# Patient Record
Sex: Female | Born: 1970
Health system: Southern US, Community
[De-identification: ages and names within clinical notes are randomized; demographics above are authoritative.]

## PROBLEM LIST (undated history)

## (undated) DIAGNOSIS — M545 Low back pain, unspecified: Secondary | ICD-10-CM

## (undated) DIAGNOSIS — M51369 Other intervertebral disc degeneration, lumbar region without mention of lumbar back pain or lower extremity pain: Secondary | ICD-10-CM

## (undated) DIAGNOSIS — E785 Hyperlipidemia, unspecified: Secondary | ICD-10-CM

## (undated) DIAGNOSIS — K2270 Barrett's esophagus without dysplasia: Secondary | ICD-10-CM

## (undated) DIAGNOSIS — F329 Major depressive disorder, single episode, unspecified: Secondary | ICD-10-CM

## (undated) DIAGNOSIS — E119 Type 2 diabetes mellitus without complications: Secondary | ICD-10-CM

## (undated) DIAGNOSIS — E039 Hypothyroidism, unspecified: Secondary | ICD-10-CM

## (undated) DIAGNOSIS — F4310 Post-traumatic stress disorder, unspecified: Secondary | ICD-10-CM

## (undated) DIAGNOSIS — I2510 Atherosclerotic heart disease of native coronary artery without angina pectoris: Secondary | ICD-10-CM

## (undated) DIAGNOSIS — I469 Cardiac arrest, cause unspecified: Secondary | ICD-10-CM

## (undated) DIAGNOSIS — F32A Depression, unspecified: Secondary | ICD-10-CM

## (undated) DIAGNOSIS — M5126 Other intervertebral disc displacement, lumbar region: Secondary | ICD-10-CM

## (undated) DIAGNOSIS — F419 Anxiety disorder, unspecified: Secondary | ICD-10-CM

## (undated) DIAGNOSIS — Z765 Malingerer [conscious simulation]: Secondary | ICD-10-CM

## (undated) DIAGNOSIS — C4490 Unspecified malignant neoplasm of skin, unspecified: Secondary | ICD-10-CM

## (undated) DIAGNOSIS — G8929 Other chronic pain: Secondary | ICD-10-CM

## (undated) DIAGNOSIS — R519 Headache, unspecified: Secondary | ICD-10-CM

## (undated) DIAGNOSIS — R51 Headache: Secondary | ICD-10-CM

## (undated) DIAGNOSIS — M1990 Unspecified osteoarthritis, unspecified site: Secondary | ICD-10-CM

## (undated) DIAGNOSIS — I10 Essential (primary) hypertension: Secondary | ICD-10-CM

## (undated) DIAGNOSIS — M5030 Other cervical disc degeneration, unspecified cervical region: Secondary | ICD-10-CM

## (undated) DIAGNOSIS — M5136 Other intervertebral disc degeneration, lumbar region: Secondary | ICD-10-CM

## (undated) DIAGNOSIS — K219 Gastro-esophageal reflux disease without esophagitis: Secondary | ICD-10-CM

## (undated) HISTORY — PX: HEMORRHOID SURGERY: SHX153

---

## 1997-08-17 ENCOUNTER — Inpatient Hospital Stay (HOSPITAL_COMMUNITY): Admission: AD | Admit: 1997-08-17 | Discharge: 1997-08-21 | Payer: Self-pay | Admitting: Obstetrics and Gynecology

## 1997-08-21 ENCOUNTER — Encounter: Admission: RE | Admit: 1997-08-21 | Discharge: 1997-11-19 | Payer: Self-pay | Admitting: *Deleted

## 1999-03-31 ENCOUNTER — Ambulatory Visit (HOSPITAL_COMMUNITY): Admission: RE | Admit: 1999-03-31 | Discharge: 1999-03-31 | Payer: Self-pay | Admitting: Psychology

## 1999-03-31 ENCOUNTER — Encounter: Payer: Self-pay | Admitting: Obstetrics and Gynecology

## 1999-06-30 ENCOUNTER — Other Ambulatory Visit: Admission: RE | Admit: 1999-06-30 | Discharge: 1999-06-30 | Payer: Self-pay | Admitting: Obstetrics and Gynecology

## 1999-07-27 ENCOUNTER — Encounter: Payer: Self-pay | Admitting: Obstetrics and Gynecology

## 1999-07-27 ENCOUNTER — Ambulatory Visit (HOSPITAL_COMMUNITY): Admission: RE | Admit: 1999-07-27 | Discharge: 1999-07-27 | Payer: Self-pay | Admitting: Obstetrics and Gynecology

## 2000-02-12 ENCOUNTER — Encounter: Payer: Self-pay | Admitting: Obstetrics and Gynecology

## 2000-02-12 ENCOUNTER — Ambulatory Visit (HOSPITAL_COMMUNITY): Admission: RE | Admit: 2000-02-12 | Discharge: 2000-02-12 | Payer: Self-pay | Admitting: Obstetrics and Gynecology

## 2000-07-17 ENCOUNTER — Encounter (INDEPENDENT_AMBULATORY_CARE_PROVIDER_SITE_OTHER): Payer: Self-pay | Admitting: *Deleted

## 2000-07-17 ENCOUNTER — Inpatient Hospital Stay (HOSPITAL_COMMUNITY): Admission: AD | Admit: 2000-07-17 | Discharge: 2000-07-19 | Payer: Self-pay | Admitting: Obstetrics and Gynecology

## 2000-08-28 ENCOUNTER — Other Ambulatory Visit: Admission: RE | Admit: 2000-08-28 | Discharge: 2000-08-28 | Payer: Self-pay | Admitting: Obstetrics and Gynecology

## 2000-09-10 ENCOUNTER — Ambulatory Visit (HOSPITAL_BASED_OUTPATIENT_CLINIC_OR_DEPARTMENT_OTHER): Admission: RE | Admit: 2000-09-10 | Discharge: 2000-09-10 | Payer: Self-pay | Admitting: *Deleted

## 2000-09-11 ENCOUNTER — Emergency Department (HOSPITAL_COMMUNITY): Admission: EM | Admit: 2000-09-11 | Discharge: 2000-09-11 | Payer: Self-pay | Admitting: Emergency Medicine

## 2001-10-10 ENCOUNTER — Other Ambulatory Visit: Admission: RE | Admit: 2001-10-10 | Discharge: 2001-10-10 | Payer: Self-pay | Admitting: Obstetrics and Gynecology

## 2002-10-22 ENCOUNTER — Other Ambulatory Visit: Admission: RE | Admit: 2002-10-22 | Discharge: 2002-10-22 | Payer: Self-pay | Admitting: Obstetrics and Gynecology

## 2004-11-17 ENCOUNTER — Other Ambulatory Visit: Admission: RE | Admit: 2004-11-17 | Discharge: 2004-11-17 | Payer: Self-pay | Admitting: Obstetrics and Gynecology

## 2006-04-23 HISTORY — PX: ABLATION ON ENDOMETRIOSIS: SHX5787

## 2006-08-22 ENCOUNTER — Encounter (INDEPENDENT_AMBULATORY_CARE_PROVIDER_SITE_OTHER): Payer: Self-pay | Admitting: Specialist

## 2006-08-22 ENCOUNTER — Ambulatory Visit (HOSPITAL_COMMUNITY): Admission: RE | Admit: 2006-08-22 | Discharge: 2006-08-22 | Payer: Self-pay | Admitting: Obstetrics and Gynecology

## 2008-05-20 ENCOUNTER — Encounter
Admission: RE | Admit: 2008-05-20 | Discharge: 2008-05-21 | Payer: Self-pay | Admitting: Physical Medicine and Rehabilitation

## 2008-05-21 ENCOUNTER — Ambulatory Visit: Payer: Self-pay | Admitting: Physical Medicine and Rehabilitation

## 2009-01-31 ENCOUNTER — Emergency Department (HOSPITAL_COMMUNITY): Admission: EM | Admit: 2009-01-31 | Discharge: 2009-01-31 | Payer: Self-pay | Admitting: Emergency Medicine

## 2009-02-07 ENCOUNTER — Ambulatory Visit (HOSPITAL_COMMUNITY): Admission: RE | Admit: 2009-02-07 | Discharge: 2009-02-07 | Payer: Self-pay | Admitting: Family Medicine

## 2009-11-06 ENCOUNTER — Emergency Department (HOSPITAL_COMMUNITY): Admission: EM | Admit: 2009-11-06 | Discharge: 2009-11-07 | Payer: Self-pay | Admitting: Emergency Medicine

## 2010-05-14 ENCOUNTER — Encounter: Payer: Self-pay | Admitting: Family Medicine

## 2010-05-15 ENCOUNTER — Encounter: Payer: Self-pay | Admitting: Obstetrics and Gynecology

## 2010-07-08 LAB — URINALYSIS, ROUTINE W REFLEX MICROSCOPIC
Nitrite: NEGATIVE
Protein, ur: 30 mg/dL — AB
Urobilinogen, UA: 0.2 mg/dL (ref 0.0–1.0)

## 2010-07-08 LAB — DIFFERENTIAL
Eosinophils Absolute: 0 10*3/uL (ref 0.0–0.7)
Lymphocytes Relative: 3 % — ABNORMAL LOW (ref 12–46)
Lymphs Abs: 0.4 10*3/uL — ABNORMAL LOW (ref 0.7–4.0)
Monocytes Absolute: 0.6 10*3/uL (ref 0.1–1.0)
Neutrophils Relative %: 93 % — ABNORMAL HIGH (ref 43–77)

## 2010-07-08 LAB — URINE MICROSCOPIC-ADD ON

## 2010-07-08 LAB — CBC
MCV: 95.4 fL (ref 78.0–100.0)
RBC: 4.35 MIL/uL (ref 3.87–5.11)
RDW: 11.7 % (ref 11.5–15.5)

## 2010-07-08 LAB — COMPREHENSIVE METABOLIC PANEL
ALT: <8 U/L (ref 0–35)
AST: 43 U/L — ABNORMAL HIGH (ref 0–37)
Alkaline Phosphatase: 47 U/L (ref 39–117)
CO2: 22 mEq/L (ref 19–32)
Calcium: 10.1 mg/dL (ref 8.4–10.5)
Creatinine, Ser: 1.07 mg/dL (ref 0.4–1.2)
GFR calc Af Amer: >60 mL/min (ref >60)
Glucose, Bld: 211 mg/dL — ABNORMAL HIGH (ref 70–99)
Potassium: 3.4 mEq/L — ABNORMAL LOW (ref 3.5–5.1)
Sodium: 145 mEq/L (ref 135–145)
Total Protein: 8.2 g/dL (ref 6.0–8.3)

## 2010-07-08 LAB — SALICYLATE LEVEL: Salicylate Lvl: <4 mg/dL (ref 2.8–20.0)

## 2010-07-08 LAB — RAPID URINE DRUG SCREEN, HOSP PERFORMED: Benzodiazepines: POSITIVE — AB

## 2010-07-08 LAB — ETHANOL: Alcohol, Ethyl (B): <5 mg/dL (ref 0–10)
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
## 2010-09-08 NOTE — H&P (Signed)
[name], [name]               ACCOUNT NO.:  [id number]   MEDICAL RECORD NO.:  [id number]          PATIENT TYPE:  AMB   LOCATION:  [place]                           FACILITY:  [facility]   PHYSICIAN:  Katharine Look A. Rivard, M.D. DATE OF BIRTH:  Jan 08, 1971   DATE OF ADMISSION:  08/22/2006  DATE OF DISCHARGE:                              HISTORY & PHYSICAL   HISTORY OF PRESENT ILLNESS:  Ms. Lucking is a 40 year old married white  female, para 2-0-0-2, who presents for laparoscopy because of an ovarian  cystectomy and pelvic pain.  Since 2006 the patient reports intermittent  ovarian cysts.  More recently she reports right lower quadrant dull,  achy pelvic/back pain which is constant and worsened around the time of  ovulation.  Pain is rated as a 7/10 on a 10-point pain scale and  decreases with ibuprofen 800 mg.  The patient's symptoms are made worse  by walking and lifting and are somewhat decreased whenever she lies  down.  With each menstrual period the patient also notices an increase  in her pain such that she requires Vicodin for relief.  Her actual  menstrual flow, however, lasts only 6 days and requires two pads daily.  She admits to dyspareunia, urinary hesitancy and constipation, but  denies fever, nausea, vomiting, diarrhea, vaginitis symptoms or  hematuria.  A pelvic ultrasound in March 2008 revealed a solid left  ovarian mass measuring 3 x 2.8 x 3.2 cm with normal color Doppler flow  (questionable fibroma).  A CA-125, CAE, CBC, basic metabolic panel, and  LDH were all within normal limits.  Due to the discomfort as well as the  radiographic findings associated with her left ovary, the patient wishes  to proceed with surgical management of her symptoms.   PAST MEDICAL HISTORY:  OB history:  Gravida 2, para 2-0-0-2.  The  patient delivered vaginally on each occasion.   GYN history:  Menarche 40 years old.  Last menstrual period June 30, 2006.  The patient's periods typically occur  every other month.  She  denies any history of sexually transmitted diseases or abnormal Pap  smears.  Last normal Pap smear was February 2008.   Medical history:  Depression and anxiety.   Surgical history:  2002, hemorrhoid surgery.  She denies any problem  with anesthesia or history of blood transfusion.   FAMILY HISTORY:  Thyroid disease, cancer, hypertension, diabetes,  cardiovascular disease.  The patient denies tobacco use or alcohol.   CURRENT MEDICATIONS:  1. Paxil 10 mg one tablet daily.  2. Vicodin two tablets every four hours as needed for pain.  3. Fish oil one tablet daily.   She has no known drug allergies.   SOCIAL HISTORY:  The patient is married and she works for [employer].   REVIEW OF SYSTEMS:  The patient denies chest pain, shortness of breath,  headaches, vision changes, and except as is mentioned in history of  present illness, the patient's review of systems is negative.   PHYSICAL EXAMINATION:  VITAL SIGNS:  Blood pressure 100/70, pulse is 96,  weight is 134,  height is 5 feet 7-1/2 inches tall.  NECK:  Supple without masses.  There is no thyromegaly or cervical  adenopathy.  HEART:  Regular rate and rhythm.  LUNGS:  Clear.  BACK:  No CVA tenderness.  ABDOMEN:  There is lower quadrant tenderness bilaterally, however no  guarding or rebound.  There is no organomegaly or masses.  EXTREMITIES:  No clubbing, cyanosis, or edema.  PELVIC:  EG, BUS is within normal limits.  Vagina is rugous.  Cervix is  nontender without lesions.  Uterus appears normal size, shape and  consistency.  Adnexa:  There is bilateral tenderness, however no  palpable masses.   Urine pregnancy test is negative.   IMPRESSION:  1. Left ovarian mass.  2. Pelvic pain.   DISPOSITION:  A discussion was held with the patient regarding the  indications for her procedure along with its risks, which include but  are not limited to reaction to anesthesia, damage to adjacent organs,   infection and excessive bleeding.  The patient has consented to proceed  with laparoscopic left ovarian mass removal at [facility] on Aug 22, 2006, at 9 a.m.      [place] J. Elizebeth Koller.      Dede Query Rivard, M.D.  Electronically Signed    EJP/MEDQ  D:  08/19/2006  T:  08/19/2006  Job:  [id number]

## 2010-09-08 NOTE — Op Note (Signed)
[place]  Patient:    [name], [name]                        MRN: [id number] Proc. Date: 09/10/00 Attending:  Cornelia Copa, M.D.                           Operative Report  PREOPERATIVE DIAGNOSIS:  Grade 2 internal and external hemorrhoids.  POSTOPERATIVE DIAGNOSIS:  Grade 2 internal and external hemorrhoids.  OPERATION PERFORMED:  Three-quadrant complex hemorrhoidectomy.  SURGEON:  Harlene Ramus, M.D.  ANESTHESIA:  General.  DESCRIPTION OF PROCEDURE:  The patient was taken to the operating room and placed in supine position.  After adequate anesthesia was induced using endotracheal tube, the patient was placed in the prone jackknife position. Perianal prep using Betadine was undertaken.  Rectal prep was also performed. Using 0.5% Marcaine mixed with Wydase, the right posterior hemorrhoidal bundle was injected.  The perianal skin was grasped and the incision was made around the posterior hemorrhoidal bundle.  The hemorrhoid was dissected off the muscle and clamped at its apex using a Bowie clamp.  The hemorrhoid was removed.  The defect was closed in a muscle to mucosa fashion with a running locking 2-0 chromic suture.  This was performed out to the anoderm.  The perianal skin was then closed with a running 3-0 chromic suture.  Right anterior and left lateral hemorrhoids were removed in the identical fashion.  All perianal tissues were injected using 0.5% Marcaine.  The rectum was packed with Gelfoam packing.  Sterile dressing was applied.  The patient tolerated the procedure well and went to PACU in good condition. DD:  09/10/00 TD:  09/10/00 Job: [id number]

## 2010-09-08 NOTE — Discharge Summary (Signed)
[place]  Patient:    [name], [name]                      MRN: [id number] Adm. Date:  [id number] Disc. Date: [id number] Attending:  Arta Silence                           Discharge Summary  HISTORY:                      A 40 year old white female, para 1-0-0-1, gravida 2, estimated gestational age 58+ weeks by 11-week ultrasound with [facility] July 12, 2000 presenting complaining of regular contractions, no vaginal bleeding, or ruptured membranes. Good fetal movement. Blood group and type O positive with a negative antibody, RPR nonreactive, rubella immune, hepatitis B surface antigen negative. Pap smear normal. GC and Chlamydia negative. One-hour glucola 106. Group B strep negative. Triple screen was declined. Prenatal course was essentially uncomplicated.  OBSTETRIC HISTORY:            In 1999, forceps delivery at 54 weeks, 5-pound 11-ounce infant.  PAST MEDICAL HISTORY:         Negative.  PAST SURGICAL HISTORY:        Negative.  ALLERGIES:                    No known drug allergies.  MEDICATIONS:                  None.  SOCIAL HISTORY:               Married, without tobacco.  HOSPITAL COURSE:              On admission, she was afebrile with normal vital signs. Fetal heart tones were reassuring but not reactive; no significant decelerations; good long-term variability. Contractions were every five to seven minutes. The abdomen was gravid, nontender. Dr. Willis Modena estimated the fetal weight at 7 to 7-1/2 pounds. The cervix was 3 cm, 80%, vertex at a -1, adequate pelvis. Artificial rupture of the membranes showed light meconium.  His impressions on admission were intrauterine pregnancy at 40 weeks in early labor. Artificial rupture of the membranes was done for augmentation with light meconium found. By 8:20 a.m. the contractions had increased in severity since the artificial rupture of the membranes. The patient requested  an epidural. By 9:18 a.m. the patient had received her epidural. The contractions were regular, cervix 4+ cm, 90%, vertex at a -1. By 1:24 p.m. the cervix was 10 cm, a silver dollar of caput was showing. Temperature was 100.6. The patient was offered forceps and declined. Later, she wanted to proceed with the forceps delivery. She was given an epidural boost for a forceps delivery and subsequently delivered spontaneously before forceps were applied, LOA over a second-degree midline laceration by Dr. Ulanda Edison, a living female infant, 8 pounds 1 ounce, Apgars of 9 at one and 9 at five minutes. DeLee and bulb were used on the perineum. Two cc of clear fluid was obtained. The placenta was intact. The uterus was normal, rectal negative. Midline laceration was repaired with 3-0 Dexon. Blood loss was about 400 cc.  Postpartum the patient did quite well and was discharged on the second postpartum day. Her main complaint was hemorrhoidal discomfort.  LABORATORY DATA:              RPR was nonreactive, hemoglobin 9.1,  hematocrit 27, white count 8600, platelet count 188,000. Followup hemoglobin 8.9, hematocrit 25.9, white count 13,300, platelet count 151,000.  FINAL DIAGNOSES:              Intrauterine pregnancy at 40+ weeks, delivered                               left occiput anterior.  OPERATIONS:                   1. Spontaneous delivery, left occiput anterior.                               2. Repair of second-degree midline laceration.  FINAL CONDITION:              Improved.  DISCHARGE INSTRUCTIONS:       Instructions include our regular discharge instruction booklet.  DISCHARGE FOLLOWUP:           The patient is advised to return to the office in six weeks for followup examination.  DISCHARGE MEDICATIONS:        She is given a prescription for Percocet 5/325, 16 tablets, one every four to six hours as needed for pain. DD:  07/19/00 TD:  07/20/00 Job: [id number]

## 2010-09-08 NOTE — Op Note (Signed)
NAMEBAYLEN, SCHOPF               ACCOUNT NO.:  [id number]   MEDICAL RECORD NO.:  [id number]          PATIENT TYPE:  AMB   LOCATION:  [place]                           FACILITY:  [facility]   PHYSICIAN:  Katharine Look A. Rivard, M.D. DATE OF BIRTH:  03-07-1971   DATE OF PROCEDURE:  08/22/2006  DATE OF DISCHARGE:                               OPERATIVE REPORT   PREOPERATIVE DIAGNOSIS:  Left ovarian mass with pelvic pain and  dyspareunia.   POSTOPERATIVE DIAGNOSIS:  Pelvic pain and dyspareunia with pelvic  adhesions and stigmata consistent with endometriosis.   ANESTHESIA:  General.   PROCEDURE:  Laparoscopy with lysis of adhesions, peritoneal biopsy and  ablation of endometriosis.   SURGEON:  Dede Query. Rivard, M.D.   ASSISTANT:  Earnstine Regal, 58 assistant.   ESTIMATED BLOOD LOSS:  Minimal.   PROCEDURE:  After being informed of the planned procedure with possible  complications including bleeding, infection, injury to bowel, bladder or  ureters, need for laparotomy and need for oophorectomy, informed consent  was obtained.  The patient is taken to OR #8 given general anesthesia  with endotracheal intubation without any complication.  She is placed in  a lithotomy position, prepped and draped in a sterile fashion.  A  speculum is inserted, anterior lip of the cervix was grasped with a  tenaculum, forceps and an acorn intrauterine manipulator is placed  without difficulty.  Speculum is removed.   The umbilical area is infiltrated with 5 mL of Marcaine 0.25 and we  performed a semi elliptical incision which is brought down sharply to  the fascia.  Fascia is identified, grasped with Kocher forceps, incised  with Mayo scissors and peritoneum is entered bluntly.  A pursestring  suture of 0-0 Vicryl is placed on the fascia and a Hassan 10 mm trocar  is easily inserted, held in place with pursestring suture.  We can now  insufflate a pneumoperitoneum with CO2 at a maximum pressure of 15  mmHg.   Microscope was inserted.  Observation:  Anterior cul-de-sac is normal.  Uterus is normal.  The right tube is normal.  Right ovary is normal.  Left tube is normal.  Left ovary is normal.  There is no evidence of any  mass or cyst on the ovaries.  Posterior cul-de-sac near the left  uterosacral ligament reveals three spots measuring 1-2 mm brownish  lesions with some peritoneal retraction compatible with endometriosis.  Under the left ovary there is also right above the uterosacral ligaments  three retraction spots and white lesions compatible with endometriosis.  The appendix is visualized and normal.  The liver is normal.  Gallbladder appears normal although somewhat under tension and the  stomach is normal.  We do see some mild grade 1 and 2 adhesions between  the sigmoid colon and the left pelvic wall which does appear that the  bowel under traction.  Please ratify it is on the left uterosacral  ligament and under the left ovary that we see the peritoneal lesions  compatible with endometriosis.   A 5 mm suprapubic trocar is inserted under direct visualization and  we  proceed with two peritoneal biopsies one in the posterior cul-de-sac and  one above the left uterosacral ligament while we have the ureter under  direct visualization.  These biopsies are identified and sent to  pathology.  We then proceed with cauterization using bipolar  cauterization of all the previously described lesions again away from  the ureter.  Using sharp scissors.  We then proceed with sharp  dissection of the adhesions previously mentioned so there is complete  release of the sigmoid colon.  We now irrigate the area of lysis of  adhesions as well as the posterior cul-de-sac.  Hemostasis is adequate.   The 5-mm trocar is then removed under direct visualization.  Instruments  were removed.  Pneumoperitoneum is evacuated.   The fascia of a 10 mm incision is closed with the previously placed   pursestring suture ensuring no bowel loop is protruding.  Skin is closed  with subcuticular suture of 4-0 Monocryl and Steri-Strips.   The tenaculum and intrauterine manipulator are removed and a bleeding  site of the cervix is controlled with three figure-of-eight stitches of  2-0 chromic.   Instrument and sponge count is complete x2.  Estimated blood loss is  minimal.  The procedure is very well tolerated by the patient who is  taken to recovery room in a well and stable condition.      Dede Query Rivard, M.D.  Electronically Signed     SAR/MEDQ  D:  08/22/2006  T:  08/22/2006  Job:  [id number]

## 2010-11-13 ENCOUNTER — Other Ambulatory Visit: Payer: Self-pay | Admitting: Family Medicine

## 2010-11-13 DIAGNOSIS — E049 Nontoxic goiter, unspecified: Secondary | ICD-10-CM

## 2010-11-15 ENCOUNTER — Other Ambulatory Visit: Payer: Self-pay

## 2010-11-17 ENCOUNTER — Other Ambulatory Visit: Payer: Self-pay

## 2010-12-13 ENCOUNTER — Other Ambulatory Visit: Payer: Self-pay

## 2011-08-28 ENCOUNTER — Ambulatory Visit: Payer: Self-pay | Admitting: Obstetrics and Gynecology

## 2013-10-26 ENCOUNTER — Other Ambulatory Visit: Payer: Self-pay | Admitting: Family Medicine

## 2013-10-26 DIAGNOSIS — R102 Pelvic and perineal pain: Secondary | ICD-10-CM

## 2013-10-28 ENCOUNTER — Other Ambulatory Visit: Payer: Self-pay

## 2013-11-02 ENCOUNTER — Other Ambulatory Visit: Payer: Self-pay

## 2014-08-31 ENCOUNTER — Emergency Department (HOSPITAL_COMMUNITY): Payer: Medicaid Other

## 2014-08-31 ENCOUNTER — Encounter (HOSPITAL_COMMUNITY): Payer: Self-pay | Admitting: Emergency Medicine

## 2014-08-31 ENCOUNTER — Inpatient Hospital Stay (HOSPITAL_COMMUNITY)
Admission: EM | Admit: 2014-08-31 | Discharge: 2014-09-05 | DRG: 872 | Disposition: A | Discharge status: Home / Self Care | Payer: Medicaid Other | Attending: Internal Medicine | Admitting: Internal Medicine

## 2014-08-31 DIAGNOSIS — M5030 Other cervical disc degeneration, unspecified cervical region: Secondary | ICD-10-CM | POA: Diagnosis present

## 2014-08-31 DIAGNOSIS — A09 Infectious gastroenteritis and colitis, unspecified: Secondary | ICD-10-CM | POA: Diagnosis present

## 2014-08-31 DIAGNOSIS — E872 Acidosis, unspecified: Secondary | ICD-10-CM | POA: Diagnosis present

## 2014-08-31 DIAGNOSIS — I10 Essential (primary) hypertension: Secondary | ICD-10-CM | POA: Diagnosis present

## 2014-08-31 DIAGNOSIS — R1013 Epigastric pain: Secondary | ICD-10-CM

## 2014-08-31 DIAGNOSIS — E860 Dehydration: Secondary | ICD-10-CM | POA: Diagnosis present

## 2014-08-31 DIAGNOSIS — R1084 Generalized abdominal pain: Secondary | ICD-10-CM | POA: Diagnosis present

## 2014-08-31 DIAGNOSIS — R1011 Right upper quadrant pain: Secondary | ICD-10-CM

## 2014-08-31 DIAGNOSIS — R197 Diarrhea, unspecified: Secondary | ICD-10-CM

## 2014-08-31 DIAGNOSIS — F4310 Post-traumatic stress disorder, unspecified: Secondary | ICD-10-CM | POA: Diagnosis present

## 2014-08-31 DIAGNOSIS — E870 Hyperosmolality and hypernatremia: Secondary | ICD-10-CM | POA: Diagnosis present

## 2014-08-31 DIAGNOSIS — N809 Endometriosis, unspecified: Secondary | ICD-10-CM | POA: Diagnosis present

## 2014-08-31 DIAGNOSIS — I4711 Inappropriate sinus tachycardia, so stated: Secondary | ICD-10-CM | POA: Diagnosis present

## 2014-08-31 DIAGNOSIS — F329 Major depressive disorder, single episode, unspecified: Secondary | ICD-10-CM | POA: Diagnosis present

## 2014-08-31 DIAGNOSIS — A419 Sepsis, unspecified organism: Principal | ICD-10-CM | POA: Diagnosis present

## 2014-08-31 DIAGNOSIS — N179 Acute kidney failure, unspecified: Secondary | ICD-10-CM | POA: Diagnosis present

## 2014-08-31 DIAGNOSIS — F419 Anxiety disorder, unspecified: Secondary | ICD-10-CM | POA: Diagnosis present

## 2014-08-31 DIAGNOSIS — R6520 Severe sepsis without septic shock: Secondary | ICD-10-CM | POA: Diagnosis present

## 2014-08-31 DIAGNOSIS — R112 Nausea with vomiting, unspecified: Secondary | ICD-10-CM | POA: Diagnosis present

## 2014-08-31 DIAGNOSIS — E876 Hypokalemia: Secondary | ICD-10-CM | POA: Diagnosis not present

## 2014-08-31 DIAGNOSIS — R000 Tachycardia, unspecified: Secondary | ICD-10-CM | POA: Diagnosis present

## 2014-08-31 HISTORY — DX: Other cervical disc degeneration, unspecified cervical region: M50.30

## 2014-08-31 HISTORY — DX: Depression, unspecified: F32.A

## 2014-08-31 HISTORY — DX: Other intervertebral disc displacement, lumbar region: M51.26

## 2014-08-31 HISTORY — DX: Essential (primary) hypertension: I10

## 2014-08-31 HISTORY — DX: Other intervertebral disc degeneration, lumbar region: M51.36

## 2014-08-31 HISTORY — DX: Other intervertebral disc degeneration, lumbar region without mention of lumbar back pain or lower extremity pain: M51.369

## 2014-08-31 HISTORY — DX: Major depressive disorder, single episode, unspecified: F32.9

## 2014-08-31 HISTORY — DX: Post-traumatic stress disorder, unspecified: F43.10

## 2014-08-31 LAB — CBC WITH DIFFERENTIAL/PLATELET
BASOS PCT: 0 % (ref 0–1)
Basophils Absolute: 0 10*3/uL (ref 0.0–0.1)
EOS ABS: 0 10*3/uL (ref 0.0–0.7)
EOS PCT: 0 % (ref 0–5)
HEMATOCRIT: 42.3 % (ref 36.0–46.0)
HEMOGLOBIN: 14.5 g/dL (ref 12.0–15.0)
Lymphocytes Relative: 8 % — ABNORMAL LOW (ref 12–46)
Lymphs Abs: 2.1 10*3/uL (ref 0.7–4.0)
MCH: 32.4 pg (ref 26.0–34.0)
MCHC: 34.3 g/dL (ref 30.0–36.0)
MCV: 94.4 fL (ref 78.0–100.0)
MONO ABS: 2 10*3/uL — AB (ref 0.1–1.0)
MONOS PCT: 8 % (ref 3–12)
NEUTROS PCT: 84 % — AB (ref 43–77)
Neutro Abs: 21.2 10*3/uL — ABNORMAL HIGH (ref 1.7–7.7)
Platelets: 350 10*3/uL (ref 150–400)
RBC: 4.48 MIL/uL (ref 3.87–5.11)
RDW: 12.5 % (ref 11.5–15.5)
WBC: 25.3 10*3/uL — ABNORMAL HIGH (ref 4.0–10.5)

## 2014-08-31 LAB — URINALYSIS, ROUTINE W REFLEX MICROSCOPIC
BILIRUBIN URINE: NEGATIVE
GLUCOSE, UA: NEGATIVE mg/dL
Ketones, ur: 40 mg/dL — AB
Leukocytes, UA: NEGATIVE
NITRITE: NEGATIVE
PH: 5 (ref 5.0–8.0)
Protein, ur: 30 mg/dL — AB
SPECIFIC GRAVITY, URINE: 1.017 (ref 1.005–1.030)
Urobilinogen, UA: 0.2 mg/dL (ref 0.0–1.0)

## 2014-08-31 LAB — I-STAT CHEM 8, ED
BUN: 4 mg/dL — ABNORMAL LOW (ref 6–20)
Calcium, Ion: 1.07 mmol/L — ABNORMAL LOW (ref 1.12–1.23)
Chloride: 111 mmol/L (ref 101–111)
Creatinine, Ser: 0.8 mg/dL (ref 0.44–1.00)
Glucose, Bld: 148 mg/dL — ABNORMAL HIGH (ref 70–99)
HCT: 48 % — ABNORMAL HIGH (ref 36.0–46.0)
Hemoglobin: 16.3 g/dL — ABNORMAL HIGH (ref 12.0–15.0)
Potassium: 3.8 mmol/L (ref 3.5–5.1)
Sodium: 146 mmol/L — ABNORMAL HIGH (ref 135–145)
TCO2: 12 mmol/L (ref 0–100)

## 2014-08-31 LAB — COMPREHENSIVE METABOLIC PANEL
ALBUMIN: 4.1 g/dL (ref 3.5–5.0)
ALK PHOS: 88 U/L (ref 38–126)
ALT: <5 U/L — ABNORMAL LOW (ref 14–54)
AST: 49 U/L — AB (ref 15–41)
Anion gap: 23 — ABNORMAL HIGH (ref 5–15)
BILIRUBIN TOTAL: 0.5 mg/dL (ref 0.3–1.2)
BUN: 6 mg/dL (ref 6–20)
CHLORIDE: 108 mmol/L (ref 101–111)
CO2: 12 mmol/L — ABNORMAL LOW (ref 22–32)
Calcium: 9.2 mg/dL (ref 8.9–10.3)
Creatinine, Ser: 1.14 mg/dL — ABNORMAL HIGH (ref 0.44–1.00)
GFR calc Af Amer: >60 mL/min (ref >60)
GFR calc non Af Amer: 58 mL/min — ABNORMAL LOW (ref >60)
Glucose, Bld: 163 mg/dL — ABNORMAL HIGH (ref 70–99)
POTASSIUM: 3.4 mmol/L — AB (ref 3.5–5.1)
Sodium: 143 mmol/L (ref 135–145)
Total Protein: 7.8 g/dL (ref 6.5–8.1)

## 2014-08-31 LAB — PREGNANCY, URINE: Preg Test, Ur: NEGATIVE

## 2014-08-31 LAB — URINE MICROSCOPIC-ADD ON

## 2014-08-31 LAB — I-STAT CG4 LACTIC ACID, ED: LACTIC ACID, VENOUS: 9.99 mmol/L — AB (ref 0.5–2.0)

## 2014-08-31 LAB — I-STAT BETA HCG BLOOD, ED (MC, WL, AP ONLY): I-stat hCG, quantitative: <5 m[IU]/mL

## 2014-08-31 MED ORDER — SODIUM CHLORIDE 0.9 % IV BOLUS (SEPSIS)
2000.0000 mL | Freq: Once | INTRAVENOUS | Status: AC
  Administered 2014-08-31: 2000 mL via INTRAVENOUS

## 2014-08-31 MED ORDER — PROMETHAZINE HCL 25 MG/ML IJ SOLN
25.0000 mg | Freq: Once | INTRAMUSCULAR | Status: AC
  Administered 2014-08-31: 25 mg via INTRAVENOUS
  Filled 2014-08-31: qty 1

## 2014-08-31 MED ORDER — ACETAMINOPHEN 650 MG RE SUPP
650.0000 mg | Freq: Once | RECTAL | Status: AC
  Administered 2014-08-31: 650 mg via RECTAL
  Filled 2014-08-31: qty 1

## 2014-08-31 MED ORDER — HYDROMORPHONE HCL 1 MG/ML IJ SOLN
1.0000 mg | Freq: Once | INTRAMUSCULAR | Status: AC
  Administered 2014-08-31: 1 mg via INTRAVENOUS
  Filled 2014-08-31: qty 1

## 2014-08-31 MED ORDER — SODIUM CHLORIDE 0.9 % IV BOLUS (SEPSIS)
1000.0000 mL | Freq: Once | INTRAVENOUS | Status: AC
  Administered 2014-08-31: 1000 mL via INTRAVENOUS

## 2014-08-31 MED ORDER — HYDROMORPHONE HCL 1 MG/ML IJ SOLN
1.0000 mg | Freq: Once | INTRAMUSCULAR | Status: AC
Start: 2014-08-31 — End: 2014-08-31
  Administered 2014-08-31: 1 mg via INTRAVENOUS
  Filled 2014-08-31: qty 1

## 2014-08-31 MED ORDER — ONDANSETRON HCL 4 MG/2ML IJ SOLN
4.0000 mg | Freq: Once | INTRAMUSCULAR | Status: AC
  Administered 2014-08-31: 4 mg via INTRAVENOUS
  Filled 2014-08-31: qty 2

## 2014-08-31 MED ORDER — IOHEXOL 300 MG/ML  SOLN: 100.0000 mL | Freq: Once | INTRAMUSCULAR | Status: AC | PRN

## 2014-08-31 MED ORDER — ACETAMINOPHEN 325 MG PO TABS
650.0000 mg | ORAL_TABLET | Freq: Four times a day (QID) | ORAL | Status: DC | PRN
  Filled 2014-08-31: qty 2

## 2014-08-31 NOTE — ED Notes (Signed)
CT called to inquire about when pt can go to CT.

## 2014-08-31 NOTE — ED Provider Notes (Signed)
CSN: [id number]     Arrival date & time 08/31/14  2121 History   First MD Initiated Contact with Patient 08/31/14 2158     Chief Complaint  Patient presents with  . Emesis  . Abdominal Pain  . Diarrhea     (Consider location/radiation/quality/duration/timing/severity/associated sxs/prior Treatment) HPI Pt is a 44yo female with hx of PTSD, DDD, bulging lumbar disc, and depression, brought to ED by EMS from home with reports of sudden onset n/v/d with associated abdominal pain that started earlier this afternoon. Pt reports 10-15 episodes of vomiting and diarrhea with watery diarrhea. No blood or mucous in stool. Pt also report blood streaking in emesis.  Reports hot and cold chills but states she felt well this morning and last night. Pt lives at home with her husband and daughter, neither are sick. Denies eating anything that could have caused these symptoms. Denies recent travel. No previous hx of abdominal issues.  Denies CP or SOB. No cardiac hx. Denies hx of DM.  Pt does have a PCP who she has seen within the past 1 year for a physical.  Pt does report she was tx for a UTI about 3 weeks ago, she does not recall the antibiotic but does report all urinary symptoms completely resolved after tx.  No hx of abdominal surgeries, however, medical records indicate pt has had hernia repair and ablation of endometriosis.   Per Care Everywhere, pt was seen on 08/08/14 at [facility] ER, dx with UTI and prescribed bactrim. Pt was having abdominal pain at that time, however, workup was unremarkable, abdominal pain was believed to be due to pt's endometriosis.   PCP: Dr. Arelia Sneddon, [place]  Past Medical History  Diagnosis Date  . PTSD (post-traumatic stress disorder)   . DDD (degenerative disc disease), cervical   . Bulging lumbar disc   . Depression   . Hypertension    Past Surgical History  Procedure Laterality Date  . Hernia repair    . Ablation on endometriosis     History  reviewed. No pertinent family history. History  Substance Use Topics  . Smoking status: Never Smoker   . Smokeless tobacco: Not on file  . Alcohol Use: No   OB History    No data available     Review of Systems  Constitutional: Positive for fever, chills, appetite change and fatigue. Negative for diaphoresis.  Respiratory: Negative for cough and shortness of breath.   Cardiovascular: Negative for chest pain, palpitations and leg swelling.  Gastrointestinal: Positive for nausea, vomiting, abdominal pain and diarrhea. Negative for constipation, blood in stool and rectal pain.  Genitourinary: Negative for dysuria, frequency, hematuria and flank pain.  All other systems reviewed and are negative.     Allergies  Review of patient's allergies indicates no known allergies.  Home Medications   Prior to Admission medications   Medication Sig Start Date End Date Taking? Authorizing Provider  diazepam (VALIUM) 5 MG tablet Take 5 mg by mouth 3 (three) times daily.   Yes Historical Provider, MD  HYDROcodone-acetaminophen (NORCO) 10-325 MG per tablet Take 1 tablet by mouth every 6 (six) hours as needed for moderate pain.   Yes Historical Provider, MD  tiZANidine (ZANAFLEX) 4 MG tablet Take 4 mg by mouth every 6 (six) hours as needed for muscle spasms.   Yes Historical Provider, MD   BP 186/104 mmHg  Pulse 128  Temp(Src) 99.5 F (37.5 C) (Rectal)  Resp 19  Ht 5\' 4"  (1.626 m)  Wt 160 lb (72.576 kg)  BMI 27.45 kg/m2  SpO2 97%  LMP 08/01/2014 (Approximate) Physical Exam  Constitutional: She appears well-developed and well-nourished. She appears distressed.  Pt lying in exam bed, appears acutely ill, shivering  HENT:  Head: Normocephalic and atraumatic.  Eyes: Conjunctivae are normal. No scleral icterus.  Neck: Normal range of motion. Neck supple.  Cardiovascular: Regular rhythm and normal heart sounds.  Tachycardia present.   Pulmonary/Chest: Effort normal and breath sounds normal.  No respiratory distress. She has no wheezes. She has no rales. She exhibits no tenderness.  Abdominal: Soft. Bowel sounds are normal. She exhibits no distension and no mass. There is tenderness. There is no rebound and no guarding.  Musculoskeletal: Normal range of motion.  Neurological: She is alert.  Skin: Skin is warm. She is diaphoretic.  Nursing note and vitals reviewed.   ED Course  Procedures (including critical care time) Labs Review Labs Reviewed  CBC WITH DIFFERENTIAL/PLATELET - Abnormal; Notable for the following:    WBC 25.3 (*)    Neutrophils Relative % 84 (*)    Neutro Abs 21.2 (*)    Lymphocytes Relative 8 (*)    Monocytes Absolute 2.0 (*)    All other components within normal limits  COMPREHENSIVE METABOLIC PANEL - Abnormal; Notable for the following:    Potassium 3.4 (*)    CO2 12 (*)    Glucose, Bld 163 (*)    Creatinine, Ser 1.14 (*)    AST 49 (*)    ALT <5 (*)    GFR calc non Af Amer 58 (*)    Anion gap 23 (*)    All other components within normal limits  URINALYSIS, ROUTINE W REFLEX MICROSCOPIC - Abnormal; Notable for the following:    APPearance CLOUDY (*)    Hgb urine dipstick TRACE (*)    Ketones, ur 40 (*)    Protein, ur 30 (*)    All other components within normal limits  URINE MICROSCOPIC-ADD ON - Abnormal; Notable for the following:    Squamous Epithelial / LPF FEW (*)    Bacteria, UA FEW (*)    Casts HYALINE CASTS (*)    All other components within normal limits  I-STAT CG4 LACTIC ACID, ED - Abnormal; Notable for the following:    Lactic Acid, Venous 9.99 (*)    All other components within normal limits  I-STAT CHEM 8, ED - Abnormal; Notable for the following:    Sodium 146 (*)    BUN 4 (*)    Glucose, Bld 148 (*)    Calcium, Ion 1.07 (*)    Hemoglobin 16.3 (*)    HCT 48.0 (*)    All other components within normal limits  URINE CULTURE  CULTURE, BLOOD (ROUTINE X 2)  CULTURE, BLOOD (ROUTINE X 2)  PREGNANCY, URINE  URINE RAPID DRUG  SCREEN (HOSP PERFORMED)  I-STAT BETA HCG BLOOD, ED ([facility], [facility], [facility] ONLY)  I-STAT CG4 LACTIC ACID, ED    Imaging Review Ct Abdomen Pelvis W Contrast  09/01/2014   CLINICAL DATA:  Abdominal pain nausea and vomiting since this morning.  EXAM: CT ABDOMEN AND PELVIS WITH CONTRAST  TECHNIQUE: Multidetector CT imaging of the abdomen and pelvis was performed using the standard protocol following bolus administration of intravenous contrast.  CONTRAST:  183mL OMNIPAQUE IOHEXOL 300 MG/ML  SOLN  COMPARISON:  11/22/2013  FINDINGS: Lower chest: Minimal linear opacities in the right lung base likely are atelectatic or scarring.  Hepatobiliary: There are normal appearances  of the liver, gallbladder and bile ducts.  Pancreas: Normal  Spleen: Normal  Adrenals/Urinary Tract: The adrenals and kidneys are normal in appearance. There is no urinary calculus evident. There is no hydronephrosis or ureteral dilatation. Collecting systems and ureters appear unremarkable.  Stomach/Bowel: There are normal appearances of the stomach, small bowel and colon. The appendix is normal.  Vascular/Lymphatic: The abdominal aorta is normal in caliber. There is no atherosclerotic calcification. There is no adenopathy in the abdomen or pelvis.  Reproductive: The uterus and ovaries appear normal  Other: No acute inflammatory changes are evident in the abdomen or pelvis. There is no ascites.  Musculoskeletal: There are no significant musculoskeletal abnormalities.  IMPRESSION: No significant abnormalities.   Electronically Signed   By: Andreas Newport M.D.   On: 09/01/2014 00:29   Dg Chest Portable 1 View  09/01/2014   CLINICAL DATA:  Emesis, diarrhea, abdominal pain, and shortness of breath.  EXAM: PORTABLE CHEST - 1 VIEW  COMPARISON:  None.  FINDINGS: Shallow inspiration. The heart size and mediastinal contours are within normal limits. Both lungs are clear. The visualized skeletal structures are unremarkable.  IMPRESSION: No active disease.    Electronically Signed   By: Lucienne Capers M.D.   On: 09/01/2014 00:12     EKG Interpretation None      MDM   Final diagnoses:  Epigastric pain  Nausea vomiting and diarrhea  Sepsis, due to unspecified organism    Pt is a 44yo female with no significant PMH, presenting to ED with sudden onset abdominal pain, n/v/d that started this afternoon. Pt does meet SIRS criteria. Pt denies recent illness, fever, sick contacts or recent travel.    Labs: lactic acid: 9.99, WBC: 25.3, Na: 143, K: 3.4, Cr: 1.14  Sepsis order set used. Pt started on IV fluids.  UA: unremarkable.  Urine culture and blood cultures sent  With reports of n/v/d. Pt is febrile and tachycardic, will tx empirically for intraabdominal infection. CT abdomen and CXR ordered. Pt denies hx of cough or congestion, lower concern for pneumonia.   Pt started on empiric antibiotic tx with zosyn and vancomycin. Pt given 3L IV fluids  CXR: no active disease  CT abd: no significant abnormalities.   12:40 AM Pt still tachycardic with HR in 130s after 3L IV fluids. Lungs clear. Will give a 4th liter of IV fluids. Pt also requesting more pain medication. Pt has received 2mg  dilaudid. Will give 58mcg fentanyl.  istat lactic acid also reordered.  Pt was given zofran by EMS and phenergan in ED. No vomiting while in ED. Will consult to admit pt for continued sepsis workup and treatment.   Consulted with Dr. Posey Pronto, agreed to have pt admitted to a step-down bed. Order for EKG placed.  Temporary admission orders placed.       Noland Fordyce, PA-C 09/01/14 0102  Tanna Furry, MD 09/06/14 [phone]

## 2014-08-31 NOTE — ED Notes (Signed)
EMS adm 4mg  of zofran.

## 2014-08-31 NOTE — ED Notes (Signed)
MD at bedside. 

## 2014-08-31 NOTE — ED Notes (Signed)
Pt is from home, pt reports abdominal pain, & N/V since this morning. HR 125.

## 2014-09-01 ENCOUNTER — Other Ambulatory Visit (HOSPITAL_COMMUNITY): Payer: Self-pay

## 2014-09-01 DIAGNOSIS — M5030 Other cervical disc degeneration, unspecified cervical region: Secondary | ICD-10-CM | POA: Diagnosis present

## 2014-09-01 DIAGNOSIS — N809 Endometriosis, unspecified: Secondary | ICD-10-CM | POA: Diagnosis present

## 2014-09-01 DIAGNOSIS — E870 Hyperosmolality and hypernatremia: Secondary | ICD-10-CM | POA: Diagnosis present

## 2014-09-01 DIAGNOSIS — E872 Acidosis, unspecified: Secondary | ICD-10-CM | POA: Diagnosis present

## 2014-09-01 DIAGNOSIS — R1013 Epigastric pain: Secondary | ICD-10-CM | POA: Diagnosis not present

## 2014-09-01 DIAGNOSIS — E860 Dehydration: Secondary | ICD-10-CM | POA: Diagnosis present

## 2014-09-01 DIAGNOSIS — I471 Supraventricular tachycardia: Secondary | ICD-10-CM

## 2014-09-01 DIAGNOSIS — A419 Sepsis, unspecified organism: Secondary | ICD-10-CM | POA: Diagnosis present

## 2014-09-01 DIAGNOSIS — E876 Hypokalemia: Secondary | ICD-10-CM | POA: Diagnosis not present

## 2014-09-01 DIAGNOSIS — F4310 Post-traumatic stress disorder, unspecified: Secondary | ICD-10-CM | POA: Diagnosis present

## 2014-09-01 DIAGNOSIS — R112 Nausea with vomiting, unspecified: Secondary | ICD-10-CM | POA: Diagnosis present

## 2014-09-01 DIAGNOSIS — R1084 Generalized abdominal pain: Secondary | ICD-10-CM | POA: Diagnosis present

## 2014-09-01 DIAGNOSIS — I4711 Inappropriate sinus tachycardia, so stated: Secondary | ICD-10-CM | POA: Diagnosis present

## 2014-09-01 DIAGNOSIS — F419 Anxiety disorder, unspecified: Secondary | ICD-10-CM | POA: Diagnosis present

## 2014-09-01 DIAGNOSIS — R000 Tachycardia, unspecified: Secondary | ICD-10-CM | POA: Diagnosis present

## 2014-09-01 DIAGNOSIS — F329 Major depressive disorder, single episode, unspecified: Secondary | ICD-10-CM | POA: Diagnosis present

## 2014-09-01 DIAGNOSIS — R6520 Severe sepsis without septic shock: Secondary | ICD-10-CM

## 2014-09-01 DIAGNOSIS — A09 Infectious gastroenteritis and colitis, unspecified: Secondary | ICD-10-CM | POA: Diagnosis present

## 2014-09-01 DIAGNOSIS — I10 Essential (primary) hypertension: Secondary | ICD-10-CM | POA: Diagnosis present

## 2014-09-01 DIAGNOSIS — R197 Diarrhea, unspecified: Secondary | ICD-10-CM

## 2014-09-01 DIAGNOSIS — N179 Acute kidney failure, unspecified: Secondary | ICD-10-CM | POA: Diagnosis present

## 2014-09-01 LAB — INFLUENZA PANEL BY PCR (TYPE A & B)
H1N1 flu by pcr: NOT DETECTED
Influenza A By PCR: NEGATIVE
Influenza B By PCR: NEGATIVE

## 2014-09-01 LAB — CBC WITH DIFFERENTIAL/PLATELET
BASOS PCT: 0 % (ref 0–1)
Basophils Absolute: 0 10*3/uL (ref 0.0–0.1)
Eosinophils Absolute: 0 10*3/uL (ref 0.0–0.7)
Eosinophils Relative: 0 % (ref 0–5)
HCT: 39.5 % (ref 36.0–46.0)
Hemoglobin: 13.3 g/dL (ref 12.0–15.0)
Lymphocytes Relative: 4 % — ABNORMAL LOW (ref 12–46)
Lymphs Abs: 0.7 10*3/uL (ref 0.7–4.0)
MCH: 31.9 pg (ref 26.0–34.0)
MCHC: 33.7 g/dL (ref 30.0–36.0)
MCV: 94.7 fL (ref 78.0–100.0)
Monocytes Absolute: 1 10*3/uL (ref 0.1–1.0)
Monocytes Relative: 6 % (ref 3–12)
NEUTROS PCT: 90 % — AB (ref 43–77)
Neutro Abs: 16 10*3/uL — ABNORMAL HIGH (ref 1.7–7.7)
Platelets: 221 10*3/uL (ref 150–400)
RBC: 4.17 MIL/uL (ref 3.87–5.11)
RDW: 12.8 % (ref 11.5–15.5)
WBC: 17.8 10*3/uL — ABNORMAL HIGH (ref 4.0–10.5)

## 2014-09-01 LAB — RAPID URINE DRUG SCREEN, HOSP PERFORMED
Amphetamines: NOT DETECTED
BARBITURATES: NOT DETECTED
Benzodiazepines: POSITIVE — AB
Cocaine: NOT DETECTED
Opiates: POSITIVE — AB
Tetrahydrocannabinol: NOT DETECTED

## 2014-09-01 LAB — LIPASE, BLOOD: LIPASE: 15 U/L — AB (ref 22–51)

## 2014-09-01 LAB — GLUCOSE, CAPILLARY: Glucose-Capillary: 132 mg/dL — ABNORMAL HIGH (ref 70–99)

## 2014-09-01 LAB — COMPREHENSIVE METABOLIC PANEL
ALT: 24 U/L (ref 14–54)
ANION GAP: 15 (ref 5–15)
AST: 44 U/L — AB (ref 15–41)
Albumin: 3.7 g/dL (ref 3.5–5.0)
Alkaline Phosphatase: 72 U/L (ref 38–126)
BUN: <5 mg/dL — ABNORMAL LOW (ref 6–20)
CHLORIDE: 112 mmol/L — AB (ref 101–111)
CO2: 15 mmol/L — ABNORMAL LOW (ref 22–32)
CREATININE: 0.91 mg/dL (ref 0.44–1.00)
Calcium: 8.1 mg/dL — ABNORMAL LOW (ref 8.9–10.3)
Glucose, Bld: 133 mg/dL — ABNORMAL HIGH (ref 70–99)
Potassium: 4 mmol/L (ref 3.5–5.1)
SODIUM: 142 mmol/L (ref 135–145)
Total Bilirubin: 0.4 mg/dL (ref 0.3–1.2)
Total Protein: 7.2 g/dL (ref 6.5–8.1)

## 2014-09-01 LAB — MRSA PCR SCREENING: MRSA by PCR: NEGATIVE

## 2014-09-01 LAB — PROCALCITONIN: Procalcitonin: 0.51 ng/mL

## 2014-09-01 LAB — I-STAT CG4 LACTIC ACID, ED: Lactic Acid, Venous: 7.24 mmol/L (ref 0.5–2.0)

## 2014-09-01 LAB — LACTIC ACID, PLASMA
Lactic Acid, Venous: 5.7 mmol/L (ref 0.5–2.0)
Lactic Acid, Venous: 2.1 mmol/L (ref 0.5–2.0)

## 2014-09-01 LAB — PROTIME-INR
INR: 1.17 (ref 0.00–1.49)
Prothrombin Time: 15 seconds (ref 11.6–15.2)

## 2014-09-01 LAB — APTT: aPTT: 26 seconds (ref 24–37)

## 2014-09-01 LAB — CORTISOL: CORTISOL PLASMA: 48 ug/dL

## 2014-09-01 MED ORDER — ACETAMINOPHEN 325 MG PO TABS: 650.0000 mg | ORAL_TABLET | Freq: Four times a day (QID) | ORAL | Status: DC | PRN

## 2014-09-01 MED ORDER — HYDROCODONE-ACETAMINOPHEN 10-325 MG PO TABS: 1.0000 | ORAL_TABLET | Freq: Four times a day (QID) | ORAL | Status: DC | PRN

## 2014-09-01 MED ORDER — LORAZEPAM 2 MG/ML IJ SOLN
0.5000 mg | INTRAMUSCULAR | Status: DC | PRN
  Administered 2014-09-01 – 2014-09-03 (×6): 1 mg via INTRAVENOUS
  Administered 2014-09-03: 0.5 mg via INTRAVENOUS
  Administered 2014-09-04 – 2014-09-05 (×4): 1 mg via INTRAVENOUS
  Filled 2014-09-01 (×12): qty 1

## 2014-09-01 MED ORDER — PIPERACILLIN-TAZOBACTAM 3.375 G IVPB
3.3750 g | Freq: Three times a day (TID) | INTRAVENOUS | Status: DC
  Administered 2014-09-01 – 2014-09-02 (×4): 3.375 g via INTRAVENOUS
  Filled 2014-09-01 (×6): qty 50

## 2014-09-01 MED ORDER — ACETAMINOPHEN 650 MG RE SUPP: 650.0000 mg | RECTAL | Status: DC | PRN

## 2014-09-01 MED ORDER — METRONIDAZOLE IN NACL 5-0.79 MG/ML-% IV SOLN
500.0000 mg | Freq: Three times a day (TID) | INTRAVENOUS | Status: DC
  Administered 2014-09-01: 500 mg via INTRAVENOUS
  Filled 2014-09-01 (×3): qty 100

## 2014-09-01 MED ORDER — HEPARIN SODIUM (PORCINE) 5000 UNIT/ML IJ SOLN: 5000.0000 [IU] | Freq: Three times a day (TID) | INTRAMUSCULAR | Status: DC

## 2014-09-01 MED ORDER — HYDRALAZINE HCL 20 MG/ML IJ SOLN: 10.0000 mg | INTRAMUSCULAR | Status: DC | PRN

## 2014-09-01 MED ORDER — SODIUM CHLORIDE 0.45 % IV SOLN
INTRAVENOUS | Status: DC
  Administered 2014-09-01: 05:00:00 via INTRAVENOUS

## 2014-09-01 MED ORDER — FAMOTIDINE IN NACL 20-0.9 MG/50ML-% IV SOLN
20.0000 mg | Freq: Two times a day (BID) | INTRAVENOUS | Status: DC
  Administered 2014-09-01 – 2014-09-02 (×4): 20 mg via INTRAVENOUS
  Filled 2014-09-01 (×5): qty 50

## 2014-09-01 MED ORDER — METOCLOPRAMIDE HCL 5 MG/ML IJ SOLN
10.0000 mg | Freq: Once | INTRAMUSCULAR | Status: AC
  Administered 2014-09-01: 10 mg via INTRAVENOUS
  Filled 2014-09-01: qty 2

## 2014-09-01 MED ORDER — FENTANYL CITRATE (PF) 100 MCG/2ML IJ SOLN
50.0000 ug | Freq: Once | INTRAMUSCULAR | Status: AC
  Administered 2014-09-01: 50 ug via INTRAVENOUS

## 2014-09-01 MED ORDER — METOPROLOL TARTRATE 1 MG/ML IV SOLN: 2.5000 mg | INTRAVENOUS | Status: DC | PRN

## 2014-09-01 MED ORDER — ENOXAPARIN SODIUM 40 MG/0.4ML ~~LOC~~ SOLN
40.0000 mg | SUBCUTANEOUS | Status: DC
  Administered 2014-09-01 – 2014-09-04 (×4): 40 mg via SUBCUTANEOUS
  Filled 2014-09-01 (×5): qty 0.4

## 2014-09-01 MED ORDER — SODIUM CHLORIDE 0.9 % IV SOLN
INTRAVENOUS | Status: DC
Start: 2014-09-01 — End: 2014-09-01
  Administered 2014-09-01: 03:00:00 via INTRAVENOUS

## 2014-09-01 MED ORDER — HYDRALAZINE HCL 20 MG/ML IJ SOLN
10.0000 mg | INTRAMUSCULAR | Status: DC | PRN
  Filled 2014-09-01 (×2): qty 1

## 2014-09-01 MED ORDER — FENTANYL CITRATE (PF) 100 MCG/2ML IJ SOLN
50.0000 ug | Freq: Once | INTRAMUSCULAR | Status: DC
  Filled 2014-09-01: qty 2

## 2014-09-01 MED ORDER — IOHEXOL 300 MG/ML  SOLN
100.0000 mL | Freq: Once | INTRAMUSCULAR | Status: AC | PRN
  Administered 2014-09-01: 100 mL via INTRAVENOUS

## 2014-09-01 MED ORDER — SODIUM CHLORIDE 0.9 % IV BOLUS (SEPSIS)
1000.0000 mL | Freq: Once | INTRAVENOUS | Status: AC
  Administered 2014-09-01: 1000 mL via INTRAVENOUS

## 2014-09-01 MED ORDER — PIPERACILLIN-TAZOBACTAM 3.375 G IVPB 30 MIN
3.3750 g | Freq: Once | INTRAVENOUS | Status: AC
  Administered 2014-09-01: 3.375 g via INTRAVENOUS
  Filled 2014-09-01: qty 50

## 2014-09-01 MED ORDER — VANCOMYCIN HCL IN DEXTROSE 1-5 GM/200ML-% IV SOLN
1000.0000 mg | Freq: Once | INTRAVENOUS | Status: AC
  Administered 2014-09-01: 1000 mg via INTRAVENOUS
  Filled 2014-09-01: qty 200

## 2014-09-01 MED ORDER — ONDANSETRON HCL 4 MG/2ML IJ SOLN
4.0000 mg | Freq: Four times a day (QID) | INTRAMUSCULAR | Status: DC | PRN
  Administered 2014-09-01 – 2014-09-04 (×10): 4 mg via INTRAVENOUS
  Filled 2014-09-01 (×10): qty 2

## 2014-09-01 MED ORDER — ONDANSETRON HCL 4 MG/2ML IJ SOLN
4.0000 mg | Freq: Once | INTRAMUSCULAR | Status: AC
  Administered 2014-09-01: 4 mg via INTRAVENOUS
  Filled 2014-09-01: qty 2

## 2014-09-01 MED ORDER — TIZANIDINE HCL 4 MG PO TABS
4.0000 mg | ORAL_TABLET | Freq: Four times a day (QID) | ORAL | Status: DC | PRN
  Filled 2014-09-01: qty 1

## 2014-09-01 MED ORDER — HYDROMORPHONE HCL 1 MG/ML IJ SOLN
0.5000 mg | INTRAMUSCULAR | Status: DC | PRN
  Administered 2014-09-01 – 2014-09-02 (×10): 1 mg via INTRAVENOUS
  Administered 2014-09-03: 0.5 mg via INTRAVENOUS
  Administered 2014-09-03 (×2): 1 mg via INTRAVENOUS
  Administered 2014-09-03: 0.5 mg via INTRAVENOUS
  Administered 2014-09-04: 1 mg via INTRAVENOUS
  Administered 2014-09-04: 0.5 mg via INTRAVENOUS
  Administered 2014-09-04 – 2014-09-05 (×4): 1 mg via INTRAVENOUS
  Filled 2014-09-01 (×20): qty 1

## 2014-09-01 MED ORDER — KCL-LACTATED RINGERS-D5W 20 MEQ/L IV SOLN
INTRAVENOUS | Status: DC
  Administered 2014-09-01 (×2): via INTRAVENOUS
  Administered 2014-09-02: 1000 mL via INTRAVENOUS
  Administered 2014-09-03 (×2): via INTRAVENOUS
  Filled 2014-09-01 (×7): qty 1000

## 2014-09-01 MED ORDER — ACETAMINOPHEN 500 MG PO TABS
1000.0000 mg | ORAL_TABLET | Freq: Once | ORAL | Status: AC
  Administered 2014-09-01: 1000 mg via ORAL
  Filled 2014-09-01: qty 2

## 2014-09-01 MED ORDER — ONDANSETRON HCL 4 MG PO TABS
4.0000 mg | ORAL_TABLET | Freq: Four times a day (QID) | ORAL | Status: DC | PRN
  Filled 2014-09-01: qty 1

## 2014-09-01 MED ORDER — CETYLPYRIDINIUM CHLORIDE 0.05 % MT LIQD
7.0000 mL | Freq: Two times a day (BID) | OROMUCOSAL | Status: DC
  Administered 2014-09-01 – 2014-09-04 (×6): 7 mL via OROMUCOSAL

## 2014-09-01 MED ORDER — KETOROLAC TROMETHAMINE 15 MG/ML IJ SOLN
15.0000 mg | Freq: Four times a day (QID) | INTRAMUSCULAR | Status: DC | PRN
  Administered 2014-09-01: 15 mg via INTRAVENOUS
  Filled 2014-09-01: qty 1

## 2014-09-01 MED ORDER — METOPROLOL TARTRATE 1 MG/ML IV SOLN
2.5000 mg | INTRAVENOUS | Status: DC | PRN
  Administered 2014-09-01 – 2014-09-02 (×4): 5 mg via INTRAVENOUS
  Filled 2014-09-01 (×5): qty 5

## 2014-09-01 MED ORDER — DIAZEPAM 5 MG PO TABS: 5.0000 mg | ORAL_TABLET | Freq: Four times a day (QID) | ORAL | Status: DC | PRN

## 2014-09-01 MED ORDER — SODIUM CHLORIDE 0.9 % IV SOLN: 250.0000 mL | INTRAVENOUS | Status: DC | PRN

## 2014-09-01 MED ORDER — SODIUM CHLORIDE 0.9 % IJ SOLN
3.0000 mL | Freq: Two times a day (BID) | INTRAMUSCULAR | Status: DC
  Administered 2014-09-01 – 2014-09-04 (×7): 3 mL via INTRAVENOUS

## 2014-09-01 MED ORDER — VANCOMYCIN HCL IN DEXTROSE 750-5 MG/150ML-% IV SOLN
750.0000 mg | Freq: Three times a day (TID) | INTRAVENOUS | Status: DC
  Filled 2014-09-01: qty 150

## 2014-09-01 NOTE — ED Notes (Signed)
Pt dry heaving  

## 2014-09-01 NOTE — Consult Note (Signed)
[facility] Surgery Consult Note  [name] [birth year]  [id number].    Requesting MD: Dr. Alva Garnet Chief Complaint/Reason for Consult: Abdominal pain, N/V/D  HPI:  44 y/o white female with PMH of PTSD, DDD, bulging lumbar disc, and depression/anxiety, brought to [facility] by EMS from home with reports of sudden onset N/V/D with associated upper/mid abdominal pain that started between 12pm-2pm on 08/31/14. Pt reports 10-15 episodes of vomiting and diarrhea with watery diarrhea. No blood or mucous in stool. Pt also report blood streaking in emesis. Reports hot and cold chills, and says she felt feverish.  Any movement exacerbates the pain.  No alleviating factors.  No radiating pain.  Pain currently 5/10.  No sick contacts or travel.  She says she at a [employer] on Mothers day.  No previous hx of abdominal issues other than endometriosis.  Denies CP, SOB, urinary problems, rash, URI symptoms, dizziness, or weakness.  Admits to headache and neck tenderness.  Pt does have a PCP who she has seen within the past 1 year for a physical.  She gets free health care at [place]. Pt was recently treated for a UTI about 3 weeks ago, and was given Bactrim. The patient reports no h/o of abdominal surgeries, however, medical records indicate pt has had hernia repair and ablation of endometriosis.  Denies smoking, etoh, or drug use.    On hospital workup the patient was found to have leukocytosis 25 (now 17.8), fever 102.1*F (now normal), and lactic acidosis  9.3.  She was given 4L IVF and lactic acid improved to 7.  This morning it is 2.1.  Workup has been negative thus far for etiology of her symptoms, leukocytosis and elevated lactic acid.     ROS: All systems reviewed and otherwise negative except for as above  History reviewed. No pertinent family history.  Past Medical History  Diagnosis Date  . PTSD (post-traumatic stress disorder)   . DDD (degenerative disc disease), cervical   .  Bulging lumbar disc   . Depression   . Hypertension     Past Surgical History  Procedure Laterality Date  . Hernia repair    . Ablation on endometriosis      Social History:  reports that she has never smoked. She does not have any smokeless tobacco history on file. She reports that she does not drink alcohol. Her drug history is not on file.  Allergies: No Known Allergies  Medications Prior to Admission  Medication Sig Dispense Refill  . diazepam (VALIUM) 5 MG tablet Take 5 mg by mouth 3 (three) times daily.    Marland Kitchen HYDROcodone-acetaminophen (NORCO) 10-325 MG per tablet Take 1 tablet by mouth every 6 (six) hours as needed for moderate pain.    Marland Kitchen tiZANidine (ZANAFLEX) 4 MG tablet Take 4 mg by mouth every 6 (six) hours as needed for muscle spasms.      Blood pressure 159/112, pulse 131, temperature 98.7 F (37.1 C), temperature source Oral, resp. rate 21, height 5' 4"  (1.626 m), weight 72.576 kg (160 lb), last menstrual period 08/01/2014, SpO2 95 %. Physical Exam: General: pleasant, but nauseous looking, WD/WN white female who is laying in bed HEENT: head is normocephalic, atraumatic.  Sclera are noninjected.  PERRL.  Ears and nose without any masses or lesions.  Mouth is pink but dry. Heart: tachycardic, normal rhythm.  No obvious murmurs, gallops, or rubs noted.  Palpable pedal pulses bilaterally Lungs: CTAB, no wheezes, rhonchi, or rales noted.  Respiratory effort nonlabored  Abd: soft, ND, tender in the upper and mid abdomen, +BS, no masses, hernias, or organomegaly, no rebound or guarding, neg murphy's sign MS: all 4 extremities are symmetrical with no cyanosis, clubbing, or edema. Skin: warm and dry with no masses, lesions, or rashes Psych: A&Ox3 with an appropriate affect.   Results for orders placed or performed during the hospital encounter of 08/31/14 (from the past 48 hour(s))  CBC WITH DIFFERENTIAL     Status: Abnormal   Collection Time: 08/31/14 10:07 PM  Result Value  Ref Range   WBC 25.3 (H) 4.0 - 10.5 K/uL   RBC 4.48 3.87 - 5.11 MIL/uL   Hemoglobin 14.5 12.0 - 15.0 g/dL   HCT 42.3 36.0 - 46.0 %   MCV 94.4 78.0 - 100.0 fL   MCH 32.4 26.0 - 34.0 pg   MCHC 34.3 30.0 - 36.0 g/dL   RDW 12.5 11.5 - 15.5 %   Platelets 350 150 - 400 K/uL   Neutrophils Relative % 84 (H) 43 - 77 %   Neutro Abs 21.2 (H) 1.7 - 7.7 K/uL   Lymphocytes Relative 8 (L) 12 - 46 %   Lymphs Abs 2.1 0.7 - 4.0 K/uL   Monocytes Relative 8 3 - 12 %   Monocytes Absolute 2.0 (H) 0.1 - 1.0 K/uL   Eosinophils Relative 0 0 - 5 %   Eosinophils Absolute 0.0 0.0 - 0.7 K/uL   Basophils Relative 0 0 - 1 %   Basophils Absolute 0.0 0.0 - 0.1 K/uL  Comprehensive metabolic panel     Status: Abnormal   Collection Time: 08/31/14 10:07 PM  Result Value Ref Range   Sodium 143 135 - 145 mmol/L   Potassium 3.4 (L) 3.5 - 5.1 mmol/L   Chloride 108 101 - 111 mmol/L   CO2 12 (L) 22 - 32 mmol/L   Glucose, Bld 163 (H) 70 - 99 mg/dL   BUN 6 6 - 20 mg/dL   Creatinine, Ser 1.14 (H) 0.44 - 1.00 mg/dL   Calcium 9.2 8.9 - 10.3 mg/dL   Total Protein 7.8 6.5 - 8.1 g/dL   Albumin 4.1 3.5 - 5.0 g/dL   AST 49 (H) 15 - 41 U/L   ALT <5 (L) 14 - 54 U/L   Alkaline Phosphatase 88 38 - 126 U/L   Total Bilirubin 0.5 0.3 - 1.2 mg/dL   GFR calc non Af Amer 58 (L) >60 mL/min   GFR calc Af Amer >60 >60 mL/min    Comment: (NOTE) The eGFR has been calculated using the CKD EPI equation. This calculation has not been validated in all clinical situations. eGFR's persistently <60 mL/min signify possible Chronic Kidney Disease.    Anion gap 23 (H) 5 - 15  I-Stat CG4 Lactic Acid, ED  (not at [facility])     Status: Abnormal   Collection Time: 08/31/14 10:20 PM  Result Value Ref Range   Lactic Acid, Venous 9.99 (HH) 0.5 - 2.0 mmol/L   Comment NOTIFIED PHYSICIAN   I-Stat beta hCG blood, ED     Status: None   Collection Time: 08/31/14 11:02 PM  Result Value Ref Range   I-stat hCG, quantitative <5.0 <5 mIU/mL   Comment 3             Comment:   GEST. AGE      CONC.  (mIU/mL)   <=1 WEEK        5 - 50     2 WEEKS  50 - 500     3 WEEKS       100 - 10,000     4 WEEKS     1,000 - 30,000        FEMALE AND NON-PREGNANT FEMALE:     LESS THAN 5 mIU/mL   I-stat Chem 8, ED     Status: Abnormal   Collection Time: 08/31/14 11:04 PM  Result Value Ref Range   Sodium 146 (H) 135 - 145 mmol/L   Potassium 3.8 3.5 - 5.1 mmol/L   Chloride 111 101 - 111 mmol/L   BUN 4 (L) 6 - 20 mg/dL   Creatinine, Ser 0.80 0.44 - 1.00 mg/dL   Glucose, Bld 148 (H) 70 - 99 mg/dL   Calcium, Ion 1.07 (L) 1.12 - 1.23 mmol/L   TCO2 12 0 - 100 mmol/L   Hemoglobin 16.3 (H) 12.0 - 15.0 g/dL   HCT 48.0 (H) 36.0 - 46.0 %  Urinalysis with microscopic     Status: Abnormal   Collection Time: 08/31/14 11:05 PM  Result Value Ref Range   Color, Urine YELLOW YELLOW   APPearance CLOUDY (A) CLEAR   Specific Gravity, Urine 1.017 1.005 - 1.030   pH 5.0 5.0 - 8.0   Glucose, UA NEGATIVE NEGATIVE mg/dL   Hgb urine dipstick TRACE (A) NEGATIVE   Bilirubin Urine NEGATIVE NEGATIVE   Ketones, ur 40 (A) NEGATIVE mg/dL   Protein, ur 30 (A) NEGATIVE mg/dL   Urobilinogen, UA 0.2 0.0 - 1.0 mg/dL   Nitrite NEGATIVE NEGATIVE   Leukocytes, UA NEGATIVE NEGATIVE  Pregnancy, urine     Status: None   Collection Time: 08/31/14 11:05 PM  Result Value Ref Range   Preg Test, Ur NEGATIVE NEGATIVE    Comment:        THE SENSITIVITY OF THIS METHODOLOGY IS >20 mIU/mL.   Urine microscopic-add on     Status: Abnormal   Collection Time: 08/31/14 11:05 PM  Result Value Ref Range   Squamous Epithelial / LPF FEW (A) RARE   WBC, UA 0-2 <3 WBC/hpf   RBC / HPF 0-2 <3 RBC/hpf   Bacteria, UA FEW (A) RARE   Casts HYALINE CASTS (A) NEGATIVE   Urine-Other AMORPHOUS URATES/PHOSPHATES     Comment: MUCOUS PRESENT  Urine rapid drug screen (hosp performed)     Status: Abnormal   Collection Time: 08/31/14 11:05 PM  Result Value Ref Range   Opiates POSITIVE (A) NONE DETECTED   Cocaine  NONE DETECTED NONE DETECTED   Benzodiazepines POSITIVE (A) NONE DETECTED   Amphetamines NONE DETECTED NONE DETECTED   Tetrahydrocannabinol NONE DETECTED NONE DETECTED   Barbiturates NONE DETECTED NONE DETECTED    Comment:        DRUG SCREEN FOR MEDICAL PURPOSES ONLY.  IF CONFIRMATION IS NEEDED FOR ANY PURPOSE, NOTIFY LAB WITHIN 5 DAYS.        LOWEST DETECTABLE LIMITS FOR URINE DRUG SCREEN Drug Class       Cutoff (ng/mL) Amphetamine      1000 Barbiturate      200 Benzodiazepine   841 Tricyclics       324 Opiates          300 Cocaine          300 THC              50   I-Stat CG4 Lactic Acid, ED     Status: Abnormal   Collection Time: 09/01/14  1:26 AM  Result Value Ref  Range   Lactic Acid, Venous 7.24 (HH) 0.5 - 2.0 mmol/L   Comment NOTIFIED PHYSICIAN   Lactic acid, plasma     Status: Abnormal   Collection Time: 09/01/14  4:07 AM  Result Value Ref Range   Lactic Acid, Venous 5.7 (HH) 0.5 - 2.0 mmol/L    Comment: REPEATED TO VERIFY CRITICAL RESULT CALLED TO, READ BACK BY AND VERIFIED WITH: C.Edison Pace RN [id number] 09/01/14 E.GADDY   CBC with Differential     Status: Abnormal   Collection Time: 09/01/14  4:08 AM  Result Value Ref Range   WBC 17.8 (H) 4.0 - 10.5 K/uL   RBC 4.17 3.87 - 5.11 MIL/uL   Hemoglobin 13.3 12.0 - 15.0 g/dL   HCT 39.5 36.0 - 46.0 %   MCV 94.7 78.0 - 100.0 fL   MCH 31.9 26.0 - 34.0 pg   MCHC 33.7 30.0 - 36.0 g/dL   RDW 12.8 11.5 - 15.5 %   Platelets 221 150 - 400 K/uL   Neutrophils Relative % 90 (H) 43 - 77 %   Neutro Abs 16.0 (H) 1.7 - 7.7 K/uL   Lymphocytes Relative 4 (L) 12 - 46 %   Lymphs Abs 0.7 0.7 - 4.0 K/uL   Monocytes Relative 6 3 - 12 %   Monocytes Absolute 1.0 0.1 - 1.0 K/uL   Eosinophils Relative 0 0 - 5 %   Eosinophils Absolute 0.0 0.0 - 0.7 K/uL   Basophils Relative 0 0 - 1 %   Basophils Absolute 0.0 0.0 - 0.1 K/uL  Comprehensive metabolic panel     Status: Abnormal   Collection Time: 09/01/14  4:08 AM  Result Value Ref Range   Sodium  142 135 - 145 mmol/L   Potassium 4.0 3.5 - 5.1 mmol/L   Chloride 112 (H) 101 - 111 mmol/L   CO2 15 (L) 22 - 32 mmol/L   Glucose, Bld 133 (H) 70 - 99 mg/dL   BUN <5 (L) 6 - 20 mg/dL   Creatinine, Ser 0.91 0.44 - 1.00 mg/dL   Calcium 8.1 (L) 8.9 - 10.3 mg/dL   Total Protein 7.2 6.5 - 8.1 g/dL   Albumin 3.7 3.5 - 5.0 g/dL   AST 44 (H) 15 - 41 U/L   ALT 24 14 - 54 U/L   Alkaline Phosphatase 72 38 - 126 U/L   Total Bilirubin 0.4 0.3 - 1.2 mg/dL   GFR calc non Af Amer >60 >60 mL/min   GFR calc Af Amer >60 >60 mL/min    Comment: (NOTE) The eGFR has been calculated using the CKD EPI equation. This calculation has not been validated in all clinical situations. eGFR's persistently <60 mL/min signify possible Chronic Kidney Disease.    Anion gap 15 5 - 15  Procalcitonin     Status: None   Collection Time: 09/01/14  4:08 AM  Result Value Ref Range   Procalcitonin 0.51 ng/mL    Comment:        Interpretation: PCT > 0.5 ng/mL and <= 2 ng/mL: Systemic infection (sepsis) is possible, but other conditions are known to elevate PCT as well. (NOTE)         ICU PCT Algorithm               Non ICU PCT Algorithm    ----------------------------     ------------------------------         PCT < 0.25 ng/mL  PCT < 0.1 ng/mL     Stopping of antibiotics            Stopping of antibiotics       strongly encouraged.               strongly encouraged.    ----------------------------     ------------------------------       PCT level decrease by               PCT < 0.25 ng/mL       >= 80% from peak PCT       OR PCT 0.25 - 0.5 ng/mL          Stopping of antibiotics                                             encouraged.     Stopping of antibiotics           encouraged.    ----------------------------     ------------------------------       PCT level decrease by              PCT >= 0.25 ng/mL       < 80% from peak PCT        AND PCT >= 0.5 ng/mL             Continuing antibiotics                                               encouraged.       Continuing antibiotics            encouraged.    ----------------------------     ------------------------------     PCT level increase compared          PCT > 0.5 ng/mL         with peak PCT AND          PCT >= 0.5 ng/mL             Escalation of antibiotics                                          strongly encouraged.      Escalation of antibiotics        strongly encouraged.   Protime-INR     Status: None   Collection Time: 09/01/14  4:08 AM  Result Value Ref Range   Prothrombin Time 15.0 11.6 - 15.2 seconds   INR 1.17 0.00 - 1.49  APTT     Status: None   Collection Time: 09/01/14  4:08 AM  Result Value Ref Range   aPTT 26 24 - 37 seconds  Lipase, blood     Status: Abnormal   Collection Time: 09/01/14  4:08 AM  Result Value Ref Range   Lipase 15 (L) 22 - 51 U/L  Influenza panel by PCR (type A & B, H1N1)     Status: None   Collection Time: 09/01/14  4:35 AM  Result Value Ref Range   Influenza A By PCR NEGATIVE NEGATIVE   Influenza B By PCR NEGATIVE NEGATIVE   H1N1  flu by pcr NOT DETECTED NOT DETECTED    Comment:        The Xpert Flu assay (FDA approved for nasal aspirates or washes and nasopharyngeal swab specimens), is intended as an aid in the diagnosis of influenza and should not be used as a sole basis for treatment.   Glucose, capillary     Status: Abnormal   Collection Time: 09/01/14  6:59 AM  Result Value Ref Range   Glucose-Capillary 132 (H) 70 - 99 mg/dL   Comment 1 Notify RN   Cortisol     Status: None   Collection Time: 09/01/14  8:05 AM  Result Value Ref Range   Cortisol, Plasma 48.0 ug/dL    Comment: (NOTE) AM    6.7 - 22.6 ug/dL PM   <10.0       ug/dL   MRSA PCR Screening     Status: None   Collection Time: 09/01/14  8:17 AM  Result Value Ref Range   MRSA by PCR NEGATIVE NEGATIVE    Comment:        The GeneXpert MRSA Assay (FDA approved for NASAL specimens only), is one component of  a comprehensive MRSA colonization surveillance program. It is not intended to diagnose MRSA infection nor to guide or monitor treatment for MRSA infections.   Lactic acid, plasma     Status: Abnormal   Collection Time: 09/01/14 11:30 AM  Result Value Ref Range   Lactic Acid, Venous 2.1 (HH) 0.5 - 2.0 mmol/L    Comment: REPEATED TO VERIFY CRITICAL RESULT CALLED TO, READ BACK BY AND VERIFIED WITH: A.MINOR,RN 09/01/14 1230 BY BSLADE    Ct Abdomen Pelvis W Contrast  09/01/2014   CLINICAL DATA:  Abdominal pain nausea and vomiting since this morning.  EXAM: CT ABDOMEN AND PELVIS WITH CONTRAST  TECHNIQUE: Multidetector CT imaging of the abdomen and pelvis was performed using the standard protocol following bolus administration of intravenous contrast.  CONTRAST:  134m OMNIPAQUE IOHEXOL 300 MG/ML  SOLN  COMPARISON:  11/22/2013  FINDINGS: Lower chest: Minimal linear opacities in the right lung base likely are atelectatic or scarring.  Hepatobiliary: There are normal appearances of the liver, gallbladder and bile ducts.  Pancreas: Normal  Spleen: Normal  Adrenals/Urinary Tract: The adrenals and kidneys are normal in appearance. There is no urinary calculus evident. There is no hydronephrosis or ureteral dilatation. Collecting systems and ureters appear unremarkable.  Stomach/Bowel: There are normal appearances of the stomach, small bowel and colon. The appendix is normal.  Vascular/Lymphatic: The abdominal aorta is normal in caliber. There is no atherosclerotic calcification. There is no adenopathy in the abdomen or pelvis.  Reproductive: The uterus and ovaries appear normal  Other: No acute inflammatory changes are evident in the abdomen or pelvis. There is no ascites.  Musculoskeletal: There are no significant musculoskeletal abnormalities.  IMPRESSION: No significant abnormalities.   Electronically Signed   By: DAndreas NewportM.D.   On: 09/01/2014 00:29   Dg Chest Portable 1 View  09/01/2014    CLINICAL DATA:  Emesis, diarrhea, abdominal pain, and shortness of breath.  EXAM: PORTABLE CHEST - 1 VIEW  COMPARISON:  None.  FINDINGS: Shallow inspiration. The heart size and mediastinal contours are within normal limits. Both lungs are clear. The visualized skeletal structures are unremarkable.  IMPRESSION: No active disease.   Electronically Signed   By: WLucienne CapersM.D.   On: 09/01/2014 00:12      Assessment/Plan Abdominal pain N/V/D Leukocytosis Persistently elevated lactic acid -  Do not feel she has an acute abdomen, we are not convinced she has a surgical problem at this time.  May want to obtain an ultrasound or HIDA (if she can tolerate holding pain meds for 6 hours) to rule out gallbladder as the source since her pain is primarily in the upper abdomen.  CT is not very good for cholecystitis. -NPO, bowel rest, IV hydration, pain control, antiemetics, antibiotics (Zosyn) -SCD's and already on lovenox for DVT proph -Recommend ambulate and IS -Monitor, trend lactic acid levels -Consider ultrasound or HIDA   Coralie Keens, [facility] Surgery 09/01/2014, 1:29 PM Pager: [phone]

## 2014-09-01 NOTE — H&P (Signed)
[facility] History and Physical  Patient: [name]  MRN: [id number]  DOB: 1971/02/04  DOS: the patient was seen and examined on 09/01/2014 PCP: Leonard Downing, MD  Referring physician: Noland Fordyce, PA-C Chief Complaint: Abdominal pain nausea vomiting diarrhea since last 2 days  HPI: [name] is a 44 y.o. female with Past medical history of PTSD. The patient is presenting with complaints of nausea vomiting diarrhea with abdominal pain. She mentions everything started with diarrhea. She has 3-4 episodes of loose watery bowel movements without any blood or mucus. She also started having 10-15 episodes of vomiting with some blood streaking at the end of the vomiting last few episodes. She complains of fever as well as chills. She mentions she had a [place] food 2 days ago but denies eating any meat. She does not take any over-the-counter supplements. Denies any alcohol abuse. Denies any drug abuse. Denies any similar symptoms in the past. She does have prior history of endometriosis and had ablations but no major abdominal surgeries. She was recently treated with Bactrim for UTI in April. She denies any rash anywhere denies any travel anywhere denies any shortness of breath cough chest pain.  The patient is coming from home. And at her baseline independent for most of her ADL.  Review of Systems: as mentioned in the history of present illness.  A comprehensive review of the other systems is negative.  Past Medical History  Diagnosis Date  . PTSD (post-traumatic stress disorder)   . DDD (degenerative disc disease), cervical   . Bulging lumbar disc   . Depression   . Hypertension    Past Surgical History  Procedure Laterality Date  . Hernia repair    . Ablation on endometriosis     Social History:  reports that she has never smoked. She does not have any smokeless tobacco history on file. She reports that she does not drink alcohol. Her drug history is  not on file.  No Known Allergies  History reviewed. No pertinent family history.  Prior to Admission medications   Medication Sig Start Date End Date Taking? Authorizing Provider  diazepam (VALIUM) 5 MG tablet Take 5 mg by mouth 3 (three) times daily.   Yes Historical Provider, MD  HYDROcodone-acetaminophen (NORCO) 10-325 MG per tablet Take 1 tablet by mouth every 6 (six) hours as needed for moderate pain.   Yes Historical Provider, MD  tiZANidine (ZANAFLEX) 4 MG tablet Take 4 mg by mouth every 6 (six) hours as needed for muscle spasms.   Yes Historical Provider, MD    Physical Exam: Filed Vitals:   09/01/14 0230 09/01/14 0245 09/01/14 0254 09/01/14 0312  BP: 191/116 182/113  145/90  Pulse: 133 133  146  Temp:   102.1 F (38.9 C)   TempSrc:   Rectal   Resp:    22  Height:      Weight:      SpO2: 100% 99%  93%    General: Alert, Awake and Oriented to Time, Place and Person. Appear in mild distress Eyes: PERRL ENT: Oral Mucosa clear dry. Neck: no JVD Cardiovascular: S1 and S2 Present, no Murmur, Peripheral Pulses Present Respiratory: Bilateral Air entry equal and Decreased,  Clear to Auscultation, no Crackles, no wheezes Abdomen: Bowel Sound present, Soft and periumbilical as well as epigastric tenderness Skin: no Rash Extremities: no Pedal edema, no calf tenderness Neurologic: Grossly no focal neuro deficit.  Labs on Admission:  CBC:  Recent Labs Lab 08/31/14 2207 08/31/14  2304  WBC 25.3*  --   NEUTROABS 21.2*  --   HGB 14.5 16.3*  HCT 42.3 48.0*  MCV 94.4  --   PLT 350  --     CMP     Component Value Date/Time   NA 146* 08/31/2014 2304   K 3.8 08/31/2014 2304   CL 111 08/31/2014 2304   CO2 12* 08/31/2014 2207   GLUCOSE 148* 08/31/2014 2304   BUN 4* 08/31/2014 2304   CREATININE 0.80 08/31/2014 2304   CALCIUM 9.2 08/31/2014 2207   PROT 7.8 08/31/2014 2207   ALBUMIN 4.1 08/31/2014 2207   AST 49* 08/31/2014 2207   ALT <5* 08/31/2014 2207   ALKPHOS 88  08/31/2014 2207   BILITOT 0.5 08/31/2014 2207   GFRNONAA 58* 08/31/2014 2207   GFRAA >60 08/31/2014 2207    No results for input(s): LIPASE, AMYLASE in the last 168 hours.  No results for input(s): CKTOTAL, CKMB, CKMBINDEX, TROPONINI in the last 168 hours. BNP (last 3 results) No results for input(s): BNP in the last 8760 hours.  ProBNP (last 3 results) No results for input(s): PROBNP in the last 8760 hours.   Radiological Exams on Admission: Ct Abdomen Pelvis W Contrast  09/01/2014   CLINICAL DATA:  Abdominal pain nausea and vomiting since this morning.  EXAM: CT ABDOMEN AND PELVIS WITH CONTRAST  TECHNIQUE: Multidetector CT imaging of the abdomen and pelvis was performed using the standard protocol following bolus administration of intravenous contrast.  CONTRAST:  151mL OMNIPAQUE IOHEXOL 300 MG/ML  SOLN  COMPARISON:  11/22/2013  FINDINGS: Lower chest: Minimal linear opacities in the right lung base likely are atelectatic or scarring.  Hepatobiliary: There are normal appearances of the liver, gallbladder and bile ducts.  Pancreas: Normal  Spleen: Normal  Adrenals/Urinary Tract: The adrenals and kidneys are normal in appearance. There is no urinary calculus evident. There is no hydronephrosis or ureteral dilatation. Collecting systems and ureters appear unremarkable.  Stomach/Bowel: There are normal appearances of the stomach, small bowel and colon. The appendix is normal.  Vascular/Lymphatic: The abdominal aorta is normal in caliber. There is no atherosclerotic calcification. There is no adenopathy in the abdomen or pelvis.  Reproductive: The uterus and ovaries appear normal  Other: No acute inflammatory changes are evident in the abdomen or pelvis. There is no ascites.  Musculoskeletal: There are no significant musculoskeletal abnormalities.  IMPRESSION: No significant abnormalities.   Electronically Signed   By: Andreas Newport M.D.   On: 09/01/2014 00:29   Dg Chest Portable 1  View  09/01/2014   CLINICAL DATA:  Emesis, diarrhea, abdominal pain, and shortness of breath.  EXAM: PORTABLE CHEST - 1 VIEW  COMPARISON:  None.  FINDINGS: Shallow inspiration. The heart size and mediastinal contours are within normal limits. Both lungs are clear. The visualized skeletal structures are unremarkable.  IMPRESSION: No active disease.   Electronically Signed   By: Lucienne Capers M.D.   On: 09/01/2014 00:12   EKG: Independently reviewed. sinus tachycardia.  Assessment/Plan Principal Problem:   Sepsis Active Problems:   Abdominal pain, generalized   PTSD (post-traumatic stress disorder)   Endometriosis   Nausea vomiting and diarrhea   Sinus tachycardia   Lactic acidosis   1. Sepsis The patient is presenting with complaints of abdominal pain associated with nausea vomiting and diarrhea. He does not have any vomiting here after receiving nausea medications but she continues to have fever with chills and abdominal pain. A CAT scan of the abdomen with contrast  was not showing any acute abnormality. Her UA appears clear at this point. But she does have significant leukocytosis with significant lactic acidosis. She also has abdominal tenderness. With this I would broaden the coverage of antibiotics for the bank and Zosyn with Flagyl. Check her for C. difficile and GI pathogen panel. Possibility of viral gastroenteritis cannot be ruled out. Recheck lactic acid levels as well as repeat the labs.  2. PTSD. Continue Valium.  3. Sinus tachycardia. EKG does not show any other acute abnormality most likely related to pain associated with accelerated hypertension. We will aggressively control the pain with Dilaudid. Does not appear any withdrawal or overdose situation.  4. Accelerated hypertension. Most likely secondary to pain. We will control the pain. If her pain does not improve she may need further consultation.  5. Lactic acidosis. Probably secondary to dehydration  with sepsis. Continue closely monitoring her in stepdown unit. Aggressive IV hydration. Monitor ins and outs.  Advance goals of care discussion: Full code   DVT Prophylaxis: subcutaneous Heparin Nutrition: Nothing by mouth except medications  Disposition: Admitted as inpatient, step-down unit.  Author: Berle Mull, MD [facility] Pager: [phone] 09/01/2014   Addendum: The patient persistently remains tachycardic with hypertension and abdominal pain. Clinically appears worsening with fever. Discussed with critical care Dr. Lake Bells. They will graciously assumed the care of the patient.  [name] 3:45 AM 09/01/2014    If 7PM-7AM, please contact night-coverage [email or web address] Password [email or web address]

## 2014-09-01 NOTE — Progress Notes (Signed)
ANTIBIOTIC CONSULT NOTE - INITIAL  Pharmacy Consult for Vancomycin  Indication: rule out sepsis  No Known Allergies  Patient Measurements: Height: 5\' 4"  (162.6 cm) Weight: 160 lb (72.576 kg) IBW/kg (Calculated) : 54.7  Vital Signs: Temp: 99.5 F (37.5 C) (05/10 2305) Temp Source: Rectal (05/10 2305) BP: 171/123 mmHg (05/10 2345) Pulse Rate: 115 (05/10 2345)  Labs:  Recent Labs  08/31/14 2207 08/31/14 2304  WBC 25.3*  --   HGB 14.5 16.3*  PLT 350  --   CREATININE 1.14* 0.80   Estimated Creatinine Clearance: 88.6 mL/min (by C-G formula based on Cr of 0.8).  Medical History: Past Medical History  Diagnosis Date  . PTSD (post-traumatic stress disorder)   . DDD (degenerative disc disease), cervical   . Bulging lumbar disc   . Depression   . Hypertension     Assessment: 44 y/o F here with vomiting, diarrhea, abdominal pain. WBC found to be elevated. Recent treatment for UTI. Renal function good. Unclear source at this time, ?abdomen, ?UTI.   Goal of Therapy:  Vancomycin trough level 15-20 mcg/ml  Plan:  -Vancomycin 1000 mg IV x 1, then 750 mg IV q8h -Zosyn ordered x 1, f/u additional gram negative coverage  -Trend WBC, temp, renal function  -Drug levels as indicated   Narda Bonds 09/01/2014,12:17 AM

## 2014-09-01 NOTE — Progress Notes (Signed)
44yo female started on IV ABX for sepsis w/ concern for intraabdominal infection to broaden coverage.  Will begin Zosyn 3.375g IV Q8H for CrCl ~90 ml/min and monitor CBC and Cx.  Wynona Neat, PharmD, BCPS 09/01/2014 2:43 AM

## 2014-09-01 NOTE — H&P (Signed)
PULMONARY / CRITICAL CARE MEDICINE   Name: [name] MRN: [id number] DOB: June 09, 1970    ADMISSION DATE:  08/31/2014 CONSULTATION DATE:  09/01/2014  REFERRING MD :  [facility] Dr. Posey Pronto  CHIEF COMPLAINT:  Abd pain  INITIAL PRESENTATION: 43 year old female presented to [facility] ED 5/11 with complaint of abdominal pain, N/V/D with onset earlier that day. In ED lactic was 9. Cleared to 7 after 4 L crystalloid resuscitation. CT abd negative. PCCM to see for admission.  STUDIES:  5/11 CT Abd > No significant abnormalities  SIGNIFICANT EVENTS: 4/17 seen in [facility] ED and treated with bactrim for UTI 5/10 admitted for acute onset abd pain, n/v/d.   HISTORY OF PRESENT ILLNESS:  44 year old female with PMH as below, which is significant for endometriosis s/p ablation, UTI on recent ABX, HTN, and PTSD. She was recently treated for UTI with bactrim and had completed the scheduled course. She was in her usual state of health until 5/10 when she developed acute onset abdominal pain associated with nausea/vomiting/diarrhea. She presented to ED for these complaints and was found to have leukocytosis (25), fevers (102.51F), and lactic acid elevation (9.3). She was provided with 4 liters IVF resuscitation and lactic improved only to 7. CT abdomen was negative. PCCM was called for admission and further eval.   She denies recent travel or sedentary behavior, sick contacts. No hx IV drug use. She does endorse headache and neck ache. She is on scheduled norco for DDD and takes them as scheduled. Did not stop taking until after abdominal symptoms had started. She has a history of depression and PTSD but only takes scheduled valium, no SSRIs. Every movement exacerbates her abdominal pain which is her main complaint.   PAST MEDICAL HISTORY :   has a past medical history of PTSD (post-traumatic stress disorder); DDD (degenerative disc disease), cervical; Bulging lumbar disc; Depression; and Hypertension.  has past surgical  history that includes Hernia repair and Ablation on endometriosis. Prior to Admission medications   Medication Sig Start Date End Date Taking? Authorizing Provider  diazepam (VALIUM) 5 MG tablet Take 5 mg by mouth 3 (three) times daily.   Yes Historical Provider, MD  HYDROcodone-acetaminophen (NORCO) 10-325 MG per tablet Take 1 tablet by mouth every 6 (six) hours as needed for moderate pain.   Yes Historical Provider, MD  tiZANidine (ZANAFLEX) 4 MG tablet Take 4 mg by mouth every 6 (six) hours as needed for muscle spasms.   Yes Historical Provider, MD   No Known Allergies  FAMILY HISTORY:  has no family status information on file.  SOCIAL HISTORY:  reports that she has never smoked. She does not have any smokeless tobacco history on file. She reports that she does not drink alcohol.  REVIEW OF SYSTEMS:    Bolds are positive  Constitutional: weight loss, gain, night sweats, Fevers, chills, fatigue .  HEENT: headaches, Sore throat, sneezing, nasal congestion, post nasal drip, Difficulty swallowing, Tooth/dental problems, visual complaints visual changes, ear ache CV:  chest pain, radiates: ,Orthopnea, PND, swelling in lower extremities, dizziness, palpitations, syncope.  GI  heartburn, indigestion, abdominal pain, nausea, vomiting, diarrhea, change in bowel habits, loss of appetite, bloody stools.  Resp: cough, productive: , hemoptysis, dyspnea, chest pain, pleuritic.  Skin: rash or itching or icterus GU: dysuria, change in color of urine, urgency or frequency. flank pain, hematuria  MS: joint pain or swelling. decreased range of motion  Psych: change in mood or affect. depression or anxiety.  Neuro:  difficulty with speech, weakness, numbness, ataxia    SUBJECTIVE:   VITAL SIGNS: Temp:  [99.5 F (37.5 C)-102.1 F (38.9 C)] 102.1 F (38.9 C) (05/11 0254) Pulse Rate:  [105-146] 146 (05/11 0312) Resp:  [10-28] 22 (05/11 0312) BP: (145-197)/(90-134) 145/90 mmHg (05/11 0312) SpO2:  [93  %-100 %] 93 % (05/11 0312) Weight:  [72.576 kg (160 lb)] 72.576 kg (160 lb) (05/10 2133) HEMODYNAMICS:   VENTILATOR SETTINGS:   INTAKE / OUTPUT:  Intake/Output Summary (Last 24 hours) at 09/01/14 0342 Last data filed at 09/01/14 0210  Gross per 24 hour  Intake   4250 ml  Output    580 ml  Net   3670 ml    PHYSICAL EXAMINATION: General:  Overweight female, appears older than stated age, in NAD Neuro:  Alert, oriented, non-focal, flat affect, some delirium. Repeating answers to questions.  HEENT:  [place]/AT, no JVD, PERRL Cardiovascular:  Tachy, regular Lungs:  Clear bilateral breath sounds Abdomen:  Soft, generalized tenderness, hypoactive BS. Musculoskeletal:  No acute deformity or ROM limitation.  Skin:  Erythema to face  LABS:  CBC  Recent Labs Lab 08/31/14 2207 08/31/14 2304  WBC 25.3*  --   HGB 14.5 16.3*  HCT 42.3 48.0*  PLT 350  --    Coag's No results for input(s): APTT, INR in the last 168 hours. BMET  Recent Labs Lab 08/31/14 2207 08/31/14 2304  NA 143 146*  K 3.4* 3.8  CL 108 111  CO2 12*  --   BUN 6 4*  CREATININE 1.14* 0.80  GLUCOSE 163* 148*   Electrolytes  Recent Labs Lab 08/31/14 2207  CALCIUM 9.2   Sepsis Markers  Recent Labs Lab 08/31/14 2220 09/01/14 0126  LATICACIDVEN 9.99* 7.24*   ABG No results for input(s): PHART, PCO2ART, PO2ART in the last 168 hours. Liver Enzymes  Recent Labs Lab 08/31/14 2207  AST 49*  ALT <5*  ALKPHOS 88  BILITOT 0.5  ALBUMIN 4.1   Cardiac Enzymes No results for input(s): TROPONINI, PROBNP in the last 168 hours. Glucose No results for input(s): GLUCAP in the last 168 hours.  Imaging Dg Chest Portable 1 View  09/01/2014   CLINICAL DATA:  Emesis, diarrhea, abdominal pain, and shortness of breath.  EXAM: PORTABLE CHEST - 1 VIEW  COMPARISON:  None.  FINDINGS: Shallow inspiration. The heart size and mediastinal contours are within normal limits. Both lungs are clear. The visualized skeletal  structures are unremarkable.  IMPRESSION: No active disease.   Electronically Signed   By: Lucienne Capers M.D.   On: 09/01/2014 00:12     ASSESSMENT / PLAN:  PULMONARY A: No acute issues  P:   Supplemental O2 PRN Pulmonary hygeine  CARDIOVASCULAR A:  Tachycardia, sinus Hypertensive urgency  P:  Telemetry monitoring Trend lactic, troponin Consider CVL/CVP PRN metoprolol PRN hydralazine Echo  RENAL A:   AKI - improved Hypernatremia High AG met acidosis - lactic  P:   Follow Bmet Give volume with 1/2 NS @ 125/HR  GASTROINTESTINAL A:   Generalized abdominal pain N/V/D  P:   NPO SUP: IV pepcid  HEMATOLOGIC A:   No acute issues  P:  Follow CBC  INFECTIOUS A:   Severe Sepsis - etiology unclear. Broad ddx includes Intraabdominal infection/endocarditis/meningitis. ? Viral gastroenteritis Fever  P:   BCx2 5/10 > UC 5/10 > Stool cx 5/10 > Stool viral panel 5/1 > Abx: Zosyn, start date 5/11 >>> Abx: Flagyl, start date 5/11 >>> Abx: Vanc, start date >5/11<  holding with concern for red mans syndrome  Consider LP Echo Cooling blanket  ENDOCRINE A:   No acute issues ? Pheochromocytoma  P:   Check TSH Cortisol Urine metanephrines and fractionated catecholamines  NEUROLOGIC A:   Acute encephalopathy PTSD/Depression Pain management  P:   RASS goal: 0 Holding home Norco/valium for now  PRN dilaudid   FAMILY  - Updates:   - Inter-disciplinary family meet or Palliative Care meeting due by: 5/18   Georgann Housekeeper, AGACNP-BC [place] [facility]/Critical Care Pager [phone] or [phone]  09/01/2014 4:49 AM   PCCM ATTENDING: I have reviewed pt's initial presentation, consultants notes and hospital database in detail.  The above assessment and plan was formulated under my direction.  In summary: Abrupt onset of abd pain with N/V.  Persistent elevation of lactic acid Hypertension without prior dx of such Ct abd  unremarkable Abd exam reveals mild to mod R sided tenderness without rebound Exact dx is unclear I have asked [facility] to see    Merton Border, MD;  PCCM service; Mobile [phone]

## 2014-09-01 NOTE — Progress Notes (Signed)
Utilization review completed.  

## 2014-09-01 NOTE — Progress Notes (Signed)
Persistent tachycardia 120-140. Reset heart rate alarm high to 145. MD aware of tachcardia.

## 2014-09-01 NOTE — ED Notes (Signed)
Admitting MD at bedside.

## 2014-09-01 NOTE — ED Notes (Signed)
Attempted to call report

## 2014-09-02 DIAGNOSIS — I10 Essential (primary) hypertension: Secondary | ICD-10-CM

## 2014-09-02 LAB — LACTIC ACID, PLASMA
Lactic Acid, Venous: 2.5 mmol/L (ref 0.5–2.0)
Lactic Acid, Venous: 1.8 mmol/L (ref 0.5–2.0)

## 2014-09-02 LAB — BASIC METABOLIC PANEL
ANION GAP: 12 (ref 5–15)
BUN: <5 mg/dL — ABNORMAL LOW (ref 6–20)
CALCIUM: 8.6 mg/dL — AB (ref 8.9–10.3)
CO2: 23 mmol/L (ref 22–32)
Chloride: 106 mmol/L (ref 101–111)
Creatinine, Ser: 0.74 mg/dL (ref 0.44–1.00)
GFR calc Af Amer: >60 mL/min (ref >60)
GLUCOSE: 140 mg/dL — AB (ref 65–99)
Potassium: 4.3 mmol/L (ref 3.5–5.1)
SODIUM: 141 mmol/L (ref 135–145)

## 2014-09-02 LAB — HEPATIC FUNCTION PANEL
ALK PHOS: 63 U/L (ref 38–126)
ALT: 28 U/L (ref 14–54)
AST: 59 U/L — ABNORMAL HIGH (ref 15–41)
Albumin: 3.4 g/dL — ABNORMAL LOW (ref 3.5–5.0)
BILIRUBIN INDIRECT: 0.7 mg/dL (ref 0.3–0.9)
Bilirubin, Direct: 0.4 mg/dL (ref 0.1–0.5)
Total Bilirubin: 1.1 mg/dL (ref 0.3–1.2)
Total Protein: 6.4 g/dL — ABNORMAL LOW (ref 6.5–8.1)

## 2014-09-02 LAB — AMYLASE: AMYLASE: 28 U/L (ref 28–100)

## 2014-09-02 LAB — URINE CULTURE
Colony Count: NO GROWTH
Culture: NO GROWTH

## 2014-09-02 LAB — CBC
HCT: 38.4 % (ref 36.0–46.0)
Hemoglobin: 12.7 g/dL (ref 12.0–15.0)
MCH: 31.5 pg (ref 26.0–34.0)
MCHC: 33.1 g/dL (ref 30.0–36.0)
MCV: 95.3 fL (ref 78.0–100.0)
Platelets: 235 10*3/uL (ref 150–400)
RBC: 4.03 MIL/uL (ref 3.87–5.11)
RDW: 13.3 % (ref 11.5–15.5)
WBC: 15.1 10*3/uL — ABNORMAL HIGH (ref 4.0–10.5)

## 2014-09-02 MED ORDER — HYDRALAZINE HCL 20 MG/ML IJ SOLN
10.0000 mg | INTRAMUSCULAR | Status: DC | PRN
  Administered 2014-09-02 – 2014-09-04 (×3): 20 mg via INTRAVENOUS
  Filled 2014-09-02 (×3): qty 1

## 2014-09-02 NOTE — Progress Notes (Signed)
CRITICAL VALUE ALERT  Critical value received: Lactic acid 2.5  Date of notification:  09/02/14  Time of notification:  [id number]  Critical value read back: yes  Nurse who received alert: Deboraha Sprang, RN  MD notified (1st page): [facility] 09/02/14 0351   :

## 2014-09-02 NOTE — Progress Notes (Signed)
[facility] Progress Note     Subjective: Pt says pain and N/V are improved.  No further vomiting or diarrhea since yesterday.  She's on precaution secondary to diarrhea upon admission, but has not had a BM yet.  Not been OOB.    Objective: Vital signs in last 24 hours: Temp:  [98 F (36.7 C)-99 F (37.2 C)] 98 F (36.7 C) (05/12 0751) Pulse Rate:  [98-151] 109 (05/12 0800) Resp:  [10-25] 15 (05/12 0800) BP: (131-171)/(96-128) 131/96 mmHg (05/12 0800) SpO2:  [89 %-97 %] 89 % (05/12 0800) Weight:  [74.9 kg (165 lb 2 oz)] 74.9 kg (165 lb 2 oz) (05/12 0500) Last BM Date: 08/31/14  Intake/Output from previous day: 05/11 0701 - 05/12 0700 In: 1805 [I.V.:1605; IV Piggyback:200] Out: 1600 [Urine:1550; Emesis/NG output:50] Intake/Output this shift: Total I/O In: 75 [I.V.:75] Out: -   PE: Gen:  Alert, NAD, pleasant Card:  Mildly tachy, reg rhythm, no M/G/R heard Pulm:  CTA, no W/R/R, good effort Abd: Soft, mildly distended, tender in RUQ and LUQ (over ribs), Neg murphys sign, +BS, no HSM   Lab Results:   Recent Labs  09/01/14 0408 09/02/14 0540  WBC 17.8* 15.1*  HGB 13.3 12.7  HCT 39.5 38.4  PLT 221 235   BMET  Recent Labs  09/01/14 0408 09/02/14 0219  NA 142 141  K 4.0 4.3  CL 112* 106  CO2 15* 23  GLUCOSE 133* 140*  BUN <5* <5*  CREATININE 0.91 0.74  CALCIUM 8.1* 8.6*   PT/INR  Recent Labs  09/01/14 0408  LABPROT 15.0  INR 1.17   CMP     Component Value Date/Time   NA 141 09/02/2014 0219   K 4.3 09/02/2014 0219   CL 106 09/02/2014 0219   CO2 23 09/02/2014 0219   GLUCOSE 140* 09/02/2014 0219   BUN <5* 09/02/2014 0219   CREATININE 0.74 09/02/2014 0219   CALCIUM 8.6* 09/02/2014 0219   PROT 6.4* 09/02/2014 0219   ALBUMIN 3.4* 09/02/2014 0219   AST 59* 09/02/2014 0219   ALT 28 09/02/2014 0219   ALKPHOS 63 09/02/2014 0219   BILITOT 1.1 09/02/2014 0219   GFRNONAA >60 09/02/2014 0219   GFRAA >60 09/02/2014 0219   Lipase      Component Value Date/Time   LIPASE 15* 09/01/2014 0408       Studies/Results: Ct Abdomen Pelvis W Contrast  09/01/2014   CLINICAL DATA:  Abdominal pain nausea and vomiting since this morning.  EXAM: CT ABDOMEN AND PELVIS WITH CONTRAST  TECHNIQUE: Multidetector CT imaging of the abdomen and pelvis was performed using the standard protocol following bolus administration of intravenous contrast.  CONTRAST:  161mL OMNIPAQUE IOHEXOL 300 MG/ML  SOLN  COMPARISON:  11/22/2013  FINDINGS: Lower chest: Minimal linear opacities in the right lung base likely are atelectatic or scarring.  Hepatobiliary: There are normal appearances of the liver, gallbladder and bile ducts.  Pancreas: Normal  Spleen: Normal  Adrenals/Urinary Tract: The adrenals and kidneys are normal in appearance. There is no urinary calculus evident. There is no hydronephrosis or ureteral dilatation. Collecting systems and ureters appear unremarkable.  Stomach/Bowel: There are normal appearances of the stomach, small bowel and colon. The appendix is normal.  Vascular/Lymphatic: The abdominal aorta is normal in caliber. There is no atherosclerotic calcification. There is no adenopathy in the abdomen or pelvis.  Reproductive: The uterus and ovaries appear normal  Other: No acute inflammatory changes are evident in the abdomen or pelvis. There is no  ascites.  Musculoskeletal: There are no significant musculoskeletal abnormalities.  IMPRESSION: No significant abnormalities.   Electronically Signed   By: Andreas Newport M.D.   On: 09/01/2014 00:29   Dg Chest Portable 1 View  09/01/2014   CLINICAL DATA:  Emesis, diarrhea, abdominal pain, and shortness of breath.  EXAM: PORTABLE CHEST - 1 VIEW  COMPARISON:  None.  FINDINGS: Shallow inspiration. The heart size and mediastinal contours are within normal limits. Both lungs are clear. The visualized skeletal structures are unremarkable.  IMPRESSION: No active disease.   Electronically Signed   By: Lucienne Capers M.D.   On: 09/01/2014 00:12    Anti-infectives: Anti-infectives    Start     Dose/Rate Route Frequency Ordered Stop   09/01/14 0800  vancomycin (VANCOCIN) IVPB 750 mg/150 ml premix  Status:  Discontinued     750 mg 150 mL/hr over 60 Minutes Intravenous Every 8 hours 09/01/14 0017 09/01/14 0454   09/01/14 0600  piperacillin-tazobactam (ZOSYN) IVPB 3.375 g     3.375 g 12.5 mL/hr over 240 Minutes Intravenous Every 8 hours 09/01/14 0242     09/01/14 0245  metroNIDAZOLE (FLAGYL) IVPB 500 mg  Status:  Discontinued     500 mg 100 mL/hr over 60 Minutes Intravenous Every 8 hours 09/01/14 0239 09/01/14 0834   09/01/14 0030  vancomycin (VANCOCIN) IVPB 1000 mg/200 mL premix     1,000 mg 200 mL/hr over 60 Minutes Intravenous  Once 09/01/14 0017 09/01/14 0148   09/01/14 0015  piperacillin-tazobactam (ZOSYN) IVPB 3.375 g     3.375 g 100 mL/hr over 30 Minutes Intravenous  Once 09/01/14 0005 09/01/14 0103       Assessment/Plan Abdominal pain N/V/D Leukocytosis Persistently elevated lactic acid -Do not feel she has an acute abdomen, we are not convinced she has a surgical problem at this time. Considering HIDA, but pt is requiring frequent pain medications.  Last dose around 7am.  She needs to hold pain meds and be NPO for 6 hours prior to study.  CT is not very good for cholecystitis. LFT's are not very concerning, lipase was 15. -NPO, bowel rest, IV hydration, pain control, antiemetics, antibiotics (Zosyn) -SCD's and already on lovenox for DVT proph -Recommend ambulate and IS -Monitor, trend lactic acid levels, repeat at 10am pending since 2am it went up to 2.5 -If she can not tolerate holding pain meds for 6 hours, option for ultrasound or MRCP?    LOS: 1 day    DORT,  Jon 09/02/2014, 8:22 AM Pager: [phone]

## 2014-09-02 NOTE — Progress Notes (Signed)
No new complaints Abd pain is present but improved Problems with hypertension overnight and this AM - responds to hypdralazine Lactic acidosis has resolved  Filed Vitals:   09/02/14 1230 09/02/14 1300 09/02/14 1330 09/02/14 1400  BP: 117/83 132/96 141/110 151/105  Pulse: 123 123 131 124  Temp:      TempSrc:      Resp: 17 17 17 15   Height:      Weight:      SpO2: 97% 99% 98% 98%   Appears mildly uncomfortable HEENT WNL No JVD Chest clear Tachy, regular, no M Abd soft, minimally tender diffusely Ext warm without edema Neuro intact  I have reviewed all of today's lab results. Relevant abnormalities are discussed in the A/P section  IMPRESSION/PLAN: Severe abdominal pain of unclear etiology  CTAP enitrely normal  CCS consult 5/11 appreciated  HIDA scan recommended - ordered 5/12 Severe lactic acidosis without hypotension - etiology unclear  Resolved 5/12 AM Hypertension (diastolic > systolic) - not on chronic medical therapy  Cont PRN hydralazine   Goal SBP < 170 mmHg, MAP < 105 mmHg Sinus tachycardia - reactive and reflexive  PRN metoprolol to maintain HR < 115/min  Transfer to telemetry. TRH to resume care as of AM 5/13 and PCCM to sing off. Discussed with Dr Marton Redwood, MD ; [facility] [phone].  After 5:30 PM or weekends, call [phone]

## 2014-09-02 NOTE — Progress Notes (Signed)
[facility] Progress Note Patient Name: [name] DOB: [birth year] MRN: [id number]   Date of Service  09/02/2014  HPI/Events of Note   Recent Labs Lab 09/01/14 0407 09/01/14 0408 09/01/14 1130 09/02/14 0219  LATICACIDVEN 5.7*  --  2.1* 2.5*  PROCALCITON  --  0.51  --   --      eICU Interventions  Lactate improving but ? But worsening again v lab variation  Plan Repeat lactate at 10am     Intervention Category Intermediate Interventions: Other:  Kellin Fifer 09/02/2014, 5:58 AM

## 2014-09-03 ENCOUNTER — Inpatient Hospital Stay (HOSPITAL_COMMUNITY): Payer: Medicaid Other

## 2014-09-03 LAB — BASIC METABOLIC PANEL
Anion gap: 12 (ref 5–15)
BUN: <5 mg/dL — ABNORMAL LOW (ref 6–20)
CALCIUM: 8.4 mg/dL — AB (ref 8.9–10.3)
CO2: 26 mmol/L (ref 22–32)
CREATININE: 0.84 mg/dL (ref 0.44–1.00)
Chloride: 102 mmol/L (ref 101–111)
GFR calc Af Amer: >60 mL/min (ref >60)
GFR calc non Af Amer: >60 mL/min (ref >60)
Glucose, Bld: 128 mg/dL — ABNORMAL HIGH (ref 65–99)
POTASSIUM: 3.3 mmol/L — AB (ref 3.5–5.1)
Sodium: 140 mmol/L (ref 135–145)

## 2014-09-03 LAB — CBC
HEMATOCRIT: 37.9 % (ref 36.0–46.0)
Hemoglobin: 12.4 g/dL (ref 12.0–15.0)
MCH: 31.6 pg (ref 26.0–34.0)
MCHC: 32.7 g/dL (ref 30.0–36.0)
MCV: 96.7 fL (ref 78.0–100.0)
Platelets: 211 10*3/uL (ref 150–400)
RBC: 3.92 MIL/uL (ref 3.87–5.11)
RDW: 13.5 % (ref 11.5–15.5)
WBC: 12.2 10*3/uL — AB (ref 4.0–10.5)

## 2014-09-03 LAB — LIPASE, BLOOD: LIPASE: 30 U/L (ref 22–51)

## 2014-09-03 LAB — HEPATIC FUNCTION PANEL
ALBUMIN: 3.1 g/dL — AB (ref 3.5–5.0)
ALT: 21 U/L (ref 14–54)
AST: 26 U/L (ref 15–41)
Alkaline Phosphatase: 55 U/L (ref 38–126)
BILIRUBIN INDIRECT: 0.4 mg/dL (ref 0.3–0.9)
BILIRUBIN TOTAL: 0.5 mg/dL (ref 0.3–1.2)
Bilirubin, Direct: 0.1 mg/dL (ref 0.1–0.5)
TOTAL PROTEIN: 6.7 g/dL (ref 6.5–8.1)

## 2014-09-03 MED ORDER — TECHNETIUM TC 99M MEBROFENIN IV KIT
5.0000 | PACK | Freq: Once | INTRAVENOUS | Status: AC | PRN
  Administered 2014-09-03: 5 via INTRAVENOUS

## 2014-09-03 MED ORDER — METRONIDAZOLE IN NACL 5-0.79 MG/ML-% IV SOLN
500.0000 mg | Freq: Three times a day (TID) | INTRAVENOUS | Status: DC
  Administered 2014-09-04 – 2014-09-05 (×5): 500 mg via INTRAVENOUS
  Filled 2014-09-03 (×7): qty 100

## 2014-09-03 MED ORDER — CIPROFLOXACIN IN D5W 400 MG/200ML IV SOLN
400.0000 mg | Freq: Two times a day (BID) | INTRAVENOUS | Status: DC
  Administered 2014-09-03 – 2014-09-05 (×4): 400 mg via INTRAVENOUS
  Filled 2014-09-03 (×5): qty 200

## 2014-09-03 MED ORDER — POTASSIUM CHLORIDE 10 MEQ/100ML IV SOLN
10.0000 meq | INTRAVENOUS | Status: AC
  Administered 2014-09-03 (×2): 10 meq via INTRAVENOUS
  Filled 2014-09-03 (×2): qty 100

## 2014-09-03 NOTE — Progress Notes (Addendum)
[facility] PROGRESS NOTE  [name] [id number] DOB: 06/05/1970 DOA: 08/31/2014 PCP: Leonard Downing, MD  Assessment/Plan:  Principal Problem:   Sepsis Active Problems:   Abdominal pain, generalized   PTSD (post-traumatic stress disorder)   Endometriosis   Nausea vomiting and diarrhea   Sinus tachycardia   Lactic acidosis Hypokalemia: replete  HIDA negative. Still with loose stool and nausea, but improving. Pain improving as well.  Likely etiology is infectious gastroenteritis. Start clears, check GI pathogen panel. Change abx to cipro and flagyl. Was on oupt abx for uti a month ago.  HPI/Subjective: Pain improving. Nausea, no vomiting. Thirsty. Loose stool continues.  Ate at a [employer].  Objective: Filed Vitals:   09/03/14 1344  BP: 150/105  Pulse: 118  Temp: 98.3 F (36.8 C)  Resp: 16    Intake/Output Summary (Last 24 hours) at 09/03/14 1624 Last data filed at 09/03/14 1448  Gross per 24 hour  Intake      0 ml  Output    400 ml  Net   -400 ml   Filed Weights   08/31/14 2133 09/02/14 0500  Weight: 72.576 kg (160 lb) 74.9 kg (165 lb 2 oz)    Exam:   General:  A and o  HEENT: dry MM  Cardiovascular: fast, regular. No mgr   Respiratory: CTA without WRR  Abdomen: S, NT, ND  Ext: no CCE  Basic Metabolic Panel:  Recent Labs Lab 08/31/14 2207 08/31/14 2304 09/01/14 0408 09/02/14 0219 09/03/14 0410  NA 143 146* 142 141 140  K 3.4* 3.8 4.0 4.3 3.3*  CL 108 111 112* 106 102  CO2 12*  --  15* 23 26  GLUCOSE 163* 148* 133* 140* 128*  BUN 6 4* <5* <5* <5*  CREATININE 1.14* 0.80 0.91 0.74 0.84  CALCIUM 9.2  --  8.1* 8.6* 8.4*   Liver Function Tests:  Recent Labs Lab 08/31/14 2207 09/01/14 0408 09/02/14 0219 09/03/14 0410  AST 49* 44* 59* 26  ALT <5* 24 28 21   ALKPHOS 88 72 63 55  BILITOT 0.5 0.4 1.1 0.5  PROT 7.8 7.2 6.4* 6.7  ALBUMIN 4.1 3.7 3.4* 3.1*    Recent Labs Lab 09/01/14 0408  09/02/14 0219 09/03/14 0410  LIPASE 15*  --  30  AMYLASE  --  28  --    No results for input(s): AMMONIA in the last 168 hours. CBC:  Recent Labs Lab 08/31/14 2207 08/31/14 2304 09/01/14 0408 09/02/14 0540 09/03/14 0410  WBC 25.3*  --  17.8* 15.1* 12.2*  NEUTROABS 21.2*  --  16.0*  --   --   HGB 14.5 16.3* 13.3 12.7 12.4  HCT 42.3 48.0* 39.5 38.4 37.9  MCV 94.4  --  94.7 95.3 96.7  PLT 350  --  221 235 211   Cardiac Enzymes: No results for input(s): CKTOTAL, CKMB, CKMBINDEX, TROPONINI in the last 168 hours. BNP (last 3 results) No results for input(s): BNP in the last 8760 hours.  ProBNP (last 3 results) No results for input(s): PROBNP in the last 8760 hours.  CBG:  Recent Labs Lab 09/01/14 0659  GLUCAP 132*    Recent Results (from the past 240 hour(s))  Culture, blood (routine x 2)     Status: None (Preliminary result)   Collection Time: 08/31/14 10:07 PM  Result Value Ref Range Status   Specimen Description BLOOD LEFT ARM  Final   Special Requests BOTTLES DRAWN AEROBIC AND ANAEROBIC 5CC  Final  Culture   Final           BLOOD CULTURE RECEIVED NO GROWTH TO DATE CULTURE WILL BE HELD FOR 5 DAYS BEFORE ISSUING A FINAL NEGATIVE REPORT Performed at [facility]    Report Status PENDING  Incomplete  Culture, blood (routine x 2)     Status: None (Preliminary result)   Collection Time: 08/31/14 10:57 PM  Result Value Ref Range Status   Specimen Description BLOOD RIGHT HAND  Final   Special Requests BOTTLES DRAWN AEROBIC ONLY 5CC  Final   Culture   Final           BLOOD CULTURE RECEIVED NO GROWTH TO DATE CULTURE WILL BE HELD FOR 5 DAYS BEFORE ISSUING A FINAL NEGATIVE REPORT Performed at [facility]    Report Status PENDING  Incomplete  Urine culture     Status: None   Collection Time: 08/31/14 11:05 PM  Result Value Ref Range Status   Specimen Description URINE, CATHETERIZED  Final   Special Requests NONE  Final   Colony Count NO  GROWTH Performed at [facility]   Final   Culture NO GROWTH Performed at [facility]   Final   Report Status 09/02/2014 FINAL  Final  MRSA PCR Screening     Status: None   Collection Time: 09/01/14  8:17 AM  Result Value Ref Range Status   MRSA by PCR NEGATIVE NEGATIVE Final    Comment:        The GeneXpert MRSA Assay (FDA approved for NASAL specimens only), is one component of a comprehensive MRSA colonization surveillance program. It is not intended to diagnose MRSA infection nor to guide or monitor treatment for MRSA infections.      Studies: Nm Hepatobiliary Liver Func  09/03/2014   CLINICAL DATA:  Low abdominal pain and vomiting and diarrhea. Colon chills.  EXAM: NUCLEAR MEDICINE HEPATOBILIARY IMAGING  TECHNIQUE: Sequential images of the abdomen were obtained out to 60 minutes following intravenous administration of radiopharmaceutical.  RADIOPHARMACEUTICALS:  5.1 mCi Tc-56m Choletec IV  COMPARISON:  None.  FINDINGS: There is homogeneous radiotracer uptake within the liver. The gallbladder is evident by 20 min. Counts are present within the small bowel by 20 min.  IMPRESSION: Patent cystic duct and common bile duct. No evidence of cholecystitis.   Electronically Signed   By: [name] M.D.   On: 09/03/2014 14:28    Scheduled Meds: . antiseptic oral rinse  7 mL Mouth Rinse BID  . enoxaparin (LOVENOX) injection  40 mg Subcutaneous Q24H  . potassium chloride  10 mEq Intravenous Q1 Hr x 2  . sodium chloride  3 mL Intravenous Q12H   Continuous Infusions: . dextrose 5% lactated ringers with KCl 20 mEq/L 75 mL/hr at 09/03/14 [id number]    Time spent: 25 minutes  [facility] Pager [phone] [email or web address], password [facility] 09/03/2014, 4:24 PM  LOS: 2 days

## 2014-09-04 DIAGNOSIS — E876 Hypokalemia: Secondary | ICD-10-CM | POA: Diagnosis not present

## 2014-09-04 LAB — BASIC METABOLIC PANEL
Anion gap: 11 (ref 5–15)
BUN: <5 mg/dL — ABNORMAL LOW (ref 6–20)
CO2: 26 mmol/L (ref 22–32)
Calcium: 8.7 mg/dL — ABNORMAL LOW (ref 8.9–10.3)
Chloride: 102 mmol/L (ref 101–111)
Creatinine, Ser: 0.71 mg/dL (ref 0.44–1.00)
GFR calc Af Amer: >60 mL/min (ref >60)
Glucose, Bld: 133 mg/dL — ABNORMAL HIGH (ref 65–99)
POTASSIUM: 3.1 mmol/L — AB (ref 3.5–5.1)
Sodium: 139 mmol/L (ref 135–145)

## 2014-09-04 LAB — METANEPHRINES, URINE, 24 HOUR
METANEPH TOTAL UR: 147 ug/L
Metanephrines, 24H Ur: 59 ug/24 hr (ref 45–290)
Normetanephrine, 24H Ur: 386 ug/24 hr (ref 82–500)
Normetanephrine, Ur: 965 ug/L
Total Volume: 400

## 2014-09-04 LAB — MAGNESIUM: Magnesium: 2 mg/dL (ref 1.7–2.4)

## 2014-09-04 MED ORDER — POTASSIUM CHLORIDE 10 MEQ/100ML IV SOLN
10.0000 meq | INTRAVENOUS | Status: AC
  Administered 2014-09-04 (×4): 10 meq via INTRAVENOUS
  Filled 2014-09-04 (×4): qty 100

## 2014-09-04 NOTE — Progress Notes (Signed)
[facility] PROGRESS NOTE  [name] [id number] DOB: 13-Nov-1970 DOA: 08/31/2014 PCP: Leonard Downing, MD  Assessment/Plan:  Principal Problem:   Sepsis Active Problems:   Abdominal pain, generalized   PTSD (post-traumatic stress disorder)   Endometriosis   Nausea vomiting and diarrhea   Sinus tachycardia   Lactic acidosis Hypokalemia: replete IV. Mag ok  Slowly improving. GI pathogen panel pending. Advance diet. Increase activity. Continue empiric antibiotics for now.  HPI/Subjective: Reports loose stool, but only once today. Ate clears yesterday. Vomited this am, but no longer nauseated and wants to try solids. No abdominal pain.  Objective: Filed Vitals:   09/04/14 0644  BP: 126/83  Pulse:   Temp:   Resp:     Intake/Output Summary (Last 24 hours) at 09/04/14 1315 Last data filed at 09/04/14 0512  Gross per 24 hour  Intake   2533 ml  Output   1550 ml  Net    983 ml   Filed Weights   08/31/14 2133 09/02/14 0500  Weight: 72.576 kg (160 lb) 74.9 kg (165 lb 2 oz)    Exam:   General:  Asleep. Arousable.  HEENT: Mucous membranes less dry.  Cardiovascular: fast, regular. No mgr   Respiratory: CTA without WRR  Abdomen: S, NT, ND  Ext: no CCE  Basic Metabolic Panel:  Recent Labs Lab 08/31/14 2207 08/31/14 2304 09/01/14 0408 09/02/14 0219 09/03/14 0410 09/04/14 0512  NA 143 146* 142 141 140 139  K 3.4* 3.8 4.0 4.3 3.3* 3.1*  CL 108 111 112* 106 102 102  CO2 12*  --  15* 23 26 26   GLUCOSE 163* 148* 133* 140* 128* 133*  BUN 6 4* <5* <5* <5* <5*  CREATININE 1.14* 0.80 0.91 0.74 0.84 0.71  CALCIUM 9.2  --  8.1* 8.6* 8.4* 8.7*  MG  --   --   --   --   --  2.0   Liver Function Tests:  Recent Labs Lab 08/31/14 2207 09/01/14 0408 09/02/14 0219 09/03/14 0410  AST 49* 44* 59* 26  ALT <5* 24 28 21   ALKPHOS 88 72 63 55  BILITOT 0.5 0.4 1.1 0.5  PROT 7.8 7.2 6.4* 6.7  ALBUMIN 4.1 3.7 3.4* 3.1*    Recent Labs Lab  09/01/14 0408 09/02/14 0219 09/03/14 0410  LIPASE 15*  --  30  AMYLASE  --  28  --    No results for input(s): AMMONIA in the last 168 hours. CBC:  Recent Labs Lab 08/31/14 2207 08/31/14 2304 09/01/14 0408 09/02/14 0540 09/03/14 0410  WBC 25.3*  --  17.8* 15.1* 12.2*  NEUTROABS 21.2*  --  16.0*  --   --   HGB 14.5 16.3* 13.3 12.7 12.4  HCT 42.3 48.0* 39.5 38.4 37.9  MCV 94.4  --  94.7 95.3 96.7  PLT 350  --  221 235 211   Cardiac Enzymes: No results for input(s): CKTOTAL, CKMB, CKMBINDEX, TROPONINI in the last 168 hours. BNP (last 3 results) No results for input(s): BNP in the last 8760 hours.  ProBNP (last 3 results) No results for input(s): PROBNP in the last 8760 hours.  CBG:  Recent Labs Lab 09/01/14 0659  GLUCAP 132*    Recent Results (from the past 240 hour(s))  Culture, blood (routine x 2)     Status: None (Preliminary result)   Collection Time: 08/31/14 10:07 PM  Result Value Ref Range Status   Specimen Description BLOOD LEFT ARM  Final   Special Requests BOTTLES  DRAWN AEROBIC AND ANAEROBIC 5CC  Final   Culture   Final           BLOOD CULTURE RECEIVED NO GROWTH TO DATE CULTURE WILL BE HELD FOR 5 DAYS BEFORE ISSUING A FINAL NEGATIVE REPORT Performed at [facility]    Report Status PENDING  Incomplete  Culture, blood (routine x 2)     Status: None (Preliminary result)   Collection Time: 08/31/14 10:57 PM  Result Value Ref Range Status   Specimen Description BLOOD RIGHT HAND  Final   Special Requests BOTTLES DRAWN AEROBIC ONLY 5CC  Final   Culture   Final           BLOOD CULTURE RECEIVED NO GROWTH TO DATE CULTURE WILL BE HELD FOR 5 DAYS BEFORE ISSUING A FINAL NEGATIVE REPORT Performed at [facility]    Report Status PENDING  Incomplete  Urine culture     Status: None   Collection Time: 08/31/14 11:05 PM  Result Value Ref Range Status   Specimen Description URINE, CATHETERIZED  Final   Special Requests NONE  Final   Colony Count  NO GROWTH Performed at [facility]   Final   Culture NO GROWTH Performed at [facility]   Final   Report Status 09/02/2014 FINAL  Final  MRSA PCR Screening     Status: None   Collection Time: 09/01/14  8:17 AM  Result Value Ref Range Status   MRSA by PCR NEGATIVE NEGATIVE Final    Comment:        The GeneXpert MRSA Assay (FDA approved for NASAL specimens only), is one component of a comprehensive MRSA colonization surveillance program. It is not intended to diagnose MRSA infection nor to guide or monitor treatment for MRSA infections.      Studies: Nm Hepatobiliary Liver Func  09/03/2014   CLINICAL DATA:  Low abdominal pain and vomiting and diarrhea. Colon chills.  EXAM: NUCLEAR MEDICINE HEPATOBILIARY IMAGING  TECHNIQUE: Sequential images of the abdomen were obtained out to 60 minutes following intravenous administration of radiopharmaceutical.  RADIOPHARMACEUTICALS:  5.1 mCi Tc-44m Choletec IV  COMPARISON:  None.  FINDINGS: There is homogeneous radiotracer uptake within the liver. The gallbladder is evident by 20 min. Counts are present within the small bowel by 20 min.  IMPRESSION: Patent cystic duct and common bile duct. No evidence of cholecystitis.   Electronically Signed   By: [name] M.D.   On: 09/03/2014 14:28    Scheduled Meds: . antiseptic oral rinse  7 mL Mouth Rinse BID  . ciprofloxacin  400 mg Intravenous Q12H  . enoxaparin (LOVENOX) injection  40 mg Subcutaneous Q24H  . metronidazole  500 mg Intravenous Q8H  . sodium chloride  3 mL Intravenous Q12H   Continuous Infusions: . dextrose 5% lactated ringers with KCl 20 mEq/L 10 mL/hr at 09/03/14 2106    Time spent: 25 minutes  [place] [facility] Pager [phone] [email or web address], password [facility] 09/04/2014, 1:15 PM  LOS: 3 days

## 2014-09-05 LAB — CBC
HEMATOCRIT: 40.1 % (ref 36.0–46.0)
Hemoglobin: 12.9 g/dL (ref 12.0–15.0)
MCH: 31.6 pg (ref 26.0–34.0)
MCHC: 32.2 g/dL (ref 30.0–36.0)
MCV: 98.3 fL (ref 78.0–100.0)
Platelets: 176 10*3/uL (ref 150–400)
RBC: 4.08 MIL/uL (ref 3.87–5.11)
RDW: 13.5 % (ref 11.5–15.5)
WBC: 7 10*3/uL (ref 4.0–10.5)

## 2014-09-05 LAB — BASIC METABOLIC PANEL
ANION GAP: 11 (ref 5–15)
BUN: <5 mg/dL — ABNORMAL LOW (ref 6–20)
CHLORIDE: 103 mmol/L (ref 101–111)
CO2: 25 mmol/L (ref 22–32)
Calcium: 8.4 mg/dL — ABNORMAL LOW (ref 8.9–10.3)
Creatinine, Ser: 0.63 mg/dL (ref 0.44–1.00)
GFR calc non Af Amer: >60 mL/min (ref >60)
Glucose, Bld: 106 mg/dL — ABNORMAL HIGH (ref 65–99)
Potassium: 3.6 mmol/L (ref 3.5–5.1)
Sodium: 139 mmol/L (ref 135–145)

## 2014-09-05 MED ORDER — PROMETHAZINE HCL 12.5 MG PO TABS: 12.5000 mg | ORAL_TABLET | Freq: Four times a day (QID) | ORAL | Status: DC | PRN

## 2014-09-05 MED ORDER — LOPERAMIDE HCL 2 MG PO TABS: ORAL_TABLET | ORAL | Status: DC

## 2014-09-05 MED ORDER — ACETAMINOPHEN 650 MG RE SUPP: 650.0000 mg | RECTAL | Status: DC | PRN

## 2014-09-05 NOTE — Progress Notes (Signed)
Utilization Review completed. Shawndell Schillaci RN BSN CM 

## 2014-09-05 NOTE — Discharge Summary (Signed)
Physician Discharge Summary  [name] [id number] DOB: 1971-02-19 DOA: 08/31/2014  PCP: Leonard Downing, MD  Admit date: 08/31/2014 Discharge date: 09/05/2014  Time spent: greater than 30 minutes  Recommendations for Outpatient Follow-up:   Discharge Diagnoses:  Principal Problem:   Sepsis Active Problems:   Abdominal pain, generalized   PTSD (post-traumatic stress disorder)   Endometriosis   Nausea vomiting and diarrhea   Sinus tachycardia   Lactic acidosis   Severe sepsis   Hypokalemia   Discharge Condition: stable  Diet recommendation: general  Filed Weights   08/31/14 2133 09/02/14 0500  Weight: 72.576 kg (160 lb) 74.9 kg (165 lb 2 oz)    History of present illness:  44 year old female with PMH as below, which is significant for endometriosis s/p ablation, UTI on recent ABX, HTN, and PTSD. She was recently treated for UTI with bactrim and had completed the scheduled course. She was in her usual state of health until 5/10 when she developed acute onset abdominal pain associated with nausea/vomiting/diarrhea. She presented to ED for these complaints and was found to have leukocytosis (25), fevers (102.55F), and lactic acid elevation (9.3). She was provided with 4 liters IVF resuscitation and lactic improved only to 7. CT abdomen was negative. PCCM was called for admission and further eval.   She denies recent travel or sedentary behavior, sick contacts. No hx IV drug use. She does endorse headache and neck ache. She is on scheduled norco for DDD and takes them as scheduled. Did not stop taking until after abdominal symptoms had started. She has a history of depression and PTSD but only takes scheduled valium, no SSRIs. Every movement exacerbates her abdominal pain which is her main complaint.   Hospital Course:  Seen by hospitalist in ED, but required admission to ICU, so admitted to critical care service.  Fluid resussitated and started on empiric antibiotics.   Imaging showed no acute problems.  Surgery consulted and followed along.  No indication for surgery.  HIDA negative.  With antiemetics, able to advance diet.  Pain improved.  GI pathogen panel negative.  Diarrhea improving. Etiology not clear, but patient much improved with IVF, supportive care and empiric antibiotics  Procedures:  none  Consultations:  PCCM  surgery  Discharge Exam: Filed Vitals:   09/05/14 0452  BP: 148/96  Pulse:   Temp: 98.6 F (37 C)  Resp: 16    General: cofortable. A and o Cardiovascular: RRR Respiratory: CTA abd s, nt, nd  Discharge Instructions   Discharge Instructions    Diet general    Complete by:  As directed      Increase activity slowly    Complete by:  As directed           Current Discharge Medication List    START taking these medications   Details  acetaminophen (TYLENOL) 650 MG suppository Place 1 suppository (650 mg total) rectally every 4 (four) hours as needed for fever or mild pain. Qty: 12 suppository, Refills: 0    loperamide (IMODIUM A-D) 2 MG tablet Take as directed as needed for diarrhea Qty: 30 tablet, Refills: 0    promethazine (PHENERGAN) 12.5 MG tablet Take 1 tablet (12.5 mg total) by mouth every 6 (six) hours as needed for nausea or vomiting. Qty: 15 tablet, Refills: 0      CONTINUE these medications which have NOT CHANGED   Details  diazepam (VALIUM) 5 MG tablet Take 5 mg by mouth 3 (three) times daily.  HYDROcodone-acetaminophen (NORCO) 10-325 MG per tablet Take 1 tablet by mouth every 6 (six) hours as needed for moderate pain.    tiZANidine (ZANAFLEX) 4 MG tablet Take 4 mg by mouth every 6 (six) hours as needed for muscle spasms.       No Known Allergies Follow-up Information    Follow up with Leonard Downing, MD.   Specialty:  Family Medicine   Why:  If symptoms worsen   Contact information:   [place] [address] [phone]        The results of  significant diagnostics from this hospitalization (including imaging, microbiology, ancillary and laboratory) are listed below for reference.    Significant Diagnostic Studies: Nm Hepatobiliary Liver Func  09/03/2014   CLINICAL DATA:  Low abdominal pain and vomiting and diarrhea. Colon chills.  EXAM: NUCLEAR MEDICINE HEPATOBILIARY IMAGING  TECHNIQUE: Sequential images of the abdomen were obtained out to 60 minutes following intravenous administration of radiopharmaceutical.  RADIOPHARMACEUTICALS:  5.1 mCi Tc-60m Choletec IV  COMPARISON:  None.  FINDINGS: There is homogeneous radiotracer uptake within the liver. The gallbladder is evident by 20 min. Counts are present within the small bowel by 20 min.  IMPRESSION: Patent cystic duct and common bile duct. No evidence of cholecystitis.   Electronically Signed   By: [name] M.D.   On: 09/03/2014 14:28   Ct Abdomen Pelvis W Contrast  09/01/2014   CLINICAL DATA:  Abdominal pain nausea and vomiting since this morning.  EXAM: CT ABDOMEN AND PELVIS WITH CONTRAST  TECHNIQUE: Multidetector CT imaging of the abdomen and pelvis was performed using the standard protocol following bolus administration of intravenous contrast.  CONTRAST:  156mL OMNIPAQUE IOHEXOL 300 MG/ML  SOLN  COMPARISON:  11/22/2013  FINDINGS: Lower chest: Minimal linear opacities in the right lung base likely are atelectatic or scarring.  Hepatobiliary: There are normal appearances of the liver, gallbladder and bile ducts.  Pancreas: Normal  Spleen: Normal  Adrenals/Urinary Tract: The adrenals and kidneys are normal in appearance. There is no urinary calculus evident. There is no hydronephrosis or ureteral dilatation. Collecting systems and ureters appear unremarkable.  Stomach/Bowel: There are normal appearances of the stomach, small bowel and colon. The appendix is normal.  Vascular/Lymphatic: The abdominal aorta is normal in caliber. There is no atherosclerotic calcification. There is no  adenopathy in the abdomen or pelvis.  Reproductive: The uterus and ovaries appear normal  Other: No acute inflammatory changes are evident in the abdomen or pelvis. There is no ascites.  Musculoskeletal: There are no significant musculoskeletal abnormalities.  IMPRESSION: No significant abnormalities.   Electronically Signed   By: Andreas Newport M.D.   On: 09/01/2014 00:29   Dg Chest Portable 1 View  09/01/2014   CLINICAL DATA:  Emesis, diarrhea, abdominal pain, and shortness of breath.  EXAM: PORTABLE CHEST - 1 VIEW  COMPARISON:  None.  FINDINGS: Shallow inspiration. The heart size and mediastinal contours are within normal limits. Both lungs are clear. The visualized skeletal structures are unremarkable.  IMPRESSION: No active disease.   Electronically Signed   By: Lucienne Capers M.D.   On: 09/01/2014 00:12    Microbiology: Recent Results (from the past 240 hour(s))  Culture, blood (routine x 2)     Status: None (Preliminary result)   Collection Time: 08/31/14 10:07 PM  Result Value Ref Range Status   Specimen Description BLOOD LEFT ARM  Final   Special Requests BOTTLES DRAWN AEROBIC AND ANAEROBIC 5CC  Final  Culture   Final           BLOOD CULTURE RECEIVED NO GROWTH TO DATE CULTURE WILL BE HELD FOR 5 DAYS BEFORE ISSUING A FINAL NEGATIVE REPORT Performed at [facility]    Report Status PENDING  Incomplete  Culture, blood (routine x 2)     Status: None (Preliminary result)   Collection Time: 08/31/14 10:57 PM  Result Value Ref Range Status   Specimen Description BLOOD RIGHT HAND  Final   Special Requests BOTTLES DRAWN AEROBIC ONLY 5CC  Final   Culture   Final           BLOOD CULTURE RECEIVED NO GROWTH TO DATE CULTURE WILL BE HELD FOR 5 DAYS BEFORE ISSUING A FINAL NEGATIVE REPORT Performed at [facility]    Report Status PENDING  Incomplete  Urine culture     Status: None   Collection Time: 08/31/14 11:05 PM  Result Value Ref Range Status   Specimen Description  URINE, CATHETERIZED  Final   Special Requests NONE  Final   Colony Count NO GROWTH Performed at [facility]   Final   Culture NO GROWTH Performed at [facility]   Final   Report Status 09/02/2014 FINAL  Final  MRSA PCR Screening     Status: None   Collection Time: 09/01/14  8:17 AM  Result Value Ref Range Status   MRSA by PCR NEGATIVE NEGATIVE Final    Comment:        The GeneXpert MRSA Assay (FDA approved for NASAL specimens only), is one component of a comprehensive MRSA colonization surveillance program. It is not intended to diagnose MRSA infection nor to guide or monitor treatment for MRSA infections.      Labs: Basic Metabolic Panel:  Recent Labs Lab 09/01/14 0408 09/02/14 0219 09/03/14 0410 09/04/14 0512 09/05/14 0614  NA 142 141 140 139 139  K 4.0 4.3 3.3* 3.1* 3.6  CL 112* 106 102 102 103  CO2 15* 23 26 26 25   GLUCOSE 133* 140* 128* 133* 106*  BUN <5* <5* <5* <5* <5*  CREATININE 0.91 0.74 0.84 0.71 0.63  CALCIUM 8.1* 8.6* 8.4* 8.7* 8.4*  MG  --   --   --  2.0  --    Liver Function Tests:  Recent Labs Lab 08/31/14 2207 09/01/14 0408 09/02/14 0219 09/03/14 0410  AST 49* 44* 59* 26  ALT <5* 24 28 21   ALKPHOS 88 72 63 55  BILITOT 0.5 0.4 1.1 0.5  PROT 7.8 7.2 6.4* 6.7  ALBUMIN 4.1 3.7 3.4* 3.1*    Recent Labs Lab 09/01/14 0408 09/02/14 0219 09/03/14 0410  LIPASE 15*  --  30  AMYLASE  --  28  --    No results for input(s): AMMONIA in the last 168 hours. CBC:  Recent Labs Lab 08/31/14 2207 08/31/14 2304 09/01/14 0408 09/02/14 0540 09/03/14 0410 09/05/14 0614  WBC 25.3*  --  17.8* 15.1* 12.2* 7.0  NEUTROABS 21.2*  --  16.0*  --   --   --   HGB 14.5 16.3* 13.3 12.7 12.4 12.9  HCT 42.3 48.0* 39.5 38.4 37.9 40.1  MCV 94.4  --  94.7 95.3 96.7 98.3  PLT 350  --  221 235 211 176   Cardiac Enzymes: No results for input(s): CKTOTAL, CKMB, CKMBINDEX, TROPONINI in the last 168 hours. BNP: BNP (last 3 results) No  results for input(s): BNP in the last 8760 hours.  ProBNP (last 3 results)  No results for input(s): PROBNP in the last 8760 hours.  CBG:  Recent Labs Lab 09/01/14 0659  GLUCAP 132*       Signed:  La Dolores L  [facility] 09/05/2014, 9:57 AM

## 2014-09-05 NOTE — Progress Notes (Signed)
Patient discharge teaching given, including activity, diet, follow-up appoints, and medications. Patient verbalized understanding of all discharge instructions. IV access was d/c'd. Vitals are stable. Skin is intact except as charted in most recent assessments. Pt to be escorted out by NT, to be driven home by family. 

## 2014-09-05 NOTE — Discharge Instructions (Signed)
Abdominal Pain, Women °Abdominal (stomach, pelvic, or belly) pain can be caused by many things. It is important to tell your doctor: °· The location of the pain. °· Does it come and go or is it present all the time? °· Are there things that start the pain (eating certain foods, exercise)? °· Are there other symptoms associated with the pain (fever, nausea, vomiting, diarrhea)? °All of this is helpful to know when trying to find the cause of the pain. °CAUSES  °· Stomach: virus or bacteria infection, or ulcer. °· Intestine: appendicitis (inflamed appendix), regional ileitis (Crohn's disease), ulcerative colitis (inflamed colon), irritable bowel syndrome, diverticulitis (inflamed diverticulum of the colon), or cancer of the stomach or intestine. °· Gallbladder disease or stones in the gallbladder. °· Kidney disease, kidney stones, or infection. °· Pancreas infection or cancer. °· Fibromyalgia (pain disorder). °· Diseases of the female organs: °¨ Uterus: fibroid (non-cancerous) tumors or infection. °¨ Fallopian tubes: infection or tubal pregnancy. °¨ Ovary: cysts or tumors. °¨ Pelvic adhesions (scar tissue). °¨ Endometriosis (uterus lining tissue growing in the pelvis and on the pelvic organs). °¨ Pelvic congestion syndrome (female organs filling up with blood just before the menstrual period). °¨ Pain with the menstrual period. °¨ Pain with ovulation (producing an egg). °¨ Pain with an IUD (intrauterine device, birth control) in the uterus. °¨ Cancer of the female organs. °· Functional pain (pain not caused by a disease, may improve without treatment). °· Psychological pain. °· Depression. °DIAGNOSIS  °Your doctor will decide the seriousness of your pain by doing an examination. °· Blood tests. °· X-rays. °· Ultrasound. °· CT scan (computed tomography, special type of X-ray). °· MRI (magnetic resonance imaging). °· Cultures, for infection. °· Barium enema (dye inserted in the large intestine, to better view it with  X-rays). °· Colonoscopy (looking in intestine with a lighted tube). °· Laparoscopy (minor surgery, looking in abdomen with a lighted tube). °· Major abdominal exploratory surgery (looking in abdomen with a large incision). °TREATMENT  °The treatment will depend on the cause of the pain.  °· Many cases can be observed and treated at home. °· Over-the-counter medicines recommended by your caregiver. °· Prescription medicine. °· Antibiotics, for infection. °· Birth control pills, for painful periods or for ovulation pain. °· Hormone treatment, for endometriosis. °· Nerve blocking injections. °· Physical therapy. °· Antidepressants. °· Counseling with a psychologist or psychiatrist. °· Minor or major surgery. °HOME CARE INSTRUCTIONS  °· Do not take laxatives, unless directed by your caregiver. °· Take over-the-counter pain medicine only if ordered by your caregiver. Do not take aspirin because it can cause an upset stomach or bleeding. °· Try a clear liquid diet (broth or water) as ordered by your caregiver. Slowly move to a bland diet, as tolerated, if the pain is related to the stomach or intestine. °· Have a thermometer and take your temperature several times a day, and record it. °· Bed rest and sleep, if it helps the pain. °· Avoid sexual intercourse, if it causes pain. °· Avoid stressful situations. °· Keep your follow-up appointments and tests, as your caregiver orders. °· If the pain does not go away with medicine or surgery, you may try: °¨ Acupuncture. °¨ Relaxation exercises (yoga, meditation). °¨ Group therapy. °¨ Counseling. °SEEK MEDICAL CARE IF:  °· You notice certain foods cause stomach pain. °· Your home care treatment is not helping your pain. °· You need stronger pain medicine. °· You want your IUD removed. °· You feel faint or   lightheaded. °· You develop nausea and vomiting. °· You develop a rash. °· You are having side effects or an allergy to your medicine. °SEEK IMMEDIATE MEDICAL CARE IF:  °· Your  pain does not go away or gets worse. °· You have a fever. °· Your pain is felt only in portions of the abdomen. The right side could possibly be appendicitis. The left lower portion of the abdomen could be colitis or diverticulitis. °· You are passing blood in your stools (bright red or black tarry stools, with or without vomiting). °· You have blood in your urine. °· You develop chills, with or without a fever. °· You pass out. °MAKE SURE YOU:  °· Understand these instructions. °· Will watch your condition. °· Will get help right away if you are not doing well or get worse. °Document Released: 02/04/2007 Document Revised: 08/24/2013 Document Reviewed: 02/24/2009 °[employer]® Patient Information ©2015 [employer]. This information is not intended to replace advice given to you by your health care provider. Make sure you discuss any questions you have with your health care provider. ° °

## 2014-09-06 LAB — CATECHOLAMINES, FRACTIONATED, URINE, 24 HOUR
CATECHOLAMINES 24H UR: 200 ug/(24.h) — AB (ref 26–121)
Creatinine, Urine mg/day-CATEUR: 0.68 g/(24.h) (ref 0.63–2.50)
Dopamine 24 Hr Urine: 115 mcg/24 h (ref 52–480)
Epinephrine 24 Hr Urine: 23 mcg/24 h (ref 2–24)
Norepinephrine 24 Hr Urine: 177 mcg/24 h — ABNORMAL HIGH (ref 15–100)
TOTAL URINE VOLUME: 1800 mL

## 2014-09-07 LAB — GI PATHOGEN PANEL BY PCR, STOOL
C difficile toxin A/B: NOT DETECTED
Campylobacter by PCR: NOT DETECTED
Cryptosporidium by PCR: NOT DETECTED
E COLI (ETEC) LT/ST: NOT DETECTED
E COLI (STEC): NOT DETECTED
E coli 0157 by PCR: NOT DETECTED
G lamblia by PCR: NOT DETECTED
NOROVIRUS G1/G2: NOT DETECTED
ROTAVIRUS A BY PCR: NOT DETECTED
SALMONELLA BY PCR: NOT DETECTED
SHIGELLA BY PCR: NOT DETECTED

## 2014-09-07 LAB — CULTURE, BLOOD (ROUTINE X 2)
CULTURE: NO GROWTH
Culture: NO GROWTH

## 2014-10-15 ENCOUNTER — Emergency Department (HOSPITAL_COMMUNITY): Payer: Medicaid Other

## 2014-10-15 ENCOUNTER — Encounter (HOSPITAL_COMMUNITY): Payer: Self-pay | Admitting: Emergency Medicine

## 2014-10-15 ENCOUNTER — Inpatient Hospital Stay (HOSPITAL_COMMUNITY)
Admission: EM | Admit: 2014-10-15 | Discharge: 2014-10-17 | DRG: 392 | Disposition: A | Discharge status: Home / Self Care | Payer: Medicaid Other | Attending: Internal Medicine | Admitting: Internal Medicine

## 2014-10-15 DIAGNOSIS — R000 Tachycardia, unspecified: Secondary | ICD-10-CM

## 2014-10-15 DIAGNOSIS — F4310 Post-traumatic stress disorder, unspecified: Secondary | ICD-10-CM | POA: Diagnosis present

## 2014-10-15 DIAGNOSIS — R109 Unspecified abdominal pain: Secondary | ICD-10-CM | POA: Diagnosis present

## 2014-10-15 DIAGNOSIS — R112 Nausea with vomiting, unspecified: Principal | ICD-10-CM | POA: Diagnosis present

## 2014-10-15 DIAGNOSIS — I10 Essential (primary) hypertension: Secondary | ICD-10-CM | POA: Diagnosis present

## 2014-10-15 DIAGNOSIS — R1110 Vomiting, unspecified: Secondary | ICD-10-CM

## 2014-10-15 DIAGNOSIS — Z79899 Other long term (current) drug therapy: Secondary | ICD-10-CM | POA: Diagnosis not present

## 2014-10-15 DIAGNOSIS — Z79891 Long term (current) use of opiate analgesic: Secondary | ICD-10-CM

## 2014-10-15 DIAGNOSIS — I160 Hypertensive urgency: Secondary | ICD-10-CM

## 2014-10-15 DIAGNOSIS — R197 Diarrhea, unspecified: Secondary | ICD-10-CM | POA: Diagnosis present

## 2014-10-15 DIAGNOSIS — E860 Dehydration: Secondary | ICD-10-CM | POA: Diagnosis present

## 2014-10-15 HISTORY — DX: Other chronic pain: G89.29

## 2014-10-15 HISTORY — DX: Headache, unspecified: R51.9

## 2014-10-15 HISTORY — DX: Anxiety disorder, unspecified: F41.9

## 2014-10-15 HISTORY — DX: Hyperlipidemia, unspecified: E78.5

## 2014-10-15 HISTORY — DX: Unspecified malignant neoplasm of skin, unspecified: C44.90

## 2014-10-15 HISTORY — DX: Headache: R51

## 2014-10-15 HISTORY — DX: Unspecified osteoarthritis, unspecified site: M19.90

## 2014-10-15 HISTORY — DX: Low back pain, unspecified: M54.50

## 2014-10-15 HISTORY — DX: Low back pain: M54.5

## 2014-10-15 LAB — CBC WITH DIFFERENTIAL/PLATELET
BASOS ABS: 0 10*3/uL (ref 0.0–0.1)
BASOS PCT: 0 % (ref 0–1)
EOS PCT: 1 % (ref 0–5)
Eosinophils Absolute: 0.2 10*3/uL (ref 0.0–0.7)
HCT: 44.2 % (ref 36.0–46.0)
Hemoglobin: 14.5 g/dL (ref 12.0–15.0)
LYMPHS PCT: 13 % (ref 12–46)
Lymphs Abs: 1.8 10*3/uL (ref 0.7–4.0)
MCH: 31.1 pg (ref 26.0–34.0)
MCHC: 32.8 g/dL (ref 30.0–36.0)
MCV: 94.8 fL (ref 78.0–100.0)
Monocytes Absolute: 1.2 10*3/uL — ABNORMAL HIGH (ref 0.1–1.0)
Monocytes Relative: 9 % (ref 3–12)
Neutro Abs: 10.5 10*3/uL — ABNORMAL HIGH (ref 1.7–7.7)
Neutrophils Relative %: 77 % (ref 43–77)
PLATELETS: 322 10*3/uL (ref 150–400)
RBC: 4.66 MIL/uL (ref 3.87–5.11)
RDW: 12.2 % (ref 11.5–15.5)
WBC: 13.6 10*3/uL — AB (ref 4.0–10.5)

## 2014-10-15 LAB — URINALYSIS, ROUTINE W REFLEX MICROSCOPIC
BILIRUBIN URINE: NEGATIVE
Glucose, UA: NEGATIVE mg/dL
Hgb urine dipstick: NEGATIVE
KETONES UR: 15 mg/dL — AB
LEUKOCYTES UA: NEGATIVE
Nitrite: NEGATIVE
PROTEIN: NEGATIVE mg/dL
Specific Gravity, Urine: 1.014 (ref 1.005–1.030)
UROBILINOGEN UA: 0.2 mg/dL (ref 0.0–1.0)
pH: 7.5 (ref 5.0–8.0)

## 2014-10-15 LAB — COMPREHENSIVE METABOLIC PANEL
ALT: 15 U/L (ref 14–54)
AST: 28 U/L (ref 15–41)
Albumin: 4.5 g/dL (ref 3.5–5.0)
Alkaline Phosphatase: 80 U/L (ref 38–126)
Anion gap: 14 (ref 5–15)
BUN: 5 mg/dL — ABNORMAL LOW (ref 6–20)
CALCIUM: 9.6 mg/dL (ref 8.9–10.3)
CO2: 24 mmol/L (ref 22–32)
CREATININE: 0.9 mg/dL (ref 0.44–1.00)
Chloride: 102 mmol/L (ref 101–111)
GFR calc non Af Amer: >60 mL/min (ref >60)
GLUCOSE: 122 mg/dL — AB (ref 65–99)
Potassium: 3.6 mmol/L (ref 3.5–5.1)
SODIUM: 140 mmol/L (ref 135–145)
TOTAL PROTEIN: 8.4 g/dL — AB (ref 6.5–8.1)
Total Bilirubin: 0.2 mg/dL — ABNORMAL LOW (ref 0.3–1.2)

## 2014-10-15 LAB — I-STAT TROPONIN, ED: Troponin i, poc: 0 ng/mL (ref 0.00–0.08)

## 2014-10-15 LAB — LIPASE, BLOOD: Lipase: 14 U/L — ABNORMAL LOW (ref 22–51)

## 2014-10-15 LAB — POC URINE PREG, ED: Preg Test, Ur: NEGATIVE

## 2014-10-15 LAB — I-STAT CG4 LACTIC ACID, ED: LACTIC ACID, VENOUS: 1.71 mmol/L (ref 0.5–2.0)

## 2014-10-15 MED ORDER — CLONIDINE HCL 0.2 MG PO TABS: 0.2000 mg | ORAL_TABLET | Freq: Once | ORAL | Status: DC

## 2014-10-15 MED ORDER — ONDANSETRON HCL 4 MG PO TABS: 4.0000 mg | ORAL_TABLET | Freq: Four times a day (QID) | ORAL | Status: DC | PRN

## 2014-10-15 MED ORDER — ONDANSETRON HCL 4 MG/2ML IJ SOLN: 4.0000 mg | Freq: Once | INTRAMUSCULAR | Status: AC

## 2014-10-15 MED ORDER — METOPROLOL TARTRATE 25 MG PO TABS
50.0000 mg | ORAL_TABLET | Freq: Once | ORAL | Status: AC
  Administered 2014-10-15: 50 mg via ORAL
  Filled 2014-10-15: qty 2

## 2014-10-15 MED ORDER — ENOXAPARIN SODIUM 40 MG/0.4ML ~~LOC~~ SOLN
40.0000 mg | SUBCUTANEOUS | Status: DC
Start: 2014-10-15 — End: 2014-10-17
  Administered 2014-10-15 – 2014-10-16 (×2): 40 mg via SUBCUTANEOUS
  Filled 2014-10-15 (×3): qty 0.4

## 2014-10-15 MED ORDER — HYDROMORPHONE HCL 1 MG/ML IJ SOLN
1.0000 mg | Freq: Once | INTRAMUSCULAR | Status: AC
  Administered 2014-10-15: 1 mg via INTRAVENOUS
  Filled 2014-10-15: qty 1

## 2014-10-15 MED ORDER — SODIUM CHLORIDE 0.9 % IV BOLUS (SEPSIS)
1000.0000 mL | Freq: Once | INTRAVENOUS | Status: AC
  Administered 2014-10-15: 1000 mL via INTRAVENOUS

## 2014-10-15 MED ORDER — METOPROLOL TARTRATE 1 MG/ML IV SOLN
5.0000 mg | Freq: Four times a day (QID) | INTRAVENOUS | Status: DC
  Administered 2014-10-15 – 2014-10-16 (×3): 5 mg via INTRAVENOUS
  Filled 2014-10-15 (×7): qty 5

## 2014-10-15 MED ORDER — PROMETHAZINE HCL 25 MG/ML IJ SOLN
25.0000 mg | Freq: Once | INTRAMUSCULAR | Status: AC
  Administered 2014-10-15: 25 mg via INTRAVENOUS
  Filled 2014-10-15: qty 1

## 2014-10-15 MED ORDER — ACETAMINOPHEN 650 MG RE SUPP: 650.0000 mg | Freq: Four times a day (QID) | RECTAL | Status: DC | PRN

## 2014-10-15 MED ORDER — CLONIDINE HCL 0.2 MG PO TABS
0.2000 mg | ORAL_TABLET | Freq: Once | ORAL | Status: AC
  Administered 2014-10-15: 0.2 mg via ORAL
  Filled 2014-10-15: qty 1

## 2014-10-15 MED ORDER — SODIUM CHLORIDE 0.9 % IV SOLN
INTRAVENOUS | Status: DC
  Administered 2014-10-15 – 2014-10-16 (×2): via INTRAVENOUS

## 2014-10-15 MED ORDER — ONDANSETRON HCL 4 MG/2ML IJ SOLN
4.0000 mg | Freq: Four times a day (QID) | INTRAMUSCULAR | Status: DC | PRN
  Administered 2014-10-15 – 2014-10-17 (×7): 4 mg via INTRAVENOUS
  Filled 2014-10-15 (×7): qty 2

## 2014-10-15 MED ORDER — ONDANSETRON HCL 4 MG/2ML IJ SOLN
4.0000 mg | Freq: Once | INTRAMUSCULAR | Status: AC
  Administered 2014-10-15: 4 mg via INTRAVENOUS
  Filled 2014-10-15: qty 2

## 2014-10-15 MED ORDER — HYDRALAZINE HCL 20 MG/ML IJ SOLN
10.0000 mg | Freq: Four times a day (QID) | INTRAMUSCULAR | Status: DC | PRN
  Administered 2014-10-15 – 2014-10-16 (×2): 10 mg via INTRAVENOUS
  Filled 2014-10-15 (×2): qty 1

## 2014-10-15 MED ORDER — IOHEXOL 350 MG/ML SOLN
100.0000 mL | Freq: Once | INTRAVENOUS | Status: AC | PRN
  Administered 2014-10-15: 100 mL via INTRAVENOUS

## 2014-10-15 MED ORDER — HYDROMORPHONE HCL 1 MG/ML IJ SOLN
1.0000 mg | INTRAMUSCULAR | Status: DC | PRN
  Administered 2014-10-15 – 2014-10-17 (×10): 1 mg via INTRAVENOUS
  Filled 2014-10-15 (×10): qty 1

## 2014-10-15 MED ORDER — ACETAMINOPHEN 325 MG PO TABS: 650.0000 mg | ORAL_TABLET | Freq: Four times a day (QID) | ORAL | Status: DC | PRN

## 2014-10-15 NOTE — ED Provider Notes (Signed)
CSN: [id number]     Arrival date & time 10/15/14  0501 History   First MD Initiated Contact with Patient 10/15/14 [phone]     Chief Complaint  Patient presents with  . Emesis  . Dysuria     (Consider location/radiation/quality/duration/timing/severity/associated sxs/prior Treatment) HPI Comments: 44 year old female presenting with sudden onset abdominal pain beginning last night. States she was not doing anything in specific when the pain began. Pain is generalized throughout her entire abdomen, more so over her suprapubic area and RUQ, occasionally radiating around to her back. Pain is constant and worse at times, nothing in specific makes her pain worse or better. Admits to nausea and vomiting, reports 10-15 episodes of watery, nonbloody emesis in the last 6 hours. She had 3 episodes of loose, "semi-firm" stools in the past 24 hours. Earlier in the day yesterday she was feeling fine. She had a lean cuisine for dinner. No fevers. Denies chest pain or shortness of breath. May 10, the patient was admitted to ICU for sepsis of unknown origin. At that time, she had a negative chest x-ray and a negative abdominal CT. She was evaluated by surgery who had some concern for her gallbladder, however did not receive an abdominal ultrasound due to pain. Since being discharged from the hospital, patient reports she's been feeling fine and had no problems up until last night. Also admits to dysuria when she urinated this morning and is not producing much urine.  Patient is a 44 y.o. female presenting with vomiting and dysuria. The history is provided by the patient.  Emesis Associated symptoms: abdominal pain and diarrhea   Dysuria Associated symptoms: abdominal pain, nausea and vomiting     Past Medical History  Diagnosis Date  . PTSD (post-traumatic stress disorder)   . DDD (degenerative disc disease), cervical   . Bulging lumbar disc   . Depression   . Hypertension    Past Surgical History  Procedure  Laterality Date  . Hernia repair    . Ablation on endometriosis     No family history on file. History  Substance Use Topics  . Smoking status: Never Smoker   . Smokeless tobacco: Not on file  . Alcohol Use: No   OB History    No data available     Review of Systems  Constitutional: Positive for appetite change and fatigue.  Gastrointestinal: Positive for nausea, vomiting, abdominal pain and diarrhea.  Genitourinary: Positive for dysuria.  Neurological: Positive for weakness.  All other systems reviewed and are negative.     Allergies  Review of patient's allergies indicates no known allergies.  Home Medications   Prior to Admission medications   Medication Sig Start Date End Date Taking? Authorizing Provider  acetaminophen (TYLENOL) 500 MG tablet Take 1,000 mg by mouth every 6 (six) hours as needed for mild pain.   Yes Historical Provider, MD  diazepam (VALIUM) 5 MG tablet Take 5 mg by mouth 3 (three) times daily.   Yes Historical Provider, MD  HYDROcodone-acetaminophen (NORCO) 10-325 MG per tablet Take 1 tablet by mouth every 6 (six) hours as needed for moderate pain.   Yes Historical Provider, MD  ondansetron (ZOFRAN) 8 MG tablet Take 8 mg by mouth every 8 (eight) hours as needed for nausea or vomiting.   Yes Historical Provider, MD  tiZANidine (ZANAFLEX) 4 MG tablet Take 4 mg by mouth every 6 (six) hours as needed for muscle spasms.   Yes Historical Provider, MD   BP 173/98 mmHg  Pulse  118  Temp(Src) 98.8 F (37.1 C) (Oral)  Resp 20  SpO2 94%  LMP 08/24/2014 (Within Days) Physical Exam  Constitutional: She is oriented to person, place, and time. She appears well-developed and well-nourished. She appears ill. She appears distressed.  HENT:  Head: Normocephalic and atraumatic.  Mouth/Throat: Oropharynx is clear and moist.  Eyes: Conjunctivae and EOM are normal.  Neck: Normal range of motion. Neck supple.  Cardiovascular: Regular rhythm and normal heart sounds.   Tachycardia present.   Pulmonary/Chest: Effort normal and breath sounds normal. No respiratory distress.  Abdominal: Normal appearance. She exhibits no distension. There is generalized tenderness. There is guarding.  Generalized tenderness with guarding, more so suprapubic and right upper quadrant with positive Murphy's sign.  Musculoskeletal: Normal range of motion. She exhibits no edema.  Neurological: She is alert and oriented to person, place, and time. No sensory deficit.  Skin: Skin is warm and dry.  Psychiatric: She has a normal mood and affect. Her behavior is normal.  Nursing note and vitals reviewed.   ED Course  Procedures (including critical care time) Labs Review Labs Reviewed  CBC WITH DIFFERENTIAL/PLATELET - Abnormal; Notable for the following:    WBC 13.6 (*)    Neutro Abs 10.5 (*)    Monocytes Absolute 1.2 (*)    All other components within normal limits  COMPREHENSIVE METABOLIC PANEL - Abnormal; Notable for the following:    Glucose, Bld 122 (*)    BUN 5 (*)    Total Protein 8.4 (*)    Total Bilirubin 0.2 (*)    All other components within normal limits  LIPASE, BLOOD - Abnormal; Notable for the following:    Lipase 14 (*)    All other components within normal limits  URINALYSIS, ROUTINE W REFLEX MICROSCOPIC (NOT AT [facility]) - Abnormal; Notable for the following:    Ketones, ur 15 (*)    All other components within normal limits  I-STAT TROPOININ, ED  POC URINE PREG, ED  I-STAT CG4 LACTIC ACID, ED  I-STAT CG4 LACTIC ACID, ED    Imaging Review Ct Angio Chest Pe W/cm &/or Wo Cm  10/15/2014   CLINICAL DATA:  Mid abdominal pain bilaterally with nausea and vomiting since 10/14/2014, endometriosis, shortness of breath  EXAM: CT ANGIOGRAPHY CHEST  CT ABDOMEN AND PELVIS WITH CONTRAST  TECHNIQUE: Multidetector CT imaging of the chest was performed using the standard protocol during bolus administration of intravenous contrast. Multiplanar CT image reconstructions and  MIPs were obtained to evaluate the vascular anatomy. Multidetector CT imaging of the abdomen and pelvis was performed using the standard protocol during bolus administration of intravenous contrast.  CONTRAST:  165mL OMNIPAQUE IOHEXOL 350 MG/ML SOLN IV.  COMPARISON:  CT abdomen and pelvis 09/01/2014  FINDINGS: CTA CHEST FINDINGS  Aorta normal caliber without aneurysm or dissection.  Pulmonary arteries well opacified and patent.  No evidence of pulmonary embolism.  No thoracic adenopathy.  Dependent atelectasis in both lower lobes.  Lungs otherwise clear.  No infiltrate, pleural effusion or pneumothorax.  CT ABDOMEN and PELVIS FINDINGS  Liver, gallbladder, spleen, pancreas, and kidneys normal appearance.  Normal appendix.  Few scattered normal sized mesenteric lymph nodes.  Stomach and bowel loops normal appearance.  Bladder, ureters, uterus, and adnexa unremarkable.  No mass, adenopathy, free fluid, free air, inflammatory process, hernia, or focal bone abnormality.  Review of the MIP images confirms the above findings.  IMPRESSION: Normal exam.   Electronically Signed   By: Crist Infante.D.  On: 10/15/2014 11:22   US Abdomen Complete  10/15/2014   CLINICAL DATA:  Dysuria. Abdominal pain for 4 hours. Multiple episodes of vomiting over the last 6 hours.  EXAM: ULTRASOUND ABDOMEN COMPLETE  COMPARISON:  CT abdomen and pelvis 09/01/2014  FINDINGS: Gallbladder: No gallstones or wall thickening visualized. No sonographic Murphy sign noted. Wall thickness is within normal limits at 1.8 mm.  Common bile duct: Diameter: 3.0 mm, within normal limits  Liver: No focal lesion identified. Within normal limits in parenchymal echogenicity.  IVC: No abnormality visualized.  Pancreas: Poorly visualized due to overlying bowel gas.  Spleen: Size and appearance within normal limits.  Right Kidney: Length: 10.1, within normal limits. Echogenicity within normal limits. No mass or hydronephrosis visualized.  Left Kidney: Length: 10.0  cm, within normal limits. Echogenicity within normal limits. No mass or hydronephrosis visualized.  Abdominal aorta: 1.9 cm, within normal limits  Other findings: None.  IMPRESSION: Negative abdominal ultrasound   Electronically Signed   By: San Morelle M.D.   On: 10/15/2014 08:44   Ct Abdomen Pelvis W Contrast  10/15/2014   CLINICAL DATA:  Mid abdominal pain bilaterally with nausea and vomiting since 10/14/2014, endometriosis, shortness of breath  EXAM: CT ANGIOGRAPHY CHEST  CT ABDOMEN AND PELVIS WITH CONTRAST  TECHNIQUE: Multidetector CT imaging of the chest was performed using the standard protocol during bolus administration of intravenous contrast. Multiplanar CT image reconstructions and MIPs were obtained to evaluate the vascular anatomy. Multidetector CT imaging of the abdomen and pelvis was performed using the standard protocol during bolus administration of intravenous contrast.  CONTRAST:  126mL OMNIPAQUE IOHEXOL 350 MG/ML SOLN IV.  COMPARISON:  CT abdomen and pelvis 09/01/2014  FINDINGS: CTA CHEST FINDINGS  Aorta normal caliber without aneurysm or dissection.  Pulmonary arteries well opacified and patent.  No evidence of pulmonary embolism.  No thoracic adenopathy.  Dependent atelectasis in both lower lobes.  Lungs otherwise clear.  No infiltrate, pleural effusion or pneumothorax.  CT ABDOMEN and PELVIS FINDINGS  Liver, gallbladder, spleen, pancreas, and kidneys normal appearance.  Normal appendix.  Few scattered normal sized mesenteric lymph nodes.  Stomach and bowel loops normal appearance.  Bladder, ureters, uterus, and adnexa unremarkable.  No mass, adenopathy, free fluid, free air, inflammatory process, hernia, or focal bone abnormality.  Review of the MIP images confirms the above findings.  IMPRESSION: Normal exam.   Electronically Signed   By: Lavonia Dana M.D.   On: 10/15/2014 11:22   Dg Abd Acute W/chest  10/15/2014   CLINICAL DATA:  Dysuria and vomiting beginning this morning.  Initial encounter.  EXAM: DG ABDOMEN ACUTE W/ 1V CHEST  COMPARISON:  CT abdomen and pelvis 09/01/2014. Single view of the chest 08/31/2014.  FINDINGS: Single view of the chest demonstrates clear lungs and normal heart size. No pneumothorax or pleural effusion.  Two views of the abdomen show no free intraperitoneal air. The bowel gas pattern is normal. No unexpected abdominal calcification is seen. No bony abnormality is identified.  IMPRESSION: Negative exam.   Electronically Signed   By: Inge Rise M.D.   On: 10/15/2014 07:28     EKG Interpretation None      MDM   Final diagnoses:  Intractable abdominal pain  Intractable vomiting with nausea, vomiting of unspecified type  Tachycardia  Hypertensive urgency   Uncomfortable appearing and in mild distress. Afebrile. Tachycardic and hypertensive. Recent admission to the ICU as stated above. Abdomen is diffusely tender with guarding, more so upper abdomen. Acute  abdominal series negative. Abdominal ultrasound without acute finding. Labs consistent with leukocytosis of 13.6. Normal lactic acid. UA negative. On recheck patient, she is still diffusely tender with guarding, still tachycardic and now complaining of shortness of breath. Plan to obtain repeat abdominal CT along with CT angiogram to rule out PE.  Imaging studies negative. Patient continues to have intractable pain, nausea, vomiting and tachycardia. Patient will be admitted for further care. Admission accepted by Dr. Darrick Meigs, [facility].  Discussed with attending Dr. Vanita Panda who also evaluated patient and agrees with plan of care.   Carman Ching, PA-C 10/15/14 1529  Carmin Muskrat, MD 10/15/14 [phone]

## 2014-10-15 NOTE — Progress Notes (Signed)
Pt vomited greenish brown bile looking liquid twice.  It was about 30 ml each time. Gave Zofran.... will monitor

## 2014-10-15 NOTE — ED Notes (Signed)
Attempted report 

## 2014-10-15 NOTE — ED Notes (Signed)
Notified Robyn PA-C that pt refused lactic acid. Pt will be stuck and cathed upon return from [place]

## 2014-10-15 NOTE — H&P (Signed)
PCP:   Leonard Downing, MD   Chief Complaint:  Abdominal pain  HPI:  44 year old female who  has a past medical history of PTSD (post-traumatic stress disorder); DDD (degenerative disc disease), cervical; Bulging lumbar disc; Depression; and Hypertension. today came to the hospital with chief complaint of sudden onset abdominal pain, which started last night. Patient has a history of chronic abdominal pain and was seen by surgery last month when she was admitted to the hospital. Flow she admits to having nausea and vomiting about 2-15 episodes of watery nonbloody emesis the past 6 hours. Also had 3 loose stools. In the previous admission there was a concern that patient may have some gallbladder pathology so abdominal ultrasound done in the ED today was negative. Patient also had CT angiogram  of the chest was negative for pulmonary embolism today. CT abdomen pelvis also was negative in the ED. Patient found to be in hypertensive urgency. Admits to having generalize pains including chest abdominal and also shortness of breath. Patient also complained of dysuria, UA was negative for nitrite or WBCs.  Allergies:  No Known Allergies    Past Medical History  Diagnosis Date  . PTSD (post-traumatic stress disorder)   . DDD (degenerative disc disease), cervical   . Bulging lumbar disc   . Depression   . Hypertension     Past Surgical History  Procedure Laterality Date  . Hernia repair    . Ablation on endometriosis      Prior to Admission medications   Medication Sig Start Date End Date Taking? Authorizing Provider  acetaminophen (TYLENOL) 500 MG tablet Take 1,000 mg by mouth every 6 (six) hours as needed for mild pain.   Yes Historical Provider, MD  diazepam (VALIUM) 5 MG tablet Take 5 mg by mouth 3 (three) times daily.   Yes Historical Provider, MD  HYDROcodone-acetaminophen (NORCO) 10-325 MG per tablet Take 1 tablet by mouth every 6 (six) hours as needed for moderate pain.    Yes Historical Provider, MD  ondansetron (ZOFRAN) 8 MG tablet Take 8 mg by mouth every 8 (eight) hours as needed for nausea or vomiting.   Yes Historical Provider, MD  tiZANidine (ZANAFLEX) 4 MG tablet Take 4 mg by mouth every 6 (six) hours as needed for muscle spasms.   Yes Historical Provider, MD    Social History:  reports that she has never smoked. She does not have any smokeless tobacco history on file. She reports that she does not drink alcohol. Her drug history is not on file.  Positive family history of heart disease  All the positives are listed in BOLD  Review of Systems:  HEENT: Headache, blurred vision, runny nose, sore throat Neck: Hypothyroidism, hyperthyroidism,,lymphadenopathy Chest : Shortness of breath, history of COPD, Asthma Heart : Chest pain, history of coronary arterey disease GI:  Nausea, vomiting, diarrhea, constipation, GERD GU: Dysuria, urgency, frequency of urination, hematuria Neuro: Stroke, seizures, syncope Psych: Depression, anxiety, hallucinations   Physical Exam: Blood pressure 193/138, pulse 109, temperature 98.8 F (37.1 C), temperature source Oral, resp. rate 16, last menstrual period 08/24/2014, SpO2 94 %. Constitutional:   Patient is a well-developed and well-nourished female* in no acute distress and cooperative with exam. Head: Normocephalic and atraumatic Mouth: Mucus membranes moist Eyes: PERRL, EOMI, conjunctivae normal Neck: Supple, No Thyromegaly Cardiovascular: RRR, S1 normal, S2 normal Pulmonary/Chest: CTAB, no wheezes, rales, or rhonchi Abdominal: Soft. Non-tender, non-distended, bowel sounds are normal, no masses, organomegaly, or guarding present.  Neurological: A&O x3,  Strength is normal and symmetric bilaterally, cranial nerve II-XII are grossly intact, no focal motor deficit, sensory intact to light touch bilaterally.  Extremities : No Cyanosis, Clubbing or Edema  Labs on Admission:  Basic Metabolic Panel:  Recent  Labs Lab 10/15/14 0515  NA 140  K 3.6  CL 102  CO2 24  GLUCOSE 122*  BUN 5*  CREATININE 0.90  CALCIUM 9.6   Liver Function Tests:  Recent Labs Lab 10/15/14 0515  AST 28  ALT 15  ALKPHOS 80  BILITOT 0.2*  PROT 8.4*  ALBUMIN 4.5    Recent Labs Lab 10/15/14 0515  LIPASE 14*   No results for input(s): AMMONIA in the last 168 hours. CBC:  Recent Labs Lab 10/15/14 0515  WBC 13.6*  NEUTROABS 10.5*  HGB 14.5  HCT 44.2  MCV 94.8  PLT 322    Radiological Exams on Admission: Ct Angio Chest Pe W/cm &/or Wo Cm  10/15/2014   CLINICAL DATA:  Mid abdominal pain bilaterally with nausea and vomiting since 10/14/2014, endometriosis, shortness of breath  EXAM: CT ANGIOGRAPHY CHEST  CT ABDOMEN AND PELVIS WITH CONTRAST  TECHNIQUE: Multidetector CT imaging of the chest was performed using the standard protocol during bolus administration of intravenous contrast. Multiplanar CT image reconstructions and MIPs were obtained to evaluate the vascular anatomy. Multidetector CT imaging of the abdomen and pelvis was performed using the standard protocol during bolus administration of intravenous contrast.  CONTRAST:  132mL OMNIPAQUE IOHEXOL 350 MG/ML SOLN IV.  COMPARISON:  CT abdomen and pelvis 09/01/2014  FINDINGS: CTA CHEST FINDINGS  Aorta normal caliber without aneurysm or dissection.  Pulmonary arteries well opacified and patent.  No evidence of pulmonary embolism.  No thoracic adenopathy.  Dependent atelectasis in both lower lobes.  Lungs otherwise clear.  No infiltrate, pleural effusion or pneumothorax.  CT ABDOMEN and PELVIS FINDINGS  Liver, gallbladder, spleen, pancreas, and kidneys normal appearance.  Normal appendix.  Few scattered normal sized mesenteric lymph nodes.  Stomach and bowel loops normal appearance.  Bladder, ureters, uterus, and adnexa unremarkable.  No mass, adenopathy, free fluid, free air, inflammatory process, hernia, or focal bone abnormality.  Review of the MIP images  confirms the above findings.  IMPRESSION: Normal exam.   Electronically Signed   By: Lavonia Dana M.D.   On: 10/15/2014 11:22   US Abdomen Complete  10/15/2014   CLINICAL DATA:  Dysuria. Abdominal pain for 4 hours. Multiple episodes of vomiting over the last 6 hours.  EXAM: ULTRASOUND ABDOMEN COMPLETE  COMPARISON:  CT abdomen and pelvis 09/01/2014  FINDINGS: Gallbladder: No gallstones or wall thickening visualized. No sonographic Murphy sign noted. Wall thickness is within normal limits at 1.8 mm.  Common bile duct: Diameter: 3.0 mm, within normal limits  Liver: No focal lesion identified. Within normal limits in parenchymal echogenicity.  IVC: No abnormality visualized.  Pancreas: Poorly visualized due to overlying bowel gas.  Spleen: Size and appearance within normal limits.  Right Kidney: Length: 10.1, within normal limits. Echogenicity within normal limits. No mass or hydronephrosis visualized.  Left Kidney: Length: 10.0 cm, within normal limits. Echogenicity within normal limits. No mass or hydronephrosis visualized.  Abdominal aorta: 1.9 cm, within normal limits  Other findings: None.  IMPRESSION: Negative abdominal ultrasound   Electronically Signed   By: San Morelle M.D.   On: 10/15/2014 08:44   Ct Abdomen Pelvis W Contrast  10/15/2014   CLINICAL DATA:  Mid abdominal pain bilaterally with nausea and vomiting since 10/14/2014, endometriosis, shortness  of breath  EXAM: CT ANGIOGRAPHY CHEST  CT ABDOMEN AND PELVIS WITH CONTRAST  TECHNIQUE: Multidetector CT imaging of the chest was performed using the standard protocol during bolus administration of intravenous contrast. Multiplanar CT image reconstructions and MIPs were obtained to evaluate the vascular anatomy. Multidetector CT imaging of the abdomen and pelvis was performed using the standard protocol during bolus administration of intravenous contrast.  CONTRAST:  134mL OMNIPAQUE IOHEXOL 350 MG/ML SOLN IV.  COMPARISON:  CT abdomen and pelvis  09/01/2014  FINDINGS: CTA CHEST FINDINGS  Aorta normal caliber without aneurysm or dissection.  Pulmonary arteries well opacified and patent.  No evidence of pulmonary embolism.  No thoracic adenopathy.  Dependent atelectasis in both lower lobes.  Lungs otherwise clear.  No infiltrate, pleural effusion or pneumothorax.  CT ABDOMEN and PELVIS FINDINGS  Liver, gallbladder, spleen, pancreas, and kidneys normal appearance.  Normal appendix.  Few scattered normal sized mesenteric lymph nodes.  Stomach and bowel loops normal appearance.  Bladder, ureters, uterus, and adnexa unremarkable.  No mass, adenopathy, free fluid, free air, inflammatory process, hernia, or focal bone abnormality.  Review of the MIP images confirms the above findings.  IMPRESSION: Normal exam.   Electronically Signed   By: Lavonia Dana M.D.   On: 10/15/2014 11:22   Dg Abd Acute W/chest  10/15/2014   CLINICAL DATA:  Dysuria and vomiting beginning this morning. Initial encounter.  EXAM: DG ABDOMEN ACUTE W/ 1V CHEST  COMPARISON:  CT abdomen and pelvis 09/01/2014. Single view of the chest 08/31/2014.  FINDINGS: Single view of the chest demonstrates clear lungs and normal heart size. No pneumothorax or pleural effusion.  Two views of the abdomen show no free intraperitoneal air. The bowel gas pattern is normal. No unexpected abdominal calcification is seen. No bony abnormality is identified.  IMPRESSION: Negative exam.   Electronically Signed   By: Inge Rise M.D.   On: 10/15/2014 07:28    EKG: Independently reviewed. Sinus tachycardia   Assessment/Plan Active Problems:   Nausea vomiting and diarrhea   Intractable abdominal pain   Abdominal pain  Abdominal pain Patient has history of chronic abdominal pain, although workup has been negative. Will admit the patient for IV fluids and pain control. Start Zofran when necessary for nausea and vomiting. Start Dilaudid 1 mg every 4 hours when necessary for pain Lactic acid 1.71 today,  lipase 14.  Intractable nausea vomiting As above will start the patient on Zofran when necessary  Hypertensive urgency Patient came with elevated blood pressure 194/111, will start the patient on metoprolol 5 mg every 6 hours scheduled and IV hydralazine 10 mg every 6 hours when necessary for BP greater than 160/100.  Code status: Full code  Family discussion: No family at bedside   Time Spent on Admission: 60 min  [facility] Pager: [phone] 10/15/2014, 2:23 PM  If 7PM-7AM, please contact night-coverage  [email or web address]  Password [email or web address]

## 2014-10-15 NOTE — ED Notes (Signed)
Pt arrives with c/o emesis, abd pain and dysuria ongoing since about six hours ago, states pain is all over stomach. Emesis x10-15 times in the last 6 hours, denies blood in vomit. States semi firm stools, states 3 stools in 24 hours.

## 2014-10-15 NOTE — Plan of Care (Signed)
Problem: Phase III Progression Outcomes Goal: Pain controlled on oral analgesia Outcome: Progressing Pain medication given as ordered

## 2014-10-16 DIAGNOSIS — R112 Nausea with vomiting, unspecified: Principal | ICD-10-CM

## 2014-10-16 DIAGNOSIS — R197 Diarrhea, unspecified: Secondary | ICD-10-CM

## 2014-10-16 DIAGNOSIS — I10 Essential (primary) hypertension: Secondary | ICD-10-CM

## 2014-10-16 DIAGNOSIS — R1084 Generalized abdominal pain: Secondary | ICD-10-CM

## 2014-10-16 LAB — COMPREHENSIVE METABOLIC PANEL
ALK PHOS: 58 U/L (ref 38–126)
ALT: 12 U/L — AB (ref 14–54)
ANION GAP: 12 (ref 5–15)
AST: 15 U/L (ref 15–41)
Albumin: 3.6 g/dL (ref 3.5–5.0)
BUN: 5 mg/dL — ABNORMAL LOW (ref 6–20)
CHLORIDE: 105 mmol/L (ref 101–111)
CO2: 24 mmol/L (ref 22–32)
CREATININE: 0.68 mg/dL (ref 0.44–1.00)
Calcium: 9.2 mg/dL (ref 8.9–10.3)
GFR calc Af Amer: >60 mL/min (ref >60)
GFR calc non Af Amer: >60 mL/min (ref >60)
GLUCOSE: 104 mg/dL — AB (ref 65–99)
Potassium: 3.3 mmol/L — ABNORMAL LOW (ref 3.5–5.1)
Sodium: 141 mmol/L (ref 135–145)
TOTAL PROTEIN: 6.8 g/dL (ref 6.5–8.1)
Total Bilirubin: 0.6 mg/dL (ref 0.3–1.2)

## 2014-10-16 LAB — CBC
HCT: 39 % (ref 36.0–46.0)
Hemoglobin: 12.8 g/dL (ref 12.0–15.0)
MCH: 30.8 pg (ref 26.0–34.0)
MCHC: 32.8 g/dL (ref 30.0–36.0)
MCV: 93.8 fL (ref 78.0–100.0)
Platelets: 274 10*3/uL (ref 150–400)
RBC: 4.16 MIL/uL (ref 3.87–5.11)
RDW: 12.6 % (ref 11.5–15.5)
WBC: 10.3 10*3/uL (ref 4.0–10.5)

## 2014-10-16 MED ORDER — AMLODIPINE BESYLATE 10 MG PO TABS
10.0000 mg | ORAL_TABLET | Freq: Every day | ORAL | Status: DC
  Administered 2014-10-16 – 2014-10-17 (×2): 10 mg via ORAL
  Filled 2014-10-16 (×2): qty 1

## 2014-10-16 MED ORDER — POTASSIUM CHLORIDE CRYS ER 20 MEQ PO TBCR
40.0000 meq | EXTENDED_RELEASE_TABLET | Freq: Four times a day (QID) | ORAL | Status: AC
  Administered 2014-10-16 (×2): 40 meq via ORAL
  Filled 2014-10-16 (×2): qty 2

## 2014-10-16 NOTE — Progress Notes (Signed)
[facility] PROGRESS NOTE  [name] [id number] DOB: 1970-08-29 DOA: 10/15/2014 PCP: Leonard Downing, MD  Assessment/Plan: 1. Nausea, vomiting, diarrhea -Patient presenting with intractable nausea and vomiting associate with abdominal pain. I suspect symptoms are related to viral syndrome. She was further worked up with a CT scan of abdomen and pelvis that was negative. Will continue providing supportive care, IV fluid resuscitation, as needed IV antibiotic therapy -Advance her diet today as tolerated  2.  Hypertensive urgency. -Patient presented with blood pressure of 194/111 for which she was given IV hydralazine. -blood pressures have improved with last blood pressure 140/102. -Will start Norvasc 10 mg by mouth daily  3.  Dehydration.  -Likely secondary to GI losses, continue IV fluid resuscitation with normal saline at 150 mL/hour. I think this likely explains sinus tachycardia   Code Status: full code Family Communication:  Disposition Plan: anticipate discharge home when medically stable    HPI/Subjective: Patient is a pleasant 44 year old female admitted to medicine service and 6 06/25/2014 which she presented to with complaints of abdominal pain associate with multiple episodes of nausea and vomiting and diarrhea. Patient unable to hold by mouth down for which she was admitted for IV fluid hydration and supportive care. She had reported shortness of breath which was further worked up with a CT scan of lungs with IV contrast that was negative for pulmonary embolism.CT scan abdomen and pelvis was negative as well. She reported having some improvement as her diet was advanced and following morning.  Objective: Filed Vitals:   10/16/14 1549  BP: 140/102  Pulse: 116  Temp:   Resp: 18   No intake or output data in the 24 hours ending 10/16/14 1625 Filed Weights   10/16/14 0527  Weight: 76.749 kg (169 lb 3.2 oz)    Exam:   General:  Patient is  ill-appearing, no acute distress though, she is awake and alert oriented 3  Cardiovascular: tachycardic, regular rate rhythm normal S1-S2  Respiratory: normal respiratory effort, lungs are clear to auscultation bilaterally  Abdomen: she has mild generalized tenderness to palpation over abdomen, positive bowel sounds no rebound tenderness or guarding  Musculoskeletal: no extremity edema  Data Reviewed: Basic Metabolic Panel:  Recent Labs Lab 10/15/14 0515 10/16/14 0500  NA 140 141  K 3.6 3.3*  CL 102 105  CO2 24 24  GLUCOSE 122* 104*  BUN 5* 5*  CREATININE 0.90 0.68  CALCIUM 9.6 9.2   Liver Function Tests:  Recent Labs Lab 10/15/14 0515 10/16/14 0500  AST 28 15  ALT 15 12*  ALKPHOS 80 58  BILITOT 0.2* 0.6  PROT 8.4* 6.8  ALBUMIN 4.5 3.6    Recent Labs Lab 10/15/14 0515  LIPASE 14*   No results for input(s): AMMONIA in the last 168 hours. CBC:  Recent Labs Lab 10/15/14 0515 10/16/14 0500  WBC 13.6* 10.3  NEUTROABS 10.5*  --   HGB 14.5 12.8  HCT 44.2 39.0  MCV 94.8 93.8  PLT 322 274   Cardiac Enzymes: No results for input(s): CKTOTAL, CKMB, CKMBINDEX, TROPONINI in the last 168 hours. BNP (last 3 results) No results for input(s): BNP in the last 8760 hours.  ProBNP (last 3 results) No results for input(s): PROBNP in the last 8760 hours.  CBG: No results for input(s): GLUCAP in the last 168 hours.  No results found for this or any previous visit (from the past 240 hour(s)).   Studies: Ct Angio Chest Pe W/cm &/or Wo Cm  10/15/2014  CLINICAL DATA:  Mid abdominal pain bilaterally with nausea and vomiting since 10/14/2014, endometriosis, shortness of breath  EXAM: CT ANGIOGRAPHY CHEST  CT ABDOMEN AND PELVIS WITH CONTRAST  TECHNIQUE: Multidetector CT imaging of the chest was performed using the standard protocol during bolus administration of intravenous contrast. Multiplanar CT image reconstructions and MIPs were obtained to evaluate the vascular  anatomy. Multidetector CT imaging of the abdomen and pelvis was performed using the standard protocol during bolus administration of intravenous contrast.  CONTRAST:  180mL OMNIPAQUE IOHEXOL 350 MG/ML SOLN IV.  COMPARISON:  CT abdomen and pelvis 09/01/2014  FINDINGS: CTA CHEST FINDINGS  Aorta normal caliber without aneurysm or dissection.  Pulmonary arteries well opacified and patent.  No evidence of pulmonary embolism.  No thoracic adenopathy.  Dependent atelectasis in both lower lobes.  Lungs otherwise clear.  No infiltrate, pleural effusion or pneumothorax.  CT ABDOMEN and PELVIS FINDINGS  Liver, gallbladder, spleen, pancreas, and kidneys normal appearance.  Normal appendix.  Few scattered normal sized mesenteric lymph nodes.  Stomach and bowel loops normal appearance.  Bladder, ureters, uterus, and adnexa unremarkable.  No mass, adenopathy, free fluid, free air, inflammatory process, hernia, or focal bone abnormality.  Review of the MIP images confirms the above findings.  IMPRESSION: Normal exam.   Electronically Signed   By: Lavonia Dana M.D.   On: 10/15/2014 11:22   US Abdomen Complete  10/15/2014   CLINICAL DATA:  Dysuria. Abdominal pain for 4 hours. Multiple episodes of vomiting over the last 6 hours.  EXAM: ULTRASOUND ABDOMEN COMPLETE  COMPARISON:  CT abdomen and pelvis 09/01/2014  FINDINGS: Gallbladder: No gallstones or wall thickening visualized. No sonographic Murphy sign noted. Wall thickness is within normal limits at 1.8 mm.  Common bile duct: Diameter: 3.0 mm, within normal limits  Liver: No focal lesion identified. Within normal limits in parenchymal echogenicity.  IVC: No abnormality visualized.  Pancreas: Poorly visualized due to overlying bowel gas.  Spleen: Size and appearance within normal limits.  Right Kidney: Length: 10.1, within normal limits. Echogenicity within normal limits. No mass or hydronephrosis visualized.  Left Kidney: Length: 10.0 cm, within normal limits. Echogenicity within  normal limits. No mass or hydronephrosis visualized.  Abdominal aorta: 1.9 cm, within normal limits  Other findings: None.  IMPRESSION: Negative abdominal ultrasound   Electronically Signed   By: San Morelle M.D.   On: 10/15/2014 08:44   Ct Abdomen Pelvis W Contrast  10/15/2014   CLINICAL DATA:  Mid abdominal pain bilaterally with nausea and vomiting since 10/14/2014, endometriosis, shortness of breath  EXAM: CT ANGIOGRAPHY CHEST  CT ABDOMEN AND PELVIS WITH CONTRAST  TECHNIQUE: Multidetector CT imaging of the chest was performed using the standard protocol during bolus administration of intravenous contrast. Multiplanar CT image reconstructions and MIPs were obtained to evaluate the vascular anatomy. Multidetector CT imaging of the abdomen and pelvis was performed using the standard protocol during bolus administration of intravenous contrast.  CONTRAST:  179mL OMNIPAQUE IOHEXOL 350 MG/ML SOLN IV.  COMPARISON:  CT abdomen and pelvis 09/01/2014  FINDINGS: CTA CHEST FINDINGS  Aorta normal caliber without aneurysm or dissection.  Pulmonary arteries well opacified and patent.  No evidence of pulmonary embolism.  No thoracic adenopathy.  Dependent atelectasis in both lower lobes.  Lungs otherwise clear.  No infiltrate, pleural effusion or pneumothorax.  CT ABDOMEN and PELVIS FINDINGS  Liver, gallbladder, spleen, pancreas, and kidneys normal appearance.  Normal appendix.  Few scattered normal sized mesenteric lymph nodes.  Stomach and bowel loops  normal appearance.  Bladder, ureters, uterus, and adnexa unremarkable.  No mass, adenopathy, free fluid, free air, inflammatory process, hernia, or focal bone abnormality.  Review of the MIP images confirms the above findings.  IMPRESSION: Normal exam.   Electronically Signed   By: Lavonia Dana M.D.   On: 10/15/2014 11:22   Dg Abd Acute W/chest  10/15/2014   CLINICAL DATA:  Dysuria and vomiting beginning this morning. Initial encounter.  EXAM: DG ABDOMEN ACUTE W/ 1V  CHEST  COMPARISON:  CT abdomen and pelvis 09/01/2014. Single view of the chest 08/31/2014.  FINDINGS: Single view of the chest demonstrates clear lungs and normal heart size. No pneumothorax or pleural effusion.  Two views of the abdomen show no free intraperitoneal air. The bowel gas pattern is normal. No unexpected abdominal calcification is seen. No bony abnormality is identified.  IMPRESSION: Negative exam.   Electronically Signed   By: Inge Rise M.D.   On: 10/15/2014 07:28    Scheduled Meds: . enoxaparin (LOVENOX) injection  40 mg Subcutaneous Q24H  . metoprolol  5 mg Intravenous 4 times per day  . potassium chloride  40 mEq Oral Q6H   Continuous Infusions: . sodium chloride 100 mL/hr at 10/16/14 [id number]    Active Problems:   Nausea vomiting and diarrhea   Intractable abdominal pain   Abdominal pain    Time spent: 30 minutes    Kelvin Cellar  [facility] Pager [phone]. If 7PM-7AM, please contact night-coverage at [email or web address], password [email or web address] 10/16/2014, 4:25 PM  LOS: 1 day

## 2014-10-16 NOTE — Progress Notes (Signed)
Utilization review completed.  

## 2014-10-17 MED ORDER — AMLODIPINE BESYLATE 10 MG PO TABS: 10.0000 mg | ORAL_TABLET | Freq: Every day | ORAL | Status: DC

## 2014-10-17 NOTE — Discharge Summary (Signed)
Physician Discharge Summary  [name] [id number] DOB: [birth year] DOA: 10/15/2014  PCP: Leonard Downing, MD  Admit date: 10/15/2014 Discharge date: 10/17/2014  Time spent: 35 minutes  Recommendations for Outpatient Follow-up:  1. Patient admitted for N/V/D, likely secondary to viral illness. Improved  With supportive care and IV fluids 2. Please follow up on blood pressures, she was discharged on Norvasc 10 mg PO q daily. Presented with elevated blood pressures.   Discharge Diagnoses:  Active Problems:   Nausea vomiting and diarrhea   Intractable abdominal pain   Abdominal pain   Discharge Condition: Stable  Diet recommendation: Heart Healthy  Filed Weights   10/16/14 0527  Weight: 76.749 kg (169 lb 3.2 oz)    History of present illness:  44 year old female who  has a past medical history of PTSD (post-traumatic stress disorder); DDD (degenerative disc disease), cervical; Bulging lumbar disc; Depression; and Hypertension. today came to the hospital with chief complaint of sudden onset abdominal pain, which started last night. Patient has a history of chronic abdominal pain and was seen by surgery last month when she was admitted to the hospital. Flow she admits to having nausea and vomiting about 2-15 episodes of watery nonbloody emesis the past 6 hours. Also had 3 loose stools. In the previous admission there was a concern that patient may have some gallbladder pathology so abdominal ultrasound done in the ED today was negative. Patient also had CT angiogram of the chest was negative for pulmonary embolism today. CT abdomen pelvis also was negative in the ED. Patient found to be in hypertensive urgency. Admits to having generalize pains including chest abdominal and also shortness of breath. Patient also complained of dysuria, UA was negative for nitrite or WBCs.  Hospital Course:  Patient is a pleasant 44 year old female admitted to medicine service and 6 06/25/2014  which she presented to with complaints of abdominal pain associate with multiple episodes of nausea and vomiting and diarrhea. Patient unable to hold by mouth down for which she was admitted for IV fluid hydration and supportive care. She had reported shortness of breath which was further worked up with a CT scan of lungs with IV contrast that was negative for pulmonary embolism.CT scan abdomen and pelvis was negative as well. She reported having some improvement as her diet was advanced and following morning. By 10/17/2014 she was doing well, tolerating PO intake. She was discharge to her home in stable condition on 10/17/2014. She was discharged on Norvasc 10 mg PO q daily as her blood pressures were elevated during this hospitalization.   Discharge Exam: Filed Vitals:   10/17/14 0512  BP: 147/109  Pulse: 111  Temp: 98.4 F (36.9 C)  Resp: 16     General: Patient appears much better, she is awake and alert, states feeling better, feels ready to go home  Cardiovascular: tachycardic, regular rate rhythm normal S1-S2  Respiratory: normal respiratory effort, lungs are clear to auscultation bilaterally  Abdomen: improved abdominal symptoms, abd overall soft, nontender nondistended  Musculoskeletal: no extremity edema  Discharge Instructions   Discharge Instructions    Call MD for:  difficulty breathing, headache or visual disturbances    Complete by:  As directed      Call MD for:  extreme fatigue    Complete by:  As directed      Call MD for:  hives    Complete by:  As directed      Call MD for:  persistant dizziness or light-headedness  Complete by:  As directed      Call MD for:  persistant nausea and vomiting    Complete by:  As directed      Call MD for:  redness, tenderness, or signs of infection (pain, swelling, redness, odor or green/yellow discharge around incision site)    Complete by:  As directed      Call MD for:  severe uncontrolled pain    Complete by:  As directed       Call MD for:  temperature >100.4    Complete by:  As directed      Call MD for:    Complete by:  As directed      Diet - low sodium heart healthy    Complete by:  As directed      Increase activity slowly    Complete by:  As directed           Current Discharge Medication List    START taking these medications   Details  amLODipine (NORVASC) 10 MG tablet Take 1 tablet (10 mg total) by mouth daily. Qty: 30 tablet, Refills: 2      CONTINUE these medications which have NOT CHANGED   Details  diazepam (VALIUM) 5 MG tablet Take 5 mg by mouth 3 (three) times daily.    HYDROcodone-acetaminophen (NORCO) 10-325 MG per tablet Take 1 tablet by mouth every 6 (six) hours as needed for moderate pain.    tiZANidine (ZANAFLEX) 4 MG tablet Take 4 mg by mouth every 6 (six) hours as needed for muscle spasms.      STOP taking these medications     acetaminophen (TYLENOL) 500 MG tablet      ondansetron (ZOFRAN) 8 MG tablet        No Known Allergies Follow-up Information    Follow up with Leonard Downing, MD In 1 week.   Specialty:  Family Medicine   Contact information:   [place] [address] [phone]        The results of significant diagnostics from this hospitalization (including imaging, microbiology, ancillary and laboratory) are listed below for reference.    Significant Diagnostic Studies: Ct Angio Chest Pe W/cm &/or Wo Cm  10/15/2014   CLINICAL DATA:  Mid abdominal pain bilaterally with nausea and vomiting since 10/14/2014, endometriosis, shortness of breath  EXAM: CT ANGIOGRAPHY CHEST  CT ABDOMEN AND PELVIS WITH CONTRAST  TECHNIQUE: Multidetector CT imaging of the chest was performed using the standard protocol during bolus administration of intravenous contrast. Multiplanar CT image reconstructions and MIPs were obtained to evaluate the vascular anatomy. Multidetector CT imaging of the abdomen and pelvis was performed using the standard  protocol during bolus administration of intravenous contrast.  CONTRAST:  139mL OMNIPAQUE IOHEXOL 350 MG/ML SOLN IV.  COMPARISON:  CT abdomen and pelvis 09/01/2014  FINDINGS: CTA CHEST FINDINGS  Aorta normal caliber without aneurysm or dissection.  Pulmonary arteries well opacified and patent.  No evidence of pulmonary embolism.  No thoracic adenopathy.  Dependent atelectasis in both lower lobes.  Lungs otherwise clear.  No infiltrate, pleural effusion or pneumothorax.  CT ABDOMEN and PELVIS FINDINGS  Liver, gallbladder, spleen, pancreas, and kidneys normal appearance.  Normal appendix.  Few scattered normal sized mesenteric lymph nodes.  Stomach and bowel loops normal appearance.  Bladder, ureters, uterus, and adnexa unremarkable.  No mass, adenopathy, free fluid, free air, inflammatory process, hernia, or focal bone abnormality.  Review of the MIP images confirms the above findings.  IMPRESSION: Normal exam.   Electronically Signed   By: Lavonia Dana M.D.   On: 10/15/2014 11:22   US Abdomen Complete  10/15/2014   CLINICAL DATA:  Dysuria. Abdominal pain for 4 hours. Multiple episodes of vomiting over the last 6 hours.  EXAM: ULTRASOUND ABDOMEN COMPLETE  COMPARISON:  CT abdomen and pelvis 09/01/2014  FINDINGS: Gallbladder: No gallstones or wall thickening visualized. No sonographic Murphy sign noted. Wall thickness is within normal limits at 1.8 mm.  Common bile duct: Diameter: 3.0 mm, within normal limits  Liver: No focal lesion identified. Within normal limits in parenchymal echogenicity.  IVC: No abnormality visualized.  Pancreas: Poorly visualized due to overlying bowel gas.  Spleen: Size and appearance within normal limits.  Right Kidney: Length: 10.1, within normal limits. Echogenicity within normal limits. No mass or hydronephrosis visualized.  Left Kidney: Length: 10.0 cm, within normal limits. Echogenicity within normal limits. No mass or hydronephrosis visualized.  Abdominal aorta: 1.9 cm, within normal  limits  Other findings: None.  IMPRESSION: Negative abdominal ultrasound   Electronically Signed   By: San Morelle M.D.   On: 10/15/2014 08:44   Ct Abdomen Pelvis W Contrast  10/15/2014   CLINICAL DATA:  Mid abdominal pain bilaterally with nausea and vomiting since 10/14/2014, endometriosis, shortness of breath  EXAM: CT ANGIOGRAPHY CHEST  CT ABDOMEN AND PELVIS WITH CONTRAST  TECHNIQUE: Multidetector CT imaging of the chest was performed using the standard protocol during bolus administration of intravenous contrast. Multiplanar CT image reconstructions and MIPs were obtained to evaluate the vascular anatomy. Multidetector CT imaging of the abdomen and pelvis was performed using the standard protocol during bolus administration of intravenous contrast.  CONTRAST:  117mL OMNIPAQUE IOHEXOL 350 MG/ML SOLN IV.  COMPARISON:  CT abdomen and pelvis 09/01/2014  FINDINGS: CTA CHEST FINDINGS  Aorta normal caliber without aneurysm or dissection.  Pulmonary arteries well opacified and patent.  No evidence of pulmonary embolism.  No thoracic adenopathy.  Dependent atelectasis in both lower lobes.  Lungs otherwise clear.  No infiltrate, pleural effusion or pneumothorax.  CT ABDOMEN and PELVIS FINDINGS  Liver, gallbladder, spleen, pancreas, and kidneys normal appearance.  Normal appendix.  Few scattered normal sized mesenteric lymph nodes.  Stomach and bowel loops normal appearance.  Bladder, ureters, uterus, and adnexa unremarkable.  No mass, adenopathy, free fluid, free air, inflammatory process, hernia, or focal bone abnormality.  Review of the MIP images confirms the above findings.  IMPRESSION: Normal exam.   Electronically Signed   By: Lavonia Dana M.D.   On: 10/15/2014 11:22   Dg Abd Acute W/chest  10/15/2014   CLINICAL DATA:  Dysuria and vomiting beginning this morning. Initial encounter.  EXAM: DG ABDOMEN ACUTE W/ 1V CHEST  COMPARISON:  CT abdomen and pelvis 09/01/2014. Single view of the chest 08/31/2014.   FINDINGS: Single view of the chest demonstrates clear lungs and normal heart size. No pneumothorax or pleural effusion.  Two views of the abdomen show no free intraperitoneal air. The bowel gas pattern is normal. No unexpected abdominal calcification is seen. No bony abnormality is identified.  IMPRESSION: Negative exam.   Electronically Signed   By: Inge Rise M.D.   On: 10/15/2014 07:28    Microbiology: No results found for this or any previous visit (from the past 240 hour(s)).   Labs: Basic Metabolic Panel:  Recent Labs Lab 10/15/14 0515 10/16/14 0500  NA 140 141  K 3.6 3.3*  CL 102 105  CO2 24 24  GLUCOSE  122* 104*  BUN 5* 5*  CREATININE 0.90 0.68  CALCIUM 9.6 9.2   Liver Function Tests:  Recent Labs Lab 10/15/14 0515 10/16/14 0500  AST 28 15  ALT 15 12*  ALKPHOS 80 58  BILITOT 0.2* 0.6  PROT 8.4* 6.8  ALBUMIN 4.5 3.6    Recent Labs Lab 10/15/14 0515  LIPASE 14*   No results for input(s): AMMONIA in the last 168 hours. CBC:  Recent Labs Lab 10/15/14 0515 10/16/14 0500  WBC 13.6* 10.3  NEUTROABS 10.5*  --   HGB 14.5 12.8  HCT 44.2 39.0  MCV 94.8 93.8  PLT 322 274   Cardiac Enzymes: No results for input(s): CKTOTAL, CKMB, CKMBINDEX, TROPONINI in the last 168 hours. BNP: BNP (last 3 results) No results for input(s): BNP in the last 8760 hours.  ProBNP (last 3 results) No results for input(s): PROBNP in the last 8760 hours.  CBG: No results for input(s): GLUCAP in the last 168 hours.     SignedKelvin Cellar  [facility] 10/17/2014, 9:49 AM

## 2014-12-11 ENCOUNTER — Emergency Department (HOSPITAL_COMMUNITY): Payer: Medicaid Other

## 2014-12-11 ENCOUNTER — Inpatient Hospital Stay (HOSPITAL_COMMUNITY)
Admission: EM | Admit: 2014-12-11 | Discharge: 2014-12-20 | DRG: 872 | Disposition: A | Discharge status: Home / Self Care | Payer: Medicaid Other | Attending: Internal Medicine | Admitting: Internal Medicine

## 2014-12-11 ENCOUNTER — Encounter (HOSPITAL_COMMUNITY): Payer: Self-pay | Admitting: *Deleted

## 2014-12-11 DIAGNOSIS — F419 Anxiety disorder, unspecified: Secondary | ICD-10-CM | POA: Diagnosis present

## 2014-12-11 DIAGNOSIS — F4310 Post-traumatic stress disorder, unspecified: Secondary | ICD-10-CM | POA: Diagnosis present

## 2014-12-11 DIAGNOSIS — F1193 Opioid use, unspecified with withdrawal: Secondary | ICD-10-CM | POA: Clinically undetermined

## 2014-12-11 DIAGNOSIS — Z79891 Long term (current) use of opiate analgesic: Secondary | ICD-10-CM

## 2014-12-11 DIAGNOSIS — R000 Tachycardia, unspecified: Secondary | ICD-10-CM | POA: Diagnosis present

## 2014-12-11 DIAGNOSIS — L309 Dermatitis, unspecified: Secondary | ICD-10-CM | POA: Diagnosis present

## 2014-12-11 DIAGNOSIS — R6510 Systemic inflammatory response syndrome (SIRS) of non-infectious origin without acute organ dysfunction: Principal | ICD-10-CM | POA: Diagnosis present

## 2014-12-11 DIAGNOSIS — I10 Essential (primary) hypertension: Secondary | ICD-10-CM | POA: Diagnosis present

## 2014-12-11 DIAGNOSIS — R109 Unspecified abdominal pain: Secondary | ICD-10-CM

## 2014-12-11 DIAGNOSIS — D72829 Elevated white blood cell count, unspecified: Secondary | ICD-10-CM | POA: Diagnosis present

## 2014-12-11 DIAGNOSIS — W010XXA Fall on same level from slipping, tripping and stumbling without subsequent striking against object, initial encounter: Secondary | ICD-10-CM | POA: Diagnosis present

## 2014-12-11 DIAGNOSIS — E785 Hyperlipidemia, unspecified: Secondary | ICD-10-CM | POA: Diagnosis present

## 2014-12-11 DIAGNOSIS — W19XXXA Unspecified fall, initial encounter: Secondary | ICD-10-CM | POA: Diagnosis present

## 2014-12-11 DIAGNOSIS — Z85828 Personal history of other malignant neoplasm of skin: Secondary | ICD-10-CM

## 2014-12-11 DIAGNOSIS — G8929 Other chronic pain: Secondary | ICD-10-CM | POA: Diagnosis present

## 2014-12-11 DIAGNOSIS — B029 Zoster without complications: Secondary | ICD-10-CM | POA: Diagnosis present

## 2014-12-11 DIAGNOSIS — S3210XA Unspecified fracture of sacrum, initial encounter for closed fracture: Secondary | ICD-10-CM | POA: Diagnosis present

## 2014-12-11 DIAGNOSIS — E872 Acidosis, unspecified: Secondary | ICD-10-CM | POA: Diagnosis present

## 2014-12-11 DIAGNOSIS — M545 Low back pain, unspecified: Secondary | ICD-10-CM | POA: Diagnosis present

## 2014-12-11 DIAGNOSIS — B0089 Other herpesviral infection: Secondary | ICD-10-CM | POA: Diagnosis present

## 2014-12-11 DIAGNOSIS — A419 Sepsis, unspecified organism: Secondary | ICD-10-CM

## 2014-12-11 DIAGNOSIS — F1123 Opioid dependence with withdrawal: Secondary | ICD-10-CM | POA: Clinically undetermined

## 2014-12-11 DIAGNOSIS — I4711 Inappropriate sinus tachycardia, so stated: Secondary | ICD-10-CM | POA: Diagnosis present

## 2014-12-11 DIAGNOSIS — I959 Hypotension, unspecified: Secondary | ICD-10-CM | POA: Diagnosis not present

## 2014-12-11 DIAGNOSIS — F418 Other specified anxiety disorders: Secondary | ICD-10-CM | POA: Diagnosis present

## 2014-12-11 DIAGNOSIS — F32A Depression, unspecified: Secondary | ICD-10-CM | POA: Diagnosis present

## 2014-12-11 DIAGNOSIS — Z91018 Allergy to other foods: Secondary | ICD-10-CM

## 2014-12-11 DIAGNOSIS — R1110 Vomiting, unspecified: Secondary | ICD-10-CM

## 2014-12-11 DIAGNOSIS — R0600 Dyspnea, unspecified: Secondary | ICD-10-CM

## 2014-12-11 DIAGNOSIS — Z79899 Other long term (current) drug therapy: Secondary | ICD-10-CM

## 2014-12-11 DIAGNOSIS — R112 Nausea with vomiting, unspecified: Secondary | ICD-10-CM | POA: Diagnosis present

## 2014-12-11 DIAGNOSIS — F329 Major depressive disorder, single episode, unspecified: Secondary | ICD-10-CM | POA: Diagnosis present

## 2014-12-11 DIAGNOSIS — B009 Herpesviral infection, unspecified: Secondary | ICD-10-CM | POA: Diagnosis present

## 2014-12-11 LAB — I-STAT CG4 LACTIC ACID, ED: LACTIC ACID, VENOUS: 4.27 mmol/L — AB (ref 0.5–2.0)

## 2014-12-11 LAB — I-STAT BETA HCG BLOOD, ED (MC, WL, AP ONLY): I-stat hCG, quantitative: <5 m[IU]/mL (ref <5)

## 2014-12-11 MED ORDER — HYDROMORPHONE HCL 1 MG/ML IJ SOLN
1.0000 mg | Freq: Once | INTRAMUSCULAR | Status: AC
  Administered 2014-12-12: 1 mg via INTRAVENOUS
  Filled 2014-12-11: qty 1

## 2014-12-11 MED ORDER — PROMETHAZINE HCL 25 MG/ML IJ SOLN
25.0000 mg | Freq: Once | INTRAMUSCULAR | Status: AC
  Administered 2014-12-12: 25 mg via INTRAVENOUS
  Filled 2014-12-11: qty 1

## 2014-12-11 MED ORDER — HYDROMORPHONE HCL 1 MG/ML IJ SOLN
1.0000 mg | Freq: Once | INTRAMUSCULAR | Status: AC
  Administered 2014-12-11: 1 mg via INTRAVENOUS
  Filled 2014-12-11: qty 1

## 2014-12-11 MED ORDER — PIPERACILLIN-TAZOBACTAM 3.375 G IVPB 30 MIN
3.3750 g | Freq: Once | INTRAVENOUS | Status: AC
  Administered 2014-12-12: 3.375 g via INTRAVENOUS
  Filled 2014-12-11: qty 50

## 2014-12-11 MED ORDER — SODIUM CHLORIDE 0.9 % IV SOLN
1000.0000 mL | Freq: Once | INTRAVENOUS | Status: AC
  Administered 2014-12-11: 1000 mL via INTRAVENOUS

## 2014-12-11 MED ORDER — DEXTROSE 5 % IV SOLN
10.0000 mg/kg | Freq: Once | INTRAVENOUS | Status: AC
  Administered 2014-12-12: 750 mg via INTRAVENOUS
  Filled 2014-12-11: qty 15

## 2014-12-11 MED ORDER — KETOROLAC TROMETHAMINE 30 MG/ML IJ SOLN
30.0000 mg | Freq: Once | INTRAMUSCULAR | Status: AC
  Administered 2014-12-11: 30 mg via INTRAVENOUS
  Filled 2014-12-11: qty 1

## 2014-12-11 MED ORDER — HYDROMORPHONE HCL 1 MG/ML IJ SOLN
2.0000 mg | Freq: Once | INTRAMUSCULAR | Status: AC
  Administered 2014-12-11: 2 mg via INTRAVENOUS
  Filled 2014-12-11 (×2): qty 2

## 2014-12-11 MED ORDER — VANCOMYCIN HCL IN DEXTROSE 1-5 GM/200ML-% IV SOLN
1000.0000 mg | Freq: Once | INTRAVENOUS | Status: AC
  Administered 2014-12-12: 1000 mg via INTRAVENOUS
  Filled 2014-12-11: qty 200

## 2014-12-11 MED ORDER — SODIUM CHLORIDE 0.9 % IV SOLN
1000.0000 mL | INTRAVENOUS | Status: DC
  Administered 2014-12-11: 1000 mL via INTRAVENOUS

## 2014-12-11 MED ORDER — SODIUM CHLORIDE 0.9 % IV SOLN: 1000.0000 mL | Freq: Once | INTRAVENOUS | Status: DC

## 2014-12-11 MED ORDER — ONDANSETRON HCL 4 MG/2ML IJ SOLN
4.0000 mg | Freq: Once | INTRAMUSCULAR | Status: AC
  Administered 2014-12-11: 4 mg via INTRAVENOUS
  Filled 2014-12-11: qty 2

## 2014-12-11 MED ORDER — DIAZEPAM 5 MG/ML IJ SOLN
5.0000 mg | Freq: Once | INTRAMUSCULAR | Status: AC
  Administered 2014-12-11: 5 mg via INTRAVENOUS
  Filled 2014-12-11: qty 2

## 2014-12-11 NOTE — ED Notes (Signed)
Emesis X1. Pt HR in 140's. EDP notified

## 2014-12-11 NOTE — ED Notes (Signed)
Per EMS Pt tripped in house and fell face first onto tgile floor. Pt denies any LOC but has pain to RT hip. Pt arrived positioned on ABD for pain to Posterior RT hip

## 2014-12-11 NOTE — ED Notes (Signed)
Pt back from CT

## 2014-12-11 NOTE — ED Notes (Signed)
Pt to CT

## 2014-12-11 NOTE — ED Notes (Signed)
CT called, informed that pt is not ready for transport

## 2014-12-12 ENCOUNTER — Emergency Department (HOSPITAL_COMMUNITY): Payer: Medicaid Other

## 2014-12-12 ENCOUNTER — Inpatient Hospital Stay (HOSPITAL_COMMUNITY): Payer: Medicaid Other

## 2014-12-12 DIAGNOSIS — L309 Dermatitis, unspecified: Secondary | ICD-10-CM | POA: Diagnosis present

## 2014-12-12 DIAGNOSIS — S3210XA Unspecified fracture of sacrum, initial encounter for closed fracture: Secondary | ICD-10-CM | POA: Diagnosis present

## 2014-12-12 DIAGNOSIS — W010XXA Fall on same level from slipping, tripping and stumbling without subsequent striking against object, initial encounter: Secondary | ICD-10-CM | POA: Diagnosis present

## 2014-12-12 DIAGNOSIS — E872 Acidosis: Secondary | ICD-10-CM | POA: Diagnosis present

## 2014-12-12 DIAGNOSIS — Z85828 Personal history of other malignant neoplasm of skin: Secondary | ICD-10-CM | POA: Diagnosis not present

## 2014-12-12 DIAGNOSIS — Z79899 Other long term (current) drug therapy: Secondary | ICD-10-CM | POA: Diagnosis not present

## 2014-12-12 DIAGNOSIS — D72829 Elevated white blood cell count, unspecified: Secondary | ICD-10-CM | POA: Diagnosis present

## 2014-12-12 DIAGNOSIS — I10 Essential (primary) hypertension: Secondary | ICD-10-CM | POA: Diagnosis present

## 2014-12-12 DIAGNOSIS — R112 Nausea with vomiting, unspecified: Secondary | ICD-10-CM | POA: Insufficient documentation

## 2014-12-12 DIAGNOSIS — Z79891 Long term (current) use of opiate analgesic: Secondary | ICD-10-CM | POA: Diagnosis not present

## 2014-12-12 DIAGNOSIS — M545 Low back pain: Secondary | ICD-10-CM | POA: Diagnosis present

## 2014-12-12 DIAGNOSIS — F329 Major depressive disorder, single episode, unspecified: Secondary | ICD-10-CM | POA: Diagnosis present

## 2014-12-12 DIAGNOSIS — F4310 Post-traumatic stress disorder, unspecified: Secondary | ICD-10-CM | POA: Diagnosis present

## 2014-12-12 DIAGNOSIS — Z91018 Allergy to other foods: Secondary | ICD-10-CM | POA: Diagnosis not present

## 2014-12-12 DIAGNOSIS — F419 Anxiety disorder, unspecified: Secondary | ICD-10-CM | POA: Diagnosis present

## 2014-12-12 DIAGNOSIS — W19XXXA Unspecified fall, initial encounter: Secondary | ICD-10-CM | POA: Diagnosis present

## 2014-12-12 DIAGNOSIS — G8929 Other chronic pain: Secondary | ICD-10-CM | POA: Diagnosis present

## 2014-12-12 DIAGNOSIS — I959 Hypotension, unspecified: Secondary | ICD-10-CM | POA: Diagnosis not present

## 2014-12-12 DIAGNOSIS — E785 Hyperlipidemia, unspecified: Secondary | ICD-10-CM | POA: Diagnosis present

## 2014-12-12 DIAGNOSIS — R6510 Systemic inflammatory response syndrome (SIRS) of non-infectious origin without acute organ dysfunction: Secondary | ICD-10-CM | POA: Diagnosis present

## 2014-12-12 DIAGNOSIS — B009 Herpesviral infection, unspecified: Secondary | ICD-10-CM | POA: Diagnosis present

## 2014-12-12 DIAGNOSIS — B029 Zoster without complications: Secondary | ICD-10-CM | POA: Diagnosis present

## 2014-12-12 DIAGNOSIS — A419 Sepsis, unspecified organism: Secondary | ICD-10-CM

## 2014-12-12 LAB — CBC
HCT: 35.5 % — ABNORMAL LOW (ref 36.0–46.0)
Hemoglobin: 12.1 g/dL (ref 12.0–15.0)
MCH: 30.9 pg (ref 26.0–34.0)
MCHC: 34.1 g/dL (ref 30.0–36.0)
MCV: 90.6 fL (ref 78.0–100.0)
PLATELETS: 233 10*3/uL (ref 150–400)
RBC: 3.92 MIL/uL (ref 3.87–5.11)
RDW: 13.2 % (ref 11.5–15.5)
WBC: 14.7 10*3/uL — ABNORMAL HIGH (ref 4.0–10.5)

## 2014-12-12 LAB — RAPID URINE DRUG SCREEN, HOSP PERFORMED
Amphetamines: NOT DETECTED
Barbiturates: NOT DETECTED
Benzodiazepines: POSITIVE — AB
Cocaine: NOT DETECTED
OPIATES: POSITIVE — AB
Tetrahydrocannabinol: NOT DETECTED

## 2014-12-12 LAB — CBC WITH DIFFERENTIAL/PLATELET
BASOS ABS: 0.2 10*3/uL — AB (ref 0.0–0.1)
Basophils Relative: 1 % (ref 0–1)
EOS PCT: 0 % (ref 0–5)
Eosinophils Absolute: 0 10*3/uL (ref 0.0–0.7)
HEMATOCRIT: 39.2 % (ref 36.0–46.0)
Hemoglobin: 13.5 g/dL (ref 12.0–15.0)
LYMPHS ABS: 1.9 10*3/uL (ref 0.7–4.0)
Lymphocytes Relative: 11 % — ABNORMAL LOW (ref 12–46)
MCH: 31 pg (ref 26.0–34.0)
MCHC: 34.4 g/dL (ref 30.0–36.0)
MCV: 90.1 fL (ref 78.0–100.0)
MONOS PCT: 8 % (ref 3–12)
Monocytes Absolute: 1.4 10*3/uL — ABNORMAL HIGH (ref 0.1–1.0)
NEUTROS PCT: 80 % — AB (ref 43–77)
Neutro Abs: 13.5 10*3/uL — ABNORMAL HIGH (ref 1.7–7.7)
PLATELETS: 269 10*3/uL (ref 150–400)
RBC: 4.35 MIL/uL (ref 3.87–5.11)
RDW: 12.9 % (ref 11.5–15.5)
WBC: 17 10*3/uL — AB (ref 4.0–10.5)

## 2014-12-12 LAB — BASIC METABOLIC PANEL
Anion gap: 13 (ref 5–15)
Anion gap: 18 — ABNORMAL HIGH (ref 5–15)
BUN: 10 mg/dL (ref 6–20)
CALCIUM: 7.9 mg/dL — AB (ref 8.9–10.3)
CHLORIDE: 104 mmol/L (ref 101–111)
CO2: 16 mmol/L — ABNORMAL LOW (ref 22–32)
CO2: 18 mmol/L — AB (ref 22–32)
CREATININE: 0.63 mg/dL (ref 0.44–1.00)
Calcium: 8.9 mg/dL (ref 8.9–10.3)
Chloride: 104 mmol/L (ref 101–111)
Creatinine, Ser: 0.73 mg/dL (ref 0.44–1.00)
GFR calc Af Amer: >60 mL/min (ref >60)
GFR calc Af Amer: >60 mL/min (ref >60)
GFR calc non Af Amer: >60 mL/min (ref >60)
GFR calc non Af Amer: >60 mL/min (ref >60)
GLUCOSE: 129 mg/dL — AB (ref 65–99)
Glucose, Bld: 102 mg/dL — ABNORMAL HIGH (ref 65–99)
Potassium: 3.9 mmol/L (ref 3.5–5.1)
Potassium: 4.1 mmol/L (ref 3.5–5.1)
Sodium: 135 mmol/L (ref 135–145)
Sodium: 138 mmol/L (ref 135–145)

## 2014-12-12 LAB — URINALYSIS, ROUTINE W REFLEX MICROSCOPIC
BILIRUBIN URINE: NEGATIVE
Glucose, UA: NEGATIVE mg/dL
HGB URINE DIPSTICK: NEGATIVE
KETONES UR: NEGATIVE mg/dL
Leukocytes, UA: NEGATIVE
NITRITE: NEGATIVE
PROTEIN: NEGATIVE mg/dL
SPECIFIC GRAVITY, URINE: 1.017 (ref 1.005–1.030)
UROBILINOGEN UA: 0.2 mg/dL (ref 0.0–1.0)
pH: 5 (ref 5.0–8.0)

## 2014-12-12 LAB — HEPATIC FUNCTION PANEL
ALBUMIN: 4.2 g/dL (ref 3.5–5.0)
ALT: 27 U/L (ref 14–54)
AST: 46 U/L — ABNORMAL HIGH (ref 15–41)
Alkaline Phosphatase: 93 U/L (ref 38–126)
BILIRUBIN INDIRECT: 0.6 mg/dL (ref 0.3–0.9)
Bilirubin, Direct: 0.1 mg/dL (ref 0.1–0.5)
TOTAL PROTEIN: 7.8 g/dL (ref 6.5–8.1)
Total Bilirubin: 0.7 mg/dL (ref 0.3–1.2)

## 2014-12-12 LAB — PROTIME-INR
INR: 1.14 (ref 0.00–1.49)
PROTHROMBIN TIME: 14.8 s (ref 11.6–15.2)

## 2014-12-12 LAB — TSH: TSH: 1.288 u[IU]/mL (ref 0.350–4.500)

## 2014-12-12 LAB — LACTIC ACID, PLASMA: Lactic Acid, Venous: 3.4 mmol/L (ref 0.5–2.0)

## 2014-12-12 LAB — MRSA PCR SCREENING: MRSA BY PCR: NEGATIVE

## 2014-12-12 LAB — LIPASE, BLOOD: LIPASE: 12 U/L — AB (ref 22–51)

## 2014-12-12 LAB — PROCALCITONIN: PROCALCITONIN: 0.17 ng/mL

## 2014-12-12 LAB — APTT: aPTT: 29 seconds (ref 24–37)

## 2014-12-12 MED ORDER — TIZANIDINE HCL 4 MG PO TABS
4.0000 mg | ORAL_TABLET | Freq: Four times a day (QID) | ORAL | Status: DC | PRN
  Filled 2014-12-12: qty 1

## 2014-12-12 MED ORDER — ALUM & MAG HYDROXIDE-SIMETH 200-200-20 MG/5ML PO SUSP: 30.0000 mL | Freq: Four times a day (QID) | ORAL | Status: DC | PRN

## 2014-12-12 MED ORDER — SODIUM CHLORIDE 0.9 % IV BOLUS (SEPSIS)
500.0000 mL | INTRAVENOUS | Status: AC
  Administered 2014-12-12: 500 mL via INTRAVENOUS

## 2014-12-12 MED ORDER — PIPERACILLIN-TAZOBACTAM 3.375 G IVPB
3.3750 g | Freq: Three times a day (TID) | INTRAVENOUS | Status: DC
  Administered 2014-12-12 – 2014-12-14 (×6): 3.375 g via INTRAVENOUS
  Filled 2014-12-12 (×9): qty 50

## 2014-12-12 MED ORDER — SODIUM CHLORIDE 0.9 % IV SOLN
INTRAVENOUS | Status: DC
  Administered 2014-12-12: 04:00:00 via INTRAVENOUS

## 2014-12-12 MED ORDER — VANCOMYCIN HCL IN DEXTROSE 750-5 MG/150ML-% IV SOLN
750.0000 mg | Freq: Three times a day (TID) | INTRAVENOUS | Status: DC
  Administered 2014-12-12 – 2014-12-14 (×7): 750 mg via INTRAVENOUS
  Filled 2014-12-12 (×9): qty 150

## 2014-12-12 MED ORDER — ACYCLOVIR 200 MG PO CAPS
800.0000 mg | ORAL_CAPSULE | Freq: Every day | ORAL | Status: DC
  Filled 2014-12-12 (×6): qty 4

## 2014-12-12 MED ORDER — LORAZEPAM 2 MG/ML IJ SOLN
1.0000 mg | Freq: Four times a day (QID) | INTRAMUSCULAR | Status: AC
  Administered 2014-12-12 – 2014-12-14 (×9): 1 mg via INTRAVENOUS
  Filled 2014-12-12 (×9): qty 1

## 2014-12-12 MED ORDER — ACETAMINOPHEN 325 MG PO TABS
650.0000 mg | ORAL_TABLET | Freq: Four times a day (QID) | ORAL | Status: DC | PRN
  Administered 2014-12-17 – 2014-12-18 (×3): 650 mg via ORAL
  Filled 2014-12-12 (×3): qty 2

## 2014-12-12 MED ORDER — LORAZEPAM 2 MG/ML IJ SOLN
1.0000 mg | Freq: Once | INTRAMUSCULAR | Status: AC
  Administered 2014-12-12: 1 mg via INTRAVENOUS
  Filled 2014-12-12: qty 1

## 2014-12-12 MED ORDER — SODIUM CHLORIDE 0.9 % IV BOLUS (SEPSIS)
1000.0000 mL | INTRAVENOUS | Status: AC
  Administered 2014-12-12 (×2): 1000 mL via INTRAVENOUS

## 2014-12-12 MED ORDER — HYDROMORPHONE HCL 1 MG/ML IJ SOLN
1.0000 mg | INTRAMUSCULAR | Status: DC | PRN
  Administered 2014-12-12 (×2): 2 mg via INTRAVENOUS
  Filled 2014-12-12 (×2): qty 2

## 2014-12-12 MED ORDER — DIAZEPAM 5 MG PO TABS: 5.0000 mg | ORAL_TABLET | Freq: Four times a day (QID) | ORAL | Status: DC

## 2014-12-12 MED ORDER — ONDANSETRON HCL 4 MG/2ML IJ SOLN
4.0000 mg | Freq: Four times a day (QID) | INTRAMUSCULAR | Status: DC | PRN
  Administered 2014-12-12 – 2014-12-16 (×3): 4 mg via INTRAVENOUS
  Filled 2014-12-12 (×3): qty 2

## 2014-12-12 MED ORDER — ONDANSETRON HCL 4 MG PO TABS
4.0000 mg | ORAL_TABLET | Freq: Four times a day (QID) | ORAL | Status: DC | PRN
  Administered 2014-12-20: 4 mg via ORAL
  Filled 2014-12-12: qty 1

## 2014-12-12 MED ORDER — AMLODIPINE BESYLATE 10 MG PO TABS: 10.0000 mg | ORAL_TABLET | Freq: Every day | ORAL | Status: DC

## 2014-12-12 MED ORDER — METOPROLOL TARTRATE 1 MG/ML IV SOLN
5.0000 mg | Freq: Once | INTRAVENOUS | Status: AC
  Administered 2014-12-12: 5 mg via INTRAVENOUS
  Filled 2014-12-12: qty 5

## 2014-12-12 MED ORDER — HYDROMORPHONE HCL 1 MG/ML IJ SOLN
2.0000 mg | INTRAMUSCULAR | Status: DC | PRN
  Administered 2014-12-12 – 2014-12-13 (×5): 2 mg via INTRAVENOUS
  Administered 2014-12-13: 1 mg via INTRAVENOUS
  Administered 2014-12-13 – 2014-12-15 (×11): 2 mg via INTRAVENOUS
  Filled 2014-12-12 (×18): qty 2

## 2014-12-12 MED ORDER — DEXTROSE 5 % IV SOLN
10.0000 mg/kg | Freq: Three times a day (TID) | INTRAVENOUS | Status: DC
  Administered 2014-12-12 (×2): 795 mg via INTRAVENOUS
  Filled 2014-12-12 (×4): qty 15.9

## 2014-12-12 MED ORDER — ACETAMINOPHEN 650 MG RE SUPP: 650.0000 mg | Freq: Four times a day (QID) | RECTAL | Status: DC | PRN

## 2014-12-12 MED ORDER — OXYCODONE HCL 5 MG PO TABS
5.0000 mg | ORAL_TABLET | ORAL | Status: DC | PRN
  Administered 2014-12-12: 5 mg via ORAL
  Filled 2014-12-12: qty 1

## 2014-12-12 MED ORDER — METOPROLOL TARTRATE 1 MG/ML IV SOLN
5.0000 mg | Freq: Four times a day (QID) | INTRAVENOUS | Status: DC
  Administered 2014-12-12 – 2014-12-13 (×5): 5 mg via INTRAVENOUS
  Filled 2014-12-12 (×9): qty 5

## 2014-12-12 MED ORDER — METOCLOPRAMIDE HCL 5 MG/ML IJ SOLN
5.0000 mg | Freq: Three times a day (TID) | INTRAMUSCULAR | Status: AC
  Administered 2014-12-12 – 2014-12-14 (×8): 5 mg via INTRAVENOUS
  Filled 2014-12-12: qty 1
  Filled 2014-12-12: qty 2
  Filled 2014-12-12: qty 1
  Filled 2014-12-12 (×6): qty 2
  Filled 2014-12-12 (×4): qty 1

## 2014-12-12 NOTE — Progress Notes (Signed)
ANTIBIOTIC CONSULT NOTE - INITIAL  Pharmacy Consult for Vancomycin/Zosyn  Indication: rule out sepsis  Allergies  Allergen Reactions  . Lactose Intolerance (Gi) Nausea Only    Patient Measurements: Height: 5\' 5"  (165.1 cm) Weight: 165 lb (74.844 kg) IBW/kg (Calculated) : 57  Vital Signs: Temp: 99.7 F (37.6 C) (08/20 2235) Temp Source: Oral (08/20 2235) BP: 152/109 mmHg (08/20 2330) Pulse Rate: 123 (08/20 2330)  Labs:  Recent Labs  12/11/14 2334  CREATININE 0.73   Estimated Creatinine Clearance: 91.8 mL/min (by C-G formula based on Cr of 0.73).  Medical History: Past Medical History  Diagnosis Date  . PTSD (post-traumatic stress disorder)   . Bulging lumbar disc   . Depression   . Hypertension   . Hyperlipemia   . Headache     "weekly" (10/15/2014)  . DDD (degenerative disc disease), cervical   . Arthritis     "joints ache all over" (10/15/2014)  . Chronic lower back pain   . Anxiety   . Skin cancer     "had them cut off my arms; don't know what kind"   Assessment: 44 y/o F to start broad spectrum anti-biotics for r/o sepsis, pt tachycardic, Tmax 99.7, renal function good, other labs reviewed, CBC pending.   Goal of Therapy:  Vancomycin trough level 15-20 mcg/ml  Plan:  -Vancomycin 750 mg IV q8h -Zosyn 3.375G IV q8h to be infused over 4 hours -Trend WBC, temp, renal function  -Drug levels as indicated  -F/U infectious work-up  Narda Bonds 12/12/2014,1:06 AM

## 2014-12-12 NOTE — ED Notes (Signed)
Attempted report X1

## 2014-12-12 NOTE — ED Provider Notes (Signed)
CSN: [id number]     Arrival date & time 12/11/14  1818 History   First MD Initiated Contact with Patient 12/11/14 1825     Chief Complaint  Patient presents with  . Fall  . Hip Pain     (Consider location/radiation/quality/duration/timing/severity/associated sxs/prior Treatment) HPI Patient presents to the emergency department with a fall that occurred prior to arrival.  The patient states that she fell in her home and she fell, landing on her right buttocks and back.  Patient states that she did not hit her head.  She only has pain in her right lower back and pelvic region.  Patient states that she does not have any pain anywhere else.  The patient states she does have shingles at this time.  Patient denies incontinence, nausea, vomiting, diarrhea, weakness, dizziness, headache, blurred vision, neck pain, fever, cough, shortness of breath, chest pain, or syncope.  Event and palpation make the pain worse Past Medical History  Diagnosis Date  . PTSD (post-traumatic stress disorder)   . Bulging lumbar disc   . Depression   . Hypertension   . Hyperlipemia   . Headache     "weekly" (10/15/2014)  . DDD (degenerative disc disease), cervical   . Arthritis     "joints ache all over" (10/15/2014)  . Chronic lower back pain   . Anxiety   . Skin cancer     "had them cut off my arms; don't know what kind"   Past Surgical History  Procedure Laterality Date  . Ablation on endometriosis  2008  . Hemorrhoid surgery  ~ 2002   History reviewed. No pertinent family history. Social History  Substance Use Topics  . Smoking status: Never Smoker   . Smokeless tobacco: Never Used  . Alcohol Use: Yes     Comment: 10/15/2014 "I've drank before; nothing regular; don't drink now cause of RX I'm on"   OB History    No data available     Review of Systems  All other systems negative except as documented in the HPI. All pertinent positives and negatives as reviewed in the HPI.  Allergies  Lactose  intolerance (gi)  Home Medications   Prior to Admission medications   Medication Sig Start Date End Date Taking? Authorizing Provider  acyclovir (ZOVIRAX) 200 MG capsule Take 800 mg by mouth 5 (five) times daily.   Yes Historical Provider, MD  amLODipine (NORVASC) 10 MG tablet Take 1 tablet (10 mg total) by mouth daily. 10/17/14  Yes Kelvin Cellar, MD  diazepam (VALIUM) 5 MG tablet Take 5 mg by mouth 4 (four) times daily.    Yes Historical Provider, MD  HYDROcodone-acetaminophen (NORCO) 10-325 MG per tablet Take 1 tablet by mouth every 6 (six) hours as needed for moderate pain.   Yes Historical Provider, MD  tiZANidine (ZANAFLEX) 4 MG tablet Take 4 mg by mouth every 6 (six) hours as needed for muscle spasms.   Yes Historical Provider, MD   BP 152/109 mmHg  Pulse 123  Temp(Src) 99.7 F (37.6 C) (Oral)  Resp 22  Ht 5\' 5"  (1.651 m)  Wt 165 lb (74.844 kg)  BMI 27.46 kg/m2  SpO2 100%  LMP 10/23/2014 Physical Exam  Constitutional: She is oriented to person, place, and time. She appears well-developed and well-nourished. No distress.  HENT:  Head: Normocephalic and atraumatic.  Mouth/Throat: Oropharynx is clear and moist.  Eyes: Pupils are equal, round, and reactive to light.  Neck: Normal range of motion. Neck supple.  Cardiovascular: Normal  rate, regular rhythm and normal heart sounds.   Pulmonary/Chest: Effort normal and breath sounds normal. No respiratory distress. She has no wheezes. She has no rales.  Musculoskeletal:       Back:  Neurological: She is alert and oriented to person, place, and time. She exhibits normal muscle tone. Coordination normal.  Skin: Skin is warm and dry. Rash noted. No erythema.  Nursing note and vitals reviewed.   ED Course  Procedures (including critical care time) Labs Review Labs Reviewed  BASIC METABOLIC PANEL - Abnormal; Notable for the following:    CO2 16 (*)    Glucose, Bld 102 (*)    Anion gap 18 (*)    All other components within  normal limits  HEPATIC FUNCTION PANEL - Abnormal; Notable for the following:    AST 46 (*)    All other components within normal limits  LIPASE, BLOOD - Abnormal; Notable for the following:    Lipase 12 (*)    All other components within normal limits  CBC WITH DIFFERENTIAL/PLATELET - Abnormal; Notable for the following:    WBC 17.0 (*)    All other components within normal limits  I-STAT CG4 LACTIC ACID, ED - Abnormal; Notable for the following:    Lactic Acid, Venous 4.27 (*)    All other components within normal limits  CULTURE, BLOOD (ROUTINE X 2)  CULTURE, BLOOD (ROUTINE X 2)  CBC WITH DIFFERENTIAL/PLATELET  URINALYSIS, ROUTINE W REFLEX MICROSCOPIC (NOT AT [facility])  PROTIME-INR  I-STAT BETA HCG BLOOD, ED ([facility], [facility], [facility] ONLY)    Imaging Review Ct Lumbar Spine Wo Contrast  12/11/2014   CLINICAL DATA:  Tripped and fell onto tile floor at home today. No loss of consciousness. RIGHT hip pain. History of lumbar disc disease, arthritis.  EXAM: CT LUMBAR SPINE WITHOUT CONTRAST  TECHNIQUE: Multidetector CT imaging of the lumbar spine was performed without intravenous contrast administration. Multiplanar CT image reconstructions were also generated.  COMPARISON:  CT abdomen and pelvis Sep 01, 2014  FINDINGS: Lumbar vertebral bodies are intact and aligned with maintenance of the lumbar lordosis. Status post apparent LEFT L4-5 undersurface laminectomy. Mild L5-S1 disc height loss, stable from prior imaging. Scattered chronic Schmorl's nodes were present previously. No destructive bony lesions. Lumbar lordosis maintained.  Comminuted nondisplaced RIGHT sacral fracture, zone 1. No destructive bony lesions. Sacroiliac joints are intact, symmetric. Included soft tissues are normal.  IMPRESSION: Acute nondisplaced RIGHT sacral zone 1 fracture.  No acute lumbar spine fracture or malalignment.   Electronically Signed   By: Elon Alas M.D.   On: 12/11/2014 22:20   I have personally reviewed and evaluated  these images and lab results as part of my medical decision-making.   EKG Interpretation None      The patient has had changes in her condition here in the emergency department, she did become tachycardic.  She been vomiting.  She has had significant pain has been unrelieved with pain medications.  No documented fevers.  She has been mentating appropriately.  The patient has been admitted for similar issues in the past    Dalia Heading, PA-C 12/12/14 0206  Charlesetta Shanks, MD 12/16/14 2217

## 2014-12-12 NOTE — H&P (Addendum)
[facility] Admission History and Physical       BARBARELLA [name] [id number] DOB: 01/22/1971 DOA: 12/11/2014  Referring physician: EDP PCP: Leonard Downing, MD  Specialists:   Chief Complaint: Increased Pain Right Hip and Lower Back after Falling  HPI: [name] is a 44 y.o. female with a history of HTN, Hyperlipidemia, DD, Chronic Pain, and Anxiety who was brought to the ED after she tripped and fell and landed on her lower back.   She was found to have a non-displaced Right Sacral fracture.   While in the ED she developed fever, and tachycardia, and nausea and vomiting while in the ED and was found to have a lactic Acidosis of 4.27.  A Sepsis Workup was initiated and she was administered IV Fluids and Given empiric Antibiotics of IV  Vancomycin and Zosyn and referred for admission.   She was also given 1 dose of IV Acyclovir for her Shingles outbreak that started 3 days ago    Review of Systems: Constitutional: No Weight Loss, No Weight Gain, Night Sweats, Fevers, Chills, Dizziness, Light Headedness, Fatigue, or Generalized Weakness HEENT: No Headaches, Difficulty Swallowing,Tooth/Dental Problems,Sore Throat,  No Sneezing, Rhinitis, Ear Ache, Nasal Congestion, or Post Nasal Drip,  Cardio-vascular:  No Chest pain, Orthopnea, PND, Edema in Lower Extremities, Anasarca, Dizziness, Palpitations  Resp: No Dyspnea, No DOE, No Productive Cough, No Non-Productive Cough, No Hemoptysis, No Wheezing.    GI: No Heartburn, Indigestion, Abdominal Pain, Nausea, Vomiting, Diarrhea, Constipation, Hematemesis, Hematochezia, Melena, Change in Bowel Habits,  Loss of Appetite  GU: No Dysuria, No Change in Color of Urine, No Urgency or Urinary Frequency, No Flank pain.  Musculoskeletal: +Low Back and Right Hip Pain, with  Decreased Range of Motion of RLE.  Neurologic: No Syncope, No Seizures, Muscle Weakness, Paresthesia, Vision Disturbance or Loss, No Diplopia, No Vertigo, No Difficulty  Walking,  Skin: No Rash or Lesions. Psych: No Change in Mood or Affect, No Depression or Anxiety, No Memory loss, No Confusion, or Hallucinations   Past Medical History  Diagnosis Date  . PTSD (post-traumatic stress disorder)   . Bulging lumbar disc   . Depression   . Hypertension   . Hyperlipemia   . Headache     "weekly" (10/15/2014)  . DDD (degenerative disc disease), cervical   . Arthritis     "joints ache all over" (10/15/2014)  . Chronic lower back pain   . Anxiety   . Skin cancer     "had them cut off my arms; don't know what kind"     Past Surgical History  Procedure Laterality Date  . Ablation on endometriosis  2008  . Hemorrhoid surgery  ~ 2002      Prior to Admission medications   Medication Sig Start Date End Date Taking? Authorizing Provider  acyclovir (ZOVIRAX) 200 MG capsule Take 800 mg by mouth 5 (five) times daily.   Yes Historical Provider, MD  amLODipine (NORVASC) 10 MG tablet Take 1 tablet (10 mg total) by mouth daily. 10/17/14  Yes Kelvin Cellar, MD  diazepam (VALIUM) 5 MG tablet Take 5 mg by mouth 4 (four) times daily.    Yes Historical Provider, MD  HYDROcodone-acetaminophen (NORCO) 10-325 MG per tablet Take 1 tablet by mouth every 6 (six) hours as needed for moderate pain.   Yes Historical Provider, MD  tiZANidine (ZANAFLEX) 4 MG tablet Take 4 mg by mouth every 6 (six) hours as needed for muscle spasms.   Yes Historical Provider, MD  Allergies  Allergen Reactions  . Lactose Intolerance (Gi) Nausea Only    Social History:  reports that she has never smoked. She has never used smokeless tobacco. She reports that she drinks alcohol. She reports that she does not use illicit drugs.    History reviewed. No pertinent family history.     Physical Exam:  GEN: Anxious Well Nourished and Well Developed  44 y.o. Caucasian female examined and in no acute distress; cooperative with exam Filed Vitals:   12/11/14 2315 12/11/14 2330 12/12/14 0200  12/12/14 0230  BP: 162/111 152/109 159/94 174/93  Pulse: 126 123 134 136  Temp:      TempSrc:      Resp: 12 22 18 18   Height:      Weight:      SpO2: 99% 100% 100% 100%   Blood pressure 174/93, pulse 136, temperature 99.7 F (37.6 C), temperature source Oral, resp. rate 18, height 5\' 5"  (1.651 m), weight 74.844 kg (165 lb), last menstrual period 10/23/2014, SpO2 100 %. PSYCH: She is alert and oriented x4;  Anxious does not appear depressed; affect is normal HEENT: Normocephalic and Atraumatic, Mucous membranes pink; PERRLA; EOM intact; Fundi:  Benign;  No scleral icterus, Nares: Patent, Oropharynx: Clear, Edentulous,    Neck:  FROM, No Cervical Lymphadenopathy nor Thyromegaly or Carotid Bruit; No JVD; Breasts:: Not examined CHEST WALL: No tenderness CHEST: Normal respiration, clear to auscultation bilaterally HEART:  Tachycardic and Regular rhythm; no murmurs rubs or gallops BACK: No kyphosis or scoliosis; No CVA tenderness ABDOMEN: Positive Bowel Sounds, Soft Non-Tender, No Rebound or Guarding; No Masses, No Organomegaly. Rectal Exam: Not done EXTREMITIES: No Cyanosis, Clubbing, or Edema; No Ulcerations. Genitalia: not examined PULSES: 2+ and symmetric SKIN: Normal hydration no rash or ulceration CNS:  Alert and Oriented x 4, No Focal Deficits,   RLE:  Neurovascularly Intact.    Vascular: pulses palpable throughout    Labs on Admission:  Basic Metabolic Panel:  Recent Labs Lab 12/11/14 2334  NA 138  K 3.9  CL 104  CO2 16*  GLUCOSE 102*  BUN 10  CREATININE 0.73  CALCIUM 8.9   Liver Function Tests:  Recent Labs Lab 12/11/14 2334  AST 46*  ALT 27  ALKPHOS 93  BILITOT 0.7  PROT 7.8  ALBUMIN 4.2    Recent Labs Lab 12/11/14 2334  LIPASE 12*   No results for input(s): AMMONIA in the last 168 hours. CBC:  Recent Labs Lab 12/12/14 0035  WBC 17.0*  NEUTROABS 13.5*  HGB 13.5  HCT 39.2  MCV 90.1  PLT 269   Cardiac Enzymes: No results for input(s):  CKTOTAL, CKMB, CKMBINDEX, TROPONINI in the last 168 hours.  BNP (last 3 results) No results for input(s): BNP in the last 8760 hours.  ProBNP (last 3 results) No results for input(s): PROBNP in the last 8760 hours.  CBG: No results for input(s): GLUCAP in the last 168 hours.  Radiological Exams on Admission: Ct Lumbar Spine Wo Contrast  12/11/2014   CLINICAL DATA:  Tripped and fell onto tile floor at home today. No loss of consciousness. RIGHT hip pain. History of lumbar disc disease, arthritis.  EXAM: CT LUMBAR SPINE WITHOUT CONTRAST  TECHNIQUE: Multidetector CT imaging of the lumbar spine was performed without intravenous contrast administration. Multiplanar CT image reconstructions were also generated.  COMPARISON:  CT abdomen and pelvis Sep 01, 2014  FINDINGS: Lumbar vertebral bodies are intact and aligned with maintenance of the lumbar lordosis. Status post apparent  LEFT L4-5 undersurface laminectomy. Mild L5-S1 disc height loss, stable from prior imaging. Scattered chronic Schmorl's nodes were present previously. No destructive bony lesions. Lumbar lordosis maintained.  Comminuted nondisplaced RIGHT sacral fracture, zone 1. No destructive bony lesions. Sacroiliac joints are intact, symmetric. Included soft tissues are normal.  IMPRESSION: Acute nondisplaced RIGHT sacral zone 1 fracture.  No acute lumbar spine fracture or malalignment.   Electronically Signed   By: Elon Alas M.D.   On: 12/11/2014 22:20     EKG: Independently reviewed. Sinus Tachycardia rate = 135   Assessment/Plan:   44 y.o. female with  Principal Problem:   1.     Sacral fracture- From Mechanical Fall   Pain Control   Bedrest      Active Problems:   2.    Sinus tachycardia- Due to Pain, vs Sepsis vs Benzodiazepine Withdrawal   IVFs   Pain Control   Abx and IVFs for possible Sepsis   Urine Culture Sent   Check TSH Level   UDS sent   IV Lopressor 5 mg x 1 dose     3.    Fall-   Mechanical in  [place]   UDS ordered     4.    Sepsis-    Sepsis Protocol Initiated   IV Vancomycin and Zosyn   IVFs     5.    Lactic acidosis- due to Sepsis or Due to a Metabolic Acidosis   IVFs   Treatment of Sepsis        6.    Nausea and vomiting   PRN IV Zofran     7.    Leukocytosis- due to Stress Rxn, or Spesis   Monitor Trend     8.    Chronic lower back pain and  Intractable abdominal pain   Pain Control PRN     9.    Shingles (Herpes Zoster)-   Acyclovir PO    10.    DVT Prophylaxis   SCDs    Code Status:     FULL CODE Family Communication:   No Family Present   Disposition Plan:    Inpatient Status        Time spent:  [facility] Pager [phone]   If [place] Please Contact the Day Rounding Team MD for [facility]  If 7PM-7AM, Please Contact Night-Floor Coverage  [email or web address] Password [email or web address] 12/12/2014, 2:46 AM     ADDENDUM:   Patient was seen and examined on 12/12/2014

## 2014-12-12 NOTE — Progress Notes (Signed)
PROGRESS NOTE  [name] [id number] DOB: 1971-03-19 DOA: 12/11/2014 PCP: Leonard Downing, MD  Brief history 44 year old female with a history of hypertension, hyperlipidemia, anxiety, PTSD presented with a fall landing on her buttocks and her back. In the past 24 hours, the patient also began having numerous episodes of bilious vomiting and abdominal pain. She denies any fevers, chills, diarrhea, hematochezia, melena. There is been no recent sick contacts or she has not had any unusual travel or eating any raw or undercooked foods. At the time of presentation, the patient was noted to be tachycardic with heart rate 1:30-140 with a BBC 17.0 and lactic acid 4.27. The patient was treated for SIRS--fluids, intravenous antibiotics were started. CT of the lumbar spine revealed nondisplaced right sacral fracture. The patient states that 2-3 days prior to admission, she went to see her primary care physician who started her on acyclovir for shingles. Assessment/Plan: Lactic Acidosis/Tachycardia -Chest x-ray negative for infiltrates -Urinalysis negative for pyuria -Blood cultures 2 sets have been obtained -Continue intravenous fluids -Continue vancomycin and Zosyn pending culture data Nausea/vomiting/abdominal pain -CT abdomen and pelvis without contrast -Continue intravenous antiemetics -Start IV Reglan -Continue intravenous opiate -Convert essential medications to IV Herpes Zoster dermatitis -change to IV acyclovir Nondisplaced sacral fracture -will need PT when stable Anxiety/PTSD -change po valium to IV ativan -pt has not yet started cymbalta 30 mg daily as Rx by her PCP HTN -IV metoprolol -she quit taking amlodipine secondary to financial issues Sinus tachycardia -Reflexive secondary to the patient's acute medical condition   Family Communication:   Pt at beside Disposition Plan:   Remain in stepdown       Procedures/Studies: Ct Lumbar Spine Wo  Contrast  12/11/2014   CLINICAL DATA:  Tripped and fell onto tile floor at home today. No loss of consciousness. RIGHT hip pain. History of lumbar disc disease, arthritis.  EXAM: CT LUMBAR SPINE WITHOUT CONTRAST  TECHNIQUE: Multidetector CT imaging of the lumbar spine was performed without intravenous contrast administration. Multiplanar CT image reconstructions were also generated.  COMPARISON:  CT abdomen and pelvis Sep 01, 2014  FINDINGS: Lumbar vertebral bodies are intact and aligned with maintenance of the lumbar lordosis. Status post apparent LEFT L4-5 undersurface laminectomy. Mild L5-S1 disc height loss, stable from prior imaging. Scattered chronic Schmorl's nodes were present previously. No destructive bony lesions. Lumbar lordosis maintained.  Comminuted nondisplaced RIGHT sacral fracture, zone 1. No destructive bony lesions. Sacroiliac joints are intact, symmetric. Included soft tissues are normal.  IMPRESSION: Acute nondisplaced RIGHT sacral zone 1 fracture.  No acute lumbar spine fracture or malalignment.   Electronically Signed   By: Elon Alas M.D.   On: 12/11/2014 22:20   Dg Chest Port 1 View  12/12/2014   CLINICAL DATA:  Sepsis.  EXAM: PORTABLE CHEST - 1 VIEW  COMPARISON:  CT chest October 15, 2014.  FINDINGS: The heart size and mediastinal contours are within normal limits. Strandy densities LEFT lung base. No pleural effusion or focal consolidation. No pneumothorax. The visualized skeletal structures are unremarkable.  IMPRESSION: LEFT lung base atelectasis.   Electronically Signed   By: Elon Alas M.D.   On: 12/12/2014 03:56        Subjective: Patient complains of low back pain and abdominal pain she has had bilious emesis. She denies any chest pain, shortness breath, headache, dysuria, hematochezia, melena. She complains of some pain on the right flank breast area.  Objective: Filed Vitals:  12/12/14 0400 12/12/14 0500 12/12/14 0600 12/12/14 0700  BP: 162/109 166/106  166/111 169/105  Pulse: 128 131 124 106  Temp: 99.6 F (37.6 C)   99.6 F (37.6 C)  TempSrc: Oral   Oral  Resp: 14 18 14 16   Height: 5\' 5"  (1.651 m)     Weight: 79.5 kg (175 lb 4.3 oz)     SpO2: 93% 100% 100% 97%    Intake/Output Summary (Last 24 hours) at 12/12/14 1059 Last data filed at 12/12/14 0700  Gross per 24 hour  Intake 976.66 ml  Output     28 ml  Net 948.66 ml   Weight change:  Exam:   General:  Pt is alert, follows commands appropriately, not in acute distress  HEENT: No icterus, No thrush, No neck mass, [place]/AT; no meningismus  Cardiovascular: RRR, S1/S2, no rubs, no gallops  Respiratory: CTA bilaterally, no wheezing, no crackles, no rhonchi  Abdomen: Soft/+BS, LLQ/RLQ tenderness without any rebound, non distended, no guarding  Extremities: No edema, No lymphangitis, No petechiae, No rashes, no synovitis; vesicular type lesions on the right breast thoracic area without any lymphangitis or necrosis.  Data Reviewed: Basic Metabolic Panel:  Recent Labs Lab 12/11/14 2334 12/12/14 0740  NA 138 135  K 3.9 4.1  CL 104 104  CO2 16* 18*  GLUCOSE 102* 129*  BUN 10 <5*  CREATININE 0.73 0.63  CALCIUM 8.9 7.9*   Liver Function Tests:  Recent Labs Lab 12/11/14 2334  AST 46*  ALT 27  ALKPHOS 93  BILITOT 0.7  PROT 7.8  ALBUMIN 4.2    Recent Labs Lab 12/11/14 2334  LIPASE 12*   No results for input(s): AMMONIA in the last 168 hours. CBC:  Recent Labs Lab 12/12/14 0035 12/12/14 0740  WBC 17.0* 14.7*  NEUTROABS 13.5*  --   HGB 13.5 12.1  HCT 39.2 35.5*  MCV 90.1 90.6  PLT 269 233   Cardiac Enzymes: No results for input(s): CKTOTAL, CKMB, CKMBINDEX, TROPONINI in the last 168 hours. BNP: Invalid input(s): POCBNP CBG: No results for input(s): GLUCAP in the last 168 hours.  Recent Results (from the past 240 hour(s))  MRSA PCR Screening     Status: None   Collection Time: 12/12/14  3:28 AM  Result Value Ref Range Status   MRSA by PCR  NEGATIVE NEGATIVE Final    Comment:        The GeneXpert MRSA Assay (FDA approved for NASAL specimens only), is one component of a comprehensive MRSA colonization surveillance program. It is not intended to diagnose MRSA infection nor to guide or monitor treatment for MRSA infections.      Scheduled Meds: . sodium chloride  1,000 mL Intravenous Once  . acyclovir  10 mg/kg Intravenous 3 times per day  . LORazepam  1 mg Intravenous Q6H  . metoCLOPramide (REGLAN) injection  5 mg Intravenous 3 times per day  . metoprolol  5 mg Intravenous 4 times per day  . piperacillin-tazobactam (ZOSYN)  IV  3.375 g Intravenous 3 times per day  . vancomycin  750 mg Intravenous Q8H   Continuous Infusions: . sodium chloride 1,000 mL (12/12/14 0041)  . sodium chloride 100 mL/hr at 12/12/14 0353     Cru Kritikos, DO  [facility] Pager [phone]  If 7PM-7AM, please contact night-coverage [email or web address] Password [email or web address] 12/12/2014, 10:59 AM   LOS: 0 days

## 2014-12-13 ENCOUNTER — Inpatient Hospital Stay (HOSPITAL_COMMUNITY): Payer: Medicaid Other

## 2014-12-13 DIAGNOSIS — S3210XS Unspecified fracture of sacrum, sequela: Secondary | ICD-10-CM

## 2014-12-13 DIAGNOSIS — R509 Fever, unspecified: Secondary | ICD-10-CM

## 2014-12-13 LAB — COMPREHENSIVE METABOLIC PANEL
ALBUMIN: 3.9 g/dL (ref 3.5–5.0)
ALK PHOS: 85 U/L (ref 38–126)
ALT: 31 U/L (ref 14–54)
ANION GAP: 12 (ref 5–15)
AST: 36 U/L (ref 15–41)
BILIRUBIN TOTAL: 0.9 mg/dL (ref 0.3–1.2)
CALCIUM: 9 mg/dL (ref 8.9–10.3)
CO2: 26 mmol/L (ref 22–32)
Chloride: 98 mmol/L — ABNORMAL LOW (ref 101–111)
Creatinine, Ser: 0.56 mg/dL (ref 0.44–1.00)
GFR calc Af Amer: >60 mL/min (ref >60)
GLUCOSE: 134 mg/dL — AB (ref 65–99)
Potassium: 3.9 mmol/L (ref 3.5–5.1)
Sodium: 136 mmol/L (ref 135–145)
TOTAL PROTEIN: 8 g/dL (ref 6.5–8.1)

## 2014-12-13 LAB — CBC
HEMATOCRIT: 40.4 % (ref 36.0–46.0)
HEMOGLOBIN: 13.9 g/dL (ref 12.0–15.0)
MCH: 30.9 pg (ref 26.0–34.0)
MCHC: 34.4 g/dL (ref 30.0–36.0)
MCV: 89.8 fL (ref 78.0–100.0)
Platelets: 278 10*3/uL (ref 150–400)
RBC: 4.5 MIL/uL (ref 3.87–5.11)
RDW: 13.5 % (ref 11.5–15.5)
WBC: 12.7 10*3/uL — ABNORMAL HIGH (ref 4.0–10.5)

## 2014-12-13 MED ORDER — SODIUM CHLORIDE 0.9 % IV SOLN
INTRAVENOUS | Status: DC
  Administered 2014-12-13 – 2014-12-14 (×2): via INTRAVENOUS
  Filled 2014-12-13 (×7): qty 1000

## 2014-12-13 MED ORDER — HYDRALAZINE HCL 20 MG/ML IJ SOLN
5.0000 mg | Freq: Once | INTRAMUSCULAR | Status: AC
  Administered 2014-12-13: 5 mg via INTRAVENOUS
  Filled 2014-12-13: qty 1

## 2014-12-13 MED ORDER — METOPROLOL TARTRATE 1 MG/ML IV SOLN
7.5000 mg | Freq: Four times a day (QID) | INTRAVENOUS | Status: AC
  Administered 2014-12-13 – 2014-12-14 (×5): 7.5 mg via INTRAVENOUS
  Filled 2014-12-13 (×6): qty 10

## 2014-12-13 MED ORDER — DEXTROSE 5 % IV SOLN
10.0000 mg/kg | Freq: Three times a day (TID) | INTRAVENOUS | Status: DC
  Administered 2014-12-13 – 2014-12-19 (×19): 570 mg via INTRAVENOUS
  Filled 2014-12-13 (×26): qty 11.4

## 2014-12-13 MED ORDER — METOPROLOL TARTRATE 25 MG PO TABS
25.0000 mg | ORAL_TABLET | Freq: Once | ORAL | Status: AC
  Administered 2014-12-13: 25 mg via ORAL
  Filled 2014-12-13: qty 1

## 2014-12-13 MED ORDER — DIAZEPAM 5 MG PO TABS
5.0000 mg | ORAL_TABLET | Freq: Once | ORAL | Status: AC
  Administered 2014-12-13: 5 mg via ORAL
  Filled 2014-12-13: qty 1

## 2014-12-13 NOTE — Progress Notes (Signed)
[facility] made aware of continued BP issues despite adding hydralazine. Order received for a one-time dose of PO lopressor.

## 2014-12-13 NOTE — Progress Notes (Signed)
[facility] made aware of continued hypertension despite receiving IV Ativan, dilaudid and lopressor. Order received for hydralazine.

## 2014-12-13 NOTE — Progress Notes (Signed)
PROGRESS NOTE  [name] [id number] DOB: 01/23/1971 DOA: 12/11/2014 PCP: Leonard Downing, MD  Brief history 44 year old female with a history of hypertension, hyperlipidemia, anxiety, PTSD presented with a fall landing on her buttocks and her back. In the past 24 hours, the patient also began having numerous episodes of bilious vomiting and abdominal pain. She denies any fevers, chills, diarrhea, hematochezia, melena. There is been no recent sick contacts or she has not had any unusual travel or eating any raw or undercooked foods. At the time of presentation, the patient was noted to be tachycardic with heart rate 1:30-140 with a BBC 17.0 and lactic acid 4.27. The patient was treated for SIRS--fluids, intravenous antibiotics were started. CT of the lumbar spine revealed nondisplaced right sacral fracture. The patient states that 2-3 days prior to admission, she went to see her primary care physician who started her on acyclovir for shingles. Assessment/Plan: Lactic Acidosis/Tachycardia -Chest x-ray negative for infiltrates-->repeat today after fluid resuscitation -Urinalysis negative for pyuria -Blood cultures 2 sets have been obtained--follow results -Continue intravenous fluids -Continue vancomycin and Zosyn pending culture data -Echo -TSH -Increase metoprolol to 7.5 mg IV every 6 hours Nausea/vomiting/abdominal pain -CT abdomen and pelvis without contrast--essentially negative except for distended urinary bladder -Continue intravenous antiemetics -Start IV Reglan -Continue intravenous opiate -Convert essential medications to IV -12/13/2014--Vomiting has improved--patient wants to eat--> start full liquids -12/13/2014-Abdominal pain is improving, low back pain is the main issue -Plan to convert medications to by mouth if able to tolerate full liquids Herpes Zoster dermatitis -change to IV acyclovir Nondisplaced sacral fracture -PT  evaluation Anxiety/PTSD -change po valium to IV ativan -pt has not yet started cymbalta 30 mg daily as Rx by her PCP HTN -IV metoprolol--increased to 7.5 mg IV every 6 hours -she quit taking amlodipine secondary to financial issues Sinus tachycardia -Reflexive secondary to the patient's acute medical condition -TSH -Echocardiogram  Family Communication: Pt at beside Disposition Plan: Transfer to telemetry       Procedures/Studies: Ct Abdomen Pelvis Wo Contrast  12/12/2014   CLINICAL DATA:  Patient with severe abdominal pain, vomiting and leukocytosis.  EXAM: CT ABDOMEN AND PELVIS WITHOUT CONTRAST  TECHNIQUE: Multidetector CT imaging of the abdomen and pelvis was performed following the standard protocol without IV contrast.  COMPARISON:  CT abdomen pelvis 10/15/2014  FINDINGS: Lower chest: Normal heart size. Dependent atelectasis within the bilateral lower lobes. No pleural effusion.  Hepatobiliary: Liver is diffusely low in attenuation compatible with hepatic steatosis. Fatty sparing adjacent to the gallbladder fossa. Gallbladder is unremarkable. No intrahepatic or extrahepatic biliary ductal dilatation.  Pancreas: Unremarkable  Spleen:  Unremarkable  Adrenals/Urinary Tract: Normal adrenal glands. Kidneys are symmetric in size. No hydronephrosis. The urinary bladder is markedly distended.  Stomach/Bowel: The appendix is normal. No abnormal bowel wall thickening or evidence for bowel obstruction. No free fluid or free intraperitoneal air.  Vascular/Lymphatic: Normal caliber abdominal aorta. No retroperitoneal lymphadenopathy.  Other: The uterus is unremarkable.  Musculoskeletal: No aggressive or acute appearing osseous lesions.  IMPRESSION: The urinary bladder is markedly distended. Recommend catheter decompression.  Otherwise no acute process within the abdomen or pelvis.  Hepatic steatosis.   Electronically Signed   By: Lovey Newcomer M.D.   On: 12/12/2014 13:25   Ct Lumbar Spine Wo  Contrast  12/11/2014   CLINICAL DATA:  Tripped and fell onto tile floor at home today. No loss of consciousness. RIGHT hip pain. History of lumbar disc  disease, arthritis.  EXAM: CT LUMBAR SPINE WITHOUT CONTRAST  TECHNIQUE: Multidetector CT imaging of the lumbar spine was performed without intravenous contrast administration. Multiplanar CT image reconstructions were also generated.  COMPARISON:  CT abdomen and pelvis Sep 01, 2014  FINDINGS: Lumbar vertebral bodies are intact and aligned with maintenance of the lumbar lordosis. Status post apparent LEFT L4-5 undersurface laminectomy. Mild L5-S1 disc height loss, stable from prior imaging. Scattered chronic Schmorl's nodes were present previously. No destructive bony lesions. Lumbar lordosis maintained.  Comminuted nondisplaced RIGHT sacral fracture, zone 1. No destructive bony lesions. Sacroiliac joints are intact, symmetric. Included soft tissues are normal.  IMPRESSION: Acute nondisplaced RIGHT sacral zone 1 fracture.  No acute lumbar spine fracture or malalignment.   Electronically Signed   By: Elon Alas M.D.   On: 12/11/2014 22:20   Dg Chest Port 1 View  12/12/2014   CLINICAL DATA:  Sepsis.  EXAM: PORTABLE CHEST - 1 VIEW  COMPARISON:  CT chest October 15, 2014.  FINDINGS: The heart size and mediastinal contours are within normal limits. Strandy densities LEFT lung base. No pleural effusion or focal consolidation. No pneumothorax. The visualized skeletal structures are unremarkable.  IMPRESSION: LEFT lung base atelectasis.   Electronically Signed   By: Elon Alas M.D.   On: 12/12/2014 03:56         Subjective: Patient states that abdominal pain is improved as well as vomiting. She is complaining of low back pain. Denies fevers, chills, chest pain, shortness breath, abdominal pain. She is not passing flatus, nor is she had a bowel movement. Denies any dysuria or hematuria.   Objective: Filed Vitals:   12/13/14 0329 12/13/14 0556  12/13/14 0654 12/13/14 0750  BP: 181/119 174/116 156/99 142/108  Pulse: 125 127 114 120  Temp: 99.7 F (37.6 C)   99.3 F (37.4 C)  TempSrc: Oral   Oral  Resp: 15 16 10 26   Height:      Weight:      SpO2: 100% 100% 96% 97%    Intake/Output Summary (Last 24 hours) at 12/13/14 1156 Last data filed at 12/13/14 0900  Gross per 24 hour  Intake   2675 ml  Output   1400 ml  Net   1275 ml   Weight change:  Exam:   General:  Pt is alert, follows commands appropriately, not in acute distress  HEENT: No icterus, No thrush, No neck mass, /AT  Cardiovascular: RRR, S1/S2, no rubs, no gallops  Respiratory: CTA bilaterally, no wheezing, no crackles, no rhonchi  Abdomen: Soft/+BS, non tender, non distended, no guarding; no hepatosplenomegaly   Extremities: No edema, No lymphangitis, No petechiae, No rashes, no synovitis; no cyanosis or clubbing  Data Reviewed: Basic Metabolic Panel:  Recent Labs Lab 12/11/14 2334 12/12/14 0740 12/13/14 0255  NA 138 135 136  K 3.9 4.1 3.9  CL 104 104 98*  CO2 16* 18* 26  GLUCOSE 102* 129* 134*  BUN 10 <5* <5*  CREATININE 0.73 0.63 0.56  CALCIUM 8.9 7.9* 9.0   Liver Function Tests:  Recent Labs Lab 12/11/14 2334 12/13/14 0255  AST 46* 36  ALT 27 31  ALKPHOS 93 85  BILITOT 0.7 0.9  PROT 7.8 8.0  ALBUMIN 4.2 3.9    Recent Labs Lab 12/11/14 2334  LIPASE 12*   No results for input(s): AMMONIA in the last 168 hours. CBC:  Recent Labs Lab 12/12/14 0035 12/12/14 0740 12/13/14 0255  WBC 17.0* 14.7* 12.7*  NEUTROABS 13.5*  --   --  HGB 13.5 12.1 13.9  HCT 39.2 35.5* 40.4  MCV 90.1 90.6 89.8  PLT 269 233 278   Cardiac Enzymes: No results for input(s): CKTOTAL, CKMB, CKMBINDEX, TROPONINI in the last 168 hours. BNP: Invalid input(s): POCBNP CBG: No results for input(s): GLUCAP in the last 168 hours.  Recent Results (from the past 240 hour(s))  MRSA PCR Screening     Status: None   Collection Time: 12/12/14  3:28 AM   Result Value Ref Range Status   MRSA by PCR NEGATIVE NEGATIVE Final    Comment:        The GeneXpert MRSA Assay (FDA approved for NASAL specimens only), is one component of a comprehensive MRSA colonization surveillance program. It is not intended to diagnose MRSA infection nor to guide or monitor treatment for MRSA infections.      Scheduled Meds: . sodium chloride  1,000 mL Intravenous Once  . acyclovir  10 mg/kg (Ideal) Intravenous 3 times per day  . LORazepam  1 mg Intravenous Q6H  . metoCLOPramide (REGLAN) injection  5 mg Intravenous 3 times per day  . metoprolol  7.5 mg Intravenous 4 times per day  . piperacillin-tazobactam (ZOSYN)  IV  3.375 g Intravenous 3 times per day  . vancomycin  750 mg Intravenous Q8H   Continuous Infusions: . sodium chloride 1,000 mL (12/12/14 0041)  . sodium chloride 0.9 % 1,000 mL with potassium chloride 20 mEq infusion       Kristiane Morsch, DO  [facility] Pager [phone]  If 7PM-7AM, please contact night-coverage [email or web address] Password [facility] 12/13/2014, 11:56 AM   LOS: 1 day

## 2014-12-13 NOTE — Progress Notes (Signed)
[facility] made aware of continued high BP. Pt is also shaking at times. PO dose of valium ordered x1.

## 2014-12-13 NOTE — Progress Notes (Signed)
  Echocardiogram 2D Echocardiogram has been performed.  [name] 12/13/2014, 2:32 PM

## 2014-12-13 NOTE — Progress Notes (Signed)
Area of shingles are flat and without blistering or wheeping any fluid. Spot are red and flat under R breast, under R axilla, and on right side of back. Infectious Disease Nurse said based on hospital policy, isolation can be removed.

## 2014-12-14 LAB — BASIC METABOLIC PANEL
ANION GAP: 8 (ref 5–15)
CALCIUM: 8.9 mg/dL (ref 8.9–10.3)
CO2: 27 mmol/L (ref 22–32)
Chloride: 102 mmol/L (ref 101–111)
Creatinine, Ser: 0.5 mg/dL (ref 0.44–1.00)
GFR calc Af Amer: >60 mL/min (ref >60)
GLUCOSE: 108 mg/dL — AB (ref 65–99)
Potassium: 3.8 mmol/L (ref 3.5–5.1)
SODIUM: 137 mmol/L (ref 135–145)

## 2014-12-14 LAB — TSH: TSH: 1.464 u[IU]/mL (ref 0.350–4.500)

## 2014-12-14 LAB — LACTIC ACID, PLASMA: LACTIC ACID, VENOUS: 1 mmol/L (ref 0.5–2.0)

## 2014-12-14 MED ORDER — CETYLPYRIDINIUM CHLORIDE 0.05 % MT LIQD
7.0000 mL | Freq: Two times a day (BID) | OROMUCOSAL | Status: DC
  Administered 2014-12-14 – 2014-12-19 (×7): 7 mL via OROMUCOSAL

## 2014-12-14 MED ORDER — HYDRALAZINE HCL 20 MG/ML IJ SOLN
5.0000 mg | INTRAMUSCULAR | Status: DC | PRN
  Administered 2014-12-14: 5 mg via INTRAVENOUS
  Filled 2014-12-14: qty 1

## 2014-12-14 MED ORDER — METOCLOPRAMIDE HCL 5 MG PO TABS
5.0000 mg | ORAL_TABLET | Freq: Three times a day (TID) | ORAL | Status: DC
  Administered 2014-12-15 – 2014-12-20 (×15): 5 mg via ORAL
  Filled 2014-12-14 (×17): qty 1

## 2014-12-14 MED ORDER — DIAZEPAM 5 MG PO TABS
5.0000 mg | ORAL_TABLET | Freq: Four times a day (QID) | ORAL | Status: DC
  Administered 2014-12-14 – 2014-12-20 (×21): 5 mg via ORAL
  Filled 2014-12-14 (×21): qty 1

## 2014-12-14 MED ORDER — METOPROLOL TARTRATE 50 MG PO TABS
50.0000 mg | ORAL_TABLET | Freq: Two times a day (BID) | ORAL | Status: DC
  Administered 2014-12-14: 50 mg via ORAL
  Filled 2014-12-14: qty 1

## 2014-12-14 NOTE — Evaluation (Signed)
Physical Therapy Evaluation Patient Details Name: [name] MRN: [id number] DOB: [birth year] Today's Date: 12/14/2014   History of Present Illness  44 year old female with a history of hypertension, hyperlipidemia, anxiety, PTSD presented with a fall landing on her buttocks and her back. In the past 24 hours, the patient also began having numerous episodes of bilious vomiting and abdominal pain. She denies any fevers, chills, diarrhea, hematochezia, melena. There is been no recent sick contacts or she has not had any unusual travel or eating any raw or undercooked foods. At the time of presentation, the patient was noted to be tachycardic with heart rate 1:30-140 with a BBC 17.0 and lactic acid 4.27. The patient was treated for SIRS--fluids, intravenous antibiotics were started. CT of the lumbar spine revealed nondisplaced right sacral fracture. The patient states that 2-3 days prior to admission, she went to see her primary care physician who started her on acyclovir for shingles.  Clinical Impression  Pt admitted with above diagnosis. Pt currently with functional limitations due to the deficits listed below (see PT Problem List). Pt able to take a few steps to chair with RW.  L;imited by pain. Will follow acutely.  Husband states he can get equipment possibly from a friend.   Pt will benefit from skilled PT to increase their independence and safety with mobility to allow discharge to the venue listed below.      Follow Up Recommendations Home health PT;Supervision/Assistance - 24 hour    Equipment Recommendations  Other (comment);Wheelchair (measurements PT);Wheelchair cushion (measurements PT) (rollator)    Recommendations for Other Services       Precautions / Restrictions Precautions Precautions: Fall Restrictions Weight Bearing Restrictions: No      Mobility  Bed Mobility Overal bed mobility: Needs Assistance;+ 2 for safety/equipment Bed Mobility: Supine to Sit     Supine  to sit: Min assist;+2 for physical assistance     General bed mobility comments: Needed assist for LEs and trunk elevation due to anxiety and pain.   Transfers Overall transfer level: Needs assistance Equipment used: Rolling walker (2 wheeled) Transfers: Sit to/from [facility] Sit to Stand: Min guard;+2 safety/equipment Stand pivot transfers: Min guard;+2 safety/equipment       General transfer comment: Pt able to stand with cues only not needing physical assist but 2 persons present due to pt anxious.  Pt at times crying out in pain.  Was able to pivot around to recliner with RW with cues to weight bear on UEs.    Ambulation/Gait                Stairs            Wheelchair Mobility    Modified Rankin (Stroke Patients Only)       Balance Overall balance assessment: Needs assistance;History of Falls Sitting-balance support: Bilateral upper extremity supported;Feet supported Sitting balance-Leahy Scale: Poor Sitting balance - Comments: requires UE support   Standing balance support: Bilateral upper extremity supported;During functional activity Standing balance-Leahy Scale: Poor Standing balance comment: requires UE support with RW.                              Pertinent Vitals/Pain Pain Assessment: 0-10 Pain Score: 6  Pain Location: pelvis Pain Descriptors / Indicators: Aching Pain Intervention(s): Premedicated before session;Repositioned;Monitored during session;Limited activity within patient's tolerance  HR 122 bpm, 124/84, 93% O2 on RA.     Home Living Family/patient expects to  be discharged to:: Private residence Living Arrangements: Spouse/significant other;Children Available Help at Discharge: Family;Available 24 hours/day Type of Home: House Home Access: Stairs to enter Entrance Stairs-Rails: None Entrance Stairs-Number of Steps: 3 Home Layout: One level Home Equipment: Walker - standard;Bedside commode;Shower  seat;Crutches      Prior Function Level of Independence: Independent               Hand Dominance        Extremity/Trunk Assessment   Upper Extremity Assessment: Defer to OT evaluation           Lower Extremity Assessment: Generalized weakness      Cervical / Trunk Assessment: Normal  Communication   Communication: No difficulties  Cognition Arousal/Alertness: Awake/alert Behavior During Therapy: Anxious Overall Cognitive Status: Within Functional Limits for tasks assessed                      General Comments      Exercises        Assessment/Plan    PT Assessment Patient needs continued PT services  PT Diagnosis Generalized weakness;Acute pain   PT Problem List Decreased balance;Decreased activity tolerance;Decreased mobility;Decreased knowledge of use of DME;Decreased safety awareness;Decreased knowledge of precautions;Pain  PT Treatment Interventions DME instruction;Gait training;Functional mobility training;Therapeutic activities;Therapeutic exercise;Balance training;Patient/family education;Stair training   PT Goals (Current goals can be found in the Care Plan section) Acute Rehab PT Goals Patient Stated Goal: to go home PT Goal Formulation: With patient Time For Goal Achievement: 12/28/14 Potential to Achieve Goals: Good    Frequency Min 5X/week   Barriers to discharge        Co-evaluation               End of Session Equipment Utilized During Treatment: Gait belt Activity Tolerance: Patient limited by fatigue;Patient limited by pain Patient left: in chair;with call bell/phone within reach;with chair alarm set;with family/visitor present Nurse Communication: Mobility status;Patient requests pain meds         Time: 1032-1055 PT Time Calculation (min) (ACUTE ONLY): 23 min   Charges:   PT Evaluation $Initial PT Evaluation Tier I: 1 Procedure PT Treatments $Therapeutic Activity: 8-22 mins   PT G CodesDenice Paradise 01-04-2015, 11:49 AM  [facility] [phone] (pager)

## 2014-12-14 NOTE — Progress Notes (Signed)
PROGRESS NOTE  [name] [id number] DOB: Dec 25, 1970 DOA: 12/11/2014 PCP: Leonard Downing, MD  Brief history 44 year old female with a history of hypertension, hyperlipidemia, anxiety, PTSD presented with a fall landing on her buttocks and her back. In the past 24 hours, the patient also began having numerous episodes of bilious vomiting and abdominal pain. She denies any fevers, chills, diarrhea, hematochezia, melena. There is been no recent sick contacts or she has not had any unusual travel or eating any raw or undercooked foods. At the time of presentation, the patient was noted to be tachycardic with heart rate 130-140 with a WBC 17.0 and lactic acid 4.27. The patient was treated for SIRS--fluids, intravenous antibiotics were started. CT of the lumbar spine revealed nondisplaced right sacral fracture. The patient states that 2-3 days prior to admission, she went to see her primary care physician who started her on acyclovir for shingles. Since admission, the patient was converted to intravenous acyclovir due to her vomiting and inability to tolerate oral medications. Assessment/Plan: Lactic Acidosis/Tachycardia -Chest x-ray negative for infiltrates-->repeated 8/22 after fluid resuscitation--> still negative for acute findings.  -Urinalysis negative for pyuria -Blood cultures 2 sets have been obtained--follow results -Continue intravenous fluids-->improving HR--also with metoprolol -Cultures negative --> 8/23 discontinue all antibiotics and observe clinically  -Procalcitonin 0.17  -Echo--EF 55-60%, no WMA -TSH--1.46 Nausea/vomiting/abdominal pain -CT abdomen and pelvis without contrast--essentially negative except for distended urinary bladder--which patient has required intermittent catheterizations  -Patient otherwise has been able to urinate spontaneously  -Continue intravenous antiemetics prn -12/13/2014--Vomiting has improved--patient wants to eat--> start full  liquids -12/14/2014--patient is clinically improving--advance to soft diet -Start IV Reglan-->converted to po -intravenous opiate-->convert to po -convert metoprolol to po -Convert IV Ativan to PO Valium home dose   Herpes Zoster dermatitis -change to IV acyclovir -can switch to po acyclovir when ready to d/c to finish 7 days of tx Nondisplaced sacral fracture -PT evaluation-->home health PT -discussed with Ortho Dr. Dalbert Batman and outpt followup Anxiety/PTSD -change po valium -pt has not yet started cymbalta 30 mg daily as Rx by her PCP HTN -convert back to po metoprolol tartrate -she quit taking amlodipine secondary to financial issues Sinus tachycardia -Reflexive secondary to the patient's acute medical condition -TSH--1.464 -Echocardiogram--EF 55-60 percent, no WMA  Family Communication: Husband update at beside 8/23 Disposition Plan: Possible home 8/24    Procedures/Studies: Ct Abdomen Pelvis Wo Contrast  12/12/2014   CLINICAL DATA:  Patient with severe abdominal pain, vomiting and leukocytosis.  EXAM: CT ABDOMEN AND PELVIS WITHOUT CONTRAST  TECHNIQUE: Multidetector CT imaging of the abdomen and pelvis was performed following the standard protocol without IV contrast.  COMPARISON:  CT abdomen pelvis 10/15/2014  FINDINGS: Lower chest: Normal heart size. Dependent atelectasis within the bilateral lower lobes. No pleural effusion.  Hepatobiliary: Liver is diffusely low in attenuation compatible with hepatic steatosis. Fatty sparing adjacent to the gallbladder fossa. Gallbladder is unremarkable. No intrahepatic or extrahepatic biliary ductal dilatation.  Pancreas: Unremarkable  Spleen:  Unremarkable  Adrenals/Urinary Tract: Normal adrenal glands. Kidneys are symmetric in size. No hydronephrosis. The urinary bladder is markedly distended.  Stomach/Bowel: The appendix is normal. No abnormal bowel wall thickening or evidence for bowel obstruction. No free fluid or free  intraperitoneal air.  Vascular/Lymphatic: Normal caliber abdominal aorta. No retroperitoneal lymphadenopathy.  Other: The uterus is unremarkable.  Musculoskeletal: No aggressive or acute appearing osseous lesions.  IMPRESSION: The urinary bladder is markedly distended. Recommend catheter decompression.  Otherwise no acute process within the abdomen or pelvis.  Hepatic steatosis.   Electronically Signed   By: Lovey Newcomer M.D.   On: 12/12/2014 13:25   Ct Lumbar Spine Wo Contrast  12/11/2014   CLINICAL DATA:  Tripped and fell onto tile floor at home today. No loss of consciousness. RIGHT hip pain. History of lumbar disc disease, arthritis.  EXAM: CT LUMBAR SPINE WITHOUT CONTRAST  TECHNIQUE: Multidetector CT imaging of the lumbar spine was performed without intravenous contrast administration. Multiplanar CT image reconstructions were also generated.  COMPARISON:  CT abdomen and pelvis Sep 01, 2014  FINDINGS: Lumbar vertebral bodies are intact and aligned with maintenance of the lumbar lordosis. Status post apparent LEFT L4-5 undersurface laminectomy. Mild L5-S1 disc height loss, stable from prior imaging. Scattered chronic Schmorl's nodes were present previously. No destructive bony lesions. Lumbar lordosis maintained.  Comminuted nondisplaced RIGHT sacral fracture, zone 1. No destructive bony lesions. Sacroiliac joints are intact, symmetric. Included soft tissues are normal.  IMPRESSION: Acute nondisplaced RIGHT sacral zone 1 fracture.  No acute lumbar spine fracture or malalignment.   Electronically Signed   By: Elon Alas M.D.   On: 12/11/2014 22:20   Dg Chest Port 1 View  12/13/2014   CLINICAL DATA:  Shortness of breath, recent fall with back pain  EXAM: PORTABLE CHEST - 1 VIEW  COMPARISON:  12/12/2014  FINDINGS: The heart size and mediastinal contours are within normal limits. Both lungs are clear. The visualized skeletal structures are unremarkable.  IMPRESSION: No active disease.   Electronically  Signed   By: Inez Catalina M.D.   On: 12/13/2014 12:52   Dg Chest Port 1 View  12/12/2014   CLINICAL DATA:  Sepsis.  EXAM: PORTABLE CHEST - 1 VIEW  COMPARISON:  CT chest October 15, 2014.  FINDINGS: The heart size and mediastinal contours are within normal limits. Strandy densities LEFT lung base. No pleural effusion or focal consolidation. No pneumothorax. The visualized skeletal structures are unremarkable.  IMPRESSION: LEFT lung base atelectasis.   Electronically Signed   By: Elon Alas M.D.   On: 12/12/2014 03:56         Subjective: patient complains of low back pain and buttock pain. Denies any fevers, does, chest pain, short of breath, vomiting, diarrhea. She is passing flatus. Abdominal pain has improved. Denies any headaches.   Objective: Filed Vitals:   12/14/14 1117 12/14/14 1200 12/14/14 1400 12/14/14 1625  BP: 145/103 128/79 143/96 138/97  Pulse: 120 118 103 105  Temp: 98.1 F (36.7 C)   98.4 F (36.9 C)  TempSrc: Oral   Oral  Resp: 12 23 11 11   Height:      Weight:      SpO2: 95% 94% 93% 97%    Intake/Output Summary (Last 24 hours) at 12/14/14 1811 Last data filed at 12/14/14 1400  Gross per 24 hour  Intake 3871.4 ml  Output   1300 ml  Net 2571.4 ml   Weight change:  Exam:   General:  Pt is alert, follows commands appropriately, not in acute distress  HEENT: No icterus, No thrush, No neck mass, [place]/AT  Cardiovascular: RRR, S1/S2, no rubs, no gallops  Respiratory: CTA bilaterally, no wheezing, no crackles, no rhonchi  Abdomen: Soft/+BS, non tender, non distended, no guarding  Extremities: No edema, No lymphangitis, No petechiae, No rashes, no synovitis  Data Reviewed: Basic Metabolic Panel:  Recent Labs Lab 12/11/14 2334 12/12/14 0740 12/13/14 0255 12/14/14 0251  NA 138 135 136  137  K 3.9 4.1 3.9 3.8  CL 104 104 98* 102  CO2 16* 18* 26 27  GLUCOSE 102* 129* 134* 108*  BUN 10 <5* <5* <5*  CREATININE 0.73 0.63 0.56 0.50  CALCIUM 8.9 7.9*  9.0 8.9   Liver Function Tests:  Recent Labs Lab 12/11/14 2334 12/13/14 0255  AST 46* 36  ALT 27 31  ALKPHOS 93 85  BILITOT 0.7 0.9  PROT 7.8 8.0  ALBUMIN 4.2 3.9    Recent Labs Lab 12/11/14 2334  LIPASE 12*   No results for input(s): AMMONIA in the last 168 hours. CBC:  Recent Labs Lab 12/12/14 0035 12/12/14 0740 12/13/14 0255  WBC 17.0* 14.7* 12.7*  NEUTROABS 13.5*  --   --   HGB 13.5 12.1 13.9  HCT 39.2 35.5* 40.4  MCV 90.1 90.6 89.8  PLT 269 233 278   Cardiac Enzymes: No results for input(s): CKTOTAL, CKMB, CKMBINDEX, TROPONINI in the last 168 hours. BNP: Invalid input(s): POCBNP CBG: No results for input(s): GLUCAP in the last 168 hours.  Recent Results (from the past 240 hour(s))  Culture, blood (routine x 2)     Status: None (Preliminary result)   Collection Time: 12/12/14 12:25 AM  Result Value Ref Range Status   Specimen Description BLOOD LEFT ARM  Final   Special Requests BOTTLES DRAWN AEROBIC AND ANAEROBIC 5CC   Final   Culture NO GROWTH 2 DAYS  Final   Report Status PENDING  Incomplete  Culture, blood (routine x 2)     Status: None (Preliminary result)   Collection Time: 12/12/14 12:35 AM  Result Value Ref Range Status   Specimen Description BLOOD LEFT HAND  Final   Special Requests BOTTLES DRAWN AEROBIC ONLY 5CC  Final   Culture NO GROWTH 2 DAYS  Final   Report Status PENDING  Incomplete  MRSA PCR Screening     Status: None   Collection Time: 12/12/14  3:28 AM  Result Value Ref Range Status   MRSA by PCR NEGATIVE NEGATIVE Final    Comment:        The GeneXpert MRSA Assay (FDA approved for NASAL specimens only), is one component of a comprehensive MRSA colonization surveillance program. It is not intended to diagnose MRSA infection nor to guide or monitor treatment for MRSA infections.      Scheduled Meds: . sodium chloride  1,000 mL Intravenous Once  . acyclovir  10 mg/kg (Ideal) Intravenous 3 times per day  . antiseptic oral  rinse  7 mL Mouth Rinse BID  . diazepam  5 mg Oral Q6H  . LORazepam  1 mg Intravenous Q6H  . metoCLOPramide (REGLAN) injection  5 mg Intravenous 3 times per day  . [START ON 12/15/2014] metoCLOPramide  5 mg Oral TID AC  . metoprolol  7.5 mg Intravenous 4 times per day  . metoprolol tartrate  50 mg Oral BID   Continuous Infusions: . sodium chloride 1,000 mL (12/12/14 0041)  . sodium chloride 0.9 % 1,000 mL with potassium chloride 20 mEq infusion 125 mL/hr at 12/14/14 1139     Javarus Dorner, DO  [facility] Pager [phone]  If 7PM-7AM, please contact night-coverage [email or web address] Password [email or web address] 12/14/2014, 6:11 PM   LOS: 2 days

## 2014-12-14 NOTE — Progress Notes (Signed)
[facility] notified of patient's BP staying around 159/104. Orders received for hydralazine prn.

## 2014-12-14 NOTE — Progress Notes (Signed)
Patient unable to void this shift. She stated that she did not feel like she needed to void. It has been greater than 12 hours since last documented void. She has fluids going at 125 cc/h. Bladder scan done and showed 711 cc's. [facility] notified and orders to in and out cath prn given.

## 2014-12-14 NOTE — Progress Notes (Signed)
Straight cath performed per orders using sterile technique. Was able to drain 800 cc's clear yellow urine. No problems noted. Patient tolerated well.

## 2014-12-15 DIAGNOSIS — I471 Supraventricular tachycardia: Secondary | ICD-10-CM

## 2014-12-15 DIAGNOSIS — B0089 Other herpesviral infection: Secondary | ICD-10-CM | POA: Diagnosis present

## 2014-12-15 DIAGNOSIS — S3210XA Unspecified fracture of sacrum, initial encounter for closed fracture: Secondary | ICD-10-CM

## 2014-12-15 DIAGNOSIS — R1110 Vomiting, unspecified: Secondary | ICD-10-CM

## 2014-12-15 DIAGNOSIS — E872 Acidosis: Secondary | ICD-10-CM

## 2014-12-15 LAB — BASIC METABOLIC PANEL
Anion gap: 11 (ref 5–15)
CHLORIDE: 100 mmol/L — AB (ref 101–111)
CO2: 24 mmol/L (ref 22–32)
CREATININE: 0.53 mg/dL (ref 0.44–1.00)
Calcium: 8.3 mg/dL — ABNORMAL LOW (ref 8.9–10.3)
GFR calc Af Amer: >60 mL/min (ref >60)
GFR calc non Af Amer: >60 mL/min (ref >60)
Glucose, Bld: 118 mg/dL — ABNORMAL HIGH (ref 65–99)
Potassium: 3.7 mmol/L (ref 3.5–5.1)
Sodium: 135 mmol/L (ref 135–145)

## 2014-12-15 LAB — CBC
HCT: 35.1 % — ABNORMAL LOW (ref 36.0–46.0)
Hemoglobin: 11.4 g/dL — ABNORMAL LOW (ref 12.0–15.0)
MCH: 30.3 pg (ref 26.0–34.0)
MCHC: 32.5 g/dL (ref 30.0–36.0)
MCV: 93.4 fL (ref 78.0–100.0)
PLATELETS: 290 10*3/uL (ref 150–400)
RBC: 3.76 MIL/uL — ABNORMAL LOW (ref 3.87–5.11)
RDW: 14.1 % (ref 11.5–15.5)
WBC: 10.3 10*3/uL (ref 4.0–10.5)

## 2014-12-15 MED ORDER — POTASSIUM CHLORIDE IN NACL 20-0.9 MEQ/L-% IV SOLN
INTRAVENOUS | Status: DC
  Administered 2014-12-15: 04:00:00 via INTRAVENOUS
  Filled 2014-12-15: qty 1000

## 2014-12-15 MED ORDER — SODIUM CHLORIDE 0.9 % IV BOLUS (SEPSIS)
1000.0000 mL | Freq: Once | INTRAVENOUS | Status: AC
  Administered 2014-12-15: 1000 mL via INTRAVENOUS

## 2014-12-15 MED ORDER — METOPROLOL TARTRATE 50 MG PO TABS
75.0000 mg | ORAL_TABLET | Freq: Two times a day (BID) | ORAL | Status: DC
  Administered 2014-12-15: 75 mg via ORAL
  Filled 2014-12-15 (×2): qty 1

## 2014-12-15 MED ORDER — HYDROCODONE-ACETAMINOPHEN 10-325 MG PO TABS
1.0000 | ORAL_TABLET | Freq: Four times a day (QID) | ORAL | Status: DC | PRN
  Administered 2014-12-15 – 2014-12-20 (×16): 1 via ORAL
  Filled 2014-12-15 (×17): qty 1

## 2014-12-15 NOTE — Progress Notes (Signed)
Physical Therapy Treatment Patient Details Name: [name] MRN: [id number] DOB: [birth year] Today's Date: 12/15/2014    History of Present Illness 44 year old female with a history of hypertension, hyperlipidemia, anxiety, PTSD presented with a fall landing on her buttocks and her back. In the past 24 hours, the patient also began having numerous episodes of bilious vomiting and abdominal pain. She denies any fevers, chills, diarrhea, hematochezia, melena. There is been no recent sick contacts or she has not had any unusual travel or eating any raw or undercooked foods. At the time of presentation, the patient was noted to be tachycardic with heart rate 1:30-140 with a BBC 17.0 and lactic acid 4.27. The patient was treated for SIRS--fluids, intravenous antibiotics were started. CT of the lumbar spine revealed nondisplaced right sacral fracture. The patient states that 2-3 days prior to admission, she went to see her primary care physician who started her on acyclovir for shingles.    PT Comments    Patient able to work on ambulation this session although limited by pain. Up in chair at end of session with complaints of nausea. Continue with current POC. Patient with have assistance from her husband at home. Will need to work on steps prior to DC home.   Follow Up Recommendations  Home health PT;Supervision/Assistance - 24 hour     Equipment Recommendations  Other (comment);Wheelchair (measurements PT);Wheelchair cushion (measurements PT)    Recommendations for Other Services       Precautions / Restrictions Precautions Precautions: Fall    Mobility  Bed Mobility Overal bed mobility: Needs Assistance Bed Mobility: Supine to Sit     Supine to sit: Min assist     General bed mobility comments: Needed assist for LEs and trunk elevation due to anxiety and pain.   Transfers Overall transfer level: Needs assistance Equipment used: Rolling walker (2 wheeled)   Sit to Stand: Min  assist         General transfer comment: Min A to stablize into standing. Cues for safe hand placement and technique  Ambulation/Gait Ambulation/Gait assistance: Min assist Ambulation Distance (Feet): 10 Feet Assistive device: Rolling walker (2 wheeled) Gait Pattern/deviations: Step-to pattern;Decreased stance time - right;Decreased step length - left Gait velocity: decreased Gait velocity interpretation: Below normal speed for age/gender General Gait Details: Cues for weight through RW and positioning of RW especially with turns. Limited due to pain   Stairs            Wheelchair Mobility    Modified Rankin (Stroke Patients Only)       Balance                                    Cognition Arousal/Alertness: Awake/alert Behavior During Therapy: Anxious Overall Cognitive Status: Within Functional Limits for tasks assessed                      Exercises      General Comments        Pertinent Vitals/Pain Pain Score: 7  Pain Location: pelvic region Pain Descriptors / Indicators: Aching;Sore;Spasm Pain Intervention(s): Monitored during session;Repositioned    Home Living                      Prior Function            PT Goals (current goals can now be found in the care plan section)  Progress towards PT goals: Progressing toward goals    Frequency  Min 5X/week    PT Plan Current plan remains appropriate    Co-evaluation             End of Session Equipment Utilized During Treatment: Gait belt Activity Tolerance: Patient limited by pain Patient left: in chair;with call bell/phone within reach     Time: [id number] PT Time Calculation (min) (ACUTE ONLY): 19 min  Charges:  $Gait Training: 8-22 mins                    G Codes:      Jacqualyn Posey 12/15/2014, 1:48 PM 12/15/2014 Jacqualyn Posey PTA [phone] pager [phone] office

## 2014-12-15 NOTE — Progress Notes (Signed)
[facility] PROGRESS NOTE  [name] [id number] DOB: Nov 18, 1970 DOA: 12/11/2014 PCP: Leonard Downing, MD  Brief history 44 year old female with a history of hypertension, hyperlipidemia, anxiety, PTSD presented with a fall landing on her buttocks and her back. In the past 24 hours, the patient also began having numerous episodes of bilious vomiting and abdominal pain. She denies any fevers, chills, diarrhea, hematochezia, melena. There is been no recent sick contacts or she has not had any unusual travel or eating any raw or undercooked foods. At the time of presentation, the patient was noted to be tachycardic with heart rate 130-140 with a WBC 17.0 and lactic acid 4.27. The patient was treated for SIRS--fluids, intravenous antibiotics were started. CT of the lumbar spine revealed nondisplaced right sacral fracture. The patient states that 2-3 days prior to admission, she went to see her primary care physician who started her on acyclovir for shingles. Since admission, the patient was converted to intravenous acyclovir due to her vomiting and inability to tolerate oral medications   Assessment/Plan: #1 tachycardia/lactic acidosis Unknown etiology. Chest x-ray is negative for any acute infiltrates. Urinalysis was nitrite negative leukocytes negative. Blood cultures were no growth to date. Patient is afebrile. WBC is trending down. Lactic acid level has trended down. Heart rate improving with adjusted doses of metoprolol and IV fluids. 2-D echo with EF of 55-60% with no wall motion abnormalities. TSH within normal limits at 1.464. Patient has been off antibiotics for the past 24 hours. Follow.  #2 nausea/vomiting/abdominal pain Clinical improvement. Patient currently tolerating a soft diet. CT abdomen and pelvis was negative for any acute intra-abdominal abnormalities except a distended urinary bladder which required intermittent catheterizations. Patient able to urinate  spontaneously. 12/13/2014 vomiting improved patient wanted to eat patient started on full liquids. 12/14/2014 patient improved clinically patient's diet advanced to a soft diet. Continue oral metoprolol. Change IV pain medications to oral pain medications. Patient currently on oral Valium home dose.  #3 herpes zoster dermatitis Change IV acyclovir to oral acyclovir on discharge to complete a one-week course.  #4 nondisplaced sacral fracture PT/OT. Dr. Carles Collet discussed with Dr. [name] of orthopedics who recommended weightbearing as tolerated with outpatient follow-up.  #5 anxiety/PTSD Continue oral Valium. Patient was prescribed Cymbalta 30 mg daily as per her PCP which is not starting and may be resumed on discharge.  #6 hypertension Metoprolol. Patient stopped taking Norvasc secondary to financial issues.  #7 sinus tachycardia Likely secondary to acute issues. TSH within normal limits at 1.464. 2-D echo with EF of 55-60% with no wall abnormalities. Patient with no signs or symptoms of infection. Increase metoprolol to 75 mg twice daily. Outpatient follow-up.  #8 prophylaxis SCDs for DVT prophylaxis.  Code Status: Full Family Communication: Updated patient. No family present. Disposition Plan: Home when heart rate has improved and patient has been assessed by PT hopefully 1-2 days.   Consultants:  None  Procedures:  CT abdomen and pelvis 12/12/2014  CT of the L-spine 12/11/2014  Chest x-ray 12/12/2014, 12/13/2014  2-D echo 12/13/2014  Antibiotics:  IV Zosyn 12/12/2014>>>> 12/14/2014  IV vancomycin 12/12/2014>>>>> 12/14/2014  HPI/Subjective: Patient states pain is controlled. Patient states was able to sit up in bed yesterday. Patient denies any chest pain. No shortness of breath.  Objective: Filed Vitals:   12/15/14 0555  BP: 127/97  Pulse: 124  Temp: 98.4 F (36.9 C)  Resp: 18    Intake/Output Summary (Last 24 hours) at 12/15/14 1033 Last data filed at  12/15/14 [phone]  Gross per 24 hour  Intake 1517.5 ml  Output    900 ml  Net  617.5 ml   Filed Weights   12/11/14 1829 12/12/14 0400  Weight: 74.844 kg (165 lb) 79.5 kg (175 lb 4.3 oz)    Exam:   General:  NAD  Cardiovascular: RRR  Respiratory: CTAB  Abdomen: Soft, nontender, nondistended, positive bowel sounds.  Musculoskeletal: No clubbing cyanosis or edema.  Data Reviewed: Basic Metabolic Panel:  Recent Labs Lab 12/11/14 2334 12/12/14 0740 12/13/14 0255 12/14/14 0251 12/15/14 0513  NA 138 135 136 137 135  K 3.9 4.1 3.9 3.8 3.7  CL 104 104 98* 102 100*  CO2 16* 18* 26 27 24   GLUCOSE 102* 129* 134* 108* 118*  BUN 10 <5* <5* <5* <5*  CREATININE 0.73 0.63 0.56 0.50 0.53  CALCIUM 8.9 7.9* 9.0 8.9 8.3*   Liver Function Tests:  Recent Labs Lab 12/11/14 2334 12/13/14 0255  AST 46* 36  ALT 27 31  ALKPHOS 93 85  BILITOT 0.7 0.9  PROT 7.8 8.0  ALBUMIN 4.2 3.9    Recent Labs Lab 12/11/14 2334  LIPASE 12*   No results for input(s): AMMONIA in the last 168 hours. CBC:  Recent Labs Lab 12/12/14 0035 12/12/14 0740 12/13/14 0255 12/15/14 0513  WBC 17.0* 14.7* 12.7* 10.3  NEUTROABS 13.5*  --   --   --   HGB 13.5 12.1 13.9 11.4*  HCT 39.2 35.5* 40.4 35.1*  MCV 90.1 90.6 89.8 93.4  PLT 269 233 278 290   Cardiac Enzymes: No results for input(s): CKTOTAL, CKMB, CKMBINDEX, TROPONINI in the last 168 hours. BNP (last 3 results) No results for input(s): BNP in the last 8760 hours.  ProBNP (last 3 results) No results for input(s): PROBNP in the last 8760 hours.  CBG: No results for input(s): GLUCAP in the last 168 hours.  Recent Results (from the past 240 hour(s))  Culture, blood (routine x 2)     Status: None (Preliminary result)   Collection Time: 12/12/14 12:25 AM  Result Value Ref Range Status   Specimen Description BLOOD LEFT ARM  Final   Special Requests BOTTLES DRAWN AEROBIC AND ANAEROBIC 5CC   Final   Culture NO GROWTH 2 DAYS  Final    Report Status PENDING  Incomplete  Culture, blood (routine x 2)     Status: None (Preliminary result)   Collection Time: 12/12/14 12:35 AM  Result Value Ref Range Status   Specimen Description BLOOD LEFT HAND  Final   Special Requests BOTTLES DRAWN AEROBIC ONLY 5CC  Final   Culture NO GROWTH 2 DAYS  Final   Report Status PENDING  Incomplete  MRSA PCR Screening     Status: None   Collection Time: 12/12/14  3:28 AM  Result Value Ref Range Status   MRSA by PCR NEGATIVE NEGATIVE Final    Comment:        The GeneXpert MRSA Assay (FDA approved for NASAL specimens only), is one component of a comprehensive MRSA colonization surveillance program. It is not intended to diagnose MRSA infection nor to guide or monitor treatment for MRSA infections.      Studies: Dg Chest Port 1 View  12/13/2014   CLINICAL DATA:  Shortness of breath, recent fall with back pain  EXAM: PORTABLE CHEST - 1 VIEW  COMPARISON:  12/12/2014  FINDINGS: The heart size and mediastinal contours are within normal limits. Both lungs are clear. The visualized skeletal structures are unremarkable.  IMPRESSION: No  active disease.   Electronically Signed   By: Inez Catalina M.D.   On: 12/13/2014 12:52    Scheduled Meds: . acyclovir  10 mg/kg (Ideal) Intravenous 3 times per day  . antiseptic oral rinse  7 mL Mouth Rinse BID  . diazepam  5 mg Oral Q6H  . metoCLOPramide  5 mg Oral TID AC  . metoprolol tartrate  75 mg Oral BID   Continuous Infusions: . 0.9 % NaCl with KCl 20 mEq / L 125 mL/hr at 12/15/14 [id number]    Principal Problem:   Sacral fracture Active Problems:   Sepsis   Sinus tachycardia   Lactic acidosis   Intractable abdominal pain   Fall   Nausea and vomiting   Leukocytosis   Chronic lower back pain   Shingles outbreak   Nausea with vomiting    Time spent: 42 minutes    THOMPSON,DANIEL M.D. [facility] Pager [phone]. If 7PM-7AM, please contact night-coverage at [email or web address], password  [facility] 12/15/2014, 10:33 AM  LOS: 3 days

## 2014-12-15 NOTE — Care Management Note (Signed)
Case Management Note  Patient Details  Name: [name] MRN: [id number] Date of Birth: 1970-06-16  Subjective/Objective:          Admitted with sacral fracture           Action/Plan: PT recommended HHPT,wheelchair with cushion and rollator. Spoke with patient and explained that since she does not have insurance, Medicaid guidelines apply and Medicaid does not cover home therapy for her diagnosis. Explained that I would contact [facility] to provide a wheelchair and rollator. Patient states that she lives with her husband and that he will be able to assist her after discharge. Made referral to financial counselor. Will continue to follow.     Expected Discharge Date:                  Expected Discharge Plan:  Home/Self Care  In-House Referral:  Financial Counselor  Discharge planning Services  CM Consult  Post Acute Care Choice:  Durable Medical Equipment Choice offered to:  NA  DME Arranged:  Wheelchair manual, Walker rolling with seat DME Agency:  [place]:    [place] Agency:     Status of Service:  In process, will continue to follow  Medicare Important Message Given:    Date Medicare IM Given:    Medicare IM give by:    Date Additional Medicare IM Given:    Additional Medicare Important Message give by:     If discussed at [place] of Stay Meetings, dates discussed:    Additional Comments:  Nila Nephew, RN 12/15/2014, 1:52 PM

## 2014-12-16 ENCOUNTER — Inpatient Hospital Stay (HOSPITAL_COMMUNITY): Payer: Medicaid Other

## 2014-12-16 DIAGNOSIS — I959 Hypotension, unspecified: Secondary | ICD-10-CM | POA: Diagnosis not present

## 2014-12-16 DIAGNOSIS — D72829 Elevated white blood cell count, unspecified: Secondary | ICD-10-CM

## 2014-12-16 DIAGNOSIS — S3210XD Unspecified fracture of sacrum, subsequent encounter for fracture with routine healing: Secondary | ICD-10-CM

## 2014-12-16 LAB — BASIC METABOLIC PANEL
ANION GAP: 8 (ref 5–15)
BUN: 6 mg/dL (ref 6–20)
CHLORIDE: 106 mmol/L (ref 101–111)
CO2: 23 mmol/L (ref 22–32)
Calcium: 7.8 mg/dL — ABNORMAL LOW (ref 8.9–10.3)
Creatinine, Ser: 0.52 mg/dL (ref 0.44–1.00)
GFR calc non Af Amer: >60 mL/min (ref >60)
GLUCOSE: 95 mg/dL (ref 65–99)
POTASSIUM: 4 mmol/L (ref 3.5–5.1)
Sodium: 137 mmol/L (ref 135–145)

## 2014-12-16 LAB — CBC
HEMATOCRIT: 28.9 % — AB (ref 36.0–46.0)
HEMOGLOBIN: 9.6 g/dL — AB (ref 12.0–15.0)
MCH: 30.7 pg (ref 26.0–34.0)
MCHC: 33.2 g/dL (ref 30.0–36.0)
MCV: 92.3 fL (ref 78.0–100.0)
Platelets: 221 10*3/uL (ref 150–400)
RBC: 3.13 MIL/uL — AB (ref 3.87–5.11)
RDW: 14.1 % (ref 11.5–15.5)
WBC: 8.3 10*3/uL (ref 4.0–10.5)

## 2014-12-16 LAB — IRON AND TIBC
IRON: 37 ug/dL (ref 28–170)
SATURATION RATIOS: 14 % (ref 10.4–31.8)
TIBC: 259 ug/dL (ref 250–450)
UIBC: 222 ug/dL

## 2014-12-16 LAB — URINALYSIS, ROUTINE W REFLEX MICROSCOPIC
BILIRUBIN URINE: NEGATIVE
GLUCOSE, UA: NEGATIVE mg/dL
Hgb urine dipstick: NEGATIVE
KETONES UR: 15 mg/dL — AB
Leukocytes, UA: NEGATIVE
Nitrite: NEGATIVE
PH: 5.5 (ref 5.0–8.0)
Protein, ur: NEGATIVE mg/dL
SPECIFIC GRAVITY, URINE: 1.005 (ref 1.005–1.030)
Urobilinogen, UA: 0.2 mg/dL (ref 0.0–1.0)

## 2014-12-16 LAB — FOLATE: FOLATE: 18.9 ng/mL (ref >5.9)

## 2014-12-16 LAB — VITAMIN B12: Vitamin B-12: 392 pg/mL (ref 180–914)

## 2014-12-16 LAB — FERRITIN: FERRITIN: 54 ng/mL (ref 11–307)

## 2014-12-16 MED ORDER — SODIUM CHLORIDE 0.9 % IV BOLUS (SEPSIS)
500.0000 mL | Freq: Once | INTRAVENOUS | Status: AC
  Administered 2014-12-16: 500 mL via INTRAVENOUS

## 2014-12-16 MED ORDER — SODIUM CHLORIDE 0.9 % IV SOLN: INTRAVENOUS | Status: DC

## 2014-12-16 MED ORDER — SODIUM CHLORIDE 0.9 % IV SOLN
INTRAVENOUS | Status: DC
  Administered 2014-12-18: 75 mL/h via INTRAVENOUS

## 2014-12-16 NOTE — Progress Notes (Signed)
BP per dynamap 82/52 manual 100/70 Dr Grandville Silos called started NS@75hr 

## 2014-12-16 NOTE — Progress Notes (Signed)
Patient was hypotensive last night (68/38, 82/62, 86/64). Patient was given 3 boluses (1000 ccs, 1000 ccs, 500 ccs). Rapid Response RN, Brooke ordered RN to give no PRN pain medications. Patients last blood pressure 108/70. Nursing will continue to monitor.

## 2014-12-16 NOTE — Progress Notes (Signed)
[facility] PROGRESS NOTE  [name] [id number] DOB: Oct 13, 1970 DOA: 12/11/2014 PCP: Leonard Downing, MD  Brief history 44 year old female with a history of hypertension, hyperlipidemia, anxiety, PTSD presented with a fall landing on her buttocks and her back. In the past 24 hours, the patient also began having numerous episodes of bilious vomiting and abdominal pain. She denies any fevers, chills, diarrhea, hematochezia, melena. There is been no recent sick contacts or she has not had any unusual travel or eating any raw or undercooked foods. At the time of presentation, the patient was noted to be tachycardic with heart rate 130-140 with a WBC 17.0 and lactic acid 4.27. The patient was treated for SIRS--fluids, intravenous antibiotics were started. CT of the lumbar spine revealed nondisplaced right sacral fracture. The patient states that 2-3 days prior to admission, she went to see her primary care physician who started her on acyclovir for shingles. Since admission, the patient was converted to intravenous acyclovir due to her vomiting and inability to tolerate oral medications   Assessment/Plan: #1 tachycardia/lactic acidosis Unknown etiology. Chest x-ray is negative for any acute infiltrates. Urinalysis was nitrite negative leukocytes negative. Blood cultures were no growth to date. Patient is afebrile. WBC is trending down. Lactic acid level has trended down. Heart rate improving with adjusted doses of metoprolol and IV fluids. 2-D echo with EF of 55-60% with no wall motion abnormalities. TSH within normal limits at 1.464. Patient has been off antibiotics for the past 24 hours. Patient noted to be hypotensive overnight. Will panculture. Follow.  #2 nausea/vomiting/abdominal pain Clinical improvement. Patient currently tolerating a soft diet. CT abdomen and pelvis was negative for any acute intra-abdominal abnormalities except a distended urinary bladder which required  intermittent catheterizations. Patient able to urinate spontaneously. 12/13/2014 vomiting improved patient wanted to eat patient started on full liquids. 12/14/2014 patient improved clinically patient's diet advanced to a soft diet. Continue oral pain medications. Continue oral home was Valium.  #3 hypotension Questionable etiology. May be secondary to hypovolemia. IV pain medications have been discontinued. We'll cycle cardiac enzymes every 6 hours 3. Check a cortisol level. Check a UA with cultures and sensitivities. Check a chest x-ray. Repeat blood cultures. Check a lactic acid level. Place on IV fluids. Discontinue metoprolol. Follow.  #4 herpes zoster dermatitis Change IV acyclovir to oral acyclovir on discharge to complete a one-week course.  #5 nondisplaced sacral fracture PT/OT. Dr. Carles Collet discussed with Dr. [name] of orthopedics who recommended weightbearing as tolerated with outpatient follow-up.  #6 anxiety/PTSD Continue oral Valium. Patient was prescribed Cymbalta 30 mg daily as per her PCP which is not starting and may be resumed on discharge.  #7 hypertension Patient was noted to be hypotensive overnight. Discontinue metoprolol.   #8 sinus tachycardia Likely secondary to acute issues. TSH within normal limits at 1.464. 2-D echo with EF of 55-60% with no wall abnormalities. Patient with no signs or symptoms of infection. Patient noted to be hypotensive overnight. Heart rate has improved. Will discontinue beta blocker for now.   #9 prophylaxis SCDs for DVT prophylaxis.  Code Status: Full Family Communication: Updated patient. No family present. Disposition Plan: Home when heart rate has improved and patient has been assessed by PT, and hypotension resolved.    Consultants:  None  Procedures:  CT abdomen and pelvis 12/12/2014  CT of the L-spine 12/11/2014  Chest x-ray 12/12/2014, 12/13/2014  2-D echo 12/13/2014  Antibiotics:  IV Zosyn 12/12/2014>>>>  12/14/2014  IV vancomycin 12/12/2014>>>>> 12/14/2014  HPI/Subjective: Patient  states pain is controlled. Patient states was able to sit up in bed yesterday. Patient denies any chest pain. No shortness of breath. Patient noted to be hypotensive overnight responded to 3 L of normal saline. Patient currently asymptomatic.  Objective: Filed Vitals:   12/16/14 0503  BP: 108/71  Pulse: 98  Temp: 98.2 F (36.8 C)  Resp: 16    Intake/Output Summary (Last 24 hours) at 12/16/14 1156 Last data filed at 12/16/14 0504  Gross per 24 hour  Intake    880 ml  Output      0 ml  Net    880 ml   Filed Weights   12/11/14 1829 12/12/14 0400  Weight: 74.844 kg (165 lb) 79.5 kg (175 lb 4.3 oz)    Exam:   General:  NAD  Cardiovascular: RRR  Respiratory: CTAB  Abdomen: Soft, nontender, nondistended, positive bowel sounds.  Musculoskeletal: No clubbing cyanosis or edema.  Data Reviewed: Basic Metabolic Panel:  Recent Labs Lab 12/12/14 0740 12/13/14 0255 12/14/14 0251 12/15/14 0513 12/16/14 0536  NA 135 136 137 135 137  K 4.1 3.9 3.8 3.7 4.0  CL 104 98* 102 100* 106  CO2 18* 26 27 24 23   GLUCOSE 129* 134* 108* 118* 95  BUN <5* <5* <5* <5* 6  CREATININE 0.63 0.56 0.50 0.53 0.52  CALCIUM 7.9* 9.0 8.9 8.3* 7.8*   Liver Function Tests:  Recent Labs Lab 12/11/14 2334 12/13/14 0255  AST 46* 36  ALT 27 31  ALKPHOS 93 85  BILITOT 0.7 0.9  PROT 7.8 8.0  ALBUMIN 4.2 3.9    Recent Labs Lab 12/11/14 2334  LIPASE 12*   No results for input(s): AMMONIA in the last 168 hours. CBC:  Recent Labs Lab 12/12/14 0035 12/12/14 0740 12/13/14 0255 12/15/14 0513 12/16/14 0536  WBC 17.0* 14.7* 12.7* 10.3 8.3  NEUTROABS 13.5*  --   --   --   --   HGB 13.5 12.1 13.9 11.4* 9.6*  HCT 39.2 35.5* 40.4 35.1* 28.9*  MCV 90.1 90.6 89.8 93.4 92.3  PLT 269 233 278 290 221   Cardiac Enzymes: No results for input(s): CKTOTAL, CKMB, CKMBINDEX, TROPONINI in the last 168 hours. BNP (last  3 results) No results for input(s): BNP in the last 8760 hours.  ProBNP (last 3 results) No results for input(s): PROBNP in the last 8760 hours.  CBG: No results for input(s): GLUCAP in the last 168 hours.  Recent Results (from the past 240 hour(s))  Culture, blood (routine x 2)     Status: None (Preliminary result)   Collection Time: 12/12/14 12:25 AM  Result Value Ref Range Status   Specimen Description BLOOD LEFT ARM  Final   Special Requests BOTTLES DRAWN AEROBIC AND ANAEROBIC 5CC   Final   Culture NO GROWTH 3 DAYS  Final   Report Status PENDING  Incomplete  Culture, blood (routine x 2)     Status: None (Preliminary result)   Collection Time: 12/12/14 12:35 AM  Result Value Ref Range Status   Specimen Description BLOOD LEFT HAND  Final   Special Requests BOTTLES DRAWN AEROBIC ONLY 5CC  Final   Culture NO GROWTH 3 DAYS  Final   Report Status PENDING  Incomplete  MRSA PCR Screening     Status: None   Collection Time: 12/12/14  3:28 AM  Result Value Ref Range Status   MRSA by PCR NEGATIVE NEGATIVE Final    Comment:        The GeneXpert  MRSA Assay (FDA approved for NASAL specimens only), is one component of a comprehensive MRSA colonization surveillance program. It is not intended to diagnose MRSA infection nor to guide or monitor treatment for MRSA infections.      Studies: No results found.  Scheduled Meds: . acyclovir  10 mg/kg (Ideal) Intravenous 3 times per day  . antiseptic oral rinse  7 mL Mouth Rinse BID  . diazepam  5 mg Oral Q6H  . metoCLOPramide  5 mg Oral TID AC   Continuous Infusions:    Principal Problem:   Sacral fracture Active Problems:   Sepsis   Sinus tachycardia   Lactic acidosis   Intractable abdominal pain   Fall   Nausea and vomiting   Leukocytosis   Chronic lower back pain   Shingles outbreak   Nausea with vomiting   Herpes simplex virus type 1 (HSV-1) dermatitis   Hypotension    Time spent: 55  minutes    THOMPSON,DANIEL M.D. [facility] Pager [phone]. If 7PM-7AM, please contact night-coverage at [email or web address], password [facility] 12/16/2014, 11:56 AM  LOS: 4 days

## 2014-12-16 NOTE — Progress Notes (Signed)
PT Cancellation Note  Patient Details Name: [name] MRN: [id number] DOB: [birth year]   Cancelled Treatment:    Reason Eval/Treat Not Completed: Fatigue/lethargy limiting ability to participate. Patient stated that she had just gotten back into bed and didn't feel up to therapy. Patient falling asleep as I was talking with her husband. Patient will have assistance from her husband as needed at home. Will attempt to see in AM tomorrow.    Jacqualyn Posey 12/16/2014, 1:25 PM

## 2014-12-17 DIAGNOSIS — F329 Major depressive disorder, single episode, unspecified: Secondary | ICD-10-CM | POA: Diagnosis present

## 2014-12-17 DIAGNOSIS — F418 Other specified anxiety disorders: Secondary | ICD-10-CM | POA: Diagnosis present

## 2014-12-17 DIAGNOSIS — F32A Depression, unspecified: Secondary | ICD-10-CM | POA: Diagnosis present

## 2014-12-17 LAB — CBC WITH DIFFERENTIAL/PLATELET
BASOS ABS: 0 10*3/uL (ref 0.0–0.1)
BASOS PCT: 0 % (ref 0–1)
Eosinophils Absolute: 0.3 10*3/uL (ref 0.0–0.7)
Eosinophils Relative: 3 % (ref 0–5)
HEMATOCRIT: 30.3 % — AB (ref 36.0–46.0)
Hemoglobin: 10.1 g/dL — ABNORMAL LOW (ref 12.0–15.0)
LYMPHS PCT: 14 % (ref 12–46)
Lymphs Abs: 1.4 10*3/uL (ref 0.7–4.0)
MCH: 31 pg (ref 26.0–34.0)
MCHC: 33.3 g/dL (ref 30.0–36.0)
MCV: 92.9 fL (ref 78.0–100.0)
Monocytes Absolute: 0.5 10*3/uL (ref 0.1–1.0)
Monocytes Relative: 5 % (ref 3–12)
NEUTROS ABS: 7.8 10*3/uL — AB (ref 1.7–7.7)
NEUTROS PCT: 78 % — AB (ref 43–77)
Platelets: 249 10*3/uL (ref 150–400)
RBC: 3.26 MIL/uL — AB (ref 3.87–5.11)
RDW: 13.7 % (ref 11.5–15.5)
WBC: 10 10*3/uL (ref 4.0–10.5)

## 2014-12-17 LAB — BASIC METABOLIC PANEL
ANION GAP: 8 (ref 5–15)
ANION GAP: 6 (ref 5–15)
BUN: <5 mg/dL — ABNORMAL LOW (ref 6–20)
BUN: <5 mg/dL — ABNORMAL LOW (ref 6–20)
CALCIUM: 8.2 mg/dL — AB (ref 8.9–10.3)
CALCIUM: 7.8 mg/dL — AB (ref 8.9–10.3)
CO2: 26 mmol/L (ref 22–32)
CO2: 26 mmol/L (ref 22–32)
Chloride: 105 mmol/L (ref 101–111)
Chloride: 103 mmol/L (ref 101–111)
Creatinine, Ser: 0.5 mg/dL (ref 0.44–1.00)
Creatinine, Ser: 0.56 mg/dL (ref 0.44–1.00)
GLUCOSE: 112 mg/dL — AB (ref 65–99)
GLUCOSE: 119 mg/dL — AB (ref 65–99)
POTASSIUM: 3.4 mmol/L — AB (ref 3.5–5.1)
POTASSIUM: 3.6 mmol/L (ref 3.5–5.1)
Sodium: 139 mmol/L (ref 135–145)
Sodium: 135 mmol/L (ref 135–145)

## 2014-12-17 LAB — CULTURE, BLOOD (ROUTINE X 2)
Culture: NO GROWTH
Culture: NO GROWTH

## 2014-12-17 LAB — LACTIC ACID, PLASMA: LACTIC ACID, VENOUS: 0.8 mmol/L (ref 0.5–2.0)

## 2014-12-17 LAB — URINE CULTURE

## 2014-12-17 LAB — CBC
HCT: 27.6 % — ABNORMAL LOW (ref 36.0–46.0)
Hemoglobin: 9 g/dL — ABNORMAL LOW (ref 12.0–15.0)
MCH: 30.1 pg (ref 26.0–34.0)
MCHC: 32.6 g/dL (ref 30.0–36.0)
MCV: 92.3 fL (ref 78.0–100.0)
PLATELETS: 236 10*3/uL (ref 150–400)
RBC: 2.99 MIL/uL — AB (ref 3.87–5.11)
RDW: 13.8 % (ref 11.5–15.5)
WBC: 9.4 10*3/uL (ref 4.0–10.5)

## 2014-12-17 LAB — TROPONIN I
Troponin I: <0.03 ng/mL (ref <0.031)
Troponin I: <0.03 ng/mL (ref <0.031)

## 2014-12-17 LAB — CORTISOL
CORTISOL PLASMA: 5.3 ug/dL
Cortisol, Plasma: 5.4 ug/dL

## 2014-12-17 NOTE — Progress Notes (Signed)
[facility] PROGRESS NOTE  [name] [id number] DOB: [birth year] DOA: 12/11/2014 PCP: Leonard Downing, MD  Brief history 44 year old female with a history of hypertension, hyperlipidemia, anxiety, PTSD presented with a fall landing on her buttocks and her back. In the past 24 hours, the patient also began having numerous episodes of bilious vomiting and abdominal pain. She denies any fevers, chills, diarrhea, hematochezia, melena. There is been no recent sick contacts or she has not had any unusual travel or eating any raw or undercooked foods. At the time of presentation, the patient was noted to be tachycardic with heart rate 130-140 with a WBC 17.0 and lactic acid 4.27. The patient was treated for SIRS--fluids, intravenous antibiotics were started. CT of the lumbar spine revealed nondisplaced right sacral fracture. The patient states that 2-3 days prior to admission, she went to see her primary care physician who started her on acyclovir for shingles. Since admission, the patient was converted to intravenous acyclovir due to her vomiting and inability to tolerate oral medications   Assessment/Plan: #1 tachycardia/lactic acidosis Unknown etiology. Chest x-ray is negative for any acute infiltrates. Urinalysis was nitrite negative leukocytes negative. Blood cultures were no growth to date. Patient is afebrile. WBC is trending down. Lactic acid level has trended down. Heart rate improving with adjusted doses of metoprolol and IV fluids. 2-D echo with EF of 55-60% with no wall motion abnormalities. TSH within normal limits at 1.464. Patient has been off antibiotics for the past 24 hours. Patient noted to be hypotensive overnight. Cultures were no growth to date. Hypotension may be secondary to drug medications as patient was noted to have her own home medications found in the bed. Follow.  #2 nausea/vomiting/abdominal pain Clinical improvement. Patient currently tolerating a soft  diet. CT abdomen and pelvis was negative for any acute intra-abdominal abnormalities except a distended urinary bladder which required intermittent catheterizations. Patient able to urinate spontaneously. 12/13/2014 vomiting improved patient wanted to eat patient started on full liquids. 12/14/2014 patient improved clinically patient's diet advanced to a soft diet. Continue oral pain medications. Continue oral home was Valium.  #3 hypotension Questionable etiology. May be secondary to drug-induced as medication pills were found under the patient which were likely her home regimen per nursing versus hypovolemia. IV pain medications have been discontinued. Cardiac enzymes minimally elevated with no acute abnormalities. Cortisol levels of 5.4. Urinalysis was nitrite negative, leukocytes negative. Blood cultures are preparing a pending. Lactic acid level is within normal limits. Chest x-ray with no acute infiltrate. Nursing has been instructed to remove all patient's home medications from the room and sent to the pharmacy. Continue fluid resuscitation. Discontinued metoprolol. Follow.  #4 herpes zoster dermatitis Change IV acyclovir to oral acyclovir on discharge to complete a one-week course.  #5 nondisplaced sacral fracture PT/OT. Dr. Carles Collet discussed with Dr. [name] of orthopedics who recommended weightbearing as tolerated with outpatient follow-up.  #6 anxiety/PTSD/depression Continue oral Valium. Patient was prescribed Cymbalta 30 mg daily as per her PCP which is not starting and may be resumed on discharge. Per husband patient noted to be depressed and usually just lays around in bed. Patient has been to [facility] once per husband and scheduled to go there next week. Patient does have a flat affect. Will consult psychiatry for further evaluation and management. May need to start patient on Cymbalta but will defer to psychiatry.  #7 hypertension Patient was noted to be hypotensive overnight. Discontinued  metoprolol.   #8 sinus tachycardia Likely secondary to acute issues  versus possible withdrawal from muscle relaxants or oral pain medications. TSH within normal limits at 1.464. 2-D echo with EF of 55-60% with no wall abnormalities. Patient with no signs or symptoms of infection. Patient noted to be hypotensive overnight. Heart rate has improved with hydration. Will discontinue beta blocker secondary to hypotension. Will discontinue beta blocker for now.   #9 prophylaxis SCDs for DVT prophylaxis.  Code Status: Full Family Communication: Updated patient and husband at bedside.. Disposition Plan: Home when heart rate has improved and hypotension has resolved.    Consultants:  None  Procedures:  CT abdomen and pelvis 12/12/2014  CT of the L-spine 12/11/2014  Chest x-ray 12/12/2014, 12/13/2014  2-D echo 12/13/2014  Antibiotics:  IV Zosyn 12/12/2014>>>> 12/14/2014  IV vancomycin 12/12/2014>>>>> 12/14/2014  HPI/Subjective: Patient sleeping. Events overnight and early this morning noted of patient's hypotension. Blood pressure with some improvement with fluids. Per nursing patient also noted to have pills in her bed which were likely her home regimen of pills.  Objective: Filed Vitals:   12/17/14 0642  BP: 90/56  Pulse: 63  Temp: 97.7 F (36.5 C)  Resp: 16   No intake or output data in the 24 hours ending 12/17/14 1353 Filed Weights   12/11/14 1829 12/12/14 0400  Weight: 74.844 kg (165 lb) 79.5 kg (175 lb 4.3 oz)    Exam:   General:  Patient is sleeping.  Cardiovascular: RRR  Respiratory: CTAB  Abdomen: Soft, nontender, nondistended, positive bowel sounds.  Musculoskeletal: No clubbing cyanosis or edema.  Data Reviewed: Basic Metabolic Panel:  Recent Labs Lab 12/14/14 0251 12/15/14 0513 12/16/14 0536 12/16/14 2255 12/17/14 0526  NA 137 135 137 139 135  K 3.8 3.7 4.0 3.4* 3.6  CL 102 100* 106 105 103  CO2 27 24 23 26 26   GLUCOSE 108* 118* 95 112*  119*  BUN <5* <5* 6 <5* <5*  CREATININE 0.50 0.53 0.52 0.50 0.56  CALCIUM 8.9 8.3* 7.8* 8.2* 7.8*   Liver Function Tests:  Recent Labs Lab 12/11/14 2334 12/13/14 0255  AST 46* 36  ALT 27 31  ALKPHOS 93 85  BILITOT 0.7 0.9  PROT 7.8 8.0  ALBUMIN 4.2 3.9    Recent Labs Lab 12/11/14 2334  LIPASE 12*   No results for input(s): AMMONIA in the last 168 hours. CBC:  Recent Labs Lab 12/12/14 0035  12/13/14 0255 12/15/14 0513 12/16/14 0536 12/16/14 2255 12/17/14 0526  WBC 17.0*  < > 12.7* 10.3 8.3 10.0 9.4  NEUTROABS 13.5*  --   --   --   --  7.8*  --   HGB 13.5  < > 13.9 11.4* 9.6* 10.1* 9.0*  HCT 39.2  < > 40.4 35.1* 28.9* 30.3* 27.6*  MCV 90.1  < > 89.8 93.4 92.3 92.9 92.3  PLT 269  < > 278 290 221 249 236  < > = values in this interval not displayed. Cardiac Enzymes:  Recent Labs Lab 12/16/14 2255 12/17/14 0107 12/17/14 0526  TROPONINI <0.03 <0.03 <0.03   BNP (last 3 results) No results for input(s): BNP in the last 8760 hours.  ProBNP (last 3 results) No results for input(s): PROBNP in the last 8760 hours.  CBG: No results for input(s): GLUCAP in the last 168 hours.  Recent Results (from the past 240 hour(s))  Culture, blood (routine x 2)     Status: None (Preliminary result)   Collection Time: 12/12/14 12:25 AM  Result Value Ref Range Status   Specimen Description BLOOD  LEFT ARM  Final   Special Requests BOTTLES DRAWN AEROBIC AND ANAEROBIC 5CC   Final   Culture NO GROWTH 4 DAYS  Final   Report Status PENDING  Incomplete  Culture, blood (routine x 2)     Status: None (Preliminary result)   Collection Time: 12/12/14 12:35 AM  Result Value Ref Range Status   Specimen Description BLOOD LEFT HAND  Final   Special Requests BOTTLES DRAWN AEROBIC ONLY 5CC  Final   Culture NO GROWTH 4 DAYS  Final   Report Status PENDING  Incomplete  MRSA PCR Screening     Status: None   Collection Time: 12/12/14  3:28 AM  Result Value Ref Range Status   MRSA by PCR  NEGATIVE NEGATIVE Final    Comment:        The GeneXpert MRSA Assay (FDA approved for NASAL specimens only), is one component of a comprehensive MRSA colonization surveillance program. It is not intended to diagnose MRSA infection nor to guide or monitor treatment for MRSA infections.   Culture, Urine     Status: None (Preliminary result)   Collection Time: 12/16/14 10:40 PM  Result Value Ref Range Status   Specimen Description URINE, CLEAN CATCH  Final   Special Requests NONE  Final   Culture NO GROWTH < 12 HOURS  Final   Report Status PENDING  Incomplete     Studies: Dg Chest Port 1 View  12/16/2014   CLINICAL DATA:  Hypotension ; history hypertension  EXAM: PORTABLE CHEST - 1 VIEW  COMPARISON:  Portable exam 1827 hours compared to 12/13/2014  FINDINGS: Upper normal heart size.  Normal mediastinal contours and pulmonary vascularity  RIGHT basilar subsegmental atelectasis.  Questionable retrocardiac LEFT lower lobe infiltrate.  Remaining lungs clear.  No pleural effusion or pneumothorax.  IMPRESSION: Subsegmental atelectasis RIGHT base with question atelectasis versus infiltrate in retrocardiac LEFT lower lobe.   Electronically Signed   By: Lavonia Dana M.D.   On: 12/16/2014 18:39    Scheduled Meds: . acyclovir  10 mg/kg (Ideal) Intravenous 3 times per day  . antiseptic oral rinse  7 mL Mouth Rinse BID  . diazepam  5 mg Oral Q6H  . metoCLOPramide  5 mg Oral TID AC   Continuous Infusions: . sodium chloride      Principal Problem:   Sacral fracture Active Problems:   Sepsis   Sinus tachycardia   Lactic acidosis   Intractable abdominal pain   Fall   Nausea and vomiting   Leukocytosis   Chronic lower back pain   Shingles outbreak   Nausea with vomiting   Herpes simplex virus type 1 (HSV-1) dermatitis   Hypotension   Depression    Time spent: 36 minutes    THOMPSON,DANIEL M.D. [facility] Pager [phone]. If 7PM-7AM, please contact night-coverage at  [email or web address], password [facility] 12/17/2014, 1:53 PM  LOS: 5 days

## 2014-12-17 NOTE — Progress Notes (Signed)
At 0500 Pt confused and sentence structure inapropriate. Pt seems very sleepy and unable to keep eyes open for very long, this does not correlate with the amount of medication patient has received throughout the night.  VS checked and pt is stable. This RN found a black bag at bedside containing a large bottle of hydrocodone, zanaflex, and valium. This RN removed bag and placed in pts closet. Charge nurse was then notified. At 0630 Pt awoke and requested to have her black bag back at her side. This RN informed her that this was against [facility] policy for her to take her own medications from home and they would have to be counted and taken to pharmacy as well as unsafe for her. Pt refused to do this and is demanding to be discharged so that she can go home and take her own medication. MD on call notified. Awaiting call back. In the mean time pt began discussing with RN how she understands hospital policy and is willing to stay in patient if MD is willing to adjust medication to follow prescription regimen from home. Report given to oncoming RN. Nursing will continue to monitor.

## 2014-12-17 NOTE — Progress Notes (Signed)
Sent a text to Dr. Grandville Silos regarding finding 4 pills in her bed under the chuck.  Her husband pulled out 2 of them and the other 2 were half gone.  These were not [facility] medication; these were medications she had brought from home.  These medications have been taken to pharmacy per the request of Dr. Grandville Silos. Patient was upset with me for removing her medications.  Patient has said she is leaving tomorrow.

## 2014-12-17 NOTE — Progress Notes (Signed)
PT Cancellation Note  Patient Details Name: [name] MRN: [id number] DOB: October 28, 1970   Cancelled Treatment:    Reason Eval/Treat Not Completed: Fatigue/lethargy limiting ability to participate.  Pt was confused and lethargic, very unfocused and unable to be seen.  Nursing is aware and have noted on pt having medications from home.  Ramond Dial 12/17/2014, 4:36 PM   Mee Hives, PT MS Acute Rehab Dept. Number: [phone] [id number] and [place] [phone]

## 2014-12-18 DIAGNOSIS — F1123 Opioid dependence with withdrawal: Secondary | ICD-10-CM | POA: Clinically undetermined

## 2014-12-18 DIAGNOSIS — R112 Nausea with vomiting, unspecified: Secondary | ICD-10-CM

## 2014-12-18 DIAGNOSIS — F1193 Opioid use, unspecified with withdrawal: Secondary | ICD-10-CM | POA: Clinically undetermined

## 2014-12-18 LAB — BASIC METABOLIC PANEL
Anion gap: 10 (ref 5–15)
BUN: <5 mg/dL — ABNORMAL LOW (ref 6–20)
CALCIUM: 8.7 mg/dL — AB (ref 8.9–10.3)
CO2: 23 mmol/L (ref 22–32)
CREATININE: 0.54 mg/dL (ref 0.44–1.00)
Chloride: 105 mmol/L (ref 101–111)
GFR calc non Af Amer: >60 mL/min (ref >60)
Glucose, Bld: 98 mg/dL (ref 65–99)
Potassium: 4.3 mmol/L (ref 3.5–5.1)
SODIUM: 138 mmol/L (ref 135–145)

## 2014-12-18 LAB — CBC
HCT: 34.5 % — ABNORMAL LOW (ref 36.0–46.0)
Hemoglobin: 11.5 g/dL — ABNORMAL LOW (ref 12.0–15.0)
MCH: 30.7 pg (ref 26.0–34.0)
MCHC: 33.3 g/dL (ref 30.0–36.0)
MCV: 92 fL (ref 78.0–100.0)
Platelets: 270 10*3/uL (ref 150–400)
RBC: 3.75 MIL/uL — ABNORMAL LOW (ref 3.87–5.11)
RDW: 13.9 % (ref 11.5–15.5)
WBC: 9.1 10*3/uL (ref 4.0–10.5)

## 2014-12-18 MED ORDER — ONDANSETRON 4 MG PO TBDP
4.0000 mg | ORAL_TABLET | Freq: Four times a day (QID) | ORAL | Status: DC | PRN
  Filled 2014-12-18: qty 1

## 2014-12-18 MED ORDER — METOPROLOL TARTRATE 1 MG/ML IV SOLN
5.0000 mg | Freq: Four times a day (QID) | INTRAVENOUS | Status: DC | PRN
  Administered 2014-12-18: 5 mg via INTRAVENOUS
  Filled 2014-12-18 (×2): qty 5

## 2014-12-18 MED ORDER — TIZANIDINE HCL 4 MG PO TABS
4.0000 mg | ORAL_TABLET | Freq: Three times a day (TID) | ORAL | Status: DC
  Administered 2014-12-18 – 2014-12-20 (×7): 4 mg via ORAL
  Filled 2014-12-18 (×7): qty 1

## 2014-12-18 MED ORDER — NAPROXEN 250 MG PO TABS: 500.0000 mg | ORAL_TABLET | Freq: Two times a day (BID) | ORAL | Status: DC | PRN

## 2014-12-18 MED ORDER — METHOCARBAMOL 500 MG PO TABS
500.0000 mg | ORAL_TABLET | Freq: Three times a day (TID) | ORAL | Status: DC | PRN
  Administered 2014-12-18 – 2014-12-20 (×2): 500 mg via ORAL
  Filled 2014-12-18 (×2): qty 1

## 2014-12-18 MED ORDER — DICYCLOMINE HCL 20 MG PO TABS
20.0000 mg | ORAL_TABLET | Freq: Four times a day (QID) | ORAL | Status: DC | PRN
  Administered 2014-12-18: 20 mg via ORAL
  Filled 2014-12-18: qty 1

## 2014-12-18 MED ORDER — LOPERAMIDE HCL 2 MG PO CAPS
2.0000 mg | ORAL_CAPSULE | ORAL | Status: DC | PRN
  Filled 2014-12-18: qty 2

## 2014-12-18 MED ORDER — DULOXETINE HCL 30 MG PO CPEP
30.0000 mg | ORAL_CAPSULE | Freq: Every day | ORAL | Status: DC
  Administered 2014-12-18 – 2014-12-20 (×3): 30 mg via ORAL
  Filled 2014-12-18 (×3): qty 1

## 2014-12-18 MED ORDER — HYDROXYZINE HCL 25 MG PO TABS: 25.0000 mg | ORAL_TABLET | Freq: Four times a day (QID) | ORAL | Status: DC | PRN

## 2014-12-18 NOTE — Progress Notes (Signed)
Physical Therapy Treatment Patient Details Name: [name] MRN: [id number] DOB: [birth year] Today's Date: 12/18/2014    History of Present Illness 44 year old female with a history of hypertension, hyperlipidemia, anxiety, PTSD presented with a fall landing on her buttocks and her back. In the past 24 hours, the patient also began having numerous episodes of bilious vomiting and abdominal pain. She denies any fevers, chills, diarrhea, hematochezia, melena. There is been no recent sick contacts or she has not had any unusual travel or eating any raw or undercooked foods. At the time of presentation, the patient was noted to be tachycardic with heart rate 1:30-140 with a BBC 17.0 and lactic acid 4.27. The patient was treated for SIRS--fluids, intravenous antibiotics were started. CT of the lumbar spine revealed nondisplaced right sacral fracture. The patient states that 2-3 days prior to admission, she went to see her primary care physician who started her on acyclovir for shingles.    PT Comments    With some encouragement and prompting my MD patient agreeable to attempt ambulation. Still very limited by pain and will need WC for home and community use. Case Manager aware. Patient thankful to be up once she was in the recliner. Husband will be with her at home and while he was not in the room, I have spoke with him in previous sessions and he is prepared to care for her.   Follow Up Recommendations  Home health PT;Supervision/Assistance - 24 hour     Equipment Recommendations  Other (comment);Wheelchair (measurements PT);Wheelchair cushion (measurements PT)    Recommendations for Other Services       Precautions / Restrictions Precautions Precautions: Fall    Mobility  Bed Mobility Overal bed mobility: Needs Assistance Bed Mobility: Supine to Sit     Supine to sit: Min assist     General bed mobility comments: Needed assist for LEs and trunk elevation due to anxiety and pain.    Transfers Overall transfer level: Needs assistance Equipment used: Rolling walker (2 wheeled)   Sit to Stand: Min assist         General transfer comment: Min A to stablize into standing. Cues for safe hand placement and technique  Ambulation/Gait Ambulation/Gait assistance: Min guard Ambulation Distance (Feet): 15 Feet Assistive device: Rolling walker (2 wheeled) Gait Pattern/deviations: Step-to pattern;Decreased weight shift to right;Decreased step length - left;Decreased stance time - right Gait velocity: decreased   General Gait Details: Cues for weight through RW and positioning of RW especially with turns. Continues to be limited with ambulation due to pain   Stairs            Wheelchair Mobility    Modified Rankin (Stroke Patients Only)       Balance                                    Cognition Arousal/Alertness: Awake/alert Behavior During Therapy: Anxious Overall Cognitive Status: Within Functional Limits for tasks assessed                      Exercises      General Comments        Pertinent Vitals/Pain Pain Score: 8  Pain Location: pelvic pain Pain Descriptors / Indicators: Aching;Sore;Spasm Pain Intervention(s): Monitored during session    Home Living  Prior Function            PT Goals (current goals can now be found in the care plan section) Progress towards PT goals: Progressing toward goals    Frequency  Min 5X/week    PT Plan Current plan remains appropriate    Co-evaluation             End of Session   Activity Tolerance: Patient limited by pain Patient left: in chair;with call bell/phone within reach     Time: 1111-1137 PT Time Calculation (min) (ACUTE ONLY): 26 min  Charges:  $Gait Training: 8-22 mins $Therapeutic Activity: 8-22 mins                    G Codes:      Jacqualyn Posey 12/18/2014, 1:11 PM 12/18/2014 Jacqualyn Posey PTA [phone] pager [phone] office

## 2014-12-18 NOTE — Progress Notes (Signed)
Day shift RN informed night shift RN that central telemetry was on the phone and that patients heart rate had been in the 130's since 0650. Night shift RN text paged [facility]- Grandville Silos, MD. Night shift RN received a call back from Dr. Grandville Silos. Night shift RN informed MD that patient is currently complaining of having a fever (patients current temperature is 99.1 taken by NT around 0730). Patient had previously complained of a fever around 0600 and night shift RN administered Tylenol 650 mg at 0613 (patients temperature at this time is 99.0). Night shift RN informed MD that patient is flushed and fanning but otherwise asymptomatic excluding heart rate. Grandville Silos, MD informed the night shift RN that the patient is currently going through opiate withdrawal hence the fact that the patient is tachycardic, flushed, and complaining of constant pain. Grandville Silos, MD informed night shift RN that patient may be dealing with some depression and a psychiatric evaluation may be needed. Patient was found by day shift RN on 8/26 with narcotics hidden under her bed pad. Narcotics were seized by RN's and delivered to the pharmacy. Patient reported to night shift RN that she takes 6 Norco per day at home and that the medical team is not managing her pain well by allowing her only 1 Norco per 6 hours (last dose administered at 0512). Patient even mentioned to night shift RN the possibility of leaving against medical advice. Night shift RN informed from MD that patients husband hides her narcotics and locks them up at home in hopes of preventing his wife from overdosing on narcotics with no success because patient always finds the narcotics or breaks the lock where her husband hides the pills or in an end result goes and gets the prescription refilled in order to manage her pain in what she believes to be a therapeutic way. MD ordered intravenous fluids to be discontinued put ordered the nursing team to encourage and promote  hydration. MD also ordered night shift RN to administer intravenous lopressor if the patients heart rate reaches 140's - 150's. Day shift RN informed night shift RN that she will be administering intravenous lopressor because the patients heart rate has reached the 160's. MD ordered clonidine detox protocol. Orders were verbally confirmed by night shift RN. Night shift RN informed day shift RN of all verbal orders before leaving unit. Nursing will continue to monitor.

## 2014-12-18 NOTE — Progress Notes (Signed)
Charting for clinical opiate withdrawal score at 0700 is incorrect. Scoring did not start unitl 1600. Time adjusted and filed.

## 2014-12-18 NOTE — Progress Notes (Signed)
[facility] PROGRESS NOTE  [name] [id number] DOB: [birth year] DOA: 12/11/2014 PCP: Leonard Downing, MD  Brief history 44 year old female with a history of hypertension, hyperlipidemia, anxiety, PTSD presented with a fall landing on her buttocks and her back. In the past 24 hours, the patient also began having numerous episodes of bilious vomiting and abdominal pain. She denies any fevers, chills, diarrhea, hematochezia, melena. There is been no recent sick contacts or she has not had any unusual travel or eating any raw or undercooked foods. At the time of presentation, the patient was noted to be tachycardic with heart rate 130-140 with a WBC 17.0 and lactic acid 4.27. The patient was treated for SIRS--fluids, intravenous antibiotics were started. CT of the lumbar spine revealed nondisplaced right sacral fracture. The patient states that 2-3 days prior to admission, she went to see her primary care physician who started her on acyclovir for shingles. Since admission, the patient was converted to intravenous acyclovir due to her vomiting and inability to tolerate oral medications   Assessment/Plan: #1 tachycardia/lactic acidosis Unknown etiology. Chest x-ray is negative for any acute infiltrates. Urinalysis was nitrite negative leukocytes negative. Blood cultures were no growth to date. Patient is afebrile. WBC is trending down. Lactic acid level has trended down. Heart rate improving with adjusted doses of metoprolol and IV fluids. 2-D echo with EF of 55-60% with no wall motion abnormalities. TSH within normal limits at 1.464. Patient has been off antibiotics. Patient noted to be hypotensive 2 nights ago which has since resolved. Cultures were no growth to date. Hypotension may be secondary to drug medications as patient was noted to have her own home medications found in the bed. Follow.  #2 nausea/vomiting/abdominal pain Clinical improvement. Patient currently tolerating a  soft diet. CT abdomen and pelvis was negative for any acute intra-abdominal abnormalities except a distended urinary bladder which required intermittent catheterizations. Patient able to urinate spontaneously. 12/13/2014 vomiting improved patient wanted to eat patient started on full liquids. 12/14/2014 patient improved clinically patient's diet advanced to a soft diet. Continue oral pain medications. Continue oral home was Valium.  #3 hypotension Questionable etiology. Likely secondary to drug-induced as medication pills were found under the patient which were likely her home regimen per nursing.  IV pain medications have been discontinued. Hypotension has improved. Cardiac enzymes minimally elevated with no acute abnormalities. Cortisol levels of 5.4. Urinalysis was nitrite negative, leukocytes negative. Blood cultures are preparing a pending. Lactic acid level is within normal limits. Chest x-ray with no acute infiltrate. Nursing has been instructed to remove all patient's home medications from the room and sent to the pharmacy. Discontinued metoprolol.  it was noted per nursing that patient just filled in all: On 11/29/2014 for 180 pills, and 90 pills was noted left.  Follow.  #4 probable opiate withdrawal  patient was noted to be tachycardic, complaining of pain all over, complaining of a flushing sensation. Patient was also noted to be taking some of her home supply of pain medications. Patient did state was taken 8 mg of Zanaflex 3 times daily at home, which was not resumed on admission. Patient also states was taken much higher doses of opiates at home as prescribed by her PCP. Will place patient on the clonidine detox protocol. Will place patient back on half home regimen of Zanaflex at 4 mg 3 times daily. Continue current regimen of Valium. Continue current regimen of Norco. Patient will need to follow-up with PCP as outpatient will likely need to decrease  her doses of opiates and muscle relaxants.     #5 herpes zoster dermatitis Change IV acyclovir to oral acyclovir on discharge to complete a 7- 10 day course.  #6 nondisplaced sacral fracture PT/OT. Dr. Carles Collet discussed with Dr. [name] of orthopedics who recommended weightbearing as tolerated with outpatient follow-up.  #7 anxiety/PTSD/depression Continue oral Valium. Patient was prescribed Cymbalta 30 mg daily as per her PCP which was not started due to financial issues. Per husband patient noted to be depressed and usually just lays around in bed. Patient has been to [facility] once per husband and scheduled to go there next week. Patient does have a flat affect. Will start patient back on Cymbalta 30 mg daily and see if case manager may be able to help patient attained his medication. Psychiatry consultation pending.   #8 hypertension Blood pressure has improved.  Discontinued metoprolol.   #9 sinus tachycardia Likely secondary to acute issues versus possible withdrawal from muscle relaxants or oral pain medications. TSH within normal limits at 1.464. 2-D echo with EF of 55-60% with no wall abnormalities. Patient with no signs or symptoms of infection. Patient noted to be hypotensive overnight. Heart rate has improved with hydration. Discontinued beta blocker. Will place patient back on half of what she was taken abnormal for Zanaflex 4 mg 3 times daily. Continue current pain regimen. Patient will likely need to taper down on her doses of opiates and Flexeril. Outpatient follow-up.   #10 prophylaxis SCDs for DVT prophylaxis.  Code Status: Full Family Communication: Updated patient. No family at bedside.. Disposition Plan: Home when heart rate has improved and hypotension has resolved, hopefully 1-2 days.   Consultants:  None  Procedures:  CT abdomen and pelvis 12/12/2014  CT of the L-spine 12/11/2014  Chest x-ray 12/12/2014, 12/13/2014  2-D echo 12/13/2014  Antibiotics:  IV Zosyn 12/12/2014>>>> 12/14/2014  IV vancomycin  12/12/2014>>>>> 12/14/2014  HPI/Subjective: Patient alert and following commands. Patient states she was taking a home supply of pain medications that she feels the pain wasn't being managed adequately in the hospital. Patient states was taking 8 mg of Zanaflex 3 times daily.  Objective: Filed Vitals:   12/18/14 0515  BP: 147/96  Pulse: 124  Temp: 99 F (37.2 C)  Resp: 18   No intake or output data in the 24 hours ending 12/18/14 1122 Filed Weights   12/11/14 1829 12/12/14 0400  Weight: 74.844 kg (165 lb) 79.5 kg (175 lb 4.3 oz)    Exam:   General:  Patient alert.  Cardiovascular: RRR  Respiratory: CTAB  Abdomen: Soft, nontender, nondistended, positive bowel sounds.  Musculoskeletal: No clubbing cyanosis or edema.  Data Reviewed: Basic Metabolic Panel:  Recent Labs Lab 12/15/14 0513 12/16/14 0536 12/16/14 2255 12/17/14 0526 12/18/14 0350  NA 135 137 139 135 138  K 3.7 4.0 3.4* 3.6 4.3  CL 100* 106 105 103 105  CO2 24 23 26 26 23   GLUCOSE 118* 95 112* 119* 98  BUN <5* 6 <5* <5* <5*  CREATININE 0.53 0.52 0.50 0.56 0.54  CALCIUM 8.3* 7.8* 8.2* 7.8* 8.7*   Liver Function Tests:  Recent Labs Lab 12/11/14 2334 12/13/14 0255  AST 46* 36  ALT 27 31  ALKPHOS 93 85  BILITOT 0.7 0.9  PROT 7.8 8.0  ALBUMIN 4.2 3.9    Recent Labs Lab 12/11/14 2334  LIPASE 12*   No results for input(s): AMMONIA in the last 168 hours. CBC:  Recent Labs Lab 12/12/14 0035  12/15/14 0513 12/16/14 0536  12/16/14 2255 12/17/14 0526 12/18/14 0350  WBC 17.0*  < > 10.3 8.3 10.0 9.4 9.1  NEUTROABS 13.5*  --   --   --  7.8*  --   --   HGB 13.5  < > 11.4* 9.6* 10.1* 9.0* 11.5*  HCT 39.2  < > 35.1* 28.9* 30.3* 27.6* 34.5*  MCV 90.1  < > 93.4 92.3 92.9 92.3 92.0  PLT 269  < > 290 221 249 236 270  < > = values in this interval not displayed. Cardiac Enzymes:  Recent Labs Lab 12/16/14 2255 12/17/14 0107 12/17/14 0526  TROPONINI <0.03 <0.03 <0.03   BNP (last 3  results) No results for input(s): BNP in the last 8760 hours.  ProBNP (last 3 results) No results for input(s): PROBNP in the last 8760 hours.  CBG: No results for input(s): GLUCAP in the last 168 hours.  Recent Results (from the past 240 hour(s))  Culture, blood (routine x 2)     Status: None   Collection Time: 12/12/14 12:25 AM  Result Value Ref Range Status   Specimen Description BLOOD LEFT ARM  Final   Special Requests BOTTLES DRAWN AEROBIC AND ANAEROBIC 5CC   Final   Culture NO GROWTH 5 DAYS  Final   Report Status 12/17/2014 FINAL  Final  Culture, blood (routine x 2)     Status: None   Collection Time: 12/12/14 12:35 AM  Result Value Ref Range Status   Specimen Description BLOOD LEFT HAND  Final   Special Requests BOTTLES DRAWN AEROBIC ONLY 5CC  Final   Culture NO GROWTH 5 DAYS  Final   Report Status 12/17/2014 FINAL  Final  MRSA PCR Screening     Status: None   Collection Time: 12/12/14  3:28 AM  Result Value Ref Range Status   MRSA by PCR NEGATIVE NEGATIVE Final    Comment:        The GeneXpert MRSA Assay (FDA approved for NASAL specimens only), is one component of a comprehensive MRSA colonization surveillance program. It is not intended to diagnose MRSA infection nor to guide or monitor treatment for MRSA infections.   Culture, blood (routine x 2)     Status: None (Preliminary result)   Collection Time: 12/16/14  7:26 PM  Result Value Ref Range Status   Specimen Description BLOOD RIGHT HAND  Final   Special Requests IN PEDIATRIC BOTTLE 3CC  Final   Culture NO GROWTH < 24 HOURS  Final   Report Status PENDING  Incomplete  Culture, Urine     Status: None   Collection Time: 12/16/14 10:40 PM  Result Value Ref Range Status   Specimen Description URINE, CLEAN CATCH  Final   Special Requests NONE  Final   Culture 7,000 COLONIES/mL INSIGNIFICANT GROWTH  Final   Report Status 12/17/2014 FINAL  Final     Studies: Dg Chest Port 1 View  12/16/2014   CLINICAL  DATA:  Hypotension ; history hypertension  EXAM: PORTABLE CHEST - 1 VIEW  COMPARISON:  Portable exam 1827 hours compared to 12/13/2014  FINDINGS: Upper normal heart size.  Normal mediastinal contours and pulmonary vascularity  RIGHT basilar subsegmental atelectasis.  Questionable retrocardiac LEFT lower lobe infiltrate.  Remaining lungs clear.  No pleural effusion or pneumothorax.  IMPRESSION: Subsegmental atelectasis RIGHT base with question atelectasis versus infiltrate in retrocardiac LEFT lower lobe.   Electronically Signed   By: Lavonia Dana M.D.   On: 12/16/2014 18:39    Scheduled Meds: . acyclovir  10  mg/kg (Ideal) Intravenous 3 times per day  . antiseptic oral rinse  7 mL Mouth Rinse BID  . diazepam  5 mg Oral Q6H  . metoCLOPramide  5 mg Oral TID AC   Continuous Infusions:    Principal Problem:   Sacral fracture Active Problems:   Sepsis   Sinus tachycardia   Lactic acidosis   Intractable abdominal pain   Fall   Nausea and vomiting   Leukocytosis   Chronic lower back pain   Shingles outbreak   Nausea with vomiting   Herpes simplex virus type 1 (HSV-1) dermatitis   Hypotension   Depression   Opiate withdrawal    Time spent: 34 minutes    Jody Silas M.D. [facility] Pager [phone]. If 7PM-7AM, please contact night-coverage at [email or web address], password [facility] 12/18/2014, 11:22 AM  LOS: 6 days

## 2014-12-19 DIAGNOSIS — S3210XG Unspecified fracture of sacrum, subsequent encounter for fracture with delayed healing: Secondary | ICD-10-CM

## 2014-12-19 DIAGNOSIS — R109 Unspecified abdominal pain: Secondary | ICD-10-CM

## 2014-12-19 DIAGNOSIS — R0600 Dyspnea, unspecified: Secondary | ICD-10-CM | POA: Insufficient documentation

## 2014-12-19 LAB — BASIC METABOLIC PANEL
ANION GAP: 14 (ref 5–15)
CHLORIDE: 105 mmol/L (ref 101–111)
CO2: 22 mmol/L (ref 22–32)
Calcium: 9 mg/dL (ref 8.9–10.3)
Creatinine, Ser: 0.51 mg/dL (ref 0.44–1.00)
GFR calc non Af Amer: >60 mL/min (ref >60)
Glucose, Bld: 76 mg/dL (ref 65–99)
POTASSIUM: 3.7 mmol/L (ref 3.5–5.1)
SODIUM: 141 mmol/L (ref 135–145)

## 2014-12-19 MED ORDER — ACYCLOVIR 200 MG PO CAPS: 600.0000 mg | ORAL_CAPSULE | Freq: Every day | ORAL | Status: DC

## 2014-12-19 MED ORDER — ACYCLOVIR 200 MG PO CAPS
600.0000 mg | ORAL_CAPSULE | Freq: Once | ORAL | Status: AC
  Administered 2014-12-19: 600 mg via ORAL
  Filled 2014-12-19: qty 3

## 2014-12-19 NOTE — Progress Notes (Addendum)
[facility] PROGRESS NOTE  [name] [id number] DOB: Jun 28, 1970 DOA: 12/11/2014 PCP: Leonard Downing, MD  Brief history 44 year old female with a history of hypertension, hyperlipidemia, anxiety, PTSD presented with a fall landing on her buttocks and her back. In the past 24 hours, the patient also began having numerous episodes of bilious vomiting and abdominal pain. She denies any fevers, chills, diarrhea, hematochezia, melena. There is been no recent sick contacts or she has not had any unusual travel or eating any raw or undercooked foods. At the time of presentation, the patient was noted to be tachycardic with heart rate 130-140 with a WBC 17.0 and lactic acid 4.27. The patient was treated for SIRS--fluids, intravenous antibiotics were started. CT of the lumbar spine revealed nondisplaced right sacral fracture. The patient states that 2-3 days prior to admission, she went to see her primary care physician who started her on acyclovir for shingles. Since admission, the patient was converted to intravenous acyclovir due to her vomiting and inability to tolerate oral medications   Assessment/Plan: #1 tachycardia/lactic acidosis Unknown etiology. Chest x-ray is negative for any acute infiltrates. Urinalysis was nitrite negative leukocytes negative. Blood cultures were no growth to date. Patient is afebrile. WBC is trending down. Lactic acid level has trended down. Heart rate improving with adjusted doses of metoprolol and IV fluids. 2-D echo with EF of 55-60% with no wall motion abnormalities. TSH within normal limits at 1.464. Patient has been off antibiotics. Patient noted to be hypotensive 2 nights ago which has since resolved. Cultures were no growth to date. Hypotension may be secondary to drug medications as patient was noted to have her own home medications found in the bed. Follow.  #2 nausea/vomiting/abdominal pain Clinical improvement. Patient currently tolerating a  soft diet. CT abdomen and pelvis was negative for any acute intra-abdominal abnormalities except a distended urinary bladder which required intermittent catheterizations. Patient able to urinate spontaneously. 12/13/2014 vomiting improved patient wanted to eat patient started on full liquids. 12/14/2014 patient improved clinically patient's diet advanced to a soft diet. Continue oral pain medications. Continue oral home was Valium.  #3 hypotension Questionable etiology. Likely secondary to drug-induced as medication pills were found under the patient which were likely her home regimen per nursing.  IV pain medications have been discontinued. Hypotension has improved. Cardiac enzymes minimally elevated with no acute abnormalities. Cortisol levels of 5.4. Urinalysis was nitrite negative, leukocytes negative. Blood cultures are preparing a pending. Lactic acid level is within normal limits. Chest x-ray with no acute infiltrate. Nursing has been instructed to remove all patient's home medications from the room and sent to the pharmacy. Discontinued metoprolol.  it was noted per nursing that patient just filled in all: On 11/29/2014 for 180 pills, and 90 pills was noted left. Stable. Follow.  #4 probable opiate withdrawal  patient was noted to be tachycardic, complaining of pain all over, complaining of a flushing sensation. Patient was also noted to be taking some of her home supply of pain medications. Patient did state was taking 8 mg of Zanaflex 3 times daily at home, which was not resumed on admission. Patient also states was taken much higher doses of opiates at home as prescribed by her PCP. Continue clonidine detox protocol, half home regimen of Zanaflex at 4 mg 3 times daily. Continue current regimen of Valium. Continue current regimen of Norco. Patient will need to follow-up with PCP as outpatient will likely need to decrease her doses of opiates and muscle relaxants.    #  5 herpes zoster  dermatitis Change IV acyclovir to oral acyclovir on discharge to complete a 7- 10 day course.  #6 nondisplaced sacral fracture PT/OT. Dr. Carles Collet discussed with Dr. [name] of orthopedics who recommended weightbearing as tolerated with outpatient follow-up.  #7 anxiety/PTSD/depression Continue oral Valium. Patient was prescribed Cymbalta 30 mg daily as per her PCP which was not started due to financial issues. Per husband patient noted to be depressed and usually just lays around in bed. Patient has been to [facility] once per husband and scheduled to go there next week. Patient does have a flat affect. Continue Cymbalta 30 mg daily and see if case manager may be able to help patient attained his medication. Psychiatry consultation pending.   #8 hypertension Blood pressure has improved.  Discontinued metoprolol.   #9 sinus tachycardia Likely secondary to acute issues versus possible withdrawal from muscle relaxants or oral pain medications. TSH within normal limits at 1.464. 2-D echo with EF of 55-60% with no wall abnormalities. Patient with no signs or symptoms of infection. Patient noted to be hypotensive 2 nights ago. Heart rate has improved with hydration. Discontinued beta blocker. Continue Zanaflex 4 mg 3 times daily. Continue current pain regimen. Patient will likely need to taper down on her doses of opiates and Flexeril. Outpatient follow-up.   #10 prophylaxis SCDs for DVT prophylaxis.  Code Status: Full Family Communication: Updated patient. No family at bedside.. Disposition Plan: Home when heart rate has improved and hypotension has resolved, hopefully  tomorrow.   Consultants:  None  Procedures:  CT abdomen and pelvis 12/12/2014  CT of the L-spine 12/11/2014  Chest x-ray 12/12/2014, 12/13/2014  2-D echo 12/13/2014  Antibiotics:  IV Zosyn 12/12/2014>>>> 12/14/2014  IV vancomycin 12/12/2014>>>>> 12/14/2014  HPI/Subjective: Patient alert and following commands. Patient  states she was able to get up today to use the bedside commode. She denies any chest pain. No shortness of breath. Patient still with complaints of pain all over.  Objective: Filed Vitals:   12/19/14 0518  BP: 153/97  Pulse: 98  Temp: 98.1 F (36.7 C)  Resp: 16   No intake or output data in the 24 hours ending 12/19/14 1037 Filed Weights   12/11/14 1829 12/12/14 0400  Weight: 74.844 kg (165 lb) 79.5 kg (175 lb 4.3 oz)    Exam:   General:  Patient alert.  Cardiovascular: RRR  Respiratory: CTAB  Abdomen: Soft, nontender, nondistended, positive bowel sounds.  Musculoskeletal: No clubbing cyanosis or edema.  Data Reviewed: Basic Metabolic Panel:  Recent Labs Lab 12/16/14 0536 12/16/14 2255 12/17/14 0526 12/18/14 0350 12/19/14 0627  NA 137 139 135 138 141  K 4.0 3.4* 3.6 4.3 3.7  CL 106 105 103 105 105  CO2 23 26 26 23 22   GLUCOSE 95 112* 119* 98 76  BUN 6 <5* <5* <5* <5*  CREATININE 0.52 0.50 0.56 0.54 0.51  CALCIUM 7.8* 8.2* 7.8* 8.7* 9.0   Liver Function Tests:  Recent Labs Lab 12/13/14 0255  AST 36  ALT 31  ALKPHOS 85  BILITOT 0.9  PROT 8.0  ALBUMIN 3.9   No results for input(s): LIPASE, AMYLASE in the last 168 hours. No results for input(s): AMMONIA in the last 168 hours. CBC:  Recent Labs Lab 12/15/14 0513 12/16/14 0536 12/16/14 2255 12/17/14 0526 12/18/14 0350  WBC 10.3 8.3 10.0 9.4 9.1  NEUTROABS  --   --  7.8*  --   --   HGB 11.4* 9.6* 10.1* 9.0* 11.5*  HCT 35.1*  28.9* 30.3* 27.6* 34.5*  MCV 93.4 92.3 92.9 92.3 92.0  PLT 290 221 249 236 270   Cardiac Enzymes:  Recent Labs Lab 12/16/14 2255 12/17/14 0107 12/17/14 0526  TROPONINI <0.03 <0.03 <0.03   BNP (last 3 results) No results for input(s): BNP in the last 8760 hours.  ProBNP (last 3 results) No results for input(s): PROBNP in the last 8760 hours.  CBG: No results for input(s): GLUCAP in the last 168 hours.  Recent Results (from the past 240 hour(s))  Culture,  blood (routine x 2)     Status: None   Collection Time: 12/12/14 12:25 AM  Result Value Ref Range Status   Specimen Description BLOOD LEFT ARM  Final   Special Requests BOTTLES DRAWN AEROBIC AND ANAEROBIC 5CC   Final   Culture NO GROWTH 5 DAYS  Final   Report Status 12/17/2014 FINAL  Final  Culture, blood (routine x 2)     Status: None   Collection Time: 12/12/14 12:35 AM  Result Value Ref Range Status   Specimen Description BLOOD LEFT HAND  Final   Special Requests BOTTLES DRAWN AEROBIC ONLY 5CC  Final   Culture NO GROWTH 5 DAYS  Final   Report Status 12/17/2014 FINAL  Final  MRSA PCR Screening     Status: None   Collection Time: 12/12/14  3:28 AM  Result Value Ref Range Status   MRSA by PCR NEGATIVE NEGATIVE Final    Comment:        The GeneXpert MRSA Assay (FDA approved for NASAL specimens only), is one component of a comprehensive MRSA colonization surveillance program. It is not intended to diagnose MRSA infection nor to guide or monitor treatment for MRSA infections.   Culture, blood (routine x 2)     Status: None (Preliminary result)   Collection Time: 12/16/14  7:26 PM  Result Value Ref Range Status   Specimen Description BLOOD RIGHT HAND  Final   Special Requests IN PEDIATRIC BOTTLE 3CC  Final   Culture NO GROWTH 2 DAYS  Final   Report Status PENDING  Incomplete  Culture, Urine     Status: None   Collection Time: 12/16/14 10:40 PM  Result Value Ref Range Status   Specimen Description URINE, CLEAN CATCH  Final   Special Requests NONE  Final   Culture 7,000 COLONIES/mL INSIGNIFICANT GROWTH  Final   Report Status 12/17/2014 FINAL  Final  Culture, blood (routine x 2)     Status: None (Preliminary result)   Collection Time: 12/17/14 12:01 AM  Result Value Ref Range Status   Specimen Description BLOOD RIGHT FOOT  Final   Special Requests BOTTLES DRAWN AEROBIC AND ANAEROBIC 5CC  Final   Culture NO GROWTH 1 DAY  Final   Report Status PENDING  Incomplete      Studies: No results found.  Scheduled Meds: . acyclovir  10 mg/kg (Ideal) Intravenous 3 times per day  . antiseptic oral rinse  7 mL Mouth Rinse BID  . diazepam  5 mg Oral Q6H  . DULoxetine  30 mg Oral Daily  . metoCLOPramide  5 mg Oral TID AC  . tiZANidine  4 mg Oral TID   Continuous Infusions:    Principal Problem:   Sacral fracture Active Problems:   Sepsis   Sinus tachycardia   Lactic acidosis   Intractable abdominal pain   Fall   Nausea and vomiting   Leukocytosis   Chronic lower back pain   Shingles outbreak   Nausea  with vomiting   Herpes simplex virus type 1 (HSV-1) dermatitis   Hypotension   Depression   Opiate withdrawal    Time spent: 52 minutes    Aneyah Lortz M.D. [facility] Pager [phone]. If 7PM-7AM, please contact night-coverage at [email or web address], password [facility] 12/19/2014, 10:37 AM  LOS: 7 days

## 2014-12-20 DIAGNOSIS — B029 Zoster without complications: Secondary | ICD-10-CM

## 2014-12-20 MED ORDER — DOCUSATE SODIUM 100 MG PO CAPS: 100.0000 mg | ORAL_CAPSULE | Freq: Two times a day (BID) | ORAL | Status: DC

## 2014-12-20 MED ORDER — AMLODIPINE BESYLATE 10 MG PO TABS
10.0000 mg | ORAL_TABLET | Freq: Every day | ORAL | Status: DC
  Administered 2014-12-20: 10 mg via ORAL
  Filled 2014-12-20: qty 1

## 2014-12-20 MED ORDER — POLYETHYLENE GLYCOL 3350 17 G PO PACK: 17.0000 g | PACK | Freq: Every day | ORAL | Status: DC

## 2014-12-20 MED ORDER — DULOXETINE HCL 30 MG PO CPEP: 30.0000 mg | ORAL_CAPSULE | Freq: Every day | ORAL | Status: DC

## 2014-12-20 MED ORDER — TIZANIDINE HCL 4 MG PO TABS: 4.0000 mg | ORAL_TABLET | Freq: Three times a day (TID) | ORAL | Status: DC

## 2014-12-20 NOTE — Care Management Note (Addendum)
Case Management Note  Patient Details  Name: [name] MRN: [id number] Date of Birth: 27-May-1970  Subjective/Objective:             Sacral fracture       Action/Plan: Gave patient [place] letter and list of participating pharmacies and explained program to her. Notified by [facility] that patient does not meet criteria for equipment assistance thru [facility], explained that to patient and her husband. Gave patient a rolling walker thru [facility].   Expected Discharge Date:                  Expected Discharge Plan:  Home/Self Care  In-House Referral:  Financial Counselor  Discharge planning Services  CM Consult, [facility] Program  Post Acute Care Choice:  Durable Medical Equipment Choice offered to:  NA  DME Arranged:  Walker rolling DME Agency:     HH Arranged:    [place]:     Status of Service:  Completed, signed off  Medicare Important Message Given:    Date Medicare IM Given:    Medicare IM give by:    Date Additional Medicare IM Given:    Additional Medicare Important Message give by:     If discussed at [place] of Stay Meetings, dates discussed:    Additional Comments:  Nila Nephew, RN 12/20/2014, 11:59 AM

## 2014-12-20 NOTE — Discharge Summary (Signed)
Physician Discharge Summary  [name] [id number] DOB: 1970-06-10 DOA: 12/11/2014  PCP: Tracy Downing, MD  Admit date: 12/11/2014 Discharge date: 12/20/2014  Time spent: 65 minutes  Recommendations for Outpatient Follow-up:  1. Follow-up with Dr. Lorin [name] of orthopedics in 2 weeks to follow-up on sacral fracture. 2. Follow-up with Tracy Downing, MD in 2 weeks. On follow-up patient will need a basic metabolic profile done to follow-up on electrolytes and renal function. Patient's blood pressure need to be reassessed. Patient's pain medications, muscle relaxants will need to be reassessed and possibly taper down. 3. Patient is to follow-up with her psychiatrist as scheduled on 12/22/2014. Patient was started on Cymbalta 30 mg daily during this hospitalization.  Discharge Diagnoses:  Principal Problem:   Sacral fracture Active Problems:   Sepsis   Sinus tachycardia   Lactic acidosis   Intractable abdominal pain   Fall   Nausea and vomiting   Leukocytosis   Chronic lower back pain   Shingles outbreak   Nausea with vomiting   Herpes simplex virus type 1 (HSV-1) dermatitis   Hypotension   Depression   Opiate withdrawal   Dyspnea   Discharge Condition: Stable and improved  Diet recommendation: Regular/soft diet  Filed Weights   12/11/14 1829 12/12/14 0400  Weight: 74.844 kg (165 lb) 79.5 kg (175 lb 4.3 oz)    History of present illness:  Per Dr [name] [name] is a 44 y.o. female with a history of HTN, Hyperlipidemia, DD, Chronic Pain, and Anxiety who was brought to the ED after she tripped and fell and landed on her lower back. She was found to have a non-displaced Right Sacral fracture. While in the ED she developed fever, and tachycardia, and nausea and vomiting while in the ED and was found to have a lactic Acidosis of 4.27. A Sepsis Workup was initiated and she was administered IV Fluids and Given empiric Antibiotics of IV Vancomycin and  Zosyn and referred for admission. She was also given 1 dose of IV Acyclovir for her Shingles outbreak that started 3 days prior to admission.  Hospital Course:  #1 tachycardia/lactic acidosis Unknown etiology. Patient on presentation was noted to be tachycardic with heart rates in the 130s to 140s had a leukocytosis with a white count of 17 and a lactic acid of 4.27. Patient was treated for SIRS with IV fluids empiric IV antibiotics of vancomycin and Zosyn. CT of the lumbar spine revealed nondisplaced right sacral fracture. Chest x-ray was negative for any acute infiltrates. Urinalysis was nitrite negative leukocytes negative. Blood cultures were no growth to date. Patient is afebrile. WBC is trended down. Lactic acid level has trended down. Heart rate improving with adjusted doses of metoprolol and IV fluids. 2-D echo with EF of 55-60% with no wall motion abnormalities. TSH within normal limits at 1.464. Patient received 3 days of IV antibiotics which was subsequently discontinued. Patient remained afebrile. Patient improved clinically and was back to baseline by day of discharge. Patient noted to be hypotensive during the hospitalization, which has since resolved. Cultures with no growth to date. Hypotension likey secondary to drug medications as patient was noted to have her own home medications found in the bed. Patient be discharged in stable and improved condition.   #2 nausea/vomiting/abdominal pain Clinical improvement. CT abdomen and pelvis was negative for any acute intra-abdominal abnormalities except a distended urinary bladder which required intermittent catheterizations. Patient was able to urinate spontaneously. 12/13/2014 vomiting improved patient wanted to eat patient started on  full liquids. 12/14/2014 patient improved clinically patient's diet advanced to a soft diet. Patient was able to tolerate a soft diet. Continued on oral pain medications. Continued on oral home was Valium.  Patient's symptoms had resolved by day of discharge.  #3 hypotension Questionable etiology. Likely secondary to drug-induced as medication pills were found under the patient which were likely her home regimen per nursing. IV pain medications were discontinued. Hypotension improved. Cardiac enzymes minimally elevated with no acute abnormalities. Cortisol levels of 5.4. Urinalysis was nitrite negative, leukocytes negative. Blood cultures are preparing a pending. Lactic acid level is within normal limits. Chest x-ray with no acute infiltrate. Nursing has been instructed to remove all patient's home medications from the room and sent to the pharmacy. Discontinued metoprolol. it was noted per nursing that patient just filled her norco 11/29/2014 for 180 pills, and 90 pills was noted left. Stable. Follow.  #4 probable opiate withdrawal Patient was noted to be tachycardic, complaining of pain all over, complaining of a flushing sensation. Patient was also noted to be taking some of her home supply of pain medications during the hospitalization. Patient did state was taking 8 mg of Zanaflex 3 times daily at home, which was not resumed on admission. Patient also stated was taken much higher doses of opiates at home as prescribed by her PCP. Patient was placed on the clonidine detox protocol, half home regimen of Zanaflex at 4 mg 3 times daily. Patient was continued on her home regimen of  Valium. Patient was also placed on call. Patient improved clinically and withdrawal symptoms had resolved by day of discharge. Patient will need to follow-up with PCP as outpatient. PCP will likely need to decrease her doses of opiates and muscle relaxants.    #5 herpes zoster dermatitis Patient prior to admission had recently been diagnosed with a shingles outbreak. Patient was placed on IV acyclovir during the hospitalization and subsequently transitioned to oral acyclovir. Patient completed a course of therapy during the  hospitalization and received a week's worth of treatment. Outpatient follow-up.   #6 nondisplaced sacral fracture Dr. Carles [name] discussed with Dr. Lorin [name] of orthopedics who recommended weightbearing as tolerated with outpatient follow-up. Patient was seen by physical therapy and home health therapy was recommended. Patient's pain was managed. Outpatient follow-up.  #7 anxiety/PTSD/depression Continued on home regimen of oral Valium. Patient was prescribed Cymbalta 30 mg daily as per her PCP which was not started due to financial issues. Per husband patient noted to be depressed and usually just lays around in bed. Patient has been to [facility] once per husband and scheduled to go there next week. Patient does have a flat affect. Patient was started on Cymbalta 30 mg daily during this hospitalization and will follow-up with her psychiatrist as outpatient.  #8 hypertension Patient was initially placed on metoprolol for blood pressure control and tachycardia however patient became hypotensive and metoprolol was discontinued. Patient's blood pressure responded to IV fluids. Patient be discharged home back on a home regimen of Norvasc.   #9 sinus tachycardia Likely secondary to possible withdrawal from muscle relaxants or oral pain medications. TSH within normal limits at 1.464. 2-D echo with EF of 55-60% with no wall abnormalities. Patient with no signs or symptoms of infection. Patient noted to be hypotensive during the hospitalization however that resolved by day of discharge as her hypotension responded to IV fluids. Heart rate has improved with hydration. Discontinued beta blocker. Patient was started back on Zanaflex 4 mg 3 times daily which was  half home dose that she was taking. Patient was maintained on a home regimen of pain medications as well as her Valium. Patient improved clinically. Patient need to follow-up with PCP as outpatient. Patient will likely need to taper down on her doses of opiates and  muscle relaxants. Outpatient follow-up.    Procedures:  CT abdomen and pelvis 12/12/2014  CT of the L-spine 12/11/2014  Chest x-ray 12/12/2014, 12/13/2014  2-D echo 12/13/2014  Consultations:  None  Discharge Exam: Filed Vitals:   12/20/14 0808  BP: 173/93  Pulse: 110  Temp: 98.5 F (36.9 C)  Resp: 16    General: NAD Cardiovascular: Tachycardia Respiratory: CTAB  Discharge Instructions   Discharge Instructions    Diet general    Complete by:  As directed      Discharge instructions    Complete by:  As directed   Follow up with Dr [name], orthopedics in 2 weeks. Follow up with Tracy Downing, MD in 1-2 weeks. Follow up with psychiatrist as scheduled 12/22/2014 [facility].     Increase activity slowly    Complete by:  As directed   Weight bearing as tolerated.          Current Discharge Medication List    START taking these medications   Details  docusate sodium (COLACE) 100 MG capsule Take 1 capsule (100 mg total) by mouth 2 (two) times daily. Qty: 10 capsule, Refills: 0    DULoxetine (CYMBALTA) 30 MG capsule Take 1 capsule (30 mg total) by mouth daily. Qty: 30 capsule, Refills: 0    polyethylene glycol (MIRALAX / GLYCOLAX) packet Take 17 g by mouth daily. Hold if you develop diarrhea Qty: 14 each, Refills: 0      CONTINUE these medications which have CHANGED   Details  tiZANidine (ZANAFLEX) 4 MG tablet Take 1 tablet (4 mg total) by mouth 3 (three) times daily. Qty: 1 tablet, Refills: 0      CONTINUE these medications which have NOT CHANGED   Details  amLODipine (NORVASC) 10 MG tablet Take 1 tablet (10 mg total) by mouth daily. Qty: 30 tablet, Refills: 2    diazepam (VALIUM) 5 MG tablet Take 5 mg by mouth 4 (four) times daily.     HYDROcodone-acetaminophen (NORCO) 10-325 MG per tablet Take 1 tablet by mouth every 6 (six) hours as needed for moderate pain.      STOP taking these medications     acyclovir (ZOVIRAX) 200 MG capsule         Allergies  Allergen Reactions  . Lactose Intolerance (Gi) Nausea Only   Follow-up Information    Follow up with Marybelle Killings, MD. Schedule an appointment as soon as possible for a visit in 2 weeks.   Specialty:  Orthopedic Surgery   Contact information:   [place] [address] [phone]       Follow up with Tracy Downing, MD. Schedule an appointment as soon as possible for a visit in 2 weeks.   Specialty:  Family Medicine   Why:  f/u in 1-2 weeks   Contact information:   [place] [address] [phone]       Follow up On 12/22/2014.   Why:  f/u with psychiatrist as scheduled       The results of significant diagnostics from this hospitalization (including imaging, microbiology, ancillary and laboratory) are listed below for reference.    Significant Diagnostic Studies: Ct Abdomen Pelvis Wo Contrast  12/12/2014   CLINICAL  DATA:  Patient with severe abdominal pain, vomiting and leukocytosis.  EXAM: CT ABDOMEN AND PELVIS WITHOUT CONTRAST  TECHNIQUE: Multidetector CT imaging of the abdomen and pelvis was performed following the standard protocol without IV contrast.  COMPARISON:  CT abdomen pelvis 10/15/2014  FINDINGS: Lower chest: Normal heart size. Dependent atelectasis within the bilateral lower lobes. No pleural effusion.  Hepatobiliary: Liver is diffusely low in attenuation compatible with hepatic steatosis. Fatty sparing adjacent to the gallbladder fossa. Gallbladder is unremarkable. No intrahepatic or extrahepatic biliary ductal dilatation.  Pancreas: Unremarkable  Spleen:  Unremarkable  Adrenals/Urinary Tract: Normal adrenal glands. Kidneys are symmetric in size. No hydronephrosis. The urinary bladder is markedly distended.  Stomach/Bowel: The appendix is normal. No abnormal bowel wall thickening or evidence for bowel obstruction. No free fluid or free intraperitoneal air.  Vascular/Lymphatic: Normal caliber abdominal aorta.  No retroperitoneal lymphadenopathy.  Other: The uterus is unremarkable.  Musculoskeletal: No aggressive or acute appearing osseous lesions.  IMPRESSION: The urinary bladder is markedly distended. Recommend catheter decompression.  Otherwise no acute process within the abdomen or pelvis.  Hepatic steatosis.   Electronically Signed   By: Lovey Newcomer M.D.   On: 12/12/2014 13:25   Ct Lumbar Spine Wo Contrast  12/11/2014   CLINICAL DATA:  Tripped and fell onto tile floor at home today. No loss of consciousness. RIGHT hip pain. History of lumbar disc disease, arthritis.  EXAM: CT LUMBAR SPINE WITHOUT CONTRAST  TECHNIQUE: Multidetector CT imaging of the lumbar spine was performed without intravenous contrast administration. Multiplanar CT image reconstructions were also generated.  COMPARISON:  CT abdomen and pelvis Sep 01, 2014  FINDINGS: Lumbar vertebral bodies are intact and aligned with maintenance of the lumbar lordosis. Status post apparent LEFT L4-5 undersurface laminectomy. Mild L5-S1 disc height loss, stable from prior imaging. Scattered chronic Schmorl's nodes were present previously. No destructive bony lesions. Lumbar lordosis maintained.  Comminuted nondisplaced RIGHT sacral fracture, zone 1. No destructive bony lesions. Sacroiliac joints are intact, symmetric. Included soft tissues are normal.  IMPRESSION: Acute nondisplaced RIGHT sacral zone 1 fracture.  No acute lumbar spine fracture or malalignment.   Electronically Signed   By: Elon Alas M.D.   On: 12/11/2014 22:20   Dg Chest Port 1 View  12/16/2014   CLINICAL DATA:  Hypotension ; history hypertension  EXAM: PORTABLE CHEST - 1 VIEW  COMPARISON:  Portable exam 1827 hours compared to 12/13/2014  FINDINGS: Upper normal heart size.  Normal mediastinal contours and pulmonary vascularity  RIGHT basilar subsegmental atelectasis.  Questionable retrocardiac LEFT lower lobe infiltrate.  Remaining lungs clear.  No pleural effusion or pneumothorax.   IMPRESSION: Subsegmental atelectasis RIGHT base with question atelectasis versus infiltrate in retrocardiac LEFT lower lobe.   Electronically Signed   By: Lavonia Dana M.D.   On: 12/16/2014 18:39   Dg Chest Port 1 View  12/13/2014   CLINICAL DATA:  Shortness of breath, recent fall with back pain  EXAM: PORTABLE CHEST - 1 VIEW  COMPARISON:  12/12/2014  FINDINGS: The heart size and mediastinal contours are within normal limits. Both lungs are clear. The visualized skeletal structures are unremarkable.  IMPRESSION: No active disease.   Electronically Signed   By: Inez Catalina M.D.   On: 12/13/2014 12:52   Dg Chest Port 1 View  12/12/2014   CLINICAL DATA:  Sepsis.  EXAM: PORTABLE CHEST - 1 VIEW  COMPARISON:  CT chest October 15, 2014.  FINDINGS: The heart size and mediastinal contours are within normal limits.  Strandy densities LEFT lung base. No pleural effusion or focal consolidation. No pneumothorax. The visualized skeletal structures are unremarkable.  IMPRESSION: LEFT lung base atelectasis.   Electronically Signed   By: Elon Alas M.D.   On: 12/12/2014 03:56    Microbiology: Recent Results (from the past 240 hour(s))  Culture, blood (routine x 2)     Status: None   Collection Time: 12/12/14 12:25 AM  Result Value Ref Range Status   Specimen Description BLOOD LEFT ARM  Final   Special Requests BOTTLES DRAWN AEROBIC AND ANAEROBIC 5CC   Final   Culture NO GROWTH 5 DAYS  Final   Report Status 12/17/2014 FINAL  Final  Culture, blood (routine x 2)     Status: None   Collection Time: 12/12/14 12:35 AM  Result Value Ref Range Status   Specimen Description BLOOD LEFT HAND  Final   Special Requests BOTTLES DRAWN AEROBIC ONLY 5CC  Final   Culture NO GROWTH 5 DAYS  Final   Report Status 12/17/2014 FINAL  Final  MRSA PCR Screening     Status: None   Collection Time: 12/12/14  3:28 AM  Result Value Ref Range Status   MRSA by PCR NEGATIVE NEGATIVE Final    Comment:        The GeneXpert MRSA Assay  (FDA approved for NASAL specimens only), is one component of a comprehensive MRSA colonization surveillance program. It is not intended to diagnose MRSA infection nor to guide or monitor treatment for MRSA infections.   Culture, blood (routine x 2)     Status: None (Preliminary result)   Collection Time: 12/16/14  7:26 PM  Result Value Ref Range Status   Specimen Description BLOOD RIGHT HAND  Final   Special Requests IN PEDIATRIC BOTTLE 3CC  Final   Culture NO GROWTH 3 DAYS  Final   Report Status PENDING  Incomplete  Culture, Urine     Status: None   Collection Time: 12/16/14 10:40 PM  Result Value Ref Range Status   Specimen Description URINE, CLEAN CATCH  Final   Special Requests NONE  Final   Culture 7,000 COLONIES/mL INSIGNIFICANT GROWTH  Final   Report Status 12/17/2014 FINAL  Final  Culture, blood (routine x 2)     Status: None (Preliminary result)   Collection Time: 12/17/14 12:01 AM  Result Value Ref Range Status   Specimen Description BLOOD RIGHT FOOT  Final   Special Requests BOTTLES DRAWN AEROBIC AND ANAEROBIC 5CC  Final   Culture NO GROWTH 2 DAYS  Final   Report Status PENDING  Incomplete     Labs: Basic Metabolic Panel:  Recent Labs Lab 12/16/14 0536 12/16/14 2255 12/17/14 0526 12/18/14 0350 12/19/14 0627  NA 137 139 135 138 141  K 4.0 3.4* 3.6 4.3 3.7  CL 106 105 103 105 105  CO2 23 26 26 23 22   GLUCOSE 95 112* 119* 98 76  BUN 6 <5* <5* <5* <5*  CREATININE 0.52 0.50 0.56 0.54 0.51  CALCIUM 7.8* 8.2* 7.8* 8.7* 9.0   Liver Function Tests: No results for input(s): AST, ALT, ALKPHOS, BILITOT, PROT, ALBUMIN in the last 168 hours. No results for input(s): LIPASE, AMYLASE in the last 168 hours. No results for input(s): AMMONIA in the last 168 hours. CBC:  Recent Labs Lab 12/15/14 0513 12/16/14 0536 12/16/14 2255 12/17/14 0526 12/18/14 0350  WBC 10.3 8.3 10.0 9.4 9.1  NEUTROABS  --   --  7.8*  --   --   HGB  11.4* 9.6* 10.1* 9.0* 11.5*  HCT  35.1* 28.9* 30.3* 27.6* 34.5*  MCV 93.4 92.3 92.9 92.3 92.0  PLT 290 221 249 236 270   Cardiac Enzymes:  Recent Labs Lab 12/16/14 2255 12/17/14 0107 12/17/14 0526  TROPONINI <0.03 <0.03 <0.03   BNP: BNP (last 3 results) No results for input(s): BNP in the last 8760 hours.  ProBNP (last 3 results) No results for input(s): PROBNP in the last 8760 hours.  CBG: No results for input(s): GLUCAP in the last 168 hours.     SignedIrine Seal MD [facility] 12/20/2014, 10:51 AM

## 2014-12-21 LAB — CULTURE, BLOOD (ROUTINE X 2): CULTURE: NO GROWTH

## 2014-12-22 LAB — CULTURE, BLOOD (ROUTINE X 2): CULTURE: NO GROWTH

## 2015-01-01 ENCOUNTER — Encounter (HOSPITAL_COMMUNITY): Payer: Self-pay

## 2015-01-01 ENCOUNTER — Emergency Department (HOSPITAL_COMMUNITY): Payer: Medicaid Other

## 2015-01-01 ENCOUNTER — Inpatient Hospital Stay (HOSPITAL_COMMUNITY)
Admission: EM | Admit: 2015-01-01 | Discharge: 2015-01-03 | DRG: 872 | Disposition: A | Discharge status: Home / Self Care | Payer: Medicaid Other | Attending: Family Medicine | Admitting: Family Medicine

## 2015-01-01 DIAGNOSIS — R112 Nausea with vomiting, unspecified: Secondary | ICD-10-CM | POA: Diagnosis present

## 2015-01-01 DIAGNOSIS — G8929 Other chronic pain: Secondary | ICD-10-CM | POA: Diagnosis present

## 2015-01-01 DIAGNOSIS — S3210XA Unspecified fracture of sacrum, initial encounter for closed fracture: Secondary | ICD-10-CM | POA: Diagnosis present

## 2015-01-01 DIAGNOSIS — R109 Unspecified abdominal pain: Secondary | ICD-10-CM | POA: Diagnosis present

## 2015-01-01 DIAGNOSIS — E872 Acidosis: Secondary | ICD-10-CM | POA: Diagnosis present

## 2015-01-01 DIAGNOSIS — I10 Essential (primary) hypertension: Secondary | ICD-10-CM | POA: Diagnosis present

## 2015-01-01 DIAGNOSIS — R1084 Generalized abdominal pain: Secondary | ICD-10-CM

## 2015-01-01 DIAGNOSIS — F419 Anxiety disorder, unspecified: Secondary | ICD-10-CM | POA: Diagnosis present

## 2015-01-01 DIAGNOSIS — K529 Noninfective gastroenteritis and colitis, unspecified: Secondary | ICD-10-CM | POA: Diagnosis present

## 2015-01-01 DIAGNOSIS — Z7289 Other problems related to lifestyle: Secondary | ICD-10-CM | POA: Diagnosis not present

## 2015-01-01 DIAGNOSIS — M1990 Unspecified osteoarthritis, unspecified site: Secondary | ICD-10-CM | POA: Diagnosis present

## 2015-01-01 DIAGNOSIS — E739 Lactose intolerance, unspecified: Secondary | ICD-10-CM | POA: Diagnosis present

## 2015-01-01 DIAGNOSIS — A419 Sepsis, unspecified organism: Secondary | ICD-10-CM | POA: Diagnosis present

## 2015-01-01 DIAGNOSIS — Z79891 Long term (current) use of opiate analgesic: Secondary | ICD-10-CM

## 2015-01-01 DIAGNOSIS — B029 Zoster without complications: Secondary | ICD-10-CM | POA: Diagnosis present

## 2015-01-01 DIAGNOSIS — F4310 Post-traumatic stress disorder, unspecified: Secondary | ICD-10-CM | POA: Diagnosis present

## 2015-01-01 DIAGNOSIS — F329 Major depressive disorder, single episode, unspecified: Secondary | ICD-10-CM | POA: Diagnosis present

## 2015-01-01 DIAGNOSIS — Z85828 Personal history of other malignant neoplasm of skin: Secondary | ICD-10-CM | POA: Diagnosis not present

## 2015-01-01 DIAGNOSIS — R000 Tachycardia, unspecified: Secondary | ICD-10-CM | POA: Insufficient documentation

## 2015-01-01 DIAGNOSIS — L03113 Cellulitis of right upper limb: Secondary | ICD-10-CM | POA: Diagnosis present

## 2015-01-01 DIAGNOSIS — M7989 Other specified soft tissue disorders: Secondary | ICD-10-CM | POA: Diagnosis present

## 2015-01-01 DIAGNOSIS — Z79899 Other long term (current) drug therapy: Secondary | ICD-10-CM

## 2015-01-01 DIAGNOSIS — R609 Edema, unspecified: Secondary | ICD-10-CM

## 2015-01-01 DIAGNOSIS — E785 Hyperlipidemia, unspecified: Secondary | ICD-10-CM | POA: Diagnosis present

## 2015-01-01 DIAGNOSIS — E876 Hypokalemia: Secondary | ICD-10-CM | POA: Diagnosis present

## 2015-01-01 DIAGNOSIS — W010XXA Fall on same level from slipping, tripping and stumbling without subsequent striking against object, initial encounter: Secondary | ICD-10-CM | POA: Diagnosis present

## 2015-01-01 DIAGNOSIS — E869 Volume depletion, unspecified: Secondary | ICD-10-CM | POA: Diagnosis present

## 2015-01-01 DIAGNOSIS — L0390 Cellulitis, unspecified: Secondary | ICD-10-CM

## 2015-01-01 LAB — BLOOD GAS, VENOUS
Acid-base deficit: 5.3 mmol/L — ABNORMAL HIGH (ref 0.0–2.0)
Bicarbonate: 19.5 mEq/L — ABNORMAL LOW (ref 20.0–24.0)
O2 Saturation: 80.8 %
PATIENT TEMPERATURE: 98.6
PCO2 VEN: 37.3 mmHg — AB (ref 45.0–50.0)
PH VEN: 7.337 — AB (ref 7.250–7.300)
TCO2: 17.2 mmol/L (ref 0–100)
pO2, Ven: 46.8 mmHg — ABNORMAL HIGH (ref 30.0–45.0)

## 2015-01-01 LAB — I-STAT CHEM 8, ED
BUN: 6 mg/dL (ref 6–20)
Calcium, Ion: 1.11 mmol/L — ABNORMAL LOW (ref 1.12–1.23)
Chloride: 104 mmol/L (ref 101–111)
Creatinine, Ser: 0.7 mg/dL (ref 0.44–1.00)
Glucose, Bld: 114 mg/dL — ABNORMAL HIGH (ref 65–99)
HCT: 46 % (ref 36.0–46.0)
Hemoglobin: 15.6 g/dL — ABNORMAL HIGH (ref 12.0–15.0)
Potassium: 3.5 mmol/L (ref 3.5–5.1)
Sodium: 140 mmol/L (ref 135–145)
TCO2: 21 mmol/L (ref 0–100)

## 2015-01-01 LAB — I-STAT BETA HCG BLOOD, ED (MC, WL, AP ONLY)

## 2015-01-01 LAB — COMPREHENSIVE METABOLIC PANEL WITH GFR
ALT: 19 U/L (ref 14–54)
AST: 40 U/L (ref 15–41)
Albumin: 4.5 g/dL (ref 3.5–5.0)
Alkaline Phosphatase: 246 U/L — ABNORMAL HIGH (ref 38–126)
Anion gap: 16 — ABNORMAL HIGH (ref 5–15)
BUN: 7 mg/dL (ref 6–20)
CO2: 20 mmol/L — ABNORMAL LOW (ref 22–32)
Calcium: 9.5 mg/dL (ref 8.9–10.3)
Chloride: 104 mmol/L (ref 101–111)
Creatinine, Ser: 0.84 mg/dL (ref 0.44–1.00)
GFR calc Af Amer: >60 mL/min
GFR calc non Af Amer: >60 mL/min
Glucose, Bld: 114 mg/dL — ABNORMAL HIGH (ref 65–99)
Potassium: 3.6 mmol/L (ref 3.5–5.1)
Sodium: 140 mmol/L (ref 135–145)
Total Bilirubin: 0.4 mg/dL (ref 0.3–1.2)
Total Protein: 9.6 g/dL — ABNORMAL HIGH (ref 6.5–8.1)

## 2015-01-01 LAB — CBC WITH DIFFERENTIAL/PLATELET
Basophils Absolute: 0.1 K/uL (ref 0.0–0.1)
Basophils Relative: 0 % (ref 0–1)
Eosinophils Absolute: 0.1 K/uL (ref 0.0–0.7)
Eosinophils Relative: 0 % (ref 0–5)
HCT: 41.5 % (ref 36.0–46.0)
Hemoglobin: 13.9 g/dL (ref 12.0–15.0)
Lymphocytes Relative: 7 % — ABNORMAL LOW (ref 12–46)
Lymphs Abs: 1.6 K/uL (ref 0.7–4.0)
MCH: 30.8 pg (ref 26.0–34.0)
MCHC: 33.5 g/dL (ref 30.0–36.0)
MCV: 91.8 fL (ref 78.0–100.0)
Monocytes Absolute: 1.2 K/uL — ABNORMAL HIGH (ref 0.1–1.0)
Monocytes Relative: 5 % (ref 3–12)
Neutro Abs: 20.4 K/uL — ABNORMAL HIGH (ref 1.7–7.7)
Neutrophils Relative %: 88 % — ABNORMAL HIGH (ref 43–77)
Platelets: 538 K/uL — ABNORMAL HIGH (ref 150–400)
RBC: 4.52 MIL/uL (ref 3.87–5.11)
RDW: 13.8 % (ref 11.5–15.5)
WBC: 23.4 K/uL — ABNORMAL HIGH (ref 4.0–10.5)

## 2015-01-01 LAB — CBC
HCT: 34.6 % — ABNORMAL LOW (ref 36.0–46.0)
Hemoglobin: 11.2 g/dL — ABNORMAL LOW (ref 12.0–15.0)
MCH: 30.6 pg (ref 26.0–34.0)
MCHC: 32.4 g/dL (ref 30.0–36.0)
MCV: 94.5 fL (ref 78.0–100.0)
PLATELETS: 575 10*3/uL — AB (ref 150–400)
RBC: 3.66 MIL/uL — ABNORMAL LOW (ref 3.87–5.11)
RDW: 14.3 % (ref 11.5–15.5)
WBC: 22.9 10*3/uL — ABNORMAL HIGH (ref 4.0–10.5)

## 2015-01-01 LAB — I-STAT CG4 LACTIC ACID, ED
Lactic Acid, Venous: 5.63 mmol/L (ref 0.5–2.0)
Lactic Acid, Venous: 3.27 mmol/L (ref 0.5–2.0)

## 2015-01-01 LAB — LACTIC ACID, PLASMA: Lactic Acid, Venous: 4.8 mmol/L (ref 0.5–2.0)

## 2015-01-01 LAB — URINALYSIS, ROUTINE W REFLEX MICROSCOPIC
Bilirubin Urine: NEGATIVE
Glucose, UA: NEGATIVE mg/dL
Hgb urine dipstick: NEGATIVE
Ketones, ur: NEGATIVE mg/dL
Leukocytes, UA: NEGATIVE
Nitrite: NEGATIVE
Protein, ur: NEGATIVE mg/dL
Specific Gravity, Urine: 1.013 (ref 1.005–1.030)
Urobilinogen, UA: 0.2 mg/dL (ref 0.0–1.0)
pH: 5.5 (ref 5.0–8.0)

## 2015-01-01 LAB — PROCALCITONIN: Procalcitonin: <0.1 ng/mL

## 2015-01-01 LAB — CREATININE, SERUM
CREATININE: 0.78 mg/dL (ref 0.44–1.00)
GFR calc Af Amer: >60 mL/min (ref >60)

## 2015-01-01 LAB — TROPONIN I: Troponin I: 0.1 ng/mL — ABNORMAL HIGH (ref <0.031)

## 2015-01-01 LAB — MRSA PCR SCREENING: MRSA by PCR: NEGATIVE

## 2015-01-01 MED ORDER — HYDROMORPHONE HCL 1 MG/ML IJ SOLN
1.0000 mg | INTRAMUSCULAR | Status: DC | PRN
  Administered 2015-01-01: 1 mg via INTRAVENOUS
  Filled 2015-01-01 (×2): qty 1

## 2015-01-01 MED ORDER — SODIUM CHLORIDE 0.9 % IV SOLN
Freq: Once | INTRAVENOUS | Status: AC
  Administered 2015-01-01: 20:00:00 via INTRAVENOUS

## 2015-01-01 MED ORDER — HYDRALAZINE HCL 20 MG/ML IJ SOLN
10.0000 mg | INTRAMUSCULAR | Status: DC | PRN
  Filled 2015-01-01: qty 1

## 2015-01-01 MED ORDER — LORAZEPAM 2 MG/ML IJ SOLN
1.0000 mg | Freq: Once | INTRAMUSCULAR | Status: AC
  Administered 2015-01-01: 1 mg via INTRAVENOUS
  Filled 2015-01-01: qty 1

## 2015-01-01 MED ORDER — HYDROMORPHONE HCL 1 MG/ML IJ SOLN
1.0000 mg | Freq: Once | INTRAMUSCULAR | Status: AC
  Administered 2015-01-01: 1 mg via INTRAVENOUS
  Filled 2015-01-01: qty 1

## 2015-01-01 MED ORDER — PROMETHAZINE HCL 25 MG/ML IJ SOLN
6.2500 mg | INTRAMUSCULAR | Status: DC | PRN
  Administered 2015-01-01 – 2015-01-02 (×3): 6.25 mg via INTRAVENOUS
  Filled 2015-01-01 (×4): qty 1

## 2015-01-01 MED ORDER — SODIUM CHLORIDE 0.9 % IV BOLUS (SEPSIS): 1000.0000 mL | Freq: Once | INTRAVENOUS | Status: DC

## 2015-01-01 MED ORDER — PIPERACILLIN-TAZOBACTAM 3.375 G IVPB
3.3750 g | Freq: Three times a day (TID) | INTRAVENOUS | Status: DC
  Administered 2015-01-01 – 2015-01-03 (×6): 3.375 g via INTRAVENOUS
  Filled 2015-01-01 (×8): qty 50

## 2015-01-01 MED ORDER — PIPERACILLIN-TAZOBACTAM 3.375 G IVPB
3.3750 g | Freq: Once | INTRAVENOUS | Status: AC
  Administered 2015-01-01: 3.375 g via INTRAVENOUS
  Filled 2015-01-01: qty 50

## 2015-01-01 MED ORDER — SODIUM CHLORIDE 0.9 % IV BOLUS (SEPSIS)
1000.0000 mL | Freq: Once | INTRAVENOUS | Status: AC
  Administered 2015-01-01: 1000 mL via INTRAVENOUS

## 2015-01-01 MED ORDER — VANCOMYCIN HCL IN DEXTROSE 1-5 GM/200ML-% IV SOLN
1000.0000 mg | Freq: Once | INTRAVENOUS | Status: AC
  Administered 2015-01-01: 1000 mg via INTRAVENOUS
  Filled 2015-01-01: qty 200

## 2015-01-01 MED ORDER — FAMOTIDINE IN NACL 20-0.9 MG/50ML-% IV SOLN
20.0000 mg | Freq: Once | INTRAVENOUS | Status: AC
  Administered 2015-01-01: 20 mg via INTRAVENOUS
  Filled 2015-01-01: qty 50

## 2015-01-01 MED ORDER — HYDROMORPHONE HCL 1 MG/ML IJ SOLN
1.0000 mg | INTRAMUSCULAR | Status: DC | PRN
  Administered 2015-01-01 – 2015-01-03 (×13): 1 mg via INTRAVENOUS
  Filled 2015-01-01 (×12): qty 1

## 2015-01-01 MED ORDER — SODIUM CHLORIDE 0.9 % IV SOLN
INTRAVENOUS | Status: DC
  Administered 2015-01-01: 150 mL/h via INTRAVENOUS

## 2015-01-01 MED ORDER — SODIUM CHLORIDE 0.9 % IV BOLUS (SEPSIS)
2000.0000 mL | Freq: Once | INTRAVENOUS | Status: AC
Start: 2015-01-01 — End: 2015-01-01
  Administered 2015-01-01: 2000 mL via INTRAVENOUS

## 2015-01-01 MED ORDER — IOHEXOL 300 MG/ML  SOLN: 50.0000 mL | Freq: Once | INTRAMUSCULAR | Status: DC | PRN

## 2015-01-01 MED ORDER — VANCOMYCIN HCL IN DEXTROSE 1-5 GM/200ML-% IV SOLN: 1000.0000 mg | Freq: Two times a day (BID) | INTRAVENOUS | Status: DC

## 2015-01-01 MED ORDER — ENOXAPARIN SODIUM 40 MG/0.4ML ~~LOC~~ SOLN
40.0000 mg | SUBCUTANEOUS | Status: DC
  Administered 2015-01-01 – 2015-01-02 (×2): 40 mg via SUBCUTANEOUS
  Filled 2015-01-01 (×4): qty 0.4

## 2015-01-01 MED ORDER — FENTANYL CITRATE (PF) 100 MCG/2ML IJ SOLN
50.0000 ug | Freq: Once | INTRAMUSCULAR | Status: AC
  Administered 2015-01-01: 50 ug via INTRAMUSCULAR
  Filled 2015-01-01: qty 2

## 2015-01-01 MED ORDER — VANCOMYCIN HCL 10 G IV SOLR
1500.0000 mg | Freq: Two times a day (BID) | INTRAVENOUS | Status: DC
  Administered 2015-01-01 – 2015-01-03 (×4): 1500 mg via INTRAVENOUS
  Filled 2015-01-01 (×6): qty 1500

## 2015-01-01 MED ORDER — ONDANSETRON HCL 4 MG/2ML IJ SOLN: 4.0000 mg | Freq: Once | INTRAMUSCULAR | Status: DC

## 2015-01-01 MED ORDER — SODIUM CHLORIDE 0.9 % IV SOLN: 250.0000 mL | INTRAVENOUS | Status: DC | PRN

## 2015-01-01 MED ORDER — ONDANSETRON HCL 4 MG/2ML IJ SOLN
4.0000 mg | Freq: Four times a day (QID) | INTRAMUSCULAR | Status: DC | PRN
  Administered 2015-01-01 – 2015-01-03 (×5): 4 mg via INTRAVENOUS
  Filled 2015-01-01 (×8): qty 2

## 2015-01-01 MED ORDER — HYDROMORPHONE HCL 1 MG/ML IJ SOLN: 1.0000 mg | Freq: Once | INTRAMUSCULAR | Status: DC

## 2015-01-01 MED ORDER — ONDANSETRON HCL 4 MG/2ML IJ SOLN
4.0000 mg | Freq: Four times a day (QID) | INTRAMUSCULAR | Status: DC | PRN
  Administered 2015-01-01 – 2015-01-02 (×2): 4 mg via INTRAVENOUS

## 2015-01-01 MED ORDER — ONDANSETRON HCL 4 MG/2ML IJ SOLN
4.0000 mg | Freq: Once | INTRAMUSCULAR | Status: AC
  Administered 2015-01-01: 4 mg via INTRAMUSCULAR
  Filled 2015-01-01: qty 2

## 2015-01-01 MED ORDER — PIPERACILLIN-TAZOBACTAM 3.375 G IVPB: 3.3750 g | Freq: Four times a day (QID) | INTRAVENOUS | Status: DC

## 2015-01-01 MED ORDER — LORAZEPAM 2 MG/ML IJ SOLN
2.0000 mg | INTRAMUSCULAR | Status: DC | PRN
  Administered 2015-01-01 – 2015-01-02 (×4): 2 mg via INTRAVENOUS
  Filled 2015-01-01 (×4): qty 1

## 2015-01-01 MED ORDER — DIPHENHYDRAMINE HCL 50 MG/ML IJ SOLN
25.0000 mg | Freq: Once | INTRAMUSCULAR | Status: AC
  Administered 2015-01-01: 25 mg via INTRAVENOUS
  Filled 2015-01-01: qty 1

## 2015-01-01 MED ORDER — HYDRALAZINE HCL 20 MG/ML IJ SOLN
10.0000 mg | Freq: Once | INTRAMUSCULAR | Status: AC
  Administered 2015-01-01: 10 mg via INTRAVENOUS
  Filled 2015-01-01: qty 1

## 2015-01-01 MED ORDER — IOHEXOL 300 MG/ML  SOLN
100.0000 mL | Freq: Once | INTRAMUSCULAR | Status: AC | PRN
  Administered 2015-01-01: 100 mL via INTRAVENOUS

## 2015-01-01 NOTE — ED Notes (Signed)
Intensivist present

## 2015-01-01 NOTE — ED Notes (Signed)
Patient yelling and screaming loudly that she is having paininher abdomen, back and sacrum.

## 2015-01-01 NOTE — ED Notes (Signed)
Pt arrived via [facility], back pain, Vomiting, clear foamy emesis in small amts. "Don't like pain, like a child with pain" Pt states she broke her rt sacrum 2 weeks ago.

## 2015-01-01 NOTE — ED Notes (Signed)
EDPA notified that right hand had increased redness and now had swelling.

## 2015-01-01 NOTE — ED Notes (Addendum)
Pt c/o shooting pain due to fracturing her sacrum. Pt reports vomiting 8x in the past 24 hrs. Pt sts that she ran out of her valium and her doctor would not refill it. Pt sts she has been on valium for over a year and she ran out on Wednesday.

## 2015-01-01 NOTE — ED Notes (Signed)
RN OBTAINED ONE SET OF BLOOD CULTURES, UNABLE TO OBTAIN 2ND.

## 2015-01-01 NOTE — ED Notes (Signed)
Per EMS, Pt, from home, c/o back pain x 2 weeks and n/v x 3 days.  Pain score 10/10.  Pt reports sacral fracture x 2 weeks ago and reports she ran out of valium x 3 days ago.  Sts her MD will not refill her prescription.

## 2015-01-01 NOTE — H&P (Addendum)
PULMONARY / CRITICAL CARE MEDICINE   Name: [name] MRN: [id number] DOB: 1970/07/29    ADMISSION DATE:  01/01/2015 CONSULTATION DATE:  01/01/15   REFERRING MD :  ED  CHIEF COMPLAINT:  Nausea, vomiting, abd pain  INITIAL PRESENTATION: 44 year old sent 2 days history of intermittent diarrhea associated with nausea and vomiting and abdominal pain.  STUDIES:  CT of the abdomen (01/01/15) Right sacral fracture reidentified with associated presacral stranding/fluid. No new acute intra-abdominal or pelvic pathology.  SIGNIFICANT EVENTS:  HISTORY OF PRESENT ILLNESS:   44 year old with PTSD and depression, hypertension, hyperlipidemia, lipidemia. She had a recent fall with a sacral fracture. She's had multiple admissions this year in May July and August where she presents with nausea vomiting diarrhea and abdominal pain of unclear etiology. She has elevated lactate at presentation which usually resolves with fluids. No clear source of her abdominal pain or source has been identified in the past. She's had multiple CTs of the abdomen which were negative. CT of the chest which was negative for pulmonary embolism. Last discharge was on 8/29.  She states that she ran out off her Valium 2 days ago. Her PCP would not renew it. Started developing intermittent diarrhea. One day ago she developed severe nausea vomiting with reduced by mouth intake. Associated with diffuse abdominal pain, more intense in the suprapubic and lower quadrants. She also is on opiates and muscle relaxants as an outpatient states that she had been taking them on a regular basis. In ED she was found to have an elevated WBC count and a lactate of 5.6. CT of the abdomen and pelvis is benign with no acute pathology. So far she has received 4 L of IV fluid, 3 doses of Dilaudid 1 mg, fentanyl 50 mg once, hydralazine 10 mg once and then Zofran and Pepcid. But she still remains hypertensive and tachycardic and complaining of acute  abdominal pain.  PAST MEDICAL HISTORY :   has a past medical history of PTSD (post-traumatic stress disorder); Bulging lumbar disc; Depression; Hypertension; Hyperlipemia; Headache; DDD (degenerative disc disease), cervical; Arthritis; Chronic lower back pain; Anxiety; and Skin cancer.  has past surgical history that includes Ablation on endometriosis (2008) and Hemorrhoid surgery (~ 2002). Prior to Admission medications   Medication Sig Start Date End Date Taking? Authorizing Provider  amLODipine (NORVASC) 10 MG tablet Take 1 tablet (10 mg total) by mouth daily. 10/17/14  Yes Kelvin Cellar, MD  diazepam (VALIUM) 2 MG tablet Take 2 mg by mouth every 6 (six) hours as needed for anxiety.   Yes Historical Provider, MD  docusate sodium (COLACE) 100 MG capsule Take 1 capsule (100 mg total) by mouth 2 (two) times daily. 12/20/14  Yes Eugenie Filler, MD  DULoxetine (CYMBALTA) 30 MG capsule Take 1 capsule (30 mg total) by mouth daily. 12/20/14  Yes Eugenie Filler, MD  HYDROcodone-acetaminophen (NORCO) 10-325 MG per tablet Take 1 tablet by mouth every 6 (six) hours as needed for moderate pain.   Yes Historical Provider, MD  polyethylene glycol (MIRALAX / GLYCOLAX) packet Take 17 g by mouth daily. Hold if you develop diarrhea 12/20/14  Yes Eugenie Filler, MD  tiZANidine (ZANAFLEX) 4 MG tablet Take 1 tablet (4 mg total) by mouth 3 (three) times daily. 12/20/14  Yes Eugenie Filler, MD   Allergies  Allergen Reactions  . Lactose Intolerance (Gi) Nausea Only    FAMILY HISTORY:  has no family status information on file.  SOCIAL HISTORY:  reports that she  has never smoked. She has never used smokeless tobacco. She reports that she drinks alcohol. She reports that she does not use illicit drugs.  REVIEW OF SYSTEMS:   Review of Systems  Positive for intermittent diarrhea, nausea, vomiting and abdominal pain. Denies any fevers, chills, cough, sputum production No hempotysis, blood in stools or  hematemesis. Denies chest pain, palpitations.  All other ROS are negative.  SUBJECTIVE:   VITAL SIGNS: Temp:  [98.8 F (37.1 C)-100.4 F (38 C)] 100.4 F (38 C) (09/10 1901) Pulse Rate:  [112-153] 144 (09/10 1902) Resp:  [14-27] 27 (09/10 1902) BP: (108-173)/(81-108) 159/97 mmHg (09/10 1902) SpO2:  [95 %-99 %] 99 % (09/10 1902) HEMODYNAMICS:   VENTILATOR SETTINGS:   INTAKE / OUTPUT:  Intake/Output Summary (Last 24 hours) at 01/01/15 1920 Last data filed at 01/01/15 1900  Gross per 24 hour  Intake   3250 ml  Output   1800 ml  Net   1450 ml    PHYSICAL EXAMINATION: General:  Moderate distress,  Neuro:  Awake, alert, No focal deficits HEENT:  PERRLA, EOMI Cardiovascular:  Tachycardia, Regular rate. No MRG Lungs:  Clear antr Abdomen:  Soft, tenderness in lower quadrants, + BS. No guarding or rigidity Musculoskeletal:   Skin:  Eryhthema over the right elbow and right hand.   LABS:  CBC  Recent Labs Lab 01/01/15 1128 01/01/15 1141  WBC 23.4*  --   HGB 13.9 15.6*  HCT 41.5 46.0  PLT 538*  --    Coag's No results for input(s): APTT, INR in the last 168 hours. BMET  Recent Labs Lab 01/01/15 1128 01/01/15 1141  NA 140 140  K 3.6 3.5  CL 104 104  CO2 20*  --   BUN 7 6  CREATININE 0.84 0.70  GLUCOSE 114* 114*   Electrolytes  Recent Labs Lab 01/01/15 1128  CALCIUM 9.5   Sepsis Markers  Recent Labs Lab 01/01/15 1142 01/01/15 1331  LATICACIDVEN 5.63* 3.27*   ABG No results for input(s): PHART, PCO2ART, PO2ART in the last 168 hours. Liver Enzymes  Recent Labs Lab 01/01/15 1128  AST 40  ALT 19  ALKPHOS 246*  BILITOT 0.4  ALBUMIN 4.5   Cardiac Enzymes No results for input(s): TROPONINI, PROBNP in the last 168 hours. Glucose No results for input(s): GLUCAP in the last 168 hours.  Imaging Ct Abdomen Pelvis W Contrast  01/01/2015   CLINICAL DATA:  Back pain, patient reports sacral fracture 2 weeks ago  EXAM: CT ABDOMEN AND PELVIS WITH  CONTRAST  TECHNIQUE: Multidetector CT imaging of the abdomen and pelvis was performed using the standard protocol following bolus administration of intravenous contrast.  CONTRAST:  116mL OMNIPAQUE IOHEXOL 300 MG/ML  SOLN  COMPARISON:  CT abdomen/ pelvis 12/12/2014  FINDINGS: Lower chest:  Lung bases are clear.  No pleural effusion.  Hepatobiliary: Mild hepatic hypodensity which could indicate a degree of steatosis but no focal abnormality is seen. Gallbladder is normal.  Pancreas: Normal  Spleen: Normal  Adrenals/Urinary Tract: Adrenal glands appear normal. Kidneys are unremarkable. No hydroureteronephrosis. The bladder is distended but otherwise unremarkable.  Stomach/Bowel: Stomach and bowel appear normal.  Appendix is normal.  Vascular/Lymphatic: No aortic aneurysm.  No lymphadenopathy.  Other: Uterus and ovaries are normal.  No free air.  Musculoskeletal: Right sacral fractures reidentified with associated presacral stranding/fluid. This is not significantly changed since previously allowing for technique.  IMPRESSION: Right sacral fracture reidentified with associated presacral stranding/fluid. No new acute intra-abdominal or pelvic pathology.  Electronically Signed   By: Conchita Paris M.D.   On: 01/01/2015 15:08   Dg Chest Portable 1 View  01/01/2015   CLINICAL DATA:  Tachycardia, lactic acidosis, sepsis  EXAM: PORTABLE CHEST - 1 VIEW  COMPARISON:  12/16/2014  FINDINGS: Contour abnormality of the medial left diaphragm is a diaphragmatic hernia based on June 2016 chest CT.  Normal heart size and mediastinal contours. No acute infiltrate or edema. No effusion or pneumothorax. No acute osseous findings.  IMPRESSION: No acute finding or explanation for sepsis.   Electronically Signed   By: Monte Fantasia M.D.   On: 01/01/2015 13:06     ASSESSMENT / PLAN:  PULMONARY OETT A: No issues No resp compromise. P:     CARDIOVASCULAR CVL A: Tachycardia and hypertension I suspect this is from  agitation and pain. Likely also has volume depletion from N/V and diarrhea P:  EKG and troponins IVF hydration. Will bolus 1 more lt NS and start drip at 150cc/hr Lactate is improving. We will follow. Hydralazine PRN for elevated BP.   RENAL A:  Stable Making good urine P:   Montior.  GASTROINTESTINAL A:  Multiple episodes of acute Abd pain, N/V/D of unclear etiology. May have benzo withdrawal as she ran out of valium 2 days ago. May be a functional or psychogenic issue. ? Drug seeking behavior.  P:   Zoftran and Phenergan for Nausea IVF hydration as above. Dilaudid for pain Ativan  Check stool for ova/parasites, C.Diff Keep NPO Cosnider GI consult in AM.  HEMATOLOGIC A: Elevated WBC count.  No clear source of infection. P:  Monitor Sepsis plan as below.  INFECTIOUS A: SIRS without a clear source of infection. ? Cellulitis of right arm vs allergic reaction to meds- Erythema started after she was in the ED for a while.  P:   BCx2 9/10 UC  Sputum  Abx:  Vanco, start date 9/10, d Zosyn, start date 9/10, d  Will start empiric broad spectrum abx.  Follow erythema of the arm.  ICU procalcitonin protocol. Follow blood cultures.  ENDOCRINE A: No issues. P:   Check TSH, cortisol  NEUROLOGIC A: Agitated secondary to pain PTSD/Anxiety and depression P:   Benzos Opiates as above. Consider psychiatry consult.  Holding her cymbalata and zanaflex. Can restart once she is cleared for POs  FAMILY  - Updates:  - Inter-disciplinary family meet or Palliative Care meeting due by:  10/17  TODAY'S SUMMARY: Admit for N/V/D. SIRS without clear evidence of infection. Elevated lacate that is improving after fluids. Empiric coverage with Vanco/zosyn. May be drug seeking. PCP was trying to taper her benzos as an outpt.   Critical care time- 45 mins  Marshell Garfinkel MD [facility] Pager [phone] If no answer or after 3pm call:  [phone] 01/01/2015, 7:20 PM
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
## 2015-01-01 NOTE — ED Notes (Signed)
EDPA notified that the right hand had increased swelling and redness. Area marked with a skin marker. Right radial pulse pressent.

## 2015-01-01 NOTE — ED Notes (Signed)
Patient transported to CT 

## 2015-01-01 NOTE — ED Notes (Signed)
Results given to [facility]

## 2015-01-01 NOTE — ED Notes (Signed)
Unable to collect second set of blood cultures due to poor venous access.

## 2015-01-01 NOTE — ED Notes (Addendum)
CG4 results were shown to Dr. Venora Maples.Nurse aware

## 2015-01-01 NOTE — ED Provider Notes (Signed)
CSN: [id number]     Arrival date & time 01/01/15  1025 History   First MD Initiated Contact with [name] 01/01/15 1040     Chief Complaint  [name] presents with  . Back Pain  . Emesis     (Consider location/radiation/quality/duration/timing/severity/associated sxs/prior Treatment) HPI Comments: [name] is a  44 y.o F with a recent admission for sacral fracture, sepsis, shingles, on chronic Valium and Vicodin, who presents to the emergency department today complaining of nausea, vomiting, back pain. [name] was discharged on 8/29. [name] states that she has had persistent back pain since discharge for sacral fracture. Today [name] began experiencing intractable vomiting, chest pain and increased back pain so she decided to bring herself to the ED. [name] states that she takes Valium 2 mg 4 times a day but was unable to have medication refilled this Wednesday because her doctor was out of the office. Emesis is nonbloody and nonbilious. Denies fever, chills,leg swelling, shortness of breath, syncope. [name] had one episode of diarrhea yesterday.  [name] is a 44 y.o. female presenting with back pain and vomiting.  Back Pain Emesis   Past Medical History  Diagnosis Date  . PTSD (post-traumatic stress disorder)   . Bulging lumbar disc   . Depression   . Hypertension   . Hyperlipemia   . Headache     "weekly" (10/15/2014)  . DDD (degenerative disc disease), cervical   . Arthritis     "joints ache all over" (10/15/2014)  . Chronic lower back pain   . Anxiety   . Skin cancer     "had them cut off my arms; don't know what kind"   Past Surgical History  Procedure Laterality Date  . Ablation on endometriosis  2008  . Hemorrhoid surgery  ~ 2002   History reviewed. No pertinent family history. Social History  Substance Use Topics  . Smoking status: Never Smoker   . Smokeless tobacco: Never Used  . Alcohol Use: Yes     Comment: 10/15/2014 "I've drank before; nothing regular;  don't drink now cause of RX I'm on"   OB History    No data available     Review of Systems  Gastrointestinal: Positive for vomiting.  Musculoskeletal: Positive for back pain.      Allergies  Lactose intolerance (gi)  Home Medications   Prior to Admission medications   Medication Sig Start Date End Date Taking? Authorizing Provider  amLODipine (NORVASC) 10 MG tablet Take 1 tablet (10 mg total) by mouth daily. 10/17/14  Yes Kelvin Cellar, MD  diazepam (VALIUM) 2 MG tablet Take 2 mg by mouth every 6 (six) hours as needed for anxiety.   Yes Historical Provider, MD  docusate sodium (COLACE) 100 MG capsule Take 1 capsule (100 mg total) by mouth 2 (two) times daily. 12/20/14  Yes Eugenie Filler, MD  DULoxetine (CYMBALTA) 30 MG capsule Take 1 capsule (30 mg total) by mouth daily. 12/20/14  Yes Eugenie Filler, MD  HYDROcodone-acetaminophen (NORCO) 10-325 MG per tablet Take 1 tablet by mouth every 6 (six) hours as needed for moderate pain.   Yes Historical Provider, MD  polyethylene glycol (MIRALAX / GLYCOLAX) packet Take 17 g by mouth daily. Hold if you develop diarrhea 12/20/14  Yes Eugenie Filler, MD  tiZANidine (ZANAFLEX) 4 MG tablet Take 1 tablet (4 mg total) by mouth 3 (three) times daily. 12/20/14  Yes Eugenie Filler, MD   BP 108/81 mmHg  Pulse 149  Temp(Src) 99.5 F (37.5  C) (Oral)  Resp 20  SpO2 98%  LMP 11/23/2014 Physical Exam  Constitutional: She is oriented to person, place, and time. She appears well-developed and well-nourished. She appears distressed.  HENT:  Head: Normocephalic and atraumatic.  Mouth/Throat: Oropharynx is clear and moist. No oropharyngeal exudate.  Eyes: Conjunctivae and EOM are normal. Pupils are equal, round, and reactive to light. Right eye exhibits no discharge. Left eye exhibits no discharge. No scleral icterus.  Neck: Normal range of motion. Neck supple.  Cardiovascular: Regular rhythm, normal heart sounds and intact distal pulses.   Exam reveals no gallop and no friction rub.   No murmur heard. Tachycardic to 140s  Pulmonary/Chest: Effort normal and breath sounds normal. No respiratory distress. She has no wheezes. She has no rales. She exhibits no tenderness.  Abdominal: Soft. She exhibits no distension and no mass. There is tenderness ( diffuse TTP of abdomen). There is no rebound and no guarding.  Musculoskeletal: Normal range of motion. She exhibits edema and tenderness.  TTP of sacral spine  Right hand and wrist appear erythematous and swollen. Warm to touch. Redness spreading up wrist throughout stay in ED. Possible cellulitis.  Lymphadenopathy:    She has no cervical adenopathy.  Neurological: She is alert and oriented to person, place, and time.  Skin: Skin is warm. No rash noted. She is diaphoretic. No erythema. No pallor.  Psychiatric: She has a normal mood and affect. Her behavior is normal.  Nursing note and vitals reviewed.   ED Course  Procedures (including critical care time)  Dr. Venora Maples attempted to place an EJ IV without success  Ultrasound guided IV used, Dr. Venora Maples was successful in placing IV in right [facility].   Lactic acid 5.3  Given IV vanc and zosyn  Repeat Lactic acid is 3.2 after 3L IV bolus. Pt still tachycardic.    R hand swollen and erythematous.  Given total of 5L fluids  3:49 PM Spoke with ICU physician. Will admit to their service  Labs Review Labs Reviewed  COMPREHENSIVE METABOLIC PANEL - Abnormal; Notable for the following:    CO2 20 (*)    Glucose, Bld 114 (*)    Total Protein 9.6 (*)    Alkaline Phosphatase 246 (*)    Anion gap 16 (*)    All other components within normal limits  CBC WITH DIFFERENTIAL/PLATELET - Abnormal; Notable for the following:    WBC 23.4 (*)    Platelets 538 (*)    Neutrophils Relative % 88 (*)    Neutro Abs 20.4 (*)    Lymphocytes Relative 7 (*)    Monocytes Absolute 1.2 (*)    All other components within normal limits  BLOOD GAS, VENOUS  - Abnormal; Notable for the following:    pH, Ven 7.337 (*)    pCO2, Ven 37.3 (*)    pO2, Ven 46.8 (*)    Bicarbonate 19.5 (*)    Acid-base deficit 5.3 (*)    All other components within normal limits  I-STAT CHEM 8, ED - Abnormal; Notable for the following:    Glucose, Bld 114 (*)    Calcium, Ion 1.11 (*)    Hemoglobin 15.6 (*)    All other components within normal limits  I-STAT CG4 LACTIC ACID, ED - Abnormal; Notable for the following:    Lactic Acid, Venous 5.63 (*)    All other components within normal limits  I-STAT CG4 LACTIC ACID, ED - Abnormal; Notable for the following:    Lactic Acid, Venous  3.27 (*)    All other components within normal limits  CULTURE, BLOOD (ROUTINE X 2)  CULTURE, BLOOD (ROUTINE X 2)  URINALYSIS, ROUTINE W REFLEX MICROSCOPIC (NOT AT [facility])  I-STAT BETA HCG BLOOD, ED ([facility], [facility], [facility] ONLY)    Imaging Review Ct Abdomen Pelvis W Contrast  01/01/2015   CLINICAL DATA:  Back pain, [name] reports sacral fracture 2 weeks ago  EXAM: CT ABDOMEN AND PELVIS WITH CONTRAST  TECHNIQUE: Multidetector CT imaging of the abdomen and pelvis was performed using the standard protocol following bolus administration of intravenous contrast.  CONTRAST:  158mL OMNIPAQUE IOHEXOL 300 MG/ML  SOLN  COMPARISON:  CT abdomen/ pelvis 12/12/2014  FINDINGS: Lower chest:  Lung bases are clear.  No pleural effusion.  Hepatobiliary: Mild hepatic hypodensity which could indicate a degree of steatosis but no focal abnormality is seen. Gallbladder is normal.  Pancreas: Normal  Spleen: Normal  Adrenals/Urinary Tract: Adrenal glands appear normal. Kidneys are unremarkable. No hydroureteronephrosis. The bladder is distended but otherwise unremarkable.  Stomach/Bowel: Stomach and bowel appear normal.  Appendix is normal.  Vascular/Lymphatic: No aortic aneurysm.  No lymphadenopathy.  Other: Uterus and ovaries are normal.  No free air.  Musculoskeletal: Right sacral fractures reidentified with associated  presacral stranding/fluid. This is not significantly changed since previously allowing for technique.  IMPRESSION: Right sacral fracture reidentified with associated presacral stranding/fluid. No new acute intra-abdominal or pelvic pathology.   Electronically Signed   By: Conchita Paris M.D.   On: 01/01/2015 15:08   Dg Chest Portable 1 View  01/01/2015   CLINICAL DATA:  Tachycardia, lactic acidosis, sepsis  EXAM: PORTABLE CHEST - 1 VIEW  COMPARISON:  12/16/2014  FINDINGS: Contour abnormality of the medial left diaphragm is a diaphragmatic hernia based on June 2016 chest CT.  Normal heart size and mediastinal contours. No acute infiltrate or edema. No effusion or pneumothorax. No acute osseous findings.  IMPRESSION: No acute finding or explanation for sepsis.   Electronically Signed   By: Monte Fantasia M.D.   On: 01/01/2015 13:06   I have personally reviewed and evaluated these images and lab results as part of my medical decision-making.   EKG Interpretation   Date/Time:  Saturday January 01 2015 10:46:08 EDT Ventricular Rate:  149 PR Interval:  123 QRS Duration: 79 QT Interval:  278 QTC Calculation: 438 R Axis:   24 Text Interpretation:  Sinus tachycardia Paired ventricular premature  complexes Baseline wander in lead(s) V1 V5 No significant change was found  Confirmed by CAMPOS  MD, KEVIN ([id number]) on 01/01/2015 11:49:04 AM      MDM   Final diagnoses:  Sepsis, due to unspecified organism  Tachycardia    Pt seen for tachycardia, back pain, vomiting. Pt with WBC of 23.4, tachycardic. Will treat for sepsis. Given vanc and zosyn. C/o abd pain, will CT abd. Concern for dead gut causing increased lactic acid. CT abd neg. Erythematous and swollen RUE. Possible cellulitis as source of infection. Pt remained tachycardic after 5L fluids. Spoke with ICU physician who will admit to their service. Pt given several doses of pain meds and zofran.    [name] was discussed with and seen by Dr.  Venora Maples who agrees with the treatment plan.      Dondra [address], PA-C 01/01/15 [place], MD 01/01/15 [phone]

## 2015-01-01 NOTE — ED Notes (Signed)
RN STATES THEY NEED ULTRASOUND IV

## 2015-01-01 NOTE — ED Notes (Signed)
Intensivist at bedside.

## 2015-01-01 NOTE — ED Notes (Signed)
Dr. Venora Maples made aware that the right hand had increased redness and swelling. EDP stated to ice and elevate.

## 2015-01-01 NOTE — ED Provider Notes (Signed)
Patient care since Dr. Venora Maples. Patient awaiting critical care consult. She continues tachycardic. Blood pressure is elevated at 173/100. Lactic acid appears to be clearing with a peak of 5 now 3.2. Patient on fifth liter of normal saline. Hands and feet are erythematous  Critical saw and assumed care.   Pattricia Boss, MD 01/02/15 0002

## 2015-01-01 NOTE — ED Provider Notes (Signed)
2:10 PM HR 136, BP 173/106. Still with abdominal pain. Will obtain CT abd/pelvis. abx given. Will give additional 1 liter IVFs at this time. Will obtain rectal temp  Jola Schmidt, MD 01/01/15 [phone]

## 2015-01-01 NOTE — ED Notes (Signed)
Slight redness noted to the right hand and right elbow.

## 2015-01-01 NOTE — ED Notes (Signed)
RN starting IV and getting labs. Dr. Venora Maples at bedside.

## 2015-01-02 ENCOUNTER — Inpatient Hospital Stay (HOSPITAL_COMMUNITY): Payer: Medicaid Other

## 2015-01-02 DIAGNOSIS — R109 Unspecified abdominal pain: Secondary | ICD-10-CM

## 2015-01-02 DIAGNOSIS — M7989 Other specified soft tissue disorders: Secondary | ICD-10-CM

## 2015-01-02 LAB — PROCALCITONIN: Procalcitonin: <0.1 ng/mL

## 2015-01-02 LAB — CBC
HCT: 34.6 % — ABNORMAL LOW (ref 36.0–46.0)
Hemoglobin: 11.4 g/dL — ABNORMAL LOW (ref 12.0–15.0)
MCH: 30.6 pg (ref 26.0–34.0)
MCHC: 32.9 g/dL (ref 30.0–36.0)
MCV: 92.8 fL (ref 78.0–100.0)
PLATELETS: 433 10*3/uL — AB (ref 150–400)
RBC: 3.73 MIL/uL — ABNORMAL LOW (ref 3.87–5.11)
RDW: 14.4 % (ref 11.5–15.5)
WBC: 16.8 10*3/uL — AB (ref 4.0–10.5)

## 2015-01-02 LAB — BASIC METABOLIC PANEL
ANION GAP: 10 (ref 5–15)
CALCIUM: 7.7 mg/dL — AB (ref 8.9–10.3)
CO2: 21 mmol/L — ABNORMAL LOW (ref 22–32)
Chloride: 108 mmol/L (ref 101–111)
Creatinine, Ser: 0.47 mg/dL (ref 0.44–1.00)
GFR calc Af Amer: >60 mL/min (ref >60)
GLUCOSE: 117 mg/dL — AB (ref 65–99)
Potassium: 3.4 mmol/L — ABNORMAL LOW (ref 3.5–5.1)
Sodium: 139 mmol/L (ref 135–145)

## 2015-01-02 LAB — TROPONIN I: Troponin I: <0.03 ng/mL (ref <0.031)

## 2015-01-02 LAB — MAGNESIUM
MAGNESIUM: 2.9 mg/dL — AB (ref 1.7–2.4)
Magnesium: 1.4 mg/dL — ABNORMAL LOW (ref 1.7–2.4)

## 2015-01-02 LAB — PHOSPHORUS: Phosphorus: 2.4 mg/dL — ABNORMAL LOW (ref 2.5–4.6)

## 2015-01-02 LAB — GLUCOSE, CAPILLARY: GLUCOSE-CAPILLARY: 149 mg/dL — AB (ref 65–99)

## 2015-01-02 LAB — URIC ACID: Uric Acid, Serum: 2.2 mg/dL — ABNORMAL LOW (ref 2.3–6.6)

## 2015-01-02 LAB — LACTIC ACID, PLASMA: LACTIC ACID, VENOUS: 1.3 mmol/L (ref 0.5–2.0)

## 2015-01-02 MED ORDER — ACETAMINOPHEN 325 MG PO TABS: 650.0000 mg | ORAL_TABLET | Freq: Four times a day (QID) | ORAL | Status: DC | PRN

## 2015-01-02 MED ORDER — SODIUM CHLORIDE 0.9 % IV BOLUS (SEPSIS)
1000.0000 mL | Freq: Once | INTRAVENOUS | Status: AC
  Administered 2015-01-02: 1000 mL via INTRAVENOUS

## 2015-01-02 MED ORDER — CETYLPYRIDINIUM CHLORIDE 0.05 % MT LIQD
7.0000 mL | Freq: Two times a day (BID) | OROMUCOSAL | Status: DC
  Administered 2015-01-02 – 2015-01-03 (×3): 7 mL via OROMUCOSAL

## 2015-01-02 MED ORDER — HYDROCODONE-ACETAMINOPHEN 10-325 MG PO TABS: 1.0000 | ORAL_TABLET | Freq: Four times a day (QID) | ORAL | Status: DC | PRN

## 2015-01-02 MED ORDER — DIAZEPAM 2 MG PO TABS
2.0000 mg | ORAL_TABLET | Freq: Four times a day (QID) | ORAL | Status: DC | PRN
  Administered 2015-01-02 – 2015-01-03 (×2): 2 mg via ORAL
  Filled 2015-01-02 (×2): qty 1

## 2015-01-02 MED ORDER — AMLODIPINE BESYLATE 10 MG PO TABS
10.0000 mg | ORAL_TABLET | Freq: Every day | ORAL | Status: DC
  Administered 2015-01-02 – 2015-01-03 (×2): 10 mg via ORAL
  Filled 2015-01-02 (×2): qty 1

## 2015-01-02 MED ORDER — DULOXETINE HCL 30 MG PO CPEP
30.0000 mg | ORAL_CAPSULE | Freq: Every day | ORAL | Status: DC
  Administered 2015-01-02 – 2015-01-03 (×2): 30 mg via ORAL
  Filled 2015-01-02 (×2): qty 1

## 2015-01-02 MED ORDER — METOPROLOL TARTRATE 1 MG/ML IV SOLN
2.5000 mg | INTRAVENOUS | Status: DC | PRN
  Administered 2015-01-02 (×3): 2.5 mg via INTRAVENOUS
  Filled 2015-01-02 (×4): qty 5

## 2015-01-02 MED ORDER — SODIUM CHLORIDE 0.9 % IV SOLN
6.0000 g | Freq: Once | INTRAVENOUS | Status: AC
  Administered 2015-01-02: 6 g via INTRAVENOUS
  Filled 2015-01-02: qty 12

## 2015-01-02 MED ORDER — TIZANIDINE HCL 4 MG PO TABS
4.0000 mg | ORAL_TABLET | Freq: Three times a day (TID) | ORAL | Status: DC
  Administered 2015-01-02 – 2015-01-03 (×4): 4 mg via ORAL
  Filled 2015-01-02 (×5): qty 1

## 2015-01-02 NOTE — Progress Notes (Signed)
Utilization Review Completed.[name] T9/02/2015  

## 2015-01-02 NOTE — Progress Notes (Signed)
[facility] ADULT ICU REPLACEMENT PROTOCOL FOR AM LAB REPLACEMENT ONLY  The patient does apply for the [facility] Adult ICU Electrolyte Replacment Protocol based on the criteria listed below:   1. Is GFR >/= 40 ml/min? Yes.    Patient's GFR today is >60 2. Is urine output >/= 0.5 ml/kg/hr for the last 6 hours? Yes.   Patient's UOP is 3.5 ml/kg/hr 3. Is BUN < 60 mg/dL? Yes.    Patient's BUN today is <5 4. Abnormal electrolyte(s): mag 1.4 5. Ordered repletion with: per protocol 6. If a panic level lab has been reported, has the CCM MD in charge been notified? No..   Physician:    Ronda Fairly A 01/02/2015 6:56 AM

## 2015-01-02 NOTE — Progress Notes (Signed)
PCCM PROGRESS NOTE  ADMISSION DATE: 01/01/2015 REFERRING PROVIDER: ER  CC: Abdominal pain.  SUBJECTIVE: C/o Rt hand swelling and lower abdominal discomfort.  OBJECTIVE: Temp:  [98.6 F (37 C)-100.4 F (38 C)] 99.2 F (37.3 C) (09/11 0800) Pulse Rate:  [112-153] 117 (09/11 1000) Resp:  [9-27] 10 (09/11 1000) BP: (133-185)/(75-131) 173/105 mmHg (09/11 1000) SpO2:  [94 %-100 %] 95 % (09/11 1000) Weight:  [171 lb 4.8 oz (77.7 kg)] 171 lb 4.8 oz (77.7 kg) (09/11 0400)   General: pleasant HEENT: no sinus tenderness Cardiac: regular, tachycardic Chest: no wheeze Abd: soft, mild lower abdominal tenderness Ext: Rt dorsum of hand swollen w/o erythema Neuro: normal strength Skin: no rashes   CMP Latest Ref Rng 01/02/2015 01/01/2015 01/01/2015  Glucose 65 - 99 mg/dL 117(H) - 114(H)  BUN 6 - 20 mg/dL <5(L) - 6  Creatinine 0.44 - 1.00 mg/dL 0.47 0.78 0.70  Sodium 135 - 145 mmol/L 139 - 140  Potassium 3.5 - 5.1 mmol/L 3.4(L) - 3.5  Chloride 101 - 111 mmol/L 108 - 104  CO2 22 - 32 mmol/L 21(L) - -  Calcium 8.9 - 10.3 mg/dL 7.7(L) - -  Total Protein 6.5 - 8.1 g/dL - - -  Total Bilirubin 0.3 - 1.2 mg/dL - - -  Alkaline Phos 38 - 126 U/L - - -  AST 15 - 41 U/L - - -  ALT 14 - 54 U/L - - -     CBC Latest Ref Rng 01/02/2015 01/01/2015 01/01/2015  WBC 4.0 - 10.5 K/uL 16.8(H) 22.9(H) -  Hemoglobin 12.0 - 15.0 g/dL 11.4(L) 11.2(L) 15.6(H)  Hematocrit 36.0 - 46.0 % 34.6(L) 34.6(L) 46.0  Platelets 150 - 400 K/uL 433(H) 575(H) -     Ct Abdomen Pelvis W Contrast  01/01/2015   CLINICAL DATA:  Back pain, patient reports sacral fracture 2 weeks ago  EXAM: CT ABDOMEN AND PELVIS WITH CONTRAST  TECHNIQUE: Multidetector CT imaging of the abdomen and pelvis was performed using the standard protocol following bolus administration of intravenous contrast.  CONTRAST:  122mL OMNIPAQUE IOHEXOL 300 MG/ML  SOLN  COMPARISON:  CT abdomen/ pelvis 12/12/2014  FINDINGS: Lower chest:  Lung bases are clear.  No  pleural effusion.  Hepatobiliary: Mild hepatic hypodensity which could indicate a degree of steatosis but no focal abnormality is seen. Gallbladder is normal.  Pancreas: Normal  Spleen: Normal  Adrenals/Urinary Tract: Adrenal glands appear normal. Kidneys are unremarkable. No hydroureteronephrosis. The bladder is distended but otherwise unremarkable.  Stomach/Bowel: Stomach and bowel appear normal.  Appendix is normal.  Vascular/Lymphatic: No aortic aneurysm.  No lymphadenopathy.  Other: Uterus and ovaries are normal.  No free air.  Musculoskeletal: Right sacral fractures reidentified with associated presacral stranding/fluid. This is not significantly changed since previously allowing for technique.  IMPRESSION: Right sacral fracture reidentified with associated presacral stranding/fluid. No new acute intra-abdominal or pelvic pathology.   Electronically Signed   By: Conchita Paris M.D.   On: 01/01/2015 15:08   Dg Chest Portable 1 View  01/01/2015   CLINICAL DATA:  Tachycardia, lactic acidosis, sepsis  EXAM: PORTABLE CHEST - 1 VIEW  COMPARISON:  12/16/2014  FINDINGS: Contour abnormality of the medial left diaphragm is a diaphragmatic hernia based on June 2016 chest CT.  Normal heart size and mediastinal contours. No acute infiltrate or edema. No effusion or pneumothorax. No acute osseous findings.  IMPRESSION: No acute finding or explanation for sepsis.   Electronically Signed   By: Neva Seat.D.  On: 01/01/2015 13:06    Urinalysis    Component Value Date/Time   COLORURINE YELLOW 01/01/2015 1315   APPEARANCEUR CLEAR 01/01/2015 1315   LABSPEC 1.013 01/01/2015 1315   PHURINE 5.5 01/01/2015 1315   GLUCOSEU NEGATIVE 01/01/2015 1315   HGBUR NEGATIVE 01/01/2015 1315   BILIRUBINUR NEGATIVE 01/01/2015 1315   KETONESUR NEGATIVE 01/01/2015 1315   PROTEINUR NEGATIVE 01/01/2015 1315   UROBILINOGEN 0.2 01/01/2015 1315   NITRITE NEGATIVE 01/01/2015 1315   LEUKOCYTESUR NEGATIVE 01/01/2015 1315      CULTURES: Blood 9/10 >> Stool O&P 9/10 >> C diff 9/10 >>  STUDIES: 9/10 CT abd/pelvis >> Rt sacral fracture  EVENTS: 9/10 Admit 9/11 To floor bed  DISCUSSION: 44 yo female presented with N/V and abdominal pain.  She has multiple admissions for same.  She also had admission for rt sacral fracture in August 2016.  She was noted to have leukocytosis and elevated lactic acid, so admitted for sepsis w/o clear source but ?Rt hand cellulitis and/or gastroenteritis.  Lactic acid has since cleared.  Procalcitonin was < 0.10.  ASSESSMENT/PLAN:  Elevated lactic acid, leukocytosis with concern for sepsis, ?Rt arm cellulitis, ?gastroenteritis. Plan: - Day 2 vancomycin/zosyn >> if cx's negative then likely can d/c Abx soon - f/u Rt hand xray  Hypomagnesemia. Plan: - f/u electrolytes  Recent Rt sacral fx. Plan: - prn pain meds, zanaflex  Hx of PTSD, anxiety, depression. Plan: - cymbalta  Hx of HTN. Plan: - norvasc  Transfer to floor 9/11.  To [facility] 9/12 and PCCM off.  Chesley Mires, MD [facility] Pulmonary/Critical Care 01/02/2015, 12:13 PM Pager:  [phone] After 3pm call: [phone]

## 2015-01-02 NOTE — Progress Notes (Addendum)
eLink Physician-Brief Progress Note Patient Name: [name] DOB: [birth year] MRN: [id number]   Date of Service  01/02/2015  HPI/Events of Note  Multiple issues: 1. Knees and ankles erythematous. 2. HTN - BP = 161/84, 3. HR = 138 (sinus tachycardia) and Lactic Acid = 4.8  eICU Interventions  Will order: 1. Uric acid level now.  2. Metoprolol 2.5 mg IV Q 3 hours PRN SBP > 160 or DBP > 100. 3. 0.9 NaCl 1 liter IV over 1 hour now.      Intervention Category Intermediate Interventions: Arrhythmia - evaluation and management;Hypertension - evaluation and management  Sommer,Steven Eugene 01/02/2015, 12:12 AM

## 2015-01-02 NOTE — Progress Notes (Signed)
eLink Physician-Brief Progress Note Patient Name: [name] DOB: Mar 19, 1971 MRN: [id number]   Date of Service  01/02/2015  HPI/Events of Note  Patient was meant to transfer, but orders not written Stable through camera check and chart review  eICU Interventions  Transfer orders written     Intervention Category Minor Interventions: Routine modifications to care plan (e.g. PRN medications for pain, fever)  MCQUAID, DOUGLAS 01/02/2015, 4:35 PM

## 2015-01-03 DIAGNOSIS — L03113 Cellulitis of right upper limb: Secondary | ICD-10-CM

## 2015-01-03 DIAGNOSIS — A419 Sepsis, unspecified organism: Principal | ICD-10-CM

## 2015-01-03 DIAGNOSIS — R112 Nausea with vomiting, unspecified: Secondary | ICD-10-CM

## 2015-01-03 DIAGNOSIS — R000 Tachycardia, unspecified: Secondary | ICD-10-CM | POA: Insufficient documentation

## 2015-01-03 DIAGNOSIS — L0390 Cellulitis, unspecified: Secondary | ICD-10-CM

## 2015-01-03 LAB — BASIC METABOLIC PANEL
ANION GAP: 9 (ref 5–15)
ANION GAP: 7 (ref 5–15)
BUN: <5 mg/dL — ABNORMAL LOW (ref 6–20)
BUN: <5 mg/dL — ABNORMAL LOW (ref 6–20)
CHLORIDE: 105 mmol/L (ref 101–111)
CHLORIDE: 105 mmol/L (ref 101–111)
CO2: 26 mmol/L (ref 22–32)
CO2: 27 mmol/L (ref 22–32)
Calcium: 8.3 mg/dL — ABNORMAL LOW (ref 8.9–10.3)
Calcium: 8.5 mg/dL — ABNORMAL LOW (ref 8.9–10.3)
Creatinine, Ser: 0.5 mg/dL (ref 0.44–1.00)
Creatinine, Ser: 0.56 mg/dL (ref 0.44–1.00)
Glucose, Bld: 122 mg/dL — ABNORMAL HIGH (ref 65–99)
Glucose, Bld: 113 mg/dL — ABNORMAL HIGH (ref 65–99)
POTASSIUM: 3.2 mmol/L — AB (ref 3.5–5.1)
POTASSIUM: 3.4 mmol/L — AB (ref 3.5–5.1)
SODIUM: 140 mmol/L (ref 135–145)
SODIUM: 139 mmol/L (ref 135–145)

## 2015-01-03 LAB — CBC
HCT: 34.9 % — ABNORMAL LOW (ref 36.0–46.0)
HEMOGLOBIN: 11.4 g/dL — AB (ref 12.0–15.0)
MCH: 30.7 pg (ref 26.0–34.0)
MCHC: 32.7 g/dL (ref 30.0–36.0)
MCV: 94.1 fL (ref 78.0–100.0)
PLATELETS: 422 10*3/uL — AB (ref 150–400)
RBC: 3.71 MIL/uL — AB (ref 3.87–5.11)
RDW: 14.9 % (ref 11.5–15.5)
WBC: 7.8 10*3/uL (ref 4.0–10.5)

## 2015-01-03 LAB — MAGNESIUM: Magnesium: 2.5 mg/dL — ABNORMAL HIGH (ref 1.7–2.4)

## 2015-01-03 LAB — PROCALCITONIN

## 2015-01-03 MED ORDER — DIAZEPAM 2 MG PO TABS: 2.0000 mg | ORAL_TABLET | Freq: Four times a day (QID) | ORAL | Status: DC | PRN

## 2015-01-03 MED ORDER — ONDANSETRON 4 MG PO TBDP: 4.0000 mg | ORAL_TABLET | Freq: Three times a day (TID) | ORAL | Status: DC | PRN

## 2015-01-03 MED ORDER — POTASSIUM CHLORIDE CRYS ER 20 MEQ PO TBCR
40.0000 meq | EXTENDED_RELEASE_TABLET | Freq: Once | ORAL | Status: AC
  Administered 2015-01-03: 40 meq via ORAL
  Filled 2015-01-03: qty 2

## 2015-01-03 MED ORDER — POTASSIUM CHLORIDE 10 MEQ/100ML IV SOLN
10.0000 meq | INTRAVENOUS | Status: AC
  Administered 2015-01-03 (×3): 10 meq via INTRAVENOUS
  Filled 2015-01-03 (×3): qty 100

## 2015-01-03 MED ORDER — SULFAMETHOXAZOLE-TRIMETHOPRIM 800-160 MG PO TABS: 1.0000 | ORAL_TABLET | Freq: Two times a day (BID) | ORAL | Status: DC

## 2015-01-03 NOTE — Progress Notes (Signed)
Reviewed all discharge education, information and medications. Patient stated understanding. No s/s of acute distress. Patient being discharged to home. IV and oral Potassium given by MDs orders. Will continue to monitor until patients ride has arrived.

## 2015-01-03 NOTE — Discharge Summary (Addendum)
Physician Discharge Summary  [name] [id number] DOB: [birth year] DOA: 01/01/2015  PCP: Leonard Downing, MD  Admit date: 01/01/2015 Discharge date: 01/03/2015  Time spent: *25 minutes  Recommendations for Outpatient Follow-up:  1. *Follow up PCP in 2 weeks 2.  Discharge Diagnoses:  Active Problems:   Abdominal pain   Discharge Condition: stable   Diet recommendation: regular diet   Filed Weights   01/01/15 2200 01/02/15 0400 01/02/15 2020  Weight: 77.7 kg (171 lb 4.8 oz) 77.7 kg (171 lb 4.8 oz) 79.1 kg (174 lb 6.1 oz)    History of present illness:  44 y.o. female with a history of HTN, Hyperlipidemia, DD, Chronic Pain, and Anxiety who was brought to the ED after she tripped and fell and landed on her lower back. She was found to have a non-displaced Right Sacral fracture. While in the ED she developed fever, and tachycardia, and nausea and vomiting while in the ED and was found to have a lactic Acidosis of 4.27. A Sepsis Workup was initiated and she was administered IV Fluids and Given empiric Antibiotics of IV Vancomycin and Zosyn and referred for admission. She was also given 1 dose of IV Acyclovir for her Shingles outbreak that started 3 days ago   Hospital Course:   Elevated lactic acid, leukocytosis with concern for sepsis, ?Rt arm cellulitis, ?gastroenteritis. Patient was star on vancomycin and Zosyn, blood cultures to date are negative At this time patient is afebrile, white count is back normal 7.8, right hand swelling and erythema has resolved. She will be discharged home on Bactrim DS 1 tablet by mouth twice a day   for 5 more days  Right hand x-ray showed no acute abnormality  Hypomagnesemia. Replaced, last magnesium 2.5   Recent Rt sacral fx. Continue when necessary Vicodin, Zanaflex  Hx of PTSD, anxiety, depression. Continue Cymbalta patient takes Valium 5 mg by mouth every 6 hours when necessary We'll give her 10 tablets of Valium as per  patient request She'll follow up with her mental health clinic  Hypokalemia Potassium was replaced today, repeat potassium this afternoon 3.4 Will give 1 dose of Kdur 40 mg by mouth 1 before discharge.  Hx of HTN. Continue Norvasc   Nausea and vomiting Resolved, will discharge patient home on Zofran when necessary   Procedures:  *None   Consultations:  CCM   Discharge Exam: Filed Vitals:   01/03/15 1458  BP: 128/82  Pulse: 108  Temp: 98.1 F (36.7 C)  Resp: 18    General: Appears in no acute distress  Cardiovascular: S1-S2 regular Respiratory - lungs clear bilaterally   Discharge Instructions   Discharge Instructions    Diet - low sodium heart healthy    Complete by:  As directed      Increase activity slowly    Complete by:  As directed           Current Discharge Medication List    START taking these medications   Details  ondansetron (ZOFRAN ODT) 4 MG disintegrating tablet Take 1 tablet (4 mg total) by mouth every 8 (eight) hours as needed for nausea or vomiting. Qty: 20 tablet, Refills: 0    sulfamethoxazole-trimethoprim (BACTRIM DS,SEPTRA DS) 800-160 MG per tablet Take 1 tablet by mouth 2 (two) times daily. Qty: 10 tablet, Refills: 0      CONTINUE these medications which have CHANGED   Details  diazepam (VALIUM) 2 MG tablet Take 1 tablet (2 mg total) by mouth every 6 (six) hours  as needed for anxiety. Qty: 10 tablet, Refills: 0      CONTINUE these medications which have NOT CHANGED   Details  amLODipine (NORVASC) 10 MG tablet Take 1 tablet (10 mg total) by mouth daily. Qty: 30 tablet, Refills: 2    docusate sodium (COLACE) 100 MG capsule Take 1 capsule (100 mg total) by mouth 2 (two) times daily. Qty: 10 capsule, Refills: 0    DULoxetine (CYMBALTA) 30 MG capsule Take 1 capsule (30 mg total) by mouth daily. Qty: 30 capsule, Refills: 0    HYDROcodone-acetaminophen (NORCO) 10-325 MG per tablet Take 1 tablet by mouth every 6 (six) hours as  needed for moderate pain.    polyethylene glycol (MIRALAX / GLYCOLAX) packet Take 17 g by mouth daily. Hold if you develop diarrhea Qty: 14 each, Refills: 0    tiZANidine (ZANAFLEX) 4 MG tablet Take 1 tablet (4 mg total) by mouth 3 (three) times daily. Qty: 1 tablet, Refills: 0       Allergies  Allergen Reactions  . Lactose Intolerance (Gi) Nausea Only      The results of significant diagnostics from this hospitalization (including imaging, microbiology, ancillary and laboratory) are listed below for reference.    Significant Diagnostic Studies: Ct Abdomen Pelvis Wo Contrast  12/12/2014   CLINICAL DATA:  Patient with severe abdominal pain, vomiting and leukocytosis.  EXAM: CT ABDOMEN AND PELVIS WITHOUT CONTRAST  TECHNIQUE: Multidetector CT imaging of the abdomen and pelvis was performed following the standard protocol without IV contrast.  COMPARISON:  CT abdomen pelvis 10/15/2014  FINDINGS: Lower chest: Normal heart size. Dependent atelectasis within the bilateral lower lobes. No pleural effusion.  Hepatobiliary: Liver is diffusely low in attenuation compatible with hepatic steatosis. Fatty sparing adjacent to the gallbladder fossa. Gallbladder is unremarkable. No intrahepatic or extrahepatic biliary ductal dilatation.  Pancreas: Unremarkable  Spleen:  Unremarkable  Adrenals/Urinary Tract: Normal adrenal glands. Kidneys are symmetric in size. No hydronephrosis. The urinary bladder is markedly distended.  Stomach/Bowel: The appendix is normal. No abnormal bowel wall thickening or evidence for bowel obstruction. No free fluid or free intraperitoneal air.  Vascular/Lymphatic: Normal caliber abdominal aorta. No retroperitoneal lymphadenopathy.  Other: The uterus is unremarkable.  Musculoskeletal: No aggressive or acute appearing osseous lesions.  IMPRESSION: The urinary bladder is markedly distended. Recommend catheter decompression.  Otherwise no acute process within the abdomen or pelvis.   Hepatic steatosis.   Electronically Signed   By: Lovey Newcomer M.D.   On: 12/12/2014 13:25   Ct Lumbar Spine Wo Contrast  12/11/2014   CLINICAL DATA:  Tripped and fell onto tile floor at home today. No loss of consciousness. RIGHT hip pain. History of lumbar disc disease, arthritis.  EXAM: CT LUMBAR SPINE WITHOUT CONTRAST  TECHNIQUE: Multidetector CT imaging of the lumbar spine was performed without intravenous contrast administration. Multiplanar CT image reconstructions were also generated.  COMPARISON:  CT abdomen and pelvis Sep 01, 2014  FINDINGS: Lumbar vertebral bodies are intact and aligned with maintenance of the lumbar lordosis. Status post apparent LEFT L4-5 undersurface laminectomy. Mild L5-S1 disc height loss, stable from prior imaging. Scattered chronic Schmorl's nodes were present previously. No destructive bony lesions. Lumbar lordosis maintained.  Comminuted nondisplaced RIGHT sacral fracture, zone 1. No destructive bony lesions. Sacroiliac joints are intact, symmetric. Included soft tissues are normal.  IMPRESSION: Acute nondisplaced RIGHT sacral zone 1 fracture.  No acute lumbar spine fracture or malalignment.   Electronically Signed   By: Elon Alas M.D.   On:  12/11/2014 22:20   Ct Abdomen Pelvis W Contrast  01/01/2015   CLINICAL DATA:  Back pain, patient reports sacral fracture 2 weeks ago  EXAM: CT ABDOMEN AND PELVIS WITH CONTRAST  TECHNIQUE: Multidetector CT imaging of the abdomen and pelvis was performed using the standard protocol following bolus administration of intravenous contrast.  CONTRAST:  164mL OMNIPAQUE IOHEXOL 300 MG/ML  SOLN  COMPARISON:  CT abdomen/ pelvis 12/12/2014  FINDINGS: Lower chest:  Lung bases are clear.  No pleural effusion.  Hepatobiliary: Mild hepatic hypodensity which could indicate a degree of steatosis but no focal abnormality is seen. Gallbladder is normal.  Pancreas: Normal  Spleen: Normal  Adrenals/Urinary Tract: Adrenal glands appear normal. Kidneys  are unremarkable. No hydroureteronephrosis. The bladder is distended but otherwise unremarkable.  Stomach/Bowel: Stomach and bowel appear normal.  Appendix is normal.  Vascular/Lymphatic: No aortic aneurysm.  No lymphadenopathy.  Other: Uterus and ovaries are normal.  No free air.  Musculoskeletal: Right sacral fractures reidentified with associated presacral stranding/fluid. This is not significantly changed since previously allowing for technique.  IMPRESSION: Right sacral fracture reidentified with associated presacral stranding/fluid. No new acute intra-abdominal or pelvic pathology.   Electronically Signed   By: Conchita Paris M.D.   On: 01/01/2015 15:08   Dg Chest Portable 1 View  01/01/2015   CLINICAL DATA:  Tachycardia, lactic acidosis, sepsis  EXAM: PORTABLE CHEST - 1 VIEW  COMPARISON:  12/16/2014  FINDINGS: Contour abnormality of the medial left diaphragm is a diaphragmatic hernia based on June 2016 chest CT.  Normal heart size and mediastinal contours. No acute infiltrate or edema. No effusion or pneumothorax. No acute osseous findings.  IMPRESSION: No acute finding or explanation for sepsis.   Electronically Signed   By: Monte Fantasia M.D.   On: 01/01/2015 13:06   Dg Chest Port 1 View  12/16/2014   CLINICAL DATA:  Hypotension ; history hypertension  EXAM: PORTABLE CHEST - 1 VIEW  COMPARISON:  Portable exam 1827 hours compared to 12/13/2014  FINDINGS: Upper normal heart size.  Normal mediastinal contours and pulmonary vascularity  RIGHT basilar subsegmental atelectasis.  Questionable retrocardiac LEFT lower lobe infiltrate.  Remaining lungs clear.  No pleural effusion or pneumothorax.  IMPRESSION: Subsegmental atelectasis RIGHT base with question atelectasis versus infiltrate in retrocardiac LEFT lower lobe.   Electronically Signed   By: Lavonia Dana M.D.   On: 12/16/2014 18:39   Dg Chest Port 1 View  12/13/2014   CLINICAL DATA:  Shortness of breath, recent fall with back pain  EXAM: PORTABLE  CHEST - 1 VIEW  COMPARISON:  12/12/2014  FINDINGS: The heart size and mediastinal contours are within normal limits. Both lungs are clear. The visualized skeletal structures are unremarkable.  IMPRESSION: No active disease.   Electronically Signed   By: Inez Catalina M.D.   On: 12/13/2014 12:52   Dg Chest Port 1 View  12/12/2014   CLINICAL DATA:  Sepsis.  EXAM: PORTABLE CHEST - 1 VIEW  COMPARISON:  CT chest October 15, 2014.  FINDINGS: The heart size and mediastinal contours are within normal limits. Strandy densities LEFT lung base. No pleural effusion or focal consolidation. No pneumothorax. The visualized skeletal structures are unremarkable.  IMPRESSION: LEFT lung base atelectasis.   Electronically Signed   By: Elon Alas M.D.   On: 12/12/2014 03:56   Dg Hand Complete Right  01/02/2015   CLINICAL DATA:  Acute right hand swelling without known injury. Initial encounter.  EXAM: RIGHT HAND - COMPLETE 3+ VIEW  COMPARISON:  None.  FINDINGS: There is no evidence of fracture or dislocation. There is no evidence of arthropathy or other focal bone abnormality. Soft tissues are unremarkable.  IMPRESSION: Normal right hand.   Electronically Signed   By: Marijo Conception, M.D.   On: 01/02/2015 14:27    Microbiology: Recent Results (from the past 240 hour(s))  Blood culture (routine x 2)     Status: None (Preliminary result)   Collection Time: 01/01/15 12:05 PM  Result Value Ref Range Status   Specimen Description BLOOD RIGHT ANTECUBITAL  Final   Special Requests BOTTLES DRAWN AEROBIC AND ANAEROBIC 5CC  Final   Culture   Final    NO GROWTH 2 DAYS Performed at [facility]    Report Status PENDING  Incomplete  Blood culture (routine x 2)     Status: None (Preliminary result)   Collection Time: 01/01/15  1:15 PM  Result Value Ref Range Status   Specimen Description BLOOD LEFT ANTECUBITAL  Final   Special Requests BOTTLES DRAWN AEROBIC ONLY 5CC  Final   Culture   Final    NO GROWTH 2  DAYS Performed at [facility]    Report Status PENDING  Incomplete  MRSA PCR Screening     Status: None   Collection Time: 01/01/15 10:04 PM  Result Value Ref Range Status   MRSA by PCR NEGATIVE NEGATIVE Final    Comment:        The GeneXpert MRSA Assay (FDA approved for NASAL specimens only), is one component of a comprehensive MRSA colonization surveillance program. It is not intended to diagnose MRSA infection nor to guide or monitor treatment for MRSA infections.      Labs: Basic Metabolic Panel:  Recent Labs Lab 01/01/15 1128 01/01/15 1141 01/01/15 2025 01/02/15 0210 01/02/15 2051 01/03/15 0549 01/03/15 1405  NA 140 140  --  139  --  140 139  K 3.6 3.5  --  3.4*  --  3.2* 3.4*  CL 104 104  --  108  --  105 105  CO2 20*  --   --  21*  --  26 27  GLUCOSE 114* 114*  --  117*  --  122* 113*  BUN 7 6  --  <5*  --  <5* <5*  CREATININE 0.84 0.70 0.78 0.47  --  0.50 0.56  CALCIUM 9.5  --   --  7.7*  --  8.3* 8.5*  MG  --   --   --  1.4* 2.9* 2.5*  --   PHOS  --   --   --  2.4*  --   --   --    Liver Function Tests:  Recent Labs Lab 01/01/15 1128  AST 40  ALT 19  ALKPHOS 246*  BILITOT 0.4  PROT 9.6*  ALBUMIN 4.5   No results for input(s): LIPASE, AMYLASE in the last 168 hours. No results for input(s): AMMONIA in the last 168 hours. CBC:  Recent Labs Lab 01/01/15 1128 01/01/15 1141 01/01/15 2025 01/02/15 0210 01/03/15 0549  WBC 23.4*  --  22.9* 16.8* 7.8  NEUTROABS 20.4*  --   --   --   --   HGB 13.9 15.6* 11.2* 11.4* 11.4*  HCT 41.5 46.0 34.6* 34.6* 34.9*  MCV 91.8  --  94.5 92.8 94.1  PLT 538*  --  575* 433* 422*   Cardiac Enzymes:  Recent Labs Lab 01/01/15 2025 01/02/15 0211 01/02/15 0851  TROPONINI 0.10* <  0.03 <0.03   BNP: BNP (last 3 results) No results for input(s): BNP in the last 8760 hours.  ProBNP (last 3 results) No results for input(s): PROBNP in the last 8760 hours.  CBG:  Recent Labs Lab 01/01/15 2349   GLUCAP 149*       Signed:  Khya Halls S  [facility] 01/03/2015, 4:07 PM

## 2015-01-06 LAB — CULTURE, BLOOD (ROUTINE X 2)
Culture: NO GROWTH
Culture: NO GROWTH

## 2015-01-10 ENCOUNTER — Emergency Department (HOSPITAL_COMMUNITY): Payer: Medicaid Other

## 2015-01-10 ENCOUNTER — Encounter (HOSPITAL_COMMUNITY): Payer: Self-pay | Admitting: Oncology

## 2015-01-10 ENCOUNTER — Inpatient Hospital Stay (HOSPITAL_COMMUNITY)
Admission: EM | Admit: 2015-01-10 | Discharge: 2015-01-17 | DRG: 683 | Disposition: A | Discharge status: Home / Self Care | Payer: Medicaid Other | Attending: Internal Medicine | Admitting: Internal Medicine

## 2015-01-10 DIAGNOSIS — K589 Irritable bowel syndrome without diarrhea: Secondary | ICD-10-CM | POA: Diagnosis present

## 2015-01-10 DIAGNOSIS — N179 Acute kidney failure, unspecified: Principal | ICD-10-CM | POA: Diagnosis present

## 2015-01-10 DIAGNOSIS — Z221 Carrier of other intestinal infectious diseases: Secondary | ICD-10-CM

## 2015-01-10 DIAGNOSIS — R6510 Systemic inflammatory response syndrome (SIRS) of non-infectious origin without acute organ dysfunction: Secondary | ICD-10-CM | POA: Diagnosis present

## 2015-01-10 DIAGNOSIS — M545 Low back pain: Secondary | ICD-10-CM | POA: Diagnosis present

## 2015-01-10 DIAGNOSIS — R000 Tachycardia, unspecified: Secondary | ICD-10-CM

## 2015-01-10 DIAGNOSIS — F329 Major depressive disorder, single episode, unspecified: Secondary | ICD-10-CM | POA: Diagnosis present

## 2015-01-10 DIAGNOSIS — Z79899 Other long term (current) drug therapy: Secondary | ICD-10-CM

## 2015-01-10 DIAGNOSIS — I10 Essential (primary) hypertension: Secondary | ICD-10-CM | POA: Diagnosis present

## 2015-01-10 DIAGNOSIS — R1084 Generalized abdominal pain: Secondary | ICD-10-CM | POA: Diagnosis present

## 2015-01-10 DIAGNOSIS — F4310 Post-traumatic stress disorder, unspecified: Secondary | ICD-10-CM | POA: Diagnosis present

## 2015-01-10 DIAGNOSIS — M1990 Unspecified osteoarthritis, unspecified site: Secondary | ICD-10-CM | POA: Diagnosis present

## 2015-01-10 DIAGNOSIS — X58XXXA Exposure to other specified factors, initial encounter: Secondary | ICD-10-CM | POA: Diagnosis present

## 2015-01-10 DIAGNOSIS — A0472 Enterocolitis due to Clostridium difficile, not specified as recurrent: Secondary | ICD-10-CM | POA: Insufficient documentation

## 2015-01-10 DIAGNOSIS — F1193 Opioid use, unspecified with withdrawal: Secondary | ICD-10-CM | POA: Diagnosis present

## 2015-01-10 DIAGNOSIS — E872 Acidosis, unspecified: Secondary | ICD-10-CM | POA: Diagnosis present

## 2015-01-10 DIAGNOSIS — F419 Anxiety disorder, unspecified: Secondary | ICD-10-CM | POA: Diagnosis present

## 2015-01-10 DIAGNOSIS — A047 Enterocolitis due to Clostridium difficile: Secondary | ICD-10-CM | POA: Diagnosis present

## 2015-01-10 DIAGNOSIS — E739 Lactose intolerance, unspecified: Secondary | ICD-10-CM | POA: Diagnosis present

## 2015-01-10 DIAGNOSIS — E876 Hypokalemia: Secondary | ICD-10-CM | POA: Diagnosis present

## 2015-01-10 DIAGNOSIS — E785 Hyperlipidemia, unspecified: Secondary | ICD-10-CM | POA: Diagnosis present

## 2015-01-10 DIAGNOSIS — G8929 Other chronic pain: Secondary | ICD-10-CM | POA: Diagnosis present

## 2015-01-10 DIAGNOSIS — R109 Unspecified abdominal pain: Secondary | ICD-10-CM | POA: Diagnosis present

## 2015-01-10 DIAGNOSIS — F1123 Opioid dependence with withdrawal: Secondary | ICD-10-CM | POA: Diagnosis present

## 2015-01-10 DIAGNOSIS — D72829 Elevated white blood cell count, unspecified: Secondary | ICD-10-CM | POA: Diagnosis present

## 2015-01-10 DIAGNOSIS — E860 Dehydration: Secondary | ICD-10-CM | POA: Diagnosis present

## 2015-01-10 DIAGNOSIS — Z85828 Personal history of other malignant neoplasm of skin: Secondary | ICD-10-CM

## 2015-01-10 DIAGNOSIS — S3210XA Unspecified fracture of sacrum, initial encounter for closed fracture: Secondary | ICD-10-CM | POA: Diagnosis present

## 2015-01-10 LAB — URINE MICROSCOPIC-ADD ON

## 2015-01-10 LAB — COMPREHENSIVE METABOLIC PANEL
ALK PHOS: 168 U/L — AB (ref 38–126)
ALT: 9 U/L — ABNORMAL LOW (ref 14–54)
ANION GAP: 21 — AB (ref 5–15)
AST: 29 U/L (ref 15–41)
Albumin: 4.4 g/dL (ref 3.5–5.0)
BILIRUBIN TOTAL: 0.6 mg/dL (ref 0.3–1.2)
BUN: 27 mg/dL — ABNORMAL HIGH (ref 6–20)
CALCIUM: 9.7 mg/dL (ref 8.9–10.3)
CO2: 13 mmol/L — ABNORMAL LOW (ref 22–32)
Chloride: 105 mmol/L (ref 101–111)
Creatinine, Ser: 3.88 mg/dL — ABNORMAL HIGH (ref 0.44–1.00)
GFR, EST AFRICAN AMERICAN: 15 mL/min — AB (ref >60)
GFR, EST NON AFRICAN AMERICAN: 13 mL/min — AB (ref >60)
Glucose, Bld: 192 mg/dL — ABNORMAL HIGH (ref 65–99)
Potassium: 3.5 mmol/L (ref 3.5–5.1)
SODIUM: 139 mmol/L (ref 135–145)
TOTAL PROTEIN: 9.1 g/dL — AB (ref 6.5–8.1)

## 2015-01-10 LAB — URINALYSIS, ROUTINE W REFLEX MICROSCOPIC
Bilirubin Urine: NEGATIVE
Glucose, UA: NEGATIVE mg/dL
Hgb urine dipstick: NEGATIVE
KETONES UR: NEGATIVE mg/dL
LEUKOCYTES UA: NEGATIVE
Nitrite: NEGATIVE
PROTEIN: 30 mg/dL — AB
Specific Gravity, Urine: 1.012 (ref 1.005–1.030)
UROBILINOGEN UA: 0.2 mg/dL (ref 0.0–1.0)
pH: 5 (ref 5.0–8.0)

## 2015-01-10 LAB — RAPID URINE DRUG SCREEN, HOSP PERFORMED
AMPHETAMINES: NOT DETECTED
BENZODIAZEPINES: POSITIVE — AB
Barbiturates: NOT DETECTED
Cocaine: NOT DETECTED
OPIATES: POSITIVE — AB
Tetrahydrocannabinol: NOT DETECTED

## 2015-01-10 LAB — CBC
HCT: 37.3 % (ref 36.0–46.0)
HEMOGLOBIN: 12.8 g/dL (ref 12.0–15.0)
MCH: 31.1 pg (ref 26.0–34.0)
MCHC: 34.3 g/dL (ref 30.0–36.0)
MCV: 90.5 fL (ref 78.0–100.0)
PLATELETS: 589 10*3/uL — AB (ref 150–400)
RBC: 4.12 MIL/uL (ref 3.87–5.11)
RDW: 13.6 % (ref 11.5–15.5)
WBC: 28.1 10*3/uL — AB (ref 4.0–10.5)

## 2015-01-10 LAB — I-STAT CG4 LACTIC ACID, ED: Lactic Acid, Venous: 6.07 mmol/L (ref 0.5–2.0)

## 2015-01-10 LAB — LIPASE, BLOOD: Lipase: 17 U/L — ABNORMAL LOW (ref 22–51)

## 2015-01-10 LAB — TROPONIN I: Troponin I: <0.03 ng/mL (ref <0.031)

## 2015-01-10 MED ORDER — LORAZEPAM 2 MG/ML IJ SOLN
2.0000 mg | Freq: Once | INTRAMUSCULAR | Status: AC
  Administered 2015-01-10: 2 mg via INTRAMUSCULAR
  Filled 2015-01-10: qty 1

## 2015-01-10 MED ORDER — ONDANSETRON HCL 4 MG/2ML IJ SOLN
4.0000 mg | Freq: Once | INTRAMUSCULAR | Status: AC | PRN
  Administered 2015-01-10: 4 mg via INTRAVENOUS
  Filled 2015-01-10: qty 2

## 2015-01-10 MED ORDER — PROMETHAZINE HCL 25 MG/ML IJ SOLN
12.5000 mg | Freq: Once | INTRAMUSCULAR | Status: AC
  Administered 2015-01-10: 12.5 mg via INTRAVENOUS
  Filled 2015-01-10: qty 1

## 2015-01-10 MED ORDER — SODIUM CHLORIDE 0.9 % IV BOLUS (SEPSIS)
1000.0000 mL | Freq: Once | INTRAVENOUS | Status: AC
  Administered 2015-01-11: 1000 mL via INTRAVENOUS

## 2015-01-10 MED ORDER — SODIUM CHLORIDE 0.9 % IV BOLUS (SEPSIS)
1000.0000 mL | Freq: Once | INTRAVENOUS | Status: AC
  Administered 2015-01-10: 1000 mL via INTRAVENOUS

## 2015-01-10 MED ORDER — LORAZEPAM 2 MG/ML IJ SOLN
1.0000 mg | Freq: Once | INTRAMUSCULAR | Status: AC
  Administered 2015-01-10: 1 mg via INTRAVENOUS
  Filled 2015-01-10: qty 1

## 2015-01-10 NOTE — ED Notes (Signed)
Bed: WA06 Expected date:  Expected time:  Means of arrival:  Comments: [place]

## 2015-01-10 NOTE — ED Notes (Signed)
Pt presents d/t vomiting x 1 hour.  Pt w/ less than 50 ml's of emesis in triage.

## 2015-01-10 NOTE — ED Notes (Signed)
Little, EDP given CG4 lactic result.

## 2015-01-10 NOTE — ED Notes (Signed)
RN unable to draw enough blood for labs through IV.

## 2015-01-10 NOTE — ED Notes (Signed)
Pt yelling and moaning in the lobby, assisted pt to the restroom and back into wheelchair. RN is aware.

## 2015-01-10 NOTE — ED Provider Notes (Signed)
CSN: [id number]     Arrival date & time 01/10/15  2011 History   First MD Initiated Contact with Patient 01/10/15 2114     Chief Complaint  Patient presents with  . Emesis     (Consider location/radiation/quality/duration/timing/severity/associated sxs/prior Treatment) HPI Comments: 44 year old female with past medical history including hypertension, hyperlipidemia, anxiety, chronic low back pain on chronic opiates who presents with vomiting. History limited due to the patient's distress and inability to provide a full history during examination. Patient states that she began vomiting one hour prior to arrival. She complains of severe, constant upper abdominal pain. Her symptoms are exactly like previous episode of vomiting and abdominal pain which resulted in hospitalization earlier this month. She reports temp up to 99.6. She may have some dysuria. She denies any illicit drug use. Last dose of home narcotic was this morning.  Patient is a 44 y.o. female presenting with vomiting. The history is provided by the patient.  Emesis   Past Medical History  Diagnosis Date  . PTSD (post-traumatic stress disorder)   . Bulging lumbar disc   . Depression   . Hypertension   . Hyperlipemia   . Headache     "weekly" (10/15/2014)  . DDD (degenerative disc disease), cervical   . Arthritis     "joints ache all over" (10/15/2014)  . Chronic lower back pain   . Anxiety   . Skin cancer     "had them cut off my arms; don't know what kind"   Past Surgical History  Procedure Laterality Date  . Ablation on endometriosis  2008  . Hemorrhoid surgery  ~ 2002   No family history on file. Social History  Substance Use Topics  . Smoking status: Never Smoker   . Smokeless tobacco: Never Used  . Alcohol Use: Yes     Comment: 10/15/2014 "I've drank before; nothing regular; don't drink now cause of RX I'm on"   OB History    No data available     Review of Systems  Unable to perform ROS: Acuity of  condition  Gastrointestinal: Positive for vomiting.     Allergies  Lactose intolerance (gi)  Home Medications   Prior to Admission medications   Medication Sig Start Date End Date Taking? Authorizing Provider  amLODipine (NORVASC) 10 MG tablet Take 1 tablet (10 mg total) by mouth daily. 10/17/14  Yes Kelvin Cellar, MD  diazepam (VALIUM) 2 MG tablet Take 1 tablet (2 mg total) by mouth every 6 (six) hours as needed for anxiety. 01/03/15  Yes Oswald Hillock, MD  docusate sodium (COLACE) 100 MG capsule Take 1 capsule (100 mg total) by mouth 2 (two) times daily. Patient taking differently: Take 100 mg by mouth 2 (two) times daily as needed (for consitpation).  12/20/14  Yes Eugenie Filler, MD  DULoxetine (CYMBALTA) 30 MG capsule Take 1 capsule (30 mg total) by mouth daily. 12/20/14  Yes Eugenie Filler, MD  HYDROcodone-acetaminophen (NORCO) 10-325 MG per tablet Take 1 tablet by mouth every 6 (six) hours as needed for moderate pain.   Yes Historical Provider, MD  ondansetron (ZOFRAN ODT) 4 MG disintegrating tablet Take 1 tablet (4 mg total) by mouth every 8 (eight) hours as needed for nausea or vomiting. 01/03/15  Yes Oswald Hillock, MD  polyethylene glycol (MIRALAX / GLYCOLAX) packet Take 17 g by mouth daily. Hold if you develop diarrhea Patient taking differently: Take 17 g by mouth daily as needed (for constipation). Hold if you develop  diarrhea 12/20/14  Yes Eugenie Filler, MD  tiZANidine (ZANAFLEX) 4 MG tablet Take 1 tablet (4 mg total) by mouth 3 (three) times daily. 12/20/14  Yes Eugenie Filler, MD  sulfamethoxazole-trimethoprim (BACTRIM DS,SEPTRA DS) 800-160 MG per tablet Take 1 tablet by mouth 2 (two) times daily. 01/03/15   Oswald Hillock, MD   BP 161/101 mmHg  Pulse 135  Temp(Src) 98.5 F (36.9 C) (Oral)  Resp 23  Ht 5\' 4"  (1.626 m)  Wt 170 lb (77.111 kg)  BMI 29.17 kg/m2  SpO2 99%  LMP 11/23/2014 Physical Exam  Constitutional: She is oriented to person, place, and time.   Diaphoretic, moaning, retching in bag, in distress  HENT:  Head: Normocephalic and atraumatic.  dry mucous membranes  Eyes: Conjunctivae are normal. Pupils are equal, round, and reactive to light.  Neck: Neck supple.  Cardiovascular: Normal rate, regular rhythm and normal heart sounds.   No murmur heard. Pulmonary/Chest: Effort normal and breath sounds normal.  Abdominal: Soft. Bowel sounds are normal. She exhibits no distension.  Mild diffuse tenderness worst in mid epigastrium, no rebound or guarding  Musculoskeletal: She exhibits no edema.  Neurological: She is alert and oriented to person, place, and time.  Skin:  Diaphoretic  Psychiatric:  Moaning, stating "help me, I think I'm dying"  Nursing note and vitals reviewed.   ED Course  Procedures (including critical care time) Labs Review Labs Reviewed  LIPASE, BLOOD - Abnormal; Notable for the following:    Lipase 17 (*)    All other components within normal limits  COMPREHENSIVE METABOLIC PANEL - Abnormal; Notable for the following:    CO2 13 (*)    Glucose, Bld 192 (*)    BUN 27 (*)    Creatinine, Ser 3.88 (*)    Total Protein 9.1 (*)    ALT 9 (*)    Alkaline Phosphatase 168 (*)    GFR calc non Af Amer 13 (*)    GFR calc Af Amer 15 (*)    Anion gap 21 (*)    All other components within normal limits  CBC - Abnormal; Notable for the following:    WBC 28.1 (*)    Platelets 589 (*)    All other components within normal limits  URINALYSIS, ROUTINE W REFLEX MICROSCOPIC (NOT AT [facility]) - Abnormal; Notable for the following:    Protein, ur 30 (*)    All other components within normal limits  URINE RAPID DRUG SCREEN, HOSP PERFORMED - Abnormal; Notable for the following:    Opiates POSITIVE (*)    Benzodiazepines POSITIVE (*)    All other components within normal limits  URINE MICROSCOPIC-ADD ON - Abnormal; Notable for the following:    Casts HYALINE CASTS (*)    All other components within normal limits  I-STAT CG4  LACTIC ACID, ED - Abnormal; Notable for the following:    Lactic Acid, Venous 6.07 (*)    All other components within normal limits  CULTURE, BLOOD (ROUTINE X 2)  CULTURE, BLOOD (ROUTINE X 2)  URINE CULTURE  TROPONIN I  PREGNANCY, URINE  I-STAT CG4 LACTIC ACID, ED    Imaging Review No results found. I have personally reviewed and evaluated these images and lab results as part of my medical decision-making.   EKG Interpretation   Date/Time:  Monday January 10 2015 23:06:10 EDT Ventricular Rate:  147 PR Interval:  124 QRS Duration: 82 QT Interval:  288 QTC Calculation: 450 R Axis:   4 Text  Interpretation:  Sinus tachycardia Atrial premature complex  Nonspecific repol abnrm, inferolateral lds Artifact in lead(s) I II III  aVR aVL aVF V1 V3 V4 V5 V6 and baseline wander in lead(s) III Confirmed by  LITTLE MD, RACHEL [id number]) on 01/11/2015 12:25:49 AM     Medications  promethazine (PHENERGAN) injection 12.5 mg (not administered)  sodium chloride 0.9 % bolus 1,000 mL (not administered)  piperacillin-tazobactam (ZOSYN) IVPB 3.375 g (not administered)  vancomycin (VANCOCIN) IVPB 1000 mg/200 mL premix (not administered)  LORazepam (ATIVAN) injection 3 mg (not administered)  ondansetron (ZOFRAN) injection 4 mg (4 mg Intravenous Given 01/10/15 2148)  sodium chloride 0.9 % bolus 1,000 mL (1,000 mLs Intravenous New Bag/Given 01/10/15 2239)  promethazine (PHENERGAN) injection 12.5 mg (12.5 mg Intravenous Given 01/10/15 2252)  LORazepam (ATIVAN) injection 1 mg (1 mg Intravenous Given 01/10/15 2239)  sodium chloride 0.9 % bolus 1,000 mL (1,000 mLs Intravenous New Bag/Given 01/11/15 0019)  LORazepam (ATIVAN) injection 2 mg (2 mg Intramuscular Given 01/10/15 2358)    MDM   Final diagnoses:  None    44 year old female who presents with vomiting that began 1 hour prior to arrival and are associated with severe generalized abdominal pain. Patient was moaning, diaphoretic, and retching into an  emesis bag at presentation. Vital signs notable for tachycardia in the 140s. Patient was afebrile. Examination difficult due to patient's constant retching however no peritonitis on exam. Obtained above lab work. Patient requested pain medication multiple times during my examination. Gave her Zofran and later Phenergan and Ativan for nausea. Her diaphoresis, tachycardia, and vomiting are concerning for a withdrawal syndrome. Gave the patient an IV fluid bolus.  Labs notable for lactate 6, creatinine of 3.8; patients baseline creatinine is normal. Anion gap is 21 and bicarbonate is 13. WBC 28,000. Initial troponin normal and lipase also normal. I reviewed the patient's chart and she has had 6 CT scans in the past 4 months here and I am concerned about her cumulative radiation exposure. Thus, obtained KUB to evaluate for signs of obstruction or pneumoperitoneum.   I have discussed the patient's case with the admitting hospitalist. We are concerned about the patient's lactic acidosis and significant leukocytosis. Given her significantly abnormal lab work as well as her ongoing tachycardia, I have initiated a sepsis protocol with broad-spectrum antibiotics. I have also ordered a third liter of fluids. The patient continues to have vomiting and tachycardia despite multiple doses of Ativan. I reviewed her data base prescriptions and she appears to take at least 2 mg of Ativan 3 times a day in addition to multiple doses of lortab 10mg -325mg  daily. Her clinical picture is very concerning for acute withdrawal of opiates and/or benzos. Patient will be admitted for further care.  CRITICAL CARE Performed by: Wenda Overland Little   Total critical care time: 45 minutes  Critical care time was exclusive of separately billable procedures and treating other patients.  Critical care was necessary to treat or prevent imminent or life-threatening deterioration.  Critical care was time spent personally by me on the  following activities: development of treatment plan with patient and/or surrogate as well as nursing, discussions with consultants, evaluation of patient's response to treatment, examination of patient, obtaining history from patient or surrogate, ordering and performing treatments and interventions, ordering and review of laboratory studies, ordering and review of radiographic studies, pulse oximetry and re-evaluation of patient's condition.    Sharlett Iles, MD 01/11/15 [phone]

## 2015-01-11 ENCOUNTER — Emergency Department (HOSPITAL_COMMUNITY): Payer: Medicaid Other

## 2015-01-11 DIAGNOSIS — R1084 Generalized abdominal pain: Secondary | ICD-10-CM

## 2015-01-11 DIAGNOSIS — M545 Low back pain: Secondary | ICD-10-CM | POA: Diagnosis present

## 2015-01-11 DIAGNOSIS — N179 Acute kidney failure, unspecified: Secondary | ICD-10-CM | POA: Diagnosis not present

## 2015-01-11 DIAGNOSIS — Z221 Carrier of other intestinal infectious diseases: Secondary | ICD-10-CM | POA: Diagnosis not present

## 2015-01-11 DIAGNOSIS — K589 Irritable bowel syndrome without diarrhea: Secondary | ICD-10-CM | POA: Diagnosis present

## 2015-01-11 DIAGNOSIS — D72829 Elevated white blood cell count, unspecified: Secondary | ICD-10-CM | POA: Diagnosis present

## 2015-01-11 DIAGNOSIS — F4310 Post-traumatic stress disorder, unspecified: Secondary | ICD-10-CM | POA: Diagnosis present

## 2015-01-11 DIAGNOSIS — F329 Major depressive disorder, single episode, unspecified: Secondary | ICD-10-CM | POA: Diagnosis present

## 2015-01-11 DIAGNOSIS — R109 Unspecified abdominal pain: Secondary | ICD-10-CM | POA: Diagnosis present

## 2015-01-11 DIAGNOSIS — E872 Acidosis: Secondary | ICD-10-CM | POA: Diagnosis present

## 2015-01-11 DIAGNOSIS — R6510 Systemic inflammatory response syndrome (SIRS) of non-infectious origin without acute organ dysfunction: Secondary | ICD-10-CM | POA: Diagnosis present

## 2015-01-11 DIAGNOSIS — Z85828 Personal history of other malignant neoplasm of skin: Secondary | ICD-10-CM | POA: Diagnosis not present

## 2015-01-11 DIAGNOSIS — E785 Hyperlipidemia, unspecified: Secondary | ICD-10-CM | POA: Diagnosis present

## 2015-01-11 DIAGNOSIS — F419 Anxiety disorder, unspecified: Secondary | ICD-10-CM | POA: Diagnosis present

## 2015-01-11 DIAGNOSIS — E860 Dehydration: Secondary | ICD-10-CM | POA: Diagnosis present

## 2015-01-11 DIAGNOSIS — I10 Essential (primary) hypertension: Secondary | ICD-10-CM | POA: Diagnosis present

## 2015-01-11 DIAGNOSIS — E739 Lactose intolerance, unspecified: Secondary | ICD-10-CM | POA: Diagnosis present

## 2015-01-11 DIAGNOSIS — R1110 Vomiting, unspecified: Secondary | ICD-10-CM | POA: Diagnosis present

## 2015-01-11 DIAGNOSIS — M1990 Unspecified osteoarthritis, unspecified site: Secondary | ICD-10-CM | POA: Diagnosis present

## 2015-01-11 DIAGNOSIS — X58XXXA Exposure to other specified factors, initial encounter: Secondary | ICD-10-CM | POA: Diagnosis present

## 2015-01-11 DIAGNOSIS — A047 Enterocolitis due to Clostridium difficile: Secondary | ICD-10-CM | POA: Diagnosis present

## 2015-01-11 DIAGNOSIS — E876 Hypokalemia: Secondary | ICD-10-CM | POA: Diagnosis present

## 2015-01-11 DIAGNOSIS — G8929 Other chronic pain: Secondary | ICD-10-CM | POA: Diagnosis present

## 2015-01-11 DIAGNOSIS — Z79899 Other long term (current) drug therapy: Secondary | ICD-10-CM | POA: Diagnosis not present

## 2015-01-11 DIAGNOSIS — S3210XA Unspecified fracture of sacrum, initial encounter for closed fracture: Secondary | ICD-10-CM | POA: Diagnosis present

## 2015-01-11 DIAGNOSIS — F1123 Opioid dependence with withdrawal: Secondary | ICD-10-CM | POA: Diagnosis present

## 2015-01-11 LAB — BASIC METABOLIC PANEL
ANION GAP: 18 — AB (ref 5–15)
BUN: 24 mg/dL — ABNORMAL HIGH (ref 6–20)
CALCIUM: 8.6 mg/dL — AB (ref 8.9–10.3)
CO2: 15 mmol/L — ABNORMAL LOW (ref 22–32)
Chloride: 108 mmol/L (ref 101–111)
Creatinine, Ser: 3.36 mg/dL — ABNORMAL HIGH (ref 0.44–1.00)
GFR, EST AFRICAN AMERICAN: 18 mL/min — AB (ref >60)
GFR, EST NON AFRICAN AMERICAN: 16 mL/min — AB (ref >60)
Glucose, Bld: 169 mg/dL — ABNORMAL HIGH (ref 65–99)
POTASSIUM: 4 mmol/L (ref 3.5–5.1)
SODIUM: 141 mmol/L (ref 135–145)

## 2015-01-11 LAB — MRSA PCR SCREENING: MRSA BY PCR: NEGATIVE

## 2015-01-11 LAB — LACTIC ACID, PLASMA
LACTIC ACID, VENOUS: 6 mmol/L — AB (ref 0.5–2.0)
LACTIC ACID, VENOUS: 2.2 mmol/L — AB (ref 0.5–2.0)

## 2015-01-11 LAB — I-STAT CG4 LACTIC ACID, ED: LACTIC ACID, VENOUS: 6.35 mmol/L — AB (ref 0.5–2.0)

## 2015-01-11 LAB — PREGNANCY, URINE: Preg Test, Ur: NEGATIVE

## 2015-01-11 MED ORDER — VANCOMYCIN HCL IN DEXTROSE 1-5 GM/200ML-% IV SOLN
1000.0000 mg | Freq: Once | INTRAVENOUS | Status: AC
  Administered 2015-01-11: 1000 mg via INTRAVENOUS
  Filled 2015-01-11: qty 200

## 2015-01-11 MED ORDER — PIPERACILLIN-TAZOBACTAM 3.375 G IVPB 30 MIN
3.3750 g | Freq: Once | INTRAVENOUS | Status: DC
  Filled 2015-01-11 (×2): qty 50

## 2015-01-11 MED ORDER — DULOXETINE HCL 30 MG PO CPEP: 30.0000 mg | ORAL_CAPSULE | Freq: Every day | ORAL | Status: DC

## 2015-01-11 MED ORDER — LOPERAMIDE HCL 2 MG PO CAPS: 2.0000 mg | ORAL_CAPSULE | ORAL | Status: DC | PRN

## 2015-01-11 MED ORDER — PROMETHAZINE HCL 25 MG PO TABS
12.5000 mg | ORAL_TABLET | Freq: Four times a day (QID) | ORAL | Status: DC | PRN
  Administered 2015-01-11: 12.5 mg via ORAL
  Filled 2015-01-11: qty 1

## 2015-01-11 MED ORDER — PROMETHAZINE HCL 25 MG/ML IJ SOLN
12.5000 mg | Freq: Once | INTRAMUSCULAR | Status: AC
  Administered 2015-01-11: 12.5 mg via INTRAVENOUS

## 2015-01-11 MED ORDER — METHOCARBAMOL 500 MG PO TABS
500.0000 mg | ORAL_TABLET | Freq: Three times a day (TID) | ORAL | Status: DC | PRN
Start: 2015-01-11 — End: 2015-01-15
  Administered 2015-01-11 – 2015-01-14 (×4): 500 mg via ORAL
  Filled 2015-01-11 (×4): qty 1

## 2015-01-11 MED ORDER — ONDANSETRON 4 MG PO TBDP
4.0000 mg | ORAL_TABLET | Freq: Four times a day (QID) | ORAL | Status: DC | PRN
  Administered 2015-01-12 – 2015-01-15 (×4): 4 mg via ORAL
  Filled 2015-01-11 (×6): qty 1

## 2015-01-11 MED ORDER — VANCOMYCIN HCL IN DEXTROSE 1-5 GM/200ML-% IV SOLN
1000.0000 mg | INTRAVENOUS | Status: DC
Start: 2015-01-11 — End: 2015-01-14
  Administered 2015-01-11 – 2015-01-13 (×3): 1000 mg via INTRAVENOUS
  Filled 2015-01-11 (×3): qty 200

## 2015-01-11 MED ORDER — DOCUSATE SODIUM 100 MG PO CAPS: 100.0000 mg | ORAL_CAPSULE | Freq: Two times a day (BID) | ORAL | Status: DC | PRN

## 2015-01-11 MED ORDER — SODIUM BICARBONATE 8.4 % IV SOLN
INTRAVENOUS | Status: DC
  Administered 2015-01-11: 13:00:00 via INTRAVENOUS
  Filled 2015-01-11 (×7): qty 150

## 2015-01-11 MED ORDER — HEPARIN SODIUM (PORCINE) 5000 UNIT/ML IJ SOLN
5000.0000 [IU] | Freq: Three times a day (TID) | INTRAMUSCULAR | Status: DC
  Administered 2015-01-11 – 2015-01-17 (×19): 5000 [IU] via SUBCUTANEOUS
  Filled 2015-01-11 (×19): qty 1

## 2015-01-11 MED ORDER — TRAMADOL HCL 50 MG PO TABS
50.0000 mg | ORAL_TABLET | Freq: Four times a day (QID) | ORAL | Status: DC | PRN
  Administered 2015-01-14: 50 mg via ORAL
  Filled 2015-01-11 (×2): qty 1

## 2015-01-11 MED ORDER — PROMETHAZINE HCL 25 MG/ML IJ SOLN
12.5000 mg | Freq: Once | INTRAMUSCULAR | Status: AC
  Administered 2015-01-11: 12.5 mg via INTRAVENOUS
  Filled 2015-01-11: qty 1

## 2015-01-11 MED ORDER — NAPROXEN 500 MG PO TABS: 500.0000 mg | ORAL_TABLET | Freq: Two times a day (BID) | ORAL | Status: DC | PRN

## 2015-01-11 MED ORDER — HYDROXYZINE HCL 25 MG PO TABS
25.0000 mg | ORAL_TABLET | Freq: Four times a day (QID) | ORAL | Status: DC | PRN
  Administered 2015-01-11 – 2015-01-15 (×3): 25 mg via ORAL
  Filled 2015-01-11 (×3): qty 1

## 2015-01-11 MED ORDER — TIZANIDINE HCL 4 MG PO TABS
4.0000 mg | ORAL_TABLET | Freq: Once | ORAL | Status: AC
  Administered 2015-01-11: 4 mg via ORAL
  Filled 2015-01-11 (×2): qty 1

## 2015-01-11 MED ORDER — SODIUM CHLORIDE 0.9 % IV SOLN
INTRAVENOUS | Status: DC
Start: 2015-01-11 — End: 2015-01-11
  Administered 2015-01-11: 03:00:00 via INTRAVENOUS

## 2015-01-11 MED ORDER — CLONIDINE HCL 0.1 MG PO TABS
0.1000 mg | ORAL_TABLET | Freq: Four times a day (QID) | ORAL | Status: AC
  Administered 2015-01-11 – 2015-01-13 (×7): 0.1 mg via ORAL
  Filled 2015-01-11 (×7): qty 1

## 2015-01-11 MED ORDER — DICYCLOMINE HCL 20 MG PO TABS
20.0000 mg | ORAL_TABLET | Freq: Four times a day (QID) | ORAL | Status: DC | PRN
  Administered 2015-01-11: 20 mg via ORAL
  Filled 2015-01-11 (×2): qty 1

## 2015-01-11 MED ORDER — ONDANSETRON 4 MG PO TBDP
4.0000 mg | ORAL_TABLET | Freq: Three times a day (TID) | ORAL | Status: DC | PRN
  Filled 2015-01-11: qty 1

## 2015-01-11 MED ORDER — SODIUM CHLORIDE 0.9 % IV BOLUS (SEPSIS)
1000.0000 mL | Freq: Once | INTRAVENOUS | Status: AC
  Administered 2015-01-11: 1000 mL via INTRAVENOUS

## 2015-01-11 MED ORDER — LIDOCAINE 5 % EX PTCH
1.0000 | MEDICATED_PATCH | CUTANEOUS | Status: DC
  Administered 2015-01-15 – 2015-01-17 (×2): 1 via TRANSDERMAL
  Filled 2015-01-11 (×7): qty 1

## 2015-01-11 MED ORDER — AMLODIPINE BESYLATE 10 MG PO TABS
10.0000 mg | ORAL_TABLET | Freq: Every day | ORAL | Status: DC
  Administered 2015-01-12 – 2015-01-17 (×6): 10 mg via ORAL
  Filled 2015-01-11 (×6): qty 1

## 2015-01-11 MED ORDER — CLONIDINE HCL 0.1 MG PO TABS
0.1000 mg | ORAL_TABLET | Freq: Every day | ORAL | Status: AC
  Administered 2015-01-16 – 2015-01-17 (×2): 0.1 mg via ORAL
  Filled 2015-01-11 (×2): qty 1

## 2015-01-11 MED ORDER — LORAZEPAM 2 MG/ML IJ SOLN
2.0000 mg | INTRAMUSCULAR | Status: DC | PRN
Start: 2015-01-11 — End: 2015-01-14
  Administered 2015-01-11 – 2015-01-14 (×13): 2 mg via INTRAVENOUS
  Filled 2015-01-11 (×13): qty 1

## 2015-01-11 MED ORDER — PIPERACILLIN-TAZOBACTAM 3.375 G IVPB
3.3750 g | Freq: Three times a day (TID) | INTRAVENOUS | Status: DC
  Administered 2015-01-11 – 2015-01-14 (×10): 3.375 g via INTRAVENOUS
  Filled 2015-01-11 (×8): qty 50

## 2015-01-11 MED ORDER — PROMETHAZINE HCL 25 MG/ML IJ SOLN
12.5000 mg | INTRAMUSCULAR | Status: DC | PRN
  Administered 2015-01-11 – 2015-01-14 (×14): 12.5 mg via INTRAVENOUS
  Filled 2015-01-11 (×14): qty 1

## 2015-01-11 MED ORDER — SODIUM CHLORIDE 0.9 % IV BOLUS (SEPSIS)
500.0000 mL | Freq: Once | INTRAVENOUS | Status: AC
Start: 2015-01-11 — End: 2015-01-11

## 2015-01-11 MED ORDER — POLYETHYLENE GLYCOL 3350 17 G PO PACK: 17.0000 g | PACK | Freq: Every day | ORAL | Status: DC | PRN

## 2015-01-11 MED ORDER — LORAZEPAM 2 MG/ML IJ SOLN
3.0000 mg | Freq: Once | INTRAMUSCULAR | Status: AC
  Administered 2015-01-11: 3 mg via INTRAVENOUS
  Filled 2015-01-11: qty 2

## 2015-01-11 MED ORDER — CLONIDINE HCL 0.1 MG PO TABS
0.1000 mg | ORAL_TABLET | ORAL | Status: AC
  Administered 2015-01-13 – 2015-01-15 (×4): 0.1 mg via ORAL
  Filled 2015-01-11 (×4): qty 1

## 2015-01-11 MED ORDER — HYDROMORPHONE HCL 1 MG/ML IJ SOLN
0.5000 mg | INTRAMUSCULAR | Status: DC | PRN
  Administered 2015-01-11 – 2015-01-14 (×15): 0.5 mg via INTRAVENOUS
  Filled 2015-01-11 (×15): qty 1

## 2015-01-11 NOTE — ED Notes (Signed)
Below order not completed by EW. 

## 2015-01-11 NOTE — H&P (Addendum)
[facility] History and Physical  [name] [id number] DOB: 1970/12/09 DOA: 01/10/2015  Referring physician: EDP PCP: Leonard Downing, MD   Chief Complaint: Emesis   HPI: [name] is a 44 y.o. female with 4 recent admits over past months for abdominal pain, emesis, tachycardia, lactic acidosis.  Each time treated with zosyn/vanc empirically as well as having possible withdrawal symptoms treated, followed by subsequent improvement.  Patient presents to ED once again with identical symptoms.  Patient insists that she took last norco this morning, has not taken any benzos since last discharge, and her PCP wont refill meds and she dosent know why.  Review of Systems: Systems reviewed.  As above, otherwise negative  Past Medical History  Diagnosis Date  . PTSD (post-traumatic stress disorder)   . Bulging lumbar disc   . Depression   . Hypertension   . Hyperlipemia   . Headache     "weekly" (10/15/2014)  . DDD (degenerative disc disease), cervical   . Arthritis     "joints ache all over" (10/15/2014)  . Chronic lower back pain   . Anxiety   . Skin cancer     "had them cut off my arms; don't know what kind"   Past Surgical History  Procedure Laterality Date  . Ablation on endometriosis  2008  . Hemorrhoid surgery  ~ 2002   Social History:  reports that she has never smoked. She has never used smokeless tobacco. She reports that she drinks alcohol. She reports that she does not use illicit drugs.  Allergies  Allergen Reactions  . Lactose Intolerance (Gi) Nausea Only    No family history on file.   Prior to Admission medications   Medication Sig Start Date End Date Taking? Authorizing Provider  amLODipine (NORVASC) 10 MG tablet Take 1 tablet (10 mg total) by mouth daily. 10/17/14  Yes Kelvin Cellar, MD  diazepam (VALIUM) 2 MG tablet Take 1 tablet (2 mg total) by mouth every 6 (six) hours as needed for anxiety. 01/03/15  Yes Oswald Hillock, MD  docusate  sodium (COLACE) 100 MG capsule Take 1 capsule (100 mg total) by mouth 2 (two) times daily. Patient taking differently: Take 100 mg by mouth 2 (two) times daily as needed (for consitpation).  12/20/14  Yes Eugenie Filler, MD  DULoxetine (CYMBALTA) 30 MG capsule Take 1 capsule (30 mg total) by mouth daily. 12/20/14  Yes Eugenie Filler, MD  HYDROcodone-acetaminophen (NORCO) 10-325 MG per tablet Take 1 tablet by mouth every 6 (six) hours as needed for moderate pain.   Yes Historical Provider, MD  ondansetron (ZOFRAN ODT) 4 MG disintegrating tablet Take 1 tablet (4 mg total) by mouth every 8 (eight) hours as needed for nausea or vomiting. 01/03/15  Yes Oswald Hillock, MD  polyethylene glycol (MIRALAX / GLYCOLAX) packet Take 17 g by mouth daily. Hold if you develop diarrhea Patient taking differently: Take 17 g by mouth daily as needed (for constipation). Hold if you develop diarrhea 12/20/14  Yes Eugenie Filler, MD  tiZANidine (ZANAFLEX) 4 MG tablet Take 1 tablet (4 mg total) by mouth 3 (three) times daily. 12/20/14  Yes Eugenie Filler, MD   Physical Exam: Filed Vitals:   01/11/15 0008  BP: 161/101  Pulse: 135  Temp:   Resp: 23    BP 161/101 mmHg  Pulse 135  Temp(Src) 98.5 F (36.9 C) (Oral)  Resp 23  Ht 5\' 4"  (1.626 m)  Wt 77.111 kg (170 lb)  BMI 29.17 kg/m2  SpO2 99%  LMP 11/23/2014  General Appearance:    Alert, oriented, no distress, appears stated age  Head:    Normocephalic, atraumatic  Eyes:    PERRL, EOMI, sclera non-icteric        Nose:   Nares without drainage or epistaxis. Mucosa, turbinates normal  Throat:   Moist mucous membranes. Oropharynx without erythema or exudate.  Neck:   Supple. No carotid bruits.  No thyromegaly.  No lymphadenopathy.   Back:     No CVA tenderness, no spinal tenderness  Lungs:     Clear to auscultation bilaterally, without wheezes, rhonchi or rales  Chest wall:    No tenderness to palpitation  Heart:    Regular rate and rhythm without  murmurs, gallops, rubs  Abdomen:     Soft, non-tender, nondistended, normal bowel sounds, no organomegaly  Genitalia:    deferred  Rectal:    deferred  Extremities:   No clubbing, cyanosis or edema.  Pulses:   2+ and symmetric all extremities  Skin:   Skin color, texture, turgor normal, no rashes or lesions  Lymph nodes:   Cervical, supraclavicular, and axillary nodes normal  Neurologic:   CNII-XII intact. Normal strength, sensation and reflexes      throughout    Labs on Admission:  Basic Metabolic Panel:  Recent Labs Lab 01/10/15 2157  NA 139  K 3.5  CL 105  CO2 13*  GLUCOSE 192*  BUN 27*  CREATININE 3.88*  CALCIUM 9.7   Liver Function Tests:  Recent Labs Lab 01/10/15 2157  AST 29  ALT 9*  ALKPHOS 168*  BILITOT 0.6  PROT 9.1*  ALBUMIN 4.4    Recent Labs Lab 01/10/15 2157  LIPASE 17*   No results for input(s): AMMONIA in the last 168 hours. CBC:  Recent Labs Lab 01/10/15 2157  WBC 28.1*  HGB 12.8  HCT 37.3  MCV 90.5  PLT 589*   Cardiac Enzymes:  Recent Labs Lab 01/10/15 2157  TROPONINI <0.03    BNP (last 3 results) No results for input(s): PROBNP in the last 8760 hours. CBG: No results for input(s): GLUCAP in the last 168 hours.  Radiological Exams on Admission: No results found.  EKG: Independently reviewed.  Assessment/Plan Principal Problem:   SIRS (systemic inflammatory response syndrome) Active Problems:   Abdominal pain, generalized   Lactic acidosis   Intractable abdominal pain   Opiate withdrawal   1. SIRS - 1. Empiric zosyn and vanc 2. Stopping bactrim which she wasn't taking anyhow 3. Cultures pending but am suspicious that this may be non-infectious in etiology (related to withdrawal) 4. Will avoid repeat CT abd/pelvis due to numerous studies of this done over past months for identical presentations that have been unrevealing 2. AKI - 1. Pre renal most likely 2. IVF 3. Repeat BMP in AM 3. Suspect opiate  withdrawal - will put patient on clonadine opiate withdrawal pathway, please see Dr. Biagio Borg discharge note from the 29th of last month for more info regarding this. 4. Suspect benzo withdrawal - will put patient on PRN ativan 5. Zanaflex abuse - per husband apparently was taking up to 12mg  at a time of this multiple times a day according to whomever did her med rec.  I am unable to get in touch with husband at this time despite multiple calls to home. 6. Lactic acidosis - will trend, IVF, recheck BMP in AM  Code Status: Full  Family Communication: Tried callng home multiple times  but unable to get a hold of husband who probably could give a much better history as to what actually is going on. Disposition Plan: Admit to inpatient   Time spent: 70 min  Tracy Quigley M. [facility] Pager [phone]  If 7AM-7PM, please contact the day team taking care of the patient [email or web address] Password [email or web address] 01/11/2015, 1:30 AM

## 2015-01-11 NOTE — Progress Notes (Signed)
MEDICATION RELATED CONSULT NOTE - INITIAL   Pharmacy Consult for duloxetine Indication: patient with reduced renal function  Allergies  Allergen Reactions  . Lactose Intolerance (Gi) Nausea Only    Patient Measurements: Height: 5\' 4"  (162.6 cm) Weight: 183 lb 6.8 oz (83.2 kg) IBW/kg (Calculated) : 54.7 Adjusted Body Weight:   Vital Signs: Temp: 98.9 F (37.2 C) (09/20 0339) Temp Source: Oral (09/20 0339) BP: 150/96 mmHg (09/20 0339) Pulse Rate: 135 (09/20 0339) Intake/Output from previous day: 09/19 0701 - 09/20 0700 In: 2125 [I.V.:2125] Out: 300 [Urine:300] Intake/Output from this shift: Total I/O In: 2125 [I.V.:2125] Out: 300 [Urine:300]  Labs:  Recent Labs  01/10/15 2157  WBC 28.1*  HGB 12.8  HCT 37.3  PLT 589*  CREATININE 3.88*  ALBUMIN 4.4  PROT 9.1*  AST 29  ALT 9*  ALKPHOS 168*  BILITOT 0.6   Estimated Creatinine Clearance: 19.5 mL/min (by C-G formula based on Cr of 3.88).   Microbiology: Recent Results (from the past 720 hour(s))  Culture, blood (routine x 2)     Status: None   Collection Time: 12/16/14  7:26 PM  Result Value Ref Range Status   Specimen Description BLOOD RIGHT HAND  Final   Special Requests IN PEDIATRIC BOTTLE 3CC  Final   Culture NO GROWTH 5 DAYS  Final   Report Status 12/21/2014 FINAL  Final  Culture, Urine     Status: None   Collection Time: 12/16/14 10:40 PM  Result Value Ref Range Status   Specimen Description URINE, CLEAN CATCH  Final   Special Requests NONE  Final   Culture 7,000 COLONIES/mL INSIGNIFICANT GROWTH  Final   Report Status 12/17/2014 FINAL  Final  Culture, blood (routine x 2)     Status: None   Collection Time: 12/17/14 12:01 AM  Result Value Ref Range Status   Specimen Description BLOOD RIGHT FOOT  Final   Special Requests BOTTLES DRAWN AEROBIC AND ANAEROBIC 5CC  Final   Culture NO GROWTH 5 DAYS  Final   Report Status 12/22/2014 FINAL  Final  Blood culture (routine x 2)     Status: None   Collection Time: 01/01/15 12:05 PM  Result Value Ref Range Status   Specimen Description BLOOD RIGHT ANTECUBITAL  Final   Special Requests BOTTLES DRAWN AEROBIC AND ANAEROBIC 5CC  Final   Culture   Final    NO GROWTH 5 DAYS Performed at [facility]    Report Status 01/06/2015 FINAL  Final  Blood culture (routine x 2)     Status: None   Collection Time: 01/01/15  1:15 PM  Result Value Ref Range Status   Specimen Description BLOOD LEFT ANTECUBITAL  Final   Special Requests BOTTLES DRAWN AEROBIC ONLY 5CC  Final   Culture   Final    NO GROWTH 5 DAYS Performed at [facility]    Report Status 01/06/2015 FINAL  Final  MRSA PCR Screening     Status: None   Collection Time: 01/01/15 10:04 PM  Result Value Ref Range Status   MRSA by PCR NEGATIVE NEGATIVE Final    Comment:        The GeneXpert MRSA Assay (FDA approved for NASAL specimens only), is one component of a comprehensive MRSA colonization surveillance program. It is not intended to diagnose MRSA infection nor to guide or monitor treatment for MRSA infections.     Medical History: Past Medical History  Diagnosis Date  . PTSD (post-traumatic stress disorder)   . Bulging  lumbar disc   . Depression   . Hypertension   . Hyperlipemia   . Headache     "weekly" (10/15/2014)  . DDD (degenerative disc disease), cervical   . Arthritis     "joints ache all over" (10/15/2014)  . Chronic lower back pain   . Anxiety   . Skin cancer     "had them cut off my arms; don't know what kind"    Medications:  Prescriptions prior to admission  Medication Sig Dispense Refill Last Dose  . amLODipine (NORVASC) 10 MG tablet Take 1 tablet (10 mg total) by mouth daily. 30 tablet 2 01/10/2015 at Unknown time  . diazepam (VALIUM) 2 MG tablet Take 1 tablet (2 mg total) by mouth every 6 (six) hours as needed for anxiety. 10 tablet 0 Past Week at Unknown time  . docusate sodium (COLACE) 100 MG capsule Take 1 capsule (100 mg total)  by mouth 2 (two) times daily. (Patient taking differently: Take 100 mg by mouth 2 (two) times daily as needed (for consitpation). ) 10 capsule 0 Past Month at Unknown time  . DULoxetine (CYMBALTA) 30 MG capsule Take 1 capsule (30 mg total) by mouth daily. 30 capsule 0 01/09/2015 at Unknown time  . HYDROcodone-acetaminophen (NORCO) 10-325 MG per tablet Take 1 tablet by mouth every 6 (six) hours as needed for moderate pain.   unknown  . ondansetron (ZOFRAN ODT) 4 MG disintegrating tablet Take 1 tablet (4 mg total) by mouth every 8 (eight) hours as needed for nausea or vomiting. 20 tablet 0 01/10/2015 at Unknown time  . polyethylene glycol (MIRALAX / GLYCOLAX) packet Take 17 g by mouth daily. Hold if you develop diarrhea (Patient taking differently: Take 17 g by mouth daily as needed (for constipation). Hold if you develop diarrhea) 14 each 0 Past Week at Unknown time  . tiZANidine (ZANAFLEX) 4 MG tablet Take 1 tablet (4 mg total) by mouth 3 (three) times daily. 1 tablet 0 01/10/2015 at Unknown time    Assessment: Patient on duloxetine prior to admission.  Patient currently with renal function < 30 mL/min.   Per duloxetine dosing guidelines, the medication is to be avoided if renal function is < 30 mL/min.  Goal of Therapy:  Same use of medication in setting of renal disfunction  Plan:  I have removed the medication at this time. If patient's renal function improves, consider restarting medication.  Tyler Deis, Shea Stakes [place] 01/11/2015,5:05 AM

## 2015-01-11 NOTE — ED Notes (Signed)
Alcario Drought, DO made aware of patient CG4 lactic result.

## 2015-01-11 NOTE — Progress Notes (Signed)
[facility]rol:  This patient has been identified as a candidate for PICC for the following reason (s): restarts due to phlebitis and infiltration in 24 hours If you agree, please write an order for the indicated device. For any questions contact the Vascular Access Team at [phone] if no answer, please leave a message.  Thank you for supporting the early vascular access assessment program.

## 2015-01-11 NOTE — Care Management Note (Signed)
Case Management Note  Patient Details  Name: [name] MRN: [id number] Date of Birth: 1971-04-22  Subjective/Objective:          sirs          Action/Plan:Date:  Sept. 20, 2016 U.R. performed for needs and level of care. Will continue to follow for Case Management needs.  Velva Harman, RN, BSN, [place]   [phone]   Expected Discharge Date:   (UNKNOWN)               Expected Discharge Plan:     In-House Referral:  NA  Discharge planning Services  CM Consult  Post Acute Care Choice:  NA Choice offered to:  NA  DME Arranged:    DME Agency:     HH Arranged:    [place] Agency:     Status of Service:  In process, will continue to follow  Medicare Important Message Given:    Date Medicare IM Given:    Medicare IM give by:    Date Additional Medicare IM Given:    Additional Medicare Important Message give by:     If discussed at [place] of Stay Meetings, dates discussed:    Additional Comments:  Leeroy Cha, RN 01/11/2015, 10:18 AM

## 2015-01-11 NOTE — Progress Notes (Signed)
ANTIBIOTIC CONSULT NOTE - INITIAL  Pharmacy Consult for Vancomycin and Zosyn  Indication: SIRS, empiric  Allergies  Allergen Reactions  . Lactose Intolerance (Gi) Nausea Only    Patient Measurements: Height: 5\' 4"  (162.6 cm) Weight: 183 lb 6.8 oz (83.2 kg) IBW/kg (Calculated) : 54.7 Adjusted Body Weight:   Vital Signs: Temp: 98.9 F (37.2 C) (09/20 0339) Temp Source: Oral (09/20 0339) BP: 150/96 mmHg (09/20 0339) Pulse Rate: 135 (09/20 0339) Intake/Output from previous day: 09/19 0701 - 09/20 0700 In: 2125 [I.V.:2125] Out: 300 [Urine:300] Intake/Output from this shift: Total I/O In: 2125 [I.V.:2125] Out: 300 [Urine:300]  Labs:  Recent Labs  01/10/15 2157 01/11/15 0345  WBC 28.1*  --   HGB 12.8  --   PLT 589*  --   CREATININE 3.88* 3.36*   Estimated Creatinine Clearance: 22.5 mL/min (by C-G formula based on Cr of 3.36). No results for input(s): VANCOTROUGH, VANCOPEAK, VANCORANDOM, GENTTROUGH, GENTPEAK, GENTRANDOM, TOBRATROUGH, TOBRAPEAK, TOBRARND, AMIKACINPEAK, AMIKACINTROU, AMIKACIN in the last 72 hours.   Microbiology: Recent Results (from the past 720 hour(s))  Culture, blood (routine x 2)     Status: None   Collection Time: 12/16/14  7:26 PM  Result Value Ref Range Status   Specimen Description BLOOD RIGHT HAND  Final   Special Requests IN PEDIATRIC BOTTLE 3CC  Final   Culture NO GROWTH 5 DAYS  Final   Report Status 12/21/2014 FINAL  Final  Culture, Urine     Status: None   Collection Time: 12/16/14 10:40 PM  Result Value Ref Range Status   Specimen Description URINE, CLEAN CATCH  Final   Special Requests NONE  Final   Culture 7,000 COLONIES/mL INSIGNIFICANT GROWTH  Final   Report Status 12/17/2014 FINAL  Final  Culture, blood (routine x 2)     Status: None   Collection Time: 12/17/14 12:01 AM  Result Value Ref Range Status   Specimen Description BLOOD RIGHT FOOT  Final   Special Requests BOTTLES DRAWN AEROBIC AND ANAEROBIC 5CC  Final   Culture NO  GROWTH 5 DAYS  Final   Report Status 12/22/2014 FINAL  Final  Blood culture (routine x 2)     Status: None   Collection Time: 01/01/15 12:05 PM  Result Value Ref Range Status   Specimen Description BLOOD RIGHT ANTECUBITAL  Final   Special Requests BOTTLES DRAWN AEROBIC AND ANAEROBIC 5CC  Final   Culture   Final    NO GROWTH 5 DAYS Performed at [facility]    Report Status 01/06/2015 FINAL  Final  Blood culture (routine x 2)     Status: None   Collection Time: 01/01/15  1:15 PM  Result Value Ref Range Status   Specimen Description BLOOD LEFT ANTECUBITAL  Final   Special Requests BOTTLES DRAWN AEROBIC ONLY 5CC  Final   Culture   Final    NO GROWTH 5 DAYS Performed at [facility]    Report Status 01/06/2015 FINAL  Final  MRSA PCR Screening     Status: None   Collection Time: 01/01/15 10:04 PM  Result Value Ref Range Status   MRSA by PCR NEGATIVE NEGATIVE Final    Comment:        The GeneXpert MRSA Assay (FDA approved for NASAL specimens only), is one component of a comprehensive MRSA colonization surveillance program. It is not intended to diagnose MRSA infection nor to guide or monitor treatment for MRSA infections.     Medical History: Past Medical History  Diagnosis  Date  . PTSD (post-traumatic stress disorder)   . Bulging lumbar disc   . Depression   . Hypertension   . Hyperlipemia   . Headache     "weekly" (10/15/2014)  . DDD (degenerative disc disease), cervical   . Arthritis     "joints ache all over" (10/15/2014)  . Chronic lower back pain   . Anxiety   . Skin cancer     "had them cut off my arms; don't know what kind"    Medications:  Anti-infectives    Start     Dose/Rate Route Frequency Ordered Stop   01/11/15 2200  vancomycin (VANCOCIN) IVPB 1000 mg/200 mL premix     1,000 mg 200 mL/hr over 60 Minutes Intravenous Every 24 hours 01/11/15 0534     01/11/15 0600  piperacillin-tazobactam (ZOSYN) IVPB 3.375 g     3.375 g 12.5 mL/hr  over 240 Minutes Intravenous 3 times per day 01/11/15 0533     01/11/15 0100  piperacillin-tazobactam (ZOSYN) IVPB 3.375 g     3.375 g 100 mL/hr over 30 Minutes Intravenous  Once 01/11/15 0053     01/11/15 0100  vancomycin (VANCOCIN) IVPB 1000 mg/200 mL premix     1,000 mg 200 mL/hr over 60 Minutes Intravenous  Once 01/11/15 0053 01/11/15 0214     Assessment: Patient with SIRS and MD wishes for empiric Vancomycin and Zosyn.  Patient with poor renal function on admit. First dose of antibiotics already given (zosyn given per ICU in ED, dose hanging empty on transfer)  Goal of Therapy:  Vancomycin trough level 15-20 mcg/ml  Zosyn based on renal function  Appropriate antibiotic dosing for renal function; eradication of infection   Plan:  Measure antibiotic drug levels at steady state Follow up culture results Vancomycin 1gm iv q24hr  Zosyn 3.375g IV Q8H infused over 4hrs.   Tyler Deis, Shea Stakes Crowford 01/11/2015,5:37 AM

## 2015-01-11 NOTE — Progress Notes (Signed)
At 0645, pt was found standing up at the side of her bed with bed alarm going off.  When attempting to assure her safety by assisting her to sit on the [facility] (which was where she was going) pt put too much weight on one side of the handle of the [facility] and it started to tip.  This tipping action caused the pt to lose her balance,  I had lost my balance to causing me to put a knee down; by doing this pt sat on my knee preventing an uncontrolled decent to the floor.  No injury noted to patient during this encounter.  Reminded pt to not get up without staff assistance.  Bed alarm worked at the time and was reset to the armed position once pt safely back in bed.  Called Barnetta Chapel Shorr,NP to notify of am lactic of 6.0 and also notified of the above.  No new orders regarding above situation.  1L-NS bolus ordered and given for lactic result.  Francia Greaves Johnson,RN,BSN,CCRN

## 2015-01-11 NOTE — Progress Notes (Addendum)
Patient seen and examined, continues to complain of significant abdominal pain, tachycardic in the 120s, Lactic acid of 6.0 this morning  Hypertensive with systolic in the [id number],   White blood cell count 28.1, patient reports diarrhea yesterday  UDS positive for benzodiazepine and opiates   Blood culture urine culture from previous admissions this month were negative to date   Plan #1 sinus tachycardia-suspected to be secondary to dehydration,opiod, benzodiazepine withdrawal, currently on clonidine protocol, will continue to cycle cardiac enzymes, continue aggressive IV hydration. Chest x-ray negative. UA negative. Continue clonidine for when necessary withdrawal.  #2 acute kidney injury-improving with IV fluids, likely secondary to dehydration in the setting of diarrhea, renal ultrasound  #3 lactic acidosis-probably related to dehydration, continue aggressive fluid resuscitation and follow lactic acid. Check pro calcitonin  #4 SIRS-concern for C. difficile, if the patient has diarrhea will check a stool sample, patient started on empiric antibiotics, follow culture results  #5 back pain-nondisplaced sacral fracture, hospitalist discussed with Dr. [name] during prior admission. Weightbearing as tolerated and home health therapy

## 2015-01-12 LAB — LACTIC ACID, PLASMA: Lactic Acid, Venous: 1.1 mmol/L (ref 0.5–2.0)

## 2015-01-12 LAB — URINE CULTURE: CULTURE: NO GROWTH

## 2015-01-12 MED ORDER — HYDRALAZINE HCL 20 MG/ML IJ SOLN
10.0000 mg | Freq: Four times a day (QID) | INTRAMUSCULAR | Status: DC | PRN
  Administered 2015-01-12: 10 mg via INTRAVENOUS
  Filled 2015-01-12: qty 1

## 2015-01-12 MED ORDER — KCL IN DEXTROSE-NACL 20-5-0.45 MEQ/L-%-% IV SOLN
INTRAVENOUS | Status: DC
  Administered 2015-01-12 – 2015-01-13 (×3): via INTRAVENOUS
  Filled 2015-01-12 (×5): qty 1000

## 2015-01-12 MED ORDER — CETYLPYRIDINIUM CHLORIDE 0.05 % MT LIQD
7.0000 mL | Freq: Two times a day (BID) | OROMUCOSAL | Status: DC
  Administered 2015-01-12 – 2015-01-17 (×8): 7 mL via OROMUCOSAL

## 2015-01-12 NOTE — Progress Notes (Signed)
[facility] PROGRESS NOTE  SIDDA KEIMIG [id number] DOB: Oct 24, 1970 DOA: 01/10/2015 PCP: Leonard Downing, MD  Assessment/Plan: Principal Problem:   SIRS (systemic inflammatory response syndrome) -continue antibiotic therapy. No infectious etiology found - Urine culture negative for growth, blood cultures no growth to date  Leukocytosis - Will reassess CBC next a.m. on last check WBC 28.1. Wondering if this is stress reaction to withdrawal versus abdominal discomfort. Again no source of infection identified at this juncture.  Active Problems:   Abdominal pain, generalized -Etiology uncertain. Continue supportive therapy    Lactic acidosis -Etiology uncertain but trending down with IV fluids. May be due to dehydration in lieu of nausea/emesis and abdominal discomfort    Intractable abdominal pain -CT scan obtained on 01/01/2015 and at that point reported no new acute intra-abdominal or pelvic pathology.  Acute renal failure -Most likely prerenal etiology as is currently improving with IV fluid rehydration    Opiate withdrawal -Continue clonidine  Code Status: full Family Communication: no family at bedside (indicate person spoken with, relationship, and if by phone, the number) Disposition Plan: Pending improvement in condition.   Consultants:  None  Procedures:  None  Antibiotics:  Vanc  Pip/Tazo  HPI/Subjective: Patient has no new complaints. No acute issues reported overnight. Still having some nausea  Objective: Filed Vitals:   01/12/15 1202  BP: 177/109  Pulse: 108  Temp: 98.3 F (36.8 C)  Resp: 16    Intake/Output Summary (Last 24 hours) at 01/12/15 1255 Last data filed at 01/12/15 1100  Gross per 24 hour  Intake   2275 ml  Output   1200 ml  Net   1075 ml   Filed Weights   01/10/15 2102 01/11/15 0339 01/12/15 0444  Weight: 77.111 kg (170 lb) 83.2 kg (183 lb 6.8 oz) 77.2 kg (170 lb 3.1 oz)    Exam:   General:  Patient in  no acute distress, alert and awake  Cardiovascular: Regular rate and rhythm, no murmurs or rubs  Respiratory: Clear to auscultation bilaterally, no wheezes  Abdomen: No guarding, nondistended, no rebound tenderness  Musculoskeletal: No cyanosis or clubbing  Data Reviewed: Basic Metabolic Panel:  Recent Labs Lab 01/10/15 2157 01/11/15 0345  NA 139 141  K 3.5 4.0  CL 105 108  CO2 13* 15*  GLUCOSE 192* 169*  BUN 27* 24*  CREATININE 3.88* 3.36*  CALCIUM 9.7 8.6*   Liver Function Tests:  Recent Labs Lab 01/10/15 2157  AST 29  ALT 9*  ALKPHOS 168*  BILITOT 0.6  PROT 9.1*  ALBUMIN 4.4    Recent Labs Lab 01/10/15 2157  LIPASE 17*   No results for input(s): AMMONIA in the last 168 hours. CBC:  Recent Labs Lab 01/10/15 2157  WBC 28.1*  HGB 12.8  HCT 37.3  MCV 90.5  PLT 589*   Cardiac Enzymes:  Recent Labs Lab 01/10/15 2157  TROPONINI <0.03   BNP (last 3 results) No results for input(s): BNP in the last 8760 hours.  ProBNP (last 3 results) No results for input(s): PROBNP in the last 8760 hours.  CBG: No results for input(s): GLUCAP in the last 168 hours.  Recent Results (from the past 240 hour(s))  Urine culture     Status: None   Collection Time: 01/10/15 10:59 PM  Result Value Ref Range Status   Specimen Description URINE, CATHETERIZED  Final   Special Requests NONE  Final   Culture   Final    NO GROWTH 1 DAY Performed at  [facility]    Report Status 01/12/2015 FINAL  Final  Blood Culture (routine x 2)     Status: None (Preliminary result)   Collection Time: 01/11/15  1:31 AM  Result Value Ref Range Status   Specimen Description BLOOD LEFT HAND  Final   Special Requests BOTTLES DRAWN AEROBIC ONLY 1CC  Final   Culture   Final    NO GROWTH 1 DAY Performed at [facility]    Report Status PENDING  Incomplete  Blood Culture (routine x 2)     Status: None (Preliminary result)   Collection Time: 01/11/15  3:45 AM  Result  Value Ref Range Status   Specimen Description BLOOD RIGHT HAND  Final   Special Requests BOTTLES DRAWN AEROBIC ONLY [place]  Final   Culture   Final    NO GROWTH 1 DAY Performed at [facility]    Report Status PENDING  Incomplete  MRSA PCR Screening     Status: None   Collection Time: 01/11/15  6:05 AM  Result Value Ref Range Status   MRSA by PCR NEGATIVE NEGATIVE Final    Comment:        The GeneXpert MRSA Assay (FDA approved for NASAL specimens only), is one component of a comprehensive MRSA colonization surveillance program. It is not intended to diagnose MRSA infection nor to guide or monitor treatment for MRSA infections.      Studies: Dg Abd 1 View  01/11/2015   CLINICAL DATA:  Nausea/vomiting, abdominal pain x1 day 8  EXAM: ABDOMEN - 1 VIEW  COMPARISON:  CT abdomen pelvis dated 01/01/2015  FINDINGS: Nonobstructive bowel gas pattern.  Visualized osseous structures are within normal limits.  IMPRESSION: Unremarkable abdominal radiograph.   Electronically Signed   By: Julian Hy M.D.   On: 01/11/2015 01:58   Dg Chest Port 1 View  01/11/2015   CLINICAL DATA:  Tachycardia  EXAM: PORTABLE CHEST - 1 VIEW  COMPARISON:  01/01/2015  FINDINGS: Lungs are clear.  No pleural effusion or pneumothorax.  The heart is top-normal in size.  IMPRESSION: No evidence of acute cardiopulmonary disease.   Electronically Signed   By: Julian Hy M.D.   On: 01/11/2015 02:01    Scheduled Meds: . amLODipine  10 mg Oral Daily  . antiseptic oral rinse  7 mL Mouth Rinse BID  . cloNIDine  0.1 mg Oral QID   Followed by  . [START ON 01/13/2015] cloNIDine  0.1 mg Oral BH-qamhs   Followed by  . [START ON 01/16/2015] cloNIDine  0.1 mg Oral QAC breakfast  . heparin  5,000 Units Subcutaneous 3 times per day  . lidocaine  1 patch Transdermal Q24H  . piperacillin-tazobactam  3.375 g Intravenous Once  . piperacillin-tazobactam (ZOSYN)  IV  3.375 g Intravenous 3 times per day  . vancomycin   1,000 mg Intravenous Q24H   Continuous Infusions: .  sodium bicarbonate  infusion 1000 mL 75 mL/hr at 01/11/15 1900   Time spent: > 35 minutes  Velvet Bathe  [facility] Pager [phone] If 7PM-7AM, please contact night-coverage at [email or web address], password [facility] 01/12/2015, 12:55 PM  LOS: 1 day

## 2015-01-13 LAB — BASIC METABOLIC PANEL
Anion gap: 11 (ref 5–15)
BUN: 8 mg/dL (ref 6–20)
CO2: 26 mmol/L (ref 22–32)
CREATININE: 2.21 mg/dL — AB (ref 0.44–1.00)
Calcium: 8.4 mg/dL — ABNORMAL LOW (ref 8.9–10.3)
Chloride: 102 mmol/L (ref 101–111)
GFR calc Af Amer: 30 mL/min — ABNORMAL LOW (ref >60)
GFR, EST NON AFRICAN AMERICAN: 26 mL/min — AB (ref >60)
GLUCOSE: 105 mg/dL — AB (ref 65–99)
Potassium: 3 mmol/L — ABNORMAL LOW (ref 3.5–5.1)
SODIUM: 139 mmol/L (ref 135–145)

## 2015-01-13 LAB — CBC
HCT: 35.6 % — ABNORMAL LOW (ref 36.0–46.0)
Hemoglobin: 11.3 g/dL — ABNORMAL LOW (ref 12.0–15.0)
MCH: 30 pg (ref 26.0–34.0)
MCHC: 31.7 g/dL (ref 30.0–36.0)
MCV: 94.4 fL (ref 78.0–100.0)
PLATELETS: 337 10*3/uL (ref 150–400)
RBC: 3.77 MIL/uL — ABNORMAL LOW (ref 3.87–5.11)
RDW: 14.1 % (ref 11.5–15.5)
WBC: 8.7 10*3/uL (ref 4.0–10.5)

## 2015-01-13 MED ORDER — POTASSIUM CHLORIDE CRYS ER 20 MEQ PO TBCR
40.0000 meq | EXTENDED_RELEASE_TABLET | Freq: Once | ORAL | Status: AC
  Administered 2015-01-13: 40 meq via ORAL
  Filled 2015-01-13: qty 2

## 2015-01-13 NOTE — Progress Notes (Signed)
[facility] PROGRESS NOTE  [name] [id number] DOB: Mar 12, 1971 DOA: 01/10/2015 PCP: Leonard Downing, MD  Brief narrative: Patient is a 44 year old that continues to present with abdominal discomfort and has had multiple and many prior admissions for the same. Recent CT scan was negative. There is concern that patient may be having possible withdrawal symptoms and that these admissions may be secondary to that. She is currently on clonidine.   Assessment/Plan: Principal Problem:   SIRS (systemic inflammatory response syndrome) -continue antibiotic therapy. No infectious etiology found - Urine culture negative for growth, blood cultures no growth to date - SIRS physiology resolving.  Leukocytosis - Resolved. Suspect this is stress reaction to withdrawal versus abdominal discomfort. Again no source of infection identified at this juncture.  Hypokalemia -Most likely due to poor oral intake. Will replace orally and reassess next a.m.  Active Problems:   Abdominal pain, generalized -Etiology uncertain. Continue supportive therapy. May be secondary to withdrawal associated symptoms. Recommend after discharge patient follow up with gastroenterologist.    Lactic acidosis -Resolved with IVF rehydration. May have been to dehydration and poor oral intake.    Intractable abdominal pain -CT scan obtained on 01/01/2015 and at that point reported no new acute intra-abdominal or pelvic pathology.  Acute renal failure -Most likely prerenal etiology as is currently improving with IV fluid rehydration - continue IVF and reassess next am.    Opiate withdrawal -Continue clonidine  Code Status: full Family Communication: no family at bedside  Disposition Plan: Pending improvement in condition.   Consultants:  None  Procedures:  None  Antibiotics:  Vanc  Pip/Tazo  HPI/Subjective:  No acute issues reported overnight. Still having some nausea  Objective: Filed  Vitals:   01/13/15 1100  BP: 161/98  Pulse:   Temp:   Resp: 15    Intake/Output Summary (Last 24 hours) at 01/13/15 1228 Last data filed at 01/13/15 0900  Gross per 24 hour  Intake   2300 ml  Output   1925 ml  Net    375 ml   Filed Weights   01/11/15 0339 01/12/15 0444 01/13/15 0349  Weight: 83.2 kg (183 lb 6.8 oz) 77.2 kg (170 lb 3.1 oz) 75.5 kg (166 lb 7.2 oz)    Exam:   General:  Patient in no acute distress, alert and awake  Cardiovascular: Regular rate and rhythm, no murmurs or rubs  Respiratory: Clear to auscultation bilaterally, no wheezes  Abdomen: No guarding, nondistended, no rebound tenderness  Musculoskeletal: No cyanosis or clubbing  Data Reviewed: Basic Metabolic Panel:  Recent Labs Lab 01/10/15 2157 01/11/15 0345 01/13/15 0340  NA 139 141 139  K 3.5 4.0 3.0*  CL 105 108 102  CO2 13* 15* 26  GLUCOSE 192* 169* 105*  BUN 27* 24* 8  CREATININE 3.88* 3.36* 2.21*  CALCIUM 9.7 8.6* 8.4*   Liver Function Tests:  Recent Labs Lab 01/10/15 2157  AST 29  ALT 9*  ALKPHOS 168*  BILITOT 0.6  PROT 9.1*  ALBUMIN 4.4    Recent Labs Lab 01/10/15 2157  LIPASE 17*   No results for input(s): AMMONIA in the last 168 hours. CBC:  Recent Labs Lab 01/10/15 2157 01/13/15 0340  WBC 28.1* 8.7  HGB 12.8 11.3*  HCT 37.3 35.6*  MCV 90.5 94.4  PLT 589* 337   Cardiac Enzymes:  Recent Labs Lab 01/10/15 2157  TROPONINI <0.03   BNP (last 3 results) No results for input(s): BNP in the last 8760 hours.  ProBNP (last  3 results) No results for input(s): PROBNP in the last 8760 hours.  CBG: No results for input(s): GLUCAP in the last 168 hours.  Recent Results (from the past 240 hour(s))  Urine culture     Status: None   Collection Time: 01/10/15 10:59 PM  Result Value Ref Range Status   Specimen Description URINE, CATHETERIZED  Final   Special Requests NONE  Final   Culture   Final    NO GROWTH 1 DAY Performed at [facility]     Report Status 01/12/2015 FINAL  Final  Blood Culture (routine x 2)     Status: None (Preliminary result)   Collection Time: 01/11/15  1:31 AM  Result Value Ref Range Status   Specimen Description BLOOD LEFT HAND  Final   Special Requests BOTTLES DRAWN AEROBIC ONLY [place]  Final   Culture   Final    NO GROWTH 1 DAY Performed at [facility]    Report Status PENDING  Incomplete  Blood Culture (routine x 2)     Status: None (Preliminary result)   Collection Time: 01/11/15  3:45 AM  Result Value Ref Range Status   Specimen Description BLOOD RIGHT HAND  Final   Special Requests BOTTLES DRAWN AEROBIC ONLY [place]  Final   Culture   Final    NO GROWTH 1 DAY Performed at [facility]    Report Status PENDING  Incomplete  MRSA PCR Screening     Status: None   Collection Time: 01/11/15  6:05 AM  Result Value Ref Range Status   MRSA by PCR NEGATIVE NEGATIVE Final    Comment:        The GeneXpert MRSA Assay (FDA approved for NASAL specimens only), is one component of a comprehensive MRSA colonization surveillance program. It is not intended to diagnose MRSA infection nor to guide or monitor treatment for MRSA infections.      Studies: No results found.  Scheduled Meds: . amLODipine  10 mg Oral Daily  . antiseptic oral rinse  7 mL Mouth Rinse BID  . cloNIDine  0.1 mg Oral QID   Followed by  . cloNIDine  0.1 mg Oral BH-qamhs   Followed by  . [START ON 01/16/2015] cloNIDine  0.1 mg Oral QAC breakfast  . heparin  5,000 Units Subcutaneous 3 times per day  . lidocaine  1 patch Transdermal Q24H  . piperacillin-tazobactam  3.375 g Intravenous Once  . piperacillin-tazobactam (ZOSYN)  IV  3.375 g Intravenous 3 times per day  . vancomycin  1,000 mg Intravenous Q24H   Continuous Infusions: . dextrose 5 % and 0.45 % NaCl with KCl 20 mEq/L 100 mL/hr at 01/13/15 0001  .  sodium bicarbonate  infusion 1000 mL Stopped (01/12/15 1600)   Time spent: > 35 minutes  Velvet Bathe  [facility] Pager [phone] If 7PM-7AM, please contact night-coverage at [email or web address], password [facility] 01/13/2015, 12:28 PM  LOS: 2 days

## 2015-01-14 DIAGNOSIS — F1123 Opioid dependence with withdrawal: Secondary | ICD-10-CM

## 2015-01-14 DIAGNOSIS — E872 Acidosis: Secondary | ICD-10-CM

## 2015-01-14 DIAGNOSIS — A419 Sepsis, unspecified organism: Secondary | ICD-10-CM

## 2015-01-14 DIAGNOSIS — R109 Unspecified abdominal pain: Secondary | ICD-10-CM

## 2015-01-14 LAB — BASIC METABOLIC PANEL
ANION GAP: 9 (ref 5–15)
BUN: 12 mg/dL (ref 6–20)
CALCIUM: 8.2 mg/dL — AB (ref 8.9–10.3)
CO2: 23 mmol/L (ref 22–32)
Chloride: 107 mmol/L (ref 101–111)
Creatinine, Ser: 2.04 mg/dL — ABNORMAL HIGH (ref 0.44–1.00)
GFR, EST AFRICAN AMERICAN: 33 mL/min — AB (ref >60)
GFR, EST NON AFRICAN AMERICAN: 29 mL/min — AB (ref >60)
Glucose, Bld: 111 mg/dL — ABNORMAL HIGH (ref 65–99)
POTASSIUM: 3.2 mmol/L — AB (ref 3.5–5.1)
SODIUM: 139 mmol/L (ref 135–145)

## 2015-01-14 LAB — PROTEIN ELECTROPHORESIS, SERUM
A/G Ratio: 0.8 (ref 0.7–1.7)
Albumin ELP: 3.2 g/dL (ref 2.9–4.4)
Alpha-1-Globulin: 0.3 g/dL (ref 0.0–0.4)
Alpha-2-Globulin: 1.3 g/dL — ABNORMAL HIGH (ref 0.4–1.0)
Beta Globulin: 1.1 g/dL (ref 0.7–1.3)
GAMMA GLOBULIN: 1 g/dL (ref 0.4–1.8)
Globulin, Total: 3.8 g/dL (ref 2.2–3.9)
TOTAL PROTEIN ELP: 7 g/dL (ref 6.0–8.5)

## 2015-01-14 LAB — PROTEIN ELECTRO, RANDOM URINE
ALPHA-2-GLOBULIN, U: 23.8 %
Albumin ELP, Urine: 32.9 %
Alpha-1-Globulin, U: 7.9 %
Beta Globulin, U: 28.5 %
Gamma Globulin, U: 6.9 %
TOTAL PROTEIN, URINE-UPE24: 17.8 mg/dL

## 2015-01-14 LAB — MAGNESIUM: MAGNESIUM: 1.5 mg/dL — AB (ref 1.7–2.4)

## 2015-01-14 MED ORDER — HYDROMORPHONE HCL 1 MG/ML IJ SOLN
0.5000 mg | Freq: Four times a day (QID) | INTRAMUSCULAR | Status: DC | PRN
  Administered 2015-01-14 – 2015-01-15 (×6): 0.5 mg via INTRAVENOUS
  Filled 2015-01-14 (×6): qty 1

## 2015-01-14 MED ORDER — POTASSIUM CHLORIDE CRYS ER 20 MEQ PO TBCR
40.0000 meq | EXTENDED_RELEASE_TABLET | ORAL | Status: AC
  Administered 2015-01-14 (×2): 40 meq via ORAL
  Filled 2015-01-14 (×2): qty 2

## 2015-01-14 MED ORDER — BUSPIRONE HCL 5 MG PO TABS
7.5000 mg | ORAL_TABLET | Freq: Three times a day (TID) | ORAL | Status: DC
  Administered 2015-01-14 – 2015-01-17 (×7): 7.5 mg via ORAL
  Filled 2015-01-14 (×4): qty 1.5
  Filled 2015-01-14: qty 2
  Filled 2015-01-14 (×3): qty 1.5

## 2015-01-14 MED ORDER — SACCHAROMYCES BOULARDII 250 MG PO CAPS
250.0000 mg | ORAL_CAPSULE | Freq: Two times a day (BID) | ORAL | Status: DC
  Administered 2015-01-14 – 2015-01-17 (×7): 250 mg via ORAL
  Filled 2015-01-14 (×7): qty 1

## 2015-01-14 MED ORDER — LORAZEPAM 2 MG/ML IJ SOLN
1.0000 mg | Freq: Four times a day (QID) | INTRAMUSCULAR | Status: DC | PRN
  Administered 2015-01-15 – 2015-01-17 (×6): 2 mg via INTRAVENOUS
  Filled 2015-01-14 (×6): qty 1

## 2015-01-14 MED ORDER — SODIUM CHLORIDE 0.9 % IV SOLN
INTRAVENOUS | Status: DC
  Administered 2015-01-14 – 2015-01-16 (×2): via INTRAVENOUS

## 2015-01-14 NOTE — Progress Notes (Signed)
Patient with 3 loose stools. Patient c/o persistent abdominal pain and nausea. NP on call notified. Patient placed on c-diff precautions. Patient advised of precautions and verbalizes understanding. On coming nurse updated on POC.

## 2015-01-14 NOTE — Progress Notes (Signed)
[facility] PROGRESS NOTE  [name] [id number] DOB: [birth year] DOA: 01/10/2015 PCP: Leonard Downing, MD  Brief narrative: Patient is a 44 year old that continues to present with abdominal discomfort and has had multiple and many prior admissions for the same. Recent CT scan was negative. There is concern that patient may be having possible withdrawal symptoms and that these admissions may be secondary to that. She is currently on clonidine.   Assessment/Plan: SIRS (systemic inflammatory response syndrome) -will discontinue antibiotic therapy. No infectious etiology found - Urine culture negative for growth, blood cultures no growth to date - SIRS physiology resolved and appears to be associated with dehydration only  Leukocytosis - Resolved. Suspect this is stress reaction to withdrawal versus abdominal discomfort. Again no source of infection identified at this juncture.  Hypokalemia -Most likely due to poor oral intake. Will replace orally and reassess next a.m.   Lactic acidosis -Resolved with IVF rehydration. May have been to dehydration and poor oral intake.  Intractable abdominal pain -CT scan obtained on 01/01/2015 and at that point reported no new acute intra-abdominal or pelvic pathology. -will repeat C. Diff -will minimize narcotics -continue bentyl and start florastor -GI follow up as an outpatient recommended   Acute renal failure -Most likely prerenal etiology  -improving with IV fluid rehydration -will replete electrolytes   ??Opiate withdrawal -Continue clonidine -on PRN dilaudid  Code Status: full Family Communication: no family at bedside  Disposition Plan: Pending improvement in condition.   Consultants:  None  Procedures:  None  Antibiotics:  Vanc 9/19>>9/23  Pip/Tazo  9/19>>9/23  HPI/Subjective:  no fever. Still having abd pain, nausea, vomiting and diarrhea.   Objective: Filed Vitals:   01/14/15 1957  BP:  162/93  Pulse: 104  Temp: 98.1 F (36.7 C)  Resp: 20    Intake/Output Summary (Last 24 hours) at 01/14/15 2344 Last data filed at 01/14/15 1900  Gross per 24 hour  Intake 2316.66 ml  Output   1400 ml  Net 916.66 ml   Filed Weights   01/11/15 0339 01/12/15 0444 01/13/15 0349  Weight: 83.2 kg (183 lb 6.8 oz) 77.2 kg (170 lb 3.1 oz) 75.5 kg (166 lb 7.2 oz)    Exam:   General:  Patient in no acute distress, alert and awake; complaining of abd pain and also diarrhea  Cardiovascular: Regular rate and rhythm, no murmurs or rubs  Respiratory: Clear to auscultation bilaterally, no wheezes  Abdomen: No guarding, nondistended, positive BS  Musculoskeletal: No cyanosis or clubbing  Data Reviewed: Basic Metabolic Panel:  Recent Labs Lab 01/10/15 2157 01/11/15 0345 01/13/15 0340 01/14/15 0040  NA 139 141 139 139  K 3.5 4.0 3.0* 3.2*  CL 105 108 102 107  CO2 13* 15* 26 23  GLUCOSE 192* 169* 105* 111*  BUN 27* 24* 8 12  CREATININE 3.88* 3.36* 2.21* 2.04*  CALCIUM 9.7 8.6* 8.4* 8.2*  MG  --   --   --  1.5*   Liver Function Tests:  Recent Labs Lab 01/10/15 2157  AST 29  ALT 9*  ALKPHOS 168*  BILITOT 0.6  PROT 9.1*  ALBUMIN 4.4    Recent Labs Lab 01/10/15 2157  LIPASE 17*   No results for input(s): AMMONIA in the last 168 hours. CBC:  Recent Labs Lab 01/10/15 2157 01/13/15 0340  WBC 28.1* 8.7  HGB 12.8 11.3*  HCT 37.3 35.6*  MCV 90.5 94.4  PLT 589* 337   Cardiac Enzymes:  Recent Labs Lab 01/10/15 2157  TROPONINI <0.03   BNP (last 3 results) No results for input(s): BNP in the last 8760 hours.  ProBNP (last 3 results) No results for input(s): PROBNP in the last 8760 hours.  CBG: No results for input(s): GLUCAP in the last 168 hours.  Recent Results (from the past 240 hour(s))  Urine culture     Status: None   Collection Time: 01/10/15 10:59 PM  Result Value Ref Range Status   Specimen Description URINE, CATHETERIZED  Final   Special  Requests NONE  Final   Culture   Final    NO GROWTH 1 DAY Performed at [facility]    Report Status 01/12/2015 FINAL  Final  Blood Culture (routine x 2)     Status: None (Preliminary result)   Collection Time: 01/11/15  1:31 AM  Result Value Ref Range Status   Specimen Description BLOOD LEFT HAND  Final   Special Requests BOTTLES DRAWN AEROBIC ONLY [place]  Final   Culture   Final    NO GROWTH 3 DAYS Performed at [facility]    Report Status PENDING  Incomplete  Blood Culture (routine x 2)     Status: None (Preliminary result)   Collection Time: 01/11/15  3:45 AM  Result Value Ref Range Status   Specimen Description BLOOD RIGHT HAND  Final   Special Requests BOTTLES DRAWN AEROBIC ONLY [place]  Final   Culture   Final    NO GROWTH 3 DAYS Performed at [facility]    Report Status PENDING  Incomplete  MRSA PCR Screening     Status: None   Collection Time: 01/11/15  6:05 AM  Result Value Ref Range Status   MRSA by PCR NEGATIVE NEGATIVE Final    Comment:        The GeneXpert MRSA Assay (FDA approved for NASAL specimens only), is one component of a comprehensive MRSA colonization surveillance program. It is not intended to diagnose MRSA infection nor to guide or monitor treatment for MRSA infections.      Studies: No results found.  Scheduled Meds: . amLODipine  10 mg Oral Daily  . antiseptic oral rinse  7 mL Mouth Rinse BID  . busPIRone  7.5 mg Oral TID  . cloNIDine  0.1 mg Oral BH-qamhs   Followed by  . [START ON 01/16/2015] cloNIDine  0.1 mg Oral QAC breakfast  . heparin  5,000 Units Subcutaneous 3 times per day  . lidocaine  1 patch Transdermal Q24H  . saccharomyces boulardii  250 mg Oral BID   Continuous Infusions: . sodium chloride 75 mL/hr at 01/14/15 0844   Time spent: 30 minutes  Barton Dubois  [facility] Pager [phone] If 7PM-7AM, please contact night-coverage at [email or web address], password [facility] 01/14/2015, 11:44 PM  LOS: 3 days

## 2015-01-15 DIAGNOSIS — E876 Hypokalemia: Secondary | ICD-10-CM

## 2015-01-15 LAB — BASIC METABOLIC PANEL
Anion gap: 11 (ref 5–15)
BUN: 6 mg/dL (ref 6–20)
CALCIUM: 9.2 mg/dL (ref 8.9–10.3)
CHLORIDE: 111 mmol/L (ref 101–111)
CO2: 20 mmol/L — AB (ref 22–32)
CREATININE: 1.59 mg/dL — AB (ref 0.44–1.00)
GFR calc Af Amer: 45 mL/min — ABNORMAL LOW (ref >60)
GFR calc non Af Amer: 39 mL/min — ABNORMAL LOW (ref >60)
GLUCOSE: 98 mg/dL (ref 65–99)
Potassium: 3.6 mmol/L (ref 3.5–5.1)
Sodium: 142 mmol/L (ref 135–145)

## 2015-01-15 LAB — C DIFFICILE QUICK SCREEN W PCR REFLEX
C DIFFICLE (CDIFF) ANTIGEN: POSITIVE — AB
C Diff toxin: NEGATIVE

## 2015-01-15 MED ORDER — OXYCODONE HCL 5 MG PO TABS
5.0000 mg | ORAL_TABLET | Freq: Four times a day (QID) | ORAL | Status: DC | PRN
  Administered 2015-01-16 – 2015-01-17 (×5): 5 mg via ORAL
  Filled 2015-01-15 (×5): qty 1

## 2015-01-15 MED ORDER — DICYCLOMINE HCL 20 MG PO TABS
20.0000 mg | ORAL_TABLET | Freq: Three times a day (TID) | ORAL | Status: DC
  Administered 2015-01-16 – 2015-01-17 (×5): 20 mg via ORAL
  Filled 2015-01-15 (×8): qty 1

## 2015-01-15 MED ORDER — HYDROMORPHONE HCL 1 MG/ML IJ SOLN
0.5000 mg | Freq: Three times a day (TID) | INTRAMUSCULAR | Status: DC | PRN
  Administered 2015-01-16 – 2015-01-17 (×4): 0.5 mg via INTRAVENOUS
  Filled 2015-01-15 (×4): qty 1

## 2015-01-15 NOTE — Progress Notes (Signed)
[facility] PROGRESS NOTE  [name] [id number] DOB: 04/18/1971 DOA: 01/10/2015 PCP: Leonard Downing, MD  Brief narrative: Patient is a 44 year old that continues to present with abdominal discomfort and has had multiple and many prior admissions for the same. Recent CT scan was negative. There is concern that patient may be having possible withdrawal symptoms and that these admissions may be secondary to that. She is currently on clonidine.   Assessment/Plan: SIRS (systemic inflammatory response syndrome) -will discontinue antibiotic therapy. No infectious etiology found - Urine culture negative for growth, blood cultures no growth to date - SIRS physiology resolved and appears to be associated with dehydration only  Leukocytosis - Resolved. Suspect this is stress reaction to withdrawal versus abdominal discomfort. Again no source of infection identified at this juncture.  Hypokalemia -Most likely due to poor oral intake.  -will monitor and continue repletion as needed   Lactic acidosis -Resolved with IVF rehydration. May have been to dehydration and poor oral intake.  Intractable abdominal pain -CT scan obtained on 01/01/2015 and at that point reported no new acute intra-abdominal or pelvic pathology. -will repeat C. Diff (pending, sample unable to be obtained) -will minimize narcotics use -continue bentyl, now schedule and continue florastor -GI follow up as an outpatient recommended   Acute renal failure -Most likely prerenal etiology  -improving with IV fluid rehydration -will continue repleting electrolytes as needed -Cr 1.59  ??Opiate withdrawal -Continue clonidine therapy -on PRN dilaudid and oxycodone (taparing them off)  Code Status: full Family Communication: no family at bedside  Disposition Plan: Pending improvement in condition.   Consultants:  None  Procedures:  None  Antibiotics:  Vanc 9/19>>9/23  Pip/Tazo   9/19>>9/23  HPI/Subjective:  no fever, no further vomiting. Still having abd pain and nausea. diarrhea resolving and with increase in consistency   Objective: Filed Vitals:   01/15/15 1856  BP: 157/91  Pulse:   Temp:   Resp:     Intake/Output Summary (Last 24 hours) at 01/15/15 1903 Last data filed at 01/15/15 1855  Gross per 24 hour  Intake   2040 ml  Output    600 ml  Net   1440 ml   Filed Weights   01/11/15 0339 01/12/15 0444 01/13/15 0349  Weight: 83.2 kg (183 lb 6.8 oz) 77.2 kg (170 lb 3.1 oz) 75.5 kg (166 lb 7.2 oz)    Exam:   General:  Patient in no acute distress, alert and awake; still complaining of abd pain and nausea; reports BM's are improving   Cardiovascular: Regular rate and rhythm, no murmurs or rubs  Respiratory: Clear to auscultation bilaterally, no wheezes  Abdomen: No guarding, nondistended, positive BS  Musculoskeletal: No cyanosis or clubbing  Data Reviewed: Basic Metabolic Panel:  Recent Labs Lab 01/10/15 2157 01/11/15 0345 01/13/15 0340 01/14/15 0040 01/15/15 0759  NA 139 141 139 139 142  K 3.5 4.0 3.0* 3.2* 3.6  CL 105 108 102 107 111  CO2 13* 15* 26 23 20*  GLUCOSE 192* 169* 105* 111* 98  BUN 27* 24* 8 12 6   CREATININE 3.88* 3.36* 2.21* 2.04* 1.59*  CALCIUM 9.7 8.6* 8.4* 8.2* 9.2  MG  --   --   --  1.5*  --    Liver Function Tests:  Recent Labs Lab 01/10/15 2157  AST 29  ALT 9*  ALKPHOS 168*  BILITOT 0.6  PROT 9.1*  ALBUMIN 4.4    Recent Labs Lab 01/10/15 2157  LIPASE 17*   CBC:  Recent Labs Lab 01/10/15 2157 01/13/15 0340  WBC 28.1* 8.7  HGB 12.8 11.3*  HCT 37.3 35.6*  MCV 90.5 94.4  PLT 589* 337   Cardiac Enzymes:  Recent Labs Lab 01/10/15 2157  TROPONINI <0.03   CBG: No results for input(s): GLUCAP in the last 168 hours.  Recent Results (from the past 240 hour(s))  Urine culture     Status: None   Collection Time: 01/10/15 10:59 PM  Result Value Ref Range Status   Specimen Description  URINE, CATHETERIZED  Final   Special Requests NONE  Final   Culture   Final    NO GROWTH 1 DAY Performed at [facility]    Report Status 01/12/2015 FINAL  Final  Blood Culture (routine x 2)     Status: None (Preliminary result)   Collection Time: 01/11/15  1:31 AM  Result Value Ref Range Status   Specimen Description BLOOD LEFT HAND  Final   Special Requests BOTTLES DRAWN AEROBIC ONLY [place]  Final   Culture   Final    NO GROWTH 4 DAYS Performed at [facility]    Report Status PENDING  Incomplete  Blood Culture (routine x 2)     Status: None (Preliminary result)   Collection Time: 01/11/15  3:45 AM  Result Value Ref Range Status   Specimen Description BLOOD RIGHT HAND  Final   Special Requests BOTTLES DRAWN AEROBIC ONLY [place]  Final   Culture   Final    NO GROWTH 4 DAYS Performed at [facility]    Report Status PENDING  Incomplete  MRSA PCR Screening     Status: None   Collection Time: 01/11/15  6:05 AM  Result Value Ref Range Status   MRSA by PCR NEGATIVE NEGATIVE Final    Comment:        The GeneXpert MRSA Assay (FDA approved for NASAL specimens only), is one component of a comprehensive MRSA colonization surveillance program. It is not intended to diagnose MRSA infection nor to guide or monitor treatment for MRSA infections.      Studies: No results found.  Scheduled Meds: . amLODipine  10 mg Oral Daily  . antiseptic oral rinse  7 mL Mouth Rinse BID  . busPIRone  7.5 mg Oral TID  . [START ON 01/16/2015] cloNIDine  0.1 mg Oral QAC breakfast  . [START ON 01/16/2015] dicyclomine  20 mg Oral TID AC  . heparin  5,000 Units Subcutaneous 3 times per day  . lidocaine  1 patch Transdermal Q24H  . saccharomyces boulardii  250 mg Oral BID   Continuous Infusions: . sodium chloride 75 mL/hr at 01/14/15 0844   Time spent: 30 minutes  Barton Dubois  [facility] Pager [phone] If 7PM-7AM, please contact night-coverage at [email or web address],  password [facility] 01/15/2015, 7:03 PM  LOS: 4 days

## 2015-01-16 DIAGNOSIS — B9689 Other specified bacterial agents as the cause of diseases classified elsewhere: Secondary | ICD-10-CM

## 2015-01-16 LAB — BASIC METABOLIC PANEL
Anion gap: 11 (ref 5–15)
BUN: 9 mg/dL (ref 6–20)
CHLORIDE: 109 mmol/L (ref 101–111)
CO2: 22 mmol/L (ref 22–32)
Calcium: 9.1 mg/dL (ref 8.9–10.3)
Creatinine, Ser: 1.6 mg/dL — ABNORMAL HIGH (ref 0.44–1.00)
GFR calc non Af Amer: 39 mL/min — ABNORMAL LOW (ref >60)
GFR, EST AFRICAN AMERICAN: 45 mL/min — AB (ref >60)
Glucose, Bld: 111 mg/dL — ABNORMAL HIGH (ref 65–99)
POTASSIUM: 3.2 mmol/L — AB (ref 3.5–5.1)
SODIUM: 142 mmol/L (ref 135–145)

## 2015-01-16 LAB — CULTURE, BLOOD (ROUTINE X 2)
CULTURE: NO GROWTH
Culture: NO GROWTH

## 2015-01-16 LAB — MAGNESIUM: MAGNESIUM: 1.6 mg/dL — AB (ref 1.7–2.4)

## 2015-01-16 MED ORDER — POTASSIUM CHLORIDE CRYS ER 20 MEQ PO TBCR
40.0000 meq | EXTENDED_RELEASE_TABLET | ORAL | Status: AC
  Administered 2015-01-16 (×3): 40 meq via ORAL
  Filled 2015-01-16 (×3): qty 2

## 2015-01-16 MED ORDER — FIDAXOMICIN 200 MG PO TABS
200.0000 mg | ORAL_TABLET | Freq: Two times a day (BID) | ORAL | Status: DC
  Administered 2015-01-16 – 2015-01-17 (×3): 200 mg via ORAL
  Filled 2015-01-16 (×4): qty 1

## 2015-01-16 MED ORDER — MAGNESIUM SULFATE 2 GM/50ML IV SOLN
2.0000 g | Freq: Once | INTRAVENOUS | Status: AC
  Administered 2015-01-16: 2 g via INTRAVENOUS
  Filled 2015-01-16: qty 50

## 2015-01-16 NOTE — Progress Notes (Signed)
[facility] PROGRESS NOTE  [name] [id number] DOB: 05/13/1970 DOA: 01/10/2015 PCP: Leonard Downing, MD  Brief narrative: Patient is a 44 year old that continues to present with abdominal discomfort and has had multiple and many prior admissions for the same. Recent CT scan was negative. There is concern that patient may be having possible withdrawal symptoms and that these admissions may be secondary to that. She is currently on clonidine.   Assessment/Plan: SIRS (systemic inflammatory response syndrome) -patient has remained without signs of severe infection after been off abx;s for 48 hours - Urine culture negative for growth, blood cultures no growth to date - SIRS physiology resolved and appears to be associated with dehydration in major aspect of presentation -will treat with dificid for presumed colitis and colonization of c. Diff  Leukocytosis - Resolved. Suspect multifactorial given demargination from severe dehydration and also presumed colitis from C. Diff -WBC's has now remained in normal range -no fever -will treat with dificid   Hypokalemia -Most likely due to poor oral intake.  -will monitor and continue repletion as needed   Lactic acidosis -Resolved with IVF rehydration. May have been to dehydration and poor oral intake.  Intractable abdominal pain/IBS and presumed narcotic dependency  -CT scan obtained on 01/01/2015 and at that point reported no new acute intra-abdominal or pelvic pathology. -C. Diff colonization and presumed colitis positive -will minimize narcotics use and try to avoid IV -continue bentyl, now schedule and continue florastor -GI follow up as an outpatient recommended  -will treat as mentioned below with dificid for 10 days  Acute renal failure -Most likely prerenal etiology  -improved with IV fluids resuscitation  -patient drinking/eating more -less GI loses and able to catch up now -will continue repleting  electrolytes as needed -Cr 1.59-1.60  C. Diff colonization and colitis: patient with abd pain, diarrhea and presentation of SIRS/sepsis due to infection most likely -she endorses using vancomycin orally once and flagyl in the past -patient would be started on dificid and the plan is to treat for 10 days. -no PPI's -will follow response and most likely d/c in AM  Code Status: full Family Communication: no family at bedside  Disposition Plan: Pending improvement in condition.   Consultants:  None  Procedures:  None  Antibiotics:  Vanc 9/19>>9/23  Pip/Tazo  9/19>>9/23  dificid 9/25  HPI/Subjective: No fever, no further vomiting. Still having intermittent abd pain and nausea, but much improved. Diarrhea resolving and less frequent. Patient C. Diff testing positive for antigen, but negative for presence of C. diff toxins.   Objective: Filed Vitals:   01/16/15 0700  BP: 146/95  Pulse: 100  Temp:   Resp:     Intake/Output Summary (Last 24 hours) at 01/16/15 1132 Last data filed at 01/16/15 0601  Gross per 24 hour  Intake   1020 ml  Output   1375 ml  Net   -355 ml   Filed Weights   01/11/15 0339 01/12/15 0444 01/13/15 0349  Weight: 83.2 kg (183 lb 6.8 oz) 77.2 kg (170 lb 3.1 oz) 75.5 kg (166 lb 7.2 oz)    Exam:   General:  Patient in no acute distress, alert, awake and oriented X 3; still complaining of abd pain (but improved) and some intermittent nausea; reports less diarrhea/loose stool. No vomiting   Cardiovascular: Regular rate and rhythm, no murmurs or rubs  Respiratory: Clear to auscultation bilaterally, no wheezes  Abdomen: No guarding, nondistended, positive BS  Musculoskeletal: No cyanosis or clubbing  Data Reviewed:  Basic Metabolic Panel:  Recent Labs Lab 01/11/15 0345 01/13/15 0340 01/14/15 0040 01/15/15 0759 01/16/15 0427  NA 141 139 139 142 142  K 4.0 3.0* 3.2* 3.6 3.2*  CL 108 102 107 111 109  CO2 15* 26 23 20* 22  GLUCOSE 169*  105* 111* 98 111*  BUN 24* 8 12 6 9   CREATININE 3.36* 2.21* 2.04* 1.59* 1.60*  CALCIUM 8.6* 8.4* 8.2* 9.2 9.1  MG  --   --  1.5*  --  1.6*   Liver Function Tests:  Recent Labs Lab 01/10/15 2157  AST 29  ALT 9*  ALKPHOS 168*  BILITOT 0.6  PROT 9.1*  ALBUMIN 4.4    Recent Labs Lab 01/10/15 2157  LIPASE 17*   CBC:  Recent Labs Lab 01/10/15 2157 01/13/15 0340  WBC 28.1* 8.7  HGB 12.8 11.3*  HCT 37.3 35.6*  MCV 90.5 94.4  PLT 589* 337   Cardiac Enzymes:  Recent Labs Lab 01/10/15 2157  TROPONINI <0.03   CBG: No results for input(s): GLUCAP in the last 168 hours.  Recent Results (from the past 240 hour(s))  Urine culture     Status: None   Collection Time: 01/10/15 10:59 PM  Result Value Ref Range Status   Specimen Description URINE, CATHETERIZED  Final   Special Requests NONE  Final   Culture   Final    NO GROWTH 1 DAY Performed at [facility]    Report Status 01/12/2015 FINAL  Final  Blood Culture (routine x 2)     Status: None (Preliminary result)   Collection Time: 01/11/15  1:31 AM  Result Value Ref Range Status   Specimen Description BLOOD LEFT HAND  Final   Special Requests BOTTLES DRAWN AEROBIC ONLY [place]  Final   Culture   Final    NO GROWTH 4 DAYS Performed at [facility]    Report Status PENDING  Incomplete  Blood Culture (routine x 2)     Status: None (Preliminary result)   Collection Time: 01/11/15  3:45 AM  Result Value Ref Range Status   Specimen Description BLOOD RIGHT HAND  Final   Special Requests BOTTLES DRAWN AEROBIC ONLY [place]  Final   Culture   Final    NO GROWTH 4 DAYS Performed at [facility]    Report Status PENDING  Incomplete  MRSA PCR Screening     Status: None   Collection Time: 01/11/15  6:05 AM  Result Value Ref Range Status   MRSA by PCR NEGATIVE NEGATIVE Final    Comment:        The GeneXpert MRSA Assay (FDA approved for NASAL specimens only), is one component of a comprehensive MRSA  colonization surveillance program. It is not intended to diagnose MRSA infection nor to guide or monitor treatment for MRSA infections.   C difficile quick scan w PCR reflex     Status: Abnormal   Collection Time: 01/15/15 10:37 PM  Result Value Ref Range Status   C Diff antigen POSITIVE (A) NEGATIVE Final   C Diff toxin NEGATIVE NEGATIVE Final   C Diff interpretation   Final    C. difficile present, but toxin not detected. This indicates colonization. In most cases, this does not require treatment. If patient has signs and symptoms consistent with colitis, consider treatment.     Studies: No results found.  Scheduled Meds: . amLODipine  10 mg Oral Daily  . antiseptic oral rinse  7 mL Mouth Rinse BID  .  busPIRone  7.5 mg Oral TID  . cloNIDine  0.1 mg Oral QAC breakfast  . dicyclomine  20 mg Oral TID AC  . fidaxomicin  200 mg Oral BID  . heparin  5,000 Units Subcutaneous 3 times per day  . lidocaine  1 patch Transdermal Q24H  . magnesium sulfate 1 - 4 g bolus IVPB  2 g Intravenous Once  . potassium chloride  40 mEq Oral Q4H  . saccharomyces boulardii  250 mg Oral BID   Continuous Infusions: . sodium chloride 50 mL/hr at 01/15/15 1958   Time spent: 30 minutes  Barton Dubois  [facility] Pager [phone] If 7PM-7AM, please contact night-coverage at [email or web address], password [facility] 01/16/2015, 11:32 AM  LOS: 5 days

## 2015-01-16 NOTE — Progress Notes (Signed)
C-diff toxin is negative. C-diff antigen is positive.

## 2015-01-17 DIAGNOSIS — D72829 Elevated white blood cell count, unspecified: Secondary | ICD-10-CM

## 2015-01-17 DIAGNOSIS — N179 Acute kidney failure, unspecified: Secondary | ICD-10-CM | POA: Insufficient documentation

## 2015-01-17 DIAGNOSIS — A047 Enterocolitis due to Clostridium difficile: Secondary | ICD-10-CM

## 2015-01-17 DIAGNOSIS — A0472 Enterocolitis due to Clostridium difficile, not specified as recurrent: Secondary | ICD-10-CM | POA: Insufficient documentation

## 2015-01-17 LAB — BASIC METABOLIC PANEL
ANION GAP: 8 (ref 5–15)
BUN: 11 mg/dL (ref 6–20)
CHLORIDE: 112 mmol/L — AB (ref 101–111)
CO2: 20 mmol/L — ABNORMAL LOW (ref 22–32)
Calcium: 9 mg/dL (ref 8.9–10.3)
Creatinine, Ser: 1.61 mg/dL — ABNORMAL HIGH (ref 0.44–1.00)
GFR calc Af Amer: 44 mL/min — ABNORMAL LOW (ref >60)
GFR calc non Af Amer: 38 mL/min — ABNORMAL LOW (ref >60)
GLUCOSE: 98 mg/dL (ref 65–99)
POTASSIUM: 4.1 mmol/L (ref 3.5–5.1)
Sodium: 140 mmol/L (ref 135–145)

## 2015-01-17 LAB — MAGNESIUM: MAGNESIUM: 2.3 mg/dL (ref 1.7–2.4)

## 2015-01-17 MED ORDER — DIAZEPAM 2 MG PO TABS: 2.0000 mg | ORAL_TABLET | Freq: Two times a day (BID) | ORAL | Status: DC | PRN

## 2015-01-17 MED ORDER — BUSPIRONE HCL 7.5 MG PO TABS
7.5000 mg | ORAL_TABLET | Freq: Three times a day (TID) | ORAL | Status: DC
Start: 2015-01-17 — End: 2015-01-17

## 2015-01-17 MED ORDER — SACCHAROMYCES BOULARDII 250 MG PO CAPS: 250.0000 mg | ORAL_CAPSULE | Freq: Two times a day (BID) | ORAL | Status: DC

## 2015-01-17 MED ORDER — HYDROCODONE-ACETAMINOPHEN 10-325 MG PO TABS: 1.0000 | ORAL_TABLET | Freq: Three times a day (TID) | ORAL | Status: DC | PRN

## 2015-01-17 MED ORDER — DICYCLOMINE HCL 20 MG PO TABS: 20.0000 mg | ORAL_TABLET | Freq: Three times a day (TID) | ORAL | Status: DC

## 2015-01-17 MED ORDER — FIDAXOMICIN 200 MG PO TABS: 200.0000 mg | ORAL_TABLET | Freq: Two times a day (BID) | ORAL | Status: AC

## 2015-01-17 MED ORDER — BUSPIRONE HCL 7.5 MG PO TABS: 7.5000 mg | ORAL_TABLET | Freq: Three times a day (TID) | ORAL | Status: DC

## 2015-01-17 MED ORDER — FIDAXOMICIN 200 MG PO TABS: 200.0000 mg | ORAL_TABLET | Freq: Two times a day (BID) | ORAL | Status: DC

## 2015-01-17 NOTE — Discharge Summary (Signed)
Physician Discharge Summary  [name] [id number] DOB: June 03, 1970 DOA: 01/10/2015  PCP: Leonard Downing, MD  Admit date: 01/10/2015 Discharge date: 01/17/2015  Time spent: 40 minutes  Recommendations for Outpatient Follow-up:  1. Repeat BMET to assess renal function and electrolytes 2. Please make outpatient referral with GI for further evaluation of chronic abd pain and potential IBS 3. Check CBC to follow WBC's trend   Discharge Diagnoses:  Principal Problem:   SIRS (systemic inflammatory response syndrome) Active Problems:   Abdominal pain, generalized   Lactic acidosis   Intractable abdominal pain   Opiate withdrawal   Hypomagnesemia   AKI (acute kidney injury)   C. difficile colitis anxiety Essential HTN   Discharge Condition: stable and improved; discharge home with instructions to follow with PCP in 10 days.  Diet recommendation: heart healthy diet  Filed Weights   01/11/15 0339 01/12/15 0444 01/13/15 0349  Weight: 83.2 kg (183 lb 6.8 oz) 77.2 kg (170 lb 3.1 oz) 75.5 kg (166 lb 7.2 oz)    History of present illness:  44 y.o. female with 4 recent admits over past months for abdominal pain, emesis, tachycardia, lactic acidosis. Each time treated with zosyn/vanc empirically as well as having possible withdrawal symptoms treated, followed by subsequent improvement. Patient presents to ED once again with identical symptoms. Patient insists that she took last norco this morning, has not taken any benzos since last discharge, and her PCP wont refill meds and she dosent know why.  Hospital Course:  SIRS (systemic inflammatory response syndrome) -patient has remained without signs of severe infection after been off abx's for 48 hours - Urine culture negative for growth, blood cultures no growth to date - SIRS physiology resolved and appears to be associated with dehydration in major aspect of presentation and c. Diff colitis -will treat with dificid for  presumed colitis and colonization of c. Diff  Leukocytosis - Resolved. Suspect multifactorial given demargination from severe dehydration and also presumed colitis from C. Diff -WBC's has now remained in normal range -no fever at discharge -will received treatment with dificid   Hypokalemia -Most likely due to poor oral intake and GI loses -repleted and WNL at discharge  Lactic acidosis -Resolved with IVF rehydration. May have been to dehydration and poor oral intake.  Intractable abdominal pain/IBS and presumed narcotic dependency  -CT scan obtained on 01/01/2015 and at that point reported no new acute intra-abdominal or pelvic pathology. -C. Diff colonization and presumed colitis positive -will minimize narcotics use  -continue bentyl, now schedule and continue florastor -GI follow up as an outpatient recommended  -will treat as mentioned below with dificid for a total of 10 days (2/10 at discharge)  Acute renal failure -Most likely prerenal etiology  -improved with IV fluids resuscitation  -patient drinking/eating more at discharge; less GI loses and able to catch up now -advise to keep herself well hydrated -Cr 1.59-1.60 at discharge -BMET as an outpatient to follow renal function  C. Diff colonization and colitis: patient with abd pain, diarrhea and presentation of SIRS/sepsis due to infection most likely -she endorses using vancomycin orally once and flagyl in the past as well -patient would be started on dificid and the plan is to treat for a total of 10 days. -no PPI's -BID florastor  HTN -will continue norvasc and low sodium/heart healthy diet  Anxiety: patient started on buspar   Procedures:  See below for x-ray reports   Consultations:  None   Discharge Exam: Filed Vitals:  01/17/15 0830  BP: 167/106  Pulse: 111  Temp: 98 F (36.7 C)  Resp: 18    General: Patient in no acute distress, alert, awake and oriented X 3; reports no further  nausea/vomiting and significant improvement on her loose stools   Cardiovascular: Regular rate and rhythm, no murmurs or rubs  Respiratory: Clear to auscultation bilaterally, good air movement  Abdomen: No guarding, nondistended, positive BS  Musculoskeletal: No cyanosis or clubbing  Discharge Instructions   Discharge Instructions    Diet - low sodium heart healthy    Complete by:  As directed      Discharge instructions    Complete by:  As directed   Take medications as prescribed Arrange follow up with PCP in 10 days Keep yourself well hydrated          Current Discharge Medication List    START taking these medications   Details  busPIRone (BUSPAR) 7.5 MG tablet Take 1 tablet (7.5 mg total) by mouth 3 (three) times daily. Qty: 90 tablet, Refills: 0    dicyclomine (BENTYL) 20 MG tablet Take 1 tablet (20 mg total) by mouth 3 (three) times daily before meals. Qty: 90 tablet, Refills: 0    fidaxomicin (DIFICID) 200 MG TABS tablet Take 1 tablet (200 mg total) by mouth 2 (two) times daily. Qty: 16 tablet, Refills: 0    saccharomyces boulardii (FLORASTOR) 250 MG capsule Take 1 capsule (250 mg total) by mouth 2 (two) times daily. Qty: 60 capsule, Refills: 0      CONTINUE these medications which have CHANGED   Details  diazepam (VALIUM) 2 MG tablet Take 1 tablet (2 mg total) by mouth every 12 (twelve) hours as needed for anxiety. Qty: 10 tablet, Refills: 0    HYDROcodone-acetaminophen (NORCO) 10-325 MG per tablet Take 1 tablet by mouth every 8 (eight) hours as needed for moderate pain.      CONTINUE these medications which have NOT CHANGED   Details  amLODipine (NORVASC) 10 MG tablet Take 1 tablet (10 mg total) by mouth daily. Qty: 30 tablet, Refills: 2    DULoxetine (CYMBALTA) 30 MG capsule Take 1 capsule (30 mg total) by mouth daily. Qty: 30 capsule, Refills: 0    ondansetron (ZOFRAN ODT) 4 MG disintegrating tablet Take 1 tablet (4 mg total) by mouth every 8  (eight) hours as needed for nausea or vomiting. Qty: 20 tablet, Refills: 0      STOP taking these medications     docusate sodium (COLACE) 100 MG capsule      polyethylene glycol (MIRALAX / GLYCOLAX) packet      tiZANidine (ZANAFLEX) 4 MG tablet        Allergies  Allergen Reactions  . Lactose Intolerance (Gi) Nausea Only   Follow-up Information    Follow up with Leonard Downing, MD. Schedule an appointment as soon as possible for a visit in 10 days.   Specialty:  Family Medicine   Contact information:   [place] [id number] [phone]        The results of significant diagnostics from this hospitalization (including imaging, microbiology, ancillary and laboratory) are listed below for reference.    Significant Diagnostic Studies: Dg Abd 1 View  01/11/2015   CLINICAL DATA:  Nausea/vomiting, abdominal pain x1 day 8  EXAM: ABDOMEN - 1 VIEW  COMPARISON:  CT abdomen pelvis dated 01/01/2015  FINDINGS: Nonobstructive bowel gas pattern.  Visualized osseous structures are within normal limits.  IMPRESSION: Unremarkable abdominal radiograph.  Electronically Signed   By: Julian Hy M.D.   On: 01/11/2015 01:58   Ct Abdomen Pelvis W Contrast  01/01/2015   CLINICAL DATA:  Back pain, patient reports sacral fracture 2 weeks ago  EXAM: CT ABDOMEN AND PELVIS WITH CONTRAST  TECHNIQUE: Multidetector CT imaging of the abdomen and pelvis was performed using the standard protocol following bolus administration of intravenous contrast.  CONTRAST:  187mL OMNIPAQUE IOHEXOL 300 MG/ML  SOLN  COMPARISON:  CT abdomen/ pelvis 12/12/2014  FINDINGS: Lower chest:  Lung bases are clear.  No pleural effusion.  Hepatobiliary: Mild hepatic hypodensity which could indicate a degree of steatosis but no focal abnormality is seen. Gallbladder is normal.  Pancreas: Normal  Spleen: Normal  Adrenals/Urinary Tract: Adrenal glands appear normal. Kidneys are unremarkable. No  hydroureteronephrosis. The bladder is distended but otherwise unremarkable.  Stomach/Bowel: Stomach and bowel appear normal.  Appendix is normal.  Vascular/Lymphatic: No aortic aneurysm.  No lymphadenopathy.  Other: Uterus and ovaries are normal.  No free air.  Musculoskeletal: Right sacral fractures reidentified with associated presacral stranding/fluid. This is not significantly changed since previously allowing for technique.  IMPRESSION: Right sacral fracture reidentified with associated presacral stranding/fluid. No new acute intra-abdominal or pelvic pathology.   Electronically Signed   By: Conchita Paris M.D.   On: 01/01/2015 15:08   Dg Chest Port 1 View  01/11/2015   CLINICAL DATA:  Tachycardia  EXAM: PORTABLE CHEST - 1 VIEW  COMPARISON:  01/01/2015  FINDINGS: Lungs are clear.  No pleural effusion or pneumothorax.  The heart is top-normal in size.  IMPRESSION: No evidence of acute cardiopulmonary disease.   Electronically Signed   By: Julian Hy M.D.   On: 01/11/2015 02:01   Dg Chest Portable 1 View  01/01/2015   CLINICAL DATA:  Tachycardia, lactic acidosis, sepsis  EXAM: PORTABLE CHEST - 1 VIEW  COMPARISON:  12/16/2014  FINDINGS: Contour abnormality of the medial left diaphragm is a diaphragmatic hernia based on June 2016 chest CT.  Normal heart size and mediastinal contours. No acute infiltrate or edema. No effusion or pneumothorax. No acute osseous findings.  IMPRESSION: No acute finding or explanation for sepsis.   Electronically Signed   By: Monte Fantasia M.D.   On: 01/01/2015 13:06   Dg Hand Complete Right  01/02/2015   CLINICAL DATA:  Acute right hand swelling without known injury. Initial encounter.  EXAM: RIGHT HAND - COMPLETE 3+ VIEW  COMPARISON:  None.  FINDINGS: There is no evidence of fracture or dislocation. There is no evidence of arthropathy or other focal bone abnormality. Soft tissues are unremarkable.  IMPRESSION: Normal right hand.   Electronically Signed   By: Marijo Conception, M.D.   On: 01/02/2015 14:27    Microbiology: Recent Results (from the past 240 hour(s))  Urine culture     Status: None   Collection Time: 01/10/15 10:59 PM  Result Value Ref Range Status   Specimen Description URINE, CATHETERIZED  Final   Special Requests NONE  Final   Culture   Final    NO GROWTH 1 DAY Performed at [facility]    Report Status 01/12/2015 FINAL  Final  Blood Culture (routine x 2)     Status: None   Collection Time: 01/11/15  1:31 AM  Result Value Ref Range Status   Specimen Description BLOOD LEFT HAND  Final   Special Requests BOTTLES DRAWN AEROBIC ONLY [place]  Final   Culture   Final  NO GROWTH 5 DAYS Performed at [facility]    Report Status 01/16/2015 FINAL  Final  Blood Culture (routine x 2)     Status: None   Collection Time: 01/11/15  3:45 AM  Result Value Ref Range Status   Specimen Description BLOOD RIGHT HAND  Final   Special Requests BOTTLES DRAWN AEROBIC ONLY [place]  Final   Culture   Final    NO GROWTH 5 DAYS Performed at [facility]    Report Status 01/16/2015 FINAL  Final  MRSA PCR Screening     Status: None   Collection Time: 01/11/15  6:05 AM  Result Value Ref Range Status   MRSA by PCR NEGATIVE NEGATIVE Final    Comment:        The GeneXpert MRSA Assay (FDA approved for NASAL specimens only), is one component of a comprehensive MRSA colonization surveillance program. It is not intended to diagnose MRSA infection nor to guide or monitor treatment for MRSA infections.   C difficile quick scan w PCR reflex     Status: Abnormal   Collection Time: 01/15/15 10:37 PM  Result Value Ref Range Status   C Diff antigen POSITIVE (A) NEGATIVE Final   C Diff toxin NEGATIVE NEGATIVE Final   C Diff interpretation   Final    C. difficile present, but toxin not detected. This indicates colonization. In most cases, this does not require treatment. If patient has signs and symptoms consistent with colitis, consider  treatment.     Labs: Basic Metabolic Panel:  Recent Labs Lab 01/13/15 0340 01/14/15 0040 01/15/15 0759 01/16/15 0427 01/17/15 0728  NA 139 139 142 142 140  K 3.0* 3.2* 3.6 3.2* 4.1  CL 102 107 111 109 112*  CO2 26 23 20* 22 20*  GLUCOSE 105* 111* 98 111* 98  BUN 8 12 6 9 11   CREATININE 2.21* 2.04* 1.59* 1.60* 1.61*  CALCIUM 8.4* 8.2* 9.2 9.1 9.0  MG  --  1.5*  --  1.6* 2.3   Liver Function Tests:  Recent Labs Lab 01/10/15 2157  AST 29  ALT 9*  ALKPHOS 168*  BILITOT 0.6  PROT 9.1*  ALBUMIN 4.4    Recent Labs Lab 01/10/15 2157  LIPASE 17*   CBC:  Recent Labs Lab 01/10/15 2157 01/13/15 0340  WBC 28.1* 8.7  HGB 12.8 11.3*  HCT 37.3 35.6*  MCV 90.5 94.4  PLT 589* 337   Cardiac Enzymes:  Recent Labs Lab 01/10/15 2157  TROPONINI <0.03    Signed:  Barton Dubois  [facility] 01/17/2015, 11:19 AM

## 2015-01-17 NOTE — Progress Notes (Signed)
Patient given discharge, medication and f/u instructions, verbalized understanding, prescriptions given, IV and telemetry removed, family to transport home

## 2015-01-19 ENCOUNTER — Emergency Department (HOSPITAL_COMMUNITY)
Admission: EM | Admit: 2015-01-19 | Discharge: 2015-01-19 | Disposition: A | Discharge status: Home / Self Care | Payer: Medicaid Other | Attending: Emergency Medicine | Admitting: Emergency Medicine

## 2015-01-19 ENCOUNTER — Encounter (HOSPITAL_COMMUNITY): Payer: Self-pay | Admitting: Emergency Medicine

## 2015-01-19 DIAGNOSIS — F329 Major depressive disorder, single episode, unspecified: Secondary | ICD-10-CM | POA: Diagnosis not present

## 2015-01-19 DIAGNOSIS — G8929 Other chronic pain: Secondary | ICD-10-CM | POA: Diagnosis not present

## 2015-01-19 DIAGNOSIS — Z85828 Personal history of other malignant neoplasm of skin: Secondary | ICD-10-CM | POA: Insufficient documentation

## 2015-01-19 DIAGNOSIS — I10 Essential (primary) hypertension: Secondary | ICD-10-CM | POA: Diagnosis not present

## 2015-01-19 DIAGNOSIS — F419 Anxiety disorder, unspecified: Secondary | ICD-10-CM | POA: Insufficient documentation

## 2015-01-19 DIAGNOSIS — Z8739 Personal history of other diseases of the musculoskeletal system and connective tissue: Secondary | ICD-10-CM | POA: Diagnosis not present

## 2015-01-19 DIAGNOSIS — R109 Unspecified abdominal pain: Secondary | ICD-10-CM | POA: Diagnosis not present

## 2015-01-19 DIAGNOSIS — Z79899 Other long term (current) drug therapy: Secondary | ICD-10-CM | POA: Diagnosis not present

## 2015-01-19 DIAGNOSIS — R112 Nausea with vomiting, unspecified: Secondary | ICD-10-CM

## 2015-01-19 LAB — I-STAT BETA HCG BLOOD, ED (MC, WL, AP ONLY)

## 2015-01-19 LAB — COMPREHENSIVE METABOLIC PANEL
ALK PHOS: 106 U/L (ref 38–126)
ALT: 28 U/L (ref 14–54)
ANION GAP: 9 (ref 5–15)
AST: 23 U/L (ref 15–41)
Albumin: 4.2 g/dL (ref 3.5–5.0)
BUN: 17 mg/dL (ref 6–20)
CALCIUM: 9.4 mg/dL (ref 8.9–10.3)
CO2: 21 mmol/L — ABNORMAL LOW (ref 22–32)
Chloride: 106 mmol/L (ref 101–111)
Creatinine, Ser: 1.63 mg/dL — ABNORMAL HIGH (ref 0.44–1.00)
GFR calc non Af Amer: 38 mL/min — ABNORMAL LOW (ref >60)
GFR, EST AFRICAN AMERICAN: 44 mL/min — AB (ref >60)
Glucose, Bld: 95 mg/dL (ref 65–99)
Potassium: 3.7 mmol/L (ref 3.5–5.1)
SODIUM: 136 mmol/L (ref 135–145)
Total Bilirubin: 0.5 mg/dL (ref 0.3–1.2)
Total Protein: 8.5 g/dL — ABNORMAL HIGH (ref 6.5–8.1)

## 2015-01-19 LAB — CBC
HCT: 35.5 % — ABNORMAL LOW (ref 36.0–46.0)
HEMOGLOBIN: 11.8 g/dL — AB (ref 12.0–15.0)
MCH: 30.8 pg (ref 26.0–34.0)
MCHC: 33.2 g/dL (ref 30.0–36.0)
MCV: 92.7 fL (ref 78.0–100.0)
PLATELETS: 294 10*3/uL (ref 150–400)
RBC: 3.83 MIL/uL — AB (ref 3.87–5.11)
RDW: 13.5 % (ref 11.5–15.5)
WBC: 11.2 10*3/uL — AB (ref 4.0–10.5)

## 2015-01-19 LAB — URINALYSIS, ROUTINE W REFLEX MICROSCOPIC
Bilirubin Urine: NEGATIVE
Glucose, UA: NEGATIVE mg/dL
KETONES UR: NEGATIVE mg/dL
NITRITE: NEGATIVE
PROTEIN: NEGATIVE mg/dL
Specific Gravity, Urine: 1.006 (ref 1.005–1.030)
UROBILINOGEN UA: 0.2 mg/dL (ref 0.0–1.0)
pH: 5.5 (ref 5.0–8.0)

## 2015-01-19 LAB — I-STAT CG4 LACTIC ACID, ED: LACTIC ACID, VENOUS: 1.18 mmol/L (ref 0.5–2.0)

## 2015-01-19 LAB — URINE MICROSCOPIC-ADD ON

## 2015-01-19 LAB — LIPASE, BLOOD: LIPASE: 20 U/L — AB (ref 22–51)

## 2015-01-19 MED ORDER — PROMETHAZINE HCL 25 MG/ML IJ SOLN
12.5000 mg | Freq: Once | INTRAMUSCULAR | Status: AC
  Administered 2015-01-19: 12.5 mg via INTRAVENOUS
  Filled 2015-01-19: qty 1

## 2015-01-19 MED ORDER — SODIUM CHLORIDE 0.9 % IV BOLUS (SEPSIS)
2000.0000 mL | Freq: Once | INTRAVENOUS | Status: AC
  Administered 2015-01-19: 2000 mL via INTRAVENOUS

## 2015-01-19 MED ORDER — HYOSCYAMINE SULFATE 0.5 MG/ML IJ SOLN
0.1250 mg | Freq: Once | INTRAMUSCULAR | Status: AC
  Administered 2015-01-19: 0.125 mg via INTRAVENOUS
  Filled 2015-01-19: qty 0.25

## 2015-01-19 MED ORDER — ONDANSETRON HCL 4 MG/2ML IJ SOLN
4.0000 mg | Freq: Once | INTRAMUSCULAR | Status: AC | PRN
  Administered 2015-01-19: 4 mg via INTRAVENOUS
  Filled 2015-01-19: qty 2

## 2015-01-19 MED ORDER — PROMETHAZINE HCL 25 MG RE SUPP: 25.0000 mg | Freq: Four times a day (QID) | RECTAL | Status: DC | PRN

## 2015-01-19 MED ORDER — PROMETHAZINE HCL 25 MG RE SUPP
25.0000 mg | Freq: Four times a day (QID) | RECTAL | Status: DC | PRN
  Administered 2015-01-19: 25 mg via RECTAL
  Filled 2015-01-19: qty 1

## 2015-01-19 MED ORDER — ONDANSETRON HCL 4 MG/2ML IJ SOLN
4.0000 mg | Freq: Once | INTRAMUSCULAR | Status: AC
  Administered 2015-01-19: 4 mg via INTRAVENOUS
  Filled 2015-01-19: qty 2

## 2015-01-19 MED ORDER — HYDROMORPHONE HCL 1 MG/ML IJ SOLN
1.0000 mg | Freq: Once | INTRAMUSCULAR | Status: AC
  Administered 2015-01-19: 1 mg via INTRAVENOUS
  Filled 2015-01-19: qty 1

## 2015-01-19 NOTE — ED Provider Notes (Signed)
CSN: [id number]     Arrival date & time 01/19/15  0022 History   First MD Initiated Contact with Patient 01/19/15 0055     Chief Complaint  Patient presents with  . Emesis     (Consider location/radiation/quality/duration/timing/severity/associated sxs/prior Treatment) HPI Comments: 44 year old with history of PTSD and depression, hypertension, hyperlipidemia with recurrent admissions for nausea/vomiting and abdominal pain. She's had multiple admissions this year in May July, August and September, last discharged 2 days ago, after she presents with nausea vomiting diarrhea and abdominal pain of unclear etiology.  No source of her abdominal pain has been identified in the past. She's had multiple CTs of the abdomen which were negative. CT of the chest which was negative for pulmonary embolism. Currently, she reports onset of vomiting last night. She reports also that her abdominal pain was still present when discharged from last hospitalization. No blood in emesis or c/o melena or bloody stools. No fever.   Patient is a 44 y.o. female presenting with vomiting. The history is provided by the patient. No language interpreter was used.  Emesis Severity:  Severe Duration:  1 day Associated symptoms: abdominal pain   Associated symptoms: no chills and no diarrhea     Past Medical History  Diagnosis Date  . PTSD (post-traumatic stress disorder)   . Bulging lumbar disc   . Depression   . Hypertension   . Hyperlipemia   . Headache     "weekly" (10/15/2014)  . DDD (degenerative disc disease), cervical   . Arthritis     "joints ache all over" (10/15/2014)  . Chronic lower back pain   . Anxiety   . Skin cancer     "had them cut off my arms; don't know what kind"   Past Surgical History  Procedure Laterality Date  . Ablation on endometriosis  2008  . Hemorrhoid surgery  ~ 2002   History reviewed. No pertinent family history. Social History  Substance Use Topics  . Smoking status: Never  Smoker   . Smokeless tobacco: Never Used  . Alcohol Use: Yes     Comment: 10/15/2014 "I've drank before; nothing regular; don't drink now cause of RX I'm on"   OB History    No data available     Review of Systems  Constitutional: Negative for fever and chills.  HENT: Negative.   Respiratory: Negative.   Cardiovascular: Negative.   Gastrointestinal: Positive for nausea, vomiting and abdominal pain. Negative for diarrhea.  Genitourinary: Negative.   Musculoskeletal: Negative.   Skin: Negative.   Neurological: Negative.       Allergies  Lactose intolerance (gi)  Home Medications   Prior to Admission medications   Medication Sig Start Date End Date Taking? Authorizing Natilee Gauer  amLODipine (NORVASC) 10 MG tablet Take 1 tablet (10 mg total) by mouth daily. 10/17/14  Yes Kelvin Cellar, MD  dicyclomine (BENTYL) 20 MG tablet Take 1 tablet (20 mg total) by mouth 3 (three) times daily before meals. 01/17/15  Yes Barton Dubois, MD  DULoxetine (CYMBALTA) 30 MG capsule Take 1 capsule (30 mg total) by mouth daily. 12/20/14  Yes Eugenie Filler, MD  HYDROcodone-acetaminophen (NORCO) 10-325 MG per tablet Take 1 tablet by mouth every 8 (eight) hours as needed for moderate pain. 01/17/15  Yes Barton Dubois, MD  ondansetron (ZOFRAN ODT) 4 MG disintegrating tablet Take 1 tablet (4 mg total) by mouth every 8 (eight) hours as needed for nausea or vomiting. 01/03/15  Yes Oswald Hillock, MD  saccharomyces  boulardii (FLORASTOR) 250 MG capsule Take 1 capsule (250 mg total) by mouth 2 (two) times daily. 01/17/15  Yes Barton Dubois, MD  Sertraline HCl (ZOLOFT PO) Take 1 tablet by mouth daily.   Yes Historical Molleigh Huot, MD  busPIRone (BUSPAR) 7.5 MG tablet Take 1 tablet (7.5 mg total) by mouth 3 (three) times daily. 01/17/15   Barton Dubois, MD  diazepam (VALIUM) 2 MG tablet Take 1 tablet (2 mg total) by mouth every 12 (twelve) hours as needed for anxiety. 01/17/15   Barton Dubois, MD  fidaxomicin (DIFICID) 200  MG TABS tablet Take 1 tablet (200 mg total) by mouth 2 (two) times daily. Patient not taking: Reported on 01/19/2015 01/17/15 01/25/15  Barton Dubois, MD   BP 173/98 mmHg  Pulse 122  Temp(Src) 98.1 F (36.7 C) (Oral)  Resp 20  Ht 5\' 5"  (1.651 m)  Wt 166 lb (75.297 kg)  BMI 27.62 kg/m2  SpO2 100% Physical Exam  Constitutional: She is oriented to person, place, and time. She appears well-developed and well-nourished.  HENT:  Head: Normocephalic.  Neck: Normal range of motion. Neck supple.  Cardiovascular: Normal rate and regular rhythm.   Pulmonary/Chest: Effort normal and breath sounds normal.  Abdominal: Soft. Bowel sounds are normal. There is tenderness. There is no rebound and no guarding.  Genitourinary:  No CVA tenderness.   Musculoskeletal: Normal range of motion.  Neurological: She is alert and oriented to person, place, and time.  Skin: Skin is warm and dry. No rash noted.  Psychiatric: She has a normal mood and affect.    ED Course  Procedures (including critical care time) Labs Review Labs Reviewed  CBC - Abnormal; Notable for the following:    WBC 11.2 (*)    RBC 3.83 (*)    Hemoglobin 11.8 (*)    HCT 35.5 (*)    All other components within normal limits  URINALYSIS, ROUTINE W REFLEX MICROSCOPIC (NOT AT [facility] - Abnormal; Notable for the following:    APPearance CLOUDY (*)    Hgb urine dipstick LARGE (*)    Leukocytes, UA TRACE (*)    All other components within normal limits  URINE MICROSCOPIC-ADD ON  LIPASE, BLOOD  COMPREHENSIVE METABOLIC PANEL  I-STAT BETA HCG BLOOD, ED ([facility], [facility], [facility] ONLY)  I-STAT CG4 LACTIC ACID, ED   Results for orders placed or performed during the hospital encounter of 01/19/15  Lipase, blood  Result Value Ref Range   Lipase 20 (L) 22 - 51 U/L  Comprehensive metabolic panel  Result Value Ref Range   Sodium 136 135 - 145 mmol/L   Potassium 3.7 3.5 - 5.1 mmol/L   Chloride 106 101 - 111 mmol/L   CO2 21 (L) 22 - 32 mmol/L   Glucose,  Bld 95 65 - 99 mg/dL   BUN 17 6 - 20 mg/dL   Creatinine, Ser 1.63 (H) 0.44 - 1.00 mg/dL   Calcium 9.4 8.9 - 10.3 mg/dL   Total Protein 8.5 (H) 6.5 - 8.1 g/dL   Albumin 4.2 3.5 - 5.0 g/dL   AST 23 15 - 41 U/L   ALT 28 14 - 54 U/L   Alkaline Phosphatase 106 38 - 126 U/L   Total Bilirubin 0.5 0.3 - 1.2 mg/dL   GFR calc non Af Amer 38 (L) >60 mL/min   GFR calc Af Amer 44 (L) >60 mL/min   Anion gap 9 5 - 15  CBC  Result Value Ref Range   WBC 11.2 (H) 4.0 -  10.5 K/uL   RBC 3.83 (L) 3.87 - 5.11 MIL/uL   Hemoglobin 11.8 (L) 12.0 - 15.0 g/dL   HCT 35.5 (L) 36.0 - 46.0 %   MCV 92.7 78.0 - 100.0 fL   MCH 30.8 26.0 - 34.0 pg   MCHC 33.2 30.0 - 36.0 g/dL   RDW 13.5 11.5 - 15.5 %   Platelets 294 150 - 400 K/uL  Urinalysis, Routine w reflex microscopic (not at [facility])  Result Value Ref Range   Color, Urine YELLOW YELLOW   APPearance CLOUDY (A) CLEAR   Specific Gravity, Urine 1.006 1.005 - 1.030   pH 5.5 5.0 - 8.0   Glucose, UA NEGATIVE NEGATIVE mg/dL   Hgb urine dipstick LARGE (A) NEGATIVE   Bilirubin Urine NEGATIVE NEGATIVE   Ketones, ur NEGATIVE NEGATIVE mg/dL   Protein, ur NEGATIVE NEGATIVE mg/dL   Urobilinogen, UA 0.2 0.0 - 1.0 mg/dL   Nitrite NEGATIVE NEGATIVE   Leukocytes, UA TRACE (A) NEGATIVE  Urine microscopic-add on  Result Value Ref Range   Squamous Epithelial / LPF RARE RARE   WBC, UA 3-6 <3 WBC/hpf   RBC / HPF 3-6 <3 RBC/hpf  I-Stat beta hCG blood, ED ([facility], [facility], [facility] only)  Result Value Ref Range   I-stat hCG, quantitative <5.0 <5 mIU/mL   Comment 3          I-Stat CG4 Lactic Acid, ED  Result Value Ref Range   Lactic Acid, Venous 1.18 0.5 - 2.0 mmol/L    Imaging Review No results found. I have personally reviewed and evaluated these images and lab results as part of my medical decision-making.   EKG Interpretation None      MDM   Final diagnoses:  None    1. Nausea and vomiting 2. Abdominal pain  The patient has stable VS. Tachycardia improves with IV  fluids. Chart reviewed. Her several admissions have included lactic acidosis, leukocytosis along with the abdominal pain and vomiting. Tonight she has no leukocytosis or lactic acidosis. No vomiting in ED. Her emesis bag has no emesis, only saliva from spitting.  Re-evaluation: she is still having nausea with abdominal pain. She states symptoms are persistent from time of discharge from most recent admission 2 days ago. VSS. IV fluids running. Plan is to discharge home with GI referral for further outpatient evaluation of recurrent/chronic symptoms. The patient is reassured there is no condition that would warrant further admission today and outpatient follow up would be essential for definitive treatment of her chronic/recurrent abdominal pain/vomiting.    Charlann Lange, PA-C 01/19/15 [place], PA-C 01/19/15 [id number]  Julianne Rice, MD 01/19/15 [phone]

## 2015-01-19 NOTE — Discharge Instructions (Signed)
CONTINUE YOUR CURRENT MEDICATIONS FOR PAIN, USE PHENERGAN SUPPOSITORIES FOR NAUSEA. IT IS IMPORTANT TO FOLLOW UP WITH GASTROENTEROLOGY FOR FURTHER EVALUATION TO DETERMINE THE CAUSE OF YOUR PERSISTENT/RECURRENT ABDOMINAL PAIN AND NAUSEA.   Abdominal Pain Many things can cause abdominal pain. Usually, abdominal pain is not caused by a disease and will improve without treatment. It can often be observed and treated at home. Your health care provider will do a physical exam and possibly order blood tests and X-rays to help determine the seriousness of your pain. However, in many cases, more time must pass before a clear cause of the pain can be found. Before that point, your health care provider may not know if you need more testing or further treatment. HOME CARE INSTRUCTIONS  Monitor your abdominal pain for any changes. The following actions may help to alleviate any discomfort you are experiencing:  Only take over-the-counter or prescription medicines as directed by your health care provider.  Do not take laxatives unless directed to do so by your health care provider.  Try a clear liquid diet (broth, tea, or water) as directed by your health care provider. Slowly move to a bland diet as tolerated. SEEK MEDICAL CARE IF:  You have unexplained abdominal pain.  You have abdominal pain associated with nausea or diarrhea.  You have pain when you urinate or have a bowel movement.  You experience abdominal pain that wakes you in the night.  You have abdominal pain that is worsened or improved by eating food.  You have abdominal pain that is worsened with eating fatty foods.  You have a fever. SEEK IMMEDIATE MEDICAL CARE IF:   Your pain does not go away within 2 hours.  You keep throwing up (vomiting).  Your pain is felt only in portions of the abdomen, such as the right side or the left lower portion of the abdomen.  You pass bloody or black tarry stools. MAKE SURE YOU:  Understand  these instructions.   Will watch your condition.   Will get help right away if you are not doing well or get worse.  Document Released: 01/17/2005 Document Revised: 04/14/2013 Document Reviewed: 12/17/2012 [facility] Patient Information 2015 [place], [place]. This information is not intended to replace advice given to you by your health care provider. Make sure you discuss any questions you have with your health care provider.

## 2015-01-19 NOTE — ED Notes (Signed)
Pt demanding a prescription for percocet or vicodin. States her chronic pain medicine is out. She does have a f/u appointment today with her chronic pain doctor Arelia Sneddon ?sp) at 11am. States she will leave here and go straight there. She was encouraged to keep that appointment and aware of the EDs policy on prescribing chronic pain medicine.   IV cath dc intact. Verbalized understanding of dc instructions, prescriptions (phenergan) and follow up care. Husband aware she is ready for discharge (0700) stated he will be here in 30 minutes to pick her up.   Escorted to [place] via wheelchair.

## 2015-01-19 NOTE — ED Notes (Signed)
Patient c/o emesis and abd pain. Last BM 01/18/2015. Patient c/o low abd pain 8/10 "jabbing". Patient recently d/c with Cdiff infection. Patient was taking Zofran at home for nausea with relief, last dose at 1700, has run out. Patient states she has 3-4 episodes of emesis in past 24 hours.

## 2015-01-19 NOTE — ED Notes (Signed)
PA at bedside.

## 2015-02-02 ENCOUNTER — Other Ambulatory Visit: Payer: Self-pay

## 2015-03-13 ENCOUNTER — Encounter (HOSPITAL_COMMUNITY): Payer: Self-pay

## 2015-03-13 ENCOUNTER — Emergency Department (HOSPITAL_COMMUNITY)
Admission: EM | Admit: 2015-03-13 | Discharge: 2015-03-13 | Disposition: A | Discharge status: Home / Self Care | Payer: Medicaid Other | Attending: Emergency Medicine | Admitting: Emergency Medicine

## 2015-03-13 DIAGNOSIS — M1990 Unspecified osteoarthritis, unspecified site: Secondary | ICD-10-CM | POA: Insufficient documentation

## 2015-03-13 DIAGNOSIS — Z85828 Personal history of other malignant neoplasm of skin: Secondary | ICD-10-CM | POA: Insufficient documentation

## 2015-03-13 DIAGNOSIS — R34 Anuria and oliguria: Secondary | ICD-10-CM | POA: Insufficient documentation

## 2015-03-13 DIAGNOSIS — I10 Essential (primary) hypertension: Secondary | ICD-10-CM | POA: Insufficient documentation

## 2015-03-13 DIAGNOSIS — F419 Anxiety disorder, unspecified: Secondary | ICD-10-CM | POA: Insufficient documentation

## 2015-03-13 DIAGNOSIS — F329 Major depressive disorder, single episode, unspecified: Secondary | ICD-10-CM | POA: Diagnosis not present

## 2015-03-13 DIAGNOSIS — Z8639 Personal history of other endocrine, nutritional and metabolic disease: Secondary | ICD-10-CM | POA: Diagnosis not present

## 2015-03-13 DIAGNOSIS — Z79899 Other long term (current) drug therapy: Secondary | ICD-10-CM | POA: Diagnosis not present

## 2015-03-13 DIAGNOSIS — R197 Diarrhea, unspecified: Secondary | ICD-10-CM | POA: Diagnosis not present

## 2015-03-13 DIAGNOSIS — R112 Nausea with vomiting, unspecified: Secondary | ICD-10-CM | POA: Diagnosis not present

## 2015-03-13 DIAGNOSIS — F4310 Post-traumatic stress disorder, unspecified: Secondary | ICD-10-CM | POA: Diagnosis not present

## 2015-03-13 DIAGNOSIS — R300 Dysuria: Secondary | ICD-10-CM | POA: Insufficient documentation

## 2015-03-13 DIAGNOSIS — R1084 Generalized abdominal pain: Secondary | ICD-10-CM | POA: Insufficient documentation

## 2015-03-13 DIAGNOSIS — G8929 Other chronic pain: Secondary | ICD-10-CM | POA: Insufficient documentation

## 2015-03-13 DIAGNOSIS — R109 Unspecified abdominal pain: Secondary | ICD-10-CM

## 2015-03-13 LAB — CBC WITH DIFFERENTIAL/PLATELET
BASOS ABS: 0 10*3/uL (ref 0.0–0.1)
BASOS PCT: 0 %
EOS ABS: 0.2 10*3/uL (ref 0.0–0.7)
EOS PCT: 2 %
HEMATOCRIT: 34.1 % — AB (ref 36.0–46.0)
Hemoglobin: 10.8 g/dL — ABNORMAL LOW (ref 12.0–15.0)
Lymphocytes Relative: 9 %
Lymphs Abs: 1 10*3/uL (ref 0.7–4.0)
MCH: 29.6 pg (ref 26.0–34.0)
MCHC: 31.7 g/dL (ref 30.0–36.0)
MCV: 93.4 fL (ref 78.0–100.0)
MONO ABS: 0.5 10*3/uL (ref 0.1–1.0)
Monocytes Relative: 5 %
NEUTROS ABS: 9.2 10*3/uL — AB (ref 1.7–7.7)
Neutrophils Relative %: 84 %
PLATELETS: 259 10*3/uL (ref 150–400)
RBC: 3.65 MIL/uL — ABNORMAL LOW (ref 3.87–5.11)
RDW: 13.7 % (ref 11.5–15.5)
WBC: 10.9 10*3/uL — ABNORMAL HIGH (ref 4.0–10.5)

## 2015-03-13 LAB — URINALYSIS, ROUTINE W REFLEX MICROSCOPIC
Bilirubin Urine: NEGATIVE
GLUCOSE, UA: NEGATIVE mg/dL
Hgb urine dipstick: NEGATIVE
KETONES UR: NEGATIVE mg/dL
LEUKOCYTES UA: NEGATIVE
NITRITE: NEGATIVE
PH: 6 (ref 5.0–8.0)
PROTEIN: NEGATIVE mg/dL
Specific Gravity, Urine: 1.009 (ref 1.005–1.030)

## 2015-03-13 LAB — COMPREHENSIVE METABOLIC PANEL
ALBUMIN: 3.9 g/dL (ref 3.5–5.0)
ALT: 10 U/L — ABNORMAL LOW (ref 14–54)
ANION GAP: 10 (ref 5–15)
AST: 13 U/L — AB (ref 15–41)
Alkaline Phosphatase: 78 U/L (ref 38–126)
BUN: 10 mg/dL (ref 6–20)
CHLORIDE: 107 mmol/L (ref 101–111)
CO2: 23 mmol/L (ref 22–32)
Calcium: 9.1 mg/dL (ref 8.9–10.3)
Creatinine, Ser: 0.68 mg/dL (ref 0.44–1.00)
GFR calc Af Amer: >60 mL/min (ref >60)
GFR calc non Af Amer: >60 mL/min (ref >60)
GLUCOSE: 94 mg/dL (ref 65–99)
POTASSIUM: 4.7 mmol/L (ref 3.5–5.1)
SODIUM: 140 mmol/L (ref 135–145)
Total Bilirubin: 0.3 mg/dL (ref 0.3–1.2)
Total Protein: 7.2 g/dL (ref 6.5–8.1)

## 2015-03-13 LAB — LIPASE, BLOOD: Lipase: 18 U/L (ref 11–51)

## 2015-03-13 MED ORDER — GI COCKTAIL ~~LOC~~
30.0000 mL | Freq: Once | ORAL | Status: AC
  Administered 2015-03-13: 30 mL via ORAL
  Filled 2015-03-13: qty 30

## 2015-03-13 MED ORDER — OMEPRAZOLE 20 MG PO CPDR: 20.0000 mg | DELAYED_RELEASE_CAPSULE | Freq: Every day | ORAL | Status: DC

## 2015-03-13 MED ORDER — DICYCLOMINE HCL 10 MG PO CAPS
10.0000 mg | ORAL_CAPSULE | Freq: Once | ORAL | Status: AC
  Administered 2015-03-13: 10 mg via ORAL
  Filled 2015-03-13: qty 1

## 2015-03-13 MED ORDER — ONDANSETRON HCL 4 MG/2ML IJ SOLN
4.0000 mg | INTRAMUSCULAR | Status: AC
  Administered 2015-03-13: 4 mg via INTRAVENOUS
  Filled 2015-03-13: qty 2

## 2015-03-13 MED ORDER — PROMETHAZINE HCL 25 MG/ML IJ SOLN
25.0000 mg | Freq: Once | INTRAMUSCULAR | Status: AC
  Administered 2015-03-13: 25 mg via INTRAVENOUS
  Filled 2015-03-13: qty 1

## 2015-03-13 MED ORDER — SUCRALFATE 1 G PO TABS: 1.0000 g | ORAL_TABLET | Freq: Three times a day (TID) | ORAL | Status: DC

## 2015-03-13 MED ORDER — SODIUM CHLORIDE 0.9 % IV BOLUS (SEPSIS)
1000.0000 mL | Freq: Once | INTRAVENOUS | Status: AC
  Administered 2015-03-13: 1000 mL via INTRAVENOUS

## 2015-03-13 NOTE — ED Notes (Signed)
Pt aware of urine sample 

## 2015-03-13 NOTE — Discharge Instructions (Signed)
1. Medications: prilosec, carafate, usual home medications 2. Treatment: rest, drink plenty of fluids 3. Follow Up: please followup with your primary doctor this week for discussion of your diagnoses and further evaluation after today's visit; please return to the ER for high fever, severe pain, persistent vomiting, new or worsening symptoms   Abdominal Pain, Adult Many things can cause abdominal pain. Usually, abdominal pain is not caused by a disease and will improve without treatment. It can often be observed and treated at home. Your health care provider will do a physical exam and possibly order blood tests and X-rays to help determine the seriousness of your pain. However, in many cases, more time must pass before a clear cause of the pain can be found. Before that point, your health care provider may not know if you need more testing or further treatment. HOME CARE INSTRUCTIONS Monitor your abdominal pain for any changes. The following actions may help to alleviate any discomfort you are experiencing:  Only take over-the-counter or prescription medicines as directed by your health care provider.  Do not take laxatives unless directed to do so by your health care provider.  Try a clear liquid diet (broth, tea, or water) as directed by your health care provider. Slowly move to a bland diet as tolerated. SEEK MEDICAL CARE IF:  You have unexplained abdominal pain.  You have abdominal pain associated with nausea or diarrhea.  You have pain when you urinate or have a bowel movement.  You experience abdominal pain that wakes you in the night.  You have abdominal pain that is worsened or improved by eating food.  You have abdominal pain that is worsened with eating fatty foods.  You have a fever. SEEK IMMEDIATE MEDICAL CARE IF:  Your pain does not go away within 2 hours.  You keep throwing up (vomiting).  Your pain is felt only in portions of the abdomen, such as the right side or  the left lower portion of the abdomen.  You pass bloody or black tarry stools. MAKE SURE YOU:  Understand these instructions.  Will watch your condition.  Will get help right away if you are not doing well or get worse.   This information is not intended to replace advice given to you by your health care provider. Make sure you discuss any questions you have with your health care provider.   Document Released: 01/17/2005 Document Revised: 12/29/2014 Document Reviewed: 12/17/2012 Elsevier Interactive Patient Education [employer].

## 2015-03-13 NOTE — ED Provider Notes (Signed)
CSN: [id number]     Arrival date & time 03/13/15  [id number] History   First MD Initiated Contact with Patient 03/13/15 [phone]     Chief Complaint  Patient presents with  . Abdominal Pain    HPI   [name] is a 44 y.o. female with a PMH of HTN, HLD, depression, anxiety, chronic low back pain who presents to the ED with diffuse abdominal pain, N/V, and diarrhea, which she states has been ongoing since June. She reports eating solid foods exacerbates her symptoms. She has tried her home pain mediation with mild symptom relief. She denies fever, chills, chest pain, shortness of breath, hematemesis, melena, hematochezia. She reports dysuria and decreased urine. She denies vaginal discharge. She states she feels anxious about her symptoms. She denies SI or HI.   Past Medical History  Diagnosis Date  . PTSD (post-traumatic stress disorder)   . Bulging lumbar disc   . Depression   . Hypertension   . Hyperlipemia   . Headache     "weekly" (10/15/2014)  . DDD (degenerative disc disease), cervical   . Arthritis     "joints ache all over" (10/15/2014)  . Chronic lower back pain   . Anxiety   . Skin cancer     "had them cut off my arms; don't know what kind"   Past Surgical History  Procedure Laterality Date  . Ablation on endometriosis  2008  . Hemorrhoid surgery  ~ 2002   No family history on file. Social History  Substance Use Topics  . Smoking status: Never Smoker   . Smokeless tobacco: Never Used  . Alcohol Use: Yes     Comment: 10/15/2014 "I've drank before; nothing regular; don't drink now cause of RX I'm on"   OB History    No data available      Review of Systems  Constitutional: Negative for fever and chills.  Respiratory: Negative for shortness of breath.   Cardiovascular: Negative for chest pain.  Gastrointestinal: Positive for nausea, vomiting, abdominal pain and diarrhea. Negative for constipation and blood in stool.  Genitourinary: Positive for dysuria and  decreased urine volume. Negative for urgency, frequency and vaginal discharge.  All other systems reviewed and are negative.     Allergies  Lactose intolerance (gi)  Home Medications   Prior to Admission medications   Medication Sig Start Date End Date Taking? Authorizing Provider  diazepam (VALIUM) 2 MG tablet Take 1 tablet (2 mg total) by mouth every 12 (twelve) hours as needed for anxiety. 01/17/15  Yes Barton Dubois, MD  HYDROcodone-acetaminophen [facility]) 10-325 MG per tablet Take 1 tablet by mouth every 8 (eight) hours as needed for moderate pain. 01/17/15  Yes Barton Dubois, MD  hydrOXYzine (VISTARIL) 50 MG capsule Take 1 capsule by mouth 2 (two) times daily as needed. anxiety 03/08/15  Yes Historical Provider, MD  lisinopril (PRINIVIL,ZESTRIL) 20 MG tablet Take 1 tablet by mouth daily.   Yes Historical Provider, MD  ondansetron (ZOFRAN ODT) 4 MG disintegrating tablet Take 1 tablet (4 mg total) by mouth every 8 (eight) hours as needed for nausea or vomiting. 01/03/15  Yes Oswald Hillock, MD  PARoxetine (PAXIL) 20 MG tablet Take 1 tablet by mouth daily. 03/08/15  Yes Historical Provider, MD  promethazine (PHENERGAN) 25 MG suppository Place 1 suppository (25 mg total) rectally every 6 (six) hours as needed for nausea or vomiting. 01/19/15  Yes Charlann Lange, PA-C  saccharomyces boulardii (FLORASTOR) 250 MG capsule Take 1 capsule (250  mg total) by mouth 2 (two) times daily. 01/17/15  Yes Barton Dubois, MD  zolpidem (AMBIEN) 10 MG tablet Take 1 tablet by mouth at bedtime as needed. sleep 03/08/15  Yes Historical Provider, MD  amLODipine (NORVASC) 10 MG tablet Take 1 tablet (10 mg total) by mouth daily. Patient not taking: Reported on 03/13/2015 10/17/14   Kelvin Cellar, MD  busPIRone (BUSPAR) 7.5 MG tablet Take 1 tablet (7.5 mg total) by mouth 3 (three) times daily. Patient not taking: Reported on 03/13/2015 01/17/15   Barton Dubois, MD  dicyclomine (BENTYL) 20 MG tablet Take 1 tablet (20 mg  total) by mouth 3 (three) times daily before meals. Patient not taking: Reported on 03/13/2015 01/17/15   Barton Dubois, MD  DULoxetine (CYMBALTA) 30 MG capsule Take 1 capsule (30 mg total) by mouth daily. Patient not taking: Reported on 03/13/2015 12/20/14   Eugenie Filler, MD  omeprazole (PRILOSEC) 20 MG capsule Take 1 capsule (20 mg total) by mouth daily. 03/13/15   Marella Chimes, PA-C  sucralfate (CARAFATE) 1 G tablet Take 1 tablet (1 g total) by mouth 4 (four) times daily -  with meals and at bedtime. 03/13/15   Marella Chimes, PA-C    BP 129/103 mmHg  Pulse 100  Temp(Src) 98.2 F (36.8 C) (Oral)  Resp 13  SpO2 100%  LMP 02/13/2015 (Approximate) Physical Exam  Constitutional: She is oriented to person, place, and time. She appears well-developed and well-nourished. No distress.  HENT:  Head: Normocephalic and atraumatic.  Right Ear: External ear normal.  Left Ear: External ear normal.  Nose: Nose normal.  Mouth/Throat: Uvula is midline, oropharynx is clear and moist and mucous membranes are normal.  Eyes: Conjunctivae, EOM and lids are normal. Pupils are equal, round, and reactive to light. Right eye exhibits no discharge. Left eye exhibits no discharge. No scleral icterus.  Neck: Normal range of motion. Neck supple.  Cardiovascular: Normal rate, regular rhythm, normal heart sounds, intact distal pulses and normal pulses.   Pulmonary/Chest: Effort normal and breath sounds normal. No respiratory distress. She has no wheezes. She has no rales.  Abdominal: Soft. Normal appearance and bowel sounds are normal. She exhibits no distension and no mass. There is tenderness. There is no rigidity, no rebound and no guarding.  Diffuse TTP, worse in epigastrium. No rebound, guarding, or masses.  Musculoskeletal: Normal range of motion. She exhibits no edema or tenderness.  Neurological: She is alert and oriented to person, place, and time. She has normal strength. No cranial nerve  deficit or sensory deficit.  Skin: Skin is warm, dry and intact. No rash noted. She is not diaphoretic. No erythema. No pallor.  Psychiatric: She has a normal mood and affect. Her speech is normal and behavior is normal. Judgment and thought content normal.  Nursing note and vitals reviewed.   ED Course  Procedures (including critical care time)  Labs Review Labs Reviewed  CBC WITH DIFFERENTIAL/PLATELET - Abnormal; Notable for the following:    WBC 10.9 (*)    RBC 3.65 (*)    Hemoglobin 10.8 (*)    HCT 34.1 (*)    Neutro Abs 9.2 (*)    All other components within normal limits  COMPREHENSIVE METABOLIC PANEL - Abnormal; Notable for the following:    AST 13 (*)    ALT 10 (*)    All other components within normal limits  LIPASE, BLOOD  URINALYSIS, ROUTINE W REFLEX MICROSCOPIC (NOT AT [facility])    Imaging Review No  results found.   I have personally reviewed and evaluated these lab results as part of my medical decision-making.   EKG Interpretation None      MDM   Final diagnoses:  Abdominal pain, unspecified abdominal location    45 year old female presents with diffuse abdominal pain, N/V, and diarrhea, which has been intermittent since June. Denies fever, chills, chest pain, shortness of breath, hematemesis, melena, hematochezia. Reports dysuria and decreased urine. Denies vaginal discharge. On record review, appears the patient has been evaluated for the same symptoms several times. She was discharged from the hospital in September, at which time her symptoms were thought to be due to IBS, and she was instructed to follow-up with GI.   Patient is afebrile. Vital signs stable. Heart regular rate and rhythm. Lungs clear to auscultation bilaterally. Abdomen soft, nondistended, with mild diffuse tenderness to palpation, worse in epigastrium. No rebound, guarding, or masses.   Patient given fluids, zofran, bentyl for symptoms.   CBC remarkable for mild leukocytosis of 10.9,  hemoglobin 10.8, which appears stable. CMP unremarkable. Lipase within normal limits. UA negative for infection.  Last CT 09/10, which was negative for acute intra-abdominal or pelvic pathology. Patient reports persistent pain, however denies change in her symptoms. Do not feel further imaging is indicated at this time. Patient is non-toxic and well-appearing, feel she is stable for discharge. Return precautions discussed. Will give PPI and carafate, as patient reports symptom improvement s/p GI cocktail. Patient verbalizes her understanding and is in agreement with plan.  Patient discussed with and seen by Dr. Lacinda Axon.  BP 129/103 mmHg  Pulse 100  Temp(Src) 98.2 F (36.8 C) (Oral)  Resp 13  SpO2 100%  LMP 02/13/2015 (Approximate)    Marella Chimes, PA-C 03/13/15 1601  Nat Christen, MD 03/15/15 1226

## 2015-03-13 NOTE — ED Notes (Signed)
Unable to obtain labs from IV.  I have attempted phlebotomy without success.  [name] is attempting phlebotomy as I write this.

## 2015-03-13 NOTE — ED Notes (Signed)
Pt verbalized understanding rx and d/c instructions.

## 2015-03-13 NOTE — ED Notes (Signed)
She c/o persistent low abd. Pain; Nehemiah Settle. Notified.

## 2015-03-13 NOTE — ED Notes (Signed)
She c/o low abd. Pain L > R x ~ 4 months.  She also c/o some chronic difficulty with voiding; and cites a recent hospitalization during which she was catheterized.  She states she is here today for abd. Pain and vomiting (10 times/24 hours).  She also states she had had a few episodes of diarrhea a few days ago; last b.m. 2 days ago.  She is in no distress.

## 2015-04-18 ENCOUNTER — Encounter (HOSPITAL_COMMUNITY): Payer: Self-pay | Admitting: *Deleted

## 2015-04-18 ENCOUNTER — Emergency Department (HOSPITAL_COMMUNITY)
Admission: EM | Admit: 2015-04-18 | Discharge: 2015-04-19 | Disposition: A | Discharge status: Home / Self Care | Payer: Medicaid Other | Attending: Emergency Medicine | Admitting: Emergency Medicine

## 2015-04-18 DIAGNOSIS — Z85828 Personal history of other malignant neoplasm of skin: Secondary | ICD-10-CM | POA: Insufficient documentation

## 2015-04-18 DIAGNOSIS — F4310 Post-traumatic stress disorder, unspecified: Secondary | ICD-10-CM | POA: Insufficient documentation

## 2015-04-18 DIAGNOSIS — I10 Essential (primary) hypertension: Secondary | ICD-10-CM | POA: Insufficient documentation

## 2015-04-18 DIAGNOSIS — M1990 Unspecified osteoarthritis, unspecified site: Secondary | ICD-10-CM | POA: Insufficient documentation

## 2015-04-18 DIAGNOSIS — Z3202 Encounter for pregnancy test, result negative: Secondary | ICD-10-CM | POA: Insufficient documentation

## 2015-04-18 DIAGNOSIS — M5080 Other cervical disc disorders, unspecified cervical region: Secondary | ICD-10-CM | POA: Insufficient documentation

## 2015-04-18 DIAGNOSIS — F419 Anxiety disorder, unspecified: Secondary | ICD-10-CM | POA: Insufficient documentation

## 2015-04-18 DIAGNOSIS — Z8639 Personal history of other endocrine, nutritional and metabolic disease: Secondary | ICD-10-CM | POA: Insufficient documentation

## 2015-04-18 DIAGNOSIS — G8929 Other chronic pain: Secondary | ICD-10-CM | POA: Insufficient documentation

## 2015-04-18 DIAGNOSIS — F329 Major depressive disorder, single episode, unspecified: Secondary | ICD-10-CM | POA: Insufficient documentation

## 2015-04-18 DIAGNOSIS — R109 Unspecified abdominal pain: Secondary | ICD-10-CM | POA: Insufficient documentation

## 2015-04-18 DIAGNOSIS — Z79899 Other long term (current) drug therapy: Secondary | ICD-10-CM | POA: Insufficient documentation

## 2015-04-18 HISTORY — DX: Malingerer (conscious simulation): Z76.5

## 2015-04-18 LAB — COMPREHENSIVE METABOLIC PANEL
ALBUMIN: 4.4 g/dL (ref 3.5–5.0)
ALT: 14 U/L (ref 14–54)
ANION GAP: 16 — AB (ref 5–15)
AST: 27 U/L (ref 15–41)
Alkaline Phosphatase: 94 U/L (ref 38–126)
BILIRUBIN TOTAL: 0.4 mg/dL (ref 0.3–1.2)
BUN: 11 mg/dL (ref 6–20)
CHLORIDE: 108 mmol/L (ref 101–111)
CO2: 20 mmol/L — AB (ref 22–32)
Calcium: 9.7 mg/dL (ref 8.9–10.3)
Creatinine, Ser: 1.04 mg/dL — ABNORMAL HIGH (ref 0.44–1.00)
GFR calc Af Amer: >60 mL/min (ref >60)
GFR calc non Af Amer: >60 mL/min (ref >60)
GLUCOSE: 167 mg/dL — AB (ref 65–99)
POTASSIUM: 4 mmol/L (ref 3.5–5.1)
SODIUM: 144 mmol/L (ref 135–145)
Total Protein: 7.7 g/dL (ref 6.5–8.1)

## 2015-04-18 LAB — LIPASE, BLOOD: Lipase: 22 U/L (ref 11–51)

## 2015-04-18 LAB — CBC
HEMATOCRIT: 34.9 % — AB (ref 36.0–46.0)
HEMOGLOBIN: 11.1 g/dL — AB (ref 12.0–15.0)
MCH: 29.1 pg (ref 26.0–34.0)
MCHC: 31.8 g/dL (ref 30.0–36.0)
MCV: 91.4 fL (ref 78.0–100.0)
PLATELETS: 318 10*3/uL (ref 150–400)
RBC: 3.82 MIL/uL — ABNORMAL LOW (ref 3.87–5.11)
RDW: 13.5 % (ref 11.5–15.5)
WBC: 15.6 10*3/uL — ABNORMAL HIGH (ref 4.0–10.5)

## 2015-04-18 LAB — URINE MICROSCOPIC-ADD ON: WBC UA: NONE SEEN WBC/hpf (ref 0–5)

## 2015-04-18 LAB — I-STAT BETA HCG BLOOD, ED (MC, WL, AP ONLY)

## 2015-04-18 LAB — URINALYSIS, ROUTINE W REFLEX MICROSCOPIC
Bilirubin Urine: NEGATIVE
Glucose, UA: NEGATIVE mg/dL
KETONES UR: 15 mg/dL — AB
LEUKOCYTES UA: NEGATIVE
NITRITE: NEGATIVE
PROTEIN: 30 mg/dL — AB
Specific Gravity, Urine: 1.021 (ref 1.005–1.030)
pH: 5 (ref 5.0–8.0)

## 2015-04-18 NOTE — ED Notes (Signed)
Per [facility] - pt w/ c/o body aches and fever of 100.9 x4 days, today began experiencing abd pain and n/v. Per EMS pt was already seen for this today at [facility], pt was unhappy with her care at [facility] and attempted to get EMS to transport her from [place] to [facility] however EMS advised that could not be done. Pt was discharged from [facility] after being given Dilaudid and phenergan. Pt was given 4mg  IVP zofran en route by EMS. Per [facility] pt w/ hx substance and drug seeking behaviors.

## 2015-04-18 NOTE — ED Provider Notes (Signed)
CSN: [id number]     Arrival date & time 04/18/15  1824 History  By signing my name below, I, [name], attest that this documentation has been prepared under the direction and in the presence of Tracy Balls, MD. Electronically Signed: Helane [name], ED Scribe. 04/19/2015. 12:04 AM.    Chief Complaint  Patient presents with  . Abdominal Pain  . Vomiting   The history is provided by the patient. No language interpreter was used.   HPI Comments: [name] is a 44 y.o. female with a PMHx of HTN, HLD, and drug-seeking behavior brought in by ambulance, who presents to the Emergency Department complaining of diffuse, cramping, constant abdominal pain and vomiting onset 4 days ago. She reports associated fever, rhinorrhea, productive (greenish-yellow sputum) cough, diarrhea, difficulty urinating, numbness in her feet. Pt states she has been urinating only small amounts of urine. She rpeorts having intermitent abdominal pain for the past year, for which she was seen by a spcialist and diagnosed with C. Diff and turned septic, for which she was admitted. She notes she was discharged 1 month ago and has been on medication since then, but has been unable to take it for the past 3 days due to the vomiting. She states her menstrual cycles have been normal, with her LNMP this month. Pt denies vaginal discharge.   Past Medical History  Diagnosis Date  . PTSD (post-traumatic stress disorder)   . Bulging lumbar disc   . Depression   . Hypertension   . Hyperlipemia   . Headache     "weekly" (10/15/2014)  . DDD (degenerative disc disease), cervical   . Arthritis     "joints ache all over" (10/15/2014)  . Chronic lower back pain   . Anxiety   . Skin cancer     "had them cut off my arms; don't know what kind"  . Drug-seeking behavior    Past Surgical History  Procedure Laterality Date  . Ablation on endometriosis  2008  . Hemorrhoid surgery  ~ 2002   History reviewed. No pertinent family  history. Social History  Substance Use Topics  . Smoking status: Never Smoker   . Smokeless tobacco: Never Used  . Alcohol Use: Yes     Comment: 10/15/2014 "I've drank before; nothing regular; don't drink now cause of RX I'm on"   OB History    No data available     Review of Systems A complete 10 system review of systems was obtained and all systems are negative except as noted in the HPI and PMH.   Allergies  Lactose intolerance (gi)  Home Medications   Prior to Admission medications   Medication Sig Start Date End Date Taking? Authorizing Provider  amLODipine (NORVASC) 10 MG tablet Take 1 tablet (10 mg total) by mouth daily. Patient not taking: Reported on 03/13/2015 10/17/14   Kelvin Cellar, MD  busPIRone (BUSPAR) 7.5 MG tablet Take 1 tablet (7.5 mg total) by mouth 3 (three) times daily. Patient not taking: Reported on 03/13/2015 01/17/15   Barton Dubois, MD  diazepam (VALIUM) 2 MG tablet Take 1 tablet (2 mg total) by mouth every 12 (twelve) hours as needed for anxiety. 01/17/15   Barton Dubois, MD  dicyclomine (BENTYL) 20 MG tablet Take 1 tablet (20 mg total) by mouth 3 (three) times daily before meals. Patient not taking: Reported on 03/13/2015 01/17/15   Barton Dubois, MD  DULoxetine (CYMBALTA) 30 MG capsule Take 1 capsule (30 mg total) by mouth daily. Patient  not taking: Reported on 03/13/2015 12/20/14   Eugenie Filler, MD  HYDROcodone-acetaminophen [facility]) 10-325 MG per tablet Take 1 tablet by mouth every 8 (eight) hours as needed for moderate pain. 01/17/15   Barton Dubois, MD  hydrOXYzine (VISTARIL) 50 MG capsule Take 1 capsule by mouth 2 (two) times daily as needed. anxiety 03/08/15   Historical Provider, MD  lisinopril (PRINIVIL,ZESTRIL) 20 MG tablet Take 1 tablet by mouth daily.    Historical Provider, MD  omeprazole (PRILOSEC) 20 MG capsule Take 1 capsule (20 mg total) by mouth daily. 03/13/15   Marella Chimes, PA-C  ondansetron (ZOFRAN ODT) 4 MG disintegrating  tablet Take 1 tablet (4 mg total) by mouth every 8 (eight) hours as needed for nausea or vomiting. 01/03/15   Oswald Hillock, MD  PARoxetine (PAXIL) 20 MG tablet Take 1 tablet by mouth daily. 03/08/15   Historical Provider, MD  promethazine (PHENERGAN) 25 MG suppository Place 1 suppository (25 mg total) rectally every 6 (six) hours as needed for nausea or vomiting. 01/19/15   Charlann Lange, PA-C  saccharomyces boulardii (FLORASTOR) 250 MG capsule Take 1 capsule (250 mg total) by mouth 2 (two) times daily. 01/17/15   Barton Dubois, MD  sucralfate (CARAFATE) 1 G tablet Take 1 tablet (1 g total) by mouth 4 (four) times daily -  with meals and at bedtime. 03/13/15   Marella Chimes, PA-C  zolpidem (AMBIEN) 10 MG tablet Take 1 tablet by mouth at bedtime as needed. sleep 03/08/15   Historical Provider, MD   BP 148/98 mmHg  Pulse 107  Temp(Src) 99.3 F (37.4 C) (Oral)  Resp 20  Ht 5\' 5"  (1.651 m)  Wt 160 lb (72.576 kg)  BMI 26.63 kg/m2  SpO2 100%  LMP 03/19/2015 Physical Exam  Constitutional: She is oriented to person, place, and time. She appears well-developed and well-nourished. No distress.  Trembling   HENT:  Head: Normocephalic and atraumatic.  Nose: Nose normal.  Mouth/Throat: Oropharynx is clear and moist. No oropharyngeal exudate.  Eyes: Conjunctivae and EOM are normal. Pupils are equal, round, and reactive to light. No scleral icterus.  Neck: Normal range of motion. Neck supple. No JVD present. No tracheal deviation present. No thyromegaly present.  Cardiovascular: Normal rate, regular rhythm and normal heart sounds.  Exam reveals no gallop and no friction rub.   No murmur heard. Pulmonary/Chest: Effort normal and breath sounds normal. No respiratory distress. She has no wheezes. She exhibits no tenderness.  Abdominal: Soft. Bowel sounds are normal. She exhibits no distension and no mass. There is no tenderness. There is no rebound and no guarding.  Musculoskeletal: Normal range of  motion. She exhibits no edema or tenderness.  Lymphadenopathy:    She has no cervical adenopathy.  Neurological: She is alert and oriented to person, place, and time. No cranial nerve deficit. She exhibits normal muscle tone.  Skin: Skin is warm and dry. No rash noted. No erythema. No pallor.  Psychiatric:  anxious  Nursing note and vitals reviewed.   ED Course  Procedures  DIAGNOSTIC STUDIES: Oxygen Saturation is 100% on RA, normal by my interpretation.    COORDINATION OF CARE: 11:54 PM - Discussed plans to order diagnostic studies and IV fluids. Pt advised of plan for treatment and pt agrees.  Labs Review Labs Reviewed  COMPREHENSIVE METABOLIC PANEL - Abnormal; Notable for the following:    CO2 20 (*)    Glucose, Bld 167 (*)    Creatinine, Ser 1.04 (*)  Anion gap 16 (*)    All other components within normal limits  CBC - Abnormal; Notable for the following:    WBC 15.6 (*)    RBC 3.82 (*)    Hemoglobin 11.1 (*)    HCT 34.9 (*)    All other components within normal limits  URINALYSIS, ROUTINE W REFLEX MICROSCOPIC (NOT AT [facility]) - Abnormal; Notable for the following:    APPearance CLOUDY (*)    Hgb urine dipstick TRACE (*)    Ketones, ur 15 (*)    Protein, ur 30 (*)    All other components within normal limits  URINE MICROSCOPIC-ADD ON - Abnormal; Notable for the following:    Squamous Epithelial / LPF 0-5 (*)    Bacteria, UA RARE (*)    Casts GRANULAR CAST (*)    All other components within normal limits  LIPASE, BLOOD  I-STAT BETA HCG BLOOD, ED ([facility], [facility], [facility] ONLY)  POC URINE PREG, ED    Imaging Review No results found. I have personally reviewed and evaluated these images and lab results as part of my medical decision-making.   EKG Interpretation None      MDM   Final diagnoses:  None   Patient presents to the ED for chronic abdominal pain since January 2016.  She states she has had vomiting as well and is rolling around in the bed.  She has no  tenderness on exam and there has been no vomiting during her ED stay.  She was given ativan and benadryl for her symptoms.  Labs a re unremarkable.  She had to be redosed with haldol.  Patient appears much better on physical exam and is comfortable going home.  She will be DC with PCP follow up within 3 days.  VS remain within her normal limits and she is safe for DC.   I personally performed the services described in this documentation, which was scribed in my presence. The recorded information has been reviewed and is accurate.     Tracy Balls, MD 04/19/15 [phone]

## 2015-04-19 MED ORDER — DROPERIDOL 2.5 MG/ML IJ SOLN: 2.5000 mg | Freq: Once | INTRAMUSCULAR | Status: DC

## 2015-04-19 MED ORDER — LORAZEPAM 1 MG PO TABS
1.0000 mg | ORAL_TABLET | Freq: Once | ORAL | Status: AC
  Administered 2015-04-19: 1 mg via ORAL
  Filled 2015-04-19: qty 1

## 2015-04-19 MED ORDER — LORAZEPAM 2 MG/ML IJ SOLN: 1.0000 mg | Freq: Once | INTRAMUSCULAR | Status: DC

## 2015-04-19 MED ORDER — HALOPERIDOL LACTATE 5 MG/ML IJ SOLN: 5.0000 mg | Freq: Once | INTRAMUSCULAR | Status: DC

## 2015-04-19 MED ORDER — LORAZEPAM 2 MG/ML IJ SOLN: 0.5000 mg | Freq: Once | INTRAMUSCULAR | Status: DC

## 2015-04-19 MED ORDER — DIPHENHYDRAMINE HCL 25 MG PO CAPS
50.0000 mg | ORAL_CAPSULE | Freq: Once | ORAL | Status: AC
  Administered 2015-04-19: 50 mg via ORAL
  Filled 2015-04-19: qty 2

## 2015-04-19 MED ORDER — HALOPERIDOL LACTATE 5 MG/ML IJ SOLN
5.0000 mg | Freq: Once | INTRAMUSCULAR | Status: AC
  Administered 2015-04-19: 5 mg via INTRAMUSCULAR
  Filled 2015-04-19: qty 1

## 2015-04-19 NOTE — ED Notes (Signed)
Pt appears more calm. She called her husband to come pick her up.

## 2015-04-19 NOTE — ED Notes (Signed)
Pt A&OX4, ambulatory at d/c with steady gait, NAD. She is waiting in waiting room for her husband to pick her up.

## 2015-04-19 NOTE — Discharge Instructions (Signed)
Chronic Pain [name], see your primary care doctor within 3 days for close follow up. If symptoms worsen, come back to the ED immediately.  Thank you. Chronic pain can be defined as pain that is off and on and lasts for 3-6 months or longer. Many things cause chronic pain, which can make it difficult to make a diagnosis. There are many treatment options available for chronic pain. However, finding a treatment that works well for you may require trying various approaches until the right one is found. Many people benefit from a combination of two or more types of treatment to control their pain. SYMPTOMS  Chronic pain can occur anywhere in the body and can range from mild to very severe. Some types of chronic pain include:  Headache.  Low back pain.  Cancer pain.  Arthritis pain.  Neurogenic pain. This is pain resulting from damage to nerves. People with chronic pain may also have other symptoms such as:  Depression.  Anger.  Insomnia.  Anxiety. DIAGNOSIS  Your health care provider will help diagnose your condition over time. In many cases, the initial focus will be on excluding possible conditions that could be causing the pain. Depending on your symptoms, your health care provider may order tests to diagnose your condition. Some of these tests may include:   Blood tests.   CT scan.   MRI.   X-rays.   Ultrasounds.   Nerve conduction studies.  You may need to see a specialist.  TREATMENT  Finding treatment that works well may take time. You may be referred to a pain specialist. He or she may prescribe medicine or therapies, such as:   Mindful meditation or yoga.  Shots (injections) of numbing or pain-relieving medicines into the spine or area of pain.  Local electrical stimulation.  Acupuncture.   Massage therapy.   Aroma, color, light, or sound therapy.   Biofeedback.   Working with a physical therapist to keep from getting stiff.   Regular,  gentle exercise.   Cognitive or behavioral therapy.   Group support.  Sometimes, surgery may be recommended.  HOME CARE INSTRUCTIONS   Take all medicines as directed by your health care provider.   Lessen stress in your life by relaxing and doing things such as listening to calming music.   Exercise or be active as directed by your health care provider.   Eat a healthy diet and include things such as vegetables, fruits, fish, and lean meats in your diet.   Keep all follow-up appointments with your health care provider.   Attend a support group with others suffering from chronic pain. SEEK MEDICAL CARE IF:   Your pain gets worse.   You develop a new pain that was not there before.   You cannot tolerate medicines given to you by your health care provider.   You have new symptoms since your last visit with your health care provider.  SEEK IMMEDIATE MEDICAL CARE IF:   You feel weak.   You have decreased sensation or numbness.   You lose control of bowel or bladder function.   Your pain suddenly gets much worse.   You develop shaking.  You develop chills.  You develop confusion.  You develop chest pain.  You develop shortness of breath.  MAKE SURE YOU:  Understand these instructions.  Will watch your condition.  Will get help right away if you are not doing well or get worse.   This information is not intended to replace advice  given to you by your health care provider. Make sure you discuss any questions you have with your health care provider.   Document Released: 12/30/2001 Document Revised: 12/10/2012 Document Reviewed: 10/03/2012 Elsevier Interactive Patient Education [employer].

## 2015-04-19 NOTE — ED Notes (Signed)
Pt laying in bed, shaking and crying out in pain. Pt denies talking any illegal drugs and states she does not drink any alcohol.

## 2015-09-23 DIAGNOSIS — J811 Chronic pulmonary edema: Secondary | ICD-10-CM

## 2015-09-24 DIAGNOSIS — R109 Unspecified abdominal pain: Secondary | ICD-10-CM

## 2015-10-07 ENCOUNTER — Inpatient Hospital Stay
Admission: EM | Admit: 2015-10-07 | Discharge: 2015-10-10 | DRG: 690 | Disposition: A | Discharge status: Home / Self Care | Payer: Medicaid Other | Attending: Internal Medicine | Admitting: Internal Medicine

## 2015-10-07 ENCOUNTER — Emergency Department: Payer: Medicaid Other

## 2015-10-07 ENCOUNTER — Encounter: Payer: Self-pay | Admitting: Emergency Medicine

## 2015-10-07 DIAGNOSIS — Z79899 Other long term (current) drug therapy: Secondary | ICD-10-CM

## 2015-10-07 DIAGNOSIS — N390 Urinary tract infection, site not specified: Principal | ICD-10-CM | POA: Diagnosis present

## 2015-10-07 DIAGNOSIS — K219 Gastro-esophageal reflux disease without esophagitis: Secondary | ICD-10-CM | POA: Diagnosis present

## 2015-10-07 DIAGNOSIS — M1990 Unspecified osteoarthritis, unspecified site: Secondary | ICD-10-CM | POA: Diagnosis present

## 2015-10-07 DIAGNOSIS — K5903 Drug induced constipation: Secondary | ICD-10-CM | POA: Diagnosis present

## 2015-10-07 DIAGNOSIS — I10 Essential (primary) hypertension: Secondary | ICD-10-CM | POA: Diagnosis present

## 2015-10-07 DIAGNOSIS — F1123 Opioid dependence with withdrawal: Secondary | ICD-10-CM | POA: Diagnosis present

## 2015-10-07 DIAGNOSIS — Z9889 Other specified postprocedural states: Secondary | ICD-10-CM | POA: Diagnosis not present

## 2015-10-07 DIAGNOSIS — E739 Lactose intolerance, unspecified: Secondary | ICD-10-CM | POA: Diagnosis present

## 2015-10-07 DIAGNOSIS — T402X5A Adverse effect of other opioids, initial encounter: Secondary | ICD-10-CM | POA: Diagnosis present

## 2015-10-07 DIAGNOSIS — Z85828 Personal history of other malignant neoplasm of skin: Secondary | ICD-10-CM

## 2015-10-07 DIAGNOSIS — F418 Other specified anxiety disorders: Secondary | ICD-10-CM | POA: Diagnosis present

## 2015-10-07 DIAGNOSIS — E872 Acidosis, unspecified: Secondary | ICD-10-CM | POA: Diagnosis present

## 2015-10-07 DIAGNOSIS — N809 Endometriosis, unspecified: Secondary | ICD-10-CM | POA: Diagnosis present

## 2015-10-07 DIAGNOSIS — G8929 Other chronic pain: Secondary | ICD-10-CM | POA: Diagnosis present

## 2015-10-07 DIAGNOSIS — M545 Low back pain: Secondary | ICD-10-CM | POA: Diagnosis present

## 2015-10-07 DIAGNOSIS — N179 Acute kidney failure, unspecified: Secondary | ICD-10-CM | POA: Diagnosis present

## 2015-10-07 DIAGNOSIS — E8729 Other acidosis: Secondary | ICD-10-CM

## 2015-10-07 LAB — COMPREHENSIVE METABOLIC PANEL
ALK PHOS: 71 U/L (ref 38–126)
ALT: 18 U/L (ref 14–54)
ANION GAP: 22 — AB (ref 5–15)
AST: 58 U/L — ABNORMAL HIGH (ref 15–41)
Albumin: 4.3 g/dL (ref 3.5–5.0)
BILIRUBIN TOTAL: 1 mg/dL (ref 0.3–1.2)
BUN: 7 mg/dL (ref 6–20)
CALCIUM: 9.6 mg/dL (ref 8.9–10.3)
CO2: 8 mmol/L — AB (ref 22–32)
Chloride: 113 mmol/L — ABNORMAL HIGH (ref 101–111)
Creatinine, Ser: 1.27 mg/dL — ABNORMAL HIGH (ref 0.44–1.00)
GFR calc non Af Amer: 51 mL/min — ABNORMAL LOW (ref >60)
GFR, EST AFRICAN AMERICAN: 59 mL/min — AB (ref >60)
GLUCOSE: 189 mg/dL — AB (ref 65–99)
POTASSIUM: 4.2 mmol/L (ref 3.5–5.1)
SODIUM: 143 mmol/L (ref 135–145)
TOTAL PROTEIN: 8.5 g/dL — AB (ref 6.5–8.1)

## 2015-10-07 LAB — URINALYSIS COMPLETE WITH MICROSCOPIC (ARMC ONLY)
Bacteria, UA: NONE SEEN
Bilirubin Urine: NEGATIVE
Glucose, UA: NEGATIVE mg/dL
Nitrite: NEGATIVE
PH: 5 (ref 5.0–8.0)
Protein, ur: 100 mg/dL — AB
Specific Gravity, Urine: 1.014 (ref 1.005–1.030)

## 2015-10-07 LAB — URINE DRUG SCREEN, QUALITATIVE (ARMC ONLY)
AMPHETAMINES, UR SCREEN: NOT DETECTED
BENZODIAZEPINE, UR SCRN: NOT DETECTED
Barbiturates, Ur Screen: NOT DETECTED
CANNABINOID 50 NG, UR ~~LOC~~: NOT DETECTED
Cocaine Metabolite,Ur ~~LOC~~: NOT DETECTED
MDMA (ECSTASY) UR SCREEN: NOT DETECTED
Methadone Scn, Ur: NOT DETECTED
OPIATE, UR SCREEN: POSITIVE — AB
PHENCYCLIDINE (PCP) UR S: NOT DETECTED
Tricyclic, Ur Screen: NOT DETECTED

## 2015-10-07 LAB — CBC WITH DIFFERENTIAL/PLATELET
Basophils Absolute: 0.1 10*3/uL (ref 0–0.1)
Basophils Relative: 0 %
EOS ABS: 0 10*3/uL (ref 0–0.7)
Eosinophils Relative: 0 %
HCT: 35.9 % (ref 35.0–47.0)
HEMOGLOBIN: 11.5 g/dL — AB (ref 12.0–16.0)
LYMPHS ABS: 0.7 10*3/uL — AB (ref 1.0–3.6)
Lymphocytes Relative: 3 %
MCH: 27.5 pg (ref 26.0–34.0)
MCHC: 31.9 g/dL — AB (ref 32.0–36.0)
MCV: 86.2 fL (ref 80.0–100.0)
MONO ABS: 1 10*3/uL — AB (ref 0.2–0.9)
Neutro Abs: 19.2 10*3/uL — ABNORMAL HIGH (ref 1.4–6.5)
Platelets: 532 10*3/uL — ABNORMAL HIGH (ref 150–440)
RBC: 4.17 MIL/uL (ref 3.80–5.20)
RDW: 15 % — AB (ref 11.5–14.5)
WBC: 21 10*3/uL — ABNORMAL HIGH (ref 3.6–11.0)

## 2015-10-07 LAB — HCG, QUANTITATIVE, PREGNANCY: HCG, BETA CHAIN, QUANT, S: 1 m[IU]/mL (ref <5)

## 2015-10-07 LAB — ACETAMINOPHEN LEVEL: Acetaminophen (Tylenol), Serum: <10 ug/mL — ABNORMAL LOW (ref 10–30)

## 2015-10-07 LAB — ETHANOL: Alcohol, Ethyl (B): <5 mg/dL (ref <5)

## 2015-10-07 LAB — LIPASE, BLOOD: LIPASE: 15 U/L (ref 11–51)

## 2015-10-07 LAB — LACTIC ACID, PLASMA: LACTIC ACID, VENOUS: 6.5 mmol/L — AB (ref 0.5–2.0)

## 2015-10-07 MED ORDER — LISINOPRIL 20 MG PO TABS
20.0000 mg | ORAL_TABLET | Freq: Every day | ORAL | Status: DC
  Administered 2015-10-08 – 2015-10-10 (×3): 20 mg via ORAL
  Filled 2015-10-07: qty 2
  Filled 2015-10-07 (×2): qty 1

## 2015-10-07 MED ORDER — DIAZEPAM 2 MG PO TABS
2.0000 mg | ORAL_TABLET | Freq: Two times a day (BID) | ORAL | Status: DC | PRN
  Administered 2015-10-08 – 2015-10-09 (×2): 2 mg via ORAL
  Filled 2015-10-07: qty 1

## 2015-10-07 MED ORDER — DIPHENHYDRAMINE HCL 50 MG/ML IJ SOLN
INTRAMUSCULAR | Status: AC
  Administered 2015-10-07: 25 mg via INTRAMUSCULAR
  Filled 2015-10-07: qty 1

## 2015-10-07 MED ORDER — DIPHENHYDRAMINE HCL 50 MG/ML IJ SOLN: 25.0000 mg | Freq: Once | INTRAMUSCULAR | Status: DC

## 2015-10-07 MED ORDER — ACETAMINOPHEN 650 MG RE SUPP: 650.0000 mg | Freq: Four times a day (QID) | RECTAL | Status: DC | PRN

## 2015-10-07 MED ORDER — ONDANSETRON HCL 4 MG/2ML IJ SOLN
4.0000 mg | Freq: Once | INTRAMUSCULAR | Status: AC
  Administered 2015-10-07: 4 mg via INTRAVENOUS
  Filled 2015-10-07: qty 2

## 2015-10-07 MED ORDER — IOPAMIDOL (ISOVUE-300) INJECTION 61%
100.0000 mL | Freq: Once | INTRAVENOUS | Status: AC | PRN
  Administered 2015-10-07: 100 mL via INTRAVENOUS

## 2015-10-07 MED ORDER — STERILE WATER FOR INJECTION IV SOLN: INTRAVENOUS | Status: DC

## 2015-10-07 MED ORDER — HALOPERIDOL LACTATE 5 MG/ML IJ SOLN
INTRAMUSCULAR | Status: AC
  Administered 2015-10-07: 5 mg via INTRAMUSCULAR
  Filled 2015-10-07: qty 1

## 2015-10-07 MED ORDER — PROMETHAZINE HCL 25 MG RE SUPP: 25.0000 mg | Freq: Four times a day (QID) | RECTAL | Status: DC | PRN

## 2015-10-07 MED ORDER — BUSPIRONE HCL 5 MG PO TABS
7.5000 mg | ORAL_TABLET | Freq: Three times a day (TID) | ORAL | Status: DC
  Administered 2015-10-08 – 2015-10-10 (×8): 7.5 mg via ORAL
  Filled 2015-10-07 (×8): qty 2

## 2015-10-07 MED ORDER — ONDANSETRON HCL 4 MG/2ML IJ SOLN: 4.0000 mg | Freq: Four times a day (QID) | INTRAMUSCULAR | Status: DC | PRN

## 2015-10-07 MED ORDER — MORPHINE SULFATE (PF) 2 MG/ML IV SOLN
2.0000 mg | INTRAVENOUS | Status: DC | PRN
  Administered 2015-10-08: 2 mg via INTRAVENOUS
  Filled 2015-10-07: qty 1

## 2015-10-07 MED ORDER — SODIUM CHLORIDE 0.9 % IV BOLUS (SEPSIS)
1000.0000 mL | Freq: Once | INTRAVENOUS | Status: AC
  Administered 2015-10-07: 1000 mL via INTRAVENOUS

## 2015-10-07 MED ORDER — LORAZEPAM 2 MG/ML IJ SOLN: 2.0000 mg | Freq: Once | INTRAMUSCULAR | Status: DC

## 2015-10-07 MED ORDER — METOCLOPRAMIDE HCL 5 MG/ML IJ SOLN
5.0000 mg | Freq: Four times a day (QID) | INTRAMUSCULAR | Status: DC
Start: 2015-10-08 — End: 2015-10-10
  Administered 2015-10-08 – 2015-10-10 (×12): 5 mg via INTRAVENOUS
  Filled 2015-10-07 (×10): qty 2

## 2015-10-07 MED ORDER — ONDANSETRON HCL 4 MG PO TABS
8.0000 mg | ORAL_TABLET | Freq: Four times a day (QID) | ORAL | Status: DC | PRN
  Administered 2015-10-08: 8 mg via ORAL
  Filled 2015-10-07: qty 2

## 2015-10-07 MED ORDER — DEXTROSE 5 % AND 0.9 % NACL IV BOLUS
1000.0000 mL | Freq: Once | INTRAVENOUS | Status: AC
  Administered 2015-10-07: 1000 mL via INTRAVENOUS

## 2015-10-07 MED ORDER — LORAZEPAM 2 MG/ML IJ SOLN
2.0000 mg | Freq: Once | INTRAMUSCULAR | Status: AC
  Administered 2015-10-07: 2 mg via INTRAMUSCULAR

## 2015-10-07 MED ORDER — DIATRIZOATE MEGLUMINE & SODIUM 66-10 % PO SOLN
15.0000 mL | Freq: Once | ORAL | Status: AC
  Administered 2015-10-07: 15 mL via ORAL

## 2015-10-07 MED ORDER — VANCOMYCIN HCL 10 G IV SOLR
1500.0000 mg | Freq: Once | INTRAVENOUS | Status: AC
  Administered 2015-10-08: 1500 mg via INTRAVENOUS
  Filled 2015-10-07: qty 1500

## 2015-10-07 MED ORDER — CLONIDINE HCL 0.1 MG PO TABS
0.2000 mg | ORAL_TABLET | Freq: Once | ORAL | Status: AC
Start: 2015-10-07 — End: 2015-10-07
  Administered 2015-10-07: 0.2 mg via ORAL
  Filled 2015-10-07: qty 2

## 2015-10-07 MED ORDER — DULOXETINE HCL 30 MG PO CPEP
30.0000 mg | ORAL_CAPSULE | Freq: Every day | ORAL | Status: DC
  Administered 2015-10-08 – 2015-10-10 (×3): 30 mg via ORAL
  Filled 2015-10-07 (×3): qty 1

## 2015-10-07 MED ORDER — SUCRALFATE 1 G PO TABS
1.0000 g | ORAL_TABLET | Freq: Three times a day (TID) | ORAL | Status: DC
Start: 2015-10-08 — End: 2015-10-10
  Administered 2015-10-08 – 2015-10-10 (×9): 1 g via ORAL
  Filled 2015-10-07 (×9): qty 1

## 2015-10-07 MED ORDER — AMLODIPINE BESYLATE 10 MG PO TABS
10.0000 mg | ORAL_TABLET | Freq: Every day | ORAL | Status: DC
  Administered 2015-10-08 – 2015-10-10 (×4): 10 mg via ORAL
  Filled 2015-10-07 (×2): qty 1
  Filled 2015-10-07: qty 2

## 2015-10-07 MED ORDER — ZOLPIDEM TARTRATE 5 MG PO TABS: 10.0000 mg | ORAL_TABLET | Freq: Every evening | ORAL | Status: DC | PRN

## 2015-10-07 MED ORDER — PIPERACILLIN-TAZOBACTAM 3.375 G IVPB 30 MIN
3.3750 g | Freq: Once | INTRAVENOUS | Status: AC
  Administered 2015-10-07: 3.375 g via INTRAVENOUS
  Filled 2015-10-07: qty 50

## 2015-10-07 MED ORDER — FLUOXETINE HCL 20 MG PO CAPS
20.0000 mg | ORAL_CAPSULE | Freq: Every day | ORAL | Status: DC
  Administered 2015-10-08 – 2015-10-10 (×3): 20 mg via ORAL
  Filled 2015-10-07 (×3): qty 1

## 2015-10-07 MED ORDER — LORAZEPAM 2 MG/ML IJ SOLN
INTRAMUSCULAR | Status: AC
  Administered 2015-10-07: 2 mg via INTRAMUSCULAR
  Filled 2015-10-07: qty 1

## 2015-10-07 MED ORDER — HALOPERIDOL LACTATE 5 MG/ML IJ SOLN
5.0000 mg | Freq: Once | INTRAMUSCULAR | Status: AC
  Administered 2015-10-07: 5 mg via INTRAMUSCULAR

## 2015-10-07 MED ORDER — DIAZEPAM 5 MG/ML IJ SOLN
10.0000 mg | Freq: Once | INTRAMUSCULAR | Status: AC
  Administered 2015-10-07: 10 mg via INTRAVENOUS
  Filled 2015-10-07: qty 2

## 2015-10-07 MED ORDER — DICYCLOMINE HCL 10 MG PO CAPS
20.0000 mg | ORAL_CAPSULE | Freq: Three times a day (TID) | ORAL | Status: DC
  Administered 2015-10-08 – 2015-10-10 (×7): 20 mg via ORAL
  Filled 2015-10-07 (×6): qty 2
  Filled 2015-10-07: qty 1

## 2015-10-07 MED ORDER — HYDROCODONE-ACETAMINOPHEN 10-325 MG PO TABS
1.0000 | ORAL_TABLET | Freq: Three times a day (TID) | ORAL | Status: DC | PRN
  Administered 2015-10-08 – 2015-10-10 (×7): 1 via ORAL
  Filled 2015-10-07 (×7): qty 1

## 2015-10-07 MED ORDER — PANTOPRAZOLE SODIUM 40 MG PO TBEC
40.0000 mg | DELAYED_RELEASE_TABLET | Freq: Every day | ORAL | Status: DC
Start: 2015-10-08 — End: 2015-10-10
  Administered 2015-10-08 – 2015-10-10 (×3): 40 mg via ORAL
  Filled 2015-10-07 (×3): qty 1

## 2015-10-07 MED ORDER — SACCHAROMYCES BOULARDII 250 MG PO CAPS
250.0000 mg | ORAL_CAPSULE | Freq: Two times a day (BID) | ORAL | Status: DC
  Administered 2015-10-08 – 2015-10-10 (×6): 250 mg via ORAL
  Filled 2015-10-07 (×8): qty 1

## 2015-10-07 MED ORDER — ENOXAPARIN SODIUM 40 MG/0.4ML ~~LOC~~ SOLN
40.0000 mg | SUBCUTANEOUS | Status: DC
  Administered 2015-10-08 – 2015-10-09 (×2): 40 mg via SUBCUTANEOUS
  Filled 2015-10-07 (×2): qty 0.4

## 2015-10-07 MED ORDER — BISACODYL 10 MG RE SUPP: 10.0000 mg | Freq: Every day | RECTAL | Status: DC | PRN

## 2015-10-07 MED ORDER — ACETAMINOPHEN 325 MG PO TABS: 650.0000 mg | ORAL_TABLET | Freq: Four times a day (QID) | ORAL | Status: DC | PRN

## 2015-10-07 MED ORDER — SODIUM CHLORIDE 0.9 % IV SOLN: INTRAVENOUS | Status: DC

## 2015-10-07 MED ORDER — TIZANIDINE HCL 4 MG PO TABS
2.0000 mg | ORAL_TABLET | Freq: Four times a day (QID) | ORAL | Status: DC | PRN
  Administered 2015-10-08 – 2015-10-09 (×5): 2 mg via ORAL
  Filled 2015-10-07 (×5): qty 1

## 2015-10-07 MED ORDER — DIPHENHYDRAMINE HCL 50 MG/ML IJ SOLN
25.0000 mg | Freq: Once | INTRAMUSCULAR | Status: AC
  Administered 2015-10-07: 25 mg via INTRAMUSCULAR

## 2015-10-07 NOTE — H&P (Signed)
[facility] NAME: [name]    MR#:  [id number]  DATE OF BIRTH:  11-17-1970  DATE OF ADMISSION:  10/07/2015  PRIMARY CARE PHYSICIAN: Leonard Downing, MD   REQUESTING/REFERRING PHYSICIAN: Case discussed with. Dr. Joni Fears in the ER  CHIEF COMPLAINT:   Chief Complaint  Patient presents with  . Withdrawal    HISTORY OF PRESENT ILLNESS:  [name]  is a 45 y.o. female with a known history of Hypertension, GERD, chronic low back pain on narcotics, anxiety and depression given hospital because patient was complaining of some midepigastric abdominal pain which she said was 10 out of 10 in severity, she's had associated nausea and vomiting worse over the past 12 hours. She's had some mild diarrhea was watery brown about 3-4 episodes daily. She denied having a fever, symmetry and having some tremor she's been having quite a bit of yawning. Patient said that she ran out of her Vicodin 10-3 25 one month supply per patient about 12 hours ago was last time she took Benadryl but according to ER documented 3 days ago. Denied taking any alcohol. Patient did report some chest discomfort and some palpitations. Denied any excessive shortness of breath, has not had any cough or congestion. Denied wheezing. Patient did report some mild dysuria but denied any frequency or urgency.  In the emergency room patient has been afebrile, she has had elevated blood pressure as high as 160/103, she's been tachycardic up to 137 with some tachypnea 20 breaths per minute saturating 97% on room air. CBC revealed a elevated white blood cell count 21, 92% which are neutrophils. Platelet count is elevated at 532,000. Her metabolic panel showed normal serum sodium 143, elevated chloride at 113, decreased CO2 at 8, BUN was normal at 7 creatinine was mildly elevated at 1.27. 5 months ago normal at 1.04. Mildly elevated AST at 58, ALT was normal at 18. She had a normal  lipase of 15, UA shows specific gravity 1.014, negative for nitrites, trace leukocyte esterase, 6-30 WBCs.lactic acid was elevated at 6.5. Chest x-ray shows no consolidation, CT her abdomen pelvis was benign. Agitated on arrival and so was given 10 mg Haldol, 50 mg of Benadryl and 2 mg Ativan. Currently very restless, unable sit still, complaining of feeling nauseous and having abdominal pain. Because of the leukocytosis and elevated lactic acid patient did receive a dose of vancomycin and Zosyn after blood cultures were collected. She's been given 2 mg Valium and since she lacks a little bit. For suspected opioid withdrawal hospitalist called.  PAST MEDICAL HISTORY:   Past Medical History  Diagnosis Date  . PTSD (post-traumatic stress disorder)   . Bulging lumbar disc   . Depression   . Hypertension   . Hyperlipemia   . Headache     "weekly" (10/15/2014)  . DDD (degenerative disc disease), cervical   . Arthritis     "joints ache all over" (10/15/2014)  . Chronic lower back pain   . Anxiety   . Skin cancer     "had them cut off my arms; don't know what kind"  . Drug-seeking behavior     PAST SURGICAL HISTORY:   Past Surgical History  Procedure Laterality Date  . Ablation on endometriosis  2008  . Hemorrhoid surgery  ~ 2002    SOCIAL HISTORY:   Social History  Substance Use Topics  . Smoking status: Never Smoker   . Smokeless tobacco: Never Used  .  Alcohol Use: Yes     Comment: 10/15/2014 "I've drank before; nothing regular; don't drink now cause of RX I'm on"    FAMILY HISTORY:  No family history on file.  DRUG ALLERGIES:   Allergies  Allergen Reactions  . Lactose Intolerance (Gi) Nausea Only    REVIEW OF SYSTEMS:   ROS  MEDICATIONS AT HOME:   Prior to Admission medications   Medication Sig Start Date End Date Taking? Authorizing Provider  diazepam (VALIUM) 2 MG tablet Take 1 tablet (2 mg total) by mouth every 12 (twelve) hours as needed for anxiety. 01/17/15   Yes Barton Dubois, MD  FLUoxetine (PROZAC) 20 MG capsule Take 20 mg by mouth daily.   Yes Historical Provider, MD  HYDROcodone-acetaminophen (NORCO) 10-325 MG per tablet Take 1 tablet by mouth every 8 (eight) hours as needed for moderate pain. 01/17/15  Yes Barton Dubois, MD  lisinopril (PRINIVIL,ZESTRIL) 20 MG tablet Take 1 tablet by mouth daily.   Yes Historical Provider, MD  omeprazole (PRILOSEC) 20 MG capsule Take 1 capsule (20 mg total) by mouth daily. 03/13/15  Yes Marella Chimes, PA-C  saccharomyces boulardii (FLORASTOR) 250 MG capsule Take 1 capsule (250 mg total) by mouth 2 (two) times daily. 01/17/15  Yes Barton Dubois, MD  tiZANidine (ZANAFLEX) 2 MG tablet Take 2 mg by mouth every 6 (six) hours as needed for muscle spasms.   Yes Historical Provider, MD  zolpidem (AMBIEN) 10 MG tablet Take 1 tablet by mouth at bedtime as needed. sleep 03/08/15  Yes Historical Provider, MD  amLODipine (NORVASC) 10 MG tablet Take 1 tablet (10 mg total) by mouth daily. Patient not taking: Reported on 03/13/2015 10/17/14   Kelvin Cellar, MD  busPIRone (BUSPAR) 7.5 MG tablet Take 1 tablet (7.5 mg total) by mouth 3 (three) times daily. Patient not taking: Reported on 03/13/2015 01/17/15   Barton Dubois, MD  dicyclomine (BENTYL) 20 MG tablet Take 1 tablet (20 mg total) by mouth 3 (three) times daily before meals. Patient not taking: Reported on 03/13/2015 01/17/15   Barton Dubois, MD  DULoxetine (CYMBALTA) 30 MG capsule Take 1 capsule (30 mg total) by mouth daily. Patient not taking: Reported on 03/13/2015 12/20/14   Eugenie Filler, MD  ondansetron (ZOFRAN ODT) 4 MG disintegrating tablet Take 1 tablet (4 mg total) by mouth every 8 (eight) hours as needed for nausea or vomiting. Patient not taking: Reported on 10/07/2015 01/03/15   Oswald Hillock, MD  promethazine (PHENERGAN) 25 MG suppository Place 1 suppository (25 mg total) rectally every 6 (six) hours as needed for nausea or vomiting. Patient not taking:  Reported on 10/07/2015 01/19/15   Charlann Lange, PA-C  sucralfate (CARAFATE) 1 G tablet Take 1 tablet (1 g total) by mouth 4 (four) times daily -  with meals and at bedtime. Patient not taking: Reported on 10/07/2015 03/13/15   Marella Chimes, PA-C      VITAL SIGNS:  Blood pressure 157/12, pulse 137, temperature 99 F (37.2 C), temperature source Oral, resp. rate 28, height 5\' 6"  (1.676 m), weight 95.255 kg (210 lb), SpO2 100 %.  PHYSICAL EXAMINATION:  Physical Exam  GENERAL:  45 y.o.-year-old patient lying in the bed with no acute distress. Restless in bed, and she can't sit still EYES: Pupils equal, round, reactive to light and accommodation. Dilated pupils bilaterally. No scleral icterus. Extraocular muscles intact.  HEENT: Head atraumatic, normocephalic. Oropharynx and nasopharynx clear.  NECK:  Supple, no jugular venous distention. No thyroid enlargement, no  tenderness.  LUNGS: Normal breath sounds bilaterally, no wheezing, rales,rhonchi or crepitation. No use of accessory muscles of respiration.  CARDIOVASCULAR: Tachycardic without appreciable murmur ABDOMEN: Soft, nontender, nondistended. Hyperactive bowel sounds. Tender to palpation No organomegaly or mass.  EXTREMITIES: No pedal edema, cyanosis, or clubbing.  NEUROLOGIC: Cranial nerves II through XII are intact. Muscle strength 5/5 in all extremities. Sensation intact. Gait not checked.  PSYCHIATRIC: The patient is alert and oriented x 3.  SKIN: No obvious rash, lesion, or ulcer.   LABORATORY PANEL:   CBC  Recent Labs Lab 10/07/15 1930  WBC 21.0*  HGB 11.5*  HCT 35.9  PLT 532*   ------------------------------------------------------------------------------------------------------------------  Chemistries   Recent Labs Lab 10/07/15 1930  NA 143  K 4.2  CL 113*  CO2 8*  GLUCOSE 189*  BUN 7  CREATININE 1.27*  CALCIUM 9.6  AST 58*  ALT 18  ALKPHOS 71  BILITOT 1.0    ------------------------------------------------------------------------------------------------------------------  Cardiac Enzymes No results for input(s): TROPONINI in the last 168 hours. ------------------------------------------------------------------------------------------------------------------  RADIOLOGY:  Ct Abdomen Pelvis W Contrast  10/07/2015  CLINICAL DATA:  Nausea vomiting and sledding. EXAM: CT ABDOMEN AND PELVIS WITH CONTRAST TECHNIQUE: Multidetector CT imaging of the abdomen and pelvis was performed using the standard protocol following bolus administration of intravenous contrast. CONTRAST:  169mL ISOVUE-300 IOPAMIDOL (ISOVUE-300) INJECTION 61% COMPARISON:  Abdominal radiograph 09/18/2015 FINDINGS: Lower chest:  No acute findings. Hepatobiliary: No masses or other significant abnormality. Pancreas: No mass, inflammatory changes, or other significant abnormality. Spleen: Within normal limits in size and appearance. Adrenals/Urinary Tract: No masses identified. No evidence of hydronephrosis. Stomach/Bowel: No evidence of obstruction, inflammatory process, or abnormal fluid collections. The appendix is normal. Vascular/Lymphatic: No pathologically enlarged lymph nodes. No evidence of abdominal aortic aneurysm. Reproductive: No mass or other significant abnormality. Other: None. Musculoskeletal:  No suspicious bone lesions identified. IMPRESSION: No evidence of acute abnormalities within the abdomen or pelvis. Electronically Signed   By: Fidela Salisbury M.D.   On: 10/07/2015 22:31   Dg Chest Portable 1 View  10/07/2015  CLINICAL DATA:  Nausea, sweating and shaking. EXAM: PORTABLE CHEST 1 VIEW COMPARISON:  09/23/2015 FINDINGS: The heart size and mediastinal contours are within normal limits. Both lungs are clear. The visualized skeletal structures are unremarkable. IMPRESSION: No active disease. Electronically Signed   By: Fidela Salisbury M.D.   On: 10/07/2015 23:23       IMPRESSION AND PLAN:   4 female admitted suspected opioid withdrawal  1. Opioid withdrawal. Patient has dilated pupils, she is been having nausea vomiting abdominal pain with diarrhea. For restless and can't sit still, his tachycardia monitor. Last time she took opioids was at least 12 hours ago, ran out of her 30 day supply was then 2 weeks. We'll start the patient on some IV morphine, will give her a twice a day clonidine, provide when necessary benzodiazepines with Valium if she is able to swallow. We'll give her antiemetics with Zofran, routine Reglan. Monitor on telemetry. Serial tachycardia lactic acidosis will place her in the stepdown unit for now.  2. Lactic acidosis. Patient's been having copious diarrhea, may be contributing, clinically appears to be dehydrated. We'll give her some saline fluid bolus and then put her on half-normal saline with sodium bicarbonate. Continue to trend her lactic acid was 3 hours. No source for infection at this time, her chest x-ray and CT of the abdomen and pelvis were benign, urine analysis does not indicate a UTI at this time. Patient received vancomycin  and Zosyn in the emergency room, she's been afebrile since arrival. Will trend her CBC, hold antibiotics at this point.  3. Sinus tachycardia. Provide patient with IV fluid, check troponin. Her on telemetry. Tender to opioid withdrawal.  4. History of endometriosis. Reportedly this and some low back pain represents patient's chronically on narcotics. Reportedly had surgery for endometriosis in the past.  5. Chronic low back pain. Will resume patient's home medications. Will likely need outpatient pain management  6. Hypertension. Continue amlodipine 10 mg daily lisinopril 20 mg daily. We'll provide when necessary hydralazine.  7. Depression. Continue duloxetine 30 mg daily, BuSpar 7.5 mg 3 times a day, Valium 2 mg twice a day when necessary  8. Opioid-induced constipation. Continue Bentyl 20 mg  3 times a day  9. GERD. Continue PPI, substitute omeprazole. Protonix 40 mg daily  10. Acute kidney injury. Creatinine is mildly elevated 1.27. Give her IV fluids, repeat metabolic panel.  11. DVT prophylaxis. Lovenox 40 mg daily    All the records are reviewed and case discussed with ED provider. Management plans discussed with the patient, family and they are in agreement.  CODE STATUS: Full code  TOTAL TIME TAKING CARE OF THIS PATIENT: 40 minutes.    Alesia Richards M.D on 10/07/2015 at 11:55 PM  Between 7am to 6pm - Pager - [phone]  After 6pm go to [email or web address] - Proofreader  [facility]  Office  [phone]  CC: Primary care physician; Leonard Downing, MD   Note: This dictation was prepared with Dragon dictation along with smaller phrase technology. Any transcriptional errors that result from this process are unintentional.

## 2015-10-07 NOTE — ED Notes (Signed)
CT called and informed pt is ready for scan. Pt continues to roll around in bed but reports she feels as though she can hold still for the scan to be conducted.

## 2015-10-07 NOTE — ED Notes (Signed)
Husband [phone] Mila Palmer)

## 2015-10-07 NOTE — ED Provider Notes (Signed)
[facility] Emergency Department Provider Note  ____________________________________________  Time seen: 6:45 PM  I have reviewed the triage vital signs and the nursing notes.   HISTORY  Chief Complaint Withdrawal    HPI UNICA KRENZEL is a 45 y.o. female who complains of generalized abdominal pain with nausea vomiting and diarrhea since today. She reports that she's been taking Vicodin for the past few months and ran out about 3 days ago. Complains of sweating and shaking as well. Denies alcohol or benzodiazepine abuse. No fevers or chills. Request pain medication.     Past Medical History  Diagnosis Date  . PTSD (post-traumatic stress disorder)   . Bulging lumbar disc   . Depression   . Hypertension   . Hyperlipemia   . Headache     "weekly" (10/15/2014)  . DDD (degenerative disc disease), cervical   . Arthritis     "joints ache all over" (10/15/2014)  . Chronic lower back pain   . Anxiety   . Skin cancer     "had them cut off my arms; don't know what kind"  . Drug-seeking behavior      Patient Active Problem List   Diagnosis Date Noted  . Hypomagnesemia   . AKI (acute kidney injury) ([place])   . C. difficile colitis   . SIRS (systemic inflammatory response syndrome) ([place]) 01/11/2015  . Cellulitis 01/03/2015  . Tachycardia   . Dyspnea   . Opiate withdrawal ([place]) 12/18/2014  . Depression 12/17/2014  . Hypotension 12/16/2014  . Herpes simplex virus type 1 (HSV-1) dermatitis   . Sacral fracture ([place]) 12/12/2014  . Fall 12/12/2014  . Nausea and vomiting 12/12/2014  . Leukocytosis 12/12/2014  . Chronic lower back pain 12/12/2014  . Sacral fracture, closed ([place]) 12/12/2014  . Shingles outbreak 12/12/2014  . Nausea with vomiting   . Intractable abdominal pain 10/15/2014  . Abdominal pain 10/15/2014  . Hypokalemia 09/04/2014  . Sepsis ([place]) 09/01/2014  . Abdominal pain, generalized 09/01/2014  . PTSD (post-traumatic stress disorder)  09/01/2014  . Endometriosis 09/01/2014  . Nausea vomiting and diarrhea 09/01/2014  . Sinus tachycardia ([place]) 09/01/2014  . Lactic acidosis 09/01/2014  . Severe sepsis ([place]) 09/01/2014     Past Surgical History  Procedure Laterality Date  . Ablation on endometriosis  2008  . Hemorrhoid surgery  ~ 2002     Current Outpatient Rx  Name  Route  Sig  Dispense  Refill  . amLODipine (NORVASC) 10 MG tablet   Oral   Take 1 tablet (10 mg total) by mouth daily. Patient not taking: Reported on 03/13/2015   30 tablet   2   . busPIRone (BUSPAR) 7.5 MG tablet   Oral   Take 1 tablet (7.5 mg total) by mouth 3 (three) times daily. Patient not taking: Reported on 03/13/2015   90 tablet   0   . diazepam (VALIUM) 2 MG tablet   Oral   Take 1 tablet (2 mg total) by mouth every 12 (twelve) hours as needed for anxiety.   15 tablet   0   . dicyclomine (BENTYL) 20 MG tablet   Oral   Take 1 tablet (20 mg total) by mouth 3 (three) times daily before meals. Patient not taking: Reported on 03/13/2015   90 tablet   0   . DULoxetine (CYMBALTA) 30 MG capsule   Oral   Take 1 capsule (30 mg total) by mouth daily. Patient not taking: Reported on 03/13/2015   30 capsule  0   . HYDROcodone-acetaminophen (NORCO) 10-325 MG per tablet   Oral   Take 1 tablet by mouth every 8 (eight) hours as needed for moderate pain.         . hydrOXYzine (VISTARIL) 50 MG capsule   Oral   Take 1 capsule by mouth 2 (two) times daily as needed. anxiety         . lisinopril (PRINIVIL,ZESTRIL) 20 MG tablet   Oral   Take 1 tablet by mouth daily.         Marland Kitchen omeprazole (PRILOSEC) 20 MG capsule   Oral   Take 1 capsule (20 mg total) by mouth daily.   30 capsule   0   . ondansetron (ZOFRAN ODT) 4 MG disintegrating tablet   Oral   Take 1 tablet (4 mg total) by mouth every 8 (eight) hours as needed for nausea or vomiting.   20 tablet   0   . PARoxetine (PAXIL) 20 MG tablet   Oral   Take 1 tablet by mouth  daily.         . promethazine (PHENERGAN) 25 MG suppository   Rectal   Place 1 suppository (25 mg total) rectally every 6 (six) hours as needed for nausea or vomiting.   12 each   0   . saccharomyces boulardii (FLORASTOR) 250 MG capsule   Oral   Take 1 capsule (250 mg total) by mouth 2 (two) times daily.   60 capsule   0   . sucralfate (CARAFATE) 1 G tablet   Oral   Take 1 tablet (1 g total) by mouth 4 (four) times daily -  with meals and at bedtime.   30 tablet   0   . zolpidem (AMBIEN) 10 MG tablet   Oral   Take 1 tablet by mouth at bedtime as needed. sleep            Allergies Lactose intolerance (gi)   No family history on file.  Social History Social History  Substance Use Topics  . Smoking status: Never Smoker   . Smokeless tobacco: Never Used  . Alcohol Use: Yes     Comment: 10/15/2014 "I've drank before; nothing regular; don't drink now cause of RX I'm on"    Review of Systems  Constitutional:   No fever or chills.  Eyes:   No vision changes.  ENT:   No sore throat. No rhinorrhea. Cardiovascular:   No chest pain. Respiratory:   No dyspnea or cough. Gastrointestinal:   Positive generalized abdominal pain with vomiting and diarrhea. Genitourinary:   Negative for dysuria or difficulty urinating. Musculoskeletal:   Negative for focal pain or swelling Neurological:   Negative for headaches 10-point ROS otherwise negative.  ____________________________________________   PHYSICAL EXAM:  VITAL SIGNS: ED Triage Vitals  Enc Vitals Group     BP 10/07/15 1829 160/103 mmHg     Pulse Rate 10/07/15 1829 108     Resp 10/07/15 1829 28     Temp 10/07/15 1829 99 F (37.2 C)     Temp Source 10/07/15 1829 Oral     SpO2 10/07/15 1829 97 %     Weight 10/07/15 1829 210 lb (95.255 kg)     Height 10/07/15 1829 5\' 6"  (1.676 m)     Head Cir --      Peak Flow --      Pain Score 10/07/15 1830 10     Pain Loc --      Pain Edu? --  Excl. in [place]? --      Vital signs reviewed, nursing assessments reviewed.   Constitutional:   Alert and oriented. Moderate distress, moving around on stretcher.. Eyes:   No scleral icterus. No conjunctival pallor. PERRL, Mydriatic at 6-7 mm bilaterally. EOMI.  No nystagmus. ENT   Head:   Normocephalic and atraumatic.   Nose:   No congestion/rhinnorhea. No septal hematoma   Mouth/Throat:   Dry mucous membranes, no pharyngeal erythema. No peritonsillar mass.    Neck:   No stridor. No SubQ emphysema. No meningismus. Hematological/Lymphatic/Immunilogical:   No cervical lymphadenopathy. Cardiovascular:   Tachycardia heart rate 120. Symmetric bilateral radial and DP pulses.  No murmurs.  Respiratory:   Normal respiratory effort without tachypnea nor retractions. Breath sounds are clear and equal bilaterally. No wheezes/rales/rhonchi. Gastrointestinal:   Soft with generalized tenderness and guarding. Non distended. There is no CVA tenderness.  No rebound or rigidity. Genitourinary:   deferred Musculoskeletal:   Nontender with normal range of motion in all extremities. No joint effusions.  No lower extremity tenderness.  No edema. Neurologic:   Normal speech and language.  CN 2-10 normal. Motor grossly intact. No gross focal neurologic deficits are appreciated.  Skin:    Skin is warm, dry and intact. No rash noted.  No petechiae, purpura, or bullae.  ____________________________________________    LABS (pertinent positives/negatives) (all labs ordered are listed, but only abnormal results are displayed) Labs Reviewed  CBC WITH DIFFERENTIAL/PLATELET - Abnormal; Notable for the following:    WBC 21.0 (*)    Hemoglobin 11.5 (*)    MCHC 31.9 (*)    RDW 15.0 (*)    Platelets 532 (*)    Neutro Abs 19.2 (*)    Lymphs Abs 0.7 (*)    Monocytes Absolute 1.0 (*)    All other components within normal limits  COMPREHENSIVE METABOLIC PANEL - Abnormal; Notable for the following:    Chloride 113 (*)     CO2 8 (*)    Glucose, Bld 189 (*)    Creatinine, Ser 1.27 (*)    Total Protein 8.5 (*)    AST 58 (*)    GFR calc non Af Amer 51 (*)    GFR calc Af Amer 59 (*)    Anion gap 22 (*)    All other components within normal limits  LACTIC ACID, PLASMA - Abnormal; Notable for the following:    Lactic Acid, Venous 6.5 (*)    All other components within normal limits  ACETAMINOPHEN LEVEL - Abnormal; Notable for the following:    Acetaminophen (Tylenol), Serum <10 (*)    All other components within normal limits  CULTURE, BLOOD (ROUTINE X 2)  CULTURE, BLOOD (ROUTINE X 2)  HCG, QUANTITATIVE, PREGNANCY  LIPASE, BLOOD  ETHANOL  URINE DRUG SCREEN, QUALITATIVE ([facility] ONLY)  URINALYSIS COMPLETEWITH MICROSCOPIC ([facility] ONLY)  LACTIC ACID, PLASMA   ____________________________________________   EKG    ____________________________________________    RADIOLOGY  CT abdomen and pelvis unremarkable  ____________________________________________   PROCEDURES CRITICAL CARE Performed by: Joni Fears, Paras Kreider   Total critical care time: 35 minutes  Critical care time was exclusive of separately billable procedures and treating other patients.  Critical care was necessary to treat or prevent imminent or life-threatening deterioration.  Critical care was time spent personally by me on the following activities: development of treatment plan with patient and/or surrogate as well as nursing, discussions with consultants, evaluation of patient's response to treatment, examination of patient, obtaining history from patient or  surrogate, ordering and performing treatments and interventions, ordering and review of laboratory studies, ordering and review of radiographic studies, pulse oximetry and re-evaluation of patient's condition.   Peripheral IV insertion by physician Indication: Multiple failed attempts by nursing staff, need for IV access and/or blood samples for workup Performed under continuous  real-time ultrasound visualization Area cleaned with chlorhexidine. 20-gauge IV successfully placed in the left antecubital fossa. 1 attempt, no complications, EBL 0.   ____________________________________________   INITIAL IMPRESSION / ASSESSMENT AND PLAN / ED COURSE  Pertinent labs & imaging results that were available during my care of the patient were reviewed by me and considered in my medical decision making (see chart for details).  Patient presents with severe pain, reports that it is due to opioid withdrawal. Asian given clonidine and Zofran without relief. Then given Haldol which did help. She still had severe pain. We're able to replace another IV at that time as the patient was told that more calm, and labs started coming back revealing severe leukocytosis of 21,000 and an anion gap metabolic acidosis on the chemistry panel. Then gave her additional medications to calm her nerves and make her more comfortable, and obtained a CT scan which was unremarkable. Patient given 3 L IV fluids and empiric vancomycin and Zosyn.  ----------------------------------------- 11:04 PM on 10/07/2015 -----------------------------------------  Urinalysis shows positive ketones. Tylenol level negative, ethanol level negative. He appears to be starvation ketoacidosis, possibly sepsis from an occult source. No evidence of soft tissue infection, low suspicion for meningitis or encephalitis. We'll admit to hospitalist for further management. Tachycardia continues to worsen and is now 140, blood pressure stable 150/100. We'll treat for alcohol or benzodiazepine withdrawal syndrome in the patient denies given another clinical clues and lack of definitive diagnosis.      ____________________________________________   FINAL CLINICAL IMPRESSION(S) / ED DIAGNOSES  Final diagnoses:  Lactic acidosis  Generalized abdominal pain     Portions of this note were generated with dragon dictation software.  Dictation errors may occur despite best attempts at proofreading.   Carrie Mew, MD 10/07/15 2308

## 2015-10-07 NOTE — ED Notes (Signed)
MD at bedside to attempt ultrasound IV placement.

## 2015-10-07 NOTE — ED Notes (Signed)
Pt reports she is not able to give urine sample at this time and does not believe she will be able to due to pain. Pt also reports she is unable to drink contrast for CT due to abd pain and nausea. MD made aware.

## 2015-10-07 NOTE — ED Notes (Signed)
Pt reports taking too much of her vicodin over the past few months, now has ran out.  Having nausea, sweating and shaking, and yelling out.

## 2015-10-08 LAB — COMPREHENSIVE METABOLIC PANEL
ALBUMIN: 3.7 g/dL (ref 3.5–5.0)
ALT: 13 U/L — ABNORMAL LOW (ref 14–54)
ANION GAP: 7 (ref 5–15)
AST: 22 U/L (ref 15–41)
Alkaline Phosphatase: 57 U/L (ref 38–126)
BILIRUBIN TOTAL: 0.6 mg/dL (ref 0.3–1.2)
BUN: <5 mg/dL — ABNORMAL LOW (ref 6–20)
CO2: 18 mmol/L — AB (ref 22–32)
Calcium: 8.5 mg/dL — ABNORMAL LOW (ref 8.9–10.3)
Chloride: 117 mmol/L — ABNORMAL HIGH (ref 101–111)
Creatinine, Ser: 0.74 mg/dL (ref 0.44–1.00)
GFR calc Af Amer: >60 mL/min (ref >60)
GFR calc non Af Amer: >60 mL/min (ref >60)
GLUCOSE: 116 mg/dL — AB (ref 65–99)
POTASSIUM: 3.7 mmol/L (ref 3.5–5.1)
SODIUM: 142 mmol/L (ref 135–145)
TOTAL PROTEIN: 7.2 g/dL (ref 6.5–8.1)

## 2015-10-08 LAB — CBC
HEMATOCRIT: 30.7 % — AB (ref 35.0–47.0)
HEMOGLOBIN: 10.3 g/dL — AB (ref 12.0–16.0)
MCH: 27.8 pg (ref 26.0–34.0)
MCHC: 33.6 g/dL (ref 32.0–36.0)
MCV: 82.7 fL (ref 80.0–100.0)
Platelets: 343 10*3/uL (ref 150–440)
RBC: 3.72 MIL/uL — ABNORMAL LOW (ref 3.80–5.20)
RDW: 14.8 % — ABNORMAL HIGH (ref 11.5–14.5)
WBC: 16.5 10*3/uL — ABNORMAL HIGH (ref 3.6–11.0)

## 2015-10-08 LAB — BRAIN NATRIURETIC PEPTIDE: B Natriuretic Peptide: 225 pg/mL — ABNORMAL HIGH (ref 0.0–100.0)

## 2015-10-08 LAB — LACTIC ACID, PLASMA: LACTIC ACID, VENOUS: 4 mmol/L — AB (ref 0.5–2.0)

## 2015-10-08 LAB — PROTIME-INR
INR: 1.3
Prothrombin Time: 16.3 seconds — ABNORMAL HIGH (ref 11.4–15.0)

## 2015-10-08 LAB — TROPONIN I
TROPONIN I: 0.03 ng/mL (ref <0.031)
TROPONIN I: 0.04 ng/mL — AB (ref <0.031)
Troponin I: 0.04 ng/mL — ABNORMAL HIGH (ref <0.031)

## 2015-10-08 LAB — TSH: TSH: 1.922 u[IU]/mL (ref 0.350–4.500)

## 2015-10-08 LAB — APTT: aPTT: 32 seconds (ref 24–36)

## 2015-10-08 LAB — SURGICAL PCR SCREEN
MRSA, PCR: NEGATIVE
Staphylococcus aureus: POSITIVE — AB

## 2015-10-08 MED ORDER — DEXTROSE 5 % IV SOLN
1.0000 g | INTRAVENOUS | Status: DC
  Administered 2015-10-09 – 2015-10-10 (×2): 1 g via INTRAVENOUS
  Filled 2015-10-08 (×2): qty 10

## 2015-10-08 MED ORDER — AMLODIPINE BESYLATE 5 MG PO TABS
ORAL_TABLET | ORAL | Status: AC
  Filled 2015-10-08: qty 2

## 2015-10-08 MED ORDER — DEXMEDETOMIDINE HCL IN NACL 400 MCG/100ML IV SOLN
0.4000 ug/kg/h | INTRAVENOUS | Status: DC
  Administered 2015-10-08 (×2): 0.4 ug/kg/h via INTRAVENOUS
  Filled 2015-10-08 (×2): qty 100

## 2015-10-08 MED ORDER — HYDRALAZINE HCL 20 MG/ML IJ SOLN
10.0000 mg | INTRAMUSCULAR | Status: DC | PRN
  Administered 2015-10-08 – 2015-10-09 (×2): 10 mg via INTRAVENOUS
  Filled 2015-10-08 (×2): qty 1

## 2015-10-08 MED ORDER — CLONIDINE HCL 0.1 MG PO TABS
ORAL_TABLET | ORAL | Status: AC
  Filled 2015-10-08: qty 1

## 2015-10-08 MED ORDER — HYDRALAZINE HCL 20 MG/ML IJ SOLN
INTRAMUSCULAR | Status: AC
  Administered 2015-10-09: 10 mg via INTRAVENOUS
  Filled 2015-10-08: qty 1

## 2015-10-08 MED ORDER — SODIUM CHLORIDE 0.45 % IV SOLN
INTRAVENOUS | Status: DC
  Administered 2015-10-08 – 2015-10-09 (×4): via INTRAVENOUS
  Filled 2015-10-08 (×5): qty 1000

## 2015-10-08 MED ORDER — CLONIDINE HCL 0.1 MG PO TABS
0.1000 mg | ORAL_TABLET | Freq: Two times a day (BID) | ORAL | Status: DC
  Administered 2015-10-08 – 2015-10-10 (×6): 0.1 mg via ORAL
  Filled 2015-10-08 (×5): qty 1

## 2015-10-08 MED ORDER — METOCLOPRAMIDE HCL 5 MG/ML IJ SOLN
INTRAMUSCULAR | Status: AC
  Administered 2015-10-08: 5 mg via INTRAVENOUS
  Filled 2015-10-08: qty 2

## 2015-10-08 MED ORDER — DIAZEPAM 2 MG PO TABS
ORAL_TABLET | ORAL | Status: AC
  Administered 2015-10-08: 2 mg via ORAL
  Filled 2015-10-08: qty 1

## 2015-10-08 MED ORDER — DEXTROSE 5 % IV SOLN
1.0000 g | Freq: Once | INTRAVENOUS | Status: AC
  Administered 2015-10-08: 1 g via INTRAVENOUS
  Filled 2015-10-08: qty 10

## 2015-10-08 MED ORDER — SODIUM CHLORIDE 0.9 % IV BOLUS (SEPSIS)
1000.0000 mL | Freq: Once | INTRAVENOUS | Status: AC
  Administered 2015-10-08: 1000 mL via INTRAVENOUS

## 2015-10-08 NOTE — Consult Note (Signed)
See complete note by Tammi Klippel, PA.  Pt denies signif abd pain at this time, mild pain suprapubic discomfort.  No specific GI intervention needed.  Pt should be discharged on PPI and possibly carafate due to possible gastritis from vomiting.  Pt to follow up with usual doctors.

## 2015-10-08 NOTE — Progress Notes (Signed)
Notified Dr. Marcille Blanco of pts HR 130-140's and troponin of 0.04. No new orders received, will continue to reassess.

## 2015-10-08 NOTE — Progress Notes (Signed)
[place] [facility] [place] NAME: [name]    MRN#:  [id number]  DATE OF BIRTH:  July 13, 1970  SUBJECTIVE:  Hospital Day: 1 day [name] is a 45 y.o. female presenting with Withdrawal .   Overnight events: Tachycardic overnight Interval Events: Improved vital signs, improved mental status no complaints at this time  REVIEW OF SYSTEMS:  CONSTITUTIONAL: No fever, fatigue or weakness.  EYES: No blurred or double vision.  EARS, NOSE, AND THROAT: No tinnitus or ear pain.  RESPIRATORY: No cough, shortness of breath, wheezing or hemoptysis.  CARDIOVASCULAR: No chest pain, orthopnea, edema.  GASTROINTESTINAL: No nausea, vomiting, diarrhea or abdominal pain.  GENITOURINARY: No dysuria, hematuria.  ENDOCRINE: No polyuria, nocturia,  HEMATOLOGY: No anemia, easy bruising or bleeding SKIN: No rash or lesion. MUSCULOSKELETAL: No joint pain or arthritis.   NEUROLOGIC: No tingling, numbness, weakness.  PSYCHIATRY: No anxiety or depression.   DRUG ALLERGIES:   Allergies  Allergen Reactions  . Lactose Intolerance (Gi) Nausea Only    VITALS:  Blood pressure 142/85, pulse 118, temperature 98.7 F (37.1 C), temperature source Oral, resp. rate 21, height 5\' 6"  (1.676 m), weight 210 lb (95.255 kg), SpO2 97 %.  PHYSICAL EXAMINATION:  VITAL SIGNS: Filed Vitals:   10/08/15 1100 10/08/15 1200  BP: 146/86 142/85  Pulse: 107 118  Temp:    Resp: 21 21   GENERAL:44 y.o.female currently in no acute distress.  HEAD: Normocephalic, atraumatic.  EYES: Pupils equal, round, reactive to light. Extraocular muscles intact. No scleral icterus.  MOUTH: Moist mucosal membrane. Dentition intact. No abscess noted.  EAR, NOSE, THROAT: Clear without exudates. No external lesions.  NECK: Supple. No thyromegaly. No nodules. No JVD.  PULMONARY: Clear to ascultation, without wheeze rails or rhonci. No use of accessory muscles, Good respiratory effort. good air  entry bilaterally CHEST: Nontender to palpation.  CARDIOVASCULAR: S1 and S2.Tachycardic No murmurs, rubs, or gallops. No edema. Pedal pulses 2+ bilaterally.  GASTROINTESTINAL: Soft, nontender, nondistended. No masses. Positive bowel sounds. No hepatosplenomegaly.  MUSCULOSKELETAL: No swelling, clubbing, or edema. Range of motion full in all extremities.  NEUROLOGIC: Cranial nerves II through XII are intact. No gross focal neurological deficits. Sensation intact. Reflexes intact.  SKIN: No ulceration, lesions, rashes, or cyanosis. Skin warm and dry. Turgor intact.  PSYCHIATRIC: Mood, affect within normal limits. The patient is awake, alert and oriented x 3. Insight, judgment intact.      LABORATORY PANEL:   CBC  Recent Labs Lab 10/08/15 0439  WBC 16.5*  HGB 10.3*  HCT 30.7*  PLT 343   ------------------------------------------------------------------------------------------------------------------  Chemistries   Recent Labs Lab 10/08/15 0439  NA 142  K 3.7  CL 117*  CO2 18*  GLUCOSE 116*  BUN <5*  CREATININE 0.74  CALCIUM 8.5*  AST 22  ALT 13*  ALKPHOS 57  BILITOT 0.6   ------------------------------------------------------------------------------------------------------------------  Cardiac Enzymes  Recent Labs Lab 10/08/15 0710  TROPONINI 0.04*   ------------------------------------------------------------------------------------------------------------------  RADIOLOGY:  Ct Abdomen Pelvis W Contrast  10/07/2015  CLINICAL DATA:  Nausea vomiting and sledding. EXAM: CT ABDOMEN AND PELVIS WITH CONTRAST TECHNIQUE: Multidetector CT imaging of the abdomen and pelvis was performed using the standard protocol following bolus administration of intravenous contrast. CONTRAST:  157mL ISOVUE-300 IOPAMIDOL (ISOVUE-300) INJECTION 61% COMPARISON:  Abdominal radiograph 09/18/2015 FINDINGS: Lower chest:  No acute findings. Hepatobiliary: No masses or other significant  abnormality. Pancreas: No mass, inflammatory changes, or other significant abnormality. Spleen: Within normal limits in size  and appearance. Adrenals/Urinary Tract: No masses identified. No evidence of hydronephrosis. Stomach/Bowel: No evidence of obstruction, inflammatory process, or abnormal fluid collections. The appendix is normal. Vascular/Lymphatic: No pathologically enlarged lymph nodes. No evidence of abdominal aortic aneurysm. Reproductive: No mass or other significant abnormality. Other: None. Musculoskeletal:  No suspicious bone lesions identified. IMPRESSION: No evidence of acute abnormalities within the abdomen or pelvis. Electronically Signed   By: Fidela Salisbury M.D.   On: 10/07/2015 22:31   Dg Chest Portable 1 View  10/07/2015  CLINICAL DATA:  Nausea, sweating and shaking. EXAM: PORTABLE CHEST 1 VIEW COMPARISON:  09/23/2015 FINDINGS: The heart size and mediastinal contours are within normal limits. Both lungs are clear. The visualized skeletal structures are unremarkable. IMPRESSION: No active disease. Electronically Signed   By: Fidela Salisbury M.D.   On: 10/07/2015 23:23    EKG:   Orders placed or performed during the hospital encounter of 01/10/15  . ED EKG  . ED EKG  . EKG 12-Lead  . EKG 12-Lead  . EKG    ASSESSMENT AND PLAN:   [name] is a 45 y.o. female presenting with Withdrawal . Admitted 10/07/2015 : Day #: 1 day 1. Opioid withdrawal. It seems patient has issues with opioid abuse took 1 month prescription in under 2 weeks, patient's husband states this is a common occurrence, consult psychiatry, consult social work 2. Urinary tract infection site unspecified: Mildly positive UA with dysuria ceftriaxone,: Urine culture 3. Essential hypertension amlodipine, lisinopril 4. Anxiety depression not otherwise specified. duloxetine , BuSpar, Valium 5chronic abdominal pain Bentyl 20 mg 3 times a day 6. GERD without esophagitis PPI    All the records are  reviewed and case discussed with Care Management/Social Workerr. Management plans discussed with the patient, family and they are in agreement.  CODE STATUS: full TOTAL TIME TAKING CARE OF THIS PATIENT: 28 minutes.   POSSIBLE D/C IN 1-2DAYS, DEPENDING ON CLINICAL CONDITION.   [name],  Karenann Cai.D on 10/08/2015 at 12:11 PM  Between 7am to 6pm - Pager - [phone]  After 6pm: House Pager: - [facility]  Office  [phone]  CC: Primary care physician; Leonard Downing, MD
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
## 2015-10-09 LAB — URINE CULTURE: CULTURE: NO GROWTH
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
## 2015-10-09 NOTE — Progress Notes (Signed)
MD notified to assess patient left shoulder this shift. Gauze and transparent dressing
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
## 2015-10-09 NOTE — Consult Note (Signed)
I will sign off, reconsult if needed.

## 2015-10-09 NOTE — Progress Notes (Signed)
Patient without complaint, this writer noticed IV to left shoulder red with a blister just beyond transparent dressing. Patient denies latex or tape allergies. Fluids reconnected to left ac site. Will continue to monitor.

## 2015-10-09 NOTE — Progress Notes (Signed)
[name] NAME: [name]    MRN#:  [id number]  DATE OF BIRTH:  1971/04/12  SUBJECTIVE:  Hospital Day: 2 days [name] is a 45 y.o. female presenting with Withdrawal .   Overnight events: No overnight events Interval Events: Improved vital signs, improved mental status no complaints at this time  REVIEW OF SYSTEMS:  CONSTITUTIONAL: No fever, fatigue or weakness.  EYES: No blurred or double vision.  EARS, NOSE, AND THROAT: No tinnitus or ear pain.  RESPIRATORY: No cough, shortness of breath, wheezing or hemoptysis.  CARDIOVASCULAR: No chest pain, orthopnea, edema.  GASTROINTESTINAL: No nausea, vomiting, diarrhea or abdominal pain.  GENITOURINARY: No dysuria, hematuria.  ENDOCRINE: No polyuria, nocturia,  HEMATOLOGY: No anemia, easy bruising or bleeding SKIN: No rash or lesion. MUSCULOSKELETAL: No joint pain or arthritis.   NEUROLOGIC: No tingling, numbness, weakness.  PSYCHIATRY: No anxiety or depression.   DRUG ALLERGIES:   Allergies  Allergen Reactions  . Lactose Intolerance (Gi) Nausea Only    VITALS:  Blood pressure 150/89, pulse 103, temperature 98.2 F (36.8 C), temperature source Oral, resp. rate 18, height 5\' 6"  (1.676 m), weight 210 lb (95.255 kg), SpO2 98 %.  PHYSICAL EXAMINATION:  VITAL SIGNS: Filed Vitals:   10/09/15 0758 10/09/15 1145  BP: 153/105 150/89  Pulse: 100 103  Temp: 98.8 F (37.1 C) 98.2 F (36.8 C)  Resp: 14 18   GENERAL:44 y.o.female currently in no acute distress.  HEAD: Normocephalic, atraumatic.  EYES: Pupils equal, round, reactive to light. Extraocular muscles intact. No scleral icterus.  MOUTH: Moist mucosal membrane. Dentition intact. No abscess noted.  EAR, NOSE, THROAT: Clear without exudates. No external lesions.  NECK: Supple. No thyromegaly. No nodules. No JVD.  PULMONARY: Clear to ascultation, without wheeze rails or rhonci. No use of accessory muscles,  Good respiratory effort. good air entry bilaterally CHEST: Nontender to palpation.  CARDIOVASCULAR: S1 and S2.Tachycardic No murmurs, rubs, or gallops. No edema. Pedal pulses 2+ bilaterally.  GASTROINTESTINAL: Soft, nontender, nondistended. No masses. Positive bowel sounds. No hepatosplenomegaly.  MUSCULOSKELETAL: No swelling, clubbing, or edema. Range of motion full in all extremities.  NEUROLOGIC: Cranial nerves II through XII are intact. No gross focal neurological deficits. Sensation intact. Reflexes intact.  SKIN: No ulceration, lesions, rashes, or cyanosis. Skin warm and dry. Turgor intact.  PSYCHIATRIC: Mood, affect within normal limits. The patient is awake, alert and oriented x 3. Insight, judgment intact.      LABORATORY PANEL:   CBC  Recent Labs Lab 10/08/15 0439  WBC 16.5*  HGB 10.3*  HCT 30.7*  PLT 343   ------------------------------------------------------------------------------------------------------------------  Chemistries   Recent Labs Lab 10/08/15 0439  NA 142  K 3.7  CL 117*  CO2 18*  GLUCOSE 116*  BUN <5*  CREATININE 0.74  CALCIUM 8.5*  AST 22  ALT 13*  ALKPHOS 57  BILITOT 0.6   ------------------------------------------------------------------------------------------------------------------  Cardiac Enzymes  Recent Labs Lab 10/08/15 0710  TROPONINI 0.04*   ------------------------------------------------------------------------------------------------------------------  RADIOLOGY:  Ct Abdomen Pelvis W Contrast  10/07/2015  CLINICAL DATA:  Nausea vomiting and sledding. EXAM: CT ABDOMEN AND PELVIS WITH CONTRAST TECHNIQUE: Multidetector CT imaging of the abdomen and pelvis was performed using the standard protocol following bolus administration of intravenous contrast. CONTRAST:  127mL ISOVUE-300 IOPAMIDOL (ISOVUE-300) INJECTION 61% COMPARISON:  Abdominal radiograph 09/18/2015 FINDINGS: Lower chest:  No acute findings. Hepatobiliary: No  masses or other significant abnormality. Pancreas: No mass, inflammatory changes, or other significant  abnormality. Spleen: Within normal limits in size and appearance. Adrenals/Urinary Tract: No masses identified. No evidence of hydronephrosis. Stomach/Bowel: No evidence of obstruction, inflammatory process, or abnormal fluid collections. The appendix is normal. Vascular/Lymphatic: No pathologically enlarged lymph nodes. No evidence of abdominal aortic aneurysm. Reproductive: No mass or other significant abnormality. Other: None. Musculoskeletal:  No suspicious bone lesions identified. IMPRESSION: No evidence of acute abnormalities within the abdomen or pelvis. Electronically Signed   By: Fidela Salisbury M.D.   On: 10/07/2015 22:31   Dg Chest Portable 1 View  10/07/2015  CLINICAL DATA:  Nausea, sweating and shaking. EXAM: PORTABLE CHEST 1 VIEW COMPARISON:  09/23/2015 FINDINGS: The heart size and mediastinal contours are within normal limits. Both lungs are clear. The visualized skeletal structures are unremarkable. IMPRESSION: No active disease. Electronically Signed   By: Fidela Salisbury M.D.   On: 10/07/2015 23:23    EKG:   Orders placed or performed during the hospital encounter of 01/10/15  . ED EKG  . ED EKG  . EKG 12-Lead  . EKG 12-Lead  . EKG    ASSESSMENT AND PLAN:   [name] is a 45 y.o. female presenting with Withdrawal . Admitted 10/07/2015 : Day #: 2 days 1. Opioid withdrawal. It seems patient has issues with opioid abuse took 1 month prescription in under 2 weeks, patient's husband states this is a common occurrence, consult psychiatry, consult social work 2. Urinary tract infection site unspecified: Mildly positive UA with dysuria ceftriaxone,: Urine culture 3. Essential hypertension amlodipine, lisinopril 4. Anxiety depression not otherwise specified. duloxetine , BuSpar, Valium 5. chronic abdominal pain Bentyl 20 mg 3 times a day 6. GERD without esophagitis  PPI    All the records are reviewed and case discussed with Care Management/Social Workerr. Management plans discussed with the patient, family and they are in agreement.  CODE STATUS: full TOTAL TIME TAKING CARE OF THIS PATIENT: 28 minutes.   POSSIBLE D/C IN 1-2DAYS, DEPENDING ON CLINICAL CONDITION.   Hower,  Tracy Cai.D on 10/09/2015 at 11:47 AM  Between 7am to 6pm - Pager - [phone]  After 6pm: House Pager: - [phone]  [facility]  Office  [phone]  CC: Primary care physician; Leonard Downing, MD

## 2015-10-09 NOTE — Progress Notes (Signed)
Spoke with Dr. Nadara Mustard on 6/17   Base on our discussion the consult was cancelled.  Pt needs SW consult  to see her and set up f//u for substance abuse.
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
## 2015-10-10 LAB — BLOOD CULTURE ID PANEL (REFLEXED)
ACINETOBACTER BAUMANNII: NOT DETECTED
CANDIDA KRUSEI: NOT DETECTED
CANDIDA TROPICALIS: NOT DETECTED
CARBAPENEM RESISTANCE: NOT DETECTED
Candida albicans: NOT DETECTED
Candida glabrata: NOT DETECTED
Candida parapsilosis: NOT DETECTED
ENTEROBACTERIACEAE SPECIES: NOT DETECTED
Enterobacter cloacae complex: NOT DETECTED
Enterococcus species: NOT DETECTED
Escherichia coli: NOT DETECTED
HAEMOPHILUS INFLUENZAE: NOT DETECTED
Klebsiella oxytoca: NOT DETECTED
Klebsiella pneumoniae: NOT DETECTED
LISTERIA MONOCYTOGENES: NOT DETECTED
METHICILLIN RESISTANCE: DETECTED — AB
NEISSERIA MENINGITIDIS: NOT DETECTED
Proteus species: NOT DETECTED
Pseudomonas aeruginosa: NOT DETECTED
SERRATIA MARCESCENS: NOT DETECTED
STAPHYLOCOCCUS SPECIES: DETECTED — AB
STREPTOCOCCUS PYOGENES: NOT DETECTED
STREPTOCOCCUS SPECIES: NOT DETECTED
Staphylococcus aureus (BCID): NOT DETECTED
Streptococcus agalactiae: NOT DETECTED
Streptococcus pneumoniae: NOT DETECTED
VANCOMYCIN RESISTANCE: NOT DETECTED

## 2015-10-10 LAB — CBC
HCT: 30.1 % — ABNORMAL LOW (ref 35.0–47.0)
HEMOGLOBIN: 10 g/dL — AB (ref 12.0–16.0)
MCH: 28.1 pg (ref 26.0–34.0)
MCHC: 33.1 g/dL (ref 32.0–36.0)
MCV: 85 fL (ref 80.0–100.0)
PLATELETS: 301 10*3/uL (ref 150–440)
RBC: 3.54 MIL/uL — AB (ref 3.80–5.20)
RDW: 15.4 % — ABNORMAL HIGH (ref 11.5–14.5)
WBC: 9 10*3/uL (ref 3.6–11.0)

## 2015-10-10 LAB — CREATININE, SERUM
CREATININE: 0.74 mg/dL (ref 0.44–1.00)
GFR calc Af Amer: >60 mL/min (ref >60)

## 2015-10-10 LAB — GLUCOSE, CAPILLARY: GLUCOSE-CAPILLARY: 170 mg/dL — AB (ref 65–99)

## 2015-10-10 MED ORDER — DICYCLOMINE HCL 10 MG PO CAPS: 20.0000 mg | ORAL_CAPSULE | Freq: Three times a day (TID) | ORAL | Status: DC

## 2015-10-10 MED ORDER — VANCOMYCIN HCL IN DEXTROSE 1-5 GM/200ML-% IV SOLN
1000.0000 mg | Freq: Three times a day (TID) | INTRAVENOUS | Status: DC
  Administered 2015-10-10: 1000 mg via INTRAVENOUS
  Filled 2015-10-10 (×3): qty 200

## 2015-10-10 MED ORDER — VANCOMYCIN HCL IN DEXTROSE 1-5 GM/200ML-% IV SOLN
1000.0000 mg | Freq: Once | INTRAVENOUS | Status: AC
  Administered 2015-10-10: 1000 mg via INTRAVENOUS
  Filled 2015-10-10: qty 200

## 2015-10-10 MED ORDER — HYDROCODONE-ACETAMINOPHEN 10-325 MG PO TABS: 1.0000 | ORAL_TABLET | Freq: Three times a day (TID) | ORAL | Status: DC | PRN

## 2015-10-10 MED ORDER — BUSPIRONE HCL 7.5 MG PO TABS: 7.5000 mg | ORAL_TABLET | Freq: Three times a day (TID) | ORAL | Status: DC

## 2015-10-10 MED ORDER — AMLODIPINE BESYLATE 10 MG PO TABS: 10.0000 mg | ORAL_TABLET | Freq: Every day | ORAL | Status: DC
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
## 2015-10-10 NOTE — Clinical Documentation Improvement (Signed)
Internal Medicine  Based on the clinical findings below, please document any associated diagnoses/conditions the patient has or may have.   MRSA Sepsis rules in and was present or evolving on admission  Sepsis ruled out  Other  Clinically Undetermined  Supporting Information:  Lactates were: 6.5, repeat 4.0  WBC was 21 on admission  Heart rate > 100; Respiratory Rates > 22  Positive blood cultures - MRSA  IV Vancomycin was given in ED, held and then restarted q8h; also on IV Rocephin for possible UTI   Please exercise your independent, professional judgment when responding. A specific answer is not anticipated or expected.  Thank You, Zoila Shutter RN, BSN, [place] [phone]; Cell: [phone]

## 2015-10-10 NOTE — Progress Notes (Signed)
DISCHARGE NOTE:  Pt given discharge instructions and prescriptions. Pt verbalized undestanding. Pt wheeled to car.

## 2015-10-10 NOTE — Discharge Instructions (Signed)
° °  When she is discharged from hospital she will follow up with family care doctor and request to see a back surgeon and will consider having surgery now that she has Medicaid She will connect with [facility] and attend groups and inquire about pain management clinic.

## 2015-10-10 NOTE — Progress Notes (Signed)
Pts BP 162/90. Scheduled BP meds given. MD Hower notified. No new orders at this time.

## 2015-10-10 NOTE — Progress Notes (Signed)
   [address], [place] [address]  October 10, 2015  Patient:  Brycelynn Empey Date of Birth: 31-May-1970 Date of Visit:  10/07/2015  To Whom it May Concern:  Please excuse SAFIRE BOLASH from work from 10/07/2015 until 10/10/2015 as she was admitted to the [facility] for medical treatment and has been receiving appropriate care. She may return to work on 10/12/15 sooner if she feels she is able to return sooner than this date.      Please don't hesitate to contact me with questions or concerns by calling  [phone] and asking them to page me directly.   Regino Schultze, MD

## 2015-10-10 NOTE — Progress Notes (Signed)
Blood cultures from 16th of June came back positive for MRSA.  Discussed with pharmacist will start vancomycin for now.

## 2015-10-10 NOTE — Progress Notes (Addendum)
Pharmacy Antibiotic Note  [name] is a 45 y.o. female admitted on 10/07/2015 with bacteremia.  Pharmacy has been consulted for vancomycin dosing.  Plan: kei 0.091 hr-1  T1/2 8 hours DW 74kg  Vd 52L Vancomycin 1 gram q 8 hours ordered with stacked dosing. Level before 5th dose. Goal trough 15-20.   Height: 5\' 6"  (167.6 cm) Weight: 210 lb (95.255 kg) IBW/kg (Calculated) : 59.3  Temp (24hrs), Avg:98.8 F (37.1 C), Min:98.2 F (36.8 C), Max:99.5 F (37.5 C)   Recent Labs Lab 10/07/15 1930 10/07/15 2137 10/08/15 0004 10/08/15 0439  WBC 21.0*  --   --  16.5*  CREATININE 1.27*  --   --  0.74  LATICACIDVEN  --  6.5* 4.0*  --     Estimated Creatinine Clearance: 104.4 mL/min (by C-G formula based on Cr of 0.74).    Allergies  Allergen Reactions  . Lactose Intolerance (Gi) Nausea Only    Antimicrobials this admission: ceftriaxone  >>  vancomycin  >>   Dose adjustments this admission:   Microbiology results: 6/16 BCx: BCID Staph spp. MecA(+). Start vancomycin per conversation with hospitalist. 6/16 UCx: pending  6/17 MRSA PCR: (+)  Thank you for allowing pharmacy to be a part of this patient's care.  [name] S 10/10/2015 1:11 AM

## 2015-10-11 LAB — H PYLORI, IGM, IGG, IGA AB
H Pylori IgG: <0.9 U/mL (ref 0.0–0.8)
H. Pylogi, Iga Abs: <9 units (ref 0.0–8.9)

## 2015-10-12 LAB — CULTURE, BLOOD (ROUTINE X 2)

## 2015-10-14 LAB — CULTURE, BLOOD (ROUTINE X 2): CULTURE: NO GROWTH

## 2015-10-17 ENCOUNTER — Emergency Department
Admission: EM | Admit: 2015-10-17 | Discharge: 2015-10-17 | Disposition: A | Discharge status: Home / Self Care | Payer: Medicaid Other | Attending: Emergency Medicine | Admitting: Emergency Medicine

## 2015-10-17 ENCOUNTER — Other Ambulatory Visit: Payer: Self-pay

## 2015-10-17 DIAGNOSIS — Z85828 Personal history of other malignant neoplasm of skin: Secondary | ICD-10-CM | POA: Diagnosis not present

## 2015-10-17 DIAGNOSIS — E860 Dehydration: Secondary | ICD-10-CM | POA: Diagnosis not present

## 2015-10-17 DIAGNOSIS — F329 Major depressive disorder, single episode, unspecified: Secondary | ICD-10-CM | POA: Diagnosis not present

## 2015-10-17 DIAGNOSIS — E785 Hyperlipidemia, unspecified: Secondary | ICD-10-CM | POA: Diagnosis not present

## 2015-10-17 DIAGNOSIS — M1990 Unspecified osteoarthritis, unspecified site: Secondary | ICD-10-CM | POA: Insufficient documentation

## 2015-10-17 DIAGNOSIS — Z79899 Other long term (current) drug therapy: Secondary | ICD-10-CM | POA: Diagnosis not present

## 2015-10-17 DIAGNOSIS — R1084 Generalized abdominal pain: Secondary | ICD-10-CM

## 2015-10-17 DIAGNOSIS — K2970 Gastritis, unspecified, without bleeding: Secondary | ICD-10-CM | POA: Insufficient documentation

## 2015-10-17 LAB — COMPREHENSIVE METABOLIC PANEL
ALT: 12 U/L — ABNORMAL LOW (ref 14–54)
AST: 20 U/L (ref 15–41)
Albumin: 4.6 g/dL (ref 3.5–5.0)
Alkaline Phosphatase: 65 U/L (ref 38–126)
Anion gap: 9 (ref 5–15)
BUN: 8 mg/dL (ref 6–20)
CHLORIDE: 102 mmol/L (ref 101–111)
CO2: 27 mmol/L (ref 22–32)
CREATININE: 0.66 mg/dL (ref 0.44–1.00)
Calcium: 9.6 mg/dL (ref 8.9–10.3)
GFR calc Af Amer: >60 mL/min (ref >60)
GLUCOSE: 114 mg/dL — AB (ref 65–99)
Potassium: 3 mmol/L — ABNORMAL LOW (ref 3.5–5.1)
Sodium: 138 mmol/L (ref 135–145)
TOTAL PROTEIN: 8.4 g/dL — AB (ref 6.5–8.1)

## 2015-10-17 LAB — CBC
HEMATOCRIT: 35 % (ref 35.0–47.0)
Hemoglobin: 11.8 g/dL — ABNORMAL LOW (ref 12.0–16.0)
MCH: 28.8 pg (ref 26.0–34.0)
MCHC: 33.8 g/dL (ref 32.0–36.0)
MCV: 85.3 fL (ref 80.0–100.0)
PLATELETS: 331 10*3/uL (ref 150–440)
RBC: 4.11 MIL/uL (ref 3.80–5.20)
RDW: 15.5 % — AB (ref 11.5–14.5)
WBC: 9 10*3/uL (ref 3.6–11.0)

## 2015-10-17 LAB — URINALYSIS COMPLETE WITH MICROSCOPIC (ARMC ONLY)
BACTERIA UA: NONE SEEN
BILIRUBIN URINE: NEGATIVE
GLUCOSE, UA: NEGATIVE mg/dL
Hgb urine dipstick: NEGATIVE
KETONES UR: NEGATIVE mg/dL
LEUKOCYTES UA: NEGATIVE
Nitrite: NEGATIVE
PH: 6 (ref 5.0–8.0)
Protein, ur: NEGATIVE mg/dL
RBC / HPF: NONE SEEN RBC/hpf (ref 0–5)
SQUAMOUS EPITHELIAL / LPF: NONE SEEN
Specific Gravity, Urine: 1.016 (ref 1.005–1.030)
WBC, UA: NONE SEEN WBC/hpf (ref 0–5)

## 2015-10-17 LAB — POCT PREGNANCY, URINE: PREG TEST UR: NEGATIVE

## 2015-10-17 LAB — TROPONIN I: Troponin I: <0.03 ng/mL (ref <0.031)

## 2015-10-17 LAB — LIPASE, BLOOD: LIPASE: 20 U/L (ref 11–51)

## 2015-10-17 MED ORDER — FAMOTIDINE 20 MG PO TABS
40.0000 mg | ORAL_TABLET | Freq: Once | ORAL | Status: AC
  Administered 2015-10-17: 40 mg via ORAL
  Filled 2015-10-17: qty 2

## 2015-10-17 MED ORDER — LOPERAMIDE HCL 2 MG PO TABS: 4.0000 mg | ORAL_TABLET | Freq: Four times a day (QID) | ORAL | Status: DC | PRN

## 2015-10-17 MED ORDER — DEXTROSE 5 % AND 0.9 % NACL IV BOLUS
1000.0000 mL | Freq: Once | INTRAVENOUS | Status: AC
  Administered 2015-10-17: 1000 mL via INTRAVENOUS

## 2015-10-17 MED ORDER — ONDANSETRON 4 MG PO TBDP: 4.0000 mg | ORAL_TABLET | Freq: Three times a day (TID) | ORAL | Status: DC | PRN

## 2015-10-17 MED ORDER — PENTAFLUOROPROP-TETRAFLUOROETH EX AERO
INHALATION_SPRAY | CUTANEOUS | Status: AC
  Filled 2015-10-17: qty 30

## 2015-10-17 MED ORDER — LOPERAMIDE HCL 2 MG PO CAPS
4.0000 mg | ORAL_CAPSULE | Freq: Once | ORAL | Status: AC
  Administered 2015-10-17: 4 mg via ORAL
  Filled 2015-10-17: qty 2

## 2015-10-17 MED ORDER — FAMOTIDINE 20 MG PO TABS: 20.0000 mg | ORAL_TABLET | Freq: Two times a day (BID) | ORAL | Status: DC

## 2015-10-17 MED ORDER — GI COCKTAIL ~~LOC~~
30.0000 mL | ORAL | Status: AC
  Administered 2015-10-17: 30 mL via ORAL
  Filled 2015-10-17: qty 30

## 2015-10-17 NOTE — ED Notes (Signed)
Pt states that she has been having abd pain for the past few months, pt states that her abd is bloated and is tender throughout, pt denies any diagnosis hx

## 2015-10-17 NOTE — ED Notes (Signed)
Pt is tolerating oral intake without any difficulty.[name]

## 2015-10-17 NOTE — ED Provider Notes (Signed)
[facility] Emergency Department Provider Note  ____________________________________________  Time seen: 4:30 AM  I have reviewed the triage vital signs and the nursing notes.   HISTORY  Chief Complaint Abdominal Pain    HPI [name] is a 45 y.o. female complains of generalized abdominal pain for the past 2 days associated with nausea but no vomiting. She is having some diarrhea as well. No chest pain shortness breath fever chills or sweats. Pain is achy moderate intensity. No aggravating or alleviating factors. Denies dysuria.     Past Medical History  Diagnosis Date  . PTSD (post-traumatic stress disorder)   . Bulging lumbar disc   . Depression   . Hypertension   . Hyperlipemia   . Headache     "weekly" (10/15/2014)  . DDD (degenerative disc disease), cervical   . Arthritis     "joints ache all over" (10/15/2014)  . Chronic lower back pain   . Anxiety   . Skin cancer     "had them cut off my arms; don't know what kind"  . Drug-seeking behavior      Patient Active Problem List   Diagnosis Date Noted  . Hypomagnesemia   . AKI (acute kidney injury) ([place])   . C. difficile colitis   . SIRS (systemic inflammatory response syndrome) ([place]) 01/11/2015  . Cellulitis 01/03/2015  . Tachycardia   . Dyspnea   . Opiate withdrawal ([place]) 12/18/2014  . Depression 12/17/2014  . Hypotension 12/16/2014  . Herpes simplex virus type 1 (HSV-1) dermatitis   . Sacral fracture ([place]) 12/12/2014  . Fall 12/12/2014  . Nausea and vomiting 12/12/2014  . Leukocytosis 12/12/2014  . Chronic lower back pain 12/12/2014  . Sacral fracture, closed ([place]) 12/12/2014  . Shingles outbreak 12/12/2014  . Nausea with vomiting   . Intractable abdominal pain 10/15/2014  . Abdominal pain 10/15/2014  . Hypokalemia 09/04/2014  . Sepsis ([place]) 09/01/2014  . Abdominal pain, generalized 09/01/2014  . PTSD (post-traumatic stress disorder) 09/01/2014  . Endometriosis  09/01/2014  . Nausea vomiting and diarrhea 09/01/2014  . Sinus tachycardia ([place]) 09/01/2014  . Lactic acidosis 09/01/2014  . Severe sepsis ([place]) 09/01/2014     Past Surgical History  Procedure Laterality Date  . Ablation on endometriosis  2008  . Hemorrhoid surgery  ~ 2002     Current Outpatient Rx  Name  Route  Sig  Dispense  Refill  . amLODipine (NORVASC) 10 MG tablet   Oral   Take 1 tablet (10 mg total) by mouth daily. Patient not taking: Reported on 03/13/2015   30 tablet   2   . amLODipine (NORVASC) 10 MG tablet   Oral   Take 1 tablet (10 mg total) by mouth daily.   30 tablet   0   . diazepam (VALIUM) 2 MG tablet   Oral   Take 1 tablet (2 mg total) by mouth every 12 (twelve) hours as needed for anxiety.   15 tablet   0   . dicyclomine (BENTYL) 10 MG capsule   Oral   Take 2 capsules (20 mg total) by mouth 3 (three) times daily before meals.   90 capsule   0   . dicyclomine (BENTYL) 20 MG tablet   Oral   Take 1 tablet (20 mg total) by mouth 3 (three) times daily before meals. Patient not taking: Reported on 03/13/2015   90 tablet   0   . famotidine (PEPCID) 20 MG tablet   Oral  Take 1 tablet (20 mg total) by mouth 2 (two) times daily.   60 tablet   0   . FLUoxetine (PROZAC) 20 MG capsule   Oral   Take 20 mg by mouth daily.         Marland Kitchen HYDROcodone-acetaminophen (NORCO) 10-325 MG per tablet   Oral   Take 1 tablet by mouth every 8 (eight) hours as needed for moderate pain.         Marland Kitchen HYDROcodone-acetaminophen (NORCO) 10-325 MG tablet   Oral   Take 1 tablet by mouth every 8 (eight) hours as needed for moderate pain.   30 tablet   0   . lisinopril (PRINIVIL,ZESTRIL) 20 MG tablet   Oral   Take 1 tablet by mouth daily.         Marland Kitchen loperamide (IMODIUM A-D) 2 MG tablet   Oral   Take 2 tablets (4 mg total) by mouth 4 (four) times daily as needed for diarrhea or loose stools.   30 tablet   0   . omeprazole (PRILOSEC) 20 MG capsule   Oral    Take 1 capsule (20 mg total) by mouth daily.   30 capsule   0   . ondansetron (ZOFRAN ODT) 4 MG disintegrating tablet   Oral   Take 1 tablet (4 mg total) by mouth every 8 (eight) hours as needed for nausea or vomiting.   20 tablet   0   . saccharomyces boulardii (FLORASTOR) 250 MG capsule   Oral   Take 1 capsule (250 mg total) by mouth 2 (two) times daily.   60 capsule   0   . tiZANidine (ZANAFLEX) 2 MG tablet   Oral   Take 2 mg by mouth every 6 (six) hours as needed for muscle spasms.         Marland Kitchen zolpidem (AMBIEN) 10 MG tablet   Oral   Take 1 tablet by mouth at bedtime as needed. sleep            Allergies Lactose intolerance (gi)   No family history on file.  Social History Social History  Substance Use Topics  . Smoking status: Never Smoker   . Smokeless tobacco: Never Used  . Alcohol Use: Yes     Comment: 10/15/2014 "I've drank before; nothing regular; don't drink now cause of RX I'm on"    Review of Systems  Constitutional:   No fever or chills.  Cardiovascular:   No chest pain. Respiratory:   No dyspnea or cough. Gastrointestinal:   Positive as above generalized abdominal pain with diarrhea.  Genitourinary:   Negative for dysuria or difficulty urinating. Musculoskeletal:   Negative for focal pain or swelling Neurological:   Negative for headaches 10-point ROS otherwise negative.  ____________________________________________   PHYSICAL EXAM:  VITAL SIGNS: ED Triage Vitals  Enc Vitals Group     BP 10/17/15 0141 161/97 mmHg     Pulse Rate 10/17/15 0141 117     Resp 10/17/15 0141 18     Temp 10/17/15 0141 98.3 F (36.8 C)     Temp Source 10/17/15 0141 Oral     SpO2 10/17/15 0141 100 %     Weight 10/17/15 0141 180 lb (81.647 kg)     Height 10/17/15 0141 5\' 4"  (1.626 m)     Head Cir --      Peak Flow --      Pain Score 10/17/15 0142 8     Pain Loc --  Pain Edu? --      Excl. in [place]? --     Vital signs reviewed, nursing assessments  reviewed.   Constitutional:   Alert and oriented. Well appearing and in no distress. Eyes:   No scleral icterus. No conjunctival pallor. PERRL. EOMI.  No nystagmus. ENT   Head:   Normocephalic and atraumatic.   Nose:   No congestion/rhinnorhea. No septal hematoma   Mouth/Throat:   MMM, no pharyngeal erythema. No peritonsillar mass.    Neck:   No stridor. No SubQ emphysema. No meningismus. Hematological/Lymphatic/Immunilogical:   No cervical lymphadenopathy. Cardiovascular:   RRR. Symmetric bilateral radial and DP pulses.  No murmurs.  Respiratory:   Normal respiratory effort without tachypnea nor retractions. Breath sounds are clear and equal bilaterally. No wheezes/rales/rhonchi. Gastrointestinal:   Soft with left upper quadrant tenderness. Non distended. There is no CVA tenderness.  No rebound, rigidity, or guarding. Genitourinary:   deferred Musculoskeletal:   Nontender with normal range of motion in all extremities. No joint effusions.  No lower extremity tenderness.  No edema. Neurologic:   Normal speech and language.  CN 2-10 normal. Motor grossly intact. No gross focal neurologic deficits are appreciated.  Skin:    Skin is warm, dry and intact. No rash noted.  No petechiae, purpura, or bullae.  ____________________________________________    LABS (pertinent positives/negatives) (all labs ordered are listed, but only abnormal results are displayed) Labs Reviewed  COMPREHENSIVE METABOLIC PANEL - Abnormal; Notable for the following:    Potassium 3.0 (*)    Glucose, Bld 114 (*)    Total Protein 8.4 (*)    ALT 12 (*)    Total Bilirubin <0.1 (*)    All other components within normal limits  CBC - Abnormal; Notable for the following:    Hemoglobin 11.8 (*)    RDW 15.5 (*)    All other components within normal limits  URINALYSIS COMPLETEWITH MICROSCOPIC ([facility] ONLY) - Abnormal; Notable for the following:    Color, Urine YELLOW (*)    APPearance HAZY (*)    All  other components within normal limits  LIPASE, BLOOD  TROPONIN I  POC URINE PREG, ED  POCT PREGNANCY, URINE   ____________________________________________   EKG  Interpreted by me Sinus tachycardia rate 113, normal axis intervals QRS ST segments and T waves  ____________________________________________    RADIOLOGY    ____________________________________________   PROCEDURES   ____________________________________________   INITIAL IMPRESSION / ASSESSMENT AND PLAN / ED COURSE  Pertinent labs & imaging results that were available during my care of the patient were reviewed by me and considered in my medical decision making (see chart for details).  Patient well appearing no distress, presents with generalized abdominal pain. Exam consistent with gastritis and acid reflux. Patient does have a history of gastritis identified on a recent hospitalization related to vomiting and opioid abuse and withdrawal. Labs unremarkable. Patient tolerating oral intake. Vital signs unremarkable. Feeling better after GI cocktail. We'll discharge home with antiemetics, loperamide, antacids. With oral fluids in the ED heart rate improved from 1:15 to 100.  From a controlled substance reporting system, and see that the patient recently had a new prescription for 30 Vicodin on June 19. Possible that she is expressing some mild withdrawal symptoms if she has taken all those pills already.   ____________________________________________   FINAL CLINICAL IMPRESSION(S) / ED DIAGNOSES  Final diagnoses:  Generalized abdominal pain  Gastritis  Mild dehydration       Portions of this  note were generated with dragon dictation software. Dictation errors may occur despite best attempts at proofreading.   Carrie Mew, MD 10/17/15 0700

## 2015-10-17 NOTE — Discharge Instructions (Signed)
Abdominal Pain, Adult Many things can cause abdominal pain. Usually, abdominal pain is not caused by a disease and will improve without treatment. It can often be observed and treated at home. Your health care provider will do a physical exam and possibly order blood tests and X-rays to help determine the seriousness of your pain. However, in many cases, more time must pass before a clear cause of the pain can be found. Before that point, your health care provider may not know if you need more testing or further treatment. HOME CARE INSTRUCTIONS Monitor your abdominal pain for any changes. The following actions may help to alleviate any discomfort you are experiencing:  Only take over-the-counter or prescription medicines as directed by your health care provider.  Do not take laxatives unless directed to do so by your health care provider.  Try a clear liquid diet (broth, tea, or water) as directed by your health care provider. Slowly move to a bland diet as tolerated. SEEK MEDICAL CARE IF:  You have unexplained abdominal pain.  You have abdominal pain associated with nausea or diarrhea.  You have pain when you urinate or have a bowel movement.  You experience abdominal pain that wakes you in the night.  You have abdominal pain that is worsened or improved by eating food.  You have abdominal pain that is worsened with eating fatty foods.  You have a fever. SEEK IMMEDIATE MEDICAL CARE IF:  Your pain does not go away within 2 hours.  You keep throwing up (vomiting).  Your pain is felt only in portions of the abdomen, such as the right side or the left lower portion of the abdomen.  You pass bloody or black tarry stools. MAKE SURE YOU:  Understand these instructions.  Will watch your condition.  Will get help right away if you are not doing well or get worse.   This information is not intended to replace advice given to you by your health care provider. Make sure you discuss  any questions you have with your health care provider.   Document Released: 01/17/2005 Document Revised: 12/29/2014 Document Reviewed: 12/17/2012 Elsevier Interactive Patient Education 2016 Elsevier Inc.  Dehydration, Adult Dehydration is a condition in which you do not have enough fluid or water in your body. It happens when you take in less fluid than you lose. Vital organs such as the kidneys, brain, and heart cannot function without a proper amount of fluids. Any loss of fluids from the body can cause dehydration.  Dehydration can range from mild to severe. This condition should be treated right away to help prevent it from becoming severe. CAUSES  This condition may be caused by:  Vomiting.  Diarrhea.  Excessive sweating, such as when exercising in hot or humid weather.  Not drinking enough fluid during strenuous exercise or during an illness.  Excessive urine output.  Fever.  Certain medicines. RISK FACTORS This condition is more likely to develop in:  People who are taking certain medicines that cause the body to lose excess fluid (diuretics).   People who have a chronic illness, such as diabetes, that may increase urination.  Older adults.   People who live at high altitudes.   People who participate in endurance sports.  SYMPTOMS  Mild Dehydration  Thirst.  Dry lips.  Slightly dry mouth.  Dry, warm skin. Moderate Dehydration  Very dry mouth.   Muscle cramps.   Dark urine and decreased urine production.   Decreased tear production.   Headache.  Light-headedness, especially when you stand up from a sitting position.  Severe Dehydration  Changes in skin.   Cold and clammy skin.   Skin does not spring back quickly when lightly pinched and released.   Changes in body fluids.   Extreme thirst.   No tears.   Not able to sweat when body temperature is high, such as in hot weather.   Minimal urine production.   Changes  in vital signs.   Rapid, weak pulse (more than 100 beats per minute when you are sitting still).   Rapid breathing.   Low blood pressure.   Other changes.   Sunken eyes.   Cold hands and feet.   Confusion.  Lethargy and difficulty being awakened.  Fainting (syncope).   Short-term weight loss.   Unconsciousness. DIAGNOSIS  This condition may be diagnosed based on your symptoms. You may also have tests to determine how severe your dehydration is. These tests may include:   Urine tests.   Blood tests.  TREATMENT  Treatment for this condition depends on the severity. Mild or moderate dehydration can often be treated at home. Treatment should be started right away. Do not wait until dehydration becomes severe. Severe dehydration needs to be treated at the hospital. Treatment for Mild Dehydration  Drinking plenty of water to replace the fluid you have lost.   Replacing minerals in your blood (electrolytes) that you may have lost.  Treatment for Moderate Dehydration  Consuming oral rehydration solution (ORS). Treatment for Severe Dehydration  Receiving fluid through an IV tube.   Receiving electrolyte solution through a feeding tube that is passed through your nose and into your stomach (nasogastric tube or NG tube).  Correcting any abnormalities in electrolytes. HOME CARE INSTRUCTIONS   Drink enough fluid to keep your urine clear or pale yellow.   Drink water or fluid slowly by taking small sips. You can also try sucking on ice cubes.  Have food or beverages that contain electrolytes. Examples include bananas and sports drinks.  Take over-the-counter and prescription medicines only as told by your health care provider.   Prepare ORS according to the manufacturer's instructions. Take sips of ORS every 5 minutes until your urine returns to normal.  If you have vomiting or diarrhea, continue to try to drink water, ORS, or both.   If you have  diarrhea, avoid:   Beverages that contain caffeine.   Fruit juice.   Milk.   Carbonated soft drinks.  Do not take salt tablets. This can lead to the condition of having too much sodium in your body (hypernatremia).  SEEK MEDICAL CARE IF:  You cannot eat or drink without vomiting.  You have had moderate diarrhea during a period of more than 24 hours.  You have a fever. SEEK IMMEDIATE MEDICAL CARE IF:   You have extreme thirst.  You have severe diarrhea.  You have not urinated in 6-8 hours, or you have urinated only a small amount of very dark urine.  You have shriveled skin.  You are dizzy, confused, or both.   This information is not intended to replace advice given to you by your health care provider. Make sure you discuss any questions you have with your health care provider.   Document Released: 04/09/2005 Document Revised: 12/29/2014 Document Reviewed: 08/25/2014 Elsevier Interactive Patient Education 2016 Elsevier Inc.  Gastritis, Adult Gastritis is soreness and swelling (inflammation) of the lining of the stomach. Gastritis can develop as a sudden onset (acute) or long-term (chronic) condition.  If gastritis is not treated, it can lead to stomach bleeding and ulcers. CAUSES  Gastritis occurs when the stomach lining is weak or damaged. Digestive juices from the stomach then inflame the weakened stomach lining. The stomach lining may be weak or damaged due to viral or bacterial infections. One common bacterial infection is the Helicobacter pylori infection. Gastritis can also result from excessive alcohol consumption, taking certain medicines, or having too much acid in the stomach.  SYMPTOMS  In some cases, there are no symptoms. When symptoms are present, they may include:  Pain or a burning sensation in the upper abdomen.  Nausea.  Vomiting.  An uncomfortable feeling of fullness after eating. DIAGNOSIS  Your caregiver may suspect you have gastritis based  on your symptoms and a physical exam. To determine the cause of your gastritis, your caregiver may perform the following:  Blood or stool tests to check for the H pylori bacterium.  Gastroscopy. A thin, flexible tube (endoscope) is passed down the esophagus and into the stomach. The endoscope has a light and camera on the end. Your caregiver uses the endoscope to view the inside of the stomach.  Taking a tissue sample (biopsy) from the stomach to examine under a microscope. TREATMENT  Depending on the cause of your gastritis, medicines may be prescribed. If you have a bacterial infection, such as an H pylori infection, antibiotics may be given. If your gastritis is caused by too much acid in the stomach, H2 blockers or antacids may be given. Your caregiver may recommend that you stop taking aspirin, ibuprofen, or other nonsteroidal anti-inflammatory drugs (NSAIDs). HOME CARE INSTRUCTIONS  Only take over-the-counter or prescription medicines as directed by your caregiver.  If you were given antibiotic medicines, take them as directed. Finish them even if you start to feel better.  Drink enough fluids to keep your urine clear or pale yellow.  Avoid foods and drinks that make your symptoms worse, such as:  Caffeine or alcoholic drinks.  Chocolate.  Peppermint or mint flavorings.  Garlic and onions.  Spicy foods.  Citrus fruits, such as oranges, lemons, or limes.  Tomato-based foods such as sauce, chili, salsa, and pizza.  Fried and fatty foods.  Eat small, frequent meals instead of large meals. SEEK IMMEDIATE MEDICAL CARE IF:   You have black or dark red stools.  You vomit blood or material that looks like coffee grounds.  You are unable to keep fluids down.  Your abdominal pain gets worse.  You have a fever.  You do not feel better after 1 week.  You have any other questions or concerns. MAKE SURE YOU:  Understand these instructions.  Will watch your  condition.  Will get help right away if you are not doing well or get worse.   This information is not intended to replace advice given to you by your health care provider. Make sure you discuss any questions you have with your health care provider.   Document Released: 04/03/2001 Document Revised: 10/09/2011 Document Reviewed: 05/23/2011 Elsevier Interactive Patient Education [employer].

## 2015-11-19 ENCOUNTER — Observation Stay
Admission: EM | Admit: 2015-11-19 | Discharge: 2015-11-20 | Disposition: A | Discharge status: Home / Self Care | Payer: Medicaid Other | Attending: Internal Medicine | Admitting: Internal Medicine

## 2015-11-19 ENCOUNTER — Encounter: Payer: Self-pay | Admitting: Emergency Medicine

## 2015-11-19 DIAGNOSIS — E739 Lactose intolerance, unspecified: Secondary | ICD-10-CM | POA: Insufficient documentation

## 2015-11-19 DIAGNOSIS — F4310 Post-traumatic stress disorder, unspecified: Secondary | ICD-10-CM | POA: Diagnosis not present

## 2015-11-19 DIAGNOSIS — M5126 Other intervertebral disc displacement, lumbar region: Secondary | ICD-10-CM | POA: Insufficient documentation

## 2015-11-19 DIAGNOSIS — Z79899 Other long term (current) drug therapy: Secondary | ICD-10-CM | POA: Diagnosis not present

## 2015-11-19 DIAGNOSIS — M1990 Unspecified osteoarthritis, unspecified site: Secondary | ICD-10-CM | POA: Insufficient documentation

## 2015-11-19 DIAGNOSIS — E785 Hyperlipidemia, unspecified: Secondary | ICD-10-CM | POA: Insufficient documentation

## 2015-11-19 DIAGNOSIS — I10 Essential (primary) hypertension: Secondary | ICD-10-CM | POA: Diagnosis not present

## 2015-11-19 DIAGNOSIS — M545 Low back pain: Secondary | ICD-10-CM | POA: Diagnosis not present

## 2015-11-19 DIAGNOSIS — Z8249 Family history of ischemic heart disease and other diseases of the circulatory system: Secondary | ICD-10-CM | POA: Insufficient documentation

## 2015-11-19 DIAGNOSIS — R6510 Systemic inflammatory response syndrome (SIRS) of non-infectious origin without acute organ dysfunction: Secondary | ICD-10-CM | POA: Insufficient documentation

## 2015-11-19 DIAGNOSIS — F419 Anxiety disorder, unspecified: Secondary | ICD-10-CM | POA: Insufficient documentation

## 2015-11-19 DIAGNOSIS — F329 Major depressive disorder, single episode, unspecified: Secondary | ICD-10-CM | POA: Insufficient documentation

## 2015-11-19 DIAGNOSIS — Z85828 Personal history of other malignant neoplasm of skin: Secondary | ICD-10-CM | POA: Diagnosis not present

## 2015-11-19 DIAGNOSIS — M5030 Other cervical disc degeneration, unspecified cervical region: Secondary | ICD-10-CM | POA: Insufficient documentation

## 2015-11-19 DIAGNOSIS — K529 Noninfective gastroenteritis and colitis, unspecified: Principal | ICD-10-CM | POA: Insufficient documentation

## 2015-11-19 DIAGNOSIS — K2970 Gastritis, unspecified, without bleeding: Secondary | ICD-10-CM | POA: Diagnosis present

## 2015-11-19 DIAGNOSIS — E860 Dehydration: Secondary | ICD-10-CM | POA: Diagnosis present

## 2015-11-19 DIAGNOSIS — N179 Acute kidney failure, unspecified: Secondary | ICD-10-CM | POA: Insufficient documentation

## 2015-11-19 DIAGNOSIS — R51 Headache: Secondary | ICD-10-CM | POA: Insufficient documentation

## 2015-11-19 DIAGNOSIS — Z765 Malingerer [conscious simulation]: Secondary | ICD-10-CM | POA: Diagnosis not present

## 2015-11-19 DIAGNOSIS — G8929 Other chronic pain: Secondary | ICD-10-CM | POA: Insufficient documentation

## 2015-11-19 DIAGNOSIS — R230 Cyanosis: Secondary | ICD-10-CM | POA: Insufficient documentation

## 2015-11-19 DIAGNOSIS — R109 Unspecified abdominal pain: Secondary | ICD-10-CM

## 2015-11-19 LAB — URINALYSIS COMPLETE WITH MICROSCOPIC (ARMC ONLY)
Bilirubin Urine: NEGATIVE
Glucose, UA: NEGATIVE mg/dL
Hgb urine dipstick: NEGATIVE
Leukocytes, UA: NEGATIVE
Nitrite: NEGATIVE
PH: 5 (ref 5.0–8.0)
PROTEIN: NEGATIVE mg/dL
Specific Gravity, Urine: 1.014 (ref 1.005–1.030)

## 2015-11-19 LAB — DIFFERENTIAL
Basophils Absolute: 0.1 10*3/uL (ref 0–0.1)
Basophils Relative: 0 %
EOS PCT: 2 %
Eosinophils Absolute: 0.3 10*3/uL (ref 0–0.7)
LYMPHS ABS: 0.8 10*3/uL — AB (ref 1.0–3.6)
LYMPHS PCT: 5 %
MONOS PCT: 4 %
Monocytes Absolute: 0.8 10*3/uL (ref 0.2–0.9)
NEUTROS PCT: 89 %
Neutro Abs: 16 10*3/uL — ABNORMAL HIGH (ref 1.4–6.5)

## 2015-11-19 LAB — COMPREHENSIVE METABOLIC PANEL
ALK PHOS: 86 U/L (ref 38–126)
ALT: 21 U/L (ref 14–54)
AST: 23 U/L (ref 15–41)
Albumin: 4.3 g/dL (ref 3.5–5.0)
Anion gap: 12 (ref 5–15)
BUN: 12 mg/dL (ref 6–20)
CALCIUM: 9.2 mg/dL (ref 8.9–10.3)
CHLORIDE: 105 mmol/L (ref 101–111)
CO2: 22 mmol/L (ref 22–32)
CREATININE: 0.84 mg/dL (ref 0.44–1.00)
Glucose, Bld: 134 mg/dL — ABNORMAL HIGH (ref 65–99)
Potassium: 3.1 mmol/L — ABNORMAL LOW (ref 3.5–5.1)
Sodium: 139 mmol/L (ref 135–145)
Total Bilirubin: 0.4 mg/dL (ref 0.3–1.2)
Total Protein: 8.8 g/dL — ABNORMAL HIGH (ref 6.5–8.1)

## 2015-11-19 LAB — CBC
HCT: 38.4 % (ref 35.0–47.0)
HEMOGLOBIN: 12.6 g/dL (ref 12.0–16.0)
MCH: 26.8 pg (ref 26.0–34.0)
MCHC: 32.8 g/dL (ref 32.0–36.0)
MCV: 81.6 fL (ref 80.0–100.0)
Platelets: 363 10*3/uL (ref 150–440)
RBC: 4.7 MIL/uL (ref 3.80–5.20)
RDW: 16.2 % — ABNORMAL HIGH (ref 11.5–14.5)
WBC: 17.4 10*3/uL — AB (ref 3.6–11.0)

## 2015-11-19 LAB — POCT PREGNANCY, URINE: Preg Test, Ur: NEGATIVE

## 2015-11-19 LAB — LIPASE, BLOOD: LIPASE: 16 U/L (ref 11–51)

## 2015-11-19 MED ORDER — AMLODIPINE BESYLATE 10 MG PO TABS
10.0000 mg | ORAL_TABLET | Freq: Every day | ORAL | Status: DC
  Administered 2015-11-19 – 2015-11-20 (×2): 10 mg via ORAL
  Filled 2015-11-19 (×2): qty 1

## 2015-11-19 MED ORDER — SODIUM CHLORIDE 0.9 % IV SOLN
1000.0000 mL | Freq: Once | INTRAVENOUS | Status: AC
  Administered 2015-11-19: 1000 mL via INTRAVENOUS

## 2015-11-19 MED ORDER — ENOXAPARIN SODIUM 40 MG/0.4ML ~~LOC~~ SOLN
40.0000 mg | SUBCUTANEOUS | Status: DC
  Administered 2015-11-19: 40 mg via SUBCUTANEOUS
  Filled 2015-11-19: qty 0.4

## 2015-11-19 MED ORDER — TIZANIDINE HCL 4 MG PO TABS
2.0000 mg | ORAL_TABLET | Freq: Four times a day (QID) | ORAL | Status: DC | PRN
  Administered 2015-11-19 – 2015-11-20 (×4): 2 mg via ORAL
  Filled 2015-11-19 (×5): qty 1

## 2015-11-19 MED ORDER — CLONIDINE HCL 0.1 MG PO TABS
0.1000 mg | ORAL_TABLET | Freq: Four times a day (QID) | ORAL | Status: DC | PRN
  Administered 2015-11-19: 0.1 mg via ORAL
  Filled 2015-11-19: qty 1

## 2015-11-19 MED ORDER — POTASSIUM CHLORIDE IN NACL 20-0.9 MEQ/L-% IV SOLN
INTRAVENOUS | Status: DC
  Administered 2015-11-19: 16:00:00 via INTRAVENOUS
  Administered 2015-11-20: 1000 mL via INTRAVENOUS
  Administered 2015-11-20: 01:00:00 via INTRAVENOUS
  Filled 2015-11-19 (×4): qty 1000

## 2015-11-19 MED ORDER — METOPROLOL TARTRATE 5 MG/5ML IV SOLN
5.0000 mg | Freq: Once | INTRAVENOUS | Status: AC
  Administered 2015-11-19: 5 mg via INTRAVENOUS
  Filled 2015-11-19: qty 5

## 2015-11-19 MED ORDER — METOCLOPRAMIDE HCL 5 MG/ML IJ SOLN
10.0000 mg | Freq: Once | INTRAMUSCULAR | Status: AC
  Administered 2015-11-19: 10 mg via INTRAVENOUS
  Filled 2015-11-19: qty 2

## 2015-11-19 MED ORDER — ONDANSETRON HCL 4 MG/2ML IJ SOLN
4.0000 mg | Freq: Once | INTRAMUSCULAR | Status: AC
  Administered 2015-11-19: 4 mg via INTRAVENOUS
  Filled 2015-11-19: qty 2

## 2015-11-19 MED ORDER — METOPROLOL TARTRATE 5 MG/5ML IV SOLN
5.0000 mg | Freq: Four times a day (QID) | INTRAVENOUS | Status: DC
  Administered 2015-11-19: 5 mg via INTRAVENOUS

## 2015-11-19 MED ORDER — METOPROLOL TARTRATE 5 MG/5ML IV SOLN: 5.0000 mg | Freq: Once | INTRAVENOUS | Status: DC

## 2015-11-19 MED ORDER — HYDROCODONE-ACETAMINOPHEN 10-325 MG PO TABS
1.0000 | ORAL_TABLET | Freq: Four times a day (QID) | ORAL | Status: DC | PRN
  Administered 2015-11-19 – 2015-11-20 (×4): 1 via ORAL
  Filled 2015-11-19 (×4): qty 1

## 2015-11-19 MED ORDER — PROMETHAZINE HCL 25 MG/ML IJ SOLN: 12.5000 mg | Freq: Four times a day (QID) | INTRAMUSCULAR | Status: DC | PRN

## 2015-11-19 MED ORDER — METOPROLOL TARTRATE 5 MG/5ML IV SOLN
5.0000 mg | Freq: Four times a day (QID) | INTRAVENOUS | Status: DC
  Filled 2015-11-19: qty 5

## 2015-11-19 MED ORDER — FLUOXETINE HCL 20 MG PO CAPS
20.0000 mg | ORAL_CAPSULE | Freq: Every day | ORAL | Status: DC
  Administered 2015-11-19 – 2015-11-20 (×2): 20 mg via ORAL
  Filled 2015-11-19 (×2): qty 1

## 2015-11-19 MED ORDER — LORAZEPAM 0.5 MG PO TABS
0.5000 mg | ORAL_TABLET | Freq: Four times a day (QID) | ORAL | Status: DC | PRN
  Administered 2015-11-19 – 2015-11-20 (×4): 0.5 mg via ORAL
  Filled 2015-11-19 (×4): qty 1

## 2015-11-19 MED ORDER — METOPROLOL TARTRATE 5 MG/5ML IV SOLN
INTRAVENOUS | Status: AC
  Administered 2015-11-19: 5 mg via INTRAVENOUS
  Filled 2015-11-19: qty 5

## 2015-11-19 MED ORDER — MORPHINE SULFATE (PF) 4 MG/ML IV SOLN
INTRAVENOUS | Status: AC
  Filled 2015-11-19: qty 1

## 2015-11-19 MED ORDER — FAMOTIDINE IN NACL 20-0.9 MG/50ML-% IV SOLN
20.0000 mg | Freq: Two times a day (BID) | INTRAVENOUS | Status: DC
  Administered 2015-11-19 – 2015-11-20 (×2): 20 mg via INTRAVENOUS
  Filled 2015-11-19 (×3): qty 50

## 2015-11-19 MED ORDER — ONDANSETRON HCL 4 MG/2ML IJ SOLN
4.0000 mg | Freq: Four times a day (QID) | INTRAMUSCULAR | Status: DC | PRN
Start: 2015-11-19 — End: 2015-11-20
  Administered 2015-11-19: 4 mg via INTRAVENOUS
  Filled 2015-11-19: qty 2

## 2015-11-19 MED ORDER — MORPHINE SULFATE (PF) 4 MG/ML IV SOLN
4.0000 mg | Freq: Once | INTRAVENOUS | Status: AC
  Administered 2015-11-19: 4 mg via INTRAVENOUS

## 2015-11-19 MED ORDER — GI COCKTAIL ~~LOC~~
30.0000 mL | Freq: Once | ORAL | Status: DC
  Filled 2015-11-19: qty 30

## 2015-11-19 NOTE — ED Notes (Signed)
Unsuccessful attempt for IV placement x2-once by [facility]itt and once by [facility].

## 2015-11-19 NOTE — ED Notes (Signed)
Agricultural consultant on [place] notified of pt current BP 170/104; she states that when she has a chance to look at computer she will look into re-accepting pt to unit.

## 2015-11-19 NOTE — ED Triage Notes (Signed)
Pt states yesterday started having N/V/D.  Also c/o lower abdominal pain that feels like cramping.  Pt vomited x3-4 over night. 2 Diarrhea episodes overnight. Pt did take Zofran earlier in the night but is not sure what time.

## 2015-11-19 NOTE — ED Provider Notes (Addendum)
[facility] Emergency Department Provider Note   ____________________________________________    I have reviewed the triage vital signs and the nursing notes.   HISTORY  Chief Complaint Nausea and vomiting    HPI [name] is a 45 y.o. female who presents with complaints of moderate upper and lower abdominal cramping discomfort and vomiting. She reports this is been going on for approximately 24 hours. She has been up all night vomiting. She denies fevers or chills. She has had this numerous times before. No recent travel. No diarrhea. She reports she was admitted for this in June which I verified via medical records. She also has a history of opioid abuse and drug-seeking behavior.    Past Medical History:  Diagnosis Date  . Anxiety   . Arthritis    "joints ache all over" (10/15/2014)  . Bulging lumbar disc   . Chronic lower back pain   . DDD (degenerative disc disease), cervical   . Depression   . Drug-seeking behavior   . Headache    "weekly" (10/15/2014)  . Hyperlipemia   . Hypertension   . PTSD (post-traumatic stress disorder)   . Skin cancer    "had them cut off my arms; don't know what kind"    Patient Active Problem List   Diagnosis Date Noted  . Hypomagnesemia   . AKI (acute kidney injury) ([place])   . C. difficile colitis   . SIRS (systemic inflammatory response syndrome) ([place]) 01/11/2015  . Cellulitis 01/03/2015  . Tachycardia   . Dyspnea   . Opiate withdrawal ([place]) 12/18/2014  . Depression 12/17/2014  . Hypotension 12/16/2014  . Herpes simplex virus type 1 (HSV-1) dermatitis   . Sacral fracture ([place]) 12/12/2014  . Fall 12/12/2014  . Nausea and vomiting 12/12/2014  . Leukocytosis 12/12/2014  . Chronic lower back pain 12/12/2014  . Sacral fracture, closed ([place]) 12/12/2014  . Shingles outbreak 12/12/2014  . Nausea with vomiting   . Intractable abdominal pain 10/15/2014  . Abdominal pain 10/15/2014  . Hypokalemia  09/04/2014  . Sepsis ([place]) 09/01/2014  . Abdominal pain, generalized 09/01/2014  . PTSD (post-traumatic stress disorder) 09/01/2014  . Endometriosis 09/01/2014  . Nausea vomiting and diarrhea 09/01/2014  . Sinus tachycardia ([place]) 09/01/2014  . Lactic acidosis 09/01/2014  . Severe sepsis ([place]) 09/01/2014    Past Surgical History:  Procedure Laterality Date  . ABLATION ON ENDOMETRIOSIS  2008  . HEMORRHOID SURGERY  ~ 2002    Prior to Admission medications   Medication Sig Start Date End Date Taking? Authorizing Provider  amLODipine (NORVASC) 10 MG tablet Take 1 tablet (10 mg total) by mouth daily. Patient not taking: Reported on 03/13/2015 10/17/14   Kelvin Cellar, MD  amLODipine (NORVASC) 10 MG tablet Take 1 tablet (10 mg total) by mouth daily. 10/10/15   Lytle Butte, MD  diazepam (VALIUM) 2 MG tablet Take 1 tablet (2 mg total) by mouth every 12 (twelve) hours as needed for anxiety. 01/17/15   Barton Dubois, MD  dicyclomine (BENTYL) 10 MG capsule Take 2 capsules (20 mg total) by mouth 3 (three) times daily before meals. 10/10/15   Lytle Butte, MD  dicyclomine (BENTYL) 20 MG tablet Take 1 tablet (20 mg total) by mouth 3 (three) times daily before meals. Patient not taking: Reported on 03/13/2015 01/17/15   Barton Dubois, MD  famotidine (PEPCID) 20 MG tablet Take 1 tablet (20 mg total) by mouth 2 (two) times daily. 10/17/15   Carrie Mew,  MD  FLUoxetine (PROZAC) 20 MG capsule Take 20 mg by mouth daily.    Historical Provider, MD  HYDROcodone-acetaminophen (NORCO) 10-325 MG per tablet Take 1 tablet by mouth every 8 (eight) hours as needed for moderate pain. 01/17/15   Barton Dubois, MD  HYDROcodone-acetaminophen (NORCO) 10-325 MG tablet Take 1 tablet by mouth every 8 (eight) hours as needed for moderate pain. 10/10/15   Lytle Butte, MD  lisinopril (PRINIVIL,ZESTRIL) 20 MG tablet Take 1 tablet by mouth daily.    Historical Provider, MD  loperamide (IMODIUM A-D) 2 MG tablet Take 2  tablets (4 mg total) by mouth 4 (four) times daily as needed for diarrhea or loose stools. 10/17/15   Carrie Mew, MD  omeprazole (PRILOSEC) 20 MG capsule Take 1 capsule (20 mg total) by mouth daily. 03/13/15   Marella Chimes, PA-C  ondansetron (ZOFRAN ODT) 4 MG disintegrating tablet Take 1 tablet (4 mg total) by mouth every 8 (eight) hours as needed for nausea or vomiting. 10/17/15   Carrie Mew, MD  saccharomyces boulardii (FLORASTOR) 250 MG capsule Take 1 capsule (250 mg total) by mouth 2 (two) times daily. 01/17/15   Barton Dubois, MD  tiZANidine (ZANAFLEX) 2 MG tablet Take 2 mg by mouth every 6 (six) hours as needed for muscle spasms.    Historical Provider, MD  zolpidem (AMBIEN) 10 MG tablet Take 1 tablet by mouth at bedtime as needed. sleep 03/08/15   Historical Provider, MD  .   Allergies Lactose intolerance (gi)  No family history on file.  Social History Social History  Substance Use Topics  . Smoking status: Never Smoker  . Smokeless tobacco: Never Used  . Alcohol use Yes     Comment: 10/15/2014 "I've drank before; nothing regular; don't drink now cause of RX I'm on"    Review of Systems  Constitutional: No fever/chills Eyes: No visual changes.  ENT: Mild sore throat from vomiting Cardiovascular: Denies chest pain. Respiratory: Denies shortness of breath. No cough Gastrointestinal: As above Genitourinary: Mild dysuria Musculoskeletal: Negative for back pain. Skin: Negative for rash. Neurological: Negative for headaches   10-point ROS otherwise negative.  ____________________________________________   PHYSICAL EXAM:  VITAL SIGNS: BP (!) 198/129   Pulse (!) 146   Temp 98.3 F (36.8 C) (Oral)   Resp 20   Ht 5' 4.5" (1.638 m)   Wt 79.4 kg   SpO2 100%   BMI 29.57 kg/m   Constitutional: Alert and oriented. No acute distress Eyes: Conjunctivae are normal.  Head: Atraumatic.Normocephalic Nose: No congestion/rhinnorhea. Mouth/Throat: Mucous  membranes are moist.  Pharynx is normal Cardiovascular: Tachycardia, regular rhythm. Grossly normal heart sounds.  Good peripheral circulation. Respiratory: Normal respiratory effort.  No retractions. Lungs CTAB. Gastrointestinal: Soft and nontender. No distention.  No CVA tenderness. Benign abdominal exam Genitourinary: deferred Musculoskeletal: No lower extremity tenderness nor edema.  Warm and well perfused Neurologic:  Normal speech and language. No gross focal neurologic deficits are appreciated.  Skin:  Skin is warm, dry and intact. No rash noted. Psychiatric: Mood and affect are normal. Speech and behavior are normal.  ____________________________________________   LABS (all labs ordered are listed, but only abnormal results are displayed)  Labs Reviewed  CBC  COMPREHENSIVE METABOLIC PANEL  LIPASE, BLOOD  URINALYSIS COMPLETEWITH MICROSCOPIC ([place])   ____________________________________________  EKG  ED ECG REPORT I, Lavonia Drafts, the attending physician, personally viewed and interpreted this ECG.  Date: 12/02/2015 EKG Time: 7:39 AM Rate: 82 Rhythm: normal sinus rhythm QRS Axis:  normal Intervals: normal ST/T Wave abnormalities: normal Conduction Disturbances: none Narrative Interpretation: unremarkable  ____________________________________________  RADIOLOGY  none ____________________________________________   PROCEDURES  Procedure(s) performed: No    Critical Care performed: No ____________________________________________   INITIAL IMPRESSION / ASSESSMENT AND PLAN / ED COURSE  Pertinent labs & imaging results that were available during my care of the patient were reviewed by me and considered in my medical decision making (see chart for details).  Patient presents with nausea vomiting and abdominal cramping. She has suffered from gastritis in the past. We will check labs and treat with IV Zofran and fluids and possibly GI cocktail and  reevaluate. Patient is afebrile, I do not suspect this is sepsis  Clinical Course   __----------------------------------------- 10:32 AM on 11/19/2015 -----------------------------------------  Patient with episodes of significant tachycardia. She does have a leukocytosis but on repeat abdominal exam she has no tenderness to palpation. Do not feel CT scan is necessary however admission appears warranted given her intractable nausea and vomiting even after multiple antiemetics.   __________________________________________   FINAL CLINICAL IMPRESSION(S) / ED DIAGNOSES  Final diagnoses:  Gastritis  Dehydration      NEW MEDICATIONS STARTED DURING THIS VISIT:  New Prescriptions   No medications on file     Note:  This document was prepared using Dragon voice recognition software and may include unintentional dictation errors.    Lavonia Drafts, MD 11/19/15 [id number]    Lavonia Drafts, MD 12/02/15 [phone]

## 2015-11-19 NOTE — Progress Notes (Signed)
Spoke with Dr Jannifer Franklin regarding pt's complaint of burning while urinating.  UA this AM is negative.  Differential done on labs from this AM. Dorna Bloom RN

## 2015-11-19 NOTE — ED Notes (Signed)
Dr. Corky Downs notified that patient continues to be tachycardic and retching as well. new order for second IV bolus and nausea medication

## 2015-11-19 NOTE — H&P (Signed)
[place] [facility] [place] NAME: [name]    MR#:  [id number]  DATE OF BIRTH:  [birth year]  DATE OF ADMISSION:  11/19/2015  PRIMARY CARE PHYSICIAN: Leonard Downing, MD   REQUESTING/REFERRING PHYSICIAN: [email or web address]  CHIEF COMPLAINT:  Intractable nausea and vomiting   HISTORY OF PRESENT ILLNESS:  [name]  is a 45 y.o. female with a known history of Anxiety, hypertension, hyperlipidemia, post traumatic stress disorder and drug seeking behavior with recent admission of narcotic withdrawal came  into the ED with a chief complaint of intractable nausea and vomiting. Patient has reported that she has been vomiting and having diarrhea for the past 3 days and diarrhea was resolved for the past 24 hours. Still continues to vomit and feeling nauseous. Reporting generalized abdominal pain. Patient takes Vicodin 10 mg at home as needed basis. Denies any fever denies any sick contacts. Denies any dizziness or loss of consciousness. In the ED patient was given antiemetics and IV fluids and was found to be tachycardic  PAST MEDICAL HISTORY:   Past Medical History:  Diagnosis Date  . Anxiety   . Arthritis    "joints ache all over" (10/15/2014)  . Bulging lumbar disc   . Chronic lower back pain   . DDD (degenerative disc disease), cervical   . Depression   . Drug-seeking behavior   . Headache    "weekly" (10/15/2014)  . Hyperlipemia   . Hypertension   . PTSD (post-traumatic stress disorder)   . Skin cancer    "had them cut off my arms; don't know what kind"    PAST SURGICAL HISTOIRY:   Past Surgical History:  Procedure Laterality Date  . ABLATION ON ENDOMETRIOSIS  2008  . HEMORRHOID SURGERY  ~ 2002    SOCIAL HISTORY:   Social History  Substance Use Topics  . Smoking status: Never Smoker  . Smokeless tobacco: Never Used  . Alcohol use Yes     Comment: 10/15/2014 "I've drank before; nothing regular; don't drink now cause of RX I'm  on"    FAMILY HISTORY:  Hypertension runs in her family  DRUG ALLERGIES:   Allergies  Allergen Reactions  . Lactose Intolerance (Gi) Nausea Only    REVIEW OF SYSTEMS:  CONSTITUTIONAL: No fever, fatigue or weakness.  EYES: No blurred or double vision.  EARS, NOSE, AND THROAT: No tinnitus or ear pain.  RESPIRATORY: No cough, shortness of breath, wheezing or hemoptysis.  CARDIOVASCULAR: No chest pain, orthopnea, edema.  GASTROINTESTINAL: Reporting nausea, vomiting, diarrhea and generalized abdominal pain.  GENITOURINARY: No dysuria, hematuria.  ENDOCRINE: No polyuria, nocturia,  HEMATOLOGY: No anemia, easy bruising or bleeding SKIN: No rash or lesion. MUSCULOSKELETAL: No joint pain or arthritis.   NEUROLOGIC: No tingling, numbness, weakness.  PSYCHIATRY: No anxiety or depression.   MEDICATIONS AT HOME:   Prior to Admission medications   Medication Sig Start Date End Date Taking? Authorizing Provider  amLODipine (NORVASC) 10 MG tablet Take 1 tablet (10 mg total) by mouth daily. 10/10/15  Yes Lytle Butte, MD  dicyclomine (BENTYL) 10 MG capsule Take 2 capsules (20 mg total) by mouth 3 (three) times daily before meals. 10/10/15  Yes Lytle Butte, MD  famotidine (PEPCID) 20 MG tablet Take 1 tablet (20 mg total) by mouth 2 (two) times daily. 10/17/15  Yes Carrie Mew, MD  FLUoxetine (PROZAC) 20 MG capsule Take 20 mg by mouth daily.   Yes Historical Provider, MD  HYDROcodone-acetaminophen (NORCO) 10-325 MG  per tablet Take 1 tablet by mouth every 8 (eight) hours as needed for moderate pain. 01/17/15  Yes Barton Dubois, MD  loperamide (IMODIUM A-D) 2 MG tablet Take 2 tablets (4 mg total) by mouth 4 (four) times daily as needed for diarrhea or loose stools. 10/17/15  Yes Carrie Mew, MD  omeprazole (PRILOSEC) 20 MG capsule Take 1 capsule (20 mg total) by mouth daily. 03/13/15  Yes Marella Chimes, PA-C  ondansetron (ZOFRAN ODT) 4 MG disintegrating tablet Take 1 tablet (4 mg  total) by mouth every 8 (eight) hours as needed for nausea or vomiting. 10/17/15  Yes Carrie Mew, MD  saccharomyces boulardii (FLORASTOR) 250 MG capsule Take 1 capsule (250 mg total) by mouth 2 (two) times daily. 01/17/15  Yes Barton Dubois, MD  tiZANidine (ZANAFLEX) 2 MG tablet Take 2 mg by mouth every 6 (six) hours as needed for muscle spasms.   Yes Historical Provider, MD  zolpidem (AMBIEN) 10 MG tablet Take 1 tablet by mouth at bedtime as needed. sleep 03/08/15  Yes Historical Provider, MD  amLODipine (NORVASC) 10 MG tablet Take 1 tablet (10 mg total) by mouth daily. Patient not taking: Reported on 03/13/2015 10/17/14   Kelvin Cellar, MD      VITAL SIGNS:  Blood pressure (!) 181/120, pulse 91, temperature 98.3 F (36.8 C), temperature source Oral, resp. rate 16, height 5' 4.5" (1.638 m), weight 79.4 kg (175 lb), SpO2 97 %.  PHYSICAL EXAMINATION:  GENERAL:  45 y.o.-year-old patient lying in the bed with no acute distress.  EYES: Pupils equal, round, reactive to light and accommodation. No scleral icterus. Extraocular muscles intact.  HEENT: Head atraumatic, normocephalic. Oropharynx and nasopharynx clear. Dry mucous membranes NECK:  Supple, no jugular venous distention. No thyroid enlargement, no tenderness.  LUNGS: Normal breath sounds bilaterally, no wheezing, rales,rhonchi or crepitation. No use of accessory muscles of respiration.  CARDIOVASCULAR: S1, S2 normal. No murmurs, rubs, or gallops.  ABDOMEN: Soft, minimal diffuse tenderness but no rebound tenderness, nondistended. Bowel sounds present. No organomegaly or mass.  EXTREMITIES: No pedal edema, cyanosis, or clubbing.  NEUROLOGIC: Cranial nerves II through XII are intact. Muscle strength 5/5 in all extremities. Sensation intact. Gait not checked.  PSYCHIATRIC: The patient is alert and oriented x 3.  SKIN: No obvious rash, lesion, or ulcer.   LABORATORY PANEL:   CBC  Recent Labs Lab 11/19/15 0809  WBC 17.4*  HGB 12.6   HCT 38.4  PLT 363   ------------------------------------------------------------------------------------------------------------------  Chemistries   Recent Labs Lab 11/19/15 0809  NA 139  K 3.1*  CL 105  CO2 22  GLUCOSE 134*  BUN 12  CREATININE 0.84  CALCIUM 9.2  AST 23  ALT 21  ALKPHOS 86  BILITOT 0.4   ------------------------------------------------------------------------------------------------------------------  Cardiac Enzymes No results for input(s): TROPONINI in the last 168 hours. ------------------------------------------------------------------------------------------------------------------  RADIOLOGY:  No results found.  EKG:   Orders placed or performed during the hospital encounter of 11/19/15  . EKG 12-Lead  . EKG 12-Lead  . EKG 12-Lead  . EKG 12-Lead    IMPRESSION AND PLAN:  Mirza Hron  is a 45 y.o. female with a known history of Anxiety, hypertension, hyperlipidemia, post traumatic stress disorder and drug seeking behavior with recent admission of narcotic withdrawal came  into the ED with a chief complaint of intractable nausea and vomiting. Patient has reported that she has been vomiting and having diarrhea for the past 3 days and diarrhea was resolved for the past 24 hours.  #  Acute gastroenteritis-to be viral etiology Admit to MedSurg unit Provide IV fluids for hydrationAnd provide clear liquid diet as tolerated Antiemetics and pain medications as needed Not ordering stool tests as her diarrhea completely resolved at this time for the past 24 hours Patient is afebrile but has elevated white count which could be  reactive and tachycardia which could be from dehydration not considering antibiotics at this time Will consider GI consult if no improvement  #. HypokalemiaSecondary to intractable nausea and vomiting Replete potassium and check BMP and magnesium in a.m.  #Malignant hypertension Resume home medication amlodipine Lopressor 5  mg IV as needed  #Hyperlipidemia Holding home medications that as the patient is still having intractable nausea and vomiting  #History of drug-seeking behaviors and recent history of narcotic withdrawal Will continue her home medication Vicodin and limit narcotic use as much as possible  GI prophylaxis with H2 blockers DVT prophylaxis with Lovenox subcutaneous   All the records are reviewed and case discussed with ED provider. Management plans discussed with the patient, and she is in agreement.  CODE STATUS:fc/husband  TOTAL TIME TAKING CARE OF THIS PATIENT: 45 minutes.   Note: This dictation was prepared with Dragon dictation along with smaller phrase technology. Any transcriptional errors that result from this process are unintentional.  Nicholes Mango M.D on 11/19/2015 at 12:12 PM  Between 7am to 6pm - Pager - [phone]  After 6pm go to [email or web address] - password [email or web address] [facility]  Office  [phone]  CC: Primary care physician; Leonard Downing, MD

## 2015-11-20 LAB — CBC
HCT: 29.4 % — ABNORMAL LOW (ref 35.0–47.0)
HEMOGLOBIN: 9.8 g/dL — AB (ref 12.0–16.0)
MCH: 27.7 pg (ref 26.0–34.0)
MCHC: 33.4 g/dL (ref 32.0–36.0)
MCV: 82.8 fL (ref 80.0–100.0)
PLATELETS: 229 10*3/uL (ref 150–440)
RBC: 3.56 MIL/uL — AB (ref 3.80–5.20)
RDW: 16.1 % — ABNORMAL HIGH (ref 11.5–14.5)
WBC: 7.5 10*3/uL (ref 3.6–11.0)

## 2015-11-20 LAB — COMPREHENSIVE METABOLIC PANEL
ALBUMIN: 3.2 g/dL — AB (ref 3.5–5.0)
ALK PHOS: 59 U/L (ref 38–126)
ALT: 13 U/L — AB (ref 14–54)
AST: 13 U/L — AB (ref 15–41)
Anion gap: 7 (ref 5–15)
BUN: 6 mg/dL (ref 6–20)
CALCIUM: 7.9 mg/dL — AB (ref 8.9–10.3)
CHLORIDE: 110 mmol/L (ref 101–111)
CO2: 22 mmol/L (ref 22–32)
CREATININE: 0.51 mg/dL (ref 0.44–1.00)
GFR calc Af Amer: >60 mL/min (ref >60)
GFR calc non Af Amer: >60 mL/min (ref >60)
Glucose, Bld: 85 mg/dL (ref 65–99)
Potassium: 3.4 mmol/L — ABNORMAL LOW (ref 3.5–5.1)
SODIUM: 139 mmol/L (ref 135–145)
Total Bilirubin: 0.7 mg/dL (ref 0.3–1.2)
Total Protein: 6.4 g/dL — ABNORMAL LOW (ref 6.5–8.1)

## 2015-11-20 LAB — MAGNESIUM: Magnesium: 1.9 mg/dL (ref 1.7–2.4)

## 2015-11-20 MED ORDER — DEXTROSE 5 % IV SOLN
1.0000 g | INTRAVENOUS | Status: DC
  Administered 2015-11-20: 1 g via INTRAVENOUS
  Filled 2015-11-20 (×2): qty 10

## 2015-11-20 MED ORDER — TIZANIDINE HCL 2 MG PO TABS: 2.0000 mg | ORAL_TABLET | Freq: Three times a day (TID) | ORAL | 0 refills | Status: DC | PRN

## 2015-11-20 NOTE — Progress Notes (Signed)
Called that pt has burning with urination.  Recent UA does not show UTI, culture sent.  However, patient has leukocytosis.  Differential ordered and shows bandemia.  Will order CXR as part of workup, but will also start empiric abx for UTI.  Jacqulyn Bath [facility] 11/20/2015, 1:38 AM

## 2015-11-20 NOTE — Progress Notes (Signed)
Pharmacy Antibiotic Note  [name] is a 45 y.o. female admitted on 11/19/2015 with UTI.  Pharmacy has been consulted for ceftriaxone dosing.  Plan: Ceftriaxone 1 gram q 24 hours ordered by MD. Will continue current dosing.  Height: 5\' 4"  (162.6 cm) Weight: 180 lb 11.2 oz (82 kg) IBW/kg (Calculated) : 54.7  Temp (24hrs), Avg:98.2 F (36.8 C), Min:98 F (36.7 C), Max:98.3 F (36.8 C)   Recent Labs Lab 11/19/15 0809  WBC 17.4*  CREATININE 0.84    Estimated Creatinine Clearance: 88.5 mL/min (by C-G formula based on SCr of 0.84 mg/dL).    Allergies  Allergen Reactions  . Lactose Intolerance (Gi) Nausea Only    Antimicrobials this admission: ceftriaxone  >>    >>   Dose adjustments this admission:   Microbiology results:  7/29 UCx: pending   7/29 UA: (-), but patient symptomatic of possible UTI  Thank you for allowing pharmacy to be a part of this patient's care.  Catricia Scheerer S 11/20/2015 2:48 AM

## 2015-11-20 NOTE — Discharge Instructions (Addendum)
Resume regular diet and activity. Notify your doctor if your symptoms return and/or worsen. Notify your doctor for pain not relieved from the prescribed pain medication. Drink plenty of fluids.  Notify your doctor with any questions or concerns.

## 2015-11-20 NOTE — Progress Notes (Signed)
Patient discharged to home as ordered. IV discontinued as ordered site clean and dry. Patient is alert and oriented time 4 no acute distress noted. Patinet instructed to follow up with Dr. Arelia Sneddon in one week and patient instructed call and make appointment

## 2015-11-21 LAB — URINE CULTURE

## 2015-11-21 NOTE — Discharge Summary (Signed)
[place] [facility] [place] NAME: [name]    MR#:  [id number]  DATE OF BIRTH:  22-Aug-1970  DATE OF ADMISSION:  11/19/2015 ADMITTING PHYSICIAN: Nicholes Mango, MD  DATE OF DISCHARGE: 11/20/2015  1:17 PM  PRIMARY CARE PHYSICIAN: Leonard Downing, MD   ADMISSION DIAGNOSIS:  Dehydration [E86.0] Gastritis [K29.70]  DISCHARGE DIAGNOSIS:  Active Problems:   Acute gastroenteritis   SECONDARY DIAGNOSIS:   Past Medical History:  Diagnosis Date  . Anxiety   . Arthritis    "joints ache all over" (10/15/2014)  . Bulging lumbar disc   . Chronic lower back pain   . DDD (degenerative disc disease), cervical   . Depression   . Drug-seeking behavior   . Headache    "weekly" (10/15/2014)  . Hyperlipemia   . Hypertension   . PTSD (post-traumatic stress disorder)   . Skin cancer    "had them cut off my arms; don't know what kind"     ADMITTING HISTORY  [name]  is a 45 y.o. female with a known history of Anxiety, hypertension, hyperlipidemia, post traumatic stress disorder and drug seeking behavior with recent admission of narcotic withdrawal came  into the ED with a chief complaint of intractable nausea and vomiting. Patient has reported that she has been vomiting and having diarrhea for the past 3 days and diarrhea was resolved for the past 24 hours. Still continues to vomit and feeling nauseous. Reporting generalized abdominal pain. Patient takes Vicodin 10 mg at home as needed basis. Denies any fever denies any sick contacts. Denies any dizziness or loss of consciousness. In the ED patient was given antiemetics and IV fluids and was found to be tachycardic  HOSPITAL COURSE:   * acute gastroenteritis This resolved with symptomatic management. Patient did not have any further diarrhea or vomiting. No abdominal pain. Afebrile. Cyanosis was likely due to hemoconcentration which has normalized with fluids.  Stable for discharge home   No medications were changed at discharge.  CONSULTS OBTAINED:    DRUG ALLERGIES:   Allergies  Allergen Reactions  . Lactose Intolerance (Gi) Nausea Only    DISCHARGE MEDICATIONS:   Discharge Medication List as of 11/20/2015 11:54 AM    CONTINUE these medications which have CHANGED   Details  tiZANidine (ZANAFLEX) 2 MG tablet Take 1 tablet (2 mg total) by mouth every 8 (eight) hours as needed for muscle spasms., Starting Sun 11/20/2015, Print      CONTINUE these medications which have NOT CHANGED   Details  dicyclomine (BENTYL) 10 MG capsule Take 2 capsules (20 mg total) by mouth 3 (three) times daily before meals., Starting Mon 10/10/2015, Print    famotidine (PEPCID) 20 MG tablet Take 1 tablet (20 mg total) by mouth 2 (two) times daily., Starting Mon 10/17/2015, Print    FLUoxetine (PROZAC) 20 MG capsule Take 20 mg by mouth daily., Until Discontinued, Historical Med    HYDROcodone-acetaminophen (NORCO) 10-325 MG per tablet Take 1 tablet by mouth every 8 (eight) hours as needed for moderate pain., Starting 01/17/2015, Until Discontinued, No Print    loperamide (IMODIUM A-D) 2 MG tablet Take 2 tablets (4 mg total) by mouth 4 (four) times daily as needed for diarrhea or loose stools., Starting Mon 10/17/2015, Print    omeprazole (PRILOSEC) 20 MG capsule Take 1 capsule (20 mg total) by mouth daily., Starting 03/13/2015, Until Discontinued, Print    ondansetron (ZOFRAN ODT) 4 MG disintegrating tablet Take 1 tablet (4 mg  total) by mouth every 8 (eight) hours as needed for nausea or vomiting., Starting Mon 10/17/2015, Print    saccharomyces boulardii (FLORASTOR) 250 MG capsule Take 1 capsule (250 mg total) by mouth 2 (two) times daily., Starting 01/17/2015, Until Discontinued, Print    zolpidem (AMBIEN) 10 MG tablet Take 1 tablet by mouth at bedtime as needed. sleep, Starting 03/08/2015, Until Discontinued, Historical Med    amLODipine (NORVASC) 10 MG tablet Take 1 tablet (10 mg total)  by mouth daily., Starting 10/17/2014, Until Discontinued, Print        Today   VITAL SIGNS:  Blood pressure 112/66, pulse 85, temperature 98.2 F (36.8 C), temperature source Oral, resp. rate 18, height 5\' 4"  (1.626 m), weight 82 kg (180 lb 11.2 oz), SpO2 95 %.  I/O:  No intake or output data in the 24 hours ending 11/21/15 1300  PHYSICAL EXAMINATION:  Physical Exam  GENERAL:  45 y.o.-year-old patient lying in the bed with no acute distress.  LUNGS: Normal breath sounds bilaterally, no wheezing, rales,rhonchi or crepitation. No use of accessory muscles of respiration.  CARDIOVASCULAR: S1, S2 normal. No murmurs, rubs, or gallops.  ABDOMEN: Soft, non-tender, non-distended. Bowel sounds present. No organomegaly or mass.  NEUROLOGIC: Moves all 4 extremities. PSYCHIATRIC: The patient is alert and oriented x 3.  SKIN: No obvious rash, lesion, or ulcer.   DATA REVIEW:   CBC  Recent Labs Lab 11/20/15 0506  WBC 7.5  HGB 9.8*  HCT 29.4*  PLT 229    Chemistries   Recent Labs Lab 11/20/15 0506  NA 139  K 3.4*  CL 110  CO2 22  GLUCOSE 85  BUN 6  CREATININE 0.51  CALCIUM 7.9*  MG 1.9  AST 13*  ALT 13*  ALKPHOS 59  BILITOT 0.7    Cardiac Enzymes No results for input(s): TROPONINI in the last 168 hours.  Microbiology Results  Results for orders placed or performed during the hospital encounter of 11/19/15  Urine culture     Status: Abnormal   Collection Time: 11/19/15  1:04 PM  Result Value Ref Range Status   Specimen Description URINE, RANDOM  Final   Special Requests NONE  Final   Culture MULTIPLE SPECIES PRESENT, SUGGEST RECOLLECTION (A)  Final   Report Status 11/21/2015 FINAL  Final    RADIOLOGY:  No results found.  Follow up with PCP in 1 week.  Management plans discussed with the patient, family and they are in agreement.  CODE STATUS:  Code Status History    Date Active Date Inactive Code Status Order ID Comments User Context   11/19/2015  2:59  PM 11/20/2015  4:22 PM Full Code [id number]  Nicholes Mango, MD Inpatient   10/07/2015 11:48 PM 10/10/2015  6:22 PM Full Code [id number]  Alesia Richards, MD ED   01/11/2015  1:28 AM 01/17/2015  4:23 PM Full Code [id number]  Etta Quill, DO ED   01/01/2015  8:03 PM 01/03/2015  9:06 PM Full Code [id number]  Marshell Garfinkel, MD ED   12/12/2014  3:29 AM 12/20/2014  4:10 PM Full Code [id number]  Theressa Millard, MD Inpatient   10/15/2014  2:31 PM 10/17/2014  3:20 PM Full Code [id number]  Oswald Hillock, MD Inpatient   09/01/2014  4:46 AM 09/05/2014  3:53 PM Full Code [id number]  Corey Harold, NP ED   09/01/2014  2:38 AM 09/01/2014  4:46 AM Full Code [id number]  Lavina Hamman, MD ED      TOTAL  TIME TAKING CARE OF THIS PATIENT ON DAY OF DISCHARGE: more than 30 minutes.   Hillary Bow R M.D on 11/21/2015 at 1:00 PM  Between 7am to 6pm - Pager - [phone]  After 6pm go to [email or web address] - password [email or web address] [facility]  Office  [phone]  CC: Primary care physician; Leonard Downing, MD  Note: This dictation was prepared with Dragon dictation along with smaller phrase technology. Any transcriptional errors that result from this process are unintentional.

## 2015-11-28 ENCOUNTER — Encounter: Payer: Self-pay | Admitting: Emergency Medicine

## 2015-11-28 ENCOUNTER — Emergency Department
Admission: EM | Admit: 2015-11-28 | Discharge: 2015-11-28 | Disposition: A | Discharge status: Home / Self Care | Payer: Medicaid Other | Attending: Emergency Medicine | Admitting: Emergency Medicine

## 2015-11-28 DIAGNOSIS — R1084 Generalized abdominal pain: Secondary | ICD-10-CM | POA: Diagnosis not present

## 2015-11-28 DIAGNOSIS — Z85828 Personal history of other malignant neoplasm of skin: Secondary | ICD-10-CM | POA: Insufficient documentation

## 2015-11-28 DIAGNOSIS — R197 Diarrhea, unspecified: Secondary | ICD-10-CM | POA: Diagnosis not present

## 2015-11-28 DIAGNOSIS — I10 Essential (primary) hypertension: Secondary | ICD-10-CM | POA: Insufficient documentation

## 2015-11-28 DIAGNOSIS — Z79899 Other long term (current) drug therapy: Secondary | ICD-10-CM | POA: Insufficient documentation

## 2015-11-28 DIAGNOSIS — R112 Nausea with vomiting, unspecified: Secondary | ICD-10-CM

## 2015-11-28 DIAGNOSIS — Z791 Long term (current) use of non-steroidal anti-inflammatories (NSAID): Secondary | ICD-10-CM | POA: Insufficient documentation

## 2015-11-28 LAB — CBC
HCT: 36.2 % (ref 35.0–47.0)
HEMOGLOBIN: 11.9 g/dL — AB (ref 12.0–16.0)
MCH: 26.8 pg (ref 26.0–34.0)
MCHC: 32.8 g/dL (ref 32.0–36.0)
MCV: 81.5 fL (ref 80.0–100.0)
Platelets: 294 10*3/uL (ref 150–440)
RBC: 4.45 MIL/uL (ref 3.80–5.20)
RDW: 15.8 % — ABNORMAL HIGH (ref 11.5–14.5)
WBC: 10 10*3/uL (ref 3.6–11.0)

## 2015-11-28 LAB — URINALYSIS COMPLETE WITH MICROSCOPIC (ARMC ONLY)
Bacteria, UA: NONE SEEN
Bilirubin Urine: NEGATIVE
Glucose, UA: NEGATIVE mg/dL
Hgb urine dipstick: NEGATIVE
Ketones, ur: NEGATIVE mg/dL
Leukocytes, UA: NEGATIVE
Nitrite: NEGATIVE
PROTEIN: NEGATIVE mg/dL
RBC / HPF: NONE SEEN RBC/hpf (ref 0–5)
SPECIFIC GRAVITY, URINE: 1.004 — AB (ref 1.005–1.030)
pH: 7 (ref 5.0–8.0)

## 2015-11-28 LAB — COMPREHENSIVE METABOLIC PANEL
ALT: 16 U/L (ref 14–54)
ANION GAP: 10 (ref 5–15)
AST: 22 U/L (ref 15–41)
Albumin: 4.1 g/dL (ref 3.5–5.0)
Alkaline Phosphatase: 78 U/L (ref 38–126)
BUN: 10 mg/dL (ref 6–20)
CHLORIDE: 100 mmol/L — AB (ref 101–111)
CO2: 29 mmol/L (ref 22–32)
Calcium: 9.4 mg/dL (ref 8.9–10.3)
Creatinine, Ser: 0.79 mg/dL (ref 0.44–1.00)
GFR calc non Af Amer: >60 mL/min (ref >60)
Glucose, Bld: 89 mg/dL (ref 65–99)
Potassium: 3.4 mmol/L — ABNORMAL LOW (ref 3.5–5.1)
SODIUM: 139 mmol/L (ref 135–145)
Total Bilirubin: 0.4 mg/dL (ref 0.3–1.2)
Total Protein: 8.6 g/dL — ABNORMAL HIGH (ref 6.5–8.1)

## 2015-11-28 LAB — LIPASE, BLOOD: LIPASE: 14 U/L (ref 11–51)

## 2015-11-28 MED ORDER — METOCLOPRAMIDE HCL 5 MG/ML IJ SOLN
INTRAMUSCULAR | Status: AC
  Administered 2015-11-28: 10 mg via INTRAVENOUS
  Filled 2015-11-28: qty 2

## 2015-11-28 MED ORDER — ONDANSETRON 4 MG PO TBDP
4.0000 mg | ORAL_TABLET | Freq: Once | ORAL | Status: AC
  Administered 2015-11-28: 4 mg via ORAL

## 2015-11-28 MED ORDER — TIZANIDINE HCL 4 MG PO TABS: 2.0000 mg | ORAL_TABLET | Freq: Three times a day (TID) | ORAL | 0 refills | Status: AC | PRN

## 2015-11-28 MED ORDER — LORAZEPAM 2 MG/ML IJ SOLN
INTRAMUSCULAR | Status: AC
  Administered 2015-11-28: 1 mg via INTRAVENOUS
  Filled 2015-11-28: qty 1

## 2015-11-28 MED ORDER — ONDANSETRON 4 MG PO TBDP
ORAL_TABLET | ORAL | Status: AC
  Filled 2015-11-28: qty 17

## 2015-11-28 MED ORDER — SODIUM CHLORIDE 0.9 % IV BOLUS (SEPSIS)
1000.0000 mL | Freq: Once | INTRAVENOUS | Status: AC
  Administered 2015-11-28: 1000 mL via INTRAVENOUS

## 2015-11-28 MED ORDER — LORAZEPAM 2 MG/ML IJ SOLN
1.0000 mg | Freq: Once | INTRAMUSCULAR | Status: AC
  Administered 2015-11-28: 1 mg via INTRAVENOUS

## 2015-11-28 MED ORDER — ONDANSETRON 4 MG PO TBDP: 4.0000 mg | ORAL_TABLET | Freq: Three times a day (TID) | ORAL | 0 refills | Status: DC | PRN

## 2015-11-28 MED ORDER — METOCLOPRAMIDE HCL 5 MG/ML IJ SOLN
10.0000 mg | Freq: Once | INTRAMUSCULAR | Status: AC
  Administered 2015-11-28: 10 mg via INTRAVENOUS
  Filled 2015-11-28: qty 2

## 2015-11-28 NOTE — ED Provider Notes (Signed)
[facility] Emergency Department Provider Note  Time seen: 3:19 PM  I have reviewed the triage vital signs and the nursing notes.   HISTORY  Chief Complaint Abdominal Pain and Emesis    HPI [name] is a 45 y.o. female with a past medical history of anxiety, chronic pain, hypertension, hyperlipidemia, presents the emergency department with nausea and vomiting and abdominal pain which began at 8 AM this morning. According to the patient she awoke around 8 AM this morning with diffuse abdominal discomfort which she describes as a cramping discomfort especially across the lower abdomen. States small amount of diarrhea this morning. Has been nauseated with several rounds of vomiting. States she has not urinated today. Denies fever. States this has happened many times in the past and they've not found a reason for her symptoms. Describes her abdominal pain as moderate diffuse.  Past Medical History:  Diagnosis Date  . Anxiety   . Arthritis    "joints ache all over" (10/15/2014)  . Bulging lumbar disc   . Chronic lower back pain   . DDD (degenerative disc disease), cervical   . Depression   . Drug-seeking behavior   . Headache    "weekly" (10/15/2014)  . Hyperlipemia   . Hypertension   . PTSD (post-traumatic stress disorder)   . Skin cancer    "had them cut off my arms; don't know what kind"    Patient Active Problem List   Diagnosis Date Noted  . Acute gastroenteritis 11/19/2015  . Hypomagnesemia   . AKI (acute kidney injury) ([place])   . C. difficile colitis   . SIRS (systemic inflammatory response syndrome) ([place]) 01/11/2015  . Cellulitis 01/03/2015  . Tachycardia   . Dyspnea   . Opiate withdrawal ([place]) 12/18/2014  . Depression 12/17/2014  . Hypotension 12/16/2014  . Herpes simplex virus type 1 (HSV-1) dermatitis   . Sacral fracture ([place]) 12/12/2014  . Fall 12/12/2014  . Nausea and vomiting 12/12/2014  . Leukocytosis 12/12/2014  . Chronic  lower back pain 12/12/2014  . Sacral fracture, closed ([place]) 12/12/2014  . Shingles outbreak 12/12/2014  . Nausea with vomiting   . Intractable abdominal pain 10/15/2014  . Abdominal pain 10/15/2014  . Hypokalemia 09/04/2014  . Sepsis ([place]) 09/01/2014  . Abdominal pain, generalized 09/01/2014  . PTSD (post-traumatic stress disorder) 09/01/2014  . Endometriosis 09/01/2014  . Nausea vomiting and diarrhea 09/01/2014  . Sinus tachycardia ([place]) 09/01/2014  . Lactic acidosis 09/01/2014  . Severe sepsis ([place]) 09/01/2014    Past Surgical History:  Procedure Laterality Date  . ABLATION ON ENDOMETRIOSIS  2008  . HEMORRHOID SURGERY  ~ 2002    Prior to Admission medications   Medication Sig Start Date End Date Taking? Authorizing Provider  amLODipine (NORVASC) 10 MG tablet Take 1 tablet (10 mg total) by mouth daily. Patient not taking: Reported on 03/13/2015 10/17/14   Kelvin Cellar, MD  dicyclomine (BENTYL) 10 MG capsule Take 2 capsules (20 mg total) by mouth 3 (three) times daily before meals. 10/10/15   Lytle Butte, MD  famotidine (PEPCID) 20 MG tablet Take 1 tablet (20 mg total) by mouth 2 (two) times daily. 10/17/15   Carrie Mew, MD  FLUoxetine (PROZAC) 20 MG capsule Take 20 mg by mouth daily.    Historical Provider, MD  HYDROcodone-acetaminophen (NORCO) 10-325 MG per tablet Take 1 tablet by mouth every 8 (eight) hours as needed for moderate pain. 01/17/15   Barton Dubois, MD  loperamide (IMODIUM A-D) 2  MG tablet Take 2 tablets (4 mg total) by mouth 4 (four) times daily as needed for diarrhea or loose stools. 10/17/15   Carrie Mew, MD  omeprazole (PRILOSEC) 20 MG capsule Take 1 capsule (20 mg total) by mouth daily. 03/13/15   Marella Chimes, PA-C  ondansetron (ZOFRAN ODT) 4 MG disintegrating tablet Take 1 tablet (4 mg total) by mouth every 8 (eight) hours as needed for nausea or vomiting. 10/17/15   Carrie Mew, MD  saccharomyces boulardii (FLORASTOR) 250 MG capsule  Take 1 capsule (250 mg total) by mouth 2 (two) times daily. 01/17/15   Barton Dubois, MD  tiZANidine (ZANAFLEX) 2 MG tablet Take 1 tablet (2 mg total) by mouth every 8 (eight) hours as needed for muscle spasms. 11/20/15   Srikar Sudini, MD  zolpidem (AMBIEN) 10 MG tablet Take 1 tablet by mouth at bedtime as needed. sleep 03/08/15   Historical Provider, MD    Allergies  Allergen Reactions  . Lactose Intolerance (Gi) Nausea Only    No family history on file.  Social History Social History  Substance Use Topics  . Smoking status: Never Smoker  . Smokeless tobacco: Never Used  . Alcohol use Yes     Comment: 10/15/2014 "I've drank before; nothing regular; don't drink now cause of RX I'm on"    Review of Systems Constitutional: Negative for fever. Cardiovascular: Negative for chest pain. Respiratory: Negative for shortness of breath. Gastrointestinal: Diffuse abdominal pain. Positive for nausea and vomiting. One episode of diarrhea. Genitourinary: Negative for dysuria. Musculoskeletal: Negative for back pain. Neurological: Negative for headaches, focal weakness or numbness. 10-point ROS otherwise negative.  ____________________________________________   PHYSICAL EXAM:  VITAL SIGNS: ED Triage Vitals [11/28/15 1352]  Enc Vitals Group     BP (!) 150/124     Pulse Rate 96     Resp 20     Temp 97.3 F (36.3 C)     Temp Source Oral     SpO2 97 %     Weight 180 lb (81.6 kg)     Height 5\' 4"  (1.626 m)     Head Circumference      Peak Flow      Pain Score      Pain Loc      Pain Edu?      Excl. in [place]?     Constitutional: Alert and oriented. Well appearing and in no distress. Eyes: Normal exam ENT   Head: Normocephalic and atraumatic.   Mouth/Throat: Mucous membranes are moist. Cardiovascular: Normal rate, regular rhythm. No murmur Respiratory: Normal respiratory effort without tachypnea nor retractions. Breath sounds are clear Gastrointestinal: Soft, mild diffuse  abdominal tenderness palpation, no rebound or guarding. No distention. Musculoskeletal: Nontender with normal range of motion in all extremities. Neurologic:  Normal speech and language. No gross focal neurologic deficit Psychiatric: Mood and affect are normal. Speech and behavior are normal.   ____________________________________________   INITIAL IMPRESSION / ASSESSMENT AND PLAN / ED COURSE  Pertinent labs & imaging results that were available during my care of the patient were reviewed by me and considered in my medical decision making (see chart for details).  The patient presents the emergency department with nausea, vomiting, diffuse abdominal discomfort since this morning. Patient has been seen multiple times for similar episodes in the past, admitted multiple times with largely negative workups. Substance database shows the patient was prescribed chronic Vicodin, but this appears to have been discontinued approximately 2 months ago. We will check labs,  IV hydrate, treat nausea and closer monitoring the emergency department.  Patient states her nausea is much improved. Patient's labs are largely within normal limits. We'll discharge home with Zofran ODT as needed, and PCP follow-up. Patient agreeable plan.  ____________________________________________   FINAL CLINICAL IMPRESSION(S) / ED DIAGNOSES  Nausea vomiting Abdominal pain    Harvest Dark, MD 11/28/15 2007

## 2015-11-28 NOTE — ED Triage Notes (Signed)
Pt presents with n/v abd pain started this am. Pt with chronic GI issues and is out of her zofran.

## 2015-11-28 NOTE — ED Triage Notes (Signed)
Unable to obtain labs out in triage.

## 2016-03-30 DIAGNOSIS — F321 Major depressive disorder, single episode, moderate: Secondary | ICD-10-CM | POA: Diagnosis not present

## 2016-05-05 ENCOUNTER — Emergency Department
Admission: EM | Admit: 2016-05-05 | Discharge: 2016-05-05 | Disposition: A | Discharge status: Home / Self Care | Payer: Medicare Other | Attending: Emergency Medicine | Admitting: Emergency Medicine

## 2016-05-05 ENCOUNTER — Encounter: Payer: Self-pay | Admitting: *Deleted

## 2016-05-05 DIAGNOSIS — J101 Influenza due to other identified influenza virus with other respiratory manifestations: Secondary | ICD-10-CM

## 2016-05-05 DIAGNOSIS — Z79899 Other long term (current) drug therapy: Secondary | ICD-10-CM | POA: Insufficient documentation

## 2016-05-05 DIAGNOSIS — J09X2 Influenza due to identified novel influenza A virus with other respiratory manifestations: Secondary | ICD-10-CM | POA: Diagnosis not present

## 2016-05-05 DIAGNOSIS — R05 Cough: Secondary | ICD-10-CM | POA: Diagnosis present

## 2016-05-05 DIAGNOSIS — Z85820 Personal history of malignant melanoma of skin: Secondary | ICD-10-CM | POA: Insufficient documentation

## 2016-05-05 DIAGNOSIS — I10 Essential (primary) hypertension: Secondary | ICD-10-CM | POA: Diagnosis not present

## 2016-05-05 LAB — INFLUENZA PANEL BY PCR (TYPE A & B)
INFLBPCR: NEGATIVE
Influenza A By PCR: POSITIVE — AB

## 2016-05-05 LAB — POCT RAPID STREP A: STREPTOCOCCUS, GROUP A SCREEN (DIRECT): NEGATIVE

## 2016-05-05 MED ORDER — OSELTAMIVIR PHOSPHATE 75 MG PO CAPS
75.0000 mg | ORAL_CAPSULE | Freq: Once | ORAL | Status: AC
  Administered 2016-05-05: 75 mg via ORAL
  Filled 2016-05-05: qty 1

## 2016-05-05 MED ORDER — IBUPROFEN 600 MG PO TABS
600.0000 mg | ORAL_TABLET | Freq: Once | ORAL | Status: AC
  Administered 2016-05-05: 600 mg via ORAL
  Filled 2016-05-05: qty 1

## 2016-05-05 MED ORDER — OSELTAMIVIR PHOSPHATE 75 MG PO CAPS: 75.0000 mg | ORAL_CAPSULE | Freq: Two times a day (BID) | ORAL | 0 refills | Status: AC

## 2016-05-05 MED ORDER — ACETAMINOPHEN 325 MG PO TABS
650.0000 mg | ORAL_TABLET | Freq: Once | ORAL | Status: AC | PRN
  Administered 2016-05-05: 650 mg via ORAL
  Filled 2016-05-05: qty 2

## 2016-05-05 NOTE — ED Triage Notes (Signed)
Pt c/o cough, sore throat, ear pain since Thursday. Fever starting on Friday. Last took ibuprofen 400 mg OtC @ 0100 today. Pt has dry congested cough in triage, is pale, has sinus drainage and a hoarse voice. Pt ambulatory to triage, no respiratory distress. Pt did not drive self to ED.

## 2016-05-05 NOTE — ED Provider Notes (Signed)
[facility] Emergency Department Provider Note   ____________________________________________   First MD Initiated Contact with Patient 05/05/16 [phone]     (approximate)  I have reviewed the triage vital signs and the nursing notes.   HISTORY  Chief Complaint Cough; Sore Throat; and Fever    HPI [name] is a 46 y.o. female who comes into the hospital today with some fever and congestion. The patient also reports that she's had some cough and pain all over. She reports that the symptoms started Thursday evening. She took Sudafed and ibuprofen for the pain but it did not help. She's had some headache and body aches as well. She said that her son and her nephew have been sick with strep throat so she was concerned. She reports that her throat has been sore and she's been unable to eat. The patient had a temperature to 103 at home. She reports that after the ibuprofen he came down to 101. The patient has been drinking well at home. She doesn't have any nausea or vomiting and no abdominal pain. She has no chest pain and shortness of breath. The patient is here for evaluation.   Past Medical History:  Diagnosis Date  . Anxiety   . Arthritis    "joints ache all over" (10/15/2014)  . Bulging lumbar disc   . Chronic lower back pain   . DDD (degenerative disc disease), cervical   . Depression   . Drug-seeking behavior   . Headache    "weekly" (10/15/2014)  . Hyperlipemia   . Hypertension   . PTSD (post-traumatic stress disorder)   . Skin cancer    "had them cut off my arms; don't know what kind"    Patient Active Problem List   Diagnosis Date Noted  . Acute gastroenteritis 11/19/2015  . Hypomagnesemia   . AKI (acute kidney injury) ([place])   . C. difficile colitis   . SIRS (systemic inflammatory response syndrome) ([place]) 01/11/2015  . Cellulitis 01/03/2015  . Tachycardia   . Dyspnea   . Opiate withdrawal ([place]) 12/18/2014  . Depression 12/17/2014    . Hypotension 12/16/2014  . Herpes simplex virus type 1 (HSV-1) dermatitis   . Sacral fracture ([place]) 12/12/2014  . Fall 12/12/2014  . Nausea and vomiting 12/12/2014  . Leukocytosis 12/12/2014  . Chronic lower back pain 12/12/2014  . Sacral fracture, closed ([place]) 12/12/2014  . Shingles outbreak 12/12/2014  . Nausea with vomiting   . Intractable abdominal pain 10/15/2014  . Abdominal pain 10/15/2014  . Hypokalemia 09/04/2014  . Sepsis ([place]) 09/01/2014  . Abdominal pain, generalized 09/01/2014  . PTSD (post-traumatic stress disorder) 09/01/2014  . Endometriosis 09/01/2014  . Nausea vomiting and diarrhea 09/01/2014  . Sinus tachycardia 09/01/2014  . Lactic acidosis 09/01/2014  . Severe sepsis ([place]) 09/01/2014    Past Surgical History:  Procedure Laterality Date  . ABLATION ON ENDOMETRIOSIS  2008  . HEMORRHOID SURGERY  ~ 2002    Prior to Admission medications   Medication Sig Start Date End Date Taking? Authorizing Provider  amLODipine (NORVASC) 10 MG tablet Take 1 tablet (10 mg total) by mouth daily. Patient not taking: Reported on 03/13/2015 10/17/14   Kelvin Cellar, MD  dicyclomine (BENTYL) 10 MG capsule Take 2 capsules (20 mg total) by mouth 3 (three) times daily before meals. 10/10/15   Lytle Butte, MD  famotidine (PEPCID) 20 MG tablet Take 1 tablet (20 mg total) by mouth 2 (two) times daily. 10/17/15   Doren Custard  Joni Fears, MD  FLUoxetine (PROZAC) 20 MG capsule Take 20 mg by mouth daily.    Historical Provider, MD  HYDROcodone-acetaminophen (NORCO) 10-325 MG per tablet Take 1 tablet by mouth every 8 (eight) hours as needed for moderate pain. 01/17/15   Barton Dubois, MD  loperamide (IMODIUM A-D) 2 MG tablet Take 2 tablets (4 mg total) by mouth 4 (four) times daily as needed for diarrhea or loose stools. 10/17/15   Carrie Mew, MD  omeprazole (PRILOSEC) 20 MG capsule Take 1 capsule (20 mg total) by mouth daily. 03/13/15   Marella Chimes, PA-C  ondansetron (ZOFRAN ODT) 4  MG disintegrating tablet Take 1 tablet (4 mg total) by mouth every 8 (eight) hours as needed for nausea or vomiting. 11/28/15   Harvest Dark, MD  oseltamivir (TAMIFLU) 75 MG capsule Take 1 capsule (75 mg total) by mouth 2 (two) times daily. 05/05/16 05/10/16  Loney Hering, MD  saccharomyces boulardii (FLORASTOR) 250 MG capsule Take 1 capsule (250 mg total) by mouth 2 (two) times daily. 01/17/15   Barton Dubois, MD  tiZANidine (ZANAFLEX) 4 MG tablet Take 0.5 tablets (2 mg total) by mouth every 8 (eight) hours as needed for muscle spasms. 11/28/15 11/27/16  Harvest Dark, MD  zolpidem (AMBIEN) 10 MG tablet Take 1 tablet by mouth at bedtime as needed. sleep 03/08/15   Historical Provider, MD    Allergies Lactose intolerance (gi)  History reviewed. No pertinent family history.  Social History Social History  Substance Use Topics  . Smoking status: Never Smoker  . Smokeless tobacco: Never Used  . Alcohol use Yes     Comment: 10/15/2014 "I've drank before; nothing regular; don't drink now cause of RX I'm on"    Review of Systems Constitutional:  fever/chills Eyes: No visual changes. ENT: No sore throat. Cardiovascular: Denies chest pain. Respiratory: Cough Gastrointestinal: No abdominal pain.  No nausea, no vomiting.  No diarrhea.  No constipation. Genitourinary: Negative for dysuria. Musculoskeletal: Body aches Skin: Negative for rash. Neurological: Headache  10-point ROS otherwise negative.  ____________________________________________   PHYSICAL EXAM:  VITAL SIGNS: ED Triage Vitals  Enc Vitals Group     BP 05/05/16 0212 113/71     Pulse Rate 05/05/16 0212 94     Resp 05/05/16 0212 18     Temp 05/05/16 0212 (!) 101.5 F (38.6 C)     Temp Source 05/05/16 0212 Oral     SpO2 05/05/16 0212 95 %     Weight 05/05/16 0213 193 lb (87.5 kg)     Height 05/05/16 0213 5\' 4"  (1.626 m)     Head Circumference --      Peak Flow --      Pain Score 05/05/16 0213 8     Pain Loc --       Pain Edu? --      Excl. in [place]? --     Constitutional: Alert and oriented. Well appearing and in mild distress. Eyes: Conjunctivae are normal. PERRL. EOMI. Head: Atraumatic. Nose: No congestion/rhinnorhea. Mouth/Throat: Mucous membranes are moist.  Oropharynx non-erythematous. Cardiovascular: Normal rate, regular rhythm. Grossly normal heart sounds.  Good peripheral circulation. Respiratory: Normal respiratory effort.  No retractions. Lungs CTAB. Gastrointestinal: Soft and nontender. No distention. Positive bowel sounds Musculoskeletal: No lower extremity tenderness nor edema.   Neurologic:  Normal speech and language.  Skin:  Skin is warm, dry and intact.  Psychiatric: Mood and affect are normal.   ____________________________________________   LABS (all labs ordered are listed, but  only abnormal results are displayed)  Labs Reviewed  INFLUENZA PANEL BY PCR (TYPE A & B, H1N1) - Abnormal; Notable for the following:       Result Value   Influenza A By PCR POSITIVE (*)    All other components within normal limits  POCT RAPID STREP A   ____________________________________________  EKG  none ____________________________________________  RADIOLOGY  none ____________________________________________   PROCEDURES  Procedure(s) performed: None  Procedures  Critical Care performed: No  ____________________________________________   INITIAL IMPRESSION / ASSESSMENT AND PLAN / ED COURSE  Pertinent labs & imaging results that were available during my care of the patient were reviewed by me and considered in my medical decision making (see chart for details).  This is a 46 year old female who comes into the hospital today with some fever and flulike symptoms. The patient had a flu swab done that was positive. I'll give the patient a dose of Tamiflu. She did receive some Tylenol in triage. I will reassess the patient.  Clinical Course    The patient's temperature  was improved after the Tylenol. I will give her a dose of ibuprofen for aches and she'll be discharged home to follow-up with her primary care physician.  ____________________________________________   FINAL CLINICAL IMPRESSION(S) / ED DIAGNOSES  Final diagnoses:  Influenza A      NEW MEDICATIONS STARTED DURING THIS VISIT:  New Prescriptions   OSELTAMIVIR (TAMIFLU) 75 MG CAPSULE    Take 1 capsule (75 mg total) by mouth 2 (two) times daily.     Note:  This document was prepared using Dragon voice recognition software and may include unintentional dictation errors.    Loney Hering, MD 05/05/16 [phone]

## 2016-05-05 NOTE — Discharge Instructions (Signed)
These rest and drink lots of fluids. Take ibuprofen 600 mg every 6 hours or Tylenol 650 mg every 4 hours for aches and fever. Please follow up with her primary care physician. Return with any shortness of breath, dizziness, vomiting and inability to keep down her medication.

## 2016-05-11 DIAGNOSIS — M62838 Other muscle spasm: Secondary | ICD-10-CM | POA: Diagnosis not present

## 2016-05-11 DIAGNOSIS — M1990 Unspecified osteoarthritis, unspecified site: Secondary | ICD-10-CM | POA: Diagnosis not present

## 2016-05-15 DIAGNOSIS — F321 Major depressive disorder, single episode, moderate: Secondary | ICD-10-CM | POA: Diagnosis not present

## 2016-06-12 DIAGNOSIS — F321 Major depressive disorder, single episode, moderate: Secondary | ICD-10-CM | POA: Diagnosis not present

## 2016-12-07 DIAGNOSIS — M25512 Pain in left shoulder: Secondary | ICD-10-CM | POA: Diagnosis not present

## 2016-12-07 DIAGNOSIS — M25572 Pain in left ankle and joints of left foot: Secondary | ICD-10-CM | POA: Diagnosis not present

## 2016-12-18 ENCOUNTER — Emergency Department
Admission: EM | Admit: 2016-12-18 | Discharge: 2016-12-18 | Disposition: A | Discharge status: Home / Self Care | Payer: Medicare Other | Attending: Emergency Medicine | Admitting: Emergency Medicine

## 2016-12-18 DIAGNOSIS — Z79899 Other long term (current) drug therapy: Secondary | ICD-10-CM | POA: Diagnosis not present

## 2016-12-18 DIAGNOSIS — S99912D Unspecified injury of left ankle, subsequent encounter: Secondary | ICD-10-CM | POA: Diagnosis present

## 2016-12-18 DIAGNOSIS — S8262XD Displaced fracture of lateral malleolus of left fibula, subsequent encounter for closed fracture with routine healing: Secondary | ICD-10-CM | POA: Diagnosis not present

## 2016-12-18 DIAGNOSIS — S42292D Other displaced fracture of upper end of left humerus, subsequent encounter for fracture with routine healing: Secondary | ICD-10-CM | POA: Insufficient documentation

## 2016-12-18 DIAGNOSIS — S8262XA Displaced fracture of lateral malleolus of left fibula, initial encounter for closed fracture: Secondary | ICD-10-CM | POA: Diagnosis not present

## 2016-12-18 DIAGNOSIS — I10 Essential (primary) hypertension: Secondary | ICD-10-CM | POA: Diagnosis not present

## 2016-12-18 DIAGNOSIS — S42292A Other displaced fracture of upper end of left humerus, initial encounter for closed fracture: Secondary | ICD-10-CM

## 2016-12-18 DIAGNOSIS — X58XXXD Exposure to other specified factors, subsequent encounter: Secondary | ICD-10-CM | POA: Insufficient documentation

## 2016-12-18 DIAGNOSIS — S42202A Unspecified fracture of upper end of left humerus, initial encounter for closed fracture: Secondary | ICD-10-CM | POA: Diagnosis not present

## 2016-12-18 MED ORDER — PROMETHAZINE HCL 25 MG PO TABS: 25.0000 mg | ORAL_TABLET | Freq: Three times a day (TID) | ORAL | 0 refills | Status: DC | PRN

## 2016-12-18 MED ORDER — TRAMADOL HCL 50 MG PO TABS: 50.0000 mg | ORAL_TABLET | Freq: Three times a day (TID) | ORAL | 0 refills | Status: DC | PRN

## 2016-12-18 MED ORDER — DICLOFENAC SODIUM 50 MG PO TBEC: 50.0000 mg | DELAYED_RELEASE_TABLET | Freq: Two times a day (BID) | ORAL | 0 refills | Status: DC

## 2016-12-18 NOTE — Discharge Instructions (Signed)
Your x-rays have been reviewed. You have non-displaced fractures of the left shoulder and left ankle. Both fractures are stable. You should wear the boot and shoulder sling as directed. Follow-up with ortho and podiatry as directed. Take the prescription meds as directed.

## 2016-12-18 NOTE — ED Triage Notes (Signed)
Known left foot fracture back in June. Pt is stating that she was not instructed to followup with any orthopedist. Back to ER due to pain and out of pain medication. Pt also hx of left shoulder fracture when foot injury occurred. Nauseated due to pain.

## 2016-12-18 NOTE — ED Notes (Signed)
First Nurse Note:  Patient wearing a boot on left lower leg.  Complaining of pain left foot and shoulder.  Declines wheelchair.

## 2016-12-18 NOTE — ED Provider Notes (Signed)
[facility] Emergency Department Provider Note ____________________________________________  Time seen: 1040  I have reviewed the triage vital signs and the nursing notes.  HISTORY  Chief Complaint  Foot Pain  HPI [name] is a 46 y.o. female presents to the ED for evaluation of a left ankle fracture and left foot fracture, that apparently was sustained back in late June or early July. Patient and husband are present and she reports that she apparently fell from a tow truck landing on her left foot and ankle as well as somehow injuring her left shoulder back in early June. She apparently did not seek treatment until August 17, which she reported to [facility] urgent care, in [place]. There she was evaluatedby x-ray and was notified later that evening by the radiologist group that she had a fracture to both her ankle and her upper arm. The patient presents now with a CD-ROM and a cam walker boot in place. She describes that she presented today because she ran out of her pain medication and reports some nausea secondary to the pain. She claims she was not given a referral or instruction to follow-up with ortho or anyone, following her evaluation. She has not notified her primary care provider in the interim, either. She denies any interim injury, accident, fall in the last week and a half since her initial evaluation.  Past Medical History:  Diagnosis Date  . Anxiety   . Arthritis    "joints ache all over" (10/15/2014)  . Bulging lumbar disc   . Chronic lower back pain   . DDD (degenerative disc disease), cervical   . Depression   . Drug-seeking behavior   . Headache    "weekly" (10/15/2014)  . Hyperlipemia   . Hypertension   . PTSD (post-traumatic stress disorder)   . Skin cancer    "had them cut off my arms; don't know what kind"    Patient Active Problem List   Diagnosis Date Noted  . Acute gastroenteritis 11/19/2015  . Hypomagnesemia   . AKI (acute  kidney injury) ([place])   . C. difficile colitis   . SIRS (systemic inflammatory response syndrome) ([place]) 01/11/2015  . Cellulitis 01/03/2015  . Tachycardia   . Dyspnea   . Opiate withdrawal ([place]) 12/18/2014  . Depression 12/17/2014  . Hypotension 12/16/2014  . Herpes simplex virus type 1 (HSV-1) dermatitis   . Sacral fracture ([place]) 12/12/2014  . Fall 12/12/2014  . Nausea and vomiting 12/12/2014  . Leukocytosis 12/12/2014  . Chronic lower back pain 12/12/2014  . Sacral fracture, closed ([place]) 12/12/2014  . Shingles outbreak 12/12/2014  . Nausea with vomiting   . Intractable abdominal pain 10/15/2014  . Abdominal pain 10/15/2014  . Hypokalemia 09/04/2014  . Sepsis ([place]) 09/01/2014  . Abdominal pain, generalized 09/01/2014  . PTSD (post-traumatic stress disorder) 09/01/2014  . Endometriosis 09/01/2014  . Nausea vomiting and diarrhea 09/01/2014  . Sinus tachycardia 09/01/2014  . Lactic acidosis 09/01/2014  . Severe sepsis ([place]) 09/01/2014    Past Surgical History:  Procedure Laterality Date  . ABLATION ON ENDOMETRIOSIS  2008  . HEMORRHOID SURGERY  ~ 2002    Prior to Admission medications   Medication Sig Start Date End Date Taking? Authorizing Provider  amLODipine (NORVASC) 10 MG tablet Take 1 tablet (10 mg total) by mouth daily. Patient not taking: Reported on 03/13/2015 10/17/14   Kelvin Cellar, MD  diclofenac (VOLTAREN) 50 MG EC tablet Take 1 tablet (50 mg total) by mouth 2 (two)  times daily. 12/18/16   Toma Arts, Dannielle Karvonen, PA-C  dicyclomine (BENTYL) 10 MG capsule Take 2 capsules (20 mg total) by mouth 3 (three) times daily before meals. 10/10/15   Hower, Aaron Mose, MD  famotidine (PEPCID) 20 MG tablet Take 1 tablet (20 mg total) by mouth 2 (two) times daily. 10/17/15   Carrie Mew, MD  FLUoxetine (PROZAC) 20 MG capsule Take 20 mg by mouth daily.    [provider]  HYDROcodone-acetaminophen (NORCO) 10-325 MG per tablet Take 1 tablet by mouth every 8 (eight)  hours as needed for moderate pain. 01/17/15   Barton Dubois, MD  loperamide (IMODIUM A-D) 2 MG tablet Take 2 tablets (4 mg total) by mouth 4 (four) times daily as needed for diarrhea or loose stools. 10/17/15   Carrie Mew, MD  omeprazole (PRILOSEC) 20 MG capsule Take 1 capsule (20 mg total) by mouth daily. 03/13/15   Marella Chimes, PA-C  ondansetron (ZOFRAN ODT) 4 MG disintegrating tablet Take 1 tablet (4 mg total) by mouth every 8 (eight) hours as needed for nausea or vomiting. 11/28/15   Harvest Dark, MD  promethazine (PHENERGAN) 25 MG tablet Take 1 tablet (25 mg total) by mouth every 8 (eight) hours as needed for nausea or vomiting. 12/18/16   Ocean Kearley, Dannielle Karvonen, PA-C  saccharomyces boulardii (FLORASTOR) 250 MG capsule Take 1 capsule (250 mg total) by mouth 2 (two) times daily. 01/17/15   Barton Dubois, MD  traMADol (ULTRAM) 50 MG tablet Take 1 tablet (50 mg total) by mouth 3 (three) times daily as needed. 12/18/16   Eryka Dolinger, Dannielle Karvonen, PA-C  zolpidem (AMBIEN) 10 MG tablet Take 1 tablet by mouth at bedtime as needed. sleep 03/08/15   [provider]    Allergies Lactose intolerance (gi)  No family history on file.  Social History Social History  Substance Use Topics  . Smoking status: Never Smoker  . Smokeless tobacco: Never Used  . Alcohol use Yes     Comment: 10/15/2014 "I've drank before; nothing regular; don't drink now cause of RX I'm on"    Review of Systems  Constitutional: Negative for fever. Cardiovascular: Negative for chest pain. Respiratory: Negative for shortness of breath. Musculoskeletal: Negative for back pain. Left shoulder pain and left ankle pain. Skin: Negative for rash. Neurological: Negative for headaches, focal weakness or numbness. ____________________________________________  PHYSICAL EXAM:  VITAL SIGNS: ED Triage Vitals  Enc Vitals Group     BP 12/18/16 1008 (!) 145/94     Pulse Rate 12/18/16 1008 (!) 112     Resp  12/18/16 1008 (!) 22     Temp 12/18/16 1008 98.7 F (37.1 C)     Temp Source 12/18/16 1008 Oral     SpO2 12/18/16 1008 94 %     Weight 12/18/16 1009 190 lb (86.2 kg)     Height --      Head Circumference --      Peak Flow --      Pain Score 12/18/16 1008 10     Pain Loc --      Pain Edu? --      Excl. in [place]? --     Constitutional: Alert and oriented. Well appearing and in no distress. Head: Normocephalic and atraumatic. Cardiovascular: Normal rate, regular rhythm. Normal distal pulses. Normal cap refill.  Respiratory: Normal respiratory effort. No wheezes/rales/rhonchi. Musculoskeletal: Left shoulder without obvious deformity, dislocation, or sulcus sign. No obvious bruising, ecchymosis or edema noted. Normal composite fist. Normal  grip strength. Left foot/ankle without obvious deformity, dislocation, or edema. Normal ankle ROM, negative drawer. No calf or achilles tenderness. Nontender with normal range of motion in all extremities.  Neurologic:  Normal gross sensation. Normal speech and language. No gross focal neurologic deficits are appreciated. ___________________________________________   RADIOLOGY  Left Shoulder  Oblique, non-displaced humeral head fracture  Left Foot  Closed, distal fibular fracture. Non-displaced  I, Jahmez Bily, Dannielle Karvonen, personally viewed and evaluated these images (plain radiographs) as part of my medical decision making, as well as reviewing the written report by the radiologist. ____________________________________________  PROCEDURES  Arm sling ____________________________________________  INITIAL IMPRESSION / ASSESSMENT AND PLAN / ED COURSE  Patient with ED evaluation of previously confirmed closed fractures of the left humerus and distal fibula, respectively. Patient injury is more than 38 weeks old by report. She is referred to ortho and podiatry for further fracture management. She should wear her CAM walker and arm sling as provided.  She is discharged with prescriptions for diclofenac, tramadol, and promethazine.  ____________________________________________  FINAL CLINICAL IMPRESSION(S) / ED DIAGNOSES  Final diagnoses:  Closed fracture of distal lateral malleolus of left fibula, initial encounter  Humeral head fracture, left, closed, initial encounter      Melvenia Needles, PA-C 12/18/16 [address] Dannielle Karvonen, PA-C 12/18/16 1654    Lavonia Drafts, MD 12/21/16 [phone]

## 2017-04-08 DIAGNOSIS — M25512 Pain in left shoulder: Secondary | ICD-10-CM | POA: Diagnosis not present

## 2017-04-13 DIAGNOSIS — M25512 Pain in left shoulder: Secondary | ICD-10-CM | POA: Diagnosis not present

## 2017-04-15 DIAGNOSIS — M7502 Adhesive capsulitis of left shoulder: Secondary | ICD-10-CM | POA: Diagnosis not present

## 2017-04-24 DIAGNOSIS — M25612 Stiffness of left shoulder, not elsewhere classified: Secondary | ICD-10-CM | POA: Diagnosis not present

## 2017-04-24 DIAGNOSIS — M25512 Pain in left shoulder: Secondary | ICD-10-CM | POA: Diagnosis not present

## 2017-04-24 DIAGNOSIS — M6281 Muscle weakness (generalized): Secondary | ICD-10-CM | POA: Diagnosis not present

## 2017-05-01 DIAGNOSIS — M25512 Pain in left shoulder: Secondary | ICD-10-CM | POA: Diagnosis not present

## 2017-05-01 DIAGNOSIS — M6281 Muscle weakness (generalized): Secondary | ICD-10-CM | POA: Diagnosis not present

## 2017-05-01 DIAGNOSIS — M25612 Stiffness of left shoulder, not elsewhere classified: Secondary | ICD-10-CM | POA: Diagnosis not present

## 2017-05-03 DIAGNOSIS — M25512 Pain in left shoulder: Secondary | ICD-10-CM | POA: Diagnosis not present

## 2017-05-03 DIAGNOSIS — M25612 Stiffness of left shoulder, not elsewhere classified: Secondary | ICD-10-CM | POA: Diagnosis not present

## 2017-05-03 DIAGNOSIS — M6281 Muscle weakness (generalized): Secondary | ICD-10-CM | POA: Diagnosis not present

## 2017-05-07 DIAGNOSIS — M6281 Muscle weakness (generalized): Secondary | ICD-10-CM | POA: Diagnosis not present

## 2017-05-07 DIAGNOSIS — M25512 Pain in left shoulder: Secondary | ICD-10-CM | POA: Diagnosis not present

## 2017-05-07 DIAGNOSIS — M25612 Stiffness of left shoulder, not elsewhere classified: Secondary | ICD-10-CM | POA: Diagnosis not present

## 2017-05-09 DIAGNOSIS — M25612 Stiffness of left shoulder, not elsewhere classified: Secondary | ICD-10-CM | POA: Diagnosis not present

## 2017-05-09 DIAGNOSIS — M6281 Muscle weakness (generalized): Secondary | ICD-10-CM | POA: Diagnosis not present

## 2017-05-09 DIAGNOSIS — M25512 Pain in left shoulder: Secondary | ICD-10-CM | POA: Diagnosis not present

## 2017-05-13 DIAGNOSIS — M25512 Pain in left shoulder: Secondary | ICD-10-CM | POA: Diagnosis not present

## 2017-05-17 DIAGNOSIS — M25612 Stiffness of left shoulder, not elsewhere classified: Secondary | ICD-10-CM | POA: Diagnosis not present

## 2017-05-17 DIAGNOSIS — M6281 Muscle weakness (generalized): Secondary | ICD-10-CM | POA: Diagnosis not present

## 2017-05-17 DIAGNOSIS — M25512 Pain in left shoulder: Secondary | ICD-10-CM | POA: Diagnosis not present

## 2017-05-21 DIAGNOSIS — M6281 Muscle weakness (generalized): Secondary | ICD-10-CM | POA: Diagnosis not present

## 2017-05-21 DIAGNOSIS — M25512 Pain in left shoulder: Secondary | ICD-10-CM | POA: Diagnosis not present

## 2017-05-21 DIAGNOSIS — M25612 Stiffness of left shoulder, not elsewhere classified: Secondary | ICD-10-CM | POA: Diagnosis not present

## 2017-05-23 DIAGNOSIS — M25512 Pain in left shoulder: Secondary | ICD-10-CM | POA: Diagnosis not present

## 2017-05-23 DIAGNOSIS — M25612 Stiffness of left shoulder, not elsewhere classified: Secondary | ICD-10-CM | POA: Diagnosis not present

## 2017-05-23 DIAGNOSIS — M6281 Muscle weakness (generalized): Secondary | ICD-10-CM | POA: Diagnosis not present

## 2017-06-04 DIAGNOSIS — M25612 Stiffness of left shoulder, not elsewhere classified: Secondary | ICD-10-CM | POA: Diagnosis not present

## 2017-06-04 DIAGNOSIS — M25512 Pain in left shoulder: Secondary | ICD-10-CM | POA: Diagnosis not present

## 2017-06-04 DIAGNOSIS — M6281 Muscle weakness (generalized): Secondary | ICD-10-CM | POA: Diagnosis not present

## 2017-06-06 DIAGNOSIS — M25512 Pain in left shoulder: Secondary | ICD-10-CM | POA: Diagnosis not present

## 2017-06-06 DIAGNOSIS — M6281 Muscle weakness (generalized): Secondary | ICD-10-CM | POA: Diagnosis not present

## 2017-06-06 DIAGNOSIS — M25612 Stiffness of left shoulder, not elsewhere classified: Secondary | ICD-10-CM | POA: Diagnosis not present

## 2017-06-12 DIAGNOSIS — M6281 Muscle weakness (generalized): Secondary | ICD-10-CM | POA: Diagnosis not present

## 2017-06-12 DIAGNOSIS — M25612 Stiffness of left shoulder, not elsewhere classified: Secondary | ICD-10-CM | POA: Diagnosis not present

## 2017-06-12 DIAGNOSIS — M25512 Pain in left shoulder: Secondary | ICD-10-CM | POA: Diagnosis not present

## 2017-06-14 DIAGNOSIS — M25612 Stiffness of left shoulder, not elsewhere classified: Secondary | ICD-10-CM | POA: Diagnosis not present

## 2017-06-14 DIAGNOSIS — M6281 Muscle weakness (generalized): Secondary | ICD-10-CM | POA: Diagnosis not present

## 2017-06-14 DIAGNOSIS — M25512 Pain in left shoulder: Secondary | ICD-10-CM | POA: Diagnosis not present

## 2017-06-19 DIAGNOSIS — M25512 Pain in left shoulder: Secondary | ICD-10-CM | POA: Diagnosis not present

## 2017-06-19 DIAGNOSIS — M6281 Muscle weakness (generalized): Secondary | ICD-10-CM | POA: Diagnosis not present

## 2017-06-19 DIAGNOSIS — M25612 Stiffness of left shoulder, not elsewhere classified: Secondary | ICD-10-CM | POA: Diagnosis not present

## 2017-06-24 DIAGNOSIS — M7582 Other shoulder lesions, left shoulder: Secondary | ICD-10-CM | POA: Diagnosis not present

## 2017-06-24 DIAGNOSIS — M25512 Pain in left shoulder: Secondary | ICD-10-CM | POA: Diagnosis not present

## 2017-06-24 DIAGNOSIS — M25612 Stiffness of left shoulder, not elsewhere classified: Secondary | ICD-10-CM | POA: Diagnosis not present

## 2017-06-24 DIAGNOSIS — M6281 Muscle weakness (generalized): Secondary | ICD-10-CM | POA: Diagnosis not present

## 2017-06-26 DIAGNOSIS — M25612 Stiffness of left shoulder, not elsewhere classified: Secondary | ICD-10-CM | POA: Diagnosis not present

## 2017-06-26 DIAGNOSIS — M25512 Pain in left shoulder: Secondary | ICD-10-CM | POA: Diagnosis not present

## 2017-06-26 DIAGNOSIS — M6281 Muscle weakness (generalized): Secondary | ICD-10-CM | POA: Diagnosis not present

## 2017-06-27 DIAGNOSIS — R112 Nausea with vomiting, unspecified: Secondary | ICD-10-CM | POA: Diagnosis not present

## 2017-06-27 DIAGNOSIS — R1084 Generalized abdominal pain: Secondary | ICD-10-CM | POA: Diagnosis not present

## 2017-06-27 DIAGNOSIS — F419 Anxiety disorder, unspecified: Secondary | ICD-10-CM | POA: Diagnosis not present

## 2017-06-27 DIAGNOSIS — K922 Gastrointestinal hemorrhage, unspecified: Secondary | ICD-10-CM | POA: Diagnosis not present

## 2017-06-27 DIAGNOSIS — R109 Unspecified abdominal pain: Secondary | ICD-10-CM | POA: Diagnosis not present

## 2017-06-28 ENCOUNTER — Emergency Department: Payer: Medicare Other

## 2017-06-28 ENCOUNTER — Emergency Department
Admission: EM | Admit: 2017-06-28 | Discharge: 2017-06-28 | Disposition: A | Discharge status: Home / Self Care | Payer: Medicare Other | Attending: Emergency Medicine | Admitting: Emergency Medicine

## 2017-06-28 ENCOUNTER — Other Ambulatory Visit: Payer: Self-pay

## 2017-06-28 DIAGNOSIS — R112 Nausea with vomiting, unspecified: Secondary | ICD-10-CM | POA: Diagnosis not present

## 2017-06-28 DIAGNOSIS — Z79899 Other long term (current) drug therapy: Secondary | ICD-10-CM | POA: Insufficient documentation

## 2017-06-28 DIAGNOSIS — I10 Essential (primary) hypertension: Secondary | ICD-10-CM | POA: Insufficient documentation

## 2017-06-28 DIAGNOSIS — Z85828 Personal history of other malignant neoplasm of skin: Secondary | ICD-10-CM | POA: Diagnosis not present

## 2017-06-28 DIAGNOSIS — R1084 Generalized abdominal pain: Secondary | ICD-10-CM | POA: Diagnosis not present

## 2017-06-28 DIAGNOSIS — R1110 Vomiting, unspecified: Secondary | ICD-10-CM | POA: Diagnosis present

## 2017-06-28 DIAGNOSIS — R109 Unspecified abdominal pain: Secondary | ICD-10-CM | POA: Diagnosis not present

## 2017-06-28 DIAGNOSIS — N179 Acute kidney failure, unspecified: Secondary | ICD-10-CM | POA: Diagnosis not present

## 2017-06-28 LAB — URINE DRUG SCREEN, QUALITATIVE (ARMC ONLY)
Amphetamines, Ur Screen: NOT DETECTED
Barbiturates, Ur Screen: NOT DETECTED
Benzodiazepine, Ur Scrn: NOT DETECTED
CANNABINOID 50 NG, UR ~~LOC~~: NOT DETECTED
Cocaine Metabolite,Ur ~~LOC~~: NOT DETECTED
MDMA (ECSTASY) UR SCREEN: NOT DETECTED
Methadone Scn, Ur: NOT DETECTED
Opiate, Ur Screen: POSITIVE — AB
PHENCYCLIDINE (PCP) UR S: NOT DETECTED
Tricyclic, Ur Screen: POSITIVE — AB

## 2017-06-28 LAB — COMPREHENSIVE METABOLIC PANEL
ALBUMIN: 3.9 g/dL (ref 3.5–5.0)
ALT: 15 U/L (ref 14–54)
ANION GAP: 17 — AB (ref 5–15)
AST: 47 U/L — AB (ref 15–41)
Alkaline Phosphatase: 108 U/L (ref 38–126)
BUN: 5 mg/dL — AB (ref 6–20)
CHLORIDE: 103 mmol/L (ref 101–111)
CO2: 18 mmol/L — AB (ref 22–32)
Calcium: 8.5 mg/dL — ABNORMAL LOW (ref 8.9–10.3)
Creatinine, Ser: 0.89 mg/dL (ref 0.44–1.00)
GFR calc Af Amer: >60 mL/min (ref >60)
GFR calc non Af Amer: >60 mL/min (ref >60)
GLUCOSE: 166 mg/dL — AB (ref 65–99)
POTASSIUM: 3.8 mmol/L (ref 3.5–5.1)
SODIUM: 138 mmol/L (ref 135–145)
Total Bilirubin: 0.7 mg/dL (ref 0.3–1.2)
Total Protein: 8.5 g/dL — ABNORMAL HIGH (ref 6.5–8.1)

## 2017-06-28 LAB — URINALYSIS, COMPLETE (UACMP) WITH MICROSCOPIC
BACTERIA UA: NONE SEEN
BILIRUBIN URINE: NEGATIVE
Glucose, UA: NEGATIVE mg/dL
Hgb urine dipstick: NEGATIVE
KETONES UR: 20 mg/dL — AB
LEUKOCYTES UA: NEGATIVE
NITRITE: NEGATIVE
PH: 5 (ref 5.0–8.0)
PROTEIN: NEGATIVE mg/dL
Specific Gravity, Urine: 1.033 — ABNORMAL HIGH (ref 1.005–1.030)

## 2017-06-28 LAB — CBC WITH DIFFERENTIAL/PLATELET
BASOS ABS: 0 10*3/uL (ref 0–0.1)
BASOS PCT: 0 %
EOS ABS: 0 10*3/uL (ref 0–0.7)
Eosinophils Relative: 0 %
HCT: 34.7 % — ABNORMAL LOW (ref 35.0–47.0)
Hemoglobin: 11 g/dL — ABNORMAL LOW (ref 12.0–16.0)
Lymphocytes Relative: 6 %
Lymphs Abs: 0.9 10*3/uL — ABNORMAL LOW (ref 1.0–3.6)
MCH: 23.2 pg — ABNORMAL LOW (ref 26.0–34.0)
MCHC: 31.7 g/dL — AB (ref 32.0–36.0)
MCV: 73 fL — ABNORMAL LOW (ref 80.0–100.0)
MONOS PCT: 6 %
Monocytes Absolute: 1 10*3/uL — ABNORMAL HIGH (ref 0.2–0.9)
NEUTROS ABS: 13.4 10*3/uL — AB (ref 1.4–6.5)
Neutrophils Relative %: 88 %
Platelets: 451 10*3/uL — ABNORMAL HIGH (ref 150–440)
RBC: 4.76 MIL/uL (ref 3.80–5.20)
RDW: 19.6 % — AB (ref 11.5–14.5)
WBC: 15.2 10*3/uL — ABNORMAL HIGH (ref 3.6–11.0)

## 2017-06-28 LAB — LIPASE, BLOOD: Lipase: 21 U/L (ref 11–51)

## 2017-06-28 MED ORDER — DIPHENHYDRAMINE HCL 50 MG/ML IJ SOLN
INTRAMUSCULAR | Status: AC
  Filled 2017-06-28: qty 1

## 2017-06-28 MED ORDER — DIPHENHYDRAMINE HCL 50 MG/ML IJ SOLN
25.0000 mg | Freq: Once | INTRAMUSCULAR | Status: AC
  Administered 2017-06-28: 25 mg via INTRAVENOUS

## 2017-06-28 MED ORDER — FENTANYL CITRATE (PF) 100 MCG/2ML IJ SOLN
50.0000 ug | Freq: Once | INTRAMUSCULAR | Status: AC
Start: 2017-06-28 — End: 2017-06-28
  Administered 2017-06-28: 50 ug via INTRAVENOUS
  Filled 2017-06-28: qty 2

## 2017-06-28 MED ORDER — FAMOTIDINE IN NACL 20-0.9 MG/50ML-% IV SOLN
20.0000 mg | Freq: Once | INTRAVENOUS | Status: AC
  Administered 2017-06-28: 20 mg via INTRAVENOUS
  Filled 2017-06-28: qty 50

## 2017-06-28 MED ORDER — LORAZEPAM 2 MG/ML IJ SOLN
1.0000 mg | Freq: Once | INTRAMUSCULAR | Status: AC
  Administered 2017-06-28: 1 mg via INTRAVENOUS
  Filled 2017-06-28: qty 1

## 2017-06-28 MED ORDER — HALOPERIDOL LACTATE 5 MG/ML IJ SOLN
5.0000 mg | Freq: Once | INTRAMUSCULAR | Status: AC
  Administered 2017-06-28: 5 mg via INTRAVENOUS
  Filled 2017-06-28: qty 1

## 2017-06-28 MED ORDER — ONDANSETRON HCL 4 MG/2ML IJ SOLN
4.0000 mg | Freq: Once | INTRAMUSCULAR | Status: AC
  Filled 2017-06-28: qty 2

## 2017-06-28 MED ORDER — MAGNESIUM CITRATE PO SOLN
0.5000 | Freq: Once | ORAL | Status: AC
  Administered 2017-06-28: 0.5 via ORAL
  Filled 2017-06-28: qty 296

## 2017-06-28 NOTE — ED Notes (Signed)
Bladder Scan= 122 

## 2017-06-28 NOTE — ED Notes (Signed)
Pt taken to lobby in wheelchair. Discharge instructions and follow up discussed with pt.

## 2017-06-28 NOTE — ED Triage Notes (Signed)
Pt here with vomiting and abd pain since yesterday. Was just seen at [facility] ED and had full work up including CT scan which was negative. Was discharged home to follow up with GI and took prn meds without relief. Pt vomiting in triage.

## 2017-06-28 NOTE — ED Notes (Signed)
Pt states that she has been feeling bad for past two days. Pt states she cant' pee and her abdomin is hurting her. She has also had a lot of nausea and vomiting that she is dealing with currently.  Pt is screaming at top of lungs and when asked why she stated that she has anxiety. This RN asked pt to take some deep breathes to calm down.

## 2017-06-28 NOTE — ED Provider Notes (Signed)
[facility] Emergency Department Provider Note   ____________________________________________   First MD Initiated Contact with Patient 06/28/17 0309     (approximate)  I have reviewed the triage vital signs and the nursing notes.   HISTORY  Chief Complaint Emesis  Level V caveat: History limited secondary to patient histrionic  HPI [name] is a 47 y.o. female who presents to the ED from home with a chief complaint of abdominal pain and vomiting.  Patient has a history of intractable abdominal pain who was just seen last evening at [facility] ED for same.  Reports generalized abdominal pain associated with vomiting, no diarrhea.  She is able to tell me that she had a negative CT scan last evening.  Rest of history is unobtainable secondary to patient distress.   Past Medical History:  Diagnosis Date  . Anxiety   . Arthritis    "joints ache all over" (10/15/2014)  . Bulging lumbar disc   . Chronic lower back pain   . DDD (degenerative disc disease), cervical   . Depression   . Drug-seeking behavior   . Headache    "weekly" (10/15/2014)  . Hyperlipemia   . Hypertension   . PTSD (post-traumatic stress disorder)   . Skin cancer    "had them cut off my arms; don't know what kind"    Patient Active Problem List   Diagnosis Date Noted  . Acute gastroenteritis 11/19/2015  . Hypomagnesemia   . AKI (acute kidney injury) ([place])   . C. difficile colitis   . SIRS (systemic inflammatory response syndrome) ([place]) 01/11/2015  . Cellulitis 01/03/2015  . Tachycardia   . Dyspnea   . Opiate withdrawal ([place]) 12/18/2014  . Depression 12/17/2014  . Hypotension 12/16/2014  . Herpes simplex virus type 1 (HSV-1) dermatitis   . Sacral fracture ([place]) 12/12/2014  . Fall 12/12/2014  . Nausea and vomiting 12/12/2014  . Leukocytosis 12/12/2014  . Chronic lower back pain 12/12/2014  . Sacral fracture, closed ([place]) 12/12/2014  . Shingles outbreak 12/12/2014  .  Nausea with vomiting   . Intractable abdominal pain 10/15/2014  . Abdominal pain 10/15/2014  . Hypokalemia 09/04/2014  . Sepsis ([place]) 09/01/2014  . Abdominal pain, generalized 09/01/2014  . PTSD (post-traumatic stress disorder) 09/01/2014  . Endometriosis 09/01/2014  . Nausea vomiting and diarrhea 09/01/2014  . Sinus tachycardia 09/01/2014  . Lactic acidosis 09/01/2014  . Severe sepsis ([place]) 09/01/2014    Past Surgical History:  Procedure Laterality Date  . ABLATION ON ENDOMETRIOSIS  2008  . HEMORRHOID SURGERY  ~ 2002    Prior to Admission medications   Medication Sig Start Date End Date Taking? Authorizing Provider  amLODipine (NORVASC) 10 MG tablet Take 1 tablet (10 mg total) by mouth daily. Patient not taking: Reported on 03/13/2015 10/17/14   Kelvin Cellar, MD  diclofenac (VOLTAREN) 50 MG EC tablet Take 1 tablet (50 mg total) by mouth 2 (two) times daily. 12/18/16   Menshew, Dannielle Karvonen, PA-C  dicyclomine (BENTYL) 10 MG capsule Take 2 capsules (20 mg total) by mouth 3 (three) times daily before meals. 10/10/15   Hower, Aaron Mose, MD  famotidine (PEPCID) 20 MG tablet Take 1 tablet (20 mg total) by mouth 2 (two) times daily. 10/17/15   Carrie Mew, MD  FLUoxetine (PROZAC) 20 MG capsule Take 20 mg by mouth daily.    [provider]  HYDROcodone-acetaminophen (NORCO) 10-325 MG per tablet Take 1 tablet by mouth every 8 (eight) hours as needed  for moderate pain. 01/17/15   Barton Dubois, MD  loperamide (IMODIUM A-D) 2 MG tablet Take 2 tablets (4 mg total) by mouth 4 (four) times daily as needed for diarrhea or loose stools. 10/17/15   Carrie Mew, MD  omeprazole (PRILOSEC) 20 MG capsule Take 1 capsule (20 mg total) by mouth daily. 03/13/15   Marella Chimes, PA-C  ondansetron (ZOFRAN ODT) 4 MG disintegrating tablet Take 1 tablet (4 mg total) by mouth every 8 (eight) hours as needed for nausea or vomiting. 11/28/15   Harvest Dark, MD  promethazine  (PHENERGAN) 25 MG tablet Take 1 tablet (25 mg total) by mouth every 8 (eight) hours as needed for nausea or vomiting. 12/18/16   Menshew, Dannielle Karvonen, PA-C  saccharomyces boulardii (FLORASTOR) 250 MG capsule Take 1 capsule (250 mg total) by mouth 2 (two) times daily. 01/17/15   Barton Dubois, MD  traMADol (ULTRAM) 50 MG tablet Take 1 tablet (50 mg total) by mouth 3 (three) times daily as needed. 12/18/16   Menshew, Dannielle Karvonen, PA-C  zolpidem (AMBIEN) 10 MG tablet Take 1 tablet by mouth at bedtime as needed. sleep 03/08/15   [provider]    Allergies Lactose intolerance (gi)  No family history on file.  Social History Social History   Tobacco Use  . Smoking status: Never Smoker  . Smokeless tobacco: Never Used  Substance Use Topics  . Alcohol use: Yes    Comment: 10/15/2014 "I've drank before; nothing regular; don't drink now cause of RX I'm on"  . Drug use: No    Review of Systems  Constitutional: No fever/chills. Eyes: No visual changes. ENT: No sore throat. Cardiovascular: Denies chest pain. Respiratory: Denies shortness of breath. Gastrointestinal: Positive for abdominal pain, nausea and vomiting.  No diarrhea.  No constipation. Genitourinary: Negative for dysuria. Musculoskeletal: Negative for back pain. Skin: Negative for rash. Neurological: Negative for headaches, focal weakness or numbness.   ____________________________________________   PHYSICAL EXAM:  VITAL SIGNS: ED Triage Vitals  Enc Vitals Group     BP 06/28/17 0250 (!) 165/134     Pulse Rate 06/28/17 0250 (!) 131     Resp 06/28/17 0250 20     Temp 06/28/17 0250 98.5 F (36.9 C)     Temp Source 06/28/17 0250 Oral     SpO2 06/28/17 0250 96 %     Weight 06/28/17 0251 198 lb (89.8 kg)     Height 06/28/17 0251 5\' 4"  (1.626 m)     Head Circumference --      Peak Flow --      Pain Score 06/28/17 0250 8     Pain Loc --      Pain Edu? --      Excl. in [place]? --     Constitutional: Alert  and oriented. Well appearing and in moderate acute distress. Eyes: Conjunctivae are normal. PERRL. EOMI. Head: Atraumatic. Nose: No congestion/rhinnorhea. Mouth/Throat: Mucous membranes are moist.  Oropharynx non-erythematous. Neck: No stridor.  No carotid bruits. Cardiovascular: Normal rate, regular rhythm. Grossly normal heart sounds.  Good peripheral circulation. Respiratory: Normal respiratory effort.  No retractions. Lungs CTAB. Gastrointestinal: Soft and mildly diffusely tender without rebound or guarding. No distention. No abdominal bruits. No CVA tenderness. Musculoskeletal: No lower extremity tenderness nor edema.  No joint effusions. Neurologic:  Normal speech and language. No gross focal neurologic deficits are appreciated.  Skin:  Skin is warm, dry and intact. No rash noted. Psychiatric: Mood and affect are anxious, hysterical.  Speech and behavior are normal.  ____________________________________________   LABS (all labs ordered are listed, but only abnormal results are displayed)  Labs Reviewed  CBC WITH DIFFERENTIAL/PLATELET - Abnormal; Notable for the following components:      Result Value   WBC 15.2 (*)    Hemoglobin 11.0 (*)    HCT 34.7 (*)    MCV 73.0 (*)    MCH 23.2 (*)    MCHC 31.7 (*)    RDW 19.6 (*)    Platelets 451 (*)    Neutro Abs 13.4 (*)    Lymphs Abs 0.9 (*)    Monocytes Absolute 1.0 (*)    All other components within normal limits  COMPREHENSIVE METABOLIC PANEL - Abnormal; Notable for the following components:   CO2 18 (*)    Glucose, Bld 166 (*)    BUN 5 (*)    Calcium 8.5 (*)    Total Protein 8.5 (*)    AST 47 (*)    Anion gap 17 (*)    All other components within normal limits  URINALYSIS, COMPLETE (UACMP) WITH MICROSCOPIC - Abnormal; Notable for the following components:   Color, Urine YELLOW (*)    APPearance HAZY (*)    Specific Gravity, Urine 1.033 (*)    Ketones, ur 20 (*)    Squamous Epithelial / LPF 0-5 (*)    All other  components within normal limits  LIPASE, BLOOD  URINE DRUG SCREEN, QUALITATIVE ([facility] ONLY)   ____________________________________________  EKG  None ____________________________________________  RADIOLOGY  ED MD interpretation: I personally reviewed patient's x-rays; moderate stool burden noted.  Nonobstructive.  Official radiology report(s): Dg Abd Acute W/chest  Result Date: 06/28/2017 CLINICAL DATA:  Abdominal pain, nausea/vomiting x several days. EXAM: DG ABDOMEN ACUTE W/ 1V CHEST COMPARISON:  CT abdomen/pelvis dated 06/27/2017. Chest radiographs dated 01/22/2016. FINDINGS: Mild bibasilar opacities, likely atelectasis. No focal consolidation. No pleural effusion or pneumothorax. The heart is normal in size. Nonobstructive bowel gas pattern. No evidence of free air under the diaphragm on the upright view. Visualized osseous structures are within normal limits. Excretory contrast in the bladder. IMPRESSION: No evidence of acute cardiopulmonary disease. No evidence of small bowel obstruction or free air. Electronically Signed   By: Julian Hy M.D.   On: 06/28/2017 07:04    ____________________________________________   PROCEDURES  Procedure(s) performed: None  Procedures  Critical Care performed: No  ____________________________________________   INITIAL IMPRESSION / ASSESSMENT AND PLAN / ED COURSE  As part of my medical decision making, I reviewed the following data within the [place] notes reviewed and incorporated, Labs reviewed, Old chart reviewed, Radiograph reviewed  and Notes from prior ED visits   47 year old female with a history of intractable abdominal pain who presents with abdominal pain, nausea and vomiting.  Seen last evening at [facility] for same with negative CT scan. Differential diagnosis includes, but is not limited to, biliary disease (biliary colic, acute cholecystitis, cholangitis, choledocholithiasis, etc),  intrathoracic causes for epigastric abdominal pain including ACS, gastritis, duodenitis, pancreatitis, small bowel or large bowel obstruction, abdominal aortic aneurysm, hernia, and gastritis.  Will obtain basic lab work and urinalysis, initiate IV fluid resuscitation, Haldol for refractory nausea.  Will obtain patient's records from last evening's ED visit at [facility].  Clinical Course as of Jun 28 749  Fri Jun 28, 2017  0418 Unable to find patient's records from care everywhere.  Will call [facility] for patient's records.  Will obtain upright portable chest  x-ray to evaluate for perforated viscus.  Bladder scan revealed approximately 100 mL.  [JS]  [phone] Records received from [facility].  Patient did have CT abdomen/pelvis with contrast which was unremarkable.  WBC 16.9.  Hemoglobin 11.5.  Hematocrit 36.  Physician's note mention that patient is seen frequently at their facility for abdominal pain, vomiting and rectal bleeding.  She was discharged home with Zofran, Bentyl, hydroxyzine and encouraged to follow-up with GI.  [JS]  [phone] Patient resting more comfortably.  She was able to void.  Postvoid residual 22 mL.  Updated her on x-ray imaging results.  Will administer soapsuds enema.  Anticipate patient would be able to be discharged home once she has some relief.  At this time care is transferred to Dr. Archie Balboa pending reassessment and disposition.  [JS]    Clinical Course User Index [JS] Paulette Blanch, MD     ____________________________________________   FINAL CLINICAL IMPRESSION(S) / ED DIAGNOSES  Final diagnoses:  Generalized abdominal pain  Non-intractable vomiting with nausea, unspecified vomiting type     ED Discharge Orders    None       Note:  This document was prepared using Dragon voice recognition software and may include unintentional dictation errors.    Paulette Blanch, MD 06/28/17 [phone]

## 2017-06-28 NOTE — Discharge Instructions (Signed)
Please seek medical attention for any high fevers, chest pain, shortness of breath, change in behavior, persistent vomiting, bloody stool or any other new or concerning symptoms.  

## 2017-06-28 NOTE — ED Notes (Signed)
Soap suds enema given to pt. She was able to hold it for a little bit. Clear liquid returned in toilet. Pt is nauseated. MD aware orders given.

## 2017-07-01 ENCOUNTER — Emergency Department
Admission: EM | Admit: 2017-07-01 | Discharge: 2017-07-01 | Disposition: A | Discharge status: Home / Self Care | Payer: Medicare Other | Attending: Emergency Medicine | Admitting: Emergency Medicine

## 2017-07-01 ENCOUNTER — Other Ambulatory Visit: Payer: Self-pay

## 2017-07-01 ENCOUNTER — Encounter: Payer: Self-pay | Admitting: Emergency Medicine

## 2017-07-01 ENCOUNTER — Emergency Department: Payer: Medicare Other

## 2017-07-01 DIAGNOSIS — K5901 Slow transit constipation: Secondary | ICD-10-CM

## 2017-07-01 DIAGNOSIS — I10 Essential (primary) hypertension: Secondary | ICD-10-CM | POA: Diagnosis not present

## 2017-07-01 DIAGNOSIS — Z85828 Personal history of other malignant neoplasm of skin: Secondary | ICD-10-CM | POA: Diagnosis not present

## 2017-07-01 DIAGNOSIS — Z79899 Other long term (current) drug therapy: Secondary | ICD-10-CM | POA: Diagnosis not present

## 2017-07-01 DIAGNOSIS — K5900 Constipation, unspecified: Secondary | ICD-10-CM | POA: Diagnosis not present

## 2017-07-01 LAB — COMPREHENSIVE METABOLIC PANEL
ALBUMIN: 4.3 g/dL (ref 3.5–5.0)
ALT: 16 U/L (ref 14–54)
ANION GAP: 15 (ref 5–15)
AST: 20 U/L (ref 15–41)
Alkaline Phosphatase: 96 U/L (ref 38–126)
BILIRUBIN TOTAL: 0.7 mg/dL (ref 0.3–1.2)
BUN: 8 mg/dL (ref 6–20)
CALCIUM: 8.9 mg/dL (ref 8.9–10.3)
CO2: 22 mmol/L (ref 22–32)
Chloride: 99 mmol/L — ABNORMAL LOW (ref 101–111)
Creatinine, Ser: 1.01 mg/dL — ABNORMAL HIGH (ref 0.44–1.00)
GFR calc Af Amer: >60 mL/min (ref >60)
GLUCOSE: 121 mg/dL — AB (ref 65–99)
POTASSIUM: 3.1 mmol/L — AB (ref 3.5–5.1)
Sodium: 136 mmol/L (ref 135–145)
TOTAL PROTEIN: 8.7 g/dL — AB (ref 6.5–8.1)

## 2017-07-01 LAB — CBC
HEMATOCRIT: 37.2 % (ref 35.0–47.0)
HEMOGLOBIN: 11.7 g/dL — AB (ref 12.0–16.0)
MCH: 22.8 pg — ABNORMAL LOW (ref 26.0–34.0)
MCHC: 31.5 g/dL — AB (ref 32.0–36.0)
MCV: 72.3 fL — ABNORMAL LOW (ref 80.0–100.0)
Platelets: 536 10*3/uL — ABNORMAL HIGH (ref 150–440)
RBC: 5.14 MIL/uL (ref 3.80–5.20)
RDW: 19.4 % — AB (ref 11.5–14.5)
WBC: 13.5 10*3/uL — ABNORMAL HIGH (ref 3.6–11.0)

## 2017-07-01 LAB — URINALYSIS, COMPLETE (UACMP) WITH MICROSCOPIC
BILIRUBIN URINE: NEGATIVE
Bacteria, UA: NONE SEEN
Glucose, UA: NEGATIVE mg/dL
HGB URINE DIPSTICK: NEGATIVE
KETONES UR: 5 mg/dL — AB
Leukocytes, UA: NEGATIVE
NITRITE: NEGATIVE
PH: 7 (ref 5.0–8.0)
Protein, ur: NEGATIVE mg/dL
SPECIFIC GRAVITY, URINE: 1.013 (ref 1.005–1.030)

## 2017-07-01 LAB — LIPASE, BLOOD: Lipase: 21 U/L (ref 11–51)

## 2017-07-01 LAB — POCT PREGNANCY, URINE: Preg Test, Ur: NEGATIVE

## 2017-07-01 MED ORDER — POLYETHYLENE GLYCOL 3350 17 G PO PACK: 17.0000 g | PACK | Freq: Every day | ORAL | 0 refills | Status: DC

## 2017-07-01 MED ORDER — MAGNESIUM CITRATE PO SOLN
1.0000 | Freq: Once | ORAL | Status: AC
  Administered 2017-07-01: 1 via ORAL
  Filled 2017-07-01: qty 296

## 2017-07-01 NOTE — ED Triage Notes (Signed)
Difficulty urinating, difficulty moving bowels, sob.  Seen multiple times for same complaints.  Has called GI for follow up, waiting to hear back from them this afternoon for appointment.

## 2017-07-01 NOTE — ED Provider Notes (Signed)
[facility] Emergency Department Provider Note   ____________________________________________    I have reviewed the triage vital signs and the nursing notes.   HISTORY  Chief Complaint Bloating    HPI [name] is a 47 y.o. female who presents with complaints of constipation.  Patient reports she has not had a bowel movement in 3-4 days.  She reports she feels bloated but denies abdominal pain.  She has tried stool softeners with little improvement.  Denies nausea or vomiting.  No fevers or chills.  Denies a history of constipation   Past Medical History:  Diagnosis Date  . Anxiety   . Arthritis    "joints ache all over" (10/15/2014)  . Bulging lumbar disc   . Chronic lower back pain   . DDD (degenerative disc disease), cervical   . Depression   . Drug-seeking behavior   . Headache    "weekly" (10/15/2014)  . Hyperlipemia   . Hypertension   . PTSD (post-traumatic stress disorder)   . Skin cancer    "had them cut off my arms; don't know what kind"    Patient Active Problem List   Diagnosis Date Noted  . Acute gastroenteritis 11/19/2015  . Hypomagnesemia   . AKI (acute kidney injury) ([place])   . C. difficile colitis   . SIRS (systemic inflammatory response syndrome) ([place]) 01/11/2015  . Cellulitis 01/03/2015  . Tachycardia   . Dyspnea   . Opiate withdrawal ([place]) 12/18/2014  . Depression 12/17/2014  . Hypotension 12/16/2014  . Herpes simplex virus type 1 (HSV-1) dermatitis   . Sacral fracture ([place]) 12/12/2014  . Fall 12/12/2014  . Nausea and vomiting 12/12/2014  . Leukocytosis 12/12/2014  . Chronic lower back pain 12/12/2014  . Sacral fracture, closed ([place]) 12/12/2014  . Shingles outbreak 12/12/2014  . Nausea with vomiting   . Intractable abdominal pain 10/15/2014  . Abdominal pain 10/15/2014  . Hypokalemia 09/04/2014  . Sepsis ([place]) 09/01/2014  . Abdominal pain, generalized 09/01/2014  . PTSD (post-traumatic stress  disorder) 09/01/2014  . Endometriosis 09/01/2014  . Nausea vomiting and diarrhea 09/01/2014  . Sinus tachycardia 09/01/2014  . Lactic acidosis 09/01/2014  . Severe sepsis ([place]) 09/01/2014    Past Surgical History:  Procedure Laterality Date  . ABLATION ON ENDOMETRIOSIS  2008  . HEMORRHOID SURGERY  ~ 2002    Prior to Admission medications   Medication Sig Start Date End Date Taking? Authorizing Provider  famotidine (PEPCID) 20 MG tablet Take 1 tablet (20 mg total) by mouth 2 (two) times daily. 10/17/15  Yes Carrie Mew, MD  FLUoxetine (PROZAC) 40 MG capsule Take 1 capsule by mouth daily. 06/26/17  Yes [provider]  hydrochlorothiazide (HYDRODIURIL) 25 MG tablet Take 1 tablet by mouth daily. 06/26/17  Yes [provider]  lamoTRIgine (LAMICTAL) 100 MG tablet Take 1 tablet by mouth 2 (two) times daily. 06/13/17  Yes [provider]  loperamide (IMODIUM A-D) 2 MG tablet Take 2 tablets (4 mg total) by mouth 4 (four) times daily as needed for diarrhea or loose stools. 10/17/15  Yes Carrie Mew, MD  omeprazole (PRILOSEC) 20 MG capsule Take 1 capsule (20 mg total) by mouth daily. 03/13/15  Yes Westfall, Elizabeth C, PA-C  ondansetron (ZOFRAN ODT) 4 MG disintegrating tablet Take 1 tablet (4 mg total) by mouth every 8 (eight) hours as needed for nausea or vomiting. 11/28/15  Yes Harvest Dark, MD  promethazine (PHENERGAN) 25 MG tablet Take 1 tablet (25 mg total)  by mouth every 8 (eight) hours as needed for nausea or vomiting. 12/18/16  Yes Menshew, Dannielle Karvonen, PA-C  traZODone (DESYREL) 100 MG tablet Take 1 tablet by mouth daily. 06/03/17  Yes [provider]  amLODipine (NORVASC) 10 MG tablet Take 1 tablet (10 mg total) by mouth daily. Patient not taking: Reported on 07/01/2017 10/17/14   Kelvin Cellar, MD  diclofenac (VOLTAREN) 50 MG EC tablet Take 1 tablet (50 mg total) by mouth 2 (two) times daily. Patient not taking: Reported on 06/28/2017 12/18/16    Menshew, Dannielle Karvonen, PA-C  dicyclomine (BENTYL) 10 MG capsule Take 2 capsules (20 mg total) by mouth 3 (three) times daily before meals. Patient not taking: Reported on 06/28/2017 10/10/15   Hower, Aaron Mose, MD  HYDROcodone-acetaminophen [facility]) 10-325 MG per tablet Take 1 tablet by mouth every 8 (eight) hours as needed for moderate pain. Patient not taking: Reported on 06/28/2017 01/17/15   Barton Dubois, MD  polyethylene glycol [facility]) packet Take 17 g by mouth daily. 07/01/17   Lavonia Drafts, MD  saccharomyces boulardii (FLORASTOR) 250 MG capsule Take 1 capsule (250 mg total) by mouth 2 (two) times daily. Patient not taking: Reported on 06/28/2017 01/17/15   Barton Dubois, MD  traMADol (ULTRAM) 50 MG tablet Take 1 tablet (50 mg total) by mouth 3 (three) times daily as needed. Patient not taking: Reported on 06/28/2017 12/18/16   Menshew, Dannielle Karvonen, PA-C     Allergies Lactose intolerance (gi)  No family history on file.  Social History Social History   Tobacco Use  . Smoking status: Never Smoker  . Smokeless tobacco: Never Used  Substance Use Topics  . Alcohol use: Yes    Comment: 10/15/2014 "I've drank before; nothing regular; don't drink now cause of RX I'm on"  . Drug use: No    Review of Systems  Constitutional: No fever Eyes: No visual changes.  ENT: No sore throat. Cardiovascular: Denies chest pain. Respiratory: Denies shortness of breath. Gastrointestinal: As above Genitourinary: Negative for dysuria. Musculoskeletal: Negative for back pain. Skin: Negative for rash. Neurological: Negative for headaches    ____________________________________________   PHYSICAL EXAM:  VITAL SIGNS: ED Triage Vitals  Enc Vitals Group     BP 07/01/17 0928 (!) 146/93     Pulse Rate 07/01/17 0928 (!) 114     Resp 07/01/17 1329 16     Temp 07/01/17 0928 98.3 F (36.8 C)     Temp Source 07/01/17 0928 Oral     SpO2 07/01/17 0928 95 %     Weight 07/01/17 0927 89.8 kg (198 lb)      Height 07/01/17 0927 1.626 m (5\' 4" )     Head Circumference --      Peak Flow --      Pain Score 07/01/17 0927 0     Pain Loc --      Pain Edu? --      Excl. in [place]? --     Constitutional: Alert and oriented. No acute distress.  Eyes: Conjunctivae are normal.   Nose: No congestion/rhinnorhea. Mouth/Throat: Mucous membranes are moist.    Cardiovascular: Normal rate, regular rhythm. Grossly normal heart sounds.  Good peripheral circulation. Respiratory: Normal respiratory effort.  No retractions.  Gastrointestinal: Soft and nontender. No distention.   Genitourinary: deferred Musculoskeletal:   Warm and well perfused Neurologic:  Normal speech and language. No gross focal neurologic deficits are appreciated.  Skin:  Skin is warm, dry and intact. No rash noted.  ____________________________________________   LABS (all labs ordered are listed, but only abnormal results are displayed)  Labs Reviewed  COMPREHENSIVE METABOLIC PANEL - Abnormal; Notable for the following components:      Result Value   Potassium 3.1 (*)    Chloride 99 (*)    Glucose, Bld 121 (*)    Creatinine, Ser 1.01 (*)    Total Protein 8.7 (*)    All other components within normal limits  CBC - Abnormal; Notable for the following components:   WBC 13.5 (*)    Hemoglobin 11.7 (*)    MCV 72.3 (*)    MCH 22.8 (*)    MCHC 31.5 (*)    RDW 19.4 (*)    Platelets 536 (*)    All other components within normal limits  URINALYSIS, COMPLETE (UACMP) WITH MICROSCOPIC - Abnormal; Notable for the following components:   Color, Urine YELLOW (*)    APPearance CLEAR (*)    Ketones, ur 5 (*)    Squamous Epithelial / LPF 0-5 (*)    All other components within normal limits  LIPASE, BLOOD  POC URINE PREG, ED  POCT PREGNANCY, URINE    ____________________________________________  EKG   ____________________________________________  RADIOLOGY  None ____________________________________________   PROCEDURES  Procedure(s) performed: No  Procedures   Critical Care performed: No ____________________________________________   INITIAL IMPRESSION / ASSESSMENT AND PLAN / ED COURSE  Pertinent labs & imaging results that were available during my care of the patient were reviewed by me and considered in my medical decision making (see chart for details).  Patient presents with primary complaint of constipation, exam is overall reassuring, no abdominal tenderness palpation.  KUB demonstrates moderate amount of stool in the colon.  Given magnesium citrate here, will discharge with MiraLAX outpatient follow-up    ____________________________________________   FINAL CLINICAL IMPRESSION(S) / ED DIAGNOSES  Final diagnoses:  Slow transit constipation        Note:  This document was prepared using Dragon voice recognition software and may include unintentional dictation errors.    Lavonia Drafts, MD 07/01/17 [phone]

## 2017-07-03 DIAGNOSIS — M6281 Muscle weakness (generalized): Secondary | ICD-10-CM | POA: Diagnosis not present

## 2017-07-03 DIAGNOSIS — M25512 Pain in left shoulder: Secondary | ICD-10-CM | POA: Diagnosis not present

## 2017-07-03 DIAGNOSIS — M25612 Stiffness of left shoulder, not elsewhere classified: Secondary | ICD-10-CM | POA: Diagnosis not present

## 2017-07-03 DIAGNOSIS — R1084 Generalized abdominal pain: Secondary | ICD-10-CM | POA: Diagnosis not present

## 2017-07-04 ENCOUNTER — Emergency Department
Admission: EM | Admit: 2017-07-04 | Discharge: 2017-07-04 | Disposition: A | Discharge status: Home / Self Care | Payer: Medicare Other | Attending: Emergency Medicine | Admitting: Emergency Medicine

## 2017-07-04 ENCOUNTER — Emergency Department: Payer: Medicare Other

## 2017-07-04 ENCOUNTER — Encounter: Payer: Self-pay | Admitting: Emergency Medicine

## 2017-07-04 ENCOUNTER — Other Ambulatory Visit: Payer: Self-pay

## 2017-07-04 DIAGNOSIS — Z79899 Other long term (current) drug therapy: Secondary | ICD-10-CM | POA: Diagnosis not present

## 2017-07-04 DIAGNOSIS — R112 Nausea with vomiting, unspecified: Secondary | ICD-10-CM | POA: Diagnosis not present

## 2017-07-04 DIAGNOSIS — R509 Fever, unspecified: Secondary | ICD-10-CM | POA: Diagnosis not present

## 2017-07-04 DIAGNOSIS — I10 Essential (primary) hypertension: Secondary | ICD-10-CM | POA: Insufficient documentation

## 2017-07-04 DIAGNOSIS — Z85828 Personal history of other malignant neoplasm of skin: Secondary | ICD-10-CM | POA: Insufficient documentation

## 2017-07-04 DIAGNOSIS — R1084 Generalized abdominal pain: Secondary | ICD-10-CM | POA: Diagnosis not present

## 2017-07-04 LAB — COMPREHENSIVE METABOLIC PANEL
ALK PHOS: 83 U/L (ref 38–126)
ALT: 15 U/L (ref 14–54)
AST: 18 U/L (ref 15–41)
Albumin: 4 g/dL (ref 3.5–5.0)
Anion gap: 13 (ref 5–15)
BUN: 7 mg/dL (ref 6–20)
CALCIUM: 8.8 mg/dL — AB (ref 8.9–10.3)
CO2: 24 mmol/L (ref 22–32)
CREATININE: 0.88 mg/dL (ref 0.44–1.00)
Chloride: 103 mmol/L (ref 101–111)
GFR calc non Af Amer: >60 mL/min (ref >60)
Glucose, Bld: 109 mg/dL — ABNORMAL HIGH (ref 65–99)
Potassium: 2.7 mmol/L — CL (ref 3.5–5.1)
SODIUM: 140 mmol/L (ref 135–145)
Total Bilirubin: 0.6 mg/dL (ref 0.3–1.2)
Total Protein: 7.9 g/dL (ref 6.5–8.1)

## 2017-07-04 LAB — URINALYSIS, COMPLETE (UACMP) WITH MICROSCOPIC
Bacteria, UA: NONE SEEN
Bilirubin Urine: NEGATIVE
Glucose, UA: NEGATIVE mg/dL
Hgb urine dipstick: NEGATIVE
Ketones, ur: 5 mg/dL — AB
Leukocytes, UA: NEGATIVE
Nitrite: NEGATIVE
PH: 6 (ref 5.0–8.0)
Protein, ur: NEGATIVE mg/dL
SPECIFIC GRAVITY, URINE: 1.017 (ref 1.005–1.030)

## 2017-07-04 LAB — CBC
HCT: 34.4 % — ABNORMAL LOW (ref 35.0–47.0)
Hemoglobin: 10.9 g/dL — ABNORMAL LOW (ref 12.0–16.0)
MCH: 22.9 pg — AB (ref 26.0–34.0)
MCHC: 31.6 g/dL — ABNORMAL LOW (ref 32.0–36.0)
MCV: 72.5 fL — ABNORMAL LOW (ref 80.0–100.0)
PLATELETS: 461 10*3/uL — AB (ref 150–440)
RBC: 4.74 MIL/uL (ref 3.80–5.20)
RDW: 19.5 % — AB (ref 11.5–14.5)
WBC: 10.9 10*3/uL (ref 3.6–11.0)

## 2017-07-04 LAB — POCT PREGNANCY, URINE: Preg Test, Ur: NEGATIVE

## 2017-07-04 LAB — LIPASE, BLOOD: Lipase: 24 U/L (ref 11–51)

## 2017-07-04 MED ORDER — IOPAMIDOL (ISOVUE-300) INJECTION 61%
100.0000 mL | Freq: Once | INTRAVENOUS | Status: AC | PRN
  Administered 2017-07-04: 100 mL via INTRAVENOUS

## 2017-07-04 MED ORDER — POTASSIUM CHLORIDE 20 MEQ PO PACK
40.0000 meq | PACK | Freq: Once | ORAL | Status: AC
  Administered 2017-07-04: 40 meq via ORAL
  Filled 2017-07-04: qty 2

## 2017-07-04 MED ORDER — MORPHINE SULFATE (PF) 4 MG/ML IV SOLN
4.0000 mg | Freq: Once | INTRAVENOUS | Status: AC
  Administered 2017-07-04: 4 mg via INTRAVENOUS
  Filled 2017-07-04: qty 1

## 2017-07-04 MED ORDER — ONDANSETRON HCL 4 MG/2ML IJ SOLN
4.0000 mg | Freq: Once | INTRAMUSCULAR | Status: AC
  Administered 2017-07-04: 4 mg via INTRAVENOUS
  Filled 2017-07-04: qty 2

## 2017-07-04 MED ORDER — SODIUM CHLORIDE 0.9 % IV BOLUS (SEPSIS)
1000.0000 mL | Freq: Once | INTRAVENOUS | Status: AC
  Administered 2017-07-04: 1000 mL via INTRAVENOUS

## 2017-07-04 MED ORDER — KETOROLAC TROMETHAMINE 30 MG/ML IJ SOLN
30.0000 mg | Freq: Once | INTRAMUSCULAR | Status: AC
  Administered 2017-07-04: 30 mg via INTRAVENOUS
  Filled 2017-07-04: qty 1

## 2017-07-04 MED ORDER — DICYCLOMINE HCL 20 MG PO TABS: 20.0000 mg | ORAL_TABLET | Freq: Three times a day (TID) | ORAL | 0 refills | Status: DC | PRN

## 2017-07-04 MED ORDER — ONDANSETRON 4 MG PO TBDP: 4.0000 mg | ORAL_TABLET | Freq: Three times a day (TID) | ORAL | 0 refills | Status: DC | PRN

## 2017-07-04 NOTE — ED Triage Notes (Signed)
Patient ambulatory to triage with steady gait, without difficulty or distress noted; pt reports seen here Monday for generalized abd pain; dx with constipation and rx med with relief; c/o persistent abd pain accomp by N/V and fever

## 2017-07-04 NOTE — ED Provider Notes (Signed)
[facility] Emergency Department Provider Note   First MD Initiated Contact with Patient 07/04/17 0245     (approximate)  I have reviewed the triage vital signs and the nursing notes.   HISTORY  Chief Complaint Abdominal Pain    HPI [name] is a 47 y.o. female below list of chronic medical conditions including chronic abdominal pain presents to the emergency department with generalized abdominal pain which she states began on Monday and has progressively worsened since onset.  Patient was seen in the emergency department on Monday secondary to abdominal discomfort and diagnosed with constipation at that time.  Patient states that she was given medication for constipation which was successful however her belly pain is continued.  Patient admits to nausea and vomiting as well as subjective fevers.  Patient afebrile on presentation with temperature 98.1.  Patient states her current pain score is 8 out of 10   Past Medical History:  Diagnosis Date  . Anxiety   . Arthritis    "joints ache all over" (10/15/2014)  . Bulging lumbar disc   . Chronic lower back pain   . DDD (degenerative disc disease), cervical   . Depression   . Drug-seeking behavior   . Headache    "weekly" (10/15/2014)  . Hyperlipemia   . Hypertension   . PTSD (post-traumatic stress disorder)   . Skin cancer    "had them cut off my arms; don't know what kind"    Patient Active Problem List   Diagnosis Date Noted  . Acute gastroenteritis 11/19/2015  . Hypomagnesemia   . AKI (acute kidney injury) ([place])   . C. difficile colitis   . SIRS (systemic inflammatory response syndrome) ([place]) 01/11/2015  . Cellulitis 01/03/2015  . Tachycardia   . Dyspnea   . Opiate withdrawal ([place]) 12/18/2014  . Depression 12/17/2014  . Hypotension 12/16/2014  . Herpes simplex virus type 1 (HSV-1) dermatitis   . Sacral fracture ([place]) 12/12/2014  . Fall 12/12/2014  . Nausea and vomiting 12/12/2014  .  Leukocytosis 12/12/2014  . Chronic lower back pain 12/12/2014  . Sacral fracture, closed ([place]) 12/12/2014  . Shingles outbreak 12/12/2014  . Nausea with vomiting   . Intractable abdominal pain 10/15/2014  . Abdominal pain 10/15/2014  . Hypokalemia 09/04/2014  . Sepsis ([place]) 09/01/2014  . Abdominal pain, generalized 09/01/2014  . PTSD (post-traumatic stress disorder) 09/01/2014  . Endometriosis 09/01/2014  . Nausea vomiting and diarrhea 09/01/2014  . Sinus tachycardia 09/01/2014  . Lactic acidosis 09/01/2014  . Severe sepsis ([place]) 09/01/2014    Past Surgical History:  Procedure Laterality Date  . ABLATION ON ENDOMETRIOSIS  2008  . HEMORRHOID SURGERY  ~ 2002    Prior to Admission medications   Medication Sig Start Date End Date Taking? Authorizing Provider  amLODipine (NORVASC) 10 MG tablet Take 1 tablet (10 mg total) by mouth daily. Patient not taking: Reported on 07/01/2017 10/17/14   Kelvin Cellar, MD  diclofenac (VOLTAREN) 50 MG EC tablet Take 1 tablet (50 mg total) by mouth 2 (two) times daily. Patient not taking: Reported on 06/28/2017 12/18/16   Menshew, Dannielle Karvonen, PA-C  dicyclomine (BENTYL) 10 MG capsule Take 2 capsules (20 mg total) by mouth 3 (three) times daily before meals. Patient not taking: Reported on 06/28/2017 10/10/15   Hower, Aaron Mose, MD  famotidine (PEPCID) 20 MG tablet Take 1 tablet (20 mg total) by mouth 2 (two) times daily. 10/17/15   Carrie Mew, MD  FLUoxetine [facility])  40 MG capsule Take 1 capsule by mouth daily. 06/26/17   [provider]  hydrochlorothiazide (HYDRODIURIL) 25 MG tablet Take 1 tablet by mouth daily. 06/26/17   [provider]  HYDROcodone-acetaminophen (NORCO) 10-325 MG per tablet Take 1 tablet by mouth every 8 (eight) hours as needed for moderate pain. Patient not taking: Reported on 06/28/2017 01/17/15   Barton Dubois, MD  lamoTRIgine (LAMICTAL) 100 MG tablet Take 1 tablet by mouth 2 (two) times daily. 06/13/17    [provider]  loperamide (IMODIUM A-D) 2 MG tablet Take 2 tablets (4 mg total) by mouth 4 (four) times daily as needed for diarrhea or loose stools. 10/17/15   Carrie Mew, MD  omeprazole (PRILOSEC) 20 MG capsule Take 1 capsule (20 mg total) by mouth daily. 03/13/15   Marella Chimes, PA-C  ondansetron (ZOFRAN ODT) 4 MG disintegrating tablet Take 1 tablet (4 mg total) by mouth every 8 (eight) hours as needed for nausea or vomiting. 11/28/15   Harvest Dark, MD  polyethylene glycol [facility]) packet Take 17 g by mouth daily. 07/01/17   Lavonia Drafts, MD  promethazine (PHENERGAN) 25 MG tablet Take 1 tablet (25 mg total) by mouth every 8 (eight) hours as needed for nausea or vomiting. 12/18/16   Menshew, Dannielle Karvonen, PA-C  saccharomyces boulardii (FLORASTOR) 250 MG capsule Take 1 capsule (250 mg total) by mouth 2 (two) times daily. Patient not taking: Reported on 06/28/2017 01/17/15   Barton Dubois, MD  traMADol (ULTRAM) 50 MG tablet Take 1 tablet (50 mg total) by mouth 3 (three) times daily as needed. Patient not taking: Reported on 06/28/2017 12/18/16   Menshew, Dannielle Karvonen, PA-C  traZODone (DESYREL) 100 MG tablet Take 1 tablet by mouth daily. 06/03/17   [provider]    Allergies Lactose intolerance (gi)  No family history on file.  Social History Social History   Tobacco Use  . Smoking status: Never Smoker  . Smokeless tobacco: Never Used  Substance Use Topics  . Alcohol use: Yes    Comment: 10/15/2014 "I've drank before; nothing regular; don't drink now cause of RX I'm on"  . Drug use: No    Review of Systems Constitutional: No fever/chills Eyes: No visual changes. ENT: No sore throat. Cardiovascular: Denies chest pain. Respiratory: Denies shortness of breath. Gastrointestinal: Positive for abdominal pain nausea and vomiting Genitourinary: Negative for dysuria. Musculoskeletal: Negative for neck pain.  Negative for back pain. Integumentary:  Negative for rash. Neurological: Negative for headaches, focal weakness or numbness.   ____________________________________________   PHYSICAL EXAM:  VITAL SIGNS: ED Triage Vitals  Enc Vitals Group     BP 07/04/17 0054 (!) 170/110     Pulse Rate 07/04/17 0054 (!) 105     Resp 07/04/17 0054 16     Temp 07/04/17 0054 98.1 F (36.7 C)     Temp Source 07/04/17 0054 Oral     SpO2 07/04/17 0054 96 %     Weight 07/04/17 0051 88.9 kg (196 lb)     Height 07/04/17 0051 1.626 m (5\' 4" )     Head Circumference --      Peak Flow --      Pain Score 07/04/17 0051 10     Pain Loc --      Pain Edu? --      Excl. in [place]? --     Constitutional: Alert and oriented. Well appearing and in no acute distress. Eyes: Conjunctivae are normal.  Head: Atraumatic. Mouth/Throat:  Mucous membranes are moist.  Oropharynx non-erythematous. Neck: No stridor.   Cardiovascular: Normal rate, regular rhythm. Good peripheral circulation. Grossly normal heart sounds. Respiratory: Normal respiratory effort.  No retractions. Lungs CTAB. Gastrointestinal: Generalized tenderness to palpation. No distention.  Musculoskeletal: No lower extremity tenderness nor edema. No gross deformities of extremities. Neurologic:  Normal speech and language. No gross focal neurologic deficits are appreciated.  Skin:  Skin is warm, dry and intact. No rash noted. Psychiatric: Mood and affect are normal. Speech and behavior are normal.  ____________________________________________   LABS (all labs ordered are listed, but only abnormal results are displayed)  Labs Reviewed  COMPREHENSIVE METABOLIC PANEL - Abnormal; Notable for the following components:      Result Value   Potassium 2.7 (*)    Glucose, Bld 109 (*)    Calcium 8.8 (*)    All other components within normal limits  CBC - Abnormal; Notable for the following components:   Hemoglobin 10.9 (*)    HCT 34.4 (*)    MCV 72.5 (*)    MCH 22.9 (*)    MCHC 31.6 (*)    RDW  19.5 (*)    Platelets 461 (*)    All other components within normal limits  URINALYSIS, COMPLETE (UACMP) WITH MICROSCOPIC - Abnormal; Notable for the following components:   Color, Urine YELLOW (*)    APPearance HAZY (*)    Ketones, ur 5 (*)    Squamous Epithelial / LPF 0-5 (*)    All other components within normal limits  LIPASE, BLOOD  POC URINE PREG, ED  POCT PREGNANCY, URINE    RADIOLOGY I, [place] Kayde Warehime, personally viewed and evaluated these images (plain radiographs) as part of my medical decision making, as well as reviewing the written report by the radiologist.   Official radiology report(s): Ct Abdomen Pelvis W Contrast  Result Date: 07/04/2017 CLINICAL DATA:  Acute onset of generalized abdominal pain, nausea and vomiting. Fever. EXAM: CT ABDOMEN AND PELVIS WITH CONTRAST TECHNIQUE: Multidetector CT imaging of the abdomen and pelvis was performed using the standard protocol following bolus administration of intravenous contrast. CONTRAST:  182mL ISOVUE-300 IOPAMIDOL (ISOVUE-300) INJECTION 61% COMPARISON:  CT of the abdomen and pelvis from 06/27/2017 FINDINGS: Lower chest: Minimal right basilar atelectasis is noted. The visualized portions of the mediastinum are unremarkable. Hepatobiliary: The liver is unremarkable in appearance. The gallbladder is unremarkable in appearance. The common bile duct remains normal in caliber. Pancreas: The pancreas is within normal limits. Spleen: The spleen is unremarkable in appearance. Adrenals/Urinary Tract: The adrenal glands are unremarkable in appearance. The kidneys are within normal limits. There is no evidence of hydronephrosis. No renal or ureteral stones are identified. Minimal perinephric stranding is noted bilaterally. Stomach/Bowel: The stomach is unremarkable in appearance. The small bowel is within normal limits. The appendix is normal in caliber, without evidence of appendicitis. The colon is unremarkable in appearance.  Vascular/Lymphatic: The abdominal aorta is unremarkable in appearance. The inferior vena cava is grossly unremarkable. No retroperitoneal lymphadenopathy is seen. No pelvic sidewall lymphadenopathy is identified. Reproductive: The bladder is mildly distended and grossly unremarkable in appearance. The uterus is grossly unremarkable. The ovaries are relatively symmetric. No suspicious adnexal masses are seen. Other: No additional soft tissue abnormalities are seen. Musculoskeletal: No acute osseous abnormalities are identified. The visualized musculature is unremarkable in appearance. IMPRESSION: Unremarkable contrast-enhanced CT of the abdomen and pelvis. Electronically Signed   By: Garald Balding M.D.   On: 07/04/2017 04:14  Procedures   ____________________________________________   INITIAL IMPRESSION / ASSESSMENT AND PLAN / ED COURSE  As part of my medical decision making, I reviewed the following data within the electronic MEDICAL RECORD NUMBER  47 year old female present with above-stated history of generalized abdominal pain accompanied by nausea and vomiting.  Laboratory data remarkable for hypokalemia with potassium of 2.7 for which patient was orally repleted with potassium.  CT scan of the abdomen and pelvis was reported as unremarkable contrast enhanced CT scan of the abdomen and pelvis.  Patient was given IV morphine in the emergency department improvement of pain as well as Zofran with no vomiting while in the emergency department.  Patient has a appointment scheduled with gastroenterology which I encouraged her to follow-up with GI    ____________________________________________  FINAL CLINICAL IMPRESSION(S) / ED DIAGNOSES  Final diagnoses:  Generalized abdominal pain     MEDICATIONS GIVEN DURING THIS VISIT:  Medications  potassium chloride (KLOR-CON) packet 40 mEq (not administered)  morphine 4 MG/ML injection 4 mg (4 mg Intravenous Given 07/04/17 0324)  ondansetron (ZOFRAN)  injection 4 mg (4 mg Intravenous Given 07/04/17 0324)  sodium chloride 0.9 % bolus 1,000 mL (1,000 mLs Intravenous New Bag/Given 07/04/17 0324)  iopamidol (ISOVUE-300) 61 % injection 100 mL (100 mLs Intravenous Contrast Given 07/04/17 0348)     ED Discharge Orders    None       Note:  This document was prepared using Dragon voice recognition software and may include unintentional dictation errors.    Gregor Hams, MD 07/04/17 [phone]

## 2017-07-10 DIAGNOSIS — M25612 Stiffness of left shoulder, not elsewhere classified: Secondary | ICD-10-CM | POA: Diagnosis not present

## 2017-07-10 DIAGNOSIS — M6281 Muscle weakness (generalized): Secondary | ICD-10-CM | POA: Diagnosis not present

## 2017-07-10 DIAGNOSIS — M25512 Pain in left shoulder: Secondary | ICD-10-CM | POA: Diagnosis not present

## 2017-07-12 DIAGNOSIS — M25512 Pain in left shoulder: Secondary | ICD-10-CM | POA: Diagnosis not present

## 2017-07-12 DIAGNOSIS — M25612 Stiffness of left shoulder, not elsewhere classified: Secondary | ICD-10-CM | POA: Diagnosis not present

## 2017-07-12 DIAGNOSIS — M6281 Muscle weakness (generalized): Secondary | ICD-10-CM | POA: Diagnosis not present

## 2017-07-17 DIAGNOSIS — R112 Nausea with vomiting, unspecified: Secondary | ICD-10-CM | POA: Diagnosis not present

## 2017-07-26 DIAGNOSIS — R110 Nausea: Secondary | ICD-10-CM | POA: Diagnosis not present

## 2017-07-26 DIAGNOSIS — K222 Esophageal obstruction: Secondary | ICD-10-CM | POA: Diagnosis not present

## 2017-07-26 DIAGNOSIS — Z79899 Other long term (current) drug therapy: Secondary | ICD-10-CM | POA: Diagnosis not present

## 2017-07-26 DIAGNOSIS — K210 Gastro-esophageal reflux disease with esophagitis: Secondary | ICD-10-CM | POA: Diagnosis not present

## 2017-07-26 DIAGNOSIS — K2270 Barrett's esophagus without dysplasia: Secondary | ICD-10-CM | POA: Diagnosis not present

## 2017-07-26 DIAGNOSIS — R1310 Dysphagia, unspecified: Secondary | ICD-10-CM | POA: Diagnosis not present

## 2017-07-26 DIAGNOSIS — F329 Major depressive disorder, single episode, unspecified: Secondary | ICD-10-CM | POA: Diagnosis not present

## 2017-07-26 DIAGNOSIS — K449 Diaphragmatic hernia without obstruction or gangrene: Secondary | ICD-10-CM | POA: Diagnosis not present

## 2017-07-26 DIAGNOSIS — K219 Gastro-esophageal reflux disease without esophagitis: Secondary | ICD-10-CM | POA: Diagnosis not present

## 2017-07-26 DIAGNOSIS — I10 Essential (primary) hypertension: Secondary | ICD-10-CM | POA: Diagnosis not present

## 2017-08-28 DIAGNOSIS — K921 Melena: Secondary | ICD-10-CM | POA: Diagnosis not present

## 2017-09-11 DIAGNOSIS — M25531 Pain in right wrist: Secondary | ICD-10-CM | POA: Diagnosis not present

## 2017-09-18 DIAGNOSIS — S63501A Unspecified sprain of right wrist, initial encounter: Secondary | ICD-10-CM | POA: Diagnosis not present

## 2017-10-15 ENCOUNTER — Encounter: Payer: Self-pay | Admitting: Podiatry

## 2017-10-15 ENCOUNTER — Ambulatory Visit (INDEPENDENT_AMBULATORY_CARE_PROVIDER_SITE_OTHER): Payer: Medicare Other | Admitting: Podiatry

## 2017-10-15 ENCOUNTER — Ambulatory Visit (INDEPENDENT_AMBULATORY_CARE_PROVIDER_SITE_OTHER): Payer: Medicare Other

## 2017-10-15 VITALS — BP 132/80 | HR 82 | Temp 98.0°F | Resp 15 | Ht 64.0 in | Wt 180.0 lb

## 2017-10-15 DIAGNOSIS — R269 Unspecified abnormalities of gait and mobility: Secondary | ICD-10-CM | POA: Diagnosis not present

## 2017-10-15 DIAGNOSIS — S93412S Sprain of calcaneofibular ligament of left ankle, sequela: Secondary | ICD-10-CM

## 2017-10-15 DIAGNOSIS — M659 Synovitis and tenosynovitis, unspecified: Secondary | ICD-10-CM

## 2017-10-15 DIAGNOSIS — M25572 Pain in left ankle and joints of left foot: Secondary | ICD-10-CM | POA: Diagnosis not present

## 2017-10-15 DIAGNOSIS — S93409S Sprain of unspecified ligament of unspecified ankle, sequela: Secondary | ICD-10-CM | POA: Diagnosis not present

## 2017-10-15 DIAGNOSIS — G8929 Other chronic pain: Secondary | ICD-10-CM | POA: Diagnosis not present

## 2017-10-15 NOTE — Progress Notes (Signed)
   Subjective:    Patient ID: [name], female    DOB: 1971-04-15, 47 y.o.   MRN: [id number]  HPI    Review of Systems  Musculoskeletal: Positive for arthralgias and myalgias.  All other systems reviewed and are negative.      Objective:   Physical Exam        Assessment & Plan:

## 2017-10-15 NOTE — Progress Notes (Signed)
Subjective:  Patient ID: [name], female    DOB: [birth year],  MRN: [id number]  Chief Complaint  Patient presents with  . Foot Injury    L lateral foot and lateral ankle x 1 yr; 3/10 sharp pain (intermittent) Tx: none Pt. stated,' fell and went straight to the groung on my foot. I went to the Dr. and they only put me on a boot and it feels like it's not healed."   47 y.o. female presents with the above complaint.  Reports injury to the outside of the left ankle x1 year ago states that she fractured something but not sure what it is still having pain because of the ankle.  Reports 3-10 sharp pain that is intermittent in nature states that she fell and went straight to run her foot at the time of injury.  Denies prior treatments.  States that her foot feels is not healed.  Endorses instability of the ankle  Past Medical History:  Diagnosis Date  . Anxiety   . Arthritis    "joints ache all over" (10/15/2014)  . Bulging lumbar disc   . Chronic lower back pain   . DDD (degenerative disc disease), cervical   . Depression   . Drug-seeking behavior   . Headache    "weekly" (10/15/2014)  . Hyperlipemia   . Hypertension   . PTSD (post-traumatic stress disorder)   . Skin cancer    "had them cut off my arms; don't know what kind"   Past Surgical History:  Procedure Laterality Date  . ABLATION ON ENDOMETRIOSIS  2008  . HEMORRHOID SURGERY  ~ 2002    Current Outpatient Medications:  .  amLODipine (NORVASC) 10 MG tablet, Take 1 tablet (10 mg total) by mouth daily., Disp: 30 tablet, Rfl: 2 .  diclofenac (VOLTAREN) 50 MG EC tablet, Take 1 tablet (50 mg total) by mouth 2 (two) times daily., Disp: 20 tablet, Rfl: 0 .  dicyclomine (BENTYL) 10 MG capsule, Take 2 capsules (20 mg total) by mouth 3 (three) times daily before meals., Disp: 90 capsule, Rfl: 0 .  dicyclomine (BENTYL) 20 MG tablet, Take 1 tablet (20 mg total) by mouth 3 (three) times daily as needed for spasms., Disp: 30 tablet, Rfl:  0 .  famotidine (PEPCID) 20 MG tablet, Take 1 tablet (20 mg total) by mouth 2 (two) times daily., Disp: 60 tablet, Rfl: 0 .  FLUoxetine (PROZAC) 40 MG capsule, Take 1 capsule by mouth daily., Disp: , Rfl:  .  hydrochlorothiazide (HYDRODIURIL) 25 MG tablet, Take 1 tablet by mouth daily., Disp: , Rfl:  .  HYDROcodone-acetaminophen (NORCO) 10-325 MG per tablet, Take 1 tablet by mouth every 8 (eight) hours as needed for moderate pain., Disp: , Rfl:  .  lamoTRIgine (LAMICTAL) 100 MG tablet, Take 1 tablet by mouth 2 (two) times daily., Disp: , Rfl:  .  loperamide (IMODIUM A-D) 2 MG tablet, Take 2 tablets (4 mg total) by mouth 4 (four) times daily as needed for diarrhea or loose stools., Disp: 30 tablet, Rfl: 0 .  omeprazole (PRILOSEC) 20 MG capsule, Take 1 capsule (20 mg total) by mouth daily., Disp: 30 capsule, Rfl: 0 .  ondansetron (ZOFRAN ODT) 4 MG disintegrating tablet, Take 1 tablet (4 mg total) by mouth every 8 (eight) hours as needed for nausea or vomiting., Disp: 20 tablet, Rfl: 0 .  ondansetron (ZOFRAN ODT) 4 MG disintegrating tablet, Take 1 tablet (4 mg total) by mouth every 8 (eight) hours as needed for nausea  or vomiting., Disp: 20 tablet, Rfl: 0 .  polyethylene glycol (MIRALAX) packet, Take 17 g by mouth daily., Disp: 14 each, Rfl: 0 .  promethazine (PHENERGAN) 25 MG tablet, Take 1 tablet (25 mg total) by mouth every 8 (eight) hours as needed for nausea or vomiting., Disp: 12 tablet, Rfl: 0 .  saccharomyces boulardii (FLORASTOR) 250 MG capsule, Take 1 capsule (250 mg total) by mouth 2 (two) times daily., Disp: 60 capsule, Rfl: 0 .  traMADol (ULTRAM) 50 MG tablet, Take 1 tablet (50 mg total) by mouth 3 (three) times daily as needed., Disp: 21 tablet, Rfl: 0 .  traZODone (DESYREL) 100 MG tablet, Take 1 tablet by mouth daily., Disp: , Rfl:   Allergies  Allergen Reactions  . Lactose Intolerance (Gi) Nausea Only   Review of Systems: Negative except as noted in the HPI. Denies  N/V/F/Ch. Objective:   Vitals:   10/15/17 1351  BP: 132/80  Pulse: 82  Resp: 15  Temp: 98 F (36.7 C)   General AA&O x3. Normal mood and affect.  Vascular Dorsalis pedis and posterior tibial pulses  present 2+ bilaterally  Capillary refill normal to all digits. Pedal hair growth normal.  Neurologic Epicritic sensation grossly present.  Dermatologic No open lesions. Interspaces clear of maceration. Nails well groomed and normal in appearance.  Orthopedic: MMT 5/5 in dorsiflexion, plantarflexion, inversion, and eversion. Normal joint ROM without pain or crepitus. Pain palpation about the left ATFL   Assessment & Plan:  Patient was evaluated and treated and all questions answered.  Ankle instability left -X-rays taken reviewed no evidence of prior or active fracture dislocation -Dispense ASO Tri-Lock brace -Would consider PT in the future -We will follow-up in 6 weeks  Return in about 6 weeks (around 11/26/2017) for ankle sprain f/u.

## 2017-10-16 ENCOUNTER — Other Ambulatory Visit: Payer: Self-pay | Admitting: Podiatry

## 2017-10-16 DIAGNOSIS — G8929 Other chronic pain: Secondary | ICD-10-CM

## 2017-10-16 DIAGNOSIS — M25572 Pain in left ankle and joints of left foot: Secondary | ICD-10-CM

## 2017-10-16 DIAGNOSIS — S93409S Sprain of unspecified ligament of unspecified ankle, sequela: Secondary | ICD-10-CM

## 2017-10-16 DIAGNOSIS — M659 Synovitis and tenosynovitis, unspecified: Secondary | ICD-10-CM

## 2017-10-16 DIAGNOSIS — R269 Unspecified abnormalities of gait and mobility: Secondary | ICD-10-CM

## 2017-11-03 ENCOUNTER — Encounter: Payer: Self-pay | Admitting: *Deleted

## 2017-11-03 ENCOUNTER — Other Ambulatory Visit: Payer: Self-pay

## 2017-11-03 ENCOUNTER — Emergency Department
Admission: EM | Admit: 2017-11-03 | Discharge: 2017-11-03 | Disposition: A | Discharge status: Home / Self Care | Payer: Medicare Other | Attending: Emergency Medicine | Admitting: Emergency Medicine

## 2017-11-03 DIAGNOSIS — R197 Diarrhea, unspecified: Secondary | ICD-10-CM | POA: Diagnosis not present

## 2017-11-03 DIAGNOSIS — R140 Abdominal distension (gaseous): Secondary | ICD-10-CM | POA: Diagnosis present

## 2017-11-03 DIAGNOSIS — N390 Urinary tract infection, site not specified: Secondary | ICD-10-CM | POA: Insufficient documentation

## 2017-11-03 DIAGNOSIS — Z85828 Personal history of other malignant neoplasm of skin: Secondary | ICD-10-CM | POA: Diagnosis not present

## 2017-11-03 DIAGNOSIS — Z79899 Other long term (current) drug therapy: Secondary | ICD-10-CM | POA: Insufficient documentation

## 2017-11-03 DIAGNOSIS — B820 Intestinal helminthiasis, unspecified: Secondary | ICD-10-CM | POA: Diagnosis not present

## 2017-11-03 DIAGNOSIS — R109 Unspecified abdominal pain: Secondary | ICD-10-CM | POA: Diagnosis not present

## 2017-11-03 DIAGNOSIS — I10 Essential (primary) hypertension: Secondary | ICD-10-CM | POA: Diagnosis not present

## 2017-11-03 DIAGNOSIS — B829 Intestinal parasitism, unspecified: Secondary | ICD-10-CM | POA: Diagnosis not present

## 2017-11-03 LAB — CBC
HCT: 31.9 % — ABNORMAL LOW (ref 35.0–47.0)
Hemoglobin: 10.1 g/dL — ABNORMAL LOW (ref 12.0–16.0)
MCH: 22.8 pg — AB (ref 26.0–34.0)
MCHC: 31.8 g/dL — ABNORMAL LOW (ref 32.0–36.0)
MCV: 71.7 fL — ABNORMAL LOW (ref 80.0–100.0)
PLATELETS: 441 10*3/uL — AB (ref 150–440)
RBC: 4.44 MIL/uL (ref 3.80–5.20)
RDW: 18.8 % — ABNORMAL HIGH (ref 11.5–14.5)
WBC: 9 10*3/uL (ref 3.6–11.0)

## 2017-11-03 LAB — COMPREHENSIVE METABOLIC PANEL
ALT: 15 U/L (ref 0–44)
AST: 24 U/L (ref 15–41)
Albumin: 3.8 g/dL (ref 3.5–5.0)
Alkaline Phosphatase: 97 U/L (ref 38–126)
Anion gap: 9 (ref 5–15)
BILIRUBIN TOTAL: 0.5 mg/dL (ref 0.3–1.2)
BUN: 9 mg/dL (ref 6–20)
CO2: 26 mmol/L (ref 22–32)
CREATININE: 1.16 mg/dL — AB (ref 0.44–1.00)
Calcium: 8.8 mg/dL — ABNORMAL LOW (ref 8.9–10.3)
Chloride: 103 mmol/L (ref 98–111)
GFR, EST NON AFRICAN AMERICAN: 56 mL/min — AB (ref >60)
Glucose, Bld: 164 mg/dL — ABNORMAL HIGH (ref 70–99)
Potassium: 3.7 mmol/L (ref 3.5–5.1)
Sodium: 138 mmol/L (ref 135–145)
Total Protein: 7.4 g/dL (ref 6.5–8.1)

## 2017-11-03 LAB — URINALYSIS, COMPLETE (UACMP) WITH MICROSCOPIC
Bilirubin Urine: NEGATIVE
GLUCOSE, UA: NEGATIVE mg/dL
HGB URINE DIPSTICK: NEGATIVE
KETONES UR: NEGATIVE mg/dL
Nitrite: NEGATIVE
PH: 7 (ref 5.0–8.0)
Protein, ur: NEGATIVE mg/dL
SPECIFIC GRAVITY, URINE: 1.015 (ref 1.005–1.030)
WBC, UA: >50 WBC/hpf — ABNORMAL HIGH (ref 0–5)

## 2017-11-03 LAB — POC URINE PREG, ED: Preg Test, Ur: NEGATIVE

## 2017-11-03 LAB — LIPASE, BLOOD: Lipase: 26 U/L (ref 11–51)

## 2017-11-03 MED ORDER — CEPHALEXIN 250 MG PO CAPS: 250.0000 mg | ORAL_CAPSULE | Freq: Two times a day (BID) | ORAL | 0 refills | Status: AC

## 2017-11-03 MED ORDER — ALBENDAZOLE 200 MG PO TABS: 400.0000 mg | ORAL_TABLET | Freq: Once | ORAL | 0 refills | Status: AC

## 2017-11-03 MED ORDER — ALBENDAZOLE 200 MG PO TABS
400.0000 mg | ORAL_TABLET | Freq: Once | ORAL | Status: AC
  Administered 2017-11-03: 400 mg via ORAL
  Filled 2017-11-03: qty 2

## 2017-11-03 NOTE — ED Notes (Addendum)
VS re-assess at this time.  Lab notified of urine culture.

## 2017-11-03 NOTE — Discharge Instructions (Signed)
? ?  Please return to the emergency room right away if you are to develop a fever, severe nausea, your pain becomes severe or worsens, you are unable to keep food down, begin vomiting any dark or bloody fluid, you develop any dark or bloody stools, feel dehydrated, or other new concerns or symptoms arise. ? ?

## 2017-11-03 NOTE — ED Provider Notes (Signed)
[facility] Emergency Department Provider Note ____________________________________________   First MD Initiated Contact with Patient 11/03/17 1615     (approximate)  I have reviewed the triage vital signs and the nursing notes.   HISTORY  Chief Complaint Abdominal Pain and Possible parasites   HPI [name] is a 47 y.o. female straight depression, PTSD  Patient reports that about 2 to 3 weeks ago she traveled to [facility], a couple days after he began developing a occasional crampy abdominal discomfort and some bloating.  She started having slight loosening of her stools, increased burping at times.  Currently not having any pain but she is noticed over the last few days that when she defecates she is noticed small white worms in her stool.  She also burped at one point that she had something weight restricting her mouth, but she is not sure it was a warm.  She reports she is certain that she is seeing worms in her stool that are very small, white.  She also felt after seeing worms like her head felt itchy, she tried treating for lice but has not seen any lice in her hair but thinks she is certain that there is some small worms in her stool.  She also started noticing a slight burning feeling with urination starting about 2 days ago and reports she is had many urinary tract infections and thinks she may be starting to get one as well.  Past Medical History:  Diagnosis Date  . Anxiety   . Arthritis    "joints ache all over" (10/15/2014)  . Bulging lumbar disc   . Chronic lower back pain   . DDD (degenerative disc disease), cervical   . Depression   . Drug-seeking behavior   . Headache    "weekly" (10/15/2014)  . Hyperlipemia   . Hypertension   . PTSD (post-traumatic stress disorder)   . Skin cancer    "had them cut off my arms; don't know what kind"    Patient Active Problem List   Diagnosis Date Noted  . Acute gastroenteritis 11/19/2015    . Hypomagnesemia   . AKI (acute kidney injury) ([place])   . C. difficile colitis   . SIRS (systemic inflammatory response syndrome) ([place]) 01/11/2015  . Cellulitis 01/03/2015  . Tachycardia   . Dyspnea   . Opiate withdrawal ([place]) 12/18/2014  . Depression 12/17/2014  . Hypotension 12/16/2014  . Herpes simplex virus type 1 (HSV-1) dermatitis   . Sacral fracture ([place]) 12/12/2014  . Fall 12/12/2014  . Nausea and vomiting 12/12/2014  . Leukocytosis 12/12/2014  . Chronic lower back pain 12/12/2014  . Sacral fracture, closed ([place]) 12/12/2014  . Shingles outbreak 12/12/2014  . Nausea with vomiting   . Intractable abdominal pain 10/15/2014  . Abdominal pain 10/15/2014  . Hypokalemia 09/04/2014  . Sepsis ([place]) 09/01/2014  . Abdominal pain, generalized 09/01/2014  . PTSD (post-traumatic stress disorder) 09/01/2014  . Endometriosis 09/01/2014  . Nausea vomiting and diarrhea 09/01/2014  . Sinus tachycardia 09/01/2014  . Lactic acidosis 09/01/2014  . Severe sepsis ([place]) 09/01/2014    Past Surgical History:  Procedure Laterality Date  . ABLATION ON ENDOMETRIOSIS  2008  . HEMORRHOID SURGERY  ~ 2002    Prior to Admission medications   Medication Sig Start Date End Date Taking? Authorizing Provider  albendazole (ALBENZA) 200 MG tablet Take 2 tablets (400 mg total) by mouth once for 1 dose. 400mg  by mouth once on 11/17/2017 11/17/17 11/17/17  Delman Kitten, MD  amLODipine (NORVASC) 10 MG tablet Take 1 tablet (10 mg total) by mouth daily. 10/17/14   Kelvin Cellar, MD  cephALEXin (KEFLEX) 250 MG capsule Take 1 capsule (250 mg total) by mouth 2 (two) times daily for 7 days. 11/03/17 11/10/17  Delman Kitten, MD  diclofenac (VOLTAREN) 50 MG EC tablet Take 1 tablet (50 mg total) by mouth 2 (two) times daily. 12/18/16   Menshew, Dannielle Karvonen, PA-C  dicyclomine (BENTYL) 10 MG capsule Take 2 capsules (20 mg total) by mouth 3 (three) times daily before meals. 10/10/15   Hower, Aaron Mose, MD  dicyclomine  (BENTYL) 20 MG tablet Take 1 tablet (20 mg total) by mouth 3 (three) times daily as needed for spasms. 07/04/17 07/04/18  Gregor Hams, MD  famotidine (PEPCID) 20 MG tablet Take 1 tablet (20 mg total) by mouth 2 (two) times daily. 10/17/15   Carrie Mew, MD  FLUoxetine (PROZAC) 40 MG capsule Take 1 capsule by mouth daily. 06/26/17   [provider]  hydrochlorothiazide (HYDRODIURIL) 25 MG tablet Take 1 tablet by mouth daily. 06/26/17   [provider]  HYDROcodone-acetaminophen (NORCO) 10-325 MG per tablet Take 1 tablet by mouth every 8 (eight) hours as needed for moderate pain. 01/17/15   Barton Dubois, MD  lamoTRIgine (LAMICTAL) 100 MG tablet Take 1 tablet by mouth 2 (two) times daily. 06/13/17   [provider]  loperamide (IMODIUM A-D) 2 MG tablet Take 2 tablets (4 mg total) by mouth 4 (four) times daily as needed for diarrhea or loose stools. 10/17/15   Carrie Mew, MD  omeprazole (PRILOSEC) 20 MG capsule Take 1 capsule (20 mg total) by mouth daily. 03/13/15   Marella Chimes, PA-C  ondansetron (ZOFRAN ODT) 4 MG disintegrating tablet Take 1 tablet (4 mg total) by mouth every 8 (eight) hours as needed for nausea or vomiting. 11/28/15   Harvest Dark, MD  ondansetron (ZOFRAN ODT) 4 MG disintegrating tablet Take 1 tablet (4 mg total) by mouth every 8 (eight) hours as needed for nausea or vomiting. 07/04/17   Gregor Hams, MD  polyethylene glycol [facility]) packet Take 17 g by mouth daily. 07/01/17   Lavonia Drafts, MD  promethazine (PHENERGAN) 25 MG tablet Take 1 tablet (25 mg total) by mouth every 8 (eight) hours as needed for nausea or vomiting. 12/18/16   Menshew, Dannielle Karvonen, PA-C  saccharomyces boulardii (FLORASTOR) 250 MG capsule Take 1 capsule (250 mg total) by mouth 2 (two) times daily. 01/17/15   Barton Dubois, MD  traMADol (ULTRAM) 50 MG tablet Take 1 tablet (50 mg total) by mouth 3 (three) times daily as needed. 12/18/16   Menshew, Dannielle Karvonen, PA-C  traZODone (DESYREL) 100 MG tablet Take 1 tablet by mouth daily. 06/03/17   [provider]    Allergies Lactose intolerance (gi)  History reviewed. No pertinent family history.  Social History Social History   Tobacco Use  . Smoking status: Never Smoker  . Smokeless tobacco: Never Used  Substance Use Topics  . Alcohol use: Yes    Comment: 10/15/2014 "I've drank before; nothing regular; don't drink now cause of RX I'm on"  . Drug use: No    Review of Systems Constitutional: No fever/chills Eyes: No visual changes. ENT: No sore throat. Cardiovascular: Denies chest pain. Respiratory: Denies shortness of breath. Gastrointestinal: No abdominal pain but having some occasional cramping increased burping and occasional flatulence.  No nausea, no vomiting.  No constipation. Genitourinary: Negative  for dysuria.  No black or bloody stool. Musculoskeletal: Negative for back pain. Skin: Negative for rash. Neurological: Negative for headaches, focal weakness or numbness.  Denies hallucinations.    ____________________________________________   PHYSICAL EXAM:  VITAL SIGNS: ED Triage Vitals  Enc Vitals Group     BP 11/03/17 1346 95/68     Pulse Rate 11/03/17 1344 63     Resp 11/03/17 1344 16     Temp 11/03/17 1344 98.8 F (37.1 C)     Temp Source 11/03/17 1344 Oral     SpO2 11/03/17 1344 99 %     Weight 11/03/17 1345 180 lb (81.6 kg)     Height 11/03/17 1345 5\' 4"  (1.626 m)     Head Circumference --      Peak Flow --      Pain Score 11/03/17 1345 6     Pain Loc --      Pain Edu? --      Excl. in [place]? --     Constitutional: Alert and oriented. Well appearing and in no acute distress. Eyes: Conjunctivae are normal. Head: Atraumatic. Nose: No congestion/rhinnorhea. Mouth/Throat: Mucous membranes are moist. Neck: No stridor.   Cardiovascular: Normal rate, regular rhythm. Grossly normal heart sounds.  Good peripheral circulation. Respiratory: Normal  respiratory effort.  No retractions. Lungs CTAB. Gastrointestinal: Soft and nontender. No distention.  Currently reports not having pain but reports intermittently feel crampy loose stools. Musculoskeletal: No lower extremity tenderness nor edema. Neurologic:  Normal speech and language. No gross focal neurologic deficits are appreciated.  Skin:  Skin is warm, dry and intact. No rash noted. Psychiatric: Mood and affect are normal. Speech and behavior are normal.  ____________________________________________   LABS (all labs ordered are listed, but only abnormal results are displayed)  Labs Reviewed  COMPREHENSIVE METABOLIC PANEL - Abnormal; Notable for the following components:      Result Value   Glucose, Bld 164 (*)    Creatinine, Ser 1.16 (*)    Calcium 8.8 (*)    GFR calc non Af Amer 56 (*)    All other components within normal limits  CBC - Abnormal; Notable for the following components:   Hemoglobin 10.1 (*)    HCT 31.9 (*)    MCV 71.7 (*)    MCH 22.8 (*)    MCHC 31.8 (*)    RDW 18.8 (*)    Platelets 441 (*)    All other components within normal limits  URINALYSIS, COMPLETE (UACMP) WITH MICROSCOPIC - Abnormal; Notable for the following components:   Color, Urine AMBER (*)    APPearance HAZY (*)    Leukocytes, UA SMALL (*)    WBC, UA >50 (*)    Bacteria, UA RARE (*)    Non Squamous Epithelial 0-5 (*)    All other components within normal limits  GASTROINTESTINAL PANEL BY PCR, STOOL (REPLACES STOOL CULTURE)  OVA + PARASITE EXAM  URINE CULTURE  LIPASE, BLOOD  POC URINE PREG, ED   ____________________________________________  EKG   ____________________________________________  RADIOLOGY  Currently pain-free, normal white count afebrile.  Reassuring clinical exam. ____________________________________________   PROCEDURES  Procedure(s) performed: None  Procedures  Critical Care performed: No  ____________________________________________   INITIAL  IMPRESSION / ASSESSMENT AND PLAN / ED COURSE  Pertinent labs & imaging results that were available during my care of the patient were reviewed by me and considered in my medical decision making (see chart for details).  Crampy abdominal discomfort with some occasional belching  and seeing small worms in her stool.  What she describes sounds like potential parasites in her stool, potential pinworms.  She does describe symptoms that seem consistent with possible parasite.  Very reassuring clinical exam.  [place], possible she could have received  parasites transmitted.  Normal vital signs.  Very stable, reassuring exam.  Will treat with albendazole.  Also reports some dysuria now with white blood cells and rare bacteria urine, will also provide prescription for cephalexin.  Discussed with patient second dose of albendazole in 2 weeks.  Patient agreeable, understanding and will follow-up with primary care doctor.  Return precautions discussed.      ____________________________________________   FINAL CLINICAL IMPRESSION(S) / ED DIAGNOSES  Final diagnoses:  Lower urinary tract infection, acute  Intestinal nematode infection      NEW MEDICATIONS STARTED DURING THIS VISIT:  New Prescriptions   ALBENDAZOLE (ALBENZA) 200 MG TABLET    Take 2 tablets (400 mg total) by mouth once for 1 dose. 400mg  by mouth once on 11/17/2017   CEPHALEXIN (KEFLEX) 250 MG CAPSULE    Take 1 capsule (250 mg total) by mouth 2 (two) times daily for 7 days.     Note:  This document was prepared using Dragon voice recognition software and may include unintentional dictation errors.     Delman Kitten, MD 11/03/17 [phone]

## 2017-11-03 NOTE — ED Triage Notes (Signed)
Pt to ED reporting generalized abd cramping for the past week. PT then reports she has parasites in her stool and in her mouth. She verbalized that she feels them crawling on her head as well but can not see them on her scull. Pt used lice shampoo last night without relief. Pt reports the worms in her mouth are intermittent and she does not have any at this time. No BM in the past 4 days but 4 days ago pt had small white worms in her stool. No fevers. Pt reports having read on WebMD that drinking pineapple juice would help but does not feel like it is helping any longer.

## 2017-11-05 ENCOUNTER — Emergency Department: Payer: Medicare Other

## 2017-11-05 ENCOUNTER — Other Ambulatory Visit: Payer: Self-pay

## 2017-11-05 ENCOUNTER — Emergency Department
Admission: EM | Admit: 2017-11-05 | Discharge: 2017-11-05 | Disposition: A | Discharge status: Home / Self Care | Payer: Medicare Other | Attending: Emergency Medicine | Admitting: Emergency Medicine

## 2017-11-05 DIAGNOSIS — R1010 Upper abdominal pain, unspecified: Secondary | ICD-10-CM | POA: Insufficient documentation

## 2017-11-05 DIAGNOSIS — R339 Retention of urine, unspecified: Secondary | ICD-10-CM | POA: Diagnosis not present

## 2017-11-05 DIAGNOSIS — R0602 Shortness of breath: Secondary | ICD-10-CM | POA: Diagnosis not present

## 2017-11-05 DIAGNOSIS — R531 Weakness: Secondary | ICD-10-CM | POA: Diagnosis not present

## 2017-11-05 DIAGNOSIS — R200 Anesthesia of skin: Secondary | ICD-10-CM | POA: Insufficient documentation

## 2017-11-05 DIAGNOSIS — E876 Hypokalemia: Secondary | ICD-10-CM | POA: Diagnosis not present

## 2017-11-05 DIAGNOSIS — R29810 Facial weakness: Secondary | ICD-10-CM | POA: Diagnosis not present

## 2017-11-05 DIAGNOSIS — Z85828 Personal history of other malignant neoplasm of skin: Secondary | ICD-10-CM | POA: Diagnosis not present

## 2017-11-05 DIAGNOSIS — Z79899 Other long term (current) drug therapy: Secondary | ICD-10-CM | POA: Insufficient documentation

## 2017-11-05 DIAGNOSIS — I10 Essential (primary) hypertension: Secondary | ICD-10-CM | POA: Insufficient documentation

## 2017-11-05 DIAGNOSIS — D649 Anemia, unspecified: Secondary | ICD-10-CM | POA: Diagnosis not present

## 2017-11-05 LAB — URINE DRUG SCREEN, QUALITATIVE (ARMC ONLY)
AMPHETAMINES, UR SCREEN: NOT DETECTED
Benzodiazepine, Ur Scrn: NOT DETECTED
Cannabinoid 50 Ng, Ur ~~LOC~~: NOT DETECTED
Cocaine Metabolite,Ur ~~LOC~~: NOT DETECTED
MDMA (ECSTASY) UR SCREEN: NOT DETECTED
Methadone Scn, Ur: NOT DETECTED
Opiate, Ur Screen: NOT DETECTED
Phencyclidine (PCP) Ur S: NOT DETECTED
TRICYCLIC, UR SCREEN: NOT DETECTED

## 2017-11-05 LAB — ETHANOL

## 2017-11-05 LAB — URINALYSIS, COMPLETE (UACMP) WITH MICROSCOPIC
BACTERIA UA: NONE SEEN
Bilirubin Urine: NEGATIVE
Glucose, UA: NEGATIVE mg/dL
Hgb urine dipstick: NEGATIVE
KETONES UR: NEGATIVE mg/dL
NITRITE: NEGATIVE
PH: 6 (ref 5.0–8.0)
Protein, ur: NEGATIVE mg/dL
Specific Gravity, Urine: 1.005 (ref 1.005–1.030)

## 2017-11-05 LAB — CBC
HEMATOCRIT: 29 % — AB (ref 35.0–47.0)
HEMOGLOBIN: 9.1 g/dL — AB (ref 12.0–16.0)
MCH: 22.5 pg — ABNORMAL LOW (ref 26.0–34.0)
MCHC: 31.5 g/dL — AB (ref 32.0–36.0)
MCV: 71.3 fL — AB (ref 80.0–100.0)
Platelets: 412 10*3/uL (ref 150–440)
RBC: 4.07 MIL/uL (ref 3.80–5.20)
RDW: 18.7 % — ABNORMAL HIGH (ref 11.5–14.5)
WBC: 10.9 10*3/uL (ref 3.6–11.0)

## 2017-11-05 LAB — BASIC METABOLIC PANEL
Anion gap: 10 (ref 5–15)
BUN: 9 mg/dL (ref 6–20)
CHLORIDE: 102 mmol/L (ref 98–111)
CO2: 25 mmol/L (ref 22–32)
Calcium: 8.6 mg/dL — ABNORMAL LOW (ref 8.9–10.3)
Creatinine, Ser: 1.06 mg/dL — ABNORMAL HIGH (ref 0.44–1.00)
GFR calc non Af Amer: >60 mL/min (ref >60)
Glucose, Bld: 114 mg/dL — ABNORMAL HIGH (ref 70–99)
Potassium: 2.9 mmol/L — ABNORMAL LOW (ref 3.5–5.1)
Sodium: 137 mmol/L (ref 135–145)

## 2017-11-05 LAB — URINE CULTURE: SPECIAL REQUESTS: NORMAL

## 2017-11-05 LAB — TROPONIN I: Troponin I: <0.03 ng/mL (ref <0.03)

## 2017-11-05 MED ORDER — GADOBENATE DIMEGLUMINE 529 MG/ML IV SOLN
15.0000 mL | Freq: Once | INTRAVENOUS | Status: AC | PRN
  Administered 2017-11-05: 15 mL via INTRAVENOUS

## 2017-11-05 MED ORDER — POTASSIUM CHLORIDE CRYS ER 20 MEQ PO TBCR
40.0000 meq | EXTENDED_RELEASE_TABLET | Freq: Once | ORAL | Status: AC
  Administered 2017-11-05: 40 meq via ORAL
  Filled 2017-11-05: qty 2

## 2017-11-05 MED ORDER — SODIUM CHLORIDE 0.9 % IV BOLUS
1000.0000 mL | Freq: Once | INTRAVENOUS | Status: AC
  Administered 2017-11-05: 1000 mL via INTRAVENOUS

## 2017-11-05 NOTE — ED Notes (Signed)
During this RN assessment pt falls asleep quickly between questions. PT unable to stay awake to safely administer po medication at this time. MD made aware. Will administer po potassium when pt becomes more alert.

## 2017-11-05 NOTE — ED Notes (Signed)
Patient transported to MRI 

## 2017-11-05 NOTE — ED Notes (Signed)
Pt more alert and is able to maintain conversation without falling asleep. pt's visitor at bedside

## 2017-11-05 NOTE — ED Notes (Signed)
Pt encouraged to void for U/A. Pt states she is unable to go at this time.

## 2017-11-05 NOTE — ED Triage Notes (Signed)
Pt arrives to ED via POV from home with c/o left-sided facial, arm and leg numbness since 930pm last night. Pt denies any c/o trouble swallowing or speaking. Pt with equal bilateral grips, MEAW. No visual changes, no N/V/D or fever.

## 2017-11-05 NOTE — ED Provider Notes (Signed)
[facility] Emergency Department Provider Note   ____________________________________________   First MD Initiated Contact with Patient 11/05/17 [phone]     (approximate)  I have reviewed the triage vital signs and the nursing notes.   HISTORY  Chief Complaint Numbness    HPI [name] is a 47 y.o. female who comes into the hospital today stating that her left face is numb and she cannot move it.  She reports that she also has some numbness to her left arm and her left leg.  The patient states that there is also a spot on her right arm which is concerning.  The patient was last known normal she says around 10 PM.  She states initially that it was sometime yesterday afternoon.  The patient states that she started with the difficulty speaking.  She felt weird and her voice was hoarse this evening.  The patient endorses some mild shortness of breath and some upper abdominal pain yesterday.  She reports that she is also been unable to urinate since yesterday.  She rates her pain an 8-9 out of 10 in intensity.  The patient is here today for evaluation.   Past Medical History:  Diagnosis Date  . Anxiety   . Arthritis    "joints ache all over" (10/15/2014)  . Bulging lumbar disc   . Chronic lower back pain   . DDD (degenerative disc disease), cervical   . Depression   . Drug-seeking behavior   . Headache    "weekly" (10/15/2014)  . Hyperlipemia   . Hypertension   . PTSD (post-traumatic stress disorder)   . Skin cancer    "had them cut off my arms; don't know what kind"    Patient Active Problem List   Diagnosis Date Noted  . Acute gastroenteritis 11/19/2015  . Hypomagnesemia   . AKI (acute kidney injury) ([place])   . C. difficile colitis   . SIRS (systemic inflammatory response syndrome) ([place]) 01/11/2015  . Cellulitis 01/03/2015  . Tachycardia   . Dyspnea   . Opiate withdrawal ([place]) 12/18/2014  . Depression 12/17/2014  . Hypotension 12/16/2014  .  Herpes simplex virus type 1 (HSV-1) dermatitis   . Sacral fracture ([place]) 12/12/2014  . Fall 12/12/2014  . Nausea and vomiting 12/12/2014  . Leukocytosis 12/12/2014  . Chronic lower back pain 12/12/2014  . Sacral fracture, closed ([place]) 12/12/2014  . Shingles outbreak 12/12/2014  . Nausea with vomiting   . Intractable abdominal pain 10/15/2014  . Abdominal pain 10/15/2014  . Hypokalemia 09/04/2014  . Sepsis ([place]) 09/01/2014  . Abdominal pain, generalized 09/01/2014  . PTSD (post-traumatic stress disorder) 09/01/2014  . Endometriosis 09/01/2014  . Nausea vomiting and diarrhea 09/01/2014  . Sinus tachycardia 09/01/2014  . Lactic acidosis 09/01/2014  . Severe sepsis ([place]) 09/01/2014    Past Surgical History:  Procedure Laterality Date  . ABLATION ON ENDOMETRIOSIS  2008  . HEMORRHOID SURGERY  ~ 2002    Prior to Admission medications   Medication Sig Start Date End Date Taking? Authorizing Provider  albendazole (ALBENZA) 200 MG tablet Take 2 tablets (400 mg total) by mouth once for 1 dose. 400mg  by mouth once on 11/17/2017 11/17/17 11/17/17  Delman Kitten, MD  amLODipine (NORVASC) 10 MG tablet Take 1 tablet (10 mg total) by mouth daily. 10/17/14   Kelvin Cellar, MD  cephALEXin (KEFLEX) 250 MG capsule Take 1 capsule (250 mg total) by mouth 2 (two) times daily for 7 days. 11/03/17 11/10/17  Delman Kitten,  MD  diclofenac (VOLTAREN) 50 MG EC tablet Take 1 tablet (50 mg total) by mouth 2 (two) times daily. 12/18/16   Menshew, Dannielle Karvonen, PA-C  dicyclomine (BENTYL) 10 MG capsule Take 2 capsules (20 mg total) by mouth 3 (three) times daily before meals. 10/10/15   Hower, Aaron Mose, MD  dicyclomine (BENTYL) 20 MG tablet Take 1 tablet (20 mg total) by mouth 3 (three) times daily as needed for spasms. 07/04/17 07/04/18  Gregor Hams, MD  famotidine (PEPCID) 20 MG tablet Take 1 tablet (20 mg total) by mouth 2 (two) times daily. 10/17/15   Carrie Mew, MD  FLUoxetine (PROZAC) 40 MG capsule Take 1  capsule by mouth daily. 06/26/17   [provider]  hydrochlorothiazide (HYDRODIURIL) 25 MG tablet Take 1 tablet by mouth daily. 06/26/17   [provider]  HYDROcodone-acetaminophen (NORCO) 10-325 MG per tablet Take 1 tablet by mouth every 8 (eight) hours as needed for moderate pain. 01/17/15   Barton Dubois, MD  lamoTRIgine (LAMICTAL) 100 MG tablet Take 1 tablet by mouth 2 (two) times daily. 06/13/17   [provider]  loperamide (IMODIUM A-D) 2 MG tablet Take 2 tablets (4 mg total) by mouth 4 (four) times daily as needed for diarrhea or loose stools. 10/17/15   Carrie Mew, MD  omeprazole (PRILOSEC) 20 MG capsule Take 1 capsule (20 mg total) by mouth daily. 03/13/15   Marella Chimes, PA-C  ondansetron (ZOFRAN ODT) 4 MG disintegrating tablet Take 1 tablet (4 mg total) by mouth every 8 (eight) hours as needed for nausea or vomiting. 11/28/15   Harvest Dark, MD  ondansetron (ZOFRAN ODT) 4 MG disintegrating tablet Take 1 tablet (4 mg total) by mouth every 8 (eight) hours as needed for nausea or vomiting. 07/04/17   Gregor Hams, MD  polyethylene glycol [facility]) packet Take 17 g by mouth daily. 07/01/17   Lavonia Drafts, MD  promethazine (PHENERGAN) 25 MG tablet Take 1 tablet (25 mg total) by mouth every 8 (eight) hours as needed for nausea or vomiting. 12/18/16   Menshew, Dannielle Karvonen, PA-C  saccharomyces boulardii (FLORASTOR) 250 MG capsule Take 1 capsule (250 mg total) by mouth 2 (two) times daily. 01/17/15   Barton Dubois, MD  traMADol (ULTRAM) 50 MG tablet Take 1 tablet (50 mg total) by mouth 3 (three) times daily as needed. 12/18/16   Menshew, Dannielle Karvonen, PA-C  traZODone (DESYREL) 100 MG tablet Take 1 tablet by mouth daily. 06/03/17   [provider]    Allergies Lactose intolerance (gi)  No family history on file.  Social History Social History   Tobacco Use  . Smoking status: Never Smoker  . Smokeless tobacco: Never Used  Substance  Use Topics  . Alcohol use: Yes    Comment: 10/15/2014 "I've drank before; nothing regular; don't drink now cause of RX I'm on"  . Drug use: No    Review of Systems  Constitutional: No fever/chills Eyes: No visual changes. ENT: No sore throat. Cardiovascular: Denies chest pain. Respiratory: Denies shortness of breath. Gastrointestinal: No abdominal pain.  No nausea, no vomiting.  No diarrhea.  No constipation. Genitourinary: Decreased urinary frequency  Musculoskeletal: Negative for back pain. Skin: Negative for rash. Neurological: Left-sided numbness and weakness   ____________________________________________   PHYSICAL EXAM:  VITAL SIGNS: ED Triage Vitals  Enc Vitals Group     BP 11/05/17 0434 (!) 95/49     Pulse Rate 11/05/17 0434 83     Resp  11/05/17 0434 18     Temp 11/05/17 0434 97.9 F (36.6 C)     Temp Source 11/05/17 0434 Oral     SpO2 11/05/17 0434 94 %     Weight 11/05/17 0435 180 lb (81.6 kg)     Height 11/05/17 0435 5\' 4"  (1.626 m)     Head Circumference --      Peak Flow --      Pain Score 11/05/17 0435 0     Pain Loc --      Pain Edu? --      Excl. in [place]? --     Constitutional: Alert and oriented. Well appearing and in mild distress. Eyes: Conjunctivae are normal. PERRL. EOMI. Head: Atraumatic. Nose: No congestion/rhinnorhea. Mouth/Throat: Mucous membranes are moist.  Oropharynx non-erythematous. Cardiovascular: Normal rate, regular rhythm. Grossly normal heart sounds.  Good peripheral circulation. Respiratory: Normal respiratory effort.  No retractions. Lungs CTAB. Gastrointestinal: Soft and nontender. No distention.  Positive bowel sounds Musculoskeletal: No lower extremity tenderness nor edema.   Neurologic:  Patient with some decreased sensation to the left side of her face and some mild facial droop. Initially the patient's smile was equal and then it changed. The patient also has some mild left sided pronator drift and some decreased strength  to the left lower extremity, patient has poor effort during the exam.  Skin:  Skin is warm, dry and intact.  Psychiatric: Mood and affect are normal.   ____________________________________________   LABS (all labs ordered are listed, but only abnormal results are displayed)  Labs Reviewed  CBC - Abnormal; Notable for the following components:      Result Value   Hemoglobin 9.1 (*)    HCT 29.0 (*)    MCV 71.3 (*)    MCH 22.5 (*)    MCHC 31.5 (*)    RDW 18.7 (*)    All other components within normal limits  BASIC METABOLIC PANEL - Abnormal; Notable for the following components:   Potassium 2.9 (*)    Glucose, Bld 114 (*)    Creatinine, Ser 1.06 (*)    Calcium 8.6 (*)    All other components within normal limits  TROPONIN I  ETHANOL  URINE DRUG SCREEN, QUALITATIVE ([facility] ONLY)  URINALYSIS, COMPLETE (UACMP) WITH MICROSCOPIC   ____________________________________________  EKG  ED ECG REPORT I, Loney Hering, the attending physician, personally viewed and interpreted this ECG.   Date: 11/05/2017  EKG Time: 506  Rate: 70  Rhythm: normal sinus rhythm  Axis: normal  Intervals:none  ST&T Change: Flipped T waves in lead III and T wave flattening in aVF.  ____________________________________________  RADIOLOGY  ED MD interpretation:  CT head: Stable and negative noncontrast CT appearance of the brain.  Official radiology report(s): Ct Head Wo Contrast  Result Date: 11/05/2017 CLINICAL DATA:  47 year old female with left side face and extremity numbness since 2130 hours. EXAM: CT HEAD WITHOUT CONTRAST TECHNIQUE: Contiguous axial images were obtained from the base of the skull through the vertex without intravenous contrast. COMPARISON:  [facility] CT without contrast 09/01/2015, brain MRI 06/21/2015. FINDINGS: Brain: Cerebral volume appears stable and within normal limits. Chronic partially empty sella. No midline shift, ventriculomegaly, mass effect,  evidence of mass lesion, intracranial hemorrhage or evidence of cortically based acute infarction. Gray-white matter differentiation is within normal limits throughout the brain. Vascular: No suspicious intracranial vascular hyperdensity. Skull: Negative.  Hyperostosis, normal variant. Sinuses/Orbits: Trace bubbly opacity in the left sphenoid. Otherwise the Visualized  paranasal sinuses and mastoids are stable and well pneumatized. Hypoplastic frontal sinuses (normal variant). Other: Visualized orbits and scalp soft tissues are within normal limits. IMPRESSION: Stable and negative noncontrast CT appearance of the brain. Electronically Signed   By: Genevie Ann M.D.   On: 11/05/2017 05:27    ____________________________________________   PROCEDURES  Procedure(s) performed: None  Procedures  Critical Care performed: No  ____________________________________________   INITIAL IMPRESSION / ASSESSMENT AND PLAN / ED COURSE  As part of my medical decision making, I reviewed the following data within the electronic MEDICAL RECORD NUMBER Notes from prior ED visits and [place] Controlled Substance Database  This is a 47 year old female who comes into the hospital today with some left-sided numbness and weakness.  The patient also states that she has been unable to urinate.  The patient did have some blood work drawn to include a CBC, BMP troponin and ethanol urine drug screen and urinalysis.  The patient has some mild anemia with a hemoglobin of 9.1 and she has some mild hypokalemia with a potassium of 2.9.  I did send the patient for CT scan of her head looking for possible stroke.  The CT was negative but I will also send the patient for an MRI.  We did do a bladder scan and she had over 400 mL's of urine in her bladder.  I will give the patient a liter of normal saline as her blood pressure is in the 90s we will place a Foley catheter.  The patient's care will be signed out to Dr. Jimmye Norman.  He will follow-up the  results of her MRI as well as her urinalysis and urine drug screen.  The patient will be reassessed.      ____________________________________________   FINAL CLINICAL IMPRESSION(S) / ED DIAGNOSES  Final diagnoses:  Left sided numbness  Weakness     ED Discharge Orders    None       Note:  This document was prepared using Dragon voice recognition software and may include unintentional dictation errors.    Loney Hering, MD 11/05/17 [phone]

## 2017-11-05 NOTE — ED Provider Notes (Signed)
Patient's MRI is negative, she is cleared for outpatient follow-up with her doctor.   Earleen Newport, MD 11/05/17 1016

## 2017-11-05 NOTE — ED Notes (Signed)
Dr. Webster at the bedside for pt evaluation 

## 2017-11-05 NOTE — ED Notes (Signed)
Bladder scan performed on pt, findings of 443ml. MD made aware. Verbal order for catheter placement

## 2017-11-26 ENCOUNTER — Ambulatory Visit: Payer: Medicare Other | Admitting: Podiatry

## 2017-11-27 ENCOUNTER — Other Ambulatory Visit: Payer: Self-pay

## 2017-11-27 ENCOUNTER — Encounter (HOSPITAL_COMMUNITY): Payer: Self-pay

## 2017-11-27 ENCOUNTER — Emergency Department (HOSPITAL_COMMUNITY)
Admission: EM | Admit: 2017-11-27 | Discharge: 2017-11-27 | Disposition: A | Discharge status: Home / Self Care | Payer: Medicare Other | Attending: Emergency Medicine | Admitting: Emergency Medicine

## 2017-11-27 DIAGNOSIS — R1084 Generalized abdominal pain: Secondary | ICD-10-CM | POA: Insufficient documentation

## 2017-11-27 DIAGNOSIS — I10 Essential (primary) hypertension: Secondary | ICD-10-CM | POA: Diagnosis not present

## 2017-11-27 DIAGNOSIS — R112 Nausea with vomiting, unspecified: Secondary | ICD-10-CM | POA: Diagnosis not present

## 2017-11-27 DIAGNOSIS — Z79899 Other long term (current) drug therapy: Secondary | ICD-10-CM | POA: Diagnosis not present

## 2017-11-27 DIAGNOSIS — E785 Hyperlipidemia, unspecified: Secondary | ICD-10-CM | POA: Diagnosis not present

## 2017-11-27 LAB — URINALYSIS, ROUTINE W REFLEX MICROSCOPIC
Bilirubin Urine: NEGATIVE
Glucose, UA: NEGATIVE mg/dL
Hgb urine dipstick: NEGATIVE
Ketones, ur: NEGATIVE mg/dL
LEUKOCYTES UA: NEGATIVE
NITRITE: NEGATIVE
PROTEIN: NEGATIVE mg/dL
Specific Gravity, Urine: 1.008 (ref 1.005–1.030)
pH: 7 (ref 5.0–8.0)

## 2017-11-27 LAB — COMPREHENSIVE METABOLIC PANEL
ALT: 12 U/L (ref 0–44)
ANION GAP: 10 (ref 5–15)
AST: 16 U/L (ref 15–41)
Albumin: 3.6 g/dL (ref 3.5–5.0)
Alkaline Phosphatase: 100 U/L (ref 38–126)
BUN: <5 mg/dL — ABNORMAL LOW (ref 6–20)
CHLORIDE: 104 mmol/L (ref 98–111)
CO2: 26 mmol/L (ref 22–32)
Calcium: 8.8 mg/dL — ABNORMAL LOW (ref 8.9–10.3)
Creatinine, Ser: 0.68 mg/dL (ref 0.44–1.00)
GFR calc Af Amer: >60 mL/min (ref >60)
GFR calc non Af Amer: >60 mL/min (ref >60)
Glucose, Bld: 110 mg/dL — ABNORMAL HIGH (ref 70–99)
POTASSIUM: 3.5 mmol/L (ref 3.5–5.1)
Sodium: 140 mmol/L (ref 135–145)
Total Bilirubin: 0.3 mg/dL (ref 0.3–1.2)
Total Protein: 7.3 g/dL (ref 6.5–8.1)

## 2017-11-27 LAB — CBC WITH DIFFERENTIAL/PLATELET
BASOS ABS: 0 10*3/uL (ref 0.0–0.1)
BASOS PCT: 0 %
EOS PCT: 1 %
Eosinophils Absolute: 0.1 10*3/uL (ref 0.0–0.7)
HCT: 34.3 % — ABNORMAL LOW (ref 36.0–46.0)
Hemoglobin: 10.4 g/dL — ABNORMAL LOW (ref 12.0–15.0)
LYMPHS PCT: 13 %
Lymphs Abs: 1.4 10*3/uL (ref 0.7–4.0)
MCH: 22.9 pg — ABNORMAL LOW (ref 26.0–34.0)
MCHC: 30.3 g/dL (ref 30.0–36.0)
MCV: 75.4 fL — AB (ref 78.0–100.0)
MONO ABS: 0.7 10*3/uL (ref 0.1–1.0)
MONOS PCT: 7 %
NEUTROS ABS: 8.1 10*3/uL — AB (ref 1.7–7.7)
Neutrophils Relative %: 79 %
PLATELETS: 448 10*3/uL — AB (ref 150–400)
RBC: 4.55 MIL/uL (ref 3.87–5.11)
RDW: 18.6 % — AB (ref 11.5–15.5)
WBC: 10.4 10*3/uL (ref 4.0–10.5)

## 2017-11-27 LAB — LIPASE, BLOOD: LIPASE: 23 U/L (ref 11–51)

## 2017-11-27 MED ORDER — DICYCLOMINE HCL 20 MG PO TABS: 20.0000 mg | ORAL_TABLET | Freq: Three times a day (TID) | ORAL | 0 refills | Status: DC

## 2017-11-27 MED ORDER — MORPHINE SULFATE (PF) 4 MG/ML IV SOLN
4.0000 mg | Freq: Once | INTRAVENOUS | Status: AC
Start: 2017-11-27 — End: 2017-11-27
  Administered 2017-11-27: 4 mg via INTRAVENOUS
  Filled 2017-11-27: qty 1

## 2017-11-27 MED ORDER — SODIUM CHLORIDE 0.9 % IV BOLUS
1000.0000 mL | Freq: Once | INTRAVENOUS | Status: AC
  Administered 2017-11-27: 1000 mL via INTRAVENOUS

## 2017-11-27 MED ORDER — METOCLOPRAMIDE HCL 5 MG/ML IJ SOLN
5.0000 mg | Freq: Once | INTRAMUSCULAR | Status: AC
  Administered 2017-11-27: 5 mg via INTRAVENOUS
  Filled 2017-11-27: qty 2

## 2017-11-27 MED ORDER — ONDANSETRON HCL 4 MG/2ML IJ SOLN
4.0000 mg | Freq: Once | INTRAMUSCULAR | Status: AC
  Administered 2017-11-27: 4 mg via INTRAVENOUS
  Filled 2017-11-27: qty 2

## 2017-11-27 MED ORDER — ONDANSETRON HCL 4 MG PO TABS: 4.0000 mg | ORAL_TABLET | Freq: Four times a day (QID) | ORAL | 0 refills | Status: DC

## 2017-11-27 NOTE — ED Provider Notes (Signed)
[facility] DEPT Provider Note   CSN: [id number] Arrival date & time: 11/27/17  0116     History   Chief Complaint Chief Complaint  Patient presents with  . Emesis  . Abdominal Pain    HPI [name] is a 47 y.o. female.  Patient presents to the emergency department for evaluation of abdominal pain, nausea and vomiting.  Patient reports "I have worms somewhere in my body".  She reports that she was seen at [facility] and treated for possible worms with albendazole.  She reports that she thinks that the worms are still present because she is experiencing diffuse abdominal pain with persistent nausea and vomiting.  She thinks that she has seen worms in her vomit.  She presents with a tupperware full of her own vomit.     Past Medical History:  Diagnosis Date  . Anxiety   . Arthritis    "joints ache all over" (10/15/2014)  . Bulging lumbar disc   . Chronic lower back pain   . DDD (degenerative disc disease), cervical   . Depression   . Drug-seeking behavior   . Headache    "weekly" (10/15/2014)  . Hyperlipemia   . Hypertension   . PTSD (post-traumatic stress disorder)   . Skin cancer    "had them cut off my arms; don't know what kind"    Patient Active Problem List   Diagnosis Date Noted  . Acute gastroenteritis 11/19/2015  . Hypomagnesemia   . AKI (acute kidney injury) ([place])   . C. difficile colitis   . SIRS (systemic inflammatory response syndrome) ([place]) 01/11/2015  . Cellulitis 01/03/2015  . Tachycardia   . Dyspnea   . Opiate withdrawal ([place]) 12/18/2014  . Depression 12/17/2014  . Hypotension 12/16/2014  . Herpes simplex virus type 1 (HSV-1) dermatitis   . Sacral fracture ([place]) 12/12/2014  . Fall 12/12/2014  . Nausea and vomiting 12/12/2014  . Leukocytosis 12/12/2014  . Chronic lower back pain 12/12/2014  . Sacral fracture, closed ([place]) 12/12/2014  . Shingles outbreak 12/12/2014  . Nausea with vomiting   .  Intractable abdominal pain 10/15/2014  . Abdominal pain 10/15/2014  . Hypokalemia 09/04/2014  . Sepsis ([place]) 09/01/2014  . Abdominal pain, generalized 09/01/2014  . PTSD (post-traumatic stress disorder) 09/01/2014  . Endometriosis 09/01/2014  . Nausea vomiting and diarrhea 09/01/2014  . Sinus tachycardia 09/01/2014  . Lactic acidosis 09/01/2014  . Severe sepsis ([place]) 09/01/2014    Past Surgical History:  Procedure Laterality Date  . ABLATION ON ENDOMETRIOSIS  2008  . HEMORRHOID SURGERY  ~ 2002     OB History   None      Home Medications    Prior to Admission medications   Medication Sig Start Date End Date Taking? Authorizing Provider  FLUoxetine (PROZAC) 40 MG capsule Take 40 mg by mouth daily.  06/26/17  Yes [provider]  hydrochlorothiazide (HYDRODIURIL) 25 MG tablet Take 25 mg by mouth daily.  06/26/17  Yes [provider]  ibuprofen (ADVIL,MOTRIN) 200 MG tablet Take 400 mg by mouth every 6 (six) hours as needed for moderate pain.   Yes [provider]  lamoTRIgine (LAMICTAL) 150 MG tablet Take 150 mg by mouth 2 (two) times daily.   Yes [provider]  lisinopril (PRINIVIL,ZESTRIL) 20 MG tablet Take 20 mg by mouth daily.   Yes [provider]  omeprazole (PRILOSEC) 20 MG capsule Take 1 capsule (20 mg total) by mouth daily. 03/13/15  Yes  Marella Chimes, PA-C  promethazine (PHENERGAN) 25 MG tablet Take 1 tablet (25 mg total) by mouth every 8 (eight) hours as needed for nausea or vomiting. 12/18/16  Yes Menshew, Dannielle Karvonen, PA-C  tiZANidine (ZANAFLEX) 4 MG tablet Take 4 mg by mouth every 8 (eight) hours as needed for muscle spasms.   Yes [provider]  traZODone (DESYREL) 100 MG tablet Take 100 mg by mouth at bedtime.  06/03/17  Yes [provider]  dicyclomine (BENTYL) 20 MG tablet Take 1 tablet (20 mg total) by mouth 3 (three) times daily before meals. 11/27/17   Orpah Greek, MD  ondansetron  (ZOFRAN) 4 MG tablet Take 1 tablet (4 mg total) by mouth every 6 (six) hours. 11/27/17   Orpah Greek, MD    Family History History reviewed. No pertinent family history.  Social History Social History   Tobacco Use  . Smoking status: Never Smoker  . Smokeless tobacco: Never Used  Substance Use Topics  . Alcohol use: Yes    Comment: 10/15/2014 "I've drank before; nothing regular; don't drink now cause of RX I'm on"  . Drug use: No     Allergies   Lactose intolerance (gi)   Review of Systems Review of Systems  Gastrointestinal: Positive for abdominal pain, nausea and vomiting.  All other systems reviewed and are negative.    Physical Exam Updated Vital Signs BP 110/67   Pulse 70   Temp 98.1 F (36.7 C) (Oral)   Resp 18   Ht 5\' 4"  (1.626 m)   Wt 78 kg (172 lb)   SpO2 99%   BMI 29.52 kg/m   Physical Exam  Constitutional: She is oriented to person, place, and time. She appears well-developed and well-nourished. No distress.  HENT:  Head: Normocephalic and atraumatic.  Right Ear: Hearing normal.  Left Ear: Hearing normal.  Nose: Nose normal.  Mouth/Throat: Oropharynx is clear and moist and mucous membranes are normal.  Eyes: Pupils are equal, round, and reactive to light. Conjunctivae and EOM are normal.  Neck: Normal range of motion. Neck supple.  Cardiovascular: Regular rhythm, S1 normal and S2 normal. Exam reveals no gallop and no friction rub.  No murmur heard. Pulmonary/Chest: Effort normal and breath sounds normal. No respiratory distress. She exhibits no tenderness.  Abdominal: Soft. Normal appearance and bowel sounds are normal. There is no hepatosplenomegaly. There is generalized tenderness. There is no rebound, no guarding, no tenderness at McBurney's point and negative Murphy's sign. No hernia.  Musculoskeletal: Normal range of motion.  Neurological: She is alert and oriented to person, place, and time. She has normal strength. No cranial nerve  deficit or sensory deficit. Coordination normal. GCS eye subscore is 4. GCS verbal subscore is 5. GCS motor subscore is 6.  Skin: Skin is warm, dry and intact. No rash noted. No cyanosis.  Psychiatric: She has a normal mood and affect. Her speech is normal and behavior is normal. Thought content normal.  Nursing note and vitals reviewed.    ED Treatments / Results  Labs (all labs ordered are listed, but only abnormal results are displayed) Labs Reviewed  CBC WITH DIFFERENTIAL/PLATELET - Abnormal; Notable for the following components:      Result Value   Hemoglobin 10.4 (*)    HCT 34.3 (*)    MCV 75.4 (*)    MCH 22.9 (*)    RDW 18.6 (*)    Platelets 448 (*)    Neutro Abs 8.1 (*)  All other components within normal limits  COMPREHENSIVE METABOLIC PANEL - Abnormal; Notable for the following components:   Glucose, Bld 110 (*)    BUN <5 (*)    Calcium 8.8 (*)    All other components within normal limits  GASTROINTESTINAL PANEL BY PCR, STOOL (REPLACES STOOL CULTURE)  PINWORM PREP  LIPASE, BLOOD  URINALYSIS, ROUTINE W REFLEX MICROSCOPIC    EKG None  Radiology No results found.  Procedures Procedures (including critical care time)  Medications Ordered in ED Medications  sodium chloride 0.9 % bolus 1,000 mL (0 mLs Intravenous Stopped 11/27/17 0433)  ondansetron (ZOFRAN) injection 4 mg (4 mg Intravenous Given 11/27/17 0216)     Initial Impression / Assessment and Plan / ED Course  I have reviewed the triage vital signs and the nursing notes.  Pertinent labs & imaging results that were available during my care of the patient were reviewed by me and considered in my medical decision making (see chart for details).     Patient presents to the emergency department for evaluation of abdominal pain, nausea and vomiting.  Patient continues to have concern over intestinal parasites.  She reports that she was treated for possible intestinal worms at [facility]  Records have  been reviewed.  No worms were seen in the ER, she was treated presumptively.  Patient's records indicate a history of chronic abdominal pain.  She complains of central abdominal pain but abdominal exam is relatively benign.  Reviewing her records reveals countless CTs of her abdomen and pelvis, I do not believe she requires one today.  Lab work is unremarkable.  Doubt persistent intestinal worms despite 2 doses of albendazole.  Patient would likely benefit from PCP follow-up and GI follow-up.  Final Clinical Impressions(s) / ED Diagnoses   Final diagnoses:  Generalized abdominal pain  Non-intractable vomiting with nausea, unspecified vomiting type    ED Discharge Orders        Ordered    dicyclomine (BENTYL) 20 MG tablet  3 times daily before meals     11/27/17 0435    ondansetron (ZOFRAN) 4 MG tablet  Every 6 hours     11/27/17 0435       Orpah Greek, MD 11/27/17 [phone]

## 2017-11-27 NOTE — ED Triage Notes (Signed)
Pt presents to ED from home for ABD pain and emesis. Pt reports that she has "worms" and she was treated at another hospital. Pt reports that she is still vomiting, and can't eat.

## 2017-11-27 NOTE — Discharge Instructions (Addendum)
Schedule follow-up with your gastroenterologist

## 2017-11-29 LAB — PINWORM PREP: PINWORM PREP - ENTEROBIUS: NONE SEEN

## 2017-12-11 DIAGNOSIS — R399 Unspecified symptoms and signs involving the genitourinary system: Secondary | ICD-10-CM | POA: Diagnosis not present

## 2017-12-11 DIAGNOSIS — I10 Essential (primary) hypertension: Secondary | ICD-10-CM | POA: Diagnosis not present

## 2017-12-11 DIAGNOSIS — L906 Striae atrophicae: Secondary | ICD-10-CM | POA: Diagnosis not present

## 2017-12-11 DIAGNOSIS — D1801 Hemangioma of skin and subcutaneous tissue: Secondary | ICD-10-CM | POA: Diagnosis not present

## 2017-12-11 DIAGNOSIS — R109 Unspecified abdominal pain: Secondary | ICD-10-CM | POA: Diagnosis not present

## 2017-12-11 DIAGNOSIS — R110 Nausea: Secondary | ICD-10-CM | POA: Diagnosis not present

## 2017-12-22 DIAGNOSIS — F22 Delusional disorders: Secondary | ICD-10-CM | POA: Diagnosis not present

## 2017-12-22 DIAGNOSIS — G8929 Other chronic pain: Secondary | ICD-10-CM | POA: Diagnosis not present

## 2017-12-22 DIAGNOSIS — I10 Essential (primary) hypertension: Secondary | ICD-10-CM | POA: Diagnosis not present

## 2017-12-22 DIAGNOSIS — R112 Nausea with vomiting, unspecified: Secondary | ICD-10-CM | POA: Diagnosis not present

## 2017-12-22 DIAGNOSIS — K760 Fatty (change of) liver, not elsewhere classified: Secondary | ICD-10-CM | POA: Diagnosis not present

## 2017-12-22 DIAGNOSIS — R935 Abnormal findings on diagnostic imaging of other abdominal regions, including retroperitoneum: Secondary | ICD-10-CM | POA: Diagnosis not present

## 2017-12-22 DIAGNOSIS — K219 Gastro-esophageal reflux disease without esophagitis: Secondary | ICD-10-CM | POA: Diagnosis not present

## 2017-12-22 DIAGNOSIS — E7800 Pure hypercholesterolemia, unspecified: Secondary | ICD-10-CM | POA: Diagnosis not present

## 2017-12-22 DIAGNOSIS — Z79899 Other long term (current) drug therapy: Secondary | ICD-10-CM | POA: Diagnosis not present

## 2017-12-22 DIAGNOSIS — K7689 Other specified diseases of liver: Secondary | ICD-10-CM | POA: Diagnosis not present

## 2017-12-22 DIAGNOSIS — E860 Dehydration: Secondary | ICD-10-CM | POA: Diagnosis not present

## 2017-12-22 DIAGNOSIS — E876 Hypokalemia: Secondary | ICD-10-CM | POA: Diagnosis not present

## 2017-12-22 DIAGNOSIS — F429 Obsessive-compulsive disorder, unspecified: Secondary | ICD-10-CM | POA: Diagnosis not present

## 2017-12-22 DIAGNOSIS — D72829 Elevated white blood cell count, unspecified: Secondary | ICD-10-CM | POA: Diagnosis not present

## 2017-12-22 DIAGNOSIS — R443 Hallucinations, unspecified: Secondary | ICD-10-CM | POA: Diagnosis not present

## 2017-12-22 DIAGNOSIS — F323 Major depressive disorder, single episode, severe with psychotic features: Secondary | ICD-10-CM | POA: Diagnosis not present

## 2017-12-23 DIAGNOSIS — D72829 Elevated white blood cell count, unspecified: Secondary | ICD-10-CM | POA: Diagnosis not present

## 2017-12-23 DIAGNOSIS — R4182 Altered mental status, unspecified: Secondary | ICD-10-CM | POA: Diagnosis not present

## 2017-12-23 DIAGNOSIS — K7689 Other specified diseases of liver: Secondary | ICD-10-CM | POA: Diagnosis not present

## 2017-12-24 DIAGNOSIS — K760 Fatty (change of) liver, not elsewhere classified: Secondary | ICD-10-CM | POA: Diagnosis not present

## 2017-12-25 DIAGNOSIS — K219 Gastro-esophageal reflux disease without esophagitis: Secondary | ICD-10-CM | POA: Diagnosis present

## 2017-12-25 DIAGNOSIS — F22 Delusional disorders: Secondary | ICD-10-CM | POA: Diagnosis not present

## 2017-12-25 DIAGNOSIS — E7800 Pure hypercholesterolemia, unspecified: Secondary | ICD-10-CM | POA: Diagnosis not present

## 2017-12-25 DIAGNOSIS — R935 Abnormal findings on diagnostic imaging of other abdominal regions, including retroperitoneum: Secondary | ICD-10-CM | POA: Diagnosis not present

## 2017-12-25 DIAGNOSIS — F429 Obsessive-compulsive disorder, unspecified: Secondary | ICD-10-CM | POA: Diagnosis not present

## 2017-12-25 DIAGNOSIS — I10 Essential (primary) hypertension: Secondary | ICD-10-CM | POA: Diagnosis present

## 2017-12-25 DIAGNOSIS — F323 Major depressive disorder, single episode, severe with psychotic features: Secondary | ICD-10-CM | POA: Diagnosis not present

## 2017-12-25 DIAGNOSIS — F3113 Bipolar disorder, current episode manic without psychotic features, severe: Secondary | ICD-10-CM | POA: Diagnosis not present

## 2017-12-25 DIAGNOSIS — Z9114 Patient's other noncompliance with medication regimen: Secondary | ICD-10-CM | POA: Diagnosis not present

## 2017-12-25 DIAGNOSIS — F312 Bipolar disorder, current episode manic severe with psychotic features: Secondary | ICD-10-CM | POA: Diagnosis present

## 2017-12-25 DIAGNOSIS — M1990 Unspecified osteoarthritis, unspecified site: Secondary | ICD-10-CM | POA: Diagnosis present

## 2018-01-06 DIAGNOSIS — F250 Schizoaffective disorder, bipolar type: Secondary | ICD-10-CM | POA: Diagnosis not present

## 2018-01-14 DIAGNOSIS — M25531 Pain in right wrist: Secondary | ICD-10-CM | POA: Diagnosis not present

## 2018-01-14 DIAGNOSIS — M25372 Other instability, left ankle: Secondary | ICD-10-CM | POA: Diagnosis not present

## 2018-01-24 DIAGNOSIS — S86312D Strain of muscle(s) and tendon(s) of peroneal muscle group at lower leg level, left leg, subsequent encounter: Secondary | ICD-10-CM | POA: Diagnosis not present

## 2018-01-24 DIAGNOSIS — M25372 Other instability, left ankle: Secondary | ICD-10-CM | POA: Diagnosis not present

## 2018-01-24 DIAGNOSIS — R262 Difficulty in walking, not elsewhere classified: Secondary | ICD-10-CM | POA: Diagnosis not present

## 2018-01-24 DIAGNOSIS — M6281 Muscle weakness (generalized): Secondary | ICD-10-CM | POA: Diagnosis not present

## 2018-02-06 DIAGNOSIS — R262 Difficulty in walking, not elsewhere classified: Secondary | ICD-10-CM | POA: Diagnosis not present

## 2018-02-06 DIAGNOSIS — M6281 Muscle weakness (generalized): Secondary | ICD-10-CM | POA: Diagnosis not present

## 2018-02-06 DIAGNOSIS — M25672 Stiffness of left ankle, not elsewhere classified: Secondary | ICD-10-CM | POA: Diagnosis not present

## 2018-02-06 DIAGNOSIS — S86312D Strain of muscle(s) and tendon(s) of peroneal muscle group at lower leg level, left leg, subsequent encounter: Secondary | ICD-10-CM | POA: Diagnosis not present

## 2018-02-11 DIAGNOSIS — M25372 Other instability, left ankle: Secondary | ICD-10-CM | POA: Diagnosis not present

## 2018-02-11 DIAGNOSIS — M6281 Muscle weakness (generalized): Secondary | ICD-10-CM | POA: Diagnosis not present

## 2018-02-11 DIAGNOSIS — R262 Difficulty in walking, not elsewhere classified: Secondary | ICD-10-CM | POA: Diagnosis not present

## 2018-02-11 DIAGNOSIS — S86312D Strain of muscle(s) and tendon(s) of peroneal muscle group at lower leg level, left leg, subsequent encounter: Secondary | ICD-10-CM | POA: Diagnosis not present

## 2018-02-13 DIAGNOSIS — S86312D Strain of muscle(s) and tendon(s) of peroneal muscle group at lower leg level, left leg, subsequent encounter: Secondary | ICD-10-CM | POA: Diagnosis not present

## 2018-02-13 DIAGNOSIS — R262 Difficulty in walking, not elsewhere classified: Secondary | ICD-10-CM | POA: Diagnosis not present

## 2018-02-13 DIAGNOSIS — M25372 Other instability, left ankle: Secondary | ICD-10-CM | POA: Diagnosis not present

## 2018-02-13 DIAGNOSIS — M6281 Muscle weakness (generalized): Secondary | ICD-10-CM | POA: Diagnosis not present

## 2018-02-19 DIAGNOSIS — M6281 Muscle weakness (generalized): Secondary | ICD-10-CM | POA: Diagnosis not present

## 2018-02-19 DIAGNOSIS — R262 Difficulty in walking, not elsewhere classified: Secondary | ICD-10-CM | POA: Diagnosis not present

## 2018-02-19 DIAGNOSIS — S86312D Strain of muscle(s) and tendon(s) of peroneal muscle group at lower leg level, left leg, subsequent encounter: Secondary | ICD-10-CM | POA: Diagnosis not present

## 2018-02-19 DIAGNOSIS — M25372 Other instability, left ankle: Secondary | ICD-10-CM | POA: Diagnosis not present

## 2018-02-21 DIAGNOSIS — M25372 Other instability, left ankle: Secondary | ICD-10-CM | POA: Diagnosis not present

## 2018-02-21 DIAGNOSIS — S86312D Strain of muscle(s) and tendon(s) of peroneal muscle group at lower leg level, left leg, subsequent encounter: Secondary | ICD-10-CM | POA: Diagnosis not present

## 2018-02-21 DIAGNOSIS — R262 Difficulty in walking, not elsewhere classified: Secondary | ICD-10-CM | POA: Diagnosis not present

## 2018-02-21 DIAGNOSIS — M6281 Muscle weakness (generalized): Secondary | ICD-10-CM | POA: Diagnosis not present

## 2018-03-14 ENCOUNTER — Encounter (HOSPITAL_COMMUNITY): Payer: Self-pay | Admitting: Emergency Medicine

## 2018-03-14 ENCOUNTER — Emergency Department (HOSPITAL_COMMUNITY)
Admission: EM | Admit: 2018-03-14 | Discharge: 2018-03-15 | Disposition: A | Discharge status: Home / Self Care | Payer: Medicare Other | Attending: Emergency Medicine | Admitting: Emergency Medicine

## 2018-03-14 ENCOUNTER — Emergency Department (HOSPITAL_COMMUNITY): Payer: Medicare Other

## 2018-03-14 ENCOUNTER — Other Ambulatory Visit: Payer: Self-pay

## 2018-03-14 DIAGNOSIS — M542 Cervicalgia: Secondary | ICD-10-CM | POA: Diagnosis not present

## 2018-03-14 DIAGNOSIS — R030 Elevated blood-pressure reading, without diagnosis of hypertension: Secondary | ICD-10-CM | POA: Insufficient documentation

## 2018-03-14 DIAGNOSIS — H538 Other visual disturbances: Secondary | ICD-10-CM | POA: Diagnosis not present

## 2018-03-14 DIAGNOSIS — M25519 Pain in unspecified shoulder: Secondary | ICD-10-CM | POA: Insufficient documentation

## 2018-03-14 DIAGNOSIS — R200 Anesthesia of skin: Secondary | ICD-10-CM | POA: Insufficient documentation

## 2018-03-14 DIAGNOSIS — Z79899 Other long term (current) drug therapy: Secondary | ICD-10-CM | POA: Diagnosis not present

## 2018-03-14 DIAGNOSIS — R202 Paresthesia of skin: Secondary | ICD-10-CM | POA: Diagnosis not present

## 2018-03-14 DIAGNOSIS — I10 Essential (primary) hypertension: Secondary | ICD-10-CM | POA: Insufficient documentation

## 2018-03-14 DIAGNOSIS — R51 Headache: Secondary | ICD-10-CM | POA: Diagnosis not present

## 2018-03-14 DIAGNOSIS — M25511 Pain in right shoulder: Secondary | ICD-10-CM | POA: Diagnosis not present

## 2018-03-14 DIAGNOSIS — R519 Headache, unspecified: Secondary | ICD-10-CM

## 2018-03-14 DIAGNOSIS — H539 Unspecified visual disturbance: Secondary | ICD-10-CM

## 2018-03-14 MED ORDER — ACETAMINOPHEN 500 MG PO TABS
1000.0000 mg | ORAL_TABLET | Freq: Once | ORAL | Status: AC
  Administered 2018-03-14: 1000 mg via ORAL
  Filled 2018-03-14: qty 2

## 2018-03-14 MED ORDER — LORAZEPAM 2 MG/ML IJ SOLN
1.0000 mg | Freq: Once | INTRAMUSCULAR | Status: AC
  Administered 2018-03-14: 1 mg via INTRAVENOUS
  Filled 2018-03-14: qty 1

## 2018-03-14 MED ORDER — LORAZEPAM 1 MG PO TABS: 1.0000 mg | ORAL_TABLET | Freq: Once | ORAL | Status: DC

## 2018-03-14 MED ORDER — DIAZEPAM 5 MG PO TABS: 5.0000 mg | ORAL_TABLET | Freq: Once | ORAL | Status: DC

## 2018-03-14 MED ORDER — LORAZEPAM 2 MG/ML IJ SOLN: 0.5000 mg | Freq: Once | INTRAMUSCULAR | Status: DC

## 2018-03-14 MED ORDER — ONDANSETRON HCL 4 MG PO TABS
4.0000 mg | ORAL_TABLET | Freq: Once | ORAL | Status: AC
  Administered 2018-03-14: 4 mg via ORAL
  Filled 2018-03-14: qty 1

## 2018-03-14 MED ORDER — SODIUM CHLORIDE 0.9 % IV BOLUS
1000.0000 mL | Freq: Once | INTRAVENOUS | Status: AC
  Administered 2018-03-15: 1000 mL via INTRAVENOUS

## 2018-03-14 MED ORDER — LIDOCAINE 5 % EX PTCH
1.0000 | MEDICATED_PATCH | CUTANEOUS | Status: DC
  Administered 2018-03-14: 1 via TRANSDERMAL
  Filled 2018-03-14: qty 1

## 2018-03-14 NOTE — ED Provider Notes (Signed)
22:00: Assumed care from [facility] PA-C at change of shift pending labs and imaging.   Please see prior provider note for full H&P.   Briefly patient is a 47 year old female with a hx of anxiety, depression, cervical DDD, and PTSD presented to the ED with quick onset R sided facial pain radiating to the back of the head & into the neck with R eye blurry vision & facial paresthesias.    Physical Exam  BP (!) 156/94 (BP Location: Right Arm)   Pulse 86   Temp 97.8 F (36.6 C) (Oral)   Resp 16   SpO2 99%   Physical Exam  Constitutional: She appears well-developed and well-nourished. No distress.  HENT:  Head: Normocephalic and atraumatic.  Eyes: Conjunctivae are normal. Right eye exhibits no discharge. Left eye exhibits no discharge.  Neurological: She is alert.  Clear speech. CNIII-XII grossly intact.   Psychiatric: She has a normal mood and affect. Her behavior is normal. Thought content normal.  Nursing note and vitals reviewed.   ED Course/Procedures     Procedures  Results for orders placed or performed during the hospital encounter of 83/38/25  Basic metabolic panel  Result Value Ref Range   Sodium 137 135 - 145 mmol/L   Potassium 3.6 3.5 - 5.1 mmol/L   Chloride 100 98 - 111 mmol/L   CO2 26 22 - 32 mmol/L   Glucose, Bld 92 70 - 99 mg/dL   BUN 16 6 - 20 mg/dL   Creatinine, Ser 1.10 (H) 0.44 - 1.00 mg/dL   Calcium 9.0 8.9 - 10.3 mg/dL   GFR calc non Af Amer 59 (L) >60 mL/min   GFR calc Af Amer >60 >60 mL/min   Anion gap 11 5 - 15  CBC with Differential  Result Value Ref Range   WBC 9.6 4.0 - 10.5 K/uL   RBC 3.93 3.87 - 5.11 MIL/uL   Hemoglobin 9.3 (L) 12.0 - 15.0 g/dL   HCT 31.6 (L) 36.0 - 46.0 %   MCV 80.4 80.0 - 100.0 fL   MCH 23.7 (L) 26.0 - 34.0 pg   MCHC 29.4 (L) 30.0 - 36.0 g/dL   RDW 17.1 (H) 11.5 - 15.5 %   Platelets 376 150 - 400 K/uL   nRBC 0.0 0.0 - 0.2 %   Neutrophils Relative % 68 %   Neutro Abs 6.6 1.7 - 7.7 K/uL   Lymphocytes Relative 21 %    Lymphs Abs 2.0 0.7 - 4.0 K/uL   Monocytes Relative 6 %   Monocytes Absolute 0.6 0.1 - 1.0 K/uL   Eosinophils Relative 3 %   Eosinophils Absolute 0.3 0.0 - 0.5 K/uL   Basophils Relative 1 %   Basophils Absolute 0.1 0.0 - 0.1 K/uL   Immature Granulocytes 1 %   Abs Immature Granulocytes 0.06 0.00 - 0.07 K/uL  I-Stat Beta hCG blood, ED ([facility], [facility], [facility] only)  Result Value Ref Range   I-stat hCG, quantitative <5.0 <5 mIU/mL   Comment 3           Ct Angio Head W Or Wo Contrast  Result Date: 03/15/2018 CLINICAL DATA:  Right-sided face and neck pain. Facial numbness. EXAM: CT ANGIOGRAPHY HEAD AND NECK TECHNIQUE: Multidetector CT imaging of the head and neck was performed using the standard protocol during bolus administration of intravenous contrast. Multiplanar CT image reconstructions and MIPs were obtained to evaluate the vascular anatomy. Carotid stenosis measurements (when applicable) are obtained utilizing NASCET criteria, using the  distal internal carotid diameter as the denominator. CONTRAST:  148mL ISOVUE-370 IOPAMIDOL (ISOVUE-370) INJECTION 76% COMPARISON:  Brain MRI 03/14/2018 FINDINGS: CT HEAD FINDINGS Brain: There is no mass, hemorrhage or extra-axial collection. The size and configuration of the ventricles and extra-axial CSF spaces are normal. There is no acute or chronic infarction. The brain parenchyma is normal. Skull: The visualized skull base, calvarium and extracranial soft tissues are normal. Sinuses/Orbits: No fluid levels or advanced mucosal thickening of the visualized paranasal sinuses. No mastoid or middle ear effusion. The orbits are normal. CTA NECK FINDINGS SKELETON: There is no bony spinal canal stenosis. No lytic or blastic lesion. OTHER NECK: Normal pharynx, larynx and major salivary glands. No cervical lymphadenopathy. Unremarkable thyroid gland. UPPER CHEST: No pneumothorax or pleural effusion. No nodules or masses. AORTIC ARCH: There is no calcific atherosclerosis of the  aortic arch. There is no aneurysm, dissection or hemodynamically significant stenosis of the visualized ascending aorta and aortic arch. Conventional 3 vessel aortic branching pattern. The visualized proximal subclavian arteries are widely patent. RIGHT CAROTID SYSTEM: --Common carotid artery: Widely patent origin without common carotid artery dissection or aneurysm. --Internal carotid artery: Normal without aneurysm, dissection or stenosis. --External carotid artery: No acute abnormality. LEFT CAROTID SYSTEM: --Common carotid artery: Widely patent origin without common carotid artery dissection or aneurysm. --Internal carotid artery: Normal without aneurysm, dissection or stenosis. --External carotid artery: No acute abnormality. VERTEBRAL ARTERIES: Left dominant configuration. Both origins are normal. No dissection, occlusion or flow-limiting stenosis to the vertebrobasilar confluence. CTA HEAD FINDINGS ANTERIOR CIRCULATION: --Intracranial internal carotid arteries: Normal. --Anterior cerebral arteries: Normal. Both A1 segments are present. Patent anterior communicating artery. --Middle cerebral arteries: Normal. --Posterior communicating arteries: Present bilaterally. POSTERIOR CIRCULATION: --Basilar artery: Normal. --Posterior cerebral arteries: Normal. --Superior cerebellar arteries: Normal. --Inferior cerebellar arteries: Normal anterior and posterior inferior cerebellar arteries. VENOUS SINUSES: As permitted by contrast timing, patent. ANATOMIC VARIANTS: None DELAYED PHASE: No parenchymal contrast enhancement. Review of the MIP images confirms the above findings. IMPRESSION: Normal CTA of the head and neck. Electronically Signed   By: Ulyses Jarred M.D.   On: 03/15/2018 01:30   Ct Angio Neck W And/or Wo Contrast  Result Date: 03/15/2018 CLINICAL DATA:  Right-sided face and neck pain. Facial numbness. EXAM: CT ANGIOGRAPHY HEAD AND NECK TECHNIQUE: Multidetector CT imaging of the head and neck was performed  using the standard protocol during bolus administration of intravenous contrast. Multiplanar CT image reconstructions and MIPs were obtained to evaluate the vascular anatomy. Carotid stenosis measurements (when applicable) are obtained utilizing NASCET criteria, using the distal internal carotid diameter as the denominator. CONTRAST:  1105mL ISOVUE-370 IOPAMIDOL (ISOVUE-370) INJECTION 76% COMPARISON:  Brain MRI 03/14/2018 FINDINGS: CT HEAD FINDINGS Brain: There is no mass, hemorrhage or extra-axial collection. The size and configuration of the ventricles and extra-axial CSF spaces are normal. There is no acute or chronic infarction. The brain parenchyma is normal. Skull: The visualized skull base, calvarium and extracranial soft tissues are normal. Sinuses/Orbits: No fluid levels or advanced mucosal thickening of the visualized paranasal sinuses. No mastoid or middle ear effusion. The orbits are normal. CTA NECK FINDINGS SKELETON: There is no bony spinal canal stenosis. No lytic or blastic lesion. OTHER NECK: Normal pharynx, larynx and major salivary glands. No cervical lymphadenopathy. Unremarkable thyroid gland. UPPER CHEST: No pneumothorax or pleural effusion. No nodules or masses. AORTIC ARCH: There is no calcific atherosclerosis of the aortic arch. There is no aneurysm, dissection or hemodynamically significant stenosis of the visualized ascending aorta and aortic  arch. Conventional 3 vessel aortic branching pattern. The visualized proximal subclavian arteries are widely patent. RIGHT CAROTID SYSTEM: --Common carotid artery: Widely patent origin without common carotid artery dissection or aneurysm. --Internal carotid artery: Normal without aneurysm, dissection or stenosis. --External carotid artery: No acute abnormality. LEFT CAROTID SYSTEM: --Common carotid artery: Widely patent origin without common carotid artery dissection or aneurysm. --Internal carotid artery: Normal without aneurysm, dissection or  stenosis. --External carotid artery: No acute abnormality. VERTEBRAL ARTERIES: Left dominant configuration. Both origins are normal. No dissection, occlusion or flow-limiting stenosis to the vertebrobasilar confluence. CTA HEAD FINDINGS ANTERIOR CIRCULATION: --Intracranial internal carotid arteries: Normal. --Anterior cerebral arteries: Normal. Both A1 segments are present. Patent anterior communicating artery. --Middle cerebral arteries: Normal. --Posterior communicating arteries: Present bilaterally. POSTERIOR CIRCULATION: --Basilar artery: Normal. --Posterior cerebral arteries: Normal. --Superior cerebellar arteries: Normal. --Inferior cerebellar arteries: Normal anterior and posterior inferior cerebellar arteries. VENOUS SINUSES: As permitted by contrast timing, patent. ANATOMIC VARIANTS: None DELAYED PHASE: No parenchymal contrast enhancement. Review of the MIP images confirms the above findings. IMPRESSION: Normal CTA of the head and neck. Electronically Signed   By: Ulyses Jarred M.D.   On: 03/15/2018 01:30   Mr Brain Wo Contrast  Result Date: 03/14/2018 CLINICAL DATA:  Right-sided face and neck pain.  Possible numbness. EXAM: MRI HEAD WITHOUT CONTRAST TECHNIQUE: Multiplanar, multiecho pulse sequences of the brain and surrounding structures were obtained without intravenous contrast. COMPARISON:  Brain MRI 11/05/2017 FINDINGS: BRAIN: There is no acute infarct, acute hemorrhage, hydrocephalus or extra-axial collection. The midline structures are normal. No midline shift or other mass effect. There are no old infarcts. The white matter signal is normal for the patient's age. The cerebral and cerebellar volume are age-appropriate. Susceptibility-sensitive sequences show no chronic microhemorrhage or superficial siderosis. VASCULAR: Major intracranial arterial and venous sinus flow voids are normal. SKULL AND UPPER CERVICAL SPINE: Calvarial bone marrow signal is normal. There is no skull base mass.  Visualized upper cervical spine and soft tissues are normal. SINUSES/ORBITS: No fluid levels or advanced mucosal thickening. No mastoid or middle ear effusion. The orbits are normal. IMPRESSION: Normal brain. Electronically Signed   By: Ulyses Jarred M.D.   On: 03/14/2018 23:06   Prior provider spoke with neurologist Dr. Rory Percy- recommends CTA & MRI which have been ordered. Migraine cocktail administered. Signed out to me pending studies, if negative, likely complicated migraine & safe for discharge. .   MDM   Patient's emergency department work-up has been reviewed: Her labs are fairly unremarkable and appear at baseline.  Her creatinine of 1.10 and hgb of 9.3 are both similar to previous.  No significant electrolyte disturbance.  Her MRI and CT angio head/neck are both completely normal.  No evidence of ischemic/hemorrhagic CVA.  Per discussion with prior PA spoke with neurology likely complicated migraine. On re-evaluation @ 02:30 patient is feeling much better, blurred vision/tingling has resolved. Appears safe for discharge home with neuro follow up. I discussed results, treatment plan, need for follow-up, and return precautions with the patient. Provided opportunity for questions, patient confirmed understanding and is in agreement with plan.        Amaryllis Dyke, PA-C 03/15/18 0242    Fatima Blank, MD 03/15/18 [phone]

## 2018-03-14 NOTE — ED Triage Notes (Signed)
Patient presents with a sudden onset of right sided face and neck pain with possible associated facial numbness. Patient having difficulty describing. Patient has no pain or numbness anywhere else. No focal deficits. No vision changes.

## 2018-03-14 NOTE — ED Notes (Signed)
Pt returned from MRI at this time

## 2018-03-14 NOTE — ED Provider Notes (Signed)
[place] DEPT Provider Note   CSN: [id number] Arrival date & time: 03/14/18  1955     History   Chief Complaint Chief Complaint  Patient presents with  . Headache  . Neck Pain    HPI [name] is a 47 y.o. female.  HPI  Patient is a 47 year old female with a history of hypertension, depression, anxiety, PTSD presenting for right-sided facial pain radiating around to the back of her head and down her neck.  She reports that her symptoms began suddenly when she turned her head to the right to look at her son during dinner.  She reports that this was in the last 1.5 hours.  She reports that the pain initially began in the right frontal region, radiated posteriorly, and down her neck.  She reports associated right-sided blurred vision, but no diplopia or floaters.  She reports also some subjective decreased sensation in her right upper lip and right supraorbital region.  She denies any weakness or numbness elsewhere on her body.  Patient denies any vertiginous symptoms.  She reports that she is often dizzy at baseline, but reports no increase in the symptoms since her headache began.  Her son accompanies her who states that he was present on the symptoms began, and there was no facial drooping, or slurred speech.  Patient denies a history of headaches or migraines.  She denies a history of similar symptoms.  Patient denies any fever, chills, or neck stiffness.  She denies any recent upper respiratory infection with chest pain no shortness of breath, abdominal pain or vomiting.  She does report some mild nausea.  Patient denies taking any remedy for her symptoms prior to arrival.  Past Medical History:  Diagnosis Date  . Anxiety   . Arthritis    "joints ache all over" (10/15/2014)  . Bulging lumbar disc   . Chronic lower back pain   . DDD (degenerative disc disease), cervical   . Depression   . Drug-seeking behavior   . Headache    "weekly"  (10/15/2014)  . Hyperlipemia   . Hypertension   . PTSD (post-traumatic stress disorder)   . Skin cancer    "had them cut off my arms; don't know what kind"    Patient Active Problem List   Diagnosis Date Noted  . Acute gastroenteritis 11/19/2015  . Hypomagnesemia   . AKI (acute kidney injury) ([place])   . C. difficile colitis   . SIRS (systemic inflammatory response syndrome) ([place]) 01/11/2015  . Cellulitis 01/03/2015  . Tachycardia   . Dyspnea   . Opiate withdrawal ([place]) 12/18/2014  . Depression 12/17/2014  . Hypotension 12/16/2014  . Herpes simplex virus type 1 (HSV-1) dermatitis   . Sacral fracture ([place]) 12/12/2014  . Fall 12/12/2014  . Nausea and vomiting 12/12/2014  . Leukocytosis 12/12/2014  . Chronic lower back pain 12/12/2014  . Sacral fracture, closed ([place]) 12/12/2014  . Shingles outbreak 12/12/2014  . Nausea with vomiting   . Intractable abdominal pain 10/15/2014  . Abdominal pain 10/15/2014  . Hypokalemia 09/04/2014  . Sepsis ([place]) 09/01/2014  . Abdominal pain, generalized 09/01/2014  . PTSD (post-traumatic stress disorder) 09/01/2014  . Endometriosis 09/01/2014  . Nausea vomiting and diarrhea 09/01/2014  . Sinus tachycardia 09/01/2014  . Lactic acidosis 09/01/2014  . Severe sepsis ([place]) 09/01/2014    Past Surgical History:  Procedure Laterality Date  . ABLATION ON ENDOMETRIOSIS  2008  . HEMORRHOID SURGERY  ~ 2002     OB  History   None      Home Medications    Prior to Admission medications   Medication Sig Start Date End Date Taking? Authorizing Provider  FLUoxetine (PROZAC) 40 MG capsule Take 40 mg by mouth daily.  06/26/17  Yes [provider]  hydrochlorothiazide (HYDRODIURIL) 25 MG tablet Take 25 mg by mouth daily.  06/26/17  Yes [provider]  ibuprofen (ADVIL,MOTRIN) 200 MG tablet Take 400 mg by mouth every 6 (six) hours as needed for moderate pain.   Yes [provider]  lamoTRIgine (LAMICTAL) 150 MG tablet Take 150  mg by mouth 2 (two) times daily.   Yes [provider]  lisinopril (PRINIVIL,ZESTRIL) 20 MG tablet Take 10 mg by mouth daily.    Yes [provider]  omeprazole (PRILOSEC) 20 MG capsule Take 1 capsule (20 mg total) by mouth daily. 03/13/15  Yes Marella Chimes, PA-C  promethazine (PHENERGAN) 25 MG tablet Take 1 tablet (25 mg total) by mouth every 8 (eight) hours as needed for nausea or vomiting. 12/18/16  Yes Menshew, Dannielle Karvonen, PA-C  risperiDONE (RISPERDAL) 0.5 MG tablet Take 0.5 mg by mouth 2 (two) times daily. 03/03/18  Yes [provider]  tiZANidine (ZANAFLEX) 4 MG tablet Take 4 mg by mouth every 8 (eight) hours as needed for muscle spasms.   Yes [provider]  traZODone (DESYREL) 100 MG tablet Take 150 mg by mouth at bedtime.  06/03/17  Yes [provider]  dicyclomine (BENTYL) 20 MG tablet Take 1 tablet (20 mg total) by mouth 3 (three) times daily before meals. Patient not taking: Reported on 03/14/2018 11/27/17   Orpah Greek, MD  ondansetron (ZOFRAN) 4 MG tablet Take 1 tablet (4 mg total) by mouth every 6 (six) hours. Patient not taking: Reported on 03/14/2018 11/27/17   Orpah Greek, MD    Family History No family history on file.  Social History Social History   Tobacco Use  . Smoking status: Never Smoker  . Smokeless tobacco: Never Used  Substance Use Topics  . Alcohol use: Yes    Comment: 10/15/2014 "I've drank before; nothing regular; don't drink now cause of RX I'm on"  . Drug use: No     Allergies   Lactose intolerance (gi)   Review of Systems Review of Systems  Constitutional: Negative for chills and fever.  HENT: Negative for congestion, sinus pressure and sore throat.   Eyes: Positive for photophobia and visual disturbance.  Respiratory: Negative for cough, chest tightness and shortness of breath.   Cardiovascular: Negative for chest pain, palpitations and leg swelling.  Gastrointestinal:  Positive for nausea. Negative for abdominal pain and vomiting.  Genitourinary: Negative for dysuria and flank pain.  Musculoskeletal: Negative for back pain and myalgias.  Skin: Negative for rash.  Neurological: Positive for numbness and headaches. Negative for dizziness, syncope, weakness and light-headedness.     Physical Exam Updated Vital Signs BP (!) 156/94 (BP Location: Right Arm)   Pulse 86   Temp 97.8 F (36.6 C) (Oral)   Resp 16   SpO2 99%   Physical Exam  Constitutional: She appears well-developed and well-nourished. No distress.  HENT:  Head: Normocephalic and atraumatic.  Mouth/Throat: Oropharynx is clear and moist.  Eyes: Pupils are equal, round, and reactive to light. Conjunctivae and EOM are normal.  Neck: Normal range of motion. Neck supple.  Cardiovascular: Normal rate, regular rhythm, S1 normal and S2 normal.  No murmur heard. Pulmonary/Chest: Effort normal and  breath sounds normal. She has no wheezes. She has no rales.  Abdominal: Soft. She exhibits no distension. There is no tenderness. There is no guarding.  Musculoskeletal: Normal range of motion. She exhibits no edema or deformity.  Lymphadenopathy:    She has no cervical adenopathy.  Neurological: She is alert. GCS eye subscore is 4. GCS verbal subscore is 5. GCS motor subscore is 6.  Mental Status:  Alert, oriented, thought content appropriate, able to give a coherent history. Speech fluent without evidence of aphasia. Able to follow 2 step commands without difficulty.  Cranial Nerves:  II:  Peripheral visual fields grossly normal, pupils equal, round, reactive to light III,IV, VI: ptosis not present, extra-ocular motions intact bilaterally  V,VII: smile symmetric,  VIII: hearing grossly normal to voice  X: uvula elevates symmetrically  XI: bilateral shoulder shrug symmetric and strong XII: midline tongue extension without fassiculations Motor:  Normal tone. 5/5 in upper and lower extremities  bilaterally including strong and equal grip strength and dorsiflexion/plantar flexion Sensory: Pinprick and light touch normal in all extremities.  Deep Tendon Reflexes: 2+ and symmetric in the biceps and patella. No clonus Cerebellar: normal finger-to-nose with bilateral upper extremities Gait: Slight shuffling gait. Stance: No pronator drift and good coordination, strength, and position sense with tapping of bilateral arms (performed in sitting position). CV: distal pulses palpable throughout   Skin: Skin is warm and dry. No rash noted. No erythema.  Psychiatric: She has a normal mood and affect. Her behavior is normal. Judgment and thought content normal.  Nursing note and vitals reviewed.    ED Treatments / Results  Labs (all labs ordered are listed, but only abnormal results are displayed) Labs Reviewed  BASIC METABOLIC PANEL  CBC WITH DIFFERENTIAL/PLATELET  I-STAT BETA HCG BLOOD, ED ([facility], [facility], [facility] ONLY)    EKG None  Radiology No results found.  Procedures Procedures (including critical care time)  Medications Ordered in ED Medications  sodium chloride 0.9 % bolus 1,000 mL (has no administration in time range)  acetaminophen (TYLENOL) tablet 1,000 mg (has no administration in time range)  lidocaine (LIDODERM) 5 % 1 patch (has no administration in time range)  ondansetron (ZOFRAN) tablet 4 mg (has no administration in time range)  diazepam (VALIUM) tablet 5 mg (has no administration in time range)     Initial Impression / Assessment and Plan / ED Course  I have reviewed the triage vital signs and the nursing notes.  Pertinent labs & imaging results that were available during my care of the patient were reviewed by me and considered in my medical decision making (see chart for details).     Patient nontoxic-appearing, alert and oriented x3, but reporting subjective visual changes and decreased sensation in portions of CN VII distribution.  Differential diagnosis  includes vertebral artery dissection, SAH, complicated migraine, torticollis, trigeminal neuralgia.   Case was discussed with Dr. Rory Percy of neurology who recommended CTA of head and neck to rule out dissection, and MRI of brain without contrast.  In the meantime, we will treat patient with migraine cocktail.  Did not prescribe Reglan given patient's current antipsychotics on medication list.  Lab work and imaging are pending.  Care signed out to [facility], PA-C at 10:20 PM to follow imaging and labs.   This is a supervised visit with Dr. Gerald Stabs Tegeler. Evaluation, management discussed with this attending physician.  Final Clinical Impressions(s) / ED Diagnoses   Final diagnoses:  Right sided facial pain  Right-sided headache  Neck and shoulder pain  Facial numbness  Visual disturbance  Elevated blood pressure reading with diagnosis of hypertension    ED Discharge Orders    None       Tamala Julian 03/14/18 2221    Tegeler, Gwenyth Allegra, MD 03/15/18 [phone]

## 2018-03-14 NOTE — ED Notes (Signed)
Patient transported to MRI 

## 2018-03-14 NOTE — ED Notes (Signed)
IV attempted unsuccessful at this time. Charge RN asked to attempt

## 2018-03-15 ENCOUNTER — Emergency Department (HOSPITAL_COMMUNITY): Payer: Medicare Other

## 2018-03-15 ENCOUNTER — Encounter (HOSPITAL_COMMUNITY): Payer: Self-pay

## 2018-03-15 DIAGNOSIS — M542 Cervicalgia: Secondary | ICD-10-CM | POA: Diagnosis not present

## 2018-03-15 DIAGNOSIS — R202 Paresthesia of skin: Secondary | ICD-10-CM | POA: Diagnosis not present

## 2018-03-15 DIAGNOSIS — R51 Headache: Secondary | ICD-10-CM | POA: Diagnosis not present

## 2018-03-15 LAB — CBC WITH DIFFERENTIAL/PLATELET
ABS IMMATURE GRANULOCYTES: 0.06 10*3/uL (ref 0.00–0.07)
BASOS ABS: 0.1 10*3/uL (ref 0.0–0.1)
BASOS PCT: 1 %
Eosinophils Absolute: 0.3 10*3/uL (ref 0.0–0.5)
Eosinophils Relative: 3 %
HCT: 31.6 % — ABNORMAL LOW (ref 36.0–46.0)
Hemoglobin: 9.3 g/dL — ABNORMAL LOW (ref 12.0–15.0)
Immature Granulocytes: 1 %
Lymphocytes Relative: 21 %
Lymphs Abs: 2 10*3/uL (ref 0.7–4.0)
MCH: 23.7 pg — ABNORMAL LOW (ref 26.0–34.0)
MCHC: 29.4 g/dL — ABNORMAL LOW (ref 30.0–36.0)
MCV: 80.4 fL (ref 80.0–100.0)
Monocytes Absolute: 0.6 10*3/uL (ref 0.1–1.0)
Monocytes Relative: 6 %
NEUTROS ABS: 6.6 10*3/uL (ref 1.7–7.7)
NRBC: 0 % (ref 0.0–0.2)
Neutrophils Relative %: 68 %
PLATELETS: 376 10*3/uL (ref 150–400)
RBC: 3.93 MIL/uL (ref 3.87–5.11)
RDW: 17.1 % — ABNORMAL HIGH (ref 11.5–15.5)
WBC: 9.6 10*3/uL (ref 4.0–10.5)

## 2018-03-15 LAB — I-STAT BETA HCG BLOOD, ED (MC, WL, AP ONLY)

## 2018-03-15 LAB — BASIC METABOLIC PANEL
Anion gap: 11 (ref 5–15)
BUN: 16 mg/dL (ref 6–20)
CO2: 26 mmol/L (ref 22–32)
Calcium: 9 mg/dL (ref 8.9–10.3)
Chloride: 100 mmol/L (ref 98–111)
Creatinine, Ser: 1.1 mg/dL — ABNORMAL HIGH (ref 0.44–1.00)
GFR calc Af Amer: >60 mL/min (ref >60)
GFR, EST NON AFRICAN AMERICAN: 59 mL/min — AB (ref >60)
Glucose, Bld: 92 mg/dL (ref 70–99)
Potassium: 3.6 mmol/L (ref 3.5–5.1)
SODIUM: 137 mmol/L (ref 135–145)

## 2018-03-15 MED ORDER — SODIUM CHLORIDE (PF) 0.9 % IJ SOLN
INTRAMUSCULAR | Status: AC
  Filled 2018-03-15: qty 50

## 2018-03-15 MED ORDER — IOPAMIDOL (ISOVUE-370) INJECTION 76%
INTRAVENOUS | Status: AC
  Filled 2018-03-15: qty 100

## 2018-03-15 MED ORDER — IOPAMIDOL (ISOVUE-370) INJECTION 76%
100.0000 mL | Freq: Once | INTRAVENOUS | Status: AC | PRN
  Administered 2018-03-15: 100 mL via INTRAVENOUS

## 2018-03-15 NOTE — Discharge Instructions (Addendum)
You were seen in the emergency department today for facial pain with neurologic concerns.  Your work-up in the emergency department has been overall reassuring.  Your hemoglobin was somewhat low at 9.3, this is similar to prior hemoglobins on record for your labs, please have this rechecked by primary care provider.  Your creatinine, measure of kidney function somewhat elevated at 1.10, this is also consistent with prior labs, please also have this rechecked by her primary care provider.  The MRI of your brain and the CT scan of your head and neck were both completely normal.  This is reassuring.  We suspect that your symptoms may have been due to a complex migraine.  We would like you to follow-up with neurology within 1 week from reevaluation and further discussion.  Would also like you to follow-up with your primary care provider within 1 week for recheck of your blood pressure was elevated in the ER.  Return to the ER for new or worsening symptoms or any other concerns.

## 2018-03-15 NOTE — ED Notes (Signed)
Patient transported to CT 

## 2018-10-30 DIAGNOSIS — F331 Major depressive disorder, recurrent, moderate: Secondary | ICD-10-CM | POA: Diagnosis not present

## 2018-10-30 DIAGNOSIS — M549 Dorsalgia, unspecified: Secondary | ICD-10-CM | POA: Diagnosis not present

## 2018-10-30 DIAGNOSIS — E038 Other specified hypothyroidism: Secondary | ICD-10-CM | POA: Diagnosis not present

## 2018-10-30 DIAGNOSIS — E119 Type 2 diabetes mellitus without complications: Secondary | ICD-10-CM | POA: Diagnosis not present

## 2018-10-30 DIAGNOSIS — E039 Hypothyroidism, unspecified: Secondary | ICD-10-CM | POA: Diagnosis not present

## 2018-10-30 DIAGNOSIS — Z0001 Encounter for general adult medical examination with abnormal findings: Secondary | ICD-10-CM | POA: Diagnosis not present

## 2018-10-30 DIAGNOSIS — E7800 Pure hypercholesterolemia, unspecified: Secondary | ICD-10-CM | POA: Diagnosis not present

## 2018-10-30 DIAGNOSIS — F319 Bipolar disorder, unspecified: Secondary | ICD-10-CM | POA: Diagnosis not present

## 2018-10-30 DIAGNOSIS — E0789 Other specified disorders of thyroid: Secondary | ICD-10-CM | POA: Diagnosis not present

## 2018-11-28 ENCOUNTER — Emergency Department: Payer: Medicare Other

## 2018-11-28 ENCOUNTER — Observation Stay
Admission: EM | Admit: 2018-11-28 | Discharge: 2018-11-28 | Disposition: A | Discharge status: Home / Self Care | Payer: Medicare Other | Attending: Internal Medicine | Admitting: Internal Medicine

## 2018-11-28 ENCOUNTER — Other Ambulatory Visit: Payer: Self-pay

## 2018-11-28 DIAGNOSIS — Z791 Long term (current) use of non-steroidal anti-inflammatories (NSAID): Secondary | ICD-10-CM | POA: Insufficient documentation

## 2018-11-28 DIAGNOSIS — R300 Dysuria: Secondary | ICD-10-CM | POA: Insufficient documentation

## 2018-11-28 DIAGNOSIS — Z7901 Long term (current) use of anticoagulants: Secondary | ICD-10-CM | POA: Insufficient documentation

## 2018-11-28 DIAGNOSIS — F319 Bipolar disorder, unspecified: Secondary | ICD-10-CM | POA: Diagnosis present

## 2018-11-28 DIAGNOSIS — M1990 Unspecified osteoarthritis, unspecified site: Secondary | ICD-10-CM | POA: Insufficient documentation

## 2018-11-28 DIAGNOSIS — G8929 Other chronic pain: Secondary | ICD-10-CM | POA: Diagnosis not present

## 2018-11-28 DIAGNOSIS — Z03818 Encounter for observation for suspected exposure to other biological agents ruled out: Secondary | ICD-10-CM | POA: Diagnosis not present

## 2018-11-28 DIAGNOSIS — K449 Diaphragmatic hernia without obstruction or gangrene: Secondary | ICD-10-CM | POA: Diagnosis not present

## 2018-11-28 DIAGNOSIS — E785 Hyperlipidemia, unspecified: Secondary | ICD-10-CM | POA: Insufficient documentation

## 2018-11-28 DIAGNOSIS — R1084 Generalized abdominal pain: Secondary | ICD-10-CM | POA: Insufficient documentation

## 2018-11-28 DIAGNOSIS — M62838 Other muscle spasm: Secondary | ICD-10-CM | POA: Insufficient documentation

## 2018-11-28 DIAGNOSIS — D72829 Elevated white blood cell count, unspecified: Secondary | ICD-10-CM | POA: Diagnosis not present

## 2018-11-28 DIAGNOSIS — G4700 Insomnia, unspecified: Secondary | ICD-10-CM | POA: Insufficient documentation

## 2018-11-28 DIAGNOSIS — Z79899 Other long term (current) drug therapy: Secondary | ICD-10-CM | POA: Insufficient documentation

## 2018-11-28 DIAGNOSIS — M549 Dorsalgia, unspecified: Secondary | ICD-10-CM | POA: Diagnosis not present

## 2018-11-28 DIAGNOSIS — J9811 Atelectasis: Secondary | ICD-10-CM | POA: Diagnosis not present

## 2018-11-28 DIAGNOSIS — M545 Low back pain: Secondary | ICD-10-CM | POA: Diagnosis not present

## 2018-11-28 DIAGNOSIS — Z20828 Contact with and (suspected) exposure to other viral communicable diseases: Secondary | ICD-10-CM | POA: Insufficient documentation

## 2018-11-28 DIAGNOSIS — I10 Essential (primary) hypertension: Secondary | ICD-10-CM | POA: Insufficient documentation

## 2018-11-28 DIAGNOSIS — F3131 Bipolar disorder, current episode depressed, mild: Secondary | ICD-10-CM | POA: Diagnosis not present

## 2018-11-28 DIAGNOSIS — Z85828 Personal history of other malignant neoplasm of skin: Secondary | ICD-10-CM | POA: Insufficient documentation

## 2018-11-28 DIAGNOSIS — F329 Major depressive disorder, single episode, unspecified: Secondary | ICD-10-CM | POA: Diagnosis not present

## 2018-11-28 DIAGNOSIS — R112 Nausea with vomiting, unspecified: Secondary | ICD-10-CM | POA: Diagnosis not present

## 2018-11-28 LAB — COMPREHENSIVE METABOLIC PANEL
ALT: 15 U/L (ref 0–44)
AST: 27 U/L (ref 15–41)
Albumin: 4.2 g/dL (ref 3.5–5.0)
Alkaline Phosphatase: 100 U/L (ref 38–126)
Anion gap: 15 (ref 5–15)
BUN: 8 mg/dL (ref 6–20)
CO2: 21 mmol/L — ABNORMAL LOW (ref 22–32)
Calcium: 9.1 mg/dL (ref 8.9–10.3)
Chloride: 104 mmol/L (ref 98–111)
Creatinine, Ser: 0.96 mg/dL (ref 0.44–1.00)
GFR calc Af Amer: >60 mL/min (ref >60)
GFR calc non Af Amer: >60 mL/min (ref >60)
Glucose, Bld: 132 mg/dL — ABNORMAL HIGH (ref 70–99)
Potassium: 3.7 mmol/L (ref 3.5–5.1)
Sodium: 140 mmol/L (ref 135–145)
Total Bilirubin: 0.4 mg/dL (ref 0.3–1.2)
Total Protein: 8.5 g/dL — ABNORMAL HIGH (ref 6.5–8.1)

## 2018-11-28 LAB — TSH: TSH: 2.211 u[IU]/mL (ref 0.350–4.500)

## 2018-11-28 LAB — URINALYSIS, COMPLETE (UACMP) WITH MICROSCOPIC
Bacteria, UA: NONE SEEN
Bilirubin Urine: NEGATIVE
Glucose, UA: NEGATIVE mg/dL
Hgb urine dipstick: NEGATIVE
Ketones, ur: 5 mg/dL — AB
Leukocytes,Ua: NEGATIVE
Nitrite: NEGATIVE
Protein, ur: NEGATIVE mg/dL
Specific Gravity, Urine: 1.014 (ref 1.005–1.030)
pH: 5 (ref 5.0–8.0)

## 2018-11-28 LAB — URINE DRUG SCREEN, QUALITATIVE (ARMC ONLY)
Amphetamines, Ur Screen: NOT DETECTED
Barbiturates, Ur Screen: NOT DETECTED
Benzodiazepine, Ur Scrn: NOT DETECTED
Cannabinoid 50 Ng, Ur ~~LOC~~: NOT DETECTED
Cocaine Metabolite,Ur ~~LOC~~: NOT DETECTED
MDMA (Ecstasy)Ur Screen: NOT DETECTED
Methadone Scn, Ur: NOT DETECTED
Opiate, Ur Screen: NOT DETECTED
Phencyclidine (PCP) Ur S: NOT DETECTED
Tricyclic, Ur Screen: NOT DETECTED

## 2018-11-28 LAB — SARS CORONAVIRUS 2 BY RT PCR (HOSPITAL ORDER, PERFORMED IN ~~LOC~~ HOSPITAL LAB): SARS Coronavirus 2: NEGATIVE

## 2018-11-28 LAB — CBC
HCT: 32.8 % — ABNORMAL LOW (ref 36.0–46.0)
Hemoglobin: 10.3 g/dL — ABNORMAL LOW (ref 12.0–15.0)
MCH: 25.2 pg — ABNORMAL LOW (ref 26.0–34.0)
MCHC: 31.4 g/dL (ref 30.0–36.0)
MCV: 80.4 fL (ref 80.0–100.0)
Platelets: 498 10*3/uL — ABNORMAL HIGH (ref 150–400)
RBC: 4.08 MIL/uL (ref 3.87–5.11)
RDW: 15.5 % (ref 11.5–15.5)
WBC: 15.8 10*3/uL — ABNORMAL HIGH (ref 4.0–10.5)
nRBC: 0 % (ref 0.0–0.2)

## 2018-11-28 LAB — POCT PREGNANCY, URINE: Preg Test, Ur: NEGATIVE

## 2018-11-28 LAB — PROCALCITONIN: Procalcitonin: <0.1 ng/mL

## 2018-11-28 LAB — LIPASE, BLOOD: Lipase: 28 U/L (ref 11–51)

## 2018-11-28 MED ORDER — SODIUM CHLORIDE 0.9% FLUSH: 3.0000 mL | Freq: Once | INTRAVENOUS | Status: DC

## 2018-11-28 MED ORDER — KETOROLAC TROMETHAMINE 15 MG/ML IJ SOLN: 15.0000 mg | Freq: Four times a day (QID) | INTRAMUSCULAR | Status: DC

## 2018-11-28 MED ORDER — SODIUM CHLORIDE 0.9 % IV SOLN
INTRAVENOUS | Status: DC
  Administered 2018-11-28: 09:00:00 via INTRAVENOUS

## 2018-11-28 MED ORDER — LABETALOL HCL 5 MG/ML IV SOLN: 5.0000 mg | INTRAVENOUS | Status: DC | PRN

## 2018-11-28 MED ORDER — HYDROCHLOROTHIAZIDE 25 MG PO TABS
25.0000 mg | ORAL_TABLET | Freq: Every day | ORAL | Status: DC
  Administered 2018-11-28: 10:00:00 25 mg via ORAL
  Filled 2018-11-28: qty 1

## 2018-11-28 MED ORDER — ONDANSETRON HCL 4 MG/2ML IJ SOLN
4.0000 mg | Freq: Four times a day (QID) | INTRAMUSCULAR | Status: DC | PRN
  Administered 2018-11-28: 4 mg via INTRAVENOUS
  Filled 2018-11-28: qty 2

## 2018-11-28 MED ORDER — RISPERIDONE 0.5 MG PO TABS
0.5000 mg | ORAL_TABLET | Freq: Two times a day (BID) | ORAL | Status: DC
  Filled 2018-11-28 (×2): qty 1

## 2018-11-28 MED ORDER — PROCHLORPERAZINE EDISYLATE 10 MG/2ML IJ SOLN
10.0000 mg | Freq: Four times a day (QID) | INTRAMUSCULAR | Status: DC | PRN
  Filled 2018-11-28: qty 2

## 2018-11-28 MED ORDER — LABETALOL HCL 5 MG/ML IV SOLN
10.0000 mg | INTRAVENOUS | Status: AC
  Administered 2018-11-28: 08:00:00 10 mg via INTRAVENOUS
  Filled 2018-11-28: qty 4

## 2018-11-28 MED ORDER — PROMETHAZINE HCL 25 MG/ML IJ SOLN
12.5000 mg | Freq: Four times a day (QID) | INTRAMUSCULAR | Status: DC | PRN
  Administered 2018-11-28: 12.5 mg via INTRAVENOUS

## 2018-11-28 MED ORDER — ONDANSETRON HCL 4 MG PO TABS: 4.0000 mg | ORAL_TABLET | Freq: Four times a day (QID) | ORAL | Status: DC | PRN

## 2018-11-28 MED ORDER — LAMOTRIGINE 25 MG PO TABS: 150.0000 mg | ORAL_TABLET | Freq: Two times a day (BID) | ORAL | Status: DC

## 2018-11-28 MED ORDER — SUCRALFATE 1 G PO TABS
1.0000 g | ORAL_TABLET | Freq: Once | ORAL | Status: DC
  Filled 2018-11-28: qty 1

## 2018-11-28 MED ORDER — ACETAMINOPHEN 650 MG RE SUPP: 650.0000 mg | Freq: Four times a day (QID) | RECTAL | Status: DC | PRN

## 2018-11-28 MED ORDER — FENTANYL CITRATE (PF) 100 MCG/2ML IJ SOLN
12.5000 ug | INTRAMUSCULAR | Status: AC
  Administered 2018-11-28: 12.5 ug via INTRAVENOUS
  Filled 2018-11-28: qty 2

## 2018-11-28 MED ORDER — IOHEXOL 300 MG/ML  SOLN
100.0000 mL | Freq: Once | INTRAMUSCULAR | Status: AC | PRN
  Administered 2018-11-28: 100 mL via INTRAVENOUS

## 2018-11-28 MED ORDER — SODIUM CHLORIDE 0.9 % IV BOLUS
1000.0000 mL | Freq: Once | INTRAVENOUS | Status: AC
  Administered 2018-11-28: 1000 mL via INTRAVENOUS

## 2018-11-28 MED ORDER — SODIUM CHLORIDE 0.9 % IV SOLN
Freq: Once | INTRAVENOUS | Status: AC
  Administered 2018-11-28: 06:00:00 via INTRAVENOUS

## 2018-11-28 MED ORDER — ACETAMINOPHEN 325 MG PO TABS
650.0000 mg | ORAL_TABLET | Freq: Four times a day (QID) | ORAL | Status: DC | PRN
  Administered 2018-11-28: 650 mg via ORAL
  Filled 2018-11-28: qty 2

## 2018-11-28 MED ORDER — HALOPERIDOL LACTATE 5 MG/ML IJ SOLN
5.0000 mg | Freq: Once | INTRAMUSCULAR | Status: AC
  Administered 2018-11-28: 5 mg via INTRAMUSCULAR
  Filled 2018-11-28: qty 1

## 2018-11-28 MED ORDER — ENOXAPARIN SODIUM 40 MG/0.4ML ~~LOC~~ SOLN
40.0000 mg | SUBCUTANEOUS | Status: DC
  Administered 2018-11-28: 40 mg via SUBCUTANEOUS
  Filled 2018-11-28: qty 0.4

## 2018-11-28 MED ORDER — TRAZODONE HCL 50 MG PO TABS: 150.0000 mg | ORAL_TABLET | Freq: Every day | ORAL | Status: DC

## 2018-11-28 MED ORDER — FLUOXETINE HCL 20 MG PO CAPS
40.0000 mg | ORAL_CAPSULE | Freq: Every day | ORAL | Status: DC
  Filled 2018-11-28: qty 2

## 2018-11-28 MED ORDER — ONDANSETRON 4 MG PO TBDP: 4.0000 mg | ORAL_TABLET | Freq: Three times a day (TID) | ORAL | 0 refills | Status: DC | PRN

## 2018-11-28 MED ORDER — LORAZEPAM 2 MG/ML IJ SOLN
1.0000 mg | Freq: Once | INTRAMUSCULAR | Status: AC
  Administered 2018-11-28: 1 mg via INTRAVENOUS
  Filled 2018-11-28: qty 1

## 2018-11-28 MED ORDER — PANTOPRAZOLE SODIUM 40 MG IV SOLR
40.0000 mg | Freq: Once | INTRAVENOUS | Status: AC
  Administered 2018-11-28: 40 mg via INTRAVENOUS
  Filled 2018-11-28: qty 40

## 2018-11-28 MED ORDER — ONDANSETRON HCL 4 MG/2ML IJ SOLN
4.0000 mg | Freq: Once | INTRAMUSCULAR | Status: AC | PRN
  Administered 2018-11-28: 4 mg via INTRAVENOUS
  Filled 2018-11-28: qty 2

## 2018-11-28 MED ORDER — LISINOPRIL 10 MG PO TABS
10.0000 mg | ORAL_TABLET | Freq: Every day | ORAL | Status: DC
  Administered 2018-11-28: 10:00:00 10 mg via ORAL
  Filled 2018-11-28: qty 1

## 2018-11-28 MED ORDER — PANTOPRAZOLE SODIUM 40 MG PO TBEC
40.0000 mg | DELAYED_RELEASE_TABLET | Freq: Every day | ORAL | Status: DC
  Administered 2018-11-28: 40 mg via ORAL
  Filled 2018-11-28: qty 1

## 2018-11-28 MED ORDER — DICYCLOMINE HCL 10 MG PO CAPS
10.0000 mg | ORAL_CAPSULE | Freq: Three times a day (TID) | ORAL | Status: DC
  Filled 2018-11-28 (×3): qty 1

## 2018-11-28 MED ORDER — TIZANIDINE HCL 4 MG PO TABS
8.0000 mg | ORAL_TABLET | Freq: Three times a day (TID) | ORAL | Status: DC
  Filled 2018-11-28 (×3): qty 2

## 2018-11-28 MED ORDER — DOCUSATE SODIUM 100 MG PO CAPS: 100.0000 mg | ORAL_CAPSULE | Freq: Two times a day (BID) | ORAL | Status: DC

## 2018-11-28 MED ORDER — ACETAMINOPHEN 500 MG PO TABS
1000.0000 mg | ORAL_TABLET | Freq: Once | ORAL | Status: DC
  Filled 2018-11-28: qty 2

## 2018-11-28 NOTE — ED Notes (Signed)
Patient unable to hold still for CT A&P with IV contrast due to coughing and vomiting. Sent back to ED for more nausea meds.

## 2018-11-28 NOTE — ED Notes (Signed)
Dr. Marcille Blanco in room to assess patient.  Will continue to monitor.

## 2018-11-28 NOTE — ED Triage Notes (Signed)
Patient c/o emesis and generalized body aches.

## 2018-11-28 NOTE — Consult Note (Signed)
[facility] Consult   Reason for Consult:  Questionable somatic symptoms Referring Physician:  Dr Marcille Blanco Patient Identification: [name] MRN:  [id number] Principal Diagnosis: Bipolar affective disorder Diagnosis:  Active Problems:   Bipolar affective disorder ([place])   Intractable nausea and vomiting   Total Time spent with patient: 1 hour  Subjective:   [name] is a 48 y.o. female patient admitted with nausea and vomiting, complains of her stomach hurting and "everything out of whack".  Denies suicidal/homicidal ideations, hallucinations, or substance abuse.    HPI:  48 yo female with a history of bipolar d/o and chronic abdominal and back pain.  She is currently seeing a mental health provider in [place], [place] [place] for her medication management.  Denies sleep or appetite issues until the past 24 hours when she developed the nausea and vomiting.  No suicidal/homicidal ideations, hallucinations, or substance abuse.  Feels her medication regiment is effective except the past twenty four hours as she has been physically sick and unable to tolerate medications, liquids, or food.  Lives with her husband and children.  Permission obtained to call her husband, called the number listed on her Facesheet but no answer or voicemail.  Denies needing any mental health assistance at this time, psychiatrically stable at this time.  Past Psychiatric History: bipolar d/o  Risk to Self:  none Risk to Others:  none Prior Inpatient Therapy:  yes Prior Outpatient Therapy:  yes  Past Medical History:  Past Medical History:  Diagnosis Date  . Anxiety   . Arthritis    "joints ache all over" (10/15/2014)  . Bulging lumbar disc   . Chronic lower back pain   . DDD (degenerative disc disease), cervical   . Depression   . Drug-seeking behavior   . Headache    "weekly" (10/15/2014)  . Hyperlipemia   . Hypertension   . PTSD (post-traumatic stress disorder)   . Skin  cancer    "had them cut off my arms; don't know what kind"    Past Surgical History:  Procedure Laterality Date  . ABLATION ON ENDOMETRIOSIS  2008  . HEMORRHOID SURGERY  ~ 2002   Family History: No family history on file. Family Psychiatric  History: none Social History:  Social History   Substance and Sexual Activity  Alcohol Use Yes   Comment: 10/15/2014 "I've drank before; nothing regular; don't drink now cause of RX I'm on"     Social History   Substance and Sexual Activity  Drug Use No    Social History   Socioeconomic History  . Marital status: Married    Spouse name: Not on file  . Number of children: Not on file  . Years of education: Not on file  . Highest education level: Not on file  Occupational History  . Not on file  Social Needs  . Financial resource strain: Not on file  . Food insecurity    Worry: Not on file    Inability: Not on file  . Transportation needs    Medical: Not on file    Non-medical: Not on file  Tobacco Use  . Smoking status: Never Smoker  . Smokeless tobacco: Never Used  Substance and Sexual Activity  . Alcohol use: Yes    Comment: 10/15/2014 "I've drank before; nothing regular; don't drink now cause of RX I'm on"  . Drug use: No  . Sexual activity: Not Currently  Lifestyle  . Physical activity    Days per week:  Not on file    Minutes per session: Not on file  . Stress: Not on file  Relationships  . Social Herbalist on phone: Not on file    Gets together: Not on file    Attends religious service: Not on file    Active member of club or organization: Not on file    Attends meetings of clubs or organizations: Not on file    Relationship status: Not on file  Other Topics Concern  . Not on file  Social History Narrative  . Not on file   Additional Social History:    Allergies:   Allergies  Allergen Reactions  . Lactose Intolerance (Gi) Nausea Only    Labs:  Results for orders placed or performed during  the hospital encounter of 11/28/18 (from the past 48 hour(s))  Lipase, blood     Status: None   Collection Time: 11/28/18  1:35 AM  Result Value Ref Range   Lipase 28 11 - 51 U/L    Comment: Performed at [facility], [address] [place], [place] [address]  Comprehensive metabolic panel     Status: Abnormal   Collection Time: 11/28/18  1:35 AM  Result Value Ref Range   Sodium 140 135 - 145 mmol/L   Potassium 3.7 3.5 - 5.1 mmol/L   Chloride 104 98 - 111 mmol/L   CO2 21 (L) 22 - 32 mmol/L   Glucose, Bld 132 (H) 70 - 99 mg/dL   BUN 8 6 - 20 mg/dL   Creatinine, Ser 0.96 0.44 - 1.00 mg/dL   Calcium 9.1 8.9 - 10.3 mg/dL   Total Protein 8.5 (H) 6.5 - 8.1 g/dL   Albumin 4.2 3.5 - 5.0 g/dL   AST 27 15 - 41 U/L   ALT 15 0 - 44 U/L   Alkaline Phosphatase 100 38 - 126 U/L   Total Bilirubin 0.4 0.3 - 1.2 mg/dL   GFR calc non Af Amer >60 >60 mL/min   GFR calc Af Amer >60 >60 mL/min   Anion gap 15 5 - 15    Comment: Performed at [facility], [address] [place], [place] [address]  CBC     Status: Abnormal   Collection Time: 11/28/18  1:35 AM  Result Value Ref Range   WBC 15.8 (H) 4.0 - 10.5 K/uL   RBC 4.08 3.87 - 5.11 MIL/uL   Hemoglobin 10.3 (L) 12.0 - 15.0 g/dL   HCT 32.8 (L) 36.0 - 46.0 %   MCV 80.4 80.0 - 100.0 fL   MCH 25.2 (L) 26.0 - 34.0 pg   MCHC 31.4 30.0 - 36.0 g/dL   RDW 15.5 11.5 - 15.5 %   Platelets 498 (H) 150 - 400 K/uL   nRBC 0.0 0.0 - 0.2 %    Comment: Performed at [facility], [address] [place], [place] [address]  TSH     Status: None   Collection Time: 11/28/18  1:35 AM  Result Value Ref Range   TSH 2.211 0.350 - 4.500 uIU/mL    Comment: Performed by a 3rd Generation assay with a functional sensitivity of <=0.01 uIU/mL. Performed at [facility], [address] [place], [place] [address]   SARS Coronavirus 2 [facility] order, Performed in [facility] hospital lab) Nasopharyngeal Nasopharyngeal Swab     Status:  None   Collection Time: 11/28/18  1:45 AM   Specimen: Nasopharyngeal Swab  Result Value Ref Range  SARS Coronavirus 2 NEGATIVE NEGATIVE    Comment: (NOTE) If result is NEGATIVE SARS-CoV-2 target nucleic acids are NOT DETECTED. The SARS-CoV-2 RNA is generally detectable in upper and lower  respiratory specimens during the acute phase of infection. The lowest  concentration of SARS-CoV-2 viral copies this assay can detect is 250  copies / mL. A negative result does not preclude SARS-CoV-2 infection  and should not be used as the sole basis for treatment or other  patient management decisions.  A negative result may occur with  improper specimen collection / handling, submission of specimen other  than nasopharyngeal swab, presence of viral mutation(s) within the  areas targeted by this assay, and inadequate number of viral copies  (<250 copies / mL). A negative result must be combined with clinical  observations, patient history, and epidemiological information. If result is POSITIVE SARS-CoV-2 target nucleic acids are DETECTED. The SARS-CoV-2 RNA is generally detectable in upper and lower  respiratory specimens dur ing the acute phase of infection.  Positive  results are indicative of active infection with SARS-CoV-2.  Clinical  correlation with patient history and other diagnostic information is  necessary to determine patient infection status.  Positive results do  not rule out bacterial infection or co-infection with other viruses. If result is PRESUMPTIVE POSTIVE SARS-CoV-2 nucleic acids MAY BE PRESENT.   A presumptive positive result was obtained on the submitted specimen  and confirmed on repeat testing.  While 2019 novel coronavirus  (SARS-CoV-2) nucleic acids may be present in the submitted sample  additional confirmatory testing may be necessary for epidemiological  and / or clinical management purposes  to differentiate between  SARS-CoV-2 and other Sarbecovirus currently  known to infect humans.  If clinically indicated additional testing with an alternate test  methodology [id number]) is advised. The SARS-CoV-2 RNA is generally  detectable in upper and lower respiratory sp ecimens during the acute  phase of infection. The expected result is Negative. Fact Sheet for Patients:  StrictlyIdeas.no Fact Sheet for Healthcare Providers: BankingDealers.co.za This test is not yet approved or cleared by the [place] FDA and has been authorized for detection and/or diagnosis of SARS-CoV-2 by FDA under an Emergency Use Authorization (EUA).  This EUA will remain in effect (meaning this test can be used) for the duration of the COVID-19 declaration under Section 564(b)(1) of the Act, 21 U.S.C. section 360bbb-3(b)(1), unless the authorization is terminated or revoked sooner. Performed at [facility], [address] [place], [place] [address]   Urinalysis, Complete w Microscopic     Status: Abnormal   Collection Time: 11/28/18  3:25 AM  Result Value Ref Range   Color, Urine YELLOW (A) YELLOW   APPearance CLEAR (A) CLEAR   Specific Gravity, Urine 1.014 1.005 - 1.030   pH 5.0 5.0 - 8.0   Glucose, UA NEGATIVE NEGATIVE mg/dL   Hgb urine dipstick NEGATIVE NEGATIVE   Bilirubin Urine NEGATIVE NEGATIVE   Ketones, ur 5 (A) NEGATIVE mg/dL   Protein, ur NEGATIVE NEGATIVE mg/dL   Nitrite NEGATIVE NEGATIVE   Leukocytes,Ua NEGATIVE NEGATIVE   WBC, UA 0-5 0 - 5 WBC/hpf   Bacteria, UA NONE SEEN NONE SEEN   Squamous Epithelial / LPF 0-5 0 - 5   Mucus PRESENT    Hyaline Casts, UA PRESENT     Comment: Performed at [facility], [address], [place], [place] [address]  Urine Drug Screen, Qualitative ([facility] only)     Status: None   Collection Time: 11/28/18  3:25 AM  Result Value Ref Range   Tricyclic, Ur Screen NONE DETECTED NONE DETECTED   Amphetamines, Ur Screen NONE DETECTED NONE DETECTED   MDMA  (Ecstasy)Ur Screen NONE DETECTED NONE DETECTED   Cocaine Metabolite,Ur Force NONE DETECTED NONE DETECTED   Opiate, Ur Screen NONE DETECTED NONE DETECTED   Phencyclidine (PCP) Ur S NONE DETECTED NONE DETECTED   Cannabinoid 50 Ng, Ur [place] NONE DETECTED NONE DETECTED   Barbiturates, Ur Screen NONE DETECTED NONE DETECTED   Benzodiazepine, Ur Scrn NONE DETECTED NONE DETECTED   Methadone Scn, Ur NONE DETECTED NONE DETECTED    Comment: (NOTE) Tricyclics + metabolites, urine    Cutoff 1000 ng/mL Amphetamines + metabolites, urine  Cutoff 1000 ng/mL MDMA (Ecstasy), urine              Cutoff 500 ng/mL Cocaine Metabolite, urine          Cutoff 300 ng/mL Opiate + metabolites, urine        Cutoff 300 ng/mL Phencyclidine (PCP), urine         Cutoff 25 ng/mL Cannabinoid, urine                 Cutoff 50 ng/mL Barbiturates + metabolites, urine  Cutoff 200 ng/mL Benzodiazepine, urine              Cutoff 200 ng/mL Methadone, urine                   Cutoff 300 ng/mL The urine drug screen provides only a preliminary, unconfirmed analytical test result and should not be used for non-medical purposes. Clinical consideration and professional judgment should be applied to any positive drug screen result due to possible interfering substances. A more specific alternate chemical method must be used in order to obtain a confirmed analytical result. Gas chromatography / mass spectrometry (GC/MS) is the preferred confirmat ory method. Performed at [facility], [address] [place], [place] [address]   Pregnancy, urine POC     Status: None   Collection Time: 11/28/18  3:32 AM  Result Value Ref Range   Preg Test, Ur NEGATIVE NEGATIVE    Comment:        THE SENSITIVITY OF THIS METHODOLOGY IS >24 mIU/mL   Procalcitonin - Baseline     Status: None   Collection Time: 11/28/18 12:25 PM  Result Value Ref Range   Procalcitonin <0.10 ng/mL    Comment:        Interpretation: PCT (Procalcitonin) <= 0.5  ng/mL: Systemic infection (sepsis) is not likely. Local bacterial infection is possible. (NOTE)       Sepsis PCT Algorithm           Lower Respiratory Tract                                      Infection PCT Algorithm    ----------------------------     ----------------------------         PCT < 0.25 ng/mL                PCT < 0.10 ng/mL         Strongly encourage             Strongly discourage   discontinuation of antibiotics    initiation of antibiotics    ----------------------------     -----------------------------       PCT 0.25 - 0.50  ng/mL            PCT 0.10 - 0.25 ng/mL               OR       >80% decrease in PCT            Discourage initiation of                                            antibiotics      Encourage discontinuation           of antibiotics    ----------------------------     -----------------------------         PCT >= 0.50 ng/mL              PCT 0.26 - 0.50 ng/mL               AND        <80% decrease in PCT             Encourage initiation of                                             antibiotics       Encourage continuation           of antibiotics    ----------------------------     -----------------------------        PCT >= 0.50 ng/mL                  PCT > 0.50 ng/mL               AND         increase in PCT                  Strongly encourage                                      initiation of antibiotics    Strongly encourage escalation           of antibiotics                                     -----------------------------                                           PCT <= 0.25 ng/mL                                                 OR                                        > 80% decrease in PCT  Discontinue / Do not initiate                                             antibiotics Performed at [facility], [address] [place], [place] [address]     Current Facility-Administered  Medications  Medication Dose Route Frequency Provider Last Rate Last Dose  . 0.9 %  sodium chloride infusion   Intravenous Continuous Epifanio Lesches, MD 125 mL/hr at 11/28/18 [phone]    . acetaminophen (TYLENOL) tablet 650 mg  650 mg Oral Q6H PRN Harrie Foreman, MD   650 mg at 11/28/18 1203   Or  . acetaminophen (TYLENOL) suppository 650 mg  650 mg Rectal Q6H PRN Harrie Foreman, MD      . acetaminophen (TYLENOL) tablet 1,000 mg  1,000 mg Oral Once Harrie Foreman, MD      . dicyclomine (BENTYL) capsule 10 mg  10 mg Oral TID AC & HS Harrie Foreman, MD      . docusate sodium (COLACE) capsule 100 mg  100 mg Oral BID Harrie Foreman, MD      . enoxaparin (LOVENOX) injection 40 mg  40 mg Subcutaneous Q24H Harrie Foreman, MD   40 mg at 11/28/18 1039  . FLUoxetine (PROZAC) capsule 40 mg  40 mg Oral Daily Harrie Foreman, MD      . hydrochlorothiazide (HYDRODIURIL) tablet 25 mg  25 mg Oral Daily Harrie Foreman, MD   25 mg at 11/28/18 1002  . ketorolac (TORADOL) 15 MG/ML injection 15 mg  15 mg Intravenous Q6H Konidena, [name], MD      . labetalol (NORMODYNE) injection 5-10 mg  5-10 mg Intravenous Q2H PRN Harrie Foreman, MD      . lamoTRIgine (LAMICTAL) tablet 150 mg  150 mg Oral BID Harrie Foreman, MD      . lisinopril (ZESTRIL) tablet 10 mg  10 mg Oral Daily Harrie Foreman, MD   10 mg at 11/28/18 1002  . ondansetron (ZOFRAN) tablet 4 mg  4 mg Oral Q6H PRN Harrie Foreman, MD       Or  . ondansetron [facility]) injection 4 mg  4 mg Intravenous Q6H PRN Harrie Foreman, MD   4 mg at 11/28/18 1002  . pantoprazole (PROTONIX) EC tablet 40 mg  40 mg Oral Daily Harrie Foreman, MD   40 mg at 11/28/18 1002  . prochlorperazine (COMPAZINE) injection 10 mg  10 mg Intravenous Q6H PRN Harrie Foreman, MD      . risperiDONE (RISPERDAL) tablet 0.5 mg  0.5 mg Oral BID Harrie Foreman, MD      . sodium chloride flush (NS) 0.9 % injection 3 mL  3 mL Intravenous  Once Harrie Foreman, MD      . sucralfate (CARAFATE) tablet 1 g  1 g Oral Once Harrie Foreman, MD      . tiZANidine (ZANAFLEX) tablet 8 mg  8 mg Oral TID Harrie Foreman, MD      . traZODone (DESYREL) tablet 150 mg  150 mg Oral QHS Harrie Foreman, MD        Musculoskeletal: Strength & Muscle Tone: within normal limits Gait & Station: normal Patient leans: NA  Psychiatric Specialty Exam: Physical Exam  Nursing note and vitals reviewed. Constitutional: She is oriented to person, place,  and time. She appears well-developed and well-nourished.  HENT:  Head: Normocephalic.  Neck: Normal range of motion.  Respiratory: Effort normal.  Musculoskeletal: Normal range of motion.  Neurological: She is alert and oriented to person, place, and time.  Psychiatric: Her speech is normal and behavior is normal. Judgment and thought content normal. Her mood appears anxious. Her affect is blunt. Cognition and memory are normal.    Review of Systems  Gastrointestinal: Positive for abdominal pain, nausea and vomiting.  Psychiatric/Behavioral: The patient is nervous/anxious.   All other systems reviewed and are negative.   Blood pressure (!) 156/95, pulse 77, temperature 98.7 F (37.1 C), temperature source Oral, resp. rate 18, height 5\' 4"  (1.626 m), weight 81.6 kg, SpO2 94 %.Body mass index is 30.9 kg/m.  General Appearance: Casual  Eye Contact:  Good  Speech:  Normal Rate  Volume:  Decreased  Mood:  Anxious  Affect:  Blunt  Thought Process:  Coherent and Descriptions of Associations: Intact  Orientation:  Full (Time, Place, and Person)  Thought Content:  WDL and Logical  Suicidal Thoughts:  No  Homicidal Thoughts:  No  Memory:  Immediate;   Good Recent;   Good Remote;   Good  Judgement:  Fair  Insight:  Fair  Psychomotor Activity:  Decreased  Concentration:  Concentration: Good and Attention Span: Good  Recall:  Good  Fund of Knowledge:  Good  Language:  Good   Akathisia:  No  Handed:  Right  AIMS (if indicated):     Assets:  Housing Intimacy Leisure Time Resilience Social Support  ADL's:  Intact  Cognition:  WNL  Sleep:        Treatment Plan Summary: Daily contact with patient to assess and evaluate symptoms and progress in treatment, Medication management and Plan bipolar affective disorder:  -Continue Prozac 40 mg daily -Continue Lamictal 150 mg BID -Continue Risperdal 0.5 mg BID -Follow up with outpatient provider, Dr Moshe Salisbury  Insomnia: -Continue Trazodone 150 mg at bedtime  Disposition: No evidence of imminent risk to self or others at present.   Supportive therapy provided about ongoing stressors. Discussed crisis plan, support from social network, calling 911, coming to the Emergency Department, and calling Suicide Hotline.  Waylan Boga, NP 11/28/2018 3:23 PM

## 2018-11-28 NOTE — ED Notes (Signed)
ED TO INPATIENT HANDOFF REPORT  ED Nurse Name and Phone #: Adonai Helzer [phone]  S Name/Age/Gender [name] 48 y.o. female Room/Bed: ED07A/ED07A  Code Status   Code Status: Prior  Home/SNF/Other Home Patient oriented to: self, place, time and situation Is this baseline? Yes   Triage Complete: Triage complete  Chief Complaint Emesis  Triage Note Patient c/o emesis and generalized body aches.    Allergies Allergies  Allergen Reactions  . Lactose Intolerance (Gi) Nausea Only    Level of Care/Admitting Diagnosis ED Disposition    ED Disposition Condition [facility]: [place] [100120]  Level of Care: Med-Surg [16]  Covid Evaluation: Confirmed COVID Negative  Diagnosis: Intractable nausea and vomiting [384665]  Admitting Physician: Harrie Foreman [[id number]]  Attending Physician: Harrie Foreman [phone]  PT Class (Do Not Modify): Observation [104]  PT Acc Code (Do Not Modify): Observation [10022]       B Medical/Surgery History Past Medical History:  Diagnosis Date  . Anxiety   . Arthritis    "joints ache all over" (10/15/2014)  . Bulging lumbar disc   . Chronic lower back pain   . DDD (degenerative disc disease), cervical   . Depression   . Drug-seeking behavior   . Headache    "weekly" (10/15/2014)  . Hyperlipemia   . Hypertension   . PTSD (post-traumatic stress disorder)   . Skin cancer    "had them cut off my arms; don't know what kind"   Past Surgical History:  Procedure Laterality Date  . ABLATION ON ENDOMETRIOSIS  2008  . HEMORRHOID SURGERY  ~ 2002     A IV Location/Drains/Wounds Patient Lines/Drains/Airways Status   Active Line/Drains/Airways    Name:   Placement date:   Placement time:   Site:   Days:   Peripheral IV 11/28/18 Right Hand   11/28/18    0133    Hand   less than 1          Intake/Output Last 24 hours  Intake/Output Summary (Last 24 hours) at 11/28/2018 0710 Last data  filed at 11/28/2018 0325 Gross per 24 hour  Intake 1000 ml  Output -  Net 1000 ml    Labs/Imaging Results for orders placed or performed during the hospital encounter of 11/28/18 (from the past 48 hour(s))  Lipase, blood     Status: None   Collection Time: 11/28/18  1:35 AM  Result Value Ref Range   Lipase 28 11 - 51 U/L    Comment: Performed at [facility], [address] [place], [place] [address]  Comprehensive metabolic panel     Status: Abnormal   Collection Time: 11/28/18  1:35 AM  Result Value Ref Range   Sodium 140 135 - 145 mmol/L   Potassium 3.7 3.5 - 5.1 mmol/L   Chloride 104 98 - 111 mmol/L   CO2 21 (L) 22 - 32 mmol/L   Glucose, Bld 132 (H) 70 - 99 mg/dL   BUN 8 6 - 20 mg/dL   Creatinine, Ser 0.96 0.44 - 1.00 mg/dL   Calcium 9.1 8.9 - 10.3 mg/dL   Total Protein 8.5 (H) 6.5 - 8.1 g/dL   Albumin 4.2 3.5 - 5.0 g/dL   AST 27 15 - 41 U/L   ALT 15 0 - 44 U/L   Alkaline Phosphatase 100 38 - 126 U/L   Total Bilirubin 0.4 0.3 - 1.2 mg/dL   GFR calc non Af Amer >60 >  60 mL/min   GFR calc Af Amer >60 >60 mL/min   Anion gap 15 5 - 15    Comment: Performed at [facility] [address] [place], [place] [address]  CBC     Status: Abnormal   Collection Time: 11/28/18  1:35 AM  Result Value Ref Range   WBC 15.8 (H) 4.0 - 10.5 K/uL   RBC 4.08 3.87 - 5.11 MIL/uL   Hemoglobin 10.3 (L) 12.0 - 15.0 g/dL   HCT 32.8 (L) 36.0 - 46.0 %   MCV 80.4 80.0 - 100.0 fL   MCH 25.2 (L) 26.0 - 34.0 pg   MCHC 31.4 30.0 - 36.0 g/dL   RDW 15.5 11.5 - 15.5 %   Platelets 498 (H) 150 - 400 K/uL   nRBC 0.0 0.0 - 0.2 %    Comment: Performed at [facility], [address] [place], [place] [address]  SARS Coronavirus 2 [facility] order, Performed in [facility] hospital lab) Nasopharyngeal Nasopharyngeal Swab     Status: None   Collection Time: 11/28/18  1:45 AM   Specimen: Nasopharyngeal Swab  Result Value Ref Range   SARS Coronavirus 2 NEGATIVE NEGATIVE     Comment: (NOTE) If result is NEGATIVE SARS-CoV-2 target nucleic acids are NOT DETECTED. The SARS-CoV-2 RNA is generally detectable in upper and lower  respiratory specimens during the acute phase of infection. The lowest  concentration of SARS-CoV-2 viral copies this assay can detect is 250  copies / mL. A negative result does not preclude SARS-CoV-2 infection  and should not be used as the sole basis for treatment or other  patient management decisions.  A negative result may occur with  improper specimen collection / handling, submission of specimen other  than nasopharyngeal swab, presence of viral mutation(s) within the  areas targeted by this assay, and inadequate number of viral copies  (<250 copies / mL). A negative result must be combined with clinical  observations, patient history, and epidemiological information. If result is POSITIVE SARS-CoV-2 target nucleic acids are DETECTED. The SARS-CoV-2 RNA is generally detectable in upper and lower  respiratory specimens dur ing the acute phase of infection.  Positive  results are indicative of active infection with SARS-CoV-2.  Clinical  correlation with patient history and other diagnostic information is  necessary to determine patient infection status.  Positive results do  not rule out bacterial infection or co-infection with other viruses. If result is PRESUMPTIVE POSTIVE SARS-CoV-2 nucleic acids MAY BE PRESENT.   A presumptive positive result was obtained on the submitted specimen  and confirmed on repeat testing.  While 2019 novel coronavirus  (SARS-CoV-2) nucleic acids may be present in the submitted sample  additional confirmatory testing may be necessary for epidemiological  and / or clinical management purposes  to differentiate between  SARS-CoV-2 and other Sarbecovirus currently known to infect humans.  If clinically indicated additional testing with an alternate test  methodology [phone]) is advised. The SARS-CoV-2  RNA is generally  detectable in upper and lower respiratory sp ecimens during the acute  phase of infection. The expected result is Negative. Fact Sheet for Patients:  StrictlyIdeas.no Fact Sheet for Healthcare Providers: BankingDealers.co.za This test is not yet approved or cleared by the [place] FDA and has been authorized for detection and/or diagnosis of SARS-CoV-2 by FDA under an Emergency Use Authorization (EUA).  This EUA will remain in effect (meaning this test can be used) for the duration of the COVID-19 declaration under Section 564(b)(1)  of the Act, 21 U.S.C. section 360bbb-3(b)(1), unless the authorization is terminated or revoked sooner. Performed at [facility], [address] [place], [place] [address]   Urinalysis, Complete w Microscopic     Status: Abnormal   Collection Time: 11/28/18  3:25 AM  Result Value Ref Range   Color, Urine YELLOW (A) YELLOW   APPearance CLEAR (A) CLEAR   Specific Gravity, Urine 1.014 1.005 - 1.030   pH 5.0 5.0 - 8.0   Glucose, UA NEGATIVE NEGATIVE mg/dL   Hgb urine dipstick NEGATIVE NEGATIVE   Bilirubin Urine NEGATIVE NEGATIVE   Ketones, ur 5 (A) NEGATIVE mg/dL   Protein, ur NEGATIVE NEGATIVE mg/dL   Nitrite NEGATIVE NEGATIVE   Leukocytes,Ua NEGATIVE NEGATIVE   WBC, UA 0-5 0 - 5 WBC/hpf   Bacteria, UA NONE SEEN NONE SEEN   Squamous Epithelial / LPF 0-5 0 - 5   Mucus PRESENT    Hyaline Casts, UA PRESENT     Comment: Performed at [facility], [address] [place], [place] [address]  Urine Drug Screen, Qualitative ([facility] only)     Status: None   Collection Time: 11/28/18  3:25 AM  Result Value Ref Range   Tricyclic, Ur Screen NONE DETECTED NONE DETECTED   Amphetamines, Ur Screen NONE DETECTED NONE DETECTED   MDMA (Ecstasy)Ur Screen NONE DETECTED NONE DETECTED   Cocaine Metabolite,Ur [place] NONE DETECTED NONE DETECTED   Opiate, Ur Screen NONE DETECTED NONE  DETECTED   Phencyclidine (PCP) Ur S NONE DETECTED NONE DETECTED   Cannabinoid 50 Ng, Ur [place] NONE DETECTED NONE DETECTED   Barbiturates, Ur Screen NONE DETECTED NONE DETECTED   Benzodiazepine, Ur Scrn NONE DETECTED NONE DETECTED   Methadone Scn, Ur NONE DETECTED NONE DETECTED    Comment: (NOTE) Tricyclics + metabolites, urine    Cutoff 1000 ng/mL Amphetamines + metabolites, urine  Cutoff 1000 ng/mL MDMA (Ecstasy), urine              Cutoff 500 ng/mL Cocaine Metabolite, urine          Cutoff 300 ng/mL Opiate + metabolites, urine        Cutoff 300 ng/mL Phencyclidine (PCP), urine         Cutoff 25 ng/mL Cannabinoid, urine                 Cutoff 50 ng/mL Barbiturates + metabolites, urine  Cutoff 200 ng/mL Benzodiazepine, urine              Cutoff 200 ng/mL Methadone, urine                   Cutoff 300 ng/mL The urine drug screen provides only a preliminary, unconfirmed analytical test result and should not be used for non-medical purposes. Clinical consideration and professional judgment should be applied to any positive drug screen result due to possible interfering substances. A more specific alternate chemical method must be used in order to obtain a confirmed analytical result. Gas chromatography / mass spectrometry (GC/MS) is the preferred confirmat ory method. Performed at [facility], [address] [place], [place] [address]   Pregnancy, urine POC     Status: None   Collection Time: 11/28/18  3:32 AM  Result Value Ref Range   Preg Test, Ur NEGATIVE NEGATIVE    Comment:        THE SENSITIVITY OF THIS METHODOLOGY IS >24 mIU/mL    Ct Abdomen Pelvis W Contrast  Result Date: 11/28/2018 CLINICAL DATA:  Abdominal pain, acute and generalized with fever EXAM: CT ABDOMEN AND PELVIS WITH CONTRAST TECHNIQUE: Multidetector CT imaging of the abdomen and pelvis was performed using the standard protocol following bolus administration of intravenous contrast. CONTRAST:  137mL  OMNIPAQUE IOHEXOL 300 MG/ML  SOLN COMPARISON:  07/04/2017 FINDINGS: Lower chest: Dependent subpleural densities best attributed atelectasis. Shallow Bochdalek's hernia on the left containing gastric fundus. Hepatobiliary: No focal liver abnormality.No evidence of biliary obstruction or stone. Pancreas: Unremarkable. Spleen: Unremarkable. Adrenals/Urinary Tract: Negative adrenals. No hydronephrosis or stone. Unremarkable bladder. Stomach/Bowel:  No obstruction. No appendicitis. Vascular/Lymphatic: No acute vascular abnormality. No mass or adenopathy. Reproductive:No pathologic findings. Other: No ascites or pneumoperitoneum. Musculoskeletal: No acute abnormalities. IMPRESSION: 1. No acute finding or explanation for pain. 2. Bochdalek's hernia of the left diaphragm that contains gastric fundus. Electronically Signed   By: Monte Fantasia M.D.   On: 11/28/2018 04:23   Dg Chest Portable 1 View  Result Date: 11/28/2018 CLINICAL DATA:  Vomiting, body aches EXAM: PORTABLE CHEST 1 VIEW COMPARISON:  12/23/2017 FINDINGS: Cardiomegaly. Bibasilar atelectasis. No effusions. No acute bony abnormality. No edema. IMPRESSION: Cardiomegaly.  Bibasilar atelectasis. Electronically Signed   By: Rolm Baptise M.D.   On: 11/28/2018 02:02    Pending Labs [employer] (From admission, onward)    Start     Ordered   Signed and Held  Creatinine, serum  (enoxaparin (LOVENOX)    CrCl >/= 30 ml/min)  Weekly,   R    Comments: while on enoxaparin therapy    Signed and Held   Signed and Held  TSH  Add-on,   R     Signed and Held          Vitals/Pain Today's Vitals   11/28/18 0430 11/28/18 0600 11/28/18 0630 11/28/18 0635  BP: 130/77 (!) 168/90 (!) 179/88   Pulse: 97 92 88   Resp: 17 16 14    Temp:      TempSrc:      SpO2: 94% 95% 94%   Weight:      Height:      PainSc:    9     Isolation Precautions No active isolations  Medications Medications  sodium chloride flush (NS) 0.9 % injection 3 mL (3 mLs  Intravenous Refused 11/28/18 0145)  promethazine (PHENERGAN) injection 12.5 mg (12.5 mg Intravenous Given 11/28/18 0144)  acetaminophen (TYLENOL) tablet 1,000 mg (1,000 mg Oral Refused 11/28/18 0327)  sucralfate (CARAFATE) tablet 1 g (1 g Oral Refused 11/28/18 0448)  fentaNYL (SUBLIMAZE) injection 12.5 mcg (has no administration in time range)  ondansetron (ZOFRAN) injection 4 mg (4 mg Intravenous Given 11/28/18 0316)  sodium chloride 0.9 % bolus 1,000 mL (0 mLs Intravenous Stopped 11/28/18 0325)  iohexol (OMNIPAQUE) 300 MG/ML solution 100 mL (100 mLs Intravenous Contrast Given 11/28/18 0304)  LORazepam (ATIVAN) injection 1 mg (1 mg Intravenous Given 11/28/18 0442)  haloperidol lactate (HALDOL) injection 5 mg (5 mg Intramuscular Given 11/28/18 0523)  pantoprazole (PROTONIX) injection 40 mg (40 mg Intravenous Given 11/28/18 0613)  0.9 %  sodium chloride infusion ( Intravenous New Bag/Given 11/28/18 0616)    Mobility walks Low fall risk   Focused Assessments    R Recommendations: See Admitting Provider Note  Report given to:   Additional Notes:

## 2018-11-28 NOTE — ED Provider Notes (Addendum)
[facility] Emergency Department Provider Note    First MD Initiated Contact with Patient 11/28/18 0130     (approximate)  I have reviewed the triage vital signs and the nursing notes.   HISTORY  Chief Complaint Emesis    HPI [name] is a 48 y.o. female below listed past medical history presents the ER for evaluation of nausea vomiting generalized muscle aches chills started this morning.  Not currently on any antibiotics.  No recent known sick contacts.  Is having crampy abdominal pain but no diarrhea.  States she has chronic back pain.  Is having some dysuria.  Tried taking ODT Zofran at home without any improvement.    Past Medical History:  Diagnosis Date  . Anxiety   . Arthritis    "joints ache all over" (10/15/2014)  . Bulging lumbar disc   . Chronic lower back pain   . DDD (degenerative disc disease), cervical   . Depression   . Drug-seeking behavior   . Headache    "weekly" (10/15/2014)  . Hyperlipemia   . Hypertension   . PTSD (post-traumatic stress disorder)   . Skin cancer    "had them cut off my arms; don't know what kind"   No family history on file. Past Surgical History:  Procedure Laterality Date  . ABLATION ON ENDOMETRIOSIS  2008  . HEMORRHOID SURGERY  ~ 2002   Patient Active Problem List   Diagnosis Date Noted  . Acute gastroenteritis 11/19/2015  . Hypomagnesemia   . AKI (acute kidney injury) ([place])   . C. difficile colitis   . SIRS (systemic inflammatory response syndrome) ([place]) 01/11/2015  . Cellulitis 01/03/2015  . Tachycardia   . Dyspnea   . Opiate withdrawal ([place]) 12/18/2014  . Depression 12/17/2014  . Hypotension 12/16/2014  . Herpes simplex virus type 1 (HSV-1) dermatitis   . Sacral fracture ([place]) 12/12/2014  . Fall 12/12/2014  . Nausea and vomiting 12/12/2014  . Leukocytosis 12/12/2014  . Chronic lower back pain 12/12/2014  . Sacral fracture, closed ([place]) 12/12/2014  . Shingles outbreak 12/12/2014   . Nausea with vomiting   . Intractable abdominal pain 10/15/2014  . Abdominal pain 10/15/2014  . Hypokalemia 09/04/2014  . Sepsis ([place]) 09/01/2014  . Abdominal pain, generalized 09/01/2014  . PTSD (post-traumatic stress disorder) 09/01/2014  . Endometriosis 09/01/2014  . Nausea vomiting and diarrhea 09/01/2014  . Sinus tachycardia 09/01/2014  . Lactic acidosis 09/01/2014  . Severe sepsis ([place]) 09/01/2014      Prior to Admission medications   Medication Sig Start Date End Date Taking? Authorizing Provider  FLUoxetine (PROZAC) 40 MG capsule Take 40 mg by mouth daily.  06/26/17  Yes [provider]  hydrochlorothiazide (HYDRODIURIL) 25 MG tablet Take 25 mg by mouth daily.  06/26/17  Yes [provider]  lamoTRIgine (LAMICTAL) 150 MG tablet Take 150 mg by mouth 2 (two) times daily.   Yes [provider]  lisinopril (ZESTRIL) 10 MG tablet Take 10 mg by mouth daily.    Yes [provider]  promethazine (PHENERGAN) 25 MG tablet Take 1 tablet (25 mg total) by mouth every 8 (eight) hours as needed for nausea or vomiting. 12/18/16  Yes Menshew, Dannielle Karvonen, PA-C  risperiDONE (RISPERDAL) 0.5 MG tablet Take 0.5 mg by mouth 2 (two) times daily. 03/03/18  Yes [provider]  tiZANidine (ZANAFLEX) 4 MG tablet Take 4 mg by mouth 3 (three) times daily.    Yes [provider]  traZODone (DESYREL) 150 MG tablet Take 150 mg by mouth at bedtime.  06/03/17  Yes [provider]  ibuprofen (ADVIL,MOTRIN) 200 MG tablet Take 400 mg by mouth every 6 (six) hours as needed for moderate pain.    [provider]    Allergies Lactose intolerance (gi)    Social History Social History   Tobacco Use  . Smoking status: Never Smoker  . Smokeless tobacco: Never Used  Substance Use Topics  . Alcohol use: Yes    Comment: 10/15/2014 "I've drank before; nothing regular; don't drink now cause of RX I'm on"  . Drug use: No    Review of Systems  Patient denies headaches, rhinorrhea, blurry vision, numbness, shortness of breath, chest pain, edema, cough, abdominal pain, nausea, vomiting, diarrhea, dysuria, fevers, rashes or hallucinations unless otherwise stated above in HPI. ____________________________________________   PHYSICAL EXAM:  VITAL SIGNS: Vitals:   11/28/18 0351 11/28/18 0430  BP: 125/71 130/77  Pulse: 98 97  Resp: 19 17  Temp:    SpO2: 94% 94%    Constitutional: Alert and oriented.  Eyes: Conjunctivae are normal.  Head: Atraumatic. Nose: No congestion/rhinnorhea. Mouth/Throat: Mucous membranes are moist.   Neck: No stridor. Painless ROM.  Cardiovascular: Normal rate, regular rhythm. Grossly normal heart sounds.  Good peripheral circulation. Respiratory: Normal respiratory effort.  No retractions. Lungs CTAB. Gastrointestinal: Soft with mild ttp. No distention. No abdominal bruits. No CVA tenderness. Genitourinary: deferred Musculoskeletal: No lower extremity tenderness nor edema.  No joint effusions. Neurologic:  Normal speech and language. No gross focal neurologic deficits are appreciated. No facial droop Skin:  Skin is warm, dry and intact. No rash noted. Psychiatric: Mood and affect are normal. Speech and behavior are normal.  ____________________________________________   LABS (all labs ordered are listed, but only abnormal results are displayed)  Results for orders placed or performed during the hospital encounter of 11/28/18 (from the past 24 hour(s))  Lipase, blood     Status: None   Collection Time: 11/28/18  1:35 AM  Result Value Ref Range   Lipase 28 11 - 51 U/L  Comprehensive metabolic panel     Status: Abnormal   Collection Time: 11/28/18  1:35 AM  Result Value Ref Range   Sodium 140 135 - 145 mmol/L   Potassium 3.7 3.5 - 5.1 mmol/L   Chloride 104 98 - 111 mmol/L   CO2 21 (L) 22 - 32 mmol/L   Glucose, Bld 132 (H) 70 - 99 mg/dL   BUN 8 6 - 20 mg/dL   Creatinine, Ser 0.96 0.44 - 1.00  mg/dL   Calcium 9.1 8.9 - 10.3 mg/dL   Total Protein 8.5 (H) 6.5 - 8.1 g/dL   Albumin 4.2 3.5 - 5.0 g/dL   AST 27 15 - 41 U/L   ALT 15 0 - 44 U/L   Alkaline Phosphatase 100 38 - 126 U/L   Total Bilirubin 0.4 0.3 - 1.2 mg/dL   GFR calc non Af Amer >60 >60 mL/min   GFR calc Af Amer >60 >60 mL/min   Anion gap 15 5 - 15  CBC     Status: Abnormal   Collection Time: 11/28/18  1:35 AM  Result Value Ref Range   WBC 15.8 (H) 4.0 - 10.5 K/uL   RBC 4.08 3.87 - 5.11 MIL/uL   Hemoglobin 10.3 (L) 12.0 - 15.0 g/dL   HCT 32.8 (L) 36.0 - 46.0 %   MCV 80.4 80.0 - 100.0 fL   MCH 25.2 (L)  26.0 - 34.0 pg   MCHC 31.4 30.0 - 36.0 g/dL   RDW 15.5 11.5 - 15.5 %   Platelets 498 (H) 150 - 400 K/uL   nRBC 0.0 0.0 - 0.2 %  SARS Coronavirus 2 [facility] order, Performed in [facility] hospital lab) Nasopharyngeal Nasopharyngeal Swab     Status: None   Collection Time: 11/28/18  1:45 AM   Specimen: Nasopharyngeal Swab  Result Value Ref Range   SARS Coronavirus 2 NEGATIVE NEGATIVE  Urinalysis, Complete w Microscopic     Status: Abnormal   Collection Time: 11/28/18  3:25 AM  Result Value Ref Range   Color, Urine YELLOW (A) YELLOW   APPearance CLEAR (A) CLEAR   Specific Gravity, Urine 1.014 1.005 - 1.030   pH 5.0 5.0 - 8.0   Glucose, UA NEGATIVE NEGATIVE mg/dL   Hgb urine dipstick NEGATIVE NEGATIVE   Bilirubin Urine NEGATIVE NEGATIVE   Ketones, ur 5 (A) NEGATIVE mg/dL   Protein, ur NEGATIVE NEGATIVE mg/dL   Nitrite NEGATIVE NEGATIVE   Leukocytes,Ua NEGATIVE NEGATIVE   WBC, UA 0-5 0 - 5 WBC/hpf   Bacteria, UA NONE SEEN NONE SEEN   Squamous Epithelial / LPF 0-5 0 - 5   Mucus PRESENT    Hyaline Casts, UA PRESENT   Urine Drug Screen, Qualitative ([facility] only)     Status: None   Collection Time: 11/28/18  3:25 AM  Result Value Ref Range   Tricyclic, Ur Screen NONE DETECTED NONE DETECTED   Amphetamines, Ur Screen NONE DETECTED NONE DETECTED   MDMA (Ecstasy)Ur Screen NONE DETECTED NONE DETECTED   Cocaine  Metabolite,Ur [place] NONE DETECTED NONE DETECTED   Opiate, Ur Screen NONE DETECTED NONE DETECTED   Phencyclidine (PCP) Ur S NONE DETECTED NONE DETECTED   Cannabinoid 50 Ng, Ur [place] NONE DETECTED NONE DETECTED   Barbiturates, Ur Screen NONE DETECTED NONE DETECTED   Benzodiazepine, Ur Scrn NONE DETECTED NONE DETECTED   Methadone Scn, Ur NONE DETECTED NONE DETECTED  Pregnancy, urine POC     Status: None   Collection Time: 11/28/18  3:32 AM  Result Value Ref Range   Preg Test, Ur NEGATIVE NEGATIVE   ____________________________________________  ED ECG REPORT I, Merlyn Lot, the attending physician, personally viewed and interpreted this ECG.   Date: 11/28/2018  EKG Time:  5:22  Rate: 80  Rhythm: sinus  Axis: normal  Intervals:normal intervals  ST&T Change: no stemi, nonsepcific st abn  ____________________________________________  RADIOLOGY  I personally reviewed all radiographic images ordered to evaluate for the above acute complaints and reviewed radiology reports and findings.  These findings were personally discussed with the patient.  Please see medical record for radiology report.  ____________________________________________   PROCEDURES  Procedure(s) performed:  Procedures    Critical Care performed: no ____________________________________________   INITIAL IMPRESSION / ASSESSMENT AND PLAN / ED COURSE  Pertinent labs & imaging results that were available during my care of the patient were reviewed by me and considered in my medical decision making (see chart for details).   DDX: enteritis, gastritis, cholelithiasis, cholecystitis, uti, pyelo, covid 19  [name] is a 48 y.o. who presents to the ED with generalized body aches nausea and vomiting for the past 24 hours.  Patient nontoxic-appearing.  Will send letter for above differential.  CT imaging will be ordered to evaluate for obstructive process and exclude appendicitis or biliary pathology.  The  patient will be placed on continuous pulse oximetry and telemetry for monitoring.  Laboratory evaluation  will be sent to evaluate for the above complaints.     Clinical Course as of Nov 27 557  Fri Nov 28, 2018  0431 CT imaging is reassuring.  Patient states that she does feel very anxious and is requesting something for her nerves.  We will give her dose of IV Ativan.  Review of her medication history do not see anything that would be causing withdrawal.  Repeat abdominal exam is benign.   [PR]  [phone] Patient with significant improvement in symptoms after Ativan.  Will trial p.o. challenge.   [PR]  [phone] Patient still feels anxious and started vomiting again.  Will try low-dose of Haldol.  If she is still vomiting after this third anti-emetic will discuss with hospitalist for admission   [PR]  [phone] Patient is still vomiting.  Will discuss with hospitalist for admission   [PR]    Clinical Course User Index [PR] Merlyn Lot, MD    The patient was evaluated in Emergency Department today for the symptoms described in the history of present illness. He/she was evaluated in the context of the global COVID-19 pandemic, which necessitated consideration that the patient might be at risk for infection with the SARS-CoV-2 virus that causes COVID-19. Institutional protocols and algorithms that pertain to the evaluation of patients at risk for COVID-19 are in a state of rapid change based on information released by regulatory bodies including the CDC and federal and state organizations. These policies and algorithms were followed during the patient's care in the ED.  As part of my medical decision making, I reviewed the following data within the [place] notes reviewed and incorporated, Labs reviewed, notes from prior ED visits and [place] Controlled Substance Database   ____________________________________________   FINAL CLINICAL IMPRESSION(S) / ED DIAGNOSES  Final  diagnoses:  Intractable vomiting with nausea, unspecified vomiting type      NEW MEDICATIONS STARTED DURING THIS VISIT:  New Prescriptions   No medications on file     Note:  This document was prepared using Dragon voice recognition software and may include unintentional dictation errors.    Merlyn Lot, MD 11/28/18 Reed Pandy    Merlyn Lot, MD 12/04/18 [phone]

## 2018-11-28 NOTE — Progress Notes (Signed)
Patient is admitted for intractable nausea, vomiting, abdominal pain.  Patient blood work is largely normal.  #1 intractable nausea, vomiting, continue IV PPIs, IV Zofran, Phenergan . 2.  Generalized abdominal pain likely secondary to gastritis, continue PPIs.  Continue clear liquids today.  3.  Depression,, bipolar disorder; is on Prozac, Risperdal, Lamictal, psychiatric consult placed as her present problem of abdominal pain, vomiting likely secondary to underlying psychiatric problem. #4 leukocytosis likely reactive.  Check procalcitonin.  #5 .essential hypertension continue lisinopril, hydrochlorothiazide.Marland Kitchen

## 2018-11-28 NOTE — ED Notes (Signed)
X-ray at bedside

## 2018-11-28 NOTE — H&P (Signed)
[name] is an 48 y.o. female.   Chief Complaint: Nausea and vomiting HPI: The patient with past medical history of hypertension, chronic back pain and depression presents to the emergency department complaining of nausea and vomiting.  The patient has had nonbloody nonbilious emesis x2 days.  She denies fevers.  She denies sick contacts.  In emergency department she received multiple doses of antiemetic as well as Haldol without relief which prompted the emergency department staff to call the hospitalist service for admission.  Past Medical History:  Diagnosis Date  . Anxiety   . Arthritis    "joints ache all over" (10/15/2014)  . Bulging lumbar disc   . Chronic lower back pain   . DDD (degenerative disc disease), cervical   . Depression   . Drug-seeking behavior   . Headache    "weekly" (10/15/2014)  . Hyperlipemia   . Hypertension   . PTSD (post-traumatic stress disorder)   . Skin cancer    "had them cut off my arms; don't know what kind"    Past Surgical History:  Procedure Laterality Date  . ABLATION ON ENDOMETRIOSIS  2008  . HEMORRHOID SURGERY  ~ 2002    No family history on file.  No known family history of heart disease or diabetes.  The patient reports that her immediate family is all deceased  Social History:  reports that she has never smoked. She has never used smokeless tobacco. She reports current alcohol use. She reports that she does not use drugs.  Allergies:  Allergies  Allergen Reactions  . Lactose Intolerance (Gi) Nausea Only    Prior to Admission medications   Medication Sig Start Date End Date Taking? Authorizing Provider  FLUoxetine (PROZAC) 40 MG capsule Take 40 mg by mouth daily.  06/26/17  Yes [provider]  hydrochlorothiazide (HYDRODIURIL) 25 MG tablet Take 25 mg by mouth daily.  06/26/17  Yes [provider]  ibuprofen (ADVIL,MOTRIN) 200 MG tablet Take 400 mg by mouth every 6 (six) hours as needed for moderate pain.   Yes  [provider]  lamoTRIgine (LAMICTAL) 150 MG tablet Take 150 mg by mouth 2 (two) times daily.   Yes [provider]  lisinopril (ZESTRIL) 10 MG tablet Take 10 mg by mouth daily.    Yes [provider]  omeprazole (PRILOSEC) 20 MG capsule Take 20 mg by mouth daily.   Yes [provider]  risperiDONE (RISPERDAL) 0.5 MG tablet Take 0.5 mg by mouth 2 (two) times daily. 03/03/18  Yes [provider]  tiZANidine (ZANAFLEX) 4 MG tablet Take 8 mg by mouth 3 (three) times daily.    Yes [provider]  traZODone (DESYREL) 150 MG tablet Take 150 mg by mouth at bedtime.  06/03/17  Yes [provider]  promethazine (PHENERGAN) 25 MG tablet Take 1 tablet (25 mg total) by mouth every 8 (eight) hours as needed for nausea or vomiting. Patient not taking: Reported on 11/28/2018 12/18/16   Menshew, Dannielle Karvonen, PA-C     Results for orders placed or performed during the hospital encounter of 11/28/18 (from the past 48 hour(s))  Lipase, blood     Status: None   Collection Time: 11/28/18  1:35 AM  Result Value Ref Range   Lipase 28 11 - 51 U/L    Comment: Performed at [facility], [address] [place], [place] [address]  Comprehensive metabolic panel     Status: Abnormal   Collection Time: 11/28/18  1:35 AM  Result Value Ref Range   Sodium 140 135 - 145 mmol/L   Potassium 3.7 3.5 - 5.1 mmol/L   Chloride 104 98 - 111 mmol/L   CO2 21 (L) 22 - 32 mmol/L   Glucose, Bld 132 (H) 70 - 99 mg/dL   BUN 8 6 - 20 mg/dL   Creatinine, Ser 0.96 0.44 - 1.00 mg/dL   Calcium 9.1 8.9 - 10.3 mg/dL   Total Protein 8.5 (H) 6.5 - 8.1 g/dL   Albumin 4.2 3.5 - 5.0 g/dL   AST 27 15 - 41 U/L   ALT 15 0 - 44 U/L   Alkaline Phosphatase 100 38 - 126 U/L   Total Bilirubin 0.4 0.3 - 1.2 mg/dL   GFR calc non Af Amer >60 >60 mL/min   GFR calc Af Amer >60 >60 mL/min   Anion gap 15 5 - 15    Comment: Performed at [facility], [address]  [place], [place] [address]  CBC     Status: Abnormal   Collection Time: 11/28/18  1:35 AM  Result Value Ref Range   WBC 15.8 (H) 4.0 - 10.5 K/uL   RBC 4.08 3.87 - 5.11 MIL/uL   Hemoglobin 10.3 (L) 12.0 - 15.0 g/dL   HCT 32.8 (L) 36.0 - 46.0 %   MCV 80.4 80.0 - 100.0 fL   MCH 25.2 (L) 26.0 - 34.0 pg   MCHC 31.4 30.0 - 36.0 g/dL   RDW 15.5 11.5 - 15.5 %   Platelets 498 (H) 150 - 400 K/uL   nRBC 0.0 0.0 - 0.2 %    Comment: Performed at [facility], [address] [place], [place] [address]  SARS Coronavirus 2 [facility] order, Performed in [facility] hospital lab) Nasopharyngeal Nasopharyngeal Swab     Status: None   Collection Time: 11/28/18  1:45 AM   Specimen: Nasopharyngeal Swab  Result Value Ref Range   SARS Coronavirus 2 NEGATIVE NEGATIVE    Comment: (NOTE) If result is NEGATIVE SARS-CoV-2 target nucleic acids are NOT DETECTED. The SARS-CoV-2 RNA is generally detectable in upper and lower  respiratory specimens during the acute phase of infection. The lowest  concentration of SARS-CoV-2 viral copies this assay can detect is 250  copies / mL. A negative result does not preclude SARS-CoV-2 infection  and should not be used as the sole basis for treatment or other  patient management decisions.  A negative result may occur with  improper specimen collection / handling, submission of specimen other  than nasopharyngeal swab, presence of viral mutation(s) within the  areas targeted by this assay, and inadequate number of viral copies  (<250 copies / mL). A negative result must be combined with clinical  observations, patient history, and epidemiological information. If result is POSITIVE SARS-CoV-2 target nucleic acids are DETECTED. The SARS-CoV-2 RNA is generally detectable in upper and lower  respiratory specimens dur ing the acute phase of infection.  Positive  results are indicative of active infection with SARS-CoV-2.  Clinical  correlation with patient history and  other diagnostic information is  necessary to determine patient infection status.  Positive results do  not rule out bacterial infection or co-infection with other viruses. If result is PRESUMPTIVE POSTIVE SARS-CoV-2 nucleic acids MAY BE PRESENT.   A presumptive positive result was obtained on the submitted specimen  and confirmed on repeat testing.  While 2019 novel coronavirus  (SARS-CoV-2) nucleic acids may be present in the submitted sample  additional  confirmatory testing may be necessary for epidemiological  and / or clinical management purposes  to differentiate between  SARS-CoV-2 and other Sarbecovirus currently known to infect humans.  If clinically indicated additional testing with an alternate test  methodology [phone]) is advised. The SARS-CoV-2 RNA is generally  detectable in upper and lower respiratory sp ecimens during the acute  phase of infection. The expected result is Negative. Fact Sheet for Patients:  StrictlyIdeas.no Fact Sheet for Healthcare Providers: BankingDealers.co.za This test is not yet approved or cleared by the [place] FDA and has been authorized for detection and/or diagnosis of SARS-CoV-2 by FDA under an Emergency Use Authorization (EUA).  This EUA will remain in effect (meaning this test can be used) for the duration of the COVID-19 declaration under Section 564(b)(1) of the Act, 21 U.S.C. section 360bbb-3(b)(1), unless the authorization is terminated or revoked sooner. Performed at [facility], [address] [place], [place] [address]   Urinalysis, Complete w Microscopic     Status: Abnormal   Collection Time: 11/28/18  3:25 AM  Result Value Ref Range   Color, Urine YELLOW (A) YELLOW   APPearance CLEAR (A) CLEAR   Specific Gravity, Urine 1.014 1.005 - 1.030   pH 5.0 5.0 - 8.0   Glucose, UA NEGATIVE NEGATIVE mg/dL   Hgb urine dipstick NEGATIVE NEGATIVE   Bilirubin Urine  NEGATIVE NEGATIVE   Ketones, ur 5 (A) NEGATIVE mg/dL   Protein, ur NEGATIVE NEGATIVE mg/dL   Nitrite NEGATIVE NEGATIVE   Leukocytes,Ua NEGATIVE NEGATIVE   WBC, UA 0-5 0 - 5 WBC/hpf   Bacteria, UA NONE SEEN NONE SEEN   Squamous Epithelial / LPF 0-5 0 - 5   Mucus PRESENT    Hyaline Casts, UA PRESENT     Comment: Performed at [facility], [address], [place], [place] [address]  Urine Drug Screen, Qualitative ([facility] only)     Status: None   Collection Time: 11/28/18  3:25 AM  Result Value Ref Range   Tricyclic, Ur Screen NONE DETECTED NONE DETECTED   Amphetamines, Ur Screen NONE DETECTED NONE DETECTED   MDMA (Ecstasy)Ur Screen NONE DETECTED NONE DETECTED   Cocaine Metabolite,Ur [place] NONE DETECTED NONE DETECTED   Opiate, Ur Screen NONE DETECTED NONE DETECTED   Phencyclidine (PCP) Ur S NONE DETECTED NONE DETECTED   Cannabinoid 50 Ng, Ur [place] NONE DETECTED NONE DETECTED   Barbiturates, Ur Screen NONE DETECTED NONE DETECTED   Benzodiazepine, Ur Scrn NONE DETECTED NONE DETECTED   Methadone Scn, Ur NONE DETECTED NONE DETECTED    Comment: (NOTE) Tricyclics + metabolites, urine    Cutoff 1000 ng/mL Amphetamines + metabolites, urine  Cutoff 1000 ng/mL MDMA (Ecstasy), urine              Cutoff 500 ng/mL Cocaine Metabolite, urine          Cutoff 300 ng/mL Opiate + metabolites, urine        Cutoff 300 ng/mL Phencyclidine (PCP), urine         Cutoff 25 ng/mL Cannabinoid, urine                 Cutoff 50 ng/mL Barbiturates + metabolites, urine  Cutoff 200 ng/mL Benzodiazepine, urine              Cutoff 200 ng/mL Methadone, urine                   Cutoff 300 ng/mL The urine drug screen provides only a preliminary, unconfirmed  analytical test result and should not be used for non-medical purposes. Clinical consideration and professional judgment should be applied to any positive drug screen result due to possible interfering substances. A more specific alternate chemical method must be  used in order to obtain a confirmed analytical result. Gas chromatography / mass spectrometry (GC/MS) is the preferred confirmat ory method. Performed at [facility], [address] [place], [place] [address]   Pregnancy, urine POC     Status: None   Collection Time: 11/28/18  3:32 AM  Result Value Ref Range   Preg Test, Ur NEGATIVE NEGATIVE    Comment:        THE SENSITIVITY OF THIS METHODOLOGY IS >24 mIU/mL    Ct Abdomen Pelvis W Contrast  Result Date: 11/28/2018 CLINICAL DATA:  Abdominal pain, acute and generalized with fever EXAM: CT ABDOMEN AND PELVIS WITH CONTRAST TECHNIQUE: Multidetector CT imaging of the abdomen and pelvis was performed using the standard protocol following bolus administration of intravenous contrast. CONTRAST:  169mL OMNIPAQUE IOHEXOL 300 MG/ML  SOLN COMPARISON:  07/04/2017 FINDINGS: Lower chest: Dependent subpleural densities best attributed atelectasis. Shallow Bochdalek's hernia on the left containing gastric fundus. Hepatobiliary: No focal liver abnormality.No evidence of biliary obstruction or stone. Pancreas: Unremarkable. Spleen: Unremarkable. Adrenals/Urinary Tract: Negative adrenals. No hydronephrosis or stone. Unremarkable bladder. Stomach/Bowel:  No obstruction. No appendicitis. Vascular/Lymphatic: No acute vascular abnormality. No mass or adenopathy. Reproductive:No pathologic findings. Other: No ascites or pneumoperitoneum. Musculoskeletal: No acute abnormalities. IMPRESSION: 1. No acute finding or explanation for pain. 2. Bochdalek's hernia of the left diaphragm that contains gastric fundus. Electronically Signed   By: Monte Fantasia M.D.   On: 11/28/2018 04:23   Dg Chest Portable 1 View  Result Date: 11/28/2018 CLINICAL DATA:  Vomiting, body aches EXAM: PORTABLE CHEST 1 VIEW COMPARISON:  12/23/2017 FINDINGS: Cardiomegaly. Bibasilar atelectasis. No effusions. No acute bony abnormality. No edema. IMPRESSION: Cardiomegaly.  Bibasilar  atelectasis. Electronically Signed   By: Rolm Baptise M.D.   On: 11/28/2018 02:02    Review of Systems  Constitutional: Negative for chills and fever.  HENT: Negative for sore throat and tinnitus.   Eyes: Negative for blurred vision and redness.  Respiratory: Negative for cough and shortness of breath.   Cardiovascular: Negative for chest pain, palpitations, orthopnea and PND.  Gastrointestinal: Positive for abdominal pain, nausea and vomiting. Negative for diarrhea.  Genitourinary: Negative for dysuria, frequency and urgency.  Musculoskeletal: Negative for joint pain and myalgias.  Skin: Negative for rash.       No lesions  Neurological: Negative for speech change, focal weakness and weakness.  Endo/Heme/Allergies: Does not bruise/bleed easily.       No temperature intolerance  Psychiatric/Behavioral: Negative for depression and suicidal ideas.    Blood pressure (!) 179/88, pulse 88, temperature 99.5 F (37.5 C), temperature source Oral, resp. rate 14, height 5\' 4"  (1.626 m), weight 81.6 kg, SpO2 94 %. Physical Exam  Vitals reviewed. Constitutional: She is oriented to person, place, and time. She appears well-developed and well-nourished. No distress.  HENT:  Head: Normocephalic and atraumatic.  Mouth/Throat: Oropharynx is clear and moist.  Eyes: Pupils are equal, round, and reactive to light. Conjunctivae and EOM are normal. No scleral icterus.  Neck: Normal range of motion. Neck supple. No JVD present. No tracheal deviation present. No thyromegaly present.  Cardiovascular: Normal rate, regular rhythm and normal heart sounds. Exam reveals no gallop and no friction rub.  No murmur heard. Respiratory: Effort normal and breath sounds normal.  GI: Soft. Bowel sounds are normal. She exhibits no distension. There is no abdominal tenderness.  Genitourinary:    Genitourinary Comments: Deferred   Musculoskeletal: Normal range of motion.        General: No edema.  Lymphadenopathy:     She has no cervical adenopathy.  Neurological: She is alert and oriented to person, place, and time. No cranial nerve deficit. She exhibits normal muscle tone.  Skin: Skin is warm and dry.  Psychiatric: She has a normal mood and affect. Her behavior is normal. Judgment and thought content normal.     Assessment/Plan This is a 48 year old female admitted for nausea and vomiting. 1.  Nausea vomiting: Intractable; continue Zofran as needed.  Compazine for refractory nausea and vomiting. 2.  Hypertension: Uncontrolled; exacerbated by retching.  Continue lisinopril and hydrochlorothiazide.  Labetalol as needed. 3.  Chronic back pain: Multifactorial; I have held the patient's NSAIDs at this time due to concern for gastritis.  I have ordered 1 dose of fentanyl in the emergency department.  Consider lidocaine or Butrans patch.  May continue tizanidine for muscle spasms 4.  Depression: Uncontrolled; patient denies suicidal or homicidal ideation.  Continue fluoxetine, risperidone, lamotrigine and trazodone.  I have ordered a psychiatry consult as I am concerned that her recurrent vomiting is psychogenic. 5.  DVT prophylaxis: Lovenox 6.  GI prophylaxis: None The patient is a full code.  Time spent on admission orders and patient care approximately 45 minutes  Harrie Foreman, MD 11/28/2018, 6:51 AM

## 2018-11-28 NOTE — Progress Notes (Signed)
Discharge order received. Patient mental status is at baseline. Vital signs stable . No signs of acute distress. Discharge instructions given. Patient verbalized understanding. No other issues noted at this time.   

## 2018-11-28 NOTE — Discharge Summary (Signed)
[name], is a 48 y.o. female  DOB 1971/01/08  MRN [id number].  Admission date:  11/28/2018  Admitting Physician  Harrie Foreman, MD  Discharge Date:  11/28/2018   Primary MD  Andreas Blower, MD  Recommendations for primary care physician for things to follow:   Follow-up with PCP in 1 week Follow-up with patient's primary psychiatrist as scheduled   Admission Diagnosis  Intractable vomiting with nausea, unspecified vomiting type [R11.2]   Discharge Diagnosis  Intractable vomiting with nausea, unspecified vomiting type [R11.2]  Active Problems:   Intractable nausea and vomiting      Past Medical History:  Diagnosis Date  . Anxiety   . Arthritis    "joints ache all over" (10/15/2014)  . Bulging lumbar disc   . Chronic lower back pain   . DDD (degenerative disc disease), cervical   . Depression   . Drug-seeking behavior   . Headache    "weekly" (10/15/2014)  . Hyperlipemia   . Hypertension   . PTSD (post-traumatic stress disorder)   . Skin cancer    "had them cut off my arms; don't know what kind"    Past Surgical History:  Procedure Laterality Date  . ABLATION ON ENDOMETRIOSIS  2008  . HEMORRHOID SURGERY  ~ 2002       History of present illness and  Hospital Course:     Kindly see H&P for history of present illness and admission details, please review complete Labs, Consult reports and Test reports for all details in brief  HPI  from the history and physical done on the day of admission 48 year old female patient with history of depression, bipolar disorder admitted because of generalized abdominal pain, intractable nausea, vomiting.   Hospital Course  1. intractable nausea, vomiting likely secondary to acute gastritis/psychogenic vomiting, patient received IV hydration, IV nausea  medicines including Phenergan, Zofran, also received IV PPIs.  Patient lab work majority of them normal, no acute kidney injury, no leukocytosis.  He did not even have hypokalemia.  Patient told me this afternoon that she feels better and wants to go home.  Discharged home with supply of Zofran if needed.  Patient has no abdominal pain, looks older than stated age.  Denies any diarrhea. 2.  Essential hypertension, patient is on lisinopril, HCTZ. 3.  History of depression, bipolar disorder, patient can take Prozac, Risperdal, Lamictal and follow-up with primary psychiatrist.     Discharge Condition: Stable  Follow UP  Follow-up Information    Andreas Blower, MD. Schedule an appointment as soon as possible for a visit in 1 week(s).   Specialty: Family Medicine Contact information: [place] [address] [phone]             Discharge Instructions  and  Discharge Medications      Allergies as of 11/28/2018      Reactions   Lactose Intolerance (gi) Nausea Only      Medication List    TAKE these medications   FLUoxetine 40 MG capsule Commonly known as: PROZAC Take 40 mg by mouth daily. Notes to patient: Resume at home   hydrochlorothiazide 25 MG tablet Commonly known as: HYDRODIURIL Take 25 mg by mouth daily. Notes to patient: 11/29/2018    ibuprofen 200 MG tablet Commonly known as: ADVIL Take 400 mg by mouth every 6 (six) hours as needed for moderate pain.   lamoTRIgine 150 MG tablet Commonly known as: LAMICTAL Take 150 mg by mouth 2 (two)  times daily. Notes to patient: 11/28/2018    lisinopril 10 MG tablet Commonly known as: ZESTRIL Take 10 mg by mouth daily. Notes to patient: 11/29/2018    omeprazole 20 MG capsule Commonly known as: PRILOSEC Take 20 mg by mouth daily. Notes to patient: 11/29/2018    ondansetron 4 MG disintegrating tablet Commonly known as: Zofran ODT Take 1 tablet (4 mg total) by mouth every 8 (eight) hours as needed for  nausea or vomiting.   promethazine 25 MG tablet Commonly known as: PHENERGAN Take 1 tablet (25 mg total) by mouth every 8 (eight) hours as needed for nausea or vomiting. Notes to patient: Resume as scheduled   risperiDONE 0.5 MG tablet Commonly known as: RISPERDAL Take 0.5 mg by mouth 2 (two) times daily. Notes to patient: resume   tiZANidine 4 MG tablet Commonly known as: ZANAFLEX Take 8 mg by mouth 3 (three) times daily. Notes to patient: resume   traZODone 150 MG tablet Commonly known as: DESYREL Take 150 mg by mouth at bedtime. Notes to patient: Resume as scheduled         Diet and Activity recommendation: See Discharge Instructions above   Consults obtained - none   Major procedures and Radiology Reports - PLEASE review detailed and final reports for all details, in brief -      Ct Abdomen Pelvis W Contrast  Result Date: 11/28/2018 CLINICAL DATA:  Abdominal pain, acute and generalized with fever EXAM: CT ABDOMEN AND PELVIS WITH CONTRAST TECHNIQUE: Multidetector CT imaging of the abdomen and pelvis was performed using the standard protocol following bolus administration of intravenous contrast. CONTRAST:  178mL OMNIPAQUE IOHEXOL 300 MG/ML  SOLN COMPARISON:  07/04/2017 FINDINGS: Lower chest: Dependent subpleural densities best attributed atelectasis. Shallow Bochdalek's hernia on the left containing gastric fundus. Hepatobiliary: No focal liver abnormality.No evidence of biliary obstruction or stone. Pancreas: Unremarkable. Spleen: Unremarkable. Adrenals/Urinary Tract: Negative adrenals. No hydronephrosis or stone. Unremarkable bladder. Stomach/Bowel:  No obstruction. No appendicitis. Vascular/Lymphatic: No acute vascular abnormality. No mass or adenopathy. Reproductive:No pathologic findings. Other: No ascites or pneumoperitoneum. Musculoskeletal: No acute abnormalities. IMPRESSION: 1. No acute finding or explanation for pain. 2. Bochdalek's hernia of the left diaphragm  that contains gastric fundus. Electronically Signed   By: Monte Fantasia M.D.   On: 11/28/2018 04:23   Dg Chest Portable 1 View  Result Date: 11/28/2018 CLINICAL DATA:  Vomiting, body aches EXAM: PORTABLE CHEST 1 VIEW COMPARISON:  12/23/2017 FINDINGS: Cardiomegaly. Bibasilar atelectasis. No effusions. No acute bony abnormality. No edema. IMPRESSION: Cardiomegaly.  Bibasilar atelectasis. Electronically Signed   By: Rolm Baptise M.D.   On: 11/28/2018 02:02    Micro Results     Recent Results (from the past 240 hour(s))  SARS Coronavirus 2 [facility] order, Performed in [facility] hospital lab) Nasopharyngeal Nasopharyngeal Swab     Status: None   Collection Time: 11/28/18  1:45 AM   Specimen: Nasopharyngeal Swab  Result Value Ref Range Status   SARS Coronavirus 2 NEGATIVE NEGATIVE Final    Comment: (NOTE) If result is NEGATIVE SARS-CoV-2 target nucleic acids are NOT DETECTED. The SARS-CoV-2 RNA is generally detectable in upper and lower  respiratory specimens during the acute phase of infection. The lowest  concentration of SARS-CoV-2 viral copies this assay can detect is 250  copies / mL. A negative result does not preclude SARS-CoV-2 infection  and should not be used as the sole basis for treatment or other  patient management decisions.  A negative result may occur with  improper specimen collection / handling, submission of specimen other  than nasopharyngeal swab, presence of viral mutation(s) within the  areas targeted by this assay, and inadequate number of viral copies  (<250 copies / mL). A negative result must be combined with clinical  observations, patient history, and epidemiological information. If result is POSITIVE SARS-CoV-2 target nucleic acids are DETECTED. The SARS-CoV-2 RNA is generally detectable in upper and lower  respiratory specimens dur ing the acute phase of infection.  Positive  results are indicative of active infection with SARS-CoV-2.  Clinical   correlation with patient history and other diagnostic information is  necessary to determine patient infection status.  Positive results do  not rule out bacterial infection or co-infection with other viruses. If result is PRESUMPTIVE POSTIVE SARS-CoV-2 nucleic acids MAY BE PRESENT.   A presumptive positive result was obtained on the submitted specimen  and confirmed on repeat testing.  While 2019 novel coronavirus  (SARS-CoV-2) nucleic acids may be present in the submitted sample  additional confirmatory testing may be necessary for epidemiological  and / or clinical management purposes  to differentiate between  SARS-CoV-2 and other Sarbecovirus currently known to infect humans.  If clinically indicated additional testing with an alternate test  methodology [phone]) is advised. The SARS-CoV-2 RNA is generally  detectable in upper and lower respiratory sp ecimens during the acute  phase of infection. The expected result is Negative. Fact Sheet for Patients:  StrictlyIdeas.no Fact Sheet for Healthcare Providers: BankingDealers.co.za This test is not yet approved or cleared by the [place] FDA and has been authorized for detection and/or diagnosis of SARS-CoV-2 by FDA under an Emergency Use Authorization (EUA).  This EUA will remain in effect (meaning this test can be used) for the duration of the COVID-19 declaration under Section 564(b)(1) of the Act, 21 U.S.C. section 360bbb-3(b)(1), unless the authorization is terminated or revoked sooner. Performed at [facility], [address] [place], [place] [address]        Today   Subjective:   [name] today no nausea, abdominal pain, eager to go home.   Objective:   Blood pressure (!) 156/95, pulse 77, temperature 98.7 F (37.1 C), temperature source Oral, resp. rate 18, height 5\' 4"  (1.626 m), weight 81.6 kg, SpO2 94 %.   Intake/Output Summary (Last 24  hours) at 11/28/2018 1517 Last data filed at 11/28/2018 1041 Gross per 24 hour  Intake 1000 ml  Output 400 ml  Net 600 ml    Exam Awake Alert, Oriented x 3, No new F.N deficits, Normal affect [place].AT,PERRAL Supple Neck,No JVD, No cervical lymphadenopathy appriciated.  Symmetrical Chest wall movement, Good air movement bilaterally, CTAB RRR,No Gallops,Rubs or new Murmurs, No Parasternal Heave +ve B.Sounds, Abd Soft, Non tender, No organomegaly appriciated, No rebound -guarding or rigidity. No Cyanosis, Clubbing or edema, No new Rash or bruise  Data Review   CBC w Diff:  Lab Results  Component Value Date   WBC 15.8 (H) 11/28/2018   HGB 10.3 (L) 11/28/2018   HCT 32.8 (L) 11/28/2018   PLT 498 (H) 11/28/2018   LYMPHOPCT 21 03/14/2018   MONOPCT 6 03/14/2018   EOSPCT 3 03/14/2018   BASOPCT 1 03/14/2018    CMP:  Lab Results  Component Value Date   NA 140 11/28/2018   K 3.7 11/28/2018   CL 104 11/28/2018   CO2 21 (L) 11/28/2018   BUN 8 11/28/2018   CREATININE 0.96 11/28/2018   PROT 8.5 (H) 11/28/2018  ALBUMIN 4.2 11/28/2018   BILITOT 0.4 11/28/2018   ALKPHOS 100 11/28/2018   AST 27 11/28/2018   ALT 15 11/28/2018  .   Total Time in preparing paper work, data evaluation and todays exam - 33 minutes  Epifanio Lesches M.D on 11/28/2018 at 3:17 PM    Note: This dictation was prepared with Dragon dictation along with smaller phrase technology. Any transcriptional errors that result from this process are unintentional.

## 2018-11-28 NOTE — ED Notes (Signed)
Pt transported to CT ?

## 2018-12-25 ENCOUNTER — Inpatient Hospital Stay (HOSPITAL_COMMUNITY)
Admission: EM | Admit: 2018-12-25 | Discharge: 2018-12-28 | DRG: 392 | Disposition: A | Discharge status: Home / Self Care | Payer: Medicare Other | Attending: Family Medicine | Admitting: Family Medicine

## 2018-12-25 ENCOUNTER — Emergency Department (HOSPITAL_COMMUNITY): Payer: Medicare Other

## 2018-12-25 ENCOUNTER — Encounter (HOSPITAL_COMMUNITY): Payer: Self-pay

## 2018-12-25 ENCOUNTER — Other Ambulatory Visit: Payer: Self-pay

## 2018-12-25 DIAGNOSIS — N179 Acute kidney failure, unspecified: Secondary | ICD-10-CM | POA: Diagnosis present

## 2018-12-25 DIAGNOSIS — R197 Diarrhea, unspecified: Secondary | ICD-10-CM | POA: Diagnosis present

## 2018-12-25 DIAGNOSIS — R1030 Lower abdominal pain, unspecified: Secondary | ICD-10-CM | POA: Diagnosis not present

## 2018-12-25 DIAGNOSIS — Z20828 Contact with and (suspected) exposure to other viral communicable diseases: Secondary | ICD-10-CM | POA: Diagnosis present

## 2018-12-25 DIAGNOSIS — K2950 Unspecified chronic gastritis without bleeding: Secondary | ICD-10-CM | POA: Diagnosis not present

## 2018-12-25 DIAGNOSIS — Z791 Long term (current) use of non-steroidal anti-inflammatories (NSAID): Secondary | ICD-10-CM

## 2018-12-25 DIAGNOSIS — R112 Nausea with vomiting, unspecified: Secondary | ICD-10-CM | POA: Diagnosis not present

## 2018-12-25 DIAGNOSIS — Z79899 Other long term (current) drug therapy: Secondary | ICD-10-CM

## 2018-12-25 DIAGNOSIS — F4310 Post-traumatic stress disorder, unspecified: Secondary | ICD-10-CM | POA: Diagnosis present

## 2018-12-25 DIAGNOSIS — E785 Hyperlipidemia, unspecified: Secondary | ICD-10-CM | POA: Diagnosis present

## 2018-12-25 DIAGNOSIS — M5030 Other cervical disc degeneration, unspecified cervical region: Secondary | ICD-10-CM | POA: Diagnosis present

## 2018-12-25 DIAGNOSIS — Z915 Personal history of self-harm: Secondary | ICD-10-CM

## 2018-12-25 DIAGNOSIS — D509 Iron deficiency anemia, unspecified: Secondary | ICD-10-CM | POA: Diagnosis present

## 2018-12-25 DIAGNOSIS — N809 Endometriosis, unspecified: Secondary | ICD-10-CM | POA: Diagnosis present

## 2018-12-25 DIAGNOSIS — K449 Diaphragmatic hernia without obstruction or gangrene: Secondary | ICD-10-CM | POA: Diagnosis present

## 2018-12-25 DIAGNOSIS — E876 Hypokalemia: Secondary | ICD-10-CM | POA: Diagnosis present

## 2018-12-25 DIAGNOSIS — I10 Essential (primary) hypertension: Secondary | ICD-10-CM | POA: Diagnosis present

## 2018-12-25 DIAGNOSIS — R1084 Generalized abdominal pain: Secondary | ICD-10-CM | POA: Diagnosis present

## 2018-12-25 DIAGNOSIS — F319 Bipolar disorder, unspecified: Secondary | ICD-10-CM | POA: Diagnosis present

## 2018-12-25 LAB — COMPREHENSIVE METABOLIC PANEL
ALT: 15 U/L (ref 0–44)
AST: 21 U/L (ref 15–41)
Albumin: 4.6 g/dL (ref 3.5–5.0)
Alkaline Phosphatase: 90 U/L (ref 38–126)
Anion gap: 17 — ABNORMAL HIGH (ref 5–15)
BUN: 14 mg/dL (ref 6–20)
CO2: 18 mmol/L — ABNORMAL LOW (ref 22–32)
Calcium: 9.6 mg/dL (ref 8.9–10.3)
Chloride: 106 mmol/L (ref 98–111)
Creatinine, Ser: 1.36 mg/dL — ABNORMAL HIGH (ref 0.44–1.00)
GFR calc Af Amer: 54 mL/min — ABNORMAL LOW (ref >60)
GFR calc non Af Amer: 46 mL/min — ABNORMAL LOW (ref >60)
Glucose, Bld: 158 mg/dL — ABNORMAL HIGH (ref 70–99)
Potassium: 3.6 mmol/L (ref 3.5–5.1)
Sodium: 141 mmol/L (ref 135–145)
Total Bilirubin: 0.6 mg/dL (ref 0.3–1.2)
Total Protein: 9.1 g/dL — ABNORMAL HIGH (ref 6.5–8.1)

## 2018-12-25 LAB — URINALYSIS, ROUTINE W REFLEX MICROSCOPIC
Bilirubin Urine: NEGATIVE
Glucose, UA: NEGATIVE mg/dL
Hgb urine dipstick: NEGATIVE
Ketones, ur: 20 mg/dL — AB
Nitrite: NEGATIVE
Protein, ur: 30 mg/dL — AB
Specific Gravity, Urine: 1.02 (ref 1.005–1.030)
pH: 5 (ref 5.0–8.0)

## 2018-12-25 LAB — CBC
HCT: 35.6 % — ABNORMAL LOW (ref 36.0–46.0)
Hemoglobin: 10.8 g/dL — ABNORMAL LOW (ref 12.0–15.0)
MCH: 24.8 pg — ABNORMAL LOW (ref 26.0–34.0)
MCHC: 30.3 g/dL (ref 30.0–36.0)
MCV: 81.8 fL (ref 80.0–100.0)
Platelets: 536 10*3/uL — ABNORMAL HIGH (ref 150–400)
RBC: 4.35 MIL/uL (ref 3.87–5.11)
RDW: 16.1 % — ABNORMAL HIGH (ref 11.5–15.5)
WBC: 14.7 10*3/uL — ABNORMAL HIGH (ref 4.0–10.5)
nRBC: 0 % (ref 0.0–0.2)

## 2018-12-25 LAB — LIPASE, BLOOD: Lipase: 26 U/L (ref 11–51)

## 2018-12-25 LAB — I-STAT BETA HCG BLOOD, ED (MC, WL, AP ONLY): I-stat hCG, quantitative: <5 m[IU]/mL (ref <5)

## 2018-12-25 MED ORDER — METOCLOPRAMIDE HCL 5 MG/ML IJ SOLN
10.0000 mg | Freq: Once | INTRAMUSCULAR | Status: AC
  Administered 2018-12-25: 23:00:00 10 mg via INTRAVENOUS
  Filled 2018-12-25: qty 2

## 2018-12-25 MED ORDER — PROMETHAZINE HCL 25 MG/ML IJ SOLN
12.5000 mg | Freq: Once | INTRAMUSCULAR | Status: AC
  Administered 2018-12-25: 12.5 mg via INTRAVENOUS
  Filled 2018-12-25: qty 1

## 2018-12-25 MED ORDER — DIPHENHYDRAMINE HCL 50 MG/ML IJ SOLN
25.0000 mg | Freq: Once | INTRAMUSCULAR | Status: AC
  Administered 2018-12-25: 23:00:00 25 mg via INTRAVENOUS
  Filled 2018-12-25: qty 1

## 2018-12-25 MED ORDER — SODIUM CHLORIDE 0.9 % IV BOLUS
1000.0000 mL | Freq: Once | INTRAVENOUS | Status: AC
  Administered 2018-12-25: 1000 mL via INTRAVENOUS

## 2018-12-25 MED ORDER — SODIUM CHLORIDE 0.9% FLUSH: 3.0000 mL | Freq: Once | INTRAVENOUS | Status: DC

## 2018-12-25 MED ORDER — SODIUM CHLORIDE 0.9 % IV BOLUS
1000.0000 mL | Freq: Once | INTRAVENOUS | Status: AC
  Administered 2018-12-26: 1000 mL via INTRAVENOUS

## 2018-12-25 MED ORDER — MORPHINE SULFATE (PF) 4 MG/ML IV SOLN
4.0000 mg | Freq: Once | INTRAVENOUS | Status: AC
  Administered 2018-12-25: 4 mg via INTRAVENOUS
  Filled 2018-12-25: qty 1

## 2018-12-25 MED ORDER — FAMOTIDINE IN NACL 20-0.9 MG/50ML-% IV SOLN
20.0000 mg | Freq: Once | INTRAVENOUS | Status: AC
  Administered 2018-12-25: 23:00:00 20 mg via INTRAVENOUS
  Filled 2018-12-25: qty 50

## 2018-12-25 NOTE — ED Provider Notes (Signed)
[facility] DEPT Provider Note   CSN: [id number] Arrival date & time: 12/25/18  1441     History   Chief Complaint Chief Complaint  Patient presents with  . Emesis    HPI [name] is a 48 y.o. female with a hx of anxiety, depression, HTN, hyperlipidemia, endometriosis, c.diff, intractable N/V, and drug seeking behavior who presents to the emergency department with complaints of nausea, vomiting, and abdominal pain that began at 4:00 this morning.  Patient states that she was feeling generally not well yesterday prior to going to bed, she woke from sleep at 4 AM with nausea and vomiting.  She states she has had approximately 25 episodes of emesis today each of which has been non-bloody and nonbilious.  She states that with her nausea and vomiting she has developed generalized abdominal pain that is worse on the left side.  She is also had one episode of nonbloody diarrhea.  Her symptoms have been constant, no alleviating or aggravating factors, she tried oral Zofran and Phenergan without relief around 1:00 this afternoon.  She has had some chills, nasal congestion, and a dry cough as well.  Denies fever, hematemesis, melena, hematochezia, constipation, shortness of breath, chest pain, syncope, or dysuria.  She states her symptoms are overall similar to her last hospital admission occluding the location/quality of her abdominal pain however it is a bit worse this time.  Per chart review: Patient had recent hospital admission 11/28/18 for intractable nausea and vomiting with abdominal pain that was suspected to be secondary to acute gastritis/psychogenic vomiting, during her admission stay she received IV hydration, IV nausea medicines (zoran, phenergan), and PPI, was discharged home same day.  She had CT abdomen/pelvis at that time that showed no acute finding explanation for her pain.    HPI  Past Medical History:  Diagnosis Date  . Anxiety   . Arthritis     "joints ache all over" (10/15/2014)  . Bulging lumbar disc   . Chronic lower back pain   . DDD (degenerative disc disease), cervical   . Depression   . Drug-seeking behavior   . Headache    "weekly" (10/15/2014)  . Hyperlipemia   . Hypertension   . PTSD (post-traumatic stress disorder)   . Skin cancer    "had them cut off my arms; don't know what kind"    Patient Active Problem List   Diagnosis Date Noted  . Intractable nausea and vomiting 11/28/2018  . Bipolar affective disorder ([place]) 11/28/2018  . Acute gastroenteritis 11/19/2015  . C. difficile colitis   . SIRS (systemic inflammatory response syndrome) ([place]) 01/11/2015  . Cellulitis 01/03/2015  . Opiate withdrawal ([place]) 12/18/2014  . Herpes simplex virus type 1 (HSV-1) dermatitis   . Sacral fracture ([place]) 12/12/2014  . Leukocytosis 12/12/2014  . Chronic lower back pain 12/12/2014  . Sacral fracture, closed ([place]) 12/12/2014  . Nausea with vomiting   . Intractable abdominal pain 10/15/2014  . Abdominal pain 10/15/2014  . Hypokalemia 09/04/2014  . Sepsis ([place]) 09/01/2014  . Abdominal pain, generalized 09/01/2014  . PTSD (post-traumatic stress disorder) 09/01/2014  . Endometriosis 09/01/2014  . Sinus tachycardia 09/01/2014  . Lactic acidosis 09/01/2014  . Severe sepsis ([place]) 09/01/2014    Past Surgical History:  Procedure Laterality Date  . ABLATION ON ENDOMETRIOSIS  2008  . HEMORRHOID SURGERY  ~ 2002     OB History   No obstetric history on file.     Home Medications  Prior to Admission medications   Medication Sig Start Date End Date Taking? Authorizing Provider  FLUoxetine (PROZAC) 40 MG capsule Take 40 mg by mouth daily.  06/26/17  Yes [provider]  hydrochlorothiazide (HYDRODIURIL) 25 MG tablet Take 25 mg by mouth daily.  06/26/17  Yes [provider]  ibuprofen (ADVIL,MOTRIN) 200 MG tablet Take 400 mg by mouth every 6 (six) hours as needed for moderate pain.   Yes [provider]  lamoTRIgine (LAMICTAL) 150 MG tablet Take 150 mg by mouth 2 (two) times daily.   Yes [provider]  lisinopril (ZESTRIL) 10 MG tablet Take 10 mg by mouth daily.    Yes [provider]  omeprazole (PRILOSEC) 20 MG capsule Take 20 mg by mouth daily.   Yes [provider]  ondansetron (ZOFRAN ODT) 4 MG disintegrating tablet Take 1 tablet (4 mg total) by mouth every 8 (eight) hours as needed for nausea or vomiting. 11/28/18  Yes Epifanio Lesches, MD  risperiDONE (RISPERDAL) 0.5 MG tablet Take 0.5 mg by mouth 2 (two) times daily. 03/03/18  Yes [provider]  tiZANidine (ZANAFLEX) 4 MG tablet Take 8 mg by mouth 3 (three) times daily.    Yes [provider]  traZODone (DESYREL) 150 MG tablet Take 150 mg by mouth at bedtime.  06/03/17  Yes [provider]  promethazine (PHENERGAN) 25 MG tablet Take 1 tablet (25 mg total) by mouth every 8 (eight) hours as needed for nausea or vomiting. Patient not taking: Reported on 11/28/2018 12/18/16   Menshew, Dannielle Karvonen, PA-C    Family History No family history on file.  Social History Social History   Tobacco Use  . Smoking status: Never Smoker  . Smokeless tobacco: Never Used  Substance Use Topics  . Alcohol use: Yes    Comment: 10/15/2014 "I've drank before; nothing regular; don't drink now cause of RX I'm on"  . Drug use: No     Allergies   Lactose intolerance (gi)   Review of Systems Review of Systems  Constitutional: Positive for chills. Negative for fever.  HENT: Positive for congestion. Negative for ear pain, sore throat and voice change.   Respiratory: Positive for cough. Negative for shortness of breath.   Cardiovascular: Negative for chest pain.  Gastrointestinal: Positive for abdominal pain, diarrhea, nausea and vomiting. Negative for anal bleeding, blood in stool and constipation.  Genitourinary: Negative for dysuria.  Neurological: Negative for syncope.  All  other systems reviewed and are negative.    Physical Exam Updated Vital Signs BP (!) 144/87 (BP Location: Left Arm)   Pulse 85   Temp 99.5 F (37.5 C)   Resp 20   Wt 88.8 kg   SpO2 100%   BMI 33.61 kg/m   Physical Exam Vitals signs and nursing note reviewed.  Constitutional:      General: She is not in acute distress.    Appearance: She is well-developed. She is not toxic-appearing.  HENT:     Head: Normocephalic and atraumatic.     Nose: Nose normal.     Mouth/Throat:     Mouth: Mucous membranes are dry.  Eyes:     General:        Right eye: No discharge.        Left eye: No discharge.     Conjunctiva/sclera: Conjunctivae normal.  Neck:     Musculoskeletal: Neck supple.  Cardiovascular:     Rate and Rhythm: Normal rate and regular rhythm.  Pulmonary:     Effort: Pulmonary effort is normal. No respiratory distress.     Breath sounds: Normal breath sounds. No wheezing, rhonchi or rales.  Abdominal:     General: Bowel sounds are normal. There is no distension.     Palpations: Abdomen is soft.     Tenderness: There is abdominal tenderness (generalized, L>R). There is no right CVA tenderness, left CVA tenderness, guarding or rebound.  Skin:    General: Skin is warm and dry.     Findings: No rash.  Neurological:     Mental Status: She is alert.     Comments: Clear speech.   Psychiatric:        Behavior: Behavior normal.    ED Treatments / Results  Labs (all labs ordered are listed, but only abnormal results are displayed) Labs Reviewed  COMPREHENSIVE METABOLIC PANEL - Abnormal; Notable for the following components:      Result Value   CO2 18 (*)    Glucose, Bld 158 (*)    Creatinine, Ser 1.36 (*)    Total Protein 9.1 (*)    GFR calc non Af Amer 46 (*)    GFR calc Af Amer 54 (*)    Anion gap 17 (*)    All other components within normal limits  CBC - Abnormal; Notable for the following components:   WBC 14.7 (*)    Hemoglobin 10.8 (*)    HCT 35.6 (*)     MCH 24.8 (*)    RDW 16.1 (*)    Platelets 536 (*)    All other components within normal limits  URINALYSIS, ROUTINE W REFLEX MICROSCOPIC - Abnormal; Notable for the following components:   APPearance CLOUDY (*)    Ketones, ur 20 (*)    Protein, ur 30 (*)    Leukocytes,Ua SMALL (*)    Bacteria, UA RARE (*)    All other components within normal limits  SARS CORONAVIRUS 2 (HOSPITAL ORDER, [facility])  LIPASE, BLOOD  I-STAT BETA HCG BLOOD, ED ([facility], [facility], [facility] ONLY)    EKG EKG Interpretation  Date/Time:  Thursday December 25 2018 23:37:34 EDT Ventricular Rate:  83 PR Interval:    QRS Duration: 97 QT Interval:  381 QTC Calculation: 448 R Axis:   13 Text Interpretation:  Sinus rhythm Anteroseptal infarct, age indeterminate Baseline wander in lead(s) I II aVR Confirmed by Ripley Fraise [phone]) on 12/26/2018 12:04:44 AM   Radiology Dg Abd Acute W/chest  Result Date: 12/25/2018 CLINICAL DATA:  48 year old presenting with acute onset of nausea and vomiting associated with LOWER abdominal pain that began this morning. EXAM: DG ABDOMEN ACUTE W/ 1V CHEST COMPARISON:  CT abdomen and pelvis 11/28/2018. Chest x-ray 11/28/2018 and earlier. FINDINGS: Bowel gas pattern unremarkable without evidence of obstruction or significant ileus. No evidence of free air or significant air-fluid levels on the erect image. Expected stool burden in the colon. No visible opaque urinary tract calculi. Regional skeleton unremarkable. Suboptimal inspiration. Cardiomediastinal silhouette unremarkable and unchanged. Lungs clear. Bronchovascular markings normal. Pulmonary vascularity normal. No visible pleural effusions. No pneumothorax. IMPRESSION: 1. No acute abdominal abnormality. 2. Suboptimal inspiration. No acute cardiopulmonary disease. Electronically Signed   By: Evangeline Dakin M.D.   On: 12/25/2018 21:32    Procedures Procedures (including critical care time)  Medications Ordered in ED  Medications  sodium chloride flush (NS) 0.9 % injection 3 mL (has no administration in time range)  famotidine (PEPCID) IVPB 20 mg premix (0 mg  Intravenous Stopped 12/26/18 0017)  metoCLOPramide (REGLAN) injection 10 mg (10 mg Intravenous Given 12/25/18 2232)  diphenhydrAMINE (BENADRYL) injection 25 mg (25 mg Intravenous Given 12/25/18 2232)  sodium chloride 0.9 % bolus 1,000 mL (1,000 mLs Intravenous New Bag/Given 12/26/18 0040)  sodium chloride 0.9 % bolus 1,000 mL (1,000 mLs Intravenous New Bag/Given 12/25/18 2237)  morphine 4 MG/ML injection 4 mg (4 mg Intravenous Given 12/25/18 2353)  promethazine (PHENERGAN) injection 12.5 mg (12.5 mg Intravenous Given 12/25/18 2352)  ondansetron (ZOFRAN) injection 4 mg (4 mg Intravenous Given 12/26/18 0039)     Initial Impression / Assessment and Plan / ED Course  I have reviewed the triage vital signs and the nursing notes.  Pertinent labs & imaging results that were available during my care of the patient were reviewed by me and considered in my medical decision making (see chart for details).   Patient presents to the emergency department with complaints of nausea, vomiting, and abdominal pain.  Her initial tachycardia has normalized on my assessment, BP remains elevated, doubt HTN emergency.  Her mucous membranes are dry, she has generalized abdominal tenderness that is worse to the left side of the abdomen to then to the right, no peritoneal signs.   Work-up per triage team has been reviewed: CBC: Mild leukocytosis at 14.7, improved from last hospital admission.  Anemia consistent with baseline labs CMP: Anion gap elevation with acidosis.  Mild AKI with creatinine of 1.36-previously 0.96.  No significant electrolyte derangement despite her several episodes of emesis. Urinalysis: Consistent with UTI, contaminated, ketonuria present  Suspect ketoacidosis related to her numerous episodes of emesis. She had recent admission with similar presentations suspected to  be gastritis/psychogenic in etiology, CT abdomen/pelvis without acute findings at that time-no significant change in symptoms compared to last admission per the patient, abdomen without peritoneal signs, do not feel that repeat CT imaging is necessary currently.  Will hydrate with 2 L of normal saline, symptomatic management, acute chest abdominal series x-ray ordered to assess for cough as well as to rule out obvious obstruction which I have low suspicion for, additionally doubt cholecystitis, appendicitis, pancreatitis, or diverticulitis currently.   Chest/abdominal series xray: 1. No acute abdominal abnormality. 2. Suboptimal inspiration. No acute cardiopulmonary disease.   Difficult IV access--> ultimately nursing staff consulted IV team.  --> 2L fluids, pepcid, reglan & benadryl ordered.   23:30: RE-EVAL: Patient continues to have vomiting following above interventions, complaining of pain, will check EKG to evaluate QTc and place orders for morphine/phergan.  QTc 448.   00:35: RE-EVAL: Continued emesis, trial of zofran  01:07: RE-EVAL: Continued emesis, given multiple anti-emetics w/ continued vomiting with her suspected ketoacidosis will obtain covid swab & consult hospitalist service for admission.   Findings and plan of care discussed with supervising physician Dr. Maryan Rued  who is in agreement.   JENENE KAUFFMANN was evaluated in Emergency Department on 12/26/2018 for the symptoms described in the history of present illness. He/she was evaluated in the context of the global COVID-19 pandemic, which necessitated consideration that the patient might be at risk for infection with the SARS-CoV-2 virus that causes COVID-19. Institutional protocols and algorithms that pertain to the evaluation of patients at risk for COVID-19 are in a state of rapid change based on information released by regulatory bodies including the CDC and federal and state organizations. These policies and algorithms were  followed during the patient's care in the ED.   Final Clinical Impressions(s) / ED Diagnoses   Final diagnoses:  Intractable nausea and vomiting    ED Discharge Orders    None       Amaryllis Dyke, PA-C 12/26/18 0125    Blanchie Dessert, MD 12/29/18 1000

## 2018-12-25 NOTE — ED Notes (Signed)
Pt ambulated from triage to rm 24 with steady gait

## 2018-12-25 NOTE — ED Triage Notes (Signed)
Pt states she has had N/V and abd pain x 10 hours. Pt states pain in umbilical area and LLQ.  Emesis 10-12x. Pt states no relief with PO phenergan or zofran. Pt endorses loose stool last night as well.

## 2018-12-25 NOTE — ED Notes (Signed)
Sammy, PA asked for EKG. EKG completed and given to Christy Gentles, MD

## 2018-12-26 ENCOUNTER — Encounter (HOSPITAL_COMMUNITY): Payer: Self-pay | Admitting: Internal Medicine

## 2018-12-26 ENCOUNTER — Observation Stay (HOSPITAL_COMMUNITY): Payer: Medicare Other

## 2018-12-26 ENCOUNTER — Other Ambulatory Visit: Payer: Self-pay

## 2018-12-26 DIAGNOSIS — N809 Endometriosis, unspecified: Secondary | ICD-10-CM | POA: Diagnosis not present

## 2018-12-26 DIAGNOSIS — Z20828 Contact with and (suspected) exposure to other viral communicable diseases: Secondary | ICD-10-CM | POA: Diagnosis not present

## 2018-12-26 DIAGNOSIS — K2950 Unspecified chronic gastritis without bleeding: Secondary | ICD-10-CM | POA: Diagnosis not present

## 2018-12-26 DIAGNOSIS — F4310 Post-traumatic stress disorder, unspecified: Secondary | ICD-10-CM | POA: Diagnosis not present

## 2018-12-26 DIAGNOSIS — I10 Essential (primary) hypertension: Secondary | ICD-10-CM | POA: Diagnosis present

## 2018-12-26 DIAGNOSIS — M5030 Other cervical disc degeneration, unspecified cervical region: Secondary | ICD-10-CM | POA: Diagnosis not present

## 2018-12-26 DIAGNOSIS — R1032 Left lower quadrant pain: Secondary | ICD-10-CM | POA: Diagnosis not present

## 2018-12-26 DIAGNOSIS — N179 Acute kidney failure, unspecified: Secondary | ICD-10-CM | POA: Diagnosis not present

## 2018-12-26 DIAGNOSIS — R112 Nausea with vomiting, unspecified: Secondary | ICD-10-CM | POA: Diagnosis present

## 2018-12-26 DIAGNOSIS — R1110 Vomiting, unspecified: Secondary | ICD-10-CM | POA: Diagnosis not present

## 2018-12-26 DIAGNOSIS — D509 Iron deficiency anemia, unspecified: Secondary | ICD-10-CM | POA: Diagnosis not present

## 2018-12-26 DIAGNOSIS — R1084 Generalized abdominal pain: Secondary | ICD-10-CM | POA: Diagnosis not present

## 2018-12-26 DIAGNOSIS — R197 Diarrhea, unspecified: Secondary | ICD-10-CM | POA: Diagnosis not present

## 2018-12-26 DIAGNOSIS — K449 Diaphragmatic hernia without obstruction or gangrene: Secondary | ICD-10-CM | POA: Diagnosis not present

## 2018-12-26 DIAGNOSIS — F319 Bipolar disorder, unspecified: Secondary | ICD-10-CM | POA: Diagnosis not present

## 2018-12-26 DIAGNOSIS — E785 Hyperlipidemia, unspecified: Secondary | ICD-10-CM | POA: Diagnosis not present

## 2018-12-26 LAB — CBC WITH DIFFERENTIAL/PLATELET
Abs Immature Granulocytes: 0.1 10*3/uL — ABNORMAL HIGH (ref 0.00–0.07)
Basophils Absolute: 0.1 10*3/uL (ref 0.0–0.1)
Basophils Relative: 0 %
Eosinophils Absolute: 0 10*3/uL (ref 0.0–0.5)
Eosinophils Relative: 0 %
HCT: 29.9 % — ABNORMAL LOW (ref 36.0–46.0)
Hemoglobin: 8.9 g/dL — ABNORMAL LOW (ref 12.0–15.0)
Immature Granulocytes: 1 %
Lymphocytes Relative: 11 %
Lymphs Abs: 1.4 10*3/uL (ref 0.7–4.0)
MCH: 24.8 pg — ABNORMAL LOW (ref 26.0–34.0)
MCHC: 29.8 g/dL — ABNORMAL LOW (ref 30.0–36.0)
MCV: 83.3 fL (ref 80.0–100.0)
Monocytes Absolute: 1.4 10*3/uL — ABNORMAL HIGH (ref 0.1–1.0)
Monocytes Relative: 10 %
Neutro Abs: 10.1 10*3/uL — ABNORMAL HIGH (ref 1.7–7.7)
Neutrophils Relative %: 78 %
Platelets: 402 10*3/uL — ABNORMAL HIGH (ref 150–400)
RBC: 3.59 MIL/uL — ABNORMAL LOW (ref 3.87–5.11)
RDW: 16.3 % — ABNORMAL HIGH (ref 11.5–15.5)
WBC: 13.1 10*3/uL — ABNORMAL HIGH (ref 4.0–10.5)
nRBC: 0 % (ref 0.0–0.2)

## 2018-12-26 LAB — COMPREHENSIVE METABOLIC PANEL
ALT: 13 U/L (ref 0–44)
AST: 15 U/L (ref 15–41)
Albumin: 3.9 g/dL (ref 3.5–5.0)
Alkaline Phosphatase: 66 U/L (ref 38–126)
Anion gap: 9 (ref 5–15)
BUN: 10 mg/dL (ref 6–20)
CO2: 25 mmol/L (ref 22–32)
Calcium: 8.6 mg/dL — ABNORMAL LOW (ref 8.9–10.3)
Chloride: 107 mmol/L (ref 98–111)
Creatinine, Ser: 0.9 mg/dL (ref 0.44–1.00)
GFR calc Af Amer: >60 mL/min (ref >60)
GFR calc non Af Amer: >60 mL/min (ref >60)
Glucose, Bld: 108 mg/dL — ABNORMAL HIGH (ref 70–99)
Potassium: 2.9 mmol/L — ABNORMAL LOW (ref 3.5–5.1)
Sodium: 141 mmol/L (ref 135–145)
Total Bilirubin: 0.5 mg/dL (ref 0.3–1.2)
Total Protein: 7.7 g/dL (ref 6.5–8.1)

## 2018-12-26 LAB — RAPID URINE DRUG SCREEN, HOSP PERFORMED
Amphetamines: POSITIVE — AB
Barbiturates: NOT DETECTED
Benzodiazepines: NOT DETECTED
Cocaine: NOT DETECTED
Opiates: NOT DETECTED
Tetrahydrocannabinol: NOT DETECTED

## 2018-12-26 LAB — SARS CORONAVIRUS 2 BY RT PCR (HOSPITAL ORDER, PERFORMED IN ~~LOC~~ HOSPITAL LAB): SARS Coronavirus 2: NEGATIVE

## 2018-12-26 MED ORDER — SODIUM CHLORIDE (PF) 0.9 % IJ SOLN
INTRAMUSCULAR | Status: AC
  Filled 2018-12-26: qty 50

## 2018-12-26 MED ORDER — MORPHINE SULFATE (PF) 2 MG/ML IV SOLN
1.0000 mg | INTRAVENOUS | Status: DC | PRN
  Administered 2018-12-26 – 2018-12-28 (×7): 1 mg via INTRAVENOUS
  Filled 2018-12-26 (×7): qty 1

## 2018-12-26 MED ORDER — LAMOTRIGINE 25 MG PO TABS
150.0000 mg | ORAL_TABLET | Freq: Two times a day (BID) | ORAL | Status: DC
  Administered 2018-12-26 – 2018-12-28 (×4): 150 mg via ORAL
  Filled 2018-12-26 (×3): qty 2
  Filled 2018-12-26: qty 1

## 2018-12-26 MED ORDER — SODIUM CHLORIDE 0.9 % IV SOLN
INTRAVENOUS | Status: DC
  Administered 2018-12-26 – 2018-12-27 (×6): via INTRAVENOUS

## 2018-12-26 MED ORDER — ONDANSETRON HCL 4 MG/2ML IJ SOLN
4.0000 mg | Freq: Four times a day (QID) | INTRAMUSCULAR | Status: DC | PRN
  Administered 2018-12-26 – 2018-12-27 (×3): 4 mg via INTRAVENOUS
  Filled 2018-12-26 (×2): qty 2

## 2018-12-26 MED ORDER — MORPHINE SULFATE (PF) 2 MG/ML IV SOLN
2.0000 mg | Freq: Once | INTRAVENOUS | Status: AC
  Administered 2018-12-26: 2 mg via INTRAVENOUS
  Filled 2018-12-26: qty 1

## 2018-12-26 MED ORDER — PANTOPRAZOLE SODIUM 40 MG IV SOLR
40.0000 mg | Freq: Two times a day (BID) | INTRAVENOUS | Status: DC
  Administered 2018-12-26 – 2018-12-28 (×4): 40 mg via INTRAVENOUS
  Filled 2018-12-26 (×4): qty 40

## 2018-12-26 MED ORDER — TRAZODONE HCL 50 MG PO TABS
150.0000 mg | ORAL_TABLET | Freq: Every day | ORAL | Status: DC
  Administered 2018-12-26 – 2018-12-27 (×2): 150 mg via ORAL
  Filled 2018-12-26 (×2): qty 1

## 2018-12-26 MED ORDER — DEXTROSE-NACL 5-0.9 % IV SOLN
INTRAVENOUS | Status: DC
  Administered 2018-12-26: 04:00:00 via INTRAVENOUS

## 2018-12-26 MED ORDER — MORPHINE SULFATE (PF) 2 MG/ML IV SOLN
2.0000 mg | Freq: Once | INTRAVENOUS | Status: DC
  Filled 2018-12-26: qty 1

## 2018-12-26 MED ORDER — ONDANSETRON HCL 4 MG PO TABS: 4.0000 mg | ORAL_TABLET | Freq: Four times a day (QID) | ORAL | Status: DC | PRN

## 2018-12-26 MED ORDER — IOHEXOL 300 MG/ML  SOLN
100.0000 mL | Freq: Once | INTRAMUSCULAR | Status: AC | PRN
  Administered 2018-12-26: 80 mL via INTRAVENOUS

## 2018-12-26 MED ORDER — ACETAMINOPHEN 650 MG RE SUPP
650.0000 mg | Freq: Four times a day (QID) | RECTAL | Status: DC | PRN
  Administered 2018-12-27: 08:00:00 650 mg via RECTAL
  Filled 2018-12-26: qty 1

## 2018-12-26 MED ORDER — TIZANIDINE HCL 4 MG PO TABS
8.0000 mg | ORAL_TABLET | Freq: Three times a day (TID) | ORAL | Status: DC
  Administered 2018-12-26 – 2018-12-28 (×6): 8 mg via ORAL
  Filled 2018-12-26 (×6): qty 2

## 2018-12-26 MED ORDER — ACETAMINOPHEN 325 MG PO TABS: 650.0000 mg | ORAL_TABLET | Freq: Four times a day (QID) | ORAL | Status: DC | PRN

## 2018-12-26 MED ORDER — FLUOXETINE HCL 20 MG PO CAPS
40.0000 mg | ORAL_CAPSULE | Freq: Every day | ORAL | Status: DC
  Administered 2018-12-26 – 2018-12-28 (×3): 40 mg via ORAL
  Filled 2018-12-26 (×3): qty 2

## 2018-12-26 MED ORDER — ONDANSETRON HCL 4 MG/2ML IJ SOLN
4.0000 mg | Freq: Four times a day (QID) | INTRAMUSCULAR | Status: DC
  Administered 2018-12-26 – 2018-12-28 (×6): 4 mg via INTRAVENOUS
  Filled 2018-12-26 (×6): qty 2

## 2018-12-26 MED ORDER — ENOXAPARIN SODIUM 40 MG/0.4ML ~~LOC~~ SOLN
40.0000 mg | SUBCUTANEOUS | Status: DC
  Administered 2018-12-26 – 2018-12-27 (×2): 40 mg via SUBCUTANEOUS
  Filled 2018-12-26 (×2): qty 0.4

## 2018-12-26 MED ORDER — SODIUM CHLORIDE 0.9 % IV BOLUS
500.0000 mL | Freq: Once | INTRAVENOUS | Status: AC
  Administered 2018-12-26: 16:00:00 500 mL via INTRAVENOUS

## 2018-12-26 MED ORDER — ONDANSETRON HCL 4 MG/2ML IJ SOLN
4.0000 mg | Freq: Once | INTRAMUSCULAR | Status: AC
  Administered 2018-12-26: 4 mg via INTRAVENOUS
  Filled 2018-12-26: qty 2

## 2018-12-26 MED ORDER — HYDRALAZINE HCL 20 MG/ML IJ SOLN
10.0000 mg | INTRAMUSCULAR | Status: DC | PRN
  Administered 2018-12-27: 12:00:00 10 mg via INTRAVENOUS
  Filled 2018-12-26: qty 1

## 2018-12-26 MED ORDER — RISPERIDONE 0.25 MG PO TABS
0.5000 mg | ORAL_TABLET | Freq: Two times a day (BID) | ORAL | Status: DC
  Administered 2018-12-26 – 2018-12-28 (×4): 0.5 mg via ORAL
  Filled 2018-12-26 (×4): qty 2

## 2018-12-26 MED ORDER — POTASSIUM CHLORIDE 10 MEQ/100ML IV SOLN
10.0000 meq | INTRAVENOUS | Status: AC
  Administered 2018-12-26 (×4): 10 meq via INTRAVENOUS
  Filled 2018-12-26 (×2): qty 100

## 2018-12-26 NOTE — Progress Notes (Signed)
Subjective: Patient admitted this morning, see detailed H&P by Dr Hal Hope 48 year old female with a history of hypertension, depression, previous history of endometriosis was admitted a month ago at [facility] with intractable nausea vomiting.  CT scan at that time showed diaphragmatic hernia.  Patient again came to the hospital with intractable nausea vomiting.  Repeat CT scan today also shows [place] diaphragmatic hernia with gastric fundus   Vitals:   12/26/18 1000 12/26/18 1100  BP: 140/79 (!) 152/81  Pulse:  77  Resp: 12 18  Temp:  98.4 F (36.9 C)  SpO2:  99%      A/P Diaphragmatic hernia-patient's intractable nausea vomiting likely from the diaphragmatic hernia.  She has persistent symptoms with left upper quadrant tenderness to palpation.  Will obtain surgical evaluation for diaphragmatic hernia repair.    [place] Hospitalist [phone]

## 2018-12-26 NOTE — ED Notes (Signed)
Pt requesting foley catheter because she doesn't want to get up "frequently" to go to the BR. Mortimer Fries, RN aware.

## 2018-12-26 NOTE — H&P (Signed)
History and Physical    [name] GYI:[id number] DOB: [birth year] DOA: 12/25/2018  PCP: Andreas Blower, MD  Patient coming from: Home.  Chief Complaint: Nausea vomiting.  HPI: [name] is a 48 y.o. female with history of hypertension, depression previous history of endometriosis was admitted on over 7 last month at [facility] for intractable nausea vomiting at that time CT scan of the abdomen did not show any acute except for diaphragmatic hernia presents to the ER with complaints of intractable nausea vomiting started off yesterday.  Denies any blood in the vomitus.  Also has been neck some diarrhea.  Denies any sick contacts or recent use of antibiotics.  Patient has been having some left lower quadrant abdominal discomfort.  ED Course: In the ER on exam patient abdomen is not rigid or any rebound tenderness seen.  Acute abdominal series unremarkable.  Patient is afebrile.  COVID-19 test is pending.  Labs show mild leukocytosis of 14.7 creatinine increased to 1.3 bicarb 18 hemoglobin 10.8 UA does not shows a lot of squamous cells with RBC WBC and ketones and leukocyte esterase.  Urine drug screen is positive for amphetamine.  EKG shows normal sinus rhythm.  Since patient has been having multiple episodes of vomiting will admit for further observation and hydration.  Review of Systems: As per HPI, rest all negative.   Past Medical History:  Diagnosis Date  . Anxiety   . Arthritis    "joints ache all over" (10/15/2014)  . Bulging lumbar disc   . Chronic lower back pain   . DDD (degenerative disc disease), cervical   . Depression   . Drug-seeking behavior   . Headache    "weekly" (10/15/2014)  . Hyperlipemia   . Hypertension   . PTSD (post-traumatic stress disorder)   . Skin cancer    "had them cut off my arms; don't know what kind"    Past Surgical History:  Procedure Laterality Date  . ABLATION ON ENDOMETRIOSIS  2008  . HEMORRHOID  SURGERY  ~ 2002     reports that she has never smoked. She has never used smokeless tobacco. She reports current alcohol use. She reports that she does not use drugs.  Allergies  Allergen Reactions  . Lactose Intolerance (Gi) Nausea Only    History reviewed. No pertinent family history.  Prior to Admission medications   Medication Sig Start Date End Date Taking? Authorizing Provider  FLUoxetine (PROZAC) 40 MG capsule Take 40 mg by mouth daily.  06/26/17  Yes [provider]  hydrochlorothiazide (HYDRODIURIL) 25 MG tablet Take 25 mg by mouth daily.  06/26/17  Yes [provider]  ibuprofen (ADVIL,MOTRIN) 200 MG tablet Take 400 mg by mouth every 6 (six) hours as needed for moderate pain.   Yes [provider]  lamoTRIgine (LAMICTAL) 150 MG tablet Take 150 mg by mouth 2 (two) times daily.   Yes [provider]  lisinopril (ZESTRIL) 10 MG tablet Take 10 mg by mouth daily.    Yes [provider]  omeprazole (PRILOSEC) 20 MG capsule Take 20 mg by mouth daily.   Yes [provider]  ondansetron (ZOFRAN ODT) 4 MG disintegrating tablet Take 1 tablet (4 mg total) by mouth every 8 (eight) hours as needed for nausea or vomiting. 11/28/18  Yes Epifanio Lesches, MD  risperiDONE (RISPERDAL) 0.5 MG tablet Take 0.5 mg by mouth 2 (two) times daily. 03/03/18  Yes [provider]  tiZANidine (ZANAFLEX) 4 MG  tablet Take 8 mg by mouth 3 (three) times daily.    Yes [provider]  traZODone (DESYREL) 150 MG tablet Take 150 mg by mouth at bedtime.  06/03/17  Yes [provider]  promethazine (PHENERGAN) 25 MG tablet Take 1 tablet (25 mg total) by mouth every 8 (eight) hours as needed for nausea or vomiting. Patient not taking: Reported on 11/28/2018 12/18/16   Menshew, Dannielle Karvonen, PA-C    Physical Exam: Constitutional: Moderately built and nourished. Vitals:   12/26/18 0000 12/26/18 0030 12/26/18 0100 12/26/18 0227  BP: (!)  148/86 (!) 162/86 (!) 171/67 (!) 162/87  Pulse: (!) 102   80  Resp: (!) 9 18 16 19   Temp:    98.3 F (36.8 C)  TempSrc:    Oral  SpO2: 99%   96%  Weight:       Eyes: Anicteric no pallor. ENMT: No discharge from the ears eyes nose or mouth. Neck: No mass felt.  No neck rigidity. Respiratory: No rhonchi or crepitations. Cardiovascular: S1-S2 heard. Abdomen: Soft nontender bowel sounds present. Musculoskeletal: No edema. Skin: No rash. Neurologic: Alert awake oriented to time place and person.  Moves all extremities. Psychiatric: Appears normal per normal affect.   Labs on Admission: I have personally reviewed following labs and imaging studies  CBC: Recent Labs  Lab 12/25/18 1511  WBC 14.7*  HGB 10.8*  HCT 35.6*  MCV 81.8  PLT 329*   Basic Metabolic Panel: Recent Labs  Lab 12/25/18 1511  NA 141  K 3.6  CL 106  CO2 18*  GLUCOSE 158*  BUN 14  CREATININE 1.36*  CALCIUM 9.6   GFR: Estimated Creatinine Clearance: 55.1 mL/min (A) (by C-G formula based on SCr of 1.36 mg/dL (H)). Liver Function Tests: Recent Labs  Lab 12/25/18 1511  AST 21  ALT 15  ALKPHOS 90  BILITOT 0.6  PROT 9.1*  ALBUMIN 4.6   Recent Labs  Lab 12/25/18 1511  LIPASE 26   No results for input(s): AMMONIA in the last 168 hours. Coagulation Profile: No results for input(s): INR, PROTIME in the last 168 hours. Cardiac Enzymes: No results for input(s): CKTOTAL, CKMB, CKMBINDEX, TROPONINI in the last 168 hours. BNP (last 3 results) No results for input(s): PROBNP in the last 8760 hours. HbA1C: No results for input(s): HGBA1C in the last 72 hours. CBG: No results for input(s): GLUCAP in the last 168 hours. Lipid Profile: No results for input(s): CHOL, HDL, LDLCALC, TRIG, CHOLHDL, LDLDIRECT in the last 72 hours. Thyroid Function Tests: No results for input(s): TSH, T4TOTAL, FREET4, T3FREE, THYROIDAB in the last 72 hours. Anemia Panel: No results for input(s): VITAMINB12, FOLATE,  FERRITIN, TIBC, IRON, RETICCTPCT in the last 72 hours. Urine analysis:    Component Value Date/Time   COLORURINE YELLOW 12/25/2018 1830   APPEARANCEUR CLOUDY (A) 12/25/2018 1830   LABSPEC 1.020 12/25/2018 1830   PHURINE 5.0 12/25/2018 1830   GLUCOSEU NEGATIVE 12/25/2018 1830   HGBUR NEGATIVE 12/25/2018 1830   BILIRUBINUR NEGATIVE 12/25/2018 1830   KETONESUR 20 (A) 12/25/2018 1830   PROTEINUR 30 (A) 12/25/2018 1830   UROBILINOGEN 0.2 01/19/2015 0302   NITRITE NEGATIVE 12/25/2018 1830   LEUKOCYTESUR SMALL (A) 12/25/2018 1830   Sepsis Labs: @LABRCNTIP (procalcitonin:4,lacticidven:4) )No results found for this or any previous visit (from the past 240 hour(s)).   Radiological Exams on Admission: Dg Abd Acute W/chest  Result Date: 12/25/2018 CLINICAL DATA:  48 year old presenting with acute onset of nausea and vomiting associated with LOWER  abdominal pain that began this morning. EXAM: DG ABDOMEN ACUTE W/ 1V CHEST COMPARISON:  CT abdomen and pelvis 11/28/2018. Chest x-ray 11/28/2018 and earlier. FINDINGS: Bowel gas pattern unremarkable without evidence of obstruction or significant ileus. No evidence of free air or significant air-fluid levels on the erect image. Expected stool burden in the colon. No visible opaque urinary tract calculi. Regional skeleton unremarkable. Suboptimal inspiration. Cardiomediastinal silhouette unremarkable and unchanged. Lungs clear. Bronchovascular markings normal. Pulmonary vascularity normal. No visible pleural effusions. No pneumothorax. IMPRESSION: 1. No acute abdominal abnormality. 2. Suboptimal inspiration. No acute cardiopulmonary disease. Electronically Signed   By: Evangeline Dakin M.D.   On: 12/25/2018 21:32    EKG: Independently reviewed.  Normal sinus rhythm.  Assessment/Plan Principal Problem:   Nausea & vomiting Active Problems:   Abdominal pain, generalized   Endometriosis   Essential hypertension   ARF (acute renal failure) ([place])    1.  Intractable nausea vomiting with diarrhea and abdominal discomfort -pain is mostly in the left lower quadrant.  CT scan of the abdomen pelvis done last month was largely unremarkable except for diaphragmatic hernia.  Acute abdominal series is unremarkable today.  Patient does show some ketones in the urine and low bicarb in the blood work.  We will continue to hydrate and advance diet as tolerated.  May need GI work-up if patient has persistent vomiting.  Stool studies if patient has further diarrhea. 2. Acute renal failure from vomiting and diarrhea.  Hydrate and recheck metabolic panel hold lisinopril hydrochlorothiazide. 3. Hypertension we will hold lisinopril hydrochlorothiazide due to acute renal failure.  PRN IV hydralazine. 4. Anemia appears to be chronic follow CBC. 5. History of depression on fluoxetine Lamictal and trazodone.   DVT prophylaxis: Lovenox. Code Status: Full code. Family Communication: Discussed with patient. Disposition Plan: Home. Consults called: None. Admission status: Observation.   Rise Patience MD [facility] Pager [phone].  If 7PM-7AM, please contact night-coverage [email or web address] Password [email or web address]  12/26/2018, 3:08 AM

## 2018-12-26 NOTE — ED Notes (Signed)
Pt ambulated to BR with steady gait.

## 2018-12-26 NOTE — Anesthesia Preprocedure Evaluation (Addendum)
Anesthesia Evaluation  Patient identified by MRN, date of birth, ID band Patient awake    Reviewed: Allergy & Precautions, NPO status , Patient's Chart, lab work & pertinent test results  History of Anesthesia Complications Negative for: history of anesthetic complications  Airway Mallampati: I  TM Distance: >3 FB Neck ROM: Full    Dental  (+) Edentulous Upper, Edentulous Lower   Pulmonary neg pulmonary ROS,  12/26/2018 SARS coronavirus NEG   breath sounds clear to auscultation       Cardiovascular hypertension, Pt. on medications (-) angina Rhythm:Regular Rate:Normal  '16 ECHO: EF 55-60%, valves OK   Neuro/Psych  Headaches, Seizures - (last 2 years ago), Well Controlled,  Anxiety Depression Bipolar Disorder Chronic back pain    GI/Hepatic Neg liver ROS, GERD  Medicated and Controlled,N/V: not since Thursday   Endo/Other  obese  Renal/GU negative Renal ROS     Musculoskeletal   Abdominal (+) + obese,   Peds  Hematology  (+) Blood dyscrasia (Hb 8.9), anemia ,   Anesthesia Other Findings   Reproductive/Obstetrics                           Anesthesia Physical Anesthesia Plan  ASA: III  Anesthesia Plan: MAC   Post-op Pain Management:    Induction:   PONV Risk Score and Plan: Treatment may vary due to age or medical condition and Ondansetron  Airway Management Planned: Natural Airway and Nasal Cannula  Additional Equipment:   Intra-op Plan:   Post-operative Plan:   Informed Consent: I have reviewed the patients History and Physical, chart, labs and discussed the procedure including the risks, benefits and alternatives for the proposed anesthesia with the patient or authorized representative who has indicated his/her understanding and acceptance.       Plan Discussed with: CRNA and Surgeon  Anesthesia Plan Comments:        Anesthesia Quick Evaluation
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
## 2018-12-26 NOTE — Consult Note (Signed)
[facility] Surgery Consult Note  [name] [birth year]  [id number].    Requesting MD: Dr. Darrick Meigs  Chief Complaint/Reason for Consult: N/V  HPI: [name] is a 48 y.o. female who presented to the hospital with abdominal pain during the early hours of 9/4 was admitted for intractable nausea and vomiting.  Patient reports that yesterday afternoon she began having left lower quadrant abdominal pain, nausea as well as greater than 10 episodes of nonbilious emesis.  Her abdominal pain was gradual in onset, constant and worsened to a 10/10 piror to arrival to the ED. She underwent workup in the ED that showed a Bochdalek hernia at left diaphragm containing gastric fundus.  She reports that since admission her nausea is controlled and she is tolerating clear liquids.  Her pain has gone from a 10/10 to a now 3/10, with what she calls now a deep soreness in her LLQ.  She denies any upper abdominal pain.  She reports she has passed a small on a flatus and has had a loose bowel movement since symptom onset.  She has had similar symptoms in the past when she was admitted to [facility] on 8/7.  CT scan on 8/7 showed a Bochdalek's hernia of the left diaphragm that contains the gastric fundus.  She was treated for gastritis and discharged after being p.o. challenged. Prior surgical history includes ablation of endometriosis.  She is not on any blood thinners.  She denies any alcohol, nicotine or drug use, however her UDS is positive for amphetamines.   ROS: Review of Systems  Constitutional: Negative for chills and fever.  Respiratory: Negative for cough and shortness of breath.   Cardiovascular: Negative for chest pain.  Gastrointestinal: Positive for abdominal pain, nausea and vomiting. Negative for blood in stool, constipation and diarrhea.  Genitourinary: Negative for dysuria.  All systems reviewed and otherwise negative except for as above  History reviewed. No pertinent family history.  Past  Medical History:  Diagnosis Date  . Anxiety   . Arthritis    "joints ache all over" (10/15/2014)  . Bulging lumbar disc   . Chronic lower back pain   . DDD (degenerative disc disease), cervical   . Depression   . Drug-seeking behavior   . Headache    "weekly" (10/15/2014)  . Hyperlipemia   . Hypertension   . PTSD (post-traumatic stress disorder)   . Skin cancer    "had them cut off my arms; don't know what kind"    Past Surgical History:  Procedure Laterality Date  . ABLATION ON ENDOMETRIOSIS  2008  . HEMORRHOID SURGERY  ~ 2002    Social History:  reports that she has never smoked. She has never used smokeless tobacco. She reports current alcohol use. She reports that she does not use drugs.  Allergies:  Allergies  Allergen Reactions  . Lactose Intolerance (Gi) Nausea Only    Medications Prior to Admission  Medication Sig Dispense Refill  . FLUoxetine (PROZAC) 40 MG capsule Take 40 mg by mouth daily.     . hydrochlorothiazide (HYDRODIURIL) 25 MG tablet Take 25 mg by mouth daily.     Marland Kitchen ibuprofen (ADVIL,MOTRIN) 200 MG tablet Take 400 mg by mouth every 6 (six) hours as needed for moderate pain.    Marland Kitchen lamoTRIgine (LAMICTAL) 150 MG tablet Take 150 mg by mouth 2 (two) times daily.    Marland Kitchen lisinopril (ZESTRIL) 10 MG tablet Take 10 mg by mouth daily.     Marland Kitchen omeprazole (PRILOSEC) 20  MG capsule Take 20 mg by mouth daily.    . ondansetron (ZOFRAN ODT) 4 MG disintegrating tablet Take 1 tablet (4 mg total) by mouth every 8 (eight) hours as needed for nausea or vomiting. 20 tablet 0  . risperiDONE (RISPERDAL) 0.5 MG tablet Take 0.5 mg by mouth 2 (two) times daily.  2  . tiZANidine (ZANAFLEX) 4 MG tablet Take 8 mg by mouth 3 (three) times daily.     . traZODone (DESYREL) 150 MG tablet Take 150 mg by mouth at bedtime.     . promethazine (PHENERGAN) 25 MG tablet Take 1 tablet (25 mg total) by mouth every 8 (eight) hours as needed for nausea or vomiting. (Patient not taking: Reported on 11/28/2018)  12 tablet 0    Prior to Admission medications   Medication Sig Start Date End Date Taking? Authorizing Provider  FLUoxetine (PROZAC) 40 MG capsule Take 40 mg by mouth daily.  06/26/17  Yes [provider]  hydrochlorothiazide (HYDRODIURIL) 25 MG tablet Take 25 mg by mouth daily.  06/26/17  Yes [provider]  ibuprofen (ADVIL,MOTRIN) 200 MG tablet Take 400 mg by mouth every 6 (six) hours as needed for moderate pain.   Yes [provider]  lamoTRIgine (LAMICTAL) 150 MG tablet Take 150 mg by mouth 2 (two) times daily.   Yes [provider]  lisinopril (ZESTRIL) 10 MG tablet Take 10 mg by mouth daily.    Yes [provider]  omeprazole (PRILOSEC) 20 MG capsule Take 20 mg by mouth daily.   Yes [provider]  ondansetron (ZOFRAN ODT) 4 MG disintegrating tablet Take 1 tablet (4 mg total) by mouth every 8 (eight) hours as needed for nausea or vomiting. 11/28/18  Yes Epifanio Lesches, MD  risperiDONE (RISPERDAL) 0.5 MG tablet Take 0.5 mg by mouth 2 (two) times daily. 03/03/18  Yes [provider]  tiZANidine (ZANAFLEX) 4 MG tablet Take 8 mg by mouth 3 (three) times daily.    Yes [provider]  traZODone (DESYREL) 150 MG tablet Take 150 mg by mouth at bedtime.  06/03/17  Yes [provider]  promethazine (PHENERGAN) 25 MG tablet Take 1 tablet (25 mg total) by mouth every 8 (eight) hours as needed for nausea or vomiting. Patient not taking: Reported on 11/28/2018 12/18/16   Menshew, Dannielle Karvonen, PA-C    Blood pressure (!) 152/81, pulse 77, temperature 98.4 F (36.9 C), temperature source Oral, resp. rate 18, height 5\' 4"  (1.626 m), weight 88.9 kg, SpO2 99 %. Physical Exam: General: pleasant, WD/WN white female who is laying in bed in NAD HEENT: head is normocephalic, atraumatic.  Sclera are noninjected.  Pupils equal and round.  Ears and nose without any masses or lesions.  Mouth is pink and moist. Dentition fair Heart:  regular, rate, and rhythm.  No obvious murmurs, gallops, or rubs noted.  Palpable pedal pulses bilaterally Lungs: CTAB, no wheezes, rhonchi, or rales noted.  Respiratory effort nonlabored Abd: Soft, ND, essentially benign abdomen with very mild tenderness in the LLQ. +BS, no masses, hernias, or organomegaly MS: all 4 extremities are symmetrical with no cyanosis, clubbing, or edema. Skin: warm and dry with no masses, lesions, or rashes Psych: A&Ox3 with an appropriate affect. Neuro: cranial nerves grossly intact, extremity CSM intact bilaterally, normal speech  Results for orders placed or performed during the hospital encounter of 12/25/18 (from the past 48 hour(s))  Lipase, blood     Status: None   Collection Time: 12/25/18  3:11 PM  Result Value Ref Range   Lipase 26 11 - 51 U/L    Comment: Performed at [facility], [place] [address], [place], [place] [address]  Comprehensive metabolic panel     Status: Abnormal   Collection Time: 12/25/18  3:11 PM  Result Value Ref Range   Sodium 141 135 - 145 mmol/L   Potassium 3.6 3.5 - 5.1 mmol/L   Chloride 106 98 - 111 mmol/L   CO2 18 (L) 22 - 32 mmol/L   Glucose, Bld 158 (H) 70 - 99 mg/dL   BUN 14 6 - 20 mg/dL   Creatinine, Ser 1.36 (H) 0.44 - 1.00 mg/dL   Calcium 9.6 8.9 - 10.3 mg/dL   Total Protein 9.1 (H) 6.5 - 8.1 g/dL   Albumin 4.6 3.5 - 5.0 g/dL   AST 21 15 - 41 U/L   ALT 15 0 - 44 U/L   Alkaline Phosphatase 90 38 - 126 U/L   Total Bilirubin 0.6 0.3 - 1.2 mg/dL   GFR calc non Af Amer 46 (L) >60 mL/min   GFR calc Af Amer 54 (L) >60 mL/min   Anion gap 17 (H) 5 - 15    Comment: Performed at [facility], [place] [address], [place], [place] [address]  CBC     Status: Abnormal   Collection Time: 12/25/18  3:11 PM  Result Value Ref Range   WBC 14.7 (H) 4.0 - 10.5 K/uL   RBC 4.35 3.87 - 5.11 MIL/uL   Hemoglobin 10.8 (L) 12.0 - 15.0 g/dL   HCT 35.6 (L) 36.0 - 46.0 %   MCV 81.8 80.0 - 100.0 fL   MCH  24.8 (L) 26.0 - 34.0 pg   MCHC 30.3 30.0 - 36.0 g/dL   RDW 16.1 (H) 11.5 - 15.5 %   Platelets 536 (H) 150 - 400 K/uL   nRBC 0.0 0.0 - 0.2 %    Comment: Performed at [facility], [place] [address], [place], [place] [address]  I-Stat beta hCG blood, ED     Status: None   Collection Time: 12/25/18  3:30 PM  Result Value Ref Range   I-stat hCG, quantitative <5.0 <5 mIU/mL   Comment 3            Comment:   GEST. AGE      CONC.  (mIU/mL)   <=1 WEEK        5 - 50     2 WEEKS       50 - 500     3 WEEKS       100 - 10,000     4 WEEKS     1,000 - 30,000        FEMALE AND NON-PREGNANT FEMALE:     LESS THAN 5 mIU/mL   Urinalysis, Routine w reflex microscopic     Status: Abnormal   Collection Time: 12/25/18  6:30 PM  Result Value Ref Range   Color, Urine YELLOW YELLOW   APPearance CLOUDY (A) CLEAR   Specific Gravity, Urine 1.020 1.005 - 1.030   pH 5.0 5.0 - 8.0   Glucose, UA NEGATIVE NEGATIVE mg/dL   Hgb urine dipstick NEGATIVE NEGATIVE   Bilirubin Urine NEGATIVE NEGATIVE   Ketones, ur 20 (A) NEGATIVE mg/dL   Protein, ur 30 (A) NEGATIVE mg/dL   Nitrite NEGATIVE NEGATIVE   Leukocytes,Ua SMALL (A) NEGATIVE   RBC / HPF 6-10 0 - 5 RBC/hpf   WBC, UA  6-10 0 - 5 WBC/hpf   Bacteria, UA RARE (A) NONE SEEN   Squamous Epithelial / LPF 11-20 0 - 5   Mucus PRESENT    Hyaline Casts, UA PRESENT     Comment: Performed at [facility], [place] [address] [place], [place] [address]  Urine rapid drug screen (hosp performed)     Status: Abnormal   Collection Time: 12/25/18  6:30 PM  Result Value Ref Range   Opiates NONE DETECTED NONE DETECTED   Cocaine NONE DETECTED NONE DETECTED   Benzodiazepines NONE DETECTED NONE DETECTED   Amphetamines POSITIVE (A) NONE DETECTED   Tetrahydrocannabinol NONE DETECTED NONE DETECTED   Barbiturates NONE DETECTED NONE DETECTED    Comment: (NOTE) DRUG SCREEN FOR MEDICAL PURPOSES ONLY.  IF CONFIRMATION IS NEEDED FOR ANY PURPOSE, NOTIFY  LAB WITHIN 5 DAYS. LOWEST DETECTABLE LIMITS FOR URINE DRUG SCREEN Drug Class                     Cutoff (ng/mL) Amphetamine and metabolites    1000 Barbiturate and metabolites    200 Benzodiazepine                 371 Tricyclics and metabolites     300 Opiates and metabolites        300 Cocaine and metabolites        300 THC                            50 Performed at [facility], [place] [address] [place],  [address]   SARS Coronavirus 2 [facility] order, Performed in [facility] hospital lab) Nasopharyngeal Nasopharyngeal Swab     Status: None   Collection Time: 12/26/18  1:12 AM   Specimen: Nasopharyngeal Swab  Result Value Ref Range   SARS Coronavirus 2 NEGATIVE NEGATIVE    Comment: (NOTE) If result is NEGATIVE SARS-CoV-2 target nucleic acids are NOT DETECTED. The SARS-CoV-2 RNA is generally detectable in upper and lower  respiratory specimens during the acute phase of infection. The lowest  concentration of SARS-CoV-2 viral copies this assay can detect is 250  copies / mL. A negative result does not preclude SARS-CoV-2 infection  and should not be used as the sole basis for treatment or other  patient management decisions.  A negative result may occur with  improper specimen collection / handling, submission of specimen other  than nasopharyngeal swab, presence of viral mutation(s) within the  areas targeted by this assay, and inadequate number of viral copies  (<250 copies / mL). A negative result must be combined with clinical  observations, patient history, and epidemiological information. If result is POSITIVE SARS-CoV-2 target nucleic acids are DETECTED. The SARS-CoV-2 RNA is generally detectable in upper and lower  respiratory specimens dur ing the acute phase of infection.  Positive  results are indicative of active infection with SARS-CoV-2.  Clinical  correlation with patient history and other diagnostic information is  necessary to  determine patient infection status.  Positive results do  not rule out bacterial infection or co-infection with other viruses. If result is PRESUMPTIVE POSTIVE SARS-CoV-2 nucleic acids MAY BE PRESENT.   A presumptive positive result was obtained on the submitted specimen  and confirmed on repeat testing.  While 2019 novel coronavirus  (SARS-CoV-2) nucleic acids may be present in the submitted sample  additional confirmatory testing may be necessary for epidemiological  and / or clinical  management purposes  to differentiate between  SARS-CoV-2 and other Sarbecovirus currently known to infect humans.  If clinically indicated additional testing with an alternate test  methodology [phone]) is advised. The SARS-CoV-2 RNA is generally  detectable in upper and lower respiratory sp ecimens during the acute  phase of infection. The expected result is Negative. Fact Sheet for Patients:  StrictlyIdeas.no Fact Sheet for Healthcare Providers: BankingDealers.co.za This test is not yet approved or cleared by the [place] FDA and has been authorized for detection and/or diagnosis of SARS-CoV-2 by FDA under an Emergency Use Authorization (EUA).  This EUA will remain in effect (meaning this test can be used) for the duration of the COVID-19 declaration under Section 564(b)(1) of the Act, 21 U.S.C. section 360bbb-3(b)(1), unless the authorization is terminated or revoked sooner. Performed at [facility], [place] [address], [place], [place] [address]   CBC WITH DIFFERENTIAL     Status: Abnormal   Collection Time: 12/26/18  7:41 AM  Result Value Ref Range   WBC 13.1 (H) 4.0 - 10.5 K/uL   RBC 3.59 (L) 3.87 - 5.11 MIL/uL   Hemoglobin 8.9 (L) 12.0 - 15.0 g/dL   HCT 29.9 (L) 36.0 - 46.0 %   MCV 83.3 80.0 - 100.0 fL   MCH 24.8 (L) 26.0 - 34.0 pg   MCHC 29.8 (L) 30.0 - 36.0 g/dL   RDW 16.3 (H) 11.5 - 15.5 %   Platelets 402 (H) 150 -  400 K/uL   nRBC 0.0 0.0 - 0.2 %   Neutrophils Relative % 78 %   Neutro Abs 10.1 (H) 1.7 - 7.7 K/uL   Lymphocytes Relative 11 %   Lymphs Abs 1.4 0.7 - 4.0 K/uL   Monocytes Relative 10 %   Monocytes Absolute 1.4 (H) 0.1 - 1.0 K/uL   Eosinophils Relative 0 %   Eosinophils Absolute 0.0 0.0 - 0.5 K/uL   Basophils Relative 0 %   Basophils Absolute 0.1 0.0 - 0.1 K/uL   Immature Granulocytes 1 %   Abs Immature Granulocytes 0.10 (H) 0.00 - 0.07 K/uL    Comment: Performed at [facility], [place] [address] [place], [place] [address]  Comprehensive metabolic panel     Status: Abnormal   Collection Time: 12/26/18  8:12 AM  Result Value Ref Range   Sodium 141 135 - 145 mmol/L   Potassium 2.9 (L) 3.5 - 5.1 mmol/L    Comment: DELTA CHECK NOTED REPEATED TO VERIFY    Chloride 107 98 - 111 mmol/L   CO2 25 22 - 32 mmol/L   Glucose, Bld 108 (H) 70 - 99 mg/dL   BUN 10 6 - 20 mg/dL   Creatinine, Ser 0.90 0.44 - 1.00 mg/dL   Calcium 8.6 (L) 8.9 - 10.3 mg/dL   Total Protein 7.7 6.5 - 8.1 g/dL   Albumin 3.9 3.5 - 5.0 g/dL   AST 15 15 - 41 U/L   ALT 13 0 - 44 U/L   Alkaline Phosphatase 66 38 - 126 U/L   Total Bilirubin 0.5 0.3 - 1.2 mg/dL   GFR calc non Af Amer >60 >60 mL/min   GFR calc Af Amer >60 >60 mL/min   Anion gap 9 5 - 15    Comment: Performed at [facility], [place] [address], [place], [place] [address]   Ct Abdomen Pelvis W Contrast  Result Date: 12/26/2018 CLINICAL DATA:  Abdominal pain suspected diverticulitis, nausea, vomiting, umbilical and LEFT lower quadrant abdominal  pain, leukocytosis, leuko urea, negative beta HCG, hypertension EXAM: CT ABDOMEN AND PELVIS WITH CONTRAST TECHNIQUE: Multidetector CT imaging of the abdomen and pelvis was performed using the standard protocol following bolus administration of intravenous contrast. Sagittal and coronal MPR images reconstructed from axial data set. CONTRAST:  48mL OMNIPAQUE IOHEXOL 300 MG/ML SOLN IV. No  oral contrast. COMPARISON:  11/28/2018 FINDINGS: Lower chest: Subsegmental atelectasis RIGHT lung base. Posteromedial LEFT diaphragmatic defect containing gastric fundus. Hepatobiliary: Gallbladder and liver normal appearance Pancreas: Normal appearance Spleen: Normal appearance Adrenals/Urinary Tract: Adrenal glands, kidneys, ureters, and bladder normal appearance Stomach/Bowel: Normal appendix, retrocecal. Stomach and bowel loops otherwise normal appearance. Vascular/Lymphatic: Vascular structures patent. Aorta normal caliber. No adenopathy. Reproductive: Normal appearing uterus and ovaries Other: No free air or free fluid. No hernia or inflammatory process. Old healed fracture of RIGHT inferior pubic ramus. Musculoskeletal: Unremarkable IMPRESSION: Bochdalek hernia at LEFT diaphragm containing gastric fundus. No acute intra-abdominal or intrapelvic abnormalities. Electronically Signed   By: Lavonia Dana M.D.   On: 12/26/2018 10:16   Dg Abd Acute W/chest  Result Date: 12/25/2018 CLINICAL DATA:  48 year old presenting with acute onset of nausea and vomiting associated with LOWER abdominal pain that began this morning. EXAM: DG ABDOMEN ACUTE W/ 1V CHEST COMPARISON:  CT abdomen and pelvis 11/28/2018. Chest x-ray 11/28/2018 and earlier. FINDINGS: Bowel gas pattern unremarkable without evidence of obstruction or significant ileus. No evidence of free air or significant air-fluid levels on the erect image. Expected stool burden in the colon. No visible opaque urinary tract calculi. Regional skeleton unremarkable. Suboptimal inspiration. Cardiomediastinal silhouette unremarkable and unchanged. Lungs clear. Bronchovascular markings normal. Pulmonary vascularity normal. No visible pleural effusions. No pneumothorax. IMPRESSION: 1. No acute abdominal abnormality. 2. Suboptimal inspiration. No acute cardiopulmonary disease. Electronically Signed   By: Evangeline Dakin M.D.   On: 12/25/2018 21:32    Anti-infectives (From  admission, onward)   None       Assessment/Plan Intractable N/V LLQ Abdominal pain Bochdalek's hernia of the left diaphragm that contains the gastric fundus - I do not suspect the Bochdalek's hernia is the cause of her nausea and vomiting.  I suspect this is an incidental finding on CT.  The patient's pain is located in the left lower quadrant and she denies any upper abdominal pain.  Her CT scan findings have not changed since that of 8/7.  She currently is without obstructive symptoms and is havng bowel function.  She is tolerating clear liquids without any nausea or vomiting.  Her abdominal exam is benign with essentially no tenderness.  There is no indication for emergent or urgent surgery. Her diet can be advanced from our standpoint. It appears GI has also been consulted.  No further recommendations from our standpoint.  We will sign off.   Jillyn Ledger, [facility] Surgery 12/26/2018, 3:16 PM Pager: [phone]

## 2018-12-26 NOTE — ED Notes (Signed)
Pt moved into a hospital bed for comfort due to holding in the ED. Pt able to move herself from stretcher to hospital bed.

## 2018-12-26 NOTE — ED Notes (Signed)
Pt ambulated back to room with steady gait. Pt placed back on monitor and given call bell

## 2018-12-26 NOTE — ED Notes (Signed)
ED TO INPATIENT HANDOFF REPORT  ED Nurse Name and Phone #:  [phone]    S Name/Age/Gender [name] 48 y.o. female Room/Bed: WA24/WA24  Code Status   Code Status: Prior  Home/SNF/Other Home Patient oriented to: self Is this baseline? Yes   Triage Complete: Triage complete  Chief Complaint Emesis Nausea Abd. Pain  Triage Note Pt states she has had N/V and abd pain x 10 hours. Pt states pain in umbilical area and LLQ.  Emesis 10-12x. Pt states no relief with PO phenergan or zofran. Pt endorses loose stool last night as well.   Allergies Allergies  Allergen Reactions  . Lactose Intolerance (Gi) Nausea Only    Level of Care/Admitting Diagnosis ED Disposition    ED Disposition Condition Comment   Admit  Hospital Area: [facility] [[id number]]  Level of Care: Telemetry [5]  Admit to tele based on following criteria: Monitor for Ischemic changes  Covid Evaluation: Asymptomatic Screening Protocol (No Symptoms)  Diagnosis: Nausea & vomiting [267124]  Admitting Physician: Rise Patience [phone]  Attending Physician: Rise Patience [email or web address]  PT Class (Do Not Modify): Observation [104]  PT Acc Code (Do Not Modify): Observation [10022]       B Medical/Surgery History Past Medical History:  Diagnosis Date  . Anxiety   . Arthritis    "joints ache all over" (10/15/2014)  . Bulging lumbar disc   . Chronic lower back pain   . DDD (degenerative disc disease), cervical   . Depression   . Drug-seeking behavior   . Headache    "weekly" (10/15/2014)  . Hyperlipemia   . Hypertension   . PTSD (post-traumatic stress disorder)   . Skin cancer    "had them cut off my arms; don't know what kind"   Past Surgical History:  Procedure Laterality Date  . ABLATION ON ENDOMETRIOSIS  2008  . HEMORRHOID SURGERY  ~ 2002     A IV Location/Drains/Wounds Patient Lines/Drains/Airways Status   Active Line/Drains/Airways    Name:   Placement date:    Placement time:   Site:   Days:   Midline Single Lumen 12/25/18 Midline Right Brachial 10 cm 0 cm   12/25/18    2235    Brachial   1          Intake/Output Last 24 hours No intake or output data in the 24 hours ending 12/26/18 0133  Labs/Imaging Results for orders placed or performed during the hospital encounter of 12/25/18 (from the past 48 hour(s))  Lipase, blood     Status: None   Collection Time: 12/25/18  3:11 PM  Result Value Ref Range   Lipase 26 11 - 51 U/L    Comment: Performed at [facility], [place] [address], [place], [place] [address]  Comprehensive metabolic panel     Status: Abnormal   Collection Time: 12/25/18  3:11 PM  Result Value Ref Range   Sodium 141 135 - 145 mmol/L   Potassium 3.6 3.5 - 5.1 mmol/L   Chloride 106 98 - 111 mmol/L   CO2 18 (L) 22 - 32 mmol/L   Glucose, Bld 158 (H) 70 - 99 mg/dL   BUN 14 6 - 20 mg/dL   Creatinine, Ser 1.36 (H) 0.44 - 1.00 mg/dL   Calcium 9.6 8.9 - 10.3 mg/dL   Total Protein 9.1 (H) 6.5 - 8.1 g/dL   Albumin 4.6 3.5 - 5.0 g/dL   AST 21 15 - 41 U/L  ALT 15 0 - 44 U/L   Alkaline Phosphatase 90 38 - 126 U/L   Total Bilirubin 0.6 0.3 - 1.2 mg/dL   GFR calc non Af Amer 46 (L) >60 mL/min   GFR calc Af Amer 54 (L) >60 mL/min   Anion gap 17 (H) 5 - 15    Comment: Performed at [facility], [place] [address] [place], [place] [address]  CBC     Status: Abnormal   Collection Time: 12/25/18  3:11 PM  Result Value Ref Range   WBC 14.7 (H) 4.0 - 10.5 K/uL   RBC 4.35 3.87 - 5.11 MIL/uL   Hemoglobin 10.8 (L) 12.0 - 15.0 g/dL   HCT 35.6 (L) 36.0 - 46.0 %   MCV 81.8 80.0 - 100.0 fL   MCH 24.8 (L) 26.0 - 34.0 pg   MCHC 30.3 30.0 - 36.0 g/dL   RDW 16.1 (H) 11.5 - 15.5 %   Platelets 536 (H) 150 - 400 K/uL   nRBC 0.0 0.0 - 0.2 %    Comment: Performed at [facility], [place] [address] [place], [place] [address]  I-Stat beta hCG blood, ED     Status: None   Collection Time: 12/25/18   3:30 PM  Result Value Ref Range   I-stat hCG, quantitative <5.0 <5 mIU/mL   Comment 3            Comment:   GEST. AGE      CONC.  (mIU/mL)   <=1 WEEK        5 - 50     2 WEEKS       50 - 500     3 WEEKS       100 - 10,000     4 WEEKS     1,000 - 30,000        FEMALE AND NON-PREGNANT FEMALE:     LESS THAN 5 mIU/mL   Urinalysis, Routine w reflex microscopic     Status: Abnormal   Collection Time: 12/25/18  6:30 PM  Result Value Ref Range   Color, Urine YELLOW YELLOW   APPearance CLOUDY (A) CLEAR   Specific Gravity, Urine 1.020 1.005 - 1.030   pH 5.0 5.0 - 8.0   Glucose, UA NEGATIVE NEGATIVE mg/dL   Hgb urine dipstick NEGATIVE NEGATIVE   Bilirubin Urine NEGATIVE NEGATIVE   Ketones, ur 20 (A) NEGATIVE mg/dL   Protein, ur 30 (A) NEGATIVE mg/dL   Nitrite NEGATIVE NEGATIVE   Leukocytes,Ua SMALL (A) NEGATIVE   RBC / HPF 6-10 0 - 5 RBC/hpf   WBC, UA 6-10 0 - 5 WBC/hpf   Bacteria, UA RARE (A) NONE SEEN   Squamous Epithelial / LPF 11-20 0 - 5   Mucus PRESENT    Hyaline Casts, UA PRESENT     Comment: Performed at [facility], [place] [address], [place], [place] [address]   Dg Abd Acute W/chest  Result Date: 12/25/2018 CLINICAL DATA:  48 year old presenting with acute onset of nausea and vomiting associated with LOWER abdominal pain that began this morning. EXAM: DG ABDOMEN ACUTE W/ 1V CHEST COMPARISON:  CT abdomen and pelvis 11/28/2018. Chest x-ray 11/28/2018 and earlier. FINDINGS: Bowel gas pattern unremarkable without evidence of obstruction or significant ileus. No evidence of free air or significant air-fluid levels on the erect image. Expected stool burden in the colon. No visible opaque urinary tract calculi. Regional skeleton unremarkable. Suboptimal inspiration. Cardiomediastinal silhouette unremarkable and unchanged. Lungs clear. Bronchovascular  markings normal. Pulmonary vascularity normal. No visible pleural effusions. No pneumothorax. IMPRESSION: 1. No acute  abdominal abnormality. 2. Suboptimal inspiration. No acute cardiopulmonary disease. Electronically Signed   By: Evangeline Dakin M.D.   On: 12/25/2018 21:32    Pending Labs Unresulted Labs (From admission, onward)    Start     Ordered   12/26/18 0125  Urine rapid drug screen (hosp performed)  ONCE - STAT,   STAT     12/26/18 0124   12/26/18 0112  SARS Coronavirus 2 [facility] order, Performed in [facility] hospital lab) Nasopharyngeal Nasopharyngeal Swab  (Symptomatic/High Risk of Exposure/Tier 1 Patients Labs with Precautions)  Once,   STAT    Question Answer Comment  Is this test for diagnosis or screening Diagnosis of ill patient   Symptomatic for COVID-19 as defined by CDC Yes   Date of Symptom Onset 12/25/2018   Hospitalized for COVID-19 No   Admitted to ICU for COVID-19 No   Previously tested for COVID-19 Yes   Resident in a congregate (group) care setting No   Employed in healthcare setting No   Pregnant No      12/26/18 0112          Vitals/Pain Today's Vitals   12/25/18 2348 12/26/18 0000 12/26/18 0030 12/26/18 0100  BP:  (!) 148/86 (!) 162/86 (!) 171/67  Pulse:  (!) 102    Resp:  (!) 9 18 16   Temp:      TempSrc:      SpO2:  99%    Weight:      PainSc: 10-Worst pain ever       Isolation Precautions Airborne and Contact precautions  Medications Medications  sodium chloride flush (NS) 0.9 % injection 3 mL (has no administration in time range)  famotidine (PEPCID) IVPB 20 mg premix (0 mg Intravenous Stopped 12/26/18 0017)  metoCLOPramide (REGLAN) injection 10 mg (10 mg Intravenous Given 12/25/18 2232)  diphenhydrAMINE (BENADRYL) injection 25 mg (25 mg Intravenous Given 12/25/18 2232)  sodium chloride 0.9 % bolus 1,000 mL (1,000 mLs Intravenous New Bag/Given 12/26/18 0040)  sodium chloride 0.9 % bolus 1,000 mL (1,000 mLs Intravenous New Bag/Given 12/25/18 2237)  morphine 4 MG/ML injection 4 mg (4 mg Intravenous Given 12/25/18 2353)  promethazine (PHENERGAN) injection 12.5 mg  (12.5 mg Intravenous Given 12/25/18 2352)  ondansetron (ZOFRAN) injection 4 mg (4 mg Intravenous Given 12/26/18 0039)    Mobility walks Low fall risk   Focused Assessments Pulmonary Assessment Handoff:  Lung sounds:   O2 Device: Room Air        R Recommendations: See Admitting Provider Note  Report given to : Mortimer Fries RN from [employer]  Additional Notes:

## 2018-12-26 NOTE — ED Notes (Signed)
Pt ambulatory to restroom and back to bed. No needs expressed at this time.

## 2018-12-26 NOTE — ED Notes (Signed)
Assisted to bathroom to void qs

## 2018-12-26 NOTE — ED Notes (Signed)
Patient transported to CT 

## 2018-12-27 ENCOUNTER — Encounter (HOSPITAL_COMMUNITY): Payer: Self-pay

## 2018-12-27 ENCOUNTER — Observation Stay (HOSPITAL_COMMUNITY): Payer: Medicare Other | Admitting: Anesthesiology

## 2018-12-27 ENCOUNTER — Encounter (HOSPITAL_COMMUNITY)
Admission: EM | Disposition: A | Discharge status: Home / Self Care | Payer: Self-pay | Source: Home / Self Care | Attending: Family Medicine

## 2018-12-27 DIAGNOSIS — I10 Essential (primary) hypertension: Secondary | ICD-10-CM | POA: Diagnosis not present

## 2018-12-27 DIAGNOSIS — E785 Hyperlipidemia, unspecified: Secondary | ICD-10-CM | POA: Diagnosis present

## 2018-12-27 DIAGNOSIS — E876 Hypokalemia: Secondary | ICD-10-CM | POA: Diagnosis present

## 2018-12-27 DIAGNOSIS — F319 Bipolar disorder, unspecified: Secondary | ICD-10-CM | POA: Diagnosis present

## 2018-12-27 DIAGNOSIS — R112 Nausea with vomiting, unspecified: Secondary | ICD-10-CM | POA: Diagnosis not present

## 2018-12-27 DIAGNOSIS — D509 Iron deficiency anemia, unspecified: Secondary | ICD-10-CM | POA: Diagnosis present

## 2018-12-27 DIAGNOSIS — F4310 Post-traumatic stress disorder, unspecified: Secondary | ICD-10-CM | POA: Diagnosis present

## 2018-12-27 DIAGNOSIS — R1084 Generalized abdominal pain: Secondary | ICD-10-CM | POA: Diagnosis not present

## 2018-12-27 DIAGNOSIS — K219 Gastro-esophageal reflux disease without esophagitis: Secondary | ICD-10-CM | POA: Diagnosis not present

## 2018-12-27 DIAGNOSIS — N809 Endometriosis, unspecified: Secondary | ICD-10-CM | POA: Diagnosis present

## 2018-12-27 DIAGNOSIS — Z20828 Contact with and (suspected) exposure to other viral communicable diseases: Secondary | ICD-10-CM | POA: Diagnosis present

## 2018-12-27 DIAGNOSIS — N179 Acute kidney failure, unspecified: Secondary | ICD-10-CM | POA: Diagnosis present

## 2018-12-27 DIAGNOSIS — K449 Diaphragmatic hernia without obstruction or gangrene: Secondary | ICD-10-CM | POA: Diagnosis not present

## 2018-12-27 DIAGNOSIS — Z915 Personal history of self-harm: Secondary | ICD-10-CM | POA: Diagnosis not present

## 2018-12-27 DIAGNOSIS — Z791 Long term (current) use of non-steroidal anti-inflammatories (NSAID): Secondary | ICD-10-CM | POA: Diagnosis not present

## 2018-12-27 DIAGNOSIS — R197 Diarrhea, unspecified: Secondary | ICD-10-CM | POA: Diagnosis present

## 2018-12-27 DIAGNOSIS — Z79899 Other long term (current) drug therapy: Secondary | ICD-10-CM | POA: Diagnosis not present

## 2018-12-27 DIAGNOSIS — K2950 Unspecified chronic gastritis without bleeding: Secondary | ICD-10-CM | POA: Diagnosis present

## 2018-12-27 DIAGNOSIS — M5030 Other cervical disc degeneration, unspecified cervical region: Secondary | ICD-10-CM | POA: Diagnosis present

## 2018-12-27 HISTORY — PX: ESOPHAGOGASTRODUODENOSCOPY (EGD) WITH PROPOFOL: SHX5813

## 2018-12-27 HISTORY — PX: BIOPSY: SHX5522

## 2018-12-27 LAB — BASIC METABOLIC PANEL
Anion gap: 10 (ref 5–15)
BUN: 8 mg/dL (ref 6–20)
CO2: 22 mmol/L (ref 22–32)
Calcium: 8.3 mg/dL — ABNORMAL LOW (ref 8.9–10.3)
Chloride: 108 mmol/L (ref 98–111)
Creatinine, Ser: 0.88 mg/dL (ref 0.44–1.00)
GFR calc Af Amer: >60 mL/min (ref >60)
GFR calc non Af Amer: >60 mL/min (ref >60)
Glucose, Bld: 85 mg/dL (ref 70–99)
Potassium: 3.6 mmol/L (ref 3.5–5.1)
Sodium: 140 mmol/L (ref 135–145)

## 2018-12-27 LAB — CBC
HCT: 28.2 % — ABNORMAL LOW (ref 36.0–46.0)
Hemoglobin: 8.3 g/dL — ABNORMAL LOW (ref 12.0–15.0)
MCH: 25.2 pg — ABNORMAL LOW (ref 26.0–34.0)
MCHC: 29.4 g/dL — ABNORMAL LOW (ref 30.0–36.0)
MCV: 85.7 fL (ref 80.0–100.0)
Platelets: 301 10*3/uL (ref 150–400)
RBC: 3.29 MIL/uL — ABNORMAL LOW (ref 3.87–5.11)
RDW: 16.4 % — ABNORMAL HIGH (ref 11.5–15.5)
WBC: 8 10*3/uL (ref 4.0–10.5)
nRBC: 0 % (ref 0.0–0.2)

## 2018-12-27 LAB — IRON AND TIBC
Iron: 27 ug/dL — ABNORMAL LOW (ref 28–170)
Saturation Ratios: 8 % — ABNORMAL LOW (ref 10.4–31.8)
TIBC: 343 ug/dL (ref 250–450)
UIBC: 316 ug/dL

## 2018-12-27 LAB — RETICULOCYTES
Immature Retic Fract: 30.4 % — ABNORMAL HIGH (ref 2.3–15.9)
RBC.: 3.29 MIL/uL — ABNORMAL LOW (ref 3.87–5.11)
Retic Count, Absolute: 41.8 10*3/uL (ref 19.0–186.0)
Retic Ct Pct: 1.3 % (ref 0.4–3.1)

## 2018-12-27 LAB — FOLATE: Folate: 5.3 ng/mL — ABNORMAL LOW (ref >5.9)

## 2018-12-27 LAB — HIV ANTIBODY (ROUTINE TESTING W REFLEX): HIV Screen 4th Generation wRfx: NONREACTIVE

## 2018-12-27 LAB — VITAMIN B12: Vitamin B-12: 328 pg/mL (ref 180–914)

## 2018-12-27 LAB — FERRITIN: Ferritin: 7 ng/mL — ABNORMAL LOW (ref 11–307)

## 2018-12-27 SURGERY — ESOPHAGOGASTRODUODENOSCOPY (EGD) WITH PROPOFOL
Anesthesia: Monitor Anesthesia Care

## 2018-12-27 MED ORDER — METOCLOPRAMIDE HCL 5 MG/ML IJ SOLN
10.0000 mg | Freq: Four times a day (QID) | INTRAMUSCULAR | Status: DC
  Administered 2018-12-27 – 2018-12-28 (×4): 10 mg via INTRAVENOUS
  Filled 2018-12-27 (×4): qty 2

## 2018-12-27 MED ORDER — SODIUM CHLORIDE 0.9% FLUSH: 10.0000 mL | Freq: Two times a day (BID) | INTRAVENOUS | Status: DC

## 2018-12-27 MED ORDER — MIDAZOLAM HCL (PF) 5 MG/ML IJ SOLN: 0.5000 mg | Freq: Once | INTRAMUSCULAR | Status: DC | PRN

## 2018-12-27 MED ORDER — AMITRIPTYLINE HCL 25 MG PO TABS
25.0000 mg | ORAL_TABLET | Freq: Every day | ORAL | Status: DC
  Administered 2018-12-27: 25 mg via ORAL
  Filled 2018-12-27: qty 1

## 2018-12-27 MED ORDER — PROPOFOL 500 MG/50ML IV EMUL
INTRAVENOUS | Status: DC | PRN
  Administered 2018-12-27: 80 ug/kg/min via INTRAVENOUS

## 2018-12-27 MED ORDER — MIDAZOLAM HCL 2 MG/2ML IJ SOLN: 0.5000 mg | Freq: Once | INTRAMUSCULAR | Status: DC | PRN

## 2018-12-27 MED ORDER — PROMETHAZINE HCL 25 MG/ML IJ SOLN
INTRAMUSCULAR | Status: AC
  Filled 2018-12-27: qty 1

## 2018-12-27 MED ORDER — MEPERIDINE HCL 25 MG/ML IJ SOLN: 6.2500 mg | INTRAMUSCULAR | Status: DC | PRN

## 2018-12-27 MED ORDER — PROPOFOL 10 MG/ML IV BOLUS
INTRAVENOUS | Status: AC
  Filled 2018-12-27: qty 40

## 2018-12-27 MED ORDER — ONDANSETRON HCL 4 MG/2ML IJ SOLN
INTRAMUSCULAR | Status: AC
  Filled 2018-12-27: qty 2

## 2018-12-27 MED ORDER — LORAZEPAM 2 MG/ML IJ SOLN
1.0000 mg | Freq: Four times a day (QID) | INTRAMUSCULAR | Status: DC | PRN
  Administered 2018-12-27: 1 mg via INTRAVENOUS
  Filled 2018-12-27: qty 1

## 2018-12-27 MED ORDER — PROPOFOL 10 MG/ML IV BOLUS
INTRAVENOUS | Status: DC | PRN
  Administered 2018-12-27: 40 mg via INTRAVENOUS
  Administered 2018-12-27 (×3): 20 mg via INTRAVENOUS

## 2018-12-27 MED ORDER — LORAZEPAM 2 MG/ML IJ SOLN
1.0000 mg | Freq: Once | INTRAMUSCULAR | Status: AC
  Administered 2018-12-27: 1 mg via INTRAVENOUS
  Filled 2018-12-27: qty 1

## 2018-12-27 MED ORDER — PROMETHAZINE HCL 25 MG/ML IJ SOLN
6.2500 mg | INTRAMUSCULAR | Status: AC | PRN
  Administered 2018-12-27 (×2): 6.25 mg via INTRAVENOUS

## 2018-12-27 SURGICAL SUPPLY — 15 items

## 2018-12-27 NOTE — Progress Notes (Signed)
[facility]  PROGRESS NOTE  [name] TXM:[id number] DOB: January 19, 1971 DOA: 12/25/2018 PCP: Andreas Blower, MD   Brief HPI:   48 year old female with a history of hypertension, depression, previous history of endometriosis was admitted to the hospital with intractable nausea vomiting.  CT scan of the abdomen pelvis showed diaphragmatic hernia.  GI was consulted.    Subjective   Patient seen and examined, plan for EGD today.   Assessment/Plan:     1. Intractable nausea vomiting-unclear etiology, GI has been consulted.  Plan for EGD this morning.  Continue Zofran PRN for nausea vomiting.  2. Hypokalemia-resolved, today potassium is 3.6.  3. Hypertension-lisinopril/HCTZ on hold, continue PRN hydralazine.  4. Chronic anemia-we will follow anemia panel.  5. History of depression-continue fluoxetine, Lamictal, trazodone.    CBC: Recent Labs  Lab 12/25/18 1511 12/26/18 0741 12/27/18 0544  WBC 14.7* 13.1* 8.0  NEUTROABS  --  10.1*  --   HGB 10.8* 8.9* 8.3*  HCT 35.6* 29.9* 28.2*  MCV 81.8 83.3 85.7  PLT 536* 402* 482    Basic Metabolic Panel: Recent Labs  Lab 12/25/18 1511 12/26/18 0812 12/27/18 0544  NA 141 141 140  K 3.6 2.9* 3.6  CL 106 107 108  CO2 18* 25 22  GLUCOSE 158* 108* 85  BUN 14 10 8   CREATININE 1.36* 0.90 0.88  CALCIUM 9.6 8.6* 8.3*     DVT prophylaxis: Lovenox  Code Status: Full code  Family Communication: No family at bedside  Disposition Plan: likely home when medically ready for discharge     BMI  Estimated body mass index is 33.64 kg/m as calculated from the following:   Height as of this encounter: 5\' 4"  (1.626 m).   Weight as of this encounter: 88.9 kg.  Scheduled medications:  . amitriptyline  25 mg Oral QHS  . enoxaparin (LOVENOX) injection  40 mg Subcutaneous Q24H  . FLUoxetine  40 mg Oral Daily  . lamoTRIgine  150 mg Oral BID  . metoCLOPramide (REGLAN) injection  10 mg Intravenous Q6H  .  morphine  injection  2 mg Intravenous Once  . ondansetron  4 mg Intravenous Q6H  . pantoprazole (PROTONIX) IV  40 mg Intravenous Q12H  . risperiDONE  0.5 mg Oral BID  . sodium chloride flush  10-40 mL Intracatheter Q12H  . sodium chloride flush  3 mL Intravenous Once  . tiZANidine  8 mg Oral TID  . traZODone  150 mg Oral QHS    Consultants:  GI  Procedures:     Antibiotics:   Anti-infectives (From admission, onward)   None       Objective   Vitals:   12/27/18 1130 12/27/18 1151 12/27/18 1304 12/27/18 1442  BP: (!) 198/85 (!) 177/87 (!) 170/95 (!) 166/86  Pulse: (!) 115 98 93   Resp: 19 20 16    Temp:  99.1 F (37.3 C) 98.9 F (37.2 C)   TempSrc:  Oral Oral   SpO2: 98% 93% 96%   Weight:      Height:        Intake/Output Summary (Last 24 hours) at 12/27/2018 1557 Last data filed at 12/27/2018 1430 Gross per 24 hour  Intake 2682.16 ml  Output 3 ml  Net 2679.16 ml   Filed Weights   12/25/18 1457 12/26/18 0900 12/26/18 1100  Weight: 88.8 kg 90.7 kg 88.9 kg     Physical Examination:    General-appears in no acute distress  Heart-S1-S2, regular, no murmur auscultated  Lungs-clear to auscultation bilaterally, no wheezing or crackles auscultated  Abdomen-soft, nontender, no organomegaly  Extremities-no edema in the lower extremities  Neuro-alert, oriented x3, no focal deficit noted     Data Reviewed: I have personally reviewed following labs and imaging studies   Recent Results (from the past 240 hour(s))  SARS Coronavirus 2 [facility] order, Performed in [facility] hospital lab) Nasopharyngeal Nasopharyngeal Swab     Status: None   Collection Time: 12/26/18  1:12 AM   Specimen: Nasopharyngeal Swab  Result Value Ref Range Status   SARS Coronavirus 2 NEGATIVE NEGATIVE Final    Comment: (NOTE) If result is NEGATIVE SARS-CoV-2 target nucleic acids are NOT DETECTED. The SARS-CoV-2 RNA is generally detectable in upper and lower  respiratory specimens during  the acute phase of infection. The lowest  concentration of SARS-CoV-2 viral copies this assay can detect is 250  copies / mL. A negative result does not preclude SARS-CoV-2 infection  and should not be used as the sole basis for treatment or other  patient management decisions.  A negative result may occur with  improper specimen collection / handling, submission of specimen other  than nasopharyngeal swab, presence of viral mutation(s) within the  areas targeted by this assay, and inadequate number of viral copies  (<250 copies / mL). A negative result must be combined with clinical  observations, patient history, and epidemiological information. If result is POSITIVE SARS-CoV-2 target nucleic acids are DETECTED. The SARS-CoV-2 RNA is generally detectable in upper and lower  respiratory specimens dur ing the acute phase of infection.  Positive  results are indicative of active infection with SARS-CoV-2.  Clinical  correlation with patient history and other diagnostic information is  necessary to determine patient infection status.  Positive results do  not rule out bacterial infection or co-infection with other viruses. If result is PRESUMPTIVE POSTIVE SARS-CoV-2 nucleic acids MAY BE PRESENT.   A presumptive positive result was obtained on the submitted specimen  and confirmed on repeat testing.  While 2019 novel coronavirus  (SARS-CoV-2) nucleic acids may be present in the submitted sample  additional confirmatory testing may be necessary for epidemiological  and / or clinical management purposes  to differentiate between  SARS-CoV-2 and other Sarbecovirus currently known to infect humans.  If clinically indicated additional testing with an alternate test  methodology [phone]) is advised. The SARS-CoV-2 RNA is generally  detectable in upper and lower respiratory sp ecimens during the acute  phase of infection. The expected result is Negative. Fact Sheet for Patients:   StrictlyIdeas.no Fact Sheet for Healthcare Providers: BankingDealers.co.za This test is not yet approved or cleared by the [place] FDA and has been authorized for detection and/or diagnosis of SARS-CoV-2 by FDA under an Emergency Use Authorization (EUA).  This EUA will remain in effect (meaning this test can be used) for the duration of the COVID-19 declaration under Section 564(b)(1) of the Act, 21 U.S.C. section 360bbb-3(b)(1), unless the authorization is terminated or revoked sooner. Performed at [facility], [place] [address] [place], [place] [address]      Liver Function Tests: Recent Labs  Lab 12/25/18 1511 12/26/18 0812  AST 21 15  ALT 15 13  ALKPHOS 90 66  BILITOT 0.6 0.5  PROT 9.1* 7.7  ALBUMIN 4.6 3.9   Recent Labs  Lab 12/25/18 1511  LIPASE 26      Studies: Ct Abdomen Pelvis W Contrast  Result Date: 12/26/2018 CLINICAL DATA:  Abdominal pain suspected diverticulitis, nausea,  vomiting, umbilical and LEFT lower quadrant abdominal pain, leukocytosis, leuko urea, negative beta HCG, hypertension EXAM: CT ABDOMEN AND PELVIS WITH CONTRAST TECHNIQUE: Multidetector CT imaging of the abdomen and pelvis was performed using the standard protocol following bolus administration of intravenous contrast. Sagittal and coronal MPR images reconstructed from axial data set. CONTRAST:  65mL OMNIPAQUE IOHEXOL 300 MG/ML SOLN IV. No oral contrast. COMPARISON:  11/28/2018 FINDINGS: Lower chest: Subsegmental atelectasis RIGHT lung base. Posteromedial LEFT diaphragmatic defect containing gastric fundus. Hepatobiliary: Gallbladder and liver normal appearance Pancreas: Normal appearance Spleen: Normal appearance Adrenals/Urinary Tract: Adrenal glands, kidneys, ureters, and bladder normal appearance Stomach/Bowel: Normal appendix, retrocecal. Stomach and bowel loops otherwise normal appearance. Vascular/Lymphatic: Vascular  structures patent. Aorta normal caliber. No adenopathy. Reproductive: Normal appearing uterus and ovaries Other: No free air or free fluid. No hernia or inflammatory process. Old healed fracture of RIGHT inferior pubic ramus. Musculoskeletal: Unremarkable IMPRESSION: Bochdalek hernia at LEFT diaphragm containing gastric fundus. No acute intra-abdominal or intrapelvic abnormalities. Electronically Signed   By: Lavonia Dana M.D.   On: 12/26/2018 10:16   Dg Abd Acute W/chest  Result Date: 12/25/2018 CLINICAL DATA:  48 year old presenting with acute onset of nausea and vomiting associated with LOWER abdominal pain that began this morning. EXAM: DG ABDOMEN ACUTE W/ 1V CHEST COMPARISON:  CT abdomen and pelvis 11/28/2018. Chest x-ray 11/28/2018 and earlier. FINDINGS: Bowel gas pattern unremarkable without evidence of obstruction or significant ileus. No evidence of free air or significant air-fluid levels on the erect image. Expected stool burden in the colon. No visible opaque urinary tract calculi. Regional skeleton unremarkable. Suboptimal inspiration. Cardiomediastinal silhouette unremarkable and unchanged. Lungs clear. Bronchovascular markings normal. Pulmonary vascularity normal. No visible pleural effusions. No pneumothorax. IMPRESSION: 1. No acute abdominal abnormality. 2. Suboptimal inspiration. No acute cardiopulmonary disease. Electronically Signed   By: Evangeline Dakin M.D.   On: 12/25/2018 21:32     Admission status: Inpatient: Based on patients clinical presentation and evaluation of above clinical data, I have made determination that patient meets Inpatient criteria at this time.  Time spent: 20 min  [facility] Pager [phone]. If 7PM-7AM, please contact night-coverage at [email or web address], Office  [phone]  password [email or web address]  12/27/2018, 3:57 PM  LOS: 0 days

## 2018-12-27 NOTE — Transfer of Care (Signed)
Immediate Anesthesia Transfer of Care Note  Patient: [name]  Procedure(s) Performed: Procedure(s): ESOPHAGOGASTRODUODENOSCOPY (EGD) WITH PROPOFOL (N/A) BIOPSY  Patient Location: PACU  Anesthesia Type:MAC  Level of Consciousness:  sedated, patient cooperative and responds to stimulation  Airway & Oxygen Therapy:Patient Spontanous Breathing and Patient connected to face mask oxgen  Post-op Assessment:  Report given to PACU RN and Post -op Vital signs reviewed and stable  Post vital signs:  Reviewed and stable  Last Vitals:  Vitals:   12/27/18 0639 12/27/18 1006  BP: (!) 142/75 (!) 204/91  Pulse: 91 86  Resp: 20 17  Temp: 36.8 C 36.9 C  SpO2: 06% 26%    Complications: No apparent anesthesia complications

## 2018-12-27 NOTE — Anesthesia Postprocedure Evaluation (Signed)
Anesthesia Post Note  Patient: [name]  Procedure(s) Performed: ESOPHAGOGASTRODUODENOSCOPY (EGD) WITH PROPOFOL (N/A ) BIOPSY     Patient location during evaluation: Endoscopy Anesthesia Type: MAC Level of consciousness: awake and alert, oriented and patient cooperative Pain management: pain level controlled Vital Signs Assessment: post-procedure vital signs reviewed and stable Respiratory status: spontaneous breathing, nonlabored ventilation and respiratory function stable Cardiovascular status: blood pressure returned to baseline and stable : nausea improving. Anesthetic complications: no    Last Vitals:  Vitals:   12/27/18 0639 12/27/18 1006  BP: (!) 142/75 (!) 204/91  Pulse: 91 86  Resp: 20 17  Temp: 36.8 C 36.9 C  SpO2: 91% 92%    Last Pain:  Vitals:   12/27/18 1006  TempSrc: Oral  PainSc: 0-No pain                 Hasna Stefanik,E. Bryker Fletchall

## 2018-12-27 NOTE — Progress Notes (Signed)
RN called to room by patient. Pt visibly anxious and requesting to be dc'd. States she feels she can recover better at home in her own environment. MD notified. Per MD pt not ready for discharge and has gastric emptying study in am. Ativan 1mg  x1 ordered. Discussed with pt and Ativan administered. Will continue to monitor.

## 2018-12-27 NOTE — Op Note (Signed)
[facility] Patient Name: [name] Procedure Date: 12/27/2018 MRN: [id number] Attending MD: Thornton Park MD, MD Date of Birth: 10/25/1970 CSN: [id number] Age: 48 Admit Type: Inpatient Procedure:                Upper GI endoscopy Indications:              Nausea with vomiting Providers:                Thornton Park MD, MD, Baird Cancer, RN,                            William Dalton, Technician Referring MD:              Medicines:                See the Anesthesia note for documentation of the                            administered medications Complications:            No immediate complications. Estimated blood loss:                            Minimal. Estimated Blood Loss:     Estimated blood loss was minimal. Procedure:                Pre-Anesthesia Assessment:                           - Prior to the procedure, a History and Physical                            was performed, and patient medications and                            allergies were reviewed. The patient's tolerance of                            previous anesthesia was also reviewed. The risks                            and benefits of the procedure and the sedation                            options and risks were discussed with the patient.                            All questions were answered, and informed consent                            was obtained. Prior Anticoagulants: The patient has                            taken no previous anticoagulant or antiplatelet                            agents. ASA Grade  Assessment: II - A patient with                            mild systemic disease. After reviewing the risks                            and benefits, the patient was deemed in                            satisfactory condition to undergo the procedure.                           After obtaining informed consent, the endoscope was                            passed under direct vision.  Throughout the                            procedure, the patient's blood pressure, pulse, and                            oxygen saturations were monitored continuously. The                            [id number]-H190 ([id number] Olympus gastroscope was                            introduced through the mouth, and advanced to the                            third part of duodenum. The upper GI endoscopy was                            accomplished without difficulty. The patient                            tolerated the procedure well. Scope In: Scope Out: Findings:      The examined esophagus was normal. Biopsies were taken from the distal       and mid/proximal esophagus with a cold forceps for histology.      The entire examined stomach was normal. A small hiatal hernia is       present. Biopsies were taken from the antrum and fundus with a cold       forceps for histology. Estimated blood loss was minimal.      The examined duodenum was normal. Biopsies were taken with a cold       forceps for histology.      The cardia and gastric fundus were normal on retroflexion. Impression:               - Normal esophagus. Biopsied.                           - Small hiatal hernia. Otherwise, normal stomach.  Biopsied.                           - Normal examined duodenum. Biopsied. Moderate Sedation:      Not Applicable - Patient had care per Anesthesia. Recommendation:           - Return patient to hospital ward for ongoing care.                           - Clear liquid diet today. Advance as tolerated.                           - Continue present medications.                           - Await pathology results.                           - Gastric emptying scan tomorrow.                           - I recommend a trial of amitrtylene 25 mg QHS for                            possible cyclic vomiting syndrome. (Normal QTc on                            multiple EKGs) Procedure  Code(s):        --- Professional ---                           [phone], Esophagogastroduodenoscopy, flexible,                            transoral; with biopsy, single or multiple Diagnosis Code(s):        --- Professional ---                           R11.2, Nausea with vomiting, unspecified CPT copyright 2019 American Medical Association. All rights reserved. The codes documented in this report are preliminary and upon coder review may  be revised to meet current compliance requirements. Thornton Park MD, MD 12/27/2018 10:55:24 AM This report has been signed electronically. Number of Addenda: 0

## 2018-12-28 MED ORDER — SODIUM CHLORIDE 0.9 % IV SOLN
125.0000 mg | Freq: Once | INTRAVENOUS | Status: AC
  Administered 2018-12-28: 125 mg via INTRAVENOUS
  Filled 2018-12-28: qty 10

## 2018-12-28 MED ORDER — FOLIC ACID 1 MG PO TABS: 1.0000 mg | ORAL_TABLET | Freq: Every day | ORAL | 1 refills | Status: AC

## 2018-12-28 MED ORDER — ONDANSETRON HCL 4 MG PO TABS: 4.0000 mg | ORAL_TABLET | Freq: Four times a day (QID) | ORAL | 0 refills | Status: DC | PRN

## 2018-12-28 MED ORDER — FERROUS GLUCONATE 324 (38 FE) MG PO TABS: 324.0000 mg | ORAL_TABLET | Freq: Two times a day (BID) | ORAL | 3 refills | Status: DC

## 2018-12-28 MED ORDER — AMITRIPTYLINE HCL 25 MG PO TABS: 25.0000 mg | ORAL_TABLET | Freq: Every day | ORAL | 2 refills | Status: DC

## 2018-12-28 MED ORDER — ACETAMINOPHEN 325 MG PO TABS: 650.0000 mg | ORAL_TABLET | Freq: Four times a day (QID) | ORAL | Status: DC | PRN

## 2018-12-28 NOTE — Discharge Summary (Signed)
Physician Discharge Summary  [name] YQI:[id number] DOB: [birth year] DOA: 12/25/2018  PCP: Andreas Blower, MD  Admit date: 12/25/2018 Discharge date: 12/28/2018  Time spent: *40 minutes  Recommendations for Outpatient Follow-up:  1. Follow-up general surgery in 3 to 4 weeks 2. Follow-up GI in 1 week   Discharge Diagnoses:  Principal Problem:   Nausea & vomiting Active Problems:   Abdominal pain, generalized   Endometriosis   Intractable nausea and vomiting   Essential hypertension   ARF (acute renal failure) ([place])   Discharge Condition: Stable  Diet recommendation: Soft diet  Filed Weights   12/25/18 1457 12/26/18 0900 12/26/18 1100  Weight: 88.8 kg 90.7 kg 88.9 kg    History of present illness:  48 year old female with a history of hypertension, depression, previous history of endometriosis was admitted to the hospital with intractable nausea vomiting.  CT scan of the abdomen pelvis showed diaphragmatic hernia.  GI was consulted.   Hospital Course:   1. Intractable nausea vomiting-unclear etiology,  CT scan showed left Bochdalek hernia involving left diaphragm.  General surgery was consulted and they did not feel that patient symptom was due to hernia.  GI was consulted, patient underwent EGD which showed no significant cause of patient's intractable nausea and vomiting.  Gastric emptying study was ordered but patient does not want to stay in the hospital for 3 more days.  As the study was performed on Tuesday as Monday is a holiday.  GI has recommended patient can be discharged and outpatient gastric emptying will be done.  Will discharge on Zofran PRN.  Amitriptyline 25 mg p.o. nightly has been ordered by GI.    2. Bochdalek left diaphragmatic hernia.  General surgery was consulted, they do not feel that patient symptoms are due to left diaphragmatic hernia.  Nevertheless they will follow patient as outpatient in case she wants to have it  repaired.  3. Hypokalemia-resolved, today potassium is 3.6.  3. Hypertension-lisinopril/HCTZ   4. Chronic anemia-she has iron deficiency anemia.  Will give IV iron Feraheme 510 mg x 1 before discharge.  Will discharge on ferrous gluconate 324 mg p.o. twice daily.  Folic acid is 5.3.  Will start folic acid 1 mg p.o. daily  5. History of depression-continue fluoxetine, Lamictal, trazodone.    Procedures:  EGD  Consultations:  Gastroenterology  Discharge Exam: Vitals:   12/28/18 0648 12/28/18 1206  BP: 132/64 (!) 108/59  Pulse: 79 90  Resp: 14 20  Temp: 98.3 F (36.8 C) 98.1 F (36.7 C)  SpO2: 97% 93%    General: Appears in no acute distress Cardiovascular: S1-S2, regular Respiratory: Clear to auscultation bilaterally  Discharge Instructions   Discharge Instructions    Diet - low sodium heart healthy   Complete by: As directed    Increase activity slowly   Complete by: As directed      Allergies as of 12/28/2018      Reactions   Lactose Intolerance (gi) Nausea Only      Medication List    STOP taking these medications   ibuprofen 200 MG tablet Commonly known as: ADVIL   ondansetron 4 MG disintegrating tablet Commonly known as: Zofran ODT   promethazine 25 MG tablet Commonly known as: PHENERGAN     TAKE these medications   acetaminophen 325 MG tablet Commonly known as: TYLENOL Take 2 tablets (650 mg total) by mouth every 6 (six) hours as needed for mild pain (or Fever >/= 101).   amitriptyline 25 MG tablet  Commonly known as: ELAVIL Take 1 tablet (25 mg total) by mouth at bedtime.   ferrous gluconate 324 MG tablet Commonly known as: FERGON Take 1 tablet (324 mg total) by mouth 2 (two) times daily with a meal.   FLUoxetine 40 MG capsule Commonly known as: PROZAC Take 40 mg by mouth daily.   folic acid 1 MG tablet Commonly known as: FOLVITE Take 1 tablet (1 mg total) by mouth daily.   hydrochlorothiazide 25 MG tablet Commonly known as:  HYDRODIURIL Take 25 mg by mouth daily.   lamoTRIgine 150 MG tablet Commonly known as: LAMICTAL Take 150 mg by mouth 2 (two) times daily.   lisinopril 10 MG tablet Commonly known as: ZESTRIL Take 10 mg by mouth daily.   omeprazole 20 MG capsule Commonly known as: PRILOSEC Take 20 mg by mouth daily.   ondansetron 4 MG tablet Commonly known as: ZOFRAN Take 1 tablet (4 mg total) by mouth every 6 (six) hours as needed for nausea.   risperiDONE 0.5 MG tablet Commonly known as: RISPERDAL Take 0.5 mg by mouth 2 (two) times daily.   tiZANidine 4 MG tablet Commonly known as: ZANAFLEX Take 8 mg by mouth 3 (three) times daily.   traZODone 150 MG tablet Commonly known as: DESYREL Take 150 mg by mouth at bedtime.      Allergies  Allergen Reactions  . Lactose Intolerance (Gi) Nausea Only   Follow-up Information    Sur[facility], [place]. Call.   Specialty: General Surgery Why: If you would like to discuss possible elective repair of your Bochdalek hernia at LEFT diaphragm seen on CT scan, please call the office to schedue an appointment with Dr. Johney Maine or Dr. Rosendo Gros.  Contact information: [place] [address] [phone]        Thornton Park, MD. Schedule an appointment as soon as possible for a visit in 1 week(s).   Specialty: Gastroenterology Contact information: [place] [address] [phone]            The results of significant diagnostics from this hospitalization (including imaging, microbiology, ancillary and laboratory) are listed below for reference.    Significant Diagnostic Studies: Ct Abdomen Pelvis W Contrast  Result Date: 12/26/2018 CLINICAL DATA:  Abdominal pain suspected diverticulitis, nausea, vomiting, umbilical and LEFT lower quadrant abdominal pain, leukocytosis, leuko urea, negative beta HCG, hypertension EXAM: CT ABDOMEN AND PELVIS WITH CONTRAST TECHNIQUE: Multidetector CT imaging of the  abdomen and pelvis was performed using the standard protocol following bolus administration of intravenous contrast. Sagittal and coronal MPR images reconstructed from axial data set. CONTRAST:  12mL OMNIPAQUE IOHEXOL 300 MG/ML SOLN IV. No oral contrast. COMPARISON:  11/28/2018 FINDINGS: Lower chest: Subsegmental atelectasis RIGHT lung base. Posteromedial LEFT diaphragmatic defect containing gastric fundus. Hepatobiliary: Gallbladder and liver normal appearance Pancreas: Normal appearance Spleen: Normal appearance Adrenals/Urinary Tract: Adrenal glands, kidneys, ureters, and bladder normal appearance Stomach/Bowel: Normal appendix, retrocecal. Stomach and bowel loops otherwise normal appearance. Vascular/Lymphatic: Vascular structures patent. Aorta normal caliber. No adenopathy. Reproductive: Normal appearing uterus and ovaries Other: No free air or free fluid. No hernia or inflammatory process. Old healed fracture of RIGHT inferior pubic ramus. Musculoskeletal: Unremarkable IMPRESSION: Bochdalek hernia at LEFT diaphragm containing gastric fundus. No acute intra-abdominal or intrapelvic abnormalities. Electronically Signed   By: Lavonia Dana M.D.   On: 12/26/2018 10:16   Dg Abd Acute W/chest  Result Date: 12/25/2018 CLINICAL DATA:  48 year old presenting with acute onset of nausea and vomiting  associated with LOWER abdominal pain that began this morning. EXAM: DG ABDOMEN ACUTE W/ 1V CHEST COMPARISON:  CT abdomen and pelvis 11/28/2018. Chest x-ray 11/28/2018 and earlier. FINDINGS: Bowel gas pattern unremarkable without evidence of obstruction or significant ileus. No evidence of free air or significant air-fluid levels on the erect image. Expected stool burden in the colon. No visible opaque urinary tract calculi. Regional skeleton unremarkable. Suboptimal inspiration. Cardiomediastinal silhouette unremarkable and unchanged. Lungs clear. Bronchovascular markings normal. Pulmonary vascularity normal. No visible  pleural effusions. No pneumothorax. IMPRESSION: 1. No acute abdominal abnormality. 2. Suboptimal inspiration. No acute cardiopulmonary disease. Electronically Signed   By: Evangeline Dakin M.D.   On: 12/25/2018 21:32    Microbiology: Recent Results (from the past 240 hour(s))  SARS Coronavirus 2 [facility] order, Performed in [facility] hospital lab) Nasopharyngeal Nasopharyngeal Swab     Status: None   Collection Time: 12/26/18  1:12 AM   Specimen: Nasopharyngeal Swab  Result Value Ref Range Status   SARS Coronavirus 2 NEGATIVE NEGATIVE Final    Comment: (NOTE) If result is NEGATIVE SARS-CoV-2 target nucleic acids are NOT DETECTED. The SARS-CoV-2 RNA is generally detectable in upper and lower  respiratory specimens during the acute phase of infection. The lowest  concentration of SARS-CoV-2 viral copies this assay can detect is 250  copies / mL. A negative result does not preclude SARS-CoV-2 infection  and should not be used as the sole basis for treatment or other  patient management decisions.  A negative result may occur with  improper specimen collection / handling, submission of specimen other  than nasopharyngeal swab, presence of viral mutation(s) within the  areas targeted by this assay, and inadequate number of viral copies  (<250 copies / mL). A negative result must be combined with clinical  observations, patient history, and epidemiological information. If result is POSITIVE SARS-CoV-2 target nucleic acids are DETECTED. The SARS-CoV-2 RNA is generally detectable in upper and lower  respiratory specimens dur ing the acute phase of infection.  Positive  results are indicative of active infection with SARS-CoV-2.  Clinical  correlation with patient history and other diagnostic information is  necessary to determine patient infection status.  Positive results do  not rule out bacterial infection or co-infection with other viruses. If result is PRESUMPTIVE POSTIVE SARS-CoV-2  nucleic acids MAY BE PRESENT.   A presumptive positive result was obtained on the submitted specimen  and confirmed on repeat testing.  While 2019 novel coronavirus  (SARS-CoV-2) nucleic acids may be present in the submitted sample  additional confirmatory testing may be necessary for epidemiological  and / or clinical management purposes  to differentiate between  SARS-CoV-2 and other Sarbecovirus currently known to infect humans.  If clinically indicated additional testing with an alternate test  methodology [phone]) is advised. The SARS-CoV-2 RNA is generally  detectable in upper and lower respiratory sp ecimens during the acute  phase of infection. The expected result is Negative. Fact Sheet for Patients:  StrictlyIdeas.no Fact Sheet for Healthcare Providers: BankingDealers.co.za This test is not yet approved or cleared by the [place] FDA and has been authorized for detection and/or diagnosis of SARS-CoV-2 by FDA under an Emergency Use Authorization (EUA).  This EUA will remain in effect (meaning this test can be used) for the duration of the COVID-19 declaration under Section 564(b)(1) of the Act, 21 U.S.C. section 360bbb-3(b)(1), unless the authorization is terminated or revoked sooner. Performed at [facility], [place] [address] [place], [place] [address]  Labs: Basic Metabolic Panel: Recent Labs  Lab 12/25/18 1511 12/26/18 0812 12/27/18 0544  NA 141 141 140  K 3.6 2.9* 3.6  CL 106 107 108  CO2 18* 25 22  GLUCOSE 158* 108* 85  BUN 14 10 8   CREATININE 1.36* 0.90 0.88  CALCIUM 9.6 8.6* 8.3*   Liver Function Tests: Recent Labs  Lab 12/25/18 1511 12/26/18 0812  AST 21 15  ALT 15 13  ALKPHOS 90 66  BILITOT 0.6 0.5  PROT 9.1* 7.7  ALBUMIN 4.6 3.9   Recent Labs  Lab 12/25/18 1511  LIPASE 26   No results for input(s): AMMONIA in the last 168 hours. CBC: Recent Labs  Lab  12/25/18 1511 12/26/18 0741 12/27/18 0544  WBC 14.7* 13.1* 8.0  NEUTROABS  --  10.1*  --   HGB 10.8* 8.9* 8.3*  HCT 35.6* 29.9* 28.2*  MCV 81.8 83.3 85.7  PLT 536* 402* 301    Signed:  Oswald Hillock MD.  [facility] 12/28/2018, 12:33 PM

## 2018-12-28 NOTE — Progress Notes (Signed)
[facility] Progress Note  CC:  Nausea and vomiting  Subjective:  Feeling better.  Pain is better and nausea and vomiting is better.  Would like to try to eat.  Objective:  Vital signs in last 24 hours: Temp:  [98.3 F (36.8 C)-99.1 F (37.3 C)] 98.3 F (36.8 C) (09/06 0648) Pulse Rate:  [79-119] 79 (09/06 0648) Resp:  [14-29] 14 (09/06 0648) BP: (106-224)/(55-98) 132/64 (09/06 0648) SpO2:  [92 %-98 %] 97 % (09/06 0648) Last BM Date: 12/25/18 General:  Alert, Well-developed, in NAD; resting with eyes closed most of the visit. Heart:  Regular rate and rhythm; no murmurs Pulm:  CTAB.  No increased WOB. Abdomen:  Soft, non-distended.  BS present.  Non-tender. Extremities:  Without edema. Neurologic:  Alert and oriented x 4;  grossly normal neurologically. Psych:  Alert and cooperative. Normal mood and affect.  Intake/Output from previous day: 09/05 0701 - 09/06 0700 In: 2388.1 [P.O.:702; I.V.:1686.1] Out: 3 [Urine:3] Intake/Output this shift: Total I/O In: 240 [P.O.:240] Out: -   Lab Results: Recent Labs    12/25/18 1511 12/26/18 0741 12/27/18 0544  WBC 14.7* 13.1* 8.0  HGB 10.8* 8.9* 8.3*  HCT 35.6* 29.9* 28.2*  PLT 536* 402* 301   BMET Recent Labs    12/25/18 1511 12/26/18 0812 12/27/18 0544  NA 141 141 140  K 3.6 2.9* 3.6  CL 106 107 108  CO2 18* 25 22  GLUCOSE 158* 108* 85  BUN 14 10 8   CREATININE 1.36* 0.90 0.88  CALCIUM 9.6 8.6* 8.3*   LFT Recent Labs    12/26/18 0812  PROT 7.7  ALBUMIN 3.9  AST 15  ALT 13  ALKPHOS 66  BILITOT 0.5   Ct Abdomen Pelvis W Contrast  Result Date: 12/26/2018 CLINICAL DATA:  Abdominal pain suspected diverticulitis, nausea, vomiting, umbilical and LEFT lower quadrant abdominal pain, leukocytosis, leuko urea, negative beta HCG, hypertension EXAM: CT ABDOMEN AND PELVIS WITH CONTRAST TECHNIQUE: Multidetector CT imaging of the abdomen and pelvis was performed using the standard protocol following  bolus administration of intravenous contrast. Sagittal and coronal MPR images reconstructed from axial data set. CONTRAST:  65mL OMNIPAQUE IOHEXOL 300 MG/ML SOLN IV. No oral contrast. COMPARISON:  11/28/2018 FINDINGS: Lower chest: Subsegmental atelectasis RIGHT lung base. Posteromedial LEFT diaphragmatic defect containing gastric fundus. Hepatobiliary: Gallbladder and liver normal appearance Pancreas: Normal appearance Spleen: Normal appearance Adrenals/Urinary Tract: Adrenal glands, kidneys, ureters, and bladder normal appearance Stomach/Bowel: Normal appendix, retrocecal. Stomach and bowel loops otherwise normal appearance. Vascular/Lymphatic: Vascular structures patent. Aorta normal caliber. No adenopathy. Reproductive: Normal appearing uterus and ovaries Other: No free air or free fluid. No hernia or inflammatory process. Old healed fracture of RIGHT inferior pubic ramus. Musculoskeletal: Unremarkable IMPRESSION: Bochdalek hernia at LEFT diaphragm containing gastric fundus. No acute intra-abdominal or intrapelvic abnormalities. Electronically Signed   By: Lavonia Dana M.D.   On: 12/26/2018 10:16   Assessment / Plan: *Acute, nonbloody, non-CGE emesis in patient with history of periodic bouts of nausea, vomiting. Bochdalek's hernia on CTs.  EGD on 9/5 with small hiatal hernia, but otherwise normal.  Biopsies taken and are pending.  Amitriptyline 25 mg qHS started last evening.  *Hypokalemia, resolved.  *Anxiety/depression/bipolar disorder, previous suicide attempts.  On multiple meds.  *Normocytic anemia.  Chronic.  Although iron studies show iron deficiency.  No period for 4 months and prior to that were normal.  No overt GI bleeding.   **Discussed with Dr. Darrick Meigs and the patient.  GES was  ordered but likely will not get done on the weekend or holiday so that would push it to Tuesday.  She was anxious to leave yesterday.  I am going to place her on a soft diet and if she does well then can be  discharged today with GI follow-up in a few weeks at which time we can re-discuss need for GES and possible colonoscopy as well (for evaluation of IDA).  He is going to give her a dose of IV iron before discharge today as well.  Should continue amitriptyline 25 mg at bedtime upon discharge and maintain on a soft diet (which I discussed with her today).     LOS: 1 day   Laban Emperor. Seith Aikey  12/28/2018, 9:21 AM

## 2018-12-30 ENCOUNTER — Encounter (HOSPITAL_COMMUNITY): Payer: Self-pay | Admitting: Gastroenterology

## 2019-01-01 ENCOUNTER — Encounter: Payer: Self-pay | Admitting: Gastroenterology

## 2019-01-06 ENCOUNTER — Other Ambulatory Visit: Payer: Self-pay

## 2019-01-06 ENCOUNTER — Emergency Department (HOSPITAL_COMMUNITY)
Admission: EM | Admit: 2019-01-06 | Discharge: 2019-01-06 | Disposition: A | Discharge status: Home / Self Care | Payer: Medicare Other | Attending: Emergency Medicine | Admitting: Emergency Medicine

## 2019-01-06 ENCOUNTER — Encounter (HOSPITAL_COMMUNITY): Payer: Self-pay | Admitting: Emergency Medicine

## 2019-01-06 ENCOUNTER — Emergency Department (HOSPITAL_COMMUNITY): Payer: Medicare Other

## 2019-01-06 DIAGNOSIS — R109 Unspecified abdominal pain: Secondary | ICD-10-CM

## 2019-01-06 DIAGNOSIS — R112 Nausea with vomiting, unspecified: Secondary | ICD-10-CM | POA: Diagnosis not present

## 2019-01-06 DIAGNOSIS — G8929 Other chronic pain: Secondary | ICD-10-CM | POA: Diagnosis not present

## 2019-01-06 DIAGNOSIS — R1084 Generalized abdominal pain: Secondary | ICD-10-CM | POA: Diagnosis not present

## 2019-01-06 DIAGNOSIS — I10 Essential (primary) hypertension: Secondary | ICD-10-CM | POA: Insufficient documentation

## 2019-01-06 DIAGNOSIS — Z79899 Other long term (current) drug therapy: Secondary | ICD-10-CM | POA: Diagnosis not present

## 2019-01-06 LAB — CBC
HCT: 37.8 % (ref 36.0–46.0)
Hemoglobin: 11.4 g/dL — ABNORMAL LOW (ref 12.0–15.0)
MCH: 25.1 pg — ABNORMAL LOW (ref 26.0–34.0)
MCHC: 30.2 g/dL (ref 30.0–36.0)
MCV: 83.1 fL (ref 80.0–100.0)
Platelets: 436 10*3/uL — ABNORMAL HIGH (ref 150–400)
RBC: 4.55 MIL/uL (ref 3.87–5.11)
RDW: 17.7 % — ABNORMAL HIGH (ref 11.5–15.5)
WBC: 13.2 10*3/uL — ABNORMAL HIGH (ref 4.0–10.5)
nRBC: 0 % (ref 0.0–0.2)

## 2019-01-06 LAB — URINALYSIS, ROUTINE W REFLEX MICROSCOPIC
Bilirubin Urine: NEGATIVE
Glucose, UA: NEGATIVE mg/dL
Hgb urine dipstick: NEGATIVE
Ketones, ur: 80 mg/dL — AB
Nitrite: NEGATIVE
Protein, ur: 100 mg/dL — AB
Specific Gravity, Urine: 1.026 (ref 1.005–1.030)
pH: 5 (ref 5.0–8.0)

## 2019-01-06 LAB — COMPREHENSIVE METABOLIC PANEL
ALT: 17 U/L (ref 0–44)
AST: 23 U/L (ref 15–41)
Albumin: 4.6 g/dL (ref 3.5–5.0)
Alkaline Phosphatase: 79 U/L (ref 38–126)
Anion gap: 18 — ABNORMAL HIGH (ref 5–15)
BUN: 14 mg/dL (ref 6–20)
CO2: 20 mmol/L — ABNORMAL LOW (ref 22–32)
Calcium: 9.9 mg/dL (ref 8.9–10.3)
Chloride: 104 mmol/L (ref 98–111)
Creatinine, Ser: 1.24 mg/dL — ABNORMAL HIGH (ref 0.44–1.00)
GFR calc Af Amer: 60 mL/min — ABNORMAL LOW (ref >60)
GFR calc non Af Amer: 52 mL/min — ABNORMAL LOW (ref >60)
Glucose, Bld: 178 mg/dL — ABNORMAL HIGH (ref 70–99)
Potassium: 3.3 mmol/L — ABNORMAL LOW (ref 3.5–5.1)
Sodium: 142 mmol/L (ref 135–145)
Total Bilirubin: 0.5 mg/dL (ref 0.3–1.2)
Total Protein: 8.9 g/dL — ABNORMAL HIGH (ref 6.5–8.1)

## 2019-01-06 LAB — LIPASE, BLOOD: Lipase: 24 U/L (ref 11–51)

## 2019-01-06 MED ORDER — HALOPERIDOL LACTATE 5 MG/ML IJ SOLN
5.0000 mg | Freq: Once | INTRAMUSCULAR | Status: AC
  Administered 2019-01-06: 18:00:00 5 mg via INTRAMUSCULAR
  Filled 2019-01-06: qty 1

## 2019-01-06 MED ORDER — LORAZEPAM 2 MG/ML IJ SOLN
0.5000 mg | Freq: Once | INTRAMUSCULAR | Status: AC
  Administered 2019-01-06: 17:00:00 0.5 mg via INTRAVENOUS
  Filled 2019-01-06: qty 1

## 2019-01-06 MED ORDER — LORAZEPAM 2 MG/ML IJ SOLN: 1.0000 mg | Freq: Once | INTRAMUSCULAR | Status: DC

## 2019-01-06 MED ORDER — ALUM & MAG HYDROXIDE-SIMETH 200-200-20 MG/5ML PO SUSP
30.0000 mL | Freq: Once | ORAL | Status: AC
  Administered 2019-01-06: 20:00:00 30 mL via ORAL
  Filled 2019-01-06: qty 30

## 2019-01-06 MED ORDER — ONDANSETRON 4 MG PO TBDP: 4.0000 mg | ORAL_TABLET | Freq: Once | ORAL | Status: DC

## 2019-01-06 MED ORDER — HALOPERIDOL LACTATE 5 MG/ML IJ SOLN
5.0000 mg | Freq: Once | INTRAMUSCULAR | Status: AC
  Administered 2019-01-06: 17:00:00 5 mg via INTRAMUSCULAR
  Filled 2019-01-06: qty 1

## 2019-01-06 MED ORDER — SODIUM CHLORIDE 0.9 % IV BOLUS: 1000.0000 mL | Freq: Once | INTRAVENOUS | Status: DC

## 2019-01-06 MED ORDER — LIDOCAINE VISCOUS HCL 2 % MT SOLN
15.0000 mL | Freq: Once | OROMUCOSAL | Status: AC
  Administered 2019-01-06: 15 mL via ORAL
  Filled 2019-01-06: qty 15

## 2019-01-06 MED ORDER — ONDANSETRON HCL 4 MG/2ML IJ SOLN
4.0000 mg | Freq: Once | INTRAMUSCULAR | Status: AC
  Administered 2019-01-06: 17:00:00 4 mg via INTRAVENOUS
  Filled 2019-01-06: qty 2

## 2019-01-06 MED ORDER — SODIUM CHLORIDE 0.9 % IV BOLUS
1000.0000 mL | Freq: Once | INTRAVENOUS | Status: AC
  Administered 2019-01-06: 17:00:00 1000 mL via INTRAVENOUS

## 2019-01-06 MED ORDER — LORAZEPAM 2 MG/ML IJ SOLN
1.0000 mg | Freq: Once | INTRAMUSCULAR | Status: AC
  Administered 2019-01-06: 18:00:00 1 mg via INTRAVENOUS
  Filled 2019-01-06: qty 1

## 2019-01-06 NOTE — ED Triage Notes (Signed)
Per pt, states she was here with the same symptoms on 9/3-states she started vomiting around 0530-unable to keep PO's down-states she is to follow up with GI next week-states they said she had a hernia-complaining of left abdominal/flank pain-states she feels ike she has to urinate but can't

## 2019-01-06 NOTE — ED Provider Notes (Signed)
[facility] DEPT Provider Note   CSN: [id number] Arrival date & time: 01/06/19  1352     History   Chief Complaint Chief Complaint  Patient presents with  . Abdominal Pain    HPI ROYA GIESELMAN is a 48 y.o. female.     HPI Patient with chronic abdominal pain and nausea and vomiting.  Recently admitted earlier this month.  Had EGD which was normal.  Was scheduled for outpatient gastric emptying study but patient did not complete this.  Patient states that she woke up this morning at 5:30 AM with abdominal pain and multiple episodes of vomiting.  No diarrhea.  No grossly bloody or melanotic stools.  No fever or chills.  Patient states has not been able to tolerate any of her medications today. Past Medical History:  Diagnosis Date  . Anxiety   . Arthritis    "joints ache all over" (10/15/2014)  . Bulging lumbar disc   . Chronic lower back pain   . DDD (degenerative disc disease), cervical   . Depression   . Drug-seeking behavior   . Headache    "weekly" (10/15/2014)  . Hyperlipemia   . Hypertension   . PTSD (post-traumatic stress disorder)   . Skin cancer    "had them cut off my arms; don't know what kind"    Patient Active Problem List   Diagnosis Date Noted  . Nausea & vomiting 12/26/2018  . Essential hypertension 12/26/2018  . ARF (acute renal failure) ([place]) 12/26/2018  . Intractable nausea and vomiting 11/28/2018  . Bipolar affective disorder ([place]) 11/28/2018  . Acute gastroenteritis 11/19/2015  . C. difficile colitis   . SIRS (systemic inflammatory response syndrome) ([place]) 01/11/2015  . Cellulitis 01/03/2015  . Opiate withdrawal ([place]) 12/18/2014  . Herpes simplex virus type 1 (HSV-1) dermatitis   . Sacral fracture ([place]) 12/12/2014  . Leukocytosis 12/12/2014  . Chronic lower back pain 12/12/2014  . Sacral fracture, closed ([place]) 12/12/2014  . Nausea with vomiting   . Intractable abdominal pain 10/15/2014  . Abdominal pain  10/15/2014  . Hypokalemia 09/04/2014  . Sepsis ([place]) 09/01/2014  . Abdominal pain, generalized 09/01/2014  . PTSD (post-traumatic stress disorder) 09/01/2014  . Endometriosis 09/01/2014  . Sinus tachycardia 09/01/2014  . Lactic acidosis 09/01/2014  . Severe sepsis ([place]) 09/01/2014    Past Surgical History:  Procedure Laterality Date  . ABLATION ON ENDOMETRIOSIS  2008  . BIOPSY  12/27/2018   Procedure: BIOPSY;  Surgeon: Thornton Park, MD;  Location: [facility];  Service: Gastroenterology;;  . ESOPHAGOGASTRODUODENOSCOPY (EGD) WITH PROPOFOL N/A 12/27/2018   Procedure: ESOPHAGOGASTRODUODENOSCOPY (EGD) WITH PROPOFOL;  Surgeon: Thornton Park, MD;  Location: [facility];  Service: Gastroenterology;  Laterality: N/A;  . HEMORRHOID SURGERY  ~ 2002     OB History   No obstetric history on file.      Home Medications    Prior to Admission medications   Medication Sig Start Date End Date Taking? Authorizing Provider  acetaminophen (TYLENOL) 325 MG tablet Take 2 tablets (650 mg total) by mouth every 6 (six) hours as needed for mild pain (or Fever >/= 101). 12/28/18   Oswald Hillock, MD  amitriptyline (ELAVIL) 25 MG tablet Take 1 tablet (25 mg total) by mouth at bedtime. 12/28/18   Oswald Hillock, MD  ferrous gluconate (FERGON) 324 MG tablet Take 1 tablet (324 mg total) by mouth 2 (two) times daily with a meal. 12/28/18   Oswald Hillock, MD  FLUoxetine [facility])  40 MG capsule Take 40 mg by mouth daily.  06/26/17   [provider]  folic acid (FOLVITE) 1 MG tablet Take 1 tablet (1 mg total) by mouth daily. 12/28/18 02/26/19  Oswald Hillock, MD  hydrochlorothiazide (HYDRODIURIL) 25 MG tablet Take 25 mg by mouth daily.  06/26/17   [provider]  lamoTRIgine (LAMICTAL) 150 MG tablet Take 150 mg by mouth 2 (two) times daily.    [provider]  lisinopril (ZESTRIL) 10 MG tablet Take 10 mg by mouth daily.     [provider]  omeprazole (PRILOSEC) 20 MG capsule Take 20  mg by mouth daily.    [provider]  ondansetron (ZOFRAN) 4 MG tablet Take 1 tablet (4 mg total) by mouth every 6 (six) hours as needed for nausea. 12/28/18   Oswald Hillock, MD  risperiDONE (RISPERDAL) 0.5 MG tablet Take 0.5 mg by mouth 2 (two) times daily. 03/03/18   [provider]  tiZANidine (ZANAFLEX) 4 MG tablet Take 8 mg by mouth 3 (three) times daily.     [provider]  traZODone (DESYREL) 150 MG tablet Take 150 mg by mouth at bedtime.  06/03/17   [provider]    Family History No family history on file.  Social History Social History   Tobacco Use  . Smoking status: Never Smoker  . Smokeless tobacco: Never Used  Substance Use Topics  . Alcohol use: Yes    Comment: 10/15/2014 "I've drank before; nothing regular; don't drink now cause of RX I'm on"  . Drug use: No     Allergies   Lactose intolerance (gi)   Review of Systems Review of Systems  Constitutional: Negative for chills and fever.  HENT: Negative for sore throat and trouble swallowing.   Eyes: Negative for visual disturbance.  Respiratory: Negative for cough and shortness of breath.   Cardiovascular: Negative for chest pain and palpitations.  Gastrointestinal: Positive for abdominal pain, nausea and vomiting. Negative for blood in stool, constipation and diarrhea.  Genitourinary: Negative for dysuria, flank pain, frequency, hematuria and pelvic pain.  Musculoskeletal: Negative for arthralgias, back pain, joint swelling, myalgias and neck pain.  Skin: Negative for rash and wound.  Neurological: Negative for dizziness, weakness, light-headedness, numbness and headaches.  Psychiatric/Behavioral: Negative for sleep disturbance. The patient is nervous/anxious.   All other systems reviewed and are negative.    Physical Exam Updated Vital Signs BP (!) 168/95   Pulse (!) 108   Temp 98.1 F (36.7 C) (Oral)   Resp 15   SpO2 95%   Physical Exam Vitals signs and nursing  note reviewed.  Constitutional:      Appearance: She is well-developed. She is not ill-appearing.  HENT:     Head: Normocephalic and atraumatic.     Nose: Nose normal.     Mouth/Throat:     Mouth: Mucous membranes are moist.  Eyes:     Extraocular Movements: Extraocular movements intact.     Pupils: Pupils are equal, round, and reactive to light.  Neck:     Musculoskeletal: Normal range of motion and neck supple. No neck rigidity or muscular tenderness.  Cardiovascular:     Rate and Rhythm: Normal rate and regular rhythm.     Heart sounds: No murmur. No friction rub. No gallop.   Pulmonary:     Effort: Pulmonary effort is normal. No respiratory distress.     Breath sounds: Normal breath sounds. No stridor. No wheezing, rhonchi or rales.  Chest:     Chest wall: No tenderness.  Abdominal:     General: Bowel sounds are normal.     Palpations: Abdomen is soft.     Tenderness: There is abdominal tenderness. There is no guarding or rebound.     Comments: Diffuse abdominal tenderness without focality, rebound or guarding.  Musculoskeletal: Normal range of motion.        General: No swelling, tenderness, deformity or signs of injury.     Right lower leg: No edema.     Left lower leg: No edema.  Lymphadenopathy:     Cervical: No cervical adenopathy.  Skin:    General: Skin is warm and dry.     Findings: No erythema or rash.  Neurological:     General: No focal deficit present.     Mental Status: She is alert and oriented to person, place, and time.  Psychiatric:        Behavior: Behavior normal.     Comments: Anxious appearing      ED Treatments / Results  Labs (all labs ordered are listed, but only abnormal results are displayed) Labs Reviewed  COMPREHENSIVE METABOLIC PANEL - Abnormal; Notable for the following components:      Result Value   Potassium 3.3 (*)    CO2 20 (*)    Glucose, Bld 178 (*)    Creatinine, Ser 1.24 (*)    Total Protein 8.9 (*)    GFR calc non Af  Amer 52 (*)    GFR calc Af Amer 60 (*)    Anion gap 18 (*)    All other components within normal limits  CBC - Abnormal; Notable for the following components:   WBC 13.2 (*)    Hemoglobin 11.4 (*)    MCH 25.1 (*)    RDW 17.7 (*)    Platelets 436 (*)    All other components within normal limits  URINALYSIS, ROUTINE W REFLEX MICROSCOPIC - Abnormal; Notable for the following components:   APPearance HAZY (*)    Ketones, ur 80 (*)    Protein, ur 100 (*)    Leukocytes,Ua TRACE (*)    Bacteria, UA RARE (*)    All other components within normal limits  LIPASE, BLOOD  I-STAT BETA HCG BLOOD, ED ([facility], [facility], [facility] ONLY)    EKG None  Radiology Dg Abd Acute 2+v W 1v Chest  Result Date: 01/06/2019 CLINICAL DATA:  Abdomen pain EXAM: DG ABDOMEN ACUTE W/ 1V CHEST COMPARISON:  December 25, 2018 FINDINGS: There is no evidence of dilated bowel loops or free intraperitoneal air. There is relative paucity of bowel gas. The no radiopaque calculi or other significant radiographic abnormality is seen. Heart size and mediastinal contours are within normal limits. Both lungs are clear. IMPRESSION: Negative abdominal radiographs.  No acute cardiopulmonary disease. Electronically Signed   By: Abelardo Diesel M.D.   On: 01/06/2019 19:52    Procedures Procedures (including critical care time)  Medications Ordered in ED Medications  sodium chloride 0.9 % bolus 1,000 mL (has no administration in time range)  haloperidol lactate (HALDOL) injection 5 mg (5 mg Intramuscular Given 01/06/19 1701)  ondansetron (ZOFRAN) injection 4 mg (4 mg Intravenous Given 01/06/19 1651)  LORazepam (ATIVAN) injection 0.5 mg (0.5 mg Intravenous Given 01/06/19 1655)  sodium chloride 0.9 % bolus 1,000 mL (1,000 mLs Intravenous New Bag/Given (Non-Interop) 01/06/19 1707)  LORazepam (ATIVAN) injection 1 mg (1 mg Intravenous Given 01/06/19 1812)  haloperidol lactate (HALDOL) injection 5 mg (5  mg Intramuscular Given 01/06/19 1810)  alum & mag  hydroxide-simeth (MAALOX/MYLANTA) 200-200-20 MG/5ML suspension 30 mL (30 mLs Oral Given 01/06/19 2022)    And  lidocaine (XYLOCAINE) 2 % viscous mouth solution 15 mL (15 mLs Oral Given 01/06/19 2024)     Initial Impression / Assessment and Plan / ED Course  I have reviewed the triage vital signs and the nursing notes.  Pertinent labs & imaging results that were available during my care of the patient were reviewed by me and considered in my medical decision making (see chart for details).       Patient is no longer vomiting.  Tolerating oral intake.  Tachycardia has improved with IV fluids.  Patient is requesting discharge home.  Understands the need to follow-up with gastroenterology.  Strict return precautions have been given.   Final Clinical Impressions(s) / ED Diagnoses   Final diagnoses:  Chronic abdominal pain  Non-intractable vomiting with nausea, unspecified vomiting type    ED Discharge Orders    None       Julianne Rice, MD 01/06/19 2114

## 2019-01-06 NOTE — ED Notes (Signed)
Pt ambulated to restroom but was unable to go. Bladder scanned patient, only 38mL

## 2019-01-15 NOTE — Progress Notes (Signed)
 Referring Provider: Karam, Phillip Jerome, * Primary Care Physician:  Karam, Phillip Jerome, MD  Chief complaint:  Cyclical vomiting   IMPRESSION:  Iron deficiency with a non-diagnostic EGD    - labs 12/27/18: iron 27, ferritin 7, saturation ratio 8, hgb 8.3, MCV 85.7, RDW 16.4 Barrett's esophagus on biopsy 12/27/18 Cyclical vomiting syndrome Family history of colon polyps (grandmother)  Suspected cyclic vomiting syndrome using Rome IV criteria. Recent EGD and contrasted CT of the abd/pelvis excluded concurrent etiologies.   Colonoscopy recommended given the unexplained   PLAN: Increase amitriptyline to 50 mg QHS Avoid all NSAIDs Colonoscopy given the unexplained iron deficiency anemia Repeat EGD for Barrett's biopsies  I consented the patient discussing the risks, benefits, and alternatives to endoscopic evaluation. In particular, we discussed the risks that include, but are not limited to, reaction to medication, cardiopulmonary compromise, bleeding requiring blood transfusion, aspiration resulting in pneumonia, perforation requiring surgery, lack of diagnosis, severe illness requiring hospitalization, and even death. We reviewed the risk of missed lesion including polyps or even cancer. The patient acknowledges these risks and asks that we proceed.  Please see the "Patient Instructions" section for addition details about the plan.  HPI: [name] is a 47 y.o. female She reports a history of anxiety, arthritis, barrett's esophagus, depression, hypertension, IBS, obesity, endometriosis, seizures, and hemorrhoidectomy in 1999.  Previous suicide attempts most recently 02/2015 with an intentional xanaflex overdose.  Microcytic anemia 11/2017 (Hgb 10.4, MCV 75).  I met her during her recent hospitalization for nausea and vomiting. The interval history is obtained through the patient and review of her electronic health record.   Patient reports years of intermittent nausea/vomiting.  Associated abdominal cramping and occasionally headaches. No GI symptoms between episodes. No identified triggers except for potentially spicy food. Frequent NSAIDs. No MJ. Has never seen a neurologist for her headaches.   Previously evaluated by Dr. Meisenheimer in 2016 with EGD, abdominal ultrasound, HIDA, and treated with PRN antiemetics. Liver enzymes and lipase are always normal.  CT scan in the ED 12/26/18 showed an incidental finding of Bochdalek hernia.  No other findings.   Grandmother with colon polyps. No other known family history of colon cancer or polyps. No family history of uterine/endometrial cancer, pancreatic cancer or gastric/stomach cancer.  Labs show a microscopic anemia, not previously evaluated. No symptoms associated with the anemia.  No fatigue, weakness, headache, irritability, exercise intolerance, exertional dyspnea, vertigo, or angina pectoris.  No pica.  No beeturia.  No hearing loss.    No overt GI blood loss. No melena, hematochezia, bright red blood per rectum. No epistaxis, vaginal bleeding, hemoptysis, or hematuria.   Her nausea and vomiting has largely improved on low-dose amitryltine but she is confused about why she needs an antidepressant in addition to her other medications.    Prior endoscopy: EGD 01/2015 with Dr. Meisenheimer: Procedure note not found but pathology shows normal distal esophageal biopsies.  Serum H. pylori IgA negative. EGD 12/27/18: small hiatal hernia. Biopsies showed negative duodenum, reactive gastropathy without H pylori, and Barrett's esophagus.    Past Medical History:  Diagnosis Date  . Anxiety   . Arthritis    "joints ache all over" (10/15/2014)  . Bulging lumbar disc   . Chronic lower back pain   . DDD (degenerative disc disease), cervical   . Depression   . Drug-seeking behavior   . Headache    "weekly" (10/15/2014)  . Hyperlipemia   . Hypertension   . PTSD (post-traumatic stress disorder)   .   Skin cancer    "had them  cut off my arms; don't know what kind"    Past Surgical History:  Procedure Laterality Date  . ABLATION ON ENDOMETRIOSIS  2008  . BIOPSY  12/27/2018   Procedure: BIOPSY;  Surgeon: Thornton Park, MD;  Location: [facility];  Service: Gastroenterology;;  . ESOPHAGOGASTRODUODENOSCOPY (EGD) WITH PROPOFOL N/A 12/27/2018   Procedure: ESOPHAGOGASTRODUODENOSCOPY (EGD) WITH PROPOFOL;  Surgeon: Thornton Park, MD;  Location: [facility];  Service: Gastroenterology;  Laterality: N/A;  . HEMORRHOID SURGERY  ~ 2002    Current Outpatient Medications  Medication Sig Dispense Refill  . acetaminophen (TYLENOL) 325 MG tablet Take 2 tablets (650 mg total) by mouth every 6 (six) hours as needed for mild pain (or Fever >/= 101).    Marland Kitchen amitriptyline (ELAVIL) 25 MG tablet Take 1 tablet (25 mg total) by mouth at bedtime. 30 tablet 2  . ferrous gluconate (FERGON) 324 MG tablet Take 1 tablet (324 mg total) by mouth 2 (two) times daily with a meal. 60 tablet 3  . FLUoxetine (PROZAC) 40 MG capsule Take 40 mg by mouth daily.     . folic acid (FOLVITE) 1 MG tablet Take 1 tablet (1 mg total) by mouth daily. 30 tablet 1  . hydrochlorothiazide (HYDRODIURIL) 25 MG tablet Take 25 mg by mouth daily.     Marland Kitchen lamoTRIgine (LAMICTAL) 150 MG tablet Take 150 mg by mouth 2 (two) times daily.    Marland Kitchen lisinopril (ZESTRIL) 10 MG tablet Take 10 mg by mouth daily.     Marland Kitchen omeprazole (PRILOSEC) 20 MG capsule Take 20 mg by mouth daily.    . ondansetron (ZOFRAN) 4 MG tablet Take 1 tablet (4 mg total) by mouth every 6 (six) hours as needed for nausea. 20 tablet 0  . risperiDONE (RISPERDAL) 0.5 MG tablet Take 0.5 mg by mouth 2 (two) times daily.  2  . tiZANidine (ZANAFLEX) 4 MG tablet Take 8 mg by mouth 3 (three) times daily.     . traZODone (DESYREL) 150 MG tablet Take 150 mg by mouth at bedtime.      No current facility-administered medications for this visit.     Allergies as of 01/16/2019 - Review Complete 12/27/2018  Allergen  Reaction Noted  . Lactose intolerance (gi) Nausea Only 12/11/2014    No family history on file.  Social History   Socioeconomic History  . Marital status: Married    Spouse name: Not on file  . Number of children: Not on file  . Years of education: Not on file  . Highest education level: Not on file  Occupational History  . Not on file  Social Needs  . Financial resource strain: Not on file  . Food insecurity    Worry: Not on file    Inability: Not on file  . Transportation needs    Medical: Not on file    Non-medical: Not on file  Tobacco Use  . Smoking status: Never Smoker  . Smokeless tobacco: Never Used  Substance and Sexual Activity  . Alcohol use: Yes    Comment: 10/15/2014 "I've drank before; nothing regular; don't drink now cause of RX I'm on"  . Drug use: No  . Sexual activity: Not Currently  Lifestyle  . Physical activity    Days per week: Not on file    Minutes per session: Not on file  . Stress: Not on file  Relationships  . Social connections    Talks on phone: Not on file  Gets together: Not on file    Attends religious service: Not on file    Active member of club or organization: Not on file    Attends meetings of clubs or organizations: Not on file    Relationship status: Not on file  . Intimate partner violence    Fear of current or ex partner: Not on file    Emotionally abused: Not on file    Physically abused: Not on file    Forced sexual activity: Not on file  Other Topics Concern  . Not on file  Social History Narrative  . Not on file   ROS: Anxiety, back pain, confusion, depression, fatigue, muscle pains, night sweats, shortness of breath, insomnia, urine leakage.   Physical Exam: General:   Alert,  well-nourished, pleasant and cooperative in NAD Head:  Normocephalic and atraumatic. Eyes:  Sclera clear, no icterus.   Conjunctiva pink. Ears:  Normal auditory acuity. Nose:  No deformity, discharge,  or lesions. Mouth:  No deformity  or lesions.   Neck:  Supple; no masses or thyromegaly. Lungs:  Clear throughout to auscultation.   No wheezes. Heart:  Regular rate and rhythm; no murmurs. Abdomen:  Central obesity, Soft,nontender, nondistended, normal bowel sounds, no rebound or guarding. No hepatosplenomegaly.   Rectal:  Deferred  Msk:  Symmetrical. No boney deformities LAD: No inguinal or umbilical LAD Extremities:  No clubbing or edema. Neurologic:  Alert and  oriented x4;  grossly nonfocal Skin:  Intact without significant lesions or rashes. Psych:  Alert and cooperative. Normal mood and affect.     Marvelene Stoneberg L. Tarri Glenn, MD, MPH 01/15/2019, 5:23 PM

## 2019-01-16 ENCOUNTER — Encounter: Payer: Self-pay | Admitting: Gastroenterology

## 2019-01-16 ENCOUNTER — Ambulatory Visit (INDEPENDENT_AMBULATORY_CARE_PROVIDER_SITE_OTHER): Payer: Medicare Other | Admitting: Gastroenterology

## 2019-01-16 VITALS — BP 106/80 | HR 91 | Temp 97.2°F | Ht 64.5 in | Wt 197.0 lb

## 2019-01-16 DIAGNOSIS — D509 Iron deficiency anemia, unspecified: Secondary | ICD-10-CM

## 2019-01-16 DIAGNOSIS — R112 Nausea with vomiting, unspecified: Secondary | ICD-10-CM

## 2019-01-16 MED ORDER — AMITRIPTYLINE HCL 50 MG PO TABS: 50.0000 mg | ORAL_TABLET | Freq: Every day | ORAL | 3 refills | Status: DC

## 2019-01-16 MED ORDER — NA SULFATE-K SULFATE-MG SULF 17.5-3.13-1.6 GM/177ML PO SOLN: 1.0000 | ORAL | 0 refills | Status: AC

## 2019-01-16 NOTE — Patient Instructions (Addendum)
Let's increase your amitriptyline to 50 mg at bedtime to see if it can further help with your nausea and vomiting.  I have recommended a colonoscopy to evaluate your anemia.   Tips for colonoscopy:  - Stay well hydrated for 3-4 days prior to the exam. This reduces nausea and dehydration.  - To prevent skin/hemorrhoid irritation - prior to wiping, put A&Dointment or vaseline on the toilet paper. - Keep a towel or pad on the bed.  - Keep the jug of laxative as cold as possible. Consider keeping in a tub in of ice while you are drinking it.  - Drink  64oz of clear liquids in the morning of prep day (prior to starting the prep) to be sure that there is enough fluid to flush the colon and stay hydrated!!!! This is in addition to the fluids required for preparation. - Use of a flavored hard candy, such as grape Anise Salvo, can counteract some of the flavor of the prep and may prevent some nausea.

## 2019-02-19 DIAGNOSIS — F319 Bipolar disorder, unspecified: Secondary | ICD-10-CM | POA: Diagnosis not present

## 2019-02-19 DIAGNOSIS — Z139 Encounter for screening, unspecified: Secondary | ICD-10-CM | POA: Diagnosis not present

## 2019-02-19 DIAGNOSIS — R5383 Other fatigue: Secondary | ICD-10-CM | POA: Diagnosis not present

## 2019-02-19 DIAGNOSIS — I10 Essential (primary) hypertension: Secondary | ICD-10-CM | POA: Diagnosis not present

## 2019-02-19 DIAGNOSIS — G4700 Insomnia, unspecified: Secondary | ICD-10-CM | POA: Diagnosis not present

## 2019-02-19 DIAGNOSIS — F331 Major depressive disorder, recurrent, moderate: Secondary | ICD-10-CM | POA: Diagnosis not present

## 2019-02-19 DIAGNOSIS — E663 Overweight: Secondary | ICD-10-CM | POA: Diagnosis not present

## 2019-03-04 ENCOUNTER — Encounter: Payer: Self-pay | Admitting: Gastroenterology

## 2019-03-04 ENCOUNTER — Ambulatory Visit (AMBULATORY_SURGERY_CENTER): Payer: Medicare Other | Admitting: Gastroenterology

## 2019-03-04 ENCOUNTER — Other Ambulatory Visit: Payer: Self-pay

## 2019-03-04 VITALS — BP 131/80 | HR 75 | Temp 97.9°F | Resp 21 | Ht 64.0 in | Wt 197.0 lb

## 2019-03-04 DIAGNOSIS — K219 Gastro-esophageal reflux disease without esophagitis: Secondary | ICD-10-CM | POA: Diagnosis not present

## 2019-03-04 DIAGNOSIS — D120 Benign neoplasm of cecum: Secondary | ICD-10-CM | POA: Diagnosis not present

## 2019-03-04 DIAGNOSIS — D509 Iron deficiency anemia, unspecified: Secondary | ICD-10-CM

## 2019-03-04 DIAGNOSIS — K5289 Other specified noninfective gastroenteritis and colitis: Secondary | ICD-10-CM | POA: Diagnosis not present

## 2019-03-04 DIAGNOSIS — D122 Benign neoplasm of ascending colon: Secondary | ICD-10-CM | POA: Diagnosis not present

## 2019-03-04 DIAGNOSIS — I10 Essential (primary) hypertension: Secondary | ICD-10-CM | POA: Diagnosis not present

## 2019-03-04 DIAGNOSIS — F319 Bipolar disorder, unspecified: Secondary | ICD-10-CM | POA: Diagnosis not present

## 2019-03-04 MED ORDER — SODIUM CHLORIDE 0.9 % IV SOLN: 500.0000 mL | Freq: Once | INTRAVENOUS | Status: DC

## 2019-03-04 NOTE — Op Note (Signed)
[name] Procedure Date: 03/04/2019 1:24 PM MRN: [id number] Endoscopist: Thornton Park MD, MD Age: 48 Referring MD:  Date of Birth: 1970/11/29 Gender: Female Account #: [id number] Procedure:                Colonoscopy Indications:              Unexplained iron deficiency anemia with                            non-diagnostic EGD Medicines:                Monitored Anesthesia Care Procedure:                Pre-Anesthesia Assessment:                           - Prior to the procedure, a History and Physical                            was performed, and patient medications and                            allergies were reviewed. The patient's tolerance of                            previous anesthesia was also reviewed. The risks                            and benefits of the procedure and the sedation                            options and risks were discussed with the patient.                            All questions were answered, and informed consent                            was obtained. Prior Anticoagulants: The patient has                            taken no previous anticoagulant or antiplatelet                            agents. ASA Grade Assessment: II - A patient with                            mild systemic disease. After reviewing the risks                            and benefits, the patient was deemed in                            satisfactory condition to undergo the procedure.  After obtaining informed consent, the colonoscope                            was passed under direct vision. Throughout the                            procedure, the patient's blood pressure, pulse, and                            oxygen saturations were monitored continuously. The                            Colonoscope was introduced through the anus and                            advanced to the the terminal ileum, with                        identification of the appendiceal orifice and IC                            valve. The colonoscopy was performed without                            difficulty. The patient tolerated the procedure                            well. The quality of the bowel preparation was                            excellent. The terminal ileum, ileocecal valve,                            appendiceal orifice, and rectum were photographed. Scope In: 1:31:35 PM Scope Out: 1:49:12 PM Scope Withdrawal Time: 0 hours 14 minutes 35 seconds  Total Procedure Duration: 0 hours 17 minutes 37 seconds  Findings:                 The perianal and digital rectal examinations were                            normal.                           A 1 mm polyp was found in the ileocecal valve. The                            polyp was sessile. The polyp was removed with a                            cold biopsy forceps. Resection and retrieval were                            complete. Estimated blood loss: none.  A 3 mm polyp was found in the proximal ascending                            colon. The polyp was sessile. The polyp was removed                            with a cold snare. Resection and retrieval were                            complete. Estimated blood loss was minimal.                           Patchy mild inflammation characterized by erythema                            and granularity was found in the rectum. Biopsies                            were taken with a cold forceps for histology.                           The exam was otherwise without abnormality on                            direct and retroflexion views. Complications:            No immediate complications. Estimated blood loss:                            Minimal. Estimated Blood Loss:     Estimated blood loss was minimal. Impression:               - One 1 mm polyp at the ileocecal valve, removed                             with a cold biopsy forceps. Resected and retrieved.                           - One 3 mm polyp in the proximal ascending colon,                            removed with a cold snare. Resected and retrieved.                           - Patchy mild inflammation was found in the rectum.                            This is of unclear clinical significance and may be                            related to prep artifact. Biopsied.                           -  The examination was otherwise normal on direct                            and retroflexion views. Recommendation:           - Patient has a contact number available for                            emergencies. The signs and symptoms of potential                            delayed complications were discussed with the                            patient. Return to normal activities tomorrow.                            Written discharge instructions were provided to the                            patient.                           - Resume previous diet today.                           - Continue present medications.                           - Await pathology results.                           - Repeat colonoscopy date to be determined after                            pending pathology results are reviewed for                            surveillance based on pathology results.                           - If rectal pathology does not show chronic                            changes, will need to consider capsule endoscopy                            for further evaluation of the iron deficiency. Thornton Park MD, MD 03/04/2019 2:00:10 PM This report has been signed electronically.

## 2019-03-04 NOTE — Patient Instructions (Addendum)
YOU HAD AN ENDOSCOPIC PROCEDURE TODAY AT THE [facility]:   Refer to the procedure report that was given to you for any specific questions about what was found during the examination.  If the procedure report does not answer your questions, please call your gastroenterologist to clarify.  If you requested that your care partner not be given the details of your procedure findings, then the procedure report has been included in a sealed envelope for you to review at your convenience later.  YOU SHOULD EXPECT: Some feelings of bloating in the abdomen. Passage of more gas than usual.  Walking can help get rid of the air that was put into your GI tract during the procedure and reduce the bloating. If you had a lower endoscopy (such as a colonoscopy or flexible sigmoidoscopy) you may notice spotting of blood in your stool or on the toilet paper. If you underwent a bowel prep for your procedure, you may not have a normal bowel movement for a few days.  Please Note:  You might notice some irritation and congestion in your nose or some drainage.  This is from the oxygen used during your procedure.  There is no need for concern and it should clear up in a day or so.  SYMPTOMS TO REPORT IMMEDIATELY:   Following lower endoscopy (colonoscopy or flexible sigmoidoscopy):  Excessive amounts of blood in the stool  Significant tenderness or worsening of abdominal pains  Swelling of the abdomen that is new, acute  Fever of 100F or higher    For urgent or emergent issues, a gastroenterologist can be reached at any hour by calling [phone].   DIET:  We do recommend a small meal at first, but then you may proceed to your regular diet.  Drink plenty of fluids but you should avoid alcoholic beverages for 24 hours.  ACTIVITY:  You should plan to take it easy for the rest of today and you should NOT DRIVE or use heavy machinery until tomorrow (because of the sedation medicines used during the test).     FOLLOW UP: Our staff will call the number listed on your records 48-72 hours following your procedure to check on you and address any questions or concerns that you may have regarding the information given to you following your procedure. If we do not reach you, we will leave a message.  We will attempt to reach you two times.  During this call, we will ask if you have developed any symptoms of COVID 19. If you develop any symptoms (ie: fever, flu-like symptoms, shortness of breath, cough etc.) before then, please call [phone].  If you test positive for Covid 19 in the 2 weeks post procedure, please call and report this information to us.    If any biopsies were taken you will be contacted by phone or by letter within the next 1-3 weeks.  Please call us at [phone] if you have not heard about the biopsies in 3 weeks.    SIGNATURES/CONFIDENTIALITY: You and/or your care partner have signed paperwork which will be entered into your electronic medical record.  These signatures attest to the fact that that the information above on your After Visit Summary has been reviewed and is understood.  Full responsibility of the confidentiality of this discharge information lies with you and/or your care-partner.    Handout was given to you on polyps.  You may resume your current medications today. Await biopsy results. Please call if any questions   or concerns.   

## 2019-03-04 NOTE — Progress Notes (Signed)
History reviewed today  Temp JB, VS CW

## 2019-03-04 NOTE — Progress Notes (Signed)
No problems noted in the recovery room. maw 

## 2019-03-04 NOTE — Progress Notes (Signed)
Report to PACU, RN, vss, BBS= Clear.  

## 2019-03-06 ENCOUNTER — Telehealth: Payer: Self-pay

## 2019-03-06 ENCOUNTER — Telehealth: Payer: Self-pay | Admitting: *Deleted

## 2019-03-06 NOTE — Telephone Encounter (Signed)
LVM

## 2019-03-06 NOTE — Telephone Encounter (Signed)
  Follow up Call-  Call back number 03/04/2019  Post procedure Call Back phone  # [phone]  Permission to leave phone message Yes  Some recent data might be hidden     Patient questions:  Message left to call us if necessary.  Second call.

## 2019-03-10 ENCOUNTER — Other Ambulatory Visit: Payer: Self-pay | Admitting: *Deleted

## 2019-03-10 ENCOUNTER — Encounter: Payer: Self-pay | Admitting: *Deleted

## 2019-03-10 ENCOUNTER — Telehealth: Payer: Self-pay | Admitting: *Deleted

## 2019-03-10 ENCOUNTER — Encounter: Payer: Self-pay | Admitting: Gastroenterology

## 2019-03-10 DIAGNOSIS — D509 Iron deficiency anemia, unspecified: Secondary | ICD-10-CM

## 2019-03-10 NOTE — Telephone Encounter (Signed)
Spoke to the patient who is now scheduled for a VCE on 03/23/2019. The patient reported she would come tomorrow to pick up the prep instruction. Instructions printed along with pathology letter and reminder of appt to discuss VCE results placed at reception desk for retrieval.   Amb referral placed in [place].  Completed capsule endoscopy scheduling form placed in Amy's office.

## 2019-03-22 ENCOUNTER — Encounter: Payer: Self-pay | Admitting: Gastroenterology

## 2019-03-23 ENCOUNTER — Other Ambulatory Visit: Payer: Self-pay

## 2019-03-23 ENCOUNTER — Ambulatory Visit (INDEPENDENT_AMBULATORY_CARE_PROVIDER_SITE_OTHER): Payer: Medicare Other | Admitting: Gastroenterology

## 2019-03-23 DIAGNOSIS — D509 Iron deficiency anemia, unspecified: Secondary | ICD-10-CM

## 2019-03-23 NOTE — Progress Notes (Signed)
SN: [id number] Exp: 2020-06-02 LOT: 05-12-14  Patient arrived for VCE. Reported the prep went well. This RN explained capsule retrieval process and dietary restrictions for the next few hours. Patient verbalized understanding. Opened capsule, ensured capsule was flashing prior to the patient swallowing the capsule. Patient swallowed capsule without difficulty. Patient given retrieval supplies. Patient told to call the office with any questions and if no capsule was retrieved after 72 hours. No further questions by the conclusion of the visit.

## 2019-04-01 ENCOUNTER — Telehealth: Payer: Self-pay | Admitting: Gastroenterology

## 2019-04-01 NOTE — Telephone Encounter (Signed)
Attempted to call both the cell and the home phone numbers that were listed in the chart.   No answer, unable to LM with either number.

## 2019-04-01 NOTE — Telephone Encounter (Signed)
Please call the patient.  Her recent capsule study results have been interpreted.  There is one small AVM in the distal small bowel.  No other causes of anemia identified.  I have no new recommendations based on these results.  We will be able to review them during her upcoming office visit.  Thank you.

## 2019-04-01 NOTE — Telephone Encounter (Signed)
Capsule endoscopy interpreted. Done for iron deficiency anemia. One suspected very small AVM in the distal small bowel. Otherwise adequate prep without any significant abnormalities noted to cause anemia. Report to be scanned into Epic.  Bertram Millard, I believe this is your patient - capsule result. Thanks

## 2019-04-02 NOTE — Telephone Encounter (Signed)
Spoke to the patient and reported to the patient her VCE results. Verbalized understanding. Had no questions at the time. Will be in to see Dr. Tarri Glenn on 04/22/19 at 3:40 pm for her OV.

## 2019-04-02 NOTE — Telephone Encounter (Signed)
Attempted to call both the cell and the home phone numbers a second time that were listed in the chart.   No answer, unable to LM with either number.

## 2019-04-02 NOTE — Telephone Encounter (Signed)
Pt returned your call.  Please call her back at [phone].

## 2019-04-22 ENCOUNTER — Ambulatory Visit (INDEPENDENT_AMBULATORY_CARE_PROVIDER_SITE_OTHER): Payer: Medicare Other | Admitting: Gastroenterology

## 2019-04-22 ENCOUNTER — Encounter: Payer: Self-pay | Admitting: Gastroenterology

## 2019-04-22 ENCOUNTER — Other Ambulatory Visit (INDEPENDENT_AMBULATORY_CARE_PROVIDER_SITE_OTHER): Payer: Medicare Other

## 2019-04-22 VITALS — BP 114/80 | HR 92 | Temp 97.5°F | Ht 64.5 in | Wt 211.1 lb

## 2019-04-22 DIAGNOSIS — K2270 Barrett's esophagus without dysplasia: Secondary | ICD-10-CM

## 2019-04-22 DIAGNOSIS — Z7689 Persons encountering health services in other specified circumstances: Secondary | ICD-10-CM

## 2019-04-22 DIAGNOSIS — D509 Iron deficiency anemia, unspecified: Secondary | ICD-10-CM

## 2019-04-22 DIAGNOSIS — R1115 Cyclical vomiting syndrome unrelated to migraine: Secondary | ICD-10-CM | POA: Diagnosis not present

## 2019-04-22 LAB — IRON: Iron: 28 ug/dL — ABNORMAL LOW (ref 42–145)

## 2019-04-22 LAB — HEMOGLOBIN: Hemoglobin: 11.9 g/dL — ABNORMAL LOW (ref 12.0–15.0)

## 2019-04-22 LAB — FERRITIN: Ferritin: 5.4 ng/mL — ABNORMAL LOW (ref 10.0–291.0)

## 2019-04-22 NOTE — Progress Notes (Signed)
Referring Provider: Andreas Blower, * Primary Care Physician:  Andreas Blower, MD  Chief complaint:  Cyclical vomiting   IMPRESSION:  Iron deficiency with a non-diagnostic EGD, colonoscopy, and capsule endoscopy    - labs 12/27/18: iron 27, ferritin 7, saturation ratio 8, hgb 8.3, MCV 85.7, RDW 16.4 Barrett's esophagus on biopsy 04/28/15 Cyclical vomiting syndrome BMI 36 Family history of colon polyps (grandmother)  No known family history of colon cancer  Reviewed capsule endoscopy results.  No evidence for GI blood loss anemia contributing to iron deficiency.  We will repeat iron studies today.  Cyclic vomiting syndrome has improved on amitriptyline.  No change in management needed at this time.  Repeat EGD recommended in March to confirm the diagnosis of Barrett's.  We will continue daily proton pump inhibitor in the meantime.  Referral to [facility] at the patient's request.   PLAN: Continue amitriptyline to 50 mg QHS Continue omeprazole 20 mg QD Avoid all NSAIDs Hemoglobin, iron, ferritin Repeat EGD in March for Barrett's biopsies after 6 months of PPI therapy Referral to [facility]  Please see the "Patient Instructions" section for addition details about the plan.  HPI: [name] is a 48 y.o. female who returns in scheduled follow-up after her capsule endoscopy 03/22/19.  She has anxiety, arthritis, barrett's esophagus, depression, hypertension, IBS, obesity, endometriosis, seizures, and hemorrhoidectomy in 1999.  Previous suicide attempts, most recently 02/2015 with an intentional xanaflex overdose.  Microcytic anemia 11/2017 (Hgb 10.4, MCV 75).  I met her during her September 2020 hospitalization for nausea and vomiting.   She has a many year history of intermittent nausea/vomiting with associated abdominal cramping and occasionally headaches that is likely cyclic vomiting syndrome. No GI symptoms  between episodes. No identified triggers except for potentially spicy food. Frequent NSAIDs. No MJ. Has never seen a neurologist for her headaches. Previously evaluated by Dr. Odie Sera in 2016 with EGD, abdominal ultrasound, HIDA, and treated with PRN antiemetics. Liver enzymes and lipase are always normal.  CT scan in the ED 12/26/18 showed an incidental finding of Bochdalek hernia.  Her nausea and vomiting largely improved on low-dose amitryltine.  Also under evaluation for asymptomatic microscopic anemia. No fatigue, weakness, headache, irritability, exercise intolerance, exertional dyspnea, vertigo, or angina pectoris.  No pica.  No beeturia.  No hearing loss.   No overt GI blood loss. No melena, hematochezia, bright red blood per rectum. No epistaxis, vaginal bleeding, hemoptysis, or hematuria.    Most recent labs 01/06/2019: White count 13.2, hemoglobin 11.4, MCV 83.1, RDW 17.7, platelets 436, iron 27, ferritin 7  EGD while hospitalized for nausea and vomiting in September did not show a source of anemia.  Colonoscopy 03/04/2019 did not show a cause of anemia either.  It revealed 2 small tubular adenomas (ascending colon and IC valve) and suggested patchy rectal erythema.  Colon biopsies were normal.  Capsule endoscopy 03/22/19 was normal except for one possible AVM in the distal small bowel.   She stopped taking iron supplements prior to the capsule endoscopy and has not yet resumed them.  No GI complaints today. No overt bleeding. No nausea or vomiting.   Goal is to lose 35 pounds. Requesting help with diet changes.   Prior endoscopy: EGD 01/2015 with Dr. Odie Sera: Procedure note not found but pathology shows normal distal esophageal biopsies.  Serum H. pylori IgA negative. EGD 12/27/18: small hiatal hernia. Biopsies showed negative duodenum, reactive gastropathy without H pylori, and  Barrett's esophagus.  Colonoscopy 03/04/2019: 2 small tubular adenomas (ascending colon and IC valve)  and suggested patchy rectal erythema.  Colon biopsies were normal. Capsule endoscopy 03/22/19: one possible AVM in the distal small bowel  Past Medical History:  Diagnosis Date  . Anxiety   . Arthritis    "joints ache all over" (10/15/2014)  . Bulging lumbar disc   . Chronic lower back pain   . DDD (degenerative disc disease), cervical   . Depression   . Drug-seeking behavior   . Headache    "weekly" (10/15/2014)  . Hyperlipemia   . Hypertension   . PTSD (post-traumatic stress disorder)   . Skin cancer    "had them cut off my arms; don't know what kind"    Past Surgical History:  Procedure Laterality Date  . ABLATION ON ENDOMETRIOSIS  2008  . BIOPSY  12/27/2018   Procedure: BIOPSY;  Surgeon: Thornton Park, MD;  Location: [facility];  Service: Gastroenterology;;  . ESOPHAGOGASTRODUODENOSCOPY (EGD) WITH PROPOFOL N/A 12/27/2018   Procedure: ESOPHAGOGASTRODUODENOSCOPY (EGD) WITH PROPOFOL;  Surgeon: Thornton Park, MD;  Location: [facility];  Service: Gastroenterology;  Laterality: N/A;  . HEMORRHOID SURGERY  ~ 2002    Current Outpatient Medications  Medication Sig Dispense Refill  . acetaminophen (TYLENOL) 325 MG tablet Take 2 tablets (650 mg total) by mouth every 6 (six) hours as needed for mild pain (or Fever >/= 101).    Marland Kitchen amitriptyline (ELAVIL) 50 MG tablet Take 1 tablet (50 mg total) by mouth at bedtime. 30 tablet 3  . ferrous gluconate (FERGON) 324 MG tablet Take 1 tablet (324 mg total) by mouth 2 (two) times daily with a meal. 60 tablet 3  . FLUoxetine (PROZAC) 40 MG capsule Take 40 mg by mouth daily.     . hydrochlorothiazide (HYDRODIURIL) 25 MG tablet Take 25 mg by mouth daily.     Marland Kitchen lamoTRIgine (LAMICTAL) 150 MG tablet Take 150 mg by mouth 2 (two) times daily.    Marland Kitchen lisinopril (ZESTRIL) 10 MG tablet Take 10 mg by mouth daily.     Marland Kitchen omeprazole (PRILOSEC) 20 MG capsule Take 20 mg by mouth daily.    . ondansetron (ZOFRAN) 4 MG tablet Take 1 tablet (4 mg total) by  mouth every 6 (six) hours as needed for nausea. (Patient not taking: Reported on 03/04/2019) 20 tablet 0  . risperiDONE (RISPERDAL) 0.5 MG tablet Take 0.5 mg by mouth 2 (two) times daily.  2  . tiZANidine (ZANAFLEX) 4 MG tablet Take 8 mg by mouth 3 (three) times daily.     . traZODone (DESYREL) 150 MG tablet Take 150 mg by mouth at bedtime.      Current Facility-Administered Medications  Medication Dose Route Frequency Provider Last Rate Last Admin  . 0.9 %  sodium chloride infusion  500 mL Intravenous Once Thornton Park, MD        Allergies as of 04/22/2019 - Review Complete 03/04/2019  Allergen Reaction Noted  . Lactose intolerance (gi) Nausea Only 12/11/2014    Family History  Problem Relation Age of Onset  . Breast cancer Mother   . Diabetes Mother   . Breast cancer Maternal Grandmother   . Breast cancer Paternal Grandmother   . Colon polyps Paternal Grandmother   . Colon cancer Neg Hx   . Esophageal cancer Neg Hx   . Rectal cancer Neg Hx   . Stomach cancer Neg Hx     Social History   Socioeconomic History  . Marital status:  Married    Spouse name: Not on file  . Number of children: Not on file  . Years of education: Not on file  . Highest education level: Not on file  Occupational History  . Not on file  Tobacco Use  . Smoking status: Never Smoker  . Smokeless tobacco: Never Used  Substance and Sexual Activity  . Alcohol use: Not Currently    Comment: 10/15/2014 "I've drank before; nothing regular; don't drink now cause of RX I'm on"  . Drug use: No  . Sexual activity: Not Currently  Other Topics Concern  . Not on file  Social History Narrative  . Not on file   Social Determinants of Health   Financial Resource Strain:   . Difficulty of Paying Living Expenses: Not on file  Food Insecurity:   . Worried About Charity fundraiser in the Last Year: Not on file  . Ran Out of Food in the Last Year: Not on file  Transportation Needs:   . Lack of  Transportation (Medical): Not on file  . Lack of Transportation (Non-Medical): Not on file  Physical Activity:   . Days of Exercise per Week: Not on file  . Minutes of Exercise per Session: Not on file  Stress:   . Feeling of Stress : Not on file  Social Connections:   . Frequency of Communication with Friends and Family: Not on file  . Frequency of Social Gatherings with Friends and Family: Not on file  . Attends Religious Services: Not on file  . Active Member of Clubs or Organizations: Not on file  . Attends Archivist Meetings: Not on file  . Marital Status: Not on file  Intimate Partner Violence:   . Fear of Current or Ex-Partner: Not on file  . Emotionally Abused: Not on file  . Physically Abused: Not on file  . Sexually Abused: Not on file    Physical Exam: General:   Alert,  well-nourished, pleasant and cooperative in NAD Head:  Normocephalic and atraumatic. Abdomen:  Central obesity, Soft,nontender, nondistended, normal bowel sounds, no rebound or guarding. No hepatosplenomegaly.   Msk:  Symmetrical. No boney deformities LAD: No inguinal or umbilical LAD Extremities:  No clubbing or edema. Neurologic:  Alert and  oriented x4;  grossly nonfocal Skin:  Intact without significant lesions or rashes. Psych:  Alert and cooperative. Normal mood and affect.     Jadalyn Oliveri L. Tarri Glenn, MD, MPH 04/22/2019, 9:45 AM

## 2019-04-22 NOTE — Patient Instructions (Addendum)
If you are age 48 or older, your body mass index should be between 23-30. Your Body mass index is 35.68 kg/m. If this is out of the aforementioned range listed, please consider follow up with your Primary Care Provider.  If you are age 35 or younger, your body mass index should be between 19-25. Your Body mass index is 35.68 kg/m. If this is out of the aformentioned range listed, please consider follow up with your Primary Care Provider.   Due to recent changes in healthcare laws, you may see the results of your imaging and laboratory studies on MyChart before your provider has had a chance to review them.  We understand that in some cases there may be results that are confusing or concerning to you. Not all laboratory results come back in the same time frame and the provider may be waiting for multiple results in order to interpret others.  Please give [place] 48 hours in order for your provider to thoroughly review all the results before contacting the office for clarification of your results.   Your provider has requested that you go to the basement level for lab work before leaving today. Press "B" on the elevator. The lab is located at the first door on the left as you exit the elevator.

## 2019-04-27 ENCOUNTER — Other Ambulatory Visit: Payer: Self-pay | Admitting: *Deleted

## 2019-04-27 DIAGNOSIS — D509 Iron deficiency anemia, unspecified: Secondary | ICD-10-CM

## 2019-05-05 ENCOUNTER — Inpatient Hospital Stay: Payer: Medicare Other

## 2019-05-05 ENCOUNTER — Encounter: Payer: Self-pay | Admitting: Internal Medicine

## 2019-05-05 ENCOUNTER — Inpatient Hospital Stay: Payer: Medicare Other | Attending: Internal Medicine | Admitting: Internal Medicine

## 2019-05-05 ENCOUNTER — Other Ambulatory Visit: Payer: Self-pay

## 2019-05-05 DIAGNOSIS — D649 Anemia, unspecified: Secondary | ICD-10-CM

## 2019-05-05 DIAGNOSIS — D509 Iron deficiency anemia, unspecified: Secondary | ICD-10-CM | POA: Insufficient documentation

## 2019-05-05 DIAGNOSIS — E785 Hyperlipidemia, unspecified: Secondary | ICD-10-CM | POA: Insufficient documentation

## 2019-05-05 DIAGNOSIS — G8929 Other chronic pain: Secondary | ICD-10-CM | POA: Diagnosis not present

## 2019-05-05 DIAGNOSIS — F329 Major depressive disorder, single episode, unspecified: Secondary | ICD-10-CM | POA: Diagnosis not present

## 2019-05-05 DIAGNOSIS — Z765 Malingerer [conscious simulation]: Secondary | ICD-10-CM | POA: Diagnosis not present

## 2019-05-05 DIAGNOSIS — Z85828 Personal history of other malignant neoplasm of skin: Secondary | ICD-10-CM | POA: Diagnosis not present

## 2019-05-05 DIAGNOSIS — R5383 Other fatigue: Secondary | ICD-10-CM | POA: Diagnosis not present

## 2019-05-05 DIAGNOSIS — E611 Iron deficiency: Secondary | ICD-10-CM | POA: Insufficient documentation

## 2019-05-05 DIAGNOSIS — I10 Essential (primary) hypertension: Secondary | ICD-10-CM | POA: Insufficient documentation

## 2019-05-05 DIAGNOSIS — Z79899 Other long term (current) drug therapy: Secondary | ICD-10-CM | POA: Insufficient documentation

## 2019-05-05 DIAGNOSIS — F4310 Post-traumatic stress disorder, unspecified: Secondary | ICD-10-CM | POA: Insufficient documentation

## 2019-05-05 DIAGNOSIS — M5030 Other cervical disc degeneration, unspecified cervical region: Secondary | ICD-10-CM | POA: Insufficient documentation

## 2019-05-05 LAB — CBC WITH DIFFERENTIAL/PLATELET
Abs Immature Granulocytes: 0.06 K/uL (ref 0.00–0.07)
Basophils Absolute: 0.1 K/uL (ref 0.0–0.1)
Basophils Relative: 1 %
Eosinophils Absolute: 0.3 K/uL (ref 0.0–0.5)
Eosinophils Relative: 4 %
HCT: 35.2 % — ABNORMAL LOW (ref 36.0–46.0)
Hemoglobin: 10.7 g/dL — ABNORMAL LOW (ref 12.0–15.0)
Immature Granulocytes: 1 %
Lymphocytes Relative: 25 %
Lymphs Abs: 2 K/uL (ref 0.7–4.0)
MCH: 27.2 pg (ref 26.0–34.0)
MCHC: 30.4 g/dL (ref 30.0–36.0)
MCV: 89.6 fL (ref 80.0–100.0)
Monocytes Absolute: 0.5 K/uL (ref 0.1–1.0)
Monocytes Relative: 7 %
Neutro Abs: 5.1 K/uL (ref 1.7–7.7)
Neutrophils Relative %: 62 %
Platelets: 330 K/uL (ref 150–400)
RBC: 3.93 MIL/uL (ref 3.87–5.11)
RDW: 15 % (ref 11.5–15.5)
WBC: 8.1 K/uL (ref 4.0–10.5)
nRBC: 0 % (ref 0.0–0.2)

## 2019-05-05 LAB — COMPREHENSIVE METABOLIC PANEL
ALT: 14 U/L (ref 0–44)
AST: 15 U/L (ref 15–41)
Albumin: 3.6 g/dL (ref 3.5–5.0)
Alkaline Phosphatase: 115 U/L (ref 38–126)
Anion gap: 11 (ref 5–15)
BUN: 16 mg/dL (ref 6–20)
CO2: 22 mmol/L (ref 22–32)
Calcium: 8.8 mg/dL — ABNORMAL LOW (ref 8.9–10.3)
Chloride: 102 mmol/L (ref 98–111)
Creatinine, Ser: 1.04 mg/dL — ABNORMAL HIGH (ref 0.44–1.00)
GFR calc Af Amer: >60 mL/min (ref >60)
GFR calc non Af Amer: >60 mL/min (ref >60)
Glucose, Bld: 136 mg/dL — ABNORMAL HIGH (ref 70–99)
Potassium: 3.9 mmol/L (ref 3.5–5.1)
Sodium: 135 mmol/L (ref 135–145)
Total Bilirubin: 0.3 mg/dL (ref 0.3–1.2)
Total Protein: 7.3 g/dL (ref 6.5–8.1)

## 2019-05-05 LAB — FOLATE: Folate: 21.5 ng/mL (ref >5.9)

## 2019-05-05 LAB — VITAMIN B12: Vitamin B-12: 214 pg/mL (ref 180–914)

## 2019-05-05 LAB — LACTATE DEHYDROGENASE: LDH: 104 U/L (ref 98–192)

## 2019-05-05 NOTE — Assessment & Plan Note (Addendum)
#  Severe iron deficiency-ferritin-5 saturation 5; hemoglobin ~10.  Symptomatic with extreme fatigue.  Recommend IV Venofer.  Continue p.o. iron.  Discussed the potential acute infusion reactions with IV iron; which are quite rare.  Patient understands the risk; will proceed with infusions.  # Etiology-of iron deficient is unclear.-Patient has had extensive GI work-up including capsule study negative for any active lesions.  Question malabsorption.  Plan as above  # DISPOSITION: # labs- cbc/bmp/LDH # Venofer weekly x3; start next week # follow up 2 months- MD;labs- cbc; possible venofer-Dr.B

## 2019-05-05 NOTE — Progress Notes (Signed)
[facility] CONSULT NOTE  Patient Care Team: Andreas Blower, MD as PCP - General (Family Medicine)  CHIEF COMPLAINTS/PURPOSE OF CONSULTATION:    HEMATOLOGY HISTORY  # IRON DEFICIENCY ANEMIA: ? ETIOLOGY; AUG 2020- CT A/P- No hepatosplenomegaly//no kidney/bladder lesions;  NOV 2020- EGD/ colonoscopy- polyps [Dr.Beibers; [facility]; [place]]; capsule-? AVM; otherwise negative.[id number]- Venofer [C]  # hx of PTSD/chronic pain  HISTORY OF PRESENTING ILLNESS:  [name] 49 y.o.  female has been referred to [place] for further evaluation/work-up for anemia.  Patient complains of worsening fatigue over the last many months.  Patient has been on p.o. iron for the last 2 weeks.  Patient has had extensive work-up for GI causes including EGD colonoscopy/capsule study as above.  No obvious cause noted for her bleeding.  CT abdomen pelvis August 2020-negative.  Blood in stools: None Change in bowel habits- None Blood in urine: None Difficulty swallowing: None Abnormal weight loss: None Iron supplementation: yes- BID.  Prior Blood transfusions: NO PRBC transfusion Vaginal bleeding: None/post menopasusal   Review of Systems  Constitutional: Positive for malaise/fatigue. Negative for chills, diaphoresis, fever and weight loss.  HENT: Negative for nosebleeds and sore throat.   Eyes: Negative for double vision.  Respiratory: Negative for cough, hemoptysis, sputum production, shortness of breath and wheezing.   Cardiovascular: Negative for chest pain, palpitations, orthopnea and leg swelling.  Gastrointestinal: Negative for abdominal pain, blood in stool, constipation, diarrhea, heartburn, melena, nausea and vomiting.  Genitourinary: Negative for dysuria, frequency and urgency.  Musculoskeletal: Negative for back pain and joint pain.  Skin: Negative.  Negative for itching and rash.  Neurological: Negative for dizziness, tingling, focal weakness, weakness and headaches.   Endo/Heme/Allergies: Does not bruise/bleed easily.  Psychiatric/Behavioral: Positive for depression. The patient is nervous/anxious and has insomnia.     MEDICAL HISTORY:  Past Medical History:  Diagnosis Date  . Anxiety   . Arthritis    "joints ache all over" (10/15/2014)  . Bulging lumbar disc   . Chronic lower back pain   . DDD (degenerative disc disease), cervical   . Depression   . Drug-seeking behavior   . Headache    "weekly" (10/15/2014)  . Hyperlipemia   . Hypertension   . PTSD (post-traumatic stress disorder)   . Skin cancer    "had them cut off my arms; don't know what kind"    SURGICAL HISTORY: Past Surgical History:  Procedure Laterality Date  . ABLATION ON ENDOMETRIOSIS  2008  . BIOPSY  12/27/2018   Procedure: BIOPSY;  Surgeon: Thornton Park, MD;  Location: [facility];  Service: Gastroenterology;;  . ESOPHAGOGASTRODUODENOSCOPY (EGD) WITH PROPOFOL N/A 12/27/2018   Procedure: ESOPHAGOGASTRODUODENOSCOPY (EGD) WITH PROPOFOL;  Surgeon: Thornton Park, MD;  Location: [facility];  Service: Gastroenterology;  Laterality: N/A;  . HEMORRHOID SURGERY  ~ 2002    SOCIAL HISTORY: Social History   Socioeconomic History  . Marital status: Married    Spouse name: Not on file  . Number of children: Not on file  . Years of education: Not on file  . Highest education level: Not on file  Occupational History  . Not on file  Tobacco Use  . Smoking status: Never Smoker  . Smokeless tobacco: Never Used  Substance and Sexual Activity  . Alcohol use: Not Currently    Comment: 10/15/2014 "I've drank before; nothing regular; don't drink now cause of RX I'm on"  . Drug use: No  . Sexual activity: Not Currently  Other Topics Concern  . Not  on file  Social History Narrative   [place] with husband. Never smoked; quit alcohol 10 years; used to work for [employer]. NO IV drug abuse.    Social Determinants of Health   Financial  Resource Strain:   . Difficulty of Paying Living Expenses: Not on file  Food Insecurity:   . Worried About Charity fundraiser in the Last Year: Not on file  . Ran Out of Food in the Last Year: Not on file  Transportation Needs:   . Lack of Transportation (Medical): Not on file  . Lack of Transportation (Non-Medical): Not on file  Physical Activity:   . Days of Exercise per Week: Not on file  . Minutes of Exercise per Session: Not on file  Stress:   . Feeling of Stress : Not on file  Social Connections:   . Frequency of Communication with Friends and Family: Not on file  . Frequency of Social Gatherings with Friends and Family: Not on file  . Attends Religious Services: Not on file  . Active Member of Clubs or Organizations: Not on file  . Attends Archivist Meetings: Not on file  . Marital Status: Not on file  Intimate Partner Violence:   . Fear of Current or Ex-Partner: Not on file  . Emotionally Abused: Not on file  . Physically Abused: Not on file  . Sexually Abused: Not on file    FAMILY HISTORY: Family History  Problem Relation Age of Onset  . Breast cancer Mother   . Diabetes Mother   . Breast cancer Maternal Grandmother   . Breast cancer Paternal Grandmother   . Colon polyps Paternal Grandmother   . Colon cancer Neg Hx   . Esophageal cancer Neg Hx   . Rectal cancer Neg Hx   . Stomach cancer Neg Hx     ALLERGIES:  is allergic to lactose intolerance (gi).  MEDICATIONS:  Current Outpatient Medications  Medication Sig Dispense Refill  . acetaminophen (TYLENOL) 325 MG tablet Take 2 tablets (650 mg total) by mouth every 6 (six) hours as needed for mild pain (or Fever >/= 101).    Marland Kitchen amitriptyline (ELAVIL) 50 MG tablet Take 1 tablet (50 mg total) by mouth at bedtime. 30 tablet 3  . FLUoxetine (PROZAC) 40 MG capsule Take 40 mg by mouth daily.     . hydrochlorothiazide (HYDRODIURIL) 25 MG tablet Take 25 mg by mouth daily.     Marland Kitchen lamoTRIgine (LAMICTAL) 150 MG  tablet Take 150 mg by mouth 2 (two) times daily.    Marland Kitchen lisinopril (ZESTRIL) 10 MG tablet Take 10 mg by mouth daily.     Marland Kitchen omeprazole (PRILOSEC) 20 MG capsule Take 20 mg by mouth daily.    . ondansetron (ZOFRAN) 4 MG tablet Take 1 tablet (4 mg total) by mouth every 6 (six) hours as needed for nausea. 20 tablet 0  . risperiDONE (RISPERDAL) 0.5 MG tablet Take 0.5 mg by mouth 2 (two) times daily.  2  . tiZANidine (ZANAFLEX) 4 MG tablet Take 8 mg by mouth 3 (three) times daily.     . traZODone (DESYREL) 150 MG tablet Take 150 mg by mouth at bedtime.      Current Facility-Administered Medications  Medication Dose Route Frequency Provider Last Rate Last Admin  . 0.9 %  sodium chloride infusion  500 mL Intravenous Once Thornton Park, MD          PHYSICAL EXAMINATION:   Vitals:  05/05/19 1128  BP: 103/68  Pulse: 65  Temp: (!) 96.8 F (36 C)   Filed Weights   05/05/19 1128  Weight: 213 lb (96.6 kg)    Physical Exam  Constitutional: She is oriented to person, place, and time and well-developed, well-nourished, and in no distress.  Accompanied by family.  HENT:  Head: Normocephalic and atraumatic.  Mouth/Throat: Oropharynx is clear and moist. No oropharyngeal exudate.  Eyes: Pupils are equal, round, and reactive to light.  Cardiovascular: Normal rate and regular rhythm.  Pulmonary/Chest: Effort normal and breath sounds normal. No respiratory distress. She has no wheezes.  Abdominal: Soft. Bowel sounds are normal. She exhibits no distension and no mass. There is no abdominal tenderness. There is no rebound and no guarding.  Musculoskeletal:        General: No tenderness or edema. Normal range of motion.     Cervical back: Normal range of motion and neck supple.  Neurological: She is alert and oriented to person, place, and time.  Skin: Skin is warm.  Psychiatric: Affect normal.    LABORATORY DATA:  I have reviewed the data as listed Lab Results  Component Value Date   WBC  8.1 05/05/2019   HGB 10.7 (L) 05/05/2019   HCT 35.2 (L) 05/05/2019   MCV 89.6 05/05/2019   PLT 330 05/05/2019   Recent Labs    12/25/18 1511 12/26/18 0812 12/27/18 0544 01/06/19 1634  NA 141 141 140 142  K 3.6 2.9* 3.6 3.3*  CL 106 107 108 104  CO2 18* 25 22 20*  GLUCOSE 158* 108* 85 178*  BUN 14 10 8 14   CREATININE 1.36* 0.90 0.88 1.24*  CALCIUM 9.6 8.6* 8.3* 9.9  GFRNONAA 46* >60 >60 52*  GFRAA 54* >60 >60 60*  PROT 9.1* 7.7  --  8.9*  ALBUMIN 4.6 3.9  --  4.6  AST 21 15  --  23  ALT 15 13  --  17  ALKPHOS 90 66  --  79  BILITOT 0.6 0.5  --  0.5     No results found.  Iron deficiency #Severe iron deficiency-ferritin-5 saturation 5; hemoglobin ~10.  Symptomatic with extreme fatigue.  Recommend IV Venofer.  Continue p.o. iron.  Discussed the potential acute infusion reactions with IV iron; which are quite rare.  Patient understands the risk; will proceed with infusions.  # Etiology-of iron deficient is unclear.-Patient has had extensive GI work-up including capsule study negative for any active lesions.  Question malabsorption.  Plan as above  # DISPOSITION: # labs- cbc/bmp/LDH # Venofer weekly x3; start next week # follow up 2 months- MD;labs- cbc; possible venofer-Dr.B  All questions were answered. The patient knows to call the clinic with any problems, questions or concerns.    Cammie Sickle, MD 05/05/2019 12:28 PM

## 2019-05-12 ENCOUNTER — Other Ambulatory Visit: Payer: Self-pay

## 2019-05-13 ENCOUNTER — Other Ambulatory Visit: Payer: Self-pay

## 2019-05-13 ENCOUNTER — Inpatient Hospital Stay: Payer: Medicare Other

## 2019-05-13 VITALS — BP 118/80 | HR 86 | Temp 97.4°F | Resp 18

## 2019-05-13 DIAGNOSIS — G8929 Other chronic pain: Secondary | ICD-10-CM | POA: Diagnosis not present

## 2019-05-13 DIAGNOSIS — F4310 Post-traumatic stress disorder, unspecified: Secondary | ICD-10-CM | POA: Diagnosis not present

## 2019-05-13 DIAGNOSIS — R5383 Other fatigue: Secondary | ICD-10-CM | POA: Diagnosis not present

## 2019-05-13 DIAGNOSIS — M5030 Other cervical disc degeneration, unspecified cervical region: Secondary | ICD-10-CM | POA: Diagnosis not present

## 2019-05-13 DIAGNOSIS — D509 Iron deficiency anemia, unspecified: Secondary | ICD-10-CM | POA: Diagnosis not present

## 2019-05-13 DIAGNOSIS — E611 Iron deficiency: Secondary | ICD-10-CM

## 2019-05-13 DIAGNOSIS — Z79899 Other long term (current) drug therapy: Secondary | ICD-10-CM | POA: Diagnosis not present

## 2019-05-13 MED ORDER — IRON SUCROSE 20 MG/ML IV SOLN
200.0000 mg | Freq: Once | INTRAVENOUS | Status: AC
  Administered 2019-05-13: 200 mg via INTRAVENOUS
  Filled 2019-05-13: qty 10

## 2019-05-13 MED ORDER — SODIUM CHLORIDE 0.9 % IV SOLN
Freq: Once | INTRAVENOUS | Status: AC
  Filled 2019-05-13: qty 250

## 2019-05-20 ENCOUNTER — Inpatient Hospital Stay: Payer: Medicare Other

## 2019-05-20 ENCOUNTER — Other Ambulatory Visit: Payer: Self-pay

## 2019-05-20 VITALS — BP 92/65 | HR 82 | Resp 18

## 2019-05-20 DIAGNOSIS — R5383 Other fatigue: Secondary | ICD-10-CM | POA: Diagnosis not present

## 2019-05-20 DIAGNOSIS — E611 Iron deficiency: Secondary | ICD-10-CM

## 2019-05-20 DIAGNOSIS — M5030 Other cervical disc degeneration, unspecified cervical region: Secondary | ICD-10-CM | POA: Diagnosis not present

## 2019-05-20 DIAGNOSIS — D509 Iron deficiency anemia, unspecified: Secondary | ICD-10-CM | POA: Diagnosis not present

## 2019-05-20 DIAGNOSIS — F4310 Post-traumatic stress disorder, unspecified: Secondary | ICD-10-CM | POA: Diagnosis not present

## 2019-05-20 DIAGNOSIS — Z79899 Other long term (current) drug therapy: Secondary | ICD-10-CM | POA: Diagnosis not present

## 2019-05-20 DIAGNOSIS — G8929 Other chronic pain: Secondary | ICD-10-CM | POA: Diagnosis not present

## 2019-05-20 MED ORDER — IRON SUCROSE 20 MG/ML IV SOLN
200.0000 mg | Freq: Once | INTRAVENOUS | Status: AC
  Administered 2019-05-20: 15:00:00 200 mg via INTRAVENOUS
  Filled 2019-05-20: qty 10

## 2019-05-20 MED ORDER — SODIUM CHLORIDE 0.9 % IV SOLN
Freq: Once | INTRAVENOUS | Status: AC
  Filled 2019-05-20: qty 250

## 2019-05-21 ENCOUNTER — Other Ambulatory Visit: Payer: Self-pay | Admitting: Gastroenterology

## 2019-05-26 ENCOUNTER — Other Ambulatory Visit: Payer: Self-pay

## 2019-05-27 ENCOUNTER — Inpatient Hospital Stay: Payer: Medicare Other | Attending: Internal Medicine

## 2019-05-27 ENCOUNTER — Other Ambulatory Visit: Payer: Self-pay

## 2019-05-27 VITALS — BP 132/78 | HR 84 | Resp 18

## 2019-05-27 DIAGNOSIS — D509 Iron deficiency anemia, unspecified: Secondary | ICD-10-CM | POA: Diagnosis not present

## 2019-05-27 DIAGNOSIS — E611 Iron deficiency: Secondary | ICD-10-CM

## 2019-05-27 MED ORDER — IRON SUCROSE 20 MG/ML IV SOLN
200.0000 mg | Freq: Once | INTRAVENOUS | Status: AC
  Administered 2019-05-27: 15:00:00 200 mg via INTRAVENOUS
  Filled 2019-05-27: qty 10

## 2019-05-27 MED ORDER — SODIUM CHLORIDE 0.9 % IV SOLN
Freq: Once | INTRAVENOUS | Status: AC
  Filled 2019-05-27: qty 250

## 2019-06-21 ENCOUNTER — Other Ambulatory Visit: Payer: Self-pay | Admitting: Gastroenterology

## 2019-06-29 ENCOUNTER — Other Ambulatory Visit: Payer: Self-pay | Admitting: *Deleted

## 2019-06-29 DIAGNOSIS — E611 Iron deficiency: Secondary | ICD-10-CM

## 2019-06-30 ENCOUNTER — Inpatient Hospital Stay: Payer: Medicare Other | Attending: Internal Medicine

## 2019-06-30 ENCOUNTER — Other Ambulatory Visit: Payer: Self-pay

## 2019-06-30 ENCOUNTER — Inpatient Hospital Stay (HOSPITAL_BASED_OUTPATIENT_CLINIC_OR_DEPARTMENT_OTHER): Payer: Medicare Other | Admitting: Internal Medicine

## 2019-06-30 ENCOUNTER — Encounter: Payer: Self-pay | Admitting: Internal Medicine

## 2019-06-30 ENCOUNTER — Inpatient Hospital Stay: Payer: Medicare Other

## 2019-06-30 DIAGNOSIS — E611 Iron deficiency: Secondary | ICD-10-CM

## 2019-06-30 DIAGNOSIS — Z833 Family history of diabetes mellitus: Secondary | ICD-10-CM | POA: Insufficient documentation

## 2019-06-30 DIAGNOSIS — I10 Essential (primary) hypertension: Secondary | ICD-10-CM | POA: Insufficient documentation

## 2019-06-30 DIAGNOSIS — Z803 Family history of malignant neoplasm of breast: Secondary | ICD-10-CM | POA: Insufficient documentation

## 2019-06-30 DIAGNOSIS — Z85828 Personal history of other malignant neoplasm of skin: Secondary | ICD-10-CM | POA: Diagnosis not present

## 2019-06-30 DIAGNOSIS — Z79899 Other long term (current) drug therapy: Secondary | ICD-10-CM | POA: Diagnosis not present

## 2019-06-30 DIAGNOSIS — F4310 Post-traumatic stress disorder, unspecified: Secondary | ICD-10-CM | POA: Diagnosis not present

## 2019-06-30 DIAGNOSIS — D508 Other iron deficiency anemias: Secondary | ICD-10-CM | POA: Diagnosis not present

## 2019-06-30 DIAGNOSIS — E785 Hyperlipidemia, unspecified: Secondary | ICD-10-CM | POA: Diagnosis not present

## 2019-06-30 DIAGNOSIS — F329 Major depressive disorder, single episode, unspecified: Secondary | ICD-10-CM | POA: Insufficient documentation

## 2019-06-30 LAB — CBC WITH DIFFERENTIAL/PLATELET
Abs Immature Granulocytes: 0.04 10*3/uL (ref 0.00–0.07)
Basophils Absolute: 0 10*3/uL (ref 0.0–0.1)
Basophils Relative: 1 %
Eosinophils Absolute: 0.3 10*3/uL (ref 0.0–0.5)
Eosinophils Relative: 4 %
HCT: 40.1 % (ref 36.0–46.0)
Hemoglobin: 12.7 g/dL (ref 12.0–15.0)
Immature Granulocytes: 1 %
Lymphocytes Relative: 18 %
Lymphs Abs: 1.3 10*3/uL (ref 0.7–4.0)
MCH: 28.9 pg (ref 26.0–34.0)
MCHC: 31.7 g/dL (ref 30.0–36.0)
MCV: 91.1 fL (ref 80.0–100.0)
Monocytes Absolute: 0.5 10*3/uL (ref 0.1–1.0)
Monocytes Relative: 8 %
Neutro Abs: 4.9 10*3/uL (ref 1.7–7.7)
Neutrophils Relative %: 68 %
Platelets: 241 10*3/uL (ref 150–400)
RBC: 4.4 MIL/uL (ref 3.87–5.11)
RDW: 15 % (ref 11.5–15.5)
WBC: 7.1 10*3/uL (ref 4.0–10.5)
nRBC: 0 % (ref 0.0–0.2)

## 2019-06-30 NOTE — Progress Notes (Signed)
[facility] CONSULT NOTE  Patient Care Team: Andreas Blower, MD as PCP - General (Family Medicine)  CHIEF COMPLAINTS/PURPOSE OF CONSULTATION:    HEMATOLOGY HISTORY  # IRON DEFICIENCY ANEMIA: ? ETIOLOGY; AUG 2020- CT A/P- No hepatosplenomegaly//no kidney/bladder lesions;  NOV 2020- EGD/ colonoscopy- polyps [Dr.Beibers; [facility]; [place]]; capsule-? AVM; otherwise negative.[id number]- Venofer [C]; postmenopausal  # hx of PTSD/chronic pain  HISTORY OF PRESENTING ILLNESS:  [name] 49 y.o.  female history of iron deficient anemia of unclear etiology is here for follow-up.  Patient is currently status post 3 IV iron Venofer.  Patient also on p.o. iron.  Patient notes to improvement of energy levels-on IV iron.   Review of Systems  Constitutional: Positive for malaise/fatigue. Negative for chills, diaphoresis, fever and weight loss.  HENT: Negative for nosebleeds and sore throat.   Eyes: Negative for double vision.  Respiratory: Negative for cough, hemoptysis, sputum production, shortness of breath and wheezing.   Cardiovascular: Negative for chest pain, palpitations, orthopnea and leg swelling.  Gastrointestinal: Negative for abdominal pain, blood in stool, constipation, diarrhea, heartburn, melena, nausea and vomiting.  Genitourinary: Negative for dysuria, frequency and urgency.  Musculoskeletal: Negative for back pain and joint pain.  Skin: Negative.  Negative for itching and rash.  Neurological: Negative for dizziness, tingling, focal weakness, weakness and headaches.  Endo/Heme/Allergies: Does not bruise/bleed easily.  Psychiatric/Behavioral: Positive for depression. The patient is nervous/anxious and has insomnia.     MEDICAL HISTORY:  Past Medical History:  Diagnosis Date  . Anxiety   . Arthritis    "joints ache all over" (10/15/2014)  . Bulging lumbar disc   . Chronic lower back pain   . DDD (degenerative disc disease), cervical   . Depression   .  Drug-seeking behavior   . Headache    "weekly" (10/15/2014)  . Hyperlipemia   . Hypertension   . PTSD (post-traumatic stress disorder)   . Skin cancer    "had them cut off my arms; don't know what kind"    SURGICAL HISTORY: Past Surgical History:  Procedure Laterality Date  . ABLATION ON ENDOMETRIOSIS  2008  . BIOPSY  12/27/2018   Procedure: BIOPSY;  Surgeon: Thornton Park, MD;  Location: [facility];  Service: Gastroenterology;;  . ESOPHAGOGASTRODUODENOSCOPY (EGD) WITH PROPOFOL N/A 12/27/2018   Procedure: ESOPHAGOGASTRODUODENOSCOPY (EGD) WITH PROPOFOL;  Surgeon: Thornton Park, MD;  Location: [facility];  Service: Gastroenterology;  Laterality: N/A;  . HEMORRHOID SURGERY  ~ 2002    SOCIAL HISTORY: Social History   Socioeconomic History  . Marital status: Married    Spouse name: Not on file  . Number of children: Not on file  . Years of education: Not on file  . Highest education level: Not on file  Occupational History  . Not on file  Tobacco Use  . Smoking status: Never Smoker  . Smokeless tobacco: Never Used  Substance and Sexual Activity  . Alcohol use: Not Currently    Comment: 10/15/2014 "I've drank before; nothing regular; don't drink now cause of RX I'm on"  . Drug use: No  . Sexual activity: Not Currently  Other Topics Concern  . Not on file  Social History Narrative   [place] with husband. Never smoked; quit alcohol 10 years; used to work for [employer]. NO IV drug abuse.    Social Determinants of Health   Financial Resource Strain:   . Difficulty of Paying Living Expenses: Not on file  Food Insecurity:   .  Worried About Charity fundraiser in the Last Year: Not on file  . Ran Out of Food in the Last Year: Not on file  Transportation Needs:   . Lack of Transportation (Medical): Not on file  . Lack of Transportation (Non-Medical): Not on file  Physical Activity:   . Days of Exercise per Week: Not on file  .  Minutes of Exercise per Session: Not on file  Stress:   . Feeling of Stress : Not on file  Social Connections:   . Frequency of Communication with Friends and Family: Not on file  . Frequency of Social Gatherings with Friends and Family: Not on file  . Attends Religious Services: Not on file  . Active Member of Clubs or Organizations: Not on file  . Attends Archivist Meetings: Not on file  . Marital Status: Not on file  Intimate Partner Violence:   . Fear of Current or Ex-Partner: Not on file  . Emotionally Abused: Not on file  . Physically Abused: Not on file  . Sexually Abused: Not on file    FAMILY HISTORY: Family History  Problem Relation Age of Onset  . Breast cancer Mother   . Diabetes Mother   . Breast cancer Maternal Grandmother   . Breast cancer Paternal Grandmother   . Colon polyps Paternal Grandmother   . Colon cancer Neg Hx   . Esophageal cancer Neg Hx   . Rectal cancer Neg Hx   . Stomach cancer Neg Hx     ALLERGIES:  is allergic to lactose intolerance (gi).  MEDICATIONS:  Current Outpatient Medications  Medication Sig Dispense Refill  . acetaminophen (TYLENOL) 325 MG tablet Take 2 tablets (650 mg total) by mouth every 6 (six) hours as needed for mild pain (or Fever >/= 101).    Marland Kitchen amitriptyline (ELAVIL) 50 MG tablet TAKE 1 TABLET BY MOUTH AT BEDTIME 30 tablet 0  . FLUoxetine (PROZAC) 40 MG capsule Take 40 mg by mouth daily.     . hydrochlorothiazide (HYDRODIURIL) 25 MG tablet Take 25 mg by mouth daily.     Marland Kitchen lamoTRIgine (LAMICTAL) 150 MG tablet Take 150 mg by mouth 2 (two) times daily.    Marland Kitchen lisinopril (ZESTRIL) 10 MG tablet Take 10 mg by mouth daily.     Marland Kitchen omeprazole (PRILOSEC) 20 MG capsule Take 20 mg by mouth daily.    . ondansetron (ZOFRAN) 4 MG tablet Take 1 tablet (4 mg total) by mouth every 6 (six) hours as needed for nausea. 20 tablet 0  . risperiDONE (RISPERDAL) 0.5 MG tablet Take 0.5 mg by mouth 2 (two) times daily.  2  . tiZANidine  (ZANAFLEX) 4 MG tablet Take 8 mg by mouth 3 (three) times daily.     . traZODone (DESYREL) 150 MG tablet Take 150 mg by mouth at bedtime.      Current Facility-Administered Medications  Medication Dose Route Frequency Provider Last Rate Last Admin  . 0.9 %  sodium chloride infusion  500 mL Intravenous Once Thornton Park, MD          PHYSICAL EXAMINATION:   Vitals:   06/30/19 1331  BP: 108/77  Pulse: 88  Temp: (!) 97.1 F (36.2 C)   Filed Weights   06/30/19 1331  Weight: 213 lb 6.4 oz (96.8 kg)    Physical Exam  Constitutional: She is oriented to person, place, and time and well-developed, well-nourished, and in no distress.  Patient is alone.  HENT:  Head: Normocephalic and  atraumatic.  Mouth/Throat: Oropharynx is clear and moist. No oropharyngeal exudate.  Eyes: Pupils are equal, round, and reactive to light.  Cardiovascular: Normal rate and regular rhythm.  Pulmonary/Chest: Effort normal and breath sounds normal. No respiratory distress. She has no wheezes.  Abdominal: Soft. Bowel sounds are normal. She exhibits no distension and no mass. There is no abdominal tenderness. There is no rebound and no guarding.  Musculoskeletal:        General: No tenderness or edema. Normal range of motion.     Cervical back: Normal range of motion and neck supple.  Neurological: She is alert and oriented to person, place, and time.  Skin: Skin is warm.  Psychiatric: Affect normal.    LABORATORY DATA:  I have reviewed the data as listed Lab Results  Component Value Date   WBC 7.1 06/30/2019   HGB 12.7 06/30/2019   HCT 40.1 06/30/2019   MCV 91.1 06/30/2019   PLT 241 06/30/2019   Recent Labs    12/26/18 0812 12/26/18 0812 12/27/18 0544 01/06/19 1634 05/05/19 1207  NA 141   < > 140 142 135  K 2.9*   < > 3.6 3.3* 3.9  CL 107   < > 108 104 102  CO2 25   < > 22 20* 22  GLUCOSE 108*   < > 85 178* 136*  BUN 10   < > 8 14 16   CREATININE 0.90   < > 0.88 1.24* 1.04*   CALCIUM 8.6*   < > 8.3* 9.9 8.8*  GFRNONAA >60   < > >60 52* >60  GFRAA >60   < > >60 60* >60  PROT 7.7  --   --  8.9* 7.3  ALBUMIN 3.9  --   --  4.6 3.6  AST 15  --   --  23 15  ALT 13  --   --  17 14  ALKPHOS 66  --   --  79 115  BILITOT 0.5  --   --  0.5 0.3   < > = values in this interval not displayed.     No results found.  Iron deficiency #Severe iron deficiency-ferritin-5 saturation 5; hemoglobin ~10.  Symptomatic with extreme fatigue.  S/p  IV Venofer x3.   # Etiology-of iron deficient is unclear.-Patient has had extensive GI work-up including capsule study negative for any active lesions.  Question malabsorption.  Plan as above  # DISPOSITION: # No venofer today # follow up 3 months- MD;labs- cbc; possible venofer-Dr.B  All questions were answered. The patient knows to call the clinic with any problems, questions or concerns.    Cammie Sickle, MD 06/30/2019 1:51 PM

## 2019-06-30 NOTE — Assessment & Plan Note (Addendum)
#  Severe iron deficiency-ferritin-5 saturation 5; hemoglobin ~10.  Symptomatic with extreme fatigue.  S/p  IV Venofer x3.   # Etiology-of iron deficient is unclear.-Patient has had extensive GI work-up including capsule study negative for any active lesions.  Question malabsorption.  Plan as above  # DISPOSITION: # No venofer today # follow up 3 months- MD;labs- cbc; possible venofer-Dr.B

## 2019-07-23 ENCOUNTER — Other Ambulatory Visit: Payer: Self-pay | Admitting: Gastroenterology

## 2019-09-29 ENCOUNTER — Inpatient Hospital Stay: Payer: Medicare Other

## 2019-09-29 ENCOUNTER — Inpatient Hospital Stay: Payer: Medicare Other | Admitting: Internal Medicine

## 2019-09-29 ENCOUNTER — Inpatient Hospital Stay: Payer: Medicare Other | Attending: Internal Medicine

## 2020-01-12 DIAGNOSIS — Z20828 Contact with and (suspected) exposure to other viral communicable diseases: Secondary | ICD-10-CM | POA: Diagnosis not present

## 2020-03-05 IMAGING — CT CT ABD-PELV W/ CM
2 of 5 series · 16 of 46 positions shown, 18 images · IV contrast (OMNIPAQUE 300)
Comparison: 11/28/2018

CLINICAL DATA: Abdominal pain suspected diverticulitis, nausea,
vomiting, umbilical and LEFT lower quadrant abdominal pain,
leukocytosis, leuko urea, negative beta HCG, hypertension

EXAM:
CT ABDOMEN AND PELVIS WITH CONTRAST
TECHNIQUE: Multidetector CT imaging of the abdomen and pelvis was performed
using the standard protocol following bolus administration of
intravenous contrast. Sagittal and coronal MPR images reconstructed
from axial data set.
CONTRAST:  80mL OMNIPAQUE IOHEXOL 300 MG/ML SOLN IV. No oral
contrast.

[Series 2: axial st · axial · 0.68mm/px · z∈[+1164,+1599]mm · 13 of 99 slices shown, 15 images]
[im 6/99  soft-tissue]
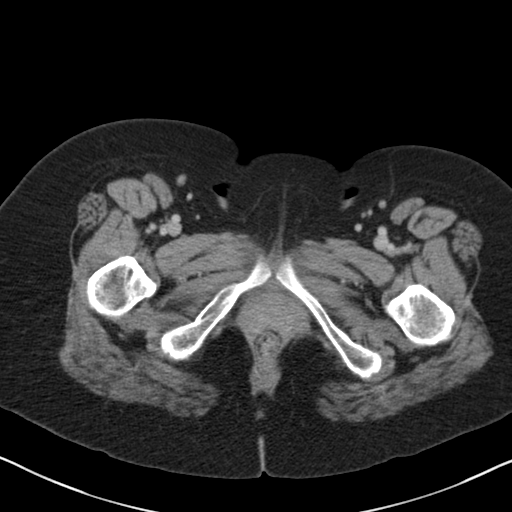
[im 6/99  bone]
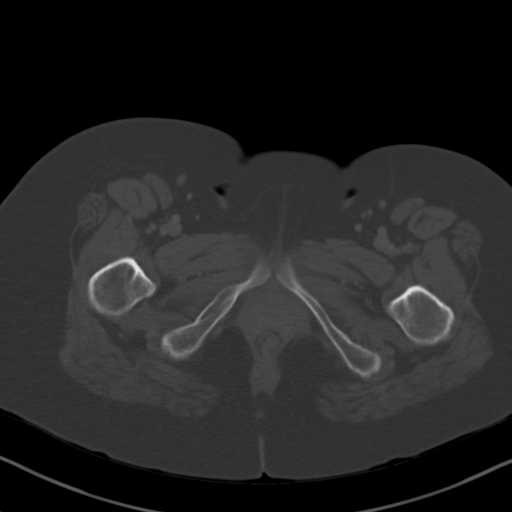
[im 12/99  soft-tissue]
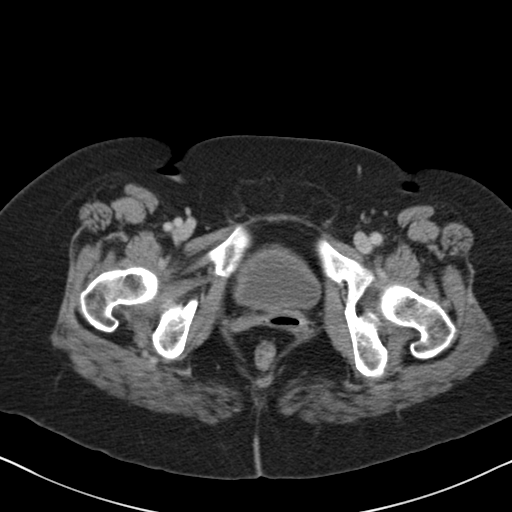
[im 24/99  soft-tissue]
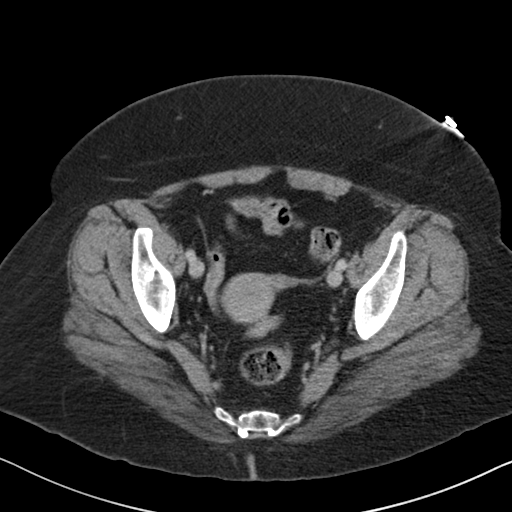
[im 29/99  soft-tissue]
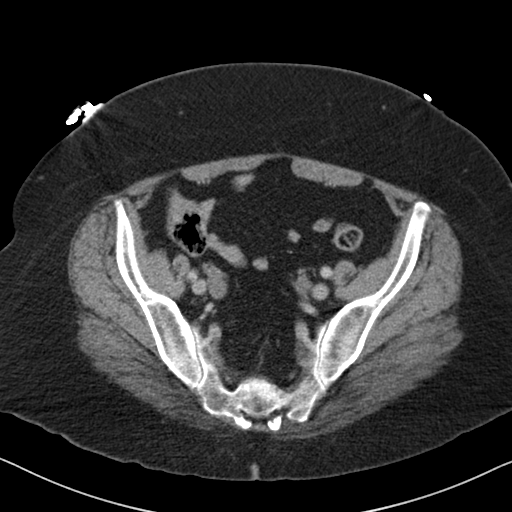
[im 35/99  soft-tissue]
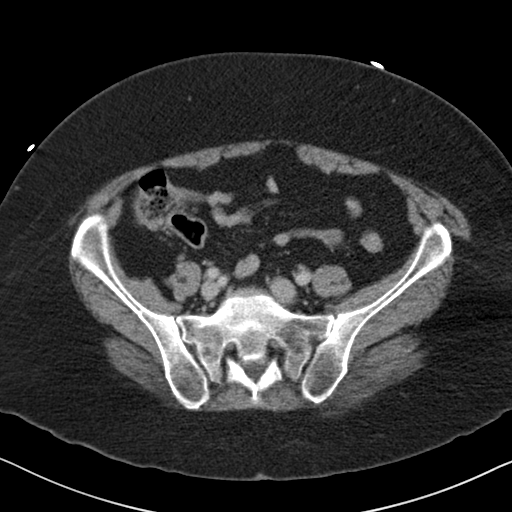
[im 41/99  soft-tissue]
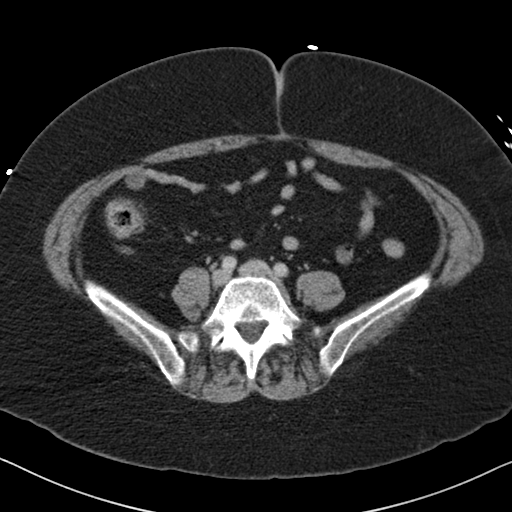
[im 52/99  soft-tissue]
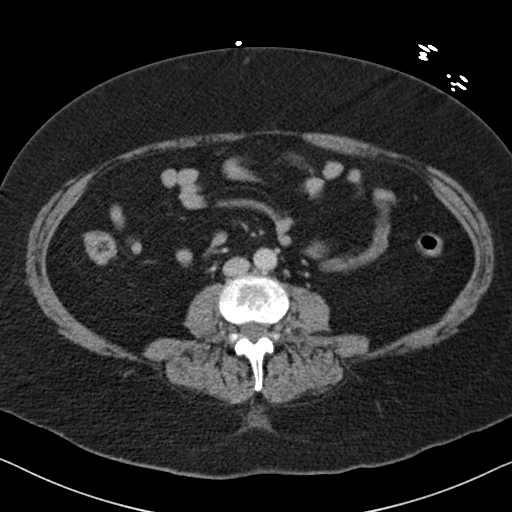
[im 58/99  soft-tissue]
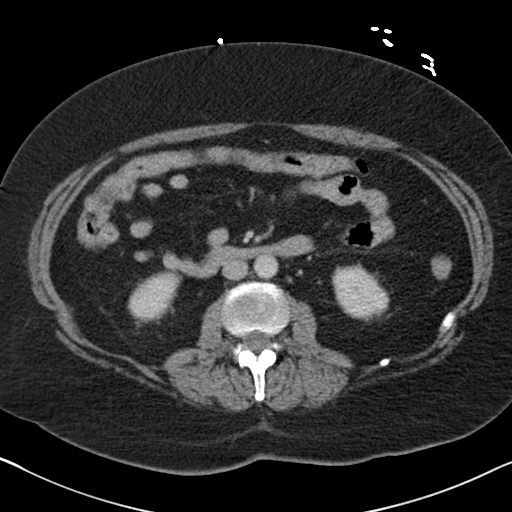
[im 64/99  soft-tissue]
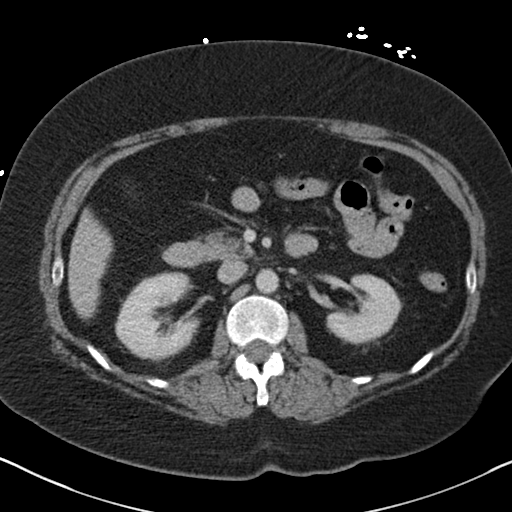
[im 64/99  bone]
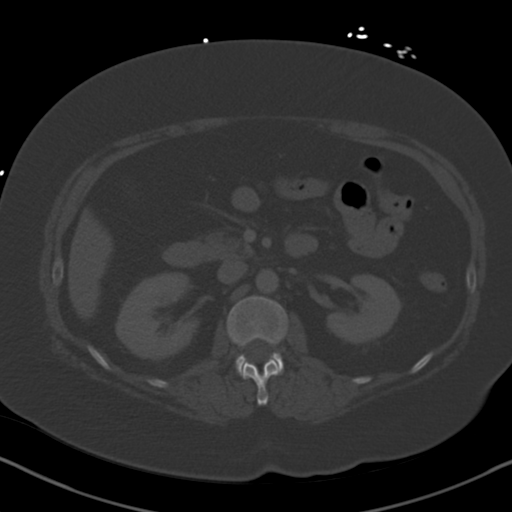
[im 70/99  soft-tissue]
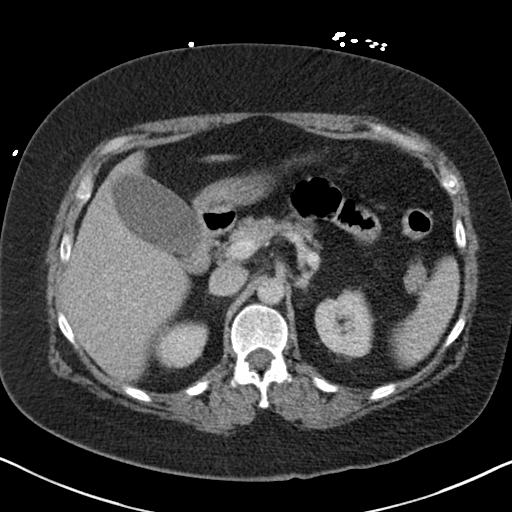
[im 75/99  soft-tissue]
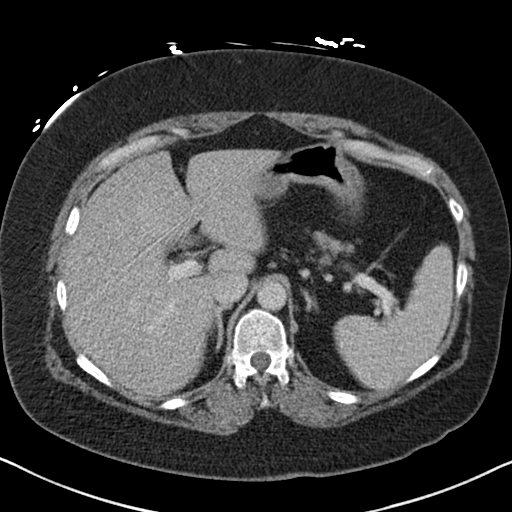
[im 87/99  soft-tissue]
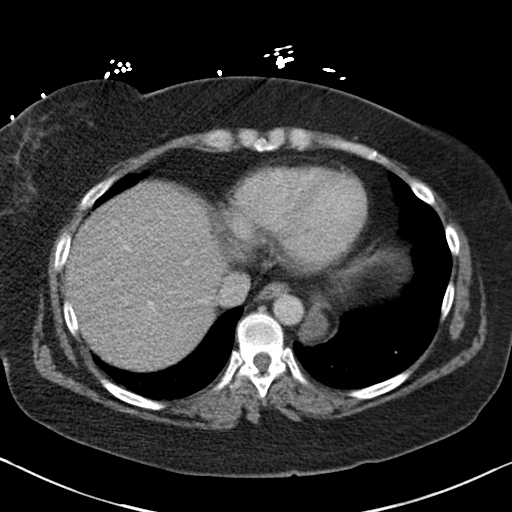
[im 93/99  soft-tissue]
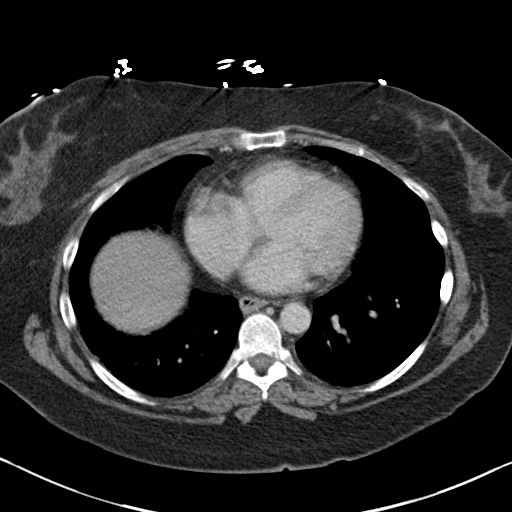

[Series 5: coronal st · coronal · 0.75mm/px · 3 of 143 slices shown]
[im 48/143  soft-tissue]
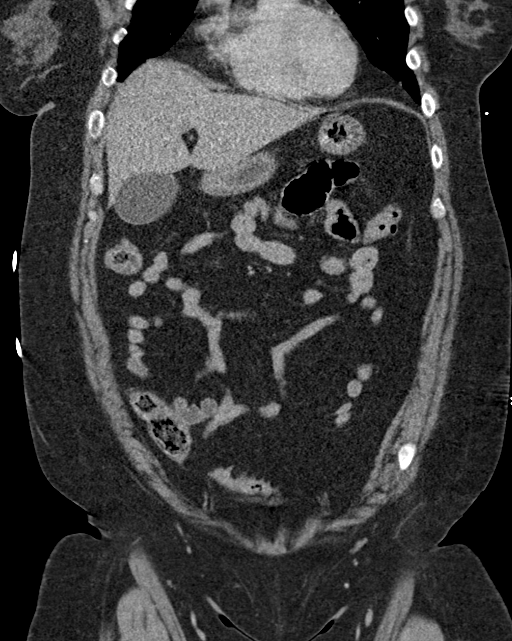
[im 64/143  soft-tissue]
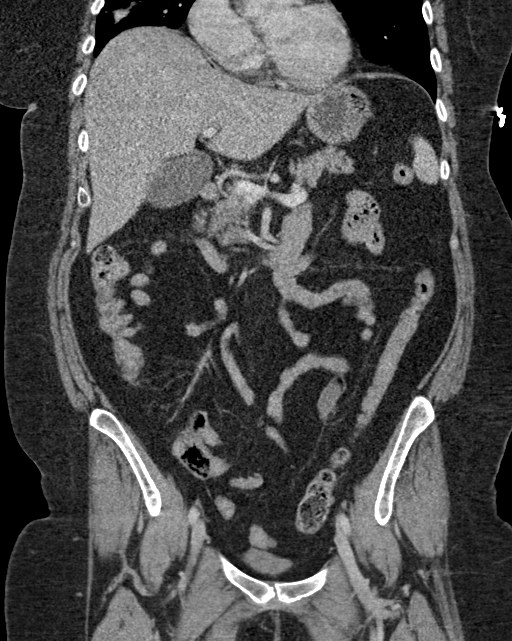
[im 79/143  soft-tissue]
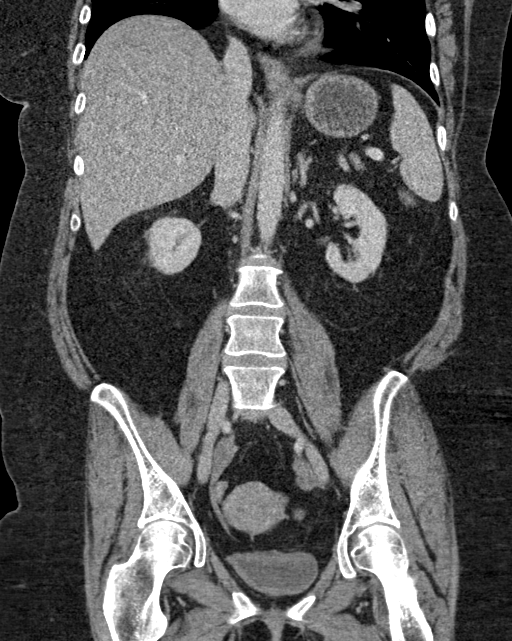

[16 of 46 positions shown; findings below may reference images not displayed]

FINDINGS: Lower chest: Subsegmental atelectasis RIGHT lung base. Posteromedial
LEFT diaphragmatic defect containing gastric fundus.

Hepatobiliary: Gallbladder and liver normal appearance

Pancreas: Normal appearance

Spleen: Normal appearance

Adrenals/Urinary Tract: Adrenal glands, kidneys, ureters, and
bladder normal appearance

Stomach/Bowel: Normal appendix, retrocecal. Stomach and bowel loops
otherwise normal appearance.

Vascular/Lymphatic: Vascular structures patent. Aorta normal
caliber. No adenopathy.

Reproductive: Normal appearing uterus and ovaries

Other: No free air or free fluid. No hernia or inflammatory process.
Old healed fracture of RIGHT inferior pubic ramus.

Musculoskeletal: Unremarkable
IMPRESSION: Bochdalek hernia at LEFT diaphragm containing gastric fundus.

No acute intra-abdominal or intrapelvic abnormalities.

## 2020-03-21 ENCOUNTER — Emergency Department (HOSPITAL_COMMUNITY): Payer: Medicare Other

## 2020-03-21 ENCOUNTER — Other Ambulatory Visit: Payer: Self-pay

## 2020-03-21 ENCOUNTER — Encounter (HOSPITAL_COMMUNITY): Payer: Self-pay | Admitting: Orthopedic Surgery

## 2020-03-21 ENCOUNTER — Emergency Department (HOSPITAL_COMMUNITY)
Admission: EM | Admit: 2020-03-21 | Discharge: 2020-03-21 | Disposition: A | Discharge status: Home / Self Care | Payer: Medicare Other | Attending: Emergency Medicine | Admitting: Emergency Medicine

## 2020-03-21 DIAGNOSIS — G319 Degenerative disease of nervous system, unspecified: Secondary | ICD-10-CM | POA: Diagnosis not present

## 2020-03-21 DIAGNOSIS — S82841A Displaced bimalleolar fracture of right lower leg, initial encounter for closed fracture: Secondary | ICD-10-CM | POA: Diagnosis not present

## 2020-03-21 DIAGNOSIS — S9304XA Dislocation of right ankle joint, initial encounter: Secondary | ICD-10-CM | POA: Diagnosis not present

## 2020-03-21 DIAGNOSIS — M5137 Other intervertebral disc degeneration, lumbosacral region: Secondary | ICD-10-CM | POA: Diagnosis not present

## 2020-03-21 DIAGNOSIS — E611 Iron deficiency: Secondary | ICD-10-CM | POA: Diagnosis not present

## 2020-03-21 DIAGNOSIS — W19XXXA Unspecified fall, initial encounter: Secondary | ICD-10-CM | POA: Insufficient documentation

## 2020-03-21 DIAGNOSIS — Z20822 Contact with and (suspected) exposure to covid-19: Secondary | ICD-10-CM | POA: Diagnosis not present

## 2020-03-21 DIAGNOSIS — R52 Pain, unspecified: Secondary | ICD-10-CM | POA: Diagnosis not present

## 2020-03-21 DIAGNOSIS — S8251XA Displaced fracture of medial malleolus of right tibia, initial encounter for closed fracture: Secondary | ICD-10-CM | POA: Diagnosis not present

## 2020-03-21 DIAGNOSIS — M25551 Pain in right hip: Secondary | ICD-10-CM | POA: Diagnosis not present

## 2020-03-21 DIAGNOSIS — S99911A Unspecified injury of right ankle, initial encounter: Secondary | ICD-10-CM | POA: Diagnosis present

## 2020-03-21 DIAGNOSIS — J341 Cyst and mucocele of nose and nasal sinus: Secondary | ICD-10-CM | POA: Diagnosis not present

## 2020-03-21 DIAGNOSIS — F319 Bipolar disorder, unspecified: Secondary | ICD-10-CM | POA: Diagnosis not present

## 2020-03-21 DIAGNOSIS — S82831A Other fracture of upper and lower end of right fibula, initial encounter for closed fracture: Secondary | ICD-10-CM | POA: Diagnosis not present

## 2020-03-21 DIAGNOSIS — R519 Headache, unspecified: Secondary | ICD-10-CM | POA: Insufficient documentation

## 2020-03-21 DIAGNOSIS — T148XXA Other injury of unspecified body region, initial encounter: Secondary | ICD-10-CM

## 2020-03-21 DIAGNOSIS — S80919A Unspecified superficial injury of unspecified knee, initial encounter: Secondary | ICD-10-CM | POA: Diagnosis not present

## 2020-03-21 DIAGNOSIS — I10 Essential (primary) hypertension: Secondary | ICD-10-CM | POA: Diagnosis not present

## 2020-03-21 DIAGNOSIS — T1490XA Injury, unspecified, initial encounter: Secondary | ICD-10-CM

## 2020-03-21 DIAGNOSIS — S82851A Displaced trimalleolar fracture of right lower leg, initial encounter for closed fracture: Secondary | ICD-10-CM | POA: Diagnosis not present

## 2020-03-21 DIAGNOSIS — S82891A Other fracture of right lower leg, initial encounter for closed fracture: Secondary | ICD-10-CM | POA: Diagnosis not present

## 2020-03-21 DIAGNOSIS — S0993XA Unspecified injury of face, initial encounter: Secondary | ICD-10-CM | POA: Diagnosis not present

## 2020-03-21 LAB — RESP PANEL BY RT-PCR (FLU A&B, COVID) ARPGX2
Influenza A by PCR: NEGATIVE
Influenza B by PCR: NEGATIVE
SARS Coronavirus 2 by RT PCR: NEGATIVE

## 2020-03-21 MED ORDER — KETAMINE HCL 50 MG/5ML IJ SOSY
1.0000 mg/kg | PREFILLED_SYRINGE | Freq: Once | INTRAMUSCULAR | Status: DC
  Filled 2020-03-21: qty 10

## 2020-03-21 MED ORDER — FENTANYL CITRATE (PF) 100 MCG/2ML IJ SOLN
100.0000 ug | Freq: Once | INTRAMUSCULAR | Status: AC
  Administered 2020-03-21: 100 ug via INTRAMUSCULAR

## 2020-03-21 MED ORDER — MORPHINE SULFATE (PF) 4 MG/ML IV SOLN
4.0000 mg | Freq: Once | INTRAVENOUS | Status: AC
  Administered 2020-03-21: 4 mg via INTRAVENOUS
  Filled 2020-03-21: qty 1

## 2020-03-21 MED ORDER — HYDROCODONE-ACETAMINOPHEN 5-325 MG PO TABS
2.0000 | ORAL_TABLET | Freq: Once | ORAL | Status: AC
  Administered 2020-03-21: 2 via ORAL
  Filled 2020-03-21: qty 2

## 2020-03-21 MED ORDER — FENTANYL CITRATE (PF) 100 MCG/2ML IJ SOLN: 100.0000 ug | Freq: Once | INTRAMUSCULAR | Status: DC

## 2020-03-21 MED ORDER — PROPOFOL 10 MG/ML IV BOLUS
0.5000 mg/kg | Freq: Once | INTRAVENOUS | Status: DC
  Filled 2020-03-21: qty 20

## 2020-03-21 MED ORDER — ONDANSETRON HCL 4 MG/2ML IJ SOLN
4.0000 mg | Freq: Once | INTRAMUSCULAR | Status: AC
  Administered 2020-03-21: 4 mg via INTRAVENOUS
  Filled 2020-03-21: qty 2

## 2020-03-21 MED ORDER — SODIUM CHLORIDE 0.9 % IV BOLUS
1000.0000 mL | Freq: Once | INTRAVENOUS | Status: AC
  Administered 2020-03-21: 1000 mL via INTRAVENOUS

## 2020-03-21 MED ORDER — KETOROLAC TROMETHAMINE 15 MG/ML IJ SOLN
15.0000 mg | Freq: Once | INTRAMUSCULAR | Status: AC
  Administered 2020-03-21: 15 mg via INTRAVENOUS
  Filled 2020-03-21: qty 1

## 2020-03-21 MED ORDER — MORPHINE SULFATE (PF) 4 MG/ML IV SOLN: 4.0000 mg | Freq: Once | INTRAVENOUS | Status: AC

## 2020-03-21 MED ORDER — KETAMINE HCL 10 MG/ML IJ SOLN
1.0000 mg/kg | Freq: Once | INTRAMUSCULAR | Status: DC
Start: 2020-03-21 — End: 2020-03-21

## 2020-03-21 MED ORDER — FENTANYL CITRATE (PF) 100 MCG/2ML IJ SOLN
100.0000 ug | Freq: Once | INTRAMUSCULAR | Status: AC
  Administered 2020-03-21: 100 ug via INTRAMUSCULAR
  Filled 2020-03-21: qty 2

## 2020-03-21 MED ORDER — MORPHINE SULFATE (PF) 4 MG/ML IV SOLN
INTRAVENOUS | Status: AC
  Administered 2020-03-21: 4 mg via INTRAVENOUS
  Filled 2020-03-21: qty 1

## 2020-03-21 MED ORDER — PHENYLEPHRINE HCL (PRESSORS) 10 MG/ML IV SOLN
INTRAVENOUS | Status: AC | PRN
  Administered 2020-03-21: 70 mg

## 2020-03-21 MED ORDER — METHOCARBAMOL 500 MG PO TABS: 500.0000 mg | ORAL_TABLET | Freq: Two times a day (BID) | ORAL | 0 refills | Status: DC

## 2020-03-21 MED ORDER — PROPOFOL 10 MG/ML IV BOLUS
1.0000 mg/kg | Freq: Once | INTRAVENOUS | Status: DC
  Filled 2020-03-21: qty 20

## 2020-03-21 MED ORDER — OXYCODONE-ACETAMINOPHEN 5-325 MG PO TABS
1.0000 | ORAL_TABLET | Freq: Four times a day (QID) | ORAL | 0 refills | Status: DC | PRN
Start: 2020-03-21 — End: 2020-05-12

## 2020-03-21 MED ORDER — FENTANYL CITRATE (PF) 100 MCG/2ML IJ SOLN
100.0000 ug | Freq: Once | INTRAMUSCULAR | Status: DC
  Filled 2020-03-21: qty 2

## 2020-03-21 MED ORDER — PROPOFOL 1000 MG/100ML IV EMUL
INTRAVENOUS | Status: AC | PRN
  Administered 2020-03-21: 30 mg via INTRAVENOUS

## 2020-03-21 MED ORDER — KETAMINE HCL 10 MG/ML IJ SOLN
INTRAMUSCULAR | Status: AC | PRN
  Administered 2020-03-21: 100 mg via INTRAVENOUS

## 2020-03-21 NOTE — ED Provider Notes (Signed)
[facility] EMERGENCY DEPARTMENT Provider Note   CSN: [id number] Arrival date & time: 03/21/20  0211     History Chief Complaint  Patient presents with  . Fall    [name] is a 49 y.o. female with a history of cervical degenerative disc disease, chronic low back pain, HLD, HTN, drug-seeking behavior, and PTSD who presents the emergency department by EMS with a chief complaint of fall.  The patient states that she was going to get into bed when her right ankle twisted and gave out on her, causing her to fall.  She states that she hit her forehead on a dresser that "is now dented" before falling to the ground.  She is unsure of how she landed.  She denies LOC, nausea, vomiting.  She is endorsing constant, 10 out of 10, radiating, sharp and achy pain to her right knee, lower leg, right ankle, and right foot as well as pain to her forehead.  She denies chest pain, abdominal pain, shortness of breath, neck pain, back pain, numbness, weakness. EMS applied a splint, but no other treatment was administered prior to arrival.  She does not take blood thinners.  She is established with [facility].   The history is provided by the patient and medical records. No language interpreter was used.       Past Medical History:  Diagnosis Date  . Anxiety   . Arthritis    "joints ache all over" (10/15/2014)  . Bulging lumbar disc   . Chronic lower back pain   . DDD (degenerative disc disease), cervical   . Depression   . Drug-seeking behavior   . Headache    "weekly" (10/15/2014)  . Hyperlipemia   . Hypertension   . PTSD (post-traumatic stress disorder)   . Skin cancer    "had them cut off my arms; don't know what kind"    Patient Active Problem List   Diagnosis Date Noted  . Iron deficiency 05/05/2019  . Symptomatic anemia 05/05/2019  . Nausea & vomiting 12/26/2018  . Essential hypertension 12/26/2018  . ARF (acute renal failure) ([place]) 12/26/2018  .  Intractable nausea and vomiting 11/28/2018  . Bipolar affective disorder ([place]) 11/28/2018  . Acute gastroenteritis 11/19/2015  . C. difficile colitis   . SIRS (systemic inflammatory response syndrome) ([place]) 01/11/2015  . Cellulitis 01/03/2015  . Opiate withdrawal ([place]) 12/18/2014  . Herpes simplex virus type 1 (HSV-1) dermatitis   . Sacral fracture ([place]) 12/12/2014  . Leukocytosis 12/12/2014  . Chronic lower back pain 12/12/2014  . Sacral fracture, closed ([place]) 12/12/2014  . Nausea with vomiting   . Intractable abdominal pain 10/15/2014  . Abdominal pain 10/15/2014  . Hypokalemia 09/04/2014  . Sepsis ([place]) 09/01/2014  . Abdominal pain, generalized 09/01/2014  . PTSD (post-traumatic stress disorder) 09/01/2014  . Endometriosis 09/01/2014  . Sinus tachycardia 09/01/2014  . Lactic acidosis 09/01/2014  . Severe sepsis ([place]) 09/01/2014    Past Surgical History:  Procedure Laterality Date  . ABLATION ON ENDOMETRIOSIS  2008  . BIOPSY  12/27/2018   Procedure: BIOPSY;  Surgeon: Thornton Park, MD;  Location: [facility] ENDOSCOPY;  Service: Gastroenterology;;  . ESOPHAGOGASTRODUODENOSCOPY (EGD) WITH PROPOFOL N/A 12/27/2018   Procedure: ESOPHAGOGASTRODUODENOSCOPY (EGD) WITH PROPOFOL;  Surgeon: Thornton Park, MD;  Location: [facility] ENDOSCOPY;  Service: Gastroenterology;  Laterality: N/A;  . HEMORRHOID SURGERY  ~ 2002     OB History   No obstetric history on file.     Family History  Problem  Relation Age of Onset  . Breast cancer Mother   . Diabetes Mother   . Breast cancer Maternal Grandmother   . Breast cancer Paternal Grandmother   . Colon polyps Paternal Grandmother   . Colon cancer Neg Hx   . Esophageal cancer Neg Hx   . Rectal cancer Neg Hx   . Stomach cancer Neg Hx     Social History   Tobacco Use  . Smoking status: Never Smoker  . Smokeless tobacco: Never Used  Vaping Use  . Vaping Use: Never used  Substance Use Topics  . Alcohol use: Not Currently    Comment: 10/15/2014  "I've drank before; nothing regular; don't drink now cause of RX I'm on"  . Drug use: No    Home Medications Prior to Admission medications   Medication Sig Start Date End Date Taking? Authorizing Provider  acetaminophen (TYLENOL) 325 MG tablet Take 2 tablets (650 mg total) by mouth every 6 (six) hours as needed for mild pain (or Fever >/= 101). 12/28/18   Darrick Meigs, Marge Duncans, MD  amitriptyline (ELAVIL) 50 MG tablet TAKE 1 TABLET BY MOUTH AT BEDTIME 07/23/19   Thornton Park, MD  FLUoxetine (PROZAC) 40 MG capsule Take 40 mg by mouth daily.  06/26/17   [provider]  hydrochlorothiazide (HYDRODIURIL) 25 MG tablet Take 25 mg by mouth daily.  06/26/17   [provider]  lamoTRIgine (LAMICTAL) 150 MG tablet Take 150 mg by mouth 2 (two) times daily.    [provider]  lisinopril (ZESTRIL) 10 MG tablet Take 10 mg by mouth daily.     [provider]  omeprazole (PRILOSEC) 20 MG capsule Take 20 mg by mouth daily.    [provider]  ondansetron (ZOFRAN) 4 MG tablet Take 1 tablet (4 mg total) by mouth every 6 (six) hours as needed for nausea. 12/28/18   Oswald Hillock, MD  risperiDONE (RISPERDAL) 0.5 MG tablet Take 0.5 mg by mouth 2 (two) times daily. 03/03/18   [provider]  tiZANidine (ZANAFLEX) 4 MG tablet Take 8 mg by mouth 3 (three) times daily.     [provider]  traZODone (DESYREL) 150 MG tablet Take 150 mg by mouth at bedtime.  06/03/17   [provider]    Allergies    Lactose intolerance (gi)  Review of Systems   Review of Systems  Constitutional: Negative for activity change, chills, fatigue and fever.  HENT: Negative for congestion and sore throat.   Respiratory: Negative for shortness of breath and wheezing.   Cardiovascular: Negative for chest pain and palpitations.  Gastrointestinal: Negative for abdominal pain, diarrhea, nausea and vomiting.  Genitourinary: Negative for dysuria and urgency.  Musculoskeletal:  Positive for arthralgias, gait problem, joint swelling and myalgias. Negative for back pain, neck pain and neck stiffness.  Skin: Negative for color change, pallor, rash and wound.  Allergic/Immunologic: Negative for immunocompromised state.  Neurological: Positive for headaches. Negative for dizziness, seizures, syncope, weakness and numbness.  Psychiatric/Behavioral: Negative for confusion.    Physical Exam Updated Vital Signs BP 132/77   Pulse 88   Temp 98.1 F (36.7 C) (Oral)   Resp 14   SpO2 96%   Physical Exam Vitals and nursing note reviewed.  Constitutional:      General: She is not in acute distress.    Appearance: She is obese.     Comments: Crying out in pain  HENT:     Head: Normocephalic.     Comments: No  hematoma noted to the forehead. Eyes:     Conjunctiva/sclera: Conjunctivae normal.  Cardiovascular:     Rate and Rhythm: Normal rate and regular rhythm.     Heart sounds: No murmur heard.  No friction rub. No gallop.   Pulmonary:     Effort: Pulmonary effort is normal. No respiratory distress.     Breath sounds: No stridor. No wheezing, rhonchi or rales.  Chest:     Chest wall: No tenderness.  Abdominal:     General: There is no distension.     Palpations: Abdomen is soft. There is no mass.     Tenderness: There is no abdominal tenderness. There is no right CVA tenderness, left CVA tenderness, guarding or rebound.     Hernia: No hernia is present.  Musculoskeletal:     Cervical back: Neck supple.     Right lower leg: No edema.     Left lower leg: No edema.     Comments: Ecchymosis noted over the right ankle with associated swelling and an obvious deformity.  DP pulses are 2+.  She has good capillary refill.  She is able to independently move the digits of the right foot, but this is limited secondary to pain.  She is diffusely tender to palpation to the right ankle as well as to the right lower leg, foot, and right knee.  Range of motion of the right knee  deferred at this time secondary to pain in her lower leg.  Right hip is nontender.  Skin:    General: Skin is warm.     Findings: No rash.  Neurological:     Mental Status: She is alert.  Psychiatric:        Behavior: Behavior normal.     ED Results / Procedures / Treatments   Labs (all labs ordered are listed, but only abnormal results are displayed) Labs Reviewed  RESP PANEL BY RT-PCR (FLU A&B, COVID) [id number]    EKG None  Radiology DG Tibia/Fibula Right  Result Date: 03/21/2020 CLINICAL DATA:  Fall onto right lower extremity EXAM: RIGHT TIBIA AND FIBULA - 2 VIEW COMPARISON:  None. FINDINGS: Acute distal fibular and medial malleolus fractures with marked lateral displacement and disrupted ankle mortise. No mid or proximal leg fracture. IMPRESSION: 1. Displaced distal fibular and medial malleolus fractures with disrupted ankle mortise. 2. No proximal leg fracture. Electronically Signed   By: Monte Fantasia M.D.   On: 03/21/2020 04:23   DG Ankle 2 Views Right  Result Date: 03/21/2020 CLINICAL DATA:  Post reduction EXAM: RIGHT ANKLE - 2 VIEW COMPARISON:  Earlier today FINDINGS: Unchanged lateral and posterior subluxation of foot due to displaced distal fibular and medial malleolus fractures. A splint is now overlapping. IMPRESSION: Distal fibular and medial malleolus fractures with unchanged alignment and subluxation of the foot. Electronically Signed   By: Monte Fantasia M.D.   On: 03/21/2020 07:20   DG Ankle Complete Right  Result Date: 03/21/2020 CLINICAL DATA:  Fall at home EXAM: RIGHT ANKLE - COMPLETE 3+ VIEW COMPARISON:  None. FINDINGS: Oblique fracture through the distal fibula with lateral and posterior displacement by 100%. Avulsion type fracture of the medial malleolus with fragmentation and over 100% lateral displacement. Disrupted ankle mortise with marked lateral and posterior subluxation. IMPRESSION: Distal fibular and medial malleolus fractures with marked  displacement. Electronically Signed   By: Monte Fantasia M.D.   On: 03/21/2020 04:21   CT Head Wo Contrast  Result Date: 03/21/2020 CLINICAL DATA:  Fall with  facial trauma EXAM: CT HEAD WITHOUT CONTRAST TECHNIQUE: Contiguous axial images were obtained from the base of the skull through the vertex without intravenous contrast. COMPARISON:  11/05/2017 FINDINGS: Brain: No evidence of acute infarction, hemorrhage, hydrocephalus, extra-axial collection or mass lesion/mass effect. Vascular: No hyperdense vessel or unexpected calcification. Skull: Normal. Negative for fracture or focal lesion. Sinuses/Orbits: No acute finding. Chronic retention cysts in the inferior right maxillary sinus. Other: Fatty atrophy of the right-sided oral tongue, also seen on a 2019 brain MRI when no underlying insult was visualized. IMPRESSION: No evidence of intracranial injury. Electronically Signed   By: Monte Fantasia M.D.   On: 03/21/2020 04:30   DG Foot Complete Right  Result Date: 03/21/2020 CLINICAL DATA:  Fall onto right lower extremity. EXAM: RIGHT FOOT COMPLETE - 3+ VIEW COMPARISON:  None. FINDINGS: Displaced medial malleolus and distal fibular fractures with prominent soft tissue swelling and lateral displacement of the fractures and ankle joint. No acute fracture or malalignment seen at the foot. IMPRESSION: 1. Displaced distal fibular and medial malleolus fractures as described on dedicated ankle series. 2. Negative for foot fracture or subluxation. Electronically Signed   By: Monte Fantasia M.D.   On: 03/21/2020 04:22    Procedures .Splint Application  Date/Time: 03/21/2020 7:52 AM Performed by: Joanne Gavel, PA-C Authorized by: Joanne Gavel, PA-C   Consent:    Consent obtained:  Verbal   Consent given by:  Patient   Risks discussed:  Discoloration, numbness and pain   Alternatives discussed:  No treatment, delayed treatment, alternative treatment and observation Pre-procedure details:     Sensation:  Normal   Skin color:  Pink and warm Procedure details:    Laterality:  Right   Location:  Ankle   Ankle:  R ankle   Splint type:  Ankle stirrup and short leg   Supplies:  Ortho-Glass Post-procedure details:    Pain:  Improved   Sensation:  Normal   Skin color:  Pink and warm   Patient tolerance of procedure:  Tolerated well, no immediate complications Reduction of fracture  Date/Time: 03/21/2020 7:57 AM Performed by: Joanne Gavel, PA-C Authorized by: Joanne Gavel, PA-C  Preparation: Patient was prepped and draped in the usual sterile fashion. Local anesthesia used: no  Anesthesia: Local anesthesia used: no  Sedation: Patient sedated: Please see separate note for sedation.   Patient tolerance: patient tolerated the procedure well with no immediate complications    (including critical care time)  Medications Ordered in ED Medications  propofol (DIPRIVAN) 10 mg/mL bolus/IV push 97 mg (97 mg Intravenous Not Given 03/21/20 0645)  fentaNYL (SUBLIMAZE) injection 100 mcg (100 mcg Intramuscular Given 03/21/20 0429)  fentaNYL (SUBLIMAZE) injection 100 mcg (100 mcg Intramuscular Given 03/21/20 0600)  ondansetron (ZOFRAN) injection 4 mg (4 mg Intravenous Given 03/21/20 0633)  sodium chloride 0.9 % bolus 1,000 mL (1,000 mLs Intravenous New Bag/Given 03/21/20 0932)  phenylephrine (NEO-SYNEPHRINE) injection (70 mg  Given 03/21/20 0635)  propofol (DIPRIVAN) 1000 MG/100ML infusion ( Intravenous Stopped 03/21/20 0640)  morphine 4 MG/ML injection 4 mg (4 mg Intravenous Given 03/21/20 0656)  morphine 4 MG/ML injection 4 mg (4 mg Intravenous Given 03/21/20 0759)    ED Course  I have reviewed the triage vital signs and the nursing notes.  Pertinent labs & imaging results that were available during my care of the patient were reviewed by me and considered in my medical decision making (see chart for details).   -5:58-spoke with Dr. Marcelino Scot, orthopedics.  He recommends  conscious sedation with attempted bedside reduction.  He will also come to evaluate the patient in the ER for further disposition.  No other recommendations at this time.  Consult appreciated.    MDM Rules/Calculators/A&P                          49 year old female with a history of cervical degenerative disc disease, chronic low back pain, HLD, HTN, drug-seeking behavior, and PTSD presenting by EMS after a fall.  She has an obvious deformity to the right ankle, but has intact distal pulses.  She also reports that she hit her head on a dresser, but did not have loss of consciousness.  Vitals are stable.  Images have been reviewed and independently interpreted by me.  There is a bimalleolar fracture with lateral and posterior subluxation of the right ankle.  Specifically, there is a closed, oblique distal fibular and medial malleoli are fracture with marked displacement.  No fractures noted to the foot or superior tibia and fibula.  CT head is unremarkable.  The patient was seen and independently evaluated by Dr. Leonides Schanz, attending physician.  Spoke with Dr. Marcelino Scot, orthopedics, he will come to evaluate the patient in the ER.  I suspect she will likely need admission due to poorly controlled pain.  She has required multiple doses of pain medication in the ER.  Conscious sedation performed by Dr. Leonides Schanz.  Please see her note for separate procedure.  Notably, patient became hypoxic and required bagging during the procedure.  Reduction and splint were attempted by me.  Patient did report minimally improved pain following procedure, but has continued to require multiple doses of pain medication.  COVID-19 test has been ordered and is pending.  Staff is attempted to contact the patient's husband's unsuccessfully.  Patient care transferred to [place] at the end of my shift to follow-up on orthopedic recommendations. Patient presentation, ED course, and plan of care discussed with review of all pertinent labs and  imaging. Please see his/her note for further details regarding further ED course and disposition.   Final Clinical Impression(s) / ED Diagnoses Final diagnoses:  Fall, initial encounter  Closed bimalleolar fracture of right ankle, initial encounter    Rx / DC Orders ED Discharge Orders    None       Laynie Espy A, PA-C 03/21/20 0802    Ward, Delice Bison, DO 03/30/20 2302

## 2020-03-21 NOTE — ED Provider Notes (Addendum)
[name] is a 49 y.o. female, presenting to the ED with injuries from a fall.  Her pain is worst in her right ankle.   HPI from Joline Maxcy, PA-C: "[name] is a 49 y.o. female with a history of cervical degenerative disc disease, chronic low back pain, HLD, HTN, drug-seeking behavior, and PTSD who presents the emergency department by EMS with a chief complaint of fall.  The patient states that she was going to get into bed when her right ankle twisted and gave out on her, causing her to fall.  She states that she hit her forehead on a dresser that "is now dented" before falling to the ground.  She is unsure of how she landed.  She denies LOC, nausea, vomiting.  She is endorsing constant, 10 out of 10, radiating, sharp and achy pain to her right knee, lower leg, right ankle, and right foot as well as pain to her forehead.  She denies chest pain, abdominal pain, shortness of breath, neck pain, back pain, numbness, weakness. EMS applied a splint, but no other treatment was administered prior to arrival.  She does not take blood thinners.  She is established with [facility]."  Past Medical History:  Diagnosis Date  . Anxiety   . Arthritis    "joints ache all over" (10/15/2014)  . Bulging lumbar disc   . Chronic lower back pain   . DDD (degenerative disc disease), cervical   . Depression   . Drug-seeking behavior   . Headache    "weekly" (10/15/2014)  . Hyperlipemia   . Hypertension   . PTSD (post-traumatic stress disorder)   . Skin cancer    "had them cut off my arms; don't know what kind"     Physical Exam  BP (!) 145/87   Pulse 87   Temp 98.1 F (36.7 C) (Oral)   Resp 17   SpO2 96%   Physical Exam Vitals and nursing note reviewed.  Constitutional:      General: She is not in acute distress.    Appearance: She is well-developed. She is not diaphoretic.  HENT:     Head: Normocephalic and atraumatic.     Mouth/Throat:     Mouth: Mucous membranes  are moist.     Pharynx: Oropharynx is clear.  Eyes:     Conjunctiva/sclera: Conjunctivae normal.  Cardiovascular:     Rate and Rhythm: Regular rhythm. Tachycardia present.     Pulses: Normal pulses.          Radial pulses are 2+ on the right side and 2+ on the left side.       Posterior tibial pulses are 2+ on the left side.     Heart sounds: Normal heart sounds.     Comments: Tactile temperature in the extremities appropriate and equal bilaterally. Pulmonary:     Effort: Pulmonary effort is normal. No respiratory distress.     Breath sounds: Normal breath sounds.  Abdominal:     Palpations: Abdomen is soft.     Tenderness: There is no abdominal tenderness. There is no guarding.  Musculoskeletal:     Cervical back: Normal range of motion and neck supple. No tenderness.       Back:       Legs:     Comments: Patient has a short leg splint in place on the right ankle.  Excellent capillary refill, motor function, and sensation to light touch intact in the exposed toes.  Tenderness of  the right lower back, right hip anterior, lateral, posterior without noted deformity, swelling, color abnormality, instability. No tenderness, pain, swelling, or deformity in the knee.  Normal motor function intact in all extremities. No midline spinal tenderness.   Skin:    General: Skin is warm and dry.     Capillary Refill: Capillary refill takes less than 2 seconds.  Neurological:     Mental Status: She is alert and oriented to person, place, and time.     Comments: Sensation light touch grossly intact in the extremities. Motor function intact in each of the extremities. Grip strength equal bilaterally. No noted acute cognitive deficits. No facial droop.  Psychiatric:        Mood and Affect: Mood and affect normal.        Speech: Speech normal.        Behavior: Behavior normal.     ED Course/Procedures     Procedures   DG Lumbar Spine Complete  Result Date: 03/21/2020 CLINICAL DATA:   Fall with right and central tenderness EXAM: LUMBAR SPINE - COMPLETE 4+ VIEW COMPARISON:  None. FINDINGS: No evidence of acute fracture or malalignment. Vertebral body heights are maintained. Mild focal degenerative disc disease at L5-S1. No lytic or blastic osseous lesion. IMPRESSION: Negative. Electronically Signed   By: Jacqulynn Cadet M.D.   On: 03/21/2020 09:41   DG Tibia/Fibula Right  Result Date: 03/21/2020 CLINICAL DATA:  Fall onto right lower extremity EXAM: RIGHT TIBIA AND FIBULA - 2 VIEW COMPARISON:  None. FINDINGS: Acute distal fibular and medial malleolus fractures with marked lateral displacement and disrupted ankle mortise. No mid or proximal leg fracture. IMPRESSION: 1. Displaced distal fibular and medial malleolus fractures with disrupted ankle mortise. 2. No proximal leg fracture. Electronically Signed   By: Monte Fantasia M.D.   On: 03/21/2020 04:23   DG Ankle 2 Views Right  Result Date: 03/21/2020 CLINICAL DATA:  Post reduction EXAM: RIGHT ANKLE - 2 VIEW COMPARISON:  Earlier today FINDINGS: Unchanged lateral and posterior subluxation of foot due to displaced distal fibular and medial malleolus fractures. A splint is now overlapping. IMPRESSION: Distal fibular and medial malleolus fractures with unchanged alignment and subluxation of the foot. Electronically Signed   By: Monte Fantasia M.D.   On: 03/21/2020 07:20   DG Ankle Complete Right  Result Date: 03/21/2020 CLINICAL DATA:  Fall at home EXAM: RIGHT ANKLE - COMPLETE 3+ VIEW COMPARISON:  None. FINDINGS: Oblique fracture through the distal fibula with lateral and posterior displacement by 100%. Avulsion type fracture of the medial malleolus with fragmentation and over 100% lateral displacement. Disrupted ankle mortise with marked lateral and posterior subluxation. IMPRESSION: Distal fibular and medial malleolus fractures with marked displacement. Electronically Signed   By: Monte Fantasia M.D.   On: 03/21/2020 04:21   CT  Head Wo Contrast  Result Date: 03/21/2020 CLINICAL DATA:  Fall with facial trauma EXAM: CT HEAD WITHOUT CONTRAST TECHNIQUE: Contiguous axial images were obtained from the base of the skull through the vertex without intravenous contrast. COMPARISON:  11/05/2017 FINDINGS: Brain: No evidence of acute infarction, hemorrhage, hydrocephalus, extra-axial collection or mass lesion/mass effect. Vascular: No hyperdense vessel or unexpected calcification. Skull: Normal. Negative for fracture or focal lesion. Sinuses/Orbits: No acute finding. Chronic retention cysts in the inferior right maxillary sinus. Other: Fatty atrophy of the right-sided oral tongue, also seen on a 2019 brain MRI when no underlying insult was visualized. IMPRESSION: No evidence of intracranial injury. Electronically Signed   By: Monte Fantasia  M.D.   On: 03/21/2020 04:30   DG Ankle Right Port  Result Date: 03/21/2020 CLINICAL DATA:  Reduction of the right ankle fracture. EXAM: PORTABLE RIGHT ANKLE - 2 VIEW COMPARISON:  03/21/2020 at 0652 hours FINDINGS: Bone detail in the right ankle is limited due to the casting material. Overall alignment of the right ankle is markedly improved compared to the previous examination. Ankle appears to be located. Patient has known fractures involving the distal fibula and the medial malleolus. IMPRESSION: Reduction of the right ankle fracture. Right ankle appears located on these images. Bone detail is limited due to the casting material. Electronically Signed   By: Markus Daft M.D.   On: 03/21/2020 10:29   DG Foot Complete Right  Result Date: 03/21/2020 CLINICAL DATA:  Fall onto right lower extremity. EXAM: RIGHT FOOT COMPLETE - 3+ VIEW COMPARISON:  None. FINDINGS: Displaced medial malleolus and distal fibular fractures with prominent soft tissue swelling and lateral displacement of the fractures and ankle joint. No acute fracture or malalignment seen at the foot. IMPRESSION: 1. Displaced distal fibular and  medial malleolus fractures as described on dedicated ankle series. 2. Negative for foot fracture or subluxation. Electronically Signed   By: Monte Fantasia M.D.   On: 03/21/2020 04:22   DG Hip Unilat W or Wo Pelvis 2-3 Views Right  Result Date: 03/21/2020 CLINICAL DATA:  Right hip pain after a fall today. Initial encounter. EXAM: DG HIP (WITH OR WITHOUT PELVIS) 2-3V RIGHT COMPARISON:  None. FINDINGS: There is no evidence of hip fracture or dislocation. There is no evidence of arthropathy or other focal bone abnormality. IMPRESSION: Negative exam. Electronically Signed   By: Inge Rise M.D.   On: 03/21/2020 09:34    MDM   Clinical Course as of Mar 21 1433  Mon Mar 21, 2020  0828 Patient now states her pain in her ankle has improved to 5/10.   [SJ]  6789 Spoke with Silvestre Gunner, PA on call with orthopedics. States he would like to try the reduction again.    [SJ]  [place] reviewed post-reduction xray and states patient can be discharged.  She can call the office to make an appointment likely in the next week or 2.   [SJ]  3810 Patient states she feels as though her pain is well controlled, she just feels anxious.  She states she frequently feels anxious.  She continues to deny any chest complaints, including shortness of breath, chest pain, palpitations, etc.   [SJ]    Clinical Course User Index [SJ] Lorayne Bender, PA-C   Patient care handoff report received from [facility], PA-C. Plan: Ortho to see patient here in the ED and give further instructions.  Patient presents primarily complaining of right ankle pain following a fall.  Bimalleolar fracture with dislocation noted on x-ray. Patient required second reduction.  Follow-up x-ray after this reduction appears to have been successful.  Circulation, motor, and sensation light touch continues to be intact following second reduction and splinting. Patient mildly tachycardic, however, states she feels quite anxious  due to simply being in the hospital and denies any chest complaints.  Patient and her husband were given instructions for home care as well as return precautions.  Both parties voice understanding of these instructions, accept the plan, and are comfortable with discharge.  Patient and her husband state they have a walker and a wheelchair at home.  They asked about a knee scooter. Coordinated with Rosendo Gros, care management, for knee scooter.  None in stock here.  I wrote a prescription and Rosendo Gros provided instructions for the patient on how to pick up this device.  Findings and plan of care discussed with Deno Etienne, DO. Dr. Tyrone Nine personally evaluated and examined this patient.  Vitals:   03/21/20 0638 03/21/20 0643 03/21/20 0648 03/21/20 0653  BP: (!) 138/99 (!) 155/88 (!) 138/96 (!) 145/87  Pulse: 92 89 92 87  Resp: 16 (!) 24 19 17   Temp:      TempSrc:      SpO2: 100% 100% 97% 96%      Lorayne Bender, PA-C 03/21/20 1434    Lorayne Bender, PA-C 03/21/20 1435    Lorayne Bender, PA-C 03/21/20 [place], [place], DO 03/21/20 1447

## 2020-03-21 NOTE — Discharge Instructions (Addendum)
Fracture care There is evidence of a fracture on the x-ray. Pain:  Antiinflammatory medications: Take 600 mg of ibuprofen every 6 hours or 440 mg (over the counter dose) to 500 mg (prescription dose) of naproxen every 12 hours for the next 3 days. After this time, these medications may be used as needed for pain. Take these medications with food to avoid upset stomach. Choose only one of these medications, do not take them together. Acetaminophen (generic for Tylenol): Should you continue to have additional pain while taking the ibuprofen or naproxen, you may add in acetaminophen as needed. Your daily total maximum amount of acetaminophen from all sources should be limited to 4000mg /day for persons without liver problems, or 2000mg /day for those with liver problems. Percocet: May take Percocet (oxycodone-acetaminophen) as needed for severe pain.   Do not drive or perform other dangerous activities while taking this medication as it can cause drowsiness as well as changes in reaction time and judgement.  Please note that each pill of Percocet contains 325 mg of acetaminophen (generic for Tylenol) and the above dosage limits apply. Methocarbamol: Methocarbamol (generic for Robaxin) is a muscle relaxer and can help relieve stiff muscles or muscle spasms.  Do not drive or perform other dangerous activities while taking this medication as it can cause drowsiness as well as changes in reaction time and judgement. Do not take this medication with any other muscle relaxers. Ice: May apply ice to the injured area for no more than 15 minutes at a time to reduce swelling and pain. Elevation: Keep the extremity elevated whenever possible to reduce swelling and pain. Splint: Keep the splint clean and dry.  Protect it from water during bathing.  If the splint gets wet, you will need to have it reapplied.  Do not leave a wet splint against the skin as this can cause skin breakdown.  Call the orthopedist office or come  to the ED for splint replacement, if needed.  Follow-up: Follow-up with the orthopedic specialist for any further management of this issue.  Call the number provided to set up an appointment. Return: Return to the emergency department for severely increased pain, numbness, blanching of the skin, or any other major concerns.

## 2020-03-21 NOTE — ED Provider Notes (Signed)
.  Sedation  Date/Time: 03/21/2020 9:44 AM Performed by: Deno Etienne, DO Authorized by: Deno Etienne, DO   Consent:    Consent obtained:  Written   Consent given by:  Patient   Risks discussed:  Allergic reaction, dysrhythmia, inadequate sedation, nausea, vomiting, respiratory compromise necessitating ventilatory assistance and intubation, prolonged sedation necessitating reversal and prolonged hypoxia resulting in organ damage   Alternatives discussed:  Analgesia without sedation, anxiolysis and regional anesthesia Universal protocol:    Immediately prior to procedure a time out was called: yes     Patient identity confirmation method:  Verbally with patient Indications:    Procedure performed:  Fracture reduction   Procedure necessitating sedation performed by:  Different physician Pre-sedation assessment:    Time since last food or drink:  6   ASA classification: class 2 - patient with mild systemic disease     Neck mobility: normal     Mouth opening:  2 finger widths   Thyromental distance:  2 finger widths   Mallampati score:  II - soft palate, uvula, fauces visible   Pre-sedation assessments completed and reviewed: airway patency, cardiovascular function, hydration status, mental status, nausea/vomiting, pain level, respiratory function and temperature   Immediate pre-procedure details:    Reassessment: Patient reassessed immediately prior to procedure     Reviewed: vital signs, relevant labs/tests and NPO status     Verified: bag valve mask available, emergency equipment available, intubation equipment available, IV patency confirmed, oxygen available and suction available   Procedure details (see MAR for exact dosages):    Preoxygenation:  Room air   Sedation:  Ketamine   Intended level of sedation: moderate (conscious sedation)   Analgesia:  Morphine and fentanyl   Intra-procedure monitoring:  Blood pressure monitoring, cardiac monitor, continuous capnometry, continuous pulse  oximetry, frequent LOC assessments and frequent vital sign checks   Intra-procedure events: none     Total Provider sedation time (minutes):  35 Post-procedure details:    Attendance: Constant attendance by certified staff until patient recovered     Recovery: Patient returned to pre-procedure baseline     Post-sedation assessments completed and reviewed: airway patency, cardiovascular function, hydration status, mental status, nausea/vomiting, pain level, respiratory function and temperature     Patient is stable for discharge or admission: yes     Patient tolerance:  Tolerated well, no immediate complications   Patient with a laterally displaced right ankle fracture. Seen by orthopedics after attempted reduction by ED staff overnight. Requesting a repeat sedation for reduction attempt. After discussion with the overnight team it sounded like the patient had become hypoxic with propofol, was given ketamine.    Deno Etienne, DO 03/21/20 1224

## 2020-03-21 NOTE — ED Notes (Signed)
Pt reports 9/10 pain at this time, providers notified

## 2020-03-21 NOTE — Progress Notes (Signed)
Orthopedic Tech Progress Note Patient Details:  [name] [birth year] [id number] Patient isn't not able to use crutches. Asked RN to get in touch with THERAPY to help patient with a WALKER or a KNEE SCOOTER  Patient ID: [name], female   DOB: May 31, 1970, 49 y.o.   MRN: [id number]   [name] 03/21/2020, 2:04 PM

## 2020-03-21 NOTE — Procedures (Addendum)
Procedure: Right ankle closed reduction   Indication: Right ankle fx, trimalleolar fracture dislocation   Surgeon: Silvestre Gunner, PA-C   Assist: None   Anesthesia: Ketamine via EDP   EBL: None   Complications: None   Findings: After risks/benefits explained patient desires to undergo procedure. Consent obtained and time out performed. Sedation given and verified. The right ankle was reduced and splinted. Pt tolerated the procedure well. X-rays pending.       Lisette Abu, PA-C Orthopedic Surgery [phone]  The service was rendered under my overall direction and control, and I was immediately available via phone/ pager or present on site. We will follow up post reduction x-rays to see how to proceed.  Altamese Dublin, MD [facility], [facility] [phone]

## 2020-03-21 NOTE — ED Notes (Signed)
Sedation END  

## 2020-03-21 NOTE — ED Notes (Signed)
Pt attempted to walk with crutches.  Is unable to perform successfully.  Spoke with ortho tech and they will assess.

## 2020-03-21 NOTE — Progress Notes (Signed)
Orthopedic Tech Progress Note Patient Details:  [name] [birth year] [id number]  Ortho Devices Type of Ortho Device: Post (short leg) splint, Stirrup splint Splint Material: Plaster Ortho Device/Splint Location: Right Lower Extremity Ortho Device/Splint Interventions: Ordered, Application   Post Interventions Patient Tolerated: Well Instructions Provided: Care of device Assisted in application of splint with Silvestre Gunner, PA-C  Tammy Sours 03/21/2020, 9:51 AM

## 2020-03-21 NOTE — ED Notes (Signed)
Placed a external cath patient is resting with call bell in reach and family at bedside 

## 2020-03-21 NOTE — Progress Notes (Signed)
   03/21/20 1203  TOC ED Mini Assessment  TOC Time spent with patient (minutes): 20  PING Used in TOC Assessment No  Admission or Readmission Diverted Yes  Interventions which prevented an admission or readmission DME Provided  What brought you to the Emergency Department?  I fell out of bed  Barriers to Discharge ED DME delivery  Barrier interventions provided address and phone number of location to pickup DME  Means of departure Car    Fuller Mandril, RN, BSN, [place] [phone] Pt qualifies for DME knee scooter.  DME  ordered through [facility].  Freda Munro of [facility] advised that pt will have to pickup in [employer] [facility] located on [address] in [place], [place] as we do not keep a supply of knee scooters in the hospital.  Information relayed to EDP and placed on After Visit Summary (AVS).

## 2020-03-21 NOTE — Consult Note (Signed)
Reason for Consult:Right ankle fx Referring Physician: K Ward Time called: [id number] Time at bedside: 0840   [name] is an 49 y.o. female.  HPI: [name] was going to get in bed. She's not sure exactly what happened but seemed like her right foot got caught under something and she fell. She had immediate ankle pain and could not bear weight. She came to the ED where x-rays showed an ankle fx and orthopedic surgery was consulted. She underwent CR by EDP that was unsuccessful. She lives at home with her husband and 2 kids and does not work.  Past Medical History:  Diagnosis Date  . Anxiety   . Arthritis    "joints ache all over" (10/15/2014)  . Bulging lumbar disc   . Chronic lower back pain   . DDD (degenerative disc disease), cervical   . Depression   . Drug-seeking behavior   . Headache    "weekly" (10/15/2014)  . Hyperlipemia   . Hypertension   . PTSD (post-traumatic stress disorder)   . Skin cancer    "had them cut off my arms; don't know what kind"    Past Surgical History:  Procedure Laterality Date  . ABLATION ON ENDOMETRIOSIS  2008  . BIOPSY  12/27/2018   Procedure: BIOPSY;  Surgeon: Thornton Park, MD;  Location: [facility];  Service: Gastroenterology;;  . ESOPHAGOGASTRODUODENOSCOPY (EGD) WITH PROPOFOL N/A 12/27/2018   Procedure: ESOPHAGOGASTRODUODENOSCOPY (EGD) WITH PROPOFOL;  Surgeon: Thornton Park, MD;  Location: [facility];  Service: Gastroenterology;  Laterality: N/A;  . HEMORRHOID SURGERY  ~ 2002    Family History  Problem Relation Age of Onset  . Breast cancer Mother   . Diabetes Mother   . Breast cancer Maternal Grandmother   . Breast cancer Paternal Grandmother   . Colon polyps Paternal Grandmother   . Colon cancer Neg Hx   . Esophageal cancer Neg Hx   . Rectal cancer Neg Hx   . Stomach cancer Neg Hx     Social History:  reports that she has never smoked. She has never used smokeless tobacco. She reports previous alcohol use. She reports that  she does not use drugs.  Allergies:  Allergies  Allergen Reactions  . Lactose Intolerance (Gi) Nausea Only    Medications: I have reviewed the patient's current medications.  Results for orders placed or performed during the hospital encounter of 03/21/20 (from the past 48 hour(s))  Resp Panel by RT-PCR (Flu A&B, Covid) Nasopharyngeal Swab     Status: None   Collection Time: 03/21/20  7:00 AM   Specimen: Nasopharyngeal Swab; Nasopharyngeal(NP) swabs in vial transport medium  Result Value Ref Range   SARS Coronavirus 2 by RT PCR NEGATIVE NEGATIVE    Comment: (NOTE) SARS-CoV-2 target nucleic acids are NOT DETECTED.  The SARS-CoV-2 RNA is generally detectable in upper respiratory specimens during the acute phase of infection. The lowest concentration of SARS-CoV-2 viral copies this assay can detect is 138 copies/mL. A negative result does not preclude SARS-Cov-2 infection and should not be used as the sole basis for treatment or other patient management decisions. A negative result may occur with  improper specimen collection/handling, submission of specimen other than nasopharyngeal swab, presence of viral mutation(s) within the areas targeted by this assay, and inadequate number of viral copies(<138 copies/mL). A negative result must be combined with clinical observations, patient history, and epidemiological information. The expected result is Negative.  Fact Sheet for Patients:  EntrepreneurPulse.com.au  Fact Sheet for Healthcare Providers:  IncredibleEmployment.be  This test is no t yet approved or cleared by the [place] and  has been authorized for detection and/or diagnosis of SARS-CoV-2 by FDA under an Emergency Use Authorization (EUA). This EUA will remain  in effect (meaning this test can be used) for the duration of the COVID-19 declaration under Section 564(b)(1) of the Act, 21 U.S.C.section 360bbb-3(b)(1), unless the  authorization is terminated  or revoked sooner.       Influenza A by PCR NEGATIVE NEGATIVE   Influenza B by PCR NEGATIVE NEGATIVE    Comment: (NOTE) The Xpert Xpress SARS-CoV-2/FLU/RSV plus assay is intended as an aid in the diagnosis of influenza from Nasopharyngeal swab specimens and should not be used as a sole basis for treatment. Nasal washings and aspirates are unacceptable for Xpert Xpress SARS-CoV-2/FLU/RSV testing.  Fact Sheet for Patients: EntrepreneurPulse.com.au  Fact Sheet for Healthcare Providers: IncredibleEmployment.be  This test is not yet approved or cleared by the [place] FDA and has been authorized for detection and/or diagnosis of SARS-CoV-2 by FDA under an Emergency Use Authorization (EUA). This EUA will remain in effect (meaning this test can be used) for the duration of the COVID-19 declaration under Section 564(b)(1) of the Act, 21 U.S.C. section 360bbb-3(b)(1), unless the authorization is terminated or revoked.  Performed at [facility], [place] [address] [place], [place] [address]     DG Lumbar Spine Complete  Result Date: 03/21/2020 CLINICAL DATA:  Fall with right and central tenderness EXAM: LUMBAR SPINE - COMPLETE 4+ VIEW COMPARISON:  None. FINDINGS: No evidence of acute fracture or malalignment. Vertebral body heights are maintained. Mild focal degenerative disc disease at L5-S1. No lytic or blastic osseous lesion. IMPRESSION: Negative. Electronically Signed   By: Jacqulynn Cadet M.D.   On: 03/21/2020 09:41   DG Tibia/Fibula Right  Result Date: 03/21/2020 CLINICAL DATA:  Fall onto right lower extremity EXAM: RIGHT TIBIA AND FIBULA - 2 VIEW COMPARISON:  None. FINDINGS: Acute distal fibular and medial malleolus fractures with marked lateral displacement and disrupted ankle mortise. No mid or proximal leg fracture. IMPRESSION: 1. Displaced distal fibular and medial malleolus fractures with disrupted  ankle mortise. 2. No proximal leg fracture. Electronically Signed   By: Monte Fantasia M.D.   On: 03/21/2020 04:23   DG Ankle 2 Views Right  Result Date: 03/21/2020 CLINICAL DATA:  Post reduction EXAM: RIGHT ANKLE - 2 VIEW COMPARISON:  Earlier today FINDINGS: Unchanged lateral and posterior subluxation of foot due to displaced distal fibular and medial malleolus fractures. A splint is now overlapping. IMPRESSION: Distal fibular and medial malleolus fractures with unchanged alignment and subluxation of the foot. Electronically Signed   By: Monte Fantasia M.D.   On: 03/21/2020 07:20   DG Ankle Complete Right  Result Date: 03/21/2020 CLINICAL DATA:  Fall at home EXAM: RIGHT ANKLE - COMPLETE 3+ VIEW COMPARISON:  None. FINDINGS: Oblique fracture through the distal fibula with lateral and posterior displacement by 100%. Avulsion type fracture of the medial malleolus with fragmentation and over 100% lateral displacement. Disrupted ankle mortise with marked lateral and posterior subluxation. IMPRESSION: Distal fibular and medial malleolus fractures with marked displacement. Electronically Signed   By: Monte Fantasia M.D.   On: 03/21/2020 04:21   CT Head Wo Contrast  Result Date: 03/21/2020 CLINICAL DATA:  Fall with facial trauma EXAM: CT HEAD WITHOUT CONTRAST TECHNIQUE: Contiguous axial images were obtained from the base of the skull through the vertex without intravenous contrast. COMPARISON:  11/05/2017 FINDINGS:  Brain: No evidence of acute infarction, hemorrhage, hydrocephalus, extra-axial collection or mass lesion/mass effect. Vascular: No hyperdense vessel or unexpected calcification. Skull: Normal. Negative for fracture or focal lesion. Sinuses/Orbits: No acute finding. Chronic retention cysts in the inferior right maxillary sinus. Other: Fatty atrophy of the right-sided oral tongue, also seen on a 2019 brain MRI when no underlying insult was visualized. IMPRESSION: No evidence of intracranial  injury. Electronically Signed   By: Monte Fantasia M.D.   On: 03/21/2020 04:30   DG Foot Complete Right  Result Date: 03/21/2020 CLINICAL DATA:  Fall onto right lower extremity. EXAM: RIGHT FOOT COMPLETE - 3+ VIEW COMPARISON:  None. FINDINGS: Displaced medial malleolus and distal fibular fractures with prominent soft tissue swelling and lateral displacement of the fractures and ankle joint. No acute fracture or malalignment seen at the foot. IMPRESSION: 1. Displaced distal fibular and medial malleolus fractures as described on dedicated ankle series. 2. Negative for foot fracture or subluxation. Electronically Signed   By: Monte Fantasia M.D.   On: 03/21/2020 04:22   DG Hip Unilat W or Wo Pelvis 2-3 Views Right  Result Date: 03/21/2020 CLINICAL DATA:  Right hip pain after a fall today. Initial encounter. EXAM: DG HIP (WITH OR WITHOUT PELVIS) 2-3V RIGHT COMPARISON:  None. FINDINGS: There is no evidence of hip fracture or dislocation. There is no evidence of arthropathy or other focal bone abnormality. IMPRESSION: Negative exam. Electronically Signed   By: Inge Rise M.D.   On: 03/21/2020 09:34    Review of Systems  HENT: Negative for ear discharge, ear pain, hearing loss and tinnitus.   Eyes: Negative for photophobia and pain.  Respiratory: Negative for cough and shortness of breath.   Cardiovascular: Negative for chest pain.  Gastrointestinal: Negative for abdominal pain, nausea and vomiting.  Genitourinary: Negative for dysuria, flank pain, frequency and urgency.  Musculoskeletal: Positive for arthralgias (Right ankle). Negative for back pain, myalgias and neck pain.  Neurological: Negative for dizziness and headaches.  Hematological: Does not bruise/bleed easily.  Psychiatric/Behavioral: The patient is not nervous/anxious.    Blood pressure (!) 142/74, pulse (!) 110, temperature 98.1 F (36.7 C), temperature source Oral, resp. rate 20, weight 96 kg, SpO2 97 %. Physical  Exam Constitutional:      General: She is not in acute distress.    Appearance: She is well-developed. She is not diaphoretic.  HENT:     Head: Normocephalic and atraumatic.  Eyes:     General: No scleral icterus.       Right eye: No discharge.        Left eye: No discharge.     Conjunctiva/sclera: Conjunctivae normal.  Cardiovascular:     Rate and Rhythm: Normal rate and regular rhythm.  Pulmonary:     Effort: Pulmonary effort is normal. No respiratory distress.  Musculoskeletal:     Cervical back: Normal range of motion.     Comments: LLE No traumatic wounds, ecchymosis, or rash  Ankle mod edema  No knee effusion  Knee stable to varus/ valgus and anterior/posterior stress  Sens DPN, SPN, TN intact  Motor EHL 5/5  DP 2+, No significant pedal edema  Skin:    General: Skin is warm and dry.  Neurological:     Mental Status: She is alert.  Psychiatric:        Behavior: Behavior normal.     Assessment/Plan: Right ankle fx -- Will plan repeat CR. If successful will d/c home NWB with plans to see Dr. Marcelino Scot  in the office to discuss surgical fixation. Depression    Lisette Abu, PA-C Orthopedic Surgery [phone] 03/21/2020, 9:55 AM

## 2020-03-21 NOTE — ED Provider Notes (Signed)
Medical screening examination/treatment/procedure(s) were conducted as a shared visit with non-physician practitioner(s) and myself.  I personally evaluated the patient during the encounter.     .Sedation  Date/Time: 03/21/2020 6:00 AM Performed by: Darol Cush, Delice Bison, DO Authorized by: Mubashir Mallek, Delice Bison, DO   Consent:    Consent obtained:  Verbal and written   Consent given by:  Patient   Risks discussed:  Allergic reaction, dysrhythmia, inadequate sedation, nausea, prolonged hypoxia resulting in organ damage, prolonged sedation necessitating reversal, respiratory compromise necessitating ventilatory assistance and intubation and vomiting   Alternatives discussed:  Analgesia without sedation, anxiolysis and regional anesthesia Universal protocol:    Procedure explained and questions answered to patient or proxy's satisfaction: yes     Relevant documents present and verified: yes     Test results available and properly labeled: yes     Imaging studies available: yes     Required blood products, implants, devices, and special equipment available: yes     Site/side marked: yes     Immediately prior to procedure a time out was called: yes     Patient identity confirmation method:  Verbally with patient Indications:    Procedure necessitating sedation performed by:  Physician performing sedation Pre-sedation assessment:    Time since last food or drink:  8pm   ASA classification: class 2 - patient with mild systemic disease     Neck mobility: normal     Mouth opening:  3 or more finger widths   Thyromental distance:  4 finger widths   Mallampati score:  I - soft palate, uvula, fauces, pillars visible   Pre-sedation assessments completed and reviewed: airway patency, cardiovascular function, hydration status, mental status, nausea/vomiting, pain level, respiratory function and temperature     Pre-sedation assessment completed:  03/21/2020 6:00 AM Immediate pre-procedure details:     Reassessment: Patient reassessed immediately prior to procedure     Reviewed: vital signs, relevant labs/tests and NPO status     Verified: bag valve mask available, emergency equipment available, intubation equipment available, IV patency confirmed, oxygen available and suction available   Procedure details (see MAR for exact dosages):    Preoxygenation:  Room air   Sedation:  Propofol   Intended level of sedation: deep   Analgesia:  Fentanyl   Intra-procedure monitoring:  Blood pressure monitoring, cardiac monitor, continuous pulse oximetry, frequent LOC assessments, frequent vital sign checks and continuous capnometry   Intra-procedure events: none     Intra-procedure management:  Airway repositioning and BVM ventilation   Total Provider sedation time (minutes):  15 Post-procedure details:    Post-sedation assessment completed:  03/21/2020 6:56 AM   Attendance: Constant attendance by certified staff until patient recovered     Recovery: Patient returned to pre-procedure baseline     Post-sedation assessments completed and reviewed: airway patency, cardiovascular function, hydration status, mental status, nausea/vomiting, pain level, respiratory function and temperature     Patient is stable for discharge or admission: yes     Patient tolerance:  Tolerated well, no immediate complications    Patient here with bimalleolar fracture with significant displacement of the distal portion of the lateral malleolus.  Reduction performed at bedside without significant improvement in alignment.  During sedation, patient sats dropped into the 70s requiring airway repositioning and BVM.  Patient will need to go to the operating room.  She continues to be neurovascularly intact distally.  Dr. Marcelino Scot to see in the ED.    Jaiyanna Safran, Delice Bison, DO 03/21/20 [phone]

## 2020-03-21 NOTE — ED Triage Notes (Signed)
BIB [facility] from home. Pt was walking to the bathroom when her ankle gave out and she fell. Pt states she didn't hit head. No LOC. Not on any thinners. Swelling and some deformity noted to right ankle. Decrease sensation and movement to right ankle. Pedal pulses strong.  134/84 101 HR 18 98%

## 2020-03-25 ENCOUNTER — Encounter (HOSPITAL_BASED_OUTPATIENT_CLINIC_OR_DEPARTMENT_OTHER)
Admission: RE | Admit: 2020-03-25 | Discharge: 2020-03-25 | Disposition: A | Discharge status: Home / Self Care | Payer: Medicare Other | Source: Ambulatory Visit | Attending: Orthopedic Surgery | Admitting: Orthopedic Surgery

## 2020-03-25 ENCOUNTER — Other Ambulatory Visit (HOSPITAL_COMMUNITY)
Admission: RE | Admit: 2020-03-25 | Discharge: 2020-03-25 | Disposition: A | Discharge status: Home / Self Care | Payer: Medicare Other | Source: Ambulatory Visit | Attending: Orthopedic Surgery | Admitting: Orthopedic Surgery

## 2020-03-25 ENCOUNTER — Encounter (HOSPITAL_BASED_OUTPATIENT_CLINIC_OR_DEPARTMENT_OTHER): Payer: Self-pay | Admitting: Orthopedic Surgery

## 2020-03-25 ENCOUNTER — Other Ambulatory Visit: Payer: Self-pay

## 2020-03-25 DIAGNOSIS — Z01812 Encounter for preprocedural laboratory examination: Secondary | ICD-10-CM | POA: Diagnosis not present

## 2020-03-25 DIAGNOSIS — Z79899 Other long term (current) drug therapy: Secondary | ICD-10-CM | POA: Diagnosis not present

## 2020-03-25 DIAGNOSIS — X58XXXA Exposure to other specified factors, initial encounter: Secondary | ICD-10-CM | POA: Diagnosis not present

## 2020-03-25 DIAGNOSIS — S82851A Displaced trimalleolar fracture of right lower leg, initial encounter for closed fracture: Secondary | ICD-10-CM | POA: Diagnosis not present

## 2020-03-25 DIAGNOSIS — Z20822 Contact with and (suspected) exposure to covid-19: Secondary | ICD-10-CM | POA: Insufficient documentation

## 2020-03-25 DIAGNOSIS — S93431A Sprain of tibiofibular ligament of right ankle, initial encounter: Secondary | ICD-10-CM | POA: Diagnosis not present

## 2020-03-25 DIAGNOSIS — S82841A Displaced bimalleolar fracture of right lower leg, initial encounter for closed fracture: Secondary | ICD-10-CM | POA: Diagnosis not present

## 2020-03-25 LAB — BASIC METABOLIC PANEL
Anion gap: 13 (ref 5–15)
BUN: 8 mg/dL (ref 6–20)
CO2: 24 mmol/L (ref 22–32)
Calcium: 9.1 mg/dL (ref 8.9–10.3)
Chloride: 94 mmol/L — ABNORMAL LOW (ref 98–111)
Creatinine, Ser: 0.82 mg/dL (ref 0.44–1.00)
GFR, Estimated: >60 mL/min (ref >60)
Glucose, Bld: 107 mg/dL — ABNORMAL HIGH (ref 70–99)
Potassium: 4.3 mmol/L (ref 3.5–5.1)
Sodium: 131 mmol/L — ABNORMAL LOW (ref 135–145)

## 2020-03-25 LAB — SARS CORONAVIRUS 2 (TAT 6-24 HRS): SARS Coronavirus 2: NEGATIVE

## 2020-03-25 NOTE — Progress Notes (Signed)

## 2020-03-28 ENCOUNTER — Encounter (HOSPITAL_BASED_OUTPATIENT_CLINIC_OR_DEPARTMENT_OTHER): Payer: Self-pay | Admitting: Orthopedic Surgery

## 2020-03-28 ENCOUNTER — Encounter (HOSPITAL_BASED_OUTPATIENT_CLINIC_OR_DEPARTMENT_OTHER)
Admission: RE | Disposition: A | Discharge status: Home / Self Care | Payer: Self-pay | Source: Home / Self Care | Attending: Orthopedic Surgery

## 2020-03-28 ENCOUNTER — Ambulatory Visit (HOSPITAL_BASED_OUTPATIENT_CLINIC_OR_DEPARTMENT_OTHER): Payer: Medicare Other | Admitting: Anesthesiology

## 2020-03-28 ENCOUNTER — Other Ambulatory Visit: Payer: Self-pay

## 2020-03-28 ENCOUNTER — Ambulatory Visit (HOSPITAL_BASED_OUTPATIENT_CLINIC_OR_DEPARTMENT_OTHER)
Admission: RE | Admit: 2020-03-28 | Discharge: 2020-03-28 | Disposition: A | Discharge status: Home / Self Care | Payer: Medicare Other | Attending: Orthopedic Surgery | Admitting: Orthopedic Surgery

## 2020-03-28 DIAGNOSIS — S93431A Sprain of tibiofibular ligament of right ankle, initial encounter: Secondary | ICD-10-CM | POA: Insufficient documentation

## 2020-03-28 DIAGNOSIS — Z79899 Other long term (current) drug therapy: Secondary | ICD-10-CM | POA: Diagnosis not present

## 2020-03-28 DIAGNOSIS — I10 Essential (primary) hypertension: Secondary | ICD-10-CM | POA: Diagnosis not present

## 2020-03-28 DIAGNOSIS — S82841A Displaced bimalleolar fracture of right lower leg, initial encounter for closed fracture: Secondary | ICD-10-CM | POA: Diagnosis present

## 2020-03-28 DIAGNOSIS — S82851A Displaced trimalleolar fracture of right lower leg, initial encounter for closed fracture: Secondary | ICD-10-CM | POA: Insufficient documentation

## 2020-03-28 DIAGNOSIS — G8918 Other acute postprocedural pain: Secondary | ICD-10-CM | POA: Diagnosis not present

## 2020-03-28 DIAGNOSIS — X58XXXA Exposure to other specified factors, initial encounter: Secondary | ICD-10-CM | POA: Insufficient documentation

## 2020-03-28 DIAGNOSIS — E876 Hypokalemia: Secondary | ICD-10-CM | POA: Diagnosis not present

## 2020-03-28 HISTORY — PX: ORIF ANKLE FRACTURE: SHX5408

## 2020-03-28 SURGERY — OPEN REDUCTION INTERNAL FIXATION (ORIF) ANKLE FRACTURE
Anesthesia: General | Site: Ankle | Laterality: Right

## 2020-03-28 MED ORDER — OXYCODONE HCL 5 MG PO TABS
ORAL_TABLET | ORAL | Status: AC
  Filled 2020-03-28: qty 1

## 2020-03-28 MED ORDER — CEFAZOLIN SODIUM-DEXTROSE 2-4 GM/100ML-% IV SOLN
2.0000 g | INTRAVENOUS | Status: AC
  Administered 2020-03-28: 2 g via INTRAVENOUS

## 2020-03-28 MED ORDER — FENTANYL CITRATE (PF) 100 MCG/2ML IJ SOLN
INTRAMUSCULAR | Status: DC | PRN
  Administered 2020-03-28 (×3): 100 ug via INTRAVENOUS

## 2020-03-28 MED ORDER — PROPOFOL 500 MG/50ML IV EMUL
INTRAVENOUS | Status: AC
  Filled 2020-03-28: qty 50

## 2020-03-28 MED ORDER — ACETAMINOPHEN 500 MG PO TABS
ORAL_TABLET | ORAL | Status: AC
  Filled 2020-03-28: qty 2

## 2020-03-28 MED ORDER — MIDAZOLAM HCL 2 MG/2ML IJ SOLN
INTRAMUSCULAR | Status: AC
  Filled 2020-03-28: qty 2

## 2020-03-28 MED ORDER — ROCURONIUM BROMIDE 100 MG/10ML IV SOLN
INTRAVENOUS | Status: DC | PRN
  Administered 2020-03-28: 60 mg via INTRAVENOUS

## 2020-03-28 MED ORDER — ONDANSETRON HCL 4 MG PO TABS: 4.0000 mg | ORAL_TABLET | Freq: Three times a day (TID) | ORAL | 0 refills | Status: DC | PRN

## 2020-03-28 MED ORDER — FENTANYL CITRATE (PF) 100 MCG/2ML IJ SOLN
25.0000 ug | INTRAMUSCULAR | Status: DC | PRN
  Administered 2020-03-28: 50 ug via INTRAVENOUS

## 2020-03-28 MED ORDER — LIDOCAINE HCL (CARDIAC) PF 100 MG/5ML IV SOSY
PREFILLED_SYRINGE | INTRAVENOUS | Status: DC | PRN
  Administered 2020-03-28: 100 mg via INTRAVENOUS

## 2020-03-28 MED ORDER — DEXAMETHASONE SODIUM PHOSPHATE 10 MG/ML IJ SOLN
INTRAMUSCULAR | Status: AC
  Filled 2020-03-28: qty 1

## 2020-03-28 MED ORDER — SCOPOLAMINE 1 MG/3DAYS TD PT72
MEDICATED_PATCH | TRANSDERMAL | Status: AC
  Filled 2020-03-28: qty 1

## 2020-03-28 MED ORDER — ROCURONIUM BROMIDE 10 MG/ML (PF) SYRINGE
PREFILLED_SYRINGE | INTRAVENOUS | Status: AC
  Filled 2020-03-28: qty 10

## 2020-03-28 MED ORDER — SUGAMMADEX SODIUM 200 MG/2ML IV SOLN
INTRAVENOUS | Status: DC | PRN
  Administered 2020-03-28: 200 mg via INTRAVENOUS

## 2020-03-28 MED ORDER — ONDANSETRON HCL 4 MG/2ML IJ SOLN
INTRAMUSCULAR | Status: AC
  Filled 2020-03-28: qty 2

## 2020-03-28 MED ORDER — OXYCODONE HCL 5 MG PO TABS
5.0000 mg | ORAL_TABLET | ORAL | 0 refills | Status: DC | PRN
Start: 2020-03-28 — End: 2020-05-12

## 2020-03-28 MED ORDER — PROPOFOL 10 MG/ML IV BOLUS
INTRAVENOUS | Status: AC
  Filled 2020-03-28: qty 20

## 2020-03-28 MED ORDER — DEXAMETHASONE SODIUM PHOSPHATE 10 MG/ML IJ SOLN
INTRAMUSCULAR | Status: DC | PRN
  Administered 2020-03-28: 10 mg via INTRAVENOUS

## 2020-03-28 MED ORDER — ONDANSETRON HCL 4 MG/2ML IJ SOLN
INTRAMUSCULAR | Status: DC | PRN
  Administered 2020-03-28: 4 mg via INTRAVENOUS

## 2020-03-28 MED ORDER — OXYCODONE HCL 5 MG/5ML PO SOLN: 5.0000 mg | Freq: Once | ORAL | Status: AC | PRN

## 2020-03-28 MED ORDER — FENTANYL CITRATE (PF) 100 MCG/2ML IJ SOLN
INTRAMUSCULAR | Status: AC
  Filled 2020-03-28: qty 2

## 2020-03-28 MED ORDER — MIDAZOLAM HCL 5 MG/5ML IJ SOLN
INTRAMUSCULAR | Status: DC | PRN
  Administered 2020-03-28: 2 mg via INTRAVENOUS

## 2020-03-28 MED ORDER — CEFAZOLIN SODIUM-DEXTROSE 2-4 GM/100ML-% IV SOLN
INTRAVENOUS | Status: AC
  Filled 2020-03-28: qty 100

## 2020-03-28 MED ORDER — ACETAMINOPHEN 500 MG PO TABS
1000.0000 mg | ORAL_TABLET | Freq: Once | ORAL | Status: AC
  Administered 2020-03-28: 1000 mg via ORAL

## 2020-03-28 MED ORDER — PROPOFOL 10 MG/ML IV BOLUS
INTRAVENOUS | Status: DC | PRN
  Administered 2020-03-28: 200 mg via INTRAVENOUS

## 2020-03-28 MED ORDER — LIDOCAINE 2% (20 MG/ML) 5 ML SYRINGE
INTRAMUSCULAR | Status: AC
  Filled 2020-03-28: qty 5

## 2020-03-28 MED ORDER — PROMETHAZINE HCL 25 MG/ML IJ SOLN: 6.2500 mg | INTRAMUSCULAR | Status: DC | PRN

## 2020-03-28 MED ORDER — LACTATED RINGERS IV SOLN: INTRAVENOUS | Status: DC

## 2020-03-28 MED ORDER — FENTANYL CITRATE (PF) 100 MCG/2ML IJ SOLN
50.0000 ug | Freq: Once | INTRAMUSCULAR | Status: AC
  Administered 2020-03-28: 50 ug via INTRAVENOUS

## 2020-03-28 MED ORDER — 0.9 % SODIUM CHLORIDE (POUR BTL) OPTIME
TOPICAL | Status: DC | PRN
  Administered 2020-03-28: 200 mL

## 2020-03-28 MED ORDER — SCOPOLAMINE 1 MG/3DAYS TD PT72
1.0000 | MEDICATED_PATCH | TRANSDERMAL | Status: DC
  Administered 2020-03-28: 1.5 mg via TRANSDERMAL

## 2020-03-28 MED ORDER — OXYCODONE HCL 5 MG PO TABS
5.0000 mg | ORAL_TABLET | Freq: Once | ORAL | Status: AC | PRN
  Administered 2020-03-28: 5 mg via ORAL

## 2020-03-28 MED ORDER — AMISULPRIDE (ANTIEMETIC) 5 MG/2ML IV SOLN: 10.0000 mg | Freq: Once | INTRAVENOUS | Status: DC | PRN

## 2020-03-28 MED ORDER — MIDAZOLAM HCL 2 MG/2ML IJ SOLN
1.0000 mg | Freq: Once | INTRAMUSCULAR | Status: AC
  Administered 2020-03-28: 2 mg via INTRAVENOUS

## 2020-03-28 MED ORDER — ROPIVACAINE HCL 5 MG/ML IJ SOLN
INTRAMUSCULAR | Status: DC | PRN
  Administered 2020-03-28: 40 mL via PERINEURAL

## 2020-03-28 SURGICAL SUPPLY — 98 items
ANKLE SYNDEMOSIS ZIPTIGHT (Ankle) ×3 IMPLANT
BANDAGE ESMARK 6X9 LF (GAUZE/BANDAGES/DRESSINGS) ×1 IMPLANT
BIT DRILL 110X2.5XQCK CNCT (BIT) IMPLANT
BIT DRILL 2.5 (BIT) ×3
BIT DRILL 2.7XCANN QCK CNCT (BIT) IMPLANT
BIT DRILL CANN 2.7 (BIT) ×2
BIT DRILL CANN 2.7MM (BIT) ×1
BIT DRILL QC 110 3.5 (BIT) ×1
BIT DRILL QC 110 3.5MM (BIT) IMPLANT
BIT DRL 110X2.5XQCK CNCT (BIT) ×1
BIT DRL 2.7XCANN QCK CNCT (BIT) ×1
BLADE SURG 15 STRL LF DISP TIS (BLADE) ×3 IMPLANT
BLADE SURG 15 STRL SS (BLADE) ×9
BNDG CMPR 9X6 STRL LF SNTH (GAUZE/BANDAGES/DRESSINGS) ×1
BNDG COHESIVE 4X5 TAN STRL (GAUZE/BANDAGES/DRESSINGS) ×3 IMPLANT
BNDG ELASTIC 4X5.8 VLCR STR LF (GAUZE/BANDAGES/DRESSINGS) ×3 IMPLANT
BNDG ELASTIC 6X5.8 VLCR STR LF (GAUZE/BANDAGES/DRESSINGS) ×3 IMPLANT
BNDG ESMARK 6X9 LF (GAUZE/BANDAGES/DRESSINGS) ×3
CANISTER SUCT 1200ML W/VALVE (MISCELLANEOUS) ×3 IMPLANT
CLOSURE STERI-STRIP 1/2X4 (GAUZE/BANDAGES/DRESSINGS) ×2
CLSR STERI-STRIP ANTIMIC 1/2X4 (GAUZE/BANDAGES/DRESSINGS) ×2 IMPLANT
COVER BACK TABLE 60X90IN (DRAPES) ×3 IMPLANT
COVER WAND RF STERILE (DRAPES) IMPLANT
CUFF TOURN SGL QUICK 34 (TOURNIQUET CUFF) ×3
CUFF TRNQT CYL 34X4.125X (TOURNIQUET CUFF) IMPLANT
DECANTER SPIKE VIAL GLASS SM (MISCELLANEOUS) IMPLANT
DEVICE FIXATION W/ZIPLOOP (Orthopedic Implant) ×2 IMPLANT
DRAPE C-ARM 42X72 X-RAY (DRAPES) IMPLANT
DRAPE C-ARMOR (DRAPES) IMPLANT
DRAPE EXTREMITY T 121X128X90 (DISPOSABLE) ×3 IMPLANT
DRAPE IMP U-DRAPE 54X76 (DRAPES) ×3 IMPLANT
DRAPE INCISE IOBAN 66X45 STRL (DRAPES) ×3 IMPLANT
DRAPE OEC MINIVIEW 54X84 (DRAPES) ×2 IMPLANT
DRAPE U-SHAPE 47X51 STRL (DRAPES) ×3 IMPLANT
DRILL BIT QC 110 3.5MM (BIT) ×3
DRSG ADAPTIC 3X8 NADH LF (GAUZE/BANDAGES/DRESSINGS) IMPLANT
DRSG PAD ABDOMINAL 8X10 ST (GAUZE/BANDAGES/DRESSINGS) ×6 IMPLANT
DURAPREP 26ML APPLICATOR (WOUND CARE) ×3 IMPLANT
ELECT REM PT RETURN 9FT ADLT (ELECTROSURGICAL) ×3
ELECTRODE REM PT RTRN 9FT ADLT (ELECTROSURGICAL) ×1 IMPLANT
GAUZE SPONGE 4X4 12PLY STRL (GAUZE/BANDAGES/DRESSINGS) ×3 IMPLANT
GLOVE BIO SURGEON STRL SZ7 (GLOVE) ×6 IMPLANT
GLOVE BIOGEL PI IND STRL 7.0 (GLOVE) ×2 IMPLANT
GLOVE BIOGEL PI INDICATOR 7.0 (GLOVE) ×4
GLOVE ECLIPSE 6.5 STRL STRAW (GLOVE) ×2 IMPLANT
GLOVE ORTHO TXT STRL SZ7.5 (GLOVE) ×3 IMPLANT
GLOVE SRG 8 PF TXTR STRL LF DI (GLOVE) ×1 IMPLANT
GLOVE SURG UNDER POLY LF SZ7 (GLOVE) ×6 IMPLANT
GLOVE SURG UNDER POLY LF SZ8 (GLOVE) ×3
GOWN STRL REUS W/ TWL LRG LVL3 (GOWN DISPOSABLE) ×1 IMPLANT
GOWN STRL REUS W/ TWL XL LVL3 (GOWN DISPOSABLE) ×2 IMPLANT
GOWN STRL REUS W/TWL LRG LVL3 (GOWN DISPOSABLE) ×3
GOWN STRL REUS W/TWL XL LVL3 (GOWN DISPOSABLE) ×6
GUIDEWIRE PIN ORTH 6X1.6XSMTH (WIRE) ×1 IMPLANT
K-WIRE 1.6 (WIRE) ×3
NDL HYPO 25X1 1.5 SAFETY (NEEDLE) IMPLANT
NEEDLE HYPO 25X1 1.5 SAFETY (NEEDLE) IMPLANT
NS IRRIG 1000ML POUR BTL (IV SOLUTION) ×3 IMPLANT
PACK BASIN DAY SURGERY FS (CUSTOM PROCEDURE TRAY) ×3 IMPLANT
PAD CAST 4YDX4 CTTN HI CHSV (CAST SUPPLIES) ×2 IMPLANT
PADDING CAST COTTON 4X4 STRL (CAST SUPPLIES) ×6
PENCIL SMOKE EVACUATOR (MISCELLANEOUS) ×3 IMPLANT
PLATE 6HOLE 1/3 TUBULAR (Plate) ×3 IMPLANT
SCREW CANC 2.5XFT 16X4XST SM (Screw) IMPLANT
SCREW CANC 4.0X16 (Screw) ×3 IMPLANT
SCREW CANCELLOUS 4.0X18 (Screw) ×2 IMPLANT
SCREW CANN 1/3 THRD RVRS CT (Screw) IMPLANT
SCREW CANNULATED 4.0X40 (Screw) ×3 IMPLANT
SCREW CORTICAL 3.5 18MM (Screw) ×2 IMPLANT
SCREW CORTICAL 3.5X14 (Screw) ×6 IMPLANT
SHEET MEDIUM DRAPE 40X70 STRL (DRAPES) ×2 IMPLANT
SLEEVE SCD COMPRESS KNEE MED (MISCELLANEOUS) ×3 IMPLANT
SPLINT FAST PLASTER 5X30 (CAST SUPPLIES) ×40
SPLINT PLASTER CAST FAST 5X30 (CAST SUPPLIES) IMPLANT
SPONGE LAP 4X18 RFD (DISPOSABLE) ×3 IMPLANT
STAPLER VISISTAT 35W (STAPLE) IMPLANT
SUCTION FRAZIER HANDLE 10FR (MISCELLANEOUS) ×3
SUCTION TUBE FRAZIER 10FR DISP (MISCELLANEOUS) ×1 IMPLANT
SUT ETHILON 3 0 PS 1 (SUTURE) IMPLANT
SUT ETHILON 4 0 PS 2 18 (SUTURE) IMPLANT
SUT MNCRL AB 4-0 PS2 18 (SUTURE) IMPLANT
SUT VIC AB 0 CT1 27 (SUTURE) ×3
SUT VIC AB 0 CT1 27XBRD ANBCTR (SUTURE) IMPLANT
SUT VIC AB 2-0 CT1 27 (SUTURE) ×3
SUT VIC AB 2-0 CT1 TAPERPNT 27 (SUTURE) IMPLANT
SUT VIC AB 2-0 SH 18 (SUTURE) IMPLANT
SUT VIC AB 3-0 SH 27 (SUTURE) ×6
SUT VIC AB 3-0 SH 27X BRD (SUTURE) IMPLANT
SUT VICRYL 3-0 CR8 SH (SUTURE) IMPLANT
SYR BULB EAR ULCER 3OZ GRN STR (SYRINGE) ×3 IMPLANT
SYR CONTROL 10ML LL (SYRINGE) IMPLANT
SYSTEM FIXATN ANKL SYNDESMOSIS (Ankle) IMPLANT
TOWEL GREEN STERILE FF (TOWEL DISPOSABLE) ×3 IMPLANT
TUBE CONNECTING 20'X1/4 (TUBING) ×1
TUBE CONNECTING 20X1/4 (TUBING) ×2 IMPLANT
UNDERPAD 30X36 HEAVY ABSORB (UNDERPADS AND DIAPERS) ×3 IMPLANT
WASHER SM (Washer) ×3 IMPLANT
YANKAUER SUCT BULB TIP NO VENT (SUCTIONS) ×3 IMPLANT

## 2020-03-28 NOTE — Anesthesia Procedure Notes (Addendum)
Anesthesia Regional Block: Adductor canal block   Pre-Anesthetic Checklist: ,, timeout performed, Correct Patient, Correct Site, Correct Laterality, Correct Procedure, Correct Position, site marked, Risks and benefits discussed,  Surgical consent,  Pre-op evaluation,  At surgeon's request and post-op pain management  Laterality: Right  Prep: chloraprep       Needles:  Injection technique: Single-shot  Needle Type: Echogenic Stimulator Needle     Needle Length: 10cm  Needle Gauge: 20     Additional Needles:   Procedures:,,,, ultrasound used (permanent image in chart),,,,  Narrative:  Start time: 03/28/2020 12:05 PM End time: 03/28/2020 12:15 PM Injection made incrementally with aspirations every 5 mL.  Performed by: Personally  Anesthesiologist: Merlinda Frederick, MD  Additional Notes: A functioning IV was confirmed and monitors were applied.  Sterile prep and drape, hand hygiene and sterile gloves were used.  Negative aspiration and test dose prior to incremental administration of local anesthetic. The patient tolerated the procedure well.Ultrasound  guidance: relevant anatomy identified, needle position confirmed, local anesthetic spread visualized around nerve(s), vascular puncture avoided.  Image printed for medical record.

## 2020-03-28 NOTE — Discharge Instructions (Signed)
Oxycodone given at 4:15pm. Next dose can be given at 8:15pm if needed. Next dose of Tylenol can be given at 5:45pm if needed.   Diet: As you were doing prior to hospitalization   Shower:  May shower but keep the wounds dry, use an occlusive plastic wrap, NO SOAKING IN TUB.  If the bandage gets wet, change with a clean dry gauze.  If you have a splint on, leave the splint in place and keep the splint dry with a plastic bag.  Dressing:  You may change your dressing 3-5 days after surgery, unless you have a splint.  If you have a splint, then just leave the splint in place and we will change your bandages during your first follow-up appointment.    If you had hand or foot surgery, we will plan to remove your stitches in about 2 weeks in the office.  For all other surgeries, there are sticky tapes (steri-strips) on your wounds and all the stitches are absorbable.  Leave the steri-strips in place when changing your dressings, they will peel off with time, usually 2-3 weeks.  Activity:  Increase activity slowly as tolerated, but follow the weight bearing instructions below.  The rules on driving is that you can not be taking narcotics while you drive, and you must feel in control of the vehicle.    Weight Bearing:   No weight bearing on right ankle.    To prevent constipation: you may use a stool softener such as -  Colace (over the counter) 100 mg by mouth twice a day  Drink plenty of fluids (prune juice may be helpful) and high fiber foods Miralax (over the counter) for constipation as needed.    Itching:  If you experience itching with your medications, try taking only a single pain pill, or even half a pain pill at a time.  You may take up to 10 pain pills per day, and you can also use benadryl over the counter for itching or also to help with sleep.   Precautions:  If you experience chest pain or shortness of breath - call 911 immediately for transfer to the hospital emergency  department!!  If you develop a fever greater that 101 F, purulent drainage from wound, increased redness or drainage from wound, or calf pain -- Call the office at [phone]                                                Follow- Up Appointment:  Please call for an appointment to be seen in 2 weeks [place] - [phone]    Post Anesthesia Home Care Instructions  Activity: Get plenty of rest for the remainder of the day. A responsible individual must stay with you for 24 hours following the procedure.  For the next 24 hours, DO NOT: -Drive a car -Paediatric nurse -Drink alcoholic beverages -Take any medication unless instructed by your physician -Make any legal decisions or sign important papers.  Meals: Start with liquid foods such as gelatin or soup. Progress to regular foods as tolerated. Avoid greasy, spicy, heavy foods. If nausea and/or vomiting occur, drink only clear liquids until the nausea and/or vomiting subsides. Call your physician if vomiting continues.  Special Instructions/Symptoms: Your throat may feel dry or sore from the anesthesia or the breathing tube placed in your throat during surgery. If this  causes discomfort, gargle with warm salt water. The discomfort should disappear within 24 hours.  If you had a scopolamine patch placed behind your ear for the management of post- operative nausea and/or vomiting:  1. The medication in the patch is effective for 72 hours, after which it should be removed.  Wrap patch in a tissue and discard in the trash. Wash hands thoroughly with soap and water. 2. You may remove the patch earlier than 72 hours if you experience unpleasant side effects which may include dry mouth, dizziness or visual disturbances. 3. Avoid touching the patch. Wash your hands with soap and water after contact with the patch.    Regional Anesthesia Blocks  1. Numbness or the inability to move the "blocked" extremity may last from 3-48 hours after  placement. The length of time depends on the medication injected and your individual response to the medication. If the numbness is not going away after 48 hours, call your surgeon.  2. The extremity that is blocked will need to be protected until the numbness is gone and the  Strength has returned. Because you cannot feel it, you will need to take extra care to avoid injury. Because it may be weak, you may have difficulty moving it or using it. You may not know what position it is in without looking at it while the block is in effect.  3. For blocks in the legs and feet, returning to weight bearing and walking needs to be done carefully. You will need to wait until the numbness is entirely gone and the strength has returned. You should be able to move your leg and foot normally before you try and bear weight or walk. You will need someone to be with you when you first try to ensure you do not fall and possibly risk injury.  4. Bruising and tenderness at the needle site are common side effects and will resolve in a few days.  5. Persistent numbness or new problems with movement should be communicated to the surgeon or the [place] [phone] [place] [phone]).

## 2020-03-28 NOTE — Op Note (Addendum)
03/28/2020  PATIENT:  [name]    PRE-OPERATIVE DIAGNOSIS: Right trimalleolar ankle fracture, recent dislocation  POST-OPERATIVE DIAGNOSIS: Right trimalleolar ankle fracture with syndesmotic disruption  PROCEDURE: 1.  Open reduction internal fixation right trimalleolar ankle fracture without fixation of posterior lip 2.  Open reduction internal fixation right syndesmosis 3.  3 views plus a stress view of the right ankle taken intraoperatively demonstrate near anatomic alignment postoperatively, with intraoperative widening of the syndesmosis     SURGEON:  Johnny Bridge, MD  PHYSICIAN ASSISTANT: Merlene Pulling, PA-C, present and scrubbed throughout the case, critical for completion in a timely fashion, and for retraction, instrumentation, and closure.  ANESTHESIA:   General with regional block  ESTIMATED BLOOD LOSS: 75 mL  PREOPERATIVE INDICATIONS:  [name] is a  49 y.o. female who had a right trimalleolar ankle fracture dislocation with just a small piece off of the posterior malleolus who underwent closed reduction in the emergency room and then was referred to my office.  She was then electively scheduled for surgical intervention to definitively manage her complex ankle fracture dislocation.  The risks benefits and alternatives were discussed with the patient preoperatively including but not limited to the risks of infection, bleeding, nerve injury, cardiopulmonary complications, the need for revision surgery, the need for hardware removal, among others, and the patient was willing to proceed.  OPERATIVE IMPLANTS: 1/3 tubular plate, with a single interfragmentary lag screw, and 1 4.0 mm cannulated screw for the medial malleolus.  I also used a stainless steel zip tight for the syndesmosis.  I had to remove the interfragmentary lag screw because it was exactly in the way of the syndesmotic fixation.  Additionally, its purchase was fairly poor.  OPERATIVE PROCEDURE: The  patient was brought to the operating room and placed in the supine position. All bony prominences were padded. General anesthesia was administered. The lower extremity was prepped and draped in the usual sterile fashion. The leg was elevated and exsanguinated and the tourniquet was inflated. Time out was performed.   Incision was made over the distal fibula and the fracture was exposed and reduced anatomically with a clamp. A lag screw was placed.  This held, but did not have great purchase.  I then applied a 1/3 tubular locking plate and secured it proximally and distally with non-locking screws. Bone quality was poor. I used c-arm to confirm satisfactory reduction and fixation.  I measured off of the distal cancellous screws, which measured 16, but given her bone quality I went with an 18 mm length and a 16 mm length, that gave just a small bit of purchase into the cortex on the medial side of the distal fibula, but was not entering the gutter based on live fluoroscopy.  I then turned my attention to the medial malleolus. Incision was made over the medial malleolus and the fracture exposed and held provisionally with a clamp.  A single guidepin was placed for the 4.0 mm cannulated screw and then confirmation of reduction was made with fluoroscopy.  My initially trajectory was too anterior, so I replaced this to be more midline within the tibia.  I then placed a single 40 mm cannulated screw with a washer and had excellent fixation.  The syndesmosis was stressed using live fluoroscopy and found to be slightly unstable.  Therefore I placed a zip tight syndesmotic fixation device, and upon my first pass, I was just sliding by the lag screw, and in fact I hit the medial  malleolus screw, and I had to adjust the trajectory which ended up placing the zip tight in a suboptimal position, which was far too posterior.  I was concerned about entrapment of the posterior tibial tendon, so I remove that zip tight, and  then placed a second zip tight more anteriorly, after removing the lag screw, which did not have good purchase to begin with, and was more blocking my optimal trajectory than anything else.  I scrutinized the talus on the lateral view, I was a little concerned I was a little more open posteriorly, however I did not over tighten the syndesmosis, and the tibiotalar congruency seemed near anatomic under live fluoroscopy.  I released the tourniquet prior to final closure, and had appropriate hemostasis.  The wounds were irrigated, and closed with vicryl with routine closure for the skin. The wounds were injected with local anesthetic. Sterile gauze was applied followed by a posterior splint. She was awakened and returned to the PACU in stable and satisfactory condition. There were no complications.

## 2020-03-28 NOTE — Transfer of Care (Signed)
Immediate Anesthesia Transfer of Care Note  Patient: [name]  Procedure(s) Performed: OPEN REDUCTION INTERNAL FIXATION (ORIF) RIGHT BIMALLEOLAR ANKLE FRACTURE (Right Ankle)  Patient Location: PACU  Anesthesia Type:GA combined with regional for post-op pain  Level of Consciousness: awake, alert  and oriented  Airway & Oxygen Therapy: Patient Spontanous Breathing and Patient connected to face mask oxygen  Post-op Assessment: Report given to RN and Post -op Vital signs reviewed and stable  Post vital signs: Reviewed and stable  Last Vitals:  Vitals Value Taken Time  BP 154/86 03/28/20 1517  Temp    Pulse 119 03/28/20 1520  Resp 16 03/28/20 1520  SpO2 99 % 03/28/20 1520  Vitals shown include unvalidated device data.  Last Pain:  Vitals:   03/28/20 1137  PainSc: 7       Patients Stated Pain Goal: 4 (49/75/30 0511)  Complications: No complications documented.

## 2020-03-28 NOTE — Anesthesia Procedure Notes (Signed)
Procedure Name: Intubation Date/Time: 03/28/2020 1:00 PM Performed by: Jonna Munro, CRNA Pre-anesthesia Checklist: Patient identified, Emergency Drugs available, Suction available, Patient being monitored and Timeout performed Patient Re-evaluated:Patient Re-evaluated prior to induction Oxygen Delivery Method: Circle system utilized Preoxygenation: Pre-oxygenation with 100% oxygen Induction Type: IV induction Ventilation: Mask ventilation without difficulty Laryngoscope Size: Mac and 3 Grade View: Grade I Tube type: Oral Tube size: 7.0 mm Number of attempts: 1 Airway Equipment and Method: Stylet Placement Confirmation: positive ETCO2,  breath sounds checked- equal and bilateral and ETT inserted through vocal cords under direct vision Secured at: 21 cm Tube secured with: Tape Dental Injury: Teeth and Oropharynx as per pre-operative assessment

## 2020-03-28 NOTE — Anesthesia Procedure Notes (Signed)
Anesthesia Regional Block: Popliteal block   Pre-Anesthetic Checklist: ,, timeout performed, Correct Patient, Correct Site, Correct Laterality, Correct Procedure, Correct Position, site marked, Risks and benefits discussed,  Surgical consent,  Pre-op evaluation,  At surgeon's request and post-op pain management  Laterality: Right  Prep: chloraprep       Needles:  Injection technique: Single-shot  Needle Type: Echogenic Stimulator Needle     Needle Length: 10cm  Needle Gauge: 20     Additional Needles:   Procedures:,,,, ultrasound used (permanent image in chart),,,,  Narrative:  Start time: 03/28/2020 12:05 PM End time: 03/28/2020 12:15 PM Injection made incrementally with aspirations every 5 mL.  Performed by: Personally  Anesthesiologist: Merlinda Frederick, MD  Additional Notes: A functioning IV was confirmed and monitors were applied.  Sterile prep and drape, hand hygiene and sterile gloves were used.  Negative aspiration and test dose prior to incremental administration of local anesthetic. The patient tolerated the procedure well.Ultrasound  guidance: relevant anatomy identified, needle position confirmed, local anesthetic spread visualized around nerve(s), vascular puncture avoided.  Image printed for medical record.

## 2020-03-28 NOTE — Anesthesia Preprocedure Evaluation (Signed)
Anesthesia Evaluation  Patient identified by MRN, date of birth, ID band Patient awake    Reviewed: Allergy & Precautions, NPO status , Patient's Chart, lab work & pertinent test results  History of Anesthesia Complications Negative for: history of anesthetic complications  Airway Mallampati: I  TM Distance: >3 FB Neck ROM: Full    Dental  (+) Edentulous Upper, Edentulous Lower   Pulmonary neg pulmonary ROS,  12/26/2018 SARS coronavirus NEG   breath sounds clear to auscultation       Cardiovascular hypertension, Pt. on medications (-) angina Rhythm:Regular Rate:Normal  '16 ECHO: EF 55-60%, valves OK   Neuro/Psych  Headaches, Seizures - (last 2 years ago), Well Controlled,  PSYCHIATRIC DISORDERS Anxiety Depression Bipolar Disorder Chronic back pain    GI/Hepatic Neg liver ROS, GERD  Medicated and Controlled,  Endo/Other  Morbid obesity  Renal/GU      Musculoskeletal  (+) Arthritis , Osteoarthritis,    Abdominal (+) + obese,   Peds  Hematology  (+) Blood dyscrasia, anemia ,   Anesthesia Other Findings   Reproductive/Obstetrics                            Anesthesia Physical  Anesthesia Plan  ASA: III  Anesthesia Plan: General   Post-op Pain Management:    Induction: Intravenous  PONV Risk Score and Plan: 3 and Treatment may vary due to age or medical condition and Ondansetron  Airway Management Planned: Oral ETT  Additional Equipment:   Intra-op Plan:   Post-operative Plan:   Informed Consent: I have reviewed the patients History and Physical, chart, labs and discussed the procedure including the risks, benefits and alternatives for the proposed anesthesia with the patient or authorized representative who has indicated his/her understanding and acceptance.       Plan Discussed with: CRNA and Surgeon  Anesthesia Plan Comments: (+ nausea. GETA. RSI with Sux. )        Anesthesia Quick Evaluation

## 2020-03-28 NOTE — Anesthesia Postprocedure Evaluation (Signed)
Anesthesia Post Note  Patient: [name]  Procedure(s) Performed: OPEN REDUCTION INTERNAL FIXATION (ORIF) RIGHT BIMALLEOLAR ANKLE FRACTURE (Right Ankle)     Patient location during evaluation: PACU Anesthesia Type: General and Regional Level of consciousness: awake and alert Pain management: pain level controlled Vital Signs Assessment: post-procedure vital signs reviewed and stable Respiratory status: spontaneous breathing, nonlabored ventilation, respiratory function stable and patient connected to nasal cannula oxygen Cardiovascular status: blood pressure returned to baseline and stable Postop Assessment: no apparent nausea or vomiting Anesthetic complications: no Comments: Patient back to baseline room air sat and heart rate.   No complications documented.  Last Vitals:  Vitals:   03/28/20 1545 03/28/20 1600  BP: 140/79 (!) 146/88  Pulse: (!) 116 (!) 113  Resp: 19 18  Temp:    SpO2: 91% 92%    Last Pain:  Vitals:   03/28/20 1615  PainSc: 5                  Candra R Azriella Mattia

## 2020-03-28 NOTE — H&P (Signed)
PREOPERATIVE H&P  Chief Complaint: right ankle pain  HPI: [name] is a 49 y.o. female who presents for preoperative history and physical with a diagnosis of right ankle fracture. Symptoms are rated as moderate to severe, and have been worsening.  This is significantly impairing activities of daily living.  She has elected for surgical management. She initially had a right ankle fracture dislocation that underwent closed reduction by Hilbert Odor, PA-C on November 29.  She then came to our office for follow-up.  Past Medical History:  Diagnosis Date  . Anxiety   . Arthritis    "joints ache all over" (10/15/2014)  . Bulging lumbar disc   . Chronic lower back pain   . DDD (degenerative disc disease), cervical   . Depression   . Drug-seeking behavior   . Headache    "weekly" (10/15/2014)  . Hyperlipemia   . Hypertension   . PTSD (post-traumatic stress disorder)   . Skin cancer    "had them cut off my arms; don't know what kind"   Past Surgical History:  Procedure Laterality Date  . ABLATION ON ENDOMETRIOSIS  2008  . BIOPSY  12/27/2018   Procedure: BIOPSY;  Surgeon: Thornton Park, MD;  Location: [facility];  Service: Gastroenterology;;  . ESOPHAGOGASTRODUODENOSCOPY (EGD) WITH PROPOFOL N/A 12/27/2018   Procedure: ESOPHAGOGASTRODUODENOSCOPY (EGD) WITH PROPOFOL;  Surgeon: Thornton Park, MD;  Location: [facility];  Service: Gastroenterology;  Laterality: N/A;  . HEMORRHOID SURGERY  ~ 2002   Social History   Socioeconomic History  . Marital status: Married    Spouse name: Not on file  . Number of children: Not on file  . Years of education: Not on file  . Highest education level: Not on file  Occupational History  . Not on file  Tobacco Use  . Smoking status: Never Smoker  . Smokeless tobacco: Never Used  Vaping Use  . Vaping Use: Never used  Substance and Sexual Activity  . Alcohol use: Not Currently    Comment: 10/15/2014 "I've drank before; nothing regular;  don't drink now cause of RX I'm on"  . Drug use: No  . Sexual activity: Not Currently  Other Topics Concern  . Not on file  Social History Narrative   [place]. Never smoked; quit alcohol 10 years; used to work for [employer]. NO IV drug abuse.    Social Determinants of Health   Financial Resource Strain:   . Difficulty of Paying Living Expenses: Not on file  Food Insecurity:   . Worried About Charity fundraiser in the Last Year: Not on file  . Ran Out of Food in the Last Year: Not on file  Transportation Needs:   . Lack of Transportation (Medical): Not on file  . Lack of Transportation (Non-Medical): Not on file  Physical Activity:   . Days of Exercise per Week: Not on file  . Minutes of Exercise per Session: Not on file  Stress:   . Feeling of Stress : Not on file  Social Connections:   . Frequency of Communication with Friends and Family: Not on file  . Frequency of Social Gatherings with Friends and Family: Not on file  . Attends Religious Services: Not on file  . Active Member of Clubs or Organizations: Not on file  . Attends Archivist Meetings: Not on file  . Marital Status: Not on file   Family History  Problem Relation Age of Onset  . Breast  cancer Mother   . Diabetes Mother   . Breast cancer Maternal Grandmother   . Breast cancer Paternal Grandmother   . Colon polyps Paternal Grandmother   . Colon cancer Neg Hx   . Esophageal cancer Neg Hx   . Rectal cancer Neg Hx   . Stomach cancer Neg Hx    Allergies  Allergen Reactions  . Lactose Intolerance (Gi) Nausea Only   Prior to Admission medications   Medication Sig Start Date End Date Taking? Authorizing Provider  acetaminophen (TYLENOL) 325 MG tablet Take 2 tablets (650 mg total) by mouth every 6 (six) hours as needed for mild pain (or Fever >/= 101). 12/28/18  Yes Lama, Marge Duncans, MD  amitriptyline (ELAVIL) 50 MG tablet TAKE 1 TABLET BY MOUTH AT BEDTIME  07/23/19  Yes Thornton Park, MD  FLUoxetine (PROZAC) 40 MG capsule Take 40 mg by mouth daily.  06/26/17  Yes [provider]  hydrochlorothiazide (HYDRODIURIL) 25 MG tablet Take 25 mg by mouth daily.  06/26/17  Yes [provider]  lamoTRIgine (LAMICTAL) 150 MG tablet Take 150 mg by mouth 2 (two) times daily.   Yes [provider]  lisinopril (ZESTRIL) 10 MG tablet Take 10 mg by mouth daily.    Yes [provider]  methocarbamol (ROBAXIN) 500 MG tablet Take 1 tablet (500 mg total) by mouth 2 (two) times daily. 03/21/20  Yes Joy, Shawn C, PA-C  omeprazole (PRILOSEC) 20 MG capsule Take 20 mg by mouth daily.   Yes [provider]  ondansetron (ZOFRAN) 4 MG tablet Take 1 tablet (4 mg total) by mouth every 6 (six) hours as needed for nausea. 12/28/18  Yes Oswald Hillock, MD  oxyCODONE-acetaminophen (PERCOCET/ROXICET) 5-325 MG tablet Take 1-2 tablets by mouth every 6 (six) hours as needed for severe pain. 03/21/20  Yes Joy, Shawn C, PA-C  risperiDONE (RISPERDAL) 0.5 MG tablet Take 0.5 mg by mouth 2 (two) times daily. 03/03/18  Yes [provider]  tiZANidine (ZANAFLEX) 4 MG tablet Take 8 mg by mouth 3 (three) times daily.    Yes [provider]  traZODone (DESYREL) 150 MG tablet Take 150 mg by mouth at bedtime.  06/03/17  Yes [provider]     Positive ROS: All other systems have been reviewed and were otherwise negative with the exception of those mentioned in the HPI and as above.  Physical Exam: General: Alert, no acute distress Cardiovascular: No pedal edema Respiratory: No cyanosis, no use of accessory musculature GI: No organomegaly, abdomen is soft and non-tender Skin: No lesions in the area of chief complaint Neurologic: Sensation intact distally Psychiatric: Patient is competent for consent with normal mood and affect Lymphatic: No axillary or cervical lymphadenopathy  MUSCULOSKELETAL: Right ankle has positive soft  tissue swelling, but no fracture blisters, pain to palpation medially and laterally.  Assessment: Right bimalleolar ankle fracture   Plan: Plan for Procedure(s): OPEN REDUCTION INTERNAL FIXATION (ORIF) RIGHT BIMALLEOLAR ANKLE FRACTURE  The risks benefits and alternatives were discussed with the patient including but not limited to the risks of nonoperative treatment, versus surgical intervention including infection, bleeding, nerve injury, malunion, nonunion, the need for revision surgery, hardware prominence, hardware failure, the need for hardware removal, blood clots, cardiopulmonary complications, morbidity, mortality, among others, and they were willing to proceed.      Johnny Bridge, MD Cell [phone]   03/28/2020 12:25 PM

## 2020-03-28 NOTE — Progress Notes (Signed)
Assisted Dr. Elgie Congo with left, ultrasound guided, popliteal, adductor canal block. Side rails up, monitors on throughout procedure. See vital signs in flow sheet. Tolerated Procedure well.

## 2020-03-29 ENCOUNTER — Encounter (HOSPITAL_BASED_OUTPATIENT_CLINIC_OR_DEPARTMENT_OTHER): Payer: Self-pay | Admitting: Orthopedic Surgery

## 2020-03-30 NOTE — Addendum Note (Signed)
Addendum  created 03/30/20 1527 by Merlinda Frederick, MD   Clinical Note Signed, Intraprocedure Blocks edited

## 2020-04-11 DIAGNOSIS — S82851D Displaced trimalleolar fracture of right lower leg, subsequent encounter for closed fracture with routine healing: Secondary | ICD-10-CM | POA: Diagnosis not present

## 2020-05-10 ENCOUNTER — Emergency Department (HOSPITAL_COMMUNITY): Payer: Medicare Other

## 2020-05-10 ENCOUNTER — Inpatient Hospital Stay (HOSPITAL_COMMUNITY)
Admission: EM | Admit: 2020-05-10 | Discharge: 2020-05-12 | DRG: 178 | Disposition: A | Discharge status: Home / Self Care | Payer: Medicare Other | Attending: Internal Medicine | Admitting: Internal Medicine

## 2020-05-10 ENCOUNTER — Encounter (HOSPITAL_COMMUNITY): Payer: Self-pay

## 2020-05-10 DIAGNOSIS — M1909 Primary osteoarthritis, other specified site: Secondary | ICD-10-CM | POA: Diagnosis present

## 2020-05-10 DIAGNOSIS — I10 Essential (primary) hypertension: Secondary | ICD-10-CM | POA: Diagnosis present

## 2020-05-10 DIAGNOSIS — K5900 Constipation, unspecified: Secondary | ICD-10-CM

## 2020-05-10 DIAGNOSIS — N179 Acute kidney failure, unspecified: Secondary | ICD-10-CM | POA: Diagnosis present

## 2020-05-10 DIAGNOSIS — A084 Viral intestinal infection, unspecified: Secondary | ICD-10-CM | POA: Diagnosis present

## 2020-05-10 DIAGNOSIS — Z6841 Body Mass Index (BMI) 40.0 and over, adult: Secondary | ICD-10-CM | POA: Diagnosis not present

## 2020-05-10 DIAGNOSIS — R1110 Vomiting, unspecified: Secondary | ICD-10-CM | POA: Diagnosis not present

## 2020-05-10 DIAGNOSIS — A0839 Other viral enteritis: Secondary | ICD-10-CM | POA: Diagnosis present

## 2020-05-10 DIAGNOSIS — Z765 Malingerer [conscious simulation]: Secondary | ICD-10-CM | POA: Diagnosis not present

## 2020-05-10 DIAGNOSIS — F32A Depression, unspecified: Secondary | ICD-10-CM | POA: Diagnosis present

## 2020-05-10 DIAGNOSIS — R519 Headache, unspecified: Secondary | ICD-10-CM | POA: Diagnosis present

## 2020-05-10 DIAGNOSIS — R0689 Other abnormalities of breathing: Secondary | ICD-10-CM | POA: Diagnosis not present

## 2020-05-10 DIAGNOSIS — E860 Dehydration: Secondary | ICD-10-CM | POA: Diagnosis present

## 2020-05-10 DIAGNOSIS — K760 Fatty (change of) liver, not elsewhere classified: Secondary | ICD-10-CM | POA: Diagnosis present

## 2020-05-10 DIAGNOSIS — R1084 Generalized abdominal pain: Secondary | ICD-10-CM | POA: Diagnosis present

## 2020-05-10 DIAGNOSIS — E785 Hyperlipidemia, unspecified: Secondary | ICD-10-CM | POA: Diagnosis present

## 2020-05-10 DIAGNOSIS — Z85828 Personal history of other malignant neoplasm of skin: Secondary | ICD-10-CM

## 2020-05-10 DIAGNOSIS — E739 Lactose intolerance, unspecified: Secondary | ICD-10-CM | POA: Diagnosis present

## 2020-05-10 DIAGNOSIS — G8929 Other chronic pain: Secondary | ICD-10-CM | POA: Diagnosis present

## 2020-05-10 DIAGNOSIS — U071 COVID-19: Secondary | ICD-10-CM | POA: Diagnosis present

## 2020-05-10 DIAGNOSIS — F419 Anxiety disorder, unspecified: Secondary | ICD-10-CM | POA: Diagnosis present

## 2020-05-10 DIAGNOSIS — R110 Nausea: Secondary | ICD-10-CM | POA: Diagnosis not present

## 2020-05-10 DIAGNOSIS — Z79899 Other long term (current) drug therapy: Secondary | ICD-10-CM

## 2020-05-10 DIAGNOSIS — R112 Nausea with vomiting, unspecified: Secondary | ICD-10-CM | POA: Diagnosis not present

## 2020-05-10 DIAGNOSIS — F4310 Post-traumatic stress disorder, unspecified: Secondary | ICD-10-CM | POA: Diagnosis present

## 2020-05-10 DIAGNOSIS — R197 Diarrhea, unspecified: Secondary | ICD-10-CM | POA: Diagnosis not present

## 2020-05-10 DIAGNOSIS — R000 Tachycardia, unspecified: Secondary | ICD-10-CM | POA: Diagnosis not present

## 2020-05-10 DIAGNOSIS — K5904 Chronic idiopathic constipation: Secondary | ICD-10-CM | POA: Diagnosis not present

## 2020-05-10 LAB — I-STAT VENOUS BLOOD GAS, ED
Acid-Base Excess: 4 mmol/L — ABNORMAL HIGH (ref 0.0–2.0)
Bicarbonate: 26.3 mmol/L (ref 20.0–28.0)
Calcium, Ion: 1.05 mmol/L — ABNORMAL LOW (ref 1.15–1.40)
HCT: 43 % (ref 36.0–46.0)
Hemoglobin: 14.6 g/dL (ref 12.0–15.0)
O2 Saturation: 99 %
Potassium: 3 mmol/L — ABNORMAL LOW (ref 3.5–5.1)
Sodium: 141 mmol/L (ref 135–145)
TCO2: 27 mmol/L (ref 22–32)
pCO2, Ven: 30.7 mmHg — ABNORMAL LOW (ref 44.0–60.0)
pH, Ven: 7.542 — ABNORMAL HIGH (ref 7.250–7.430)
pO2, Ven: 129 mmHg — ABNORMAL HIGH (ref 32.0–45.0)

## 2020-05-10 LAB — TRIGLYCERIDES: Triglycerides: 185 mg/dL — ABNORMAL HIGH (ref <150)

## 2020-05-10 LAB — COMPREHENSIVE METABOLIC PANEL
ALT: 26 U/L (ref 0–44)
ALT: 22 U/L (ref 0–44)
AST: 34 U/L (ref 15–41)
AST: 31 U/L (ref 15–41)
Albumin: 4.6 g/dL (ref 3.5–5.0)
Albumin: 4.1 g/dL (ref 3.5–5.0)
Alkaline Phosphatase: 95 U/L (ref 38–126)
Alkaline Phosphatase: 78 U/L (ref 38–126)
Anion gap: 27 — ABNORMAL HIGH (ref 5–15)
Anion gap: 19 — ABNORMAL HIGH (ref 5–15)
BUN: 12 mg/dL (ref 6–20)
BUN: 13 mg/dL (ref 6–20)
CO2: 17 mmol/L — ABNORMAL LOW (ref 22–32)
CO2: 23 mmol/L (ref 22–32)
Calcium: 10.4 mg/dL — ABNORMAL HIGH (ref 8.9–10.3)
Calcium: 9.3 mg/dL (ref 8.9–10.3)
Chloride: 93 mmol/L — ABNORMAL LOW (ref 98–111)
Chloride: 99 mmol/L (ref 98–111)
Creatinine, Ser: 1.73 mg/dL — ABNORMAL HIGH (ref 0.44–1.00)
Creatinine, Ser: 1.67 mg/dL — ABNORMAL HIGH (ref 0.44–1.00)
GFR, Estimated: 36 mL/min — ABNORMAL LOW (ref >60)
GFR, Estimated: 37 mL/min — ABNORMAL LOW (ref >60)
Glucose, Bld: 199 mg/dL — ABNORMAL HIGH (ref 70–99)
Glucose, Bld: 145 mg/dL — ABNORMAL HIGH (ref 70–99)
Potassium: 3.1 mmol/L — ABNORMAL LOW (ref 3.5–5.1)
Potassium: 2.9 mmol/L — ABNORMAL LOW (ref 3.5–5.1)
Sodium: 137 mmol/L (ref 135–145)
Sodium: 141 mmol/L (ref 135–145)
Total Bilirubin: 0.8 mg/dL (ref 0.3–1.2)
Total Bilirubin: 0.8 mg/dL (ref 0.3–1.2)
Total Protein: 9 g/dL — ABNORMAL HIGH (ref 6.5–8.1)
Total Protein: 7.7 g/dL (ref 6.5–8.1)

## 2020-05-10 LAB — I-STAT BETA HCG BLOOD, ED (MC, WL, AP ONLY): I-stat hCG, quantitative: <5 m[IU]/mL (ref <5)

## 2020-05-10 LAB — CBC
HCT: 46.9 % — ABNORMAL HIGH (ref 36.0–46.0)
HCT: 41.5 % (ref 36.0–46.0)
Hemoglobin: 16.6 g/dL — ABNORMAL HIGH (ref 12.0–15.0)
Hemoglobin: 14.7 g/dL (ref 12.0–15.0)
MCH: 32.2 pg (ref 26.0–34.0)
MCH: 32.2 pg (ref 26.0–34.0)
MCHC: 35.4 g/dL (ref 30.0–36.0)
MCHC: 35.4 g/dL (ref 30.0–36.0)
MCV: 90.9 fL (ref 80.0–100.0)
MCV: 90.8 fL (ref 80.0–100.0)
Platelets: 445 10*3/uL — ABNORMAL HIGH (ref 150–400)
Platelets: 376 10*3/uL (ref 150–400)
RBC: 5.16 MIL/uL — ABNORMAL HIGH (ref 3.87–5.11)
RBC: 4.57 MIL/uL (ref 3.87–5.11)
RDW: 11.9 % (ref 11.5–15.5)
RDW: 12.2 % (ref 11.5–15.5)
WBC: 18.9 10*3/uL — ABNORMAL HIGH (ref 4.0–10.5)
WBC: 16.6 10*3/uL — ABNORMAL HIGH (ref 4.0–10.5)
nRBC: 0 % (ref 0.0–0.2)
nRBC: 0 % (ref 0.0–0.2)

## 2020-05-10 LAB — URINALYSIS, ROUTINE W REFLEX MICROSCOPIC
Bilirubin Urine: NEGATIVE
Glucose, UA: NEGATIVE mg/dL
Ketones, ur: 20 mg/dL — AB
Nitrite: NEGATIVE
Protein, ur: 30 mg/dL — AB
Specific Gravity, Urine: >1.046 — ABNORMAL HIGH (ref 1.005–1.030)
pH: 5 (ref 5.0–8.0)

## 2020-05-10 LAB — RESP PANEL BY RT-PCR (FLU A&B, COVID) ARPGX2
Influenza A by PCR: NEGATIVE
Influenza B by PCR: NEGATIVE
SARS Coronavirus 2 by RT PCR: POSITIVE — AB

## 2020-05-10 LAB — C-REACTIVE PROTEIN: CRP: 5.9 mg/dL — ABNORMAL HIGH (ref <1.0)

## 2020-05-10 LAB — HEPATITIS B SURFACE ANTIGEN: Hepatitis B Surface Ag: NONREACTIVE

## 2020-05-10 LAB — BRAIN NATRIURETIC PEPTIDE: B Natriuretic Peptide: 128.2 pg/mL — ABNORMAL HIGH (ref 0.0–100.0)

## 2020-05-10 LAB — LIPASE, BLOOD: Lipase: 21 U/L (ref 11–51)

## 2020-05-10 LAB — LACTATE DEHYDROGENASE: LDH: 406 U/L — ABNORMAL HIGH (ref 98–192)

## 2020-05-10 LAB — MAGNESIUM: Magnesium: 1.9 mg/dL (ref 1.7–2.4)

## 2020-05-10 LAB — TROPONIN I (HIGH SENSITIVITY): Troponin I (High Sensitivity): 43 ng/L — ABNORMAL HIGH (ref <18)

## 2020-05-10 LAB — PROCALCITONIN: Procalcitonin: 0.19 ng/mL

## 2020-05-10 LAB — LACTIC ACID, PLASMA: Lactic Acid, Venous: 1.4 mmol/L (ref 0.5–1.9)

## 2020-05-10 LAB — HIV ANTIBODY (ROUTINE TESTING W REFLEX): HIV Screen 4th Generation wRfx: NONREACTIVE

## 2020-05-10 LAB — FERRITIN: Ferritin: 89 ng/mL (ref 11–307)

## 2020-05-10 MED ORDER — KETOROLAC TROMETHAMINE 30 MG/ML IJ SOLN
30.0000 mg | Freq: Once | INTRAMUSCULAR | Status: AC
  Administered 2020-05-10: 30 mg via INTRAVENOUS
  Filled 2020-05-10: qty 1

## 2020-05-10 MED ORDER — LABETALOL HCL 5 MG/ML IV SOLN
10.0000 mg | INTRAVENOUS | Status: DC | PRN
  Administered 2020-05-10: 10 mg via INTRAVENOUS
  Filled 2020-05-10: qty 4

## 2020-05-10 MED ORDER — POTASSIUM CHLORIDE 10 MEQ/100ML IV SOLN
10.0000 meq | INTRAVENOUS | Status: AC
  Administered 2020-05-10: 10 meq via INTRAVENOUS
  Filled 2020-05-10: qty 100

## 2020-05-10 MED ORDER — METOPROLOL TARTRATE 5 MG/5ML IV SOLN
5.0000 mg | Freq: Once | INTRAVENOUS | Status: AC
  Administered 2020-05-10: 5 mg via INTRAVENOUS
  Filled 2020-05-10: qty 5

## 2020-05-10 MED ORDER — SODIUM CHLORIDE 0.9 % IV BOLUS
1000.0000 mL | Freq: Once | INTRAVENOUS | Status: AC
  Administered 2020-05-10: 1000 mL via INTRAVENOUS

## 2020-05-10 MED ORDER — TRAZODONE HCL 50 MG PO TABS
150.0000 mg | ORAL_TABLET | Freq: Every day | ORAL | Status: DC
  Administered 2020-05-10 – 2020-05-11 (×2): 150 mg via ORAL
  Filled 2020-05-10 (×2): qty 3

## 2020-05-10 MED ORDER — ONDANSETRON HCL 4 MG/2ML IJ SOLN
4.0000 mg | Freq: Once | INTRAMUSCULAR | Status: AC
  Administered 2020-05-10: 4 mg via INTRAVENOUS
  Filled 2020-05-10: qty 2

## 2020-05-10 MED ORDER — FENTANYL CITRATE (PF) 100 MCG/2ML IJ SOLN
50.0000 ug | Freq: Once | INTRAMUSCULAR | Status: AC
  Administered 2020-05-10: 50 ug via INTRAVENOUS
  Filled 2020-05-10: qty 2

## 2020-05-10 MED ORDER — HEPARIN SODIUM (PORCINE) 5000 UNIT/ML IJ SOLN
5000.0000 [IU] | Freq: Two times a day (BID) | INTRAMUSCULAR | Status: DC
  Administered 2020-05-10 – 2020-05-12 (×4): 5000 [IU] via SUBCUTANEOUS
  Filled 2020-05-10 (×4): qty 1

## 2020-05-10 MED ORDER — IOHEXOL 300 MG/ML  SOLN
100.0000 mL | Freq: Once | INTRAMUSCULAR | Status: AC | PRN
  Administered 2020-05-10: 80 mL via INTRAVENOUS

## 2020-05-10 MED ORDER — ACETAMINOPHEN 325 MG PO TABS: 650.0000 mg | ORAL_TABLET | Freq: Four times a day (QID) | ORAL | Status: DC | PRN

## 2020-05-10 MED ORDER — RISPERIDONE 0.5 MG PO TABS
0.5000 mg | ORAL_TABLET | Freq: Two times a day (BID) | ORAL | Status: DC
  Administered 2020-05-10 – 2020-05-12 (×4): 0.5 mg via ORAL
  Filled 2020-05-10 (×5): qty 1

## 2020-05-10 MED ORDER — LAMOTRIGINE 100 MG PO TABS
200.0000 mg | ORAL_TABLET | Freq: Two times a day (BID) | ORAL | Status: DC
  Administered 2020-05-10 – 2020-05-12 (×4): 200 mg via ORAL
  Filled 2020-05-10 (×5): qty 2

## 2020-05-10 MED ORDER — GUAIFENESIN-DM 100-10 MG/5ML PO SYRP
10.0000 mL | ORAL_SOLUTION | ORAL | Status: DC | PRN
  Administered 2020-05-12: 10 mL via ORAL
  Filled 2020-05-10 (×2): qty 10

## 2020-05-10 MED ORDER — METOCLOPRAMIDE HCL 5 MG/ML IJ SOLN
10.0000 mg | Freq: Once | INTRAMUSCULAR | Status: AC
  Administered 2020-05-10: 10 mg via INTRAVENOUS
  Filled 2020-05-10: qty 2

## 2020-05-10 MED ORDER — MORPHINE SULFATE (PF) 4 MG/ML IV SOLN
4.0000 mg | Freq: Once | INTRAVENOUS | Status: AC
  Administered 2020-05-10: 4 mg via INTRAVENOUS
  Filled 2020-05-10: qty 1

## 2020-05-10 MED ORDER — SODIUM CHLORIDE 0.9 % IV SOLN: INTRAVENOUS | Status: DC

## 2020-05-10 MED ORDER — HYDROMORPHONE HCL 1 MG/ML IJ SOLN
0.5000 mg | INTRAMUSCULAR | Status: DC | PRN
  Administered 2020-05-11: 0.5 mg via INTRAVENOUS
  Filled 2020-05-10: qty 0.5

## 2020-05-10 MED ORDER — POTASSIUM CHLORIDE CRYS ER 20 MEQ PO TBCR
40.0000 meq | EXTENDED_RELEASE_TABLET | Freq: Once | ORAL | Status: AC
  Administered 2020-05-10: 40 meq via ORAL
  Filled 2020-05-10: qty 2

## 2020-05-10 MED ORDER — ONDANSETRON HCL 4 MG/2ML IJ SOLN
4.0000 mg | Freq: Four times a day (QID) | INTRAMUSCULAR | Status: DC | PRN
  Administered 2020-05-10: 4 mg via INTRAVENOUS
  Filled 2020-05-10: qty 2

## 2020-05-10 MED ORDER — TIZANIDINE HCL 4 MG PO TABS
8.0000 mg | ORAL_TABLET | Freq: Three times a day (TID) | ORAL | Status: DC
  Administered 2020-05-10 – 2020-05-12 (×5): 8 mg via ORAL
  Filled 2020-05-10 (×5): qty 2

## 2020-05-10 MED ORDER — LORAZEPAM 2 MG/ML IJ SOLN: 1.0000 mg | Freq: Once | INTRAMUSCULAR | Status: DC

## 2020-05-10 MED ORDER — ONDANSETRON HCL 4 MG PO TABS: 4.0000 mg | ORAL_TABLET | Freq: Four times a day (QID) | ORAL | Status: DC | PRN

## 2020-05-10 MED ORDER — FLUOXETINE HCL 20 MG PO CAPS
40.0000 mg | ORAL_CAPSULE | Freq: Every day | ORAL | Status: DC
  Administered 2020-05-11 – 2020-05-12 (×2): 40 mg via ORAL
  Filled 2020-05-10 (×2): qty 2

## 2020-05-10 MED ORDER — LORAZEPAM 2 MG/ML IJ SOLN
1.0000 mg | Freq: Once | INTRAMUSCULAR | Status: AC
  Administered 2020-05-10: 1 mg via INTRAVENOUS
  Filled 2020-05-10: qty 1

## 2020-05-10 NOTE — ED Notes (Signed)
CT called to inform about IV placement

## 2020-05-10 NOTE — ED Provider Notes (Signed)
[facility] Provider Note   CSN: [id number] Arrival date & time: 05/10/20  0423     History Chief Complaint  Patient presents with  . Emesis    [name] is a 50 y.o. female presents with concern for nausea, vomiting, loose stools, and chills that started yesterday morning around 10 AM she has Not been vaccinated and has been around her ill adult son who has had fevers and chills.  She has not been tested for COVID-19.   She denies chest pain but endorses shortness of breath, myalgias.  Denies palpitations or abdominal pain.  Endorses NBNB emesis, "constantly since 1030 yesterday morning", 4 episodes of loose stools, denies melena or hematochezia.  Patient was administered ODT Zofran in route by EMS.  History provided by the patient.  She is vomiting at the time of my initial exam.   I personally reviewed this patient's medical records. She has history of PTSD, anxiety, depression, hypertension, hyperlipidemia, degenerative disc disease, chronic low back pain.   HPI     Past Medical History:  Diagnosis Date  . Anxiety   . Arthritis    "joints ache all over" (10/15/2014)  . Bulging lumbar disc   . Chronic lower back pain   . DDD (degenerative disc disease), cervical   . Depression   . Drug-seeking behavior   . Headache    "weekly" (10/15/2014)  . Hyperlipemia   . Hypertension   . PTSD (post-traumatic stress disorder)   . Skin cancer    "had them cut off my arms; don't know what kind"    Patient Active Problem List   Diagnosis Date Noted  . Fracture of ankle, bimalleolar, right, closed 03/28/2020  . Iron deficiency 05/05/2019  . Symptomatic anemia 05/05/2019  . Nausea & vomiting 12/26/2018  . Essential hypertension 12/26/2018  . ARF (acute renal failure) ([place]) 12/26/2018  . Intractable nausea and vomiting 11/28/2018  . Bipolar affective disorder ([place]) 11/28/2018  . Acute gastroenteritis 11/19/2015  . C. difficile  colitis   . SIRS (systemic inflammatory response syndrome) ([place]) 01/11/2015  . Cellulitis 01/03/2015  . Opiate withdrawal ([place]) 12/18/2014  . Herpes simplex virus type 1 (HSV-1) dermatitis   . Sacral fracture ([place]) 12/12/2014  . Leukocytosis 12/12/2014  . Chronic lower back pain 12/12/2014  . Sacral fracture, closed ([place]) 12/12/2014  . Nausea with vomiting   . Intractable abdominal pain 10/15/2014  . Abdominal pain 10/15/2014  . Hypokalemia 09/04/2014  . Sepsis ([place]) 09/01/2014  . Abdominal pain, generalized 09/01/2014  . PTSD (post-traumatic stress disorder) 09/01/2014  . Endometriosis 09/01/2014  . Sinus tachycardia 09/01/2014  . Lactic acidosis 09/01/2014  . Severe sepsis ([place]) 09/01/2014    Past Surgical History:  Procedure Laterality Date  . ABLATION ON ENDOMETRIOSIS  2008  . BIOPSY  12/27/2018   Procedure: BIOPSY;  Surgeon: Thornton Park, MD;  Location: [facility];  Service: Gastroenterology;;  . ESOPHAGOGASTRODUODENOSCOPY (EGD) WITH PROPOFOL N/A 12/27/2018   Procedure: ESOPHAGOGASTRODUODENOSCOPY (EGD) WITH PROPOFOL;  Surgeon: Thornton Park, MD;  Location: [facility];  Service: Gastroenterology;  Laterality: N/A;  . HEMORRHOID SURGERY  ~ 2002  . ORIF ANKLE FRACTURE Right 03/28/2020   Procedure: OPEN REDUCTION INTERNAL FIXATION (ORIF) RIGHT BIMALLEOLAR ANKLE FRACTURE;  Surgeon: Marchia Bond, MD;  Location: [facility];  Service: Orthopedics;  Laterality: Right;     OB History   No obstetric history on file.     Family History  Problem Relation Age of Onset  .  Breast cancer Mother   . Diabetes Mother   . Breast cancer Maternal Grandmother   . Breast cancer Paternal Grandmother   . Colon polyps Paternal Grandmother   . Colon cancer Neg Hx   . Esophageal cancer Neg Hx   . Rectal cancer Neg Hx   . Stomach cancer Neg Hx     Social History   Tobacco Use  . Smoking status: Never Smoker  . Smokeless tobacco: Never Used  Vaping Use  .  Vaping Use: Never used  Substance Use Topics  . Alcohol use: Not Currently    Comment: 10/15/2014 "I've drank before; nothing regular; don't drink now cause of RX I'm on"  . Drug use: No    Home Medications Prior to Admission medications   Medication Sig Start Date End Date Taking? Authorizing Provider  FLUoxetine (PROZAC) 40 MG capsule Take 40 mg by mouth daily.  06/26/17  Yes [provider]  hydrochlorothiazide (HYDRODIURIL) 25 MG tablet Take 25 mg by mouth at bedtime. 06/26/17  Yes [provider]  lamoTRIgine (LAMICTAL) 200 MG tablet Take 200 mg by mouth 2 (two) times daily. 04/29/20  Yes [provider]  lisinopril (ZESTRIL) 10 MG tablet Take 10 mg by mouth daily.    Yes [provider]  risperiDONE (RISPERDAL) 0.5 MG tablet Take 0.5 mg by mouth 2 (two) times daily. 03/03/18  Yes [provider]  tiZANidine (ZANAFLEX) 4 MG tablet Take 8 mg by mouth 3 (three) times daily.   Yes [provider]  traZODone (DESYREL) 150 MG tablet Take 150 mg by mouth at bedtime.  06/03/17  Yes [provider]  acetaminophen (TYLENOL) 325 MG tablet Take 2 tablets (650 mg total) by mouth every 6 (six) hours as needed for mild pain (or Fever >/= 101). Patient not taking: Reported on 05/10/2020 12/28/18   Oswald Hillock, MD  amitriptyline (ELAVIL) 50 MG tablet TAKE 1 TABLET BY MOUTH AT BEDTIME Patient not taking: Reported on 05/10/2020 07/23/19   Thornton Park, MD  methocarbamol (ROBAXIN) 500 MG tablet Take 1 tablet (500 mg total) by mouth 2 (two) times daily. Patient not taking: Reported on 05/10/2020 03/21/20   Lorayne Bender, PA-C  omeprazole (PRILOSEC) 20 MG capsule Take 20 mg by mouth daily. Patient not taking: Reported on 05/10/2020    [provider]  ondansetron (ZOFRAN) 4 MG tablet Take 1 tablet (4 mg total) by mouth every 6 (six) hours as needed for nausea. Patient not taking: Reported on 05/10/2020 12/28/18   Oswald Hillock, MD  ondansetron  (ZOFRAN) 4 MG tablet Take 1 tablet (4 mg total) by mouth every 8 (eight) hours as needed for nausea or vomiting. Patient not taking: Reported on 05/10/2020 03/28/20   Ventura Bruns, PA-C  oxyCODONE (ROXICODONE) 5 MG immediate release tablet Take 1 tablet (5 mg total) by mouth every 4 (four) hours as needed for severe pain. Patient not taking: Reported on 05/10/2020 03/28/20   Ventura Bruns, PA-C  oxyCODONE-acetaminophen (PERCOCET/ROXICET) 5-325 MG tablet Take 1-2 tablets by mouth every 6 (six) hours as needed for severe pain. Patient not taking: Reported on 05/10/2020 03/21/20   Lorayne Bender, PA-C    Allergies    Lactose intolerance (gi)  Review of Systems   Review of Systems  Constitutional: Positive for activity change, appetite change, chills, fatigue and fever.  HENT: Negative.   Eyes: Negative.   Respiratory: Positive for shortness of breath. Negative for cough and chest tightness.  Cardiovascular: Negative.   Gastrointestinal: Positive for diarrhea, nausea and vomiting. Negative for abdominal pain and blood in stool.  Genitourinary: Negative.   Musculoskeletal: Positive for myalgias.  Skin: Negative.   Neurological: Positive for headaches. Negative for dizziness, syncope, facial asymmetry, weakness and light-headedness.  Hematological: Negative.   Psychiatric/Behavioral: The patient is nervous/anxious.     Physical Exam Updated Vital Signs BP (!) 160/116   Pulse 79   Temp 98.4 F (36.9 C) (Oral)   Resp 19   LMP 06/03/2018 (Approximate) Comment: neg hcg 05/10/20  SpO2 94%   Physical Exam Vitals and nursing note reviewed.  Constitutional:      Appearance: She is obese.  HENT:     Head: Normocephalic and atraumatic.     Nose: Nose normal.     Mouth/Throat:     Mouth: Mucous membranes are moist.     Pharynx: Uvula midline. Posterior oropharyngeal erythema present. No oropharyngeal exudate or uvula swelling.     Tonsils: No tonsillar exudate.  Eyes:     General: Lids  are normal. Vision grossly intact.        Right eye: No discharge.        Left eye: No discharge.     Extraocular Movements: Extraocular movements intact.     Conjunctiva/sclera: Conjunctivae normal.     Pupils: Pupils are equal, round, and reactive to light.  Neck:     Trachea: Trachea and phonation normal.  Cardiovascular:     Rate and Rhythm: Normal rate and regular rhythm.     Pulses: Normal pulses.          Radial pulses are 2+ on the right side and 2+ on the left side.       Dorsalis pedis pulses are 2+ on the left side.     Heart sounds: Normal heart sounds. No murmur heard.     Comments: Unable to assess DP pulse in the right foot as patient is in a short leg cast secondary to recent Lower leg fx. Normal cap refill in the right foot.  Pulmonary:     Effort: Pulmonary effort is normal. No respiratory distress.     Breath sounds: Normal breath sounds. No wheezing or rales.  Chest:     Chest wall: No deformity, swelling, tenderness, crepitus or edema.  Abdominal:     General: Bowel sounds are normal. There is no distension.     Palpations: Abdomen is soft.     Tenderness: There is generalized abdominal tenderness and tenderness in the periumbilical area and left lower quadrant. There is no right CVA tenderness, guarding or rebound.     Comments: Pt vomiting/ dry heaving throughout exam  Musculoskeletal:        General: No deformity.     Cervical back: Neck supple. No rigidity, tenderness or crepitus. No pain with movement, spinous process tenderness or muscular tenderness.     Right lower leg: No edema.     Left lower leg: No edema.     Comments: RLE cast  Lymphadenopathy:     Cervical: No cervical adenopathy.  Skin:    General: Skin is warm and dry.     Capillary Refill: Capillary refill takes less than 2 seconds.  Neurological:     General: No focal deficit present.     Mental Status: She is alert and oriented to person, place, and time.     Sensory: Sensation is  intact.     Motor: Motor function is intact.  Psychiatric:  Mood and Affect: Mood is anxious. Affect is tearful.     ED Results / Procedures / Treatments   Labs (all labs ordered are listed, but only abnormal results are displayed) Labs Reviewed  RESP PANEL BY RT-PCR (FLU A&B, COVID) ARPGX2 - Abnormal; Notable for the following components:      Result Value   SARS Coronavirus 2 by RT PCR POSITIVE (*)    All other components within normal limits  COMPREHENSIVE METABOLIC PANEL - Abnormal; Notable for the following components:   Potassium 3.1 (*)    Chloride 93 (*)    CO2 17 (*)    Glucose, Bld 199 (*)    Creatinine, Ser 1.73 (*)    Calcium 10.4 (*)    Total Protein 9.0 (*)    GFR, Estimated 36 (*)    Anion gap 27 (*)    All other components within normal limits  CBC - Abnormal; Notable for the following components:   WBC 18.9 (*)    RBC 5.16 (*)    Hemoglobin 16.6 (*)    HCT 46.9 (*)    Platelets 445 (*)    All other components within normal limits  CBC - Abnormal; Notable for the following components:   WBC 16.6 (*)    All other components within normal limits  I-STAT VENOUS BLOOD GAS, ED - Abnormal; Notable for the following components:   pH, Ven 7.542 (*)    pCO2, Ven 30.7 (*)    pO2, Ven 129.0 (*)    Acid-Base Excess 4.0 (*)    Potassium 3.0 (*)    Calcium, Ion 1.05 (*)    All other components within normal limits  CULTURE, BLOOD (ROUTINE X 2)  CULTURE, BLOOD (ROUTINE X 2)  LIPASE, BLOOD  MAGNESIUM  URINALYSIS, ROUTINE W REFLEX MICROSCOPIC  BLOOD GAS, VENOUS  COMPREHENSIVE METABOLIC PANEL  LACTIC ACID, PLASMA  LACTIC ACID, PLASMA  D-DIMER, QUANTITATIVE (NOT AT [facility])  PROCALCITONIN  LACTATE DEHYDROGENASE  FERRITIN  TRIGLYCERIDES  FIBRINOGEN  C-REACTIVE PROTEIN  I-STAT BETA HCG BLOOD, ED ([facility], [facility], [facility] ONLY)  CBG MONITORING, ED    EKG EKG Interpretation  Date/Time:  Tuesday May 10 2020 10:25:47 EST Ventricular Rate:  88 PR Interval:    QRS  Duration: 100 QT Interval:  391 QTC Calculation: 474 R Axis:   7 Text Interpretation: Sinus rhythm RSR' in V1 or V2, right VCD or RVH NSR, no STEMI Confirmed by Lavenia Atlas [phone]) on 05/10/2020 1:12:01 PM   Radiology CT Abdomen Pelvis W Contrast  Result Date: 05/10/2020 CLINICAL DATA:  Left lower quadrant pain. Nausea and vomiting. Diarrhea. Chills. COVID-19 virus infection. EXAM: CT ABDOMEN AND PELVIS WITH CONTRAST TECHNIQUE: Multidetector CT imaging of the abdomen and pelvis was performed using the standard protocol following bolus administration of intravenous contrast. CONTRAST:  77mL OMNIPAQUE IOHEXOL 300 MG/ML  SOLN COMPARISON:  12/26/2018 FINDINGS: Lower Chest: No acute findings. Hepatobiliary: No hepatic masses identified. Mild diffuse hepatic steatosis noted. Gallbladder is unremarkable. No evidence of biliary ductal dilatation. Pancreas:  No mass or inflammatory changes. Spleen: Within normal limits in size and appearance. Adrenals/Urinary Tract: No masses identified. No evidence of ureteral calculi or hydronephrosis. Stomach/Bowel: Stable small left diaphragmatic Bochdalek hernia containing the gastric fundus. No evidence of obstruction, inflammatory process or abnormal fluid collections. Normal appendix visualized. Vascular/Lymphatic: No pathologically enlarged lymph nodes. No abdominal aortic aneurysm. Reproductive:  No mass or other significant abnormality. Other:  None. Musculoskeletal:  No suspicious bone lesions identified. IMPRESSION: No acute findings  within the abdomen or pelvis. Mild hepatic steatosis. Stable small left diaphragmatic Bochdalek hernia containing gastric fundus. Electronically Signed   By: Marlaine Hind M.D.   On: 05/10/2020 14:36   DG Chest Portable 1 View  Result Date: 05/10/2020 CLINICAL DATA:  Nausea, vomiting and diarrhea. EXAM: PORTABLE CHEST 1 VIEW COMPARISON:  11/28/2018 FINDINGS: The heart size and mediastinal contours are within normal limits. Both lungs  are clear. Scar noted within the right base. The visualized skeletal structures are unremarkable. IMPRESSION: No active disease. Electronically Signed   By: Kerby Moors M.D.   On: 05/10/2020 10:40    Procedures Procedures (including critical care time)  Medications Ordered in ED Medications  potassium chloride 10 mEq in 100 mL IVPB (0 mEq Intravenous Stopped 05/10/20 1553)  metoCLOPramide (REGLAN) injection 10 mg (has no administration in time range)  sodium chloride 0.9 % bolus 1,000 mL (0 mLs Intravenous Stopped 05/10/20 1242)  ondansetron (ZOFRAN) injection 4 mg (4 mg Intravenous Given 05/10/20 1004)  morphine 4 MG/ML injection 4 mg (4 mg Intravenous Given 05/10/20 1005)  metoCLOPramide (REGLAN) injection 10 mg (10 mg Intravenous Given 05/10/20 1238)  fentaNYL (SUBLIMAZE) injection 50 mcg (50 mcg Intravenous Given 05/10/20 1238)  sodium chloride 0.9 % bolus 1,000 mL (0 mLs Intravenous Stopped 05/10/20 1803)  iohexol (OMNIPAQUE) 300 MG/ML solution 100 mL (80 mLs Intravenous Contrast Given 05/10/20 1419)  LORazepam (ATIVAN) injection 1 mg (1 mg Intravenous Given 05/10/20 1721)  ketorolac (TORADOL) 30 MG/ML injection 30 mg (30 mg Intravenous Given 05/10/20 1722)  metoprolol tartrate (LOPRESSOR) injection 5 mg (5 mg Intravenous Given 05/10/20 1722)    ED Course  I have reviewed the triage vital signs and the nursing notes.  Pertinent labs & imaging results that were available during my care of the patient were reviewed by me and considered in my medical decision making (see chart for details).    MDM Rules/Calculators/A&P                          50 year old female who presents with concern for 1 day of nausea, vomiting, chills, body aches.  She is unvaccinated against COVID-19.  Patient was mildly tachycardic to 102 on intake, hypertensive to 160/133 on intake, vital signs otherwise normal.  At the time of my initial exam the patient is vomiting, and is tremulous secondary to nausea.  I was  unable to perform a thorough physical exam due to patient's current state.  Will administer nausea and pain medication and return to perform physical exam once patient is more comfortable.  Physical exam performed  - there is significant abdominal tenderness to palpation particularly in the left lower quadrant.  Cardiopulmonary exam is normal.  Patient continues to dry heave and be tremulous in the room secondary to her nausea.  Will proceed with CT of the abdomen pelvis given exquisite abdominal tenderness to palpation. Additionally patient requesting more nausea medication and pain medication.  Analgesia and antiemetic ordered.  Patient has tested positive at this time for COVID-19.  EKG sinus rhythm. Laboratory studies are as follows, CBC with leukocytosis of 18.9.  CMP with hypokalemia to 3.1, AKI with creatinine 1.73, previously 0.82.  Additionally anion gap elevated to 27.  VBG obtained after significant delay due to nursing staff shortage. Venous pH is alkalotic 7.54, PCO2 is decreased to 30.7, acid base deficit 4.  We will proceed with further IV resuscitation as well as IV potassium repletion.  Chest x-ray negative for  acute cardiopulmonary disease.  CT of abdomen pelvis negative for acute abnormality.  Patient reevaluated: continues to have severe nausea with dry heaving, though mildly improved.  Additionally she remains hypertensive, despite mild improvement in her nausea.  Antiemetic, antihypertensive medications ordered.  UA pending, as patient has been unable to give urine sample thus far.  She is on a Purewick at this time.  Will await urine.  At time of my reevaluation Arron remains severely nauseous. Given inability to control nausea in the emergency department, patient who is not tolerating p.o., and electrolyte derangements on laboratory studies, feel this patient would benefit from admission to the hospital at this time.  Consult placed to hospitalist, Dr. Roosevelt Locks, who is agreeable to  seeing this patient in the emergency department and admitting her to his service.  Appreciate his collaboration in the care of this patient.  COVID pre-admission order set placed.  [name] was evaluated in Emergency Department on 05/10/2020 for the symptoms described in the history of present illness. She was evaluated in the context of the global COVID-19 pandemic, which necessitated consideration that the patient might be at risk for infection with the SARS-CoV-2 virus that causes COVID-19. Institutional protocols and algorithms that pertain to the evaluation of patients at risk for COVID-19 are in a state of rapid change based on information released by regulatory bodies including the CDC and federal and state organizations. These policies and algorithms were followed during the patient's care in the ED.  This chart was dictated using voice recognition software, Dragon. Despite the best efforts of this provider to proofread and correct errors, errors may still occur which can change documentation meaning.  Final Clinical Impression(s) / ED Diagnoses Final diagnoses:  None    Rx / DC Orders ED Discharge Orders    None       Emeline Darling, PA-C 05/10/20 1808    Lorelle Gibbs, DO 05/11/20 [phone]

## 2020-05-10 NOTE — H&P (Addendum)
History and Physical    [name] QQV:[id number] DOB: 02-05-1971 DOA: 05/10/2020  PCP: Andreas Blower, MD (Confirm with patient/family/NH records and if not entered, this has to be entered at [facility] point of entry) Patient coming from: Home  I have personally briefly reviewed patient's old medical records in [place]  Chief Complaint: Nausea with vomiting and diarrhea  HPI: [name] is a 50 y.o. female with medical history significant of HTN, anxiety/depression, endometriosis, presented with new onset of right lower quadrant abdominal pain, and persistent nausea vomit.  Symptoms started yesterday morning, started to feel nausea than 3-4 bouts of vomiting of stomach content, no coffee-ground stuff no bile. Then started to feel cramping-like RLQ abdominal pain, constant, localized, and the patient passed 2 loose bowel movement last night then some Tenesmus, but no more diarrhea thinks. Continue to feel nausea since yesterday, denies any cough no shortness of breath, feeling episode chills but no fever.  She was not acting negative for COVID.  ED Course: CT abdomen with contrast did not show clear infection source. UA pending. WBC 18.9, ABG seven-point 5/30/129. COVID test came positive. Chest x-ray clear.  Review of Systems: As per HPI otherwise 14 point review of systems negative.    Past Medical History:  Diagnosis Date  . Anxiety   . Arthritis    "joints ache all over" (10/15/2014)  . Bulging lumbar disc   . Chronic lower back pain   . DDD (degenerative disc disease), cervical   . Depression   . Drug-seeking behavior   . Headache    "weekly" (10/15/2014)  . Hyperlipemia   . Hypertension   . PTSD (post-traumatic stress disorder)   . Skin cancer    "had them cut off my arms; don't know what kind"    Past Surgical History:  Procedure Laterality Date  . ABLATION ON ENDOMETRIOSIS  2008  . BIOPSY  12/27/2018   Procedure: BIOPSY;  Surgeon: Thornton Park, MD;  Location: [facility];  Service: Gastroenterology;;  . ESOPHAGOGASTRODUODENOSCOPY (EGD) WITH PROPOFOL N/A 12/27/2018   Procedure: ESOPHAGOGASTRODUODENOSCOPY (EGD) WITH PROPOFOL;  Surgeon: Thornton Park, MD;  Location: [facility];  Service: Gastroenterology;  Laterality: N/A;  . HEMORRHOID SURGERY  ~ 2002  . ORIF ANKLE FRACTURE Right 03/28/2020   Procedure: OPEN REDUCTION INTERNAL FIXATION (ORIF) RIGHT BIMALLEOLAR ANKLE FRACTURE;  Surgeon: Marchia Bond, MD;  Location: [facility];  Service: Orthopedics;  Laterality: Right;     reports that she has never smoked. She has never used smokeless tobacco. She reports previous alcohol use. She reports that she does not use drugs.  Allergies  Allergen Reactions  . Lactose Intolerance (Gi) Nausea Only    Family History  Problem Relation Age of Onset  . Breast cancer Mother   . Diabetes Mother   . Breast cancer Maternal Grandmother   . Breast cancer Paternal Grandmother   . Colon polyps Paternal Grandmother   . Colon cancer Neg Hx   . Esophageal cancer Neg Hx   . Rectal cancer Neg Hx   . Stomach cancer Neg Hx      Prior to Admission medications   Medication Sig Start Date End Date Taking? Authorizing Provider  FLUoxetine (PROZAC) 40 MG capsule Take 40 mg by mouth daily.  06/26/17  Yes [provider]  hydrochlorothiazide (HYDRODIURIL) 25 MG tablet Take 25 mg by mouth at bedtime. 06/26/17  Yes [provider]  lamoTRIgine (LAMICTAL) 200 MG tablet Take 200 mg by mouth  2 (two) times daily. 04/29/20  Yes [provider]  lisinopril (ZESTRIL) 10 MG tablet Take 10 mg by mouth daily.    Yes [provider]  risperiDONE (RISPERDAL) 0.5 MG tablet Take 0.5 mg by mouth 2 (two) times daily. 03/03/18  Yes [provider]  tiZANidine (ZANAFLEX) 4 MG tablet Take 8 mg by mouth 3 (three) times daily.   Yes [provider]  traZODone (DESYREL) 150 MG tablet Take 150 mg by mouth  at bedtime.  06/03/17  Yes [provider]  acetaminophen (TYLENOL) 325 MG tablet Take 2 tablets (650 mg total) by mouth every 6 (six) hours as needed for mild pain (or Fever >/= 101). Patient not taking: Reported on 05/10/2020 12/28/18   Oswald Hillock, MD  amitriptyline (ELAVIL) 50 MG tablet TAKE 1 TABLET BY MOUTH AT BEDTIME Patient not taking: Reported on 05/10/2020 07/23/19   Thornton Park, MD  methocarbamol (ROBAXIN) 500 MG tablet Take 1 tablet (500 mg total) by mouth 2 (two) times daily. Patient not taking: Reported on 05/10/2020 03/21/20   Lorayne Bender, PA-C  omeprazole (PRILOSEC) 20 MG capsule Take 20 mg by mouth daily. Patient not taking: Reported on 05/10/2020    [provider]  ondansetron (ZOFRAN) 4 MG tablet Take 1 tablet (4 mg total) by mouth every 6 (six) hours as needed for nausea. Patient not taking: Reported on 05/10/2020 12/28/18   Oswald Hillock, MD  ondansetron (ZOFRAN) 4 MG tablet Take 1 tablet (4 mg total) by mouth every 8 (eight) hours as needed for nausea or vomiting. Patient not taking: Reported on 05/10/2020 03/28/20   Ventura Bruns, PA-C  oxyCODONE (ROXICODONE) 5 MG immediate release tablet Take 1 tablet (5 mg total) by mouth every 4 (four) hours as needed for severe pain. Patient not taking: Reported on 05/10/2020 03/28/20   Ventura Bruns, PA-C  oxyCODONE-acetaminophen (PERCOCET/ROXICET) 5-325 MG tablet Take 1-2 tablets by mouth every 6 (six) hours as needed for severe pain. Patient not taking: Reported on 05/10/2020 03/21/20   Lorayne Bender, PA-C    Physical Exam: Vitals:   05/10/20 1400 05/10/20 1600 05/10/20 1700 05/10/20 1800  BP: (!) 154/109 (!) 169/159 (!) 164/112 (!) 160/116  Pulse: 99 93 87 79  Resp: 17 (!) 22 15 19   Temp:      TempSrc:      SpO2: 94% 94% 95% 94%    Constitutional: NAD, calm, comfortable Vitals:   05/10/20 1400 05/10/20 1600 05/10/20 1700 05/10/20 1800  BP: (!) 154/109 (!) 169/159 (!) 164/112 (!) 160/116  Pulse: 99 93 87  79  Resp: 17 (!) 22 15 19   Temp:      TempSrc:      SpO2: 94% 94% 95% 94%   Eyes: PERRL, lids and conjunctivae normal ENMT: Mucous membranes are dry. Posterior pharynx clear of any exudate or lesions.Normal dentition.  Neck: normal, supple, no masses, no thyromegaly Respiratory: clear to auscultation bilaterally, no wheezing, no crackles. Normal respiratory effort. No accessory muscle use.  Cardiovascular: Regular rate and rhythm, no murmurs / rubs / gallops. No extremity edema. 2+ pedal pulses. No carotid bruits.  Abdomen: RLQ tenderness near McBurney's site, no rebound no guarding, no masses palpated. No hepatosplenomegaly. Bowel sounds positive.  Musculoskeletal: no clubbing / cyanosis. No joint deformity upper and lower extremities. Good ROM, no contractures. Normal muscle tone.  Skin: no rashes, lesions, ulcers. No induration Neurologic: CN 2-12 grossly intact. Sensation intact, DTR normal. Strength 5/5 in all 4.  Psychiatric: Normal judgment and insight. Alert and oriented x 3. Normal mood.    Labs on Admission: I have personally reviewed following labs and imaging studies  CBC: Recent Labs  Lab 05/10/20 0434 05/10/20 1700 05/10/20 1708  WBC 18.9* 16.6*  --   HGB 16.6* 14.7 14.6  HCT 46.9* 41.5 43.0  MCV 90.9 90.8  --   PLT 445* 376  --    Basic Metabolic Panel: Recent Labs  Lab 05/10/20 0434 05/10/20 1700 05/10/20 1708  NA 137 141 141  K 3.1* 2.9* 3.0*  CL 93* 99  --   CO2 17* 23  --   GLUCOSE 199* 145*  --   BUN 12 13  --   CREATININE 1.73* 1.67*  --   CALCIUM 10.4* 9.3  --   MG 1.9  --   --    GFR: CrCl cannot be calculated (Unknown ideal weight.). Liver Function Tests: Recent Labs  Lab 05/10/20 0434 05/10/20 1700  AST 34 31  ALT 26 22  ALKPHOS 95 78  BILITOT 0.8 0.8  PROT 9.0* 7.7  ALBUMIN 4.6 4.1   Recent Labs  Lab 05/10/20 0434  LIPASE 21   No results for input(s): AMMONIA in the last 168 hours. Coagulation Profile: No results for  input(s): INR, PROTIME in the last 168 hours. Cardiac Enzymes: No results for input(s): CKTOTAL, CKMB, CKMBINDEX, TROPONINI in the last 168 hours. BNP (last 3 results) No results for input(s): PROBNP in the last 8760 hours. HbA1C: No results for input(s): HGBA1C in the last 72 hours. CBG: No results for input(s): GLUCAP in the last 168 hours. Lipid Profile: No results for input(s): CHOL, HDL, LDLCALC, TRIG, CHOLHDL, LDLDIRECT in the last 72 hours. Thyroid Function Tests: No results for input(s): TSH, T4TOTAL, FREET4, T3FREE, THYROIDAB in the last 72 hours. Anemia Panel: No results for input(s): VITAMINB12, FOLATE, FERRITIN, TIBC, IRON, RETICCTPCT in the last 72 hours. Urine analysis:    Component Value Date/Time   COLORURINE YELLOW 05/10/2020 0426   APPEARANCEUR HAZY (A) 05/10/2020 0426   LABSPEC >1.046 (H) 05/10/2020 0426   PHURINE 5.0 05/10/2020 0426   GLUCOSEU NEGATIVE 05/10/2020 0426   HGBUR SMALL (A) 05/10/2020 0426   BILIRUBINUR NEGATIVE 05/10/2020 0426   KETONESUR 20 (A) 05/10/2020 0426   PROTEINUR 30 (A) 05/10/2020 0426   UROBILINOGEN 0.2 01/19/2015 0302   NITRITE NEGATIVE 05/10/2020 0426   LEUKOCYTESUR TRACE (A) 05/10/2020 0426    Radiological Exams on Admission: CT Abdomen Pelvis W Contrast  Result Date: 05/10/2020 CLINICAL DATA:  Left lower quadrant pain. Nausea and vomiting. Diarrhea. Chills. COVID-19 virus infection. EXAM: CT ABDOMEN AND PELVIS WITH CONTRAST TECHNIQUE: Multidetector CT imaging of the abdomen and pelvis was performed using the standard protocol following bolus administration of intravenous contrast. CONTRAST:  12mL OMNIPAQUE IOHEXOL 300 MG/ML  SOLN COMPARISON:  12/26/2018 FINDINGS: Lower Chest: No acute findings. Hepatobiliary: No hepatic masses identified. Mild diffuse hepatic steatosis noted. Gallbladder is unremarkable. No evidence of biliary ductal dilatation. Pancreas:  No mass or inflammatory changes. Spleen: Within normal limits in size and  appearance. Adrenals/Urinary Tract: No masses identified. No evidence of ureteral calculi or hydronephrosis. Stomach/Bowel: Stable small left diaphragmatic Bochdalek hernia containing the gastric fundus. No evidence of obstruction, inflammatory process or abnormal fluid collections. Normal appendix visualized. Vascular/Lymphatic: No pathologically enlarged lymph nodes. No abdominal aortic aneurysm. Reproductive:  No mass or other significant abnormality. Other:  None. Musculoskeletal:  No suspicious bone lesions identified. IMPRESSION: No acute findings within the abdomen or  pelvis. Mild hepatic steatosis. Stable small left diaphragmatic Bochdalek hernia containing gastric fundus. Electronically Signed   By: Marlaine Hind M.D.   On: 05/10/2020 14:36   DG Chest Portable 1 View  Result Date: 05/10/2020 CLINICAL DATA:  Nausea, vomiting and diarrhea. EXAM: PORTABLE CHEST 1 VIEW COMPARISON:  11/28/2018 FINDINGS: The heart size and mediastinal contours are within normal limits. Both lungs are clear. Scar noted within the right base. The visualized skeletal structures are unremarkable. IMPRESSION: No active disease. Electronically Signed   By: Kerby Moors M.D.   On: 05/10/2020 10:40    EKG: Independently reviewed., No acute ST changes  Assessment/Plan Active Problems:   Abdominal pain, generalized   AKI (acute kidney injury) ([place])  (please populate well all problems here in Problem List. (For example, if patient is on BP meds at home and you resume or decide to hold them, it is a problem that needs to be her. Same for CAD, COPD, HLD and so on)  Acute gastroenteritis -Discussed with on-call radiologist and reviewed CT with contrast this afternoon, again confirmed there is no acute findings suggest any infection process the appendix, renal ureter system, and no diverticulitis. -UA showed no Nitrite, but trace leukocyte, WBC 6-10 which appears to be her baseline in previous UAs. -Monitor off ABX for  now.  Leukocytosis -Monitor off antibiotics  COVID-19 infection -No change or signs of viral pneumonia -Chest x-ray and CT abdomen (imaging including lower heart of the lung) showed no infiltrates -Markers sent, will follow  AKI -Secondary dehydration -Start IV fluid  HTN -PRN Labetelol.  Anxiety depression -Continue home regimen   DVT prophylaxis: Heparin subcu Code Status: Full Code Family Communication: None at bedside Disposition Plan: Expect 1 to 2 days hospital stay for AKI and gastroenteritis. Consults called: None Admission status: Tele admit   Lequita Halt MD [facility] Pager [phone]  05/10/2020, 6:55 PM

## 2020-05-10 NOTE — ED Notes (Signed)
Received report from [employer].

## 2020-05-10 NOTE — ED Triage Notes (Signed)
Pt comes via [facility] for n/v/d, chills that started last night. PTA received ODT zofran, unvaccinated

## 2020-05-10 NOTE — ED Notes (Signed)
Taken to CT.

## 2020-05-10 NOTE — ED Notes (Signed)
Unable to obtain access for CT  Consult placed for IV team

## 2020-05-11 ENCOUNTER — Inpatient Hospital Stay (HOSPITAL_COMMUNITY): Payer: Medicare Other

## 2020-05-11 DIAGNOSIS — U071 COVID-19: Principal | ICD-10-CM

## 2020-05-11 DIAGNOSIS — K5904 Chronic idiopathic constipation: Secondary | ICD-10-CM

## 2020-05-11 LAB — CBC WITH DIFFERENTIAL/PLATELET
Abs Immature Granulocytes: 0.09 10*3/uL — ABNORMAL HIGH (ref 0.00–0.07)
Basophils Absolute: 0 10*3/uL (ref 0.0–0.1)
Basophils Relative: 0 %
Eosinophils Absolute: 0 10*3/uL (ref 0.0–0.5)
Eosinophils Relative: 0 %
HCT: 36 % (ref 36.0–46.0)
Hemoglobin: 12.5 g/dL (ref 12.0–15.0)
Immature Granulocytes: 1 %
Lymphocytes Relative: 10 %
Lymphs Abs: 1.1 10*3/uL (ref 0.7–4.0)
MCH: 32.2 pg (ref 26.0–34.0)
MCHC: 34.7 g/dL (ref 30.0–36.0)
MCV: 92.8 fL (ref 80.0–100.0)
Monocytes Absolute: 1 10*3/uL (ref 0.1–1.0)
Monocytes Relative: 9 %
Neutro Abs: 8.9 10*3/uL — ABNORMAL HIGH (ref 1.7–7.7)
Neutrophils Relative %: 80 %
Platelets: 280 10*3/uL (ref 150–400)
RBC: 3.88 MIL/uL (ref 3.87–5.11)
RDW: 12.6 % (ref 11.5–15.5)
WBC: 11.1 10*3/uL — ABNORMAL HIGH (ref 4.0–10.5)
nRBC: 0 % (ref 0.0–0.2)

## 2020-05-11 LAB — TROPONIN I (HIGH SENSITIVITY): Troponin I (High Sensitivity): 32 ng/L — ABNORMAL HIGH (ref <18)

## 2020-05-11 LAB — C-REACTIVE PROTEIN: CRP: 5.9 mg/dL — ABNORMAL HIGH (ref <1.0)

## 2020-05-11 LAB — COMPREHENSIVE METABOLIC PANEL
ALT: 20 U/L (ref 0–44)
AST: 26 U/L (ref 15–41)
Albumin: 3.5 g/dL (ref 3.5–5.0)
Alkaline Phosphatase: 62 U/L (ref 38–126)
Anion gap: 12 (ref 5–15)
BUN: 12 mg/dL (ref 6–20)
CO2: 24 mmol/L (ref 22–32)
Calcium: 8.3 mg/dL — ABNORMAL LOW (ref 8.9–10.3)
Chloride: 105 mmol/L (ref 98–111)
Creatinine, Ser: 1.31 mg/dL — ABNORMAL HIGH (ref 0.44–1.00)
GFR, Estimated: 50 mL/min — ABNORMAL LOW (ref >60)
Glucose, Bld: 132 mg/dL — ABNORMAL HIGH (ref 70–99)
Potassium: 3.5 mmol/L (ref 3.5–5.1)
Sodium: 141 mmol/L (ref 135–145)
Total Bilirubin: 0.8 mg/dL (ref 0.3–1.2)
Total Protein: 6.8 g/dL (ref 6.5–8.1)

## 2020-05-11 LAB — MAGNESIUM: Magnesium: 2 mg/dL (ref 1.7–2.4)

## 2020-05-11 LAB — BLOOD GAS, VENOUS
Acid-Base Excess: 1 mmol/L (ref 0.0–2.0)
Bicarbonate: 25 mmol/L (ref 20.0–28.0)
Drawn by: 2586
O2 Saturation: 91.2 %
Patient temperature: 37
pCO2, Ven: 38.7 mmHg — ABNORMAL LOW (ref 44.0–60.0)
pH, Ven: 7.425 (ref 7.250–7.430)
pO2, Ven: 60.1 mmHg — ABNORMAL HIGH (ref 32.0–45.0)

## 2020-05-11 LAB — FERRITIN: Ferritin: 77 ng/mL (ref 11–307)

## 2020-05-11 LAB — MRSA PCR SCREENING: MRSA by PCR: NEGATIVE

## 2020-05-11 LAB — FIBRINOGEN: Fibrinogen: 394 mg/dL (ref 210–475)

## 2020-05-11 LAB — PROCALCITONIN: Procalcitonin: 0.11 ng/mL

## 2020-05-11 LAB — BRAIN NATRIURETIC PEPTIDE: B Natriuretic Peptide: 190 pg/mL — ABNORMAL HIGH (ref 0.0–100.0)

## 2020-05-11 LAB — PHOSPHORUS: Phosphorus: 3.6 mg/dL (ref 2.5–4.6)

## 2020-05-11 LAB — D-DIMER, QUANTITATIVE: D-Dimer, Quant: 0.83 ug/mL-FEU — ABNORMAL HIGH (ref 0.00–0.50)

## 2020-05-11 MED ORDER — WHITE PETROLATUM EX OINT
TOPICAL_OINTMENT | CUTANEOUS | Status: AC
  Filled 2020-05-11: qty 28.35

## 2020-05-11 MED ORDER — POTASSIUM CHLORIDE CRYS ER 20 MEQ PO TBCR
40.0000 meq | EXTENDED_RELEASE_TABLET | Freq: Once | ORAL | Status: AC
  Administered 2020-05-11: 40 meq via ORAL
  Filled 2020-05-11: qty 2

## 2020-05-11 MED ORDER — SODIUM CHLORIDE 0.9 % IV SOLN: INTRAVENOUS | Status: AC

## 2020-05-11 MED ORDER — OXYCODONE HCL 5 MG PO TABS
5.0000 mg | ORAL_TABLET | Freq: Four times a day (QID) | ORAL | Status: DC | PRN
  Administered 2020-05-11 – 2020-05-12 (×3): 5 mg via ORAL
  Filled 2020-05-11 (×3): qty 1

## 2020-05-11 NOTE — Progress Notes (Signed)
PROGRESS NOTE                                                                                                                                                                                                             Patient Demographics:    Rosell Khouri, is a 50 y.o. female, DOB - 09/08/1970, CHE:[id number]  Outpatient Primary MD for the patient is Debria Garret, Norris Cross, MD    LOS - 1  Admit date - 05/10/2020    Chief Complaint  Patient presents with  . Emesis       Brief Narrative (HPI from H&P)   [name] is a 50 y.o. female with medical history significant of HTN, anxiety/depression, endometriosis, presented with new onset of right lower quadrant abdominal pain, and persistent nausea vomit, was also found to have incidental COVID infection in the ER and admitted to the hospital for further care.   Subjective:    Aggie Hacker today has, No headache, No chest pain, mild right lower quadrant abdominal discomfort which has improved, is passing flatus, no shortness of breath.  No nausea vomiting.   Assessment  & Plan :     1. Acute COVID-19 infection - she is not vaccinated and so far seems to have mild COVID-19 infection, no pulmonary symptoms.  Will monitor.  Encouraged the patient to sit up in chair in the daytime use I-S and flutter valve for pulmonary toiletry and then prone in bed when at night.  Will advance activity and titrate down oxygen as possible.   SpO2: 97 % O2 Flow Rate (L/min): 2 L/min  Recent Labs  Lab 05/10/20 0427 05/10/20 0434 05/10/20 1700 05/10/20 1708 05/10/20 1742 05/10/20 2000 05/11/20 0114  WBC  --  18.9* 16.6*  --   --   --  11.1*  HGB  --  16.6* 14.7 14.6  --   --  12.5  HCT  --  46.9* 41.5 43.0  --   --  36.0  PLT  --  445* 376  --   --   --  280  CRP  --   --   --   --   --  5.9* 5.9*  BNP  --   --   --   --   --  128.2* 190.0*  DDIMER  --   --   --   --   --   --  0.83*  PROCALCITON  --   --   --   --   --  0.19  --   AST  --  34 31  --   --   --  26  ALT  --  26 22  --   --   --  20  ALKPHOS  --  95 78  --   --   --  62  BILITOT  --  0.8 0.8  --   --   --  0.8  ALBUMIN  --  4.6 4.1  --   --   --  3.5  LATICACIDVEN  --   --   --   --  1.4  --   --   SARSCOV2NAA POSITIVE*  --   --   --   --   --   --       2.  Mild right lower quadrant abdominal pain.  Initially thought to have viral gastroenteritis, overall symptoms have improved, she did have nausea vomiting seems to have now resolved.  Afebrile.  CT scan abdomen pelvis unremarkable, abdominal exam benign, she does take some narcotics at home and does stay constipated off and on.  For now we will monitor her off of antibiotics closely, currently on clear liquids will advance to soft and monitor.   3.  Fatty liver noted on CT scan.  Follow-up with PCP.  4.  Anxiety and depression.  Continue home medications.   5.  Dehydration with AKI, improving with IV fluids.       Condition - Fair  Family Communication  : Husband Tommie Raymond [phone] on 05/11/2020 at 11:10 AM-  mailbox has not been set up.  Code Status :  Full  Consults  :  none  Procedures  :    CT - No acute findings within the abdomen or pelvis. Mild hepatic steatosis. Stable small left diaphragmatic Bochdalek hernia containing gastric fundus  PUD Prophylaxis :  PPI  Disposition Plan  :    Status is: Inpatient  Remains inpatient appropriate because:IV treatments appropriate due to intensity of illness or inability to take PO   Dispo: The patient is from: Home              Anticipated d/c is to: Home              Anticipated d/c date is: 2 days              Patient currently is not medically stable to d/c.  DVT Prophylaxis  : Heparin  Lab Results  Component Value Date   PLT 280 05/11/2020    Diet :  Diet Order            Diet clear liquid Room service appropriate? Yes; Fluid consistency: Thin  Diet effective now                   Inpatient Medications  Scheduled Meds: . FLUoxetine  40 mg Oral Daily  . heparin  5,000 Units Subcutaneous Q12H  . lamoTRIgine  200 mg Oral BID  . risperiDONE  0.5 mg Oral BID  . tiZANidine  8 mg Oral TID  . traZODone  150 mg Oral QHS   Continuous Infusions: . sodium chloride 125 mL/hr at 05/10/20 2023   PRN Meds:.acetaminophen, guaiFENesin-dextromethorphan, labetalol, [DISCONTINUED] ondansetron **OR** ondansetron (ZOFRAN) IV, oxyCODONE  Antibiotics  :    Anti-infectives (From admission, onward)   None  Time Spent in minutes  30   Lala Lund M.D on 05/11/2020 at 11:07 AM  To page go to [email or web address]   [facility] -  Office  [phone]   See all Orders from today for further details    Objective:   Vitals:   05/10/20 2130 05/11/20 0018 05/11/20 0432 05/11/20 0821  BP: (!) 163/108 (!) 147/93 101/67 (!) 144/89  Pulse: 81 72 76 90  Resp: 17 18 18  (!) 21  Temp:  98.2 F (36.8 C) 98.2 F (36.8 C) 98.2 F (36.8 C)  TempSrc:  Oral Oral Oral  SpO2: 94% 96% 95% 97%  Weight:  94 kg      Wt Readings from Last 3 Encounters:  05/11/20 94 kg  03/28/20 96.3 kg  03/21/20 96 kg     Intake/Output Summary (Last 24 hours) at 05/11/2020 1107 Last data filed at 05/11/2020 0523 Gross per 24 hour  Intake 1125 ml  Output --  Net 1125 ml     Physical Exam  Awake Alert, No new F.N deficits, Normal affect [place].AT,PERRAL Supple Neck,No JVD, No cervical lymphadenopathy appriciated.  Symmetrical Chest wall movement, Good air movement bilaterally, CTAB RRR,No Gallops,Rubs or new Murmurs, No Parasternal Heave +ve B.Sounds, Abd Soft, No tenderness, No organomegaly appriciated, No rebound - guarding or rigidity. No Cyanosis, Clubbing or edema, No new Rash or bruise       Data Review:    CBC Recent Labs  Lab 05/10/20 0434 05/10/20 1700 05/10/20 1708 05/11/20 0114  WBC 18.9* 16.6*  --  11.1*  HGB 16.6* 14.7 14.6 12.5  HCT 46.9*  41.5 43.0 36.0  PLT 445* 376  --  280  MCV 90.9 90.8  --  92.8  MCH 32.2 32.2  --  32.2  MCHC 35.4 35.4  --  34.7  RDW 11.9 12.2  --  12.6  LYMPHSABS  --   --   --  1.1  MONOABS  --   --   --  1.0  EOSABS  --   --   --  0.0  BASOSABS  --   --   --  0.0    Recent Labs  Lab 05/10/20 0434 05/10/20 1700 05/10/20 1708 05/10/20 1742 05/10/20 2000 05/11/20 0114  NA 137 141 141  --   --  141  K 3.1* 2.9* 3.0*  --   --  3.5  CL 93* 99  --   --   --  105  CO2 17* 23  --   --   --  24  GLUCOSE 199* 145*  --   --   --  132*  BUN 12 13  --   --   --  12  CREATININE 1.73* 1.67*  --   --   --  1.31*  CALCIUM 10.4* 9.3  --   --   --  8.3*  AST 34 31  --   --   --  26  ALT 26 22  --   --   --  20  ALKPHOS 95 78  --   --   --  62  BILITOT 0.8 0.8  --   --   --  0.8  ALBUMIN 4.6 4.1  --   --   --  3.5  MG 1.9  --   --   --   --  2.0  CRP  --   --   --   --  5.9* 5.9*  DDIMER  --   --   --   --   --  0.83*  PROCALCITON  --   --   --   --  0.19  --   LATICACIDVEN  --   --   --  1.4  --   --   BNP  --   --   --   --  128.2* 190.0*    ------------------------------------------------------------------------------------------------------------------ Recent Labs    05/10/20 2000  TRIG 185*    No results found for: HGBA1C ------------------------------------------------------------------------------------------------------------------ No results for input(s): TSH, T4TOTAL, T3FREE, THYROIDAB in the last 72 hours.  Invalid input(s): FREET3  Cardiac Enzymes No results for input(s): CKMB, TROPONINI, MYOGLOBIN in the last 168 hours.  Invalid input(s): CK ------------------------------------------------------------------------------------------------------------------    Component Value Date/Time   BNP 190.0 (H) 05/11/2020 0114    Micro Results Recent Results (from the past 240 hour(s))  Resp Panel by RT-PCR (Flu A&B, Covid) Nasopharyngeal Swab     Status: Abnormal   Collection  Time: 05/10/20  4:27 AM   Specimen: Nasopharyngeal Swab; Nasopharyngeal(NP) swabs in vial transport medium  Result Value Ref Range Status   SARS Coronavirus 2 by RT PCR POSITIVE (A) NEGATIVE Final    Comment: RESULT CALLED TO, READ BACK BY AND VERIFIED WITH: Joline Maxcy RN 05/10/20 0630 JDW (NOTE) SARS-CoV-2 target nucleic acids are DETECTED.  The SARS-CoV-2 RNA is generally detectable in upper respiratory specimens during the acute phase of infection. Positive results are indicative of the presence of the identified virus, but do not rule out bacterial infection or co-infection with other pathogens not detected by the test. Clinical correlation with patient history and other diagnostic information is necessary to determine patient infection status. The expected result is Negative.  Fact Sheet for Patients: EntrepreneurPulse.com.au  Fact Sheet for Healthcare Providers: IncredibleEmployment.be  This test is not yet approved or cleared by the [place] FDA and  has been authorized for detection and/or diagnosis of SARS-CoV-2 by FDA under an Emergency Use Authorization (EUA).  This EUA will remain in effect (meaning this test can be  used) for the duration of  the COVID-19 declaration under Section 564(b)(1) of the Act, 21 U.S.C. section 360bbb-3(b)(1), unless the authorization is terminated or revoked sooner.     Influenza A by PCR NEGATIVE NEGATIVE Final   Influenza B by PCR NEGATIVE NEGATIVE Final    Comment: (NOTE) The Xpert Xpress SARS-CoV-2/FLU/RSV plus assay is intended as an aid in the diagnosis of influenza from Nasopharyngeal swab specimens and should not be used as a sole basis for treatment. Nasal washings and aspirates are unacceptable for Xpert Xpress SARS-CoV-2/FLU/RSV testing.  Fact Sheet for Patients: EntrepreneurPulse.com.au  Fact Sheet for Healthcare  Providers: IncredibleEmployment.be  This test is not yet approved or cleared by the [place] FDA and has been authorized for detection and/or diagnosis of SARS-CoV-2 by FDA under an Emergency Use Authorization (EUA). This EUA will remain in effect (meaning this test can be used) for the duration of the COVID-19 declaration under Section 564(b)(1) of the Act, 21 U.S.C. section 360bbb-3(b)(1), unless the authorization is terminated or revoked.  Performed at [facility], [place] [address], [place], [place] [address]   Blood Culture (routine x 2)     Status: None (Preliminary result)   Collection Time: 05/10/20  8:00 PM   Specimen: BLOOD  Result Value Ref Range Status   Specimen Description BLOOD LEFT ANTECUBITAL  Final   Special Requests   Final    BOTTLES DRAWN AEROBIC AND ANAEROBIC Blood Culture adequate volume   Culture   Final  NO GROWTH < 12 HOURS Performed at [facility] [address], [place], [place] [address]    Report Status PENDING  Incomplete  MRSA PCR Screening     Status: None   Collection Time: 05/11/20 12:28 AM   Specimen: Nasopharyngeal  Result Value Ref Range Status   MRSA by PCR NEGATIVE NEGATIVE Final    Comment:        The GeneXpert MRSA Assay (FDA approved for NASAL specimens only), is one component of a comprehensive MRSA colonization surveillance program. It is not intended to diagnose MRSA infection nor to guide or monitor treatment for MRSA infections. Performed at [facility], [place] [address] [place], [place] [address]     Radiology Reports DG Abd 1 View  Result Date: 05/11/2020 CLINICAL DATA:  Constipation.  COVID positive EXAM: ABDOMEN - 1 VIEW COMPARISON:  CT abdomen pelvis 05/10/2020 FINDINGS: Normal bowel gas pattern. No renal calculi. There is contrast in the urinary bladder from CT yesterday. No acute skeletal abnormality. IMPRESSION: Negative. Electronically Signed   By: Franchot Gallo M.D.    On: 05/11/2020 09:24   CT Abdomen Pelvis W Contrast  Result Date: 05/10/2020 CLINICAL DATA:  Left lower quadrant pain. Nausea and vomiting. Diarrhea. Chills. COVID-19 virus infection. EXAM: CT ABDOMEN AND PELVIS WITH CONTRAST TECHNIQUE: Multidetector CT imaging of the abdomen and pelvis was performed using the standard protocol following bolus administration of intravenous contrast. CONTRAST:  5mL OMNIPAQUE IOHEXOL 300 MG/ML  SOLN COMPARISON:  12/26/2018 FINDINGS: Lower Chest: No acute findings. Hepatobiliary: No hepatic masses identified. Mild diffuse hepatic steatosis noted. Gallbladder is unremarkable. No evidence of biliary ductal dilatation. Pancreas:  No mass or inflammatory changes. Spleen: Within normal limits in size and appearance. Adrenals/Urinary Tract: No masses identified. No evidence of ureteral calculi or hydronephrosis. Stomach/Bowel: Stable small left diaphragmatic Bochdalek hernia containing the gastric fundus. No evidence of obstruction, inflammatory process or abnormal fluid collections. Normal appendix visualized. Vascular/Lymphatic: No pathologically enlarged lymph nodes. No abdominal aortic aneurysm. Reproductive:  No mass or other significant abnormality. Other:  None. Musculoskeletal:  No suspicious bone lesions identified. IMPRESSION: No acute findings within the abdomen or pelvis. Mild hepatic steatosis. Stable small left diaphragmatic Bochdalek hernia containing gastric fundus. Electronically Signed   By: Marlaine Hind M.D.   On: 05/10/2020 14:36   DG Chest Portable 1 View  Result Date: 05/10/2020 CLINICAL DATA:  Nausea, vomiting and diarrhea. EXAM: PORTABLE CHEST 1 VIEW COMPARISON:  11/28/2018 FINDINGS: The heart size and mediastinal contours are within normal limits. Both lungs are clear. Scar noted within the right base. The visualized skeletal structures are unremarkable. IMPRESSION: No active disease. Electronically Signed   By: Kerby Moors M.D.   On: 05/10/2020 10:40

## 2020-05-11 NOTE — Plan of Care (Signed)

## 2020-05-12 LAB — CBC WITH DIFFERENTIAL/PLATELET
Abs Immature Granulocytes: 0.03 10*3/uL (ref 0.00–0.07)
Basophils Absolute: 0 10*3/uL (ref 0.0–0.1)
Basophils Relative: 1 %
Eosinophils Absolute: 0.3 10*3/uL (ref 0.0–0.5)
Eosinophils Relative: 4 %
HCT: 34 % — ABNORMAL LOW (ref 36.0–46.0)
Hemoglobin: 10.8 g/dL — ABNORMAL LOW (ref 12.0–15.0)
Immature Granulocytes: 1 %
Lymphocytes Relative: 17 %
Lymphs Abs: 1.1 10*3/uL (ref 0.7–4.0)
MCH: 31.2 pg (ref 26.0–34.0)
MCHC: 31.8 g/dL (ref 30.0–36.0)
MCV: 98.3 fL (ref 80.0–100.0)
Monocytes Absolute: 0.6 10*3/uL (ref 0.1–1.0)
Monocytes Relative: 10 %
Neutro Abs: 4.3 10*3/uL (ref 1.7–7.7)
Neutrophils Relative %: 67 %
Platelets: 192 10*3/uL (ref 150–400)
RBC: 3.46 MIL/uL — ABNORMAL LOW (ref 3.87–5.11)
RDW: 12.8 % (ref 11.5–15.5)
WBC: 6.4 10*3/uL (ref 4.0–10.5)
nRBC: 0 % (ref 0.0–0.2)

## 2020-05-12 LAB — COMPREHENSIVE METABOLIC PANEL
ALT: 24 U/L (ref 0–44)
AST: 30 U/L (ref 15–41)
Albumin: 2.9 g/dL — ABNORMAL LOW (ref 3.5–5.0)
Alkaline Phosphatase: 50 U/L (ref 38–126)
Anion gap: 8 (ref 5–15)
BUN: 9 mg/dL (ref 6–20)
CO2: 24 mmol/L (ref 22–32)
Calcium: 8.2 mg/dL — ABNORMAL LOW (ref 8.9–10.3)
Chloride: 108 mmol/L (ref 98–111)
Creatinine, Ser: 0.83 mg/dL (ref 0.44–1.00)
GFR, Estimated: >60 mL/min (ref >60)
Glucose, Bld: 108 mg/dL — ABNORMAL HIGH (ref 70–99)
Potassium: 3.8 mmol/L (ref 3.5–5.1)
Sodium: 140 mmol/L (ref 135–145)
Total Bilirubin: 0.4 mg/dL (ref 0.3–1.2)
Total Protein: 5.6 g/dL — ABNORMAL LOW (ref 6.5–8.1)

## 2020-05-12 LAB — BRAIN NATRIURETIC PEPTIDE: B Natriuretic Peptide: 149.3 pg/mL — ABNORMAL HIGH (ref 0.0–100.0)

## 2020-05-12 LAB — PROCALCITONIN: Procalcitonin: <0.1 ng/mL

## 2020-05-12 LAB — C-REACTIVE PROTEIN: CRP: 3.6 mg/dL — ABNORMAL HIGH (ref <1.0)

## 2020-05-12 LAB — MAGNESIUM: Magnesium: 1.9 mg/dL (ref 1.7–2.4)

## 2020-05-12 LAB — D-DIMER, QUANTITATIVE: D-Dimer, Quant: 0.62 ug/mL-FEU — ABNORMAL HIGH (ref 0.00–0.50)

## 2020-05-12 MED ORDER — ONDANSETRON HCL 4 MG PO TABS: 4.0000 mg | ORAL_TABLET | Freq: Three times a day (TID) | ORAL | 0 refills | Status: DC | PRN

## 2020-05-12 MED ORDER — LISINOPRIL 10 MG PO TABS: 10.0000 mg | ORAL_TABLET | Freq: Every day | ORAL | Status: DC

## 2020-05-12 MED ORDER — GUAIFENESIN-DM 100-10 MG/5ML PO SYRP: 10.0000 mL | ORAL_SOLUTION | Freq: Three times a day (TID) | ORAL | 0 refills | Status: DC | PRN

## 2020-05-12 MED ORDER — OMEPRAZOLE 20 MG PO CPDR: 20.0000 mg | DELAYED_RELEASE_CAPSULE | Freq: Every day | ORAL | 0 refills | Status: DC

## 2020-05-12 MED ORDER — HYDROCHLOROTHIAZIDE 25 MG PO TABS: 25.0000 mg | ORAL_TABLET | Freq: Every day | ORAL | Status: DC

## 2020-05-12 NOTE — TOC Initial Note (Signed)
Transition of Care [facility]) - Initial/Assessment Note    Patient Details  Name: [name] MRN: [id number] Date of Birth: Sep 27, 1970  [facility]) CM/SW Contact:    Zenon Mayo, RN Phone Number: 05/12/2020, 12:41 PM  Clinical Narrative:                 Patient is for dc today, she lives with her spouse, she has a w/chair at home, she states she does not need any other DME.  [facility] [place] [facility]  She still has cast on her foot so she is not able to do any therapy til that comes off.  She has no needs.  Expected Discharge Plan: Home/Self Care Barriers to Discharge: No Barriers Identified   Patient Goals and CMS Choice Patient states their goals for this hospitalization and ongoing recovery are:: get better   Choice offered to / list presented to : NA  Expected Discharge Plan and Services Expected Discharge Plan: Home/Self Care   Discharge Planning Services: CM Consult Post Acute Care Choice: NA Living arrangements for the past 2 months: Single Family Home Expected Discharge Date: 05/12/20                 DME Agency: NA       HH Arranged: NA          Prior Living Arrangements/Services Living arrangements for the past 2 months: Single Family Home Lives with:: Spouse Patient language and need for interpreter reviewed:: Yes Do you feel safe going back to the place where you live?: Yes      Need for Family Participation in Patient Care: Yes (Comment) Care giver support system in place?: Yes (comment) Current home services: DME (has a wheelchair) Criminal Activity/Legal Involvement Pertinent to Current Situation/Hospitalization: No - Comment as needed  Activities of Daily Living      Permission Sought/Granted                  Emotional Assessment   Attitude/Demeanor/Rapport: Engaged Affect (typically observed): Appropriate Orientation: : Oriented to Self,Oriented to Place,Oriented to Situation Alcohol / Substance Use:  Not Applicable Psych Involvement: No (comment)  Admission diagnosis:  AKI (acute kidney injury) ([place]) [N17.9] Patient Active Problem List   Diagnosis Date Noted  . AKI (acute kidney injury) ([place]) 05/10/2020  . Fracture of ankle, bimalleolar, right, closed 03/28/2020  . Iron deficiency 05/05/2019  . Symptomatic anemia 05/05/2019  . Nausea & vomiting 12/26/2018  . Essential hypertension 12/26/2018  . ARF (acute renal failure) ([place]) 12/26/2018  . Intractable nausea and vomiting 11/28/2018  . Bipolar affective disorder ([place]) 11/28/2018  . Acute gastroenteritis 11/19/2015  . C. difficile colitis   . SIRS (systemic inflammatory response syndrome) ([place]) 01/11/2015  . Cellulitis 01/03/2015  . Opiate withdrawal ([place]) 12/18/2014  . Herpes simplex virus type 1 (HSV-1) dermatitis   . Sacral fracture ([place]) 12/12/2014  . Leukocytosis 12/12/2014  . Chronic lower back pain 12/12/2014  . Sacral fracture, closed ([place]) 12/12/2014  . Nausea with vomiting   . Intractable abdominal pain 10/15/2014  . Abdominal pain 10/15/2014  . Hypokalemia 09/04/2014  . Sepsis ([place]) 09/01/2014  . Abdominal pain, generalized 09/01/2014  . PTSD (post-traumatic stress disorder) 09/01/2014  . Endometriosis 09/01/2014  . Sinus tachycardia 09/01/2014  . Lactic acidosis 09/01/2014  . Severe sepsis ([place]) 09/01/2014   PCP:  Andreas Blower, MD Pharmacy:   [facility] [place] [address]  [place] [address] Phone: [phone] Fax: [phone]     Social Determinants of Health (SDOH) Interventions    Readmission Risk Interventions Readmission Risk Prevention Plan 05/12/2020  Transportation Screening Complete  PCP or Specialist Appt within 3-5 Days Complete  HRI or [place] Complete  Social Work Consult for [place] Planning/Counseling Complete  Palliative Care Screening Not Applicable  Medication Review Press photographer) Complete  Some  recent data might be hidden

## 2020-05-12 NOTE — TOC Transition Note (Signed)
Transition of Care [facility]) - CM/SW Discharge Note   Patient Details  Name: [name] MRN: [id number] Date of Birth: 12-01-1970  Transition of Care [facility]) CM/SW Contact:  Zenon Mayo, RN Phone Number: 05/12/2020, 12:42 PM   Clinical Narrative:    Patient is for dc today, she lives with her spouse, she has a w/chair at home, she states she does not need any other DME.  She [place] [facility].  She still has cast on her foot so she is not able to do any therapy til that comes off.  She has no needs.   Final next level of care: Home/Self Care Barriers to Discharge: No Barriers Identified   Patient Goals and CMS Choice Patient states their goals for this hospitalization and ongoing recovery are:: get better   Choice offered to / list presented to : NA  Discharge Placement                       Discharge Plan and Services   Discharge Planning Services: CM Consult Post Acute Care Choice: NA            DME Agency: NA       HH Arranged: NA          Social Determinants of Health (SDOH) Interventions     Readmission Risk Interventions Readmission Risk Prevention Plan 05/12/2020  Transportation Screening Complete  PCP or Specialist Appt within 3-5 Days Complete  HRI or [place] Complete  Social Work Consult for [place] Planning/Counseling Complete  Palliative Care Screening Not Applicable  Medication Review Press photographer) Complete  Some recent data might be hidden

## 2020-05-12 NOTE — Evaluation (Signed)
Physical Therapy Evaluation Patient Details Name: [name] MRN: [id number] DOB: 01/03/1971 Today's Date: 05/12/2020   History of Present Illness  [name] is a 50 y.o. female with medical history significant of HTN, anxiety/depression, endometriosis, presented with new onset of right lower quadrant abdominal pain, and persistent nausea vomit, was also found to have incidental COVID infection. Of note: Pt with ankle fracture 03/21/20 with revision 03/28/20.  Clinical Impression  PTA pt living with husband and 2 adult sons in 1 story home with ramped entrance. Since trimalleolar surgery Dec 2021 pt has been NWB in RLE  and has been able to perform stand pivot transfers to wheelchair. Pt reports that she has fallen a couple of times and needs assist to get back up. Pt has been independent in bathing and dressing however relies on family for iADL. Pt is limited in safety by her impulsiveness with movement and decreased knowledge of safety and deficits. These are her likely her baseline. Pt is supervision for bed mobility and min A for transfers and 2 feet ambulation with RW. PT recommending no PT follow up until her cast is removed and she is weightbearing again. Pt will likely discharge today, however will remain on caseload until d/c.     Follow Up Recommendations No PT follow up;Supervision for mobility/OOB (pt to get PT after cast removed, at baseline level for s/p ankle sx)    Equipment Recommendations  None recommended by PT (has necessary equipment)       Precautions / Restrictions Precautions Precautions: Fall Precaution Comments: falls since Restrictions Weight Bearing Restrictions: Yes RLE Weight Bearing: Non weight bearing      Mobility  Bed Mobility Overal bed mobility: Needs Assistance Bed Mobility: Supine to Sit     Supine to sit: Supervision     General bed mobility comments: supervision for safety, impulsive, does not wait for lines and leads to be managed  as result IV pulls out    Transfers Overall transfer level: Needs assistance Equipment used: Rolling walker (2 wheeled) Transfers: Sit to/from Stand Sit to Stand: Min assist         General transfer comment: stands to RW prematurely, still attached to [place] requires increased cuing to sit back down, on second sit>stand requires increased multimodal cuing for correct hand placement to not pull RW back on her. min A to steady in standing  Ambulation/Gait Ambulation/Gait assistance: Min assist Gait Distance (Feet): 2 Feet Assistive device: Rolling walker (2 wheeled) Gait Pattern/deviations: Step-to pattern Gait velocity: slowed Gait velocity interpretation: <1.31 ft/sec, indicative of household ambulator General Gait Details: hop to pattern, increased cuing to back all the way up to chair before sitting  Stairs            Wheelchair Mobility    Modified Rankin (Stroke Patients Only)       Balance Overall balance assessment: Needs assistance Sitting-balance support: Feet supported;No upper extremity supported Sitting balance-Leahy Scale: Fair     Standing balance support: Bilateral upper extremity supported Standing balance-Leahy Scale: Poor                               Pertinent Vitals/Pain Pain Assessment: 0-10 Pain Score: 6  Pain Location: lower right abdomen Pain Descriptors / Indicators: Discomfort Pain Intervention(s): Limited activity within patient's tolerance;Monitored during session;Repositioned    Home Living Family/patient expects to be discharged to:: Private residence Living Arrangements: Spouse/significant other;Children (20, 23) Available Help  at Discharge: Family Type of Home: House Home Access: Stairs to enter;Other (comment) (they put a board down to function as a ramp) Entrance Stairs-Rails: None Entrance Stairs-Number of Steps: 3 Home Layout: One level Home Equipment: Walker - 2 wheels;Wheelchair - manual;Tub  bench;Bedside commode      Prior Function Level of Independence: Needs assistance   Gait / Transfers Assistance Needed: stand pivot transfers to w/c, hopping to bathroom across carpeted floor  ADL's / Homemaking Assistance Needed: uses tub bench to get into tub, independent in bathing and dressing, family provides all else           Extremity/Trunk Assessment   Upper Extremity Assessment Upper Extremity Assessment: Defer to OT evaluation    Lower Extremity Assessment Lower Extremity Assessment: RLE deficits/detail RLE Deficits / Details: R ankle cast, NWB RLE: Unable to fully assess due to immobilization       Communication   Communication: No difficulties  Cognition Arousal/Alertness: Awake/alert Behavior During Therapy: Impulsive;Flat affect Overall Cognitive Status: Impaired/Different from baseline Area of Impairment: Attention;Problem solving;Safety/judgement;Following commands                   Current Attention Level: Selective   Following Commands: Follows multi-step commands inconsistently;Follows one step commands inconsistently Safety/Judgement: Decreased awareness of safety   Problem Solving: Slow processing;Difficulty sequencing;Requires verbal cues;Requires tactile cues General Comments: pt impulsive, does not follow cues for safety, reports falls at home when transferring without assist      General Comments General comments (skin integrity, edema, etc.): Pt on 2L O2 via [place] on entry does not use at home, so removed and able to maintain SaO2 >89%O2 with transfer    Exercises     Assessment/Plan    PT Assessment Patient needs continued PT services  PT Problem List Decreased strength;Decreased range of motion;Decreased activity tolerance;Decreased balance;Decreased mobility;Decreased knowledge of use of DME;Decreased safety awareness;Pain       PT Treatment Interventions DME instruction;Gait training;Functional mobility training;Therapeutic  activities;Therapeutic exercise;Balance training;Cognitive remediation;Patient/family education    PT Goals (Current goals can be found in the Care Plan section)  Acute Rehab PT Goals Patient Stated Goal: go home PT Goal Formulation: With patient Time For Goal Achievement: 05/26/20 Potential to Achieve Goals: Fair    Frequency Min 3X/week    AM-PAC PT "6 Clicks" Mobility  Outcome Measure Help needed turning from your back to your side while in a flat bed without using bedrails?: None Help needed moving from lying on your back to sitting on the side of a flat bed without using bedrails?: None Help needed moving to and from a bed to a chair (including a wheelchair)?: A Little Help needed standing up from a chair using your arms (e.g., wheelchair or bedside chair)?: A Little Help needed to walk in hospital room?: A Little Help needed climbing 3-5 steps with a railing? : Total 6 Click Score: 18    End of Session Equipment Utilized During Treatment: Gait belt Activity Tolerance: Patient tolerated treatment well Patient left: in chair;with call bell/phone within reach;with chair alarm set Nurse Communication: Mobility status;Other (comment) (IV pulled) PT Visit Diagnosis: Unsteadiness on feet (R26.81);Other abnormalities of gait and mobility (R26.89);Muscle weakness (generalized) (M62.81);Repeated falls (R29.6);History of falling (Z91.81);Difficulty in walking, not elsewhere classified (R26.2);Pain Pain - part of body:  (abdomen)    Time: 4315-4008 PT Time Calculation (min) (ACUTE ONLY): 26 min   Charges:   PT Evaluation $PT Eval Moderate Complexity: 1 Mod  Quintel Mccalla B. Migdalia Dk PT, DPT Acute Rehabilitation Services Pager [phone] Office [phone]   [place] 05/12/2020, 10:20 AM

## 2020-05-12 NOTE — Discharge Summary (Signed)
[name]:[id number] DOB: 1970/09/02 DOA: 05/10/2020  PCP: Andreas Blower, MD  Admit date: 05/10/2020  Discharge date: 05/12/2020  Admitted From: Home   Disposition:  Home   Recommendations for Outpatient Follow-up:   Follow up with PCP in 1-2 weeks  PCP Please obtain BMP/CBC, 2 view CXR in 1week,  (see Discharge instructions)   PCP Please follow up on the following pending results: check CBC, CMP in 2 weeks, needs GI followup   Home Health: None  Equipment/Devices: None  Consultations: None  Discharge Condition: Stable    CODE STATUS: Full    Diet Recommendation: Heart Healthy   Diet Order            Diet - low sodium heart healthy           Diet clear liquid Room service appropriate? Yes; Fluid consistency: Thin  Diet effective now                  Chief Complaint  Patient presents with  . Emesis     Brief history of present illness from the day of admission and additional interim summary    Tracy G Allenis a 50 y.o.femalewith medical history significant ofHTN, anxiety/depression, endometriosis,presented with new onset of right lower quadrant abdominal pain,and persistent nausea vomit, was also found to have incidental COVID infection in the ER and admitted to the hospital for further care.                                                                 Hospital Course    1. Acute COVID-19 infection - she is not vaccinated and so far seems to have mild COVID-19 infection, no pulmonary symptoms.  She remains symptom-free on room air will be discharged home with outpatient PCP follow-up in 7 to 10 days.    Recent Labs  Lab 05/10/20 0427 05/10/20 0434 05/10/20 1700 05/10/20 1742 05/10/20 2000 05/11/20 0114 05/12/20 0042  WBC  --  18.9* 16.6*  --   --  11.1* 6.4   CRP  --   --   --   --  5.9* 5.9* 3.6*  DDIMER  --   --   --   --   --  0.83* 0.62*  BNP  --   --   --   --  128.2* 190.0* 149.3*  PROCALCITON  --   --   --   --  0.19 0.11 <0.10  LATICACIDVEN  --   --   --  1.4  --   --   --   AST  --  34 31  --   --  26 30  ALT  --  26 22  --   --  20 24  ALKPHOS  --  95 78  --   --  62 50  BILITOT  --  0.8 0.8  --   --  0.8 0.4  ALBUMIN  --  4.6 4.1  --   --  3.5 2.9*  SARSCOV2NAA POSITIVE*  --   --   --   --   --   --       2.   Viral gastroenteritis with early mild right lower quadrant abdominal pain -   Afebrile.  CT scan abdomen pelvis unremarkable, abdominal exam benign, she was treated with supportive care and now completely symptom-free, requested to follow-up with her PCP and gastroenterologist within a week.   3.  Fatty liver noted on CT scan.  Follow-up with PCP.  4.  Anxiety and depression.  Continue home medications.   5.  Dehydration with AKI - resolved with IV fluids, reintroduce diuretic and ACE inhibitor over the next few days.   Discharge diagnosis     Active Problems:   Abdominal pain, generalized   AKI (acute kidney injury) [facility])    Discharge instructions    Discharge Instructions    Diet - low sodium heart healthy   Complete by: As directed    Discharge instructions   Complete by: As directed    Follow with Primary MD Andreas Blower, MD in 7 days   Get CBC, CMP, 2 view Chest X ray -  checked next visit within 1 week by Primary MD    Activity: As tolerated with Full fall precautions use walker/cane & assistance as needed  Disposition Home   Diet: Heart Healthy    Special Instructions: If you have smoked or chewed Tobacco  in the last 2 yrs please stop smoking, stop any regular Alcohol  and or any Recreational drug use.  On your next visit with your primary care physician please Get Medicines reviewed and adjusted.  Please request your Prim.MD to go over all Hospital Tests and Procedure/Radiological  results at the follow up, please get all Hospital records sent to your Prim MD by signing hospital release before you go home.  If you experience worsening of your admission symptoms, develop shortness of breath, life threatening emergency, suicidal or homicidal thoughts you must seek medical attention immediately by calling 911 or calling your MD immediately  if symptoms less severe.  You Must read complete instructions/literature along with all the possible adverse reactions/side effects for all the Medicines you take and that have been prescribed to you. Take any new Medicines after you have completely understood and accpet all the possible adverse reactions/side effects.   Increase activity slowly   Complete by: As directed       Discharge Medications   Allergies as of 05/12/2020      Reactions   Lactose Intolerance (gi) Nausea Only      Medication List    STOP taking these medications   methocarbamol 500 MG tablet Commonly known as: ROBAXIN   oxyCODONE 5 MG immediate release tablet Commonly known as: Roxicodone   oxyCODONE-acetaminophen 5-325 MG tablet Commonly known as: PERCOCET/ROXICET     TAKE these medications   acetaminophen 325 MG tablet Commonly known as: TYLENOL Take 2 tablets (650 mg total) by mouth every 6 (six) hours as needed for mild pain (or Fever >/= 101).   amitriptyline 50 MG tablet Commonly known as: ELAVIL TAKE 1 TABLET BY MOUTH AT BEDTIME   FLUoxetine 40 MG capsule Commonly known as: PROZAC Take 40 mg by mouth daily.   guaiFENesin-dextromethorphan 100-10 MG/5ML syrup Commonly known as:  ROBITUSSIN DM Take 10 mLs by mouth every 8 (eight) hours as needed for cough.   hydrochlorothiazide 25 MG tablet Commonly known as: HYDRODIURIL Take 1 tablet (25 mg total) by mouth at bedtime. Start taking on: May 14, 2020 What changed: These instructions start on May 14, 2020. If you are unsure what to do until then, ask your doctor or other care  provider.   lamoTRIgine 200 MG tablet Commonly known as: LAMICTAL Take 200 mg by mouth 2 (two) times daily.   lisinopril 10 MG tablet Commonly known as: ZESTRIL Take 1 tablet (10 mg total) by mouth daily. Start taking on: May 15, 2020 What changed: These instructions start on May 15, 2020. If you are unsure what to do until then, ask your doctor or other care provider.   omeprazole 20 MG capsule Commonly known as: PRILOSEC Take 1 capsule (20 mg total) by mouth daily.   ondansetron 4 MG tablet Commonly known as: ZOFRAN Take 1 tablet (4 mg total) by mouth every 8 (eight) hours as needed for nausea or vomiting. What changed:   when to take this  reasons to take this  Another medication with the same name was removed. Continue taking this medication, and follow the directions you see here.   risperiDONE 0.5 MG tablet Commonly known as: RISPERDAL Take 0.5 mg by mouth 2 (two) times daily.   tiZANidine 4 MG tablet Commonly known as: ZANAFLEX Take 8 mg by mouth 3 (three) times daily.   traZODone 150 MG tablet Commonly known as: DESYREL Take 150 mg by mouth at bedtime.        Follow-up Information    Andreas Blower, MD. Schedule an appointment as soon as possible for a visit in 1 week(s).   Specialty: Family Medicine Why: and your GI doctor Contact information: [place] [address] [phone]               Major procedures and Radiology Reports - PLEASE review detailed and final reports thoroughly  -       DG Abd 1 View  Result Date: 05/11/2020 CLINICAL DATA:  Constipation.  COVID positive EXAM: ABDOMEN - 1 VIEW COMPARISON:  CT abdomen pelvis 05/10/2020 FINDINGS: Normal bowel gas pattern. No renal calculi. There is contrast in the urinary bladder from CT yesterday. No acute skeletal abnormality. IMPRESSION: Negative. Electronically Signed   By: Franchot Gallo M.D.   On: 05/11/2020 09:24   CT Abdomen Pelvis W Contrast  Result Date:  05/10/2020 CLINICAL DATA:  Left lower quadrant pain. Nausea and vomiting. Diarrhea. Chills. COVID-19 virus infection. EXAM: CT ABDOMEN AND PELVIS WITH CONTRAST TECHNIQUE: Multidetector CT imaging of the abdomen and pelvis was performed using the standard protocol following bolus administration of intravenous contrast. CONTRAST:  78mL OMNIPAQUE IOHEXOL 300 MG/ML  SOLN COMPARISON:  12/26/2018 FINDINGS: Lower Chest: No acute findings. Hepatobiliary: No hepatic masses identified. Mild diffuse hepatic steatosis noted. Gallbladder is unremarkable. No evidence of biliary ductal dilatation. Pancreas:  No mass or inflammatory changes. Spleen: Within normal limits in size and appearance. Adrenals/Urinary Tract: No masses identified. No evidence of ureteral calculi or hydronephrosis. Stomach/Bowel: Stable small left diaphragmatic Bochdalek hernia containing the gastric fundus. No evidence of obstruction, inflammatory process or abnormal fluid collections. Normal appendix visualized. Vascular/Lymphatic: No pathologically enlarged lymph nodes. No abdominal aortic aneurysm. Reproductive:  No mass or other significant abnormality. Other:  None. Musculoskeletal:  No suspicious bone lesions identified. IMPRESSION: No acute findings within the abdomen or pelvis. Mild hepatic  steatosis. Stable small left diaphragmatic Bochdalek hernia containing gastric fundus. Electronically Signed   By: Marlaine Hind M.D.   On: 05/10/2020 14:36   DG Chest Portable 1 View  Result Date: 05/10/2020 CLINICAL DATA:  Nausea, vomiting and diarrhea. EXAM: PORTABLE CHEST 1 VIEW COMPARISON:  11/28/2018 FINDINGS: The heart size and mediastinal contours are within normal limits. Both lungs are clear. Scar noted within the right base. The visualized skeletal structures are unremarkable. IMPRESSION: No active disease. Electronically Signed   By: Kerby Moors M.D.   On: 05/10/2020 10:40    Micro Results     Recent Results (from the past 240 hour(s))   Resp Panel by RT-PCR (Flu A&B, Covid) Nasopharyngeal Swab     Status: Abnormal   Collection Time: 05/10/20  4:27 AM   Specimen: Nasopharyngeal Swab; Nasopharyngeal(NP) swabs in vial transport medium  Result Value Ref Range Status   SARS Coronavirus 2 by RT PCR POSITIVE (A) NEGATIVE Final    Comment: RESULT CALLED TO, READ BACK BY AND VERIFIED WITH: Joline Maxcy RN 05/10/20 0630 JDW (NOTE) SARS-CoV-2 target nucleic acids are DETECTED.  The SARS-CoV-2 RNA is generally detectable in upper respiratory specimens during the acute phase of infection. Positive results are indicative of the presence of the identified virus, but do not rule out bacterial infection or co-infection with other pathogens not detected by the test. Clinical correlation with patient history and other diagnostic information is necessary to determine patient infection status. The expected result is Negative.  Fact Sheet for Patients: EntrepreneurPulse.com.au  Fact Sheet for Healthcare Providers: IncredibleEmployment.be  This test is not yet approved or cleared by the [place] FDA and  has been authorized for detection and/or diagnosis of SARS-CoV-2 by FDA under an Emergency Use Authorization (EUA).  This EUA will remain in effect (meaning this test can be  used) for the duration of  the COVID-19 declaration under Section 564(b)(1) of the Act, 21 U.S.C. section 360bbb-3(b)(1), unless the authorization is terminated or revoked sooner.     Influenza A by PCR NEGATIVE NEGATIVE Final   Influenza B by PCR NEGATIVE NEGATIVE Final    Comment: (NOTE) The Xpert Xpress SARS-CoV-2/FLU/RSV plus assay is intended as an aid in the diagnosis of influenza from Nasopharyngeal swab specimens and should not be used as a sole basis for treatment. Nasal washings and aspirates are unacceptable for Xpert Xpress SARS-CoV-2/FLU/RSV testing.  Fact Sheet for  Patients: EntrepreneurPulse.com.au  Fact Sheet for Healthcare Providers: IncredibleEmployment.be  This test is not yet approved or cleared by the [place] FDA and has been authorized for detection and/or diagnosis of SARS-CoV-2 by FDA under an Emergency Use Authorization (EUA). This EUA will remain in effect (meaning this test can be used) for the duration of the COVID-19 declaration under Section 564(b)(1) of the Act, 21 U.S.C. section 360bbb-3(b)(1), unless the authorization is terminated or revoked.  Performed at [facility], [place] [address], [place], [place] [address]   Blood Culture (routine x 2)     Status: None (Preliminary result)   Collection Time: 05/10/20  8:00 PM   Specimen: BLOOD  Result Value Ref Range Status   Specimen Description BLOOD LEFT ANTECUBITAL  Final   Special Requests   Final    BOTTLES DRAWN AEROBIC AND ANAEROBIC Blood Culture adequate volume   Culture   Final    NO GROWTH 2 DAYS Performed at [facility], [place] [address] [place], [place] [address]    Report Status PENDING  Incomplete  MRSA PCR Screening     Status: None   Collection Time: 05/11/20 12:28 AM   Specimen: Nasopharyngeal  Result Value Ref Range Status   MRSA by PCR NEGATIVE NEGATIVE Final    Comment:        The GeneXpert MRSA Assay (FDA approved for NASAL specimens only), is one component of a comprehensive MRSA colonization surveillance program. It is not intended to diagnose MRSA infection nor to guide or monitor treatment for MRSA infections. Performed at [facility], [place] [address], [place], [place] [address]   Blood Culture (routine x 2)     Status: None (Preliminary result)   Collection Time: 05/11/20  1:14 AM   Specimen: BLOOD  Result Value Ref Range Status   Specimen Description BLOOD RIGHT HAND  Final   Special Requests   Final    BOTTLES DRAWN AEROBIC AND ANAEROBIC Blood Culture adequate volume   Culture    Final    NO GROWTH 1 DAY Performed at [facility], [place] [address], [place], [place] [address]    Report Status PENDING  Incomplete    Today   Subjective    Shreya Lacasse today has no headache,no chest abdominal pain,no new weakness tingling or numbness, feels much better wants to go home today.     Objective   Blood pressure 108/72, pulse 78, temperature 98.1 F (36.7 C), temperature source Oral, resp. rate 17, weight 94 kg, last menstrual period 06/03/2018, SpO2 96 %.   Intake/Output Summary (Last 24 hours) at 05/12/2020 0902 Last data filed at 05/12/2020 0300 Gross per 24 hour  Intake 1022.12 ml  Output 250 ml  Net 772.12 ml    Exam  Awake Alert, No new F.N deficits, Normal affect [place].AT,PERRAL Supple Neck,No JVD, No cervical lymphadenopathy appriciated.  Symmetrical Chest wall movement, Good air movement bilaterally, CTAB RRR,No Gallops,Rubs or new Murmurs, No Parasternal Heave +ve B.Sounds, Abd Soft, Non tender, No organomegaly appriciated, No rebound -guarding or rigidity. No Cyanosis, Clubbing or edema, No new Rash or bruise   Data Review   CBC w Diff:  Lab Results  Component Value Date   WBC 6.4 05/12/2020   HGB 10.8 (L) 05/12/2020   HCT 34.0 (L) 05/12/2020   PLT 192 05/12/2020   LYMPHOPCT 17 05/12/2020   MONOPCT 10 05/12/2020   EOSPCT 4 05/12/2020   BASOPCT 1 05/12/2020    CMP:  Lab Results  Component Value Date   NA 140 05/12/2020   K 3.8 05/12/2020   CL 108 05/12/2020   CO2 24 05/12/2020   BUN 9 05/12/2020   CREATININE 0.83 05/12/2020   PROT 5.6 (L) 05/12/2020   ALBUMIN 2.9 (L) 05/12/2020   BILITOT 0.4 05/12/2020   ALKPHOS 50 05/12/2020   AST 30 05/12/2020   ALT 24 05/12/2020  .   Total Time in preparing paper work, data evaluation and todays exam - 57 minutes  Lala Lund M.D on 05/12/2020 at 9:02 AM  [facility]

## 2020-05-12 NOTE — Progress Notes (Signed)
RN went over discharge summary with pt. IVs removed. Pt dressed and belongings by side, including 2 bags and cell phone. Pt's husband to transport pt home.

## 2020-05-12 NOTE — Plan of Care (Signed)

## 2020-05-12 NOTE — Discharge Instructions (Signed)
Follow with Primary MD Andreas Blower, MD in 7 days   Get CBC, CMP, 2 view Chest X ray -  checked next visit within 1 week by Primary MD    Activity: As tolerated with Full fall precautions use walker/cane & assistance as needed  Disposition Home   Diet: Heart Healthy    Special Instructions: If you have smoked or chewed Tobacco  in the last 2 yrs please stop smoking, stop any regular Alcohol  and or any Recreational drug use.  On your next visit with your primary care physician please Get Medicines reviewed and adjusted.  Please request your Prim.MD to go over all Hospital Tests and Procedure/Radiological results at the follow up, please get all Hospital records sent to your Prim MD by signing hospital release before you go home.  If you experience worsening of your admission symptoms, develop shortness of breath, life threatening emergency, suicidal or homicidal thoughts you must seek medical attention immediately by calling 911 or calling your MD immediately  if symptoms less severe.  You Must read complete instructions/literature along with all the possible adverse reactions/side effects for all the Medicines you take and that have been prescribed to you. Take any new Medicines after you have completely understood and accpet all the possible adverse reactions/side effects.       Person Under Monitoring Name: [name]  Location: [place] [address]   Infection Prevention Recommendations for Individuals Confirmed to have, or Being Evaluated for, 2019 Novel Coronavirus (COVID-19) Infection Who Receive Care at Home  Individuals who are confirmed to have, or are being evaluated for, COVID-19 should follow the prevention steps below until a healthcare provider or local or state health department says they can return to normal activities.  Stay home except to get medical care You should restrict activities outside your home, except for  getting medical care. Do not go to work, school, or public areas, and do not use public transportation or taxis.  Call ahead before visiting your doctor Before your medical appointment, call the healthcare provider and tell them that you have, or are being evaluated for, COVID-19 infection. This will help the healthcare provider's office take steps to keep other people from getting infected. Ask your healthcare provider to call the local or state health department.  Monitor your symptoms Seek prompt medical attention if your illness is worsening (e.g., difficulty breathing). Before going to your medical appointment, call the healthcare provider and tell them that you have, or are being evaluated for, COVID-19 infection. Ask your healthcare provider to call the local or state health department.  Wear a facemask You should wear a facemask that covers your nose and mouth when you are in the same room with other people and when you visit a healthcare provider. People who live with or visit you should also wear a facemask while they are in the same room with you.  Separate yourself from other people in your home As much as possible, you should stay in a different room from other people in your home. Also, you should use a separate bathroom, if available.  Avoid sharing household items You should not share dishes, drinking glasses, cups, eating utensils, towels, bedding, or other items with other people in your home. After using these items, you should wash them thoroughly with soap and water.  Cover your coughs and sneezes Cover your mouth and nose with a tissue when you cough or sneeze, or you can  cough or sneeze into your sleeve. Throw used tissues in a lined trash can, and immediately wash your hands with soap and water for at least 20 seconds or use an alcohol-based hand rub.  Wash your [facility] your hands often and thoroughly with soap and water for at least 20 seconds. You can use  an alcohol-based hand sanitizer if soap and water are not available and if your hands are not visibly dirty. Avoid touching your eyes, nose, and mouth with unwashed hands.   Prevention Steps for Caregivers and Household Members of Individuals Confirmed to have, or Being Evaluated for, COVID-19 Infection Being Cared for in the Home  If you live with, or provide care at home for, a person confirmed to have, or being evaluated for, COVID-19 infection please follow these guidelines to prevent infection:  Follow healthcare provider's instructions Make sure that you understand and can help the patient follow any healthcare provider instructions for all care.  Provide for the patient's basic needs You should help the patient with basic needs in the home and provide support for getting groceries, prescriptions, and other personal needs.  Monitor the patient's symptoms If they are getting sicker, call his or her medical provider and tell them that the patient has, or is being evaluated for, COVID-19 infection. This will help the healthcare provider's office take steps to keep other people from getting infected. Ask the healthcare provider to call the local or state health department.  Limit the number of people who have contact with the patient  If possible, have only one caregiver for the patient.  Other household members should stay in another home or place of residence. If this is not possible, they should stay  in another room, or be separated from the patient as much as possible. Use a separate bathroom, if available.  Restrict visitors who do not have an essential need to be in the home.  Keep older adults, very young children, and other sick people away from the patient Keep older adults, very young children, and those who have compromised immune systems or chronic health conditions away from the patient. This includes people with chronic heart, lung, or kidney conditions, diabetes,  and cancer.  Ensure good ventilation Make sure that shared spaces in the home have good air flow, such as from an air conditioner or an opened window, weather permitting.  Wash your hands often  Wash your hands often and thoroughly with soap and water for at least 20 seconds. You can use an alcohol based hand sanitizer if soap and water are not available and if your hands are not visibly dirty.  Avoid touching your eyes, nose, and mouth with unwashed hands.  Use disposable paper towels to dry your hands. If not available, use dedicated cloth towels and replace them when they become wet.  Wear a facemask and gloves  Wear a disposable facemask at all times in the room and gloves when you touch or have contact with the patient's blood, body fluids, and/or secretions or excretions, such as sweat, saliva, sputum, nasal mucus, vomit, urine, or feces.  Ensure the mask fits over your nose and mouth tightly, and do not touch it during use.  Throw out disposable facemasks and gloves after using them. Do not reuse.  Wash your hands immediately after removing your facemask and gloves.  If your personal clothing becomes contaminated, carefully remove clothing and launder. Wash your hands after handling contaminated clothing.  Place all used disposable facemasks, gloves, and  other waste in a lined container before disposing them with other household waste.  Remove gloves and wash your hands immediately after handling these items.  Do not share dishes, glasses, or other household items with the patient  Avoid sharing household items. You should not share dishes, drinking glasses, cups, eating utensils, towels, bedding, or other items with a patient who is confirmed to have, or being evaluated for, COVID-19 infection.  After the person uses these items, you should wash them thoroughly with soap and water.  Wash laundry thoroughly  Immediately remove and wash clothes or bedding that have blood,  body fluids, and/or secretions or excretions, such as sweat, saliva, sputum, nasal mucus, vomit, urine, or feces, on them.  Wear gloves when handling laundry from the patient.  Read and follow directions on labels of laundry or clothing items and detergent. In general, wash and dry with the warmest temperatures recommended on the label.  Clean all areas the individual has used often  Clean all touchable surfaces, such as counters, tabletops, doorknobs, bathroom fixtures, toilets, phones, keyboards, tablets, and bedside tables, every day. Also, clean any surfaces that may have blood, body fluids, and/or secretions or excretions on them.  Wear gloves when cleaning surfaces the patient has come in contact with.  Use a diluted bleach solution (e.g., dilute bleach with 1 part bleach and 10 parts water) or a household disinfectant with a label that says EPA-registered for coronaviruses. To make a bleach solution at home, add 1 tablespoon of bleach to 1 quart (4 cups) of water. For a larger supply, add  cup of bleach to 1 gallon (16 cups) of water.  Read labels of cleaning products and follow recommendations provided on product labels. Labels contain instructions for safe and effective use of the cleaning product including precautions you should take when applying the product, such as wearing gloves or eye protection and making sure you have good ventilation during use of the product.  Remove gloves and wash hands immediately after cleaning.  Monitor yourself for signs and symptoms of illness Caregivers and household members are considered close contacts, should monitor their health, and will be asked to limit movement outside of the home to the extent possible. Follow the monitoring steps for close contacts listed on the symptom monitoring form.   ? If you have additional questions, contact your local health department or call the epidemiologist on call at [phone] (available 24/7). ? This  guidance is subject to change. For the most up-to-date guidance from [facility], please refer to their website: [email or web address]

## 2020-05-12 NOTE — Evaluation (Signed)
Occupational Therapy Evaluation Patient Details Name: [name] MRN: [id number] DOB: 24-May-1970 Today's Date: 05/12/2020    History of Present Illness [name] is a 50 y.o. female with medical history significant of HTN, anxiety/depression, endometriosis, presented with new onset of right lower quadrant abdominal pain, and persistent nausea vomit, was also found to have incidental COVID infection. Of note: Pt with ankle fracture 03/21/20 with revision 03/28/20.   Clinical Impression   Pt from home, and typically is independent in ADL and mobility with RW (hopping short distances in home) and utilizing manual WC for community. She reports that she falls on slippery floors at home attempting to hop and maintain NWB, but has a tub bench, and BSC for toileting needs, knows to dress RLE first. Today Pt is impulsive attempting to stand and mobilize while tangled in lines. Declining strengthening and HEP "I'll do therapy once I can put weight through my leg" when educated on benefits of doing HEP to help BUE improve in strength/endurance so she can go further and do more while using RW and LLE. At this time, Pt is at her baseline post-ankle fx and repair, and so defer further therapy to when Pt can WB through RLE. Since Pt is set to dc home today OT will not follow acutely.     Follow Up Recommendations  No OT follow up;Supervision - Intermittent (consider HHOT once Pt can WB through RLE)    Equipment Recommendations  None recommended by OT (Pt has approrpriate DME at home)    Recommendations for Other Services       Precautions / Restrictions Precautions Precautions: Fall Precaution Comments: 3/4 falls since ankle fx sx Restrictions Weight Bearing Restrictions: Yes RLE Weight Bearing: Non weight bearing      Mobility Bed Mobility Overal bed mobility: Needs Assistance Bed Mobility: Supine to Sit     Supine to sit: Supervision     General bed mobility comments: supervision  for safety, impulsive, does not wait for lines and leads to be managed as result IV pulls out    Transfers Overall transfer level: Needs assistance Equipment used: Rolling walker (2 wheeled) Transfers: Sit to/from Stand Sit to Stand: Min assist         General transfer comment: stands to RW prematurely, still attached to [place] requires increased cuing to sit back down, on second sit>stand requires increased multimodal cuing for correct hand placement to not pull RW back on her. min A to steady in standing    Balance Overall balance assessment: Needs assistance Sitting-balance support: Feet supported;No upper extremity supported Sitting balance-Leahy Scale: Fair     Standing balance support: Bilateral upper extremity supported Standing balance-Leahy Scale: Poor                             ADL either performed or assessed with clinical judgement   ADL Overall ADL's : At baseline                                       General ADL Comments: At baseline since ankle sx early december. She has a tub bench, stays in bed frequently and can hop into the bathroom with RW. She can bathe and dress herself (knows to do R leg first) performs grooming in seated position. Will not order HH at this time despite frequent falls bc she  needs to save her [facility] visits for when she can WB through RLE.     Vision Patient Visual Report: No change from baseline       Perception     Praxis      Pertinent Vitals/Pain Pain Assessment: 0-10 Pain Score: 6  Pain Location: lower right abdomen Pain Descriptors / Indicators: Discomfort Pain Intervention(s): Limited activity within patient's tolerance;Monitored during session;Repositioned     Hand Dominance     Extremity/Trunk Assessment Upper Extremity Assessment Upper Extremity Assessment: Generalized weakness ("my arms get tired using the RW")   Lower Extremity Assessment Lower Extremity Assessment: Defer to PT  evaluation RLE Deficits / Details: R ankle cast, NWB RLE: Unable to fully assess due to immobilization       Communication Communication Communication: No difficulties   Cognition Arousal/Alertness: Awake/alert Behavior During Therapy: Impulsive;Flat affect Overall Cognitive Status: Impaired/Different from baseline Area of Impairment: Attention;Problem solving;Safety/judgement;Following commands                   Current Attention Level: Selective   Following Commands: Follows multi-step commands inconsistently;Follows one step commands inconsistently Safety/Judgement: Decreased awareness of safety   Problem Solving: Slow processing;Difficulty sequencing;Requires verbal cues;Requires tactile cues General Comments: pt impulsive, does not follow cues for safety, reports falls at home when transferring without assist   General Comments  Pt on 2L O2 via [place] on entry does not use at home, so removed and able to maintain SaO2 >89%O2 with transfer    Exercises     Shoulder Instructions      Home Living Family/patient expects to be discharged to:: Private residence Living Arrangements: Spouse/significant other;Children (20, 23) Available Help at Discharge: Family Type of Home: House Home Access: Stairs to enter;Other (comment) (they put a board down to function as a ramp) Entrance Stairs-Number of Steps: 3 Entrance Stairs-Rails: None Home Layout: One level     Bathroom Shower/Tub: Teacher, early years/pre: Standard Bathroom Accessibility: Yes How Accessible: Accessible via walker Home Equipment: [place] - 2 wheels;Wheelchair - manual;Tub bench;Bedside commode          Prior Functioning/Environment Level of Independence: Needs assistance  Gait / Transfers Assistance Needed: stand pivot transfers to w/c, hopping to bathroom with RW across carpeted floor ADL's / Homemaking Assistance Needed: uses tub bench to get into tub, independent in bathing and dressing,  family provides all else            OT Problem List: Decreased activity tolerance;Decreased safety awareness;Impaired balance (sitting and/or standing);Obesity      OT Treatment/Interventions:      OT Goals(Current goals can be found in the care plan section) Acute Rehab OT Goals Patient Stated Goal: go home OT Goal Formulation: With patient Time For Goal Achievement: 05/26/20 Potential to Achieve Goals: Good  OT Frequency:     Barriers to D/C:            Co-evaluation              AM-PAC OT "6 Clicks" Daily Activity     Outcome Measure Help from another person eating meals?: None Help from another person taking care of personal grooming?: A Little Help from another person toileting, which includes using toliet, bedpan, or urinal?: A Little Help from another person bathing (including washing, rinsing, drying)?: A Little Help from another person to put on and taking off regular upper body clothing?: None Help from another person to put on and taking off regular lower body clothing?:  None 6 Click Score: 21   End of Session Equipment Utilized During Treatment: Gait belt;Rolling walker Nurse Communication: Mobility status;Other (comment) (one of her IVs came out)  Activity Tolerance: Patient tolerated treatment well Patient left: in chair;with call bell/phone within reach;with chair alarm set  OT Visit Diagnosis: Other abnormalities of gait and mobility (R26.89);History of falling (Z91.81);Muscle weakness (generalized) (M62.81)                Time: 0626-9485 OT Time Calculation (min): 29 min Charges:  OT General Charges $OT Visit: 1 Visit  Jesse Sans OTR/L Acute Rehabilitation Services Pager: [phone] Office: [place] 05/12/2020, 12:51 PM

## 2020-05-15 LAB — CULTURE, BLOOD (ROUTINE X 2)
Culture: NO GROWTH
Special Requests: ADEQUATE

## 2020-05-16 LAB — CULTURE, BLOOD (ROUTINE X 2)
Culture: NO GROWTH
Special Requests: ADEQUATE

## 2020-05-30 DIAGNOSIS — S82851D Displaced trimalleolar fracture of right lower leg, subsequent encounter for closed fracture with routine healing: Secondary | ICD-10-CM | POA: Diagnosis not present

## 2020-06-08 ENCOUNTER — Emergency Department (HOSPITAL_COMMUNITY): Payer: Medicare Other

## 2020-06-08 ENCOUNTER — Inpatient Hospital Stay (HOSPITAL_COMMUNITY)
Admission: EM | Admit: 2020-06-08 | Discharge: 2020-06-23 | DRG: 871 | Disposition: A | Discharge status: Rehabilitation Facility | Payer: Medicare Other | Attending: Family Medicine | Admitting: Family Medicine

## 2020-06-08 ENCOUNTER — Inpatient Hospital Stay (HOSPITAL_COMMUNITY): Payer: Medicare Other

## 2020-06-08 ENCOUNTER — Encounter (HOSPITAL_COMMUNITY): Payer: Self-pay | Admitting: Emergency Medicine

## 2020-06-08 DIAGNOSIS — T829XXS Unspecified complication of cardiac and vascular prosthetic device, implant and graft, sequela: Secondary | ICD-10-CM

## 2020-06-08 DIAGNOSIS — R7303 Prediabetes: Secondary | ICD-10-CM | POA: Diagnosis present

## 2020-06-08 DIAGNOSIS — R32 Unspecified urinary incontinence: Secondary | ICD-10-CM | POA: Diagnosis present

## 2020-06-08 DIAGNOSIS — R109 Unspecified abdominal pain: Secondary | ICD-10-CM | POA: Diagnosis not present

## 2020-06-08 DIAGNOSIS — R570 Cardiogenic shock: Secondary | ICD-10-CM | POA: Diagnosis present

## 2020-06-08 DIAGNOSIS — Z79891 Long term (current) use of opiate analgesic: Secondary | ICD-10-CM | POA: Diagnosis not present

## 2020-06-08 DIAGNOSIS — M6282 Rhabdomyolysis: Secondary | ICD-10-CM

## 2020-06-08 DIAGNOSIS — Z8616 Personal history of COVID-19: Secondary | ICD-10-CM | POA: Diagnosis not present

## 2020-06-08 DIAGNOSIS — N179 Acute kidney failure, unspecified: Secondary | ICD-10-CM

## 2020-06-08 DIAGNOSIS — I10 Essential (primary) hypertension: Secondary | ICD-10-CM | POA: Diagnosis present

## 2020-06-08 DIAGNOSIS — I499 Cardiac arrhythmia, unspecified: Secondary | ICD-10-CM | POA: Diagnosis not present

## 2020-06-08 DIAGNOSIS — F32A Depression, unspecified: Secondary | ICD-10-CM | POA: Diagnosis present

## 2020-06-08 DIAGNOSIS — E875 Hyperkalemia: Secondary | ICD-10-CM | POA: Diagnosis present

## 2020-06-08 DIAGNOSIS — T68XXXA Hypothermia, initial encounter: Secondary | ICD-10-CM

## 2020-06-08 DIAGNOSIS — D638 Anemia in other chronic diseases classified elsewhere: Secondary | ICD-10-CM | POA: Diagnosis not present

## 2020-06-08 DIAGNOSIS — Z452 Encounter for adjustment and management of vascular access device: Secondary | ICD-10-CM | POA: Diagnosis not present

## 2020-06-08 DIAGNOSIS — R110 Nausea: Secondary | ICD-10-CM | POA: Diagnosis not present

## 2020-06-08 DIAGNOSIS — R1030 Lower abdominal pain, unspecified: Secondary | ICD-10-CM | POA: Diagnosis not present

## 2020-06-08 DIAGNOSIS — Z781 Physical restraint status: Secondary | ICD-10-CM

## 2020-06-08 DIAGNOSIS — G8929 Other chronic pain: Secondary | ICD-10-CM | POA: Diagnosis not present

## 2020-06-08 DIAGNOSIS — G9341 Metabolic encephalopathy: Secondary | ICD-10-CM | POA: Diagnosis not present

## 2020-06-08 DIAGNOSIS — R739 Hyperglycemia, unspecified: Secondary | ICD-10-CM | POA: Diagnosis not present

## 2020-06-08 DIAGNOSIS — E876 Hypokalemia: Secondary | ICD-10-CM | POA: Diagnosis not present

## 2020-06-08 DIAGNOSIS — K567 Ileus, unspecified: Secondary | ICD-10-CM

## 2020-06-08 DIAGNOSIS — J9601 Acute respiratory failure with hypoxia: Secondary | ICD-10-CM | POA: Diagnosis not present

## 2020-06-08 DIAGNOSIS — J690 Pneumonitis due to inhalation of food and vomit: Secondary | ICD-10-CM | POA: Diagnosis not present

## 2020-06-08 DIAGNOSIS — R092 Respiratory arrest: Secondary | ICD-10-CM | POA: Diagnosis not present

## 2020-06-08 DIAGNOSIS — I4510 Unspecified right bundle-branch block: Secondary | ICD-10-CM | POA: Diagnosis not present

## 2020-06-08 DIAGNOSIS — F419 Anxiety disorder, unspecified: Secondary | ICD-10-CM | POA: Diagnosis present

## 2020-06-08 DIAGNOSIS — E872 Acidosis: Secondary | ICD-10-CM | POA: Diagnosis present

## 2020-06-08 DIAGNOSIS — J90 Pleural effusion, not elsewhere classified: Secondary | ICD-10-CM | POA: Diagnosis not present

## 2020-06-08 DIAGNOSIS — R112 Nausea with vomiting, unspecified: Secondary | ICD-10-CM | POA: Diagnosis not present

## 2020-06-08 DIAGNOSIS — J320 Chronic maxillary sinusitis: Secondary | ICD-10-CM | POA: Diagnosis not present

## 2020-06-08 DIAGNOSIS — M1990 Unspecified osteoarthritis, unspecified site: Secondary | ICD-10-CM | POA: Diagnosis not present

## 2020-06-08 DIAGNOSIS — N170 Acute kidney failure with tubular necrosis: Secondary | ICD-10-CM | POA: Diagnosis present

## 2020-06-08 DIAGNOSIS — J9690 Respiratory failure, unspecified, unspecified whether with hypoxia or hypercapnia: Secondary | ICD-10-CM | POA: Diagnosis not present

## 2020-06-08 DIAGNOSIS — D62 Acute posthemorrhagic anemia: Secondary | ICD-10-CM

## 2020-06-08 DIAGNOSIS — K922 Gastrointestinal hemorrhage, unspecified: Secondary | ICD-10-CM | POA: Diagnosis not present

## 2020-06-08 DIAGNOSIS — T402X1A Poisoning by other opioids, accidental (unintentional), initial encounter: Secondary | ICD-10-CM | POA: Diagnosis present

## 2020-06-08 DIAGNOSIS — G931 Anoxic brain damage, not elsewhere classified: Secondary | ICD-10-CM | POA: Diagnosis not present

## 2020-06-08 DIAGNOSIS — Z91011 Allergy to milk products: Secondary | ICD-10-CM

## 2020-06-08 DIAGNOSIS — A419 Sepsis, unspecified organism: Secondary | ICD-10-CM | POA: Diagnosis not present

## 2020-06-08 DIAGNOSIS — E785 Hyperlipidemia, unspecified: Secondary | ICD-10-CM | POA: Diagnosis present

## 2020-06-08 DIAGNOSIS — J3489 Other specified disorders of nose and nasal sinuses: Secondary | ICD-10-CM | POA: Diagnosis not present

## 2020-06-08 DIAGNOSIS — D6489 Other specified anemias: Secondary | ICD-10-CM | POA: Diagnosis present

## 2020-06-08 DIAGNOSIS — Z9911 Dependence on respirator [ventilator] status: Secondary | ICD-10-CM | POA: Diagnosis not present

## 2020-06-08 DIAGNOSIS — I959 Hypotension, unspecified: Secondary | ICD-10-CM | POA: Diagnosis not present

## 2020-06-08 DIAGNOSIS — F4310 Post-traumatic stress disorder, unspecified: Secondary | ICD-10-CM | POA: Diagnosis present

## 2020-06-08 DIAGNOSIS — N186 End stage renal disease: Secondary | ICD-10-CM | POA: Diagnosis not present

## 2020-06-08 DIAGNOSIS — R404 Transient alteration of awareness: Secondary | ICD-10-CM | POA: Diagnosis not present

## 2020-06-08 DIAGNOSIS — I119 Hypertensive heart disease without heart failure: Secondary | ICD-10-CM | POA: Diagnosis not present

## 2020-06-08 DIAGNOSIS — R6521 Severe sepsis with septic shock: Secondary | ICD-10-CM | POA: Diagnosis present

## 2020-06-08 DIAGNOSIS — G4700 Insomnia, unspecified: Secondary | ICD-10-CM | POA: Diagnosis not present

## 2020-06-08 DIAGNOSIS — Z79899 Other long term (current) drug therapy: Secondary | ICD-10-CM

## 2020-06-08 DIAGNOSIS — Z85828 Personal history of other malignant neoplasm of skin: Secondary | ICD-10-CM

## 2020-06-08 DIAGNOSIS — I469 Cardiac arrest, cause unspecified: Secondary | ICD-10-CM | POA: Diagnosis not present

## 2020-06-08 DIAGNOSIS — E669 Obesity, unspecified: Secondary | ICD-10-CM | POA: Diagnosis present

## 2020-06-08 DIAGNOSIS — S0990XA Unspecified injury of head, initial encounter: Secondary | ICD-10-CM | POA: Diagnosis not present

## 2020-06-08 DIAGNOSIS — I517 Cardiomegaly: Secondary | ICD-10-CM | POA: Diagnosis not present

## 2020-06-08 DIAGNOSIS — R000 Tachycardia, unspecified: Secondary | ICD-10-CM | POA: Diagnosis not present

## 2020-06-08 DIAGNOSIS — R159 Full incontinence of feces: Secondary | ICD-10-CM | POA: Diagnosis present

## 2020-06-08 DIAGNOSIS — Z6830 Body mass index (BMI) 30.0-30.9, adult: Secondary | ICD-10-CM

## 2020-06-08 DIAGNOSIS — T829XXA Unspecified complication of cardiac and vascular prosthetic device, implant and graft, initial encounter: Secondary | ICD-10-CM

## 2020-06-08 DIAGNOSIS — Z4901 Encounter for fitting and adjustment of extracorporeal dialysis catheter: Secondary | ICD-10-CM | POA: Diagnosis not present

## 2020-06-08 DIAGNOSIS — K921 Melena: Secondary | ICD-10-CM

## 2020-06-08 DIAGNOSIS — F5089 Other specified eating disorder: Secondary | ICD-10-CM | POA: Diagnosis not present

## 2020-06-08 DIAGNOSIS — R680 Hypothermia, not associated with low environmental temperature: Secondary | ICD-10-CM | POA: Diagnosis present

## 2020-06-08 DIAGNOSIS — G479 Sleep disorder, unspecified: Secondary | ICD-10-CM | POA: Diagnosis not present

## 2020-06-08 DIAGNOSIS — T43621A Poisoning by amphetamines, accidental (unintentional), initial encounter: Secondary | ICD-10-CM | POA: Diagnosis present

## 2020-06-08 DIAGNOSIS — J9811 Atelectasis: Secondary | ICD-10-CM | POA: Diagnosis not present

## 2020-06-08 DIAGNOSIS — E871 Hypo-osmolality and hyponatremia: Secondary | ICD-10-CM | POA: Diagnosis not present

## 2020-06-08 DIAGNOSIS — I213 ST elevation (STEMI) myocardial infarction of unspecified site: Secondary | ICD-10-CM | POA: Diagnosis not present

## 2020-06-08 DIAGNOSIS — R1310 Dysphagia, unspecified: Secondary | ICD-10-CM | POA: Diagnosis not present

## 2020-06-08 DIAGNOSIS — D6959 Other secondary thrombocytopenia: Secondary | ICD-10-CM | POA: Diagnosis present

## 2020-06-08 DIAGNOSIS — F1910 Other psychoactive substance abuse, uncomplicated: Secondary | ICD-10-CM | POA: Diagnosis not present

## 2020-06-08 DIAGNOSIS — Z9151 Personal history of suicidal behavior: Secondary | ICD-10-CM

## 2020-06-08 DIAGNOSIS — F411 Generalized anxiety disorder: Secondary | ICD-10-CM | POA: Diagnosis not present

## 2020-06-08 DIAGNOSIS — F1510 Other stimulant abuse, uncomplicated: Secondary | ICD-10-CM | POA: Diagnosis not present

## 2020-06-08 DIAGNOSIS — Z8659 Personal history of other mental and behavioral disorders: Secondary | ICD-10-CM | POA: Diagnosis not present

## 2020-06-08 HISTORY — DX: Barrett's esophagus without dysplasia: K22.70

## 2020-06-08 LAB — HEMOGLOBIN AND HEMATOCRIT, BLOOD
HCT: 32.3 % — ABNORMAL LOW (ref 36.0–46.0)
Hemoglobin: 11.6 g/dL — ABNORMAL LOW (ref 12.0–15.0)

## 2020-06-08 LAB — I-STAT ARTERIAL BLOOD GAS, ED
Acid-base deficit: 7 mmol/L — ABNORMAL HIGH (ref 0.0–2.0)
Bicarbonate: 17.7 mmol/L — ABNORMAL LOW (ref 20.0–28.0)
Calcium, Ion: 0.73 mmol/L — CL (ref 1.15–1.40)
HCT: 32 % — ABNORMAL LOW (ref 36.0–46.0)
Hemoglobin: 10.9 g/dL — ABNORMAL LOW (ref 12.0–15.0)
O2 Saturation: 100 %
Patient temperature: 92.4
Potassium: 4.1 mmol/L (ref 3.5–5.1)
Sodium: 134 mmol/L — ABNORMAL LOW (ref 135–145)
TCO2: 19 mmol/L — ABNORMAL LOW (ref 22–32)
pCO2 arterial: 28.8 mmHg — ABNORMAL LOW (ref 32.0–48.0)
pH, Arterial: 7.382 (ref 7.350–7.450)
pO2, Arterial: 396 mmHg — ABNORMAL HIGH (ref 83.0–108.0)

## 2020-06-08 LAB — I-STAT CHEM 8, ED
BUN: >130 mg/dL — ABNORMAL HIGH (ref 6–20)
Calcium, Ion: 0.77 mmol/L — CL (ref 1.15–1.40)
Chloride: 96 mmol/L — ABNORMAL LOW (ref 98–111)
Creatinine, Ser: >18 mg/dL — ABNORMAL HIGH (ref 0.44–1.00)
Glucose, Bld: 163 mg/dL — ABNORMAL HIGH (ref 70–99)
HCT: 32 % — ABNORMAL LOW (ref 36.0–46.0)
Hemoglobin: 10.9 g/dL — ABNORMAL LOW (ref 12.0–15.0)
Potassium: 6.1 mmol/L — ABNORMAL HIGH (ref 3.5–5.1)
Sodium: 131 mmol/L — ABNORMAL LOW (ref 135–145)
TCO2: 8 mmol/L — ABNORMAL LOW (ref 22–32)

## 2020-06-08 LAB — POCT I-STAT 7, (LYTES, BLD GAS, ICA,H+H)
Acid-base deficit: 10 mmol/L — ABNORMAL HIGH (ref 0.0–2.0)
Bicarbonate: 15.9 mmol/L — ABNORMAL LOW (ref 20.0–28.0)
Calcium, Ion: 0.69 mmol/L — CL (ref 1.15–1.40)
HCT: 34 % — ABNORMAL LOW (ref 36.0–46.0)
Hemoglobin: 11.6 g/dL — ABNORMAL LOW (ref 12.0–15.0)
O2 Saturation: 99 %
Patient temperature: 94.5
Potassium: 4.6 mmol/L (ref 3.5–5.1)
Sodium: 134 mmol/L — ABNORMAL LOW (ref 135–145)
TCO2: 17 mmol/L — ABNORMAL LOW (ref 22–32)
pCO2 arterial: 32.2 mmHg (ref 32.0–48.0)
pH, Arterial: 7.291 — ABNORMAL LOW (ref 7.350–7.450)
pO2, Arterial: 144 mmHg — ABNORMAL HIGH (ref 83.0–108.0)

## 2020-06-08 LAB — TSH: TSH: 3.019 u[IU]/mL (ref 0.350–4.500)

## 2020-06-08 LAB — COMPREHENSIVE METABOLIC PANEL
ALT: 26 U/L (ref 0–44)
AST: 93 U/L — ABNORMAL HIGH (ref 15–41)
Albumin: 2.4 g/dL — ABNORMAL LOW (ref 3.5–5.0)
Alkaline Phosphatase: 109 U/L (ref 38–126)
BUN: 214 mg/dL — ABNORMAL HIGH (ref 6–20)
CO2: <7 mmol/L — ABNORMAL LOW (ref 22–32)
Calcium: 7.7 mg/dL — ABNORMAL LOW (ref 8.9–10.3)
Chloride: 89 mmol/L — ABNORMAL LOW (ref 98–111)
Creatinine, Ser: 24.91 mg/dL — ABNORMAL HIGH (ref 0.44–1.00)
GFR, Estimated: 1 mL/min — ABNORMAL LOW (ref >60)
Glucose, Bld: 180 mg/dL — ABNORMAL HIGH (ref 70–99)
Potassium: 6.5 mmol/L (ref 3.5–5.1)
Sodium: 137 mmol/L (ref 135–145)
Total Bilirubin: 0.9 mg/dL (ref 0.3–1.2)
Total Protein: 6.6 g/dL (ref 6.5–8.1)

## 2020-06-08 LAB — RENAL FUNCTION PANEL
Albumin: 2.3 g/dL — ABNORMAL LOW (ref 3.5–5.0)
Anion gap: 38 — ABNORMAL HIGH (ref 5–15)
BUN: 204 mg/dL — ABNORMAL HIGH (ref 6–20)
CO2: 11 mmol/L — ABNORMAL LOW (ref 22–32)
Calcium: 6.5 mg/dL — ABNORMAL LOW (ref 8.9–10.3)
Chloride: 86 mmol/L — ABNORMAL LOW (ref 98–111)
Creatinine, Ser: 22.55 mg/dL — ABNORMAL HIGH (ref 0.44–1.00)
GFR, Estimated: 2 mL/min — ABNORMAL LOW (ref >60)
Glucose, Bld: 235 mg/dL — ABNORMAL HIGH (ref 70–99)
Phosphorus: >30 mg/dL — ABNORMAL HIGH (ref 2.5–4.6)
Potassium: 4 mmol/L (ref 3.5–5.1)
Sodium: 135 mmol/L (ref 135–145)

## 2020-06-08 LAB — CBC
HCT: 33.2 % — ABNORMAL LOW (ref 36.0–46.0)
Hemoglobin: 10.6 g/dL — ABNORMAL LOW (ref 12.0–15.0)
MCH: 31.5 pg (ref 26.0–34.0)
MCHC: 31.9 g/dL (ref 30.0–36.0)
MCV: 98.8 fL (ref 80.0–100.0)
Platelets: 276 10*3/uL (ref 150–400)
RBC: 3.36 MIL/uL — ABNORMAL LOW (ref 3.87–5.11)
RDW: 13.6 % (ref 11.5–15.5)
WBC: 16.2 10*3/uL — ABNORMAL HIGH (ref 4.0–10.5)
nRBC: 0 % (ref 0.0–0.2)

## 2020-06-08 LAB — URINALYSIS, ROUTINE W REFLEX MICROSCOPIC
Bilirubin Urine: NEGATIVE
Glucose, UA: NEGATIVE mg/dL
Hgb urine dipstick: NEGATIVE
Ketones, ur: NEGATIVE mg/dL
Leukocytes,Ua: NEGATIVE
Nitrite: NEGATIVE
Protein, ur: NEGATIVE mg/dL
Specific Gravity, Urine: 1.023 (ref 1.005–1.030)
pH: 5 (ref 5.0–8.0)

## 2020-06-08 LAB — CORTISOL: Cortisol, Plasma: 63.4 ug/dL

## 2020-06-08 LAB — D-DIMER, QUANTITATIVE: D-Dimer, Quant: >20 ug/mL-FEU — ABNORMAL HIGH (ref 0.00–0.50)

## 2020-06-08 LAB — BRAIN NATRIURETIC PEPTIDE: B Natriuretic Peptide: 68.8 pg/mL (ref 0.0–100.0)

## 2020-06-08 LAB — PROTIME-INR
INR: 1.8 — ABNORMAL HIGH (ref 0.8–1.2)
Prothrombin Time: 19.9 seconds — ABNORMAL HIGH (ref 11.4–15.2)

## 2020-06-08 LAB — RAPID URINE DRUG SCREEN, HOSP PERFORMED
Amphetamines: POSITIVE — AB
Barbiturates: NOT DETECTED
Benzodiazepines: NOT DETECTED
Cocaine: NOT DETECTED
Opiates: NOT DETECTED
Tetrahydrocannabinol: NOT DETECTED

## 2020-06-08 LAB — HEMOGLOBIN A1C
Hgb A1c MFr Bld: 6.1 % — ABNORMAL HIGH (ref 4.8–5.6)
Mean Plasma Glucose: 128.37 mg/dL

## 2020-06-08 LAB — TROPONIN I (HIGH SENSITIVITY)
Troponin I (High Sensitivity): 70 ng/L — ABNORMAL HIGH (ref <18)
Troponin I (High Sensitivity): 92 ng/L — ABNORMAL HIGH (ref <18)

## 2020-06-08 LAB — PROCALCITONIN: Procalcitonin: 12.14 ng/mL

## 2020-06-08 LAB — ETHANOL: Alcohol, Ethyl (B): <10 mg/dL (ref <10)

## 2020-06-08 LAB — OSMOLALITY: Osmolality: 359 mOsm/kg (ref 275–295)

## 2020-06-08 LAB — MAGNESIUM: Magnesium: 2.7 mg/dL — ABNORMAL HIGH (ref 1.7–2.4)

## 2020-06-08 LAB — GLUCOSE, CAPILLARY
Glucose-Capillary: 181 mg/dL — ABNORMAL HIGH (ref 70–99)
Glucose-Capillary: 227 mg/dL — ABNORMAL HIGH (ref 70–99)

## 2020-06-08 LAB — SALICYLATE LEVEL: Salicylate Lvl: <7 mg/dL — ABNORMAL LOW (ref 7.0–30.0)

## 2020-06-08 LAB — CK: Total CK: 5564 U/L — ABNORMAL HIGH (ref 38–234)

## 2020-06-08 LAB — APTT: aPTT: 39 seconds — ABNORMAL HIGH (ref 24–36)

## 2020-06-08 LAB — LACTIC ACID, PLASMA
Lactic Acid, Venous: 4.4 mmol/L (ref 0.5–1.9)
Lactic Acid, Venous: 3.8 mmol/L (ref 0.5–1.9)

## 2020-06-08 LAB — ACETAMINOPHEN LEVEL: Acetaminophen (Tylenol), Serum: <10 ug/mL — ABNORMAL LOW (ref 10–30)

## 2020-06-08 MED ORDER — ETOMIDATE 2 MG/ML IV SOLN
INTRAVENOUS | Status: AC | PRN
  Administered 2020-06-08: 20 mg via INTRAVENOUS

## 2020-06-08 MED ORDER — PANTOPRAZOLE SODIUM 40 MG IV SOLR
40.0000 mg | INTRAVENOUS | Status: DC
  Administered 2020-06-08 – 2020-06-10 (×3): 40 mg via INTRAVENOUS
  Filled 2020-06-08 (×3): qty 40

## 2020-06-08 MED ORDER — SODIUM BICARBONATE 8.4 % IV SOLN
INTRAVENOUS | Status: AC | PRN
  Administered 2020-06-08: 50 meq via INTRAVENOUS

## 2020-06-08 MED ORDER — SODIUM CHLORIDE 0.9 % FOR CRRT: INTRAVENOUS_CENTRAL | Status: DC | PRN

## 2020-06-08 MED ORDER — DOCUSATE SODIUM 50 MG/5ML PO LIQD
100.0000 mg | Freq: Every day | ORAL | Status: DC | PRN
  Administered 2020-06-10: 100 mg
  Filled 2020-06-08 (×2): qty 10

## 2020-06-08 MED ORDER — SODIUM BICARBONATE 8.4 % IV SOLN
INTRAVENOUS | Status: DC
  Filled 2020-06-08: qty 850

## 2020-06-08 MED ORDER — PIPERACILLIN-TAZOBACTAM 3.375 G IVPB 30 MIN
3.3750 g | Freq: Four times a day (QID) | INTRAVENOUS | Status: DC
  Administered 2020-06-08 – 2020-06-13 (×17): 3.375 g via INTRAVENOUS
  Filled 2020-06-08 (×33): qty 50

## 2020-06-08 MED ORDER — INSULIN ASPART 100 UNIT/ML ~~LOC~~ SOLN
10.0000 [IU] | Freq: Once | SUBCUTANEOUS | Status: AC
  Administered 2020-06-08: 10 [IU] via INTRAVENOUS

## 2020-06-08 MED ORDER — STERILE WATER FOR INJECTION IV SOLN
INTRAVENOUS | Status: DC
  Filled 2020-06-08 (×3): qty 150

## 2020-06-08 MED ORDER — SODIUM CHLORIDE 0.9% FLUSH: 10.0000 mL | INTRAVENOUS | Status: DC | PRN

## 2020-06-08 MED ORDER — CALCIUM GLUCONATE 10 % IV SOLN
1.0000 g | Freq: Once | INTRAVENOUS | Status: DC
  Filled 2020-06-08: qty 10

## 2020-06-08 MED ORDER — ALBUMIN HUMAN 5 % IV SOLN
12.5000 g | Freq: Once | INTRAVENOUS | Status: AC
  Administered 2020-06-08: 12.5 g via INTRAVENOUS
  Filled 2020-06-08: qty 250

## 2020-06-08 MED ORDER — CALCIUM GLUCONATE-NACL 1-0.675 GM/50ML-% IV SOLN: 1.0000 g | Freq: Once | INTRAVENOUS | Status: DC

## 2020-06-08 MED ORDER — PRISMASOL BGK 4/2.5 32-4-2.5 MEQ/L REPLACEMENT SOLN
Status: DC
  Filled 2020-06-08 (×3): qty 5000

## 2020-06-08 MED ORDER — HEPARIN SODIUM (PORCINE) 1000 UNIT/ML DIALYSIS
1000.0000 [IU] | INTRAMUSCULAR | Status: DC | PRN
Start: 2020-06-08 — End: 2020-06-08
  Filled 2020-06-08: qty 6

## 2020-06-08 MED ORDER — SODIUM CHLORIDE 0.9 % IV SOLN
250.0000 mL | INTRAVENOUS | Status: DC
  Administered 2020-06-10 – 2020-06-12 (×3): 250 mL via INTRAVENOUS

## 2020-06-08 MED ORDER — MIDAZOLAM 50MG/50ML (1MG/ML) PREMIX INFUSION
0.0000 mg/h | INTRAVENOUS | Status: DC
  Filled 2020-06-08: qty 50

## 2020-06-08 MED ORDER — FENTANYL CITRATE (PF) 100 MCG/2ML IJ SOLN
50.0000 ug | INTRAMUSCULAR | Status: DC | PRN
  Administered 2020-06-08: 100 ug via INTRAVENOUS
  Administered 2020-06-09 (×2): 200 ug via INTRAVENOUS
  Administered 2020-06-09: 150 ug via INTRAVENOUS
  Filled 2020-06-08 (×2): qty 4
  Filled 2020-06-08: qty 2
  Filled 2020-06-08: qty 4

## 2020-06-08 MED ORDER — ORAL CARE MOUTH RINSE
15.0000 mL | OROMUCOSAL | Status: DC
  Administered 2020-06-08 – 2020-06-12 (×36): 15 mL via OROMUCOSAL

## 2020-06-08 MED ORDER — DEXTROSE 50 % IV SOLN: 50.0000 mL | Freq: Once | INTRAVENOUS | Status: AC

## 2020-06-08 MED ORDER — NOREPINEPHRINE 4 MG/250ML-% IV SOLN
INTRAVENOUS | Status: AC
  Administered 2020-06-08: 10 ug/min
  Filled 2020-06-08: qty 250

## 2020-06-08 MED ORDER — NOREPINEPHRINE 4 MG/250ML-% IV SOLN
0.0000 ug/min | INTRAVENOUS | Status: DC
  Administered 2020-06-08: 30 ug/min via INTRAVENOUS
  Filled 2020-06-08: qty 250

## 2020-06-08 MED ORDER — PHENYLEPHRINE HCL-NACL 10-0.9 MG/250ML-% IV SOLN
0.0000 ug/min | INTRAVENOUS | Status: DC
  Administered 2020-06-08: 290 ug/min via INTRAVENOUS
  Administered 2020-06-08: 20 ug/min via INTRAVENOUS
  Filled 2020-06-08 (×2): qty 250

## 2020-06-08 MED ORDER — POLYETHYLENE GLYCOL 3350 17 G PO PACK: 17.0000 g | PACK | Freq: Every day | ORAL | Status: DC | PRN

## 2020-06-08 MED ORDER — NOREPINEPHRINE 4 MG/250ML-% IV SOLN
2.0000 ug/min | INTRAVENOUS | Status: DC
  Administered 2020-06-08: 30 ug/min via INTRAVENOUS
  Administered 2020-06-08: 10 ug/min via INTRAVENOUS

## 2020-06-08 MED ORDER — PRISMASOL BGK 0/2.5 32-2.5 MEQ/L EC SOLN
Status: DC
  Filled 2020-06-08 (×6): qty 5000

## 2020-06-08 MED ORDER — INSULIN ASPART 100 UNIT/ML ~~LOC~~ SOLN
0.0000 [IU] | SUBCUTANEOUS | Status: DC
  Administered 2020-06-08: 2 [IU] via SUBCUTANEOUS
  Administered 2020-06-08: 3 [IU] via SUBCUTANEOUS
  Administered 2020-06-10 (×2): 1 [IU] via SUBCUTANEOUS
  Administered 2020-06-11 (×2): 2 [IU] via SUBCUTANEOUS
  Administered 2020-06-11: 3 [IU] via SUBCUTANEOUS
  Administered 2020-06-11 (×2): 1 [IU] via SUBCUTANEOUS
  Administered 2020-06-11: 2 [IU] via SUBCUTANEOUS
  Administered 2020-06-12 (×2): 3 [IU] via SUBCUTANEOUS
  Administered 2020-06-12: 2 [IU] via SUBCUTANEOUS
  Administered 2020-06-12: 3 [IU] via SUBCUTANEOUS
  Administered 2020-06-12 (×2): 2 [IU] via SUBCUTANEOUS
  Administered 2020-06-13: 3 [IU] via SUBCUTANEOUS
  Administered 2020-06-13: 2 [IU] via SUBCUTANEOUS
  Administered 2020-06-13: 5 [IU] via SUBCUTANEOUS
  Administered 2020-06-13: 2 [IU] via SUBCUTANEOUS
  Administered 2020-06-13: 3 [IU] via SUBCUTANEOUS
  Administered 2020-06-14 (×4): 2 [IU] via SUBCUTANEOUS
  Administered 2020-06-14: 1 [IU] via SUBCUTANEOUS
  Administered 2020-06-15 (×4): 2 [IU] via SUBCUTANEOUS
  Administered 2020-06-15: 3 [IU] via SUBCUTANEOUS
  Administered 2020-06-15 – 2020-06-16 (×5): 2 [IU] via SUBCUTANEOUS
  Administered 2020-06-16: 3 [IU] via SUBCUTANEOUS
  Administered 2020-06-17 (×3): 2 [IU] via SUBCUTANEOUS
  Administered 2020-06-17 – 2020-06-18 (×2): 3 [IU] via SUBCUTANEOUS
  Administered 2020-06-18 (×2): 1 [IU] via SUBCUTANEOUS
  Administered 2020-06-19: 5 [IU] via SUBCUTANEOUS
  Administered 2020-06-19 (×2): 2 [IU] via SUBCUTANEOUS
  Administered 2020-06-19: 1 [IU] via SUBCUTANEOUS
  Administered 2020-06-19: 3 [IU] via SUBCUTANEOUS
  Administered 2020-06-19: 2 [IU] via SUBCUTANEOUS
  Administered 2020-06-20: 1 [IU] via SUBCUTANEOUS
  Administered 2020-06-20 (×3): 2 [IU] via SUBCUTANEOUS
  Administered 2020-06-20: 1 [IU] via SUBCUTANEOUS
  Administered 2020-06-21: 2 [IU] via SUBCUTANEOUS
  Administered 2020-06-21: 1 [IU] via SUBCUTANEOUS
  Administered 2020-06-21: 2 [IU] via SUBCUTANEOUS
  Administered 2020-06-22: 1 [IU] via SUBCUTANEOUS
  Administered 2020-06-22: 2 [IU] via SUBCUTANEOUS
  Administered 2020-06-22 – 2020-06-23 (×5): 1 [IU] via SUBCUTANEOUS

## 2020-06-08 MED ORDER — PRISMASOL BGK 4/2.5 32-4-2.5 MEQ/L EC SOLN
Status: DC
  Filled 2020-06-08 (×11): qty 5000

## 2020-06-08 MED ORDER — CALCIUM GLUCONATE-NACL 2-0.675 GM/100ML-% IV SOLN
2.0000 g | Freq: Once | INTRAVENOUS | Status: AC
  Administered 2020-06-09: 2000 mg via INTRAVENOUS
  Filled 2020-06-08: qty 100

## 2020-06-08 MED ORDER — DEXTROSE 50 % IV SOLN
INTRAVENOUS | Status: AC
  Administered 2020-06-08: 50 mL via INTRAVENOUS
  Filled 2020-06-08: qty 50

## 2020-06-08 MED ORDER — SODIUM BICARBONATE 8.4 % IV SOLN
INTRAVENOUS | Status: AC
  Administered 2020-06-08: 50 meq
  Filled 2020-06-08: qty 50

## 2020-06-08 MED ORDER — CHLORHEXIDINE GLUCONATE CLOTH 2 % EX PADS
6.0000 | MEDICATED_PAD | Freq: Every day | CUTANEOUS | Status: DC
  Administered 2020-06-09 – 2020-06-16 (×7): 6 via TOPICAL

## 2020-06-08 MED ORDER — STERILE WATER FOR INJECTION IV SOLN
INTRAVENOUS | Status: DC
  Filled 2020-06-08: qty 150

## 2020-06-08 MED ORDER — PHENYLEPHRINE CONCENTRATED 100MG/250ML (0.4 MG/ML) INFUSION SIMPLE
0.0000 ug/min | INTRAVENOUS | Status: DC
  Administered 2020-06-08 (×2): 290 ug/min via INTRAVENOUS
  Administered 2020-06-10: 300 ug/min via INTRAVENOUS
  Administered 2020-06-10: 20 ug/min via INTRAVENOUS
  Filled 2020-06-08 (×3): qty 250

## 2020-06-08 MED ORDER — HEPARIN SODIUM (PORCINE) 1000 UNIT/ML DIALYSIS
1000.0000 [IU] | INTRAMUSCULAR | Status: DC | PRN
  Filled 2020-06-08: qty 3
  Filled 2020-06-08: qty 6

## 2020-06-08 MED ORDER — VASOPRESSIN 20 UNITS/100 ML INFUSION FOR SHOCK
0.0000 [IU]/min | INTRAVENOUS | Status: DC
  Administered 2020-06-08 – 2020-06-12 (×9): 0.03 [IU]/min via INTRAVENOUS
  Filled 2020-06-08 (×9): qty 100

## 2020-06-08 MED ORDER — HEPARIN SODIUM (PORCINE) 1000 UNIT/ML DIALYSIS
1000.0000 [IU] | INTRAMUSCULAR | Status: DC | PRN
  Filled 2020-06-08: qty 6

## 2020-06-08 MED ORDER — SODIUM CHLORIDE 0.9 % IV BOLUS: 1000.0000 mL | Freq: Once | INTRAVENOUS | Status: DC

## 2020-06-08 MED ORDER — CHLORHEXIDINE GLUCONATE 0.12% ORAL RINSE (MEDLINE KIT)
15.0000 mL | Freq: Two times a day (BID) | OROMUCOSAL | Status: DC
  Administered 2020-06-08 – 2020-06-12 (×9): 15 mL via OROMUCOSAL

## 2020-06-08 MED ORDER — SODIUM CHLORIDE 0.9% FLUSH
10.0000 mL | Freq: Two times a day (BID) | INTRAVENOUS | Status: DC
  Administered 2020-06-08 – 2020-06-11 (×7): 10 mL
  Administered 2020-06-12: 30 mL
  Administered 2020-06-12 – 2020-06-14 (×5): 10 mL
  Administered 2020-06-15: 20 mL
  Administered 2020-06-15 – 2020-06-23 (×14): 10 mL

## 2020-06-08 MED ORDER — NOREPINEPHRINE 16 MG/250ML-% IV SOLN
0.0000 ug/min | INTRAVENOUS | Status: DC
  Administered 2020-06-08: 25 ug/min via INTRAVENOUS
  Administered 2020-06-09: 26 ug/min via INTRAVENOUS
  Administered 2020-06-09: 30 ug/min via INTRAVENOUS
  Administered 2020-06-10: 25 ug/min via INTRAVENOUS
  Administered 2020-06-10: 22 ug/min via INTRAVENOUS
  Administered 2020-06-10: 40 ug/min via INTRAVENOUS
  Administered 2020-06-11: 11 ug/min via INTRAVENOUS
  Administered 2020-06-12: 2 ug/min via INTRAVENOUS
  Filled 2020-06-08 (×8): qty 250

## 2020-06-08 MED ORDER — ROCURONIUM BROMIDE 50 MG/5ML IV SOLN
INTRAVENOUS | Status: AC | PRN
  Administered 2020-06-08: 70 mg via INTRAVENOUS

## 2020-06-08 MED ORDER — SODIUM CHLORIDE 0.9 % IV SOLN
0.5000 mg/kg/h | INTRAVENOUS | Status: DC
  Administered 2020-06-08: 0.5 mg/kg/h via INTRAVENOUS
  Filled 2020-06-08 (×2): qty 5

## 2020-06-08 MED ORDER — MIDAZOLAM HCL 2 MG/2ML IJ SOLN
2.0000 mg | INTRAMUSCULAR | Status: DC | PRN
  Administered 2020-06-08 – 2020-06-09 (×4): 2 mg via INTRAVENOUS
  Filled 2020-06-08 (×4): qty 2

## 2020-06-08 NOTE — Progress Notes (Signed)
Patient transported from ED to CT and then to 75m12. No complications. Vitals stable throughout. RT will continue to monitor.

## 2020-06-08 NOTE — Progress Notes (Signed)
[place] Progress Note Patient Name: [name] DOB: [birth year] MRN: [id number]   Date of Service  06/08/2020  HPI/Events of Note  Ionized calcium 0.69  eICU Interventions  Calcium gluconate 2 gm iv x 1 ordered.        Frederik Pear 06/08/2020, 11:42 PM

## 2020-06-08 NOTE — Progress Notes (Addendum)
eLink Physician-Brief Progress Note Patient Name: [name] DOB: Jul 05, 1970 MRN: [id number]   Date of Service  06/08/2020  HPI/Events of Note  Hypotension.  eICU Interventions  Phenylephrine infusion ordered, Albumin 5 % 250 ml iv bolus x 2, stat ABG, H & H,  and ionized calcium to exclude these as contributing to the etiology of hypotension. Echocardiogram also ordered.                                    Kerry Kass Talea Manges 06/08/2020, 10:19 PM

## 2020-06-08 NOTE — Progress Notes (Signed)
Attempted NG / OG x2 w/ 2 diff RN's, could not advance.

## 2020-06-08 NOTE — Progress Notes (Signed)
Pharmacy Antibiotic Note  [name] is a 50 y.o. female admitted on 06/08/2020 with aspiration pneumonia.  S/p cardiac arrest with chest x-ray showing mild atelectasis.  Pharmacy has been consulted for zosyn dosing.  Patient will be started on CRRT per nephrology. WBC 16.2, Scr >18, Tm 92.9  Plan: Zosyn 3.375 g IV q6 hours (over 30 minutes) F/u clinical improvement, cultures, renal function  Height: 5\' 6"  (167.6 cm) IBW/kg (Calculated) : 59.3  Temp (24hrs), Avg:93.1 F (33.9 C), Min:92.9 F (33.8 C), Max:93.3 F (34.1 C)  Recent Labs  Lab 06/08/20 1423 06/08/20 1529  WBC 16.2*  --   CREATININE 24.91* >18.00*    CrCl cannot be calculated (This lab value cannot be used to calculate CrCl because it is not a number: >18.00).    Allergies  Allergen Reactions  . Lactose Intolerance (Gi) Nausea Only    Antimicrobials this admission: Zosyn 2/16 >>    Microbiology results: 2/16 BCx: ordered  Thank you for allowing pharmacy to be a part of this patient's care.  Dimple Nanas, PharmD PGY-1 Acute Care Pharmacy Resident Office: [phone] 06/08/2020 4:12 PM

## 2020-06-08 NOTE — H&P (Signed)
NAME:  [name], MRN:  [id number], DOB:  06-21-1970, LOS: 0 ADMISSION DATE:  06/08/2020, CONSULTATION DATE:  06/08/2020 REFERRING MD:  Dr. Karle Starch, ER, CHIEF COMPLAINT:  Cardiac arrest   Brief History:  50 yo female with hx of opiate use, PTSD, and depression was laying on the floor for several hours.  Found by EMS to have agonal respiratory pattern and then asystole.  Had [facility] airway placed and received epinephrine with 3 minutes of CPR before ROSC achieved.  In ER found to have severe acidosis, hypokalemia, acute renal failure, hypothermia, and shock.  Hx from chart and medical team.  Past Medical History:  PTSD, Depression, HTN, HLD, Headache, Opiate dependence, Back pain, Anxiety, Osteoarthritis, COVID 19 infection January 2022  Significant Hospital Events:  2/16 admit, start CRRT  Consults:  Nephrology  Procedures:  ETT 2/16 >> Arterial line 2/16 >> HD cath 2/16 >> CVL 2/16 >>   Significant Diagnostic Tests:   CT head 2/16 >>   Echo 2/16 >>   Renal u/s 2/16 >>   Micro Data:  Blood 2/16 >> Sputum 2/16 >>  Antimicrobials:  Zosyn 2/16 >>    Interim History / Subjective:    Objective   Blood pressure (!) 89/49, pulse 96, temperature (!) 92.9 F (33.8 C), resp. rate (!) 24, height 5\' 6"  (1.676 m), last menstrual period 06/03/2018, SpO2 100 %.    Vent Mode: PRVC FiO2 (%):  [100 %] 100 % Set Rate:  [18 bmp] 18 bmp Vt Set:  [470 mL] 470 mL PEEP:  [5 cmH20] 5 cmH20 Plateau Pressure:  [16 cmH20] 16 cmH20  No intake or output data in the 24 hours ending 06/08/20 1601 There were no vitals filed for this visit.  Examination:  General - seen in ER, on pressors, intubated Eyes - pupils sluggish, Lt eye dilated more than Rt ENT - ETT in place Cardiac - regular, no murmur Chest - decreased BS at based Rt > Lt, no wheezing Abdomen - soft, decreased bowel sounds Extremities - hands and feet cold, mottled Skin - no rashes Neuro - received etomidate and  paralytic for intubation just prior to my assessment  Resolved Hospital Problem list     Assessment & Plan:   Cardiac arrest with asystole with cardiogenic shock. - likely from opiate overdose - pressors to keep MAP > 65 - f/u Echo  Acute metabolic encephalopathy 2nd to hypoxia, renal failure, sepsis. Hx of depression, PTSD, anxiety, chronic opiate use. - f/u CT head, UDS  Septic shock likely from aspiration pneumonitis. - f/u sputum, blood culture - start zosyn  AKI from ATN in setting of shock and hypoxia. Hyperkalemia with ECG changes. Anion gap metabolic acidosis. Hypocalcemia. - creatinine 0.83 from 05/12/20 - nephrology consulted; will place HD catheter and start CRRT - f/u BMET, ABG - check serum osmolarity, acetaminophen level, salicylate level - HCO3 in IV fluids  Hypothermia. - check TSH, cortisol - external warming blanket  Acute hypoxic respiratory failure. - full vent support - f/u CXR, ABG  Hyperglycemia. - SSI   Best practice (evaluated daily)  Diet: NPO DVT prophylaxis: SCDs GI prophylaxis: protonix Mobility: bed rest Disposition: ICU  Goals of Care:  Last date of multidisciplinary goals of care discussion: pending Family and staff present: pending Summary of discussion: pending Follow up goals of care discussion due: pending Code Status: ICU  Labs    CMP Latest Ref Rng & Units 06/08/2020 06/08/2020 05/12/2020  Glucose 70 - 99 mg/dL 163(H) 180(H)  108(H)  BUN 6 - 20 mg/dL >130(H) 214(H) 9  Creatinine 0.44 - 1.00 mg/dL >18.00(H) 24.91(H) 0.83  Sodium 135 - 145 mmol/L 131(L) 137 140  Potassium 3.5 - 5.1 mmol/L 6.1(H) 6.5(HH) 3.8  Chloride 98 - 111 mmol/L 96(L) 89(L) 108  CO2 22 - 32 mmol/L - <7(L) 24  Calcium 8.9 - 10.3 mg/dL - 7.7(L) 8.2(L)  Total Protein 6.5 - 8.1 g/dL - 6.6 5.6(L)  Total Bilirubin 0.3 - 1.2 mg/dL - 0.9 0.4  Alkaline Phos 38 - 126 U/L - 109 50  AST 15 - 41 U/L - 93(H) 30  ALT 0 - 44 U/L - 26 24    CBC Latest Ref Rng  & Units 06/08/2020 06/08/2020 05/12/2020  WBC 4.0 - 10.5 K/uL - 16.2(H) 6.4  Hemoglobin 12.0 - 15.0 g/dL 10.9(L) 10.6(L) 10.8(L)  Hematocrit 36.0 - 46.0 % 32.0(L) 33.2(L) 34.0(L)  Platelets 150 - 400 K/uL - 276 192    ABG    Component Value Date/Time   HCO3 25.0 05/11/2020 0114   TCO2 8 (L) 06/08/2020 1529   ACIDBASEDEF 5.3 (H) 01/01/2015 1219   O2SAT 91.2 05/11/2020 0114    CBG (last 3)  No results for input(s): GLUCAP in the last 72 hours.   Review of Systems:   Unable to obtain  Past Medical History:  She,  has a past medical history of Anxiety, Arthritis, Bulging lumbar disc, Chronic lower back pain, DDD (degenerative disc disease), cervical, Depression, Drug-seeking behavior, Headache, Hyperlipemia, Hypertension, PTSD (post-traumatic stress disorder), and Skin cancer.   Surgical History:   Past Surgical History:  Procedure Laterality Date  . ABLATION ON ENDOMETRIOSIS  2008  . BIOPSY  12/27/2018   Procedure: BIOPSY;  Surgeon: Thornton Park, MD;  Location: [facility];  Service: Gastroenterology;;  . ESOPHAGOGASTRODUODENOSCOPY (EGD) WITH PROPOFOL N/A 12/27/2018   Procedure: ESOPHAGOGASTRODUODENOSCOPY (EGD) WITH PROPOFOL;  Surgeon: Thornton Park, MD;  Location: [facility];  Service: Gastroenterology;  Laterality: N/A;  . HEMORRHOID SURGERY  ~ 2002  . ORIF ANKLE FRACTURE Right 03/28/2020   Procedure: OPEN REDUCTION INTERNAL FIXATION (ORIF) RIGHT BIMALLEOLAR ANKLE FRACTURE;  Surgeon: Marchia Bond, MD;  Location: [place];  Service: Orthopedics;  Laterality: Right;     Social History:   reports that she has never smoked. She has never used smokeless tobacco. She reports previous alcohol use. She reports that she does not use drugs.   Family History:  Her family history includes Breast cancer in her maternal grandmother, mother, and paternal grandmother; Colon polyps in her paternal grandmother; Diabetes in her mother. There is no history of Colon  cancer, Esophageal cancer, Rectal cancer, or Stomach cancer.   Allergies Allergies  Allergen Reactions  . Lactose Intolerance (Gi) Nausea Only     Home Medications  Prior to Admission medications   Medication Sig Start Date End Date Taking? Authorizing Provider  FLUoxetine (PROZAC) 40 MG capsule Take 40 mg by mouth daily.  06/26/17   [provider]  guaiFENesin-dextromethorphan (ROBITUSSIN DM) 100-10 MG/5ML syrup Take 10 mLs by mouth every 8 (eight) hours as needed for cough. 05/12/20   Thurnell Lose, MD  hydrochlorothiazide (HYDRODIURIL) 25 MG tablet Take 1 tablet (25 mg total) by mouth at bedtime. 05/14/20   Thurnell Lose, MD  lamoTRIgine (LAMICTAL) 200 MG tablet Take 200 mg by mouth 2 (two) times daily. 04/29/20   [provider]  lisinopril (ZESTRIL) 10 MG tablet Take 1 tablet (10 mg total) by mouth daily. 05/15/20   Candiss Norse,  Margaree Mackintosh, MD  omeprazole (PRILOSEC) 20 MG capsule Take 1 capsule (20 mg total) by mouth daily. 05/12/20   Thurnell Lose, MD  ondansetron (ZOFRAN) 4 MG tablet Take 1 tablet (4 mg total) by mouth every 8 (eight) hours as needed for nausea or vomiting. 05/12/20   Thurnell Lose, MD  risperiDONE (RISPERDAL) 0.5 MG tablet Take 0.5 mg by mouth 2 (two) times daily. 03/03/18   [provider]  tiZANidine (ZANAFLEX) 4 MG tablet Take 8 mg by mouth 3 (three) times daily.    [provider]  traZODone (DESYREL) 150 MG tablet Take 150 mg by mouth at bedtime.  06/03/17   [provider]     Critical care time: 43 minutes  Chesley Mires, MD [place] Pager - [phone] 06/08/2020, 4:31 PM

## 2020-06-08 NOTE — Consult Note (Signed)
Cardiology was asked to see [name] because of witnessed arrest.  Her EKG does not meet STEMI criteria.  Her bedside echo performed by Dr. Audie Box revealed normal LV systolic function with a normal-appearing RV.  There were no regional wall motion normalities.  Based on this it was decided not to take her to the Cath Lab.  PCCM will manage.`   Lorretta Harp, M.D., [place], [facility], [place] [address] [place], [place]  [address]  [phone] 06/08/2020 3:14 PM

## 2020-06-08 NOTE — ED Provider Notes (Signed)
[place] EMERGENCY DEPARTMENT Provider Note  CSN: [id number] Arrival date & time: 06/08/20 1404    History Chief Complaint  Patient presents with  . Post CPR    HPI  [name] is a 50 y.o. female brought to the ED via EMS for post-CPR. She apparently fell out of bed at some point during the night and family just let her sleep on the floor. This afternoon when she had not gotten up they checked on her again and she was unresponsive. First Responders found her to be pulseless and asystolic, no shocks on AED. She was intubated with a [place], had chest compressions and Epi x 1 with ROSC. Immediately post ROSC her EKG was concerning for STEMI but subsequent EKG were improved. She was looking around but not responding to verbal stimuli or commands. She was recently admitted for GI symptoms and Covid about a month ago. She also had a R ankle fracture needing ORIF about 3 months ago, still wearing a boot.    Past Medical History:  Diagnosis Date  . Anxiety   . Arthritis    "joints ache all over" (10/15/2014)  . Bulging lumbar disc   . Chronic lower back pain   . DDD (degenerative disc disease), cervical   . Depression   . Drug-seeking behavior   . Headache    "weekly" (10/15/2014)  . Hyperlipemia   . Hypertension   . PTSD (post-traumatic stress disorder)   . Skin cancer    "had them cut off my arms; don't know what kind"    Past Surgical History:  Procedure Laterality Date  . ABLATION ON ENDOMETRIOSIS  2008  . BIOPSY  12/27/2018   Procedure: BIOPSY;  Surgeon: Thornton Park, MD;  Location: [facility];  Service: Gastroenterology;;  . ESOPHAGOGASTRODUODENOSCOPY (EGD) WITH PROPOFOL N/A 12/27/2018   Procedure: ESOPHAGOGASTRODUODENOSCOPY (EGD) WITH PROPOFOL;  Surgeon: Thornton Park, MD;  Location: [facility];  Service: Gastroenterology;  Laterality: N/A;  . HEMORRHOID SURGERY  ~ 2002  . ORIF ANKLE FRACTURE Right 03/28/2020   Procedure: OPEN REDUCTION INTERNAL  FIXATION (ORIF) RIGHT BIMALLEOLAR ANKLE FRACTURE;  Surgeon: Marchia Bond, MD;  Location: [place];  Service: Orthopedics;  Laterality: Right;    Family History  Problem Relation Age of Onset  . Breast cancer Mother   . Diabetes Mother   . Breast cancer Maternal Grandmother   . Breast cancer Paternal Grandmother   . Colon polyps Paternal Grandmother   . Colon cancer Neg Hx   . Esophageal cancer Neg Hx   . Rectal cancer Neg Hx   . Stomach cancer Neg Hx     Social History   Tobacco Use  . Smoking status: Never Smoker  . Smokeless tobacco: Never Used  Vaping Use  . Vaping Use: Never used  Substance Use Topics  . Alcohol use: Not Currently    Comment: 10/15/2014 "I've drank before; nothing regular; don't drink now cause of RX I'm on"  . Drug use: No     Home Medications Prior to Admission medications   Medication Sig Start Date End Date Taking? Authorizing Provider  acetaminophen (TYLENOL) 325 MG tablet Take 2 tablets (650 mg total) by mouth every 6 (six) hours as needed for mild pain (or Fever >/= 101). Patient not taking: Reported on 05/10/2020 12/28/18   Oswald Hillock, MD  amitriptyline (ELAVIL) 50 MG tablet TAKE 1 TABLET BY MOUTH AT BEDTIME Patient not taking: Reported on 05/10/2020 07/23/19   Thornton Park, MD  FLUoxetine (PROZAC) 40 MG capsule Take 40 mg by mouth daily.  06/26/17   [provider]  guaiFENesin-dextromethorphan (ROBITUSSIN DM) 100-10 MG/5ML syrup Take 10 mLs by mouth every 8 (eight) hours as needed for cough. 05/12/20   Thurnell Lose, MD  hydrochlorothiazide (HYDRODIURIL) 25 MG tablet Take 1 tablet (25 mg total) by mouth at bedtime. 05/14/20   Thurnell Lose, MD  lamoTRIgine (LAMICTAL) 200 MG tablet Take 200 mg by mouth 2 (two) times daily. 04/29/20   [provider]  lisinopril (ZESTRIL) 10 MG tablet Take 1 tablet (10 mg total) by mouth daily. 05/15/20   Thurnell Lose, MD  omeprazole (PRILOSEC) 20 MG capsule Take 1  capsule (20 mg total) by mouth daily. 05/12/20   Thurnell Lose, MD  ondansetron (ZOFRAN) 4 MG tablet Take 1 tablet (4 mg total) by mouth every 8 (eight) hours as needed for nausea or vomiting. 05/12/20   Thurnell Lose, MD  risperiDONE (RISPERDAL) 0.5 MG tablet Take 0.5 mg by mouth 2 (two) times daily. 03/03/18   [provider]  tiZANidine (ZANAFLEX) 4 MG tablet Take 8 mg by mouth 3 (three) times daily.    [provider]  traZODone (DESYREL) 150 MG tablet Take 150 mg by mouth at bedtime.  06/03/17   [provider]     Allergies    Lactose intolerance (gi)   Review of Systems   Review of Systems Unable to assess due to mental status.    Physical Exam BP (!) 72/60   Pulse 89   Temp (!) 93.2 F (34 C)   Resp 18   Ht 5\' 6"  (1.676 m)   LMP 06/03/2018 (Approximate) Comment: neg hcg 05/10/20  SpO2 99%   BMI 33.45 kg/m   Physical Exam Vitals and nursing note reviewed.  Constitutional:      Appearance: Normal appearance.  HENT:     Head: Normocephalic and atraumatic.     Nose: Nose normal.     Mouth/Throat:     Mouth: Mucous membranes are moist.  Eyes:     Extraocular Movements: Extraocular movements intact.     Conjunctiva/sclera: Conjunctivae normal.     Pupils: Pupils are equal, round, and reactive to light.  Cardiovascular:     Rate and Rhythm: Normal rate.  Pulmonary:     Effort: Pulmonary effort is normal.     Breath sounds: Normal breath sounds. No wheezing.  Abdominal:     General: Abdomen is flat.     Palpations: Abdomen is soft.     Tenderness: There is no abdominal tenderness.  Musculoskeletal:        General: No swelling. Normal range of motion.     Cervical back: Neck supple.     Comments: R leg in boot  Skin:    General: Skin is warm and dry.  Neurological:     Comments: Unable to assess  Psychiatric:     Comments: Unable to assess      ED Results / Procedures / Treatments   Labs (all labs ordered are listed, but  only abnormal results are displayed) Labs Reviewed  CBC - Abnormal; Notable for the following components:      Result Value   WBC 16.2 (*)    RBC 3.36 (*)    Hemoglobin 10.6 (*)    HCT 33.2 (*)    All other components within normal limits  CULTURE, BLOOD (ROUTINE X 2)  CULTURE, BLOOD (ROUTINE X 2)  COMPREHENSIVE METABOLIC  PANEL  LACTIC ACID, PLASMA  LACTIC ACID, PLASMA  PROTIME-INR  APTT  RAPID URINE DRUG SCREEN, HOSP PERFORMED  URINALYSIS, ROUTINE W REFLEX MICROSCOPIC  ETHANOL  CK  BRAIN NATRIURETIC PEPTIDE  PROCALCITONIN  MAGNESIUM  CALCIUM, IONIZED  D-DIMER, QUANTITATIVE  I-STAT CHEM 8, ED  TROPONIN I (HIGH SENSITIVITY)    EKG EKG Interpretation  Date/Time:  Wednesday June 08 2020 15:05:02 EST Ventricular Rate:  88 PR Interval:    QRS Duration: 131 QT Interval:  402 QTC Calculation: 487 R Axis:   5 Text Interpretation: Sinus rhythm Nonspecific intraventricular conduction delay No significant change since last tracing Confirmed by Calvert Cantor [phone]) on 06/08/2020 3:06:37 PM   Radiology DG Chest Portable 1 View  Result Date: 06/08/2020 CLINICAL DATA:  Status post intubation. EXAM: PORTABLE CHEST 1 VIEW COMPARISON:  Single-view of the chest 05/10/2020. FINDINGS: New endotracheal tube is in place with the tip 2.3 cm above the carina. Defibrillator pad also noted. Mild subsegmental atelectasis is seen in the right lung base. Lungs otherwise clear. No pneumothorax or pleural fluid. IMPRESSION: ETT tip is 2.3 cm above the carina. Mild subsegmental atelectasis right lung base. Electronically Signed   By: Inge Rise M.D.   On: 06/08/2020 14:50    Procedures .Critical Care Performed by: Truddie Hidden, MD Authorized by: Truddie Hidden, MD   Critical care provider statement:    Critical care time (minutes):  45   Critical care time was exclusive of:  Separately billable procedures and treating other patients   Critical care was necessary to treat  or prevent imminent or life-threatening deterioration of the following conditions:  Respiratory failure and shock   Critical care was time spent personally by me on the following activities:  Discussions with consultants, evaluation of patient's response to treatment, examination of patient, ordering and performing treatments and interventions, ordering and review of laboratory studies, ordering and review of radiographic studies, pulse oximetry, re-evaluation of patient's condition, obtaining history from patient or surrogate and review of old charts Procedure Name: Intubation Date/Time: 06/08/2020 3:26 PM Performed by: Truddie Hidden, MD Pre-anesthesia Checklist: Patient identified, Patient being monitored and Suction available Oxygen Delivery Method: Non-rebreather mask Preoxygenation: Pre-oxygenation with 100% oxygen Induction Type: Rapid sequence Ventilation: Oral airway inserted - appropriate to patient size Laryngoscope Size: Glidescope and 3 Grade View: Grade I Tube size: 7.5 mm Number of attempts: 1 Placement Confirmation: ETT inserted through vocal cords under direct vision,  CO2 detector and Breath sounds checked- equal and bilateral Secured at: 23 cm Tube secured with: ETT holder       Medications Ordered in the ED Medications  midazolam (VERSED) 50 mg/50 mL (1 mg/mL) premix infusion (0 mg/hr Intravenous Hold 06/08/20 1441)  0.9 %  sodium chloride infusion (0 mLs Intravenous Hold 06/08/20 1502)  norepinephrine (LEVOPHED) 4mg  in 229mL premix infusion (10 mcg/min Intravenous New Bag/Given 06/08/20 1501)  rocuronium (ZEMURON) injection (70 mg Intravenous Given 06/08/20 1409)  etomidate (AMIDATE) injection (20 mg Intravenous Given 06/08/20 1409)     MDM Rules/Calculators/A&P MDM Patient's King airway swapped for ETT. ED EKG not consistent with STEMI and code Stemi cancelled. Dr. Gwenlyn Found and Dr. Marisue Ivan with Cardiology at bedside have done limited echo and found no signs of wall  motion abnormality or RV strain. Consider primary respiratory cause of her arrest. BP is low post-intubation, levophed drip started.   ED Course  I have reviewed the triage vital signs and the nursing notes.  Pertinent labs & imaging results that  were available during my care of the patient were reviewed by me and considered in my medical decision making (see chart for details).  Clinical Course as of 06/08/20 1528  Wed Jun 08, 2020  1459 CXR images and results reviewed. CBC with leukocytosis. BP improved with Levophed. Access is poor will need central line.  [CS]  8466 Spoke with ICU team who will come evaluate the patient. They will place central line.  [CS]    Clinical Course User Index [CS] Truddie Hidden, MD    Final Clinical Impression(s) / ED Diagnoses Final diagnoses:  Cardiac arrest [facility])  Acute respiratory failure with hypoxia [facility])    Rx / DC Orders ED Discharge Orders    None       Truddie Hidden, MD 06/08/20 1528

## 2020-06-08 NOTE — Consult Note (Signed)
[facility] Renal Consultation Note  Requesting MD:  Indication for Consultation: Acute kidney Injury, Hyperkalemia and Metabolic Acidosis  HPI:  [name] is a 50 y.o. female. With a significant history of hypertension, endometriosis and depression that was seen in the IR 05/10/20 for abdominal pain and was found to have Covid 19 infection   She was mildly symptomatic and was discharged home on 05/12/20. She apparently fell out of bed during the night and was found lying on the floor by family members pulseless. First responders found her to be in asystole and she was intubated and underwent chest compressions as well as EPI with ROSC. She had an R ankle fracture about 3 months ago and had a ORIF. SHe has a history of chronic pain as well as drug seeking behaviour and a history of anxiety depression and PTSD. She takes lisinopril as an outpatient for depression.  In the ER, she was seen by Dr Gwenlyn Found, Cardiology and 2 D Echo performed   She had a preserved ejection fraction and no wall motion abnormality. Her blood pressure was 58/33  Pulse was 87 and she was hypothermic at 92 degrees.   Sodium 137, K 6.5, Chloride 89, CO2 < 7  Glucose 180   BUN 214 and Cr 24.91     Alb 2.4   AST 93   ALT  26   Troponin 70  Hb 10.6  Urinalysis was cloudy and negative for protein.   CXR  Atelectasis R Lung Base  Home meds include   Fluoxitine 40 mg daily, Hydrodiuril 25 mg daily, Lamicital 200mg  BID, Lisinopril 10 mg daily, Omeprazole 20 mg Daily, Zofran 4 mg every 8 hours, Risperidone 0.5 mg BID , trazadone 150 mg daily, zanaflex 4 mg TID   Creatinine, Ser  Date/Time Value Ref Range Status  06/08/2020 03:29 PM >18.00 (H) 0.44 - 1.00 mg/dL Final  06/08/2020 02:23 PM 24.91 (H) 0.44 - 1.00 mg/dL Final  05/12/2020 12:42 AM 0.83 0.44 - 1.00 mg/dL Final  05/11/2020 01:14 AM 1.31 (H) 0.44 - 1.00 mg/dL Final  05/10/2020 05:00 PM 1.67 (H) 0.44 - 1.00 mg/dL Final  05/10/2020 04:34 AM 1.73 (H) 0.44 -  1.00 mg/dL Final  03/25/2020 01:31 PM 0.82 0.44 - 1.00 mg/dL Final  05/05/2019 12:07 PM 1.04 (H) 0.44 - 1.00 mg/dL Final  01/06/2019 04:34 PM 1.24 (H) 0.44 - 1.00 mg/dL Final  12/27/2018 05:44 AM 0.88 0.44 - 1.00 mg/dL Final  12/26/2018 08:12 AM 0.90 0.44 - 1.00 mg/dL Final  12/25/2018 03:11 PM 1.36 (H) 0.44 - 1.00 mg/dL Final  11/28/2018 01:35 AM 0.96 0.44 - 1.00 mg/dL Final  03/14/2018 11:56 PM 1.10 (H) 0.44 - 1.00 mg/dL Final  11/27/2017 02:17 AM 0.68 0.44 - 1.00 mg/dL Final  11/05/2017 05:05 AM 1.06 (H) 0.44 - 1.00 mg/dL Final  11/03/2017 01:54 PM 1.16 (H) 0.44 - 1.00 mg/dL Final  07/04/2017 01:16 AM 0.88 0.44 - 1.00 mg/dL Final  07/01/2017 09:41 AM 1.01 (H) 0.44 - 1.00 mg/dL Final  06/28/2017 03:25 AM 0.89 0.44 - 1.00 mg/dL Final  11/28/2015 02:09 PM 0.79 0.44 - 1.00 mg/dL Final  11/20/2015 05:06 AM 0.51 0.44 - 1.00 mg/dL Final  11/19/2015 08:09 AM 0.84 0.44 - 1.00 mg/dL Final  10/17/2015 01:58 AM 0.66 0.44 - 1.00 mg/dL Final  10/10/2015 03:30 AM 0.74 0.44 - 1.00 mg/dL Final  10/08/2015 04:39 AM 0.74 0.44 - 1.00 mg/dL Final  10/07/2015 07:30 PM 1.27 (H) 0.44 - 1.00 mg/dL Final  04/18/2015 06:42  PM 1.04 (H) 0.44 - 1.00 mg/dL Final  03/13/2015 10:07 AM 0.68 0.44 - 1.00 mg/dL Final  01/19/2015 01:45 AM 1.63 (H) 0.44 - 1.00 mg/dL Final  01/17/2015 07:28 AM 1.61 (H) 0.44 - 1.00 mg/dL Final  01/16/2015 04:27 AM 1.60 (H) 0.44 - 1.00 mg/dL Final  01/15/2015 07:59 AM 1.59 (H) 0.44 - 1.00 mg/dL Final  01/14/2015 12:40 AM 2.04 (H) 0.44 - 1.00 mg/dL Final  01/13/2015 03:40 AM 2.21 (H) 0.44 - 1.00 mg/dL Final    Comment:    DELTA CHECK NOTED REPEATED TO VERIFY   01/11/2015 03:45 AM 3.36 (H) 0.44 - 1.00 mg/dL Final  01/10/2015 09:57 PM 3.88 (H) 0.44 - 1.00 mg/dL Final  01/03/2015 02:05 PM 0.56 0.44 - 1.00 mg/dL Final  01/03/2015 05:49 AM 0.50 0.44 - 1.00 mg/dL Final  01/02/2015 02:10 AM 0.47 0.44 - 1.00 mg/dL Final  01/01/2015 08:25 PM 0.78 0.44 - 1.00 mg/dL Final  01/01/2015 11:41 AM  0.70 0.44 - 1.00 mg/dL Final  01/01/2015 11:28 AM 0.84 0.44 - 1.00 mg/dL Final  12/19/2014 06:27 AM 0.51 0.44 - 1.00 mg/dL Final  12/18/2014 03:50 AM 0.54 0.44 - 1.00 mg/dL Final  12/17/2014 05:26 AM 0.56 0.44 - 1.00 mg/dL Final  12/16/2014 10:55 PM 0.50 0.44 - 1.00 mg/dL Final  12/16/2014 05:36 AM 0.52 0.44 - 1.00 mg/dL Final  12/15/2014 05:13 AM 0.53 0.44 - 1.00 mg/dL Final  12/14/2014 02:51 AM 0.50 0.44 - 1.00 mg/dL Final  12/13/2014 02:55 AM 0.56 0.44 - 1.00 mg/dL Final  12/12/2014 07:40 AM 0.63 0.44 - 1.00 mg/dL Final     PMHx:   Past Medical History:  Diagnosis Date  . Anxiety   . Arthritis    "joints ache all over" (10/15/2014)  . Bulging lumbar disc   . Chronic lower back pain   . DDD (degenerative disc disease), cervical   . Depression   . Drug-seeking behavior   . Headache    "weekly" (10/15/2014)  . Hyperlipemia   . Hypertension   . PTSD (post-traumatic stress disorder)   . Skin cancer    "had them cut off my arms; don't know what kind"    Past Surgical History:  Procedure Laterality Date  . ABLATION ON ENDOMETRIOSIS  2008  . BIOPSY  12/27/2018   Procedure: BIOPSY;  Surgeon: Thornton Park, MD;  Location: [facility];  Service: Gastroenterology;;  . ESOPHAGOGASTRODUODENOSCOPY (EGD) WITH PROPOFOL N/A 12/27/2018   Procedure: ESOPHAGOGASTRODUODENOSCOPY (EGD) WITH PROPOFOL;  Surgeon: Thornton Park, MD;  Location: [facility];  Service: Gastroenterology;  Laterality: N/A;  . HEMORRHOID SURGERY  ~ 2002  . ORIF ANKLE FRACTURE Right 03/28/2020   Procedure: OPEN REDUCTION INTERNAL FIXATION (ORIF) RIGHT BIMALLEOLAR ANKLE FRACTURE;  Surgeon: Marchia Bond, MD;  Location: [facility];  Service: Orthopedics;  Laterality: Right;    Family Hx:  Family History  Problem Relation Age of Onset  . Breast cancer Mother   . Diabetes Mother   . Breast cancer Maternal Grandmother   . Breast cancer Paternal Grandmother   . Colon polyps Paternal Grandmother    . Colon cancer Neg Hx   . Esophageal cancer Neg Hx   . Rectal cancer Neg Hx   . Stomach cancer Neg Hx     Social History:  reports that she has never smoked. She has never used smokeless tobacco. She reports previous alcohol use. She reports that she does not use drugs.  Allergies:  Allergies  Allergen Reactions  . Lactose Intolerance (Gi) Nausea Only  Medications: Prior to Admission medications   Medication Sig Start Date End Date Taking? Authorizing Provider  FLUoxetine (PROZAC) 40 MG capsule Take 40 mg by mouth daily.  06/26/17   [provider]  guaiFENesin-dextromethorphan (ROBITUSSIN DM) 100-10 MG/5ML syrup Take 10 mLs by mouth every 8 (eight) hours as needed for cough. 05/12/20   Thurnell Lose, MD  hydrochlorothiazide (HYDRODIURIL) 25 MG tablet Take 1 tablet (25 mg total) by mouth at bedtime. 05/14/20   Thurnell Lose, MD  lamoTRIgine (LAMICTAL) 200 MG tablet Take 200 mg by mouth 2 (two) times daily. 04/29/20   [provider]  lisinopril (ZESTRIL) 10 MG tablet Take 1 tablet (10 mg total) by mouth daily. 05/15/20   Thurnell Lose, MD  omeprazole (PRILOSEC) 20 MG capsule Take 1 capsule (20 mg total) by mouth daily. 05/12/20   Thurnell Lose, MD  ondansetron (ZOFRAN) 4 MG tablet Take 1 tablet (4 mg total) by mouth every 8 (eight) hours as needed for nausea or vomiting. 05/12/20   Thurnell Lose, MD  risperiDONE (RISPERDAL) 0.5 MG tablet Take 0.5 mg by mouth 2 (two) times daily. 03/03/18   [provider]  tiZANidine (ZANAFLEX) 4 MG tablet Take 8 mg by mouth 3 (three) times daily.    [provider]  traZODone (DESYREL) 150 MG tablet Take 150 mg by mouth at bedtime.  06/03/17   [provider]      Labs:  Results for orders placed or performed during the hospital encounter of 06/08/20 (from the past 48 hour(s))  CBC     Status: Abnormal   Collection Time: 06/08/20  2:23 PM  Result Value Ref Range   WBC 16.2 (H) 4.0 - 10.5  K/uL   RBC 3.36 (L) 3.87 - 5.11 MIL/uL   Hemoglobin 10.6 (L) 12.0 - 15.0 g/dL   HCT 33.2 (L) 36.0 - 46.0 %   MCV 98.8 80.0 - 100.0 fL   MCH 31.5 26.0 - 34.0 pg   MCHC 31.9 30.0 - 36.0 g/dL   RDW 13.6 11.5 - 15.5 %   Platelets 276 150 - 400 K/uL   nRBC 0.0 0.0 - 0.2 %    Comment: Performed at [facility], [place] [address] [place], [place] [address]  Comprehensive metabolic panel     Status: Abnormal   Collection Time: 06/08/20  2:23 PM  Result Value Ref Range   Sodium 137 135 - 145 mmol/L   Potassium 6.5 (HH) 3.5 - 5.1 mmol/L    Comment: CRITICAL RESULT CALLED TO, READ BACK BY AND VERIFIED WITH: C.STRAUGHN,RN 1536 06/08/20 CLARK,S    Chloride 89 (L) 98 - 111 mmol/L   CO2 <7 (L) 22 - 32 mmol/L   Glucose, Bld 180 (H) 70 - 99 mg/dL    Comment: Glucose reference range applies only to samples taken after fasting for at least 8 hours.   BUN 214 (H) 6 - 20 mg/dL   Creatinine, Ser 24.91 (H) 0.44 - 1.00 mg/dL   Calcium 7.7 (L) 8.9 - 10.3 mg/dL   Total Protein 6.6 6.5 - 8.1 g/dL   Albumin 2.4 (L) 3.5 - 5.0 g/dL   AST 93 (H) 15 - 41 U/L   ALT 26 0 - 44 U/L   Alkaline Phosphatase 109 38 - 126 U/L   Total Bilirubin 0.9 0.3 - 1.2 mg/dL   GFR, Estimated 1 (L) >60 mL/min    Comment: (NOTE) Calculated using the CKD-EPI Creatinine Equation (2021)  Anion gap NOT CALCULATED 5 - 15    Comment: Performed at [facility] [address] [place], [place] [address]  Troponin I (High Sensitivity)     Status: Abnormal   Collection Time: 06/08/20  2:23 PM  Result Value Ref Range   Troponin I (High Sensitivity) 70 (H) <18 ng/L    Comment: (NOTE) Elevated high sensitivity troponin I (hsTnI) values and significant  changes across serial measurements may suggest ACS but many other  chronic and acute conditions are known to elevate hsTnI results.  Refer to the Links section for chest pain algorithms and additional  guidance. Performed at [facility], [place] [address]  [place], [place] [address]   Urinalysis, Routine w reflex microscopic Urine, Clean Catch     Status: Abnormal   Collection Time: 06/08/20  3:12 PM  Result Value Ref Range   Color, Urine AMBER (A) YELLOW    Comment: BIOCHEMICALS MAY BE AFFECTED BY COLOR   APPearance CLOUDY (A) CLEAR   Specific Gravity, Urine 1.023 1.005 - 1.030   pH 5.0 5.0 - 8.0   Glucose, UA NEGATIVE NEGATIVE mg/dL   Hgb urine dipstick NEGATIVE NEGATIVE   Bilirubin Urine NEGATIVE NEGATIVE   Ketones, ur NEGATIVE NEGATIVE mg/dL   Protein, ur NEGATIVE NEGATIVE mg/dL   Nitrite NEGATIVE NEGATIVE   Leukocytes,Ua NEGATIVE NEGATIVE    Comment: Performed at [facility] [address] [place], [place] [address]  I-stat chem 8, ED (not at [facility] or [facility])     Status: Abnormal   Collection Time: 06/08/20  3:29 PM  Result Value Ref Range   Sodium 131 (L) 135 - 145 mmol/L   Potassium 6.1 (H) 3.5 - 5.1 mmol/L   Chloride 96 (L) 98 - 111 mmol/L   BUN >130 (H) 6 - 20 mg/dL   Creatinine, Ser >18.00 (H) 0.44 - 1.00 mg/dL   Glucose, Bld 163 (H) 70 - 99 mg/dL    Comment: Glucose reference range applies only to samples taken after fasting for at least 8 hours.   Calcium, Ion 0.77 (LL) 1.15 - 1.40 mmol/L   TCO2 8 (L) 22 - 32 mmol/L   Hemoglobin 10.9 (L) 12.0 - 15.0 g/dL   HCT 32.0 (L) 36.0 - 46.0 %   Comment NOTIFIED PHYSICIAN      ROS:  Unobtainable    Physical Exam: Vitals:   06/08/20 1543 06/08/20 1544  BP:    Pulse: 97 96  Resp: (!) 24 (!) 24  Temp: (!) 92.9 F (33.8 C) (!) 92.9 F (33.8 C)  SpO2: 100% 100%     General: intubated and not responsive HEENT: mucus membranes moist  Atraumatic   ET tube  Eyes: EOMI   Round and reactive Neck: supple  JVP not elevated Heart: Regular  Rate and rhythm Lungs:  Intubated  Supported breath sounds Abdomen: hypoactive  Non distended Extremities: R leg in boot  No edema Skin: clammy and cool  No abrasions or lesions noted Neuro: unable to assess  Assessment/Plan:    1.Renal- Acute kidney Injury  Negative for blood and protein. Normal renal function when admitted 10/22/61   Certainly appears to have had some acute injury. Doubt obstruction but will check renal ultrasound. No signs of acute GN or AIN on initial urinalysis. Platelets appear to be ok and doubt this represents a TMA although still needs to be considered. She was also taking an ACE inhibitor as far as we can tell. This may  be rhabdomyolysis although urine dipstick has no blood. A CPK has been sent. She will need renal replacement therapy and CRRT will be started emergently 06/08/20 2. Hypertension/volume  -  Shock hypotension. Considering cardiogenic and septic   Antibiotics and cultures per primary Service. Pressor support to maintain MAP 3. VDRF - recent mild covid   CXR  Non revealing   As per critical care 4. Unresponsiveness  CT head and drug screen and airway management per CCM. 5. Metabolic Acidosis   IV bicarbonate and IV bicarbonate in replacement fluid 6. Hyperkalemia   No additional potassium through IV Replacement fluid or dialysis    Will need to follow closely and may drop with rewarming, pressors and correction of her acidosis   Sherril Croon 06/08/2020, 4:00 PM

## 2020-06-08 NOTE — ED Notes (Signed)
Levo increased to 26mcg/min per verbal order CCM.

## 2020-06-08 NOTE — ED Notes (Signed)
2 RN's and 1 MD attempted to place OG tube without success

## 2020-06-08 NOTE — Procedures (Signed)
Central Venous Catheter Insertion Procedure Note  [name]  [id number]  12-May-1970  Date:06/08/20  Time:4:53 PM   Provider Performing:Stephanie Jerilynn Mages Ayesha Rumpf   Procedure: Insertion of Non-tunneled Central Venous Catheter(36556) with US guidance (15615)   Indication(s) Medication administration and Difficult access  Consent Unable to obtain consent due to emergent nature of procedure.  Anesthesia Topical only with 1% lidocaine   Timeout Verified patient identification, verified procedure, site/side was marked, verified correct patient position, special equipment/implants available, medications/allergies/relevant history reviewed, required imaging and test results available.  Sterile Technique Maximal sterile technique including full sterile barrier drape, hand hygiene, sterile gown, sterile gloves, mask, hair covering, sterile ultrasound probe cover (if used).      Procedure Description Area of catheter insertion was cleaned with chlorhexidine and draped in sterile fashion.  With real-time ultrasound guidance a central venous catheter was placed into the left internal jugular vein. Nonpulsatile blood flow and easy flushing noted in all ports.  The catheter was sutured in place and BioPatch/sterile dressing applied.  Complications/Tolerance None; patient tolerated the procedure well. Chest X-ray is ordered to verify placement for internal jugular or subclavian cannulation.   Chest x-ray is not ordered for femoral cannulation.  EBL Minimal  Specimen(s) None  Procedure was supervised/proctored by Georgann Housekeeper, NP.  Lestine Mount, PA-C [facility] 06/08/20 4:55 PM  Please see [email or web address] for pager details.

## 2020-06-08 NOTE — ED Triage Notes (Signed)
Pt here post arrest found in there floor , had been there since sometime  in the middle of the night upon ems arrival pt was gasping for air , lost pulses asystole on monitor , pt received 1 epi and 3 mins of cpr , pulses on arrival with a king airway

## 2020-06-08 NOTE — ED Notes (Signed)
New bag of Levo hung at 82mcqs. Not scanned.

## 2020-06-08 NOTE — Progress Notes (Signed)
eLink Physician-Brief Progress Note Patient Name: [name] DOB: Oct 10, 1970 MRN: [id number]   Date of Service  06/08/2020  HPI/Events of Note  Patient needs restraints for safety while on the ventilator.  eICU Interventions  Bilateral wrist restraints ordered.        Kerry Kass Gola Bribiesca 06/08/2020, 11:17 PM

## 2020-06-08 NOTE — Progress Notes (Signed)
CRITICAL VALUE ALERT  Critical Value:  Urine Serum Osm 359, Lact 4.4, Trop 92  Date & Time Notied:  06/08/20 9:13 PM   Provider Notified: Warren Lacy  Orders Received/Actions taken: awaiting / see orders

## 2020-06-08 NOTE — Procedures (Signed)
Arterial Catheter Insertion Procedure Note  [name]  [id number]  [birth year]  Date:06/08/20  Time:4:55 PM    Provider Performing: Corey Harold    Procedure: Insertion of Arterial Line [phone]) with US guidance (66440)   Indication(s) Blood pressure monitoring and/or need for frequent ABGs  Consent Unable to obtain consent due to emergent nature of procedure.  Anesthesia None   Time Out Verified patient identification, verified procedure, site/side was marked, verified correct patient position, special equipment/implants available, medications/allergies/relevant history reviewed, required imaging and test results available.   Sterile Technique Maximal sterile technique including full sterile barrier drape, hand hygiene, sterile gown, sterile gloves, mask, hair covering, sterile ultrasound probe cover (if used).   Procedure Description Area of catheter insertion was cleaned with chlorhexidine and draped in sterile fashion. With real-time ultrasound guidance an arterial catheter was placed into the right radial artery.  Appropriate arterial tracings confirmed on monitor.     Complications/Tolerance None; patient tolerated the procedure well.   EBL Minimal   Specimen(s) None   Georgann Housekeeper, AGACNP-BC [facility]  See Amion for personal pager PCCM on call pager [phone] until 7pm. Please call Elink 7p-7a. [phone]  06/08/2020 4:56 PM

## 2020-06-08 NOTE — Op Note (Signed)
Central Venous Catheter Insertion Procedure Note  [name]  [id number]  May 08, 1970  Date:06/08/20  Time:8:36 PM   Provider Performing:Jordan Caraveo R Senica Crall   Procedure: Insertion of Non-tunneled Central Venous Catheter(36556)with US guidance (07680)    Indication(s) Hemodialysis  Consent Unable to obtain consent due to emergent nature of procedure.  Anesthesia Topical only with 1% lidocaine   Timeout Verified patient identification, verified procedure, site/side was marked, verified correct patient position, special equipment/implants available, medications/allergies/relevant history reviewed, required imaging and test results available.  Sterile Technique Maximal sterile technique including full sterile barrier drape, hand hygiene, sterile gown, sterile gloves, mask, hair covering, sterile ultrasound probe cover (if used).  Procedure Description Area of catheter insertion was cleaned with chlorhexidine and draped in sterile fashion.   With real-time ultrasound guidance a HD catheter   was placed into the right internal jugular vein.  Nonpulsatile blood flow and easy flushing noted in all ports.  The catheter was sutured in place and sterile dressing applied.  Complications/Tolerance None; patient tolerated the procedure well. Chest X-ray is ordered to verify placement for internal jugular    EBL Minimal  Specimen(s) None

## 2020-06-08 NOTE — Progress Notes (Signed)
MD notified of Arterial line blood pressure of 63/37 with MAP of 41. Per Dr. Halford Chessman with PCCM, wait on transport until second dose of pressors and liter bolus is started and blood pressure has stabilized. RN notified.

## 2020-06-08 NOTE — ED Notes (Signed)
1 amp bicarb IV given, verbal order CCM.

## 2020-06-09 ENCOUNTER — Inpatient Hospital Stay (HOSPITAL_COMMUNITY): Payer: Medicare Other

## 2020-06-09 DIAGNOSIS — J690 Pneumonitis due to inhalation of food and vomit: Secondary | ICD-10-CM

## 2020-06-09 DIAGNOSIS — J9601 Acute respiratory failure with hypoxia: Secondary | ICD-10-CM | POA: Diagnosis not present

## 2020-06-09 DIAGNOSIS — I469 Cardiac arrest, cause unspecified: Secondary | ICD-10-CM

## 2020-06-09 DIAGNOSIS — N179 Acute kidney failure, unspecified: Secondary | ICD-10-CM | POA: Diagnosis not present

## 2020-06-09 LAB — CBC
HCT: 32 % — ABNORMAL LOW (ref 36.0–46.0)
Hemoglobin: 11.2 g/dL — ABNORMAL LOW (ref 12.0–15.0)
MCH: 30.4 pg (ref 26.0–34.0)
MCHC: 35 g/dL (ref 30.0–36.0)
MCV: 87 fL (ref 80.0–100.0)
Platelets: 224 K/uL (ref 150–400)
RBC: 3.68 MIL/uL — ABNORMAL LOW (ref 3.87–5.11)
RDW: 12.7 % (ref 11.5–15.5)
WBC: 14 K/uL — ABNORMAL HIGH (ref 4.0–10.5)
nRBC: 0 % (ref 0.0–0.2)

## 2020-06-09 LAB — ECHOCARDIOGRAM COMPLETE
Area-P 1/2: 3.08 cm2
Height: 66 in
P 1/2 time: 325 msec
S' Lateral: 3.2 cm
Weight: 3104.08 oz

## 2020-06-09 LAB — POCT I-STAT 7, (LYTES, BLD GAS, ICA,H+H)
Acid-Base Excess: 2 mmol/L (ref 0.0–2.0)
Bicarbonate: 25.8 mmol/L (ref 20.0–28.0)
Calcium, Ion: 0.85 mmol/L — CL (ref 1.15–1.40)
HCT: 33 % — ABNORMAL LOW (ref 36.0–46.0)
Hemoglobin: 11.2 g/dL — ABNORMAL LOW (ref 12.0–15.0)
O2 Saturation: 99 %
Patient temperature: 36.63
Potassium: 3.8 mmol/L (ref 3.5–5.1)
Sodium: 136 mmol/L (ref 135–145)
TCO2: 27 mmol/L (ref 22–32)
pCO2 arterial: 34.5 mmHg (ref 32.0–48.0)
pH, Arterial: 7.48 — ABNORMAL HIGH (ref 7.350–7.450)
pO2, Arterial: 116 mmHg — ABNORMAL HIGH (ref 83.0–108.0)

## 2020-06-09 LAB — MRSA PCR SCREENING: MRSA by PCR: POSITIVE — AB

## 2020-06-09 LAB — PHOSPHORUS: Phosphorus: 5.1 mg/dL — ABNORMAL HIGH (ref 2.5–4.6)

## 2020-06-09 LAB — RENAL FUNCTION PANEL
Albumin: 2.4 g/dL — ABNORMAL LOW (ref 3.5–5.0)
Anion gap: 16 — ABNORMAL HIGH (ref 5–15)
BUN: 39 mg/dL — ABNORMAL HIGH (ref 6–20)
CO2: 19 mmol/L — ABNORMAL LOW (ref 22–32)
Calcium: 7.8 mg/dL — ABNORMAL LOW (ref 8.9–10.3)
Chloride: 100 mmol/L (ref 98–111)
Creatinine, Ser: 3.76 mg/dL — ABNORMAL HIGH (ref 0.44–1.00)
GFR, Estimated: 14 mL/min — ABNORMAL LOW (ref >60)
Glucose, Bld: 107 mg/dL — ABNORMAL HIGH (ref 70–99)
Phosphorus: 2.9 mg/dL (ref 2.5–4.6)
Potassium: 4.2 mmol/L (ref 3.5–5.1)
Sodium: 135 mmol/L (ref 135–145)

## 2020-06-09 LAB — GLUCOSE, CAPILLARY
Glucose-Capillary: 112 mg/dL — ABNORMAL HIGH (ref 70–99)
Glucose-Capillary: 134 mg/dL — ABNORMAL HIGH (ref 70–99)
Glucose-Capillary: 67 mg/dL — ABNORMAL LOW (ref 70–99)
Glucose-Capillary: 78 mg/dL (ref 70–99)
Glucose-Capillary: 82 mg/dL (ref 70–99)
Glucose-Capillary: 98 mg/dL (ref 70–99)
Glucose-Capillary: 99 mg/dL (ref 70–99)

## 2020-06-09 LAB — CALCIUM, IONIZED: Calcium, Ionized, Serum: 3.1 mg/dL — ABNORMAL LOW (ref 4.5–5.6)

## 2020-06-09 LAB — PROCALCITONIN: Procalcitonin: 17.19 ng/mL

## 2020-06-09 LAB — MAGNESIUM: Magnesium: 2.1 mg/dL (ref 1.7–2.4)

## 2020-06-09 MED ORDER — DEXTROSE-NACL 5-0.9 % IV SOLN: INTRAVENOUS | Status: DC

## 2020-06-09 MED ORDER — HEPARIN SODIUM (PORCINE) 1000 UNIT/ML DIALYSIS
1000.0000 [IU] | INTRAMUSCULAR | Status: DC | PRN
  Administered 2020-06-13: 3000 [IU] via INTRAVENOUS_CENTRAL
  Filled 2020-06-09: qty 6
  Filled 2020-06-09: qty 3
  Filled 2020-06-09 (×2): qty 6

## 2020-06-09 MED ORDER — PRISMASOL BGK 4/2.5 32-4-2.5 MEQ/L REPLACEMENT SOLN
Status: DC
  Filled 2020-06-09 (×14): qty 5000

## 2020-06-09 MED ORDER — MIDAZOLAM 50MG/50ML (1MG/ML) PREMIX INFUSION
0.0000 mg/h | INTRAVENOUS | Status: DC
  Administered 2020-06-09: 7 mg/h via INTRAVENOUS
  Administered 2020-06-09: 2 mg/h via INTRAVENOUS
  Administered 2020-06-09: 8 mg/h via INTRAVENOUS
  Administered 2020-06-10: 5 mg/h via INTRAVENOUS
  Administered 2020-06-10: 8 mg/h via INTRAVENOUS
  Administered 2020-06-11 – 2020-06-12 (×2): 5 mg/h via INTRAVENOUS
  Filled 2020-06-09 (×7): qty 50

## 2020-06-09 MED ORDER — MUPIROCIN 2 % EX OINT
1.0000 "application " | TOPICAL_OINTMENT | Freq: Two times a day (BID) | CUTANEOUS | Status: AC
  Administered 2020-06-09 – 2020-06-13 (×10): 1 via NASAL
  Filled 2020-06-09: qty 22

## 2020-06-09 MED ORDER — MIDAZOLAM BOLUS VIA INFUSION
1.0000 mg | INTRAVENOUS | Status: DC | PRN
  Administered 2020-06-09: 1 mg via INTRAVENOUS
  Filled 2020-06-09: qty 2

## 2020-06-09 MED ORDER — PRISMASOL BGK 4/2.5 32-4-2.5 MEQ/L REPLACEMENT SOLN
Status: DC
  Filled 2020-06-09 (×7): qty 5000

## 2020-06-09 MED ORDER — DEXTROSE 50 % IV SOLN
12.5000 g | INTRAVENOUS | Status: AC
  Administered 2020-06-09: 12.5 g via INTRAVENOUS

## 2020-06-09 MED ORDER — POTASSIUM CHLORIDE 10 MEQ/50ML IV SOLN
10.0000 meq | INTRAVENOUS | Status: AC
  Administered 2020-06-09 (×2): 10 meq via INTRAVENOUS
  Filled 2020-06-09 (×2): qty 50

## 2020-06-09 MED ORDER — PRISMASOL BGK 4/2.5 32-4-2.5 MEQ/L EC SOLN
Status: DC
  Filled 2020-06-09 (×39): qty 5000

## 2020-06-09 MED ORDER — POTASSIUM CHLORIDE CRYS ER 10 MEQ PO TBCR: 40.0000 meq | EXTENDED_RELEASE_TABLET | ORAL | Status: DC

## 2020-06-09 MED ORDER — FENTANYL CITRATE (PF) 100 MCG/2ML IJ SOLN
50.0000 ug | INTRAMUSCULAR | Status: DC | PRN
  Administered 2020-06-09: 200 ug via INTRAVENOUS
  Administered 2020-06-10 – 2020-06-12 (×6): 100 ug via INTRAVENOUS
  Filled 2020-06-09: qty 2
  Filled 2020-06-09: qty 4
  Filled 2020-06-09 (×5): qty 2

## 2020-06-09 MED ORDER — HEPARIN SODIUM (PORCINE) 5000 UNIT/ML IJ SOLN
5000.0000 [IU] | Freq: Three times a day (TID) | INTRAMUSCULAR | Status: DC
  Administered 2020-06-09 – 2020-06-14 (×17): 5000 [IU] via SUBCUTANEOUS
  Filled 2020-06-09 (×17): qty 1

## 2020-06-09 MED ORDER — SODIUM CHLORIDE 0.9 % FOR CRRT: INTRAVENOUS_CENTRAL | Status: DC | PRN

## 2020-06-09 MED ORDER — DEXTROSE 50 % IV SOLN
INTRAVENOUS | Status: AC
  Filled 2020-06-09: qty 50

## 2020-06-09 NOTE — Progress Notes (Signed)
NAME:  [name], MRN:  [id number], DOB:  Apr 02, 1971, LOS: 1 ADMISSION DATE:  06/08/2020, CONSULTATION DATE:  06/08/2020 REFERRING MD:  Dr. Karle Starch, ER, CHIEF COMPLAINT:  Cardiac arrest   Brief History:  50 yo female with hx of opiate use, PTSD, and depression was laying on the floor for several hours.  Found by EMS to have agonal respiratory pattern and then asystole.  Had [facility] airway placed and received epinephrine with 3 minutes of CPR before ROSC achieved.  In ER found to have severe acidosis, hypokalemia, acute renal failure, hypothermia, and shock.  UDS positive for amphetamines.  Past Medical History:  PTSD, Depression, HTN, HLD, Headache, Opiate dependence, Back pain, Anxiety, Osteoarthritis, COVID 19 infection January 2022  Significant Hospital Events:  2/16 admit, start CRRT  Consults:  Nephrology  Procedures:  ETT 2/16 >> Arterial line 2/16 >> HD cath 2/16 >> CVL 2/16 >>   Significant Diagnostic Tests:   CT head 2/16 >> normal brain   Renal u/s 2/16 >> unremarkable  Echo 2/17 >>  Micro Data:  Blood 2/16 >> Sputum 2/16 >> MRSA PCR 2/16 >> positive  Antimicrobials:  Zosyn 2/16 >>    Interim History / Subjective:  Remains on pressors, CRRT, vent support.  Started on ketamine overnight.  Objective   Blood pressure (!) 89/48, pulse 95, temperature (!) 94.5 F (34.7 C), resp. rate (!) 24, height 5\' 6"  (1.676 m), weight 88 kg, last menstrual period 06/03/2018, SpO2 99 %.    Vent Mode: PRVC FiO2 (%):  [30 %-100 %] 30 % Set Rate:  [18 bmp-28 bmp] 26 bmp Vt Set:  [470 mL] 470 mL PEEP:  [5 cmH20] 5 cmH20 Plateau Pressure:  [16 cmH20-22 cmH20] 22 cmH20   Intake/Output Summary (Last 24 hours) at 06/09/2020 0754 Last data filed at 06/09/2020 0700 Gross per 24 hour  Intake 2659.75 ml  Output 696 ml  Net 1963.75 ml   Filed Weights   06/08/20 2052 06/09/20 0428  Weight: 88 kg 88 kg    Examination:  General - sedated Eyes - pupils reactive ENT - ETT  in place Cardiac - regular rate/rhythm, no murmur Chest - scattered rhonchi Abdomen - soft, non tender, + bowel sounds Extremities - improved appearance of feet with only mild duskiness of toes Skin - no rashes Neuro - RASS -3  Resolved Hospital Problem list   Anion gap metabolic acidosis with lactic acidosis, Hyperkalemia with ECG changes  Assessment & Plan:   Cardiac arrest with asystole with circulatory shock. Hx of HTN. - likely from opiate/amphetamine overdose - pressors to keep MAP > 65 - f/u Echo - hold outpt HCTZ, lisinopril  Acute metabolic encephalopathy 2nd to hypoxia, renal failure, sepsis. Hx of depression, PTSD, anxiety, chronic opiate use. - improved mental status on 2/17 - requiring sedation while on ventilatory - hypotensive with fentanyl pushes - tolerates versed better; start versed gtt with prn fentanyl - hold outpt prozac, lamictal, risperdal, zanaflex, trazodone  Septic shock likely from aspiration pneumonitis. - f/u sputum, blood culture - day 2 of zosyn  AKI from ATN in setting of shock and hypoxia. Hypocalcemia. - creatinine 0.83 from 05/12/20 - nephrology consulted - continue CRRT - f/u BMET, ABG  Hypothermia. - external warming blanket while on CRRT  Acute hypoxic respiratory failure. - full vent support - f/u CXR, ABG  Hyperglycemia. - HbA1C 6.1 from 2/16 - SSI   Best practice (evaluated daily)  Diet: NPO; unable to place NG/OG tube; will plan for  cortrak on 2/18 if she is not able to be extubated sooner DVT prophylaxis: SQ heparin GI prophylaxis: protonix Mobility: bed rest Disposition: ICU  Goals of Care:  Last date of multidisciplinary goals of care discussion: pending Family and staff present: pending Summary of discussion: pending Follow up goals of care discussion due: pending Code Status: ICU  Labs    CMP Latest Ref Rng & Units 06/09/2020 06/09/2020 06/08/2020  Glucose 70 - 99 mg/dL - 73 -  BUN 6 - 20 mg/dL - 100(H) -   Creatinine 0.44 - 1.00 mg/dL - 10.14(H) -  Sodium 135 - 145 mmol/L 136 138 134(L)  Potassium 3.5 - 5.1 mmol/L 3.8 4.0 4.6  Chloride 98 - 111 mmol/L - 95(L) -  CO2 22 - 32 mmol/L - 21(L) -  Calcium 8.9 - 10.3 mg/dL - 7.4(L) -  Total Protein 6.5 - 8.1 g/dL - 6.3(L) -  Total Bilirubin 0.3 - 1.2 mg/dL - 1.1 -  Alkaline Phos 38 - 126 U/L - 107 -  AST 15 - 41 U/L - 172(H) -  ALT 0 - 44 U/L - 41 -    CBC Latest Ref Rng & Units 06/09/2020 06/08/2020 06/08/2020  WBC 4.0 - 10.5 K/uL - - -  Hemoglobin 12.0 - 15.0 g/dL 11.2(L) 11.6(L) 11.6(L)  Hematocrit 36.0 - 46.0 % 33.0(L) 34.0(L) 32.3(L)  Platelets 150 - 400 K/uL - - -    ABG    Component Value Date/Time   PHART 7.480 (H) 06/09/2020 0446   PCO2ART 34.5 06/09/2020 0446   PO2ART 116 (H) 06/09/2020 0446   HCO3 25.8 06/09/2020 0446   TCO2 27 06/09/2020 0446   ACIDBASEDEF 10.0 (H) 06/08/2020 2238   O2SAT 99.0 06/09/2020 0446    CBG (last 3)  Recent Labs    06/08/20 2035 06/08/20 2344 06/09/20 0347  GLUCAP 227* 181* 78     Critical care time: 36 minutes  Chesley Mires, MD [place] Pager - [phone] 06/09/2020, 7:54 AM

## 2020-06-09 NOTE — Progress Notes (Signed)
  Echocardiogram 2D Echocardiogram has been performed.  Jennette Dubin 06/09/2020, 10:13 AM

## 2020-06-09 NOTE — Progress Notes (Signed)
90 cc of Ketamine wasted with Forestine Na in sink.

## 2020-06-09 NOTE — Progress Notes (Signed)
[name] ROUNDING NOTE   Subjective:   Interval History: 50 y.o. female. With a significant history of hypertension, endometriosis and depression that was seen in the IR 05/10/20 for abdominal pain and was found to have Covid 19 infection   She was mildly symptomatic and was discharged home on 05/12/20. She apparently fell out of bed during the night and was found lying on the floor by family members pulseless. First responders found her to be in asystole and she was intubated and underwent chest compressions as well as EPI with ROSC. She had an R ankle fracture about 3 months ago and had a ORIF. SHe has a history of chronic pain as well as drug seeking behaviour and a history of anxiety depression and PTSD. She takes lisinopril as an outpatient for depression.  In the ER, she was seen by Dr Gwenlyn Found, Cardiology and 2 D Echo performed   She had a preserved ejection fraction and no wall motion abnormality. Her blood pressure was 58/33  Pulse was 87 and she was hypothermic at 92 degrees.  Etiology of acute kidney injury appears to be acute tubular necrosis from prolonged ischemia. CPK was elevated at 5564 I be curious about an element of rhabdomyolysis. Urine studies are bland and renal ultrasound unremarkable. CRRT was initiated 06/08/2020  Blood pressure 117/73 pulse 95 temperature 94.5 O2 sats 100% FiO2 30%. Oliguric with only 15 cc recorded 06/09/2020  Sodium 136 potassium 3.8 chloride 95 CO2 21 glucose 73 BUN 100 creatinine 10 calcium 7.4 phosphorus 5.1 magnesium 2.1 albumin 2.7 hemoglobin 11.2  Objective:  Vital signs in last 24 hours:  Temp:  [92.4 F (33.6 C)-94.5 F (34.7 C)] 94.5 F (34.7 C) (02/16 2200) Pulse Rate:  [16-205] 205 (02/17 0900) Resp:  [14-42] 21 (02/17 0900) BP: (43-132)/(27-93) 103/93 (02/17 0900) SpO2:  [89 %-100 %] 100 % (02/17 0900) Arterial Line BP: (77-144)/(55-79) 115/70 (02/17 0900) FiO2 (%):  [30 %-100 %] 30 % (02/17 0806) Weight:  [88 kg] 88 kg  (02/17 0428)  Weight change:  Filed Weights   06/08/20 2052 06/09/20 0428  Weight: 88 kg 88 kg    Intake/Output: I/O last 3 completed shifts: In: 2659.8 [I.V.:2359.1; IV Piggyback:300.7] Out: 696 [Other:696]   Intake/Output this shift:  Total I/O In: 176.8 [I.V.:176.8] Out: 112 [Urine:15; Other:97]  Intubated and sedated CVS-regular rate and rhythm RS-mechanically supported breath sounds ABD- BS present soft non-distended EXT- no edema right leg in boot   Basic Metabolic Panel: Recent Labs  Lab 06/08/20 1423 06/08/20 1529 06/08/20 1754 06/08/20 1847 06/08/20 1944 06/08/20 2238 06/09/20 0440 06/09/20 0446  NA 137 131* 134*  --  135 134* 138 136  K 6.5* 6.1* 4.1  --  4.0 4.6 4.0 3.8  CL 89* 96*  --   --  86*  --  95*  --   CO2 <7*  --   --   --  11*  --  21*  --   GLUCOSE 180* 163*  --   --  235*  --  73  --   BUN 214* >130*  --   --  204*  --  100*  --   CREATININE 24.91* >18.00*  --   --  22.55*  --  10.14*  --   CALCIUM 7.7*  --   --   --  6.5*  --  7.4*  --   MG  --   --   --  2.7*  --   --  2.1  --   PHOS  --   --   --   --  >30.0*  --  5.1*  --     Liver Function Tests: Recent Labs  Lab 06/08/20 1423 06/08/20 1944 06/09/20 0440  AST 93*  --  172*  ALT 26  --  41  ALKPHOS 109  --  107  BILITOT 0.9  --  1.1  PROT 6.6  --  6.3*  ALBUMIN 2.4* 2.3* 2.7*   No results for input(s): LIPASE, AMYLASE in the last 168 hours. No results for input(s): AMMONIA in the last 168 hours.  CBC: Recent Labs  Lab 06/08/20 1423 06/08/20 1529 06/08/20 1754 06/08/20 2233 06/08/20 2238 06/09/20 0446  WBC 16.2*  --   --   --   --   --   HGB 10.6* 10.9* 10.9* 11.6* 11.6* 11.2*  HCT 33.2* 32.0* 32.0* 32.3* 34.0* 33.0*  MCV 98.8  --   --   --   --   --   PLT 276  --   --   --   --   --     Cardiac Enzymes: Recent Labs  Lab 06/08/20 1847  CKTOTAL 5,564*    BNP: Invalid input(s): POCBNP  CBG: Recent Labs  Lab 06/08/20 2035 06/08/20 2344 06/09/20 0347  06/09/20 0800 06/09/20 0833  GLUCAP 227* 181* 78 63* 134*    Microbiology: Results for orders placed or performed during the hospital encounter of 06/08/20  MRSA PCR Screening     Status: Abnormal   Collection Time: 06/08/20  8:44 PM   Specimen: Nasopharyngeal  Result Value Ref Range Status   MRSA by PCR POSITIVE (A) NEGATIVE Final    Comment:        The GeneXpert MRSA Assay (FDA approved for NASAL specimens only), is one component of a comprehensive MRSA colonization surveillance program. It is not intended to diagnose MRSA infection nor to guide or monitor treatment for MRSA infections. RESULT CALLED TO, READ BACK BY AND VERIFIED WITH: Ferrel Logan 06/08/20 2359 JDW Performed at [facility], [address], [place], [place] [address]   Blood culture (routine x 2)     Status: None (Preliminary result)   Collection Time: 06/08/20  9:25 PM   Specimen: BLOOD  Result Value Ref Range Status   Specimen Description BLOOD HEMODIALYSIS CATHETER  Final   Special Requests   Final    BOTTLES DRAWN AEROBIC AND ANAEROBIC Blood Culture results may not be optimal due to an excessive volume of blood received in culture bottles   Culture   Final    NO GROWTH < 12 HOURS Performed at [facility], [place] [address] [place], [place] [address]    Report Status PENDING  Incomplete    Coagulation Studies: Recent Labs    06/08/20 1847  LABPROT 19.9*  INR 1.8*    Urinalysis: Recent Labs    06/08/20 1512  COLORURINE AMBER*  LABSPEC 1.023  PHURINE 5.0  GLUCOSEU NEGATIVE  HGBUR NEGATIVE  BILIRUBINUR NEGATIVE  KETONESUR NEGATIVE  PROTEINUR NEGATIVE  NITRITE NEGATIVE  LEUKOCYTESUR NEGATIVE      Imaging: CT Head Wo Contrast  Result Date: 06/08/2020 CLINICAL DATA:  Found on the floor. EXAM: CT HEAD WITHOUT CONTRAST TECHNIQUE: Contiguous axial images were obtained from the base of the skull through the vertex without intravenous contrast. COMPARISON:  03/21/2020 FINDINGS:  Brain: The brain has a normal appearance without evidence of malformation, atrophy, old or acute small  or large vessel infarction, mass lesion, hemorrhage, hydrocephalus or extra-axial collection. Vascular: Major vessels at the base of the brain show flow. Venous sinuses appear patent. Skull and upper cervical spine: Normal. Sinuses/Orbits: Small amount of layering fluid in the right maxillary sinus. Other: None significant. IMPRESSION: 1. Normal appearance of the brain itself. 2. Small amount of layering fluid in the right maxillary sinus. Electronically Signed   By: Mark  Shogry M.D.   On: 06/08/2020 18:29   US RENAL  Result Date: 06/08/2020 CLINICAL DATA:  Acute renal insufficiency EXAM: RENAL / URINARY TRACT ULTRASOUND COMPLETE COMPARISON:  05/10/2020 FINDINGS: Right Kidney: Renal measurements: 8.5 x 4.7 x 4.2 cm = volume: 86.8 mL. Echogenicity within normal limits. No mass or hydronephrosis visualized. Left Kidney: Renal measurements: 10.3 x 4.5 x 4.2 cm = volume: 102.3 mL. Echogenicity within normal limits. No mass or hydronephrosis visualized. Bladder: Bladder is decompressed, limiting its evaluation. Other: None. IMPRESSION: Unremarkable renal ultrasound. Electronically Signed   By: Michael  Brown M.D.   On: 06/08/2020 22:42   DG Chest Port 1 View  Result Date: 06/09/2020 CLINICAL DATA:  Respiratory failure. EXAM: PORTABLE CHEST 1 VIEW COMPARISON:  06/08/2020. FINDINGS: Endotracheal tube, left IJ line, right IJ line in stable position. Heart size stable. Low lung volumes with bibasilar atelectasis again noted. Bibasilar infiltrates cannot be excluded. Stable elevation right hemidiaphragm. No pleural effusion or pneumothorax. IMPRESSION: 1. Lines and tubes in stable position. 2. Low lung volumes with bibasilar atelectasis again noted. Bibasilar infiltrates cannot be excluded. Exam stable from prior exam. Electronically Signed   By: Thomas  Register   On: 06/09/2020 05:26   DG CHEST PORT 1  VIEW  Result Date: 06/08/2020 CLINICAL DATA:  Central line placement. EXAM: PORTABLE CHEST 1 VIEW COMPARISON:  Radiograph 06/09/2019 FINDINGS: Right-sided dialysis catheter tip in the mid SVC. Left central line and endotracheal tube remain in place. No pneumothorax. Elevation of the right hemidiaphragm with basilar atelectasis, unchanged. Possible small right pleural effusion. Stable heart size and mediastinal contours. IMPRESSION: Right-sided dialysis catheter tip in the mid SVC. No pneumothorax. Otherwise stable exam. Electronically Signed   By: Melanie  Sanford M.D.   On: 06/08/2020 20:41   DG Chest Portable 1 View  Result Date: 06/08/2020 CLINICAL DATA:  Central catheter placement.  Hypoxia. EXAM: PORTABLE CHEST 1 VIEW COMPARISON:  June 08, 2020 study obtained earlier in the day FINDINGS: Endotracheal tube tip is 3.2 cm above the carina. Central catheter tip is at the cavoatrial junction. No pneumothorax. There is atelectatic change in the right lower lung region with small right pleural effusion. There is stable elevation of the right hemidiaphragm. The left lung is clear. The heart size and pulmonary vascularity are normal. No adenopathy. No bone lesions IMPRESSION: Tube and catheter positions as described without pneumothorax. There is atelectatic change in the right lower lung region with small right pleural effusion. Left lung clear. Heart size within normal limits. Electronically Signed   By: William  Woodruff III M.D.   On: 06/08/2020 17:02   DG Chest Portable 1 View  Result Date: 06/08/2020 CLINICAL DATA:  Status post intubation. EXAM: PORTABLE CHEST 1 VIEW COMPARISON:  Single-view of the chest 05/10/2020. FINDINGS: New endotracheal tube is in place with the tip 2.3 cm above the carina. Defibrillator pad also noted. Mild subsegmental atelectasis is seen in the right lung base. Lungs otherwise clear. No pneumothorax or pleural fluid. IMPRESSION: ETT tip is 2.3 cm above the carina. Mild  subsegmental atelectasis right lung base. Electronically Signed     By: Inge Rise M.D.   On: 06/08/2020 14:50     Medications:   .  prismasol BGK 4/2.5 400 mL/hr at 06/08/20 2121  . sodium chloride Stopped (06/08/20 1502)  . calcium gluconate    . dextrose 5 % and 0.9% NaCl    . midazolam    . norepinephrine (LEVOPHED) Adult infusion 29 mcg/min (06/09/20 0900)  . phenylephrine (NEO-SYNEPHRINE) Adult infusion 290 mcg/min (06/08/20 2327)  . piperacillin-tazobactam Stopped (06/09/20 0543)  . prismasol BGK 4/2.5 2,000 mL/hr at 06/09/20 0511  . sodium bicarbonate (isotonic) 1000 mL infusion 125 mL/hr at 06/09/20 9509  . sodium chloride    . vasopressin 0.03 Units/min (06/09/20 0900)   . chlorhexidine gluconate (MEDLINE KIT)  15 mL Mouth Rinse BID  . Chlorhexidine Gluconate Cloth  6 each Topical Daily  . dextrose      . heparin injection (subcutaneous)  5,000 Units Subcutaneous Q8H  . insulin aspart  0-9 Units Subcutaneous Q4H  . mouth rinse  15 mL Mouth Rinse 10 times per day  . mupirocin ointment  1 application Nasal BID  . pantoprazole (PROTONIX) IV  40 mg Intravenous Q24H  . sodium chloride flush  10-40 mL Intracatheter Q12H   docusate, fentaNYL (SUBLIMAZE) injection, heparin, midazolam, polyethylene glycol, sodium chloride, sodium chloride flush  Assessment/ Plan:  1.Renal- Acute kidney Injury  Negative for blood and protein. Normal renal function when admitted 07/16/69   Certainly appears to have had some acute injury. Doubt obstruction but will check renal ultrasound. No signs of acute GN or AIN on initial urinalysis. Platelets appear to be ok and doubt this represents a TMA although still needs to be considered. She was also taking an ACE inhibitor as far as we can tell. This may be rhabdomyolysis although urine dipstick has no blood. A CPK was elevated at over 5000. This may be contributing. She continues on CRRT 06/08/2020. We will change replacement fluid and dialysate to  4K. 2. Hypertension/volume  -  Shock hypotension. Considering cardiogenic and septic   Antibiotics and cultures per primary Service. Pressor support to maintain MAP 3. VDRF - recent mild covid   CXR  Non revealing   As per critical care 4. Unresponsiveness  CT head and drug screen and airway management per CCM. 5. Metabolic Acidosis    appears to be resolved. Will change to 4K/2.5 calcium bath's 6. Hyperkalemia   resolved. With CRRT   LOS: [place] _0 _1 :23 AM

## 2020-06-10 ENCOUNTER — Inpatient Hospital Stay (HOSPITAL_COMMUNITY): Payer: Medicare Other

## 2020-06-10 DIAGNOSIS — R6521 Severe sepsis with septic shock: Secondary | ICD-10-CM

## 2020-06-10 DIAGNOSIS — A419 Sepsis, unspecified organism: Secondary | ICD-10-CM | POA: Diagnosis not present

## 2020-06-10 DIAGNOSIS — J9601 Acute respiratory failure with hypoxia: Secondary | ICD-10-CM

## 2020-06-10 DIAGNOSIS — I469 Cardiac arrest, cause unspecified: Secondary | ICD-10-CM | POA: Diagnosis not present

## 2020-06-10 DIAGNOSIS — M6282 Rhabdomyolysis: Secondary | ICD-10-CM

## 2020-06-10 DIAGNOSIS — N179 Acute kidney failure, unspecified: Secondary | ICD-10-CM | POA: Diagnosis not present

## 2020-06-10 DIAGNOSIS — J690 Pneumonitis due to inhalation of food and vomit: Secondary | ICD-10-CM | POA: Diagnosis not present

## 2020-06-10 LAB — GLUCOSE, CAPILLARY
Glucose-Capillary: 104 mg/dL — ABNORMAL HIGH (ref 70–99)
Glucose-Capillary: 111 mg/dL — ABNORMAL HIGH (ref 70–99)
Glucose-Capillary: 107 mg/dL — ABNORMAL HIGH (ref 70–99)
Glucose-Capillary: 104 mg/dL — ABNORMAL HIGH (ref 70–99)
Glucose-Capillary: 139 mg/dL — ABNORMAL HIGH (ref 70–99)
Glucose-Capillary: 131 mg/dL — ABNORMAL HIGH (ref 70–99)

## 2020-06-10 LAB — MAGNESIUM
Magnesium: 2.4 mg/dL (ref 1.7–2.4)
Magnesium: 2.6 mg/dL — ABNORMAL HIGH (ref 1.7–2.4)

## 2020-06-10 LAB — COMPREHENSIVE METABOLIC PANEL WITH GFR
ALT: 41 U/L (ref 0–44)
AST: 172 U/L — ABNORMAL HIGH (ref 15–41)
Albumin: 2.7 g/dL — ABNORMAL LOW (ref 3.5–5.0)
Alkaline Phosphatase: 107 U/L (ref 38–126)
Anion gap: 22 — ABNORMAL HIGH (ref 5–15)
BUN: 100 mg/dL — ABNORMAL HIGH (ref 6–20)
CO2: 21 mmol/L — ABNORMAL LOW (ref 22–32)
Calcium: 7.4 mg/dL — ABNORMAL LOW (ref 8.9–10.3)
Chloride: 95 mmol/L — ABNORMAL LOW (ref 98–111)
Creatinine, Ser: 10.14 mg/dL — ABNORMAL HIGH (ref 0.44–1.00)
GFR, Estimated: 4 mL/min — ABNORMAL LOW
Glucose, Bld: 73 mg/dL (ref 70–99)
Potassium: 4 mmol/L (ref 3.5–5.1)
Sodium: 138 mmol/L (ref 135–145)
Total Bilirubin: 1.1 mg/dL (ref 0.3–1.2)
Total Protein: 6.3 g/dL — ABNORMAL LOW (ref 6.5–8.1)

## 2020-06-10 LAB — CBC
HCT: 32.9 % — ABNORMAL LOW (ref 36.0–46.0)
Hemoglobin: 11.2 g/dL — ABNORMAL LOW (ref 12.0–15.0)
MCH: 30.9 pg (ref 26.0–34.0)
MCHC: 34 g/dL (ref 30.0–36.0)
MCV: 90.9 fL (ref 80.0–100.0)
Platelets: 185 10*3/uL (ref 150–400)
RBC: 3.62 MIL/uL — ABNORMAL LOW (ref 3.87–5.11)
RDW: 13.7 % (ref 11.5–15.5)
WBC: 17.6 10*3/uL — ABNORMAL HIGH (ref 4.0–10.5)
nRBC: 0 % (ref 0.0–0.2)

## 2020-06-10 LAB — RENAL FUNCTION PANEL
Albumin: 2.5 g/dL — ABNORMAL LOW (ref 3.5–5.0)
Albumin: 2.7 g/dL — ABNORMAL LOW (ref 3.5–5.0)
Anion gap: 15 (ref 5–15)
Anion gap: 14 (ref 5–15)
BUN: 17 mg/dL (ref 6–20)
BUN: 7 mg/dL (ref 6–20)
CO2: 19 mmol/L — ABNORMAL LOW (ref 22–32)
CO2: 21 mmol/L — ABNORMAL LOW (ref 22–32)
Calcium: 8.2 mg/dL — ABNORMAL LOW (ref 8.9–10.3)
Calcium: 8.4 mg/dL — ABNORMAL LOW (ref 8.9–10.3)
Chloride: 101 mmol/L (ref 98–111)
Chloride: 101 mmol/L (ref 98–111)
Creatinine, Ser: 2.13 mg/dL — ABNORMAL HIGH (ref 0.44–1.00)
Creatinine, Ser: 1.53 mg/dL — ABNORMAL HIGH (ref 0.44–1.00)
GFR, Estimated: 28 mL/min — ABNORMAL LOW (ref >60)
GFR, Estimated: 41 mL/min — ABNORMAL LOW (ref >60)
Glucose, Bld: 105 mg/dL — ABNORMAL HIGH (ref 70–99)
Glucose, Bld: 104 mg/dL — ABNORMAL HIGH (ref 70–99)
Phosphorus: 2.4 mg/dL — ABNORMAL LOW (ref 2.5–4.6)
Phosphorus: 1.9 mg/dL — ABNORMAL LOW (ref 2.5–4.6)
Potassium: 4.3 mmol/L (ref 3.5–5.1)
Potassium: 4.2 mmol/L (ref 3.5–5.1)
Sodium: 135 mmol/L (ref 135–145)
Sodium: 136 mmol/L (ref 135–145)

## 2020-06-10 LAB — POCT I-STAT 7, (LYTES, BLD GAS, ICA,H+H)
Acid-Base Excess: 1 mmol/L (ref 0.0–2.0)
Bicarbonate: 24.2 mmol/L (ref 20.0–28.0)
Calcium, Ion: 1.05 mmol/L — ABNORMAL LOW (ref 1.15–1.40)
HCT: 33 % — ABNORMAL LOW (ref 36.0–46.0)
Hemoglobin: 11.2 g/dL — ABNORMAL LOW (ref 12.0–15.0)
O2 Saturation: 96 %
Patient temperature: 36.3
Potassium: 4.4 mmol/L (ref 3.5–5.1)
Sodium: 136 mmol/L (ref 135–145)
TCO2: 25 mmol/L (ref 22–32)
pCO2 arterial: 31.9 mmHg — ABNORMAL LOW (ref 32.0–48.0)
pH, Arterial: 7.485 — ABNORMAL HIGH (ref 7.350–7.450)
pO2, Arterial: 71 mmHg — ABNORMAL LOW (ref 83.0–108.0)

## 2020-06-10 LAB — CALCIUM, IONIZED: Calcium, Ionized, Serum: 3.1 mg/dL — ABNORMAL LOW (ref 4.5–5.6)

## 2020-06-10 LAB — PROCALCITONIN: Procalcitonin: 11.85 ng/mL

## 2020-06-10 MED ORDER — PROSOURCE TF PO LIQD
45.0000 mL | Freq: Every day | ORAL | Status: DC
  Administered 2020-06-10 – 2020-06-16 (×7): 45 mL
  Filled 2020-06-10 (×7): qty 45

## 2020-06-10 MED ORDER — ALBUMIN HUMAN 25 % IV SOLN: 25.0000 g | Freq: Once | INTRAVENOUS | Status: AC

## 2020-06-10 MED ORDER — ALBUMIN HUMAN 25 % IV SOLN
INTRAVENOUS | Status: AC
  Administered 2020-06-10: 25 g via INTRAVENOUS
  Filled 2020-06-10: qty 100

## 2020-06-10 MED ORDER — VITAL 1.5 CAL PO LIQD: 1000.0000 mL | ORAL | Status: DC

## 2020-06-10 MED ORDER — ALBUMIN HUMAN 5 % IV SOLN: 12.5000 g | Freq: Once | INTRAVENOUS | Status: AC

## 2020-06-10 MED ORDER — B COMPLEX-C PO TABS
1.0000 | ORAL_TABLET | Freq: Every day | ORAL | Status: DC
  Administered 2020-06-10 – 2020-06-16 (×7): 1
  Filled 2020-06-10 (×7): qty 1

## 2020-06-10 MED ORDER — CALCIUM GLUCONATE-NACL 1-0.675 GM/50ML-% IV SOLN
1.0000 g | Freq: Once | INTRAVENOUS | Status: AC
  Administered 2020-06-10: 1000 mg via INTRAVENOUS
  Filled 2020-06-10: qty 50

## 2020-06-10 MED ORDER — VITAL AF 1.2 CAL PO LIQD
1000.0000 mL | ORAL | Status: DC
  Administered 2020-06-10 – 2020-06-13 (×4): 1000 mL
  Filled 2020-06-10 (×5): qty 1000

## 2020-06-10 NOTE — Procedures (Signed)
Cortrak  Person Inserting Tube:  Acen Craun, Kayce C, RD Tube Type:  Cortrak - 43 inches Tube Location:  Left nare Initial Placement:  Stomach Secured by: Bridle Technique Used to Measure Tube Placement:  Documented cm marking at nare/ corner of mouth Cortrak Secured At:  65 cm    Cortrak Tube Team Note:  Consult received to place a Cortrak feeding tube.   No x-ray is required. RN may begin using tube.    If the tube becomes dislodged please keep the tube and contact the Cortrak team at [email or web address] (password [email or web address]) for replacement.  If after hours and replacement cannot be delayed, place a NG tube and confirm placement with an abdominal x-ray.    Monay P., RD, LDN, CNSC See AMiON for contact information    

## 2020-06-10 NOTE — Progress Notes (Signed)
Initial Nutrition Assessment  DOCUMENTATION CODES:   Not applicable  INTERVENTION:   Tube Feeding via Cortrak:  Vital AF 1.2 at 60 mlhr Pro-Source TF 45 mL daily Provides 1768 kcals, 119 g of protein and 1166 mL of free water  Add B-complex with C to replace losses while on CRRT   NUTRITION DIAGNOSIS:   Inadequate oral intake related to acute illness as evidenced by NPO status.  GOAL:   Patient will meet greater than or equal to 90% of their needs  MONITOR:   PO intake,Supplement acceptance,Labs,Weight trends  REASON FOR ASSESSMENT:   Consult,Ventilator Enteral/tube feeding initiation and management  ASSESSMENT:   50 yo female admitted post cardiac arrest likely from opiate/amphetamine overdose requiring intubation, AKI from ATN requiring CRRT. PMH includes PTSD, depression, HTN, HLD, COVID-19 infection (04/2020)   2/16 Admitted, Intubated, CRRT initiated  Patient is currently intubated on ventilator support, requiring multiple pressors (levophed 40 mcg/min, vasopressin 0.03 units/min, phenylephrine 300 mcg/min) MV: 12.1 L/min Temp (24hrs), Avg:96.6 F (35.9 C), Min:93.9 F (34.4 C), Max:98.1 F (36.7 C)  Propofol: NONE  Current weight 89.5 kg  Unable to obtain diet and weight history at this time  Currently no OG or NG access; per RN, OG attempted but unsuccessful. Plan to attempt Cortrak today   Labs: phosphorus 2.4 Meds: ss novolog   Diet Order:   Diet Order            Diet NPO time specified  Diet effective now                 EDUCATION NEEDS:   Not appropriate for education at this time  Skin:  Skin Assessment: Reviewed RN Assessment  Last BM:  PTA  Height:   Ht Readings from Last 1 Encounters:  06/08/20 5\' 6"  (1.676 m)    Weight:   Wt Readings from Last 1 Encounters:  06/10/20 89.5 kg    BMI:  Body mass index is 31.85 kg/m.  Estimated Nutritional Needs:   Kcal:  1815 kcals  Protein:  105-120 g  Fluid:  >/= 1.8  L   Kerman Passey MS, RDN, LDN, CNSC Registered Dietitian III Clinical Nutrition RD Pager and On-Call Pager Number Located in [place]

## 2020-06-10 NOTE — Procedures (Signed)
Arterial Catheter Insertion Procedure Note  ARCHIE ATILANO  [id number]  October 10, 1970  Date:06/10/20  Time:10:02 AM    Provider Performing: Montey Hora    Procedure: Insertion of Arterial Line [id number]) without US guidance  Indication(s) Blood pressure monitoring and/or need for frequent ABGs  Consent Unable to obtain consent due to emergent nature of procedure.  Anesthesia None   Time Out Verified patient identification, verified procedure, site/side was marked, verified correct patient position, special equipment/implants available, medications/allergies/relevant history reviewed, required imaging and test results available.   Sterile Technique Maximal sterile technique including full sterile barrier drape, hand hygiene, sterile gown, sterile gloves, mask, hair covering, sterile ultrasound probe cover (if used).   Procedure Description Area of catheter insertion was cleaned with chlorhexidine and draped in sterile fashion. Without real-time ultrasound guidance an arterial catheter was placed into the right femoral artery.  Appropriate arterial tracings confirmed on monitor.     Complications/Tolerance None; patient tolerated the procedure well.   EBL Minimal   Specimen(s) None   Multiple attempts at right radial by myself.  Vessel cannulated on 1st and 2nd attempt but unable to pass wire appropriately. 3rd attempt unable to cannulate vessel. Left radial previously attempted by RT without success.   Montey Hora, [facility] For pager details, please see AMION If no response to pager, please call [phone] until 7:00 PM After 7:00 PM, please call Elink at [phone] 06/10/2020, 10:03 AM

## 2020-06-10 NOTE — Progress Notes (Signed)
RT NOTES: A-Line came out of right wrist. Attempted to place A-line in left radial x3 without success with use of doppler. Notified MD.

## 2020-06-10 NOTE — Progress Notes (Signed)
eLink Physician-Brief Progress Note Patient Name: [name] DOB: 1970-09-15 MRN: [id number]   Date of Service  06/10/2020  HPI/Events of Note  Hypotension persists despite 25 gm og 25 % Albumin iv + Phenylephrine infusion.  eICU Interventions  Additional 12.5 gm of 5 % Albumin ordered.     Intervention Category Major Interventions: Hypotension - evaluation and management;Hypovolemia - evaluation and treatment with fluids  Adison Reifsteck U Jenayah Antu 06/10/2020, 6:21 AM

## 2020-06-10 NOTE — Progress Notes (Signed)
eLink Physician-Brief Progress Note Patient Name: ARDITH TEST DOB: 06-Sep-1970 MRN: [id number]   Date of Service  06/10/2020  HPI/Events of Note  Patient with hypotension while on CRRT, she is not receiving ultrafiltration.  eICU Interventions  Albumin 25 % 25 gm iv x 1 ordered.        Kerry Kass Rhylee Nunn 06/10/2020, 4:58 AM

## 2020-06-10 NOTE — Progress Notes (Addendum)
NAME:  [name], MRN:  [id number], DOB:  1970/08/23, LOS: 2 ADMISSION DATE:  06/08/2020, CONSULTATION DATE:  06/08/2020 REFERRING MD:  Dr. Karle Starch, ER, CHIEF COMPLAINT:  Cardiac arrest   Brief History:  50 yo female with hx of opiate use, PTSD, and depression was laying on the floor for several hours.  Found by EMS to have agonal respiratory pattern and then asystole.  Had [facility] airway placed and received epinephrine with 3 minutes of CPR before ROSC achieved.  In ER found to have severe acidosis, hypokalemia, acute renal failure, hypothermia, and shock.  UDS positive for amphetamines.  Past Medical History:  PTSD, Depression, HTN, HLD, Headache, Opiate dependence, Back pain, Anxiety, Osteoarthritis, COVID 19 infection January 2022  Significant Hospital Events:  2/16 admit, start CRRT  Consults:  Nephrology  Procedures:  ETT 2/16 >> Arterial line 2/16 >> HD cath 2/16 >> CVL 2/16 >>   Significant Diagnostic Tests:   CT head 2/16 >> normal brain   Renal u/s 2/16 >> unremarkable  Echo 2/17 >> EF 55-60%.  Micro Data:  Blood 2/16 >> Sputum 2/16 >> MRSA PCR 2/16 >> positive  Antimicrobials:  Zosyn 2/16 >>    Interim History / Subjective:  Remains on pressors, CRRT, vent support.  Sedation being lightened.  Moving extremities but not following commands.  Objective   Blood pressure (!) 104/31, pulse 93, temperature 98.1 F (36.7 C), temperature source Bladder, resp. rate (!) 21, height 5\' 6"  (1.676 m), weight 89.5 kg, last menstrual period 06/03/2018, SpO2 100 %.    Vent Mode: PSV;CPAP FiO2 (%):  [30 %-40 %] 40 % Set Rate:  [20 bmp] 20 bmp Vt Set:  [470 mL] 470 mL PEEP:  [5 cmH20] 5 cmH20 Pressure Support:  [10 cmH20] 10 cmH20 Plateau Pressure:  [18 cmH20] 18 cmH20   Intake/Output Summary (Last 24 hours) at 06/10/2020 1003 Last data filed at 06/10/2020 0900 Gross per 24 hour  Intake 2699.38 ml  Output 1881 ml  Net 818.38 ml   Filed Weights   06/08/20 2052  06/09/20 0428 06/10/20 0500  Weight: 88 kg 88 kg 89.5 kg    Examination: General: Young adult female, critically ill. Neuro: Sedated, not responsive to commands.  MAE's. HEENT: [place]/AT. Sclerae anicteric. ETT in place. Cardiovascular: RRR, no M/R/G.  Lungs: Respirations even and unlabored.  CTA bilaterally, No W/R/R. Abdomen: BS x 4, soft, NT/ND.  Musculoskeletal: No gross deformities, no edema.  Skin: Intact, warm, no rashes.   Resolved Hospital Problem list   Anion gap metabolic acidosis with lactic acidosis, Hyperkalemia with ECG changes  Assessment & Plan:   Cardiac arrest with asystole with circulatory shock - likely from opiate/amphetamine overdose.  Echo reassuring. Hx of HTN. - Continue levo, neo, vaso to keep MAP > 65. - Place arterial line (prior line displaced). - No indication for stress steroids, cortisol 63. - hold outpt HCTZ, lisinopril.  Acute metabolic encephalopathy 2nd to hypoxia, renal failure, sepsis. Hx of depression, PTSD, anxiety, chronic opiate use. - Continues to require sedation while on ventilator for comfort but attempting to lighten today to hopefully facilitate SBT and assess mental status - Continue versed as tolerates better then Fentanyl (HoTN). - hold outpt prozac, lamictal, risperdal, zanaflex, trazodone.  Septic shock likely from aspiration pneumonitis. - f/u sputum, blood culture. - day 3 of zosyn.  AKI from ATN in setting of shock and hypoxia. Hypocalcemia. Rhabdomyolisis. - nephrology following, appreciate the assistance. - continue CRRT. - 1g Ca gluconate. - Follow  BMP.  Hypothermia. - external warming blanket while on CRRT.  Acute hypoxic respiratory failure. - full vent support. - SBT when mental status allows. - f/u CXR, ABG.  Hyperglycemia- HbA1C 6.1 from 2/16. - SSI.   Best practice (evaluated daily)  Diet: NPO;  Place cortrak and start TF's. DVT prophylaxis: SQ heparin GI prophylaxis: protonix Mobility: bed  rest Disposition: ICU  Goals of Care:  Last date of multidisciplinary goals of care discussion: pending Family and staff present: pending Summary of discussion: pending Follow up goals of care discussion due: pending, attempting to contact family Code Status: ICU   Critical care time: 35 minutes    Demetrica Zipp Shearon Stalls, PA - [facility] For pager details, please see AMION If no response to pager, please call [phone] until 7:00 PM After 7:00 PM, please call Elink at [phone] 06/10/2020, 10:13 AM

## 2020-06-10 NOTE — Progress Notes (Signed)
[name] ROUNDING NOTE   Subjective:   Brief History: 50 y.o. female. With a significant history of hypertension, endometriosis and depression that was seen in the IR 05/10/20 for abdominal pain and was [name] to have Covid 19 infection   She was mildly symptomatic and was discharged home on 05/12/20. She apparently fell out of bed during the night and was [name] to be in asystole and she was intubated and underwent chest compressions as well as EPI with ROSC. Thought that cardiac arrest was related to opioid overdose. AKI now requiring CRRT starting 2/16 likely 2/2 ATN  In the ER, she was seen by Dr [name], Cardiology and 2 D Echo performed   She had a preserved ejection fraction and no wall motion abnormality. Her blood pressure was 58/33  Pulse was 87 and she was hypothermic at 92 degrees.  Interval History: Required increasing vasopressors overnight.  Possible issue with arterial line.  Blood pressure improving today.  Minimal urine output with 2.1 liters of ultrafiltration in the past 24 hours.  Objective:  Vital signs in last 24 hours:  Temp:  [93.9 F (34.4 C)-98.1 F (36.7 C)] 98.1 F (36.7 C) (02/18 0930) Pulse Rate:  [67-94] 81 (02/18 1300) Resp:  [18-27] 18 (02/18 1300) BP: (71-147)/(31-87) 147/66 (02/18 1125) SpO2:  [95 %-100 %] 100 % (02/18 1300) Arterial Line BP: (65-154)/(51-73) 130/60 (02/18 1300) FiO2 (%):  [30 %-40 %] 40 % (02/18 1125) Weight:  [89.5 kg] 89.5 kg (02/18 0500)  Weight change: 1.5 kg Filed Weights   06/08/20 2052 06/09/20 0428 06/10/20 0500  Weight: 88 kg 88 kg 89.5 kg    Intake/Output: I/O last 3 completed shifts: In: 5335.2 [I.V.:4883.7; IV Piggyback:451.5] Out: 7829 [FAOZH:086; Other:2831]   Intake/Output this shift:  Total I/O In: 754.5 [I.V.:654.5; IV Piggyback:100] Out: 48 [Other:48]  Intubated and sedated CVS-regular rate and rhythm RS-mechanically supported breath sounds ABD- BS present soft non-distended EXT- no edema  right leg in boot   Basic Metabolic Panel: Recent Labs  Lab 06/08/20 1423 06/08/20 1529 06/08/20 1754 06/08/20 1847 06/08/20 1944 06/08/20 2238 06/09/20 0440 06/09/20 0446 06/09/20 1730 06/10/20 0359 06/10/20 0405  NA 137 131*   < >  --  135   < > 138 136 135 135 136  K 6.5* 6.1*   < >  --  4.0   < > 4.0 3.8 4.2 4.3 4.4  CL 89* 96*  --   --  86*  --  95*  --  100 101  --   CO2 <7*  --   --   --  11*  --  21*  --  19* 19*  --   GLUCOSE 180* 163*  --   --  235*  --  73  --  107* 105*  --   BUN 214* >130*  --   --  204*  --  100*  --  39* 17  --   CREATININE 24.91* >18.00*  --   --  22.55*  --  10.14*  --  3.76* 2.13*  --   CALCIUM 7.7*  --   --   --  6.5*  --  7.4*  --  7.8* 8.2*  --   MG  --   --   --  2.7*  --   --  2.1  --   --  2.4  --   PHOS  --   --   --   --  >30.0*  --  5.1*  --  2.9 2.4*  --    < > = values in this interval not displayed.    Liver Function Tests: Recent Labs  Lab 06/08/20 1423 06/08/20 1944 06/09/20 0440 06/09/20 1730 06/10/20 0359  AST 93*  --  172*  --   --   ALT 26  --  41  --   --   ALKPHOS 109  --  107  --   --   BILITOT 0.9  --  1.1  --   --   PROT 6.6  --  6.3*  --   --   ALBUMIN 2.4* 2.3* 2.7* 2.4* 2.5*   No results for input(s): LIPASE, AMYLASE in the last 168 hours. No results for input(s): AMMONIA in the last 168 hours.  CBC: Recent Labs  Lab 06/08/20 1423 06/08/20 1529 06/08/20 2238 06/09/20 0446 06/09/20 0615 06/10/20 0359 06/10/20 0405  WBC 16.2*  --   --   --  14.0* 17.6*  --   HGB 10.6*   < > 11.6* 11.2* 11.2* 11.2* 11.2*  HCT 33.2*   < > 34.0* 33.0* 32.0* 32.9* 33.0*  MCV 98.8  --   --   --  87.0 90.9  --   PLT 276  --   --   --  224 185  --    < > = values in this interval not displayed.    Cardiac Enzymes: Recent Labs  Lab 06/08/20 1847  CKTOTAL 5,564*    BNP: Invalid input(s): POCBNP  CBG: Recent Labs  Lab 06/09/20 1935 06/09/20 2330 06/10/20 0403 06/10/20 0718 06/10/20 1140  GLUCAP 82 99  104* 111* 107*    Microbiology: Results for orders placed or performed during the hospital encounter of 06/08/20  MRSA PCR Screening     Status: Abnormal   Collection Time: 06/08/20  8:44 PM   Specimen: Nasopharyngeal  Result Value Ref Range Status   MRSA by PCR POSITIVE (A) NEGATIVE Final    Comment:        The GeneXpert MRSA Assay (FDA approved for NASAL specimens only), is one component of a comprehensive MRSA colonization surveillance program. It is not intended to diagnose MRSA infection nor to guide or monitor treatment for MRSA infections. RESULT CALLED TO, READ BACK BY AND VERIFIED WITH: Ferrel Logan 06/08/20 2359 JDW Performed at [facility], [address] [place], [place] [address]   Blood culture (routine x 2)     Status: None (Preliminary result)   Collection Time: 06/08/20  9:25 PM   Specimen: BLOOD  Result Value Ref Range Status   Specimen Description BLOOD HEMODIALYSIS CATHETER  Final   Special Requests   Final    BOTTLES DRAWN AEROBIC AND ANAEROBIC Blood Culture results may not be optimal due to an excessive volume of blood received in culture bottles   Culture   Final    NO GROWTH < 12 HOURS Performed at [facility], [place] [address], [place], [place] [address]    Report Status PENDING  Incomplete  Blood culture (routine x 2)     Status: None (Preliminary result)   Collection Time: 06/08/20  9:25 PM   Specimen: BLOOD LEFT WRIST  Result Value Ref Range Status   Specimen Description BLOOD LEFT WRIST  Final   Special Requests   Final    BOTTLES DRAWN AEROBIC ONLY Blood Culture adequate volume Performed at [facility], [place] [address], [place], [place] [address]  Culture PENDING  Incomplete   Report Status PENDING  Incomplete    Coagulation Studies: Recent Labs    06/08/20 1847  LABPROT 19.9*  INR 1.8*    Urinalysis: Recent Labs    06/08/20 1512  COLORURINE AMBER*  LABSPEC 1.023  PHURINE 5.0  GLUCOSEU NEGATIVE  HGBUR  NEGATIVE  BILIRUBINUR NEGATIVE  KETONESUR NEGATIVE  PROTEINUR NEGATIVE  NITRITE NEGATIVE  LEUKOCYTESUR NEGATIVE      Imaging: CT Head Wo Contrast  Result Date: 06/08/2020 CLINICAL DATA:  [name] on the floor. EXAM: CT HEAD WITHOUT CONTRAST TECHNIQUE: Contiguous axial images were obtained from the base of the skull through the vertex without intravenous contrast. COMPARISON:  03/21/2020 FINDINGS: Brain: The brain has a normal appearance without evidence of malformation, atrophy, old or acute small or large vessel infarction, mass lesion, hemorrhage, hydrocephalus or extra-axial collection. Vascular: Major vessels at the base of the brain show flow. Venous sinuses appear patent. Skull and upper cervical spine: Normal. Sinuses/Orbits: Small amount of layering fluid in the right maxillary sinus. Other: None significant. IMPRESSION: 1. Normal appearance of the brain itself. 2. Small amount of layering fluid in the right maxillary sinus. Electronically Signed   By: Nelson Chimes M.D.   On: 06/08/2020 18:29   US RENAL  Result Date: 06/08/2020 CLINICAL DATA:  Acute renal insufficiency EXAM: RENAL / URINARY TRACT ULTRASOUND COMPLETE COMPARISON:  05/10/2020 FINDINGS: Right Kidney: Renal measurements: 8.5 x 4.7 x 4.2 cm = volume: 86.8 mL. Echogenicity within normal limits. No mass or hydronephrosis visualized. Left Kidney: Renal measurements: 10.3 x 4.5 x 4.2 cm = volume: 102.3 mL. Echogenicity within normal limits. No mass or hydronephrosis visualized. Bladder: Bladder is decompressed, limiting its evaluation. Other: None. IMPRESSION: Unremarkable renal ultrasound. Electronically Signed   By: Randa Ngo M.D.   On: 06/08/2020 22:42   DG Chest Port 1 View  Result Date: 06/10/2020 CLINICAL DATA:  Respiratory failure EXAM: PORTABLE CHEST 1 VIEW COMPARISON:  06/09/2020 FINDINGS: Support devices are stable. Patient is rotated to the right, limiting study. Cardiomegaly. Bibasilar atelectasis, right greater than  left. No visible effusions or acute bony abnormality. IMPRESSION: Limited study due to rotation. Bibasilar atelectasis. Support devices stable. Electronically Signed   By: Rolm Baptise M.D.   On: 06/10/2020 07:19   DG Chest Port 1 View  Result Date: 06/09/2020 CLINICAL DATA:  Respiratory failure. EXAM: PORTABLE CHEST 1 VIEW COMPARISON:  06/08/2020. FINDINGS: Endotracheal tube, left IJ line, right IJ line in stable position. Heart size stable. Low lung volumes with bibasilar atelectasis again noted. Bibasilar infiltrates cannot be excluded. Stable elevation right hemidiaphragm. No pleural effusion or pneumothorax. IMPRESSION: 1. Lines and tubes in stable position. 2. Low lung volumes with bibasilar atelectasis again noted. Bibasilar infiltrates cannot be excluded. Exam stable from prior exam. Electronically Signed   By: Marcello Moores  Register   On: 06/09/2020 05:26   DG CHEST PORT 1 VIEW  Result Date: 06/08/2020 CLINICAL DATA:  Central line placement. EXAM: PORTABLE CHEST 1 VIEW COMPARISON:  Radiograph 06/09/2019 FINDINGS: Right-sided dialysis catheter tip in the mid SVC. Left central line and endotracheal tube remain in place. No pneumothorax. Elevation of the right hemidiaphragm with basilar atelectasis, unchanged. Possible small right pleural effusion. Stable heart size and mediastinal contours. IMPRESSION: Right-sided dialysis catheter tip in the mid SVC. No pneumothorax. Otherwise stable exam. Electronically Signed   By: Keith Rake M.D.   On: 06/08/2020 20:41   DG Chest Portable 1 View  Result Date: 06/08/2020 CLINICAL DATA:  Central catheter  placement.  Hypoxia. EXAM: PORTABLE CHEST 1 VIEW COMPARISON:  June 08, 2020 study obtained earlier in the day FINDINGS: Endotracheal tube tip is 3.2 cm above the carina. Central catheter tip is at the cavoatrial junction. No pneumothorax. There is atelectatic change in the right lower lung region with small right pleural effusion. There is stable elevation  of the right hemidiaphragm. The left lung is clear. The heart size and pulmonary vascularity are normal. No adenopathy. No bone lesions IMPRESSION: Tube and catheter positions as described without pneumothorax. There is atelectatic change in the right lower lung region with small right pleural effusion. Left lung clear. Heart size within normal limits. Electronically Signed   By: Lowella Grip III M.D.   On: 06/08/2020 17:02   DG Chest Portable 1 View  Result Date: 06/08/2020 CLINICAL DATA:  Status post intubation. EXAM: PORTABLE CHEST 1 VIEW COMPARISON:  Single-view of the chest 05/10/2020. FINDINGS: New endotracheal tube is in place with the tip 2.3 cm above the carina. Defibrillator pad also noted. Mild subsegmental atelectasis is seen in the right lung base. Lungs otherwise clear. No pneumothorax or pleural fluid. IMPRESSION: ETT tip is 2.3 cm above the carina. Mild subsegmental atelectasis right lung base. Electronically Signed   By: Inge Rise M.D.   On: 06/08/2020 14:50   ECHOCARDIOGRAM COMPLETE  Result Date: 06/09/2020    ECHOCARDIOGRAM REPORT   Patient Name:   [name] Date of Exam: 06/09/2020 Medical Rec #:  [id number]       Height:       66.0 in Accession #:    [id number]      Weight:       194.0 lb Date of Birth:  Sep 03, 1970      BSA:          1.974 m Patient Age:    68 years        BP:           122/78 mmHg Patient Gender: F               HR:           101 bpm. Exam Location:  Inpatient Procedure: 2D Echo Indications:    Cardiac Arrest I46.9  History:        Patient has prior history of Echocardiogram examinations, most                 recent 12/13/2014. Risk Factors:Hypertension and Dyslipidemia.  Sonographer:    Mikki Santee RDCS (AE) Referring Phys: [id number] VINEET SOOD IMPRESSIONS  1. Left ventricular ejection fraction, by estimation, is 55 to 60%. The left ventricle has normal function. The left ventricle has no regional wall motion abnormalities. Left ventricular diastolic  parameters were normal.  2. Right ventricular systolic function is normal. The right ventricular size is normal.  3. The mitral valve is normal in structure. Trivial mitral valve regurgitation. No evidence of mitral stenosis.  4. The aortic valve is grossly normal. Aortic valve regurgitation is moderate. Comparison(s): Changes from prior study are noted. Now with moderate aortic regurgitation. FINDINGS  Left Ventricle: Left ventricular ejection fraction, by estimation, is 55 to 60%. The left ventricle has normal function. The left ventricle has no regional wall motion abnormalities. The left ventricular internal cavity size was normal in size. There is  no left ventricular hypertrophy. Left ventricular diastolic parameters were normal. Right Ventricle: The right ventricular size is normal. Right vetricular wall thickness was not well visualized. Right ventricular systolic function is  normal. Left Atrium: Left atrial size was normal in size. Right Atrium: Right atrial size was normal in size. Pericardium: There is no evidence of pericardial effusion. Mitral Valve: The mitral valve is normal in structure. Trivial mitral valve regurgitation. No evidence of mitral valve stenosis. Tricuspid Valve: The tricuspid valve is normal in structure. Tricuspid valve regurgitation is trivial. No evidence of tricuspid stenosis. Aortic Valve: The aortic valve is grossly normal. Aortic valve regurgitation is moderate. Aortic regurgitation PHT measures 325 msec. Pulmonic Valve: The pulmonic valve was not well visualized. Pulmonic valve regurgitation is not visualized. No evidence of pulmonic stenosis. Aorta: The aortic root, ascending aorta and aortic arch are all structurally normal, with no evidence of dilitation or obstruction. Venous: IVC assessment for right atrial pressure unable to be performed due to mechanical ventilation. IAS/Shunts: The atrial septum is grossly normal.  LEFT VENTRICLE PLAX 2D LVIDd:         4.90 cm   Diastology LVIDs:         3.20 cm  LV e' medial:    8.38 cm/s LV PW:         0.80 cm  LV E/e' medial:  11.7 LV IVS:        0.90 cm  LV e' lateral:   10.80 cm/s LVOT diam:     1.90 cm  LV E/e' lateral: 9.1 LV SV:         52 LV SV Index:   26 LVOT Area:     2.84 cm  RIGHT VENTRICLE RV S prime:     18.80 cm/s TAPSE (M-mode): 1.8 cm LEFT ATRIUM             Index       RIGHT ATRIUM          Index LA diam:        2.80 cm 1.42 cm/m  RA Area:     8.38 cm LA Vol (A2C):   41.3 ml 20.92 ml/m RA Volume:   14.10 ml 7.14 ml/m LA Vol (A4C):   23.9 ml 12.11 ml/m LA Biplane Vol: 32.1 ml 16.26 ml/m  AORTIC VALVE LVOT Vmax:   115.00 cm/s LVOT Vmean:  77.600 cm/s LVOT VTI:    0.182 m AI PHT:      325 msec  AORTA Ao Root diam: 2.70 cm MITRAL VALVE MV Area (PHT): 3.08 cm     SHUNTS MV Decel Time: 246 msec     Systemic VTI:  0.18 m MV E velocity: 98.30 cm/s   Systemic Diam: 1.90 cm MV A velocity: 126.00 cm/s MV E/A ratio:  0.78 Buford Dresser MD Electronically signed by Buford Dresser MD Signature Date/Time: 06/09/2020/3:25:05 PM    Final      Medications:   .  prismasol BGK 4/2.5 500 mL/hr at 06/10/20 0746  .  prismasol BGK 4/2.5 500 mL/hr at 06/10/20 0749  . sodium chloride Stopped (06/08/20 1502)  . calcium gluconate    . feeding supplement (VITAL AF 1.2 CAL)    . midazolam Stopped (06/10/20 1144)  . norepinephrine (LEVOPHED) Adult infusion 30 mcg/min (06/10/20 1300)  . phenylephrine (NEO-SYNEPHRINE) Adult infusion Stopped (06/10/20 1111)  . piperacillin-tazobactam Stopped (06/10/20 1250)  . prismasol BGK 4/2.5 2,000 mL/hr at 06/10/20 1252  . sodium chloride    . vasopressin 0.03 Units/min (06/10/20 1300)   . B-complex with vitamin C  1 tablet Per Tube Daily  . chlorhexidine gluconate (MEDLINE KIT)  15 mL Mouth Rinse BID  . Chlorhexidine Gluconate  Cloth  6 each Topical Daily  . feeding supplement (PROSource TF)  45 mL Per Tube Daily  . heparin injection (subcutaneous)  5,000 Units  Subcutaneous Q8H  . insulin aspart  0-9 Units Subcutaneous Q4H  . mouth rinse  15 mL Mouth Rinse 10 times per day  . mupirocin ointment  1 application Nasal BID  . pantoprazole (PROTONIX) IV  40 mg Intravenous Q24H  . sodium chloride flush  10-40 mL Intracatheter Q12H   docusate, fentaNYL (SUBLIMAZE) injection, heparin, heparin, midazolam, polyethylene glycol, sodium chloride, sodium chloride, sodium chloride flush  Assessment/ Plan:  1.Acute kidney Injury: Normal creatinine at baseline but fluctuating in history of AKI.  Severe AKI with creatinine of 25 on arrival likely ATN.  No signs of hydronephrosis or glomerular disease.  Mild elevation in CPK possibly contributing.  Continue CRRT for now.  There is potential for renal recovery if her clinical status improves overall but no signs at this time. 2. Shock/hypotension. Considering cardiogenic and septic   Antibiotics and cultures per primary Service. Pressor support to maintain MAP 3. VDRF - recent mild covid   CXR  Non revealing   As per critical care 4. Unresponsiveness  CT head and drug screen and airway management per CCM. 5. Metabolic Acidosis    appears to be resolved.  4K/2.5 calcium bath's 6. Hyperkalemia   resolved. With CRRT   LOS: 2 Reesa Chew @TODAY @1 :13 PM

## 2020-06-11 ENCOUNTER — Other Ambulatory Visit: Payer: Self-pay

## 2020-06-11 ENCOUNTER — Inpatient Hospital Stay (HOSPITAL_COMMUNITY): Payer: Medicare Other

## 2020-06-11 DIAGNOSIS — J9601 Acute respiratory failure with hypoxia: Secondary | ICD-10-CM | POA: Diagnosis not present

## 2020-06-11 DIAGNOSIS — J690 Pneumonitis due to inhalation of food and vomit: Secondary | ICD-10-CM | POA: Diagnosis not present

## 2020-06-11 LAB — CBC
HCT: 30.1 % — ABNORMAL LOW (ref 36.0–46.0)
Hemoglobin: 9.8 g/dL — ABNORMAL LOW (ref 12.0–15.0)
MCH: 30.7 pg (ref 26.0–34.0)
MCHC: 32.6 g/dL (ref 30.0–36.0)
MCV: 94.4 fL (ref 80.0–100.0)
Platelets: 138 10*3/uL — ABNORMAL LOW (ref 150–400)
RBC: 3.19 MIL/uL — ABNORMAL LOW (ref 3.87–5.11)
RDW: 14.4 % (ref 11.5–15.5)
WBC: 13.5 10*3/uL — ABNORMAL HIGH (ref 4.0–10.5)
nRBC: 0 % (ref 0.0–0.2)

## 2020-06-11 LAB — RENAL FUNCTION PANEL
Albumin: 2.5 g/dL — ABNORMAL LOW (ref 3.5–5.0)
Albumin: 2.4 g/dL — ABNORMAL LOW (ref 3.5–5.0)
Anion gap: 12 (ref 5–15)
Anion gap: 11 (ref 5–15)
BUN: 6 mg/dL (ref 6–20)
BUN: 10 mg/dL (ref 6–20)
CO2: 22 mmol/L (ref 22–32)
CO2: 23 mmol/L (ref 22–32)
Calcium: 8.1 mg/dL — ABNORMAL LOW (ref 8.9–10.3)
Calcium: 7.8 mg/dL — ABNORMAL LOW (ref 8.9–10.3)
Chloride: 103 mmol/L (ref 98–111)
Chloride: 102 mmol/L (ref 98–111)
Creatinine, Ser: 1.33 mg/dL — ABNORMAL HIGH (ref 0.44–1.00)
Creatinine, Ser: 1.48 mg/dL — ABNORMAL HIGH (ref 0.44–1.00)
GFR, Estimated: 49 mL/min — ABNORMAL LOW (ref >60)
GFR, Estimated: 43 mL/min — ABNORMAL LOW (ref >60)
Glucose, Bld: 141 mg/dL — ABNORMAL HIGH (ref 70–99)
Glucose, Bld: 166 mg/dL — ABNORMAL HIGH (ref 70–99)
Phosphorus: <1 mg/dL — CL (ref 2.5–4.6)
Phosphorus: 1 mg/dL — CL (ref 2.5–4.6)
Potassium: 4 mmol/L (ref 3.5–5.1)
Potassium: 4.1 mmol/L (ref 3.5–5.1)
Sodium: 137 mmol/L (ref 135–145)
Sodium: 136 mmol/L (ref 135–145)

## 2020-06-11 LAB — GLUCOSE, CAPILLARY
Glucose-Capillary: 139 mg/dL — ABNORMAL HIGH (ref 70–99)
Glucose-Capillary: 160 mg/dL — ABNORMAL HIGH (ref 70–99)
Glucose-Capillary: 154 mg/dL — ABNORMAL HIGH (ref 70–99)
Glucose-Capillary: 131 mg/dL — ABNORMAL HIGH (ref 70–99)
Glucose-Capillary: 181 mg/dL — ABNORMAL HIGH (ref 70–99)
Glucose-Capillary: 207 mg/dL — ABNORMAL HIGH (ref 70–99)

## 2020-06-11 LAB — MAGNESIUM: Magnesium: 2.4 mg/dL (ref 1.7–2.4)

## 2020-06-11 LAB — PHOSPHORUS: Phosphorus: <1 mg/dL — CL (ref 2.5–4.6)

## 2020-06-11 MED ORDER — PANTOPRAZOLE SODIUM 40 MG PO PACK
40.0000 mg | PACK | ORAL | Status: DC
  Administered 2020-06-11 – 2020-06-23 (×12): 40 mg
  Filled 2020-06-11 (×15): qty 20

## 2020-06-11 MED ORDER — POTASSIUM PHOSPHATES 15 MMOLE/5ML IV SOLN
30.0000 mmol | Freq: Once | INTRAVENOUS | Status: AC
  Administered 2020-06-11: 30 mmol via INTRAVENOUS
  Filled 2020-06-11: qty 10

## 2020-06-11 MED ORDER — SODIUM PHOSPHATES 45 MMOLE/15ML IV SOLN
10.0000 mmol | Freq: Once | INTRAVENOUS | Status: AC
  Administered 2020-06-11: 10 mmol via INTRAVENOUS
  Filled 2020-06-11: qty 3.33

## 2020-06-11 MED ORDER — PRISMASOL BGK 4/2.5 32-4-2.5 MEQ/L REPLACEMENT SOLN
Status: DC
  Filled 2020-06-11 (×9): qty 5000

## 2020-06-11 NOTE — Plan of Care (Signed)
  Problem: Safety: Goal: Non-violent Restraint(s) Outcome: Completed/Met   Problem: Education: Goal: Knowledge of General Education information will improve Description: Including pain rating scale, medication(s)/side effects and non-pharmacologic comfort measures Outcome: Not Progressing   Problem: Health Behavior/Discharge Planning: Goal: Ability to manage health-related needs will improve Outcome: Not Progressing   Problem: Clinical Measurements: Goal: Ability to maintain clinical measurements within normal limits will improve Outcome: Progressing Goal: Will remain free from infection Outcome: Progressing Goal: Diagnostic test results will improve Outcome: Progressing Goal: Respiratory complications will improve Outcome: Progressing Goal: Cardiovascular complication will be avoided Outcome: Progressing   Problem: Activity: Goal: Risk for activity intolerance will decrease Outcome: Not Progressing   Problem: Nutrition: Goal: Adequate nutrition will be maintained Outcome: Progressing   Problem: Coping: Goal: Level of anxiety will decrease Outcome: Progressing   Problem: Elimination: Goal: Will not experience complications related to bowel motility Outcome: Not Progressing   Problem: Safety: Goal: Ability to remain free from injury will improve Outcome: Progressing   Problem: Skin Integrity: Goal: Risk for impaired skin integrity will decrease Outcome: Progressing

## 2020-06-11 NOTE — Progress Notes (Signed)
Date and time results received: 06/11/20   1727  Test: Phosphorus  Critical Value: 1  Name of Provider Notified: Dr. Halford Chessman  Orders Received? Or Actions Taken?: Orders Received - See Orders for details  Phosphorus Replaced  Rayann Jolley, Zenovia Jordan, RN

## 2020-06-11 NOTE — Progress Notes (Signed)
[facility] ROUNDING NOTE   Subjective:   Brief History: 50 y.o. female. With a significant history of hypertension, endometriosis and depression that was seen in the IR 05/10/20 for abdominal pain and was found to have Covid 19 infection   She was mildly symptomatic and was discharged home on 05/12/20. She apparently fell out of bed during the night and was found to be in asystole and she was intubated and underwent chest compressions as well as EPI with ROSC. Thought that cardiac arrest was related to opioid overdose. AKI now requiring CRRT starting 2/16 likely 2/2 ATN  In the ER, she was seen by Dr Gwenlyn Found, Cardiology and 2 D Echo performed   She had a preserved ejection fraction and no wall motion abnormality. Her blood pressure was 58/33  Pulse was 87 and she was hypothermic at 92 degrees.  Interval History: Continue to require pressors. Overall improved from yesterday. Significant hypophosphatemia requiring repletion. Essentially no urine output.  Objective:  Vital signs in last 24 hours:  Temp:  [94.2 F (34.6 C)-99.1 F (37.3 C)] 97.4 F (36.3 C) (02/19 0800) Pulse Rate:  [73-97] 97 (02/19 1030) Resp:  [15-25] 22 (02/19 1030) BP: (117-150)/(54-67) 133/64 (02/19 0817) SpO2:  [10 %-100 %] 100 % (02/19 1030) Arterial Line BP: (108-155)/(46-74) 125/58 (02/19 1030) FiO2 (%):  [30 %-40 %] 30 % (02/19 0817) Weight:  [88.1 kg] 88.1 kg (02/19 0319)  Weight change: -1.4 kg Filed Weights   06/09/20 0428 06/10/20 0500 06/11/20 0319  Weight: 88 kg 89.5 kg 88.1 kg    Intake/Output: I/O last 3 completed shifts: In: 3244.7 [I.V.:2432.2; Other:20; NG/GT:411; IV Piggyback:381.5] Out: 2860 [Urine:25; Other:2835]   Intake/Output this shift:  Total I/O In: 328.8 [I.V.:82.2; NG/GT:120; IV Piggyback:126.5] Out: 361 [Other:361]  Intubated and sedated CVS-regular rate and rhythm RS-mechanically supported breath sounds ABD- BS present soft non-distended EXT-no edema in  bilateral lower extremities   Basic Metabolic Panel: Recent Labs  Lab 06/08/20 1847 06/08/20 1944 06/09/20 0440 06/09/20 0446 06/09/20 1730 06/10/20 0359 06/10/20 0405 06/10/20 1637 06/11/20 0203 06/11/20 0306  NA  --    < > 138   < > 135 135 136 136 137  --   K  --    < > 4.0   < > 4.2 4.3 4.4 4.2 4.0  --   CL  --    < > 95*  --  100 101  --  101 103  --   CO2  --    < > 21*  --  19* 19*  --  21* 22  --   GLUCOSE  --    < > 73  --  107* 105*  --  104* 141*  --   BUN  --    < > 100*  --  39* 17  --  7 6  --   CREATININE  --    < > 10.14*  --  3.76* 2.13*  --  1.53* 1.33*  --   CALCIUM  --    < > 7.4*  --  7.8* 8.2*  --  8.4* 8.1*  --   MG 2.7*  --  2.1  --   --  2.4  --  2.6*  --   --   PHOS  --    < > 5.1*  --  2.9 2.4*  --  1.9* <1.0* <1.0*   < > = values in this interval not displayed.    Liver Function Tests:  Recent Labs  Lab 06/08/20 1423 06/08/20 1944 06/09/20 0440 06/09/20 1730 06/10/20 0359 06/10/20 1637 06/11/20 0203  AST 93*  --  172*  --   --   --   --   ALT 26  --  41  --   --   --   --   ALKPHOS 109  --  107  --   --   --   --   BILITOT 0.9  --  1.1  --   --   --   --   PROT 6.6  --  6.3*  --   --   --   --   ALBUMIN 2.4*   < > 2.7* 2.4* 2.5* 2.7* 2.5*   < > = values in this interval not displayed.   No results for input(s): LIPASE, AMYLASE in the last 168 hours. No results for input(s): AMMONIA in the last 168 hours.  CBC: Recent Labs  Lab 06/08/20 1423 06/08/20 1529 06/09/20 0446 06/09/20 0615 06/10/20 0359 06/10/20 0405 06/11/20 0306  WBC 16.2*  --   --  14.0* 17.6*  --  13.5*  HGB 10.6*   < > 11.2* 11.2* 11.2* 11.2* 9.8*  HCT 33.2*   < > 33.0* 32.0* 32.9* 33.0* 30.1*  MCV 98.8  --   --  87.0 90.9  --  94.4  PLT 276  --   --  224 185  --  138*   < > = values in this interval not displayed.    Cardiac Enzymes: Recent Labs  Lab 06/08/20 1847  CKTOTAL 5,564*    BNP: Invalid input(s): POCBNP  CBG: Recent Labs  Lab  06/10/20 1524 06/10/20 1930 06/10/20 2308 06/11/20 0309 06/11/20 0806  GLUCAP 104* 139* 131* 139* 160*    Microbiology: Results for orders placed or performed during the hospital encounter of 06/08/20  MRSA PCR Screening     Status: Abnormal   Collection Time: 06/08/20  8:44 PM   Specimen: Nasopharyngeal  Result Value Ref Range Status   MRSA by PCR POSITIVE (A) NEGATIVE Final    Comment:        The GeneXpert MRSA Assay (FDA approved for NASAL specimens only), is one component of a comprehensive MRSA colonization surveillance program. It is not intended to diagnose MRSA infection nor to guide or monitor treatment for MRSA infections. RESULT CALLED TO, READ BACK BY AND VERIFIED WITH: Ferrel Logan 06/08/20 2359 JDW Performed at [facility], [address], [place], [place] [address]   Blood culture (routine x 2)     Status: None (Preliminary result)   Collection Time: 06/08/20  9:25 PM   Specimen: BLOOD  Result Value Ref Range Status   Specimen Description BLOOD HEMODIALYSIS CATHETER  Final   Special Requests   Final    BOTTLES DRAWN AEROBIC AND ANAEROBIC Blood Culture results may not be optimal due to an excessive volume of blood received in culture bottles   Culture   Final    NO GROWTH 3 DAYS Performed at [facility], [place], [place], [place] [address]    Report Status PENDING  Incomplete  Blood culture (routine x 2)     Status: None (Preliminary result)   Collection Time: 06/08/20  9:25 PM   Specimen: BLOOD LEFT WRIST  Result Value Ref Range Status   Specimen Description BLOOD LEFT WRIST  Final   Special Requests   Final    BOTTLES DRAWN AEROBIC ONLY Blood Culture  adequate volume   Culture   Final    NO GROWTH 2 DAYS Performed at [facility], [address], [place],  [address]    Report Status PENDING  Incomplete    Coagulation Studies: Recent Labs    06/08/20 1847  LABPROT 19.9*  INR 1.8*    Urinalysis: Recent Labs     06/08/20 1512  COLORURINE AMBER*  LABSPEC 1.023  PHURINE 5.0  GLUCOSEU NEGATIVE  HGBUR NEGATIVE  BILIRUBINUR NEGATIVE  KETONESUR NEGATIVE  PROTEINUR NEGATIVE  NITRITE NEGATIVE  LEUKOCYTESUR NEGATIVE      Imaging: DG Chest Port 1 View  Result Date: 06/11/2020 CLINICAL DATA:  Respiratory failure. EXAM: PORTABLE CHEST 1 VIEW COMPARISON:  06/10/2020 FINDINGS: Left IJ catheter tip is at the cavoatrial junction. Right IJ catheter tip is in the distal SVC. ETT tip is just above the carina. Feeding tube tip is well below the GE junction and is either in the distal stomach or proximal duodenum. Stable cardiomediastinal contours. Low lung volumes with asymmetric elevation of the right hemidiaphragm. Atelectasis is noted in the right lung base. IMPRESSION: Low lung volumes with asymmetric elevation of the right hemidiaphragm and right base atelectasis. Electronically Signed   By: Kerby Moors M.D.   On: 06/11/2020 07:40   DG Chest Port 1 View  Result Date: 06/10/2020 CLINICAL DATA:  Respiratory failure EXAM: PORTABLE CHEST 1 VIEW COMPARISON:  06/09/2020 FINDINGS: Support devices are stable. Patient is rotated to the right, limiting study. Cardiomegaly. Bibasilar atelectasis, right greater than left. No visible effusions or acute bony abnormality. IMPRESSION: Limited study due to rotation. Bibasilar atelectasis. Support devices stable. Electronically Signed   By: Rolm Baptise M.D.   On: 06/10/2020 07:19     Medications:   .  prismasol BGK 4/2.5 500 mL/hr at 06/11/20 0423  .  prismasol BGK 4/2.5 500 mL/hr at 06/11/20 1042  . sodium chloride 10 mL/hr at 06/11/20 1000  . calcium gluconate    . feeding supplement (VITAL AF 1.2 CAL) 40 mL/hr at 06/11/20 0600  . midazolam Stopped (06/11/20 0737)  . norepinephrine (LEVOPHED) Adult infusion 7 mcg/min (06/11/20 1000)  . phenylephrine (NEO-SYNEPHRINE) Adult infusion Stopped (06/10/20 1111)  . piperacillin-tazobactam Stopped (06/11/20 4034)  .  prismasol BGK 4/2.5 1,000 mL/hr at 06/11/20 0756  . sodium chloride    . sodium phosphate  Dextrose 5% IVPB 42 mL/hr at 06/11/20 1000  . vasopressin 0.03 Units/min (06/11/20 1000)   . B-complex with vitamin C  1 tablet Per Tube Daily  . chlorhexidine gluconate (MEDLINE KIT)  15 mL Mouth Rinse BID  . Chlorhexidine Gluconate Cloth  6 each Topical Daily  . feeding supplement (PROSource TF)  45 mL Per Tube Daily  . heparin injection (subcutaneous)  5,000 Units Subcutaneous Q8H  . insulin aspart  0-9 Units Subcutaneous Q4H  . mouth rinse  15 mL Mouth Rinse 10 times per day  . mupirocin ointment  1 application Nasal BID  . pantoprazole sodium  40 mg Per Tube Q24H  . sodium chloride flush  10-40 mL Intracatheter Q12H   docusate, fentaNYL (SUBLIMAZE) injection, heparin, midazolam, polyethylene glycol, sodium chloride, sodium chloride flush  Assessment/ Plan:  1.Acute kidney Injury: Normal creatinine at baseline but fluctuating in history of AKI.  Severe AKI with creatinine of 25 on arrival likely ATN.  No signs of hydronephrosis or glomerular disease.  Mild elevation in CPK possibly contributing.  Continue CRRT for now.  There is potential for renal recovery if her clinical  status improves overall but no signs at this time. Because of hypophosphatemia and good clearance lower DFR to 1000cc/hr 2. Shock/hypotension. Considering cardiogenic and septic   Antibiotics and cultures per primary Service. Pressor support to maintain MAP 3. VDRF - recent mild covid    continue ventilatory management per primary team 4. Unresponsiveness  CT head and drug screen and airway management per CCM. 5. Hypophosphatemia: Significantly low likely related to CRRT. Continue ongoing repletion. 6. Hyperkalemia   resolved. With CRRT   LOS: 3 Reesa Chew @TODAY @11 :09 AM

## 2020-06-11 NOTE — Progress Notes (Addendum)
eLink Physician-Brief Progress Note Patient Name: ESTELLA MALATESTA DOB: 06-23-1970 MRN: [id number]   Date of Service  06/11/2020  HPI/Events of Note  Phosphorus < 1  eICU Interventions  Clinical pharmacist requested to order 10 mmol of Sodium phosphate x 1.        Kerry Kass Aristeo Hankerson 06/11/2020, 5:21 AM

## 2020-06-11 NOTE — Progress Notes (Signed)
NAME:  [name], MRN:  [id number], DOB:  07-11-1970, LOS: 3 ADMISSION DATE:  06/08/2020, CONSULTATION DATE:  06/08/2020 REFERRING MD:  Dr. Karle Starch, ER, CHIEF COMPLAINT:  Cardiac arrest   Brief History:  50 yo female with hx of opiate use, PTSD, and depression was laying on the floor for several hours.  Found by EMS to have agonal respiratory pattern and then asystole.  Had [facility] airway placed and received epinephrine with 3 minutes of CPR before ROSC achieved.  In ER found to have severe acidosis, hypokalemia, acute renal failure, hypothermia, and shock.  UDS positive for amphetamines.  Past Medical History:  PTSD, Depression, HTN, HLD, Headache, Opiate dependence, Back pain, Anxiety, Osteoarthritis, COVID 19 infection January 2022  Significant Hospital Events:  2/16 admit, start CRRT  Consults:  Nephrology  Procedures:  ETT 2/16 >> Rt IJ HD cath 2/16 >> Lt IJ CVL 2/16 >> Rt femoral arterial line 2/18 >>    Significant Diagnostic Tests:   CT head 2/16 >> normal brain   Renal u/s 2/16 >> unremarkable  Echo 2/17 >> EF 55-60%.  Micro Data:  Blood 2/16 >> Sputum 2/16 >> MRSA PCR 2/16 >> positive  Antimicrobials:  Zosyn 2/16 >>    Interim History / Subjective:  Remains on pressors, CRRT, vent.  Objective   Blood pressure (!) 123/58, pulse 84, temperature 98.1 F (36.7 C), temperature source Oral, resp. rate 20, height 5\' 6"  (1.676 m), weight 88.1 kg, last menstrual period 06/03/2018, SpO2 100 %.    Vent Mode: PRVC FiO2 (%):  [40 %] 40 % Set Rate:  [20 bmp] 20 bmp Vt Set:  [470 mL] 470 mL PEEP:  [5 cmH20] 5 cmH20 Pressure Support:  [10 cmH20] 10 cmH20 Plateau Pressure:  [17 cmH20] 17 cmH20   Intake/Output Summary (Last 24 hours) at 06/11/2020 0800 Last data filed at 06/11/2020 0700 Gross per 24 hour  Intake 1846.23 ml  Output 2025 ml  Net -178.77 ml   Filed Weights   06/09/20 0428 06/10/20 0500 06/11/20 0319  Weight: 88 kg 89.5 kg 88.1 kg     Examination:  General - sedated Eyes - pupils reactive ENT - ETT in place Cardiac - regular rate/rhythm, no murmur Chest - decreased BS at bases, no wheeze Abdomen - soft, non tender, + bowel sounds Extremities - 1+ edema Skin - improved circulation to toes Neuro - RASS -2  Resolved Hospital Problem list   Anion gap metabolic acidosis with lactic acidosis, Hyperkalemia with ECG changes, Cardiac arrest with asystole from opiate/amphetamine OD, Hypothermia, Hypocalcemia, Rhabdomyolysis  Assessment & Plan:   Septic shock from aspiration pneumonitis. - wean pressors to keep MAP > 65 - day 4/7 of zosyn  Acute hypoxic respiratory failure with compromised airway in setting of cardiac arrest and aspiration pneumonitis. - pressure support wean as able - f/u CXR intermittently  Acute metabolic encephalopathy 2nd to hypoxia, renal failure, sepsis. Hx of depression, PTSD, anxiety, chronic opiate use. - RASS goal 0 - hold outpt prozac, lamictal, risperdal, zanaflex, trazodone.  AKI from ATN in setting of shock and hypoxia. Hypophosphatemia. - CRRT per nephrology - f/u BMET, electrolytes  Hx of HTN. - hold outpt HCTZ, lisinopril.  Hyperglycemia. - HbA1C 6.1 from 2/16 - SSI  Anemia of critical illness. - f/u CBC - transfuse for Hb < 7 or significant bleeding  Best practice (evaluated daily)  Diet: tube feeds DVT prophylaxis: SQ heparin GI prophylaxis: protonix Mobility: bed rest Disposition: ICU  Goals of Care:  Last date of multidisciplinary goals of care discussion: pending Family and staff present: pending Summary of discussion: pending Follow up goals of care discussion due: pending, attempting to contact family Code Status: ICU  Labs:   CMP Latest Ref Rng & Units 06/11/2020 06/10/2020 06/10/2020  Glucose 70 - 99 mg/dL 141(H) 104(H) -  BUN 6 - 20 mg/dL 6 7 -  Creatinine 0.44 - 1.00 mg/dL 1.33(H) 1.53(H) -  Sodium 135 - 145 mmol/L 137 136 136  Potassium 3.5 -  5.1 mmol/L 4.0 4.2 4.4  Chloride 98 - 111 mmol/L 103 101 -  CO2 22 - 32 mmol/L 22 21(L) -  Calcium 8.9 - 10.3 mg/dL 8.1(L) 8.4(L) -  Total Protein 6.5 - 8.1 g/dL - - -  Total Bilirubin 0.3 - 1.2 mg/dL - - -  Alkaline Phos 38 - 126 U/L - - -  AST 15 - 41 U/L - - -  ALT 0 - 44 U/L - - -    CBC Latest Ref Rng & Units 06/11/2020 06/10/2020 06/10/2020  WBC 4.0 - 10.5 K/uL 13.5(H) - 17.6(H)  Hemoglobin 12.0 - 15.0 g/dL 9.8(L) 11.2(L) 11.2(L)  Hematocrit 36.0 - 46.0 % 30.1(L) 33.0(L) 32.9(L)  Platelets 150 - 400 K/uL 138(L) - 185    ABG    Component Value Date/Time   PHART 7.485 (H) 06/10/2020 0405   PCO2ART 31.9 (L) 06/10/2020 0405   PO2ART 71 (L) 06/10/2020 0405   HCO3 24.2 06/10/2020 0405   TCO2 25 06/10/2020 0405   ACIDBASEDEF 10.0 (H) 06/08/2020 2238   O2SAT 96.0 06/10/2020 0405    CBG (last 3)  Recent Labs    06/10/20 2308 06/11/20 0309 06/11/20 0806  GLUCAP 131* 139* 160*    Critical care time: 33 minutes  Chesley Mires, MD [place] Pager - [phone] 06/11/2020, 8:13 AM

## 2020-06-12 DIAGNOSIS — J9601 Acute respiratory failure with hypoxia: Secondary | ICD-10-CM | POA: Diagnosis not present

## 2020-06-12 DIAGNOSIS — J690 Pneumonitis due to inhalation of food and vomit: Secondary | ICD-10-CM | POA: Diagnosis not present

## 2020-06-12 LAB — RENAL FUNCTION PANEL
Albumin: 2.3 g/dL — ABNORMAL LOW (ref 3.5–5.0)
Albumin: 2.4 g/dL — ABNORMAL LOW (ref 3.5–5.0)
Anion gap: 10 (ref 5–15)
Anion gap: 10 (ref 5–15)
BUN: 13 mg/dL (ref 6–20)
BUN: 18 mg/dL (ref 6–20)
CO2: 23 mmol/L (ref 22–32)
CO2: 26 mmol/L (ref 22–32)
Calcium: 7.6 mg/dL — ABNORMAL LOW (ref 8.9–10.3)
Calcium: 7.9 mg/dL — ABNORMAL LOW (ref 8.9–10.3)
Chloride: 101 mmol/L (ref 98–111)
Chloride: 100 mmol/L (ref 98–111)
Creatinine, Ser: 1.48 mg/dL — ABNORMAL HIGH (ref 0.44–1.00)
Creatinine, Ser: 1.46 mg/dL — ABNORMAL HIGH (ref 0.44–1.00)
GFR, Estimated: 43 mL/min — ABNORMAL LOW (ref >60)
GFR, Estimated: 44 mL/min — ABNORMAL LOW (ref >60)
Glucose, Bld: 232 mg/dL — ABNORMAL HIGH (ref 70–99)
Glucose, Bld: 184 mg/dL — ABNORMAL HIGH (ref 70–99)
Phosphorus: 1.5 mg/dL — ABNORMAL LOW (ref 2.5–4.6)
Phosphorus: 2.5 mg/dL (ref 2.5–4.6)
Potassium: 4 mmol/L (ref 3.5–5.1)
Potassium: 4.1 mmol/L (ref 3.5–5.1)
Sodium: 134 mmol/L — ABNORMAL LOW (ref 135–145)
Sodium: 136 mmol/L (ref 135–145)

## 2020-06-12 LAB — MAGNESIUM: Magnesium: 2.2 mg/dL (ref 1.7–2.4)

## 2020-06-12 LAB — CBC
HCT: 29 % — ABNORMAL LOW (ref 36.0–46.0)
Hemoglobin: 9.3 g/dL — ABNORMAL LOW (ref 12.0–15.0)
MCH: 30.4 pg (ref 26.0–34.0)
MCHC: 32.1 g/dL (ref 30.0–36.0)
MCV: 94.8 fL (ref 80.0–100.0)
Platelets: 112 10*3/uL — ABNORMAL LOW (ref 150–400)
RBC: 3.06 MIL/uL — ABNORMAL LOW (ref 3.87–5.11)
RDW: 14.4 % (ref 11.5–15.5)
WBC: 10.1 10*3/uL (ref 4.0–10.5)
nRBC: 0 % (ref 0.0–0.2)

## 2020-06-12 LAB — GLUCOSE, CAPILLARY
Glucose-Capillary: 214 mg/dL — ABNORMAL HIGH (ref 70–99)
Glucose-Capillary: 215 mg/dL — ABNORMAL HIGH (ref 70–99)
Glucose-Capillary: 224 mg/dL — ABNORMAL HIGH (ref 70–99)
Glucose-Capillary: 156 mg/dL — ABNORMAL HIGH (ref 70–99)
Glucose-Capillary: 176 mg/dL — ABNORMAL HIGH (ref 70–99)
Glucose-Capillary: 171 mg/dL — ABNORMAL HIGH (ref 70–99)

## 2020-06-12 MED ORDER — FLUOXETINE HCL 20 MG PO CAPS
40.0000 mg | ORAL_CAPSULE | Freq: Every day | ORAL | Status: DC
  Administered 2020-06-12 – 2020-06-23 (×11): 40 mg
  Filled 2020-06-12 (×11): qty 2

## 2020-06-12 MED ORDER — POTASSIUM PHOSPHATES 15 MMOLE/5ML IV SOLN: 30.0000 mmol | Freq: Once | INTRAVENOUS | Status: DC

## 2020-06-12 MED ORDER — ORAL CARE MOUTH RINSE
15.0000 mL | Freq: Two times a day (BID) | OROMUCOSAL | Status: DC
  Administered 2020-06-13 – 2020-06-23 (×15): 15 mL via OROMUCOSAL

## 2020-06-12 MED ORDER — FLUOXETINE HCL 20 MG PO CAPS
40.0000 mg | ORAL_CAPSULE | Freq: Every day | ORAL | Status: DC
  Filled 2020-06-12: qty 2

## 2020-06-12 MED ORDER — SODIUM PHOSPHATES 45 MMOLE/15ML IV SOLN
20.0000 mmol | Freq: Once | INTRAVENOUS | Status: DC
  Filled 2020-06-12: qty 6.67

## 2020-06-12 MED ORDER — SODIUM PHOSPHATES 45 MMOLE/15ML IV SOLN
30.0000 mmol | Freq: Once | INTRAVENOUS | Status: AC
  Administered 2020-06-12: 30 mmol via INTRAVENOUS
  Filled 2020-06-12: qty 10

## 2020-06-12 MED ORDER — CHLORHEXIDINE GLUCONATE 0.12 % MT SOLN
15.0000 mL | Freq: Two times a day (BID) | OROMUCOSAL | Status: DC
  Administered 2020-06-13 – 2020-06-23 (×17): 15 mL via OROMUCOSAL
  Filled 2020-06-12 (×15): qty 15

## 2020-06-12 NOTE — Progress Notes (Addendum)
[facility] Progress Note Patient Name: [name] DOB: Sep 07, 1970 MRN: [id number]   Date of Service  06/12/2020  HPI/Events of Note  Phosphate = 1.5. Patient on CRRT.  eICU Interventions  Spoke with pharmacy at [facility]. They will order Sodium Phosphate 30 mmol as replacement.      Intervention Category Major Interventions: Electrolyte abnormality - evaluation and management  [name] [place] 06/12/2020, 5:13 AM

## 2020-06-12 NOTE — Progress Notes (Signed)
NAME:  [name], MRN:  [id number], DOB:  02/08/1971, LOS: 4 ADMISSION DATE:  06/08/2020, CONSULTATION DATE:  06/08/2020 REFERRING MD:  Dr. Karle Starch, ER, CHIEF COMPLAINT:  Cardiac arrest   Brief History:  50 yo female with hx of opiate use, PTSD, and depression was laying on the floor for several hours.  Found by EMS to have agonal respiratory pattern and then asystole.  Had [facility] airway placed and received epinephrine with 3 minutes of CPR before ROSC achieved.  In ER found to have severe acidosis, hypokalemia, acute renal failure, hypothermia, and shock.  UDS positive for amphetamines.  Past Medical History:  PTSD, Depression, HTN, HLD, Headache, Opiate dependence, Back pain, Anxiety, Osteoarthritis, COVID 19 infection January 2022  Significant Hospital Events:  2/16 admit, start CRRT 2/20 extubate  Consults:  Nephrology  Procedures:  ETT 2/16 >> 2/20 Rt IJ HD cath 2/16 >> Lt IJ CVL 2/16 >> Rt femoral arterial line 2/18 >>    Significant Diagnostic Tests:   CT head 2/16 >> normal brain   Renal u/s 2/16 >> unremarkable  Echo 2/17 >> EF 55-60%.  Micro Data:  Blood 2/16 >> MRSA PCR 2/16 >> positive  Antimicrobials:  Zosyn 2/16 >>    Interim History / Subjective:  Pressure support.  Remains on pressors, CRRT.  Objective   Blood pressure (!) 117/55, pulse 100, temperature 98.9 F (37.2 C), temperature source Oral, resp. rate 19, height 5\' 6"  (1.676 m), weight 88.6 kg, last menstrual period 06/03/2018, SpO2 100 %.    Vent Mode: PRVC FiO2 (%):  [30 %] 30 % Set Rate:  [20 bmp] 20 bmp Vt Set:  [470 mL] 470 mL PEEP:  [5 cmH20] 5 cmH20 Pressure Support:  [10 cmH20] 10 cmH20 Plateau Pressure:  [15 cmH20-158 cmH20] 17 cmH20   Intake/Output Summary (Last 24 hours) at 06/12/2020 0936 Last data filed at 06/12/2020 0900 Gross per 24 hour  Intake 2922.86 ml  Output 3239 ml  Net -316.14 ml   Filed Weights   06/10/20 0500 06/11/20 0319 06/12/20 0106  Weight: 89.5 kg  88.1 kg 88.6 kg    Examination:  General - sedated Eyes - pupils reactive ENT - ETT in place Cardiac - regular rate/rhythm, no murmur Chest - equal breath sounds b/l, no wheezing or rales Abdomen - soft, non tender, + bowel sounds Extremities - no cyanosis, clubbing, or edema Skin - no rashes Neuro - RASS 0   Resolved Hospital Problem list   Anion gap metabolic acidosis with lactic acidosis, Hyperkalemia with ECG changes, Cardiac arrest with asystole from opiate/amphetamine OD, Hypothermia, Hypocalcemia, Rhabdomyolysis  Assessment & Plan:   Septic shock from aspiration pneumonitis. - wean pressors to keep MAP > 65 - day 5/7 of zosyn  Acute hypoxic respiratory failure with compromised airway in setting of cardiac arrest and aspiration pneumonitis. - extubate 2/20 - goal SpO2 > 92% - bronchial hygiene - f/u CXR intermittently  Acute metabolic encephalopathy 2nd to hypoxia, renal failure, sepsis. Hx of depression, PTSD, anxiety, chronic opiate use. - resume prozac - hold outpt lamictal, risperdal, zanaflex, trazodone, xanax  AKI from ATN in setting of shock and hypoxia. Hypophosphatemia. - CRRT per nephrology - f/u BMET, electrolytes  Hx of HTN. - hold outpt HCTZ, lisinopril.  Hyperglycemia. - HbA1C 6.1 from 2/16 - SSI  Dysphagia. - continue cortrak and tube feeds for now - speech to assess swallowing  Anemia of critical illness. Mild thrombocytopenia, likely from sepsis. - f/u CBC - transfuse for Hb <  7 or significant bleeding  Best practice (evaluated daily)  Diet: tube feeds DVT prophylaxis: SQ heparin GI prophylaxis: protonix Mobility: PT/OT to assess after extubation Disposition: ICU  Goals of Care:  Last date of multidisciplinary goals of care discussion: pending Family and staff present: pending Summary of discussion: pending Follow up goals of care discussion due: pending Code Status: ICU  Labs:   CMP Latest Ref Rng & Units 06/12/2020  06/11/2020 06/11/2020  Glucose 70 - 99 mg/dL 232(H) 166(H) 141(H)  BUN 6 - 20 mg/dL 13 10 6   Creatinine 0.44 - 1.00 mg/dL 1.48(H) 1.48(H) 1.33(H)  Sodium 135 - 145 mmol/L 134(L) 136 137  Potassium 3.5 - 5.1 mmol/L 4.0 4.1 4.0  Chloride 98 - 111 mmol/L 101 102 103  CO2 22 - 32 mmol/L 23 23 22   Calcium 8.9 - 10.3 mg/dL 7.6(L) 7.8(L) 8.1(L)  Total Protein 6.5 - 8.1 g/dL - - -  Total Bilirubin 0.3 - 1.2 mg/dL - - -  Alkaline Phos 38 - 126 U/L - - -  AST 15 - 41 U/L - - -  ALT 0 - 44 U/L - - -    CBC Latest Ref Rng & Units 06/12/2020 06/11/2020 06/10/2020  WBC 4.0 - 10.5 K/uL 10.1 13.5(H) -  Hemoglobin 12.0 - 15.0 g/dL 9.3(L) 9.8(L) 11.2(L)  Hematocrit 36.0 - 46.0 % 29.0(L) 30.1(L) 33.0(L)  Platelets 150 - 400 K/uL 112(L) 138(L) -    ABG    Component Value Date/Time   PHART 7.485 (H) 06/10/2020 0405   PCO2ART 31.9 (L) 06/10/2020 0405   PO2ART 71 (L) 06/10/2020 0405   HCO3 24.2 06/10/2020 0405   TCO2 25 06/10/2020 0405   ACIDBASEDEF 10.0 (H) 06/08/2020 2238   O2SAT 96.0 06/10/2020 0405    CBG (last 3)  Recent Labs    06/11/20 2313 06/12/20 0305 06/12/20 0817  GLUCAP 207* 214* 215*    Critical care time: 32 minutes  Chesley Mires, MD [place] Pager - [phone] 06/12/2020, 9:36 AM

## 2020-06-12 NOTE — Procedures (Signed)
Extubation Procedure Note  Patient Details:   Name: [name] DOB: 1970/05/22 MRN: [id number]   Airway Documentation:    Vent end date: 06/12/20 Vent end time: 1145   Evaluation  O2 sats: stable throughout Complications: No apparent complications Patient did tolerate procedure well. Bilateral Breath Sounds: Clear,Diminished   Yes   Pt extubated to 3L [place] per MD order. Pt had positive cuff leak prior to extubation. Pt has a weak congested cough and was encouraged to use [place] to clear secretions as they are very thick and yellow. Pt not following commands priors to extubation when asked to stick out tongue or life head off bed. RT will closely monitor pt for possible re-intubation.   Jesse Sans 06/12/2020, 11:46 AM

## 2020-06-12 NOTE — Progress Notes (Signed)
[facility] ROUNDING NOTE   Subjective:   Brief History: 50 y.o. female. With a significant history of hypertension, endometriosis and depression that was seen in the IR 05/10/20 for abdominal pain and was found to have Covid 19 infection   She was mildly symptomatic and was discharged home on 05/12/20. She apparently fell out of bed during the night and was found to be in asystole and she was intubated and underwent chest compressions as well as EPI with ROSC. Thought that cardiac arrest was related to opioid overdose. AKI now requiring CRRT starting 2/16 likely 2/2 ATN  In the ER, she was seen by Dr Gwenlyn Found, Cardiology and 2 D Echo performed   She had a preserved ejection fraction and no wall motion abnormality. Her blood pressure was 58/33  Pulse was 87 and she was hypothermic at 92 degrees.  Interval History: Continue to require pressors but requirement improving.  On norepinephrine and vasopressin.  Hopeful to extubate soon.  No urine output  Objective:  Vital signs in last 24 hours:  Temp:  [98 F (36.7 C)-98.9 F (37.2 C)] 98.9 F (37.2 C) (02/20 0800) Pulse Rate:  [87-103] 96 (02/20 1000) Resp:  [17-27] 21 (02/20 1000) BP: (111-136)/(52-62) 117/55 (02/20 0738) SpO2:  [100 %] 100 % (02/20 1000) Arterial Line BP: (107-137)/(51-66) 112/54 (02/20 1000) FiO2 (%):  [30 %] 30 % (02/20 0738) Weight:  [88.6 kg] 88.6 kg (02/20 0106)  Weight change: 0.5 kg Filed Weights   06/10/20 0500 06/11/20 0319 06/12/20 0106  Weight: 89.5 kg 88.1 kg 88.6 kg    Intake/Output: I/O last 3 completed shifts: In: 3807.7 [I.V.:1041.8; NG/GT:1617.7; IV Piggyback:1148.2] Out: 3810 [Other:4315]   Intake/Output this shift:  Total I/O In: 375.6 [I.V.:65.7; NG/GT:180; IV Piggyback:129.9] Out: 366 [Other:366]  Intubated and sedated CVS-regular rate and rhythm RS-mechanically supported breath sounds ABD- BS present soft non-distended EXT-no edema in bilateral lower extremities   Basic  Metabolic Panel: Recent Labs  Lab 06/09/20 0440 06/09/20 0446 06/10/20 0359 06/10/20 0405 06/10/20 1637 06/11/20 0203 06/11/20 0306 06/11/20 1630 06/12/20 0258  NA 138   < > 135 136 136 137  --  136 134*  K 4.0   < > 4.3 4.4 4.2 4.0  --  4.1 4.0  CL 95*   < > 101  --  101 103  --  102 101  CO2 21*   < > 19*  --  21* 22  --  23 23  GLUCOSE 73   < > 105*  --  104* 141*  --  166* 232*  BUN 100*   < > 17  --  7 6  --  10 13  CREATININE 10.14*   < > 2.13*  --  1.53* 1.33*  --  1.48* 1.48*  CALCIUM 7.4*   < > 8.2*  --  8.4* 8.1*  --  7.8* 7.6*  MG 2.1  --  2.4  --  2.6*  --   --  2.4 2.2  PHOS 5.1*   < > 2.4*  --  1.9* <1.0* <1.0* 1.0* 1.5*   < > = values in this interval not displayed.    Liver Function Tests: Recent Labs  Lab 06/08/20 1423 06/08/20 1944 06/09/20 0440 06/09/20 1730 06/10/20 0359 06/10/20 1637 06/11/20 0203 06/11/20 1630 06/12/20 0258  AST 93*  --  172*  --   --   --   --   --   --   ALT 26  --  41  --   --   --   --   --   --   ALKPHOS 109  --  107  --   --   --   --   --   --   BILITOT 0.9  --  1.1  --   --   --   --   --   --   PROT 6.6  --  6.3*  --   --   --   --   --   --   ALBUMIN 2.4*   < > 2.7*   < > 2.5* 2.7* 2.5* 2.4* 2.3*   < > = values in this interval not displayed.   No results for input(s): LIPASE, AMYLASE in the last 168 hours. No results for input(s): AMMONIA in the last 168 hours.  CBC: Recent Labs  Lab 06/08/20 1423 06/08/20 1529 06/09/20 0615 06/10/20 0359 06/10/20 0405 06/11/20 0306 06/12/20 0258  WBC 16.2*  --  14.0* 17.6*  --  13.5* 10.1  HGB 10.6*   < > 11.2* 11.2* 11.2* 9.8* 9.3*  HCT 33.2*   < > 32.0* 32.9* 33.0* 30.1* 29.0*  MCV 98.8  --  87.0 90.9  --  94.4 94.8  PLT 276  --  224 185  --  138* 112*   < > = values in this interval not displayed.    Cardiac Enzymes: Recent Labs  Lab 06/08/20 1847  CKTOTAL 5,564*    BNP: Invalid input(s): POCBNP  CBG: Recent Labs  Lab 06/11/20 1533 06/11/20 1924  06/11/20 2313 06/12/20 0305 06/12/20 0817  GLUCAP 131* 181* 207* 214* 215*    Microbiology: Results for orders placed or performed during the hospital encounter of 06/08/20  MRSA PCR Screening     Status: Abnormal   Collection Time: 06/08/20  8:44 PM   Specimen: Nasopharyngeal  Result Value Ref Range Status   MRSA by PCR POSITIVE (A) NEGATIVE Final    Comment:        The GeneXpert MRSA Assay (FDA approved for NASAL specimens only), is one component of a comprehensive MRSA colonization surveillance program. It is not intended to diagnose MRSA infection nor to guide or monitor treatment for MRSA infections. RESULT CALLED TO, READ BACK BY AND VERIFIED WITH: Ferrel Logan 06/08/20 2359 JDW Performed at [facility], [address], [place], [place] [address]   Blood culture (routine x 2)     Status: None (Preliminary result)   Collection Time: 06/08/20  9:25 PM   Specimen: BLOOD  Result Value Ref Range Status   Specimen Description BLOOD HEMODIALYSIS CATHETER  Final   Special Requests   Final    BOTTLES DRAWN AEROBIC AND ANAEROBIC Blood Culture results may not be optimal due to an excessive volume of blood received in culture bottles   Culture   Final    NO GROWTH 4 DAYS Performed at [facility], [place] [address] [place], [place] [address]    Report Status PENDING  Incomplete  Blood culture (routine x 2)     Status: None (Preliminary result)   Collection Time: 06/08/20  9:25 PM   Specimen: BLOOD LEFT WRIST  Result Value Ref Range Status   Specimen Description BLOOD LEFT WRIST  Final   Special Requests   Final    BOTTLES DRAWN AEROBIC ONLY Blood Culture adequate volume   Culture   Final    NO GROWTH 3 DAYS Performed at [facility]  Lab, [address], [place], [place] [address]    Report Status PENDING  Incomplete    Coagulation Studies: No results for input(s): LABPROT, INR in the last 72 hours.  Urinalysis: No results for input(s): COLORURINE, LABSPEC,  PHURINE, GLUCOSEU, HGBUR, BILIRUBINUR, KETONESUR, PROTEINUR, UROBILINOGEN, NITRITE, LEUKOCYTESUR in the last 72 hours.  Invalid input(s): APPERANCEUR    Imaging: DG Chest Port 1 View  Result Date: 06/11/2020 CLINICAL DATA:  Respiratory failure. EXAM: PORTABLE CHEST 1 VIEW COMPARISON:  06/10/2020 FINDINGS: Left IJ catheter tip is at the cavoatrial junction. Right IJ catheter tip is in the distal SVC. ETT tip is just above the carina. Feeding tube tip is well below the GE junction and is either in the distal stomach or proximal duodenum. Stable cardiomediastinal contours. Low lung volumes with asymmetric elevation of the right hemidiaphragm. Atelectasis is noted in the right lung base. IMPRESSION: Low lung volumes with asymmetric elevation of the right hemidiaphragm and right base atelectasis. Electronically Signed   By: Kerby Moors M.D.   On: 06/11/2020 07:40     Medications:   .  prismasol BGK 4/2.5 500 mL/hr at 06/12/20 0059  .  prismasol BGK 4/2.5 500 mL/hr at 06/12/20 0058  . sodium chloride 10 mL/hr at 06/12/20 1000  . calcium gluconate    . feeding supplement (VITAL AF 1.2 CAL) 1,000 mL (06/11/20 2200)  . norepinephrine (LEVOPHED) Adult infusion 3 mcg/min (06/12/20 1000)  . phenylephrine (NEO-SYNEPHRINE) Adult infusion Stopped (06/10/20 1111)  . piperacillin-tazobactam Stopped (06/12/20 0932)  . prismasol BGK 4/2.5 1,000 mL/hr at 06/12/20 0802  . sodium chloride    . sodium phosphate  Dextrose 5% IVPB 43 mL/hr at 06/12/20 1000  . vasopressin 0.03 Units/min (06/12/20 1000)   . B-complex with vitamin C  1 tablet Per Tube Daily  . chlorhexidine gluconate (MEDLINE KIT)  15 mL Mouth Rinse BID  . Chlorhexidine Gluconate Cloth  6 each Topical Daily  . feeding supplement (PROSource TF)  45 mL Per Tube Daily  . FLUoxetine  40 mg Per Tube Daily  . heparin injection (subcutaneous)  5,000 Units Subcutaneous Q8H  . insulin aspart  0-9 Units Subcutaneous Q4H  . mouth rinse  15 mL Mouth  Rinse 10 times per day  . mupirocin ointment  1 application Nasal BID  . pantoprazole sodium  40 mg Per Tube Q24H  . sodium chloride flush  10-40 mL Intracatheter Q12H   docusate, heparin, polyethylene glycol, sodium chloride, sodium chloride flush  Assessment/ Plan:  1.Acute kidney Injury: Normal creatinine at baseline but fluctuating in history of AKI.  Severe AKI with creatinine of 25 on arrival likely ATN.  No signs of hydronephrosis or glomerular disease.  Mild elevation in CPK possibly contributing.  Continue CRRT for now.  There is potential for renal recovery if her clinical status improves overall but no signs at this time 2. Shock/hypotension. Considering cardiogenic and septic   Antibiotics and cultures per primary Service. Pressor support to maintain MAP.  Weaning at this time 3. VDRF - recent mild covid    continue ventilatory management per primary team.  Hopeful to extubate soon 4. Unresponsiveness  CT head and drug screen and airway management per CCM. 5. Hypophosphatemia: Significantly low likely related to CRRT. Continue ongoing repletion. 6. Hyperkalemia   resolved. With CRRT   LOS: 4 Reesa Chew _0 _1 :51 AM

## 2020-06-13 DIAGNOSIS — I469 Cardiac arrest, cause unspecified: Secondary | ICD-10-CM | POA: Diagnosis not present

## 2020-06-13 LAB — RENAL FUNCTION PANEL
Albumin: 2.1 g/dL — ABNORMAL LOW (ref 3.5–5.0)
Albumin: 2.2 g/dL — ABNORMAL LOW (ref 3.5–5.0)
Anion gap: 9 (ref 5–15)
Anion gap: 13 (ref 5–15)
BUN: 20 mg/dL (ref 6–20)
BUN: 36 mg/dL — ABNORMAL HIGH (ref 6–20)
CO2: 26 mmol/L (ref 22–32)
CO2: 23 mmol/L (ref 22–32)
Calcium: 7.7 mg/dL — ABNORMAL LOW (ref 8.9–10.3)
Calcium: 7.7 mg/dL — ABNORMAL LOW (ref 8.9–10.3)
Chloride: 101 mmol/L (ref 98–111)
Chloride: 100 mmol/L (ref 98–111)
Creatinine, Ser: 1.38 mg/dL — ABNORMAL HIGH (ref 0.44–1.00)
Creatinine, Ser: 2.29 mg/dL — ABNORMAL HIGH (ref 0.44–1.00)
GFR, Estimated: 47 mL/min — ABNORMAL LOW (ref >60)
GFR, Estimated: 26 mL/min — ABNORMAL LOW (ref >60)
Glucose, Bld: 186 mg/dL — ABNORMAL HIGH (ref 70–99)
Glucose, Bld: 232 mg/dL — ABNORMAL HIGH (ref 70–99)
Phosphorus: 1.8 mg/dL — ABNORMAL LOW (ref 2.5–4.6)
Phosphorus: 3.7 mg/dL (ref 2.5–4.6)
Potassium: 4 mmol/L (ref 3.5–5.1)
Potassium: 4.3 mmol/L (ref 3.5–5.1)
Sodium: 136 mmol/L (ref 135–145)
Sodium: 136 mmol/L (ref 135–145)

## 2020-06-13 LAB — CBC
HCT: 30.3 % — ABNORMAL LOW (ref 36.0–46.0)
Hemoglobin: 9.5 g/dL — ABNORMAL LOW (ref 12.0–15.0)
MCH: 30.6 pg (ref 26.0–34.0)
MCHC: 31.4 g/dL (ref 30.0–36.0)
MCV: 97.7 fL (ref 80.0–100.0)
Platelets: 103 10*3/uL — ABNORMAL LOW (ref 150–400)
RBC: 3.1 MIL/uL — ABNORMAL LOW (ref 3.87–5.11)
RDW: 14.8 % (ref 11.5–15.5)
WBC: 8.8 10*3/uL (ref 4.0–10.5)
nRBC: 0 % (ref 0.0–0.2)

## 2020-06-13 LAB — GLUCOSE, CAPILLARY
Glucose-Capillary: 170 mg/dL — ABNORMAL HIGH (ref 70–99)
Glucose-Capillary: 177 mg/dL — ABNORMAL HIGH (ref 70–99)
Glucose-Capillary: 199 mg/dL — ABNORMAL HIGH (ref 70–99)
Glucose-Capillary: 201 mg/dL — ABNORMAL HIGH (ref 70–99)
Glucose-Capillary: 217 mg/dL — ABNORMAL HIGH (ref 70–99)
Glucose-Capillary: 261 mg/dL — ABNORMAL HIGH (ref 70–99)

## 2020-06-13 LAB — CULTURE, BLOOD (ROUTINE X 2): Culture: NO GROWTH

## 2020-06-13 LAB — MAGNESIUM: Magnesium: 2.1 mg/dL (ref 1.7–2.4)

## 2020-06-13 MED ORDER — SODIUM PHOSPHATES 45 MMOLE/15ML IV SOLN
30.0000 mmol | Freq: Once | INTRAVENOUS | Status: AC
  Administered 2020-06-13: 30 mmol via INTRAVENOUS
  Filled 2020-06-13: qty 10

## 2020-06-13 MED ORDER — PIPERACILLIN-TAZOBACTAM 3.375 G IVPB
3.3750 g | Freq: Two times a day (BID) | INTRAVENOUS | Status: DC
  Administered 2020-06-13 – 2020-06-15 (×4): 3.375 g via INTRAVENOUS
  Filled 2020-06-13 (×4): qty 50

## 2020-06-13 NOTE — Progress Notes (Signed)
Inpatient Rehab Admissions Coordinator Note:   Per therapy recommendations, pt was screened for CIR candidacy by Refugio Mcconico, MS CCC-SLP. At this time, Pt. Appears to have functional decline and is a potential candidate for CIR. Will request order for rehab consult per protocol.  Please contact me with questions.   Jasten Guyette, MS, CCC-SLP Rehab Admissions Coordinator  [phone] (celll) [phone] (office)   

## 2020-06-13 NOTE — Progress Notes (Signed)
[facility] Progress Note Patient Name: [name] DOB: 1970-12-20 MRN: [id number]   Date of Service  06/13/2020  HPI/Events of Note  Multiple issues: 1. Ca++ = 7.7 which corrects to 9.22 (Normal) given albuinin = 2.1 and 2. Phosphorus = 1.8 - Patient on CRRT with K+ = 4.0.   eICU Interventions  Plan: 1. Pharmacy to enter order for Sodium Phosphate replacement.  2. No intervention indicated for Ca++.      Intervention Category Major Interventions: Electrolyte abnormality - evaluation and management  [name] 06/13/2020, 5:44 AM

## 2020-06-13 NOTE — Evaluation (Signed)
Occupational Therapy Evaluation Patient Details Name: [name] MRN: [id number] DOB: 11-28-1970 Today's Date: 06/13/2020    History of Present Illness 50 yo 2/16 after fell out of bed and family checked on her later and found unresponsive s/p arrest and CPR with intubation. CRRT 2/16-2/20. Extubated 2/20. PMhx: HTN, HLD, anxiety/depression, PTSD, Covid + 1/18, Rt ankle fx s/p ORIF, endometriosis   Clinical Impression   Pt PTA: Pt living with family and reports independence with ADL and mobility. Pt currently, limited by varying levels of arousal and decreased cognition; decreased strength and ability to care for self. Pt minA to maxA for ADL and minA to modA +2 for bed mobility and transfers. Pt very lethargic upon eval. Pt would benefit from continued OT skilled services. OT following acutely. Pt appears very motivated and would be a great candidate for CIR.   SpO2 90-95% on 3L BP supine 94/57 (68) Sitting 88/72 (79) HR 105-113    Follow Up Recommendations  CIR;Supervision/Assistance - 24 hour    Equipment Recommendations  3 in 1 bedside commode    Recommendations for Other Services       Precautions / Restrictions Precautions Precautions: Fall;Other (comment) Precaution Comments: cortrak, O2 Restrictions Weight Bearing Restrictions: No      Mobility Bed Mobility Overal bed mobility: Needs Assistance Bed Mobility: Supine to Sit     Supine to sit: Min guard;HOB elevated     General bed mobility comments: HOB 35 degrees with cues for sequence and increased time to transfers, guarding to monitor safety and lines    Transfers Overall transfer level: Needs assistance   Transfers: Sit to/from Stand;Stand Pivot Transfers Sit to Stand: Min assist;+2 safety/equipment Stand pivot transfers: Mod assist;+2 safety/equipment;+2 physical assistance       General transfer comment: min assist to rise from bedx 2 trials with cues for hand placement with pt able to stand  but limited tolerance for weight shifting with return to sitting with attempt to side step. Mod +2 to pivot from bed to chair with premature sitting. Cues for scooting    Balance Overall balance assessment: Needs assistance Sitting-balance support: No upper extremity supported;Feet supported Sitting balance-Leahy Scale: Fair Sitting balance - Comments: EOB without UE support   Standing balance support: Bilateral upper extremity supported Standing balance-Leahy Scale: Poor Standing balance comment: bil UE support with tactile cues in standing                           ADL either performed or assessed with clinical judgement   ADL Overall ADL's : Needs assistance/impaired Eating/Feeding: NPO   Grooming: Minimal assistance;Sitting   Upper Body Bathing: Minimal assistance;Sitting   Lower Body Bathing: Moderate assistance;Sitting/lateral leans;Sit to/from stand   Upper Body Dressing : Minimal assistance;Sitting   Lower Body Dressing: Moderate assistance;Cueing for safety;Sitting/lateral leans;Sit to/from stand   Toilet Transfer: Stand-pivot;Cueing for safety;Moderate assistance;+2 for physical assistance Toilet Transfer Details (indicate cue type and reason): +2 simulated to recliner Toileting- Clothing Manipulation and Hygiene: Moderate assistance;Sitting/lateral lean;Sit to/from stand       Functional mobility during ADLs: Moderate assistance;+2 for physical assistance;+2 for safety/equipment;Rolling walker;Cueing for sequencing;Cueing for safety General ADL Comments: Pt limited by varying levels of arousal and decreased cognition; decreased strength and ability to care for self.     Vision Baseline Vision/History: No visual deficits Patient Visual Report: Other (comment) Additional Comments: continue to assess; pt requires increased alertness; appears to be scanning room  Perception     Praxis      Pertinent Vitals/Pain Pain Assessment: 0-10 Pain Score: 5   Pain Location: generalized Pain Descriptors / Indicators: Discomfort Pain Intervention(s): Monitored during session     Hand Dominance Right   Extremity/Trunk Assessment Upper Extremity Assessment Upper Extremity Assessment: Generalized weakness   Lower Extremity Assessment Lower Extremity Assessment: Generalized weakness   Cervical / Trunk Assessment Cervical / Trunk Assessment: Other exceptions Cervical / Trunk Exceptions: rounded shoulders   Communication Communication Communication: Expressive difficulties (mumbling speech)   Cognition Arousal/Alertness: Awake/alert;Lethargic Behavior During Therapy: Flat affect Overall Cognitive Status: Impaired/Different from baseline Area of Impairment: Orientation;Memory;Following commands;Safety/judgement;Problem solving                 Orientation Level: Disoriented to;Time;Situation   Memory: Decreased short-term memory;Decreased recall of precautions Following Commands: Follows one step commands inconsistently;Follows one step commands with increased time Safety/Judgement: Decreased awareness of safety;Decreased awareness of deficits   Problem Solving: Slow processing;Decreased initiation;Requires verbal cues General Comments: Pt unable to recall date; confused about situation even after explanation 3 minutes earlier. Pt following simple commands- appears woozy from medications as pt appeared very drowsy.   General Comments  SpO2 90-95% on 3L  BP supine 94/57 (68)  Sitting 88/72 (79)  HR 105-113    Exercises     Shoulder Instructions      Home Living Family/patient expects to be discharged to:: Private residence Living Arrangements: Spouse/significant other;Children Available Help at Discharge: Family Type of Home: House Home Access: Stairs to enter;Other (comment) Entrance Stairs-Number of Steps: 2 Entrance Stairs-Rails: None Home Layout: One level     Bathroom Shower/Tub: Teacher, early years/pre:  Standard     Home Equipment: Environmental consultant - 2 wheels;Wheelchair - manual (reported on 04/2020 thay pt had tub bench and BSC)   Additional Comments: Information different from previous stay in Jan 2022      Prior Functioning/Environment Level of Independence: Needs assistance    ADL's / Homemaking Assistance Needed: independent per pt.   Comments: Pt possibly with unreliable info as pt with differing info from last admission in Jan 2022        OT Problem List: Decreased strength;Decreased activity tolerance;Impaired balance (sitting and/or standing);Decreased cognition;Decreased safety awareness;Cardiopulmonary status limiting activity;Increased edema;Pain      OT Treatment/Interventions: Self-care/ADL training;Therapeutic exercise;Energy conservation;DME and/or AE instruction;Therapeutic activities;Cognitive remediation/compensation;Visual/perceptual remediation/compensation;Patient/family education;Balance training    OT Goals(Current goals can be found in the care plan section) Acute Rehab OT Goals Patient Stated Goal: return home OT Goal Formulation: Patient unable to participate in goal setting Time For Goal Achievement: 06/27/20 Potential to Achieve Goals: Good ADL Goals Pt Will Perform Grooming: with set-up;sitting Pt Will Perform Lower Body Dressing: with min guard assist;sitting/lateral leans;sit to/from stand Pt Will Transfer to Toilet: with min guard assist;stand pivot transfer;bedside commode Additional ADL Goal #1: Pt will participate in x10 mins of OOB ADL tasks in minimally distracting environment with minimal cues to sequence through tasks, Additional ADL Goal #2: Pt participate in cognitive assessment and higher level cognitive assessment in order to continue to assess safety with ADL/mobility.  OT Frequency: Min 2X/week   Barriers to D/C:            Co-evaluation PT/OT/SLP Co-Evaluation/Treatment: Yes Reason for Co-Treatment: Complexity of the patient's  impairments (multi-system involvement);To address functional/ADL transfers;For patient/therapist safety   OT goals addressed during session: ADL's and self-care      AM-PAC OT "6 Clicks" Daily Activity  Outcome Measure Help from another person eating meals?: Total Help from another person taking care of personal grooming?: A Little Help from another person toileting, which includes using toliet, bedpan, or urinal?: A Lot Help from another person bathing (including washing, rinsing, drying)?: A Lot Help from another person to put on and taking off regular upper body clothing?: A Little Help from another person to put on and taking off regular lower body clothing?: A Lot 6 Click Score: 13   End of Session Equipment Utilized During Treatment: Gait belt;Oxygen Nurse Communication: Mobility status  Activity Tolerance: Patient tolerated treatment well Patient left: in chair;with call bell/phone within reach;with chair alarm set  OT Visit Diagnosis: Unsteadiness on feet (R26.81);Muscle weakness (generalized) (M62.81);Other symptoms and signs involving cognitive function                Time: 0121-0141 OT Time Calculation (min): 20 min Charges:  OT General Charges $OT Visit: 1 Visit OT Evaluation $OT Eval Moderate Complexity: 1 Mod  Jefferey Pica, OTR/L Acute Rehabilitation Services Pager: [phone] Office: [phone]   [id number] C 06/13/2020, 5:59 PM

## 2020-06-13 NOTE — Progress Notes (Addendum)
NAME:  [name], MRN:  [id number], DOB:  [birth year], LOS: 5 ADMISSION DATE:  06/08/2020, CONSULTATION DATE:  06/08/2020 REFERRING MD:  Dr. Karle Starch, ER, CHIEF COMPLAINT:  Cardiac arrest   Brief History:  50 y/o female with hx of opiate use, PTSD, and depression who presented to [facility] 2/16 after being found laying on the floor for several hours.  Found by EMS to have agonal respiratory pattern and then asystole. King airway placed and received epinephrine with 3 minutes of CPR before ROSC achieved.  In ER found to have severe acidosis, hypokalemia, acute renal failure, hypothermia, and shock.  UDS positive for amphetamines (not prescribed).  Past Medical History:  PTSD, Depression, HTN, HLD, Headache, Opiate dependence, Back pain, Anxiety, Osteoarthritis, COVID 19 infection January 2022  Significant Hospital Events:  2/16 Admit, UDS + amphetamines, asystole / CPR.  Started CRRT 2/20 PSV wean > extubated.  Remains on pressors, CRRT.    Consults:  Nephrology  Procedures:  ETT 2/16 >> 2/20 Rt IJ HD cath 2/16 >>   Lt IJ CVL 2/16 >> Rt femoral arterial line 2/18 >> 2/21  Significant Diagnostic Tests:   CT head 2/16 >> normal brain   Renal u/s 2/16 >> unremarkable  ECHO 2/17 >> EF 55-60%, no RWMA  Micro Data:  BCx2 (HD cath) 2/16 >> negative  BCx2 2/16 >> MRSA PCR 2/16 >> positive  Antimicrobials:  Zosyn 2/16 >>    Interim History / Subjective:  Afebrile / WBC 8.8  On 2L [place]  Glucose range 170-201 I/O 2.4L removed with UF, -353ml in last 24h   Objective   Blood pressure (!) 119/54, pulse (!) 111, temperature (!) 97.3 F (36.3 C), temperature source Oral, resp. rate (!) 24, height 5\' 6"  (1.676 m), weight 87.5 kg, last menstrual period 06/03/2018, SpO2 97 %.        Intake/Output Summary (Last 24 hours) at 06/13/2020 0956 Last data filed at 06/13/2020 0900 Gross per 24 hour  Intake 2131.93 ml  Output 2340 ml  Net -208.07 ml   Filed Weights   06/11/20 0319 06/12/20  0106 06/13/20 0500  Weight: 88.1 kg 88.6 kg 87.5 kg    Examination: General: chronically ill appearing adult female lying in bed in NAD, husband at bedside  HEENT: MM pink/moist, small bore feeding tube in place  Neuro: Awake / alert, speech clear, appropriate, generalized weakness / moves all extremities  CV: s1s2 RRR, no m/r/g PULM: non-labored on [place] O2, lungs bilaterally clear  GI: soft, bsx4 active  Extremities: warm/dry, trace dependent edema  Skin: no rashes or lesions   Resolved Hospital Problem list   Anion gap metabolic acidosis with lactic acidosis, Hyperkalemia with ECG changes, Cardiac arrest with asystole from opiate/amphetamine OD, Hypothermia, Hypocalcemia, Rhabdomyolysis, septic shock  Assessment & Plan:   Aspiration pneumonitis. -D6/7 zosyn  -follow BP trend  Acute hypoxic respiratory failure with compromised airway in setting of cardiac arrest and aspiration pneumonitis Extubated 2/20 -wean O2 for sats >90% -pulmonary hygiene -mobilize, IS -follow intermittent CXR  -remains risk for reintubation  Acute metabolic encephalopathy 2nd to hypoxia, renal failure, sepsis Hx of depression, PTSD, anxiety, chronic opiate use HIV negative. UDS positive for amphetamines on admit (not prescribed) -continue prozac  -hold home lamictal, risperdal, zanaflex, trazodoen, xanax  -suspect based on family description her medication regimen should be reduced.  Family describes debilitating psychiatric disease to the point that she is essentially bed bound. Will ask PSY to evaluate her for recommendations for reduction, outpatient  therapy recommendations > she will need to be more alert to participate in exam  AKI from ATN in setting of shock and hypoxia Hypophosphatemia -appreciate Nephrology  -follow urinary output / need for further HD  -Trend BMP / urinary output -Replace electrolytes as indicated -Avoid nephrotoxic agents, ensure adequate renal perfusion  Hx of  HTN -hold home HCTZ, lisinopril   Hyperglycemia HbA1C 6.1 from 2/16 -SSI   Dysphagia -SLP evaluation pending  -continue cortrak in the interim  Anemia of critical illness Mild thrombocytopenia, likely from sepsis -follow CBC -transfuse for Hgb <7% or active bleeding   Suspected Deconditioning  Has been in bed essentially since November 2021 -PT / OT  Best practice (evaluated daily)  Diet: tube feeds DVT prophylaxis: SQ heparin GI prophylaxis: protonix Mobility: PT/OT to assess after extubation Disposition: ICU  Goals of Care:  Last date of multidisciplinary goals of care discussion: pending Family and staff present: pending Summary of discussion: pending Follow up goals of care discussion due: pending Code Status: Full Code   Family: Husband updated at bedside 2/21 on plan of care.   Labs:   CMP Latest Ref Rng & Units 06/13/2020 06/12/2020 06/12/2020  Glucose 70 - 99 mg/dL 186(H) 184(H) 232(H)  BUN 6 - 20 mg/dL 20 18 13   Creatinine 0.44 - 1.00 mg/dL 1.38(H) 1.46(H) 1.48(H)  Sodium 135 - 145 mmol/L 136 136 134(L)  Potassium 3.5 - 5.1 mmol/L 4.0 4.1 4.0  Chloride 98 - 111 mmol/L 101 100 101  CO2 22 - 32 mmol/L 26 26 23   Calcium 8.9 - 10.3 mg/dL 7.7(L) 7.9(L) 7.6(L)  Total Protein 6.5 - 8.1 g/dL - - -  Total Bilirubin 0.3 - 1.2 mg/dL - - -  Alkaline Phos 38 - 126 U/L - - -  AST 15 - 41 U/L - - -  ALT 0 - 44 U/L - - -    CBC Latest Ref Rng & Units 06/13/2020 06/12/2020 06/11/2020  WBC 4.0 - 10.5 K/uL 8.8 10.1 13.5(H)  Hemoglobin 12.0 - 15.0 g/dL 9.5(L) 9.3(L) 9.8(L)  Hematocrit 36.0 - 46.0 % 30.3(L) 29.0(L) 30.1(L)  Platelets 150 - 400 K/uL 103(L) 112(L) 138(L)    ABG    Component Value Date/Time   PHART 7.485 (H) 06/10/2020 0405   PCO2ART 31.9 (L) 06/10/2020 0405   PO2ART 71 (L) 06/10/2020 0405   HCO3 24.2 06/10/2020 0405   TCO2 25 06/10/2020 0405   ACIDBASEDEF 10.0 (H) 06/08/2020 2238   O2SAT 96.0 06/10/2020 0405    CBG (last 3)  Recent Labs     06/12/20 2318 06/13/20 0335 06/13/20 0731  GLUCAP 171* 170* 201*    Critical care time:      Noe Gens, MSN, APRN, NP-C, AGACNP-BC [facility] 06/13/2020, 9:56 AM   Please see [email or web address] for pager details.   From 7A-7P if no response, please call [phone] After hours, please call ELink [phone]

## 2020-06-13 NOTE — Progress Notes (Signed)
PT Cancellation Note  Patient Details Name: [name] MRN: [id number] DOB: 07-13-1970   Cancelled Treatment:    Reason Eval/Treat Not Completed: Other (comment) (pt with right fem art line with plan to remove. will hold until after removal)   Karrina Lye B Avaley Coop 06/13/2020, 11:24 AM  Bayard Males, PT Acute Rehabilitation Services Pager: [phone] Office: [phone]

## 2020-06-13 NOTE — Progress Notes (Signed)
Inpatient Diabetes Program Recommendations  AACE/ADA: New Consensus Statement on Inpatient Glycemic Control  Target Ranges:  Prepandial:   less than 140 mg/dL      Peak postprandial:   less than 180 mg/dL (1-2 hours)      Critically ill patients:  140 - 180 mg/dL   Results for [name], [name] (MRN [id number]) as of 06/13/2020 13:16  Ref. Range 06/12/2020 08:17 06/12/2020 11:51 06/12/2020 16:08 06/12/2020 19:28 06/12/2020 23:18 06/13/2020 03:35 06/13/2020 07:31 06/13/2020 11:55  Glucose-Capillary Latest Ref Range: 70 - 99 mg/dL 215 (H) 224 (H) 156 (H) 176 (H) 171 (H) 170 (H) 201 (H) 261 (H)   Review of Glycemic Control  Current orders for Inpatient glycemic control: Novolog 0-9 units Q4H; Vital @ 60 ml/hr  Inpatient Diabetes Program Recommendations:    Insulin: Please consider ordering Novolog 3 units Q4H for tube feeding coverage. If tube feeding is stopped or held then Novolog tube feeding coverage should also be stopped or held.  Thanks, Barnie Alderman, RN, MSN, CDE Diabetes Coordinator Inpatient Diabetes Program [phone] (Team Pager from 8am to 5pm)

## 2020-06-13 NOTE — Evaluation (Signed)
Physical Therapy Evaluation Patient Details Name: [name] MRN: [id number] DOB: 12/22/1970 Today's Date: 06/13/2020   History of Present Illness  50 yo 2/16 after fell out of bed and family checked on her later and found unresponsive s/p arrest and CPR with intubation. CRRT 2/16-2/20. Extubated 2/20. PMhx: HTN, HLD, anxiety/depression, PTSD, Covid + 1/18, Rt ankle fx s/p ORIF, endometriosis  Clinical Impression  Pt supine on arrival with decreased attention, orientation and memory. Pt with decreased strength, tranfers and balance in standing who will benefit from acute therapy to maximize mobility, safety and function to decrease burden of care. Encouraged OOB daily with nursing staff with need for assist for all mobility currently. Pt states she lives with spouse and 2 adult sons but all work and no 24hr assist currently available.   SpO2 90-95% on 3L BP supine 94/57 (68) Sitting 88/72 (79) HR 105-113    Follow Up Recommendations SNF;Supervision/Assistance - 24 hour;CIR (pending progression, cognition and family assist)    Equipment Recommendations  None recommended by PT    Recommendations for Other Services       Precautions / Restrictions Precautions Precautions: Fall;Other (comment) Precaution Comments: cortrak, O2      Mobility  Bed Mobility Overal bed mobility: Needs Assistance Bed Mobility: Supine to Sit     Supine to sit: Min guard;HOB elevated     General bed mobility comments: HOB 35 degrees with cues for sequence and increased time to transfers, guarding to monitor safety and lines    Transfers Overall transfer level: Needs assistance   Transfers: Sit to/from Stand;Stand Pivot Transfers Sit to Stand: Min assist;+2 safety/equipment Stand pivot transfers: Mod assist;+2 safety/equipment;+2 physical assistance       General transfer comment: min assist to rise from bedx 2 trials with cues for hand placement with pt able to stand but limited tolerance  for weight shifting with return to sitting with attempt to side step. Mod +2 to pivot from bed to chair with premature sitting. Cues for scooting  Ambulation/Gait             General Gait Details: not yet able  Stairs            Wheelchair Mobility    Modified Rankin (Stroke Patients Only)       Balance Overall balance assessment: Needs assistance Sitting-balance support: No upper extremity supported;Feet supported Sitting balance-Leahy Scale: Fair Sitting balance - Comments: EOB without UE support   Standing balance support: Bilateral upper extremity supported Standing balance-Leahy Scale: Poor Standing balance comment: bil UE support with tactile cues in standing                             Pertinent Vitals/Pain Pain Assessment: No/denies pain    Home Living Family/patient expects to be discharged to:: Private residence Living Arrangements: Spouse/significant other;Children Available Help at Discharge: Family Type of Home: House Home Access: Stairs to enter;Other (comment) Entrance Stairs-Rails: None Entrance Stairs-Number of Steps: 2 Home Layout: One level Home Equipment: Walker - 2 wheels;Wheelchair - manual (reported on 04/2020 thay pt had tub bench and BSC)      Prior Function Level of Independence: Needs assistance      ADL's / Homemaking Assistance Needed: independent per pt.  Comments: Pt possibly with unreliable info as pt with differing info from last admission in Jan 2022     Hand Dominance        Extremity/Trunk Assessment   Upper  Extremity Assessment Upper Extremity Assessment: Generalized weakness    Lower Extremity Assessment Lower Extremity Assessment: Generalized weakness    Cervical / Trunk Assessment Cervical / Trunk Assessment: Other exceptions Cervical / Trunk Exceptions: rounded shoulders  Communication   Communication: Expressive difficulties (mumbling speech)  Cognition Arousal/Alertness:  Awake/alert Behavior During Therapy: Flat affect Overall Cognitive Status: Impaired/Different from baseline Area of Impairment: Orientation;Memory;Following commands;Safety/judgement;Problem solving                 Orientation Level: Disoriented to;Time;Situation   Memory: Decreased short-term memory;Decreased recall of precautions Following Commands: Follows one step commands inconsistently;Follows one step commands with increased time Safety/Judgement: Decreased awareness of safety;Decreased awareness of deficits   Problem Solving: Slow processing;Decreased initiation;Requires verbal cues General Comments: pt unable to recall date even after education during session or situation, slow to respond      General Comments      Exercises     Assessment/Plan    PT Assessment Patient needs continued PT services  PT Problem List Decreased mobility;Decreased activity tolerance;Decreased strength;Decreased knowledge of use of DME       PT Treatment Interventions Gait training;Balance training;Functional mobility training;Therapeutic activities;Patient/family education;Cognitive remediation;Neuromuscular re-education;DME instruction;Therapeutic exercise    PT Goals (Current goals can be found in the Care Plan section)  Acute Rehab PT Goals Patient Stated Goal: return home PT Goal Formulation: With patient Time For Goal Achievement: 06/27/20 Potential to Achieve Goals: Fair    Frequency Min 3X/week   Barriers to discharge Decreased caregiver support pt reports sons and spouse work    Co-evaluation PT/OT/SLP Co-Evaluation/Treatment: Yes Reason for Co-Treatment: Complexity of the patient's impairments (multi-system involvement) PT goals addressed during session: Mobility/safety with mobility OT goals addressed during session: ADL's and self-care       AM-PAC PT "6 Clicks" Mobility  Outcome Measure Help needed turning from your back to your side while in a flat bed  without using bedrails?: A Little Help needed moving from lying on your back to sitting on the side of a flat bed without using bedrails?: A Little Help needed moving to and from a bed to a chair (including a wheelchair)?: A Lot Help needed standing up from a chair using your arms (e.g., wheelchair or bedside chair)?: A Little Help needed to walk in hospital room?: Total Help needed climbing 3-5 steps with a railing? : Total 6 Click Score: 13    End of Session Equipment Utilized During Treatment: Oxygen;Gait belt Activity Tolerance: Patient tolerated treatment well Patient left: in chair;with call bell/phone within reach;with chair alarm set;with nursing/sitter in room Nurse Communication: Mobility status;Precautions PT Visit Diagnosis: Other abnormalities of gait and mobility (R26.89);Difficulty in walking, not elsewhere classified (R26.2);Muscle weakness (generalized) (M62.81)    Time: 4827-0786 PT Time Calculation (min) (ACUTE ONLY): 21 min   Charges:   PT Evaluation $PT Eval Moderate Complexity: 1 Mod          Denni France P, PT Acute Rehabilitation Services Pager: [phone] Office: [phone]   Sandy Salaam Szymon Foiles 06/13/2020, 2:05 PM

## 2020-06-13 NOTE — Progress Notes (Addendum)
[facility] ROUNDING NOTE   Subjective:   no uop charted.  Had 2.5 liters UF over 2/20 with CRRT.  Off of pressors now.  Extubated in interim.  She had cartridge changed this AM at 2 and was working ok until she was turned - then malfunctioned and wasn't able to restart on first attempt.  She has been keep even   Review of systems: offers no additional history - still waking up. husband at bedside states extubated yesterday  ------------------ Brief History: 50 y.o. female. With a significant history of hypertension, endometriosis and depression that was seen in the IR 05/10/20 for abdominal pain and was found to have Covid 19 infection   She was mildly symptomatic and was discharged home on 05/12/20. She apparently fell out of bed during the night and was found to be in asystole and she was intubated and underwent chest compressions as well as EPI with ROSC. Thought that cardiac arrest was related to opioid overdose. AKI now requiring CRRT starting 2/16 likely 2/2 ATN.   In the ER, she was seen by Dr Gwenlyn Found, Cardiology and 2 D Echo performed   She had a preserved ejection fraction and no wall motion abnormality. Her blood pressure was 58/33  Pulse was 87 and she was hypothermic at 92 degrees.   Objective:  Vital signs in last 24 hours:  Temp:  [97.3 F (36.3 C)-97.9 F (36.6 C)] 97.3 F (36.3 C) (02/21 0739) Pulse Rate:  [86-105] 105 (02/21 0700) Resp:  [13-22] 18 (02/21 0739) BP: (105-119)/(54-56) 119/54 (02/21 0337) SpO2:  [86 %-100 %] 90 % (02/21 0700) Arterial Line BP: (105-128)/(47-65) 116/58 (02/21 0739) Weight:  [87.5 kg] 87.5 kg (02/21 0500)  Weight change: -1.1 kg Filed Weights   06/11/20 0319 06/12/20 0106 06/13/20 0500  Weight: 88.1 kg 88.6 kg 87.5 kg    Intake/Output: I/O last 3 completed shifts: In: 3884.7 [I.V.:606.9; NG/GT:2160; IV Piggyback:1117.9] Out: 4709 [Other:4405]   Intake/Output this shift:  No intake/output data recorded.  General adult  female in bed in no acute distress HEENT normocephalic atraumatic extraocular movements intact sclera anicteric Neck supple trachea midline Lungs clear to auscultation bilaterally normal work of breathing at rest on nasal cannula Heart S1S2 no rub Abdomen soft nontender nondistended Extremities no edema  Neuro - limited speech; eyes open/awake on arrival   Basic Metabolic Panel: Recent Labs  Lab 06/10/20 0359 06/10/20 0405 06/10/20 1637 06/11/20 0203 06/11/20 0306 06/11/20 1630 06/12/20 0258 06/12/20 1714 06/13/20 0328  NA 135   < > 136 137  --  136 134* 136 136  K 4.3   < > 4.2 4.0  --  4.1 4.0 4.1 4.0  CL 101  --  101 103  --  102 101 100 101  CO2 19*  --  21* 22  --  23 23 26 26   GLUCOSE 105*  --  104* 141*  --  166* 232* 184* 186*  BUN 17  --  7 6  --  10 13 18 20   CREATININE 2.13*  --  1.53* 1.33*  --  1.48* 1.48* 1.46* 1.38*  CALCIUM 8.2*  --  8.4* 8.1*  --  7.8* 7.6* 7.9* 7.7*  MG 2.4  --  2.6*  --   --  2.4 2.2  --  2.1  PHOS 2.4*  --  1.9* <1.0* <1.0* 1.0* 1.5* 2.5 1.8*   < > = values in this interval not displayed.    Liver Function Tests: Recent Labs  Lab 06/08/20 1423 06/08/20 1944 06/09/20 0440 06/09/20 1730 06/11/20 0203 06/11/20 1630 06/12/20 0258 06/12/20 1714 06/13/20 0328  AST 93*  --  172*  --   --   --   --   --   --   ALT 26  --  41  --   --   --   --   --   --   ALKPHOS 109  --  107  --   --   --   --   --   --   BILITOT 0.9  --  1.1  --   --   --   --   --   --   PROT 6.6  --  6.3*  --   --   --   --   --   --   ALBUMIN 2.4*   < > 2.7*   < > 2.5* 2.4* 2.3* 2.4* 2.1*   < > = values in this interval not displayed.   No results for input(s): LIPASE, AMYLASE in the last 168 hours. No results for input(s): AMMONIA in the last 168 hours.  CBC: Recent Labs  Lab 06/09/20 0615 06/10/20 0359 06/10/20 0405 06/11/20 0306 06/12/20 0258 06/13/20 0328  WBC 14.0* 17.6*  --  13.5* 10.1 8.8  HGB 11.2* 11.2* 11.2* 9.8* 9.3* 9.5*  HCT 32.0* 32.9*  33.0* 30.1* 29.0* 30.3*  MCV 87.0 90.9  --  94.4 94.8 97.7  PLT 224 185  --  138* 112* 103*    Cardiac Enzymes: Recent Labs  Lab 06/08/20 1847  CKTOTAL 5,564*    BNP: Invalid input(s): POCBNP  CBG: Recent Labs  Lab 06/12/20 1608 06/12/20 1928 06/12/20 2318 06/13/20 0335 06/13/20 0731  GLUCAP 156* 176* 171* 170* 31*    Microbiology: Results for orders placed or performed during the hospital encounter of 06/08/20  MRSA PCR Screening     Status: Abnormal   Collection Time: 06/08/20  8:44 PM   Specimen: Nasopharyngeal  Result Value Ref Range Status   MRSA by PCR POSITIVE (A) NEGATIVE Final    Comment:        The GeneXpert MRSA Assay (FDA approved for NASAL specimens only), is one component of a comprehensive MRSA colonization surveillance program. It is not intended to diagnose MRSA infection nor to guide or monitor treatment for MRSA infections. RESULT CALLED TO, READ BACK BY AND VERIFIED WITH: Ferrel Logan 06/08/20 2359 JDW Performed at [facility], [address] [place], [place] [address]   Blood culture (routine x 2)     Status: None   Collection Time: 06/08/20  9:25 PM   Specimen: BLOOD  Result Value Ref Range Status   Specimen Description BLOOD HEMODIALYSIS CATHETER  Final   Special Requests   Final    BOTTLES DRAWN AEROBIC AND ANAEROBIC Blood Culture results may not be optimal due to an excessive volume of blood received in culture bottles   Culture   Final    NO GROWTH 5 DAYS Performed at [facility], [place] [address] [place], [place] [address]    Report Status 06/13/2020 FINAL  Final  Blood culture (routine x 2)     Status: None (Preliminary result)   Collection Time: 06/08/20  9:25 PM   Specimen: BLOOD LEFT WRIST  Result Value Ref Range Status   Specimen Description BLOOD LEFT WRIST  Final   Special Requests   Final    BOTTLES DRAWN AEROBIC ONLY Blood Culture adequate  volume   Culture   Final    NO GROWTH 4 DAYS Performed at [facility], [place] [address], [place], [place] [address]    Report Status PENDING  Incomplete    Coagulation Studies: No results for input(s): LABPROT, INR in the last 72 hours.  Urinalysis: No results for input(s): COLORURINE, LABSPEC, PHURINE, GLUCOSEU, HGBUR, BILIRUBINUR, KETONESUR, PROTEINUR, UROBILINOGEN, NITRITE, LEUKOCYTESUR in the last 72 hours.  Invalid input(s): APPERANCEUR    Imaging: No results found.   Medications:   .  prismasol BGK 4/2.5 500 mL/hr at 06/12/20 2137  .  prismasol BGK 4/2.5 500 mL/hr at 06/13/20 0745  . sodium chloride 10 mL/hr at 06/13/20 0700  . calcium gluconate    . feeding supplement (VITAL AF 1.2 CAL) 1,000 mL (06/12/20 1838)  . norepinephrine (LEVOPHED) Adult infusion Stopped (06/12/20 1720)  . phenylephrine (NEO-SYNEPHRINE) Adult infusion Stopped (06/10/20 1111)  . piperacillin-tazobactam Stopped (06/13/20 5003)  . prismasol BGK 4/2.5 1,000 mL/hr at 06/13/20 0504  . sodium chloride    . sodium phosphate  Dextrose 5% IVPB 43 mL/hr at 06/13/20 0700  . vasopressin Stopped (06/12/20 1230)   . B-complex with vitamin C  1 tablet Per Tube Daily  . chlorhexidine  15 mL Mouth Rinse BID  . Chlorhexidine Gluconate Cloth  6 each Topical Daily  . feeding supplement (PROSource TF)  45 mL Per Tube Daily  . FLUoxetine  40 mg Per Tube Daily  . heparin injection (subcutaneous)  5,000 Units Subcutaneous Q8H  . insulin aspart  0-9 Units Subcutaneous Q4H  . mouth rinse  15 mL Mouth Rinse q12n4p  . mupirocin ointment  1 application Nasal BID  . pantoprazole sodium  40 mg Per Tube Q24H  . sodium chloride flush  10-40 mL Intracatheter Q12H   docusate, heparin, polyethylene glycol, sodium chloride, sodium chloride flush  Assessment/ Plan:  1.Acute kidney Injury: Normal creatinine at baseline but fluctuating in history of AKI.  Severe AKI with creatinine of 25 on arrival likely ATN.  No signs of hydronephrosis or glomerular disease.  Mild elevation in CPK  possibly contributing.   - Continue CRRT for now; monitor for any recovery.  UF goal keep even for now. 4k fluids  - RN will let me know if not able to restart and in that event will hold CRRT  2. Shock/hypotension. Considering cardiogenic and septic   Antibiotics and cultures per primary Service. Pressors now off  3. VDRF - recent mild covid.  Off vent on supp oxygen    4. Unresponsiveness  CT head and drug screen and airway management per CCM.    5. Hypophosphatemia: Significantly low likely related to CRRT. Continue ongoing repletion. Got a dose 2/21 am  6. Hyperkalemia   resolved. With CRRT  7. Normocytic anemia - anticipate may need ESA    Claudia Desanctis, MD 06/13/2020 8:37 AM

## 2020-06-13 NOTE — Evaluation (Signed)
Clinical/Bedside Swallow Evaluation Patient Details  Name: [name] MRN: [id number] Date of Birth: June 02, 1970  Today's Date: 06/13/2020 Time: SLP Start Time (ACUTE ONLY): 89 SLP Stop Time (ACUTE ONLY): 1255 SLP Time Calculation (min) (ACUTE ONLY): 15 min  Past Medical History:  Past Medical History:  Diagnosis Date  . Anxiety   . Arthritis    "joints ache all over" (10/15/2014)  . Bulging lumbar disc   . Chronic lower back pain   . DDD (degenerative disc disease), cervical   . Depression   . Drug-seeking behavior   . Headache    "weekly" (10/15/2014)  . Hyperlipemia   . Hypertension   . PTSD (post-traumatic stress disorder)   . Skin cancer    "had them cut off my arms; don't know what kind"   Past Surgical History:  Past Surgical History:  Procedure Laterality Date  . ABLATION ON ENDOMETRIOSIS  2008  . BIOPSY  12/27/2018   Procedure: BIOPSY;  Surgeon: Thornton Park, MD;  Location: [facility];  Service: Gastroenterology;;  . ESOPHAGOGASTRODUODENOSCOPY (EGD) WITH PROPOFOL N/A 12/27/2018   Procedure: ESOPHAGOGASTRODUODENOSCOPY (EGD) WITH PROPOFOL;  Surgeon: Thornton Park, MD;  Location: [facility];  Service: Gastroenterology;  Laterality: N/A;  . HEMORRHOID SURGERY  ~ 2002  . ORIF ANKLE FRACTURE Right 03/28/2020   Procedure: OPEN REDUCTION INTERNAL FIXATION (ORIF) RIGHT BIMALLEOLAR ANKLE FRACTURE;  Surgeon: Marchia Bond, MD;  Location: [facility];  Service: Orthopedics;  Laterality: Right;   HPI:  [name] who apparently fell out of bed during the night and was found to be in asystole, and she was intubated and underwent chest compressions as well as EPI with ROSC.  Pt was admitted for COVID-19 in mid January.  Pt was intubated from 06/08/20 - 06/12/20.   Assessment / Plan / Recommendation Clinical Impression  Pt was seen for a bedside swallow evaluation in the setting of recent extubation on 2/20.  Pt was encountered awake/alert  with Cortrak in place.  She was lethargic, but agreeable to this evaluation.  Pt's vocal quality was noted to be hoarse and she had low vocal intensity upon SLP arrival.  Pt stated that this is a change from her baseline. Oral mechanism examination was remarkable for edentulism (dentures at home), and generalized oral motor weakness.  Pt consumed trials of ice chips, thin liquid, nectar-thick liquid, honey-thick liquid, and puree via spoon.  She exhibited overt s/sx of aspiration with all PO trials c/b immediate and/or delayed throat clearing and coughing.  Cough was noted to be weak.   Recommend continuation of NPO at this time with short-term alternative means of nutrition, and medications administered via alternative means.  SLP will f/u to determine readiness for clinical diet initiation vs instrumental swallow study.   SLP Visit Diagnosis: Dysphagia, oropharyngeal phase (R13.12)    Aspiration Risk  Moderate aspiration risk    Diet Recommendation NPO;Alternative means - temporary   Medication Administration: Via alternative means    Other  Recommendations Oral Care Recommendations: Oral care QID;Staff/trained caregiver to provide oral care   Follow up Recommendations Other (comment) (TBD)      Frequency and Duration min 2x/week  2 weeks       Prognosis Prognosis for Safe Diet Advancement: Good      Swallow Study   General HPI: [name] who apparently fell out of bed during the night and was found to be in asystole, and she was intubated and underwent  chest compressions as well as EPI with ROSC.  Pt was admitted for COVID-19 in mid January.  Pt was intubated from 2/16 - 2/20. Type of Study: Bedside Swallow Evaluation Previous Swallow Assessment: None Diet Prior to this Study: NPO;NG Tube Temperature Spikes Noted: No Respiratory Status: Room air History of Recent Intubation: Yes Length of Intubations (days): 4 days Date extubated: 06/12/20 Behavior/Cognition:  Alert;Cooperative;Pleasant mood;Confused Oral Cavity Assessment: Within Functional Limits Oral Care Completed by SLP: No Oral Cavity - Dentition: Edentulous;Dentures, not available Vision: Functional for self-feeding Self-Feeding Abilities: Needs assist Patient Positioning: Upright in bed Baseline Vocal Quality: Hoarse;Low vocal intensity Volitional Cough: Weak Volitional Swallow: Able to elicit    Oral/Motor/Sensory Function Overall Oral Motor/Sensory Function: Mild impairment Facial ROM: Within Functional Limits Facial Symmetry: Within Functional Limits Facial Strength: Reduced right;Reduced left Facial Sensation: Within Functional Limits Lingual ROM: Reduced right;Reduced left Lingual Symmetry: Within Functional Limits Lingual Strength: Reduced Lingual Sensation: Within Functional Limits   Ice Chips Ice chips: Impaired Presentation: Spoon Pharyngeal Phase Impairments: Throat Clearing - Immediate   Thin Liquid Thin Liquid: Impaired Presentation: Spoon Pharyngeal  Phase Impairments: Cough - Delayed;Throat Clearing - Immediate    Nectar Thick Nectar Thick Liquid: Impaired Presentation: Spoon Pharyngeal Phase Impairments: Throat Clearing - Immediate;Cough - Delayed   Honey Thick Honey Thick Liquid: Impaired Presentation: Spoon Pharyngeal Phase Impairments: Throat Clearing - Immediate;Cough - Delayed   Puree Puree: Impaired Presentation: Spoon Pharyngeal Phase Impairments: Throat Clearing - Immediate;Cough - Delayed   Solid     Solid: Not tested     Colin Mulders M.S., CCC-SLP Acute Rehabilitation Services Office: [phone]  Elvia Collum Gissele Narducci 06/13/2020,1:06 PM

## 2020-06-14 ENCOUNTER — Inpatient Hospital Stay (HOSPITAL_COMMUNITY): Payer: Medicare Other

## 2020-06-14 DIAGNOSIS — I469 Cardiac arrest, cause unspecified: Secondary | ICD-10-CM | POA: Diagnosis not present

## 2020-06-14 HISTORY — PX: IR PERC TUN PERIT CATH WO PORT S&I /IMAG: IMG2327

## 2020-06-14 HISTORY — PX: IR FLUORO GUIDE CV LINE RIGHT: IMG2283

## 2020-06-14 HISTORY — PX: IR US GUIDE VASC ACCESS RIGHT: IMG2390

## 2020-06-14 LAB — GLUCOSE, CAPILLARY
Glucose-Capillary: 118 mg/dL — ABNORMAL HIGH (ref 70–99)
Glucose-Capillary: 131 mg/dL — ABNORMAL HIGH (ref 70–99)
Glucose-Capillary: 164 mg/dL — ABNORMAL HIGH (ref 70–99)
Glucose-Capillary: 167 mg/dL — ABNORMAL HIGH (ref 70–99)
Glucose-Capillary: 194 mg/dL — ABNORMAL HIGH (ref 70–99)
Glucose-Capillary: 197 mg/dL — ABNORMAL HIGH (ref 70–99)

## 2020-06-14 LAB — CULTURE, BLOOD (ROUTINE X 2)
Culture: NO GROWTH
Special Requests: ADEQUATE

## 2020-06-14 LAB — MAGNESIUM: Magnesium: 2.1 mg/dL (ref 1.7–2.4)

## 2020-06-14 MED ORDER — HEPARIN SODIUM (PORCINE) 1000 UNIT/ML IJ SOLN
INTRAMUSCULAR | Status: AC | PRN
  Administered 2020-06-14: 1000 [IU]

## 2020-06-14 MED ORDER — ARIPIPRAZOLE 2 MG PO TABS
2.0000 mg | ORAL_TABLET | Freq: Once | ORAL | Status: AC
  Filled 2020-06-14: qty 1

## 2020-06-14 MED ORDER — LIDOCAINE-EPINEPHRINE 1 %-1:100000 IJ SOLN
INTRAMUSCULAR | Status: AC | PRN
  Administered 2020-06-14: 20 mL via INTRADERMAL

## 2020-06-14 MED ORDER — MIDODRINE HCL 5 MG PO TABS: 10.0000 mg | ORAL_TABLET | Freq: Three times a day (TID) | ORAL | Status: DC

## 2020-06-14 MED ORDER — ARIPIPRAZOLE 2 MG PO TABS
2.0000 mg | ORAL_TABLET | Freq: Once | ORAL | Status: AC
  Administered 2020-06-14: 2 mg via ORAL
  Filled 2020-06-14: qty 1

## 2020-06-14 MED ORDER — HEPARIN SODIUM (PORCINE) 1000 UNIT/ML IJ SOLN
INTRAMUSCULAR | Status: AC
  Filled 2020-06-14: qty 1

## 2020-06-14 MED ORDER — CHLORHEXIDINE GLUCONATE CLOTH 2 % EX PADS
6.0000 | MEDICATED_PAD | Freq: Every day | CUTANEOUS | Status: DC
  Administered 2020-06-15: 6 via TOPICAL

## 2020-06-14 MED ORDER — ARIPIPRAZOLE 5 MG PO TABS: 5.0000 mg | ORAL_TABLET | Freq: Every day | ORAL | Status: DC

## 2020-06-14 MED ORDER — MIDODRINE HCL 5 MG PO TABS
10.0000 mg | ORAL_TABLET | Freq: Three times a day (TID) | ORAL | Status: DC
  Administered 2020-06-14 – 2020-06-21 (×21): 10 mg
  Filled 2020-06-14 (×23): qty 2

## 2020-06-14 MED ORDER — HEPARIN SODIUM (PORCINE) 5000 UNIT/ML IJ SOLN
5000.0000 [IU] | Freq: Three times a day (TID) | INTRAMUSCULAR | Status: DC
  Administered 2020-06-14 – 2020-06-16 (×5): 5000 [IU] via SUBCUTANEOUS
  Filled 2020-06-14 (×5): qty 1

## 2020-06-14 MED ORDER — LIDOCAINE-EPINEPHRINE 1 %-1:100000 IJ SOLN
INTRAMUSCULAR | Status: AC
  Filled 2020-06-14: qty 1

## 2020-06-14 MED ORDER — ARIPIPRAZOLE 5 MG PO TABS
5.0000 mg | ORAL_TABLET | Freq: Every day | ORAL | Status: DC
  Administered 2020-06-15 – 2020-06-23 (×8): 5 mg
  Filled 2020-06-14 (×8): qty 1

## 2020-06-14 NOTE — Progress Notes (Signed)
Inpatient Rehabilitation Admissions Coordinator  Inpatient rehab consult received. I will complete assessment tomorrow.  Danne Baxter, RN, MSN Rehab Admissions Coordinator [phone] 06/14/2020 4:26 PM

## 2020-06-14 NOTE — Progress Notes (Addendum)
NAME:  [name], MRN:  [id number], DOB:  06-07-1970, LOS: 6 ADMISSION DATE:  06/08/2020, CONSULTATION DATE:  06/08/2020 REFERRING MD:  Dr. Karle Starch, ER, CHIEF COMPLAINT:  Cardiac arrest   Brief History:  50 y/o female with hx of opiate use, PTSD, and depression who presented to [facility] 2/16 after being found laying on the floor for several hours.  Found by EMS to have agonal respiratory pattern and then asystole. King airway placed and received epinephrine with 3 minutes of CPR before ROSC achieved.  In ER found to have severe acidosis, hypokalemia, acute renal failure, hypothermia, and shock.  UDS positive for amphetamines (not prescribed).  Past Medical History:  PTSD, Depression, HTN, HLD, Headache, Opiate dependence, Back pain, Anxiety, Osteoarthritis, COVID 19 infection January 2022  Significant Hospital Events:  2/16 Admit, UDS + amphetamines, asystole / CPR.  Started CRRT 2/20 PSV wean > extubated.  Remains on pressors, CRRT.   2/22 Anuric, pending perm cath placement per IR.    Consults:  Nephrology  Procedures:  ETT 2/16 >> 2/20 Rt IJ HD cath 2/16 >>   Lt IJ CVL 2/16 >> Rt femoral arterial line 2/18 >> 2/21  Significant Diagnostic Tests:   CT head 2/16 >> normal brain   Renal u/s 2/16 >> unremarkable  ECHO 2/17 >> EF 55-60%, no RWMA  Micro Data:  BCx2 (HD cath) 2/16 >> negative  BCx2 2/16 >> negative  MRSA PCR 2/16 >> positive  Antimicrobials:  Zosyn 2/16 >>  2/22  Interim History / Subjective:  Afebrile  On [place] O2, 3L  Glucose range 164-232 I/O no UOP in last 24 hours, +932ml in last 24 hours RN reports pt pending IR perm cath placement   Objective   Blood pressure (!) 96/53, pulse 96, temperature 98 F (36.7 C), temperature source Oral, resp. rate 16, height 5\' 6"  (1.676 m), weight 89.5 kg, last menstrual period 06/03/2018, SpO2 96 %.        Intake/Output Summary (Last 24 hours) at 06/14/2020 0941 Last data filed at 06/14/2020 0800 Gross per 24 hour   Intake 962.4 ml  Output --  Net 962.4 ml   Filed Weights   06/12/20 0106 06/13/20 0500 06/14/20 0422  Weight: 88.6 kg 87.5 kg 89.5 kg    Examination: General: adult female lying in bed in NAD PSY: flat affect  HEENT: MM pink/dry, small bore feeding tube in place, anicteric  Neuro: sleepy, awakens to voice, speech clear, oriented - initially states incorrect month but corrects herself  CV: s1s2 RRR, no m/r/g PULM: non-labored on [place] O2, lungs bilaterally clear GI: soft, bsx4 active  Extremities: warm/dry, trace edema  Skin: no rashes or lesions  Resolved Hospital Problem list   Anion gap metabolic acidosis with lactic acidosis, Hyperkalemia with ECG changes, Cardiac arrest with asystole from opiate/amphetamine OD, Hypothermia, Hypocalcemia, Rhabdomyolysis, septic shock  Assessment & Plan:   Aspiration pneumonitis. -D7/7 zosyn  -follow intermittent CXR  -pulmonary hygiene   Acute hypoxic respiratory failure with compromised airway in setting of cardiac arrest and aspiration pneumonitis Extubated 2/20 -wean O2 to off for sats >90% -mobilize / push pulm hygiene   Acute metabolic encephalopathy 2nd to hypoxia, renal failure, sepsis Hx of depression, PTSD, anxiety, chronic opiate use HIV negative. UDS positive for amphetamines on admit (not prescribed). Hx of prior suicide attempts.  Not clear purposeful on this admit but does raise concern.  -continue prozac, monitor on monotherapy  -? If she would be a candidate for ECT as  outpatient -hold home lamictal, risperdal, zanaflex, trazodone, xanax  -suspect she will need dose reductions of above based on family description of escalating doses and bedbound status.  Will ask PSY to evaluate her for medication recommendations and possible inpatient / outpatient therapy recommendations -monitor for withdrawal symptoms, none thus far  AKI from ATN in setting of shock and hypoxia Hypophosphatemia -appreciate Nephrology assistance with  patient care -pending placement of perm cath per IR -continue midodrine 10 mg TID  -Trend BMP / urinary output -Replace electrolytes as indicated -Avoid nephrotoxic agents, ensure adequate renal perfusion  Hx of HTN -hold home HCTZ, lisinopril   Hyperglycemia HbA1C 6.1 from 2/16 -SSI    Dysphagia -failed SLP evaluation 2/21, ongoing evaluation  -continue cortrak in the interim   Anemia of critical illness Mild thrombocytopenia, likely from sepsis -follow CBC  -transfuse for Hgb <7%  Suspected Deconditioning  Has been in bed essentially since November 2021 -PT / OT   Best practice (evaluated daily)  Diet: tube feeds DVT prophylaxis: SQ heparin GI prophylaxis: protonix Mobility: PT/OT, as tolerated  Disposition: ICU, may be able to transition to PCU  Goals of Care:  Last date of multidisciplinary goals of care discussion: pending Family and staff present: pending Summary of discussion: pending Follow up goals of care discussion due: pending Code Status: Full Code   Family: Husband updated at bedside 2/21 on plan of care. Will update on arrival 2/22.   Labs:   CMP Latest Ref Rng & Units 06/14/2020 06/13/2020 06/13/2020  Glucose 70 - 99 mg/dL 189(H) 232(H) 186(H)  BUN 6 - 20 mg/dL 58(H) 36(H) 20  Creatinine 0.44 - 1.00 mg/dL 3.34(H) 2.29(H) 1.38(H)  Sodium 135 - 145 mmol/L 137 136 136  Potassium 3.5 - 5.1 mmol/L 4.2 4.3 4.0  Chloride 98 - 111 mmol/L 98 100 101  CO2 22 - 32 mmol/L 24 23 26   Calcium 8.9 - 10.3 mg/dL 7.8(L) 7.7(L) 7.7(L)  Total Protein 6.5 - 8.1 g/dL - - -  Total Bilirubin 0.3 - 1.2 mg/dL - - -  Alkaline Phos 38 - 126 U/L - - -  AST 15 - 41 U/L - - -  ALT 0 - 44 U/L - - -    CBC Latest Ref Rng & Units 06/13/2020 06/12/2020 06/11/2020  WBC 4.0 - 10.5 K/uL 8.8 10.1 13.5(H)  Hemoglobin 12.0 - 15.0 g/dL 9.5(L) 9.3(L) 9.8(L)  Hematocrit 36.0 - 46.0 % 30.3(L) 29.0(L) 30.1(L)  Platelets 150 - 400 K/uL 103(L) 112(L) 138(L)    ABG    Component Value  Date/Time   PHART 7.485 (H) 06/10/2020 0405   PCO2ART 31.9 (L) 06/10/2020 0405   PO2ART 71 (L) 06/10/2020 0405   HCO3 24.2 06/10/2020 0405   TCO2 25 06/10/2020 0405   ACIDBASEDEF 10.0 (H) 06/08/2020 2238   O2SAT 96.0 06/10/2020 0405    CBG (last 3)  Recent Labs    06/13/20 2344 06/14/20 0337 06/14/20 0727  GLUCAP 177* 167* 164*    Critical care time:      Noe Gens, MSN, APRN, NP-C, AGACNP-BC [facility] 06/14/2020, 9:41 AM   Please see [email or web address] for pager details.   From 7A-7P if no response, please call [phone] After hours, please call ELink [phone]
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
## 2020-06-14 NOTE — Progress Notes (Signed)
[facility] ROUNDING NOTE   Subjective:   no uop charted.  Had 0.1 kg UF with CRRT over 2/21 before catheter malfunction.  She was then taken off of CRRT.  Spoke with team.  RN is able to d/c her nontunneled catheter. They may move to floor today if BP ok.  Not on pressors.   Review of systems: states some nausea and some shortness of breath - more interactive and offers more info today   ------------------ Brief History: 50 y.o. female. With a significant history of hypertension, endometriosis and depression that was seen in the IR 05/10/20 for abdominal pain and was found to have Covid 19 infection   She was mildly symptomatic and was discharged home on 05/12/20. She apparently fell out of bed during the night and was found to be in asystole and she was intubated and underwent chest compressions as well as EPI with ROSC. Thought that cardiac arrest was related to opioid overdose. AKI now requiring CRRT starting 2/16 likely 2/2 ATN.   In the ER, she was seen by Dr Gwenlyn Found, Cardiology and 2 D Echo performed   She had a preserved ejection fraction and no wall motion abnormality. Her blood pressure was 58/33  Pulse was 87 and she was hypothermic at 92 degrees.   Objective:  Vital signs in last 24 hours:  Temp:  [98 F (36.7 C)-98.5 F (36.9 C)] 98 F (36.7 C) (02/22 0729) Pulse Rate:  [93-198] 98 (02/22 0729) Resp:  [13-29] 13 (02/22 0729) BP: (74-101)/(43-73) 92/50 (02/22 0700) SpO2:  [88 %-99 %] 99 % (02/22 0729) Arterial Line BP: (93-107)/(39-49) 95/39 (02/21 1200) Weight:  [89.5 kg] 89.5 kg (02/22 0422)  Weight change: 2 kg Filed Weights   06/12/20 0106 06/13/20 0500 06/14/20 0422  Weight: 88.6 kg 87.5 kg 89.5 kg    Intake/Output: I/O last 3 completed shifts: In: 2056.9 [I.V.:267; NG/GT:1380; IV Piggyback:410] Out: 6063 [KZSWF:0932]   Intake/Output this shift:  No intake/output data recorded.  General adult female in bed in no acute distress HEENT normocephalic  atraumatic extraocular movements intact sclera anicteric Neck supple trachea midline Lungs clear to auscultation bilaterally normal work of breathing at rest on nasal cannula Heart S1S2 no rub Abdomen soft nontender nondistended Extremities no edema  Neuro - more awake and interactive today Psych no anxiety or agitation   Basic Metabolic Panel: Recent Labs  Lab 06/10/20 1637 06/11/20 0203 06/11/20 1630 06/12/20 0258 06/12/20 1714 06/13/20 0328 06/13/20 1506 06/14/20 0423  NA 136   < > 136 134* 136 136 136 137  K 4.2   < > 4.1 4.0 4.1 4.0 4.3 4.2  CL 101   < > 102 101 100 101 100 98  CO2 21*   < > 23 23 26 26 23 24   GLUCOSE 104*   < > 166* 232* 184* 186* 232* 189*  BUN 7   < > 10 13 18 20  36* 58*  CREATININE 1.53*   < > 1.48* 1.48* 1.46* 1.38* 2.29* 3.34*  CALCIUM 8.4*   < > 7.8* 7.6* 7.9* 7.7* 7.7* 7.8*  MG 2.6*  --  2.4 2.2  --  2.1  --  2.1  PHOS 1.9*   < > 1.0* 1.5* 2.5 1.8* 3.7 4.1   < > = values in this interval not displayed.    Liver Function Tests: Recent Labs  Lab 06/08/20 1423 06/08/20 1944 06/09/20 0440 06/09/20 1730 06/12/20 0258 06/12/20 1714 06/13/20 0328 06/13/20 1506 06/14/20 0423  AST  93*  --  172*  --   --   --   --   --   --   ALT 26  --  41  --   --   --   --   --   --   ALKPHOS 109  --  107  --   --   --   --   --   --   BILITOT 0.9  --  1.1  --   --   --   --   --   --   PROT 6.6  --  6.3*  --   --   --   --   --   --   ALBUMIN 2.4*   < > 2.7*   < > 2.3* 2.4* 2.1* 2.2* 2.0*   < > = values in this interval not displayed.   No results for input(s): LIPASE, AMYLASE in the last 168 hours. No results for input(s): AMMONIA in the last 168 hours.  CBC: Recent Labs  Lab 06/09/20 0615 06/10/20 0359 06/10/20 0405 06/11/20 0306 06/12/20 0258 06/13/20 0328  WBC 14.0* 17.6*  --  13.5* 10.1 8.8  HGB 11.2* 11.2* 11.2* 9.8* 9.3* 9.5*  HCT 32.0* 32.9* 33.0* 30.1* 29.0* 30.3*  MCV 87.0 90.9  --  94.4 94.8 97.7  PLT 224 185  --  138* 112* 103*     Cardiac Enzymes: Recent Labs  Lab 06/08/20 1847  CKTOTAL 5,564*    BNP: Invalid input(s): POCBNP  CBG: Recent Labs  Lab 06/13/20 1510 06/13/20 2004 06/13/20 2344 06/14/20 0337 06/14/20 0727  GLUCAP 217* 199* 177* 167* 164*    Microbiology: Results for orders placed or performed during the hospital encounter of 06/08/20  MRSA PCR Screening     Status: Abnormal   Collection Time: 06/08/20  8:44 PM   Specimen: Nasopharyngeal  Result Value Ref Range Status   MRSA by PCR POSITIVE (A) NEGATIVE Final    Comment:        The GeneXpert MRSA Assay (FDA approved for NASAL specimens only), is one component of a comprehensive MRSA colonization surveillance program. It is not intended to diagnose MRSA infection nor to guide or monitor treatment for MRSA infections. RESULT CALLED TO, READ BACK BY AND VERIFIED WITH: Ferrel Logan 06/08/20 2359 JDW Performed at [facility], [address], [place], [place] [address]   Blood culture (routine x 2)     Status: None   Collection Time: 06/08/20  9:25 PM   Specimen: BLOOD  Result Value Ref Range Status   Specimen Description BLOOD HEMODIALYSIS CATHETER  Final   Special Requests   Final    BOTTLES DRAWN AEROBIC AND ANAEROBIC Blood Culture results may not be optimal due to an excessive volume of blood received in culture bottles   Culture   Final    NO GROWTH 5 DAYS Performed at [facility], [place] [address] [place], [place] [address]    Report Status 06/13/2020 FINAL  Final  Blood culture (routine x 2)     Status: None   Collection Time: 06/08/20  9:25 PM   Specimen: BLOOD LEFT WRIST  Result Value Ref Range Status   Specimen Description BLOOD LEFT WRIST  Final   Special Requests   Final    BOTTLES DRAWN AEROBIC ONLY Blood Culture adequate volume   Culture   Final    NO GROWTH 5 DAYS Performed at [facility], [place]  [address], [place], [place] [address]    Report Status 06/14/2020 FINAL  Final    Coagulation  Studies: No results for input(s): LABPROT, INR in the last 72 hours.  Urinalysis: No results for input(s): COLORURINE, LABSPEC, PHURINE, GLUCOSEU, HGBUR, BILIRUBINUR, KETONESUR, PROTEINUR, UROBILINOGEN, NITRITE, LEUKOCYTESUR in the last 72 hours.  Invalid input(s): APPERANCEUR    Imaging: No results found.   Medications:   .  prismasol BGK 4/2.5 500 mL/hr at 06/12/20 2137  .  prismasol BGK 4/2.5 500 mL/hr at 06/13/20 0745  . sodium chloride 10 mL/hr at 06/14/20 0600  . calcium gluconate    . feeding supplement (VITAL AF 1.2 CAL) 60 mL/hr at 06/14/20 0400  . norepinephrine (LEVOPHED) Adult infusion Stopped (06/12/20 1720)  . phenylephrine (NEO-SYNEPHRINE) Adult infusion Stopped (06/10/20 1111)  . piperacillin-tazobactam (ZOSYN)  IV 3.375 g (06/14/20 0622)  . prismasol BGK 4/2.5 1,000 mL/hr at 06/13/20 0504  . sodium chloride    . vasopressin Stopped (06/12/20 1230)   . B-complex with vitamin C  1 tablet Per Tube Daily  . chlorhexidine  15 mL Mouth Rinse BID  . Chlorhexidine Gluconate Cloth  6 each Topical Daily  . feeding supplement (PROSource TF)  45 mL Per Tube Daily  . FLUoxetine  40 mg Per Tube Daily  . heparin injection (subcutaneous)  5,000 Units Subcutaneous Q8H  . insulin aspart  0-9 Units Subcutaneous Q4H  . mouth rinse  15 mL Mouth Rinse q12n4p  . pantoprazole sodium  40 mg Per Tube Q24H  . sodium chloride flush  10-40 mL Intracatheter Q12H   docusate, heparin, polyethylene glycol, sodium chloride, sodium chloride flush  Assessment/ Plan:  1.Acute kidney Injury: Normal creatinine at baseline but fluctuating in history of AKI.  Severe AKI with creatinine of 25 on arrival likely ATN.  No signs of hydronephrosis or glomerular disease.  Mild elevation in CPK possibly contributing.   - Anuric and interval rise in Cr - consulted IR for tunneled catheter for iHD  - made pt NPO - Will plan for HD after tunneled catheter - likely HD on 2/23  2. Shock/hypotension.  Considering cardiogenic and septic   Antibiotics per primary service. Pressors now off.  Start midodrine 10 mg TID  3. VDRF - recent mild covid.  Off vent on supp oxygen    4. Unresponsiveness  CT head and drug screen and airway management per CCM.    5. Hypophosphatemia: repleted; resolved; 2/2 CRRT   6. Hyperkalemia   resolved. With CRRT  7. Normocytic anemia - anticipate may need ESA; CBC in AM   Claudia Desanctis, MD 06/14/2020 8:01 AM

## 2020-06-14 NOTE — Progress Notes (Signed)
Patient to IR via bed with monitor and oxygen

## 2020-06-14 NOTE — Consult Note (Signed)
Chief Complaint: Ongoing dialysis acces. Request is for tunneled HD catheter placement  Referring Physician(s): Dr. Katheren Puller  Supervising Physician: Sandi Mariscal  Patient Status: [facility] - In-pt  History of Present Illness: [name] is a 50 y.o. female History of PTSD. Depression, opiate use. Presented to the ED at [facility]  After being found unresponsive with agonal breathing followed by asystole. CPR was performed and ROSC was achieved within 3 minutes. Patient was found to have sever acidosis, hypokalemia. AKI and shock. Patient had a RIJ temp HD catheter that was placed by ICU on 2.16.22 and removed on 2.22.22. team is requesting a tunneled HD catheter for ongoing dialysis access.  Currently without any significant complaints. Patient alert and laying in bed, calm and comfortable. Denies abdominal pain, nausea, vomiting or bleeding.Discussed care of HD catheter with patient and her husband. All questions were answered and concerns were addressed. Patient and her husband agree with plan.    Past Medical History:  Diagnosis Date  . Anxiety   . Arthritis    "joints ache all over" (10/15/2014)  . Bulging lumbar disc   . Chronic lower back pain   . DDD (degenerative disc disease), cervical   . Depression   . Drug-seeking behavior   . Headache    "weekly" (10/15/2014)  . Hyperlipemia   . Hypertension   . PTSD (post-traumatic stress disorder)   . Skin cancer    "had them cut off my arms; don't know what kind"    Past Surgical History:  Procedure Laterality Date  . ABLATION ON ENDOMETRIOSIS  2008  . BIOPSY  12/27/2018   Procedure: BIOPSY;  Surgeon: Thornton Park, MD;  Location: [facility] ENDOSCOPY;  Service: Gastroenterology;;  . ESOPHAGOGASTRODUODENOSCOPY (EGD) WITH PROPOFOL N/A 12/27/2018   Procedure: ESOPHAGOGASTRODUODENOSCOPY (EGD) WITH PROPOFOL;  Surgeon: Thornton Park, MD;  Location: [facility] ENDOSCOPY;  Service: Gastroenterology;  Laterality: N/A;  . HEMORRHOID SURGERY  ~ 2002   . ORIF ANKLE FRACTURE Right 03/28/2020   Procedure: OPEN REDUCTION INTERNAL FIXATION (ORIF) RIGHT BIMALLEOLAR ANKLE FRACTURE;  Surgeon: Marchia Bond, MD;  Location: [place];  Service: Orthopedics;  Laterality: Right;    Allergies: Lactose intolerance (gi)  Medications: Prior to Admission medications   Medication Sig Start Date End Date Taking? Authorizing Provider  ALPRAZOLAM PO Take 1 tablet by mouth daily.   Yes [provider]  ferrous sulfate 325 (65 FE) MG tablet Take 325 mg by mouth daily with breakfast.   Yes [provider]  FLUoxetine (PROZAC) 40 MG capsule Take 40 mg by mouth daily.  06/26/17  Yes [provider]  hydrochlorothiazide (HYDRODIURIL) 25 MG tablet Take 1 tablet (25 mg total) by mouth at bedtime. 05/14/20  Yes Thurnell Lose, MD  lamoTRIgine (LAMICTAL) 200 MG tablet Take 100-200 mg by mouth See admin instructions. 200mg  in the morning and 100mg  in the evening 04/29/20  Yes [provider]  lisinopril (ZESTRIL) 10 MG tablet Take 1 tablet (10 mg total) by mouth daily. 05/15/20  Yes Thurnell Lose, MD  omeprazole (PRILOSEC) 20 MG capsule Take 1 capsule (20 mg total) by mouth daily. 05/12/20  Yes Thurnell Lose, MD  risperiDONE (RISPERDAL) 0.5 MG tablet Take 0.5 mg by mouth 2 (two) times daily. 03/03/18  Yes [provider]  tiZANidine (ZANAFLEX) 4 MG tablet Take 12 mg by mouth 3 (three) times daily.   Yes [provider]  traZODone (DESYREL) 150 MG tablet Take 150 mg by mouth at bedtime.  06/03/17  Yes [provider]     Family History  Problem Relation Age of Onset  . Breast cancer Mother   . Diabetes Mother   . Breast cancer Maternal Grandmother   . Breast cancer Paternal Grandmother   . Colon polyps Paternal Grandmother   . Colon cancer Neg Hx   . Esophageal cancer Neg Hx   . Rectal cancer Neg Hx   . Stomach cancer Neg Hx     Social History   Socioeconomic History  .  Marital status: Married    Spouse name: Not on file  . Number of children: Not on file  . Years of education: Not on file  . Highest education level: Not on file  Occupational History  . Not on file  Tobacco Use  . Smoking status: Never Smoker  . Smokeless tobacco: Never Used  Vaping Use  . Vaping Use: Never used  Substance and Sexual Activity  . Alcohol use: Not Currently    Comment: 10/15/2014 "I've drank before; nothing regular; don't drink now cause of RX I'm on"  . Drug use: No  . Sexual activity: Not Currently  Other Topics Concern  . Not on file  Social History Narrative   [place]. Never smoked; quit alcohol 10 years; used to work for [employer]. NO IV drug abuse.    Social Determinants of Health   Financial Resource Strain: Not on file  Food Insecurity: Not on file  Transportation Needs: Not on file  Physical Activity: Not on file  Stress: Not on file  Social Connections: Not on file     Review of Systems: A 12 point ROS discussed and pertinent positives are indicated in the HPI above.  All other systems are negative.  Review of Systems  Constitutional: Negative for fatigue and fever.  HENT: Negative for congestion.   Respiratory: Negative for cough and shortness of breath.   Gastrointestinal: Negative for abdominal pain, diarrhea, nausea and vomiting.    Vital Signs: BP 99/62   Pulse 98   Temp 98 F (36.7 C) (Oral)   Resp 17   Ht 5\' 6"  (1.676 m)   Wt 197 lb 5 oz (89.5 kg)   LMP 06/03/2018 (Approximate) Comment: neg hcg 05/10/20  SpO2 97%   BMI 31.85 kg/m   Physical Exam Vitals and nursing note reviewed.  Constitutional:      Appearance: She is well-developed.  HENT:     Head: Normocephalic and atraumatic.  Eyes:     Conjunctiva/sclera: Conjunctivae normal.  Cardiovascular:     Rate and Rhythm: Normal rate and regular rhythm.     Heart sounds: Normal heart sounds.  Pulmonary:     Effort:  Pulmonary effort is normal.     Breath sounds: Normal breath sounds.  Musculoskeletal:        General: Normal range of motion.     Cervical back: Normal range of motion.  Skin:    General: Skin is warm.  Neurological:     Mental Status: She is alert and oriented to person, place, and time.     Imaging: CT Head Wo Contrast  Result Date: 06/08/2020 CLINICAL DATA:  Found on the floor. EXAM: CT HEAD WITHOUT CONTRAST TECHNIQUE: Contiguous axial images were obtained from the base of the skull through the vertex without intravenous contrast. COMPARISON:  03/21/2020 FINDINGS: Brain: The brain has a normal appearance without evidence of malformation, atrophy, old or acute small or large vessel infarction,  mass lesion, hemorrhage, hydrocephalus or extra-axial collection. Vascular: Major vessels at the base of the brain show flow. Venous sinuses appear patent. Skull and upper cervical spine: Normal. Sinuses/Orbits: Small amount of layering fluid in the right maxillary sinus. Other: None significant. IMPRESSION: 1. Normal appearance of the brain itself. 2. Small amount of layering fluid in the right maxillary sinus. Electronically Signed   By: Nelson Chimes M.D.   On: 06/08/2020 18:29   US RENAL  Result Date: 06/08/2020 CLINICAL DATA:  Acute renal insufficiency EXAM: RENAL / URINARY TRACT ULTRASOUND COMPLETE COMPARISON:  05/10/2020 FINDINGS: Right Kidney: Renal measurements: 8.5 x 4.7 x 4.2 cm = volume: 86.8 mL. Echogenicity within normal limits. No mass or hydronephrosis visualized. Left Kidney: Renal measurements: 10.3 x 4.5 x 4.2 cm = volume: 102.3 mL. Echogenicity within normal limits. No mass or hydronephrosis visualized. Bladder: Bladder is decompressed, limiting its evaluation. Other: None. IMPRESSION: Unremarkable renal ultrasound. Electronically Signed   By: Randa Ngo M.D.   On: 06/08/2020 22:42   DG Chest Port 1 View  Result Date: 06/11/2020 CLINICAL DATA:  Respiratory failure. EXAM:  PORTABLE CHEST 1 VIEW COMPARISON:  06/10/2020 FINDINGS: Left IJ catheter tip is at the cavoatrial junction. Right IJ catheter tip is in the distal SVC. ETT tip is just above the carina. Feeding tube tip is well below the GE junction and is either in the distal stomach or proximal duodenum. Stable cardiomediastinal contours. Low lung volumes with asymmetric elevation of the right hemidiaphragm. Atelectasis is noted in the right lung base. IMPRESSION: Low lung volumes with asymmetric elevation of the right hemidiaphragm and right base atelectasis. Electronically Signed   By: Kerby Moors M.D.   On: 06/11/2020 07:40   DG Chest Port 1 View  Result Date: 06/10/2020 CLINICAL DATA:  Respiratory failure EXAM: PORTABLE CHEST 1 VIEW COMPARISON:  06/09/2020 FINDINGS: Support devices are stable. Patient is rotated to the right, limiting study. Cardiomegaly. Bibasilar atelectasis, right greater than left. No visible effusions or acute bony abnormality. IMPRESSION: Limited study due to rotation. Bibasilar atelectasis. Support devices stable. Electronically Signed   By: Rolm Baptise M.D.   On: 06/10/2020 07:19   DG Chest Port 1 View  Result Date: 06/09/2020 CLINICAL DATA:  Respiratory failure. EXAM: PORTABLE CHEST 1 VIEW COMPARISON:  06/08/2020. FINDINGS: Endotracheal tube, left IJ line, right IJ line in stable position. Heart size stable. Low lung volumes with bibasilar atelectasis again noted. Bibasilar infiltrates cannot be excluded. Stable elevation right hemidiaphragm. No pleural effusion or pneumothorax. IMPRESSION: 1. Lines and tubes in stable position. 2. Low lung volumes with bibasilar atelectasis again noted. Bibasilar infiltrates cannot be excluded. Exam stable from prior exam. Electronically Signed   By: Marcello Moores  Register   On: 06/09/2020 05:26   DG CHEST PORT 1 VIEW  Result Date: 06/08/2020 CLINICAL DATA:  Central line placement. EXAM: PORTABLE CHEST 1 VIEW COMPARISON:  Radiograph 06/09/2019 FINDINGS:  Right-sided dialysis catheter tip in the mid SVC. Left central line and endotracheal tube remain in place. No pneumothorax. Elevation of the right hemidiaphragm with basilar atelectasis, unchanged. Possible small right pleural effusion. Stable heart size and mediastinal contours. IMPRESSION: Right-sided dialysis catheter tip in the mid SVC. No pneumothorax. Otherwise stable exam. Electronically Signed   By: Keith Rake M.D.   On: 06/08/2020 20:41   DG Chest Portable 1 View  Result Date: 06/08/2020 CLINICAL DATA:  Central catheter placement.  Hypoxia. EXAM: PORTABLE CHEST 1 VIEW COMPARISON:  June 08, 2020 study obtained earlier in the day  FINDINGS: Endotracheal tube tip is 3.2 cm above the carina. Central catheter tip is at the cavoatrial junction. No pneumothorax. There is atelectatic change in the right lower lung region with small right pleural effusion. There is stable elevation of the right hemidiaphragm. The left lung is clear. The heart size and pulmonary vascularity are normal. No adenopathy. No bone lesions IMPRESSION: Tube and catheter positions as described without pneumothorax. There is atelectatic change in the right lower lung region with small right pleural effusion. Left lung clear. Heart size within normal limits. Electronically Signed   By: Lowella Grip III M.D.   On: 06/08/2020 17:02   DG Chest Portable 1 View  Result Date: 06/08/2020 CLINICAL DATA:  Status post intubation. EXAM: PORTABLE CHEST 1 VIEW COMPARISON:  Single-view of the chest 05/10/2020. FINDINGS: New endotracheal tube is in place with the tip 2.3 cm above the carina. Defibrillator pad also noted. Mild subsegmental atelectasis is seen in the right lung base. Lungs otherwise clear. No pneumothorax or pleural fluid. IMPRESSION: ETT tip is 2.3 cm above the carina. Mild subsegmental atelectasis right lung base. Electronically Signed   By: Inge Rise M.D.   On: 06/08/2020 14:50   ECHOCARDIOGRAM  COMPLETE  Result Date: 06/09/2020    ECHOCARDIOGRAM REPORT   Patient Name:   ADISON REIFSTECK Date of Exam: 06/09/2020 Medical Rec #:  [id number]       Height:       66.0 in Accession #:    [id number]      Weight:       194.0 lb Date of Birth:  [birth year]      BSA:          1.974 m Patient Age:    51 years        BP:           122/78 mmHg Patient Gender: F               HR:           101 bpm. Exam Location:  Inpatient Procedure: 2D Echo Indications:    Cardiac Arrest I46.9  History:        Patient has prior history of Echocardiogram examinations, most                 recent 12/13/2014. Risk Factors:Hypertension and Dyslipidemia.  Sonographer:    Mikki Santee RDCS (AE) Referring Phys: [id number] VINEET SOOD IMPRESSIONS  1. Left ventricular ejection fraction, by estimation, is 55 to 60%. The left ventricle has normal function. The left ventricle has no regional wall motion abnormalities. Left ventricular diastolic parameters were normal.  2. Right ventricular systolic function is normal. The right ventricular size is normal.  3. The mitral valve is normal in structure. Trivial mitral valve regurgitation. No evidence of mitral stenosis.  4. The aortic valve is grossly normal. Aortic valve regurgitation is moderate. Comparison(s): Changes from prior study are noted. Now with moderate aortic regurgitation. FINDINGS  Left Ventricle: Left ventricular ejection fraction, by estimation, is 55 to 60%. The left ventricle has normal function. The left ventricle has no regional wall motion abnormalities. The left ventricular internal cavity size was normal in size. There is  no left ventricular hypertrophy. Left ventricular diastolic parameters were normal. Right Ventricle: The right ventricular size is normal. Right vetricular wall thickness was not well visualized. Right ventricular systolic function is normal. Left Atrium: Left atrial size was normal in size. Right Atrium: Right atrial size was normal in size. Pericardium:  There  is no evidence of pericardial effusion. Mitral Valve: The mitral valve is normal in structure. Trivial mitral valve regurgitation. No evidence of mitral valve stenosis. Tricuspid Valve: The tricuspid valve is normal in structure. Tricuspid valve regurgitation is trivial. No evidence of tricuspid stenosis. Aortic Valve: The aortic valve is grossly normal. Aortic valve regurgitation is moderate. Aortic regurgitation PHT measures 325 msec. Pulmonic Valve: The pulmonic valve was not well visualized. Pulmonic valve regurgitation is not visualized. No evidence of pulmonic stenosis. Aorta: The aortic root, ascending aorta and aortic arch are all structurally normal, with no evidence of dilitation or obstruction. Venous: IVC assessment for right atrial pressure unable to be performed due to mechanical ventilation. IAS/Shunts: The atrial septum is grossly normal.  LEFT VENTRICLE PLAX 2D LVIDd:         4.90 cm  Diastology LVIDs:         3.20 cm  LV e' medial:    8.38 cm/s LV PW:         0.80 cm  LV E/e' medial:  11.7 LV IVS:        0.90 cm  LV e' lateral:   10.80 cm/s LVOT diam:     1.90 cm  LV E/e' lateral: 9.1 LV SV:         52 LV SV Index:   26 LVOT Area:     2.84 cm  RIGHT VENTRICLE RV S prime:     18.80 cm/s TAPSE (M-mode): 1.8 cm LEFT ATRIUM             Index       RIGHT ATRIUM          Index LA diam:        2.80 cm 1.42 cm/m  RA Area:     8.38 cm LA Vol (A2C):   41.3 ml 20.92 ml/m RA Volume:   14.10 ml 7.14 ml/m LA Vol (A4C):   23.9 ml 12.11 ml/m LA Biplane Vol: 32.1 ml 16.26 ml/m  AORTIC VALVE LVOT Vmax:   115.00 cm/s LVOT Vmean:  77.600 cm/s LVOT VTI:    0.182 m AI PHT:      325 msec  AORTA Ao Root diam: 2.70 cm MITRAL VALVE MV Area (PHT): 3.08 cm     SHUNTS MV Decel Time: 246 msec     Systemic VTI:  0.18 m MV E velocity: 98.30 cm/s   Systemic Diam: 1.90 cm MV A velocity: 126.00 cm/s MV E/A ratio:  0.78 Buford Dresser MD Electronically signed by Buford Dresser MD Signature Date/Time:  06/09/2020/3:25:05 PM    Final     Labs:  CBC: Recent Labs    06/10/20 0359 06/10/20 0405 06/11/20 0306 06/12/20 0258 06/13/20 0328  WBC 17.6*  --  13.5* 10.1 8.8  HGB 11.2* 11.2* 9.8* 9.3* 9.5*  HCT 32.9* 33.0* 30.1* 29.0* 30.3*  PLT 185  --  138* 112* 103*    COAGS: Recent Labs    06/08/20 1847  INR 1.8*  APTT 39*    BMP: Recent Labs    06/12/20 1714 06/13/20 0328 06/13/20 1506 06/14/20 0423  NA 136 136 136 137  K 4.1 4.0 4.3 4.2  CL 100 101 100 98  CO2 26 26 23 24   GLUCOSE 184* 186* 232* 189*  BUN 18 20 36* 58*  CALCIUM 7.9* 7.7* 7.7* 7.8*  CREATININE 1.46* 1.38* 2.29* 3.34*  GFRNONAA 44* 47* 26* 16*    LIVER FUNCTION TESTS: Recent Labs    05/11/20  0114 05/12/20 0042 06/08/20 1423 06/08/20 1944 06/09/20 0440 06/09/20 1730 06/12/20 1714 06/13/20 0328 06/13/20 1506 06/14/20 0423  BILITOT 0.8 0.4 0.9  --  1.1  --   --   --   --   --   AST 26 30 93*  --  172*  --   --   --   --   --   ALT 20 24 26   --  41  --   --   --   --   --   ALKPHOS 62 50 109  --  107  --   --   --   --   --   PROT 6.8 5.6* 6.6  --  6.3*  --   --   --   --   --   ALBUMIN 3.5 2.9* 2.4*   < > 2.7*   < > 2.4* 2.1* 2.2* 2.0*   < > = values in this interval not displayed.    Assessment and Plan:  50 y.o. female inpatient. History of PTSD. Depression, opiate use. Present[facility] ED at [facility]  After being found unresponsive with agonal breathing followed by asystole. CPR was performed and ROSC was achieved within 3 minutes. Patient was found to have sever acidosis, hypokalemia. AKI and shock. Patient had a RIJ temp HD catheter that was placed by ICU on 2.16.22 and removed on 2.22.22. team is requesting a tunneled HD catheter for ongoing dialysis access.  Patient has a LIJ triple lumen catheter in place. BUN 58, Cr 3.34, WBC is 8.8, Blood cultures negative X 5 days. No pertinent allergies. Patient has been NPO since midnight.   Risks and benefits discussed with the patient including,  but not limited to bleeding, infection, vascular injury, pneumothorax which may require chest tube placement, air embolism or even death  All of the patient's and husband's questions were answered, patient is agreeable to proceed. Consent signed and in chart.   Thank you for this interesting consult.  I greatly enjoyed meeting JORDON BOURQUIN and look forward to participating in their care.  A copy of this report was sent to the requesting provider on this date.  Electronically Signed: Jacqualine Mau, NP 06/14/2020, 10:09 AM   I spent a total of 40 Minutes    in face to face in clinical consultation, greater than 50% of which was counseling/coordinating care for Tunneled HD catheter

## 2020-06-14 NOTE — Procedures (Signed)
Pre-procedure Diagnosis: ESRD Post-procedure Diagnosis: Same  Successful placement of tunneled HD catheter with tips terminating within the superior aspect of the right atrium.    Complications: None Immediate  EBL: None   The catheter is ready for immediate use.   Jay Joyce Leckey, MD Pager #: [phone]   

## 2020-06-14 NOTE — Progress Notes (Signed)
SLP Cancellation Note  Patient Details Name: [name] MRN: [id number] DOB: Oct 10, 1970   Cancelled treatment:        Attempted to see pt for ongoing dysphagia management.  Pt NPO for procedure in IR at time of attempt.  SLP will reattempt as schedule permits when pt is cleared for PO trials.    Celedonio Savage, MA, [place] Office: [phone]  06/14/2020, 11:59 AM

## 2020-06-14 NOTE — Progress Notes (Signed)
[place] for Restarting Anticoagulants/Antiplatelet Medications Post-IR Procedure Indication: VTE prophylaxis  Allergies  Allergen Reactions  . Lactose Intolerance (Gi) Nausea Only    Patient Measurements: Height: 5\' 6"  (167.6 cm) Weight: 89.5 kg (197 lb 5 oz) IBW/kg (Calculated) : 59.3  Vital Signs: Temp: 98.7 F (37.1 C) (02/22 1105) Temp Source: Oral (02/22 1105) BP: 107/68 (02/22 1535) Pulse Rate: 86 (02/22 1535)  Labs: Recent Labs    06/12/20 0258 06/12/20 1714 06/13/20 0328 06/13/20 1506 06/14/20 0423  HGB 9.3*  --  9.5*  --   --   HCT 29.0*  --  30.3*  --   --   PLT 112*  --  103*  --   --   CREATININE 1.48*   < > 1.38* 2.29* 3.34*   < > = values in this interval not displayed.    Estimated Creatinine Clearance: 23 mL/min (A) (by C-G formula based on SCr of 3.34 mg/dL (H)).   Medical History: Past Medical History:  Diagnosis Date  . Anxiety   . Arthritis    "joints ache all over" (10/15/2014)  . Bulging lumbar disc   . Chronic lower back pain   . DDD (degenerative disc disease), cervical   . Depression   . Drug-seeking behavior   . Headache    "weekly" (10/15/2014)  . Hyperlipemia   . Hypertension   . PTSD (post-traumatic stress disorder)   . Skin cancer    "had them cut off my arms; don't know what kind"    Assessment: 50 yr old female admitted on 06/08/20 with cardiac arrest and cardiogenic shock, likely from opiate overdose; and AKI from ATN in setting of shock and hypoxia. PT was placed on CRRT, but had issues with catheter; Nephrology ordered placement of tunneled catheter by IR. Pt had successful placement of tunneled HD catheter this afternoon (completed ~1600 today).   Pt was receiving heparin 5000 units SQ Q 8 hrs for VTE prophylaxis prior to IR procedure (last dose at 0621 today). Pharmacy is consulted to resume anticoagulants and antiplatelet medications post-IR procedure, per [facility] protocol. Per  consult info, procedure is considered standing bleeding risk procedure.  H/H 9.5/30.3, plt 103; per RN, no issues with bleeding observed post-IR procedure.  Goal of Therapy:  Prevention of VTE Monitor platelets by anticoagulation protocol: Yes   Plan:  Resume heparin 5000 units SQ Q 8 hrs at 2200 tonight, per [place] protocol  Monitor CBC Monitor for bleeding  Gillermina Hu, PharmD, BCPS, [facility] Clinical Pharmacist 06/14/2020,4:05 PM

## 2020-06-15 ENCOUNTER — Inpatient Hospital Stay (HOSPITAL_COMMUNITY): Payer: Medicare Other

## 2020-06-15 ENCOUNTER — Encounter (HOSPITAL_COMMUNITY): Payer: Self-pay

## 2020-06-15 DIAGNOSIS — J9601 Acute respiratory failure with hypoxia: Secondary | ICD-10-CM | POA: Diagnosis not present

## 2020-06-15 LAB — CBC
HCT: 22.8 % — ABNORMAL LOW (ref 36.0–46.0)
Hemoglobin: 7.4 g/dL — ABNORMAL LOW (ref 12.0–15.0)
MCH: 31.4 pg (ref 26.0–34.0)
MCHC: 32.5 g/dL (ref 30.0–36.0)
MCV: 96.6 fL (ref 80.0–100.0)
Platelets: 152 10*3/uL (ref 150–400)
RBC: 2.36 MIL/uL — ABNORMAL LOW (ref 3.87–5.11)
RDW: 14.2 % (ref 11.5–15.5)
WBC: 9.1 10*3/uL (ref 4.0–10.5)
nRBC: 0 % (ref 0.0–0.2)

## 2020-06-15 LAB — GLUCOSE, CAPILLARY
Glucose-Capillary: 166 mg/dL — ABNORMAL HIGH (ref 70–99)
Glucose-Capillary: 184 mg/dL — ABNORMAL HIGH (ref 70–99)
Glucose-Capillary: 188 mg/dL — ABNORMAL HIGH (ref 70–99)
Glucose-Capillary: 189 mg/dL — ABNORMAL HIGH (ref 70–99)
Glucose-Capillary: 195 mg/dL — ABNORMAL HIGH (ref 70–99)
Glucose-Capillary: 203 mg/dL — ABNORMAL HIGH (ref 70–99)

## 2020-06-15 LAB — RENAL FUNCTION PANEL
Albumin: 2 g/dL — ABNORMAL LOW (ref 3.5–5.0)
Albumin: 1.9 g/dL — ABNORMAL LOW (ref 3.5–5.0)
Anion gap: 15 (ref 5–15)
Anion gap: 18 — ABNORMAL HIGH (ref 5–15)
BUN: 58 mg/dL — ABNORMAL HIGH (ref 6–20)
BUN: 90 mg/dL — ABNORMAL HIGH (ref 6–20)
CO2: 24 mmol/L (ref 22–32)
CO2: 22 mmol/L (ref 22–32)
Calcium: 7.8 mg/dL — ABNORMAL LOW (ref 8.9–10.3)
Calcium: 8.2 mg/dL — ABNORMAL LOW (ref 8.9–10.3)
Chloride: 98 mmol/L (ref 98–111)
Chloride: 96 mmol/L — ABNORMAL LOW (ref 98–111)
Creatinine, Ser: 3.34 mg/dL — ABNORMAL HIGH (ref 0.44–1.00)
Creatinine, Ser: 4.93 mg/dL — ABNORMAL HIGH (ref 0.44–1.00)
GFR, Estimated: 16 mL/min — ABNORMAL LOW (ref >60)
GFR, Estimated: 10 mL/min — ABNORMAL LOW (ref >60)
Glucose, Bld: 189 mg/dL — ABNORMAL HIGH (ref 70–99)
Glucose, Bld: 205 mg/dL — ABNORMAL HIGH (ref 70–99)
Phosphorus: 4.1 mg/dL (ref 2.5–4.6)
Phosphorus: 4.4 mg/dL (ref 2.5–4.6)
Potassium: 4.2 mmol/L (ref 3.5–5.1)
Potassium: 5.1 mmol/L (ref 3.5–5.1)
Sodium: 137 mmol/L (ref 135–145)
Sodium: 136 mmol/L (ref 135–145)

## 2020-06-15 LAB — IRON AND TIBC
Iron: 66 ug/dL (ref 28–170)
Saturation Ratios: 39 % — ABNORMAL HIGH (ref 10.4–31.8)
TIBC: 168 ug/dL — ABNORMAL LOW (ref 250–450)
UIBC: 102 ug/dL

## 2020-06-15 LAB — MAGNESIUM: Magnesium: 2.2 mg/dL (ref 1.7–2.4)

## 2020-06-15 LAB — HEPATITIS B SURFACE ANTIBODY,QUALITATIVE: Hep B S Ab: NONREACTIVE

## 2020-06-15 LAB — FERRITIN: Ferritin: 394 ng/mL — ABNORMAL HIGH (ref 11–307)

## 2020-06-15 LAB — HEPATITIS B SURFACE ANTIGEN: Hepatitis B Surface Ag: NONREACTIVE

## 2020-06-15 LAB — HEPATITIS B CORE ANTIBODY, TOTAL: Hep B Core Total Ab: NONREACTIVE

## 2020-06-15 MED ORDER — LIDOCAINE HCL (PF) 1 % IJ SOLN: 5.0000 mL | INTRAMUSCULAR | Status: DC | PRN

## 2020-06-15 MED ORDER — ALTEPLASE 2 MG IJ SOLR: 2.0000 mg | Freq: Once | INTRAMUSCULAR | Status: DC | PRN

## 2020-06-15 MED ORDER — PIPERACILLIN-TAZOBACTAM IN DEX 2-0.25 GM/50ML IV SOLN
2.2500 g | Freq: Three times a day (TID) | INTRAVENOUS | Status: DC
  Filled 2020-06-15: qty 50

## 2020-06-15 MED ORDER — SODIUM CHLORIDE 0.9 % IV SOLN: 100.0000 mL | INTRAVENOUS | Status: DC | PRN

## 2020-06-15 MED ORDER — PENTAFLUOROPROP-TETRAFLUOROETH EX AERO: 1.0000 "application " | INHALATION_SPRAY | CUTANEOUS | Status: DC | PRN

## 2020-06-15 MED ORDER — LIDOCAINE-PRILOCAINE 2.5-2.5 % EX CREA: 1.0000 "application " | TOPICAL_CREAM | CUTANEOUS | Status: DC | PRN

## 2020-06-15 MED ORDER — DARBEPOETIN ALFA 40 MCG/0.4ML IJ SOSY
40.0000 ug | PREFILLED_SYRINGE | INTRAMUSCULAR | Status: DC
  Filled 2020-06-15: qty 0.4

## 2020-06-15 MED ORDER — HEPARIN SODIUM (PORCINE) 1000 UNIT/ML DIALYSIS: 1000.0000 [IU] | INTRAMUSCULAR | Status: DC | PRN

## 2020-06-15 MED ORDER — ALBUMIN HUMAN 25 % IV SOLN
INTRAVENOUS | Status: AC
  Administered 2020-06-15: 25 g via INTRAVENOUS
  Filled 2020-06-15: qty 100

## 2020-06-15 MED ORDER — ALBUMIN HUMAN 25 % IV SOLN: 25.0000 g | Freq: Once | INTRAVENOUS | Status: AC

## 2020-06-15 NOTE — Progress Notes (Addendum)
NAME:  [name], MRN:  [id number], DOB:  January 06, 1971, LOS: 7 ADMISSION DATE:  06/08/2020, CONSULTATION DATE:  06/08/2020 REFERRING MD:  Dr. Karle Starch, ER, CHIEF COMPLAINT:  Cardiac arrest   Brief History:  50 y/o female with hx of opiate use, PTSD, and depression who presented to [facility] 2/16 after being found laying on the floor for several hours.  Found by EMS to have agonal respiratory pattern and then asystole. King airway placed and received epinephrine with 3 minutes of CPR before ROSC achieved.  In ER found to have severe acidosis, hypokalemia, acute renal failure, hypothermia, and shock.  UDS positive for amphetamines (not prescribed).  Past Medical History:  PTSD, Depression, HTN, HLD, Headache, Opiate dependence, Back pain, Anxiety, Osteoarthritis, COVID 19 infection January 2022  Significant Hospital Events:  2/16 Admit, UDS + amphetamines, asystole / CPR.  Started CRRT 2/20 PSV wean > extubated.  Remains on pressors, CRRT.   2/22 Anuric, pending perm cath placement per IR.    Consults:  Nephrology  Procedures:  ETT 2/16 >> 2/20 Rt IJ HD cath 2/16 >> 2/22 Lt IJ CVL 2/16 >> 2/23  Rt femoral arterial line 2/18 >> 2/21 R Perm Cath (per IR) 2/22 >>   Significant Diagnostic Tests:   CT head 2/16 >> normal brain   Renal u/s 2/16 >> unremarkable  ECHO 2/17 >> EF 55-60%, no RWMA  Micro Data:  BCx2 (HD cath) 2/16 >> negative  BCx2 2/16 >> negative  MRSA PCR 2/16 >> positive  Antimicrobials:  Zosyn 2/16 >>  2/22  Interim History / Subjective:  Afebrile  2L O2  Glucose range 131-232 I/O UOP zero, +474ml in last 24 hours Pt reports she feels depressed   Objective   Blood pressure (!) 112/54, pulse 93, temperature 98.6 F (37 C), temperature source Axillary, resp. rate 11, height 5\' 6"  (1.676 m), weight 92.5 kg, last menstrual period 06/03/2018, SpO2 96 %.    FiO2 (%):  [30 %] 30 %   Intake/Output Summary (Last 24 hours) at 06/15/2020 0904 Last data filed at  06/15/2020 7829 Gross per 24 hour  Intake 654.03 ml  Output --  Net 654.03 ml   Filed Weights   06/13/20 0500 06/14/20 0422 06/15/20 0420  Weight: 87.5 kg 89.5 kg 92.5 kg    Examination: General: adult female sitting up in bed in NAD, husband at bedside filing her nails HEENT: MM pink/moist, small bore feeding tube in place, anicteric Neuro: awake/alert, generalized weakness but MAE PSY: flat affect CV: s1s2 RRR, no m/r/g PULM: non-labored on [place] O2, lungs bilaterally clear GI: soft, bsx4 active  Extremities: warm/dry, trace dependent edema  Skin: no rashes or lesions  Resolved Hospital Problem list   Anion gap metabolic acidosis with lactic acidosis, Hyperkalemia with ECG changes, Cardiac arrest with asystole from opiate/amphetamine OD, Hypothermia, Hypocalcemia, Rhabdomyolysis, septic shock  Assessment & Plan:   Aspiration pneumonitis. Completed zosyn 2/22.  -pulmonary hygiene - IS, mobilize  -follow CXR   Acute hypoxic respiratory failure with compromised airway in setting of cardiac arrest and aspiration pneumonitis Extubated 2/20.  -wean O2 to off for sats >90% -mobilize, pulmonary hygiene   Acute metabolic encephalopathy 2nd to hypoxia, renal failure, sepsis Hx of depression, PTSD, anxiety, chronic opiate use HIV negative. UDS positive for amphetamines on admit (not prescribed). Hx of prior suicide attempts.  Not clear purposeful on this admit but does raise concern given hx.  -appreciate PSY evaluation > initiated Abilify 2/22, monitor QTc -PSY rec's for  outpatient substance abuse program, IOP -continue prozac -? If she would benefit from outpatient ECT -hold home lamictal, risperdal, zanaflex, trazodone, xanax > no evidence of withdrawal -suspect she will need dose reductions if restarted at all on above medications based on family description of her being sedate / staying in bed   AKI from ATN in setting of shock and hypoxia Hypophosphatemia -appreciate  Nephrology assistance with patient care  -continue midodrine 10 mg TID  -will observe in ICU for first intermittent HD given soft pressures / midodrine use -Trend BMP / urinary output -Replace electrolytes as indicated -Avoid nephrotoxic agents, ensure adequate renal perfusion -family understands that the longer she goes without renal recovery, the less likely she will have return of function.  May impact care needs moving forward (?can she go home).   Hx of HTN -hold home lisinopril, HCTZ  Hyperglycemia HbA1C 6.1 from 2/16 -SSI, sensitive scale    Dysphagia Failed SLP evaluation 2/21 -appreciate SLP evaluation  -continue cortrak in the interim for TF -pending modified swallow eval 2/23   Anemia of critical illness Mild thrombocytopenia, likely from sepsis -follow CBC -transfuse for Hgb <7%   Deconditioning due to Severe Depression  Has been in bed essentially since November 2021 -PT / OT  IV Access  -establish PIV  -remove central line 2/23  Best practice (evaluated daily)  Diet: tube feeds DVT prophylaxis: SQ heparin GI prophylaxis: protonix Mobility: PT/OT, as tolerated  Disposition: To [facility] as of 2/24, PCU after HD   Goals of Care:  Last date of multidisciplinary goals of care discussion: pending Family and staff present: pending Summary of discussion: pending Follow up goals of care discussion due: pending Code Status: Full Code   Family: Husband updated at bedside 2/23 on plan of care.    Labs:   CMP Latest Ref Rng & Units 06/15/2020 06/14/2020 06/13/2020  Glucose 70 - 99 mg/dL 205(H) 189(H) 232(H)  BUN 6 - 20 mg/dL 90(H) 58(H) 36(H)  Creatinine 0.44 - 1.00 mg/dL 4.93(H) 3.34(H) 2.29(H)  Sodium 135 - 145 mmol/L 136 137 136  Potassium 3.5 - 5.1 mmol/L 5.1 4.2 4.3  Chloride 98 - 111 mmol/L 96(L) 98 100  CO2 22 - 32 mmol/L 22 24 23   Calcium 8.9 - 10.3 mg/dL 8.2(L) 7.8(L) 7.7(L)  Total Protein 6.5 - 8.1 g/dL - - -  Total Bilirubin 0.3 - 1.2 mg/dL - - -   Alkaline Phos 38 - 126 U/L - - -  AST 15 - 41 U/L - - -  ALT 0 - 44 U/L - - -    CBC Latest Ref Rng & Units 06/15/2020 06/13/2020 06/12/2020  WBC 4.0 - 10.5 K/uL 9.1 8.8 10.1  Hemoglobin 12.0 - 15.0 g/dL 7.4(L) 9.5(L) 9.3(L)  Hematocrit 36.0 - 46.0 % 22.8(L) 30.3(L) 29.0(L)  Platelets 150 - 400 K/uL 152 103(L) 112(L)    ABG    Component Value Date/Time   PHART 7.485 (H) 06/10/2020 0405   PCO2ART 31.9 (L) 06/10/2020 0405   PO2ART 71 (L) 06/10/2020 0405   HCO3 24.2 06/10/2020 0405   TCO2 25 06/10/2020 0405   ACIDBASEDEF 10.0 (H) 06/08/2020 2238   O2SAT 96.0 06/10/2020 0405    CBG (last 3)  Recent Labs    06/14/20 2355 06/15/20 0346 06/15/20 0817  GLUCAP 194* 188* 184*    Critical care time:      Noe Gens, MSN, APRN, NP-C, AGACNP-BC [facility] 06/15/2020, 9:04 AM   Please see [email or web address] for pager  details.   From 7A-7P if no response, please call [phone] After hours, please call ELink [phone]

## 2020-06-15 NOTE — Progress Notes (Signed)
Physical Therapy Treatment Patient Details Name: [name] MRN: [id number] DOB: [birth year] Today's Date: 06/15/2020    History of Present Illness 50 yo 2/16 after fell out of bed and family checked on her later and found unresponsive s/p arrest and CPR with intubation. ?overdose suspected. CRRT 2/16-2/20. Extubated 2/20. PMhx: HTN, HLD, anxiety/depression, PTSD, Covid + 1/18, Rt ankle fx s/p ORIF, endometriosis    PT Comments    Patient soon to have dialysis and therefore worked at [employer] and returned to bed at end of session. Patient progressed from mod assist to min assist of 1 for sit to stand with multiple reps. Standing up to 60 seconds with posterior bias and unable to advance to marching due to imbalance. Able to laterally scoot along EOB x 3 to return to [facility] with min guard assist for lines and safety. Making steady progress.     Follow Up Recommendations  SNF;Supervision/Assistance - 24 hour;CIR (pending progression, cognition and family assist)     Equipment Recommendations  None recommended by PT    Recommendations for Other Services       Precautions / Restrictions Precautions Precautions: Fall;Other (comment) Precaution Comments: cortrak, O2    Mobility  Bed Mobility Overal bed mobility: Needs Assistance Bed Mobility: Supine to Sit;Sit to Supine;Rolling Rolling: Supervision   Supine to sit: HOB elevated;Min assist Sit to supine: Min guard   General bed mobility comments: HOB 35 degrees with cues for sequence and increased time to transfers, guarding to monitor safety and lines    Transfers Overall transfer level: Needs assistance Equipment used: 1 person hand held assist Transfers: Sit to/from Stand Sit to Stand: Min assist;Mod assist         General transfer comment: initial attempt mod assist and achieved 3/4 standing with noted RLE weakness; remaining transfers with rt knee blocked and stood with min assist; stood for 60 seconds each time; returned  to bed for dialysis  Ambulation/Gait             General Gait Details: not yet able   Stairs             Wheelchair Mobility    Modified Rankin (Stroke Patients Only)       Balance Overall balance assessment: Needs assistance Sitting-balance support: No upper extremity supported;Feet supported Sitting balance-Leahy Scale: Fair Sitting balance - Comments: EOB without UE support   Standing balance support: Bilateral upper extremity supported Standing balance-Leahy Scale: Poor Standing balance comment: bil UE support with tactile cues in standing                            Cognition Arousal/Alertness: Awake/alert Behavior During Therapy: Flat affect Overall Cognitive Status: Impaired/Different from baseline Area of Impairment: Memory;Following commands;Safety/judgement;Problem solving                 Orientation Level:  (NT)   Memory: Decreased short-term memory;Decreased recall of precautions Following Commands: Follows one step commands inconsistently;Follows one step commands with increased time Safety/Judgement: Decreased awareness of safety;Decreased awareness of deficits   Problem Solving: Slow processing;Decreased initiation;Requires verbal cues        Exercises      General Comments General comments (skin integrity, edema, etc.): VSS; MAP 80-81 throughout      Pertinent Vitals/Pain Pain Assessment: Faces Faces Pain Scale: No hurt Pain Location: generalized Pain Descriptors / Indicators: Discomfort    Home Living  Prior Function            PT Goals (current goals can now be found in the care plan section) Acute Rehab PT Goals Patient Stated Goal: return home Time For Goal Achievement: 06/27/20 Potential to Achieve Goals: Fair Progress towards PT goals: Progressing toward goals    Frequency    Min 3X/week      PT Plan Current plan remains appropriate    Co-evaluation               AM-PAC PT "6 Clicks" Mobility   Outcome Measure  Help needed turning from your back to your side while in a flat bed without using bedrails?: A Little Help needed moving from lying on your back to sitting on the side of a flat bed without using bedrails?: A Little Help needed moving to and from a bed to a chair (including a wheelchair)?: A Lot Help needed standing up from a chair using your arms (e.g., wheelchair or bedside chair)?: A Little Help needed to walk in hospital room?: Total Help needed climbing 3-5 steps with a railing? : Total 6 Click Score: 13    End of Session Equipment Utilized During Treatment: Oxygen;Gait belt Activity Tolerance: Patient tolerated treatment well Patient left: with call bell/phone within reach;in bed;with bed alarm set Nurse Communication: Mobility status;Precautions PT Visit Diagnosis: Other abnormalities of gait and mobility (R26.89);Difficulty in walking, not elsewhere classified (R26.2);Muscle weakness (generalized) (M62.81)     Time: 3888-2800 PT Time Calculation (min) (ACUTE ONLY): 22 min  Charges:  $Therapeutic Activity: 8-22 mins                      Arby Barrette, PT Pager [phone]    Rexanne Mano 06/15/2020, 12:00 PM

## 2020-06-15 NOTE — Progress Notes (Signed)
[facility] ROUNDING NOTE   Subjective:   no uop charted.  Taken off CRRT on 2/21 per catheter malfunction.  Had tunneled catheter on 2/22 with IR.  Still in ICU. She was seen by psych yesterday.  Review of systems:  Denies shortness of breath or chest pain  Denies n/v or diarrhea Just thirsty  ------------------ Brief History: 50 y.o. female. With a significant history of hypertension, endometriosis and depression that was seen in the IR 05/10/20 for abdominal pain and was found to have Covid 19 infection   She was mildly symptomatic and was discharged home on 05/12/20. She apparently fell out of bed during the night and was found to be in asystole and she was intubated and underwent chest compressions as well as EPI with ROSC. Thought that cardiac arrest was related to opioid overdose. AKI now requiring CRRT starting 2/16 likely 2/2 ATN.   In the ER, she was seen by Dr Gwenlyn Found, Cardiology and 2 D Echo performed   She had a preserved ejection fraction and no wall motion abnormality. Her blood pressure was 58/33  Pulse was 87 and she was hypothermic at 92 degrees.   Objective:  Vital signs in last 24 hours:  Temp:  [98 F (36.7 C)-99 F (37.2 C)] 99 F (37.2 C) (02/23 0347) Pulse Rate:  [76-100] 97 (02/23 0600) Resp:  [12-23] 17 (02/23 0600) BP: (80-116)/(47-74) 109/68 (02/23 0600) SpO2:  [92 %-99 %] 97 % (02/23 0600) FiO2 (%):  [30 %] 30 % (02/22 2000) Weight:  [92.5 kg] 92.5 kg (02/23 0420)  Weight change: 3 kg Filed Weights   06/13/20 0500 06/14/20 0422 06/15/20 0420  Weight: 87.5 kg 89.5 kg 92.5 kg    Intake/Output: I/O last 3 completed shifts: In: 520.4 [I.V.:198.1; NG/GT:180; IV Piggyback:142.3] Out: -    Intake/Output this shift:  No intake/output data recorded.  General adult female in bed in no acute distress  HEENT normocephalic atraumatic extraocular movements intact sclera anicteric Neck supple trachea midline Lungs clear to auscultation  bilaterally normal work of breathing at rest on nasal cannula Heart S1S2 no rub Abdomen soft nontender nondistended Extremities no edema  Neuro - more awake and interactive today Psych no anxiety or agitation   Basic Metabolic Panel: Recent Labs  Lab 06/11/20 1630 06/12/20 0258 06/12/20 1714 06/13/20 0328 06/13/20 1506 06/14/20 0423 06/15/20 0414  NA 136 134* 136 136 136 137 136  K 4.1 4.0 4.1 4.0 4.3 4.2 5.1  CL 102 101 100 101 100 98 96*  CO2 _0 GLUCOSE 166* 232* 184* 186* 232* 189* 205*  BUN _1 36* 58* 90*  CREATININE 1.48* 1.48* 1.46* 1.38* 2.29* 3.34* 4.93*  CALCIUM 7.8* 7.6* 7.9* 7.7* 7.7* 7.8* 8.2*  MG 2.4 2.2  --  2.1  --  2.1 2.2  PHOS 1.0* 1.5* 2.5 1.8* 3.7 4.1 4.4    Liver Function Tests: Recent Labs  Lab 06/08/20 1423 06/08/20 1944 06/09/20 0440 06/09/20 1730 06/12/20 1714 06/13/20 0328 06/13/20 1506 06/14/20 0423 06/15/20 0414  AST 93*  --  172*  --   --   --   --   --   --   ALT 26  --  41  --   --   --   --   --   --   ALKPHOS 109  --  107  --   --   --   --   --   --  BILITOT 0.9  --  1.1  --   --   --   --   --   --   PROT 6.6  --  6.3*  --   --   --   --   --   --   ALBUMIN 2.4*   < > 2.7*   < > 2.4* 2.1* 2.2* 2.0* 1.9*   < > = values in this interval not displayed.   No results for input(s): LIPASE, AMYLASE in the last 168 hours. No results for input(s): AMMONIA in the last 168 hours.  CBC: Recent Labs  Lab 06/10/20 0359 06/10/20 0405 06/11/20 0306 06/12/20 0258 06/13/20 0328 06/15/20 0414  WBC 17.6*  --  13.5* 10.1 8.8 9.1  HGB 11.2* 11.2* 9.8* 9.3* 9.5* 7.4*  HCT 32.9* 33.0* 30.1* 29.0* 30.3* 22.8*  MCV 90.9  --  94.4 94.8 97.7 96.6  PLT 185  --  138* 112* 103* 152    Cardiac Enzymes: Recent Labs  Lab 06/08/20 1847  CKTOTAL 5,564*    BNP: Invalid input(s): POCBNP  CBG: Recent Labs  Lab 06/14/20 1101 06/14/20 1617 06/14/20 2010 06/14/20 2355 06/15/20 0346  GLUCAP 131* 118* 197* 28*  188*    Microbiology: Results for orders placed or performed during the hospital encounter of 06/08/20  MRSA PCR Screening     Status: Abnormal   Collection Time: 06/08/20  8:44 PM   Specimen: Nasopharyngeal  Result Value Ref Range Status   MRSA by PCR POSITIVE (A) NEGATIVE Final    Comment:        The GeneXpert MRSA Assay (FDA approved for NASAL specimens only), is one component of a comprehensive MRSA colonization surveillance program. It is not intended to diagnose MRSA infection nor to guide or monitor treatment for MRSA infections. RESULT CALLED TO, READ BACK BY AND VERIFIED WITH: Ferrel Logan 06/08/20 2359 JDW Performed at [facility], [address] [place], [place] [address]   Blood culture (routine x 2)     Status: None   Collection Time: 06/08/20  9:25 PM   Specimen: BLOOD  Result Value Ref Range Status   Specimen Description BLOOD HEMODIALYSIS CATHETER  Final   Special Requests   Final    BOTTLES DRAWN AEROBIC AND ANAEROBIC Blood Culture results may not be optimal due to an excessive volume of blood received in culture bottles   Culture   Final    NO GROWTH 5 DAYS Performed at [facility], [place] [address] [place], [place] [address]    Report Status 06/13/2020 FINAL  Final  Blood culture (routine x 2)     Status: None   Collection Time: 06/08/20  9:25 PM   Specimen: BLOOD LEFT WRIST  Result Value Ref Range Status   Specimen Description BLOOD LEFT WRIST  Final   Special Requests   Final    BOTTLES DRAWN AEROBIC ONLY Blood Culture adequate volume   Culture   Final    NO GROWTH 5 DAYS Performed at [facility], [place] [address], [place], [place] [address]    Report Status 06/14/2020 FINAL  Final    Coagulation Studies: No results for input(s): LABPROT, INR in the last 72 hours.  Urinalysis: No results for input(s): COLORURINE, LABSPEC, PHURINE, GLUCOSEU, HGBUR, BILIRUBINUR, KETONESUR, PROTEINUR, UROBILINOGEN, NITRITE, LEUKOCYTESUR in the last  72 hours.  Invalid input(s): APPERANCEUR    Imaging: IR US Guide Vasc Access Right  Result Date: 06/14/2020 INDICATION: End-stage renal disease.  In need of durable intravenous access for continuation hemodialysis. Patient with history placement of a non tunneled/temporary right jugular approach dialysis catheter, recently removed. EXAM: TUNNELED CENTRAL VENOUS HEMODIALYSIS CATHETER PLACEMENT WITH ULTRASOUND AND FLUOROSCOPIC GUIDANCE MEDICATIONS: Patient is currently admitted to the hospital receiving intravenous antibiotics. The antibiotic was given in an appropriate time interval prior to skin puncture. ANESTHESIA/SEDATION: None FLUOROSCOPY TIME:  18 seconds (4 mGy) COMPLICATIONS: None immediate. PROCEDURE: Informed written consent was obtained from the patient after a discussion of the risks, benefits, and alternatives to treatment. Questions regarding the procedure were encouraged and answered. The right neck and chest were prepped with chlorhexidine in a sterile fashion, and a sterile drape was applied covering the operative field. Maximum barrier sterile technique with sterile gowns and gloves were used for the procedure. A timeout was performed prior to the initiation of the procedure. After creating a small venotomy incision, a micropuncture kit was utilized to access the internal jugular vein. Note was made of a minimal amount nonocclusive thrombus within the right internal jugular vein, likely the sequela of recent dialysis catheter placement. Real-time ultrasound guidance was utilized for vascular access including the acquisition of a permanent ultrasound image documenting patency of the accessed vessel. The microwire was utilized to measure appropriate catheter length. A stiff Glidewire was advanced to the level of the IVC and the micropuncture sheath was exchanged for a peel-away sheath. A palindrome tunneled hemodialysis catheter measuring 19 cm from tip to cuff was tunneled in a retrograde  fashion from the anterior chest wall to the venotomy incision. The catheter was then placed through the peel-away sheath with tips ultimately positioned within the superior aspect of the right atrium. Final catheter positioning was confirmed and documented with a spot radiographic image. The catheter aspirates and flushes normally. The catheter was flushed with appropriate volume heparin dwells. The catheter exit site was secured with a 0-Prolene retention suture. The venotomy incision was closed with Dermabond and Steri-strips. Dressings were applied. The patient tolerated the procedure well without immediate post procedural complication. IMPRESSION: Successful placement of 19 cm tip to cuff tunneled hemodialysis catheter via the right internal jugular vein with tips terminating within the superior aspect of the right atrium. The catheter is ready for immediate use. Electronically Signed   By: Sandi Mariscal M.D.   On: 06/14/2020 15:57   IR TUNNELED CENTRAL VENOUS CATHETER PLACEMENT  Result Date: 06/14/2020 INDICATION: End-stage renal disease. In need of durable intravenous access for continuation hemodialysis. Patient with history placement of a non tunneled/temporary right jugular approach dialysis catheter, recently removed. EXAM: TUNNELED CENTRAL VENOUS HEMODIALYSIS CATHETER PLACEMENT WITH ULTRASOUND AND FLUOROSCOPIC GUIDANCE MEDICATIONS: Patient is currently admitted to the hospital receiving intravenous antibiotics. The antibiotic was given in an appropriate time interval prior to skin puncture. ANESTHESIA/SEDATION: None FLUOROSCOPY TIME:  18 seconds (4 mGy) COMPLICATIONS: None immediate. PROCEDURE: Informed written consent was obtained from the patient after a discussion of the risks, benefits, and alternatives to treatment. Questions regarding the procedure were encouraged and answered. The right neck and chest were prepped with chlorhexidine in a sterile fashion, and a sterile drape was applied covering  the operative field. Maximum barrier sterile technique with sterile gowns and gloves were used for the procedure. A timeout was performed prior to the initiation of the procedure. After creating a small venotomy incision, a micropuncture kit was utilized to access the internal jugular vein. Note was made of a minimal amount nonocclusive thrombus within the right internal jugular vein, likely the sequela  of recent dialysis catheter placement. Real-time ultrasound guidance was utilized for vascular access including the acquisition of a permanent ultrasound image documenting patency of the accessed vessel. The microwire was utilized to measure appropriate catheter length. A stiff Glidewire was advanced to the level of the IVC and the micropuncture sheath was exchanged for a peel-away sheath. A palindrome tunneled hemodialysis catheter measuring 19 cm from tip to cuff was tunneled in a retrograde fashion from the anterior chest wall to the venotomy incision. The catheter was then placed through the peel-away sheath with tips ultimately positioned within the superior aspect of the right atrium. Final catheter positioning was confirmed and documented with a spot radiographic image. The catheter aspirates and flushes normally. The catheter was flushed with appropriate volume heparin dwells. The catheter exit site was secured with a 0-Prolene retention suture. The venotomy incision was closed with Dermabond and Steri-strips. Dressings were applied. The patient tolerated the procedure well without immediate post procedural complication. IMPRESSION: Successful placement of 19 cm tip to cuff tunneled hemodialysis catheter via the right internal jugular vein with tips terminating within the superior aspect of the right atrium. The catheter is ready for immediate use. Electronically Signed   By: Sandi Mariscal M.D.   On: 06/14/2020 15:57     Medications:   . sodium chloride Stopped (06/15/20 0537)  . feeding supplement  (VITAL AF 1.2 CAL) 60 mL/hr at 06/15/20 0400  . piperacillin-tazobactam (ZOSYN)  IV 12.5 mL/hr at 06/15/20 0600   . ARIPiprazole  5 mg Per Tube Daily  . B-complex with vitamin C  1 tablet Per Tube Daily  . chlorhexidine  15 mL Mouth Rinse BID  . Chlorhexidine Gluconate Cloth  6 each Topical Daily  . Chlorhexidine Gluconate Cloth  6 each Topical Q0600  . feeding supplement (PROSource TF)  45 mL Per Tube Daily  . FLUoxetine  40 mg Per Tube Daily  . heparin injection (subcutaneous)  5,000 Units Subcutaneous Q8H  . insulin aspart  0-9 Units Subcutaneous Q4H  . mouth rinse  15 mL Mouth Rinse q12n4p  . midodrine  10 mg Per Tube TID WC  . pantoprazole sodium  40 mg Per Tube Q24H  . sodium chloride flush  10-40 mL Intracatheter Q12H   docusate, polyethylene glycol, sodium chloride flush  Assessment/ Plan:  1.Acute kidney Injury: Normal creatinine at baseline but fluctuating in history of AKI.  Severe AKI with creatinine of 25 on arrival likely ATN.  No signs of hydronephrosis or glomerular disease.  Mild elevation in CPK possibly contributing.   CRRT stopped on 2/21 after catheter malfunction and tunneled catheter with IR on 2/22 - HD for today 2/23 and then plan for likely HD on 2/25  2. Shock/hypotension. Considering cardiogenic and septic   Antibiotics per primary service. Pressors now off.  midodrine 10 mg TID  3. VDRF - recent mild covid.  Off vent on supp oxygen    4. Unresponsiveness  CT head and drug screen and airway management per CCM.    5. Hypophosphatemia: repleted; resolved; 2/2 CRRT   6. Hyperkalemia   resolved.  For HD  7. Normocytic anemia - secondary in part to CKD. For aranesp once 40 mcg on 2/23 and weekly.  Check Iron panel   Claudia Desanctis, MD 06/15/2020 7:28 AM

## 2020-06-15 NOTE — Progress Notes (Signed)
  Speech Language Pathology Treatment: Dysphagia  Patient Details Name: [name] MRN: [id number] DOB: [birth year] Today's Date: 06/15/2020 Time: 1000-1013 SLP Time Calculation (min) (ACUTE ONLY): 13 min  Assessment / Plan / Recommendation Clinical Impression  Pt was seen for dysphagia treatment with her husband present. Pt's RN, [name], reported that the pt was given ice chips and small sips of water and did not demonstrate coughing. Pt inconsistently exhibited throat clearing with thin liquids via straw and multiple swallows were noted with puree boluses. Pt stated that she had some difficulty "getting it down" with the puree. Pt's dentures were not in place, but pt stated that she does eat soft foods without her dentures and her husband indicated that he can bring them tomorrow. A modified barium swallow study is recommended to further assess swallow function prior to initiation of a p.o. diet. Pt may have ice chips and very small sips of water following oral care. It will be planned for today pending coordination of RN, radiology and HD schedules.    HPI HPI: [name] who apparently fell out of bed during the night and was found to be in asystole, and she was intubated and underwent chest compressions as well as EPI with ROSC.  Pt was admitted for COVID-19 in mid January.  Pt was intubated from 2/16 - 2/20.      SLP Plan  MBS       Recommendations  Diet recommendations: NPO Medication Administration: Via alternative means                Oral Care Recommendations: Oral care QID;Oral care prior to ice chip/H20 Follow up Recommendations: Other (comment) (TBD) SLP Visit Diagnosis: Dysphagia, unspecified (R13.10) Plan: MBS       [name] I. Hardin Negus, [place], [place] Office number [phone] Pager [place] 06/15/2020, 10:20 AM

## 2020-06-15 NOTE — Progress Notes (Signed)
Inpatient Rehabilitation Admissions Coordinator   Came to see patient for rehab assessment. Not available, out of room. I will follow up tomorrow.  Danne Baxter, RN, MSN Rehab Admissions Coordinator [phone] 06/15/2020 2:44 PM

## 2020-06-15 NOTE — Progress Notes (Signed)
Modified Barium Swallow Progress Note  Patient Details  Name: [name] MRN: [id number] Date of Birth: 12/25/1970  Today's Date: 06/15/2020  Modified Barium Swallow completed.  Full report located under Chart Review in the Imaging Section.  Brief recommendations include the following:  Clinical Impression  Pt presents with oropharyngeal dysphagia characterized by decreased bolus cohesion and a pharyngeal delay. She demonstrated premature spillage to the valleculae and pyriform sinuses, penetration (PAS 3) of thin liquids via straw, and aspiration (PAS 7) of thin liquids when consecutive swallows were used.  Aspiration resulted in coughing, but this was ineffective in expelling the aspirated material. Throat clearing was intermittently noted while the fluoro was off and it is anticipated that at least some instances of penetration ultimately resulted in aspiration.The barium tablet became lodged in the valleculae and this appeared to be at least partly due to the [place]. A dysphagia 2 diet with nectar thick liquids will be initiated at this time. Pt has denied a history of dysphagia and it is anticipated that the pt's dysphagia is temporary secondary to prolonged intubation. SLP will follow to assess improvement in swallow function and for diet advancement.   Swallow Evaluation Recommendations       SLP Diet Recommendations: Dysphagia 2 (Fine chop) solids;Nectar thick liquid   Liquid Administration via: Cup;Straw   Medication Administration: Whole meds with puree (or via Cortrak)   Supervision: Staff to assist with self feeding;Intermittent supervision to cue for compensatory strategies   Compensations: Slow rate;Small sips/bites   Postural Changes: Seated upright at 90 degrees   Oral Care Recommendations: Oral care BID      Jennefer Kopp I. Hardin Negus, [place], [place] Office number [phone] Pager [phone]   Horton Marshall 06/15/2020,3:32  PM

## 2020-06-16 ENCOUNTER — Inpatient Hospital Stay (HOSPITAL_COMMUNITY): Payer: Medicare Other

## 2020-06-16 DIAGNOSIS — K921 Melena: Secondary | ICD-10-CM | POA: Diagnosis not present

## 2020-06-16 DIAGNOSIS — R1030 Lower abdominal pain, unspecified: Secondary | ICD-10-CM

## 2020-06-16 DIAGNOSIS — E871 Hypo-osmolality and hyponatremia: Secondary | ICD-10-CM | POA: Diagnosis not present

## 2020-06-16 DIAGNOSIS — D62 Acute posthemorrhagic anemia: Secondary | ICD-10-CM | POA: Diagnosis not present

## 2020-06-16 DIAGNOSIS — R739 Hyperglycemia, unspecified: Secondary | ICD-10-CM | POA: Diagnosis not present

## 2020-06-16 DIAGNOSIS — I469 Cardiac arrest, cause unspecified: Secondary | ICD-10-CM | POA: Diagnosis not present

## 2020-06-16 DIAGNOSIS — F32A Depression, unspecified: Secondary | ICD-10-CM

## 2020-06-16 DIAGNOSIS — J9601 Acute respiratory failure with hypoxia: Secondary | ICD-10-CM | POA: Diagnosis not present

## 2020-06-16 DIAGNOSIS — F419 Anxiety disorder, unspecified: Secondary | ICD-10-CM

## 2020-06-16 DIAGNOSIS — R570 Cardiogenic shock: Secondary | ICD-10-CM

## 2020-06-16 DIAGNOSIS — F1510 Other stimulant abuse, uncomplicated: Secondary | ICD-10-CM

## 2020-06-16 DIAGNOSIS — K922 Gastrointestinal hemorrhage, unspecified: Secondary | ICD-10-CM

## 2020-06-16 LAB — GLUCOSE, CAPILLARY
Glucose-Capillary: 107 mg/dL — ABNORMAL HIGH (ref 70–99)
Glucose-Capillary: 155 mg/dL — ABNORMAL HIGH (ref 70–99)
Glucose-Capillary: 157 mg/dL — ABNORMAL HIGH (ref 70–99)
Glucose-Capillary: 189 mg/dL — ABNORMAL HIGH (ref 70–99)
Glucose-Capillary: 199 mg/dL — ABNORMAL HIGH (ref 70–99)
Glucose-Capillary: 204 mg/dL — ABNORMAL HIGH (ref 70–99)

## 2020-06-16 LAB — HEMOGLOBIN AND HEMATOCRIT, BLOOD
HCT: 21.9 % — ABNORMAL LOW (ref 36.0–46.0)
HCT: 26.3 % — ABNORMAL LOW (ref 36.0–46.0)
Hemoglobin: 7 g/dL — ABNORMAL LOW (ref 12.0–15.0)
Hemoglobin: 8.7 g/dL — ABNORMAL LOW (ref 12.0–15.0)

## 2020-06-16 LAB — COMPREHENSIVE METABOLIC PANEL
ALT: 42 U/L (ref 0–44)
AST: 40 U/L (ref 15–41)
Albumin: 2.3 g/dL — ABNORMAL LOW (ref 3.5–5.0)
Alkaline Phosphatase: 96 U/L (ref 38–126)
Anion gap: 13 (ref 5–15)
BUN: 38 mg/dL — ABNORMAL HIGH (ref 6–20)
CO2: 25 mmol/L (ref 22–32)
Calcium: 8.3 mg/dL — ABNORMAL LOW (ref 8.9–10.3)
Chloride: 95 mmol/L — ABNORMAL LOW (ref 98–111)
Creatinine, Ser: 2.76 mg/dL — ABNORMAL HIGH (ref 0.44–1.00)
GFR, Estimated: 20 mL/min — ABNORMAL LOW (ref >60)
Glucose, Bld: 194 mg/dL — ABNORMAL HIGH (ref 70–99)
Potassium: 4 mmol/L (ref 3.5–5.1)
Sodium: 133 mmol/L — ABNORMAL LOW (ref 135–145)
Total Bilirubin: 0.7 mg/dL (ref 0.3–1.2)
Total Protein: 5.7 g/dL — ABNORMAL LOW (ref 6.5–8.1)

## 2020-06-16 LAB — FOLATE: Folate: 6.5 ng/mL (ref >5.9)

## 2020-06-16 LAB — PHOSPHORUS: Phosphorus: 3.1 mg/dL (ref 2.5–4.6)

## 2020-06-16 LAB — CBC
HCT: 22.5 % — ABNORMAL LOW (ref 36.0–46.0)
Hemoglobin: 7.1 g/dL — ABNORMAL LOW (ref 12.0–15.0)
MCH: 30.9 pg (ref 26.0–34.0)
MCHC: 31.6 g/dL (ref 30.0–36.0)
MCV: 97.8 fL (ref 80.0–100.0)
Platelets: 189 10*3/uL (ref 150–400)
RBC: 2.3 MIL/uL — ABNORMAL LOW (ref 3.87–5.11)
RDW: 13.7 % (ref 11.5–15.5)
WBC: 7.7 10*3/uL (ref 4.0–10.5)
nRBC: 0 % (ref 0.0–0.2)

## 2020-06-16 LAB — LACTIC ACID, PLASMA
Lactic Acid, Venous: 1 mmol/L (ref 0.5–1.9)
Lactic Acid, Venous: 0.9 mmol/L (ref 0.5–1.9)

## 2020-06-16 LAB — RETICULOCYTES
Immature Retic Fract: 34.1 % — ABNORMAL HIGH (ref 2.3–15.9)
RBC.: 2.19 MIL/uL — ABNORMAL LOW (ref 3.87–5.11)
Retic Count, Absolute: 78.6 10*3/uL (ref 19.0–186.0)
Retic Ct Pct: 3.6 % — ABNORMAL HIGH (ref 0.4–3.1)

## 2020-06-16 LAB — IRON AND TIBC
Iron: 62 ug/dL (ref 28–170)
Saturation Ratios: 32 % — ABNORMAL HIGH (ref 10.4–31.8)
TIBC: 193 ug/dL — ABNORMAL LOW (ref 250–450)
UIBC: 131 ug/dL

## 2020-06-16 LAB — PREPARE RBC (CROSSMATCH)

## 2020-06-16 LAB — FERRITIN: Ferritin: 347 ng/mL — ABNORMAL HIGH (ref 11–307)

## 2020-06-16 LAB — ABO/RH: ABO/RH(D): O POS

## 2020-06-16 LAB — VITAMIN B12: Vitamin B-12: 844 pg/mL (ref 180–914)

## 2020-06-16 MED ORDER — SODIUM CHLORIDE 0.9% IV SOLUTION: Freq: Once | INTRAVENOUS | Status: DC

## 2020-06-16 MED ORDER — FUROSEMIDE 10 MG/ML IJ SOLN
80.0000 mg | Freq: Once | INTRAMUSCULAR | Status: AC
  Administered 2020-06-16: 80 mg via INTRAVENOUS
  Filled 2020-06-16: qty 8

## 2020-06-16 MED ORDER — FENTANYL CITRATE (PF) 100 MCG/2ML IJ SOLN
12.5000 ug | INTRAMUSCULAR | Status: DC | PRN
  Administered 2020-06-16 – 2020-06-19 (×2): 12.5 ug via INTRAVENOUS
  Administered 2020-06-23: 25 ug via INTRAVENOUS
  Administered 2020-06-23: 12.5 ug via INTRAVENOUS
  Filled 2020-06-16 (×4): qty 2

## 2020-06-16 MED ORDER — PROSOURCE TF PO LIQD
90.0000 mL | Freq: Two times a day (BID) | ORAL | Status: DC
  Administered 2020-06-16 – 2020-06-20 (×8): 90 mL
  Filled 2020-06-16 (×13): qty 90

## 2020-06-16 MED ORDER — ENSURE ENLIVE PO LIQD
237.0000 mL | Freq: Two times a day (BID) | ORAL | Status: DC
  Administered 2020-06-16 – 2020-06-20 (×4): 237 mL via ORAL

## 2020-06-16 MED ORDER — HYDROCORTISONE ACETATE 25 MG RE SUPP
25.0000 mg | Freq: Two times a day (BID) | RECTAL | Status: AC
  Administered 2020-06-16 – 2020-06-20 (×8): 25 mg via RECTAL
  Filled 2020-06-16 (×14): qty 1

## 2020-06-16 MED ORDER — RESOURCE THICKENUP CLEAR PO POWD
ORAL | Status: DC | PRN
  Filled 2020-06-16: qty 125

## 2020-06-16 MED ORDER — DARBEPOETIN ALFA 40 MCG/0.4ML IJ SOSY
40.0000 ug | PREFILLED_SYRINGE | INTRAMUSCULAR | Status: DC
  Administered 2020-06-16: 40 ug via SUBCUTANEOUS
  Filled 2020-06-16 (×2): qty 0.4

## 2020-06-16 MED ORDER — VITAL 1.5 CAL PO LIQD
720.0000 mL | ORAL | Status: DC
  Administered 2020-06-16 – 2020-06-21 (×6): 720 mL
  Filled 2020-06-16 (×7): qty 948

## 2020-06-16 MED ORDER — RENA-VITE PO TABS
1.0000 | ORAL_TABLET | Freq: Every day | ORAL | Status: DC
  Administered 2020-06-16: 1 via ORAL
  Filled 2020-06-16: qty 1

## 2020-06-16 MED ORDER — DICYCLOMINE HCL 10 MG PO CAPS
10.0000 mg | ORAL_CAPSULE | Freq: Three times a day (TID) | ORAL | Status: DC
  Administered 2020-06-16 – 2020-06-17 (×2): 10 mg via ORAL
  Filled 2020-06-16 (×4): qty 1

## 2020-06-16 MED ORDER — ACETAMINOPHEN 325 MG PO TABS
650.0000 mg | ORAL_TABLET | Freq: Four times a day (QID) | ORAL | Status: DC | PRN
  Administered 2020-06-16 – 2020-06-21 (×2): 650 mg via ORAL
  Filled 2020-06-16 (×2): qty 2

## 2020-06-16 MED ORDER — CHLORHEXIDINE GLUCONATE CLOTH 2 % EX PADS
6.0000 | MEDICATED_PAD | Freq: Every day | CUTANEOUS | Status: DC
  Administered 2020-06-18 – 2020-06-19 (×2): 6 via TOPICAL

## 2020-06-16 NOTE — Consult Note (Addendum)
Attending physician's note   I have taken an interval history, reviewed the chart and examined the patient. I agree with the Advanced Practitioner's note, impression, and recommendations as outlined.   50 year old female with medical history as outlined below admitted 06/08/2020 with cardiac arrest.  EMS achieved ROSC and admitted to ICU, started on CRRT and transitioned to HD on 2/23.  Extubated on 2/20.  GI service consulted today for evaluation of lower abdominal pain, BRBPR, and acute blood loss anemia.  Extensive GI work-up in 6387 for IDA and cyclic vomiting to include EGD (Barrett's Esophagus, non-H. pylori gastritis), colonoscopy (TA x2, benign rectal erythema), and VCE (1 small nonbleeding distal AVM).  H/H 10.6/33.2 on admission.  Was 10.8/34 on hospital discharge on 05/12/2020 (for Covid).  Hgb has since drifted down to 7.1 today.  Staff noted BRB on otherwise brown stool today.  No melena.  Does have lower abdominal cramping type pain.  Unclear if this is similar to her prior abdominal cramping for which she has been seen in the GI clinic.  KUB today otherwise unremarkable.  Exam today with visible hemorrhoids and pain on rectal exam.  1) Hematochezia 2) Acute blood loss anemia 3) Abdominal pain/cramping 4) Cardiac arrest -As she is otherwise hemodynamically stable and only small volume blood loss, given recent cardiac arrest, plan for more conservative management -Topical therapy for hemorrhoids noted on exam -Aranesp per nephrology -Continue trending H/H with blood products per ICU protocol -If large volume bleed, can consider tagged RBC scan to try to localize -If continued bleeding or other concerns, could entertain colonoscopy +/-push enteroscopy, but again, would be high perioperative risk -Can add Bentyl scheduled for abdominal cramping and can hopefully transition to prn use in the next couple of days  5) Dysphagia 2/2 recent intubation -No pre-existing history of  dysphagia -Dysphagia 2 diet with nectar thick per Speech Pathology  6) Acute renal failure -HD and electrolyte management per Nephrology service  GI service will continue to follow  Gerrit Heck, DO, FACG [phone] office                                                                                                                                                                               [facility] Consult: 1:27 PM 06/16/2020  LOS: 8 days    Referring Provider: Dr. Cyndia Skeeters.   Primary Care Physician:  Andreas Blower, MD Primary Gastroenterologist:  Dr. Thornton Park.   Reason for Consultation: Anemia, decline of Hgb.  Minor rectal bleeding.   HPI: [name] is a 50 y.o. female.  PMH anxiety/depression.  Arthritis.  Obesity. Endometriosis.  Seizures.  Suicide attempt 2016 with Zanaflex overdose. Chronic pain.  Hemorrhoidectomy 1999.  GI work-ups for cyclic vomiting and for IDA in 2020. 12/2018 EGD.  Normal esophagus, biopsied.  Small HH, biopsied, normal duodenum biopsied. Pathology showed mild reactive gastropathy, mild chronic gastritis, no H. pylori.  Duodenal biopsies benign.  Esophageal biopsies showed Barrett's esophagus without dysplasia. 02/2019 colonoscopy: 2 small sessile polyps removed from the IC valve and ascending colon.  Rectal inflammation with mild erythema and granularity, biopsied. Path of polyps: tubular adenomas without HGD.  Random colon and rectal biopsies were benign without evidence for inflammation or microscopic colitis. 12/2018 VCE: Tiny, nonbleeding AVM at 6 hours, 36 minutes.  Adequate prep, complete study. Amitriptyline added for cyclic vomiting.  Tested positive for COVID-19 05/10/2020, admitted x 2 d w mild illness.  C/o abdominal pain and CTAP with contrast showed hepatic steatosis, stable left [place] diaphragmatic hernia containing gastric fundus but nothing acute  Admitted a week ago following cardiac  arrest/asystole, 3 minutes CPR/epinephrine with successful ROSC.  Treated in ICU for shock, sepsis, respiratory failure, aspiration pneumonia, acidosis, rhabdomyolysis, AKI, hypothermia, hypokalemia, hyponatremia, encephalopathy. Intubated 2/16-2/20.  Started on CRRT with transition to scheduled dialysis on 2/23.  She is anuric.  Received Zosyn 2/16-2/22.  UDS positive for amphetamines (not RXd). Notes mention she has essentially been in bed since 02/2020 and had not been on on any psych meds.  Now started on Abilify and Prozac w recommendation for outpatient substance abuse treatment following inpt physical/medical rehab.  Lamictal, risperidone, Zanaflex, trazodone and Xanax are on hold. Speech-language pathology swallow study 2/23 with mild to moderate oropharyngeal dysphagia.  Recommendation is for dysphagia 2 diet with thickened liquids for her moderate aspiration risk.  However she remains on core track tube feedings.  Hgb 10.6 >> 11.6, then drifting steadily down to 7.1 >> 7 today. Hgb in mid January ranged from 14.7 on admission >> 10.8 at discharge. MCV consistently in the mid to upper 90s.  At 1 point platelets dropped to 103 but they have recovered to 189. INR on admission was 1.8, not rechecked. AST/ALT as high as 172/41, now normal.  T bili and alkaline phosphatase were never elevated. For complaint of abdominal pain had portable KUB today.  This shows the feeding tube at the distal stomach, contrast in the stomach contrast in the distal bowel.  No dilatation of bowel, no air-fluid levels.  No free air.  Bases of lungs are clear.  Scant amount of blood in brown stools noted for a couple of days.  Intermittent vague abdominal discomfort, not severe pain.  No nausea.  No reports of constipation or hard stool.  Pt herself has memory deficits and cannot even remember when she last had a stool but does not recall blood in her stool at home.  There are visible hemorrhoids in her rectum and bleeding  was attributed to these.  Getting Heparin through IJ at time of HD for last 7 days.     Married, lives with her husband.  Family history of colon polyps in her grandmother but no hx colon cancer.   Past Medical History:  Diagnosis Date  . Anxiety   . Arthritis    "joints ache all over" (10/15/2014)  . Bulging lumbar disc   . Chronic lower back pain   . DDD (degenerative disc disease), cervical   . Depression   . Drug-seeking behavior   . Headache    "weekly" (10/15/2014)  . Hyperlipemia   . Hypertension   . PTSD (post-traumatic stress disorder)   . Skin cancer    "  had them cut off my arms; don't know what kind"    Past Surgical History:  Procedure Laterality Date  . ABLATION ON ENDOMETRIOSIS  2008  . BIOPSY  12/27/2018   Procedure: BIOPSY;  Surgeon: Thornton Park, MD;  Location: [facility];  Service: Gastroenterology;;  . ESOPHAGOGASTRODUODENOSCOPY (EGD) WITH PROPOFOL N/A 12/27/2018   Procedure: ESOPHAGOGASTRODUODENOSCOPY (EGD) WITH PROPOFOL;  Surgeon: Thornton Park, MD;  Location: [facility];  Service: Gastroenterology;  Laterality: N/A;  . HEMORRHOID SURGERY  ~ 2002  . IR FLUORO GUIDE CV LINE RIGHT  06/14/2020  . IR US GUIDE VASC ACCESS RIGHT  06/14/2020  . ORIF ANKLE FRACTURE Right 03/28/2020   Procedure: OPEN REDUCTION INTERNAL FIXATION (ORIF) RIGHT BIMALLEOLAR ANKLE FRACTURE;  Surgeon: Marchia Bond, MD;  Location: [facility];  Service: Orthopedics;  Laterality: Right;    Prior to Admission medications   Medication Sig Start Date End Date Taking? Authorizing Provider  ALPRAZOLAM PO Take 1 tablet by mouth daily.   Yes [provider]  ferrous sulfate 325 (65 FE) MG tablet Take 325 mg by mouth daily with breakfast.   Yes [provider]  FLUoxetine (PROZAC) 40 MG capsule Take 40 mg by mouth daily.  06/26/17  Yes [provider]  hydrochlorothiazide (HYDRODIURIL) 25 MG tablet Take 1 tablet (25 mg total) by mouth at bedtime.  05/14/20  Yes Thurnell Lose, MD  lamoTRIgine (LAMICTAL) 200 MG tablet Take 100-200 mg by mouth See admin instructions. 245m in the morning and 1059min the evening 04/29/20  Yes [provider]  lisinopril (ZESTRIL) 10 MG tablet Take 1 tablet (10 mg total) by mouth daily. 05/15/20  Yes SiThurnell LoseMD  omeprazole (PRILOSEC) 20 MG capsule Take 1 capsule (20 mg total) by mouth daily. 05/12/20  Yes SiThurnell LoseMD  risperiDONE (RISPERDAL) 0.5 MG tablet Take 0.5 mg by mouth 2 (two) times daily. 03/03/18  Yes [provider]  tiZANidine (ZANAFLEX) 4 MG tablet Take 12 mg by mouth 3 (three) times daily.   Yes [provider]  traZODone (DESYREL) 150 MG tablet Take 150 mg by mouth at bedtime.  06/03/17  Yes [provider]    Scheduled Meds: . ARIPiprazole  5 mg Per Tube Daily  . chlorhexidine  15 mL Mouth Rinse BID  . Chlorhexidine Gluconate Cloth  6 each Topical Daily  . Chlorhexidine Gluconate Cloth  6 each Topical Q0600  . darbepoetin (ARANESP) injection - NON-DIALYSIS  40 mcg Subcutaneous Q Thu-1800  . feeding supplement  237 mL Oral BID BM  . feeding supplement (PROSource TF)  90 mL Per Tube BID  . feeding supplement (VITAL 1.5 CAL)  720 mL Per Tube Q24H  . FLUoxetine  40 mg Per Tube Daily  . insulin aspart  0-9 Units Subcutaneous Q4H  . mouth rinse  15 mL Mouth Rinse q12n4p  . midodrine  10 mg Per Tube TID WC  . multivitamin  1 tablet Oral QHS  . pantoprazole sodium  40 mg Per Tube Q24H  . sodium chloride flush  10-40 mL Intracatheter Q12H   Infusions: . sodium chloride Stopped (06/15/20 1409)   PRN Meds: acetaminophen, docusate, fentaNYL (SUBLIMAZE) injection, polyethylene glycol, Resource ThickenUp Clear, sodium chloride flush   Allergies as of 06/08/2020 - Review Complete 06/08/2020  Allergen Reaction Noted  . Lactose intolerance (gi) Nausea Only 12/11/2014    Family History  Problem Relation Age of Onset  . Breast cancer Mother    . Diabetes Mother   .  Breast cancer Maternal Grandmother   . Breast cancer Paternal Grandmother   . Colon polyps Paternal Grandmother   . Colon cancer Neg Hx   . Esophageal cancer Neg Hx   . Rectal cancer Neg Hx   . Stomach cancer Neg Hx     Social History   Socioeconomic History  . Marital status: Married    Spouse name: Not on file  . Number of children: Not on file  . Years of education: Not on file  . Highest education level: Not on file  Occupational History  . Not on file  Tobacco Use  . Smoking status: Never Smoker  . Smokeless tobacco: Never Used  Vaping Use  . Vaping Use: Never used  Substance and Sexual Activity  . Alcohol use: Not Currently    Comment: 10/15/2014 "I've drank before; nothing regular; don't drink now cause of RX I'm on"  . Drug use: No  . Sexual activity: Not Currently  Other Topics Concern  . Not on file  Social History Narrative   [place]. Never smoked; quit alcohol 10 years; used to work for [employer]. NO IV drug abuse.    Social Determinants of Health   Financial Resource Strain: Not on file  Food Insecurity: Not on file  Transportation Needs: Not on file  Physical Activity: Not on file  Stress: Not on file  Social Connections: Not on file  Intimate Partner Violence: Not on file    REVIEW OF SYSTEMS: Constitutional: Denies feeling tired. ENT:  No nose bleeds.  No remaining teeth Pulm: Denies shortness of breath. CV:  No palpitations, no LE edema.  Denies chest pain GU:  No hematuria, no frequency GI: See HPI.  Denies difficulty swallowing at home. Heme: No mention of unusual bleeding or bruising other than minor rectal bleeding Transfusions: No previous transfusion of blood products. Neuro:  No headaches, no peripheral tingling or numbness.  No seizures.   Derm:  No itching, no rash or sores.  Endocrine:  No sweats or chills.  No polyuria or dysuria Immunization: No records of  previous vaccination exist in epic. Travel: Unknown   PHYSICAL EXAM: Vital signs in last 24 hours: Vitals:   06/16/20 1000 06/16/20 1200  BP: 120/67 110/68  Pulse: 96 95  Resp: 15 20  Temp:  99.1 F (37.3 C)  SpO2: 95% 94%   Wt Readings from Last 3 Encounters:  06/16/20 90.4 kg  05/11/20 94 kg  03/28/20 96.3 kg    General: Alert, comfortable, looks a bit pale but not unwell or ill Head: No facial asymmetry or swelling.  No signs of head trauma. Eyes: No conjunctival pallor.  No scleral icterus. Ears: No hearing deficit. Nose: No congestion or discharge.  Core track feeding tube in place. Mouth: Edentulous.  Mucosa is moist, pink, clear.  Tongue is midline. Neck: No JVD, no masses, no thyromegaly Lungs: No labored breathing or cough.  Lungs clear bilaterally. Heart: RRR.  No MRG.  S1, S2 present Abdomen: Obese, nontender, soft.  Bowel sounds present but somewhat hypoactive.  No tinkling or high-pitched bowel sounds.  No HSM, masses, bruits, hernias.  Moderate, baseball sized area of bruising in the lower abdomen.   Rectal: Visible external, nonbleeding, nonthrombosed hemorrhoids.  Pain on attempted digital exam so exam aborted.  No blood on exam glove.  No visible fissures. Musc/Skeltl: No joint redness, swelling or gross deformity. Extremities: No CCE. Neurologic: Alert.  Oriented to self but not  oriented to place, time.  Recall when she last had a bowel movement. Skin: No rash, no sores, no lesions Tattoos: None observed Nodes: Cervical adenopathy Psych: Flat affect, cooperative.  Follows commands.  Intake/Output from previous day: 02/23 0701 - 02/24 0700 In: 1768 [P.O.:150; I.V.:65.1; NG/GT:1520; IV Piggyback:32.9] Out: 0  Intake/Output this shift: Total I/O In: 430 [NG/GT:430] Out: -   LAB RESULTS: Recent Labs    06/15/20 0414 06/16/20 0423  WBC 9.1 7.7  HGB 7.4* 7.1*  HCT 22.8* 22.5*  PLT 152 189   BMET Lab Results  Component Value Date   NA 133 (L)  06/16/2020   NA 136 06/15/2020   NA 137 06/14/2020   K 4.0 06/16/2020   K 5.1 06/15/2020   K 4.2 06/14/2020   CL 95 (L) 06/16/2020   CL 96 (L) 06/15/2020   CL 98 06/14/2020   CO2 25 06/16/2020   CO2 22 06/15/2020   CO2 24 06/14/2020   GLUCOSE 194 (H) 06/16/2020   GLUCOSE 205 (H) 06/15/2020   GLUCOSE 189 (H) 06/14/2020   BUN 38 (H) 06/16/2020   BUN 90 (H) 06/15/2020   BUN 58 (H) 06/14/2020   CREATININE 2.76 (H) 06/16/2020   CREATININE 4.93 (H) 06/15/2020   CREATININE 3.34 (H) 06/14/2020   CALCIUM 8.3 (L) 06/16/2020   CALCIUM 8.2 (L) 06/15/2020   CALCIUM 7.8 (L) 06/14/2020   LFT Recent Labs    06/14/20 0423 06/15/20 0414 06/16/20 0423  PROT  --   --  5.7*  ALBUMIN 2.0* 1.9* 2.3*  AST  --   --  40  ALT  --   --  42  ALKPHOS  --   --  96  BILITOT  --   --  0.7   PT/INR Lab Results  Component Value Date   INR 1.8 (H) 06/08/2020   INR 1.30 10/08/2015   INR 1.14 12/12/2014   Hepatitis Panel Recent Labs    06/15/20 1649  HEPBSAG NON REACTIVE   C-Diff No components found for: CDIFF Lipase     Component Value Date/Time   LIPASE 21 05/10/2020 0434    Drugs of Abuse     Component Value Date/Time   LABOPIA NONE DETECTED 06/08/2020 1512   COCAINSCRNUR NONE DETECTED 06/08/2020 1512   COCAINSCRNUR NONE DETECTED 11/28/2018 0325   LABBENZ NONE DETECTED 06/08/2020 1512   AMPHETMU POSITIVE (A) 06/08/2020 1512   THCU NONE DETECTED 06/08/2020 1512   LABBARB NONE DETECTED 06/08/2020 1512     RADIOLOGY STUDIES: IR US Guide Vasc Access Right  Result Date: 06/14/2020 INDICATION: End-stage renal disease. In need of durable intravenous access for continuation hemodialysis. Patient with history placement of a non tunneled/temporary right jugular approach dialysis catheter, recently removed. EXAM: TUNNELED CENTRAL VENOUS HEMODIALYSIS CATHETER PLACEMENT WITH ULTRASOUND AND FLUOROSCOPIC GUIDANCE MEDICATIONS: Patient is currently admitted to the hospital receiving intravenous  antibiotics. The antibiotic was given in an appropriate time interval prior to skin puncture. ANESTHESIA/SEDATION: None FLUOROSCOPY TIME:  18 seconds (4 mGy) COMPLICATIONS: None immediate. PROCEDURE: Informed written consent was obtained from the patient after a discussion of the risks, benefits, and alternatives to treatment. Questions regarding the procedure were encouraged and answered. The right neck and chest were prepped with chlorhexidine in a sterile fashion, and a sterile drape was applied covering the operative field. Maximum barrier sterile technique with sterile gowns and gloves were used for the procedure. A timeout was performed prior to the initiation of the procedure. After creating a small venotomy incision,  a micropuncture kit was utilized to access the internal jugular vein. Note was made of a minimal amount nonocclusive thrombus within the right internal jugular vein, likely the sequela of recent dialysis catheter placement. Real-time ultrasound guidance was utilized for vascular access including the acquisition of a permanent ultrasound image documenting patency of the accessed vessel. The microwire was utilized to measure appropriate catheter length. A stiff Glidewire was advanced to the level of the IVC and the micropuncture sheath was exchanged for a peel-away sheath. A palindrome tunneled hemodialysis catheter measuring 19 cm from tip to cuff was tunneled in a retrograde fashion from the anterior chest wall to the venotomy incision. The catheter was then placed through the peel-away sheath with tips ultimately positioned within the superior aspect of the right atrium. Final catheter positioning was confirmed and documented with a spot radiographic image. The catheter aspirates and flushes normally. The catheter was flushed with appropriate volume heparin dwells. The catheter exit site was secured with a 0-Prolene retention suture. The venotomy incision was closed with Dermabond and  Steri-strips. Dressings were applied. The patient tolerated the procedure well without immediate post procedural complication. IMPRESSION: Successful placement of 19 cm tip to cuff tunneled hemodialysis catheter via the right internal jugular vein with tips terminating within the superior aspect of the right atrium. The catheter is ready for immediate use. Electronically Signed   By: Sandi Mariscal M.D.   On: 06/14/2020 15:57   DG Swallowing Func-Speech Pathology, MBS swallo eval  Result Date: 06/15/2020 Clinical Impression Pt presents with oropharyngeal dysphagia characterized by decreased bolus cohesion and a pharyngeal delay. She demonstrated premature spillage to the valleculae and pyriform sinuses, penetration (PAS 3) of thin liquids via straw, and aspiration (PAS 7) of thin liquids when consecutive swallows were used.  Aspiration resulted in coughing, but this was ineffective in expelling the aspirated material. Throat clearing was intermittently noted while the fluoro was off and it is anticipated that at least some instances of penetration ultimately resulted in aspiration.The barium tablet became lodged in the valleculae and this appeared to be at least partly due to the [place]. A dysphagia 2 diet with nectar thick liquids will be initiated at this time. Pt has denied a history of dysphagia and it is anticipated that the pt's dysphagia is temporary secondary to prolonged intubation. SLP will follow to assess improvement in swallow function and for diet advancement. SLP Visit Diagnosis Dysphagia, oropharyngeal phase (R13.12) Attention and concentration deficit following -- Frontal lobe and executive function deficit following -- Impact on safety and function Mild aspiration risk.    Shanika I. Hardin Negus, [place], [place] Office number [phone] Pager [phone]                IMPRESSION:   *   Minor rectal bleeding.  Had erythema of rectum with benign biopsies at  colonoscopy 02/2019, suspect this may be source of blood but not of anemia.  Also has visible, nonthrombosed hemorrhoids on DRE.  ? Ischemic colitis  *    Anemia.  Acute on chronic.  4 to 5 g drop in hgb over the course of 8 days Had EGD/colonoscopy/VCE for evaluation of unexplained anemia and cyclic vomiting between September and November 2020.  Findings included H. pylori negative mild gastritis, Barrett's esophagus, small tubular adenomatous polyps and benign rectal erythema in colon. She has a moderate sized hematoma on her abdomen but no other visible or palpable hematomas on her trunk or limbs. On aranesp.  *  Cardiac arrest/asystole at home.  Treated with 3 minutes CPR/epinephrine.  Admitted to ICU with shock, sepsis, respiratory failure, aspiration pneumonia, acidosis, AKI, hypothermia, hypokalemia, hyponatremia, encephalopathy. On vent 2/16-2/28.  AKI with anuria addressed with CRRT, began HD on 2/23.  *   Dysphagia oropharyngeal following intubation.  D2/ thickened nectar liquids diet approved but is not eating much so continues on core track feeding.  SLP expects full recovery of swallowing.  *    Depression, anxiety.  Suboptimally treated at home PTA (?  If she was even taking her psych meds?).  Now on new mood stabilizing meds.    PLAN:     *   Per Dr Bryan Lemma.      *   Adding Anusol HC suppositories bid.     Azucena Freed  06/16/2020, 1:27 PM Phone [phone]

## 2020-06-16 NOTE — Progress Notes (Signed)
Physical Therapy Treatment Patient Details Name: [name] MRN: [id number] DOB: 1970/08/10 Today's Date: 06/16/2020    History of Present Illness 50 yo 2/16 after fell out of bed and family checked on her later and found unresponsive s/p arrest and CPR with intubation. ?overdose suspected. CRRT 2/16-2/20. Extubated 2/20. PMhx: HTN, HLD, anxiety/depression, PTSD, Covid + 1/18, Rt ankle fx s/p ORIF, endometriosis    PT Comments    Pt pleasant with decreased orientation, memory and awareness but able to progress to standing and limited gait this session.  Pt limited by fatigue, cognition and safety. Pt reports family may be able to arrange assist at home and she would benefit from CIR prior to return home due to overall fatigue and deconditioning.  BP supine 123/71 (82), sitting 120/69 (83), after gait128/71 (87) HR 96-113 SpO2 90-96% on RA   Follow Up Recommendations  CIR;Supervision/Assistance - 24 hour     Equipment Recommendations  None recommended by PT    Recommendations for Other Services       Precautions / Restrictions Precautions Precautions: Fall;Other (comment) Precaution Comments: cortrak    Mobility  Bed Mobility Overal bed mobility: Needs Assistance Bed Mobility: Supine to Sit     Supine to sit: HOB elevated;Min assist     General bed mobility comments: HOB 30 degrees with cues to initiate and sequence with min assist to pivot legs to EOB, guarding for safety    Transfers Overall transfer level: Needs assistance   Transfers: Sit to/from Stand;Stand Pivot Transfers Sit to Stand: Min guard Stand pivot transfers: Min guard       General transfer comment: RW present to stand from bed x 2 and BSC with pivot bed to [facility] with use of RW. Assist for pericare in standing  Ambulation/Gait Ambulation/Gait assistance: Min assist;+2 safety/equipment Gait Distance (Feet): 40 Feet Assistive device: Rolling walker (2 wheeled) Gait Pattern/deviations:  Step-through pattern;Decreased stride length;Trunk flexed;Wide base of support   Gait velocity interpretation: 1.31 - 2.62 ft/sec, indicative of limited community ambulator General Gait Details: cues for posture, proximity to RW and fatigue. chair to follow with pt limited by fatigue. SPo2 dropped to 90% on RA with gait and HR 116   Stairs             Wheelchair Mobility    Modified Rankin (Stroke Patients Only)       Balance Overall balance assessment: Needs assistance Sitting-balance support: No upper extremity supported;Feet supported Sitting balance-Leahy Scale: Fair Sitting balance - Comments: EOB without UE support   Standing balance support: Bilateral upper extremity supported Standing balance-Leahy Scale: Poor Standing balance comment: bil UE support with RW                            Cognition Arousal/Alertness: Awake/alert Behavior During Therapy: Flat affect Overall Cognitive Status: Impaired/Different from baseline Area of Impairment: Memory;Following commands;Safety/judgement;Problem solving;Orientation;Awareness                 Orientation Level: Disoriented to;Time;Situation   Memory: Decreased short-term memory;Decreased recall of precautions Following Commands: Follows one step commands with increased time Safety/Judgement: Decreased awareness of safety;Decreased awareness of deficits   Problem Solving: Slow processing;Decreased initiation;Requires verbal cues General Comments: pt unaware of month or situation, slow processing and decreased awareness      Exercises General Exercises - Lower Extremity Long Arc Quad: AROM;Both;Seated;15 reps Hip Flexion/Marching: AROM;Both;Seated;15 reps    General Comments  Pertinent Vitals/Pain Pain Score: 5  Pain Location: chest soreness from compressions Pain Descriptors / Indicators: Aching;Discomfort;Sore Pain Intervention(s): Limited activity within patient's  tolerance;Monitored during session;Repositioned    Home Living                      Prior Function            PT Goals (current goals can now be found in the care plan section) Progress towards PT goals: Progressing toward goals    Frequency    Min 3X/week      PT Plan Discharge plan needs to be updated    Co-evaluation              AM-PAC PT "6 Clicks" Mobility   Outcome Measure  Help needed turning from your back to your side while in a flat bed without using bedrails?: A Little Help needed moving from lying on your back to sitting on the side of a flat bed without using bedrails?: A Little Help needed moving to and from a bed to a chair (including a wheelchair)?: A Little Help needed standing up from a chair using your arms (e.g., wheelchair or bedside chair)?: A Little Help needed to walk in hospital room?: A Lot Help needed climbing 3-5 steps with a railing? : A Lot 6 Click Score: 16    End of Session Equipment Utilized During Treatment: Gait belt Activity Tolerance: Patient tolerated treatment well Patient left: in chair;with call bell/phone within reach;with chair alarm set (alarm pad under pt, no box in room, RN aware) Nurse Communication: Mobility status;Precautions PT Visit Diagnosis: Other abnormalities of gait and mobility (R26.89);Difficulty in walking, not elsewhere classified (R26.2);Muscle weakness (generalized) (M62.81)     Time: 9038-3338 PT Time Calculation (min) (ACUTE ONLY): 37 min  Charges:  $Gait Training: 8-22 mins $Therapeutic Exercise: 8-22 mins                     Aurthur Wingerter P, PT Acute Rehabilitation Services Pager: [phone] Office: [place] 06/16/2020, 9:29 AM

## 2020-06-16 NOTE — Progress Notes (Signed)
[name] ROUNDING NOTE   Subjective:   no uop charted.  Had first HD on 2/23 with no UF - was kept even.  Still in ICU.  BP 123/71 on my exam. Has been on 2.5 liters oxygen overnight - room air during day.  Rn states bladder scan yesterday ok.   Review of systems:  She states a little shortness of breath  States hasn't urinated  Denies chest pain  Denies n/v or diarrhea   ------------------ Brief History: 50 y.o. female. With a significant history of hypertension, endometriosis and depression that was seen in the IR 05/10/20 for abdominal pain and was [name] to have Covid 19 infection   She was mildly symptomatic and was discharged home on 05/12/20. She apparently fell out of bed during the night and was [name] to be in asystole and she was intubated and underwent chest compressions as well as EPI with ROSC. Thought that cardiac arrest was related to opioid overdose. AKI now requiring CRRT starting 2/16 likely 2/2 ATN.   In the ER, she was seen by Dr [name], Cardiology and 2 D Echo performed   She had a preserved ejection fraction and no wall motion abnormality. Her blood pressure was 58/33  Pulse was 87 and she was hypothermic at 92 degrees.   Objective:  Vital signs in last 24 hours:  Temp:  [96.9 F (36.1 C)-98.7 F (37.1 C)] 97.8 F (36.6 C) (02/24 0358) Pulse Rate:  [79-110] 79 (02/24 0700) Resp:  [11-22] 22 (02/24 0700) BP: (76-123)/(47-102) 111/56 (02/24 0700) SpO2:  [93 %-99 %] 97 % (02/24 0700) Weight:  [89 kg-90.4 kg] 90.4 kg (02/24 0415)  Weight change: -3.5 kg Filed Weights   06/15/20 1555 06/15/20 1909 06/16/20 0415  Weight: 89 kg 89 kg 90.4 kg    Intake/Output: I/O last 3 completed shifts: In: 1958 [P.O.:150; I.V.:145.4; NG/GT:1580; IV Piggyback:82.6] Out: 0    Intake/Output this shift:  No intake/output data recorded.  General adult female in bed in no acute distress  HEENT normocephalic atraumatic extraocular movements intact sclera  anicteric Neck supple trachea midline Lungs clear to auscultation bilaterally normal work of breathing at rest on nasal cannula 2.5 liters Heart S1S2 no rub Abdomen soft nontender nondistended; obese habitus Extremities no edema  Neuro - more awake and interactive today Psych no anxiety or agitation   Basic Metabolic Panel: Recent Labs  Lab 06/11/20 1630 06/12/20 0258 06/12/20 1714 06/13/20 0328 06/13/20 1506 06/14/20 0423 06/15/20 0414 06/16/20 0423  NA 136 134*   < > 136 136 137 136 133*  K 4.1 4.0   < > 4.0 4.3 4.2 5.1 4.0  CL 102 101   < > 101 100 98 96* 95*  CO2 23 23   < > 26 23 24 22 25   GLUCOSE 166* 232*   < > 186* 232* 189* 205* 194*  BUN 10 13   < > 20 36* 58* 90* 38*  CREATININE 1.48* 1.48*   < > 1.38* 2.29* 3.34* 4.93* 2.76*  CALCIUM 7.8* 7.6*   < > 7.7* 7.7* 7.8* 8.2* 8.3*  MG 2.4 2.2  --  2.1  --  2.1 2.2  --   PHOS 1.0* 1.5*   < > 1.8* 3.7 4.1 4.4 3.1   < > = values in this interval not displayed.    Liver Function Tests: Recent Labs  Lab 06/13/20 0328 06/13/20 1506 06/14/20 0423 06/15/20 0414 06/16/20 0423  AST  --   --   --   --  40  ALT  --   --   --   --  42  ALKPHOS  --   --   --   --  96  BILITOT  --   --   --   --  0.7  PROT  --   --   --   --  5.7*  ALBUMIN 2.1* 2.2* 2.0* 1.9* 2.3*   CBC: Recent Labs  Lab 06/11/20 0306 06/12/20 0258 06/13/20 0328 06/15/20 0414 06/16/20 0423  WBC 13.5* 10.1 8.8 9.1 7.7  HGB 9.8* 9.3* 9.5* 7.4* 7.1*  HCT 30.1* 29.0* 30.3* 22.8* 22.5*  MCV 94.4 94.8 97.7 96.6 97.8  PLT 138* 112* 103* 152 189    Microbiology: Results for orders placed or performed during the hospital encounter of 06/08/20  MRSA PCR Screening     Status: Abnormal   Collection Time: 06/08/20  8:44 PM   Specimen: Nasopharyngeal  Result Value Ref Range Status   MRSA by PCR POSITIVE (A) NEGATIVE Final    Comment:        The GeneXpert MRSA Assay (FDA approved for NASAL specimens only), is one component of a comprehensive MRSA  colonization surveillance program. It is not intended to diagnose MRSA infection nor to guide or monitor treatment for MRSA infections. RESULT CALLED TO, READ BACK BY AND VERIFIED WITH: Ferrel Logan 06/08/20 2359 JDW Performed at [facility], [address] [place], [place] [address]   Blood culture (routine x 2)     Status: None   Collection Time: 06/08/20  9:25 PM   Specimen: BLOOD  Result Value Ref Range Status   Specimen Description BLOOD HEMODIALYSIS CATHETER  Final   Special Requests   Final    BOTTLES DRAWN AEROBIC AND ANAEROBIC Blood Culture results may not be optimal due to an excessive volume of blood received in culture bottles   Culture   Final    NO GROWTH 5 DAYS Performed at [facility], [place] [address], [place], [place] [address]    Report Status 06/13/2020 FINAL  Final  Blood culture (routine x 2)     Status: None   Collection Time: 06/08/20  9:25 PM   Specimen: BLOOD LEFT WRIST  Result Value Ref Range Status   Specimen Description BLOOD LEFT WRIST  Final   Special Requests   Final    BOTTLES DRAWN AEROBIC ONLY Blood Culture adequate volume   Culture   Final    NO GROWTH 5 DAYS Performed at [facility], [place] [address] [place], [place] [address]    Report Status 06/14/2020 FINAL  Final    Imaging: IR US Guide Vasc Access Right  Result Date: 06/14/2020 INDICATION: End-stage renal disease. In need of durable intravenous access for continuation hemodialysis. Patient with history placement of a non tunneled/temporary right jugular approach dialysis catheter, recently removed. EXAM: TUNNELED CENTRAL VENOUS HEMODIALYSIS CATHETER PLACEMENT WITH ULTRASOUND AND FLUOROSCOPIC GUIDANCE MEDICATIONS: Patient is currently admitted to the hospital receiving intravenous antibiotics. The antibiotic was given in an appropriate time interval prior to skin puncture. ANESTHESIA/SEDATION: None FLUOROSCOPY TIME:  18 seconds (4 mGy) COMPLICATIONS: None immediate.  PROCEDURE: Informed written consent was obtained from the patient after a discussion of the risks, benefits, and alternatives to treatment. Questions regarding the procedure were encouraged and answered. The right neck and chest were prepped with chlorhexidine in a sterile fashion, and a sterile drape was applied covering the operative field. Maximum barrier sterile technique with sterile gowns  and gloves were used for the procedure. A timeout was performed prior to the initiation of the procedure. After creating a small venotomy incision, a micropuncture kit was utilized to access the internal jugular vein. Note was made of a minimal amount nonocclusive thrombus within the right internal jugular vein, likely the sequela of recent dialysis catheter placement. Real-time ultrasound guidance was utilized for vascular access including the acquisition of a permanent ultrasound image documenting patency of the accessed vessel. The microwire was utilized to measure appropriate catheter length. A stiff Glidewire was advanced to the level of the IVC and the micropuncture sheath was exchanged for a peel-away sheath. A palindrome tunneled hemodialysis catheter measuring 19 cm from tip to cuff was tunneled in a retrograde fashion from the anterior chest wall to the venotomy incision. The catheter was then placed through the peel-away sheath with tips ultimately positioned within the superior aspect of the right atrium. Final catheter positioning was confirmed and documented with a spot radiographic image. The catheter aspirates and flushes normally. The catheter was flushed with appropriate volume heparin dwells. The catheter exit site was secured with a 0-Prolene retention suture. The venotomy incision was closed with Dermabond and Steri-strips. Dressings were applied. The patient tolerated the procedure well without immediate post procedural complication. IMPRESSION: Successful placement of 19 cm tip to cuff tunneled  hemodialysis catheter via the right internal jugular vein with tips terminating within the superior aspect of the right atrium. The catheter is ready for immediate use. Electronically Signed   By: Sandi Mariscal M.D.   On: 06/14/2020 15:57   DG Swallowing Func-Speech Pathology  Result Date: 06/15/2020 Objective Swallowing Evaluation: Type of Study: MBS-Modified Barium Swallow Study  Patient Details Name: JAKYA DOVIDIO MRN: [id number] Date of Birth: 1970/12/24 Today's Date: 06/15/2020 Time: SLP Start Time (ACUTE ONLY): 1439 -SLP Stop Time (ACUTE ONLY): 1500 SLP Time Calculation (min) (ACUTE ONLY): 21 min Past Medical History: Past Medical History: Diagnosis Date . Anxiety  . Arthritis   "joints ache all over" (10/15/2014) . Bulging lumbar disc  . Chronic lower back pain  . DDD (degenerative disc disease), cervical  . Depression  . Drug-seeking behavior  . Headache   "weekly" (10/15/2014) . Hyperlipemia  . Hypertension  . PTSD (post-traumatic stress disorder)  . Skin cancer   "had them cut off my arms; don't know what kind" Past Surgical History: Past Surgical History: Procedure Laterality Date . ABLATION ON ENDOMETRIOSIS  2008 . BIOPSY  12/27/2018  Procedure: BIOPSY;  Surgeon: Thornton Park, MD;  Location: [facility];  Service: Gastroenterology;; . ESOPHAGOGASTRODUODENOSCOPY (EGD) WITH PROPOFOL N/A 12/27/2018  Procedure: ESOPHAGOGASTRODUODENOSCOPY (EGD) WITH PROPOFOL;  Surgeon: Thornton Park, MD;  Location: [facility];  Service: Gastroenterology;  Laterality: N/A; . HEMORRHOID SURGERY  ~ 2002 . IR FLUORO GUIDE CV LINE RIGHT  06/14/2020 . IR US GUIDE VASC ACCESS RIGHT  06/14/2020 . ORIF ANKLE FRACTURE Right 03/28/2020  Procedure: OPEN REDUCTION INTERNAL FIXATION (ORIF) RIGHT BIMALLEOLAR ANKLE FRACTURE;  Surgeon: Marchia Bond, MD;  Location: [facility];  Service: Orthopedics;  Laterality: Right; HPI: [name] who apparently fell out of bed during the night and was [name] to be in  asystole, and she was intubated and underwent chest compressions as well as EPI with ROSC.  Pt was admitted for COVID-19 in mid January.  Pt was intubated from 2/16 - 2/20.  Subjective: Pt was alert and mildly confused Assessment / Plan / Recommendation CHL IP CLINICAL IMPRESSIONS 06/15/2020 Clinical Impression  Pt presents with oropharyngeal dysphagia characterized by decreased bolus cohesion and a pharyngeal delay. She demonstrated premature spillage to the valleculae and pyriform sinuses, penetration (PAS 3) of thin liquids via straw, and aspiration (PAS 7) of thin liquids when consecutive swallows were used.  Aspiration resulted in coughing, but this was ineffective in expelling the aspirated material. Throat clearing was intermittently noted while the fluoro was off and it is anticipated that at least some instances of penetration ultimately resulted in aspiration.The barium tablet became lodged in the valleculae and this appeared to be at least partly due to the [place]. A dysphagia 2 diet with nectar thick liquids will be initiated at this time. Pt has denied a history of dysphagia and it is anticipated that the pt's dysphagia is temporary secondary to prolonged intubation. SLP will follow to assess improvement in swallow function and for diet advancement. SLP Visit Diagnosis Dysphagia, oropharyngeal phase (R13.12) Attention and concentration deficit following -- Frontal lobe and executive function deficit following -- Impact on safety and function Mild aspiration risk   CHL IP TREATMENT RECOMMENDATION 06/15/2020 Treatment Recommendations Therapy as outlined in treatment plan below   Prognosis 06/15/2020 Prognosis for Safe Diet Advancement Good Barriers to Reach Goals -- Barriers/Prognosis Comment -- CHL IP DIET RECOMMENDATION 06/15/2020 SLP Diet Recommendations Dysphagia 2 (Fine chop) solids;Nectar thick liquid Liquid Administration via Cup;Straw Medication Administration Whole meds with puree Compensations Slow  rate;Small sips/bites Postural Changes Seated upright at 90 degrees   CHL IP OTHER RECOMMENDATIONS 06/15/2020 Recommended Consults -- Oral Care Recommendations Oral care BID Other Recommendations --   CHL IP FOLLOW UP RECOMMENDATIONS 06/15/2020 Follow up Recommendations Other (comment)   CHL IP FREQUENCY AND DURATION 06/15/2020 Speech Therapy Frequency (ACUTE ONLY) min 2x/week Treatment Duration 2 weeks      CHL IP ORAL PHASE 06/15/2020 Oral Phase Impaired Oral - Pudding Teaspoon -- Oral - Pudding Cup -- Oral - Honey Teaspoon -- Oral - Honey Cup -- Oral - Nectar Teaspoon -- Oral - Nectar Cup -- Oral - Nectar Straw Decreased bolus cohesion;Premature spillage Oral - Thin Teaspoon -- Oral - Thin Cup Decreased bolus cohesion;Premature spillage Oral - Thin Straw Decreased bolus cohesion;Premature spillage Oral - Puree WFL Oral - Mech Soft WFL Oral - Regular -- Oral - Multi-Consistency -- Oral - Pill -- Oral Phase - Comment --  CHL IP PHARYNGEAL PHASE 06/15/2020 Pharyngeal Phase Impaired Pharyngeal- Pudding Teaspoon -- Pharyngeal -- Pharyngeal- Pudding Cup -- Pharyngeal -- Pharyngeal- Honey Teaspoon -- Pharyngeal -- Pharyngeal- Honey Cup -- Pharyngeal -- Pharyngeal- Nectar Teaspoon -- Pharyngeal -- Pharyngeal- Nectar Cup -- Pharyngeal -- Pharyngeal- Nectar Straw Delayed swallow initiation-vallecula Pharyngeal -- Pharyngeal- Thin Teaspoon -- Pharyngeal -- Pharyngeal- Thin Cup Delayed swallow initiation-vallecula;Delayed swallow initiation-pyriform sinuses Pharyngeal -- Pharyngeal- Thin Straw Delayed swallow initiation-vallecula;Delayed swallow initiation-pyriform sinuses;Penetration/Aspiration during swallow;Penetration/Apiration after swallow Pharyngeal Material enters airway, remains ABOVE vocal cords and not ejected out;Material enters airway, passes BELOW cords and not ejected out despite cough attempt by patient Pharyngeal- Puree Delayed swallow initiation-vallecula Pharyngeal -- Pharyngeal- Mechanical Soft Delayed swallow  initiation-vallecula Pharyngeal -- Pharyngeal- Regular -- Pharyngeal -- Pharyngeal- Multi-consistency -- Pharyngeal -- Pharyngeal- Pill Delayed swallow initiation-vallecula;Pharyngeal residue - valleculae Pharyngeal -- Pharyngeal Comment --  CHL IP CERVICAL ESOPHAGEAL PHASE 06/15/2020 Cervical Esophageal Phase WFL Pudding Teaspoon -- Pudding Cup -- Honey Teaspoon -- Honey Cup -- Nectar Teaspoon -- Nectar Cup -- Nectar Straw -- Thin Teaspoon -- Thin Cup -- Thin Straw -- Puree -- Mechanical Soft -- Regular -- Multi-consistency -- Pill -- Cervical  Esophageal Comment -- Tobie Poet I. [place] Negus, [place], [place] Office number [phone] Pager [place] 06/15/2020, 3:35 PM                Medications:   . sodium chloride Stopped (06/15/20 1409)  . feeding supplement (VITAL AF 1.2 CAL) 60 mL/hr at 06/15/20 0400   . ARIPiprazole  5 mg Per Tube Daily  . B-complex with vitamin C  1 tablet Per Tube Daily  . chlorhexidine  15 mL Mouth Rinse BID  . Chlorhexidine Gluconate Cloth  6 each Topical Daily  . Chlorhexidine Gluconate Cloth  6 each Topical Q0600  . darbepoetin (ARANESP) injection - NON-DIALYSIS  40 mcg Subcutaneous Q Wed-1800  . feeding supplement (PROSource TF)  45 mL Per Tube Daily  . FLUoxetine  40 mg Per Tube Daily  . heparin injection (subcutaneous)  5,000 Units Subcutaneous Q8H  . insulin aspart  0-9 Units Subcutaneous Q4H  . mouth rinse  15 mL Mouth Rinse q12n4p  . midodrine  10 mg Per Tube TID WC  . pantoprazole sodium  40 mg Per Tube Q24H  . sodium chloride flush  10-40 mL Intracatheter Q12H   docusate, polyethylene glycol, Resource ThickenUp Clear, sodium chloride flush  Assessment/ Plan:  1.Acute kidney Injury:   Severe AKI with creatinine of 25 on arrival likely ATN.  Normal Cr at baseline. No signs of hydronephrosis or glomerular disease.  Mild elevation in CPK.   CRRT stopped on 2/21 after catheter malfunction and tunneled catheter with IR  on 2/22 and had HD on 2/23.   - plan for HD on 2/25.  Assess needs daily - lasix once today  2. Hypotension.  Pressors now off.  On midodrine 10 mg TID  3. Acute hypoxic resp failure - recent mild covid.  Off vent on supp oxygen    4. Encephalopathy - CT head and drug screen and airway management per CCM.  improved  5. Hypophosphatemia: repleted; resolved; 2/2 CRRT   6. Hyperkalemia   resolved with RRT  7. Normocytic anemia - secondary in part to CKD. For aranesp once 40 mcg on 2/24 - wasn't given on 2/23.  Weekly on Thursday. Iron panel ok  Claudia Desanctis, MD 06/16/2020 8:12 AM

## 2020-06-16 NOTE — Progress Notes (Signed)
PROGRESS NOTE  [name] UUV:[id number] DOB: [birth year]   PCP: Andreas Blower, MD  Patient is from: Home.  DOA: 06/08/2020 LOS: 8  Chief complaints: Cardiac arrest  Brief Narrative / Interim history: 50 year old F with PMH of PTSD, depression, anxiety, HTN, OA on chronic opiate and recent COVID-19 infection brought to ED due to cardiac arrest/asystole with successful ROSC with 3 minutes of CPR, epinephrine and King airway.  She was admitted to ICU for cardiac arrest/shock, acute hypoxemic respiratory failure, aspiration pneumonia, severe acidosis, AKI, hypothermia and hypokalemia.  She was intubated 2/16-2/20. Started on CRRT and transition to IHD on 2/23.  Received IV Zosyn from 2/16-2/22. Eventually transferred to [facility] service on 2/24 for further care.  Therapy recommended CIR  Subjective: Seen and examined earlier this morning.  No major events overnight of this morning.  Complaining of sharp lower abdominal pain.  She rates her pain 7/10.  Denies nausea, vomiting, diarrhea or UTI symptoms but anuric.  She had some bright red blood-tinged on the stool still likely from hemorrhoid.  She denies chest pain or dyspnea.  Objective: Vitals:   06/16/20 0800 06/16/20 0859 06/16/20 1000 06/16/20 1200  BP: 123/71  120/67 110/68  Pulse: 93 100 96 95  Resp: 14  15 20   Temp:    99.1 F (37.3 C)  TempSrc:    Oral  SpO2: 97% 95% 95% 94%  Weight:      Height:        Intake/Output Summary (Last 24 hours) at 06/16/2020 1322 Last data filed at 06/16/2020 1001 Gross per 24 hour  Intake 1371.6 ml  Output 0 ml  Net 1371.6 ml   Filed Weights   06/15/20 1555 06/15/20 1909 06/16/20 0415  Weight: 89 kg 89 kg 90.4 kg    Examination:  GENERAL: No apparent distress.  Nontoxic. HEENT: MMM.  Cortark in place. NECK: Supple.  No apparent JVD.  RESP: On room air.  No IWOB.  Fair aeration bilaterally. CVS:  RRR. Heart sounds normal.  ABD/GI/GU: BS+. Abd soft, NTND.  MSK/EXT:  Moves  extremities. No apparent deformity. No edema.  SKIN: no apparent skin lesion or wound NEURO: Awake, alert and oriented appropriately.  No apparent focal neuro deficit. PSYCH: Calm. Normal affect.  Procedures:  ETT 2/16 >> 2/20 Rt IJ HD cath 2/16 >> 2/22 Lt IJ CVL 2/16 >> 2/23  Rt femoral arterial line 2/18 >> 2/21 R Perm Cath (per IR) 2/22 >>  First IHD on 2/23  Microbiology summarized: BCx2 (HD cath) 2/16 >> negative  BCx2 2/16 >> negative  MRSA PCR 2/16 >> positive  Assessment & Plan: Acute hypoxic respiratory failure with compromised airway in setting of cardiac arrest and aspiration pneumonitis/pneumonia: Resolved.  Currently on room air. -IV Zosyn 2/16-2/22 -Intubated on 2/16 and extubated on 2/20.  -Incentive spirometry, OOB/PT/OT  Shock-likely combination of cardiac and septic: POA. UDS was positive for amphetamine on admission.   Treated for sepsis as above.  She is currently off IV pressor but on midodrine.  TTE without significant finding. -Continue midodrine 10 mg 3 times daily for now -Hold home antihypertensive meds.  Acute kidney injury likely ATN in the setting of shock, hypoxia and sepsis.  She is anuric.  Received CRRT and transition to IHD on 06/15/2020.  Normotensive for most part. -Nephrology managing.  Acute toxic and metabolic encephalopathy-due to the above and possible polypharmacy.  Resolved. -Reorientation and delirium precautions -Minimize or avoid sedating medications  Hx of depression, PTSD, anxiety and  history of prior suicide attempts.  Reportedly, she has been in bed since November 2021. -Evaluated by psych.  Appreciate recommendations.  Started Abilify and Prozac -Outpatient substance abuse program -Holding home Lamictal, risperidone, Zanaflex, trazodone and Xanax.  No evidence of withdrawal  Substance use: UDS positive for amphetamines "not prescribed".  Patient denies drug use. -Outpatient substance abuse program  Hyponatremia: Likely due  to AKI. -Monitor  Hypokalemia/hypophosphatemia-resolved.  Hx of HTN -Continue holding home lisinopril and HCTZ  Hyperglycemia/prediabetes: A1c 6.1% on 2/16. No results for input(s): HGBA1C in the last 72 hours. Recent Labs  Lab 06/15/20 2006 06/15/20 2334 06/16/20 0357 06/16/20 0820 06/16/20 1225  GLUCAP 166* 195* 189* 204* 157*  -Continue SSI-sensitive scale  Dysphagia-upgraded to dysphagia-2 diet by SLP -Already changing to nocturnal TF  Anemia of critical illness and possible ABLA: small bright red blood tinge on stool this morning. Some Polyps and erythema on her colonoscopy 2 years ago.  AVM overhead capsule endoscopy at the same time Recent Labs    06/08/20 2238 06/09/20 0446 06/09/20 0615 06/10/20 0359 06/10/20 0405 06/11/20 0306 06/12/20 0258 06/13/20 0328 06/15/20 0414 06/16/20 0423  HGB 11.6* 11.2* 11.2* 11.2* 11.2* 9.8* 9.3* 9.5* 7.4* 7.1*  -Consult [facility] consulted  -Discontinue subcu heparin.  SCD for VT prophylaxis -Recheck H&H this afternoon -Check anemia panel -Transfuse for Hgb less than 7  Mild thrombocytopenia, likely from sepsis.  Resolved.  Deconditioning: Due to depression and acute illness. -See plan above -PT / OT  Recent COVID-19 infection: Tested positive on 1/18.  Had a mild illness and did not require treatment or hospitalization. -Outside the window for isolation  Body mass index is 32.17 kg/m. Nutrition Problem: Inadequate oral intake Etiology: acute illness Signs/Symptoms: NPO status Interventions: Tube feeding   DVT prophylaxis:  Place and maintain sequential compression device Start: 06/16/20 1313 Place and maintain sequential compression device Start: 06/08/20 1602  Code Status: Full code Family Communication: Updated patient's husband over the phone. Level of care: Progressive Status is: Inpatient  Remains inpatient appropriate because:Ongoing diagnostic testing needed not appropriate for outpatient work up,  Unsafe d/c plan and Inpatient level of care appropriate due to severity of illness   Dispo: The patient is from: Home              Anticipated d/c is to: CIR              Patient currently is not medically stable to d/c.   Difficult to place patient No       Consultants:  PCCM Nephrology Gastroenterology   Sch Meds:  Scheduled Meds: . ARIPiprazole  5 mg Per Tube Daily  . chlorhexidine  15 mL Mouth Rinse BID  . Chlorhexidine Gluconate Cloth  6 each Topical Daily  . Chlorhexidine Gluconate Cloth  6 each Topical Q0600  . darbepoetin (ARANESP) injection - NON-DIALYSIS  40 mcg Subcutaneous Q Thu-1800  . feeding supplement  237 mL Oral BID BM  . feeding supplement (PROSource TF)  90 mL Per Tube BID  . feeding supplement (VITAL 1.5 CAL)  720 mL Per Tube Q24H  . FLUoxetine  40 mg Per Tube Daily  . insulin aspart  0-9 Units Subcutaneous Q4H  . mouth rinse  15 mL Mouth Rinse q12n4p  . midodrine  10 mg Per Tube TID WC  . multivitamin  1 tablet Oral QHS  . pantoprazole sodium  40 mg Per Tube Q24H  . sodium chloride flush  10-40 mL Intracatheter Q12H   Continuous  Infusions: . sodium chloride Stopped (06/15/20 1409)   PRN Meds:.acetaminophen, docusate, fentaNYL (SUBLIMAZE) injection, polyethylene glycol, Resource ThickenUp Clear, sodium chloride flush  Antimicrobials: Anti-infectives (From admission, onward)   Start     Dose/Rate Route Frequency Ordered Stop   06/15/20 1500  piperacillin-tazobactam (ZOSYN) IVPB 2.25 g  Status:  Discontinued        2.25 g 100 mL/hr over 30 Minutes Intravenous Every 8 hours 06/15/20 0827 06/15/20 1002   06/13/20 1800  piperacillin-tazobactam (ZOSYN) IVPB 3.375 g  Status:  Discontinued        3.375 g 12.5 mL/hr over 240 Minutes Intravenous Every 12 hours 06/13/20 1114 06/15/20 0827   06/08/20 1800  piperacillin-tazobactam (ZOSYN) IVPB 3.375 g  Status:  Discontinued        3.375 g 100 mL/hr over 30 Minutes Intravenous Every 6 hours 06/08/20 1608  06/13/20 1114       I have personally reviewed the following labs and images: CBC: Recent Labs  Lab 06/11/20 0306 06/12/20 0258 06/13/20 0328 06/15/20 0414 06/16/20 0423  WBC 13.5* 10.1 8.8 9.1 7.7  HGB 9.8* 9.3* 9.5* 7.4* 7.1*  HCT 30.1* 29.0* 30.3* 22.8* 22.5*  MCV 94.4 94.8 97.7 96.6 97.8  PLT 138* 112* 103* 152 189   BMP &GFR Recent Labs  Lab 06/11/20 1630 06/12/20 0258 06/12/20 1714 06/13/20 0328 06/13/20 1506 06/14/20 0423 06/15/20 0414 06/16/20 0423  NA 136 134*   < > 136 136 137 136 133*  K 4.1 4.0   < > 4.0 4.3 4.2 5.1 4.0  CL 102 101   < > 101 100 98 96* 95*  CO2 23 23   < > 26 23 24 22 25   GLUCOSE 166* 232*   < > 186* 232* 189* 205* 194*  BUN 10 13   < > 20 36* 58* 90* 38*  CREATININE 1.48* 1.48*   < > 1.38* 2.29* 3.34* 4.93* 2.76*  CALCIUM 7.8* 7.6*   < > 7.7* 7.7* 7.8* 8.2* 8.3*  MG 2.4 2.2  --  2.1  --  2.1 2.2  --   PHOS 1.0* 1.5*   < > 1.8* 3.7 4.1 4.4 3.1   < > = values in this interval not displayed.   Estimated Creatinine Clearance: 27.9 mL/min (A) (by C-G formula based on SCr of 2.76 mg/dL (H)). Liver & Pancreas: Recent Labs  Lab 06/13/20 0328 06/13/20 1506 06/14/20 0423 06/15/20 0414 06/16/20 0423  AST  --   --   --   --  40  ALT  --   --   --   --  42  ALKPHOS  --   --   --   --  96  BILITOT  --   --   --   --  0.7  PROT  --   --   --   --  5.7*  ALBUMIN 2.1* 2.2* 2.0* 1.9* 2.3*   No results for input(s): LIPASE, AMYLASE in the last 168 hours. No results for input(s): AMMONIA in the last 168 hours. Diabetic: No results for input(s): HGBA1C in the last 72 hours. Recent Labs  Lab 06/15/20 2006 06/15/20 2334 06/16/20 0357 06/16/20 0820 06/16/20 1225  GLUCAP 166* 195* 189* 204* 157*   Cardiac Enzymes: No results for input(s): CKTOTAL, CKMB, CKMBINDEX, TROPONINI in the last 168 hours. No results for input(s): PROBNP in the last 8760 hours. Coagulation Profile: No results for input(s): INR, PROTIME in the last 168  hours. Thyroid Function Tests: No  results for input(s): TSH, T4TOTAL, FREET4, T3FREE, THYROIDAB in the last 72 hours. Lipid Profile: No results for input(s): CHOL, HDL, LDLCALC, TRIG, CHOLHDL, LDLDIRECT in the last 72 hours. Anemia Panel: Recent Labs    06/15/20 0811  FERRITIN 394*  TIBC 168*  IRON 66   Urine analysis:    Component Value Date/Time   COLORURINE AMBER (A) 06/08/2020 1512   APPEARANCEUR CLOUDY (A) 06/08/2020 1512   LABSPEC 1.023 06/08/2020 1512   PHURINE 5.0 06/08/2020 1512   GLUCOSEU NEGATIVE 06/08/2020 1512   HGBUR NEGATIVE 06/08/2020 1512   [place] 06/08/2020 1512   [place] 06/08/2020 1512   PROTEINUR NEGATIVE 06/08/2020 1512   UROBILINOGEN 0.2 01/19/2015 0302   NITRITE NEGATIVE 06/08/2020 [place] 06/08/2020 1512   Sepsis Labs: Invalid input(s): PROCALCITONIN, [place]  Microbiology: Recent Results (from the past 240 hour(s))  MRSA PCR Screening     Status: Abnormal   Collection Time: 06/08/20  8:44 PM   Specimen: Nasopharyngeal  Result Value Ref Range Status   MRSA by PCR POSITIVE (A) NEGATIVE Final    Comment:        The GeneXpert MRSA Assay (FDA approved for NASAL specimens only), is one component of a comprehensive MRSA colonization surveillance program. It is not intended to diagnose MRSA infection nor to guide or monitor treatment for MRSA infections. RESULT CALLED TO, READ BACK BY AND VERIFIED WITH: Ferrel Logan 06/08/20 2359 JDW Performed at [facility], [address], [place], [place] [address]   Blood culture (routine x 2)     Status: None   Collection Time: 06/08/20  9:25 PM   Specimen: BLOOD  Result Value Ref Range Status   Specimen Description BLOOD HEMODIALYSIS CATHETER  Final   Special Requests   Final    BOTTLES DRAWN AEROBIC AND ANAEROBIC Blood Culture results may not be optimal due to an excessive volume of blood received in culture bottles   Culture   Final    NO GROWTH 5  DAYS Performed at [facility], [place] [address], [place], [place] [address]    Report Status 06/13/2020 FINAL  Final  Blood culture (routine x 2)     Status: None   Collection Time: 06/08/20  9:25 PM   Specimen: BLOOD LEFT WRIST  Result Value Ref Range Status   Specimen Description BLOOD LEFT WRIST  Final   Special Requests   Final    BOTTLES DRAWN AEROBIC ONLY Blood Culture adequate volume   Culture   Final    NO GROWTH 5 DAYS Performed at [facility], [place] [address], [place], [place] [address]    Report Status 06/14/2020 FINAL  Final    Radiology Studies: DG Swallowing Func-Speech Pathology  Result Date: 06/15/2020 Objective Swallowing Evaluation: Type of Study: MBS-Modified Barium Swallow Study  Patient Details Name: [name] MRN: [id number] Date of Birth: May 07, 1970 Today's Date: 06/15/2020 Time: SLP Start Time (ACUTE ONLY): 1439 -SLP Stop Time (ACUTE ONLY): 1500 SLP Time Calculation (min) (ACUTE ONLY): 21 min Past Medical History: Past Medical History: Diagnosis Date . Anxiety  . Arthritis   "joints ache all over" (10/15/2014) . Bulging lumbar disc  . Chronic lower back pain  . DDD (degenerative disc disease), cervical  . Depression  . Drug-seeking behavior  . Headache   "weekly" (10/15/2014) . Hyperlipemia  . Hypertension  . PTSD (post-traumatic stress disorder)  . Skin cancer   "had them cut off my arms; don't know  what kind" Past Surgical History: Past Surgical History: Procedure Laterality Date . ABLATION ON ENDOMETRIOSIS  2008 . BIOPSY  12/27/2018  Procedure: BIOPSY;  Surgeon: Thornton Park, MD;  Location: [facility];  Service: Gastroenterology;; . ESOPHAGOGASTRODUODENOSCOPY (EGD) WITH PROPOFOL N/A 12/27/2018  Procedure: ESOPHAGOGASTRODUODENOSCOPY (EGD) WITH PROPOFOL;  Surgeon: Thornton Park, MD;  Location: [facility];  Service: Gastroenterology;  Laterality: N/A; . HEMORRHOID SURGERY  ~ 2002 . IR FLUORO GUIDE CV LINE RIGHT  06/14/2020 . IR US GUIDE VASC ACCESS  RIGHT  06/14/2020 . ORIF ANKLE FRACTURE Right 03/28/2020  Procedure: OPEN REDUCTION INTERNAL FIXATION (ORIF) RIGHT BIMALLEOLAR ANKLE FRACTURE;  Surgeon: Marchia Bond, MD;  Location: [facility];  Service: Orthopedics;  Laterality: Right; HPI: [name] who apparently fell out of bed during the night and was found to be in asystole, and she was intubated and underwent chest compressions as well as EPI with ROSC.  Pt was admitted for COVID-19 in mid January.  Pt was intubated from 2/16 - 2/20.  Subjective: Pt was alert and mildly confused Assessment / Plan / Recommendation CHL IP CLINICAL IMPRESSIONS 06/15/2020 Clinical Impression Pt presents with oropharyngeal dysphagia characterized by decreased bolus cohesion and a pharyngeal delay. She demonstrated premature spillage to the valleculae and pyriform sinuses, penetration (PAS 3) of thin liquids via straw, and aspiration (PAS 7) of thin liquids when consecutive swallows were used.  Aspiration resulted in coughing, but this was ineffective in expelling the aspirated material. Throat clearing was intermittently noted while the fluoro was off and it is anticipated that at least some instances of penetration ultimately resulted in aspiration.The barium tablet became lodged in the valleculae and this appeared to be at least partly due to the [place]. A dysphagia 2 diet with nectar thick liquids will be initiated at this time. Pt has denied a history of dysphagia and it is anticipated that the pt's dysphagia is temporary secondary to prolonged intubation. SLP will follow to assess improvement in swallow function and for diet advancement. SLP Visit Diagnosis Dysphagia, oropharyngeal phase (R13.12) Attention and concentration deficit following -- Frontal lobe and executive function deficit following -- Impact on safety and function Mild aspiration risk   CHL IP TREATMENT RECOMMENDATION 06/15/2020 Treatment Recommendations Therapy as outlined in  treatment plan below   Prognosis 06/15/2020 Prognosis for Safe Diet Advancement Good Barriers to Reach Goals -- Barriers/Prognosis Comment -- CHL IP DIET RECOMMENDATION 06/15/2020 SLP Diet Recommendations Dysphagia 2 (Fine chop) solids;Nectar thick liquid Liquid Administration via Cup;Straw Medication Administration Whole meds with puree Compensations Slow rate;Small sips/bites Postural Changes Seated upright at 90 degrees   CHL IP OTHER RECOMMENDATIONS 06/15/2020 Recommended Consults -- Oral Care Recommendations Oral care BID Other Recommendations --   CHL IP FOLLOW UP RECOMMENDATIONS 06/15/2020 Follow up Recommendations Other (comment)   CHL IP FREQUENCY AND DURATION 06/15/2020 Speech Therapy Frequency (ACUTE ONLY) min 2x/week Treatment Duration 2 weeks      CHL IP ORAL PHASE 06/15/2020 Oral Phase Impaired Oral - Pudding Teaspoon -- Oral - Pudding Cup -- Oral - Honey Teaspoon -- Oral - Honey Cup -- Oral - Nectar Teaspoon -- Oral - Nectar Cup -- Oral - Nectar Straw Decreased bolus cohesion;Premature spillage Oral - Thin Teaspoon -- Oral - Thin Cup Decreased bolus cohesion;Premature spillage Oral - Thin Straw Decreased bolus cohesion;Premature spillage Oral - Puree WFL Oral - Mech Soft WFL Oral - Regular -- Oral - Multi-Consistency -- Oral - Pill -- Oral Phase - Comment --  CHL IP PHARYNGEAL  PHASE 06/15/2020 Pharyngeal Phase Impaired Pharyngeal- Pudding Teaspoon -- Pharyngeal -- Pharyngeal- Pudding Cup -- Pharyngeal -- Pharyngeal- Honey Teaspoon -- Pharyngeal -- Pharyngeal- Honey Cup -- Pharyngeal -- Pharyngeal- Nectar Teaspoon -- Pharyngeal -- Pharyngeal- Nectar Cup -- Pharyngeal -- Pharyngeal- Nectar Straw Delayed swallow initiation-vallecula Pharyngeal -- Pharyngeal- Thin Teaspoon -- Pharyngeal -- Pharyngeal- Thin Cup Delayed swallow initiation-vallecula;Delayed swallow initiation-pyriform sinuses Pharyngeal -- Pharyngeal- Thin Straw Delayed swallow initiation-vallecula;Delayed swallow initiation-pyriform  sinuses;Penetration/Aspiration during swallow;Penetration/Apiration after swallow Pharyngeal Material enters airway, remains ABOVE vocal cords and not ejected out;Material enters airway, passes BELOW cords and not ejected out despite cough attempt by patient Pharyngeal- Puree Delayed swallow initiation-vallecula Pharyngeal -- Pharyngeal- Mechanical Soft Delayed swallow initiation-vallecula Pharyngeal -- Pharyngeal- Regular -- Pharyngeal -- Pharyngeal- Multi-consistency -- Pharyngeal -- Pharyngeal- Pill Delayed swallow initiation-vallecula;Pharyngeal residue - valleculae Pharyngeal -- Pharyngeal Comment --  CHL IP CERVICAL ESOPHAGEAL PHASE 06/15/2020 Cervical Esophageal Phase WFL Pudding Teaspoon -- Pudding Cup -- Honey Teaspoon -- Honey Cup -- Nectar Teaspoon -- Nectar Cup -- Nectar Straw -- Thin Teaspoon -- Thin Cup -- Thin Straw -- Puree -- Mechanical Soft -- Regular -- Multi-consistency -- Pill -- Cervical Esophageal Comment -- [name], [place], [place] Office number [phone] Pager [phone] H[place] Marshall 06/15/2020, 3:35 PM              45 minutes with more than 50% spent in reviewing records, counseling patient/family and coordinating care.   Taye T. Menands  If 7PM-7AM, please contact night-coverage [email or web address] 06/16/2020, 1:22 PM

## 2020-06-16 NOTE — Progress Notes (Signed)
Nutrition Follow-up  DOCUMENTATION CODES:   Not applicable  INTERVENTION:   Tube Feeding via Cortrak: Transition to Nocturnal TF Change to Vital 1.5 at 60 ml/hr x 12 hours Pro-Source TF 90 mL BID (4 packets daily) Provides 93 g of protein, 1240 kcals and 547 mL of free water  Ensure Enlive po BID, each supplement provides 350 kcal and 20 grams of protein  Change B-complex with C to Rena-Vite daily by mouth   NUTRITION DIAGNOSIS:   Inadequate oral intake related to acute illness as evidenced by NPO status.  Being addressed via TF, diet advancement  GOAL:   Patient will meet greater than or equal to 90% of their needs  Met via nutrition support  MONITOR:   PO intake,Supplement acceptance,Labs,Weight trends  REASON FOR ASSESSMENT:   Consult,Ventilator Enteral/tube feeding initiation and management  ASSESSMENT:   50 yo female admitted post cardiac arrest likely from opiate/amphetamine overdose requiring intubation, AKI from ATN requiring CRRT. PMH includes PTSD, depression, HTN, HLD, COVID-19 infection (04/2020)  2/16 Admitted, Intubated, CRRT initiated 2/18 Cortrak placed 2/20 Extubated 2/21 CRRT discontinued 2/23 First iHD with no net UF, MBS with diet advancement to Dysphagia 2, Nectar thick  Noted plan for iHD again on 2/25  Diet advanced yesterday but appetite remains poor. No recorded po intake   Vital AF 1.2 at 60 ml/hr via Cortrak. Held this AM due to nausea and "fullness" post med administration +BM, abdomen soft  Labs: sodium 133 Meds: ss novolog  Diet Order:   Diet Order            DIET DYS 2 Room service appropriate? Yes with Assist; Fluid consistency: Nectar Thick  Diet effective now                 EDUCATION NEEDS:   Not appropriate for education at this time  Skin:  Skin Assessment: Reviewed RN Assessment  Last BM:  2/23  Height:   Ht Readings from Last 1 Encounters:  06/08/20 5' 6"  (1.676 m)    Weight:   Wt Readings from  Last 1 Encounters:  06/16/20 90.4 kg    BMI:  Body mass index is 32.17 kg/m.  Estimated Nutritional Needs:   Kcal:  2100-2300 kcals  Protein:  105-120 g  Fluid:  >/= 1.8 L   Kerman Passey MS, RDN, LDN, CNSC Registered Dietitian III Clinical Nutrition RD Pager and On-Call Pager Number Located in [place]

## 2020-06-16 NOTE — Plan of Care (Signed)
  Problem: Clinical Measurements: Goal: Respiratory complications will improve Outcome: Progressing   Problem: Activity: Goal: Risk for activity intolerance will decrease Outcome: Progressing   Problem: Nutrition: Goal: Adequate nutrition will be maintained Outcome: Progressing Note: Tube feeds stopped to allow patient to have increased PO intake with meals.   Problem: Elimination: Goal: Will not experience complications related to bowel motility Outcome: Progressing Note: Last BM 2/24    Problem: Safety: Goal: Ability to remain free from injury will improve Outcome: Progressing   Problem: Skin Integrity: Goal: Risk for impaired skin integrity will decrease Outcome: Progressing   Problem: Pain Managment: Goal: General experience of comfort will improve Outcome: Not Progressing Note: Pt having persistent abdominal pain.PRN pain medications and KUB ordered per MD.

## 2020-06-16 NOTE — Progress Notes (Signed)
  Speech Language Pathology Treatment: Dysphagia  Patient Details Name: [name] MRN: [id number] DOB: 1971/01/31 Today's Date: 06/16/2020 Time: 2542-7062 SLP Time Calculation (min) (ACUTE ONLY): 25 min  Assessment / Plan / Recommendation Clinical Impression  Followed up for diet tolerance. Reviewed findings from MBSS with pt including diet recommendations. Pt upright in chair, reports PO appetite is poor. Coordinated with dietitian, nursing, MD as tube feeds per cortrak are currently continuous. Pt agreeable for trials of regular water and puree. No overt s/sx of aspiration with any POs this date however trials were limited. Recommend additional diet analysis given aspiration noted on MBSS with thins yesterday. Call placed for patients spouse, unable to reach this date to coordinate to bring patients dentures to assist with diet advancement. RN to notify pts spouse, if at hospital this date. SLP to follow up.     HPI HPI: [name] who apparently fell out of bed during the night and was found to be in asystole, and she was intubated and underwent chest compressions as well as EPI with ROSC.  Pt was admitted for COVID-19 in mid January.  Pt was intubated from 2/16 - 2/20.      SLP Plan  Continue with current plan of care       Recommendations  Diet recommendations: Dysphagia 2 (fine chop);Nectar-thick liquid (water in between meals following oral care) Liquids provided via: Cup;Straw Medication Administration: Whole meds with puree Supervision: Patient able to self feed;Full supervision/cueing for compensatory strategies Compensations: Slow rate;Small sips/bites;Minimize environmental distractions Postural Changes and/or Swallow Maneuvers: Seated upright 90 degrees;Upright 30-60 min after meal                Oral Care Recommendations: Oral care BID;Oral care prior to ice chip/H20 Follow up Recommendations: Inpatient Rehab SLP Visit Diagnosis: Dysphagia,  oropharyngeal phase (R13.12) Plan: Continue with current plan of care       [place], CCC-SLP Acute Rehabilitation Services   06/16/2020, 10:25 AM

## 2020-06-16 NOTE — Progress Notes (Signed)
Inpatient Rehabilitation Admissions Coordinator  Inpatient rehab consult received. I met with patient at bedside for rehab assessment. We discussed goals and expectations of a possible CIR admit pending her functional progress with therapies as her medical issues resolve. I will follow her progress to assist with planning dispo options as appropriate.  Danne Baxter, RN, MSN Rehab Admissions Coordinator [phone] 06/16/2020 12:21 PM

## 2020-06-17 ENCOUNTER — Encounter (HOSPITAL_COMMUNITY): Payer: Self-pay | Admitting: Pulmonary Disease

## 2020-06-17 DIAGNOSIS — J9601 Acute respiratory failure with hypoxia: Secondary | ICD-10-CM | POA: Diagnosis not present

## 2020-06-17 DIAGNOSIS — E871 Hypo-osmolality and hyponatremia: Secondary | ICD-10-CM | POA: Diagnosis not present

## 2020-06-17 DIAGNOSIS — K921 Melena: Secondary | ICD-10-CM | POA: Diagnosis not present

## 2020-06-17 DIAGNOSIS — R1030 Lower abdominal pain, unspecified: Secondary | ICD-10-CM | POA: Diagnosis not present

## 2020-06-17 DIAGNOSIS — R739 Hyperglycemia, unspecified: Secondary | ICD-10-CM | POA: Diagnosis not present

## 2020-06-17 DIAGNOSIS — I469 Cardiac arrest, cause unspecified: Secondary | ICD-10-CM | POA: Diagnosis not present

## 2020-06-17 LAB — RENAL FUNCTION PANEL
Albumin: 2.3 g/dL — ABNORMAL LOW (ref 3.5–5.0)
Anion gap: 15 (ref 5–15)
BUN: 64 mg/dL — ABNORMAL HIGH (ref 6–20)
CO2: 22 mmol/L (ref 22–32)
Calcium: 8.9 mg/dL (ref 8.9–10.3)
Chloride: 93 mmol/L — ABNORMAL LOW (ref 98–111)
Creatinine, Ser: 4.34 mg/dL — ABNORMAL HIGH (ref 0.44–1.00)
GFR, Estimated: 12 mL/min — ABNORMAL LOW (ref >60)
Glucose, Bld: 141 mg/dL — ABNORMAL HIGH (ref 70–99)
Phosphorus: 4.1 mg/dL (ref 2.5–4.6)
Potassium: 4.3 mmol/L (ref 3.5–5.1)
Sodium: 130 mmol/L — ABNORMAL LOW (ref 135–145)

## 2020-06-17 LAB — BPAM RBC
Blood Product Expiration Date: 202203242359
ISSUE DATE / TIME: 202202241803
Unit Type and Rh: 5100

## 2020-06-17 LAB — TYPE AND SCREEN
ABO/RH(D): O POS
Antibody Screen: NEGATIVE
Unit division: 0

## 2020-06-17 LAB — MAGNESIUM: Magnesium: 1.9 mg/dL (ref 1.7–2.4)

## 2020-06-17 LAB — CBC
HCT: 27.7 % — ABNORMAL LOW (ref 36.0–46.0)
Hemoglobin: 9.1 g/dL — ABNORMAL LOW (ref 12.0–15.0)
MCH: 30.2 pg (ref 26.0–34.0)
MCHC: 32.9 g/dL (ref 30.0–36.0)
MCV: 92 fL (ref 80.0–100.0)
Platelets: 295 10*3/uL (ref 150–400)
RBC: 3.01 MIL/uL — ABNORMAL LOW (ref 3.87–5.11)
RDW: 14.6 % (ref 11.5–15.5)
WBC: 10.3 10*3/uL (ref 4.0–10.5)
nRBC: 0 % (ref 0.0–0.2)

## 2020-06-17 LAB — GLUCOSE, CAPILLARY
Glucose-Capillary: 221 mg/dL — ABNORMAL HIGH (ref 70–99)
Glucose-Capillary: 156 mg/dL — ABNORMAL HIGH (ref 70–99)
Glucose-Capillary: 113 mg/dL — ABNORMAL HIGH (ref 70–99)
Glucose-Capillary: 167 mg/dL — ABNORMAL HIGH (ref 70–99)
Glucose-Capillary: 159 mg/dL — ABNORMAL HIGH (ref 70–99)

## 2020-06-17 MED ORDER — DICYCLOMINE HCL 10 MG PO CAPS
10.0000 mg | ORAL_CAPSULE | Freq: Three times a day (TID) | ORAL | Status: AC
  Administered 2020-06-17 – 2020-06-19 (×7): 10 mg
  Filled 2020-06-17 (×8): qty 1

## 2020-06-17 MED ORDER — HEPARIN SODIUM (PORCINE) 1000 UNIT/ML IJ SOLN
INTRAMUSCULAR | Status: AC
  Filled 2020-06-17: qty 4

## 2020-06-17 MED ORDER — RENA-VITE PO TABS
1.0000 | ORAL_TABLET | Freq: Every day | ORAL | Status: DC
  Administered 2020-06-17 – 2020-06-22 (×4): 1
  Filled 2020-06-17 (×4): qty 1

## 2020-06-17 NOTE — Progress Notes (Signed)
PROGRESS NOTE  [name] WNU:[id number] DOB: 05-30-1970   PCP: Andreas Blower, MD  Patient is from: Home.  DOA: 06/08/2020 LOS: 9  Chief complaints: Cardiac arrest  Brief Narrative / Interim history: 50 year old F with PMH of PTSD, depression, anxiety, HTN, OA on chronic opiate and recent COVID-19 infection brought to ED due to cardiac arrest/asystole with successful ROSC with 3 minutes of CPR, epinephrine and King airway.  She was admitted to ICU for cardiac arrest/shock, acute hypoxemic respiratory failure, aspiration pneumonia, severe acidosis, AKI, hypothermia and hypokalemia.  She was intubated 2/16-2/20. Started on CRRT and transitioned to IHD on 06/15/20.  Received IV Zosyn from 2/16-2/22. Eventually transferred to [facility] service on 2/24 for further care.  Patient had drop in her hemoglobin.  She also had some hematochezia likely from hemorrhoid.  GI consulted.  She was transfused 1 unit with appropriate response, and H&H remained stable.  Therapy recommended CIR  Subjective: Seen and examined earlier this morning.  No major events overnight of this morning.  No complaints.  Abdominal pain improved.  She denies chest pain, dyspnea, nausea or vomiting.  She says she had a bowel movement last night.  She does not think there is blood in stool.  There is one unmeasured void charted but patient does not remember.  Husband at bedside Objective: Vitals:   06/17/20 1400 06/17/20 1430 06/17/20 1500 06/17/20 1530  BP: 128/68 113/67 110/67 108/66  Pulse: 98 95 95 98  Resp: 17  13 14   Temp:    98.8 F (37.1 C)  TempSrc:    Oral  SpO2:    95%  Weight:    90 kg  Height:        Intake/Output Summary (Last 24 hours) at 06/17/2020 1739 Last data filed at 06/17/2020 1530 Gross per 24 hour  Intake 875 ml  Output 900 ml  Net -25 ml   Filed Weights   06/17/20 0347 06/17/20 1215 06/17/20 1530  Weight: 92.1 kg 91 kg 90 kg    Examination: GENERAL: No apparent distress.   Nontoxic. HEENT: MMM.  Vision and hearing grossly intact.  NECK: Supple.  No apparent JVD.  RESP: On RA.  No IWOB.  Fair aeration bilaterally. CVS:  RRR. Heart sounds normal.  ABD/GI/GU: BS+. Abd soft, NTND.  MSK/EXT:  Moves extremities. No apparent deformity.  Trace edema in hands SKIN: no apparent skin lesion or wound NEURO: Awake, alert and oriented appropriately.  No apparent focal neuro deficit other than generalized weakness. PSYCH: Calm. Normal affect.  Procedures:  ETT 2/16 >> 2/20 Rt IJ HD cath 2/16 >> 2/22 Lt IJ CVL 2/16 >> 2/23  Rt femoral arterial line 2/18 >> 2/21 R Perm Cath (per IR) 2/22 >>  First IHD on 2/23  Microbiology summarized: BCx2 (HD cath) 2/16 >> negative  BCx2 2/16 >> negative  MRSA PCR 2/16 >> positive  Assessment & Plan: Acute hypoxic respiratory failure with compromised airway in setting of cardiac arrest and aspiration pneumonitis/pneumonia: Resolved.  Currently on room air. -Evaluated by cardiology, Dr. Gwenlyn Found in ED. TTE without significant finding. -IV Zosyn 2/16-2/22 -Intubated on 2/16 and extubated on 2/20.  -Incentive spirometry, OOB/PT/OT  Shock-likely combination of cardiac and septic: POA. UDS was positive for amphetamine on admission.   Treated for sepsis as above. TTE without significant finding.  BP stable on midodrine. -Continue midodrine 10 mg 3 times daily for now -Hold home antihypertensive meds.  Anuric AKI/azotemia: Likely ATN in the setting of shock, hypoxia and  sepsis.  Received CRRT and transitioned to IHD on 06/15/2020.  Normotensive for most part. -Nephrology managing.  Acute toxic and metabolic encephalopathy-due to the above and possible polypharmacy.  Resolved. -Reorientation and delirium precautions -Minimize or avoid sedating medications  Hx of depression, PTSD, anxiety and history of prior suicide attempts.  Reportedly, she has been in bed since November 2021. -Evaluated by psych.  Appreciate recommendations.   Started Abilify and Prozac -Outpatient psych follow-up and outpatient substance abuse program -Holding home Lamictal, risperidone, Zanaflex, trazodone and Xanax.  No evidence of withdrawal  Substance use: UDS positive for amphetamines "not prescribed".  Patient denies drug use. -Outpatient substance abuse program  Hyponatremia: Likely due to renal failure. -Monitor  Hypokalemia/hypophosphatemia-resolved.  Hx of hypertension -Continue holding home lisinopril and HCTZ  Hyperglycemia/prediabetes: A1c 6.1% on 2/16. No results for input(s): HGBA1C in the last 72 hours. Recent Labs  Lab 06/16/20 1917 06/16/20 2344 06/17/20 0317 06/17/20 0800 06/17/20 1633  GLUCAP 155* 199* 221* 156* 113*  -Continue SSI-sensitive scale  Dysphagia-upgraded to dysphagia-2 diet by SLP -Already changing to nocturnal TF  Anemia of critical illness and possible ABLA: small hematochezia in stool the morning of 2/24. H&H stable after 1 unit.   Recent Labs    06/10/20 0359 06/10/20 0405 06/11/20 0306 06/12/20 0258 06/13/20 0328 06/15/20 0414 06/16/20 0423 06/16/20 1401 06/16/20 2143 06/17/20 1015  HGB 11.2* 11.2* 9.8* 9.3* 9.5* 7.4* 7.1* 7.0* 8.7* 9.1*  -Monitor H&H -Transfuse for Hgb less than 7 -GI signed off.  Mild thrombocytopenia, likely from sepsis.  Resolved.  Deconditioning: Due to depression and acute illness. -See plan above -PT / OT-recommended CIR  Recent COVID-19 infection: Tested positive on 1/18.  Had a mild illness and did not require treatment or hospitalization. -Outside the window for isolation  Class I obesity Body mass index is 32.02 kg/m. Nutrition Problem: Inadequate oral intake Etiology: acute illness Signs/Symptoms: NPO status Interventions: Tube feeding   DVT prophylaxis:  Place and maintain sequential compression device Start: 06/16/20 1313 Place and maintain sequential compression device Start: 06/08/20 1602 Will resume subcu heparin if H&H remains  stable tomorrow.  Code Status: Full code Family Communication: Updated patient's husband at bedside. Level of care: Telemetry Medical Status is: Inpatient  Remains inpatient appropriate because:Unsafe d/c plan and Inpatient level of care appropriate due to severity of illness   Dispo: The patient is from: Home              Anticipated d/c is to: CIR              Patient currently is not medically stable to d/c.   Difficult to place patient No       Consultants:  PCCM-signed off Nephrology-following Gastroenterology-signed off   Sch Meds:  Scheduled Meds:  sodium chloride   Intravenous Once   ARIPiprazole  5 mg Per Tube Daily   chlorhexidine  15 mL Mouth Rinse BID   Chlorhexidine Gluconate Cloth  6 each Topical Daily   Chlorhexidine Gluconate Cloth  6 each Topical Q0600   Chlorhexidine Gluconate Cloth  6 each Topical Q0600   darbepoetin (ARANESP) injection - NON-DIALYSIS  40 mcg Subcutaneous Q Thu-1800   dicyclomine  10 mg Per Tube TID AC   feeding supplement  237 mL Oral BID BM   feeding supplement (PROSource TF)  90 mL Per Tube BID   feeding supplement (VITAL 1.5 CAL)  720 mL Per Tube Q24H   FLUoxetine  40 mg Per Tube Daily   hydrocortisone  25 mg Rectal BID   insulin aspart  0-9 Units Subcutaneous Q4H   mouth rinse  15 mL Mouth Rinse q12n4p   midodrine  10 mg Per Tube TID WC   multivitamin  1 tablet Per Tube QHS   pantoprazole sodium  40 mg Per Tube Q24H   sodium chloride flush  10-40 mL Intracatheter Q12H   Continuous Infusions:  sodium chloride Stopped (06/15/20 1409)   PRN Meds:.acetaminophen, docusate, fentaNYL (SUBLIMAZE) injection, polyethylene glycol, Resource ThickenUp Clear, sodium chloride flush  Antimicrobials: Anti-infectives (From admission, onward)   Start     Dose/Rate Route Frequency Ordered Stop   06/15/20 1500  piperacillin-tazobactam (ZOSYN) IVPB 2.25 g  Status:  Discontinued        2.25 g 100 mL/hr over 30 Minutes  Intravenous Every 8 hours 06/15/20 0827 06/15/20 1002   06/13/20 1800  piperacillin-tazobactam (ZOSYN) IVPB 3.375 g  Status:  Discontinued        3.375 g 12.5 mL/hr over 240 Minutes Intravenous Every 12 hours 06/13/20 1114 06/15/20 0827   06/08/20 1800  piperacillin-tazobactam (ZOSYN) IVPB 3.375 g  Status:  Discontinued        3.375 g 100 mL/hr over 30 Minutes Intravenous Every 6 hours 06/08/20 1608 06/13/20 1114       I have personally reviewed the following labs and images: CBC: Recent Labs  Lab 06/12/20 0258 06/13/20 0328 06/15/20 0414 06/16/20 0423 06/16/20 1401 06/16/20 2143 06/17/20 1015  WBC 10.1 8.8 9.1 7.7  --   --  10.3  HGB 9.3* 9.5* 7.4* 7.1* 7.0* 8.7* 9.1*  HCT 29.0* 30.3* 22.8* 22.5* 21.9* 26.3* 27.7*  MCV 94.8 97.7 96.6 97.8  --   --  92.0  PLT 112* 103* 152 189  --   --  295   BMP &GFR Recent Labs  Lab 06/12/20 0258 06/12/20 1714 06/13/20 0328 06/13/20 1506 06/14/20 0423 06/15/20 0414 06/16/20 0423 06/17/20 1015  NA 134*   < > 136 136 137 136 133* 130*  K 4.0   < > 4.0 4.3 4.2 5.1 4.0 4.3  CL 101   < > 101 100 98 96* 95* 93*  CO2 23   < > 26 23 24 22 25 22   GLUCOSE 232*   < > 186* 232* 189* 205* 194* 141*  BUN 13   < > 20 36* 58* 90* 38* 64*  CREATININE 1.48*   < > 1.38* 2.29* 3.34* 4.93* 2.76* 4.34*  CALCIUM 7.6*   < > 7.7* 7.7* 7.8* 8.2* 8.3* 8.9  MG 2.2  --  2.1  --  2.1 2.2  --  1.9  PHOS 1.5*   < > 1.8* 3.7 4.1 4.4 3.1 4.1   < > = values in this interval not displayed.   Estimated Creatinine Clearance: 17.7 mL/min (A) (by C-G formula based on SCr of 4.34 mg/dL (H)). Liver & Pancreas: Recent Labs  Lab 06/13/20 1506 06/14/20 0423 06/15/20 0414 06/16/20 0423 06/17/20 1015  AST  --   --   --  40  --   ALT  --   --   --  42  --   ALKPHOS  --   --   --  96  --   BILITOT  --   --   --  0.7  --   PROT  --   --   --  5.7*  --   ALBUMIN 2.2* 2.0* 1.9* 2.3* 2.3*   No results for input(s): LIPASE, AMYLASE in  the last 168 hours. No results  for input(s): AMMONIA in the last 168 hours. Diabetic: No results for input(s): HGBA1C in the last 72 hours. Recent Labs  Lab 06/16/20 1917 06/16/20 2344 06/17/20 0317 06/17/20 0800 06/17/20 1633  GLUCAP 155* 199* 221* 156* 113*   Cardiac Enzymes: No results for input(s): CKTOTAL, CKMB, CKMBINDEX, TROPONINI in the last 168 hours. No results for input(s): PROBNP in the last 8760 hours. Coagulation Profile: No results for input(s): INR, PROTIME in the last 168 hours. Thyroid Function Tests: No results for input(s): TSH, T4TOTAL, FREET4, T3FREE, THYROIDAB in the last 72 hours. Lipid Profile: No results for input(s): CHOL, HDL, LDLCALC, TRIG, CHOLHDL, LDLDIRECT in the last 72 hours. Anemia Panel: Recent Labs    06/15/20 0811 06/16/20 1401  VITAMINB12  --  844  FOLATE  --  6.5  FERRITIN 394* 347*  TIBC 168* 193*  IRON 66 62  RETICCTPCT  --  3.6*   Urine analysis:    Component Value Date/Time   COLORURINE AMBER (A) 06/08/2020 1512   APPEARANCEUR CLOUDY (A) 06/08/2020 1512   LABSPEC 1.023 06/08/2020 1512   PHURINE 5.0 06/08/2020 1512   GLUCOSEU NEGATIVE 06/08/2020 1512   HGBUR NEGATIVE 06/08/2020 1512   [place] 06/08/2020 1512   [place] 06/08/2020 1512   PROTEINUR NEGATIVE 06/08/2020 1512   UROBILINOGEN 0.2 01/19/2015 0302   NITRITE NEGATIVE 06/08/2020 1512   LEUKOCYTESUR NEGATIVE 06/08/2020 1512   Sepsis Labs: Invalid input(s): PROCALCITONIN, [place]  Microbiology: Recent Results (from the past 240 hour(s))  MRSA PCR Screening     Status: Abnormal   Collection Time: 06/08/20  8:44 PM   Specimen: Nasopharyngeal  Result Value Ref Range Status   MRSA by PCR POSITIVE (A) NEGATIVE Final    Comment:        The GeneXpert MRSA Assay (FDA approved for NASAL specimens only), is one component of a comprehensive MRSA colonization surveillance program. It is not intended to diagnose MRSA infection nor to guide or monitor treatment for MRSA  infections. RESULT CALLED TO, READ BACK BY AND VERIFIED WITH: Ferrel Logan 06/08/20 2359 JDW Performed at [facility], [address] [place], [place] [address]   Blood culture (routine x 2)     Status: None   Collection Time: 06/08/20  9:25 PM   Specimen: BLOOD  Result Value Ref Range Status   Specimen Description BLOOD HEMODIALYSIS CATHETER  Final   Special Requests   Final    BOTTLES DRAWN AEROBIC AND ANAEROBIC Blood Culture results may not be optimal due to an excessive volume of blood received in culture bottles   Culture   Final    NO GROWTH 5 DAYS Performed at [facility], [place] [address] [place], [place] [address]    Report Status 06/13/2020 FINAL  Final  Blood culture (routine x 2)     Status: None   Collection Time: 06/08/20  9:25 PM   Specimen: BLOOD LEFT WRIST  Result Value Ref Range Status   Specimen Description BLOOD LEFT WRIST  Final   Special Requests   Final    BOTTLES DRAWN AEROBIC ONLY Blood Culture adequate volume   Culture   Final    NO GROWTH 5 DAYS Performed at [facility], [place] [address], [place],  [address]    Report Status 06/14/2020 FINAL  Final    Radiology Studies: No results found.   Airiel Oblinger T. [place]  If 7PM-7AM, please contact night-coverage [email or web address]  06/17/2020, 5:39 PM

## 2020-06-17 NOTE — Progress Notes (Addendum)
Attending physician's note   I have taken an interval history, reviewed the chart and discussed her care on rounds. I agree with the Advanced Practitioner's note, impression, and recommendations as outlined.  No further bleeding overnight.  Transfused 1 unit PRBCs yesterday for hemoglobin 7.0, with repeat 8.7.  HD today.  -Continue conservative care for isolated episode of anorectal bleeding -Single episode of bleeding does not seem to be etiology for recent decline in serum Hgb.  Can observe for e/o overt GI re-bleeding, but otherwise, given recent significant acute events, do not feel that diagnostic endoscopic evaluation is needed at this time.  Certainly if she should have overt bleeding, endoscopy for therapeutic intent can be sought -GI service will sign off at this time.  Please do not hesitate to contact us with additional questions or concerns, or if suspicion for acute bleeding for more proximal etiology  [place], DO, FACG [phone] office            Progress Note  Chief Complaint:  Anemia , lower abdominal pain, minor rectal bleeding       ASSESSMENT / PLAN:    Brief summary: 50 yo female with pmh of anxiety /depression, obesity, seizure, chronic pain, cyclic vomiting, COVID. Admitted with cardiac arrest possibly secondary opioid overdose, respiratory failure / asp pna, extubated on 2/20,  AKI. Started on CRRT transitioned to HD   # Rectal bleeding ( episode yesterday).   She had pain on attempted DRE yesterday raising concern for fissure though one not seen on exam.  She does have hemorrhoids.  --Will treat with topical steroids --Normal brown stool without blood this am  # Acute on chronic normocytic anemia of unclear etiology. Hgb was at baseline on admission (10-11) but drifted down to 7. No overt GI bleeding at home but staff noticed bright red blood in stool yesterday. Hgb had been declining prior to the episode of bleeding yesterday. Some of the hgb  decline may be dilutional. Getting Aranesp.  --Just had a normal brown stool without blood.  --Her hgb improved to 8.7 post a unit of blood yesterday. --No plans for endoscopic evaluation right now. See below  # History of iron deficiency in 2020. Extensive GI workup including EGD ( non H.pylori gastritis, and Barrett's esophagus), colonoscopy ( two small adenomas) and VCE ( 1 small nonbleeding AVM). Ferritin this admission in in 300s but could be elevated as an acute phase reactant. Her TIBC is low, speaking more towards chronic disease.      # Barrett's esophagus diagnosed 2020. Added to PMH --Continue PPI          SUBJECTIVE:   Feels okay. Just had BM which was normal. She endorses mid abdominal pain but says she was also have that at home prior to admission. The pain gets better with BM   OBJECTIVE:    Scheduled inpatient medications:  . sodium chloride   Intravenous Once  . ARIPiprazole  5 mg Per Tube Daily  . chlorhexidine  15 mL Mouth Rinse BID  . Chlorhexidine Gluconate Cloth  6 each Topical Daily  . Chlorhexidine Gluconate Cloth  6 each Topical Q0600  . Chlorhexidine Gluconate Cloth  6 each Topical Q0600  . darbepoetin (ARANESP) injection - NON-DIALYSIS  40 mcg Subcutaneous Q Thu-1800  . dicyclomine  10 mg Oral TID AC  . feeding supplement  237 mL Oral BID BM  . feeding supplement (PROSource TF)  90 mL Per Tube BID  . feeding supplement (VITAL 1.5  CAL)  720 mL Per Tube Q24H  . FLUoxetine  40 mg Per Tube Daily  . hydrocortisone  25 mg Rectal BID  . insulin aspart  0-9 Units Subcutaneous Q4H  . mouth rinse  15 mL Mouth Rinse q12n4p  . midodrine  10 mg Per Tube TID WC  . multivitamin  1 tablet Oral QHS  . pantoprazole sodium  40 mg Per Tube Q24H  . sodium chloride flush  10-40 mL Intracatheter Q12H   Continuous inpatient infusions:  . sodium chloride Stopped (06/15/20 1409)   PRN inpatient medications: acetaminophen, docusate, fentaNYL (SUBLIMAZE) injection,  polyethylene glycol, Resource ThickenUp Clear, sodium chloride flush  Vital signs in last 24 hours: Temp:  [97.9 F (36.6 C)-99.1 F (37.3 C)] 98.8 F (37.1 C) (02/25 0700) Pulse Rate:  [85-104] 92 (02/25 0700) Resp:  [14-20] 14 (02/25 0700) BP: (93-124)/(58-74) 109/74 (02/25 0700) SpO2:  [93 %-96 %] 96 % (02/25 0700) Weight:  [92.1 kg] 92.1 kg (02/25 0347) Last BM Date: 06/16/20  Intake/Output Summary (Last 24 hours) at 06/17/2020 0842 Last data filed at 06/17/2020 0200 Gross per 24 hour  Intake 1245 ml  Output --  Net 1245 ml     Physical Exam:  . General: Alert female in NAD . Heart:  Regular rate and rhythm. No lower extremity edema . Pulmonary: Normal respiratory effort . Abdomen: Soft, nondistended, mild mid abdominal tenderness. Normal bowel sounds.  . Neurologic: Alert and oriented . Psych: Pleasant. Cooperative.   Filed Weights   06/15/20 1909 06/16/20 0415 06/17/20 0347  Weight: 89 kg 90.4 kg 92.1 kg    Intake/Output from previous day: 02/24 0701 - 02/25 0700 In: 1610 [P.O.:120; Blood:335; NG/GT:850] Out: -  Intake/Output this shift: No intake/output data recorded.    Lab Results: Recent Labs    06/15/20 0414 06/16/20 0423 06/16/20 1401 06/16/20 2143  WBC 9.1 7.7  --   --   HGB 7.4* 7.1* 7.0* 8.7*  HCT 22.8* 22.5* 21.9* 26.3*  PLT 152 189  --   --    BMET Recent Labs    06/15/20 0414 06/16/20 0423  NA 136 133*  K 5.1 4.0  CL 96* 95*  CO2 22 25  GLUCOSE 205* 194*  BUN 90* 38*  CREATININE 4.93* 2.76*  CALCIUM 8.2* 8.3*   LFT Recent Labs    06/16/20 0423  PROT 5.7*  ALBUMIN 2.3*  AST 40  ALT 42  ALKPHOS 96  BILITOT 0.7   PT/INR No results for input(s): LABPROT, INR in the last 72 hours. Hepatitis Panel Recent Labs    06/15/20 1649  HEPBSAG NON REACTIVE    DG Abd Portable 1V  Result Date: 06/16/2020 CLINICAL DATA:  Abdominal pain EXAM: PORTABLE ABDOMEN - 1 VIEW COMPARISON:  May 11, 2020 FINDINGS: Enteric tube tip is  in the distal stomach. There is contrast in the stomach. There also foci of contrast in the distal colon. There is no bowel dilatation or air-fluid level to suggest bowel obstruction. No free air. Central catheter tip is at the cavoatrial junction. Lung bases are clear. IMPRESSION: No bowel obstruction or free air evident. Enteric tube tip in distal stomach. Lung bases clear. Electronically Signed   By: Lowella Grip III M.D.   On: 06/16/2020 13:26   DG Swallowing Func-Speech Pathology  Result Date: 06/15/2020 Objective Swallowing Evaluation: Type of Study: MBS-Modified Barium Swallow Study  Patient Details Name: ANNINA PIOTROWSKI MRN: [id number] Date of Birth: 1970-05-10 Today's Date: 06/15/2020 Time: SLP Start Time (  ACUTE ONLY): 1439 -SLP Stop Time (ACUTE ONLY): 1500 SLP Time Calculation (min) (ACUTE ONLY): 21 min Past Medical History: Past Medical History: Diagnosis Date . Anxiety  . Arthritis   "joints ache all over" (10/15/2014) . Bulging lumbar disc  . Chronic lower back pain  . DDD (degenerative disc disease), cervical  . Depression  . Drug-seeking behavior  . Headache   "weekly" (10/15/2014) . Hyperlipemia  . Hypertension  . PTSD (post-traumatic stress disorder)  . Skin cancer   "had them cut off my arms; don't know what kind" Past Surgical History: Past Surgical History: Procedure Laterality Date . ABLATION ON ENDOMETRIOSIS  2008 . BIOPSY  12/27/2018  Procedure: BIOPSY;  Surgeon: Thornton Park, MD;  Location: [facility];  Service: Gastroenterology;; . ESOPHAGOGASTRODUODENOSCOPY (EGD) WITH PROPOFOL N/A 12/27/2018  Procedure: ESOPHAGOGASTRODUODENOSCOPY (EGD) WITH PROPOFOL;  Surgeon: Thornton Park, MD;  Location: [facility];  Service: Gastroenterology;  Laterality: N/A; . HEMORRHOID SURGERY  ~ 2002 . IR FLUORO GUIDE CV LINE RIGHT  06/14/2020 . IR US GUIDE VASC ACCESS RIGHT  06/14/2020 . ORIF ANKLE FRACTURE Right 03/28/2020  Procedure: OPEN REDUCTION INTERNAL FIXATION (ORIF) RIGHT BIMALLEOLAR ANKLE  FRACTURE;  Surgeon: Marchia Bond, MD;  Location: [place];  Service: Orthopedics;  Laterality: Right; HPI: [name] who apparently fell out of bed during the night and was found to be in asystole, and she was intubated and underwent chest compressions as well as EPI with ROSC.  Pt was admitted for COVID-19 in mid January.  Pt was intubated from 2/16 - 2/20.  Subjective: Pt was alert and mildly confused Assessment / Plan / Recommendation CHL IP CLINICAL IMPRESSIONS 06/15/2020 Clinical Impression Pt presents with oropharyngeal dysphagia characterized by decreased bolus cohesion and a pharyngeal delay. She demonstrated premature spillage to the valleculae and pyriform sinuses, penetration (PAS 3) of thin liquids via straw, and aspiration (PAS 7) of thin liquids when consecutive swallows were used.  Aspiration resulted in coughing, but this was ineffective in expelling the aspirated material. Throat clearing was intermittently noted while the fluoro was off and it is anticipated that at least some instances of penetration ultimately resulted in aspiration.The barium tablet became lodged in the valleculae and this appeared to be at least partly due to the [place]. A dysphagia 2 diet with nectar thick liquids will be initiated at this time. Pt has denied a history of dysphagia and it is anticipated that the pt's dysphagia is temporary secondary to prolonged intubation. SLP will follow to assess improvement in swallow function and for diet advancement. SLP Visit Diagnosis Dysphagia, oropharyngeal phase (R13.12) Attention and concentration deficit following -- Frontal lobe and executive function deficit following -- Impact on safety and function Mild aspiration risk   CHL IP TREATMENT RECOMMENDATION 06/15/2020 Treatment Recommendations Therapy as outlined in treatment plan below   Prognosis 06/15/2020 Prognosis for Safe Diet Advancement Good Barriers to Reach Goals -- Barriers/Prognosis  Comment -- CHL IP DIET RECOMMENDATION 06/15/2020 SLP Diet Recommendations Dysphagia 2 (Fine chop) solids;Nectar thick liquid Liquid Administration via Cup;Straw Medication Administration Whole meds with puree Compensations Slow rate;Small sips/bites Postural Changes Seated upright at 90 degrees   CHL IP OTHER RECOMMENDATIONS 06/15/2020 Recommended Consults -- Oral Care Recommendations Oral care BID Other Recommendations --   CHL IP FOLLOW UP RECOMMENDATIONS 06/15/2020 Follow up Recommendations Other (comment)   CHL IP FREQUENCY AND DURATION 06/15/2020 Speech Therapy Frequency (ACUTE ONLY) min 2x/week Treatment Duration 2 weeks      CHL IP ORAL PHASE  06/15/2020 Oral Phase Impaired Oral - Pudding Teaspoon -- Oral - Pudding Cup -- Oral - Honey Teaspoon -- Oral - Honey Cup -- Oral - Nectar Teaspoon -- Oral - Nectar Cup -- Oral - Nectar Straw Decreased bolus cohesion;Premature spillage Oral - Thin Teaspoon -- Oral - Thin Cup Decreased bolus cohesion;Premature spillage Oral - Thin Straw Decreased bolus cohesion;Premature spillage Oral - Puree WFL Oral - Mech Soft WFL Oral - Regular -- Oral - Multi-Consistency -- Oral - Pill -- Oral Phase - Comment --  CHL IP PHARYNGEAL PHASE 06/15/2020 Pharyngeal Phase Impaired Pharyngeal- Pudding Teaspoon -- Pharyngeal -- Pharyngeal- Pudding Cup -- Pharyngeal -- Pharyngeal- Honey Teaspoon -- Pharyngeal -- Pharyngeal- Honey Cup -- Pharyngeal -- Pharyngeal- Nectar Teaspoon -- Pharyngeal -- Pharyngeal- Nectar Cup -- Pharyngeal -- Pharyngeal- Nectar Straw Delayed swallow initiation-vallecula Pharyngeal -- Pharyngeal- Thin Teaspoon -- Pharyngeal -- Pharyngeal- Thin Cup Delayed swallow initiation-vallecula;Delayed swallow initiation-pyriform sinuses Pharyngeal -- Pharyngeal- Thin Straw Delayed swallow initiation-vallecula;Delayed swallow initiation-pyriform sinuses;Penetration/Aspiration during swallow;Penetration/Apiration after swallow Pharyngeal Material enters airway, remains ABOVE vocal cords  and not ejected out;Material enters airway, passes BELOW cords and not ejected out despite cough attempt by patient Pharyngeal- Puree Delayed swallow initiation-vallecula Pharyngeal -- Pharyngeal- Mechanical Soft Delayed swallow initiation-vallecula Pharyngeal -- Pharyngeal- Regular -- Pharyngeal -- Pharyngeal- Multi-consistency -- Pharyngeal -- Pharyngeal- Pill Delayed swallow initiation-vallecula;Pharyngeal residue - valleculae Pharyngeal -- Pharyngeal Comment --  CHL IP CERVICAL ESOPHAGEAL PHASE 06/15/2020 Cervical Esophageal Phase WFL Pudding Teaspoon -- Pudding Cup -- Honey Teaspoon -- Honey Cup -- Nectar Teaspoon -- Nectar Cup -- Nectar Straw -- Thin Teaspoon -- Thin Cup -- Thin Straw -- Puree -- Mechanical Soft -- Regular -- Multi-consistency -- Pill -- Cervical Esophageal Comment -- Shanika I. Hardin Negus, Amanda Park, [place] Office number [phone] Pager [place] 06/15/2020, 3:35 PM               Active Problems:   Cardiac arrest [facility])   Acute respiratory failure with hypoxia ([place])   Non-traumatic rhabdomyolysis   Hematochezia   Acute blood loss anemia     LOS: 9 days   Tye Savoy ,NP 06/17/2020, 8:42 AM

## 2020-06-17 NOTE — Progress Notes (Signed)
Occupational Therapy Treatment Patient Details Name: [name] MRN: [id number] DOB: 1970/10/24 Today's Date: 06/17/2020    History of present illness 50 yo 2/16 after fell out of bed and family checked on her later and found unresponsive s/p arrest and CPR with intubation. ?overdose suspected. CRRT 2/16-2/20. Extubated 2/20. PMhx: HTN, HLD, anxiety/depression, PTSD, Covid + 1/18, Rt ankle fx s/p ORIF, endometriosis   OT comments  Making steady progress toward goals. Able to complete transfer to [facility] then ambulate @ room to recliner with min A @ RW level of RA wth SpO2 > 92; BP 113/72; Max HR 127. Flat affect and apparent deficits with attention and higher level executive functioning skills in addition to generalized weakness. At this time feel CIR remains appropriate for rehab. Will continue to follow acutely.   Follow Up Recommendations  CIR;Supervision/Assistance - 24 hour    Equipment Recommendations  3 in 1 bedside commode    Recommendations for Other Services      Precautions / Restrictions Precautions Precautions: Fall;Other (comment) Precaution Comments: cortrak Restrictions Weight Bearing Restrictions: No       Mobility Bed Mobility Overal bed mobility: Needs Assistance       Supine to sit: Min guard;HOB elevated          Transfers Overall transfer level: Needs assistance Equipment used: Rolling walker (2 wheeled) Transfers: Sit to/from Stand Sit to Stand: Min guard              Balance Overall balance assessment: Needs assistance   Sitting balance-Leahy Scale: Fair       Standing balance-Leahy Scale: Poor Standing balance comment: bil UE support with RW                           ADL either performed or assessed with clinical judgement   ADL Overall ADL's : Needs assistance/impaired Eating/Feeding: Sitting Eating/Feeding Details (indicate cue type and reason): modified; thickened liquids; pt asking for water Grooming: Set  up;Supervision/safety;Sitting   Upper Body Bathing: Set up;Supervision/ safety;Sitting   Lower Body Bathing: Moderate assistance;Sit to/from stand   Upper Body Dressing : Minimal assistance;Sitting   Lower Body Dressing: Moderate assistance;Sit to/from stand   Toilet Transfer: Minimal assistance;RW;BSC   Toileting- Clothing Manipulation and Hygiene: Minimal assistance;Sit to/from stand;Sitting/lateral lean       Functional mobility during ADLs: Minimal assistance;Rolling walker;Cueing for safety       Vision   Additional Comments: will further assess   Perception     Praxis      Cognition Arousal/Alertness: Awake/alert Behavior During Therapy: Flat affect Overall Cognitive Status: Impaired/Different from baseline Area of Impairment: Orientation;Attention;Memory;Following commands;Safety/judgement;Awareness;Problem solving                 Orientation Level: Disoriented to;Time Current Attention Level: Selective Memory: Decreased short-term memory Following Commands: Follows one step commands consistently Safety/Judgement: Decreased awareness of safety;Decreased awareness of deficits Awareness: Emergent Problem Solving: Slow processing;Decreased initiation;Difficulty sequencing;Requires verbal cues General Comments: Aware of month but does not know if it is the beginning or end of Febuary; very slow processing  However demonstrated increased awareness when tele wire caught on bed and preventing pt from moving - pt needed assistance to problem solve need to step back to release tension to reposition wire      Exercises Exercises: Other exercises Other Exercises Other Exercises: encouraged level 1 general UE strengthening Other Exercises: asked nsg to get her an incentive spirometer - has orders Other  Exercises: marching chair level x 20   Shoulder Instructions       General Comments Pt enjoys reading books about history    Pertinent Vitals/ Pain        Pain Assessment: 0-10 Faces Pain Scale: Hurts little more Pain Location: stomach Pain Descriptors / Indicators: Grimacing;Discomfort Pain Intervention(s): Limited activity within patient's tolerance  Home Living                                          Prior Functioning/Environment              Frequency  Min 2X/week        Progress Toward Goals  OT Goals(current goals can now be found in the care plan section)  Progress towards OT goals: Progressing toward goals  Acute Rehab OT Goals Patient Stated Goal: return home OT Goal Formulation: With patient Time For Goal Achievement: 06/27/20 Potential to Achieve Goals: Good ADL Goals Pt Will Perform Grooming: with set-up;sitting Pt Will Perform Lower Body Bathing: with set-up;with supervision;sit to/from stand Pt Will Perform Lower Body Dressing: with min guard assist;sitting/lateral leans;sit to/from stand Pt Will Transfer to Toilet: with min guard assist;stand pivot transfer;bedside commode Pt Will Perform Toileting - Clothing Manipulation and hygiene: with supervision;sitting/lateral leans;with set-up Additional ADL Goal #1: Pt will participate in x10 mins of OOB ADL tasks in minimally distracting environment with minimal cues to sequence through tasks, Additional ADL Goal #2: Pt participate in cognitive assessment and higher level cognitive assessment in order to continue to assess safety with ADL/mobility. Additional ADL Goal #3: Pt will demosntrate anticipatory awareness during ADL task in minimally distracting environment  Plan Discharge plan remains appropriate    Co-evaluation                 AM-PAC OT "6 Clicks" Daily Activity     Outcome Measure   Help from another person eating meals?: A Lot Help from another person taking care of personal grooming?: A Little Help from another person toileting, which includes using toliet, bedpan, or urinal?: A Little Help from another person bathing  (including washing, rinsing, drying)?: A Lot Help from another person to put on and taking off regular upper body clothing?: A Little Help from another person to put on and taking off regular lower body clothing?: A Lot 6 Click Score: 15    End of Session Equipment Utilized During Treatment: Gait belt;Rolling walker  OT Visit Diagnosis: Unsteadiness on feet (R26.81);Muscle weakness (generalized) (M62.81);Other symptoms and signs involving cognitive function   Activity Tolerance Patient tolerated treatment well   Patient Left in chair;with call bell/phone within reach;with chair alarm set (asked nsg to get chair alarm box)   Nurse Communication Mobility status;Other (comment) (need for alarm box)        Time: 7793-9030 OT Time Calculation (min): 37 min  Charges: OT General Charges $OT Visit: 1 Visit OT Treatments $Self Care/Home Management : 23-37 mins  Maurie Boettcher, OT/L   Acute OT Clinical Specialist [place] Pager [phone] Office [phone]    [facility] 06/17/2020, 11:41 AM

## 2020-06-17 NOTE — Procedures (Signed)
Seen and examined on dialysis.  Procedure supervised.  RIJ tunn catheter.  Blood pressure 111/72 and HR 97.  Tolerating goal.    Claudia Desanctis, MD 06/17/2020 2:58 PM

## 2020-06-17 NOTE — Progress Notes (Signed)
[facility] ROUNDING NOTE   Subjective:   She's still in ICU.  One unmeasured urine void.  Had first HD on 2/23 with no UF.  No labs yet.  We discussed kidneys and that labs over the next few days would give [place] more information about how long she will need dialysis. 115/79   Review of systems:   Denies shortness of breath  Denies chest pain  Denies n/v or diarrhea   ------------------ Brief History: 50 y.o. female. With a significant history of hypertension, endometriosis and depression that was seen in the IR 05/10/20 for abdominal pain and was found to have Covid 19 infection   She was mildly symptomatic and was discharged home on 05/12/20. She apparently fell out of bed during the night and was found to be in asystole and she was intubated and underwent chest compressions as well as EPI with ROSC. Thought that cardiac arrest was related to opioid overdose. AKI now requiring CRRT starting 2/16 likely 2/2 ATN.   In the ER, she was seen by Dr Gwenlyn Found, Cardiology and 2 D Echo performed   She had a preserved ejection fraction and no wall motion abnormality. Her blood pressure was 58/33  Pulse was 87 and she was hypothermic at 92 degrees.   Objective:  Vital signs in last 24 hours:  Temp:  [97.9 F (36.6 C)-99.1 F (37.3 C)] 99.1 F (37.3 C) (02/25 0312) Pulse Rate:  [85-104] 103 (02/25 0000) Resp:  [14-20] 15 (02/25 0000) BP: (93-124)/(58-73) 93/68 (02/25 0000) SpO2:  [93 %-97 %] 95 % (02/25 0000) Weight:  [92.1 kg] 92.1 kg (02/25 0347)  Weight change: 3.1 kg Filed Weights   06/15/20 1909 06/16/20 0415 06/17/20 0347  Weight: 89 kg 90.4 kg 92.1 kg    Intake/Output: I/O last 3 completed shifts: In: 2025 [P.O.:120; Blood:335; NG/GT:1570] Out: -    Intake/Output this shift:  No intake/output data recorded.  General adult female in bed in no acute distress HEENT normocephalic atraumatic extraocular movements intact sclera anicteric Neck supple trachea midline Lungs  clear to auscultation bilaterally normal work of breathing at rest on nasal cannula 2.5 liters Heart S1S2 no rub Abdomen soft nontender nondistended; obese habitus Extremities trace edema; warm and perfused Neuro - more awake and interactive today Psych no anxiety or agitation  Access RIJ tunneled catheter  Basic Metabolic Panel: Recent Labs  Lab 06/11/20 1630 06/12/20 0258 06/12/20 1714 06/13/20 0328 06/13/20 1506 06/14/20 0423 06/15/20 0414 06/16/20 0423  NA 136 134*   < > 136 136 137 136 133*  K 4.1 4.0   < > 4.0 4.3 4.2 5.1 4.0  CL 102 101   < > 101 100 98 96* 95*  CO2 23 23   < > 26 23 24 22 25   GLUCOSE 166* 232*   < > 186* 232* 189* 205* 194*  BUN 10 13   < > 20 36* 58* 90* 38*  CREATININE 1.48* 1.48*   < > 1.38* 2.29* 3.34* 4.93* 2.76*  CALCIUM 7.8* 7.6*   < > 7.7* 7.7* 7.8* 8.2* 8.3*  MG 2.4 2.2  --  2.1  --  2.1 2.2  --   PHOS 1.0* 1.5*   < > 1.8* 3.7 4.1 4.4 3.1   < > = values in this interval not displayed.    Liver Function Tests: Recent Labs  Lab 06/13/20 0328 06/13/20 1506 06/14/20 0423 06/15/20 0414 06/16/20 0423  AST  --   --   --   --  40  ALT  --   --   --   --  42  ALKPHOS  --   --   --   --  96  BILITOT  --   --   --   --  0.7  PROT  --   --   --   --  5.7*  ALBUMIN 2.1* 2.2* 2.0* 1.9* 2.3*   CBC: Recent Labs  Lab 06/11/20 0306 06/12/20 0258 06/13/20 0328 06/15/20 0414 06/16/20 0423 06/16/20 1401 06/16/20 2143  WBC 13.5* 10.1 8.8 9.1 7.7  --   --   HGB 9.8* 9.3* 9.5* 7.4* 7.1* 7.0* 8.7*  HCT 30.1* 29.0* 30.3* 22.8* 22.5* 21.9* 26.3*  MCV 94.4 94.8 97.7 96.6 97.8  --   --   PLT 138* 112* 103* 152 189  --   --     Microbiology: Results for orders placed or performed during the hospital encounter of 06/08/20  MRSA PCR Screening     Status: Abnormal   Collection Time: 06/08/20  8:44 PM   Specimen: Nasopharyngeal  Result Value Ref Range Status   MRSA by PCR POSITIVE (A) NEGATIVE Final    Comment:        The GeneXpert MRSA Assay  (FDA approved for NASAL specimens only), is one component of a comprehensive MRSA colonization surveillance program. It is not intended to diagnose MRSA infection nor to guide or monitor treatment for MRSA infections. RESULT CALLED TO, READ BACK BY AND VERIFIED WITH: Ferrel Logan 06/08/20 2359 JDW Performed at [facility], [address], [place], [place] [address]   Blood culture (routine x 2)     Status: None   Collection Time: 06/08/20  9:25 PM   Specimen: BLOOD  Result Value Ref Range Status   Specimen Description BLOOD HEMODIALYSIS CATHETER  Final   Special Requests   Final    BOTTLES DRAWN AEROBIC AND ANAEROBIC Blood Culture results may not be optimal due to an excessive volume of blood received in culture bottles   Culture   Final    NO GROWTH 5 DAYS Performed at [facility], [place] [address] [place], [place] [address]    Report Status 06/13/2020 FINAL  Final  Blood culture (routine x 2)     Status: None   Collection Time: 06/08/20  9:25 PM   Specimen: BLOOD LEFT WRIST  Result Value Ref Range Status   Specimen Description BLOOD LEFT WRIST  Final   Special Requests   Final    BOTTLES DRAWN AEROBIC ONLY Blood Culture adequate volume   Culture   Final    NO GROWTH 5 DAYS Performed at [facility], [place] [address] [place],  [address]    Report Status 06/14/2020 FINAL  Final    Imaging: DG Abd Portable 1V  Result Date: 06/16/2020 CLINICAL DATA:  Abdominal pain EXAM: PORTABLE ABDOMEN - 1 VIEW COMPARISON:  May 11, 2020 FINDINGS: Enteric tube tip is in the distal stomach. There is contrast in the stomach. There also foci of contrast in the distal colon. There is no bowel dilatation or air-fluid level to suggest bowel obstruction. No free air. Central catheter tip is at the cavoatrial junction. Lung bases are clear. IMPRESSION: No bowel obstruction or free air evident. Enteric tube tip in distal stomach. Lung bases clear. Electronically Signed   By:  Lowella Grip III M.D.   On: 06/16/2020 13:26   DG Swallowing Func-Speech Pathology  Result Date: 06/15/2020 Objective  Swallowing Evaluation: Type of Study: MBS-Modified Barium Swallow Study  Patient Details Name: KAISLYN GULAS MRN: [id number] Date of Birth: 1970-08-23 Today's Date: 06/15/2020 Time: SLP Start Time (ACUTE ONLY): 1439 -SLP Stop Time (ACUTE ONLY): 1500 SLP Time Calculation (min) (ACUTE ONLY): 21 min Past Medical History: Past Medical History: Diagnosis Date . Anxiety  . Arthritis   "joints ache all over" (10/15/2014) . Bulging lumbar disc  . Chronic lower back pain  . DDD (degenerative disc disease), cervical  . Depression  . Drug-seeking behavior  . Headache   "weekly" (10/15/2014) . Hyperlipemia  . Hypertension  . PTSD (post-traumatic stress disorder)  . Skin cancer   "had them cut off my arms; don't know what kind" Past Surgical History: Past Surgical History: Procedure Laterality Date . ABLATION ON ENDOMETRIOSIS  2008 . BIOPSY  12/27/2018  Procedure: BIOPSY;  Surgeon: Thornton Park, MD;  Location: [facility];  Service: Gastroenterology;; . ESOPHAGOGASTRODUODENOSCOPY (EGD) WITH PROPOFOL N/A 12/27/2018  Procedure: ESOPHAGOGASTRODUODENOSCOPY (EGD) WITH PROPOFOL;  Surgeon: Thornton Park, MD;  Location: [facility];  Service: Gastroenterology;  Laterality: N/A; . HEMORRHOID SURGERY  ~ 2002 . IR FLUORO GUIDE CV LINE RIGHT  06/14/2020 . IR US GUIDE VASC ACCESS RIGHT  06/14/2020 . ORIF ANKLE FRACTURE Right 03/28/2020  Procedure: OPEN REDUCTION INTERNAL FIXATION (ORIF) RIGHT BIMALLEOLAR ANKLE FRACTURE;  Surgeon: Marchia Bond, MD;  Location: [facility];  Service: Orthopedics;  Laterality: Right; HPI: [name] who apparently fell out of bed during the night and was found to be in asystole, and she was intubated and underwent chest compressions as well as EPI with ROSC.  Pt was admitted for COVID-19 in mid January.  Pt was intubated from 2/16 - 2/20.  Subjective: Pt was  alert and mildly confused Assessment / Plan / Recommendation CHL IP CLINICAL IMPRESSIONS 06/15/2020 Clinical Impression Pt presents with oropharyngeal dysphagia characterized by decreased bolus cohesion and a pharyngeal delay. She demonstrated premature spillage to the valleculae and pyriform sinuses, penetration (PAS 3) of thin liquids via straw, and aspiration (PAS 7) of thin liquids when consecutive swallows were used.  Aspiration resulted in coughing, but this was ineffective in expelling the aspirated material. Throat clearing was intermittently noted while the fluoro was off and it is anticipated that at least some instances of penetration ultimately resulted in aspiration.The barium tablet became lodged in the valleculae and this appeared to be at least partly due to the [place]. A dysphagia 2 diet with nectar thick liquids will be initiated at this time. Pt has denied a history of dysphagia and it is anticipated that the pt's dysphagia is temporary secondary to prolonged intubation. SLP will follow to assess improvement in swallow function and for diet advancement. SLP Visit Diagnosis Dysphagia, oropharyngeal phase (R13.12) Attention and concentration deficit following -- Frontal lobe and executive function deficit following -- Impact on safety and function Mild aspiration risk   CHL IP TREATMENT RECOMMENDATION 06/15/2020 Treatment Recommendations Therapy as outlined in treatment plan below   Prognosis 06/15/2020 Prognosis for Safe Diet Advancement Good Barriers to Reach Goals -- Barriers/Prognosis Comment -- CHL IP DIET RECOMMENDATION 06/15/2020 SLP Diet Recommendations Dysphagia 2 (Fine chop) solids;Nectar thick liquid Liquid Administration via Cup;Straw Medication Administration Whole meds with puree Compensations Slow rate;Small sips/bites Postural Changes Seated upright at 90 degrees   CHL IP OTHER RECOMMENDATIONS 06/15/2020 Recommended Consults -- Oral Care Recommendations Oral care BID Other  Recommendations --   CHL IP FOLLOW UP RECOMMENDATIONS 06/15/2020 Follow up Recommendations Other (  comment)   CHL IP FREQUENCY AND DURATION 06/15/2020 Speech Therapy Frequency (ACUTE ONLY) min 2x/week Treatment Duration 2 weeks      CHL IP ORAL PHASE 06/15/2020 Oral Phase Impaired Oral - Pudding Teaspoon -- Oral - Pudding Cup -- Oral - Honey Teaspoon -- Oral - Honey Cup -- Oral - Nectar Teaspoon -- Oral - Nectar Cup -- Oral - Nectar Straw Decreased bolus cohesion;Premature spillage Oral - Thin Teaspoon -- Oral - Thin Cup Decreased bolus cohesion;Premature spillage Oral - Thin Straw Decreased bolus cohesion;Premature spillage Oral - Puree WFL Oral - Mech Soft WFL Oral - Regular -- Oral - Multi-Consistency -- Oral - Pill -- Oral Phase - Comment --  CHL IP PHARYNGEAL PHASE 06/15/2020 Pharyngeal Phase Impaired Pharyngeal- Pudding Teaspoon -- Pharyngeal -- Pharyngeal- Pudding Cup -- Pharyngeal -- Pharyngeal- Honey Teaspoon -- Pharyngeal -- Pharyngeal- Honey Cup -- Pharyngeal -- Pharyngeal- Nectar Teaspoon -- Pharyngeal -- Pharyngeal- Nectar Cup -- Pharyngeal -- Pharyngeal- Nectar Straw Delayed swallow initiation-vallecula Pharyngeal -- Pharyngeal- Thin Teaspoon -- Pharyngeal -- Pharyngeal- Thin Cup Delayed swallow initiation-vallecula;Delayed swallow initiation-pyriform sinuses Pharyngeal -- Pharyngeal- Thin Straw Delayed swallow initiation-vallecula;Delayed swallow initiation-pyriform sinuses;Penetration/Aspiration during swallow;Penetration/Apiration after swallow Pharyngeal Material enters airway, remains ABOVE vocal cords and not ejected out;Material enters airway, passes BELOW cords and not ejected out despite cough attempt by patient Pharyngeal- Puree Delayed swallow initiation-vallecula Pharyngeal -- Pharyngeal- Mechanical Soft Delayed swallow initiation-vallecula Pharyngeal -- Pharyngeal- Regular -- Pharyngeal -- Pharyngeal- Multi-consistency -- Pharyngeal -- Pharyngeal- Pill Delayed swallow  initiation-vallecula;Pharyngeal residue - valleculae Pharyngeal -- Pharyngeal Comment --  CHL IP CERVICAL ESOPHAGEAL PHASE 06/15/2020 Cervical Esophageal Phase WFL Pudding Teaspoon -- Pudding Cup -- Honey Teaspoon -- Honey Cup -- Nectar Teaspoon -- Nectar Cup -- Nectar Straw -- Thin Teaspoon -- Thin Cup -- Thin Straw -- Puree -- Mechanical Soft -- Regular -- Multi-consistency -- Pill -- Cervical Esophageal Comment -- Shanika I. Hardin Negus, [place], [place] Office number [phone] Pager [place] 06/15/2020, 3:35 PM                Medications:   . sodium chloride Stopped (06/15/20 1409)   . sodium chloride   Intravenous Once  . ARIPiprazole  5 mg Per Tube Daily  . chlorhexidine  15 mL Mouth Rinse BID  . Chlorhexidine Gluconate Cloth  6 each Topical Daily  . Chlorhexidine Gluconate Cloth  6 each Topical Q0600  . Chlorhexidine Gluconate Cloth  6 each Topical Q0600  . darbepoetin (ARANESP) injection - NON-DIALYSIS  40 mcg Subcutaneous Q Thu-1800  . dicyclomine  10 mg Oral TID AC  . feeding supplement  237 mL Oral BID BM  . feeding supplement (PROSource TF)  90 mL Per Tube BID  . feeding supplement (VITAL 1.5 CAL)  720 mL Per Tube Q24H  . FLUoxetine  40 mg Per Tube Daily  . hydrocortisone  25 mg Rectal BID  . insulin aspart  0-9 Units Subcutaneous Q4H  . mouth rinse  15 mL Mouth Rinse q12n4p  . midodrine  10 mg Per Tube TID WC  . multivitamin  1 tablet Oral QHS  . pantoprazole sodium  40 mg Per Tube Q24H  . sodium chloride flush  10-40 mL Intracatheter Q12H   acetaminophen, docusate, fentaNYL (SUBLIMAZE) injection, polyethylene glycol, Resource ThickenUp Clear, sodium chloride flush  Assessment/ Plan:  1.Acute kidney Injury:   Severe AKI with creatinine of 25 on arrival likely ATN.  Normal Cr at baseline. No signs of hydronephrosis or glomerular disease.  Mild elevation in CPK.   CRRT stopped on 2/21 after catheter malfunction and tunneled  catheter with IR on 2/22 and had HD on 2/23.   - HD today and assess needs daily.  Tentative MWF schedule for HD.  [place] for HD in dialysis unit  2. Hypotension.  Pressors now off.  On midodrine 10 mg TID  3. Acute hypoxic resp failure - recent mild covid.  Off vent on supp oxygen    4. Encephalopathy - CT head and drug screen and airway management per CCM.  improved  5. Hypophosphatemia: resolved was 2/2 CRRT.  Follow for need for binder if rises  6. Hyperkalemia   resolved with RRT  7. Normocytic anemia - secondary in part to CKD. aranesp 40 mcg on thursdays starting 2/24. Iron panel ok  Claudia Desanctis, MD 06/17/2020 7:39 AM

## 2020-06-17 NOTE — Progress Notes (Addendum)
  Speech Language Pathology Treatment: Dysphagia  Patient Details Name: [name] MRN: [id number] DOB: [birth year] Today's Date: 06/17/2020 Time: 4827-0786 SLP Time Calculation (min) (ACUTE ONLY): 8 min  Assessment / Plan / Recommendation Clinical Impression  Pt alert, upright, reports poor appetite, Has not touched breakfast and reports it is not because the texture of foods is unappealing. Offered upgraded texture of solids, but pt refused and stated she prefers soft foods. Pt is interested in thin water. When observed after a verbal cue to take small sips pt attempted small sips but also drank continuously at times. Intermittent throat clearing observed, vocal quality dry no coughing. Recommend pt continue nectar thick liquids on tray, except for thin water with instruction to take small sips, no straws. Pt advised not to drink any other thin liquids yet such as soda or juice. Hopeful that clinical signs of dysphagia will not be present by Monday and pts liquid texture can be fully upgraded by SLP. If she ever requests a return to regular solid foods, this can be changed per her preference.   HPI HPI: [name] who apparently fell out of bed during the night and was found to be in asystole, and she was intubated and underwent chest compressions as well as EPI with ROSC.  Pt was admitted for COVID-19 in mid January.  Pt was intubated from 2/16 - 2/20.      SLP Plan  Continue with current plan of care       Recommendations  Diet recommendations: Dysphagia 2 (fine chop);Thin liquid;Nectar-thick liquid Liquids provided via: Cup Medication Administration: Whole meds with puree Supervision: Patient able to self feed;Full supervision/cueing for compensatory strategies Compensations: Slow rate;Small sips/bites;Minimize environmental distractions Postural Changes and/or Swallow Maneuvers: Seated upright 90 degrees;Upright 30-60 min after meal                Oral Care  Recommendations: Oral care BID;Oral care prior to ice chip/H20 Follow up Recommendations: Inpatient Rehab SLP Visit Diagnosis: Dysphagia, oropharyngeal phase (R13.12) Plan: Continue with current plan of care       GO               Tracy [place], [place] Pager [phone] Office [phone]  [name] 06/17/2020, 9:44 AM

## 2020-06-18 DIAGNOSIS — R739 Hyperglycemia, unspecified: Secondary | ICD-10-CM | POA: Diagnosis not present

## 2020-06-18 DIAGNOSIS — J9601 Acute respiratory failure with hypoxia: Secondary | ICD-10-CM | POA: Diagnosis not present

## 2020-06-18 DIAGNOSIS — E871 Hypo-osmolality and hyponatremia: Secondary | ICD-10-CM | POA: Diagnosis not present

## 2020-06-18 DIAGNOSIS — I469 Cardiac arrest, cause unspecified: Secondary | ICD-10-CM | POA: Diagnosis not present

## 2020-06-18 LAB — RENAL FUNCTION PANEL
Albumin: 2.2 g/dL — ABNORMAL LOW (ref 3.5–5.0)
Anion gap: 11 (ref 5–15)
BUN: 30 mg/dL — ABNORMAL HIGH (ref 6–20)
CO2: 25 mmol/L (ref 22–32)
Calcium: 8.4 mg/dL — ABNORMAL LOW (ref 8.9–10.3)
Chloride: 96 mmol/L — ABNORMAL LOW (ref 98–111)
Creatinine, Ser: 2.55 mg/dL — ABNORMAL HIGH (ref 0.44–1.00)
GFR, Estimated: 22 mL/min — ABNORMAL LOW (ref >60)
Glucose, Bld: 196 mg/dL — ABNORMAL HIGH (ref 70–99)
Phosphorus: 3.5 mg/dL (ref 2.5–4.6)
Potassium: 3.6 mmol/L (ref 3.5–5.1)
Sodium: 132 mmol/L — ABNORMAL LOW (ref 135–145)

## 2020-06-18 LAB — GLUCOSE, CAPILLARY
Glucose-Capillary: 214 mg/dL — ABNORMAL HIGH (ref 70–99)
Glucose-Capillary: 131 mg/dL — ABNORMAL HIGH (ref 70–99)
Glucose-Capillary: 119 mg/dL — ABNORMAL HIGH (ref 70–99)
Glucose-Capillary: 124 mg/dL — ABNORMAL HIGH (ref 70–99)
Glucose-Capillary: 190 mg/dL — ABNORMAL HIGH (ref 70–99)

## 2020-06-18 LAB — HEMOGLOBIN AND HEMATOCRIT, BLOOD
HCT: 25.8 % — ABNORMAL LOW (ref 36.0–46.0)
Hemoglobin: 8.4 g/dL — ABNORMAL LOW (ref 12.0–15.0)

## 2020-06-18 NOTE — Progress Notes (Signed)
[facility] ROUNDING NOTE   Subjective:   She's still in ICU and per nursing waiting on a bed.  No uop documented and she doesn't think she had any uop.  Last HD on 2/25 with 900 mL UF charted.  She's been off oxygen.   Review of systems:   Denies shortness of breath  Denies chest pain  Denies n/v or diarrhea Hasn't had much appetite  ------------------ Brief History: 50 y.o. female. With a significant history of hypertension, endometriosis and depression that was seen in the IR 05/10/20 for abdominal pain and was found to have Covid 19 infection   She was mildly symptomatic and was discharged home on 05/12/20. She apparently fell out of bed during the night and was found to be in asystole and she was intubated and underwent chest compressions as well as EPI with ROSC. Thought that cardiac arrest was related to opioid overdose. AKI now requiring CRRT starting 2/16 likely 2/2 ATN.   In the ER, she was seen by Dr Gwenlyn Found, Cardiology and 2 D Echo performed   She had a preserved ejection fraction and no wall motion abnormality. Her blood pressure was 58/33  Pulse was 87 and she was hypothermic at 92 degrees.   Objective:  Vital signs in last 24 hours:  Temp:  [97.6 F (36.4 C)-98.8 F (37.1 C)] 97.8 F (36.6 C) (02/26 0300) Pulse Rate:  [87-104] 93 (02/26 0600) Resp:  [13-24] 18 (02/26 0600) BP: (104-130)/(66-86) 128/76 (02/26 0600) SpO2:  [94 %-98 %] 94 % (02/26 0600) Weight:  [90 kg-92.4 kg] 92.4 kg (02/26 0315)  Weight change: -1.1 kg Filed Weights   06/17/20 1215 06/17/20 1530 06/18/20 0315  Weight: 91 kg 90 kg 92.4 kg    Intake/Output: I/O last 3 completed shifts: In: 1210 [Blood:310; NG/GT:900] Out: 900 [Other:900]   Intake/Output this shift:  No intake/output data recorded.  General adult female in bed in no acute distress  HEENT normocephalic atraumatic extraocular movements intact sclera anicteric Neck supple trachea midline Lungs clear to auscultation  bilaterally normal work of breathing at rest on room air Heart S1S2 no rub Abdomen soft nontender nondistended; obese habitus Extremities no edema; warm and perfused Neuro - more awake and interactive today Psych no anxiety or agitation  Access RIJ tunneled catheter  Basic Metabolic Panel: Recent Labs  Lab 06/12/20 0258 06/12/20 1714 06/13/20 0328 06/13/20 1506 06/14/20 0423 06/15/20 0414 06/16/20 0423 06/17/20 1015 06/18/20 0152  NA 134*   < > 136   < > 137 136 133* 130* 132*  K 4.0   < > 4.0   < > 4.2 5.1 4.0 4.3 3.6  CL 101   < > 101   < > 98 96* 95* 93* 96*  CO2 23   < > 26   < > 24 22 25 22 25   GLUCOSE 232*   < > 186*   < > 189* 205* 194* 141* 196*  BUN 13   < > 20   < > 58* 90* 38* 64* 30*  CREATININE 1.48*   < > 1.38*   < > 3.34* 4.93* 2.76* 4.34* 2.55*  CALCIUM 7.6*   < > 7.7*   < > 7.8* 8.2* 8.3* 8.9 8.4*  MG 2.2  --  2.1  --  2.1 2.2  --  1.9  --   PHOS 1.5*   < > 1.8*   < > 4.1 4.4 3.1 4.1 3.5   < > = values in this  interval not displayed.    Liver Function Tests: Recent Labs  Lab 06/14/20 0423 06/15/20 0414 06/16/20 0423 06/17/20 1015 06/18/20 0152  AST  --   --  40  --   --   ALT  --   --  42  --   --   ALKPHOS  --   --  96  --   --   BILITOT  --   --  0.7  --   --   PROT  --   --  5.7*  --   --   ALBUMIN 2.0* 1.9* 2.3* 2.3* 2.2*   CBC: Recent Labs  Lab 06/12/20 0258 06/13/20 0328 06/15/20 0414 06/16/20 0423 06/16/20 1401 06/16/20 2143 06/17/20 1015 06/18/20 0201  WBC 10.1 8.8 9.1 7.7  --   --  10.3  --   HGB 9.3* 9.5* 7.4* 7.1* 7.0* 8.7* 9.1* 8.4*  HCT 29.0* 30.3* 22.8* 22.5* 21.9* 26.3* 27.7* 25.8*  MCV 94.8 97.7 96.6 97.8  --   --  92.0  --   PLT 112* 103* 152 189  --   --  295  --     Microbiology: Results for orders placed or performed during the hospital encounter of 06/08/20  MRSA PCR Screening     Status: Abnormal   Collection Time: 06/08/20  8:44 PM   Specimen: Nasopharyngeal  Result Value Ref Range Status   MRSA by PCR  POSITIVE (A) NEGATIVE Final    Comment:        The GeneXpert MRSA Assay (FDA approved for NASAL specimens only), is one component of a comprehensive MRSA colonization surveillance program. It is not intended to diagnose MRSA infection nor to guide or monitor treatment for MRSA infections. RESULT CALLED TO, READ BACK BY AND VERIFIED WITH: Ferrel Logan 06/08/20 2359 JDW Performed at [facility], [address] [place], [place] [address]   Blood culture (routine x 2)     Status: None   Collection Time: 06/08/20  9:25 PM   Specimen: BLOOD  Result Value Ref Range Status   Specimen Description BLOOD HEMODIALYSIS CATHETER  Final   Special Requests   Final    BOTTLES DRAWN AEROBIC AND ANAEROBIC Blood Culture results may not be optimal due to an excessive volume of blood received in culture bottles   Culture   Final    NO GROWTH 5 DAYS Performed at [facility], [place] [address], [place], [place] [address]    Report Status 06/13/2020 FINAL  Final  Blood culture (routine x 2)     Status: None   Collection Time: 06/08/20  9:25 PM   Specimen: BLOOD LEFT WRIST  Result Value Ref Range Status   Specimen Description BLOOD LEFT WRIST  Final   Special Requests   Final    BOTTLES DRAWN AEROBIC ONLY Blood Culture adequate volume   Culture   Final    NO GROWTH 5 DAYS Performed at [facility], [place] [address] [place], [place] [address]    Report Status 06/14/2020 FINAL  Final    Imaging: DG Abd Portable 1V  Result Date: 06/16/2020 CLINICAL DATA:  Abdominal pain EXAM: PORTABLE ABDOMEN - 1 VIEW COMPARISON:  May 11, 2020 FINDINGS: Enteric tube tip is in the distal stomach. There is contrast in the stomach. There also foci of contrast in the distal colon. There is no bowel dilatation or air-fluid level to suggest bowel obstruction. No free air. Central catheter tip is at the  cavoatrial junction. Lung bases are clear. IMPRESSION: No bowel obstruction or free air evident. Enteric  tube tip in distal stomach. Lung bases clear. Electronically Signed   By: Lowella Grip III M.D.   On: 06/16/2020 13:26     Medications:   . sodium chloride Stopped (06/15/20 1409)   . sodium chloride   Intravenous Once  . ARIPiprazole  5 mg Per Tube Daily  . chlorhexidine  15 mL Mouth Rinse BID  . Chlorhexidine Gluconate Cloth  6 each Topical Q0600  . darbepoetin (ARANESP) injection - NON-DIALYSIS  40 mcg Subcutaneous Q Thu-1800  . dicyclomine  10 mg Per Tube TID AC  . feeding supplement  237 mL Oral BID BM  . feeding supplement (PROSource TF)  90 mL Per Tube BID  . feeding supplement (VITAL 1.5 CAL)  720 mL Per Tube Q24H  . FLUoxetine  40 mg Per Tube Daily  . hydrocortisone  25 mg Rectal BID  . insulin aspart  0-9 Units Subcutaneous Q4H  . mouth rinse  15 mL Mouth Rinse q12n4p  . midodrine  10 mg Per Tube TID WC  . multivitamin  1 tablet Per Tube QHS  . pantoprazole sodium  40 mg Per Tube Q24H  . sodium chloride flush  10-40 mL Intracatheter Q12H   acetaminophen, docusate, fentaNYL (SUBLIMAZE) injection, polyethylene glycol, Resource ThickenUp Clear, sodium chloride flush  Assessment/ Plan:  1.Acute kidney Injury:   Severe AKI with creatinine of 25 on arrival likely ATN.  Normal Cr at baseline. No signs of hydronephrosis or glomerular disease.  Mild elevation in CPK.   CRRT stopped on 2/21 after catheter malfunction and tunneled catheter with IR on 2/22 and had HD on 2/23.   - Plan for MWF schedule for HD for now.  [place] for HD in dialysis unit  2. Hypotension.  Pressors now off.  On midodrine 10 mg TID (started inpatient)  3. Acute hypoxic resp failure - recent mild covid.  Off vent and now off supp oxygen    4. Encephalopathy - CT head and drug screen and airway management per CCM.  Improved and more conversant  5. Hypophosphatemia: resolved was 2/2 CRRT.  Follow for need for binder if rises  6. Hyperkalemia   resolved with RRT  7. Normocytic anemia - secondary in part  to CKD. aranesp 40 mcg on thursdays starting 2/24. Iron panel ok  Claudia Desanctis, MD 06/18/2020 7:58 AM

## 2020-06-18 NOTE — Progress Notes (Signed)
PROGRESS NOTE  [name] JTT:[id number] DOB: 1970/07/09   PCP: Andreas Blower, MD  Patient is from: Home.  DOA: 06/08/2020 LOS: 38  Chief complaints: Cardiac arrest  Brief Narrative / Interim history: 50 year old F with PMH of PTSD, depression, anxiety, HTN, OA on chronic opiate and recent COVID-19 infection brought to ED due to cardiac arrest/asystole with successful ROSC with 3 minutes of CPR, epinephrine and [place] airway.  She was admitted to ICU for cardiac arrest/shock, acute hypoxemic respiratory failure, aspiration pneumonia, severe acidosis, AKI, hypothermia and hypokalemia.  She was intubated 2/16-2/20. Started on CRRT and transitioned to IHD on 06/15/20.  Received IV Zosyn from 2/16-2/22. Eventually transferred to [facility] service on 2/24 for further care.  Patient had drop in her hemoglobin.  She also had some hematochezia likely from hemorrhoid.  GI consulted.  She was transfused 1 unit with appropriate response, and H&H remained stable.  Therapy recommended CIR  Subjective: Seen and examined earlier this morning.  No major events overnight of this morning.  No complaints.  She denies chest pain, dyspnea, GI or focal neuro symptoms.  No urine output.  She has not a bowel movement in the last 24 hours.  Objective: Vitals:   06/18/20 1100 06/18/20 1200 06/18/20 1300 06/18/20 1308  BP:  138/68    Pulse: (!) 104 90 88   Resp: 20 12 18    Temp:    97.7 F (36.5 C)  TempSrc:    Oral  SpO2: 95% 92% 94%   Weight:      Height:        Intake/Output Summary (Last 24 hours) at 06/18/2020 1319 Last data filed at 06/18/2020 0800 Gross per 24 hour  Intake 590 ml  Output 900 ml  Net -310 ml   Filed Weights   06/17/20 1215 06/17/20 1530 06/18/20 0315  Weight: 91 kg 90 kg 92.4 kg    Examination:  GENERAL: No apparent distress.  Nontoxic. HEENT: MMM.  Vision and hearing grossly intact.  NECK: Supple.  No apparent JVD.  RESP: On RA.  No IWOB.  Fair aeration  bilaterally. CVS:  RRR. Heart sounds normal.  ABD/GI/GU: BS+. Abd soft, NTND.  MSK/EXT:  Moves extremities. No apparent deformity. No edema.  SKIN: no apparent skin lesion or wound NEURO: Awake, alert and oriented appropriately.  No apparent focal neuro deficit. PSYCH: Calm. Normal affect.  Procedures:  ETT 2/16 >> 2/20 Rt IJ HD cath 2/16 >> 2/22 Lt IJ CVL 2/16 >> 2/23  Rt femoral arterial line 2/18 >> 2/21 R Perm Cath (per IR) 2/22 >>  First IHD on 2/23  Microbiology summarized: BCx2 (HD cath) 2/16 >> negative  BCx2 2/16 >> negative  MRSA PCR 2/16 >> positive  Assessment & Plan: Acute hypoxic respiratory failure with compromised airway in setting of cardiac arrest and aspiration pneumonitis/pneumonia: Resolved.  Currently stable on room air. -Evaluated by cardiology, Dr. Gwenlyn Found in ED. TTE without significant finding. -IV Zosyn 2/16-2/22 -Intubated on 2/16 and extubated on 2/20.  -Continue incentive spirometry, OOB/PT/OT  Shock-likely combination of cardiac and septic: POA. UDS was positive for amphetamine on admission but she denies use.  She received epinephrine for resuscitation.  Not sure if this affects the results.  Treated for sepsis as above. TTE without significant finding.  BP stable on midodrine. -Continue midodrine 10 mg 3 times daily for now -Hold home antihypertensive meds.  Anuric AKI/azotemia: Likely ATN in the setting of shock, hypoxia and sepsis.  Received CRRT and transitioned to  IHD on 06/15/2020.   -Plan for HD MWF per nephrology.  Acute toxic and metabolic encephalopathy-due to the above and possible polypharmacy.  Resolved. -Reorientation and delirium precautions -Minimize or avoid sedating medications  Hx of depression, PTSD, anxiety and history of prior suicide attempts.  Reportedly, she has been in bed since November 2021.  She denies suicidal ideation or attempt. -Psych started Abilify and Prozac, recommended outpatient f/up and outpatient substance  abuse program -Outpatient psych follow-up and outpatient substance abuse program -Holding home Lamictal, risperidone, Zanaflex, trazodone and Xanax.  No evidence of withdrawal  Substance use: UDS positive for amphetamines "not prescribed" but she denies use.  -Outpatient substance abuse program  Hyponatremia: Likely due to renal failure.  Improved. -Monitor  Hypokalemia/hypophosphatemia-resolved.  Essential hypertension: Normotensive -Continue holding home lisinopril and HCTZ  Hyperglycemia/prediabetes: A1c 6.1% on 2/16. No results for input(s): HGBA1C in the last 72 hours. Recent Labs  Lab 06/17/20 1920 06/17/20 2318 06/18/20 0300 06/18/20 0805 06/18/20 1307  GLUCAP 167* 159* 214* 131* 119*  -Continue SSI-sensitive scale  Dysphagia-upgraded to dysphagia-2 diet by SLP -Already changing to nocturnal TF  Anemia of critical illness and possible ABLA: small hematochezia in stool the morning of 2/24. H&H stable after 1 unit.   Recent Labs    06/10/20 0405 06/11/20 0306 06/12/20 0258 06/13/20 0328 06/15/20 0414 06/16/20 0423 06/16/20 1401 06/16/20 2143 06/17/20 1015 06/18/20 0201  HGB 11.2* 9.8* 9.3* 9.5* 7.4* 7.1* 7.0* 8.7* 9.1* 8.4*  -Monitor H&H -Transfuse for Hgb less than 7 -May consider starting DVT prophylaxis in the morning if H&H stable -GI signed off.  Mild thrombocytopenia, likely from sepsis.  Resolved.  Deconditioning: Due to depression and acute illness. -See plan above -PT / OT-recommended CIR  Recent COVID-19 infection: Tested positive on 1/18.  Had a mild illness and did not require treatment or hospitalization. -Outside the window for isolation  Class I obesity Body mass index is 32.88 kg/m. Nutrition Problem: Inadequate oral intake Etiology: acute illness Signs/Symptoms: NPO status Interventions: Tube feeding   DVT prophylaxis:  Place and maintain sequential compression device Start: 06/16/20 1313 Place and maintain sequential  compression device Start: 06/08/20 1602 Will resume subcu heparin if H&H remains stable tomorrow.  Code Status: Full code Family Communication: Updated patient's husband at bedside on 2/25. Level of care: Telemetry Medical Status is: Inpatient  Remains inpatient appropriate because:Unsafe d/c plan and Inpatient level of care appropriate due to severity of illness   Dispo: The patient is from: Home              Anticipated d/c is to: CIR              Patient currently is not medically stable to d/c.   Difficult to place patient No       Consultants:  PCCM-signed off Nephrology-following Gastroenterology-signed off   Sch Meds:  Scheduled Meds: . sodium chloride   Intravenous Once  . ARIPiprazole  5 mg Per Tube Daily  . chlorhexidine  15 mL Mouth Rinse BID  . Chlorhexidine Gluconate Cloth  6 each Topical Q0600  . darbepoetin (ARANESP) injection - NON-DIALYSIS  40 mcg Subcutaneous Q Thu-1800  . dicyclomine  10 mg Per Tube TID AC  . feeding supplement  237 mL Oral BID BM  . feeding supplement (PROSource TF)  90 mL Per Tube BID  . feeding supplement (VITAL 1.5 CAL)  720 mL Per Tube Q24H  . FLUoxetine  40 mg Per Tube Daily  . hydrocortisone  25  mg Rectal BID  . insulin aspart  0-9 Units Subcutaneous Q4H  . mouth rinse  15 mL Mouth Rinse q12n4p  . midodrine  10 mg Per Tube TID WC  . multivitamin  1 tablet Per Tube QHS  . pantoprazole sodium  40 mg Per Tube Q24H  . sodium chloride flush  10-40 mL Intracatheter Q12H   Continuous Infusions: . sodium chloride Stopped (06/15/20 1409)   PRN Meds:.acetaminophen, docusate, fentaNYL (SUBLIMAZE) injection, polyethylene glycol, Resource ThickenUp Clear, sodium chloride flush  Antimicrobials: Anti-infectives (From admission, onward)   Start     Dose/Rate Route Frequency Ordered Stop   06/15/20 1500  piperacillin-tazobactam (ZOSYN) IVPB 2.25 g  Status:  Discontinued        2.25 g 100 mL/hr over 30 Minutes Intravenous Every 8 hours  06/15/20 0827 06/15/20 1002   06/13/20 1800  piperacillin-tazobactam (ZOSYN) IVPB 3.375 g  Status:  Discontinued        3.375 g 12.5 mL/hr over 240 Minutes Intravenous Every 12 hours 06/13/20 1114 06/15/20 0827   06/08/20 1800  piperacillin-tazobactam (ZOSYN) IVPB 3.375 g  Status:  Discontinued        3.375 g 100 mL/hr over 30 Minutes Intravenous Every 6 hours 06/08/20 1608 06/13/20 1114       I have personally reviewed the following labs and images: CBC: Recent Labs  Lab 06/12/20 0258 06/13/20 0328 06/15/20 0414 06/16/20 0423 06/16/20 1401 06/16/20 2143 06/17/20 1015 06/18/20 0201  WBC 10.1 8.8 9.1 7.7  --   --  10.3  --   HGB 9.3* 9.5* 7.4* 7.1* 7.0* 8.7* 9.1* 8.4*  HCT 29.0* 30.3* 22.8* 22.5* 21.9* 26.3* 27.7* 25.8*  MCV 94.8 97.7 96.6 97.8  --   --  92.0  --   PLT 112* 103* 152 189  --   --  295  --    BMP &GFR Recent Labs  Lab 06/12/20 0258 06/12/20 1714 06/13/20 0328 06/13/20 1506 06/14/20 0423 06/15/20 0414 06/16/20 0423 06/17/20 1015 06/18/20 0152  NA 134*   < > 136   < > 137 136 133* 130* 132*  K 4.0   < > 4.0   < > 4.2 5.1 4.0 4.3 3.6  CL 101   < > 101   < > 98 96* 95* 93* 96*  CO2 23   < > 26   < > 24 22 25 22 25   GLUCOSE 232*   < > 186*   < > 189* 205* 194* 141* 196*  BUN 13   < > 20   < > 58* 90* 38* 64* 30*  CREATININE 1.48*   < > 1.38*   < > 3.34* 4.93* 2.76* 4.34* 2.55*  CALCIUM 7.6*   < > 7.7*   < > 7.8* 8.2* 8.3* 8.9 8.4*  MG 2.2  --  2.1  --  2.1 2.2  --  1.9  --   PHOS 1.5*   < > 1.8*   < > 4.1 4.4 3.1 4.1 3.5   < > = values in this interval not displayed.   Estimated Creatinine Clearance: 30.5 mL/min (A) (by C-G formula based on SCr of 2.55 mg/dL (H)). Liver & Pancreas: Recent Labs  Lab 06/14/20 0423 06/15/20 0414 06/16/20 0423 06/17/20 1015 06/18/20 0152  AST  --   --  40  --   --   ALT  --   --  42  --   --   ALKPHOS  --   --  96  --   --   BILITOT  --   --  0.7  --   --   PROT  --   --  5.7*  --   --   ALBUMIN 2.0* 1.9* 2.3*  2.3* 2.2*   No results for input(s): LIPASE, AMYLASE in the last 168 hours. No results for input(s): AMMONIA in the last 168 hours. Diabetic: No results for input(s): HGBA1C in the last 72 hours. Recent Labs  Lab 06/17/20 1920 06/17/20 2318 06/18/20 0300 06/18/20 0805 06/18/20 1307  GLUCAP 167* 159* 214* 131* 119*   Cardiac Enzymes: No results for input(s): CKTOTAL, CKMB, CKMBINDEX, TROPONINI in the last 168 hours. No results for input(s): PROBNP in the last 8760 hours. Coagulation Profile: No results for input(s): INR, PROTIME in the last 168 hours. Thyroid Function Tests: No results for input(s): TSH, T4TOTAL, FREET4, T3FREE, THYROIDAB in the last 72 hours. Lipid Profile: No results for input(s): CHOL, HDL, LDLCALC, TRIG, CHOLHDL, LDLDIRECT in the last 72 hours. Anemia Panel: Recent Labs    06/16/20 1401  VITAMINB12 844  FOLATE 6.5  FERRITIN 347*  TIBC 193*  IRON 62  RETICCTPCT 3.6*   Urine analysis:    Component Value Date/Time   COLORURINE AMBER (A) 06/08/2020 1512   APPEARANCEUR CLOUDY (A) 06/08/2020 1512   LABSPEC 1.023 06/08/2020 1512   PHURINE 5.0 06/08/2020 1512   GLUCOSEU NEGATIVE 06/08/2020 1512   HGBUR NEGATIVE 06/08/2020 [place] 06/08/2020 [place] 06/08/2020 1512   PROTEINUR NEGATIVE 06/08/2020 1512   UROBILINOGEN 0.2 01/19/2015 0302   NITRITE NEGATIVE 06/08/2020 1512   LEUKOCYTESUR NEGATIVE 06/08/2020 1512   Sepsis Labs: Invalid input(s): PROCALCITONIN, [place]  Microbiology: Recent Results (from the past 240 hour(s))  MRSA PCR Screening     Status: Abnormal   Collection Time: 06/08/20  8:44 PM   Specimen: Nasopharyngeal  Result Value Ref Range Status   MRSA by PCR POSITIVE (A) NEGATIVE Final    Comment:        The GeneXpert MRSA Assay (FDA approved for NASAL specimens only), is one component of a comprehensive MRSA colonization surveillance program. It is not intended to diagnose  MRSA infection nor to guide or monitor treatment for MRSA infections. RESULT CALLED TO, READ BACK BY AND VERIFIED WITH: Ferrel Logan 06/08/20 2359 JDW Performed at [facility], [address], [place], [place] [address]   Blood culture (routine x 2)     Status: None   Collection Time: 06/08/20  9:25 PM   Specimen: BLOOD  Result Value Ref Range Status   Specimen Description BLOOD HEMODIALYSIS CATHETER  Final   Special Requests   Final    BOTTLES DRAWN AEROBIC AND ANAEROBIC Blood Culture results may not be optimal due to an excessive volume of blood received in culture bottles   Culture   Final    NO GROWTH 5 DAYS Performed at [facility], [place] [address], [place], [place] [address]    Report Status 06/13/2020 FINAL  Final  Blood culture (routine x 2)     Status: None   Collection Time: 06/08/20  9:25 PM   Specimen: BLOOD LEFT WRIST  Result Value Ref Range Status   Specimen Description BLOOD LEFT WRIST  Final   Special Requests   Final    BOTTLES DRAWN AEROBIC ONLY Blood Culture adequate volume   Culture   Final    NO GROWTH 5 DAYS Performed at [facility]  Lab, [address] [place], [place] [address]    Report Status 06/14/2020 FINAL  Final    Radiology Studies: No results found.   Shakela Donati T. [place]  If 7PM-7AM, please contact night-coverage [email or web address] 06/18/2020, 1:19 PM

## 2020-06-19 DIAGNOSIS — J9601 Acute respiratory failure with hypoxia: Secondary | ICD-10-CM | POA: Diagnosis not present

## 2020-06-19 LAB — RENAL FUNCTION PANEL
Albumin: 2.3 g/dL — ABNORMAL LOW (ref 3.5–5.0)
Anion gap: 13 (ref 5–15)
BUN: 49 mg/dL — ABNORMAL HIGH (ref 6–20)
CO2: 24 mmol/L (ref 22–32)
Calcium: 8.7 mg/dL — ABNORMAL LOW (ref 8.9–10.3)
Chloride: 97 mmol/L — ABNORMAL LOW (ref 98–111)
Creatinine, Ser: 3.6 mg/dL — ABNORMAL HIGH (ref 0.44–1.00)
GFR, Estimated: 15 mL/min — ABNORMAL LOW (ref >60)
Glucose, Bld: 183 mg/dL — ABNORMAL HIGH (ref 70–99)
Phosphorus: 5 mg/dL — ABNORMAL HIGH (ref 2.5–4.6)
Potassium: 3.7 mmol/L (ref 3.5–5.1)
Sodium: 134 mmol/L — ABNORMAL LOW (ref 135–145)

## 2020-06-19 LAB — GLUCOSE, CAPILLARY
Glucose-Capillary: 135 mg/dL — ABNORMAL HIGH (ref 70–99)
Glucose-Capillary: 160 mg/dL — ABNORMAL HIGH (ref 70–99)
Glucose-Capillary: 169 mg/dL — ABNORMAL HIGH (ref 70–99)
Glucose-Capillary: 170 mg/dL — ABNORMAL HIGH (ref 70–99)
Glucose-Capillary: 182 mg/dL — ABNORMAL HIGH (ref 70–99)
Glucose-Capillary: 197 mg/dL — ABNORMAL HIGH (ref 70–99)

## 2020-06-19 LAB — HEMOGLOBIN AND HEMATOCRIT, BLOOD
HCT: 26.9 % — ABNORMAL LOW (ref 36.0–46.0)
Hemoglobin: 8.7 g/dL — ABNORMAL LOW (ref 12.0–15.0)

## 2020-06-19 LAB — MAGNESIUM: Magnesium: 1.9 mg/dL (ref 1.7–2.4)

## 2020-06-19 MED ORDER — CHLORHEXIDINE GLUCONATE CLOTH 2 % EX PADS
6.0000 | MEDICATED_PAD | Freq: Every day | CUTANEOUS | Status: DC
  Administered 2020-06-20 – 2020-06-23 (×4): 6 via TOPICAL

## 2020-06-19 MED ORDER — HEPARIN SODIUM (PORCINE) 5000 UNIT/ML IJ SOLN
5000.0000 [IU] | Freq: Three times a day (TID) | INTRAMUSCULAR | Status: DC
  Administered 2020-06-19 – 2020-06-23 (×13): 5000 [IU] via SUBCUTANEOUS
  Filled 2020-06-19 (×13): qty 1

## 2020-06-19 NOTE — Progress Notes (Signed)
PROGRESS NOTE  [name] VVO:[id number] DOB: 08-Sep-1970   PCP: Andreas Blower, MD  Patient is from: Home.  DOA: 06/08/2020 LOS: 27  Chief complaints: Cardiac arrest  Brief Narrative / Interim history: 50 year old F with PMH of PTSD, depression, anxiety, HTN, OA on chronic opiate and recent COVID-19 infection brought to ED due to cardiac arrest/asystole with successful ROSC with 3 minutes of CPR, epinephrine and King airway.  She was admitted to ICU for cardiac arrest/shock, acute hypoxemic respiratory failure, aspiration pneumonia, severe acidosis, AKI, hypothermia and hypokalemia.  She was intubated 2/16-2/20. Started on CRRT and transitioned to IHD on 06/15/20.  Received IV Zosyn from 2/16-2/22. Eventually transferred to [facility] service on 2/24 for further care.  Patient had drop in her hemoglobin.  She also had some hematochezia likely from hemorrhoid.  GI consulted.  She was transfused 1 unit with appropriate response, and H&H remained stable.  Therapy recommended CIR  Subjective: Patient seen and examined.  She is alert and oriented.  She has no complaints.  Objective: Vitals:   06/19/20 0414 06/19/20 0500 06/19/20 0752 06/19/20 1112  BP: 115/83  131/83 113/69  Pulse: 85  (!) 101 97  Resp: 18  15 18   Temp: 97.8 F (36.6 C)  98 F (36.7 C) 97.9 F (36.6 C)  TempSrc: Oral  Axillary Oral  SpO2: 97%  96%   Weight:  83.4 kg    Height:        Intake/Output Summary (Last 24 hours) at 06/19/2020 1147 Last data filed at 06/19/2020 1114 Gross per 24 hour  Intake 813 ml  Output --  Net 813 ml   Filed Weights   06/17/20 1530 06/18/20 0315 06/19/20 0500  Weight: 90 kg 92.4 kg 83.4 kg    Examination:  General exam: Appears calm and comfortable, obese Respiratory system: Clear to auscultation. Respiratory effort normal. Cardiovascular system: S1 & S2 heard, RRR. No JVD, murmurs, rubs, gallops or clicks.  Trace pitting edema bilateral lower extremity Gastrointestinal  system: Abdomen is nondistended, soft and nontender. No organomegaly or masses felt. Normal bowel sounds heard. Central nervous system: Alert and oriented. No focal neurological deficits. Extremities: Symmetric 5 x 5 power. Skin: No rashes, lesions or ulcers.  Psychiatry: Judgement and insight appear poor  Procedures:  ETT 2/16 >> 2/20 Rt IJ HD cath 2/16 >> 2/22 Lt IJ CVL 2/16 >> 2/23  Rt femoral arterial line 2/18 >> 2/21 R Perm Cath (per IR) 2/22 >>  First IHD on 2/23  Microbiology summarized: BCx2 (HD cath) 2/16 >> negative  BCx2 2/16 >> negative  MRSA PCR 2/16 >> positive  Assessment & Plan: Acute hypoxic respiratory failure with compromised airway in setting of cardiac arrest and aspiration pneumonitis/pneumonia: Resolved.  Currently stable on room air. -Evaluated by cardiology, Dr. Gwenlyn Found in ED. TTE without significant finding. -IV Zosyn 2/16-2/22 -Intubated on 2/16 and extubated on 2/20.  -Continue incentive spirometry, OOB/PT/OT  Shock-likely combination of cardiac and septic: POA. UDS was positive for amphetamine on admission but she denies use.  She received epinephrine for resuscitation.  Not sure if this affects the results.  Treated for sepsis as above. TTE without significant finding.  BP stable on midodrine. -Continue midodrine 10 mg 3 times daily for now -Hold home antihypertensive meds.  Anuric AKI/azotemia: Likely ATN in the setting of shock, hypoxia and sepsis.  Received CRRT and transitioned to IHD on 06/15/2020.   -Plan for HD MWF per nephrology.  Acute toxic and metabolic encephalopathy-due to the  above and possible polypharmacy.  Resolved. -Reorientation and delirium precautions -Minimize or avoid sedating medications.  Currently very pleasant and alert and oriented  Hx of depression, PTSD, anxiety and history of prior suicide attempts.  Reportedly, she has been in bed since November 2021.  She denies suicidal ideation or attempt. -Psych started Abilify  and Prozac, recommended outpatient f/up and outpatient substance abuse program -Outpatient psych follow-up and outpatient substance abuse program -Holding home Lamictal, risperidone, Zanaflex, trazodone and Xanax.  No evidence of withdrawal  Substance use: UDS positive for amphetamines "not prescribed" but she denies use.  -Outpatient substance abuse program  Hyponatremia: Likely due to renal failure.  Improved. -Monitor  Hypokalemia/hypophosphatemia-resolved.  Essential hypertension: Normotensive -Continue holding home lisinopril and HCTZ  Hyperglycemia/prediabetes: A1c 6.1% on 2/16. No results for input(s): HGBA1C in the last 72 hours. Recent Labs  Lab 06/18/20 2130 06/19/20 0034 06/19/20 0412 06/19/20 0737 06/19/20 1111  GLUCAP 190* 197* 182* 170* 169*  -Continue SSI-sensitive scale  Dysphagia-upgraded to dysphagia-2 diet by SLP -Already changing to nocturnal TF  Anemia of critical illness and possible ABLA: small hematochezia in stool the morning of 2/24. H&H stable after 1 unit.   Recent Labs    06/11/20 0306 06/12/20 0258 06/13/20 0328 06/15/20 0414 06/16/20 0423 06/16/20 1401 06/16/20 2143 06/17/20 1015 06/18/20 0201 06/19/20 0303  HGB 9.8* 9.3* 9.5* 7.4* 7.1* 7.0* 8.7* 9.1* 8.4* 8.7*  -Monitor H&H -Transfuse for Hgb less than 7 -Will start on chemical DVT prophylaxis. -GI signed off.  Mild thrombocytopenia, likely from sepsis.  Resolved.  Deconditioning: Due to depression and acute illness. -See plan above -PT / OT-recommended CIR  Recent COVID-19 infection: Tested positive on 1/18.  Had a mild illness and did not require treatment or hospitalization. -Outside the window for isolation  Class I obesity Body mass index is 29.68 kg/m. Nutrition Problem: Inadequate oral intake Etiology: acute illness Signs/Symptoms: NPO status Interventions: Tube feeding   DVT prophylaxis:  Place and maintain sequential compression device Start: 06/16/20  1313 Place and maintain sequential compression device Start: 06/08/20 1602 Will resume subcu heparin if H&H remains stable tomorrow.  Code Status: Full code Family Communication: Updated patient's husband at bedside on 2/25. Level of care: Telemetry Medical Status is: Inpatient  Remains inpatient appropriate because:Unsafe d/c plan and Inpatient level of care appropriate due to severity of illness   Dispo: The patient is from: Home              Anticipated d/c is to: CIR              Patient currently is medically stable to d/c.   Difficult to place patient No       Consultants:  PCCM-signed off Nephrology-following Gastroenterology-signed off   Sch Meds:  Scheduled Meds: . sodium chloride   Intravenous Once  . ARIPiprazole  5 mg Per Tube Daily  . chlorhexidine  15 mL Mouth Rinse BID  . Chlorhexidine Gluconate Cloth  6 each Topical Q0600  . darbepoetin (ARANESP) injection - NON-DIALYSIS  40 mcg Subcutaneous Q Thu-1800  . feeding supplement  237 mL Oral BID BM  . feeding supplement (PROSource TF)  90 mL Per Tube BID  . feeding supplement (VITAL 1.5 CAL)  720 mL Per Tube Q24H  . FLUoxetine  40 mg Per Tube Daily  . hydrocortisone  25 mg Rectal BID  . insulin aspart  0-9 Units Subcutaneous Q4H  . mouth rinse  15 mL Mouth Rinse q12n4p  . midodrine  10  mg Per Tube TID WC  . multivitamin  1 tablet Per Tube QHS  . pantoprazole sodium  40 mg Per Tube Q24H  . sodium chloride flush  10-40 mL Intracatheter Q12H   Continuous Infusions: . sodium chloride Stopped (06/15/20 1409)   PRN Meds:.acetaminophen, docusate, fentaNYL (SUBLIMAZE) injection, polyethylene glycol, Resource ThickenUp Clear, sodium chloride flush  Antimicrobials: Anti-infectives (From admission, onward)   Start     Dose/Rate Route Frequency Ordered Stop   06/15/20 1500  piperacillin-tazobactam (ZOSYN) IVPB 2.25 g  Status:  Discontinued        2.25 g 100 mL/hr over 30 Minutes Intravenous Every 8 hours  06/15/20 0827 06/15/20 1002   06/13/20 1800  piperacillin-tazobactam (ZOSYN) IVPB 3.375 g  Status:  Discontinued        3.375 g 12.5 mL/hr over 240 Minutes Intravenous Every 12 hours 06/13/20 1114 06/15/20 0827   06/08/20 1800  piperacillin-tazobactam (ZOSYN) IVPB 3.375 g  Status:  Discontinued        3.375 g 100 mL/hr over 30 Minutes Intravenous Every 6 hours 06/08/20 1608 06/13/20 1114       I have personally reviewed the following labs and images: CBC: Recent Labs  Lab 06/13/20 0328 06/15/20 0414 06/16/20 0423 06/16/20 1401 06/16/20 2143 06/17/20 1015 06/18/20 0201 06/19/20 0303  WBC 8.8 9.1 7.7  --   --  10.3  --   --   HGB 9.5* 7.4* 7.1* 7.0* 8.7* 9.1* 8.4* 8.7*  HCT 30.3* 22.8* 22.5* 21.9* 26.3* 27.7* 25.8* 26.9*  MCV 97.7 96.6 97.8  --   --  92.0  --   --   PLT 103* 152 189  --   --  295  --   --    BMP &GFR Recent Labs  Lab 06/13/20 0328 06/13/20 1506 06/14/20 0423 06/15/20 0414 06/16/20 0423 06/17/20 1015 06/18/20 0152 06/19/20 0303  NA 136   < > 137 136 133* 130* 132* 134*  K 4.0   < > 4.2 5.1 4.0 4.3 3.6 3.7  CL 101   < > 98 96* 95* 93* 96* 97*  CO2 26   < > 24 22 25 22 25 24   GLUCOSE 186*   < > 189* 205* 194* 141* 196* 183*  BUN 20   < > 58* 90* 38* 64* 30* 49*  CREATININE 1.38*   < > 3.34* 4.93* 2.76* 4.34* 2.55* 3.60*  CALCIUM 7.7*   < > 7.8* 8.2* 8.3* 8.9 8.4* 8.7*  MG 2.1  --  2.1 2.2  --  1.9  --  1.9  PHOS 1.8*   < > 4.1 4.4 3.1 4.1 3.5 5.0*   < > = values in this interval not displayed.   Estimated Creatinine Clearance: 20.6 mL/min (A) (by C-G formula based on SCr of 3.6 mg/dL (H)). Liver & Pancreas: Recent Labs  Lab 06/15/20 0414 06/16/20 0423 06/17/20 1015 06/18/20 0152 06/19/20 0303  AST  --  40  --   --   --   ALT  --  42  --   --   --   ALKPHOS  --  96  --   --   --   BILITOT  --  0.7  --   --   --   PROT  --  5.7*  --   --   --   ALBUMIN 1.9* 2.3* 2.3* 2.2* 2.3*   No results for input(s): LIPASE, AMYLASE in the last 168  hours. No results for input(s):  AMMONIA in the last 168 hours. Diabetic: No results for input(s): HGBA1C in the last 72 hours. Recent Labs  Lab 06/18/20 2130 06/19/20 0034 06/19/20 0412 06/19/20 0737 06/19/20 1111  GLUCAP 190* 197* 182* 170* 169*   Cardiac Enzymes: No results for input(s): CKTOTAL, CKMB, CKMBINDEX, TROPONINI in the last 168 hours. No results for input(s): PROBNP in the last 8760 hours. Coagulation Profile: No results for input(s): INR, PROTIME in the last 168 hours. Thyroid Function Tests: No results for input(s): TSH, T4TOTAL, FREET4, T3FREE, THYROIDAB in the last 72 hours. Lipid Profile: No results for input(s): CHOL, HDL, LDLCALC, TRIG, CHOLHDL, LDLDIRECT in the last 72 hours. Anemia Panel: Recent Labs    06/16/20 1401  VITAMINB12 844  FOLATE 6.5  FERRITIN 347*  TIBC 193*  IRON 62  RETICCTPCT 3.6*   Urine analysis:    Component Value Date/Time   COLORURINE AMBER (A) 06/08/2020 1512   APPEARANCEUR CLOUDY (A) 06/08/2020 1512   LABSPEC 1.023 06/08/2020 1512   PHURINE 5.0 06/08/2020 1512   GLUCOSEU NEGATIVE 06/08/2020 1512   HGBUR NEGATIVE 06/08/2020 [place] 06/08/2020 [place] 06/08/2020 1512   PROTEINUR NEGATIVE 06/08/2020 1512   UROBILINOGEN 0.2 01/19/2015 0302   NITRITE NEGATIVE 06/08/2020 1512   LEUKOCYTESUR NEGATIVE 06/08/2020 1512   Sepsis Labs: Invalid input(s): PROCALCITONIN, [place]  Microbiology: No results found for this or any previous visit (from the past 240 hour(s)).  Radiology Studies: No results found.   Darliss Cheney, MD [facility]  If 7PM-7AM, please contact night-coverage [email or web address] 06/19/2020, 11:47 AM

## 2020-06-19 NOTE — Progress Notes (Signed)
Pt urinated a large amount in bed.  Also had a BM.  Idolina Primer, RN

## 2020-06-19 NOTE — Progress Notes (Signed)
[facility] ROUNDING NOTE   Subjective:   Transferred to the floor in interim since last exam.  No UOP charted - she thinks she had some.  Last HD on 2/25 with 900 mL UF charted  Review of systems: Denies shortness of breath  Denies chest pain  Denies n/v or diarrhea   ------------------ Brief History: 50 y.o. female. With a significant history of hypertension, endometriosis and depression that was seen in the IR 05/10/20 for abdominal pain and was found to have Covid 19 infection   She was mildly symptomatic and was discharged home on 05/12/20. She apparently fell out of bed during the night and was found to be in asystole and she was intubated and underwent chest compressions as well as EPI with ROSC. Thought that cardiac arrest was related to opioid overdose. AKI now requiring CRRT starting 2/16 likely 2/2 ATN.   In the ER, she was seen by Dr Gwenlyn Found, Cardiology and 2 D Echo performed   She had a preserved ejection fraction and no wall motion abnormality. Her blood pressure was 58/33  Pulse was 87 and she was hypothermic at 92 degrees.   Objective:  Vital signs in last 24 hours:  Temp:  [97.7 F (36.5 C)-98.9 F (37.2 C)] 98 F (36.7 C) (02/27 0752) Pulse Rate:  [80-104] 101 (02/27 0752) Resp:  [12-21] 15 (02/27 0752) BP: (115-138)/(68-110) 131/83 (02/27 0752) SpO2:  [92 %-97 %] 96 % (02/27 0752) Weight:  [83.4 kg] 83.4 kg (02/27 0500)  Weight change: -7.6 kg Filed Weights   06/17/20 1530 06/18/20 0315 06/19/20 0500  Weight: 90 kg 92.4 kg 83.4 kg    Intake/Output: I/O last 3 completed shifts: In: 1340 [P.O.:90; NG/GT:1250] Out: -    Intake/Output this shift:  No intake/output data recorded.  General adult female in bed in no acute distress   HEENT normocephalic atraumatic extraocular movements intact sclera anicteric; nasal tube in place Neck supple trachea midline Lungs clear to auscultation bilaterally normal work of breathing at rest on room air Heart  S1S2 no rub Abdomen soft nontender nondistended; obese habitus Extremities no edema; warm and perfused Neuro - more awake and interactive today; answers questions and follows commands Psych no anxiety or agitation  Access RIJ tunneled catheter  Basic Metabolic Panel: Recent Labs  Lab 06/13/20 0328 06/13/20 1506 06/14/20 0423 06/15/20 0414 06/16/20 0423 06/17/20 1015 06/18/20 0152 06/19/20 0303  NA 136   < > 137 136 133* 130* 132* 134*  K 4.0   < > 4.2 5.1 4.0 4.3 3.6 3.7  CL 101   < > 98 96* 95* 93* 96* 97*  CO2 26   < > 24 22 25 22 25 24   GLUCOSE 186*   < > 189* 205* 194* 141* 196* 183*  BUN 20   < > 58* 90* 38* 64* 30* 49*  CREATININE 1.38*   < > 3.34* 4.93* 2.76* 4.34* 2.55* 3.60*  CALCIUM 7.7*   < > 7.8* 8.2* 8.3* 8.9 8.4* 8.7*  MG 2.1  --  2.1 2.2  --  1.9  --  1.9  PHOS 1.8*   < > 4.1 4.4 3.1 4.1 3.5 5.0*   < > = values in this interval not displayed.    Liver Function Tests: Recent Labs  Lab 06/15/20 0414 06/16/20 0423 06/17/20 1015 06/18/20 0152 06/19/20 0303  AST  --  40  --   --   --   ALT  --  42  --   --   --  ALKPHOS  --  96  --   --   --   BILITOT  --  0.7  --   --   --   PROT  --  5.7*  --   --   --   ALBUMIN 1.9* 2.3* 2.3* 2.2* 2.3*   CBC: Recent Labs  Lab 06/13/20 0328 06/15/20 0414 06/16/20 0423 06/16/20 1401 06/16/20 2143 06/17/20 1015 06/18/20 0201 06/19/20 0303  WBC 8.8 9.1 7.7  --   --  10.3  --   --   HGB 9.5* 7.4* 7.1* 7.0* 8.7* 9.1* 8.4* 8.7*  HCT 30.3* 22.8* 22.5* 21.9* 26.3* 27.7* 25.8* 26.9*  MCV 97.7 96.6 97.8  --   --  92.0  --   --   PLT 103* 152 189  --   --  295  --   --     Microbiology: Results for orders placed or performed during the hospital encounter of 06/08/20  MRSA PCR Screening     Status: Abnormal   Collection Time: 06/08/20  8:44 PM   Specimen: Nasopharyngeal  Result Value Ref Range Status   MRSA by PCR POSITIVE (A) NEGATIVE Final    Comment:        The GeneXpert MRSA Assay (FDA approved for NASAL  specimens only), is one component of a comprehensive MRSA colonization surveillance program. It is not intended to diagnose MRSA infection nor to guide or monitor treatment for MRSA infections. RESULT CALLED TO, READ BACK BY AND VERIFIED WITH: Ferrel Logan 06/08/20 2359 JDW Performed at [facility], [address] [place], [place] [address]   Blood culture (routine x 2)     Status: None   Collection Time: 06/08/20  9:25 PM   Specimen: BLOOD  Result Value Ref Range Status   Specimen Description BLOOD HEMODIALYSIS CATHETER  Final   Special Requests   Final    BOTTLES DRAWN AEROBIC AND ANAEROBIC Blood Culture results may not be optimal due to an excessive volume of blood received in culture bottles   Culture   Final    NO GROWTH 5 DAYS Performed at [facility], [place] [address] [place], [place] [address]    Report Status 06/13/2020 FINAL  Final  Blood culture (routine x 2)     Status: None   Collection Time: 06/08/20  9:25 PM   Specimen: BLOOD LEFT WRIST  Result Value Ref Range Status   Specimen Description BLOOD LEFT WRIST  Final   Special Requests   Final    BOTTLES DRAWN AEROBIC ONLY Blood Culture adequate volume   Culture   Final    NO GROWTH 5 DAYS Performed at [facility], [place] [address], [place],  [address]    Report Status 06/14/2020 FINAL  Final    Imaging: No results found.   Medications:   . sodium chloride Stopped (06/15/20 1409)   . sodium chloride   Intravenous Once  . ARIPiprazole  5 mg Per Tube Daily  . chlorhexidine  15 mL Mouth Rinse BID  . Chlorhexidine Gluconate Cloth  6 each Topical Q0600  . darbepoetin (ARANESP) injection - NON-DIALYSIS  40 mcg Subcutaneous Q Thu-1800  . dicyclomine  10 mg Per Tube TID AC  . feeding supplement  237 mL Oral BID BM  . feeding supplement (PROSource TF)  90 mL Per Tube BID  . feeding supplement (VITAL 1.5 CAL)  720 mL Per Tube Q24H  . FLUoxetine  40 mg Per Tube  Daily  . hydrocortisone  25 mg  Rectal BID  . insulin aspart  0-9 Units Subcutaneous Q4H  . mouth rinse  15 mL Mouth Rinse q12n4p  . midodrine  10 mg Per Tube TID WC  . multivitamin  1 tablet Per Tube QHS  . pantoprazole sodium  40 mg Per Tube Q24H  . sodium chloride flush  10-40 mL Intracatheter Q12H   acetaminophen, docusate, fentaNYL (SUBLIMAZE) injection, polyethylene glycol, Resource ThickenUp Clear, sodium chloride flush  Assessment/ Plan:  1.Acute kidney Injury:   Severe AKI with creatinine of 25 on arrival likely ATN.  Normal Cr at baseline. No signs of hydronephrosis or glomerular disease.  Mild elevation in CPK.   CRRT stopped on 2/21 after catheter malfunction and tunneled catheter with IR on 2/22 and had HD on 2/23.   - HD tomorrow, 2/28.  Plan for MWF schedule for HD for now - clearance improved but anuric.  Per her trends would need to assess need for CLIP early next week   2. Hypotension.  Pressors now off.  On midodrine 10 mg TID (started inpatient)  3. Acute hypoxic resp failure - recent mild covid.  Off vent and now on room air.      4. Encephalopathy - CT head and drug screen and airway management per CCM.  Improved and more conversant  5. Hyperphosphatemia: mild. Hx hypophos with CRRT.    6. Hyperkalemia   resolved with RRT  7. Normocytic anemia - secondary in part to CKD. aranesp 40 mcg on thursdays starting 2/24. Iron panel ok  Claudia Desanctis, MD 06/19/2020 10:28 AM

## 2020-06-20 DIAGNOSIS — J9601 Acute respiratory failure with hypoxia: Secondary | ICD-10-CM | POA: Diagnosis not present

## 2020-06-20 LAB — RENAL FUNCTION PANEL
Albumin: 2.3 g/dL — ABNORMAL LOW (ref 3.5–5.0)
Anion gap: 14 (ref 5–15)
BUN: 57 mg/dL — ABNORMAL HIGH (ref 6–20)
CO2: 23 mmol/L (ref 22–32)
Calcium: 8.8 mg/dL — ABNORMAL LOW (ref 8.9–10.3)
Chloride: 99 mmol/L (ref 98–111)
Creatinine, Ser: 3.8 mg/dL — ABNORMAL HIGH (ref 0.44–1.00)
GFR, Estimated: 14 mL/min — ABNORMAL LOW (ref >60)
Glucose, Bld: 194 mg/dL — ABNORMAL HIGH (ref 70–99)
Phosphorus: 5.7 mg/dL — ABNORMAL HIGH (ref 2.5–4.6)
Potassium: 3.6 mmol/L (ref 3.5–5.1)
Sodium: 136 mmol/L (ref 135–145)

## 2020-06-20 LAB — CBC
HCT: 24.3 % — ABNORMAL LOW (ref 36.0–46.0)
Hemoglobin: 8.3 g/dL — ABNORMAL LOW (ref 12.0–15.0)
MCH: 31.9 pg (ref 26.0–34.0)
MCHC: 34.2 g/dL (ref 30.0–36.0)
MCV: 93.5 fL (ref 80.0–100.0)
Platelets: 369 10*3/uL (ref 150–400)
RBC: 2.6 MIL/uL — ABNORMAL LOW (ref 3.87–5.11)
RDW: 14.6 % (ref 11.5–15.5)
WBC: 7.3 10*3/uL (ref 4.0–10.5)
nRBC: 0 % (ref 0.0–0.2)

## 2020-06-20 LAB — GLUCOSE, CAPILLARY
Glucose-Capillary: 125 mg/dL — ABNORMAL HIGH (ref 70–99)
Glucose-Capillary: 129 mg/dL — ABNORMAL HIGH (ref 70–99)
Glucose-Capillary: 173 mg/dL — ABNORMAL HIGH (ref 70–99)
Glucose-Capillary: 178 mg/dL — ABNORMAL HIGH (ref 70–99)
Glucose-Capillary: 186 mg/dL — ABNORMAL HIGH (ref 70–99)

## 2020-06-20 MED ORDER — ONDANSETRON HCL 4 MG/2ML IJ SOLN
4.0000 mg | Freq: Four times a day (QID) | INTRAMUSCULAR | Status: DC | PRN
  Administered 2020-06-20 – 2020-06-23 (×5): 4 mg via INTRAVENOUS
  Filled 2020-06-20 (×5): qty 2

## 2020-06-20 MED ORDER — HEPARIN SODIUM (PORCINE) 1000 UNIT/ML IJ SOLN
INTRAMUSCULAR | Status: AC
  Filled 2020-06-20: qty 3

## 2020-06-20 NOTE — Progress Notes (Signed)
Physical Therapy Treatment Patient Details Name: [name] MRN: [id number] DOB: 22-Dec-1970 Today's Date: 06/20/2020    History of Present Illness 50 yo 2/16 after fell out of bed and family checked on her later and found unresponsive s/p arrest and CPR with intubation. ?overdose suspected. CRRT 2/16-2/20. Extubated 2/20. PMhx: HTN, HLD, anxiety/depression, PTSD, Covid + 1/18, Rt ankle fx s/p ORIF, endometriosis    PT Comments    Pt making steady progress but still exhibits cognitive, mobility, and balance deficits. Continue to recommend CIR for further therapy.    Follow Up Recommendations  CIR;Supervision/Assistance - 24 hour     Equipment Recommendations  None recommended by PT    Recommendations for Other Services       Precautions / Restrictions Precautions Precautions: Fall;Other (comment) Precaution Comments: cortrak Restrictions Weight Bearing Restrictions: No RLE Weight Bearing: Weight bearing as tolerated    Mobility  Bed Mobility Overal bed mobility: Needs Assistance Bed Mobility: Supine to Sit     Supine to sit: Min guard;HOB elevated     General bed mobility comments: Assist for safety and lines. Incr time to perform    Transfers Overall transfer level: Needs assistance Equipment used: Rolling walker (2 wheeled) Transfers: Sit to/from Stand Sit to Stand: Min assist         General transfer comment: Assist for balance. Verbal cues for hand placement.  Ambulation/Gait Ambulation/Gait assistance: Min assist Gait Distance (Feet): 60 Feet Assistive device: Rolling walker (2 wheeled) Gait Pattern/deviations: Step-through pattern;Decreased stride length;Trunk flexed;Wide base of support Gait velocity: decr Gait velocity interpretation: 1.31 - 2.62 ft/sec, indicative of limited community ambulator General Gait Details: Assist for balance and support. Verbal cues for posture.   Stairs             Wheelchair Mobility    Modified  [place] (Stroke Patients Only)       Balance Overall balance assessment: Needs assistance Sitting-balance support: No upper extremity supported;Feet supported Sitting balance-Leahy Scale: Fair     Standing balance support: Bilateral upper extremity supported Standing balance-Leahy Scale: Poor Standing balance comment: walker and min guard for static standing                            Cognition Arousal/Alertness: Awake/alert Behavior During Therapy: Flat affect Overall Cognitive Status: Impaired/Different from baseline Area of Impairment: Attention;Memory;Following commands;Safety/judgement;Awareness;Problem solving                   Current Attention Level: Selective Memory: Decreased short-term memory Following Commands: Follows one step commands consistently Safety/Judgement: Decreased awareness of safety;Decreased awareness of deficits Awareness: Emergent Problem Solving: Slow processing;Decreased initiation;Difficulty sequencing;Requires verbal cues        Exercises      General Comments        Pertinent Vitals/Pain Pain Assessment: No/denies pain    Home Living                      Prior Function            PT Goals (current goals can now be found in the care plan section) Acute Rehab PT Goals Patient Stated Goal: return home Progress towards PT goals: Progressing toward goals    Frequency    Min 3X/week      PT Plan Current plan remains appropriate    Co-evaluation              AM-PAC PT "6 Clicks"  Mobility   Outcome Measure  Help needed turning from your back to your side while in a flat bed without using bedrails?: A Little Help needed moving from lying on your back to sitting on the side of a flat bed without using bedrails?: A Little Help needed moving to and from a bed to a chair (including a wheelchair)?: A Little Help needed standing up from a chair using your arms (e.g., wheelchair or bedside chair)?:  A Little Help needed to walk in hospital room?: A Little Help needed climbing 3-5 steps with a railing? : A Lot 6 Click Score: 17    End of Session Equipment Utilized During Treatment: Gait belt Activity Tolerance: Patient tolerated treatment well Patient left: in chair;with call bell/phone within reach;with chair alarm set Nurse Communication: Mobility status PT Visit Diagnosis: Other abnormalities of gait and mobility (R26.89);Difficulty in walking, not elsewhere classified (R26.2);Muscle weakness (generalized) (M62.81)     Time: 7225-7505 PT Time Calculation (min) (ACUTE ONLY): 32 min  Charges:  $Gait Training: 23-37 mins                     [place] Pager [phone] Office [place] 06/20/2020, 5:44 PM

## 2020-06-20 NOTE — Progress Notes (Signed)
PROGRESS NOTE  [name] ION:[id number] DOB: [birth year]   PCP: Andreas Blower, MD  Patient is from: Home.  DOA: 06/08/2020 LOS: 12  Chief complaints: Cardiac arrest  Brief Narrative / Interim history: 50 year old F with PMH of PTSD, depression, anxiety, HTN, OA on chronic opiate and recent COVID-19 infection brought to ED due to cardiac arrest/asystole with successful ROSC with 3 minutes of CPR, epinephrine and King airway.  She was admitted to ICU for cardiac arrest/shock, acute hypoxemic respiratory failure, aspiration pneumonia, severe acidosis, AKI, hypothermia and hypokalemia.  She was intubated 2/16-2/20. Started on CRRT and transitioned to IHD on 06/15/20.  Received IV Zosyn from 2/16-2/22. Eventually transferred to [facility] service on 2/24 for further care.  Patient had drop in her hemoglobin.  She also had some hematochezia likely from hemorrhoid.  GI consulted.  She was transfused 1 unit with appropriate response, and H&H remained stable.  Therapy recommended CIR  Subjective: Patient seen and examined in dialysis unit.  She has no complaints.  Objective: Vitals:   06/20/20 1000 06/20/20 1029 06/20/20 1040 06/20/20 1100  BP: 123/69 122/71  121/74  Pulse:  (!) 102 97   Resp:  16 (!) 22   Temp:  98.6 F (37 C)    TempSrc:  Oral    SpO2:  97% 97%   Weight:  85.6 kg    Height:        Intake/Output Summary (Last 24 hours) at 06/20/2020 1337 Last data filed at 06/20/2020 1029 Gross per 24 hour  Intake 0 ml  Output 1000 ml  Net -1000 ml   Filed Weights   06/19/20 0500 06/20/20 0716 06/20/20 1029  Weight: 83.4 kg 86.7 kg 85.6 kg    Examination:  General exam: Appears calm and comfortable  Respiratory system: Clear to auscultation. Respiratory effort normal. Cardiovascular system: S1 & S2 heard, RRR. No JVD, murmurs, rubs, gallops or clicks. No pedal edema. Gastrointestinal system: Abdomen is nondistended, soft and nontender. No organomegaly or masses felt.  Normal bowel sounds heard. Central nervous system: Alert and oriented. No focal neurological deficits. Extremities: Symmetric 5 x 5 power. Skin: No rashes, lesions or ulcers.  Psychiatry: Judgement and insight appear poor  Procedures:  ETT 2/16 >> 2/20 Rt IJ HD cath 2/16 >> 2/22 Lt IJ CVL 2/16 >> 2/23  Rt femoral arterial line 2/18 >> 2/21 R Perm Cath (per IR) 2/22 >>  First IHD on 2/23  Microbiology summarized: BCx2 (HD cath) 2/16 >> negative  BCx2 2/16 >> negative  MRSA PCR 2/16 >> positive  Assessment & Plan: Acute hypoxic respiratory failure with compromised airway in setting of cardiac arrest and aspiration pneumonitis/pneumonia: Resolved.  Currently stable on room air. -Evaluated by cardiology, Dr. Gwenlyn Found in ED. TTE without significant finding. -IV Zosyn 2/16-2/22 -Intubated on 2/16 and extubated on 2/20.  -Continue incentive spirometry, OOB/PT/OT  Shock-likely combination of cardiac and septic: POA. UDS was positive for amphetamine on admission but she denies use.  She received epinephrine for resuscitation.  Not sure if this affects the results.  Treated for sepsis as above. TTE without significant finding.  BP stable on midodrine. -Continue midodrine 10 mg 3 times daily for now -Hold home antihypertensive meds.  Anuric AKI/azotemia: Likely ATN in the setting of shock, hypoxia and sepsis.  Received CRRT and transitioned to IHD on 06/15/2020.   -Plan for HD MWF per nephrology.  Acute toxic and metabolic encephalopathy-due to the above and possible polypharmacy.  Resolved. -Reorientation and delirium precautions -Minimize  or avoid sedating medications.  Currently very pleasant and alert and oriented  Hx of depression, PTSD, anxiety and history of prior suicide attempts.  Reportedly, she has been in bed since November 2021.  She denies suicidal ideation or attempt. -Psych started Abilify and Prozac, recommended outpatient f/up and outpatient substance abuse  program -Outpatient psych follow-up and outpatient substance abuse program -Holding home Lamictal, risperidone, Zanaflex, trazodone and Xanax.  No evidence of withdrawal  Substance use: UDS positive for amphetamines "not prescribed" but she denies use.  -Outpatient substance abuse program  Hyponatremia: Likely due to renal failure.  Improved. -Monitor  Hypokalemia/hypophosphatemia-resolved.  Essential hypertension: Normotensive -Continue holding home lisinopril and HCTZ  Hyperglycemia/prediabetes: A1c 6.1% on 2/16. No results for input(s): HGBA1C in the last 72 hours. Recent Labs  Lab 06/19/20 1631 06/19/20 2042 06/20/20 0003 06/20/20 0403 06/20/20 1209  GLUCAP 135* 160* 186* 178* 125*  -Continue SSI-sensitive scale  Dysphagia-upgraded to dysphagia-2 diet by SLP -Already changing to nocturnal TF  Anemia of critical illness and possible ABLA: small hematochezia in stool the morning of 2/24. H&H stable after 1 unit.   Recent Labs    06/12/20 0258 06/13/20 0328 06/15/20 0414 06/16/20 0423 06/16/20 1401 06/16/20 2143 06/17/20 1015 06/18/20 0201 06/19/20 0303 06/20/20 0147  HGB 9.3* 9.5* 7.4* 7.1* 7.0* 8.7* 9.1* 8.4* 8.7* 8.3*  -Monitor H&H -Transfuse for Hgb less than 7 -Will start on chemical DVT prophylaxis. -GI signed off.  Mild thrombocytopenia, likely from sepsis.  Resolved.  Deconditioning: Due to depression and acute illness. -See plan above -PT / OT-recommended CIR  Recent COVID-19 infection: Tested positive on 1/18.  Had a mild illness and did not require treatment or hospitalization. -Outside the window for isolation  Class I obesity Body mass index is 30.46 kg/m. Nutrition Problem: Inadequate oral intake Etiology: acute illness Signs/Symptoms: NPO status Interventions: Tube feeding   DVT prophylaxis:  heparin injection 5,000 Units Start: 06/19/20 1400 Place and maintain sequential compression device Start: 06/16/20 1313 Will resume subcu  heparin if H&H remains stable tomorrow.  Code Status: Full code Family Communication: Updated patient's husband at bedside on 2/25. Level of care: Telemetry Medical Status is: Inpatient  Remains inpatient appropriate because:Unsafe d/c plan and Inpatient level of care appropriate due to severity of illness   Dispo: The patient is from: Home              Anticipated d/c is to: CIR              Patient currently is medically stable to d/c.   Difficult to place patient No       Consultants:  PCCM-signed off Nephrology-following Gastroenterology-signed off   Sch Meds:  Scheduled Meds: . sodium chloride   Intravenous Once  . ARIPiprazole  5 mg Per Tube Daily  . chlorhexidine  15 mL Mouth Rinse BID  . Chlorhexidine Gluconate Cloth  6 each Topical Q0600  . darbepoetin (ARANESP) injection - NON-DIALYSIS  40 mcg Subcutaneous Q Thu-1800  . feeding supplement  237 mL Oral BID BM  . feeding supplement (PROSource TF)  90 mL Per Tube BID  . feeding supplement (VITAL 1.5 CAL)  720 mL Per Tube Q24H  . FLUoxetine  40 mg Per Tube Daily  . heparin injection (subcutaneous)  5,000 Units Subcutaneous Q8H  . hydrocortisone  25 mg Rectal BID  . insulin aspart  0-9 Units Subcutaneous Q4H  . mouth rinse  15 mL Mouth Rinse q12n4p  . midodrine  10 mg Per Tube  TID WC  . multivitamin  1 tablet Per Tube QHS  . pantoprazole sodium  40 mg Per Tube Q24H  . sodium chloride flush  10-40 mL Intracatheter Q12H   Continuous Infusions: . sodium chloride Stopped (06/15/20 1409)   PRN Meds:.acetaminophen, docusate, fentaNYL (SUBLIMAZE) injection, ondansetron (ZOFRAN) IV, polyethylene glycol, Resource ThickenUp Clear, sodium chloride flush  Antimicrobials: Anti-infectives (From admission, onward)   Start     Dose/Rate Route Frequency Ordered Stop   06/15/20 1500  piperacillin-tazobactam (ZOSYN) IVPB 2.25 g  Status:  Discontinued        2.25 g 100 mL/hr over 30 Minutes Intravenous Every 8 hours 06/15/20  0827 06/15/20 1002   06/13/20 1800  piperacillin-tazobactam (ZOSYN) IVPB 3.375 g  Status:  Discontinued        3.375 g 12.5 mL/hr over 240 Minutes Intravenous Every 12 hours 06/13/20 1114 06/15/20 0827   06/08/20 1800  piperacillin-tazobactam (ZOSYN) IVPB 3.375 g  Status:  Discontinued        3.375 g 100 mL/hr over 30 Minutes Intravenous Every 6 hours 06/08/20 1608 06/13/20 1114       I have personally reviewed the following labs and images: CBC: Recent Labs  Lab 06/15/20 0414 06/16/20 0423 06/16/20 1401 06/16/20 2143 06/17/20 1015 06/18/20 0201 06/19/20 0303 06/20/20 0147  WBC 9.1 7.7  --   --  10.3  --   --  7.3  HGB 7.4* 7.1*   < > 8.7* 9.1* 8.4* 8.7* 8.3*  HCT 22.8* 22.5*   < > 26.3* 27.7* 25.8* 26.9* 24.3*  MCV 96.6 97.8  --   --  92.0  --   --  93.5  PLT 152 189  --   --  295  --   --  369   < > = values in this interval not displayed.   BMP &GFR Recent Labs  Lab 06/14/20 0423 06/15/20 0414 06/16/20 0423 06/17/20 1015 06/18/20 0152 06/19/20 0303 06/20/20 0147  NA 137 136 133* 130* 132* 134* 136  K 4.2 5.1 4.0 4.3 3.6 3.7 3.6  CL 98 96* 95* 93* 96* 97* 99  CO2 24 22 25 22 25 24 23   GLUCOSE 189* 205* 194* 141* 196* 183* 194*  BUN 58* 90* 38* 64* 30* 49* 57*  CREATININE 3.34* 4.93* 2.76* 4.34* 2.55* 3.60* 3.80*  CALCIUM 7.8* 8.2* 8.3* 8.9 8.4* 8.7* 8.8*  MG 2.1 2.2  --  1.9  --  1.9  --   PHOS 4.1 4.4 3.1 4.1 3.5 5.0* 5.7*   Estimated Creatinine Clearance: 19.7 mL/min (A) (by C-G formula based on SCr of 3.8 mg/dL (H)). Liver & Pancreas: Recent Labs  Lab 06/16/20 0423 06/17/20 1015 06/18/20 0152 06/19/20 0303 06/20/20 0147  AST 40  --   --   --   --   ALT 42  --   --   --   --   ALKPHOS 96  --   --   --   --   BILITOT 0.7  --   --   --   --   PROT 5.7*  --   --   --   --   ALBUMIN 2.3* 2.3* 2.2* 2.3* 2.3*   No results for input(s): LIPASE, AMYLASE in the last 168 hours. No results for input(s): AMMONIA in the last 168 hours. Diabetic: No results  for input(s): HGBA1C in the last 72 hours. Recent Labs  Lab 06/19/20 1631 06/19/20 2042 06/20/20 0003 06/20/20 0403 06/20/20 [place]  135* 160* 186* 178* 125*   Cardiac Enzymes: No results for input(s): CKTOTAL, CKMB, CKMBINDEX, TROPONINI in the last 168 hours. No results for input(s): PROBNP in the last 8760 hours. Coagulation Profile: No results for input(s): INR, PROTIME in the last 168 hours. Thyroid Function Tests: No results for input(s): TSH, T4TOTAL, FREET4, T3FREE, THYROIDAB in the last 72 hours. Lipid Profile: No results for input(s): CHOL, HDL, LDLCALC, TRIG, CHOLHDL, LDLDIRECT in the last 72 hours. Anemia Panel: No results for input(s): VITAMINB12, FOLATE, FERRITIN, TIBC, IRON, RETICCTPCT in the last 72 hours. Urine analysis:    Component Value Date/Time   COLORURINE AMBER (A) 06/08/2020 1512   APPEARANCEUR CLOUDY (A) 06/08/2020 1512   LABSPEC 1.023 06/08/2020 1512   PHURINE 5.0 06/08/2020 1512   GLUCOSEU NEGATIVE 06/08/2020 1512   HGBUR NEGATIVE 06/08/2020 [place] 06/08/2020 [place] 06/08/2020 1512   PROTEINUR NEGATIVE 06/08/2020 1512   UROBILINOGEN 0.2 01/19/2015 0302   NITRITE NEGATIVE 06/08/2020 [place] 06/08/2020 1512   Sepsis Labs: Invalid input(s): PROCALCITONIN, [place]  Microbiology: No results found for this or any previous visit (from the past 240 hour(s)).  Radiology Studies: No results found.   Darliss Cheney, MD [facility]  If 7PM-7AM, please contact night-coverage [email or web address] 06/20/2020, 1:37 PM

## 2020-06-20 NOTE — Progress Notes (Signed)
SLP Cancellation Note  Patient Details Name: [name] MRN: [id number] DOB: 01/20/1971   Cancelled treatment:       Reason Eval/Treat Not Completed: Patient at procedure or test/unavailable pt in HD, will f/u   Cornie Mccomber, Katherene Ponto 06/20/2020, 9:51 AM

## 2020-06-20 NOTE — Plan of Care (Signed)

## 2020-06-20 NOTE — Progress Notes (Signed)
Inpatient Rehabilitation Admissions Coordinator  I will pursue CIR admit once bed is available this week. Discussed with Terri Piedra to clarify outpatient hemodialysis needs.  Danne Baxter, RN, MSN Rehab Admissions Coordinator [phone] 06/20/2020 1:56 PM

## 2020-06-20 NOTE — Progress Notes (Signed)
Renal Navigator appreciates call from CIR AC/B. Boyette, who states plans for patient to be admitted to CIR when bed is available. Navigator will follow along and refer patient for outpatient HD closer to discharge from CIR so not to hold an outpatient seat. Navigator notes that patient has Medicare, which means she will not have issue in getting financial clearance for outpatient HD for ESRD or AKI when needed.  Navigator will follow closely.   Alphonzo Cruise, [facility]al Navigator [phone]

## 2020-06-20 NOTE — Procedures (Signed)
I was present at this dialysis session. I have reviewed the session itself and made appropriate changes.   Vital signs in last 24 hours:  Temp:  [97.9 F (36.6 C)-98.7 F (37.1 C)] 98.7 F (37.1 C) (02/27 2000) Pulse Rate:  [90-97] 90 (02/27 2000) Resp:  [16-19] 19 (02/28 0730) BP: (113-142)/(67-78) 122/76 (02/28 0730) SpO2:  [95 %-97 %] 97 % (02/27 2000) Weight:  [86.7 kg] 86.7 kg (02/28 0716) Weight change:  Filed Weights   06/18/20 0315 06/19/20 0500 06/20/20 0716  Weight: 92.4 kg 83.4 kg 86.7 kg    Recent Labs  Lab 06/20/20 0147  NA 136  K 3.6  CL 99  CO2 23  GLUCOSE 194*  BUN 57*  CREATININE 3.80*  CALCIUM 8.8*  PHOS 5.7*    Recent Labs  Lab 06/16/20 0423 06/16/20 1401 06/17/20 1015 06/18/20 0201 06/19/20 0303 06/20/20 0147  WBC 7.7  --  10.3  --   --  7.3  HGB 7.1*   < > 9.1* 8.4* 8.7* 8.3*  HCT 22.5*   < > 27.7* 25.8* 26.9* 24.3*  MCV 97.8  --  92.0  --   --  93.5  PLT 189  --  295  --   --  369   < > = values in this interval not displayed.    Scheduled Meds: . sodium chloride   Intravenous Once  . ARIPiprazole  5 mg Per Tube Daily  . chlorhexidine  15 mL Mouth Rinse BID  . Chlorhexidine Gluconate Cloth  6 each Topical Q0600  . darbepoetin (ARANESP) injection - NON-DIALYSIS  40 mcg Subcutaneous Q Thu-1800  . feeding supplement  237 mL Oral BID BM  . feeding supplement (PROSource TF)  90 mL Per Tube BID  . feeding supplement (VITAL 1.5 CAL)  720 mL Per Tube Q24H  . FLUoxetine  40 mg Per Tube Daily  . heparin injection (subcutaneous)  5,000 Units Subcutaneous Q8H  . hydrocortisone  25 mg Rectal BID  . insulin aspart  0-9 Units Subcutaneous Q4H  . mouth rinse  15 mL Mouth Rinse q12n4p  . midodrine  10 mg Per Tube TID WC  . multivitamin  1 tablet Per Tube QHS  . pantoprazole sodium  40 mg Per Tube Q24H  . sodium chloride flush  10-40 mL Intracatheter Q12H   Continuous Infusions: . sodium chloride Stopped (06/15/20 1409)   PRN  Meds:.acetaminophen, docusate, fentaNYL (SUBLIMAZE) injection, polyethylene glycol, Resource ThickenUp Clear, sodium chloride flush    Assessment/Plan: 1. AKI, oliguric- presumably due to ischemic ATN following cardiac arrest.  Started on CRRT 06/08/20 then stopped 2/21 and transitioned to IHD on 06/15/20.  Remains anuric and dialysis dependent. 2. Asystolic cardiac arrest- s/p CPR with ROSC. 3. Acute hypoxic respiratory failure- improved 4. Shock/hypotension - possible aspiration pna.  Now improved 5. Vascular access- s/p RIJ TDC placed 06/14/20. 6. Hematochezia- GI following. 7. Disposition- PT/OT working with patient.  No return of renal function.  If going to CIR will continue with IHD and follow for renal recovery, otherwise will need outpatient dialysis (AKI)  Donetta Potts,  MD 06/20/2020, 8:44 AM

## 2020-06-21 DIAGNOSIS — J9601 Acute respiratory failure with hypoxia: Secondary | ICD-10-CM | POA: Diagnosis not present

## 2020-06-21 LAB — RENAL FUNCTION PANEL
Albumin: 2.3 g/dL — ABNORMAL LOW (ref 3.5–5.0)
Anion gap: 14 (ref 5–15)
BUN: 23 mg/dL — ABNORMAL HIGH (ref 6–20)
CO2: 25 mmol/L (ref 22–32)
Calcium: 8.8 mg/dL — ABNORMAL LOW (ref 8.9–10.3)
Chloride: 99 mmol/L (ref 98–111)
Creatinine, Ser: 2.2 mg/dL — ABNORMAL HIGH (ref 0.44–1.00)
GFR, Estimated: 27 mL/min — ABNORMAL LOW (ref >60)
Glucose, Bld: 175 mg/dL — ABNORMAL HIGH (ref 70–99)
Phosphorus: 4 mg/dL (ref 2.5–4.6)
Potassium: 3.8 mmol/L (ref 3.5–5.1)
Sodium: 138 mmol/L (ref 135–145)

## 2020-06-21 LAB — GLUCOSE, CAPILLARY
Glucose-Capillary: 103 mg/dL — ABNORMAL HIGH (ref 70–99)
Glucose-Capillary: 113 mg/dL — ABNORMAL HIGH (ref 70–99)
Glucose-Capillary: 130 mg/dL — ABNORMAL HIGH (ref 70–99)
Glucose-Capillary: 149 mg/dL — ABNORMAL HIGH (ref 70–99)
Glucose-Capillary: 200 mg/dL — ABNORMAL HIGH (ref 70–99)

## 2020-06-21 NOTE — Progress Notes (Signed)
PT Cancellation Note  Patient Details Name: [name] MRN: [id number] DOB: 22-Oct-1970   Cancelled Treatment:    Reason Eval/Treat Not Completed: Other (comment). Pt reports she is nauseous and doesn't want to mobilize right now. Will continue to follow.   [place] 06/21/2020, 9:45 AM [place] Pager [phone] Office [phone]

## 2020-06-21 NOTE — Progress Notes (Signed)
OT Cancellation Note  Patient Details Name: [name] MRN: [id number] DOB: [birth year]   Cancelled Treatment:    Reason Eval/Treat Not Completed: Fatigue/lethargy limiting ability to participate;Pain limiting ability to participate;Other (comment) pt reports "not feeling well" and declined ADL participation, functional mobility or EOB session. Will check back as time allows.   Harley Alto., COTA/L Acute Rehabilitation Services [phone]   Precious Haws 06/21/2020, 4:38 PM

## 2020-06-21 NOTE — PMR Pre-admission (Signed)
PMR Admission Coordinator Pre-Admission Assessment  Patient: [name] is an 50 y.o., female MRN: [id number] DOB: 1970/10/26 Height: _0  (167.6 cm) Weight: 81.1 kg  Insurance Information HMO:     PPO:      PCP:      IPA:      80/20:      OTHER:  PRIMARY: Medicare a and b      Policy#: [id number]      Subscriber: pt Benefits:  Phone #: passport one online     Name: 2/22 Eff. Date: a 10/22/2014 b 02/22/2016     Deduct: $1556      Out of Pocket Max: none      Life Max: none CIR: 100%      SNF: 20 full days Outpatient: 80%     Co-Pay: 20% Home Health: 100%      Co-Pay: none DME: 80%     Co-Pay: 20% Providers: pt choice  SECONDARY: none    Financial Counselor:       Phone#:   The Therapist, art Information Summary" for patients in Inpatient Rehabilitation Facilities with attached "Privacy Act [place] Records" was provided and verbally reviewed with: Patient and Family  Emergency Contact Information Contact Information    Name Relation Home Work Mobile   Setters,Randall Spouse   [phone]      Current Medical History  Patient Admitting Diagnosis: Debility  History of Present Illness: [name] is a 50 year old right-handed female with history of depression, opiate use, PTSD with reported prior suicide attempts maintained on Prozac, Lamictal as well as Risperdal,, hypertension, Covid +1/18, endometriosis, ORIF right ankle 03/28/2020 and is weightbearing as tolerated and has been rather sedentary since her fracture.   Presented 06/08/2020 after reportedly falling out of the bed at some point during the night and family let her sleep on the floor when they later checked on her she was unresponsive.  First responders found her to be pulseless and asystolic.  She was intubated with a King airway and received CPR x3 minutes and epi x1 with ROSC.  Immediately post ROSC her EKG was concerning for STEMI but subsequent EKG were improved.  Cranial CT scan negative.  Chemistries  revealed potassium 6.5 chloride 89 glucose 180 BUN 214 creatinine 24.91 from baseline 0.83, WBC 16,200 hemoglobin 10.6, troponin 70, urine drug screen positive amphetamines, alcohol negative, lactic acid 4.4, CK 5564, magnesium 2.7 hemoglobin A1c 6.1.  Renal ultrasound unremarkable.  Echocardiogram with ejection fraction of 55 to 60% no wall motion abnormalities.  She remained intubated 2/16-2/20.  Started on CRRT and transition to IHD on 06/15/2020 followed by renal services with latest renal function much improved creatinine 2.24-1.81 and her hemodialysis is currently on hold as she is showing renal recovery.  Chest x-ray suspicious for aspiration pneumonia completing course of Rocephin. Gastroenterology consulted 06/16/2020 for nonspecific lower abdominal pain and some bright red blood per rectum and hemoglobin dipping to 7.1 overnight day.. It was noted patient with extensive GI work-up in 5784 for IDA and cyclic vomiting to include EGD showing non-H Pyloric gastritis, colonoscopy benign rectal erythema and VCE 1 small nonbleeding distal AVM. She was transfused 1 unit packed red blood cells gastroenterology services felt no further work-up was needed with close monitoring of hemoglobin and hematocrit. Her hemoglobin has stabilized 8.3 and she was cleared to begin subcutaneous heparin for DVT prophylaxis. Her cardiac status remained stable followed by Dr. Gwenlyn Found of cardiology services. She was maintained on midodrine for blood pressure. Psychiatry consulted  for history of depression PTSD with prior suicide attempts and currently remains on Prozac as prior to admission with the addition of Abilify. She is tolerating a dysphagia #2 thin liquid diet and currently receiving nasogastric tube feeds for nutritional support.  Patient's medical record from [facility] has been reviewed by the rehabilitation admission coordinator and physician.  Past Medical History  Past Medical History:  Diagnosis Date  .  Anxiety   . Arthritis    "joints ache all over" (10/15/2014)  . Barrett's esophagus   . Bulging lumbar disc   . Chronic lower back pain   . DDD (degenerative disc disease), cervical   . Depression   . Drug-seeking behavior   . Headache    "weekly" (10/15/2014)  . Hyperlipemia   . Hypertension   . PTSD (post-traumatic stress disorder)   . Skin cancer    "had them cut off my arms; don't know what kind"    Family History   family history includes Breast cancer in her maternal grandmother, mother, and paternal grandmother; Colon polyps in her paternal grandmother; Diabetes in her mother.  Prior Rehab/Hospitalizations Has the patient had prior rehab or hospitalizations prior to admission? Yes  Has the patient had major surgery during 100 days prior to admission? No   Current Medications  Current Facility-Administered Medications:  .  0.9 %  sodium chloride infusion (Manually program via Guardrails IV Fluids), , Intravenous, Once, Gonfa, Taye T, MD .  0.9 %  sodium chloride infusion, 250 mL, Intravenous, Continuous, Truddie Hidden, MD, Stopped at 06/15/20 1409 .  acetaminophen (TYLENOL) tablet 650 mg, 650 mg, Oral, Q6H PRN, Wendee Beavers T, MD, 650 mg at 06/21/20 0720 .  ARIPiprazole (ABILIFY) tablet 5 mg, 5 mg, Per Tube, Daily, Blenda Nicely, RPH, 5 mg at 06/23/20 0850 .  chlorhexidine (PERIDEX) 0.12 % solution 15 mL, 15 mL, Mouth Rinse, BID, Halford Chessman, Vineet, MD, 15 mL at 06/23/20 0850 .  Chlorhexidine Gluconate Cloth 2 % PADS 6 each, 6 each, Topical, Q0600, Claudia Desanctis, MD, 6 each at 06/23/20 [phone] .  Darbepoetin Alfa (ARANESP) injection 40 mcg, 40 mcg, Subcutaneous, Q Thu-1800, Claudia Desanctis, MD, 40 mcg at 06/16/20 8850 .  docusate (COLACE) 50 MG/5ML liquid 100 mg, 100 mg, Per Tube, Daily PRN, Chesley Mires, MD, 100 mg at 06/10/20 2301 .  feeding supplement (PROSource TF) liquid 45 mL, 45 mL, Per Tube, QID, Pahwani, Ravi, MD, 45 mL at 06/23/20 0851 .  feeding supplement (VITAL 1.5  CAL) liquid 1,000 mL, 1,000 mL, Per Tube, Q24H, Pahwani, Ravi, MD, Last Rate: 40 mL/hr at 06/23/20 1100, Infusion Verify at 06/23/20 1100 .  fentaNYL (SUBLIMAZE) injection 12.5-25 mcg, 12.5-25 mcg, Intravenous, Q2H PRN, Wendee Beavers T, MD, 25 mcg at 06/23/20 0907 .  FLUoxetine (PROZAC) capsule 40 mg, 40 mg, Per Tube, Daily, Sood, Vineet, MD, 40 mg at 06/23/20 0851 .  heparin injection 5,000 Units, 5,000 Units, Subcutaneous, Q8H, Christy Gentles, RPH, 5,000 Units at 06/23/20 2774 .  insulin aspart (novoLOG) injection 0-9 Units, 0-9 Units, Subcutaneous, Q4H, Chesley Mires, MD, 1 Units at 06/23/20 [phone] .  MEDLINE mouth rinse, 15 mL, Mouth Rinse, q12n4p, Sood, Vineet, MD, 15 mL at 06/22/20 1814 .  metoCLOPramide (REGLAN) injection 10 mg, 10 mg, Intravenous, Q8H, Pahwani, Ravi, MD .  midodrine (PROAMATINE) tablet 10 mg, 10 mg, Per Tube, TID WC, Audria Nine, DO, 10 mg at 06/21/20 1119 .  multivitamin (RENA-VIT) tablet 1 tablet, 1 tablet, Per Tube, QHS,  Mercy Riding, MD, 1 tablet at 06/22/20 2127 .  ondansetron (ZOFRAN) injection 4 mg, 4 mg, Intravenous, Q6H PRN, Darliss Cheney, MD, 4 mg at 06/23/20 0908 .  pantoprazole sodium (PROTONIX) 40 mg/20 mL oral suspension 40 mg, 40 mg, Per Tube, Q24H, Sood, Vineet, MD, 40 mg at 06/21/20 0930 .  polyethylene glycol (MIRALAX / GLYCOLAX) packet 17 g, 17 g, Per Tube, Daily PRN, Chesley Mires, MD .  Resource ThickenUp Clear, , Oral, PRN, Audria Nine, DO, Given at 06/16/20 0302 .  sodium chloride flush (NS) 0.9 % injection 10-40 mL, 10-40 mL, Intracatheter, Q12H, Chesley Mires, MD, 10 mL at 06/23/20 0853 .  sodium chloride flush (NS) 0.9 % injection 10-40 mL, 10-40 mL, Intracatheter, PRN, Chesley Mires, MD  Patients Current Diet:  Diet Order            DIET DYS 2 Room service appropriate? Yes with Assist; Fluid consistency: Thin  Diet effective now                 Precautions / Restrictions Precautions Precautions: Fall,Other (comment) Precaution  Comments: cortrak Restrictions Weight Bearing Restrictions: No RLE Weight Bearing: Weight bearing as tolerated   Has the patient had 2 or more falls or a fall with injury in the past year? No  Prior Activity Level Limited Community (1-2x/wk): limited since ankle fx and COVID  Prior Functional Level Self Care: Did the patient need help bathing, dressing, using the toilet or eating? Needed some help  Indoor Mobility: Did the patient need assistance with walking from room to room (with or without device)? Needed some help  Stairs: Did the patient need assistance with internal or external stairs (with or without device)? Needed some help  Functional Cognition: Did the patient need help planning regular tasks such as shopping or remembering to take medications? Independent  Home Assistive Devices / Equipment Home Assistive Devices/Equipment: None Home Equipment: Walker - 2 wheels,Wheelchair - manual (reported on 04/2020 thay pt had tub bench and BSC)  Prior Device Use: Indicate devices/aids used by the patient prior to current illness, exacerbation or injury? Walker  Current Functional Level Cognition  Overall Cognitive Status: Impaired/Different from baseline Current Attention Level: Selective Orientation Level: Oriented X4 Following Commands: Follows one step commands consistently Safety/Judgement: Decreased awareness of safety,Decreased awareness of deficits General Comments: Aware of month but does not know if it is the beginning or end of Febuary; very slow processing    Extremity Assessment (includes Sensation/Coordination)  Upper Extremity Assessment: Generalized weakness  Lower Extremity Assessment: Defer to PT evaluation    ADLs  Overall ADL's : Needs assistance/impaired Eating/Feeding: Sitting Eating/Feeding Details (indicate cue type and reason): modified; thickened liquids; pt asking for water Grooming: Set up,Supervision/safety,Sitting Upper Body Bathing: Set  up,Supervision/ safety,Sitting Lower Body Bathing: Moderate assistance,Sit to/from stand Upper Body Dressing : Minimal assistance,Sitting Lower Body Dressing: Moderate assistance,Sit to/from stand Toilet Transfer: Minimal Office manager Details (indicate cue type and reason): +2 simulated to recliner Toileting- Clothing Manipulation and Hygiene: Minimal assistance,Sit to/from stand,Sitting/lateral lean Functional mobility during ADLs: Minimal assistance,Rolling walker,Cueing for safety General ADL Comments: Pt limited by varying levels of arousal and decreased cognition; decreased strength and ability to care for self.    Mobility  Overal bed mobility: Needs Assistance Bed Mobility: Supine to Sit Rolling: Supervision Supine to sit: Min guard,HOB elevated Sit to supine: Min guard General bed mobility comments: Assist for safety and lines. Incr time to perform    Transfers  Overall transfer level:  Needs assistance Equipment used: Rolling walker (2 wheeled) Transfers: Sit to/from Stand Sit to Stand: Min assist Stand pivot transfers: Min guard General transfer comment: Assist for balance. Verbal cues for hand placement.    Ambulation / Gait / Stairs / Wheelchair Mobility  Ambulation/Gait Ambulation/Gait assistance: Editor, commissioning (Feet): 100 Feet Assistive device: Rolling walker (2 wheeled) Gait Pattern/deviations: Step-through pattern,Decreased stride length,Trunk flexed,Wide base of support,Drifts right/left General Gait Details: Assist for balance and support. Pt with tendency to veer lt. Verbal cues for posture. Gait velocity: decr Gait velocity interpretation: 1.31 - 2.62 ft/sec, indicative of limited community ambulator    Posture / Balance Dynamic Sitting Balance Sitting balance - Comments: EOB without UE support Balance Overall balance assessment: Needs assistance Sitting-balance support: No upper extremity supported,Feet supported Sitting  balance-Leahy Scale: Fair Sitting balance - Comments: EOB without UE support Standing balance support: Bilateral upper extremity supported Standing balance-Leahy Scale: Poor Standing balance comment: walker and min guard for static standing    Special needs/care consideration 2/18 43 inch 10 FR left nare Cortrak placed   Previous Home Environment  Living Arrangements: Spouse/significant other,Children  Lives With: Spouse Available Help at Discharge: Family,Available 24 hours/day Type of Home: House Home Layout: One level Home Access: Stairs to enter,Other (comment) Entrance Stairs-Rails: None Entrance Stairs-Number of Steps: 2 Bathroom Shower/Tub: Engineer, manufacturing systems: Standard Bathroom Accessibility: Yes How Accessible: Accessible via walker Home Care Services: No Additional Comments: Information different from previous stay in Jan 2022  Discharge Living Setting Plans for Discharge Living Setting: Patient's home,Lives with (comment) (spouse) Type of Home at Discharge: House Discharge Home Layout: One level Discharge Home Access: Stairs to enter Entrance Stairs-Rails: None Entrance Stairs-Number of Steps: 2 Discharge Bathroom Shower/Tub: Tub/shower unit Discharge Bathroom Toilet: Standard Discharge Bathroom Accessibility: Yes How Accessible: Accessible via walker Does the patient have any problems obtaining your medications?: No  Social/Family/Support Systems Patient Roles: Spouse Contact Information: Brynda [place], spouse Anticipated Caregiver: spouse Anticipated Caregiver's Contact Information: see above Caregiver Availability: 24/7 Discharge Plan Discussed with Primary Caregiver: Yes Is Caregiver In Agreement with Plan?: No Does Caregiver/Family have Issues with Lodging/Transportation while Pt is in Rehab?: Yes  Goals Patient/Family Goal for Rehab: Mod I to supervision with PT, OT, and SLP Expected length of stay: ELOS 10 -14 days Additional Information: no  longer needs hemodialysis Pt/Family Agrees to Admission and willing to participate: Yes Program Orientation Provided & Reviewed with Pt/Caregiver Including Roles  & Responsibilities: Yes  Decrease burden of Care through IP rehab admission: n/a  Possible need for SNF placement upon discharge: not anticipated  Patient Condition: I have reviewed medical records from [facility], spoken with  patient and spouse. I met with patient at the bedside for inpatient rehabilitation assessment.  Patient will benefit from ongoing PT, OT and SLP, can actively participate in 3 hours of therapy a day 5 days of the week, and can make measurable gains during the admission.  Patient will also benefit from the coordinated team approach during an Inpatient Acute Rehabilitation admission.  The patient will receive intensive therapy as well as Rehabilitation physician, nursing, social worker, and care management interventions.  Due to bladder management, bowel management, safety, skin/wound care, disease management, medication administration, pain management and patient education the patient requires 24 hour a day rehabilitation nursing.  The patient is currently min assist overall with mobility and basic ADLs.  Discharge setting and therapy post discharge at home with home health is anticipated.  Patient has agreed to  participate in the Acute Inpatient Rehabilitation Program and will admit today.  Preadmission Screen Completed By:  Cleatrice Burke, 06/23/2020 12:12 PM ______________________________________________________________________   Discussed status with Dr. Dagoberto Ligas on  06/23/2020 at  1212 and received approval for admission today.  Admission Coordinator:  Cleatrice Burke, RN, time  8006 Date  06/23/2020   Assessment/Plan: Diagnosis: 1. Does the need for close, 24 hr/day Medical supervision in concert with the patient's rehab needs make it unreasonable for this patient to be served in a less  intensive setting? Yes 2. Co-Morbidities requiring supervision/potential complications: PTSD- Recent R ankle fx- now WBAT; off dialysis/CRRT, dysphagia, severe depression- on Abilify; ABLA s/p 1 unit pRBCs 3. Due to bladder management, bowel management, safety, skin/wound care, disease management, medication administration, pain management and patient education, does the patient require 24 hr/day rehab nursing? Yes 4. Does the patient require coordinated care of a physician, rehab nurse, PT, OT, and SLP to address physical and functional deficits in the context of the above medical diagnosis(es)? Yes Addressing deficits in the following areas: balance, endurance, locomotion, strength, transferring, bowel/bladder control, bathing, dressing, feeding, grooming, toileting, cognition, speech, language and swallowing 5. Can the patient actively participate in an intensive therapy program of at least 3 hrs of therapy 5 days a week? Yes 6. The potential for patient to make measurable gains while on inpatient rehab is good 7. Anticipated functional outcomes upon discharge from inpatient rehab: modified independent and supervision PT, modified independent and supervision OT, supervision SLP 8. Estimated rehab length of stay to reach the above functional goals is: 10-14 days 9. Anticipated discharge destination: Home 10. Overall Rehab/Functional Prognosis: good   MD Signature:

## 2020-06-21 NOTE — Progress Notes (Signed)
Inpatient Rehabilitation Admissions Coordinator  I met with patient at bedside and spoke with her spouse by phone. We discussed the possibility of an inpt rehab admit pending bed availability this week. They both are in agreement. I do not have a bed today. I will follow up tomorrow.  Danne Baxter, RN, MSN Rehab Admissions Coordinator [phone] 06/21/2020 12:10 PM

## 2020-06-21 NOTE — Progress Notes (Signed)
PROGRESS NOTE  [name] RCV:[id number] DOB: 20-Sep-1970   PCP: Andreas Blower, MD  Patient is from: Home.  DOA: 06/08/2020 LOS: 59  Chief complaints: Cardiac arrest  Brief Narrative / Interim history: 50 year old F with PMH of PTSD, depression, anxiety, HTN, OA on chronic opiate and recent COVID-19 infection brought to ED due to cardiac arrest/asystole with successful ROSC with 3 minutes of CPR, epinephrine and King airway.  She was admitted to ICU for cardiac arrest/shock, acute hypoxemic respiratory failure, aspiration pneumonia, severe acidosis, AKI, hypothermia and hypokalemia.  She was intubated 2/16-2/20. Started on CRRT and transitioned to IHD on 06/15/20.  Received IV Zosyn from 2/16-2/22. Eventually transferred to [facility] service on 2/24 for further care.  Patient had drop in her hemoglobin.  She also had some hematochezia likely from hemorrhoid.  GI consulted.  She was transfused 1 unit with appropriate response, and H&H remained stable.  Therapy recommended CIR  Subjective: Seen and examined.  Complains of nausea and vomiting intermittently since yesterday.  No abdominal pain.  Passing flatus.  Had a bowel movement this morning as well.  Looks comfortable.  Objective: Vitals:   06/20/20 1040 06/20/20 1100 06/20/20 2013 06/21/20 0628  BP:  121/74 123/75 128/77  Pulse: 97  100 (!) 106  Resp: (!) 22  19 20   Temp:   98.6 F (37 C)   TempSrc:   Oral   SpO2: 97%  96% 95%  Weight:    87.9 kg  Height:        Intake/Output Summary (Last 24 hours) at 06/21/2020 1356 Last data filed at 06/21/2020 1324 Gross per 24 hour  Intake --  Output 250 ml  Net -250 ml   Filed Weights   06/20/20 0716 06/20/20 1029 06/21/20 0628  Weight: 86.7 kg 85.6 kg 87.9 kg    Examination: General exam: Appears calm and comfortable, morbidly obese Respiratory system: Clear to auscultation. Respiratory effort normal. Cardiovascular system: S1 & S2 heard, RRR. No JVD, murmurs, rubs,  gallops or clicks.  +1-2 pitting edema bilateral lower extremity. Gastrointestinal system: Abdomen is nondistended, soft and nontender. No organomegaly or masses felt. Normal bowel sounds heard. Central nervous system: Alert and oriented. No focal neurological deficits. Extremities: Symmetric 5 x 5 power. Skin: No rashes, lesions or ulcers.  Psychiatry: Judgement and insight appear poor  Procedures:  ETT 2/16 >> 2/20 Rt IJ HD cath 2/16 >> 2/22 Lt IJ CVL 2/16 >> 2/23  Rt femoral arterial line 2/18 >> 2/21 R Perm Cath (per IR) 2/22 >>  First IHD on 2/23  Microbiology summarized: BCx2 (HD cath) 2/16 >> negative  BCx2 2/16 >> negative  MRSA PCR 2/16 >> positive  Assessment & Plan: Acute hypoxic respiratory failure with compromised airway in setting of cardiac arrest and aspiration pneumonitis/pneumonia: Resolved.  Currently stable on room air. -Evaluated by cardiology, Dr. Gwenlyn Found in ED. TTE without significant finding. -IV Zosyn 2/16-2/22 -Intubated on 2/16 and extubated on 2/20.  -Continue incentive spirometry, OOB/PT/OT  Shock-likely combination of cardiac and septic: POA. UDS was positive for amphetamine on admission but she denies use.  She received epinephrine for resuscitation.  Not sure if this affects the results.  Treated for sepsis as above. TTE without significant finding.  BP stable on midodrine. -Continue midodrine 10 mg 3 times daily for now -Hold home antihypertensive meds.  Anuric AKI/azotemia: Likely ATN in the setting of shock, hypoxia and sepsis.  Received CRRT and transitioned to IHD on 06/15/2020.   -Plan for HD  MWF per nephrology.  Acute toxic and metabolic encephalopathy-due to the above and possible polypharmacy.  Resolved. -Reorientation and delirium precautions -Minimize or avoid sedating medications.  Currently very pleasant and alert and oriented  Hx of depression, PTSD, anxiety and history of prior suicide attempts.  Reportedly, she has been in bed since  November 2021.  She denies suicidal ideation or attempt. -Psych started Abilify and Prozac, recommended outpatient f/up and outpatient substance abuse program -Holding home Lamictal, risperidone, Zanaflex, trazodone and Xanax.  No evidence of withdrawal  Substance use: UDS positive for amphetamines "not prescribed" but she denies use.  -Outpatient substance abuse program  Hyponatremia: Likely due to renal failure.  Improved. -Monitor  Hypokalemia/hypophosphatemia-resolved.  Essential hypertension: Normotensive -Continue holding home lisinopril and HCTZ  Hyperglycemia/prediabetes: A1c 6.1% on 2/16. No results for input(s): HGBA1C in the last 72 hours. Recent Labs  Lab 06/20/20 1625 06/20/20 2008 06/21/20 0021 06/21/20 0737 06/21/20 1140  GLUCAP 129* 173* 200* 113* 130*  -Continue SSI-sensitive scale  Dysphagia-upgraded to dysphagia-2 diet by SLP -Already changing to nocturnal TF  Anemia of critical illness and possible ABLA: small hematochezia in stool the morning of 2/24. H&H stable after 1 unit.   Recent Labs    06/12/20 0258 06/13/20 0328 06/15/20 0414 06/16/20 0423 06/16/20 1401 06/16/20 2143 06/17/20 1015 06/18/20 0201 06/19/20 0303 06/20/20 0147  HGB 9.3* 9.5* 7.4* 7.1* 7.0* 8.7* 9.1* 8.4* 8.7* 8.3*  -Monitor H&H -Transfuse for Hgb less than 7 -Will start on chemical DVT prophylaxis. -GI signed off.  Mild thrombocytopenia, likely from sepsis.  Resolved.  Deconditioning: Due to depression and acute illness. -See plan above -PT / OT-recommended CIR  Recent COVID-19 infection: Tested positive on 1/18.  Had a mild illness and did not require treatment or hospitalization. -Outside the window for isolation  Class I obesity Body mass index is 31.28 kg/m. Nutrition Problem: Inadequate oral intake Etiology: acute illness Signs/Symptoms: NPO status Interventions: Tube feeding   DVT prophylaxis:  heparin injection 5,000 Units Start: 06/19/20 1400 Place  and maintain sequential compression device Start: 06/16/20 1313 Will resume subcu heparin if H&H remains stable tomorrow.  Code Status: Full code Family Communication: None present at bedside. Level of care: Telemetry Medical Status is: Inpatient  Remains inpatient appropriate because:Unsafe d/c plan and Inpatient level of care appropriate due to severity of illness   Dispo: The patient is from: Home              Anticipated d/c is to: CIR, waiting for bed availability.              Patient currently is medically stable to d/c.   Difficult to place patient No   Consultants:  PCCM-signed off Nephrology-following Gastroenterology-signed off   Sch Meds:  Scheduled Meds: . sodium chloride   Intravenous Once  . ARIPiprazole  5 mg Per Tube Daily  . chlorhexidine  15 mL Mouth Rinse BID  . Chlorhexidine Gluconate Cloth  6 each Topical Q0600  . darbepoetin (ARANESP) injection - NON-DIALYSIS  40 mcg Subcutaneous Q Thu-1800  . feeding supplement  237 mL Oral BID BM  . feeding supplement (PROSource TF)  90 mL Per Tube BID  . feeding supplement (VITAL 1.5 CAL)  720 mL Per Tube Q24H  . FLUoxetine  40 mg Per Tube Daily  . heparin injection (subcutaneous)  5,000 Units Subcutaneous Q8H  . insulin aspart  0-9 Units Subcutaneous Q4H  . mouth rinse  15 mL Mouth Rinse q12n4p  . midodrine  10  mg Per Tube TID WC  . multivitamin  1 tablet Per Tube QHS  . pantoprazole sodium  40 mg Per Tube Q24H  . sodium chloride flush  10-40 mL Intracatheter Q12H   Continuous Infusions: . sodium chloride Stopped (06/15/20 1409)   PRN Meds:.acetaminophen, docusate, fentaNYL (SUBLIMAZE) injection, ondansetron (ZOFRAN) IV, polyethylene glycol, Resource ThickenUp Clear, sodium chloride flush  Antimicrobials: Anti-infectives (From admission, onward)   Start     Dose/Rate Route Frequency Ordered Stop   06/15/20 1500  piperacillin-tazobactam (ZOSYN) IVPB 2.25 g  Status:  Discontinued        2.25 g 100 mL/hr  over 30 Minutes Intravenous Every 8 hours 06/15/20 0827 06/15/20 1002   06/13/20 1800  piperacillin-tazobactam (ZOSYN) IVPB 3.375 g  Status:  Discontinued        3.375 g 12.5 mL/hr over 240 Minutes Intravenous Every 12 hours 06/13/20 1114 06/15/20 0827   06/08/20 1800  piperacillin-tazobactam (ZOSYN) IVPB 3.375 g  Status:  Discontinued        3.375 g 100 mL/hr over 30 Minutes Intravenous Every 6 hours 06/08/20 1608 06/13/20 1114       I have personally reviewed the following labs and images: CBC: Recent Labs  Lab 06/15/20 0414 06/16/20 0423 06/16/20 1401 06/16/20 2143 06/17/20 1015 06/18/20 0201 06/19/20 0303 06/20/20 0147  WBC 9.1 7.7  --   --  10.3  --   --  7.3  HGB 7.4* 7.1*   < > 8.7* 9.1* 8.4* 8.7* 8.3*  HCT 22.8* 22.5*   < > 26.3* 27.7* 25.8* 26.9* 24.3*  MCV 96.6 97.8  --   --  92.0  --   --  93.5  PLT 152 189  --   --  295  --   --  369   < > = values in this interval not displayed.   BMP &GFR Recent Labs  Lab 06/15/20 0414 06/16/20 0423 06/17/20 1015 06/18/20 0152 06/19/20 0303 06/20/20 0147 06/21/20 0226  NA 136   < > 130* 132* 134* 136 138  K 5.1   < > 4.3 3.6 3.7 3.6 3.8  CL 96*   < > 93* 96* 97* 99 99  CO2 22   < > 22 25 24 23 25   GLUCOSE 205*   < > 141* 196* 183* 194* 175*  BUN 90*   < > 64* 30* 49* 57* 23*  CREATININE 4.93*   < > 4.34* 2.55* 3.60* 3.80* 2.20*  CALCIUM 8.2*   < > 8.9 8.4* 8.7* 8.8* 8.8*  MG 2.2  --  1.9  --  1.9  --   --   PHOS 4.4   < > 4.1 3.5 5.0* 5.7* 4.0   < > = values in this interval not displayed.   Estimated Creatinine Clearance: 34.5 mL/min (A) (by C-G formula based on SCr of 2.2 mg/dL (H)). Liver & Pancreas: Recent Labs  Lab 06/16/20 0423 06/17/20 1015 06/18/20 0152 06/19/20 0303 06/20/20 0147 06/21/20 0226  AST 40  --   --   --   --   --   ALT 42  --   --   --   --   --   ALKPHOS 96  --   --   --   --   --   BILITOT 0.7  --   --   --   --   --   PROT 5.7*  --   --   --   --   --  ALBUMIN 2.3* 2.3* 2.2*  2.3* 2.3* 2.3*   No results for input(s): LIPASE, AMYLASE in the last 168 hours. No results for input(s): AMMONIA in the last 168 hours. Diabetic: No results for input(s): HGBA1C in the last 72 hours. Recent Labs  Lab 06/20/20 1625 06/20/20 2008 06/21/20 0021 06/21/20 0737 06/21/20 1140  GLUCAP 129* 173* 200* 113* 130*   Cardiac Enzymes: No results for input(s): CKTOTAL, CKMB, CKMBINDEX, TROPONINI in the last 168 hours. No results for input(s): PROBNP in the last 8760 hours. Coagulation Profile: No results for input(s): INR, PROTIME in the last 168 hours. Thyroid Function Tests: No results for input(s): TSH, T4TOTAL, FREET4, T3FREE, THYROIDAB in the last 72 hours. Lipid Profile: No results for input(s): CHOL, HDL, LDLCALC, TRIG, CHOLHDL, LDLDIRECT in the last 72 hours. Anemia Panel: No results for input(s): VITAMINB12, FOLATE, FERRITIN, TIBC, IRON, RETICCTPCT in the last 72 hours. Urine analysis:    Component Value Date/Time   COLORURINE AMBER (A) 06/08/2020 1512   APPEARANCEUR CLOUDY (A) 06/08/2020 1512   LABSPEC 1.023 06/08/2020 1512   PHURINE 5.0 06/08/2020 1512   GLUCOSEU NEGATIVE 06/08/2020 1512   HGBUR NEGATIVE 06/08/2020 [place] 06/08/2020 [place] 06/08/2020 1512   PROTEINUR NEGATIVE 06/08/2020 1512   UROBILINOGEN 0.2 01/19/2015 0302   NITRITE NEGATIVE 06/08/2020 [place] 06/08/2020 1512   Sepsis Labs: Invalid input(s): PROCALCITONIN, [place]  Microbiology: No results found for this or any previous visit (from the past 240 hour(s)).  Radiology Studies: No results found.   Darliss Cheney, MD [facility]  If 7PM-7AM, please contact night-coverage [email or web address] 06/21/2020, 1:56 PM

## 2020-06-21 NOTE — Progress Notes (Signed)
SLP Cancellation Note  Patient Details Name: [name] MRN: [id number] DOB: 07/01/1970   Cancelled treatment:       Reason Eval/Treat Not Completed: Medical issues which prohibited therapy (pt too nauseous at the moment per RN). Will f/u as able.    Osie Bond., M.A. [place] [facility] Pager [phone] Office [phone]  06/21/2020, 11:31 AM

## 2020-06-21 NOTE — Plan of Care (Signed)

## 2020-06-21 NOTE — Progress Notes (Signed)
Patient ID: [name], female   DOB: 15-Sep-1970, 50 y.o.   MRN: [id number] S: No complaints.  Family at bedside and husband reports increased UOP although it is not being collected.  She has also had some urinary incontinence. O:BP 111/77 (BP Location: Left Arm)   Pulse (!) 109   Temp 99.7 F (37.6 C) (Oral)   Resp 17   Ht 5\' 6"  (1.676 m)   Wt 87.9 kg   LMP 06/03/2018 (Approximate) Comment: neg hcg 05/10/20  SpO2 97%   BMI 31.28 kg/m   Intake/Output Summary (Last 24 hours) at 06/21/2020 1549 Last data filed at 06/21/2020 1324 Gross per 24 hour  Intake --  Output 250 ml  Net -250 ml   Intake/Output: I/O last 3 completed shifts: In: 0  Out: 1000 [Other:1000]  Intake/Output this shift:  Total I/O In: -  Out: 250 [Urine:250] Weight change:  Gen: NAD CVS: RRR Resp: CTA Abd: +BS, soft, NT/ND Ext: no edema  Recent Labs  Lab 06/15/20 0414 06/16/20 0423 06/17/20 1015 06/18/20 0152 06/19/20 0303 06/20/20 0147 06/21/20 0226  NA 136 133* 130* 132* 134* 136 138  K 5.1 4.0 4.3 3.6 3.7 3.6 3.8  CL 96* 95* 93* 96* 97* 99 99  CO2 22 25 22 25 24 23 25   GLUCOSE 205* 194* 141* 196* 183* 194* 175*  BUN 90* 38* 64* 30* 49* 57* 23*  CREATININE 4.93* 2.76* 4.34* 2.55* 3.60* 3.80* 2.20*  ALBUMIN 1.9* 2.3* 2.3* 2.2* 2.3* 2.3* 2.3*  CALCIUM 8.2* 8.3* 8.9 8.4* 8.7* 8.8* 8.8*  PHOS 4.4 3.1 4.1 3.5 5.0* 5.7* 4.0  AST  --  40  --   --   --   --   --   ALT  --  42  --   --   --   --   --    Liver Function Tests: Recent Labs  Lab 06/16/20 0423 06/17/20 1015 06/19/20 0303 06/20/20 0147 06/21/20 0226  AST 40  --   --   --   --   ALT 42  --   --   --   --   ALKPHOS 96  --   --   --   --   BILITOT 0.7  --   --   --   --   PROT 5.7*  --   --   --   --   ALBUMIN 2.3*   < > 2.3* 2.3* 2.3*   < > = values in this interval not displayed.   No results for input(s): LIPASE, AMYLASE in the last 168 hours. No results for input(s): AMMONIA in the last 168 hours. CBC: Recent Labs  Lab  06/15/20 0414 06/16/20 0423 06/16/20 1401 06/17/20 1015 06/18/20 0201 06/19/20 0303 06/20/20 0147  WBC 9.1 7.7  --  10.3  --   --  7.3  HGB 7.4* 7.1*   < > 9.1* 8.4* 8.7* 8.3*  HCT 22.8* 22.5*   < > 27.7* 25.8* 26.9* 24.3*  MCV 96.6 97.8  --  92.0  --   --  93.5  PLT 152 189  --  295  --   --  369   < > = values in this interval not displayed.   Cardiac Enzymes: No results for input(s): CKTOTAL, CKMB, CKMBINDEX, TROPONINI in the last 168 hours. CBG: Recent Labs  Lab 06/20/20 1625 06/20/20 2008 06/21/20 0021 06/21/20 0737 06/21/20 1140  GLUCAP 129* 173* 200* 113* 130*    Iron  Studies: No results for input(s): IRON, TIBC, TRANSFERRIN, FERRITIN in the last 72 hours. Studies/Results: No results found. . sodium chloride   Intravenous Once  . ARIPiprazole  5 mg Per Tube Daily  . chlorhexidine  15 mL Mouth Rinse BID  . Chlorhexidine Gluconate Cloth  6 each Topical Q0600  . darbepoetin (ARANESP) injection - NON-DIALYSIS  40 mcg Subcutaneous Q Thu-1800  . feeding supplement  237 mL Oral BID BM  . feeding supplement (PROSource TF)  90 mL Per Tube BID  . feeding supplement (VITAL 1.5 CAL)  720 mL Per Tube Q24H  . FLUoxetine  40 mg Per Tube Daily  . heparin injection (subcutaneous)  5,000 Units Subcutaneous Q8H  . insulin aspart  0-9 Units Subcutaneous Q4H  . mouth rinse  15 mL Mouth Rinse q12n4p  . midodrine  10 mg Per Tube TID WC  . multivitamin  1 tablet Per Tube QHS  . pantoprazole sodium  40 mg Per Tube Q24H  . sodium chloride flush  10-40 mL Intracatheter Q12H    BMET    Component Value Date/Time   NA 138 06/21/2020 0226   K 3.8 06/21/2020 0226   CL 99 06/21/2020 0226   CO2 25 06/21/2020 0226   GLUCOSE 175 (H) 06/21/2020 0226   BUN 23 (H) 06/21/2020 0226   CREATININE 2.20 (H) 06/21/2020 0226   CALCIUM 8.8 (L) 06/21/2020 0226   GFRNONAA 27 (L) 06/21/2020 0226   GFRAA >60 05/05/2019 1207   CBC    Component Value Date/Time   WBC 7.3 06/20/2020 0147   RBC 2.60  (L) 06/20/2020 0147   HGB 8.3 (L) 06/20/2020 0147   HCT 24.3 (L) 06/20/2020 0147   PLT 369 06/20/2020 0147   MCV 93.5 06/20/2020 0147   MCH 31.9 06/20/2020 0147   MCHC 34.2 06/20/2020 0147   RDW 14.6 06/20/2020 0147   LYMPHSABS 1.1 05/12/2020 0042   MONOABS 0.6 05/12/2020 0042   EOSABS 0.3 05/12/2020 0042   BASOSABS 0.0 05/12/2020 0042    Assessment/Plan: 1. AKI, oliguric- presumably due to ischemic ATN following cardiac arrest.  Started on CRRT 06/08/20 then stopped 2/21 and transitioned to IHD on 06/15/20.   1. Starting to have increased UOP and not having complete collections due to incontinence. 2. Recheck labs tomorrow and hold off on further HD for now to see if she is having renal recovery. 2. Asystolic cardiac arrest s/p CPR with ROSC 3. Acute hypoxic respiratory failure- improved 4. Shock/hypotension - possible aspiration pna.  Now improved 5. Vascular access- s/p RIJ TDC placed 06/14/20. 6. Hematochezia- GI following. 7. Disposition- PT/OT working with patient.  If going to CIR will continue with IHD and follow for renal recovery, otherwise will need outpatient dialysis (AKI)   Donetta Potts, MD [facility] [phone]

## 2020-06-21 NOTE — Progress Notes (Signed)
PT Cancellation Note  Patient Details Name: [name] MRN: [id number] DOB: Mar 28, 1971   Cancelled Treatment:    Reason Eval/Treat Not Completed: Other (comment). Pt now sleeping soundly after being nauseous earlier.    [place] 06/21/2020, 2:51 PM Alisi Lupien [place] Pager [phone] Office [phone]

## 2020-06-22 DIAGNOSIS — J9601 Acute respiratory failure with hypoxia: Secondary | ICD-10-CM | POA: Diagnosis not present

## 2020-06-22 LAB — GLUCOSE, CAPILLARY
Glucose-Capillary: 105 mg/dL — ABNORMAL HIGH (ref 70–99)
Glucose-Capillary: 120 mg/dL — ABNORMAL HIGH (ref 70–99)
Glucose-Capillary: 133 mg/dL — ABNORMAL HIGH (ref 70–99)
Glucose-Capillary: 137 mg/dL — ABNORMAL HIGH (ref 70–99)
Glucose-Capillary: 140 mg/dL — ABNORMAL HIGH (ref 70–99)
Glucose-Capillary: 144 mg/dL — ABNORMAL HIGH (ref 70–99)
Glucose-Capillary: 152 mg/dL — ABNORMAL HIGH (ref 70–99)

## 2020-06-22 LAB — RENAL FUNCTION PANEL
Albumin: 2.5 g/dL — ABNORMAL LOW (ref 3.5–5.0)
Anion gap: 13 (ref 5–15)
BUN: 24 mg/dL — ABNORMAL HIGH (ref 6–20)
CO2: 24 mmol/L (ref 22–32)
Calcium: 9 mg/dL (ref 8.9–10.3)
Chloride: 102 mmol/L (ref 98–111)
Creatinine, Ser: 2.24 mg/dL — ABNORMAL HIGH (ref 0.44–1.00)
GFR, Estimated: 26 mL/min — ABNORMAL LOW (ref >60)
Glucose, Bld: 154 mg/dL — ABNORMAL HIGH (ref 70–99)
Phosphorus: 4.6 mg/dL (ref 2.5–4.6)
Potassium: 3.4 mmol/L — ABNORMAL LOW (ref 3.5–5.1)
Sodium: 139 mmol/L (ref 135–145)

## 2020-06-22 LAB — DRUG PROFILE, UR, 9 DRUGS (LABCORP)
Amphetamines, Urine: NEGATIVE ng/mL
Barbiturate, Ur: NEGATIVE ng/mL
Benzodiazepine Quant, Ur: NEGATIVE ng/mL
Cannabinoid Quant, Ur: NEGATIVE ng/mL
Cocaine (Metab.): NEGATIVE ng/mL
Methadone Screen, Urine: NEGATIVE ng/mL
Opiate Quant, Ur: NEGATIVE ng/mL
Phencyclidine, Ur: NEGATIVE ng/mL
Propoxyphene, Urine: NEGATIVE ng/mL

## 2020-06-22 MED ORDER — PROSOURCE TF PO LIQD
45.0000 mL | Freq: Four times a day (QID) | ORAL | Status: DC
  Administered 2020-06-22 – 2020-06-23 (×3): 45 mL
  Filled 2020-06-22 (×6): qty 45

## 2020-06-22 MED ORDER — VITAL 1.5 CAL PO LIQD
1000.0000 mL | ORAL | Status: DC
  Administered 2020-06-22: 1000 mL
  Filled 2020-06-22 (×3): qty 1000

## 2020-06-22 MED ORDER — METOCLOPRAMIDE HCL 5 MG/ML IJ SOLN
5.0000 mg | Freq: Three times a day (TID) | INTRAMUSCULAR | Status: DC
  Administered 2020-06-22 – 2020-06-23 (×3): 5 mg via INTRAVENOUS
  Filled 2020-06-22 (×3): qty 2

## 2020-06-22 MED ORDER — METOCLOPRAMIDE HCL 5 MG PO TABS: 5.0000 mg | ORAL_TABLET | Freq: Three times a day (TID) | ORAL | Status: DC

## 2020-06-22 NOTE — Plan of Care (Signed)

## 2020-06-22 NOTE — Progress Notes (Signed)
Nutrition Follow-up  DOCUMENTATION CODES:   Not applicable  INTERVENTION:   Tube Feeding via Cortrak: transition to continuous TF x 24 h/day: Vital 1.5 at 40 ml/hr (960 ml daily) Pro-Source TF 45 mL QID  Provides 1600 kcal (84% of minimum estimated needs), 109 gm protein (100% of minimum estimated needs), 733 ml free water daily.  D/C Ensure Enlive po BID, patient is refusing to drink.  Continue Rena-Vite daily by mouth.  Magic cup TID with meals, each supplement provides 290 kcal and 9 grams of protein  Vital Cuisine Shake BID, each supplement provides 520 kcal and 22 grams of protein  NUTRITION DIAGNOSIS:   Inadequate oral intake related to acute illness as evidenced by NPO status.  Ongoing   GOAL:   Patient will meet greater than or equal to 90% of their needs  Progressing  MONITOR:   PO intake,Supplement acceptance,Labs,Weight trends  REASON FOR ASSESSMENT:   Consult,Ventilator Enteral/tube feeding initiation and management  ASSESSMENT:   50 yo female admitted post cardiac arrest likely from opiate/amphetamine overdose requiring intubation, AKI from ATN requiring CRRT. PMH includes PTSD, depression, HTN, HLD, COVID-19 infection (04/2020)  Renal function is improving; holding off on HD at this time.  Receiving nocturnal tube feeds via Cortrak tube, c/o nausea after medications are given via tube. Per discussion with RN, tube feeding rate was reduced to 40 ml/h and held multiple times last night. She was tolerating TF well at 40 ml/h. Discussed with MD. Will change TF back to continuous 24 h/day at a lower rate for improved tolerance.   Appetite remains poor. Patient is eating minimal amounts of meals. She has been refusing the Ensure supplements. Spouse at bedside says she has made up her mind that she doesn't like the Ensure and he doesn't want to push it on her. RD will D/C Ensure. Will add magic cup supplements with meals.  Labs reviewed. K 3.4 CBG:  144-140-120-105  Medications reviewed and include Aranesp, Novolog, Rena-vit. Reglan added TID today.   Diet Order:   Diet Order            DIET DYS 2 Room service appropriate? Yes with Assist; Fluid consistency: Thin  Diet effective now                 EDUCATION NEEDS:   Not appropriate for education at this time  Skin:  Skin Assessment: Reviewed RN Assessment  Last BM:  3/2 type 7  Height:   Ht Readings from Last 1 Encounters:  06/08/20 5\' 6"  (1.676 m)    Weight:   Wt Readings from Last 1 Encounters:  06/22/20 86.4 kg    BMI:  Body mass index is 30.74 kg/m.  Estimated Nutritional Needs:   Kcal:  1900-2100  Protein:  105-120 g  Fluid:  >/= 1.8 L   Lucas Mallow, RD, LDN, CNSC Please refer to Amion for contact information.

## 2020-06-22 NOTE — Progress Notes (Signed)
Inpatient Rehabilitation Admissions Coordinator  I do not have a CIR bed to admit patient to today.  Danne Baxter, RN, MSN Rehab Admissions Coordinator [phone] 06/22/2020 12:30 PM

## 2020-06-22 NOTE — Progress Notes (Signed)
OT Cancellation Note  Patient Details Name: OCEANNA ARRUDA MRN: [id number] DOB: 02/22/1971   Cancelled Treatment:    Reason Eval/Treat Not Completed: Patient declined, no reason specified;Other (comment) pt with visitors present declining OT session. Will check bask as time allows for OT session.  Corinne Ports K., COTA/L Acute Rehabilitation Services [phone]   Precious Haws 06/22/2020, 3:59 PM

## 2020-06-22 NOTE — Care Management Important Message (Signed)
Important Message  Patient Details  Name: AERICA [name] MRN: [id number] Date of Birth: 12-26-1970   Medicare Important Message Given:  Yes     Shelda Altes 06/22/2020, 11:19 AM

## 2020-06-22 NOTE — Progress Notes (Signed)
PROGRESS NOTE  [name] DGL:[id number] DOB: [birth year]   PCP: Andreas Blower, MD  Patient is from: Home.  DOA: 06/08/2020 LOS: 41  Chief complaints: Cardiac arrest  Brief Narrative / Interim history: 50 year old F with PMH of PTSD, depression, anxiety, HTN, OA on chronic opiate and recent COVID-19 infection brought to ED due to cardiac arrest/asystole with successful ROSC with 3 minutes of CPR, epinephrine and King airway.  She was admitted to ICU for cardiac arrest/shock, acute hypoxemic respiratory failure, aspiration pneumonia, severe acidosis, AKI, hypothermia and hypokalemia.  She was intubated 2/16-2/20. Started on CRRT and transitioned to IHD on 06/15/20.  Received IV Zosyn from 2/16-2/22. Eventually transferred to [facility] service on 2/24 for further care.  Patient had drop in her hemoglobin.  She also had some hematochezia likely from hemorrhoid.  GI consulted.  She was transfused 1 unit with appropriate response, and H&H remained stable.  Therapy recommended CIR  Subjective: Seen and examined.  Husband at the bedside.  Feels better.  Still with nausea but no more vomiting.  Having bowel movements and passing flatus.  No abdominal pain.  Objective: Vitals:   06/22/20 0500 06/22/20 0600 06/22/20 0750 06/22/20 1111  BP: 129/81  (!) 140/94 134/86  Pulse:   (!) 110 (!) 107  Resp: 17 18 19 20   Temp:   98.4 F (36.9 C) 97.8 F (36.6 C)  TempSrc:   Oral Oral  SpO2:   97% 96%  Weight:      Height:        Intake/Output Summary (Last 24 hours) at 06/22/2020 1520 Last data filed at 06/22/2020 0557 Gross per 24 hour  Intake 1304 ml  Output --  Net 1304 ml   Filed Weights   06/20/20 1029 06/21/20 0628 06/22/20 0457  Weight: 85.6 kg 87.9 kg 86.4 kg    Examination: General exam: Appears calm and comfortable, morbidly obese Respiratory system: Clear to auscultation. Respiratory effort normal. Cardiovascular system: S1 & S2 heard, RRR. No JVD, murmurs, rubs, gallops or  clicks.  +2 pitting edema bilateral lower extremity Gastrointestinal system: Abdomen is nondistended, soft and nontender. No organomegaly or masses felt. Normal bowel sounds heard. Central nervous system: Alert and oriented. No focal neurological deficits. Extremities: Symmetric 5 x 5 power. Skin: No rashes, lesions or ulcers.  Psychiatry: Judgement and insight appear poor, mood and affect flat   Procedures:  ETT 2/16 >> 2/20 Rt IJ HD cath 2/16 >> 2/22 Lt IJ CVL 2/16 >> 2/23  Rt femoral arterial line 2/18 >> 2/21 R Perm Cath (per IR) 2/22 >>  First IHD on 2/23  Microbiology summarized: BCx2 (HD cath) 2/16 >> negative  BCx2 2/16 >> negative  MRSA PCR 2/16 >> positive  Assessment & Plan: Acute hypoxic respiratory failure with compromised airway in setting of cardiac arrest and aspiration pneumonitis/pneumonia: Resolved.  Currently stable on room air. -Evaluated by cardiology, Dr. Gwenlyn Found in ED. TTE without significant finding. -IV Zosyn 2/16-2/22 -Intubated on 2/16 and extubated on 2/20.  -Continue incentive spirometry, OOB/PT/OT  Nausea/vomiting.  No more vomiting.  Continues to have intermittent nausea.  Her tube feedings were stopped intermittently and reduced in rate multiple times by nursing staff overnight.  Likely due to her immobilization.  Encouraged her to mobilize more.  Discussed with ID, we are going to reduce her rate and increase the hours and make it 24/7 instead of overnight tube feedings.  Will place her on scheduled 3 times daily Reglan.  Continue as needed Zofran.  Shock-likely combination of cardiac and septic: POA. UDS was positive for amphetamine on admission but she denies use.  She received epinephrine for resuscitation.  Not sure if this affects the results.  Treated for sepsis as above. TTE without significant finding.  BP stable on midodrine. -Continue midodrine 10 mg 3 times daily for now -Hold home antihypertensive meds.  Anuric AKI/azotemia: Likely ATN in  the setting of shock, hypoxia and sepsis.  Received CRRT and transitioned to IHD on 06/15/2020.   -Plan for HD MWF per nephrology.  Mild hypokalemia: 3.4.  Will defer to nephrology since she is a dialysis patient.  Acute toxic and metabolic encephalopathy-due to the above and possible polypharmacy.  Resolved. -Reorientation and delirium precautions -Minimize or avoid sedating medications.  Currently very pleasant and alert and oriented  Hx of depression, PTSD, anxiety and history of prior suicide attempts.  Reportedly, she has been in bed since November 2021.  She denies suicidal ideation or attempt. -Psych started Abilify and Prozac, recommended outpatient f/up and outpatient substance abuse program -Holding home Lamictal, risperidone, Zanaflex, trazodone and Xanax.  No evidence of withdrawal  Substance use: UDS positive for amphetamines "not prescribed" but she denies use.  -Outpatient substance abuse program  Hyponatremia: Likely due to renal failure.  Improved. -Monitor  Hypokalemia/hypophosphatemia-resolved.  Essential hypertension: Normotensive -Continue holding home lisinopril and HCTZ  Hyperglycemia/prediabetes: A1c 6.1% on 2/16. No results for input(s): HGBA1C in the last 72 hours. Recent Labs  Lab 06/21/20 2113 06/22/20 0240 06/22/20 0455 06/22/20 0748 06/22/20 1109  GLUCAP 149* 152* 144* 140* 120*  -Continue SSI-sensitive scale  Dysphagia-upgraded to dysphagia-2 diet by SLP -Already changing to nocturnal TF  Anemia of critical illness and possible ABLA: small hematochezia in stool the morning of 2/24. H&H stable after 1 unit.   Recent Labs    06/12/20 0258 06/13/20 0328 06/15/20 0414 06/16/20 0423 06/16/20 1401 06/16/20 2143 06/17/20 1015 06/18/20 0201 06/19/20 0303 06/20/20 0147  HGB 9.3* 9.5* 7.4* 7.1* 7.0* 8.7* 9.1* 8.4* 8.7* 8.3*  -Monitor H&H -Transfuse for Hgb less than 7 -Will start on chemical DVT prophylaxis. -GI signed off.  Mild  thrombocytopenia, likely from sepsis.  Resolved.  Deconditioning: Due to depression and acute illness. -See plan above -PT / OT-recommended CIR  Recent COVID-19 infection: Tested positive on 1/18.  Had a mild illness and did not require treatment or hospitalization. -Outside the window for isolation  Class I obesity Body mass index is 30.74 kg/m. Nutrition Problem: Inadequate oral intake Etiology: acute illness Signs/Symptoms: NPO status Interventions: Tube feeding   DVT prophylaxis:  heparin injection 5,000 Units Start: 06/19/20 1400 Place and maintain sequential compression device Start: 06/16/20 1313 Will resume subcu heparin if H&H remains stable tomorrow.  Code Status: Full code Family Communication: None present at bedside. Level of care: Telemetry Medical Status is: Inpatient  Remains inpatient appropriate because:Unsafe d/c plan and Inpatient level of care appropriate due to severity of illness   Dispo: The patient is from: Home              Anticipated d/c is to: CIR, waiting for bed availability.              Patient currently is medically stable to d/c.   Difficult to place patient No   Consultants:  PCCM-signed off Nephrology-following Gastroenterology-signed off   Sch Meds:  Scheduled Meds: . sodium chloride   Intravenous Once  . ARIPiprazole  5 mg Per Tube Daily  . chlorhexidine  15 mL Mouth Rinse  BID  . Chlorhexidine Gluconate Cloth  6 each Topical Q0600  . darbepoetin (ARANESP) injection - NON-DIALYSIS  40 mcg Subcutaneous Q Thu-1800  . feeding supplement  237 mL Oral BID BM  . feeding supplement (PROSource TF)  90 mL Per Tube BID  . feeding supplement (VITAL 1.5 CAL)  720 mL Per Tube Q24H  . FLUoxetine  40 mg Per Tube Daily  . heparin injection (subcutaneous)  5,000 Units Subcutaneous Q8H  . insulin aspart  0-9 Units Subcutaneous Q4H  . mouth rinse  15 mL Mouth Rinse q12n4p  . metoCLOPramide (REGLAN) injection  5 mg Intravenous Q8H  .  midodrine  10 mg Per Tube TID WC  . multivitamin  1 tablet Per Tube QHS  . pantoprazole sodium  40 mg Per Tube Q24H  . sodium chloride flush  10-40 mL Intracatheter Q12H   Continuous Infusions: . sodium chloride Stopped (06/15/20 1409)   PRN Meds:.acetaminophen, docusate, fentaNYL (SUBLIMAZE) injection, ondansetron (ZOFRAN) IV, polyethylene glycol, Resource ThickenUp Clear, sodium chloride flush  Antimicrobials: Anti-infectives (From admission, onward)   Start     Dose/Rate Route Frequency Ordered Stop   06/15/20 1500  piperacillin-tazobactam (ZOSYN) IVPB 2.25 g  Status:  Discontinued        2.25 g 100 mL/hr over 30 Minutes Intravenous Every 8 hours 06/15/20 0827 06/15/20 1002   06/13/20 1800  piperacillin-tazobactam (ZOSYN) IVPB 3.375 g  Status:  Discontinued        3.375 g 12.5 mL/hr over 240 Minutes Intravenous Every 12 hours 06/13/20 1114 06/15/20 0827   06/08/20 1800  piperacillin-tazobactam (ZOSYN) IVPB 3.375 g  Status:  Discontinued        3.375 g 100 mL/hr over 30 Minutes Intravenous Every 6 hours 06/08/20 1608 06/13/20 1114       I have personally reviewed the following labs and images: CBC: Recent Labs  Lab 06/16/20 0423 06/16/20 1401 06/16/20 2143 06/17/20 1015 06/18/20 0201 06/19/20 0303 06/20/20 0147  WBC 7.7  --   --  10.3  --   --  7.3  HGB 7.1*   < > 8.7* 9.1* 8.4* 8.7* 8.3*  HCT 22.5*   < > 26.3* 27.7* 25.8* 26.9* 24.3*  MCV 97.8  --   --  92.0  --   --  93.5  PLT 189  --   --  295  --   --  369   < > = values in this interval not displayed.   BMP &GFR Recent Labs  Lab 06/17/20 1015 06/18/20 0152 06/19/20 0303 06/20/20 0147 06/21/20 0226 06/22/20 0248  NA 130* 132* 134* 136 138 139  K 4.3 3.6 3.7 3.6 3.8 3.4*  CL 93* 96* 97* 99 99 102  CO2 22 25 24 23 25 24   GLUCOSE 141* 196* 183* 194* 175* 154*  BUN 64* 30* 49* 57* 23* 24*  CREATININE 4.34* 2.55* 3.60* 3.80* 2.20* 2.24*  CALCIUM 8.9 8.4* 8.7* 8.8* 8.8* 9.0  MG 1.9  --  1.9  --   --   --    PHOS 4.1 3.5 5.0* 5.7* 4.0 4.6   Estimated Creatinine Clearance: 33.6 mL/min (A) (by C-G formula based on SCr of 2.24 mg/dL (H)). Liver & Pancreas: Recent Labs  Lab 06/16/20 0423 06/17/20 1015 06/18/20 0152 06/19/20 0303 06/20/20 0147 06/21/20 0226 06/22/20 0248  AST 40  --   --   --   --   --   --   ALT 42  --   --   --   --   --   --  ALKPHOS 96  --   --   --   --   --   --   BILITOT 0.7  --   --   --   --   --   --   PROT 5.7*  --   --   --   --   --   --   ALBUMIN 2.3*   < > 2.2* 2.3* 2.3* 2.3* 2.5*   < > = values in this interval not displayed.   No results for input(s): LIPASE, AMYLASE in the last 168 hours. No results for input(s): AMMONIA in the last 168 hours. Diabetic: No results for input(s): HGBA1C in the last 72 hours. Recent Labs  Lab 06/21/20 2113 06/22/20 0240 06/22/20 0455 06/22/20 0748 06/22/20 1109  GLUCAP 149* 152* 144* 140* 120*   Cardiac Enzymes: No results for input(s): CKTOTAL, CKMB, CKMBINDEX, TROPONINI in the last 168 hours. No results for input(s): PROBNP in the last 8760 hours. Coagulation Profile: No results for input(s): INR, PROTIME in the last 168 hours. Thyroid Function Tests: No results for input(s): TSH, T4TOTAL, FREET4, T3FREE, THYROIDAB in the last 72 hours. Lipid Profile: No results for input(s): CHOL, HDL, LDLCALC, TRIG, CHOLHDL, LDLDIRECT in the last 72 hours. Anemia Panel: No results for input(s): VITAMINB12, FOLATE, FERRITIN, TIBC, IRON, RETICCTPCT in the last 72 hours. Urine analysis:    Component Value Date/Time   COLORURINE AMBER (A) 06/08/2020 1512   APPEARANCEUR CLOUDY (A) 06/08/2020 1512   LABSPEC 1.023 06/08/2020 1512   PHURINE 5.0 06/08/2020 1512   GLUCOSEU NEGATIVE 06/08/2020 1512   HGBUR NEGATIVE 06/08/2020 [place] 06/08/2020 [place] 06/08/2020 1512   PROTEINUR NEGATIVE 06/08/2020 1512   UROBILINOGEN 0.2 01/19/2015 0302   NITRITE NEGATIVE 06/08/2020 [place] 06/08/2020 1512   Sepsis Labs: Invalid input(s): PROCALCITONIN, [place]  Microbiology: No results found for this or any previous visit (from the past 240 hour(s)).  Radiology Studies: No results found.   Darliss Cheney, MD [facility]  If 7PM-7AM, please contact night-coverage [email or web address] 06/22/2020, 3:20 PM

## 2020-06-22 NOTE — Progress Notes (Signed)
PT Cancellation Note  Patient Details Name: [name] MRN: [id number] DOB: Dec 29, 1970   Cancelled Treatment:    Reason Eval/Treat Not Completed: (P) Patient declined, no reason specified. Pt with spouse and son in room, reports she is not feeling up to therapy this afternoon, amenable to HEP handout and pt encouraged to perform supine exercise for strengthening as tolerated. Will continue efforts per PT POC next date as schedule permits.  Nayana Lenig M Lavette Yankovich 06/22/2020, 4:30 PM

## 2020-06-22 NOTE — Progress Notes (Signed)
  Speech Language Pathology Treatment: Dysphagia  Patient Details Name: [name] MRN: [id number] DOB: 09/26/1970 Today's Date: 06/22/2020 Time: 4098-1191 SLP Time Calculation (min) (ACUTE ONLY): 10 min  Assessment / Plan / Recommendation Clinical Impression  Pt was agreeable to thin liquids only today. She reports that she has her dentures available now, but that she hasn't been wanting to wear them. Not only does she decline solid trials, but she has a hard time clarifying if she would want to advance from her current diet. She has no overt s/s of aspiration with thin liquids, whether consumed via cup or straw. Will maintain Dys 2 diet but with advancement to thin liquids. Anticipate that she is likely able to advance her solids as well, but if she is not wearing her dentures, will defer advancement to her preference for now.   HPI HPI: [name] who apparently fell out of bed during the night and was found to be in asystole, and she was intubated and underwent chest compressions as well as EPI with ROSC.  Pt was admitted for COVID-19 in mid January.  Pt was intubated from 2/16 - 2/20.      SLP Plan  Continue with current plan of care       Recommendations  Diet recommendations: Dysphagia 2 (fine chop);Thin liquid Liquids provided via: Cup;Straw Medication Administration: Whole meds with puree Supervision: Patient able to self feed;Full supervision/cueing for compensatory strategies Compensations: Slow rate;Small sips/bites;Minimize environmental distractions Postural Changes and/or Swallow Maneuvers: Seated upright 90 degrees;Upright 30-60 min after meal                Oral Care Recommendations: Oral care BID;Oral care prior to ice chip/H20 Follow up Recommendations: Inpatient Rehab SLP Visit Diagnosis: Dysphagia, oropharyngeal phase (R13.12) Plan: Continue with current plan of care       GO                Osie Bond., M.A. [place] [facility] Pager [phone] Office [phone]  06/22/2020, 1:47 PM

## 2020-06-22 NOTE — Progress Notes (Signed)
Patient ID: [name], female   DOB: 1971-03-29, 50 y.o.   MRN: [id number] S: No new complaints and reports ongoing incontinence.   O:BP 134/86 (BP Location: Left Arm)   Pulse (!) 107   Temp 97.8 F (36.6 C) (Oral)   Resp 20   Ht 5\' 6"  (1.676 m)   Wt 86.4 kg   LMP 06/03/2018 (Approximate) Comment: neg hcg 05/10/20  SpO2 96%   BMI 30.74 kg/m   Intake/Output Summary (Last 24 hours) at 06/22/2020 1331 Last data filed at 06/22/2020 0557 Gross per 24 hour  Intake 1304 ml  Output -  Net 1304 ml   Intake/Output: I/O last 3 completed shifts: In: 3846 [P.O.:120; I.V.:3; NG/GT:1181] Out: 250 [Urine:250]  Intake/Output this shift:  No intake/output data recorded. Weight change: -0.3 kg Gen: NAD CVS: tachy at 107 Resp:cta Abd: +BS, soft, NT/ND Ext: no edema  Recent Labs  Lab 06/16/20 0423 06/17/20 1015 06/18/20 0152 06/19/20 0303 06/20/20 0147 06/21/20 0226 06/22/20 0248  NA 133* 130* 132* 134* 136 138 139  K 4.0 4.3 3.6 3.7 3.6 3.8 3.4*  CL 95* 93* 96* 97* 99 99 102  CO2 25 22 25 24 23 25 24   GLUCOSE 194* 141* 196* 183* 194* 175* 154*  BUN 38* 64* 30* 49* 57* 23* 24*  CREATININE 2.76* 4.34* 2.55* 3.60* 3.80* 2.20* 2.24*  ALBUMIN 2.3* 2.3* 2.2* 2.3* 2.3* 2.3* 2.5*  CALCIUM 8.3* 8.9 8.4* 8.7* 8.8* 8.8* 9.0  PHOS 3.1 4.1 3.5 5.0* 5.7* 4.0 4.6  AST 40  --   --   --   --   --   --   ALT 42  --   --   --   --   --   --    Liver Function Tests: Recent Labs  Lab 06/16/20 0423 06/17/20 1015 06/20/20 0147 06/21/20 0226 06/22/20 0248  AST 40  --   --   --   --   ALT 42  --   --   --   --   ALKPHOS 96  --   --   --   --   BILITOT 0.7  --   --   --   --   PROT 5.7*  --   --   --   --   ALBUMIN 2.3*   < > 2.3* 2.3* 2.5*   < > = values in this interval not displayed.   No results for input(s): LIPASE, AMYLASE in the last 168 hours. No results for input(s): AMMONIA in the last 168 hours. CBC: Recent Labs  Lab 06/16/20 0423 06/16/20 1401 06/17/20 1015 06/18/20 0201  06/19/20 0303 06/20/20 0147  WBC 7.7  --  10.3  --   --  7.3  HGB 7.1*   < > 9.1* 8.4* 8.7* 8.3*  HCT 22.5*   < > 27.7* 25.8* 26.9* 24.3*  MCV 97.8  --  92.0  --   --  93.5  PLT 189  --  295  --   --  369   < > = values in this interval not displayed.   Cardiac Enzymes: No results for input(s): CKTOTAL, CKMB, CKMBINDEX, TROPONINI in the last 168 hours. CBG: Recent Labs  Lab 06/21/20 2113 06/22/20 0240 06/22/20 0455 06/22/20 0748 06/22/20 1109  GLUCAP 149* 152* 144* 140* 120*    Iron Studies: No results for input(s): IRON, TIBC, TRANSFERRIN, FERRITIN in the last 72 hours. Studies/Results: No results found. . sodium chloride  Intravenous Once  . ARIPiprazole  5 mg Per Tube Daily  . chlorhexidine  15 mL Mouth Rinse BID  . Chlorhexidine Gluconate Cloth  6 each Topical Q0600  . darbepoetin (ARANESP) injection - NON-DIALYSIS  40 mcg Subcutaneous Q Thu-1800  . feeding supplement  237 mL Oral BID BM  . feeding supplement (PROSource TF)  90 mL Per Tube BID  . feeding supplement (VITAL 1.5 CAL)  720 mL Per Tube Q24H  . FLUoxetine  40 mg Per Tube Daily  . heparin injection (subcutaneous)  5,000 Units Subcutaneous Q8H  . insulin aspart  0-9 Units Subcutaneous Q4H  . mouth rinse  15 mL Mouth Rinse q12n4p  . metoCLOPramide (REGLAN) injection  5 mg Intravenous Q8H  . midodrine  10 mg Per Tube TID WC  . multivitamin  1 tablet Per Tube QHS  . pantoprazole sodium  40 mg Per Tube Q24H  . sodium chloride flush  10-40 mL Intracatheter Q12H    BMET    Component Value Date/Time   NA 139 06/22/2020 0248   K 3.4 (L) 06/22/2020 0248   CL 102 06/22/2020 0248   CO2 24 06/22/2020 0248   GLUCOSE 154 (H) 06/22/2020 0248   BUN 24 (H) 06/22/2020 0248   CREATININE 2.24 (H) 06/22/2020 0248   CALCIUM 9.0 06/22/2020 0248   GFRNONAA 26 (L) 06/22/2020 0248   GFRAA >60 05/05/2019 1207   CBC    Component Value Date/Time   WBC 7.3 06/20/2020 0147   RBC 2.60 (L) 06/20/2020 0147   HGB 8.3 (L)  06/20/2020 0147   HCT 24.3 (L) 06/20/2020 0147   PLT 369 06/20/2020 0147   MCV 93.5 06/20/2020 0147   MCH 31.9 06/20/2020 0147   MCHC 34.2 06/20/2020 0147   RDW 14.6 06/20/2020 0147   LYMPHSABS 1.1 05/12/2020 0042   MONOABS 0.6 05/12/2020 0042   EOSABS 0.3 05/12/2020 0042   BASOSABS 0.0 05/12/2020 0042      Assessment/Plan: 1. AKI, oliguric- presumably due to ischemic ATN following cardiac arrest. Started on CRRT 06/08/20 then stopped 2/21 and transitioned to IHD on 06/15/20.  1. Starting to have increased UOP and not having complete collections due to incontinence. 2. BUN/Cr stable without HD. 3. Hold off on further HD for now as it appears taht she is having renal recovery. 4. Need purewick for better I's/O's 2. Asystolic cardiac arrest s/p CPR with ROSC 3. Acute hypoxic respiratory failure- improved 4. Shock/hypotension - possible aspiration pna. Now improved 5. Vascular access- s/p RIJ TDC placed 06/14/20. 6. Hematochezia- GI following. 7. Disposition- PT/OT working with patient. No CIR beds for now.    Donetta Potts, MD [employer] [phone]

## 2020-06-23 ENCOUNTER — Inpatient Hospital Stay (HOSPITAL_COMMUNITY)
Admission: RE | Admit: 2020-06-23 | Discharge: 2020-07-02 | DRG: 070 | Disposition: A | Discharge status: Home Health | Payer: Medicare Other | Source: Intra-hospital | Attending: Physical Medicine & Rehabilitation | Admitting: Physical Medicine & Rehabilitation

## 2020-06-23 ENCOUNTER — Inpatient Hospital Stay (HOSPITAL_COMMUNITY): Payer: Medicare Other

## 2020-06-23 ENCOUNTER — Other Ambulatory Visit: Payer: Self-pay

## 2020-06-23 ENCOUNTER — Encounter (HOSPITAL_COMMUNITY): Payer: Self-pay | Admitting: Physical Medicine and Rehabilitation

## 2020-06-23 DIAGNOSIS — E876 Hypokalemia: Secondary | ICD-10-CM | POA: Diagnosis present

## 2020-06-23 DIAGNOSIS — R112 Nausea with vomiting, unspecified: Secondary | ICD-10-CM

## 2020-06-23 DIAGNOSIS — D6489 Other specified anemias: Secondary | ICD-10-CM | POA: Diagnosis present

## 2020-06-23 DIAGNOSIS — E785 Hyperlipidemia, unspecified: Secondary | ICD-10-CM | POA: Diagnosis present

## 2020-06-23 DIAGNOSIS — I469 Cardiac arrest, cause unspecified: Secondary | ICD-10-CM | POA: Diagnosis not present

## 2020-06-23 DIAGNOSIS — F1910 Other psychoactive substance abuse, uncomplicated: Secondary | ICD-10-CM | POA: Diagnosis not present

## 2020-06-23 DIAGNOSIS — R000 Tachycardia, unspecified: Secondary | ICD-10-CM | POA: Diagnosis present

## 2020-06-23 DIAGNOSIS — I119 Hypertensive heart disease without heart failure: Secondary | ICD-10-CM | POA: Diagnosis present

## 2020-06-23 DIAGNOSIS — I10 Essential (primary) hypertension: Secondary | ICD-10-CM

## 2020-06-23 DIAGNOSIS — R7303 Prediabetes: Secondary | ICD-10-CM | POA: Diagnosis present

## 2020-06-23 DIAGNOSIS — M1990 Unspecified osteoarthritis, unspecified site: Secondary | ICD-10-CM | POA: Diagnosis present

## 2020-06-23 DIAGNOSIS — N170 Acute kidney failure with tubular necrosis: Secondary | ICD-10-CM | POA: Diagnosis present

## 2020-06-23 DIAGNOSIS — R1310 Dysphagia, unspecified: Secondary | ICD-10-CM | POA: Diagnosis present

## 2020-06-23 DIAGNOSIS — D638 Anemia in other chronic diseases classified elsewhere: Secondary | ICD-10-CM | POA: Diagnosis present

## 2020-06-23 DIAGNOSIS — S82891D Other fracture of right lower leg, subsequent encounter for closed fracture with routine healing: Secondary | ICD-10-CM | POA: Diagnosis not present

## 2020-06-23 DIAGNOSIS — F411 Generalized anxiety disorder: Secondary | ICD-10-CM | POA: Diagnosis present

## 2020-06-23 DIAGNOSIS — X58XXXD Exposure to other specified factors, subsequent encounter: Secondary | ICD-10-CM | POA: Diagnosis present

## 2020-06-23 DIAGNOSIS — Z85828 Personal history of other malignant neoplasm of skin: Secondary | ICD-10-CM | POA: Diagnosis not present

## 2020-06-23 DIAGNOSIS — F4310 Post-traumatic stress disorder, unspecified: Secondary | ICD-10-CM | POA: Diagnosis present

## 2020-06-23 DIAGNOSIS — E669 Obesity, unspecified: Secondary | ICD-10-CM | POA: Diagnosis present

## 2020-06-23 DIAGNOSIS — N809 Endometriosis, unspecified: Secondary | ICD-10-CM | POA: Diagnosis present

## 2020-06-23 DIAGNOSIS — Z8616 Personal history of COVID-19: Secondary | ICD-10-CM

## 2020-06-23 DIAGNOSIS — G479 Sleep disorder, unspecified: Secondary | ICD-10-CM | POA: Diagnosis present

## 2020-06-23 DIAGNOSIS — G8929 Other chronic pain: Secondary | ICD-10-CM | POA: Diagnosis present

## 2020-06-23 DIAGNOSIS — Z79891 Long term (current) use of opiate analgesic: Secondary | ICD-10-CM

## 2020-06-23 DIAGNOSIS — F5089 Other specified eating disorder: Secondary | ICD-10-CM | POA: Diagnosis not present

## 2020-06-23 DIAGNOSIS — G9341 Metabolic encephalopathy: Principal | ICD-10-CM | POA: Diagnosis present

## 2020-06-23 DIAGNOSIS — Z9151 Personal history of suicidal behavior: Secondary | ICD-10-CM

## 2020-06-23 DIAGNOSIS — Z8659 Personal history of other mental and behavioral disorders: Secondary | ICD-10-CM

## 2020-06-23 DIAGNOSIS — Z79899 Other long term (current) drug therapy: Secondary | ICD-10-CM

## 2020-06-23 DIAGNOSIS — G931 Anoxic brain damage, not elsewhere classified: Secondary | ICD-10-CM | POA: Diagnosis not present

## 2020-06-23 DIAGNOSIS — R110 Nausea: Secondary | ICD-10-CM | POA: Diagnosis present

## 2020-06-23 DIAGNOSIS — F319 Bipolar disorder, unspecified: Secondary | ICD-10-CM | POA: Diagnosis present

## 2020-06-23 DIAGNOSIS — R32 Unspecified urinary incontinence: Secondary | ICD-10-CM | POA: Diagnosis present

## 2020-06-23 DIAGNOSIS — R159 Full incontinence of feces: Secondary | ICD-10-CM | POA: Diagnosis present

## 2020-06-23 DIAGNOSIS — G4700 Insomnia, unspecified: Secondary | ICD-10-CM | POA: Diagnosis present

## 2020-06-23 DIAGNOSIS — Z6830 Body mass index (BMI) 30.0-30.9, adult: Secondary | ICD-10-CM

## 2020-06-23 DIAGNOSIS — D62 Acute posthemorrhagic anemia: Secondary | ICD-10-CM | POA: Diagnosis not present

## 2020-06-23 DIAGNOSIS — Z91011 Allergy to milk products: Secondary | ICD-10-CM

## 2020-06-23 DIAGNOSIS — Z8674 Personal history of sudden cardiac arrest: Secondary | ICD-10-CM

## 2020-06-23 HISTORY — PX: IR REMOVAL TUN CV CATH W/O FL: IMG2289

## 2020-06-23 LAB — CBC
HCT: 28.9 % — ABNORMAL LOW (ref 36.0–46.0)
Hemoglobin: 9.1 g/dL — ABNORMAL LOW (ref 12.0–15.0)
MCH: 30.2 pg (ref 26.0–34.0)
MCHC: 31.5 g/dL (ref 30.0–36.0)
MCV: 96 fL (ref 80.0–100.0)
Platelets: 302 10*3/uL (ref 150–400)
RBC: 3.01 MIL/uL — ABNORMAL LOW (ref 3.87–5.11)
RDW: 14.2 % (ref 11.5–15.5)
WBC: 6.5 10*3/uL (ref 4.0–10.5)
nRBC: 0 % (ref 0.0–0.2)

## 2020-06-23 LAB — RENAL FUNCTION PANEL
Albumin: 2.4 g/dL — ABNORMAL LOW (ref 3.5–5.0)
Anion gap: 13 (ref 5–15)
BUN: 20 mg/dL (ref 6–20)
CO2: 24 mmol/L (ref 22–32)
Calcium: 9.1 mg/dL (ref 8.9–10.3)
Chloride: 103 mmol/L (ref 98–111)
Creatinine, Ser: 1.81 mg/dL — ABNORMAL HIGH (ref 0.44–1.00)
GFR, Estimated: 34 mL/min — ABNORMAL LOW (ref >60)
Glucose, Bld: 139 mg/dL — ABNORMAL HIGH (ref 70–99)
Phosphorus: 4.7 mg/dL — ABNORMAL HIGH (ref 2.5–4.6)
Potassium: 3.4 mmol/L — ABNORMAL LOW (ref 3.5–5.1)
Sodium: 140 mmol/L (ref 135–145)

## 2020-06-23 LAB — GLUCOSE, CAPILLARY
Glucose-Capillary: 129 mg/dL — ABNORMAL HIGH (ref 70–99)
Glucose-Capillary: 132 mg/dL — ABNORMAL HIGH (ref 70–99)
Glucose-Capillary: 134 mg/dL — ABNORMAL HIGH (ref 70–99)
Glucose-Capillary: 146 mg/dL — ABNORMAL HIGH (ref 70–99)
Glucose-Capillary: 150 mg/dL — ABNORMAL HIGH (ref 70–99)

## 2020-06-23 LAB — CREATININE, SERUM
Creatinine, Ser: 1.72 mg/dL — ABNORMAL HIGH (ref 0.44–1.00)
GFR, Estimated: 36 mL/min — ABNORMAL LOW (ref >60)

## 2020-06-23 MED ORDER — PROSOURCE TF PO LIQD
45.0000 mL | Freq: Four times a day (QID) | ORAL | Status: DC
  Filled 2020-06-23 (×2): qty 45

## 2020-06-23 MED ORDER — CHLORHEXIDINE GLUCONATE 4 % EX LIQD
CUTANEOUS | Status: AC
  Filled 2020-06-23: qty 15

## 2020-06-23 MED ORDER — INSULIN ASPART 100 UNIT/ML ~~LOC~~ SOLN
0.0000 [IU] | SUBCUTANEOUS | Status: DC
  Administered 2020-06-23 – 2020-06-24 (×5): 1 [IU] via SUBCUTANEOUS
  Administered 2020-06-25 (×2): 2 [IU] via SUBCUTANEOUS
  Administered 2020-06-26: 1 [IU] via SUBCUTANEOUS
  Administered 2020-06-27: 2 [IU] via SUBCUTANEOUS
  Administered 2020-06-27 – 2020-06-28 (×5): 1 [IU] via SUBCUTANEOUS
  Administered 2020-06-28 – 2020-06-30 (×3): 2 [IU] via SUBCUTANEOUS

## 2020-06-23 MED ORDER — METOCLOPRAMIDE HCL 5 MG/ML IJ SOLN
10.0000 mg | Freq: Three times a day (TID) | INTRAMUSCULAR | Status: DC
  Administered 2020-06-23: 10 mg via INTRAVENOUS
  Filled 2020-06-23: qty 2

## 2020-06-23 MED ORDER — HEPARIN SODIUM (PORCINE) 5000 UNIT/ML IJ SOLN
5000.0000 [IU] | Freq: Three times a day (TID) | INTRAMUSCULAR | Status: DC
  Administered 2020-06-23 – 2020-07-02 (×26): 5000 [IU] via SUBCUTANEOUS
  Filled 2020-06-23 (×26): qty 1

## 2020-06-23 MED ORDER — PANTOPRAZOLE SODIUM 40 MG PO PACK
40.0000 mg | PACK | ORAL | Status: DC
  Administered 2020-06-24: 40 mg
  Filled 2020-06-23: qty 20

## 2020-06-23 MED ORDER — METOCLOPRAMIDE HCL 10 MG PO TABS: 10.0000 mg | ORAL_TABLET | Freq: Three times a day (TID) | ORAL | 1 refills | Status: DC

## 2020-06-23 MED ORDER — VITAL 1.5 CAL PO LIQD
1000.0000 mL | ORAL | Status: DC
  Administered 2020-06-23: 1000 mL
  Filled 2020-06-23 (×2): qty 1000

## 2020-06-23 MED ORDER — ACETAMINOPHEN 325 MG PO TABS
650.0000 mg | ORAL_TABLET | Freq: Four times a day (QID) | ORAL | Status: DC | PRN
  Administered 2020-06-23 – 2020-07-02 (×15): 650 mg via ORAL
  Filled 2020-06-23 (×16): qty 2

## 2020-06-23 MED ORDER — FLUOXETINE HCL 20 MG PO CAPS
40.0000 mg | ORAL_CAPSULE | Freq: Every day | ORAL | Status: DC
  Administered 2020-06-24 – 2020-06-25 (×2): 40 mg
  Filled 2020-06-23 (×2): qty 2

## 2020-06-23 MED ORDER — MIDODRINE HCL 5 MG PO TABS
10.0000 mg | ORAL_TABLET | Freq: Three times a day (TID) | ORAL | Status: DC
  Administered 2020-06-23 – 2020-06-24 (×4): 10 mg
  Filled 2020-06-23 (×4): qty 2

## 2020-06-23 MED ORDER — ARIPIPRAZOLE 5 MG PO TABS: 5.0000 mg | ORAL_TABLET | Freq: Every day | ORAL | 0 refills | Status: DC

## 2020-06-23 MED ORDER — DARBEPOETIN ALFA 40 MCG/0.4ML IJ SOSY
40.0000 ug | PREFILLED_SYRINGE | INTRAMUSCULAR | Status: DC
  Administered 2020-06-23: 40 ug via SUBCUTANEOUS
  Filled 2020-06-23: qty 0.4

## 2020-06-23 MED ORDER — METOCLOPRAMIDE HCL 5 MG PO TABS
10.0000 mg | ORAL_TABLET | Freq: Three times a day (TID) | ORAL | Status: DC
  Administered 2020-06-23 – 2020-07-02 (×26): 10 mg via ORAL
  Filled 2020-06-23 (×28): qty 2

## 2020-06-23 MED ORDER — MIDODRINE HCL 10 MG PO TABS: 10.0000 mg | ORAL_TABLET | Freq: Three times a day (TID) | ORAL | 0 refills | Status: DC

## 2020-06-23 MED ORDER — ARIPIPRAZOLE 10 MG PO TABS
5.0000 mg | ORAL_TABLET | Freq: Every day | ORAL | Status: DC
  Administered 2020-06-24 – 2020-06-25 (×2): 5 mg
  Filled 2020-06-23 (×2): qty 1

## 2020-06-23 MED ORDER — DOCUSATE SODIUM 50 MG/5ML PO LIQD: 100.0000 mg | Freq: Every day | ORAL | Status: DC | PRN

## 2020-06-23 MED ORDER — RENA-VITE PO TABS
1.0000 | ORAL_TABLET | Freq: Every day | ORAL | Status: DC
  Administered 2020-06-24: 1
  Filled 2020-06-23 (×2): qty 1

## 2020-06-23 MED ORDER — LIDOCAINE HCL 1 % IJ SOLN
INTRAMUSCULAR | Status: AC
  Filled 2020-06-23: qty 20

## 2020-06-23 MED ORDER — HEPARIN SODIUM (PORCINE) 5000 UNIT/ML IJ SOLN: 5000.0000 [IU] | Freq: Three times a day (TID) | INTRAMUSCULAR | Status: DC

## 2020-06-23 MED ORDER — POLYETHYLENE GLYCOL 3350 17 G PO PACK: 17.0000 g | PACK | Freq: Every day | ORAL | Status: DC | PRN

## 2020-06-23 NOTE — H&P (Signed)
Physical Medicine and Rehabilitation Admission H&P        Chief Complaint  Patient presents with  . Post CPR  : HPI: [name]. Christner is a 50 year old right-handed female with history of depression, opiate use, PTSD with reported prior suicide attempts maintained on Prozac, Lamictal as well as Risperdal,, hypertension, Covid +1/18, endometriosis, ORIF right ankle 03/28/2020 and is weightbearing as tolerated and has been rather sedentary since her fracture.  Per chart review patient lives with spouse and 2 adult aged children.  1 level home 2 steps to entry.  Presented 06/08/2020 after reportedly falling out of the bed at some point during the night and family let her sleep on the floor when they later checked on her she was unresponsive.  First responders found her to be pulseless and asystolic.  She was intubated with a King airway and received CPR x3 minutes and epi x1 with ROSC.  Immediately post ROSC her EKG was concerning for STEMI but subsequent EKG were improved.  Cranial CT scan negative.  Chemistries revealed potassium 6.5 chloride 89 glucose 180 BUN 214 creatinine 24.91 from baseline 0.83, WBC 16,200 hemoglobin 10.6, troponin 70, urine drug screen positive amphetamines, alcohol negative, lactic acid 4.4, CK 5564, magnesium 2.7 hemoglobin A1c 6.1.  Renal ultrasound unremarkable.  Echocardiogram with ejection fraction of 55 to 60% no wall motion abnormalities.  She remained intubated 2/16-2/20.  Started on CRRT and transition to IHD on 06/15/2020 followed by renal services with latest renal function much improved creatinine 2.24-1.81 and her hemodialysis is currently on hold as she is showing renal recovery.  Chest x-ray suspicious for aspiration pneumonia completing course of Rocephin. Gastroenterology consulted 06/16/2020 for nonspecific lower abdominal pain and some bright red blood per rectum and hemoglobin dipping to 7.1 overnight day.. It was noted patient with extensive GI work-up in 2020  for IDA and cyclic vomiting to include EGD showing non-H Pyloric gastritis, colonoscopy benign rectal erythema and VCE 1 small nonbleeding distal AVM. She was transfused 1 unit packed red blood cells gastroenterology services felt no further work-up was needed with close monitoring of hemoglobin and hematocrit. Her hemoglobin has stabilized 8.3 and she was cleared to begin subcutaneous heparin for DVT prophylaxis. Her cardiac status remained stable followed by Dr. Gwenlyn Found of cardiology services. She was maintained on midodrine for blood pressure. Psychiatry consulted for history of depression PTSD with prior suicide attempts and currently remains on Prozac as prior to admission with the addition of Abilify. She is tolerating a dysphagia #2 thin liquid diet and currently receiving nasogastric tube feeds for nutritional support. Therapy evaluations completed due to patient decreased functional ability was admitted for a comprehensive rehab program.   Pt reports with husband assistance- doesn't like hospital food- is picky- so getting fed via Cortrak for much of diet.  Also per husband, has some confusion and memory issues.  Has a lot of nausea which is chronic- has a high gag reflex.  LBM overnight- but usually accidents since in hospital per husband- pt doesn't remember.  Using Purewick for urination, however appears to be dark amber and small amount in container.    Per husband had been taking Zanaflex 64 MG at home daily- from doctor that insurance didn't cover- he said she was abusing it to knock herself out, etc- and doesn't want her on ANY muscle relaxants anymore.      Review of Systems  Constitutional: Negative for chills and fever.  HENT: Negative for hearing loss.  Eyes: Negative for blurred vision and double vision.  Respiratory: Negative for cough and shortness of breath.   Cardiovascular: Positive for leg swelling. Negative for chest pain and palpitations.  Gastrointestinal: Positive for  constipation. Negative for heartburn, nausea and vomiting.  Genitourinary: Negative for dysuria, flank pain and hematuria.  Musculoskeletal: Positive for back pain, joint pain and myalgias.  Skin: Negative for rash.  Neurological: Positive for headaches. Negative for seizures.  Psychiatric/Behavioral: Positive for depression. The patient has insomnia.        Anxiety  All other systems reviewed and are negative.       Past Medical History:  Diagnosis Date  . Anxiety    . Arthritis      "joints ache all over" (10/15/2014)  . Barrett's esophagus    . Bulging lumbar disc    . Chronic lower back pain    . DDD (degenerative disc disease), cervical    . Depression    . Drug-seeking behavior    . Headache      "weekly" (10/15/2014)  . Hyperlipemia    . Hypertension    . PTSD (post-traumatic stress disorder)    . Skin cancer      "had them cut off my arms; don't know what kind"         Past Surgical History:  Procedure Laterality Date  . ABLATION ON ENDOMETRIOSIS   2008  . BIOPSY   12/27/2018    Procedure: BIOPSY;  Surgeon: Thornton Park, MD;  Location: [facility];  Service: Gastroenterology;;  . ESOPHAGOGASTRODUODENOSCOPY (EGD) WITH PROPOFOL N/A 12/27/2018    Procedure: ESOPHAGOGASTRODUODENOSCOPY (EGD) WITH PROPOFOL;  Surgeon: Thornton Park, MD;  Location: [facility];  Service: Gastroenterology;  Laterality: N/A;  . HEMORRHOID SURGERY   ~ 2002  . IR FLUORO GUIDE CV LINE RIGHT   06/14/2020  . IR US GUIDE VASC ACCESS RIGHT   06/14/2020  . ORIF ANKLE FRACTURE Right 03/28/2020    Procedure: OPEN REDUCTION INTERNAL FIXATION (ORIF) RIGHT BIMALLEOLAR ANKLE FRACTURE;  Surgeon: Marchia Bond, MD;  Location: [place];  Service: Orthopedics;  Laterality: Right;         Family History  Problem Relation Age of Onset  . Breast cancer Mother    . Diabetes Mother    . Breast cancer Maternal Grandmother    . Breast cancer Paternal Grandmother    . Colon polyps Paternal  Grandmother    . Colon cancer Neg Hx    . Esophageal cancer Neg Hx    . Rectal cancer Neg Hx    . Stomach cancer Neg Hx      Social History:  reports that she has never smoked. She has never used smokeless tobacco. She reports previous alcohol use. She reports that she does not use drugs. Allergies:  Allergies  Allergen Reactions  . Lactose Intolerance (Gi) Nausea Only          Medications Prior to Admission  Medication Sig Dispense Refill  . ALPRAZOLAM PO Take 1 tablet by mouth daily.      . ferrous sulfate 325 (65 FE) MG tablet Take 325 mg by mouth daily with breakfast.      . FLUoxetine (PROZAC) 40 MG capsule Take 40 mg by mouth daily.       . hydrochlorothiazide (HYDRODIURIL) 25 MG tablet Take 1 tablet (25 mg total) by mouth at bedtime.      . lamoTRIgine (LAMICTAL) 200 MG tablet Take 100-200 mg by mouth See admin instructions. 200mg  in  the morning and 100mg  in the evening      . lisinopril (ZESTRIL) 10 MG tablet Take 1 tablet (10 mg total) by mouth daily.      Marland Kitchen omeprazole (PRILOSEC) 20 MG capsule Take 1 capsule (20 mg total) by mouth daily. 30 capsule 0  . risperiDONE (RISPERDAL) 0.5 MG tablet Take 0.5 mg by mouth 2 (two) times daily.   2  . tiZANidine (ZANAFLEX) 4 MG tablet Take 12 mg by mouth 3 (three) times daily.      . traZODone (DESYREL) 150 MG tablet Take 150 mg by mouth at bedtime.           Drug Regimen Review Drug regimen was reviewed and remains appropriate with no significant issues identified   Home: Home Living Family/patient expects to be discharged to:: Private residence Living Arrangements: Spouse/significant other,Children Available Help at Discharge: Family,Available 24 hours/day Type of Home: House Home Access: Stairs to enter,Other (comment) Entrance Stairs-Number of Steps: 2 Entrance Stairs-Rails: None Home Layout: One level Bathroom Shower/Tub: Chiropodist: Standard Bathroom Accessibility: Yes Home Equipment: Environmental consultant - 2  wheels,Wheelchair - manual (reported on 04/2020 thay pt had tub bench and BSC) Additional Comments: Information different from previous stay in Jan 2022  Lives With: Spouse   Functional History: Prior Function Level of Independence: Needs assistance ADL's / Homemaking Assistance Needed: needed assistance Comments: Pt possibly with unreliable info as pt with differing info from last admission in Jan 2022   Functional Status:  Mobility: Bed Mobility Overal bed mobility: Needs Assistance Bed Mobility: Supine to Sit Rolling: Supervision Supine to sit: Min guard,HOB elevated Sit to supine: Min guard General bed mobility comments: Assist for safety and lines. Incr time to perform Transfers Overall transfer level: Needs assistance Equipment used: Rolling walker (2 wheeled) Transfers: Sit to/from Stand Sit to Stand: Min assist Stand pivot transfers: Min guard General transfer comment: Assist for balance. Verbal cues for hand placement. Ambulation/Gait Ambulation/Gait assistance: Min assist Gait Distance (Feet): 60 Feet Assistive device: Rolling walker (2 wheeled) Gait Pattern/deviations: Step-through pattern,Decreased stride length,Trunk flexed,Wide base of support General Gait Details: Assist for balance and support. Verbal cues for posture. Gait velocity: decr Gait velocity interpretation: 1.31 - 2.62 ft/sec, indicative of limited community ambulator   ADL: ADL Overall ADL's : Needs assistance/impaired Eating/Feeding: Sitting Eating/Feeding Details (indicate cue type and reason): modified; thickened liquids; pt asking for water Grooming: Set up,Supervision/safety,Sitting Upper Body Bathing: Set up,Supervision/ safety,Sitting Lower Body Bathing: Moderate assistance,Sit to/from stand Upper Body Dressing : Minimal assistance,Sitting Lower Body Dressing: Moderate assistance,Sit to/from stand Toilet Transfer: Minimal Office manager Details (indicate cue type and  reason): +2 simulated to recliner Toileting- Clothing Manipulation and Hygiene: Minimal assistance,Sit to/from stand,Sitting/lateral lean Functional mobility during ADLs: Minimal assistance,Rolling walker,Cueing for safety General ADL Comments: Pt limited by varying levels of arousal and decreased cognition; decreased strength and ability to care for self.   Cognition: Cognition Overall Cognitive Status: Impaired/Different from baseline Orientation Level: Oriented to person,Oriented to place,Disoriented to time Cognition Arousal/Alertness: Awake/alert Behavior During Therapy: Flat affect Overall Cognitive Status: Impaired/Different from baseline Area of Impairment: Attention,Memory,Following commands,Safety/judgement,Awareness,Problem solving Orientation Level: Disoriented to,Time Current Attention Level: Selective Memory: Decreased short-term memory Following Commands: Follows one step commands consistently Safety/Judgement: Decreased awareness of safety,Decreased awareness of deficits Awareness: Emergent Problem Solving: Slow processing,Decreased initiation,Difficulty sequencing,Requires verbal cues General Comments: Aware of month but does not know if it is the beginning or end of Febuary; very slow processing   Physical Exam: Blood pressure Marland Kitchen)  141/86, pulse (!) 102, temperature 99.4 F (37.4 C), temperature source Oral, resp. rate 18, height 5\' 6"  (1.676 m), weight 81.1 kg, last menstrual period 06/03/2018, SpO2 94 %. Physical Exam Vitals and nursing note reviewed. Exam conducted with a chaperone present.  Constitutional:      Comments: Pt sitting up in bedside chair- anoxic/VERY flat appearing, monotone voice, husband at bedside throughout, NAD  HENT:     Head: Normocephalic and atraumatic.     Comments: Nasogastric tube in place. Cortrak in place Cannot/willnot smile- tongue midline; doesn't appear to have any asymmetry to face    Right Ear: External ear normal.     Left  Ear: External ear normal.     Nose: Nose normal. No congestion.     Mouth/Throat:     Mouth: Mucous membranes are dry.     Pharynx: Oropharynx is clear. No oropharyngeal exudate.  Eyes:     General:        Right eye: No discharge.        Left eye: No discharge.     Extraocular Movements: Extraocular movements intact.  Cardiovascular:     Comments: Tachycardic- regular rhythm- rate low 100s;  Of note, BP 601U-932T systolic Pulmonary:     Comments: CTA B/L- no W/R/R- good air movement Abdominal:     Comments: Soft, NT, ND, (+)BS -hypoactive- c/o nausea "all the time"  Genitourinary:    Comments: purewick in place Musculoskeletal:     Cervical back: Normal range of motion. No rigidity.     Comments: UEs- poor compliance to coomands and  poor effort MS: at least 4/5 in UEs- finger abduction slightly less, but would not try during exam LEs- RLE- HF 3+/5, KE 3+/5, at least 4/5 distally LLE- HF 3-/5, KE 3-/5, DF and PF 4/5  stronger on R side than L side in LEs   Skin:    General: Skin is warm and dry.     Comments: Splotchy red splotches on 1st to 4th toes on L foot- new Dry elbows IV L hand- looks OK.  Big bruise on R lower abdomen with scattered bruising on abdomen from SQ Heparin as well as on UEs B/L  Neurological:     Comments: Patient is alert.  Mood is flat but appropriate.  Makes eye contact with examiner.  Provides her name and age.  Follows simple commands.  She cannot recall full events of her hospital stay. Appears anoxic- extremely flat affect; monotone Poor memory Ox1-2 Can't remember when had BMs last or when vomited last.  Needed constant cues to participate at all in exam  Psychiatric:     Comments: Flat, anoxic?        Lab Results Last 48 Hours        Results for orders placed or performed during the hospital encounter of 06/08/20 (from the past 48 hour(s))  Glucose, capillary     Status: Abnormal    Collection Time: 06/21/20  7:37 AM  Result Value Ref  Range    Glucose-Capillary 113 (H) 70 - 99 mg/dL      Comment: Glucose reference range applies only to samples taken after fasting for at least 8 hours.  Glucose, capillary     Status: Abnormal    Collection Time: 06/21/20 11:40 AM  Result Value Ref Range    Glucose-Capillary 130 (H) 70 - 99 mg/dL      Comment: Glucose reference range applies only to samples taken after fasting for at least 8  hours.  Glucose, capillary     Status: Abnormal    Collection Time: 06/21/20  4:44 PM  Result Value Ref Range    Glucose-Capillary 103 (H) 70 - 99 mg/dL      Comment: Glucose reference range applies only to samples taken after fasting for at least 8 hours.  Glucose, capillary     Status: Abnormal    Collection Time: 06/21/20  9:13 PM  Result Value Ref Range    Glucose-Capillary 149 (H) 70 - 99 mg/dL      Comment: Glucose reference range applies only to samples taken after fasting for at least 8 hours.  Glucose, capillary     Status: Abnormal    Collection Time: 06/22/20  2:40 AM  Result Value Ref Range    Glucose-Capillary 152 (H) 70 - 99 mg/dL      Comment: Glucose reference range applies only to samples taken after fasting for at least 8 hours.  Renal function panel     Status: Abnormal    Collection Time: 06/22/20  2:48 AM  Result Value Ref Range    Sodium 139 135 - 145 mmol/L    Potassium 3.4 (L) 3.5 - 5.1 mmol/L    Chloride 102 98 - 111 mmol/L    CO2 24 22 - 32 mmol/L    Glucose, Bld 154 (H) 70 - 99 mg/dL      Comment: Glucose reference range applies only to samples taken after fasting for at least 8 hours.    BUN 24 (H) 6 - 20 mg/dL    Creatinine, Ser 2.24 (H) 0.44 - 1.00 mg/dL    Calcium 9.0 8.9 - 10.3 mg/dL    Phosphorus 4.6 2.5 - 4.6 mg/dL    Albumin 2.5 (L) 3.5 - 5.0 g/dL    GFR, Estimated 26 (L) >60 mL/min      Comment: (NOTE) Calculated using the CKD-EPI Creatinine Equation (2021)      Anion gap 13 5 - 15      Comment: Performed at [facility] [address]  [place], [place] [address]  Glucose, capillary     Status: Abnormal    Collection Time: 06/22/20  4:55 AM  Result Value Ref Range    Glucose-Capillary 144 (H) 70 - 99 mg/dL      Comment: Glucose reference range applies only to samples taken after fasting for at least 8 hours.  Glucose, capillary     Status: Abnormal    Collection Time: 06/22/20  7:48 AM  Result Value Ref Range    Glucose-Capillary 140 (H) 70 - 99 mg/dL      Comment: Glucose reference range applies only to samples taken after fasting for at least 8 hours.  Glucose, capillary     Status: Abnormal    Collection Time: 06/22/20 11:09 AM  Result Value Ref Range    Glucose-Capillary 120 (H) 70 - 99 mg/dL      Comment: Glucose reference range applies only to samples taken after fasting for at least 8 hours.  Glucose, capillary     Status: Abnormal    Collection Time: 06/22/20  4:09 PM  Result Value Ref Range    Glucose-Capillary 105 (H) 70 - 99 mg/dL      Comment: Glucose reference range applies only to samples taken after fasting for at least 8 hours.  Glucose, capillary     Status: Abnormal    Collection Time: 06/22/20  8:17 PM  Result Value Ref Range  Glucose-Capillary 137 (H) 70 - 99 mg/dL      Comment: Glucose reference range applies only to samples taken after fasting for at least 8 hours.  Glucose, capillary     Status: Abnormal    Collection Time: 06/22/20 11:57 PM  Result Value Ref Range    Glucose-Capillary 133 (H) 70 - 99 mg/dL      Comment: Glucose reference range applies only to samples taken after fasting for at least 8 hours.  Glucose, capillary     Status: Abnormal    Collection Time: 06/23/20  4:03 AM  Result Value Ref Range    Glucose-Capillary 150 (H) 70 - 99 mg/dL      Comment: Glucose reference range applies only to samples taken after fasting for at least 8 hours.      Imaging Results (Last 48 hours)  No results found.           Medical Problem List and Plan: 1. Decreased functional mobility  secondary to acute hypoxic respiratory failure with compromised airway in the setting of cardiac arrest/CPR and aspiration pneumonitis. Extubated 2/20- with likely anoxic brain injury due to  3 minutes minimum  of asystole      -patient may  shower             -ELOS/Goals: goals Supervision to min A- 10-12 days? 2.  Antithrombotics: -DVT/anticoagulation: Subcutaneous heparin             -antiplatelet therapy: N/A 3. Pain Management: Tylenol as needed- NO ZANAFLEX or other muscle relaxants- pt has specific recent hx of addiction to Zanaflex.  4. Mood/PTSD: Prozac 40 mg daily. Follow-up psychiatry services as needed             -antipsychotic agents: Abilify 5 mg daily 5. Neuropsych: This patient is? capable of making decisions on her own behalf. 6. Skin/Wound Care: Routine skin checks 7. Fluids/Electrolytes/Nutrition: Routine in and outs with follow-up chemistries 8. AKI secondary to ischemic ATN following cardiac arrest. CRRT 06/08/2020 transition to hemodialysis 06/15/2020 currently on hold latest creatinine 1.81. Follow-up hemodialysis as needed- when possible, suggest switching to Lovenox? 9. Anemia of critical illness. Small hematochezia and stool morning of 06/16/2020. Follow-up gastroenterology services as needed. Continue Protonix 10. Hypertension with new Hypotension. Continue ProAmatine 10 mg 3 times daily- I suggest since BP running [id number] systolic, that it be decreased to 7.5 mg TID.  11. Prediabetes. Hemoglobin A1c 6.1. SSI 12. Dysphagia. Dysphagia #2 thin liquid diet. Follow-up speech therapy- pt has Cortrak- is "picky' per husband- and doesn't eat much- explained to pt will need to get out before rehab d/c.  13. Right ankle fracture status post ORIF 03/28/2020. Weightbearing as tolerated now. Follow-up per Dr. Mardelle Matte 14. Substance abuse. UDS positive for amphetamines. Not prescribed. Outpatient follow-up- also per husband abusing 64 mg Zanaflex daily prior to admission- doesn't want her  to have pain meds or muscle relaxants unless absolutely needed.  15. Recent COVID-19 infection. Tested +1/18. Patient did not require treatment or hospitalization. Outside the window of isolation. 16. Class I obesity. BMI 30.74. Dietary follow-up 17. Bowel and bladder incontinence- new since hospitalization-  18. Chronic nausea- on IV Reglan- will change to PO- is chronic- Reglan is new- might do best to wean as tolerated.    Lavon Paganini Angiulli, PA-C 06/23/2020      I have personally performed a face to face diagnostic evaluation of this patient and formulated the key components of the plan.  Additionally, I have personally reviewed laboratory  data, imaging studies, as well as relevant notes and concur with the physician assistant's documentation above.   The patient's status has not changed from the original H&P.  Any changes in documentation from the acute care chart have been noted above.

## 2020-06-23 NOTE — H&P (Signed)
Physical Medicine and Rehabilitation Admission H&P    Chief Complaint  Patient presents with  . Post CPR  : HPI: [name] is a 50 year old right-handed female with history of depression, opiate use, PTSD with reported prior suicide attempts maintained on Prozac, Lamictal as well as Risperdal,, hypertension, Covid +1/18, endometriosis, ORIF right ankle 03/28/2020 and is weightbearing as tolerated and has been rather sedentary since her fracture.  Per chart review patient lives with spouse and 2 adult aged children.  1 level home 2 steps to entry.  Presented 06/08/2020 after reportedly falling out of the bed at some point during the night and family let her sleep on the floor when they later checked on her she was unresponsive.  First responders found her to be pulseless and asystolic.  She was intubated with a King airway and received CPR x3 minutes and epi x1 with ROSC.  Immediately post ROSC her EKG was concerning for STEMI but subsequent EKG were improved.  Cranial CT scan negative.  Chemistries revealed potassium 6.5 chloride 89 glucose 180 BUN 214 creatinine 24.91 from baseline 0.83, WBC 16,200 hemoglobin 10.6, troponin 70, urine drug screen positive amphetamines, alcohol negative, lactic acid 4.4, CK 5564, magnesium 2.7 hemoglobin A1c 6.1.  Renal ultrasound unremarkable.  Echocardiogram with ejection fraction of 55 to 60% no wall motion abnormalities.  She remained intubated 2/16-2/20.  Started on CRRT and transition to IHD on 06/15/2020 followed by renal services with latest renal function much improved creatinine 2.24-1.81 and her hemodialysis is currently on hold as she is showing renal recovery.  Chest x-ray suspicious for aspiration pneumonia completing course of Rocephin. Gastroenterology consulted 06/16/2020 for nonspecific lower abdominal pain and some bright red blood per rectum and hemoglobin dipping to 7.1 overnight day.. It was noted patient with extensive GI work-up in 2020 for IDA  and cyclic vomiting to include EGD showing non-H Pyloric gastritis, colonoscopy benign rectal erythema and VCE 1 small nonbleeding distal AVM. She was transfused 1 unit packed red blood cells gastroenterology services felt no further work-up was needed with close monitoring of hemoglobin and hematocrit. Her hemoglobin has stabilized 8.3 and she was cleared to begin subcutaneous heparin for DVT prophylaxis. Her cardiac status remained stable followed by Dr. Gwenlyn Found of cardiology services. She was maintained on midodrine for blood pressure. Psychiatry consulted for history of depression PTSD with prior suicide attempts and currently remains on Prozac as prior to admission with the addition of Abilify. She is tolerating a dysphagia #2 thin liquid diet and currently receiving nasogastric tube feeds for nutritional support. Therapy evaluations completed due to patient decreased functional ability was admitted for a comprehensive rehab program.  Pt reports with husband assistance- doesn't like hospital food- is picky- so getting fed via Cortrak for much of diet.  Also per husband, has some confusion and memory issues.  Has a lot of nausea which is chronic- has a high gag reflex.  LBM overnight- but usually accidents since in hospital per husband- pt doesn't remember.  Using Purewick for urination, however appears to be dark amber and small amount in container.   Per husband had been taking Zanaflex 64 MG at home daily- from doctor that insurance didn't cover- he said she was abusing it to knock herself out, etc- and doesn't want her on ANY muscle relaxants anymore.    Review of Systems  Constitutional: Negative for chills and fever.  HENT: Negative for hearing loss.   Eyes: Negative for blurred vision and double vision.  Respiratory: Negative for cough and shortness of breath.   Cardiovascular: Positive for leg swelling. Negative for chest pain and palpitations.  Gastrointestinal: Positive for constipation.  Negative for heartburn, nausea and vomiting.  Genitourinary: Negative for dysuria, flank pain and hematuria.  Musculoskeletal: Positive for back pain, joint pain and myalgias.  Skin: Negative for rash.  Neurological: Positive for headaches. Negative for seizures.  Psychiatric/Behavioral: Positive for depression. The patient has insomnia.        Anxiety  All other systems reviewed and are negative.  Past Medical History:  Diagnosis Date  . Anxiety   . Arthritis    "joints ache all over" (10/15/2014)  . Barrett's esophagus   . Bulging lumbar disc   . Chronic lower back pain   . DDD (degenerative disc disease), cervical   . Depression   . Drug-seeking behavior   . Headache    "weekly" (10/15/2014)  . Hyperlipemia   . Hypertension   . PTSD (post-traumatic stress disorder)   . Skin cancer    "had them cut off my arms; don't know what kind"   Past Surgical History:  Procedure Laterality Date  . ABLATION ON ENDOMETRIOSIS  2008  . BIOPSY  12/27/2018   Procedure: BIOPSY;  Surgeon: Thornton Park, MD;  Location: [facility];  Service: Gastroenterology;;  . ESOPHAGOGASTRODUODENOSCOPY (EGD) WITH PROPOFOL N/A 12/27/2018   Procedure: ESOPHAGOGASTRODUODENOSCOPY (EGD) WITH PROPOFOL;  Surgeon: Thornton Park, MD;  Location: [facility];  Service: Gastroenterology;  Laterality: N/A;  . HEMORRHOID SURGERY  ~ 2002  . IR FLUORO GUIDE CV LINE RIGHT  06/14/2020  . IR US GUIDE VASC ACCESS RIGHT  06/14/2020  . ORIF ANKLE FRACTURE Right 03/28/2020   Procedure: OPEN REDUCTION INTERNAL FIXATION (ORIF) RIGHT BIMALLEOLAR ANKLE FRACTURE;  Surgeon: Marchia Bond, MD;  Location: [facility];  Service: Orthopedics;  Laterality: Right;   Family History  Problem Relation Age of Onset  . Breast cancer Mother   . Diabetes Mother   . Breast cancer Maternal Grandmother   . Breast cancer Paternal Grandmother   . Colon polyps Paternal Grandmother   . Colon cancer Neg Hx   . Esophageal cancer Neg  Hx   . Rectal cancer Neg Hx   . Stomach cancer Neg Hx    Social History:  reports that she has never smoked. She has never used smokeless tobacco. She reports previous alcohol use. She reports that she does not use drugs. Allergies:  Allergies  Allergen Reactions  . Lactose Intolerance (Gi) Nausea Only   Medications Prior to Admission  Medication Sig Dispense Refill  . ALPRAZOLAM PO Take 1 tablet by mouth daily.    . ferrous sulfate 325 (65 FE) MG tablet Take 325 mg by mouth daily with breakfast.    . FLUoxetine (PROZAC) 40 MG capsule Take 40 mg by mouth daily.     . hydrochlorothiazide (HYDRODIURIL) 25 MG tablet Take 1 tablet (25 mg total) by mouth at bedtime.    . lamoTRIgine (LAMICTAL) 200 MG tablet Take 100-200 mg by mouth See admin instructions. 200mg  in the morning and 100mg  in the evening    . lisinopril (ZESTRIL) 10 MG tablet Take 1 tablet (10 mg total) by mouth daily.    Marland Kitchen omeprazole (PRILOSEC) 20 MG capsule Take 1 capsule (20 mg total) by mouth daily. 30 capsule 0  . risperiDONE (RISPERDAL) 0.5 MG tablet Take 0.5 mg by mouth 2 (two) times daily.  2  . tiZANidine (ZANAFLEX) 4 MG tablet Take 12 mg by  mouth 3 (three) times daily.    . traZODone (DESYREL) 150 MG tablet Take 150 mg by mouth at bedtime.       Drug Regimen Review Drug regimen was reviewed and remains appropriate with no significant issues identified  Home: Home Living Family/patient expects to be discharged to:: Private residence Living Arrangements: Spouse/significant other,Children Available Help at Discharge: Family,Available 24 hours/day Type of Home: House Home Access: Stairs to enter,Other (comment) Entrance Stairs-Number of Steps: 2 Entrance Stairs-Rails: None Home Layout: One level Bathroom Shower/Tub: Chiropodist: Standard Bathroom Accessibility: Yes Home Equipment: Environmental consultant - 2 wheels,Wheelchair - manual (reported on 04/2020 thay pt had tub bench and BSC) Additional Comments:  Information different from previous stay in Jan 2022  Lives With: Spouse   Functional History: Prior Function Level of Independence: Needs assistance ADL's / Homemaking Assistance Needed: needed assistance Comments: Pt possibly with unreliable info as pt with differing info from last admission in Jan 2022  Functional Status:  Mobility: Bed Mobility Overal bed mobility: Needs Assistance Bed Mobility: Supine to Sit Rolling: Supervision Supine to sit: Min guard,HOB elevated Sit to supine: Min guard General bed mobility comments: Assist for safety and lines. Incr time to perform Transfers Overall transfer level: Needs assistance Equipment used: Rolling walker (2 wheeled) Transfers: Sit to/from Stand Sit to Stand: Min assist Stand pivot transfers: Min guard General transfer comment: Assist for balance. Verbal cues for hand placement. Ambulation/Gait Ambulation/Gait assistance: Min assist Gait Distance (Feet): 60 Feet Assistive device: Rolling walker (2 wheeled) Gait Pattern/deviations: Step-through pattern,Decreased stride length,Trunk flexed,Wide base of support General Gait Details: Assist for balance and support. Verbal cues for posture. Gait velocity: decr Gait velocity interpretation: 1.31 - 2.62 ft/sec, indicative of limited community ambulator    ADL: ADL Overall ADL's : Needs assistance/impaired Eating/Feeding: Sitting Eating/Feeding Details (indicate cue type and reason): modified; thickened liquids; pt asking for water Grooming: Set up,Supervision/safety,Sitting Upper Body Bathing: Set up,Supervision/ safety,Sitting Lower Body Bathing: Moderate assistance,Sit to/from stand Upper Body Dressing : Minimal assistance,Sitting Lower Body Dressing: Moderate assistance,Sit to/from stand Toilet Transfer: Minimal Office manager Details (indicate cue type and reason): +2 simulated to recliner Toileting- Clothing Manipulation and Hygiene: Minimal  assistance,Sit to/from stand,Sitting/lateral lean Functional mobility during ADLs: Minimal assistance,Rolling walker,Cueing for safety General ADL Comments: Pt limited by varying levels of arousal and decreased cognition; decreased strength and ability to care for self.  Cognition: Cognition Overall Cognitive Status: Impaired/Different from baseline Orientation Level: Oriented to person,Oriented to place,Disoriented to time Cognition Arousal/Alertness: Awake/alert Behavior During Therapy: Flat affect Overall Cognitive Status: Impaired/Different from baseline Area of Impairment: Attention,Memory,Following commands,Safety/judgement,Awareness,Problem solving Orientation Level: Disoriented to,Time Current Attention Level: Selective Memory: Decreased short-term memory Following Commands: Follows one step commands consistently Safety/Judgement: Decreased awareness of safety,Decreased awareness of deficits Awareness: Emergent Problem Solving: Slow processing,Decreased initiation,Difficulty sequencing,Requires verbal cues General Comments: Aware of month but does not know if it is the beginning or end of Febuary; very slow processing  Physical Exam: Blood pressure (!) 141/86, pulse (!) 102, temperature 99.4 F (37.4 C), temperature source Oral, resp. rate 18, height 5\' 6"  (1.676 m), weight 81.1 kg, last menstrual period 06/03/2018, SpO2 94 %. Physical Exam Vitals and nursing note reviewed. Exam conducted with a chaperone present.  Constitutional:      Comments: Pt sitting up in bedside chair- anoxic/VERY flat appearing, monotone voice, husband at bedside throughout, NAD  HENT:     Head: Normocephalic and atraumatic.     Comments: Nasogastric tube in place. Cortrak in  place Cannot/willnot smile- tongue midline; doesn't appear to have any asymmetry to face    Right Ear: External ear normal.     Left Ear: External ear normal.     Nose: Nose normal. No congestion.     Mouth/Throat:      Mouth: Mucous membranes are dry.     Pharynx: Oropharynx is clear. No oropharyngeal exudate.  Eyes:     General:        Right eye: No discharge.        Left eye: No discharge.     Extraocular Movements: Extraocular movements intact.  Cardiovascular:     Comments: Tachycardic- regular rhythm- rate low 100s;  Of note, BP 732K-025K systolic Pulmonary:     Comments: CTA B/L- no W/R/R- good air movement Abdominal:     Comments: Soft, NT, ND, (+)BS -hypoactive- c/o nausea "all the time"  Genitourinary:    Comments: purewick in place Musculoskeletal:     Cervical back: Normal range of motion. No rigidity.     Comments: UEs- poor compliance to coomands and  poor effort MS: at least 4/5 in UEs- finger abduction slightly less, but would not try during exam LEs- RLE- HF 3+/5, KE 3+/5, at least 4/5 distally LLE- HF 3-/5, KE 3-/5, DF and PF 4/5  stronger on R side than L side in LEs   Skin:    General: Skin is warm and dry.     Comments: Splotchy red splotches on 1st to 4th toes on L foot- new Dry elbows IV L hand- looks OK.  Big bruise on R lower abdomen with scattered bruising on abdomen from SQ Heparin as well as on UEs B/L  Neurological:     Comments: Patient is alert.  Mood is flat but appropriate.  Makes eye contact with examiner.  Provides her name and age.  Follows simple commands.  She cannot recall full events of her hospital stay. Appears anoxic- extremely flat affect; monotone Poor memory Ox1-2 Can't remember when had BMs last or when vomited last.  Needed constant cues to participate at all in exam  Psychiatric:     Comments: Flat, anoxic?     Results for orders placed or performed during the hospital encounter of 06/08/20 (from the past 48 hour(s))  Glucose, capillary     Status: Abnormal   Collection Time: 06/21/20  7:37 AM  Result Value Ref Range   Glucose-Capillary 113 (H) 70 - 99 mg/dL    Comment: Glucose reference range applies only to samples taken after fasting  for at least 8 hours.  Glucose, capillary     Status: Abnormal   Collection Time: 06/21/20 11:40 AM  Result Value Ref Range   Glucose-Capillary 130 (H) 70 - 99 mg/dL    Comment: Glucose reference range applies only to samples taken after fasting for at least 8 hours.  Glucose, capillary     Status: Abnormal   Collection Time: 06/21/20  4:44 PM  Result Value Ref Range   Glucose-Capillary 103 (H) 70 - 99 mg/dL    Comment: Glucose reference range applies only to samples taken after fasting for at least 8 hours.  Glucose, capillary     Status: Abnormal   Collection Time: 06/21/20  9:13 PM  Result Value Ref Range   Glucose-Capillary 149 (H) 70 - 99 mg/dL    Comment: Glucose reference range applies only to samples taken after fasting for at least 8 hours.  Glucose, capillary     Status: Abnormal  Collection Time: 06/22/20  2:40 AM  Result Value Ref Range   Glucose-Capillary 152 (H) 70 - 99 mg/dL    Comment: Glucose reference range applies only to samples taken after fasting for at least 8 hours.  Renal function panel     Status: Abnormal   Collection Time: 06/22/20  2:48 AM  Result Value Ref Range   Sodium 139 135 - 145 mmol/L   Potassium 3.4 (L) 3.5 - 5.1 mmol/L   Chloride 102 98 - 111 mmol/L   CO2 24 22 - 32 mmol/L   Glucose, Bld 154 (H) 70 - 99 mg/dL    Comment: Glucose reference range applies only to samples taken after fasting for at least 8 hours.   BUN 24 (H) 6 - 20 mg/dL   Creatinine, Ser 2.24 (H) 0.44 - 1.00 mg/dL   Calcium 9.0 8.9 - 10.3 mg/dL   Phosphorus 4.6 2.5 - 4.6 mg/dL   Albumin 2.5 (L) 3.5 - 5.0 g/dL   GFR, Estimated 26 (L) >60 mL/min    Comment: (NOTE) Calculated using the CKD-EPI Creatinine Equation (2021)    Anion gap 13 5 - 15    Comment: Performed at [facility] [address] [place], [place] [address]  Glucose, capillary     Status: Abnormal   Collection Time: 06/22/20  4:55 AM  Result Value Ref Range   Glucose-Capillary 144 (H) 70 - 99 mg/dL     Comment: Glucose reference range applies only to samples taken after fasting for at least 8 hours.  Glucose, capillary     Status: Abnormal   Collection Time: 06/22/20  7:48 AM  Result Value Ref Range   Glucose-Capillary 140 (H) 70 - 99 mg/dL    Comment: Glucose reference range applies only to samples taken after fasting for at least 8 hours.  Glucose, capillary     Status: Abnormal   Collection Time: 06/22/20 11:09 AM  Result Value Ref Range   Glucose-Capillary 120 (H) 70 - 99 mg/dL    Comment: Glucose reference range applies only to samples taken after fasting for at least 8 hours.  Glucose, capillary     Status: Abnormal   Collection Time: 06/22/20  4:09 PM  Result Value Ref Range   Glucose-Capillary 105 (H) 70 - 99 mg/dL    Comment: Glucose reference range applies only to samples taken after fasting for at least 8 hours.  Glucose, capillary     Status: Abnormal   Collection Time: 06/22/20  8:17 PM  Result Value Ref Range   Glucose-Capillary 137 (H) 70 - 99 mg/dL    Comment: Glucose reference range applies only to samples taken after fasting for at least 8 hours.  Glucose, capillary     Status: Abnormal   Collection Time: 06/22/20 11:57 PM  Result Value Ref Range   Glucose-Capillary 133 (H) 70 - 99 mg/dL    Comment: Glucose reference range applies only to samples taken after fasting for at least 8 hours.  Glucose, capillary     Status: Abnormal   Collection Time: 06/23/20  4:03 AM  Result Value Ref Range   Glucose-Capillary 150 (H) 70 - 99 mg/dL    Comment: Glucose reference range applies only to samples taken after fasting for at least 8 hours.   No results found.     Medical Problem List and Plan: 1. Decreased functional mobility secondary to acute hypoxic respiratory failure with compromised airway in the setting of cardiac arrest/CPR and aspiration pneumonitis. Extubated  2/20- with likely anoxic brain injury due to  3 minutes minimum  of asystole -patient may   shower  -ELOS/Goals: goals Supervision to min A- 10-12 days? 2.  Antithrombotics: -DVT/anticoagulation: Subcutaneous heparin  -antiplatelet therapy: N/A 3. Pain Management: Tylenol as needed- NO ZANAFLEX or other muscle relaxants- pt has specific recent hx of addiction to Zanaflex.  4. Mood/PTSD: Prozac 40 mg daily. Follow-up psychiatry services as needed  -antipsychotic agents: Abilify 5 mg daily 5. Neuropsych: This patient is? capable of making decisions on her own behalf. 6. Skin/Wound Care: Routine skin checks 7. Fluids/Electrolytes/Nutrition: Routine in and outs with follow-up chemistries 8. AKI secondary to ischemic ATN following cardiac arrest. CRRT 06/08/2020 transition to hemodialysis 06/15/2020 currently on hold latest creatinine 1.81. Follow-up hemodialysis as needed- when possible, suggest switching to Lovenox? 9. Anemia of critical illness. Small hematochezia and stool morning of 06/16/2020. Follow-up gastroenterology services as needed. Continue Protonix 10. Hypertension with new Hypotension. Continue ProAmatine 10 mg 3 times daily- I suggest since BP running 157W-620B systolic, that it be decreased to 7.5 mg TID.  11. Prediabetes. Hemoglobin A1c 6.1. SSI 12. Dysphagia. Dysphagia #2 thin liquid diet. Follow-up speech therapy- pt has Cortrak- is "picky' per husband- and doesn't eat much- explained to pt will need to get out before rehab d/c.  13. Right ankle fracture status post ORIF 03/28/2020. Weightbearing as tolerated now. Follow-up per Dr. Mardelle Matte 14. Substance abuse. UDS positive for amphetamines. Not prescribed. Outpatient follow-up- also per husband abusing 64 mg Zanaflex daily prior to admission- doesn't want her to have pain meds or muscle relaxants unless absolutely needed.  15. Recent COVID-19 infection. Tested +1/18. Patient did not require treatment or hospitalization. Outside the window of isolation. 16. Class I obesity. BMI 30.74. Dietary follow-up 17. Bowel and bladder  incontinence- new since hospitalization-  18. Chronic nausea- on IV Reglan- will change to PO- is chronic- Reglan is new- might do best to wean as tolerated.   Lavon Paganini Angiulli, PA-C 06/23/2020    I have personally performed a face to face diagnostic evaluation of this patient and formulated the key components of the plan.  Additionally, I have personally reviewed laboratory data, imaging studies, as well as relevant notes and concur with the physician assistant's documentation above.   The patient's status has not changed from the original H&P.  Any changes in documentation from the acute care chart have been noted above.

## 2020-06-23 NOTE — Discharge Summary (Signed)
Physician Discharge Summary  [name]:[id number] DOB: 07-06-1970 DOA: 06/08/2020  PCP: Andreas Blower, MD  Admit date: 06/08/2020 Discharge date: 06/23/2020 30 Day Unplanned Readmission Risk Score   Flowsheet Row ED to Hosp-Admission (Current) from 06/08/2020 in [facility]  30 Day Unplanned Readmission Risk Score (%) 35.4 Filed at 06/23/2020 0801     This score is the patient's risk of an unplanned readmission within 30 days of being discharged (0 -100%). The score is based on dignosis, age, lab data, medications, orders, and past utilization.   Low:  0-14.9   Medium: 15-21.9   High: 22-29.9   Extreme: 30 and above         Admitted From: Home Disposition: CIR  Recommendations for Outpatient Follow-up:  1. Follow up with PCP in 1-2 weeks 2. Please obtain BMP/CBC in one week 3. Please follow up with your PCP on the following pending results: Unresulted Labs (From admission, onward)          Start     Ordered   06/15/20 0500  Renal function panel  Daily,   R     Question:  Specimen collection method  Answer:  Unit=Unit collect   06/14/20 0909   Signed and Held  CBC  (heparin)  Once,   R       Comments: Baseline for heparin therapy IF NOT ALREADY DRAWN.  Notify MD if PLT < 100 K.   Question:  Specimen collection method  Answer:  Lab=Lab collect   Signed and Held   Signed and Held  Creatinine, serum  (heparin)  Once,   R       Comments: Baseline for heparin therapy IF NOT ALREADY DRAWN.   Question:  Specimen collection method  Answer:  Lab=Lab collect   Signed and Held   Signed and Held  Comprehensive metabolic panel  Tomorrow morning,   R       Question:  Specimen collection method  Answer:  Lab=Lab collect   Signed and Held   Signed and Held  CBC WITH DIFFERENTIAL  Tomorrow morning,   R       Question:  Specimen collection method  Answer:  Lab=Lab collect   Signed and Held            Home Health: None Equipment/Devices:  None  Discharge Condition: Stable CODE STATUS: Full code Diet recommendation: Cardiac  Subjective: Seen and examined.  Some complaint of nausea and intermittent vomiting but no abdominal pain and has been having bowel movements and passing flatus.  No other complaint.  Brief/Interim Summary: 50 year old F with PMH of PTSD, depression, anxiety, HTN, OA on chronic opiate and recent COVID-19 infection brought to ED due to cardiac arrest/asystole with successful ROSC with 3 minutes of CPR, epinephrine and King airway.  She was admitted to ICU for cardiac arrest/shock, acute hypoxemic respiratory failure, aspiration pneumonia, severe acidosis, AKI secondary to ATN following cardiac arrest and shock, hypothermia and hypokalemia.  She was intubated 2/16-2/20. Started on CRRT and transitioned to IHD on 06/15/20.  Received IV Zosyn from 2/16-2/22. Eventually transferred to [facility] service on 2/24 for further care.  UDS was positive for amphetamine on admission but she denies use.  TTE without significant finding.  Once off of the pressors, she was started on midodrine.  She was also seen by psychiatry.  They recommended discontinuing all her PTA medications for psychiatry except Prozac and she was started on Abilify.  Patient had drop in her  hemoglobin.  She also had some hematochezia likely from hemorrhoid.  GI consulted but they did not recommend any scope for her.  She was transfused 1 unit with appropriate response, and H&H remained stable.  Post extubation, patient continued to have some sort of dysphagia and she was only on dysphagia type II but was not having enough p.o. intake so she was started on cortrak tube feedings.  Fortunately, after receiving her last dialysis on 06/17/2020, patient's renal function started to improve with good urine output and she did not require any further transfusion.  Her creatinine is just over 1 today.  Nephrology was following her until today.  Her TDC was removed today  since nephrology opines that she does not need any further hemodialysis.  She was evaluated by physical and occupational therapy who recommended CIR.  Since last 1 to 2 days, patient has intermittent nausea and vomiting.  Her tube feedings were switched from nocturnal to 24 hours.  Frequency was reduced.  She was started on Reglan 5 mg 3 times daily.  Abdominal x-ray today ruled out any ileus.  She has been having bowel movements and passing flatus as well.  Reglan has been increased to 10 mg 3 times daily today.  She will be discharged in stable condition to CIR today and will continue to feedings.  I hope that her symptoms of nausea will improve once she will be more mobile.  Discharge Diagnoses:  Active Problems:   Cardiac arrest [facility])   Acute respiratory failure with hypoxia (HCC)   Non-traumatic rhabdomyolysis   Hematochezia   Acute blood loss anemia    Discharge Instructions   Allergies as of 06/23/2020      Reactions   Lactose Intolerance (gi) Nausea Only      Medication List    STOP taking these medications   ALPRAZOLAM PO   hydrochlorothiazide 25 MG tablet Commonly known as: HYDRODIURIL   lamoTRIgine 200 MG tablet Commonly known as: LAMICTAL   lisinopril 10 MG tablet Commonly known as: ZESTRIL   risperiDONE 0.5 MG tablet Commonly known as: RISPERDAL   traZODone 150 MG tablet Commonly known as: DESYREL     TAKE these medications   ARIPiprazole 5 MG tablet Commonly known as: ABILIFY Place 1 tablet (5 mg total) into feeding tube daily. Start taking on: June 24, 2020   ferrous sulfate 325 (65 FE) MG tablet Take 325 mg by mouth daily with breakfast.   FLUoxetine 40 MG capsule Commonly known as: PROZAC Take 40 mg by mouth daily.   metoCLOPramide 10 MG tablet Commonly known as: REGLAN Take 1 tablet (10 mg total) by mouth 3 (three) times daily with meals.   midodrine 10 MG tablet Commonly known as: PROAMATINE Place 1 tablet (10 mg total) into feeding  tube 3 (three) times daily with meals.   omeprazole 20 MG capsule Commonly known as: PRILOSEC Take 1 capsule (20 mg total) by mouth daily.   tiZANidine 4 MG tablet Commonly known as: ZANAFLEX Take 12 mg by mouth 3 (three) times daily.       Follow-up Information    Andreas Blower, MD Follow up in 1 week(s).   Specialty: Family Medicine Contact information: [place] [address] [phone]              Allergies  Allergen Reactions  . Lactose Intolerance (Gi) Nausea Only    Consultations: PCCM, GI, nephrology, psychiatry   Procedures/Studies: CT Head Wo Contrast  Result Date: 06/08/2020 CLINICAL  DATA:  Found on the floor. EXAM: CT HEAD WITHOUT CONTRAST TECHNIQUE: Contiguous axial images were obtained from the base of the skull through the vertex without intravenous contrast. COMPARISON:  03/21/2020 FINDINGS: Brain: The brain has a normal appearance without evidence of malformation, atrophy, old or acute small or large vessel infarction, mass lesion, hemorrhage, hydrocephalus or extra-axial collection. Vascular: Major vessels at the base of the brain show flow. Venous sinuses appear patent. Skull and upper cervical spine: Normal. Sinuses/Orbits: Small amount of layering fluid in the right maxillary sinus. Other: None significant. IMPRESSION: 1. Normal appearance of the brain itself. 2. Small amount of layering fluid in the right maxillary sinus. Electronically Signed   By: Nelson Chimes M.D.   On: 06/08/2020 18:29   US RENAL  Result Date: 06/08/2020 CLINICAL DATA:  Acute renal insufficiency EXAM: RENAL / URINARY TRACT ULTRASOUND COMPLETE COMPARISON:  05/10/2020 FINDINGS: Right Kidney: Renal measurements: 8.5 x 4.7 x 4.2 cm = volume: 86.8 mL. Echogenicity within normal limits. No mass or hydronephrosis visualized. Left Kidney: Renal measurements: 10.3 x 4.5 x 4.2 cm = volume: 102.3 mL. Echogenicity within normal limits. No mass or hydronephrosis visualized.  Bladder: Bladder is decompressed, limiting its evaluation. Other: None. IMPRESSION: Unremarkable renal ultrasound. Electronically Signed   By: Randa Ngo M.D.   On: 06/08/2020 22:42   IR US Guide Vasc Access Right  Result Date: 06/14/2020 INDICATION: End-stage renal disease. In need of durable intravenous access for continuation hemodialysis. Patient with history placement of a non tunneled/temporary right jugular approach dialysis catheter, recently removed. EXAM: TUNNELED CENTRAL VENOUS HEMODIALYSIS CATHETER PLACEMENT WITH ULTRASOUND AND FLUOROSCOPIC GUIDANCE MEDICATIONS: Patient is currently admitted to the hospital receiving intravenous antibiotics. The antibiotic was given in an appropriate time interval prior to skin puncture. ANESTHESIA/SEDATION: None FLUOROSCOPY TIME:  18 seconds (4 mGy) COMPLICATIONS: None immediate. PROCEDURE: Informed written consent was obtained from the patient after a discussion of the risks, benefits, and alternatives to treatment. Questions regarding the procedure were encouraged and answered. The right neck and chest were prepped with chlorhexidine in a sterile fashion, and a sterile drape was applied covering the operative field. Maximum barrier sterile technique with sterile gowns and gloves were used for the procedure. A timeout was performed prior to the initiation of the procedure. After creating a small venotomy incision, a micropuncture kit was utilized to access the internal jugular vein. Note was made of a minimal amount nonocclusive thrombus within the right internal jugular vein, likely the sequela of recent dialysis catheter placement. Real-time ultrasound guidance was utilized for vascular access including the acquisition of a permanent ultrasound image documenting patency of the accessed vessel. The microwire was utilized to measure appropriate catheter length. A stiff Glidewire was advanced to the level of the IVC and the micropuncture sheath was exchanged for  a peel-away sheath. A palindrome tunneled hemodialysis catheter measuring 19 cm from tip to cuff was tunneled in a retrograde fashion from the anterior chest wall to the venotomy incision. The catheter was then placed through the peel-away sheath with tips ultimately positioned within the superior aspect of the right atrium. Final catheter positioning was confirmed and documented with a spot radiographic image. The catheter aspirates and flushes normally. The catheter was flushed with appropriate volume heparin dwells. The catheter exit site was secured with a 0-Prolene retention suture. The venotomy incision was closed with Dermabond and Steri-strips. Dressings were applied. The patient tolerated the procedure well without immediate post procedural complication. IMPRESSION: Successful placement of 19 cm tip  to cuff tunneled hemodialysis catheter via the right internal jugular vein with tips terminating within the superior aspect of the right atrium. The catheter is ready for immediate use. Electronically Signed   By: Sandi Mariscal M.D.   On: 06/14/2020 15:57   DG Chest Port 1 View  Result Date: 06/11/2020 CLINICAL DATA:  Respiratory failure. EXAM: PORTABLE CHEST 1 VIEW COMPARISON:  06/10/2020 FINDINGS: Left IJ catheter tip is at the cavoatrial junction. Right IJ catheter tip is in the distal SVC. ETT tip is just above the carina. Feeding tube tip is well below the GE junction and is either in the distal stomach or proximal duodenum. Stable cardiomediastinal contours. Low lung volumes with asymmetric elevation of the right hemidiaphragm. Atelectasis is noted in the right lung base. IMPRESSION: Low lung volumes with asymmetric elevation of the right hemidiaphragm and right base atelectasis. Electronically Signed   By: Kerby Moors M.D.   On: 06/11/2020 07:40   DG Chest Port 1 View  Result Date: 06/10/2020 CLINICAL DATA:  Respiratory failure EXAM: PORTABLE CHEST 1 VIEW COMPARISON:  06/09/2020 FINDINGS: Support  devices are stable. Patient is rotated to the right, limiting study. Cardiomegaly. Bibasilar atelectasis, right greater than left. No visible effusions or acute bony abnormality. IMPRESSION: Limited study due to rotation. Bibasilar atelectasis. Support devices stable. Electronically Signed   By: Rolm Baptise M.D.   On: 06/10/2020 07:19   DG Chest Port 1 View  Result Date: 06/09/2020 CLINICAL DATA:  Respiratory failure. EXAM: PORTABLE CHEST 1 VIEW COMPARISON:  06/08/2020. FINDINGS: Endotracheal tube, left IJ line, right IJ line in stable position. Heart size stable. Low lung volumes with bibasilar atelectasis again noted. Bibasilar infiltrates cannot be excluded. Stable elevation right hemidiaphragm. No pleural effusion or pneumothorax. IMPRESSION: 1. Lines and tubes in stable position. 2. Low lung volumes with bibasilar atelectasis again noted. Bibasilar infiltrates cannot be excluded. Exam stable from prior exam. Electronically Signed   By: Marcello Moores  Register   On: 06/09/2020 05:26   DG CHEST PORT 1 VIEW  Result Date: 06/08/2020 CLINICAL DATA:  Central line placement. EXAM: PORTABLE CHEST 1 VIEW COMPARISON:  Radiograph 06/09/2019 FINDINGS: Right-sided dialysis catheter tip in the mid SVC. Left central line and endotracheal tube remain in place. No pneumothorax. Elevation of the right hemidiaphragm with basilar atelectasis, unchanged. Possible small right pleural effusion. Stable heart size and mediastinal contours. IMPRESSION: Right-sided dialysis catheter tip in the mid SVC. No pneumothorax. Otherwise stable exam. Electronically Signed   By: Keith Rake M.D.   On: 06/08/2020 20:41   DG Chest Portable 1 View  Result Date: 06/08/2020 CLINICAL DATA:  Central catheter placement.  Hypoxia. EXAM: PORTABLE CHEST 1 VIEW COMPARISON:  June 08, 2020 study obtained earlier in the day FINDINGS: Endotracheal tube tip is 3.2 cm above the carina. Central catheter tip is at the cavoatrial junction. No  pneumothorax. There is atelectatic change in the right lower lung region with small right pleural effusion. There is stable elevation of the right hemidiaphragm. The left lung is clear. The heart size and pulmonary vascularity are normal. No adenopathy. No bone lesions IMPRESSION: Tube and catheter positions as described without pneumothorax. There is atelectatic change in the right lower lung region with small right pleural effusion. Left lung clear. Heart size within normal limits. Electronically Signed   By: Lowella Grip III M.D.   On: 06/08/2020 17:02   DG Chest Portable 1 View  Result Date: 06/08/2020 CLINICAL DATA:  Status post intubation. EXAM: PORTABLE CHEST 1 VIEW  COMPARISON:  Single-view of the chest 05/10/2020. FINDINGS: New endotracheal tube is in place with the tip 2.3 cm above the carina. Defibrillator pad also noted. Mild subsegmental atelectasis is seen in the right lung base. Lungs otherwise clear. No pneumothorax or pleural fluid. IMPRESSION: ETT tip is 2.3 cm above the carina. Mild subsegmental atelectasis right lung base. Electronically Signed   By: Inge Rise M.D.   On: 06/08/2020 14:50   DG Abd Portable 1V  Result Date: 06/16/2020 CLINICAL DATA:  Abdominal pain EXAM: PORTABLE ABDOMEN - 1 VIEW COMPARISON:  May 11, 2020 FINDINGS: Enteric tube tip is in the distal stomach. There is contrast in the stomach. There also foci of contrast in the distal colon. There is no bowel dilatation or air-fluid level to suggest bowel obstruction. No free air. Central catheter tip is at the cavoatrial junction. Lung bases are clear. IMPRESSION: No bowel obstruction or free air evident. Enteric tube tip in distal stomach. Lung bases clear. Electronically Signed   By: Lowella Grip III M.D.   On: 06/16/2020 13:26   DG Swallowing Func-Speech Pathology  Result Date: 06/15/2020 Objective Swallowing Evaluation: Type of Study: MBS-Modified Barium Swallow Study  Patient Details Name: LEXIANA SPINDEL MRN: [id number] Date of Birth: [birth year] Today's Date: 06/15/2020 Time: SLP Start Time (ACUTE ONLY): 1439 -SLP Stop Time (ACUTE ONLY): 1500 SLP Time Calculation (min) (ACUTE ONLY): 21 min Past Medical History: Past Medical History: Diagnosis Date . Anxiety  . Arthritis   "joints ache all over" (10/15/2014) . Bulging lumbar disc  . Chronic lower back pain  . DDD (degenerative disc disease), cervical  . Depression  . Drug-seeking behavior  . Headache   "weekly" (10/15/2014) . Hyperlipemia  . Hypertension  . PTSD (post-traumatic stress disorder)  . Skin cancer   "had them cut off my arms; don't know what kind" Past Surgical History: Past Surgical History: Procedure Laterality Date . ABLATION ON ENDOMETRIOSIS  2008 . BIOPSY  12/27/2018  Procedure: BIOPSY;  Surgeon: Thornton Park, MD;  Location: [facility];  Service: Gastroenterology;; . ESOPHAGOGASTRODUODENOSCOPY (EGD) WITH PROPOFOL N/A 12/27/2018  Procedure: ESOPHAGOGASTRODUODENOSCOPY (EGD) WITH PROPOFOL;  Surgeon: Thornton Park, MD;  Location: [facility];  Service: Gastroenterology;  Laterality: N/A; . HEMORRHOID SURGERY  ~ 2002 . IR FLUORO GUIDE CV LINE RIGHT  06/14/2020 . IR US GUIDE VASC ACCESS RIGHT  06/14/2020 . ORIF ANKLE FRACTURE Right 03/28/2020  Procedure: OPEN REDUCTION INTERNAL FIXATION (ORIF) RIGHT BIMALLEOLAR ANKLE FRACTURE;  Surgeon: Marchia Bond, MD;  Location: [place];  Service: Orthopedics;  Laterality: Right; HPI: [name] who apparently fell out of bed during the night and was found to be in asystole, and she was intubated and underwent chest compressions as well as EPI with ROSC.  Pt was admitted for COVID-19 in mid January.  Pt was intubated from 2/16 - 2/20.  Subjective: Pt was alert and mildly confused Assessment / Plan / Recommendation CHL IP CLINICAL IMPRESSIONS 06/15/2020 Clinical Impression Pt presents with oropharyngeal dysphagia characterized by decreased bolus cohesion and a pharyngeal delay. She  demonstrated premature spillage to the valleculae and pyriform sinuses, penetration (PAS 3) of thin liquids via straw, and aspiration (PAS 7) of thin liquids when consecutive swallows were used.  Aspiration resulted in coughing, but this was ineffective in expelling the aspirated material. Throat clearing was intermittently noted while the fluoro was off and it is anticipated that at least some instances of penetration ultimately resulted in aspiration.The barium tablet became lodged  in the valleculae and this appeared to be at least partly due to the [facility]. A dysphagia 2 diet with nectar thick liquids will be initiated at this time. Pt has denied a history of dysphagia and it is anticipated that the pt's dysphagia is temporary secondary to prolonged intubation. SLP will follow to assess improvement in swallow function and for diet advancement. SLP Visit Diagnosis Dysphagia, oropharyngeal phase (R13.12) Attention and concentration deficit following -- Frontal lobe and executive function deficit following -- Impact on safety and function Mild aspiration risk   CHL IP TREATMENT RECOMMENDATION 06/15/2020 Treatment Recommendations Therapy as outlined in treatment plan below   Prognosis 06/15/2020 Prognosis for Safe Diet Advancement Good Barriers to Reach Goals -- Barriers/Prognosis Comment -- CHL IP DIET RECOMMENDATION 06/15/2020 SLP Diet Recommendations Dysphagia 2 (Fine chop) solids;Nectar thick liquid Liquid Administration via Cup;Straw Medication Administration Whole meds with puree Compensations Slow rate;Small sips/bites Postural Changes Seated upright at 90 degrees   CHL IP OTHER RECOMMENDATIONS 06/15/2020 Recommended Consults -- Oral Care Recommendations Oral care BID Other Recommendations --   CHL IP FOLLOW UP RECOMMENDATIONS 06/15/2020 Follow up Recommendations Other (comment)   CHL IP FREQUENCY AND DURATION 06/15/2020 Speech Therapy Frequency (ACUTE ONLY) min 2x/week Treatment Duration 2 weeks      CHL IP ORAL  PHASE 06/15/2020 Oral Phase Impaired Oral - Pudding Teaspoon -- Oral - Pudding Cup -- Oral - Honey Teaspoon -- Oral - Honey Cup -- Oral - Nectar Teaspoon -- Oral - Nectar Cup -- Oral - Nectar Straw Decreased bolus cohesion;Premature spillage Oral - Thin Teaspoon -- Oral - Thin Cup Decreased bolus cohesion;Premature spillage Oral - Thin Straw Decreased bolus cohesion;Premature spillage Oral - Puree WFL Oral - Mech Soft WFL Oral - Regular -- Oral - Multi-Consistency -- Oral - Pill -- Oral Phase - Comment --  CHL IP PHARYNGEAL PHASE 06/15/2020 Pharyngeal Phase Impaired Pharyngeal- Pudding Teaspoon -- Pharyngeal -- Pharyngeal- Pudding Cup -- Pharyngeal -- Pharyngeal- Honey Teaspoon -- Pharyngeal -- Pharyngeal- Honey Cup -- Pharyngeal -- Pharyngeal- Nectar Teaspoon -- Pharyngeal -- Pharyngeal- Nectar Cup -- Pharyngeal -- Pharyngeal- Nectar Straw Delayed swallow initiation-vallecula Pharyngeal -- Pharyngeal- Thin Teaspoon -- Pharyngeal -- Pharyngeal- Thin Cup Delayed swallow initiation-vallecula;Delayed swallow initiation-pyriform sinuses Pharyngeal -- Pharyngeal- Thin Straw Delayed swallow initiation-vallecula;Delayed swallow initiation-pyriform sinuses;Penetration/Aspiration during swallow;Penetration/Apiration after swallow Pharyngeal Material enters airway, remains ABOVE vocal cords and not ejected out;Material enters airway, passes BELOW cords and not ejected out despite cough attempt by patient Pharyngeal- Puree Delayed swallow initiation-vallecula Pharyngeal -- Pharyngeal- Mechanical Soft Delayed swallow initiation-vallecula Pharyngeal -- Pharyngeal- Regular -- Pharyngeal -- Pharyngeal- Multi-consistency -- Pharyngeal -- Pharyngeal- Pill Delayed swallow initiation-vallecula;Pharyngeal residue - valleculae Pharyngeal -- Pharyngeal Comment --  CHL IP CERVICAL ESOPHAGEAL PHASE 06/15/2020 Cervical Esophageal Phase WFL Pudding Teaspoon -- Pudding Cup -- Honey Teaspoon -- Honey Cup -- Nectar Teaspoon -- Nectar Cup -- Nectar  Straw -- Thin Teaspoon -- Thin Cup -- Thin Straw -- Puree -- Mechanical Soft -- Regular -- Multi-consistency -- Pill -- Cervical Esophageal Comment -- [name], [place], [place] Office number [phone] Pager [phone] Horton Marshall 06/15/2020, 3:35 PM              ECHOCARDIOGRAM COMPLETE  Result Date: 06/09/2020    ECHOCARDIOGRAM REPORT   Patient Name:   KELSEI DEFINO Date of Exam: 06/09/2020 Medical Rec #:  [id number]       Height:       66.0 in Accession #:    [id number]  Weight:       194.0 lb Date of Birth:  06-03-1970      BSA:          1.974 m Patient Age:    28 years        BP:           122/78 mmHg Patient Gender: F               HR:           101 bpm. Exam Location:  Inpatient Procedure: 2D Echo Indications:    Cardiac Arrest I46.9  History:        Patient has prior history of Echocardiogram examinations, most                 recent 12/13/2014. Risk Factors:Hypertension and Dyslipidemia.  Sonographer:    Mikki Santee RDCS (AE) Referring Phys: [id number] VINEET SOOD IMPRESSIONS  1. Left ventricular ejection fraction, by estimation, is 55 to 60%. The left ventricle has normal function. The left ventricle has no regional wall motion abnormalities. Left ventricular diastolic parameters were normal.  2. Right ventricular systolic function is normal. The right ventricular size is normal.  3. The mitral valve is normal in structure. Trivial mitral valve regurgitation. No evidence of mitral stenosis.  4. The aortic valve is grossly normal. Aortic valve regurgitation is moderate. Comparison(s): Changes from prior study are noted. Now with moderate aortic regurgitation. FINDINGS  Left Ventricle: Left ventricular ejection fraction, by estimation, is 55 to 60%. The left ventricle has normal function. The left ventricle has no regional wall motion abnormalities. The left ventricular internal cavity size was normal in size. There is  no left ventricular hypertrophy.  Left ventricular diastolic parameters were normal. Right Ventricle: The right ventricular size is normal. Right vetricular wall thickness was not well visualized. Right ventricular systolic function is normal. Left Atrium: Left atrial size was normal in size. Right Atrium: Right atrial size was normal in size. Pericardium: There is no evidence of pericardial effusion. Mitral Valve: The mitral valve is normal in structure. Trivial mitral valve regurgitation. No evidence of mitral valve stenosis. Tricuspid Valve: The tricuspid valve is normal in structure. Tricuspid valve regurgitation is trivial. No evidence of tricuspid stenosis. Aortic Valve: The aortic valve is grossly normal. Aortic valve regurgitation is moderate. Aortic regurgitation PHT measures 325 msec. Pulmonic Valve: The pulmonic valve was not well visualized. Pulmonic valve regurgitation is not visualized. No evidence of pulmonic stenosis. Aorta: The aortic root, ascending aorta and aortic arch are all structurally normal, with no evidence of dilitation or obstruction. Venous: IVC assessment for right atrial pressure unable to be performed due to mechanical ventilation. IAS/Shunts: The atrial septum is grossly normal.  LEFT VENTRICLE PLAX 2D LVIDd:         4.90 cm  Diastology LVIDs:         3.20 cm  LV e' medial:    8.38 cm/s LV PW:         0.80 cm  LV E/e' medial:  11.7 LV IVS:        0.90 cm  LV e' lateral:   10.80 cm/s LVOT diam:     1.90 cm  LV E/e' lateral: 9.1 LV SV:         52 LV SV Index:   26 LVOT Area:     2.84 cm  RIGHT VENTRICLE RV S prime:     18.80 cm/s TAPSE (M-mode): 1.8 cm LEFT ATRIUM  Index       RIGHT ATRIUM          Index LA diam:        2.80 cm 1.42 cm/m  RA Area:     8.38 cm LA Vol (A2C):   41.3 ml 20.92 ml/m RA Volume:   14.10 ml 7.14 ml/m LA Vol (A4C):   23.9 ml 12.11 ml/m LA Biplane Vol: 32.1 ml 16.26 ml/m  AORTIC VALVE LVOT Vmax:   115.00 cm/s LVOT Vmean:  77.600 cm/s LVOT VTI:    0.182 m AI PHT:      325 msec   AORTA Ao Root diam: 2.70 cm MITRAL VALVE MV Area (PHT): 3.08 cm     SHUNTS MV Decel Time: 246 msec     Systemic VTI:  0.18 m MV E velocity: 98.30 cm/s   Systemic Diam: 1.90 cm MV A velocity: 126.00 cm/s MV E/A ratio:  0.78 Buford Dresser MD Electronically signed by Buford Dresser MD Signature Date/Time: 06/09/2020/3:25:05 PM    Final       Discharge Exam: Vitals:   06/23/20 0837 06/23/20 0930  BP: (!) 147/83 138/85  Pulse: (!) 117 (!) 110  Resp: 19 20  Temp: 99.3 F (37.4 C) 98.7 F (37.1 C)  SpO2: 97% 96%   Vitals:   06/22/20 2015 06/23/20 0400 06/23/20 0837 06/23/20 0930  BP: 130/83 (!) 141/86 (!) 147/83 138/85  Pulse: (!) 101 (!) 102 (!) 117 (!) 110  Resp: 20 18 19 20   Temp: 99.1 F (37.3 C) 99.4 F (37.4 C) 99.3 F (37.4 C) 98.7 F (37.1 C)  TempSrc: Oral Oral Oral Oral  SpO2: 95% 94% 97% 96%  Weight:  81.1 kg    Height:        General: Pt is alert, awake, not in acute distress, morbidly obese Cardiovascular: RRR, S1/S2 +, no rubs, no gallops Respiratory: CTA bilaterally, no wheezing, no rhonchi Abdominal: Soft, NT, ND, bowel sounds + Extremities: +1 pitting edema bilateral lower extremity, no cyanosis    The results of significant diagnostics from this hospitalization (including imaging, microbiology, ancillary and laboratory) are listed below for reference.     Microbiology: No results found for this or any previous visit (from the past 240 hour(s)).   Labs: BNP (last 3 results) Recent Labs    05/11/20 0114 05/12/20 0042 06/08/20 1944  BNP 190.0* 149.3* 95.2   Basic Metabolic Panel: Recent Labs  Lab 06/17/20 1015 06/18/20 0152 06/19/20 0303 06/20/20 0147 06/21/20 0226 06/22/20 0248 06/23/20 0312  NA 130*   < > 134* 136 138 139 140  K 4.3   < > 3.7 3.6 3.8 3.4* 3.4*  CL 93*   < > 97* 99 99 102 103  CO2 22   < > 24 23 25 24 24   GLUCOSE 141*   < > 183* 194* 175* 154* 139*  BUN 64*   < > 49* 57* 23* 24* 20  CREATININE 4.34*   < >  3.60* 3.80* 2.20* 2.24* 1.81*  CALCIUM 8.9   < > 8.7* 8.8* 8.8* 9.0 9.1  MG 1.9  --  1.9  --   --   --   --   PHOS 4.1   < > 5.0* 5.7* 4.0 4.6 4.7*   < > = values in this interval not displayed.   Liver Function Tests: Recent Labs  Lab 06/19/20 0303 06/20/20 0147 06/21/20 0226 06/22/20 0248 06/23/20 0312  ALBUMIN 2.3* 2.3* 2.3* 2.5* 2.4*   No  results for input(s): LIPASE, AMYLASE in the last 168 hours. No results for input(s): AMMONIA in the last 168 hours. CBC: Recent Labs  Lab 06/16/20 2143 06/17/20 1015 06/18/20 0201 06/19/20 0303 06/20/20 0147  WBC  --  10.3  --   --  7.3  HGB 8.7* 9.1* 8.4* 8.7* 8.3*  HCT 26.3* 27.7* 25.8* 26.9* 24.3*  MCV  --  92.0  --   --  93.5  PLT  --  295  --   --  369   Cardiac Enzymes: No results for input(s): CKTOTAL, CKMB, CKMBINDEX, TROPONINI in the last 168 hours. BNP: Invalid input(s): POCBNP CBG: Recent Labs  Lab 06/22/20 1609 06/22/20 2017 06/22/20 2357 06/23/20 0403 06/23/20 0747  GLUCAP 105* 137* 133* 150* 129*   D-Dimer No results for input(s): DDIMER in the last 72 hours. Hgb A1c No results for input(s): HGBA1C in the last 72 hours. Lipid Profile No results for input(s): CHOL, HDL, LDLCALC, TRIG, CHOLHDL, LDLDIRECT in the last 72 hours. Thyroid function studies No results for input(s): TSH, T4TOTAL, T3FREE, THYROIDAB in the last 72 hours.  Invalid input(s): FREET3 Anemia work up No results for input(s): VITAMINB12, FOLATE, FERRITIN, TIBC, IRON, RETICCTPCT in the last 72 hours. Urinalysis    Component Value Date/Time   COLORURINE AMBER (A) 06/08/2020 1512   APPEARANCEUR CLOUDY (A) 06/08/2020 1512   LABSPEC 1.023 06/08/2020 1512   PHURINE 5.0 06/08/2020 1512   GLUCOSEU NEGATIVE 06/08/2020 1512   HGBUR NEGATIVE 06/08/2020 1512   [place] 06/08/2020 1512   KETONESUR NEGATIVE 06/08/2020 1512   PROTEINUR NEGATIVE 06/08/2020 1512   UROBILINOGEN 0.2 01/19/2015 0302   NITRITE NEGATIVE 06/08/2020 1512    LEUKOCYTESUR NEGATIVE 06/08/2020 1512   Sepsis Labs Invalid input(s): PROCALCITONIN,  WBC,  LACTICIDVEN Microbiology No results found for this or any previous visit (from the past 240 hour(s)).   Time coordinating discharge: Over 30 minutes  SIGNED:   Darliss Cheney, MD  [facility] 06/23/2020, 11:14 AM  If 7PM-7AM, please contact night-coverage [email or web address]

## 2020-06-23 NOTE — Progress Notes (Signed)
Courtney Heys, MD  Physician  Physical Medicine and Rehabilitation  PMR Pre-admission      Signed  Date of Service:  06/21/2020  3:24 PM      Related encounter: ED to Hosp-Admission (Current) from 06/08/2020 in [facility]       Signed          Show:Clear all [x] Manual[x] Template[x] Copied  Added by: [x] Cristina Gong, RN[x] Courtney Heys, MD   [] Hover for details  PMR Admission Coordinator Pre-Admission Assessment   Patient: [name] is an 50 y.o., female MRN: [id number] DOB: 1970/12/11 Height: 5' 6"  (167.6 cm) Weight: 81.1 kg   Insurance Information HMO:     PPO:      PCP:      IPA:      80/20:      OTHER:  PRIMARY: Medicare a and b      Policy#: [id number]      Subscriber: pt Benefits:  Phone #: passport one online     Name: 2/22 Eff. Date: a 10/22/2014 b 02/22/2016     Deduct: $1556      Out of Pocket Max: none      Life Max: none CIR: 100%      SNF: 20 full days Outpatient: 80%     Co-Pay: 20% Home Health: 100%      Co-Pay: none DME: 80%     Co-Pay: 20% Providers: pt choice  SECONDARY: none     Financial Counselor:       Phone#:    The Therapist, art Information Summary" for patients in Inpatient Rehabilitation Facilities with attached "Privacy Act [place] Records" was provided and verbally reviewed with: Patient and Family   Emergency Contact Information         Contact Information     Name Relation Home Work Mobile    Holster,Randall Spouse     [phone]         Current Medical History  Patient Admitting Diagnosis: Debility   History of Present Illness: Harries is a 50 year old right-handed female with history of depression, opiate use, PTSD with reported prior suicide attempts maintained on Prozac, Lamictal as well as Risperdal,, hypertension, Covid +1/18, endometriosis, ORIF right ankle 03/28/2020 and is weightbearing as tolerated and has been rather sedentary since her fracture.   Presented 06/08/2020 after  reportedly falling out of the bed at some point during the night and family let her sleep on the floor when they later checked on her she was unresponsive.  First responders found her to be pulseless and asystolic.  She was intubated with a King airway and received CPR x3 minutes and epi x1 with ROSC.  Immediately post ROSC her EKG was concerning for STEMI but subsequent EKG were improved.  Cranial CT scan negative.  Chemistries revealed potassium 6.5 chloride 89 glucose 180 BUN 214 creatinine 24.91 from baseline 0.83, WBC 16,200 hemoglobin 10.6, troponin 70, urine drug screen positive amphetamines, alcohol negative, lactic acid 4.4, CK 5564, magnesium 2.7 hemoglobin A1c 6.1.  Renal ultrasound unremarkable.  Echocardiogram with ejection fraction of 55 to 60% no wall motion abnormalities.  She remained intubated 2/16-2/20.  Started on CRRT and transition to IHD on 06/15/2020 followed by renal services with latest renal function much improved creatinine 2.24-1.81 and her hemodialysis is currently on hold as she is showing renal recovery.  Chest x-ray suspicious for aspiration pneumonia completing course of Rocephin. Gastroenterology consulted 06/16/2020 for nonspecific lower abdominal pain and some bright red blood per rectum  and hemoglobin dipping to 7.1 overnight day.. It was noted patient with extensive GI work-up in 6295 for IDA and cyclic vomiting to include EGD showing non-H Pyloric gastritis, colonoscopy benign rectal erythema and VCE 1 small nonbleeding distal AVM. She was transfused 1 unit packed red blood cells gastroenterology services felt no further work-up was needed with close monitoring of hemoglobin and hematocrit. Her hemoglobin has stabilized 8.3 and she was cleared to begin subcutaneous heparin for DVT prophylaxis. Her cardiac status remained stable followed by Dr. Gwenlyn Found of cardiology services. She was maintained on midodrine for blood pressure. Psychiatry consulted for history of depression PTSD  with prior suicide attempts and currently remains on Prozac as prior to admission with the addition of Abilify. She is tolerating a dysphagia #2 thin liquid diet and currently receiving nasogastric tube feeds for nutritional support.   Patient's medical record from [facility] has been reviewed by the rehabilitation admission coordinator and physician.   Past Medical History      Past Medical History:  Diagnosis Date  . Anxiety    . Arthritis      "joints ache all over" (10/15/2014)  . Barrett's esophagus    . Bulging lumbar disc    . Chronic lower back pain    . DDD (degenerative disc disease), cervical    . Depression    . Drug-seeking behavior    . Headache      "weekly" (10/15/2014)  . Hyperlipemia    . Hypertension    . PTSD (post-traumatic stress disorder)    . Skin cancer      "had them cut off my arms; don't know what kind"      Family History   family history includes Breast cancer in her maternal grandmother, mother, and paternal grandmother; Colon polyps in her paternal grandmother; Diabetes in her mother.   Prior Rehab/Hospitalizations Has the patient had prior rehab or hospitalizations prior to admission? Yes   Has the patient had major surgery during 100 days prior to admission? No               Current Medications   Current Facility-Administered Medications:  .  0.9 %  sodium chloride infusion (Manually program via Guardrails IV Fluids), , Intravenous, Once, Gonfa, Taye T, MD .  0.9 %  sodium chloride infusion, 250 mL, Intravenous, Continuous, Truddie Hidden, MD, Stopped at 06/15/20 1409 .  acetaminophen (TYLENOL) tablet 650 mg, 650 mg, Oral, Q6H PRN, Wendee Beavers T, MD, 650 mg at 06/21/20 0720 .  ARIPiprazole (ABILIFY) tablet 5 mg, 5 mg, Per Tube, Daily, Blenda Nicely, RPH, 5 mg at 06/23/20 0850 .  chlorhexidine (PERIDEX) 0.12 % solution 15 mL, 15 mL, Mouth Rinse, BID, Halford Chessman, Vineet, MD, 15 mL at 06/23/20 0850 .  Chlorhexidine Gluconate Cloth 2 %  PADS 6 each, 6 each, Topical, Q0600, Claudia Desanctis, MD, 6 each at 06/23/20 [phone] .  Darbepoetin Alfa (ARANESP) injection 40 mcg, 40 mcg, Subcutaneous, Q Thu-1800, Claudia Desanctis, MD, 40 mcg at 06/16/20 3244 .  docusate (COLACE) 50 MG/5ML liquid 100 mg, 100 mg, Per Tube, Daily PRN, Chesley Mires, MD, 100 mg at 06/10/20 2301 .  feeding supplement (PROSource TF) liquid 45 mL, 45 mL, Per Tube, QID, Pahwani, Ravi, MD, 45 mL at 06/23/20 0851 .  feeding supplement (VITAL 1.5 CAL) liquid 1,000 mL, 1,000 mL, Per Tube, Q24H, Pahwani, Ravi, MD, Last Rate: 40 mL/hr at 06/23/20 1100, Infusion Verify at 06/23/20 1100 .  fentaNYL (SUBLIMAZE)  injection 12.5-25 mcg, 12.5-25 mcg, Intravenous, Q2H PRN, Wendee Beavers T, MD, 25 mcg at 06/23/20 0907 .  FLUoxetine (PROZAC) capsule 40 mg, 40 mg, Per Tube, Daily, Sood, Vineet, MD, 40 mg at 06/23/20 0851 .  heparin injection 5,000 Units, 5,000 Units, Subcutaneous, Q8H, Christy Gentles, RPH, 5,000 Units at 06/23/20 4585 .  insulin aspart (novoLOG) injection 0-9 Units, 0-9 Units, Subcutaneous, Q4H, Chesley Mires, MD, 1 Units at 06/23/20 [phone] .  MEDLINE mouth rinse, 15 mL, Mouth Rinse, q12n4p, Sood, Vineet, MD, 15 mL at 06/22/20 1814 .  metoCLOPramide (REGLAN) injection 10 mg, 10 mg, Intravenous, Q8H, Pahwani, Ravi, MD .  midodrine (PROAMATINE) tablet 10 mg, 10 mg, Per Tube, TID WC, Audria Nine, DO, 10 mg at 06/21/20 1119 .  multivitamin (RENA-VIT) tablet 1 tablet, 1 tablet, Per Tube, QHS, Mercy Riding, MD, 1 tablet at 06/22/20 2127 .  ondansetron (ZOFRAN) injection 4 mg, 4 mg, Intravenous, Q6H PRN, Darliss Cheney, MD, 4 mg at 06/23/20 0908 .  pantoprazole sodium (PROTONIX) 40 mg/20 mL oral suspension 40 mg, 40 mg, Per Tube, Q24H, Sood, Vineet, MD, 40 mg at 06/21/20 0930 .  polyethylene glycol (MIRALAX / GLYCOLAX) packet 17 g, 17 g, Per Tube, Daily PRN, Chesley Mires, MD .  Resource ThickenUp Clear, , Oral, PRN, Audria Nine, DO, Given at 06/16/20 0302 .  sodium chloride  flush (NS) 0.9 % injection 10-40 mL, 10-40 mL, Intracatheter, Q12H, Chesley Mires, MD, 10 mL at 06/23/20 0853 .  sodium chloride flush (NS) 0.9 % injection 10-40 mL, 10-40 mL, Intracatheter, PRN, Chesley Mires, MD   Patients Current Diet:     Diet Order                      DIET DYS 2 Room service appropriate? Yes with Assist; Fluid consistency: Thin  Diet effective now                      Precautions / Restrictions Precautions Precautions: Fall,Other (comment) Precaution Comments: cortrak Restrictions Weight Bearing Restrictions: No RLE Weight Bearing: Weight bearing as tolerated    Has the patient had 2 or more falls or a fall with injury in the past year? No   Prior Activity Level Limited Community (1-2x/wk): limited since ankle fx and COVID   Prior Functional Level Self Care: Did the patient need help bathing, dressing, using the toilet or eating? Needed some help   Indoor Mobility: Did the patient need assistance with walking from room to room (with or without device)? Needed some help   Stairs: Did the patient need assistance with internal or external stairs (with or without device)? Needed some help   Functional Cognition: Did the patient need help planning regular tasks such as shopping or remembering to take medications? Independent   Home Assistive Devices / Equipment Home Assistive Devices/Equipment: None Home Equipment: Walker - 2 wheels,Wheelchair - manual (reported on 04/2020 thay pt had tub bench and BSC)   Prior Device Use: Indicate devices/aids used by the patient prior to current illness, exacerbation or injury? Walker   Current Functional Level Cognition   Overall Cognitive Status: Impaired/Different from baseline Current Attention Level: Selective Orientation Level: Oriented X4 Following Commands: Follows one step commands consistently Safety/Judgement: Decreased awareness of safety,Decreased awareness of deficits General Comments: Aware of month  but does not know if it is the beginning or end of Febuary; very slow processing    Extremity Assessment (includes Sensation/Coordination)   Upper Extremity  Assessment: Generalized weakness  Lower Extremity Assessment: Defer to PT evaluation     ADLs   Overall ADL's : Needs assistance/impaired Eating/Feeding: Sitting Eating/Feeding Details (indicate cue type and reason): modified; thickened liquids; pt asking for water Grooming: Set up,Supervision/safety,Sitting Upper Body Bathing: Set up,Supervision/ safety,Sitting Lower Body Bathing: Moderate assistance,Sit to/from stand Upper Body Dressing : Minimal assistance,Sitting Lower Body Dressing: Moderate assistance,Sit to/from stand Toilet Transfer: Minimal Office manager Details (indicate cue type and reason): +2 simulated to recliner Toileting- Clothing Manipulation and Hygiene: Minimal assistance,Sit to/from stand,Sitting/lateral lean Functional mobility during ADLs: Minimal assistance,Rolling walker,Cueing for safety General ADL Comments: Pt limited by varying levels of arousal and decreased cognition; decreased strength and ability to care for self.     Mobility   Overal bed mobility: Needs Assistance Bed Mobility: Supine to Sit Rolling: Supervision Supine to sit: Min guard,HOB elevated Sit to supine: Min guard General bed mobility comments: Assist for safety and lines. Incr time to perform     Transfers   Overall transfer level: Needs assistance Equipment used: Rolling walker (2 wheeled) Transfers: Sit to/from Stand Sit to Stand: Min assist Stand pivot transfers: Min guard General transfer comment: Assist for balance. Verbal cues for hand placement.     Ambulation / Gait / Stairs / Wheelchair Mobility   Ambulation/Gait Ambulation/Gait assistance: Herbalist (Feet): 100 Feet Assistive device: Rolling walker (2 wheeled) Gait Pattern/deviations: Step-through pattern,Decreased stride  length,Trunk flexed,Wide base of support,Drifts right/left General Gait Details: Assist for balance and support. Pt with tendency to veer lt. Verbal cues for posture. Gait velocity: decr Gait velocity interpretation: 1.31 - 2.62 ft/sec, indicative of limited community ambulator     Posture / Balance Dynamic Sitting Balance Sitting balance - Comments: EOB without UE support Balance Overall balance assessment: Needs assistance Sitting-balance support: No upper extremity supported,Feet supported Sitting balance-Leahy Scale: Fair Sitting balance - Comments: EOB without UE support Standing balance support: Bilateral upper extremity supported Standing balance-Leahy Scale: Poor Standing balance comment: walker and min guard for static standing     Special needs/care consideration 2/18 43 inch 10 FR left nare Cortrak placed    Previous Home Environment  Living Arrangements: Spouse/significant other,Children  Lives With: Spouse Available Help at Discharge: Family,Available 24 hours/day Type of Home: House Home Layout: One level Home Access: Stairs to enter,Other (comment) Entrance Stairs-Rails: None Entrance Stairs-Number of Steps: 2 Bathroom Shower/Tub: Chiropodist: Standard Bathroom Accessibility: Yes How Accessible: Accessible via walker [place]: No Additional Comments: Information different from previous stay in Jan 2022   Discharge Living Setting Plans for Discharge Living Setting: Patient's home,Lives with (comment) (spouse) Type of Home at Discharge: House Discharge Home Layout: One level Discharge Home Access: Stairs to enter Entrance Stairs-Rails: None Entrance Stairs-Number of Steps: 2 Discharge Bathroom Shower/Tub: Tub/shower unit Discharge Bathroom Toilet: Standard Discharge Bathroom Accessibility: Yes How Accessible: Accessible via walker Does the patient have any problems obtaining your medications?: No   Social/Family/Support  Systems Patient Roles: Spouse Contact Information: Tommie Raymond, spouse Anticipated Caregiver: spouse Anticipated Caregiver's Contact Information: see above Caregiver Availability: 24/7 Discharge Plan Discussed with Primary Caregiver: Yes Is Caregiver In Agreement with Plan?: No Does Caregiver/Family have Issues with Lodging/Transportation while Pt is in Rehab?: Yes   Goals Patient/Family Goal for Rehab: Mod I to supervision with PT, OT, and SLP Expected length of stay: ELOS 10 -14 days Additional Information: no longer needs hemodialysis Pt/Family Agrees to Admission and willing to participate: Yes Program Orientation Provided & Reviewed  with Pt/Caregiver Including Roles  & Responsibilities: Yes   Decrease burden of Care through IP rehab admission: n/a   Possible need for SNF placement upon discharge: not anticipated   Patient Condition: I have reviewed medical records from [facility], spoken with  patient and spouse. I met with patient at the bedside for inpatient rehabilitation assessment.  Patient will benefit from ongoing PT, OT and SLP, can actively participate in 3 hours of therapy a day 5 days of the week, and can make measurable gains during the admission.  Patient will also benefit from the coordinated team approach during an Inpatient Acute Rehabilitation admission.  The patient will receive intensive therapy as well as Rehabilitation physician, nursing, social worker, and care management interventions.  Due to bladder management, bowel management, safety, skin/wound care, disease management, medication administration, pain management and patient education the patient requires 24 hour a day rehabilitation nursing.  The patient is currently min assist overall with mobility and basic ADLs.  Discharge setting and therapy post discharge at home with home health is anticipated.  Patient has agreed to participate in the Acute Inpatient Rehabilitation Program and will admit today.    Preadmission Screen Completed By:  Cleatrice Burke, 06/23/2020 12:12 PM ______________________________________________________________________   Discussed status with Dr. Dagoberto Ligas on  06/23/2020 at  1212 and received approval for admission today.   Admission Coordinator:  Cleatrice Burke, RN, time  8250 Date  06/23/2020    Assessment/Plan: Diagnosis: 1. Does the need for close, 24 hr/day Medical supervision in concert with the patient's rehab needs make it unreasonable for this patient to be served in a less intensive setting? Yes 2. Co-Morbidities requiring supervision/potential complications: PTSD- Recent R ankle fx- now WBAT; off dialysis/CRRT, dysphagia, severe depression- on Abilify; ABLA s/p 1 unit pRBCs 3. Due to bladder management, bowel management, safety, skin/wound care, disease management, medication administration, pain management and patient education, does the patient require 24 hr/day rehab nursing? Yes 4. Does the patient require coordinated care of a physician, rehab nurse, PT, OT, and SLP to address physical and functional deficits in the context of the above medical diagnosis(es)? Yes Addressing deficits in the following areas: balance, endurance, locomotion, strength, transferring, bowel/bladder control, bathing, dressing, feeding, grooming, toileting, cognition, speech, language and swallowing 5. Can the patient actively participate in an intensive therapy program of at least 3 hrs of therapy 5 days a week? Yes 6. The potential for patient to make measurable gains while on inpatient rehab is good 7. Anticipated functional outcomes upon discharge from inpatient rehab: modified independent and supervision PT, modified independent and supervision OT, supervision SLP 8. Estimated rehab length of stay to reach the above functional goals is: 10-14 days 9. Anticipated discharge destination: Home 10. Overall Rehab/Functional Prognosis: good     MD Signature:             Revision History                                  Note Details  Jan Fireman, MD File Time 06/23/2020 12:20 PM  Author Type Physician Status Signed  Last Editor Courtney Heys, MD Service Physical Medicine and Rehabilitation

## 2020-06-23 NOTE — Progress Notes (Signed)
Inpatient Rehabilitation  Patient information reviewed and entered into eRehab system by Sandi Towe M. Shell Yandow, M.A., CCC/SLP, PPS Coordinator.  Information including medical coding, functional ability and quality indicators will be reviewed and updated through discharge.    

## 2020-06-23 NOTE — Progress Notes (Signed)
Patient ID: Abby Potash, female   DOB: [birth year], 50 y.o.   MRN: [id number] S: Feels well, no complaints O:BP 138/85 (BP Location: Left Arm)   Pulse (!) 110   Temp 98.7 F (37.1 C) (Oral)   Resp 20   Ht 5\' 6"  (1.676 m)   Wt 81.1 kg   LMP 06/03/2018 (Approximate) Comment: neg hcg 05/10/20  SpO2 96%   BMI 28.86 kg/m   Intake/Output Summary (Last 24 hours) at 06/23/2020 1030 Last data filed at 06/22/2020 2358 Gross per 24 hour  Intake --  Output 200 ml  Net -200 ml   Intake/Output: I/O last 3 completed shifts: In: 15 [P.O.:120; I.V.:3; NG/GT:480] Out: 200 [Urine:200]  Intake/Output this shift:  No intake/output data recorded. Weight change: -5.3 kg Gen: NAD CVS: tachy Resp: CTA Abd: benign Ext: no edema  Recent Labs  Lab 06/17/20 1015 06/18/20 0152 06/19/20 0303 06/20/20 0147 06/21/20 0226 06/22/20 0248 06/23/20 0312  NA 130* 132* 134* 136 138 139 140  K 4.3 3.6 3.7 3.6 3.8 3.4* 3.4*  CL 93* 96* 97* 99 99 102 103  CO2 22 25 24 23 25 24 24   GLUCOSE 141* 196* 183* 194* 175* 154* 139*  BUN 64* 30* 49* 57* 23* 24* 20  CREATININE 4.34* 2.55* 3.60* 3.80* 2.20* 2.24* 1.81*  ALBUMIN 2.3* 2.2* 2.3* 2.3* 2.3* 2.5* 2.4*  CALCIUM 8.9 8.4* 8.7* 8.8* 8.8* 9.0 9.1  PHOS 4.1 3.5 5.0* 5.7* 4.0 4.6 4.7*   Liver Function Tests: Recent Labs  Lab 06/21/20 0226 06/22/20 0248 06/23/20 0312  ALBUMIN 2.3* 2.5* 2.4*   No results for input(s): LIPASE, AMYLASE in the last 168 hours. No results for input(s): AMMONIA in the last 168 hours. CBC: Recent Labs  Lab 06/17/20 1015 06/18/20 0201 06/19/20 0303 06/20/20 0147  WBC 10.3  --   --  7.3  HGB 9.1* 8.4* 8.7* 8.3*  HCT 27.7* 25.8* 26.9* 24.3*  MCV 92.0  --   --  93.5  PLT 295  --   --  369   Cardiac Enzymes: No results for input(s): CKTOTAL, CKMB, CKMBINDEX, TROPONINI in the last 168 hours. CBG: Recent Labs  Lab 06/22/20 1609 06/22/20 2017 06/22/20 2357 06/23/20 0403 06/23/20 0747  GLUCAP 105* 137* 133* 150* 129*     Iron Studies: No results for input(s): IRON, TIBC, TRANSFERRIN, FERRITIN in the last 72 hours. Studies/Results: No results found. . sodium chloride   Intravenous Once  . ARIPiprazole  5 mg Per Tube Daily  . chlorhexidine  15 mL Mouth Rinse BID  . Chlorhexidine Gluconate Cloth  6 each Topical Q0600  . darbepoetin (ARANESP) injection - NON-DIALYSIS  40 mcg Subcutaneous Q Thu-1800  . feeding supplement (PROSource TF)  45 mL Per Tube QID  . feeding supplement (VITAL 1.5 CAL)  1,000 mL Per Tube Q24H  . FLUoxetine  40 mg Per Tube Daily  . heparin injection (subcutaneous)  5,000 Units Subcutaneous Q8H  . insulin aspart  0-9 Units Subcutaneous Q4H  . mouth rinse  15 mL Mouth Rinse q12n4p  . metoCLOPramide (REGLAN) injection  10 mg Intravenous Q8H  . midodrine  10 mg Per Tube TID WC  . multivitamin  1 tablet Per Tube QHS  . pantoprazole sodium  40 mg Per Tube Q24H  . sodium chloride flush  10-40 mL Intracatheter Q12H    BMET    Component Value Date/Time   NA 140 06/23/2020 0312   K 3.4 (L) 06/23/2020 0312   CL 103  06/23/2020 0312   CO2 24 06/23/2020 0312   GLUCOSE 139 (H) 06/23/2020 0312   BUN 20 06/23/2020 0312   CREATININE 1.81 (H) 06/23/2020 0312   CALCIUM 9.1 06/23/2020 0312   GFRNONAA 34 (L) 06/23/2020 0312   GFRAA >60 05/05/2019 1207   CBC    Component Value Date/Time   WBC 7.3 06/20/2020 0147   RBC 2.60 (L) 06/20/2020 0147   HGB 8.3 (L) 06/20/2020 0147   HCT 24.3 (L) 06/20/2020 0147   PLT 369 06/20/2020 0147   MCV 93.5 06/20/2020 0147   MCH 31.9 06/20/2020 0147   MCHC 34.2 06/20/2020 0147   RDW 14.6 06/20/2020 0147   LYMPHSABS 1.1 05/12/2020 0042   MONOABS 0.6 05/12/2020 0042   EOSABS 0.3 05/12/2020 0042   BASOSABS 0.0 05/12/2020 0042     Assessment/Plan: 1. AKI, oliguric- presumably due to ischemic ATN following cardiac arrest. Started on CRRT 06/08/20 then stopped 2/21 and transitioned to IHD on 06/15/20. 1. Starting to have increased UOP and not having  complete collections due to incontinence. 2. BUN/Cr improving without HD 3. Will remove TDC as she no longer requires dialysis and is having renal recovery. 4. Need purewick for better I's/O's 5. Nothing further to add and will sign off for now.  Please call with questions. 6. She can have outpatient follow up with our office in a month after discharge if her kidney function does not return to normal by that time.  2. Asystolic cardiac arrest s/p CPR with ROSC 3. Acute hypoxic respiratory failure- improved 4. Shock/hypotension - possible aspiration pna. Now improved 5. Vascular access- s/p RIJ TDC placed 06/14/20. 6. Hematochezia- GI following. 7. Disposition- PT/OT working with patient. No CIR beds for now.    Donetta Potts, MD [employer] [phone]

## 2020-06-23 NOTE — Plan of Care (Signed)
?  Problem: Elimination: ?Goal: Will not experience complications related to bowel motility ?Outcome: Progressing ?  ?Problem: Pain Managment: ?Goal: General experience of comfort will improve ?Outcome: Progressing ?  ?Problem: Safety: ?Goal: Ability to remain free from injury will improve ?Outcome: Progressing ?  ?

## 2020-06-23 NOTE — Progress Notes (Signed)
Inpatient Rehabilitation Medication Review by a Pharmacist  A complete drug regimen review was completed for this patient to identify any potential clinically significant medication issues.  Clinically significant medication issues were identified:  No.  Check AMION for pharmacist assigned to patient if future medication questions/issues arise during this admission.  Pharmacist comments: Pt was on Ferrous sulfate 325 mg daily prior to admission  Time spent performing this drug regimen review (minutes):  10 min.   Blenda Nicely, RPh Clinical Pharmacist 06/23/2020 9:05 PM

## 2020-06-23 NOTE — Discharge Instructions (Signed)
Acute Kidney Injury, Adult  Acute kidney injury is a sudden worsening of kidney function. The kidneys are organs that have several jobs. They filter the blood to remove waste products and extra fluid. They also maintain a healthy balance of minerals and hormones in the body, which helps control blood pressure and keep bones strong. With this condition, your kidneys do not do their jobs as well as they should. This condition ranges from mild to severe. Over time, it may develop into long-lasting (chronic) kidney disease. Early detection and treatment may prevent acute kidney injury from developing into a chronic condition. What are the causes? Common causes of this condition include:  A problem with blood flow to the kidneys. This may be caused by: ? Low blood pressure (hypotension) or shock. ? Blood loss. ? Heart and blood vessel (cardiovascular) disease. ? Severe burns. ? Liver disease.  Direct damage to the kidneys. This may be caused by: ? Certain medicines. ? A kidney infection. ? Poisoning. ? Being around or in contact with toxic substances. ? A surgical wound. ? A hard, direct hit to the kidney area.  A sudden blockage of urine flow. This may be caused by: ? Cancer. ? Kidney stones. ? An enlarged prostate in males. What increases the risk? You are more likely to develop this condition if you:  Are older than age 65.  Are female.  Are hospitalized, especially if you are in critical condition.  Have certain conditions, such as: ? Chronic kidney disease. ? Diabetes. ? Coronary artery disease and heart failure. ? Pulmonary disease. ? Chronic liver disease. What are the signs or symptoms? Symptoms of this condition may not be obvious until the condition becomes severe. Symptoms of this condition can include:  Tiredness (lethargy) or difficulty staying awake.  Nausea or vomiting.  Swelling (edema) of the face, legs, ankles, or feet.  Problems with urination, such  as: ? Pain in the abdomen, or pain along the side of your stomach (flank). ? Producing little or no urine. ? Passing urine with a weak flow.  Muscle twitches and cramps, especially in the legs.  Confusion or trouble concentrating.  Loss of appetite.  Fever. How is this diagnosed? Your health care provider can diagnose this condition based on your symptoms, medical history, and a physical exam.  You may also have other tests, such as:  Blood tests.  Urine tests.  Imaging tests.  A test in which a sample of tissue is removed from the kidneys to be examined under a microscope (kidney biopsy). How is this treated? Treatment for this condition depends on the cause and how severe the condition is. In mild cases, treatment may not be needed. The kidneys may heal on their own. In more severe cases, treatment will involve:  Treating the cause of the kidney injury. This may involve changing any medicines you are taking or adjusting your dosage.  Fluids. You may need specialized IV fluids to balance your body's needs.  Having a catheter placed to drain urine and prevent blockages.  Preventing problems from occurring. This may mean avoiding certain medicines or procedures that can cause further injury to the kidneys. In some cases, treatment may also require:  A procedure to remove toxic wastes from the body (dialysis or continuous renal replacement therapy, CRRT).  Surgery. This may be done to repair a torn kidney or to remove the blockage from the urinary system. Follow these instructions at home: Medicines  Take over-the-counter and prescription medicines only as   told by your health care provider.  Do not take any new medicines without your health care provider's approval. Many medicines can worsen your kidney damage.  Do not take any vitamin and mineral supplements without your health care provider's approval. Many nutritional supplements can worsen your kidney  damage. Lifestyle  If your health care provider prescribed changes to your diet, follow them. You may need to decrease the amount of protein you eat.  Achieve and maintain a healthy weight. If you need help with this, ask your health care provider.  Start or continue an exercise plan. Try to exercise at least 30 minutes a day, 5 days a week.  Do not use any products that contain nicotine or tobacco, such as cigarettes, e-cigarettes, and chewing tobacco. If you need help quitting, ask your health care provider.   General instructions  Keep track of your blood pressure. Report changes in your blood pressure as told by your health care provider.  Stay up to date with your vaccines. Ask your health care provider which vaccines you need.  Keep all follow-up visits as told by your health care provider. This is important.   Where to find more information  American Association of Kidney Patients: www.aakp.org  National Kidney Foundation: www.kidney.org  American Kidney Fund: www.akfinc.org  Life Options Rehabilitation Program: ? www.lifeoptions.org ? www.kidneyschool.org Contact a health care provider if:  Your symptoms get worse.  You develop new symptoms. Get help right away if:  You develop symptoms of worsening kidney disease, which include: ? Headaches. ? Abnormally dark or light skin. ? Easy bruising. ? Frequent hiccups. ? Chest pain. ? Shortness of breath. ? End of menstruation in women. ? Seizures. ? Confusion or altered mental status. ? Abdominal or back pain. ? Itchiness.  You have a fever.  Your body is producing less urine.  You have pain or bleeding when you urinate. Summary  Acute kidney injury is a sudden worsening of kidney function.  Acute kidney injury can be caused by problems with blood flow to the kidneys, direct damage to the kidneys, and sudden blockage of urine flow.  Symptoms of this condition may not be obvious until it becomes severe.  Symptoms may include edema, lethargy, confusion, nausea or vomiting, and problems passing urine.  This condition can be diagnosed with blood tests, urine tests, and imaging tests. Sometimes a kidney biopsy is done to diagnose this condition.  Treatment for this condition often involves treating the underlying cause. It is treated with fluids, medicines, diet changes, dialysis, or surgery. This information is not intended to replace advice given to you by your health care provider. Make sure you discuss any questions you have with your health care provider. Document Revised: 02/17/2019 Document Reviewed: 02/17/2019 Elsevier Patient Education  2021 Elsevier Inc.  

## 2020-06-23 NOTE — Progress Notes (Signed)
Inpatient Rehabilitation Admissions Coordinator  I have CIR bed to admit patient to today. I met at bedside with therapy, patient and her spouse. Patient in agreement to admit today. I have alerted Dr. Doristine Bosworth, acute team and TOC. I will make the arrangements to admit today.  Danne Baxter, RN, MSN Rehab Admissions Coordinator [phone] 06/23/2020 12:05 PM

## 2020-06-23 NOTE — Plan of Care (Signed)
?  Problem: Clinical Measurements: ?Goal: Will remain free from infection ?Outcome: Progressing ?  ?

## 2020-06-23 NOTE — Progress Notes (Signed)
Physical Therapy Treatment Patient Details Name: [name] MRN: [id number] DOB: 04/04/1971 Today's Date: 06/23/2020    History of Present Illness 50 yo 2/16 after fell out of bed and family checked on her later and found unresponsive s/p arrest and CPR with intubation. ?overdose suspected. CRRT 2/16-2/20. Extubated 2/20. PMhx: HTN, HLD, anxiety/depression, PTSD, Covid + 1/18, Rt ankle fx s/p ORIF, endometriosis    PT Comments    Pt agreeable to therapy. Making good progress with mobility. Continue to recommend CIR for further rehab.    Follow Up Recommendations  CIR;Supervision/Assistance - 24 hour     Equipment Recommendations  None recommended by PT    Recommendations for Other Services       Precautions / Restrictions Precautions Precautions: Fall;Other (comment) Precaution Comments: cortrak    Mobility  Bed Mobility Overal bed mobility: Needs Assistance Bed Mobility: Supine to Sit     Supine to sit: Min guard;HOB elevated     General bed mobility comments: Assist for safety and lines. Incr time to perform    Transfers Overall transfer level: Needs assistance Equipment used: Rolling walker (2 wheeled) Transfers: Sit to/from Stand Sit to Stand: Min assist         General transfer comment: Assist for balance. Verbal cues for hand placement.  Ambulation/Gait Ambulation/Gait assistance: Min assist Gait Distance (Feet): 100 Feet Assistive device: Rolling walker (2 wheeled) Gait Pattern/deviations: Step-through pattern;Decreased stride length;Trunk flexed;Wide base of support;Drifts right/left Gait velocity: decr Gait velocity interpretation: 1.31 - 2.62 ft/sec, indicative of limited community ambulator General Gait Details: Assist for balance and support. Pt with tendency to veer lt. Verbal cues for posture.   Stairs             Wheelchair Mobility    Modified Rankin (Stroke Patients Only)       Balance Overall balance assessment: Needs  assistance Sitting-balance support: No upper extremity supported;Feet supported Sitting balance-Leahy Scale: Fair     Standing balance support: Bilateral upper extremity supported Standing balance-Leahy Scale: Poor Standing balance comment: walker and min guard for static standing                            Cognition Arousal/Alertness: Awake/alert Behavior During Therapy: Flat affect Overall Cognitive Status: Impaired/Different from baseline Area of Impairment: Attention;Memory;Following commands;Safety/judgement;Awareness;Problem solving                   Current Attention Level: Selective Memory: Decreased short-term memory Following Commands: Follows one step commands consistently Safety/Judgement: Decreased awareness of safety;Decreased awareness of deficits Awareness: Emergent Problem Solving: Slow processing;Decreased initiation;Requires verbal cues        Exercises Other Exercises Other Exercises: repeated sit to stands from chair with min assist x 5    General Comments        Pertinent Vitals/Pain Pain Assessment: No/denies pain    Home Living                      Prior Function            PT Goals (current goals can now be found in the care plan section) Acute Rehab PT Goals Patient Stated Goal: return home Progress towards PT goals: Progressing toward goals    Frequency    Min 3X/week      PT Plan Current plan remains appropriate    Co-evaluation  AM-PAC PT "6 Clicks" Mobility   Outcome Measure  Help needed turning from your back to your side while in a flat bed without using bedrails?: A Little Help needed moving from lying on your back to sitting on the side of a flat bed without using bedrails?: A Little Help needed moving to and from a bed to a chair (including a wheelchair)?: A Little Help needed standing up from a chair using your arms (e.g., wheelchair or bedside chair)?: A Little Help  needed to walk in hospital room?: A Little Help needed climbing 3-5 steps with a railing? : A Lot 6 Click Score: 17    End of Session Equipment Utilized During Treatment: Gait belt Activity Tolerance: Patient tolerated treatment well Patient left: in chair;with call bell/phone within reach;with chair alarm set;with family/visitor present Nurse Communication: Mobility status PT Visit Diagnosis: Other abnormalities of gait and mobility (R26.89);Difficulty in walking, not elsewhere classified (R26.2);Muscle weakness (generalized) (M62.81)     Time: 1040-1100 PT Time Calculation (min) (ACUTE ONLY): 20 min  Charges:  $Gait Training: 8-22 mins                     [place] Pager [phone] Office [place] 06/23/2020, 11:12 AM

## 2020-06-24 ENCOUNTER — Other Ambulatory Visit: Payer: Self-pay

## 2020-06-24 DIAGNOSIS — G9341 Metabolic encephalopathy: Principal | ICD-10-CM

## 2020-06-24 LAB — COMPREHENSIVE METABOLIC PANEL
ALT: 26 U/L (ref 0–44)
AST: 22 U/L (ref 15–41)
Albumin: 2.7 g/dL — ABNORMAL LOW (ref 3.5–5.0)
Alkaline Phosphatase: 92 U/L (ref 38–126)
Anion gap: 13 (ref 5–15)
BUN: 17 mg/dL (ref 6–20)
CO2: 22 mmol/L (ref 22–32)
Calcium: 8.9 mg/dL (ref 8.9–10.3)
Chloride: 102 mmol/L (ref 98–111)
Creatinine, Ser: 1.47 mg/dL — ABNORMAL HIGH (ref 0.44–1.00)
GFR, Estimated: 43 mL/min — ABNORMAL LOW (ref >60)
Glucose, Bld: 162 mg/dL — ABNORMAL HIGH (ref 70–99)
Potassium: 3.3 mmol/L — ABNORMAL LOW (ref 3.5–5.1)
Sodium: 137 mmol/L (ref 135–145)
Total Bilirubin: 0.4 mg/dL (ref 0.3–1.2)
Total Protein: 6.3 g/dL — ABNORMAL LOW (ref 6.5–8.1)

## 2020-06-24 LAB — CBC WITH DIFFERENTIAL/PLATELET
Abs Immature Granulocytes: 0.02 10*3/uL (ref 0.00–0.07)
Basophils Absolute: 0.1 10*3/uL (ref 0.0–0.1)
Basophils Relative: 1 %
Eosinophils Absolute: 0.2 10*3/uL (ref 0.0–0.5)
Eosinophils Relative: 4 %
HCT: 26.7 % — ABNORMAL LOW (ref 36.0–46.0)
Hemoglobin: 8.6 g/dL — ABNORMAL LOW (ref 12.0–15.0)
Immature Granulocytes: 0 %
Lymphocytes Relative: 19 %
Lymphs Abs: 1.1 10*3/uL (ref 0.7–4.0)
MCH: 31 pg (ref 26.0–34.0)
MCHC: 32.2 g/dL (ref 30.0–36.0)
MCV: 96.4 fL (ref 80.0–100.0)
Monocytes Absolute: 0.5 10*3/uL (ref 0.1–1.0)
Monocytes Relative: 9 %
Neutro Abs: 3.8 10*3/uL (ref 1.7–7.7)
Neutrophils Relative %: 67 %
Platelets: 268 10*3/uL (ref 150–400)
RBC: 2.77 MIL/uL — ABNORMAL LOW (ref 3.87–5.11)
RDW: 14.3 % (ref 11.5–15.5)
WBC: 5.8 10*3/uL (ref 4.0–10.5)
nRBC: 0 % (ref 0.0–0.2)

## 2020-06-24 LAB — GLUCOSE, CAPILLARY
Glucose-Capillary: 102 mg/dL — ABNORMAL HIGH (ref 70–99)
Glucose-Capillary: 112 mg/dL — ABNORMAL HIGH (ref 70–99)
Glucose-Capillary: 131 mg/dL — ABNORMAL HIGH (ref 70–99)
Glucose-Capillary: 139 mg/dL — ABNORMAL HIGH (ref 70–99)
Glucose-Capillary: 145 mg/dL — ABNORMAL HIGH (ref 70–99)
Glucose-Capillary: 98 mg/dL (ref 70–99)

## 2020-06-24 MED ORDER — FREE WATER
50.0000 mL | Freq: Four times a day (QID) | Status: DC
  Administered 2020-06-24 – 2020-06-30 (×22): 50 mL

## 2020-06-24 MED ORDER — OSMOLITE 1.5 CAL PO LIQD
880.0000 mL | ORAL | Status: DC
  Administered 2020-06-25 – 2020-06-26 (×2): 880 mL
  Filled 2020-06-24 (×3): qty 948

## 2020-06-24 MED ORDER — ZOLPIDEM TARTRATE 5 MG PO TABS
5.0000 mg | ORAL_TABLET | Freq: Every evening | ORAL | Status: DC | PRN
  Administered 2020-06-24: 5 mg via ORAL
  Filled 2020-06-24: qty 1

## 2020-06-24 MED ORDER — ALUM & MAG HYDROXIDE-SIMETH 200-200-20 MG/5ML PO SUSP
30.0000 mL | ORAL | Status: DC | PRN
  Administered 2020-06-25 – 2020-06-27 (×4): 30 mL via ORAL
  Filled 2020-06-24 (×5): qty 30

## 2020-06-24 MED ORDER — OSMOLITE 1.5 CAL PO LIQD
880.0000 mL | ORAL | Status: DC
  Administered 2020-06-24: 880 mL
  Filled 2020-06-24: qty 948

## 2020-06-24 MED ORDER — ONDANSETRON HCL 4 MG/2ML IJ SOLN
4.0000 mg | Freq: Four times a day (QID) | INTRAMUSCULAR | Status: DC | PRN
  Administered 2020-06-24 – 2020-06-27 (×10): 4 mg via INTRAVENOUS
  Filled 2020-06-24 (×10): qty 2

## 2020-06-24 MED ORDER — POTASSIUM CHLORIDE CRYS ER 20 MEQ PO TBCR
40.0000 meq | EXTENDED_RELEASE_TABLET | Freq: Once | ORAL | Status: AC
  Administered 2020-06-24: 40 meq via ORAL
  Filled 2020-06-24: qty 2

## 2020-06-24 MED ORDER — ONDANSETRON HCL 4 MG PO TABS
4.0000 mg | ORAL_TABLET | Freq: Four times a day (QID) | ORAL | Status: DC | PRN
  Administered 2020-06-24: 4 mg via ORAL
  Filled 2020-06-24 (×2): qty 1

## 2020-06-24 NOTE — Progress Notes (Signed)
[facility] Individual Statement of Services  Patient Name:  [name]  Date:  06/24/2020  Welcome to the [facility].  Our goal is to provide you with an individualized program based on your diagnosis and situation, designed to meet your specific needs.  With this comprehensive rehabilitation program, you will be expected to participate in at least 3 hours of rehabilitation therapies Monday-Friday, with modified therapy programming on the weekends.  Your rehabilitation program will include the following services:  Physical Therapy (PT), Occupational Therapy (OT), Speech Therapy (ST), 24 hour per day rehabilitation nursing, Neuropsychology, Care Coordinator, Rehabilitation Medicine, Nutrition Services and Pharmacy Services  Weekly team conferences will be held on Wednesday to discuss your progress.  Your Inpatient Rehabilitation Care Coordinator will talk with you frequently to get your input and to update you on team discussions.  Team conferences with you and your family in attendance may also be held.  Expected length of stay: 12-14 DAYS  Overall anticipated outcome: SUPERVISION WITH CUES  Depending on your progress and recovery, your program may change. Your Inpatient Rehabilitation Care Coordinator will coordinate services and will keep you informed of any changes. Your Inpatient Rehabilitation Care Coordinator's name and contact numbers are listed  below.  The following services may also be recommended but are not provided by the [facility]:    [place] will be made to provide these services after discharge if needed.  Arrangements include referral to agencies that provide these services.  Your insurance has been verified to be:  Medicare A & B Your primary doctor is:  Judithe Modest  Pertinent information will be shared with your doctor and  your insurance company.  Inpatient Rehabilitation Care Coordinator:  Ovidio Kin, [facility] or Emilia Beck  Information discussed with and copy given to patient by: Elease Hashimoto, 06/24/2020, 10:56 AM

## 2020-06-24 NOTE — Evaluation (Signed)
Physical Therapy Assessment and Plan  Patient Details  Name: [name] MRN: [id number] Date of Birth: 1971-03-23  PT Diagnosis: Abnormality of gait, Cognitive deficits, Difficulty walking, Impaired cognition, Muscle weakness and Pain in R ankle Rehab Potential: Good ELOS: 2 weeks   Today's Date: 06/24/2020 PT Individual Time: 1000-1058 PT Individual Time Calculation (min): 58 min    Hospital Problem: Active Problems:   Acute metabolic encephalopathy   Past Medical History:  Past Medical History:  Diagnosis Date  . Anxiety   . Arthritis    "joints ache all over" (10/15/2014)  . Barrett's esophagus   . Bulging lumbar disc   . Chronic lower back pain   . DDD (degenerative disc disease), cervical   . Depression   . Drug-seeking behavior   . Headache    "weekly" (10/15/2014)  . Hyperlipemia   . Hypertension   . PTSD (post-traumatic stress disorder)   . Skin cancer    "had them cut off my arms; don't know what kind"   Past Surgical History:  Past Surgical History:  Procedure Laterality Date  . ABLATION ON ENDOMETRIOSIS  2008  . BIOPSY  12/27/2018   Procedure: BIOPSY;  Surgeon: Thornton Park, MD;  Location: [facility];  Service: Gastroenterology;;  . ESOPHAGOGASTRODUODENOSCOPY (EGD) WITH PROPOFOL N/A 12/27/2018   Procedure: ESOPHAGOGASTRODUODENOSCOPY (EGD) WITH PROPOFOL;  Surgeon: Thornton Park, MD;  Location: [facility];  Service: Gastroenterology;  Laterality: N/A;  . HEMORRHOID SURGERY  ~ 2002  . IR FLUORO GUIDE CV LINE RIGHT  06/14/2020  . IR REMOVAL TUN CV CATH W/O FL  06/23/2020  . IR US GUIDE VASC ACCESS RIGHT  06/14/2020  . ORIF ANKLE FRACTURE Right 03/28/2020   Procedure: OPEN REDUCTION INTERNAL FIXATION (ORIF) RIGHT BIMALLEOLAR ANKLE FRACTURE;  Surgeon: Marchia Bond, MD;  Location: [facility];  Service: Orthopedics;  Laterality: Right;    Assessment & Plan Clinical Impression: Patient is a 50 year old right-handed female with history of  depression, opiate use, PTSDwith reported prior suicide attempts maintained on Prozac, Lamictal as well as Risperdal,, hypertension, Covid +1/18, endometriosis, ORIF right ankle 03/28/2020 and is weightbearing as toleratedand has been rather sedentary since her fracture.Per chart review patient lives with spouse and 2 adult aged children. 1 level home 2 steps to entry. Presented 06/08/2020 after reportedly falling out of the bed at some point during the night and family let her sleep on the floor when they later checked on her she was unresponsive. First responders found her to be pulseless and asystolic. She was intubated with a King airway and received CPR x3 minutes and epi x1 with ROSC. Immediately post ROSC her EKG was concerning for STEMI but subsequent EKG were improved. Cranial CT scan negative. Chemistries revealed potassium 6.5 chloride 89 glucose 180 BUN 214 creatinine 24.91 from baseline 0.83, WBC 16,200 hemoglobin 10.6, troponin 70, urine drug screen positive amphetamines, alcohol negative, lactic acid 4.4, CK 5564, magnesium 2.7hemoglobin A1c 6.1. Renal ultrasound unremarkable. Echocardiogram with ejection fraction of 55 to 60% no wall motion abnormalities. She remained intubated 2/16-2/20. Started on CRRT and transition to IHD on 06/15/2020 followed by renal services with latest renal function much improved creatinine 2.24-1.81and her hemodialysis is currently on hold as she is showing renal recovery. Chest x-ray suspicious for aspiration pneumonia completing course of Rocephin. Gastroenterology consulted 06/16/2020 for nonspecific lower abdominal pain and some bright red blood per rectum and hemoglobin dipping to 7.1 overnight day.. It was noted patient with extensive GI work-up in 2020 for IDA  and cyclic vomiting to include EGD showing non-HPyloric gastritis, colonoscopy benign rectal erythema and VCE 1 small nonbleeding distal AVM. She was transfused 1 unit packed red blood cells  gastroenterology services felt no further work-up was needed with close monitoring of hemoglobin and hematocrit. Her hemoglobin has stabilized 8.3 and she was cleared to begin subcutaneous heparin for DVT prophylaxis. Her cardiac status remained stable followed by Dr. Gwenlyn Found of cardiology services. She was maintained on midodrine for blood pressure. Psychiatry consulted for history of depression PTSD with prior suicide attempts and currently remains on Prozac as prior to admission with the addition of Abilify. She is tolerating a dysphagia #2 thin liquid dietand currently receiving nasogastric tube feeds for nutritional support. Therapy evaluations completed due to patient decreased functional ability was admitted for a comprehensive rehab program. Patient transferred to [facility] on 06/23/2020 .   Patient currently requires min with mobility secondary to muscle weakness, decreased cardiorespiratoy endurance, unbalanced muscle activation, motor apraxia, decreased coordination and decreased motor planning, decreased initiation, decreased attention, decreased awareness, decreased problem solving, decreased safety awareness and delayed processing and decreased sitting balance, decreased standing balance, decreased postural control and decreased balance strategies.  Prior to hospitalization, patient was independent  with mobility and lived with Tawni Pummel (2 adult sons (in their 47s)) in a House home.  Home access is 4Stairs to enter.  Patient will benefit from skilled PT intervention to maximize safe functional mobility, minimize fall risk and decrease caregiver burden for planned discharge home with 24 hour supervision.  Anticipate patient will benefit from follow up [place] at discharge.  PT - End of Session Activity Tolerance: Tolerates 30+ min activity with multiple rests Endurance Deficit: Yes Endurance Deficit Description: Brief seated rest breaks needed for recovery during simple functional mobility tasks PT  Assessment Rehab Potential (ACUTE/IP ONLY): Good PT Barriers to Discharge: Home environment access/layout;Decreased caregiver support;Insurance for SNF coverage;Weight;Nutrition means;Behavior PT Patient demonstrates impairments in the following area(s): Balance;Behavior;Endurance;Motor;Nutrition;Pain;Perception;Safety;Sensory PT Transfers Functional Problem(s): Bed Mobility;Bed to Chair;Car PT Locomotion Functional Problem(s): Ambulation;Stairs PT Plan PT Intensity: Minimum of 1-2 x/day ,45 to 90 minutes PT Frequency: 5 out of 7 days PT Duration Estimated Length of Stay: 2 weeks PT Treatment/Interventions: Ambulation/gait training;Discharge planning;Functional mobility training;Psychosocial support;Therapeutic Activities;Visual/perceptual remediation/compensation;Wheelchair propulsion/positioning;Therapeutic Exercise;Skin care/wound management;Disease management/prevention;Balance/vestibular training;Neuromuscular re-education;UE/LE Strength taining/ROM;Splinting/orthotics;Pain management;DME/adaptive equipment instruction;Cognitive remediation/compensation;Community reintegration;Functional electrical stimulation;Patient/family education;Stair training;UE/LE Coordination activities PT Transfers Anticipated Outcome(s): supervision with LRAD PT Locomotion Anticipated Outcome(s): supervision with LRAD PT Recommendation Recommendations for Other Services: Neuropsych consult Follow Up Recommendations: Home health PT Patient destination: Home Equipment Recommended: To be determined Equipment Details: Pt owns a w/c and RW (per husband)  PT Evaluation Precautions/Restrictions Precautions Precautions: Fall Precaution Comments: cortrak Restrictions Weight Bearing Restrictions: No RLE Weight Bearing: Weight bearing as tolerated General Chart Reviewed: Yes Additional Pertinent History: history of depression, opiate use, PTSD with reported prior suicide attempts maintained on Prozac, Lamictal as  well as Risperdal,, hypertension, Covid +1/18, endometriosis, ORIF right ankle 03/28/2020 and is weightbearing as tolerated and has been rather sedentary since her fracture. Family/Caregiver Present: Yes (husband) Vital Signs Pain Pain Assessment Pain Scale: 0-10 Pain Score: 0-No pain Home Living/Prior Functioning Home Living Living Arrangements: Spouse/significant other;Children Available Help at Discharge: Family;Available 24 hours/day Type of Home: House Home Access: Stairs to enter [employer] of Steps: 4 Entrance Stairs-Rails: None Home Layout: One level  Lives With: Spouse;Son (2 adult sons (in their 54s)) Prior Function Level of Independence: Independent with gait;Independent with transfers  Able to Take Stairs?: Yes Driving:  No Leisure: Hobbies-yes (Comment) (shopping) Comments: per husband, patient was indep PTA but limited lifestyle - does not drive and stays at home mostly Vision/Perception  Perception Perception: Within Functional Limits Praxis Praxis: Impaired Praxis Impairment Details: Ideomotor;Ideation;Initiation Praxis-Other Comments: delayed - will need further assessment in functional context  Cognition Overall Cognitive Status: Impaired/Different from baseline Arousal/Alertness: Awake/alert Orientation Level: Oriented X4 Attention: Focused;Sustained Focused Attention: Impaired Focused Attention Impairment: Verbal basic;Functional basic Sustained Attention: Impaired Sustained Attention Impairment: Verbal basic;Functional basic Problem Solving: Impaired Problem Solving Impairment: Verbal basic;Functional basic Safety/Judgment: Impaired Sensation Sensation Light Touch: Appears Intact Hot/Cold: Not tested Proprioception: Appears Intact Stereognosis: Not tested Coordination Gross Motor Movements are Fluid and Coordinated: No Fine Motor Movements are Fluid and Coordinated: No Coordination and Movement Description: very slowed and delayed -  effortful with movement patterns Heel Shin Test: delayed - limited by cog and processing - no dyscoordination noted Motor  Motor Motor: Abnormal postural alignment and control;Other (comment) Motor - Skilled Clinical Observations: Blocked movement patterns - rigidity present with delayed motor initiation  Trunk/Postural Assessment  Cervical Assessment Cervical Assessment: Exceptions to [facility] (forward head carriage) Thoracic Assessment Thoracic Assessment: Exceptions to [facility] (rounded shoulders and kyphoic posturing) Lumbar Assessment Lumbar Assessment: Exceptions to [facility] (posterior pelvic tilt) Postural Control Postural Control: Deficits on evaluation Righting Reactions: delayed and inadequate Protective Responses: delayed and inadequate  Balance Balance Balance Assessed: Yes Static Sitting Balance Static Sitting - Balance Support: Feet supported;No upper extremity supported Static Sitting - Level of Assistance: 5: Stand by assistance Dynamic Sitting Balance Dynamic Sitting - Balance Support: During functional activity;Feet unsupported Dynamic Sitting - Level of Assistance: 4: Min Insurance risk surveyor Standing - Balance Support: No upper extremity supported Static Standing - Level of Assistance: 4: Min assist Dynamic Standing Balance Dynamic Standing - Balance Support: During functional activity;Bilateral upper extremity supported Dynamic Standing - Level of Assistance: 4: Min assist Extremity Assessment      RLE Assessment RLE Assessment: Exceptions to [facility] General Strength Comments: Grossly 4-/5 (ankle DF/PF 3+/5) LLE Assessment LLE Assessment: Exceptions to [facility] General Strength Comments: Grossly 4-/5  Care Tool Care Tool Bed Mobility Roll left and right activity   Roll left and right assist level: Minimal Assistance - Patient > 75%    Sit to lying activity   Sit to lying assist level: Minimal Assistance - Patient > 75%    Lying to sitting edge of bed  activity   Lying to sitting edge of bed assist level: Minimal Assistance - Patient > 75%     Care Tool Transfers Sit to stand transfer   Sit to stand assist level: Minimal Assistance - Patient > 75%    Chair/bed transfer   Chair/bed transfer assist level: Minimal Assistance - Patient > 75%     Physiological scientist transfer assist level: Minimal Assistance - Patient > 75%      Care Tool Locomotion Ambulation   Assist level: Minimal Assistance - Patient > 75% Assistive device: Walker-rolling Max distance: 50'  Walk 10 feet activity   Assist level: Minimal Assistance - Patient > 75% Assistive device: Walker-rolling   Walk 50 feet with 2 turns activity   Assist level: Minimal Assistance - Patient > 75% Assistive device: Walker-rolling  Walk 150 feet activity Walk 150 feet activity did not occur: Safety/medical concerns      Walk 10 feet on uneven surfaces activity Walk 10 feet on uneven surfaces activity did  not occur: Safety/medical concerns      Stairs Stair activity did not occur: Safety/medical concerns        Walk up/down 1 step activity Walk up/down 1 step or curb (drop down) activity did not occur: Safety/medical concerns     Walk up/down 4 steps activity did not occuR: Safety/medical concerns  Walk up/down 4 steps activity      Walk up/down 12 steps activity Walk up/down 12 steps activity did not occur: Safety/medical concerns      Pick up small objects from floor Pick up small object from the floor (from standing position) activity did not occur: Safety/medical concerns      Wheelchair Will patient use wheelchair at discharge?: No          Wheel 50 feet with 2 turns activity      Wheel 150 feet activity        Refer to Care Plan for Long Term Goals  SHORT TERM GOAL WEEK 1 PT Short Term Goal 1 (Week 1): Pt will perform bed mobility with CGA and no bed features PT Short Term Goal 2 (Week 1): Pt will perform bed<>chair  transfers with CGA and LRAD PT Short Term Goal 3 (Week 1): Pt will ambulate 154f with CGA and LRAD PT Short Term Goal 4 (Week 1): Pt will initiate stair training  Recommendations for other services: Neuropsych  Skilled Therapeutic Intervention Mobility Transfers Transfers: Sit to Stand;Stand Pivot Transfers;Stand to Sit Sit to Stand: Minimal Assistance - Patient > 75% Stand to Sit: Minimal Assistance - Patient > 75% Stand Pivot Transfers: Minimal Assistance - Patient > 75% Stand Pivot Transfer Details: Tactile cues for sequencing;Tactile cues for initiation;Tactile cues for placement;Tactile cues for weight beaing;Verbal cues for technique;Verbal cues for sequencing;Verbal cues for safe use of DME/AE;Verbal cues for gait pattern;Manual facilitation for weight shifting;Manual facilitation for placement;Verbal cues for precautions/safety;Visual cues/gestures for precautions/safety;Tactile cues for weight shifting;Visual cues for safe use of DME/AE;Visual cues/gestures for sequencing;Tactile cues for posture Transfer (Assistive device): None Locomotion  Gait Ambulation: Yes Gait Assistance: Minimal Assistance - Patient > 75% Gait Distance (Feet): 50 Feet Assistive device: Rolling walker Gait Assistance Details: Tactile cues for initiation;Tactile cues for placement;Tactile cues for sequencing;Tactile cues for weight shifting;Verbal cues for sequencing;Verbal cues for technique;Verbal cues for precautions/safety;Manual facilitation for placement;Manual facilitation for weight shifting;Verbal cues for safe use of DME/AE;Verbal cues for gait pattern Gait Gait: Yes Gait Pattern: Impaired Gait Pattern: Step-through pattern;Decreased step length - left;Decreased stance time - right;Decreased hip/knee flexion - left;Decreased hip/knee flexion - right;Decreased dorsiflexion - right;Decreased weight shift to right;Poor foot clearance - right;Trunk flexed Gait velocity: decreased Stairs / Additional  Locomotion Stairs: No Wheelchair Mobility Wheelchair Mobility: No  Skilled Intervention: Pt received sitting upright in w/c, husband at bedside, pt agreeable to therapy. Pt reports no pain. Very flat affect during session with minimal verbalizations but able to produce when cued. Per husband, patient at baseline is very intelligent and will carry on conversations. Pt able to follow simple 3-step command but delayed initiation and processing noted. PLOF and social factors gathered from husband for time management. Per husband, patient suffered depressions after her ankle injury in November of 2021 and did not receive any therapy once she was discharged from the hospital.   W/c transport to dayroom gym for time management. Performed car transfer with minA for steadying during transfer, able to manage her legs by herself once seated in the car. W/c transport to main rehab gym. Stand<>pivot transfer with min/modA  and no AD from w/c to mat table - noted abrupt sitting and poor safety awareness with approach as she attempted to sit prematurely. Required minA for scooting backwards on mat table to reposition hips but once seated she was able to sit with close supervision. Sit<>stand with minA to RW from lowered mat table height - gait x3f + 557fwith minA and RW. Gait deficits included decreased R weight shift, impaired RW management with poor hand placement, running into objects on her L side with delayed and limited corrections, decreased gait speed, step-to gait pattern. Cues throughout for normalizing gait pattern, safety awareness. Pt returned to her room in w/c for time management and she remained seated with husband at bedside. Patient with safety belt alarm on and needs within reach.  Instructed pt in results of PT evaluation as detailed above, PT POC, rehab potential, rehab goals, and discharge recommendations. Additionally discussed CIR's policies regarding fall safety and use of chair alarm and/or  quick release belt. Pt verbalized understanding and in agreement. Will update pt's family members as they become available.   Discharge Criteria: Patient will be discharged from PT if patient refuses treatment 3 consecutive times without medical reason, if treatment goals not met, if there is a change in medical status, if patient makes no progress towards goals or if patient is discharged from hospital.  The above assessment, treatment plan, treatment alternatives and goals were discussed and mutually agreed upon: by patient and by family  ChAlger SimonsT, DPT 06/24/2020, 12:02 PM

## 2020-06-24 NOTE — Progress Notes (Signed)
Initial Nutrition Assessment  DOCUMENTATION CODES:   Not applicable  INTERVENTION:   Transition to nocturnal tube feeds via Cortrak: - Osmolite 1.5 @ 55 ml/hr to run over 16 hours from 1600 to 0800 (total of 880 ml) - Free water flushes of 50 ml q 6 hours to maintain tube patency  Nocturnal tube feeding regimen provides 1320 kcal, 55 grams of protein, and 671 ml of H2O (meets 73% of kcal needs and 61% of protein needs).  Total free water with flushes: 1080 ml  - Magic Cup TID with meals, each supplement provides 290 kcal and 9 grams of protein  - Vital Cuisine Shake TID, each supplement provides 520 kcal and 22 grams of protein  - Continue renal MVI daily  NUTRITION DIAGNOSIS:   Inadequate oral intake related to nausea,poor appetite as evidenced by meal completion < 25%.  GOAL:   Patient will meet greater than or equal to 90% of their needs  MONITOR:   PO intake,Supplement acceptance,Labs,Weight trends,TF tolerance  REASON FOR ASSESSMENT:   Malnutrition Screening Tool,Consult Enteral/tube feeding initiation and management  ASSESSMENT:   50 year old female with PMH of depression, opiate use, PTSD with reported prior suicide attempts, HTN, COVID positive on 05/10/20, endometriosis, ORIF right ankle 03/28/20. Presented 06/08/20 after cardiac arrest. Pt was intubated 2/16-2/20 and started on CRRT then transitioned to iHD on 06/15/20. Hemodialysis currently on hold due to signs of renal recovery. Pt is on a dysphagia 2 diet with thin liquids but PO intake is poor. Cortrak in place for nutrition support. Admitted to CIR on 3/03.   Pt with gastric Cortrak in place. Continuous tube feeds currently ordered. Discussed pt with RN. Pt continues to have minimal PO intake with ~10% of lunch meal tray consumed. Will transition pt to standard formula (Osmolite 1.5) and order tube feeds to infuse over 16 hours from 4:00 pm to 8:00 am so that pt can be off of tube feeds during the day. Nocturnal  tube feeds may also help stimulate pt's appetite during the day and promote increased PO intake.  Noted pt refusing ProSource TF. Per review of notes, pt complains of nausea after medications are administered via tube. RD will d/c ProSource TF due to pt's continued refusal.  Pt unable to provide any meaningful information at time of RD visit. No family available. Reviewed RD notes from acute admission. Tube feeds held multiple times during acute admission due to pt complaining of nausea. Noted Reglan ordered TID. Pt was not drinking Ensure supplements during acute admission. RD will order Magic Cups and Hormel Shakes with meals to see if pt will consume these supplements.  Reviewed weight history in chart. Pt with progressive weight loss since December 2021. If weights are accurate, pt has lost a total of 14.3 kg since 03/28/20. This is a 14.8% weight loss in 3 months which is severe and significant for timeframe. Pt is at risk for malnutrition.  Current TF: Vital 1.5 @ 40 ml/hr, ProSource TF 45 ml QID  Meal Completion: 0% x 2 meals  Medications reviewed and include: prozac, reglan 10 mg TID, rena-vit, protonix  Labs reviewed: potassium 3.3, creatinine 1.47, hemoglobin 8.6 CBG's: 132-146 x 24 hours  NUTRITION - FOCUSED PHYSICAL EXAM:  Flowsheet Row Most Recent Value  Orbital Region No depletion  Upper Arm Region No depletion  Thoracic and Lumbar Region No depletion  Buccal Region No depletion  Temple Region Mild depletion  Clavicle Bone Region No depletion  Clavicle and Acromion Bone Region No depletion  Scapular Bone Region No depletion  Dorsal Hand No depletion  Patellar Region No depletion  Anterior Thigh Region No depletion  Posterior Calf Region No depletion  Edema (RD Assessment) Mild  Hair Reviewed  Eyes Reviewed  Mouth Reviewed  Skin Reviewed  Nails Reviewed       Diet Order:   Diet Order            DIET DYS 3 Room service appropriate? Yes; Fluid consistency: Thin   Diet effective now                 EDUCATION NEEDS:   Not appropriate for education at this time  Skin:  Skin Assessment: Reviewed RN Assessment  Last BM:  06/23/20 small type 2  Height:   Ht Readings from Last 1 Encounters:  06/23/20 5\' 6"  (1.676 m)    Weight:   Wt Readings from Last 1 Encounters:  06/24/20 82 kg    BMI:  Body mass index is 29.18 kg/m.  Estimated Nutritional Needs:   Kcal:  1800-2000  Protein:  90-110 grams  Fluid:  1.8-2.0 L    Gustavus Bryant, MS, RD, LDN Inpatient Clinical Dietitian Please see [facility] for contact information.

## 2020-06-24 NOTE — Progress Notes (Signed)
Pt called stating "i'm in a mess, I threw up" went to see pt she had thrown up clear liquid. Stated "I don't feel good". Pt was nauseated this evening, given zofran 4mg  PO @ 1700. New tube feed started this evening as well, tube feed stopped once pt became nauseated, and threw up shortly after.  Oncoming nurse made aware.   Dayna Ramus

## 2020-06-24 NOTE — Progress Notes (Signed)
PROGRESS NOTE   Subjective/Complaints: No complaints this morning Sitting in chair Cortrak in place Negative abdominal XR  ROS: unable to obtain given cognitive deficits   Objective:   DG Abd 1 View  Result Date: 06/23/2020 CLINICAL DATA:  Ileus, abdominal pain. EXAM: ABDOMEN - 1 VIEW COMPARISON:  June 16, 2020. FINDINGS: The bowel gas pattern is normal. No radio-opaque calculi or other significant radiographic abnormality are seen. IMPRESSION: Negative. Electronically Signed   By: Marijo Conception M.D.   On: 06/23/2020 12:51   IR Removal Tun Cv Cath W/O FL  Result Date: 06/24/2020 INDICATION: Patient with acute renal failure, status post tunneled dialysis catheter placement 06/14/2020. She has experienced renal recovery and is no longer in need of the catheter. Request is made for removal. EXAM: REMOVAL TUNNELED CENTRAL VENOUS CATHETER MEDICATIONS: None ANESTHESIA/SEDATION: None FLUOROSCOPY TIME:  None COMPLICATIONS: None immediate. PROCEDURE: Informed written consent was obtained from the patient after a thorough discussion of the procedural risks, benefits and alternatives. All questions were addressed. Maximal Sterile Barrier Technique was utilized including caps, mask, sterile gowns, sterile gloves, sterile drape, hand hygiene and skin antiseptic. A timeout was performed prior to the initiation of the procedure. The patient's right chest and catheter was prepped and draped in a normal sterile fashion. Heparin was removed from both ports of catheter. 1% lidocaine was used for local anesthesia. Using gentle blunt dissection the cuff of the catheter was exposed and the catheter was removed in it's entirety. Pressure was held till hemostasis was obtained. A sterile dressing was applied. The patient tolerated the procedure well with no immediate complications. IMPRESSION: Successful catheter removal as described above. Read by: Brynda Greathouse PA-C Electronically Signed   By: Ruthann Cancer MD   On: 06/24/2020 09:15   Recent Labs    06/23/20 1641 06/24/20 0801  WBC 6.5 5.8  HGB 9.1* 8.6*  HCT 28.9* 26.7*  PLT 302 268   Recent Labs    06/23/20 0312 06/23/20 1641 06/24/20 0801  NA 140  --  137  K 3.4*  --  3.3*  CL 103  --  102  CO2 24  --  22  GLUCOSE 139*  --  162*  BUN 20  --  17  CREATININE 1.81* 1.72* 1.47*  CALCIUM 9.1  --  8.9    Intake/Output Summary (Last 24 hours) at 06/24/2020 1341 Last data filed at 06/24/2020 0718 Gross per 24 hour  Intake 0 ml  Output -  Net 0 ml        Physical Exam: Vital Signs Blood pressure 123/82, pulse (!) 110, temperature 98.5 F (36.9 C), resp. rate 20, height 5\' 6"  (1.676 m), weight 82 kg, last menstrual period 06/03/2018, SpO2 95 %. Gen: no distress, normal appearing HEENT: oral mucosa pink and moist, NCAT Cardio: Tachycardic Chest: normal effort, normal rate of breathing Abd: soft, non-distended Ext: no edema Psych: pleasant, normal affect Musculoskeletal:  Cervical back: Normal range of motion. Norigidity.  Comments: UEs- poor compliance to coomands and poor effort MS: at least 4/5 in UEs- finger abduction slightly less, but would not try during exam LEs- RLE- HF 3+/5, KE 3+/5, at least 4/5  distally LLE- HF 3-/5, KE 3-/5, DF and PF 4/5 stronger on R side than L side in LEs  Skin: General: Skin is warmand dry.  Comments: Splotchy red splotches on 1st to 4th toes on L foot- new Dry elbows IV L hand- looks OK.  Big bruise on R lower abdomen with scattered bruising on abdomen from SQ Heparin as well as on UEs B/L Neurological:  Comments: Patient is alert. Mood is flat but appropriate. Makes eye contact with examiner. Provides her name and age. Follows simple commands. She cannot recall full events of her hospital stay. Appears anoxic- extremely flat affect; monotone Poor memory Ox1-2 Can't remember when had BMs last or when  vomited last.  Needed constant cues to participate at all in exam Psychiatric:  Comments: Flat, anoxic?   Assessment/Plan: 1. Functional deficits which require 3+ hours per day of interdisciplinary therapy in a comprehensive inpatient rehab setting.  Physiatrist is providing close team supervision and 24 hour management of active medical problems listed below.  Physiatrist and rehab team continue to assess barriers to discharge/monitor patient progress toward functional and medical goals  Care Tool:  Bathing    Body parts bathed by patient: Right arm,Left arm,Chest,Abdomen,Face,Left upper leg,Right upper leg,Right lower leg,Left lower leg   Body parts bathed by helper: Front perineal area,Buttocks     Bathing assist Assist Level: Minimal Assistance - Patient > 75%     Upper Body Dressing/Undressing Upper body dressing   What is the patient wearing?: Pull over shirt    Upper body assist Assist Level: Minimal Assistance - Patient > 75%    Lower Body Dressing/Undressing Lower body dressing      What is the patient wearing?: Incontinence brief,Pants     Lower body assist Assist for lower body dressing: Moderate Assistance - Patient 50 - 74%     Toileting Toileting    Toileting assist Assist for toileting: Total Assistance - Patient < 25% (incontinent)     Transfers Chair/bed transfer  Transfers assist     Chair/bed transfer assist level: Minimal Assistance - Patient > 75%     Locomotion Ambulation   Ambulation assist      Assist level: Minimal Assistance - Patient > 75% Assistive device: Walker-rolling Max distance: 50'   Walk 10 feet activity   Assist     Assist level: Minimal Assistance - Patient > 75% Assistive device: Walker-rolling   Walk 50 feet activity   Assist    Assist level: Minimal Assistance - Patient > 75% Assistive device: Walker-rolling    Walk 150 feet activity   Assist Walk 150 feet activity did not occur:  Safety/medical concerns         Walk 10 feet on uneven surface  activity   Assist Walk 10 feet on uneven surfaces activity did not occur: Safety/medical concerns         Wheelchair     Assist Will patient use wheelchair at discharge?: No             Wheelchair 50 feet with 2 turns activity    Assist            Wheelchair 150 feet activity     Assist          Blood pressure 123/82, pulse (!) 110, temperature 98.5 F (36.9 C), resp. rate 20, height 5\' 6"  (1.676 m), weight 82 kg, last menstrual period 06/03/2018, SpO2 95 %.  Medical Problem List and Plan: 1.Decreased functional mobilitysecondary to acute hypoxic  respiratory failure with compromised airway in the setting of cardiac arrest/CPR and aspiration pneumonitis. Extubated 2/20- with likely anoxic brain injury due to 3 minutes minimum of asystole-patient may shower -ELOS/Goals: goals Supervision to min A- 10-12 days?  Initial CIR evals today 2. Antithrombotics: -DVT/anticoagulation: Continue Subcutaneous heparin -antiplatelet therapy: N/A 3. Pain Management:Tylenol as needed- NO ZANAFLEX or other muscle relaxants- pt has specific recent hx of addiction to Zanaflex. 4. Mood/PTSD:Prozac 40 mg daily. Follow-up psychiatry services as needed -antipsychotic agents: Abilify 5 mg daily 5. Neuropsych: This patientis?capable of making decisions onherown behalf. 6. Skin/Wound Care:Routine skin checks 7. Fluids/Electrolytes/Nutrition:Routine in and outs with follow-up chemistries 8. AKI secondary to ischemic ATN following cardiac arrest. CRRT 06/08/2020 transition to hemodialysis 06/15/2020 currently on hold latest creatinine1.81. Follow-up hemodialysis as needed- when possible, suggest switching to Lovenox? 9. Anemia of critical illness. Small hematochezia and stool morning of 06/16/2020. Follow-up gastroenterology services as needed. Continue  Protonix  3/4: hgb 8.6, repeat on Monday.  10. Hypertensionwith new Hypotension. Continue ProAmatine 10 mg 3 times daily- I suggest since BP running [id number] systolic, that it be decreased to 7.5 mg TID. 11. Prediabetes. Hemoglobin A1c 6.1. SSI 12. Dysphagia. Dysphagia #2 thin liquid diet. Follow-up speech therapy- pt has Cortrak- is "picky' per husband- and doesn't eat much- explained to pt will need to get out before rehab d/c. 13. Right ankle fracture status post ORIF 03/28/2020. Weightbearing as toleratednow. Follow-up per Dr. Mardelle Matte 14. Substance abuse. UDS positive for amphetamines. Not prescribed. Outpatient follow-up- also per husband abusing 64 mg Zanaflexdaily prior to admission- doesn't want her to have pain meds or muscle relaxants unless absolutely needed.  15. Recent COVID-19 infection. Tested +1/18. Patient did not require treatment or hospitalization. Outside the window of isolation. 16. Class I obesity. BMI 30.74. Dietary follow-up 17. Bowel and bladder incontinence- new since hospitalization-  18. Chronic nausea- on IV Reglan- will change to PO- is chronic- Reglan is new- might do best to wean as tolerated. 19. Hypokalemia:   3/4: supplement 50meq and repeat on Monday.  20. Tachycardia: continue to monitor TID 21. Low protein: edited diet order to include high protein foods. Repeat Monday.   LOS: 1 days A FACE TO FACE EVALUATION WAS PERFORMED  Martha Clan P Raulkar 06/24/2020, 1:41 PM

## 2020-06-24 NOTE — Evaluation (Signed)
Occupational Therapy Assessment and Plan  Patient Details  Name: [name] MRN: [id number] Date of Birth: [birth year]  OT Diagnosis: apraxia, cognitive deficits, muscular wasting and disuse atrophy and muscle weakness (generalized) Rehab Potential: Rehab Potential (ACUTE ONLY): Good ELOS: 12-14 days   Today's Date: 06/24/2020 OT Individual Time: 1287-8676 OT Individual Time Calculation (min): 60 min     Hospital Problem: Active Problems:   Acute metabolic encephalopathy   Past Medical History:  Past Medical History:  Diagnosis Date  . Anxiety   . Arthritis    "joints ache all over" (10/15/2014)  . Barrett's esophagus   . Bulging lumbar disc   . Chronic lower back pain   . DDD (degenerative disc disease), cervical   . Depression   . Drug-seeking behavior   . Headache    "weekly" (10/15/2014)  . Hyperlipemia   . Hypertension   . PTSD (post-traumatic stress disorder)   . Skin cancer    "had them cut off my arms; don't know what kind"   Past Surgical History:  Past Surgical History:  Procedure Laterality Date  . ABLATION ON ENDOMETRIOSIS  2008  . BIOPSY  12/27/2018   Procedure: BIOPSY;  Surgeon: Thornton Park, MD;  Location: [facility];  Service: Gastroenterology;;  . ESOPHAGOGASTRODUODENOSCOPY (EGD) WITH PROPOFOL N/A 12/27/2018   Procedure: ESOPHAGOGASTRODUODENOSCOPY (EGD) WITH PROPOFOL;  Surgeon: Thornton Park, MD;  Location: [facility];  Service: Gastroenterology;  Laterality: N/A;  . HEMORRHOID SURGERY  ~ 2002  . IR FLUORO GUIDE CV LINE RIGHT  06/14/2020  . IR REMOVAL TUN CV CATH W/O FL  06/23/2020  . IR US GUIDE VASC ACCESS RIGHT  06/14/2020  . ORIF ANKLE FRACTURE Right 03/28/2020   Procedure: OPEN REDUCTION INTERNAL FIXATION (ORIF) RIGHT BIMALLEOLAR ANKLE FRACTURE;  Surgeon: Marchia Bond, MD;  Location: [facility];  Service: Orthopedics;  Laterality: Right;    Assessment & Plan Clinical Impression: Tracy Kaman. [name] is a 50 year old  right-handed female with history of depression, opiate use, PTSD with reported prior suicide attempts maintained on Prozac, Lamictal as well as Risperdal,, hypertension, Covid +1/18, endometriosis, ORIF right ankle 03/28/2020 and is weightbearing as tolerated and has been rather sedentary since her fracture.  Per chart review patient lives with spouse and 2 adult aged children.  1 level home 2 steps to entry.  Presented 06/08/2020 after reportedly falling out of the bed at some point during the night and family let her sleep on the floor when they later checked on her she was unresponsive.  First responders found her to be pulseless and asystolic.  She was intubated with a King airway and received CPR x3 minutes and epi x1 with ROSC.      Patient transferred to CIR on 06/23/2020 .    Patient currently requires mod with basic self-care skills secondary to muscle weakness, decreased cardiorespiratoy endurance, motor apraxia and decreased coordination, decreased awareness, decreased problem solving, decreased memory and delayed processing and decreased standing balance and decreased balance strategies.  Prior to hospitalization, patient could complete BADLs with min for the last 5 months, but prior to that was independent.   Patient will benefit from skilled intervention to increase independence with basic self-care skills prior to discharge home with care partner.  Anticipate patient will require 24 hour supervision and follow up home health.  OT - End of Session Endurance Deficit: Yes Endurance Deficit Description: Brief seated rest breaks needed for recovery during simple functional mobility tasks OT Assessment Rehab Potential (ACUTE ONLY): Good  OT Patient demonstrates impairments in the following area(s): Balance;Behavior;Cognition;Endurance;Motor;Sensory;Safety;Nutrition OT Basic ADL's Functional Problem(s): Eating;Grooming;Bathing;Dressing;Toileting OT Transfers Functional Problem(s):  Toilet;Tub/Shower OT Additional Impairment(s): None OT Plan OT Intensity: Minimum of 1-2 x/day, 45 to 90 minutes OT Frequency: 5 out of 7 days OT Duration/Estimated Length of Stay: 12-14 days OT Treatment/Interventions: Balance/vestibular training;Cognitive remediation/compensation;Discharge planning;DME/adaptive equipment instruction;Functional mobility training;Pain management;Psychosocial support;Patient/family education;Self Care/advanced ADL retraining;Therapeutic Activities;Therapeutic Exercise;UE/LE Strength taining/ROM;UE/LE Coordination activities OT Self Feeding Anticipated Outcome(s): independent OT Basic Self-Care Anticipated Outcome(s): supervision OT Toileting Anticipated Outcome(s): supervision OT Bathroom Transfers Anticipated Outcome(s): supervision OT Recommendation Recommendations for Other Services: Neuropsych consult Patient destination: Home Follow Up Recommendations: Home health OT Equipment Recommended: None recommended by OT Equipment Details: family has tub bench and BSC   OT Evaluation Precautions/Restrictions  Precautions Precautions: Fall Precaution Comments: cortrak Restrictions Weight Bearing Restrictions: No RLE Weight Bearing: Weight bearing as tolerated Pain Pain Assessment Pain Scale: Faces Pain Score: 0-No pain Faces Pain Scale: No hurt Home Living/Prior Functioning Home Living Family/patient expects to be discharged to:: Private residence Living Arrangements: Spouse/significant other,Children Available Help at Discharge: Family,Available 24 hours/day Type of Home: House Home Access: Stairs to enter [employer] of Steps: 4 Entrance Stairs-Rails: None Home Layout: One level Bathroom Shower/Tub: Tub/shower unit,Curtain  Lives With: Spouse,Son IADL History Education: finished HS and completed two year Associates Degree Criminal Justice Prior Function Level of Independence: Independent with gait,Independent with transfers,Needs  assistance with ADLs Bath: Minimal Toileting: Minimal Dressing: Minimal  Able to Take Stairs?: Yes Driving: No Vocation: On disability Leisure: Hobbies-yes (Comment) (shopping) Comments: per husband, patient was indep PTA but limited lifestyle - does not drive and stays at home mostly Vision Baseline Vision/History: No visual deficits Patient Visual Report: No change from baseline Vision Assessment?: No apparent visual deficits Perception  Perception: Within Functional Limits Praxis Praxis: Impaired Praxis Impairment Details: Ideomotor;Ideation;Initiation Praxis-Other Comments: delayed - will need further assessment in functional context Cognition Overall Cognitive Status: Impaired/Different from baseline Arousal/Alertness: Awake/alert Orientation Level: Person;Place;Situation Person: Oriented Place: Oriented Situation: Oriented Year: 2022 Month: March Day of Week: Correct Memory: Impaired Memory Impairment: Storage deficit;Retrieval deficit (recalled 3/4 words with 3-choice cue) Immediate Memory Recall: Sock;Blue;Bed Memory Recall Sock: With Cue Memory Recall Blue: With Cue Memory Recall Bed: With Cue Attention: Focused;Sustained Focused Attention: Impaired Focused Attention Impairment: Functional basic;Verbal basic Sustained Attention: Impaired Sustained Attention Impairment: Functional basic;Verbal basic Awareness: Impaired Awareness Impairment: Intellectual impairment Problem Solving: Impaired Problem Solving Impairment: Verbal basic;Functional basic Behaviors: Poor frustration tolerance Safety/Judgment: Impaired Sensation Sensation Light Touch: Appears Intact Hot/Cold: Appears Intact Proprioception: Appears Intact Stereognosis: Not tested Coordination Gross Motor Movements are Fluid and Coordinated: No Fine Motor Movements are Fluid and Coordinated: No Coordination and Movement Description: very slowed and delayed - effortful with movement patterns Heel  Shin Test: delayed - limited by cog and processing - no dyscoordination noted Motor  Motor Motor: Abnormal postural alignment and control;Other (comment) Motor - Skilled Clinical Observations: Blocked movement patterns - rigidity present with delayed motor initiation  Trunk/Postural Assessment  Cervical Assessment Cervical Assessment: Exceptions to [facility] (forward head carriage) Thoracic Assessment Thoracic Assessment: Exceptions to [facility] (rounded shoulders and kyphoic posturing) Lumbar Assessment Lumbar Assessment: Exceptions to [facility] (posterior pelvic tilt) Postural Control Postural Control: Deficits on evaluation Righting Reactions: delayed and inadequate Protective Responses: delayed and inadequate  Balance Balance Balance Assessed: Yes Static Sitting Balance Static Sitting - Balance Support: Feet supported;No upper extremity supported Static Sitting - Level of Assistance: 5: Stand by assistance Dynamic Sitting Balance Dynamic Sitting -  Balance Support: During functional activity;Feet unsupported Dynamic Sitting - Level of Assistance: 4: Min Insurance risk surveyor Standing - Balance Support: No upper extremity supported Static Standing - Level of Assistance: 4: Min assist Dynamic Standing Balance Dynamic Standing - Balance Support: During functional activity;Bilateral upper extremity supported Dynamic Standing - Level of Assistance: 4: Min assist Extremity/Trunk Assessment RUE Assessment Active Range of Motion (AROM) Comments: sh flexion to 120 General Strength Comments: 3+/5 throughout LUE Assessment Active Range of Motion (AROM) Comments: sh flexion to 120 General Strength Comments: 3+/5 throughout  Care Tool Care Tool Self Care Eating    pt has been refusing to eat    Oral Care    Oral Care Assist Level: Minimal Assistance - Patient > 75%    Bathing   Body parts bathed by patient: Right arm;Left arm;Chest;Abdomen;Face;Left upper leg;Right upper leg;Right  lower leg;Left lower leg Body parts bathed by helper: Front perineal area;Buttocks   Assist Level: Minimal Assistance - Patient > 75%    Upper Body Dressing(including orthotics)   What is the patient wearing?: Pull over shirt   Assist Level: Minimal Assistance - Patient > 75%    Lower Body Dressing (excluding footwear)   What is the patient wearing?: Incontinence brief;Pants Assist for lower body dressing: Moderate Assistance - Patient 50 - 74%    Putting on/Taking off footwear   What is the patient wearing?: Non-skid slipper socks Assist for footwear: Moderate Assistance - Patient 50 - 74%       Care Tool Toileting Toileting activity     pt incontinent     Care Tool Bed Mobility Roll left and right activity   Roll left and right assist level: Minimal Assistance - Patient > 75%    Sit to lying activity   Sit to lying assist level: Minimal Assistance - Patient > 75%    Lying to sitting edge of bed activity   Lying to sitting edge of bed assist level: Minimal Assistance - Patient > 75%     Care Tool Transfers Sit to stand transfer   Sit to stand assist level: Minimal Assistance - Patient > 75%    Chair/bed transfer   Chair/bed transfer assist level: Minimal Assistance - Patient > 75%     Toilet transfer   Assist Level: Moderate Assistance - Patient 50 - 74%     Care Tool Cognition Expression of Ideas and Wants Expression of Ideas and Wants: Frequent difficulty - frequently exhibits difficulty with expressing needs and ideas   Understanding Verbal and Non-Verbal Content Understanding Verbal and Non-Verbal Content: Sometimes understands - understands only basic conversations or simple, direct phrases. Frequently requires cues to understand   Memory/Recall Ability *first 3 days only Memory/Recall Ability *first 3 days only: That he or she is in a hospital/hospital unit;Current season    Refer to Care Plan for Long Term Goals  SHORT TERM GOAL WEEK 1 OT Short Term  Goal 1 (Week 1): Pt will be able to ambulate in and out of bathroom with min A with LRAD. OT Short Term Goal 2 (Week 1): Pt will be able to bathe self wtih CGA for balance. OT Short Term Goal 3 (Week 1): Pt will don pants with min A or less. OT Short Term Goal 4 (Week 1): Pt will be able to sit to stand with S to increase independence with LB self care. OT Short Term Goal 5 (Week 1): Pt will be able to toilet with min A.  Recommendations for  other services: Neuropsych   Skilled Therapeutic Intervention ADL ADL Grooming: Minimal cueing;Minimal assistance Where Assessed-Grooming: Sitting at sink Upper Body Bathing: Minimal cueing;Supervision/safety Where Assessed-Upper Body Bathing: Sitting at sink Lower Body Bathing: Minimal cueing;Moderate assistance Where Assessed-Lower Body Bathing: Sitting at sink;Standing at sink Upper Body Dressing: Minimal assistance;Minimal cueing Where Assessed-Upper Body Dressing: Wheelchair Lower Body Dressing: Moderate assistance;Minimal cueing Where Assessed-Lower Body Dressing: Wheelchair Toileting: Moderate cueing;Moderate assistance Where Assessed-Toileting: Glass blower/designer: Moderate assistance Toilet Transfer Method: Squat pivot Toilet Transfer Equipment: Grab bars Mobility  Transfers Sit to Stand: Minimal Assistance - Patient > 75% Stand to Sit: Minimal Assistance - Patient > 75%   Pt seen for initial evaluation and ADL training with a focus on activity tolerance and participation.  Discussed with pt and her spouse role of OT, OT POC, ELOS, and pt's goals.  Pt's spouse very helpful but will need cues at times to not help the pt too much, to give her time to try.  Pt is very weak and deconditioned but does have good potential to gain strength to return home.  She tends to be a "dependent" personality often asking for help with things she does not need help with, like opening her straw.  Pt is also using a cortrack for supplementation because she  does not like eating the food here.  Explained to her this is a very unhealthy approach and went over menu with her identifying foods she does enjoy eating.  Pt was able to complete her mobility and self care at a min -mod A level, so needs to focus on her own initiation and motivation.  Discussed this with pt and spouse.  Pt resting in wc with belt alarm on and all needs met.   Discharge Criteria: Patient will be discharged from OT if patient refuses treatment 3 consecutive times without medical reason, if treatment goals not met, if there is a change in medical status, if patient makes no progress towards goals or if patient is discharged from hospital.  The above assessment, treatment plan, treatment alternatives and goals were discussed and mutually agreed upon: by patient and by family  Leisha Trinkle 06/24/2020, 1:01 PM

## 2020-06-24 NOTE — Evaluation (Signed)
Speech Language Pathology Assessment and Plan  Patient Details  Name: [name] MRN: [id number] Date of Birth: 14-Apr-1971  SLP Diagnosis: Cognitive Impairments;Speech and Language deficits  Rehab Potential: Fair ELOS: 2 weeks    Today's Date: 06/24/2020 SLP Individual Time: 1100-1140 SLP Individual Time Calculation (min): 40 min   Hospital Problem: Active Problems:   Acute metabolic encephalopathy  Past Medical History:  Past Medical History:  Diagnosis Date  . Anxiety   . Arthritis    "joints ache all over" (10/15/2014)  . Barrett's esophagus   . Bulging lumbar disc   . Chronic lower back pain   . DDD (degenerative disc disease), cervical   . Depression   . Drug-seeking behavior   . Headache    "weekly" (10/15/2014)  . Hyperlipemia   . Hypertension   . PTSD (post-traumatic stress disorder)   . Skin cancer    "had them cut off my arms; don't know what kind"   Past Surgical History:  Past Surgical History:  Procedure Laterality Date  . ABLATION ON ENDOMETRIOSIS  2008  . BIOPSY  12/27/2018   Procedure: BIOPSY;  Surgeon: Thornton Park, MD;  Location: [facility];  Service: Gastroenterology;;  . ESOPHAGOGASTRODUODENOSCOPY (EGD) WITH PROPOFOL N/A 12/27/2018   Procedure: ESOPHAGOGASTRODUODENOSCOPY (EGD) WITH PROPOFOL;  Surgeon: Thornton Park, MD;  Location: [facility];  Service: Gastroenterology;  Laterality: N/A;  . HEMORRHOID SURGERY  ~ 2002  . IR FLUORO GUIDE CV LINE RIGHT  06/14/2020  . IR REMOVAL TUN CV CATH W/O FL  06/23/2020  . IR US GUIDE VASC ACCESS RIGHT  06/14/2020  . ORIF ANKLE FRACTURE Right 03/28/2020   Procedure: OPEN REDUCTION INTERNAL FIXATION (ORIF) RIGHT BIMALLEOLAR ANKLE FRACTURE;  Surgeon: Marchia Bond, MD;  Location: [place];  Service: Orthopedics;  Laterality: Right;    Assessment / Plan / Recommendation  [name] is a 50 year old right-handed female with history of depression, opiate use, PTSDwith reported  prior suicide attempts maintained on Prozac, Lamictal as well as Risperdal,, hypertension, Covid +1/18, endometriosis, ORIF right ankle 03/28/2020 and is weightbearing as toleratedand has been rather sedentary since her fracture.Per chart review patient lives with spouse and 2 adult aged children. 1 level home 2 steps to entry. Presented 06/08/2020 after reportedly falling out of the bed at some point during the night and family let her sleep on the floor when they later checked on her she was unresponsive. First responders found her to be pulseless and asystolic. She was intubated with a King airway and received CPR x3 minutes and epi x1 with ROSC. Patient transferred to CIR on 06/23/2020 .    Clinical Impression Patient presents with a moderate cognitive-linguistic impairment and mild-moderate oropharyngeal dysaphagia. Patient was oriented to time and place but demonstrated moderate impairments in delayed recall, reasoning, verbal problem solving, initiation. She spoke mainly at one-word level but did produce a couple 2-3 word utterances when prompted. Overall participation is poor, patient with flat affect, exhibited delayed responses requiring repetition and verbal cues to consistently respond. Her dysphagia does appear to have some influence from current cognitive level, as she appeared to be with delayed initiation of swallow leading to coughing and throat clearing with water (thin) but also when eating very small bite of saltine cracker. SLP suspects patient is also holding boluses in mouth and likely exhibiting some premature spillage into pharynx prior to initiating swallow. Patient has Cortrak which is providing primary nutrition as patient has been eating very little of hospital PO's. Currently,  patient is requiring 24 hour supervision for safety, requires supervision and cues to encourage PO intake, has a Cortrak feeding tube and is not cognitively capable of performing even basic ADL's without  moderate to maximal amount of cues and assistance.    Skilled Therapeutic Interventions          Bedside swallow eval, cognitive eval (portions of Cognistat)  SLP Assessment  Patient will need skilled Speech Lanaguage Pathology Services during CIR admission    Recommendations  SLP Diet Recommendations: Dysphagia 3 (Mech soft);Thin Liquid Administration via: Cup;Straw Medication Administration: Whole meds with puree Supervision: Patient able to self feed;Full supervision/cueing for compensatory strategies Compensations: Slow rate;Small sips/bites;Minimize environmental distractions Postural Changes and/or Swallow Maneuvers: Seated upright 90 degrees;Upright 30-60 min after meal Oral Care Recommendations: Oral care BID Recommendations for Other Services: Neuropsych consult Patient destination: Home Follow up Recommendations: Other (comment) (pending progress) Equipment Recommended: None recommended by SLP    SLP Frequency 3 to 5 out of 7 days   SLP Duration  SLP Intensity  SLP Treatment/Interventions 2 weeks  Minumum of 1-2 x/day, 30 to 90 minutes  Cognitive remediation/compensation;Dysphagia/aspiration precaution training;Internal/external aids;Environmental controls;Cueing hierarchy;Functional tasks;Patient/family education;Speech/Language facilitation    Pain Pain Assessment Pain Scale: Faces Pain Score: 0-No pain Faces Pain Scale: No hurt  Prior Functioning Cognitive/Linguistic Baseline: Information not available (recent past per patient's spouse is she was very sedentary since November, primarily watching TV and going on the internet) Type of Home: House  Lives With: Spouse;Son Available Help at Discharge: Family;Available 24 hours/day Education: finished HS and completed two year Associates Degree Criminal Justice Vocation: On disability  SLP Evaluation Cognition Overall Cognitive Status: Impaired/Different from baseline Arousal/Alertness: Awake/alert Orientation  Level: Oriented X4 Attention: Focused;Sustained Focused Attention Impairment: Functional basic;Verbal basic Sustained Attention: Impaired Sustained Attention Impairment: Functional basic;Verbal basic Memory: Impaired Memory Impairment: Storage deficit;Retrieval deficit (recalled 3/4 words with 3-choice cue) Immediate Memory Recall: Sock;Blue;Bed Memory Recall Sock: With Cue Memory Recall Blue: With Cue Memory Recall Bed: With Cue Awareness: Impaired Awareness Impairment: Intellectual impairment Problem Solving: Impaired Problem Solving Impairment: Verbal basic;Functional basic Behaviors: Poor frustration tolerance Safety/Judgment: Impaired  Comprehension Auditory Comprehension Overall Auditory Comprehension: Impaired Commands: Within Functional Limits Conversation: Simple Interfering Components: Attention;Processing speed;Working Field seismologist: Barista: Not tested Reading Comprehension Reading Status: Not tested Expression Expression Primary Mode of Expression: Verbal Verbal Expression Overall Verbal Expression: Impaired Initiation: Impaired Level of Generative/Spontaneous Verbalization: Word;Phrase Naming: No impairment Pragmatics: Impairment Impairments: Abnormal affect;Monotone;Eye contact;Topic maintenance Interfering Components: Attention Effective Techniques: Open ended questions Non-Verbal Means of Communication: Not applicable Written Expression Dominant Hand: Left Written Expression: Not tested Oral Motor Oral Motor/Sensory Function Overall Oral Motor/Sensory Function: Mild impairment Facial ROM: Within Functional Limits Facial Symmetry: Within Functional Limits Facial Strength: Reduced right;Reduced left Facial Sensation: Within Functional Limits Lingual ROM: Reduced right;Reduced left Lingual Symmetry: Within Functional Limits Lingual Strength:  Reduced Lingual Sensation: Within Functional Limits Motor Speech Overall Motor Speech: Appears within functional limits for tasks assessed Respiration: Within functional limits  Care Tool Care Tool Cognition Expression of Ideas and Wants     Understanding Verbal and Non-Verbal Content     Memory/Recall Ability *first 3 days only       PMSV Assessment  PMSV Trial    Bedside Swallowing Assessment General Date of Onset: 06/12/20 Previous Swallow Assessment: MBS on 2/23 Diet Prior to this Study: Dysphagia 2 (chopped);Thin liquids Temperature Spikes Noted: No Respiratory Status: Room air History of Recent Intubation: Yes Length of Intubations (  days): 4 days Date extubated: 06/12/20 Behavior/Cognition: Alert;Cooperative;Requires cueing Oral Cavity - Dentition: Dentures, bottom;Dentures, top Self-Feeding Abilities: Needs set up;Needs assist Vision: Functional for self-feeding Patient Positioning: Upright in chair/Tumbleform Baseline Vocal Quality: Normal;Other (comment) (strained) Volitional Cough: Cognitively unable to elicit Volitional Swallow: Able to elicit  Oral Care Assessment   Ice Chips Ice chips: Impaired Oral Phase Impairments: Impaired mastication Thin Liquid Thin Liquid: Impaired Presentation: Cup Oral Phase Impairments: Reduced labial seal Pharyngeal  Phase Impairments: Suspected delayed Swallow;Throat Clearing - Immediate;Cough - Delayed Nectar Thick  NT Honey Thick   NT Puree Puree: Not tested Solid Solid: Impaired Oral Phase Impairments: Impaired mastication Pharyngeal Phase Impairments: Throat Clearing - Delayed BSE Assessment Risk for Aspiration Impact on safety and function: Mild aspiration risk Other Related Risk Factors: Cognitive impairment;Prolonged intubation  Short Term Goals: Week 1: SLP Short Term Goal 1 (Week 1): Patient will tolerate Dys 3, thin liquids with minimal instances of coughing/throat clearing and with supervisionA. SLP  Short Term Goal 2 (Week 1): Patient will maintain attention to functional basic level tasks for increments of 2-3 minutes with modA. SLP Short Term Goal 3 (Week 1): Patient will respond to open ended questions at phrase level with modA cues to expand/elaborate. SLP Short Term Goal 4 (Week 1): Patient will consume regular texture PO's with minimal difficulty/delay in mastication and minimal instances of cough/throat clear.  Refer to Care Plan for Long Term Goals  Recommendations for other services: Neuropsych  Discharge Criteria: Patient will be discharged from SLP if patient refuses treatment 3 consecutive times without medical reason, if treatment goals not met, if there is a change in medical status, if patient makes no progress towards goals or if patient is discharged from hospital.  The above assessment, treatment plan, treatment alternatives and goals were discussed and mutually agreed upon: by family    Sonia Baller, MA, CCC-SLP Speech Therapy

## 2020-06-24 NOTE — Progress Notes (Signed)
Inpatient Rehabilitation Care Coordinator Assessment and Plan Patient Details  Name: [name] MRN: [id number] Date of Birth: 1970/12/05  Today's Date: 06/24/2020  Hospital Problems: Active Problems:   Acute metabolic encephalopathy  Past Medical History:  Past Medical History:  Diagnosis Date  . Anxiety   . Arthritis    "joints ache all over" (10/15/2014)  . Barrett's esophagus   . Bulging lumbar disc   . Chronic lower back pain   . DDD (degenerative disc disease), cervical   . Depression   . Drug-seeking behavior   . Headache    "weekly" (10/15/2014)  . Hyperlipemia   . Hypertension   . PTSD (post-traumatic stress disorder)   . Skin cancer    "had them cut off my arms; don't know what kind"   Past Surgical History:  Past Surgical History:  Procedure Laterality Date  . ABLATION ON ENDOMETRIOSIS  2008  . BIOPSY  12/27/2018   Procedure: BIOPSY;  Surgeon: Thornton Park, MD;  Location: [facility];  Service: Gastroenterology;;  . ESOPHAGOGASTRODUODENOSCOPY (EGD) WITH PROPOFOL N/A 12/27/2018   Procedure: ESOPHAGOGASTRODUODENOSCOPY (EGD) WITH PROPOFOL;  Surgeon: Thornton Park, MD;  Location: [facility];  Service: Gastroenterology;  Laterality: N/A;  . HEMORRHOID SURGERY  ~ 2002  . IR FLUORO GUIDE CV LINE RIGHT  06/14/2020  . IR REMOVAL TUN CV CATH W/O FL  06/23/2020  . IR US GUIDE VASC ACCESS RIGHT  06/14/2020  . ORIF ANKLE FRACTURE Right 03/28/2020   Procedure: OPEN REDUCTION INTERNAL FIXATION (ORIF) RIGHT BIMALLEOLAR ANKLE FRACTURE;  Surgeon: Marchia Bond, MD;  Location: [place];  Service: Orthopedics;  Laterality: Right;   Social History:  reports that she has never smoked. She has never used smokeless tobacco. She reports previous alcohol use. She reports that she does not use drugs.  Family / Support Systems Marital Status: Married Patient Roles: Spouse,Parent Spouse/Significant Other: Randall [phone] Children: Two son's one is an [employer] student Other Supports: friends Anticipated Caregiver: Husband and son's Ability/Limitations of Caregiver: Husband is retired but takes son to [employer] daily for school since he doesn;t drive. Caregiver Availability: Other (Comment) (Almost 24 hr maybe one hour will be alone) Family Dynamics: Close knit family who pull together to assist one another. Husband is pt's main caregiver and pt was doing more until fractured ankle in 03/2020. Husband seems to be very supportive of pt  Social History Preferred language: English Religion: Baptist Cultural Background: No issues Education: HS Read: Yes Write: Yes Employment Status: Disabled Date Retired/Disabled/Unemployed: 2014 Public relations account executive Issues: No issues Guardian/Conservator: None-according to MD pt is not fully capable of making her own decisions while here, will look toward her husband if any decisions need to be made while here   Abuse/Neglect Abuse/Neglect Assessment Can Be Completed: Yes Physical Abuse: Denies Verbal Abuse: Denies Sexual Abuse: Denies Exploitation of patient/patient's resources: Denies Self-Neglect: Denies  Emotional Status Pt's affect, behavior and adjustment status: Pt was not in the mood to talk to Education officer, museum, she motioned she maybe would but did not. Didn't talk to husband either when social worker in her room. Husband reports she was moving more before her ankle fracture and then she got COVID in 04/2020. He hopes she can be mobile and moving like she did before all of this. He reports she does what she wants and is stubborn regarding this. Recent Psychosocial Issues: other health issues Psychiatric History: History of PTSD, depression/anxiety takes medications for this and was seeing someone  virtually but unsure if still is. Husband wants her meds decreased since feel she takes too many. Will ask neuro-psych to see while here. Was seen by psychiatry on acute and recommendation is OP substance abuse  program. Will work on this Substance Abuse History: Takes multiple medications some not prescribed-according to chart. Recommendation is OP substance abuse program will see if pt is receptive to this  Patient / Family Perceptions, Expectations & Goals Pt/Family understanding of illness & functional limitations: Husband is able to explain her condition and speaks with the MD to get updates on wife's condition. Pt at this time is choosing to not communicate with worker. Will see as days progress if she comes around since she is able to talk. Premorbid pt/family roles/activities: Mom, wife, retiree, etc Anticipated changes in roles/activities/participation: resume Pt/family expectations/goals: Husband states: " I hope shs does well here and progresses, we all want the best for her."  Pt gestured maybe she would talk or maybe not and she chose not too.  Community Resources [facility]: Other (Comment) (has HH in the past) Premorbid Home Care/DME Agencies: Other (Comment) (has wheelchair, rw, bsc, tub bench) Transportation available at discharge: Husband no one else in the family drives Resource referrals recommended: Neuropsychology  Discharge Planning Living Arrangements: Spouse/significant other,Children Support Systems: Spouse/significant other,Children,Friends/neighbors Type of Residence: Private residence Insurance Resources: [employer] Financial Resources: Family Support,[employer] Financial Screen Referred: No Living Expenses: Lives with family Money Management: Spouse Does the patient have any problems obtaining your medications?: No Home Management: Husband Patient/Family Preliminary Plans: Return home with husband and son's. Husband is her main caregiver and will be the one providing care to her at discharge. Husband plans to be here daily and see her progress. Care Coordinator Barriers to Discharge: Medication compliance Care Coordinator Anticipated Follow Up Needs: HH/OP  Clinical  Impression Flat appearing pt who did not attempt to communicate with worker or answer any questions. She did not communicate with husband either when he asked her too. Pt has history of PTSD, depression/anxiety and multiple suicide attempts-have asked neuro-psych to see while here, was seen by psychiatry on acute and recommendation is OP-substance abuse program. Will await team's evaluations and work on safe discharge plan for pt. See if pt will open up to worker t he longer she is here.  Elease Hashimoto 06/24/2020, 10:53 AM

## 2020-06-25 DIAGNOSIS — G931 Anoxic brain damage, not elsewhere classified: Secondary | ICD-10-CM

## 2020-06-25 LAB — GLUCOSE, CAPILLARY
Glucose-Capillary: 104 mg/dL — ABNORMAL HIGH (ref 70–99)
Glucose-Capillary: 152 mg/dL — ABNORMAL HIGH (ref 70–99)
Glucose-Capillary: 105 mg/dL — ABNORMAL HIGH (ref 70–99)
Glucose-Capillary: 109 mg/dL — ABNORMAL HIGH (ref 70–99)
Glucose-Capillary: 152 mg/dL — ABNORMAL HIGH (ref 70–99)

## 2020-06-25 MED ORDER — RENA-VITE PO TABS
1.0000 | ORAL_TABLET | Freq: Every day | ORAL | Status: DC
  Administered 2020-06-25 – 2020-07-01 (×7): 1 via ORAL
  Filled 2020-06-25 (×7): qty 1

## 2020-06-25 MED ORDER — MIDODRINE HCL 5 MG PO TABS
5.0000 mg | ORAL_TABLET | Freq: Three times a day (TID) | ORAL | Status: DC
  Administered 2020-06-25: 5 mg
  Filled 2020-06-25: qty 1

## 2020-06-25 MED ORDER — SIMETHICONE 40 MG/0.6ML PO SUSP
20.0000 mg | Freq: Four times a day (QID) | ORAL | Status: DC
  Administered 2020-06-25 – 2020-07-02 (×27): 20 mg via ORAL
  Filled 2020-06-25 (×27): qty 0.6

## 2020-06-25 MED ORDER — MIDODRINE HCL 5 MG PO TABS
5.0000 mg | ORAL_TABLET | Freq: Three times a day (TID) | ORAL | Status: DC
  Administered 2020-06-25 (×2): 5 mg via ORAL
  Filled 2020-06-25 (×2): qty 1

## 2020-06-25 MED ORDER — PANTOPRAZOLE SODIUM 40 MG PO PACK
40.0000 mg | PACK | ORAL | Status: DC
  Administered 2020-06-25 – 2020-06-27 (×3): 40 mg via ORAL
  Filled 2020-06-25 (×4): qty 20

## 2020-06-25 MED ORDER — ARIPIPRAZOLE 10 MG PO TABS
5.0000 mg | ORAL_TABLET | Freq: Every day | ORAL | Status: DC
  Administered 2020-06-26 – 2020-07-02 (×7): 5 mg via ORAL
  Filled 2020-06-25 (×7): qty 1

## 2020-06-25 MED ORDER — FLUOXETINE HCL 20 MG PO CAPS
40.0000 mg | ORAL_CAPSULE | Freq: Every day | ORAL | Status: DC
Start: 2020-06-26 — End: 2020-07-02
  Administered 2020-06-26 – 2020-07-02 (×7): 40 mg via ORAL
  Filled 2020-06-25 (×7): qty 2

## 2020-06-25 MED ORDER — POLYETHYLENE GLYCOL 3350 17 G PO PACK: 17.0000 g | PACK | Freq: Every day | ORAL | Status: DC | PRN

## 2020-06-25 MED ORDER — ZOLPIDEM TARTRATE 5 MG PO TABS
10.0000 mg | ORAL_TABLET | Freq: Every evening | ORAL | Status: DC | PRN
  Administered 2020-06-25 – 2020-07-01 (×7): 10 mg via ORAL
  Filled 2020-06-25 (×7): qty 2

## 2020-06-25 NOTE — Progress Notes (Signed)
Occupational Therapy Session Note  Patient Details  Name: SHANYIA STINES MRN: [id number] Date of Birth: [birth year]  Today's Date: 06/25/2020 OT Individual Time: 0820-0850 OT Individual Time Calculation (min): 30 min  and Today's Date: 06/25/2020 OT Missed Time: 15 Minutes Missed Time Reason: Patient ill (comment) (nausea)   Short Term Goals: Week 1:  OT Short Term Goal 1 (Week 1): Pt will be able to ambulate in and out of bathroom with min A with LRAD. OT Short Term Goal 2 (Week 1): Pt will be able to bathe self wtih CGA for balance. OT Short Term Goal 3 (Week 1): Pt will don pants with min A or less. OT Short Term Goal 4 (Week 1): Pt will be able to sit to stand with S to increase independence with LB self care. OT Short Term Goal 5 (Week 1): Pt will be able to toilet with min A.  Skilled Therapeutic Interventions/Progress Updates:    1:1. Pt received in bed in sidelying slid down to the foot of bed with NG tube still running. Pt incontinent of urine and bowel all over bed and gown. Utilized stedy for prolonged stand and perch positions for cleaning/changing gown. Pt refuses to cleanse peri area and gagging at smell as pt nauseous at basline all night. Pt requires increased time to initiate sit to stnads and donning fresh gown. Pt adamant about returning to bed d/t feeling ill and not wanting to stay OOB or complete any tx. Upon return pt with no controlled decent to EOB and leans back pushing stedy out from under feet nearly sliding off EOB. +2 to safely remove stedy and assist pt back to supine. Exited session with pt seated in bed missing 15 min skilled OT d/t nausea, exit alarm on and call light in reach   Therapy Documentation Precautions:  Precautions Precautions: Fall Precaution Comments: cortrak Restrictions Weight Bearing Restrictions: No RLE Weight Bearing: Weight bearing as tolerated General:   Vital Signs:  Pain:   ADL: ADL Grooming: Minimal cueing,Minimal  assistance Where Assessed-Grooming: Sitting at sink Upper Body Bathing: Minimal cueing,Supervision/safety Where Assessed-Upper Body Bathing: Sitting at sink Lower Body Bathing: Minimal cueing,Moderate assistance Where Assessed-Lower Body Bathing: Sitting at sink,Standing at sink Upper Body Dressing: Minimal assistance,Minimal cueing Where Assessed-Upper Body Dressing: Wheelchair Lower Body Dressing: Moderate assistance,Minimal cueing Where Assessed-Lower Body Dressing: Wheelchair Toileting: Moderate cueing,Moderate assistance Where Assessed-Toileting: Glass blower/designer: Moderate assistance Toilet Transfer Method: Squat pivot Toilet Transfer Equipment: Systems analyst    Praxis   Exercises:   Other Treatments:     Therapy/Group: Individual Therapy  Tonny Branch 06/25/2020, 8:50 AM

## 2020-06-25 NOTE — Plan of Care (Signed)
  Problem: RH BOWEL ELIMINATION Goal: RH STG MANAGE BOWEL WITH ASSISTANCE Description: STG Manage Bowel with mod Assistance. Outcome: Not Progressing; c/o nausea; abdominal pain   Problem: RH BLADDER ELIMINATION Goal: RH STG MANAGE BLADDER WITH ASSISTANCE Description: STG Manage Bladder With mod Assistance Outcome: Not Progressing; incontinence   Problem: RH KNOWLEDGE DEFICIT BRAIN INJURY Goal: RH STG INCREASE KNOWLEDGE OF SELF CARE AFTER BRAIN INJURY Description: Patient/family will be able to describe health care for patient following rehab with cues/handouts Outcome: Not Progressing; periods of confusion

## 2020-06-25 NOTE — Progress Notes (Signed)
Physical Therapy Session Note  Patient Details  Name: [name] MRN: [id number] Date of Birth: 1971/03/23   Upon entrance into room, patient sleeping heavily but awakens to voice. With introduction, patient refuses therapy and reports she's been vomiting and sick, defer's any therapy. Attempted to encourage her at least to get OOB to chair but patient continued to refuse. RN notified at end of session. Pt missed 45 minutes of skilled therapy.  Therapy Documentation Precautions:  Precautions Precautions: Fall Precaution Comments: cortrak Restrictions Weight Bearing Restrictions: No RLE Weight Bearing: Weight bearing as tolerated General: PT Amount of Missed Time (min): 45 Minutes PT Missed Treatment Reason: Patient unwilling to participate  [place] PT 06/25/2020, 3:02 PM

## 2020-06-25 NOTE — Progress Notes (Signed)
Occupational Therapy Session Note  Patient Details  Name: [name] MRN: [id number] Date of Birth: 05-17-1970  Today's Date: 06/25/2020 OT Individual Time: 1055-1110 OT Individual Time Calculation (min): 15 min    Short Term Goals: Week 1:  OT Short Term Goal 1 (Week 1): Pt will be able to ambulate in and out of bathroom with min A with LRAD. OT Short Term Goal 2 (Week 1): Pt will be able to bathe self wtih CGA for balance. OT Short Term Goal 3 (Week 1): Pt will don pants with min A or less. OT Short Term Goal 4 (Week 1): Pt will be able to sit to stand with S to increase independence with LB self care. OT Short Term Goal 5 (Week 1): Pt will be able to toilet with min A.  Skilled Therapeutic Interventions/Progress Updates:    "Can I just be left alone today?".  Pt positioned semi upright in bed, nodding head yes to feeling nauseous, husband at bedside.  OT gently provided therapeutic use of self to facilitate pt participation, however pt reporting she just wants to stay in bed at this time.  Noted slight soiling of brief (stool smear),and pt agreeable to changing at bed level.  Pt rolled to left and right using bed rails with supervision.  Encouraged pt to participate during pericare, however pt responding "I don't need to be touching that", therefore dependent pericare and clothing mgt completed. Total assist +2 to reposition pt towards HOB, with max multimodal cues to facilitate pt active BLE bridging technique with minimal pt effort noted.  OT offered classical relaxation music, and pt seeming receptive nodding "yes", therefore left pt with music softly playing to facilitate reduced anxiety and agitation.  Call bell in reach, bed alarm on at end of session.  Pt missed 45 minutes of treatment session.    Therapy Documentation Precautions:  Precautions Precautions: Fall Precaution Comments: cortrak Restrictions Weight Bearing Restrictions: No RLE Weight Bearing: Weight bearing as  tolerated   Therapy/Group: Individual Therapy  Ezekiel Slocumb 06/25/2020, 11:33 AM

## 2020-06-25 NOTE — Progress Notes (Addendum)
Patient vomited; husband in room with food from chick [employer] trying to feed patient; RN educated husband that patient is on D3 diet.He claims therapy  told him to bring food cannot pinpoint actually.  RN educated husband when RN saw he was trying to fix Cortrack 's tape and holding a  scissor. RN educated husband that only RN's are  to fix the cortrak.  He claims he saw the  scissor  in room  and was just helping. Scissors removed from room and RN secured cortrak.

## 2020-06-25 NOTE — Progress Notes (Addendum)
PROGRESS NOTE   Subjective/Complaints: Was nauseated with TF last noc, slept a short time with ambien but was up much of night currently sleeping   ROS: unable to obtain given cognitive deficits   Objective:   DG Abd 1 View  Result Date: 06/23/2020 CLINICAL DATA:  Ileus, abdominal pain. EXAM: ABDOMEN - 1 VIEW COMPARISON:  June 16, 2020. FINDINGS: The bowel gas pattern is normal. No radio-opaque calculi or other significant radiographic abnormality are seen. IMPRESSION: Negative. Electronically Signed   By: Marijo Conception M.D.   On: 06/23/2020 12:51   IR Removal Tun Cv Cath W/O FL  Result Date: 06/24/2020 INDICATION: Patient with acute renal failure, status post tunneled dialysis catheter placement 06/14/2020. She has experienced renal recovery and is no longer in need of the catheter. Request is made for removal. EXAM: REMOVAL TUNNELED CENTRAL VENOUS CATHETER MEDICATIONS: None ANESTHESIA/SEDATION: None FLUOROSCOPY TIME:  None COMPLICATIONS: None immediate. PROCEDURE: Informed written consent was obtained from the patient after a thorough discussion of the procedural risks, benefits and alternatives. All questions were addressed. Maximal Sterile Barrier Technique was utilized including caps, mask, sterile gowns, sterile gloves, sterile drape, hand hygiene and skin antiseptic. A timeout was performed prior to the initiation of the procedure. The patient's right chest and catheter was prepped and draped in a normal sterile fashion. Heparin was removed from both ports of catheter. 1% lidocaine was used for local anesthesia. Using gentle blunt dissection the cuff of the catheter was exposed and the catheter was removed in it's entirety. Pressure was held till hemostasis was obtained. A sterile dressing was applied. The patient tolerated the procedure well with no immediate complications. IMPRESSION: Successful catheter removal as described  above. Read by: Brynda Greathouse PA-C Electronically Signed   By: Ruthann Cancer MD   On: 06/24/2020 09:15   Recent Labs    06/23/20 1641 06/24/20 0801  WBC 6.5 5.8  HGB 9.1* 8.6*  HCT 28.9* 26.7*  PLT 302 268   Recent Labs    06/23/20 0312 06/23/20 1641 06/24/20 0801  NA 140  --  137  K 3.4*  --  3.3*  CL 103  --  102  CO2 24  --  22  GLUCOSE 139*  --  162*  BUN 20  --  17  CREATININE 1.81* 1.72* 1.47*  CALCIUM 9.1  --  8.9    Intake/Output Summary (Last 24 hours) at 06/25/2020 0733 Last data filed at 06/25/2020 0600 Gross per 24 hour  Intake 500 ml  Output 400 ml  Net 100 ml        Physical Exam: Vital Signs Blood pressure 130/84, pulse (!) 104, temperature 98.7 F (37.1 C), temperature source Oral, resp. rate 20, height 5\' 6"  (1.676 m), weight 82 kg, last menstrual period 06/03/2018, SpO2 96 %.  General: No acute distress Mood and affect are appropriate Heart: Regular rate and rhythm no rubs murmurs or extra sounds Lungs: Clear to auscultation, breathing unlabored, no rales or wheezes Abdomen: Positive bowel sounds, soft nontender to palpation, mildly distended, +tympany Extremities: No clubbing, cyanosis, or edema Skin: No evidence of breakdown, no evidence of rash Neurologic:somnolent did not cooperate  Musculoskeletal: Full range of motion in all 4 extremities. No joint swelling    Assessment/Plan: 1. Functional deficits which require 3+ hours per day of interdisciplinary therapy in a comprehensive inpatient rehab setting.  Physiatrist is providing close team supervision and 24 hour management of active medical problems listed below.  Physiatrist and rehab team continue to assess barriers to discharge/monitor patient progress toward functional and medical goals  Care Tool:  Bathing    Body parts bathed by patient: Right arm,Left arm,Chest,Abdomen,Face,Left upper leg,Right upper leg,Right lower leg,Left lower leg   Body parts bathed by helper: Front  perineal area,Buttocks     Bathing assist Assist Level: Minimal Assistance - Patient > 75%     Upper Body Dressing/Undressing Upper body dressing   What is the patient wearing?: Hospital gown only    Upper body assist Assist Level: Minimal Assistance - Patient > 75%    Lower Body Dressing/Undressing Lower body dressing      What is the patient wearing?: Incontinence brief     Lower body assist Assist for lower body dressing: Moderate Assistance - Patient 50 - 74%     Toileting Toileting    Toileting assist Assist for toileting: Total Assistance - Patient < 25% (incontinent)     Transfers Chair/bed transfer  Transfers assist     Chair/bed transfer assist level: Minimal Assistance - Patient > 75%     Locomotion Ambulation   Ambulation assist      Assist level: Minimal Assistance - Patient > 75% Assistive device: Walker-rolling Max distance: 50'   Walk 10 feet activity   Assist     Assist level: Minimal Assistance - Patient > 75% Assistive device: Walker-rolling   Walk 50 feet activity   Assist    Assist level: Minimal Assistance - Patient > 75% Assistive device: Walker-rolling    Walk 150 feet activity   Assist Walk 150 feet activity did not occur: Safety/medical concerns         Walk 10 feet on uneven surface  activity   Assist Walk 10 feet on uneven surfaces activity did not occur: Safety/medical concerns         Wheelchair     Assist Will patient use wheelchair at discharge?: No             Wheelchair 50 feet with 2 turns activity    Assist            Wheelchair 150 feet activity     Assist          Blood pressure 130/84, pulse (!) 104, temperature 98.7 F (37.1 C), temperature source Oral, resp. rate 20, height 5\' 6"  (1.676 m), weight 82 kg, last menstrual period 06/03/2018, SpO2 96 %.  Medical Problem List and Plan: 1.Decreased functional mobilitysecondary to acute hypoxic respiratory  failure with compromised airway in the setting of cardiac arrest/CPR and aspiration pneumonitis. Extubated 2/20- with likely anoxic brain injury due to 3 minutes minimum of asystole-patient may shower -ELOS/Goals: goals Supervision to min A- 10-12 days?  CIR PT,OT, SLP  2. Antithrombotics: -DVT/anticoagulation: Continue Subcutaneous heparin -antiplatelet therapy: N/A 3. Pain Management:Tylenol as needed- NO ZANAFLEX or other muscle relaxants- pt has specific recent hx of addiction to Zanaflex. 4. Mood/PTSD:Prozac 40 mg daily. Follow-up psychiatry services as needed -antipsychotic agents: Abilify 5 mg daily 5. Neuropsych: This patientis?capable of making decisions onherown behalf. 6. Skin/Wound Care:Routine skin checks 7. Fluids/Electrolytes/Nutrition:Routine in and outs with follow-up chemistries 8. AKI secondary to ischemic ATN following cardiac arrest.  CRRT 06/08/2020 transition to hemodialysis 06/15/2020 currently on hold latest creatinine1.81. Follow-up hemodialysis as needed- when possible, suggest switching to Lovenox? 9. Anemia of critical illness. Small hematochezia and stool morning of 06/16/2020. Follow-up gastroenterology services as needed. Continue Protonix  3/4: hgb 8.6, repeat on Monday.  10. Hypertensionwith new Hypotension. Continue ProAmatine 10 mg 3 times daily- I suggest since BP running [id number] systolic,  Vitals:   62/83/15 1953 06/25/20 0408  BP: (!) 142/92 130/84  Pulse: (!) 110 (!) 104  Resp: 20 20  Temp: 98.8 F (37.1 C) 98.7 F (37.1 C)  SpO2: 93% 96%  reduce proamatine to 5mg  tid 11. Prediabetes. Hemoglobin A1c 6.1. SSI 12. Dysphagia. Dysphagia #2 thin liquid diet. Follow-up speech therapy- pt has Cortrak- is "picky' per husband- and doesn't eat much- explained to pt will need to get out before rehab d/c. 13. Right ankle fracture status post ORIF 03/28/2020. Weightbearing as toleratednow. Follow-up per Dr.  Mardelle Matte 14. Substance abuse. UDS positive for amphetamines. Not prescribed. Outpatient follow-up- also per husband abusing 64 mg Zanaflexdaily prior to admission- doesn't want her to have pain meds or muscle relaxants unless absolutely needed.  15. Recent COVID-19 infection. Tested +1/18. Patient did not require treatment or hospitalization. Outside the window of isolation. 16. Class I obesity. BMI 30.74. Dietary follow-up 17. Bowel and bladder incontinence- new since hospitalization-  18. Chronic nausea- on IV Reglan- will change to PO- is chronic- Reglan is new- might do best to wean as tolerated.Reviewed chart had very extensive GI w/u for similar sx in 2020 which was negative  Nausea increased with TF, recent KUB nl, has some gaseous distension add mylicon, have reduced TF rate, enc po 19. Hypokalemia:   3/4: supplement 74meq and repeat on Monday.  20. Tachycardia: continue to monitor TID 21. Low protein: edited diet order to include high protein foods. Repeat Monday.   LOS: 2 days A FACE TO FACE EVALUATION WAS PERFORMED  Charlett Blake 06/25/2020, 7:33 AM

## 2020-06-25 NOTE — Progress Notes (Addendum)
Patient started gagging/coughing when RN tried to flush her tube this morning with 10 cc of sterile water. RN did not continue flushing. Still c/o of not feeling well on her stomach. Maalox prn given but patient did not like the taste and took 2 sips of maalox. Patient slept for an hour.  Rn restarted Osmolite at 6 am, patient agreed. Patient didn't want rn to leave the room. RN stayed and encouraged patient to drink the apple juice. Per patient she is feeling so-so while the Osmolite is running for about 15 min now.   Patient asleep right now. Osmolite has been infusing for over 50 min. Woke up patient if it's ok to given zofran IV and she agreed. WIll continue to monitor.

## 2020-06-25 NOTE — Progress Notes (Signed)
Patient was nauseated with abdominal discomfort when rn received patient. MD notified regarding patient's condition. MD ordered to decrease rate of Osmolite to 40cc/hr. Also received order for Ambien 5mg . IV zofran given at 2317. Restarted Osmolite at 2330. Patient woke at up 2 am complaining of abdominal pain and feeling of throwing up. RN stopped the tube feeds for now. She also felt like she was going to have a BM. Last BM was 3/4. Patient passed some gas and voided 400cc but did not have a bowel movement. Heat applied to patient's abdomen for comfort.  Patient has also been drinking water this shift. Aproximately 2 cups and some ice chips. Will continue to monitor.

## 2020-06-25 NOTE — Progress Notes (Signed)
Speech Language Pathology Daily Session Note  Patient Details  Name: [name] MRN: [id number] Date of Birth: 06-Aug-1970  Today's Date: 06/25/2020 SLP Individual Time: 1030-1055 SLP Individual Time Calculation (min): 25 min  Short Term Goals: Week 1: SLP Short Term Goal 1 (Week 1): Patient will tolerate Dys 3, thin liquids with minimal instances of coughing/throat clearing and with supervisionA. SLP Short Term Goal 2 (Week 1): Patient will maintain attention to functional basic level tasks for increments of 2-3 minutes with modA. SLP Short Term Goal 3 (Week 1): Patient will respond to open ended questions at phrase level with modA cues to expand/elaborate. SLP Short Term Goal 4 (Week 1): Patient will consume regular texture PO's with minimal difficulty/delay in mastication and minimal instances of cough/throat clear.  Skilled Therapeutic Interventions: Pt seen for skilled ST with focus on dysphagia goals, husband present throughout. Pt rather agitated throughout tx session, minimal acknowledgment/answering SLP but some interaction with husband. Pt c/o of severe nausea all night and this morning, however agreeable to minimal PO trials. HOB raised to 70 degrees before pt protest. Pt consuming 2 oz milk shake via consecutive straw sip with no overt s/s aspiration. Pt agreeable to single bite hashbrown, pt with immediate large coughing/choking/gagging episode. Pt disagreeable to further trials. Spoke with husband and pt about dysphagia goals of ST (encourage safe PO intake and diet advancement as appropriate) with use of trained compensatory strategies. Husband verbalizing understanding, pt with no response to ST. Pt left in bed with alarm set and husband present. Cont ST POC.   Pain Pain Assessment Pain Scale: Faces Pain Score: 0-No pain  Therapy/Group: Individual Therapy  Dewaine Conger 06/25/2020, 10:52 AM

## 2020-06-26 LAB — GLUCOSE, CAPILLARY
Glucose-Capillary: 100 mg/dL — ABNORMAL HIGH (ref 70–99)
Glucose-Capillary: 106 mg/dL — ABNORMAL HIGH (ref 70–99)
Glucose-Capillary: 112 mg/dL — ABNORMAL HIGH (ref 70–99)
Glucose-Capillary: 120 mg/dL — ABNORMAL HIGH (ref 70–99)
Glucose-Capillary: 134 mg/dL — ABNORMAL HIGH (ref 70–99)
Glucose-Capillary: 95 mg/dL (ref 70–99)
Glucose-Capillary: 96 mg/dL (ref 70–99)

## 2020-06-26 MED ORDER — DOCUSATE SODIUM 50 MG/5ML PO LIQD: 100.0000 mg | Freq: Every day | ORAL | Status: DC | PRN

## 2020-06-26 MED ORDER — ONDANSETRON HCL 4 MG PO TABS
8.0000 mg | ORAL_TABLET | Freq: Four times a day (QID) | ORAL | Status: DC | PRN
  Administered 2020-06-26: 8 mg via ORAL
  Filled 2020-06-26 (×2): qty 2

## 2020-06-26 MED ORDER — MIDODRINE HCL 5 MG PO TABS
5.0000 mg | ORAL_TABLET | Freq: Two times a day (BID) | ORAL | Status: DC
  Administered 2020-06-26 – 2020-06-27 (×3): 5 mg via ORAL
  Filled 2020-06-26 (×3): qty 1

## 2020-06-26 MED ORDER — ONDANSETRON HCL 4 MG PO TABS
4.0000 mg | ORAL_TABLET | Freq: Four times a day (QID) | ORAL | Status: DC | PRN
  Filled 2020-06-26: qty 1

## 2020-06-26 MED ORDER — POTASSIUM CHLORIDE 20 MEQ PO PACK: 20.0000 meq | PACK | Freq: Two times a day (BID) | ORAL | Status: DC

## 2020-06-26 MED ORDER — CARVEDILOL 3.125 MG PO TABS
3.1250 mg | ORAL_TABLET | Freq: Two times a day (BID) | ORAL | Status: DC
  Administered 2020-06-26 – 2020-06-29 (×7): 3.125 mg via ORAL
  Filled 2020-06-26 (×7): qty 1

## 2020-06-26 NOTE — Progress Notes (Addendum)
Patient constantly calling claims she is not feeling well; complains of nausea; scheduled meds and prn meds given. Patient wants somebody in her room all the time; Requesting  NT and RN to stay in her room all the time. Patient  Vomited  x1 today.  And Per NT patient said  She can make herself throw up if she wants to.  MD made aware.

## 2020-06-26 NOTE — IPOC Note (Signed)
Overall Plan of Care [facility]) Patient Details Name: [name] MRN: [id number] DOB: Mar 11, 1971  Admitting Diagnosis: <principal problem not specified>  Hospital Problems: Active Problems:   Acute metabolic encephalopathy     Functional Problem List: Nursing Bladder,Bowel,Endurance,Medication Management,Motor,Nutrition,Pain,Perception,Safety  PT Balance,Behavior,Endurance,Motor,Nutrition,Pain,Perception,Safety,Sensory  OT Balance,Behavior,Cognition,Endurance,Motor,Sensory,Safety,Nutrition  SLP Behavior,Cognition,Safety,Linguistic,Nutrition,Perception  TR         Basic ADL's: OT Eating,Grooming,Bathing,Dressing,Toileting     Advanced  ADL's: OT       Transfers: PT Bed Mobility,Bed to Chair,Car  OT Toilet,Tub/Shower     Locomotion: PT Ambulation,Stairs     Additional Impairments: OT None  SLP Swallowing,Communication,Social Cognition comprehension,expression Social Interaction,Problem Solving,Memory,Attention,Awareness  TR      Anticipated Outcomes Item Anticipated Outcome  Self Feeding independent  Swallowing  supervision A   Basic self-care  supervision  Toileting  supervision   Bathroom Transfers supervision  Bowel/Bladder  Reduce incontinence, maintain regular pattern of emptying  Transfers  supervision with LRAD  Locomotion  supervision with LRAD  Communication  minA basic level  Cognition  minA basic level problem solving, supervision for safety  Pain  less than 3  Safety/Judgment  Patient will remain free of falls, skin breakdown, and infection   Therapy Plan: PT Intensity: Minimum of 1-2 x/day ,45 to 90 minutes PT Frequency: 5 out of 7 days PT Duration Estimated Length of Stay: 2 weeks OT Intensity: Minimum of 1-2 x/day, 45 to 90 minutes OT Frequency: 5 out of 7 days OT Duration/Estimated Length of Stay: 12-14 days SLP Intensity: Minumum of 1-2 x/day, 30 to 90 minutes SLP Frequency: 3 to 5 out of 7 days SLP Duration/Estimated Length  of Stay: 2 weeks   Due to the current state of emergency, patients may not be receiving their 3-hours of Medicare-mandated therapy.   Team Interventions: Nursing Interventions Patient/Family Education,Pain Management,Bladder Management,Dysphagia/Aspiration Precaution Training,Discharge Planning,Medication Management,Bowel Management,Psychosocial Support,Disease Management/Prevention,Cognitive Remediation/Compensation  PT interventions Ambulation/gait training,Discharge planning,Functional mobility training,Psychosocial support,Therapeutic Activities,Visual/perceptual remediation/compensation,Wheelchair propulsion/positioning,Therapeutic Exercise,Skin care/wound Administrator, sports training,Neuromuscular re-education,UE/LE Strength taining/ROM,Splinting/orthotics,Pain management,DME/adaptive equipment instruction,Cognitive remediation/compensation,Community reintegration,Functional electrical stimulation,Patient/family education,Stair training,UE/LE Coordination activities  OT Interventions Balance/vestibular training,Cognitive remediation/compensation,Discharge planning,DME/adaptive equipment instruction,Functional mobility training,Pain management,Psychosocial support,Patient/family education,Self Care/advanced ADL retraining,Therapeutic Activities,Therapeutic Exercise,UE/LE Strength taining/ROM,UE/LE Coordination activities  SLP Interventions Cognitive remediation/compensation,Dysphagia/aspiration precaution training,Internal/external aids,Environmental controls,Cueing hierarchy,Functional tasks,Patient/family education,Speech/Language facilitation  TR Interventions    SW/CM Interventions Discharge Planning,Psychosocial Support,Patient/Family Education   Barriers to Discharge MD  Medical stability and cognitive/behavioral issues  Nursing Decreased caregiver support    PT Home environment access/layout,Decreased caregiver support,Insurance for SNF  coverage,Weight,Nutrition means,Behavior    OT      SLP Behavior,Other (comments) (PLOF)    SW Medication compliance     Team Discharge Planning: Destination: PT-Home ,OT- Home , SLP-Home Projected Follow-up: PT-Home health PT, OT-  Home health OT, SLP-Other (comment) (pending progress) Projected Equipment Needs: PT-To be determined, OT- None recommended by OT, SLP-None recommended by SLP Equipment Details: PT-Pt owns a w/c and RW (per husband), OT-family has tub bench and BSC Patient/family involved in discharge planning: PT- Diplomatic Services operational officer,  OT-Patient,Family member/caregiver, SLP-Family member/caregiver  MD ELOS: 10-14d Medical Rehab Prognosis:  Fair Assessment:  50 year old right-handed female with history of depression, opiate use, PTSDwith reported prior suicide attempts maintained on Prozac, Lamictal as well as Risperdal,, hypertension, Covid +1/18, endometriosis, ORIF right ankle 03/28/2020 and is weightbearing as toleratedand has been rather sedentary since her fracture.Per chart review patient lives with spouse and 2 adult aged children. 1 level home 2 steps to entry. Presented 06/08/2020 after reportedly falling out of  the bed at some point during the night and family let her sleep on the floor when they later checked on her she was unresponsive. First responders found her to be pulseless and asystolic. She was intubated with a King airway and received CPR x3 minutes and epi x1 with ROSC. Immediately post ROSC her EKG was concerning for STEMI but subsequent EKG were improved. Cranial CT scan negative. Chemistries revealed potassium 6.5 chloride 89 glucose 180 BUN 214 creatinine 24.91 from baseline 0.83, WBC 16,200 hemoglobin 10.6, troponin 70, urine drug screen positive amphetamines, alcohol negative, lactic acid 4.4, CK 5564, magnesium 2.7hemoglobin A1c 6.1. Renal ultrasound unremarkable. Echocardiogram with ejection fraction of 55 to 60% no wall motion  abnormalities. She remained intubated 2/16-2/20. Started on CRRT and transition to IHD on 06/15/2020 followed by renal services with latest renal function much improved creatinine 2.24-1.81and her hemodialysis is currently on hold as she is showing renal recovery. Chest x-ray suspicious for aspiration pneumonia completing course of Rocephin. Gastroenterology consulted 06/16/2020 for nonspecific lower abdominal pain and some bright red blood per rectum and hemoglobin dipping to 7.1 overnight day.. It was noted patient with extensive GI work-up in 6599 for IDA and cyclic vomiting to include EGD showing non-HPyloric gastritis, colonoscopy benign rectal erythema and VCE 1 small nonbleeding distal AVM. She was transfused 1 unit packed red blood cells gastroenterology services felt no further work-up was needed with close monitoring of hemoglobin and hematocrit. Her hemoglobin has stabilized 8.3 and she was cleared to begin subcutaneous heparin for DVT prophylaxis. Her cardiac status remained stable followed by Dr. Gwenlyn Found of cardiology services. She was maintained on midodrine for blood pressure. Psychiatry consulted for history of depression PTSD with prior suicide attempts and currently remains on Prozac as prior to admission with the addition of Abilify. She is tolerating a dysphagia #2 thin liquid dietand currently receiving nasogastric tube feeds for nutritional support. Therapy evaluations completed due to patient decreased functional ability was admitted for a comprehensive rehab program.    See Team Conference Notes for weekly updates to the plan of care

## 2020-06-26 NOTE — Progress Notes (Addendum)
PROGRESS NOTE   Subjective/Complaints: "I'm ok" Ate poorly~20%, no documented vomiting, BM x1, cont/inc bladder ROS: unable to obtain given cognitive deficits   Objective:   No results found. Recent Labs    06/23/20 1641 06/24/20 0801  WBC 6.5 5.8  HGB 9.1* 8.6*  HCT 28.9* 26.7*  PLT 302 268   Recent Labs    06/23/20 1641 06/24/20 0801  NA  --  137  K  --  3.3*  CL  --  102  CO2  --  22  GLUCOSE  --  162*  BUN  --  17  CREATININE 1.72* 1.47*  CALCIUM  --  8.9   No intake or output data in the 24 hours ending 06/26/20 0710      Physical Exam: Vital Signs Blood pressure (!) 146/92, pulse (!) 110, temperature 99 F (37.2 C), temperature source Oral, resp. rate 20, height 5\' 6"  (1.676 m), weight 82.7 kg, last menstrual period 06/03/2018, SpO2 93 %.  General: No acute distress Mood and affect are appropriate Heart: Regular rate and rhythm no rubs murmurs or extra sounds Lungs: Clear to auscultation, breathing unlabored, no rales or wheezes Abdomen: Positive bowel sounds, soft nontender to palpation, mildly distended, +tympany Extremities: No clubbing, cyanosis, or edema Skin: No evidence of breakdown, no evidence of rash Neurologic:somnolent did not cooperate   Musculoskeletal: Full range of motion in all 4 extremities. No joint swelling    Assessment/Plan: 1. Functional deficits which require 3+ hours per day of interdisciplinary therapy in a comprehensive inpatient rehab setting.  Physiatrist is providing close team supervision and 24 hour management of active medical problems listed below.  Physiatrist and rehab team continue to assess barriers to discharge/monitor patient progress toward functional and medical goals  Care Tool:  Bathing    Body parts bathed by patient: Right arm,Left arm,Chest,Abdomen,Face,Left upper leg,Right upper leg,Right lower leg,Left lower leg   Body parts bathed by  helper: Front perineal area,Buttocks     Bathing assist Assist Level: Minimal Assistance - Patient > 75%     Upper Body Dressing/Undressing Upper body dressing   What is the patient wearing?: Hospital gown only    Upper body assist Assist Level: Minimal Assistance - Patient > 75%    Lower Body Dressing/Undressing Lower body dressing      What is the patient wearing?: Incontinence brief     Lower body assist Assist for lower body dressing: Moderate Assistance - Patient 50 - 74%     Toileting Toileting    Toileting assist Assist for toileting: 2 Helpers     Transfers Chair/bed transfer  Transfers assist     Chair/bed transfer assist level: Minimal Assistance - Patient > 75%     Locomotion Ambulation   Ambulation assist      Assist level: Minimal Assistance - Patient > 75% Assistive device: Walker-rolling Max distance: 50'   Walk 10 feet activity   Assist     Assist level: Minimal Assistance - Patient > 75% Assistive device: Walker-rolling   Walk 50 feet activity   Assist    Assist level: Minimal Assistance - Patient > 75% Assistive device: Walker-rolling    Walk 150  feet activity   Assist Walk 150 feet activity did not occur: Safety/medical concerns         Walk 10 feet on uneven surface  activity   Assist Walk 10 feet on uneven surfaces activity did not occur: Safety/medical concerns         Wheelchair     Assist Will patient use wheelchair at discharge?: No             Wheelchair 50 feet with 2 turns activity    Assist            Wheelchair 150 feet activity     Assist          Blood pressure (!) 146/92, pulse (!) 110, temperature 99 F (37.2 C), temperature source Oral, resp. rate 20, height 5\' 6"  (1.676 m), weight 82.7 kg, last menstrual period 06/03/2018, SpO2 93 %.  Medical Problem List and Plan: 1.Decreased functional mobilitysecondary to acute hypoxic respiratory failure with  compromised airway in the setting of cardiac arrest/CPR and aspiration pneumonitis. Extubated 2/20- with likely anoxic brain injury due to 3 minutes minimum of asystole-patient may shower -ELOS/Goals: goals Supervision to min A- 10-12 days?  CIR PT,OT, SLP  2. Antithrombotics: -DVT/anticoagulation: Continue Subcutaneous heparin -antiplatelet therapy: N/A 3. Pain Management:Tylenol as needed- NO ZANAFLEX or other muscle relaxants- pt has specific recent hx of addiction to Zanaflex. 4. Mood/PTSD:Prozac 40 mg daily. Follow-up psychiatry services as needed -antipsychotic agents: Abilify 5 mg daily 5. Neuropsych: This patientis?capable of making decisions onherown behalf. 6. Skin/Wound Care:Routine skin checks 7. Fluids/Electrolytes/Nutrition:Routine in and outs with follow-up chemistries 8. AKI secondary to ischemic ATN following cardiac arrest. CRRT 06/08/2020 transition to hemodialysis 06/15/2020 currently on hold latest creatinine1.81. Follow-up hemodialysis as needed- when possible, suggest switching to Lovenox? 9. Anemia of critical illness. Small hematochezia and stool morning of 06/16/2020. Follow-up gastroenterology services as needed. Continue Protonix  3/4: hgb 8.6, repeat on Monday.  10. Hypertensionwith new Hypotension. Continue ProAmatine 10 mg 3 times daily- I suggest since BP running [id number] systolic,  Vitals:   26/37/85 1933 06/26/20 0409  BP: (!) 149/80 (!) 146/92  Pulse: (!) 107 (!) 110  Resp: 20 20  Temp: 97.7 F (36.5 C) 99 F (37.2 C)  SpO2: 100% 93%  reduce proamatine to 5mg  bid, add low dose coreg- may be able to stop proamatine next wk 11. Prediabetes. Hemoglobin A1c 6.1. SSI 12. Dysphagia. Dysphagia #2 thin liquid diet. Follow-up speech therapy- pt has Cortrak- is "picky' per husband- and doesn't eat much- explained to pt will need to get out before rehab d/c. 13. Right ankle fracture status post ORIF 03/28/2020.  Weightbearing as toleratednow. Follow-up per Dr. Mardelle Matte 14. Substance abuse. UDS positive for amphetamines. Not prescribed. Outpatient follow-up- also per husband abusing 64 mg Zanaflexdaily prior to admission- doesn't want her to have pain meds or muscle relaxants unless absolutely needed.  15. Recent COVID-19 infection. Tested +1/18. Patient did not require treatment or hospitalization. Outside the window of isolation. 16. Class I obesity. BMI 30.74. Dietary follow-up 17. Bowel and bladder incontinence- new since hospitalization-  18. Chronic nausea- on IV Reglan- will change to PO- is chronic- Reglan is new- might do best to wean as tolerated.Reviewed chart had very extensive GI w/u for similar sx in 2020 which was negative  Nausea increased with TF, recent KUB nl, has some gaseous distension add mylicon, have reduced TF rate, enc po  3/6 still c/o nausea, abd exam benign, increase Zofran to 8mg  19. Hypokalemia:  3/4: supplement 60meq and repeat on Monday.  20. Tachycardia: continue to monitor TID 21. Low protein: edited diet order to include high protein foods. Repeat Monday.   LOS: 3 days A FACE TO [place] E Abishai Vie[place] 06/26/2020, 7:10 AM

## 2020-06-26 NOTE — Progress Notes (Signed)
Pt with hx of chronic nausea, has had GI w/u for this not obvious cause.  Pt requesting meds for this despite receiving Reglan, 4mg  Zofran, Mylanta,and mylicon.  No vomiting.  Pt with PMH of "addiction" to tizanidine.  Abd exam uremarkable, recent KUB normal.  EKG with mildly prolong QTc 447ms.  Would hold off on increased zofran.  Given lathargy associated with her encphalopathy would not rec more  Sedating anti nausea meds such as compazine or phenergan unless pt is vomiting. Have reduced TF rate.Will ask dietarty to reassess TF formula but otherwise we will  cont current rx

## 2020-06-27 DIAGNOSIS — D638 Anemia in other chronic diseases classified elsewhere: Secondary | ICD-10-CM

## 2020-06-27 DIAGNOSIS — R000 Tachycardia, unspecified: Secondary | ICD-10-CM

## 2020-06-27 DIAGNOSIS — F1910 Other psychoactive substance abuse, uncomplicated: Secondary | ICD-10-CM

## 2020-06-27 DIAGNOSIS — I10 Essential (primary) hypertension: Secondary | ICD-10-CM

## 2020-06-27 DIAGNOSIS — N179 Acute kidney failure, unspecified: Secondary | ICD-10-CM

## 2020-06-27 LAB — COMPREHENSIVE METABOLIC PANEL
ALT: 19 U/L (ref 0–44)
AST: 18 U/L (ref 15–41)
Albumin: 2.6 g/dL — ABNORMAL LOW (ref 3.5–5.0)
Alkaline Phosphatase: 91 U/L (ref 38–126)
Anion gap: 12 (ref 5–15)
BUN: 8 mg/dL (ref 6–20)
CO2: 23 mmol/L (ref 22–32)
Calcium: 8.9 mg/dL (ref 8.9–10.3)
Chloride: 103 mmol/L (ref 98–111)
Creatinine, Ser: 1.16 mg/dL — ABNORMAL HIGH (ref 0.44–1.00)
GFR, Estimated: 58 mL/min — ABNORMAL LOW (ref >60)
Glucose, Bld: 129 mg/dL — ABNORMAL HIGH (ref 70–99)
Potassium: 3.1 mmol/L — ABNORMAL LOW (ref 3.5–5.1)
Sodium: 138 mmol/L (ref 135–145)
Total Bilirubin: 0.6 mg/dL (ref 0.3–1.2)
Total Protein: 6.5 g/dL (ref 6.5–8.1)

## 2020-06-27 LAB — CBC
HCT: 27.2 % — ABNORMAL LOW (ref 36.0–46.0)
Hemoglobin: 8.6 g/dL — ABNORMAL LOW (ref 12.0–15.0)
MCH: 30.4 pg (ref 26.0–34.0)
MCHC: 31.6 g/dL (ref 30.0–36.0)
MCV: 96.1 fL (ref 80.0–100.0)
Platelets: 349 10*3/uL (ref 150–400)
RBC: 2.83 MIL/uL — ABNORMAL LOW (ref 3.87–5.11)
RDW: 14.1 % (ref 11.5–15.5)
WBC: 7.7 10*3/uL (ref 4.0–10.5)
nRBC: 0 % (ref 0.0–0.2)

## 2020-06-27 LAB — GLUCOSE, CAPILLARY
Glucose-Capillary: 121 mg/dL — ABNORMAL HIGH (ref 70–99)
Glucose-Capillary: 166 mg/dL — ABNORMAL HIGH (ref 70–99)
Glucose-Capillary: 97 mg/dL (ref 70–99)
Glucose-Capillary: 89 mg/dL (ref 70–99)
Glucose-Capillary: 126 mg/dL — ABNORMAL HIGH (ref 70–99)

## 2020-06-27 MED ORDER — POTASSIUM CHLORIDE CRYS ER 20 MEQ PO TBCR
30.0000 meq | EXTENDED_RELEASE_TABLET | Freq: Every day | ORAL | Status: DC
  Administered 2020-06-27 – 2020-06-29 (×3): 30 meq via ORAL
  Filled 2020-06-27 (×4): qty 1

## 2020-06-27 MED ORDER — VITAL 1.5 CAL PO LIQD
800.0000 mL | ORAL | Status: DC
  Administered 2020-06-27 – 2020-06-28 (×2): 800 mL
  Filled 2020-06-27 (×2): qty 948

## 2020-06-27 MED ORDER — ONDANSETRON HCL 4 MG PO TABS
8.0000 mg | ORAL_TABLET | Freq: Four times a day (QID) | ORAL | Status: DC | PRN
  Filled 2020-06-27: qty 2

## 2020-06-27 MED ORDER — SODIUM CHLORIDE 0.9 % IV SOLN
8.0000 mg | Freq: Four times a day (QID) | INTRAVENOUS | Status: DC | PRN
  Filled 2020-06-27: qty 4

## 2020-06-27 MED ORDER — ONDANSETRON HCL 4 MG PO TABS
4.0000 mg | ORAL_TABLET | Freq: Four times a day (QID) | ORAL | Status: DC | PRN
  Administered 2020-06-27 – 2020-07-01 (×6): 4 mg via ORAL
  Filled 2020-06-27 (×7): qty 1

## 2020-06-27 NOTE — Progress Notes (Signed)
Pt continues to refuse meals. Lunch tray refused by pt. Left tray at bedside.  Sheela Stack, LPN

## 2020-06-27 NOTE — Progress Notes (Signed)
Occupational Therapy Session Note  Patient Details  Name: [name] MRN: [id number] Date of Birth: 1970-05-05  Today's Date: 06/27/2020 OT Individual Time: 0830-0900 OT Individual Time Calculation (min): 30 min    Short Term Goals: Week 1:  OT Short Term Goal 1 (Week 1): Pt will be able to ambulate in and out of bathroom with min A with LRAD. OT Short Term Goal 2 (Week 1): Pt will be able to bathe self wtih CGA for balance. OT Short Term Goal 3 (Week 1): Pt will don pants with min A or less. OT Short Term Goal 4 (Week 1): Pt will be able to sit to stand with S to increase independence with LB self care. OT Short Term Goal 5 (Week 1): Pt will be able to toilet with min A.  Skilled Therapeutic Interventions/Progress Updates:    Pt received in bed with spouse present. Pt c/o stomach pain and nausea, nursing aware.   Encouraged pt to sit up and get dressed to try to move a little to work towards her goals of getting home.  Pt did sit to EOB with S and donned shirt and pants with min cues for orientation and problem solving. Able to don her own socks and L shoe. Assist with R shoe due to old ankle fracture with pain.    Asked pt several times during session if she needed to toilet, pt declined and spouse said staff just changed her.    Pt ambulated with RW to sink with close Supervision to stand at sink to comb hair. Did not want to do oral care. Pt sat back down on bed and said she was dizzy but it passed with resting.  She ambulated out to hallway for 10 feet with RW with supervision, but got dizzy as she turned around and then needed min A to walk back to bed.  Pt sat in bed and with cues moved herself up in bed.  Pt tends to get frustrated easily.  Pt in bed with bed alarm set.   Therapy Documentation Precautions:  Precautions Precautions: Fall Precaution Comments: cortrak Restrictions Weight Bearing Restrictions: No RLE Weight Bearing: Weight bearing as tolerated  Pain: Pain  Assessment Pain Scale: 0-10 Pain Score: 0-No pain ADL: ADL Grooming: Minimal cueing,Minimal assistance Where Assessed-Grooming: Sitting at sink Upper Body Bathing: Minimal cueing,Supervision/safety Where Assessed-Upper Body Bathing: Sitting at sink Lower Body Bathing: Minimal cueing,Moderate assistance Where Assessed-Lower Body Bathing: Sitting at sink,Standing at sink Upper Body Dressing: Minimal assistance,Minimal cueing Where Assessed-Upper Body Dressing: Wheelchair Lower Body Dressing: Moderate assistance,Minimal cueing Where Assessed-Lower Body Dressing: Wheelchair Toileting: Moderate cueing,Moderate assistance Where Assessed-Toileting: Glass blower/designer: Moderate assistance Toilet Transfer Method: Squat pivot Toilet Transfer Equipment: Grab bars   Therapy/Group: Individual Therapy  [place] 06/27/2020, 8:28 AM

## 2020-06-27 NOTE — Progress Notes (Signed)
Nutrition Follow-up  DOCUMENTATION CODES:   Not applicable  INTERVENTION:   Continue intermittent/nocturnal tube feeds via Cortrak: - Change to Vital 1.5 @ 50 ml/hr to run over 16 hours from 1600 to 0800 (total of 800 ml) - Free water flushes of 50 ml q 6 hours to maintain tube patency  Intermittent/nocturnal tube feeding regimen provides 1200 kcal, 54 grams of protein, and 611 ml of H2O (meets 67% of kcal needs and 60% of protein needs).  Total free water with flushes: 811 ml  - Continue Magic Cup TID with meals, each supplement provides 290 kcal and 9 grams of protein  - Continue Vital Cuisine Shake TID with meals, each supplement provides 520 kcal and 22 grams of protein  - Continue renal MVI daily  NUTRITION DIAGNOSIS:   Inadequate oral intake related to nausea,poor appetite as evidenced by meal completion < 25%.  Ongoing  GOAL:   Patient will meet greater than or equal to 90% of their needs  Progressing, being addressed via TF  MONITOR:   PO intake,Supplement acceptance,Labs,Weight trends,TF tolerance  REASON FOR ASSESSMENT:   Malnutrition Screening Tool,Consult Enteral/tube feeding initiation and management  ASSESSMENT:   50 year old female with PMH of depression, opiate use, PTSD with reported prior suicide attempts, HTN, COVID positive on 05/10/20, endometriosis, ORIF right ankle 03/28/20. Presented 06/08/20 after cardiac arrest. Pt was intubated 2/16-2/20 and started on CRRT then transitioned to iHD on 06/15/20. Hemodialysis currently on hold due to signs of renal recovery. Pt is on a dysphagia 2 diet with thin liquids but PO intake is poor. Cortrak in place for nutrition support. Admitted to CIR on 3/03.  RD consulted to change tube feeding regimen in an attempt to help improve pt's nausea. Per notes, pt's nausea is a chronic problem which has been worked up by GI with no obvious cause. Pt on multiple medications including reglan, zofran, mylanta, mylicon. Per  MD, abdominal exam unremarkable. Recent KUB is normal.  Discussed pt with RN. RN reports pt with continued nausea despite multiple medications ordered. Pt actively dry-heaving/coughing at time of RD visit and appeared physically uncomfortable in bed. Per RN, pt taking some medications via PO route. RN unsure how pt ate for breakfast this morning. Per flowsheets, pt consumed 0% at breakfast. Noted pt's husband has been bringing in food in an attempt to help improve pt's PO intake.  RD will change tube feeding formula to Vital 1.5 which is a hydrolyzed, peptide-based formula to promote tolerance.  Noted tube feeding was paused several times over the weekend due to nausea and rate turned down to 40 ml/hr. Pt has not been meeting nutritional needs since admission to CIR and is at high risk for malnutrition.  Discussed the above concerns with MD via secure chat.  Weight stable at this time. CIR admit weight 82.5 kg and current weight is 83.4 kg.  Current TF: Osmolite 1.5 @ 55 ml/hr x 16 hours, free water flushes of 50 ml q 6 hours  Meal Completion: 0-60% (averaging 10%)  Medications reviewed and include: prozac, SSI q 4 hours, reglan 10 mg TID, rena-vit, protonix, klor-con 30 mEq daily, simethicone 20 mg QID  Labs reviewed: potassium 3.1, hemoglobin 8.6 CBG's: 95-166 x 24 hours  Diet Order:   Diet Order            DIET DYS 3 Room service appropriate? Yes; Fluid consistency: Thin  Diet effective now  EDUCATION NEEDS:   Not appropriate for education at this time  Skin:  Skin Assessment: Reviewed RN Assessment  Last BM:  06/27/20  Height:   Ht Readings from Last 1 Encounters:  06/23/20 5\' 6"  (1.676 m)    Weight:   Wt Readings from Last 1 Encounters:  06/27/20 83.4 kg    BMI:  Body mass index is 29.68 kg/m.  Estimated Nutritional Needs:   Kcal:  1800-2000  Protein:  90-110 grams  Fluid:  1.8-2.0 L    Gustavus Bryant, MS, RD, LDN Inpatient Clinical  Dietitian Please see AMiON for contact information.

## 2020-06-27 NOTE — Progress Notes (Signed)
Physical Therapy Session Note  Patient Details  Name: [name] MRN: [id number] Date of Birth: 31-May-1970  Today's Date: 06/27/2020 PT Individual Time: 3007-6226 and 1300-1345 PT Individual Time Calculation (min): 60 min and 45 min  Short Term Goals: Week 1:  PT Short Term Goal 1 (Week 1): Pt will perform bed mobility with CGA and no bed features PT Short Term Goal 2 (Week 1): Pt will perform bed<>chair transfers with CGA and LRAD PT Short Term Goal 3 (Week 1): Pt will ambulate 129ft with CGA and LRAD PT Short Term Goal 4 (Week 1): Pt will initiate stair training Week 2:    Week 3:     Skilled Therapeutic Interventions/Progress Updates:   am session Pain:  Pt reports no pain.  Treatment to tolerance.  Rest breaks and repositioning as needed.  Pt initially supine and not verbalizing in response to several questions posed by therapist.  Indicated agreeable to treatment session by coming to sit at edge of bed when therapist requested.  treatment session w/focus on endurance, gait, balance, cognition.  Pt supine to sit w/min assist.  Sit to stand w/mod assist from bed and short distance gait/transfer to wc w/RW and min assist and max cues for safety.  wc propulsion x 45ft wbilat LEs and min to mod assist, cues.  Sit to stand w/mod assist, gait 70 Ft  X 2 w/min assist, cues for safe hand positioning on walker. Seated rest between efforts for recovery, dspnea w/exertion but recovers steadily w/rest HR 100 -  108, 02 sats 97%.  Pt w/first verbalization of session states "Im done".  Functional Gait: 75ft weaving thru cones placed in zig zag pattern every 67ft.  Pt w/poor ability to navigate thru cones, initially strikes cones only minimally w/turning but w/fatigue basically plows over cones w/poor attention to task.  Poor safety awareness esp w/turning to sit in wc.  Pt states "I am done, take me back".  Pt rested as therapist tied shoes and set up w/block task. Pt able to perform  simple 8 pc/2 color puzzle w/min assist. Pt repeats "I am done" "take me back" several times during session.  Sit to stand and gait x 67ft w/min assist, cues to attend to L, hand position on walker, veers to L. Turn/sit to edge of bed w/cues for safety, min assit.  Attempts to lie down even though she is seated at foot of bed.  Scoots to head of bead w/max encouragement, cga.  Sit to supine w/supervison.  Pt repositons self mult times w/supervision.     Pt then yells at therapist "I just walked, I AM NOT WALKING AGAIN."   Calmly discussed options and pt nodded agreement to bed level therex.   Pt performed R sidelying clamshells and heelslides w/LLE, Bridging x 10, and L LLE heel slides x 4  Pt then became further agitated and yelled "You people are something else.  You just don't stop!".   Pt reminded she is in inpt rehab and will receive 3 hrs therapy per day to prepare her for DC home.   Pt states "Please leave now."  Pt missed 15 min skilled therapy due to limited cooperation/refusal/agitation. Pt left supine w/rails up x 4, alarm set, bed in lowest position, and needs in reach.  Nurse at bedside.    PM SESSION Pain:  Pt reports no pain.  Treatment to tolerance.  Rest breaks and repositioning as needed.  Pt initially supine and initially refuses "I don't want to do  anything today...wait., I already saw you today." Reviewed importance of increasing activity, goals of IPR. Pt supine to sit w/supervision.  Total assist to don shoes/pt refuses to assist. Maintains balance w/supervision.   stand pivot transfer to wc w/RW and min assist  Once in wc, pt transported to gym for continued session. Did not verbalize outside of stating she was cold and refusing blanket offered by therapist.  Sit to stand w/min assist and cues for hand placement. Gait 76ft x 1, 58ft x 1, 51ft x 1 w/min assist, cues to attend to obstacles on L, max cues for safety w/Sit to stand and stand to sit  w/walker.  Dyspnea w/exertion, RR peaks at 32/min but steadily decreases to resting rate of 24 w/seated rest Pt not receptive to instructional breathing.  Pt w/short shallow breathing.   "I am ready to go!" At end of session, pt transported back to room.   stand pivot transfer wc to bed w/min assist.     Therapy Documentation Precautions:  Precautions Precautions: Fall Precaution Comments: cortrak Restrictions Weight Bearing Restrictions: No RLE Weight Bearing: Weight bearing as tolerated    Therapy/Group: Individual Therapy  Callie Fielding, [place] 06/27/2020, 12:30 PM

## 2020-06-27 NOTE — Progress Notes (Signed)
Speech Language Pathology Daily Session Note  Patient Details  Name: [name] MRN: [id number] Date of Birth: 14-May-1970  Today's Date: 06/27/2020 SLP Individual Time: 1345-1400 SLP Individual Time Calculation (min): 15 min  Short Term Goals: Week 1: SLP Short Term Goal 1 (Week 1): Patient will tolerate Dys 3, thin liquids with minimal instances of coughing/throat clearing and with supervisionA. SLP Short Term Goal 2 (Week 1): Patient will maintain attention to functional basic level tasks for increments of 2-3 minutes with modA. SLP Short Term Goal 3 (Week 1): Patient will respond to open ended questions at phrase level with modA cues to expand/elaborate. SLP Short Term Goal 4 (Week 1): Patient will consume regular texture PO's with minimal difficulty/delay in mastication and minimal instances of cough/throat clear.   Skilled Therapeutic Interventions: Pt was seen for skilled ST targeting dysphagia education goals. SLP offered several regular textured PO items to pt in effort to engage her in advanced trial to assess potential for solid advancement. However, pt adamantly refused any PO intake due to nausea. Education provided regarding importance of her participation in therapies in CIR, as well as importance of skilled observation of her intake of regular textures to assess safety of upgrade. Also emphasized that this would liberalize diet, providing her with more options to have preferred food items. She acknowledged this but was minimally responsive and still declined trials today. She also refused to participate in other activities, and stated "you better just leave me alone." Therefore, pt missed last 15 mins of skilled ST session. She was left laying in bed with alarm set and needs within reach. Continue per current plan of care.        Pain Pain Assessment Pain Scale: 0-10 Pain Score: 0-No pain (nausea)  Therapy/Group: Individual Therapy  Arbutus Leas 06/27/2020, 7:20 AM

## 2020-06-27 NOTE — Progress Notes (Signed)
PROGRESS NOTE   Subjective/Complaints: Patient seen laying in bed this AM.  She states she slept fairly overnight.  She notes nausea with limited benefit with zofran.  Chart reviewed, appears to be chronic.  She notes BMs.  Significant other with questions regarding diet.    ROS: +Nausea.  Appears to deny CP, SOB, diarrhea.   Objective:   No results found. Recent Labs    06/27/20 0550  WBC 7.7  HGB 8.6*  HCT 27.2*  PLT 349   Recent Labs    06/27/20 0550  NA 138  K 3.1*  CL 103  CO2 23  GLUCOSE 129*  BUN 8  CREATININE 1.16*  CALCIUM 8.9    Intake/Output Summary (Last 24 hours) at 06/27/2020 0951 Last data filed at 06/26/2020 1900 Gross per 24 hour  Intake --  Output 401 ml  Net -401 ml        Physical Exam: Vital Signs Blood pressure 130/84, pulse (!) 108, temperature (!) 97.5 F (36.4 C), resp. rate 20, height 5\' 6"  (1.676 m), weight 83.4 kg, last menstrual period 06/03/2018, SpO2 90 %. Constitutional: No distress . Vital signs reviewed. HENT: Normocephalic.  Atraumatic. + NG. Eyes: EOMI. No discharge. Cardiovascular: No JVD.  +Tachycardia.  Regular rhythm.  Respiratory: Normal effort.  No stridor.  Bilateral clear to auscultation. GI: Non-distended.  BS+.  Skin: Warm and dry.  Intact. Psych: Flat. Some what anxious. Musc: No edema in extremities.  No tenderness in extremities. Neurologic: Alert and oriented x3. Motor: Appears to be 4/5 throughout  Assessment/Plan: 1. Functional deficits which require 3+ hours per day of interdisciplinary therapy in a comprehensive inpatient rehab setting.  Physiatrist is providing close team supervision and 24 hour management of active medical problems listed below.  Physiatrist and rehab team continue to assess barriers to discharge/monitor patient progress toward functional and medical goals  Care Tool:  Bathing    Body parts bathed by patient: Right arm,Left  arm,Chest,Abdomen,Face,Left upper leg,Right upper leg,Right lower leg,Left lower leg   Body parts bathed by helper: Front perineal area,Buttocks     Bathing assist Assist Level: Minimal Assistance - Patient > 75%     Upper Body Dressing/Undressing Upper body dressing   What is the patient wearing?: Hospital gown only    Upper body assist Assist Level: Minimal Assistance - Patient > 75%    Lower Body Dressing/Undressing Lower body dressing      What is the patient wearing?: Incontinence brief     Lower body assist Assist for lower body dressing: Moderate Assistance - Patient 50 - 74%     Toileting Toileting    Toileting assist Assist for toileting: 2 Helpers     Transfers Chair/bed transfer  Transfers assist     Chair/bed transfer assist level: Minimal Assistance - Patient > 75%     Locomotion Ambulation   Ambulation assist      Assist level: Minimal Assistance - Patient > 75% Assistive device: Walker-rolling Max distance: 50'   Walk 10 feet activity   Assist     Assist level: Minimal Assistance - Patient > 75% Assistive device: Walker-rolling   Walk 50 feet activity   Assist  Assist level: Minimal Assistance - Patient > 75% Assistive device: Walker-rolling    Walk 150 feet activity   Assist Walk 150 feet activity did not occur: Safety/medical concerns         Walk 10 feet on uneven surface  activity   Assist Walk 10 feet on uneven surfaces activity did not occur: Safety/medical concerns         Wheelchair     Assist Will patient use wheelchair at discharge?: No             Wheelchair 50 feet with 2 turns activity    Assist            Wheelchair 150 feet activity     Assist          Medical Problem List and Plan: 1.Decreased functional mobilitysecondary to acute hypoxic respiratory failure with compromised airway in the setting of cardiac arrest/CPR and aspiration pneumonitis. Extubated  2/20- with likely anoxic brain injury due to 3 minutes minimum of asystole  Continue CIR 2. Antithrombotics: -DVT/anticoagulation: Continue Subcutaneous heparin -antiplatelet therapy: N/A 3. Pain Management:Tylenol as needed-  NO ZANAFLEX or other muscle relaxants- pt has specific recent hx of addiction to Zanaflex. 4. Mood/PTSD:Prozac 40 mg daily. Follow-up psychiatry services as needed -antipsychotic agents: Abilify 5 mg daily 5. Neuropsych: This patientis?capable of making decisions onherown behalf. 6. Skin/Wound Care:Routine skin checks 7. Fluids/Electrolytes/Nutrition:Routine in and outs 8. AKI secondary to ischemic ATN following cardiac arrest. CRRT 06/08/2020 transition to hemodialysis 06/15/2020 currently on hold   Follow-up hemodialysis as needed  Cr. 1.16 on 3/7  Continue to monitor 9. Anemia of critical illness. Small hematochezia and stool morning of 06/16/2020. Follow-up gastroenterology services as needed. Continue Protonix  Hb 8.6 on 3/7  Continue to monitor 10. Hypertensionwith new Hypotension.  Vitals:   06/26/20 1943 06/27/20 0350  BP: (!) 150/97 130/84  Pulse: 93 (!) 108  Resp: 19 20  Temp: 97.7 F (36.5 C) (!) 97.5 F (36.4 C)  SpO2: 95% 90%   Decreased proamatine to 5mg  bid, d/ced on 3/7  Added low dose coreg 11. Prediabetes. Hemoglobin A1c 6.1. SSI 12. Dysphagia. Dysphagia #3 thin liquid diet.   Advance diet as tolerated  Cont NG with tube feeds 13. Right ankle fracture status post ORIF 03/28/2020. Weightbearing as toleratednow. Follow-up per Dr. Mardelle Matte 14. Substance abuse. UDS positive for amphetamines. Not prescribed. Outpatient follow-up- also per husband abusing 64 mg Zanaflexdaily prior to [place]  Husband would like to avoid pain meds/muscle relaxers 15. Recent COVID-19 infection. Tested +1/18. Patient did not require treatment or hospitalization. Outside the window of isolation. 16. Class I obesity. BMI  30.74. Dietary follow-up 17. Bowel and bladder incontinence- new since hospitalization-  18. Chronic nausea- Reglan changed to PO  Very extensive GI w/u for similar sx in 2020 which was negative  Nausea increased with   Added mylicon, reduced TF rate, enc po 19. Hypokalemia:   K+ 3.1 on 3/7  Daily supplement initiated  20. Tachycardia: continue to monitor   Cont Coreg, increase as tolerated 21. Low protein: edited diet order to include high protein foods.    LOS: 4 days A FACE TO FACE EVALUATION WAS PERFORMED  Nayah Lukens Lorie Phenix 06/27/2020, 9:51 AM

## 2020-06-27 NOTE — Progress Notes (Signed)
Patient called for c/o abdominal pain.Maalox given with minimal relief. Slept from 11 pm till 3 am with Ambien 10 mg qhs prn given. Patient calls frequently for abdominal discomfort and also for company. RN tried to stay with the patient most of the time when she is awake. Offered strawberry icy which she ate a little  and drank some apple juice.

## 2020-06-28 ENCOUNTER — Inpatient Hospital Stay (HOSPITAL_COMMUNITY): Payer: Medicare Other

## 2020-06-28 DIAGNOSIS — R112 Nausea with vomiting, unspecified: Secondary | ICD-10-CM

## 2020-06-28 DIAGNOSIS — E876 Hypokalemia: Secondary | ICD-10-CM

## 2020-06-28 LAB — GLUCOSE, CAPILLARY
Glucose-Capillary: 109 mg/dL — ABNORMAL HIGH (ref 70–99)
Glucose-Capillary: 109 mg/dL — ABNORMAL HIGH (ref 70–99)
Glucose-Capillary: 121 mg/dL — ABNORMAL HIGH (ref 70–99)
Glucose-Capillary: 134 mg/dL — ABNORMAL HIGH (ref 70–99)
Glucose-Capillary: 144 mg/dL — ABNORMAL HIGH (ref 70–99)
Glucose-Capillary: 157 mg/dL — ABNORMAL HIGH (ref 70–99)

## 2020-06-28 MED ORDER — PANTOPRAZOLE SODIUM 40 MG PO TBEC
40.0000 mg | DELAYED_RELEASE_TABLET | Freq: Every day | ORAL | Status: DC
  Administered 2020-06-28 – 2020-07-01 (×4): 40 mg via ORAL
  Filled 2020-06-28 (×4): qty 1

## 2020-06-28 MED ORDER — PRAZOSIN HCL 1 MG PO CAPS
1.0000 mg | ORAL_CAPSULE | Freq: Every day | ORAL | Status: DC
  Administered 2020-06-28 – 2020-06-29 (×2): 1 mg via ORAL
  Filled 2020-06-28 (×2): qty 1

## 2020-06-28 MED ORDER — MELATONIN 3 MG PO TABS
1.5000 mg | ORAL_TABLET | Freq: Every day | ORAL | Status: DC
  Administered 2020-06-28 – 2020-07-01 (×4): 1.5 mg via ORAL
  Filled 2020-06-28 (×4): qty 1

## 2020-06-28 NOTE — Progress Notes (Signed)
Occupational Therapy Session Note  Patient Details  Name: [name] MRN: [id number] Date of Birth: 25-Nov-1970  Today's Date: 06/28/2020 OT Individual Time: 0930-1030 OT Individual Time Calculation (min): 60 min    Short Term Goals: Week 1:  OT Short Term Goal 1 (Week 1): Pt will be able to ambulate in and out of bathroom with min A with LRAD. OT Short Term Goal 2 (Week 1): Pt will be able to bathe self wtih CGA for balance. OT Short Term Goal 3 (Week 1): Pt will don pants with min A or less. OT Short Term Goal 4 (Week 1): Pt will be able to sit to stand with S to increase independence with LB self care. OT Short Term Goal 5 (Week 1): Pt will be able to toilet with min A.  Skilled Therapeutic Interventions/Progress Updates:    Pt received in wc and agreeable to a shower.  See ADL documentation below for details. Pt did want to toilet first and she was able to have a BM.  Used wc for stand pivots to toilet and shower versus walking as pt stated her R ankle was hurting (old ankle fx). Pt was able to do the majority of her self care BUT she needs a great deal of encouragement and cues as she automatically will say "help" without even trying first.  Will also need problem solving cues, with things such as how to get her pants over feet when the hem gets stuck on her toes.   Pt very tired at end of session and requested to get back in bed.  CGA with transfer and then with cues pt adjusted herself in bed.   Alarm set and all needs met.    Therapy Documentation Precautions:  Precautions Precautions: Fall Precaution Comments: cortrak Restrictions Weight Bearing Restrictions: No RLE Weight Bearing: Weight bearing as tolerated   Vital Signs: Therapy Vitals Temp: 97.6 F (36.4 C) Temp Source: Oral Pulse Rate: 97 Resp: 19 BP: (!) 149/93 Patient Position (if appropriate): Sitting Oxygen Therapy SpO2: 95 % O2 Device: Room Air Pain: Pain Assessment Faces Pain Scale: No  hurt ADL: ADL Grooming: Minimal cueing,Minimal assistance Where Assessed-Grooming: Sitting at sink Upper Body Bathing: Minimal cueing,Supervision/safety Where Assessed-Upper Body Bathing: Shower Lower Body Bathing: Minimal cueing,Contact guard Where Assessed-Lower Body Bathing: Shower Upper Body Dressing: Supervision/safety Where Assessed-Upper Body Dressing: Wheelchair Lower Body Dressing: Minimal cueing,Contact guard Where Assessed-Lower Body Dressing: Wheelchair Toileting: Moderate cueing,Minimal assistance Where Assessed-Toileting: Glass blower/designer: Psychiatric nurse Method: Arts development officer: Grab bars,Raised Counselling psychologist: Environmental education officer Method: Radiographer, therapeutic: Transfer tub bench,Grab bars  Therapy/Group: Individual Therapy  [place] 06/28/2020, 12:18 PM

## 2020-06-28 NOTE — Progress Notes (Signed)
Pt up at 04:00 c/o nausea. Advised that can give no more anti-nausea meds until 0600.Tried ginger ale, heating pad and deep breathing technique; pt states nothing works. Pt repeatedly called nurse station to c/o pain and request staff to stay in room with her.  Gave Pt prn Zofran @ ordered time.

## 2020-06-28 NOTE — Progress Notes (Signed)
Speech Language Pathology Daily Session Note  Patient Details  Name: [name] MRN: [id number] Date of Birth: Mar 16, 1971  Today's Date: 06/28/2020 SLP Individual Time: 0600-4599 SLP Individual Time Calculation (min): 16 min  Short Term Goals: Week 1: SLP Short Term Goal 1 (Week 1): Patient will tolerate Dys 3, thin liquids with minimal instances of coughing/throat clearing and with supervisionA. SLP Short Term Goal 2 (Week 1): Patient will maintain attention to functional basic level tasks for increments of 2-3 minutes with modA. SLP Short Term Goal 3 (Week 1): Patient will respond to open ended questions at phrase level with modA cues to expand/elaborate. SLP Short Term Goal 4 (Week 1): Patient will consume regular texture PO's with minimal difficulty/delay in mastication and minimal instances of cough/throat clear.  Skilled Therapeutic Interventions: Pt was seen for skilled ST targeting cognitive goals. Although attempted to provide trials of regular textures to stimulate appetite for pt and assess potential for advancement to liberalize diet, pt refused. She also became verbally agitated when attempting to engage pt in a functional conversation regarding safety awareness and fall prevention in the home, given her recent fall this AM. Pt required Max A cues for verbal problem solving and awareness when discussing fall prevention and how her deficits will impact her at home. Eventually pt was unwilling to participate further. She stated "get the hell out, I'm done talking" and would not engage with therapist any further." As a result she missed that last 14 mins of skilled ST session. Pt left laying in bed with alarm set and needs within reach. Continue per current plan of care.       Pain Pain Assessment Pain Scale: 0-10 Pain Score: 0-No pain - did report nausea  Therapy/Group: Individual Therapy  Arbutus Leas 06/28/2020, 7:16 AM

## 2020-06-28 NOTE — Progress Notes (Signed)
Physical Therapy Session Note  Patient Details  Name: [name] MRN: [id number] Date of Birth: 1970-05-08  Today's Date: 06/28/2020 PT Individual Time: 5852-7782  + 4235-3614 PT Individual Time Calculation (min): 55 min + 15 min  Short Term Goals: Week 1:  PT Short Term Goal 1 (Week 1): Pt will perform bed mobility with CGA and no bed features PT Short Term Goal 2 (Week 1): Pt will perform bed<>chair transfers with CGA and LRAD PT Short Term Goal 3 (Week 1): Pt will ambulate 130ft with CGA and LRAD PT Short Term Goal 4 (Week 1): Pt will initiate stair training  Skilled Therapeutic Interventions/Progress Updates:     1st session: Pt received supine in bed with NT and RN present for morning medications. Pt dry heaving and gagging into bag (per NT - pt purposefully doing this to get out of therapy, unable to confirm of verify). Donned pants with maxA for threading while she lay supine in bed with encouragement for participating in assisting to pull over hips. Supine<>sit with HOB flat and minA, primarily for trunk elevation to upright. Able to sit EOB with SBA while nurse distributed medications. Pt repeatedly saying during initial part of treatment "leave me alone, can't you see i'm throwing up!" She was redirectable with effort. Provided wash clothes for her to clean up with, which she did with setupA but required modA for thoroughness. Removed her own socks with cues via figure-4 technique while seated EOB and able to don hospital socks with setupA in similar fashion. Required minA for donning t-shirt and then minA for stand<>pivot from EOB to w/c with cues for safety and general sequencing. W/c transport for time management to main rehab gym. Completed 1x5 sit<>stands with minA to RW - cues for hand placement to arm rests of w/c. Gait 2x59ft + 53ft with minA and RW - epmhasis on safety awareness, RW management, and technique. Pt c/o R ankle pain while ambulating and during seated rest and  with questioning, reports that this is the ankle she fx in 12/6 and she has had chronic pain ever since, does not appear to be in distress and no antalgic gait noted. Performed a few bouts of standing toe taps with minA and RW onto cones but after a few reps, patient sitting without warning to w/c - encouraged her to use her words and let therapist know when onset of fatigue begins in order to avoid impulsive sitting. Patient then began dry heaving - so returned to room and provided her emesis bag - she remained seated in w/c at end of session with safety belt alarm on and needs within reach. Pt requesting to return to bed but encouraged her to remain sitting up as per therapy schedule, OT arriving in ~30 minutes.   2nd session: Pt received supine in bed, awake and initially hesitant to participate in therapy session but with encouragement and redirection, she was ultimately agreeable to just a short session. Completed supine<>sit with CGA with HOB slightly elevated, although effortful. Able to sit EOB with close supervision. Performed 1x5 sit<>stands with minA to RW - cues for hand placement and "powering" through LE's. Rest break provided prior to gait training. Gait 6ft + 18ft with minA and RW - needing cues for RW management and maintaining straight path - needing minA for general stability due to unsteadiness and forward flexed trunk. Cues for safety awareness and allowing therapist enough heads up to provide w/c for seated rest break. While returning back to bed -  she needed minA for safety approach and modA for safe sitting as she attempts to sit prematurely prior to being fully over surface. Sit>supine with minA and needing minA for repositioning in bed. At end of session, pt asking "hang out with me" - encouraged that therapy will resume tomorrow. Bed lowered, bed alarm on, needs within reach.  Therapy Documentation Precautions:  Precautions Precautions: Fall Precaution Comments:  cortrak Restrictions Weight Bearing Restrictions: No RLE Weight Bearing: Weight bearing as tolerated  Therapy/Group: Individual Therapy  Xzavion Doswell P Dyasia Firestine PT 06/28/2020, 6:31 AM

## 2020-06-28 NOTE — Progress Notes (Signed)
Pt vomited x1. Yellow in appearance. Pt positive for chronic abdominal pain. PA notified of pt emesis. Pt expresses she doesn't want to participate in therapy. Therapy in room, pt is participating.  Sheela Stack, LPN

## 2020-06-28 NOTE — Progress Notes (Addendum)
PROGRESS NOTE   Subjective/Complaints: Patient seen sitting up in her chair working with therapies this morning.  She states she slept fairly overnight.  She complains of nausea.  Discussed with nursing patient with one episode of vomiting.  Also noted to be anxious.  Discussed tube feed formulation with dietary.  ROS: + Nausea.  Appears to deny CP, SOB, diarrhea.   Objective:   DG Abd 1 View  Result Date: 06/28/2020 CLINICAL DATA:  Nausea and vomiting. EXAM: ABDOMEN - 1 VIEW COMPARISON:  06/16/2020. FINDINGS: Feeding tube noted with tip over the stomach. No bowel distention or free air. No acute bony abnormality. IMPRESSION: Feeding tube noted with tip over the stomach.  No acute abnormality. Electronically Signed   By: Marcello Moores  Register   On: 06/28/2020 06:45   Recent Labs    06/27/20 0550  WBC 7.7  HGB 8.6*  HCT 27.2*  PLT 349   Recent Labs    06/27/20 0550  NA 138  K 3.1*  CL 103  CO2 23  GLUCOSE 129*  BUN 8  CREATININE 1.16*  CALCIUM 8.9    Intake/Output Summary (Last 24 hours) at 06/28/2020 0941 Last data filed at 06/28/2020 6546 Gross per 24 hour  Intake 70 ml  Output 0 ml  Net 70 ml        Physical Exam: Vital Signs Blood pressure (!) 149/93, pulse 97, temperature 97.6 F (36.4 C), resp. rate 19, height 5\' 6"  (1.676 m), weight 80.7 kg, last menstrual period 06/03/2018, SpO2 95 %. Constitutional: No distress . Vital signs reviewed. HENT: Normocephalic.  Atraumatic.  + NG. Eyes: EOMI. No discharge. Cardiovascular: No JVD.  Tachycardia.  Regular rhythm. Respiratory: Normal effort.  No stridor.  Bilateral clear to auscultation. GI: Non-distended.  BS +. Skin: Warm and dry.  Intact. Psych: Normal mood.  Normal behavior. Musc: No edema in extremities.  No tenderness in extremities. Neurologic: Alert Motor: Appears to be 4/5 throughout, stable  Assessment/Plan: 1. Functional deficits which require 3+  hours per day of interdisciplinary therapy in a comprehensive inpatient rehab setting.  Physiatrist is providing close team supervision and 24 hour management of active medical problems listed below.  Physiatrist and rehab team continue to assess barriers to discharge/monitor patient progress toward functional and medical goals  Care Tool:  Bathing    Body parts bathed by patient: Right arm,Left arm,Chest,Abdomen,Face,Left upper leg,Right upper leg,Right lower leg,Left lower leg   Body parts bathed by helper: Front perineal area,Buttocks     Bathing assist Assist Level: Minimal Assistance - Patient > 75%     Upper Body Dressing/Undressing Upper body dressing   What is the patient wearing?: Hospital gown only    Upper body assist Assist Level: Minimal Assistance - Patient > 75%    Lower Body Dressing/Undressing Lower body dressing      What is the patient wearing?: Incontinence brief     Lower body assist Assist for lower body dressing: Moderate Assistance - Patient 50 - 74%     Toileting Toileting    Toileting assist Assist for toileting: 2 Helpers     Transfers Chair/bed transfer  Transfers assist     Chair/bed transfer  assist level: Minimal Assistance - Patient > 75%     Locomotion Ambulation   Ambulation assist      Assist level: Minimal Assistance - Patient > 75% Assistive device: Walker-rolling Max distance: 70   Walk 10 feet activity   Assist     Assist level: Minimal Assistance - Patient > 75% Assistive device: Walker-rolling   Walk 50 feet activity   Assist    Assist level: Minimal Assistance - Patient > 75% Assistive device: Walker-rolling    Walk 150 feet activity   Assist Walk 150 feet activity did not occur: Safety/medical concerns         Walk 10 feet on uneven surface  activity   Assist Walk 10 feet on uneven surfaces activity did not occur: Safety/medical concerns         Wheelchair     Assist Will  patient use wheelchair at discharge?: No             Wheelchair 50 feet with 2 turns activity    Assist            Wheelchair 150 feet activity     Assist          Medical Problem List and Plan: 1.Decreased functional mobilitysecondary to acute hypoxic respiratory failure with compromised airway in the setting of cardiac arrest/CPR and aspiration pneumonitis. Extubated 2/20- with likely anoxic brain injury due to 3 minutes minimum of asystole  Continue CIR 2. Antithrombotics: -DVT/anticoagulation: Continue Subcutaneous heparin -antiplatelet therapy: N/A 3. Pain Management:Tylenol as needed-  NO ZANAFLEX or other muscle relaxants- pt has specific recent hx of addiction to Zanaflex. 4. Mood/PTSD:Prozac 40 mg daily.  Will consult psychiatry   Will avoid benzos due to history of addiction   Prazosin started on 3/8 -antipsychotic agents: Abilify 5 mg daily 5. Neuropsych: This patientis?capable of making decisions onherown behalf. 6. Skin/Wound Care:Routine skin checks 7. Fluids/Electrolytes/Nutrition:Routine in and outs 8. AKI secondary to ischemic ATN following cardiac arrest. CRRT 06/08/2020 transition to hemodialysis 06/15/2020 currently on hold   Follow-up hemodialysis as needed  Cr.  1.16 on 3/7  Continue to monitor 9. Anemia of critical illness. Small hematochezia and stool morning of 06/16/2020. Follow-up gastroenterology services as needed. Continue Protonix  Hb 8.6 on 3/7  Continue to monitor 10. Hypertensionwith new Hypotension.  Vitals:   06/28/20 0644 06/28/20 0916  BP: (!) 154/95 (!) 149/93  Pulse: (!) 107 97  Resp: 20 19  Temp: 98.5 F (36.9 C) 97.6 F (36.4 C)  SpO2: 92% 95%   Decreased proamatine to 5mg  bid, d/ced on 3/7  Added low dose coreg  See #4 11. Prediabetes. Hemoglobin A1c 6.1. SSI 12. Dysphagia. Dysphagia #3 thin liquid diet.   Advance diet as tolerated  Cont NG with tube feeds 13. Right  ankle fracture status post ORIF 03/28/2020. Weightbearing as toleratednow. Follow-up per Dr. Mardelle Matte 14. Substance abuse. UDS positive for amphetamines. Not prescribed. Outpatient follow-up- also per husband abusing 64 mg Zanaflexdaily prior to [place]  Husband would like to avoid pain meds/muscle relaxers 15. Recent COVID-19 infection. Tested +1/18. Patient did not require treatment or hospitalization. Outside the window of isolation. 16. Class I obesity. BMI 30.74. Dietary follow-up 17. Bowel and bladder incontinence- new since hospitalization-  18. Chronic nausea- Reglan changed to PO  Very extensive GI w/u for similar sx in 2020 which was negative   Added mylicon, reduced TF rate, changed to p.o. formula-discussed with dietary  KUB personally reviewed, showing the replacement of NG tube,  otherwise unremarkable  Significant dry heaving as well  Suspect behavioral component 19. Hypokalemia:   K+ 3.1 on 3/7, labs ordered for tomorrow  Daily supplement initiated  20. Tachycardia: continue to monitor   Cont Coreg, increase as tolerated  See #10, #4 21. Low protein: edited diet order to include high protein foods.  22.  Sleep disturbance  Melatonin started on 3/8  LOS: 5 days A FACE TO FACE EVALUATION WAS PERFORMED  Orchid Glassberg Lorie Phenix 06/28/2020, 9:41 AM

## 2020-06-28 NOTE — Progress Notes (Signed)
Pt has been up  Since 2330, when she c/o nausea. Gave pt prn Zofran. Pt cont to repeatedly call , and when staff arrives, she keeps stating she's nauseas, and requests staff to stay in room w/ her. Gave ginger ale and heating pad, since Zofran wasn't bringing relief. Decided to bring Pt out to nurse station to monitor, but Pt requests back in bed 10 min later. Asked if she felt better, PT stated yes. After settling Pt back in, she calls again, repeatedly, stating nausea, and  requesting to come back out to nurse station when told we cannot stay in room with her due to other pt needs. Brought Pt back out to nurses station b/c of repeated calls. Pt then requests 5 minutes later to go back to bed. Took pt back to room. Resident went to sleep w/in 10 minutes of being back in room.

## 2020-06-28 NOTE — Progress Notes (Signed)
   06/28/20 0644  What Happened  Was fall witnessed? Yes  Who witnessed fall? Eyvonne Mechanic, RN  Patients activity before fall bathroom-assisted  Point of contact buttocks  Was patient injured? No  Follow Up  MD notified yes (dan)  Time MD notified 0630  Additional tests No  Simple treatment Other (comment)  Progress note created (see row info)  (No injury)  Adult Fall Risk Assessment  Risk Factor Category (scoring not indicated) Fall has occurred during this admission (document High fall risk)  Age 50  Fall History: Fall within 6 months prior to admission 5  Elimination; Bowel and/or Urine Incontinence 0  Elimination; Bowel and/or Urine Urgency/Frequency 0  Medications: includes PCA/Opiates, Anti-convulsants, Anti-hypertensives, Diuretics, Hypnotics, Laxatives, Sedatives, and Psychotropics 5  Patient Care Equipment 1  Mobility-Assistance 2  Mobility-Gait 2  Mobility-Sensory Deficit 0  Altered awareness of immediate physical environment 0  Impulsiveness 0  Lack of understanding of one's physical/cognitive limitations 0  Total Score 15  Patient Fall Risk Level High fall risk  Adult Fall Risk Interventions  Required Bundle Interventions *See Row Information* High fall risk - low, moderate, and high requirements implemented  Additional Interventions Family Supervision;Use of appropriate toileting equipment (bedpan, BSC, etc.)  Screening for Fall Injury Risk (To be completed on HIGH fall risk patients) - Assessing Need for Floor Mats  Risk For Fall Injury- Criteria for Floor Mats Confusion/dementia (+NuDESC, CIWA, TBI, etc.)  Will Implement Floor Mats Yes  Vitals  Temp 98.5 F (36.9 C)  BP (!) 154/95  MAP (mmHg) 114  BP Location Right Arm  BP Method Automatic  Patient Position (if appropriate) Lying  Pulse Rate (!) 107  Pulse Rate Source Monitor  Resp 20  Oxygen Therapy  SpO2 92 %  O2 Device Room Air  Pain Assessment  Pain Scale 0-10  Pain Score 0 (nausea)  Pain Type Other  (Comment) (nausea)  Pain Location Abdomen  Pain Descriptors / Indicators Discomfort;Constant  Pain Frequency Constant  Pain Onset On-going  Patients Stated Pain Goal 2  Pain Intervention(s) Medication (See eMAR)  PCA/Epidural/Spinal Assessment  Respiratory Pattern Regular;Unlabored  Neurological  Neuro (WDL) X  Level of Consciousness Alert  Orientation Level Oriented X4  Cognition Follows commands  Speech Clear  R Hand Grip Moderate  L Hand Grip Moderate   RUE Motor Response Purposeful movement  RUE Motor Strength 4  LUE Motor Response Purposeful movement  LUE Motor Strength 4  RLE Motor Response Purposeful movement  RLE Motor Strength 4  LLE Motor Response Purposeful movement  LLE Motor Strength 4  Neuro Symptoms Anxiety;Depression  Neuro symptoms relieved by Rest  Musculoskeletal  Musculoskeletal (WDL) X  Assistive Device Front wheel walker  Generalized Weakness Yes  Weight Bearing Restrictions No  RLE Weight Bearing WBAT  Musculoskeletal Details  RUE Weakness  LUE Weakness  RLE Weakness  LLE Weakness  Integumentary  Integumentary (WDL) X  Skin Color Appropriate for ethnicity  Skin Condition Dry  Skin Integrity Ecchymosis;Cracking  Cracking Location Foot  Cracking Location Orientation Bilateral  Ecchymosis Location Arm;Abdomen  Ecchymosis Location Orientation Bilateral;Lower  Ecchymosis Intervention Other (Comment) (assessed)

## 2020-06-28 NOTE — Progress Notes (Signed)
Physical Therapy Session Note  Patient Details  Name: [name] MRN: [id number] Date of Birth: 1971/03/08  Today's Date: 06/28/2020 PT Individual Time: 1115-1150 PT Individual Time Calculation (min): 35 min  and Today's Date: 06/28/2020 PT Missed Time: 10 Minutes Missed Time Reason: Patient unwilling to participate  Short Term Goals: Week 1:  PT Short Term Goal 1 (Week 1): Pt will perform bed mobility with CGA and no bed features PT Short Term Goal 2 (Week 1): Pt will perform bed<>chair transfers with CGA and LRAD PT Short Term Goal 3 (Week 1): Pt will ambulate 153ft with CGA and LRAD PT Short Term Goal 4 (Week 1): Pt will initiate stair training  Skilled Therapeutic Interventions/Progress Updates:    Patient received reclined in bed, asleep but easy to wake, agreeable to PT with extensive encouragement. She initially denied pain, but reported fatigue due to "having too much therapy." PT reiterating to patient that she is here to received 3 hours of therapy to ensure safe and efficient dc home. She was able to come sit edge of bed with supervision and transfer to wc via stand pivot with MinA. PT noting that patient puts forth minimal effort throughout therapy session. She was able to propel herself in wc x30ft with B UE and max cues and encouragement. PT transporting patient remainder of the way to therapy gym. Serpentine gait to cones with RW and CGA/MinA. Patient barely deviating from straight path by 6"-30ft. She reports being able to see cones, but would not attempt to walk to the cone. Patient often sitting prematurely when she is not fully turned resulting in unsafe sit. PT attempting to educate patient on turning fully to ensure that she lands in the chair, but patient seemed minimally receptive to this education. Patient stating "I'm done walking" but agreeable to standing dual task. She required MinA for sit <>stand and was able to assemble 2/4 blocks based on picture with CGA for  balance and no UE supnport. Patient sitting unexpectedly stating "I'm done with this." Patient agreeable to seated task: UE ergometer x2 mins at very slow speed before she stated again "I'm just done, take me back." PT transporting patient back to her room encouraging patient to stay up in chair to eat lunch. However, patient stated "I eat laying down." Patient transferring back to bed via stand pivot with MinA, returning supine with supervision. Bed alarm on, call light within reach.   Therapy Documentation Precautions:  Precautions Precautions: Fall Precaution Comments: cortrak Restrictions Weight Bearing Restrictions: No RLE Weight Bearing: Weight bearing as tolerated    Therapy/Group: Individual Therapy  Karoline Caldwell, PT, DPT, CBIS  06/28/2020, 7:43 AM

## 2020-06-28 NOTE — Consult Note (Signed)
[facility] Consult   Reason for Consult: Question competency, consultation for profound anxiety Referring Physician: Dr. Posey Pronto  Patient Identification: [name] MRN:  [id number] Principal Diagnosis: Acute metabolic encephalopathy Diagnosis:  Principal Problem:   Acute metabolic encephalopathy Active Problems:   Substance abuse ([place])   Hypertension   Anemia of chronic disease   N&V (nausea and vomiting)   Total Time spent with patient: 30 minutes  Subjective:   [name] is a 50 y.o. female patient admitted with cardiac arrest. On today's evaluation she is observed to be lying in bed, she is alert and oriented at this time. She is able to verbalize that she is anxious, however unable to describe her physical symptoms associated with her anxiety. With multiple prompts and redirection she was able to describe her symptoms as restless, frustration, loneliness, and scared. She denies any additional physical symptoms such as shortness of breath, hyperventilation, fast respirations, sweating. She denies any acute concerns at this time. She does not appear to interested in any additional medications as she appears motivated to go home. She states that her husband is a retired Clinical biochemist, and she is on disability for her bipolar disorder. She has no concerns about returning home as he lives there with her. She is also able to verbalize her reasons for presenting to the hospital. [facility] has appreciation and understanding of required therapeutic services to include speech, occupational therapy, and physical therapy. She states " I love Occupational therapy its the easiest. " She reports she hates cooking, and that her husband cooks most of her meals. She also reports that she is her legal guardian and ana make her own decision. She is alert and oriented, calm and cooperative. SHe is able to show understanding and appropriate rationalization as it relates discharging home. SHe is  able to show insight and proper judgement at this time. She declines any new medications as she is able to understand that initiation of medication will prevent her from discharging. She denies substance abuse, previous UDS positive for amphetamines. Per chart review she had a history of tizanidine addiction.  She denies any current suicidal ideations homicidal ideations and or hallucinations.   Patient is seen and assessed by this nurse practitioner. She is observed to be lying in bed, with an nasogastric tube in place. She is able to respond appropriately, her thought process is normal. She answers questions appropriately and is able to follow commands.  Her thought process is normal. She endorses history of bipolar, however denies depressive and or manic symptoms. She denies any anxiety or PTSD symptoms at this time. She appears hopeful and motivated to seek treatment. She denies any current thoughts of self harm, and is able to contract for safety. She Is unable to recall the events leading to this admission, although there is a reason to believe this was another suicide attempt. Patient with extensive psychiatric history appears to be stable at present, and doesn't present as an imminent risk to herself or others.   HPI:  51 y/o female with hx of opiate use, PTSD, and depression who presented to [facility] 2/16 after being found laying on the floor for several hours.  Found by EMS to have agonal respiratory pattern and then asystole. King airway placed and received epinephrine with 3 minutes of CPR before ROSC achieved.  In ER found to have severe acidosis, hypokalemia, acute renal failure, hypothermia, and shock.  UDS positive for amphetamines (not prescribed).  Past Psychiatric History: PTSD, Depression,  Opiate Dependence, Anxiety  Risk to Self:   No Risk to Others:   No Prior Inpatient Therapy:  2 Previous inpatient admissions.  Prior Outpatient Therapy:   None current  Past Medical History:  Past  Medical History:  Diagnosis Date  . Anxiety   . Arthritis    "joints ache all over" (10/15/2014)  . Barrett's esophagus   . Bulging lumbar disc   . Chronic lower back pain   . DDD (degenerative disc disease), cervical   . Depression   . Drug-seeking behavior   . Headache    "weekly" (10/15/2014)  . Hyperlipemia   . Hypertension   . PTSD (post-traumatic stress disorder)   . Skin cancer    "had them cut off my arms; don't know what kind"    Past Surgical History:  Procedure Laterality Date  . ABLATION ON ENDOMETRIOSIS  2008  . BIOPSY  12/27/2018   Procedure: BIOPSY;  Surgeon: Thornton Park, MD;  Location: [facility];  Service: Gastroenterology;;  . ESOPHAGOGASTRODUODENOSCOPY (EGD) WITH PROPOFOL N/A 12/27/2018   Procedure: ESOPHAGOGASTRODUODENOSCOPY (EGD) WITH PROPOFOL;  Surgeon: Thornton Park, MD;  Location: [facility];  Service: Gastroenterology;  Laterality: N/A;  . HEMORRHOID SURGERY  ~ 2002  . IR FLUORO GUIDE CV LINE RIGHT  06/14/2020  . IR REMOVAL TUN CV CATH W/O FL  06/23/2020  . IR US GUIDE VASC ACCESS RIGHT  06/14/2020  . ORIF ANKLE FRACTURE Right 03/28/2020   Procedure: OPEN REDUCTION INTERNAL FIXATION (ORIF) RIGHT BIMALLEOLAR ANKLE FRACTURE;  Surgeon: Marchia Bond, MD;  Location: [facility];  Service: Orthopedics;  Laterality: Right;   Family History:  Family History  Problem Relation Age of Onset  . Breast cancer Mother   . Diabetes Mother   . Breast cancer Maternal Grandmother   . Breast cancer Paternal Grandmother   . Colon polyps Paternal Grandmother   . Colon cancer Neg Hx   . Esophageal cancer Neg Hx   . Rectal cancer Neg Hx   . Stomach cancer Neg Hx    Family Psychiatric  History: Denies Social History:  Social History   Substance and Sexual Activity  Alcohol Use Not Currently   Comment: 10/15/2014 "I've drank before; nothing regular; don't drink now cause of RX I'm on"     Social History   Substance and Sexual Activity  Drug  Use No    Social History   Socioeconomic History  . Marital status: Married    Spouse name: Not on file  . Number of children: Not on file  . Years of education: Not on file  . Highest education level: Not on file  Occupational History  . Not on file  Tobacco Use  . Smoking status: Never Smoker  . Smokeless tobacco: Never Used  Vaping Use  . Vaping Use: Never used  Substance and Sexual Activity  . Alcohol use: Not Currently    Comment: 10/15/2014 "I've drank before; nothing regular; don't drink now cause of RX I'm on"  . Drug use: No  . Sexual activity: Not Currently  Other Topics Concern  . Not on file  Social History Narrative   [place] with husband. Never smoked; quit alcohol 10 years; used to work for [employer]. NO IV drug abuse.    Social Determinants of Health   Financial Resource Strain: Not on file  Food Insecurity: Not on file  Transportation Needs: Not on file  Physical Activity: Not on file  Stress: Not on file  Social Connections: Not on file   Additional Social History:    Allergies:   Allergies  Allergen Reactions  . Lactose Intolerance (Gi) Nausea Only    Labs:  Results for orders placed or performed during the hospital encounter of 06/23/20 (from the past 48 hour(s))  Glucose, capillary     Status: None   Collection Time: 06/26/20  4:43 PM  Result Value Ref Range   Glucose-Capillary 95 70 - 99 mg/dL    Comment: Glucose reference range applies only to samples taken after fasting for at least 8 hours.  Glucose, capillary     Status: Abnormal   Collection Time: 06/26/20  7:51 PM  Result Value Ref Range   Glucose-Capillary 112 (H) 70 - 99 mg/dL    Comment: Glucose reference range applies only to samples taken after fasting for at least 8 hours.  Glucose, capillary     Status: Abnormal   Collection Time: 06/26/20 11:03 PM  Result Value Ref Range   Glucose-Capillary 120 (H) 70 - 99 mg/dL    Comment: Glucose  reference range applies only to samples taken after fasting for at least 8 hours.  Glucose, capillary     Status: Abnormal   Collection Time: 06/27/20  3:48 AM  Result Value Ref Range   Glucose-Capillary 121 (H) 70 - 99 mg/dL    Comment: Glucose reference range applies only to samples taken after fasting for at least 8 hours.  Comprehensive metabolic panel     Status: Abnormal   Collection Time: 06/27/20  5:50 AM  Result Value Ref Range   Sodium 138 135 - 145 mmol/L   Potassium 3.1 (L) 3.5 - 5.1 mmol/L   Chloride 103 98 - 111 mmol/L   CO2 23 22 - 32 mmol/L   Glucose, Bld 129 (H) 70 - 99 mg/dL    Comment: Glucose reference range applies only to samples taken after fasting for at least 8 hours.   BUN 8 6 - 20 mg/dL   Creatinine, Ser 1.16 (H) 0.44 - 1.00 mg/dL   Calcium 8.9 8.9 - 10.3 mg/dL   Total Protein 6.5 6.5 - 8.1 g/dL   Albumin 2.6 (L) 3.5 - 5.0 g/dL   AST 18 15 - 41 U/L   ALT 19 0 - 44 U/L   Alkaline Phosphatase 91 38 - 126 U/L   Total Bilirubin 0.6 0.3 - 1.2 mg/dL   GFR, Estimated 58 (L) >60 mL/min    Comment: (NOTE) Calculated using the CKD-EPI Creatinine Equation (2021)    Anion gap 12 5 - 15    Comment: Performed at [facility] [address], [place], [place] [address]  CBC     Status: Abnormal   Collection Time: 06/27/20  5:50 AM  Result Value Ref Range   WBC 7.7 4.0 - 10.5 K/uL   RBC 2.83 (L) 3.87 - 5.11 MIL/uL   Hemoglobin 8.6 (L) 12.0 - 15.0 g/dL   HCT 27.2 (L) 36.0 - 46.0 %   MCV 96.1 80.0 - 100.0 fL   MCH 30.4 26.0 - 34.0 pg   MCHC 31.6 30.0 - 36.0 g/dL   RDW 14.1 11.5 - 15.5 %   Platelets 349 150 - 400 K/uL   nRBC 0.0 0.0 - 0.2 %    Comment: Performed at [facility], [place] [address] [place], [place] [address]  Glucose, capillary     Status: Abnormal   Collection Time: 06/27/20  8:14 AM  Result Value Ref Range  Glucose-Capillary 166 (H) 70 - 99 mg/dL    Comment: Glucose reference range applies only to samples taken after fasting for at  least 8 hours.  Glucose, capillary     Status: None   Collection Time: 06/27/20 11:34 AM  Result Value Ref Range   Glucose-Capillary 97 70 - 99 mg/dL    Comment: Glucose reference range applies only to samples taken after fasting for at least 8 hours.  Glucose, capillary     Status: None   Collection Time: 06/27/20  4:10 PM  Result Value Ref Range   Glucose-Capillary 89 70 - 99 mg/dL    Comment: Glucose reference range applies only to samples taken after fasting for at least 8 hours.  Glucose, capillary     Status: Abnormal   Collection Time: 06/27/20  8:03 PM  Result Value Ref Range   Glucose-Capillary 126 (H) 70 - 99 mg/dL    Comment: Glucose reference range applies only to samples taken after fasting for at least 8 hours.  Glucose, capillary     Status: Abnormal   Collection Time: 06/28/20 12:02 AM  Result Value Ref Range   Glucose-Capillary 134 (H) 70 - 99 mg/dL    Comment: Glucose reference range applies only to samples taken after fasting for at least 8 hours.  Glucose, capillary     Status: Abnormal   Collection Time: 06/28/20  4:14 AM  Result Value Ref Range   Glucose-Capillary 157 (H) 70 - 99 mg/dL    Comment: Glucose reference range applies only to samples taken after fasting for at least 8 hours.  Glucose, capillary     Status: Abnormal   Collection Time: 06/28/20  8:04 AM  Result Value Ref Range   Glucose-Capillary 144 (H) 70 - 99 mg/dL    Comment: Glucose reference range applies only to samples taken after fasting for at least 8 hours.   Comment 1 Notify RN   Glucose, capillary     Status: Abnormal   Collection Time: 06/28/20 11:48 AM  Result Value Ref Range   Glucose-Capillary 121 (H) 70 - 99 mg/dL    Comment: Glucose reference range applies only to samples taken after fasting for at least 8 hours.   Comment 1 Notify RN     Current Facility-Administered Medications  Medication Dose Route Frequency Provider Last Rate Last Admin  . acetaminophen (TYLENOL) tablet  650 mg  650 mg Oral Q6H PRN Cathlyn Parsons, PA-C   650 mg at 06/28/20 0759  . alum & mag hydroxide-simeth (MAALOX/MYLANTA) 200-200-20 MG/5ML suspension 30 mL  30 mL Oral Q4H PRN Bary Leriche, PA-C   30 mL at 06/27/20 1607  . ARIPiprazole (ABILIFY) tablet 5 mg  5 mg Oral Daily Darlina Sicilian, RPH   5 mg at 06/28/20 0759  . carvedilol (COREG) tablet 3.125 mg  3.125 mg Oral BID WC Kirsteins, Luanna Salk, MD   3.125 mg at 06/28/20 0759  . docusate (COLACE) 50 MG/5ML liquid 100 mg  100 mg Oral Daily PRN Darlina Sicilian, RPH      . feeding supplement (VITAL 1.5 CAL) liquid 800 mL  800 mL Per Tube Q24H Jamse Arn, MD 50 mL/hr at 06/27/20 1605 800 mL at 06/27/20 1605  . FLUoxetine (PROZAC) capsule 40 mg  40 mg Oral Daily Darlina Sicilian, RPH   40 mg at 06/28/20 0759  . free water 50 mL  50 mL Per Tube Q6H Patel, Domenick Bookbinder, MD   50 mL at  06/28/20 1201  . heparin injection 5,000 Units  5,000 Units Subcutaneous Q8H Cathlyn Parsons, PA-C   5,000 Units at 06/28/20 0531  . insulin aspart (novoLOG) injection 0-9 Units  0-9 Units Subcutaneous Q4H Cathlyn Parsons, PA-C   1 Units at 06/28/20 1201  . melatonin tablet 1.5 mg  1.5 mg Oral QHS Jamse Arn, MD      . metoCLOPramide (REGLAN) tablet 10 mg  10 mg Oral TID AC AngiulliLavon Paganini, PA-C   10 mg at 06/28/20 1201  . multivitamin (RENA-VIT) tablet 1 tablet  1 tablet Oral QHS Darlina Sicilian, [facility]   1 tablet at 06/27/20 2158  . ondansetron (ZOFRAN) 8 mg in sodium chloride 0.9 % 50 mL IVPB  8 mg Intravenous Q6H PRN Jamse Arn, MD      . ondansetron [facility]) tablet 4 mg  4 mg Oral Q6H PRN Jamse Arn, MD   4 mg at 06/28/20 0534  . pantoprazole sodium (PROTONIX) 40 mg/20 mL oral suspension 40 mg  40 mg Oral Q24H Darlina Sicilian, RPH   40 mg at 06/27/20 1605  . polyethylene glycol (MIRALAX / GLYCOLAX) packet 17 g  17 g Oral Daily PRN Darlina Sicilian, RPH      . potassium chloride (KLOR-CON) CR tablet 30 mEq  30 mEq  Oral Daily Jamse Arn, MD   30 mEq at 06/28/20 0759  . prazosin (MINIPRESS) capsule 1 mg  1 mg Oral QHS Patel, Domenick Bookbinder, MD      . simethicone (MYLICON) 40 QB/3.4LP suspension 20 mg  20 mg Oral QID Charlett Blake, MD   20 mg at 06/28/20 1202  . zolpidem (AMBIEN) tablet 10 mg  10 mg Oral QHS PRN Charlett Blake, MD   10 mg at 06/27/20 1957    Musculoskeletal: Strength & Muscle Tone: within normal limits Gait & Station: normal Patient leans: N/A  Psychiatric Specialty Exam: Physical Exam   Review of Systems   Blood pressure (!) 164/103, pulse 100, temperature 97.8 F (36.6 C), resp. rate 18, height 5\' 6"  (1.676 m), weight 80.7 kg, last menstrual period 06/03/2018, SpO2 100 %.Body mass index is 28.72 kg/m.  General Appearance: Fairly Groomed  Eye Contact:  Fair  Speech:  Clear and Coherent and Normal Rate  Volume:  Normal  Mood:  Anxious  Affect:  Appropriate and Congruent  Thought Process:  Coherent, Linear and Descriptions of Associations: Intact  Orientation:  Full (Time, Place, and Person)  Thought Content:  WDL  Suicidal Thoughts:  No  Homicidal Thoughts:  No  Memory:  Immediate;   Fair Recent;   Fair Remote;   Fair  Judgement:  Fair  Insight:  Fair  Psychomotor Activity:  Normal  Concentration:  Concentration: Fair and Attention Span: Fair  Recall:  [employer] of Knowledge:  Fair  Language:  Fair  Akathisia:  No  Handed:  Right  AIMS (if indicated):     Assets:  Communication Skills Desire for Improvement Financial Resources/Insurance Leisure Time Physical Health Social Support  ADL's:  Intact  Cognition:  WNL  Sleep:        Treatment Plan Summary: Plan Will continue Prozac 20mg  po daily and Abilify 5mg  po daily. At this time patient does not express any current suicidal ideations. She does not appear to be a imminent risk to herself or others and does not meet IVC criteria.   Depression:  -Continue current medications  Anxiety:  Reviewed coping skills to include increase in ambulation around the unit, increase social interaction, and music/TV. She states her husband comes to stay the night. Patient is located nearest the nursing station, and is on a very busy unit. She requests 1:1 for her anxiety. Discussed with patient about use of coping skills and how we do not feel that 1:1 observation is best for her at this time. She agrees.   Substance Use: -Minimizes use at this time, however urine drug screen positive. She has a history of opiate use disorder, and appears to be family concerns about assess to street medications. Recommend outpatient substance abuse program.   Physical Debility 2/t severe depression -Recommend intensive outpatient programming or Partial hospitalizations. .   Disposition: No evidence of imminent risk to self or others at present.   Patient does not meet criteria for psychiatric inpatient admission. Refer to IOP. Consider substance abuse outpatient program, due to UDS positive for amphetamines.   Suella Broad, FNP 06/28/2020 2:07 PM

## 2020-06-28 NOTE — Progress Notes (Signed)
Pt had assisted fall in bathroom w/ nurse. Slid to floor assisted, did not hit head, slid onto buttocks. No injuries or complaints from Pt.   Was the fall witnessed: Yes  Patient condition before and after the fall: Alert  Patient's reaction to the fall: confused  Name of the doctor that was notified including date and time: Dr. Bonney Aid  Any interventions and vital signs: B/P 154/95, P107, R20, O2 92% on RA

## 2020-06-29 DIAGNOSIS — F411 Generalized anxiety disorder: Secondary | ICD-10-CM

## 2020-06-29 DIAGNOSIS — G479 Sleep disorder, unspecified: Secondary | ICD-10-CM

## 2020-06-29 LAB — BASIC METABOLIC PANEL
Anion gap: 10 (ref 5–15)
BUN: 7 mg/dL (ref 6–20)
CO2: 21 mmol/L — ABNORMAL LOW (ref 22–32)
Calcium: 8.6 mg/dL — ABNORMAL LOW (ref 8.9–10.3)
Chloride: 106 mmol/L (ref 98–111)
Creatinine, Ser: 0.94 mg/dL (ref 0.44–1.00)
GFR, Estimated: >60 mL/min (ref >60)
Glucose, Bld: 161 mg/dL — ABNORMAL HIGH (ref 70–99)
Potassium: 3.9 mmol/L (ref 3.5–5.1)
Sodium: 137 mmol/L (ref 135–145)

## 2020-06-29 LAB — GLUCOSE, CAPILLARY
Glucose-Capillary: 118 mg/dL — ABNORMAL HIGH (ref 70–99)
Glucose-Capillary: 172 mg/dL — ABNORMAL HIGH (ref 70–99)
Glucose-Capillary: 109 mg/dL — ABNORMAL HIGH (ref 70–99)
Glucose-Capillary: 109 mg/dL — ABNORMAL HIGH (ref 70–99)
Glucose-Capillary: 112 mg/dL — ABNORMAL HIGH (ref 70–99)

## 2020-06-29 MED ORDER — VITAL 1.5 CAL PO LIQD
600.0000 mL | ORAL | Status: DC
  Administered 2020-06-29: 600 mL
  Filled 2020-06-29: qty 711

## 2020-06-29 MED ORDER — CARVEDILOL 6.25 MG PO TABS
6.2500 mg | ORAL_TABLET | Freq: Two times a day (BID) | ORAL | Status: DC
  Administered 2020-06-29 – 2020-07-02 (×6): 6.25 mg via ORAL
  Filled 2020-06-29 (×6): qty 1

## 2020-06-29 MED ORDER — POTASSIUM CHLORIDE CRYS ER 10 MEQ PO TBCR
10.0000 meq | EXTENDED_RELEASE_TABLET | Freq: Every day | ORAL | Status: DC
  Filled 2020-06-29: qty 1

## 2020-06-29 NOTE — Progress Notes (Signed)
Occupational Therapy Session Note  Patient Details  Name: [name] MRN: [id number] Date of Birth: May 08, 1970  Today's Date: 06/29/2020 OT Individual Time: 1100-1130 OT Individual Time Calculation (min): 30 min    Short Term Goals: Week 1:  OT Short Term Goal 1 (Week 1): Pt will be able to ambulate in and out of bathroom with min A with LRAD. OT Short Term Goal 2 (Week 1): Pt will be able to bathe self wtih CGA for balance. OT Short Term Goal 3 (Week 1): Pt will don pants with min A or less. OT Short Term Goal 4 (Week 1): Pt will be able to sit to stand with S to increase independence with LB self care. OT Short Term Goal 5 (Week 1): Pt will be able to toilet with min A.  Skilled Therapeutic Interventions/Progress Updates:    Pt seen this session to address transfers and standing balance.  Spouse present sitting in recliner during session.  Pt got very frustrated with me about working on toilet transfers, stating nursing is using the bed pan. Explained that she did toilet transfers yesterday with min A and needs to practice daily. Sat to EOB with S and cues as pt asks for help when she can physically do it.   Pt sat to EOB with S, transferred to wc with stand pivot with CGA and then to toilet with CGA using bars and back. Pt did not need to toilet and was frustrated that she had to practice transfer regardless.   Explained this was to work towards getting her home.  Transferred back to wc.  Worked on sit to stand to [employer] with close S and then standing and weight shifting for balance. After 30 sec, pt says "I'm tired" and sits right away not reaching back.  Had her rest in sitting while she worked on UE AROM exercises with hands clasped.  Pt could only reach arms up to 120 degrees and needed A/arom for full reach.  Bilateral arm circles for sh strength.   For endurance, had pt sit to stand from chair then reaching back to chair prior to sitting 10x in a row (no use of RW).  Pt breathing  hard.   She needs significant cues/encouragement as pt constantly states she is tired and does not want to do an activity. Rested briefly and then used RW to ambulate to sitting EOB.  Pt moved to supine without A.     Helped pt adjust height of head of bed, set alarm, made sure pt had what she needed and pt said "you can go now"'.  Therapy Documentation Precautions:  Precautions Precautions: Fall Precaution Comments: cortrak Restrictions Weight Bearing Restrictions: No RLE Weight Bearing: Weight bearing as tolerated    Pain: Pain Assessment Pain Scale: Faces Faces Pain Scale: Hurts a little bit Pain Type: Acute pain Pain Location: Generalized (+ nausea) Multiple Pain Sites: No ADL: ADL Grooming: Minimal cueing,Minimal assistance Where Assessed-Grooming: Sitting at sink Upper Body Bathing: Minimal cueing,Supervision/safety Where Assessed-Upper Body Bathing: Shower Lower Body Bathing: Minimal cueing,Contact guard Where Assessed-Lower Body Bathing: Shower Upper Body Dressing: Supervision/safety Where Assessed-Upper Body Dressing: Wheelchair Lower Body Dressing: Minimal cueing,Contact guard Where Assessed-Lower Body Dressing: Wheelchair Toileting: Moderate cueing,Minimal assistance Where Assessed-Toileting: Glass blower/designer: Psychiatric nurse Method: Arts development officer: Grab bars,Raised Counselling psychologist: Environmental education officer Method: Radiographer, therapeutic: Transfer tub bench,Grab bars   Therapy/Group: Individual Therapy  [place] 06/29/2020, 12:16  PM

## 2020-06-29 NOTE — Progress Notes (Signed)
Pt increase in behaviors this shift. Pt yelling out, calling staff names, threatening to "slap" staff. Pt requesting 1:1 and for staff to stay in room with her. Behaviors subside with husband at bedside but husband is not here at this time.  Sheela Stack, LPN

## 2020-06-29 NOTE — Patient Care Conference (Signed)
Inpatient RehabilitationTeam Conference and Plan of Care Update Date: 06/29/2020   Time: 11:18 AM    Patient Name: [name]      Medical Record Number: [id number]  Date of Birth: 18-Jul-1970 Sex: Female         Room/Bed: 4M03C/4M03C-01 Payor Info: Payor: MEDICARE / Plan: MEDICARE PART A AND B / Product Type: *No Product type* /    Admit Date/Time:  06/23/2020  3:59 PM  Primary Diagnosis:  Acute metabolic encephalopathy  Hospital Problems: Principal Problem:   Acute metabolic encephalopathy Active Problems:   Substance abuse ([place])   Hypertension   Anemia of chronic disease   N&V (nausea and vomiting)   Sleep disturbance   Anxiety state    Expected Discharge Date: Expected Discharge Date: 07/02/20  Team Members Present: Physician leading conference: Dr. Delice Lesch Care Coodinator Present: Dorien Chihuahua, RN, BSN, CRRN;Becky Dupree, LCSW Nurse Present: Annita Brod, LPN PT Present: Ginnie Smart, PT OT Present: Meriel Pica, OT SLP Present: Jettie Booze, CF-SLP PPS Coordinator present : Ileana Ladd, Burna Mortimer, SLP     Current Status/Progress Goal Weekly Team Focus  Bowel/Bladder   Continent x2. LBM 3/9  Remain continent x2 without needing intervention.      Swallow/Nutrition/ Hydration   Dys 3 textures, thin liquids, [place] due to limited PO intake  supervision  increase PO intake to take out cortrak, regular texture trials   ADL's   CGA to min A LB, supervision UB self care but with max cues for encouragement and mod cues for problem solving  supervision overall  ADL training, functional mobility, pt/ family education   Mobility   Bed mobility - minA, sit<>stand - minA and RW, bed<>chair - modA (due to poor safety awareness). Gait ~6ft with min/modA and RW. Limited by behavior, nausea, and effort  Supervision  Activity tolerance, functional transfers, standing balance, gait training, safety awareness   Communication   Min-Mod (mostly due to  cognitive impairments)  Min A  initiating responses, comprehension   Safety/Cognition/ Behavioral Observations  Mod A  Min A  basic functional problem solving, recall, initiation, attention, awareness, participation   Pain   Chronic gastric/abdominal pain, interventions not helping.  Pt will have pain <5 before discharge, ordered interventions will help.      Skin   Skin intact, bruising on abdomen.  Skin will remain intact with no further breakdown.        Discharge Planning:  Home with husband and two son's between all can almost do 24/7 care, pt tends to do what she wants to do even if not in her best interest. Husband has been here for therapies.   Team Discussion: Hx of polysubstance abuse and anxiety. Psychology consult completed yesterday. Continue to note chronic abd pain and nausea, although episodes of emesis have decreased. Anemia is stable per MD and BP trending up. Continent of bowel and bladder. Poor appetite; adjust HS tube feeds. Discussion of removal of cortrak and encourage po intake.  Patient on target to meet rehab goals: no, currently min assist for self care and able to ambulate 77' with CGA and a rolling walker and CGA for transfers. Requires cues for safety. Progress limited by anxiety, co-dependency, and need for firm direction and re-direction to stay on task. Supervision goals set for discharge.  *See Care Plan and progress notes for long and short-term goals.   Revisions to Treatment Plan:  SLP addressing problem solving, attention. Downgraded goals  Teaching Needs: Transfers, toileting, nutritional means,  medications, etc.   Current Barriers to Discharge: Decreased caregiver support, Behavior and Nutritional means  Possible Resolutions to Barriers: Family education    Medical Summary Current Status: Decreased functional mobility secondary to acute hypoxic respiratory failure with compromised airway in the setting of cardiac arrest/CPR and aspiration  pneumonitis.  Barriers to Discharge: Behavior;Medical stability;Nutrition means;Weight;Medication compliance;Other (comments)  Barriers to Discharge Comments: hx of substance abuse Possible Resolutions to [employer] Focus: Therapies, advance diet as tolerated, follow nausea (chronic), psych consulted, optimize HR meds, follow labs - K+, follow CBGs   Continued Need for Acute Rehabilitation Level of Care: The patient requires daily medical management by a physician with specialized training in physical medicine and rehabilitation for the following reasons: Direction of a multidisciplinary physical rehabilitation program to maximize functional independence : Yes Medical management of patient stability for increased activity during participation in an intensive rehabilitation regime.: Yes Analysis of laboratory values and/or radiology reports with any subsequent need for medication adjustment and/or medical intervention. : Yes   I attest that I was present, lead the team conference, and concur with the assessment and plan of the team.   Dorien Chihuahua B 06/29/2020, 3:11 PM

## 2020-06-29 NOTE — Progress Notes (Addendum)
PROGRESS NOTE   Subjective/Complaints: Patient seen lying in bed this morning.  Husband states he slept fairly overnight.  She was seen by psychiatry yesterday, note reviewed-no changes.  She notes improvement in nausea and vomiting, particularly with vomiting, with change in tube feed formulation.  Discussed nausea medications.  ROS: + Nausea, shortness of breath (baseline).  Appears to deny CP, diarrhea.   Objective:   DG Abd 1 View  Result Date: 06/28/2020 CLINICAL DATA:  Nausea and vomiting. EXAM: ABDOMEN - 1 VIEW COMPARISON:  06/16/2020. FINDINGS: Feeding tube noted with tip over the stomach. No bowel distention or free air. No acute bony abnormality. IMPRESSION: Feeding tube noted with tip over the stomach.  No acute abnormality. Electronically Signed   By: Marcello Moores  Register   On: 06/28/2020 06:45   Recent Labs    06/27/20 0550  WBC 7.7  HGB 8.6*  HCT 27.2*  PLT 349   Recent Labs    06/27/20 0550 06/29/20 0628  NA 138 137  K 3.1* 3.9  CL 103 106  CO2 23 21*  GLUCOSE 129* 161*  BUN 8 7  CREATININE 1.16* 0.94  CALCIUM 8.9 8.6*    Intake/Output Summary (Last 24 hours) at 06/29/2020 0958 Last data filed at 06/29/2020 7209 Gross per 24 hour  Intake 170 ml  Output 100 ml  Net 70 ml        Physical Exam: Vital Signs Blood pressure (!) 149/91, pulse (!) 109, temperature 98.3 F (36.8 C), temperature source Oral, resp. rate 18, height 5\' 6"  (1.676 m), weight 80.9 kg, last menstrual period 06/03/2018, SpO2 93 %. Constitutional: No distress . Vital signs reviewed. HENT: Normocephalic.  Atraumatic.  + NG. Eyes: EOMI. No discharge. Cardiovascular: No JVD.  Tachycardia.  Regular rhythm. Respiratory: Increased effort.  No stridor.  Bilateral clear to auscultation. GI: Non-distended.  Hypoactive bowel sounds. Skin: Warm and dry.  Intact. Psych: Normal mood.  Normal behavior. Musc: No edema in extremities.  No  tenderness in extremities. Neurologic: Alert Motor: Appears to be 4/5 throughout, unchanged  Assessment/Plan: 1. Functional deficits which require 3+ hours per day of interdisciplinary therapy in a comprehensive inpatient rehab setting.  Physiatrist is providing close team supervision and 24 hour management of active medical problems listed below.  Physiatrist and rehab team continue to assess barriers to discharge/monitor patient progress toward functional and medical goals  Care Tool:  Bathing    Body parts bathed by patient: Right arm,Left arm,Chest,Abdomen,Face,Left upper leg,Right upper leg,Right lower leg,Left lower leg,Front perineal area,Buttocks   Body parts bathed by helper: Front perineal area,Buttocks     Bathing assist Assist Level: Contact Guard/Touching assist     Upper Body Dressing/Undressing Upper body dressing   What is the patient wearing?: Pull over shirt    Upper body assist Assist Level: Supervision/Verbal cueing    Lower Body Dressing/Undressing Lower body dressing      What is the patient wearing?: Incontinence brief,Pants     Lower body assist Assist for lower body dressing: Minimal Assistance - Patient > 75%     Toileting Toileting    Toileting assist Assist for toileting: Contact Guard/Touching assist     Transfers  Chair/bed transfer  Transfers assist     Chair/bed transfer assist level: Minimal Assistance - Patient > 75%     Locomotion Ambulation   Ambulation assist      Assist level: Minimal Assistance - Patient > 75% Assistive device: Walker-rolling Max distance: 35   Walk 10 feet activity   Assist     Assist level: Minimal Assistance - Patient > 75% Assistive device: Walker-rolling   Walk 50 feet activity   Assist    Assist level: Minimal Assistance - Patient > 75% Assistive device: Walker-rolling    Walk 150 feet activity   Assist Walk 150 feet activity did not occur: Safety/medical concerns          Walk 10 feet on uneven surface  activity   Assist Walk 10 feet on uneven surfaces activity did not occur: Safety/medical concerns         Wheelchair     Assist Will patient use wheelchair at discharge?: No Type of Wheelchair: Manual    Wheelchair assist level: Supervision/Verbal cueing Max wheelchair distance: 15    Wheelchair 50 feet with 2 turns activity    Assist            Wheelchair 150 feet activity     Assist          Medical Problem List and Plan: 1.Decreased functional mobilitysecondary to acute hypoxic respiratory failure with compromised airway in the setting of cardiac arrest/CPR and aspiration pneumonitis. Extubated 2/20- with likely anoxic brain injury due to 3 minutes minimum of asystole  Continue CIR  Team conference today to discuss current and goals and coordination of care, home and environmental barriers, and discharge planning with nursing, case manager, and therapies. Please see conference note from today as well.  2. Antithrombotics: -DVT/anticoagulation: Continue Subcutaneous heparin -antiplatelet therapy: N/A 3. Pain Management:Tylenol as needed-  NO ZANAFLEX or other muscle relaxants- pt has specific recent hx of addiction to Zanaflex. 4. Mood/PTSD with anxiety:   Prozac 40 mg daily.     Will avoid benzos due to history of addiction   Prazosin started on 3/8   No changes per psych   ?  Improving -antipsychotic agents: Abilify 5 mg daily 5. Neuropsych: This patientis?capable of making decisions onherown behalf. 6. Skin/Wound Care:Routine skin checks 7. Fluids/Electrolytes/Nutrition:Routine in and outs 8. AKI secondary to ischemic ATN following cardiac arrest. CRRT 06/08/2020 transition to hemodialysis 06/15/2020 currently on hold   Follow-up hemodialysis as needed  Cr.  1.16 on 3/7  Continue to monitor 9. Anemia of critical illness. Small hematochezia and stool morning of  06/16/2020. Follow-up gastroenterology services as needed. Continue Protonix  Hb 8.6 on 3/7  Continue to monitor 10. Hypertensionwith new Hypotension.  Vitals:   06/28/20 2002 06/29/20 0407  BP: 136/86 (!) 149/91  Pulse: (!) 102 (!) 109  Resp: 18 18  Temp: 98.7 F (37.1 C) 98.3 F (36.8 C)  SpO2: 94% 93%   Decreased proamatine to 5mg  bid, d/ced on 3/7  Added low dose coreg, increased on 3/9  See #4 11.  Tube feed associated hypoglycemia on prediabetes. Hemoglobin A1c 6.1. SSI  Labile on 3/9 12. Dysphagia.  Dysphagia #3 thin liquid diet.   Advance diet as tolerated  Will consider DC NG tube.  Cont NG with tube feeds 13. Right ankle fracture status post ORIF 03/28/2020. Weightbearing as toleratednow. Follow-up per Dr. Mardelle Matte 14. Substance abuse. UDS positive for amphetamines. Not prescribed. Outpatient follow-up- also per husband abusing 64 mg Zanaflexdaily prior to [employer]  Husband would like to avoid pain meds/muscle relaxers 15. Recent COVID-19 infection. Tested +1/18. Patient did not require treatment or hospitalization. Outside the window of isolation. 16. Class I obesity. BMI 30.74. Dietary follow-up 17. Bowel and bladder incontinence- new since hospitalization-  18. Chronic nausea- Reglan changed to PO  Very extensive GI w/u for similar sx in 2020 which was negative   Added mylicon, reduced TF rate, changed to p.o. formula-discussed with dietary  KUB personally reviewed, showing the replacement of NG tube, otherwise unremarkable  Significant dry heaving as well  Suspect behavioral component 19. Hypokalemia:   K+ 3.9 on 3/9  Daily supplement initiated, decreased on 3/10 20. Tachycardia: continue to monitor   Cont Coreg, increase as tolerated  See #10, #4 21. Low protein: edited diet order to include high protein foods.  22.  Sleep disturbance  Melatonin started on 3/8  Benefit with husband spending night.  LOS: 6 days A FACE TO FACE EVALUATION WAS  PERFORMED  Jaicion Laurie Lorie Phenix 06/29/2020, 9:58 AM

## 2020-06-29 NOTE — Progress Notes (Signed)
Speech Language Pathology Daily Session Note  Patient Details  Name: [name] MRN: [id number] Date of Birth: 04-25-1970  Today's Date: 06/29/2020 SLP Individual Time: 1130-1145 SLP Individual Time Calculation (min): 15 min  Short Term Goals: Week 1: SLP Short Term Goal 1 (Week 1): Patient will tolerate Dys 3, thin liquids with minimal instances of coughing/throat clearing and with supervisionA. SLP Short Term Goal 2 (Week 1): Patient will maintain attention to functional basic level tasks for increments of 2-3 minutes with modA. SLP Short Term Goal 3 (Week 1): Patient will respond to open ended questions at phrase level with modA cues to expand/elaborate. SLP Short Term Goal 4 (Week 1): Patient will consume regular texture PO's with minimal difficulty/delay in mastication and minimal instances of cough/throat clear.  Skilled Therapeutic Interventions: Pt was seen for skilled ST targeting education with pt and husband at bedside. Education focused on current swallowing function, diet recommendation of Dys 3/thin and limited PO intake and generally poor progress toward goals in ST due to cognition and limited participation. Pt's husband did not have further questions. SLP continued to provide encouragement to participate in functional tasks and trials of regular textures to assess potential for solid advancement. Pt expressed if she could pick anything to eat it would be spaghetti, which SLP noted is within current diet recommendations and encouraged pt to try ordering it as meal. Pt refused any PO intake with SLP and missed last 15 mins skilled ST session. Will continue efforts to see pt as schedule allows. Pt left in bed with alarm set and needs within reach. Continue per current plan of care.          Pain Pain Assessment Pain Scale: Faces Faces Pain Scale: Hurts a little bit Pain Type: Acute pain Pain Location: Generalized (+ nausea)   Therapy/Group: Individual Therapy  Arbutus Leas 06/29/2020, 7:17 AM

## 2020-06-29 NOTE — Progress Notes (Signed)
Occupational Therapy Session Note  Patient Details  Name: [name] MRN: [id number] Date of Birth: 1970-04-25  Today's Date: 06/29/2020 OT Individual Time: 8850-2774 OT Individual Time Calculation (min): 57 min    Short Term Goals: Week 1:  OT Short Term Goal 1 (Week 1): Pt will be able to ambulate in and out of bathroom with min A with LRAD. OT Short Term Goal 2 (Week 1): Pt will be able to bathe self wtih CGA for balance. OT Short Term Goal 3 (Week 1): Pt will don pants with min A or less. OT Short Term Goal 4 (Week 1): Pt will be able to sit to stand with S to increase independence with LB self care. OT Short Term Goal 5 (Week 1): Pt will be able to toilet with min A.  Skilled Therapeutic Interventions/Progress Updates:     Pt received in bed agreeable to OT with encouragement to participate.  ADL:  Pt completes grooming seated at sink with VC for brushing back of hair Pt eats EOB seated with A to hold  Pt completes UB dressing with S to doff and don with A to manage NG tube from pulling   Therapeutic exercise Pt completes 2x10-15 dowel rod therex for BUE shoulder strengthening required for BADLs/functional transfers as follows with demo cuing and 1 # dowel rod with demo and instructional cues  Shoulder flex/ext, shoulder press, chest press  Pt requires MAX participation for engagement in all activities d/t self limiting behaviors  Therapeutic activity Pt stands over 3 trials to play uno stacko with ability to follow rule of game 75% of time. Pt demo B hand tremors and decreased activity tolerance. Pt requries S to stand, however will sit sbruptly when tired. Pt tolerates standing up to 40 seconds with prolonged breaks d/t fatigue/behavior.   Pt returned to bed with RW at ambulatory transfer back to bed with S. Pt left at end of session in bed with exit alarm on, call light in reach and all needs met   Therapy Documentation Precautions:  Precautions Precautions:  Fall Precaution Comments: cortrak Restrictions Weight Bearing Restrictions: No RLE Weight Bearing: Weight bearing as tolerated General:   Vital Signs: Therapy Vitals Temp: 98.3 F (36.8 C) Temp Source: Oral Pulse Rate: (!) 109 Resp: 18 BP: (!) 149/91 Patient Position (if appropriate): Lying Oxygen Therapy SpO2: 93 % O2 Device: Room Air Pain: Pain Assessment Pain Scale: 0-10 Pain Score: 2  Pain Type: Acute pain Pain Location: Head Pain Descriptors / Indicators: Aching;Constant;Pounding Pain Frequency: Intermittent Pain Onset: Awakened from sleep Patients Stated Pain Goal: 2 Pain Intervention(s): Medication (See eMAR) ADL: ADL Grooming: Minimal cueing,Minimal assistance Where Assessed-Grooming: Sitting at sink Upper Body Bathing: Minimal cueing,Supervision/safety Where Assessed-Upper Body Bathing: Shower Lower Body Bathing: Minimal cueing,Contact guard Where Assessed-Lower Body Bathing: Shower Upper Body Dressing: Supervision/safety Where Assessed-Upper Body Dressing: Wheelchair Lower Body Dressing: Minimal cueing,Contact guard Where Assessed-Lower Body Dressing: Wheelchair Toileting: Moderate cueing,Minimal assistance Where Assessed-Toileting: Glass blower/designer: Psychiatric nurse Method: Arts development officer: Dentist: Environmental education officer Method: Radiographer, therapeutic: Research scientist (life sciences)   Exercises:   Other Treatments:     Therapy/Group: Individual Therapy  Tonny Branch 06/29/2020, 6:53 AM

## 2020-06-29 NOTE — Progress Notes (Signed)
Physical Therapy Session Note  Patient Details  Name: [name] MRN: [id number] Date of Birth: 30-Mar-1971  Today's Date: 06/29/2020 PT Individual Time: 1530-1550 PT Individual Time Calculation (min): 20 min   Short Term Goals: Week 1:  PT Short Term Goal 1 (Week 1): Pt will perform bed mobility with CGA and no bed features PT Short Term Goal 2 (Week 1): Pt will perform bed<>chair transfers with CGA and LRAD PT Short Term Goal 3 (Week 1): Pt will ambulate 134ft with CGA and LRAD PT Short Term Goal 4 (Week 1): Pt will initiate stair training  Skilled Therapeutic Interventions/Progress Updates:    Patient in supine and complaining upon entry about fatigue as just completed OT session and back in bed.  With encouragement agreed to in bed LE therex.  In supine pt performed hip abduction, SAQ (2 sets), ankle pumps and bridging all x 10.  She refused further exercise due to fatigue.  Left with call bell in reach and bed alarm activated.   Therapy Documentation Precautions:  Precautions Precautions: Fall Precaution Comments: cortrak Restrictions Weight Bearing Restrictions: No RLE Weight Bearing: Weight bearing as tolerated General: PT Amount of Missed Time (min): 10 Minutes PT Missed Treatment Reason: Patient fatigue;Patient unwilling to participate Pain: Pain Assessment Pain Scale: Faces Faces Pain Scale: Hurts little more Pain Type: Acute pain Pain Location: Generalized Pain Descriptors / Indicators: Discomfort Pain Onset: On-going    Therapy/Group: Individual Therapy  Reginia Naas  Magda Kiel, PT 06/29/2020, 4:55 PM

## 2020-06-29 NOTE — Progress Notes (Signed)
Patient ID: [name], female   DOB: August 20, 1970, 50 y.o.   MRN: [id number] Met with pt and husband who was at her bedside to inform team conference goals supervision level and target discharge date of 3/12. She feels she is doing well and wants to go home sooner, she is aware MD would need to approve this. She feels she will eat better at home due to likes the food. She is agreeable to home health therapies. Has all equipment from past ankle fracture. Will work on discharge needs. Husband will provide supervision level.

## 2020-06-29 NOTE — Progress Notes (Signed)
Brief Nutrition Note  PA would like to transition pt to nocturnal tube feeds. Pt currently on Vital 1.5 cal formula at 50 ml/hr infusing over 16 hours from 1600 to 0800 (total of 800 ml). RD will transition pt to 12 hour schedule. Discussed with LPN.  Nocturnal tube feeds via Cortrak: - Vital 1.5 @ 50 ml/hr to run over 12 hours from 1900 to 0700 (total of 600 ml) - Free water flushes of 50 ml q 6 hours to maintain tube patency  Tube feeding regimen provides 900 kcal, 41 grams of protein, and 480 ml of H2O (meets 50% of kcal needs and 46% of protein needs).  Total free water with flushes: 680 ml   Gustavus Bryant, MS, RD, LDN Inpatient Clinical Dietitian Please see [facility] for contact information.

## 2020-06-29 NOTE — Progress Notes (Signed)
Physical Therapy Session Note  Patient Details  Name: [name] MRN: [id number] Date of Birth: 1971/04/18  Today's Date: 06/29/2020 PT Individual Time: 0900-0953 PT Individual Time Calculation (min): 53 min   Short Term Goals: Week 1:  PT Short Term Goal 1 (Week 1): Pt will perform bed mobility with CGA and no bed features PT Short Term Goal 2 (Week 1): Pt will perform bed<>chair transfers with CGA and LRAD PT Short Term Goal 3 (Week 1): Pt will ambulate 19ft with CGA and LRAD PT Short Term Goal 4 (Week 1): Pt will initiate stair training  Skilled Therapeutic Interventions/Progress Updates:    Pt received supine in bed, husband at bedside, patient hesitant but agreeable to therapy with effort. Pt c/o R ankle pain (chronic). Provided emotional support and rest breaks during session. Also provided ice pack at end of session, RN made aware. She donned socks with setupA while supine in bed, completed via figure-4 technique. Donned pants with modA for threading and needing cues for participating to pull over hips. Supine<>sit with minA for trunk elevation and able to sit EOB with close supervision. Donned shoes with totalA for time management. Stand<>pivot transfer with minA and no AD from EOB to w/c but needing mod cues for safety awareness during approach. W/c transport for time management to dayroom gym. Interval gait training - 4x31ft with minA and RW - cues for upright posturing, increasing B step length, and RW management. Emphasis on safety approach during session with proper hand placement, feeling chair against back of legs, RW management, etc (poor carryover). Performed standing toe taps on cones, 1x10 bilaterally, with CGA and RW, emphasizing hip flexion and BLE coordination. Completed Nustep for 2.5 minutes with workload of 3, using BLE and BUE focusing on reciprocal movement and general strengthening.  Returned to her room with [place] in w/c and performed stand<>pivot with minA from w/c  to EOB with no AD, again needing cues for safety awareness. Sit>Supine with supervision and bed features. Ended session supine in bed with needs in reach, bed alarm on, ice pack in place on R ankle. Patient requesting to return to the room to lay down in bed repeatedly throughout session - required gentle redirection periodically.  Therapy Documentation Precautions:  Precautions Precautions: Fall Precaution Comments: cortrak Restrictions Weight Bearing Restrictions: No RLE Weight Bearing: Weight bearing as tolerated  Therapy/Group: Individual Therapy  Neiko Trivedi P Jocie Meroney PT 06/29/2020, 7:41 AM

## 2020-06-30 DIAGNOSIS — F5089 Other specified eating disorder: Secondary | ICD-10-CM

## 2020-06-30 LAB — GLUCOSE, CAPILLARY
Glucose-Capillary: 109 mg/dL — ABNORMAL HIGH (ref 70–99)
Glucose-Capillary: 113 mg/dL — ABNORMAL HIGH (ref 70–99)
Glucose-Capillary: 130 mg/dL — ABNORMAL HIGH (ref 70–99)
Glucose-Capillary: 135 mg/dL — ABNORMAL HIGH (ref 70–99)

## 2020-06-30 MED ORDER — VITAL 1.5 CAL PO LIQD
720.0000 mL | ORAL | Status: DC
  Filled 2020-06-30: qty 1000

## 2020-06-30 MED ORDER — CLONIDINE HCL 0.1 MG PO TABS
0.1000 mg | ORAL_TABLET | Freq: Four times a day (QID) | ORAL | Status: DC | PRN
  Administered 2020-06-30: 0.1 mg via ORAL
  Filled 2020-06-30: qty 1

## 2020-06-30 MED ORDER — PRAZOSIN HCL 2 MG PO CAPS
2.0000 mg | ORAL_CAPSULE | Freq: Every day | ORAL | Status: DC
  Administered 2020-06-30 – 2020-07-01 (×2): 2 mg via ORAL
  Filled 2020-06-30 (×2): qty 1

## 2020-06-30 MED ORDER — POTASSIUM CHLORIDE 20 MEQ/15ML (10%) PO SOLN
10.0000 meq | Freq: Every day | ORAL | Status: DC
  Administered 2020-06-30 – 2020-07-02 (×3): 10 meq via ORAL
  Filled 2020-06-30 (×3): qty 15

## 2020-06-30 NOTE — Progress Notes (Signed)
Physical Therapy Session Note  Patient Details  Name: [name] MRN: [id number] Date of Birth: 1970-10-15  Today's Date: 06/30/2020 PT Individual Time: 9563-8756 PT Individual Time Calculation (min): 42 min   Short Term Goals: Week 1:  PT Short Term Goal 1 (Week 1): Pt will perform bed mobility with CGA and no bed features PT Short Term Goal 2 (Week 1): Pt will perform bed<>chair transfers with CGA and LRAD PT Short Term Goal 3 (Week 1): Pt will ambulate 169ft with CGA and LRAD PT Short Term Goal 4 (Week 1): Pt will initiate stair training  Skilled Therapeutic Interventions/Progress Updates:    Pt received supine in bed at start of session, reluctant but agreeable to therapy (with effort). Able to perform supine<>sit with supervision with use of bed features. Sit's EOB with supervision and patient then reporting need to use restroom. Sit<>stand with CGA to RW and she ambulated to bathroom, ~30ft, with CGA and RW - needing min cues for safety awareness, RW management, and maintaining proximity to RW. Also needed minA for safety approach to toilet as she attempts to sit prior to fully turning her hips to align with sitting surface. Pt continent of bladder. Sit<>stand with CGA to RW and ambulated to w/c, ~59ft, again needing reminders and cues for safety awareness. W/c transport to ortho gym. Performed car transfer, requiring minA and RW due to her sitting prematurely before being fully over surface despite cues, able to manage her LE's by herself. W/c transport to ADL apartment room. Ambulated in ADL apartment room with CGA and RW but needing cues for navigating environmental barriers as she has poor safety awareness. Able to perform bed mobility with supervision although needing cues for alignment and repositioning. She also performed furniture transfers with CGA and RW from low sitting sofa couch. Ended session by performing short bouts of gait training, 2x67ft, with CGA and RW - really  emphasizing safety with RW management. Pt returned to her room and requested to return to bed. Stand<>pivot with CGA back to bed and performed sit>supine with supervision. She remained supine in bed at end of session with needs in reach and bed alarm on. Of note, patient requesting frequently during our session to return to her room. She also had flat affect throughout (unchanged since admission), and reports a sedentary lifestyle prior to admission as she spends most of her time sitting on the couch or laying in bed watching TV.  Therapy Documentation Precautions:  Precautions Precautions: Fall Precaution Comments: cortrak Restrictions Weight Bearing Restrictions: No RLE Weight Bearing: Weight bearing as tolerated  Therapy/Group: Individual Therapy  Faylinn Schwenn P Zymere Patlan PT 06/30/2020, 7:42 AM

## 2020-06-30 NOTE — Discharge Summary (Addendum)
Physician Discharge Summary  Patient ID: [name] MRN: [id number] DOB/AGE: 1971-03-06 50 y.o.  Admit date: 06/23/2020 Discharge date: 07/02/2020  Discharge Diagnoses:  Principal Problem:   Acute metabolic encephalopathy Active Problems:   Substance abuse ([place])   Hypertension   Anemia of chronic disease   N&V (nausea and vomiting)   Sleep disturbance   Anxiety state DVT prophylaxis AKI secondary to ischemic ATN Decreased nutritional storage Right ankle fracture status post ORIF 03/28/2020 Substance abuse Class I obesity Remote COVID-19 PTSD  Discharged Condition: Stable  Significant Diagnostic Studies: DG Abd 1 View  Result Date: 06/28/2020 CLINICAL DATA:  Nausea and vomiting. EXAM: ABDOMEN - 1 VIEW COMPARISON:  06/16/2020. FINDINGS: Feeding tube noted with tip over the stomach. No bowel distention or free air. No acute bony abnormality. IMPRESSION: Feeding tube noted with tip over the stomach.  No acute abnormality. Electronically Signed   By: Marcello Moores  Register   On: 06/28/2020 06:45   DG Abd 1 View  Result Date: 06/23/2020 CLINICAL DATA:  Ileus, abdominal pain. EXAM: ABDOMEN - 1 VIEW COMPARISON:  June 16, 2020. FINDINGS: The bowel gas pattern is normal. No radio-opaque calculi or other significant radiographic abnormality are seen. IMPRESSION: Negative. Electronically Signed   By: Marijo Conception M.D.   On: 06/23/2020 12:51   CT Head Wo Contrast  Result Date: 06/08/2020 CLINICAL DATA:  Found on the floor. EXAM: CT HEAD WITHOUT CONTRAST TECHNIQUE: Contiguous axial images were obtained from the base of the skull through the vertex without intravenous contrast. COMPARISON:  03/21/2020 FINDINGS: Brain: The brain has a normal appearance without evidence of malformation, atrophy, old or acute small or large vessel infarction, mass lesion, hemorrhage, hydrocephalus or extra-axial collection. Vascular: Major vessels at the base of the brain show flow. Venous sinuses appear  patent. Skull and upper cervical spine: Normal. Sinuses/Orbits: Small amount of layering fluid in the right maxillary sinus. Other: None significant. IMPRESSION: 1. Normal appearance of the brain itself. 2. Small amount of layering fluid in the right maxillary sinus. Electronically Signed   By: Nelson Chimes M.D.   On: 06/08/2020 18:29   US RENAL  Result Date: 06/08/2020 CLINICAL DATA:  Acute renal insufficiency EXAM: RENAL / URINARY TRACT ULTRASOUND COMPLETE COMPARISON:  05/10/2020 FINDINGS: Right Kidney: Renal measurements: 8.5 x 4.7 x 4.2 cm = volume: 86.8 mL. Echogenicity within normal limits. No mass or hydronephrosis visualized. Left Kidney: Renal measurements: 10.3 x 4.5 x 4.2 cm = volume: 102.3 mL. Echogenicity within normal limits. No mass or hydronephrosis visualized. Bladder: Bladder is decompressed, limiting its evaluation. Other: None. IMPRESSION: Unremarkable renal ultrasound. Electronically Signed   By: Randa Ngo M.D.   On: 06/08/2020 22:42   IR Removal Tun Cv Cath W/O FL  Result Date: 06/24/2020 INDICATION: Patient with acute renal failure, status post tunneled dialysis catheter placement 06/14/2020. She has experienced renal recovery and is no longer in need of the catheter. Request is made for removal. EXAM: REMOVAL TUNNELED CENTRAL VENOUS CATHETER MEDICATIONS: None ANESTHESIA/SEDATION: None FLUOROSCOPY TIME:  None COMPLICATIONS: None immediate. PROCEDURE: Informed written consent was obtained from the patient after a thorough discussion of the procedural risks, benefits and alternatives. All questions were addressed. Maximal Sterile Barrier Technique was utilized including caps, mask, sterile gowns, sterile gloves, sterile drape, hand hygiene and skin antiseptic. A timeout was performed prior to the initiation of the procedure. The patient's right chest and catheter was prepped and draped in a normal sterile fashion. Heparin was removed from both ports of  catheter. 1% lidocaine was used  for local anesthesia. Using gentle blunt dissection the cuff of the catheter was exposed and the catheter was removed in it's entirety. Pressure was held till hemostasis was obtained. A sterile dressing was applied. The patient tolerated the procedure well with no immediate complications. IMPRESSION: Successful catheter removal as described above. Read by: Brynda Greathouse PA-C Electronically Signed   By: Ruthann Cancer MD   On: 06/24/2020 09:15   IR US Guide Vasc Access Right  Result Date: 06/14/2020 INDICATION: End-stage renal disease. In need of durable intravenous access for continuation hemodialysis. Patient with history placement of a non tunneled/temporary right jugular approach dialysis catheter, recently removed. EXAM: TUNNELED CENTRAL VENOUS HEMODIALYSIS CATHETER PLACEMENT WITH ULTRASOUND AND FLUOROSCOPIC GUIDANCE MEDICATIONS: Patient is currently admitted to the hospital receiving intravenous antibiotics. The antibiotic was given in an appropriate time interval prior to skin puncture. ANESTHESIA/SEDATION: None FLUOROSCOPY TIME:  18 seconds (4 mGy) COMPLICATIONS: None immediate. PROCEDURE: Informed written consent was obtained from the patient after a discussion of the risks, benefits, and alternatives to treatment. Questions regarding the procedure were encouraged and answered. The right neck and chest were prepped with chlorhexidine in a sterile fashion, and a sterile drape was applied covering the operative field. Maximum barrier sterile technique with sterile gowns and gloves were used for the procedure. A timeout was performed prior to the initiation of the procedure. After creating a small venotomy incision, a micropuncture kit was utilized to access the internal jugular vein. Note was made of a minimal amount nonocclusive thrombus within the right internal jugular vein, likely the sequela of recent dialysis catheter placement. Real-time ultrasound guidance was utilized for vascular access including  the acquisition of a permanent ultrasound image documenting patency of the accessed vessel. The microwire was utilized to measure appropriate catheter length. A stiff Glidewire was advanced to the level of the IVC and the micropuncture sheath was exchanged for a peel-away sheath. A palindrome tunneled hemodialysis catheter measuring 19 cm from tip to cuff was tunneled in a retrograde fashion from the anterior chest wall to the venotomy incision. The catheter was then placed through the peel-away sheath with tips ultimately positioned within the superior aspect of the right atrium. Final catheter positioning was confirmed and documented with a spot radiographic image. The catheter aspirates and flushes normally. The catheter was flushed with appropriate volume heparin dwells. The catheter exit site was secured with a 0-Prolene retention suture. The venotomy incision was closed with Dermabond and Steri-strips. Dressings were applied. The patient tolerated the procedure well without immediate post procedural complication. IMPRESSION: Successful placement of 19 cm tip to cuff tunneled hemodialysis catheter via the right internal jugular vein with tips terminating within the superior aspect of the right atrium. The catheter is ready for immediate use. Electronically Signed   By: Sandi Mariscal M.D.   On: 06/14/2020 15:57   DG Chest Port 1 View  Result Date: 06/11/2020 CLINICAL DATA:  Respiratory failure. EXAM: PORTABLE CHEST 1 VIEW COMPARISON:  06/10/2020 FINDINGS: Left IJ catheter tip is at the cavoatrial junction. Right IJ catheter tip is in the distal SVC. ETT tip is just above the carina. Feeding tube tip is well below the GE junction and is either in the distal stomach or proximal duodenum. Stable cardiomediastinal contours. Low lung volumes with asymmetric elevation of the right hemidiaphragm. Atelectasis is noted in the right lung base. IMPRESSION: Low lung volumes with asymmetric elevation of the right  hemidiaphragm and right base atelectasis. Electronically Signed  By: Kerby Moors M.D.   On: 06/11/2020 07:40   DG Chest Port 1 View  Result Date: 06/10/2020 CLINICAL DATA:  Respiratory failure EXAM: PORTABLE CHEST 1 VIEW COMPARISON:  06/09/2020 FINDINGS: Support devices are stable. Patient is rotated to the right, limiting study. Cardiomegaly. Bibasilar atelectasis, right greater than left. No visible effusions or acute bony abnormality. IMPRESSION: Limited study due to rotation. Bibasilar atelectasis. Support devices stable. Electronically Signed   By: Rolm Baptise M.D.   On: 06/10/2020 07:19   DG Chest Port 1 View  Result Date: 06/09/2020 CLINICAL DATA:  Respiratory failure. EXAM: PORTABLE CHEST 1 VIEW COMPARISON:  06/08/2020. FINDINGS: Endotracheal tube, left IJ line, right IJ line in stable position. Heart size stable. Low lung volumes with bibasilar atelectasis again noted. Bibasilar infiltrates cannot be excluded. Stable elevation right hemidiaphragm. No pleural effusion or pneumothorax. IMPRESSION: 1. Lines and tubes in stable position. 2. Low lung volumes with bibasilar atelectasis again noted. Bibasilar infiltrates cannot be excluded. Exam stable from prior exam. Electronically Signed   By: Marcello Moores  Register   On: 06/09/2020 05:26   DG CHEST PORT 1 VIEW  Result Date: 06/08/2020 CLINICAL DATA:  Central line placement. EXAM: PORTABLE CHEST 1 VIEW COMPARISON:  Radiograph 06/09/2019 FINDINGS: Right-sided dialysis catheter tip in the mid SVC. Left central line and endotracheal tube remain in place. No pneumothorax. Elevation of the right hemidiaphragm with basilar atelectasis, unchanged. Possible small right pleural effusion. Stable heart size and mediastinal contours. IMPRESSION: Right-sided dialysis catheter tip in the mid SVC. No pneumothorax. Otherwise stable exam. Electronically Signed   By: Keith Rake M.D.   On: 06/08/2020 20:41   DG Chest Portable 1 View  Result Date:  06/08/2020 CLINICAL DATA:  Central catheter placement.  Hypoxia. EXAM: PORTABLE CHEST 1 VIEW COMPARISON:  June 08, 2020 study obtained earlier in the day FINDINGS: Endotracheal tube tip is 3.2 cm above the carina. Central catheter tip is at the cavoatrial junction. No pneumothorax. There is atelectatic change in the right lower lung region with small right pleural effusion. There is stable elevation of the right hemidiaphragm. The left lung is clear. The heart size and pulmonary vascularity are normal. No adenopathy. No bone lesions IMPRESSION: Tube and catheter positions as described without pneumothorax. There is atelectatic change in the right lower lung region with small right pleural effusion. Left lung clear. Heart size within normal limits. Electronically Signed   By: Lowella Grip III M.D.   On: 06/08/2020 17:02   DG Chest Portable 1 View  Result Date: 06/08/2020 CLINICAL DATA:  Status post intubation. EXAM: PORTABLE CHEST 1 VIEW COMPARISON:  Single-view of the chest 05/10/2020. FINDINGS: New endotracheal tube is in place with the tip 2.3 cm above the carina. Defibrillator pad also noted. Mild subsegmental atelectasis is seen in the right lung base. Lungs otherwise clear. No pneumothorax or pleural fluid. IMPRESSION: ETT tip is 2.3 cm above the carina. Mild subsegmental atelectasis right lung base. Electronically Signed   By: Inge Rise M.D.   On: 06/08/2020 14:50   DG Abd Portable 1V  Result Date: 06/16/2020 CLINICAL DATA:  Abdominal pain EXAM: PORTABLE ABDOMEN - 1 VIEW COMPARISON:  May 11, 2020 FINDINGS: Enteric tube tip is in the distal stomach. There is contrast in the stomach. There also foci of contrast in the distal colon. There is no bowel dilatation or air-fluid level to suggest bowel obstruction. No free air. Central catheter tip is at the cavoatrial junction. Lung bases are clear. IMPRESSION: No bowel  obstruction or free air evident. Enteric tube tip in distal stomach.  Lung bases clear. Electronically Signed   By: Lowella Grip III M.D.   On: 06/16/2020 13:26   DG Swallowing Func-Speech Pathology  Result Date: 06/15/2020 Objective Swallowing Evaluation: Type of Study: MBS-Modified Barium Swallow Study  Patient Details Name: FLORIENE JESCHKE MRN: [id number] Date of Birth: [birth year] Today's Date: 06/15/2020 Time: SLP Start Time (ACUTE ONLY): 1439 -SLP Stop Time (ACUTE ONLY): 1500 SLP Time Calculation (min) (ACUTE ONLY): 21 min Past Medical History: Past Medical History: Diagnosis Date  Anxiety   Arthritis   "joints ache all over" (10/15/2014)  Bulging lumbar disc   Chronic lower back pain   DDD (degenerative disc disease), cervical   Depression   Drug-seeking behavior   Headache   "weekly" (10/15/2014)  Hyperlipemia   Hypertension   PTSD (post-traumatic stress disorder)   Skin cancer   "had them cut off my arms; don't know what kind" Past Surgical History: Past Surgical History: Procedure Laterality Date  ABLATION ON ENDOMETRIOSIS  2008  BIOPSY  12/27/2018  Procedure: BIOPSY;  Surgeon: Thornton Park, MD;  Location: [facility];  Service: Gastroenterology;;  ESOPHAGOGASTRODUODENOSCOPY (EGD) WITH PROPOFOL N/A 12/27/2018  Procedure: ESOPHAGOGASTRODUODENOSCOPY (EGD) WITH PROPOFOL;  Surgeon: Thornton Park, MD;  Location: [facility];  Service: Gastroenterology;  Laterality: N/A;  HEMORRHOID SURGERY  ~ 2002  IR FLUORO GUIDE CV LINE RIGHT  06/14/2020  IR US GUIDE VASC ACCESS RIGHT  06/14/2020  ORIF ANKLE FRACTURE Right 03/28/2020  Procedure: OPEN REDUCTION INTERNAL FIXATION (ORIF) RIGHT BIMALLEOLAR ANKLE FRACTURE;  Surgeon: Marchia Bond, MD;  Location: [facility];  Service: Orthopedics;  Laterality: Right; HPI: [name] who apparently fell out of bed during the night and was found to be in asystole, and she was intubated and underwent chest compressions as well as EPI with ROSC.  Pt was admitted for COVID-19 in mid January.  Pt was intubated from 2/16  - 2/20.  Subjective: Pt was alert and mildly confused Assessment / Plan / Recommendation CHL IP CLINICAL IMPRESSIONS 06/15/2020 Clinical Impression Pt presents with oropharyngeal dysphagia characterized by decreased bolus cohesion and a pharyngeal delay. She demonstrated premature spillage to the valleculae and pyriform sinuses, penetration (PAS 3) of thin liquids via straw, and aspiration (PAS 7) of thin liquids when consecutive swallows were used.  Aspiration resulted in coughing, but this was ineffective in expelling the aspirated material. Throat clearing was intermittently noted while the fluoro was off and it is anticipated that at least some instances of penetration ultimately resulted in aspiration.The barium tablet became lodged in the valleculae and this appeared to be at least partly due to the [place]. A dysphagia 2 diet with nectar thick liquids will be initiated at this time. Pt has denied a history of dysphagia and it is anticipated that the pt's dysphagia is temporary secondary to prolonged intubation. SLP will follow to assess improvement in swallow function and for diet advancement. SLP Visit Diagnosis Dysphagia, oropharyngeal phase (R13.12) Attention and concentration deficit following -- Frontal lobe and executive function deficit following -- Impact on safety and function Mild aspiration risk   CHL IP TREATMENT RECOMMENDATION 06/15/2020 Treatment Recommendations Therapy as outlined in treatment plan below   Prognosis 06/15/2020 Prognosis for Safe Diet Advancement Good Barriers to Reach Goals -- Barriers/Prognosis Comment -- CHL IP DIET RECOMMENDATION 06/15/2020 SLP Diet Recommendations Dysphagia 2 (Fine chop) solids;Nectar thick liquid Liquid Administration via Cup;Straw Medication Administration Whole meds with puree Compensations Slow  rate;Small sips/bites Postural Changes Seated upright at 90 degrees   CHL IP OTHER RECOMMENDATIONS 06/15/2020 Recommended Consults -- Oral Care Recommendations Oral  care BID Other Recommendations --   CHL IP FOLLOW UP RECOMMENDATIONS 06/15/2020 Follow up Recommendations Other (comment)   CHL IP FREQUENCY AND DURATION 06/15/2020 Speech Therapy Frequency (ACUTE ONLY) min 2x/week Treatment Duration 2 weeks      CHL IP ORAL PHASE 06/15/2020 Oral Phase Impaired Oral - Pudding Teaspoon -- Oral - Pudding Cup -- Oral - Honey Teaspoon -- Oral - Honey Cup -- Oral - Nectar Teaspoon -- Oral - Nectar Cup -- Oral - Nectar Straw Decreased bolus cohesion;Premature spillage Oral - Thin Teaspoon -- Oral - Thin Cup Decreased bolus cohesion;Premature spillage Oral - Thin Straw Decreased bolus cohesion;Premature spillage Oral - Puree WFL Oral - Mech Soft WFL Oral - Regular -- Oral - Multi-Consistency -- Oral - Pill -- Oral Phase - Comment --  CHL IP PHARYNGEAL PHASE 06/15/2020 Pharyngeal Phase Impaired Pharyngeal- Pudding Teaspoon -- Pharyngeal -- Pharyngeal- Pudding Cup -- Pharyngeal -- Pharyngeal- Honey Teaspoon -- Pharyngeal -- Pharyngeal- Honey Cup -- Pharyngeal -- Pharyngeal- Nectar Teaspoon -- Pharyngeal -- Pharyngeal- Nectar Cup -- Pharyngeal -- Pharyngeal- Nectar Straw Delayed swallow initiation-vallecula Pharyngeal -- Pharyngeal- Thin Teaspoon -- Pharyngeal -- Pharyngeal- Thin Cup Delayed swallow initiation-vallecula;Delayed swallow initiation-pyriform sinuses Pharyngeal -- Pharyngeal- Thin Straw Delayed swallow initiation-vallecula;Delayed swallow initiation-pyriform sinuses;Penetration/Aspiration during swallow;Penetration/Apiration after swallow Pharyngeal Material enters airway, remains ABOVE vocal cords and not ejected out;Material enters airway, passes BELOW cords and not ejected out despite cough attempt by patient Pharyngeal- Puree Delayed swallow initiation-vallecula Pharyngeal -- Pharyngeal- Mechanical Soft Delayed swallow initiation-vallecula Pharyngeal -- Pharyngeal- Regular -- Pharyngeal -- Pharyngeal- Multi-consistency -- Pharyngeal -- Pharyngeal- Pill Delayed swallow  initiation-vallecula;Pharyngeal residue - valleculae Pharyngeal -- Pharyngeal Comment --  CHL IP CERVICAL ESOPHAGEAL PHASE 06/15/2020 Cervical Esophageal Phase WFL Pudding Teaspoon -- Pudding Cup -- Honey Teaspoon -- Honey Cup -- Nectar Teaspoon -- Nectar Cup -- Nectar Straw -- Thin Teaspoon -- Thin Cup -- Thin Straw -- Puree -- Mechanical Soft -- Regular -- Multi-consistency -- Pill -- Cervical Esophageal Comment -- Shanika I. Hardin Negus, [place], [place] Office number [phone] Pager [phone] Horton Marshall 06/15/2020, 3:35 PM              ECHOCARDIOGRAM COMPLETE  Result Date: 06/09/2020    ECHOCARDIOGRAM REPORT   Patient Name:   [name] Date of Exam: 06/09/2020 Medical Rec #:  [id number]       Height:       66.0 in Accession #:    [id number]      Weight:       194.0 lb Date of Birth:  05/31/1970      BSA:          1.974 m Patient Age:    82 years        BP:           122/78 mmHg Patient Gender: F               HR:           101 bpm. Exam Location:  Inpatient Procedure: 2D Echo Indications:    Cardiac Arrest I46.9  History:        Patient has prior history of Echocardiogram examinations, most                 recent 12/13/2014. Risk Factors:Hypertension and Dyslipidemia.  Sonographer:    Mikki Santee RDCS (AE)  Referring Phys: [place]  1. Left ventricular ejection fraction, by estimation, is 55 to 60%. The left ventricle has normal function. The left ventricle has no regional wall motion abnormalities. Left ventricular diastolic parameters were normal.  2. Right ventricular systolic function is normal. The right ventricular size is normal.  3. The mitral valve is normal in structure. Trivial mitral valve regurgitation. No evidence of mitral stenosis.  4. The aortic valve is grossly normal. Aortic valve regurgitation is moderate. Comparison(s): Changes from prior study are noted. Now with moderate aortic regurgitation. FINDINGS  Left Ventricle: Left  ventricular ejection fraction, by estimation, is 55 to 60%. The left ventricle has normal function. The left ventricle has no regional wall motion abnormalities. The left ventricular internal cavity size was normal in size. There is  no left ventricular hypertrophy. Left ventricular diastolic parameters were normal. Right Ventricle: The right ventricular size is normal. Right vetricular wall thickness was not well visualized. Right ventricular systolic function is normal. Left Atrium: Left atrial size was normal in size. Right Atrium: Right atrial size was normal in size. Pericardium: There is no evidence of pericardial effusion. Mitral Valve: The mitral valve is normal in structure. Trivial mitral valve regurgitation. No evidence of mitral valve stenosis. Tricuspid Valve: The tricuspid valve is normal in structure. Tricuspid valve regurgitation is trivial. No evidence of tricuspid stenosis. Aortic Valve: The aortic valve is grossly normal. Aortic valve regurgitation is moderate. Aortic regurgitation PHT measures 325 msec. Pulmonic Valve: The pulmonic valve was not well visualized. Pulmonic valve regurgitation is not visualized. No evidence of pulmonic stenosis. Aorta: The aortic root, ascending aorta and aortic arch are all structurally normal, with no evidence of dilitation or obstruction. Venous: IVC assessment for right atrial pressure unable to be performed due to mechanical ventilation. IAS/Shunts: The atrial septum is grossly normal.  LEFT VENTRICLE PLAX 2D LVIDd:         4.90 cm  Diastology LVIDs:         3.20 cm  LV e' medial:    8.38 cm/s LV PW:         0.80 cm  LV E/e' medial:  11.7 LV IVS:        0.90 cm  LV e' lateral:   10.80 cm/s LVOT diam:     1.90 cm  LV E/e' lateral: 9.1 LV SV:         52 LV SV Index:   26 LVOT Area:     2.84 cm  RIGHT VENTRICLE RV S prime:     18.80 cm/s TAPSE (M-mode): 1.8 cm LEFT ATRIUM             Index       RIGHT ATRIUM          Index LA diam:        2.80 cm 1.42 cm/m  RA  Area:     8.38 cm LA Vol (A2C):   41.3 ml 20.92 ml/m RA Volume:   14.10 ml 7.14 ml/m LA Vol (A4C):   23.9 ml 12.11 ml/m LA Biplane Vol: 32.1 ml 16.26 ml/m  AORTIC VALVE LVOT Vmax:   115.00 cm/s LVOT Vmean:  77.600 cm/s LVOT VTI:    0.182 m AI PHT:      325 msec  AORTA Ao Root diam: 2.70 cm MITRAL VALVE MV Area (PHT): 3.08 cm     SHUNTS MV Decel Time: 246 msec     Systemic VTI:  0.18 m MV E velocity:  98.30 cm/s   Systemic Diam: 1.90 cm MV A velocity: 126.00 cm/s MV E/A ratio:  0.78 Buford Dresser MD Electronically signed by Buford Dresser MD Signature Date/Time: 06/09/2020/3:25:05 PM    Final     Labs:  Basic Metabolic Panel: Recent Labs  Lab 06/24/20 0801 06/27/20 0550 06/29/20 0628  NA 137 138 137  K 3.3* 3.1* 3.9  CL 102 103 106  CO2 22 23 21*  GLUCOSE 162* 129* 161*  BUN _0 CREATININE 1.47* 1.16* 0.94  CALCIUM 8.9 8.9 8.6*    CBC: Recent Labs  Lab 06/24/20 0801 06/27/20 0550  WBC 5.8 7.7  NEUTROABS 3.8  --   HGB 8.6* 8.6*  HCT 26.7* 27.2*  MCV 96.4 96.1  PLT 268 349    CBG: Recent Labs  Lab 06/29/20 2002 06/29/20 2359 06/30/20 0401 06/30/20 0843 06/30/20 1122  GLUCAP 112* 135* 130* 109* 113*   Family history.  Mother with breast cancer diabetes.  Maternal grandmother with breast cancer.  Paternal grandmother with breast cancer.  Negative colon cancer esophageal cancer or rectal cancer  Brief HPI:   JAHNE KRUKOWSKI is a 50 y.o. right-handed female with history of depression, opiate use, PTSD with reported prior suicide attempts maintained on Prozac Lamictal as well as Risperdal, hypertension, Covid 1/18, endometriosis, ORIF right ankle 03/28/2020 and is weightbearing as tolerated sedentary since recent ankle fracture.  Per chart review lives with spouse and 2 adult aged children.  1 level home 2 steps to entry.  Presented 06/08/2020 after reportedly falling out of bed at some point during the night and family let her sleep on the floor when they  later checked on her she was unresponsive.  First responders found her to be pulseless and asystolic.  She was intubated with a King airway and received CPR x3 minutes and epi x1 with ROSC.  Immediately post ROSC her EKG was concerning for STEMI but subsequent EKG were improved.  Cranial CT scan negative.  Chemistries revealed potassium 6.5 chloride 89 glucose 180 BUN to 14 creatinine 24.91 from baseline 0.83, WBC 16,200 hemoglobin 10.6 troponin 70 urine drug screen positive amphetamines alcohol negative lactic acid 4.4 CK 5564 magnesium 2.7 hemoglobin A1c 6.1.  Renal ultrasound unremarkable.  Echocardiogram with ejection fraction of 55 to 60% no wall motion abnormalities.  She remained intubated through 22/20/2022.  Started on CRRT and transition to hemodialysis on 06/15/2020 followed by renal services latest renal function much improved creatinine 2.24-1.81 her hemodialysis currently was on hold showing renal recovery.  Chest x-ray suspicious for aspiration pneumonia completed course of Rocephin.  Gastroenterology consulted 06/16/2020 for nonspecific lower abdominal pain some bright red blood per rectum hemoglobin dipping to 7.1.  It was noted patient with extensive GI work-up in 6122 for IDA and cyclic vomiting to include EGD showing non-H. pylori gastritis colonoscopy benign rectal erythema and VCE-1 small nonbleeding distal AVM.  She was transfused 1 unit packed red blood cells gastroenterology services felt no further work-up was needed close monitoring of hemoglobin hematocrit.  Her hemoglobin stabilized 8.3.  She was cleared to begin heparin for DVT prophylaxis.  Her cardiac status remained stable followed by Dr. Alvester Chou of cardiology services.  She was maintained on midodrine for blood pressure.  Psychiatry consulted for history of depression PTSD with prior suicide attempts currently maintained on Prozac as prior admissions with the addition of Abilify.  She tolerated a dysphagia #2 thin liquid diet as well as  nasogastric tube feeds for nutritional support.  Due to patient decreased functional mobility she was admitted for a comprehensive rehab program   Hospital Course: ELYNN PATTESON was admitted to rehab 06/23/2020 for inpatient therapies to consist of PT, ST and OT at least three hours five days a week. Past admission physiatrist, therapy team and rehab RN have worked together to provide customized collaborative inpatient rehab.  Pertaining to patient's debility related to acute hypoxic respiratory failure with compromised airway in the setting of cardiac arrest/CPR aspiration pneumonitis.  Extubated 06/12/2020.  She was attending therapies with steady progress.  Subcutaneous heparin for DVT prophylaxis no bleeding episodes.  Pain management with noted history of substance abuse UDS was positive for amphetamines not prescribed.  Patient and noted patient had also been abusing Zanaflex daily prior to admission.  Husband preferred to avoid any pain medication or muscle relaxers.  AKI secondary to ischemic ATN initial CRRT transition to hemodialysis since discontinued latest creatinine 1.16.  Anemia of critical chronic illness small hematochezia and stool morning of 06/16/2020 follow-up gastrology services as needed continue Protonix no further work-up was indicated as well as patient with noted history of chronic nausea maintained on Reglan extensive GI work-up similar in 2020 was negative added Mylicon with good results decrease in tube feeds diet slowly advance KUB unremarkable felt there was somewhat of a behavioral component to her nausea it was discussed with husband who felt she would eat much better at home.  Mood stabilization with Abilify as well as Prozac follow-up psychiatry services for her anxiety emotional support provided.  Recent COVID-19 infection January 18 patient did not require treatment or hospitalization outside the window of isolation.  Blood pressure mean controlled on Coreg as well as low-dose  Minipress.  She was slowly weaned off of her ProAmatine.  She would need follow-up outpatient.  Patient with history of right ankle fracture ORIF 03/28/2020 weightbearing as tolerated follow-up outpatient Dr. Dion Saucier.   Blood pressures were monitored on TID basis and soft and monitored     Rehab course: During patient's stay in rehab weekly team conferences were held to monitor patient's progress, set goals and discuss barriers to discharge. At admission, patient required minimal guard stand pivot transfers minimal assist 60 feet rolling walker supervision grooming set up upper body bathing mod assist lower body bathing minimal assist upper body dressing mod assist lower body dressing  Physical exam.  Blood pressure 141/86 pulse 102 temperature 99 respirations 18 oxygen saturation 94% room air Constitutional.  Mood was flat but appropriate HEENT Head.  Normocephalic and atraumatic Eyes.  Pupils round and reactive to light no discharge without nystagmus Neck.  Supple nontender no JVD without thyromegaly Cardiac regular rate rhythm without extra sounds or murmur heard Abdomen.  Soft nontender positive bowel sounds without rebound Respiratory effort normal no respiratory distress without wheeze Musculoskeletal.  Normal range of motion no rigidity Comments.  Upper extremities somewhat poor compliance to commands and poor effort MS.  At least 4/5 in upper extremities finger abduction slightly less but would not try during exam Lower extremities right lower extremity hip flexion 3+/5 knee extension 3+/5 at least 4/5 distally Left lower extremity hip flexion 3 -/5 knee extension 3 -/5 dorsiflexion plantarflexion 4/5 Skin.  Warm and dry splotchy red splotches on 1st-4th toes of left foot. Neurologic she was alert mood was flat makes eye contact with examiner follows commands   He/She  has had improvement in activity tolerance, balance, postural control as well as ability to compensate for deficits.  He/She has  had improvement in functional use RUE/LUE  and RLE/LLE as well as improvement in awareness.  She donned socks with set up while supine in bed.  Donned pants with moderate assist.  Supine to sit with minimal assist for trunk elevation and able to sit edge of bed with close supervision.  Donned shoes with total assist for time management.  Stand pivot transfers with minimal assist no assistive device ambulates 35 feet minimal assist rolling walker.  Emphasis on safety approach during sessions needing some encouragement at times to participate.  Full family teaching completed plan discharge to home       Disposition: Discharged to home    Diet: Mechanical soft  Special Instructions: No driving smoking or alcohol  Medications at discharge 1.  Tylenol as needed 2.  Abilify 5 mg p.o. daily 3.  Coreg 6.25 mg p.o. twice daily with meals 4.  Prozac 40 mg p.o. daily 5.  Melatonin 1.5 mg p.o. nightly 6.  Reglan 10 mg p.o. 3 times daily before meals 7.  Rena-Vite 1 tablet p.o. nightly 8.  Protonix 40 mg p.o. daily 9.  Minipress 1 mg p.o. nightly 10.  Ferrous sulfate 325 mg daily  30-35 minutes were spent completing discharge summary and discharge planning  Discharge Instructions     Ambulatory referral to Physical Medicine Rehab   Complete by: As directed    Follow up one month metabolic encephalopathy        Follow-up Information     Jamse Arn, MD Follow up.   Specialty: Physical Medicine and Rehabilitation Why: Office to call for appointment Contact information: [address] [place] [address] [phone]         Lavena Bullion, DO Follow up.   Specialty: Gastroenterology Why: Call for appointment as needed Contact information: [place] [place] [address] [phone]         Lorretta Harp, MD Follow up.   Specialties: Cardiology, Radiology Why: Call for appointment as needed Contact  information: [address] [place] [address] [phone]         Donato Heinz, MD Follow up.   Specialty: Nephrology Why: Call for appointment as needed Contact information: [place] [address] [phone]                 Signed: Cathlyn Parsons 07/01/2020, 5:25 AM Patient was seen, face-face, and physical exam performed by me on day of discharge, greater than 30 minutes of total time spent.. Please see progress note from day of discharge as well.  Delice Lesch, MD, ABPMR

## 2020-06-30 NOTE — Plan of Care (Signed)
  Problem: RH Stairs Goal: LTG Patient will ambulate up and down stairs w/assist (PT) Description: LTG: Patient will ambulate up and down # of stairs with assistance (PT) Outcome: Not Applicable Note: N/A. Pt reports she has a ramp to enter home and does not use steps   Problem: RH Bed to Chair Transfers Goal: LTG Patient will perform bed/chair transfers w/assist (PT) Description: LTG: Patient will perform bed to chair transfers with assistance (PT). Flowsheets (Taken 06/30/2020 1244) LTG: Pt will perform Bed to Chair Transfers with assistance level: Contact Guard/Touching assist Note: Downgraded due to poor safety awareness   Problem: RH Ambulation Goal: LTG Patient will ambulate in controlled environment (PT) Description: LTG: Patient will ambulate in a controlled environment, # of feet with assistance (PT). Note: Downgraded due to poor safety awareness Goal: LTG Patient will ambulate in home environment (PT) Description: LTG: Patient will ambulate in home environment, # of feet with assistance (PT). Note: Downgraded due to poor safety awareness

## 2020-06-30 NOTE — Progress Notes (Signed)
Patient ID: [name], female   DOB: [birth year], 50 y.o.   MRN: [id number] Have made referral to [facility] for PT & OT follow up. When discussed OP substance abuse resource pt did not want to discuss and looked at me blankly. Discussed this was recommended on acute before coming to rehab. Will try again tomorrow. Pt does what she wants and will do this at discharge.

## 2020-06-30 NOTE — Progress Notes (Signed)
Nutrition Follow-up  DOCUMENTATION CODES:   Not applicable  INTERVENTION:   Recommend continuing nocturnal tube feeding via Cortrak at this time. PO intake is inadequate (0-10% at meals). Pt is at high risk for malnutrition.  Continue nocturnal tube feeds via Cortrak: - Increase Vital 1.5 to 60 ml/hr to run over 12 hours from 1900 to 0700 (total of 720 ml) - Free water flushes of 50 ml q 6 hours  Tube feeding regimen provides 1080 kcal, 49 grams of protein, and 550 ml of H2O (meets 60% of kcal needs and 54% of protein needs).  Total free water with flushes: 920 ml  -Continue MagicCup TID with meals, each supplement provides 290 kcal and 9 grams of protein  -Continue Vital Cuisine ShakeTID with meals, each supplement provides 520 kcal and 22 grams of protein  - Continue renal MVI daily  NUTRITION DIAGNOSIS:   Inadequate oral intake related to nausea,poor appetite as evidenced by meal completion < 25%.  Ongoing, being addressed via TF  GOAL:   Patient will meet greater than or equal to 90% of their needs  Progressing, being addressed via TF  MONITOR:   PO intake,Supplement acceptance,Labs,Weight trends,TF tolerance  REASON FOR ASSESSMENT:   Malnutrition Screening Tool,Consult Enteral/tube feeding initiation and management  ASSESSMENT:   50 year old female with PMH of depression, opiate use, PTSD with reported prior suicide attempts, HTN, COVID positive on 05/10/20, endometriosis, ORIF right ankle 03/28/20. Presented 06/08/20 after cardiac arrest. Pt was intubated 2/16-2/20 and started on CRRT then transitioned to iHD on 06/15/20. Hemodialysis currently on hold due to signs of renal recovery. Pt is on a dysphagia 2 diet with thin liquids but PO intake is poor. Cortrak in place for nutrition support. Admitted to CIR on 3/03.  Noted planned discharge date of 3/12.  Pt continues with Cortrak tube in place and nocturnal tube feeds ordered. Weight trending down. Pt  already with significant weight loss PTA. Will increase nocturnal tube feeding rate at this time.  Will continue with current oral nutrition supplements at this time.  CIR admit weight: 82.5 kg Current weight: 80.9 kg  Current TF: Vital 1.5 @ 55 ml/hr x 12 hours from 1900 to 0700, free water flushes of 50 ml q 6 hours  Meal Completion: 0-10%  Medications reviewed and include: SSI q 4 hours, melatonin, reglan, rena-vit, protonix, klor-con, mylicon, PRN IV zofran  Labs reviewed: hemoglobin 8.6 CBG's: 109-135 x 24 hours  Diet Order:   Diet Order            DIET DYS 3 Room service appropriate? Yes; Fluid consistency: Thin  Diet effective now                 EDUCATION NEEDS:   Not appropriate for education at this time  Skin:  Skin Assessment: Reviewed RN Assessment  Last BM:  06/29/20 small type 4  Height:   Ht Readings from Last 1 Encounters:  06/23/20 5\' 6"  (1.676 m)    Weight:   Wt Readings from Last 1 Encounters:  06/29/20 80.9 kg    BMI:  Body mass index is 28.79 kg/m.  Estimated Nutritional Needs:   Kcal:  1800-2000  Protein:  90-110 grams  Fluid:  1.8-2.0 L    Gustavus Bryant, MS, RD, LDN Inpatient Clinical Dietitian Please see AMiON for contact information.

## 2020-06-30 NOTE — Progress Notes (Signed)
Speech Language Pathology Daily Session Note  Patient Details  Name: [name] MRN: [id number] Date of Birth: 1970/09/30  Today's Date: 06/30/2020 SLP Individual Time: 1500-1530 SLP Individual Time Calculation (min): 30 min  Short Term Goals: Week 1: SLP Short Term Goal 1 (Week 1): Patient will tolerate Dys 3, thin liquids with minimal instances of coughing/throat clearing and with supervisionA. SLP Short Term Goal 2 (Week 1): Patient will maintain attention to functional basic level tasks for increments of 2-3 minutes with modA. SLP Short Term Goal 3 (Week 1): Patient will respond to open ended questions at phrase level with modA cues to expand/elaborate. SLP Short Term Goal 4 (Week 1): Patient will consume regular texture PO's with minimal difficulty/delay in mastication and minimal instances of cough/throat clear.  Skilled Therapeutic Interventions:   Patient seen to address cognitive-linguistic goals. Patient was agreeable to working with SLP as she did not have to get out of bed. She required less cues to interact and more fully respond to questions than was observed during initial evaluation. She continues to have a very flat affect but stated she was happy about going home Saturday. Patient was able to describe a limited amount of details from PT and OT sessions today, with modA cues to fully respond. Patient did recall some items on list of her 'safety plan' that was developed by nursing, but she denies using call bell too frequently. When SLP discussed incident surrounding her hospitalization, patient stated "they embellish my story". Cortrak has been discharged already. Patient continues to benefit from skilled SLP intervention to maximize cognitive-linguistic skills prior to discharge.  Pain Pain Assessment Pain Scale: 0-10 Pain Score: 2  Faces Pain Scale: No hurt  Therapy/Group: Individual Therapy  Sonia Baller, MA, CCC-SLP Speech Therapy

## 2020-06-30 NOTE — Progress Notes (Signed)
PROGRESS NOTE   Subjective/Complaints: Patient seen laying in bed this morning.  She states that she slept well overnight, but is still sleepy.  Per nursing, patient constantly hitting call bell after her husband left this morning.  ROS: + Nausea, shortness of breath (baseline), unchanged.  Appears to deny CP, diarrhea.   Objective:   No results found. No results for input(s): WBC, HGB, HCT, PLT in the last 72 hours. Recent Labs    06/29/20 0628  NA 137  K 3.9  CL 106  CO2 21*  GLUCOSE 161*  BUN 7  CREATININE 0.94  CALCIUM 8.6*    Intake/Output Summary (Last 24 hours) at 06/30/2020 1031 Last data filed at 06/30/2020 0800 Gross per 24 hour  Intake 160 ml  Output --  Net 160 ml        Physical Exam: Vital Signs Blood pressure (!) 146/89, pulse 100, temperature 98.3 F (36.8 C), temperature source Oral, resp. rate 20, height 5\' 6"  (1.676 m), weight 80.9 kg, last menstrual period 06/03/2018, SpO2 94 %. Constitutional: No distress . Vital signs reviewed. HENT: Normocephalic.  Atraumatic.  + NG. Eyes: EOMI. No discharge. Cardiovascular: No JVD.  Tachycardia.  Regular rhythm. Respiratory: Normal effort.  No stridor.  Bilateral clear to auscultation. GI: Non-distended.  BS +. Skin: Warm and dry.  Intact. Psych: Normal mood.  Normal behavior. Musc: No edema in extremities.  No tenderness in extremities. Neurologic: Alert Motor: Appears to be 4/5 throughout, stable  Assessment/Plan: 1. Functional deficits which require 3+ hours per day of interdisciplinary therapy in a comprehensive inpatient rehab setting.  Physiatrist is providing close team supervision and 24 hour management of active medical problems listed below.  Physiatrist and rehab team continue to assess barriers to discharge/monitor patient progress toward functional and medical goals  Care Tool:  Bathing    Body parts bathed by patient: Right  arm,Left arm,Chest,Abdomen,Face,Left upper leg,Right upper leg,Right lower leg,Left lower leg,Front perineal area,Buttocks   Body parts bathed by helper: Front perineal area,Buttocks     Bathing assist Assist Level: Contact Guard/Touching assist     Upper Body Dressing/Undressing Upper body dressing   What is the patient wearing?: Pull over shirt    Upper body assist Assist Level: Supervision/Verbal cueing    Lower Body Dressing/Undressing Lower body dressing      What is the patient wearing?: Incontinence brief,Pants     Lower body assist Assist for lower body dressing: Minimal Assistance - Patient > 75%     Toileting Toileting    Toileting assist Assist for toileting: Contact Guard/Touching assist     Transfers Chair/bed transfer  Transfers assist     Chair/bed transfer assist level: Contact Guard/Touching assist     Locomotion Ambulation   Ambulation assist      Assist level: Minimal Assistance - Patient > 75% Assistive device: Walker-rolling Max distance: 35   Walk 10 feet activity   Assist     Assist level: Minimal Assistance - Patient > 75% Assistive device: Walker-rolling   Walk 50 feet activity   Assist    Assist level: Minimal Assistance - Patient > 75% Assistive device: Walker-rolling    Walk 150 feet  activity   Assist Walk 150 feet activity did not occur: Safety/medical concerns         Walk 10 feet on uneven surface  activity   Assist Walk 10 feet on uneven surfaces activity did not occur: Safety/medical concerns         Wheelchair     Assist Will patient use wheelchair at discharge?: No Type of Wheelchair: Manual    Wheelchair assist level: Supervision/Verbal cueing Max wheelchair distance: 15    Wheelchair 50 feet with 2 turns activity    Assist            Wheelchair 150 feet activity     Assist          Medical Problem List and Plan: 1.Decreased functional mobilitysecondary to  acute hypoxic respiratory failure with compromised airway in the setting of cardiac arrest/CPR and aspiration pneumonitis. Extubated 2/20- with likely anoxic brain injury due to 3 minutes minimum of asystole  Continue CIR 2. Antithrombotics: -DVT/anticoagulation: Continue Subcutaneous heparin -antiplatelet therapy: N/A 3. Pain Management:Tylenol as needed-  NO ZANAFLEX or other muscle relaxants- pt has specific recent hx of addiction to Zanaflex. 4. Mood/PTSD with anxiety:   Prozac 40 mg daily.     Will avoid benzos due to history of addiction   Prazosin started on 3/8   No changes per psych   Stable -antipsychotic agents: Abilify 5 mg daily 5. Neuropsych: This patientis?capable of making decisions onherown behalf. 6. Skin/Wound Care:Routine skin checks 7. Fluids/Electrolytes/Nutrition:Routine in and outs 8. AKI secondary to ischemic ATN following cardiac arrest. CRRT 06/08/2020 transition to hemodialysis 06/15/2020 currently on hold   Follow-up hemodialysis as needed  Cr.  0.94 on 3/9  Continue to monitor 9. Anemia of critical illness. Small hematochezia and stool morning of 06/16/2020. Follow-up gastroenterology services as needed. Continue Protonix  Hb 8.6 on 3/7  Continue to monitor 10. Hypertensionwith new Hypotension.  Vitals:   06/29/20 1949 06/30/20 0305  BP: 133/89 (!) 146/89  Pulse: (!) 106 100  Resp: 20 20  Temp: 99.7 F (37.6 C) 98.3 F (36.8 C)  SpO2: 95% 94%   Decreased proamatine to 5mg  bid, d/ced on 3/7  Added low dose coreg, increased on 3/9  See #4  Mildly elevated on 3/10 11.  Tube feed associated hypoglycemia on prediabetes. Hemoglobin A1c 6.1. SSI  Mildly elevated on 3/10 12. Dysphagia.  Dysphagia #3 thin liquid diet.   Advance diet as tolerated  Limited p.o. intake-strong behavioral component, will plan to DC NG tube and follow. 13. Right ankle fracture status post ORIF 03/28/2020. Weightbearing as toleratednow.  Follow-up per Dr. Mardelle Matte 14. Substance abuse. UDS positive for amphetamines. Not prescribed. Outpatient follow-up- also per husband abusing 64 mg Zanaflexdaily prior to [place]  Husband would like to avoid pain meds/muscle relaxers 15. Recent COVID-19 infection. Tested +1/18. Patient did not require treatment or hospitalization. Outside the window of isolation. 16. Class I obesity. BMI 30.74. Dietary follow-up 17. Bowel and bladder incontinence- new since hospitalization-  18. Chronic nausea- Reglan changed to PO  Very extensive GI w/u for similar sx in 2020 which was negative   Added mylicon, reduced TF rate, changed to p.o. formula-discussed with dietary  KUB personally reviewed, showing the replacement of NG tube, otherwise unremarkable  Significant dry heaving as well  Strong behavioral component, confirmed across all disciplines  Abdominal CT from 1/22 relatively unremarkable  Will discuss with GI in anticipation for discharge. 19. Hypokalemia:   K+ 3.9 on 3/9  Daily supplement initiated,  decreased on 3/10 20. Tachycardia: continue to monitor   Cont Coreg, increase as tolerated  See #10, #4 21. Low protein: edited diet order to include high protein foods.  22.  Sleep disturbance  Melatonin started on 3/8  Benefit with husband spending night.  Improving  LOS: 7 days A FACE TO FACE EVALUATION WAS PERFORMED  [name] Lorie Phenix 06/30/2020, 10:31 AM

## 2020-06-30 NOTE — Progress Notes (Signed)
Patient c/o right  ankle pain when ambulating to bathroom.  Slightly swollen; no redness noted; no warmth noted. Tylenol given at noon and patient was able to get rest. Patient requesting again but not due at this time.  Dan PA made aware of right ankle pain and swelling.Marland Kitchen

## 2020-06-30 NOTE — Progress Notes (Signed)
Occupational Therapy Session Note  Patient Details  Name: [name] MRN: [id number] Date of Birth: June 17, 1970  Today's Date: 06/30/2020 OT Individual Time: 3614-4315 OT Individual Time Calculation (min): 60 min  and Today's Date: 06/30/2020 OT Missed Time: 15 Minutes Missed Time Reason: Patient unwilling/refused to participate without medical reason   Short Term Goals: Week 1:  OT Short Term Goal 1 (Week 1): Pt will be able to ambulate in and out of bathroom with min A with LRAD. OT Short Term Goal 2 (Week 1): Pt will be able to bathe self wtih CGA for balance. OT Short Term Goal 3 (Week 1): Pt will don pants with min A or less. OT Short Term Goal 4 (Week 1): Pt will be able to sit to stand with S to increase independence with LB self care. OT Short Term Goal 5 (Week 1): Pt will be able to toilet with min A.  Skilled Therapeutic Interventions/Progress Updates:    Pt received in bed and agreeable to a shower, she kept saying "I cant wait to leave this hospital, I hate it here".  Had pt sit to EOB and she began to take medication from nursing and then began to cough and pushing her tongue forward almost making herself gag.  Cued pt to take a good swallow and clear her throat. Then the gagging stopped.  Pt was frustrated about being here and anxious to go home, so she actually did better today with her self care and mobility, demonstrating the ability to move about her room with RW with close S.  She does continue to need cues to ensure she is close enough to the sitting surface before sitting as she tends to sit too fast and is not always close enough to make her stand to sit secure.  She continues to have low standing tolerance of 30 seconds, often saying "I need to sit' without showing signs of fatigue.    She did ambulate to sink for grooming, but wanted to sit in wc.  Continues to refuse oral care, but engages in hair care.    Pt completed self care and wanted to go back to bed.  Reminded pt we had 15 minutes left.  Discussed the activities we could do.  Pt said, " I dont want to see you again ever."  Explained that I would be treating her tomorrow and pt responded "well I wont work with you".  Pt in bed with alarm on, ice water in her cup. Call bell in reach.    Therapy Documentation Precautions:  Precautions Precautions: Fall Precaution Comments: cortrak Restrictions Weight Bearing Restrictions: No RLE Weight Bearing: Weight bearing as tolerated General: General OT Amount of Missed Time: 15 Minutes   Pain: Pain Assessment Pain Score: 7  Pain Type: Acute pain Pain Location: Knee Pain Orientation: Right;Left Pain Descriptors / Indicators: Aching Pain Frequency: Occasional Pain Onset: On-going Pain Intervention(s): Medication (See eMAR) ADL: ADL Grooming: Supervision/safety Where Assessed-Grooming: Sitting at sink Upper Body Bathing: Supervision/safety Where Assessed-Upper Body Bathing: Shower Lower Body Bathing: Supervision/safety Where Assessed-Lower Body Bathing: Shower Upper Body Dressing: Supervision/safety Where Assessed-Upper Body Dressing: Other (Comment) (sitting on toilet) Lower Body Dressing: Supervision/safety Where Assessed-Lower Body Dressing:  (sitting on toilet) Toileting: Supervision/safety Where Assessed-Toileting: Glass blower/designer: Close supervision Toilet Transfer Method: Arts development officer: Grab bars,Raised Counselling psychologist: Close supervision Social research officer, government Method: Radiographer, therapeutic: Transfer tub bench,Grab bars   Therapy/Group: Individual Therapy  SAGUIER,JULIA  06/30/2020, 12:19 PM

## 2020-07-01 DIAGNOSIS — Z8659 Personal history of other mental and behavioral disorders: Secondary | ICD-10-CM

## 2020-07-01 MED ORDER — FERROUS SULFATE 325 (65 FE) MG PO TABS: 325.0000 mg | ORAL_TABLET | Freq: Every day | ORAL | 3 refills | Status: DC

## 2020-07-01 MED ORDER — FLUOXETINE HCL 40 MG PO CAPS: 40.0000 mg | ORAL_CAPSULE | Freq: Every day | ORAL | 3 refills | Status: DC

## 2020-07-01 MED ORDER — OMEPRAZOLE 20 MG PO CPDR: 20.0000 mg | DELAYED_RELEASE_CAPSULE | Freq: Every day | ORAL | 0 refills | Status: DC

## 2020-07-01 MED ORDER — RENA-VITE PO TABS: 1.0000 | ORAL_TABLET | Freq: Every day | ORAL | 0 refills | Status: DC

## 2020-07-01 MED ORDER — METOCLOPRAMIDE HCL 10 MG PO TABS
10.0000 mg | ORAL_TABLET | Freq: Three times a day (TID) | ORAL | 0 refills | Status: DC
Start: 2020-07-01 — End: 2021-08-29

## 2020-07-01 MED ORDER — ACETAMINOPHEN 325 MG PO TABS: 650.0000 mg | ORAL_TABLET | Freq: Four times a day (QID) | ORAL | Status: DC | PRN

## 2020-07-01 MED ORDER — CARVEDILOL 6.25 MG PO TABS: 6.2500 mg | ORAL_TABLET | Freq: Two times a day (BID) | ORAL | 0 refills | Status: DC

## 2020-07-01 MED ORDER — MELATONIN 300 MCG PO TABS: 1.5000 mg | ORAL_TABLET | Freq: Every day | ORAL | 0 refills | Status: DC

## 2020-07-01 MED ORDER — PRAZOSIN HCL 2 MG PO CAPS: 2.0000 mg | ORAL_CAPSULE | Freq: Every day | ORAL | 0 refills | Status: DC

## 2020-07-01 MED ORDER — ARIPIPRAZOLE 5 MG PO TABS: 5.0000 mg | ORAL_TABLET | Freq: Every day | ORAL | 0 refills | Status: DC

## 2020-07-01 NOTE — Consult Note (Addendum)
Neuropsychological Consultation   Patient:   [name]   DOB:   Jun 11, 1970  MR Number:  [id number]  Location:  [facility] [address] [facility] [place] [id number] [place] [address] Dept: [place]: [phone]           Date of Service:   07/01/2020  Start Time:   10 AM End Time:   10:30 AM  Provider/Observer:  Ilean Skill, Psy.D.       Clinical Neuropsychologist       Billing Code/Service: [phone]  Chief Complaint:    [name] 50 year old [name] with history of depression, prior suicide attempts, opiate use, substance abuse including using her husband's medications, PTSD, previous diagnosis of depression and bipolar disorder with prior inpatient hospitalizations following suicide attempts.  Patient has been maintained on Prozac, Lamictal as well as Risperdal.  Patient also has hypertension, was COVID-19 +1/18, has endometriosis with significant pain, and ankle fracture with ORIF 03/28/2020 but has been weightbearing as tolerated.  Patient has been described as essentially staying in bed most of the time following return home after ankle surgery.  Patient presented on 06/08/2020 after reportedly falling out of bed at some point during the night and family leaving her there to sleep on the floor.  When they later checked on her she was unresponsive.  First responders found her to be pulseless and asystolic.  Patient was intubated and received CPR for 30 minutes as well as epi with ROSC.  Cranial CT scan was negative.  Urine drug screen was positive for amphetamines and alcohol negative.  However, patient denies ever using amphetamines became very perturbed when asked about this.  Earlier in her hospitalization the patient had a psychiatric consult due to history of depression, PTSD and prior suicide attempts.  At that time of the consultation patient denied any significant depressive symptoms and denied any  suicidal ideation.  Reason for Service:  The patient was referred for neuropsychological consultation due to coping and adjustment issues in the setting of significant psychiatric history including PTSD, bipolar disorder/depression, opiate dependence and anxiety.  Below see HPI for the current admission.  [name] with history of depression, opiate use, PTSDwith reported prior suicide attempts maintained on Prozac, Lamictal as well as Risperdal,, hypertension, Covid +1/18, endometriosis, ORIF right ankle 03/28/2020 and is weightbearing as toleratedand has been rather sedentary since her fracture.Per chart review patient lives with spouse and 2 adult aged children. 1 level home 2 steps to entry. Presented 06/08/2020 after reportedly falling out of the bed at some point during the night and family let her sleep on the floor when they later checked on her she was unresponsive. First responders found her to be pulseless and asystolic. She was intubated with a King airway and received CPR x3 minutes and epi x1 with ROSC. Immediately post ROSC her EKG was concerning for STEMI but subsequent EKG were improved. Cranial CT scan negative. Chemistries revealed potassium 6.5 chloride 89 glucose 180 BUN 214 creatinine 24.91 from baseline 0.83, WBC 16,200 hemoglobin 10.6, troponin 70, urine drug screen positive amphetamines, alcohol negative, lactic acid 4.4, CK 5564, magnesium 2.7hemoglobin A1c 6.1. Renal ultrasound unremarkable. Echocardiogram with ejection fraction of 55 to 60% no wall motion abnormalities. She remained intubated 2/16-2/20. Started on CRRT and transition to IHD on 06/15/2020 followed by renal services with latest renal function much improved creatinine 2.24-1.81and her hemodialysis is currently on hold as  she is showing renal recovery. Chest x-ray suspicious for aspiration pneumonia completing course of Rocephin. Gastroenterology consulted  06/16/2020 for nonspecific lower abdominal pain and some bright red blood per rectum and hemoglobin dipping to 7.1 overnight day.. It was noted patient with extensive GI work-up in 6789 for IDA and cyclic vomiting to include EGD showing non-HPyloric gastritis, colonoscopy benign rectal erythema and VCE 1 small nonbleeding distal AVM. She was transfused 1 unit packed red blood cells gastroenterology services felt no further work-up was needed with close monitoring of hemoglobin and hematocrit. Her hemoglobin has stabilized 8.3 and she was cleared to begin subcutaneous heparin for DVT prophylaxis. Her cardiac status remained stable followed by Dr. Gwenlyn Found of cardiology services. She was maintained on midodrine for blood pressure. Psychiatry consulted for history of depression PTSD with prior suicide attempts and currently remains on Prozac as prior to admission with the addition of Abilify. She is tolerating a dysphagia #2 thin liquid dietand currently receiving nasogastric tube feeds for nutritional support. Therapy evaluations completed due to patient decreased functional ability was admitted for a comprehensive rehab program.  Current Status:  Upon entering the room, the patient was laying in the bed in a very slightly inclined position.  She was displaying indications of pain with attempts at controlling her breathing patterns and reported that she was experiencing a lot of pain in her ankle.  The patient was avoidant and very resistant in her interactions with myself.  When asked about prior suicide attempts and current suicidal ideation the patient displayed some irritation and wanted to know why I was bringing up things from the past.  The patient did freely answer questions for the most part but these were limited and she was resistant to going into any detail.  The patient did deny significant depression symptoms currently and denied any suicidal ideation.  The patient wanted to know if she was going home  today which is a common refrain from the patient even though she has planned discharge tomorrow.  The patient had difficulty describing any specific plan for how she would continue to make progress going forward other than that she was going to continue to work on her recovery.  The patient did clearly state that she was not suicidal and had no suicidal ideation.  However, there are likely some deep underlying personality variables that play a role not only on her current CIR admission but also going back historically.  The patient has a behavioral pattern on the unit of being very manipulative and resistant to interventions and then immediately seeking out care.  In fact after essentially asking me to leave stating that she was in pain etc. by the time I got to the nurses station she was already calling for request from nursing to provide her some type of ongoing care.  This appears to be an ongoing pattern for the patient.  Behavioral Observation: [name]  presents as a 50 y.o.-year-old Right Caucasian [name] who appeared her stated age. her dress was Appropriate and she was Well Groomed and her manners were Appropriate, inappropriate to the situation.  her participation was indicative of Inattentive, Monopolizing and Resistant behaviors.  There were physical disabilities noted.  she displayed an inappropriate level of cooperation and motivation.     Interactions:    Minimal Inattentive, Redirectable and Resistant  Attention:   abnormal and attention span appeared shorter than expected for age  Memory:   abnormal; remote memory intact, recent memory impaired  Visuo-spatial:  not examined  Speech (Volume):  normal  Speech:   normal; normal  Thought Process:  Circumstantial and Tangential  Though Content:  Rumination; not suicidal and not homicidal  Orientation:   person, place, time/date and situation  Judgment:   Poor  Planning:   Poor  Affect:    Anxious, Irritable and  Resistant  Mood:    Anxious  Insight:   Shallow  Intelligence:   low  Substance Use:  There is a documented history of prescription drug abuse confirmed by the patient. and family.    Medical History:   Past Medical History:  Diagnosis Date  . Anxiety   . Arthritis    "joints ache all over" (10/15/2014)  . Barrett's esophagus   . Bulging lumbar disc   . Chronic lower back pain   . DDD (degenerative disc disease), cervical   . Depression   . Drug-seeking behavior   . Headache    "weekly" (10/15/2014)  . Hyperlipemia   . Hypertension   . PTSD (post-traumatic stress disorder)   . Skin cancer    "had them cut off my arms; don't know what kind"         Patient Active Problem List   Diagnosis Date Noted  . History of depression   . Sleep disturbance   . Anxiety state   . N&V (nausea and vomiting)   . Substance abuse ([place])   . Hypertension   . Anemia of chronic disease   . Acute metabolic encephalopathy 21/30/8657  . Hematochezia   . Acute blood loss anemia   . Acute respiratory failure with hypoxia ([place])   . Non-traumatic rhabdomyolysis   . Cardiac arrest ([place]) 06/08/2020  . AKI (acute kidney injury) ([place]) 05/10/2020  . Fracture of ankle, bimalleolar, right, closed 03/28/2020  . Iron deficiency 05/05/2019  . Symptomatic anemia 05/05/2019  . Nausea & vomiting 12/26/2018  . Essential hypertension 12/26/2018  . ARF (acute renal failure) ([place]) 12/26/2018  . Intractable nausea and vomiting 11/28/2018  . Bipolar affective disorder ([place]) 11/28/2018  . Acute gastroenteritis 11/19/2015  . C. difficile colitis   . SIRS (systemic inflammatory response syndrome) ([place]) 01/11/2015  . Cellulitis 01/03/2015  . Opiate withdrawal ([place]) 12/18/2014  . Herpes simplex virus type 1 (HSV-1) dermatitis   . Sacral fracture ([place]) 12/12/2014  . Leukocytosis 12/12/2014  . Chronic lower back pain 12/12/2014  . Sacral fracture, closed ([place]) 12/12/2014  . Nausea with vomiting   .  Intractable abdominal pain 10/15/2014  . Abdominal pain 10/15/2014  . Hypokalemia 09/04/2014  . Sepsis ([place]) 09/01/2014  . Abdominal pain, generalized 09/01/2014  . PTSD (post-traumatic stress disorder) 09/01/2014  . Endometriosis 09/01/2014  . Sinus tachycardia 09/01/2014  . Lactic acidosis 09/01/2014  . Severe sepsis ([place]) 09/01/2014              Abuse/Trauma History/Psychiatric History: Patient has been diagnosed with PTSD although was unable to get into details regarding her history of traumatic experiences.  Patient also has been diagnosed on various times with bipolar disorder, major depressive disorder and anxiety disorder.  There are also likely some significant underlying personality traits that are quite challenging historically.  Family Med/Psych History:  Family History  Problem Relation Age of Onset  . Breast cancer Mother   . Diabetes Mother   . Breast cancer Maternal Grandmother   . Breast cancer Paternal Grandmother   . Colon polyps Paternal Grandmother   . Colon cancer Neg Hx   . Esophageal cancer Neg  Hx   . Rectal cancer Neg Hx   . Stomach cancer Neg Hx     Risk of Suicide/Violence: low patient clearly and emphatically denies any current suicidal ideation.  She admits to previous suicide attempts that it is possible that even those were likely to be manipulative types of behaviors rather than an attempt to completely end her life.  However, it is clear that the patient is currently denying any suicidal ideation and she appears highly motivated for discharge to get home.  Impression/DX:  [name] 50 year old [name] with history of depression, prior suicide attempts, opiate use, substance abuse including using her husband's medications, PTSD, previous diagnosis of depression and bipolar disorder with prior inpatient hospitalizations following suicide attempts.  Patient has been maintained on Prozac, Lamictal as well as Risperdal.  Patient also has hypertension,  was COVID-19 +1/18, has endometriosis with significant pain, and ankle fracture with ORIF 03/28/2020 but has been weightbearing as tolerated.  Patient has been described as essentially staying in bed most of the time following return home after ankle surgery.  Patient presented on 06/08/2020 after reportedly falling out of bed at some point during the night and family leaving her there to sleep on the floor.  When they later checked on her she was unresponsive.  First responders found her to be pulseless and asystolic.  Patient was intubated and received CPR for 30 minutes as well as epi with ROSC.  Cranial CT scan was negative.  Urine drug screen was positive for amphetamines and alcohol negative.  However, patient denies ever using amphetamines became very perturbed when asked about this.  Earlier in her hospitalization the patient had a psychiatric consult due to history of depression, PTSD and prior suicide attempts.  At that time of the consultation patient denied any significant depressive symptoms and denied any suicidal ideation.  Upon entering the room, the patient was laying in the bed in a very slightly inclined position.  She was displaying indications of pain with attempts at controlling her breathing patterns and reported that she was experiencing a lot of pain in her ankle.  The patient was avoidant and very resistant in her interactions with myself.  When asked about prior suicide attempts and current suicidal ideation the patient displayed some irritation and wanted to know why I was bringing up things from the past.  The patient did freely answer questions for the most part but these were limited and she was resistant to going into any detail.  The patient did deny significant depression symptoms currently and denied any suicidal ideation.  The patient wanted to know if she was going home today which is a common refrain from the patient even though she has planned discharge tomorrow.  The patient  had difficulty describing any specific plan for how she would continue to make progress going forward other than that she was going to continue to work on her recovery.  The patient did clearly state that she was not suicidal and had no suicidal ideation.  However, there are likely some deep underlying personality variables that play a role not only on her current CIR admission but also going back historically.  The patient has a behavioral pattern on the unit of being very manipulative and resistant to interventions and then immediately seeking out care.  In fact after essentially asking me to leave stating that she was in pain etc. by the time I got to the nurses station she was already calling for request from  nursing to provide her some type of ongoing care.  This appears to be an ongoing pattern for the patient.  Disposition/Plan:  Patient is to be discharged tomorrow and has been improving from her overall acute metabolic issues and and acute medical issues although she continues to have long-term issues that need to be dealt with.  The patient is to follow-up with psychiatry outpatient.  Diagnosis:    Acute metabolic encephalopathy - Plan: Ambulatory referral to Physical Medicine Rehab  N&V (nausea and vomiting) - Plan: DG Abd 1 View, DG Abd 1 View         Electronically Signed   _______________________ Ilean Skill, Psy.D. Clinical Neuropsychologist

## 2020-07-01 NOTE — Progress Notes (Signed)
Occupational Therapy Session Note  Patient Details  Name: [name] MRN: [id number] Date of Birth: Feb 24, 1971  Today's Date: 07/01/2020 OT Individual Time: 9735-3299 OT Individual Time Calculation (min): 15 min  and Today's Date: 07/01/2020 OT Missed Time: 15 Minutes Missed Time Reason: Patient unwilling/refused to participate without medical reason   Short Term Goals: Week 1:  OT Short Term Goal 1 (Week 1): Pt will be able to ambulate in and out of bathroom with min A with LRAD. OT Short Term Goal 2 (Week 1): Pt will be able to bathe self wtih CGA for balance. OT Short Term Goal 3 (Week 1): Pt will don pants with min A or less. OT Short Term Goal 4 (Week 1): Pt will be able to sit to stand with S to increase independence with LB self care. OT Short Term Goal 5 (Week 1): Pt will be able to toilet with min A.  Skilled Therapeutic Interventions/Progress Updates:    Pt received in bed and stated she wanted a shower. Explained that this was a short 30 min session and we would not have time for a full shower (pt took a shower yesterday and needed 45 min for b/d).  Offered to her to freshen up with warm wash cloths and change clothing but pt said "I want you to leave, I dont want to work with you". Gave her a minute and then offered her to do UE exercises from bed. She agreed. Obtained a 2 lb dowel bar and had pt work on a/arom of shoulders with chest presses and overhead stretches.  After 2 minutes, pt stated "I am done. I am not doing anything else!" Pt in room with all needs met, alarm set.   Therapy Documentation Precautions:  Precautions Precautions: Fall Precaution Comments: cortrak Restrictions Weight Bearing Restrictions: No RLE Weight Bearing: Weight bearing as tolerated Other Position/Activity Restrictions: R ankle fx in 12/21 s/p ORIF - WBAT    Vital Signs: Therapy Vitals Temp: 98 F (36.7 C) Pulse Rate: (!) 109 Resp: 18 BP: 139/79 Patient Position (if  appropriate): Lying Oxygen Therapy SpO2: 92 % O2 Device: Room Air Pain: Pain Assessment Pain Scale: 0-10 Pain Score: 0-No pain ADL: ADL Grooming: Supervision/safety Where Assessed-Grooming: Sitting at sink Upper Body Bathing: Supervision/safety Where Assessed-Upper Body Bathing: Shower Lower Body Bathing: Supervision/safety Where Assessed-Lower Body Bathing: Shower Upper Body Dressing: Supervision/safety Where Assessed-Upper Body Dressing: Other (Comment) (sitting on toilet) Lower Body Dressing: Supervision/safety Where Assessed-Lower Body Dressing:  (sitting on toilet) Toileting: Supervision/safety Where Assessed-Toileting: Glass blower/designer: Close supervision Toilet Transfer Method: Arts development officer: Grab bars,Raised Counselling psychologist: Close supervision Social research officer, government Method: Radiographer, therapeutic: Transfer tub bench,Grab bars   Therapy/Group: Individual Therapy  [place] 07/01/2020, 8:30 AM

## 2020-07-01 NOTE — Progress Notes (Signed)
Occupational Therapy Session Note  Patient Details  Name: [name] MRN: [id number] Date of Birth: 1970-07-08  Today's Date: 07/01/2020 OT Individual Time: 1937-9024 OT Individual Time Calculation (min): 46 min    Short Term Goals: Week 1:  OT Short Term Goal 1 (Week 1): Pt will be able to ambulate in and out of bathroom with min A with LRAD. OT Short Term Goal 2 (Week 1): Pt will be able to bathe self wtih CGA for balance. OT Short Term Goal 3 (Week 1): Pt will don pants with min A or less. OT Short Term Goal 4 (Week 1): Pt will be able to sit to stand with S to increase independence with LB self care. OT Short Term Goal 5 (Week 1): Pt will be able to toilet with min A.  Skilled Therapeutic Interventions/Progress Updates:    Pt in bed to start session, initially refusing to participate.  Finally she agreed to work stating "I'll do this therapy with you but I'm not doing any other ones."  Pt redirected that she was going home tomorrow, which she is aware of and that according to her contract in the room, she has agreed to participate in all therapies.  She was able to transfer from supine to sit EOB with supervision.  She declined working on shower but agreed to oral hygiene at the sink as well as combing her hair.  She complete short distance mobility with use of the RW and min guard assist over to the sink where the wheelchair was brought up behind her.  She used sponge with mouthwash for cleaning her mouth and also swished out with water at setup only.  She then combed her hair at the same level when presented with a comb and told that it was sticking up in the back.  Next, therapist took her down to the dayroom where she engaged in use of the Wii.  Pt initially stating she would not stand to play but therapist was able to negotiate two stands with supervision using the RW for duration of 3-4 mins while engaged in use of the LUE. The rest of the time, she completed task in sitting with min  to mod assist for coordination of the LUE.  Upon completion pt declined further activity and requested to go back to the room.  Therapist assisted with transfer back to the bed as pt continued to refuse to sit up for next session.  Min guard for transfer back to the bed using the RW for support and supervision for transfer to supine.  Call button and phone in reach with safety alarm in place.    Therapy Documentation Precautions:  Precautions Precautions: Fall Precaution Comments: no longer has cortrak Restrictions Weight Bearing Restrictions: No RLE Weight Bearing: Weight bearing as tolerated Other Position/Activity Restrictions: R ankle fx in 12/21 s/p ORIF - WBAT General: General OT Amount of Missed Time: 15 Minutes  Pain: Pain Assessment Pain Scale: Faces Faces Pain Scale: Hurts a little bit Pain Type: Acute pain Pain Location: Ankle Pain Orientation: Right Pain Descriptors / Indicators: Discomfort Pain Onset: With Activity Pain Intervention(s): Repositioned;Distraction ADL: See Care Tool Section for some details of mobility and selfcare  Therapy/Group: Individual Therapy  MCGUIRE,JAMES OTR/L 07/01/2020, 1:51 PM

## 2020-07-01 NOTE — Progress Notes (Signed)
Speech Language Pathology Discharge Summary  Patient Details  Name: [name] MRN: [id number] Date of Birth: [birth year]  Today's Date: 07/01/2020 SLP Individual Time: 4818-5631 SLP Individual Time Calculation (min): 30 min   Skilled Therapeutic Interventions: Patient seen for skilled ST session focusing on cognitive goals and patient's awareness to deficits. Patient did acknowledge "I wasn't very receptive" (towards therapy). She stated that biggest challenge for progressing at home will be "sticking to it" (home health PT). SLP encouraged patient to participate and try to be more active than she has previously. Swallow function seems to be [facility] as patient has had Cortrak removed several days ago and has not had any documented difficulties with PO intake, aside from limited amount of intake overall.     Patient has met 6 of 8 long term goals.  Patient to discharge at overall Supervision;Min level.  Reasons goals not met: Patient with overall poor participation.   Clinical Impression/Discharge Summary: Patient met 6/8 LTG's which were conservatively set secondary to anticipation of poor participation overall. Patient did demonstrate mild improvement in her participation as well as her willingness to respond to and elaborate upon answers to open-ended questions. She is currently at supervision to [facility] level for basic level functional problem solving and although her memory does not appear to be significantly impaired as compared to her likely baseline, she will require 24 supervision and assistance with ADL's for safety. SLP anticipates that when home after discharge, patient is very likely to return to old habits and sedentary lifestyle. [place] SLP not recommended at this time, however it is strongly encouraged that patient's family is diligent about making sure patient is participating in therapies and being as physically and mentally active as she safely can.  Care Partner:  Caregiver Able to  Provide Assistance: Yes  Type of Caregiver Assistance: Physical;Cognitive  Recommendation:  None      Equipment: None   Reasons for discharge: Discharged from hospital   Patient/Family Agrees with Progress Made and Goals Achieved: Yes    Sonia Baller, MA, CCC-SLP Speech Therapy

## 2020-07-01 NOTE — Plan of Care (Signed)
  Problem: Consults Goal: RH BRAIN INJURY PATIENT EDUCATION Description: Description: See Patient Education module for eduction specifics Outcome: Progressing   Problem: RH BOWEL ELIMINATION Goal: RH STG MANAGE BOWEL WITH ASSISTANCE Description: STG Manage Bowel with mod Assistance. Outcome: Progressing   Problem: RH BLADDER ELIMINATION Goal: RH STG MANAGE BLADDER WITH ASSISTANCE Description: STG Manage Bladder With mod Assistance Outcome: Progressing   Problem: RH SAFETY Goal: RH STG ADHERE TO SAFETY PRECAUTIONS W/ASSISTANCE/DEVICE Description: STG Adhere to Safety Precautions With mod Assistance/Device. Outcome: Progressing   Problem: RH COGNITION-NURSING Goal: RH STG USES MEMORY AIDS/STRATEGIES W/ASSIST TO PROBLEM SOLVE Description: STG Uses Memory Aids/Strategies With Assistance to Problem Solve with mod assist Outcome: Progressing   Problem: RH PAIN MANAGEMENT Goal: RH STG PAIN MANAGED AT OR BELOW PT'S PAIN GOAL Description: Less than 4 Outcome: Progressing   Problem: RH KNOWLEDGE DEFICIT BRAIN INJURY Goal: RH STG INCREASE KNOWLEDGE OF SELF CARE AFTER BRAIN INJURY Description: Patient/family will be able to describe health care for patient following rehab with cues/handouts Outcome: Progressing

## 2020-07-01 NOTE — Progress Notes (Signed)
Inpatient Rehabilitation Care Coordinator Discharge Note  The overall goal for the admission was met for: DC SAT 3/12  Discharge location: [place]  Length of Stay: Yes-9 DAYS  Discharge activity level: Yes-SUPERVISION WITH CUEING  Home/community participation: Yes  Services provided included: MD, RD, PT, OT, SLP, RN, CM, Pharmacy, Neuropsych and SW  Financial Services: Medicare  Choices offered to/list presented to:YES  Follow-up services arranged: Home Health: [facility]-PT,OT,SP and Patient/Family has no preference for HH/DME agencies HAS ALL NEEDED EQUIPMENT FROM PAST ADMISSIONS  Comments (or additional information):HUSANDWAS HERE DAILY AND PARTICIPATED IN THERAPIES WITH PT. ALLOWS PT TO DO WHAT SHE WANTS TO DO. REPORTS WILL PROVIDE CARE AT HOME. DECLINED OP SUBSTANCE ABUSE SERVICES  Patient/Family verbalized understanding of follow-up arrangements: Yes  Individual responsible for coordination of the follow-up plan: RANDALL-HUSBAND [phone]  Confirmed correct DME delivered: Elease Hashimoto 07/01/2020    [name], [name]

## 2020-07-01 NOTE — Progress Notes (Addendum)
PROGRESS NOTE   Subjective/Complaints: Patient seen sitting up in bed this AM, working with therapies. She states she slept well overnight.  She is eager to go home.  She notes improvement in nausea and appetite.   ROS: Nausea (improving), SOB (baseline). Appears to deny CP, diarrhea.   Objective:   No results found. No results for input(s): WBC, HGB, HCT, PLT in the last 72 hours. Recent Labs    06/29/20 0628  NA 137  K 3.9  CL 106  CO2 21*  GLUCOSE 161*  BUN 7  CREATININE 0.94  CALCIUM 8.6*    Intake/Output Summary (Last 24 hours) at 07/01/2020 0845 Last data filed at 06/30/2020 1825 Gross per 24 hour  Intake 220 ml  Output 1 ml  Net 219 ml        Physical Exam: Vital Signs Blood pressure 139/79, pulse (!) 109, temperature 98 F (36.7 C), resp. rate 18, height 5\' 6"  (1.676 m), weight 80.9 kg, last menstrual period 06/03/2018, SpO2 92 %. Constitutional: No distress . Vital signs reviewed. HENT: Normocephalic.  Atraumatic. +NG.  Eyes: EOMI. No discharge. Cardiovascular: No JVD.  Tachycardia. Regular rhythm.  Respiratory: Normal effort.  No stridor.  Bilateral clear to auscultation. GI: Non-distended.  BS +. Skin: Warm and dry.  Intact. Psych: Normal mood.  Normal behavior. Musc: No edema in extremities.  No tenderness in extremities. Neurologic: Alert Motor: Appears to be 4/5 throughout, unchanged  Assessment/Plan: 1. Functional deficits which require 3+ hours per day of interdisciplinary therapy in a comprehensive inpatient rehab setting.  Physiatrist is providing close team supervision and 24 hour management of active medical problems listed below.  Physiatrist and rehab team continue to assess barriers to discharge/monitor patient progress toward functional and medical goals  Care Tool:  Bathing    Body parts bathed by patient: Right arm,Left arm,Chest,Abdomen,Face,Left upper leg,Right upper  leg,Right lower leg,Left lower leg,Front perineal area,Buttocks   Body parts bathed by helper: Front perineal area,Buttocks     Bathing assist Assist Level: Supervision/Verbal cueing     Upper Body Dressing/Undressing Upper body dressing   What is the patient wearing?: Pull over shirt    Upper body assist Assist Level: Set up assist    Lower Body Dressing/Undressing Lower body dressing      What is the patient wearing?: Incontinence brief,Pants     Lower body assist Assist for lower body dressing: Supervision/Verbal cueing     Toileting Toileting    Toileting assist Assist for toileting: Supervision/Verbal cueing     Transfers Chair/bed transfer  Transfers assist     Chair/bed transfer assist level: Contact Guard/Touching assist     Locomotion Ambulation   Ambulation assist      Assist level: Contact Guard/Touching assist Assistive device: Walker-rolling Max distance: 31ft   Walk 10 feet activity   Assist     Assist level: Contact Guard/Touching assist Assistive device: Walker-rolling   Walk 50 feet activity   Assist    Assist level: Contact Guard/Touching assist Assistive device: Walker-rolling    Walk 150 feet activity   Assist Walk 150 feet activity did not occur: Safety/medical concerns  Walk 10 feet on uneven surface  activity   Assist Walk 10 feet on uneven surfaces activity did not occur: Safety/medical concerns   Assist level: Contact Guard/Touching assist Assistive device: Aeronautical engineer Will patient use wheelchair at discharge?: No Type of Wheelchair: Manual    Wheelchair assist level: Supervision/Verbal cueing Max wheelchair distance: 15    Wheelchair 50 feet with 2 turns activity    Assist            Wheelchair 150 feet activity     Assist          Medical Problem List and Plan: 1.Decreased functional mobilitysecondary to acute hypoxic respiratory  failure with compromised airway in the setting of cardiac arrest/CPR and aspiration pneumonitis. Extubated 2/20- with likely anoxic brain injury due to 3 minutes minimum of asystole  Continue CIR, pt and family edu 2. Antithrombotics: -DVT/anticoagulation: Continue Subcutaneous heparin -antiplatelet therapy: N/A 3. Pain Management:Tylenol as needed-  NO ZANAFLEX or other muscle relaxants- pt has specific recent hx of addiction to Zanaflex. 4. Mood/PTSD with anxiety:   Prozac 40 mg daily.     Will avoid benzos due to history of addiction   Prazosin started on 3/8   No changes per psych   ?Improving -antipsychotic agents: Abilify 5 mg daily 5. Neuropsych: This patientis?capable of making decisions onherown behalf.  Discussed with neuropsych-appreciate support. 6. Skin/Wound Care:Routine skin checks 7. Fluids/Electrolytes/Nutrition:Routine in and outs 8. AKI secondary to ischemic ATN following cardiac arrest. CRRT 06/08/2020 transition to hemodialysis 06/15/2020 currently on hold   Follow-up hemodialysis as needed  Cr.  0.94 on 3/9  Continue to monitor 9. Anemia of critical illness. Small hematochezia and stool morning of 06/16/2020. Follow-up gastroenterology services as needed. Continue Protonix  Hb 8.6 on 3/7  Continue to monitor 10. Hypertensionwith new Hypotension.  Vitals:   06/30/20 1927 07/01/20 0454  BP: (!) 144/92 139/79  Pulse: 97 (!) 109  Resp: 18 18  Temp: 98.1 F (36.7 C) 98 F (36.7 C)  SpO2: 94% 92%   Decreased proamatine to 5mg  bid, d/ced on 3/7  Added low dose coreg, increased on 3/9, increased again on 3/11   See #4 11.  Prediabetes. Hemoglobin A1c 6.1. SSI  Mildly elevated on 3/10 12. Dysphagia.  Dysphagia #3 thin liquid diet.   Advance diet as tolerated  Limited p.o. intake-strong behavioral component 13. Right ankle fracture status post ORIF 03/28/2020. Weightbearing as toleratednow. Follow-up per Dr. Mardelle Matte 14.  Substance abuse. UDS positive for amphetamines. Not prescribed. Outpatient follow-up- also per husband abusing 64 mg Zanaflexdaily prior to [place]  Husband would like to avoid pain meds/muscle relaxers 15. Recent COVID-19 infection. Tested +1/18. Patient did not require treatment or hospitalization. Outside the window of isolation. 16. Class I obesity. BMI 30.74. Dietary follow-up 17. Bowel and bladder incontinence- new since hospitalization-  18. Chronic nausea- Reglan changed to PO  Very extensive GI w/u for similar sx in 2020 which was negative   Added mylicon, reduced TF rate, changed to p.o. formula-discussed with dietary  KUB personally reviewed, showing the replacement of NG tube, otherwise unremarkable  Significant dry heaving as well  Strong behavioral component, confirmed across all disciplines  Abdominal CT from 1/22 relatively unremarkable  Discussed with GI, no changes, plan to follow up as outpatient 19. Hypokalemia:   K+ 3.9 on 3/9  Daily supplement initiated, decreased on 3/10 20. Tachycardia: continue to monitor   Cont Coreg, increase as tolerated  See #10, #  4  Elevated on 3/11 21. Low protein: edited diet order to include high protein foods.  22.  Sleep disturbance  Melatonin started on 3/8  Benefit with husband spending night.  Improving  LOS: 8 days A FACE TO FACE EVALUATION WAS PERFORMED  Ankit Lorie Phenix 07/01/2020, 8:45 AM

## 2020-07-01 NOTE — Progress Notes (Signed)
Occupational Therapy Discharge Summary  Patient Details  Name: [name] MRN: [id number] Date of Birth: March 29, 1971     Patient has met 7 of 10 long term goals due to improved activity tolerance, improved balance, ability to compensate for deficits and improved coordination.  Patient to discharge at overall Supervision level.  Patient's care partner is independent to provide the necessary physical and cognitive assistance at discharge.    Reasons goals not met: 1. Dynamic standing balance goal set at supervision but pt needs min A as she will sit suddenly and move impulsively when she feels tired.  Static standing and minimal dynamic during LB self care is supervision.  This is adequate for discharge. Pt often refused balance exercises stating she was too tired to participate. 2.  Eating and grooming goals set at independent. Pt needs S as she tends to have a gag reflex with eating (premorbid) and has refused oral care consistently.   Recommendation:  Patient will benefit from ongoing skilled OT services in home health setting to continue to advance functional skills in the area of BADL.  Equipment: No equipment provided - family has a BSC and a tub bench  Reasons for discharge: treatment goals met  Patient/family agrees with progress made and goals achieved: Yes  OT Discharge Precautions/Restrictions  Precautions Precaution Comments: no longer has cortrak Restrictions RLE Weight Bearing: Weight bearing as tolerated Other Position/Activity Restrictions: R ankle fx in 12/21 s/p ORIF - WBAT   ADL ADL Eating: Supervision/safety Grooming: Supervision/safety Where Assessed-Grooming: Sitting at sink Upper Body Bathing: Supervision/safety Where Assessed-Upper Body Bathing: Shower Lower Body Bathing: Supervision/safety Where Assessed-Lower Body Bathing: Shower Upper Body Dressing: Supervision/safety Where Assessed-Upper Body Dressing: Other (Comment) (sitting on toilet) Lower  Body Dressing: Supervision/safety Where Assessed-Lower Body Dressing:  (sitting on toilet) Toileting: Supervision/safety Where Assessed-Toileting: Glass blower/designer: Close supervision Toilet Transfer Method: Arts development officer: Grab bars,Raised Counselling psychologist: Close supervision Social research officer, government Method: Radiographer, therapeutic: Transfer tub bench,Grab bars Vision Baseline Vision/History: No visual deficits Patient Visual Report: No change from baseline Vision Assessment?: No apparent visual deficits Perception    Praxis Praxis: Impaired Praxis Impairment Details: Initiation Praxis-Other Comments: Delayed Cognition Overall Cognitive Status: Impaired/Different from baseline Arousal/Alertness: Awake/alert Orientation Level: Oriented X4 Sustained Attention: Impaired Sustained Attention Impairment: Functional basic;Verbal basic Memory: Impaired Memory Impairment: Storage deficit;Retrieval deficit Awareness Impairment: Intellectual impairment Problem Solving: Impaired Problem Solving Impairment: Verbal basic;Functional basic Behaviors: Poor frustration tolerance;Restless;Verbal agitation Safety/Judgment: Impaired Sensation Sensation Light Touch: Appears Intact Hot/Cold: Appears Intact Proprioception: Appears Intact Stereognosis: Appears Intact Coordination Gross Motor Movements are Fluid and Coordinated: No Fine Motor Movements are Fluid and Coordinated: No Coordination and Movement Description: Slowed and delayed - effortful. Improved since date of evaluation Motor  Motor Motor - Skilled Clinical Observations: Blocked movement patterns, rigidity and poor motor initiation Mobility    CGA with RW for ambulating in and out of bathroom for ADL transfers; supervision with stand pivot to toilet and tub bench Trunk/Postural Assessment  Postural Control Righting Reactions: Delayed Protective Responses: delayed and  inadequate  Balance Static Sitting Balance Static Sitting - Level of Assistance: 7: Independent Dynamic Sitting Balance Dynamic Sitting - Level of Assistance: 5: Stand by assistance Static Standing Balance Static Standing - Level of Assistance: 5: Stand by assistance Dynamic Standing Balance Dynamic Standing - Level of Assistance: 4: Min assist Extremity/Trunk Assessment RUE Assessment Active Range of Motion (AROM) Comments: sh flexion to 120 General Strength Comments: 4-/5  LUE Assessment Active Range of Motion (AROM) Comments: sh flexion to 120 General Strength Comments: 4-/5   SAGUIER,JULIA 07/01/2020, 12:38 PM

## 2020-07-01 NOTE — Discharge Instructions (Signed)
Inpatient Rehab Discharge Instructions  [name] Discharge date and time: No discharge date for patient encounter.   Activities/Precautions/ Functional Status: Activity: activity as tolerated Diet: Mechanical soft Wound Care: Routine skin checks Functional status:  ___ No restrictions     ___ Walk up steps independently ___ 24/7 supervision/assistance   ___ Walk up steps with assistance ___ Intermittent supervision/assistance  ___ Bathe/dress independently ___ Walk with walker     _x__ Bathe/dress with assistance ___ Walk Independently    ___ Shower independently ___ Walk with assistance    ___ Shower with assistance ___ No alcohol     ___ Return to work/school ________  Special Instructions: No driving smoking or alcohol    COMMUNITY REFERRALS UPON DISCHARGE:    Home Health:   PT, OT, SP                Agency:[facility]   Phone:[phone]    Medical Equipment/Items Ordered:HAS ALL NEEDED EQUIPMENT FROM PREVIOUS ADMITS                                                    My questions have been answered and I understand these instructions. I will adhere to these goals and the provided educational materials after my discharge from the hospital.  Patient/Caregiver Signature _______________________________ Date __________  Clinician Signature _______________________________________ Date __________  Please bring this form and your medication list with you to all your follow-up doctor's appointments.

## 2020-07-01 NOTE — Discharge Summary (Signed)
Physical Therapy Discharge Summary  Patient Details  Name: [name] MRN: [id number] Date of Birth: [birth year]  Today's Date: 07/01/2020 PT Individual Time: 8756-4332 + 9518-8416 PT Individual Time Calculation (min): 53 min  + 25 min   Patient has met 6 of 8 long term goals due to improved activity tolerance, improved balance, improved postural control, increased strength and ability to compensate for deficits.  Patient to discharge at an ambulatory level Supervision.   Patient's care partner is independent to provide the necessary physical and cognitive assistance at discharge.  Reasons goals not met: Pt unable to meet her 2 goals of gait due to inability to ambulate >164f in a controlled environment (goal set to 1517f. She also requires minA for ambulating in a home simulated environment with goal set to supervision. MinA needed for RW management and safety with navigating tight spaces. Thorough education has been provided during sessions but limited carryover noted due to poor participation and willingness to be receptive to therapist interventions.   Recommendation:  Patient will benefit from ongoing skilled PT services in home health setting to continue to advance safe functional mobility, address ongoing impairments in dynamic standing balance, dual-cog tasks, gait training, and global strengthening in order to minimize fall risk.  Equipment: No equipment provided . Pt owns all needed DME (w/c and RW)  Reasons for discharge: treatment goals met and discharge from hospital  Patient/family agrees with progress made and goals achieved: Yes  PT Discharge Precautions/Restrictions Precautions Precautions: Fall Restrictions Weight Bearing Restrictions: No Other Position/Activity Restrictions: R ankle fx in 12/21 s/p ORIF - WBAT Vital Signs Therapy Vitals Temp: 98 F (36.7 C) Pulse Rate: (!) 109 Resp: 18 BP: 139/79 Patient Position (if appropriate): Lying Oxygen  Therapy SpO2: 92 % O2 Device: Room Air Vision/Perception  Perception Perception: Within Functional Limits Praxis Praxis: Impaired Praxis Impairment Details: Initiation Praxis-Other Comments: Delayed  Cognition Overall Cognitive Status: Impaired/Different from baseline Arousal/Alertness: Awake/alert Orientation Level: Oriented X4 Attention: Focused;Sustained Focused Attention: Impaired Focused Attention Impairment: Functional basic;Verbal basic Sustained Attention: Impaired Sustained Attention Impairment: Functional basic;Verbal basic Memory: Impaired Memory Impairment: Storage deficit;Retrieval deficit Problem Solving: Impaired Problem Solving Impairment: Verbal basic;Functional basic Behaviors: Poor frustration tolerance;Restless;Verbal agitation Safety/Judgment: Impaired Sensation Sensation Light Touch: Appears Intact Hot/Cold: Appears Intact Proprioception: Appears Intact Stereognosis: Not tested Coordination Gross Motor Movements are Fluid and Coordinated: No Fine Motor Movements are Fluid and Coordinated: No Coordination and Movement Description: Slowed and delayed - effortful. Improved since date of evaluation Motor  Motor Motor: Abnormal postural alignment and control;Other (comment) Motor - Skilled Clinical Observations: Blocked movement patterns, rigidity and poor motor initiation  Mobility Bed Mobility Bed Mobility: Rolling Right;Rolling Left;Supine to Sit;Sit to Supine Rolling Right: Independent Rolling Left: Independent Supine to Sit: Supervision/Verbal cueing Sit to Supine: Supervision/Verbal cueing Transfers Transfers: Sit to Stand;Stand to Sit;Stand Pivot Transfers Sit to Stand: Supervision/Verbal cueing Stand to Sit: Supervision/Verbal cueing Stand Pivot Transfers: Contact Guard/Touching assist Stand Pivot Transfer Details: Verbal cues for gait pattern;Verbal cues for safe use of DME/AE;Verbal cues for precautions/safety;Verbal cues for  technique;Verbal cues for sequencing;Manual facilitation for placement Transfer (Assistive device): Rolling walker Locomotion  Gait Ambulation: Yes Gait Assistance: Contact Guard/Touching assist Gait Distance (Feet): 75 Feet Assistive device: Rolling walker Gait Assistance Details: Verbal cues for gait pattern;Verbal cues for safe use of DME/AE;Verbal cues for technique;Verbal cues for precautions/safety;Verbal cues for sequencing;Visual cues/gestures for sequencing;Visual cues for safe use of DME/AE;Manual facilitation for weight shifting Gait Gait: Yes Gait Pattern: Impaired Gait Pattern: Step-through  pattern;Decreased step length - left;Decreased stance time - right;Decreased hip/knee flexion - left;Decreased hip/knee flexion - right;Decreased dorsiflexion - right;Decreased weight shift to right;Poor foot clearance - right;Trunk flexed Gait velocity: decreased Stairs / Additional Locomotion Stairs: No Wheelchair Mobility Wheelchair Mobility: No  Trunk/Postural Assessment  Cervical Assessment Cervical Assessment: Exceptions to [facility] (head forward) Thoracic Assessment Thoracic Assessment: Exceptions to [facility] (rounded shoulders) Lumbar Assessment Lumbar Assessment: Exceptions to [facility] (posterior pelvic tilt) Postural Control Postural Control: Deficits on evaluation Righting Reactions: Delayed  Balance Balance Balance Assessed: Yes Static Sitting Balance Static Sitting - Balance Support: Feet supported;No upper extremity supported Static Sitting - Level of Assistance: 7: Independent Dynamic Sitting Balance Dynamic Sitting - Balance Support: During functional activity;Feet unsupported Dynamic Sitting - Level of Assistance: 5: Stand by assistance Static Standing Balance Static Standing - Balance Support: Bilateral upper extremity supported Static Standing - Level of Assistance: 5: Stand by assistance Dynamic Standing Balance Dynamic Standing - Balance Support: During functional  activity;Bilateral upper extremity supported Dynamic Standing - Level of Assistance: 4: Min assist Extremity Assessment  RUE Assessment Active Range of Motion (AROM) Comments: sh flexion to 120 General Strength Comments: 4-/5 LUE Assessment Active Range of Motion (AROM) Comments: sh flexion to 120 General Strength Comments: 4-/5 RLE Assessment RLE Assessment: Exceptions to [facility] General Strength Comments: Grossly 4/5 (ankle DF/PF 3+/5) LLE Assessment LLE Assessment: Exceptions to [facility] General Strength Comments: Grossly 4/5  Skilled Intervention:  1st session: Pt received supine in bed sleeping at start of session - awakens easily to voice and agreeable (with effort) to therapy session. She reports no pain. RN arriving for morning medications - encouraged patient to sit EOB to take these but she insisted on taking them in bed. "I always lay in bed when I take my med's." Raised HOB to ensure safe passage of medications. She was able to don regular socks with setupA while she lay in bed, via figure-4 technique. She was also able to don her shoes with setupA, although effortful. She was unable to tie them, so required totalA for tieing shoes. Supine<>sit with supervision with use of bed features. Sit<>stand with close supervision to RW and stand<>pivot with CGA and RW to w/c - needing cues for safety approach and sequencing. W/c transport for time management to ortho gym. Performed car transfer, requiring CGA and RW - CGA needed for safety approach to car. Gait x80f with CGA and RW - cues needed for safety with RW management and maintaining straight path as she tends to deviate L - she's aware of this but limited effort at correcting. In ADL kitchen - instructed on finding and locating x3 items - she was able to locate all 3 with min cues but needed minA for balance while navigating kitchen with her RW due to leaving her RW behind or reaching outside BOS unsafely - again providing cues and instructions  for correcting but limited receptiveness.   Pt requesting to return to room frequently during session, often stating "I'm done with you people." She also complained of R ankle pain during session and RN was notified who provided medication during session for pain management.   Returned to room with tOssinekein w/c and patient was refusing to participate any more in therapy. Stand<>pivot with CGA and RW back to her bed from her w/c with cues for safety. Able to perform sit>supine with supervision but needing cues for repositioning. Removed shoes with totalA for time management. She remained supine in bed with bed rails up and  bed alarm on, needs within reach.  2nd session: Pt received supine in bed, sleeping but awakens to voice. Resistive to therapy at start of session but redirectable with effort. Supine<>sit with supervision with bed features. Stand<>pivot transfer with CGA and RW to w/c with cues again needed for safety approach and awareness. W/c transport to ortho gym to perform NMR with BITS system. She completed the Maze Test in supported standing with RW and CGA - x12 errors made but able to complete from start to finish. She also completed Geoboard duplication with CGA and RW for supported standing. She was able to complete up to level 4 before she persistently asked to return to her room and be done with therapies. Attempted redirection and alternate activities but she continued to persist. Returned to her room in her w/c and performed stand<>pivot back to bed with RW and CGA. Sit>supine with supervision with bed features. She remained supine in bed with needs in reach and bed alarm on at end of session. She missed 35 minutes of skilled therapy due to refusal.   [name] P Velera Lansdale PT, DPT 07/01/2020, 7:43 AM

## 2020-07-02 DIAGNOSIS — Z8659 Personal history of other mental and behavioral disorders: Secondary | ICD-10-CM

## 2020-07-02 NOTE — Plan of Care (Signed)
  Problem: Consults Goal: RH BRAIN INJURY PATIENT EDUCATION Description: Description: See Patient Education module for eduction specifics Outcome: Progressing   Problem: RH BOWEL ELIMINATION Goal: RH STG MANAGE BOWEL WITH ASSISTANCE Description: STG Manage Bowel with mod Assistance. Outcome: Progressing   Problem: RH BLADDER ELIMINATION Goal: RH STG MANAGE BLADDER WITH ASSISTANCE Description: STG Manage Bladder With mod Assistance Outcome: Progressing   Problem: RH SAFETY Goal: RH STG ADHERE TO SAFETY PRECAUTIONS W/ASSISTANCE/DEVICE Description: STG Adhere to Safety Precautions With mod Assistance/Device. Outcome: Progressing   Problem: RH COGNITION-NURSING Goal: RH STG USES MEMORY AIDS/STRATEGIES W/ASSIST TO PROBLEM SOLVE Description: STG Uses Memory Aids/Strategies With Assistance to Problem Solve with mod assist Outcome: Progressing   Problem: RH PAIN MANAGEMENT Goal: RH STG PAIN MANAGED AT OR BELOW PT'S PAIN GOAL Description: Less than 4 Outcome: Progressing   Problem: RH KNOWLEDGE DEFICIT BRAIN INJURY Goal: RH STG INCREASE KNOWLEDGE OF SELF CARE AFTER BRAIN INJURY Description: Patient/family will be able to describe health care for patient following rehab with cues/handouts Outcome: Progressing

## 2020-07-02 NOTE — Progress Notes (Signed)
PROGRESS NOTE   Subjective/Complaints: Patient seen laying in bed this morning.  Husband assisting donning clothes.  He states she slept well overnight.  He states her appetite is improving.  They are ready for discharge.  He is very appreciative of her care.  ROS: Nausea (improved), SOB (baseline). Appears to deny CP, diarrhea.   Objective:   No results found. No results for input(s): WBC, HGB, HCT, PLT in the last 72 hours. No results for input(s): NA, K, CL, CO2, GLUCOSE, BUN, CREATININE, CALCIUM in the last 72 hours.  Intake/Output Summary (Last 24 hours) at 07/02/2020 0916 Last data filed at 07/01/2020 1300 Gross per 24 hour  Intake 120 ml  Output --  Net 120 ml        Physical Exam: Vital Signs Blood pressure (!) 136/92, pulse (!) 105, temperature 98.6 F (37 C), resp. rate 17, height 5\' 6"  (1.676 m), weight 80.9 kg, last menstrual period 06/03/2018, SpO2 95 %. Constitutional: No distress . Vital signs reviewed. HENT: Normocephalic.  Atraumatic. Eyes: EOMI. No discharge. Cardiovascular: No JVD.  Tachycardia.  Regular rhythm. Respiratory: Pursed lip breathing.  No stridor.  Bilateral clear to auscultation. GI: Non-distended.  BS +. Skin: Warm and dry.  Intact. Psych: Normal mood.  Normal behavior. Musc: No edema in extremities.  No tenderness in extremities. Neurologic: Alert Motor: Appears to be 4/5 throughout, stable  Assessment/Plan: 1. Functional deficits which require 3+ hours per day of interdisciplinary therapy in a comprehensive inpatient rehab setting.  Physiatrist is providing close team supervision and 24 hour management of active medical problems listed below.  Physiatrist and rehab team continue to assess barriers to discharge/monitor patient progress toward functional and medical goals  Care Tool:  Bathing    Body parts bathed by patient: Right arm,Left arm,Chest,Abdomen,Face,Left upper  leg,Right upper leg,Right lower leg,Left lower leg,Front perineal area,Buttocks   Body parts bathed by helper: Front perineal area,Buttocks     Bathing assist Assist Level: Supervision/Verbal cueing     Upper Body Dressing/Undressing Upper body dressing   What is the patient wearing?: Pull over shirt    Upper body assist Assist Level: Set up assist    Lower Body Dressing/Undressing Lower body dressing      What is the patient wearing?: Incontinence brief,Pants     Lower body assist Assist for lower body dressing: Supervision/Verbal cueing     Toileting Toileting    Toileting assist Assist for toileting: Supervision/Verbal cueing     Transfers Chair/bed transfer  Transfers assist     Chair/bed transfer assist level: Supervision/Verbal cueing     Locomotion Ambulation   Ambulation assist      Assist level: Contact Guard/Touching assist Assistive device: Walker-rolling Max distance: 88ft   Walk 10 feet activity   Assist     Assist level: Contact Guard/Touching assist Assistive device: Walker-rolling   Walk 50 feet activity   Assist    Assist level: Contact Guard/Touching assist Assistive device: Walker-rolling    Walk 150 feet activity   Assist Walk 150 feet activity did not occur: Safety/medical concerns         Walk 10 feet on uneven surface  activity   Assist  Walk 10 feet on uneven surfaces activity did not occur: Safety/medical concerns   Assist level: Contact Guard/Touching assist Assistive device: Aeronautical engineer Will patient use wheelchair at discharge?: No Type of Wheelchair: Manual    Wheelchair assist level: Supervision/Verbal cueing Max wheelchair distance: 15    Wheelchair 50 feet with 2 turns activity    Assist            Wheelchair 150 feet activity     Assist          Medical Problem List and Plan: 1.Decreased functional mobilitysecondary to acute hypoxic  respiratory failure with compromised airway in the setting of cardiac arrest/CPR and aspiration pneumonitis. Extubated 2/20- with likely anoxic brain injury due to 3 minutes minimum of asystole  DC today  Will see patient for hospital follow-up in 1 month post-discharge 2. Antithrombotics: -DVT/anticoagulation: Continue Subcutaneous heparin -antiplatelet therapy: N/A 3. Pain Management:Tylenol as needed-  NO ZANAFLEX or other muscle relaxants- pt has specific recent hx of addiction to Zanaflex. 4. Mood/PTSD with anxiety:   Prozac 40 mg daily.     Will avoid benzos due to history of addiction   Prazosin started on 3/8   No changes per psych   Improved -antipsychotic agents: Abilify 5 mg daily 5. Neuropsych: This patientis?capable of making decisions onherown behalf.  Discussed with neuropsych-appreciate support. 6. Skin/Wound Care:Routine skin checks 7. Fluids/Electrolytes/Nutrition:Routine in and outs 8. AKI secondary to ischemic ATN following cardiac arrest. CRRT 06/08/2020 transition to hemodialysis 06/15/2020 currently on hold   Follow-up hemodialysis as needed  Cr.  0.94 on 3/9  Continue to monitor 9. Anemia of critical illness. Small hematochezia and stool morning of 06/16/2020. Follow-up gastroenterology services as needed. Continue Protonix  Hb 8.6 on 3/7  Continue to monitor 10. Hypertensionwith new Hypotension.  Vitals:   07/02/20 0414 07/02/20 0535  BP: 133/83 (!) 136/92  Pulse: (!) 102 (!) 105  Resp: 20 17  Temp: 98.2 F (36.8 C) 98.6 F (37 C)  SpO2:  95%   Decreased proamatine to 5mg  bid, d/ced on 3/7  Added low dose coreg, increased on 3/9, increased again on 3/11   See #4  Relatively controlled on 3/12 11.  Prediabetes. Hemoglobin A1c 6.1. SSI  Mildly elevated on 3/10 12. Dysphagia.  Dysphagia #3 thin liquid diet.   Advance diet as tolerated  Limited p.o. intake-strong behavioral component 13. Right ankle fracture  status post ORIF 03/28/2020. Weightbearing as toleratednow. Follow-up per Dr. Mardelle Matte 14. Substance abuse. UDS positive for amphetamines. Not prescribed. Outpatient follow-up- also per husband abusing 64 mg Zanaflexdaily prior to [place]  Husband would like to avoid pain meds/muscle relaxers 15. Recent COVID-19 infection. Tested +1/18. Patient did not require treatment or hospitalization. Outside the window of isolation. 16. Class I obesity. BMI 30.74. Dietary follow-up 17. Bowel and bladder incontinence- new since hospitalization-  18. Chronic nausea- Reglan changed to PO  Very extensive GI w/u for similar sx in 2020 which was negative   Added mylicon, reduced TF rate, changed to p.o. formula-discussed with dietary  KUB personally reviewed, showing the replacement of NG tube, otherwise unremarkable  Significant dry heaving as well  Strong behavioral component, confirmed across all disciplines  Abdominal CT from 1/22 relatively unremarkable  Discussed with GI, no changes, plan to follow up as outpatient 19. Hypokalemia:   K+ 3.9 on 3/9  Daily supplement initiated, decreased on 3/10 20. Tachycardia: continue to monitor   Cont Coreg, increase as tolerated  See #10, #4  Remains mildly elevated on 3/12, continue monitoring ambulatory setting with potential further adjustments as necessary 21. Low protein: edited diet order to include high protein foods.  22.  Sleep disturbance  Melatonin started on 3/8  Benefit with husband spending night.  Improving  > 30 minutes spent in total in discharge planning between myself and PA regarding aforementioned, as well discussion regarding DME equipment, follow-up appointments, follow-up therapies, discharge medications, discharge recommendations, answering questions.  Please see discharge summary as well.  LOS: 9 days A FACE TO FACE EVALUATION WAS PERFORMED  Braydon Kullman Lorie Phenix 07/02/2020, 9:16 AM

## 2020-07-02 NOTE — Progress Notes (Signed)
Patient discharged to home per wheelchair accompanied by NT. Husband in room claims they had all the discahrge instructions and only needs to go home. PIV taken out and morning meds given. No further questions noted.

## 2020-07-05 ENCOUNTER — Telehealth: Payer: Self-pay

## 2020-07-05 NOTE — Telephone Encounter (Signed)
Collier Salina, PT from [facility] called requesting VO for 2wk6, 1wk3. Approved and given orders.

## 2020-07-06 ENCOUNTER — Telehealth: Payer: Self-pay | Admitting: *Deleted

## 2020-07-06 DIAGNOSIS — S82851D Displaced trimalleolar fracture of right lower leg, subsequent encounter for closed fracture with routine healing: Secondary | ICD-10-CM | POA: Diagnosis not present

## 2020-07-06 NOTE — Telephone Encounter (Signed)
[name] ST [facility] called for POC approval for beginning today 1wk2, 0wk1, 1wk2.  Approval given.

## 2020-07-20 ENCOUNTER — Telehealth: Payer: Self-pay

## 2020-07-20 NOTE — Telephone Encounter (Signed)
Lonn Georgia, OT from [facility] called requesting verbal orders HHOT 1wk4. Orders approved and given.

## 2020-07-21 DIAGNOSIS — F419 Anxiety disorder, unspecified: Secondary | ICD-10-CM

## 2020-07-21 DIAGNOSIS — R1310 Dysphagia, unspecified: Secondary | ICD-10-CM | POA: Diagnosis not present

## 2020-07-21 DIAGNOSIS — S82891D Other fracture of right lower leg, subsequent encounter for closed fracture with routine healing: Secondary | ICD-10-CM | POA: Diagnosis not present

## 2020-07-21 DIAGNOSIS — D631 Anemia in chronic kidney disease: Secondary | ICD-10-CM | POA: Diagnosis not present

## 2020-07-21 DIAGNOSIS — R32 Unspecified urinary incontinence: Secondary | ICD-10-CM

## 2020-07-21 DIAGNOSIS — I959 Hypotension, unspecified: Secondary | ICD-10-CM | POA: Diagnosis not present

## 2020-07-21 DIAGNOSIS — K2270 Barrett's esophagus without dysplasia: Secondary | ICD-10-CM | POA: Diagnosis not present

## 2020-07-21 DIAGNOSIS — J9601 Acute respiratory failure with hypoxia: Secondary | ICD-10-CM | POA: Diagnosis not present

## 2020-07-21 DIAGNOSIS — R7303 Prediabetes: Secondary | ICD-10-CM

## 2020-07-21 DIAGNOSIS — G9341 Metabolic encephalopathy: Secondary | ICD-10-CM | POA: Diagnosis not present

## 2020-07-21 DIAGNOSIS — N170 Acute kidney failure with tubular necrosis: Secondary | ICD-10-CM | POA: Diagnosis not present

## 2020-07-21 DIAGNOSIS — G8929 Other chronic pain: Secondary | ICD-10-CM | POA: Diagnosis not present

## 2020-07-21 DIAGNOSIS — F32A Depression, unspecified: Secondary | ICD-10-CM

## 2020-07-21 DIAGNOSIS — F1520 Other stimulant dependence, uncomplicated: Secondary | ICD-10-CM

## 2020-07-21 DIAGNOSIS — M5459 Other low back pain: Secondary | ICD-10-CM | POA: Diagnosis not present

## 2020-07-21 DIAGNOSIS — E876 Hypokalemia: Secondary | ICD-10-CM

## 2020-07-21 DIAGNOSIS — I10 Essential (primary) hypertension: Secondary | ICD-10-CM | POA: Diagnosis not present

## 2020-07-21 DIAGNOSIS — R000 Tachycardia, unspecified: Secondary | ICD-10-CM

## 2020-07-21 DIAGNOSIS — J690 Pneumonitis due to inhalation of food and vomit: Secondary | ICD-10-CM | POA: Diagnosis not present

## 2020-07-21 DIAGNOSIS — F4310 Post-traumatic stress disorder, unspecified: Secondary | ICD-10-CM

## 2020-08-01 ENCOUNTER — Telehealth: Payer: Self-pay | Admitting: *Deleted

## 2020-08-01 MED ORDER — CARVEDILOL 6.25 MG PO TABS: 6.2500 mg | ORAL_TABLET | Freq: Two times a day (BID) | ORAL | 0 refills | Status: DC

## 2020-08-01 MED ORDER — OMEPRAZOLE 20 MG PO CPDR: 20.0000 mg | DELAYED_RELEASE_CAPSULE | Freq: Every day | ORAL | 0 refills | Status: DC

## 2020-08-01 MED ORDER — PRAZOSIN HCL 2 MG PO CAPS: 2.0000 mg | ORAL_CAPSULE | Freq: Every day | ORAL | 0 refills | Status: DC

## 2020-08-01 MED ORDER — ARIPIPRAZOLE 5 MG PO TABS: 5.0000 mg | ORAL_TABLET | Freq: Every day | ORAL | 0 refills | Status: DC

## 2020-08-01 MED ORDER — RENA-VITE PO TABS: 1.0000 | ORAL_TABLET | Freq: Every day | ORAL | 0 refills | Status: AC

## 2020-08-01 NOTE — Telephone Encounter (Signed)
Eliza ST called for ext ST 1wk4 starting next week. Approval given.

## 2020-08-01 NOTE — Telephone Encounter (Signed)
Received a call for refills on medications we discharged [name] from [place] [facility] on.  I do not see that she has seen any of the providers who consulted on her yet, and Dr Posey Pronto is out of the office all week, so I am sending to Dr Letta Pate who saw her on inpt rehab.  The request is for the omeprazole, prazosin, carvedilol, and renovite and aripirazole. She is scheduled to see Dr Posey Pronto 08/16/20 for hosp follow up. No appts in system for Dr Burman Foster or Dr Gwenlyn Found at this time, or the primary Dr Debria Garret.  Is it ok to give a one month courtesy refill on these medications?

## 2020-08-01 NOTE — Telephone Encounter (Signed)
[name] has called about her medications. She is out.  Dr Letta Pate is not in the office today. I will give courtesy refill for 1 month and make Tracy Dorough aware that these medications will be handled by Nira Conn' PCP/Cardiologist/Nephrologist from here on out.

## 2020-08-16 ENCOUNTER — Encounter: Payer: Self-pay | Admitting: Physical Medicine & Rehabilitation

## 2020-08-16 ENCOUNTER — Encounter: Payer: Medicare Other | Attending: Physical Medicine & Rehabilitation | Admitting: Physical Medicine & Rehabilitation

## 2020-08-16 ENCOUNTER — Other Ambulatory Visit: Payer: Self-pay

## 2020-08-16 VITALS — BP 110/77 | HR 88 | Temp 98.9°F | Ht 66.0 in | Wt 189.8 lb

## 2020-08-16 DIAGNOSIS — I469 Cardiac arrest, cause unspecified: Secondary | ICD-10-CM

## 2020-08-16 DIAGNOSIS — F1910 Other psychoactive substance abuse, uncomplicated: Secondary | ICD-10-CM

## 2020-08-16 DIAGNOSIS — G931 Anoxic brain damage, not elsewhere classified: Secondary | ICD-10-CM

## 2020-08-16 DIAGNOSIS — K529 Noninfective gastroenteritis and colitis, unspecified: Secondary | ICD-10-CM | POA: Diagnosis present

## 2020-08-16 DIAGNOSIS — G479 Sleep disorder, unspecified: Secondary | ICD-10-CM

## 2020-08-16 NOTE — Progress Notes (Addendum)
Subjective:    Patient ID: [name], female    DOB: 05-06-1970, 50 y.o.   MRN: [id number]  HPI Right-handed female with history of depression, opiate use, PTSD with reported prior suicide attempts maintained on Prozac Lamictal as well as Risperdal, hypertension, Covid 1/18, endometriosis, ORIF right ankle 03/28/2020 and is weightbearing as tolerated sedentary since recent ankle fracture presents for follow up for anoxic brain injury.  Husband provides history.  He notes good progress since discharge. She has an appointment with GI. She needs to schedule with Cards and Nephro. Pain is relatively controlled with OTC meds. She is on regular diet now. Nausea is relatively controlled. Sleep is "pretty good".  Denies falls.   Therapies: 1/week  Pain Inventory Average Pain 5 Pain Right Now 4 My pain is constant, dull and aching  LOCATION OF PAIN  Back and ankle  BOWEL Number of stools per week: 12-14 Oral laxative use No  Type of laxative Enema or suppository use  History of colostomy  Incontinent  BLADDER Normal In and out cath, frequency  Able to self cath  Bladder incontinence  Frequent urination  Leakage with coughing  Difficulty starting stream  Incomplete bladder emptying    Mobility walk without assistance walk with assistance use a cane use a walker how many minutes can you walk? 5-8 ability to climb steps?  no do you drive?  no use a wheelchair needs help with transfers  Function not employed: date last employed 2008 disabled: date disabled 2017 I need assistance with the following:  meal prep, household duties and shopping  Neuro/Psych weakness numbness tingling trouble walking confusion depression anxiety  Prior Studies Any changes since last visit?  no  Physicians involved in your care Any changes since last visit?  no   Family History  Problem Relation Age of Onset  . Breast cancer Mother   . Diabetes Mother   . Breast cancer  Maternal Grandmother   . Breast cancer Paternal Grandmother   . Colon polyps Paternal Grandmother   . Colon cancer Neg Hx   . Esophageal cancer Neg Hx   . Rectal cancer Neg Hx   . Stomach cancer Neg Hx    Social History   Socioeconomic History  . Marital status: Married    Spouse name: Not on file  . Number of children: Not on file  . Years of education: Not on file  . Highest education level: Not on file  Occupational History  . Not on file  Tobacco Use  . Smoking status: Never Smoker  . Smokeless tobacco: Never Used  Vaping Use  . Vaping Use: Never used  Substance and Sexual Activity  . Alcohol use: Not Currently    Comment: 10/15/2014 "I've drank before; nothing regular; don't drink now cause of RX I'm on"  . Drug use: No  . Sexual activity: Not Currently  Other Topics Concern  . Not on file  Social History Narrative   [place] with husband. Never smoked; quit alcohol 10 years; used to work for [employer]. NO IV drug abuse.    Social Determinants of Health   Financial Resource Strain: Not on file  Food Insecurity: Not on file  Transportation Needs: Not on file  Physical Activity: Not on file  Stress: Not on file  Social Connections: Not on file   Past Surgical History:  Procedure Laterality Date  . ABLATION ON ENDOMETRIOSIS  2008  . BIOPSY  12/27/2018   Procedure:  BIOPSY;  Surgeon: Thornton Park, MD;  Location: [facility];  Service: Gastroenterology;;  . ESOPHAGOGASTRODUODENOSCOPY (EGD) WITH PROPOFOL N/A 12/27/2018   Procedure: ESOPHAGOGASTRODUODENOSCOPY (EGD) WITH PROPOFOL;  Surgeon: Thornton Park, MD;  Location: [facility];  Service: Gastroenterology;  Laterality: N/A;  . HEMORRHOID SURGERY  ~ 2002  . IR FLUORO GUIDE CV LINE RIGHT  06/14/2020  . IR REMOVAL TUN CV CATH W/O FL  06/23/2020  . IR US GUIDE VASC ACCESS RIGHT  06/14/2020  . ORIF ANKLE FRACTURE Right 03/28/2020   Procedure: OPEN REDUCTION INTERNAL FIXATION  (ORIF) RIGHT BIMALLEOLAR ANKLE FRACTURE;  Surgeon: Marchia Bond, MD;  Location: [facility];  Service: Orthopedics;  Laterality: Right;   Past Medical History:  Diagnosis Date  . Anxiety   . Arthritis    "joints ache all over" (10/15/2014)  . Barrett's esophagus   . Bulging lumbar disc   . Chronic lower back pain   . DDD (degenerative disc disease), cervical   . Depression   . Drug-seeking behavior   . Headache    "weekly" (10/15/2014)  . Hyperlipemia   . Hypertension   . PTSD (post-traumatic stress disorder)   . Skin cancer    "had them cut off my arms; don't know what kind"   BP 110/77   Pulse 88   Temp 98.9 F (37.2 C)   Ht 5\' 6"  (1.676 m)   Wt 189 lb 12.8 oz (86.1 kg)   LMP 06/03/2018 (Approximate) Comment: neg hcg 05/10/20  SpO2 94%   BMI 30.63 kg/m   Opioid Risk Score:   Fall Risk Score:  `1  Depression screen PHQ 2/9  Depression screen PHQ 2/9 08/16/2020  Decreased Interest 1  Down, Depressed, Hopeless 0  PHQ - 2 Score 1  Altered sleeping 0  Tired, decreased energy 1  Change in appetite 0  Feeling bad or failure about yourself  1  Trouble concentrating 0  Moving slowly or fidgety/restless 0  Suicidal thoughts 0  PHQ-9 Score 3  Difficult doing work/chores Not difficult at all    Review of Systems  HENT: Negative.   Eyes: Negative.   Respiratory: Negative.   Cardiovascular: Positive for leg swelling.  Gastrointestinal: Positive for nausea.  Endocrine: Negative.   Genitourinary: Negative.   Musculoskeletal: Positive for back pain and gait problem.  Skin: Negative.   Allergic/Immunologic: Negative.   Neurological: Positive for weakness and numbness.       Tingling  Hematological: Negative.   Psychiatric/Behavioral: Positive for dysphoric mood. The patient is nervous/anxious.   All other systems reviewed and are negative.     Objective:   Physical Exam  Constitutional: No distress . Vital signs reviewed. HENT: Normocephalic.   Atraumatic. Eyes: EOMI. No discharge. Cardiovascular: No JVD.   Respiratory: Normal effort.  No stridor.   GI: Non-distended.   Skin: Warm and dry.  Intact. Psych: Normal mood.  Normal behavior. Musc: No edema in extremities.  No tenderness in extremities. Neurologic: Alert Motor: Appears to be 4/5 throughout, unchanged    Assessment & Plan:  Right-handed female with history of depression, opiate use, PTSD with reported prior suicide attempts maintained on Prozac Lamictal as well as Risperdal, hypertension, Covid 1/18, endometriosis, ORIF right ankle 03/28/2020 and is weightbearing as tolerated sedentary since recent ankle fracture presents for follow up for anoxic brain injury.  1. Decreased functional mobility secondary to acute hypoxic respiratory failure with compromised airway in the setting of cardiac arrest/CPR and aspiration pneumonitis. Extubated 2/20- with likely anoxic brain  injury due to  3 minutes minimum  of asystole                  Continue therapies  Follow up with Cards Neprho, GI - needs appointment  2. Pain Management:   Controlled with OTC tylenol   3. AKI secondary to ischemic ATN following cardiac arrest. CRRT 06/08/2020 transition to hemodialysis 06/15/2020 currently on hold              Follow-up with Nephro  4. Anemia of critical illness.   Small hematochezia and stool morning of 06/16/2020.   Follow-up GI, needs appointment  5. Dysphagia.    Resolved  6. Right ankle fracture status post ORIF 03/28/2020. Weightbearing as tolerated now.   Follow up per Dr. Mardelle Matte  7. Hx of substance abuse.   Cont to encourage  8. Bowel and bladder incontinence: Resolved  9. Chronic nausea  Cont meds  Follow up with GI  Meds reviewed Referrals reviewed - needs GI, Nephro, Cards All questions answered

## 2020-08-22 DIAGNOSIS — I10 Essential (primary) hypertension: Secondary | ICD-10-CM | POA: Diagnosis not present

## 2020-08-22 DIAGNOSIS — Z0000 Encounter for general adult medical examination without abnormal findings: Secondary | ICD-10-CM | POA: Diagnosis not present

## 2020-08-22 DIAGNOSIS — K3184 Gastroparesis: Secondary | ICD-10-CM | POA: Diagnosis not present

## 2020-08-22 DIAGNOSIS — I2510 Atherosclerotic heart disease of native coronary artery without angina pectoris: Secondary | ICD-10-CM | POA: Diagnosis not present

## 2020-08-22 DIAGNOSIS — N189 Chronic kidney disease, unspecified: Secondary | ICD-10-CM | POA: Diagnosis not present

## 2020-08-22 DIAGNOSIS — F319 Bipolar disorder, unspecified: Secondary | ICD-10-CM | POA: Diagnosis not present

## 2020-08-22 DIAGNOSIS — R7301 Impaired fasting glucose: Secondary | ICD-10-CM | POA: Diagnosis not present

## 2020-08-22 DIAGNOSIS — E538 Deficiency of other specified B group vitamins: Secondary | ICD-10-CM | POA: Diagnosis not present

## 2020-08-22 DIAGNOSIS — D649 Anemia, unspecified: Secondary | ICD-10-CM | POA: Diagnosis not present

## 2020-08-30 ENCOUNTER — Telehealth: Payer: Self-pay | Admitting: *Deleted

## 2020-08-30 NOTE — Telephone Encounter (Signed)
[name] called for [name] POC 1wk4 beginning 09/05/20.  Approval given.

## 2020-09-01 DIAGNOSIS — M25571 Pain in right ankle and joints of right foot: Secondary | ICD-10-CM | POA: Diagnosis not present

## 2020-09-05 ENCOUNTER — Other Ambulatory Visit: Payer: Self-pay | Admitting: Physical Medicine & Rehabilitation

## 2020-09-12 DIAGNOSIS — Z79899 Other long term (current) drug therapy: Secondary | ICD-10-CM | POA: Diagnosis not present

## 2020-09-12 DIAGNOSIS — M5459 Other low back pain: Secondary | ICD-10-CM | POA: Diagnosis not present

## 2020-09-12 DIAGNOSIS — Z79891 Long term (current) use of opiate analgesic: Secondary | ICD-10-CM | POA: Diagnosis not present

## 2020-09-12 DIAGNOSIS — M4726 Other spondylosis with radiculopathy, lumbar region: Secondary | ICD-10-CM | POA: Diagnosis not present

## 2020-09-12 DIAGNOSIS — M25579 Pain in unspecified ankle and joints of unspecified foot: Secondary | ICD-10-CM | POA: Diagnosis not present

## 2020-09-12 DIAGNOSIS — G894 Chronic pain syndrome: Secondary | ICD-10-CM | POA: Diagnosis not present

## 2020-09-15 ENCOUNTER — Other Ambulatory Visit: Payer: Self-pay | Admitting: Physician Assistant

## 2020-09-15 DIAGNOSIS — M5126 Other intervertebral disc displacement, lumbar region: Secondary | ICD-10-CM

## 2020-09-15 DIAGNOSIS — M5136 Other intervertebral disc degeneration, lumbar region: Secondary | ICD-10-CM

## 2020-09-15 DIAGNOSIS — M51369 Other intervertebral disc degeneration, lumbar region without mention of lumbar back pain or lower extremity pain: Secondary | ICD-10-CM

## 2020-09-25 ENCOUNTER — Other Ambulatory Visit: Payer: Self-pay

## 2020-09-25 ENCOUNTER — Ambulatory Visit
Admission: RE | Admit: 2020-09-25 | Discharge: 2020-09-25 | Disposition: A | Discharge status: Home / Self Care | Payer: Medicare Other | Source: Ambulatory Visit | Attending: Physician Assistant | Admitting: Physician Assistant

## 2020-09-25 DIAGNOSIS — M545 Low back pain, unspecified: Secondary | ICD-10-CM | POA: Diagnosis not present

## 2020-09-25 DIAGNOSIS — M48061 Spinal stenosis, lumbar region without neurogenic claudication: Secondary | ICD-10-CM | POA: Diagnosis not present

## 2020-09-25 DIAGNOSIS — M5136 Other intervertebral disc degeneration, lumbar region: Secondary | ICD-10-CM

## 2020-09-25 DIAGNOSIS — M5126 Other intervertebral disc displacement, lumbar region: Secondary | ICD-10-CM

## 2020-09-26 ENCOUNTER — Other Ambulatory Visit: Payer: Self-pay | Admitting: Physical Medicine & Rehabilitation

## 2020-09-28 ENCOUNTER — Other Ambulatory Visit: Payer: Self-pay | Admitting: Physical Medicine & Rehabilitation

## 2020-09-29 DIAGNOSIS — I469 Cardiac arrest, cause unspecified: Secondary | ICD-10-CM | POA: Diagnosis not present

## 2020-09-29 DIAGNOSIS — N390 Urinary tract infection, site not specified: Secondary | ICD-10-CM | POA: Diagnosis not present

## 2020-09-29 DIAGNOSIS — I129 Hypertensive chronic kidney disease with stage 1 through stage 4 chronic kidney disease, or unspecified chronic kidney disease: Secondary | ICD-10-CM | POA: Diagnosis not present

## 2020-09-29 DIAGNOSIS — N179 Acute kidney failure, unspecified: Secondary | ICD-10-CM | POA: Diagnosis not present

## 2020-10-10 DIAGNOSIS — S32000A Wedge compression fracture of unspecified lumbar vertebra, initial encounter for closed fracture: Secondary | ICD-10-CM | POA: Diagnosis not present

## 2020-10-10 DIAGNOSIS — M4726 Other spondylosis with radiculopathy, lumbar region: Secondary | ICD-10-CM | POA: Diagnosis not present

## 2020-10-10 DIAGNOSIS — G894 Chronic pain syndrome: Secondary | ICD-10-CM | POA: Diagnosis not present

## 2020-10-10 DIAGNOSIS — M25579 Pain in unspecified ankle and joints of unspecified foot: Secondary | ICD-10-CM | POA: Diagnosis not present

## 2020-10-26 ENCOUNTER — Ambulatory Visit: Payer: Medicare Other | Admitting: Cardiovascular Disease

## 2020-11-08 DIAGNOSIS — M25579 Pain in unspecified ankle and joints of unspecified foot: Secondary | ICD-10-CM | POA: Diagnosis not present

## 2020-11-08 DIAGNOSIS — M4726 Other spondylosis with radiculopathy, lumbar region: Secondary | ICD-10-CM | POA: Diagnosis not present

## 2020-11-08 DIAGNOSIS — S32000A Wedge compression fracture of unspecified lumbar vertebra, initial encounter for closed fracture: Secondary | ICD-10-CM | POA: Diagnosis not present

## 2020-11-08 DIAGNOSIS — G894 Chronic pain syndrome: Secondary | ICD-10-CM | POA: Diagnosis not present

## 2020-11-11 ENCOUNTER — Other Ambulatory Visit: Payer: Self-pay

## 2020-11-11 ENCOUNTER — Ambulatory Visit (INDEPENDENT_AMBULATORY_CARE_PROVIDER_SITE_OTHER): Payer: Medicare Other | Admitting: Cardiovascular Disease

## 2020-11-11 ENCOUNTER — Encounter: Payer: Self-pay | Admitting: Cardiovascular Disease

## 2020-11-11 VITALS — BP 140/72 | HR 88 | Ht 64.0 in | Wt 209.4 lb

## 2020-11-11 DIAGNOSIS — I10 Essential (primary) hypertension: Secondary | ICD-10-CM

## 2020-11-11 DIAGNOSIS — E785 Hyperlipidemia, unspecified: Secondary | ICD-10-CM | POA: Insufficient documentation

## 2020-11-11 DIAGNOSIS — E782 Mixed hyperlipidemia: Secondary | ICD-10-CM

## 2020-11-11 DIAGNOSIS — I469 Cardiac arrest, cause unspecified: Secondary | ICD-10-CM

## 2020-11-11 NOTE — Assessment & Plan Note (Signed)
History of hyperlipidemia on statin therapy followed by her PCP. 

## 2020-11-11 NOTE — Progress Notes (Signed)
11/11/2020 Abby Potash   08-23-1970  [id number]  Primary Physician Karam, Norris Cross, MD Primary Cardiologist: Lorretta Harp MD Lupe Carney, [place]  HPI:  [name] is a 50 y.o. severely overweight married Caucasian female mother of 2 children who currently does not work and is being seen for post hospital follow-up at the request of her primary care provider, Dr. Claris Gower.  Risk factors include treated hypertension and hyperlipidemia.  She does not smoke.  There is no family history of heart disease.  She is never had a heart attack or stroke.  She denies chest pain or shortness of breath.  She did have COVID back in January.  She had a witnessed cardiac arrest 07/06/2020 with ROSC in 3 minutes.  I was asked to see her in the emergency room and at that time I did not think her EKG met STEMI criteria.  Bedside echo was performed by Dr. Audie Box  revealing normal LV systolic function without regional wall motion abnormalities.  She was hospitalized from 06/08/2020 to 06/23/2020.  She did have sepsis and her UDS was positive for opiates although she denies their use.  Her 2D echo during her hospitalization was entirely normal.  Since discharge she has had no symptoms of chest pain or shortness of breath.   Current Meds  Medication Sig   acetaminophen (TYLENOL) 325 MG tablet Take 2 tablets (650 mg total) by mouth every 6 (six) hours as needed for mild pain, fever or headache.   ARIPiprazole (ABILIFY) 5 MG tablet Take 1 tablet (5 mg total) by mouth daily.   atorvastatin (LIPITOR) 10 MG tablet Take 10 mg by mouth daily.   carvedilol (COREG) 6.25 MG tablet Take 1 tablet (6.25 mg total) by mouth 2 (two) times daily with a meal.   EUTHYROX 25 MCG tablet Take 25 mcg by mouth every morning.   ferrous sulfate 325 (65 FE) MG tablet Take 1 tablet (325 mg total) by mouth daily with breakfast.   FLUoxetine (PROZAC) 40 MG capsule Take 1 capsule (40 mg total) by mouth daily.   melatonin  300 MCG TABS Take 5 tablets (1,500 mcg total) by mouth at bedtime.   metoCLOPramide (REGLAN) 10 MG tablet Take 1 tablet (10 mg total) by mouth 3 (three) times daily before meals.   multivitamin (RENA-VIT) TABS tablet Take 1 tablet by mouth at bedtime.   omeprazole (PRILOSEC) 20 MG capsule Take 1 capsule (20 mg total) by mouth daily.   prazosin (MINIPRESS) 2 MG capsule Take 1 capsule (2 mg total) by mouth at bedtime.   traZODone (DESYREL) 50 MG tablet Take by mouth.     Allergies  Allergen Reactions   Lactose Intolerance (Gi) Nausea Only    Social History   Socioeconomic History   Marital status: Married    Spouse name: Not on file   Number of children: Not on file   Years of education: Not on file   Highest education level: Not on file  Occupational History   Not on file  Tobacco Use   Smoking status: Never   Smokeless tobacco: Never  Vaping Use   Vaping Use: Never used  Substance and Sexual Activity   Alcohol use: Not Currently    Comment: 10/15/2014 "I've drank before; nothing regular; don't drink now cause of RX I'm on"   Drug use: No   Sexual activity: Not Currently  Other Topics Concern   Not on file  Social History Narrative  [place]. Never smoked; quit alcohol 10 years; used to work for [employer]. NO IV drug abuse.    Social Determinants of Health   Financial Resource Strain: Not on file  Food Insecurity: Not on file  Transportation Needs: Not on file  Physical Activity: Not on file  Stress: Not on file  Social Connections: Not on file  Intimate Partner Violence: Not on file     Review of Systems: General: negative for chills, fever, night sweats or weight changes.  Cardiovascular: negative for chest pain, dyspnea on exertion, edema, orthopnea, palpitations, paroxysmal nocturnal dyspnea or shortness of breath Dermatological: negative for rash Respiratory: negative for cough or wheezing Urologic: negative  for hematuria Abdominal: negative for nausea, vomiting, diarrhea, bright red blood per rectum, melena, or hematemesis Neurologic: negative for visual changes, syncope, or dizziness All other systems reviewed and are otherwise negative except as noted above.    Blood pressure 140/72, pulse 88, height 5' 4"  (1.626 m), weight 209 lb 6.4 oz (95 kg), last menstrual period 06/03/2018, SpO2 93 %.  General appearance: alert and no distress Neck: no adenopathy, no carotid bruit, no JVD, supple, symmetrical, trachea midline, and thyroid not enlarged, symmetric, no tenderness/mass/nodules Lungs: clear to auscultation bilaterally Heart: regular rate and rhythm, S1, S2 normal, no murmur, click, rub or gallop Extremities: extremities normal, atraumatic, no cyanosis or edema Pulses: 2+ and symmetric Skin: Skin color, texture, turgor normal. No rashes or lesions Neurologic: Grossly normal  EKG sinus rhythm at 88 without ST or T wave changes.  I personally reviewed this EKG.  ASSESSMENT AND PLAN:   Cardiac arrest [facility]) Ms. Chervenak had witnessed cardiac arrest and ROSC in approximately 3 minutes.  I was initially asked to see her on 06/08/2020.  EKG did not meet criteria for STEMI and a bedside echo performed by Dr. Audie Box showed normal LV function without wall motion abnormalities.  I elected not to take her to the cardiac catheterization laboratory.  Hypertension History of essential hypertension blood pressure measured today at 140/72.  She is on carvedilol.  Hyperlipidemia History of hyperlipidemia on statin therapy followed by her PCP     Lorretta Harp MD [facility], [facility] 11/11/2020 2:37 PM

## 2020-11-11 NOTE — Assessment & Plan Note (Signed)
History of essential hypertension blood pressure measured today at 140/72.  She is on carvedilol.

## 2020-11-11 NOTE — Patient Instructions (Signed)
Medication Instructions:  No Changes In Medications at this time.  *If you need a refill on your cardiac medications before your next appointment, please call your pharmacy*  Follow-Up: At [facility], you and your health needs are our priority.  As part of our continuing mission to provide you with exceptional heart care, we have created designated Provider Care Teams.  These Care Teams include your primary Cardiologist (physician) and Advanced Practice Providers (APPs -  Physician Assistants and Nurse Practitioners) who all work together to provide you with the care you need, when you need it.  We recommend signing up for the patient portal called "MyChart".  Sign up information is provided on this After Visit Summary.  MyChart is used to connect with patients for Virtual Visits (Telemedicine).  Patients are able to view lab/test results, encounter notes, upcoming appointments, etc.  Non-urgent messages can be sent to your provider as well.   To learn more about what you can do with MyChart, go to [email or web address].    Your next appointment:   AS NEEDED   The format for your next appointment:   In Person  Provider:   Quay Burow, MD

## 2020-11-11 NOTE — Assessment & Plan Note (Signed)
Ms. Faulds had witnessed cardiac arrest and ROSC in approximately 3 minutes.  I was initially asked to see her on 06/08/2020.  EKG did not meet criteria for STEMI and a bedside echo performed by Dr. Audie Box showed normal LV function without wall motion abnormalities.  I elected not to take her to the cardiac catheterization laboratory.

## 2020-11-30 ENCOUNTER — Encounter: Payer: Self-pay | Admitting: *Deleted

## 2020-11-30 NOTE — Telephone Encounter (Addendum)
-----   Message from Lorretta Harp, MD sent at 11/22/2020  5:53 PM EDT ----- LDL 114 on atorvastatin 10 mg a day.  Please obtain a coronary calcium score to further evaluate intensity of lipid-lowering therapy.  Left message for pt to call

## 2020-12-02 ENCOUNTER — Encounter: Payer: Self-pay | Admitting: *Deleted

## 2020-12-07 DIAGNOSIS — M4726 Other spondylosis with radiculopathy, lumbar region: Secondary | ICD-10-CM | POA: Diagnosis not present

## 2020-12-07 DIAGNOSIS — M25579 Pain in unspecified ankle and joints of unspecified foot: Secondary | ICD-10-CM | POA: Diagnosis not present

## 2020-12-07 DIAGNOSIS — G894 Chronic pain syndrome: Secondary | ICD-10-CM | POA: Diagnosis not present

## 2020-12-07 DIAGNOSIS — S32000A Wedge compression fracture of unspecified lumbar vertebra, initial encounter for closed fracture: Secondary | ICD-10-CM | POA: Diagnosis not present

## 2020-12-14 NOTE — Telephone Encounter (Signed)
This encounter was created in error - please disregard.

## 2020-12-28 ENCOUNTER — Telehealth: Payer: Self-pay | Admitting: Cardiovascular Disease

## 2020-12-28 NOTE — Telephone Encounter (Signed)
Patient's husband calling in response to a letter they received to discuss the patient's lab work and further testing.

## 2021-01-04 DIAGNOSIS — M4726 Other spondylosis with radiculopathy, lumbar region: Secondary | ICD-10-CM | POA: Diagnosis not present

## 2021-01-04 DIAGNOSIS — G894 Chronic pain syndrome: Secondary | ICD-10-CM | POA: Diagnosis not present

## 2021-01-04 DIAGNOSIS — S32000A Wedge compression fracture of unspecified lumbar vertebra, initial encounter for closed fracture: Secondary | ICD-10-CM | POA: Diagnosis not present

## 2021-01-04 DIAGNOSIS — M25579 Pain in unspecified ankle and joints of unspecified foot: Secondary | ICD-10-CM | POA: Diagnosis not present

## 2021-01-24 DIAGNOSIS — M545 Low back pain, unspecified: Secondary | ICD-10-CM | POA: Diagnosis not present

## 2021-01-24 DIAGNOSIS — R200 Anesthesia of skin: Secondary | ICD-10-CM | POA: Diagnosis not present

## 2021-02-01 DIAGNOSIS — Z79899 Other long term (current) drug therapy: Secondary | ICD-10-CM | POA: Diagnosis not present

## 2021-02-01 DIAGNOSIS — M25579 Pain in unspecified ankle and joints of unspecified foot: Secondary | ICD-10-CM | POA: Diagnosis not present

## 2021-02-01 DIAGNOSIS — S32000A Wedge compression fracture of unspecified lumbar vertebra, initial encounter for closed fracture: Secondary | ICD-10-CM | POA: Diagnosis not present

## 2021-02-01 DIAGNOSIS — G894 Chronic pain syndrome: Secondary | ICD-10-CM | POA: Diagnosis not present

## 2021-02-01 DIAGNOSIS — M4726 Other spondylosis with radiculopathy, lumbar region: Secondary | ICD-10-CM | POA: Diagnosis not present

## 2021-02-01 DIAGNOSIS — Z79891 Long term (current) use of opiate analgesic: Secondary | ICD-10-CM | POA: Diagnosis not present

## 2021-03-02 DIAGNOSIS — S32000A Wedge compression fracture of unspecified lumbar vertebra, initial encounter for closed fracture: Secondary | ICD-10-CM | POA: Diagnosis not present

## 2021-03-02 DIAGNOSIS — M4726 Other spondylosis with radiculopathy, lumbar region: Secondary | ICD-10-CM | POA: Diagnosis not present

## 2021-03-02 DIAGNOSIS — M25579 Pain in unspecified ankle and joints of unspecified foot: Secondary | ICD-10-CM | POA: Diagnosis not present

## 2021-03-02 DIAGNOSIS — G894 Chronic pain syndrome: Secondary | ICD-10-CM | POA: Diagnosis not present

## 2021-03-28 DIAGNOSIS — M47817 Spondylosis without myelopathy or radiculopathy, lumbosacral region: Secondary | ICD-10-CM | POA: Diagnosis not present

## 2021-03-28 DIAGNOSIS — G894 Chronic pain syndrome: Secondary | ICD-10-CM | POA: Diagnosis not present

## 2021-03-28 DIAGNOSIS — M4726 Other spondylosis with radiculopathy, lumbar region: Secondary | ICD-10-CM | POA: Diagnosis not present

## 2021-03-28 DIAGNOSIS — S22080A Wedge compression fracture of T11-T12 vertebra, initial encounter for closed fracture: Secondary | ICD-10-CM | POA: Diagnosis not present

## 2021-04-20 NOTE — Telephone Encounter (Signed)
done

## 2021-05-16 ENCOUNTER — Encounter: Payer: Self-pay | Admitting: Internal Medicine

## 2021-05-23 ENCOUNTER — Other Ambulatory Visit: Payer: Self-pay | Admitting: Physical Medicine & Rehabilitation

## 2021-05-23 ENCOUNTER — Ambulatory Visit: Payer: Medicare Other | Admitting: Cardiovascular Disease

## 2021-08-01 ENCOUNTER — Inpatient Hospital Stay (HOSPITAL_COMMUNITY)
Admission: EM | Admit: 2021-08-01 | Discharge: 2021-08-03 | DRG: 391 | Disposition: A | Discharge status: Home / Self Care | Payer: Medicare Other | Attending: Internal Medicine | Admitting: Internal Medicine

## 2021-08-01 ENCOUNTER — Encounter (HOSPITAL_COMMUNITY): Payer: Self-pay | Admitting: *Deleted

## 2021-08-01 ENCOUNTER — Emergency Department (HOSPITAL_COMMUNITY): Payer: Medicare Other

## 2021-08-01 ENCOUNTER — Other Ambulatory Visit: Payer: Self-pay

## 2021-08-01 DIAGNOSIS — I10 Essential (primary) hypertension: Secondary | ICD-10-CM | POA: Diagnosis present

## 2021-08-01 DIAGNOSIS — E8809 Other disorders of plasma-protein metabolism, not elsewhere classified: Secondary | ICD-10-CM

## 2021-08-01 DIAGNOSIS — K529 Noninfective gastroenteritis and colitis, unspecified: Principal | ICD-10-CM | POA: Diagnosis present

## 2021-08-01 DIAGNOSIS — I700 Atherosclerosis of aorta: Secondary | ICD-10-CM | POA: Diagnosis present

## 2021-08-01 DIAGNOSIS — R7989 Other specified abnormal findings of blood chemistry: Secondary | ICD-10-CM

## 2021-08-01 DIAGNOSIS — E876 Hypokalemia: Secondary | ICD-10-CM | POA: Diagnosis present

## 2021-08-01 DIAGNOSIS — G894 Chronic pain syndrome: Secondary | ICD-10-CM | POA: Diagnosis present

## 2021-08-01 DIAGNOSIS — R000 Tachycardia, unspecified: Secondary | ICD-10-CM | POA: Diagnosis present

## 2021-08-01 DIAGNOSIS — E669 Obesity, unspecified: Secondary | ICD-10-CM | POA: Diagnosis present

## 2021-08-01 DIAGNOSIS — E119 Type 2 diabetes mellitus without complications: Secondary | ICD-10-CM

## 2021-08-01 DIAGNOSIS — Z803 Family history of malignant neoplasm of breast: Secondary | ICD-10-CM

## 2021-08-01 DIAGNOSIS — F32A Depression, unspecified: Secondary | ICD-10-CM | POA: Diagnosis present

## 2021-08-01 DIAGNOSIS — K219 Gastro-esophageal reflux disease without esophagitis: Secondary | ICD-10-CM | POA: Diagnosis present

## 2021-08-01 DIAGNOSIS — F4310 Post-traumatic stress disorder, unspecified: Secondary | ICD-10-CM | POA: Diagnosis present

## 2021-08-01 DIAGNOSIS — N809 Endometriosis, unspecified: Secondary | ICD-10-CM | POA: Diagnosis present

## 2021-08-01 DIAGNOSIS — Z85828 Personal history of other malignant neoplasm of skin: Secondary | ICD-10-CM

## 2021-08-01 DIAGNOSIS — Z6835 Body mass index (BMI) 35.0-35.9, adult: Secondary | ICD-10-CM

## 2021-08-01 DIAGNOSIS — I4711 Inappropriate sinus tachycardia, so stated: Secondary | ICD-10-CM | POA: Diagnosis present

## 2021-08-01 DIAGNOSIS — D509 Iron deficiency anemia, unspecified: Secondary | ICD-10-CM | POA: Diagnosis present

## 2021-08-01 DIAGNOSIS — Z8674 Personal history of sudden cardiac arrest: Secondary | ICD-10-CM

## 2021-08-01 DIAGNOSIS — R1110 Vomiting, unspecified: Secondary | ICD-10-CM | POA: Diagnosis present

## 2021-08-01 DIAGNOSIS — K760 Fatty (change of) liver, not elsewhere classified: Secondary | ICD-10-CM | POA: Diagnosis present

## 2021-08-01 DIAGNOSIS — Z8371 Family history of colonic polyps: Secondary | ICD-10-CM

## 2021-08-01 DIAGNOSIS — E739 Lactose intolerance, unspecified: Secondary | ICD-10-CM | POA: Diagnosis present

## 2021-08-01 DIAGNOSIS — E785 Hyperlipidemia, unspecified: Secondary | ICD-10-CM | POA: Diagnosis present

## 2021-08-01 DIAGNOSIS — R1084 Generalized abdominal pain: Secondary | ICD-10-CM | POA: Diagnosis present

## 2021-08-01 DIAGNOSIS — E039 Hypothyroidism, unspecified: Secondary | ICD-10-CM | POA: Diagnosis present

## 2021-08-01 DIAGNOSIS — Z79899 Other long term (current) drug therapy: Secondary | ICD-10-CM

## 2021-08-01 DIAGNOSIS — Z833 Family history of diabetes mellitus: Secondary | ICD-10-CM

## 2021-08-01 DIAGNOSIS — E860 Dehydration: Secondary | ICD-10-CM | POA: Diagnosis present

## 2021-08-01 DIAGNOSIS — E1110 Type 2 diabetes mellitus with ketoacidosis without coma: Secondary | ICD-10-CM | POA: Diagnosis present

## 2021-08-01 LAB — COMPREHENSIVE METABOLIC PANEL
ALT: 61 U/L — ABNORMAL HIGH (ref 0–44)
AST: 91 U/L — ABNORMAL HIGH (ref 15–41)
Albumin: 3.9 g/dL (ref 3.5–5.0)
Alkaline Phosphatase: 114 U/L (ref 38–126)
Anion gap: 16 — ABNORMAL HIGH (ref 5–15)
BUN: 8 mg/dL (ref 6–20)
CO2: 18 mmol/L — ABNORMAL LOW (ref 22–32)
Calcium: 9.6 mg/dL (ref 8.9–10.3)
Chloride: 104 mmol/L (ref 98–111)
Creatinine, Ser: 0.97 mg/dL (ref 0.44–1.00)
GFR, Estimated: >60 mL/min (ref >60)
Glucose, Bld: 220 mg/dL — ABNORMAL HIGH (ref 70–99)
Potassium: 4.3 mmol/L (ref 3.5–5.1)
Sodium: 138 mmol/L (ref 135–145)
Total Bilirubin: 1.6 mg/dL — ABNORMAL HIGH (ref 0.3–1.2)
Total Protein: 8.1 g/dL (ref 6.5–8.1)

## 2021-08-01 LAB — URINALYSIS, ROUTINE W REFLEX MICROSCOPIC
Bilirubin Urine: NEGATIVE
Glucose, UA: 50 mg/dL — AB
Hgb urine dipstick: NEGATIVE
Ketones, ur: 80 mg/dL — AB
Leukocytes,Ua: NEGATIVE
Nitrite: NEGATIVE
Protein, ur: 100 mg/dL — AB
Specific Gravity, Urine: 1.028 (ref 1.005–1.030)
pH: 5 (ref 5.0–8.0)

## 2021-08-01 LAB — LIPASE, BLOOD: Lipase: 32 U/L (ref 11–51)

## 2021-08-01 LAB — CBC
HCT: 50.5 % — ABNORMAL HIGH (ref 36.0–46.0)
Hemoglobin: 16.8 g/dL — ABNORMAL HIGH (ref 12.0–15.0)
MCH: 30.7 pg (ref 26.0–34.0)
MCHC: 33.3 g/dL (ref 30.0–36.0)
MCV: 92.3 fL (ref 80.0–100.0)
Platelets: 381 10*3/uL (ref 150–400)
RBC: 5.47 MIL/uL — ABNORMAL HIGH (ref 3.87–5.11)
RDW: 13 % (ref 11.5–15.5)
WBC: 12.4 10*3/uL — ABNORMAL HIGH (ref 4.0–10.5)
nRBC: 0 % (ref 0.0–0.2)

## 2021-08-01 LAB — CBG MONITORING, ED: Glucose-Capillary: 265 mg/dL — ABNORMAL HIGH (ref 70–99)

## 2021-08-01 LAB — PROTIME-INR
INR: 1.3 — ABNORMAL HIGH (ref 0.8–1.2)
Prothrombin Time: 15.7 seconds — ABNORMAL HIGH (ref 11.4–15.2)

## 2021-08-01 LAB — HEMOGLOBIN A1C
Hgb A1c MFr Bld: 7.2 % — ABNORMAL HIGH (ref 4.8–5.6)
Mean Plasma Glucose: 159.94 mg/dL

## 2021-08-01 LAB — HIV ANTIBODY (ROUTINE TESTING W REFLEX): HIV Screen 4th Generation wRfx: NONREACTIVE

## 2021-08-01 LAB — CK: Total CK: 117 U/L (ref 38–234)

## 2021-08-01 LAB — HCG, QUANTITATIVE, PREGNANCY: hCG, Beta Chain, Quant, S: 3 m[IU]/mL (ref <5)

## 2021-08-01 MED ORDER — IOHEXOL 300 MG/ML  SOLN
100.0000 mL | Freq: Once | INTRAMUSCULAR | Status: AC | PRN
  Administered 2021-08-01: 100 mL via INTRAVENOUS

## 2021-08-01 MED ORDER — ONDANSETRON HCL 4 MG/2ML IJ SOLN
4.0000 mg | Freq: Once | INTRAMUSCULAR | Status: AC
  Administered 2021-08-01: 4 mg via INTRAVENOUS
  Filled 2021-08-01: qty 2

## 2021-08-01 MED ORDER — FENTANYL CITRATE PF 50 MCG/ML IJ SOSY
50.0000 ug | PREFILLED_SYRINGE | INTRAMUSCULAR | Status: DC | PRN
  Administered 2021-08-01: 50 ug via INTRAVENOUS
  Filled 2021-08-01: qty 1

## 2021-08-01 MED ORDER — METOCLOPRAMIDE HCL 5 MG/ML IJ SOLN
10.0000 mg | Freq: Once | INTRAMUSCULAR | Status: AC
  Administered 2021-08-01: 10 mg via INTRAVENOUS
  Filled 2021-08-01: qty 2

## 2021-08-01 MED ORDER — HYOSCYAMINE SULFATE 0.125 MG SL SUBL
0.1250 mg | SUBLINGUAL_TABLET | Freq: Four times a day (QID) | SUBLINGUAL | Status: AC | PRN
  Filled 2021-08-01: qty 1

## 2021-08-01 MED ORDER — DICYCLOMINE HCL 10 MG/ML IM SOLN
20.0000 mg | Freq: Once | INTRAMUSCULAR | Status: AC
Start: 2021-08-01 — End: 2021-08-01
  Administered 2021-08-01: 20 mg via INTRAMUSCULAR
  Filled 2021-08-01: qty 2

## 2021-08-01 MED ORDER — MORPHINE SULFATE (PF) 4 MG/ML IV SOLN
4.0000 mg | Freq: Once | INTRAVENOUS | Status: AC
  Administered 2021-08-01: 4 mg via INTRAVENOUS
  Filled 2021-08-01: qty 1

## 2021-08-01 MED ORDER — FLUOXETINE HCL 20 MG PO CAPS
40.0000 mg | ORAL_CAPSULE | Freq: Every day | ORAL | Status: DC
  Administered 2021-08-01 – 2021-08-03 (×3): 40 mg via ORAL
  Filled 2021-08-01 (×4): qty 2

## 2021-08-01 MED ORDER — KETOROLAC TROMETHAMINE 30 MG/ML IJ SOLN
30.0000 mg | Freq: Four times a day (QID) | INTRAMUSCULAR | Status: DC | PRN
  Administered 2021-08-01 – 2021-08-03 (×4): 30 mg via INTRAVENOUS
  Filled 2021-08-01 (×4): qty 1

## 2021-08-01 MED ORDER — TIZANIDINE HCL 4 MG PO TABS
4.0000 mg | ORAL_TABLET | Freq: Three times a day (TID) | ORAL | Status: DC | PRN
  Administered 2021-08-02 – 2021-08-03 (×4): 4 mg via ORAL
  Filled 2021-08-01 (×6): qty 1

## 2021-08-01 MED ORDER — SODIUM CHLORIDE 0.9 % IV SOLN: 12.5000 mg | Freq: Four times a day (QID) | INTRAVENOUS | Status: DC | PRN

## 2021-08-01 MED ORDER — PROMETHAZINE HCL 25 MG PO TABS
25.0000 mg | ORAL_TABLET | Freq: Two times a day (BID) | ORAL | Status: DC | PRN
  Administered 2021-08-01 – 2021-08-02 (×2): 25 mg via ORAL
  Filled 2021-08-01 (×2): qty 1

## 2021-08-01 MED ORDER — PANTOPRAZOLE SODIUM 40 MG PO TBEC
40.0000 mg | DELAYED_RELEASE_TABLET | Freq: Every day | ORAL | Status: DC
  Administered 2021-08-01 – 2021-08-03 (×3): 40 mg via ORAL
  Filled 2021-08-01 (×3): qty 1

## 2021-08-01 MED ORDER — ARIPIPRAZOLE 5 MG PO TABS
5.0000 mg | ORAL_TABLET | Freq: Every day | ORAL | Status: DC
  Administered 2021-08-01 – 2021-08-03 (×3): 5 mg via ORAL
  Filled 2021-08-01 (×3): qty 1

## 2021-08-01 MED ORDER — SODIUM CHLORIDE 0.9 % IV SOLN: INTRAVENOUS | Status: DC

## 2021-08-01 MED ORDER — SIMETHICONE 80 MG PO CHEW
80.0000 mg | CHEWABLE_TABLET | Freq: Four times a day (QID) | ORAL | Status: DC | PRN
Start: 2021-08-01 — End: 2021-08-03
  Filled 2021-08-01: qty 1

## 2021-08-01 MED ORDER — OXYCODONE-ACETAMINOPHEN 5-325 MG PO TABS
1.5000 | ORAL_TABLET | Freq: Every day | ORAL | Status: DC
  Administered 2021-08-01 – 2021-08-03 (×9): 1.5 via ORAL
  Filled 2021-08-01 (×9): qty 2

## 2021-08-01 MED ORDER — SODIUM CHLORIDE 0.9 % IV BOLUS
2000.0000 mL | Freq: Once | INTRAVENOUS | Status: AC
  Administered 2021-08-01: 2000 mL via INTRAVENOUS

## 2021-08-01 MED ORDER — ENOXAPARIN SODIUM 40 MG/0.4ML IJ SOSY
40.0000 mg | PREFILLED_SYRINGE | INTRAMUSCULAR | Status: DC
  Administered 2021-08-01: 40 mg via SUBCUTANEOUS
  Filled 2021-08-01: qty 0.4

## 2021-08-01 MED ORDER — ATORVASTATIN CALCIUM 10 MG PO TABS: 10.0000 mg | ORAL_TABLET | Freq: Every day | ORAL | Status: DC

## 2021-08-01 MED ORDER — PRAZOSIN HCL 2 MG PO CAPS
2.0000 mg | ORAL_CAPSULE | Freq: Every day | ORAL | Status: DC
Start: 2021-08-01 — End: 2021-08-03
  Administered 2021-08-01 – 2021-08-02 (×2): 2 mg via ORAL
  Filled 2021-08-01 (×4): qty 1

## 2021-08-01 MED ORDER — ONDANSETRON HCL 4 MG/2ML IJ SOLN
4.0000 mg | Freq: Once | INTRAMUSCULAR | Status: AC | PRN
  Administered 2021-08-01: 4 mg via INTRAVENOUS
  Filled 2021-08-01: qty 2

## 2021-08-01 MED ORDER — LEVOTHYROXINE SODIUM 25 MCG PO TABS
25.0000 ug | ORAL_TABLET | Freq: Every morning | ORAL | Status: DC
  Administered 2021-08-02 – 2021-08-03 (×2): 25 ug via ORAL
  Filled 2021-08-01 (×2): qty 1

## 2021-08-01 MED ORDER — INSULIN ASPART 100 UNIT/ML IJ SOLN
0.0000 [IU] | Freq: Three times a day (TID) | INTRAMUSCULAR | Status: DC
  Administered 2021-08-02: 3 [IU] via SUBCUTANEOUS
  Administered 2021-08-02: 2 [IU] via SUBCUTANEOUS
  Administered 2021-08-02: 5 [IU] via SUBCUTANEOUS
  Administered 2021-08-03: 2 [IU] via SUBCUTANEOUS

## 2021-08-01 MED ORDER — CARVEDILOL 6.25 MG PO TABS
6.2500 mg | ORAL_TABLET | Freq: Two times a day (BID) | ORAL | Status: DC
  Administered 2021-08-02 – 2021-08-03 (×3): 6.25 mg via ORAL
  Filled 2021-08-01: qty 2
  Filled 2021-08-01 (×2): qty 1

## 2021-08-01 MED ORDER — SODIUM CHLORIDE 0.9 % IV SOLN
12.5000 mg | Freq: Once | INTRAVENOUS | Status: AC
  Administered 2021-08-01: 12.5 mg via INTRAVENOUS
  Filled 2021-08-01: qty 0.5

## 2021-08-01 MED ORDER — TRAZODONE HCL 50 MG PO TABS
50.0000 mg | ORAL_TABLET | Freq: Two times a day (BID) | ORAL | Status: DC
Start: 2021-08-01 — End: 2021-08-03
  Administered 2021-08-01 – 2021-08-03 (×4): 50 mg via ORAL
  Filled 2021-08-01 (×4): qty 1

## 2021-08-01 MED ORDER — METOCLOPRAMIDE HCL 5 MG/ML IJ SOLN
10.0000 mg | Freq: Four times a day (QID) | INTRAMUSCULAR | Status: DC | PRN
  Administered 2021-08-01 – 2021-08-03 (×4): 10 mg via INTRAVENOUS
  Filled 2021-08-01 (×4): qty 2

## 2021-08-01 MED ORDER — ACETAMINOPHEN 325 MG PO TABS: 650.0000 mg | ORAL_TABLET | Freq: Four times a day (QID) | ORAL | Status: DC | PRN

## 2021-08-01 MED ORDER — LABETALOL HCL 5 MG/ML IV SOLN: 20.0000 mg | INTRAVENOUS | Status: DC | PRN

## 2021-08-01 NOTE — ED Notes (Signed)
Unable to urinate at this time.  

## 2021-08-01 NOTE — ED Notes (Signed)
Pt w continued emesis, bile noted in emesis bag. PRN reglan to be given per orders ?

## 2021-08-01 NOTE — ED Provider Triage Note (Signed)
Emergency Medicine Provider Triage Evaluation Note ? ?[name] , a 51 y.o. female  was evaluated in triage.  Pt complains of abdominal pain.  Located to left lower quadrant, and lower mid abdomen.  Does not radiate to back.  She has had multiple episodes of NBNB emesis.  Also having diarrhea without melena or blood per rectum.  No fever, chest pain, shortness of breath, urinary complaints. No recent abx, travel, sick contacts ? ?Review of Systems  ?Positive: Abdominal pain, nausea, vomiting, diarrhea ?Negative: Fever, CP, SOB ? ?Physical Exam  ?BP (!) 151/93 (BP Location: Right Arm)   Pulse (!) 120   Temp 98.5 ?F (36.9 ?C) (Oral)   Resp (!) 22   LMP 06/03/2018 (Approximate) Comment: neg hcg 05/10/20  SpO2 94%  ?Gen:   Awake, no distress   ?Resp:  Normal effort  ?MSK:   Moves extremities without difficulty  ?ABD:  Diffuse tenderness, worse to left lower quadrant ?Other:   ? ?Medical Decision Making  ?Medically screening exam initiated at 3:29 AM.  Appropriate orders placed.  [name] was informed that the remainder of the evaluation will be completed by another provider, this initial triage assessment does not replace that evaluation, and the importance of remaining in the ED until their evaluation is complete. ? ?N/V/D, abd pain ?  ?Vincenza Dail A, PA-C ?08/01/21 0331 ? ?

## 2021-08-01 NOTE — ED Provider Notes (Signed)
?[place] ?Provider Note ? ? ?CSN: [id number] ?Arrival date & time: 08/01/21  0309 ? ?  ? ?History ? ?Chief Complaint  ?Patient presents with  ? Emesis  ? Abdominal Pain  ? ? ?[name] is a 51 y.o. female. ? ?51 year old female with past medical history of depression, history of cardiac arrest presents today for evaluation of abdominal pain, nausea and vomiting since yesterday.  Patient points to her suprapubic region as the area of discomfort.  States she has vomited about 30 times since onset.  She describes her emesis as nonbloody and nonbilious.  Endorses diarrhea as well.  Denies blood per rectum.  Denies alcohol use.  Denies fever but endorses chills.  States she had a similar episode a few years ago but is unsure of what her diagnosis was.  Denies any history of abdominal surgeries.  Currently rates her pain as a 8/10.  She was given fentanyl earlier with minimal relief.  She also endorses dysuria that started yesterday.  She is without lightheadedness. ? ?The history is provided by the patient. No language interpreter was used.  ?Abdominal Pain ?Associated symptoms: diarrhea, dysuria, nausea and vomiting   ?Associated symptoms: no chest pain, no chills, no fever and no shortness of breath   ? ?  ? ?Home Medications ?Prior to Admission medications   ?Medication Sig Start Date End Date Taking? Authorizing Provider  ?acetaminophen (TYLENOL) 325 MG tablet Take 2 tablets (650 mg total) by mouth every 6 (six) hours as needed for mild pain, fever or headache. 07/01/20   Angiulli, Lavon Paganini, PA-C  ?ARIPiprazole (ABILIFY) 5 MG tablet Take 1 tablet (5 mg total) by mouth daily. 08/01/20   Jamse Arn, MD  ?atorvastatin (LIPITOR) 10 MG tablet Take 10 mg by mouth daily. 08/26/20   [provider]  ?carvedilol (COREG) 6.25 MG tablet Take 1 tablet (6.25 mg total) by mouth 2 (two) times daily with a meal. 08/01/20   Patel, Domenick Bookbinder, MD  ?Arna Medici 25 MCG tablet Take  25 mcg by mouth every morning. 08/26/20   [provider]  ?ferrous sulfate 325 (65 FE) MG tablet Take 1 tablet (325 mg total) by mouth daily with breakfast. 07/01/20   Angiulli, Lavon Paganini, PA-C  ?FLUoxetine (PROZAC) 40 MG capsule Take 1 capsule (40 mg total) by mouth daily. 07/01/20   Angiulli, Lavon Paganini, PA-C  ?melatonin 300 MCG TABS Take 5 tablets (1,500 mcg total) by mouth at bedtime. 07/01/20   Angiulli, Lavon Paganini, PA-C  ?metoCLOPramide (REGLAN) 10 MG tablet Take 1 tablet (10 mg total) by mouth 3 (three) times daily before meals. 07/01/20   Angiulli, Lavon Paganini, PA-C  ?multivitamin (RENA-VIT) TABS tablet Take 1 tablet by mouth at bedtime. 08/01/20   Jamse Arn, MD  ?omeprazole (PRILOSEC) 20 MG capsule Take 1 capsule (20 mg total) by mouth daily. 08/01/20   Jamse Arn, MD  ?prazosin (MINIPRESS) 2 MG capsule Take 1 capsule (2 mg total) by mouth at bedtime. 08/01/20   Jamse Arn, MD  ?traZODone (DESYREL) 50 MG tablet Take by mouth. 10/28/20   [provider]  ?   ? ?Allergies    ?Lactose intolerance (gi)   ? ?Review of Systems   ?Review of Systems  ?Constitutional:  Negative for activity change, chills and fever.  ?Respiratory:  Negative for shortness of breath.   ?Cardiovascular:  Negative for chest pain.  ?Gastrointestinal:  Positive for diarrhea, nausea and vomiting. Negative for abdominal  distention.  ?Genitourinary:  Positive for dysuria.  ?Neurological:  Negative for weakness and light-headedness.  ?All other systems reviewed and are negative. ? ?Physical Exam ?Updated Vital Signs ?BP (!) 176/120 (BP Location: Right Arm)   Pulse (!) 106   Temp 98.7 ?F (37.1 ?C) (Oral)   Resp 12   LMP 06/03/2018 (Approximate) Comment: neg hcg 05/10/20  SpO2 97%  ?Physical Exam ?Vitals and nursing note reviewed.  ?Constitutional:   ?   General: She is not in acute distress. ?   Appearance: Normal appearance. She is obese. She is not ill-appearing.  ?HENT:  ?   Head: Normocephalic and atraumatic.  ?    Nose: Nose normal.  ?Eyes:  ?   General: No scleral icterus. ?   Extraocular Movements: Extraocular movements intact.  ?   Conjunctiva/sclera: Conjunctivae normal.  ?Cardiovascular:  ?   Rate and Rhythm: Normal rate and regular rhythm.  ?   Pulses: Normal pulses.  ?   Heart sounds: Normal heart sounds.  ?Pulmonary:  ?   Effort: Pulmonary effort is normal. No respiratory distress.  ?   Breath sounds: Normal breath sounds. No wheezing or rales.  ?Abdominal:  ?   General: There is no distension.  ?   Palpations: Abdomen is soft.  ?   Tenderness: There is abdominal tenderness. There is no right CVA tenderness, left CVA tenderness or guarding.  ?Musculoskeletal:     ?   General: Normal range of motion.  ?   Cervical back: Normal range of motion.  ?Skin: ?   General: Skin is warm and dry.  ?Neurological:  ?   General: No focal deficit present.  ?   Mental Status: She is alert. Mental status is at baseline.  ? ? ?ED Results / Procedures / Treatments   ?Labs ?(all labs ordered are listed, but only abnormal results are displayed) ?Labs Reviewed  ?COMPREHENSIVE METABOLIC PANEL - Abnormal; Notable for the following components:  ?    Result Value  ? CO2 18 (*)   ? Glucose, Bld 220 (*)   ? AST 91 (*)   ? ALT 61 (*)   ? Total Bilirubin 1.6 (*)   ? Anion gap 16 (*)   ? All other components within normal limits  ?CBC - Abnormal; Notable for the following components:  ? WBC 12.4 (*)   ? RBC 5.47 (*)   ? Hemoglobin 16.8 (*)   ? HCT 50.5 (*)   ? All other components within normal limits  ?LIPASE, BLOOD  ?HCG, QUANTITATIVE, PREGNANCY  ?URINALYSIS, ROUTINE W REFLEX MICROSCOPIC  ?I-STAT BETA HCG BLOOD, ED ([facility], [facility], [facility] ONLY)  ? ? ?EKG ?None ? ?Radiology ?CT ABDOMEN PELVIS W CONTRAST ? ?Result Date: 08/01/2021 ?CLINICAL DATA:  51 year old female with history of left lower quadrant abdominal pain. Emesis. EXAM: CT ABDOMEN AND PELVIS WITH CONTRAST TECHNIQUE: Multidetector CT imaging of the abdomen and pelvis was performed using the standard  protocol following bolus administration of intravenous contrast. RADIATION DOSE REDUCTION: This exam was performed according to the departmental dose-optimization program which includes automated exposure control, adjustment of the mA and/or kV according to patient size and/or use of iterative reconstruction technique. CONTRAST:  157m OMNIPAQUE IOHEXOL 300 MG/ML  SOLN COMPARISON:  CT the abdomen and pelvis 05/10/2020. FINDINGS: Lower chest: Unremarkable. Hepatobiliary: Severe diffuse low attenuation throughout the hepatic parenchyma, indicative of a background of severe hepatic steatosis. No suspicious cystic or solid hepatic lesions. No intra or extrahepatic biliary ductal dilatation. Gallbladder is  unremarkable in appearance. Pancreas: No pancreatic mass. No pancreatic ductal dilatation. No pancreatic or peripancreatic fluid collections or inflammatory changes. Spleen: Unremarkable. Adrenals/Urinary Tract: Bilateral kidneys and bilateral adrenal glands are normal in appearance. No hydroureteronephrosis. Urinary bladder is normal in appearance. Stomach/Bowel: Normal appearance of the stomach. No pathologic dilatation of small bowel or colon. Normal appendix. Vascular/Lymphatic: Aortic atherosclerosis, without evidence of aneurysm or dissection noted in the abdominal or pelvic vasculature. No lymphadenopathy noted in the abdomen or pelvis. Reproductive: Uterus and ovaries are unremarkable in appearance. Other: No significant volume of ascites.  No pneumoperitoneum. Musculoskeletal: Old healed fractures of the inferior pubic rami bilaterally. There are no aggressive appearing lytic or blastic lesions noted in the visualized portions of the skeleton. IMPRESSION: 1. No acute findings are noted in the abdomen or pelvis to account for the patient's symptoms. 2. Severe hepatic steatosis. 3. Aortic atherosclerosis. Electronically Signed   By: Vinnie Langton M.D.   On: 08/01/2021 05:38   ? ?Procedures ?Procedures   ? ? ?Medications Ordered in ED ?Medications  ?sodium chloride 0.9 % bolus 2,000 mL (has no administration in time range)  ?morphine (PF) 4 MG/ML injection 4 mg (has no administration in time range)  ?ondansetron (

## 2021-08-01 NOTE — ED Notes (Signed)
IV attempt unsuccessful

## 2021-08-01 NOTE — ED Provider Notes (Signed)
Care assumed from [facility], [place], at shift change, please see their notes for full documentation of patient's complaint/HPI. Briefly, pt here with abdominal pain, nausea, vomiting, diarrhea. Results so far show unremarkable CT A/P. Awaiting reevaluate after additional IV hydration and antiemetics. Plan is to dispo accordingly +/- admission.  ? ?Physical Exam  ?BP (!) 190/95 (BP Location: Right Arm)   Pulse (!) 101   Temp 98.7 ?F (37.1 ?C) (Oral)   Resp 15   LMP 06/03/2018 (Approximate) Comment: neg hcg 05/10/20  SpO2 95%  ? ?Physical Exam ?Vitals and nursing note reviewed.  ?Constitutional:   ?   Appearance: She is not ill-appearing.  ?HENT:  ?   Head: Normocephalic and atraumatic.  ?Eyes:  ?   Conjunctiva/sclera: Conjunctivae normal.  ?Cardiovascular:  ?   Rate and Rhythm: Normal rate and regular rhythm.  ?Pulmonary:  ?   Effort: Pulmonary effort is normal.  ?   Breath sounds: Normal breath sounds.  ?Skin: ?   General: Skin is warm and dry.  ?   Coloration: Skin is not jaundiced.  ?Neurological:  ?   Mental Status: She is alert.  ? ? ?Procedures  ?Procedures ? ?ED Course / MDM  ? ?Clinical Course as of 08/01/21 1800  ?Tue Aug 01, 2021  ?1258 Patient on reevaluation appears uncomfortable.  Has an emesis bag with some emesis in it.  States this is a new back since I last saw her.  Still reports severe pain.  No improvement after dose of morphine.  IV fluids are infusing.  We will provide her with Reglan and Phenergan.  She states she will try and give [place] a urine sample. [AA]  ?  ?Clinical Course User Index ?Maudie Mercury, PA-C  ? ?Medical Decision Making ?On reevaluation after signout patient's 2 L fluids are disconnected.  She apparently went to the bathroom and she was never reconnected.  They are almost full.  We will plan for reevaluation after fluids are done running as well as additional Phenergan provided for symptomatic relief.  Patient continues to complain of abdominal pain.  CT scan negative.   Previous provider suspected gastroenteritis.  Bentyl IM provided. ? ?On reevaluation patient reports that she continues to have pain.  She continues to vomit despite multiple antiemetics at this time.  We will plan to admit for intractable nausea and vomiting.  Does appear she has been admitted in the past for same.  ? ?Discussed case with [facility] Dr. Roosevelt Locks who agrees to accept patient for admission.  ? ?Amount and/or Complexity of Data Reviewed ?Labs: ordered. ? ?Risk ?Prescription drug management. ?Decision regarding hospitalization. ? ? ? ? ? ? ? ?  ?Eustaquio Maize, PA-C ?08/01/21 1835 ? ?  ?Wyvonnia Dusky, MD ?08/01/21 1926 ? ?

## 2021-08-01 NOTE — ED Triage Notes (Signed)
Pt c/o mid, LLQ abd pain with NV since yesterday. Took zofran at home without improvement.  ?

## 2021-08-01 NOTE — H&P (Signed)
?History and Physical  ? ? ?SCARLETTROSE COSTILOW ZDG:[id number] DOB: 1970/10/09 DOA: 08/01/2021 ? ?PCP: Andreas Blower, MD (Confirm with patient/family/NH records and if not entered, this has to be entered at [facility] point of entry) ?Patient coming from: Home ? ?I have personally briefly reviewed patient's old medical records in [place] ? ?Chief Complaint: Intractable nauseous vomiting, sharp abdominal pain and diarrhea ? ?HPI: [name] is a 51 y.o. female with medical history significant of HTN, GERD, Barrett esophagus, anxiety/depression, HLD, chronic iron deficiency anemia, presented with new onset of nausea with vomiting abdominal pain and diarrhea. ? ?Symptoms started yesterday evening, with new onset of nauseous, vomiting of stomach content 10-15 times since yesterday, of stomach content, nonbloody nonbilious, sharp-like periumbilical area pain, and loose diarrhea x3 since yesterday.  Sharp-like abdominal pain has been constant, denies any tenesmus.  Feeling episode of chills but no fever.  Denies any cough no shortness of breath no chest pain.  He denied any recent new medications, denied marijuana use.  She has not been able to eat or drink since yesterday. ? ?ED Course: Tachycardia, blood pressure elevated afebrile. ? ?CT abdomen pelvis, no acute findings other than severe fatty liver.  AST 90, ALT 60, bilirubin 1.6. ? ?Review of Systems: As per HPI otherwise 14 point review of systems negative.  ? ? ?Past Medical History:  ?Diagnosis Date  ? Anxiety   ? Arthritis   ? "joints ache all over" (10/15/2014)  ? Barrett's esophagus   ? Bulging lumbar disc   ? Chronic lower back pain   ? DDD (degenerative disc disease), cervical   ? Depression   ? Drug-seeking behavior   ? Headache   ? "weekly" (10/15/2014)  ? Hyperlipemia   ? Hypertension   ? PTSD (post-traumatic stress disorder)   ? Skin cancer   ? "had them cut off my arms; don't know what kind"  ? ? ?Past Surgical History:  ?Procedure Laterality  Date  ? ABLATION ON ENDOMETRIOSIS  2008  ? BIOPSY  12/27/2018  ? Procedure: BIOPSY;  Surgeon: Thornton Park, MD;  Location: [facility];  Service: Gastroenterology;;  ? ESOPHAGOGASTRODUODENOSCOPY (EGD) WITH PROPOFOL N/A 12/27/2018  ? Procedure: ESOPHAGOGASTRODUODENOSCOPY (EGD) WITH PROPOFOL;  Surgeon: Thornton Park, MD;  Location: [facility];  Service: Gastroenterology;  Laterality: N/A;  ? HEMORRHOID SURGERY  ~ 2002  ? IR FLUORO GUIDE CV LINE RIGHT  06/14/2020  ? IR REMOVAL TUN CV CATH W/O FL  06/23/2020  ? IR US GUIDE VASC ACCESS RIGHT  06/14/2020  ? ORIF ANKLE FRACTURE Right 03/28/2020  ? Procedure: OPEN REDUCTION INTERNAL FIXATION (ORIF) RIGHT BIMALLEOLAR ANKLE FRACTURE;  Surgeon: Marchia Bond, MD;  Location: [place];  Service: Orthopedics;  Laterality: Right;  ? ? ? reports that she has never smoked. She has never used smokeless tobacco. She reports that she does not currently use alcohol. She reports that she does not use drugs. ? ?Allergies  ?Allergen Reactions  ? Lactose Intolerance (Gi) Nausea Only  ? ? ?Family History  ?Problem Relation Age of Onset  ? Breast cancer Mother   ? Diabetes Mother   ? Breast cancer Maternal Grandmother   ? Breast cancer Paternal Grandmother   ? Colon polyps Paternal Grandmother   ? Colon cancer Neg Hx   ? Esophageal cancer Neg Hx   ? Rectal cancer Neg Hx   ? Stomach cancer Neg Hx   ? ? ? ?Prior to Admission medications   ?Medication Sig Start  Date End Date Taking? Authorizing Provider  ?ARIPiprazole (ABILIFY) 5 MG tablet Take 1 tablet (5 mg total) by mouth daily. 08/01/20  Yes Jamse Arn, MD  ?atorvastatin (LIPITOR) 10 MG tablet Take 10 mg by mouth daily. 08/26/20  Yes [provider]  ?carvedilol (COREG) 6.25 MG tablet Take 1 tablet (6.25 mg total) by mouth 2 (two) times daily with a meal. 08/01/20  Yes Jamse Arn, MD  ?diphenhydrAMINE HCl (BENADRYL PO) Take 3 tablets by mouth at bedtime as needed (sleep).   Yes [provider]   ?EUTHYROX 25 MCG tablet Take 25 mcg by mouth every morning. 08/26/20  Yes [provider]  ?FLUoxetine (PROZAC) 40 MG capsule Take 1 capsule (40 mg total) by mouth daily. 07/01/20  Yes Angiulli, Lavon Paganini, PA-C  ?multivitamin (RENA-VIT) TABS tablet Take 1 tablet by mouth at bedtime. 08/01/20  Yes Jamse Arn, MD  ?omeprazole (PRILOSEC) 20 MG capsule Take 1 capsule (20 mg total) by mouth daily. 08/01/20  Yes Jamse Arn, MD  ?ondansetron (ZOFRAN) 4 MG tablet Take 4 mg by mouth daily as needed for nausea or vomiting.   Yes [provider]  ?ondansetron (ZOFRAN-ODT) 4 MG disintegrating tablet Take 4 mg by mouth daily as needed for nausea or vomiting.   Yes [provider]  ?oxyCODONE-acetaminophen (PERCOCET) 7.5-325 MG tablet Take 1 tablet by mouth See admin instructions. Take 7.5 - 325 mg 5 times daily   Yes [provider]  ?prazosin (MINIPRESS) 2 MG capsule Take 1 capsule (2 mg total) by mouth at bedtime. 08/01/20  Yes Jamse Arn, MD  ?promethazine (PHENERGAN) 25 MG suppository Place 25 mg rectally daily as needed for nausea or vomiting.   Yes [provider]  ?promethazine (PHENERGAN) 25 MG tablet Take 25 mg by mouth 2 (two) times daily as needed for nausea or vomiting. 04/11/21  Yes [provider]  ?Simethicone (GAS-X PO) Take 2 tablets by mouth daily as needed (gas).   Yes [provider]  ?tiZANidine (ZANAFLEX) 4 MG capsule Take 4 mg by mouth in the morning, at noon, in the evening, and at bedtime.   Yes [provider]  ?traZODone (DESYREL) 50 MG tablet Take 50 mg by mouth in the morning and at bedtime. 10/28/20  Yes [provider]  ?acetaminophen (TYLENOL) 325 MG tablet Take 2 tablets (650 mg total) by mouth every 6 (six) hours as needed for mild pain, fever or headache. ?Patient taking differently: Take 650 mg by mouth 2 (two) times daily as needed for mild pain, fever or headache. 07/01/20   Angiulli, Lavon Paganini, PA-C   ?ferrous sulfate 325 (65 FE) MG tablet Take 1 tablet (325 mg total) by mouth daily with breakfast. ?Patient not taking: Reported on 08/01/2021 07/01/20   Angiulli, Lavon Paganini, PA-C  ?melatonin 300 MCG TABS Take 5 tablets (1,500 mcg total) by mouth at bedtime. ?Patient not taking: Reported on 08/01/2021 07/01/20   Angiulli, Lavon Paganini, PA-C  ?metoCLOPramide (REGLAN) 10 MG tablet Take 1 tablet (10 mg total) by mouth 3 (three) times daily before meals. 07/01/20   Angiulli, Lavon Paganini, PA-C  ? ? ?Physical Exam: ?Vitals:  ? 08/01/21 0552 08/01/21 0946 08/01/21 1045 08/01/21 1526  ?BP: (!) 160/120 (!) 176/120 (!) 240/193 (!) 190/95  ?Pulse: (!) 118 (!) 106 100 (!) 101  ?Resp: '18 12 15 15  '$ ?Temp:  98.7 ?F (37.1 ?C)    ?TempSrc:  Oral    ?SpO2: 94% 97% 97% 95%  ? ? ?  Constitutional: NAD, calm, comfortable ?Vitals:  ? 08/01/21 0552 08/01/21 0946 08/01/21 1045 08/01/21 1526  ?BP: (!) 160/120 (!) 176/120 (!) 240/193 (!) 190/95  ?Pulse: (!) 118 (!) 106 100 (!) 101  ?Resp: '18 12 15 15  '$ ?Temp:  98.7 ?F (37.1 ?C)    ?TempSrc:  Oral    ?SpO2: 94% 97% 97% 95%  ? ?Eyes: PERRL, lids and conjunctivae normal ?ENMT: Mucous membranes are dry. Posterior pharynx clear of any exudate or lesions.Normal dentition.  ?Neck: normal, supple, no masses, no thyromegaly ?Respiratory: clear to auscultation bilaterally, no wheezing, no crackles. Normal respiratory effort. No accessory muscle use.  ?Cardiovascular: Regular rate and rhythm, no murmurs / rubs / gallops. No extremity edema. 2+ pedal pulses. No carotid bruits.  ?Abdomen: mild tenderness on periumbilical area, no rebound no guarding, no masses palpated. No hepatosplenomegaly. Bowel sounds positive.  ?Musculoskeletal: no clubbing / cyanosis. No joint deformity upper and lower extremities. Good ROM, no contractures. Normal muscle tone.  ?Skin: no rashes, lesions, ulcers. No induration ?Neurologic: CN 2-12 grossly intact. Sensation intact, DTR normal. Strength 5/5 in all 4.  ?Psychiatric: Normal judgment  and insight. Alert and oriented x 3. Normal mood.  ? ? ? ?Labs on Admission: I have personally reviewed following labs and imaging studies ? ?CBC: ?Recent Labs  ?Lab 08/01/21 ?4166  ?WBC 12.4*  ?HGB 16.

## 2021-08-02 ENCOUNTER — Encounter (HOSPITAL_COMMUNITY): Payer: Self-pay | Admitting: Internal Medicine

## 2021-08-02 DIAGNOSIS — F4310 Post-traumatic stress disorder, unspecified: Secondary | ICD-10-CM | POA: Diagnosis present

## 2021-08-02 DIAGNOSIS — K760 Fatty (change of) liver, not elsewhere classified: Secondary | ICD-10-CM | POA: Diagnosis present

## 2021-08-02 DIAGNOSIS — N809 Endometriosis, unspecified: Secondary | ICD-10-CM | POA: Diagnosis present

## 2021-08-02 DIAGNOSIS — E1110 Type 2 diabetes mellitus with ketoacidosis without coma: Secondary | ICD-10-CM | POA: Diagnosis present

## 2021-08-02 DIAGNOSIS — R000 Tachycardia, unspecified: Secondary | ICD-10-CM | POA: Diagnosis present

## 2021-08-02 DIAGNOSIS — D509 Iron deficiency anemia, unspecified: Secondary | ICD-10-CM | POA: Diagnosis present

## 2021-08-02 DIAGNOSIS — Z833 Family history of diabetes mellitus: Secondary | ICD-10-CM | POA: Diagnosis not present

## 2021-08-02 DIAGNOSIS — Z79899 Other long term (current) drug therapy: Secondary | ICD-10-CM | POA: Diagnosis not present

## 2021-08-02 DIAGNOSIS — E876 Hypokalemia: Secondary | ICD-10-CM | POA: Diagnosis present

## 2021-08-02 DIAGNOSIS — I10 Essential (primary) hypertension: Secondary | ICD-10-CM | POA: Diagnosis present

## 2021-08-02 DIAGNOSIS — I700 Atherosclerosis of aorta: Secondary | ICD-10-CM

## 2021-08-02 DIAGNOSIS — E785 Hyperlipidemia, unspecified: Secondary | ICD-10-CM | POA: Diagnosis present

## 2021-08-02 DIAGNOSIS — R1110 Vomiting, unspecified: Secondary | ICD-10-CM | POA: Diagnosis present

## 2021-08-02 DIAGNOSIS — R7989 Other specified abnormal findings of blood chemistry: Secondary | ICD-10-CM

## 2021-08-02 DIAGNOSIS — R1084 Generalized abdominal pain: Secondary | ICD-10-CM

## 2021-08-02 DIAGNOSIS — Z803 Family history of malignant neoplasm of breast: Secondary | ICD-10-CM | POA: Diagnosis not present

## 2021-08-02 DIAGNOSIS — E669 Obesity, unspecified: Secondary | ICD-10-CM | POA: Diagnosis present

## 2021-08-02 DIAGNOSIS — E8809 Other disorders of plasma-protein metabolism, not elsewhere classified: Secondary | ICD-10-CM

## 2021-08-02 DIAGNOSIS — F32A Depression, unspecified: Secondary | ICD-10-CM | POA: Diagnosis present

## 2021-08-02 DIAGNOSIS — Z8371 Family history of colonic polyps: Secondary | ICD-10-CM | POA: Diagnosis not present

## 2021-08-02 DIAGNOSIS — K529 Noninfective gastroenteritis and colitis, unspecified: Secondary | ICD-10-CM | POA: Diagnosis present

## 2021-08-02 DIAGNOSIS — E782 Mixed hyperlipidemia: Secondary | ICD-10-CM

## 2021-08-02 DIAGNOSIS — E860 Dehydration: Secondary | ICD-10-CM | POA: Diagnosis present

## 2021-08-02 DIAGNOSIS — K219 Gastro-esophageal reflux disease without esophagitis: Secondary | ICD-10-CM | POA: Diagnosis present

## 2021-08-02 DIAGNOSIS — Z6835 Body mass index (BMI) 35.0-35.9, adult: Secondary | ICD-10-CM | POA: Diagnosis not present

## 2021-08-02 DIAGNOSIS — Z85828 Personal history of other malignant neoplasm of skin: Secondary | ICD-10-CM | POA: Diagnosis not present

## 2021-08-02 DIAGNOSIS — E039 Hypothyroidism, unspecified: Secondary | ICD-10-CM | POA: Diagnosis present

## 2021-08-02 DIAGNOSIS — E119 Type 2 diabetes mellitus without complications: Secondary | ICD-10-CM

## 2021-08-02 DIAGNOSIS — G894 Chronic pain syndrome: Secondary | ICD-10-CM | POA: Diagnosis present

## 2021-08-02 DIAGNOSIS — E739 Lactose intolerance, unspecified: Secondary | ICD-10-CM | POA: Diagnosis present

## 2021-08-02 LAB — BASIC METABOLIC PANEL
Anion gap: 13 (ref 5–15)
BUN: <5 mg/dL — ABNORMAL LOW (ref 6–20)
CO2: 19 mmol/L — ABNORMAL LOW (ref 22–32)
Calcium: 8.6 mg/dL — ABNORMAL LOW (ref 8.9–10.3)
Chloride: 113 mmol/L — ABNORMAL HIGH (ref 98–111)
Creatinine, Ser: 0.88 mg/dL (ref 0.44–1.00)
GFR, Estimated: >60 mL/min (ref >60)
Glucose, Bld: 236 mg/dL — ABNORMAL HIGH (ref 70–99)
Potassium: 3.2 mmol/L — ABNORMAL LOW (ref 3.5–5.1)
Sodium: 145 mmol/L (ref 135–145)

## 2021-08-02 LAB — HEPATIC FUNCTION PANEL
ALT: 42 U/L (ref 0–44)
AST: 44 U/L — ABNORMAL HIGH (ref 15–41)
Albumin: 3.4 g/dL — ABNORMAL LOW (ref 3.5–5.0)
Alkaline Phosphatase: 77 U/L (ref 38–126)
Bilirubin, Direct: 0.1 mg/dL (ref 0.0–0.2)
Indirect Bilirubin: 0.7 mg/dL (ref 0.3–0.9)
Total Bilirubin: 0.8 mg/dL (ref 0.3–1.2)
Total Protein: 6.6 g/dL (ref 6.5–8.1)

## 2021-08-02 LAB — RAPID URINE DRUG SCREEN, HOSP PERFORMED
Amphetamines: POSITIVE — AB
Barbiturates: NOT DETECTED
Benzodiazepines: NOT DETECTED
Cocaine: NOT DETECTED
Opiates: POSITIVE — AB
Tetrahydrocannabinol: NOT DETECTED

## 2021-08-02 LAB — CBG MONITORING, ED
Glucose-Capillary: 224 mg/dL — ABNORMAL HIGH (ref 70–99)
Glucose-Capillary: 155 mg/dL — ABNORMAL HIGH (ref 70–99)

## 2021-08-02 LAB — CBC
HCT: 39.6 % (ref 36.0–46.0)
Hemoglobin: 13 g/dL (ref 12.0–15.0)
MCH: 30.7 pg (ref 26.0–34.0)
MCHC: 32.8 g/dL (ref 30.0–36.0)
MCV: 93.4 fL (ref 80.0–100.0)
Platelets: 255 10*3/uL (ref 150–400)
RBC: 4.24 MIL/uL (ref 3.87–5.11)
RDW: 13.8 % (ref 11.5–15.5)
WBC: 15.3 10*3/uL — ABNORMAL HIGH (ref 4.0–10.5)
nRBC: 0 % (ref 0.0–0.2)

## 2021-08-02 LAB — GLUCOSE, CAPILLARY: Glucose-Capillary: 122 mg/dL — ABNORMAL HIGH (ref 70–99)

## 2021-08-02 MED ORDER — LACTATED RINGERS IV BOLUS
500.0000 mL | Freq: Once | INTRAVENOUS | Status: AC
  Administered 2021-08-02: 500 mL via INTRAVENOUS

## 2021-08-02 MED ORDER — POTASSIUM CHLORIDE 2 MEQ/ML IV SOLN
INTRAVENOUS | Status: DC
  Filled 2021-08-02 (×6): qty 1000

## 2021-08-02 NOTE — Progress Notes (Signed)
?Progress Note ? ?Patient: [name] ZOX:[id number] DOB: 05-19-1970  ?DOA: 08/01/2021  DOS: 08/02/2021  ?  ?Brief hospital course: ?[name] is a 51 y.o. female with a history of HTN, GERD, Barrett's esophagus, anxiety/depression, chronic pain, HLD, iron deficiency anemia, and cardiac arrest Feb 2022 who presented to the ED 4/11 with intractable nausea, vomiting and abdominal pain. She was tachycardic with hypertension, but afebrile with WBC elevated. LFTs were modestly elevated (AST 90, ALT 60). CT abd/pelvis revealed hepatic steatosis without acute findings to explain symptoms. She was placed on IVF, given IV antiemetics and admitted.  ? ?Assessment and Plan: ?Dehydration due to intractable nausea and vomiting: HCG negative, CT abd/pelvis reassuring. Leading possibility gastroenteritis (ate salad prior to illness), less likely that this is primarily DKA. HIV NR. ?- Continue IVF. With hypokalemia, supplement in IVF and monitor in AM with Mag ?- Continue supportive care, antiemetics, phenergan, reglan and prn toradol ?  ?Mild DKA: Technically her presentation is compatible with this diagnosis, though suspect starvation ketosis is more of a driver of findings. That said, she had ketones and glucose in urine, elevated anion gap with acidosis and hyperglycemia. Hyperglycemia does not seem significant enough to effect her metabolically by itself and may be a stress response.  ?- CBGs are at inpatient goal on SSI which we will continue. Anion gap has closed.  ?  ?New diagnosis T2DM: Based on HbA1c 7.2%, checked due to hyperglycemia and glucosuria.  ?- Initiate SSI ?- Diabetes coordinator consult ?- Dietitian consult ? ?LFT elevation: Improving, hepatic steatosis without evidence of biliary etiology on CT, abd exam is benign.  ?- Monitor   ?  ?HTN:  ?- Continue IV tx, restart home med once able to tolerate po ? ?Sinus tachycardia: Due to dehydration and not being on beta blocker ?- Still appears dry, give LR  bolus, continue 125cc/hr and monitor ?  ?Leukocytosis: Persistent and rising despite IVF. Suspect reactive, remains afebrile. ?- Continue monitoring with supportive Tx.  ?  ?Chronic pain syndrome: Under pain management's care.  ?- Continue reported dosing of percocet to avoid withdrawal.  ? ?Hypothyroidism ?- Continue pt's home medication. Last TSH was 3. ?  ?Anxiety/depression ?- Continue SSRI, abilify ? ?HLD:  ?- Hold statin w/steatosis and LFT elevations for now. No contraindication going forward, however. ?  ?Chronic iron deficiency anemia: Hgb wnl (was hemoconcentrated at admission) ? ?Obesity: Estimated body mass index is 35.94 kg/m? as calculated from the following: ?  Height as of 11/11/20: '5\' 4"'$  (1.626 m). ?  Weight as of 11/11/20: 95 kg. ?- Given her new diagnosis of T2DM, would consider ozempic, defer to PCP ? ?Subjective: Has not tried to eat anything yet (examined in hallway of ED awaiting room) because she's thrown up 4-5 times this morning. This remains associated with severe nausea though is improved with IV antiemetics. Abdominal pain unchanged. No new complaints.  ? ?Objective: ?Vitals:  ? 08/02/21 8119 08/02/21 1036 08/02/21 1443 08/02/21 1458  ?BP: (!) 155/100 (!) 136/95  (!) 153/104  ?Pulse: 95 (!) 110  (!) 105  ?Resp: 18 (!) 22  18  ?Temp: 98.3 ?F (36.8 ?C)  98.1 ?F (36.7 ?C) 97.9 ?F (36.6 ?C)  ?TempSrc: Oral  Oral Oral  ?SpO2: 97% 94%  92%  ? ?Gen: 51 y.o. female in no distress appearing tired with dry mucous membranes ?Pulm: Nonlabored breathing room air. Clear ?CV: Regular rate and rhythm, ~100bpm without murmur, rub, or gallop. No JVD, no pitting dependent edema. ?GI:  Abdomen soft, diffusely tender without rebound or guarding. Non-distended, with normoactive bowel sounds.  ?Ext: Warm, no deformities ?Skin: No rashes, lesions or ulcers on visualized skin. ?Neuro: Alert and oriented. No focal neurological deficits. ?Psych: Judgement and insight appear fair. Mood euthymic & affect congruent.  Behavior is appropriate.   ? ?Data Personally reviewed: ?WBC 12.4k > 15.3k ?Hgb 16.8 > 13g/dl ?K 3.2 ?Glucose 220 > 236 (HbA1c 7.2%) ?Bicarbonate 18, anion gap 16. ?Cr 0.97 > 0.88 ?AST 91 > 44 ?ALT 61 > 42 ?TBili 1.6 > 0.8 ?Albumin 3.4 ?Lipase wnl at 32 ?UA: Ketonuria, glucosuria, +protein, no hgb/leuk esterase.  ?   ?CT ABDOMEN PELVIS W CONTRAST 08/01/2021 ?1. No acute findings are noted in the abdomen or pelvis to account for the patient's symptoms. 2. Severe hepatic steatosis. 3. Aortic atherosclerosis.  ? ?Family Communication: None at bedside ? ?Disposition: ?Status is: Inpatient ?Remains inpatient appropriate because: Requires IV therapies, fluids ?Planned Discharge Destination: Home ? ? ? ? ? ?Patrecia Pour, MD ?08/02/2021 3:57 PM ?Page by [email or web address]  ?

## 2021-08-02 NOTE — Assessment & Plan Note (Deleted)
Still appears dry, give LR bolus, continue 125cc/hr and monitor ?

## 2021-08-02 NOTE — Assessment & Plan Note (Deleted)
Based on HbA1c 7.2%, checked due to hyperglycemia and glucosuria.  ?- Initiate SSI ?- Diabetes coordinator consult ?- Dietitian consult ?

## 2021-08-02 NOTE — ED Notes (Signed)
Pt ambulated to restroom unassisted. Provided with specimen cup for UDS. Pt reports she "missed." Will attempt to recollect.  ?

## 2021-08-02 NOTE — Assessment & Plan Note (Signed)
Dietitian consult once able to take po ?

## 2021-08-03 LAB — COMPREHENSIVE METABOLIC PANEL
ALT: 45 U/L — ABNORMAL HIGH (ref 0–44)
AST: 58 U/L — ABNORMAL HIGH (ref 15–41)
Albumin: 3.2 g/dL — ABNORMAL LOW (ref 3.5–5.0)
Alkaline Phosphatase: 69 U/L (ref 38–126)
Anion gap: 5 (ref 5–15)
BUN: 6 mg/dL (ref 6–20)
CO2: 23 mmol/L (ref 22–32)
Calcium: 8.5 mg/dL — ABNORMAL LOW (ref 8.9–10.3)
Chloride: 115 mmol/L — ABNORMAL HIGH (ref 98–111)
Creatinine, Ser: 0.73 mg/dL (ref 0.44–1.00)
GFR, Estimated: >60 mL/min (ref >60)
Glucose, Bld: 142 mg/dL — ABNORMAL HIGH (ref 70–99)
Potassium: 3.8 mmol/L (ref 3.5–5.1)
Sodium: 143 mmol/L (ref 135–145)
Total Bilirubin: 0.7 mg/dL (ref 0.3–1.2)
Total Protein: 6.3 g/dL — ABNORMAL LOW (ref 6.5–8.1)

## 2021-08-03 LAB — CBC WITH DIFFERENTIAL/PLATELET
Abs Immature Granulocytes: 0.15 10*3/uL — ABNORMAL HIGH (ref 0.00–0.07)
Basophils Absolute: 0.1 10*3/uL (ref 0.0–0.1)
Basophils Relative: 1 %
Eosinophils Absolute: 0.1 10*3/uL (ref 0.0–0.5)
Eosinophils Relative: 1 %
HCT: 39.8 % (ref 36.0–46.0)
Hemoglobin: 12.7 g/dL (ref 12.0–15.0)
Immature Granulocytes: 2 %
Lymphocytes Relative: 22 %
Lymphs Abs: 2 10*3/uL (ref 0.7–4.0)
MCH: 30.2 pg (ref 26.0–34.0)
MCHC: 31.9 g/dL (ref 30.0–36.0)
MCV: 94.5 fL (ref 80.0–100.0)
Monocytes Absolute: 0.6 10*3/uL (ref 0.1–1.0)
Monocytes Relative: 7 %
Neutro Abs: 6 10*3/uL (ref 1.7–7.7)
Neutrophils Relative %: 67 %
Platelets: 178 10*3/uL (ref 150–400)
RBC: 4.21 MIL/uL (ref 3.87–5.11)
RDW: 14.3 % (ref 11.5–15.5)
WBC: 8.9 10*3/uL (ref 4.0–10.5)
nRBC: 0 % (ref 0.0–0.2)

## 2021-08-03 LAB — MAGNESIUM: Magnesium: 1.8 mg/dL (ref 1.7–2.4)

## 2021-08-03 LAB — GLUCOSE, CAPILLARY
Glucose-Capillary: 137 mg/dL — ABNORMAL HIGH (ref 70–99)
Glucose-Capillary: 117 mg/dL — ABNORMAL HIGH (ref 70–99)

## 2021-08-03 MED ORDER — ONDANSETRON 4 MG PO TBDP: 4.0000 mg | ORAL_TABLET | Freq: Three times a day (TID) | ORAL | 0 refills | Status: DC | PRN

## 2021-08-03 MED ORDER — BLOOD GLUCOSE MONITOR KIT
PACK | 0 refills | Status: DC
Start: 2021-08-03 — End: 2022-03-03

## 2021-08-03 MED ORDER — LIVING WELL WITH DIABETES BOOK
Freq: Once | Status: AC
  Filled 2021-08-03: qty 1

## 2021-08-03 MED ORDER — PROMETHAZINE HCL 25 MG RE SUPP: 25.0000 mg | Freq: Every day | RECTAL | 0 refills | Status: DC | PRN

## 2021-08-03 NOTE — Progress Notes (Signed)
Inpatient Diabetes Program Recommendations ? ?AACE/ADA: New Consensus Statement on Inpatient Glycemic Control (2015) ? ?Target Ranges:  Prepandial:   less than 140 mg/dL ?     Peak postprandial:   less than 180 mg/dL (1-2 hours) ?     Critically ill patients:  140 - 180 mg/dL  ? ?Lab Results  ?Component Value Date  ? GLUCAP 117 (H) 08/03/2021  ? HGBA1C 7.2 (H) 08/01/2021  ? ? ?Review of Glycemic Control ? Latest Reference Range & Units 08/02/21 07:24 08/02/21 12:45 08/02/21 15:03 08/03/21 07:50 08/03/21 11:29  ?Glucose-Capillary 70 - 99 mg/dL 224 (H) 155 (H) 122 (H) 137 (H) 117 (H)  ? ?Diabetes history: New diagnosis of DM ?Outpatient Diabetes medications: None ?Current orders for Inpatient glycemic control:  ?Novolog moderate tid with meals ? ?Inpatient Diabetes Program Recommendations:   ? ?Spoke with pt about new diagnosis.  Discussed A1C results with her and explained what an A1C is, basic pathophysiology of DM Type 2, basic home care, importance of checking CBGs and maintaining good CBG control to prevent long-term and short-term complications.  Ordered LWWD booklet for patient and discussed basic information regarding diet including the plate method and the elimination of sugar from beverages.  Explained that no meds would be started today, but that MD wants her to f/u with PCP.  She admits to weight gain since last hospitalization.  Explained importance of close f/u with PCP.  Patient appreciative of visit.  ? ?Thanks,  ?Adah Perl, RN, BC-ADM ?Inpatient Diabetes Coordinator ?Pager [phone]  (8a-5p) ? ? ?

## 2021-08-03 NOTE — TOC Transition Note (Signed)
Transition of Care (TOC) - CM/SW Discharge Note ? ? ?Patient Details  ?Name: [name] ?MRN: [id number] ?Date of Birth: 08-21-1970 ? ?Transition of Care (TOC) CM/SW Contact:  ?Tom-Johnson, Renea Ee, RN ?Phone Number: ?08/03/2021, 4:10 PM ? ? ?Clinical Narrative:    ? ?Patient is scheduled for discharge today. No TOC recommendations noted and denies any needs. No further TOC needs noted.  ? ?  ?  ? ? ?Patient Goals and CMS Choice ?  ?  ?  ? ?Discharge Placement ?  ?           ?  ?  ?  ?  ? ?Discharge Plan and Services ?  ?  ?           ?  ?  ?  ?  ?  ?  ?  ?  ?  ?  ? ?Social Determinants of Health (SDOH) Interventions ?  ? ? ?Readmission Risk Interventions ? ?  05/12/2020  ? 12:39 PM  ?Readmission Risk Prevention Plan  ?Transportation Screening Complete  ?PCP or Specialist Appt within 3-5 Days Complete  ?[place] or Home Care Consult Complete  ?Social Work Consult for [place] Planning/Counseling Complete  ?Palliative Care Screening Not Applicable  ?Medication Review Press photographer) Complete  ? ? ? ? ? ?

## 2021-08-03 NOTE — Discharge Summary (Signed)
Physician Discharge Summary  ?[name] OVF:[id number] DOB: Mar 12, 1971 DOA: 08/01/2021 ? ?PCP: Andreas Blower, MD ? ?Admit date: 08/01/2021 ?Discharge date: 08/03/2021 ?Recommendations for Outpatient Follow-up:  ?Follow up with PCP in 1 weeks-call for appointment ?Please obtain BMP/CBC in one week ? ?Discharge Dispo: home ?Discharge Condition: Stable ?Code Status:   Code Status: Full Code ?Diet recommendation:  ?Diet Order   ? ?       ?  DIET SOFT Room service appropriate? Yes; Fluid consistency: Thin  Diet effective now       ?  ?  Diet - low sodium heart healthy       ?  ? ?  ?  ? ?  ?  ? ?Brief/Interim Summary: ? 51 y.o. female with a history of HTN, GERD, Barrett's esophagus, anxiety/depression, chronic pain, HLD, iron deficiency anemia, and cardiac arrest Feb 2022 who presented to the ED 4/11 with intractable nausea, vomiting and abdominal pain. She was tachycardic with hypertension, but afebrile with WBC elevated. LFTs were modestly elevated (AST 90, ALT 60). CT abd/pelvis revealed severe hepatic steatosis without acute findings to explain symptoms. She was placed on IVF, given IV antiemetics and admitted.  Also found to have new diagnosis of diabetes with mild DKA with A1c 7.2, also in sinus tachycardia leukocytosis. ?Patient being managed with liquid diet advance slowly, DM coordinator consulted for new onset diabetes ?Hemodynamically stable, leukocytosis resolved, overall electrolytes stable with blood sugar in 120s to 150s, mildly elevated LFTs likely from hepatic steatosis. ?At this time patient is tolerating diet no more vomiting or diarrhea.  Hemodynamically stable blood work is stable.  If she tolerates diet she will be discharged later today ? ?Discharge Diagnoses:  ?Acute gastroenteritis ?Intractable vomiting ?Abdominal pain: ?Likely from gastroenteritis, at this time resolved tolerating diet.  CT abdomen with severe hepatic steatosis, no other acute finding.  Continue diet as tolerated  continue Zofran supportive care at home follow-up with PCP ? ?Hypothyroidism continue home Synthroid ?Anxiety/depression continue SSRI Abilify ?Chronic iron deficiency stable hemoglobin follow-up with PCP ?Sinus tachycardia-resolved ?Hypokalemia-resolved ?Essential hypertension: Controlled continue home meds ?Hyperlipidemia-continue home meds ?Aortic atherosclerosis-PC follow-up ?LFT elevation-from severe hepatic steatosis most likely will need follow-up with PCP ? ??mild DKA-likely starvation ketoacidosis due to nausea vomiting labs improved ?T2DM: Blood sugar is stable.  A1c 7.2, seen by DM coordinator new onset diabetes secondary to follow-up PCP to initiate metformin will hold off on starting given her GI upset.She will be seen by diabetes coordinator prior to discharge today. ?Recent Labs  ?Lab 08/01/21 ?2241 08/02/21 ?0724 08/02/21 ?1245 08/02/21 ?1503 08/03/21 ?0750 08/03/21 ?1129  ?GLUCAP  --  224* 155* 122* 137* 117*  ?HGBA1C 7.2*  --   --   --   --   --   ? ?Consults: ?none ?Subjective: ?Alert awake oriented tolerating diet, some abdominal discomfort. ? ?Discharge Exam: ?Vitals:  ? 08/02/21 2011 08/03/21 1027  ?BP: (!) 156/93 (!) 146/91  ?Pulse: 98 96  ?Resp: 20 20  ?Temp: 98 ?F (36.7 ?C) 97.7 ?F (36.5 ?C)  ?SpO2: 93% 100%  ? ?General: Pt is alert, awake, not in acute distress ?Cardiovascular: RRR, S1/S2 +, no rubs, no gallops ?Respiratory: CTA bilaterally, no wheezing, no rhonchi ?Abdominal: Soft, NT, ND, bowel sounds + ?Extremities: no edema, no cyanosis ? ?Discharge Instructions ? ?Discharge Instructions   ? ? Diet - low sodium heart healthy   Complete by: As directed ?  ? Discharge instructions   Complete by: As directed ?  ?  PCP follow-up within 1 week with repeat labs CBC BMP ? ?Please call call MD or return to ER for similar or worsening recurring problem that brought you to hospital or if any fever,nausea/vomiting,abdominal pain, uncontrolled pain, chest pain,  shortness of breath or any other  alarming symptoms. ? ?Please follow-up your doctor as instructed in a week time and call the office for appointment. ? ?Please avoid alcohol, smoking, or any other illicit substance and maintain healthy habits including taking your regular medications as prescribed. ? ?You were cared for by a hospitalist during your hospital stay. If you have any questions about your discharge medications or the care you received while you were in the hospital after you are discharged, you can call the unit and ask to speak with the hospitalist on call if the hospitalist that took care of you is not available. ? ?Once you are discharged, your primary care physician will handle any further medical issues. Please note that NO REFILLS for any discharge medications will be authorized once you are discharged, as it is imperative that you return to your primary care physician (or establish a relationship with a primary care physician if you do not have one) for your aftercare needs so that they can reassess your need for medications and monitor your lab values  ? Increase activity slowly   Complete by: As directed ?  ? ?  ? ?Allergies as of 08/03/2021   ? ?   Reactions  ? Lactose Intolerance (gi) Nausea Only  ? ?  ? ?  ?Medication List  ?  ? ?STOP taking these medications   ? ?ondansetron 4 MG tablet ?Commonly known as: ZOFRAN ?  ? ?  ? ?TAKE these medications   ? ?acetaminophen 325 MG tablet ?Commonly known as: TYLENOL ?Take 2 tablets (650 mg total) by mouth every 6 (six) hours as needed for mild pain, fever or headache. ?What changed: when to take this ?  ?ARIPiprazole 5 MG tablet ?Commonly known as: ABILIFY ?Take 1 tablet (5 mg total) by mouth daily. ?  ?atorvastatin 10 MG tablet ?Commonly known as: LIPITOR ?Take 10 mg by mouth daily. ?  ?BENADRYL PO ?Take 3 tablets by mouth at bedtime as needed (sleep). ?  ?blood glucose meter kit and supplies Kit ?Dispense based on patient and insurance preference. Use up to four times daily as  directed. ?  ?carvedilol 6.25 MG tablet ?Commonly known as: COREG ?Take 1 tablet (6.25 mg total) by mouth 2 (two) times daily with a meal. ?  ?Euthyrox 25 MCG tablet ?Generic drug: levothyroxine ?Take 25 mcg by mouth every morning. ?  ?ferrous sulfate 325 (65 FE) MG tablet ?Take 1 tablet (325 mg total) by mouth daily with breakfast. ?  ?FLUoxetine 40 MG capsule ?Commonly known as: PROZAC ?Take 1 capsule (40 mg total) by mouth daily. ?  ?GAS-X PO ?Take 2 tablets by mouth daily as needed (gas). ?  ?Melatonin 300 MCG Tabs ?Take 5 tablets (1,500 mcg total) by mouth at bedtime. ?  ?metoCLOPramide 10 MG tablet ?Commonly known as: REGLAN ?Take 1 tablet (10 mg total) by mouth 3 (three) times daily before meals. ?  ?multivitamin Tabs tablet ?Take 1 tablet by mouth at bedtime. ?  ?omeprazole 20 MG capsule ?Commonly known as: PRILOSEC ?Take 1 capsule (20 mg total) by mouth daily. ?  ?ondansetron 4 MG disintegrating tablet ?Commonly known as: ZOFRAN-ODT ?Take 4 mg by mouth daily as needed for nausea or vomiting. ?  ?oxyCODONE-acetaminophen 7.5-325 MG tablet ?  Commonly known as: PERCOCET ?Take 1 tablet by mouth See admin instructions. Take 7.5 - 325 mg 5 times daily ?  ?prazosin 2 MG capsule ?Commonly known as: MINIPRESS ?Take 1 capsule (2 mg total) by mouth at bedtime. ?  ?promethazine 25 MG suppository ?Commonly known as: PHENERGAN ?Place 25 mg rectally daily as needed for nausea or vomiting. ?  ?promethazine 25 MG tablet ?Commonly known as: PHENERGAN ?Take 25 mg by mouth 2 (two) times daily as needed for nausea or vomiting. ?  ?tiZANidine 4 MG capsule ?Commonly known as: ZANAFLEX ?Take 4 mg by mouth in the morning, at noon, in the evening, and at bedtime. ?  ?traZODone 50 MG tablet ?Commonly known as: DESYREL ?Take 50 mg by mouth in the morning and at bedtime. ?  ? ?  ? ? Follow-up Information   ? ? Andreas Blower, MD Follow up in 1 week(s).   ?Specialty: Family Medicine ?Why: Follow-up in a week to start treatment for  diabetes ?Contact information: ?[address] ?[place] [address] ?[phone] ? ? ?  ?  ? ?  ?  ? ?  ? ?Allergies  ?Allergen Reactions  ? Lactose Intolerance (Gi) Nausea Only  ? ? ?The results of significant diagn

## 2021-08-03 NOTE — Progress Notes (Signed)
[name] to be discharged Home per MD order. Discussed prescriptions and follow up appointments with the patient. Prescriptions and medication list explained in detail. Patient verbalized understanding. ? ?Living well with Diabetes book given to the patient and emphasized on reading it.   ? ?Skin clean, dry and intact without evidence of skin break down, no evidence of skin tears noted. IV catheter discontinued intact. Site without signs and symptoms of complications. Dressing and pressure applied. Pt denies pain at the site currently. No complaints noted. ? ?Patient free of lines, drains, and wounds.  ? ?An After Visit Summary (AVS) was printed and given to the patient. ?Patient escorted via wheelchair, and discharged home via private auto. ? ?Amaryllis Dyke, RN  ?

## 2021-08-03 NOTE — Hospital Course (Addendum)
51 y.o. female with a history of HTN, GERD, Barrett's esophagus, anxiety/depression, chronic pain, HLD, iron deficiency anemia, and cardiac arrest Feb 2022 who presented to the ED 4/11 with intractable nausea, vomiting and abdominal pain. She was tachycardic with hypertension, but afebrile with WBC elevated. LFTs were modestly elevated (AST 90, ALT 60). CT abd/pelvis revealed severe hepatic steatosis without acute findings to explain symptoms. She was placed on IVF, given IV antiemetics and admitted.  Also found to have new diagnosis of diabetes with mild DKA with A1c 7.2, also in sinus tachycardia leukocytosis. ?Patient being managed with liquid diet advance slowly, DM coordinator consulted for new onset diabetes ?Hemodynamically stable, leukocytosis resolved, overall electrolytes stable with blood sugar in 120s to 150s, mildly elevated LFTs likely from hepatic steatosis. ?At this time patient is tolerating diet no more vomiting or diarrhea.  Hemodynamically stable blood work is stable.  If she tolerates diet she will be discharged later today ? ? ?

## 2021-08-09 ENCOUNTER — Ambulatory Visit (INDEPENDENT_AMBULATORY_CARE_PROVIDER_SITE_OTHER): Payer: Medicare Other | Admitting: Cardiovascular Disease

## 2021-08-09 ENCOUNTER — Encounter: Payer: Self-pay | Admitting: Cardiovascular Disease

## 2021-08-09 DIAGNOSIS — I10 Essential (primary) hypertension: Secondary | ICD-10-CM | POA: Diagnosis not present

## 2021-08-09 DIAGNOSIS — I469 Cardiac arrest, cause unspecified: Secondary | ICD-10-CM | POA: Diagnosis not present

## 2021-08-09 NOTE — Assessment & Plan Note (Signed)
History of essential hypertension blood pressure measured today at 114/80.  She is on carvedilol. ?

## 2021-08-09 NOTE — Assessment & Plan Note (Signed)
History of hyperlipidemia on low-dose statin therapy.  We will recheck a lipid liver profile today. ?

## 2021-08-09 NOTE — Progress Notes (Signed)
? ? ? ?08/09/2021 ?[name]   ?October 15, 1970  ?[id number] ? ?Primary Physician Ferd Hibbs, NP ?Primary Cardiologist: Lorretta Harp MD Lupe Carney, [place] ? ?HPI:  [name] is a 51 y.o.  severely overweight married Caucasian female mother of 2 children who currently does not work and is being seen for post hospital follow-up at the request of her primary care provider, Dr. Claris Gower.  I last saw her in the office 11/11/2020.  Risk factors include treated hypertension and hyperlipidemia.  She does not smoke.  There is no family history of heart disease.  She is never had a heart attack or stroke.  She denies chest pain or shortness of breath.  She did have COVID back in January.  She had a witnessed cardiac arrest 07/06/2020 with ROSC in 3 minutes.  I was asked to see her in the emergency room and at that time I did not think her EKG met STEMI criteria.  Bedside echo was performed by Dr. Audie Box  revealing normal LV systolic function without regional wall motion abnormalities.  She was hospitalized from 06/08/2020 to 06/23/2020.  She did have sepsis and her UDS was positive for opiates although she denies their use.  Her 2D echo during her hospitalization was entirely normal.  Since discharge she has had no symptoms of chest pain or shortness of breath. ? ?Since I saw her a year ago she is done well.  She denies chest pain or shortness of breath.  She was recently seen in the ER for abdominal pain of unclear etiology.  She does use opiates for pain control. ? ?Current Meds  ?Medication Sig  ? acetaminophen (TYLENOL) 325 MG tablet Take 2 tablets (650 mg total) by mouth every 6 (six) hours as needed for mild pain, fever or headache. (Patient taking differently: Take 650 mg by mouth 2 (two) times daily as needed for mild pain, fever or headache.)  ? ARIPiprazole (ABILIFY) 5 MG tablet Take 1 tablet (5 mg total) by mouth daily.  ? atorvastatin (LIPITOR) 10 MG tablet Take 10 mg by mouth daily.  ? blood  glucose meter kit and supplies KIT Dispense based on patient and insurance preference. Use up to four times daily as directed.  ? carvedilol (COREG) 6.25 MG tablet Take 1 tablet (6.25 mg total) by mouth 2 (two) times daily with a meal.  ? diphenhydrAMINE HCl (BENADRYL PO) Take 3 tablets by mouth at bedtime as needed (sleep).  ? EUTHYROX 25 MCG tablet Take 25 mcg by mouth every morning.  ? ferrous sulfate 325 (65 FE) MG tablet Take 1 tablet (325 mg total) by mouth daily with breakfast.  ? FLUoxetine (PROZAC) 40 MG capsule Take 1 capsule (40 mg total) by mouth daily.  ? melatonin 300 MCG TABS Take 5 tablets (1,500 mcg total) by mouth at bedtime.  ? metoCLOPramide (REGLAN) 10 MG tablet Take 1 tablet (10 mg total) by mouth 3 (three) times daily before meals.  ? multivitamin (RENA-VIT) TABS tablet Take 1 tablet by mouth at bedtime.  ? omeprazole (PRILOSEC) 20 MG capsule Take 1 capsule (20 mg total) by mouth daily.  ? ondansetron (ZOFRAN-ODT) 4 MG disintegrating tablet Take 1 tablet (4 mg total) by mouth every 8 (eight) hours as needed for nausea or vomiting.  ? oxyCODONE-acetaminophen (PERCOCET) 7.5-325 MG tablet Take 1 tablet by mouth See admin instructions. Take 7.5 - 325 mg 5 times daily  ? prazosin (MINIPRESS) 2 MG capsule Take 1 capsule (2 mg  total) by mouth at bedtime.  ? promethazine (PHENERGAN) 25 MG suppository Place 1 suppository (25 mg total) rectally daily as needed for up to 30 doses for nausea or vomiting.  ? promethazine (PHENERGAN) 25 MG tablet Take 25 mg by mouth 2 (two) times daily as needed for nausea or vomiting.  ? Simethicone (GAS-X PO) Take 2 tablets by mouth daily as needed (gas).  ? tiZANidine (ZANAFLEX) 4 MG capsule Take 4 mg by mouth in the morning, at noon, in the evening, and at bedtime.  ? traZODone (DESYREL) 50 MG tablet Take 50 mg by mouth in the morning and at bedtime.  ?  ? ?Allergies  ?Allergen Reactions  ? Lactose Intolerance (Gi) Nausea Only  ? ? ?Social History  ? ?Socioeconomic  History  ? Marital status: Married  ?  Spouse name: Not on file  ? Number of children: Not on file  ? Years of education: Not on file  ? Highest education level: Not on file  ?Occupational History  ? Not on file  ?Tobacco Use  ? Smoking status: Never  ? Smokeless tobacco: Never  ?Vaping Use  ? Vaping Use: Never used  ?Substance and Sexual Activity  ? Alcohol use: Not Currently  ?  Comment: 10/15/2014 "I've drank before; nothing regular; don't drink now cause of RX I'm on"  ? Drug use: No  ? Sexual activity: Not Currently  ?Other Topics Concern  ? Not on file  ?Social History Narrative  ? [place]. Never smoked; quit alcohol 10 years; used to work for [employer]. NO IV drug abuse.   ? ?Social Determinants of Health  ? ?Financial Resource Strain: Not on file  ?Food Insecurity: Not on file  ?Transportation Needs: Not on file  ?Physical Activity: Not on file  ?Stress: Not on file  ?Social Connections: Not on file  ?Intimate Partner Violence: Not on file  ?  ? ?Review of Systems: ?General: negative for chills, fever, night sweats or weight changes.  ?Cardiovascular: negative for chest pain, dyspnea on exertion, edema, orthopnea, palpitations, paroxysmal nocturnal dyspnea or shortness of breath ?Dermatological: negative for rash ?Respiratory: negative for cough or wheezing ?Urologic: negative for hematuria ?Abdominal: negative for nausea, vomiting, diarrhea, bright red blood per rectum, melena, or hematemesis ?Neurologic: negative for visual changes, syncope, or dizziness ?All other systems reviewed and are otherwise negative except as noted above. ? ? ? ?Blood pressure 114/80, pulse 67, height _0  (1.626 m), weight 237 lb (107.5 kg), last menstrual period 06/03/2018.  ?General appearance: alert and no distress ?Neck: no adenopathy, no carotid bruit, no JVD, supple, symmetrical, trachea midline, and thyroid not enlarged, symmetric, no tenderness/mass/nodules ?Lungs: clear  to auscultation bilaterally ?Heart: regular rate and rhythm, S1, S2 normal, no murmur, click, rub or gallop ?Extremities: extremities normal, atraumatic, no cyanosis or edema ?Pulses: 2+ and symmetric ?Skin: Skin color, texture, turgor normal. No rashes or lesions ?Neurologic: Grossly normal ? ?EKG sinus rhythm at 67 without ST or T wave changes.  I personally reviewed this EKG. ? ?ASSESSMENT AND PLAN:  ? ?Cardiac arrest [facility]) ?History of cardiac arrest 07/06/2020 with ROSC in 3 minutes.  Bedside echo revealed normal LV function.  She had no recurrent symptoms.  Her UDS was positive and she is on opiates for chronic pain. ? ?Hypertension ?History of essential hypertension blood pressure measured today at 114/80.  She is on carvedilol. ? ?Hyperlipidemia ?History of hyperlipidemia on low-dose statin therapy.  We will recheck a  lipid liver profile today. ? ? ? ? ?Lorretta Harp MD FACP,FACC,FAHA, FSCAI ?08/09/2021 ?12:07 PM ?

## 2021-08-09 NOTE — Assessment & Plan Note (Signed)
History of cardiac arrest 07/06/2020 with ROSC in 3 minutes.  Bedside echo revealed normal LV function.  She had no recurrent symptoms.  Her UDS was positive and she is on opiates for chronic pain. ?

## 2021-08-09 NOTE — Patient Instructions (Signed)
Medication Instructions:  °Your physician recommends that you continue on your current medications as directed. Please refer to the Current Medication list given to you today. ° °*If you need a refill on your cardiac medications before your next appointment, please call your pharmacy* ° ° °Lab Work: °Your physician recommends that you have labs drawn today: Lipid/liver profile ° °If you have labs (blood work) drawn today and your tests are completely normal, you will receive your results only by: °MyChart Message (if you have MyChart) OR °A paper copy in the mail °If you have any lab test that is abnormal or we need to change your treatment, we will call you to review the results. ° ° ° °Follow-Up: °At [facility], you and your health needs are our priority.  As part of our continuing mission to provide you with exceptional heart care, we have created designated Provider Care Teams.  These Care Teams include your primary Cardiologist (physician) and Advanced Practice Providers (APPs -  Physician Assistants and Nurse Practitioners) who all work together to provide you with the care you need, when you need it. ° °We recommend signing up for the patient portal called "MyChart".  Sign up information is provided on this After Visit Summary.  MyChart is used to connect with patients for Virtual Visits (Telemedicine).  Patients are able to view lab/test results, encounter notes, upcoming appointments, etc.  Non-urgent messages can be sent to your provider as well.   °To learn more about what you can do with MyChart, go to [email or web address].   ° °Your next appointment:   °12 month(s) ° °The format for your next appointment:   °In Person ° °Provider:   °Jonathan Berry, MD °

## 2021-08-26 ENCOUNTER — Other Ambulatory Visit: Payer: Self-pay

## 2021-08-26 ENCOUNTER — Encounter (HOSPITAL_COMMUNITY): Payer: Self-pay | Admitting: Emergency Medicine

## 2021-08-26 ENCOUNTER — Inpatient Hospital Stay (HOSPITAL_COMMUNITY)
Admission: EM | Admit: 2021-08-26 | Discharge: 2021-08-29 | DRG: 392 | Disposition: A | Discharge status: Home / Self Care | Payer: Medicare Other | Attending: Family Medicine | Admitting: Family Medicine

## 2021-08-26 DIAGNOSIS — E669 Obesity, unspecified: Secondary | ICD-10-CM | POA: Diagnosis present

## 2021-08-26 DIAGNOSIS — R112 Nausea with vomiting, unspecified: Principal | ICD-10-CM

## 2021-08-26 DIAGNOSIS — Z20822 Contact with and (suspected) exposure to covid-19: Secondary | ICD-10-CM | POA: Diagnosis present

## 2021-08-26 DIAGNOSIS — Z79899 Other long term (current) drug therapy: Secondary | ICD-10-CM

## 2021-08-26 DIAGNOSIS — E875 Hyperkalemia: Secondary | ICD-10-CM | POA: Diagnosis present

## 2021-08-26 DIAGNOSIS — K760 Fatty (change of) liver, not elsewhere classified: Secondary | ICD-10-CM | POA: Diagnosis present

## 2021-08-26 DIAGNOSIS — E8729 Other acidosis: Secondary | ICD-10-CM | POA: Diagnosis present

## 2021-08-26 DIAGNOSIS — Z833 Family history of diabetes mellitus: Secondary | ICD-10-CM

## 2021-08-26 DIAGNOSIS — N179 Acute kidney failure, unspecified: Secondary | ICD-10-CM | POA: Diagnosis present

## 2021-08-26 DIAGNOSIS — D75839 Thrombocytosis, unspecified: Secondary | ICD-10-CM | POA: Diagnosis present

## 2021-08-26 DIAGNOSIS — E739 Lactose intolerance, unspecified: Secondary | ICD-10-CM | POA: Diagnosis present

## 2021-08-26 DIAGNOSIS — I10 Essential (primary) hypertension: Secondary | ICD-10-CM | POA: Diagnosis present

## 2021-08-26 DIAGNOSIS — R1110 Vomiting, unspecified: Secondary | ICD-10-CM | POA: Diagnosis not present

## 2021-08-26 DIAGNOSIS — E039 Hypothyroidism, unspecified: Secondary | ICD-10-CM | POA: Diagnosis present

## 2021-08-26 DIAGNOSIS — A084 Viral intestinal infection, unspecified: Secondary | ICD-10-CM | POA: Diagnosis not present

## 2021-08-26 DIAGNOSIS — D751 Secondary polycythemia: Secondary | ICD-10-CM | POA: Diagnosis present

## 2021-08-26 DIAGNOSIS — F418 Other specified anxiety disorders: Secondary | ICD-10-CM | POA: Diagnosis present

## 2021-08-26 DIAGNOSIS — E119 Type 2 diabetes mellitus without complications: Secondary | ICD-10-CM | POA: Diagnosis present

## 2021-08-26 DIAGNOSIS — E785 Hyperlipidemia, unspecified: Secondary | ICD-10-CM | POA: Diagnosis present

## 2021-08-26 DIAGNOSIS — Z6839 Body mass index (BMI) 39.0-39.9, adult: Secondary | ICD-10-CM

## 2021-08-26 DIAGNOSIS — F4310 Post-traumatic stress disorder, unspecified: Secondary | ICD-10-CM | POA: Diagnosis present

## 2021-08-26 DIAGNOSIS — F32A Depression, unspecified: Secondary | ICD-10-CM | POA: Diagnosis present

## 2021-08-26 DIAGNOSIS — M545 Low back pain, unspecified: Secondary | ICD-10-CM | POA: Diagnosis present

## 2021-08-26 DIAGNOSIS — G8929 Other chronic pain: Secondary | ICD-10-CM | POA: Diagnosis present

## 2021-08-26 DIAGNOSIS — T730XXA Starvation, initial encounter: Secondary | ICD-10-CM | POA: Diagnosis present

## 2021-08-26 LAB — COMPREHENSIVE METABOLIC PANEL
ALT: 41 U/L (ref 0–44)
AST: 66 U/L — ABNORMAL HIGH (ref 15–41)
Albumin: 4.1 g/dL (ref 3.5–5.0)
Alkaline Phosphatase: 104 U/L (ref 38–126)
Anion gap: 20 — ABNORMAL HIGH (ref 5–15)
BUN: 9 mg/dL (ref 6–20)
CO2: 18 mmol/L — ABNORMAL LOW (ref 22–32)
Calcium: 10.1 mg/dL (ref 8.9–10.3)
Chloride: 99 mmol/L (ref 98–111)
Creatinine, Ser: 1.2 mg/dL — ABNORMAL HIGH (ref 0.44–1.00)
GFR, Estimated: 55 mL/min — ABNORMAL LOW (ref >60)
Glucose, Bld: 214 mg/dL — ABNORMAL HIGH (ref 70–99)
Potassium: 4.2 mmol/L (ref 3.5–5.1)
Sodium: 137 mmol/L (ref 135–145)
Total Bilirubin: 0.8 mg/dL (ref 0.3–1.2)
Total Protein: 9 g/dL — ABNORMAL HIGH (ref 6.5–8.1)

## 2021-08-26 LAB — CBC
HCT: 54.8 % — ABNORMAL HIGH (ref 36.0–46.0)
Hemoglobin: 17.4 g/dL — ABNORMAL HIGH (ref 12.0–15.0)
MCH: 29.9 pg (ref 26.0–34.0)
MCHC: 31.8 g/dL (ref 30.0–36.0)
MCV: 94.3 fL (ref 80.0–100.0)
Platelets: 524 10*3/uL — ABNORMAL HIGH (ref 150–400)
RBC: 5.81 MIL/uL — ABNORMAL HIGH (ref 3.87–5.11)
RDW: 13.2 % (ref 11.5–15.5)
WBC: 17 10*3/uL — ABNORMAL HIGH (ref 4.0–10.5)
nRBC: 0 % (ref 0.0–0.2)

## 2021-08-26 LAB — LIPASE, BLOOD: Lipase: 37 U/L (ref 11–51)

## 2021-08-26 LAB — RESP PANEL BY RT-PCR (FLU A&B, COVID) ARPGX2
Influenza A by PCR: NEGATIVE
Influenza B by PCR: NEGATIVE
SARS Coronavirus 2 by RT PCR: NEGATIVE

## 2021-08-26 LAB — URINALYSIS, ROUTINE W REFLEX MICROSCOPIC
Bacteria, UA: NONE SEEN
Bilirubin Urine: NEGATIVE
Glucose, UA: NEGATIVE mg/dL
Hgb urine dipstick: NEGATIVE
Ketones, ur: 20 mg/dL — AB
Nitrite: NEGATIVE
Protein, ur: 100 mg/dL — AB
Specific Gravity, Urine: 1.023 (ref 1.005–1.030)
pH: 5 (ref 5.0–8.0)

## 2021-08-26 MED ORDER — SODIUM CHLORIDE 0.9 % IV BOLUS
1000.0000 mL | Freq: Once | INTRAVENOUS | Status: AC
  Administered 2021-08-27: 1000 mL via INTRAVENOUS

## 2021-08-26 MED ORDER — FENTANYL CITRATE PF 50 MCG/ML IJ SOSY
50.0000 ug | PREFILLED_SYRINGE | Freq: Once | INTRAMUSCULAR | Status: AC
  Administered 2021-08-27: 50 ug via INTRAVENOUS
  Filled 2021-08-26: qty 1

## 2021-08-26 MED ORDER — METOCLOPRAMIDE HCL 5 MG/ML IJ SOLN
10.0000 mg | INTRAMUSCULAR | Status: AC
  Administered 2021-08-27: 10 mg via INTRAVENOUS
  Filled 2021-08-26: qty 2

## 2021-08-26 NOTE — ED Provider Notes (Signed)
?[facility] ?Provider Note ? ? ?CSN: [id number] ?Arrival date & time: 08/26/21  2040 ? ?  ? ?History ? ?Chief Complaint  ?Patient presents with  ? Emesis  ? ? ?[name] is a 51 y.o. female. ? ?The history is provided by the patient and medical records.  ?Emesis ?Associated symptoms: abdominal pain and diarrhea   ? ?51 y.o. F with hx of HLP, HTN, HSV, DM2, PTSD, depression, headaches, presenting to the ED for N/V/D and abdominal pain since 9am this morning.  Pain localized around navel mostly, somewhat in right lower abdomen as well.  Denies fever/chills/sweats.  States she has vomited so much today now only has bile coming up.  Denies prior abdominal surgeries.  No meds PTA, could not hold anything down. ? ?Home Medications ?Prior to Admission medications   ?Medication Sig Start Date End Date Taking? Authorizing Provider  ?acetaminophen (TYLENOL) 325 MG tablet Take 2 tablets (650 mg total) by mouth every 6 (six) hours as needed for mild pain, fever or headache. ?Patient taking differently: Take 650 mg by mouth 2 (two) times daily as needed for mild pain, fever or headache. 07/01/20   Angiulli, Lavon Paganini, PA-C  ?ARIPiprazole (ABILIFY) 5 MG tablet Take 1 tablet (5 mg total) by mouth daily. 08/01/20   Jamse Arn, MD  ?atorvastatin (LIPITOR) 10 MG tablet Take 10 mg by mouth daily. 08/26/20   [provider]  ?blood glucose meter kit and supplies KIT Dispense based on patient and insurance preference. Use up to four times daily as directed. 08/03/21   Antonieta Pert, MD  ?carvedilol (COREG) 6.25 MG tablet Take 1 tablet (6.25 mg total) by mouth 2 (two) times daily with a meal. 08/01/20   Jamse Arn, MD  ?diphenhydrAMINE HCl (BENADRYL PO) Take 3 tablets by mouth at bedtime as needed (sleep).    [provider]  ?EUTHYROX 25 MCG tablet Take 25 mcg by mouth every morning. 08/26/20   [provider]  ?ferrous sulfate 325 (65 FE) MG tablet Take 1 tablet  (325 mg total) by mouth daily with breakfast. 07/01/20   Angiulli, Lavon Paganini, PA-C  ?FLUoxetine (PROZAC) 40 MG capsule Take 1 capsule (40 mg total) by mouth daily. 07/01/20   Angiulli, Lavon Paganini, PA-C  ?melatonin 300 MCG TABS Take 5 tablets (1,500 mcg total) by mouth at bedtime. 07/01/20   Angiulli, Lavon Paganini, PA-C  ?metoCLOPramide (REGLAN) 10 MG tablet Take 1 tablet (10 mg total) by mouth 3 (three) times daily before meals. 07/01/20   Angiulli, Lavon Paganini, PA-C  ?multivitamin (RENA-VIT) TABS tablet Take 1 tablet by mouth at bedtime. 08/01/20   Jamse Arn, MD  ?omeprazole (PRILOSEC) 20 MG capsule Take 1 capsule (20 mg total) by mouth daily. 08/01/20   Jamse Arn, MD  ?ondansetron (ZOFRAN-ODT) 4 MG disintegrating tablet Take 1 tablet (4 mg total) by mouth every 8 (eight) hours as needed for nausea or vomiting. 08/03/21 09/02/21  Antonieta Pert, MD  ?oxyCODONE-acetaminophen (PERCOCET) 7.5-325 MG tablet Take 1 tablet by mouth See admin instructions. Take 7.5 - 325 mg 5 times daily    [provider]  ?prazosin (MINIPRESS) 2 MG capsule Take 1 capsule (2 mg total) by mouth at bedtime. 08/01/20   Jamse Arn, MD  ?promethazine (PHENERGAN) 25 MG suppository Place 1 suppository (25 mg total) rectally daily as needed for up to 30 doses for nausea or vomiting. 08/03/21   Antonieta Pert, MD  ?promethazine (PHENERGAN) 25  MG tablet Take 25 mg by mouth 2 (two) times daily as needed for nausea or vomiting. 04/11/21   [provider]  ?Simethicone (GAS-X PO) Take 2 tablets by mouth daily as needed (gas).    [provider]  ?tiZANidine (ZANAFLEX) 4 MG capsule Take 4 mg by mouth in the morning, at noon, in the evening, and at bedtime.    [provider]  ?traZODone (DESYREL) 50 MG tablet Take 50 mg by mouth in the morning and at bedtime. 10/28/20   [provider]  ?   ? ?Allergies    ?Lactose intolerance (gi)   ? ?Review of Systems   ?Review of Systems  ?Gastrointestinal:  Positive for  abdominal pain, diarrhea, nausea and vomiting.  ?All other systems reviewed and are negative. ? ?Physical Exam ?Updated Vital Signs ?BP (!) 159/82   Pulse (!) 109   Temp 98.5 ?F (36.9 ?C) (Oral)   Resp 16   Ht 5' 4" (1.626 m)   Wt 104.3 kg   LMP 06/03/2018 (Approximate) Comment: neg hcg 05/10/20  SpO2 100%   BMI 39.48 kg/m?  ? ?Physical Exam ?Vitals and nursing note reviewed.  ?Constitutional:   ?   Appearance: She is well-developed.  ?   Comments: Vomiting up bile during exam  ?HENT:  ?   Head: Normocephalic and atraumatic.  ?Eyes:  ?   Conjunctiva/sclera: Conjunctivae normal.  ?   Pupils: Pupils are equal, round, and reactive to light.  ?Cardiovascular:  ?   Rate and Rhythm: Regular rhythm. Tachycardia present.  ?   Heart sounds: Normal heart sounds.  ?   Comments: Tachy 130's-140's on exam ?Pulmonary:  ?   Effort: Pulmonary effort is normal.  ?   Breath sounds: Normal breath sounds.  ?Abdominal:  ?   General: Bowel sounds are normal.  ?   Palpations: Abdomen is soft.  ?   Tenderness: There is abdominal tenderness in the right lower quadrant and periumbilical area.  ?   Comments: Periumbilical > RLQ tenderness  ?Musculoskeletal:     ?   General: Normal range of motion.  ?   Cervical back: Normal range of motion.  ?Skin: ?   General: Skin is warm and dry.  ?Neurological:  ?   Mental Status: She is alert and oriented to person, place, and time.  ? ? ?ED Results / Procedures / Treatments   ?Labs ?(all labs ordered are listed, but only abnormal results are displayed) ?Labs Reviewed  ?COMPREHENSIVE METABOLIC PANEL - Abnormal; Notable for the following components:  ?    Result Value  ? CO2 18 (*)   ? Glucose, Bld 214 (*)   ? Creatinine, Ser 1.20 (*)   ? Total Protein 9.0 (*)   ? AST 66 (*)   ? GFR, Estimated 55 (*)   ? Anion gap 20 (*)   ? All other components within normal limits  ?CBC - Abnormal; Notable for the following components:  ? WBC 17.0 (*)   ? RBC 5.81 (*)   ? Hemoglobin 17.4 (*)   ? HCT 54.8 (*)   ?  Platelets 524 (*)   ? All other components within normal limits  ?URINALYSIS, ROUTINE W REFLEX MICROSCOPIC - Abnormal; Notable for the following components:  ? Color, Urine AMBER (*)   ? APPearance HAZY (*)   ? Ketones, ur 20 (*)   ? Protein, ur 100 (*)   ? Leukocytes,Ua TRACE (*)   ? All other components within normal  limits  ?BETA-HYDROXYBUTYRIC ACID - Abnormal; Notable for the following components:  ? Beta-Hydroxybutyric Acid 2.24 (*)   ? All other components within normal limits  ?I-STAT VENOUS BLOOD GAS, ED - Abnormal; Notable for the following components:  ? pCO2, Ven 32.8 (*)   ? Bicarbonate 18.8 (*)   ? TCO2 20 (*)   ? Acid-base deficit 5.0 (*)   ? Calcium, Ion 1.09 (*)   ? HCT 54.0 (*)   ? Hemoglobin 18.4 (*)   ? All other components within normal limits  ?CBG MONITORING, ED - Abnormal; Notable for the following components:  ? Glucose-Capillary 203 (*)   ? All other components within normal limits  ?RESP PANEL BY RT-PCR (FLU A&B, COVID) [id number]  ?LIPASE, BLOOD  ? ? ?EKG ?None ? ?Radiology ?CT ABDOMEN PELVIS W CONTRAST ? ?Result Date: 08/27/2021 ?CLINICAL DATA:  Abdominal pain with nausea, vomiting and diarrhea. EXAM: CT ABDOMEN AND PELVIS WITH CONTRAST TECHNIQUE: Multidetector CT imaging of the abdomen and pelvis was performed using the standard protocol following bolus administration of intravenous contrast. RADIATION DOSE REDUCTION: This exam was performed according to the departmental dose-optimization program which includes automated exposure control, adjustment of the mA and/or kV according to patient size and/or use of iterative reconstruction technique. CONTRAST:  159m OMNIPAQUE IOHEXOL 350 MG/ML SOLN COMPARISON:  August 01, 2021 FINDINGS: Lower chest: No acute abnormality. Hepatobiliary: There is diffuse fatty infiltration of the liver parenchyma. No focal liver abnormality is seen. No gallstones, gallbladder wall thickening, or biliary dilatation. Pancreas: Unremarkable. No pancreatic ductal  dilatation or surrounding inflammatory changes. Spleen: Normal in size without focal abnormality. Adrenals/Urinary Tract: Adrenal glands are unremarkable. Kidneys are normal, without renal calculi, focal lesion, or hydronephr

## 2021-08-26 NOTE — ED Triage Notes (Signed)
Presents for N/V/D since 9am today with umbilical abd pain.  ?Emesis looked like food initially and now is yellow.  ? ?Endorses diaphoresis  ? ?Denies blood in emesis or diarrhea, fever, chills, urinary sx ? ?Has tried zofran and an unknown anti-nausea medication that she had at her house with no relief.  ? ?No h/o abd surgery.  ?

## 2021-08-27 ENCOUNTER — Emergency Department (HOSPITAL_COMMUNITY): Payer: Medicare Other

## 2021-08-27 ENCOUNTER — Encounter (HOSPITAL_COMMUNITY): Payer: Self-pay | Admitting: Family Medicine

## 2021-08-27 DIAGNOSIS — D751 Secondary polycythemia: Secondary | ICD-10-CM | POA: Diagnosis present

## 2021-08-27 DIAGNOSIS — E119 Type 2 diabetes mellitus without complications: Secondary | ICD-10-CM | POA: Diagnosis present

## 2021-08-27 DIAGNOSIS — I10 Essential (primary) hypertension: Secondary | ICD-10-CM

## 2021-08-27 DIAGNOSIS — E669 Obesity, unspecified: Secondary | ICD-10-CM | POA: Diagnosis present

## 2021-08-27 DIAGNOSIS — E8729 Other acidosis: Secondary | ICD-10-CM | POA: Diagnosis present

## 2021-08-27 DIAGNOSIS — N179 Acute kidney failure, unspecified: Secondary | ICD-10-CM | POA: Diagnosis present

## 2021-08-27 DIAGNOSIS — Z20822 Contact with and (suspected) exposure to covid-19: Secondary | ICD-10-CM | POA: Diagnosis present

## 2021-08-27 DIAGNOSIS — F418 Other specified anxiety disorders: Secondary | ICD-10-CM

## 2021-08-27 DIAGNOSIS — Z6839 Body mass index (BMI) 39.0-39.9, adult: Secondary | ICD-10-CM | POA: Diagnosis not present

## 2021-08-27 DIAGNOSIS — R109 Unspecified abdominal pain: Secondary | ICD-10-CM

## 2021-08-27 DIAGNOSIS — Z833 Family history of diabetes mellitus: Secondary | ICD-10-CM | POA: Diagnosis not present

## 2021-08-27 DIAGNOSIS — F32A Depression, unspecified: Secondary | ICD-10-CM | POA: Diagnosis present

## 2021-08-27 DIAGNOSIS — F4310 Post-traumatic stress disorder, unspecified: Secondary | ICD-10-CM | POA: Diagnosis present

## 2021-08-27 DIAGNOSIS — R1110 Vomiting, unspecified: Secondary | ICD-10-CM | POA: Diagnosis present

## 2021-08-27 DIAGNOSIS — Z79899 Other long term (current) drug therapy: Secondary | ICD-10-CM | POA: Diagnosis not present

## 2021-08-27 DIAGNOSIS — E039 Hypothyroidism, unspecified: Secondary | ICD-10-CM | POA: Diagnosis present

## 2021-08-27 DIAGNOSIS — T730XXA Starvation, initial encounter: Secondary | ICD-10-CM

## 2021-08-27 DIAGNOSIS — G8929 Other chronic pain: Secondary | ICD-10-CM | POA: Diagnosis present

## 2021-08-27 DIAGNOSIS — E739 Lactose intolerance, unspecified: Secondary | ICD-10-CM | POA: Diagnosis present

## 2021-08-27 DIAGNOSIS — A084 Viral intestinal infection, unspecified: Secondary | ICD-10-CM | POA: Diagnosis present

## 2021-08-27 DIAGNOSIS — E875 Hyperkalemia: Secondary | ICD-10-CM | POA: Diagnosis present

## 2021-08-27 DIAGNOSIS — M545 Low back pain, unspecified: Secondary | ICD-10-CM | POA: Diagnosis present

## 2021-08-27 DIAGNOSIS — E785 Hyperlipidemia, unspecified: Secondary | ICD-10-CM | POA: Diagnosis present

## 2021-08-27 DIAGNOSIS — D75839 Thrombocytosis, unspecified: Secondary | ICD-10-CM | POA: Diagnosis present

## 2021-08-27 DIAGNOSIS — K760 Fatty (change of) liver, not elsewhere classified: Secondary | ICD-10-CM | POA: Diagnosis present

## 2021-08-27 LAB — I-STAT VENOUS BLOOD GAS, ED
Acid-base deficit: 5 mmol/L — ABNORMAL HIGH (ref 0.0–2.0)
Bicarbonate: 18.8 mmol/L — ABNORMAL LOW (ref 20.0–28.0)
Calcium, Ion: 1.09 mmol/L — ABNORMAL LOW (ref 1.15–1.40)
HCT: 54 % — ABNORMAL HIGH (ref 36.0–46.0)
Hemoglobin: 18.4 g/dL — ABNORMAL HIGH (ref 12.0–15.0)
O2 Saturation: 75 %
Potassium: 3.9 mmol/L (ref 3.5–5.1)
Sodium: 140 mmol/L (ref 135–145)
TCO2: 20 mmol/L — ABNORMAL LOW (ref 22–32)
pCO2, Ven: 32.8 mmHg — ABNORMAL LOW (ref 44–60)
pH, Ven: 7.368 (ref 7.25–7.43)
pO2, Ven: 40 mmHg (ref 32–45)

## 2021-08-27 LAB — CBG MONITORING, ED
Glucose-Capillary: 203 mg/dL — ABNORMAL HIGH (ref 70–99)
Glucose-Capillary: 188 mg/dL — ABNORMAL HIGH (ref 70–99)

## 2021-08-27 LAB — COMPREHENSIVE METABOLIC PANEL
ALT: 36 U/L (ref 0–44)
AST: 46 U/L — ABNORMAL HIGH (ref 15–41)
Albumin: 3.6 g/dL (ref 3.5–5.0)
Alkaline Phosphatase: 85 U/L (ref 38–126)
Anion gap: 18 — ABNORMAL HIGH (ref 5–15)
BUN: 7 mg/dL (ref 6–20)
CO2: 16 mmol/L — ABNORMAL LOW (ref 22–32)
Calcium: 9.1 mg/dL (ref 8.9–10.3)
Chloride: 108 mmol/L (ref 98–111)
Creatinine, Ser: 1.01 mg/dL — ABNORMAL HIGH (ref 0.44–1.00)
GFR, Estimated: >60 mL/min (ref >60)
Glucose, Bld: 190 mg/dL — ABNORMAL HIGH (ref 70–99)
Potassium: 3.5 mmol/L (ref 3.5–5.1)
Sodium: 142 mmol/L (ref 135–145)
Total Bilirubin: 0.6 mg/dL (ref 0.3–1.2)
Total Protein: 7.7 g/dL (ref 6.5–8.1)

## 2021-08-27 LAB — GLUCOSE, CAPILLARY
Glucose-Capillary: 109 mg/dL — ABNORMAL HIGH (ref 70–99)
Glucose-Capillary: 147 mg/dL — ABNORMAL HIGH (ref 70–99)
Glucose-Capillary: 165 mg/dL — ABNORMAL HIGH (ref 70–99)
Glucose-Capillary: 167 mg/dL — ABNORMAL HIGH (ref 70–99)
Glucose-Capillary: 194 mg/dL — ABNORMAL HIGH (ref 70–99)

## 2021-08-27 LAB — CBC
HCT: 46.9 % — ABNORMAL HIGH (ref 36.0–46.0)
Hemoglobin: 15.4 g/dL — ABNORMAL HIGH (ref 12.0–15.0)
MCH: 30.5 pg (ref 26.0–34.0)
MCHC: 32.8 g/dL (ref 30.0–36.0)
MCV: 92.9 fL (ref 80.0–100.0)
Platelets: 346 10*3/uL (ref 150–400)
RBC: 5.05 MIL/uL (ref 3.87–5.11)
RDW: 13.6 % (ref 11.5–15.5)
WBC: 18.1 10*3/uL — ABNORMAL HIGH (ref 4.0–10.5)
nRBC: 0 % (ref 0.0–0.2)

## 2021-08-27 LAB — MRSA NEXT GEN BY PCR, NASAL: MRSA by PCR Next Gen: NOT DETECTED

## 2021-08-27 LAB — MAGNESIUM: Magnesium: 1.5 mg/dL — ABNORMAL LOW (ref 1.7–2.4)

## 2021-08-27 LAB — BETA-HYDROXYBUTYRIC ACID: Beta-Hydroxybutyric Acid: 2.24 mmol/L — ABNORMAL HIGH (ref 0.05–0.27)

## 2021-08-27 MED ORDER — SODIUM CHLORIDE 0.9 % IV SOLN: INTRAVENOUS | Status: AC

## 2021-08-27 MED ORDER — PANTOPRAZOLE SODIUM 40 MG PO TBEC
40.0000 mg | DELAYED_RELEASE_TABLET | Freq: Every day | ORAL | Status: DC
  Administered 2021-08-27 – 2021-08-29 (×3): 40 mg via ORAL
  Filled 2021-08-27 (×4): qty 1

## 2021-08-27 MED ORDER — IOHEXOL 350 MG/ML SOLN
100.0000 mL | Freq: Once | INTRAVENOUS | Status: AC | PRN
  Administered 2021-08-27: 100 mL via INTRAVENOUS

## 2021-08-27 MED ORDER — PRAZOSIN HCL 2 MG PO CAPS
2.0000 mg | ORAL_CAPSULE | Freq: Every day | ORAL | Status: DC
  Administered 2021-08-27 – 2021-08-28 (×2): 2 mg via ORAL
  Filled 2021-08-27 (×2): qty 1

## 2021-08-27 MED ORDER — PROCHLORPERAZINE EDISYLATE 10 MG/2ML IJ SOLN
10.0000 mg | Freq: Once | INTRAMUSCULAR | Status: AC
  Administered 2021-08-27: 10 mg via INTRAVENOUS
  Filled 2021-08-27: qty 2

## 2021-08-27 MED ORDER — TIZANIDINE HCL 4 MG PO TABS
4.0000 mg | ORAL_TABLET | Freq: Three times a day (TID) | ORAL | Status: DC
  Administered 2021-08-27 – 2021-08-29 (×7): 4 mg via ORAL
  Filled 2021-08-27 (×7): qty 1

## 2021-08-27 MED ORDER — LEVOTHYROXINE SODIUM 25 MCG PO TABS
25.0000 ug | ORAL_TABLET | Freq: Every morning | ORAL | Status: DC
  Administered 2021-08-27 – 2021-08-29 (×3): 25 ug via ORAL
  Filled 2021-08-27 (×3): qty 1

## 2021-08-27 MED ORDER — ATORVASTATIN CALCIUM 10 MG PO TABS
10.0000 mg | ORAL_TABLET | Freq: Every day | ORAL | Status: DC
  Administered 2021-08-27 – 2021-08-29 (×3): 10 mg via ORAL
  Filled 2021-08-27 (×3): qty 1

## 2021-08-27 MED ORDER — HYDRALAZINE HCL 20 MG/ML IJ SOLN
10.0000 mg | Freq: Four times a day (QID) | INTRAMUSCULAR | Status: DC | PRN
Start: 2021-08-27 — End: 2021-08-29

## 2021-08-27 MED ORDER — FENTANYL CITRATE PF 50 MCG/ML IJ SOSY
50.0000 ug | PREFILLED_SYRINGE | Freq: Once | INTRAMUSCULAR | Status: AC
  Administered 2021-08-27: 50 ug via INTRAVENOUS
  Filled 2021-08-27: qty 1

## 2021-08-27 MED ORDER — ACETAMINOPHEN 650 MG RE SUPP: 650.0000 mg | Freq: Four times a day (QID) | RECTAL | Status: DC | PRN

## 2021-08-27 MED ORDER — TRAZODONE HCL 50 MG PO TABS
50.0000 mg | ORAL_TABLET | Freq: Every day | ORAL | Status: DC
  Administered 2021-08-27 – 2021-08-28 (×2): 50 mg via ORAL
  Filled 2021-08-27 (×2): qty 1

## 2021-08-27 MED ORDER — CARVEDILOL 6.25 MG PO TABS
6.2500 mg | ORAL_TABLET | Freq: Two times a day (BID) | ORAL | Status: DC
  Administered 2021-08-27 – 2021-08-29 (×5): 6.25 mg via ORAL
  Filled 2021-08-27 (×5): qty 1

## 2021-08-27 MED ORDER — FLUOXETINE HCL 20 MG PO CAPS
40.0000 mg | ORAL_CAPSULE | Freq: Every day | ORAL | Status: DC
  Administered 2021-08-27 – 2021-08-29 (×3): 40 mg via ORAL
  Filled 2021-08-27 (×3): qty 2

## 2021-08-27 MED ORDER — ENOXAPARIN SODIUM 40 MG/0.4ML IJ SOSY
40.0000 mg | PREFILLED_SYRINGE | INTRAMUSCULAR | Status: DC
  Administered 2021-08-27 – 2021-08-29 (×3): 40 mg via SUBCUTANEOUS
  Filled 2021-08-27 (×3): qty 0.4

## 2021-08-27 MED ORDER — LORAZEPAM 2 MG/ML IJ SOLN
1.0000 mg | Freq: Once | INTRAMUSCULAR | Status: AC
  Administered 2021-08-27: 1 mg via INTRAVENOUS
  Filled 2021-08-27: qty 1

## 2021-08-27 MED ORDER — HYDROMORPHONE HCL 1 MG/ML IJ SOLN
0.5000 mg | INTRAMUSCULAR | Status: DC | PRN
  Administered 2021-08-27 – 2021-08-29 (×12): 0.5 mg via INTRAVENOUS
  Filled 2021-08-27 (×12): qty 1

## 2021-08-27 MED ORDER — SODIUM CHLORIDE 0.9 % IV SOLN
12.5000 mg | Freq: Four times a day (QID) | INTRAVENOUS | Status: DC | PRN
  Administered 2021-08-27 – 2021-08-28 (×2): 12.5 mg via INTRAVENOUS
  Filled 2021-08-27 (×3): qty 0.5

## 2021-08-27 MED ORDER — INSULIN ASPART 100 UNIT/ML IJ SOLN
0.0000 [IU] | INTRAMUSCULAR | Status: DC
  Administered 2021-08-27 (×2): 2 [IU] via SUBCUTANEOUS
  Administered 2021-08-27: 1 [IU] via SUBCUTANEOUS
  Administered 2021-08-27 – 2021-08-28 (×3): 2 [IU] via SUBCUTANEOUS
  Administered 2021-08-28 (×4): 1 [IU] via SUBCUTANEOUS

## 2021-08-27 MED ORDER — ONDANSETRON HCL 4 MG/2ML IJ SOLN
4.0000 mg | Freq: Once | INTRAMUSCULAR | Status: AC
  Administered 2021-08-27: 4 mg via INTRAVENOUS
  Filled 2021-08-27: qty 2

## 2021-08-27 MED ORDER — ARIPIPRAZOLE 5 MG PO TABS
5.0000 mg | ORAL_TABLET | Freq: Every day | ORAL | Status: DC
Start: 2021-08-27 — End: 2021-08-29
  Administered 2021-08-27 – 2021-08-29 (×3): 5 mg via ORAL
  Filled 2021-08-27 (×3): qty 1

## 2021-08-27 MED ORDER — ONDANSETRON HCL 4 MG/2ML IJ SOLN
4.0000 mg | Freq: Four times a day (QID) | INTRAMUSCULAR | Status: DC | PRN
  Administered 2021-08-27 (×2): 4 mg via INTRAVENOUS
  Filled 2021-08-27 (×2): qty 2

## 2021-08-27 MED ORDER — ACETAMINOPHEN 325 MG PO TABS: 650.0000 mg | ORAL_TABLET | Freq: Four times a day (QID) | ORAL | Status: DC | PRN

## 2021-08-27 MED ORDER — METOCLOPRAMIDE HCL 5 MG/ML IJ SOLN
10.0000 mg | Freq: Four times a day (QID) | INTRAMUSCULAR | Status: DC | PRN
  Administered 2021-08-27: 10 mg via INTRAVENOUS
  Filled 2021-08-27: qty 2

## 2021-08-27 NOTE — Plan of Care (Signed)
  Problem: Education: Goal: Knowledge of General Education information will improve Description Including pain rating scale, medication(s)/side effects and non-pharmacologic comfort measures Outcome: Progressing   

## 2021-08-27 NOTE — Progress Notes (Signed)
New Admission Note:  ? ?Arrival Method: via stretcher from ED ?Mental Orientation: alert and oriented x4 ?Telemetry: N/A ?Assessment: to be completed ?Skin: MASD gluteal cleft ?IV: LAC, saline locked ?Pain: 7/10 ?Tubes: None ?Safety Measures: Safety Fall Prevention Plan has been discussed  ?Admission: to be completed ?[facility] [place] Orientation: Patient has been oriented to the room, unit and staff.   ?Family: none at bedside ? ?Orders to be reviewed and implemented. Will continue to monitor the patient. Call light has been placed within reach and bed alarm has been activated.  ? ?

## 2021-08-27 NOTE — Progress Notes (Signed)
?PROGRESS NOTE ? ? ? ?[name]  ZOX:[id number] DOB: November 06, 1970 DOA: 08/26/2021 ?PCP: Ferd Hibbs, NP ? ? ?Brief Narrative:  ?HPI: [name] is a pleasant 51 y.o. female with medical history significant for hypertension, depression, anxiety, PTSD, chronic pain, type 2 diabetes mellitus, and hypothyroidism, now presenting to the emergency department with abdominal pain, nausea, vomiting, and diarrhea.  Patient reports that she woke in her usual state, had a salad yesterday morning, and then developed pain in the central abdomen with nausea, vomiting, and diarrhea at approximately 9 AM.  She had 5 loose stools and too many episodes of vomiting for her to count.  She denies any fever, chills, recent travel, melena, hematochezia, or hematemesis.  She reports that the symptoms are essentially the same as what she experienced last month when she was hospitalized.  She denies any recent change in her medications, denies headache, and denies drug use. ?  ?ED Course: Upon arrival to the ED, patient is found to be afebrile and saturating well on room air with heart rate in the 120s and elevated blood pressure.  CT of the abdomen pelvis is negative for acute findings but notable for hepatic steatosis.  Chemistry panel features a glucose 214, anion gap 20, bicarbonate 18, creatinine 1.2, and AST 66.  CBC features a leukocytosis, thrombocytosis, and polycythemia.  COVID and influenza PCR are negative.  Lipase was normal.  Patient was treated with multiple doses of fentanyl, Reglan, Zofran, Compazine, Ativan, and 2 L of saline in the ED. ? ?Assessment & Plan: ?  ?Principal Problem: ?  Intractable vomiting ?Active Problems: ?  Chronic lower back pain ?  Depression with anxiety ?  AKI (acute kidney injury) ([place]) ?  Hypertension ?  Starvation ketoacidosis ?  Viral gastroenteritis ? ?Intractable nausea and vomiting/abdominal pain/?  Viral gastroenteritis: With CT abdomen being negative, this is likely viral  gastroenteritis based on the history that patient started having symptoms 3 to 4 hours after eating a salad.  She has not had any more diarrhea but she still having vomiting.  Zofran is not helping, will add Phenergan.  Continue IV fluids.  Continue clear liquid diet. ? ?Mild AKI with anion gap metabolic acidosis: Creatinine has improved but CO2 slightly low.  We will continue normal saline and monitor and repeat labs in the morning. ? ?Type 2 diabetes mellitus: A1c was 7.2% in April 2023.  Continue SSI. ? ?Depression/anxiety: Continue home regimen. ? ?Acquired hypothyroidism: Continue Synthroid. ? ?Essential hypertension: Blood pressure significantly elevated, likely due to stress and nausea and vomiting, continue Coreg, add as needed hydralazine. ? ?Hyperlipidemia: Continue atorvastatin. ? ?DVT prophylaxis: enoxaparin (LOVENOX) injection 40 mg Start: 08/27/21 1000 ?  Code Status: Full Code  ?Family Communication:  None present at bedside.  Plan of care discussed with patient in length and he/she verbalized understanding and agreed with it. ? ?Status is: Inpatient ?Remains inpatient appropriate because: Still symptomatic. ? ? ?Estimated body mass index is 39.48 kg/m? as calculated from the following: ?  Height as of this encounter: '5\' 4"'$  (1.626 m). ?  Weight as of this encounter: 104.3 kg. ? ?  ?Nutritional Assessment: ?Body mass index is 39.48 kg/m?Marland KitchenMarland Kitchen ?Seen by dietician.  I agree with the assessment and plan as outlined below: ?Nutrition Status: ?  ?  ?  ? ?. ?Skin Assessment: ?I have examined the patient's skin and I agree with the wound assessment as performed by the wound care RN as outlined below: ?  ? ?Consultants:  ?  None ? ?Procedures:  ?None ? ?Antimicrobials:  ?Anti-infectives (From admission, onward)  ? ? None  ? ?  ?  ? ? ?Subjective: ?Seen and examined.  Still symptomatic with nausea and vomiting as well as abdominal pain.  No diarrhea though.  No other complaint. ? ?Objective: ?Vitals:  ? 08/27/21 0306  08/27/21 0407 08/27/21 7846 08/27/21 0615  ?BP: (!) 159/82 (!) 161/123 (!) 157/109 (!) 164/98  ?Pulse: (!) 109 (!) 121 (!) 117 (!) 117  ?Resp: '16 14 12 18  '$ ?Temp:  99.4 ?F (37.4 ?C) 98 ?F (36.7 ?C) 99.4 ?F (37.4 ?C)  ?TempSrc:  Oral Oral   ?SpO2: 100% 97% 94% 95%  ?Weight:      ?Height:      ? ? ?Intake/Output Summary (Last 24 hours) at 08/27/2021 1046 ?Last data filed at 08/27/2021 0900 ?Gross per 24 hour  ?Intake 1293.74 ml  ?Output 0 ml  ?Net 1293.74 ml  ? ?Filed Weights  ? 08/26/21 2050  ?Weight: 104.3 kg  ? ? ?Examination: ? ?General exam: Appears calm and comfortable, obese ?Respiratory system: Clear to auscultation. Respiratory effort normal. ?Cardiovascular system: S1 & S2 heard, RRR. No JVD, murmurs, rubs, gallops or clicks. No pedal edema. ?Gastrointestinal system: Abdomen is nondistended, soft and moderate generalized abdominal tenderness, no organomegaly or masses felt. Normal bowel sounds heard. ?Central nervous system: Alert and oriented. No focal neurological deficits. ?Extremities: Symmetric 5 x 5 power. ?Skin: No rashes, lesions or ulcers ?Psychiatry: Judgement and insight appear normal. Mood & affect appropriate.  ? ? ?Data Reviewed: I have personally reviewed following labs and imaging studies ? ?CBC: ?Recent Labs  ?Lab 08/26/21 ?2114 08/27/21 ?0059 08/27/21 ?0752  ?WBC 17.0*  --  18.1*  ?HGB 17.4* 18.4* 15.4*  ?HCT 54.8* 54.0* 46.9*  ?MCV 94.3  --  92.9  ?PLT 524*  --  346  ? ?Basic Metabolic Panel: ?Recent Labs  ?Lab 08/26/21 ?2114 08/27/21 ?0059 08/27/21 ?0752  ?NA 137 140 142  ?K 4.2 3.9 3.5  ?CL 99  --  108  ?CO2 18*  --  16*  ?GLUCOSE 214*  --  190*  ?BUN 9  --  7  ?CREATININE 1.20*  --  1.01*  ?CALCIUM 10.1  --  9.1  ?MG  --   --  1.5*  ? ?GFR: ?Estimated Creatinine Clearance: 78.4 mL/min (A) (by C-G formula based on SCr of 1.01 mg/dL (H)). ?Liver Function Tests: ?Recent Labs  ?Lab 08/26/21 ?2114 08/27/21 ?9629  ?AST 66* 46*  ?ALT 41 36  ?ALKPHOS 104 85  ?BILITOT 0.8 0.6  ?PROT 9.0* 7.7   ?ALBUMIN 4.1 3.6  ? ?Recent Labs  ?Lab 08/26/21 ?2114  ?LIPASE 37  ? ?No results for input(s): AMMONIA in the last 168 hours. ?Coagulation Profile: ?No results for input(s): INR, PROTIME in the last 168 hours. ?Cardiac Enzymes: ?No results for input(s): CKTOTAL, CKMB, CKMBINDEX, TROPONINI in the last 168 hours. ?BNP (last 3 results) ?No results for input(s): PROBNP in the last 8760 hours. ?HbA1C: ?No results for input(s): HGBA1C in the last 72 hours. ?CBG: ?Recent Labs  ?Lab 08/27/21 ?0213 08/27/21 ?0522 08/27/21 ?0720  ?GLUCAP 203* 188* 194*  ? ?Lipid Profile: ?No results for input(s): CHOL, HDL, LDLCALC, TRIG, CHOLHDL, LDLDIRECT in the last 72 hours. ?Thyroid Function Tests: ?No results for input(s): TSH, T4TOTAL, FREET4, T3FREE, THYROIDAB in the last 72 hours. ?Anemia Panel: ?No results for input(s): VITAMINB12, FOLATE, FERRITIN, TIBC, IRON, RETICCTPCT in the last 72 hours. ?Sepsis Labs: ?No results for input(s):  PROCALCITON, LATICACIDVEN in the last 168 hours. ? ?Recent Results (from the past 240 hour(s))  ?Resp Panel by RT-PCR (Flu A&B, Covid) Nasopharyngeal Swab     Status: None  ? Collection Time: 08/26/21  8:54 PM  ? Specimen: Nasopharyngeal Swab; Nasopharyngeal(NP) swabs in vial transport medium  ?Result Value Ref Range Status  ? SARS Coronavirus 2 by RT PCR NEGATIVE NEGATIVE Final  ?  Comment: (NOTE) ?SARS-CoV-2 target nucleic acids are NOT DETECTED. ? ?The SARS-CoV-2 RNA is generally detectable in upper respiratory ?specimens during the acute phase of infection. The lowest ?concentration of SARS-CoV-2 viral copies this assay can detect is ?138 copies/mL. A negative result does not preclude SARS-Cov-2 ?infection and should not be used as the sole basis for treatment or ?other patient management decisions. A negative result may occur with  ?improper specimen collection/handling, submission of specimen other ?than nasopharyngeal swab, presence of viral mutation(s) within the ?areas targeted by this assay,  and inadequate number of viral ?copies(<138 copies/mL). A negative result must be combined with ?clinical observations, patient history, and epidemiological ?information. The expected result is Negative. ? ?Fact Sheet f

## 2021-08-27 NOTE — ED Notes (Signed)
Patient transported to CT 

## 2021-08-27 NOTE — H&P (Signed)
?History and Physical  ? ? ?[name] HRC:[id number] DOB: 31-Dec-1970 DOA: 08/26/2021 ? ?PCP: Ferd Hibbs, NP  ? ?Patient coming from: Home  ? ?Chief Complaint: Abdominal pain, N/V/D  ? ?HPI: [name] is a pleasant 51 y.o. female with medical history significant for hypertension, depression, anxiety, PTSD, chronic pain, type 2 diabetes mellitus, and hypothyroidism, now presenting to the emergency department with abdominal pain, nausea, vomiting, and diarrhea.  Patient reports that she woke in her usual state, had a salad yesterday morning, and then developed pain in the central abdomen with nausea, vomiting, and diarrhea at approximately 9 AM.  She had 5 loose stools and too many episodes of vomiting for her to count.  She denies any fever, chills, recent travel, melena, hematochezia, or hematemesis.  She reports that the symptoms are essentially the same as what she experienced last month when she was hospitalized.  She denies any recent change in her medications, denies headache, and denies drug use. ? ?ED Course: Upon arrival to the ED, patient is found to be afebrile and saturating well on room air with heart rate in the 120s and elevated blood pressure.  CT of the abdomen pelvis is negative for acute findings but notable for hepatic steatosis.  Chemistry panel features a glucose 214, anion gap 20, bicarbonate 18, creatinine 1.2, and AST 66.  CBC features a leukocytosis, thrombocytosis, and polycythemia.  COVID and influenza PCR are negative.  Lipase was normal.  Patient was treated with multiple doses of fentanyl, Reglan, Zofran, Compazine, Ativan, and 2 L of saline in the ED. ? ?Review of Systems:  ?All other systems reviewed and apart from HPI, are negative. ? ?Past Medical History:  ?Diagnosis Date  ? Anxiety   ? Arthritis   ? "joints ache all over" (10/15/2014)  ? Barrett's esophagus   ? Bulging lumbar disc   ? Chronic lower back pain   ? DDD (degenerative disc disease), cervical   ? Depression    ? Drug-seeking behavior   ? Headache   ? "weekly" (10/15/2014)  ? Hyperlipemia   ? Hypertension   ? PTSD (post-traumatic stress disorder)   ? Skin cancer   ? "had them cut off my arms; don't know what kind"  ? ? ?Past Surgical History:  ?Procedure Laterality Date  ? ABLATION ON ENDOMETRIOSIS  2008  ? BIOPSY  12/27/2018  ? Procedure: BIOPSY;  Surgeon: Thornton Park, MD;  Location: [facility];  Service: Gastroenterology;;  ? ESOPHAGOGASTRODUODENOSCOPY (EGD) WITH PROPOFOL N/A 12/27/2018  ? Procedure: ESOPHAGOGASTRODUODENOSCOPY (EGD) WITH PROPOFOL;  Surgeon: Thornton Park, MD;  Location: [facility];  Service: Gastroenterology;  Laterality: N/A;  ? HEMORRHOID SURGERY  ~ 2002  ? IR FLUORO GUIDE CV LINE RIGHT  06/14/2020  ? IR REMOVAL TUN CV CATH W/O FL  06/23/2020  ? IR US GUIDE VASC ACCESS RIGHT  06/14/2020  ? ORIF ANKLE FRACTURE Right 03/28/2020  ? Procedure: OPEN REDUCTION INTERNAL FIXATION (ORIF) RIGHT BIMALLEOLAR ANKLE FRACTURE;  Surgeon: Marchia Bond, MD;  Location: [facility];  Service: Orthopedics;  Laterality: Right;  ? ? ?Social History:  ? reports that she has never smoked. She has never used smokeless tobacco. She reports that she does not currently use alcohol. She reports that she does not use drugs. ? ?Allergies  ?Allergen Reactions  ? Lactose Intolerance (Gi) Nausea Only  ? ? ?Family History  ?Problem Relation Age of Onset  ? Breast cancer Mother   ? Diabetes Mother   ? Breast  cancer Maternal Grandmother   ? Breast cancer Paternal Grandmother   ? Colon polyps Paternal Grandmother   ? Colon cancer Neg Hx   ? Esophageal cancer Neg Hx   ? Rectal cancer Neg Hx   ? Stomach cancer Neg Hx   ? ? ? ?Prior to Admission medications   ?Medication Sig Start Date End Date Taking? Authorizing Provider  ?acetaminophen (TYLENOL) 325 MG tablet Take 2 tablets (650 mg total) by mouth every 6 (six) hours as needed for mild pain, fever or headache. ?Patient taking differently: Take 650 mg by mouth 2 (two)  times daily as needed for mild pain, fever or headache. 07/01/20   Angiulli, Lavon Paganini, PA-C  ?ARIPiprazole (ABILIFY) 5 MG tablet Take 1 tablet (5 mg total) by mouth daily. 08/01/20   Jamse Arn, MD  ?atorvastatin (LIPITOR) 10 MG tablet Take 10 mg by mouth daily. 08/26/20   [provider]  ?blood glucose meter kit and supplies KIT Dispense based on patient and insurance preference. Use up to four times daily as directed. 08/03/21   Antonieta Pert, MD  ?carvedilol (COREG) 6.25 MG tablet Take 1 tablet (6.25 mg total) by mouth 2 (two) times daily with a meal. 08/01/20   Jamse Arn, MD  ?diphenhydrAMINE HCl (BENADRYL PO) Take 3 tablets by mouth at bedtime as needed (sleep).    [provider]  ?EUTHYROX 25 MCG tablet Take 25 mcg by mouth every morning. 08/26/20   [provider]  ?ferrous sulfate 325 (65 FE) MG tablet Take 1 tablet (325 mg total) by mouth daily with breakfast. 07/01/20   Angiulli, Lavon Paganini, PA-C  ?FLUoxetine (PROZAC) 40 MG capsule Take 1 capsule (40 mg total) by mouth daily. 07/01/20   Angiulli, Lavon Paganini, PA-C  ?melatonin 300 MCG TABS Take 5 tablets (1,500 mcg total) by mouth at bedtime. 07/01/20   Angiulli, Lavon Paganini, PA-C  ?metoCLOPramide (REGLAN) 10 MG tablet Take 1 tablet (10 mg total) by mouth 3 (three) times daily before meals. 07/01/20   Angiulli, Lavon Paganini, PA-C  ?multivitamin (RENA-VIT) TABS tablet Take 1 tablet by mouth at bedtime. 08/01/20   Jamse Arn, MD  ?omeprazole (PRILOSEC) 20 MG capsule Take 1 capsule (20 mg total) by mouth daily. 08/01/20   Jamse Arn, MD  ?ondansetron (ZOFRAN-ODT) 4 MG disintegrating tablet Take 1 tablet (4 mg total) by mouth every 8 (eight) hours as needed for nausea or vomiting. 08/03/21 09/02/21  Antonieta Pert, MD  ?oxyCODONE-acetaminophen (PERCOCET) 7.5-325 MG tablet Take 1 tablet by mouth See admin instructions. Take 7.5 - 325 mg 5 times daily    [provider]  ?prazosin (MINIPRESS) 2 MG capsule Take 1 capsule (2 mg  total) by mouth at bedtime. 08/01/20   Jamse Arn, MD  ?promethazine (PHENERGAN) 25 MG suppository Place 1 suppository (25 mg total) rectally daily as needed for up to 30 doses for nausea or vomiting. 08/03/21   Antonieta Pert, MD  ?promethazine (PHENERGAN) 25 MG tablet Take 25 mg by mouth 2 (two) times daily as needed for nausea or vomiting. 04/11/21   [provider]  ?Simethicone (GAS-X PO) Take 2 tablets by mouth daily as needed (gas).    [provider]  ?tiZANidine (ZANAFLEX) 4 MG capsule Take 4 mg by mouth in the morning, at noon, in the evening, and at bedtime.    [provider]  ?traZODone (DESYREL) 50 MG tablet Take 50 mg by mouth in the morning and at bedtime. 10/28/20  [provider]  ? ? ?Physical Exam: ?Vitals:  ? 08/27/21 0045 08/27/21 0100 08/27/21 0306 08/27/21 0407  ?BP: (!) 146/120 (!) 163/144 (!) 159/82 (!) 161/123  ?Pulse: (!) 112 (!) 114 (!) 109 (!) 121  ?Resp: (!) 35 _0 ?Temp:    99.4 ?F (37.4 ?C)  ?TempSrc:    Oral  ?SpO2: 95% 99% 100% 97%  ?Weight:      ?Height:      ? ? ? ?Constitutional: NAD, calm  ?Eyes: PERTLA, lids and conjunctivae normal ?ENMT: Mucous membranes are dry. Posterior pharynx clear of any exudate or lesions.   ?Neck: supple, no masses  ?Respiratory: no wheezing, no crackles. No accessory muscle use.  ?Cardiovascular: S1 & S2 heard, regular rate and rhythm. No extremity edema.   ?Abdomen: Soft, no rebound pain or guarding. Bowel sounds active.  ?Musculoskeletal: no clubbing / cyanosis. No joint deformity upper and lower extremities.   ?Skin: no significant rashes, lesions, ulcers. Warm, dry, well-perfused. ?Neurologic: CN 2-12 grossly intact. Moving all extremities. Alert and oriented.  ?Psychiatric: Calm. Cooperative.  ? ? ?Labs and Imaging on Admission: I have personally reviewed following labs and imaging studies ? ?CBC: ?Recent Labs  ?Lab 08/26/21 ?2114 08/27/21 ?3142  ?WBC 17.0*  --   ?HGB 17.4* 18.4*  ?HCT 54.8* 54.0*  ?MCV  94.3  --   ?PLT 524*  --   ? ?Basic Metabolic Panel: ?Recent Labs  ?Lab 08/26/21 ?2114 08/27/21 ?7670  ?NA 137 140  ?K 4.2 3.9  ?CL 99  --   ?CO2 18*  --   ?GLUCOSE 214*  --   ?BUN 9  --   ?CREATININE 1.20

## 2021-08-27 NOTE — ED Notes (Signed)
Pt continuing to experience n/v and pain after various medications and interventions. Baird Cancer PA made aware ?

## 2021-08-27 NOTE — Plan of Care (Signed)
?  Problem: Education: ?Goal: Knowledge of General Education information will improve ?Description: Including pain rating scale, medication(s)/side effects and non-pharmacologic comfort measures ?08/27/2021 1449 by Jilda Roche, RN ?Outcome: Progressing ?08/27/2021 1449 by Jilda Roche, RN ?Outcome: Progressing ?  ?

## 2021-08-27 NOTE — ED Notes (Signed)
Breakfast order placed ?

## 2021-08-28 LAB — CBC
HCT: 42.2 % (ref 36.0–46.0)
Hemoglobin: 14 g/dL (ref 12.0–15.0)
MCH: 30.5 pg (ref 26.0–34.0)
MCHC: 33.2 g/dL (ref 30.0–36.0)
MCV: 91.9 fL (ref 80.0–100.0)
Platelets: 292 10*3/uL (ref 150–400)
RBC: 4.59 MIL/uL (ref 3.87–5.11)
RDW: 13.8 % (ref 11.5–15.5)
WBC: 8.8 10*3/uL (ref 4.0–10.5)
nRBC: 0 % (ref 0.0–0.2)

## 2021-08-28 LAB — GLUCOSE, CAPILLARY
Glucose-Capillary: 113 mg/dL — ABNORMAL HIGH (ref 70–99)
Glucose-Capillary: 129 mg/dL — ABNORMAL HIGH (ref 70–99)
Glucose-Capillary: 130 mg/dL — ABNORMAL HIGH (ref 70–99)
Glucose-Capillary: 133 mg/dL — ABNORMAL HIGH (ref 70–99)
Glucose-Capillary: 141 mg/dL — ABNORMAL HIGH (ref 70–99)
Glucose-Capillary: 163 mg/dL — ABNORMAL HIGH (ref 70–99)
Glucose-Capillary: 195 mg/dL — ABNORMAL HIGH (ref 70–99)

## 2021-08-28 LAB — COMPREHENSIVE METABOLIC PANEL
ALT: 40 U/L (ref 0–44)
AST: 108 U/L — ABNORMAL HIGH (ref 15–41)
Albumin: 3.5 g/dL (ref 3.5–5.0)
Alkaline Phosphatase: 70 U/L (ref 38–126)
Anion gap: 14 (ref 5–15)
BUN: 7 mg/dL (ref 6–20)
CO2: 22 mmol/L (ref 22–32)
Calcium: 8.9 mg/dL (ref 8.9–10.3)
Chloride: 106 mmol/L (ref 98–111)
Creatinine, Ser: 0.94 mg/dL (ref 0.44–1.00)
GFR, Estimated: >60 mL/min (ref >60)
Glucose, Bld: 148 mg/dL — ABNORMAL HIGH (ref 70–99)
Potassium: 5.5 mmol/L — ABNORMAL HIGH (ref 3.5–5.1)
Sodium: 142 mmol/L (ref 135–145)
Total Bilirubin: UNDETERMINED mg/dL (ref 0.3–1.2)
Total Protein: 7.1 g/dL (ref 6.5–8.1)

## 2021-08-28 MED ORDER — SODIUM CHLORIDE 0.9 % IV SOLN: INTRAVENOUS | Status: AC

## 2021-08-28 NOTE — Progress Notes (Addendum)
PROGRESS NOTE    [name]  QIO:[id number] DOB: 1970/11/05 DOA: 08/26/2021 PCP: Lance Bosch, NP   Brief Narrative:  HPI: [name] is a pleasant 51 y.o. female with medical history significant for hypertension, depression, anxiety, PTSD, chronic pain, type 2 diabetes mellitus, and hypothyroidism, now presenting to the emergency department with abdominal pain, nausea, vomiting, and diarrhea.  Patient reports that she woke in her usual state, had a salad yesterday morning, and then developed pain in the central abdomen with nausea, vomiting, and diarrhea at approximately 9 AM.  She had 5 loose stools and too many episodes of vomiting for her to count.  She denies any fever, chills, recent travel, melena, hematochezia, or hematemesis.  She reports that the symptoms are essentially the same as what she experienced last month when she was hospitalized.  She denies any recent change in her medications, denies headache, and denies drug use.   ED Course: Upon arrival to the ED, patient is found to be afebrile and saturating well on room air with heart rate in the 120s and elevated blood pressure.  CT of the abdomen pelvis is negative for acute findings but notable for hepatic steatosis.  Chemistry panel features a glucose 214, anion gap 20, bicarbonate 18, creatinine 1.2, and AST 66.  CBC features a leukocytosis, thrombocytosis, and polycythemia.  COVID and influenza PCR are negative.  Lipase was normal.  Patient was treated with multiple doses of fentanyl, Reglan, Zofran, Compazine, Ativan, and 2 L of saline in the ED.  Assessment & Plan:   Principal Problem:   Intractable vomiting Active Problems:   Chronic lower back pain   Depression with anxiety   AKI (acute kidney injury) (HCC)   Hypertension   Starvation ketoacidosis   Viral gastroenteritis  Intractable nausea and vomiting/abdominal pain/?  Viral gastroenteritis: With CT abdomen being negative, this is likely viral  gastroenteritis based on the history that patient started having symptoms 3 to 4 hours after eating a salad.  She has not had any more diarrhea this admission.  Last vomiting almost 4 hours ago.  No more nausea.  Tolerating clear liquid diet, will advance to full liquid diet, patient in agreement.  We will see how she does by lunch, if she does well, will advance to soft diet for dinner.  Mild AKI with anion gap metabolic acidosis: Resolved.  Hyperkalemia: potassium 5.5.  Will start on normal saline for 1 L, hopefully that will help.  Type 2 diabetes mellitus: A1c was 7.2% in April 2023.  Continue SSI.  Depression/anxiety: Continue home regimen.  Acquired hypothyroidism: Continue Synthroid.  Essential hypertension: Blood pressure much better and slightly on the lower normal side, continue Coreg, add as needed hydralazine.  Hyperlipidemia: Continue atorvastatin.  DVT prophylaxis: enoxaparin (LOVENOX) injection 40 mg Start: 08/27/21 1000   Code Status: Full Code  Family Communication:  None present at bedside.  Plan of care discussed with patient in length and he/she verbalized understanding and agreed with it.  Status is: Inpatient Remains inpatient appropriate because: Still symptomatic.  Potential discharge tomorrow.   Estimated body mass index is 36.97 kg/m as calculated from the following:   Height as of this encounter: 5\' 4"  (1.626 m).   Weight as of this encounter: 97.7 kg.    Nutritional Assessment: Body mass index is 36.97 kg/m.Marland Kitchen Seen by dietician.  I agree with the assessment and plan as outlined below: Nutrition Status:        . Skin Assessment: I have  examined the patient's skin and I agree with the wound assessment as performed by the wound care RN as outlined below:    Consultants:  None  Procedures:  None  Antimicrobials:  Anti-infectives (From admission, onward)    None         Subjective:  Seen and examined.  Still with some nausea but  overall improved compared to yesterday.  No other complaint.  Objective: Vitals:   08/27/21 1709 08/27/21 2020 08/28/21 0442 08/28/21 0445  BP: 104/87 132/88 110/75   Pulse: (!) 116 86 (!) 102   Resp: 19 19 18    Temp: 97.8 F (36.6 C) 98.1 F (36.7 C) (!) 97.5 F (36.4 C)   TempSrc: Oral Oral Oral   SpO2: 94% 95% 95%   Weight:    97.7 kg  Height:    5\' 4"  (1.626 m)    Intake/Output Summary (Last 24 hours) at 08/28/2021 1018 Last data filed at 08/28/2021 5284 Gross per 24 hour  Intake 1581.12 ml  Output 0 ml  Net 1581.12 ml    Filed Weights   08/26/21 2050 08/28/21 0445  Weight: 104.3 kg 97.7 kg    Examination:  General exam: Appears calm and comfortable, obese Respiratory system: Clear to auscultation. Respiratory effort normal. Cardiovascular system: S1 & S2 heard, RRR. No JVD, murmurs, rubs, gallops or clicks. No pedal edema. Gastrointestinal system: Abdomen is nondistended, soft and nontender. No organomegaly or masses felt. Normal bowel sounds heard. Central nervous system: Alert and oriented. No focal neurological deficits. Extremities: Symmetric 5 x 5 power. Skin: No rashes, lesions or ulcers.  Psychiatry: Judgement and insight appear normal. Mood & affect appropriate.   Data Reviewed: I have personally reviewed following labs and imaging studies  CBC: Recent Labs  Lab 08/26/21 2114 08/27/21 0059 08/27/21 0752 08/28/21 0638  WBC 17.0*  --  18.1* 8.8  HGB 17.4* 18.4* 15.4* 14.0  HCT 54.8* 54.0* 46.9* 42.2  MCV 94.3  --  92.9 91.9  PLT 524*  --  346 292    Basic Metabolic Panel: Recent Labs  Lab 08/26/21 2114 08/27/21 0059 08/27/21 0752 08/28/21 0425  NA 137 140 142 142  K 4.2 3.9 3.5 5.5*  CL 99  --  108 106  CO2 18*  --  16* 22  GLUCOSE 214*  --  190* 148*  BUN 9  --  7 7  CREATININE 1.20*  --  1.01* 0.94  CALCIUM 10.1  --  9.1 8.9  MG  --   --  1.5*  --     GFR: Estimated Creatinine Clearance: 81.3 mL/min (by C-G formula based on SCr of  0.94 mg/dL). Liver Function Tests: Recent Labs  Lab 08/26/21 2114 08/27/21 0752 08/28/21 0425  AST 66* 46* 108*  ALT 41 36 40  ALKPHOS 104 85 70  BILITOT 0.8 0.6 QUANTITY NOT SUFFICIENT, UNABLE TO PERFORM TEST  PROT 9.0* 7.7 7.1  ALBUMIN 4.1 3.6 3.5    Recent Labs  Lab 08/26/21 2114  LIPASE 37    No results for input(s): AMMONIA in the last 168 hours. Coagulation Profile: No results for input(s): INR, PROTIME in the last 168 hours. Cardiac Enzymes: No results for input(s): CKTOTAL, CKMB, CKMBINDEX, TROPONINI in the last 168 hours. BNP (last 3 results) No results for input(s): PROBNP in the last 8760 hours. HbA1C: No results for input(s): HGBA1C in the last 72 hours. CBG: Recent Labs  Lab 08/27/21 1708 08/27/21 2024 08/27/21 2350 08/28/21 0406 08/28/21 1324  GLUCAP 165* 147* 109* 141* 113*    Lipid Profile: No results for input(s): CHOL, HDL, LDLCALC, TRIG, CHOLHDL, LDLDIRECT in the last 72 hours. Thyroid Function Tests: No results for input(s): TSH, T4TOTAL, FREET4, T3FREE, THYROIDAB in the last 72 hours. Anemia Panel: No results for input(s): VITAMINB12, FOLATE, FERRITIN, TIBC, IRON, RETICCTPCT in the last 72 hours. Sepsis Labs: No results for input(s): PROCALCITON, LATICACIDVEN in the last 168 hours.  Recent Results (from the past 240 hour(s))  Resp Panel by RT-PCR (Flu A&B, Covid) Nasopharyngeal Swab     Status: None   Collection Time: 08/26/21  8:54 PM   Specimen: Nasopharyngeal Swab; Nasopharyngeal(NP) swabs in vial transport medium  Result Value Ref Range Status   SARS Coronavirus 2 by RT PCR NEGATIVE NEGATIVE Final    Comment: (NOTE) SARS-CoV-2 target nucleic acids are NOT DETECTED.  The SARS-CoV-2 RNA is generally detectable in upper respiratory specimens during the acute phase of infection. The lowest concentration of SARS-CoV-2 viral copies this assay can detect is 138 copies/mL. A negative result does not preclude SARS-Cov-2 infection and  should not be used as the sole basis for treatment or other patient management decisions. A negative result may occur with  improper specimen collection/handling, submission of specimen other than nasopharyngeal swab, presence of viral mutation(s) within the areas targeted by this assay, and inadequate number of viral copies(<138 copies/mL). A negative result must be combined with clinical observations, patient history, and epidemiological information. The expected result is Negative.  Fact Sheet for Patients:  BloggerCourse.com  Fact Sheet for Healthcare Providers:  SeriousBroker.it  This test is no t yet approved or cleared by the [place] FDA and  has been authorized for detection and/or diagnosis of SARS-CoV-2 by FDA under an Emergency Use Authorization (EUA). This EUA will remain  in effect (meaning this test can be used) for the duration of the COVID-19 declaration under Section 564(b)(1) of the Act, 21 U.S.C.section 360bbb-3(b)(1), unless the authorization is terminated  or revoked sooner.       Influenza A by PCR NEGATIVE NEGATIVE Final   Influenza B by PCR NEGATIVE NEGATIVE Final    Comment: (NOTE) The Xpert Xpress SARS-CoV-2/FLU/RSV plus assay is intended as an aid in the diagnosis of influenza from Nasopharyngeal swab specimens and should not be used as a sole basis for treatment. Nasal washings and aspirates are unacceptable for Xpert Xpress SARS-CoV-2/FLU/RSV testing.  Fact Sheet for Patients: BloggerCourse.com  Fact Sheet for Healthcare Providers: SeriousBroker.it  This test is not yet approved or cleared by the [place] FDA and has been authorized for detection and/or diagnosis of SARS-CoV-2 by FDA under an Emergency Use Authorization (EUA). This EUA will remain in effect (meaning this test can be used) for the duration of the COVID-19 declaration  under Section 564(b)(1) of the Act, 21 U.S.C. section 360bbb-3(b)(1), unless the authorization is terminated or revoked.  Performed at [facility], [address], [place], [place] [address]   MRSA Next Gen by PCR, Nasal     Status: None   Collection Time: 08/27/21  6:47 AM   Specimen: Nasal Mucosa; Nasal Swab  Result Value Ref Range Status   MRSA by PCR Next Gen NOT DETECTED NOT DETECTED Final    Comment: (NOTE) The GeneXpert MRSA Assay (FDA approved for NASAL specimens only), is one component of a comprehensive MRSA colonization surveillance program. It is not intended to diagnose MRSA infection nor to guide or monitor treatment for MRSA infections. Test performance is not  FDA approved in patients less than 22 years old. Performed at [facility], [address], [place], [place] [address]       Radiology Studies: CT ABDOMEN PELVIS W CONTRAST  Result Date: 08/27/2021 CLINICAL DATA:  Abdominal pain with nausea, vomiting and diarrhea. EXAM: CT ABDOMEN AND PELVIS WITH CONTRAST TECHNIQUE: Multidetector CT imaging of the abdomen and pelvis was performed using the standard protocol following bolus administration of intravenous contrast. RADIATION DOSE REDUCTION: This exam was performed according to the departmental dose-optimization program which includes automated exposure control, adjustment of the mA and/or kV according to patient size and/or use of iterative reconstruction technique. CONTRAST:  OMNIPAQUE IOHEXOL 350 MG/ML SOLN COMPARISON:  August 01, 2021 FINDINGS: Lower chest: No acute abnormality. Hepatobiliary: There is diffuse fatty infiltration of the liver parenchyma. No focal liver abnormality is seen. No gallstones, gallbladder wall thickening, or biliary dilatation. Pancreas: Unremarkable. No pancreatic ductal dilatation or surrounding inflammatory changes. Spleen: Normal in size without focal abnormality. Adrenals/Urinary Tract: Adrenal glands are unremarkable.  Kidneys are normal, without renal calculi, focal lesion, or hydronephrosis. Bladder is unremarkable. Stomach/Bowel: A small, stable left diaphragmatic hernia is seen which contains a small portion of the gastric fundus. Appendix appears normal. No evidence of bowel wall thickening, distention, or inflammatory changes. Vascular/Lymphatic: Aortic atherosclerosis. No enlarged abdominal or pelvic lymph nodes. Reproductive: Uterus and bilateral adnexa are unremarkable. Other: No abdominal wall hernia or abnormality. No abdominopelvic ascites. Musculoskeletal: No acute or significant osseous findings. IMPRESSION: 1. No acute or active process within the abdomen or pelvis. 2. Hepatic steatosis. 3. Aortic atherosclerosis. Aortic Atherosclerosis (ICD10-I70.0). Electronically Signed   By: Aram Candela M.D.   On: 08/27/2021 01:54    Scheduled Meds:  ARIPiprazole  5 mg Oral Daily   atorvastatin  10 mg Oral Daily   carvedilol  6.25 mg Oral BID WC   enoxaparin (LOVENOX) injection  40 mg Subcutaneous Q24H   FLUoxetine  40 mg Oral Daily   insulin aspart  0-9 Units Subcutaneous Q4H   levothyroxine  25 mcg Oral q morning   pantoprazole  40 mg Oral Daily   prazosin  2 mg Oral QHS   tiZANidine  4 mg Oral TID   traZODone  50 mg Oral QHS   Continuous Infusions:  sodium chloride     promethazine (PHENERGAN) injection (IM or IVPB) 12.5 mg (08/28/21 0434)     LOS: 1 day   Hughie Closs, MD [facility]  08/28/2021, 10:18 AM   *Please note that this is a verbal dictation therefore any spelling or grammatical errors are due to the "Dragon Medical One" system interpretation.  Please page via Amion and do not message via secure chat for urgent patient care matters. Secure chat can be used for non urgent patient care matters.  How to contact the [facility] Attending or Consulting provider 7A - 7P or covering provider during after hours 7P -7A, for this patient?  Check the care team in [facility] and look for a)  attending/consulting [facility] provider listed and b) the [facility] team listed. Page or secure chat 7A-7P. Log into [email or web address] and use [facility] universal password to access. If you do not have the password, please contact the hospital operator. Locate the [facility] provider you are looking for under [facility] and page to a number that you can be directly reached. If you still have difficulty reaching the provider, please page the [facility] (Director on Call) for the Hospitalists listed on amion for assistance.

## 2021-08-28 NOTE — Progress Notes (Signed)
?  Transition of Care (TOC) Screening Note ? ? ?Patient Details  ?Name: [name] ?Date of Birth: 1970/06/21 ? ? ?Transition of Care (TOC) CM/SW Contact:    ?Tom-Johnson, Renea Ee, RN ?Phone Number: ?08/28/2021, 12:38 PM ? ?Patient is admitted for Gastroenteritis. From home with husband and one of her two children. The other child is in [place].  ?Currently on disability for PTSD and Bipolar. Does not drive, husband transports to and from appointments. Has a cane, walker, shower seat and grab bars at home.  ?PCP is Ferd Hibbs, NP and uses [facility] in [place].  ? ? ?Transition of Care Department [facility] has reviewed patient and no TOC needs or recommendations have been identified at this time. TOC will continue to monitor patient advancement through interdisciplinary progression rounds. If new patient transition needs arise, please place a TOC consult. ?  ?

## 2021-08-29 LAB — BASIC METABOLIC PANEL
Anion gap: 9 (ref 5–15)
BUN: 9 mg/dL (ref 6–20)
CO2: 22 mmol/L (ref 22–32)
Calcium: 8.6 mg/dL — ABNORMAL LOW (ref 8.9–10.3)
Chloride: 110 mmol/L (ref 98–111)
Creatinine, Ser: 0.88 mg/dL (ref 0.44–1.00)
GFR, Estimated: >60 mL/min (ref >60)
Glucose, Bld: 132 mg/dL — ABNORMAL HIGH (ref 70–99)
Potassium: 3.5 mmol/L (ref 3.5–5.1)
Sodium: 141 mmol/L (ref 135–145)

## 2021-08-29 LAB — GLUCOSE, CAPILLARY
Glucose-Capillary: 120 mg/dL — ABNORMAL HIGH (ref 70–99)
Glucose-Capillary: 114 mg/dL — ABNORMAL HIGH (ref 70–99)

## 2021-08-29 MED ORDER — ONDANSETRON 4 MG PO TBDP
4.0000 mg | ORAL_TABLET | Freq: Three times a day (TID) | ORAL | 0 refills | Status: DC | PRN
Start: 2021-08-29 — End: 2021-11-08

## 2021-08-29 MED ORDER — ONDANSETRON 4 MG PO TBDP
4.0000 mg | ORAL_TABLET | Freq: Three times a day (TID) | ORAL | 0 refills | Status: DC | PRN
Start: 2021-08-29 — End: 2021-09-18

## 2021-08-29 NOTE — Discharge Summary (Signed)
PatientPhysician Discharge Summary  ?[name] VJD:[id number] DOB: 08-05-1970 DOA: 08/26/2021 ? ?PCP: Ferd Hibbs, NP ? ?Admit date: 08/26/2021 ?Discharge date: 08/29/2021 ?30 Day Unplanned Readmission Risk Score   ? ?Flowsheet Row ED to Hosp-Admission (Current) from 08/26/2021 in [facility]  ?30 Day Unplanned Readmission Risk Score (%) 27.46 Filed at 08/29/2021 0801  ? ?  ? ? This score is the patient's risk of an unplanned readmission within 30 days of being discharged (0 -100%). The score is based on dignosis, age, lab data, medications, orders, and past utilization.   ?Low:  0-14.9   Medium: 15-21.9   High: 22-29.9   Extreme: 30 and above ? ?  ? ?  ? ? ? ?Admitted From: Home ?Disposition: Home ? ?Recommendations for Outpatient Follow-up:  ?Follow up with PCP in 1-2 weeks ?Please obtain BMP/CBC in one week ?Please follow up with your PCP on the following pending results: ?Unresulted Labs (From admission, onward)  ? ?  Start     Ordered  ? 09/03/21 0500  Creatinine, serum  (enoxaparin (LOVENOX)    CrCl >/= 30 ml/min)  Weekly,   R     ?Comments: while on enoxaparin therapy ?  ? 08/27/21 0424  ? ?  ?  ? ?  ?  ? ? ?Home Health: None ?Equipment/Devices: None ? ?Discharge Condition: Stable ?CODE STATUS: Full code ?Diet recommendation: Cardiac ? ?Subjective: Seen and examined.  Feels much better.  No more vomiting, has intermittent nausea.  No abdominal pain or diarrhea.  Tolerated soft diet.  She wants to go home. ? ?Brief/Interim Summary:  [name] is a pleasant 51 y.o. female with medical history significant for hypertension, depression, anxiety, PTSD, chronic pain, type 2 diabetes mellitus, and hypothyroidism, presented to the emergency department with abdominal pain, nausea, vomiting, and diarrhea since night before, the symptoms started approximately few hours after she ate at a restaurant. She reports that the symptoms are essentially the same as what she experienced last month when she  was hospitalized.  ? ?Upon arrival to ED, she was hemodynamically stable, CT of the abdomen pelvis is negative for acute findings but notable for hepatic steatosis.  Admitted under hospitalist service.  Further hospitalization details as below. ?  ?Intractable nausea and vomiting/abdominal pain/?  Viral gastroenteritis: With CT abdomen being negative, this is likely viral gastroenteritis based on the history that patient started having symptoms 3 to 4 hours after eating a salad.  Patient has improved as expected over time.  Has only intermittent nausea but she is able to tolerate soft diet.  No diarrhea or abdominal pain.  Looks well-hydrated.  Wants to go home.  That is reasonable.  She is being discharged in stable condition. ?  ?Mild AKI with anion gap metabolic acidosis: Resolved. ?  ?Hyperkalemia: Resolved. ?  ?Type 2 diabetes mellitus: A1c was 7.2% in April 2023.  Resume home medications. ?  ?Essential hypertension: Resume home medications. ?  ?Hyperlipidemia: Continue atorvastatin. ? ?Discharge plan was discussed with patient and/or family member and they verbalized understanding and agreed with it.  ?Discharge Diagnoses:  ?Principal Problem: ?  Intractable vomiting ?Active Problems: ?  Chronic lower back pain ?  Depression with anxiety ?  AKI (acute kidney injury) ([place]) ?  Hypertension ?  Starvation ketoacidosis ?  Viral gastroenteritis ? ? ? ?Discharge Instructions ? ? ?Allergies as of 08/29/2021   ?No Known Allergies ?  ? ?  ?Medication List  ?  ? ?STOP taking  these medications   ? ?ferrous sulfate 325 (65 FE) MG tablet ?  ?Melatonin 300 MCG Tabs ?  ?metoCLOPramide 10 MG tablet ?Commonly known as: REGLAN ?  ?promethazine 25 MG suppository ?Commonly known as: PHENERGAN ?  ?promethazine 25 MG tablet ?Commonly known as: PHENERGAN ?  ? ?  ? ?TAKE these medications   ? ?acetaminophen 325 MG tablet ?Commonly known as: TYLENOL ?Take 2 tablets (650 mg total) by mouth every 6 (six) hours as needed for mild pain,  fever or headache. ?What changed: when to take this ?  ?ARIPiprazole 5 MG tablet ?Commonly known as: ABILIFY ?Take 1 tablet (5 mg total) by mouth daily. ?  ?atorvastatin 10 MG tablet ?Commonly known as: LIPITOR ?Take 10 mg by mouth daily. ?  ?BENADRYL PO ?Take 3 tablets by mouth at bedtime as needed (sleep). ?  ?blood glucose meter kit and supplies Kit ?Dispense based on patient and insurance preference. Use up to four times daily as directed. ?  ?carvedilol 6.25 MG tablet ?Commonly known as: COREG ?Take 1 tablet (6.25 mg total) by mouth 2 (two) times daily with a meal. ?  ?Euthyrox 25 MCG tablet ?Generic drug: levothyroxine ?Take 25 mcg by mouth every morning. ?  ?FLUoxetine 40 MG capsule ?Commonly known as: PROZAC ?Take 1 capsule (40 mg total) by mouth daily. ?  ?GAS-X PO ?Take 2 tablets by mouth daily as needed (gas). ?  ?multivitamin Tabs tablet ?Take 1 tablet by mouth at bedtime. ?  ?omeprazole 20 MG capsule ?Commonly known as: PRILOSEC ?Take 1 capsule (20 mg total) by mouth daily. ?  ?ondansetron 4 MG disintegrating tablet ?Commonly known as: ZOFRAN-ODT ?Take 1 tablet (4 mg total) by mouth every 8 (eight) hours as needed for nausea or vomiting. ?What changed: Another medication with the same name was added. Make sure you understand how and when to take each. ?  ?ondansetron 4 MG disintegrating tablet ?Commonly known as: ZOFRAN-ODT ?Take 1 tablet (4 mg total) by mouth every 8 (eight) hours as needed for nausea or vomiting. ?What changed: You were already taking a medication with the same name, and this prescription was added. Make sure you understand how and when to take each. ?  ?oxyCODONE-acetaminophen 7.5-325 MG tablet ?Commonly known as: PERCOCET ?Take 1 tablet by mouth See admin instructions. Take 7.5 - 325 mg 5 times daily ?  ?prazosin 2 MG capsule ?Commonly known as: MINIPRESS ?Take 1 capsule (2 mg total) by mouth at bedtime. ?  ?tiZANidine 4 MG capsule ?Commonly known as: ZANAFLEX ?Take 4 mg by mouth in  the morning, at noon, in the evening, and at bedtime. ?  ?traZODone 50 MG tablet ?Commonly known as: DESYREL ?Take 50 mg by mouth in the morning and at bedtime. ?  ? ?  ? ? Follow-up Information   ? ? Ferd Hibbs, NP Follow up in 1 week(s).   ?Specialty: Nurse Practitioner ?Contact information: ?[address] ?[place] [address] ?[phone] ? ? ?  ?  ? ?  ?  ? ?  ? ?No Known Allergies ? ?Consultations: None ? ? ?Procedures/Studies: ?CT ABDOMEN PELVIS W CONTRAST ? ?Result Date: 08/27/2021 ?CLINICAL DATA:  Abdominal pain with nausea, vomiting and diarrhea. EXAM: CT ABDOMEN AND PELVIS WITH CONTRAST TECHNIQUE: Multidetector CT imaging of the abdomen and pelvis was performed using the standard protocol following bolus administration of intravenous contrast. RADIATION DOSE REDUCTION: This exam was performed according to the departmental dose-optimization program which includes automated exposure control, adjustment of the mA  and/or kV according to patient size and/or use of iterative reconstruction technique. CONTRAST:  170m OMNIPAQUE IOHEXOL 350 MG/ML SOLN COMPARISON:  August 01, 2021 FINDINGS: Lower chest: No acute abnormality. Hepatobiliary: There is diffuse fatty infiltration of the liver parenchyma. No focal liver abnormality is seen. No gallstones, gallbladder wall thickening, or biliary dilatation. Pancreas: Unremarkable. No pancreatic ductal dilatation or surrounding inflammatory changes. Spleen: Normal in size without focal abnormality. Adrenals/Urinary Tract: Adrenal glands are unremarkable. Kidneys are normal, without renal calculi, focal lesion, or hydronephrosis. Bladder is unremarkable. Stomach/Bowel: A small, stable left diaphragmatic hernia is seen which contains a small portion of the gastric fundus. Appendix appears normal. No evidence of bowel wall thickening, distention, or inflammatory changes. Vascular/Lymphatic: Aortic atherosclerosis. No enlarged abdominal or pelvic lymph nodes.  Reproductive: Uterus and bilateral adnexa are unremarkable. Other: No abdominal wall hernia or abnormality. No abdominopelvic ascites. Musculoskeletal: No acute or significant osseous findings. IMPRESSION: 1. N

## 2021-08-29 NOTE — TOC Transition Note (Signed)
Transition of Care (TOC) - CM/SW Discharge Note ? ? ?Patient Details  ?Name: [name] ?MRN: [id number] ?Date of Birth: Mar 04, 1971 ? ?Transition of Care (TOC) CM/SW Contact:  ?Tom-Johnson, Renea Ee, RN ?Phone Number: ?08/29/2021, 10:14 AM ? ? ?Clinical Narrative:    ? ?Patient is scheduled for discharge today. No TOC needs or recommendations noted. Denies any needs. Family to transport at discharge. No further TOC needs noted. ? ?  ?  ? ? ?Patient Goals and CMS Choice ?  ?  ?  ? ?Discharge Placement ?  ?           ?  ?  ?  ?  ? ?Discharge Plan and Services ?  ?  ?           ?  ?  ?  ?  ?  ?  ?  ?  ?  ?  ? ?Social Determinants of Health (SDOH) Interventions ?  ? ? ?Readmission Risk Interventions ? ?  05/12/2020  ? 12:39 PM  ?Readmission Risk Prevention Plan  ?Transportation Screening Complete  ?PCP or Specialist Appt within 3-5 Days Complete  ?[place] or Home Care Consult Complete  ?Social Work Consult for [place] Planning/Counseling Complete  ?Palliative Care Screening Not Applicable  ?Medication Review Press photographer) Complete  ? ? ? ? ? ?

## 2021-09-11 ENCOUNTER — Encounter: Payer: Medicare Other | Attending: Nurse Practitioner | Admitting: Nutrition

## 2021-09-11 DIAGNOSIS — I10 Essential (primary) hypertension: Secondary | ICD-10-CM | POA: Diagnosis not present

## 2021-09-11 DIAGNOSIS — E1165 Type 2 diabetes mellitus with hyperglycemia: Secondary | ICD-10-CM

## 2021-09-11 DIAGNOSIS — Z713 Dietary counseling and surveillance: Secondary | ICD-10-CM | POA: Insufficient documentation

## 2021-09-11 DIAGNOSIS — E119 Type 2 diabetes mellitus without complications: Secondary | ICD-10-CM | POA: Diagnosis not present

## 2021-09-12 NOTE — Patient Instructions (Addendum)
Make sure to have some carbohydrate and protein in every meal. Limit fats to one serving at each meal Eat 1/2 sub sandwich, and remove some of the bun Stop all sweet drinks, fruit juices and cold cereal and milk When ordering smoothie, make sure to use water in place of fruit juice and add 1 serving of protein powder Stop cheese on sandwiches,  Switch to baked chicken sandwiches Switch to low sodium can soups Start a walking program if ok ed by MD to work up to 30 minutes 4-5 days per week. Scan sensor before and 2hr. After meals, and write down what you had to eat if blood sugar reading goes high after meal. Return in 1 month

## 2021-09-12 NOTE — Progress Notes (Signed)
Patient is here today with her husband and son.  She is wanting more information on what to eat and diabetes in general.   Medications:  Meformin 500 mg qAM.   SBGM: AccuChek guide-testing once a day "after meals-1-3 hours after.  Blood sugars ranging from 220-140. Exercise:  none.  Likes to stay in bed a lot of the day Diet.  Says has changed her diet, since her diagnosis and has stopped a lot of the sugars in her diet. Average Day: 4-5AM: up Drinking diet coke 6AM:  Bfast: 1 carton of lite yogurt 10AM: pkge of 6 peanut butter crackers 12-1:  Lunch: 1 can of soup, and diet coke, or hot dog with cheese, and occasional baked chips, or smoothie 4-6PM: Supper:  always takeout.  12 inch sub, or egg plant parmigiana- (1/2 order), chick filA sandwich, or fish sandwich at [employer], no fries  or salmon with cocktail sauce, or corn dog , or 1 piece of thin crust pizza, no crust.  9PM: Bed.  Denies eating after supper, but will wake at 3AM and eat some of her supper that was not finished.  Says has lost weight, about 8 pounds in last 2 weeks, but wished to loose "a lot more". Discussion: The idea of insulin resistance and what 3 things she can do to reduce this: diet-weight loss,  exercise, and lower blood sugar readings Need for 3 basic food groups in each meal, and not too much of any one, that will result in high blood sugars resulting in higher blood sugars.  What foods are in each group and how much should be eaten at every meal. Need to limit amounts of fat in each meal, by reducing cheese  on sandwiches, cutting 12 inch sub in half, stopping fried foods like chicken and switching to baked chicken sandwich, and limiting dressings, amounts of mayo and oil in cooking.  Need to limit sodium to 1200 mg per day.--switch to lite sodium soups and do not add salt to foods.  Stop all foods with more than 300 mg of sodium per serving.  Need to monitor blood sugar readings, to determine which meals are raising  blood sugars.  She was given and trained on how to use a Libre 3,  and the readings were downloaded to her phone.  Goal for blood sugar readings:  Less than 120 before meals, and less than 160, 2hr. Pc.  She was very appreciative of this, because she does not like to test her blood sugars and says will scan sensor as directed ac and 2hr. Pc meals.   Need for exercise:  Stressed need to contact MD before starting a walking program- to start for 2 minutes 10 times per day and work up to 20-30 minutes as tolerated per day-no need to do this all at once.  May spit times as tolerated.  Will get same effect on insulin receptors.

## 2021-09-18 ENCOUNTER — Other Ambulatory Visit: Payer: Self-pay

## 2021-09-18 ENCOUNTER — Inpatient Hospital Stay (HOSPITAL_COMMUNITY)
Admission: EM | Admit: 2021-09-18 | Discharge: 2021-09-21 | DRG: 394 | Disposition: A | Discharge status: Home / Self Care | Payer: Medicare Other | Attending: Family Medicine | Admitting: Family Medicine

## 2021-09-18 ENCOUNTER — Emergency Department (HOSPITAL_COMMUNITY): Payer: Medicare Other

## 2021-09-18 ENCOUNTER — Encounter (HOSPITAL_COMMUNITY): Payer: Self-pay | Admitting: Internal Medicine

## 2021-09-18 DIAGNOSIS — G894 Chronic pain syndrome: Secondary | ICD-10-CM | POA: Diagnosis present

## 2021-09-18 DIAGNOSIS — F418 Other specified anxiety disorders: Secondary | ICD-10-CM | POA: Diagnosis present

## 2021-09-18 DIAGNOSIS — Z79899 Other long term (current) drug therapy: Secondary | ICD-10-CM

## 2021-09-18 DIAGNOSIS — E039 Hypothyroidism, unspecified: Secondary | ICD-10-CM | POA: Diagnosis present

## 2021-09-18 DIAGNOSIS — R1115 Cyclical vomiting syndrome unrelated to migraine: Secondary | ICD-10-CM | POA: Diagnosis not present

## 2021-09-18 DIAGNOSIS — F4310 Post-traumatic stress disorder, unspecified: Secondary | ICD-10-CM | POA: Diagnosis present

## 2021-09-18 DIAGNOSIS — E6609 Other obesity due to excess calories: Secondary | ICD-10-CM

## 2021-09-18 DIAGNOSIS — E119 Type 2 diabetes mellitus without complications: Secondary | ICD-10-CM | POA: Diagnosis present

## 2021-09-18 DIAGNOSIS — E785 Hyperlipidemia, unspecified: Secondary | ICD-10-CM | POA: Diagnosis present

## 2021-09-18 DIAGNOSIS — F32A Depression, unspecified: Secondary | ICD-10-CM | POA: Diagnosis present

## 2021-09-18 DIAGNOSIS — E870 Hyperosmolality and hypernatremia: Secondary | ICD-10-CM | POA: Diagnosis present

## 2021-09-18 DIAGNOSIS — I4711 Inappropriate sinus tachycardia, so stated: Secondary | ICD-10-CM | POA: Diagnosis present

## 2021-09-18 DIAGNOSIS — Z85828 Personal history of other malignant neoplasm of skin: Secondary | ICD-10-CM

## 2021-09-18 DIAGNOSIS — E66812 Obesity, class 2: Secondary | ICD-10-CM

## 2021-09-18 DIAGNOSIS — I10 Essential (primary) hypertension: Secondary | ICD-10-CM | POA: Diagnosis present

## 2021-09-18 DIAGNOSIS — R112 Nausea with vomiting, unspecified: Secondary | ICD-10-CM | POA: Diagnosis present

## 2021-09-18 DIAGNOSIS — Z833 Family history of diabetes mellitus: Secondary | ICD-10-CM

## 2021-09-18 DIAGNOSIS — N179 Acute kidney failure, unspecified: Secondary | ICD-10-CM | POA: Diagnosis present

## 2021-09-18 DIAGNOSIS — K2270 Barrett's esophagus without dysplasia: Secondary | ICD-10-CM | POA: Diagnosis present

## 2021-09-18 DIAGNOSIS — Z6837 Body mass index (BMI) 37.0-37.9, adult: Secondary | ICD-10-CM

## 2021-09-18 DIAGNOSIS — R000 Tachycardia, unspecified: Secondary | ICD-10-CM | POA: Diagnosis present

## 2021-09-18 DIAGNOSIS — D72829 Elevated white blood cell count, unspecified: Secondary | ICD-10-CM | POA: Diagnosis present

## 2021-09-18 DIAGNOSIS — R1084 Generalized abdominal pain: Secondary | ICD-10-CM

## 2021-09-18 DIAGNOSIS — E876 Hypokalemia: Secondary | ICD-10-CM | POA: Diagnosis present

## 2021-09-18 DIAGNOSIS — F419 Anxiety disorder, unspecified: Secondary | ICD-10-CM | POA: Diagnosis present

## 2021-09-18 LAB — URINALYSIS, ROUTINE W REFLEX MICROSCOPIC
Bilirubin Urine: NEGATIVE
Glucose, UA: NEGATIVE mg/dL
Hgb urine dipstick: NEGATIVE
Ketones, ur: 20 mg/dL — AB
Nitrite: NEGATIVE
Protein, ur: 100 mg/dL — AB
Specific Gravity, Urine: 1.024 (ref 1.005–1.030)
pH: 5 (ref 5.0–8.0)

## 2021-09-18 LAB — COMPREHENSIVE METABOLIC PANEL
ALT: 48 U/L — ABNORMAL HIGH (ref 0–44)
AST: 62 U/L — ABNORMAL HIGH (ref 15–41)
Albumin: 4.1 g/dL (ref 3.5–5.0)
Alkaline Phosphatase: 99 U/L (ref 38–126)
Anion gap: 24 — ABNORMAL HIGH (ref 5–15)
BUN: <5 mg/dL — ABNORMAL LOW (ref 6–20)
CO2: 16 mmol/L — ABNORMAL LOW (ref 22–32)
Calcium: 10.1 mg/dL (ref 8.9–10.3)
Chloride: 106 mmol/L (ref 98–111)
Creatinine, Ser: 1.24 mg/dL — ABNORMAL HIGH (ref 0.44–1.00)
GFR, Estimated: 53 mL/min — ABNORMAL LOW (ref >60)
Glucose, Bld: 222 mg/dL — ABNORMAL HIGH (ref 70–99)
Potassium: 4.3 mmol/L (ref 3.5–5.1)
Sodium: 146 mmol/L — ABNORMAL HIGH (ref 135–145)
Total Bilirubin: 0.8 mg/dL (ref 0.3–1.2)
Total Protein: 8.7 g/dL — ABNORMAL HIGH (ref 6.5–8.1)

## 2021-09-18 LAB — CBC
HCT: 55.5 % — ABNORMAL HIGH (ref 36.0–46.0)
Hemoglobin: 18 g/dL — ABNORMAL HIGH (ref 12.0–15.0)
MCH: 30.3 pg (ref 26.0–34.0)
MCHC: 32.4 g/dL (ref 30.0–36.0)
MCV: 93.4 fL (ref 80.0–100.0)
Platelets: 457 10*3/uL — ABNORMAL HIGH (ref 150–400)
RBC: 5.94 MIL/uL — ABNORMAL HIGH (ref 3.87–5.11)
RDW: 13.4 % (ref 11.5–15.5)
WBC: 13 10*3/uL — ABNORMAL HIGH (ref 4.0–10.5)
nRBC: 0 % (ref 0.0–0.2)

## 2021-09-18 LAB — BASIC METABOLIC PANEL
Anion gap: 17 — ABNORMAL HIGH (ref 5–15)
BUN: <5 mg/dL — ABNORMAL LOW (ref 6–20)
CO2: 19 mmol/L — ABNORMAL LOW (ref 22–32)
Calcium: 9 mg/dL (ref 8.9–10.3)
Chloride: 110 mmol/L (ref 98–111)
Creatinine, Ser: 1.01 mg/dL — ABNORMAL HIGH (ref 0.44–1.00)
GFR, Estimated: >60 mL/min (ref >60)
Glucose, Bld: 183 mg/dL — ABNORMAL HIGH (ref 70–99)
Potassium: 3.1 mmol/L — ABNORMAL LOW (ref 3.5–5.1)
Sodium: 146 mmol/L — ABNORMAL HIGH (ref 135–145)

## 2021-09-18 LAB — CBG MONITORING, ED: Glucose-Capillary: 157 mg/dL — ABNORMAL HIGH (ref 70–99)

## 2021-09-18 LAB — GLUCOSE, CAPILLARY
Glucose-Capillary: 180 mg/dL — ABNORMAL HIGH (ref 70–99)
Glucose-Capillary: 155 mg/dL — ABNORMAL HIGH (ref 70–99)

## 2021-09-18 LAB — LIPASE, BLOOD: Lipase: 19 U/L (ref 11–51)

## 2021-09-18 MED ORDER — ONDANSETRON HCL 4 MG/2ML IJ SOLN: 4.0000 mg | Freq: Once | INTRAMUSCULAR | Status: DC

## 2021-09-18 MED ORDER — ACETAMINOPHEN 325 MG PO TABS
650.0000 mg | ORAL_TABLET | Freq: Four times a day (QID) | ORAL | Status: DC | PRN
  Administered 2021-09-18: 650 mg via ORAL
  Filled 2021-09-18: qty 2

## 2021-09-18 MED ORDER — ARIPIPRAZOLE 10 MG PO TABS
5.0000 mg | ORAL_TABLET | Freq: Every day | ORAL | Status: DC
  Administered 2021-09-18 – 2021-09-21 (×4): 5 mg via ORAL
  Filled 2021-09-18 (×4): qty 1

## 2021-09-18 MED ORDER — IOHEXOL 300 MG/ML  SOLN
100.0000 mL | Freq: Once | INTRAMUSCULAR | Status: AC | PRN
  Administered 2021-09-18: 100 mL via INTRAVENOUS

## 2021-09-18 MED ORDER — CAPSAICIN 0.075 % EX CREA
TOPICAL_CREAM | Freq: Two times a day (BID) | CUTANEOUS | Status: DC
Start: 2021-09-18 — End: 2021-09-18
  Filled 2021-09-18: qty 60

## 2021-09-18 MED ORDER — ATORVASTATIN CALCIUM 10 MG PO TABS
10.0000 mg | ORAL_TABLET | Freq: Every day | ORAL | Status: DC
  Administered 2021-09-18 – 2021-09-21 (×4): 10 mg via ORAL
  Filled 2021-09-18 (×4): qty 1

## 2021-09-18 MED ORDER — SODIUM CHLORIDE 0.9% FLUSH
3.0000 mL | Freq: Two times a day (BID) | INTRAVENOUS | Status: DC
  Administered 2021-09-18 – 2021-09-20 (×5): 3 mL via INTRAVENOUS

## 2021-09-18 MED ORDER — BISACODYL 5 MG PO TBEC: 5.0000 mg | DELAYED_RELEASE_TABLET | Freq: Every day | ORAL | Status: DC | PRN

## 2021-09-18 MED ORDER — METOCLOPRAMIDE HCL 5 MG/ML IJ SOLN
5.0000 mg | Freq: Three times a day (TID) | INTRAMUSCULAR | Status: DC
  Administered 2021-09-18 – 2021-09-21 (×9): 5 mg via INTRAVENOUS
  Filled 2021-09-18 (×9): qty 2

## 2021-09-18 MED ORDER — PANTOPRAZOLE SODIUM 40 MG IV SOLR
40.0000 mg | INTRAVENOUS | Status: DC
  Administered 2021-09-18 – 2021-09-20 (×3): 40 mg via INTRAVENOUS
  Filled 2021-09-18 (×3): qty 10

## 2021-09-18 MED ORDER — ONDANSETRON HCL 4 MG/2ML IJ SOLN
INTRAMUSCULAR | Status: AC
  Administered 2021-09-18: 4 mg via INTRAVENOUS
  Filled 2021-09-18: qty 2

## 2021-09-18 MED ORDER — ENOXAPARIN SODIUM 40 MG/0.4ML IJ SOSY
40.0000 mg | PREFILLED_SYRINGE | INTRAMUSCULAR | Status: DC
  Administered 2021-09-18 – 2021-09-20 (×3): 40 mg via SUBCUTANEOUS
  Filled 2021-09-18 (×3): qty 0.4

## 2021-09-18 MED ORDER — CAPSAICIN 0.025 % EX CREA
TOPICAL_CREAM | Freq: Two times a day (BID) | CUTANEOUS | Status: DC
  Filled 2021-09-18: qty 60

## 2021-09-18 MED ORDER — LEVOTHYROXINE SODIUM 25 MCG PO TABS
50.0000 ug | ORAL_TABLET | Freq: Every day | ORAL | Status: DC
  Administered 2021-09-19 – 2021-09-21 (×3): 50 ug via ORAL
  Filled 2021-09-18 (×3): qty 2

## 2021-09-18 MED ORDER — OXYCODONE-ACETAMINOPHEN 7.5-325 MG PO TABS
1.0000 | ORAL_TABLET | Freq: Every day | ORAL | Status: DC
  Administered 2021-09-18 – 2021-09-20 (×9): 2 via ORAL
  Administered 2021-09-20 (×2): 1 via ORAL
  Administered 2021-09-20 – 2021-09-21 (×4): 2 via ORAL
  Filled 2021-09-18 (×2): qty 2
  Filled 2021-09-18: qty 1
  Filled 2021-09-18 (×9): qty 2
  Filled 2021-09-18: qty 1
  Filled 2021-09-18 (×2): qty 2

## 2021-09-18 MED ORDER — POLYETHYLENE GLYCOL 3350 17 G PO PACK: 17.0000 g | PACK | Freq: Every day | ORAL | Status: DC | PRN

## 2021-09-18 MED ORDER — ONDANSETRON HCL 4 MG PO TABS: 4.0000 mg | ORAL_TABLET | Freq: Four times a day (QID) | ORAL | Status: DC | PRN

## 2021-09-18 MED ORDER — TRAZODONE HCL 50 MG PO TABS
150.0000 mg | ORAL_TABLET | Freq: Two times a day (BID) | ORAL | Status: DC
  Administered 2021-09-18 – 2021-09-19 (×4): 150 mg via ORAL
  Filled 2021-09-18 (×5): qty 3

## 2021-09-18 MED ORDER — ONDANSETRON HCL 4 MG/2ML IJ SOLN
4.0000 mg | Freq: Four times a day (QID) | INTRAMUSCULAR | Status: DC | PRN
  Administered 2021-09-18: 4 mg via INTRAVENOUS
  Filled 2021-09-18: qty 2

## 2021-09-18 MED ORDER — INSULIN ASPART 100 UNIT/ML IJ SOLN: 0.0000 [IU] | Freq: Every day | INTRAMUSCULAR | Status: DC

## 2021-09-18 MED ORDER — LACTATED RINGERS IV SOLN: INTRAVENOUS | Status: DC

## 2021-09-18 MED ORDER — METOCLOPRAMIDE HCL 5 MG/ML IJ SOLN
10.0000 mg | Freq: Once | INTRAMUSCULAR | Status: AC
  Administered 2021-09-18: 10 mg via INTRAVENOUS
  Filled 2021-09-18: qty 2

## 2021-09-18 MED ORDER — ONDANSETRON HCL 4 MG/2ML IJ SOLN
4.0000 mg | Freq: Once | INTRAMUSCULAR | Status: AC
Start: 2021-09-18 — End: 2021-09-18
  Administered 2021-09-18: 4 mg via INTRAVENOUS
  Filled 2021-09-18: qty 2

## 2021-09-18 MED ORDER — CARVEDILOL 3.125 MG PO TABS
6.2500 mg | ORAL_TABLET | Freq: Two times a day (BID) | ORAL | Status: DC
  Administered 2021-09-18 – 2021-09-21 (×6): 6.25 mg via ORAL
  Filled 2021-09-18 (×7): qty 2

## 2021-09-18 MED ORDER — FENTANYL CITRATE PF 50 MCG/ML IJ SOSY
25.0000 ug | PREFILLED_SYRINGE | Freq: Once | INTRAMUSCULAR | Status: AC
  Administered 2021-09-18: 25 ug via INTRAVENOUS
  Filled 2021-09-18: qty 1

## 2021-09-18 MED ORDER — ACETAMINOPHEN 650 MG RE SUPP: 650.0000 mg | Freq: Four times a day (QID) | RECTAL | Status: DC | PRN

## 2021-09-18 MED ORDER — PROMETHAZINE HCL 25 MG RE SUPP
25.0000 mg | Freq: Four times a day (QID) | RECTAL | Status: DC | PRN
Start: 2021-09-18 — End: 2021-09-21
  Administered 2021-09-18: 25 mg via RECTAL
  Filled 2021-09-18 (×2): qty 1

## 2021-09-18 MED ORDER — FLUOXETINE HCL 20 MG PO CAPS
40.0000 mg | ORAL_CAPSULE | Freq: Every day | ORAL | Status: DC
  Administered 2021-09-18 – 2021-09-21 (×4): 40 mg via ORAL
  Filled 2021-09-18 (×5): qty 2

## 2021-09-18 MED ORDER — INSULIN ASPART 100 UNIT/ML IJ SOLN
0.0000 [IU] | Freq: Three times a day (TID) | INTRAMUSCULAR | Status: DC
  Administered 2021-09-18: 3 [IU] via SUBCUTANEOUS
  Administered 2021-09-20: 2 [IU] via SUBCUTANEOUS

## 2021-09-18 MED ORDER — SODIUM CHLORIDE 0.9 % IV BOLUS
1000.0000 mL | Freq: Once | INTRAVENOUS | Status: AC
  Administered 2021-09-18: 1000 mL via INTRAVENOUS

## 2021-09-18 MED ORDER — PRAZOSIN HCL 2 MG PO CAPS
2.0000 mg | ORAL_CAPSULE | Freq: Every day | ORAL | Status: DC
Start: 2021-09-18 — End: 2021-09-21
  Administered 2021-09-18 – 2021-09-20 (×3): 2 mg via ORAL
  Filled 2021-09-18 (×5): qty 1

## 2021-09-18 MED ORDER — HYDRALAZINE HCL 20 MG/ML IJ SOLN
5.0000 mg | INTRAMUSCULAR | Status: DC | PRN
  Administered 2021-09-18: 5 mg via INTRAVENOUS
  Filled 2021-09-18: qty 1

## 2021-09-18 NOTE — ED Provider Notes (Addendum)
[facility]PART[facility] Provider Note   CSN: [id number] Arrival date & time: 09/18/21  2778     History Chief Complaint  Patient presents with   Abdominal Pain   Emesis    [name] is a 51 y.o. female with history of hypertension, hyperlipidemia, PTSD, Type II DM, chronic low back pain on opioids who presents to the emergency department with intractable nausea and vomiting and diffuse abdominal pain that started yesterday.  Nothing seems to make this better or worse.  She has been unable to take her pain medications secondary to vomiting.  She denies any urinary complaints, shortness of breath, fever, chills, diarrhea.  On chart review, patient was recently discharged from the hospital on 08/29/2021 after being mated for similar symptoms.  Was thought to be viral gastroenteritis at that time.  She was feeling well at discharge.  I also reviewed the labs from the ED visit which there could be questionable DKA developing. A1C was 7.1% in April. CT abdomen pelvis with contrast was negative for any acute abnormalities at that time.   Abdominal Pain Associated symptoms: vomiting   Emesis Associated symptoms: abdominal pain       Home Medications Prior to Admission medications   Medication Sig Start Date End Date Taking? Authorizing Provider  acetaminophen (TYLENOL) 325 MG tablet Take 2 tablets (650 mg total) by mouth every 6 (six) hours as needed for mild pain, fever or headache. Patient taking differently: Take 650 mg by mouth 2 (two) times daily as needed for mild pain, fever or headache. 07/01/20   Angiulli, Lavon Paganini, PA-C  ARIPiprazole (ABILIFY) 5 MG tablet Take 1 tablet (5 mg total) by mouth daily. 08/01/20   Jamse Arn, MD  atorvastatin (LIPITOR) 10 MG tablet Take 10 mg by mouth daily. 08/26/20   [provider]  blood glucose meter kit and supplies KIT Dispense based on patient and insurance preference. Use up to four times daily as  directed. 08/03/21   Antonieta Pert, MD  carvedilol (COREG) 6.25 MG tablet Take 1 tablet (6.25 mg total) by mouth 2 (two) times daily with a meal. 08/01/20   Patel, Domenick Bookbinder, MD  diphenhydrAMINE HCl (BENADRYL PO) Take 3 tablets by mouth at bedtime as needed (sleep).    [provider]  EUTHYROX 25 MCG tablet Take 25 mcg by mouth every morning. 08/26/20   [provider]  FLUoxetine (PROZAC) 40 MG capsule Take 1 capsule (40 mg total) by mouth daily. 07/01/20   Angiulli, Lavon Paganini, PA-C  multivitamin (RENA-VIT) TABS tablet Take 1 tablet by mouth at bedtime. 08/01/20   Jamse Arn, MD  omeprazole (PRILOSEC) 20 MG capsule Take 1 capsule (20 mg total) by mouth daily. 08/01/20   Jamse Arn, MD  ondansetron (ZOFRAN-ODT) 4 MG disintegrating tablet Take 1 tablet (4 mg total) by mouth every 8 (eight) hours as needed for nausea or vomiting. 08/29/21   Darliss Cheney, MD  ondansetron (ZOFRAN-ODT) 4 MG disintegrating tablet Take 1 tablet (4 mg total) by mouth every 8 (eight) hours as needed for nausea or vomiting. 08/29/21   Darliss Cheney, MD  oxyCODONE-acetaminophen (PERCOCET) 7.5-325 MG tablet Take 1 tablet by mouth See admin instructions. Take 7.5 - 325 mg 5 times daily    [provider]  prazosin (MINIPRESS) 2 MG capsule Take 1 capsule (2 mg total) by mouth at bedtime. 08/01/20   Jamse Arn, MD  Simethicone (GAS-X PO) Take 2 tablets by mouth daily  as needed (gas).    [provider]  tiZANidine (ZANAFLEX) 4 MG capsule Take 4 mg by mouth in the morning, at noon, in the evening, and at bedtime.    [provider]  traZODone (DESYREL) 50 MG tablet Take 50 mg by mouth in the morning and at bedtime. 10/28/20   [provider]      Allergies    Patient has no known allergies.    Review of Systems   Review of Systems  Gastrointestinal:  Positive for abdominal pain and vomiting.  All other systems reviewed and are negative.  Physical Exam Updated Vital  Signs BP (!) 164/138   Pulse 95   Temp 98.7 F (37.1 C) (Oral)   Resp 20   LMP 06/03/2018 (Approximate) Comment: neg hcg 05/10/20  SpO2 99%  Physical Exam Vitals and nursing note reviewed.  Constitutional:      General: She is not in acute distress.    Appearance: Normal appearance.  HENT:     Head: Normocephalic and atraumatic.  Eyes:     General:        Right eye: No discharge.        Left eye: No discharge.  Cardiovascular:     Rate and Rhythm: Tachycardia present.     Pulses: Normal pulses.     Heart sounds: Normal heart sounds.     Comments: Tachycardic in the 130s to 150s. Pulmonary:     Comments: Clear to auscultation bilaterally.  Normal effort.  No respiratory distress.  No evidence of wheezes, rales, or rhonchi heard throughout. Abdominal:     General: Abdomen is flat. Bowel sounds are normal. There is no distension.     Tenderness: There is generalized abdominal tenderness. There is no guarding or rebound.  Musculoskeletal:        General: Normal range of motion.     Cervical back: Neck supple.  Skin:    General: Skin is warm and dry.     Findings: No rash.  Neurological:     General: No focal deficit present.     Mental Status: She is alert.  Psychiatric:        Mood and Affect: Mood normal.        Behavior: Behavior normal.    ED Results / Procedures / Treatments   Labs (all labs ordered are listed, but only abnormal results are displayed) Labs Reviewed  COMPREHENSIVE METABOLIC PANEL - Abnormal; Notable for the following components:      Result Value   Sodium 146 (*)    CO2 16 (*)    Glucose, Bld 222 (*)    BUN <5 (*)    Creatinine, Ser 1.24 (*)    Total Protein 8.7 (*)    AST 62 (*)    ALT 48 (*)    GFR, Estimated 53 (*)    Anion gap 24 (*)    All other components within normal limits  CBC - Abnormal; Notable for the following components:   WBC 13.0 (*)    RBC 5.94 (*)    Hemoglobin 18.0 (*)    HCT 55.5 (*)    Platelets 457 (*)    All  other components within normal limits  URINALYSIS, ROUTINE W REFLEX MICROSCOPIC - Abnormal; Notable for the following components:   Color, Urine AMBER (*)    APPearance HAZY (*)    Ketones, ur 20 (*)    Protein, ur 100 (*)    Leukocytes,Ua MODERATE (*)  Bacteria, UA FEW (*)    All other components within normal limits  LIPASE, BLOOD  CBG MONITORING, ED    EKG EKG Interpretation  Date/Time:  Monday Sep 18 2021 08:33:04 EDT Ventricular Rate:  125 PR Interval:  112 QRS Duration: 84 QT Interval:  301 QTC Calculation: 434 R Axis:   7 Text Interpretation: Sinus tachycardia unchanged from prior EKGs Confirmed by Lennice Sites ([id number]) on 09/18/2021 8:35:19 AM  Radiology CT ABDOMEN PELVIS W CONTRAST  Result Date: 09/18/2021 CLINICAL DATA:  Diffuse abdominal pain. EXAM: CT ABDOMEN AND PELVIS WITH CONTRAST TECHNIQUE: Multidetector CT imaging of the abdomen and pelvis was performed using the standard protocol following bolus administration of intravenous contrast. RADIATION DOSE REDUCTION: This exam was performed according to the departmental dose-optimization program which includes automated exposure control, adjustment of the mA and/or kV according to patient size and/or use of iterative reconstruction technique. CONTRAST:  137m OMNIPAQUE IOHEXOL 300 MG/ML  SOLN COMPARISON:  08/27/2021. FINDINGS: Lower chest: Clear lung bases. Hepatobiliary: Decreased liver attenuation consistent with fatty infiltration. Normal liver size. No mass or focal lesion. Normal gallbladder. No bile duct dilation. Pancreas: Unremarkable. No pancreatic ductal dilatation or surrounding inflammatory changes. Spleen: Normal in size without focal abnormality. Adrenals/Urinary Tract: Adrenal glands are unremarkable. Kidneys are normal, without renal calculi, focal lesion, or hydronephrosis. Bladder is unremarkable. Stomach/Bowel: Stomach is within normal limits. Appendix appears normal. No evidence of bowel wall thickening,  distention, or inflammatory changes. Vascular/Lymphatic: Minimal aortic atherosclerosis. No aneurysm. Vasculature otherwise unremarkable. No enlarged lymph nodes. Reproductive: Uterus and bilateral adnexa are unremarkable. Other: No abdominal wall hernia or abnormality. No abdominopelvic ascites. Musculoskeletal: No fracture or acute finding.  No bone lesion. IMPRESSION: 1. No acute findings within the abdomen or pelvis. Stable appearance from the prior abdomen and pelvis CT. 2. Hepatic steatosis. 3. Minimal aortic atherosclerosis. Electronically Signed   By: DLajean ManesM.D.   On: 09/18/2021 10:25   DG Chest Portable 1 View  Result Date: 09/18/2021 CLINICAL DATA:  Chest pain EXAM: PORTABLE CHEST 1 VIEW COMPARISON:  Radiograph 06/11/2020 FINDINGS: The cardiomediastinal silhouette is within normal limits. There is no focal airspace consolidation. There is no pleural effusion. No pneumothorax. There is no acute osseous abnormality. IMPRESSION: No evidence of acute cardiopulmonary disease. Electronically Signed   By: JMaurine SimmeringM.D.   On: 09/18/2021 09:11    Procedures .Critical Care Performed by: FHendricks Limes PA-C Authorized by: FHendricks Limes PA-C   Critical care provider statement:    Critical care time (minutes):  35   Critical care time was exclusive of:  Separately billable procedures and treating other patients   Critical care was necessary to treat or prevent imminent or life-threatening deterioration of the following conditions:  Circulatory failure   Critical care was time spent personally by me on the following activities:  Ordering and performing treatments and interventions, ordering and review of laboratory studies, ordering and review of radiographic studies, re-evaluation of patient's condition, blood draw for specimens, discussions with consultants and examination of patient    Medications Ordered in ED Medications  fentaNYL (SUBLIMAZE) injection 25 mcg (25 mcg  Intravenous Given 09/18/21 0854)  sodium chloride 0.9 % bolus 1,000 mL (0 mLs Intravenous Stopped 09/18/21 1012)  metoCLOPramide (REGLAN) injection 10 mg (10 mg Intravenous Given 09/18/21 0856)  fentaNYL (SUBLIMAZE) injection 25 mcg (25 mcg Intravenous Given 09/18/21 0945)  iohexol (OMNIPAQUE) 300 MG/ML solution 100 mL (100 mLs Intravenous Contrast Given 09/18/21 1015)  ondansetron (ZOFRAN) injection 4  mg (4 mg Intravenous Given 09/18/21 1102)    ED Course/ Medical Decision Making/ A&P Clinical Course as of 09/18/21 1117  Mon Sep 18, 2021  1042 CBC(!) Evidence of leukocytosis, elevated hemoglobin, and thrombocytosis.  This is likely secondary to dehydration. [CF]  1043 Lipase, blood Lipase normal.  I doubt pancreatitis at this time. [CF]  1043 Comprehensive metabolic panel(!) Patient is sodium is slightly above normal.  Elevated creatinine in comparison to baseline.  Does meet AKI criteria likely prerenal.  She has some evidence of transaminitis as well. [CF]  1043 Urinalysis, Routine w reflex microscopic Urine, Clean Catch(!) Patient does have significant pyuria with negative nitrates.  She is not having any urinary complaints however and I will defer antibiotics at this time. [CF]  6195 On reevaluation, patient still having abdominal pain and some episodes of vomiting.  We will give her some Zofran. [CF]  1113 I spoke with Dr. [name] with [facility] who agrees to admit the patient. [CF]    Clinical Course User Index [CF] Hendricks Limes, PA-C                           Medical Decision Making [name] is a 51 y.o. female who presents to the emergency department today with intractable nausea and vomiting and abdominal pain.  Differential diagnosis includes GERD, pancreatitis, hyperglycemia, appendicitis.   Amount and/or Complexity of Data Reviewed External Data Reviewed: labs, radiology and notes.    Details: See HPI for further details Labs: ordered. Decision-making details  documented in ED Course. Radiology: ordered and independent interpretation performed.    Details: Person ordered and interpreted imaging to include chest x-ray and CT abdomen pelvis with contrast.  Chest x-ray did not show any acute abnormalities.  CT abdomen pelvis with contrast did not reveal any signs of pancreatitis or appendicitis.  Do agree with the radiology interpretation.  Risk Prescription drug management. Parenteral controlled substances. Decision regarding hospitalization. Risk Details: Given the clinical scenario, patient still having vomiting despite getting multiple rounds of antiemetics and pain medication.  In addition, patient does have evidence of an AKI.  Patient's tachycardia did improve with fluids.  However, as the patient is unable to tolerate p.o. at this time and with the AKI I do feel the patient would likely benefit from further evaluation and management in the hospital.  I will work to get her admitted with [facility] service.   Final Clinical Impression(s) / ED Diagnoses Final diagnoses:  Nausea and vomiting, unspecified vomiting type  Generalized abdominal pain    Rx / DC Orders ED Discharge Orders     None         Hendricks Limes, PA-C 09/18/21 1117    Myna Bright M, PA-C 09/18/21 [place], [place], DO 09/18/21 1537

## 2021-09-18 NOTE — Plan of Care (Signed)
  Problem: Nutrition: Goal: Adequate nutrition will be maintained 09/18/2021 2008 by Derrill Kay, RN Outcome: Progressing 09/18/2021 1347 by Derrill Kay, RN Outcome: Not Progressing 09/18/2021 1346 by Samule Dry A, RN Outcome: Progressing   Problem: Pain Managment: Goal: General experience of comfort will improve 09/18/2021 2008 by Derrill Kay, RN Outcome: Progressing 09/18/2021 1347 by Derrill Kay, RN Outcome: Progressing 09/18/2021 1346 by Samule Dry A, RN Outcome: Progressing 09/18/2021 1346 by Mohamed-Medani, Noura Purpura A, RN Outcome: Progressing   Problem: Safety: Goal: Ability to remain free from injury will improve 09/18/2021 2008 by Derrill Kay, RN Outcome: Progressing 09/18/2021 1346 by Samule Dry A, RN Outcome: Progressing 09/18/2021 1346 by Samule Dry A, RN Outcome: Progressing

## 2021-09-18 NOTE — Consult Note (Signed)
CONSULT NOTE FOR [facility]  Reason for Consult:Nausea/vomiting and abdominal pain Referring Physician: [facility]  Abby Potash HPI: This is a 51 year old female with a PMH of presumed Cyclic Vomiting Syndrome, anxiety, history of suicide attempts, HTN, IDA with a distal small bowel AVM, Barrett's esophagus, cardiac arrest 06/08/2020, and suboptimal control of her DM (A1C 7.2% 08/01/2021)/  Her husband states that her symptoms started acutely.  Over a years time she will have 12 bouts of the nausea and vomiting that will has 4 days.  It was not clear if she truly was asymptomatic during the intercurrent periods between the nausea/vomiting events.  Per the patient and her husband, if anything is not right, ie, if she does not have her meds just right or if she is stressed, these issues will bring about her nausea, vomiting, and abdominal pain.  She does not have a history of migraines and she does not use marijuana.  Past Medical History:  Diagnosis Date   Anxiety    Arthritis    "joints ache all over" (10/15/2014)   Barrett's esophagus    Bulging lumbar disc    Chronic lower back pain    DDD (degenerative disc disease), cervical    Depression    Drug-seeking behavior    Headache    "weekly" (10/15/2014)   Hyperlipemia    Hypertension    PTSD (post-traumatic stress disorder)    Skin cancer    "had them cut off my arms; don't know what kind"    Past Surgical History:  Procedure Laterality Date   ABLATION ON ENDOMETRIOSIS  2008   BIOPSY  12/27/2018   Procedure: BIOPSY;  Surgeon: Thornton Park, MD;  Location: [facility];  Service: Gastroenterology;;   ESOPHAGOGASTRODUODENOSCOPY (EGD) WITH PROPOFOL N/A 12/27/2018   Procedure: ESOPHAGOGASTRODUODENOSCOPY (EGD) WITH PROPOFOL;  Surgeon: Thornton Park, MD;  Location: [facility];  Service: Gastroenterology;  Laterality: N/A;   HEMORRHOID SURGERY  ~ 2002   IR FLUORO GUIDE CV LINE RIGHT  06/14/2020   IR REMOVAL TUN CV CATH W/O  FL  06/23/2020   IR US GUIDE VASC ACCESS RIGHT  06/14/2020   ORIF ANKLE FRACTURE Right 03/28/2020   Procedure: OPEN REDUCTION INTERNAL FIXATION (ORIF) RIGHT BIMALLEOLAR ANKLE FRACTURE;  Surgeon: Marchia Bond, MD;  Location: [facility];  Service: Orthopedics;  Laterality: Right;    Family History  Problem Relation Age of Onset   Breast cancer Mother    Diabetes Mother    Breast cancer Maternal Grandmother    Breast cancer Paternal Grandmother    Colon polyps Paternal Grandmother    Colon cancer Neg Hx    Esophageal cancer Neg Hx    Rectal cancer Neg Hx    Stomach cancer Neg Hx     Social History:  reports that she has never smoked. She has never used smokeless tobacco. She reports that she does not currently use alcohol. She reports that she does not use drugs.  Allergies: No Known Allergies  Medications: Scheduled:  ARIPiprazole  5 mg Oral Daily   atorvastatin  10 mg Oral Daily   capsaicin   Topical BID   carvedilol  6.25 mg Oral BID WC   enoxaparin (LOVENOX) injection  40 mg Subcutaneous Q24H   FLUoxetine  40 mg Oral Daily   insulin aspart  0-15 Units Subcutaneous TID WC   insulin aspart  0-5 Units Subcutaneous QHS   [START ON 09/19/2021] levothyroxine  50 mcg Oral Daily   metoCLOPramide (REGLAN)  injection  5 mg Intravenous Q8H   oxyCODONE-acetaminophen  1-2 tablet Oral 5 X Daily   pantoprazole (PROTONIX) IV  40 mg Intravenous Q24H   prazosin  2 mg Oral QHS   sodium chloride flush  3 mL Intravenous Q12H   traZODone  150 mg Oral BID   Continuous:  lactated ringers 100 mL/hr at 09/18/21 1317    Results for orders placed or performed during the hospital encounter of 09/18/21 (from the past 24 hour(s))  Urinalysis, Routine w reflex microscopic Urine, Clean Catch     Status: Abnormal   Collection Time: 09/18/21  8:13 AM  Result Value Ref Range   Color, Urine AMBER (A) YELLOW   APPearance HAZY (A) CLEAR   Specific Gravity, Urine 1.024 1.005 - 1.030   pH 5.0  5.0 - 8.0   Glucose, UA NEGATIVE NEGATIVE mg/dL   Hgb urine dipstick NEGATIVE NEGATIVE   Bilirubin Urine NEGATIVE NEGATIVE   Ketones, ur 20 (A) NEGATIVE mg/dL   Protein, ur 100 (A) NEGATIVE mg/dL   Nitrite NEGATIVE NEGATIVE   Leukocytes,Ua MODERATE (A) NEGATIVE   RBC / HPF 6-10 0 - 5 RBC/hpf   WBC, UA 11-20 0 - 5 WBC/hpf   Bacteria, UA FEW (A) NONE SEEN   Squamous Epithelial / LPF 0-5 0 - 5   Mucus PRESENT    Hyaline Casts, UA PRESENT   Lipase, blood     Status: None   Collection Time: 09/18/21  8:20 AM  Result Value Ref Range   Lipase 19 11 - 51 U/L  Comprehensive metabolic panel     Status: Abnormal   Collection Time: 09/18/21  8:20 AM  Result Value Ref Range   Sodium 146 (H) 135 - 145 mmol/L   Potassium 4.3 3.5 - 5.1 mmol/L   Chloride 106 98 - 111 mmol/L   CO2 16 (L) 22 - 32 mmol/L   Glucose, Bld 222 (H) 70 - 99 mg/dL   BUN <5 (L) 6 - 20 mg/dL   Creatinine, Ser 1.24 (H) 0.44 - 1.00 mg/dL   Calcium 10.1 8.9 - 10.3 mg/dL   Total Protein 8.7 (H) 6.5 - 8.1 g/dL   Albumin 4.1 3.5 - 5.0 g/dL   AST 62 (H) 15 - 41 U/L   ALT 48 (H) 0 - 44 U/L   Alkaline Phosphatase 99 38 - 126 U/L   Total Bilirubin 0.8 0.3 - 1.2 mg/dL   GFR, Estimated 53 (L) >60 mL/min   Anion gap 24 (H) 5 - 15  CBC     Status: Abnormal   Collection Time: 09/18/21  8:20 AM  Result Value Ref Range   WBC 13.0 (H) 4.0 - 10.5 K/uL   RBC 5.94 (H) 3.87 - 5.11 MIL/uL   Hemoglobin 18.0 (H) 12.0 - 15.0 g/dL   HCT 55.5 (H) 36.0 - 46.0 %   MCV 93.4 80.0 - 100.0 fL   MCH 30.3 26.0 - 34.0 pg   MCHC 32.4 30.0 - 36.0 g/dL   RDW 13.4 11.5 - 15.5 %   Platelets 457 (H) 150 - 400 K/uL   nRBC 0.0 0.0 - 0.2 %  POC CBG, ED     Status: Abnormal   Collection Time: 09/18/21 11:17 AM  Result Value Ref Range   Glucose-Capillary 157 (H) 70 - 99 mg/dL   Comment 1 Notify RN    Comment 2 Document in Chart      CT ABDOMEN PELVIS W CONTRAST  Result Date: 09/18/2021 CLINICAL DATA:  Diffuse abdominal pain. EXAM: CT ABDOMEN AND  PELVIS WITH CONTRAST TECHNIQUE: Multidetector CT imaging of the abdomen and pelvis was performed using the standard protocol following bolus administration of intravenous contrast. RADIATION DOSE REDUCTION: This exam was performed according to the departmental dose-optimization program which includes automated exposure control, adjustment of the mA and/or kV according to patient size and/or use of iterative reconstruction technique. CONTRAST:  154m OMNIPAQUE IOHEXOL 300 MG/ML  SOLN COMPARISON:  08/27/2021. FINDINGS: Lower chest: Clear lung bases. Hepatobiliary: Decreased liver attenuation consistent with fatty infiltration. Normal liver size. No mass or focal lesion. Normal gallbladder. No bile duct dilation. Pancreas: Unremarkable. No pancreatic ductal dilatation or surrounding inflammatory changes. Spleen: Normal in size without focal abnormality. Adrenals/Urinary Tract: Adrenal glands are unremarkable. Kidneys are normal, without renal calculi, focal lesion, or hydronephrosis. Bladder is unremarkable. Stomach/Bowel: Stomach is within normal limits. Appendix appears normal. No evidence of bowel wall thickening, distention, or inflammatory changes. Vascular/Lymphatic: Minimal aortic atherosclerosis. No aneurysm. Vasculature otherwise unremarkable. No enlarged lymph nodes. Reproductive: Uterus and bilateral adnexa are unremarkable. Other: No abdominal wall hernia or abnormality. No abdominopelvic ascites. Musculoskeletal: No fracture or acute finding.  No bone lesion. IMPRESSION: 1. No acute findings within the abdomen or pelvis. Stable appearance from the prior abdomen and pelvis CT. 2. Hepatic steatosis. 3. Minimal aortic atherosclerosis. Electronically Signed   By: DLajean ManesM.D.   On: 09/18/2021 10:25   DG Chest Portable 1 View  Result Date: 09/18/2021 CLINICAL DATA:  Chest pain EXAM: PORTABLE CHEST 1 VIEW COMPARISON:  Radiograph 06/11/2020 FINDINGS: The cardiomediastinal silhouette is within normal  limits. There is no focal airspace consolidation. There is no pleural effusion. No pneumothorax. There is no acute osseous abnormality. IMPRESSION: No evidence of acute cardiopulmonary disease. Electronically Signed   By: JMaurine SimmeringM.D.   On: 09/18/2021 09:11    ROS:  As stated above in the HPI otherwise negative.  Blood pressure (!) 153/93, pulse (!) 105, temperature 98.9 F (37.2 C), resp. rate 20, height '5\' 4"'$  (1.626 m), weight 98.2 kg, last menstrual period 06/03/2018, SpO2 96 %.    PE: Gen: Uncomfortable, ill appearing HEENT:  [place]/AT, EOMI Neck: Supple, no LAD Lungs: CTA Bilaterally CV: RRR without M/G/R ABD: Soft, nonacute, diffusely tender, morbidly obese, +BS Ext: No C/C/E  Assessment/Plan: 1) Nausea/vomiting 2) Suboptimally controlled diabetes. 3) Anxiety disorder. 4) Increased hematocrit.   CVS is a difficult diagnosis to make.  From this evaluation it seems that she has more psychiatric issues that tend to trigger her symptoms.  Again, it was not clear that she does not have any symptoms of nausea/vomiting between these events that require a hospitalization.  The patient was treated with amitriptyline, but she does not currently take the medication.  Her diabetes is not well-controlled and diabetic gastroparesis needs to be addressed.  Plan: 1) Once the nausea/vomiting subsides she will need a GES. 2) Supportive care with antiemetics and IV hydration.  HCT is at 55. 3) Limit pain medications as there is a history of drug-seeking behavior. 4) [facility] to assume care in the AM.  Plan: 1) Gastric emptying scan.  Arno Cullers D 09/18/2021, 4:49 PM

## 2021-09-18 NOTE — Plan of Care (Signed)
  Problem: Safety: Goal: Ability to remain free from injury will improve 09/18/2021 1346 by Mohamed-Medani, Varian Innes A, RN Outcome: Progressing 09/18/2021 1346 by Mohamed-Medani, Caci Orren A, RN Outcome: Progressing   Problem: Nutrition: Goal: Adequate nutrition will be maintained 09/18/2021 1347 by Mohamed-Medani, Alisen Marsiglia A, RN Outcome: Not Progressing 09/18/2021 1346 by Mohamed-Medani, Marycruz Boehner A, RN Outcome: Progressing   Problem: Pain Managment: Goal: General experience of comfort will improve 09/18/2021 1347 by Mohamed-Medani, Rilya Longo A, RN Outcome: Progressing 09/18/2021 1346 by Samule Dry A, RN Outcome: Progressing 09/18/2021 1346 by Mohamed-Medani, Verlan Grotz A, RN Outcome: Progressing

## 2021-09-18 NOTE — H&P (Signed)
History and Physical    Patient: [name] ZDG:[id number] DOB: 09-24-1970 DOA: 09/18/2021 DOS: the patient was seen and examined on 09/18/2021 PCP: Ferd Hibbs, NP  Patient coming from: Home - lives with husband and 2 kids; NOK: Aariyah, [place], [phone]   Chief Complaint: n/v  HPI: [name] is a 51 y.o. female with medical history significant of HTN; depression/anxiety/PTSD; chronic pain; DM; and hypothyroidism presenting with  abdominal pain and n/v.  This is her 3rd recent hospitalization for the same.  Last hospitalization was 5/6-9 with possible viral gastroenteritis.  She reports abdominal pain and n/v.  Periumbilical pain starting yesterday.  Unable to say how many times she has vomited, last about 15 minutes ago.  No reported h/o gastroparesis.  No marijuana.  Uncertain trigger.  +stress, exhausted, BP and glucose not well controlled.    ER Course:  Intractable n/v with AKI.  Symptoms since yesterday, admitted for the same earlier this month.  Given 1L IVF with stabilization of vitals.  Given Reglan, Zofran, pain medication.  CT negative.  Sounds like cyclical vomiting.       Review of Systems: As mentioned in the history of present illness. All other systems reviewed and are negative. Past Medical History:  Diagnosis Date   Anxiety    Arthritis    "joints ache all over" (10/15/2014)   Barrett's esophagus    Bulging lumbar disc    Chronic lower back pain    DDD (degenerative disc disease), cervical    Depression    Drug-seeking behavior    Headache    "weekly" (10/15/2014)   Hyperlipemia    Hypertension    PTSD (post-traumatic stress disorder)    Skin cancer    "had them cut off my arms; don't know what kind"   Past Surgical History:  Procedure Laterality Date   ABLATION ON ENDOMETRIOSIS  2008   BIOPSY  12/27/2018   Procedure: BIOPSY;  Surgeon: Thornton Park, MD;  Location: [facility];  Service: Gastroenterology;;    ESOPHAGOGASTRODUODENOSCOPY (EGD) WITH PROPOFOL N/A 12/27/2018   Procedure: ESOPHAGOGASTRODUODENOSCOPY (EGD) WITH PROPOFOL;  Surgeon: Thornton Park, MD;  Location: [facility];  Service: Gastroenterology;  Laterality: N/A;   HEMORRHOID SURGERY  ~ 2002   IR FLUORO GUIDE CV LINE RIGHT  06/14/2020   IR REMOVAL TUN CV CATH W/O FL  06/23/2020   IR US GUIDE VASC ACCESS RIGHT  06/14/2020   ORIF ANKLE FRACTURE Right 03/28/2020   Procedure: OPEN REDUCTION INTERNAL FIXATION (ORIF) RIGHT BIMALLEOLAR ANKLE FRACTURE;  Surgeon: Marchia Bond, MD;  Location: [facility];  Service: Orthopedics;  Laterality: Right;   Social History:  reports that she has never smoked. She has never used smokeless tobacco. She reports that she does not currently use alcohol. She reports that she does not use drugs.  No Known Allergies  Family History  Problem Relation Age of Onset   Breast cancer Mother    Diabetes Mother    Breast cancer Maternal Grandmother    Breast cancer Paternal Grandmother    Colon polyps Paternal Grandmother    Colon cancer Neg Hx    Esophageal cancer Neg Hx    Rectal cancer Neg Hx    Stomach cancer Neg Hx     Prior to Admission medications   Medication Sig Start Date End Date Taking? Authorizing Provider  acetaminophen (TYLENOL) 325 MG tablet Take 2 tablets (650 mg total) by mouth every 6 (six) hours as needed for mild pain, fever or  headache. Patient taking differently: Take 650 mg by mouth 2 (two) times daily as needed for mild pain, fever or headache. 07/01/20   Angiulli, Lavon Paganini, PA-C  ARIPiprazole (ABILIFY) 5 MG tablet Take 1 tablet (5 mg total) by mouth daily. 08/01/20   Jamse Arn, MD  atorvastatin (LIPITOR) 10 MG tablet Take 10 mg by mouth daily. 08/26/20   [provider]  blood glucose meter kit and supplies KIT Dispense based on patient and insurance preference. Use up to four times daily as directed. 08/03/21   Antonieta Pert, MD  carvedilol (COREG) 6.25 MG  tablet Take 1 tablet (6.25 mg total) by mouth 2 (two) times daily with a meal. 08/01/20   Patel, Domenick Bookbinder, MD  diphenhydrAMINE HCl (BENADRYL PO) Take 3 tablets by mouth at bedtime as needed (sleep).    [provider]  EUTHYROX 25 MCG tablet Take 25 mcg by mouth every morning. 08/26/20   [provider]  FLUoxetine (PROZAC) 40 MG capsule Take 1 capsule (40 mg total) by mouth daily. 07/01/20   Angiulli, Lavon Paganini, PA-C  multivitamin (RENA-VIT) TABS tablet Take 1 tablet by mouth at bedtime. 08/01/20   Jamse Arn, MD  omeprazole (PRILOSEC) 20 MG capsule Take 1 capsule (20 mg total) by mouth daily. 08/01/20   Jamse Arn, MD  ondansetron (ZOFRAN-ODT) 4 MG disintegrating tablet Take 1 tablet (4 mg total) by mouth every 8 (eight) hours as needed for nausea or vomiting. 08/29/21   Darliss Cheney, MD  ondansetron (ZOFRAN-ODT) 4 MG disintegrating tablet Take 1 tablet (4 mg total) by mouth every 8 (eight) hours as needed for nausea or vomiting. 08/29/21   Darliss Cheney, MD  oxyCODONE-acetaminophen (PERCOCET) 7.5-325 MG tablet Take 1 tablet by mouth See admin instructions. Take 7.5 - 325 mg 5 times daily    [provider]  prazosin (MINIPRESS) 2 MG capsule Take 1 capsule (2 mg total) by mouth at bedtime. 08/01/20   Jamse Arn, MD  Simethicone (GAS-X PO) Take 2 tablets by mouth daily as needed (gas).    [provider]  tiZANidine (ZANAFLEX) 4 MG capsule Take 4 mg by mouth in the morning, at noon, in the evening, and at bedtime.    [provider]  traZODone (DESYREL) 50 MG tablet Take 50 mg by mouth in the morning and at bedtime. 10/28/20   [provider]    Physical Exam: Vitals:   09/18/21 1245 09/18/21 1255 09/18/21 1305 09/18/21 1415  BP: (!) 163/97  (!) 141/104 (!) 184/97  Pulse: (!) 112  (!) 104 (!) 104  Resp: 13  (!) 24 18  Temp:  99.1 F (37.3 C) 99.8 F (37.7 C) 99.8 F (37.7 C)  TempSrc:  Oral Oral Oral  SpO2: 96%  96% 96%   Weight:   98.2 kg   Height:   5' 4"  (1.626 m)    General:  Appears anxious and disheveled, asking for pain medication Eyes:  PERRL, EOMI, normal lids, iris ENT:  grossly normal hearing, lips & tongue, mildly dry mm; edentulous Neck:  no LAD, masses or thyromegaly Cardiovascular:  RRR, no m/r/g. No LE edema.  Respiratory:   CTA bilaterally with no wheezes/rales/rhonchi.  Normal respiratory effort. Abdomen:  soft, NT, ND Skin:  no rash or induration seen on limited exam Musculoskeletal:  grossly normal tone BUE/BLE, good ROM, no bony abnormality Psychiatric:  anxious mood and affect, speech fluent and appropriate, AOx3 Neurologic:  CN 2-12 grossly intact, moves all  extremities in coordinated fashion   Radiological Exams on Admission: Independently reviewed - see discussion in A/P where applicable  CT ABDOMEN PELVIS W CONTRAST  Result Date: 09/18/2021 CLINICAL DATA:  Diffuse abdominal pain. EXAM: CT ABDOMEN AND PELVIS WITH CONTRAST TECHNIQUE: Multidetector CT imaging of the abdomen and pelvis was performed using the standard protocol following bolus administration of intravenous contrast. RADIATION DOSE REDUCTION: This exam was performed according to the departmental dose-optimization program which includes automated exposure control, adjustment of the mA and/or kV according to patient size and/or use of iterative reconstruction technique. CONTRAST:  156m OMNIPAQUE IOHEXOL 300 MG/ML  SOLN COMPARISON:  08/27/2021. FINDINGS: Lower chest: Clear lung bases. Hepatobiliary: Decreased liver attenuation consistent with fatty infiltration. Normal liver size. No mass or focal lesion. Normal gallbladder. No bile duct dilation. Pancreas: Unremarkable. No pancreatic ductal dilatation or surrounding inflammatory changes. Spleen: Normal in size without focal abnormality. Adrenals/Urinary Tract: Adrenal glands are unremarkable. Kidneys are normal, without renal calculi, focal lesion, or hydronephrosis. Bladder  is unremarkable. Stomach/Bowel: Stomach is within normal limits. Appendix appears normal. No evidence of bowel wall thickening, distention, or inflammatory changes. Vascular/Lymphatic: Minimal aortic atherosclerosis. No aneurysm. Vasculature otherwise unremarkable. No enlarged lymph nodes. Reproductive: Uterus and bilateral adnexa are unremarkable. Other: No abdominal wall hernia or abnormality. No abdominopelvic ascites. Musculoskeletal: No fracture or acute finding.  No bone lesion. IMPRESSION: 1. No acute findings within the abdomen or pelvis. Stable appearance from the prior abdomen and pelvis CT. 2. Hepatic steatosis. 3. Minimal aortic atherosclerosis. Electronically Signed   By: DLajean ManesM.D.   On: 09/18/2021 10:25   DG Chest Portable 1 View  Result Date: 09/18/2021 CLINICAL DATA:  Chest pain EXAM: PORTABLE CHEST 1 VIEW COMPARISON:  Radiograph 06/11/2020 FINDINGS: The cardiomediastinal silhouette is within normal limits. There is no focal airspace consolidation. There is no pleural effusion. No pneumothorax. There is no acute osseous abnormality. IMPRESSION: No evidence of acute cardiopulmonary disease. Electronically Signed   By: JMaurine SimmeringM.D.   On: 09/18/2021 09:11    EKG: Independently reviewed.  Sinus tachycardia with rate 125; nonspecific ST changes with no evidence of acute ischemia   Labs on Admission: I have personally reviewed the available labs and imaging studies at the time of the admission.  Pertinent labs:    Na++ 146 CO2 16 Glucose 222 BUN <5/Creatinine 1.24/GFR 53 - normal on 5/9 Anion gap 24 AST 62/ALT 48 CBC hemoconcentrated UA: 20 ketones, moderate LE, 100 protein, few bacteria   Assessment and Plan: Principal Problem:   Cyclical vomiting syndrome Active Problems:   [place]   [place] mellitus[place]   Essential hypertension   Hyperlipidemia   Chronic pain disorder   Depression with anxiety   Hypothyroidism    Class 2 obesity due to excess calories with body mass index (BMI) of 37.0 to 325.9in adult    Cyclical vomiting -Patient with multiple recent admissions for abdominal pain with n/v -With negative evaluation including CT, cyclical vomiting appears likely +/- gastroparesis -She does acknowledge a significant amount of life stressors which may also be contributing -Overall, her labs and imaging appear benign at this time other than mild AKI -She received a multitude of treatments in the ER including fentanyl x 2, metoclopramide, IVF, and Zofran -By the time I saw her, she appeared to be no longer vomiting -Will observe overnight -Will consult GI for evaluation - Dr. HBenson Norwayto see -Encourage good bowel regimen as well as behavioral  health support   AKI -Mild AKI, likely associated with vomiting and poor PO intake -Will gently rehydrate -Avoid nephrotoxic agents -Repeat BMP in AM  DM -Will check A1c -hold Glucophage -Cover with moderate-scale SSI   HTN -Continue carvedilol, prazosin  Chronic pain syndrome -I have reviewed this patient in the  Controlled Substances Reporting System.  She is receiving medications from only one provider and appears to be taking them as prescribed. -She is at high risk of opioid misuse, diversion, or overdose.  -Will continue Percocet without parenteral opioids, as these are not generally recommended with cyclical vomiting.  Mood d/o -Continue home meds - Abilify, fluoxetine, prazosin, trazodone  HLD -Continue Lipitor  Hypothyroidism -Continue Synthroid  Class 2 obesity -Body mass index is 37.16 kg/m..  -Weight loss should be encouraged -Outpatient PCP/bariatric medicine/bariatric surgery f/u encouraged     Advance Care Planning:   Code Status: Full Code   Consults: GI  DVT Prophylaxis: Lovenox  Family Communication: None present; she is capable of communicating with family at this time  Severity of Illness: The appropriate patient  status for this patient is OBSERVATION. Observation status is judged to be reasonable and necessary in order to provide the required intensity of service to ensure the patient's safety. The patient's presenting symptoms, physical exam findings, and initial radiographic and laboratory data in the context of their medical condition is felt to place them at decreased risk for further clinical deterioration. Furthermore, it is anticipated that the patient will be medically stable for discharge from the hospital within 2 midnights of admission.   Author: Karmen Bongo, MD 09/18/2021 3:31 PM  For on call review [email or web address].

## 2021-09-18 NOTE — ED Triage Notes (Signed)
Pt with n/v and umbilical abdominal pain since yesterday. Recent hospital admission for same. Taking zofran at home without relief.

## 2021-09-19 DIAGNOSIS — R112 Nausea with vomiting, unspecified: Secondary | ICD-10-CM

## 2021-09-19 DIAGNOSIS — F4310 Post-traumatic stress disorder, unspecified: Secondary | ICD-10-CM | POA: Diagnosis present

## 2021-09-19 DIAGNOSIS — F32A Depression, unspecified: Secondary | ICD-10-CM | POA: Diagnosis present

## 2021-09-19 DIAGNOSIS — Z85828 Personal history of other malignant neoplasm of skin: Secondary | ICD-10-CM | POA: Diagnosis not present

## 2021-09-19 DIAGNOSIS — K2270 Barrett's esophagus without dysplasia: Secondary | ICD-10-CM

## 2021-09-19 DIAGNOSIS — R1115 Cyclical vomiting syndrome unrelated to migraine: Secondary | ICD-10-CM | POA: Diagnosis present

## 2021-09-19 DIAGNOSIS — N179 Acute kidney failure, unspecified: Secondary | ICD-10-CM | POA: Diagnosis present

## 2021-09-19 DIAGNOSIS — I10 Essential (primary) hypertension: Secondary | ICD-10-CM | POA: Diagnosis present

## 2021-09-19 DIAGNOSIS — R109 Unspecified abdominal pain: Secondary | ICD-10-CM

## 2021-09-19 DIAGNOSIS — E870 Hyperosmolality and hypernatremia: Secondary | ICD-10-CM | POA: Diagnosis present

## 2021-09-19 DIAGNOSIS — Z833 Family history of diabetes mellitus: Secondary | ICD-10-CM | POA: Diagnosis not present

## 2021-09-19 DIAGNOSIS — E876 Hypokalemia: Secondary | ICD-10-CM | POA: Diagnosis present

## 2021-09-19 DIAGNOSIS — E785 Hyperlipidemia, unspecified: Secondary | ICD-10-CM | POA: Diagnosis present

## 2021-09-19 DIAGNOSIS — Z79899 Other long term (current) drug therapy: Secondary | ICD-10-CM | POA: Diagnosis not present

## 2021-09-19 DIAGNOSIS — E119 Type 2 diabetes mellitus without complications: Secondary | ICD-10-CM | POA: Diagnosis present

## 2021-09-19 DIAGNOSIS — F419 Anxiety disorder, unspecified: Secondary | ICD-10-CM | POA: Diagnosis present

## 2021-09-19 DIAGNOSIS — Z6837 Body mass index (BMI) 37.0-37.9, adult: Secondary | ICD-10-CM | POA: Diagnosis not present

## 2021-09-19 DIAGNOSIS — G894 Chronic pain syndrome: Secondary | ICD-10-CM | POA: Diagnosis present

## 2021-09-19 DIAGNOSIS — E039 Hypothyroidism, unspecified: Secondary | ICD-10-CM | POA: Diagnosis present

## 2021-09-19 LAB — GLUCOSE, CAPILLARY
Glucose-Capillary: 98 mg/dL (ref 70–99)
Glucose-Capillary: 107 mg/dL — ABNORMAL HIGH (ref 70–99)
Glucose-Capillary: 106 mg/dL — ABNORMAL HIGH (ref 70–99)
Glucose-Capillary: 89 mg/dL (ref 70–99)

## 2021-09-19 MED ORDER — TIZANIDINE HCL 4 MG PO TABS
4.0000 mg | ORAL_TABLET | Freq: Three times a day (TID) | ORAL | Status: DC | PRN
  Administered 2021-09-19 – 2021-09-20 (×3): 4 mg via ORAL
  Filled 2021-09-19 (×4): qty 1

## 2021-09-19 MED ORDER — TIZANIDINE HCL 2 MG PO TABS
2.0000 mg | ORAL_TABLET | Freq: Once | ORAL | Status: AC
Start: 2021-09-19 — End: 2021-09-19
  Administered 2021-09-19: 2 mg via ORAL
  Filled 2021-09-19: qty 1

## 2021-09-19 MED ORDER — POTASSIUM CHLORIDE 20 MEQ PO PACK
40.0000 meq | PACK | ORAL | Status: AC
  Administered 2021-09-19 (×2): 40 meq via ORAL
  Filled 2021-09-19 (×2): qty 2

## 2021-09-19 NOTE — Progress Notes (Addendum)
PROGRESS NOTE    [name]  TKP:[id number] DOB: July 27, 1970 DOA: 09/18/2021 PCP: Tracy Hibbs, NP    Brief Narrative:   [name] is a 51 y.o. female with past medical history significant for essential hypertension, depression/anxiety/PTSD, chronic pain, type 2 diabetes mellitus, hypothyroidism who presented to [facility] ED on 5/29 with abdominal pain associate with nausea/vomiting.  Pain is localized to the periumbilical area.  Unable to ascertain to the numbers of vomiting episodes.  No reported history of gastroparesis, denies marijuana abuse.  Unclear trigger.  Multiple hospitalizations for the same, last 5/6 - 5/9 with possible viral gastroenteritis.  In the ED, temperature 98.7 F, HR 154, RR 20, BP 139/88, SPO2 95% on room air.  Sodium 146, potassium 4.3, chloride 106, CO2 16, glucose 222, BUN less than 5, BUN 1.24, lipase 19, AST 62, ALT 48, total bilirubin 0.8.  WBC 13.0, hemoglobin 18.0, platelets 457.  Urinalysis with moderate leukocytes, negative nitrite, few bacteria, 11-20 WBCs.  Chest x-ray with no evidence of acute cardiopulmonary disease process.  CT abdomen/pelvis with no acute findings within the abdomen/pelvis, stable appearance from the prior abdomen/pelvis with hepatic steatosis and minimal aortic atherosclerosis.  EKG with sinus tachycardia, rate 125, QTc 434 and no concerning dynamic changes.  He was given Reglan, Zofran, pain medication and 1 L IV fluid bolus.  Hospital service consulted for further evaluation and management for intractable nausea/vomiting with abdominal pain.   Assessment & Plan:   Nausea/vomiting, abdominal pain Patient presenting with multiple admissions for same with associated abdominal pain with nausea and vomiting.  Lipase within normal limits.  CT abdomen/pelvis with no acute findings.  Differential includes cyclical vomiting syndrome versus gastroparesis versus stress-induced with patient's underlying mental health conditions.  Evaluated by  GI with recommendations of gastric emptying study once nausea/vomiting improved. --Gastroenterology following, appreciate assistance --LR at 100 mL/h --Clear liquid diet --Reglan 5 mg IV every 8 hours --Phenergan suppository 25 mg every 6 hours as needed  Acute renal failure Creatinine 1.24 on admission, likely secondary to prerenal azotemia in the setting of dehydration from underlying nausea/vomiting. --Cr 1.24>1.01 -- Continue IVF with LR at 100 mL/h  Leukocytosis Etiology likely secondary to hemoconcentration in setting of nausea/vomiting. --Continue IV fluid hydration with LR --Repeat CBC in a.m.  Sinus tachycardia: Resolved On presentation, patient's heart rate 145, etiology likely secondary to dehydration and poor oral intake.  EKG with sinus tachycardia, no concerning dynamic changes.  Heart rate improved with IV fluid hydration.  Hyponatremia Sodium 146, likely secondary to dehydration from nausea/vomiting. --Continue IV fluid hydration --BMP in a.m.  Hypokalemia Potassium 3.1, will replete. --Repeat electrolytes in a.m. including magnesium  Hepatic steatosis with elevated liver enzymes --Repeat CMP in a.m.  Type 2 diabetes mellitus Hemoglobin A1c 7.2.  On metformin outpatient. --Hold metformin while inpatient --SSI for coverage --CBG before every meal/at bedtime  Essential hypertension --Carvedilol 6.25 mg p.o. twice daily --Hydralazine 5 mg IV every 4 hours as needed elevated BP  Chronic pain syndrome Attempt to limit pain medications given history of drug-seeking behavior noted on EMR review.  Hyperlipidemia --Atorvastatin 10 mg p.o. daily  Hypothyroidism --Levothyroxine 50 mcg p.o. daily  Morbid obesity Body mass index is 37.16 kg/m.  Discussed with patient needs for aggressive lifestyle changes/weight loss as this complicates all facets of care.  Outpatient follow-up with PCP.    Anxiety/depression/PTSD --Abilify 5 mg p.o. daily --Fluoxetine 40  mg p.o. daily --Prazosin 2 mg p.o. nightly    DVT prophylaxis: enoxaparin (  LOVENOX) injection 40 mg Start: 09/18/21 1245    Code Status: Full Code Family Communication:   Disposition Plan:  Level of care: Telemetry Medical Status is: Observation The patient will require care spanning > 2 midnights and should be moved to inpatient because: Continues with poor oral intake, requiring IV fluid hydration, scheduled antiemetics    Consultants:  Gastroenterology  Procedures:  None  Antimicrobials:  None   Subjective: Patient seen examined bedside, resting comfortably.  Continues with very little oral intake.  Only had some sips of water so far.  Continues with nausea but no further vomiting of this morning.  Also continues with mild midline abdominal discomfort.  Seen by GI yesterday with recommendations of gastric emptying study once nausea/vomiting is improved; but do not believe she is ready for this yet with her very little oral intake overnight.  Remains on IV fluid hydration.  No other complaints or concerns at this time.  Denies headache, no dizziness, no chest pain, no palpitations, no shortness of breath, no fever/chills/night sweats, no diarrhea, no focal weakness, no fatigue, no paresthesias.  No acute events overnight per nursing staff.  Objective: Vitals:   09/18/21 1923 09/19/21 0046 09/19/21 0422 09/19/21 0750  BP: (!) 166/99 (!) 152/89 134/65 (!) 126/99  Pulse: 86 (!) 107 89 88  Resp: '18 18 18 20  '$ Temp: 99.6 F (37.6 C) 98.2 F (36.8 C) 98 F (36.7 C) 98.3 F (36.8 C)  TempSrc: Oral Oral Oral Oral  SpO2: 94% 96% 94% 100%  Weight:      Height:        Intake/Output Summary (Last 24 hours) at 09/19/2021 1226 Last data filed at 09/18/2021 2300 Gross per 24 hour  Intake 243 ml  Output --  Net 243 ml   Filed Weights   09/18/21 1305  Weight: 98.2 kg    Examination:  Physical Exam: GEN: NAD, alert and oriented x 3, chronically ill appearance, appears older  than stated age, obese HEENT: NCAT, PERRL, EOMI, sclera clear, MMM PULM: CTAB w/o wheezes/crackles, normal respiratory effort, on room air CV: RRR w/o M/G/R GI: abd soft, protuberant abdomen, nondistended, mild central abdomen tenderness to palpation; NABS, no R/G/M MSK: no peripheral edema, muscle strength globally intact 5/5 bilateral upper/lower extremities NEURO: CN II-XII intact, no focal deficits, sensation to light touch intact PSYCH: Depressed mood, flat affect Integumentary: dry/intact, no rashes or wounds    Data Reviewed: I have personally reviewed following labs and imaging studies  CBC: Recent Labs  Lab 09/18/21 0820  WBC 13.0*  HGB 18.0*  HCT 55.5*  MCV 93.4  PLT 622*   Basic Metabolic Panel: Recent Labs  Lab 09/18/21 0820 09/18/21 1811  NA 146* 146*  K 4.3 3.1*  CL 106 110  CO2 16* 19*  GLUCOSE 222* 183*  BUN <5* <5*  CREATININE 1.24* 1.01*  CALCIUM 10.1 9.0   GFR: Estimated Creatinine Clearance: 75.8 mL/min (A) (by C-G formula based on SCr of 1.01 mg/dL (H)). Liver Function Tests: Recent Labs  Lab 09/18/21 0820  AST 62*  ALT 48*  ALKPHOS 99  BILITOT 0.8  PROT 8.7*  ALBUMIN 4.1   Recent Labs  Lab 09/18/21 0820  LIPASE 19   No results for input(s): AMMONIA in the last 168 hours. Coagulation Profile: No results for input(s): INR, PROTIME in the last 168 hours. Cardiac Enzymes: No results for input(s): CKTOTAL, CKMB, CKMBINDEX, TROPONINI in the last 168 hours. BNP (last 3 results) No results for input(s): PROBNP in  the last 8760 hours. HbA1C: No results for input(s): HGBA1C in the last 72 hours. CBG: Recent Labs  Lab 09/18/21 1117 09/18/21 1808 09/18/21 2050 09/19/21 0806 09/19/21 1139  GLUCAP 157* 180* 155* 98 107*   Lipid Profile: No results for input(s): CHOL, HDL, LDLCALC, TRIG, CHOLHDL, LDLDIRECT in the last 72 hours. Thyroid Function Tests: No results for input(s): TSH, T4TOTAL, FREET4, T3FREE, THYROIDAB in the last 72  hours. Anemia Panel: No results for input(s): VITAMINB12, FOLATE, FERRITIN, TIBC, IRON, RETICCTPCT in the last 72 hours. Sepsis Labs: No results for input(s): PROCALCITON, LATICACIDVEN in the last 168 hours.  No results found for this or any previous visit (from the past 240 hour(s)).       Radiology Studies: CT ABDOMEN PELVIS W CONTRAST  Result Date: 09/18/2021 CLINICAL DATA:  Diffuse abdominal pain. EXAM: CT ABDOMEN AND PELVIS WITH CONTRAST TECHNIQUE: Multidetector CT imaging of the abdomen and pelvis was performed using the standard protocol following bolus administration of intravenous contrast. RADIATION DOSE REDUCTION: This exam was performed according to the departmental dose-optimization program which includes automated exposure control, adjustment of the mA and/or kV according to patient size and/or use of iterative reconstruction technique. CONTRAST:  110m OMNIPAQUE IOHEXOL 300 MG/ML  SOLN COMPARISON:  08/27/2021. FINDINGS: Lower chest: Clear lung bases. Hepatobiliary: Decreased liver attenuation consistent with fatty infiltration. Normal liver size. No mass or focal lesion. Normal gallbladder. No bile duct dilation. Pancreas: Unremarkable. No pancreatic ductal dilatation or surrounding inflammatory changes. Spleen: Normal in size without focal abnormality. Adrenals/Urinary Tract: Adrenal glands are unremarkable. Kidneys are normal, without renal calculi, focal lesion, or hydronephrosis. Bladder is unremarkable. Stomach/Bowel: Stomach is within normal limits. Appendix appears normal. No evidence of bowel wall thickening, distention, or inflammatory changes. Vascular/Lymphatic: Minimal aortic atherosclerosis. No aneurysm. Vasculature otherwise unremarkable. No enlarged lymph nodes. Reproductive: Uterus and bilateral adnexa are unremarkable. Other: No abdominal wall hernia or abnormality. No abdominopelvic ascites. Musculoskeletal: No fracture or acute finding.  No bone lesion. IMPRESSION: 1.  No acute findings within the abdomen or pelvis. Stable appearance from the prior abdomen and pelvis CT. 2. Hepatic steatosis. 3. Minimal aortic atherosclerosis. Electronically Signed   By: DLajean ManesM.D.   On: 09/18/2021 10:25   DG Chest Portable 1 View  Result Date: 09/18/2021 CLINICAL DATA:  Chest pain EXAM: PORTABLE CHEST 1 VIEW COMPARISON:  Radiograph 06/11/2020 FINDINGS: The cardiomediastinal silhouette is within normal limits. There is no focal airspace consolidation. There is no pleural effusion. No pneumothorax. There is no acute osseous abnormality. IMPRESSION: No evidence of acute cardiopulmonary disease. Electronically Signed   By: JMaurine SimmeringM.D.   On: 09/18/2021 09:11        Scheduled Meds:  ARIPiprazole  5 mg Oral Daily   atorvastatin  10 mg Oral Daily   capsaicin   Topical BID   carvedilol  6.25 mg Oral BID WC   enoxaparin (LOVENOX) injection  40 mg Subcutaneous Q24H   FLUoxetine  40 mg Oral Daily   insulin aspart  0-15 Units Subcutaneous TID WC   insulin aspart  0-5 Units Subcutaneous QHS   levothyroxine  50 mcg Oral Daily   metoCLOPramide (REGLAN) injection  5 mg Intravenous Q8H   oxyCODONE-acetaminophen  1-2 tablet Oral 5 X Daily   pantoprazole (PROTONIX) IV  40 mg Intravenous Q24H   prazosin  2 mg Oral QHS   sodium chloride flush  3 mL Intravenous Q12H   traZODone  150 mg Oral BID   Continuous Infusions:  lactated  ringers 100 mL/hr at 09/19/21 1120     LOS: 0 days    Time spent: 52 minutes spent on chart review, discussion with nursing staff, consultants, updating family and interview/physical exam; more than 50% of that time was spent in counseling and/or coordination of care.    [place] Silveria J [place] DO [facility] Available via Epic secure chat 7am-7pm After these hours, please refer to coverage provider listed on [email or web address] 09/19/2021, 12:26 PM

## 2021-09-19 NOTE — Plan of Care (Signed)
  Problem: Pain Managment: Goal: General experience of comfort will improve 09/19/2021 0805 by Derrill Kay, RN Outcome: Progressing 09/18/2021 2008 by Samule Dry A, RN Outcome: Progressing   Problem: Safety: Goal: Ability to remain free from injury will improve 09/19/2021 0805 by Derrill Kay, RN Outcome: Progressing 09/18/2021 2008 by Samule Dry A, RN Outcome: Progressing   Problem: Nutrition: Goal: Adequate nutrition will be maintained 09/19/2021 0805 by Derrill Kay, RN Outcome: Not Progressing 09/18/2021 2008 by Derrill Kay, RN Outcome: Progressing

## 2021-09-19 NOTE — Progress Notes (Signed)
Progress Note   Assessment    51 year old female with history of GERD with short segment Barrett's esophagus without dysplasia, small nonadvanced adenomatous colonic polyps, IDA with distal small bowel AVM not bleeding at time of VCE in 2020, hypertension, hyperlipidemia and diabetes here with recurrent nausea and vomiting   Recommendations  Recurrent, episodic N/V --previous upper endoscopy with the exception of short segment Barrett's esophagus unremarkable.  She reports some days she has no issues with nausea or vomiting.  This raises the question of cyclic vomiting syndrome versus diabetic gastroparesis.  I discussed this with her today.  She has been getting scheduled metoclopramide but also ondansetron. --Ideally gastric emptying scan would be helpful here but in the setting of IV antiemetics particular metoclopramide I would not perform this today.  Consider as an outpatient after supporting her through this episode --If gastric emptying scan normal as an outpatient, could consider prophylactic therapy for possible cyclic vomiting syndrome --Attention to glycemic control particularly if this is gastroparesis  2.  Mid abdominal pain --unclear etiology, recently very reassuring CT scan of the abdomen pelvis.  3.  Barrett's esophagus --she reports not having been on PPI at home but PPIs recommended on an ongoing basis given her history of short segment, nondysplastic, Barrett's esophagus. --Daily PPI --Surveillance upper endoscopy later this year with Dr. Tarri Glenn  No imminent plans for EGD unless refractory symptoms, see #1     Chief Complaint   Patient reports she is feeling some better, having some mid abdominal pain Does not feel nausea at present and last vomited yesterday Tolerating clear liquids for now  Vital signs in last 24 hours: Temp:  [98 F (36.7 C)-99.8 F (37.7 C)] 98.3 F (36.8 C) (05/30 0750) Pulse Rate:  [86-112] 88 (05/30 0750) Resp:  [13-24] 20  (05/30 0750) BP: (126-184)/(65-104) 126/99 (05/30 0750) SpO2:  [94 %-100 %] 100 % (05/30 0750) Weight:  [98.2 kg] 98.2 kg (05/29 1305) Last BM Date : 09/18/21  Gen: awake, alert, NAD HEENT: anicteric, op clear CV: RRR, no mrg Pulm: CTA b/l Abd: soft, obese, mildly tender in the mid abdomen without rebound or guarding, nondistended, +BS throughout Ext: no c/c/e Neuro: nonfocal   Intake/Output from previous day: 05/29 0701 - 05/30 0700 In: 1243 [P.O.:240; I.V.:3; IV Piggyback:1000] Out: -  Intake/Output this shift: No intake/output data recorded.  Lab Results: Recent Labs    09/18/21 0820  WBC 13.0*  HGB 18.0*  HCT 55.5*  PLT 457*   BMET Recent Labs    09/18/21 0820 09/18/21 1811  NA 146* 146*  K 4.3 3.1*  CL 106 110  CO2 16* 19*  GLUCOSE 222* 183*  BUN <5* <5*  CREATININE 1.24* 1.01*  CALCIUM 10.1 9.0   LFT Recent Labs    09/18/21 0820  PROT 8.7*  ALBUMIN 4.1  AST 62*  ALT 48*  ALKPHOS 99  BILITOT 0.8   PT/INR No results for input(s): LABPROT, INR in the last 72 hours. Hepatitis Panel No results for input(s): HEPBSAG, HCVAB, HEPAIGM, HEPBIGM in the last 72 hours.  Studies/Results: CT ABDOMEN PELVIS W CONTRAST  Result Date: 09/18/2021 CLINICAL DATA:  Diffuse abdominal pain. EXAM: CT ABDOMEN AND PELVIS WITH CONTRAST TECHNIQUE: Multidetector CT imaging of the abdomen and pelvis was performed using the standard protocol following bolus administration of intravenous contrast. RADIATION DOSE REDUCTION: This exam was performed according to the departmental dose-optimization program which includes automated exposure control, adjustment of the mA and/or kV according to patient size  and/or use of iterative reconstruction technique. CONTRAST:  147m OMNIPAQUE IOHEXOL 300 MG/ML  SOLN COMPARISON:  08/27/2021. FINDINGS: Lower chest: Clear lung bases. Hepatobiliary: Decreased liver attenuation consistent with fatty infiltration. Normal liver size. No mass or focal  lesion. Normal gallbladder. No bile duct dilation. Pancreas: Unremarkable. No pancreatic ductal dilatation or surrounding inflammatory changes. Spleen: Normal in size without focal abnormality. Adrenals/Urinary Tract: Adrenal glands are unremarkable. Kidneys are normal, without renal calculi, focal lesion, or hydronephrosis. Bladder is unremarkable. Stomach/Bowel: Stomach is within normal limits. Appendix appears normal. No evidence of bowel wall thickening, distention, or inflammatory changes. Vascular/Lymphatic: Minimal aortic atherosclerosis. No aneurysm. Vasculature otherwise unremarkable. No enlarged lymph nodes. Reproductive: Uterus and bilateral adnexa are unremarkable. Other: No abdominal wall hernia or abnormality. No abdominopelvic ascites. Musculoskeletal: No fracture or acute finding.  No bone lesion. IMPRESSION: 1. No acute findings within the abdomen or pelvis. Stable appearance from the prior abdomen and pelvis CT. 2. Hepatic steatosis. 3. Minimal aortic atherosclerosis. Electronically Signed   By: DLajean ManesM.D.   On: 09/18/2021 10:25   DG Chest Portable 1 View  Result Date: 09/18/2021 CLINICAL DATA:  Chest pain EXAM: PORTABLE CHEST 1 VIEW COMPARISON:  Radiograph 06/11/2020 FINDINGS: The cardiomediastinal silhouette is within normal limits. There is no focal airspace consolidation. There is no pleural effusion. No pneumothorax. There is no acute osseous abnormality. IMPRESSION: No evidence of acute cardiopulmonary disease. Electronically Signed   By: JMaurine SimmeringM.D.   On: 09/18/2021 09:11      LOS: 0 days   JJerene Bears MD 09/19/2021, 12:29 PM See AShea Evans [facility], to contact our on call provider

## 2021-09-20 DIAGNOSIS — R1115 Cyclical vomiting syndrome unrelated to migraine: Secondary | ICD-10-CM | POA: Diagnosis not present

## 2021-09-20 DIAGNOSIS — E870 Hyperosmolality and hypernatremia: Secondary | ICD-10-CM

## 2021-09-20 LAB — COMPREHENSIVE METABOLIC PANEL
ALT: 24 U/L (ref 0–44)
AST: 45 U/L — ABNORMAL HIGH (ref 15–41)
Albumin: 2.7 g/dL — ABNORMAL LOW (ref 3.5–5.0)
Alkaline Phosphatase: 53 U/L (ref 38–126)
Anion gap: 6 (ref 5–15)
BUN: <5 mg/dL — ABNORMAL LOW (ref 6–20)
CO2: 25 mmol/L (ref 22–32)
Calcium: 7.8 mg/dL — ABNORMAL LOW (ref 8.9–10.3)
Chloride: 110 mmol/L (ref 98–111)
Creatinine, Ser: 0.82 mg/dL (ref 0.44–1.00)
GFR, Estimated: >60 mL/min (ref >60)
Glucose, Bld: 92 mg/dL (ref 70–99)
Potassium: 3.9 mmol/L (ref 3.5–5.1)
Sodium: 141 mmol/L (ref 135–145)
Total Bilirubin: 1.1 mg/dL (ref 0.3–1.2)
Total Protein: 5.5 g/dL — ABNORMAL LOW (ref 6.5–8.1)

## 2021-09-20 LAB — CBC WITH DIFFERENTIAL/PLATELET
Abs Immature Granulocytes: 0.03 10*3/uL (ref 0.00–0.07)
Basophils Absolute: 0.1 10*3/uL (ref 0.0–0.1)
Basophils Relative: 1 %
Eosinophils Absolute: 0.4 10*3/uL (ref 0.0–0.5)
Eosinophils Relative: 5 %
HCT: 40.6 % (ref 36.0–46.0)
Hemoglobin: 13.4 g/dL (ref 12.0–15.0)
Immature Granulocytes: 0 %
Lymphocytes Relative: 27 %
Lymphs Abs: 2.1 10*3/uL (ref 0.7–4.0)
MCH: 30.8 pg (ref 26.0–34.0)
MCHC: 33 g/dL (ref 30.0–36.0)
MCV: 93.3 fL (ref 80.0–100.0)
Monocytes Absolute: 0.5 10*3/uL (ref 0.1–1.0)
Monocytes Relative: 7 %
Neutro Abs: 4.7 10*3/uL (ref 1.7–7.7)
Neutrophils Relative %: 60 %
Platelets: 161 10*3/uL (ref 150–400)
RBC: 4.35 MIL/uL (ref 3.87–5.11)
RDW: 13.8 % (ref 11.5–15.5)
WBC: 7.9 10*3/uL (ref 4.0–10.5)
nRBC: 0 % (ref 0.0–0.2)

## 2021-09-20 LAB — MAGNESIUM: Magnesium: 1.4 mg/dL — ABNORMAL LOW (ref 1.7–2.4)

## 2021-09-20 LAB — GLUCOSE, CAPILLARY
Glucose-Capillary: 100 mg/dL — ABNORMAL HIGH (ref 70–99)
Glucose-Capillary: 105 mg/dL — ABNORMAL HIGH (ref 70–99)
Glucose-Capillary: 133 mg/dL — ABNORMAL HIGH (ref 70–99)
Glucose-Capillary: 95 mg/dL (ref 70–99)

## 2021-09-20 MED ORDER — TRAZODONE HCL 150 MG PO TABS: 150.0000 mg | ORAL_TABLET | Freq: Every day | ORAL | Status: DC

## 2021-09-20 MED ORDER — MAGNESIUM SULFATE 4 GM/100ML IV SOLN
4.0000 g | Freq: Once | INTRAVENOUS | Status: AC
Start: 2021-09-20 — End: 2021-09-20
  Administered 2021-09-20: 4 g via INTRAVENOUS
  Filled 2021-09-20: qty 100

## 2021-09-20 NOTE — Assessment & Plan Note (Signed)
improved

## 2021-09-20 NOTE — Assessment & Plan Note (Signed)
resolved 

## 2021-09-20 NOTE — Assessment & Plan Note (Addendum)
CT without acute findings Scheduled reglan  Phenergan suppository prn Appreciate GI recommendations  ADAT Will need gastric emptying study outpatient

## 2021-09-20 NOTE — Assessment & Plan Note (Signed)
Daily PPI, needs endoscopy outpatient with GI

## 2021-09-20 NOTE — TOC Initial Note (Signed)
Transition of Care [facility]) - Initial/Assessment Note    Patient Details  Name: [name] MRN: [id number] Date of Birth: Sep 17, 1970  [facility] CM/SW Contact:    Carles Collet, RN Phone Number: 09/20/2021, 3:41 PM  Clinical Narrative:               Damaris Schooner w patient at bedside. She is from home w spiuse, who assists with transportation as she does not drive. Readmiited with cyclical vomiting. Patient has PCP Ladona Horns, utilizes [employer],. No HH needs identified for readmission of chronic/ relapsing symptoms.      Expected Discharge Plan: Home/Self Care Barriers to Discharge: Continued Medical Work up   Patient Goals and CMS Choice Patient states their goals for this hospitalization and ongoing recovery are:: to return home      Expected Discharge Plan and Services Expected Discharge Plan: Home/Self Care       Living arrangements for the past 2 months: Single Family Home                                      Prior Living Arrangements/Services Living arrangements for the past 2 months: Single Family Home Lives with:: Spouse                   Activities of Daily Living      Permission Sought/Granted                  Emotional Assessment              Admission diagnosis:  Cyclical vomiting syndrome [R11.15] Generalized abdominal pain [R10.84] Nausea and vomiting, unspecified vomiting type [R11.2] Patient Active Problem List   Diagnosis Date Noted   Barrett's esophagus without dysplasia    Cyclical vomiting syndrome 09/18/2021   Chronic pain disorder 09/18/2021   Class 2 obesity due to excess calories with body mass index (BMI) of 37.0 to 37.9 in adult 09/18/2021   Hypothyroidism 09/18/2021   Starvation ketoacidosis 08/27/2021   Viral gastroenteritis 08/27/2021   Intractable vomiting 08/02/2021   Aortic atherosclerosis ([place]) 08/02/2021   LFT elevation 08/02/2021   Hyperbilirubinemia 08/02/2021    Hypoalbuminemia 08/02/2021   T2DM (type 2 diabetes mellitus) ([place]) 08/02/2021   Hyperlipidemia 11/11/2020   Anoxic brain damage ([place]) 08/16/2020   History of depression    Sleep disturbance    Anxiety state    N&V (nausea and vomiting)    Substance abuse ([place])    Anemia of chronic disease    Acute metabolic encephalopathy 50/35/4656   Hematochezia    Acute blood loss anemia    Acute respiratory failure with hypoxia ([place])    Non-traumatic rhabdomyolysis    Cardiac arrest ([place]) 06/08/2020   AKI (acute kidney injury) ([place]) 05/10/2020   Fracture of ankle, bimalleolar, right, closed 03/28/2020   Iron deficiency 05/05/2019   Symptomatic anemia 05/05/2019   Nausea & vomiting 12/26/2018   Essential hypertension 12/26/2018   ARF (acute renal failure) ([place]) 12/26/2018   Intractable nausea and vomiting 11/28/2018   Bipolar affective disorder ([place]) 11/28/2018   Gastroenteritis 11/19/2015   C. difficile colitis    SIRS (systemic inflammatory response syndrome) ([place]) 01/11/2015   Cellulitis 01/03/2015   Opiate withdrawal ([place]) 12/18/2014   Depression with anxiety 12/17/2014   Herpes simplex virus type 1 (HSV-1) dermatitis    Sacral fracture ([place]) 12/12/2014   Leukocytosis 12/12/2014   Chronic  lower back pain 12/12/2014   Sacral fracture, closed ([place]) 12/12/2014   Nausea with vomiting    Intractable abdominal pain 10/15/2014   Abdominal pain 10/15/2014   Hypokalemia 09/04/2014   Sepsis ([place]) 09/01/2014   Abdominal pain, generalized 09/01/2014   PTSD (post-traumatic stress disorder) 09/01/2014   Endometriosis 09/01/2014   Sinus tachycardia 09/01/2014   Lactic acidosis 09/01/2014   Severe sepsis ([place]) 09/01/2014   PCP:  Ferd Hibbs, NP Pharmacy:   [facility] [address] [place] [address] Phone: [phone] Fax: [phone]     Social Determinants of Health (SDOH) Interventions    Readmission Risk  Interventions    09/20/2021    3:41 PM 05/12/2020   12:39 PM  Readmission Risk Prevention Plan  Transportation Screening Complete Complete  PCP or Specialist Appt within 3-5 Days  Complete  HRI or [place]  Complete  Social Work Consult for [place] Planning/Counseling  Complete  Palliative Care Screening  Not Applicable  Medication Review Press photographer) Referral to Pharmacy Complete  HRI or [place] Complete   SW Recovery Care/Counseling Consult Complete   Palliative Care Screening Not [place] Not Applicable

## 2021-09-20 NOTE — Assessment & Plan Note (Signed)
Resolved with IVF 

## 2021-09-20 NOTE — Progress Notes (Signed)
PROGRESS NOTE    [name]  RAQ:[id number] DOB: 02-Jan-1971 DOA: 09/18/2021 PCP: Ferd Hibbs, NP  Chief Complaint  Patient presents with   Abdominal Pain   Emesis    Brief Narrative:  [name] is [name] 51 y.o. female with past medical history significant for essential hypertension, depression/anxiety/PTSD, chronic pain, type 2 diabetes mellitus, hypothyroidism who presented [facility] ED on [facility]/29 with abdominal pain associate with nausea/vomiting.  Pain is localized to the periumbilical area.  Unable to ascertain to the numbers of vomiting episodes.  No reported history of gastroparesis, denies marijuana abuse.  Unclear trigger.  Multiple hospitalizations for the same, last 5/6 - 5/9 with possible viral gastroenteritis.   In the ED, temperature 98.7 F, HR 154, RR 20, BP 139/88, SPO2 95% on room air.  Sodium 146, potassium 4.3, chloride 106, CO2 16, glucose 222, BUN less than 5, BUN 1.24, lipase 19, AST 62, ALT 48, total bilirubin 0.8.  WBC 13.0, hemoglobin 18.0, platelets 457.  Urinalysis with moderate leukocytes, negative nitrite, few bacteria, 11-20 WBCs.  Chest x-ray with no evidence of acute cardiopulmonary disease process.  CT abdomen/pelvis with no acute findings within the abdomen/pelvis, stable appearance from the prior abdomen/pelvis with hepatic steatosis and minimal aortic atherosclerosis.  EKG with sinus tachycardia, rate 125, QTc 434 and no concerning dynamic changes.  He was given Reglan, Zofran, pain medication and 1 L IV fluid bolus.  Hospital service consulted for further evaluation and management for intractable nausea/vomiting with abdominal pain.    Assessment & Plan:   Principal Problem:   Cyclical vomiting syndrome Active Problems:   Intractable nausea and vomiting   T2DM (type 2 diabetes mellitus) ([place])   Barrett's esophagus   Essential hypertension   AKI (acute kidney injury) (H[facility])   Leukocytosis   Hyperlipidemia   Sinus tachycardia   Chronic pain  disorder   Depression with anxiety   Hypernatremia   Hypothyroidism   Hypokalemia   Class 2 obesity due to excess calories with body mass index (BMI) of 37.0 to 37.9 in adult   Assessment and Plan: Intractable nausea and vomiting CT without acute findings Scheduled reglan  Phenergan suppository prn Appreciate GI recommendations  ADAT Will need gastric emptying study outpatient   Barrett's esophagus Daily PPI, needs endoscopy outpatient with GI  AKI (acute kidney injury) ([place]) resolved  Leukocytosis improved  Sinus tachycardia Resolved with IVF  Hypernatremia improved  Class 2 obesity due to excess calories with body mass index (BMI) of 37.0 to 37.9 in adult Body mass index is 37.16 kg/m. noted  Hypokalemia improved          DVT prophylaxis: lovenox Code Status: full Family Communication: none Disposition:   Status is: Inpatient Remains inpatient appropriate because: pending further improvement, tolerance PO   Consultants:  GI  Procedures:  none  Antimicrobials:  Anti-infectives (From admission, onward)    None       Subjective: No new complaints  Objective: Vitals:   09/19/21 2014 09/20/21 0610 09/20/21 0800 09/20/21 1700  BP: (!) 160/101 (!) 131/96 133/72 118/76  Pulse: 74 78 91 82  Resp: '16 16 16 16  '$ Temp: 98.6 F (37 C) (!) 97.5 F (36.4 C) 98.7 F (37.1 C) 98.2 F (36.8 C)  TempSrc: Oral Oral Oral Oral  SpO2: 97% 97% 91% 97%  Weight:      Height:        Intake/Output Summary (Last 24 hours) at 09/20/2021 1925 Last data filed at 09/20/2021 [phone]  Gross per 24 hour  Intake 240 ml  Output --  Net 240 ml   Filed Weights   09/18/21 1305  Weight: 98.2 kg    Examination:  General exam: Appears calm and comfortable  Respiratory system: unlabored Cardiovascular system: RRR Gastrointestinal system: Abdomen is nondistended, soft and nontender.  Central nervous system: Alert and oriented. No focal neurological  deficits. Extremities: no LEE   Data Reviewed: I have personally reviewed following labs and imaging studies  CBC: Recent Labs  Lab 09/18/21 0820 09/20/21 0928  WBC 13.0* 7.9  NEUTROABS  --  4.7  HGB 18.0* 13.4  HCT 55.5* 40.6  MCV 93.4 93.3  PLT 457* 179    Basic Metabolic Panel: Recent Labs  Lab 09/18/21 0820 09/18/21 1811 09/20/21 0928  NA 146* 146* 141  K 4.3 3.1* 3.9  CL 106 110 110  CO2 16* 19* 25  GLUCOSE 222* 183* 92  BUN <5* <5* <5*  CREATININE 1.24* 1.01* 0.82  CALCIUM 10.1 9.0 7.8*  MG  --   --  1.4*    GFR: Estimated Creatinine Clearance: 93.4 mL/min (by C-G formula based on SCr of 0.82 mg/dL).  Liver Function Tests: Recent Labs  Lab 09/18/21 0820 09/20/21 0928  AST 62* 45*  ALT 48* 24  ALKPHOS 99 53  BILITOT 0.8 1.1  PROT 8.7* 5.5*  ALBUMIN 4.1 2.7*    CBG: Recent Labs  Lab 09/19/21 1556 09/19/21 2015 09/20/21 0726 09/20/21 1218 09/20/21 1722  GLUCAP 106* 89 100* 105* 133*     No results found for this or any previous visit (from the past 240 hour(s)).       Radiology Studies: No results found.      Scheduled Meds:  ARIPiprazole  5 mg Oral Daily   atorvastatin  10 mg Oral Daily   capsaicin   Topical BID   carvedilol  6.25 mg Oral BID WC   enoxaparin (LOVENOX) injection  40 mg Subcutaneous Q24H   FLUoxetine  40 mg Oral Daily   insulin aspart  0-15 Units Subcutaneous TID WC   insulin aspart  0-5 Units Subcutaneous QHS   levothyroxine  50 mcg Oral Daily   metoCLOPramide (REGLAN) injection  5 mg Intravenous Q8H   oxyCODONE-acetaminophen  1-2 tablet Oral 5 X Daily   pantoprazole (PROTONIX) IV  40 mg Intravenous Q24H   prazosin  2 mg Oral QHS   sodium chloride flush  3 mL Intravenous Q12H   [START ON 09/21/2021] traZODone  150 mg Oral QHS   Continuous Infusions:  lactated ringers 100 mL/hr at 09/20/21 0610     LOS: 1 day    Time spent: over 30 min    Fayrene Helper, MD [facility]   To contact  the attending provider between 7A-7P or the covering provider during after hours 7P-7A, please log into the web site [email or web address] and access using universal [place] password for that web site. If you do not have the password, please call the hospital operator.  09/20/2021, 7:25 PM

## 2021-09-20 NOTE — Assessment & Plan Note (Signed)
Body mass index is 37.16 kg/m. noted

## 2021-09-20 NOTE — Hospital Course (Signed)
[name] is [name] 51 y.o. female with past medical history significant for essential hypertension, depression/anxiety/PTSD, chronic pain, type 2 diabetes mellitus, hypothyroidism who presented to [facility] ED on 5/29 with abdominal pain associate with nausea/vomiting.  Pain is localized to the periumbilical area.  Unable to ascertain to the numbers of vomiting episodes.  No reported history of gastroparesis, denies marijuana abuse.  Unclear trigger.  Multiple hospitalizations for the same, last 5/6 - 5/9 with possible viral gastroenteritis.   In the ED, temperature 98.7 F, HR 154, RR 20, BP 139/88, SPO2 95% on room air.  Sodium 146, potassium 4.3, chloride 106, CO2 16, glucose 222, BUN less than 5, BUN 1.24, lipase 19, AST 62, ALT 48, total bilirubin 0.8.  WBC 13.0, hemoglobin 18.0, platelets 457.  Urinalysis with moderate leukocytes, negative nitrite, few bacteria, 11-20 WBCs.  Chest x-ray with no evidence of acute cardiopulmonary disease process.  CT abdomen/pelvis with no acute findings within the abdomen/pelvis, stable appearance from the prior abdomen/pelvis with hepatic steatosis and minimal aortic atherosclerosis.  EKG with sinus tachycardia, rate 125, QTc 434 and no concerning dynamic changes.  He was given Reglan, Zofran, pain medication and 1 L IV fluid bolus.  Hospital service consulted for further evaluation and management for intractable nausea/vomiting with abdominal pain.  She's gradually improved with scheduled reglan.  Plan at this point is for outpatient GI follow up, eventual gastric emptying study outpatient.  Further workup of nausea/vomiting outpatient with GI.  Needs eventual endoscopy outpatient for hx barrett's esophagus.  See below for additional details

## 2021-09-20 NOTE — Assessment & Plan Note (Signed)
Replace and follow. ?

## 2021-09-20 NOTE — Progress Notes (Signed)
    Progress Note   Assessment    51 year old female with history of GERD with short segment Barrett's esophagus without dysplasia, small nonadvanced adenomatous colonic polyps, IDA with distal small bowel AVM not bleeding at time of VCE in 2020, hypertension, hyperlipidemia and diabetes here with recurrent nausea and vomiting   Recommendations   Recurrent, episodic nausea and vomiting --improving with supportive care, IV metoclopramide and as needed ondansetron. --Continue supportive care and antiemetics --No plan to repeat upper endoscopy while here given improvement --Given the use of promotility agents I would hold off on gastric emptying scan but this is a very reasonable test as an outpatient --I will arrange follow-up with Dr. Tarri Glenn or one of our advanced practitioners --GI will sign off, call if questions  2.  Barrett's esophagus --continue once daily PPI and surveillance endoscopy later this year   Chief Complaint   Patient reports nausea and vomiting are better today.  She is tolerating clear liquids and wants to advance diet 1 episode of vomiting yesterday, nonbloody and non-melenic Abdominal pain better  Vital signs in last 24 hours: Temp:  [97.5 F (36.4 C)-98.7 F (37.1 C)] 98.7 F (37.1 C) (05/31 0800) Pulse Rate:  [74-91] 91 (05/31 0800) Resp:  [16-18] 16 (05/31 0800) BP: (131-164)/(72-101) 133/72 (05/31 0800) SpO2:  [91 %-100 %] 91 % (05/31 0800) Last BM Date : 09/18/21  General: Alert, well-developed, in NAD Heart:  Regular rate and rhythm; no murmurs Chest: Clear to ascultation bilaterally Abdomen:  Soft, obese, nontender and nondistended. Normal bowel sounds, without guarding, and without rebound.   Neurologic:  Alert and  oriented x4; grossly normal neurologically. Psych:  Alert and cooperative. Normal mood and affect.  Intake/Output from previous day: 05/30 0701 - 05/31 0700 In: 2168.4 [P.O.:240; I.V.:1928.4] Out: -  Intake/Output this  shift: No intake/output data recorded.  Lab Results: Recent Labs    09/18/21 0820 09/20/21 0928  WBC 13.0* 7.9  HGB 18.0* 13.4  HCT 55.5* 40.6  PLT 457* 161   BMET Recent Labs    09/18/21 0820 09/18/21 1811 09/20/21 0928  NA 146* 146* 141  K 4.3 3.1* 3.9  CL 106 110 110  CO2 16* 19* 25  GLUCOSE 222* 183* 92  BUN <5* <5* <5*  CREATININE 1.24* 1.01* 0.82  CALCIUM 10.1 9.0 7.8*   LFT Recent Labs    09/20/21 0928  PROT 5.5*  ALBUMIN 2.7*  AST 45*  ALT 24  ALKPHOS 53  BILITOT 1.1  Low   LOS: 1 day   Jerene Bears, MD 09/20/2021, 2:57 PM See Shea Evans, [facility], to contact our on call provider

## 2021-09-21 DIAGNOSIS — R1115 Cyclical vomiting syndrome unrelated to migraine: Secondary | ICD-10-CM | POA: Diagnosis not present

## 2021-09-21 LAB — COMPREHENSIVE METABOLIC PANEL
ALT: 23 U/L (ref 0–44)
AST: 27 U/L (ref 15–41)
Albumin: 2.4 g/dL — ABNORMAL LOW (ref 3.5–5.0)
Alkaline Phosphatase: 49 U/L (ref 38–126)
Anion gap: 6 (ref 5–15)
BUN: 5 mg/dL — ABNORMAL LOW (ref 6–20)
CO2: 26 mmol/L (ref 22–32)
Calcium: 7.7 mg/dL — ABNORMAL LOW (ref 8.9–10.3)
Chloride: 109 mmol/L (ref 98–111)
Creatinine, Ser: 0.7 mg/dL (ref 0.44–1.00)
GFR, Estimated: >60 mL/min (ref >60)
Glucose, Bld: 103 mg/dL — ABNORMAL HIGH (ref 70–99)
Potassium: 3.2 mmol/L — ABNORMAL LOW (ref 3.5–5.1)
Sodium: 141 mmol/L (ref 135–145)
Total Bilirubin: 0.5 mg/dL (ref 0.3–1.2)
Total Protein: 5 g/dL — ABNORMAL LOW (ref 6.5–8.1)

## 2021-09-21 LAB — CBC WITH DIFFERENTIAL/PLATELET
Abs Immature Granulocytes: 0.01 10*3/uL (ref 0.00–0.07)
Basophils Absolute: 0.1 10*3/uL (ref 0.0–0.1)
Basophils Relative: 1 %
Eosinophils Absolute: 0.4 10*3/uL (ref 0.0–0.5)
Eosinophils Relative: 8 %
HCT: 37 % (ref 36.0–46.0)
Hemoglobin: 11.9 g/dL — ABNORMAL LOW (ref 12.0–15.0)
Immature Granulocytes: 0 %
Lymphocytes Relative: 34 %
Lymphs Abs: 1.8 10*3/uL (ref 0.7–4.0)
MCH: 30.4 pg (ref 26.0–34.0)
MCHC: 32.2 g/dL (ref 30.0–36.0)
MCV: 94.6 fL (ref 80.0–100.0)
Monocytes Absolute: 0.4 10*3/uL (ref 0.1–1.0)
Monocytes Relative: 8 %
Neutro Abs: 2.7 10*3/uL (ref 1.7–7.7)
Neutrophils Relative %: 49 %
Platelets: 182 10*3/uL (ref 150–400)
RBC: 3.91 MIL/uL (ref 3.87–5.11)
RDW: 14 % (ref 11.5–15.5)
WBC: 5.4 10*3/uL (ref 4.0–10.5)
nRBC: 0 % (ref 0.0–0.2)

## 2021-09-21 LAB — GLUCOSE, CAPILLARY: Glucose-Capillary: 74 mg/dL (ref 70–99)

## 2021-09-21 LAB — PHOSPHORUS: Phosphorus: 3.3 mg/dL (ref 2.5–4.6)

## 2021-09-21 LAB — MAGNESIUM: Magnesium: 2.4 mg/dL (ref 1.7–2.4)

## 2021-09-21 MED ORDER — METOCLOPRAMIDE HCL 5 MG PO TABS: 5.0000 mg | ORAL_TABLET | Freq: Three times a day (TID) | ORAL | 0 refills | Status: DC

## 2021-09-21 MED ORDER — TRAZODONE HCL 50 MG PO TABS: 150.0000 mg | ORAL_TABLET | Freq: Every day | ORAL | Status: DC

## 2021-09-21 MED ORDER — METOCLOPRAMIDE HCL 5 MG PO TABS
5.0000 mg | ORAL_TABLET | Freq: Three times a day (TID) | ORAL | Status: DC
Start: 2021-09-21 — End: 2021-09-21

## 2021-09-21 MED ORDER — POTASSIUM CHLORIDE CRYS ER 20 MEQ PO TBCR
40.0000 meq | EXTENDED_RELEASE_TABLET | Freq: Once | ORAL | Status: AC
  Administered 2021-09-21: 40 meq via ORAL
  Filled 2021-09-21: qty 2

## 2021-09-21 MED ORDER — PANTOPRAZOLE SODIUM 40 MG PO TBEC: 40.0000 mg | DELAYED_RELEASE_TABLET | Freq: Every day | ORAL | 1 refills | Status: DC

## 2021-09-21 NOTE — Assessment & Plan Note (Signed)
a1c 08/01/21 7.2 Continue metformin at discharge

## 2021-09-21 NOTE — Discharge Summary (Addendum)
Physician Discharge Summary  [name] [facility]:[id number] DOB: 09/09/1970 DOA: 09/18/2021  PCP: Ferd Hibbs, NP  Admit date: 09/18/2021 Discharge date: 09/21/2021  Time spent: 40 minutes  Recommendations for Outpatient Follow-up:  Follow outpatient CBC/CMP  Follow symptoms outpatient - needs additional workup with GI - consider gastric emptying study Needs EGD with GI for Barrett's esophagus Continue PPI for Barrett's   Discharge Diagnoses:  Principal Problem:   Cyclical vomiting syndrome Active Problems:   Intractable nausea and vomiting   T2DM (type 2 diabetes mellitus) ([place])   Barrett's esophagus   Essential hypertension   AKI (acute kidney injury) ([place])   Leukocytosis   Hyperlipidemia   Sinus tachycardia   Chronic pain disorder   Depression with anxiety   Hypernatremia   Hypothyroidism   Hypokalemia   Hypomagnesemia   Class 2 obesity due to excess calories with body mass index (BMI) of 37.0 to 37.9 in adult   Discharge Condition: stable  Diet recommendation: heart healthy, diabetic  Filed Weights   09/18/21 1305  Weight: 98.2 kg    History of present illness:  [name] is [name] 51 y.o. female with past medical history significant for essential hypertension, depression/anxiety/PTSD, chronic pain, type 2 diabetes mellitus, hypothyroidism who presented to [facility] ED on 5/29 with abdominal pain associate with nausea/vomiting.  Pain is localized to the periumbilical area.  Unable to ascertain to the numbers of vomiting episodes.  No reported history of gastroparesis, denies marijuana abuse.  Unclear trigger.  Multiple hospitalizations for the same, last 5/6 - 5/9 with possible viral gastroenteritis.   In the ED, temperature 98.7 F, HR 154, RR 20, BP 139/88, SPO2 95% on room air.  Sodium 146, potassium 4.3, chloride 106, CO2 16, glucose 222, BUN less than 5, BUN 1.24, lipase 19, AST 62, ALT 48, total bilirubin 0.8.  WBC 13.0, hemoglobin 18.0, platelets 457.  Urinalysis  with moderate leukocytes, negative nitrite, few bacteria, 11-20 WBCs.  Chest x-ray with no evidence of acute cardiopulmonary disease process.  CT abdomen/pelvis with no acute findings within the abdomen/pelvis, stable appearance from the prior abdomen/pelvis with hepatic steatosis and minimal aortic atherosclerosis.  EKG with sinus tachycardia, rate 125, QTc 434 and no concerning dynamic changes.  He was given Reglan, Zofran, pain medication and 1 L IV fluid bolus.  Hospital service consulted for further evaluation and management for intractable nausea/vomiting with abdominal pain.  She's gradually improved with scheduled reglan.  Plan at this point is for outpatient GI follow up, eventual gastric emptying study outpatient.  Further workup of nausea/vomiting outpatient with GI.  Needs eventual endoscopy outpatient for hx barrett's esophagus.  See below for additional details  Hospital Course:  Assessment and Plan: Intractable nausea and vomiting CT without acute findings Phenergan suppository prn Appreciate GI recommendations  Doing well today, did well with breakfast - continue short course of reglan outpatient, follow with GI for eventual gastric emptying study, additional workup    Barrett's esophagus Daily PPI, needs endoscopy outpatient with GI  T2DM (type 2 diabetes mellitus) ([place]) a1c 08/01/21 7.2 Continue metformin at discharge  AKI (acute kidney injury) ([place]) resolved  Essential hypertension Continue coreg  Leukocytosis improved  Sinus tachycardia Resolved with IVF  Hypernatremia improved  Depression with anxiety Trazodone, prozac, abilify Will check qtc prior to d/c  Class 2 obesity due to excess calories with body mass index (BMI) of 37.0 to 37.9 in adult Body mass index is 37.16 kg/m. noted  Hypomagnesemia Replace and follow  Hypokalemia improved  Procedures: none   Consultations: GI  Discharge Exam: Vitals:   09/21/21 0600 09/21/21  0803  BP: 136/69 134/72  Pulse: 77 76  Resp: 17 17  Temp: 97.9 F (36.6 C) 97.6 F (36.4 C)  SpO2: 96% 98%   Did ok with breakfast Comfortable d/c'ing today No additional complaints  General: No acute distress. Cardiovascular: RRR Lungs: unlabored Abdomen: Soft, nontender, nondistended  Neurological: Alert and oriented 3. Moves all extremities 4 with equal strength. Cranial nerves II through XII grossly intact. Extremities: No clubbing or cyanosis. No edema.  Discharge Instructions   Discharge Instructions     Call MD for:  difficulty breathing, headache or visual disturbances   Complete by: As directed    Call MD for:  extreme fatigue   Complete by: As directed    Call MD for:  hives   Complete by: As directed    Call MD for:  persistant dizziness or light-headedness   Complete by: As directed    Call MD for:  persistant nausea and vomiting   Complete by: As directed    Call MD for:  redness, tenderness, or signs of infection (pain, swelling, redness, odor or green/yellow discharge around incision site)   Complete by: As directed    Call MD for:  severe uncontrolled pain   Complete by: As directed    Call MD for:  temperature >100.4   Complete by: As directed    Diet - low sodium heart healthy   Complete by: As directed    Discharge instructions   Complete by: As directed    You were seen for nausea, vomiting, and abdominal pain.  You were improved with reglan.  We'll discharge you with another 5 days worth.  You should follow up with gastroenterology for additional workup.  You'll need Tarick Parenteau gastric emptying study at some point.  Eat smaller, more frequent meals.    You should be on protonix daily for your barrett's esophagus.  Follow up with gastroenterology for an EGD as an outpatient for this.  Return for new, recurrent, or worsening symptoms.  Please ask your PCP to request records from this hospitalization so they know what was done and what the next steps  will be.   Increase activity slowly   Complete by: As directed    No wound care   Complete by: As directed       Allergies as of 09/21/2021   No Known Allergies      Medication List     STOP taking these medications    omeprazole 20 MG capsule Commonly known as: PRILOSEC       TAKE these medications    acetaminophen 325 MG tablet Commonly known as: TYLENOL Take 2 tablets (650 mg total) by mouth every 6 (six) hours as needed for mild pain, fever or headache. What changed: when to take this   ARIPiprazole 5 MG tablet Commonly known as: ABILIFY Take 1 tablet (5 mg total) by mouth daily.   atorvastatin 10 MG tablet Commonly known as: LIPITOR Take 10 mg by mouth daily.   BENADRYL PO Take 3 tablets by mouth in the morning and at bedtime.   blood glucose meter kit and supplies Kit Dispense based on patient and insurance preference. Use up to four times daily as directed.   carvedilol 6.25 MG tablet Commonly known as: COREG Take 1 tablet (6.25 mg total) by mouth 2 (two) times daily with Antonae Zbikowski meal.   DIARRHEA PO Take 2-3 tablets by  mouth See admin instructions. Take 2-3 tablets by mouth as needed for diarrhea. Unknown frequency   FLUoxetine 40 MG capsule Commonly known as: PROZAC Take 1 capsule (40 mg total) by mouth daily.   levothyroxine 50 MCG tablet Commonly known as: SYNTHROID Take 50 mcg by mouth daily.   metFORMIN 500 MG tablet Commonly known as: GLUCOPHAGE Take 500 mg by mouth 2 (two) times daily with Marifer Hurd meal.   metoCLOPramide 5 MG tablet Commonly known as: Reglan Take 1 tablet (5 mg total) by mouth 3 (three) times daily before meals for 5 days.   multivitamin Tabs tablet Take 1 tablet by mouth at bedtime.   ondansetron 4 MG disintegrating tablet Commonly known as: ZOFRAN-ODT Take 1 tablet (4 mg total) by mouth every 8 (eight) hours as needed for nausea or vomiting. What changed: when to take this   oxyCODONE-acetaminophen 7.5-325 MG  tablet Commonly known as: PERCOCET Take 1 tablet by mouth See admin instructions. Take 7.5 - 325 mg (1-2 tab) 5- times daily   pantoprazole 40 MG tablet Commonly known as: Protonix Take 1 tablet (40 mg total) by mouth daily.   prazosin 2 MG capsule Commonly known as: MINIPRESS Take 1 capsule (2 mg total) by mouth at bedtime.   promethazine 25 MG tablet Commonly known as: PHENERGAN Take 25 mg by mouth 2 (two) times daily as needed for nausea or vomiting.   tiZANidine 4 MG capsule Commonly known as: ZANAFLEX Take 12 mg by mouth See admin instructions. 12 mg by mouth 3-5 times Capri Raben day   traZODone 50 MG tablet Commonly known as: DESYREL Take 3 tablets (150 mg total) by mouth at bedtime. What changed: when to take this       No Known Allergies    The results of significant diagnostics from this hospitalization (including imaging, microbiology, ancillary and laboratory) are listed below for reference.    Significant Diagnostic Studies: CT ABDOMEN PELVIS W CONTRAST  Result Date: 09/18/2021 CLINICAL DATA:  Diffuse abdominal pain. EXAM: CT ABDOMEN AND PELVIS WITH CONTRAST TECHNIQUE: Multidetector CT imaging of the abdomen and pelvis was performed using the standard protocol following bolus administration of intravenous contrast. RADIATION DOSE REDUCTION: This exam was performed according to the departmental dose-optimization program which includes automated exposure control, adjustment of the mA and/or kV according to patient size and/or use of iterative reconstruction technique. CONTRAST:  126m OMNIPAQUE IOHEXOL 300 MG/ML  SOLN COMPARISON:  08/27/2021. FINDINGS: Lower chest: Clear lung bases. Hepatobiliary: Decreased liver attenuation consistent with fatty infiltration. Normal liver size. No mass or focal lesion. Normal gallbladder. No bile duct dilation. Pancreas: Unremarkable. No pancreatic ductal dilatation or surrounding inflammatory changes. Spleen: Normal in size without focal  abnormality. Adrenals/Urinary Tract: Adrenal glands are unremarkable. Kidneys are normal, without renal calculi, focal lesion, or hydronephrosis. Bladder is unremarkable. Stomach/Bowel: Stomach is within normal limits. Appendix appears normal. No evidence of bowel wall thickening, distention, or inflammatory changes. Vascular/Lymphatic: Minimal aortic atherosclerosis. No aneurysm. Vasculature otherwise unremarkable. No enlarged lymph nodes. Reproductive: Uterus and bilateral adnexa are unremarkable. Other: No abdominal wall hernia or abnormality. No abdominopelvic ascites. Musculoskeletal: No fracture or acute finding.  No bone lesion. IMPRESSION: 1. No acute findings within the abdomen or pelvis. Stable appearance from the prior abdomen and pelvis CT. 2. Hepatic steatosis. 3. Minimal aortic atherosclerosis. Electronically Signed   By: DLajean ManesM.D.   On: 09/18/2021 10:25   CT ABDOMEN PELVIS W CONTRAST  Result Date: 08/27/2021 CLINICAL DATA:  Abdominal pain with nausea, vomiting  and diarrhea. EXAM: CT ABDOMEN AND PELVIS WITH CONTRAST TECHNIQUE: Multidetector CT imaging of the abdomen and pelvis was performed using the standard protocol following bolus administration of intravenous contrast. RADIATION DOSE REDUCTION: This exam was performed according to the departmental dose-optimization program which includes automated exposure control, adjustment of the mA and/or kV according to patient size and/or use of iterative reconstruction technique. CONTRAST:  154m OMNIPAQUE IOHEXOL 350 MG/ML SOLN COMPARISON:  August 01, 2021 FINDINGS: Lower chest: No acute abnormality. Hepatobiliary: There is diffuse fatty infiltration of the liver parenchyma. No focal liver abnormality is seen. No gallstones, gallbladder wall thickening, or biliary dilatation. Pancreas: Unremarkable. No pancreatic ductal dilatation or surrounding inflammatory changes. Spleen: Normal in size without focal abnormality. Adrenals/Urinary Tract:  Adrenal glands are unremarkable. Kidneys are normal, without renal calculi, focal lesion, or hydronephrosis. Bladder is unremarkable. Stomach/Bowel: Rizwan Kuyper small, stable left diaphragmatic hernia is seen which contains Alilah Mcmeans small portion of the gastric fundus. Appendix appears normal. No evidence of bowel wall thickening, distention, or inflammatory changes. Vascular/Lymphatic: Aortic atherosclerosis. No enlarged abdominal or pelvic lymph nodes. Reproductive: Uterus and bilateral adnexa are unremarkable. Other: No abdominal wall hernia or abnormality. No abdominopelvic ascites. Musculoskeletal: No acute or significant osseous findings. IMPRESSION: 1. No acute or active process within the abdomen or pelvis. 2. Hepatic steatosis. 3. Aortic atherosclerosis. Aortic Atherosclerosis (ICD10-I70.0). Electronically Signed   By: TVirgina NorfolkM.D.   On: 08/27/2021 01:54   DG Chest Portable 1 View  Result Date: 09/18/2021 CLINICAL DATA:  Chest pain EXAM: PORTABLE CHEST 1 VIEW COMPARISON:  Radiograph 06/11/2020 FINDINGS: The cardiomediastinal silhouette is within normal limits. There is no focal airspace consolidation. There is no pleural effusion. No pneumothorax. There is no acute osseous abnormality. IMPRESSION: No evidence of acute cardiopulmonary disease. Electronically Signed   By: JMaurine SimmeringM.D.   On: 09/18/2021 09:11    Microbiology: No results found for this or any previous visit (from the past 240 hour(s)).   Labs: Basic Metabolic Panel: Recent Labs  Lab 09/18/21 0820 09/18/21 1811 09/20/21 0928 09/21/21 0303  NA 146* 146* 141 141  K 4.3 3.1* 3.9 3.2*  CL 106 110 110 109  CO2 16* 19* 25 26  GLUCOSE 222* 183* 92 103*  BUN <5* <5* <5* 5*  CREATININE 1.24* 1.01* 0.82 0.70  CALCIUM 10.1 9.0 7.8* 7.7*  MG  --   --  1.4* 2.4  PHOS  --   --   --  3.3   Liver Function Tests: Recent Labs  Lab 09/18/21 0820 09/20/21 0928 09/21/21 0303  AST 62* 45* 27  ALT 48* 24 23  ALKPHOS 99 53 49  BILITOT  0.8 1.1 0.5  PROT 8.7* 5.5* 5.0*  ALBUMIN 4.1 2.7* 2.4*   Recent Labs  Lab 09/18/21 0820  LIPASE 19   No results for input(s): AMMONIA in the last 168 hours. CBC: Recent Labs  Lab 09/18/21 0820 09/20/21 0928 09/21/21 0303  WBC 13.0* 7.9 5.4  NEUTROABS  --  4.7 2.7  HGB 18.0* 13.4 11.9*  HCT 55.5* 40.6 37.0  MCV 93.4 93.3 94.6  PLT 457* 161 182   Cardiac Enzymes: No results for input(s): CKTOTAL, CKMB, CKMBINDEX, TROPONINI in the last 168 hours. BNP: BNP (last 3 results) No results for input(s): BNP in the last 8760 hours.  ProBNP (last 3 results) No results for input(s): PROBNP in the last 8760 hours.  CBG: Recent Labs  Lab 09/20/21 0726 09/20/21 1218 09/20/21 1722 09/20/21 2031 09/21/21 0759  GLUCAP  100* 105* 133* 95 74       Signed:  Fayrene Helper MD.  [facility] 09/21/2021, 10:04 AM

## 2021-09-21 NOTE — Assessment & Plan Note (Signed)
Continue coreg.

## 2021-09-21 NOTE — Progress Notes (Signed)
Discharge instructions reviewed with pt and instructed on where to pick up prescriptions. Pt verbalized understanding and had no questions.  Pt discharged in stable condition via wheelchair with family.  [name]

## 2021-09-21 NOTE — Assessment & Plan Note (Addendum)
Trazodone, prozac, abilify Will check qtc prior to d/c

## 2021-09-28 ENCOUNTER — Telehealth: Payer: Self-pay | Admitting: Nutrition

## 2021-10-02 NOTE — Telephone Encounter (Signed)
Message left that I needed to reschedule our appoinmtment.  Number given to call me back

## 2021-10-03 ENCOUNTER — Inpatient Hospital Stay (HOSPITAL_COMMUNITY)
Admission: EM | Admit: 2021-10-03 | Discharge: 2021-10-06 | DRG: 074 | Disposition: A | Discharge status: Home / Self Care | Payer: Medicare Other | Attending: Internal Medicine | Admitting: Internal Medicine

## 2021-10-03 ENCOUNTER — Emergency Department (HOSPITAL_COMMUNITY): Payer: Medicare Other

## 2021-10-03 ENCOUNTER — Other Ambulatory Visit: Payer: Self-pay

## 2021-10-03 DIAGNOSIS — E876 Hypokalemia: Secondary | ICD-10-CM | POA: Diagnosis present

## 2021-10-03 DIAGNOSIS — E860 Dehydration: Secondary | ICD-10-CM | POA: Diagnosis present

## 2021-10-03 DIAGNOSIS — F1120 Opioid dependence, uncomplicated: Secondary | ICD-10-CM | POA: Diagnosis present

## 2021-10-03 DIAGNOSIS — M5030 Other cervical disc degeneration, unspecified cervical region: Secondary | ICD-10-CM | POA: Diagnosis present

## 2021-10-03 DIAGNOSIS — E785 Hyperlipidemia, unspecified: Secondary | ICD-10-CM | POA: Diagnosis present

## 2021-10-03 DIAGNOSIS — Z79899 Other long term (current) drug therapy: Secondary | ICD-10-CM

## 2021-10-03 DIAGNOSIS — Z803 Family history of malignant neoplasm of breast: Secondary | ICD-10-CM

## 2021-10-03 DIAGNOSIS — F4312 Post-traumatic stress disorder, chronic: Secondary | ICD-10-CM | POA: Diagnosis present

## 2021-10-03 DIAGNOSIS — E872 Acidosis, unspecified: Secondary | ICD-10-CM | POA: Diagnosis present

## 2021-10-03 DIAGNOSIS — I10 Essential (primary) hypertension: Secondary | ICD-10-CM | POA: Diagnosis present

## 2021-10-03 DIAGNOSIS — G894 Chronic pain syndrome: Secondary | ICD-10-CM | POA: Diagnosis present

## 2021-10-03 DIAGNOSIS — E6609 Other obesity due to excess calories: Secondary | ICD-10-CM | POA: Diagnosis present

## 2021-10-03 DIAGNOSIS — E039 Hypothyroidism, unspecified: Secondary | ICD-10-CM | POA: Diagnosis present

## 2021-10-03 DIAGNOSIS — Z8371 Family history of colonic polyps: Secondary | ICD-10-CM

## 2021-10-03 DIAGNOSIS — K449 Diaphragmatic hernia without obstruction or gangrene: Secondary | ICD-10-CM | POA: Diagnosis present

## 2021-10-03 DIAGNOSIS — E66812 Obesity, class 2: Secondary | ICD-10-CM

## 2021-10-03 DIAGNOSIS — E119 Type 2 diabetes mellitus without complications: Secondary | ICD-10-CM

## 2021-10-03 DIAGNOSIS — M159 Polyosteoarthritis, unspecified: Secondary | ICD-10-CM | POA: Diagnosis present

## 2021-10-03 DIAGNOSIS — R112 Nausea with vomiting, unspecified: Secondary | ICD-10-CM | POA: Diagnosis present

## 2021-10-03 DIAGNOSIS — E1143 Type 2 diabetes mellitus with diabetic autonomic (poly)neuropathy: Secondary | ICD-10-CM | POA: Diagnosis not present

## 2021-10-03 DIAGNOSIS — Z6837 Body mass index (BMI) 37.0-37.9, adult: Secondary | ICD-10-CM

## 2021-10-03 DIAGNOSIS — Z765 Malingerer [conscious simulation]: Secondary | ICD-10-CM

## 2021-10-03 DIAGNOSIS — N179 Acute kidney failure, unspecified: Secondary | ICD-10-CM | POA: Diagnosis present

## 2021-10-03 DIAGNOSIS — K760 Fatty (change of) liver, not elsewhere classified: Secondary | ICD-10-CM | POA: Diagnosis present

## 2021-10-03 DIAGNOSIS — K3184 Gastroparesis: Secondary | ICD-10-CM | POA: Diagnosis present

## 2021-10-03 DIAGNOSIS — Z7984 Long term (current) use of oral hypoglycemic drugs: Secondary | ICD-10-CM

## 2021-10-03 DIAGNOSIS — Z79890 Hormone replacement therapy (postmenopausal): Secondary | ICD-10-CM

## 2021-10-03 DIAGNOSIS — Z833 Family history of diabetes mellitus: Secondary | ICD-10-CM

## 2021-10-03 LAB — CBC WITH DIFFERENTIAL/PLATELET
Abs Immature Granulocytes: 0.1 10*3/uL — ABNORMAL HIGH (ref 0.00–0.07)
Basophils Absolute: 0.1 10*3/uL (ref 0.0–0.1)
Basophils Relative: 1 %
Eosinophils Absolute: 0.2 10*3/uL (ref 0.0–0.5)
Eosinophils Relative: 1 %
HCT: 50.6 % — ABNORMAL HIGH (ref 36.0–46.0)
Hemoglobin: 16.8 g/dL — ABNORMAL HIGH (ref 12.0–15.0)
Immature Granulocytes: 1 %
Lymphocytes Relative: 10 %
Lymphs Abs: 1.8 10*3/uL (ref 0.7–4.0)
MCH: 30.4 pg (ref 26.0–34.0)
MCHC: 33.2 g/dL (ref 30.0–36.0)
MCV: 91.7 fL (ref 80.0–100.0)
Monocytes Absolute: 1 10*3/uL (ref 0.1–1.0)
Monocytes Relative: 5 %
Neutro Abs: 15 10*3/uL — ABNORMAL HIGH (ref 1.7–7.7)
Neutrophils Relative %: 82 %
Platelets: 536 10*3/uL — ABNORMAL HIGH (ref 150–400)
RBC: 5.52 MIL/uL — ABNORMAL HIGH (ref 3.87–5.11)
RDW: 13.5 % (ref 11.5–15.5)
WBC: 18.2 10*3/uL — ABNORMAL HIGH (ref 4.0–10.5)
nRBC: 0 % (ref 0.0–0.2)

## 2021-10-03 LAB — COMPREHENSIVE METABOLIC PANEL
ALT: 35 U/L (ref 0–44)
AST: 57 U/L — ABNORMAL HIGH (ref 15–41)
Albumin: 3.9 g/dL (ref 3.5–5.0)
Alkaline Phosphatase: 92 U/L (ref 38–126)
Anion gap: 20 — ABNORMAL HIGH (ref 5–15)
BUN: 10 mg/dL (ref 6–20)
CO2: 16 mmol/L — ABNORMAL LOW (ref 22–32)
Calcium: 9.8 mg/dL (ref 8.9–10.3)
Chloride: 100 mmol/L (ref 98–111)
Creatinine, Ser: 1.21 mg/dL — ABNORMAL HIGH (ref 0.44–1.00)
GFR, Estimated: 55 mL/min — ABNORMAL LOW (ref >60)
Glucose, Bld: 184 mg/dL — ABNORMAL HIGH (ref 70–99)
Potassium: 4.6 mmol/L (ref 3.5–5.1)
Sodium: 136 mmol/L (ref 135–145)
Total Bilirubin: 1.3 mg/dL — ABNORMAL HIGH (ref 0.3–1.2)
Total Protein: 8.3 g/dL — ABNORMAL HIGH (ref 6.5–8.1)

## 2021-10-03 LAB — I-STAT VENOUS BLOOD GAS, ED
Acid-base deficit: 6 mmol/L — ABNORMAL HIGH (ref 0.0–2.0)
Bicarbonate: 18.3 mmol/L — ABNORMAL LOW (ref 20.0–28.0)
Calcium, Ion: 0.93 mmol/L — ABNORMAL LOW (ref 1.15–1.40)
HCT: 52 % — ABNORMAL HIGH (ref 36.0–46.0)
Hemoglobin: 17.7 g/dL — ABNORMAL HIGH (ref 12.0–15.0)
O2 Saturation: 100 %
Potassium: 4.8 mmol/L (ref 3.5–5.1)
Sodium: 134 mmol/L — ABNORMAL LOW (ref 135–145)
TCO2: 19 mmol/L — ABNORMAL LOW (ref 22–32)
pCO2, Ven: 32.9 mmHg — ABNORMAL LOW (ref 44–60)
pH, Ven: 7.353 (ref 7.25–7.43)
pO2, Ven: 189 mmHg — ABNORMAL HIGH (ref 32–45)

## 2021-10-03 LAB — I-STAT BETA HCG BLOOD, ED (MC, WL, AP ONLY): I-stat hCG, quantitative: <5 m[IU]/mL (ref <5)

## 2021-10-03 LAB — CBG MONITORING, ED: Glucose-Capillary: 192 mg/dL — ABNORMAL HIGH (ref 70–99)

## 2021-10-03 LAB — GLUCOSE, CAPILLARY
Glucose-Capillary: 189 mg/dL — ABNORMAL HIGH (ref 70–99)
Glucose-Capillary: 150 mg/dL — ABNORMAL HIGH (ref 70–99)

## 2021-10-03 LAB — LIPASE, BLOOD: Lipase: 18 U/L (ref 11–51)

## 2021-10-03 MED ORDER — ACETAMINOPHEN 325 MG PO TABS: 650.0000 mg | ORAL_TABLET | Freq: Four times a day (QID) | ORAL | Status: DC | PRN

## 2021-10-03 MED ORDER — ARIPIPRAZOLE 5 MG PO TABS
5.0000 mg | ORAL_TABLET | Freq: Every day | ORAL | Status: DC
Start: 2021-10-03 — End: 2021-10-06
  Administered 2021-10-04 – 2021-10-06 (×3): 5 mg via ORAL
  Filled 2021-10-03 (×3): qty 1

## 2021-10-03 MED ORDER — OXYCODONE-ACETAMINOPHEN 7.5-325 MG PO TABS
1.0000 | ORAL_TABLET | ORAL | Status: DC
Start: 2021-10-03 — End: 2021-10-03

## 2021-10-03 MED ORDER — INSULIN ASPART 100 UNIT/ML IJ SOLN
0.0000 [IU] | Freq: Three times a day (TID) | INTRAMUSCULAR | Status: DC
  Administered 2021-10-03: 3 [IU] via SUBCUTANEOUS
  Administered 2021-10-04: 2 [IU] via SUBCUTANEOUS

## 2021-10-03 MED ORDER — ATORVASTATIN CALCIUM 10 MG PO TABS
10.0000 mg | ORAL_TABLET | Freq: Every day | ORAL | Status: DC
  Administered 2021-10-04 – 2021-10-06 (×3): 10 mg via ORAL
  Filled 2021-10-03 (×3): qty 1

## 2021-10-03 MED ORDER — LACTATED RINGERS IV SOLN: INTRAVENOUS | Status: DC

## 2021-10-03 MED ORDER — ONDANSETRON HCL 4 MG PO TABS
4.0000 mg | ORAL_TABLET | Freq: Four times a day (QID) | ORAL | Status: DC | PRN
  Administered 2021-10-05: 4 mg via ORAL
  Filled 2021-10-03: qty 1

## 2021-10-03 MED ORDER — SODIUM CHLORIDE 0.9% FLUSH
3.0000 mL | Freq: Two times a day (BID) | INTRAVENOUS | Status: DC
  Administered 2021-10-03 – 2021-10-06 (×5): 3 mL via INTRAVENOUS

## 2021-10-03 MED ORDER — LACTATED RINGERS IV BOLUS
500.0000 mL | Freq: Once | INTRAVENOUS | Status: AC
  Administered 2021-10-03: 500 mL via INTRAVENOUS

## 2021-10-03 MED ORDER — SODIUM CHLORIDE 0.9 % IV BOLUS
1000.0000 mL | Freq: Once | INTRAVENOUS | Status: AC
  Administered 2021-10-03: 1000 mL via INTRAVENOUS

## 2021-10-03 MED ORDER — ACETAMINOPHEN 650 MG RE SUPP: 650.0000 mg | Freq: Four times a day (QID) | RECTAL | Status: DC | PRN

## 2021-10-03 MED ORDER — HALOPERIDOL LACTATE 5 MG/ML IJ SOLN
2.0000 mg | Freq: Once | INTRAMUSCULAR | Status: AC
  Administered 2021-10-03: 2 mg via INTRAVENOUS
  Filled 2021-10-03: qty 1

## 2021-10-03 MED ORDER — PANTOPRAZOLE SODIUM 40 MG IV SOLR
40.0000 mg | Freq: Every day | INTRAVENOUS | Status: DC
  Administered 2021-10-03 – 2021-10-06 (×4): 40 mg via INTRAVENOUS
  Filled 2021-10-03 (×4): qty 10

## 2021-10-03 MED ORDER — ONDANSETRON HCL 4 MG/2ML IJ SOLN
4.0000 mg | Freq: Once | INTRAMUSCULAR | Status: AC
  Administered 2021-10-03: 4 mg via INTRAVENOUS
  Filled 2021-10-03: qty 2

## 2021-10-03 MED ORDER — IOHEXOL 300 MG/ML  SOLN
100.0000 mL | Freq: Once | INTRAMUSCULAR | Status: AC | PRN
  Administered 2021-10-03: 100 mL via INTRAVENOUS

## 2021-10-03 MED ORDER — METOCLOPRAMIDE HCL 5 MG/ML IJ SOLN
10.0000 mg | Freq: Four times a day (QID) | INTRAMUSCULAR | Status: DC
  Administered 2021-10-03 – 2021-10-06 (×12): 10 mg via INTRAVENOUS
  Filled 2021-10-03 (×12): qty 2

## 2021-10-03 MED ORDER — ENOXAPARIN SODIUM 40 MG/0.4ML IJ SOSY
40.0000 mg | PREFILLED_SYRINGE | Freq: Every day | INTRAMUSCULAR | Status: DC
  Administered 2021-10-03 – 2021-10-06 (×3): 40 mg via SUBCUTANEOUS
  Filled 2021-10-03 (×3): qty 0.4

## 2021-10-03 MED ORDER — PRAZOSIN HCL 2 MG PO CAPS
2.0000 mg | ORAL_CAPSULE | Freq: Every day | ORAL | Status: DC
  Administered 2021-10-04 – 2021-10-05 (×2): 2 mg via ORAL
  Filled 2021-10-03 (×3): qty 1

## 2021-10-03 MED ORDER — DOCUSATE SODIUM 100 MG PO CAPS
100.0000 mg | ORAL_CAPSULE | Freq: Two times a day (BID) | ORAL | Status: DC
  Administered 2021-10-04 – 2021-10-06 (×4): 100 mg via ORAL
  Filled 2021-10-03 (×6): qty 1

## 2021-10-03 MED ORDER — CARVEDILOL 6.25 MG PO TABS
6.2500 mg | ORAL_TABLET | Freq: Two times a day (BID) | ORAL | Status: DC
  Filled 2021-10-03 (×2): qty 1

## 2021-10-03 MED ORDER — BISACODYL 5 MG PO TBEC: 5.0000 mg | DELAYED_RELEASE_TABLET | Freq: Every day | ORAL | Status: DC | PRN

## 2021-10-03 MED ORDER — CAPSAICIN 0.075 % EX CREA
TOPICAL_CREAM | Freq: Two times a day (BID) | CUTANEOUS | Status: DC
Start: 2021-10-03 — End: 2021-10-06
  Filled 2021-10-03: qty 60

## 2021-10-03 MED ORDER — METOCLOPRAMIDE HCL 5 MG/ML IJ SOLN
10.0000 mg | Freq: Once | INTRAMUSCULAR | Status: AC
  Administered 2021-10-03: 10 mg via INTRAVENOUS
  Filled 2021-10-03: qty 2

## 2021-10-03 MED ORDER — HYDROCODONE-ACETAMINOPHEN 7.5-325 MG PO TABS
1.0000 | ORAL_TABLET | Freq: Every day | ORAL | Status: DC
  Administered 2021-10-03 – 2021-10-06 (×14): 1 via ORAL
  Filled 2021-10-03 (×15): qty 1

## 2021-10-03 MED ORDER — HYDRALAZINE HCL 20 MG/ML IJ SOLN
5.0000 mg | INTRAMUSCULAR | Status: DC | PRN
  Administered 2021-10-03: 5 mg via INTRAVENOUS
  Filled 2021-10-03: qty 1

## 2021-10-03 MED ORDER — INSULIN ASPART 100 UNIT/ML IJ SOLN: 0.0000 [IU] | Freq: Every day | INTRAMUSCULAR | Status: DC

## 2021-10-03 MED ORDER — LORAZEPAM 2 MG/ML IJ SOLN
1.0000 mg | Freq: Once | INTRAMUSCULAR | Status: AC
  Administered 2021-10-03: 1 mg via INTRAVENOUS
  Filled 2021-10-03: qty 1

## 2021-10-03 MED ORDER — PROMETHAZINE HCL 25 MG RE SUPP
25.0000 mg | Freq: Four times a day (QID) | RECTAL | Status: DC | PRN
Start: 2021-10-03 — End: 2021-10-06
  Filled 2021-10-03: qty 1

## 2021-10-03 MED ORDER — FLUOXETINE HCL 20 MG PO CAPS
40.0000 mg | ORAL_CAPSULE | Freq: Every day | ORAL | Status: DC
  Administered 2021-10-04 – 2021-10-06 (×3): 40 mg via ORAL
  Filled 2021-10-03 (×3): qty 2

## 2021-10-03 MED ORDER — POLYETHYLENE GLYCOL 3350 17 G PO PACK: 17.0000 g | PACK | Freq: Every day | ORAL | Status: DC | PRN

## 2021-10-03 MED ORDER — LEVOTHYROXINE SODIUM 50 MCG PO TABS
50.0000 ug | ORAL_TABLET | Freq: Every day | ORAL | Status: DC
  Administered 2021-10-04 – 2021-10-06 (×3): 50 ug via ORAL
  Filled 2021-10-03 (×3): qty 1

## 2021-10-03 MED ORDER — DICYCLOMINE HCL 10 MG/ML IM SOLN
20.0000 mg | Freq: Once | INTRAMUSCULAR | Status: AC
  Administered 2021-10-03: 20 mg via INTRAMUSCULAR
  Filled 2021-10-03: qty 2

## 2021-10-03 MED ORDER — HYDROMORPHONE HCL 1 MG/ML IJ SOLN
1.0000 mg | Freq: Once | INTRAMUSCULAR | Status: AC
  Administered 2021-10-03: 1 mg via INTRAVENOUS
  Filled 2021-10-03: qty 1

## 2021-10-03 MED ORDER — TRAZODONE HCL 50 MG PO TABS
150.0000 mg | ORAL_TABLET | Freq: Every day | ORAL | Status: DC
  Administered 2021-10-04 – 2021-10-05 (×2): 150 mg via ORAL
  Filled 2021-10-03 (×3): qty 3

## 2021-10-03 MED ORDER — ONDANSETRON HCL 4 MG/2ML IJ SOLN
4.0000 mg | Freq: Four times a day (QID) | INTRAMUSCULAR | Status: DC | PRN
  Administered 2021-10-03 – 2021-10-04 (×5): 4 mg via INTRAVENOUS
  Filled 2021-10-03 (×5): qty 2

## 2021-10-03 NOTE — Significant Event (Addendum)
Rapid Response Event Note   Reason for Call :  Tachycardia, HR 130-140bpm  Initial Focused Assessment:  Pt lying in bed, appears uncomfortable. Endorses abdominal pain and nausea. Pain located in the right lower abdomen. Pain increases with palpation. Positive bowel sounds. Skin is warm, dry, pink, non-tenting. Mucous membranes are moist, pink. Heart rate is regular, peripheral pulses 2+.   RN just administered Protonix, Zofran and Reglan.  Maintenance IVF started  VS: T 97.51F, BP 145/79, HR 140, RR 20, SpO2 95% on room air  Interventions:  NO intervention from RR RN  Plan of Care:  -Reevaluate VS 1 hour following PRN medication administration. Likely tachycardia is in response to nausea and abdominal discomfort.  -Oral care per protocol  Call rapid response for additional needs  Event Summary:  MD Notified: per RN Call Time: [place] Time: 0932 End Time: [place], RN

## 2021-10-03 NOTE — H&P (Signed)
History and Physical    Patient: [name] OIZ:[id number] DOB: [birth year] DOA: 10/03/2021 DOS: the patient was seen and examined on 10/03/2021 PCP: Ferd Hibbs, NP  Patient coming from: Home - lives with husband and 2 kids; NOK: Joletta, [place], [phone]    Chief Complaint: N/V  HPI: [name] is a 51 y.o. female with medical history significant of chronic pain with drug-seeking behavior; Barrett's esophagus; HTN; HLD; and PTSD/depression/anxiety presenting with n/v.  This is her 4th recent hospitalization for the same.   She was last admitted from 5/29-6/1 with intractable nausea.  GI consulted and plans for eventual gastric emptying study and EGD.  She improved with Reglan during last hospitalization.  She is currently somewhat hystrionic.  She was not actively vomiting at the time of my evaluation but had an emesis bag with maybe 100 cc yellow liquid.  She reported severe abdominal pain, requesting pain medications.    ER Course:  Recurrent admissions for n/v.  Better last time with Reglan/Zofran.     Review of Systems: As mentioned in the history of present illness. All other systems reviewed and are negative. Past Medical History:  Diagnosis Date   Anxiety    Arthritis    "joints ache all over" (10/15/2014)   Barrett's esophagus    Bulging lumbar disc    Chronic lower back pain    DDD (degenerative disc disease), cervical    Depression    Drug-seeking behavior    Headache    "weekly" (10/15/2014)   Hyperlipemia    Hypertension    PTSD (post-traumatic stress disorder)    Skin cancer    "had them cut off my arms; don't know what kind"   Past Surgical History:  Procedure Laterality Date   ABLATION ON ENDOMETRIOSIS  2008   BIOPSY  12/27/2018   Procedure: BIOPSY;  Surgeon: Thornton Park, MD;  Location: [facility];  Service: Gastroenterology;;   ESOPHAGOGASTRODUODENOSCOPY (EGD) WITH PROPOFOL N/A 12/27/2018   Procedure: ESOPHAGOGASTRODUODENOSCOPY  (EGD) WITH PROPOFOL;  Surgeon: Thornton Park, MD;  Location: [facility];  Service: Gastroenterology;  Laterality: N/A;   HEMORRHOID SURGERY  ~ 2002   IR FLUORO GUIDE CV LINE RIGHT  06/14/2020   IR REMOVAL TUN CV CATH W/O FL  06/23/2020   IR US GUIDE VASC ACCESS RIGHT  06/14/2020   ORIF ANKLE FRACTURE Right 03/28/2020   Procedure: OPEN REDUCTION INTERNAL FIXATION (ORIF) RIGHT BIMALLEOLAR ANKLE FRACTURE;  Surgeon: Marchia Bond, MD;  Location: [place];  Service: Orthopedics;  Laterality: Right;   Social History:  reports that she has never smoked. She has never used smokeless tobacco. She reports that she does not currently use alcohol. She reports that she does not use drugs.  No Known Allergies  Family History  Problem Relation Age of Onset   Breast cancer Mother    Diabetes Mother    Breast cancer Maternal Grandmother    Breast cancer Paternal Grandmother    Colon polyps Paternal Grandmother    Colon cancer Neg Hx    Esophageal cancer Neg Hx    Rectal cancer Neg Hx    Stomach cancer Neg Hx     Prior to Admission medications   Medication Sig Start Date End Date Taking? Authorizing Provider  acetaminophen (TYLENOL) 325 MG tablet Take 2 tablets (650 mg total) by mouth every 6 (six) hours as needed for mild pain, fever or headache. Patient taking differently: Take 650 mg by mouth as needed for mild pain, fever  or headache. 07/01/20  Yes Angiulli, Lavon Paganini, PA-C  ARIPiprazole (ABILIFY) 5 MG tablet Take 1 tablet (5 mg total) by mouth daily. 08/01/20  Yes Jamse Arn, MD  atorvastatin (LIPITOR) 10 MG tablet Take 10 mg by mouth daily. 08/26/20  Yes [provider]  Bismuth Subsalicylate (DIARRHEA PO) Take 2-3 tablets by mouth See admin instructions. Take 2-3 tablets by mouth as needed for diarrhea. Unknown frequency   Yes [provider]  blood glucose meter kit and supplies KIT Dispense based on patient and insurance preference. Use up to four times  daily as directed. 08/03/21  Yes Antonieta Pert, MD  carvedilol (COREG) 6.25 MG tablet Take 1 tablet (6.25 mg total) by mouth 2 (two) times daily with a meal. 08/01/20  Yes Patel, Domenick Bookbinder, MD  diphenhydrAMINE HCl (BENADRYL PO) Take 75 mg by mouth at bedtime.   Yes [provider]  FLUoxetine (PROZAC) 40 MG capsule Take 1 capsule (40 mg total) by mouth daily. 07/01/20  Yes Angiulli, Lavon Paganini, PA-C  levothyroxine (SYNTHROID) 50 MCG tablet Take 50 mcg by mouth daily.   Yes [provider]  metFORMIN (GLUCOPHAGE) 500 MG tablet Take 500 mg by mouth 2 (two) times daily with a meal.   Yes [provider]  multivitamin (RENA-VIT) TABS tablet Take 1 tablet by mouth at bedtime. 08/01/20  Yes Jamse Arn, MD  ondansetron (ZOFRAN-ODT) 4 MG disintegrating tablet Take 1 tablet (4 mg total) by mouth every 8 (eight) hours as needed for nausea or vomiting. Patient taking differently: Take 4 mg by mouth 2 (two) times daily as needed for nausea or vomiting. 08/29/21  Yes Pahwani, Einar Grad, MD  oxyCODONE-acetaminophen (PERCOCET) 7.5-325 MG tablet Take 1 tablet by mouth See admin instructions. Take 7.5 - 325 mg (1-2 tab) 5- times daily   Yes [provider]  pantoprazole (PROTONIX) 40 MG tablet Take 1 tablet (40 mg total) by mouth daily. 09/21/21 09/21/22 Yes Elodia Florence., MD  prazosin (MINIPRESS) 2 MG capsule Take 1 capsule (2 mg total) by mouth at bedtime. 08/01/20  Yes Jamse Arn, MD  promethazine (PHENERGAN) 25 MG tablet Take 25 mg by mouth 2 (two) times daily as needed for nausea or vomiting.   Yes [provider]  tiZANidine (ZANAFLEX) 4 MG tablet Take 12 mg by mouth 3 (three) times daily. 09/19/21  Yes [provider]  traZODone (DESYREL) 50 MG tablet Take 3 tablets (150 mg total) by mouth at bedtime. 09/21/21  Yes Elodia Florence., MD  metoCLOPramide (REGLAN) 5 MG tablet Take 1 tablet (5 mg total) by mouth 3 (three) times daily before meals for 5  days. Patient not taking: Reported on 10/03/2021 09/21/21 09/26/21  Elodia Florence., MD    Physical Exam: Vitals:   10/03/21 1517 10/03/21 0622 10/03/21 0630 10/03/21 0800  BP: (!) 177/95   (!) 180/110  Pulse: (!) 129 (!) 117 79 (!) 112  Resp: (!) _0 (!) 22  Temp: 98.9 F (37.2 C)     TempSrc: Oral     SpO2: 99% 100% 100% 98%   General:  Appears anxious and disheveled, asking for pain medication Eyes:  EOMI, normal lids, iris ENT:  grossly normal hearing, lips & tongue, mildly dry mm; edentulous Neck:  no LAD, masses or thyromegaly Cardiovascular:  RRR, no m/r/g. No LE edema.  Respiratory:   CTA bilaterally with no wheezes/rales/rhonchi.  Normal respiratory effort. Abdomen:  soft, mildly diffusely TTP without  peritoneal signs, ND Skin:  no rash or induration seen on limited exam Musculoskeletal:  grossly normal tone BUE/BLE, good ROM, no bony abnormality Psychiatric:  anxious/agitated mood and affect, speech fluent and appropriate, AOx3 Neurologic:  CN 2-12 grossly intact, moves all extremities in coordinated fashion   Radiological Exams on Admission: Independently reviewed - see discussion in A/P where applicable  CT ABDOMEN PELVIS W CONTRAST  Result Date: 10/03/2021 CLINICAL DATA:  51 year old female with abdominal pain, nausea vomiting. EXAM: CT ABDOMEN AND PELVIS WITH CONTRAST TECHNIQUE: Multidetector CT imaging of the abdomen and pelvis was performed using the standard protocol following bolus administration of intravenous contrast. RADIATION DOSE REDUCTION: This exam was performed according to the departmental dose-optimization program which includes automated exposure control, adjustment of the mA and/or kV according to patient size and/or use of iterative reconstruction technique. CONTRAST:  185m OMNIPAQUE IOHEXOL 300 MG/ML  SOLN COMPARISON:  CT Abdomen and Pelvis 09/18/2021 and earlier. FINDINGS: Lower chest: Stable elevation of the right hemidiaphragm, otherwise  negative. Hepatobiliary: Hepatic steatosis redemonstrated. Otherwise negative liver and gallbladder. Pancreas: Stable partial atrophy. Spleen: Negative. Adrenals/Urinary Tract: Stable, negative. Stomach/Bowel: Most of the large bowel is decompressed. Normal retrocecal appendix on series 3, image 61. No convincing large bowel inflammation. Decompressed terminal ileum and no dilated small bowel. Negative stomach. Decompressed duodenum. No free air, free fluid, or mesenteric inflammation identified. Vascular/Lymphatic: Minor aortic atherosclerosis. Major arterial structures in the abdomen and pelvis are patent. Portal venous system appears patent. No lymphadenopathy. Reproductive: Negative. Other: No pelvic free fluid. Musculoskeletal: Chronic lower thoracic and mid lumbar endplate Schmorl's nodes. Chronic bilateral pubic rami fractures. No acute osseous abnormality identified. IMPRESSION: 1. Stable CT abdomen and pelvis from last month. No acute or inflammatory process identified. Normal appendix. 2. Hepatic steatosis. Electronically Signed   By: HGenevie AnnM.D.   On: 10/03/2021 06:51    EKG: Independently reviewed.  Sinus tachycardia with rate 135; nonspecific ST changes with no evidence of acute ischemia   Labs on Admission: I have personally reviewed the available labs and imaging studies at the time of the admission.  Pertinent labs:    VBG: 7.353/32.9/18.3 CO2 16 Glucose 184 BUN 10/Creatinine 1.21/GFR 55; normal on 6/1 AST 57/ALT 35/Bili 1.3 WBC 18.2 Platelets 536 HCG <5   Assessment and Plan: Principal Problem:   Intractable nausea and vomiting Active Problems:   T2DM (type 2 diabetes mellitus) ([facility]   Essential hypertension   AKI (acute kidney injury) ([facility]   Hyperlipidemia   Chronic pain disorder   Hypothyroidism   Class 2 obesity due to excess calories with body mass index (BMI) of 37.0 to 306.2in adult    Cyclical vomiting -Patient with multiple recent admissions for abdominal  pain with n/v -With negative evaluation including CT, cyclical vomiting appears likely +/- gastroparesis -She has been suggested for outpatient gastric emptying scan, may need while hospitalized -She does acknowledge a significant amount of life stressors which may also be contributing -Overall, her labs and imaging appear benign at this time other than mild AKI, metabolic acidosis -She received a multitude of treatments in the ER including dicyclomine, Haldol, Dilaudid, metoclopramide, IVF, and Zofran -Will observe overnight -Will consult GI for evaluation -Encourage good bowel regimen as well as behavioral health support  -Will restart IV Reglan and also give daily IV Protonix for now -Will add Zostrix cream   AKI -Mild AKI, likely associated with vomiting and poor PO intake -Will gently rehydrate -Avoid nephrotoxic agents -Repeat BMP in AM  DM -Recent A1c was 7.2, reasonable control -hold Glucophage -Cover with moderate-scale SSI    HTN -Continue carvedilol, prazosin   Chronic pain syndrome -I have reviewed this patient in the [place] Controlled Substances Reporting System.  She is receiving medications from only one provider and appears to be taking them as prescribed. -She is at high risk of opioid misuse, diversion, or overdose.  -Will continue Percocet without parenteral opioids, as these are not generally recommended with cyclical vomiting. -?whether opiate withdrawal could be contributing to symptoms -Hold tizanidine for now   Mood d/o -Continue home meds - Abilify, fluoxetine, prazosin, trazodone   HLD -Continue Lipitor   Hypothyroidism -Continue Synthroid   Class 2 obesity -Prior Body mass index was 37.16 kg/m; will request current weight -Weight loss should be encouraged -Outpatient PCP/bariatric medicine/bariatric surgery f/u encouraged       Advance Care Planning:   Code Status: Full Code   Consults: GI  DVT Prophylaxis: Lovenox  Family  Communication: None present; she is capable of communicating with family at this time  Severity of Illness: The appropriate patient status for this patient is OBSERVATION. Observation status is judged to be reasonable and necessary in order to provide the required intensity of service to ensure the patient's safety. The patient's presenting symptoms, physical exam findings, and initial radiographic and laboratory data in the context of their medical condition is felt to place them at decreased risk for further clinical deterioration. Furthermore, it is anticipated that the patient will be medically stable for discharge from the hospital within 2 midnights of admission.   Author: Karmen Bongo, MD 10/03/2021 11:47 AM  For on call review [email or web address].

## 2021-10-03 NOTE — ED Provider Notes (Signed)
51 yo female with nausea and vomiting, recurrent problem. Seen by GI, sched for follow up next week, recommended endoscopy. Given fluids and zofran. WBC elevated at 18k Physical Exam  BP (!) 177/95   Pulse 79   Temp 98.9 F (37.2 C) (Oral)   Resp 17   LMP 06/03/2018 (Approximate) Comment: neg hcg 05/10/20  SpO2 100%   Physical Exam  Procedures  Procedures  ED Course / MDM    Medical Decision Making Amount and/or Complexity of Data Reviewed Labs: ordered. Radiology: ordered.  Risk Prescription drug management. Decision regarding hospitalization.   CT without significant/acute findings. Patient continues to vomit. Will order haldol and call hospitalist for AKI, dehydration.    Discussed with Dr. [name] with [facility] who will admit, requests GI consult as well. Message sent to [facility], Azucena Freed, PA-C.    Tacy Learn, PA-C 10/03/21 0929    Fatima Blank, MD 10/04/21 [phone]

## 2021-10-03 NOTE — ED Triage Notes (Signed)
Pt reports she is here today due to abd pain, N&V.Pt is tachy in triage.

## 2021-10-03 NOTE — ED Provider Triage Note (Signed)
Emergency Medicine Provider Triage Evaluation Note  [name] , a 51 y.o. female  was evaluated in triage.  Pt complains of N/V that began last night. 13-14 episodes of emesis, 1 episode of diarrhea. Associated upper abdominal pain. Tried zofran @ home without relief. Several hospital admissions for same, had been doing well since her last discharge until last night. .  Review of Systems  Positive: N/V/D, abdominal pain Negative: Fever, hematemesis, melena.   Physical Exam  BP (!) 161/122 (BP Location: Right Arm)   Pulse (!) 160   Temp 98.7 F (37.1 C) (Oral)   Resp 15   LMP 06/03/2018 (Approximate) Comment: neg hcg 05/10/20  SpO2 97%  Gen:   Awake,  Resp:  Normal effort  MSK:   Moves extremities without difficulty  Other:  Tachycardia. Upper abdominal TTP.   Medical Decision Making  Medically screening exam initiated at 2:30 AM.  Appropriate orders placed.  MAANYA HIPPERT was informed that the remainder of the evaluation will be completed by another provider, this initial triage assessment does not replace that evaluation, and the importance of remaining in the ED until their evaluation is complete.  HR 140s-160s, will prioritize rooming.    Amaryllis Dyke, [place] 10/03/21 [phone]

## 2021-10-03 NOTE — ED Notes (Signed)
Pt taken to CT.

## 2021-10-03 NOTE — Consult Note (Signed)
[facility] Consult: 1:11 PM 10/03/2021  LOS: 0 days    Referring Provider: Dr [name].    Primary Care Physician:  Ferd Hibbs, NP Primary Gastroenterologist:  Dr. Tarri Glenn   Reason for Consultation: Intractable nausea and vomiting.   HPI: [name] is a 51 y.o. female.  Past history NIDDM.  Chronic pain, drug-seeking behavior.  PTSD/depression/anxiety.  Hypertension.  Hyperlipidemia.  Obesity.  05/2020 Cardiac arrest, asystole a/w aspiration pneumonia, rhabdomyolysis, AKI requiring CRRT.  Chronic bilateral pubic rami fractures, chronic lower thoracic and mid lumbar disc protrusions.  GI work-ups for cyclic vomiting and for IDA in 2020. 12/2018 EGD.  Normal esophagus, biopsied.  Small HH, biopsied, normal duodenum biopsied. Pathology showed mild reactive gastropathy, mild chronic gastritis, no H. pylori.  Duodenal biopsies benign.  Esophageal biopsies showed Barrett's esophagus without dysplasia. 02/2019 colonoscopy: 2 small sessile polyps removed from the IC valve and ascending colon.  Rectal inflammation with mild erythema and granularity, biopsied. Path of polyps: tubular adenomas without HGD.  Random colon and rectal biopsies were benign without evidence for inflammation or microscopic colitis. 02/2019 VCE: Tiny, nonbleeding AVM at 6 hours, 36 minutes.  Adequate prep, complete study. Amitriptyline added for cyclic vomiting.  Admission late 5/29-09/21/21.  Dr Benson Norway and Pyrtle followed for N/V, abdominal pain.  1 of several admits for same over the past years.  09/18/2021 CTAP with contrast without any acute abdominal, pelvic findings.  Hepatic steatosis.  Minor aortic atherosclerosis.  Dr Hilarie Fredrickson suggested obtaining gastric emptying study as outpatient since inpt anti-emetics and narcotics would adversely effect  the study.  A1c elevated at 7.2 and better glycemic control recommended.  Symptoms eventually subsided with IV Zofran, IV metoclopramide.  Discharged with diagnosis of cyclic vomiting syndrome.  PPI switched from omeprazole to pantoprazole 40 mg daily, Zofran and phenergan prn, Percocet 7.5/325 1 to 2 tablets 5 times daily Rxd at discharge. Takes this chronically as rxd by pain mgt specialist for back pain. Has appt w GI NP on 6/19.    This is now her fourth hospitalization with nonbloody nausea and vomiting, central abdominal pain.  No diarrhea or constipation.  Vomiting her meds.  ~ 20 # wt loss over 6 months.   Hypertensive, systolics to the 355D and diastolic as high as 322.  (vomiting up htn meds). Heart rate anywhere from low 100s to 140s.  Excellent room air saturations.  No fever.  CTAP unchanged from weeks ago. (This was her 3rd CTAP this year).   Sodium 134.  BUN 10/creatinine 1.2.  GFR 55 T. bili 1.3.  Alk phos 92.  AST/ALT 57/35.  Total CK 117, normal.  INR 1.3.   WBCs 18.2 (13.4 on 09/20/21).  Hb 17.7.  MCV 91.  Platelets 536.   No ETOH, no illicit drugs. Lives w husband.      Past Medical History:  Diagnosis Date   Anxiety    Arthritis    "joints ache all over" (10/15/2014)   Barrett's esophagus    Bulging lumbar disc    Chronic lower back pain  DDD (degenerative disc disease), cervical    Depression    Drug-seeking behavior    Headache    "weekly" (10/15/2014)   Hyperlipemia    Hypertension    PTSD (post-traumatic stress disorder)    Skin cancer    "had them cut off my arms; don't know what kind"    Past Surgical History:  Procedure Laterality Date   ABLATION ON ENDOMETRIOSIS  2008   BIOPSY  12/27/2018   Procedure: BIOPSY;  Surgeon: Thornton Park, MD;  Location: [facility];  Service: Gastroenterology;;   ESOPHAGOGASTRODUODENOSCOPY (EGD) WITH PROPOFOL N/A 12/27/2018   Procedure: ESOPHAGOGASTRODUODENOSCOPY (EGD) WITH PROPOFOL;  Surgeon: Thornton Park, MD;   Location: [facility];  Service: Gastroenterology;  Laterality: N/A;   HEMORRHOID SURGERY  ~ 2002   IR FLUORO GUIDE CV LINE RIGHT  06/14/2020   IR REMOVAL TUN CV CATH W/O FL  06/23/2020   IR US GUIDE VASC ACCESS RIGHT  06/14/2020   ORIF ANKLE FRACTURE Right 03/28/2020   Procedure: OPEN REDUCTION INTERNAL FIXATION (ORIF) RIGHT BIMALLEOLAR ANKLE FRACTURE;  Surgeon: Marchia Bond, MD;  Location: [facility];  Service: Orthopedics;  Laterality: Right;    Prior to Admission medications   Medication Sig Start Date End Date Taking? Authorizing Provider  acetaminophen (TYLENOL) 325 MG tablet Take 2 tablets (650 mg total) by mouth every 6 (six) hours as needed for mild pain, fever or headache. Patient taking differently: Take 650 mg by mouth as needed for mild pain, fever or headache. 07/01/20  Yes Angiulli, Lavon Paganini, PA-C  ARIPiprazole (ABILIFY) 5 MG tablet Take 1 tablet (5 mg total) by mouth daily. 08/01/20  Yes Jamse Arn, MD  atorvastatin (LIPITOR) 10 MG tablet Take 10 mg by mouth daily. 08/26/20  Yes [provider]  Bismuth Subsalicylate (DIARRHEA PO) Take 2-3 tablets by mouth See admin instructions. Take 2-3 tablets by mouth as needed for diarrhea. Unknown frequency   Yes [provider]  blood glucose meter kit and supplies KIT Dispense based on patient and insurance preference. Use up to four times daily as directed. 08/03/21  Yes Antonieta Pert, MD  carvedilol (COREG) 6.25 MG tablet Take 1 tablet (6.25 mg total) by mouth 2 (two) times daily with a meal. 08/01/20  Yes Patel, Domenick Bookbinder, MD  diphenhydrAMINE HCl (BENADRYL PO) Take 75 mg by mouth at bedtime.   Yes [provider]  FLUoxetine (PROZAC) 40 MG capsule Take 1 capsule (40 mg total) by mouth daily. 07/01/20  Yes Angiulli, Lavon Paganini, PA-C  levothyroxine (SYNTHROID) 50 MCG tablet Take 50 mcg by mouth daily.   Yes [provider]  metFORMIN (GLUCOPHAGE) 500 MG tablet Take 500 mg by mouth 2 (two)  times daily with a meal.   Yes [provider]  multivitamin (RENA-VIT) TABS tablet Take 1 tablet by mouth at bedtime. 08/01/20  Yes Jamse Arn, MD  ondansetron (ZOFRAN-ODT) 4 MG disintegrating tablet Take 1 tablet (4 mg total) by mouth every 8 (eight) hours as needed for nausea or vomiting. Patient taking differently: Take 4 mg by mouth 2 (two) times daily as needed for nausea or vomiting. 08/29/21  Yes Pahwani, Einar Grad, MD  oxyCODONE-acetaminophen (PERCOCET) 7.5-325 MG tablet Take 1 tablet by mouth See admin instructions. Take 7.5 - 325 mg (1-2 tab) 5- times daily   Yes [provider]  pantoprazole (PROTONIX) 40 MG tablet Take 1 tablet (40 mg total) by mouth daily. 09/21/21 09/21/22 Yes Elodia Florence., MD  prazosin (MINIPRESS) 2  MG capsule Take 1 capsule (2 mg total) by mouth at bedtime. 08/01/20  Yes Jamse Arn, MD  promethazine (PHENERGAN) 25 MG tablet Take 25 mg by mouth 2 (two) times daily as needed for nausea or vomiting.   Yes [provider]  tiZANidine (ZANAFLEX) 4 MG tablet Take 12 mg by mouth 3 (three) times daily. 09/19/21  Yes [provider]  traZODone (DESYREL) 50 MG tablet Take 3 tablets (150 mg total) by mouth at bedtime. 09/21/21  Yes Elodia Florence., MD  metoCLOPramide (REGLAN) 5 MG tablet Take 1 tablet (5 mg total) by mouth 3 (three) times daily before meals for 5 days. Patient not taking: Reported on 10/03/2021 09/21/21 09/26/21  Elodia Florence., MD    Scheduled Meds:  ARIPiprazole  5 mg Oral Daily   atorvastatin  10 mg Oral Daily   capsicum   Topical BID   carvedilol  6.25 mg Oral BID WC   docusate sodium  100 mg Oral BID   enoxaparin (LOVENOX) injection  40 mg Subcutaneous Daily   FLUoxetine  40 mg Oral Daily   insulin aspart  0-15 Units Subcutaneous TID WC   insulin aspart  0-5 Units Subcutaneous QHS   levothyroxine  50 mcg Oral Daily   metoCLOPramide (REGLAN) injection  10 mg Intravenous Q6H    oxyCODONE-acetaminophen  1 tablet Oral See admin instructions   pantoprazole (PROTONIX) IV  40 mg Intravenous Daily   prazosin  2 mg Oral QHS   sodium chloride flush  3 mL Intravenous Q12H   traZODone  150 mg Oral QHS   Infusions:  lactated ringers     PRN Meds: acetaminophen **OR** acetaminophen, bisacodyl, hydrALAZINE, ondansetron **OR** ondansetron (ZOFRAN) IV, polyethylene glycol, promethazine   Allergies as of 10/03/2021   (No Known Allergies)    Family History  Problem Relation Age of Onset   Breast cancer Mother    Diabetes Mother    Breast cancer Maternal Grandmother    Breast cancer Paternal Grandmother    Colon polyps Paternal Grandmother    Colon cancer Neg Hx    Esophageal cancer Neg Hx    Rectal cancer Neg Hx    Stomach cancer Neg Hx     Social History   Socioeconomic History   Marital status: Married    Spouse name: Not on file   Number of children: Not on file   Years of education: Not on file   Highest education level: Not on file  Occupational History   Occupation: unemployed  Tobacco Use   Smoking status: Never   Smokeless tobacco: Never  Vaping Use   Vaping Use: Never used  Substance and Sexual Activity   Alcohol use: Not Currently    Comment: 10/15/2014 "I've drank before; nothing regular; don't drink now cause of RX I'm on"   Drug use: No   Sexual activity: Not Currently  Other Topics Concern   Not on file  Social History Narrative   [place]. Never smoked; quit alcohol 10 years; used to work for [employer]. NO IV drug abuse.    Social Determinants of Health   Financial Resource Strain: Not on file  Food Insecurity: Not on file  Transportation Needs: Not on file  Physical Activity: Not on file  Stress: Not on file  Social Connections: Not on file  Intimate Partner Violence: Not on file    REVIEW OF SYSTEMS: Constitutional:  weakness ENT:  No nose bleeds  Pulm:  non-productive  cough.  CV:  No palpitations, no LE edema.  No angina. GU:  No hematuria, no frequency GI: See HPI. Heme: Denies unusual or excessive bleeding or bruising Transfusions: Yes as recently as 05/2020. Neuro:  No headaches, no peripheral tingling or numbness Derm:  No itching, no rash or sores.  Endocrine:  No sweats or chills.  No polyuria or dysuria Immunization: Reviewed. Travel:  None beyond local counties in last few months.    PHYSICAL EXAM: Vital signs in last 24 hours: Vitals:   10/03/21 0800 10/03/21 1228  BP: (!) 180/110 (!) 160/85  Pulse: (!) 112 (!) 130  Resp: (!) 22 20  Temp:  98.7 F (37.1 C)  SpO2: 98% 100%   Wt Readings from Last 3 Encounters:  09/18/21 98.2 kg  08/28/21 97.7 kg  08/09/21 107.5 kg   General: Obese, cushingoid looking.  Looks chronically ill.  Somewhat uncomfortable resting on the bed.  Empty emesis bag in her hand. Head: No facial asymmetry or swelling.  Cushingoid facies. Eyes: No conjunctival pallor or scleral icterus Ears: No hearing deficit Nose: No congestion or discharge.  Mouth: Clear, moist oral mucosa.  Tongue midline. Neck: No JVD, no masses, no thyromegaly. Lungs: Poor inspiratory effort but clear.  No labored breathing.  Dry cough at times. Heart: RR, tachycardic in the 140s.  No MRG.  S1, S2 present Abdomen: Soft.  With distraction there is no tenderness, however without distraction she complains of pain but there is no guarding or rebound and exam is essentially unremarkable.  Obese.  Active bowel sounds.   Rectal: Deferred Musc/Skeltl: No joint redness, swelling or gross deformities. Extremities: No CCE. Neurologic: Oriented x3.  Fully alert.  No tremors, moves all 4 but strength not tested. Skin: No rash, no sores, no telangiectasia. Nodes: No cervical adenopathy Psych: Cooperative, calm.  Flat affect.  Intake/Output from previous day: No intake/output data recorded. Intake/Output this shift: No intake/output data  recorded.  LAB RESULTS: Recent Labs    10/03/21 0329 10/03/21 0720  WBC 18.2*  --   HGB 16.8* 17.7*  HCT 50.6* 52.0*  PLT 536*  --    BMET Lab Results  Component Value Date   NA 134 (L) 10/03/2021   NA 136 10/03/2021   NA 141 09/21/2021   K 4.8 10/03/2021   K 4.6 10/03/2021   K 3.2 (L) 09/21/2021   CL 100 10/03/2021   CL 109 09/21/2021   CL 110 09/20/2021   CO2 16 (L) 10/03/2021   CO2 26 09/21/2021   CO2 25 09/20/2021   GLUCOSE 184 (H) 10/03/2021   GLUCOSE 103 (H) 09/21/2021   GLUCOSE 92 09/20/2021   BUN 10 10/03/2021   BUN 5 (L) 09/21/2021   BUN <5 (L) 09/20/2021   CREATININE 1.21 (H) 10/03/2021   CREATININE 0.70 09/21/2021   CREATININE 0.82 09/20/2021   CALCIUM 9.8 10/03/2021   CALCIUM 7.7 (L) 09/21/2021   CALCIUM 7.8 (L) 09/20/2021   LFT Recent Labs    10/03/21 0329  PROT 8.3*  ALBUMIN 3.9  AST 57*  ALT 35  ALKPHOS 92  BILITOT 1.3*   PT/INR Lab Results  Component Value Date   INR 1.3 (H) 08/01/2021   INR 1.8 (H) 06/08/2020   INR 1.30 10/08/2015   Hepatitis Panel No results for input(s): "HEPBSAG", "HCVAB", "HEPAIGM", "HEPBIGM" in the last 72 hours. C-Diff No components found for: "CDIFF" Lipase     Component Value Date/Time   LIPASE 18 10/03/2021 0329  Drugs of Abuse     Component Value Date/Time   LABOPIA POSITIVE (A) 08/01/2021 1659   COCAINSCRNUR NONE DETECTED 08/01/2021 1659   COCAINSCRNUR Negative 06/08/2020 1944   COCAINSCRNUR NONE DETECTED 11/28/2018 0325   LABBENZ NONE DETECTED 08/01/2021 1659   AMPHETMU POSITIVE (A) 08/01/2021 1659   THCU NONE DETECTED 08/01/2021 1659   LABBARB NONE DETECTED 08/01/2021 1659     RADIOLOGY STUDIES: CT ABDOMEN PELVIS W CONTRAST  Result Date: 10/03/2021 CLINICAL DATA:  51 year old female with abdominal pain, nausea vomiting. EXAM: CT ABDOMEN AND PELVIS WITH CONTRAST TECHNIQUE: Multidetector CT imaging of the abdomen and pelvis was performed using the standard protocol following bolus  administration of intravenous contrast. RADIATION DOSE REDUCTION: This exam was performed according to the departmental dose-optimization program which includes automated exposure control, adjustment of the mA and/or kV according to patient size and/or use of iterative reconstruction technique. CONTRAST:  119m OMNIPAQUE IOHEXOL 300 MG/ML  SOLN COMPARISON:  CT Abdomen and Pelvis 09/18/2021 and earlier. FINDINGS: Lower chest: Stable elevation of the right hemidiaphragm, otherwise negative. Hepatobiliary: Hepatic steatosis redemonstrated. Otherwise negative liver and gallbladder. Pancreas: Stable partial atrophy. Spleen: Negative. Adrenals/Urinary Tract: Stable, negative. Stomach/Bowel: Most of the large bowel is decompressed. Normal retrocecal appendix on series 3, image 61. No convincing large bowel inflammation. Decompressed terminal ileum and no dilated small bowel. Negative stomach. Decompressed duodenum. No free air, free fluid, or mesenteric inflammation identified. Vascular/Lymphatic: Minor aortic atherosclerosis. Major arterial structures in the abdomen and pelvis are patent. Portal venous system appears patent. No lymphadenopathy. Reproductive: Negative. Other: No pelvic free fluid. Musculoskeletal: Chronic lower thoracic and mid lumbar endplate Schmorl's nodes. Chronic bilateral pubic rami fractures. No acute osseous abnormality identified. IMPRESSION: 1. Stable CT abdomen and pelvis from last month. No acute or inflammatory process identified. Normal appendix. 2. Hepatic steatosis. Electronically Signed   By: HGenevie AnnM.D.   On: 10/03/2021 06:51      IMPRESSION:   Recurrent cyclic vomiting syndrome with abdominal pain.  Possible role of diabetic gastroparesis though studies not yet performed.  Long-term scheduled narcotics amounting to 30 mg of Oxycodone daily are complicating the issue.  Mild elevation of t bili, AST in pt w hepatic steatosis.       Leukocytosis.  No fever    Polycythemia in pt w  volume contraction    Hypertension and tachycardia in pt who has missed doses of carvedilol and prazosin due to N/V.      Narcotics dependent back pain.      Lifetime high radiation exposure from innumerable CTs, xrays.    PLAN:      Supportive care w IVF, anti nauseals.  Try to limit pain meds.     In the long-term, should try to wean pt off narcotics.      SAzucena Freed 10/03/2021, 1:11 PM Phone [phone]

## 2021-10-03 NOTE — ED Provider Notes (Signed)
[name]   CSN: [id number] Arrival date & time: 10/03/21  0225     History  Chief Complaint  Patient presents with   Abdominal Pain    [name] is a 51 y.o. female.  HPI  Patient with medical history including hypertension, depression, anxiety, PTSD chronic pain, diabetes presents to the emerged part with complaints of abdominal pain nausea and vomiting.  Patient is stomach pain started yesterday, states pain is in the middle of her abdomen, does not radiate, states pain is intermittent, states after she had pain she started have nausea and vomiting, states she has vomited approxifourteen times, she denies hematemesis or coffee-ground emesis, states she has also had an episode of diarrhea this is only 1 time, denies any melena or hematochezia, states that she has not had a bowel movement since yesterday states she is not passing flatus.  Patient has had no abdominal surgeries, no history of bowel obstructions, denies alcohol use or NSAID use denies illicit drug use, please see that she frequently gets nausea and vomiting and states this feels last time she was admitted.  She has no other complaints.  I have reviewed patient's chart she has been admitted 3 times for similar presentations, she had CT abdomen pelvis which all which were unremarkable, symptoms resolved with supportive management, she was evaluated by GI upon her last visit and recommend that she gets outpatient endoscopy for further evaluation.  Patient's had an endoscopy as well as a colonoscopy 2 years ago which were both unremarkable.  Home Medications Prior to Admission medications   Medication Sig Start Date End Date Taking? Authorizing Provider  acetaminophen (TYLENOL) 325 MG tablet Take 2 tablets (650 mg total) by mouth every 6 (six) hours as needed for mild pain, fever or headache. Patient taking differently: Take 650 mg by mouth as needed for mild pain,  fever or headache. 07/01/20  Yes Angiulli, Lavon Paganini, PA-C  ARIPiprazole (ABILIFY) 5 MG tablet Take 1 tablet (5 mg total) by mouth daily. 08/01/20  Yes Jamse Arn, MD  atorvastatin (LIPITOR) 10 MG tablet Take 10 mg by mouth daily. 08/26/20  Yes [provider]  Bismuth Subsalicylate (DIARRHEA PO) Take 2-3 tablets by mouth See admin instructions. Take 2-3 tablets by mouth as needed for diarrhea. Unknown frequency   Yes [provider]  blood glucose meter kit and supplies KIT Dispense based on patient and insurance preference. Use up to four times daily as directed. 08/03/21  Yes Antonieta Pert, MD  carvedilol (COREG) 6.25 MG tablet Take 1 tablet (6.25 mg total) by mouth 2 (two) times daily with a meal. 08/01/20  Yes Patel, Domenick Bookbinder, MD  diphenhydrAMINE HCl (BENADRYL PO) Take 75 mg by mouth at bedtime.   Yes [provider]  FLUoxetine (PROZAC) 40 MG capsule Take 1 capsule (40 mg total) by mouth daily. 07/01/20  Yes Angiulli, Lavon Paganini, PA-C  levothyroxine (SYNTHROID) 50 MCG tablet Take 50 mcg by mouth daily.   Yes [provider]  metFORMIN (GLUCOPHAGE) 500 MG tablet Take 500 mg by mouth 2 (two) times daily with a meal.   Yes [provider]  multivitamin (RENA-VIT) TABS tablet Take 1 tablet by mouth at bedtime. 08/01/20  Yes Jamse Arn, MD  ondansetron (ZOFRAN-ODT) 4 MG disintegrating tablet Take 1 tablet (4 mg total) by mouth every 8 (eight) hours as needed for nausea or vomiting. Patient taking differently: Take 4 mg by mouth 2 (two) times  daily as needed for nausea or vomiting. 08/29/21  Yes Pahwani, Einar Grad, MD  oxyCODONE-acetaminophen (PERCOCET) 7.5-325 MG tablet Take 1 tablet by mouth See admin instructions. Take 7.5 - 325 mg (1-2 tab) 5- times daily   Yes [provider]  pantoprazole (PROTONIX) 40 MG tablet Take 1 tablet (40 mg total) by mouth daily. 09/21/21 09/21/22 Yes Elodia Florence., MD  prazosin (MINIPRESS) 2 MG capsule Take 1  capsule (2 mg total) by mouth at bedtime. 08/01/20  Yes Jamse Arn, MD  promethazine (PHENERGAN) 25 MG tablet Take 25 mg by mouth 2 (two) times daily as needed for nausea or vomiting.   Yes [provider]  tiZANidine (ZANAFLEX) 4 MG tablet Take 12 mg by mouth 3 (three) times daily. 09/19/21  Yes [provider]  traZODone (DESYREL) 50 MG tablet Take 3 tablets (150 mg total) by mouth at bedtime. 09/21/21  Yes Elodia Florence., MD  metoCLOPramide (REGLAN) 5 MG tablet Take 1 tablet (5 mg total) by mouth 3 (three) times daily before meals for 5 days. Patient not taking: Reported on 10/03/2021 09/21/21 09/26/21  Elodia Florence., MD      Allergies    Patient has no known allergies.    Review of Systems   Review of Systems  Constitutional:  Negative for chills and fever.  Respiratory:  Negative for shortness of breath.   Cardiovascular:  Negative for chest pain.  Gastrointestinal:  Positive for abdominal pain, diarrhea, nausea and vomiting.  Neurological:  Negative for headaches.    Physical Exam Updated Vital Signs BP (!) 177/95   Pulse 79   Temp 98.9 F (37.2 C) (Oral)   Resp 17   LMP 06/03/2018 (Approximate) Comment: neg hcg 05/10/20  SpO2 100%  Physical Exam Vitals and nursing [name] reviewed.  Constitutional:      General: She is not in acute distress.    Appearance: She is not ill-appearing.  HENT:     Head: Normocephalic and atraumatic.     Nose: No congestion.  Eyes:     Conjunctiva/sclera: Conjunctivae normal.  Cardiovascular:     Rate and Rhythm: Regular rhythm. Tachycardia present.     Pulses: Normal pulses.     Heart sounds: No murmur heard.    No friction rub. No gallop.  Pulmonary:     Effort: No respiratory distress.     Breath sounds: No wheezing, rhonchi or rales.  Abdominal:     Palpations: Abdomen is soft.     Tenderness: There is abdominal tenderness. There is no right CVA tenderness or left CVA tenderness.     Comments:  Abdomen nondistended, dull to percussion, had noted epigastric tenderness without guarding rebound as or peritoneal sign.  Skin:    General: Skin is warm and dry.  Neurological:     Mental Status: She is alert.  Psychiatric:        Mood and Affect: Mood normal.     ED Results / Procedures / Treatments   Labs (all labs ordered are listed, but only abnormal results are displayed) Labs Reviewed  COMPREHENSIVE METABOLIC PANEL - Abnormal; Notable for the following components:      Result Value   CO2 16 (*)    Glucose, Bld 184 (*)    Creatinine, Ser 1.21 (*)    Total Protein 8.3 (*)    AST 57 (*)    Total Bilirubin 1.3 (*)    GFR, Estimated 55 (*)    Anion gap  20 (*)    All other components within normal limits  CBC WITH DIFFERENTIAL/PLATELET - Abnormal; Notable for the following components:   WBC 18.2 (*)    RBC 5.52 (*)    Hemoglobin 16.8 (*)    HCT 50.6 (*)    Platelets 536 (*)    Neutro Abs 15.0 (*)    Abs Immature Granulocytes 0.10 (*)    All other components within normal limits  CBG MONITORING, ED - Abnormal; Notable for the following components:   Glucose-Capillary 192 (*)    All other components within normal limits  LIPASE, BLOOD  URINALYSIS, ROUTINE W REFLEX MICROSCOPIC  RAPID URINE DRUG SCREEN, HOSP PERFORMED  BLOOD GAS, VENOUS  I-STAT BETA HCG BLOOD, ED ([facility], [facility], [facility] ONLY)    EKG EKG Interpretation  Date/Time:  Tuesday October 03 2021 02:36:47 EDT Ventricular Rate:  135 PR Interval:  128 QRS Duration: 68 QT Interval:  292 QTC Calculation: 438 R Axis:   18 Text Interpretation: Sinus tachycardia Nonspecific ST abnormality Abnormal ECG When compared with ECG of 21-Sep-2021 10:04, PREVIOUS ECG IS PRESENT Otherwise no significant change Confirmed by Addison Lank [phone]) on 10/03/2021 3:01:41 AM  Radiology CT ABDOMEN PELVIS W CONTRAST  Result Date: 10/03/2021 CLINICAL DATA:  51 year old female with abdominal pain, nausea vomiting. EXAM: CT ABDOMEN AND PELVIS  WITH CONTRAST TECHNIQUE: Multidetector CT imaging of the abdomen and pelvis was performed using the standard protocol following bolus administration of intravenous contrast. RADIATION DOSE REDUCTION: This exam was performed according to the departmental dose-optimization program which includes automated exposure control, adjustment of the mA and/or kV according to patient size and/or use of iterative reconstruction technique. CONTRAST:  177m OMNIPAQUE IOHEXOL 300 MG/ML  SOLN COMPARISON:  CT Abdomen and Pelvis 09/18/2021 and earlier. FINDINGS: Lower chest: Stable elevation of the right hemidiaphragm, otherwise negative. Hepatobiliary: Hepatic steatosis redemonstrated. Otherwise negative liver and gallbladder. Pancreas: Stable partial atrophy. Spleen: Negative. Adrenals/Urinary Tract: Stable, negative. Stomach/Bowel: Most of the large bowel is decompressed. Normal retrocecal appendix on series 3, image 61. No convincing large bowel inflammation. Decompressed terminal ileum and no dilated small bowel. Negative stomach. Decompressed duodenum. No free air, free fluid, or mesenteric inflammation identified. Vascular/Lymphatic: Minor aortic atherosclerosis. Major arterial structures in the abdomen and pelvis are patent. Portal venous system appears patent. No lymphadenopathy. Reproductive: Negative. Other: No pelvic free fluid. Musculoskeletal: Chronic lower thoracic and mid lumbar endplate Schmorl's nodes. Chronic bilateral pubic rami fractures. No acute osseous abnormality identified. IMPRESSION: 1. Stable CT abdomen and pelvis from last month. No acute or inflammatory process identified. Normal appendix. 2. Hepatic steatosis. Electronically Signed   By: HGenevie AnnM.D.   On: 10/03/2021 06:51    Procedures Procedures    Medications Ordered in ED Medications  sodium chloride 0.9 % bolus 1,000 mL (1,000 mLs Intravenous New Bag/Given 10/03/21 0331)  ondansetron (ZOFRAN) injection 4 mg (4 mg Intravenous Given 10/03/21  0326)  dicyclomine (BENTYL) injection 20 mg (20 mg Intramuscular Given 10/03/21 0327)  HYDROmorphone (DILAUDID) injection 1 mg (1 mg Intravenous Given 10/03/21 0424)  metoCLOPramide (REGLAN) injection 10 mg (10 mg Intravenous Given 10/03/21 0620)  sodium chloride 0.9 % bolus 1,000 mL (1,000 mLs Intravenous New Bag/Given 10/03/21 0651)  iohexol (OMNIPAQUE) 300 MG/ML solution 100 mL (100 mLs Intravenous Contrast Given 10/03/21 0636)    ED Course/ Medical Decision Making/ A&P  Medical Decision Making Amount and/or Complexity of Data Reviewed Labs: ordered. Radiology: ordered.  Risk Prescription drug management.   This patient presents to the ED for concern of nausea vomiting, this involves an extensive number of treatment options, and is a complaint that carries with it a high risk of complications and morbidity.  The differential diagnosis includes pancreatitis, bowel obstruction, diverticulitis    Additional history obtained:  Additional history obtained from N/A External records from outside source obtained and reviewed including previous endoscopy notes, GI notes   Co morbidities that complicate the patient evaluation  Chronic nausea and vomiting  Social Determinants of Health:  N/A    Lab Tests:  I Ordered, and personally interpreted labs.  The pertinent results include: CBC shows leukocytosis of 18.2, CMP shows CO2 of 16 glucose 180, T. bili 1.3 anion gap 28 lipase is 18   Imaging Studies ordered:  I ordered imaging studies including CT abdomen pelvis I independently visualized and interpreted imaging which showed negative acute findings I agree with the radiologist interpretation   Cardiac Monitoring:  The patient was maintained on a cardiac monitor.  I personally viewed and interpreted the cardiac monitored which showed an underlying rhythm of: Without signs of ischemia   Medicines ordered and prescription drug management:  I ordered  medication including fluids, antiemetics, pain medication I have reviewed the patients home medicines and have made adjustments as needed  Critical Interventions:  N/A   Reevaluation:  Presents with nausea and vomiting, she was tachycardic on my exam, will provide with fluids, antiemetics, pain medication and reassess  Noted that patient has increased white count as well as abdominal pain, I am concerned for possible intra-abdominal infection at this time will obtain CT imaging for further evaluation.  Patient was reassessed, continues to have nausea and vomiting, will provide her with additional dose of antiemetics and fluids.  Consultations Obtained:  N/A   Test Considered:  N/A    Rule out low suspicion for lower lobe pneumonia as lung sounds are clear bilaterally, will defer imaging at this time.  I have low suspicion for liver or gallbladder abnormality as she has no right upper quadrant tenderness, liver enzymes, alk phos, T bili all within normal limits.  Low suspicion for pancreatitis as lipase is within normal limits.  Low suspicion for ruptured stomach ulcer as she has no peritoneal sign present on exam.  Low suspicion for bowel obstruction as abdomen is nondistended normal bowel sounds, so passing gas and having normal bowel movements.  Low suspicion for complicated diverticulitis as she is nontoxic-appearing, vital signs reassuring no leukocytosis present.  Low suspicion for appendicitis as she has no right lower quadrant tenderness, vital signs reassuring.  It is noted that patient has decreased bicarb as well as  increased anion gap, unclear if this is secondary due to significant dehydration versus DKA, will add on VBG will need further evaluation.  I have low suspicion for mesenteric ischemia presentation is atypical, patient has a benign abdominal exam.     Dispostion and problem list  Due to shift change patient handoff to Suella Broad, PA-C  Recommend follow-up  on CT imaging, if unremarkable attempt p.o. challenge, patient is unable to tolerate p.o., would recommend admission for continued nausea and vomiting.            Final Clinical Impression(s) / ED Diagnoses Final diagnoses:  Nausea vomiting and diarrhea    Rx / DC Orders ED Discharge Orders     None  Marcello Fennel, PA-C 10/03/21 0703    Fatima Blank, MD 10/03/21 [phone]

## 2021-10-03 NOTE — Progress Notes (Signed)
Patient's HR sustaining in the 120's-130's, sinus tachycardia on telemetry. Gardner,MD notified via secure chat. New orders received.Will continue to monitor.

## 2021-10-03 NOTE — Plan of Care (Signed)
  Problem: Education: Goal: Knowledge of General Education information will improve Description: Including pain rating scale, medication(s)/side effects and non-pharmacologic comfort measures Outcome: Progressing   Problem: Clinical Measurements: Goal: Ability to maintain clinical measurements within normal limits will improve Outcome: Progressing   

## 2021-10-03 NOTE — Progress Notes (Signed)
   10/03/21 1330  Assess: MEWS Score  Temp 97.9 F (36.6 C)  BP (!) 145/79  MAP (mmHg) 92  Pulse Rate (!) 140  Resp 20  Level of Consciousness Alert  SpO2 95 %  O2 Device Room Air  Assess: MEWS Score  MEWS Temp 0  MEWS Systolic 0  MEWS Pulse 3  MEWS RR 0  MEWS LOC 0  MEWS Score 3  MEWS Score Color Yellow  Assess: if the MEWS score is Yellow or Red  Were vital signs taken at a resting state? Yes  Focused Assessment No change from prior assessment  MEWS guidelines implemented *See Row Information* Yes  Treat  MEWS Interventions Escalated (See documentation below) (called rapid)  Take Vital Signs  Increase Vital Sign Frequency  Yellow: Q 2hr X 2 then Q 4hr X 2, if remains yellow, continue Q 4hrs  Escalate  MEWS: Escalate Yellow: discuss with charge nurse/RN and consider discussing with provider and RRT  Notify: Charge Nurse/RN  Name of Charge Nurse/RN Notified Harley Hallmark, RN  Date Charge Nurse/RN Notified 10/03/21  Time Charge Nurse/RN Notified 1345  Notify: Provider  Provider Name/Title Karmen Bongo, MD  Date Provider Notified 10/03/21  Time Provider Notified 1330  Method of Notification Page  Notification Reason Other (Comment) (Yellow MEWS, HR 140s)  Provider response No new orders  Date of Provider Response 10/03/21  Time of Provider Response 1331  Notify: Rapid Response  Name of Rapid Response RN Notified Wilburn Cornelia, RN  Date Rapid Response Notified 10/03/21  Time Rapid Response Notified 1350  Document  Patient Outcome Other (Comment) (Will continue to monitor)  Progress note created (see row info) Yes  Assess: SIRS CRITERIA  SIRS Temperature  0  SIRS Pulse 1  SIRS Respirations  0  SIRS WBC 1  SIRS Score Sum  2

## 2021-10-03 NOTE — ED Notes (Signed)
Pt states that she cannot urinate right now. Pt gown is covered in vomit but she does not wish to change or reposition in bed at this time

## 2021-10-04 DIAGNOSIS — M5030 Other cervical disc degeneration, unspecified cervical region: Secondary | ICD-10-CM | POA: Diagnosis present

## 2021-10-04 DIAGNOSIS — E872 Acidosis, unspecified: Secondary | ICD-10-CM | POA: Diagnosis present

## 2021-10-04 DIAGNOSIS — E6609 Other obesity due to excess calories: Secondary | ICD-10-CM | POA: Diagnosis present

## 2021-10-04 DIAGNOSIS — F4312 Post-traumatic stress disorder, chronic: Secondary | ICD-10-CM | POA: Diagnosis present

## 2021-10-04 DIAGNOSIS — E039 Hypothyroidism, unspecified: Secondary | ICD-10-CM | POA: Diagnosis present

## 2021-10-04 DIAGNOSIS — Z7984 Long term (current) use of oral hypoglycemic drugs: Secondary | ICD-10-CM | POA: Diagnosis not present

## 2021-10-04 DIAGNOSIS — G894 Chronic pain syndrome: Secondary | ICD-10-CM | POA: Diagnosis present

## 2021-10-04 DIAGNOSIS — K760 Fatty (change of) liver, not elsewhere classified: Secondary | ICD-10-CM | POA: Diagnosis present

## 2021-10-04 DIAGNOSIS — E876 Hypokalemia: Secondary | ICD-10-CM | POA: Diagnosis present

## 2021-10-04 DIAGNOSIS — F1120 Opioid dependence, uncomplicated: Secondary | ICD-10-CM | POA: Diagnosis present

## 2021-10-04 DIAGNOSIS — E1143 Type 2 diabetes mellitus with diabetic autonomic (poly)neuropathy: Secondary | ICD-10-CM | POA: Diagnosis present

## 2021-10-04 DIAGNOSIS — Z79890 Hormone replacement therapy (postmenopausal): Secondary | ICD-10-CM | POA: Diagnosis not present

## 2021-10-04 DIAGNOSIS — Z79899 Other long term (current) drug therapy: Secondary | ICD-10-CM | POA: Diagnosis not present

## 2021-10-04 DIAGNOSIS — K3184 Gastroparesis: Secondary | ICD-10-CM | POA: Diagnosis present

## 2021-10-04 DIAGNOSIS — E785 Hyperlipidemia, unspecified: Secondary | ICD-10-CM | POA: Diagnosis present

## 2021-10-04 DIAGNOSIS — K209 Esophagitis, unspecified without bleeding: Secondary | ICD-10-CM | POA: Diagnosis not present

## 2021-10-04 DIAGNOSIS — M159 Polyosteoarthritis, unspecified: Secondary | ICD-10-CM | POA: Diagnosis present

## 2021-10-04 DIAGNOSIS — Z833 Family history of diabetes mellitus: Secondary | ICD-10-CM | POA: Diagnosis not present

## 2021-10-04 DIAGNOSIS — Z8371 Family history of colonic polyps: Secondary | ICD-10-CM | POA: Diagnosis not present

## 2021-10-04 DIAGNOSIS — R112 Nausea with vomiting, unspecified: Secondary | ICD-10-CM | POA: Diagnosis present

## 2021-10-04 DIAGNOSIS — Z765 Malingerer [conscious simulation]: Secondary | ICD-10-CM | POA: Diagnosis not present

## 2021-10-04 DIAGNOSIS — E119 Type 2 diabetes mellitus without complications: Secondary | ICD-10-CM | POA: Diagnosis not present

## 2021-10-04 DIAGNOSIS — N179 Acute kidney failure, unspecified: Secondary | ICD-10-CM | POA: Diagnosis present

## 2021-10-04 DIAGNOSIS — I10 Essential (primary) hypertension: Secondary | ICD-10-CM | POA: Diagnosis present

## 2021-10-04 DIAGNOSIS — Z803 Family history of malignant neoplasm of breast: Secondary | ICD-10-CM | POA: Diagnosis not present

## 2021-10-04 DIAGNOSIS — E860 Dehydration: Secondary | ICD-10-CM | POA: Diagnosis present

## 2021-10-04 DIAGNOSIS — Z6837 Body mass index (BMI) 37.0-37.9, adult: Secondary | ICD-10-CM | POA: Diagnosis not present

## 2021-10-04 DIAGNOSIS — K2970 Gastritis, unspecified, without bleeding: Secondary | ICD-10-CM | POA: Diagnosis not present

## 2021-10-04 LAB — CBC
HCT: 43.5 % (ref 36.0–46.0)
Hemoglobin: 14.6 g/dL (ref 12.0–15.0)
MCH: 29.9 pg (ref 26.0–34.0)
MCHC: 33.6 g/dL (ref 30.0–36.0)
MCV: 89.1 fL (ref 80.0–100.0)
Platelets: 426 10*3/uL — ABNORMAL HIGH (ref 150–400)
RBC: 4.88 MIL/uL (ref 3.87–5.11)
RDW: 14 % (ref 11.5–15.5)
WBC: 19.1 10*3/uL — ABNORMAL HIGH (ref 4.0–10.5)
nRBC: 0 % (ref 0.0–0.2)

## 2021-10-04 LAB — BASIC METABOLIC PANEL
Anion gap: 16 — ABNORMAL HIGH (ref 5–15)
BUN: <5 mg/dL — ABNORMAL LOW (ref 6–20)
CO2: 18 mmol/L — ABNORMAL LOW (ref 22–32)
Calcium: 9.4 mg/dL (ref 8.9–10.3)
Chloride: 108 mmol/L (ref 98–111)
Creatinine, Ser: 0.82 mg/dL (ref 0.44–1.00)
GFR, Estimated: >60 mL/min (ref >60)
Glucose, Bld: 127 mg/dL — ABNORMAL HIGH (ref 70–99)
Potassium: 3.3 mmol/L — ABNORMAL LOW (ref 3.5–5.1)
Sodium: 142 mmol/L (ref 135–145)

## 2021-10-04 LAB — GLUCOSE, CAPILLARY
Glucose-Capillary: 129 mg/dL — ABNORMAL HIGH (ref 70–99)
Glucose-Capillary: 113 mg/dL — ABNORMAL HIGH (ref 70–99)
Glucose-Capillary: 106 mg/dL — ABNORMAL HIGH (ref 70–99)
Glucose-Capillary: 123 mg/dL — ABNORMAL HIGH (ref 70–99)

## 2021-10-04 MED ORDER — AMLODIPINE BESYLATE 10 MG PO TABS
10.0000 mg | ORAL_TABLET | Freq: Every day | ORAL | Status: DC
  Administered 2021-10-04 – 2021-10-06 (×3): 10 mg via ORAL
  Filled 2021-10-04 (×3): qty 1

## 2021-10-04 MED ORDER — CARVEDILOL 12.5 MG PO TABS
12.5000 mg | ORAL_TABLET | Freq: Two times a day (BID) | ORAL | Status: DC
  Administered 2021-10-04 – 2021-10-06 (×4): 12.5 mg via ORAL
  Filled 2021-10-04 (×4): qty 1

## 2021-10-04 MED ORDER — LABETALOL HCL 5 MG/ML IV SOLN
10.0000 mg | Freq: Once | INTRAVENOUS | Status: AC
  Administered 2021-10-04: 10 mg via INTRAVENOUS
  Filled 2021-10-04: qty 4

## 2021-10-04 MED ORDER — KETOROLAC TROMETHAMINE 30 MG/ML IJ SOLN
30.0000 mg | Freq: Four times a day (QID) | INTRAMUSCULAR | Status: DC
  Administered 2021-10-04 – 2021-10-06 (×8): 30 mg via INTRAVENOUS
  Filled 2021-10-04 (×8): qty 1

## 2021-10-04 MED ORDER — POTASSIUM CHLORIDE 10 MEQ/100ML IV SOLN
10.0000 meq | INTRAVENOUS | Status: AC
  Administered 2021-10-04 (×4): 10 meq via INTRAVENOUS
  Filled 2021-10-04 (×4): qty 100

## 2021-10-04 MED ORDER — HYDROMORPHONE HCL 1 MG/ML IJ SOLN: 0.5000 mg | INTRAMUSCULAR | Status: DC | PRN

## 2021-10-04 NOTE — Progress Notes (Signed)
Patient had been calling for pain medicine and nausea medicine constantly even after med had been given five minutes prior. Patient also refused her night p.o meds as she said she can't take it because she's nauseous. Patient able to tolerate p.o pain med well.No vomiting noted through out the shift.

## 2021-10-04 NOTE — Progress Notes (Signed)
  Transition of Care [facility]) Screening Note   Patient Details  Name: [name] Date of Birth: October 07, 1970   [facility]) CM/SW Contact:    Tom-Johnson, Renea Ee, RN Phone Number: 10/04/2021, 2:48 PM  Patient is admitted for N/V/abd pains. Recent admits for same symptoms.  From home with husband and two children. States she is unemployed and on disability. Husband drives to and from appointments. Has a cane, walker, wheelchair and shower seat at home.  PCP is Ferd Hibbs, NP and uses [facility] in [place].   Transition of Care Department [facility]) has reviewed patient and no TOC needs or recommendations have been identified at this time. TOC will continue to monitor patient advancement through interdisciplinary progression rounds. If new patient transition needs arise, please place a TOC consult.

## 2021-10-04 NOTE — Progress Notes (Signed)
Initial Nutrition Assessment  DOCUMENTATION CODES:   Obesity unspecified  INTERVENTION:  - Monitor for diet advancement, will add nutrition supplements as appropriate  NUTRITION DIAGNOSIS:   Inadequate oral intake related to nausea, vomiting as evidenced by per patient/family report, NPO status.  GOAL:   Patient will meet greater than or equal to 90% of their needs  MONITOR:   Diet advancement, Labs, Weight trends  REASON FOR ASSESSMENT:   Malnutrition Screening Tool    ASSESSMENT:   Pt admitted from home with intractable n/v. Noted to have multiple prior admissions for similar symptoms.  PMH significant for chronic pain with drug seeking behavior, Barrett's esophagus, T2DM, HTN, HLD and PTSD/depression/anxiety.    Per review of chart, question whether cyclical vomiting is attributed to gastroparesis. Pt was planned for outpatient gastric emptying scan. During last admission, pt noted to have symptom improvement with reglan, plans to administer during admission. GI planning for EGD tomorrow.   Spoke with pt at bedside, appears to be uncomfortable as she remained restless during assessment. She states that she is in pain and and feeling nauseous. She states that she has not eaten since Sunday. Her appetite and intake comes and goes d/t cyclical n/v. When she does eats, she states that she eats small meals 3 times daily however did not provide details d/t nausea. Spoke with RN who states this is ongoing and has been provided medications to help with this and has not witnessed episodes of emesis today.   MD offered laxative for constipation as she reports her last BM was ~4 days ago which is common for her. Pt declines at this time.   Pt states that she has had a 20 lb wt loss within the last 6 months which she attributes to recurrent nausea and vomiting and inability to tolerate nutrition intake. Reviewed wt hx. It appears pt has had wt gain since admission 1 year ago. There is  limited documentation within the last 6 months, however it does appear pt has had significant wt loss within the last 2 months through recurrent admissions. Noted a 16.8% wt loss within the last 2 months which is clinically significant for time frame.   Pt is at risk for malnutrition given cyclical n/v and significant wt loss. Will continue to monitor and reassess throughout admission.   Medications: colace, SSI 0-15 units TID, SSI 0-5 units QHS, synthroid, reglan, protonix IV drips: LR @ 140m/hr  Labs: potassium 3.3 (replacing), BUN <5, anion gap 16, ionized Ca 0.93, AST 57, HgbA1c 7.2%, CBG's 113-192 x24 hours  NUTRITION - FOCUSED PHYSICAL EXAM:  Flowsheet Row Most Recent Value  Orbital Region No depletion  Upper Arm Region No depletion  Thoracic and Lumbar Region No depletion  Buccal Region No depletion  Temple Region No depletion  Clavicle Bone Region No depletion  Clavicle and Acromion Bone Region No depletion  Scapular Bone Region No depletion  Dorsal Hand No depletion  Patellar Region No depletion  Anterior Thigh Region No depletion  Posterior Calf Region No depletion  Edema (RD Assessment) None  Hair Reviewed  Eyes Reviewed  Mouth Reviewed  Skin Reviewed  Nails Reviewed       Diet Order:   Diet Order             Diet NPO time specified Except for: Ice Chips, Sips with Meds  Diet effective now                   EDUCATION NEEDS:  No education needs have been identified at this time  Skin:  Skin Assessment: Reviewed RN Assessment  Last BM:  unknown  Height:   Ht Readings from Last 1 Encounters:  10/03/21 '5\' 4"'$  (1.626 m)    Weight:   Wt Readings from Last 1 Encounters:  10/03/21 95.4 kg    Ideal Body Weight:  54.5 kg  BMI:  Body mass index is 36.1 kg/m.  Estimated Nutritional Needs:   Kcal:  1500-1700  Protein:  75-90g  Fluid:  >/=1.5L  Clayborne Dana, RDN, LDN Clinical Nutrition

## 2021-10-04 NOTE — Progress Notes (Signed)
PROGRESS NOTE  [name]  LTJ:[id number] DOB: January 24, 1971 DOA: 10/03/2021 PCP: Ferd Hibbs, NP   Brief Narrative:  Patient is a 51 year old female with history of chronic pain syndrome, drug-seeking behavior, Barrett's esophagus, hypertension, hyperlipidemia, PTSD/anxiety/depression who presented with intractable nausea, vomiting.  Fourth hospitalization for the same.  She was last admitted on 5/29 and was discharged on 6/1.  At that time GI was consulted and there was plan for gastric emptying, EGD.  She improved with Reglan and was discharged.  On presentation lab work showed AKI with creatinine of 1.2, leukocytosis.  GI consulted with plan for possible EGD.  Assessment & Plan:  Principal Problem:   Intractable nausea and vomiting Active Problems:   T2DM (type 2 diabetes mellitus) (HCC)   Essential hypertension   AKI (acute kidney injury) ([place])   Hyperlipidemia   Chronic pain disorder   Hypothyroidism   Class 2 obesity due to excess calories with body mass index (BMI) of 37.0 to 07.6 in adult   Cyclic vomiting syndrome: Multiple admissions for the same.  Recent admission in May.  Presented with intractable nausea, vomiting.  Unclear etiology but gastroparesis suspected.  On her last admission, she was referred for outpatient gastric emptying scan.  GI already following here.  Continue IV fluids, symptom management with antiemetics.  GI planning for EGD at some point.  Also started on IV Reglan and IV Protonix  AKI: Resolved with IV fluids  Diabetes type 2: Recent hemoglobin A1c of 7.2.  On metformin at home.  Continue sliding scale insulin  Hypertension: Severely hypertensive.  Continue carvedilol, prazosin.  Continue as needed medications for severe hypertension  Severe abdomen pain/chronic pain syndrome: High risk for opioid abuse, overdose.  Takes oxycodone at home for back pain.  Complains of severe abdominal pain, in visible distress.  Started on low-dose Dilaudid.   Continue supportive care for discharge  Mood disorder: On Abilify, fluoxetine, prazosin, trazodone at home  Hyperlipidemia: Lipitor  Hypothyroidism: On Synthyroid  Hypokalemia: Supplemented and will be monitored  Leukocytosis: Unclear etiology.  Most likely reactive.  Continue to monitor.  No acute findings in the CT abdomen/pelvis  Obesity:  BMI of 32.1            DVT prophylaxis:enoxaparin (LOVENOX) injection 40 mg Start: 10/03/21 1200     Code Status: Full Code  Family Communication: None at bedside  Patient status:Obs  Patient is from :Home  Anticipated discharge AU:QJFH  Estimated DC date:1-2 days, after GI work-up   Consultants: GI  Procedures:None  Antimicrobials:  Anti-infectives (From admission, onward)    None       Subjective:  Patient seen and examined at the bedside this afternoon.  She was hypertensive, lying in bed, in severe abdominal pain, looks visibly distressed.  Having severe nausea. Objective: Vitals:   10/03/21 2052 10/04/21 0002 10/04/21 0653 10/04/21 0957  BP: (!) 157/103 (!) 143/95 140/83 (!) 194/99  Pulse: (!) 117 (!) 116 (!) 113 (!) 109  Resp: '18 19 19 19  '$ Temp: 98.7 F (37.1 C) 98.5 F (36.9 C) 99 F (37.2 C) 98.4 F (36.9 C)  TempSrc: Oral Oral Oral   SpO2: 97% 97% 97% 96%  Weight:      Height:        Intake/Output Summary (Last 24 hours) at 10/04/2021 1335 Last data filed at 10/04/2021 0800 Gross per 24 hour  Intake 1910.3 ml  Output 100 ml  Net 1810.3 ml   Filed Weights   10/03/21 1323  Weight: 95.4 kg    Examination:  General exam: In moderate to severe distress due to abdominal discomfort, obese HEENT: PERRL Respiratory system:  no wheezes or crackles  Cardiovascular system: S1 & S2 heard, RRR.  Gastrointestinal system: Abdomen is nondistended, soft and has mild to moderate generalized tenderness.  Bowel sounds present Central nervous system: Alert and oriented Extremities: No edema, no clubbing  ,no cyanosis Skin: No rashes, no ulcers,no icterus     Data Reviewed: I have personally reviewed following labs and imaging studies  CBC: Recent Labs  Lab 10/03/21 0329 10/03/21 0720 10/04/21 0234  WBC 18.2*  --  19.1*  NEUTROABS 15.0*  --   --   HGB 16.8* 17.7* 14.6  HCT 50.6* 52.0* 43.5  MCV 91.7  --  89.1  PLT 536*  --  101*   Basic Metabolic Panel: Recent Labs  Lab 10/03/21 0329 10/03/21 0720 10/04/21 0234  NA 136 134* 142  K 4.6 4.8 3.3*  CL 100  --  108  CO2 16*  --  18*  GLUCOSE 184*  --  127*  BUN 10  --  <5*  CREATININE 1.21*  --  0.82  CALCIUM 9.8  --  9.4     No results found for this or any previous visit (from the past 240 hour(s)).   Radiology Studies: CT ABDOMEN PELVIS W CONTRAST  Result Date: 10/03/2021 CLINICAL DATA:  51 year old female with abdominal pain, nausea vomiting. EXAM: CT ABDOMEN AND PELVIS WITH CONTRAST TECHNIQUE: Multidetector CT imaging of the abdomen and pelvis was performed using the standard protocol following bolus administration of intravenous contrast. RADIATION DOSE REDUCTION: This exam was performed according to the departmental dose-optimization program which includes automated exposure control, adjustment of the mA and/or kV according to patient size and/or use of iterative reconstruction technique. CONTRAST:  133m OMNIPAQUE IOHEXOL 300 MG/ML  SOLN COMPARISON:  CT Abdomen and Pelvis 09/18/2021 and earlier. FINDINGS: Lower chest: Stable elevation of the right hemidiaphragm, otherwise negative. Hepatobiliary: Hepatic steatosis redemonstrated. Otherwise negative liver and gallbladder. Pancreas: Stable partial atrophy. Spleen: Negative. Adrenals/Urinary Tract: Stable, negative. Stomach/Bowel: Most of the large bowel is decompressed. Normal retrocecal appendix on series 3, image 61. No convincing large bowel inflammation. Decompressed terminal ileum and no dilated small bowel. Negative stomach. Decompressed duodenum. No free air, free  fluid, or mesenteric inflammation identified. Vascular/Lymphatic: Minor aortic atherosclerosis. Major arterial structures in the abdomen and pelvis are patent. Portal venous system appears patent. No lymphadenopathy. Reproductive: Negative. Other: No pelvic free fluid. Musculoskeletal: Chronic lower thoracic and mid lumbar endplate Schmorl's nodes. Chronic bilateral pubic rami fractures. No acute osseous abnormality identified. IMPRESSION: 1. Stable CT abdomen and pelvis from last month. No acute or inflammatory process identified. Normal appendix. 2. Hepatic steatosis. Electronically Signed   By: HGenevie AnnM.D.   On: 10/03/2021 06:51    Scheduled Meds:  ARIPiprazole  5 mg Oral Daily   atorvastatin  10 mg Oral Daily   capsicum   Topical BID   carvedilol  6.25 mg Oral BID WC   docusate sodium  100 mg Oral BID   enoxaparin (LOVENOX) injection  40 mg Subcutaneous Daily   FLUoxetine  40 mg Oral Daily   HYDROcodone-acetaminophen  1 tablet Oral 5 X Daily   insulin aspart  0-15 Units Subcutaneous TID WC   insulin aspart  0-5 Units Subcutaneous QHS   ketorolac  30 mg Intravenous Q6H   levothyroxine  50 mcg Oral Daily   metoCLOPramide (REGLAN) injection  10 mg Intravenous Q6H   pantoprazole (PROTONIX) IV  40 mg Intravenous Daily   prazosin  2 mg Oral QHS   sodium chloride flush  3 mL Intravenous Q12H   traZODone  150 mg Oral QHS   Continuous Infusions:  lactated ringers 125 mL/hr at 10/04/21 1217   potassium chloride 10 mEq (10/04/21 1317)     LOS: 0 days   Shelly Coss, MD [facility] P6/14/2023, 1:35 PM

## 2021-10-04 NOTE — H&P (View-Only) (Signed)
Daily Rounding Note  10/04/2021, 1:30 PM  LOS: 0 days   SUBJECTIVE:   Chief complaint:    Chronic intermittent nausea, vomiting, abdominal pain.  Narcotics dependency.  Continues to request pain meds frequently.  Sometimes 5 minutes after she just got a dose.  1 point she started shaking when she was about to get her pain meds.  Nurse told her he was concerned and that he did not feel comfortable giving her the pain meds when she was shaking.  Almost immediately the shaking stopped. Has not had a bowel movement since Saturday.  Its not unusual for her to go up to 4 days without a bowel movement.  She declined my offer to provide a laxative. RN also says she has been chronically complaining of nausea and holding the emesis bag in her hand but has not produced any emesis or regurgitation.  OBJECTIVE:         Vital signs in last 24 hours:    Temp:  [98.3 F (36.8 C)-99 F (37.2 C)] 98.4 F (36.9 C) (06/14 0957) Pulse Rate:  [109-138] 109 (06/14 0957) Resp:  [17-19] 19 (06/14 0957) BP: (140-202)/(78-103) 194/99 (06/14 0957) SpO2:  [89 %-97 %] 96 % (06/14 0957)   Filed Weights   10/03/21 1323  Weight: 95.4 kg   General: Looks cushingoid, unwell but not acutely ill or toxic.  As I entered the room did not appear uncomfortable. Heart: RRR. Chest: No labored breathing or cough Abdomen:  Soft.  Not tender (examined while I was speaking to her so she was distracted).  Bowel sounds active. Extremities: CCE. Neuro/Psych: Alert.  Oriented x3.  Intake/Output from previous day: 06/13 0701 - 06/14 0700 In: 1910.3 [I.V.:910.3; IV Piggyback:1000] Out: 100 [Urine:100]  Intake/Output this shift: No intake/output data recorded.  Lab Results: Recent Labs    10/03/21 0329 10/03/21 0720 10/04/21 0234  WBC 18.2*  --  19.1*  HGB 16.8* 17.7* 14.6  HCT 50.6* 52.0* 43.5  PLT 536*  --  426*   BMET Recent Labs    10/03/21 0329  10/03/21 0720 10/04/21 0234  NA 136 134* 142  K 4.6 4.8 3.3*  CL 100  --  108  CO2 16*  --  18*  GLUCOSE 184*  --  127*  BUN 10  --  <5*  CREATININE 1.21*  --  0.82  CALCIUM 9.8  --  9.4   LFT Recent Labs    10/03/21 0329  PROT 8.3*  ALBUMIN 3.9  AST 57*  ALT 35  ALKPHOS 92  BILITOT 1.3*   PT/INR No results for input(s): "LABPROT", "INR" in the last 72 hours. Hepatitis Panel No results for input(s): "HEPBSAG", "HCVAB", "HEPAIGM", "HEPBIGM" in the last 72 hours.  Studies/Results: CT ABDOMEN PELVIS W CONTRAST  Result Date: 10/03/2021 CLINICAL DATA:  51 year old female with abdominal pain, nausea vomiting. EXAM: CT ABDOMEN AND PELVIS WITH CONTRAST TECHNIQUE: Multidetector CT imaging of the abdomen and pelvis was performed using the standard protocol following bolus administration of intravenous contrast. RADIATION DOSE REDUCTION: This exam was performed according to the departmental dose-optimization program which includes automated exposure control, adjustment of the mA and/or kV according to patient size and/or use of iterative reconstruction technique. CONTRAST:  166m OMNIPAQUE IOHEXOL 300 MG/ML  SOLN COMPARISON:  CT Abdomen and Pelvis 09/18/2021 and earlier. FINDINGS: Lower chest: Stable elevation of the right hemidiaphragm, otherwise negative. Hepatobiliary: Hepatic steatosis redemonstrated. Otherwise negative liver and gallbladder. Pancreas: Stable  partial atrophy. Spleen: Negative. Adrenals/Urinary Tract: Stable, negative. Stomach/Bowel: Most of the large bowel is decompressed. Normal retrocecal appendix on series 3, image 61. No convincing large bowel inflammation. Decompressed terminal ileum and no dilated small bowel. Negative stomach. Decompressed duodenum. No free air, free fluid, or mesenteric inflammation identified. Vascular/Lymphatic: Minor aortic atherosclerosis. Major arterial structures in the abdomen and pelvis are patent. Portal venous system appears patent. No  lymphadenopathy. Reproductive: Negative. Other: No pelvic free fluid. Musculoskeletal: Chronic lower thoracic and mid lumbar endplate Schmorl's nodes. Chronic bilateral pubic rami fractures. No acute osseous abnormality identified. IMPRESSION: 1. Stable CT abdomen and pelvis from last month. No acute or inflammatory process identified. Normal appendix. 2. Hepatic steatosis. Electronically Signed   By: Genevie Ann M.D.   On: 10/03/2021 06:51    Scheduled Meds:  amLODipine  10 mg Oral Daily   ARIPiprazole  5 mg Oral Daily   atorvastatin  10 mg Oral Daily   capsicum   Topical BID   carvedilol  12.5 mg Oral BID WC   docusate sodium  100 mg Oral BID   enoxaparin (LOVENOX) injection  40 mg Subcutaneous Daily   FLUoxetine  40 mg Oral Daily   HYDROcodone-acetaminophen  1 tablet Oral 5 X Daily   insulin aspart  0-15 Units Subcutaneous TID WC   insulin aspart  0-5 Units Subcutaneous QHS   ketorolac  30 mg Intravenous Q6H   labetalol  10 mg Intravenous Once   levothyroxine  50 mcg Oral Daily   metoCLOPramide (REGLAN) injection  10 mg Intravenous Q6H   pantoprazole (PROTONIX) IV  40 mg Intravenous Daily   prazosin  2 mg Oral QHS   sodium chloride flush  3 mL Intravenous Q12H   traZODone  150 mg Oral QHS   Continuous Infusions:  lactated ringers 125 mL/hr at 10/04/21 1217   potassium chloride 10 mEq (10/04/21 1317)   PRN Meds:.acetaminophen **OR** acetaminophen, bisacodyl, ondansetron **OR** ondansetron (ZOFRAN) IV, polyethylene glycol, promethazine  ASSESMENT:   Acute flare of chronic intermittent intractable nausea, vomiting, abdominal pain.  Continues drug-seeking behavior.  Narcotics dependent back pain, managed by pain clinic.  Hypokalemia.  Mild elevation in LFTs.  Hepatic steatosis per CT.  Platelets above normal  Leukocytosis.  No fever  Hypertension.  Persist despite being restarted on terazosin and carvedilol.Marland Kitchen   PLAN     EGD tmrw.  D/W patient and she is agreeable to proceed.   Orders placed in depot.    Azucena Freed  10/04/2021, 1:30 PM Phone [phone]

## 2021-10-04 NOTE — Progress Notes (Signed)
Daily Rounding Note  10/04/2021, 1:30 PM  LOS: 0 days   SUBJECTIVE:   Chief complaint:    Chronic intermittent nausea, vomiting, abdominal pain.  Narcotics dependency.  Continues to request pain meds frequently.  Sometimes 5 minutes after she just got a dose.  1 point she started shaking when she was about to get her pain meds.  Nurse told her he was concerned and that he did not feel comfortable giving her the pain meds when she was shaking.  Almost immediately the shaking stopped. Has not had a bowel movement since Saturday.  Its not unusual for her to go up to 4 days without a bowel movement.  She declined my offer to provide a laxative. RN also says she has been chronically complaining of nausea and holding the emesis bag in her hand but has not produced any emesis or regurgitation.  OBJECTIVE:         Vital signs in last 24 hours:    Temp:  [98.3 F (36.8 C)-99 F (37.2 C)] 98.4 F (36.9 C) (06/14 0957) Pulse Rate:  [109-138] 109 (06/14 0957) Resp:  [17-19] 19 (06/14 0957) BP: (140-202)/(78-103) 194/99 (06/14 0957) SpO2:  [89 %-97 %] 96 % (06/14 0957)   Filed Weights   10/03/21 1323  Weight: 95.4 kg   General: Looks cushingoid, unwell but not acutely ill or toxic.  As I entered the room did not appear uncomfortable. Heart: RRR. Chest: No labored breathing or cough Abdomen:  Soft.  Not tender (examined while I was speaking to her so she was distracted).  Bowel sounds active. Extremities: CCE. Neuro/Psych: Alert.  Oriented x3.  Intake/Output from previous day: 06/13 0701 - 06/14 0700 In: 1910.3 [I.V.:910.3; IV Piggyback:1000] Out: 100 [Urine:100]  Intake/Output this shift: No intake/output data recorded.  Lab Results: Recent Labs    10/03/21 0329 10/03/21 0720 10/04/21 0234  WBC 18.2*  --  19.1*  HGB 16.8* 17.7* 14.6  HCT 50.6* 52.0* 43.5  PLT 536*  --  426*   BMET Recent Labs    10/03/21 0329  10/03/21 0720 10/04/21 0234  NA 136 134* 142  K 4.6 4.8 3.3*  CL 100  --  108  CO2 16*  --  18*  GLUCOSE 184*  --  127*  BUN 10  --  <5*  CREATININE 1.21*  --  0.82  CALCIUM 9.8  --  9.4   LFT Recent Labs    10/03/21 0329  PROT 8.3*  ALBUMIN 3.9  AST 57*  ALT 35  ALKPHOS 92  BILITOT 1.3*   PT/INR No results for input(s): "LABPROT", "INR" in the last 72 hours. Hepatitis Panel No results for input(s): "HEPBSAG", "HCVAB", "HEPAIGM", "HEPBIGM" in the last 72 hours.  Studies/Results: CT ABDOMEN PELVIS W CONTRAST  Result Date: 10/03/2021 CLINICAL DATA:  51 year old female with abdominal pain, nausea vomiting. EXAM: CT ABDOMEN AND PELVIS WITH CONTRAST TECHNIQUE: Multidetector CT imaging of the abdomen and pelvis was performed using the standard protocol following bolus administration of intravenous contrast. RADIATION DOSE REDUCTION: This exam was performed according to the departmental dose-optimization program which includes automated exposure control, adjustment of the mA and/or kV according to patient size and/or use of iterative reconstruction technique. CONTRAST:  167m OMNIPAQUE IOHEXOL 300 MG/ML  SOLN COMPARISON:  CT Abdomen and Pelvis 09/18/2021 and earlier. FINDINGS: Lower chest: Stable elevation of the right hemidiaphragm, otherwise negative. Hepatobiliary: Hepatic steatosis redemonstrated. Otherwise negative liver and gallbladder. Pancreas: Stable  partial atrophy. Spleen: Negative. Adrenals/Urinary Tract: Stable, negative. Stomach/Bowel: Most of the large bowel is decompressed. Normal retrocecal appendix on series 3, image 61. No convincing large bowel inflammation. Decompressed terminal ileum and no dilated small bowel. Negative stomach. Decompressed duodenum. No free air, free fluid, or mesenteric inflammation identified. Vascular/Lymphatic: Minor aortic atherosclerosis. Major arterial structures in the abdomen and pelvis are patent. Portal venous system appears patent. No  lymphadenopathy. Reproductive: Negative. Other: No pelvic free fluid. Musculoskeletal: Chronic lower thoracic and mid lumbar endplate Schmorl's nodes. Chronic bilateral pubic rami fractures. No acute osseous abnormality identified. IMPRESSION: 1. Stable CT abdomen and pelvis from last month. No acute or inflammatory process identified. Normal appendix. 2. Hepatic steatosis. Electronically Signed   By: Genevie Ann M.D.   On: 10/03/2021 06:51    Scheduled Meds:  amLODipine  10 mg Oral Daily   ARIPiprazole  5 mg Oral Daily   atorvastatin  10 mg Oral Daily   capsicum   Topical BID   carvedilol  12.5 mg Oral BID WC   docusate sodium  100 mg Oral BID   enoxaparin (LOVENOX) injection  40 mg Subcutaneous Daily   FLUoxetine  40 mg Oral Daily   HYDROcodone-acetaminophen  1 tablet Oral 5 X Daily   insulin aspart  0-15 Units Subcutaneous TID WC   insulin aspart  0-5 Units Subcutaneous QHS   ketorolac  30 mg Intravenous Q6H   labetalol  10 mg Intravenous Once   levothyroxine  50 mcg Oral Daily   metoCLOPramide (REGLAN) injection  10 mg Intravenous Q6H   pantoprazole (PROTONIX) IV  40 mg Intravenous Daily   prazosin  2 mg Oral QHS   sodium chloride flush  3 mL Intravenous Q12H   traZODone  150 mg Oral QHS   Continuous Infusions:  lactated ringers 125 mL/hr at 10/04/21 1217   potassium chloride 10 mEq (10/04/21 1317)   PRN Meds:.acetaminophen **OR** acetaminophen, bisacodyl, ondansetron **OR** ondansetron (ZOFRAN) IV, polyethylene glycol, promethazine  ASSESMENT:   Acute flare of chronic intermittent intractable nausea, vomiting, abdominal pain.  Continues drug-seeking behavior.  Narcotics dependent back pain, managed by pain clinic.  Hypokalemia.  Mild elevation in LFTs.  Hepatic steatosis per CT.  Platelets above normal  Leukocytosis.  No fever  Hypertension.  Persist despite being restarted on terazosin and carvedilol.Marland Kitchen   PLAN     EGD tmrw.  D/W patient and she is agreeable to proceed.   Orders placed in depot.    Azucena Freed  10/04/2021, 1:30 PM Phone [phone]

## 2021-10-05 ENCOUNTER — Inpatient Hospital Stay (HOSPITAL_COMMUNITY): Payer: Medicare Other | Admitting: Certified Registered"

## 2021-10-05 ENCOUNTER — Encounter (HOSPITAL_COMMUNITY)
Admission: EM | Disposition: A | Discharge status: Home / Self Care | Payer: Self-pay | Source: Home / Self Care | Attending: Internal Medicine

## 2021-10-05 DIAGNOSIS — K2970 Gastritis, unspecified, without bleeding: Secondary | ICD-10-CM

## 2021-10-05 DIAGNOSIS — E119 Type 2 diabetes mellitus without complications: Secondary | ICD-10-CM

## 2021-10-05 DIAGNOSIS — E039 Hypothyroidism, unspecified: Secondary | ICD-10-CM

## 2021-10-05 DIAGNOSIS — I10 Essential (primary) hypertension: Secondary | ICD-10-CM

## 2021-10-05 DIAGNOSIS — K209 Esophagitis, unspecified without bleeding: Secondary | ICD-10-CM

## 2021-10-05 HISTORY — PX: ESOPHAGOGASTRODUODENOSCOPY (EGD) WITH PROPOFOL: SHX5813

## 2021-10-05 HISTORY — PX: BIOPSY: SHX5522

## 2021-10-05 LAB — CBC
HCT: 39.6 % (ref 36.0–46.0)
Hemoglobin: 13.2 g/dL (ref 12.0–15.0)
MCH: 30.3 pg (ref 26.0–34.0)
MCHC: 33.3 g/dL (ref 30.0–36.0)
MCV: 90.8 fL (ref 80.0–100.0)
Platelets: 263 10*3/uL (ref 150–400)
RBC: 4.36 MIL/uL (ref 3.87–5.11)
RDW: 14.3 % (ref 11.5–15.5)
WBC: 9.5 10*3/uL (ref 4.0–10.5)
nRBC: 0 % (ref 0.0–0.2)

## 2021-10-05 LAB — BASIC METABOLIC PANEL
Anion gap: 11 (ref 5–15)
BUN: <5 mg/dL — ABNORMAL LOW (ref 6–20)
CO2: 24 mmol/L (ref 22–32)
Calcium: 8.6 mg/dL — ABNORMAL LOW (ref 8.9–10.3)
Chloride: 103 mmol/L (ref 98–111)
Creatinine, Ser: 0.67 mg/dL (ref 0.44–1.00)
GFR, Estimated: >60 mL/min (ref >60)
Glucose, Bld: 107 mg/dL — ABNORMAL HIGH (ref 70–99)
Potassium: 3.3 mmol/L — ABNORMAL LOW (ref 3.5–5.1)
Sodium: 138 mmol/L (ref 135–145)

## 2021-10-05 LAB — GLUCOSE, CAPILLARY
Glucose-Capillary: 91 mg/dL (ref 70–99)
Glucose-Capillary: 99 mg/dL (ref 70–99)
Glucose-Capillary: 108 mg/dL — ABNORMAL HIGH (ref 70–99)
Glucose-Capillary: 110 mg/dL — ABNORMAL HIGH (ref 70–99)

## 2021-10-05 LAB — MAGNESIUM: Magnesium: 1.7 mg/dL (ref 1.7–2.4)

## 2021-10-05 LAB — TSH: TSH: 0.862 u[IU]/mL (ref 0.350–4.500)

## 2021-10-05 SURGERY — ESOPHAGOGASTRODUODENOSCOPY (EGD) WITH PROPOFOL
Anesthesia: Monitor Anesthesia Care

## 2021-10-05 MED ORDER — LABETALOL HCL 5 MG/ML IV SOLN
INTRAVENOUS | Status: DC | PRN
  Administered 2021-10-05: 10 mg via INTRAVENOUS

## 2021-10-05 MED ORDER — LIDOCAINE 2% (20 MG/ML) 5 ML SYRINGE
INTRAMUSCULAR | Status: DC | PRN
  Administered 2021-10-05: 100 mg via INTRAVENOUS

## 2021-10-05 MED ORDER — PROPOFOL 10 MG/ML IV BOLUS
INTRAVENOUS | Status: DC | PRN
  Administered 2021-10-05: 40 mg via INTRAVENOUS

## 2021-10-05 MED ORDER — LABETALOL HCL 5 MG/ML IV SOLN
INTRAVENOUS | Status: AC
  Filled 2021-10-05: qty 4

## 2021-10-05 MED ORDER — POTASSIUM CHLORIDE 10 MEQ/100ML IV SOLN
10.0000 meq | INTRAVENOUS | Status: AC
  Administered 2021-10-05 (×4): 10 meq via INTRAVENOUS
  Filled 2021-10-05 (×2): qty 100

## 2021-10-05 MED ORDER — PROPOFOL 500 MG/50ML IV EMUL
INTRAVENOUS | Status: DC | PRN
  Administered 2021-10-05: 150 ug/kg/min via INTRAVENOUS

## 2021-10-05 SURGICAL SUPPLY — 15 items

## 2021-10-05 NOTE — Anesthesia Preprocedure Evaluation (Signed)
Anesthesia Evaluation  Patient identified by MRN, date of birth, ID band Patient awake    Reviewed: Allergy & Precautions, H&P , NPO status , Patient's Chart, lab work & pertinent test results  Airway Mallampati: II   Neck ROM: full    Dental   Pulmonary neg pulmonary ROS,    breath sounds clear to auscultation       Cardiovascular hypertension,  Rhythm:regular Rate:Normal     Neuro/Psych  Headaches, PSYCHIATRIC DISORDERS Anxiety Depression Bipolar Disorder  Neuromuscular disease    GI/Hepatic Nausea, abdominal pain   Endo/Other  diabetes, Type 2Hypothyroidism   Renal/GU      Musculoskeletal  (+) Arthritis ,   Abdominal   Peds  Hematology   Anesthesia Other Findings   Reproductive/Obstetrics                             Anesthesia Physical Anesthesia Plan  ASA: 3  Anesthesia Plan: MAC   Post-op Pain Management:    Induction: Intravenous  PONV Risk Score and Plan: 2 and Propofol infusion and Treatment may vary due to age or medical condition  Airway Management Planned: Nasal Cannula  Additional Equipment:   Intra-op Plan:   Post-operative Plan:   Informed Consent: I have reviewed the patients History and Physical, chart, labs and discussed the procedure including the risks, benefits and alternatives for the proposed anesthesia with the patient or authorized representative who has indicated his/her understanding and acceptance.     Dental advisory given  Plan Discussed with: CRNA, Anesthesiologist and Surgeon  Anesthesia Plan Comments:         Anesthesia Quick Evaluation

## 2021-10-05 NOTE — Interval H&P Note (Signed)
History and Physical Interval Note:  10/05/2021 11:19 AM  [name]  has presented today for surgery, with the diagnosis of Nausea, vomiting, abdominal pain.  Acute on chronic..  The various methods of treatment have been discussed with the patient and family. After consideration of risks, benefits and other options for treatment, the patient has consented to  Procedure(s): ESOPHAGOGASTRODUODENOSCOPY (EGD) WITH PROPOFOL (N/A) as a surgical intervention.  The patient's history has been reviewed, patient examined, no change in status, stable for surgery.  I have reviewed the patient's chart and labs.  Questions were answered to the patient's satisfaction.     Silvano Rusk

## 2021-10-05 NOTE — Op Note (Signed)
[facility] Patient Name: [name] Procedure Date : 10/05/2021 MRN: [id number] Attending MD: Gatha Mayer , MD Date of Birth: Dec 08, 1970 CSN: [id number] Age: 51 Admit Type: Inpatient Procedure:                Upper GI endoscopy Indications:              Persistent vomiting Providers:                Gatha Mayer, MD, Burtis Junes, RN, Frazier Richards,                            Technician Referring MD:              Medicines:                Monitored Anesthesia Care Complications:            No immediate complications. Estimated Blood Loss:     Estimated blood loss was minimal. Procedure:                Pre-Anesthesia Assessment:                           - Prior to the procedure, a History and Physical                            was performed, and patient medications and                            allergies were reviewed. The patient's tolerance of                            previous anesthesia was also reviewed. The risks                            and benefits of the procedure and the sedation                            options and risks were discussed with the patient.                            All questions were answered, and informed consent                            was obtained. Prior Anticoagulants: The patient has                            taken no previous anticoagulant or antiplatelet                            agents. ASA Grade Assessment: III - A patient with                            severe systemic disease. After reviewing the risks  and benefits, the patient was deemed in                            satisfactory condition to undergo the procedure.                           After obtaining informed consent, the endoscope was                            passed under direct vision. Throughout the                            procedure, the patient's blood pressure, pulse, and                            oxygen saturations were  monitored continuously. The                            [id number]-H190 ([id number]) Olympus endoscope was introduced                            through the mouth, and advanced to the second part                            of duodenum. The upper GI endoscopy was                            accomplished without difficulty. The patient                            tolerated the procedure well. Scope In: Scope Out: Findings:      The Z-line was irregular and was found at the gastroesophageal junction.       Some associated exudate. This was biopsied with a cold forceps for       histology. Verification of patient identification for the specimen was       done. Estimated blood loss was minimal.      A small hiatal hernia was present.      Patchy moderate inflammation characterized by congestion (edema),       erythema and granularity was found in the gastric antrum.      The exam was otherwise without abnormality.      The cardia and gastric fundus were normal on retroflexion. Impression:               - Z-line irregular, at the gastroesophageal                            junction. Some exudate - ? adherent material This                            does not meet criteria for Barrett's esophagus (> 1                            cm columnar change) Biopsied given past intestinal  metaplasia.                           - Small hiatal hernia.                           - Gastritis.                           - The examination was otherwise normal. Recommendation:           - Patient has a contact number available for                            emergencies. The signs and symptoms of potential                            delayed complications were discussed with the                            patient. Return to normal activities tomorrow.                            Written discharge instructions were provided to the                            patient.                           - Advance  diet as tolerated.                           - Continue present medications.                           - She seems better - stay on toradol, advance diet                            and convert to po meds                           She needs promethazine suppositories at home to try                            to abort episodes                           will f/u path and see her tomorrow Procedure Code(s):        --- Professional ---                           [phone], Esophagogastroduodenoscopy, flexible,                            transoral; with biopsy, single or multiple Diagnosis Code(s):        --- Professional ---  K22.8, Other specified diseases of esophagus                           K44.9, Diaphragmatic hernia without obstruction or                            gangrene                           K29.70, Gastritis, unspecified, without bleeding                           Y80.16, Cyclical vomiting syndrome unrelated to                            migraine CPT copyright 2019 [employer]. All rights reserved. The codes documented in this report are preliminary and upon coder review may  be revised to meet current compliance requirements. Gatha Mayer, MD 10/05/2021 12:25:18 PM This report has been signed electronically. Number of Addenda: 0

## 2021-10-05 NOTE — Progress Notes (Signed)
PROGRESS NOTE  ANEESHA [name]  JQZ:[id number] DOB: 19-Sep-1970 DOA: 10/03/2021 PCP: Ferd Hibbs, NP   Brief Narrative:  Patient is a 51 year old female with history of chronic pain syndrome, drug-seeking behavior, Barrett's esophagus, hypertension, hyperlipidemia, PTSD/anxiety/depression who presented with intractable nausea, vomiting.  Fourth hospitalization for the same.  She was last admitted on 5/29 and was discharged on 6/1.  At that time GI was consulted and there was plan for gastric emptying, EGD.  She improved with Reglan and was discharged.  On presentation lab work showed AKI with creatinine of 1.2, leukocytosis.  GI consulted ,underwent EGD.  Assessment & Plan:  Principal Problem:   Intractable nausea and vomiting Active Problems:   T2DM (type 2 diabetes mellitus) (HCC)   Essential hypertension   AKI (acute kidney injury) ([place])   Hyperlipidemia   Chronic pain disorder   Hypothyroidism   Class 2 obesity due to excess calories with body mass index (BMI) of 37.0 to 62.2 in adult   Cyclic vomiting syndrome: Multiple admissions for the same.  Recent admission in May.  Presented with intractable nausea, vomiting.  Unclear etiology but gastroparesis suspected.  On her last admission, she was referred for outpatient gastric emptying scan.  GI already following here. Started  IV fluids, symptom management with antiemetics.Underwent EGD with finding of gastritis,small hiatal hernia.GI recommended promethazine suppositories  AKI: Resolved with IV fluids  Diabetes type 2: Recent hemoglobin A1c of 7.2.  On metformin at home.  Continue sliding scale insulin  Hypertension: Severely hypertensive on admission.  Continue carvedilol, prazosin.  Amlod added.Continue as needed medications for severe hypertension  Severe abdomen pain/chronic pain syndrome: High risk for opioid abuse, overdose.  Takes oxycodone at home for back pain.IV opiods d/ced   Mood disorder: On Abilify,  fluoxetine,razodone at home  Hyperlipidemia: Lipitor  Hypothyroidism: On Synthyroid  Hypokalemia: Supplemented and will be monitored  Leukocytosis: Unclear etiology.  Most likely reactive.  No acute findings in the CT abdomen/pelvis.Resolved now  Obesity:  BMI of 32.1       Nutrition Problem: Inadequate oral intake Etiology: nausea, vomiting    DVT prophylaxis:enoxaparin (LOVENOX) injection 40 mg Start: 10/03/21 1200     Code Status: Full Code  Family Communication: None at bedside  Patient status:Inpatient  Patient is from :Home  Anticipated discharge QJ:FHLK  Estimated DC date:tomorrow   Consultants: GI  Procedures:None  Antimicrobials:  Anti-infectives (From admission, onward)    None       Subjective:  Patient seen and examined at bedside this mrng. She feels better today.She mentioned improvement in abd pain,nausea. Waiting for EGD.Looked sleepy/drowsy so dilaudid d/ced   Objective: Vitals:   10/05/21 1043 10/05/21 1210 10/05/21 1225 10/05/21 1243  BP: (!) 195/112 135/75 (!) 156/82 (!) 157/98  Pulse: (!) 111 (!) 103 (!) 104 100  Resp: '20 19 17 18  '$ Temp:  98.3 F (36.8 C) 98.3 F (36.8 C) 98.2 F (36.8 C)  TempSrc:    Oral  SpO2: 95% 97% 96% 96%  Weight:      Height:        Intake/Output Summary (Last 24 hours) at 10/05/2021 1435 Last data filed at 10/05/2021 1208 Gross per 24 hour  Intake 3208.89 ml  Output 600 ml  Net 2608.89 ml   Filed Weights   10/03/21 1323 10/05/21 0500  Weight: 95.4 kg 97.1 kg    Examination:  General exam: Overall comfortable, not in distress,sleepy/drowsy HEENT: PERRL Respiratory system:  no wheezes or crackles  Cardiovascular system: S1 &  S2 heard, RRR.  Gastrointestinal system: Abdomen is nondistended, soft and nontender. Central nervous system: Alert and oriented Extremities: No edema, no clubbing ,no cyanosis Skin: No rashes, no ulcers,no icterus     Data Reviewed: I have personally reviewed  following labs and imaging studies  CBC: Recent Labs  Lab 10/03/21 0329 10/03/21 0720 10/04/21 0234 10/05/21 0617  WBC 18.2*  --  19.1* 9.5  NEUTROABS 15.0*  --   --   --   HGB 16.8* 17.7* 14.6 13.2  HCT 50.6* 52.0* 43.5 39.6  MCV 91.7  --  89.1 90.8  PLT 536*  --  426* 720   Basic Metabolic Panel: Recent Labs  Lab 10/03/21 0329 10/03/21 0720 10/04/21 0234 10/05/21 0617  NA 136 134* 142 138  K 4.6 4.8 3.3* 3.3*  CL 100  --  108 103  CO2 16*  --  18* 24  GLUCOSE 184*  --  127* 107*  BUN 10  --  <5* <5*  CREATININE 1.21*  --  0.82 0.67  CALCIUM 9.8  --  9.4 8.6*  MG  --   --   --  1.7     No results found for this or any previous visit (from the past 240 hour(s)).   Radiology Studies: No results found.  Scheduled Meds:  amLODipine  10 mg Oral Daily   ARIPiprazole  5 mg Oral Daily   atorvastatin  10 mg Oral Daily   capsicum   Topical BID   carvedilol  12.5 mg Oral BID WC   docusate sodium  100 mg Oral BID   enoxaparin (LOVENOX) injection  40 mg Subcutaneous Daily   FLUoxetine  40 mg Oral Daily   HYDROcodone-acetaminophen  1 tablet Oral 5 X Daily   insulin aspart  0-15 Units Subcutaneous TID WC   insulin aspart  0-5 Units Subcutaneous QHS   ketorolac  30 mg Intravenous Q6H   levothyroxine  50 mcg Oral Daily   metoCLOPramide (REGLAN) injection  10 mg Intravenous Q6H   pantoprazole (PROTONIX) IV  40 mg Intravenous Daily   prazosin  2 mg Oral QHS   sodium chloride flush  3 mL Intravenous Q12H   traZODone  150 mg Oral QHS   Continuous Infusions:  lactated ringers 125 mL/hr at 10/05/21 1145     LOS: 1 day   Shelly Coss, MD [facility] P6/15/2023, 2:35 PM

## 2021-10-05 NOTE — Transfer of Care (Signed)
Immediate Anesthesia Transfer of Care Note  Patient: [name]  Procedure(s) Performed: ESOPHAGOGASTRODUODENOSCOPY (EGD) WITH PROPOFOL BIOPSY  Patient Location: PACU  Anesthesia Type:MAC  Level of Consciousness: drowsy  Airway & Oxygen Therapy: Patient Spontanous Breathing and Patient connected to nasal cannula oxygen  Post-op Assessment: Report given to RN and Post -op Vital signs reviewed and stable  Post vital signs: Reviewed and stable  Last Vitals:  Vitals Value Taken Time  BP 135/75 10/05/21 1207  Temp    Pulse 102 10/05/21 1208  Resp 25 10/05/21 1208  SpO2 95 % 10/05/21 1208  Vitals shown include unvalidated device data.  Last Pain:  Vitals:   10/05/21 1043  TempSrc:   PainSc: 0-No pain      Patients Stated Pain Goal: 2 (88/11/03 1594)  Complications: No notable events documented.

## 2021-10-05 NOTE — Anesthesia Procedure Notes (Addendum)
Procedure Name: General with mask airway Date/Time: 10/05/2021 11:50 AM  Performed by: Erick Colace, CRNAPre-anesthesia Checklist: Patient identified, Emergency Drugs available, Suction available and Patient being monitored Patient Re-evaluated:Patient Re-evaluated prior to induction Oxygen Delivery Method: Simple face mask Preoxygenation: Pre-oxygenation with 100% oxygen Induction Type: IV induction Airway Equipment and Method: Bite block Dental Injury: Teeth and Oropharynx as per pre-operative assessment

## 2021-10-06 ENCOUNTER — Other Ambulatory Visit (HOSPITAL_COMMUNITY): Payer: Self-pay

## 2021-10-06 ENCOUNTER — Encounter: Payer: Self-pay | Admitting: Internal Medicine

## 2021-10-06 LAB — BASIC METABOLIC PANEL
Anion gap: 9 (ref 5–15)
BUN: 5 mg/dL — ABNORMAL LOW (ref 6–20)
CO2: 24 mmol/L (ref 22–32)
Calcium: 8.7 mg/dL — ABNORMAL LOW (ref 8.9–10.3)
Chloride: 109 mmol/L (ref 98–111)
Creatinine, Ser: 0.76 mg/dL (ref 0.44–1.00)
GFR, Estimated: >60 mL/min (ref >60)
Glucose, Bld: 124 mg/dL — ABNORMAL HIGH (ref 70–99)
Potassium: 4.7 mmol/L (ref 3.5–5.1)
Sodium: 142 mmol/L (ref 135–145)

## 2021-10-06 LAB — GLUCOSE, CAPILLARY: Glucose-Capillary: 90 mg/dL (ref 70–99)

## 2021-10-06 LAB — SURGICAL PATHOLOGY

## 2021-10-06 MED ORDER — TIZANIDINE HCL 4 MG PO TABS: 12.0000 mg | ORAL_TABLET | Freq: Three times a day (TID) | ORAL | 0 refills | Status: DC | PRN

## 2021-10-06 MED ORDER — AMLODIPINE BESYLATE 10 MG PO TABS
10.0000 mg | ORAL_TABLET | Freq: Every day | ORAL | 1 refills | Status: DC
  Filled 2021-10-06: qty 30, 30d supply, fill #0

## 2021-10-06 MED ORDER — CARVEDILOL 12.5 MG PO TABS: 12.5000 mg | ORAL_TABLET | Freq: Two times a day (BID) | ORAL | 0 refills | Status: DC

## 2021-10-06 MED ORDER — INSULIN PEN NEEDLE 32G X 4 MM MISC
0 refills | Status: DC
  Filled 2021-10-06: qty 100, 30d supply, fill #0

## 2021-10-06 MED ORDER — ACCU-CHEK FASTCLIX LANCETS MISC
0 refills | Status: DC
  Filled 2021-10-06: qty 100, 30d supply, fill #0

## 2021-10-06 MED ORDER — PROMETHAZINE HCL 25 MG RE SUPP
25.0000 mg | Freq: Four times a day (QID) | RECTAL | 0 refills | Status: DC | PRN
  Filled 2021-10-06: qty 12, 3d supply, fill #0

## 2021-10-06 MED ORDER — ACCU-CHEK GUIDE W/DEVICE KIT
1.0000 | PACK | Freq: Every day | 0 refills | Status: DC
  Filled 2021-10-06: qty 1, 30d supply, fill #0

## 2021-10-06 MED ORDER — LISINOPRIL 10 MG PO TABS
10.0000 mg | ORAL_TABLET | Freq: Every day | ORAL | 1 refills | Status: DC
  Filled 2021-10-06: qty 30, 30d supply, fill #0

## 2021-10-06 MED ORDER — GLUCOSE BLOOD VI STRP
ORAL_STRIP | 0 refills | Status: DC
  Filled 2021-10-06: qty 100, 30d supply, fill #0

## 2021-10-06 NOTE — Plan of Care (Signed)
  Problem: Education: Goal: Knowledge of General Education information will improve Description: Including pain rating scale, medication(s)/side effects and non-pharmacologic comfort measures 10/06/2021 1133 by Westley Foots, RN Outcome: Adequate for Discharge 10/06/2021 1113 by Westley Foots, RN Outcome: Progressing   Problem: Health Behavior/Discharge Planning: Goal: Ability to manage health-related needs will improve 10/06/2021 1133 by Westley Foots, RN Outcome: Adequate for Discharge 10/06/2021 1113 by Westley Foots, RN Outcome: Progressing   Problem: Clinical Measurements: Goal: Ability to maintain clinical measurements within normal limits will improve 10/06/2021 1133 by Westley Foots, RN Outcome: Adequate for Discharge 10/06/2021 1113 by Westley Foots, RN Outcome: Progressing Goal: Will remain free from infection 10/06/2021 1133 by Westley Foots, RN Outcome: Adequate for Discharge 10/06/2021 1113 by Westley Foots, RN Outcome: Progressing Goal: Diagnostic test results will improve 10/06/2021 1133 by Westley Foots, RN Outcome: Adequate for Discharge 10/06/2021 1113 by Westley Foots, RN Outcome: Progressing Goal: Respiratory complications will improve 10/06/2021 1133 by Westley Foots, RN Outcome: Adequate for Discharge 10/06/2021 1113 by Westley Foots, RN Outcome: Progressing Goal: Cardiovascular complication will be avoided 10/06/2021 1133 by Westley Foots, RN Outcome: Adequate for Discharge 10/06/2021 1113 by Westley Foots, RN Outcome: Progressing   Problem: Activity: Goal: Risk for activity intolerance will decrease 10/06/2021 1133 by Westley Foots, RN Outcome: Adequate for Discharge 10/06/2021 1113 by Westley Foots, RN Outcome: Progressing   Problem: Nutrition: Goal: Adequate nutrition will be maintained 10/06/2021 1133 by Westley Foots, RN Outcome: Adequate for Discharge 10/06/2021 1113 by Westley Foots, RN Outcome: Progressing   Problem: Coping: Goal: Level of anxiety will decrease 10/06/2021 1133 by Westley Foots,  RN Outcome: Adequate for Discharge 10/06/2021 1113 by Westley Foots, RN Outcome: Progressing   Problem: Elimination: Goal: Will not experience complications related to bowel motility 10/06/2021 1133 by Westley Foots, RN Outcome: Adequate for Discharge 10/06/2021 1113 by Westley Foots, RN Outcome: Progressing Goal: Will not experience complications related to urinary retention 10/06/2021 1133 by Westley Foots, RN Outcome: Adequate for Discharge 10/06/2021 1113 by Westley Foots, RN Outcome: Progressing   Problem: Pain Managment: Goal: General experience of comfort will improve 10/06/2021 1133 by Westley Foots, RN Outcome: Adequate for Discharge 10/06/2021 1113 by Westley Foots, RN Outcome: Progressing   Problem: Safety: Goal: Ability to remain free from injury will improve 10/06/2021 1133 by Westley Foots, RN Outcome: Adequate for Discharge 10/06/2021 1113 by Westley Foots, RN Outcome: Progressing   Problem: Skin Integrity: Goal: Risk for impaired skin integrity will decrease 10/06/2021 1133 by Westley Foots, RN Outcome: Adequate for Discharge 10/06/2021 1113 by Westley Foots, RN Outcome: Progressing

## 2021-10-06 NOTE — Plan of Care (Signed)

## 2021-10-06 NOTE — Discharge Summary (Signed)
Physician Discharge Summary  [name] MPN:[id number] DOB: [birth year] DOA: 10/03/2021  PCP: Ferd Hibbs, NP  Admit date: 10/03/2021 Discharge date: 10/06/2021  Admitted From: Home Disposition:  Home  Discharge Condition:Stable CODE STATUS:FULL Diet recommendation: Heart Healthy   Brief/Interim Summary:  Patient is a 51 year old female with history of chronic pain syndrome, drug-seeking behavior, Barrett's esophagus, hypertension, hyperlipidemia, PTSD/anxiety/depression who presented with intractable nausea, vomiting.  Fourth hospitalization for the same.  She was last admitted on 5/29 and was discharged on 6/1.  At that time GI was consulted and there was plan for gastric emptying, EGD.  She improved with Reglan and was discharged.  On presentation lab work showed AKI with creatinine of 1.2, leukocytosis.  Hospital course remarkable for persistent abdomen pain, nausea, hypertension.  GI consulted ,underwent EGD without any significant findings of gastritis.  Her abdominal pain, nausea have improved with promethazine.  Blood pressure has been slightly better with medications adjustment.  GI cleared for discharge.  Medically stable for discharge today.  Following problems were addressed during her hospitalization:  Cyclic vomiting syndrome: Multiple admissions for the same.  Recent admission in May.  Presented with intractable nausea, vomiting.  Unclear etiology but gastroparesis suspected.  On her last admission, she was referred for outpatient gastric emptying scan. Underwent EGD with finding of gastritis,small hiatal hernia.GI recommended promethazine suppositories.   AKI: Resolved with IV fluids   Diabetes type 2: Recent hemoglobin A1c of 7.2.  On metformin at home.     Hypertension: Severely hypertensive on admission.  Continue carvedilol, prazosin.  Amlod ,lisinopril added.   Severe abdomen pain/chronic pain syndrome: High risk for opioid abuse, overdose.  Takes oxycodone at  home for back pain.IV opiods d/ced    Mood disorder: On Abilify, fluoxetine,razodone at home   Hyperlipidemia: Lipitor   Hypothyroidism: On Synthyroid   Hypokalemia: Supplemented and corrected   Leukocytosis: Unclear etiology.  Most likely reactive.  No acute findings in the CT abdomen/pelvis.Resolved now   Obesity:  BMI of 32.1     Discharge Diagnoses:  Principal Problem:   Intractable nausea and vomiting Active Problems:   T2DM (type 2 diabetes mellitus) (HCC)   Essential hypertension   AKI (acute kidney injury) ([facility])   Hyperlipidemia   Chronic pain disorder   Hypothyroidism   Class 2 obesity due to excess calories with body mass index (BMI) of 37.0 to 37.9 in adult    Discharge Instructions  Discharge Instructions     Diet - low sodium heart healthy   Complete by: As directed    Discharge instructions   Complete by: As directed    1)Please take prescribed medications as instructed 2)Follow up with your PCP in a week 2)Monitor your blood pressure at home   Increase activity slowly   Complete by: As directed    No wound care   Complete by: As directed       Allergies as of 10/06/2021   No Known Allergies      Medication List     STOP taking these medications    metoCLOPramide 5 MG tablet Commonly known as: Reglan   promethazine 25 MG tablet Commonly known as: PHENERGAN Replaced by: promethazine 25 MG suppository       TAKE these medications    acetaminophen 325 MG tablet Commonly known as: TYLENOL Take 2 tablets (650 mg total) by mouth every 6 (six) hours as needed for mild pain, fever or headache. What changed: when to take this   amLODipine 10 MG  tablet Commonly known as: NORVASC Take 1 tablet (10 mg total) by mouth daily. Start taking on: October 07, 2021   ARIPiprazole 5 MG tablet Commonly known as: ABILIFY Take 1 tablet (5 mg total) by mouth daily.   atorvastatin 10 MG tablet Commonly known as: LIPITOR Take 10 mg by mouth  daily.   BENADRYL PO Take 75 mg by mouth at bedtime.   blood glucose meter kit and supplies Kit Dispense based on patient and insurance preference. Use up to four times daily as directed.   Blood Pressure Kit 1 each by Does not apply route daily.   carvedilol 12.5 MG tablet Commonly known as: COREG Take 1 tablet (12.5 mg total) by mouth 2 (two) times daily with a meal. What changed:  medication strength how much to take   DIARRHEA PO Take 2-3 tablets by mouth See admin instructions. Take 2-3 tablets by mouth as needed for diarrhea. Unknown frequency   FLUoxetine 40 MG capsule Commonly known as: PROZAC Take 1 capsule (40 mg total) by mouth daily.   levothyroxine 50 MCG tablet Commonly known as: SYNTHROID Take 50 mcg by mouth daily.   lisinopril 10 MG tablet Commonly known as: ZESTRIL Take 1 tablet (10 mg total) by mouth daily.   metFORMIN 500 MG tablet Commonly known as: GLUCOPHAGE Take 500 mg by mouth 2 (two) times daily with a meal.   multivitamin Tabs tablet Take 1 tablet by mouth at bedtime.   ondansetron 4 MG disintegrating tablet Commonly known as: ZOFRAN-ODT Take 1 tablet (4 mg total) by mouth every 8 (eight) hours as needed for nausea or vomiting. What changed: when to take this   oxyCODONE-acetaminophen 7.5-325 MG tablet Commonly known as: PERCOCET Take 1 tablet by mouth See admin instructions. Take 7.5 - 325 mg (1-2 tab) 5- times daily   pantoprazole 40 MG tablet Commonly known as: Protonix Take 1 tablet (40 mg total) by mouth daily.   prazosin 2 MG capsule Commonly known as: MINIPRESS Take 1 capsule (2 mg total) by mouth at bedtime.   promethazine 25 MG suppository Commonly known as: PHENERGAN Place 1 suppository (25 mg total) rectally every 6 (six) hours as needed for refractory nausea / vomiting. Replaces: promethazine 25 MG tablet   tiZANidine 4 MG tablet Commonly known as: ZANAFLEX Take 3 tablets (12 mg total) by mouth every 8 (eight)  hours as needed for muscle spasms. What changed:  when to take this reasons to take this   traZODone 50 MG tablet Commonly known as: DESYREL Take 3 tablets (150 mg total) by mouth at bedtime.        Follow-up Information     Ferd Hibbs, NP. Schedule an appointment as soon as possible for a visit in 1 week(s).   Specialty: Nurse Practitioner Contact information: [address] [place] [address] [phone]                No Known Allergies  Consultations: GI   Procedures/Studies: CT ABDOMEN PELVIS W CONTRAST  Result Date: 10/03/2021 CLINICAL DATA:  50 year old female with abdominal pain, nausea vomiting. EXAM: CT ABDOMEN AND PELVIS WITH CONTRAST TECHNIQUE: Multidetector CT imaging of the abdomen and pelvis was performed using the standard protocol following bolus administration of intravenous contrast. RADIATION DOSE REDUCTION: This exam was performed according to the departmental dose-optimization program which includes automated exposure control, adjustment of the mA and/or kV according to patient size and/or use of iterative reconstruction technique. CONTRAST:  175m OMNIPAQUE IOHEXOL 300 MG/ML  SOLN COMPARISON:  CT Abdomen and Pelvis 09/18/2021 and earlier. FINDINGS: Lower chest: Stable elevation of the right hemidiaphragm, otherwise negative. Hepatobiliary: Hepatic steatosis redemonstrated. Otherwise negative liver and gallbladder. Pancreas: Stable partial atrophy. Spleen: Negative. Adrenals/Urinary Tract: Stable, negative. Stomach/Bowel: Most of the large bowel is decompressed. Normal retrocecal appendix on series 3, image 61. No convincing large bowel inflammation. Decompressed terminal ileum and no dilated small bowel. Negative stomach. Decompressed duodenum. No free air, free fluid, or mesenteric inflammation identified. Vascular/Lymphatic: Minor aortic atherosclerosis. Major arterial structures in the abdomen and pelvis are patent. Portal venous system  appears patent. No lymphadenopathy. Reproductive: Negative. Other: No pelvic free fluid. Musculoskeletal: Chronic lower thoracic and mid lumbar endplate Schmorl's nodes. Chronic bilateral pubic rami fractures. No acute osseous abnormality identified. IMPRESSION: 1. Stable CT abdomen and pelvis from last month. No acute or inflammatory process identified. Normal appendix. 2. Hepatic steatosis. Electronically Signed   By: Genevie Ann M.D.   On: 10/03/2021 06:51   CT ABDOMEN PELVIS W CONTRAST  Result Date: 09/18/2021 CLINICAL DATA:  Diffuse abdominal pain. EXAM: CT ABDOMEN AND PELVIS WITH CONTRAST TECHNIQUE: Multidetector CT imaging of the abdomen and pelvis was performed using the standard protocol following bolus administration of intravenous contrast. RADIATION DOSE REDUCTION: This exam was performed according to the departmental dose-optimization program which includes automated exposure control, adjustment of the mA and/or kV according to patient size and/or use of iterative reconstruction technique. CONTRAST:  156m OMNIPAQUE IOHEXOL 300 MG/ML  SOLN COMPARISON:  08/27/2021. FINDINGS: Lower chest: Clear lung bases. Hepatobiliary: Decreased liver attenuation consistent with fatty infiltration. Normal liver size. No mass or focal lesion. Normal gallbladder. No bile duct dilation. Pancreas: Unremarkable. No pancreatic ductal dilatation or surrounding inflammatory changes. Spleen: Normal in size without focal abnormality. Adrenals/Urinary Tract: Adrenal glands are unremarkable. Kidneys are normal, without renal calculi, focal lesion, or hydronephrosis. Bladder is unremarkable. Stomach/Bowel: Stomach is within normal limits. Appendix appears normal. No evidence of bowel wall thickening, distention, or inflammatory changes. Vascular/Lymphatic: Minimal aortic atherosclerosis. No aneurysm. Vasculature otherwise unremarkable. No enlarged lymph nodes. Reproductive: Uterus and bilateral adnexa are unremarkable. Other: No  abdominal wall hernia or abnormality. No abdominopelvic ascites. Musculoskeletal: No fracture or acute finding.  No bone lesion. IMPRESSION: 1. No acute findings within the abdomen or pelvis. Stable appearance from the prior abdomen and pelvis CT. 2. Hepatic steatosis. 3. Minimal aortic atherosclerosis. Electronically Signed   By: DLajean ManesM.D.   On: 09/18/2021 10:25   DG Chest Portable 1 View  Result Date: 09/18/2021 CLINICAL DATA:  Chest pain EXAM: PORTABLE CHEST 1 VIEW COMPARISON:  Radiograph 06/11/2020 FINDINGS: The cardiomediastinal silhouette is within normal limits. There is no focal airspace consolidation. There is no pleural effusion. No pneumothorax. There is no acute osseous abnormality. IMPRESSION: No evidence of acute cardiopulmonary disease. Electronically Signed   By: JMaurine SimmeringM.D.   On: 09/18/2021 09:11      Subjective: Patient seen and examined at the bedside this morning.  Hemodynamically stable for discharge today.  Discharge Exam: Vitals:   10/06/21 0445 10/06/21 0932  BP: (!) 150/93 (!) 164/102  Pulse: 99 88  Resp: 19 16  Temp: 98.3 F (36.8 C) 98.2 F (36.8 C)  SpO2: 97% 98%   Vitals:   10/05/21 2036 10/06/21 0445 10/06/21 0500 10/06/21 0932  BP: (!) 166/105 (!) 150/93  (!) 164/102  Pulse: (!) 104 99  88  Resp: 19 19  16   Temp: 97.7 F (36.5 C) 98.3 F (36.8 C)  98.2  F (36.8 C)  TempSrc: Oral Oral  Oral  SpO2: 95% 97%  98%  Weight:   98.3 kg   Height:        General: Pt is alert, awake, not in acute distress Cardiovascular: RRR, S1/S2 +, no rubs, no gallops Respiratory: CTA bilaterally, no wheezing, no rhonchi Abdominal: Soft, NT, ND, bowel sounds + Extremities: no edema, no cyanosis    The results of significant diagnostics from this hospitalization (including imaging, microbiology, ancillary and laboratory) are listed below for reference.     Microbiology: No results found for this or any previous visit (from the past 240 hour(s)).    Labs: BNP (last 3 results) No results for input(s): "BNP" in the last 8760 hours. Basic Metabolic Panel: Recent Labs  Lab 10/03/21 0329 10/03/21 0720 10/04/21 0234 10/05/21 0617 10/06/21 0307  NA 136 134* 142 138 142  K 4.6 4.8 3.3* 3.3* 4.7  CL 100  --  108 103 109  CO2 16*  --  18* 24 24  GLUCOSE 184*  --  127* 107* 124*  BUN 10  --  <5* <5* 5*  CREATININE 1.21*  --  0.82 0.67 0.76  CALCIUM 9.8  --  9.4 8.6* 8.7*  MG  --   --   --  1.7  --    Liver Function Tests: Recent Labs  Lab 10/03/21 0329  AST 57*  ALT 35  ALKPHOS 92  BILITOT 1.3*  PROT 8.3*  ALBUMIN 3.9   Recent Labs  Lab 10/03/21 0329  LIPASE 18   No results for input(s): "AMMONIA" in the last 168 hours. CBC: Recent Labs  Lab 10/03/21 0329 10/03/21 0720 10/04/21 0234 10/05/21 0617  WBC 18.2*  --  19.1* 9.5  NEUTROABS 15.0*  --   --   --   HGB 16.8* 17.7* 14.6 13.2  HCT 50.6* 52.0* 43.5 39.6  MCV 91.7  --  89.1 90.8  PLT 536*  --  426* 263   Cardiac Enzymes: No results for input(s): "CKTOTAL", "CKMB", "CKMBINDEX", "TROPONINI" in the last 168 hours. BNP: Invalid input(s): "POCBNP" CBG: Recent Labs  Lab 10/05/21 0727 10/05/21 1253 10/05/21 1649 10/05/21 2040 10/06/21 0715  GLUCAP 91 99 108* 110* 90   D-Dimer No results for input(s): "DDIMER" in the last 72 hours. Hgb A1c No results for input(s): "HGBA1C" in the last 72 hours. Lipid Profile No results for input(s): "CHOL", "HDL", "LDLCALC", "TRIG", "CHOLHDL", "LDLDIRECT" in the last 72 hours. Thyroid function studies Recent Labs    10/05/21 0617  TSH 0.862   Anemia work up No results for input(s): "VITAMINB12", "FOLATE", "FERRITIN", "TIBC", "IRON", "RETICCTPCT" in the last 72 hours. Urinalysis    Component Value Date/Time   COLORURINE AMBER (A) 09/18/2021 0813   APPEARANCEUR HAZY (A) 09/18/2021 0813   LABSPEC 1.024 09/18/2021 0813   PHURINE 5.0 09/18/2021 0813   GLUCOSEU NEGATIVE 09/18/2021 0813   HGBUR NEGATIVE  09/18/2021 0813   BILIRUBINUR NEGATIVE 09/18/2021 0813   KETONESUR 20 (A) 09/18/2021 0813   PROTEINUR 100 (A) 09/18/2021 0813   UROBILINOGEN 0.2 01/19/2015 0302   NITRITE NEGATIVE 09/18/2021 0813   LEUKOCYTESUR MODERATE (A) 09/18/2021 0813   Sepsis Labs Recent Labs  Lab 10/03/21 0329 10/04/21 0234 10/05/21 0617  WBC 18.2* 19.1* 9.5   Microbiology No results found for this or any previous visit (from the past 240 hour(s)).  Please note: You were cared for by a hospitalist during your hospital stay. Once you are discharged, your primary care physician  will handle any further medical issues. Please note that NO REFILLS for any discharge medications will be authorized once you are discharged, as it is imperative that you return to your primary care physician (or establish a relationship with a primary care physician if you do not have one) for your post hospital discharge needs so that they can reassess your need for medications and monitor your lab values.    Time coordinating discharge: 40 minutes  SIGNED:   Shelly Coss, MD  [facility] 10/06/2021, 10:13 AM Pager [phone]  If 7PM-7AM, please contact night-coverage [email or web address] Password [email or web address]

## 2021-10-06 NOTE — TOC Transition Note (Signed)
Transition of Care [facility]) - CM/SW Discharge Note   Patient Details  Name: [name] MRN: [id number] Date of Birth: 1970/05/15  Transition of Care [facility]) CM/SW Contact:  Tom-Johnson, Renea Ee, RN Phone Number: 10/06/2021, 10:32 AM   Clinical Narrative:     Patient is scheduled for discharge today. No TOC needs or recommendations noted. Readmit Prevention assessment done. Family to transport at discharge. No further TOC needs noted.   Final next level of care: Home/Self Care Barriers to Discharge: Barriers Resolved   Patient Goals and CMS Choice Patient states their goals for this hospitalization and ongoing recovery are:: To return home CMS Medicare.gov Compare Post Acute Care list provided to:: Patient Choice offered to / list presented to : NA  Discharge Placement                Patient to be transferred to facility by: Family      Discharge Plan and Services                DME Arranged: N/A DME Agency: NA       HH Arranged: NA HH Agency: NA        Social Determinants of Health (SDOH) Interventions     Readmission Risk Interventions    10/06/2021   10:23 AM 09/20/2021    3:41 PM 05/12/2020   12:39 PM  Readmission Risk Prevention Plan  Transportation Screening Complete Complete Complete  PCP or Specialist Appt within 3-5 Days Complete  Complete  HRI or [place] Complete  Complete  Social Work Consult for [place] Planning/Counseling Complete  Complete  Palliative Care Screening Not Applicable  Not Applicable  Medication Review [employer] photographer) Referral to Pharmacy Referral to Pharmacy Complete  HRI or [place]  Complete   SW Recovery Care/Counseling Consult  Complete   Palliative Care Screening  Not [place]  Not Applicable

## 2021-10-06 NOTE — Discharge Summary (Shared)
Patient shows no acute distress or pain upon discharge, husband present at the bedside, discharge instruction provided with AVS, patient shows good understanding. Belongins including Upper and lower dentures all taken home.

## 2021-10-08 ENCOUNTER — Encounter (HOSPITAL_COMMUNITY): Payer: Self-pay | Admitting: Internal Medicine

## 2021-10-09 ENCOUNTER — Ambulatory Visit: Payer: Medicare Other | Admitting: Nurse Practitioner

## 2021-10-10 ENCOUNTER — Encounter: Payer: Self-pay | Admitting: Internal Medicine

## 2021-10-11 NOTE — Anesthesia Postprocedure Evaluation (Signed)
Anesthesia Post Note  Patient: [name]  Procedure(s) Performed: ESOPHAGOGASTRODUODENOSCOPY (EGD) WITH PROPOFOL BIOPSY     Patient location during evaluation: Endoscopy Anesthesia Type: MAC Level of consciousness: awake and alert Pain management: pain level controlled Vital Signs Assessment: post-procedure vital signs reviewed and stable Respiratory status: spontaneous breathing, nonlabored ventilation, respiratory function stable and patient connected to nasal cannula oxygen Cardiovascular status: stable and blood pressure returned to baseline Postop Assessment: no apparent nausea or vomiting Anesthetic complications: no   No notable events documented.  Last Vitals:  Vitals:   10/06/21 0932 10/06/21 1042  BP: (!) 164/102 120/71  Pulse: 88 87  Resp: 16   Temp: 36.8 C   SpO2: 98%     Last Pain:  Vitals:   10/06/21 0932  TempSrc: Oral  PainSc:                  [name]

## 2021-10-13 ENCOUNTER — Encounter: Payer: Self-pay | Admitting: Internal Medicine

## 2021-10-13 ENCOUNTER — Inpatient Hospital Stay (HOSPITAL_COMMUNITY)
Admission: EM | Admit: 2021-10-13 | Discharge: 2021-10-15 | DRG: 896 | Disposition: A | Discharge status: Home / Self Care | Payer: Medicare Other | Attending: Family Medicine | Admitting: Family Medicine

## 2021-10-13 ENCOUNTER — Emergency Department (HOSPITAL_COMMUNITY): Payer: Medicare Other

## 2021-10-13 DIAGNOSIS — Z7984 Long term (current) use of oral hypoglycemic drugs: Secondary | ICD-10-CM

## 2021-10-13 DIAGNOSIS — F319 Bipolar disorder, unspecified: Secondary | ICD-10-CM | POA: Diagnosis present

## 2021-10-13 DIAGNOSIS — G9340 Encephalopathy, unspecified: Secondary | ICD-10-CM | POA: Diagnosis present

## 2021-10-13 DIAGNOSIS — G929 Unspecified toxic encephalopathy: Secondary | ICD-10-CM | POA: Diagnosis present

## 2021-10-13 DIAGNOSIS — G894 Chronic pain syndrome: Secondary | ICD-10-CM | POA: Diagnosis present

## 2021-10-13 DIAGNOSIS — F1910 Other psychoactive substance abuse, uncomplicated: Secondary | ICD-10-CM | POA: Diagnosis present

## 2021-10-13 DIAGNOSIS — Z8371 Family history of colonic polyps: Secondary | ICD-10-CM

## 2021-10-13 DIAGNOSIS — Z79899 Other long term (current) drug therapy: Secondary | ICD-10-CM

## 2021-10-13 DIAGNOSIS — M1990 Unspecified osteoarthritis, unspecified site: Secondary | ICD-10-CM | POA: Diagnosis present

## 2021-10-13 DIAGNOSIS — E860 Dehydration: Secondary | ICD-10-CM | POA: Diagnosis present

## 2021-10-13 DIAGNOSIS — M5030 Other cervical disc degeneration, unspecified cervical region: Secondary | ICD-10-CM | POA: Diagnosis present

## 2021-10-13 DIAGNOSIS — Z85828 Personal history of other malignant neoplasm of skin: Secondary | ICD-10-CM

## 2021-10-13 DIAGNOSIS — Z833 Family history of diabetes mellitus: Secondary | ICD-10-CM

## 2021-10-13 DIAGNOSIS — F19129 Other psychoactive substance abuse with intoxication, unspecified: Principal | ICD-10-CM | POA: Diagnosis present

## 2021-10-13 DIAGNOSIS — Z8674 Personal history of sudden cardiac arrest: Secondary | ICD-10-CM

## 2021-10-13 DIAGNOSIS — I10 Essential (primary) hypertension: Secondary | ICD-10-CM | POA: Diagnosis present

## 2021-10-13 DIAGNOSIS — Z79891 Long term (current) use of opiate analgesic: Secondary | ICD-10-CM

## 2021-10-13 DIAGNOSIS — R112 Nausea with vomiting, unspecified: Principal | ICD-10-CM | POA: Diagnosis present

## 2021-10-13 DIAGNOSIS — Z79890 Hormone replacement therapy (postmenopausal): Secondary | ICD-10-CM

## 2021-10-13 DIAGNOSIS — F4310 Post-traumatic stress disorder, unspecified: Secondary | ICD-10-CM | POA: Diagnosis present

## 2021-10-13 DIAGNOSIS — T43625A Adverse effect of amphetamines, initial encounter: Secondary | ICD-10-CM | POA: Diagnosis present

## 2021-10-13 DIAGNOSIS — Z803 Family history of malignant neoplasm of breast: Secondary | ICD-10-CM

## 2021-10-13 DIAGNOSIS — E8729 Other acidosis: Secondary | ICD-10-CM | POA: Diagnosis present

## 2021-10-13 DIAGNOSIS — Z765 Malingerer [conscious simulation]: Secondary | ICD-10-CM

## 2021-10-13 DIAGNOSIS — R4182 Altered mental status, unspecified: Secondary | ICD-10-CM | POA: Diagnosis not present

## 2021-10-13 DIAGNOSIS — D72829 Elevated white blood cell count, unspecified: Secondary | ICD-10-CM | POA: Diagnosis present

## 2021-10-13 DIAGNOSIS — E872 Acidosis, unspecified: Secondary | ICD-10-CM | POA: Diagnosis present

## 2021-10-13 DIAGNOSIS — E785 Hyperlipidemia, unspecified: Secondary | ICD-10-CM | POA: Diagnosis present

## 2021-10-13 DIAGNOSIS — E119 Type 2 diabetes mellitus without complications: Secondary | ICD-10-CM | POA: Diagnosis present

## 2021-10-13 LAB — CBC
HCT: 47.5 % — ABNORMAL HIGH (ref 36.0–46.0)
Hemoglobin: 15.9 g/dL — ABNORMAL HIGH (ref 12.0–15.0)
MCH: 31.1 pg (ref 26.0–34.0)
MCHC: 33.5 g/dL (ref 30.0–36.0)
MCV: 92.8 fL (ref 80.0–100.0)
Platelets: 432 10*3/uL — ABNORMAL HIGH (ref 150–400)
RBC: 5.12 MIL/uL — ABNORMAL HIGH (ref 3.87–5.11)
RDW: 14.4 % (ref 11.5–15.5)
WBC: 17.1 10*3/uL — ABNORMAL HIGH (ref 4.0–10.5)
nRBC: 0 % (ref 0.0–0.2)

## 2021-10-13 LAB — COMPREHENSIVE METABOLIC PANEL
ALT: 27 U/L (ref 0–44)
AST: 33 U/L (ref 15–41)
Albumin: 3.7 g/dL (ref 3.5–5.0)
Alkaline Phosphatase: 85 U/L (ref 38–126)
Anion gap: 23 — ABNORMAL HIGH (ref 5–15)
BUN: 7 mg/dL (ref 6–20)
CO2: 19 mmol/L — ABNORMAL LOW (ref 22–32)
Calcium: 9.8 mg/dL (ref 8.9–10.3)
Chloride: 103 mmol/L (ref 98–111)
Creatinine, Ser: 1.22 mg/dL — ABNORMAL HIGH (ref 0.44–1.00)
GFR, Estimated: 54 mL/min — ABNORMAL LOW (ref >60)
Glucose, Bld: 182 mg/dL — ABNORMAL HIGH (ref 70–99)
Potassium: 3.6 mmol/L (ref 3.5–5.1)
Sodium: 145 mmol/L (ref 135–145)
Total Bilirubin: 1.3 mg/dL — ABNORMAL HIGH (ref 0.3–1.2)
Total Protein: 7.9 g/dL (ref 6.5–8.1)

## 2021-10-13 LAB — URINALYSIS, ROUTINE W REFLEX MICROSCOPIC
Bilirubin Urine: NEGATIVE
Glucose, UA: NEGATIVE mg/dL
Hgb urine dipstick: NEGATIVE
Ketones, ur: 80 mg/dL — AB
Leukocytes,Ua: NEGATIVE
Nitrite: NEGATIVE
Protein, ur: 100 mg/dL — AB
Specific Gravity, Urine: 1.021 (ref 1.005–1.030)
pH: 5 (ref 5.0–8.0)

## 2021-10-13 LAB — LIPASE, BLOOD: Lipase: 18 U/L (ref 11–51)

## 2021-10-13 LAB — I-STAT BETA HCG BLOOD, ED (MC, WL, AP ONLY): I-stat hCG, quantitative: <5 m[IU]/mL (ref <5)

## 2021-10-13 MED ORDER — ONDANSETRON HCL 4 MG/2ML IJ SOLN
4.0000 mg | Freq: Once | INTRAMUSCULAR | Status: AC
  Administered 2021-10-13: 4 mg via INTRAVENOUS
  Filled 2021-10-13: qty 2

## 2021-10-13 MED ORDER — FENTANYL CITRATE PF 50 MCG/ML IJ SOSY
50.0000 ug | PREFILLED_SYRINGE | Freq: Once | INTRAMUSCULAR | Status: AC
  Administered 2021-10-13: 50 ug via INTRAVENOUS
  Filled 2021-10-13: qty 1

## 2021-10-13 MED ORDER — METOCLOPRAMIDE HCL 5 MG/ML IJ SOLN
5.0000 mg | Freq: Once | INTRAMUSCULAR | Status: AC
  Administered 2021-10-13: 5 mg via INTRAVENOUS
  Filled 2021-10-13: qty 2

## 2021-10-13 MED ORDER — LACTATED RINGERS IV BOLUS
1000.0000 mL | Freq: Once | INTRAVENOUS | Status: AC
  Administered 2021-10-13: 1000 mL via INTRAVENOUS

## 2021-10-13 MED ORDER — LACTATED RINGERS IV BOLUS
1000.0000 mL | Freq: Once | INTRAVENOUS | Status: AC
  Administered 2021-10-14: 1000 mL via INTRAVENOUS

## 2021-10-13 NOTE — ED Provider Notes (Signed)
Care assumed from Dr. Stevie Kern, patient with vomiting, altered mental status, tachycardia - several similar presentations in the past.  Labs do show mild acute kidney injury, metabolic acidosis with increased anion gap, leukocytosis, polycythemia, thrombocytosis.  She is pending urinalysis as well as CT of head and abdomen/pelvis, will likely need admission.  I have independently viewed and interpreted the urinalysis results, and my interpretation is ketonuria secondary to vomiting, proteinuria likely secondary to longstanding hypertension.  Patient continues to be tachycardic in spite of aggressive IV hydration.  She is also quite tremulous.  I am concerned about possible alcohol withdrawal, but patient denies alcohol use.  Also, with elevated anion gap acidosis of undetermined cause, I am concerned about possible occult salicylate poisoning.  Salicylate level has been ordered.  Case is discussed with Dr. Julian Reil of [facility], who agrees to admit the patient.  Results for orders placed or performed during the hospital encounter of 10/13/21  Lipase, blood  Result Value Ref Range   Lipase 18 11 - 51 U/L  Comprehensive metabolic panel  Result Value Ref Range   Sodium 145 135 - 145 mmol/L   Potassium 3.6 3.5 - 5.1 mmol/L   Chloride 103 98 - 111 mmol/L   CO2 19 (L) 22 - 32 mmol/L   Glucose, Bld 182 (H) 70 - 99 mg/dL   BUN 7 6 - 20 mg/dL   Creatinine, Ser 4.09 (H) 0.44 - 1.00 mg/dL   Calcium 9.8 8.9 - 81.1 mg/dL   Total Protein 7.9 6.5 - 8.1 g/dL   Albumin 3.7 3.5 - 5.0 g/dL   AST 33 15 - 41 U/L   ALT 27 0 - 44 U/L   Alkaline Phosphatase 85 38 - 126 U/L   Total Bilirubin 1.3 (H) 0.3 - 1.2 mg/dL   GFR, Estimated 54 (L) >60 mL/min   Anion gap 23 (H) 5 - 15  CBC  Result Value Ref Range   WBC 17.1 (H) 4.0 - 10.5 K/uL   RBC 5.12 (H) 3.87 - 5.11 MIL/uL   Hemoglobin 15.9 (H) 12.0 - 15.0 g/dL   HCT 91.4 (H) 78.2 - 95.6 %   MCV 92.8 80.0 - 100.0 fL   MCH 31.1 26.0 - 34.0 pg   MCHC 33.5  30.0 - 36.0 g/dL   RDW 21.3 08.6 - 57.8 %   Platelets 432 (H) 150 - 400 K/uL   nRBC 0.0 0.0 - 0.2 %  Urinalysis, Routine w reflex microscopic Urine, In & Out Cath  Result Value Ref Range   Color, Urine YELLOW YELLOW   APPearance CLEAR CLEAR   Specific Gravity, Urine 1.021 1.005 - 1.030   pH 5.0 5.0 - 8.0   Glucose, UA NEGATIVE NEGATIVE mg/dL   Hgb urine dipstick NEGATIVE NEGATIVE   Bilirubin Urine NEGATIVE NEGATIVE   Ketones, ur 80 (A) NEGATIVE mg/dL   Protein, ur 469 (A) NEGATIVE mg/dL   Nitrite NEGATIVE NEGATIVE   Leukocytes,Ua NEGATIVE NEGATIVE   RBC / HPF 0-5 0 - 5 RBC/hpf   WBC, UA 0-5 0 - 5 WBC/hpf   Bacteria, UA RARE (A) NONE SEEN   Squamous Epithelial / LPF 0-5 0 - 5   Mucus PRESENT    Hyaline Casts, UA PRESENT   I-Stat beta hCG blood, ED  Result Value Ref Range   I-stat hCG, quantitative <5.0 <5 mIU/mL   Comment 3           CT ABDOMEN PELVIS WO CONTRAST  Result Date: 10/14/2021 CLINICAL DATA:  Abdominal pain EXAM: CT ABDOMEN AND PELVIS WITHOUT CONTRAST TECHNIQUE: Multidetector CT imaging of the abdomen and pelvis was performed following the standard protocol without IV contrast. RADIATION DOSE REDUCTION: This exam was performed according to the departmental dose-optimization program which includes automated exposure control, adjustment of the mA and/or kV according to patient size and/or use of iterative reconstruction technique. COMPARISON:  10/03/2021 FINDINGS: Lower chest: No acute abnormality. Hepatobiliary: Fatty infiltration of the liver is noted. Gallbladder is within normal limits. Pancreas: Unremarkable. No pancreatic ductal dilatation or surrounding inflammatory changes. Spleen: Normal in size without focal abnormality. Adrenals/Urinary Tract: Adrenal glands are within normal limits. Kidneys are well visualize without renal calculi or obstructive changes. Ureters are within normal limits. Bladder is well distended. Stomach/Bowel: Appendix is within normal limits. No  obstructive or inflammatory changes of the colon are seen. Small bowel and stomach are within normal limits. Vascular/Lymphatic: Aortic atherosclerosis. No enlarged abdominal or pelvic lymph nodes. Reproductive: Uterus and bilateral adnexa are unremarkable. Other: No abdominal wall hernia or abnormality. No abdominopelvic ascites. Musculoskeletal: Old healed pubic rami fractures are noted on the right. No acute bony abnormality is noted. IMPRESSION: Fatty liver. No acute abnormality noted. Electronically Signed   By: Alcide Clever M.D.   On: 10/14/2021 00:18   CT Head Wo Contrast  Result Date: 10/14/2021 CLINICAL DATA:  Altered mental status EXAM: CT HEAD WITHOUT CONTRAST TECHNIQUE: Contiguous axial images were obtained from the base of the skull through the vertex without intravenous contrast. RADIATION DOSE REDUCTION: This exam was performed according to the departmental dose-optimization program which includes automated exposure control, adjustment of the mA and/or kV according to patient size and/or use of iterative reconstruction technique. COMPARISON:  06/08/2020 FINDINGS: Brain: No evidence of acute infarction, hemorrhage, hydrocephalus, extra-axial collection or mass lesion/mass effect. Vascular: No hyperdense vessel or unexpected calcification. Skull: Normal. Negative for fracture or focal lesion. Sinuses/Orbits: No acute finding. Other: None. IMPRESSION: No acute intracranial abnormality noted. Electronically Signed   By: Alcide Clever M.D.   On: 10/14/2021 00:15   CT ABDOMEN PELVIS W CONTRAST  Result Date: 10/03/2021 CLINICAL DATA:  51 year old female with abdominal pain, nausea vomiting. EXAM: CT ABDOMEN AND PELVIS WITH CONTRAST TECHNIQUE: Multidetector CT imaging of the abdomen and pelvis was performed using the standard protocol following bolus administration of intravenous contrast. RADIATION DOSE REDUCTION: This exam was performed according to the departmental dose-optimization program which  includes automated exposure control, adjustment of the mA and/or kV according to patient size and/or use of iterative reconstruction technique. CONTRAST:  OMNIPAQUE IOHEXOL 300 MG/ML  SOLN COMPARISON:  CT Abdomen and Pelvis 09/18/2021 and earlier. FINDINGS: Lower chest: Stable elevation of the right hemidiaphragm, otherwise negative. Hepatobiliary: Hepatic steatosis redemonstrated. Otherwise negative liver and gallbladder. Pancreas: Stable partial atrophy. Spleen: Negative. Adrenals/Urinary Tract: Stable, negative. Stomach/Bowel: Most of the large bowel is decompressed. Normal retrocecal appendix on series 3, image 61. No convincing large bowel inflammation. Decompressed terminal ileum and no dilated small bowel. Negative stomach. Decompressed duodenum. No free air, free fluid, or mesenteric inflammation identified. Vascular/Lymphatic: Minor aortic atherosclerosis. Major arterial structures in the abdomen and pelvis are patent. Portal venous system appears patent. No lymphadenopathy. Reproductive: Negative. Other: No pelvic free fluid. Musculoskeletal: Chronic lower thoracic and mid lumbar endplate Schmorl's nodes. Chronic bilateral pubic rami fractures. No acute osseous abnormality identified. IMPRESSION: 1. Stable CT abdomen and pelvis from last month. No acute or inflammatory process identified. Normal appendix. 2. Hepatic steatosis. Electronically Signed   By: Odessa Fleming  M.D.   On: 10/03/2021 06:51   CT ABDOMEN PELVIS W CONTRAST  Result Date: 09/18/2021 CLINICAL DATA:  Diffuse abdominal pain. EXAM: CT ABDOMEN AND PELVIS WITH CONTRAST TECHNIQUE: Multidetector CT imaging of the abdomen and pelvis was performed using the standard protocol following bolus administration of intravenous contrast. RADIATION DOSE REDUCTION: This exam was performed according to the departmental dose-optimization program which includes automated exposure control, adjustment of the mA and/or kV according to patient size and/or use  of iterative reconstruction technique. CONTRAST:  OMNIPAQUE IOHEXOL 300 MG/ML  SOLN COMPARISON:  08/27/2021. FINDINGS: Lower chest: Clear lung bases. Hepatobiliary: Decreased liver attenuation consistent with fatty infiltration. Normal liver size. No mass or focal lesion. Normal gallbladder. No bile duct dilation. Pancreas: Unremarkable. No pancreatic ductal dilatation or surrounding inflammatory changes. Spleen: Normal in size without focal abnormality. Adrenals/Urinary Tract: Adrenal glands are unremarkable. Kidneys are normal, without renal calculi, focal lesion, or hydronephrosis. Bladder is unremarkable. Stomach/Bowel: Stomach is within normal limits. Appendix appears normal. No evidence of bowel wall thickening, distention, or inflammatory changes. Vascular/Lymphatic: Minimal aortic atherosclerosis. No aneurysm. Vasculature otherwise unremarkable. No enlarged lymph nodes. Reproductive: Uterus and bilateral adnexa are unremarkable. Other: No abdominal wall hernia or abnormality. No abdominopelvic ascites. Musculoskeletal: No fracture or acute finding.  No bone lesion. IMPRESSION: 1. No acute findings within the abdomen or pelvis. Stable appearance from the prior abdomen and pelvis CT. 2. Hepatic steatosis. 3. Minimal aortic atherosclerosis. Electronically Signed   By: Amie Portland M.D.   On: 09/18/2021 10:25   DG Chest Portable 1 View  Result Date: 09/18/2021 CLINICAL DATA:  Chest pain EXAM: PORTABLE CHEST 1 VIEW COMPARISON:  Radiograph 06/11/2020 FINDINGS: The cardiomediastinal silhouette is within normal limits. There is no focal airspace consolidation. There is no pleural effusion. No pneumothorax. There is no acute osseous abnormality. IMPRESSION: No evidence of acute cardiopulmonary disease. Electronically Signed   By: Caprice Renshaw M.D.   On: 09/18/2021 09:11       Dione Booze, MD 10/14/21 [id number]

## 2021-10-13 NOTE — ED Notes (Signed)
I'm unable to triage pt, because she will not answer my questions.

## 2021-10-14 DIAGNOSIS — R112 Nausea with vomiting, unspecified: Secondary | ICD-10-CM

## 2021-10-14 DIAGNOSIS — M1990 Unspecified osteoarthritis, unspecified site: Secondary | ICD-10-CM | POA: Diagnosis present

## 2021-10-14 DIAGNOSIS — E8729 Other acidosis: Secondary | ICD-10-CM | POA: Diagnosis present

## 2021-10-14 DIAGNOSIS — E860 Dehydration: Secondary | ICD-10-CM | POA: Diagnosis present

## 2021-10-14 DIAGNOSIS — G929 Unspecified toxic encephalopathy: Secondary | ICD-10-CM | POA: Diagnosis present

## 2021-10-14 DIAGNOSIS — Z765 Malingerer [conscious simulation]: Secondary | ICD-10-CM | POA: Diagnosis not present

## 2021-10-14 DIAGNOSIS — M5030 Other cervical disc degeneration, unspecified cervical region: Secondary | ICD-10-CM | POA: Diagnosis present

## 2021-10-14 DIAGNOSIS — Z803 Family history of malignant neoplasm of breast: Secondary | ICD-10-CM | POA: Diagnosis not present

## 2021-10-14 DIAGNOSIS — Z7984 Long term (current) use of oral hypoglycemic drugs: Secondary | ICD-10-CM | POA: Diagnosis not present

## 2021-10-14 DIAGNOSIS — F1910 Other psychoactive substance abuse, uncomplicated: Secondary | ICD-10-CM

## 2021-10-14 DIAGNOSIS — F4310 Post-traumatic stress disorder, unspecified: Secondary | ICD-10-CM | POA: Diagnosis present

## 2021-10-14 DIAGNOSIS — G9340 Encephalopathy, unspecified: Secondary | ICD-10-CM | POA: Diagnosis present

## 2021-10-14 DIAGNOSIS — F319 Bipolar disorder, unspecified: Secondary | ICD-10-CM | POA: Diagnosis present

## 2021-10-14 DIAGNOSIS — E785 Hyperlipidemia, unspecified: Secondary | ICD-10-CM | POA: Diagnosis present

## 2021-10-14 DIAGNOSIS — Z8674 Personal history of sudden cardiac arrest: Secondary | ICD-10-CM | POA: Diagnosis not present

## 2021-10-14 DIAGNOSIS — Z79899 Other long term (current) drug therapy: Secondary | ICD-10-CM | POA: Diagnosis not present

## 2021-10-14 DIAGNOSIS — I10 Essential (primary) hypertension: Secondary | ICD-10-CM | POA: Diagnosis present

## 2021-10-14 DIAGNOSIS — Z79891 Long term (current) use of opiate analgesic: Secondary | ICD-10-CM | POA: Diagnosis not present

## 2021-10-14 DIAGNOSIS — E1100 Type 2 diabetes mellitus with hyperosmolarity without nonketotic hyperglycemic-hyperosmolar coma (NKHHC): Secondary | ICD-10-CM

## 2021-10-14 DIAGNOSIS — R4182 Altered mental status, unspecified: Secondary | ICD-10-CM | POA: Diagnosis present

## 2021-10-14 DIAGNOSIS — F19129 Other psychoactive substance abuse with intoxication, unspecified: Secondary | ICD-10-CM | POA: Diagnosis present

## 2021-10-14 DIAGNOSIS — Z79890 Hormone replacement therapy (postmenopausal): Secondary | ICD-10-CM | POA: Diagnosis not present

## 2021-10-14 DIAGNOSIS — G894 Chronic pain syndrome: Secondary | ICD-10-CM | POA: Diagnosis present

## 2021-10-14 DIAGNOSIS — Z8371 Family history of colonic polyps: Secondary | ICD-10-CM | POA: Diagnosis not present

## 2021-10-14 DIAGNOSIS — Z833 Family history of diabetes mellitus: Secondary | ICD-10-CM | POA: Diagnosis not present

## 2021-10-14 DIAGNOSIS — E119 Type 2 diabetes mellitus without complications: Secondary | ICD-10-CM | POA: Diagnosis present

## 2021-10-14 DIAGNOSIS — Z85828 Personal history of other malignant neoplasm of skin: Secondary | ICD-10-CM | POA: Diagnosis not present

## 2021-10-14 DIAGNOSIS — T43625A Adverse effect of amphetamines, initial encounter: Secondary | ICD-10-CM | POA: Diagnosis present

## 2021-10-14 DIAGNOSIS — E872 Acidosis, unspecified: Secondary | ICD-10-CM | POA: Diagnosis present

## 2021-10-14 LAB — CBG MONITORING, ED: Glucose-Capillary: 115 mg/dL — ABNORMAL HIGH (ref 70–99)

## 2021-10-14 LAB — GLUCOSE, CAPILLARY
Glucose-Capillary: 104 mg/dL — ABNORMAL HIGH (ref 70–99)
Glucose-Capillary: 88 mg/dL (ref 70–99)
Glucose-Capillary: 96 mg/dL (ref 70–99)
Glucose-Capillary: 84 mg/dL (ref 70–99)

## 2021-10-14 LAB — BETA-HYDROXYBUTYRIC ACID: Beta-Hydroxybutyric Acid: 3.67 mmol/L — ABNORMAL HIGH (ref 0.05–0.27)

## 2021-10-14 LAB — ETHANOL: Alcohol, Ethyl (B): <10 mg/dL (ref <10)

## 2021-10-14 LAB — LACTIC ACID, PLASMA
Lactic Acid, Venous: 1.4 mmol/L (ref 0.5–1.9)
Lactic Acid, Venous: 1.7 mmol/L (ref 0.5–1.9)

## 2021-10-14 LAB — RAPID URINE DRUG SCREEN, HOSP PERFORMED
Amphetamines: POSITIVE — AB
Barbiturates: NOT DETECTED
Benzodiazepines: NOT DETECTED
Cocaine: NOT DETECTED
Opiates: POSITIVE — AB
Tetrahydrocannabinol: NOT DETECTED

## 2021-10-14 LAB — SALICYLATE LEVEL: Salicylate Lvl: <7 mg/dL — ABNORMAL LOW (ref 7.0–30.0)

## 2021-10-14 LAB — ACETAMINOPHEN LEVEL: Acetaminophen (Tylenol), Serum: <10 ug/mL — ABNORMAL LOW (ref 10–30)

## 2021-10-14 MED ORDER — INSULIN ASPART 100 UNIT/ML IJ SOLN
0.0000 [IU] | Freq: Three times a day (TID) | INTRAMUSCULAR | Status: DC
  Administered 2021-10-15: 2 [IU] via SUBCUTANEOUS

## 2021-10-14 MED ORDER — FENTANYL CITRATE PF 50 MCG/ML IJ SOSY
50.0000 ug | PREFILLED_SYRINGE | Freq: Once | INTRAMUSCULAR | Status: AC
  Administered 2021-10-14: 50 ug via INTRAVENOUS
  Filled 2021-10-14: qty 1

## 2021-10-14 MED ORDER — PRAZOSIN HCL 2 MG PO CAPS
2.0000 mg | ORAL_CAPSULE | Freq: Every day | ORAL | Status: DC
  Administered 2021-10-14: 2 mg via ORAL
  Filled 2021-10-14 (×2): qty 1

## 2021-10-14 MED ORDER — TRAZODONE HCL 50 MG PO TABS: 150.0000 mg | ORAL_TABLET | Freq: Every day | ORAL | Status: DC

## 2021-10-14 MED ORDER — ONDANSETRON HCL 4 MG/2ML IJ SOLN
4.0000 mg | Freq: Four times a day (QID) | INTRAMUSCULAR | Status: DC | PRN
  Administered 2021-10-14 (×2): 4 mg via INTRAVENOUS
  Filled 2021-10-14 (×2): qty 2

## 2021-10-14 MED ORDER — LORAZEPAM 2 MG/ML IJ SOLN
1.0000 mg | Freq: Once | INTRAMUSCULAR | Status: AC
  Administered 2021-10-14: 1 mg via INTRAVENOUS
  Filled 2021-10-14: qty 1

## 2021-10-14 MED ORDER — LACTATED RINGERS IV BOLUS
1000.0000 mL | Freq: Once | INTRAVENOUS | Status: DC
Start: 2021-10-14 — End: 2021-10-14

## 2021-10-14 MED ORDER — RENA-VITE PO TABS
1.0000 | ORAL_TABLET | Freq: Every day | ORAL | Status: DC
  Administered 2021-10-14: 1 via ORAL
  Filled 2021-10-14: qty 1

## 2021-10-14 MED ORDER — ONDANSETRON HCL 4 MG PO TABS
4.0000 mg | ORAL_TABLET | Freq: Four times a day (QID) | ORAL | Status: DC | PRN
  Administered 2021-10-14: 4 mg via ORAL
  Filled 2021-10-14: qty 1

## 2021-10-14 MED ORDER — LACTATED RINGERS IV SOLN: INTRAVENOUS | Status: DC

## 2021-10-14 MED ORDER — ATORVASTATIN CALCIUM 10 MG PO TABS
10.0000 mg | ORAL_TABLET | Freq: Every day | ORAL | Status: DC
  Administered 2021-10-14 – 2021-10-15 (×2): 10 mg via ORAL
  Filled 2021-10-14 (×2): qty 1

## 2021-10-14 MED ORDER — PANTOPRAZOLE SODIUM 40 MG PO TBEC
40.0000 mg | DELAYED_RELEASE_TABLET | Freq: Every day | ORAL | Status: DC
  Administered 2021-10-14 – 2021-10-15 (×2): 40 mg via ORAL
  Filled 2021-10-14 (×2): qty 1

## 2021-10-14 MED ORDER — LEVOTHYROXINE SODIUM 50 MCG PO TABS
50.0000 ug | ORAL_TABLET | Freq: Every day | ORAL | Status: DC
Start: 2021-10-14 — End: 2021-10-15
  Administered 2021-10-14 – 2021-10-15 (×2): 50 ug via ORAL
  Filled 2021-10-14 (×2): qty 1

## 2021-10-14 MED ORDER — CARVEDILOL 12.5 MG PO TABS
12.5000 mg | ORAL_TABLET | Freq: Two times a day (BID) | ORAL | Status: DC
  Administered 2021-10-14 – 2021-10-15 (×3): 12.5 mg via ORAL
  Filled 2021-10-14 (×3): qty 1

## 2021-10-14 MED ORDER — LORAZEPAM 2 MG/ML IJ SOLN
0.5000 mg | INTRAMUSCULAR | Status: DC | PRN
  Administered 2021-10-14 (×3): 1 mg via INTRAVENOUS
  Filled 2021-10-14 (×3): qty 1

## 2021-10-14 MED ORDER — ENOXAPARIN SODIUM 40 MG/0.4ML IJ SOSY
40.0000 mg | PREFILLED_SYRINGE | INTRAMUSCULAR | Status: DC
Start: 2021-10-14 — End: 2021-10-15
  Administered 2021-10-14 – 2021-10-15 (×2): 40 mg via SUBCUTANEOUS
  Filled 2021-10-14 (×2): qty 0.4

## 2021-10-14 MED ORDER — LISINOPRIL 10 MG PO TABS
10.0000 mg | ORAL_TABLET | Freq: Every day | ORAL | Status: DC
  Administered 2021-10-14 – 2021-10-15 (×2): 10 mg via ORAL
  Filled 2021-10-14 (×2): qty 1

## 2021-10-14 MED ORDER — INSULIN ASPART 100 UNIT/ML IJ SOLN: 0.0000 [IU] | INTRAMUSCULAR | Status: DC

## 2021-10-14 MED ORDER — OXYCODONE-ACETAMINOPHEN 5-325 MG PO TABS
1.0000 | ORAL_TABLET | Freq: Four times a day (QID) | ORAL | Status: DC | PRN
  Administered 2021-10-14 – 2021-10-15 (×5): 1 via ORAL
  Filled 2021-10-14 (×5): qty 1

## 2021-10-14 MED ORDER — OXYCODONE HCL 5 MG PO TABS
2.5000 mg | ORAL_TABLET | Freq: Four times a day (QID) | ORAL | Status: DC | PRN
  Administered 2021-10-14 (×3): 2.5 mg via ORAL
  Filled 2021-10-14 (×3): qty 1

## 2021-10-14 MED ORDER — AMLODIPINE BESYLATE 10 MG PO TABS
10.0000 mg | ORAL_TABLET | Freq: Every day | ORAL | Status: DC
  Administered 2021-10-14 – 2021-10-15 (×2): 10 mg via ORAL
  Filled 2021-10-14 (×2): qty 1

## 2021-10-14 MED ORDER — FLUOXETINE HCL 20 MG PO CAPS: 40.0000 mg | ORAL_CAPSULE | Freq: Every day | ORAL | Status: DC

## 2021-10-14 MED ORDER — OXYCODONE-ACETAMINOPHEN 7.5-325 MG PO TABS: 1.0000 | ORAL_TABLET | Freq: Four times a day (QID) | ORAL | Status: DC | PRN

## 2021-10-14 MED ORDER — ARIPIPRAZOLE 10 MG PO TABS: 5.0000 mg | ORAL_TABLET | Freq: Every day | ORAL | Status: DC

## 2021-10-14 NOTE — Assessment & Plan Note (Signed)
Pt hyper alert, was agitated earlier.  One word / short phrase answers. S. Tach to the 120s. Suspicious for amphetamine OD / toxicity given that her UDS is once again positive for this today (these are not prescribed and no records of filling these in past 2 years on [employer]). EtOH withdrawal considered, though pt has no h/o EtOH abuse, denies use to me and has neg EtOH level on labs today (negative on all labs in past too). 1. Ativan PRN agitation 2. Tele monitor

## 2021-10-14 NOTE — Assessment & Plan Note (Addendum)
Hold abilify, prozac, and trazodone in-case her AMS is actually some form of NMS / serotonin syndrome.  At least until mental status improves.

## 2021-10-14 NOTE — Assessment & Plan Note (Addendum)
In addition to using prescribed opiates for chronic pain, patient has MULTIPLE positive UDS exams (most recently April and Feb this year) for amphetamines which she is NOT prescribed nor can I see any prescriptions for within the last 2 years on [employer].  Additionally, the patient has already had a cardiac arrest in Feb 2022 that was felt to be likely due to accidental opiate overdose!  And UDS is again positive today for amphetamines.  1. Absolutely no IV opiates. 2. Cont percocet 7.5 for now, but Id strongly recommend weaning her off of this med too!

## 2021-10-14 NOTE — ED Notes (Signed)
Pt had episode of emesis. Pt continues to be tachycardic and c/o of generalized abd pain. AO to self only.

## 2021-10-14 NOTE — Assessment & Plan Note (Signed)
Cont home BP meds ?

## 2021-10-15 DIAGNOSIS — G9340 Encephalopathy, unspecified: Secondary | ICD-10-CM | POA: Diagnosis not present

## 2021-10-15 LAB — GLUCOSE, CAPILLARY
Glucose-Capillary: 105 mg/dL — ABNORMAL HIGH (ref 70–99)
Glucose-Capillary: 111 mg/dL — ABNORMAL HIGH (ref 70–99)
Glucose-Capillary: 154 mg/dL — ABNORMAL HIGH (ref 70–99)
Glucose-Capillary: 96 mg/dL (ref 70–99)
Glucose-Capillary: 97 mg/dL (ref 70–99)

## 2021-10-15 LAB — BASIC METABOLIC PANEL
Anion gap: 10 (ref 5–15)
BUN: <5 mg/dL — ABNORMAL LOW (ref 6–20)
CO2: 25 mmol/L (ref 22–32)
Calcium: 8.4 mg/dL — ABNORMAL LOW (ref 8.9–10.3)
Chloride: 107 mmol/L (ref 98–111)
Creatinine, Ser: 0.75 mg/dL (ref 0.44–1.00)
GFR, Estimated: >60 mL/min (ref >60)
Glucose, Bld: 98 mg/dL (ref 70–99)
Potassium: 3.2 mmol/L — ABNORMAL LOW (ref 3.5–5.1)
Sodium: 142 mmol/L (ref 135–145)

## 2021-10-15 LAB — CBC
HCT: 37 % (ref 36.0–46.0)
Hemoglobin: 12.1 g/dL (ref 12.0–15.0)
MCH: 30.8 pg (ref 26.0–34.0)
MCHC: 32.7 g/dL (ref 30.0–36.0)
MCV: 94.1 fL (ref 80.0–100.0)
Platelets: 233 10*3/uL (ref 150–400)
RBC: 3.93 MIL/uL (ref 3.87–5.11)
RDW: 14.6 % (ref 11.5–15.5)
WBC: 7.9 10*3/uL (ref 4.0–10.5)
nRBC: 0 % (ref 0.0–0.2)

## 2021-10-15 LAB — MAGNESIUM: Magnesium: 1.7 mg/dL (ref 1.7–2.4)

## 2021-10-15 MED ORDER — POTASSIUM CHLORIDE CRYS ER 20 MEQ PO TBCR
40.0000 meq | EXTENDED_RELEASE_TABLET | ORAL | Status: AC
  Administered 2021-10-15 (×2): 40 meq via ORAL
  Filled 2021-10-15 (×2): qty 2

## 2021-10-15 NOTE — Discharge Summary (Signed)
Physician Discharge Summary   Patient: [name] MRN: [id number] DOB: 1970/08/03  Admit date:     10/13/2021  Discharge date: 10/15/2021  Discharge Physician: Jacquelin Hawking, MD   PCP: Lance Bosch, NP   Recommendations at discharge:  Continued medication education to avoid repeat polypharmacy  Discharge Diagnoses: Principal Problem:   Acute encephalopathy Active Problems:   Intractable nausea and vomiting   Substance abuse (HCC)   High anion gap metabolic acidosis   Leukocytosis   T2DM (type 2 diabetes mellitus) (HCC)   Bipolar affective disorder (HCC)   Essential hypertension  Resolved Problems:   * No resolved hospital problems. *  Hospital Course: [name] is a 51 y.o. female with a history of chronic pain syndrome with history of pain seeking behavior, hypertension, cardiac arrest, accidental drug overdose, bipolar disorder, diabetes mellitus type 2. Patient presented secondary to nausea and found to have significant lethargy followed by agitation and hyperarousal. UDS positive for amphetamines and opiates. Patient improved with supportive care. Patient denies use of illicit drug use or use of drugs not prescribed to her. Discharged home.  Assessment and Plan:  Acute toxic encephalopathy Presumed secondary to drug intoxication. Resolved with supportive care   Intractable nausea/vomiting Patient reports issues with emesis. None witnessed. IV fluids given. Resolved. Patient tolerated diet prior to discharge.   Abdominal tenderness Uncertain etiology. Possibly secondary to recent vomiting.   High anion gap metabolic acidosis Mild. Continue IV fluids.   Chronic pain Continue Percocet   Diabetes mellitus, type 2 Continue home metformin.  Leukocytosis Unsure of etiology. Likely reactive vs hemoconcentration. Improved with all hematology cell lines decreasing. Resolved.  Primary hypertension Continue home amlodipine and lisinopril.  Bipolar affective  disorder Continue home Prozac and Abilify      Consultants: None Procedures performed: None  Disposition: Home Diet recommendation:  Carb modified diet DISCHARGE MEDICATION: Allergies as of 10/15/2021   No Known Allergies      Medication List     TAKE these medications    Accu-Chek Guide w/Device Kit Use as directed to check blood sugar up to 4 times daily.   acetaminophen 325 MG tablet Commonly known as: TYLENOL Take 2 tablets (650 mg total) by mouth every 6 (six) hours as needed for mild pain, fever or headache. What changed: when to take this   amLODipine 10 MG tablet Commonly known as: NORVASC Take 1 tablet (10 mg total) by mouth daily.   ARIPiprazole 5 MG tablet Commonly known as: ABILIFY Take 1 tablet (5 mg total) by mouth daily.   atorvastatin 10 MG tablet Commonly known as: LIPITOR Take 10 mg by mouth daily.   BENADRYL PO Take 75 mg by mouth at bedtime.   blood glucose meter kit and supplies Kit Dispense based on patient and insurance preference. Use up to four times daily as directed.   carvedilol 12.5 MG tablet Commonly known as: COREG Take 1 tablet (12.5 mg total) by mouth 2 (two) times daily with a meal.   DIARRHEA PO Take 2-3 tablets by mouth See admin instructions. Take 2-3 tablets by mouth as needed for diarrhea. Unknown frequency   FLUoxetine 40 MG capsule Commonly known as: PROZAC Take 1 capsule (40 mg total) by mouth daily.   levothyroxine 50 MCG tablet Commonly known as: SYNTHROID Take 50 mcg by mouth daily.   lisinopril 10 MG tablet Commonly known as: ZESTRIL Take 1 tablet (10 mg total) by mouth daily.   metFORMIN 500 MG tablet Commonly known as:  GLUCOPHAGE Take 500 mg by mouth 2 (two) times daily with a meal.   multivitamin Tabs tablet Take 1 tablet by mouth at bedtime.   ondansetron 4 MG disintegrating tablet Commonly known as: ZOFRAN-ODT Take 1 tablet (4 mg total) by mouth every 8 (eight) hours as needed for nausea or  vomiting. What changed: when to take this   oxyCODONE-acetaminophen 7.5-325 MG tablet Commonly known as: PERCOCET Take 1 tablet by mouth See admin instructions. Take 7.5 - 325 mg (1-2 tab) 5- times daily   pantoprazole 40 MG tablet Commonly known as: Protonix Take 1 tablet (40 mg total) by mouth daily.   prazosin 2 MG capsule Commonly known as: MINIPRESS Take 1 capsule (2 mg total) by mouth at bedtime.   promethazine 25 MG suppository Commonly known as: PHENERGAN Place 1 suppository (25 mg total) rectally every 6 (six) hours as needed for refractory nausea / vomiting.   tiZANidine 4 MG tablet Commonly known as: ZANAFLEX Take 3 tablets (12 mg total) by mouth every 8 (eight) hours as needed for muscle spasms.   traZODone 50 MG tablet Commonly known as: DESYREL Take 3 tablets (150 mg total) by mouth at bedtime.        Follow-up Information     Lance Bosch, NP. Schedule an appointment as soon as possible for a visit in 1 week(s).   Specialty: Nurse Practitioner Why: For hospital follow-up Contact information: [address] [place] [address] [phone]                Discharge Exam: BP 129/68 (BP Location: Right Arm)   Pulse 95   Temp 98.1 F (36.7 C) (Oral)   Resp 20   Ht 5\' 4"  (1.626 m)   Wt 94.9 kg   LMP 06/03/2018 (Approximate) Comment: neg hcg 05/10/20  SpO2 98%   BMI 35.91 kg/m   General exam: Appears calm and comfortable Respiratory system: Clear to auscultation. Respiratory effort normal. Cardiovascular system: S1 & S2 heard, RRR. No murmurs, rubs, gallops or clicks. Gastrointestinal system: Abdomen is nondistended, soft and nontender. No organomegaly or masses felt. Normal bowel sounds heard. Central nervous system: Alert and oriented. No focal neurological deficits. Musculoskeletal: No edema. No calf tenderness Skin: No cyanosis. No rashes Psychiatry: Judgement and insight appear normal. Mood & affect appropriate.   Condition at  discharge: stable  The results of significant diagnostics from this hospitalization (including imaging, microbiology, ancillary and laboratory) are listed below for reference.   Imaging Studies: CT ABDOMEN PELVIS WO CONTRAST  Result Date: 10/14/2021 CLINICAL DATA:  Abdominal pain EXAM: CT ABDOMEN AND PELVIS WITHOUT CONTRAST TECHNIQUE: Multidetector CT imaging of the abdomen and pelvis was performed following the standard protocol without IV contrast. RADIATION DOSE REDUCTION: This exam was performed according to the departmental dose-optimization program which includes automated exposure control, adjustment of the mA and/or kV according to patient size and/or use of iterative reconstruction technique. COMPARISON:  10/03/2021 FINDINGS: Lower chest: No acute abnormality. Hepatobiliary: Fatty infiltration of the liver is noted. Gallbladder is within normal limits. Pancreas: Unremarkable. No pancreatic ductal dilatation or surrounding inflammatory changes. Spleen: Normal in size without focal abnormality. Adrenals/Urinary Tract: Adrenal glands are within normal limits. Kidneys are well visualize without renal calculi or obstructive changes. Ureters are within normal limits. Bladder is well distended. Stomach/Bowel: Appendix is within normal limits. No obstructive or inflammatory changes of the colon are seen. Small bowel and stomach are within normal limits. Vascular/Lymphatic: Aortic atherosclerosis. No enlarged abdominal or pelvic lymph nodes.  Reproductive: Uterus and bilateral adnexa are unremarkable. Other: No abdominal wall hernia or abnormality. No abdominopelvic ascites. Musculoskeletal: Old healed pubic rami fractures are noted on the right. No acute bony abnormality is noted. IMPRESSION: Fatty liver. No acute abnormality noted. Electronically Signed   By: Alcide Clever M.D.   On: 10/14/2021 00:18   CT Head Wo Contrast  Result Date: 10/14/2021 CLINICAL DATA:  Altered mental status EXAM: CT HEAD WITHOUT  CONTRAST TECHNIQUE: Contiguous axial images were obtained from the base of the skull through the vertex without intravenous contrast. RADIATION DOSE REDUCTION: This exam was performed according to the departmental dose-optimization program which includes automated exposure control, adjustment of the mA and/or kV according to patient size and/or use of iterative reconstruction technique. COMPARISON:  06/08/2020 FINDINGS: Brain: No evidence of acute infarction, hemorrhage, hydrocephalus, extra-axial collection or mass lesion/mass effect. Vascular: No hyperdense vessel or unexpected calcification. Skull: Normal. Negative for fracture or focal lesion. Sinuses/Orbits: No acute finding. Other: None. IMPRESSION: No acute intracranial abnormality noted. Electronically Signed   By: Alcide Clever M.D.   On: 10/14/2021 00:15   CT ABDOMEN PELVIS W CONTRAST  Result Date: 10/03/2021 CLINICAL DATA:  51 year old female with abdominal pain, nausea vomiting. EXAM: CT ABDOMEN AND PELVIS WITH CONTRAST TECHNIQUE: Multidetector CT imaging of the abdomen and pelvis was performed using the standard protocol following bolus administration of intravenous contrast. RADIATION DOSE REDUCTION: This exam was performed according to the departmental dose-optimization program which includes automated exposure control, adjustment of the mA and/or kV according to patient size and/or use of iterative reconstruction technique. CONTRAST:  OMNIPAQUE IOHEXOL 300 MG/ML  SOLN COMPARISON:  CT Abdomen and Pelvis 09/18/2021 and earlier. FINDINGS: Lower chest: Stable elevation of the right hemidiaphragm, otherwise negative. Hepatobiliary: Hepatic steatosis redemonstrated. Otherwise negative liver and gallbladder. Pancreas: Stable partial atrophy. Spleen: Negative. Adrenals/Urinary Tract: Stable, negative. Stomach/Bowel: Most of the large bowel is decompressed. Normal retrocecal appendix on series 3, image 61. No convincing large bowel inflammation.  Decompressed terminal ileum and no dilated small bowel. Negative stomach. Decompressed duodenum. No free air, free fluid, or mesenteric inflammation identified. Vascular/Lymphatic: Minor aortic atherosclerosis. Major arterial structures in the abdomen and pelvis are patent. Portal venous system appears patent. No lymphadenopathy. Reproductive: Negative. Other: No pelvic free fluid. Musculoskeletal: Chronic lower thoracic and mid lumbar endplate Schmorl's nodes. Chronic bilateral pubic rami fractures. No acute osseous abnormality identified. IMPRESSION: 1. Stable CT abdomen and pelvis from last month. No acute or inflammatory process identified. Normal appendix. 2. Hepatic steatosis. Electronically Signed   By: Odessa Fleming M.D.   On: 10/03/2021 06:51   CT ABDOMEN PELVIS W CONTRAST  Result Date: 09/18/2021 CLINICAL DATA:  Diffuse abdominal pain. EXAM: CT ABDOMEN AND PELVIS WITH CONTRAST TECHNIQUE: Multidetector CT imaging of the abdomen and pelvis was performed using the standard protocol following bolus administration of intravenous contrast. RADIATION DOSE REDUCTION: This exam was performed according to the departmental dose-optimization program which includes automated exposure control, adjustment of the mA and/or kV according to patient size and/or use of iterative reconstruction technique. CONTRAST:  OMNIPAQUE IOHEXOL 300 MG/ML  SOLN COMPARISON:  08/27/2021. FINDINGS: Lower chest: Clear lung bases. Hepatobiliary: Decreased liver attenuation consistent with fatty infiltration. Normal liver size. No mass or focal lesion. Normal gallbladder. No bile duct dilation. Pancreas: Unremarkable. No pancreatic ductal dilatation or surrounding inflammatory changes. Spleen: Normal in size without focal abnormality. Adrenals/Urinary Tract: Adrenal glands are unremarkable. Kidneys are normal, without renal calculi, focal lesion, or hydronephrosis. Bladder is unremarkable.  Stomach/Bowel: Stomach is within normal limits.  Appendix appears normal. No evidence of bowel wall thickening, distention, or inflammatory changes. Vascular/Lymphatic: Minimal aortic atherosclerosis. No aneurysm. Vasculature otherwise unremarkable. No enlarged lymph nodes. Reproductive: Uterus and bilateral adnexa are unremarkable. Other: No abdominal wall hernia or abnormality. No abdominopelvic ascites. Musculoskeletal: No fracture or acute finding.  No bone lesion. IMPRESSION: 1. No acute findings within the abdomen or pelvis. Stable appearance from the prior abdomen and pelvis CT. 2. Hepatic steatosis. 3. Minimal aortic atherosclerosis. Electronically Signed   By: Amie Portland M.D.   On: 09/18/2021 10:25   DG Chest Portable 1 View  Result Date: 09/18/2021 CLINICAL DATA:  Chest pain EXAM: PORTABLE CHEST 1 VIEW COMPARISON:  Radiograph 06/11/2020 FINDINGS: The cardiomediastinal silhouette is within normal limits. There is no focal airspace consolidation. There is no pleural effusion. No pneumothorax. There is no acute osseous abnormality. IMPRESSION: No evidence of acute cardiopulmonary disease. Electronically Signed   By: Caprice Renshaw M.D.   On: 09/18/2021 09:11    Microbiology: Results for orders placed or performed during the hospital encounter of 08/26/21  Resp Panel by RT-PCR (Flu A&B, Covid) Nasopharyngeal Swab     Status: None   Collection Time: 08/26/21  8:54 PM   Specimen: Nasopharyngeal Swab; Nasopharyngeal(NP) swabs in vial transport medium  Result Value Ref Range Status   SARS Coronavirus 2 by RT PCR NEGATIVE NEGATIVE Final    Comment: (NOTE) SARS-CoV-2 target nucleic acids are NOT DETECTED.  The SARS-CoV-2 RNA is generally detectable in upper respiratory specimens during the acute phase of infection. The lowest concentration of SARS-CoV-2 viral copies this assay can detect is 138 copies/mL. A negative result does not preclude SARS-Cov-2 infection and should not be used as the sole basis for treatment or other patient management  decisions. A negative result may occur with  improper specimen collection/handling, submission of specimen other than nasopharyngeal swab, presence of viral mutation(s) within the areas targeted by this assay, and inadequate number of viral copies(<138 copies/mL). A negative result must be combined with clinical observations, patient history, and epidemiological information. The expected result is Negative.  Fact Sheet for Patients:  BloggerCourse.com  Fact Sheet for Healthcare Providers:  SeriousBroker.it  This test is no t yet approved or cleared by the [place] FDA and  has been authorized for detection and/or diagnosis of SARS-CoV-2 by FDA under an Emergency Use Authorization (EUA). This EUA will remain  in effect (meaning this test can be used) for the duration of the COVID-19 declaration under Section 564(b)(1) of the Act, 21 U.S.C.section 360bbb-3(b)(1), unless the authorization is terminated  or revoked sooner.       Influenza A by PCR NEGATIVE NEGATIVE Final   Influenza B by PCR NEGATIVE NEGATIVE Final    Comment: (NOTE) The Xpert Xpress SARS-CoV-2/FLU/RSV plus assay is intended as an aid in the diagnosis of influenza from Nasopharyngeal swab specimens and should not be used as a sole basis for treatment. Nasal washings and aspirates are unacceptable for Xpert Xpress SARS-CoV-2/FLU/RSV testing.  Fact Sheet for Patients: BloggerCourse.com  Fact Sheet for Healthcare Providers: SeriousBroker.it  This test is not yet approved or cleared by the [place] FDA and has been authorized for detection and/or diagnosis of SARS-CoV-2 by FDA under an Emergency Use Authorization (EUA). This EUA will remain in effect (meaning this test can be used) for the duration of the COVID-19 declaration under Section 564(b)(1) of the Act, 21 U.S.C. section 360bbb-3(b)(1), unless the  authorization is terminated or  revoked.  Performed at [facility], [address], [place], [place] [address]   MRSA Next Gen by PCR, Nasal     Status: None   Collection Time: 08/27/21  6:47 AM   Specimen: Nasal Mucosa; Nasal Swab  Result Value Ref Range Status   MRSA by PCR Next Gen NOT DETECTED NOT DETECTED Final    Comment: (NOTE) The GeneXpert MRSA Assay (FDA approved for NASAL specimens only), is one component of a comprehensive MRSA colonization surveillance program. It is not intended to diagnose MRSA infection nor to guide or monitor treatment for MRSA infections. Test performance is not FDA approved in patients less than 79 years old. Performed at [facility], [address], [place], [place] [address]     Labs: CBC: Recent Labs  Lab 10/13/21 2116 10/15/21 0053  WBC 17.1* 7.9  HGB 15.9* 12.1  HCT 47.5* 37.0  MCV 92.8 94.1  PLT 432* 233   Basic Metabolic Panel: Recent Labs  Lab 10/13/21 2116 10/15/21 0053  NA 145 142  K 3.6 3.2*  CL 103 107  CO2 19* 25  GLUCOSE 182* 98  BUN 7 <5*  CREATININE 1.22* 0.75  CALCIUM 9.8 8.4*  MG  --  1.7   Liver Function Tests: Recent Labs  Lab 10/13/21 2116  AST 33  ALT 27  ALKPHOS 85  BILITOT 1.3*  PROT 7.9  ALBUMIN 3.7   CBG: Recent Labs  Lab 10/15/21 0015 10/15/21 0502 10/15/21 0602 10/15/21 0812 10/15/21 1220  GLUCAP 97 105* 96 111* 154*    Discharge time spent: 35 minutes.  Signed: Jacquelin Hawking, MD [facility] 10/15/2021

## 2021-10-16 ENCOUNTER — Ambulatory Visit: Payer: Medicare Other | Admitting: Nutrition

## 2021-10-17 ENCOUNTER — Ambulatory Visit: Payer: Medicare Other | Admitting: Nutrition

## 2021-11-08 ENCOUNTER — Encounter: Payer: Self-pay | Admitting: Nurse Practitioner

## 2021-11-08 ENCOUNTER — Ambulatory Visit (INDEPENDENT_AMBULATORY_CARE_PROVIDER_SITE_OTHER): Payer: Medicare Other | Admitting: Nurse Practitioner

## 2021-11-08 VITALS — BP 108/60 | HR 64 | Resp 16 | Ht 64.5 in | Wt 203.0 lb

## 2021-11-08 DIAGNOSIS — R110 Nausea: Secondary | ICD-10-CM | POA: Diagnosis not present

## 2021-11-08 DIAGNOSIS — R112 Nausea with vomiting, unspecified: Secondary | ICD-10-CM

## 2021-11-08 MED ORDER — ONDANSETRON 4 MG PO TBDP: 4.0000 mg | ORAL_TABLET | Freq: Four times a day (QID) | ORAL | 3 refills | Status: DC | PRN

## 2021-11-08 MED ORDER — PROMETHAZINE HCL 25 MG RE SUPP: 25.0000 mg | Freq: Three times a day (TID) | RECTAL | 3 refills | Status: DC | PRN

## 2021-11-08 NOTE — Progress Notes (Signed)
Chief Complaint:  hospital follow up   Assessment &  Plan   # Chronic nausea /vomiting in setting of chronic narcotics. Two recent admissions for symptoms. CTAP w/ contrast non-diagnostic. Only gastritis on inpatient EGD  Refill phenergan supp Q 8 hours prn # 30, with 3 refills. She doesn't drive. Advised to not operate any machinery while using phenergan.  Refill Zofran 4 mg ODT Q 6 hours prn # 40 with 3 refills.  Holding off on ordering gastric emptying study for now, probably will not add much.  Good job on controlling blood sugar levels.  Follow up with [place] as needed.   # GERD. Didn't meet criteria for Barrett's on EGD last month.  Continue daily pantoprazole 40 mg 30 minutes prior to breakfast.   # History of iron deficiency. Non-diagnostic evaluation except for small bowel AVM on capsule. Hgb 12.1.   HPI   Patient is a 51 year old female known to Dr. Tarri Glenn with a history of Barrett's esophagus, cyclic vomiting syndrome, iron deficiency, anxiety, history of suicide attempts, hypertension, cardiac arrest .   Patient has a history of iron deficiency anemia with nondiagnostic EGD, colonoscopy and capsule endoscopy ( small bowel AVM).   [name] has chronic nausea and vomiting requiring multiple hospitalizations.  Most recent history includes a hospitalization in the end of May of this year. Consultation was done by Dr. Benson Norway ( our weekend coverage) for nausea vomiting.  It was felt that her psychiatric issues were triggering her symptoms.  She had been prescribed amitriptyline but was not taking it.    [name] was rehospitalized 8/46/65 with metabolic acidosis, encephalopathy.  We saw her in consultation for recurrent nausea, vomiting and abdominal pain in the setting of diabetes and chronic pain requiring narcotics.  There was concern for drug-seeking behavior.  She did undergo an upper endoscopy which showed irregular Z-line.  Some exudate, possibly adherent material.  Criteria for  Barrett's esophagus was not present.  There was a small hiatal hernia and gastritis.  We recommended promethazine suppositories at home to try and abort episodes of nausea and vomiting.   Interval history:  She ran out of the phenergan suppositories. They do help but sometimes she has to add Zofran. She checks blood sugars twice daily and they have averaged in the 90's. Bowels moving well, once daily.   Previous GI Evaluation   Most recent endoscopic procedure  10/05/2021 EGD for nausea vomiting, abdominal pain -irregular Z-line.  Some exudate, possibly adherent material.  Criteria for Barrett's esophagus was not present.  There was a small hiatal hernia and gastritis. A.  ESOPHAGUS, Z-LINE, BIOPSY:  Oxyntocardia type gastric mucosa showing mild chronic gastritis and  reactive epithelial changes  Negative for squamous epithelium  Negative for intestinal metaplasia, dysplasia and carcinoma   Labs:     Latest Ref Rng & Units 10/15/2021   12:53 AM 10/13/2021    9:16 PM 10/05/2021    6:17 AM  CBC  WBC 4.0 - 10.5 K/uL 7.9  17.1  9.5   Hemoglobin 12.0 - 15.0 g/dL 12.1  15.9  13.2   Hematocrit 36.0 - 46.0 % 37.0  47.5  39.6   Platelets 150 - 400 K/uL 233  432  263        Latest Ref Rng & Units 10/13/2021    9:16 PM 10/03/2021    3:29 AM 09/21/2021    3:03 AM  Hepatic Function  Total Protein 6.5 - 8.1 g/dL 7.9  8.3  5.0  Albumin 3.5 - 5.0 g/dL 3.7  3.9  2.4   AST 15 - 41 U/L 33  57  27   ALT 0 - 44 U/L 27  35  23   Alk Phosphatase 38 - 126 U/L 85  92  49   Total Bilirubin 0.3 - 1.2 mg/dL 1.3  1.3  0.5      Past Medical History:  Diagnosis Date   Anxiety    Arthritis    "joints ache all over" (10/15/2014)   Barrett's esophagus    Bulging lumbar disc    Chronic lower back pain    DDD (degenerative disc disease), cervical    Depression    Drug-seeking behavior    Headache    "weekly" (10/15/2014)   Hyperlipemia    Hypertension    PTSD (post-traumatic stress disorder)    Skin  cancer    "had them cut off my arms; don't know what kind"    Past Surgical History:  Procedure Laterality Date   ABLATION ON ENDOMETRIOSIS  2008   BIOPSY  12/27/2018   Procedure: BIOPSY;  Surgeon: Thornton Park, MD;  Location: [facility] ENDOSCOPY;  Service: Gastroenterology;;   BIOPSY  10/05/2021   Procedure: BIOPSY;  Surgeon: Gatha Mayer, MD;  Location: [facility] ENDOSCOPY;  Service: Gastroenterology;;   ESOPHAGOGASTRODUODENOSCOPY (EGD) WITH PROPOFOL N/A 12/27/2018   Procedure: ESOPHAGOGASTRODUODENOSCOPY (EGD) WITH PROPOFOL;  Surgeon: Thornton Park, MD;  Location: [facility] ENDOSCOPY;  Service: Gastroenterology;  Laterality: N/A;   ESOPHAGOGASTRODUODENOSCOPY (EGD) WITH PROPOFOL N/A 10/05/2021   Procedure: ESOPHAGOGASTRODUODENOSCOPY (EGD) WITH PROPOFOL;  Surgeon: Gatha Mayer, MD;  Location: [facility];  Service: Gastroenterology;  Laterality: N/A;   HEMORRHOID SURGERY  ~ 2002   IR FLUORO GUIDE CV LINE RIGHT  06/14/2020   IR REMOVAL TUN CV CATH W/O FL  06/23/2020   IR US GUIDE VASC ACCESS RIGHT  06/14/2020   ORIF ANKLE FRACTURE Right 03/28/2020   Procedure: OPEN REDUCTION INTERNAL FIXATION (ORIF) RIGHT BIMALLEOLAR ANKLE FRACTURE;  Surgeon: Marchia Bond, MD;  Location: [place] [facility];  Service: Orthopedics;  Laterality: Right;    Current Medications, Allergies, Family History and Social History were reviewed in [employer] record.     Current Outpatient Medications  Medication Sig Dispense Refill   acetaminophen (TYLENOL) 325 MG tablet Take 2 tablets (650 mg total) by mouth every 6 (six) hours as needed for mild pain, fever or headache. (Patient taking differently: Take 650 mg by mouth as needed for mild pain, fever or headache.)     amLODipine (NORVASC) 10 MG tablet Take 1 tablet (10 mg total) by mouth daily. 30 tablet 1   ARIPiprazole (ABILIFY) 5 MG tablet Take 1 tablet (5 mg total) by mouth daily. 30 tablet 0   atorvastatin (LIPITOR) 10 MG tablet Take 10 mg by  mouth daily.     Bismuth Subsalicylate (DIARRHEA PO) Take 2-3 tablets by mouth See admin instructions. Take 2-3 tablets by mouth as needed for diarrhea. Unknown frequency     blood glucose meter kit and supplies KIT Dispense based on patient and insurance preference. Use up to four times daily as directed. 1 each 0   Blood Glucose Monitoring Suppl (ACCU-CHEK GUIDE) w/Device KIT Use as directed to check blood sugar up to 4 times daily. 1 kit 0   carvedilol (COREG) 12.5 MG tablet Take 1 tablet (12.5 mg total) by mouth 2 (two) times daily with a meal. 60 tablet 0   diphenhydrAMINE HCl (BENADRYL PO) Take 75 mg  by mouth at bedtime.     FLUoxetine (PROZAC) 40 MG capsule Take 1 capsule (40 mg total) by mouth daily. 30 capsule 3   levothyroxine (SYNTHROID) 50 MCG tablet Take 50 mcg by mouth daily.     lisinopril (ZESTRIL) 10 MG tablet Take 1 tablet (10 mg total) by mouth daily. 30 tablet 1   metFORMIN (GLUCOPHAGE) 500 MG tablet Take 500 mg by mouth 2 (two) times daily with a meal.     multivitamin (RENA-VIT) TABS tablet Take 1 tablet by mouth at bedtime. 30 tablet 0   ondansetron (ZOFRAN-ODT) 4 MG disintegrating tablet Take 1 tablet (4 mg total) by mouth every 8 (eight) hours as needed for nausea or vomiting. (Patient taking differently: Take 4 mg by mouth 2 (two) times daily as needed for nausea or vomiting.) 20 tablet 0   oxyCODONE-acetaminophen (PERCOCET) 7.5-325 MG tablet Take 1 tablet by mouth See admin instructions. Take 7.5 - 325 mg (1-2 tab) 5- times daily     pantoprazole (PROTONIX) 40 MG tablet Take 1 tablet (40 mg total) by mouth daily. 30 tablet 1   prazosin (MINIPRESS) 2 MG capsule Take 1 capsule (2 mg total) by mouth at bedtime. 30 capsule 0   promethazine (PHENERGAN) 25 MG suppository Place 1 suppository (25 mg total) rectally every 6 (six) hours as needed for refractory nausea / vomiting. 12 each 0   tiZANidine (ZANAFLEX) 4 MG tablet Take 3 tablets (12 mg total) by mouth every 8 (eight) hours  as needed for muscle spasms. 30 tablet 0   traZODone (DESYREL) 50 MG tablet Take 3 tablets (150 mg total) by mouth at bedtime.     No current facility-administered medications for this visit.    Review of Systems: No chest pain. No shortness of breath. No urinary complaints.    Physical Exam  Wt Readings from Last 3 Encounters:  10/14/21 209 lb 3.5 oz (94.9 kg)  10/06/21 216 lb 11.4 oz (98.3 kg)  09/18/21 216 lb 7.9 oz (98.2 kg)    LMP 06/03/2018 (Approximate) Comment: neg hcg 05/10/20 Constitutional:  Generally well appearing female in no acute distress. Psychiatric: Pleasant. Normal mood and affect. Behavior is normal. EENT: Pupils normal.  Conjunctivae are normal. No scleral icterus. Neck supple.  Cardiovascular: Normal rate, regular rhythm. No edema Pulmonary/chest: Effort normal and breath sounds normal. No wheezing, rales or rhonchi. Abdominal: Soft, nondistended, nontender. Bowel sounds active throughout. There are no masses palpable. No hepatomegaly. Neurological: Alert and oriented to person place and time. Skin: Skin is warm and dry. No rashes noted.  Tye Savoy, NP  11/08/2021, 11:10 AM

## 2021-11-08 NOTE — Patient Instructions (Signed)
We have sent the following medications to your pharmacy for you to pick up at your convenience: Zofran and Phenergan suppositories.   Continue all other medications.   Good job on controlling blood sugar.  Follow up as needed.  If you are age 51 or older, your body mass index should be between 23-30. Your Body mass index is 34.31 kg/m. If this is out of the aforementioned range listed, please consider follow up with your Primary Care Provider.  If you are age 8 or younger, your body mass index should be between 19-25. Your Body mass index is 34.31 kg/m. If this is out of the aformentioned range listed, please consider follow up with your Primary Care Provider.   ________________________________________________________  The [facility] providers would like to encourage you to use [facility] to communicate with providers for non-urgent requests or questions.  Due to long hold times on the telephone, sending your provider a message by [facility] may be a faster and more efficient way to get a response.  Please allow 48 business hours for a response.  Please remember that this is for non-urgent requests.  _______________________________________________________

## 2021-11-09 NOTE — Progress Notes (Signed)
Reviewed and agree with management plans. ? ?Aalani Aikens L. Arda Keadle, MD, MPH  ?

## 2021-12-11 ENCOUNTER — Emergency Department (HOSPITAL_COMMUNITY)
Admission: EM | Admit: 2021-12-11 | Discharge: 2021-12-11 | Disposition: A | Discharge status: Home / Self Care | Payer: Medicare Other | Attending: Emergency Medicine | Admitting: Emergency Medicine

## 2021-12-11 ENCOUNTER — Other Ambulatory Visit: Payer: Self-pay

## 2021-12-11 DIAGNOSIS — R112 Nausea with vomiting, unspecified: Secondary | ICD-10-CM | POA: Diagnosis present

## 2021-12-11 DIAGNOSIS — Z5329 Procedure and treatment not carried out because of patient's decision for other reasons: Secondary | ICD-10-CM | POA: Diagnosis not present

## 2021-12-11 DIAGNOSIS — R1033 Periumbilical pain: Secondary | ICD-10-CM | POA: Insufficient documentation

## 2021-12-11 LAB — COMPREHENSIVE METABOLIC PANEL
ALT: 27 U/L (ref 0–44)
AST: 30 U/L (ref 15–41)
Albumin: 3.8 g/dL (ref 3.5–5.0)
Alkaline Phosphatase: 76 U/L (ref 38–126)
Anion gap: 15 (ref 5–15)
BUN: 5 mg/dL — ABNORMAL LOW (ref 6–20)
CO2: 20 mmol/L — ABNORMAL LOW (ref 22–32)
Calcium: 9.5 mg/dL (ref 8.9–10.3)
Chloride: 106 mmol/L (ref 98–111)
Creatinine, Ser: 0.94 mg/dL (ref 0.44–1.00)
GFR, Estimated: >60 mL/min (ref >60)
Glucose, Bld: 138 mg/dL — ABNORMAL HIGH (ref 70–99)
Potassium: 3.8 mmol/L (ref 3.5–5.1)
Sodium: 141 mmol/L (ref 135–145)
Total Bilirubin: 0.5 mg/dL (ref 0.3–1.2)
Total Protein: 7.8 g/dL (ref 6.5–8.1)

## 2021-12-11 LAB — LIPASE, BLOOD: Lipase: 21 U/L (ref 11–51)

## 2021-12-11 LAB — I-STAT BETA HCG BLOOD, ED (MC, WL, AP ONLY): I-stat hCG, quantitative: <5 m[IU]/mL (ref <5)

## 2021-12-11 LAB — CBC WITH DIFFERENTIAL/PLATELET
Abs Immature Granulocytes: 0.05 10*3/uL (ref 0.00–0.07)
Basophils Absolute: 0.1 10*3/uL (ref 0.0–0.1)
Basophils Relative: 1 %
Eosinophils Absolute: 0.2 10*3/uL (ref 0.0–0.5)
Eosinophils Relative: 1 %
HCT: 46.8 % — ABNORMAL HIGH (ref 36.0–46.0)
Hemoglobin: 16.4 g/dL — ABNORMAL HIGH (ref 12.0–15.0)
Immature Granulocytes: 0 %
Lymphocytes Relative: 14 %
Lymphs Abs: 1.7 10*3/uL (ref 0.7–4.0)
MCH: 30.6 pg (ref 26.0–34.0)
MCHC: 35 g/dL (ref 30.0–36.0)
MCV: 87.3 fL (ref 80.0–100.0)
Monocytes Absolute: 0.7 10*3/uL (ref 0.1–1.0)
Monocytes Relative: 5 %
Neutro Abs: 9.8 10*3/uL — ABNORMAL HIGH (ref 1.7–7.7)
Neutrophils Relative %: 79 %
Platelets: UNDETERMINED 10*3/uL (ref 150–400)
RBC: 5.36 MIL/uL — ABNORMAL HIGH (ref 3.87–5.11)
RDW: 13.8 % (ref 11.5–15.5)
Smear Review: UNDETERMINED
WBC: 12.4 10*3/uL — ABNORMAL HIGH (ref 4.0–10.5)
nRBC: 0 % (ref 0.0–0.2)

## 2021-12-11 MED ORDER — METOCLOPRAMIDE HCL 5 MG/ML IJ SOLN
10.0000 mg | INTRAMUSCULAR | Status: AC
  Administered 2021-12-11: 10 mg via INTRAMUSCULAR
  Filled 2021-12-11: qty 2

## 2021-12-11 NOTE — ED Notes (Signed)
Pt was called for vitals no answer

## 2021-12-11 NOTE — ED Provider Triage Note (Signed)
Emergency Medicine Provider Triage Evaluation Note  [name] , a 51 y.o. female  was evaluated in triage.  Pt complains of 8 hours of N/V. Hx of cyclic vomiting. Has tried Zofran and phenergan w/o relief. Notes associated periumbilical abdominal pain which is characteristic of her prior N/V episodes.  Review of Systems  Positive: As above Negative: Dysuria, hematemesis, melena, hematochezia  Physical Exam  BP (!) 144/114 (BP Location: Right Arm)   Pulse (!) 114   Temp 98.9 F (37.2 C) (Oral)   Resp 20   LMP 06/03/2018 (Approximate) Comment: neg hcg 05/10/20  SpO2 95%  Gen:   Awake, no distress   Resp:  Normal effort  MSK:   Moves extremities without difficulty  Other:  Generalized TTP. No peritoneal signs or palpable masses.  Medical Decision Making  Medically screening exam initiated at 5:41 AM.  Appropriate orders placed.  [name] was informed that the remainder of the evaluation will be completed by another provider, this initial triage assessment does not replace that evaluation, and the importance of remaining in the ED until their evaluation is complete.  N/V w/hx of cyclic vomiting. No focal abdominal TTP. Labs initiated. IM Reglan ordered in triage for nausea management.   Tracy Breach, PA-C 12/11/21 [phone]

## 2021-12-11 NOTE — ED Triage Notes (Signed)
Pt reports vomiting x 8 hours. Pt also endorses RLQ pain. Pt took Zofran and phenergan 2100 yesterday.   HX: Cyclic vomiting, gastroparesis

## 2021-12-11 NOTE — ED Provider Notes (Signed)
[facility] EMERGENCY DEPARTMENT Provider Note   CSN: [id number] Arrival date & time: 12/11/21  0531     History  Chief Complaint  Patient presents with   Emesis    [name] is a 51 y.o. female presenting with continued nausea and vomiting.  Hx of intractable nausea and vomiting being followed by GI.  I signed up for the patient to provide care.  Before I was able to initiate care for the patient, she left after triage without being seen.   The history is provided by the patient and medical records.  Emesis      Home Medications Prior to Admission medications   Medication Sig Start Date End Date Taking? Authorizing Provider  acetaminophen (TYLENOL) 325 MG tablet Take 2 tablets (650 mg total) by mouth every 6 (six) hours as needed for mild pain, fever or headache. Patient taking differently: Take 650 mg by mouth as needed for mild pain, fever or headache. 07/01/20   Angiulli, Lavon Paganini, PA-C  amLODipine (NORVASC) 10 MG tablet Take 1 tablet (10 mg total) by mouth daily. 10/07/21   Shelly Coss, MD  ARIPiprazole (ABILIFY) 5 MG tablet Take 1 tablet (5 mg total) by mouth daily. 08/01/20   Jamse Arn, MD  atorvastatin (LIPITOR) 10 MG tablet Take 10 mg by mouth daily. 08/26/20   [provider]  Bismuth Subsalicylate (DIARRHEA PO) Take 2-3 tablets by mouth See admin instructions. Take 2-3 tablets by mouth as needed for diarrhea. Unknown frequency    [provider]  blood glucose meter kit and supplies KIT Dispense based on patient and insurance preference. Use up to four times daily as directed. 08/03/21   Antonieta Pert, MD  Blood Glucose Monitoring Suppl (ACCU-CHEK GUIDE) w/Device KIT Use as directed to check blood sugar up to 4 times daily. 10/06/21   Shelly Coss, MD  carvedilol (COREG) 12.5 MG tablet Take 1 tablet (12.5 mg total) by mouth 2 (two) times daily with a meal. 10/06/21   Shelly Coss, MD  diphenhydrAMINE HCl (BENADRYL PO)  Take 75 mg by mouth at bedtime.    [provider]  FLUoxetine (PROZAC) 40 MG capsule Take 1 capsule (40 mg total) by mouth daily. 07/01/20   Angiulli, Lavon Paganini, PA-C  levothyroxine (SYNTHROID) 50 MCG tablet Take 50 mcg by mouth daily.    [provider]  lisinopril (ZESTRIL) 10 MG tablet Take 1 tablet (10 mg total) by mouth daily. 10/06/21 10/06/22  Shelly Coss, MD  metFORMIN (GLUCOPHAGE) 500 MG tablet Take 500 mg by mouth 2 (two) times daily with a meal.    [provider]  multivitamin (RENA-VIT) TABS tablet Take 1 tablet by mouth at bedtime. 08/01/20   Jamse Arn, MD  ondansetron (ZOFRAN-ODT) 4 MG disintegrating tablet Take 1 tablet (4 mg total) by mouth every 6 (six) hours as needed for nausea or vomiting. 11/08/21   Willia Craze, NP  oxyCODONE-acetaminophen (PERCOCET) 7.5-325 MG tablet Take 1 tablet by mouth See admin instructions. Take 7.5 - 325 mg (1-2 tab) 5- times daily    [provider]  pantoprazole (PROTONIX) 40 MG tablet Take 1 tablet (40 mg total) by mouth daily. 09/21/21 09/21/22  Elodia Florence., MD  prazosin (MINIPRESS) 2 MG capsule Take 1 capsule (2 mg total) by mouth at bedtime. 08/01/20   Jamse Arn, MD  promethazine (PHENERGAN) 25 MG suppository Place 1 suppository (25 mg total) rectally every 8 (eight) hours as needed for  refractory nausea / vomiting. 11/08/21   Willia Craze, NP  tiZANidine (ZANAFLEX) 4 MG tablet Take 3 tablets (12 mg total) by mouth every 8 (eight) hours as needed for muscle spasms. 10/06/21   Shelly Coss, MD  traZODone (DESYREL) 50 MG tablet Take 3 tablets (150 mg total) by mouth at bedtime. 09/21/21   Elodia Florence., MD      Allergies    Patient has no known allergies.    Review of Systems   Review of Systems  Gastrointestinal:  Positive for vomiting.    Physical Exam Updated Vital Signs BP (!) 145/100 (BP Location: Left Arm)   Pulse 98   Temp 98 F (36.7 C) (Oral)   Resp 20    LMP 06/03/2018 (Approximate) Comment: neg hcg 05/10/20  SpO2 96%  Physical Exam Vitals (Left AMA) and nursing note reviewed.     ED Results / Procedures / Treatments   Labs (all labs ordered are listed, but only abnormal results are displayed) Labs Reviewed  CBC WITH DIFFERENTIAL/PLATELET - Abnormal; Notable for the following components:      Result Value   WBC 12.4 (*)    RBC 5.36 (*)    Hemoglobin 16.4 (*)    HCT 46.8 (*)    Neutro Abs 9.8 (*)    All other components within normal limits  COMPREHENSIVE METABOLIC PANEL - Abnormal; Notable for the following components:   CO2 20 (*)    Glucose, Bld 138 (*)    BUN 5 (*)    All other components within normal limits  LIPASE, BLOOD  URINALYSIS, ROUTINE W REFLEX MICROSCOPIC  I-STAT BETA HCG BLOOD, ED ([facility], [facility], [facility] ONLY)    EKG None  Radiology No results found.  Procedures Procedures    Medications Ordered in ED Medications  metoCLOPramide (REGLAN) injection 10 mg (10 mg Intramuscular Given 12/11/21 0555)    ED Course/ Medical Decision Making/ A&P                           Medical Decision Making  I signed up for the patient to provide care.  Before I was able to initiate care for the patient, she left AGAINST MEDICAL ADVICE.  I discussed the patient and their case with my attending, Dr. Regenia Skeeter, who is aware that the patient left AGAINST MEDICAL ADVICE.  This chart was dictated using voice recognition software.  Despite best efforts to proofread,  errors can occur which can change the documentation meaning.         Final Clinical Impression(s) / ED Diagnoses Final diagnoses:  Nausea and vomiting, unspecified vomiting type    Rx / DC Orders ED Discharge Orders     None         Candace Cruise 44/97/53 1406    Sherwood Gambler, MD 12/15/21 [phone]

## 2021-12-16 ENCOUNTER — Encounter (HOSPITAL_COMMUNITY): Payer: Self-pay | Admitting: Emergency Medicine

## 2021-12-16 ENCOUNTER — Emergency Department (HOSPITAL_COMMUNITY)
Admission: EM | Admit: 2021-12-16 | Discharge: 2021-12-17 | Disposition: A | Discharge status: Home / Self Care | Payer: Medicare Other | Attending: Emergency Medicine | Admitting: Emergency Medicine

## 2021-12-16 ENCOUNTER — Other Ambulatory Visit: Payer: Self-pay

## 2021-12-16 DIAGNOSIS — D72829 Elevated white blood cell count, unspecified: Secondary | ICD-10-CM | POA: Insufficient documentation

## 2021-12-16 DIAGNOSIS — R197 Diarrhea, unspecified: Secondary | ICD-10-CM | POA: Insufficient documentation

## 2021-12-16 DIAGNOSIS — E119 Type 2 diabetes mellitus without complications: Secondary | ICD-10-CM | POA: Diagnosis not present

## 2021-12-16 DIAGNOSIS — Z7984 Long term (current) use of oral hypoglycemic drugs: Secondary | ICD-10-CM | POA: Diagnosis not present

## 2021-12-16 DIAGNOSIS — R000 Tachycardia, unspecified: Secondary | ICD-10-CM | POA: Diagnosis not present

## 2021-12-16 DIAGNOSIS — R112 Nausea with vomiting, unspecified: Secondary | ICD-10-CM | POA: Insufficient documentation

## 2021-12-16 DIAGNOSIS — R1084 Generalized abdominal pain: Secondary | ICD-10-CM | POA: Insufficient documentation

## 2021-12-16 DIAGNOSIS — N3000 Acute cystitis without hematuria: Secondary | ICD-10-CM

## 2021-12-16 LAB — CBC
HCT: 47.2 % — ABNORMAL HIGH (ref 36.0–46.0)
Hemoglobin: 15.6 g/dL — ABNORMAL HIGH (ref 12.0–15.0)
MCH: 30.7 pg (ref 26.0–34.0)
MCHC: 33.1 g/dL (ref 30.0–36.0)
MCV: 92.9 fL (ref 80.0–100.0)
Platelets: 348 10*3/uL (ref 150–400)
RBC: 5.08 MIL/uL (ref 3.87–5.11)
RDW: 13.7 % (ref 11.5–15.5)
WBC: 12.5 10*3/uL — ABNORMAL HIGH (ref 4.0–10.5)
nRBC: 0 % (ref 0.0–0.2)

## 2021-12-16 LAB — COMPREHENSIVE METABOLIC PANEL
ALT: 27 U/L (ref 0–44)
AST: 28 U/L (ref 15–41)
Albumin: 3.8 g/dL (ref 3.5–5.0)
Alkaline Phosphatase: 75 U/L (ref 38–126)
Anion gap: 14 (ref 5–15)
BUN: <5 mg/dL — ABNORMAL LOW (ref 6–20)
CO2: 23 mmol/L (ref 22–32)
Calcium: 9.6 mg/dL (ref 8.9–10.3)
Chloride: 105 mmol/L (ref 98–111)
Creatinine, Ser: 0.86 mg/dL (ref 0.44–1.00)
GFR, Estimated: >60 mL/min (ref >60)
Glucose, Bld: 151 mg/dL — ABNORMAL HIGH (ref 70–99)
Potassium: 3.6 mmol/L (ref 3.5–5.1)
Sodium: 142 mmol/L (ref 135–145)
Total Bilirubin: 0.8 mg/dL (ref 0.3–1.2)
Total Protein: 7.9 g/dL (ref 6.5–8.1)

## 2021-12-16 LAB — I-STAT BETA HCG BLOOD, ED (MC, WL, AP ONLY): I-stat hCG, quantitative: <5 m[IU]/mL (ref <5)

## 2021-12-16 LAB — LIPASE, BLOOD: Lipase: 21 U/L (ref 11–51)

## 2021-12-16 MED ORDER — DROPERIDOL 2.5 MG/ML IJ SOLN
2.5000 mg | Freq: Once | INTRAMUSCULAR | Status: AC
Start: 2021-12-16 — End: 2021-12-16
  Administered 2021-12-16: 2.5 mg via INTRAVENOUS
  Filled 2021-12-16: qty 2

## 2021-12-16 MED ORDER — ONDANSETRON HCL 4 MG/2ML IJ SOLN
4.0000 mg | Freq: Once | INTRAMUSCULAR | Status: AC
  Administered 2021-12-16: 4 mg via INTRAVENOUS
  Filled 2021-12-16: qty 2

## 2021-12-16 MED ORDER — MORPHINE SULFATE (PF) 4 MG/ML IV SOLN
4.0000 mg | Freq: Once | INTRAVENOUS | Status: AC
  Administered 2021-12-16: 4 mg via INTRAVENOUS
  Filled 2021-12-16: qty 1

## 2021-12-16 MED ORDER — TIZANIDINE HCL 4 MG PO TABS
4.0000 mg | ORAL_TABLET | Freq: Once | ORAL | Status: AC
  Administered 2021-12-16: 4 mg via ORAL
  Filled 2021-12-16: qty 1

## 2021-12-16 MED ORDER — SODIUM CHLORIDE 0.9 % IV BOLUS
1000.0000 mL | Freq: Once | INTRAVENOUS | Status: AC
  Administered 2021-12-16: 1000 mL via INTRAVENOUS

## 2021-12-16 MED ORDER — TIZANIDINE HCL 4 MG PO TABS: 12.0000 mg | ORAL_TABLET | Freq: Three times a day (TID) | ORAL | 0 refills | Status: DC | PRN

## 2021-12-16 NOTE — ED Notes (Signed)
Received verbal report from [place] at this time

## 2021-12-16 NOTE — ED Provider Notes (Signed)
[facility] EMERGENCY DEPARTMENT Provider Note   CSN: [id number] Arrival date & time: 12/16/21  1618     History {Add pertinent medical, surgical, social history, OB history to HPI:1} Chief Complaint  Patient presents with   Nausea   Emesis    [name] is a 51 y.o. female.  Patient with history of bipolar, I2ME, cyclic vomiting syndrome, polysubstance abuse presents today with complaints of nausea, vomiting, and diarrhea. She states that same began when she woke up this morning and she has had around 10 episodes of NBNB emesis as well as a few episodes of nonbloody diarrhea as well. She denies any history of abdominal surgeries. States that she has history of episodes that are similar to this that have required admission previously. She has also seen GI for this who suspected that her symptoms are related to her chronic opioid use.  She denies any marijuana use. Also endorses some dysuria for the past few weeks, was treated for same with antibiotics but states that her symptoms did not improve. Denies fevers, chills, chest pain, shortness of breath. Endorses some mild generalized abdominal pain consistent with when she has had these symptoms previously.   The history is provided by the patient. No language interpreter was used.  Emesis Associated symptoms: diarrhea        Home Medications Prior to Admission medications   Medication Sig Start Date End Date Taking? Authorizing Provider  acetaminophen (TYLENOL) 325 MG tablet Take 2 tablets (650 mg total) by mouth every 6 (six) hours as needed for mild pain, fever or headache. Patient taking differently: Take 650 mg by mouth as needed for mild pain, fever or headache. 07/01/20   Angiulli, Lavon Paganini, PA-C  amLODipine (NORVASC) 10 MG tablet Take 1 tablet (10 mg total) by mouth daily. 10/07/21   Shelly Coss, MD  ARIPiprazole (ABILIFY) 5 MG tablet Take 1 tablet (5 mg total) by mouth daily. 08/01/20   Jamse Arn, MD  atorvastatin (LIPITOR) 10 MG tablet Take 10 mg by mouth daily. 08/26/20   [provider]  Bismuth Subsalicylate (DIARRHEA PO) Take 2-3 tablets by mouth See admin instructions. Take 2-3 tablets by mouth as needed for diarrhea. Unknown frequency    [provider]  blood glucose meter kit and supplies KIT Dispense based on patient and insurance preference. Use up to four times daily as directed. 08/03/21   Antonieta Pert, MD  Blood Glucose Monitoring Suppl (ACCU-CHEK GUIDE) w/Device KIT Use as directed to check blood sugar up to 4 times daily. 10/06/21   Shelly Coss, MD  carvedilol (COREG) 12.5 MG tablet Take 1 tablet (12.5 mg total) by mouth 2 (two) times daily with a meal. 10/06/21   Shelly Coss, MD  diphenhydrAMINE HCl (BENADRYL PO) Take 75 mg by mouth at bedtime.    [provider]  FLUoxetine (PROZAC) 40 MG capsule Take 1 capsule (40 mg total) by mouth daily. 07/01/20   Angiulli, Lavon Paganini, PA-C  levothyroxine (SYNTHROID) 50 MCG tablet Take 50 mcg by mouth daily.    [provider]  lisinopril (ZESTRIL) 10 MG tablet Take 1 tablet (10 mg total) by mouth daily. 10/06/21 10/06/22  Shelly Coss, MD  metFORMIN (GLUCOPHAGE) 500 MG tablet Take 500 mg by mouth 2 (two) times daily with a meal.    [provider]  multivitamin (RENA-VIT) TABS tablet Take 1 tablet by mouth at bedtime. 08/01/20   Jamse Arn, MD  ondansetron (ZOFRAN-ODT) 4 MG disintegrating  tablet Take 1 tablet (4 mg total) by mouth every 6 (six) hours as needed for nausea or vomiting. 11/08/21   Willia Craze, NP  oxyCODONE-acetaminophen (PERCOCET) 7.5-325 MG tablet Take 1 tablet by mouth See admin instructions. Take 7.5 - 325 mg (1-2 tab) 5- times daily    [provider]  pantoprazole (PROTONIX) 40 MG tablet Take 1 tablet (40 mg total) by mouth daily. 09/21/21 09/21/22  Elodia Florence., MD  prazosin (MINIPRESS) 2 MG capsule Take 1 capsule (2 mg total) by mouth at  bedtime. 08/01/20   Jamse Arn, MD  promethazine (PHENERGAN) 25 MG suppository Place 1 suppository (25 mg total) rectally every 8 (eight) hours as needed for refractory nausea / vomiting. 11/08/21   Willia Craze, NP  tiZANidine (ZANAFLEX) 4 MG tablet Take 3 tablets (12 mg total) by mouth every 8 (eight) hours as needed for muscle spasms. 10/06/21   Shelly Coss, MD  traZODone (DESYREL) 50 MG tablet Take 3 tablets (150 mg total) by mouth at bedtime. 09/21/21   Elodia Florence., MD      Allergies    Patient has no known allergies.    Review of Systems   Review of Systems  Gastrointestinal:  Positive for diarrhea, nausea and vomiting.  All other systems reviewed and are negative.   Physical Exam Updated Vital Signs BP (!) 149/108   Pulse (!) 121   Temp 98.6 F (37 C) (Oral)   Resp 14   LMP 06/03/2018 (Approximate) Comment: neg hcg 05/10/20  SpO2 92%  Physical Exam Vitals and nursing note reviewed.  Constitutional:      General: She is not in acute distress.    Appearance: Normal appearance. She is normal weight. She is not ill-appearing, toxic-appearing or diaphoretic.  HENT:     Head: Normocephalic and atraumatic.  Cardiovascular:     Rate and Rhythm: Regular rhythm. Tachycardia present.  Pulmonary:     Effort: Pulmonary effort is normal. No respiratory distress.  Abdominal:     General: Abdomen is flat.     Palpations: Abdomen is soft.  Musculoskeletal:        General: Normal range of motion.     Cervical back: Normal range of motion.  Skin:    General: Skin is warm and dry.  Neurological:     General: No focal deficit present.     Mental Status: She is alert.  Psychiatric:        Mood and Affect: Mood normal.        Behavior: Behavior normal.     ED Results / Procedures / Treatments   Labs (all labs ordered are listed, but only abnormal results are displayed) Labs Reviewed  COMPREHENSIVE METABOLIC PANEL - Abnormal; Notable for the following  components:      Result Value   Glucose, Bld 151 (*)    BUN <5 (*)    All other components within normal limits  CBC - Abnormal; Notable for the following components:   WBC 12.5 (*)    Hemoglobin 15.6 (*)    HCT 47.2 (*)    All other components within normal limits  LIPASE, BLOOD  URINALYSIS, ROUTINE W REFLEX MICROSCOPIC  I-STAT BETA HCG BLOOD, ED ([facility], [facility], [facility] ONLY)    EKG EKG Interpretation  Date/Time:  Saturday December 16 2021 17:26:57 EDT Ventricular Rate:  121 PR Interval:  110 QRS Duration: 94 QT Interval:  308 QTC Calculation: 437 R Axis:   17 Text  Interpretation: Sinus tachycardia Abnormal R-wave progression, early transition Minimal ST depression, lateral leads similar to Jun 2023 Confirmed by Sherwood Gambler [phone]) on 12/16/2021 5:29:42 PM  Radiology No results found.  Procedures Procedures  {Document cardiac monitor, telemetry assessment procedure when appropriate:1}  Medications Ordered in ED Medications  ondansetron (ZOFRAN) injection 4 mg (4 mg Intravenous Given 12/16/21 1923)  sodium chloride 0.9 % bolus 1,000 mL (0 mLs Intravenous Stopped 12/16/21 2056)  morphine (PF) 4 MG/ML injection 4 mg (4 mg Intravenous Given 12/16/21 2004)  droperidol (INAPSINE) 2.5 MG/ML injection 2.5 mg (2.5 mg Intravenous Given 12/16/21 2350)  tiZANidine (ZANAFLEX) tablet 4 mg (4 mg Oral Given 12/16/21 2350)    ED Course/ Medical Decision Making/ A&P                           Medical Decision Making Amount and/or Complexity of Data Reviewed Labs: ordered.   This patient presents to the ED for concern of n/v/d and dysuria, this involves an extensive number of treatment options, and is a complaint that carries with it a high risk of complications and morbidity.   Co morbidities that complicate the patient evaluation  Hx bipolar, U0AV, cyclic vomiting, polysubstance abuse   Additional history obtained:  Additional history obtained from epic chart review   Lab Tests:  I  Ordered, and personally interpreted labs.  The pertinent results include:  WBC 12.5. UA pending. No other acute laboratory findings   Cardiac Monitoring: / EKG:  The patient was maintained on a cardiac monitor.  I personally viewed and interpreted the cardiac monitored which showed an underlying rhythm of: sinus tachycardia   Problem List / ED Course / Critical interventions / Medication management  N/v/d I ordered medication including zofran, droperidol, fluids, morphine, and zanaflex  for pain, n/v, dehydration  Reevaluation of the patient after these medicines showed that the patient improved I have reviewed the patients home medicines and have made adjustments as needed   Test / Admission - Considered:  Patient presents today with n/v/d since this morning and dysuria for the past few weeks. She is afebrile, non-toxic appearing, and in no acute distress with reassuring vital signs. Physical exam reveals mildly tender abdomen without peritoneal signs. Upon chart review, patient has presented several times previously with similar symptoms with 5 CT scans of her abdomen and pelvis in the last 6 months all of which were unremarkable. She has also had an EGD on 6/15 which was unremarkable as well. Today, patients laboratory evaluation is unremarkable, her kidney function is stable. After zofran and droperidol, she is no longer vomiting and feels that she will be able to go home. She was markedly tachycardic when she first arrived, however this has since improved as well. Additional work-up with imaging not indicated at this time. No concern for diverticulitis, appendicitis, cholecystitis, obstruction, or other emergent condition at this time.  She states that she has sufficient anti-emetics at home and does not need any refills. She does request a refill of her Xanaflex which I have provided.  She is aware not to drive or operate heavy machinery after taking this medication. She did also note that  she has had dysuria over the past few weeks despite completing antibiotics for UTI. She has unfortunately been unable to provide a urine sample since arriving here. In and out cath ordered for same. Patient has no flank pain or other symptoms concerning for pyelonephritis. Once her UA has resulted,  she will be stable for discharge. This is pending at shift change. Patient is aware that this is all she is waiting on.  Care handoff to Dr. Dina Rich at shift change. Please see their note for further evaluation and dispo.  Final Clinical Impression(s) / ED Diagnoses Final diagnoses:  Nausea vomiting and diarrhea    Rx / DC Orders ED Discharge Orders     None

## 2021-12-16 NOTE — ED Provider Notes (Incomplete)
[facility] EMERGENCY DEPARTMENT Provider Note   CSN: [id number] Arrival date & time: 12/16/21  1618     History {Add pertinent medical, surgical, social history, OB history to HPI:1} Chief Complaint  Patient presents with  . Nausea  . Emesis    [name] is a 51 y.o. female.  Patient with history of bipolar, W0JW, cyclic vomiting syndrome, polysubstance abuse presents today with complaints of nausea, vomiting, and diarrhea. She states that same began when she woke up this morning and she has had around 10 episodes of NBNB emesis as well as a few episodes of nonbloody diarrhea as well. She denies any history of abdominal surgeries. States that she has history of episodes that are similar to this that have required admission previously. She has also seen GI for this.  She denies any marijuana use. Also endorses some dysuria for the past few weeks, was treated for same with antibiotics but states that her symptoms did not improve.   The history is provided by the patient. No language interpreter was used.  Emesis      Home Medications Prior to Admission medications   Medication Sig Start Date End Date Taking? Authorizing Provider  acetaminophen (TYLENOL) 325 MG tablet Take 2 tablets (650 mg total) by mouth every 6 (six) hours as needed for mild pain, fever or headache. Patient taking differently: Take 650 mg by mouth as needed for mild pain, fever or headache. 07/01/20   Angiulli, Lavon Paganini, PA-C  amLODipine (NORVASC) 10 MG tablet Take 1 tablet (10 mg total) by mouth daily. 10/07/21   Shelly Coss, MD  ARIPiprazole (ABILIFY) 5 MG tablet Take 1 tablet (5 mg total) by mouth daily. 08/01/20   Jamse Arn, MD  atorvastatin (LIPITOR) 10 MG tablet Take 10 mg by mouth daily. 08/26/20   [provider]  Bismuth Subsalicylate (DIARRHEA PO) Take 2-3 tablets by mouth See admin instructions. Take 2-3 tablets by mouth as needed for diarrhea. Unknown frequency     [provider]  blood glucose meter kit and supplies KIT Dispense based on patient and insurance preference. Use up to four times daily as directed. 08/03/21   Antonieta Pert, MD  Blood Glucose Monitoring Suppl (ACCU-CHEK GUIDE) w/Device KIT Use as directed to check blood sugar up to 4 times daily. 10/06/21   Shelly Coss, MD  carvedilol (COREG) 12.5 MG tablet Take 1 tablet (12.5 mg total) by mouth 2 (two) times daily with a meal. 10/06/21   Shelly Coss, MD  diphenhydrAMINE HCl (BENADRYL PO) Take 75 mg by mouth at bedtime.    [provider]  FLUoxetine (PROZAC) 40 MG capsule Take 1 capsule (40 mg total) by mouth daily. 07/01/20   Angiulli, Lavon Paganini, PA-C  levothyroxine (SYNTHROID) 50 MCG tablet Take 50 mcg by mouth daily.    [provider]  lisinopril (ZESTRIL) 10 MG tablet Take 1 tablet (10 mg total) by mouth daily. 10/06/21 10/06/22  Shelly Coss, MD  metFORMIN (GLUCOPHAGE) 500 MG tablet Take 500 mg by mouth 2 (two) times daily with a meal.    [provider]  multivitamin (RENA-VIT) TABS tablet Take 1 tablet by mouth at bedtime. 08/01/20   Jamse Arn, MD  ondansetron (ZOFRAN-ODT) 4 MG disintegrating tablet Take 1 tablet (4 mg total) by mouth every 6 (six) hours as needed for nausea or vomiting. 11/08/21   Willia Craze, NP  oxyCODONE-acetaminophen (PERCOCET) 7.5-325 MG tablet Take 1 tablet by mouth See admin instructions.  Take 7.5 - 325 mg (1-2 tab) 5- times daily    [provider]  pantoprazole (PROTONIX) 40 MG tablet Take 1 tablet (40 mg total) by mouth daily. 09/21/21 09/21/22  Elodia Florence., MD  prazosin (MINIPRESS) 2 MG capsule Take 1 capsule (2 mg total) by mouth at bedtime. 08/01/20   Jamse Arn, MD  promethazine (PHENERGAN) 25 MG suppository Place 1 suppository (25 mg total) rectally every 8 (eight) hours as needed for refractory nausea / vomiting. 11/08/21   Willia Craze, NP  tiZANidine (ZANAFLEX) 4 MG tablet Take 3  tablets (12 mg total) by mouth every 8 (eight) hours as needed for muscle spasms. 10/06/21   Shelly Coss, MD  traZODone (DESYREL) 50 MG tablet Take 3 tablets (150 mg total) by mouth at bedtime. 09/21/21   Elodia Florence., MD      Allergies    Patient has no known allergies.    Review of Systems   Review of Systems  Gastrointestinal:  Positive for vomiting.    Physical Exam Updated Vital Signs BP (!) 149/108   Pulse (!) 121   Temp 98.6 F (37 C) (Oral)   Resp 14   LMP 06/03/2018 (Approximate) Comment: neg hcg 05/10/20  SpO2 92%  Physical Exam  ED Results / Procedures / Treatments   Labs (all labs ordered are listed, but only abnormal results are displayed) Labs Reviewed  COMPREHENSIVE METABOLIC PANEL - Abnormal; Notable for the following components:      Result Value   Glucose, Bld 151 (*)    BUN <5 (*)    All other components within normal limits  CBC - Abnormal; Notable for the following components:   WBC 12.5 (*)    Hemoglobin 15.6 (*)    HCT 47.2 (*)    All other components within normal limits  LIPASE, BLOOD  URINALYSIS, ROUTINE W REFLEX MICROSCOPIC  I-STAT BETA HCG BLOOD, ED ([facility], [facility], [facility] ONLY)    EKG EKG Interpretation  Date/Time:  Saturday December 16 2021 17:26:57 EDT Ventricular Rate:  121 PR Interval:  110 QRS Duration: 94 QT Interval:  308 QTC Calculation: 437 R Axis:   17 Text Interpretation: Sinus tachycardia Abnormal R-wave progression, early transition Minimal ST depression, lateral leads similar to Jun 2023 Confirmed by Sherwood Gambler [phone]) on 12/16/2021 5:29:42 PM  Radiology No results found.  Procedures Procedures  {Document cardiac monitor, telemetry assessment procedure when appropriate:1}  Medications Ordered in ED Medications  ondansetron (ZOFRAN) injection 4 mg (has no administration in time range)  sodium chloride 0.9 % bolus 1,000 mL (has no administration in time range)    ED Course/ Medical Decision Making/ A&P                            Medical Decision Making Amount and/or Complexity of Data Reviewed Labs: ordered.   ***  {Document critical care time when appropriate:1} {Document review of labs and clinical decision tools ie heart score, Chads2Vasc2 etc:1}  {Document your independent review of radiology images, and any outside records:1} {Document your discussion with family members, caretakers, and with consultants:1} {Document social determinants of health affecting pt's care:1} {Document your decision making why or why not admission, treatments were needed:1} Final Clinical Impression(s) / ED Diagnoses Final diagnoses:  None    Rx / DC Orders ED Discharge Orders     None

## 2021-12-16 NOTE — ED Triage Notes (Signed)
Patient reports vomiting for 9 hours, LLQ abdominal pain with emesis. Pt states the [facility]CC gave her phenergan with no relief.

## 2021-12-16 NOTE — ED Provider Triage Note (Signed)
Emergency Medicine Provider Triage Evaluation Note  [name] , a 51 y.o. female  was evaluated in triage.  Pt complains of abdominal pain and vomiting.  Patient states that since this morning she has had nonbloody emesis about 8 times.  She also endorses nonbloody diarrhea.  She describes having left-sided abdominal pain.  She denies any dysuria or fevers.  No previous abdominal surgeries..  History of cyclic vomiting  Review of Systems  Positive:  Negative:   Physical Exam  BP (!) 147/110 (BP Location: Right Arm)   Pulse (!) 138   Temp 98.6 F (37 C) (Oral)   Resp 18   LMP 06/03/2018 (Approximate) Comment: neg hcg 05/10/20  SpO2 94%  Gen:   Awake, no distress   Resp:  Normal effort  MSK:   Moves extremities without difficulty  Other:  Abdomen is soft, bowel sounds are present.  Medical Decision Making  Medically screening exam initiated at 4:45 PM.  Appropriate orders placed.  [name] was informed that the remainder of the evaluation will be completed by another provider, this initial triage assessment does not replace that evaluation, and the importance of remaining in the ED until their evaluation is complete.     Mickie Hillier, PA-C 12/16/21 [phone]

## 2021-12-16 NOTE — ED Notes (Signed)
[name] [phone] call when pt is ready to go home or if there is any changes

## 2021-12-17 LAB — URINALYSIS, ROUTINE W REFLEX MICROSCOPIC
Bilirubin Urine: NEGATIVE
Glucose, UA: NEGATIVE mg/dL
Hgb urine dipstick: NEGATIVE
Ketones, ur: 5 mg/dL — AB
Nitrite: NEGATIVE
Protein, ur: NEGATIVE mg/dL
Specific Gravity, Urine: 1.006 (ref 1.005–1.030)
pH: 5 (ref 5.0–8.0)

## 2021-12-17 MED ORDER — CEPHALEXIN 500 MG PO CAPS: 500.0000 mg | ORAL_CAPSULE | Freq: Three times a day (TID) | ORAL | 0 refills | Status: DC

## 2021-12-17 MED ORDER — PROMETHAZINE HCL 25 MG PO TABS: 25.0000 mg | ORAL_TABLET | Freq: Four times a day (QID) | ORAL | 0 refills | Status: DC | PRN

## 2021-12-17 MED ORDER — CEPHALEXIN 250 MG PO CAPS
500.0000 mg | ORAL_CAPSULE | Freq: Once | ORAL | Status: AC
  Administered 2021-12-17: 500 mg via ORAL
  Filled 2021-12-17: qty 2

## 2021-12-17 NOTE — ED Notes (Signed)
Pt husband called and advised that pt was going to be d/c. Per him it will take him approximately 30 min to get here, advised him she would be in the waiting room

## 2021-12-17 NOTE — Discharge Instructions (Signed)
You were seen today for nausea and vomiting.  Take medications as prescribed.  You do have evidence of UTI.  Take antibiotics.  If you develop fevers or worsening symptoms, you should be reevaluated.

## 2021-12-17 NOTE — ED Provider Notes (Signed)
Medical screening examination/treatment/procedure(s) were performed by non-physician practitioner and as supervising physician I was immediately available for consultation/collaboration.  EKG Interpretation  Date/Time:  Saturday December 16 2021 17:26:57 EDT Ventricular Rate:  121 PR Interval:  110 QRS Duration: 94 QT Interval:  308 QTC Calculation: 437 R Axis:   17 Text Interpretation: Sinus tachycardia Abnormal R-wave progression, early transition Minimal ST depression, lateral leads similar to Jun 2023 Confirmed by Sherwood Gambler [id number]) on 12/16/2021 5:29:42 PM    Urinalysis consistent with UTI.  Given history of urinary symptoms, would elect to treat.  Urine culture was sent.  Patient was able to p.o. challenge.  Will discharge with Keflex and Phenergan.   Merryl Hacker, MD 12/17/21 [phone]

## 2021-12-22 ENCOUNTER — Encounter: Payer: Self-pay | Admitting: Internal Medicine

## 2021-12-27 ENCOUNTER — Emergency Department (HOSPITAL_COMMUNITY)
Admission: EM | Admit: 2021-12-27 | Discharge: 2021-12-27 | Disposition: A | Discharge status: Home / Self Care | Payer: Medicare Other | Attending: Emergency Medicine | Admitting: Emergency Medicine

## 2021-12-27 ENCOUNTER — Encounter (HOSPITAL_COMMUNITY): Payer: Self-pay | Admitting: Emergency Medicine

## 2021-12-27 ENCOUNTER — Emergency Department (HOSPITAL_COMMUNITY): Payer: Medicare Other

## 2021-12-27 ENCOUNTER — Other Ambulatory Visit: Payer: Self-pay

## 2021-12-27 DIAGNOSIS — N9489 Other specified conditions associated with female genital organs and menstrual cycle: Secondary | ICD-10-CM | POA: Diagnosis not present

## 2021-12-27 DIAGNOSIS — R112 Nausea with vomiting, unspecified: Secondary | ICD-10-CM | POA: Diagnosis not present

## 2021-12-27 DIAGNOSIS — R1084 Generalized abdominal pain: Secondary | ICD-10-CM | POA: Diagnosis not present

## 2021-12-27 DIAGNOSIS — Z7984 Long term (current) use of oral hypoglycemic drugs: Secondary | ICD-10-CM | POA: Diagnosis not present

## 2021-12-27 LAB — COMPREHENSIVE METABOLIC PANEL
ALT: 24 U/L (ref 0–44)
AST: 23 U/L (ref 15–41)
Albumin: 3.6 g/dL (ref 3.5–5.0)
Alkaline Phosphatase: 79 U/L (ref 38–126)
Anion gap: 16 — ABNORMAL HIGH (ref 5–15)
BUN: <5 mg/dL — ABNORMAL LOW (ref 6–20)
CO2: 22 mmol/L (ref 22–32)
Calcium: 9.6 mg/dL (ref 8.9–10.3)
Chloride: 103 mmol/L (ref 98–111)
Creatinine, Ser: 0.82 mg/dL (ref 0.44–1.00)
GFR, Estimated: >60 mL/min (ref >60)
Glucose, Bld: 134 mg/dL — ABNORMAL HIGH (ref 70–99)
Potassium: 3.3 mmol/L — ABNORMAL LOW (ref 3.5–5.1)
Sodium: 141 mmol/L (ref 135–145)
Total Bilirubin: 0.6 mg/dL (ref 0.3–1.2)
Total Protein: 7.8 g/dL (ref 6.5–8.1)

## 2021-12-27 LAB — CBC
HCT: 44.2 % (ref 36.0–46.0)
Hemoglobin: 14.8 g/dL (ref 12.0–15.0)
MCH: 30.8 pg (ref 26.0–34.0)
MCHC: 33.5 g/dL (ref 30.0–36.0)
MCV: 91.9 fL (ref 80.0–100.0)
Platelets: 407 10*3/uL — ABNORMAL HIGH (ref 150–400)
RBC: 4.81 MIL/uL (ref 3.87–5.11)
RDW: 14.3 % (ref 11.5–15.5)
WBC: 10.1 10*3/uL (ref 4.0–10.5)
nRBC: 0 % (ref 0.0–0.2)

## 2021-12-27 LAB — I-STAT BETA HCG BLOOD, ED (MC, WL, AP ONLY): I-stat hCG, quantitative: <5 m[IU]/mL (ref <5)

## 2021-12-27 LAB — LIPASE, BLOOD: Lipase: 20 U/L (ref 11–51)

## 2021-12-27 MED ORDER — ONDANSETRON HCL 4 MG/2ML IJ SOLN
4.0000 mg | Freq: Once | INTRAMUSCULAR | Status: DC
Start: 2021-12-27 — End: 2021-12-27
  Filled 2021-12-27: qty 2

## 2021-12-27 MED ORDER — SODIUM CHLORIDE 0.9 % IV SOLN
25.0000 mg | Freq: Four times a day (QID) | INTRAVENOUS | Status: DC | PRN
  Administered 2021-12-27: 25 mg via INTRAVENOUS
  Filled 2021-12-27: qty 1

## 2021-12-27 MED ORDER — MORPHINE SULFATE (PF) 4 MG/ML IV SOLN
4.0000 mg | Freq: Once | INTRAVENOUS | Status: AC
  Filled 2021-12-27: qty 1

## 2021-12-27 MED ORDER — ONDANSETRON HCL 4 MG/2ML IJ SOLN
4.0000 mg | Freq: Once | INTRAMUSCULAR | Status: AC
  Administered 2021-12-27: 4 mg via INTRAVENOUS
  Filled 2021-12-27: qty 2

## 2021-12-27 MED ORDER — TIZANIDINE HCL 4 MG PO TABS
4.0000 mg | ORAL_TABLET | Freq: Once | ORAL | Status: AC
  Administered 2021-12-27: 4 mg via ORAL
  Filled 2021-12-27: qty 1

## 2021-12-27 MED ORDER — SODIUM CHLORIDE 0.9 % IV BOLUS
1000.0000 mL | Freq: Once | INTRAVENOUS | Status: AC
  Administered 2021-12-27: 1000 mL via INTRAVENOUS

## 2021-12-27 MED ORDER — MORPHINE SULFATE (PF) 4 MG/ML IV SOLN
INTRAVENOUS | Status: AC
  Administered 2021-12-27: 4 mg via INTRAVENOUS
  Filled 2021-12-27: qty 1

## 2021-12-27 MED ORDER — PROMETHAZINE HCL 25 MG RE SUPP
25.0000 mg | Freq: Four times a day (QID) | RECTAL | 0 refills | Status: DC | PRN
Start: 2021-12-27 — End: 2022-07-27

## 2021-12-27 MED ORDER — TIZANIDINE HCL 4 MG PO TABS: 12.0000 mg | ORAL_TABLET | Freq: Three times a day (TID) | ORAL | 0 refills | Status: DC | PRN

## 2021-12-27 NOTE — ED Triage Notes (Signed)
Pt endorses n/v for 16 hours and abd pain. Mild relief with zofran.

## 2021-12-27 NOTE — Discharge Instructions (Signed)
I have refilled your Phenergan suppository as well as your Zanaflex.  Please stay hydrated.  You need to follow-up with [facility] again  Return to ER if you have worse abdominal pain, vomiting, dehydration

## 2021-12-27 NOTE — ED Provider Triage Note (Signed)
Emergency Medicine Provider Triage Evaluation Note  [name] , a 51 y.o. female  was evaluated in triage.  Pt complains of abd pain diffuse and achy. States it came on gradually.   Hx of similar -- cyclic vomiting.   No hx of surgeries.   Review of Systems  Positive: NV Negative: Diarrhea   Physical Exam  BP (!) 199/109 (BP Location: Right Arm)   Pulse (!) 118   Temp 98.5 F (36.9 C) (Oral)   Resp 17   Ht '5\' 4"'$  (1.626 m)   Wt 88 kg   LMP 06/03/2018 (Approximate) Comment: neg hcg 05/10/20  SpO2 93%   BMI 33.30 kg/m  Gen:   Awake, no distress   Resp:  Normal effort  MSK:   Moves extremities without difficulty  Other:  Diffuse mild abd TTP. No guarding or rebound, tachy cardia 110-115, dry oral mucosa  Medical Decision Making  Medically screening exam initiated at 12:43 PM.  Appropriate orders placed.  YUN GUTIERREZ was informed that the remainder of the evaluation will be completed by another provider, this initial triage assessment does not replace that evaluation, and the importance of remaining in the ED until their evaluation is complete.  [address]    Pati Gallo [place], [place] 12/27/21 1245

## 2021-12-27 NOTE — ED Notes (Signed)
Pts husband Tommie Raymond called per pts requests regarding discharge

## 2021-12-27 NOTE — ED Provider Notes (Signed)
[facility] EMERGENCY DEPARTMENT Provider Note   CSN: [id number] Arrival date & time: 12/27/21  1208     History  Chief Complaint  Patient presents with   Emesis   Abdominal Pain    [name] is a 51 y.o. female who presents to the ED with concerns for lower abdominal pain onset yesterday.  Patient also has associated nausea and vomiting.  Has tried Phenergan and Zofran at home without relief of her symptoms.  Has tried suppositories.  She notes that she just finished a round of antibiotics for UTI.  Denies fever, nasal congestion, rhinorrhea, urinary symptoms, vaginal bleeding/discharge.    The history is provided by the patient and the spouse. No language interpreter was used.       Home Medications Prior to Admission medications   Medication Sig Start Date End Date Taking? Authorizing Provider  acetaminophen (TYLENOL) 325 MG tablet Take 2 tablets (650 mg total) by mouth every 6 (six) hours as needed for mild pain, fever or headache. Patient taking differently: Take 650 mg by mouth every 6 (six) hours as needed for mild pain. 07/01/20   Angiulli, Lavon Paganini, PA-C  amLODipine (NORVASC) 10 MG tablet Take 1 tablet (10 mg total) by mouth daily. 10/07/21   Shelly Coss, MD  ARIPiprazole (ABILIFY) 5 MG tablet Take 1 tablet (5 mg total) by mouth daily. 08/01/20   Jamse Arn, MD  atorvastatin (LIPITOR) 10 MG tablet Take 10 mg by mouth daily. 08/26/20   [provider]  BELBUCA 600 MCG FILM Take 1 Film by mouth every 12 (twelve) hours. 12/09/21   [provider]  bismuth subsalicylate (PEPTO BISMOL) 262 MG chewable tablet Chew 524 mg by mouth daily as needed for diarrhea or loose stools.    [provider]  blood glucose meter kit and supplies KIT Dispense based on patient and insurance preference. Use up to four times daily as directed. 08/03/21   Antonieta Pert, MD  Blood Glucose Monitoring Suppl (ACCU-CHEK GUIDE) w/Device KIT Use as  directed to check blood sugar up to 4 times daily. 10/06/21   Shelly Coss, MD  carvedilol (COREG) 12.5 MG tablet Take 1 tablet (12.5 mg total) by mouth 2 (two) times daily with a meal. 10/06/21   Shelly Coss, MD  cephALEXin (KEFLEX) 500 MG capsule Take 1 capsule (500 mg total) by mouth 3 (three) times daily. 12/17/21   Horton, Barbette Hair, MD  diphenhydrAMINE (BENADRYL) 25 MG tablet Take 75 mg by mouth at bedtime.    [provider]  FLUoxetine (PROZAC) 40 MG capsule Take 1 capsule (40 mg total) by mouth daily. 07/01/20   Angiulli, Lavon Paganini, PA-C  levothyroxine (SYNTHROID) 50 MCG tablet Take 50 mcg by mouth daily.    [provider]  lisinopril (ZESTRIL) 10 MG tablet Take 1 tablet (10 mg total) by mouth daily. 10/06/21 10/06/22  Shelly Coss, MD  metFORMIN (GLUCOPHAGE) 500 MG tablet Take 500 mg by mouth 2 (two) times daily with a meal.    [provider]  multivitamin (RENA-VIT) TABS tablet Take 1 tablet by mouth at bedtime. 08/01/20   Jamse Arn, MD  ondansetron (ZOFRAN-ODT) 4 MG disintegrating tablet Take 1 tablet (4 mg total) by mouth every 6 (six) hours as needed for nausea or vomiting. 11/08/21   Willia Craze, NP  oxyCODONE-acetaminophen (PERCOCET) 7.5-325 MG tablet Take 1 tablet by mouth every 6 (six) hours as needed for moderate pain.    [provider]  pantoprazole (PROTONIX) 40 MG tablet Take 1 tablet (40 mg total) by mouth daily. 09/21/21 09/21/22  Elodia Florence., MD  prazosin (MINIPRESS) 2 MG capsule Take 1 capsule (2 mg total) by mouth at bedtime. 08/01/20   Jamse Arn, MD  promethazine (PHENERGAN) 25 MG suppository Place 1 suppository (25 mg total) rectally every 8 (eight) hours as needed for refractory nausea / vomiting. 11/08/21   Willia Craze, NP  promethazine (PHENERGAN) 25 MG tablet Take 1 tablet (25 mg total) by mouth every 6 (six) hours as needed for nausea or vomiting. 12/17/21   Horton, Barbette Hair, MD  tiZANidine  (ZANAFLEX) 4 MG tablet Take 3 tablets (12 mg total) by mouth every 8 (eight) hours as needed for muscle spasms. 12/16/21   Smoot, Leary Roca, PA-C  traZODone (DESYREL) 50 MG tablet Take 3 tablets (150 mg total) by mouth at bedtime. 09/21/21   Elodia Florence., MD      Allergies    Patient has no known allergies.    Review of Systems   Review of Systems  Constitutional:  Negative for fever.  HENT:  Negative for congestion and rhinorrhea.   Respiratory:  Positive for cough.   Gastrointestinal:  Positive for abdominal pain (suprapubic), nausea and vomiting.  Genitourinary:  Negative for dysuria, hematuria, vaginal bleeding and vaginal discharge.  All other systems reviewed and are negative.   Physical Exam Updated Vital Signs BP (!) 152/87   Pulse 89   Temp 98.5 F (36.9 C) (Oral)   Resp 18   Ht 5' 4"  (1.626 m)   Wt 88 kg   LMP 06/03/2018 (Approximate) Comment: neg hcg 05/10/20  SpO2 95%   BMI 33.30 kg/m  Physical Exam Vitals and nursing note reviewed.  Constitutional:      General: She is not in acute distress.    Appearance: She is not diaphoretic.  HENT:     Head: Normocephalic and atraumatic.     Mouth/Throat:     Pharynx: No oropharyngeal exudate.  Eyes:     General: No scleral icterus.    Conjunctiva/sclera: Conjunctivae normal.  Cardiovascular:     Rate and Rhythm: Normal rate and regular rhythm.     Pulses: Normal pulses.     Heart sounds: Normal heart sounds.  Pulmonary:     Effort: Pulmonary effort is normal. No respiratory distress.     Breath sounds: Normal breath sounds. No wheezing.  Abdominal:     General: Bowel sounds are normal.     Palpations: Abdomen is soft. There is no mass.     Tenderness: There is abdominal tenderness. There is no guarding or rebound.     Comments: Diffuse abdominal tenderness to palpation  Musculoskeletal:        General: Normal range of motion.     Cervical back: Normal range of motion and neck supple.  Skin:    General:  Skin is warm and dry.  Neurological:     Mental Status: She is alert.  Psychiatric:        Behavior: Behavior normal.     ED Results / Procedures / Treatments   Labs (all labs ordered are listed, but only abnormal results are displayed) Labs Reviewed  COMPREHENSIVE METABOLIC PANEL - Abnormal; Notable for the following components:      Result Value   Potassium 3.3 (*)    Glucose, Bld 134 (*)    BUN <5 (*)    Anion gap 16 (*)  All other components within normal limits  CBC - Abnormal; Notable for the following components:   Platelets 407 (*)    All other components within normal limits  LIPASE, BLOOD  URINALYSIS, ROUTINE W REFLEX MICROSCOPIC  I-STAT BETA HCG BLOOD, ED ([facility], [facility], [facility] ONLY)    EKG None  Radiology No results found.  Procedures Procedures    Medications Ordered in ED Medications  morphine (PF) 4 MG/ML injection 4 mg (has no administration in time range)  ondansetron (ZOFRAN) injection 4 mg (has no administration in time range)  sodium chloride 0.9 % bolus 1,000 mL (has no administration in time range)  promethazine (PHENERGAN) 25 mg in sodium chloride 0.9 % 50 mL IVPB (has no administration in time range)    ED Course/ Medical Decision Making/ A&P                           Medical Decision Making Amount and/or Complexity of Data Reviewed Radiology: ordered.  Risk Prescription drug management.   Patient presents to the emergency department with lower abdominal pain onset yesterday.  Has associated nausea and vomiting.  Has checked Phenergan and Zofran at home without relief of her symptoms.  Patient afebrile.  On exam patient with diffuse abdominal tenderness to palpation.  Remainder of exam without acute findings.  Differential diagnosis includes appendicitis, acute cystitis, viral etiology.   Additional history obtained:  Additional history obtained from Spouse/Significant Other External records from outside source obtained and reviewed  including: Patient has been admitted to the hospital multiple times for intractable nausea and vomiting this past year.  Had a recent EGD with Dr. Carlean Purl in June 2023  Labs:  I ordered, and personally interpreted labs.  The pertinent results include:  Lipase unremarkable at 20 CBC without leukocytosis CMP with slightly decreased potassium at 3.3 otherwise unremarkable  Imaging: I ordered imaging studies including CT abdomen pelvis ordered prior to signout.  Medications:  I ordered medication including morphine, phenergan, IVF for symptom management. I have reviewed the patients home medicines and have made adjustments as needed  Patient case discussed with Dr. Darl Householder, at sign-out. Plan at sign-out is pending labs, imaging, nausea control, likely Discharge home, however, plans may change as per oncoming team. Patient care transferred at sign out.    This chart was dictated using voice recognition software, Dragon. Despite the best efforts of this provider to proofread and correct errors, errors may still occur which can change documentation meaning.   Final Clinical Impression(s) / ED Diagnoses Final diagnoses:  Nausea and vomiting, unspecified vomiting type    Rx / DC Orders ED Discharge Orders     None         Jesilyn Easom A, PA-C 12/27/21 1856    Drenda Freeze, MD 12/27/21 2118

## 2021-12-27 NOTE — ED Notes (Signed)
RN reviewed discharge instructions with pt. Pt verbalized understanding and had no further questions. VSS upon discharge.  

## 2021-12-28 ENCOUNTER — Encounter (INDEPENDENT_AMBULATORY_CARE_PROVIDER_SITE_OTHER): Payer: Medicare Other | Admitting: Family Medicine

## 2022-02-02 ENCOUNTER — Emergency Department (HOSPITAL_COMMUNITY): Payer: Medicare Other

## 2022-02-02 ENCOUNTER — Other Ambulatory Visit: Payer: Self-pay

## 2022-02-02 ENCOUNTER — Inpatient Hospital Stay (HOSPITAL_COMMUNITY)
Admission: EM | Admit: 2022-02-02 | Discharge: 2022-02-05 | DRG: 184 | Disposition: A | Discharge status: Home / Self Care | Payer: Medicare Other | Attending: General Surgery | Admitting: General Surgery

## 2022-02-02 DIAGNOSIS — Z91138 Patient's unintentional underdosing of medication regimen for other reason: Secondary | ICD-10-CM

## 2022-02-02 DIAGNOSIS — Y92009 Unspecified place in unspecified non-institutional (private) residence as the place of occurrence of the external cause: Secondary | ICD-10-CM

## 2022-02-02 DIAGNOSIS — W1830XA Fall on same level, unspecified, initial encounter: Secondary | ICD-10-CM | POA: Diagnosis present

## 2022-02-02 DIAGNOSIS — F4310 Post-traumatic stress disorder, unspecified: Secondary | ICD-10-CM | POA: Diagnosis present

## 2022-02-02 DIAGNOSIS — M1990 Unspecified osteoarthritis, unspecified site: Secondary | ICD-10-CM | POA: Diagnosis present

## 2022-02-02 DIAGNOSIS — Z79890 Hormone replacement therapy (postmenopausal): Secondary | ICD-10-CM

## 2022-02-02 DIAGNOSIS — Z23 Encounter for immunization: Secondary | ICD-10-CM

## 2022-02-02 DIAGNOSIS — R112 Nausea with vomiting, unspecified: Secondary | ICD-10-CM | POA: Diagnosis present

## 2022-02-02 DIAGNOSIS — R42 Dizziness and giddiness: Secondary | ICD-10-CM | POA: Diagnosis present

## 2022-02-02 DIAGNOSIS — J9811 Atelectasis: Secondary | ICD-10-CM | POA: Diagnosis present

## 2022-02-02 DIAGNOSIS — Z7984 Long term (current) use of oral hypoglycemic drugs: Secondary | ICD-10-CM

## 2022-02-02 DIAGNOSIS — S2241XA Multiple fractures of ribs, right side, initial encounter for closed fracture: Principal | ICD-10-CM | POA: Diagnosis present

## 2022-02-02 DIAGNOSIS — I10 Essential (primary) hypertension: Secondary | ICD-10-CM | POA: Diagnosis present

## 2022-02-02 DIAGNOSIS — Z79899 Other long term (current) drug therapy: Secondary | ICD-10-CM

## 2022-02-02 DIAGNOSIS — Z803 Family history of malignant neoplasm of breast: Secondary | ICD-10-CM

## 2022-02-02 DIAGNOSIS — M549 Dorsalgia, unspecified: Secondary | ICD-10-CM | POA: Diagnosis present

## 2022-02-02 DIAGNOSIS — G8929 Other chronic pain: Secondary | ICD-10-CM | POA: Diagnosis present

## 2022-02-02 DIAGNOSIS — F32A Depression, unspecified: Secondary | ICD-10-CM | POA: Diagnosis present

## 2022-02-02 DIAGNOSIS — R059 Cough, unspecified: Secondary | ICD-10-CM | POA: Diagnosis present

## 2022-02-02 DIAGNOSIS — F419 Anxiety disorder, unspecified: Secondary | ICD-10-CM | POA: Diagnosis present

## 2022-02-02 DIAGNOSIS — S2249XA Multiple fractures of ribs, unspecified side, initial encounter for closed fracture: Secondary | ICD-10-CM | POA: Diagnosis present

## 2022-02-02 DIAGNOSIS — R000 Tachycardia, unspecified: Secondary | ICD-10-CM | POA: Diagnosis present

## 2022-02-02 DIAGNOSIS — R109 Unspecified abdominal pain: Secondary | ICD-10-CM | POA: Diagnosis present

## 2022-02-02 DIAGNOSIS — E785 Hyperlipidemia, unspecified: Secondary | ICD-10-CM | POA: Diagnosis present

## 2022-02-02 DIAGNOSIS — Z765 Malingerer [conscious simulation]: Secondary | ICD-10-CM

## 2022-02-02 DIAGNOSIS — K2270 Barrett's esophagus without dysplasia: Secondary | ICD-10-CM | POA: Diagnosis present

## 2022-02-02 LAB — CBC WITH DIFFERENTIAL/PLATELET
Abs Immature Granulocytes: 0.04 10*3/uL (ref 0.00–0.07)
Basophils Absolute: 0.1 10*3/uL (ref 0.0–0.1)
Basophils Relative: 1 %
Eosinophils Absolute: 0.4 10*3/uL (ref 0.0–0.5)
Eosinophils Relative: 5 %
HCT: 45.8 % (ref 36.0–46.0)
Hemoglobin: 14.9 g/dL (ref 12.0–15.0)
Immature Granulocytes: 0 %
Lymphocytes Relative: 28 %
Lymphs Abs: 2.6 10*3/uL (ref 0.7–4.0)
MCH: 30.2 pg (ref 26.0–34.0)
MCHC: 32.5 g/dL (ref 30.0–36.0)
MCV: 92.9 fL (ref 80.0–100.0)
Monocytes Absolute: 0.5 10*3/uL (ref 0.1–1.0)
Monocytes Relative: 6 %
Neutro Abs: 5.4 10*3/uL (ref 1.7–7.7)
Neutrophils Relative %: 60 %
Platelets: 373 10*3/uL (ref 150–400)
RBC: 4.93 MIL/uL (ref 3.87–5.11)
RDW: 12.8 % (ref 11.5–15.5)
WBC: 9 10*3/uL (ref 4.0–10.5)
nRBC: 0 % (ref 0.0–0.2)

## 2022-02-02 LAB — BASIC METABOLIC PANEL
Anion gap: 11 (ref 5–15)
BUN: 5 mg/dL — ABNORMAL LOW (ref 6–20)
CO2: 23 mmol/L (ref 22–32)
Calcium: 8.9 mg/dL (ref 8.9–10.3)
Chloride: 103 mmol/L (ref 98–111)
Creatinine, Ser: 0.99 mg/dL (ref 0.44–1.00)
GFR, Estimated: >60 mL/min (ref >60)
Glucose, Bld: 154 mg/dL — ABNORMAL HIGH (ref 70–99)
Potassium: 3.8 mmol/L (ref 3.5–5.1)
Sodium: 137 mmol/L (ref 135–145)

## 2022-02-02 MED ORDER — HYDROMORPHONE HCL 1 MG/ML IJ SOLN
1.0000 mg | Freq: Once | INTRAMUSCULAR | Status: AC
  Administered 2022-02-03: 1 mg via INTRAVENOUS
  Filled 2022-02-02: qty 1

## 2022-02-02 MED ORDER — PRAZOSIN HCL 2 MG PO CAPS
2.0000 mg | ORAL_CAPSULE | Freq: Once | ORAL | Status: AC
  Administered 2022-02-03: 2 mg via ORAL
  Filled 2022-02-02 (×2): qty 1

## 2022-02-02 MED ORDER — LACTATED RINGERS IV BOLUS
1000.0000 mL | Freq: Once | INTRAVENOUS | Status: AC
  Administered 2022-02-03: 1000 mL via INTRAVENOUS

## 2022-02-02 MED ORDER — ONDANSETRON HCL 4 MG/2ML IJ SOLN
4.0000 mg | Freq: Once | INTRAMUSCULAR | Status: AC
  Administered 2022-02-03: 4 mg via INTRAVENOUS
  Filled 2022-02-02: qty 2

## 2022-02-02 NOTE — ED Provider Notes (Signed)
11:26 PM Abnormal vitals noted by me while scanning results in the waiting room.  X-ray shows multiple rib fractures.  Charge RN notified for recheck of vitals.  CT c/a/p ordered.   Montine Circle, PA-C 02/02/22 2327    Merrily Pew, MD 02/02/22 [phone]

## 2022-02-02 NOTE — ED Provider Triage Note (Signed)
Emergency Medicine Provider Triage Evaluation Note  [name] , a 51 y.o. female  was evaluated in triage.  Pt complains of right-sided rib cage pain secondary to a fall.  Patient states she fell a few days ago while walking from her washer and dryer back to her bed, landing hard on the floor on her right side.  Patient also complains of some small markings to her left flank after the fall.  She denies hitting her head and denies loss of consciousness.  She was evaluated in urgent care who states that she had no broken ribs at that time.  She complains of continued pain in the right rib cage which worsens with coughing and deep breaths.  Review of Systems  Positive: As above Negative: As above  Physical Exam  BP (!) 124/100 (BP Location: Right Arm)   Pulse 77   Temp 97.7 F (36.5 C) (Oral)   Resp 18   LMP 06/03/2018 (Approximate) Comment: neg hcg 05/10/20  SpO2 98%  Gen:   Awake, no distress   Resp:  Normal effort  MSK:   Moves extremities without difficulty  Other:    Medical Decision Making  Medically screening exam initiated at 10:40 AM.  Appropriate orders placed.  [name] was informed that the remainder of the evaluation will be completed by another provider, this initial triage assessment does not replace that evaluation, and the importance of remaining in the ED until their evaluation is complete.     Dorothyann Peng, PA-C 02/02/22 1041

## 2022-02-02 NOTE — ED Provider Notes (Signed)
[facility] EMERGENCY DEPARTMENT Provider Note   CSN: [id number] Arrival date & time: 02/02/22  1006     History {Add pertinent medical, surgical, social history, OB history to HPI:1} Chief Complaint  Patient presents with   Fall   Flank Pain    [name] is a 51 y.o. female.  51 year old female who presents ER today for rib pain.  Patient felt dizzy the other day and then fell landing on her right side on a table.  Had pain in her right lateral chest.  Went to urgent care who did an x-ray as it was negative she has been at home and has continued with pain the last couple days so she presents here for further evaluation.  Decreased intake today.  Increased vomiting over the last 24 to 36 hours.  No fevers.  She also has little bit of abdominal pain.   Fall  Flank Pain       Home Medications Prior to Admission medications   Medication Sig Start Date End Date Taking? Authorizing Provider  acetaminophen (TYLENOL) 325 MG tablet Take 2 tablets (650 mg total) by mouth every 6 (six) hours as needed for mild pain, fever or headache. Patient taking differently: Take 650 mg by mouth every 6 (six) hours as needed for mild pain. 07/01/20   Angiulli, Lavon Paganini, PA-C  amLODipine (NORVASC) 10 MG tablet Take 1 tablet (10 mg total) by mouth daily. 10/07/21   Shelly Coss, MD  ARIPiprazole (ABILIFY) 5 MG tablet Take 1 tablet (5 mg total) by mouth daily. 08/01/20   Jamse Arn, MD  atorvastatin (LIPITOR) 10 MG tablet Take 10 mg by mouth daily. 08/26/20   [provider]  BELBUCA 600 MCG FILM Take 1 Film by mouth every 12 (twelve) hours. 12/09/21   [provider]  bismuth subsalicylate (PEPTO BISMOL) 262 MG chewable tablet Chew 524 mg by mouth daily as needed for diarrhea or loose stools.    [provider]  blood glucose meter kit and supplies KIT Dispense based on patient and insurance preference. Use up to four times daily as directed.  08/03/21   Antonieta Pert, MD  Blood Glucose Monitoring Suppl (ACCU-CHEK GUIDE) w/Device KIT Use as directed to check blood sugar up to 4 times daily. 10/06/21   Shelly Coss, MD  carvedilol (COREG) 12.5 MG tablet Take 1 tablet (12.5 mg total) by mouth 2 (two) times daily with a meal. 10/06/21   Shelly Coss, MD  cephALEXin (KEFLEX) 500 MG capsule Take 1 capsule (500 mg total) by mouth 3 (three) times daily. 12/17/21   Horton, Barbette Hair, MD  diphenhydrAMINE (BENADRYL) 25 MG tablet Take 75 mg by mouth at bedtime.    [provider]  FLUoxetine (PROZAC) 40 MG capsule Take 1 capsule (40 mg total) by mouth daily. 07/01/20   Angiulli, Lavon Paganini, PA-C  levothyroxine (SYNTHROID) 50 MCG tablet Take 50 mcg by mouth daily.    [provider]  lisinopril (ZESTRIL) 10 MG tablet Take 1 tablet (10 mg total) by mouth daily. 10/06/21 10/06/22  Shelly Coss, MD  metFORMIN (GLUCOPHAGE) 500 MG tablet Take 500 mg by mouth 2 (two) times daily with a meal.    [provider]  multivitamin (RENA-VIT) TABS tablet Take 1 tablet by mouth at bedtime. 08/01/20   Jamse Arn, MD  ondansetron (ZOFRAN-ODT) 4 MG disintegrating tablet Take 1 tablet (4 mg total) by mouth every 6 (six) hours as needed for nausea or vomiting. 11/08/21  Willia Craze, NP  oxyCODONE-acetaminophen (PERCOCET) 7.5-325 MG tablet Take 1 tablet by mouth every 6 (six) hours as needed for moderate pain.    [provider]  pantoprazole (PROTONIX) 40 MG tablet Take 1 tablet (40 mg total) by mouth daily. 09/21/21 09/21/22  Elodia Florence., MD  prazosin (MINIPRESS) 2 MG capsule Take 1 capsule (2 mg total) by mouth at bedtime. 08/01/20   Jamse Arn, MD  promethazine (PHENERGAN) 25 MG suppository Place 1 suppository (25 mg total) rectally every 8 (eight) hours as needed for refractory nausea / vomiting. 11/08/21   Willia Craze, NP  promethazine (PHENERGAN) 25 MG suppository Place 1 suppository (25 mg total)  rectally every 6 (six) hours as needed for nausea or vomiting. 12/27/21   Drenda Freeze, MD  tiZANidine (ZANAFLEX) 4 MG tablet Take 3 tablets (12 mg total) by mouth every 8 (eight) hours as needed for muscle spasms. 12/27/21   Drenda Freeze, MD  traZODone (DESYREL) 50 MG tablet Take 3 tablets (150 mg total) by mouth at bedtime. 09/21/21   Elodia Florence., MD      Allergies    Patient has no known allergies.    Review of Systems   Review of Systems  Genitourinary:  Positive for flank pain.    Physical Exam Updated Vital Signs BP (!) 156/112 (BP Location: Right Arm)   Pulse (!) 155   Temp 98.3 F (36.8 C) (Oral)   Resp 17   Ht 5' 4.5" (1.638 m)   Wt 89.4 kg   LMP 06/03/2018 (Approximate) Comment: neg hcg 05/10/20  SpO2 99%   BMI 33.29 kg/m  Physical Exam Vitals and nursing note reviewed.  Constitutional:      Appearance: She is well-developed.  HENT:     Head: Normocephalic and atraumatic.     Nose: No congestion or rhinorrhea.     Mouth/Throat:     Mouth: Mucous membranes are moist.     Pharynx: Oropharynx is clear.  Eyes:     Pupils: Pupils are equal, round, and reactive to light.  Cardiovascular:     Rate and Rhythm: Normal rate and regular rhythm.  Pulmonary:     Effort: No respiratory distress.     Breath sounds: No stridor.  Abdominal:     General: There is no distension.     Tenderness: There is abdominal tenderness (Right upper quadrant seems to be continuous with her rib fractures.).  Musculoskeletal:        General: Tenderness (Right lateral ribs) present.     Cervical back: Normal range of motion.  Skin:    General: Skin is warm and dry.  Neurological:     General: No focal deficit present.     Mental Status: She is alert.     ED Results / Procedures / Treatments   Labs (all labs ordered are listed, but only abnormal results are displayed) Labs Reviewed  BASIC METABOLIC PANEL - Abnormal; Notable for the following components:      Result  Value   Glucose, Bld 154 (*)    BUN 5 (*)    All other components within normal limits  CBC WITH DIFFERENTIAL/PLATELET    EKG None  Radiology DG Ribs Unilateral W/Chest Right  Result Date: 02/02/2022 CLINICAL DATA:  Fall, lateral lower rib cage pain. EXAM: RIGHT RIBS AND CHEST - 3+ VIEW COMPARISON:  09/18/2021 FINDINGS: There is a fracture noted through the lateral right 7th rib and 8th rib.  No associated pneumothorax. Right base atelectasis and possible small right effusion. Left lung clear. Heart is normal size. IMPRESSION: Right lateral 7th and 8th rib fractures with associated right base atelectasis and possible small right effusion. No pneumothorax. Electronically Signed   By: Rolm Baptise M.D.   On: 02/02/2022 11:15    Procedures Procedures  {Document cardiac monitor, telemetry assessment procedure when appropriate:1}  Medications Ordered in ED Medications - No data to display  ED Course/ Medical Decision Making/ A&P                           Medical Decision Making  CT C/A/P for intrathoracic injury. Also quite tachycardic but has had decreased p.o. intake today and has not taken any of her medicines tonight as she has been here for 13 hours.  We will start with some fluids, pain meds, nausea meds and a better chart review to see if she needs any beta-blockers or calcium channel blockers that she might be on at home. Reassess.   ***  {Document critical care time when appropriate:1} {Document review of labs and clinical decision tools ie heart score, Chads2Vasc2 etc:1}  {Document your independent review of radiology images, and any outside records:1} {Document your discussion with family members, caretakers, and with consultants:1} {Document social determinants of health affecting pt's care:1} {Document your decision making why or why not admission, treatments were needed:1} Final Clinical Impression(s) / ED Diagnoses Final diagnoses:  None    Rx / DC Orders ED  Discharge Orders     None

## 2022-02-02 NOTE — ED Notes (Signed)
Iv attempt x 2 by jeremy rn - unsuccessful

## 2022-02-02 NOTE — ED Triage Notes (Signed)
Pt. Stated, I fell a couple of days ago and went to UC and Im no better Im hurting on my rt. Side. Told if no better to come here.

## 2022-02-03 ENCOUNTER — Emergency Department (HOSPITAL_COMMUNITY): Payer: Medicare Other

## 2022-02-03 ENCOUNTER — Inpatient Hospital Stay (HOSPITAL_COMMUNITY): Payer: Medicare Other

## 2022-02-03 ENCOUNTER — Encounter (HOSPITAL_COMMUNITY): Payer: Self-pay

## 2022-02-03 DIAGNOSIS — M549 Dorsalgia, unspecified: Secondary | ICD-10-CM | POA: Diagnosis present

## 2022-02-03 DIAGNOSIS — R000 Tachycardia, unspecified: Secondary | ICD-10-CM | POA: Diagnosis present

## 2022-02-03 DIAGNOSIS — S2241XA Multiple fractures of ribs, right side, initial encounter for closed fracture: Secondary | ICD-10-CM | POA: Diagnosis present

## 2022-02-03 DIAGNOSIS — G8929 Other chronic pain: Secondary | ICD-10-CM | POA: Diagnosis present

## 2022-02-03 DIAGNOSIS — Y92009 Unspecified place in unspecified non-institutional (private) residence as the place of occurrence of the external cause: Secondary | ICD-10-CM | POA: Diagnosis not present

## 2022-02-03 DIAGNOSIS — Z7984 Long term (current) use of oral hypoglycemic drugs: Secondary | ICD-10-CM | POA: Diagnosis not present

## 2022-02-03 DIAGNOSIS — R059 Cough, unspecified: Secondary | ICD-10-CM | POA: Diagnosis present

## 2022-02-03 DIAGNOSIS — I10 Essential (primary) hypertension: Secondary | ICD-10-CM | POA: Diagnosis present

## 2022-02-03 DIAGNOSIS — R42 Dizziness and giddiness: Secondary | ICD-10-CM | POA: Diagnosis present

## 2022-02-03 DIAGNOSIS — Z803 Family history of malignant neoplasm of breast: Secondary | ICD-10-CM | POA: Diagnosis not present

## 2022-02-03 DIAGNOSIS — Z23 Encounter for immunization: Secondary | ICD-10-CM | POA: Diagnosis present

## 2022-02-03 DIAGNOSIS — W1830XA Fall on same level, unspecified, initial encounter: Secondary | ICD-10-CM | POA: Diagnosis present

## 2022-02-03 DIAGNOSIS — R109 Unspecified abdominal pain: Secondary | ICD-10-CM | POA: Diagnosis present

## 2022-02-03 DIAGNOSIS — Z91138 Patient's unintentional underdosing of medication regimen for other reason: Secondary | ICD-10-CM | POA: Diagnosis not present

## 2022-02-03 DIAGNOSIS — M1990 Unspecified osteoarthritis, unspecified site: Secondary | ICD-10-CM | POA: Diagnosis present

## 2022-02-03 DIAGNOSIS — S2249XA Multiple fractures of ribs, unspecified side, initial encounter for closed fracture: Secondary | ICD-10-CM | POA: Diagnosis present

## 2022-02-03 DIAGNOSIS — F4310 Post-traumatic stress disorder, unspecified: Secondary | ICD-10-CM | POA: Diagnosis present

## 2022-02-03 DIAGNOSIS — Z765 Malingerer [conscious simulation]: Secondary | ICD-10-CM | POA: Diagnosis not present

## 2022-02-03 DIAGNOSIS — K2270 Barrett's esophagus without dysplasia: Secondary | ICD-10-CM | POA: Diagnosis present

## 2022-02-03 DIAGNOSIS — F32A Depression, unspecified: Secondary | ICD-10-CM | POA: Diagnosis present

## 2022-02-03 DIAGNOSIS — F419 Anxiety disorder, unspecified: Secondary | ICD-10-CM | POA: Diagnosis present

## 2022-02-03 DIAGNOSIS — Z79899 Other long term (current) drug therapy: Secondary | ICD-10-CM | POA: Diagnosis not present

## 2022-02-03 DIAGNOSIS — J9811 Atelectasis: Secondary | ICD-10-CM | POA: Diagnosis present

## 2022-02-03 DIAGNOSIS — R112 Nausea with vomiting, unspecified: Secondary | ICD-10-CM | POA: Diagnosis present

## 2022-02-03 DIAGNOSIS — Z79890 Hormone replacement therapy (postmenopausal): Secondary | ICD-10-CM | POA: Diagnosis not present

## 2022-02-03 DIAGNOSIS — E785 Hyperlipidemia, unspecified: Secondary | ICD-10-CM | POA: Diagnosis present

## 2022-02-03 LAB — HEPATIC FUNCTION PANEL
ALT: 21 U/L (ref 0–44)
AST: 33 U/L (ref 15–41)
Albumin: 3.4 g/dL — ABNORMAL LOW (ref 3.5–5.0)
Alkaline Phosphatase: 111 U/L (ref 38–126)
Bilirubin, Direct: 0.2 mg/dL (ref 0.0–0.2)
Indirect Bilirubin: 0.6 mg/dL (ref 0.3–0.9)
Total Bilirubin: 0.8 mg/dL (ref 0.3–1.2)
Total Protein: 7.9 g/dL (ref 6.5–8.1)

## 2022-02-03 LAB — GLUCOSE, CAPILLARY
Glucose-Capillary: 114 mg/dL — ABNORMAL HIGH (ref 70–99)
Glucose-Capillary: 133 mg/dL — ABNORMAL HIGH (ref 70–99)

## 2022-02-03 MED ORDER — DOXYCYCLINE HYCLATE 100 MG PO TABS
100.0000 mg | ORAL_TABLET | Freq: Two times a day (BID) | ORAL | Status: AC
  Administered 2022-02-03 – 2022-02-04 (×4): 100 mg via ORAL
  Filled 2022-02-03 (×4): qty 1

## 2022-02-03 MED ORDER — OXYCODONE HCL 5 MG PO TABS
10.0000 mg | ORAL_TABLET | ORAL | Status: DC | PRN
  Administered 2022-02-03 – 2022-02-04 (×3): 10 mg via ORAL
  Filled 2022-02-03 (×2): qty 2

## 2022-02-03 MED ORDER — KETOROLAC TROMETHAMINE 30 MG/ML IJ SOLN
30.0000 mg | Freq: Once | INTRAMUSCULAR | Status: AC
  Administered 2022-02-03: 30 mg via INTRAVENOUS
  Filled 2022-02-03: qty 1

## 2022-02-03 MED ORDER — KETOROLAC TROMETHAMINE 15 MG/ML IJ SOLN
15.0000 mg | Freq: Three times a day (TID) | INTRAMUSCULAR | Status: DC
  Administered 2022-02-03 – 2022-02-05 (×7): 15 mg via INTRAVENOUS
  Filled 2022-02-03 (×8): qty 1

## 2022-02-03 MED ORDER — ENOXAPARIN SODIUM 30 MG/0.3ML IJ SOSY
30.0000 mg | PREFILLED_SYRINGE | Freq: Two times a day (BID) | INTRAMUSCULAR | Status: DC
  Administered 2022-02-04 – 2022-02-05 (×3): 30 mg via SUBCUTANEOUS
  Filled 2022-02-03 (×3): qty 0.3

## 2022-02-03 MED ORDER — INFLUENZA VAC SPLIT QUAD 0.5 ML IM SUSY
0.5000 mL | PREFILLED_SYRINGE | INTRAMUSCULAR | Status: AC
  Administered 2022-02-04: 0.5 mL via INTRAMUSCULAR
  Filled 2022-02-03: qty 0.5

## 2022-02-03 MED ORDER — IOHEXOL 350 MG/ML SOLN
75.0000 mL | Freq: Once | INTRAVENOUS | Status: AC | PRN
  Administered 2022-02-03: 75 mL via INTRAVENOUS

## 2022-02-03 MED ORDER — OXYCODONE HCL 5 MG PO TABS
5.0000 mg | ORAL_TABLET | ORAL | Status: DC | PRN
  Administered 2022-02-05 (×2): 5 mg via ORAL
  Filled 2022-02-03 (×5): qty 1

## 2022-02-03 MED ORDER — ACETAMINOPHEN 325 MG PO TABS
650.0000 mg | ORAL_TABLET | Freq: Four times a day (QID) | ORAL | Status: DC
  Administered 2022-02-03 – 2022-02-05 (×8): 650 mg via ORAL
  Filled 2022-02-03 (×8): qty 2

## 2022-02-03 MED ORDER — METOCLOPRAMIDE HCL 5 MG/ML IJ SOLN
10.0000 mg | Freq: Once | INTRAMUSCULAR | Status: AC
  Administered 2022-02-03: 10 mg via INTRAVENOUS
  Filled 2022-02-03: qty 2

## 2022-02-03 MED ORDER — DIPHENHYDRAMINE HCL 25 MG PO CAPS: 75.0000 mg | ORAL_CAPSULE | Freq: Every day | ORAL | Status: DC

## 2022-02-03 MED ORDER — ATORVASTATIN CALCIUM 10 MG PO TABS
10.0000 mg | ORAL_TABLET | Freq: Every day | ORAL | Status: DC
  Administered 2022-02-03 – 2022-02-05 (×3): 10 mg via ORAL
  Filled 2022-02-03 (×3): qty 1

## 2022-02-03 MED ORDER — LEVOTHYROXINE SODIUM 50 MCG PO TABS
50.0000 ug | ORAL_TABLET | Freq: Every day | ORAL | Status: DC
  Administered 2022-02-03 – 2022-02-05 (×3): 50 ug via ORAL
  Filled 2022-02-03: qty 2
  Filled 2022-02-03 (×2): qty 1

## 2022-02-03 MED ORDER — PRAZOSIN HCL 2 MG PO CAPS
2.0000 mg | ORAL_CAPSULE | Freq: Every day | ORAL | Status: DC
  Filled 2022-02-03 (×3): qty 1

## 2022-02-03 MED ORDER — BUPRENORPHINE HCL 2 MG SL SUBL
2.0000 mg | SUBLINGUAL_TABLET | Freq: Two times a day (BID) | SUBLINGUAL | Status: DC
  Administered 2022-02-03 – 2022-02-05 (×3): 2 mg via SUBLINGUAL
  Filled 2022-02-03 (×3): qty 1

## 2022-02-03 MED ORDER — TRAZODONE HCL 150 MG PO TABS
150.0000 mg | ORAL_TABLET | Freq: Every day | ORAL | Status: DC
  Administered 2022-02-05: 150 mg via ORAL
  Filled 2022-02-03: qty 1

## 2022-02-03 MED ORDER — LIDOCAINE 5 % EX PTCH
1.0000 | MEDICATED_PATCH | CUTANEOUS | Status: DC
  Administered 2022-02-03 – 2022-02-05 (×3): 1 via TRANSDERMAL
  Filled 2022-02-03 (×3): qty 1

## 2022-02-03 MED ORDER — METHOCARBAMOL 1000 MG/10ML IJ SOLN
500.0000 mg | Freq: Four times a day (QID) | INTRAVENOUS | Status: DC | PRN
  Filled 2022-02-03: qty 5

## 2022-02-03 MED ORDER — HYDROMORPHONE HCL 1 MG/ML IJ SOLN
1.0000 mg | Freq: Once | INTRAMUSCULAR | Status: AC
  Administered 2022-02-03: 1 mg via INTRAVENOUS
  Filled 2022-02-03: qty 1

## 2022-02-03 MED ORDER — PROCHLORPERAZINE EDISYLATE 10 MG/2ML IJ SOLN
10.0000 mg | INTRAMUSCULAR | Status: DC | PRN
  Administered 2022-02-03 (×2): 10 mg via INTRAVENOUS
  Filled 2022-02-03 (×2): qty 2

## 2022-02-03 MED ORDER — OXYCODONE-ACETAMINOPHEN 5-325 MG PO TABS
2.0000 | ORAL_TABLET | Freq: Once | ORAL | Status: AC
  Administered 2022-02-03: 2 via ORAL
  Filled 2022-02-03: qty 2

## 2022-02-03 MED ORDER — SIMETHICONE 80 MG PO CHEW: 80.0000 mg | CHEWABLE_TABLET | Freq: Four times a day (QID) | ORAL | Status: DC | PRN

## 2022-02-03 MED ORDER — GABAPENTIN 300 MG PO CAPS
300.0000 mg | ORAL_CAPSULE | Freq: Three times a day (TID) | ORAL | Status: DC
  Administered 2022-02-03 – 2022-02-05 (×7): 300 mg via ORAL
  Filled 2022-02-03 (×7): qty 1

## 2022-02-03 MED ORDER — PANTOPRAZOLE SODIUM 40 MG PO TBEC
40.0000 mg | DELAYED_RELEASE_TABLET | Freq: Every day | ORAL | Status: DC
  Administered 2022-02-03 – 2022-02-05 (×3): 40 mg via ORAL
  Filled 2022-02-03 (×3): qty 1

## 2022-02-03 MED ORDER — METOCLOPRAMIDE HCL 5 MG PO TABS
5.0000 mg | ORAL_TABLET | Freq: Two times a day (BID) | ORAL | Status: DC
  Administered 2022-02-03 – 2022-02-05 (×5): 5 mg via ORAL
  Filled 2022-02-03 (×6): qty 1

## 2022-02-03 MED ORDER — CARVEDILOL 12.5 MG PO TABS
12.5000 mg | ORAL_TABLET | Freq: Two times a day (BID) | ORAL | Status: DC
  Administered 2022-02-03 – 2022-02-05 (×4): 12.5 mg via ORAL
  Filled 2022-02-03 (×5): qty 1

## 2022-02-03 MED ORDER — FLUOXETINE HCL 20 MG PO CAPS
40.0000 mg | ORAL_CAPSULE | Freq: Every day | ORAL | Status: DC
  Administered 2022-02-03 – 2022-02-05 (×3): 40 mg via ORAL
  Filled 2022-02-03 (×3): qty 2

## 2022-02-03 MED ORDER — GABAPENTIN 600 MG PO TABS
300.0000 mg | ORAL_TABLET | Freq: Three times a day (TID) | ORAL | Status: DC
  Filled 2022-02-03: qty 0.5

## 2022-02-03 MED ORDER — SODIUM CHLORIDE 0.9 % IV BOLUS
1000.0000 mL | Freq: Once | INTRAVENOUS | Status: AC
  Administered 2022-02-03: 1000 mL via INTRAVENOUS

## 2022-02-03 MED ORDER — ONDANSETRON HCL 4 MG/2ML IJ SOLN
4.0000 mg | Freq: Four times a day (QID) | INTRAMUSCULAR | Status: DC | PRN
  Administered 2022-02-03 (×2): 4 mg via INTRAVENOUS
  Filled 2022-02-03 (×2): qty 2

## 2022-02-03 MED ORDER — NALOXONE HCL 0.4 MG/ML IJ SOLN
INTRAMUSCULAR | Status: AC
  Administered 2022-02-03: 0.4 mg
  Filled 2022-02-03: qty 1

## 2022-02-03 MED ORDER — BUPRENORPHINE HCL 600 MCG BU FILM: 600.0000 ug | ORAL_FILM | Freq: Two times a day (BID) | BUCCAL | Status: DC

## 2022-02-03 MED ORDER — TIZANIDINE HCL 2 MG PO TABS
12.0000 mg | ORAL_TABLET | Freq: Three times a day (TID) | ORAL | Status: DC | PRN
  Administered 2022-02-03 – 2022-02-05 (×3): 12 mg via ORAL
  Filled 2022-02-03 (×3): qty 6

## 2022-02-03 MED ORDER — DOCUSATE SODIUM 100 MG PO CAPS
100.0000 mg | ORAL_CAPSULE | Freq: Two times a day (BID) | ORAL | Status: DC
  Administered 2022-02-03 – 2022-02-05 (×5): 100 mg via ORAL
  Filled 2022-02-03 (×5): qty 1

## 2022-02-03 MED ORDER — LACTATED RINGERS IV BOLUS
1000.0000 mL | Freq: Once | INTRAVENOUS | Status: AC
  Administered 2022-02-03: 1000 mL via INTRAVENOUS

## 2022-02-03 NOTE — Significant Event (Signed)
Rapid Response Event Note   Reason for Call :  Decreased LOC and hypotension  Initial Focused Assessment:  This afternoon patient received Zanaflex, Zofran, Compazine, Neurontin and Oxycodone  She is now very drowsy and difficult to arouse. She is also hypotensive.   BP 65/40  HR 80s  RR 16  O2 sat 100 % on 2 L [place]  Temp 98.2  Lung sounds are clear, heart tones regular   Interventions:  0.'4mg'$  Narcan given IV x2 1L NS bolus PCXR done Placed on telemetry  She is now alert and oriented BP 107/75  Plan of Care:  RN to call if patient has additional somnolence or hypotension.     Event Summary:   MD Notified: Nadeen Landau, prior to my arrival Call Time: 1829 Arrival Time: [place] End Time: 1900  Raliegh Ip, RN

## 2022-02-03 NOTE — ED Notes (Signed)
Patient transported to CT 

## 2022-02-03 NOTE — Progress Notes (Signed)
Nurse tech called RN into room for low BP and patient hard to arose from sleep. BP 65/40 (48). Sternal rub given and patient woke with new BP 98/72 (79). After a minute patient's BP dropped again and while still lethargic. MD paged. Rapid called to bedside. Narcan 0.'8mg'$ , 1L bolus, and chest xray ordered. After narcan and bolus running new BP 107/75 (86). Telemetry and continuous pulse ox put on patient.

## 2022-02-03 NOTE — Plan of Care (Signed)
  Problem: Health Behavior/Discharge Planning: Goal: Ability to manage health-related needs will improve Outcome: Progressing   

## 2022-02-03 NOTE — Progress Notes (Signed)
   02/03/22 1514  Assess: MEWS Score  Temp 99 F (37.2 C)  BP (!) 156/90  MAP (mmHg) 111  Pulse Rate (!) 112  Resp 15  Level of Consciousness Alert  SpO2 95 %  O2 Device Room Air  Assess: MEWS Score  MEWS Temp 0  MEWS Systolic 0  MEWS Pulse 2  MEWS RR 0  MEWS LOC 0  MEWS Score 2  MEWS Score Color Yellow  Assess: if the MEWS score is Yellow or Red  Were vital signs taken at a resting state? Yes  Focused Assessment No change from prior assessment  Does the patient meet 2 or more of the SIRS criteria? No  MEWS guidelines implemented *See Row Information* Yes  Treat  MEWS Interventions Administered scheduled meds/treatments  Pain Scale 0-10  Pain Score 7  Pain Type Acute pain  Pain Location Rib cage  Pain Orientation Right  Pain Descriptors / Indicators Aching  Pain Frequency Constant  Pain Onset On-going  Pain Intervention(s) Medication (See eMAR)  Multiple Pain Sites No  Complains of Nausea /  Vomiting  Nausea relieved by Antiemetic  Patients response to intervention Relief  Take Vital Signs  Increase Vital Sign Frequency  Yellow: Q 2hr X 2 then Q 4hr X 2, if remains yellow, continue Q 4hrs  Escalate  MEWS: Escalate Yellow: discuss with charge nurse/RN and consider discussing with provider and RRT  Notify: Charge Nurse/RN  Name of Charge Nurse/RN Notified Sharron  Date Charge Nurse/RN Notified 02/03/22  Time Charge Nurse/RN Notified 0258  Notify: Provider  Provider Name/Title Dema Severin, Md  Date Provider Notified 02/03/22  Time Provider Notified 1733  Method of Notification Page  Notification Reason  (High HR. 120's.)  Assess: SIRS CRITERIA  SIRS Temperature  0  SIRS Pulse 1  SIRS Respirations  0  SIRS WBC 1  SIRS Score Sum  2

## 2022-02-03 NOTE — ED Notes (Signed)
IV team at bedside 

## 2022-02-03 NOTE — Evaluation (Signed)
Physical Therapy Evaluation Patient Details Name: [name] MRN: [id number] DOB: 11-29-1970 Today's Date: 02/03/2022  History of Present Illness  The pt is a 51 yo female presenting 10/13 reporting a fall on 10/11 resulting in R side pain. Imaging showed R rib 7-10 fx. PMH includes: anxiety, arthritis, chronic low back pain, drug-seeking behavior, HLD, HTN, PTSD, Bipolar disorder, ORIF of R ankle fx in 2021.   Clinical Impression  Pt in bed upon arrival of PT, agreeable to evaluation at this time. Prior to admission the pt was independent with mobility at home without use of DME, and reports independence with mobility and ADLs, but limited endurance. The pt now presents with limitations in functional mobility and activity tolerance, but will be abel to return home with intermittent support from family as needed when medically cleared. The pt required minA to complete bed mobility due to pain, but was then able to ambulate ~45 ft in hallway without overt LOB or need for assistance. Further assessment of stairs and dynamic balance limited by arrival of transport to take pt to new room, will continue to follow acutely to ensure full return to independence, but do not anticipate follow up PT needs after d/c.       Recommendations for follow up therapy are one component of a multi-disciplinary discharge planning process, led by the attending physician.  Recommendations may be updated based on patient status, additional functional criteria and insurance authorization.  Follow Up Recommendations No PT follow up      Assistance Recommended at Discharge Intermittent Supervision/Assistance  Patient can return home with the following  Assistance with cooking/housework;Help with stairs or ramp for entrance    Equipment Recommendations None recommended by PT  Recommendations for Other Services       Functional Status Assessment Patient has had a recent decline in their functional status and  demonstrates the ability to make significant improvements in function in a reasonable and predictable amount of time.     Precautions / Restrictions Precautions Precautions: Fall Precaution Comments: reports fall on 10/10 Restrictions Weight Bearing Restrictions: No      Mobility  Bed Mobility Overal bed mobility: Needs Assistance Bed Mobility: Rolling, Sidelying to Sit Rolling: Min guard Sidelying to sit: Min assist       General bed mobility comments: minA to elevate trunk due to pain in R ribs. pt able to manage LE    Transfers Overall transfer level: Needs assistance Equipment used: None Transfers: Sit to/from Stand Sit to Stand: Min guard           General transfer comment: minG for safety, pt with no overt LOB after initial stand    Ambulation/Gait Ambulation/Gait assistance: Min guard Gait Distance (Feet): 45 Feet Assistive device: None Gait Pattern/deviations: Step-through pattern, Decreased stride length Gait velocity: decreased Gait velocity interpretation: <1.31 ft/sec, indicative of household ambulator   General Gait Details: slow but generally steady, no overt LOB or need for DME     Balance Overall balance assessment: Mild deficits observed, not formally tested                                           Pertinent Vitals/Pain Pain Assessment Pain Assessment: Faces Faces Pain Scale: Hurts little more Pain Location: R ribs Pain Descriptors / Indicators: Discomfort Pain Intervention(s): Limited activity within patient's tolerance, Monitored during session, Repositioned    Home  Living Family/patient expects to be discharged to:: Private residence Living Arrangements: Spouse/significant other;Children (spouse and 2 adult children) Available Help at Discharge: Family;Available 24 hours/day Type of Home: House Home Access: Level entry       Home Layout: One level Home Equipment: Conservation officer, nature (2 wheels);Cane - single  point;Wheelchair - manual Additional Comments: pt reports full independence    Prior Function Prior Level of Function : Independent/Modified Independent (not working, reports no other falls)             Mobility Comments: pt reports no use of DME, stays at home, no falls ADLs Comments: pt reports full independence     Hand Dominance   Dominant Hand: Right    Extremity/Trunk Assessment   Upper Extremity Assessment Upper Extremity Assessment: Overall WFL for tasks assessed    Lower Extremity Assessment Lower Extremity Assessment: Overall WFL for tasks assessed    Cervical / Trunk Assessment Cervical / Trunk Assessment: Normal;Other exceptions Cervical / Trunk Exceptions: pain from R rib fx  Communication   Communication: No difficulties  Cognition Arousal/Alertness: Awake/alert Behavior During Therapy: Flat affect Overall Cognitive Status: Within Functional Limits for tasks assessed                                 General Comments: pt with flat affect and appears tearful, able to answer all questions appropriately and in agreement with past charting. Not formally tested        General Comments General comments (skin integrity, edema, etc.): limited by arrival of transport to take pt from ED to new room.    Exercises     Assessment/Plan    PT Assessment Patient needs continued PT services  PT Problem List Decreased activity tolerance;Decreased balance;Cardiopulmonary status limiting activity       PT Treatment Interventions DME instruction;Gait training;Stair training;Functional mobility training;Therapeutic activities;Therapeutic exercise;Balance training;Patient/family education    PT Goals (Current goals can be found in the Care Plan section)  Acute Rehab PT Goals Patient Stated Goal: return home, feel less nauseous PT Goal Formulation: With patient Time For Goal Achievement: 02/17/22 Potential to Achieve Goals: Good    Frequency Min  3X/week        AM-PAC PT "6 Clicks" Mobility  Outcome Measure Help needed turning from your back to your side while in a flat bed without using bedrails?: A Little Help needed moving from lying on your back to sitting on the side of a flat bed without using bedrails?: A Little Help needed moving to and from a bed to a chair (including a wheelchair)?: A Little Help needed standing up from a chair using your arms (e.g., wheelchair or bedside chair)?: A Little Help needed to walk in hospital room?: A Little Help needed climbing 3-5 steps with a railing? : A Little 6 Click Score: 18    End of Session   Activity Tolerance: Patient tolerated treatment well Patient left: in chair (transport chair with transport) Nurse Communication: Mobility status PT Visit Diagnosis: Other abnormalities of gait and mobility (R26.89);Muscle weakness (generalized) (M62.81)    Time: 1441-1450 PT Time Calculation (min) (ACUTE ONLY): 9 min   Charges:   PT Evaluation $PT Eval Low Complexity: 1 Low          West Carbo, PT, DPT   Acute Rehabilitation Department  Sandra Cockayne 02/03/2022, 3:05 PM

## 2022-02-03 NOTE — ED Notes (Signed)
This RN has called Ct to determine what the delay is; was infomed "there are a few ahead of her and we are waiting on 2 traumas."

## 2022-02-03 NOTE — H&P (Signed)
Admitting Physician: Henry  Service: Trauma Surgery  CC: Fall  Subjective   Mechanism of Injury: [name] is an 51 y.o. female who presented as a trauma consult after a fall a few days ago.  Severe right sided chest pain.  Nausea and vomiting.  Pain getting worse so she came into ER.  Past Medical History:  Diagnosis Date   Anxiety    Arthritis    "joints ache all over" (10/15/2014)   Barrett's esophagus    Bulging lumbar disc    Chronic lower back pain    DDD (degenerative disc disease), cervical    Depression    Drug-seeking behavior    Headache    "weekly" (10/15/2014)   Hyperlipemia    Hypertension    PTSD (post-traumatic stress disorder)    Skin cancer    "had them cut off my arms; don't know what kind"    Past Surgical History:  Procedure Laterality Date   ABLATION ON ENDOMETRIOSIS  2008   BIOPSY  12/27/2018   Procedure: BIOPSY;  Surgeon: Thornton Park, MD;  Location: [facility];  Service: Gastroenterology;;   BIOPSY  10/05/2021   Procedure: BIOPSY;  Surgeon: Gatha Mayer, MD;  Location: [facility];  Service: Gastroenterology;;   ESOPHAGOGASTRODUODENOSCOPY (EGD) WITH PROPOFOL N/A 12/27/2018   Procedure: ESOPHAGOGASTRODUODENOSCOPY (EGD) WITH PROPOFOL;  Surgeon: Thornton Park, MD;  Location: [facility];  Service: Gastroenterology;  Laterality: N/A;   ESOPHAGOGASTRODUODENOSCOPY (EGD) WITH PROPOFOL N/A 10/05/2021   Procedure: ESOPHAGOGASTRODUODENOSCOPY (EGD) WITH PROPOFOL;  Surgeon: Gatha Mayer, MD;  Location: [place];  Service: Gastroenterology;  Laterality: N/A;   HEMORRHOID SURGERY  ~ 2002   IR FLUORO GUIDE CV LINE RIGHT  06/14/2020   IR REMOVAL TUN CV CATH W/O FL  06/23/2020   IR US GUIDE VASC ACCESS RIGHT  06/14/2020   ORIF ANKLE FRACTURE Right 03/28/2020   Procedure: OPEN REDUCTION INTERNAL FIXATION (ORIF) RIGHT BIMALLEOLAR ANKLE FRACTURE;  Surgeon: Marchia Bond, MD;  Location: [place];  Service:  Orthopedics;  Laterality: Right;    Family History  Problem Relation Age of Onset   Breast cancer Mother    Diabetes Mother    Breast cancer Maternal Grandmother    Breast cancer Paternal Grandmother    Colon polyps Paternal Grandmother    Colon cancer Neg Hx    Esophageal cancer Neg Hx    Rectal cancer Neg Hx    Stomach cancer Neg Hx     Social:  reports that she has never smoked. She has never used smokeless tobacco. She reports that she does not currently use alcohol. She reports that she does not use drugs.  Allergies: No Known Allergies  Medications: Current Outpatient Medications  Medication Instructions   acetaminophen (TYLENOL) 650 mg, Oral, Every 6 hours PRN   amLODipine (NORVASC) 10 mg, Oral, Daily   ARIPiprazole (ABILIFY) 5 mg, Oral, Daily   atorvastatin (LIPITOR) 10 mg, Oral, Daily   Belbuca 600 mcg, Oral, Every 12 hours   blood glucose meter kit and supplies KIT Dispense based on patient and insurance preference. Use up to four times daily as directed.   Blood Glucose Monitoring Suppl (ACCU-CHEK GUIDE) w/Device KIT Use as directed to check blood sugar up to 4 times daily.   carvedilol (COREG) 12.5 mg, Oral, 2 times daily with meals   diphenhydrAMINE (BENADRYL) 75 mg, Oral, Daily at bedtime   doxycycline (VIBRA-TABS) 100 mg, Oral, 2 times daily   FLUoxetine (PROZAC) 40 mg, Oral,  Daily   levothyroxine (SYNTHROID) 50 mcg, Oral, Daily   lisinopril (ZESTRIL) 10 mg, Oral, Daily   metFORMIN (GLUCOPHAGE) 500 mg, Oral, 2 times daily with meals   metoCLOPramide (REGLAN) 5 mg, Oral, 2 times daily   multivitamin (RENA-VIT) TABS tablet 1 tablet, Oral, Daily at bedtime   ondansetron (ZOFRAN-ODT) 4 mg, Oral, Every 6 hours PRN   oxyCODONE-acetaminophen (PERCOCET) 7.5-325 MG tablet 1 tablet, Every 6 hours PRN   pantoprazole (PROTONIX) 40 mg, Oral, Daily   prazosin (MINIPRESS) 2 mg, Oral, Daily at bedtime   promethazine (PHENERGAN) 25 mg, Rectal, Every 6 hours PRN   tiZANidine  (ZANAFLEX) 12 mg, Oral, Every 8 hours PRN   traZODone (DESYREL) 150 mg, Oral, Daily at bedtime    Objective   Primary Survey: Blood pressure (!) 179/103, pulse (!) 136, temperature 99.4 F (37.4 C), temperature source Temporal, resp. rate 10, height 5' 4.5" (1.638 m), weight 89.4 kg, last menstrual period 06/03/2018, SpO2 95 %. Airway: Patent, protecting airway Breathing: Bilateral breath sounds, breathing spontaneously Circulation: Stable, Palpable peripheral pulses Disability: Moving all extremities,   GCS Eyes: 4 - Eyes open spontaneously  GCS Verbal: 5 - Oriented  GCS Motor: 6 - Obeys commands for movement  GCS 15  Environment/Exposure: Warm, dry   Secondary Survey: Head: Normocephalic, atraumatic Neck: Full range of motion without pain, no midline tenderness Chest: Bilateral breath sounds, chest wall stable R sided tenderness Abdomen: Soft, non-tender, non-distended Upper Extremities: Strength and sensation intact, palpable peripheral pulses Lower extremities: Strength and sensation intact, palpable peripheral pulses Back: No step offs or deformities, atraumatic Rectal: Tone intact, no blood in rectal vault on digital rectal exam Psych: Normal mood and affect    Results for orders placed or performed during the hospital encounter of 02/02/22 (from the past 24 hour(s))  Basic metabolic panel     Status: Abnormal   Collection Time: 02/02/22 10:53 AM  Result Value Ref Range   Sodium 137 135 - 145 mmol/L   Potassium 3.8 3.5 - 5.1 mmol/L   Chloride 103 98 - 111 mmol/L   CO2 23 22 - 32 mmol/L   Glucose, Bld 154 (H) 70 - 99 mg/dL   BUN 5 (L) 6 - 20 mg/dL   Creatinine, Ser 0.99 0.44 - 1.00 mg/dL   Calcium 8.9 8.9 - 10.3 mg/dL   GFR, Estimated >60 >60 mL/min   Anion gap 11 5 - 15  CBC with Differential     Status: None   Collection Time: 02/02/22 10:53 AM  Result Value Ref Range   WBC 9.0 4.0 - 10.5 K/uL   RBC 4.93 3.87 - 5.11 MIL/uL   Hemoglobin 14.9 12.0 - 15.0 g/dL    HCT 45.8 36.0 - 46.0 %   MCV 92.9 80.0 - 100.0 fL   MCH 30.2 26.0 - 34.0 pg   MCHC 32.5 30.0 - 36.0 g/dL   RDW 12.8 11.5 - 15.5 %   Platelets 373 150 - 400 K/uL   nRBC 0.0 0.0 - 0.2 %   Neutrophils Relative % 60 %   Neutro Abs 5.4 1.7 - 7.7 K/uL   Lymphocytes Relative 28 %   Lymphs Abs 2.6 0.7 - 4.0 K/uL   Monocytes Relative 6 %   Monocytes Absolute 0.5 0.1 - 1.0 K/uL   Eosinophils Relative 5 %   Eosinophils Absolute 0.4 0.0 - 0.5 K/uL   Basophils Relative 1 %   Basophils Absolute 0.1 0.0 - 0.1 K/uL   Immature Granulocytes 0 %  Abs Immature Granulocytes 0.04 0.00 - 0.07 K/uL  Hepatic function panel     Status: Abnormal   Collection Time: 02/03/22 12:38 AM  Result Value Ref Range   Total Protein 7.9 6.5 - 8.1 g/dL   Albumin 3.4 (L) 3.5 - 5.0 g/dL   AST 33 15 - 41 U/L   ALT 21 0 - 44 U/L   Alkaline Phosphatase 111 38 - 126 U/L   Total Bilirubin 0.8 0.3 - 1.2 mg/dL   Bilirubin, Direct 0.2 0.0 - 0.2 mg/dL   Indirect Bilirubin 0.6 0.3 - 0.9 mg/dL     Imaging Orders         DG Ribs Unilateral W/Chest Right         CT CHEST ABDOMEN PELVIS W CONTRAST      Assessment and Plan   SARA KEYS is an 51 y.o. female who presented as a trauma consult after a fall.  Injuries: R 7-10 rib fractures - Pain control (difficult with narcotic history), PT/OT, pulmonary toilet  FEN - Reg VTE - Lovenox and Sequential Compression Devices ID - none  Dispo - Med-Surg Floor    Felicie Morn, MD  [facility] Use [email or web address] to contact on call provider  New Patient Billing: [phone] - Moderate MDM

## 2022-02-03 NOTE — ED Notes (Signed)
Report given and care endorsed to [facility]

## 2022-02-03 NOTE — ED Notes (Signed)
Dr. Dayna Barker at bedside to attempt US guided IV

## 2022-02-03 NOTE — ED Notes (Signed)
Pt denies nausea/vomiting at this time.

## 2022-02-03 NOTE — ED Notes (Signed)
Pt resting on stretcher and placed on monitoring. Pt reports taking medication at a pain clinic for her back daily. Pt vomiting intermittently and c/o nausea.

## 2022-02-03 NOTE — ED Notes (Signed)
Pt assisted to stand at bedside. Linens and gown changed. Pt assisted back onto stretcher and provided warm blanket.

## 2022-02-04 ENCOUNTER — Inpatient Hospital Stay (HOSPITAL_COMMUNITY): Payer: Medicare Other

## 2022-02-04 LAB — BASIC METABOLIC PANEL
Anion gap: 10 (ref 5–15)
BUN: 6 mg/dL (ref 6–20)
CO2: 26 mmol/L (ref 22–32)
Calcium: 8.1 mg/dL — ABNORMAL LOW (ref 8.9–10.3)
Chloride: 105 mmol/L (ref 98–111)
Creatinine, Ser: 1.06 mg/dL — ABNORMAL HIGH (ref 0.44–1.00)
GFR, Estimated: >60 mL/min (ref >60)
Glucose, Bld: 124 mg/dL — ABNORMAL HIGH (ref 70–99)
Potassium: 3.2 mmol/L — ABNORMAL LOW (ref 3.5–5.1)
Sodium: 141 mmol/L (ref 135–145)

## 2022-02-04 LAB — CBC
HCT: 37.1 % (ref 36.0–46.0)
Hemoglobin: 12.4 g/dL (ref 12.0–15.0)
MCH: 30.8 pg (ref 26.0–34.0)
MCHC: 33.4 g/dL (ref 30.0–36.0)
MCV: 92.3 fL (ref 80.0–100.0)
Platelets: 253 10*3/uL (ref 150–400)
RBC: 4.02 MIL/uL (ref 3.87–5.11)
RDW: 13.3 % (ref 11.5–15.5)
WBC: 13.4 10*3/uL — ABNORMAL HIGH (ref 4.0–10.5)
nRBC: 0 % (ref 0.0–0.2)

## 2022-02-04 NOTE — Progress Notes (Signed)
Patient ID: [name], female   DOB: 1971-04-17, 51 y.o.   MRN: [id number] [facility] Surgery Progress Note:   * No surgery found *   THE PLAN  Repeat CXR today; probable discharge tomorrow  Subjective: Mental status is clear.  Complaints breathing is much better. Objective: Vital signs in last 24 hours: Temp:  [98 F (36.7 C)-99 F (37.2 C)] 98.4 F (36.9 C) (10/15 0837) Pulse Rate:  [80-132] 109 (10/15 0837) Resp:  [8-17] 17 (10/15 0837) BP: (98-166)/(63-137) 156/131 (10/15 0837) SpO2:  [92 %-100 %] 99 % (10/15 0837)  Intake/Output from previous day: 10/14 0701 - 10/15 0700 In: 2281 [P.O.:200; IV Piggyback:2081] Out: 600 [Urine:600] Intake/Output this shift: No intake/output data recorded.  Physical Exam: Work of breathing is not labored.  BS are equal bilaterally.  Lab Results:  Results for orders placed or performed during the hospital encounter of 02/02/22 (from the past 48 hour(s))  Basic metabolic panel     Status: Abnormal   Collection Time: 02/02/22 10:53 AM  Result Value Ref Range   Sodium 137 135 - 145 mmol/L   Potassium 3.8 3.5 - 5.1 mmol/L   Chloride 103 98 - 111 mmol/L   CO2 23 22 - 32 mmol/L   Glucose, Bld 154 (H) 70 - 99 mg/dL    Comment: Glucose reference range applies only to samples taken after fasting for at least 8 hours.   BUN 5 (L) 6 - 20 mg/dL   Creatinine, Ser 0.99 0.44 - 1.00 mg/dL   Calcium 8.9 8.9 - 10.3 mg/dL   GFR, Estimated >60 >60 mL/min    Comment: (NOTE) Calculated using the CKD-EPI Creatinine Equation (2021)    Anion gap 11 5 - 15    Comment: Performed at [facility] [address], [place], [place] [address]  CBC with Differential     Status: None   Collection Time: 02/02/22 10:53 AM  Result Value Ref Range   WBC 9.0 4.0 - 10.5 K/uL   RBC 4.93 3.87 - 5.11 MIL/uL   Hemoglobin 14.9 12.0 - 15.0 g/dL   HCT 45.8 36.0 - 46.0 %   MCV 92.9 80.0 - 100.0 fL   MCH 30.2 26.0 - 34.0 pg   MCHC 32.5 30.0 - 36.0 g/dL    RDW 12.8 11.5 - 15.5 %   Platelets 373 150 - 400 K/uL   nRBC 0.0 0.0 - 0.2 %   Neutrophils Relative % 60 %   Neutro Abs 5.4 1.7 - 7.7 K/uL   Lymphocytes Relative 28 %   Lymphs Abs 2.6 0.7 - 4.0 K/uL   Monocytes Relative 6 %   Monocytes Absolute 0.5 0.1 - 1.0 K/uL   Eosinophils Relative 5 %   Eosinophils Absolute 0.4 0.0 - 0.5 K/uL   Basophils Relative 1 %   Basophils Absolute 0.1 0.0 - 0.1 K/uL   Immature Granulocytes 0 %   Abs Immature Granulocytes 0.04 0.00 - 0.07 K/uL    Comment: Performed at [facility], [address], [place], [place] [address]  Hepatic function panel     Status: Abnormal   Collection Time: 02/03/22 12:38 AM  Result Value Ref Range   Total Protein 7.9 6.5 - 8.1 g/dL   Albumin 3.4 (L) 3.5 - 5.0 g/dL   AST 33 15 - 41 U/L   ALT 21 0 - 44 U/L   Alkaline Phosphatase 111 38 - 126 U/L   Total Bilirubin 0.8 0.3 - 1.2 mg/dL  Bilirubin, Direct 0.2 0.0 - 0.2 mg/dL   Indirect Bilirubin 0.6 0.3 - 0.9 mg/dL    Comment: Performed at [facility] [address] [place], [place] [address]  Glucose, capillary     Status: Abnormal   Collection Time: 02/03/22  3:26 PM  Result Value Ref Range   Glucose-Capillary 114 (H) 70 - 99 mg/dL    Comment: Glucose reference range applies only to samples taken after fasting for at least 8 hours.  Glucose, capillary     Status: Abnormal   Collection Time: 02/03/22  5:51 PM  Result Value Ref Range   Glucose-Capillary 133 (H) 70 - 99 mg/dL    Comment: Glucose reference range applies only to samples taken after fasting for at least 8 hours.  Basic metabolic panel     Status: Abnormal   Collection Time: 02/04/22  3:43 AM  Result Value Ref Range   Sodium 141 135 - 145 mmol/L   Potassium 3.2 (L) 3.5 - 5.1 mmol/L   Chloride 105 98 - 111 mmol/L   CO2 26 22 - 32 mmol/L   Glucose, Bld 124 (H) 70 - 99 mg/dL    Comment: Glucose reference range applies only to samples taken after fasting for at least 8 hours.   BUN 6 6 - 20  mg/dL   Creatinine, Ser 1.06 (H) 0.44 - 1.00 mg/dL   Calcium 8.1 (L) 8.9 - 10.3 mg/dL   GFR, Estimated >60 >60 mL/min    Comment: (NOTE) Calculated using the CKD-EPI Creatinine Equation (2021)    Anion gap 10 5 - 15    Comment: Performed at [facility] [address], [place], [place] [address]  CBC     Status: Abnormal   Collection Time: 02/04/22  3:43 AM  Result Value Ref Range   WBC 13.4 (H) 4.0 - 10.5 K/uL   RBC 4.02 3.87 - 5.11 MIL/uL   Hemoglobin 12.4 12.0 - 15.0 g/dL   HCT 37.1 36.0 - 46.0 %   MCV 92.3 80.0 - 100.0 fL   MCH 30.8 26.0 - 34.0 pg   MCHC 33.4 30.0 - 36.0 g/dL   RDW 13.3 11.5 - 15.5 %   Platelets 253 150 - 400 K/uL   nRBC 0.0 0.0 - 0.2 %    Comment: Performed at [facility], [place] [address], [place], [place] [address]    Radiology/Results: DG CHEST PORT 1 VIEW  Result Date: 02/03/2022 CLINICAL DATA:  Shortness of breath EXAM: PORTABLE CHEST 1 VIEW COMPARISON:  02/02/2022 FINDINGS: Heart is borderline in size. Bibasilar linear opacities, likely atelectasis. Mild elevation of the right hemidiaphragm. No overt edema. No effusions or acute bony abnormality. IMPRESSION: Bibasilar atelectasis. Electronically Signed   By: Rolm Baptise M.D.   On: 02/03/2022 19:07   CT CHEST ABDOMEN PELVIS W CONTRAST  Result Date: 02/03/2022 CLINICAL DATA:  Polytrauma, blunt. Fall a couple of days ago with right-sided pain. EXAM: CT CHEST, ABDOMEN, AND PELVIS WITH CONTRAST TECHNIQUE: Multidetector CT imaging of the chest, abdomen and pelvis was performed following the standard protocol during bolus administration of intravenous contrast. RADIATION DOSE REDUCTION: This exam was performed according to the departmental dose-optimization program which includes automated exposure control, adjustment of the mA and/or kV according to patient size and/or use of iterative reconstruction technique. CONTRAST:  58m OMNIPAQUE IOHEXOL 350 MG/ML SOLN COMPARISON:  12/27/2021. FINDINGS: CT  CHEST FINDINGS Cardiovascular: The heart is normal in size and there is a trace pericardial effusion.  The aorta and pulmonary trunk are normal in caliber. Mediastinum/Nodes: No enlarged mediastinal, hilar, or axillary lymph nodes. Thyroid gland, trachea, and esophagus demonstrate no significant findings. Lungs/Pleura: Strandy atelectasis is present in the right upper lobe and right lower lobe. No effusion or pneumothorax. Musculoskeletal: There is bony deformity of the T7, T8, T9, and T10 ribs on the right which are new from the previous exam, suggesting nondisplaced fractures. The remaining bony structures are intact. CT ABDOMEN PELVIS FINDINGS Hepatobiliary: Hepatic steatosis is noted. No hepatic injury or perihepatic hematoma. Gallbladder is unremarkable. Pancreas: Unremarkable. No pancreatic ductal dilatation or surrounding inflammatory changes. Spleen: No splenic injury or perisplenic hematoma. Adrenals/Urinary Tract: The adrenal glands are within normal limits. The kidneys enhance symmetrically. No renal calculus or hydronephrosis. The bladder is unremarkable. Stomach/Bowel: There is a small hiatal hernia of the left diaphragm containing a portion of the stomach. Stomach is otherwise within normal limits. Appendix appears normal. No evidence of bowel wall thickening, distention, or inflammatory changes. No free air or pneumatosis. Vascular/Lymphatic: Aortic atherosclerosis. No enlarged abdominal or pelvic lymph nodes. Reproductive: Uterus and bilateral adnexa are unremarkable. Other: No abdominopelvic ascites. Small fat containing umbilical hernia and bilateral fat containing inguinal hernias. Musculoskeletal: An old healed fracture deformity is noted at the inferior pubic ramus on the right. No acute osseous abnormality. Mild degenerative changes in the lumbar spine. IMPRESSION: 1. Nondisplaced fractures of the T7 through T10 ribs on the right. 2. Mild atelectasis in the right upper and lower lobes. 3. No  evidence of solid organ injury. 4. Hepatic steatosis. Electronically Signed   By: Brett Fairy M.D.   On: 02/03/2022 03:34   DG Ribs Unilateral W/Chest Right  Result Date: 02/02/2022 CLINICAL DATA:  Fall, lateral lower rib cage pain. EXAM: RIGHT RIBS AND CHEST - 3+ VIEW COMPARISON:  09/18/2021 FINDINGS: There is a fracture noted through the lateral right 7th rib and 8th rib. No associated pneumothorax. Right base atelectasis and possible small right effusion. Left lung clear. Heart is normal size. IMPRESSION: Right lateral 7th and 8th rib fractures with associated right base atelectasis and possible small right effusion. No pneumothorax. Electronically Signed   By: Rolm Baptise M.D.   On: 02/02/2022 11:15    Anti-infectives: Anti-infectives (From admission, onward)    Start     Dose/Rate Route Frequency Ordered Stop   02/03/22 1000  doxycycline (VIBRA-TABS) tablet 100 mg        100 mg Oral 2 times daily 02/03/22 0650 02/05/22 0959       Assessment/Plan: Problem List: Patient Active Problem List   Diagnosis Date Noted   Multiple rib fractures 02/03/2022   High anion gap metabolic acidosis 53/29/9242   Acute encephalopathy 10/14/2021   Hypernatremia 09/20/2021   Barrett's esophagus    Cyclical vomiting syndrome 09/18/2021   Chronic pain disorder 09/18/2021   Class 2 obesity due to excess calories with body mass index (BMI) of 37.0 to 37.9 in adult 09/18/2021   Hypothyroidism 09/18/2021   Starvation ketoacidosis 08/27/2021   Viral gastroenteritis 08/27/2021   Intractable vomiting 08/02/2021   Aortic atherosclerosis ([place]) 08/02/2021   LFT elevation 08/02/2021   Hyperbilirubinemia 08/02/2021   Hypoalbuminemia 08/02/2021   T2DM (type 2 diabetes mellitus) ([place]) 08/02/2021   Hyperlipidemia 11/11/2020   Anoxic brain damage ([place]) 08/16/2020   History of depression    Sleep disturbance    Anxiety state    N&V (nausea and vomiting)    Substance abuse (HCC)    Anemia of chronic  disease    Acute metabolic encephalopathy 48/18/5631   Hematochezia    Acute blood loss anemia    Acute respiratory failure with hypoxia (HCC)    Non-traumatic rhabdomyolysis    Cardiac arrest ([place]) 06/08/2020   AKI (acute kidney injury) ([place]) 05/10/2020   Fracture of ankle, bimalleolar, right, closed 03/28/2020   Iron deficiency 05/05/2019   Symptomatic anemia 05/05/2019   Nausea & vomiting 12/26/2018   Essential hypertension 12/26/2018   ARF (acute renal failure) ([place]) 12/26/2018   Intractable nausea and vomiting 11/28/2018   Bipolar affective disorder ([place]) 11/28/2018   Gastroenteritis 11/19/2015   Hypomagnesemia    C. difficile colitis    SIRS (systemic inflammatory response syndrome) ([place]) 01/11/2015   Cellulitis 01/03/2015   Opiate withdrawal ([place]) 12/18/2014   Depression with anxiety 12/17/2014   Herpes simplex virus type 1 (HSV-1) dermatitis    Sacral fracture ([place]) 12/12/2014   Leukocytosis 12/12/2014   Chronic lower back pain 12/12/2014   Sacral fracture, closed ([place]) 12/12/2014   Nausea with vomiting    Intractable abdominal pain 10/15/2014   Abdominal pain 10/15/2014   Hypokalemia 09/04/2014   Sepsis ([place]) 09/01/2014   Abdominal pain, generalized 09/01/2014   PTSD (post-traumatic stress disorder) 09/01/2014   Endometriosis 09/01/2014   Sinus tachycardia 09/01/2014   Lactic acidosis 09/01/2014   Severe sepsis ([place]) 09/01/2014    Will check CXR today * No surgery found *    LOS: 1 day   Matt B. Hassell Done, MD, [facility] [phone] to reach the surgeon on call.    02/04/2022 9:09 AM

## 2022-02-05 ENCOUNTER — Encounter (HOSPITAL_COMMUNITY): Payer: Self-pay

## 2022-02-05 LAB — CBC
HCT: 36.1 % (ref 36.0–46.0)
Hemoglobin: 11.7 g/dL — ABNORMAL LOW (ref 12.0–15.0)
MCH: 30.5 pg (ref 26.0–34.0)
MCHC: 32.4 g/dL (ref 30.0–36.0)
MCV: 94 fL (ref 80.0–100.0)
Platelets: 213 10*3/uL (ref 150–400)
RBC: 3.84 MIL/uL — ABNORMAL LOW (ref 3.87–5.11)
RDW: 13.2 % (ref 11.5–15.5)
WBC: 10.4 10*3/uL (ref 4.0–10.5)
nRBC: 0 % (ref 0.0–0.2)

## 2022-02-05 LAB — BASIC METABOLIC PANEL
Anion gap: 8 (ref 5–15)
BUN: 7 mg/dL (ref 6–20)
CO2: 26 mmol/L (ref 22–32)
Calcium: 7.8 mg/dL — ABNORMAL LOW (ref 8.9–10.3)
Chloride: 104 mmol/L (ref 98–111)
Creatinine, Ser: 0.88 mg/dL (ref 0.44–1.00)
GFR, Estimated: >60 mL/min (ref >60)
Glucose, Bld: 115 mg/dL — ABNORMAL HIGH (ref 70–99)
Potassium: 3.2 mmol/L — ABNORMAL LOW (ref 3.5–5.1)
Sodium: 138 mmol/L (ref 135–145)

## 2022-02-05 MED ORDER — OXYCODONE-ACETAMINOPHEN 7.5-325 MG PO TABS: 1.0000 | ORAL_TABLET | ORAL | 0 refills | Status: AC | PRN

## 2022-02-05 MED ORDER — DOCUSATE SODIUM 100 MG PO CAPS: 100.0000 mg | ORAL_CAPSULE | Freq: Two times a day (BID) | ORAL | Status: AC | PRN

## 2022-02-05 MED ORDER — CHLORPHENIRAMINE MALEATE 4 MG PO TABS
4.0000 mg | ORAL_TABLET | Freq: Three times a day (TID) | ORAL | Status: DC | PRN
  Administered 2022-02-05: 4 mg via ORAL
  Filled 2022-02-05 (×3): qty 1

## 2022-02-05 MED ORDER — IBUPROFEN 800 MG PO TABS: 800.0000 mg | ORAL_TABLET | Freq: Three times a day (TID) | ORAL | Status: AC | PRN

## 2022-02-05 MED ORDER — LIDOCAINE 5 % EX PTCH: 1.0000 | MEDICATED_PATCH | CUTANEOUS | 0 refills | Status: AC

## 2022-02-05 MED ORDER — POTASSIUM CHLORIDE CRYS ER 20 MEQ PO TBCR
30.0000 meq | EXTENDED_RELEASE_TABLET | Freq: Once | ORAL | Status: AC
  Administered 2022-02-05: 30 meq via ORAL
  Filled 2022-02-05: qty 1

## 2022-02-05 NOTE — Evaluation (Signed)
Occupational Therapy Evaluation and Discharge Patient Details Name: [name] MRN: [id number] DOB: [birth year] Today's Date: 02/05/2022   History of Present Illness The pt is a 51 yo female presenting 10/13 reporting a fall on 10/11 resulting in R side pain. Imaging showed R rib 7-10 fx. PMH includes: anxiety, arthritis, chronic low back pain, drug-seeking behavior, HLD, HTN, PTSD, Bipolar disorder, ORIF of R ankle fx in 2021.   Clinical Impression   Pt is functioning modified independently. Has all necessary DME. Reinforced frequent position changes and use of IS. Pt and husband verbalized understanding. No OT needs.      Recommendations for follow up therapy are one component of a multi-disciplinary discharge planning process, led by the attending physician.  Recommendations may be updated based on patient status, additional functional criteria and insurance authorization.   Follow Up Recommendations  No OT follow up    Assistance Recommended at Discharge PRN  Patient can return home with the following Assist for transportation;Assistance with cooking/housework;Help with stairs or ramp for entrance    Functional Status Assessment  Patient has had a recent decline in their functional status and demonstrates the ability to make significant improvements in function in a reasonable and predictable amount of time.  Equipment Recommendations  None recommended by OT    Recommendations for Other Services       Precautions / Restrictions Precautions Precautions: Fall Precaution Comments: reports fall on 10/10 Restrictions Weight Bearing Restrictions: No      Mobility Bed Mobility Overal bed mobility: Modified Independent             General bed mobility comments: aware of log roll method, feels most comfortable on her R side    Transfers Overall transfer level: Independent Equipment used: None                      Balance                                            ADL either performed or assessed with clinical judgement   ADL Overall ADL's : Modified independent                                       General ADL Comments: plans to sit to shower     Vision Ability to See in Adequate Light: 0 Adequate Patient Visual Report: No change from baseline       Perception     Praxis      Pertinent Vitals/Pain Pain Assessment Pain Assessment: Faces Faces Pain Scale: Hurts little more Pain Location: R ribs Pain Descriptors / Indicators: Discomfort Pain Intervention(s): Monitored during session, Repositioned     Hand Dominance Right   Extremity/Trunk Assessment Upper Extremity Assessment Upper Extremity Assessment: Overall WFL for tasks assessed   Lower Extremity Assessment Lower Extremity Assessment: Defer to PT evaluation   Cervical / Trunk Assessment Cervical / Trunk Assessment: Normal;Other exceptions Cervical / Trunk Exceptions: pain from R rib fx   Communication Communication Communication: No difficulties   Cognition Arousal/Alertness: Awake/alert Behavior During Therapy: WFL for tasks assessed/performed Overall Cognitive Status: Within Functional Limits for tasks assessed  General Comments       Exercises     Shoulder Instructions      Home Living Family/patient expects to be discharged to:: Private residence Living Arrangements: Spouse/significant other Available Help at Discharge: Family;Available 24 hours/day Type of Home: House Home Access: Level entry     Home Layout: One level     Bathroom Shower/Tub: Teacher, early years/pre: Standard     Home Equipment: Conservation officer, nature (2 wheels);Cane - single point;Wheelchair - Brewing technologist          Prior Functioning/Environment Prior Level of Function : Independent/Modified Independent                        OT Problem List:         OT Treatment/Interventions:      OT Goals(Current goals can be found in the care plan section)    OT Frequency:      Co-evaluation              AM-PAC OT "6 Clicks" Daily Activity     Outcome Measure Help from another person eating meals?: None Help from another person taking care of personal grooming?: None Help from another person toileting, which includes using toliet, bedpan, or urinal?: None Help from another person bathing (including washing, rinsing, drying)?: None Help from another person to put on and taking off regular upper body clothing?: None Help from another person to put on and taking off regular lower body clothing?: None 6 Click Score: 24   End of Session    Activity Tolerance: Patient tolerated treatment well Patient left: in bed;with call bell/phone within reach;with family/visitor present  OT Visit Diagnosis: Pain                Time: 3474-2595 OT Time Calculation (min): 20 min Charges:  OT General Charges $OT Visit: 1 Visit OT Evaluation $OT Eval Low Complexity: [place], OTR/L Acute Rehabilitation Services Office: [phone]  Malka So 02/05/2022, 12:05 PM

## 2022-02-05 NOTE — Plan of Care (Signed)
  Problem: Health Behavior/Discharge Planning: Goal: Ability to manage health-related needs will improve Outcome: Progressing   

## 2022-02-05 NOTE — Social Work (Signed)
CSW attempted to speak with pt to complete CAGE AID, pt asked CSW to come back bc she had just started eating breakfast with her spouse. CSW will follow up at a later time.

## 2022-02-05 NOTE — Discharge Instructions (Signed)
RIB FRACTURES  HOME INSTRUCTIONS   PAIN CONTROL:  Pain is best controlled by a usual combination of three different methods TOGETHER:  Ice/Heat Over the counter pain medication Prescription pain medication You may experience some swelling and bruising in area of broken ribs. Ice packs or heating pads (30-60 minutes up to 6 times a day) will help. Use ice for the first few days to help decrease swelling and bruising, then switch to heat to help relax tight/sore spots and speed recovery. Some people prefer to use ice alone, heat alone, alternating between ice & heat. Experiment to what works for you. Swelling and bruising can take several weeks to resolve.  It is helpful to take an over-the-counter pain medication regularly for the first few weeks. Choose one of the following that works best for you:  Naproxen (Aleve, etc) Two 280m tabs twice a day Ibuprofen (Advil, etc) Three 2043mtabs four times a day (every meal & bedtime) Acetaminophen (Tylenol, etc) 500-6503mour times a day (every meal & bedtime) A prescription for pain medication (such as oxycodone, hydrocodone, etc) may be given to you upon discharge. Take your pain medication as prescribed.  If you are having problems/concerns with the prescription medicine (does not control pain, nausea, vomiting, rash, itching, etc), please call us [phone] see if we need to switch you to a different pain medicine that will work better for you and/or control your side effect better. If you need a refill on your pain medication, please contact your pharmacy. They will contact our office to request authorization. Prescriptions will not be filled after 5 pm or on week-ends. Avoid getting constipated. When taking pain medications, it is common to experience some constipation. Increasing fluid intake and taking a fiber supplement (such as Metamucil, Citrucel, FiberCon, MiraLax, etc) 1-2 times a day regularly will usually help prevent this problem  from occurring. A mild laxative (prune juice, Milk of Magnesia, MiraLax, etc) should be taken according to package directions if there are no bowel movements after 48 hours.  Watch out for diarrhea. If you have many loose bowel movements, simplify your diet to bland foods & liquids for a few days. Stop any stool softeners and decrease your fiber supplement. Switching to mild anti-diarrheal medications (Kayopectate, Pepto Bismol) can help. If this worsens or does not improve, please call us.[place]OLLOW UP  If a follow up appointment is needed one will be scheduled for you. If none is needed with our trauma team, please follow up with your primary care provider within 2-3 weeks from discharge. Please call [facility] at [phone] if you have any questions about follow up.  If you have any orthopedic or other injuries you will need to follow up as outlined in your follow up instructions.   WHEN TO CALL US [phone] pain control Reactions / problems with new medications (rash/itching, nausea, etc)  Fever over 101.5 F (38.5 C) Worsening swelling or bruising Worsening pain, productive cough, difficulty breathing or any other concerning symptoms  The clinic staff is available to answer your questions during regular business hours (8:30am-5pm). Please don't hesitate to call and ask to speak to one of our nurses for clinical concerns.  If you have a medical emergency, go to the nearest emergency room or call 911.  A surgeon from [facility] is always on call at the [facility] [place] [id number] MAIN: [phone]  [phone] ? TOLL FREE: [phone] ?  FAX [phone]  [email or web address]      Information on Rib Fractures  A rib fracture is a break or crack in one of the bones of the ribs. The ribs are long, curved bones that wrap around your chest and attach to your spine and your breastbone. The  ribs protect your heart, lungs, and other organs in the chest. A broken or cracked rib is often painful but is not usually serious. Most rib fractures heal on their own over time. However, rib fractures can be more serious if multiple ribs are broken or if broken ribs move out of place and push against other structures or organs. What are the causes? This condition is caused by: Repetitive movements with high force, such as pitching a baseball or having severe coughing spells. A direct blow to the chest, such as a sports injury, a car accident, or a fall. Cancer that has spread to the bones, which can weaken bones and cause them to break. What are the signs or symptoms? Symptoms of this condition include: Pain when you breathe in or cough. Pain when someone presses on the injured area. Feeling short of breath. How is this diagnosed? This condition is diagnosed with a physical exam and medical history. Imaging tests may also be done, such as: Chest X-ray. CT scan. MRI. Lohn scan. Chest ultrasound. How is this treated? Treatment for this condition depends on the severity of the fracture. Most rib fractures usually heal on their own in 1-3 months. Sometimes healing takes longer if there is a cough that does not stop or if there are other activities that make the injury worse (aggravating factors). While you heal, you will be given medicines to control the pain. You will also be taught deep breathing exercises. Severe injuries may require hospitalization or surgery. Follow these instructions at home: Managing pain, stiffness, and swelling If directed, apply ice to the injured area. Put ice in a plastic bag. Place a towel between your skin and the bag. Leave the ice on for 20 minutes, 2-3 times a day. Take over-the-counter and prescription medicines only as told by your health care provider. Activity Avoid a lot of activity and any activities or movements that cause pain. Be careful during  activities and avoid bumping the injured rib. Slowly increase your activity as told by your health care provider. General instructions Do deep breathing exercises as told by your health care provider. This helps prevent pneumonia, which is a common complication of a broken rib. Your health care provider may instruct you to: Take deep breaths several times a day. Try to cough several times a day, holding a pillow against the injured area. Use a device called incentive spirometer to practice deep breathing several times a day. Drink enough fluid to keep your urine pale yellow. Do not wear a rib belt or binder. These restrict breathing, which can lead to pneumonia. Keep all follow-up visits as told by your health care provider. This is important. Contact a health care provider if: You have a fever. Get help right away if: You have difficulty breathing or you are short of breath. You develop a cough that does not stop, or you cough up thick or bloody sputum. You have nausea, vomiting, or pain in your abdomen. Your pain gets worse and medicine does not help. Summary A rib fracture is a break or crack in one of the bones of the ribs. A broken or cracked rib is  often painful but is not usually serious. Most rib fractures heal on their own over time. Treatment for this condition depends on the severity of the fracture. Avoid a lot of activity and any activities or movements that cause pain. This information is not intended to replace advice given to you by your health care provider. Make sure you discuss any questions you have with your health care provider. Document Released: 04/09/2005 Document Revised: 07/09/2016 Document Reviewed: 07/09/2016 Elsevier Interactive Patient Education  2019 [employer]  

## 2022-02-05 NOTE — Discharge Summary (Signed)
Physician Discharge Summary  Patient ID: [name] MRN: [id number] DOB/AGE: 51-Nov-1972 51 y.o.  Admit date: 02/02/2022 Discharge date: 02/05/2022  Admission Diagnoses Multiple rib fractures [S22.49XA] Closed fracture of multiple ribs of right side, initial encounter [S22.41XA] Chronic pain  Discharge Diagnoses Multiple rib fractures [S22.49XA] Closed fracture of multiple ribs of right side, initial encounter [S22.41XA] Chronic pain  Consultants none  Procedures none  HPI:  [name] is an 51 y.o. female who presented as a trauma consult after a fall a few days ago.  Severe right sided chest pain.  Nausea and vomiting.  Pain getting worse so she came into ER.  Hospital Course:   Patient was admitted to the trauma service for ongoing care and pain control of her right 7-10 rib fractures. Pulmonary toilet with incentive spirometry was utilized during admission and follow up chest x ray showed improving atelectasis and no pneumothorax. Her home medications were ordered during admission including buprenorphine and reglan (chronic nausea and vomiting at baseline) and she completed course of doxycycline which had been ordered by outpatient provider for pneumonia coverage when she initially presented to urgent care. On date of discharge she had worked well with therapies and pain overall controlled.  I called and spoke to her pain clinic Jena Gauss NP) regarding pain medications and she will be discharged on oxycodone-acetaminophen 7.5 - 325 mg #20/0.  On date of discharge patient had appropriately progressed and met criteria for safe discharge home with the support of her significant other.  I discussed discharge instructions with patient as well as return precautions and all questions and concerns were addressed.   I or a member of my team have reviewed this patient in the Controlled Substance Database.  Patient agrees to follow up as below.  *I did not provide  care for this patient.  I updated this DC summary for completion.*  Allergies as of 02/05/2022   No Known Allergies      Medication List     STOP taking these medications    doxycycline 100 MG tablet Commonly known as: VIBRA-TABS       TAKE these medications    Accu-Chek Guide w/Device Kit Use as directed to check blood sugar up to 4 times daily.   acetaminophen 325 MG tablet Commonly known as: TYLENOL Take 2 tablets (650 mg total) by mouth every 6 (six) hours as needed for mild pain, fever or headache. What changed: reasons to take this   amLODipine 10 MG tablet Commonly known as: NORVASC Take 1 tablet (10 mg total) by mouth daily.   ARIPiprazole 5 MG tablet Commonly known as: ABILIFY Take 1 tablet (5 mg total) by mouth daily.   atorvastatin 10 MG tablet Commonly known as: LIPITOR Take 10 mg by mouth daily.   Belbuca 600 MCG Film Generic drug: Buprenorphine HCl Take 600 mcg by mouth every 12 (twelve) hours.   blood glucose meter kit and supplies Kit Dispense based on patient and insurance preference. Use up to four times daily as directed.   carvedilol 12.5 MG tablet Commonly known as: COREG Take 1 tablet (12.5 mg total) by mouth 2 (two) times daily with a meal.   diphenhydrAMINE 25 MG tablet Commonly known as: BENADRYL Take 75 mg by mouth at bedtime.   docusate sodium 100 MG capsule Commonly known as: COLACE Take 1 capsule (100 mg total) by mouth 2 (two) times daily as needed for up to 7 days for mild constipation or moderate constipation.  FLUoxetine 40 MG capsule Commonly known as: PROZAC Take 1 capsule (40 mg total) by mouth daily.   ibuprofen 800 MG tablet Commonly known as: ADVIL Take 1 tablet (800 mg total) by mouth every 8 (eight) hours as needed for up to 7 days for mild pain or moderate pain.   levothyroxine 50 MCG tablet Commonly known as: SYNTHROID Take 50 mcg by mouth daily.   lidocaine 5 % Commonly known as: LIDODERM Place 1  patch onto the skin daily for 7 days. Remove & Discard patch within 12 hours or as directed by MD   lisinopril 10 MG tablet Commonly known as: ZESTRIL Take 1 tablet (10 mg total) by mouth daily.   metFORMIN 500 MG tablet Commonly known as: GLUCOPHAGE Take 500 mg by mouth 2 (two) times daily with a meal.   metoCLOPramide 5 MG tablet Commonly known as: REGLAN Take 5 mg by mouth 2 (two) times daily.   multivitamin Tabs tablet Take 1 tablet by mouth at bedtime.   ondansetron 4 MG disintegrating tablet Commonly known as: ZOFRAN-ODT Take 1 tablet (4 mg total) by mouth every 6 (six) hours as needed for nausea or vomiting.   oxyCODONE-acetaminophen 7.5-325 MG tablet Commonly known as: Percocet Take 1 tablet by mouth every 4 (four) hours as needed for up to 5 days for severe pain. What changed:  when to take this reasons to take this   pantoprazole 40 MG tablet Commonly known as: Protonix Take 1 tablet (40 mg total) by mouth daily.   prazosin 2 MG capsule Commonly known as: MINIPRESS Take 1 capsule (2 mg total) by mouth at bedtime.   promethazine 25 MG suppository Commonly known as: PHENERGAN Place 1 suppository (25 mg total) rectally every 6 (six) hours as needed for nausea or vomiting.   tiZANidine 4 MG tablet Commonly known as: ZANAFLEX Take 3 tablets (12 mg total) by mouth every 8 (eight) hours as needed for muscle spasms.   traZODone 50 MG tablet Commonly known as: DESYREL Take 3 tablets (150 mg total) by mouth at bedtime.          Follow-up Information     Ferd Hibbs, NP Follow up.   Specialty: Nurse Practitioner Why: call to make appointment for appointment for continued follow up of rib fractures Contact information: [place] [address] [phone]         Jorene Guest, FNP Follow up.   Specialty: Nurse Practitioner Why: call to follow up regarding ongoing pain control Contact information: [address] [place] [address] [phone]                 Signed: Henreitta Cea, [facility] 02/05/2022, 1:45 PM Please see Amion for pager number during day hours 7:00am-4:30pm

## 2022-02-05 NOTE — Progress Notes (Signed)
Physical Therapy Treatment Patient Details Name: [name] MRN: [id number] DOB: 1970/12/11 Today's Date: 02/05/2022   History of Present Illness The pt is a 51 yo female presenting 10/13 reporting a fall on 10/11 resulting in R side pain. Imaging showed R rib 7-10 fx. PMH includes: anxiety, arthritis, chronic low back pain, drug-seeking behavior, HLD, HTN, PTSD, Bipolar disorder, ORIF of R ankle fx in 2021.    PT Comments    Pt overall is mobilizing well with fair pain control. Ambulating 300 ft with no assistive device at a modI level and negotiated 2 steps with railing to simulate home entrance. SpO2 93-96% on RA, HR 96-103 bpm. Education reinforced regarding activity recommendations, IS use, pillow splinting for comfort. Pt with no further questions/concerns. No further acute or follow up PT needs. Thank you for this consult.    Recommendations for follow up therapy are one component of a multi-disciplinary discharge planning process, led by the attending physician.  Recommendations may be updated based on patient status, additional functional criteria and insurance authorization.  Follow Up Recommendations  No PT follow up     Assistance Recommended at Discharge Intermittent Supervision/Assistance  Patient can return home with the following Assistance with cooking/housework;Help with stairs or ramp for entrance   Equipment Recommendations  None recommended by PT    Recommendations for Other Services       Precautions / Restrictions Precautions Precautions: Fall Precaution Comments: reports fall on 10/10 Restrictions Weight Bearing Restrictions: No     Mobility  Bed Mobility Overal bed mobility: Needs Assistance Bed Mobility: Rolling, Sidelying to Sit, Sit to Sidelying Rolling: Modified independent (Device/Increase time) Sidelying to sit: Min assist     Sit to sidelying: Modified independent (Device/Increase time) General bed mobility comments: Pt exiting  towards left side of bed, cues for log roll technique, pt seeking HHA to pull trunk to upright position    Transfers Overall transfer level: Independent Equipment used: None                    Ambulation/Gait Ambulation/Gait assistance: Modified independent (Device/Increase time) Gait Distance (Feet): 300 Feet Assistive device: None Gait Pattern/deviations: Step-through pattern, Decreased stride length Gait velocity: decreased     General Gait Details: slow but steady   Stairs Stairs: Yes Stairs assistance: Modified independent (Device/Increase time) Stair Management: One rail Right Number of Stairs: 2 General stair comments: cues for step by step for pain management   Wheelchair Mobility    Modified Rankin (Stroke Patients Only)       Balance Overall balance assessment: Mild deficits observed, not formally tested                                          Cognition Arousal/Alertness: Awake/alert Behavior During Therapy: WFL for tasks assessed/performed Overall Cognitive Status: Within Functional Limits for tasks assessed                                          Exercises      General Comments        Pertinent Vitals/Pain Pain Assessment Pain Assessment: Faces Faces Pain Scale: Hurts little more Pain Location: R ribs Pain Descriptors / Indicators: Discomfort Pain Intervention(s): Monitored during session    Home Living  Prior Function            PT Goals (current goals can now be found in the care plan section) Acute Rehab PT Goals Patient Stated Goal: return home, feel less nauseous PT Goal Formulation: With patient Time For Goal Achievement: 02/17/22 Potential to Achieve Goals: Good Progress towards PT goals: Goals met/education completed, patient discharged from PT    Frequency    Min 3X/week      PT Plan Other (comment) (d/c acute PT)    Co-evaluation               AM-PAC PT "6 Clicks" Mobility   Outcome Measure  Help needed turning from your back to your side while in a flat bed without using bedrails?: None Help needed moving from lying on your back to sitting on the side of a flat bed without using bedrails?: A Little Help needed moving to and from a bed to a chair (including a wheelchair)?: None Help needed standing up from a chair using your arms (e.g., wheelchair or bedside chair)?: None Help needed to walk in hospital room?: None Help needed climbing 3-5 steps with a railing? : None 6 Click Score: 23    End of Session   Activity Tolerance: Patient tolerated treatment well Patient left: in bed;with call bell/phone within reach Nurse Communication: Mobility status PT Visit Diagnosis: Other abnormalities of gait and mobility (R26.89);Muscle weakness (generalized) (M62.81)     Time: 2707-8675 PT Time Calculation (min) (ACUTE ONLY): 14 min  Charges:  $Therapeutic Activity: 8-22 mins                     Wyona Almas, PT, DPT Acute Rehabilitation Services Office [phone]    Deno Etienne 02/05/2022, 9:57 AM

## 2022-02-05 NOTE — Progress Notes (Signed)
Patient discharged today, went over discharge paperwork, medications and follow up appts.  Let her know what medications were sent to the pharmacy. She was adamant about her pain medication. I explained when she can/cannot take her medication and showed her on the paperwork as well. Husband was present in room for discharge instructions as well. Asked if she had further questions which they denied.

## 2022-02-05 NOTE — Progress Notes (Signed)
Progress Note     Subjective: Having expected pain from rib fractures. Tolerating diet. Mild cough and sore throat. No worsening SHOB but is on supp O2 currently. Using IS  Objective: Vital signs in last 24 hours: Temp:  [97.7 F (36.5 C)-98.3 F (36.8 C)] 97.7 F (36.5 C) (10/16 0757) Pulse Rate:  [66-89] 89 (10/16 0757) Resp:  [16] 16 (10/16 0757) BP: (85-130)/(58-93) 130/93 (10/16 0757) SpO2:  [96 %-100 %] 98 % (10/16 0757) Last BM Date : 02/01/22  Intake/Output from previous day: 10/15 0701 - 10/16 0700 In: 300 [P.O.:300] Out: 600 [Urine:600] Intake/Output this shift: No intake/output data recorded.   PE: General: pleasant, WD, /female who is sitting up in chair in NAD HEENT: head is normocephalic, atraumatic.  Sclera are noninjected.  Pupils equal and round. EOMs intact.  Ears and nose without any masses or lesions.  Mouth is pink and moist Heart: regular, rate, and rhythm.    Lungs: CTAB, no wheezes, rhonchi, or rales noted.  Respiratory effort nonlabored. Initially on 1 lpm supp O2 via [place]. Turned off during exam and spo2 remained >90% on monitor Abd: soft, NT, ND MSK: all 4 extremities are symmetrical with no cyanosis, clubbing, or edema. Skin: warm and dry with no masses, lesions, or rashes Neuro: Cranial nerves 2-12 grossly intact, sensation is normal throughout Psych: A&Ox3 with an appropriate affect.    Lab Results:  Recent Labs    02/04/22 0343 02/05/22 0236  WBC 13.4* 10.4  HGB 12.4 11.7*  HCT 37.1 36.1  PLT 253 213   BMET Recent Labs    02/04/22 0343 02/05/22 0236  NA 141 138  K 3.2* 3.2*  CL 105 104  CO2 26 26  GLUCOSE 124* 115*  BUN 6 7  CREATININE 1.06* 0.88  CALCIUM 8.1* 7.8*   PT/INR No results for input(s): "LABPROT", "INR" in the last 72 hours. CMP     Component Value Date/Time   NA 138 02/05/2022 0236   K 3.2 (L) 02/05/2022 0236   CL 104 02/05/2022 0236   CO2 26 02/05/2022 0236   GLUCOSE 115 (H) 02/05/2022 0236   BUN 7  02/05/2022 0236   CREATININE 0.88 02/05/2022 0236   CALCIUM 7.8 (L) 02/05/2022 0236   PROT 7.9 02/03/2022 0038   ALBUMIN 3.4 (L) 02/03/2022 0038   AST 33 02/03/2022 0038   ALT 21 02/03/2022 0038   ALKPHOS 111 02/03/2022 0038   BILITOT 0.8 02/03/2022 0038   GFRNONAA >60 02/05/2022 0236   GFRAA >60 05/05/2019 1207   Lipase     Component Value Date/Time   LIPASE 20 12/27/2021 1229       Studies/Results: DG Chest 2 View  Result Date: 02/04/2022 CLINICAL DATA:  Rib fractures. EXAM: CHEST - 2 VIEW COMPARISON:  02/03/2022 chest radiograph, CT and prior studies FINDINGS: This is a mildly low volume study. Mild RIGHT basilar atelectasis is slightly improved. The cardiomediastinal silhouette is unchanged. There is no evidence of pneumothorax or large pleural effusion. The known RIGHT rib fractures are difficult to visualize on this study. IMPRESSION: Slightly improved RIGHT basilar atelectasis. No evidence of pneumothorax. No other significant change. Electronically Signed   By: Margarette Canada M.D.   On: 02/04/2022 10:08   DG CHEST PORT 1 VIEW  Result Date: 02/03/2022 CLINICAL DATA:  Shortness of breath EXAM: PORTABLE CHEST 1 VIEW COMPARISON:  02/02/2022 FINDINGS: Heart is borderline in size. Bibasilar linear opacities, likely atelectasis. Mild elevation of the right hemidiaphragm. No overt edema. No  effusions or acute bony abnormality. IMPRESSION: Bibasilar atelectasis. Electronically Signed   By: Rolm Baptise M.D.   On: 02/03/2022 19:07    Anti-infectives: Anti-infectives (From admission, onward)    Start     Dose/Rate Route Frequency Ordered Stop   02/03/22 1000  doxycycline (VIBRA-TABS) tablet 100 mg        100 mg Oral 2 times daily 02/03/22 0650 02/04/22 2145        Assessment/Plan Fall several days prior to admission  R 7-10 rib fractures - Pain control (difficult with narcotic history), PT/OT, pulmonary toilet   FEN - Reg VTE - Lovenox and Sequential Compression Devices ID  - none   Dispo - discharge today  I reviewed last 24 h vitals and pain scores, last 48 h intake and output, last 24 h labs and trends, and last 24 h imaging results.    LOS: 2 days   [facility] 02/05/2022, 11:17 AM Please see Amion for pager number during day hours 7:00am-4:30pm

## 2022-03-03 ENCOUNTER — Emergency Department: Payer: Medicare Other

## 2022-03-03 ENCOUNTER — Observation Stay: Payer: Medicare Other

## 2022-03-03 ENCOUNTER — Encounter: Payer: Self-pay | Admitting: Emergency Medicine

## 2022-03-03 ENCOUNTER — Observation Stay
Admission: EM | Admit: 2022-03-03 | Discharge: 2022-03-04 | Disposition: A | Discharge status: Home / Self Care | Payer: Medicare Other | Attending: Student in an Organized Health Care Education/Training Program | Admitting: Student in an Organized Health Care Education/Training Program

## 2022-03-03 DIAGNOSIS — E039 Hypothyroidism, unspecified: Secondary | ICD-10-CM | POA: Diagnosis not present

## 2022-03-03 DIAGNOSIS — R1084 Generalized abdominal pain: Secondary | ICD-10-CM | POA: Diagnosis not present

## 2022-03-03 DIAGNOSIS — G894 Chronic pain syndrome: Secondary | ICD-10-CM | POA: Diagnosis present

## 2022-03-03 DIAGNOSIS — R000 Tachycardia, unspecified: Secondary | ICD-10-CM | POA: Diagnosis not present

## 2022-03-03 DIAGNOSIS — I10 Essential (primary) hypertension: Secondary | ICD-10-CM | POA: Diagnosis not present

## 2022-03-03 DIAGNOSIS — Z79899 Other long term (current) drug therapy: Secondary | ICD-10-CM | POA: Insufficient documentation

## 2022-03-03 DIAGNOSIS — E119 Type 2 diabetes mellitus without complications: Secondary | ICD-10-CM

## 2022-03-03 DIAGNOSIS — Z7984 Long term (current) use of oral hypoglycemic drugs: Secondary | ICD-10-CM | POA: Diagnosis not present

## 2022-03-03 DIAGNOSIS — Z85828 Personal history of other malignant neoplasm of skin: Secondary | ICD-10-CM | POA: Insufficient documentation

## 2022-03-03 DIAGNOSIS — F418 Other specified anxiety disorders: Secondary | ICD-10-CM | POA: Diagnosis present

## 2022-03-03 DIAGNOSIS — R112 Nausea with vomiting, unspecified: Secondary | ICD-10-CM | POA: Diagnosis present

## 2022-03-03 LAB — URINALYSIS, ROUTINE W REFLEX MICROSCOPIC
Bacteria, UA: NONE SEEN
Bilirubin Urine: NEGATIVE
Glucose, UA: NEGATIVE mg/dL
Hgb urine dipstick: NEGATIVE
Ketones, ur: 5 mg/dL — AB
Nitrite: NEGATIVE
Protein, ur: NEGATIVE mg/dL
Specific Gravity, Urine: 1.036 — ABNORMAL HIGH (ref 1.005–1.030)
pH: 5 (ref 5.0–8.0)

## 2022-03-03 LAB — LIPASE, BLOOD: Lipase: 25 U/L (ref 11–51)

## 2022-03-03 LAB — URINE DRUG SCREEN, QUALITATIVE (ARMC ONLY)
Amphetamines, Ur Screen: POSITIVE — AB
Barbiturates, Ur Screen: NOT DETECTED
Benzodiazepine, Ur Scrn: NOT DETECTED
Cannabinoid 50 Ng, Ur ~~LOC~~: NOT DETECTED
Cocaine Metabolite,Ur ~~LOC~~: NOT DETECTED
MDMA (Ecstasy)Ur Screen: NOT DETECTED
Methadone Scn, Ur: NOT DETECTED
Opiate, Ur Screen: POSITIVE — AB
Phencyclidine (PCP) Ur S: NOT DETECTED
Tricyclic, Ur Screen: POSITIVE — AB

## 2022-03-03 LAB — PREGNANCY, URINE: Preg Test, Ur: NEGATIVE

## 2022-03-03 LAB — CBC
HCT: 46.1 % — ABNORMAL HIGH (ref 36.0–46.0)
Hemoglobin: 14.9 g/dL (ref 12.0–15.0)
MCH: 29.8 pg (ref 26.0–34.0)
MCHC: 32.3 g/dL (ref 30.0–36.0)
MCV: 92.2 fL (ref 80.0–100.0)
Platelets: 409 10*3/uL — ABNORMAL HIGH (ref 150–400)
RBC: 5 MIL/uL (ref 3.87–5.11)
RDW: 13.8 % (ref 11.5–15.5)
WBC: 14.6 10*3/uL — ABNORMAL HIGH (ref 4.0–10.5)
nRBC: 0 % (ref 0.0–0.2)

## 2022-03-03 LAB — COMPREHENSIVE METABOLIC PANEL
ALT: 27 U/L (ref 0–44)
AST: 52 U/L — ABNORMAL HIGH (ref 15–41)
Albumin: 3.2 g/dL — ABNORMAL LOW (ref 3.5–5.0)
Alkaline Phosphatase: 115 U/L (ref 38–126)
Anion gap: 16 — ABNORMAL HIGH (ref 5–15)
BUN: <5 mg/dL — ABNORMAL LOW (ref 6–20)
CO2: 18 mmol/L — ABNORMAL LOW (ref 22–32)
Calcium: 9 mg/dL (ref 8.9–10.3)
Chloride: 106 mmol/L (ref 98–111)
Creatinine, Ser: 0.97 mg/dL (ref 0.44–1.00)
GFR, Estimated: >60 mL/min (ref >60)
Glucose, Bld: 162 mg/dL — ABNORMAL HIGH (ref 70–99)
Potassium: 3.6 mmol/L (ref 3.5–5.1)
Sodium: 140 mmol/L (ref 135–145)
Total Bilirubin: 0.8 mg/dL (ref 0.3–1.2)
Total Protein: 7.7 g/dL (ref 6.5–8.1)

## 2022-03-03 LAB — TSH: TSH: 1.072 u[IU]/mL (ref 0.350–4.500)

## 2022-03-03 LAB — T4, FREE: Free T4: 1.44 ng/dL — ABNORMAL HIGH (ref 0.61–1.12)

## 2022-03-03 LAB — GLUCOSE, CAPILLARY
Glucose-Capillary: 125 mg/dL — ABNORMAL HIGH (ref 70–99)
Glucose-Capillary: 119 mg/dL — ABNORMAL HIGH (ref 70–99)

## 2022-03-03 LAB — CBG MONITORING, ED: Glucose-Capillary: 189 mg/dL — ABNORMAL HIGH (ref 70–99)

## 2022-03-03 MED ORDER — ARIPIPRAZOLE 2 MG PO TABS
5.0000 mg | ORAL_TABLET | Freq: Every day | ORAL | Status: DC
  Administered 2022-03-03 – 2022-03-04 (×2): 5 mg via ORAL
  Filled 2022-03-03: qty 1
  Filled 2022-03-03 (×2): qty 3

## 2022-03-03 MED ORDER — TRAZODONE HCL 50 MG PO TABS
150.0000 mg | ORAL_TABLET | Freq: Every day | ORAL | Status: DC
  Administered 2022-03-03: 150 mg via ORAL
  Filled 2022-03-03: qty 1

## 2022-03-03 MED ORDER — SODIUM CHLORIDE 0.45 % IV SOLN: INTRAVENOUS | Status: AC

## 2022-03-03 MED ORDER — LEVOTHYROXINE SODIUM 50 MCG PO TABS
50.0000 ug | ORAL_TABLET | Freq: Every day | ORAL | Status: DC
  Administered 2022-03-04: 50 ug via ORAL
  Filled 2022-03-03: qty 1

## 2022-03-03 MED ORDER — ATORVASTATIN CALCIUM 10 MG PO TABS
10.0000 mg | ORAL_TABLET | Freq: Every evening | ORAL | Status: DC
  Filled 2022-03-03: qty 1

## 2022-03-03 MED ORDER — IOHEXOL 300 MG/ML  SOLN
100.0000 mL | Freq: Once | INTRAMUSCULAR | Status: AC | PRN
  Administered 2022-03-03: 100 mL via INTRAVENOUS

## 2022-03-03 MED ORDER — SODIUM CHLORIDE 0.9 % IV BOLUS
1000.0000 mL | Freq: Once | INTRAVENOUS | Status: AC
  Administered 2022-03-03: 1000 mL via INTRAVENOUS

## 2022-03-03 MED ORDER — ONDANSETRON HCL 4 MG/2ML IJ SOLN
INTRAMUSCULAR | Status: AC
  Administered 2022-03-03: 4 mg
  Filled 2022-03-03: qty 2

## 2022-03-03 MED ORDER — METOCLOPRAMIDE HCL 5 MG/ML IJ SOLN
10.0000 mg | Freq: Three times a day (TID) | INTRAMUSCULAR | Status: AC
  Administered 2022-03-03 – 2022-03-04 (×3): 10 mg via INTRAVENOUS
  Filled 2022-03-03 (×3): qty 2

## 2022-03-03 MED ORDER — RENA-VITE PO TABS
1.0000 | ORAL_TABLET | Freq: Every day | ORAL | Status: DC
  Administered 2022-03-03: 1 via ORAL
  Filled 2022-03-03: qty 1

## 2022-03-03 MED ORDER — HALOPERIDOL LACTATE 5 MG/ML IJ SOLN
2.0000 mg | Freq: Once | INTRAMUSCULAR | Status: AC
  Administered 2022-03-03: 2 mg via INTRAVENOUS
  Filled 2022-03-03: qty 1

## 2022-03-03 MED ORDER — AMLODIPINE BESYLATE 10 MG PO TABS
10.0000 mg | ORAL_TABLET | Freq: Every day | ORAL | Status: DC
  Administered 2022-03-03 – 2022-03-04 (×2): 10 mg via ORAL
  Filled 2022-03-03 (×2): qty 1

## 2022-03-03 MED ORDER — ONDANSETRON HCL 4 MG/2ML IJ SOLN
4.0000 mg | Freq: Four times a day (QID) | INTRAMUSCULAR | Status: DC | PRN
  Administered 2022-03-03: 4 mg via INTRAVENOUS
  Filled 2022-03-03: qty 2

## 2022-03-03 MED ORDER — METOCLOPRAMIDE HCL 5 MG/ML IJ SOLN
10.0000 mg | Freq: Once | INTRAMUSCULAR | Status: AC
  Administered 2022-03-03: 10 mg via INTRAVENOUS
  Filled 2022-03-03: qty 2

## 2022-03-03 MED ORDER — ACETAMINOPHEN 325 MG PO TABS: 650.0000 mg | ORAL_TABLET | Freq: Four times a day (QID) | ORAL | Status: DC | PRN

## 2022-03-03 MED ORDER — ACETAMINOPHEN 500 MG PO TABS
1000.0000 mg | ORAL_TABLET | Freq: Once | ORAL | Status: AC
  Administered 2022-03-03: 1000 mg via ORAL
  Filled 2022-03-03: qty 2

## 2022-03-03 MED ORDER — PRAZOSIN HCL 2 MG PO CAPS
2.0000 mg | ORAL_CAPSULE | Freq: Every day | ORAL | Status: DC
  Filled 2022-03-03: qty 1

## 2022-03-03 MED ORDER — LISINOPRIL 10 MG PO TABS
10.0000 mg | ORAL_TABLET | Freq: Every day | ORAL | Status: DC
  Administered 2022-03-03 – 2022-03-04 (×2): 10 mg via ORAL
  Filled 2022-03-03 (×2): qty 1

## 2022-03-03 MED ORDER — TIZANIDINE HCL 4 MG PO TABS
12.0000 mg | ORAL_TABLET | Freq: Three times a day (TID) | ORAL | Status: DC | PRN
  Administered 2022-03-03 – 2022-03-04 (×3): 12 mg via ORAL
  Filled 2022-03-03 (×3): qty 3

## 2022-03-03 MED ORDER — CARVEDILOL 6.25 MG PO TABS
12.5000 mg | ORAL_TABLET | Freq: Once | ORAL | Status: AC
  Administered 2022-03-03: 12.5 mg via ORAL
  Filled 2022-03-03: qty 2

## 2022-03-03 MED ORDER — TIZANIDINE HCL 2 MG PO TABS
8.0000 mg | ORAL_TABLET | Freq: Once | ORAL | Status: AC
  Administered 2022-03-03: 8 mg via ORAL
  Filled 2022-03-03: qty 4

## 2022-03-03 MED ORDER — ONDANSETRON HCL 4 MG PO TABS: 4.0000 mg | ORAL_TABLET | Freq: Four times a day (QID) | ORAL | Status: DC | PRN

## 2022-03-03 MED ORDER — IOHEXOL 350 MG/ML SOLN
75.0000 mL | Freq: Once | INTRAVENOUS | Status: AC | PRN
  Administered 2022-03-03: 75 mL via INTRAVENOUS

## 2022-03-03 MED ORDER — MORPHINE SULFATE (PF) 4 MG/ML IV SOLN
4.0000 mg | Freq: Once | INTRAVENOUS | Status: AC
  Administered 2022-03-03: 4 mg via INTRAVENOUS
  Filled 2022-03-03: qty 1

## 2022-03-03 MED ORDER — ENOXAPARIN SODIUM 60 MG/0.6ML IJ SOSY
0.5000 mg/kg | PREFILLED_SYRINGE | INTRAMUSCULAR | Status: DC
  Administered 2022-03-03: 45 mg via SUBCUTANEOUS
  Filled 2022-03-03: qty 0.6

## 2022-03-03 MED ORDER — KETOROLAC TROMETHAMINE 15 MG/ML IJ SOLN
15.0000 mg | Freq: Once | INTRAMUSCULAR | Status: AC
  Administered 2022-03-03: 15 mg via INTRAVENOUS
  Filled 2022-03-03: qty 1

## 2022-03-03 MED ORDER — ONDANSETRON 4 MG PO TBDP
4.0000 mg | ORAL_TABLET | Freq: Once | ORAL | Status: AC | PRN
  Administered 2022-03-03: 4 mg via ORAL
  Filled 2022-03-03: qty 1

## 2022-03-03 MED ORDER — PANTOPRAZOLE SODIUM 40 MG IV SOLR
40.0000 mg | INTRAVENOUS | Status: DC
  Administered 2022-03-03 – 2022-03-04 (×2): 40 mg via INTRAVENOUS
  Filled 2022-03-03 (×2): qty 10

## 2022-03-03 MED ORDER — OXYCODONE-ACETAMINOPHEN 7.5-325 MG PO TABS
1.0000 | ORAL_TABLET | Freq: Four times a day (QID) | ORAL | Status: DC | PRN
  Administered 2022-03-03 – 2022-03-04 (×3): 1 via ORAL
  Filled 2022-03-03 (×3): qty 1

## 2022-03-03 MED ORDER — FLUOXETINE HCL 20 MG PO CAPS
40.0000 mg | ORAL_CAPSULE | Freq: Every day | ORAL | Status: DC
  Administered 2022-03-03 – 2022-03-04 (×2): 40 mg via ORAL
  Filled 2022-03-03 (×2): qty 2

## 2022-03-03 MED ORDER — CARVEDILOL 12.5 MG PO TABS
12.5000 mg | ORAL_TABLET | Freq: Two times a day (BID) | ORAL | Status: DC
  Administered 2022-03-04: 12.5 mg via ORAL
  Filled 2022-03-03: qty 1

## 2022-03-03 NOTE — ED Triage Notes (Signed)
Pt presents via POV with complaints of abdominal pain with associated N/V for the last 6 hours. Pt is diaphoretic in triage - declined oral nausea meds at this time. No meds taken PTA - states hasn't been able to take her night time meds due to vomiting. Denies CP or SOB.    [id number]

## 2022-03-03 NOTE — Assessment & Plan Note (Signed)
Patient presents to the ER for evaluation of refractory nausea and vomiting was noted to be tachycardic with initial heart rate of 159 bpm Twelve-lead EKG showed sinus tachycardia Initially sinus tachycardia was attributed to dehydration from GI losses from nausea and vomiting but chart review shows patient has a history of substance use/abuse and has several urine drug screens positive with amphetamines which she is not prescribed. Tachycardia may be secondary to substance use/abuse TSH is within normal limits Continue IV fluid hydration Monitor patient closely

## 2022-03-03 NOTE — ED Provider Notes (Signed)
7:10 AM Assumed care for off going team.   Blood pressure (!) 139/96, pulse (!) 129, temperature 98.1 F (36.7 C), temperature source Oral, resp. rate 13, height 5' 4.5" (1.638 m), weight 89.4 kg, last menstrual period 06/03/2018, SpO2 98 %.  See their HPI for full report but in brief pending re-eval of HR-  I was able to find a hospital admission where patient was admitted in June 2023 with significant sinus tachycardia at that time, thought to have cyclic vomiting secondary to diabetic gastroparesis.  There was some concern suspicion for amphetamine overdose given she had positive UDS and has no record of filling these in the past 2 years patient denies any EtOH use and has had negative EtOH labs previously.  She is also previously had a cardiac arrest from accidental opioid overdose therefore the plan was to avoid IV opioids  Patient been with insulin else for pain so patient was given 2 of IV Haldol  8:36 AM on reassessment patient reports still feeling very nauseous.  Asked patient about the positive amphetamines noted in UDS and she denies taking any amphetamines or weight loss or ADHD medications.  She states that she only takes her home medications.  We discussed trying to discharge home but she reports feeling extremely nauseous and not being able to.  Explained to patient she would not be getting any IV narcotics while in the hospital but if she wanted to stay for IV nausea meds because she cannot tolerate p.o. then we could do that.  She expressed understanding and will be admitted to the hospital for concern for gastroparesis and nausea, vomiting.                Vanessa Duvall, MD 03/03/22 [phone]

## 2022-03-03 NOTE — Assessment & Plan Note (Signed)
Hold metformin Check blood sugars every 4 hours while NPO

## 2022-03-03 NOTE — Assessment & Plan Note (Signed)
Continue Synthroid °

## 2022-03-03 NOTE — Assessment & Plan Note (Signed)
Continue aripiprazole, fluoxetine and trazodone

## 2022-03-03 NOTE — H&P (Signed)
History and Physical    Patient: [name] YKD:[id number] DOB: Dec 09, 1970 DOA: 03/03/2022 DOS: the patient was seen and examined on 03/03/2022 PCP: Ferd Hibbs, NP  Patient coming from: Home  Chief Complaint:  Chief Complaint  Patient presents with   Abdominal Pain   HPI: [name] is a 51 y.o. female with medical history significant for depression, chronic pain syndrome with drug-seeking behavior, status post cardiac arrest believed to be secondary to opiate overdose in Feb, 22,  hypertension, multiple urine drug screens positive for amphetamines (which she is not prescribed as an outpatient ), cyclic vomiting syndrome, diabetes mellitus on oral agents who presents to the ER for evaluation of abdominal pain, nausea and vomiting for several hours. Chart review shows that patient has had several hospitalizations for similar episodes with her last hospitalization being 10/14/21.  Patient had an upper endoscopy on 10/05/21 which showed a small hiatal hernia and patchy moderate inflammation characterized by edema, erythema in the gastric antrum. Per patient's symptoms started about 10 hours prior to her admission.  Abdominal pain is diffuse and is associated with persistent nausea, vomiting and diarrhea.  She has been unable to tolerate any oral intake. She denies having any chest pain, no shortness of breath, no dizziness, no lightheadedness, no palpitations, no headache, no urinary symptoms, no blurred vision, no focal deficit. She was noted to be tachycardic which was initially attributed to dehydration from GI losses when she arrived to ER and received 3 L of IV fluids.  She also received a dose of Coreg. CT scan of abdomen and pelvis shows no acute or inflammatory process identified in the abdomen or pelvis. Appendix remains normal. Healing right 7th through 10th rib fractures detailed last month. Chronic hepatic steatosis.  Mild aortic atherosclerosis. Patient will be referred  to observation status due to persistent tachycardia     Review of Systems: As mentioned in the history of present illness. All other systems reviewed and are negative. Past Medical History:  Diagnosis Date   Anxiety    Arthritis    "joints ache all over" (10/15/2014)   Barrett's esophagus    Bulging lumbar disc    Chronic lower back pain    DDD (degenerative disc disease), cervical    Depression    Drug-seeking behavior    Headache    "weekly" (10/15/2014)   Hyperlipemia    Hypertension    PTSD (post-traumatic stress disorder)    Skin cancer    "had them cut off my arms; don't know what kind"   Past Surgical History:  Procedure Laterality Date   ABLATION ON ENDOMETRIOSIS  2008   BIOPSY  12/27/2018   Procedure: BIOPSY;  Surgeon: Thornton Park, MD;  Location: [facility];  Service: Gastroenterology;;   BIOPSY  10/05/2021   Procedure: BIOPSY;  Surgeon: Gatha Mayer, MD;  Location: [facility];  Service: Gastroenterology;;   ESOPHAGOGASTRODUODENOSCOPY (EGD) WITH PROPOFOL N/A 12/27/2018   Procedure: ESOPHAGOGASTRODUODENOSCOPY (EGD) WITH PROPOFOL;  Surgeon: Thornton Park, MD;  Location: [facility];  Service: Gastroenterology;  Laterality: N/A;   ESOPHAGOGASTRODUODENOSCOPY (EGD) WITH PROPOFOL N/A 10/05/2021   Procedure: ESOPHAGOGASTRODUODENOSCOPY (EGD) WITH PROPOFOL;  Surgeon: Gatha Mayer, MD;  Location: [place];  Service: Gastroenterology;  Laterality: N/A;   HEMORRHOID SURGERY  ~ 2002   IR FLUORO GUIDE CV LINE RIGHT  06/14/2020   IR REMOVAL TUN CV CATH W/O FL  06/23/2020   IR US GUIDE VASC ACCESS RIGHT  06/14/2020   ORIF ANKLE FRACTURE Right 03/28/2020  Procedure: OPEN REDUCTION INTERNAL FIXATION (ORIF) RIGHT BIMALLEOLAR ANKLE FRACTURE;  Surgeon: Marchia Bond, MD;  Location: [place];  Service: Orthopedics;  Laterality: Right;   Social History:  reports that she has never smoked. She has never used smokeless tobacco. She reports that she does not  currently use alcohol. She reports that she does not use drugs.  No Known Allergies  Family History  Problem Relation Age of Onset   Breast cancer Mother    Diabetes Mother    Breast cancer Maternal Grandmother    Breast cancer Paternal Grandmother    Colon polyps Paternal Grandmother    Colon cancer Neg Hx    Esophageal cancer Neg Hx    Rectal cancer Neg Hx    Stomach cancer Neg Hx     Prior to Admission medications   Medication Sig Start Date End Date Taking? Authorizing Provider  amLODipine (NORVASC) 10 MG tablet Take 1 tablet (10 mg total) by mouth daily. 10/07/21  Yes Shelly Coss, MD  atorvastatin (LIPITOR) 10 MG tablet Take 10 mg by mouth daily. 08/26/20  Yes [provider]  carvedilol (COREG) 12.5 MG tablet Take 1 tablet (12.5 mg total) by mouth 2 (two) times daily with a meal. 10/06/21  Yes Adhikari, Amrit, MD  FLUoxetine (PROZAC) 40 MG capsule Take 1 capsule (40 mg total) by mouth daily. 07/01/20  Yes Angiulli, Lavon Paganini, PA-C  HYDROcodone-acetaminophen (NORCO) 10-325 MG tablet Take 1 tablet by mouth every 6 (six) hours as needed for severe pain. 02/09/22  Yes [provider]  levothyroxine (SYNTHROID) 50 MCG tablet Take 50 mcg by mouth daily.   Yes [provider]  lidocaine (XYLOCAINE) 5 % ointment Apply 1 Application topically daily as needed for mild pain. 02/26/22  Yes [provider]  lisinopril (ZESTRIL) 10 MG tablet Take 1 tablet (10 mg total) by mouth daily. 10/06/21 10/06/22 Yes Shelly Coss, MD  metFORMIN (GLUCOPHAGE) 500 MG tablet Take 500 mg by mouth 2 (two) times daily with a meal.   Yes [provider]  metoCLOPramide (REGLAN) 5 MG tablet Take 5 mg by mouth 2 (two) times daily. 01/01/22  Yes [provider]  multivitamin (RENA-VIT) TABS tablet Take 1 tablet by mouth at bedtime. 08/01/20  Yes Jamse Arn, MD  pantoprazole (PROTONIX) 40 MG tablet Take 1 tablet (40 mg total) by mouth daily. 09/21/21 09/21/22 Yes  Elodia Florence., MD  prazosin (MINIPRESS) 2 MG capsule Take 1 capsule (2 mg total) by mouth at bedtime. 08/01/20  Yes Jamse Arn, MD  acetaminophen (TYLENOL) 325 MG tablet Take 2 tablets (650 mg total) by mouth every 6 (six) hours as needed for mild pain, fever or headache. Patient taking differently: Take 650 mg by mouth every 6 (six) hours as needed for mild pain. 07/01/20   Angiulli, Lavon Paganini, PA-C  ARIPiprazole (ABILIFY) 5 MG tablet Take 1 tablet (5 mg total) by mouth daily. 08/01/20   Jamse Arn, MD  BELBUCA 600 MCG FILM Take 600 mcg by mouth every 12 (twelve) hours. Patient not taking: Reported on 03/03/2022 12/09/21   [provider]  diphenhydrAMINE (BENADRYL) 25 MG tablet Take 75 mg by mouth at bedtime.    [provider]  ondansetron (ZOFRAN-ODT) 4 MG disintegrating tablet Take 1 tablet (4 mg total) by mouth every 6 (six) hours as needed for nausea or vomiting. 11/08/21   Willia Craze, NP  oxyCODONE-acetaminophen (PERCOCET) 7.5-325 MG tablet Take 1 tablet by mouth every  6 (six) hours as needed for severe pain. Patient not taking: Reported on 03/03/2022 02/26/22   [provider]  promethazine (PHENERGAN) 25 MG suppository Place 1 suppository (25 mg total) rectally every 6 (six) hours as needed for nausea or vomiting. 12/27/21   Drenda Freeze, MD  tiZANidine (ZANAFLEX) 4 MG tablet Take 3 tablets (12 mg total) by mouth every 8 (eight) hours as needed for muscle spasms. 12/27/21   Drenda Freeze, MD  traZODone (DESYREL) 50 MG tablet Take 3 tablets (150 mg total) by mouth at bedtime. 09/21/21   Elodia Florence., MD    Physical Exam: Vitals:   03/03/22 0600 03/03/22 0737 03/03/22 0800 03/03/22 0933  BP: (!) 139/96 (!) 155/83 (!) 159/83   Pulse: (!) 129 (!) 101 (!) 109   Resp: '13 15 14   '$ Temp:    98.7 F (37.1 C)  TempSrc:    Oral  SpO2: 98% 92% 96%   Weight:      Height:       Physical Exam Vitals and nursing note reviewed.   Constitutional:      Appearance: She is obese.  HENT:     Head: Normocephalic and atraumatic.     Mouth/Throat:     Mouth: Mucous membranes are moist.  Cardiovascular:     Rate and Rhythm: Tachycardia present.  Pulmonary:     Effort: Pulmonary effort is normal.     Breath sounds: Normal breath sounds.  Abdominal:     General: Bowel sounds are normal.     Palpations: Abdomen is soft.     Comments: Central adiposity  Skin:    General: Skin is warm and dry.  Neurological:     General: No focal deficit present.     Mental Status: She is alert.  Psychiatric:        Mood and Affect: Mood normal.        Behavior: Behavior normal.     Data Reviewed: Relevant notes from primary care and specialist visits, past discharge summaries as available in EHR, including Care Everywhere. Prior diagnostic testing as pertinent to current admission diagnoses Updated medications and problem lists for reconciliation ED course, including vitals, labs, imaging, treatment and response to treatment Triage notes, nursing and pharmacy notes and ED provider's notes Notable results as noted in HPI Labs reviewed.  TSH 1.072, free T41.44, sodium 140, potassium 3.6, chloride 108, bicarb 18, glucose 162, BUN less than 5, creatinine 0.97, calcium 9.0, total protein 7.7, albumin 3.2, AST 52, ALT 27, alkaline phosphatase 115, total bilirubin 0.8, lipase 25, white count 14.6, globin 14.9, hematocrit 46, platelet count 409 Urine drug screen is positive for tricyclic's, amphetamines and opioids Urine analysis shows pyuria Chest x-ray reviewed by me shows no acute or inflammatory process identified in the abdomen or pelvis. Appendix remains normal. Healing right 7th through 10th rib fractures detailed last month. Chronic hepatic steatosis.  Mild aortic atherosclerosis. Twelve-lead EKG reviewed by me shows sinus tachycardia There are no new results to review at this time.  Assessment and Plan: * Refractory nausea and  vomiting Probably secondary to diabetic gastroparesis Keep patient n.p.o. for now Supportive care with scheduled IV Reglan, IV fluid hydration Trial of clears if symptoms improve  Essential hypertension Continue amlodipine, carvedilol and lisinopril  Tachycardia Patient presents to the ER for evaluation of refractory nausea and vomiting was noted to be tachycardic with initial heart rate of 159 bpm Twelve-lead EKG showed sinus tachycardia Initially sinus tachycardia was  attributed to dehydration from GI losses from nausea and vomiting but chart review shows patient has a history of substance use/abuse and has several urine drug screens positive with amphetamines which she is not prescribed. Tachycardia may be secondary to substance use/abuse TSH is within normal limits Continue IV fluid hydration Monitor patient closely  T2DM (type 2 diabetes mellitus) (HCC) Hold metformin Check blood sugars every 4 hours while NPO  Chronic pain syndrome We will continue patient's home dose of Percocet  Depression with anxiety Continue aripiprazole, fluoxetine and trazodone  Hypothyroidism Continue Synthroid      Advance Care Planning:   Code Status: Full Code   Consults: None  Family Communication: Greater than 50% of time was spent discussing patient's condition and plan of care with her at the bedside.  All questions and concerns have been addressed.  She verbalizes understanding and agrees with the plan.  Severity of Illness: The appropriate patient status for this patient is OBSERVATION. Observation status is judged to be reasonable and necessary in order to provide the required intensity of service to ensure the patient's safety. The patient's presenting symptoms, physical exam findings, and initial radiographic and laboratory data in the context of their medical condition is felt to place them at decreased risk for further clinical deterioration. Furthermore, it is anticipated that the  patient will be medically stable for discharge from the hospital within 2 midnights of admission.   Author: Collier Bullock, MD 03/03/2022 10:51 AM  For on call review [email or web address].

## 2022-03-03 NOTE — Discharge Instructions (Addendum)
Please follow up with GI for further workup if continuing to have symptoms.

## 2022-03-03 NOTE — Assessment & Plan Note (Signed)
We will continue patient's home dose of Percocet

## 2022-03-03 NOTE — ED Provider Notes (Signed)
[facility] Provider Note    Event Date/Time   First MD Initiated Contact with Patient 03/03/22 [phone]     (approximate)   History   Abdominal Pain   HPI  [name] is a 51 y.o. female   Past medical history of bipolar, type II diabetic, PTSD, chronic pain, presents to the emergency department with 1 day of lower abdominal pain nausea vomiting diarrhea.  No fevers or chills.  Poor p.o. intake over the last couple days due to nausea.  She is 3 years post menopause and denies vaginal bleeding.  She denies dysuria or frequency Denies blood in stools or blood in the emesis.  History was obtained via patient. External medical chart review includes discharge summary dated 02/05/2022 for trauma admission for a fall where she sustained several rib fractures without pneumothorax      Physical Exam   Triage Vital Signs: ED Triage Vitals  Enc Vitals Group     BP 03/03/22 0153 (!) 151/115     Pulse Rate 03/03/22 0153 (!) 159     Resp 03/03/22 0153 18     Temp 03/03/22 0153 98.1 F (36.7 C)     Temp Source 03/03/22 0153 Oral     SpO2 03/03/22 0153 95 %     Weight 03/03/22 0148 197 lb (89.4 kg)     Height 03/03/22 0148 5' 4.5" (1.638 m)     Head Circumference --      Peak Flow --      Pain Score 03/03/22 0148 5     Pain Loc --      Pain Edu? --      Excl. in [place]? --     Most recent vital signs: Vitals:   03/03/22 0530 03/03/22 0600  BP:  (!) 139/96  Pulse: (!) 113 (!) 129  Resp: 12 13  Temp:    SpO2: 100% 98%    General: Awake, no distress.  CV:  Dry mucus membranes.  Tachycardic 140s  Normotensive. Resp:  Normal effort.  Clear to auscultation Abd:  No distention.  There is tenderness to palpation diffusely without rigidity or guarding. Other:  Nontoxic-appearing, pleasant and conversant.   ED Results / Procedures / Treatments   Labs (all labs ordered are listed, but only abnormal results are displayed) Labs Reviewed  URINALYSIS,  ROUTINE W REFLEX MICROSCOPIC - Abnormal; Notable for the following components:      Result Value   Color, Urine YELLOW (*)    APPearance HAZY (*)    Specific Gravity, Urine 1.036 (*)    Ketones, ur 5 (*)    Leukocytes,Ua LARGE (*)    All other components within normal limits  CBC - Abnormal; Notable for the following components:   WBC 14.6 (*)    HCT 46.1 (*)    Platelets 409 (*)    All other components within normal limits  COMPREHENSIVE METABOLIC PANEL - Abnormal; Notable for the following components:   CO2 18 (*)    Glucose, Bld 162 (*)    BUN <5 (*)    Albumin 3.2 (*)    AST 52 (*)    Anion gap 16 (*)    All other components within normal limits  CBG MONITORING, ED - Abnormal; Notable for the following components:   Glucose-Capillary 189 (*)    All other components within normal limits  URINE CULTURE  LIPASE, BLOOD  T4, FREE  TSH  POC URINE PREG, ED  I reviewed labs and they are notable for white blood cell count of 14.6,  EKG  ED ECG REPORT I, Lucillie Garfinkel, the attending physician, personally viewed and interpreted this ECG.  Hemoglobin is 14.9 from 11.7, consistent with hemoconcentration in the setting of dehydration   Date: 03/03/2022  EKG Time: 0208  Rate: 150  Rhythm: sinus tachycardia  Axis: nl  Intervals:none  ST&T Change: no ischemic changes    RADIOLOGY I independently reviewed and interpreted chest x-ray and see no pneumothorax   PROCEDURES:  Critical Care performed: No  Procedures   MEDICATIONS ORDERED IN ED: Medications  ondansetron (ZOFRAN-ODT) disintegrating tablet 4 mg (4 mg Oral Patient Refused/Not Given 03/03/22 0204)  sodium chloride 0.9 % bolus 1,000 mL (0 mLs Intravenous Stopped 03/03/22 0448)  morphine (PF) 4 MG/ML injection 4 mg (4 mg Intravenous Given 03/03/22 0323)  ondansetron (ZOFRAN) 4 MG/2ML injection (4 mg  Given 03/03/22 0323)  iohexol (OMNIPAQUE) 300 MG/ML solution 100 mL (100 mLs Intravenous Contrast Given 03/03/22  0404)  sodium chloride 0.9 % bolus 1,000 mL (0 mLs Intravenous Stopped 03/03/22 0555)  tiZANidine (ZANAFLEX) tablet 8 mg (8 mg Oral Given 03/03/22 0443)  acetaminophen (TYLENOL) tablet 1,000 mg (1,000 mg Oral Given 03/03/22 0521)  ketorolac (TORADOL) 15 MG/ML injection 15 mg (15 mg Intravenous Given 03/03/22 0521)  metoCLOPramide (REGLAN) injection 10 mg (10 mg Intravenous Given 03/03/22 0528)  sodium chloride 0.9 % bolus 1,000 mL (1,000 mLs Intravenous New Bag/Given 03/03/22 0556)  carvedilol (COREG) tablet 12.5 mg (12.5 mg Oral Given 03/03/22 0603)     IMPRESSION / MDM / ASSESSMENT AND PLAN / ED COURSE  I reviewed the triage vital signs and the nursing notes.                              Differential diagnosis includes, but is not limited to, sepsis due to intra-abdominal infection, gastroenteritis, colitis, or urinary infection, dehydration with AKI, metabolic derangements, intra-abdominal bleeding or pneumothorax that may account in her tachycardia w traumatic injuries last month The patient is on the cardiac monitor to evaluate for evidence of arrhythmia and/or significant heart rate changes.  MDM: CT scan of the abdomen pelvis to assess for intra-abdominal infection. IV crystalloid bolus for dehydration iso n/v/d. Check labs, UA. No new trauma so doubt abd pain or tachycardia driven by bleeding or pneumothorax from prior injuries but CT abd/pelvis will be revealing and fortunately CXR w no sign of pneumothorax. Give IV morphine and zofran for pain and nausea.    Pain improved with medications as above.  Patient stable in the emergency department with improving heart rate after 2 L of crystalloid though she remains tachycardic.  She takes Coreg in the morning which I will administer now.  She has a history of hypothyroid on Synthroid, will check thyroid function.  Fortunately the CT scan of her abdomen pelvis showed no emergent pathology.  Urinalysis shows no bacteria.  Nausea vomiting  and diarrhea most consistent with gastroenteritis and she appears clinically dehydrated and now on her third liter of crystalloid IV but continues to be tachycardic, will complete fluids and Coreg and check thyroid function.    Patient's presentation is most consistent with acute presentation with potential threat to life or bodily function.       FINAL CLINICAL IMPRESSION(S) / ED DIAGNOSES   Final diagnoses:  Generalized abdominal pain  Nausea vomiting and diarrhea     Rx / DC  Orders   ED Discharge Orders     None        Note:  This document was prepared using Dragon voice recognition software and may include unintentional dictation errors.    Lucillie Garfinkel, MD 03/03/22 [phone]

## 2022-03-03 NOTE — Assessment & Plan Note (Signed)
Continue amlodipine, carvedilol and lisinopril

## 2022-03-03 NOTE — Assessment & Plan Note (Signed)
Probably secondary to diabetic gastroparesis Keep patient n.p.o. for now Supportive care with scheduled IV Reglan, IV fluid hydration Trial of clears if symptoms improve

## 2022-03-04 DIAGNOSIS — R1084 Generalized abdominal pain: Secondary | ICD-10-CM

## 2022-03-04 DIAGNOSIS — R112 Nausea with vomiting, unspecified: Secondary | ICD-10-CM | POA: Diagnosis not present

## 2022-03-04 DIAGNOSIS — R000 Tachycardia, unspecified: Secondary | ICD-10-CM | POA: Diagnosis not present

## 2022-03-04 LAB — CBC
HCT: 36.4 % (ref 36.0–46.0)
Hemoglobin: 12.1 g/dL (ref 12.0–15.0)
MCH: 30.3 pg (ref 26.0–34.0)
MCHC: 33.2 g/dL (ref 30.0–36.0)
MCV: 91.2 fL (ref 80.0–100.0)
Platelets: 272 10*3/uL (ref 150–400)
RBC: 3.99 MIL/uL (ref 3.87–5.11)
RDW: 14.1 % (ref 11.5–15.5)
WBC: 7.8 10*3/uL (ref 4.0–10.5)
nRBC: 0 % (ref 0.0–0.2)

## 2022-03-04 LAB — BASIC METABOLIC PANEL
Anion gap: 6 (ref 5–15)
BUN: <5 mg/dL — ABNORMAL LOW (ref 6–20)
CO2: 22 mmol/L (ref 22–32)
Calcium: 8.1 mg/dL — ABNORMAL LOW (ref 8.9–10.3)
Chloride: 113 mmol/L — ABNORMAL HIGH (ref 98–111)
Creatinine, Ser: 0.72 mg/dL (ref 0.44–1.00)
GFR, Estimated: >60 mL/min (ref >60)
Glucose, Bld: 119 mg/dL — ABNORMAL HIGH (ref 70–99)
Potassium: 3.3 mmol/L — ABNORMAL LOW (ref 3.5–5.1)
Sodium: 141 mmol/L (ref 135–145)

## 2022-03-04 LAB — URINE CULTURE: Culture: 30000 — AB

## 2022-03-04 LAB — HEMOGLOBIN A1C
Hgb A1c MFr Bld: 5.3 % (ref 4.8–5.6)
Mean Plasma Glucose: 105.41 mg/dL

## 2022-03-04 LAB — GLUCOSE, CAPILLARY
Glucose-Capillary: 104 mg/dL — ABNORMAL HIGH (ref 70–99)
Glucose-Capillary: 125 mg/dL — ABNORMAL HIGH (ref 70–99)
Glucose-Capillary: 126 mg/dL — ABNORMAL HIGH (ref 70–99)
Glucose-Capillary: 165 mg/dL — ABNORMAL HIGH (ref 70–99)

## 2022-03-04 MED ORDER — POTASSIUM CHLORIDE 10 MEQ/100ML IV SOLN: 10.0000 meq | INTRAVENOUS | Status: DC

## 2022-03-04 MED ORDER — POTASSIUM CHLORIDE CRYS ER 20 MEQ PO TBCR
40.0000 meq | EXTENDED_RELEASE_TABLET | Freq: Two times a day (BID) | ORAL | Status: DC
  Administered 2022-03-04: 40 meq via ORAL
  Filled 2022-03-04: qty 2

## 2022-03-04 NOTE — Plan of Care (Signed)

## 2022-03-04 NOTE — Progress Notes (Signed)
Discharge packet given to patient. Patient verbalizes understanding. Husband providing transportation. Patient wheeled out by staff.

## 2022-03-04 NOTE — Plan of Care (Signed)
  Problem: Health Behavior/Discharge Planning: Goal: Ability to manage health-related needs will improve Outcome: Progressing   Problem: Clinical Measurements: Goal: Will remain free from infection Outcome: Progressing   Problem: Clinical Measurements: Goal: Cardiovascular complication will be avoided Outcome: Progressing   Problem: Safety: Goal: Ability to remain free from injury will improve Outcome: Progressing   Problem: Skin Integrity: Goal: Risk for impaired skin integrity will decrease Outcome: Progressing

## 2022-03-06 NOTE — Discharge Summary (Addendum)
Physician Discharge Summary  Patient: [name] FKC:[id number] DOB: 30-Jun-1970   Code Status: Prior Admit date: 03/03/2022 Discharge date: 03/04/2022 Disposition: Home, No home health services recommended PCP: Ferd Hibbs, NP  Recommendations for Outpatient Follow-up:  Follow up with PCP within 1-2 weeks Regarding general hospital follow up Consider swallow dysfunction evaluation Follow up with GI  Regarding refractory and frequent N/V  Discharge Diagnoses:  Principal Problem:   Refractory nausea and vomiting Active Problems:   Essential hypertension   Tachycardia   T2DM (type 2 diabetes mellitus) ([place])   Chronic pain syndrome   Depression with anxiety   Hypothyroidism  Brief Hospital Course Summary: [name] is a 51 y.o. female with medical history significant for depression, chronic pain syndrome with drug-seeking behavior, status post cardiac arrest believed to be secondary to opiate overdose in Feb, 22,  hypertension, multiple urine drug screens positive for amphetamines (which she is not prescribed as an outpatient ), cyclic vomiting syndrome, diabetes mellitus on oral agents who presents to the ER for evaluation of abdominal pain, nausea and vomiting for several hours. Chart review shows that patient has had several hospitalizations for similar episodes with her last hospitalization being 10/14/21.  Patient had an upper endoscopy on 10/05/21 which showed a small hiatal hernia and patchy moderate inflammation characterized by edema, erythema in the gastric antrum. Per patient, symptoms started about 10 hours prior to her admission. Abdominal pain is diffuse and is associated with persistent nausea, vomiting and diarrhea.  She has been unable to tolerate any oral intake. She denies having any chest pain, no shortness of breath, no dizziness, no lightheadedness, no palpitations, no headache, no urinary symptoms, no blurred vision, no focal deficit. She was noted  to be tachycardic which was initially attributed to dehydration from GI losses when she arrived to ER and received 3 L of IV fluids. She also received a dose of Coreg. CT scan of abdomen and pelvis shows no acute or inflammatory process identified in the abdomen or pelvis. Appendix remains normal. Healing right 7th through 10th rib fractures detailed last month. Chronic hepatic steatosis.  Mild aortic atherosclerosis.  She was treated with symptomatic care only and was able to tolerate a normal diet the morning after admission. Denied abdominal pain, nausea. She felt comfortable going home. She was discharged in stable condition and in her normal health.  All other chronic conditions were treated with home medications.   Discharge Condition: Good, improved Recommended discharge diet: Regular healthy diet  Consultations: None   Procedures/Studies: None   Allergies as of 03/04/2022   No Known Allergies      Medication List     STOP taking these medications    Belbuca 600 MCG Film Generic drug: Buprenorphine HCl   diphenhydrAMINE 25 MG tablet Commonly known as: BENADRYL   oxyCODONE-acetaminophen 7.5-325 MG tablet Commonly known as: PERCOCET       TAKE these medications    acetaminophen 325 MG tablet Commonly known as: TYLENOL Take 2 tablets (650 mg total) by mouth every 6 (six) hours as needed for mild pain, fever or headache. What changed: reasons to take this   amLODipine 10 MG tablet Commonly known as: NORVASC Take 1 tablet (10 mg total) by mouth daily.   ARIPiprazole 5 MG tablet Commonly known as: ABILIFY Take 1 tablet (5 mg total) by mouth daily.   atorvastatin 10 MG tablet Commonly known as: LIPITOR Take 10 mg by mouth daily.   carvedilol 12.5 MG tablet  Commonly known as: COREG Take 1 tablet (12.5 mg total) by mouth 2 (two) times daily with a meal.   FLUoxetine 40 MG capsule Commonly known as: PROZAC Take 1 capsule (40 mg total) by mouth daily.    HYDROcodone-acetaminophen 10-325 MG tablet Commonly known as: NORCO Take 1 tablet by mouth every 6 (six) hours as needed for severe pain.   levothyroxine 50 MCG tablet Commonly known as: SYNTHROID Take 50 mcg by mouth daily.   lidocaine 5 % ointment Commonly known as: XYLOCAINE Apply 1 Application topically daily as needed for mild pain.   lisinopril 10 MG tablet Commonly known as: ZESTRIL Take 1 tablet (10 mg total) by mouth daily.   metFORMIN 500 MG tablet Commonly known as: GLUCOPHAGE Take 500 mg by mouth 2 (two) times daily with a meal.   metoCLOPramide 5 MG tablet Commonly known as: REGLAN Take 5 mg by mouth 2 (two) times daily.   multivitamin Tabs tablet Take 1 tablet by mouth at bedtime.   ondansetron 4 MG disintegrating tablet Commonly known as: ZOFRAN-ODT Take 1 tablet (4 mg total) by mouth every 6 (six) hours as needed for nausea or vomiting.   pantoprazole 40 MG tablet Commonly known as: Protonix Take 1 tablet (40 mg total) by mouth daily.   prazosin 2 MG capsule Commonly known as: MINIPRESS Take 1 capsule (2 mg total) by mouth at bedtime.   promethazine 25 MG suppository Commonly known as: PHENERGAN Place 1 suppository (25 mg total) rectally every 6 (six) hours as needed for nausea or vomiting.   tiZANidine 4 MG tablet Commonly known as: ZANAFLEX Take 3 tablets (12 mg total) by mouth every 8 (eight) hours as needed for muscle spasms.   traZODone 50 MG tablet Commonly known as: DESYREL Take 3 tablets (150 mg total) by mouth at bedtime.        Follow-up Information     Schedule an appointment as soon as possible for a visit  with Ferd Hibbs, NP.   Specialty: Nurse Practitioner Contact information: [place]  [phone]                 Subjective   Pt reports good tolerance of breakfast and lunch. Denies abdominal pain, nausea, vomiting, diarrhea. All questions and concerns addressed at time of  encounter.   Objective  Blood pressure 120/74, pulse 72, temperature 98.4 F (36.9 C), resp. rate 17, height 5' 4.5" (1.638 m), weight 89.4 kg, last menstrual period 06/03/2018, SpO2 97 %.   General: Pt is alert, awake, not in acute distress Cardiovascular: RRR, S1/S2 +, no rubs, no gallops Respiratory: CTA bilaterally, no wheezing, no rhonchi Abdominal: Soft, NT, ND, bowel sounds + Extremities: no edema, no cyanosis  The results of significant diagnostics from this hospitalization (including imaging, microbiology, ancillary and laboratory) are listed below for reference.   Imaging studies: CT Angio Chest Pulmonary Embolism (PE) W or WO Contrast  Result Date: 03/03/2022 CLINICAL DATA:  High clinical suspicion for PE EXAM: CT ANGIOGRAPHY CHEST WITH CONTRAST TECHNIQUE: Multidetector CT imaging of the chest was performed using the standard protocol during bolus administration of intravenous contrast. Multiplanar CT image reconstructions and MIPs were obtained to evaluate the vascular anatomy. RADIATION DOSE REDUCTION: This exam was performed according to the departmental dose-optimization program which includes automated exposure control, adjustment of the mA and/or kV according to patient size and/or use of iterative reconstruction technique. CONTRAST:  57m OMNIPAQUE IOHEXOL 350 MG/ML SOLN COMPARISON:  02/03/2022 FINDINGS: Cardiovascular:  There are no intraluminal filling defects in pulmonary artery branches. There is homogeneous enhancement in thoracic aorta. Mediastinum/Nodes: No significant lymphadenopathy seen. Lungs/Pleura: There is no focal pulmonary consolidation. There is no pleural effusion or pneumothorax. Upper Abdomen: There is fatty infiltration in liver. High density in the lumen of gallbladder may be due to vicarious excretion of contrast administered earlier today. There is no wall thickening in gallbladder. There is no fluid around the gallbladder. There is small diaphragmatic  hernia in the medial aspect of left hemidiaphragm containing portion of stomach. This finding has not changed. Musculoskeletal: Healing fractures are seen in the right seventh through the tenth ribs. Review of the MIP images confirms the above findings. IMPRESSION: There is no evidence of pulmonary artery embolism. There is no evidence of thoracic aortic dissection. There is no focal pulmonary consolidation. Fatty liver. Small hernia is seen in the medial aspect of left hemidiaphragm with no significant interval change. Healing fractures in right lower ribs. Electronically Signed   By: Elmer Picker M.D.   On: 03/03/2022 14:51   CT Abdomen Pelvis W Contrast  Result Date: 03/03/2022 CLINICAL DATA:  51 year old female with acute abdominal pain, nausea vomiting. EXAM: CT ABDOMEN AND PELVIS WITH CONTRAST TECHNIQUE: Multidetector CT imaging of the abdomen and pelvis was performed using the standard protocol following bolus administration of intravenous contrast. RADIATION DOSE REDUCTION: This exam was performed according to the departmental dose-optimization program which includes automated exposure control, adjustment of the mA and/or kV according to patient size and/or use of iterative reconstruction technique. CONTRAST:  161m OMNIPAQUE IOHEXOL 300 MG/ML  SOLN COMPARISON:  CT Chest, Abdomen, and Pelvis today are reported separately. 02/03/2022 and earlier. FINDINGS: Lower chest: No cardiomegaly or pericardial effusion. No lung base pleural effusion, pneumothorax, or pulmonary opacity following right 7th through 10th rib fractures last month. Hepatobiliary: Stable hepatic steatosis. Gallbladder appears stable and negative. No bile duct enlargement. Pancreas: Stable partial pancreatic atrophy. Spleen: Negative. Adrenals/Urinary Tract: Stable and negative. Decompressed ureters. Symmetric renal contrast excretion on the delayed images. Stomach/Bowel: Decompressed large bowel throughout the abdomen and pelvis  with no convincing inflammation. Normal retrocecal appendix on series 2, image 64. No dilated small bowel. Terminal ileum remains within normal limits. Minimal gas and fluid distension of the stomach. Decompressed duodenum. No free air, free fluid, or mesenteric inflammation identified. Vascular/Lymphatic: Mild Calcified aortic atherosclerosis. Major arterial structures in the abdomen and pelvis remain patent. No lymphadenopathy. Portal venous system remains patent. Reproductive: Negative. Other: No pelvis free fluid. Musculoskeletal: Healing right anterior lower rib fractures detailed last month. Chronic lower thoracic and lumbar endplate compression versus Schmorl's nodes unchanged from at least June this year. Stable chronic pubic rami fractures more conspicuous on the right. No new osseous abnormality. IMPRESSION: 1. No acute or inflammatory process identified in the abdomen or pelvis. Appendix remains normal. 2. Healing right 7th through 10th rib fractures detailed last month. 3. Chronic hepatic steatosis.  Mild aortic atherosclerosis. Electronically Signed   By: HGenevie AnnM.D.   On: 03/03/2022 04:48   DG Chest 1 View  Result Date: 03/03/2022 CLINICAL DATA:  Chest pain, nausea EXAM: CHEST  1 VIEW COMPARISON:  02/04/2022 FINDINGS: Slightly improved lung volumes. Cardiac and mediastinal contours are within normal limits given AP technique and low lung volumes. No focal pulmonary opacity. No pleural effusion or pneumothorax. No acute osseous abnormality. IMPRESSION: No acute cardiopulmonary process. Electronically Signed   By: AMerilyn BabaM.D.   On: 03/03/2022 02:44    Labs: Basic  Metabolic Panel: Recent Labs  Lab 03/03/22 0318 03/04/22 0450  NA 140 141  K 3.6 3.3*  CL 106 113*  CO2 18* 22  GLUCOSE 162* 119*  BUN <5* <5*  CREATININE 0.97 0.72  CALCIUM 9.0 8.1*   CBC: Recent Labs  Lab 03/03/22 0318 03/04/22 0450  WBC 14.6* 7.8  HGB 14.9 12.1  HCT 46.1* 36.4  MCV 92.2 91.2  PLT 409*  272   Microbiology: Results for orders placed or performed during the hospital encounter of 03/03/22  Urine Culture     Status: Abnormal   Collection Time: 03/03/22  2:43 AM   Specimen: Urine, Clean Catch  Result Value Ref Range Status   Specimen Description   Final    URINE, CLEAN CATCH Performed at [facility], [address], [place], [place] [address]    Special Requests   Final    NONE Performed at [facility], [address] [place], [place] [address]    Culture (A)  Final    30,000 COLONIES/mL GROUP B STREP(S.AGALACTIAE)ISOLATED TESTING AGAINST S. AGALACTIAE NOT ROUTINELY PERFORMED DUE TO PREDICTABILITY OF AMP/PEN/VAN SUSCEPTIBILITY. Performed at [facility], [place] [address], [place], [place] [address]    Report Status 03/04/2022 FINAL  Final    Time coordinating discharge: Over 30 minutes  Richarda Osmond, MD  [facility] 03/06/2022, 3:29 PM

## 2022-03-21 ENCOUNTER — Inpatient Hospital Stay (HOSPITAL_COMMUNITY)
Admission: EM | Admit: 2022-03-21 | Discharge: 2022-03-25 | DRG: 392 | Disposition: A | Discharge status: Home / Self Care | Payer: Medicare Other | Attending: Internal Medicine | Admitting: Internal Medicine

## 2022-03-21 ENCOUNTER — Encounter (HOSPITAL_COMMUNITY): Payer: Self-pay | Admitting: Emergency Medicine

## 2022-03-21 ENCOUNTER — Other Ambulatory Visit: Payer: Self-pay

## 2022-03-21 ENCOUNTER — Emergency Department (HOSPITAL_COMMUNITY): Payer: Medicare Other

## 2022-03-21 DIAGNOSIS — Z8674 Personal history of sudden cardiac arrest: Secondary | ICD-10-CM

## 2022-03-21 DIAGNOSIS — Z87820 Personal history of traumatic brain injury: Secondary | ICD-10-CM

## 2022-03-21 DIAGNOSIS — N179 Acute kidney failure, unspecified: Secondary | ICD-10-CM | POA: Diagnosis present

## 2022-03-21 DIAGNOSIS — E876 Hypokalemia: Secondary | ICD-10-CM | POA: Diagnosis present

## 2022-03-21 DIAGNOSIS — K529 Noninfective gastroenteritis and colitis, unspecified: Secondary | ICD-10-CM | POA: Diagnosis not present

## 2022-03-21 DIAGNOSIS — E860 Dehydration: Secondary | ICD-10-CM | POA: Diagnosis not present

## 2022-03-21 DIAGNOSIS — E669 Obesity, unspecified: Secondary | ICD-10-CM | POA: Diagnosis present

## 2022-03-21 DIAGNOSIS — E611 Iron deficiency: Secondary | ICD-10-CM | POA: Diagnosis present

## 2022-03-21 DIAGNOSIS — F4310 Post-traumatic stress disorder, unspecified: Secondary | ICD-10-CM | POA: Diagnosis present

## 2022-03-21 DIAGNOSIS — G8929 Other chronic pain: Secondary | ICD-10-CM | POA: Diagnosis present

## 2022-03-21 DIAGNOSIS — F419 Anxiety disorder, unspecified: Secondary | ICD-10-CM | POA: Diagnosis present

## 2022-03-21 DIAGNOSIS — Z79899 Other long term (current) drug therapy: Secondary | ICD-10-CM

## 2022-03-21 DIAGNOSIS — R112 Nausea with vomiting, unspecified: Secondary | ICD-10-CM | POA: Diagnosis present

## 2022-03-21 DIAGNOSIS — F319 Bipolar disorder, unspecified: Secondary | ICD-10-CM | POA: Diagnosis present

## 2022-03-21 DIAGNOSIS — E1143 Type 2 diabetes mellitus with diabetic autonomic (poly)neuropathy: Secondary | ICD-10-CM | POA: Diagnosis present

## 2022-03-21 DIAGNOSIS — Z833 Family history of diabetes mellitus: Secondary | ICD-10-CM

## 2022-03-21 DIAGNOSIS — G931 Anoxic brain damage, not elsewhere classified: Secondary | ICD-10-CM | POA: Diagnosis present

## 2022-03-21 DIAGNOSIS — Z8659 Personal history of other mental and behavioral disorders: Secondary | ICD-10-CM

## 2022-03-21 DIAGNOSIS — Z7984 Long term (current) use of oral hypoglycemic drugs: Secondary | ICD-10-CM

## 2022-03-21 DIAGNOSIS — Z85828 Personal history of other malignant neoplasm of skin: Secondary | ICD-10-CM

## 2022-03-21 DIAGNOSIS — K3184 Gastroparesis: Secondary | ICD-10-CM | POA: Diagnosis present

## 2022-03-21 DIAGNOSIS — G894 Chronic pain syndrome: Secondary | ICD-10-CM | POA: Diagnosis present

## 2022-03-21 DIAGNOSIS — E785 Hyperlipidemia, unspecified: Secondary | ICD-10-CM | POA: Diagnosis present

## 2022-03-21 DIAGNOSIS — Z6833 Body mass index (BMI) 33.0-33.9, adult: Secondary | ICD-10-CM

## 2022-03-21 DIAGNOSIS — E119 Type 2 diabetes mellitus without complications: Secondary | ICD-10-CM

## 2022-03-21 DIAGNOSIS — E039 Hypothyroidism, unspecified: Secondary | ICD-10-CM | POA: Diagnosis present

## 2022-03-21 DIAGNOSIS — M545 Low back pain, unspecified: Secondary | ICD-10-CM | POA: Diagnosis present

## 2022-03-21 DIAGNOSIS — I10 Essential (primary) hypertension: Secondary | ICD-10-CM | POA: Diagnosis present

## 2022-03-21 DIAGNOSIS — M5030 Other cervical disc degeneration, unspecified cervical region: Secondary | ICD-10-CM | POA: Diagnosis present

## 2022-03-21 DIAGNOSIS — E872 Acidosis, unspecified: Secondary | ICD-10-CM | POA: Diagnosis present

## 2022-03-21 LAB — CBC WITH DIFFERENTIAL/PLATELET
Abs Immature Granulocytes: 0.09 10*3/uL — ABNORMAL HIGH (ref 0.00–0.07)
Basophils Absolute: 0.1 10*3/uL (ref 0.0–0.1)
Basophils Relative: 1 %
Eosinophils Absolute: 0.1 10*3/uL (ref 0.0–0.5)
Eosinophils Relative: 1 %
HCT: 48.8 % — ABNORMAL HIGH (ref 36.0–46.0)
Hemoglobin: 16.1 g/dL — ABNORMAL HIGH (ref 12.0–15.0)
Immature Granulocytes: 1 %
Lymphocytes Relative: 7 %
Lymphs Abs: 1.3 10*3/uL (ref 0.7–4.0)
MCH: 30.8 pg (ref 26.0–34.0)
MCHC: 33 g/dL (ref 30.0–36.0)
MCV: 93.3 fL (ref 80.0–100.0)
Monocytes Absolute: 0.8 10*3/uL (ref 0.1–1.0)
Monocytes Relative: 4 %
Neutro Abs: 17.4 10*3/uL — ABNORMAL HIGH (ref 1.7–7.7)
Neutrophils Relative %: 86 %
Platelets: 444 10*3/uL — ABNORMAL HIGH (ref 150–400)
RBC: 5.23 MIL/uL — ABNORMAL HIGH (ref 3.87–5.11)
RDW: 14 % (ref 11.5–15.5)
WBC: 19.8 10*3/uL — ABNORMAL HIGH (ref 4.0–10.5)
nRBC: 0 % (ref 0.0–0.2)

## 2022-03-21 LAB — CBG MONITORING, ED: Glucose-Capillary: 159 mg/dL — ABNORMAL HIGH (ref 70–99)

## 2022-03-21 LAB — COMPREHENSIVE METABOLIC PANEL
ALT: 28 U/L (ref 0–44)
AST: 49 U/L — ABNORMAL HIGH (ref 15–41)
Albumin: 3.4 g/dL — ABNORMAL LOW (ref 3.5–5.0)
Alkaline Phosphatase: 110 U/L (ref 38–126)
Anion gap: 19 — ABNORMAL HIGH (ref 5–15)
BUN: 13 mg/dL (ref 6–20)
CO2: 20 mmol/L — ABNORMAL LOW (ref 22–32)
Calcium: 9.4 mg/dL (ref 8.9–10.3)
Chloride: 102 mmol/L (ref 98–111)
Creatinine, Ser: 1.26 mg/dL — ABNORMAL HIGH (ref 0.44–1.00)
GFR, Estimated: 52 mL/min — ABNORMAL LOW (ref >60)
Glucose, Bld: 152 mg/dL — ABNORMAL HIGH (ref 70–99)
Potassium: 3.6 mmol/L (ref 3.5–5.1)
Sodium: 141 mmol/L (ref 135–145)
Total Bilirubin: 0.7 mg/dL (ref 0.3–1.2)
Total Protein: 8.2 g/dL — ABNORMAL HIGH (ref 6.5–8.1)

## 2022-03-21 LAB — LIPASE, BLOOD: Lipase: 24 U/L (ref 11–51)

## 2022-03-21 LAB — LACTIC ACID, PLASMA
Lactic Acid, Venous: 4.7 mmol/L (ref 0.5–1.9)
Lactic Acid, Venous: 6.5 mmol/L (ref 0.5–1.9)

## 2022-03-21 MED ORDER — ONDANSETRON 8 MG PO TBDP
8.0000 mg | ORAL_TABLET | Freq: Once | ORAL | Status: AC
  Administered 2022-03-21: 8 mg via ORAL
  Filled 2022-03-21: qty 1

## 2022-03-21 MED ORDER — METOCLOPRAMIDE HCL 5 MG/ML IJ SOLN
10.0000 mg | Freq: Once | INTRAMUSCULAR | Status: AC
  Administered 2022-03-21: 10 mg via INTRAVENOUS
  Filled 2022-03-21: qty 2

## 2022-03-21 MED ORDER — LACTATED RINGERS IV BOLUS
1000.0000 mL | Freq: Once | INTRAVENOUS | Status: AC
  Administered 2022-03-21: 1000 mL via INTRAVENOUS

## 2022-03-21 MED ORDER — MORPHINE SULFATE (PF) 4 MG/ML IV SOLN
4.0000 mg | Freq: Once | INTRAVENOUS | Status: AC
  Administered 2022-03-21: 4 mg via INTRAVENOUS
  Filled 2022-03-21: qty 1

## 2022-03-21 MED ORDER — LACTATED RINGERS IV SOLN: INTRAVENOUS | Status: DC

## 2022-03-21 MED ORDER — PROCHLORPERAZINE EDISYLATE 10 MG/2ML IJ SOLN
10.0000 mg | Freq: Once | INTRAMUSCULAR | Status: AC
  Administered 2022-03-22: 10 mg via INTRAVENOUS
  Filled 2022-03-21: qty 2

## 2022-03-21 MED ORDER — PIPERACILLIN-TAZOBACTAM 3.375 G IVPB 30 MIN
3.3750 g | Freq: Four times a day (QID) | INTRAVENOUS | Status: DC
  Administered 2022-03-22: 3.375 g via INTRAVENOUS
  Filled 2022-03-21: qty 50

## 2022-03-21 MED ORDER — IOHEXOL 300 MG/ML  SOLN
100.0000 mL | Freq: Once | INTRAMUSCULAR | Status: AC | PRN
  Administered 2022-03-21: 100 mL via INTRAVENOUS

## 2022-03-21 NOTE — ED Provider Triage Note (Signed)
Emergency Medicine Provider Triage Evaluation Note  [name] , a 51 y.o. female  was evaluated in triage.  Pt complains of nausea, vomiting, abdominal pain.  Patient reports history of similar episodes in the past and states this feels the exact same.  She notes acute onset of symptoms around 8 AM this morning.  Has been unable to take any of her medicines.  Denies alcohol, marijuana use.  Denies hematemesis, fever, chest pain, shortness of breath, urinary/vaginal thoughts, change in bowel habits..  Review of Systems  Positive: See above Negative:   Physical Exam  BP (!) 147/122   Pulse (!) 138   Temp 98.3 F (36.8 C) (Oral)   Resp 17   Ht 5' 4.5" (1.638 m)   Wt 89.4 kg   LMP 06/03/2018 (Approximate) Comment: neg hcg 05/10/20  SpO2 96%   BMI 33.29 kg/m  Gen:   Awake, no distress   Resp:  Normal effort  MSK:   Moves extremities without difficulty  Other:  Diffuse abdominal tendernes  Medical Decision Making  Medically screening exam initiated at 5:08 PM.  Appropriate orders placed.  MICALA SALTSMAN was informed that the remainder of the evaluation will be completed by another provider, this initial triage assessment does not replace that evaluation, and the importance of remaining in the ED until their evaluation is complete.     Wilnette Kales, [place] 03/21/22 1708

## 2022-03-21 NOTE — ED Notes (Signed)
IV team at bedside 

## 2022-03-21 NOTE — ED Triage Notes (Signed)
Patient arrives ambulatory by POV c/o nausea, vomiting and diarrhea onset of 8am this morning. C/o mid abdominal pain and dysuria.

## 2022-03-21 NOTE — ED Provider Notes (Signed)
[name] Provider Note   CSN: [id number] Arrival date & time: 03/21/22  1622     History  Chief Complaint  Patient presents with   Emesis    [name] is a 51 y.o. female with T2DM, hypothyroidism, bipolar disorder, PTSD, endometriosis, HTN, IDA, history of cardiac arrest likely secondary to opiate overdose in February 2022 with resultant anoxic brain injury, chronic pain syndrome with drug-seeking behavior, obesity, cyclic vomiting syndrome, LFT elevations, and substance abuse who presents with emesis.   P/w N/V, inability to take PO, and generalized abdominal pain. Patient reports history of similar episodes in the past and states this feels the exact same.  She notes acute onset of symptoms around 8 AM this morning.  Has been unable to take any of her medicines.  Denies alcohol, marijuana use.  Denies hematemesis, fever, chest pain, shortness of breath, urinary/vaginal symptoms, change in bowel habits, melena/hematochezia. She states that this is the exact same pain she had before.   Per chart review, echo in 2022 w/ LVE 55-60%.  Was recently admitted from 03/03/2022 to 03/04/2022 for refractory nausea and vomiting with several hospitalizations for similar episodes in the past.  Had upper endoscopy on 10/05/2021 which showed small hiatal hernia and patchy moderate inflammation characterized by edema, erythema in gastric antrum.  During this admission patient was treated symptomatically only, thought to have possible diabetic gastroparesis and treated with IV Reglan and IV fluids, and tolerated normal diet the morning after admission, discharged home in stable condition.   Emesis      Home Medications Prior to Admission medications   Medication Sig Start Date End Date Taking? Authorizing Provider  acetaminophen (TYLENOL) 325 MG tablet Take 2 tablets (650 mg total) by mouth every 6 (six) hours as needed for mild pain, fever or  headache. Patient taking differently: Take 650 mg by mouth every 6 (six) hours as needed for mild pain. 07/01/20   Angiulli, Mcarthur Rossetti, PA-C  amLODipine (NORVASC) 10 MG tablet Take 1 tablet (10 mg total) by mouth daily. 10/07/21   Burnadette Pop, MD  ARIPiprazole (ABILIFY) 5 MG tablet Take 1 tablet (5 mg total) by mouth daily. 08/01/20   Marcello Fennel, MD  atorvastatin (LIPITOR) 10 MG tablet Take 10 mg by mouth daily. 08/26/20   [provider]  carvedilol (COREG) 12.5 MG tablet Take 1 tablet (12.5 mg total) by mouth 2 (two) times daily with a meal. 10/06/21   Burnadette Pop, MD  FLUoxetine (PROZAC) 40 MG capsule Take 1 capsule (40 mg total) by mouth daily. 07/01/20   Angiulli, Mcarthur Rossetti, PA-C  HYDROcodone-acetaminophen (NORCO) 10-325 MG tablet Take 1 tablet by mouth every 6 (six) hours as needed for severe pain. 02/09/22   [provider]  levothyroxine (SYNTHROID) 50 MCG tablet Take 50 mcg by mouth daily.    [provider]  lidocaine (XYLOCAINE) 5 % ointment Apply 1 Application topically daily as needed for mild pain. 02/26/22   [provider]  lisinopril (ZESTRIL) 10 MG tablet Take 1 tablet (10 mg total) by mouth daily. 10/06/21 10/06/22  Burnadette Pop, MD  metFORMIN (GLUCOPHAGE) 500 MG tablet Take 500 mg by mouth 2 (two) times daily with a meal.    [provider]  metoCLOPramide (REGLAN) 5 MG tablet Take 5 mg by mouth 2 (two) times daily. 01/01/22   [provider]  multivitamin (RENA-VIT) TABS tablet Take 1 tablet by mouth at bedtime. 08/01/20   Marcello Fennel, MD  ondansetron (ZOFRAN-ODT) 4 MG disintegrating tablet Take 1 tablet (4 mg total) by mouth every 6 (six) hours as needed for nausea or vomiting. 11/08/21   Meredith Pel, NP  pantoprazole (PROTONIX) 40 MG tablet Take 1 tablet (40 mg total) by mouth daily. 09/21/21 09/21/22  Zigmund Daniel., MD  prazosin (MINIPRESS) 2 MG capsule Take 1 capsule (2 mg total) by mouth at bedtime.  08/01/20   Marcello Fennel, MD  promethazine (PHENERGAN) 25 MG suppository Place 1 suppository (25 mg total) rectally every 6 (six) hours as needed for nausea or vomiting. 12/27/21   Charlynne Pander, MD  tiZANidine (ZANAFLEX) 4 MG tablet Take 3 tablets (12 mg total) by mouth every 8 (eight) hours as needed for muscle spasms. 12/27/21   Charlynne Pander, MD  traZODone (DESYREL) 50 MG tablet Take 3 tablets (150 mg total) by mouth at bedtime. 09/21/21   Zigmund Daniel., MD      Allergies    Patient has no known allergies.    Review of Systems   Review of Systems  Gastrointestinal:  Positive for vomiting.   Review of systems Negative for fever.  A 10 point review of systems was performed and is negative unless otherwise reported in HPI.  Physical Exam Updated Vital Signs BP (!) 148/118   Pulse (!) 127   Temp 98.4 F (36.9 C) (Oral)   Resp 12   Ht 5' 4.5" (1.638 m)   Wt 89.4 kg   LMP 06/03/2018 (Approximate) Comment: neg hcg 05/10/20  SpO2 99%   BMI 33.29 kg/m  Physical Exam General: Uncomfortable appearing female, lying in bed.  HEENT:  Sclera anicteric, MMM, trachea midline.  Cardiology: Tachycardic regular rate, no murmurs/rubs/gallops. BL radial and DP pulses equal bilaterally.  Resp: Normal respiratory rate and effort. CTAB, no wheezes, rhonchi, crackles.  Abd: Diffusely mildly tender to palpation, no focal tenderness. Soft, non-distended. No rebound tenderness or guarding.  GU: Deferred. MSK: No peripheral edema or signs of trauma. Skin: warm, dry. No rashes or lesions. Back: No CVA tenderness Neuro: A&Ox4, CNs II-XII grossly intact. MAEs. Sensation grossly intact.  Psych: Normal mood and affect.   ED Results / Procedures / Treatments   Labs (all labs ordered are listed, but only abnormal results are displayed) Labs Reviewed  CBC WITH DIFFERENTIAL/PLATELET - Abnormal; Notable for the following components:      Result Value   WBC 19.8 (*)    RBC 5.23 (*)     Hemoglobin 16.1 (*)    HCT 48.8 (*)    Platelets 444 (*)    All other components within normal limits  LACTIC ACID, PLASMA - Abnormal; Notable for the following components:   Lactic Acid, Venous 4.7 (*)    All other components within normal limits  URINALYSIS, ROUTINE W REFLEX MICROSCOPIC  RAPID URINE DRUG SCREEN, HOSP PERFORMED  LACTIC ACID, PLASMA  COMPREHENSIVE METABOLIC PANEL  LIPASE, BLOOD    EKG EKG Interpretation  Date/Time:  Wednesday March 21 2022 17:17:25 EST Ventricular Rate:  137 PR Interval:  137 QRS Duration: 77 QT Interval:  299 QTC Calculation: 452 R Axis:   6 Text Interpretation: Sinus tachycardia Baseline wander in lead(s) V1 Confirmed by Vivi Barrack [phone]) on 03/21/2022 5:40:04 PM  Radiology No results found.  Procedures .Critical Care  Performed by: Loetta Rough, MD Authorized by: Loetta Rough, MD   Critical care provider statement:    Critical care time (minutes):  60   Critical  care was necessary to treat or prevent imminent or life-threatening deterioration of the following conditions:  Dehydration, shock and sepsis   Critical care was time spent personally by me on the following activities:  Development of treatment plan with patient or surrogate, discussions with consultants, evaluation of patient's response to treatment, examination of patient, ordering and review of laboratory studies, ordering and review of radiographic studies, ordering and performing treatments and interventions, pulse oximetry, re-evaluation of patient's condition, review of old charts and obtaining history from patient or surrogate   Care discussed with: admitting provider       Medications Ordered in ED Medications  lactated ringers bolus 1,000 mL (has no administration in time range)  ondansetron (ZOFRAN-ODT) disintegrating tablet 8 mg (8 mg Oral Given 03/21/22 1727)  lactated ringers bolus 1,000 mL (1,000 mLs Intravenous New Bag/Given 03/21/22 1936)   metoCLOPramide (REGLAN) injection 10 mg (10 mg Intravenous Given 03/21/22 1930)    ED Course/ Medical Decision Making/ A&P                          Medical Decision Making Amount and/or Complexity of Data Reviewed Labs: ordered. Decision-making details documented in ED Course. Radiology: ordered.  Risk Prescription drug management. Decision regarding hospitalization.   MDM  Patient is tachycardic into 130s initially, afebrile, satting well on RA. She is actively vomiting on my evaluation.   Patient states that she has had this abdominal pain before and per chart review was admitted recently for the same. She had a negative CT abdomen at that time, she has had many CTs, 8 since April of this year. She is diffusely tender in her belly but no focal tenderness, will treat with antiemetics, IV analgesia, and fluids to start and reevaluate the need for imaging.  During her most recent admission the thought was that her refractory nausea vomiting is probably secondary to diabetic gastroparesis.  She has also had an upper endoscopy on 10/05/2021 which showed a small hiatal hernia and inflammation in the gastric antrum.  She was treated symptomatically only was able to tolerate a normal diet and sent home.  Plan was to follow-up GI as an outpatient.   At this time her abdominal exam is without peritoneal signs. No evidence of acute abdomen at this time. Lower suspicion for acute hepatobiliary disease (including acute cholecystitis or cholangitis), acute pancreatitis, acute appendicitis, vascular catastrophe, bowel obstruction, viscus perforation, or diverticulitis. Also consider UTI/pyelonephritis. Lower c/f gynecologic pathology without vaginal symptoms. Will treat her symptomatically as has happened in the past and will reevaluate.   Labs demonstrate initial lactate of 4.7 and WBC of 19.8, hemoglobin 16.1.  This is consistent with dehydration and especially in the clinical context of her nausea  vomiting will robustly fluid resuscitate.  8 mg of PO Zofran given in triage did not help, will give IV Reglan and morphine for pain control.   Clinical Course as of 03/28/22 1525  Wed Mar 21, 2022  2001 Lactic Acid, Venous(!!): 4.7 [HN]  2001 WBC(!): 19.8 [HN]  2001 Hemoglobin(!): 16.1 [HN]  2311 Lactic Acid, Venous(!!): 6.5 Increasing lactate.  Patient received a total of 2 L of fluid at this point of fluid resuscitation.  With a white count, severe abdominal pain and rising lactate will obtain a CT scan of her belly even though patient states she has had this pain before. Consider intraabdominal infection, acute mesenteric ischemia.  Patient also with AKI and has been unable to void  here in the emergency department.  Will place a Foley and obtain a urine. [HN]    Clinical Course User Index [HN] Loetta Rough, MD   Patient still tachycardic despite 2L fluid boluses. With increased white count and rising lactate, concern for sepsis/SIRS and will treat with broad spectrum zosyn and draw blood cultures. Patient's CT demonstrates transverse colitis not likely ischemic, likely infectious/inflammatory. Patient is ordered for additional fluids and analgesia and consulted to medicine for admission.   Labs: I Ordered, and personally interpreted labs.  The pertinent results include those listed above  Imaging Studies ordered: I ordered imaging studies including CT A/P which demonstrated colitis I independently visualized and interpreted imaging. I agree with the radiologist interpretation  Additional history obtained from chart review.    Cardiac Monitoring: The patient was maintained on a cardiac monitor.  I personally viewed and interpreted the cardiac monitored which showed an underlying rhythm of: sinus tachycardia  Reevaluation: After the interventions noted above, I reevaluated the patient and found that they have :stayed the same  Social Determinants of Health: Patient lives  independently  Disposition:  Admit to hospital for colitis, dehydration, N/V and abdominal pain  Co morbidities that complicate the patient evaluation  Past Medical History:  Diagnosis Date   Anxiety    Arthritis    "joints ache all over" (10/15/2014)   Barrett's esophagus    Bulging lumbar disc    Chronic lower back pain    DDD (degenerative disc disease), cervical    Depression    Drug-seeking behavior    Headache    "weekly" (10/15/2014)   Hyperlipemia    Hypertension    PTSD (post-traumatic stress disorder)    Skin cancer    "had them cut off my arms; don't know what kind"     Medicines Meds ordered this encounter  Medications   ondansetron (ZOFRAN-ODT) disintegrating tablet 8 mg   lactated ringers bolus 1,000 mL   metoCLOPramide (REGLAN) injection 10 mg   lactated ringers bolus 1,000 mL   morphine (PF) 4 MG/ML injection 4 mg   DISCONTD: piperacillin-tazobactam (ZOSYN) IVPB 3.375 g    Order Specific Question:   Antibiotic Indication:    Answer:   Sepsis   DISCONTD: lactated ringers infusion   iohexol (OMNIPAQUE) 300 MG/ML solution 100 mL   prochlorperazine (COMPAZINE) injection 10 mg   morphine (PF) 4 MG/ML injection 4 mg   DISCONTD: ondansetron (ZOFRAN) injection 4 mg   lactated ringers bolus 500 mL   metoCLOPramide (REGLAN) injection 10 mg   DISCONTD: piperacillin-tazobactam (ZOSYN) IVPB 3.375 g    Order Specific Question:   Antibiotic Indication:    Answer:   Sepsis   DISCONTD: sodium chloride 0.9 % 500 mL IV infusion   sodium chloride 0.9 % bolus 500 mL   DISCONTD: ARIPiprazole (ABILIFY) tablet 5 mg   DISCONTD: FLUoxetine (PROZAC) capsule 40 mg   DISCONTD: levothyroxine (SYNTHROID) tablet 50 mcg   DISCONTD: insulin aspart (novoLOG) injection 0-15 Units    Order Specific Question:   Correction coverage:    Answer:   Moderate (average weight, post-op)    Order Specific Question:   CBG < 70:    Answer:   Implement Hypoglycemia Standing Orders and refer to  Hypoglycemia Standing Orders sidebar report    Order Specific Question:   CBG 70 - 120:    Answer:   0 units    Order Specific Question:   CBG 121 - 150:    Answer:  2 units    Order Specific Question:   CBG 151 - 200:    Answer:   3 units    Order Specific Question:   CBG 201 - 250:    Answer:   5 units    Order Specific Question:   CBG 251 - 300:    Answer:   8 units    Order Specific Question:   CBG 301 - 350:    Answer:   11 units    Order Specific Question:   CBG 351 - 400:    Answer:   15 units    Order Specific Question:   CBG > 400    Answer:   call MD and obtain STAT lab verification   DISCONTD: insulin aspart (novoLOG) injection 0-5 Units    Order Specific Question:   Correction coverage:    Answer:   HS scale    Order Specific Question:   CBG < 70:    Answer:   Implement Hypoglycemia Standing Orders and refer to Hypoglycemia Standing Orders sidebar report    Order Specific Question:   CBG 70 - 120:    Answer:   0 units    Order Specific Question:   CBG 121 - 150:    Answer:   0 units    Order Specific Question:   CBG 151 - 200:    Answer:   0 units    Order Specific Question:   CBG 201 - 250:    Answer:   2 units    Order Specific Question:   CBG 251 - 300:    Answer:   3 units    Order Specific Question:   CBG 301 - 350:    Answer:   4 units    Order Specific Question:   CBG 351 - 400:    Answer:   5 units    Order Specific Question:   CBG > 400    Answer:   call MD and obtain STAT lab verification   DISCONTD: HYDROmorphone (DILAUDID) injection 1 mg   DISCONTD: enoxaparin (LOVENOX) injection 40 mg   DISCONTD: insulin aspart (novoLOG) injection 0-15 Units    Order Specific Question:   Correction coverage:    Answer:   Moderate (average weight, post-op)    Order Specific Question:   CBG < 70:    Answer:   Implement Hypoglycemia Standing Orders and refer to Hypoglycemia Standing Orders sidebar report    Order Specific Question:   CBG 70 - 120:    Answer:   0  units    Order Specific Question:   CBG 121 - 150:    Answer:   2 units    Order Specific Question:   CBG 151 - 200:    Answer:   3 units    Order Specific Question:   CBG 201 - 250:    Answer:   5 units    Order Specific Question:   CBG 251 - 300:    Answer:   8 units    Order Specific Question:   CBG 301 - 350:    Answer:   11 units    Order Specific Question:   CBG 351 - 400:    Answer:   15 units    Order Specific Question:   CBG > 400    Answer:   call MD and obtain STAT lab verification   DISCONTD: HYDROmorphone (DILAUDID) injection 2 mg   DISCONTD: HYDROmorphone (DILAUDID) injection 2  mg   potassium chloride 10 mEq in 100 mL IVPB   metoprolol tartrate (LOPRESSOR) injection 5 mg   DISCONTD: metoprolol tartrate (LOPRESSOR) injection 10 mg   DISCONTD: tiZANidine (ZANAFLEX) tablet 4 mg   DISCONTD: 0.9 %  sodium chloride infusion   prochlorperazine (COMPAZINE) injection 10 mg   DISCONTD: Chlorhexidine Gluconate Cloth 2 % PADS 6 each   DISCONTD: levothyroxine (SYNTHROID) tablet 50 mcg   magnesium sulfate IVPB 2 g 50 mL   DISCONTD: amLODipine (NORVASC) tablet 10 mg   DISCONTD: carvedilol (COREG) tablet 12.5 mg   DISCONTD: prazosin (MINIPRESS) capsule 2 mg   DISCONTD: metoprolol tartrate (LOPRESSOR) injection 2.5 mg   DISCONTD: traZODone (DESYREL) tablet 150 mg   DISCONTD: ARIPiprazole (ABILIFY) tablet 5 mg   DISCONTD: metoCLOPramide (REGLAN) injection 10 mg   DISCONTD: HYDROmorphone (DILAUDID) injection 0.5-1 mg   DISCONTD: pantoprazole (PROTONIX) injection 40 mg   DISCONTD: polyethylene glycol (MIRALAX / GLYCOLAX) packet 17 g   DISCONTD: polyethylene glycol (MIRALAX / GLYCOLAX) packet 17 g   potassium chloride SA (KLOR-CON M) CR tablet 40 mEq   HYDROcodone-acetaminophen (NORCO) 10-325 MG tablet    Sig: Take 1 tablet by mouth every 6 (six) hours as needed for severe pain.    Dispense:  15 tablet    Refill:  0   amLODipine (NORVASC) 10 MG tablet    Sig: Take 1 tablet (10 mg  total) by mouth daily. Hold for next 2 days    Dispense:  30 tablet    Refill:  1   lisinopril (ZESTRIL) 10 MG tablet    Sig: Take 1 tablet (10 mg total) by mouth daily. HOLD until follow-up with your doctor    Dispense:  30 tablet    Refill:  1   metoCLOPramide (REGLAN) 5 MG tablet    Sig: Take 1 tablet (5 mg total) by mouth 3 (three) times daily before meals.    Dispense:  90 tablet    Refill:  0   ondansetron (ZOFRAN-ODT) 8 MG disintegrating tablet    Sig: Take 1 tablet (8 mg total) by mouth every 8 (eight) hours as needed for nausea or vomiting.    Dispense:  20 tablet    Refill:  0   tiZANidine (ZANAFLEX) 4 MG tablet    Sig: Take 3 tablets (12 mg total) by mouth every 8 (eight) hours as needed for muscle spasms.    Dispense:  60 tablet    Refill:  0    I have reviewed the patients home medicines and have made adjustments as needed  Problem List / ED Course: Problem List Items Addressed This Visit       Digestive   Nausea with vomiting - Primary   Acute colitis   Relevant Orders   Diet Carb Modified     Other   Dehydration           This note was created using dictation software, which may contain spelling or grammatical errors.    Loetta Rough, MD 03/29/22 [phone]

## 2022-03-21 NOTE — ED Notes (Signed)
Attempted IV x 2 unsuccessful.

## 2022-03-21 NOTE — ED Notes (Signed)
Patient requesting IV for lab draw. Triage RN made aware.

## 2022-03-21 NOTE — ED Notes (Signed)
Patient reports she is not feeling any better.  She continues to have nausea and pain.   She continues to have elevated bp.  HR has improved to 114

## 2022-03-22 ENCOUNTER — Inpatient Hospital Stay (HOSPITAL_COMMUNITY): Payer: Medicare Other

## 2022-03-22 DIAGNOSIS — K529 Noninfective gastroenteritis and colitis, unspecified: Secondary | ICD-10-CM | POA: Diagnosis present

## 2022-03-22 DIAGNOSIS — Z833 Family history of diabetes mellitus: Secondary | ICD-10-CM | POA: Diagnosis not present

## 2022-03-22 DIAGNOSIS — E1100 Type 2 diabetes mellitus with hyperosmolarity without nonketotic hyperglycemic-hyperosmolar coma (NKHHC): Secondary | ICD-10-CM

## 2022-03-22 DIAGNOSIS — I10 Essential (primary) hypertension: Secondary | ICD-10-CM | POA: Diagnosis present

## 2022-03-22 DIAGNOSIS — R112 Nausea with vomiting, unspecified: Secondary | ICD-10-CM | POA: Diagnosis not present

## 2022-03-22 DIAGNOSIS — E785 Hyperlipidemia, unspecified: Secondary | ICD-10-CM | POA: Diagnosis present

## 2022-03-22 DIAGNOSIS — F4310 Post-traumatic stress disorder, unspecified: Secondary | ICD-10-CM | POA: Diagnosis present

## 2022-03-22 DIAGNOSIS — G931 Anoxic brain damage, not elsewhere classified: Secondary | ICD-10-CM | POA: Diagnosis not present

## 2022-03-22 DIAGNOSIS — Z85828 Personal history of other malignant neoplasm of skin: Secondary | ICD-10-CM | POA: Diagnosis not present

## 2022-03-22 DIAGNOSIS — Z7984 Long term (current) use of oral hypoglycemic drugs: Secondary | ICD-10-CM | POA: Diagnosis not present

## 2022-03-22 DIAGNOSIS — F319 Bipolar disorder, unspecified: Secondary | ICD-10-CM | POA: Diagnosis present

## 2022-03-22 DIAGNOSIS — M545 Low back pain, unspecified: Secondary | ICD-10-CM | POA: Diagnosis present

## 2022-03-22 DIAGNOSIS — Z79899 Other long term (current) drug therapy: Secondary | ICD-10-CM | POA: Diagnosis not present

## 2022-03-22 DIAGNOSIS — E860 Dehydration: Secondary | ICD-10-CM

## 2022-03-22 DIAGNOSIS — K3184 Gastroparesis: Secondary | ICD-10-CM | POA: Diagnosis present

## 2022-03-22 DIAGNOSIS — E876 Hypokalemia: Secondary | ICD-10-CM | POA: Diagnosis present

## 2022-03-22 DIAGNOSIS — E039 Hypothyroidism, unspecified: Secondary | ICD-10-CM

## 2022-03-22 DIAGNOSIS — Z6833 Body mass index (BMI) 33.0-33.9, adult: Secondary | ICD-10-CM | POA: Diagnosis not present

## 2022-03-22 DIAGNOSIS — E669 Obesity, unspecified: Secondary | ICD-10-CM | POA: Diagnosis present

## 2022-03-22 DIAGNOSIS — Z8674 Personal history of sudden cardiac arrest: Secondary | ICD-10-CM | POA: Diagnosis not present

## 2022-03-22 DIAGNOSIS — G894 Chronic pain syndrome: Secondary | ICD-10-CM | POA: Diagnosis present

## 2022-03-22 DIAGNOSIS — E872 Acidosis, unspecified: Secondary | ICD-10-CM | POA: Diagnosis present

## 2022-03-22 DIAGNOSIS — M5030 Other cervical disc degeneration, unspecified cervical region: Secondary | ICD-10-CM | POA: Diagnosis present

## 2022-03-22 DIAGNOSIS — E1143 Type 2 diabetes mellitus with diabetic autonomic (poly)neuropathy: Secondary | ICD-10-CM | POA: Diagnosis present

## 2022-03-22 DIAGNOSIS — N179 Acute kidney failure, unspecified: Secondary | ICD-10-CM | POA: Diagnosis present

## 2022-03-22 DIAGNOSIS — F419 Anxiety disorder, unspecified: Secondary | ICD-10-CM | POA: Diagnosis present

## 2022-03-22 LAB — BASIC METABOLIC PANEL
Anion gap: 15 (ref 5–15)
BUN: 8 mg/dL (ref 6–20)
CO2: 21 mmol/L — ABNORMAL LOW (ref 22–32)
Calcium: 8.7 mg/dL — ABNORMAL LOW (ref 8.9–10.3)
Chloride: 102 mmol/L (ref 98–111)
Creatinine, Ser: 0.89 mg/dL (ref 0.44–1.00)
GFR, Estimated: >60 mL/min (ref >60)
Glucose, Bld: 166 mg/dL — ABNORMAL HIGH (ref 70–99)
Potassium: 3.2 mmol/L — ABNORMAL LOW (ref 3.5–5.1)
Sodium: 138 mmol/L (ref 135–145)

## 2022-03-22 LAB — CBC WITH DIFFERENTIAL/PLATELET
Abs Immature Granulocytes: 0.19 10*3/uL — ABNORMAL HIGH (ref 0.00–0.07)
Basophils Absolute: 0.1 10*3/uL (ref 0.0–0.1)
Basophils Relative: 0 %
Eosinophils Absolute: 0 10*3/uL (ref 0.0–0.5)
Eosinophils Relative: 0 %
HCT: 40.9 % (ref 36.0–46.0)
Hemoglobin: 13.6 g/dL (ref 12.0–15.0)
Immature Granulocytes: 1 %
Lymphocytes Relative: 6 %
Lymphs Abs: 1.2 10*3/uL (ref 0.7–4.0)
MCH: 30.7 pg (ref 26.0–34.0)
MCHC: 33.3 g/dL (ref 30.0–36.0)
MCV: 92.3 fL (ref 80.0–100.0)
Monocytes Absolute: 0.8 10*3/uL (ref 0.1–1.0)
Monocytes Relative: 4 %
Neutro Abs: 18.1 10*3/uL — ABNORMAL HIGH (ref 1.7–7.7)
Neutrophils Relative %: 89 %
Platelets: 397 10*3/uL (ref 150–400)
RBC: 4.43 MIL/uL (ref 3.87–5.11)
RDW: 14 % (ref 11.5–15.5)
WBC: 20.5 10*3/uL — ABNORMAL HIGH (ref 4.0–10.5)
nRBC: 0 % (ref 0.0–0.2)

## 2022-03-22 LAB — CBG MONITORING, ED
Glucose-Capillary: 143 mg/dL — ABNORMAL HIGH (ref 70–99)
Glucose-Capillary: 133 mg/dL — ABNORMAL HIGH (ref 70–99)
Glucose-Capillary: 115 mg/dL — ABNORMAL HIGH (ref 70–99)

## 2022-03-22 LAB — RAPID URINE DRUG SCREEN, HOSP PERFORMED
Amphetamines: POSITIVE — AB
Barbiturates: NOT DETECTED
Benzodiazepines: NOT DETECTED
Cocaine: NOT DETECTED
Opiates: POSITIVE — AB
Tetrahydrocannabinol: NOT DETECTED

## 2022-03-22 LAB — TROPONIN I (HIGH SENSITIVITY)
Troponin I (High Sensitivity): 43 ng/L — ABNORMAL HIGH (ref <18)
Troponin I (High Sensitivity): 46 ng/L — ABNORMAL HIGH (ref <18)

## 2022-03-22 LAB — LACTIC ACID, PLASMA
Lactic Acid, Venous: 4.1 mmol/L (ref 0.5–1.9)
Lactic Acid, Venous: 3.3 mmol/L (ref 0.5–1.9)

## 2022-03-22 LAB — GLUCOSE, CAPILLARY
Glucose-Capillary: 90 mg/dL (ref 70–99)
Glucose-Capillary: 101 mg/dL — ABNORMAL HIGH (ref 70–99)

## 2022-03-22 MED ORDER — LEVOTHYROXINE SODIUM 50 MCG PO TABS
50.0000 ug | ORAL_TABLET | Freq: Every day | ORAL | Status: DC
  Filled 2022-03-22: qty 1

## 2022-03-22 MED ORDER — PIPERACILLIN-TAZOBACTAM 3.375 G IVPB
3.3750 g | Freq: Three times a day (TID) | INTRAVENOUS | Status: DC
  Administered 2022-03-22 – 2022-03-24 (×7): 3.375 g via INTRAVENOUS
  Filled 2022-03-22 (×7): qty 50

## 2022-03-22 MED ORDER — POTASSIUM CHLORIDE 10 MEQ/100ML IV SOLN
10.0000 meq | INTRAVENOUS | Status: AC
  Administered 2022-03-22 (×4): 10 meq via INTRAVENOUS
  Filled 2022-03-22 (×4): qty 100

## 2022-03-22 MED ORDER — METOPROLOL TARTRATE 5 MG/5ML IV SOLN
10.0000 mg | Freq: Four times a day (QID) | INTRAVENOUS | Status: DC
  Administered 2022-03-22 – 2022-03-23 (×3): 10 mg via INTRAVENOUS
  Filled 2022-03-22 (×4): qty 10

## 2022-03-22 MED ORDER — MORPHINE SULFATE (PF) 4 MG/ML IV SOLN
4.0000 mg | Freq: Once | INTRAVENOUS | Status: AC
  Administered 2022-03-22: 4 mg via INTRAVENOUS
  Filled 2022-03-22: qty 1

## 2022-03-22 MED ORDER — SODIUM CHLORIDE 0.9 % IV BOLUS
500.0000 mL | Freq: Once | INTRAVENOUS | Status: AC
  Administered 2022-03-22: 500 mL via INTRAVENOUS

## 2022-03-22 MED ORDER — INSULIN ASPART 100 UNIT/ML IJ SOLN
0.0000 [IU] | Freq: Three times a day (TID) | INTRAMUSCULAR | Status: DC
  Filled 2022-03-22: qty 0.15

## 2022-03-22 MED ORDER — SODIUM CHLORIDE 0.9 % IV SOLN: INTRAVENOUS | Status: DC

## 2022-03-22 MED ORDER — INSULIN ASPART 100 UNIT/ML IJ SOLN
0.0000 [IU] | Freq: Every day | INTRAMUSCULAR | Status: DC
  Filled 2022-03-22: qty 0.05

## 2022-03-22 MED ORDER — ONDANSETRON HCL 4 MG/2ML IJ SOLN
4.0000 mg | Freq: Four times a day (QID) | INTRAMUSCULAR | Status: DC | PRN
  Administered 2022-03-22 (×3): 4 mg via INTRAVENOUS
  Filled 2022-03-22 (×3): qty 2

## 2022-03-22 MED ORDER — HYDROMORPHONE HCL 2 MG/ML IJ SOLN
2.0000 mg | INTRAMUSCULAR | Status: DC | PRN
  Administered 2022-03-22 – 2022-03-23 (×6): 2 mg via INTRAVENOUS
  Filled 2022-03-22 (×7): qty 1

## 2022-03-22 MED ORDER — PROCHLORPERAZINE EDISYLATE 10 MG/2ML IJ SOLN
10.0000 mg | Freq: Once | INTRAMUSCULAR | Status: AC
  Administered 2022-03-22: 10 mg via INTRAVENOUS
  Filled 2022-03-22: qty 2

## 2022-03-22 MED ORDER — METOPROLOL TARTRATE 5 MG/5ML IV SOLN
5.0000 mg | Freq: Once | INTRAVENOUS | Status: AC
  Administered 2022-03-22: 5 mg via INTRAVENOUS
  Filled 2022-03-22: qty 5

## 2022-03-22 MED ORDER — FLUOXETINE HCL 20 MG PO CAPS: 40.0000 mg | ORAL_CAPSULE | Freq: Every day | ORAL | Status: DC

## 2022-03-22 MED ORDER — SODIUM CHLORIDE 0.9 % NICU IV INFUSION SIMPLE: 500.0000 mL | INJECTION | Freq: Once | INTRAVENOUS | Status: DC

## 2022-03-22 MED ORDER — ENOXAPARIN SODIUM 40 MG/0.4ML IJ SOSY
40.0000 mg | PREFILLED_SYRINGE | INTRAMUSCULAR | Status: DC
  Administered 2022-03-22 – 2022-03-25 (×4): 40 mg via SUBCUTANEOUS
  Filled 2022-03-22 (×4): qty 0.4

## 2022-03-22 MED ORDER — METOCLOPRAMIDE HCL 5 MG/ML IJ SOLN
10.0000 mg | Freq: Once | INTRAMUSCULAR | Status: AC
  Administered 2022-03-22: 10 mg via INTRAVENOUS
  Filled 2022-03-22: qty 2

## 2022-03-22 MED ORDER — TIZANIDINE HCL 4 MG PO TABS
4.0000 mg | ORAL_TABLET | Freq: Three times a day (TID) | ORAL | Status: DC
  Administered 2022-03-22 – 2022-03-25 (×8): 4 mg
  Filled 2022-03-22 (×8): qty 1

## 2022-03-22 MED ORDER — ARIPIPRAZOLE 5 MG PO TABS: 5.0000 mg | ORAL_TABLET | Freq: Every day | ORAL | Status: DC

## 2022-03-22 MED ORDER — HYDROMORPHONE HCL 1 MG/ML IJ SOLN
1.0000 mg | INTRAMUSCULAR | Status: DC | PRN
  Administered 2022-03-22: 1 mg via INTRAVENOUS
  Filled 2022-03-22: qty 1

## 2022-03-22 MED ORDER — INSULIN ASPART 100 UNIT/ML IJ SOLN
0.0000 [IU] | INTRAMUSCULAR | Status: DC
  Administered 2022-03-22: 2 [IU] via SUBCUTANEOUS
  Administered 2022-03-23: 3 [IU] via SUBCUTANEOUS
  Administered 2022-03-24: 2 [IU] via SUBCUTANEOUS
  Filled 2022-03-22: qty 0.15

## 2022-03-22 MED ORDER — HYDROMORPHONE HCL 2 MG/ML IJ SOLN: 2.0000 mg | INTRAMUSCULAR | Status: DC | PRN

## 2022-03-22 MED ORDER — LACTATED RINGERS IV BOLUS
500.0000 mL | Freq: Once | INTRAVENOUS | Status: AC
  Administered 2022-03-22: 500 mL via INTRAVENOUS

## 2022-03-22 NOTE — Progress Notes (Signed)
Patient requiring IV 2nd line - Ultrasound to entire RT and Lt arms lower Forearms and upper arms posterior and anterior surfaces. Nothing visualized suitable for IV cannulation. Veins too small or too deep. RN made aware - secure chat to Dibia MD to make aware of IV access situation.

## 2022-03-22 NOTE — ED Notes (Signed)
Patient continues to have pain and is restless.  States she does not feel good.  Abdomen is soft and round.  No bowel sounds heard on assessment.  2nd RN confirmed same.  MD notified and to bedside.

## 2022-03-22 NOTE — H&P (Signed)
PCP:   Ferd Hibbs, NP   Chief Complaint: Nausea, vomiting, and abdominal pain  HPI: This is a 51 year old female with past medical history diabetes mellitus type 2, hypothyroidism, bipolar disease, PTSD, cyclic vomiting, hypertension, cardiac arrest [with MI in 3/22] which led to anoxic brain injury. Per patient since 8 AM yesterday she has had nausea and vomiting, intractable.  Is been getting progressively worse.  She reports getting lightheaded and dizzy, decreased urine output, and lots of dry heaving.  She has not been able to hold on their meds since yesterday.  She came to the ER.  In the ER CT abdomen positive for colitis in transverse as well as descending colon.  Review of Systems:  The patient denies anorexia, fever, weight loss,, vision loss, decreased hearing, hoarseness, chest pain, syncope, dyspnea on exertion, peripheral edema, balance deficits, hemoptysis, abdominal pain, melena, hematochezia, severe indigestion/heartburn, hematuria, incontinence, genital sores, muscle weakness, suspicious skin lesions, transient blindness, difficulty walking, depression, unusual weight change, abnormal bleeding, enlarged lymph nodes, angioedema, and breast masses. Positives: Nausea, vomiting, lightheadedness, dizziness, oliguria, abdominal discomfort  Past Medical History: Past Medical History:  Diagnosis Date   Anxiety    Arthritis    "joints ache all over" (10/15/2014)   Barrett's esophagus    Bulging lumbar disc    Chronic lower back pain    DDD (degenerative disc disease), cervical    Depression    Drug-seeking behavior    Headache    "weekly" (10/15/2014)   Hyperlipemia    Hypertension    PTSD (post-traumatic stress disorder)    Skin cancer    "had them cut off my arms; don't know what kind"   Past Surgical History:  Procedure Laterality Date   ABLATION ON ENDOMETRIOSIS  2008   BIOPSY  12/27/2018   Procedure: BIOPSY;  Surgeon: Thornton Park, MD;  Location: [facility]  ENDOSCOPY;  Service: Gastroenterology;;   BIOPSY  10/05/2021   Procedure: BIOPSY;  Surgeon: Gatha Mayer, MD;  Location: [facility] ENDOSCOPY;  Service: Gastroenterology;;   ESOPHAGOGASTRODUODENOSCOPY (EGD) WITH PROPOFOL N/A 12/27/2018   Procedure: ESOPHAGOGASTRODUODENOSCOPY (EGD) WITH PROPOFOL;  Surgeon: Thornton Park, MD;  Location: [facility] ENDOSCOPY;  Service: Gastroenterology;  Laterality: N/A;   ESOPHAGOGASTRODUODENOSCOPY (EGD) WITH PROPOFOL N/A 10/05/2021   Procedure: ESOPHAGOGASTRODUODENOSCOPY (EGD) WITH PROPOFOL;  Surgeon: Gatha Mayer, MD;  Location: [facility];  Service: Gastroenterology;  Laterality: N/A;   HEMORRHOID SURGERY  ~ 2002   IR FLUORO GUIDE CV LINE RIGHT  06/14/2020   IR REMOVAL TUN CV CATH W/O FL  06/23/2020   IR US GUIDE VASC ACCESS RIGHT  06/14/2020   ORIF ANKLE FRACTURE Right 03/28/2020   Procedure: OPEN REDUCTION INTERNAL FIXATION (ORIF) RIGHT BIMALLEOLAR ANKLE FRACTURE;  Surgeon: Marchia Bond, MD;  Location: [place];  Service: Orthopedics;  Laterality: Right;    Medications: Prior to Admission medications   Medication Sig Start Date End Date Taking? Authorizing Provider  acetaminophen (TYLENOL) 325 MG tablet Take 2 tablets (650 mg total) by mouth every 6 (six) hours as needed for mild pain, fever or headache. Patient taking differently: Take 650 mg by mouth every 6 (six) hours as needed for mild pain. 07/01/20   Angiulli, Lavon Paganini, PA-C  amLODipine (NORVASC) 10 MG tablet Take 1 tablet (10 mg total) by mouth daily. 10/07/21   Shelly Coss, MD  ARIPiprazole (ABILIFY) 5 MG tablet Take 1 tablet (5 mg total) by mouth daily. 08/01/20   Jamse Arn, MD  atorvastatin (LIPITOR) 10 MG tablet Take  10 mg by mouth daily. 08/26/20   [provider]  carvedilol (COREG) 12.5 MG tablet Take 1 tablet (12.5 mg total) by mouth 2 (two) times daily with a meal. 10/06/21   Shelly Coss, MD  FLUoxetine (PROZAC) 40 MG capsule Take 1 capsule (40 mg total) by mouth  daily. 07/01/20   Angiulli, Lavon Paganini, PA-C  HYDROcodone-acetaminophen (NORCO) 10-325 MG tablet Take 1 tablet by mouth every 6 (six) hours as needed for severe pain. 02/09/22   [provider]  levothyroxine (SYNTHROID) 50 MCG tablet Take 50 mcg by mouth daily.    [provider]  lidocaine (XYLOCAINE) 5 % ointment Apply 1 Application topically daily as needed for mild pain. 02/26/22   [provider]  lisinopril (ZESTRIL) 10 MG tablet Take 1 tablet (10 mg total) by mouth daily. 10/06/21 10/06/22  Shelly Coss, MD  metFORMIN (GLUCOPHAGE) 500 MG tablet Take 500 mg by mouth 2 (two) times daily with a meal.    [provider]  metoCLOPramide (REGLAN) 5 MG tablet Take 5 mg by mouth 2 (two) times daily. 01/01/22   [provider]  multivitamin (RENA-VIT) TABS tablet Take 1 tablet by mouth at bedtime. 08/01/20   Jamse Arn, MD  ondansetron (ZOFRAN-ODT) 4 MG disintegrating tablet Take 1 tablet (4 mg total) by mouth every 6 (six) hours as needed for nausea or vomiting. 11/08/21   Willia Craze, NP  pantoprazole (PROTONIX) 40 MG tablet Take 1 tablet (40 mg total) by mouth daily. 09/21/21 09/21/22  Elodia Florence., MD  prazosin (MINIPRESS) 2 MG capsule Take 1 capsule (2 mg total) by mouth at bedtime. 08/01/20   Jamse Arn, MD  promethazine (PHENERGAN) 25 MG suppository Place 1 suppository (25 mg total) rectally every 6 (six) hours as needed for nausea or vomiting. 12/27/21   Drenda Freeze, MD  tiZANidine (ZANAFLEX) 4 MG tablet Take 3 tablets (12 mg total) by mouth every 8 (eight) hours as needed for muscle spasms. 12/27/21   Drenda Freeze, MD  traZODone (DESYREL) 50 MG tablet Take 3 tablets (150 mg total) by mouth at bedtime. 09/21/21   Elodia Florence., MD    Allergies:  No Known Allergies  Social History:  reports that she has never smoked. She has never used smokeless tobacco. She reports that she does not currently use alcohol. She  reports that she does not use drugs.  Family History: Family History  Problem Relation Age of Onset   Breast cancer Mother    Diabetes Mother    Breast cancer Maternal Grandmother    Breast cancer Paternal Grandmother    Colon polyps Paternal Grandmother    Colon cancer Neg Hx    Esophageal cancer Neg Hx    Rectal cancer Neg Hx    Stomach cancer Neg Hx     Physical Exam: Vitals:   03/21/22 2200 03/21/22 2245 03/22/22 0030 03/22/22 0052  BP: (!) 168/142 (!) 147/57 121/89   Pulse: (!) 112 (!) 120 (!) 123   Resp: (!) '8 17 17   '$ Temp:    99 F (37.2 C)  TempSrc:    Oral  SpO2: 99% 100% 99%   Weight:      Height:        General:  Alert nd oriented times three, well developed and nourished.  Ill appearing female Eyes: PERRLA, pink conjunctiva, no scleral icterus ENT: dry oral mucosa, neck supple, no thyromegaly Lungs: clear to ascultation, no wheeze, no  crackles, no use of accessory muscles Cardiovascular: Tachycardia, regular rate and rhythm, no regurgitation, no gallops, no murmurs. No carotid bruits, no JVD Abdomen: soft, positive BS, non-tender, non-distended, no organomegaly, generalized tenderness to palpation, distended GU: not examined Neuro: CN II - XII grossly intact, sensation intact Musculoskeletal: strength 5/5 all extremities, no clubbing, cyanosis or edema Skin: no rash, no subcutaneous crepitation, no decubitus Psych: appropriate patient   Labs on Admission:  Recent Labs    03/21/22 2037  NA 141  K 3.6  CL 102  CO2 20*  GLUCOSE 152*  BUN 13  CREATININE 1.26*  CALCIUM 9.4   Recent Labs    03/21/22 2037  AST 49*  ALT 28  ALKPHOS 110  BILITOT 0.7  PROT 8.2*  ALBUMIN 3.4*   Recent Labs    03/21/22 2037  LIPASE 24   Recent Labs    03/21/22 1911  WBC 19.8*  NEUTROABS 17.4*  HGB 16.1*  HCT 48.8*  MCV 93.3  PLT 444*    Radiological Exams on Admission: CT ABDOMEN PELVIS W CONTRAST  Result Date: 03/22/2022 CLINICAL DATA:  Acute  abdominal pain. Vomiting and diarrhea onset this morning. Dysuria also. EXAM: CT ABDOMEN AND PELVIS WITH CONTRAST TECHNIQUE: Multidetector CT imaging of the abdomen and pelvis was performed using the standard protocol following bolus administration of intravenous contrast. RADIATION DOSE REDUCTION: This exam was performed according to the departmental dose-optimization program which includes automated exposure control, adjustment of the mA and/or kV according to patient size and/or use of iterative reconstruction technique. CONTRAST:  126m OMNIPAQUE IOHEXOL 300 MG/ML  SOLN COMPARISON:  Multiple prior CTs dating back to 2016. The 2 most recent are CTs with contrast 03/03/2022 and 02/03/2022. FINDINGS: Lower chest: No acute abnormality. Chronic elevation right hemidiaphragm. There is a tiny hiatal hernia. Partially healed right seventh through ninth rib fractures are again shown. Hepatobiliary: There is mild-to-moderate hepatic steatosis with liver length of 18.5 cm. There is no mass enhancement. Unremarkable gallbladder and bile ducts. Pancreas: Partially atrophic, otherwise unremarkable. Spleen: Homogeneous enhancement.  No splenomegaly. Adrenals/Urinary Tract: There is no adrenal mass. There is homogeneous renal cortical enhancement. There is no urinary stone or obstruction. The bladder is contracted around a Foley balloon and not well seen. There is symmetric excretion in the delayed imaging. Stomach/Bowel: The stomach, small bowel and appendix are unremarkable. There is disproportionate thickening and faint stranding involving the ascending colon and proximal transverse segment. Rest of the colon wall is unremarkable. Findings consistent with ascending and proximal transverse colitis. Etiology is likely infectious or inflammatory. There is normal opacification of the SMA, IMA, and SMV and branch vessels and no wall pneumatosis. Vascular/Lymphatic: Aortic atherosclerosis. No enlarged abdominal or pelvic lymph  nodes. Reproductive: Uterus and bilateral adnexa are unremarkable. Other: There are small umbilical and inguinal fat hernias. There is no incarcerated hernia, no free air, free hemorrhage or free fluid. Musculoskeletal: No acute or significant osseous findings. Mild chronic wedging T11 vertebral body. Healing right lower rib cage fractures as above. Central endplate compression deformity again noted at L3. IMPRESSION: 1. Evidence of ascending and proximal transverse colitis, most likely infectious or inflammatory. Ischemic colitis is not not strictly excluded but there is normal opacification of the SMA, IMA, and SMV and branch vessels, therefore it is considered less likely. 2. Aortic atherosclerosis. 3. Fatty liver. 4. Small hiatal hernia. 5. Umbilical and inguinal fat hernias. 6. Healing right lower rib cage fractures. Aortic Atherosclerosis (ICD10-I70.0). Electronically Signed   By: KTelford Nab  M.D.   On: 03/22/2022 00:21    Assessment/Plan Present on Admission:  Colitis/ Nausea & vomiting -Admit to MedSurg -IV Zosyn, pharmacy to dose -IV fluid hydration -C. difficile colitis ordered -Pain meds as needed -Antiemetics as needed -IV Protonix held with patient's diarrhea   ARF (acute renal failure) (HCC) -IV fluid hydration, -BMP in a.m., I's and O's  Lactic acidosis -Initial lactic acid 4.7, repeat 6.5.  Bolusing and repeating lactic acid level. -Follow lactic acid carotid normalized  Polysubstance abuse/ -UDS positive amphetamines and opiates  Gastroparesis -Reglan IV   Bipolar affective disorder (HCC)/ PTSD (post-traumatic stress disorder) -Resume Abilify and trazodone   Hypothyroidism -Stable Synthroid 50 mcg p.o. daily resumed   Chronic lower back pain -   Essential hypertension -Stable, Norvasc, Coreg, Zestril, Minipress resumed   Hyperlipidemia -Stable, Lipitor resumed  Diabetes mellitus -Sliding scale insulin   Anoxic brain damage ([place])   Kristen Bushway,  Kaylina Cahue 03/22/2022, 1:31 AM

## 2022-03-22 NOTE — ED Notes (Signed)
Patient continues to be restless.  She is requesting more medication for pain.

## 2022-03-22 NOTE — ED Notes (Signed)
Patient continues to report she is not feeling well.  She continues to be tachycardic at rest.

## 2022-03-22 NOTE — ED Notes (Signed)
Patient educated again on purpose of the NG tube.  9 F placed to the left nare.  She tolerated well.  Note of clear liquid return immediately with small amount of bright red blood.  NG placement x 2 due to patient removing the first one. Xray ordered to confirm placement.

## 2022-03-22 NOTE — Progress Notes (Signed)
   03/22/22 1950  Assess: MEWS Score  Temp (!) 97.4 F (36.3 C)  BP (!) 160/101  MAP (mmHg) 120  Pulse Rate (!) 114  Resp (!) 22  SpO2 96 %  Assess: MEWS Score  MEWS Temp 0  MEWS Systolic 0  MEWS Pulse 2  MEWS RR 1  MEWS LOC 0  MEWS Score 3  MEWS Score Color Yellow  Assess: if the MEWS score is Yellow or Red  Were vital signs taken at a resting state? Yes  Focused Assessment Change from prior assessment (see assessment flowsheet)  Does the patient meet 2 or more of the SIRS criteria? Yes  Does the patient have a confirmed or suspected source of infection? Yes  Provider and Rapid Response Notified? Yes  MEWS guidelines implemented *See Row Information* Yes  Treat  MEWS Interventions Administered prn meds/treatments  Take Vital Signs  Increase Vital Sign Frequency  Yellow: Q 2hr X 2 then Q 4hr X 2, if remains yellow, continue Q 4hrs  Escalate  MEWS: Escalate Yellow: discuss with charge nurse/RN and consider discussing with provider and RRT  Notify: Charge Nurse/RN  Name of Charge Nurse/RN Notified T Kursten Kruk  Date Charge Nurse/RN Notified 03/22/22  Time Charge Nurse/RN Notified 1955  Provider Notification  Provider Name/Title [place]  Date Provider Notified 03/22/22  Time Provider Notified 1956  Method of Notification Page  Notification Reason Change in status  Provider response No new orders  Date of Provider Response 03/22/22  Time of Provider Response  (no response)  Document  Patient Outcome Stabilized after interventions  Progress note created (see row info) Yes  Assess: SIRS CRITERIA  SIRS Temperature  0  SIRS Pulse 1  SIRS Respirations  1  SIRS WBC 1  SIRS Score Sum  3

## 2022-03-22 NOTE — ED Notes (Signed)
Patient returned from CT still complaining of nausea and pain   MD aware.  Orders noted and medications administered per orders.  Patient has only small amount of amber urine noted in her collection bag.

## 2022-03-22 NOTE — ED Notes (Signed)
Imaging reported to RN that NG tube was not visible nurse returned to room and NG tube was on pt. Chest. Patient denied removing tube.

## 2022-03-22 NOTE — ED Notes (Signed)
16 Fr. NG tube placed in right nare. Education emphasized about keeping NG tube in place, patient states understanding.

## 2022-03-22 NOTE — Progress Notes (Addendum)
Patient reevaluated at bedside.  Decreased bowel sounds.  KUB ordered.  NG tube ordered as patient with persistent nausea, vomiting and pain unrelieved with antiemetics.  Patient's lactic acid decreasing 6.5=> 4.1=> 3.3 WBC with slight increase on 19.8=> 20.5.  No bandemia Patient remains very tachycardic HR 130s to 145 despite total of 3 L IVF bolus Patient is on IV Zosyn. Dilaudid increased from 1 mg to 2 mg IV every 4 hours as needed

## 2022-03-22 NOTE — ED Notes (Signed)
Patient continues to have pain and nausea.  Zofran to be given per prn orders

## 2022-03-22 NOTE — ED Notes (Signed)
35 F NG placed into the left nare.  Patient educated on the purpose of the tube.  The tube was measured and secured to the bridge of the nose with the NG securment device.  This RN walked out of the room and patient pulled the tube out.  XRAY came to the bedside to completed the post placement xray and that is when we discovered she had pulled the tube.  Her response when asked if she pulled the tube out was "yes, I am sorry"

## 2022-03-22 NOTE — Progress Notes (Signed)
Consultation Progress Note   Patient: [name] ERD:[id number] DOB: 17-Oct-1970 DOA: 03/21/2022 DOS: the patient was seen and examined on 03/22/2022 Primary service: Aven Cegielski, Manfred Shirts, MD  Brief hospital course: 51 year old female with past medical history diabetes mellitus type 2, hypothyroidism, bipolar disease, PTSD, cyclic vomiting, hypertension, cardiac arrest [with MI in 3/22] which led to anoxic brain injury. Per patient since 8 AM yesterday she has had nausea and vomiting, intractable.  Is been getting progressively worse.  She reports getting lightheaded and dizzy, decreased urine output, and lots of dry heaving.  She has not been able to hold on their meds.  In the ER CT abdomen positive for colitis in transverse as well as descending colon.  NG tube placed due to intractable vomiting    Assessment and Plan:  Colitis/ Nausea & vomiting Keep NPO, NG tube in place -IV Zosyn, pharmacy to dose -IV fluid hydration -C. difficile colitis ordered -Pain meds as needed -Antiemetics as needed  Hpokalemia -Low K, replete via IV  Tachycardia -In setting of Intractable vomiting, continue IVF. -Has hx of illicit drug use, previous UDS positive for amphetamines, opiates.. Consider metoprolol iv Prn    Lactic acidosis -Resolving   Polysubstance abuse/ -UDS positive amphetamines and opiates   Gastroparesis -Reglan IV    Bipolar affective disorder (HCC)/ PTSD (post-traumatic stress disorder) -Resume Abilify and trazodone    Hypothyroidism -Stable Synthroid 50 mcg p.o. daily resumed    Chronic lower back pain -    Essential hypertension -Stable, Norvasc, Coreg, Zestril, Minipress resumed    Hyperlipidemia -Stable, Lipitor resumed   Diabetes mellitus -Sliding scale insulin    Anoxic brain damage (HCC)       TRH will continue to follow the patient.  Subjective: Continues to have periumblical pain, still having emesis. Pulled out 2 NG tubes. Last BM was  11/29.  Physical Exam: Vitals:   03/22/22 0530 03/22/22 0734 03/22/22 0740 03/22/22 0830  BP: 124/88  (!) 146/112 (!) 132/100  Pulse: (!) 145   (!) 130  Resp: '15  15 14  '$ Temp:  98.5 F (36.9 C)    TempSrc:  Oral    SpO2: 93%  97% 96%  Weight:      Height:      General:  Alert nd oriented times three, well developed and nourished.  Ill appearing female Eyes: PERRLA, pink conjunctiva, no scleral icterus ENT: dry oral mucosa, neck supple, no thyromegaly Lungs: clear to ascultation, no wheeze, no crackles, no use of accessory muscles Cardiovascular: Tachycardia, regular rate and rhythm, no regurgitation, no gallops, no murmurs. No carotid bruits, no JVD Abdomen: soft, positive BS, non-tender, non-distended, no organomegaly, generalized tenderness to palpation, distended GU: not examined Neuro: CN II - XII grossly intact, sensation intact Musculoskeletal: strength 5/5 all extremities, no clubbing, cyanosis or edema Skin: no rash, no subcutaneous crepitation, no decubitus  Data Reviewed:  There are no new results to review at this time.  Family Communication: None at bedside  Time spent: 15 minutes.  Author: Cristela Felt, MD 03/22/2022 12:17 PM  For on call review [email or web address].

## 2022-03-23 ENCOUNTER — Inpatient Hospital Stay (HOSPITAL_COMMUNITY): Payer: Medicare Other

## 2022-03-23 LAB — BASIC METABOLIC PANEL
Anion gap: 10 (ref 5–15)
BUN: <5 mg/dL — ABNORMAL LOW (ref 6–20)
CO2: 25 mmol/L (ref 22–32)
Calcium: 8.6 mg/dL — ABNORMAL LOW (ref 8.9–10.3)
Chloride: 109 mmol/L (ref 98–111)
Creatinine, Ser: 0.64 mg/dL (ref 0.44–1.00)
GFR, Estimated: >60 mL/min (ref >60)
Glucose, Bld: 144 mg/dL — ABNORMAL HIGH (ref 70–99)
Potassium: 3.9 mmol/L (ref 3.5–5.1)
Sodium: 144 mmol/L (ref 135–145)

## 2022-03-23 LAB — CBC WITH DIFFERENTIAL/PLATELET
Abs Immature Granulocytes: 0.28 10*3/uL — ABNORMAL HIGH (ref 0.00–0.07)
Basophils Absolute: 0.1 10*3/uL (ref 0.0–0.1)
Basophils Relative: 1 %
Eosinophils Absolute: 0 10*3/uL (ref 0.0–0.5)
Eosinophils Relative: 0 %
HCT: 41 % (ref 36.0–46.0)
Hemoglobin: 12.6 g/dL (ref 12.0–15.0)
Immature Granulocytes: 2 %
Lymphocytes Relative: 8 %
Lymphs Abs: 1.3 10*3/uL (ref 0.7–4.0)
MCH: 30.7 pg (ref 26.0–34.0)
MCHC: 30.7 g/dL (ref 30.0–36.0)
MCV: 99.8 fL (ref 80.0–100.0)
Monocytes Absolute: 1.5 10*3/uL — ABNORMAL HIGH (ref 0.1–1.0)
Monocytes Relative: 9 %
Neutro Abs: 13.7 10*3/uL — ABNORMAL HIGH (ref 1.7–7.7)
Neutrophils Relative %: 80 %
Platelets: 295 10*3/uL (ref 150–400)
RBC: 4.11 MIL/uL (ref 3.87–5.11)
RDW: 14.6 % (ref 11.5–15.5)
WBC: 16.9 10*3/uL — ABNORMAL HIGH (ref 4.0–10.5)
nRBC: 0 % (ref 0.0–0.2)

## 2022-03-23 LAB — MAGNESIUM: Magnesium: 1.6 mg/dL — ABNORMAL LOW (ref 1.7–2.4)

## 2022-03-23 LAB — GLUCOSE, CAPILLARY
Glucose-Capillary: 100 mg/dL — ABNORMAL HIGH (ref 70–99)
Glucose-Capillary: 101 mg/dL — ABNORMAL HIGH (ref 70–99)
Glucose-Capillary: 115 mg/dL — ABNORMAL HIGH (ref 70–99)
Glucose-Capillary: 163 mg/dL — ABNORMAL HIGH (ref 70–99)
Glucose-Capillary: 91 mg/dL (ref 70–99)
Glucose-Capillary: 94 mg/dL (ref 70–99)

## 2022-03-23 MED ORDER — ARIPIPRAZOLE 5 MG PO TABS
5.0000 mg | ORAL_TABLET | Freq: Every day | ORAL | Status: DC
  Administered 2022-03-23 – 2022-03-25 (×3): 5 mg via ORAL
  Filled 2022-03-23 (×3): qty 1

## 2022-03-23 MED ORDER — CHLORHEXIDINE GLUCONATE CLOTH 2 % EX PADS
6.0000 | MEDICATED_PAD | Freq: Every day | CUTANEOUS | Status: DC
  Administered 2022-03-23 – 2022-03-24 (×2): 6 via TOPICAL

## 2022-03-23 MED ORDER — PRAZOSIN HCL 1 MG PO CAPS
2.0000 mg | ORAL_CAPSULE | Freq: Every day | ORAL | Status: DC
  Administered 2022-03-23 – 2022-03-24 (×2): 2 mg via ORAL
  Filled 2022-03-23 (×2): qty 2

## 2022-03-23 MED ORDER — AMLODIPINE BESYLATE 10 MG PO TABS
10.0000 mg | ORAL_TABLET | Freq: Every day | ORAL | Status: DC
  Administered 2022-03-23 – 2022-03-25 (×3): 10 mg via ORAL
  Filled 2022-03-23 (×3): qty 1

## 2022-03-23 MED ORDER — METOPROLOL TARTRATE 5 MG/5ML IV SOLN: 2.5000 mg | Freq: Four times a day (QID) | INTRAVENOUS | Status: DC | PRN

## 2022-03-23 MED ORDER — MAGNESIUM SULFATE 2 GM/50ML IV SOLN
2.0000 g | Freq: Once | INTRAVENOUS | Status: AC
  Administered 2022-03-23: 2 g via INTRAVENOUS
  Filled 2022-03-23: qty 50

## 2022-03-23 MED ORDER — LEVOTHYROXINE SODIUM 50 MCG PO TABS
50.0000 ug | ORAL_TABLET | Freq: Every day | ORAL | Status: DC
  Administered 2022-03-23 – 2022-03-25 (×3): 50 ug
  Filled 2022-03-23 (×2): qty 1

## 2022-03-23 MED ORDER — TRAZODONE HCL 50 MG PO TABS
150.0000 mg | ORAL_TABLET | Freq: Every day | ORAL | Status: DC
  Administered 2022-03-23 – 2022-03-24 (×2): 150 mg via ORAL
  Filled 2022-03-23 (×3): qty 1

## 2022-03-23 MED ORDER — CARVEDILOL 12.5 MG PO TABS
12.5000 mg | ORAL_TABLET | Freq: Two times a day (BID) | ORAL | Status: DC
  Administered 2022-03-23 – 2022-03-25 (×4): 12.5 mg via ORAL
  Filled 2022-03-23 (×4): qty 1

## 2022-03-23 MED ORDER — PANTOPRAZOLE SODIUM 40 MG IV SOLR
40.0000 mg | INTRAVENOUS | Status: DC
  Administered 2022-03-23 – 2022-03-24 (×2): 40 mg via INTRAVENOUS
  Filled 2022-03-23 (×2): qty 10

## 2022-03-23 MED ORDER — HYDROMORPHONE HCL 1 MG/ML IJ SOLN
0.5000 mg | INTRAMUSCULAR | Status: DC | PRN
  Administered 2022-03-23 – 2022-03-25 (×7): 1 mg via INTRAVENOUS
  Filled 2022-03-23 (×7): qty 1

## 2022-03-23 MED ORDER — METOCLOPRAMIDE HCL 5 MG/ML IJ SOLN
10.0000 mg | Freq: Four times a day (QID) | INTRAMUSCULAR | Status: DC
  Administered 2022-03-23 – 2022-03-25 (×8): 10 mg via INTRAVENOUS
  Filled 2022-03-23 (×8): qty 2

## 2022-03-23 NOTE — Consult Note (Addendum)
Referring Provider: Dr. Aline August Primary Care Physician:  Ferd Hibbs, NP Primary Gastroenterologist:  Dr. Thornton Park   Reason for Consultation: Intractable nausea and vomiting along with colitis  HPI: [name] is a 51 y.o. female with a past medical history of anxiety, depression, anorexia as a teenager, PTSD, chronic pain syndrome on chronic narcotics per pain management, drug-seeking behavior, multiple urine drug screens positive for amphetamines, hypertension, cardiac arrest thought to be secondary to opiate overdose 05/2020, diabetes mellitus type 2, iron deficiency anemia (nondiagnostic EGD/colonoscopy and capsule endoscopy in 6378), cyclic vomiting syndrome, GERD, Barrett's esophagus per EGD 12/2018 and tubular adenomatous colon polyps.  She was admitted to the hospital 03/03/2022 due to having intractable nausea and vomiting with abdominal pain.  She also noted having nonbloody diarrhea.  She was tachycardic and received IV fluids and Coreg.  CTAP was negative for any acute intra-abdominal/pelvic pathology to explain her symptoms.  A healing right seventh through 10th rib fractures and hepatic steatosis were noted.  She received Reglan and supportive care and her clinical status stabilized therefore she was discharged home 03/04/2022.  Her nausea/vomiting and diarrhea abated then recurred Wed 11/29 around 8 or 9 AM. She developed nausea and vomited nonbloody green bilious emesis 6-7 episodes with associated central lower abdominal pain and passed 3 nonbloody brown watery diarrhea bowel movements.  She presented to [facility] ED 03/21/2022 for further evaluation.  Labs in the ED showed a WBC 19.8.  Hemoglobin 16.1.  Hematocrit 48.8.  Platelet 444.  Sodium 141.  Potassium 3.6.  BUN 13.  Creatinine 1.26 up from 0.72.  Total bili 0.7.  Alk phos 10.  AST 49.  ALT 28.  Venous lactic acid level 4.7.  Lipase 24.  Urine drug screen positive for amphetamines and opiates.   Troponin 43 -> 46.  C. difficile quick screen with PCR ordered, not yet completed as the patient has not had any further diarrhea since admission.  CTAP with contrast showed evidence of colitis to the ascending and proximal transverse colon likely infectious versus inflammatory versus ischemic colitis.  SMA, IMA and SMV vasculature was normal.  A small hiatal hernia, umbilical/inguinal fat hernias, hepatic steatosis and healing right lower rib fractures were noted as well.  Her nausea and vomiting are well-controlled at this time.  She has intermittent heartburn and occasional dysphagia. She continues to have mild central lower abdominal pain which is not severe.  No further diarrhea or BM since admission. She is passing gas per the rectum. She denies any recent antibiotic use prior to this hospital admission.  No NSAIDs.  She denies alcohol or drug use.  She takes Percocet 1 tab 3 times daily for chronic back pain as prescribed by her pain management specialist.  Her most recent EGD was 10/05/2021 which showed a small hiatal hernia and gastritis, no evidence of Barrett's esophagus.  She underwent a colonoscopy 03/04/2019 which identified 2 small tubular adenomatous polyps removed from the proximal ascending colon and at the IC valve.  Patchy inflammation was found in the rectum, biopsies were 9 without evidence of proctitis.   PAST GI PROCEDURES:  EGD 10/05/2021: Z-line irregular, at the gastroesophageal junction. Some exudate - ? adherent material This does not meet criteria for Barrett's esophagus (> 1 cm columnar change) Biopsied This does not meet criteria for Barrett's esophagus (> 1 cm columnar change) Biopsied given past intestinal metaplasia. - Small hiatal hernia. - Gastritis. - The examination was otherwise normal. A.  ESOPHAGUS,  Z-LINE, BIOPSY:  Oxyntocardia type gastric mucosa showing mild chronic gastritis and  reactive epithelial changes  Negative for squamous epithelium  Negative  for intestinal metaplasia, dysplasia and carcinoma   Capsule endoscopy 03/22/2019: Negative except one tiny possible AVM  Colonoscopy 03/04/2019: - One 1 mm polyp at the ileocecal valve, removed with a cold biopsy forceps. Resected and retrieved. - One 3 mm polyp in the proximal ascending colon, removed with a cold snare. Resected and retrieved. retrieved. - Patchy mild inflammation was found in the rectum. This is of unclear clinical significance and may be related to prep artifact. Biopsied. - The examination was otherwise normal on direct and retroflexion views. - 7 year colonoscopy recall 1. Surgical [P], ascending and ileocecal valve, polyp (2) - TUBULAR ADENOMA (2 OF 2 FRAGMENTS) - NO HIGH GRADE DYSPLASIA OR MALIGNANCY IDENTIFIED 2. Surgical [P], colon, rectum - BENIGN COLONIC MUCOSA - NO ACTIVE INFLAMMATION OR EVIDENCE OF MICROSCOPIC COLITIS - NO HIGH GRADE DYSPLASIA OR MALIGNANCY IDENTIFIED  EGD 12/27/2018: - Normal esophagus. Biopsied. - Small hiatal hernia. Otherwise, normal stomach. Biopsied. - Normal examined duodenum. Biopsied. 1. Duodenum, Biopsy - DUODENAL MUCOSA WITH NO SIGNIFICANT PATHOLOGIC FINDINGS. - NEGATIVE FOR INCREASED INTRAEPITHELIAL LYMPHOCYTES AND VILLOUS ARCHITECTURAL CHANGES. 2. Stomach, biopsy - GASTRIC ANTRAL MUCOSA WITH MILD REACTIVE GASTROPATHY. - GASTRIC OXYNTIC MUCOSA WITH MILD CHRONIC GASTRITIS. - WARTHIN-STARRY STAIN IS NEGATIVE FOR HELICOBACTER PYLORI. 3. Esophagus, biopsy, distal - INTESTINAL METAPLASIA (GOBLET CELL METAPLASIA) CONSISTENT WITH BARRETT'S ESOPHAGUS. - NEGATIVE FOR DYSPLASIA. 4. Esophagus, biopsy, mid and proximal - SQUAMOUS ESOPHAGEAL EPITHELIUM WITH NO SIGNIFICANT PATHOLOGIC FINDINGS. - NEGATIVE FOR INCREASED INTRAEPITHELIAL EOSINOPHILS.   Past Medical History:  Diagnosis Date   Anxiety    Arthritis    "joints ache all over" (10/15/2014)   Barrett's esophagus    Bulging lumbar disc    Chronic lower back pain    DDD  (degenerative disc disease), cervical    Depression    Drug-seeking behavior    Headache    "weekly" (10/15/2014)   Hyperlipemia    Hypertension    PTSD (post-traumatic stress disorder)    Skin cancer    "had them cut off my arms; don't know what kind"    Past Surgical History:  Procedure Laterality Date   ABLATION ON ENDOMETRIOSIS  2008   BIOPSY  12/27/2018   Procedure: BIOPSY;  Surgeon: Thornton Park, MD;  Location: [facility] ENDOSCOPY;  Service: Gastroenterology;;   BIOPSY  10/05/2021   Procedure: BIOPSY;  Surgeon: Gatha Mayer, MD;  Location: [facility];  Service: Gastroenterology;;   ESOPHAGOGASTRODUODENOSCOPY (EGD) WITH PROPOFOL N/A 12/27/2018   Procedure: ESOPHAGOGASTRODUODENOSCOPY (EGD) WITH PROPOFOL;  Surgeon: Thornton Park, MD;  Location: [facility] ENDOSCOPY;  Service: Gastroenterology;  Laterality: N/A;   ESOPHAGOGASTRODUODENOSCOPY (EGD) WITH PROPOFOL N/A 10/05/2021   Procedure: ESOPHAGOGASTRODUODENOSCOPY (EGD) WITH PROPOFOL;  Surgeon: Gatha Mayer, MD;  Location: [place];  Service: Gastroenterology;  Laterality: N/A;   HEMORRHOID SURGERY  ~ 2002   IR FLUORO GUIDE CV LINE RIGHT  06/14/2020   IR REMOVAL TUN CV CATH W/O FL  06/23/2020   IR US GUIDE VASC ACCESS RIGHT  06/14/2020   ORIF ANKLE FRACTURE Right 03/28/2020   Procedure: OPEN REDUCTION INTERNAL FIXATION (ORIF) RIGHT BIMALLEOLAR ANKLE FRACTURE;  Surgeon: Marchia Bond, MD;  Location: [facility];  Service: Orthopedics;  Laterality: Right;    Prior to Admission medications   Medication Sig Start Date End Date Taking? Authorizing Provider  acetaminophen (TYLENOL) 325 MG tablet Take 2 tablets (650 mg  total) by mouth every 6 (six) hours as needed for mild pain, fever or headache. Patient taking differently: Take 650 mg by mouth every 6 (six) hours as needed for mild pain. 07/01/20  Yes Angiulli, Lavon Paganini, PA-C  amLODipine (NORVASC) 10 MG tablet Take 1 tablet (10 mg total) by mouth daily. 10/07/21  Yes Shelly Coss, MD  ARIPiprazole (ABILIFY) 5 MG tablet Take 1 tablet (5 mg total) by mouth daily. 08/01/20  Yes Jamse Arn, MD  atorvastatin (LIPITOR) 10 MG tablet Take 10 mg by mouth daily. 08/26/20  Yes [provider]  carvedilol (COREG) 12.5 MG tablet Take 1 tablet (12.5 mg total) by mouth 2 (two) times daily with a meal. 10/06/21  Yes Adhikari, Amrit, MD  FLUoxetine (PROZAC) 40 MG capsule Take 1 capsule (40 mg total) by mouth daily. 07/01/20  Yes Angiulli, Lavon Paganini, PA-C  HYDROcodone-acetaminophen (NORCO) 10-325 MG tablet Take 1 tablet by mouth every 6 (six) hours as needed for severe pain. 02/09/22  Yes [provider]  levothyroxine (SYNTHROID) 50 MCG tablet Take 50 mcg by mouth daily.   Yes [provider]  lidocaine (XYLOCAINE) 5 % ointment Apply 1 Application topically daily as needed for mild pain. 02/26/22  Yes [provider]  lisinopril (ZESTRIL) 10 MG tablet Take 1 tablet (10 mg total) by mouth daily. 10/06/21 10/06/22 Yes Shelly Coss, MD  metFORMIN (GLUCOPHAGE) 500 MG tablet Take 500 mg by mouth 2 (two) times daily with a meal.   Yes [provider]  metoCLOPramide (REGLAN) 5 MG tablet Take 5 mg by mouth 2 (two) times daily. 01/01/22  Yes [provider]  multivitamin (RENA-VIT) TABS tablet Take 1 tablet by mouth at bedtime. 08/01/20  Yes Jamse Arn, MD  ondansetron (ZOFRAN-ODT) 8 MG disintegrating tablet Take 8 mg by mouth every 8 (eight) hours as needed for nausea or vomiting. 03/12/22  Yes [provider]  pantoprazole (PROTONIX) 40 MG tablet Take 1 tablet (40 mg total) by mouth daily. 09/21/21 09/21/22 Yes Elodia Florence., MD  prazosin (MINIPRESS) 2 MG capsule Take 1 capsule (2 mg total) by mouth at bedtime. 08/01/20  Yes Jamse Arn, MD  promethazine (PHENERGAN) 25 MG suppository Place 1 suppository (25 mg total) rectally every 6 (six) hours as needed for nausea or vomiting. 12/27/21  Yes Drenda Freeze, MD   tiZANidine (ZANAFLEX) 4 MG tablet Take 3 tablets (12 mg total) by mouth every 8 (eight) hours as needed for muscle spasms. 12/27/21  Yes Drenda Freeze, MD  traZODone (DESYREL) 50 MG tablet Take 3 tablets (150 mg total) by mouth at bedtime. 09/21/21  Yes Elodia Florence., MD    Current Facility-Administered Medications  Medication Dose Route Frequency Provider Last Rate Last Admin   0.9 %  sodium chloride infusion   Intravenous Continuous Aline August, MD 75 mL/hr at 03/23/22 1129 Rate Change at 03/23/22 1129   amLODipine (NORVASC) tablet 10 mg  10 mg Oral Daily Alekh, Kshitiz, MD       ARIPiprazole (ABILIFY) tablet 5 mg  5 mg Oral Daily Alekh, Kshitiz, MD       carvedilol (COREG) tablet 12.5 mg  12.5 mg Oral BID WC Alekh, Kshitiz, MD       Chlorhexidine Gluconate Cloth 2 % PADS 6 each  6 each Topical Daily Dibia, Manfred Shirts, MD   6 each at 03/23/22 0958   enoxaparin (LOVENOX) injection 40 mg  40 mg Subcutaneous Q24H Quintella Baton, MD  40 mg at 03/23/22 5885   HYDROmorphone (DILAUDID) injection 0.5-1 mg  0.5-1 mg Intravenous Q4H PRN Aline August, MD       insulin aspart (novoLOG) injection 0-15 Units  0-15 Units Subcutaneous Q4H Crosley, Debby, MD   3 Units at 03/23/22 0501   levothyroxine (SYNTHROID) tablet 50 mcg  50 mcg Per Tube Q0600 Raenette Rover, NP   50 mcg at 03/23/22 0544   magnesium sulfate IVPB 2 g 50 mL  2 g Intravenous Once Aline August, MD       metoCLOPramide (REGLAN) injection 10 mg  10 mg Intravenous Q6H Alekh, Kshitiz, MD       metoprolol tartrate (LOPRESSOR) injection 2.5 mg  2.5 mg Intravenous Q6H PRN Starla Link, Kshitiz, MD       ondansetron (ZOFRAN) injection 4 mg  4 mg Intravenous Q6H PRN Crosley, Debby, MD   4 mg at 03/22/22 1545   piperacillin-tazobactam (ZOSYN) IVPB 3.375 g  3.375 g Intravenous Q8H Crosley, Debby, MD 12.5 mL/hr at 03/23/22 0845 3.375 g at 03/23/22 0845   prazosin (MINIPRESS) capsule 2 mg  2 mg Oral QHS Alekh, Kshitiz, MD       tiZANidine  (ZANAFLEX) tablet 4 mg  4 mg Per Tube TID Dibia, Manfred Shirts, MD   4 mg at 03/23/22 0953   traZODone (DESYREL) tablet 150 mg  150 mg Oral QHS Aline August, MD        Allergies as of 03/21/2022   (No Known Allergies)    Family History  Problem Relation Age of Onset   Breast cancer Mother    Diabetes Mother    Breast cancer Maternal Grandmother    Breast cancer Paternal Grandmother    Colon polyps Paternal Grandmother    Colon cancer Neg Hx    Esophageal cancer Neg Hx    Rectal cancer Neg Hx    Stomach cancer Neg Hx     Social History   Socioeconomic History   Marital status: Married    Spouse name: Not on file   Number of children: Not on file   Years of education: Not on file   Highest education level: Not on file  Occupational History   Occupation: unemployed  Tobacco Use   Smoking status: Never   Smokeless tobacco: Never  Vaping Use   Vaping Use: Never used  Substance and Sexual Activity   Alcohol use: Not Currently    Comment: 10/15/2014 "I've drank before; nothing regular; don't drink now cause of RX I'm on"   Drug use: No   Sexual activity: Not Currently  Other Topics Concern   Not on file  Social History Narrative   [place]. Never smoked; quit alcohol 10 years; used to work for [employer]. NO IV drug abuse.    Social Determinants of Health   Financial Resource Strain: Not on file  Food Insecurity: No Food Insecurity (02/03/2022)   Hunger Vital Sign    Worried About Running Out of Food in the Last Year: Never true    Ran Out of Food in the Last Year: Never true  Transportation Needs: No Transportation Needs (02/03/2022)   PRAPARE - Hydrologist (Medical): No    Lack of Transportation (Non-Medical): No  Physical Activity: Not on file  Stress: Not on file  Social Connections: Not on file  Intimate Partner Violence: Not At Risk (02/03/2022)   Humiliation, Afraid, Rape, and Kick  questionnaire    Fear  of Current or Ex-Partner: No    Emotionally Abused: No    Physically Abused: No    Sexually Abused: No    Review of Systems: Gen: Denies fever, sweats or chills. No weight loss.  CV: Denies chest pain, palpitations or edema. Resp: Denies cough, shortness of breath of hemoptysis.  GI: See HPI.  GU : Denies urinary burning, blood in urine, increased urinary frequency or incontinence. MS: Denies joint pain, muscles aches or weakness. Derm: Denies rash, itchiness, skin lesions or unhealing ulcers. Psych: + Anxiety and depression.  Heme: Denies easy bruising, bleeding. Neuro:  Denies headaches, dizziness or paresthesias. Endo:  + DM II.  Physical Exam: Vital signs in last 24 hours: Temp:  [97.4 F (36.3 C)-98.3 F (36.8 C)] 97.5 F (36.4 C) (12/01 0458) Pulse Rate:  [100-119] 106 (12/01 0458) Resp:  [14-22] 20 (12/01 0458) BP: (118-182)/(78-124) 123/80 (12/01 0458) SpO2:  [82 %-99 %] 97 % (12/01 0458) Last BM Date : 03/21/22 (03/21/22) General: 51 year old female in no acute distress. Head:  Normocephalic and atraumatic. Eyes:  No scleral icterus. Conjunctiva pink. Ears:  Normal auditory acuity. Nose:  No deformity, discharge or lesions. Mouth:  Dentition intact. No ulcers or lesions.  Neck:  Supple. No lymphadenopathy or thyromegaly.  Lungs: Breath sounds clear throughout. Heart: Regular rate and rhythm, no murmurs. Abdomen: Soft, nondistended.  Mild tenderness throughout the central mid to lower abdomen and RUQ without rebound or guarding.  Positive bowel sounds to all 4 quadrants. Rectal: Deferred. Musculoskeletal:  Symmetrical without gross deformities.  Pulses:  Normal pulses noted. Extremities:  Without clubbing or edema. Neurologic:  Alert and  oriented x 4. No focal deficits.  Skin:  Intact without significant lesions or rashes. Psych:  Alert and cooperative. Normal mood and affect.  Intake/Output from previous day: 11/30 0701 - 12/01  0700 In: 1461.1 [I.V.:1162.5; IV Piggyback:298.6] Out: 450 [Urine:450] Intake/Output this shift: Total I/O In: -  Out: 900 [Urine:900]  Lab Results: Recent Labs    03/21/22 1911 03/22/22 0458 03/23/22 0534  WBC 19.8* 20.5* 16.9*  HGB 16.1* 13.6 12.6  HCT 48.8* 40.9 41.0  PLT 444* 397 295   BMET Recent Labs    03/21/22 2037 03/22/22 0458 03/23/22 0534  NA 141 138 144  K 3.6 3.2* 3.9  CL 102 102 109  CO2 20* 21* 25  GLUCOSE 152* 166* 144*  BUN 13 8 <5*  CREATININE 1.26* 0.89 0.64  CALCIUM 9.4 8.7* 8.6*   LFT Recent Labs    03/21/22 2037  PROT 8.2*  ALBUMIN 3.4*  AST 49*  ALT 28  ALKPHOS 110  BILITOT 0.7   PT/INR No results for input(s): "LABPROT", "INR" in the last 72 hours. Hepatitis Panel No results for input(s): "HEPBSAG", "HCVAB", "HEPAIGM", "HEPBIGM" in the last 72 hours.    Studies/Results: DG Abd 1 View  Result Date: 03/23/2022 CLINICAL DATA:  Abdominal distension EXAM: ABDOMEN - 1 VIEW COMPARISON:  03/22/2022 abdominal radiograph FINDINGS: Enteric tube loops in the proximal stomach with the tip overlying the gastric cardia region. No dilated small bowel loops. Mild colonic gas and stool. No evidence of pneumatosis or pneumoperitoneum. IMPRESSION: Enteric tube loops in the proximal stomach with the tip overlying the gastric cardia region. Nonobstructive bowel gas pattern. Electronically Signed   By: Ilona Sorrel M.D.   On: 03/23/2022 09:15   DG Chest Port 1 View  Result Date: 03/23/2022 CLINICAL DATA:  51 year old female with history of respiratory complication. EXAM: PORTABLE CHEST 1 VIEW COMPARISON:  Chest x-ray 03/03/2022. FINDINGS: Interval placement of a nasogastric tube which is coiled in the stomach, with tip directed adjacent to the gastroesophageal junction. Lung volumes are low. Bibasilar opacities favored to reflect areas of subsegmental atelectasis. No pleural effusions. No pneumothorax. No evidence of pulmonary edema. Heart size is normal.  Mediastinal contours are distorted by patient positioning. IMPRESSION: 1. Support apparatus, as above. 2. Low lung volumes with probable bibasilar subsegmental atelectasis. Electronically Signed   By: Vinnie Langton M.D.   On: 03/23/2022 06:04   DG Abd Portable 1 View  Result Date: 03/22/2022 CLINICAL DATA:  NG tube placement. EXAM: PORTABLE ABDOMEN - 1 VIEW COMPARISON:  Radiograph earlier today. FINDINGS: The tip and side port of the enteric tube are now below the diaphragm in the stomach. There is a generalized paucity of upper abdominal bowel gas. IMPRESSION: Tip and side port of the enteric tube below the diaphragm in the stomach. Electronically Signed   By: Keith Rake M.D.   On: 03/22/2022 10:30   DG Abd Portable 1 View  Result Date: 03/22/2022 CLINICAL DATA:  NGT placement EXAM: PORTABLE ABDOMEN - 1 VIEW COMPARISON:  7:51 a.m. FINDINGS: Limited examination including in the upper abdomen demonstrates a NG tube superimposed with the epigastric region. The tip may be in the distal esophagus. IMPRESSION: NG tube tip at the level of the distal esophagus. The NG tube should be advanced. Electronically Signed   By: Sammie Bench M.D.   On: 03/22/2022 09:56   DG Abd Portable 1 View  Result Date: 03/22/2022 CLINICAL DATA:  NG tube placement. EXAM: PORTABLE ABDOMEN - 1 VIEW COMPARISON:  03/22/2022 FINDINGS: NG tube is not identified within the mediastinum or stomach. Film over exposed in the LEFT chest. IMPRESSION: NG tube not identified in the mediastinum. Electronically Signed   By: [name] M.D.   On: 03/22/2022 08:00   DG Abd 1 View  Result Date: 03/22/2022 CLINICAL DATA:  Diffuse abdominal pain EXAM: ABDOMEN - 1 VIEW COMPARISON:  CT from yesterday FINDINGS: The bowel gas pattern is normal. No radio-opaque calculi or other significant radiographic abnormality are seen. IMPRESSION: Negative. Electronically Signed   By: Jorje Guild M.D.   On: 03/22/2022 07:10   CT ABDOMEN  PELVIS W CONTRAST  Result Date: 03/22/2022 CLINICAL DATA:  Acute abdominal pain. Vomiting and diarrhea onset this morning. Dysuria also. EXAM: CT ABDOMEN AND PELVIS WITH CONTRAST TECHNIQUE: Multidetector CT imaging of the abdomen and pelvis was performed using the standard protocol following bolus administration of intravenous contrast. RADIATION DOSE REDUCTION: This exam was performed according to the departmental dose-optimization program which includes automated exposure control, adjustment of the mA and/or kV according to patient size and/or use of iterative reconstruction technique. CONTRAST:  151m OMNIPAQUE IOHEXOL 300 MG/ML  SOLN COMPARISON:  Multiple prior CTs dating back to 2016. The 2 most recent are CTs with contrast 03/03/2022 and 02/03/2022. FINDINGS: Lower chest: No acute abnormality. Chronic elevation right hemidiaphragm. There is a tiny hiatal hernia. Partially healed right seventh through ninth rib fractures are again shown. Hepatobiliary: There is mild-to-moderate hepatic steatosis with liver length of 18.5 cm. There is no mass enhancement. Unremarkable gallbladder and bile ducts. Pancreas: Partially atrophic, otherwise unremarkable. Spleen: Homogeneous enhancement.  No splenomegaly. Adrenals/Urinary Tract: There is no adrenal mass. There is homogeneous renal cortical enhancement. There is no urinary stone or obstruction. The bladder is contracted around a Foley balloon and not well seen. There is symmetric excretion in the delayed imaging. Stomach/Bowel: The stomach,  small bowel and appendix are unremarkable. There is disproportionate thickening and faint stranding involving the ascending colon and proximal transverse segment. Rest of the colon wall is unremarkable. Findings consistent with ascending and proximal transverse colitis. Etiology is likely infectious or inflammatory. There is normal opacification of the SMA, IMA, and SMV and branch vessels and no wall pneumatosis.  Vascular/Lymphatic: Aortic atherosclerosis. No enlarged abdominal or pelvic lymph nodes. Reproductive: Uterus and bilateral adnexa are unremarkable. Other: There are small umbilical and inguinal fat hernias. There is no incarcerated hernia, no free air, free hemorrhage or free fluid. Musculoskeletal: No acute or significant osseous findings. Mild chronic wedging T11 vertebral body. Healing right lower rib cage fractures as above. Central endplate compression deformity again noted at L3. IMPRESSION: 1. Evidence of ascending and proximal transverse colitis, most likely infectious or inflammatory. Ischemic colitis is not not strictly excluded but there is normal opacification of the SMA, IMA, and SMV and branch vessels, therefore it is considered less likely. 2. Aortic atherosclerosis. 3. Fatty liver. 4. Small hiatal hernia. 5. Umbilical and inguinal fat hernias. 6. Healing right lower rib cage fractures. Aortic Atherosclerosis (ICD10-I70.0). Electronically Signed   By: Telford Nab M.D.   On: 03/22/2022 00:21    IMPRESSION/PLAN:  20) 51 year old female with cyclic nausea vomiting syndrome admitted to the hospital with intractable nausea and vomiting. Suspect narcotic associated gastroparesis. GT to LIS.  Approximately 150 cc of light beige NG tube drainage today.  -Continue Reglan 10 mg IV every 6 hours -Ondansetron 4 mg IV every 6 hours as needed -Okay to clamp NG tube during trial of clear liquids, if vomiting recurs then reconnect NG tube to low intermittent suction  2) Diarrhea with central lower abdominal pain. CTAP showed colitis to the transverse and descending colon. On Zosyn IV. C. Diff quick screen ordered, not yet collected.  -Continue Zosyn IV for now -Await C. Difficile results, specimen not yet collected -GI pathogen panel -Eventual diagnostic colonoscopy, likely as an outpatient. Consider inpatient procedure if her symptoms worsen and if infectious diarrhea  ruled out -Await further  recommendations per Dr. Lyndel Safe  3) Leukocytosis, likely due to colitis. WBC 20.5 -> 16.9. She is afebrile.   4) History of GERD, Barrett's esophagus  -PPI IV Q 24 hrs  5) History of 2 tubular adenomatous polyps per colonoscopy 02/2019, mild inflammation was found in the rectum, path report without evidence of active inflammation or colitis. -Next colon polyp surveillance colonoscopy due 02/2026  6) Mildly elevated AST level. Normal T. Bili, Alk phos and Alt levels. Hepatic steatosis per CT.   7) Chronic pain syndrome on chronic opioids per pain management   Noralyn Pick  03/23/2022, 1:20PM   Attending physician's note   I have taken history, reviewed the chart and examined the patient. I performed a substantive portion of this encounter, including complete performance of at least one of the key components, in conjunction with the APP. I agree with the Advanced Practitioner's note, impression and recommendations.   Cyclical vomiting syndrome with assoc narcotic induced gastroparesis (likely).  Colitis on CT- likely ischemic, r/o infectious.  Stool studies not sent since diarrhea has resolved. Patent SMA/IMA on CT. She has H/O OIC  H/O polyps (tubular adenomas)on colon 02/2019  Chronic pain syndrome on Percocet, per pain management.  Plan: -Clamp NG tube.  Start Clear liquid diet.  If tolerates, D/C NG and advance diet. -IV Protonix. -If she has any further BM, then please send it for GI pathogens  including C. Difficile -D/C Zosyn in AM. -Would consider colon as outpt in 6-8 weeks. -No indication for GI intervention currently -Ambulate. -FU GI as outpt in [facility] clinic/Dr Beavers in 3-4 weeks. -Will sign off for now   Carmell Austria, MD [facility] [phone]

## 2022-03-23 NOTE — Plan of Care (Signed)

## 2022-03-23 NOTE — Progress Notes (Addendum)
Provider made aware of patient's blood pressure at rest. Reviewed with provider prn and scheduled medications given to patient. Patient reports 8/10 pain currently.  Provider Zebedee Iba ordered to give prn dilaudid now.

## 2022-03-23 NOTE — Progress Notes (Signed)
PROGRESS NOTE    [name]  LTJ:[id number] DOB: 1970-07-17 DOA: 03/21/2022 PCP: Ferd Hibbs, NP   Brief Narrative:  51 year old female with past medical history diabetes mellitus type 2, hypothyroidism, bipolar disease, PTSD, cyclic vomiting, hypertension, cardiac arrest with MI in 06/2020 which led to anoxic brain injury presented with intractable nausea and vomiting.  On presentation, CT of the abdomen was positive for transverse as well as descending colon colitis.  She was started on IV fluids and antibiotics.  NG tube placed for intractable vomiting.  Assessment & Plan:   Acute colitis Intractable nausea and vomiting -Currently has NG tube which patient apparently pulled out few times. -Imaging showed transverse as well as descending colon colitis.  Currently on IV fluids and Zosyn. -Feels slightly better.  Abdominal exam benign. -Start clear liquid diet and advance as tolerated.  If continues to tolerate clear liquid diet, might remove NG tube -Might need GI eval if symptoms do not improve  Leukocytosis -Improving.  Monitor  Hypomagnesemia -Replace.  Repeat a.m. labs  Hypothyroidism Continue levothyroxine  Diabetes mellitus type 2 -Continue CBGs with SSI  Hypertension -Monitor blood pressure.  Currently on scheduled metoprolol IV.  Will resume home oral meds.  Lactic acidosis--improving  Polysubstance abuse -UDS positive for amphetamines and opiates.  Consult TOC  Hypokalemia -Improved  Bipolar disorder/PTSD -Resume Abilify and trazodone  Hyperlipidemia -Resume Lipitor once able to take orally  History of anoxic brain damage -PT eval.  Outpatient follow-up  DVT prophylaxis: Lovenox Code Status: Full Family Communication: None at bedside Disposition Plan: Status is: Inpatient Remains inpatient appropriate because: Of severity of illness  Consultants: None  Procedures: None  Antimicrobials:  Anti-infectives (From admission, onward)     Start     Dose/Rate Route Frequency Ordered Stop   03/22/22 0800  piperacillin-tazobactam (ZOSYN) IVPB 3.375 g        3.375 g 12.5 mL/hr over 240 Minutes Intravenous Every 8 hours 03/22/22 0226     03/22/22 0000  piperacillin-tazobactam (ZOSYN) IVPB 3.375 g  Status:  Discontinued        3.375 g 100 mL/hr over 30 Minutes Intravenous Every 6 hours 03/21/22 2311 03/22/22 0226        Subjective: Patient seen and examined at bedside.  Awake, slow to respond, poor historian.  Feels slightly better and states that her stools are getting formed.  Feels hungry.  Objective: Vitals:   03/22/22 2340 03/23/22 0330 03/23/22 0450 03/23/22 0458  BP:  (!) 182/100  123/80  Pulse: 100 (!) 107  (!) 106  Resp:  16  20  Temp:  97.6 F (36.4 C)  (!) 97.5 F (36.4 C)  TempSrc:  Oral    SpO2:  94% (!) 82% 97%  Weight:      Height:        Intake/Output Summary (Last 24 hours) at 03/23/2022 1110 Last data filed at 03/23/2022 0800 Gross per 24 hour  Intake 1147.21 ml  Output 1350 ml  Net -202.79 ml   Filed Weights   03/21/22 1654  Weight: 89.4 kg    Examination:  General exam: Appears calm and comfortable.  Looks chronically ill and deconditioned.  Currently on room air ENT: NG tube present Respiratory system: Bilateral decreased breath sounds at bases with scattered crackles Cardiovascular system: S1 & S2 heard, Rate controlled Gastrointestinal system: Abdomen is obese, distended, soft and mildly tender diffusely.  Normal bowel sounds heard. Extremities: No cyanosis, clubbing, edema  Central nervous system: Awake, slow to respond.  Poor historian.  No focal neurological deficits. Moving extremities Skin: No rashes, lesions or ulcers Psychiatry: Flat affect.  No signs of agitation.    Data Reviewed: I have personally reviewed following labs and imaging studies  CBC: Recent Labs  Lab 03/21/22 1911 03/22/22 0458 03/23/22 0534  WBC 19.8* 20.5* 16.9*  NEUTROABS 17.4* 18.1* 13.7*  HGB  16.1* 13.6 12.6  HCT 48.8* 40.9 41.0  MCV 93.3 92.3 99.8  PLT 444* 397 233   Basic Metabolic Panel: Recent Labs  Lab 03/21/22 2037 03/22/22 0458 03/23/22 0534 03/23/22 0544  NA 141 138 144  --   K 3.6 3.2* 3.9  --   CL 102 102 109  --   CO2 20* 21* 25  --   GLUCOSE 152* 166* 144*  --   BUN 13 8 <5*  --   CREATININE 1.26* 0.89 0.64  --   CALCIUM 9.4 8.7* 8.6*  --   MG  --   --   --  1.6*   GFR: Estimated Creatinine Clearance: 91 mL/min (by C-G formula based on SCr of 0.64 mg/dL). Liver Function Tests: Recent Labs  Lab 03/21/22 2037  AST 49*  ALT 28  ALKPHOS 110  BILITOT 0.7  PROT 8.2*  ALBUMIN 3.4*   Recent Labs  Lab 03/21/22 2037  LIPASE 24   No results for input(s): "AMMONIA" in the last 168 hours. Coagulation Profile: No results for input(s): "INR", "PROTIME" in the last 168 hours. Cardiac Enzymes: No results for input(s): "CKTOTAL", "CKMB", "CKMBINDEX", "TROPONINI" in the last 168 hours. BNP (last 3 results) No results for input(s): "PROBNP" in the last 8760 hours. HbA1C: No results for input(s): "HGBA1C" in the last 72 hours. CBG: Recent Labs  Lab 03/22/22 1805 03/22/22 1952 03/22/22 2358 03/23/22 0446 03/23/22 0803  GLUCAP 90 101* 115* 163* 91   Lipid Profile: No results for input(s): "CHOL", "HDL", "LDLCALC", "TRIG", "CHOLHDL", "LDLDIRECT" in the last 72 hours. Thyroid Function Tests: No results for input(s): "TSH", "T4TOTAL", "FREET4", "T3FREE", "THYROIDAB" in the last 72 hours. Anemia Panel: No results for input(s): "VITAMINB12", "FOLATE", "FERRITIN", "TIBC", "IRON", "RETICCTPCT" in the last 72 hours. Sepsis Labs: Recent Labs  Lab 03/21/22 1912 03/21/22 2203 03/22/22 0232 03/22/22 0440  LATICACIDVEN 4.7* 6.5* 4.1* 3.3*    No results found for this or any previous visit (from the past 240 hour(s)).       Radiology Studies: DG Abd 1 View  Result Date: 03/23/2022 CLINICAL DATA:  Abdominal distension EXAM: ABDOMEN - 1 VIEW  COMPARISON:  03/22/2022 abdominal radiograph FINDINGS: Enteric tube loops in the proximal stomach with the tip overlying the gastric cardia region. No dilated small bowel loops. Mild colonic gas and stool. No evidence of pneumatosis or pneumoperitoneum. IMPRESSION: Enteric tube loops in the proximal stomach with the tip overlying the gastric cardia region. Nonobstructive bowel gas pattern. Electronically Signed   By: Ilona Sorrel M.D.   On: 03/23/2022 09:15   DG Chest Port 1 View  Result Date: 03/23/2022 CLINICAL DATA:  51 year old female with history of respiratory complication. EXAM: PORTABLE CHEST 1 VIEW COMPARISON:  Chest x-ray 03/03/2022. FINDINGS: Interval placement of a nasogastric tube which is coiled in the stomach, with tip directed adjacent to the gastroesophageal junction. Lung volumes are low. Bibasilar opacities favored to reflect areas of subsegmental atelectasis. No pleural effusions. No pneumothorax. No evidence of pulmonary edema. Heart size is normal. Mediastinal contours are distorted by patient positioning. IMPRESSION: 1. Support apparatus, as above. 2. Low lung volumes with  probable bibasilar subsegmental atelectasis. Electronically Signed   By: Vinnie Langton M.D.   On: 03/23/2022 06:04   DG Abd Portable 1 View  Result Date: 03/22/2022 CLINICAL DATA:  NG tube placement. EXAM: PORTABLE ABDOMEN - 1 VIEW COMPARISON:  Radiograph earlier today. FINDINGS: The tip and side port of the enteric tube are now below the diaphragm in the stomach. There is a generalized paucity of upper abdominal bowel gas. IMPRESSION: Tip and side port of the enteric tube below the diaphragm in the stomach. Electronically Signed   By: Keith Rake M.D.   On: 03/22/2022 10:30   DG Abd Portable 1 View  Result Date: 03/22/2022 CLINICAL DATA:  NGT placement EXAM: PORTABLE ABDOMEN - 1 VIEW COMPARISON:  7:51 a.m. FINDINGS: Limited examination including in the upper abdomen demonstrates a NG tube superimposed  with the epigastric region. The tip may be in the distal esophagus. IMPRESSION: NG tube tip at the level of the distal esophagus. The NG tube should be advanced. Electronically Signed   By: Sammie Bench M.D.   On: 03/22/2022 09:56   DG Abd Portable 1 View  Result Date: 03/22/2022 CLINICAL DATA:  NG tube placement. EXAM: PORTABLE ABDOMEN - 1 VIEW COMPARISON:  03/22/2022 FINDINGS: NG tube is not identified within the mediastinum or stomach. Film over exposed in the LEFT chest. IMPRESSION: NG tube not identified in the mediastinum. Electronically Signed   By: [name] M.D.   On: 03/22/2022 08:00   DG Abd 1 View  Result Date: 03/22/2022 CLINICAL DATA:  Diffuse abdominal pain EXAM: ABDOMEN - 1 VIEW COMPARISON:  CT from yesterday FINDINGS: The bowel gas pattern is normal. No radio-opaque calculi or other significant radiographic abnormality are seen. IMPRESSION: Negative. Electronically Signed   By: Jorje Guild M.D.   On: 03/22/2022 07:10   CT ABDOMEN PELVIS W CONTRAST  Result Date: 03/22/2022 CLINICAL DATA:  Acute abdominal pain. Vomiting and diarrhea onset this morning. Dysuria also. EXAM: CT ABDOMEN AND PELVIS WITH CONTRAST TECHNIQUE: Multidetector CT imaging of the abdomen and pelvis was performed using the standard protocol following bolus administration of intravenous contrast. RADIATION DOSE REDUCTION: This exam was performed according to the departmental dose-optimization program which includes automated exposure control, adjustment of the mA and/or kV according to patient size and/or use of iterative reconstruction technique. CONTRAST:  149m OMNIPAQUE IOHEXOL 300 MG/ML  SOLN COMPARISON:  Multiple prior CTs dating back to 2016. The 2 most recent are CTs with contrast 03/03/2022 and 02/03/2022. FINDINGS: Lower chest: No acute abnormality. Chronic elevation right hemidiaphragm. There is a tiny hiatal hernia. Partially healed right seventh through ninth rib fractures are again shown.  Hepatobiliary: There is mild-to-moderate hepatic steatosis with liver length of 18.5 cm. There is no mass enhancement. Unremarkable gallbladder and bile ducts. Pancreas: Partially atrophic, otherwise unremarkable. Spleen: Homogeneous enhancement.  No splenomegaly. Adrenals/Urinary Tract: There is no adrenal mass. There is homogeneous renal cortical enhancement. There is no urinary stone or obstruction. The bladder is contracted around a Foley balloon and not well seen. There is symmetric excretion in the delayed imaging. Stomach/Bowel: The stomach, small bowel and appendix are unremarkable. There is disproportionate thickening and faint stranding involving the ascending colon and proximal transverse segment. Rest of the colon wall is unremarkable. Findings consistent with ascending and proximal transverse colitis. Etiology is likely infectious or inflammatory. There is normal opacification of the SMA, IMA, and SMV and branch vessels and no wall pneumatosis. Vascular/Lymphatic: Aortic atherosclerosis. No enlarged abdominal or pelvic lymph nodes.  Reproductive: Uterus and bilateral adnexa are unremarkable. Other: There are small umbilical and inguinal fat hernias. There is no incarcerated hernia, no free air, free hemorrhage or free fluid. Musculoskeletal: No acute or significant osseous findings. Mild chronic wedging T11 vertebral body. Healing right lower rib cage fractures as above. Central endplate compression deformity again noted at L3. IMPRESSION: 1. Evidence of ascending and proximal transverse colitis, most likely infectious or inflammatory. Ischemic colitis is not not strictly excluded but there is normal opacification of the SMA, IMA, and SMV and branch vessels, therefore it is considered less likely. 2. Aortic atherosclerosis. 3. Fatty liver. 4. Small hiatal hernia. 5. Umbilical and inguinal fat hernias. 6. Healing right lower rib cage fractures. Aortic Atherosclerosis (ICD10-I70.0). Electronically Signed    By: Telford Nab M.D.   On: 03/22/2022 00:21        Scheduled Meds:  Chlorhexidine Gluconate Cloth  6 each Topical Daily   enoxaparin (LOVENOX) injection  40 mg Subcutaneous Q24H   insulin aspart  0-15 Units Subcutaneous Q4H   levothyroxine  50 mcg Per Tube Q0600   metoprolol tartrate  10 mg Intravenous Q6H   tiZANidine  4 mg Per Tube TID   Continuous Infusions:  sodium chloride 100 mL/hr at 03/23/22 0305   lactated ringers Stopped (03/22/22 1610)   magnesium sulfate bolus IVPB     piperacillin-tazobactam (ZOSYN)  IV 3.375 g (03/23/22 0845)          Aline August, MD [facility] 03/23/2022, 11:10 AM

## 2022-03-24 LAB — CBC WITH DIFFERENTIAL/PLATELET
Abs Immature Granulocytes: 0.06 10*3/uL (ref 0.00–0.07)
Basophils Absolute: 0.1 10*3/uL (ref 0.0–0.1)
Basophils Relative: 1 %
Eosinophils Absolute: 0.2 10*3/uL (ref 0.0–0.5)
Eosinophils Relative: 3 %
HCT: 36.8 % (ref 36.0–46.0)
Hemoglobin: 11.4 g/dL — ABNORMAL LOW (ref 12.0–15.0)
Immature Granulocytes: 1 %
Lymphocytes Relative: 16 %
Lymphs Abs: 1.4 10*3/uL (ref 0.7–4.0)
MCH: 30.4 pg (ref 26.0–34.0)
MCHC: 31 g/dL (ref 30.0–36.0)
MCV: 98.1 fL (ref 80.0–100.0)
Monocytes Absolute: 0.7 10*3/uL (ref 0.1–1.0)
Monocytes Relative: 8 %
Neutro Abs: 6.4 10*3/uL (ref 1.7–7.7)
Neutrophils Relative %: 71 %
Platelets: 187 10*3/uL (ref 150–400)
RBC: 3.75 MIL/uL — ABNORMAL LOW (ref 3.87–5.11)
RDW: 14.4 % (ref 11.5–15.5)
WBC: 8.8 10*3/uL (ref 4.0–10.5)
nRBC: 0 % (ref 0.0–0.2)

## 2022-03-24 LAB — COMPREHENSIVE METABOLIC PANEL
ALT: 19 U/L (ref 0–44)
AST: 36 U/L (ref 15–41)
Albumin: 2 g/dL — ABNORMAL LOW (ref 3.5–5.0)
Alkaline Phosphatase: 60 U/L (ref 38–126)
Anion gap: 9 (ref 5–15)
BUN: <5 mg/dL — ABNORMAL LOW (ref 6–20)
CO2: 23 mmol/L (ref 22–32)
Calcium: 7.8 mg/dL — ABNORMAL LOW (ref 8.9–10.3)
Chloride: 110 mmol/L (ref 98–111)
Creatinine, Ser: 0.76 mg/dL (ref 0.44–1.00)
GFR, Estimated: >60 mL/min (ref >60)
Glucose, Bld: 131 mg/dL — ABNORMAL HIGH (ref 70–99)
Potassium: 3.8 mmol/L (ref 3.5–5.1)
Sodium: 142 mmol/L (ref 135–145)
Total Bilirubin: 0.7 mg/dL (ref 0.3–1.2)
Total Protein: 5.1 g/dL — ABNORMAL LOW (ref 6.5–8.1)

## 2022-03-24 LAB — GLUCOSE, CAPILLARY
Glucose-Capillary: 100 mg/dL — ABNORMAL HIGH (ref 70–99)
Glucose-Capillary: 108 mg/dL — ABNORMAL HIGH (ref 70–99)
Glucose-Capillary: 123 mg/dL — ABNORMAL HIGH (ref 70–99)
Glucose-Capillary: 92 mg/dL (ref 70–99)
Glucose-Capillary: 96 mg/dL (ref 70–99)
Glucose-Capillary: 98 mg/dL (ref 70–99)

## 2022-03-24 LAB — MAGNESIUM: Magnesium: 2 mg/dL (ref 1.7–2.4)

## 2022-03-24 MED ORDER — POLYETHYLENE GLYCOL 3350 17 G PO PACK: 17.0000 g | PACK | Freq: Every day | ORAL | Status: DC

## 2022-03-24 MED ORDER — POLYETHYLENE GLYCOL 3350 17 G PO PACK: 17.0000 g | PACK | Freq: Every day | ORAL | Status: DC | PRN

## 2022-03-24 NOTE — Progress Notes (Signed)
  Transition of Care [facility]) Screening Note   Patient Details  Name: [name] Date of Birth: [birth year]   Transition of Care [facility]) CM/SW Contact:    Vassie Moselle, LCSW Phone Number: 03/24/2022, 11:01 AM    Transition of Care Department [facility]) has reviewed patient and no TOC needs have been identified at this time. We will continue to monitor patient advancement through interdisciplinary progression rounds. If new patient transition needs arise, please place a TOC consult.

## 2022-03-24 NOTE — Progress Notes (Signed)
[facility]                                                                              [name], is a 51 y.o. female, DOB - September 26, 1970, OEV:[id number] Admit date - 03/21/2022    Outpatient Primary MD for the patient is Ferd Hibbs, NP  LOS - 2  days  Chief Complaint  Patient presents with   Emesis       Brief summary   51 year old female with past medical history diabetes mellitus type 2, hypothyroidism, bipolar disease, PTSD, cyclic vomiting, hypertension, cardiac arrest with MI in 06/2020 which led to anoxic brain injury presented with intractable nausea and vomiting.  On presentation, CT of the abdomen was positive for transverse as well as descending colon colitis.  She was started on IV fluids and antibiotics.  NG tube placed for intractable vomiting.  Patient was admitted for further work-up.  Assessment & Plan    Principal Problem: Acute colitis -CT abdomen showed transverse colon as well as descending colon colitis -Patient was placed on n.p.o. status, IV fluids, NG tube was placed -NG tube clamped on 12/1 and patient was started on clear liquid diet -Patient has done well and will remove NG tube today, advance diet to full liquids -If tolerated, will advance to soft solids tonight    Active problems Hypothyroidism -Continue levothyroxine  Diabetes mellitus type 2, NIDDM -Continue sliding scale insulin while inpatient -Hold metformin Recent Labs    03/23/22 1151 03/23/22 1652 03/23/22 2042 03/24/22 0037 03/24/22 0423 03/24/22 0806  GLUCAP 100* 101* 94 100* 108* 96   CBGs controlled   Essential hypertension -BP stable, continue Coreg and amlodipine   Lactic acidosis -Improving, DC IV fluids once tolerating diet  Polysubstance use -UDS positive for amphetamines and opiates, counseled  Bipolar disorder/PTSD -Continue Abilify, trazodone  History of anoxic brain injury -No acute issues  HLP -Will resume Lipitor at  discharge  Obesity Estimated body mass index is 33.29 kg/m as calculated from the following:   Height as of this encounter: 5' 4.5" (1.638 m).   Weight as of this encounter: 89.4 kg.  Code Status: Full CODE STATUS DVT Prophylaxis:  enoxaparin (LOVENOX) injection 40 mg Start: 03/22/22 1000 SCDs Start: 03/22/22 0641   Level of Care: Level of care: Telemetry Family Communication: Updated patient Disposition Plan:      Remains inpatient appropriate: DC home tomorrow if tolerating solid diet   Procedures:  None  Consultants:   None  Antimicrobials:   Anti-infectives (From admission, onward)    Start     Dose/Rate Route Frequency Ordered Stop   03/22/22 0800  piperacillin-tazobactam (ZOSYN) IVPB 3.375 g        3.375 g 12.5 mL/hr over 240 Minutes Intravenous Every 8 hours 03/22/22 0226     03/22/22 0000  piperacillin-tazobactam (ZOSYN) IVPB 3.375 g  Status:  Discontinued        3.375 g 100 mL/hr over 30 Minutes Intravenous Every 6 hours 03/21/22 2311 03/22/22 0226          Medications  amLODipine  10 mg Oral Daily   ARIPiprazole  5 mg Oral  Daily   carvedilol  12.5 mg Oral BID WC   Chlorhexidine Gluconate Cloth  6 each Topical Daily   enoxaparin (LOVENOX) injection  40 mg Subcutaneous Q24H   insulin aspart  0-15 Units Subcutaneous Q4H   levothyroxine  50 mcg Per Tube Q0600   metoCLOPramide (REGLAN) injection  10 mg Intravenous Q6H   pantoprazole (PROTONIX) IV  40 mg Intravenous Q24H   polyethylene glycol  17 g Oral Daily   prazosin  2 mg Oral QHS   tiZANidine  4 mg Per Tube TID   traZODone  150 mg Oral QHS      Subjective:   [name] was seen and examined today.  NG clamped since yesterday, tolerated clear liquid diet.  No acute nausea vomiting, abdominal pain or any diarrhea.  No fevers.    Objective:   Vitals:   03/23/22 0458 03/23/22 1415 03/23/22 2045 03/24/22 0426  BP: 123/80 (!) 142/99 130/83 (!) 101/57  Pulse: (!) 106 (!) 107 88 79  Resp: '20  16 18 20  '$ Temp: (!) 97.5 F (36.4 C) 98.4 F (36.9 C) 98.1 F (36.7 C) 98.3 F (36.8 C)  TempSrc:  Oral Oral Oral  SpO2: 97% 98% 98% 99%  Weight:      Height:        Intake/Output Summary (Last 24 hours) at 03/24/2022 1010 Last data filed at 03/24/2022 0300 Gross per 24 hour  Intake 1782.25 ml  Output 1050 ml  Net 732.25 ml     Wt Readings from Last 3 Encounters:  03/21/22 89.4 kg  03/03/22 89.4 kg  02/02/22 89.4 kg     Exam General: Alert and oriented, NAD, NG+ clamped Cardiovascular: S1 S2 auscultated,  RRR Respiratory: Clear to auscultation bilaterally, no wheezing Gastrointestinal: Soft, nontender, nondistended, + bowel sounds Ext: no pedal edema bilaterally Neuro: Strength 5/5 upper and lower extremities bilaterally Psych: Normal affect and demeanor,    Data Reviewed:  I have personally reviewed following labs    CBC Lab Results  Component Value Date   WBC 8.8 03/24/2022   RBC 3.75 (L) 03/24/2022   HGB 11.4 (L) 03/24/2022   HCT 36.8 03/24/2022   MCV 98.1 03/24/2022   MCH 30.4 03/24/2022   PLT 187 03/24/2022   MCHC 31.0 03/24/2022   RDW 14.4 03/24/2022   LYMPHSABS 1.4 03/24/2022   MONOABS 0.7 03/24/2022   EOSABS 0.2 03/24/2022   BASOSABS 0.1 99/24/2683     Last metabolic panel Lab Results  Component Value Date   NA 144 03/23/2022   K 3.9 03/23/2022   CL 109 03/23/2022   CO2 25 03/23/2022   BUN <5 (L) 03/23/2022   CREATININE 0.64 03/23/2022   GLUCOSE 144 (H) 03/23/2022   GFRNONAA >60 03/23/2022   GFRAA >60 05/05/2019   CALCIUM 8.6 (L) 03/23/2022   PHOS 3.3 09/21/2021   PROT 8.2 (H) 03/21/2022   ALBUMIN 3.4 (L) 03/21/2022   LABGLOB 3.8 01/12/2015   AGRATIO 0.8 01/12/2015   BILITOT 0.7 03/21/2022   ALKPHOS 110 03/21/2022   AST 49 (H) 03/21/2022   ALT 28 03/21/2022   ANIONGAP 10 03/23/2022    CBG (last 3)  Recent Labs    03/24/22 0037 03/24/22 0423 03/24/22 0806  GLUCAP 100* 108* 96      Coagulation Profile: No results for  input(s): "INR", "PROTIME" in the last 168 hours.   Radiology Studies: I have personally reviewed the imaging studies  DG Abd 1 View  Result Date: 03/23/2022 CLINICAL DATA:  Abdominal distension EXAM: ABDOMEN - 1 VIEW COMPARISON:  03/22/2022 abdominal radiograph FINDINGS: Enteric tube loops in the proximal stomach with the tip overlying the gastric cardia region. No dilated small bowel loops. Mild colonic gas and stool. No evidence of pneumatosis or pneumoperitoneum. IMPRESSION: Enteric tube loops in the proximal stomach with the tip overlying the gastric cardia region. Nonobstructive bowel gas pattern. Electronically Signed   By: Ilona Sorrel M.D.   On: 03/23/2022 09:15   DG Chest Port 1 View  Result Date: 03/23/2022 CLINICAL DATA:  51 year old female with history of respiratory complication. EXAM: PORTABLE CHEST 1 VIEW COMPARISON:  Chest x-ray 03/03/2022. FINDINGS: Interval placement of a nasogastric tube which is coiled in the stomach, with tip directed adjacent to the gastroesophageal junction. Lung volumes are low. Bibasilar opacities favored to reflect areas of subsegmental atelectasis. No pleural effusions. No pneumothorax. No evidence of pulmonary edema. Heart size is normal. Mediastinal contours are distorted by patient positioning. IMPRESSION: 1. Support apparatus, as above. 2. Low lung volumes with probable bibasilar subsegmental atelectasis. Electronically Signed   By: Vinnie Langton M.D.   On: 03/23/2022 06:04   DG Abd Portable 1 View  Result Date: 03/22/2022 CLINICAL DATA:  NG tube placement. EXAM: PORTABLE ABDOMEN - 1 VIEW COMPARISON:  Radiograph earlier today. FINDINGS: The tip and side port of the enteric tube are now below the diaphragm in the stomach. There is a generalized paucity of upper abdominal bowel gas. IMPRESSION: Tip and side port of the enteric tube below the diaphragm in the stomach. Electronically Signed   By: Keith Rake M.D.   On: 03/22/2022 10:30        Vertis Bauder M.D. [facility] 03/24/2022, 10:10 AM  Available via Epic secure chat 7am-7pm After 7 pm, please refer to night coverage provider listed on amion.

## 2022-03-25 DIAGNOSIS — N179 Acute kidney failure, unspecified: Secondary | ICD-10-CM

## 2022-03-25 LAB — CBC
HCT: 32 % — ABNORMAL LOW (ref 36.0–46.0)
Hemoglobin: 10.2 g/dL — ABNORMAL LOW (ref 12.0–15.0)
MCH: 30.6 pg (ref 26.0–34.0)
MCHC: 31.9 g/dL (ref 30.0–36.0)
MCV: 96.1 fL (ref 80.0–100.0)
Platelets: 185 10*3/uL (ref 150–400)
RBC: 3.33 MIL/uL — ABNORMAL LOW (ref 3.87–5.11)
RDW: 14 % (ref 11.5–15.5)
WBC: 6 10*3/uL (ref 4.0–10.5)
nRBC: 0 % (ref 0.0–0.2)

## 2022-03-25 LAB — GASTROINTESTINAL PANEL BY PCR, STOOL (REPLACES STOOL CULTURE)

## 2022-03-25 LAB — BASIC METABOLIC PANEL
Anion gap: 5 (ref 5–15)
BUN: 6 mg/dL (ref 6–20)
CO2: 26 mmol/L (ref 22–32)
Calcium: 7.7 mg/dL — ABNORMAL LOW (ref 8.9–10.3)
Chloride: 111 mmol/L (ref 98–111)
Creatinine, Ser: 0.56 mg/dL (ref 0.44–1.00)
GFR, Estimated: >60 mL/min (ref >60)
Glucose, Bld: 104 mg/dL — ABNORMAL HIGH (ref 70–99)
Potassium: 3.1 mmol/L — ABNORMAL LOW (ref 3.5–5.1)
Sodium: 142 mmol/L (ref 135–145)

## 2022-03-25 LAB — GLUCOSE, CAPILLARY
Glucose-Capillary: 109 mg/dL — ABNORMAL HIGH (ref 70–99)
Glucose-Capillary: 111 mg/dL — ABNORMAL HIGH (ref 70–99)
Glucose-Capillary: 85 mg/dL (ref 70–99)

## 2022-03-25 MED ORDER — AMLODIPINE BESYLATE 10 MG PO TABS: 10.0000 mg | ORAL_TABLET | Freq: Every day | ORAL | 1 refills | Status: DC

## 2022-03-25 MED ORDER — HYDROCODONE-ACETAMINOPHEN 10-325 MG PO TABS: 1.0000 | ORAL_TABLET | Freq: Four times a day (QID) | ORAL | 0 refills | Status: DC | PRN

## 2022-03-25 MED ORDER — POTASSIUM CHLORIDE CRYS ER 20 MEQ PO TBCR
40.0000 meq | EXTENDED_RELEASE_TABLET | Freq: Once | ORAL | Status: AC
  Administered 2022-03-25: 40 meq via ORAL
  Filled 2022-03-25: qty 2

## 2022-03-25 MED ORDER — LISINOPRIL 10 MG PO TABS: 10.0000 mg | ORAL_TABLET | Freq: Every day | ORAL | 1 refills | Status: DC

## 2022-03-25 MED ORDER — METOCLOPRAMIDE HCL 5 MG PO TABS: 5.0000 mg | ORAL_TABLET | Freq: Three times a day (TID) | ORAL | 0 refills | Status: DC

## 2022-03-25 MED ORDER — ONDANSETRON 8 MG PO TBDP: 8.0000 mg | ORAL_TABLET | Freq: Three times a day (TID) | ORAL | 0 refills | Status: DC | PRN

## 2022-03-25 MED ORDER — TIZANIDINE HCL 4 MG PO TABS: 12.0000 mg | ORAL_TABLET | Freq: Three times a day (TID) | ORAL | 0 refills | Status: DC | PRN

## 2022-03-25 NOTE — Discharge Summary (Signed)
Physician Discharge Summary   Patient: [name] MRN: [id number] DOB: [birth year]  Admit date:     03/21/2022  Discharge date: 03/25/22  Discharge Physician: Estill Cotta, MD    PCP: Ferd Hibbs, NP   Recommendations at discharge:   Recommended outpatient follow-up in 1 to 2 weeks, obtain BMET, adjust antihypertensives Lisinopril held  Discharge Diagnoses:    Acute colitis with nausea and vomiting, resolved Acute kidney injury  lactic acidosis, resolved    Bipolar affective disorder ([place])   Essential hypertension   T2DM (type 2 diabetes mellitus) ([place])   Hyperlipidemia   Hypothyroidism   PTSD (post-traumatic stress disorder)   Chronic lower back pain   Iron deficiency   History of depression   Anoxic brain damage ([place])   Dehydration    Hospital Course:   Assessment and Plan:  Acute colitis -CT abdomen showed transverse colon as well as descending colon colitis -Patient was placed on n.p.o. status, IV fluids, NG tube was placed -NG tube clamped on 12/1 and patient was started on clear liquid diet -Patient has done well, NG tube removed on 12/2, currently tolerating soft diet.     Acute kidney injury with lactic acidosis, dehydration -Presented with creatinine of 1.26, lactic acid 6.5, improved to 3.3 -Creatinine improved to 0.5 at the time of discharge. -Continue to hold lisinopril, recheck Bmet at follow-up  Hypokalemia -Replaced  Hypothyroidism -Continue levothyroxine   Diabetes mellitus type 2, NIDDM -Patient was placed on sliding scale insulin while inpatient, CBGs stable -Hemoglobin A1c 5.3 on 03/04/2022, continue metformin   Essential hypertension -BP stable, continue Coreg, resume amlodipine on 12/5 - hold lisinopril due to AKI     Polysubstance use -UDS positive for amphetamines and opiates, counseled   Bipolar disorder/PTSD -Continue Abilify, trazodone   History of anoxic brain injury -No acute issues   HLP -Continue  Lipitor   Obesity Estimated body mass index is 33.29 kg/m as calculated from the following:   Height as of this encounter: 5' 4.5" (1.638 m).   Weight as of this encounter: 89.4 kg.     Pain control - [employer] Controlled Substance Reporting System database was reviewed. and patient was instructed, not to drive, operate heavy machinery, perform activities at heights, swimming or participation in water activities or provide baby-sitting services while on Pain, Sleep and Anxiety Medications; until their outpatient Physician has advised to do so again. Also recommended to not to take more than prescribed Pain, Sleep and Anxiety Medications.  Consultants: None Procedures performed: None Disposition: Home Diet recommendation:  Discharge Diet Orders (From admission, onward)     Start     Ordered   03/25/22 0000  Diet Carb Modified        03/25/22 1013           Carb modified diet DISCHARGE MEDICATION: Allergies as of 03/25/2022   No Known Allergies      Medication List     TAKE these medications    acetaminophen 325 MG tablet Commonly known as: TYLENOL Take 2 tablets (650 mg total) by mouth every 6 (six) hours as needed for mild pain, fever or headache. What changed: reasons to take this   amLODipine 10 MG tablet Commonly known as: NORVASC Take 1 tablet (10 mg total) by mouth daily. Hold for next 2 days Start taking on: March 27, 2022 What changed:  additional instructions These instructions start on March 27, 2022. If you are unsure what to do until then, ask your doctor  or other care provider.   ARIPiprazole 5 MG tablet Commonly known as: ABILIFY Take 1 tablet (5 mg total) by mouth daily.   atorvastatin 10 MG tablet Commonly known as: LIPITOR Take 10 mg by mouth daily.   carvedilol 12.5 MG tablet Commonly known as: COREG Take 1 tablet (12.5 mg total) by mouth 2 (two) times daily with a meal.   FLUoxetine 40 MG capsule Commonly known as: PROZAC Take  1 capsule (40 mg total) by mouth daily.   HYDROcodone-acetaminophen 10-325 MG tablet Commonly known as: NORCO Take 1 tablet by mouth every 6 (six) hours as needed for severe pain.   levothyroxine 50 MCG tablet Commonly known as: SYNTHROID Take 50 mcg by mouth daily.   lidocaine 5 % ointment Commonly known as: XYLOCAINE Apply 1 Application topically daily as needed for mild pain.   lisinopril 10 MG tablet Commonly known as: ZESTRIL Take 1 tablet (10 mg total) by mouth daily. HOLD until follow-up with your doctor What changed: additional instructions   metFORMIN 500 MG tablet Commonly known as: GLUCOPHAGE Take 500 mg by mouth 2 (two) times daily with a meal.   metoCLOPramide 5 MG tablet Commonly known as: REGLAN Take 1 tablet (5 mg total) by mouth 3 (three) times daily before meals. What changed: when to take this   multivitamin Tabs tablet Take 1 tablet by mouth at bedtime.   ondansetron 8 MG disintegrating tablet Commonly known as: ZOFRAN-ODT Take 1 tablet (8 mg total) by mouth every 8 (eight) hours as needed for nausea or vomiting.   pantoprazole 40 MG tablet Commonly known as: Protonix Take 1 tablet (40 mg total) by mouth daily.   prazosin 2 MG capsule Commonly known as: MINIPRESS Take 1 capsule (2 mg total) by mouth at bedtime.   promethazine 25 MG suppository Commonly known as: PHENERGAN Place 1 suppository (25 mg total) rectally every 6 (six) hours as needed for nausea or vomiting.   tiZANidine 4 MG tablet Commonly known as: ZANAFLEX Take 3 tablets (12 mg total) by mouth every 8 (eight) hours as needed for muscle spasms.   traZODone 50 MG tablet Commonly known as: DESYREL Take 3 tablets (150 mg total) by mouth at bedtime.        Follow-up Information     Ferd Hibbs, NP. Schedule an appointment as soon as possible for a visit in 2 week(s).   Specialty: Nurse Practitioner Why: for hospital follow-up Contact information: [place] [address] [phone]                Discharge Exam: Danley Danker Weights   03/21/22 1654  Weight: 89.4 kg   S: No acute nausea and vomiting, tolerating solid diet without any difficulty.  No fevers or chills, abdominal pain.  Vitals:   03/24/22 1357 03/24/22 2013 03/24/22 2100 03/25/22 0450  BP: 108/69 (!) 92/59 115/71 95/79  Pulse:  70 70 65  Resp:  18  18  Temp:  97.9 F (36.6 C)  98.2 F (36.8 C)  TempSrc:  Oral  Oral  SpO2:  94%  91%  Weight:      Height:         Physical Exam General: Alert and oriented x 3, NAD Cardiovascular: S1 S2 clear, RRR.  Respiratory: CTAB, no wheezing, rales or rhonchi Gastrointestinal: Soft, nontender, nondistended, NBS Ext: no pedal edema bilaterally Neuro: no new deficits Psych: Normal affect and demeanor, alert and oriented x3    Condition at discharge: fair  The results of significant diagnostics from this hospitalization (including imaging, microbiology, ancillary and laboratory) are listed below for reference.   Imaging Studies: DG Abd 1 View  Result Date: 03/23/2022 CLINICAL DATA:  Abdominal distension EXAM: ABDOMEN - 1 VIEW COMPARISON:  03/22/2022 abdominal radiograph FINDINGS: Enteric tube loops in the proximal stomach with the tip overlying the gastric cardia region. No dilated small bowel loops. Mild colonic gas and stool. No evidence of pneumatosis or pneumoperitoneum. IMPRESSION: Enteric tube loops in the proximal stomach with the tip overlying the gastric cardia region. Nonobstructive bowel gas pattern. Electronically Signed   By: Ilona Sorrel M.D.   On: 03/23/2022 09:15   DG Chest Port 1 View  Result Date: 03/23/2022 CLINICAL DATA:  51 year old female with history of respiratory complication. EXAM: PORTABLE CHEST 1 VIEW COMPARISON:  Chest x-ray 03/03/2022. FINDINGS: Interval placement of a nasogastric tube which is coiled in the stomach, with tip directed adjacent to the gastroesophageal junction. Lung volumes are  low. Bibasilar opacities favored to reflect areas of subsegmental atelectasis. No pleural effusions. No pneumothorax. No evidence of pulmonary edema. Heart size is normal. Mediastinal contours are distorted by patient positioning. IMPRESSION: 1. Support apparatus, as above. 2. Low lung volumes with probable bibasilar subsegmental atelectasis. Electronically Signed   By: Vinnie Langton M.D.   On: 03/23/2022 06:04   DG Abd Portable 1 View  Result Date: 03/22/2022 CLINICAL DATA:  NG tube placement. EXAM: PORTABLE ABDOMEN - 1 VIEW COMPARISON:  Radiograph earlier today. FINDINGS: The tip and side port of the enteric tube are now below the diaphragm in the stomach. There is a generalized paucity of upper abdominal bowel gas. IMPRESSION: Tip and side port of the enteric tube below the diaphragm in the stomach. Electronically Signed   By: Keith Rake M.D.   On: 03/22/2022 10:30   DG Abd Portable 1 View  Result Date: 03/22/2022 CLINICAL DATA:  NGT placement EXAM: PORTABLE ABDOMEN - 1 VIEW COMPARISON:  7:51 a.m. FINDINGS: Limited examination including in the upper abdomen demonstrates a NG tube superimposed with the epigastric region. The tip may be in the distal esophagus. IMPRESSION: NG tube tip at the level of the distal esophagus. The NG tube should be advanced. Electronically Signed   By: Sammie Bench M.D.   On: 03/22/2022 09:56   DG Abd Portable 1 View  Result Date: 03/22/2022 CLINICAL DATA:  NG tube placement. EXAM: PORTABLE ABDOMEN - 1 VIEW COMPARISON:  03/22/2022 FINDINGS: NG tube is not identified within the mediastinum or stomach. Film over exposed in the LEFT chest. IMPRESSION: NG tube not identified in the mediastinum. Electronically Signed   By: [name] M.D.   On: 03/22/2022 08:00   DG Abd 1 View  Result Date: 03/22/2022 CLINICAL DATA:  Diffuse abdominal pain EXAM: ABDOMEN - 1 VIEW COMPARISON:  CT from yesterday FINDINGS: The bowel gas pattern is normal. No radio-opaque  calculi or other significant radiographic abnormality are seen. IMPRESSION: Negative. Electronically Signed   By: Jorje Guild M.D.   On: 03/22/2022 07:10   CT ABDOMEN PELVIS W CONTRAST  Result Date: 03/22/2022 CLINICAL DATA:  Acute abdominal pain. Vomiting and diarrhea onset this morning. Dysuria also. EXAM: CT ABDOMEN AND PELVIS WITH CONTRAST TECHNIQUE: Multidetector CT imaging of the abdomen and pelvis was performed using the standard protocol following bolus administration of intravenous contrast. RADIATION DOSE REDUCTION: This exam was performed according to the departmental dose-optimization program which includes automated exposure control, adjustment of the mA and/or kV according to  patient size and/or use of iterative reconstruction technique. CONTRAST:  138m OMNIPAQUE IOHEXOL 300 MG/ML  SOLN COMPARISON:  Multiple prior CTs dating back to 2016. The 2 most recent are CTs with contrast 03/03/2022 and 02/03/2022. FINDINGS: Lower chest: No acute abnormality. Chronic elevation right hemidiaphragm. There is a tiny hiatal hernia. Partially healed right seventh through ninth rib fractures are again shown. Hepatobiliary: There is mild-to-moderate hepatic steatosis with liver length of 18.5 cm. There is no mass enhancement. Unremarkable gallbladder and bile ducts. Pancreas: Partially atrophic, otherwise unremarkable. Spleen: Homogeneous enhancement.  No splenomegaly. Adrenals/Urinary Tract: There is no adrenal mass. There is homogeneous renal cortical enhancement. There is no urinary stone or obstruction. The bladder is contracted around a Foley balloon and not well seen. There is symmetric excretion in the delayed imaging. Stomach/Bowel: The stomach, small bowel and appendix are unremarkable. There is disproportionate thickening and faint stranding involving the ascending colon and proximal transverse segment. Rest of the colon wall is unremarkable. Findings consistent with ascending and proximal transverse  colitis. Etiology is likely infectious or inflammatory. There is normal opacification of the SMA, IMA, and SMV and branch vessels and no wall pneumatosis. Vascular/Lymphatic: Aortic atherosclerosis. No enlarged abdominal or pelvic lymph nodes. Reproductive: Uterus and bilateral adnexa are unremarkable. Other: There are small umbilical and inguinal fat hernias. There is no incarcerated hernia, no free air, free hemorrhage or free fluid. Musculoskeletal: No acute or significant osseous findings. Mild chronic wedging T11 vertebral body. Healing right lower rib cage fractures as above. Central endplate compression deformity again noted at L3. IMPRESSION: 1. Evidence of ascending and proximal transverse colitis, most likely infectious or inflammatory. Ischemic colitis is not not strictly excluded but there is normal opacification of the SMA, IMA, and SMV and branch vessels, therefore it is considered less likely. 2. Aortic atherosclerosis. 3. Fatty liver. 4. Small hiatal hernia. 5. Umbilical and inguinal fat hernias. 6. Healing right lower rib cage fractures. Aortic Atherosclerosis (ICD10-I70.0). Electronically Signed   By: KTelford NabM.D.   On: 03/22/2022 00:21   CT Angio Chest Pulmonary Embolism (PE) W or WO Contrast  Result Date: 03/03/2022 CLINICAL DATA:  High clinical suspicion for PE EXAM: CT ANGIOGRAPHY CHEST WITH CONTRAST TECHNIQUE: Multidetector CT imaging of the chest was performed using the standard protocol during bolus administration of intravenous contrast. Multiplanar CT image reconstructions and MIPs were obtained to evaluate the vascular anatomy. RADIATION DOSE REDUCTION: This exam was performed according to the departmental dose-optimization program which includes automated exposure control, adjustment of the mA and/or kV according to patient size and/or use of iterative reconstruction technique. CONTRAST:  760mOMNIPAQUE IOHEXOL 350 MG/ML SOLN COMPARISON:  02/03/2022 FINDINGS: Cardiovascular:  There are no intraluminal filling defects in pulmonary artery branches. There is homogeneous enhancement in thoracic aorta. Mediastinum/Nodes: No significant lymphadenopathy seen. Lungs/Pleura: There is no focal pulmonary consolidation. There is no pleural effusion or pneumothorax. Upper Abdomen: There is fatty infiltration in liver. High density in the lumen of gallbladder may be due to vicarious excretion of contrast administered earlier today. There is no wall thickening in gallbladder. There is no fluid around the gallbladder. There is small diaphragmatic hernia in the medial aspect of left hemidiaphragm containing portion of stomach. This finding has not changed. Musculoskeletal: Healing fractures are seen in the right seventh through the tenth ribs. Review of the MIP images confirms the above findings. IMPRESSION: There is no evidence of pulmonary artery embolism. There is no evidence of thoracic aortic dissection. There is no focal pulmonary  consolidation. Fatty liver. Small hernia is seen in the medial aspect of left hemidiaphragm with no significant interval change. Healing fractures in right lower ribs. Electronically Signed   By: Elmer Picker M.D.   On: 03/03/2022 14:51   CT Abdomen Pelvis W Contrast  Result Date: 03/03/2022 CLINICAL DATA:  51 year old female with acute abdominal pain, nausea vomiting. EXAM: CT ABDOMEN AND PELVIS WITH CONTRAST TECHNIQUE: Multidetector CT imaging of the abdomen and pelvis was performed using the standard protocol following bolus administration of intravenous contrast. RADIATION DOSE REDUCTION: This exam was performed according to the departmental dose-optimization program which includes automated exposure control, adjustment of the mA and/or kV according to patient size and/or use of iterative reconstruction technique. CONTRAST:  187m OMNIPAQUE IOHEXOL 300 MG/ML  SOLN COMPARISON:  CT Chest, Abdomen, and Pelvis today are reported separately. 02/03/2022 and  earlier. FINDINGS: Lower chest: No cardiomegaly or pericardial effusion. No lung base pleural effusion, pneumothorax, or pulmonary opacity following right 7th through 10th rib fractures last month. Hepatobiliary: Stable hepatic steatosis. Gallbladder appears stable and negative. No bile duct enlargement. Pancreas: Stable partial pancreatic atrophy. Spleen: Negative. Adrenals/Urinary Tract: Stable and negative. Decompressed ureters. Symmetric renal contrast excretion on the delayed images. Stomach/Bowel: Decompressed large bowel throughout the abdomen and pelvis with no convincing inflammation. Normal retrocecal appendix on series 2, image 64. No dilated small bowel. Terminal ileum remains within normal limits. Minimal gas and fluid distension of the stomach. Decompressed duodenum. No free air, free fluid, or mesenteric inflammation identified. Vascular/Lymphatic: Mild Calcified aortic atherosclerosis. Major arterial structures in the abdomen and pelvis remain patent. No lymphadenopathy. Portal venous system remains patent. Reproductive: Negative. Other: No pelvis free fluid. Musculoskeletal: Healing right anterior lower rib fractures detailed last month. Chronic lower thoracic and lumbar endplate compression versus Schmorl's nodes unchanged from at least June this year. Stable chronic pubic rami fractures more conspicuous on the right. No new osseous abnormality. IMPRESSION: 1. No acute or inflammatory process identified in the abdomen or pelvis. Appendix remains normal. 2. Healing right 7th through 10th rib fractures detailed last month. 3. Chronic hepatic steatosis.  Mild aortic atherosclerosis. Electronically Signed   By: HGenevie AnnM.D.   On: 03/03/2022 04:48   DG Chest 1 View  Result Date: 03/03/2022 CLINICAL DATA:  Chest pain, nausea EXAM: CHEST  1 VIEW COMPARISON:  02/04/2022 FINDINGS: Slightly improved lung volumes. Cardiac and mediastinal contours are within normal limits given AP technique and low lung  volumes. No focal pulmonary opacity. No pleural effusion or pneumothorax. No acute osseous abnormality. IMPRESSION: No acute cardiopulmonary process. Electronically Signed   By: AMerilyn BabaM.D.   On: 03/03/2022 02:44    Microbiology: Results for orders placed or performed during the hospital encounter of 03/03/22  Urine Culture     Status: Abnormal   Collection Time: 03/03/22  2:43 AM   Specimen: Urine, Clean Catch  Result Value Ref Range Status   Specimen Description   Final    URINE, CLEAN CATCH [facility] [address] [place] [address]   Special Requests   Final    NONE [facility]formed at [facility] [address] [place] [address]   Culture (A)  Final    30,000 COLONIES/mL GROUP B STREP(S.AGALACTIAE)ISOLATED TESTING AGAINST S. AGALACTIAE NOT ROUTINELY PERFORMED DUE TO PREDICTABILITY OF AMP/PEN/VAN SUSCEPTIBILITY. [facility]formed at [facility] [address] [place] [address]   Report Status 03/04/2022 FINAL  Final  Labs: CBC: Recent Labs  Lab 03/21/22 1911 03/22/22 0458 03/23/22 0534 03/24/22 0628 03/25/22 0512  WBC 19.8* 20.5* 16.9* 8.8 6.0  NEUTROABS 17.4* 18.1* 13.7* 6.4  --   HGB 16.1* 13.6 12.6 11.4* 10.2*  HCT 48.8* 40.9 41.0 36.8 32.0*  MCV 93.3 92.3 99.8 98.1 96.1  PLT 444* 397 295 187 790   Basic Metabolic Panel: Recent Labs  Lab 03/21/22 2037 03/22/22 0458 03/23/22 0534 03/23/22 0544 03/24/22 1814 03/25/22 0512  NA 141 138 144  --  142 142  K 3.6 3.2* 3.9  --  3.8 3.1*  CL 102 102 109  --  110 111  CO2 20* 21* 25  --  23 26  GLUCOSE 152* 166* 144*  --  131* 104*  BUN 13 8 <5*  --  <5* 6  CREATININE 1.26* 0.89 0.64  --  0.76 0.56  CALCIUM 9.4 8.7* 8.6*  --  7.8* 7.7*  MG  --   --   --  1.6* 2.0  --    Liver Function Tests: Recent Labs  Lab 03/21/22 2037 03/24/22 1814  AST 49* 36  ALT 28 19  ALKPHOS 110 60  BILITOT 0.7 0.7  PROT 8.2* 5.1*  ALBUMIN 3.4* 2.0*    CBG: Recent Labs  Lab 03/24/22 1531 03/24/22 2011 03/25/22 0010 03/25/22 0410 03/25/22 0745  GLUCAP 98 123* 109* 111* 85    Discharge time spent: greater than 30 minutes.  Signed: Estill Cotta, MD [facility] 03/25/2022

## 2022-03-25 NOTE — Plan of Care (Signed)
Discharge order recd. Pt aware of discharge and states she will call spouse and arrange for transportation home.

## 2022-03-25 NOTE — Progress Notes (Signed)
Pt spouse present to collect pt. IV and tele dc earlier per protocol. Pt tol well. DC instructions given to pt and spouse who voice understanding of medications prescriptions and followup responsibilities. Pt voices no concerns or c/o. No distress noted. Pt travels by wheelchair to main entrance to return home via pov. All belongings accompany pt.

## 2022-03-25 NOTE — Evaluation (Signed)
Physical Therapy Evaluation & discharge Patient Details Name: [name] MRN: [id number] DOB: 1970/07/07 Today's Date: 03/25/2022  History of Present Illness  51 year old female with past medical history diabetes mellitus type 2, hypothyroidism, bipolar disease, PTSD, cyclic vomiting, hypertension, cardiac arrest with MI in 06/2020 which led to anoxic brain injury. CT of the abdomen was positive for transverse as well as descending colon colitis.  Clinical Impression  Pt reluctantly walked with PT. She declined to ambulate outside of room and would only return to bed after gait and not sit up in recliner despite benefits of OOB explained.  Independent with mobility and no PT needs identified.  Will sign off.     Recommendations for follow up therapy are one component of a multi-disciplinary discharge planning process, led by the attending physician.  Recommendations may be updated based on patient status, additional functional criteria and insurance authorization.  Follow Up Recommendations No PT follow up      Assistance Recommended at Discharge PRN  Patient can return home with the following       Equipment Recommendations None recommended by PT  Recommendations for Other Services       Functional Status Assessment Patient has not had a recent decline in their functional status     Precautions / Restrictions Precautions Precautions: None Restrictions Weight Bearing Restrictions: No      Mobility  Bed Mobility Overal bed mobility: Independent                  Transfers Overall transfer level: Independent                      Ambulation/Gait Ambulation/Gait assistance: Independent Gait Distance (Feet): 40 Feet Assistive device: None Gait Pattern/deviations: Step-through pattern       General Gait Details: Ambulated in room without AD. Declined ambulating out of room or sitting in recliner.  Stairs            Wheelchair Mobility     Modified Rankin (Stroke Patients Only)       Balance Overall balance assessment: No apparent balance deficits (not formally assessed)                                           Pertinent Vitals/Pain Pain Assessment Pain Assessment: Faces Faces Pain Scale: Hurts a little bit Pain Location: stomach    Home Living Family/patient expects to be discharged to:: Private residence Living Arrangements: Spouse/significant other;Children Available Help at Discharge: Family Type of Home: House Home Access: Level entry       Home Layout: One level Home Equipment: Conservation officer, nature (2 wheels);Cane - single point;Wheelchair - Sport and exercise psychologist Comments: pt reports full independence    Prior Function Prior Level of Function : Independent/Modified Independent                     Hand Dominance        Extremity/Trunk Assessment   Upper Extremity Assessment Upper Extremity Assessment: Overall WFL for tasks assessed    Lower Extremity Assessment Lower Extremity Assessment: Overall WFL for tasks assessed       Communication   Communication: No difficulties  Cognition Arousal/Alertness: Awake/alert Behavior During Therapy: Flat affect Overall Cognitive Status: Within Functional Limits for tasks assessed  General Comments      Exercises     Assessment/Plan    PT Assessment Patient does not need any further PT services  PT Problem List         PT Treatment Interventions      PT Goals (Current goals can be found in the Care Plan section)  Acute Rehab PT Goals PT Goal Formulation: All assessment and education complete, DC therapy    Frequency       Co-evaluation               AM-PAC PT "6 Clicks" Mobility  Outcome Measure Help needed turning from your back to your side while in a flat bed without using bedrails?: None Help needed moving from lying on your back  to sitting on the side of a flat bed without using bedrails?: None Help needed moving to and from a bed to a chair (including a wheelchair)?: None Help needed standing up from a chair using your arms (e.g., wheelchair or bedside chair)?: None Help needed to walk in hospital room?: None Help needed climbing 3-5 steps with a railing? : None 6 Click Score: 24    End of Session   Activity Tolerance: Patient tolerated treatment well Patient left: in bed Nurse Communication: Mobility status (nurse tech) PT Visit Diagnosis: Difficulty in walking, not elsewhere classified (R26.2)    Time: 0569-7948 PT Time Calculation (min) (ACUTE ONLY): 10 min   Charges:   PT Evaluation $PT Eval Low Complexity: 1 Low          Lydia Guiles., PT Office [phone] Acute Rehab 03/25/2022   Galen Manila 03/25/2022, 9:59 AM

## 2022-03-26 ENCOUNTER — Telehealth: Payer: Self-pay

## 2022-03-26 NOTE — Telephone Encounter (Signed)
Spoke with pt son and notified of Carl Best NP recommendations:  Pt was scheduled for an office visit with Dr. Tarri Glenn on 05/03/2022 at 9:50 AM : Pt son made aware Pt  son verbalized understanding with all questions answered.

## 2022-03-26 NOTE — Telephone Encounter (Signed)
Follow up appt Received: 3 days ago Noralyn Pick, NP  Gillermina Hu, RN [name], patient was admitted to the hospital Friday 12/1. Not sure when she will be discharged but please contact her on Monday and schedule her for a follow up appointment with Dr. Tarri Glenn in 3 to 4 weeks. Thanks.

## 2022-04-23 ENCOUNTER — Other Ambulatory Visit: Payer: Self-pay

## 2022-04-23 ENCOUNTER — Encounter (HOSPITAL_COMMUNITY): Payer: Self-pay

## 2022-04-23 ENCOUNTER — Inpatient Hospital Stay (HOSPITAL_COMMUNITY)
Admission: EM | Admit: 2022-04-23 | Discharge: 2022-04-27 | DRG: 872 | Disposition: A | Discharge status: Home / Self Care | Payer: Medicare Other | Attending: Internal Medicine | Admitting: Internal Medicine

## 2022-04-23 ENCOUNTER — Emergency Department (HOSPITAL_COMMUNITY): Payer: Medicare Other

## 2022-04-23 DIAGNOSIS — E119 Type 2 diabetes mellitus without complications: Secondary | ICD-10-CM | POA: Diagnosis present

## 2022-04-23 DIAGNOSIS — Z1152 Encounter for screening for COVID-19: Secondary | ICD-10-CM

## 2022-04-23 DIAGNOSIS — R339 Retention of urine, unspecified: Secondary | ICD-10-CM | POA: Diagnosis present

## 2022-04-23 DIAGNOSIS — E785 Hyperlipidemia, unspecified: Secondary | ICD-10-CM | POA: Diagnosis present

## 2022-04-23 DIAGNOSIS — Z833 Family history of diabetes mellitus: Secondary | ICD-10-CM

## 2022-04-23 DIAGNOSIS — Z79899 Other long term (current) drug therapy: Secondary | ICD-10-CM

## 2022-04-23 DIAGNOSIS — E8729 Other acidosis: Secondary | ICD-10-CM | POA: Diagnosis not present

## 2022-04-23 DIAGNOSIS — E6609 Other obesity due to excess calories: Secondary | ICD-10-CM | POA: Diagnosis present

## 2022-04-23 DIAGNOSIS — I10 Essential (primary) hypertension: Secondary | ICD-10-CM | POA: Diagnosis present

## 2022-04-23 DIAGNOSIS — E872 Acidosis, unspecified: Secondary | ICD-10-CM

## 2022-04-23 DIAGNOSIS — Z6837 Body mass index (BMI) 37.0-37.9, adult: Secondary | ICD-10-CM | POA: Diagnosis not present

## 2022-04-23 DIAGNOSIS — E66812 Obesity, class 2: Secondary | ICD-10-CM

## 2022-04-23 DIAGNOSIS — A419 Sepsis, unspecified organism: Secondary | ICD-10-CM | POA: Diagnosis present

## 2022-04-23 DIAGNOSIS — G8929 Other chronic pain: Secondary | ICD-10-CM | POA: Diagnosis present

## 2022-04-23 DIAGNOSIS — F419 Anxiety disorder, unspecified: Secondary | ICD-10-CM | POA: Diagnosis present

## 2022-04-23 DIAGNOSIS — N179 Acute kidney failure, unspecified: Secondary | ICD-10-CM | POA: Diagnosis present

## 2022-04-23 DIAGNOSIS — B9689 Other specified bacterial agents as the cause of diseases classified elsewhere: Secondary | ICD-10-CM | POA: Diagnosis present

## 2022-04-23 DIAGNOSIS — R112 Nausea with vomiting, unspecified: Principal | ICD-10-CM

## 2022-04-23 DIAGNOSIS — Z7984 Long term (current) use of oral hypoglycemic drugs: Secondary | ICD-10-CM

## 2022-04-23 DIAGNOSIS — G931 Anoxic brain damage, not elsewhere classified: Secondary | ICD-10-CM | POA: Diagnosis present

## 2022-04-23 DIAGNOSIS — R1084 Generalized abdominal pain: Secondary | ICD-10-CM | POA: Diagnosis present

## 2022-04-23 DIAGNOSIS — M1990 Unspecified osteoarthritis, unspecified site: Secondary | ICD-10-CM | POA: Diagnosis present

## 2022-04-23 DIAGNOSIS — K5289 Other specified noninfective gastroenteritis and colitis: Secondary | ICD-10-CM | POA: Diagnosis present

## 2022-04-23 DIAGNOSIS — R6520 Severe sepsis without septic shock: Secondary | ICD-10-CM | POA: Diagnosis present

## 2022-04-23 DIAGNOSIS — Z713 Dietary counseling and surveillance: Secondary | ICD-10-CM

## 2022-04-23 DIAGNOSIS — K5900 Constipation, unspecified: Secondary | ICD-10-CM | POA: Diagnosis not present

## 2022-04-23 DIAGNOSIS — F4310 Post-traumatic stress disorder, unspecified: Secondary | ICD-10-CM | POA: Diagnosis present

## 2022-04-23 DIAGNOSIS — F319 Bipolar disorder, unspecified: Secondary | ICD-10-CM | POA: Diagnosis present

## 2022-04-23 DIAGNOSIS — R109 Unspecified abdominal pain: Secondary | ICD-10-CM

## 2022-04-23 DIAGNOSIS — Z85828 Personal history of other malignant neoplasm of skin: Secondary | ICD-10-CM | POA: Diagnosis not present

## 2022-04-23 DIAGNOSIS — E039 Hypothyroidism, unspecified: Secondary | ICD-10-CM | POA: Diagnosis present

## 2022-04-23 DIAGNOSIS — E876 Hypokalemia: Secondary | ICD-10-CM | POA: Diagnosis present

## 2022-04-23 DIAGNOSIS — Z22322 Carrier or suspected carrier of Methicillin resistant Staphylococcus aureus: Secondary | ICD-10-CM

## 2022-04-23 DIAGNOSIS — Z79890 Hormone replacement therapy (postmenopausal): Secondary | ICD-10-CM

## 2022-04-23 DIAGNOSIS — Z87820 Personal history of traumatic brain injury: Secondary | ICD-10-CM

## 2022-04-23 DIAGNOSIS — Z79891 Long term (current) use of opiate analgesic: Secondary | ICD-10-CM

## 2022-04-23 LAB — CBC WITH DIFFERENTIAL/PLATELET
Abs Immature Granulocytes: 0.08 10*3/uL — ABNORMAL HIGH (ref 0.00–0.07)
Basophils Absolute: 0.1 10*3/uL (ref 0.0–0.1)
Basophils Relative: 1 %
Eosinophils Absolute: 0.1 10*3/uL (ref 0.0–0.5)
Eosinophils Relative: 1 %
HCT: 48.6 % — ABNORMAL HIGH (ref 36.0–46.0)
Hemoglobin: 15.6 g/dL — ABNORMAL HIGH (ref 12.0–15.0)
Immature Granulocytes: 1 %
Lymphocytes Relative: 13 %
Lymphs Abs: 2.2 10*3/uL (ref 0.7–4.0)
MCH: 29.9 pg (ref 26.0–34.0)
MCHC: 32.1 g/dL (ref 30.0–36.0)
MCV: 93.3 fL (ref 80.0–100.0)
Monocytes Absolute: 0.9 10*3/uL (ref 0.1–1.0)
Monocytes Relative: 5 %
Neutro Abs: 13.4 10*3/uL — ABNORMAL HIGH (ref 1.7–7.7)
Neutrophils Relative %: 79 %
Platelets: 465 10*3/uL — ABNORMAL HIGH (ref 150–400)
RBC: 5.21 MIL/uL — ABNORMAL HIGH (ref 3.87–5.11)
RDW: 13.2 % (ref 11.5–15.5)
WBC: 16.7 10*3/uL — ABNORMAL HIGH (ref 4.0–10.5)
nRBC: 0 % (ref 0.0–0.2)

## 2022-04-23 LAB — COMPREHENSIVE METABOLIC PANEL
ALT: 40 U/L (ref 0–44)
AST: 54 U/L — ABNORMAL HIGH (ref 15–41)
Albumin: 3.6 g/dL (ref 3.5–5.0)
Alkaline Phosphatase: 106 U/L (ref 38–126)
Anion gap: >20 — ABNORMAL HIGH (ref 5–15)
BUN: 7 mg/dL (ref 6–20)
CO2: 19 mmol/L — ABNORMAL LOW (ref 22–32)
Calcium: 9.5 mg/dL (ref 8.9–10.3)
Chloride: 98 mmol/L (ref 98–111)
Creatinine, Ser: 1.13 mg/dL — ABNORMAL HIGH (ref 0.44–1.00)
GFR, Estimated: 59 mL/min — ABNORMAL LOW (ref >60)
Glucose, Bld: 202 mg/dL — ABNORMAL HIGH (ref 70–99)
Potassium: 4 mmol/L (ref 3.5–5.1)
Sodium: 140 mmol/L (ref 135–145)
Total Bilirubin: 0.8 mg/dL (ref 0.3–1.2)
Total Protein: 7.9 g/dL (ref 6.5–8.1)

## 2022-04-23 LAB — BASIC METABOLIC PANEL
Anion gap: 15 (ref 5–15)
BUN: 7 mg/dL (ref 6–20)
CO2: 20 mmol/L — ABNORMAL LOW (ref 22–32)
Calcium: 7.9 mg/dL — ABNORMAL LOW (ref 8.9–10.3)
Chloride: 106 mmol/L (ref 98–111)
Creatinine, Ser: 0.88 mg/dL (ref 0.44–1.00)
GFR, Estimated: >60 mL/min (ref >60)
Glucose, Bld: 133 mg/dL — ABNORMAL HIGH (ref 70–99)
Potassium: 3.8 mmol/L (ref 3.5–5.1)
Sodium: 141 mmol/L (ref 135–145)

## 2022-04-23 LAB — PROCALCITONIN: Procalcitonin: <0.1 ng/mL

## 2022-04-23 LAB — CBG MONITORING, ED
Glucose-Capillary: 185 mg/dL — ABNORMAL HIGH (ref 70–99)
Glucose-Capillary: 210 mg/dL — ABNORMAL HIGH (ref 70–99)
Glucose-Capillary: 138 mg/dL — ABNORMAL HIGH (ref 70–99)
Glucose-Capillary: 172 mg/dL — ABNORMAL HIGH (ref 70–99)

## 2022-04-23 LAB — BETA-HYDROXYBUTYRIC ACID: Beta-Hydroxybutyric Acid: 0.92 mmol/L — ABNORMAL HIGH (ref 0.05–0.27)

## 2022-04-23 LAB — TROPONIN I (HIGH SENSITIVITY)
Troponin I (High Sensitivity): 9 ng/L (ref <18)
Troponin I (High Sensitivity): 20 ng/L — ABNORMAL HIGH (ref <18)

## 2022-04-23 LAB — LIPASE, BLOOD: Lipase: 26 U/L (ref 11–51)

## 2022-04-23 LAB — BLOOD GAS, VENOUS
Acid-base deficit: 2.3 mmol/L — ABNORMAL HIGH (ref 0.0–2.0)
Bicarbonate: 21.4 mmol/L (ref 20.0–28.0)
O2 Saturation: 56.2 %
Patient temperature: 37
pCO2, Ven: 33 mmHg — ABNORMAL LOW (ref 44–60)
pH, Ven: 7.42 (ref 7.25–7.43)
pO2, Ven: 31 mmHg — CL (ref 32–45)

## 2022-04-23 LAB — LACTIC ACID, PLASMA
Lactic Acid, Venous: 8.7 mmol/L (ref 0.5–1.9)
Lactic Acid, Venous: 4.2 mmol/L (ref 0.5–1.9)

## 2022-04-23 MED ORDER — PANTOPRAZOLE SODIUM 40 MG PO TBEC
40.0000 mg | DELAYED_RELEASE_TABLET | Freq: Every day | ORAL | Status: DC
  Administered 2022-04-23 – 2022-04-27 (×5): 40 mg via ORAL
  Filled 2022-04-23 (×5): qty 1

## 2022-04-23 MED ORDER — FLUOXETINE HCL 20 MG PO CAPS
40.0000 mg | ORAL_CAPSULE | Freq: Every day | ORAL | Status: DC
  Administered 2022-04-24 – 2022-04-27 (×4): 40 mg via ORAL
  Filled 2022-04-23 (×4): qty 2

## 2022-04-23 MED ORDER — ATORVASTATIN CALCIUM 10 MG PO TABS
10.0000 mg | ORAL_TABLET | Freq: Every day | ORAL | Status: DC
  Administered 2022-04-23 – 2022-04-27 (×5): 10 mg via ORAL
  Filled 2022-04-23 (×5): qty 1

## 2022-04-23 MED ORDER — LACTATED RINGERS IV SOLN: INTRAVENOUS | Status: DC

## 2022-04-23 MED ORDER — CARVEDILOL 6.25 MG PO TABS
6.2500 mg | ORAL_TABLET | Freq: Two times a day (BID) | ORAL | Status: DC
  Administered 2022-04-23 – 2022-04-24 (×3): 6.25 mg via ORAL
  Filled 2022-04-23 (×3): qty 1

## 2022-04-23 MED ORDER — ONDANSETRON HCL 4 MG/2ML IJ SOLN
4.0000 mg | Freq: Once | INTRAMUSCULAR | Status: AC
  Administered 2022-04-23: 4 mg via INTRAVENOUS
  Filled 2022-04-23: qty 2

## 2022-04-23 MED ORDER — ARIPIPRAZOLE 5 MG PO TABS
5.0000 mg | ORAL_TABLET | Freq: Every day | ORAL | Status: DC
  Administered 2022-04-24 – 2022-04-27 (×4): 5 mg via ORAL
  Filled 2022-04-23 (×4): qty 1

## 2022-04-23 MED ORDER — MORPHINE SULFATE (PF) 4 MG/ML IV SOLN
4.0000 mg | Freq: Once | INTRAVENOUS | Status: AC
  Administered 2022-04-23: 4 mg via INTRAVENOUS
  Filled 2022-04-23: qty 1

## 2022-04-23 MED ORDER — METOCLOPRAMIDE HCL 5 MG/ML IJ SOLN
5.0000 mg | Freq: Four times a day (QID) | INTRAMUSCULAR | Status: DC
  Administered 2022-04-23 – 2022-04-27 (×13): 5 mg via INTRAVENOUS
  Filled 2022-04-23 (×13): qty 2

## 2022-04-23 MED ORDER — METRONIDAZOLE 500 MG/100ML IV SOLN
500.0000 mg | Freq: Two times a day (BID) | INTRAVENOUS | Status: DC
  Administered 2022-04-23: 500 mg via INTRAVENOUS
  Filled 2022-04-23: qty 100

## 2022-04-23 MED ORDER — SODIUM CHLORIDE 0.9 % IV BOLUS
1000.0000 mL | Freq: Once | INTRAVENOUS | Status: AC
  Administered 2022-04-23: 1000 mL via INTRAVENOUS

## 2022-04-23 MED ORDER — TIZANIDINE HCL 4 MG PO TABS
12.0000 mg | ORAL_TABLET | Freq: Three times a day (TID) | ORAL | Status: DC | PRN
  Administered 2022-04-24 – 2022-04-27 (×7): 12 mg via ORAL
  Filled 2022-04-23 (×7): qty 3

## 2022-04-23 MED ORDER — IOHEXOL 300 MG/ML  SOLN
100.0000 mL | Freq: Once | INTRAMUSCULAR | Status: AC | PRN
  Administered 2022-04-23: 100 mL via INTRAVENOUS

## 2022-04-23 MED ORDER — METRONIDAZOLE 500 MG/100ML IV SOLN
500.0000 mg | Freq: Once | INTRAVENOUS | Status: AC
  Administered 2022-04-23: 500 mg via INTRAVENOUS
  Filled 2022-04-23: qty 100

## 2022-04-23 MED ORDER — SODIUM CHLORIDE 0.9 % IV SOLN
2.0000 g | Freq: Three times a day (TID) | INTRAVENOUS | Status: DC
  Administered 2022-04-23 – 2022-04-24 (×2): 2 g via INTRAVENOUS
  Filled 2022-04-23 (×2): qty 12.5

## 2022-04-23 MED ORDER — HYDROMORPHONE HCL 1 MG/ML IJ SOLN
0.5000 mg | INTRAMUSCULAR | Status: DC | PRN
  Administered 2022-04-23: 0.5 mg via INTRAVENOUS
  Filled 2022-04-23: qty 1

## 2022-04-23 MED ORDER — LEVOTHYROXINE SODIUM 50 MCG PO TABS
50.0000 ug | ORAL_TABLET | Freq: Every day | ORAL | Status: DC
  Administered 2022-04-24 – 2022-04-27 (×4): 50 ug via ORAL
  Filled 2022-04-23 (×4): qty 1

## 2022-04-23 MED ORDER — HYDROCODONE-ACETAMINOPHEN 10-325 MG PO TABS
1.0000 | ORAL_TABLET | Freq: Four times a day (QID) | ORAL | Status: DC | PRN
  Administered 2022-04-23 – 2022-04-27 (×4): 1 via ORAL
  Filled 2022-04-23 (×4): qty 1

## 2022-04-23 MED ORDER — INSULIN ASPART 100 UNIT/ML IJ SOLN
0.0000 [IU] | Freq: Three times a day (TID) | INTRAMUSCULAR | Status: DC
  Administered 2022-04-24 – 2022-04-25 (×3): 2 [IU] via SUBCUTANEOUS
  Administered 2022-04-25: 3 [IU] via SUBCUTANEOUS
  Administered 2022-04-26: 2 [IU] via SUBCUTANEOUS
  Filled 2022-04-23: qty 0.15

## 2022-04-23 MED ORDER — VANCOMYCIN HCL 1750 MG/350ML IV SOLN
1750.0000 mg | Freq: Once | INTRAVENOUS | Status: AC
  Administered 2022-04-23: 1750 mg via INTRAVENOUS
  Filled 2022-04-23: qty 350

## 2022-04-23 MED ORDER — INSULIN ASPART 100 UNIT/ML IJ SOLN
0.0000 [IU] | Freq: Every day | INTRAMUSCULAR | Status: DC
  Filled 2022-04-23: qty 0.05

## 2022-04-23 MED ORDER — VANCOMYCIN HCL IN DEXTROSE 1-5 GM/200ML-% IV SOLN: 1000.0000 mg | INTRAVENOUS | Status: DC

## 2022-04-23 MED ORDER — ONDANSETRON HCL 4 MG/2ML IJ SOLN
4.0000 mg | Freq: Four times a day (QID) | INTRAMUSCULAR | Status: DC | PRN
  Administered 2022-04-23: 4 mg via INTRAVENOUS
  Filled 2022-04-23: qty 2

## 2022-04-23 MED ORDER — HYDROMORPHONE HCL 1 MG/ML IJ SOLN
1.0000 mg | INTRAMUSCULAR | Status: DC | PRN
  Administered 2022-04-23 – 2022-04-24 (×6): 1 mg via INTRAVENOUS
  Filled 2022-04-23 (×6): qty 1

## 2022-04-23 MED ORDER — HYDROMORPHONE HCL 1 MG/ML IJ SOLN
0.5000 mg | Freq: Once | INTRAMUSCULAR | Status: AC
  Administered 2022-04-23: 0.5 mg via INTRAVENOUS
  Filled 2022-04-23: qty 1

## 2022-04-23 MED ORDER — ENOXAPARIN SODIUM 40 MG/0.4ML IJ SOSY
40.0000 mg | PREFILLED_SYRINGE | INTRAMUSCULAR | Status: DC
  Administered 2022-04-23 – 2022-04-26 (×4): 40 mg via SUBCUTANEOUS
  Filled 2022-04-23 (×4): qty 0.4

## 2022-04-23 MED ORDER — SODIUM CHLORIDE 0.9 % IV SOLN
2.0000 g | Freq: Once | INTRAVENOUS | Status: AC
  Administered 2022-04-23: 2 g via INTRAVENOUS
  Filled 2022-04-23: qty 12.5

## 2022-04-23 MED ORDER — ONDANSETRON HCL 4 MG PO TABS: 4.0000 mg | ORAL_TABLET | Freq: Four times a day (QID) | ORAL | Status: DC | PRN

## 2022-04-23 MED ORDER — TRAZODONE HCL 50 MG PO TABS
150.0000 mg | ORAL_TABLET | Freq: Every day | ORAL | Status: DC
  Administered 2022-04-23 – 2022-04-26 (×3): 150 mg via ORAL
  Filled 2022-04-23 (×2): qty 1
  Filled 2022-04-23: qty 2
  Filled 2022-04-23 (×2): qty 1

## 2022-04-23 NOTE — Progress Notes (Signed)
Pharmacy Antibiotic Note  [name] is a 52 y.o. female admitted on 04/23/2022 with sepsis.  Pharmacy has been consulted for vancomycin and cefepime dosing.  Plan: Cefepime 2g IV q8h Vancomycin '1750mg'$  IV x 1, then 1g IV q24h for estimated AUC 482 using SCr 1.13, Vd 0.5 Check vancomycin levels at steady state, goal AUC 400-550 Follow up renal function & cultures     Temp (24hrs), Avg:98.9 F (37.2 C), Min:98.6 F (37 C), Max:99.2 F (37.3 C)  Recent Labs  Lab 04/23/22 1043 04/23/22 1044 04/23/22 1411  WBC 16.7*  --   --   CREATININE 1.13*  --   --   LATICACIDVEN  --  8.7* 4.2*    CrCl cannot be calculated (Unknown ideal weight.).    No Known Allergies  Antimicrobials this admission: 1/1 Vancomycin >> 1/1 Cefepime >> 1/1 Flagyl x 1  Dose adjustments this admission:  Microbiology results: 1/1 BCx:  Thank you for allowing pharmacy to be a part of this patient's care.  Peggyann Juba, PharmD, BCPS Pharmacy: [phone] 04/23/2022 3:30 PM

## 2022-04-23 NOTE — Progress Notes (Signed)
A consult was received from an ED physician for cefepime and vancomycin per pharmacy dosing.  The patient's profile has been reviewed for ht/wt/allergies/indication/available labs.    Most recent weight documented on 03/21/22 of 89 kg used for dosing.   A one time order has been placed for cefepime 2 g IV once + vancomycin 1750 mg IV once.    Further antibiotics/pharmacy consults should be ordered by admitting physician if indicated.                       Thank you, Lenis Noon, PharmD 04/23/2022  10:42 AM

## 2022-04-23 NOTE — ED Notes (Signed)
Dr. Francia Greaves bedside.

## 2022-04-23 NOTE — H&P (Signed)
History and Physical    Patient: [name] YWV:[id number] DOB: [birth year] DOA: 04/23/2022 DOS: the patient was seen and examined on 04/23/2022 PCP: Ferd Hibbs, NP  Patient coming from: Home  Chief Complaint:  Chief Complaint  Patient presents with   Emesis   Abdominal Pain   HPI: [name] is a 52 y.o. female with medical history significant of anoxic brain injury/TBI, chronic back pain, hypertension, diabetes.  She has frequent visits to the emergency department with admissions usually related to her abdominal pain.  She presents today with abdominal pain, nausea, vomiting that started last night.  She was unable to take any of her medications since yesterday and unable to tolerate any oral food or liquid.  Emesis is described as stomach contents.  She presented here with a fever.  Initial blood work shows a white count of 16.7 with a lactic acid initially of 8.7 that is decreased to 4.2.  Additionally, her blood sugar is 202 with a bicarb of 19 and a anion gap of greater than 20.  Review of Systems: As mentioned in the history of present illness. All other systems reviewed and are negative. Past Medical History:  Diagnosis Date   Anxiety    Arthritis    "joints ache all over" (10/15/2014)   Barrett's esophagus    Bulging lumbar disc    Chronic lower back pain    DDD (degenerative disc disease), cervical    Depression    Drug-seeking behavior    Headache    "weekly" (10/15/2014)   Hyperlipemia    Hypertension    PTSD (post-traumatic stress disorder)    Skin cancer    "had them cut off my arms; don't know what kind"   Past Surgical History:  Procedure Laterality Date   ABLATION ON ENDOMETRIOSIS  2008   BIOPSY  12/27/2018   Procedure: BIOPSY;  Surgeon: Thornton Park, MD;  Location: [facility]OSCOPY;  Service: Gastroenterology;;   BIOPSY  10/05/2021   Procedure: BIOPSY;  Surgeon: Gatha Mayer, MD;  Location: [facility];  Service: Gastroenterology;;    ESOPHAGOGASTRODUODENOSCOPY (EGD) WITH PROPOFOL N/A 12/27/2018   Procedure: ESOPHAGOGASTRODUODENOSCOPY (EGD) WITH PROPOFOL;  Surgeon: Thornton Park, MD;  Location: [facility];  Service: Gastroenterology;  Laterality: N/A;   ESOPHAGOGASTRODUODENOSCOPY (EGD) WITH PROPOFOL N/A 10/05/2021   Procedure: ESOPHAGOGASTRODUODENOSCOPY (EGD) WITH PROPOFOL;  Surgeon: Gatha Mayer, MD;  Location: [place];  Service: Gastroenterology;  Laterality: N/A;   HEMORRHOID SURGERY  ~ 2002   IR FLUORO GUIDE CV LINE RIGHT  06/14/2020   IR REMOVAL TUN CV CATH W/O FL  06/23/2020   IR US GUIDE VASC ACCESS RIGHT  06/14/2020   ORIF ANKLE FRACTURE Right 03/28/2020   Procedure: OPEN REDUCTION INTERNAL FIXATION (ORIF) RIGHT BIMALLEOLAR ANKLE FRACTURE;  Surgeon: Marchia Bond, MD;  Location: [place];  Service: Orthopedics;  Laterality: Right;   Social History:  reports that she has never smoked. She has never used smokeless tobacco. She reports that she does not currently use alcohol. She reports that she does not use drugs.  No Known Allergies  Family History  Problem Relation Age of Onset   Breast cancer Mother    Diabetes Mother    Breast cancer Maternal Grandmother    Breast cancer Paternal Grandmother    Colon polyps Paternal Grandmother    Colon cancer Neg Hx    Esophageal cancer Neg Hx    Rectal cancer Neg Hx    Stomach cancer Neg Hx  Prior to Admission medications   Medication Sig Start Date End Date Taking? Authorizing Provider  acetaminophen (TYLENOL) 325 MG tablet Take 2 tablets (650 mg total) by mouth every 6 (six) hours as needed for mild pain, fever or headache. Patient taking differently: Take 650 mg by mouth every 6 (six) hours as needed for mild pain. 07/01/20   Angiulli, Lavon Paganini, PA-C  amLODipine (NORVASC) 10 MG tablet Take 1 tablet (10 mg total) by mouth daily. Hold for next 2 days 03/27/22   Rai, Vernelle Emerald, MD  ARIPiprazole (ABILIFY) 5 MG tablet Take 1 tablet (5 mg total)  by mouth daily. 08/01/20   Jamse Arn, MD  atorvastatin (LIPITOR) 10 MG tablet Take 10 mg by mouth daily. 08/26/20   [provider]  carvedilol (COREG) 12.5 MG tablet Take 1 tablet (12.5 mg total) by mouth 2 (two) times daily with a meal. 10/06/21   Shelly Coss, MD  FLUoxetine (PROZAC) 40 MG capsule Take 1 capsule (40 mg total) by mouth daily. 07/01/20   Angiulli, Lavon Paganini, PA-C  HYDROcodone-acetaminophen (NORCO) 10-325 MG tablet Take 1 tablet by mouth every 6 (six) hours as needed for severe pain. 03/25/22   Rai, Vernelle Emerald, MD  levothyroxine (SYNTHROID) 50 MCG tablet Take 50 mcg by mouth daily.    [provider]  lidocaine (XYLOCAINE) 5 % ointment Apply 1 Application topically daily as needed for mild pain. 02/26/22   [provider]  lisinopril (ZESTRIL) 10 MG tablet Take 1 tablet (10 mg total) by mouth daily. HOLD until follow-up with your doctor 03/25/22 05/24/22  Mendel Corning, MD  metFORMIN (GLUCOPHAGE) 500 MG tablet Take 500 mg by mouth 2 (two) times daily with a meal.    [provider]  metoCLOPramide (REGLAN) 5 MG tablet Take 1 tablet (5 mg total) by mouth 3 (three) times daily before meals. 03/25/22   Rai, Vernelle Emerald, MD  multivitamin (RENA-VIT) TABS tablet Take 1 tablet by mouth at bedtime. 08/01/20   Jamse Arn, MD  ondansetron (ZOFRAN-ODT) 8 MG disintegrating tablet Take 1 tablet (8 mg total) by mouth every 8 (eight) hours as needed for nausea or vomiting. 03/25/22   Rai, Ripudeep K, MD  pantoprazole (PROTONIX) 40 MG tablet Take 1 tablet (40 mg total) by mouth daily. 09/21/21 09/21/22  Elodia Florence., MD  prazosin (MINIPRESS) 2 MG capsule Take 1 capsule (2 mg total) by mouth at bedtime. 08/01/20   Jamse Arn, MD  promethazine (PHENERGAN) 25 MG suppository Place 1 suppository (25 mg total) rectally every 6 (six) hours as needed for nausea or vomiting. 12/27/21   Drenda Freeze, MD  tiZANidine (ZANAFLEX) 4 MG tablet Take 3 tablets  (12 mg total) by mouth every 8 (eight) hours as needed for muscle spasms. 03/25/22   Rai, Vernelle Emerald, MD  traZODone (DESYREL) 50 MG tablet Take 3 tablets (150 mg total) by mouth at bedtime. 09/21/21   Elodia Florence., MD    Physical Exam: Vitals:   04/23/22 1055 04/23/22 1120 04/23/22 1320 04/23/22 1409  BP: (!) 151/103 (!) 151/115 (!) 169/91 (!) 161/116  Pulse: (!) 118 (!) 108 (!) 102 (!) 114  Resp: '16 13 18 18  '$ Temp:    99.2 F (37.3 C)  TempSrc:    Oral  SpO2: 95% 98% 97% 97%   General: Middle-age female. Awake and alert and oriented x3. No acute cardiopulmonary distress.  HEENT: Normocephalic atraumatic.  Right and left ears normal in appearance.  Pupils equal, round, reactive to light. Extraocular muscles are intact. Sclerae anicteric and noninjected.  Moist mucosal membranes. No mucosal lesions.  Neck: Neck supple without lymphadenopathy. No carotid bruits. No masses palpated.  Cardiovascular: Regular rate with normal S1-S2 sounds. No murmurs, rubs, gallops auscultated. No JVD.  Respiratory: Good respiratory effort with no wheezes, rales, rhonchi. Lungs clear to auscultation bilaterally.  No accessory muscle use. Abdomen: Abdomen diffusely tender.  No rebound tenderness or guarding. Active bowel sounds. No masses or hepatosplenomegaly  Skin: No rashes, lesions, or ulcerations.  Dry, warm to touch. 2+ dorsalis pedis and radial pulses. Musculoskeletal: No calf or leg pain. All major joints not erythematous nontender.  No upper or lower joint deformation.  Good ROM.  No contractures  Psychiatric: Intact judgment and insight. Pleasant and cooperative. Neurologic: No focal neurological deficits. Strength is 5/5 and symmetric in upper and lower extremities.  Cranial nerves II through XII are grossly intact.  Data Reviewed: Results for orders placed or performed during the hospital encounter of 04/23/22 (from the past 24 hour(s))  POC CBG, ED     Status: Abnormal   Collection Time:  04/23/22 10:11 AM  Result Value Ref Range   Glucose-Capillary 185 (H) 70 - 99 mg/dL  CBG monitoring, ED     Status: Abnormal   Collection Time: 04/23/22 10:31 AM  Result Value Ref Range   Glucose-Capillary 210 (H) 70 - 99 mg/dL  Blood gas, venous (at [facility] and [facility])     Status: Abnormal   Collection Time: 04/23/22 10:40 AM  Result Value Ref Range   pH, Ven 7.42 7.25 - 7.43   pCO2, Ven 33 (L) 44 - 60 mmHg   pO2, Ven 31 (LL) 32 - 45 mmHg   Bicarbonate 21.4 20.0 - 28.0 mmol/L   Acid-base deficit 2.3 (H) 0.0 - 2.0 mmol/L   O2 Saturation 56.2 %   Patient temperature 37.0   CBC with Differential     Status: Abnormal   Collection Time: 04/23/22 10:43 AM  Result Value Ref Range   WBC 16.7 (H) 4.0 - 10.5 K/uL   RBC 5.21 (H) 3.87 - 5.11 MIL/uL   Hemoglobin 15.6 (H) 12.0 - 15.0 g/dL   HCT 48.6 (H) 36.0 - 46.0 %   MCV 93.3 80.0 - 100.0 fL   MCH 29.9 26.0 - 34.0 pg   MCHC 32.1 30.0 - 36.0 g/dL   RDW 13.2 11.5 - 15.5 %   Platelets 465 (H) 150 - 400 K/uL   nRBC 0.0 0.0 - 0.2 %   Neutrophils Relative % 79 %   Neutro Abs 13.4 (H) 1.7 - 7.7 K/uL   Lymphocytes Relative 13 %   Lymphs Abs 2.2 0.7 - 4.0 K/uL   Monocytes Relative 5 %   Monocytes Absolute 0.9 0.1 - 1.0 K/uL   Eosinophils Relative 1 %   Eosinophils Absolute 0.1 0.0 - 0.5 K/uL   Basophils Relative 1 %   Basophils Absolute 0.1 0.0 - 0.1 K/uL   Immature Granulocytes 1 %   Abs Immature Granulocytes 0.08 (H) 0.00 - 0.07 K/uL  Comprehensive metabolic panel     Status: Abnormal   Collection Time: 04/23/22 10:43 AM  Result Value Ref Range   Sodium 140 135 - 145 mmol/L   Potassium 4.0 3.5 - 5.1 mmol/L   Chloride 98 98 - 111 mmol/L   CO2 19 (L) 22 - 32 mmol/L   Glucose, Bld 202 (H) 70 - 99 mg/dL   BUN 7 6 -  20 mg/dL   Creatinine, Ser 1.13 (H) 0.44 - 1.00 mg/dL   Calcium 9.5 8.9 - 10.3 mg/dL   Total Protein 7.9 6.5 - 8.1 g/dL   Albumin 3.6 3.5 - 5.0 g/dL   AST 54 (H) 15 - 41 U/L   ALT 40 0 - 44 U/L   Alkaline Phosphatase 106 38 - 126  U/L   Total Bilirubin 0.8 0.3 - 1.2 mg/dL   GFR, Estimated 59 (L) >60 mL/min   Anion gap >20 (H) 5 - 15  Lipase, blood     Status: None   Collection Time: 04/23/22 10:43 AM  Result Value Ref Range   Lipase 26 11 - 51 U/L  Troponin I (High Sensitivity)     Status: None   Collection Time: 04/23/22 10:43 AM  Result Value Ref Range   Troponin I (High Sensitivity) 9 <18 ng/L  Lactic acid, plasma     Status: Abnormal   Collection Time: 04/23/22 10:44 AM  Result Value Ref Range   Lactic Acid, Venous 8.7 (HH) 0.5 - 1.9 mmol/L  Troponin I (High Sensitivity)     Status: Abnormal   Collection Time: 04/23/22 12:19 PM  Result Value Ref Range   Troponin I (High Sensitivity) 20 (H) <18 ng/L  Lactic acid, plasma     Status: Abnormal   Collection Time: 04/23/22  2:11 PM  Result Value Ref Range   Lactic Acid, Venous 4.2 (HH) 0.5 - 1.9 mmol/L  CBG monitoring, ED     Status: Abnormal   Collection Time: 04/23/22  3:21 PM  Result Value Ref Range   Glucose-Capillary 138 (H) 70 - 99 mg/dL     Assessment and Plan: No notes have been filed under this hospital service. Service: Hospitalist  Principal Problem:   Sepsis ([place]) Active Problems:   High anion gap metabolic acidosis   AKI (acute kidney injury) (HCC)   T2DM (type 2 diabetes mellitus) (HCC)   Hypothyroidism   Abdominal pain, generalized   Lactic acidosis  Sepsis With fever, white count, severe lactic acidosis, AKI this is concerning for sepsis Broad-spectrum antibiotics started Will get procalcitonin Check CBC in the morning Blood cultures and urine culture obtained Generalized abdominal pain with nausea and vomiting May be a component of cyclical vomiting syndrome versus gastroparesis.  May also have some component of withdrawal Continue Reglan and antiemetics Pain control High anion gap metabolic acidosis Uncertain of etiology at this point -could be related to hyperemesis or early DKA. Will repeat BMP and obtain beta  hydroxybutyric acid Lactic acidosis Improving Continue broad-spectrum antibiotics DM type 2 Repeat CBG AKI IV fluid bolus given Recheck SCr in AM.   Advance Care Planning:   Code Status: Full Code confirmed by patient  Consults:   Family Communication: none  Severity of Illness: The appropriate patient status for this patient is INPATIENT. Inpatient status is judged to be reasonable and necessary in order to provide the required intensity of service to ensure the patient's safety. The patient's presenting symptoms, physical exam findings, and initial radiographic and laboratory data in the context of their chronic comorbidities is felt to place them at high risk for further clinical deterioration. Furthermore, it is not anticipated that the patient will be medically stable for discharge from the hospital within 2 midnights of admission.   * I certify that at the point of admission it is my clinical judgment that the patient will require inpatient hospital care spanning beyond 2 midnights from the point of admission due  to high intensity of service, high risk for further deterioration and high frequency of surveillance required.*  Author: Truett Mainland, DO 04/23/2022 3:29 PM  For on call review [email or web address].

## 2022-04-23 NOTE — ED Provider Notes (Signed)
[facility] DEPT Provider Note   CSN: [id number] Arrival date & time: 04/23/22  8546     History  Chief Complaint  Patient presents with   Emesis   Abdominal Pain    [name] is a 52 y.o. female.  52 year old female with prior medical history as detailed below presents for evaluation.  Patient complains of nausea, vomiting, diffuse abdominal pain times approximately 8 to 12 hours.  Patient denies fever.  She denies bloody emesis or bloody stools.  Patient reports she is a diabetic.  She takes metformin.  She also complains of some redness to the right axilla.  She is unsure how long this redness has been present.  The history is provided by the patient and medical records.       Home Medications Prior to Admission medications   Medication Sig Start Date End Date Taking? Authorizing Provider  acetaminophen (TYLENOL) 325 MG tablet Take 2 tablets (650 mg total) by mouth every 6 (six) hours as needed for mild pain, fever or headache. Patient taking differently: Take 650 mg by mouth every 6 (six) hours as needed for mild pain. 07/01/20   Angiulli, Lavon Paganini, PA-C  amLODipine (NORVASC) 10 MG tablet Take 1 tablet (10 mg total) by mouth daily. Hold for next 2 days 03/27/22   Rai, Vernelle Emerald, MD  ARIPiprazole (ABILIFY) 5 MG tablet Take 1 tablet (5 mg total) by mouth daily. 08/01/20   Jamse Arn, MD  atorvastatin (LIPITOR) 10 MG tablet Take 10 mg by mouth daily. 08/26/20   [provider]  carvedilol (COREG) 12.5 MG tablet Take 1 tablet (12.5 mg total) by mouth 2 (two) times daily with a meal. 10/06/21   Shelly Coss, MD  FLUoxetine (PROZAC) 40 MG capsule Take 1 capsule (40 mg total) by mouth daily. 07/01/20   Angiulli, Lavon Paganini, PA-C  HYDROcodone-acetaminophen (NORCO) 10-325 MG tablet Take 1 tablet by mouth every 6 (six) hours as needed for severe pain. 03/25/22   Rai, Vernelle Emerald, MD  levothyroxine (SYNTHROID) 50 MCG tablet Take 50 mcg by  mouth daily.    [provider]  lidocaine (XYLOCAINE) 5 % ointment Apply 1 Application topically daily as needed for mild pain. 02/26/22   [provider]  lisinopril (ZESTRIL) 10 MG tablet Take 1 tablet (10 mg total) by mouth daily. HOLD until follow-up with your doctor 03/25/22 05/24/22  Mendel Corning, MD  metFORMIN (GLUCOPHAGE) 500 MG tablet Take 500 mg by mouth 2 (two) times daily with a meal.    [provider]  metoCLOPramide (REGLAN) 5 MG tablet Take 1 tablet (5 mg total) by mouth 3 (three) times daily before meals. 03/25/22   Rai, Vernelle Emerald, MD  multivitamin (RENA-VIT) TABS tablet Take 1 tablet by mouth at bedtime. 08/01/20   Jamse Arn, MD  ondansetron (ZOFRAN-ODT) 8 MG disintegrating tablet Take 1 tablet (8 mg total) by mouth every 8 (eight) hours as needed for nausea or vomiting. 03/25/22   Rai, Ripudeep K, MD  pantoprazole (PROTONIX) 40 MG tablet Take 1 tablet (40 mg total) by mouth daily. 09/21/21 09/21/22  Elodia Florence., MD  prazosin (MINIPRESS) 2 MG capsule Take 1 capsule (2 mg total) by mouth at bedtime. 08/01/20   Jamse Arn, MD  promethazine (PHENERGAN) 25 MG suppository Place 1 suppository (25 mg total) rectally every 6 (six) hours as needed for nausea or vomiting. 12/27/21   Drenda Freeze, MD  tiZANidine (ZANAFLEX) 4 MG  tablet Take 3 tablets (12 mg total) by mouth every 8 (eight) hours as needed for muscle spasms. 03/25/22   Rai, Vernelle Emerald, MD  traZODone (DESYREL) 50 MG tablet Take 3 tablets (150 mg total) by mouth at bedtime. 09/21/21   Elodia Florence., MD      Allergies    Patient has no known allergies.    Review of Systems   Review of Systems  All other systems reviewed and are negative.   Physical Exam Updated Vital Signs BP (!) 169/91 (BP Location: Left Arm)   Pulse (!) 102   Temp 98.6 F (37 C) (Oral)   Resp 18   LMP 06/03/2018 (Approximate) Comment: neg hcg 05/10/20  SpO2 97%  Physical Exam Vitals and  nursing note reviewed.  Constitutional:      General: She is not in acute distress.    Appearance: Normal appearance. She is well-developed.  HENT:     Head: Normocephalic and atraumatic.  Eyes:     Conjunctiva/sclera: Conjunctivae normal.     Pupils: Pupils are equal, round, and reactive to light.  Cardiovascular:     Rate and Rhythm: Regular rhythm. Tachycardia present.     Heart sounds: Normal heart sounds.  Pulmonary:     Effort: Pulmonary effort is normal. No respiratory distress.     Breath sounds: Normal breath sounds.  Abdominal:     General: There is no distension.     Palpations: Abdomen is soft.     Tenderness: There is generalized abdominal tenderness.  Musculoskeletal:        General: No deformity. Normal range of motion.     Cervical back: Normal range of motion and neck supple.  Skin:    General: Skin is warm and dry.     Comments: Small area of erythema in the right axilla.  See images below.  Minimal tenderness noted to this area.  Neurological:     General: No focal deficit present.     Mental Status: She is alert and oriented to person, place, and time.     ED Results / Procedures / Treatments   Labs (all labs ordered are listed, but only abnormal results are displayed) Labs Reviewed  CBC WITH DIFFERENTIAL/PLATELET - Abnormal; Notable for the following components:      Result Value   WBC 16.7 (*)    RBC 5.21 (*)    Hemoglobin 15.6 (*)    HCT 48.6 (*)    Platelets 465 (*)    Neutro Abs 13.4 (*)    Abs Immature Granulocytes 0.08 (*)    All other components within normal limits  COMPREHENSIVE METABOLIC PANEL - Abnormal; Notable for the following components:   CO2 19 (*)    Glucose, Bld 202 (*)    Creatinine, Ser 1.13 (*)    AST 54 (*)    GFR, Estimated 59 (*)    Anion gap >20 (*)    All other components within normal limits  BLOOD GAS, VENOUS - Abnormal; Notable for the following components:   pCO2, Ven 33 (*)    pO2, Ven 31 (*)    Acid-base  deficit 2.3 (*)    All other components within normal limits  LACTIC ACID, PLASMA - Abnormal; Notable for the following components:   Lactic Acid, Venous 8.7 (*)    All other components within normal limits  CBG MONITORING, ED - Abnormal; Notable for the following components:   Glucose-Capillary 185 (*)    All other components  within normal limits  CBG MONITORING, ED - Abnormal; Notable for the following components:   Glucose-Capillary 210 (*)    All other components within normal limits  TROPONIN I (HIGH SENSITIVITY) - Abnormal; Notable for the following components:   Troponin I (High Sensitivity) 20 (*)    All other components within normal limits  CULTURE, BLOOD (ROUTINE X 2)  CULTURE, BLOOD (ROUTINE X 2)  LIPASE, BLOOD  URINALYSIS, ROUTINE W REFLEX MICROSCOPIC  LACTIC ACID, PLASMA  TROPONIN I (HIGH SENSITIVITY)    EKG EKG Interpretation  Date/Time:  Monday April 23 2022 10:21:47 EST Ventricular Rate:  138 PR Interval:  106 QRS Duration: 85 QT Interval:  305 QTC Calculation: 463 R Axis:   11 Text Interpretation: Sinus tachycardia Atrial premature complex RSR' in V1 or V2, right VCD or RVH Confirmed by Dene Gentry [phone]) on 04/23/2022 10:33:32 AM  Radiology CT ABDOMEN PELVIS W CONTRAST  Result Date: 04/23/2022 CLINICAL DATA:  Abdominal pain, acute nonlocalized. EXAM: CT ABDOMEN AND PELVIS WITH CONTRAST TECHNIQUE: Multidetector CT imaging of the abdomen and pelvis was performed using the standard protocol following bolus administration of intravenous contrast. RADIATION DOSE REDUCTION: This exam was performed according to the departmental dose-optimization program which includes automated exposure control, adjustment of the mA and/or kV according to patient size and/or use of iterative reconstruction technique. CONTRAST:  180m OMNIPAQUE IOHEXOL 300 MG/ML  SOLN COMPARISON:  Multiple priors including CT March 21, 2022. FINDINGS: Lower chest: No acute abnormality.  Hepatobiliary: Diffuse hepatic steatosis. Gallbladder is unremarkable. No biliary ductal dilation. Pancreas: No pancreatic ductal dilation or evidence of acute inflammation. Spleen: No splenomegaly. Adrenals/Urinary Tract: Bilateral adrenal glands appear normal. No hydronephrosis. Kidneys demonstrate symmetric enhancement and excretion of contrast material. Urinary bladder is unremarkable for degree of distension. Stomach/Bowel: Stomach is unremarkable for degree of distension. No pathologic dilation of small or large bowel. The appendix and terminal ileum appear normal. Moderate volume of stool in the rectum with rectal wall thickening and perirectal stranding. Vascular/Lymphatic: Aortic atherosclerosis. No pathologically enlarged abdominal or pelvic lymph nodes. Reproductive: Uterus and bilateral adnexa are unremarkable. Other: No significant abdominopelvic free fluid. No pneumoperitoneum. Musculoskeletal: Similar T11 and L3 superior endplate irregularity associated with a Schmorl's node. No acute osseous abnormality. IMPRESSION: 1. Moderate volume of stool in the rectum with rectal wall thickening and perirectal stranding, suggestive of stercoral colitis. 2. Diffuse hepatic steatosis. 3.  Aortic Atherosclerosis (ICD10-I70.0). Electronically Signed   By: JDahlia BailiffM.D.   On: 04/23/2022 12:36   DG Chest Port 1 View  Result Date: 04/23/2022 CLINICAL DATA:  Fever. EXAM: PORTABLE CHEST 1 VIEW COMPARISON:  03/23/2022 and prior studies FINDINGS: This is a low volume study with mild LEFT basilar atelectasis again noted. Cardiomediastinal silhouette is unchanged. There is no evidence of focal airspace disease, pulmonary edema, suspicious pulmonary nodule/mass, pleural effusion, or pneumothorax. No acute bony abnormalities are identified. IMPRESSION: Low volume study with mild LEFT basilar atelectasis again noted. Electronically Signed   By: JMargarette CanadaM.D.   On: 04/23/2022 11:25    Procedures Procedures     Medications Ordered in ED Medications  vancomycin (VANCOREADY) IVPB 1750 mg/350 mL (1,750 mg Intravenous New Bag/Given 04/23/22 1306)  sodium chloride 0.9 % bolus 1,000 mL (1,000 mLs Intravenous New Bag/Given 04/23/22 1051)  ondansetron (ZOFRAN) injection 4 mg (4 mg Intravenous Given 04/23/22 1046)  morphine (PF) 4 MG/ML injection 4 mg (4 mg Intravenous Given 04/23/22 1046)  sodium chloride 0.9 % bolus 1,000 mL (1,000 mLs Intravenous  New Bag/Given 04/23/22 1047)  metroNIDAZOLE (FLAGYL) IVPB 500 mg (0 mg Intravenous Stopped 04/23/22 1306)  ceFEPIme (MAXIPIME) 2 g in sodium chloride 0.9 % 100 mL IVPB (0 g Intravenous Stopped 04/23/22 1136)  morphine (PF) 4 MG/ML injection 4 mg (4 mg Intravenous Given 04/23/22 1126)  sodium chloride 0.9 % bolus 1,000 mL (1,000 mLs Intravenous New Bag/Given 04/23/22 1244)  ondansetron (ZOFRAN) injection 4 mg (4 mg Intravenous Given 04/23/22 1220)  iohexol (OMNIPAQUE) 300 MG/ML solution 100 mL (100 mLs Intravenous Contrast Given 04/23/22 1217)  HYDROmorphone (DILAUDID) injection 0.5 mg (0.5 mg Intravenous Given 04/23/22 1303)    ED Course/ Medical Decision Making/ A&P                           Medical Decision Making Amount and/or Complexity of Data Reviewed Labs: ordered. Radiology: ordered.  Risk Prescription drug management.    Medical Screen Complete  This patient presented to the ED with complaint of abdominal pain, nausea, vomiting.  This complaint involves an extensive number of treatment options. The initial differential diagnosis includes, but is not limited to, AKI, dehydration, metabolic abnormality, etc.  This presentation is: Acute, Chronic, Self-Limited, Previously Undiagnosed, Uncertain Prognosis, Complicated, Systemic Symptoms, and Threat to Life/Bodily Function  Patient is presenting with complaint of nausea, vomiting, abdominal pain.  Symptoms reportedly began within the last 12 hours.  Patient is actively vomiting on arrival.  Of note, patient  with similar presentation and admission earlier this month.  Patient was diagnosed with acute colitis at that time and associated AKI.  Patient's vitals are notable for tachycardia.  Patient is hypertensive.  White count is 16.  Creatinine is 1.13 with a BUN of 7.  CO2 is 19.  Lactic acid is elevated at 8.7.  CT abdomen pelvis suggests stercoral colitis  With IV fluids and antiemetics and pain medications patient is feeling improved.  Initial presentation was concerning for possible sepsis and broad-spectrum antibiotics were administered on arrival. However, presentation is also not inconsistent with severe opiate withdrawal.  Patient would benefit from admission.  Additional history obtained: External records from outside sources obtained and reviewed including prior ED visits and prior Inpatient records.    Lab Tests:  I ordered and personally interpreted labs.  The pertinent results include: CBC, CMP, lactic acid, lipase, and VBG   Imaging Studies ordered:  I ordered imaging studies including chest x-ray, CT abdomen pelvis I independently visualized and interpreted obtained imaging which showed CT suggestive of stercoral colitis I agree with the radiologist interpretation.   Cardiac Monitoring:  The patient was maintained on a cardiac monitor.  I personally viewed and interpreted the cardiac monitor which showed an underlying rhythm of: NSR   Medicines ordered:  I ordered medication including IVF, narcotics, antibiotics  for abdominal pain, nausea, vomiting Reevaluation of the patient after these medicines showed that the patient: improved   Problem List / ED Course:  Nausea, vomiting, abdominal pain, elevated lactic acid   Reevaluation:  After the interventions noted above, I reevaluated the patient and found that they have: improved   Disposition:  After consideration of the diagnostic results and the patients response to treatment, I feel that the patent  would benefit from admission.   CRITICAL CARE Performed by: Valarie Merino   Total critical care time: 45 minutes  Critical care time was exclusive of separately billable procedures and treating other patients.  Critical care was necessary to treat or prevent imminent  or life-threatening deterioration.  Critical care was time spent personally by me on the following activities: development of treatment plan with patient and/or surrogate as well as nursing, discussions with consultants, evaluation of patient's response to treatment, examination of patient, obtaining history from patient or surrogate, ordering and performing treatments and interventions, ordering and review of laboratory studies, ordering and review of radiographic studies, pulse oximetry and re-evaluation of patient's condition.          Final Clinical Impression(s) / ED Diagnoses Final diagnoses:  Nausea and vomiting, unspecified vomiting type  Abdominal pain, unspecified abdominal location  Lactic acidosis    Rx / DC Orders ED Discharge Orders     None         Valarie Merino, MD 04/23/22 1500

## 2022-04-23 NOTE — ED Notes (Addendum)
Pt HR maintaining from 130-140's. EKG obtained, Daniesls, NP made aware

## 2022-04-23 NOTE — ED Provider Triage Note (Signed)
Emergency Medicine Provider Triage Evaluation Note  [name] , a 52 y.o. female  was evaluated in triage.  Pt complains of nausea, vomiting, right-sided abdominal pain x 6 hours.  Rash to right armpit unsure how long it has been there.  Painful.  No previous abdominal surgeries.  Review of Systems  Per HPI  Physical Exam  BP (!) 154/116   Pulse (!) 149   Temp 98.6 F (37 C) (Oral)   Resp 18   LMP 06/03/2018 (Approximate) Comment: neg hcg 05/10/20  SpO2 100%  Gen:   Awake, no distress. Ill appearing Resp:  Normal effort  MSK:   Moves extremities without difficulty  Other:    Medical Decision Making  Medically screening exam initiated at 10:12 AM.  Appropriate orders placed.  SOLOMIYA PASCALE was informed that the remainder of the evaluation will be completed by another provider, this initial triage assessment does not replace that evaluation, and the importance of remaining in the ED until their evaluation is complete.  Going to room next   Sherrill Raring, [place] 04/23/22 1013

## 2022-04-23 NOTE — ED Notes (Signed)
IV team unable to get another IV.

## 2022-04-23 NOTE — ED Triage Notes (Signed)
Pt c/o N/V starting this morning. Pt states she is type 1 diabetic controlled with metformin. Pt states she feels like her heart is racing. Pt c/o abdominal pain starting this morning. Pt c.o pain/redness to right arm pit. Right arm pit noted to have redness.

## 2022-04-24 DIAGNOSIS — E8729 Other acidosis: Secondary | ICD-10-CM

## 2022-04-24 LAB — BLOOD CULTURE ID PANEL (REFLEXED) - BCID2

## 2022-04-24 LAB — GLUCOSE, CAPILLARY: Glucose-Capillary: 112 mg/dL — ABNORMAL HIGH (ref 70–99)

## 2022-04-24 LAB — BASIC METABOLIC PANEL
Anion gap: 14 (ref 5–15)
BUN: 6 mg/dL (ref 6–20)
CO2: 23 mmol/L (ref 22–32)
Calcium: 8.7 mg/dL — ABNORMAL LOW (ref 8.9–10.3)
Chloride: 106 mmol/L (ref 98–111)
Creatinine, Ser: 0.78 mg/dL (ref 0.44–1.00)
GFR, Estimated: >60 mL/min (ref >60)
Glucose, Bld: 124 mg/dL — ABNORMAL HIGH (ref 70–99)
Potassium: 3.2 mmol/L — ABNORMAL LOW (ref 3.5–5.1)
Sodium: 143 mmol/L (ref 135–145)

## 2022-04-24 LAB — RESP PANEL BY RT-PCR (RSV, FLU A&B, COVID)  RVPGX2
Influenza A by PCR: NEGATIVE
Influenza B by PCR: NEGATIVE
Resp Syncytial Virus by PCR: NEGATIVE
SARS Coronavirus 2 by RT PCR: NEGATIVE

## 2022-04-24 LAB — URINALYSIS, ROUTINE W REFLEX MICROSCOPIC
Bilirubin Urine: NEGATIVE
Glucose, UA: NEGATIVE mg/dL
Hgb urine dipstick: NEGATIVE
Ketones, ur: 20 mg/dL — AB
Nitrite: NEGATIVE
Protein, ur: NEGATIVE mg/dL
Specific Gravity, Urine: 1.026 (ref 1.005–1.030)
pH: 5 (ref 5.0–8.0)

## 2022-04-24 LAB — RAPID URINE DRUG SCREEN, HOSP PERFORMED
Amphetamines: POSITIVE — AB
Barbiturates: NOT DETECTED
Benzodiazepines: NOT DETECTED
Cocaine: NOT DETECTED
Opiates: POSITIVE — AB
Tetrahydrocannabinol: NOT DETECTED

## 2022-04-24 LAB — CBG MONITORING, ED
Glucose-Capillary: 131 mg/dL — ABNORMAL HIGH (ref 70–99)
Glucose-Capillary: 134 mg/dL — ABNORMAL HIGH (ref 70–99)

## 2022-04-24 LAB — LACTIC ACID, PLASMA: Lactic Acid, Venous: 1.5 mmol/L (ref 0.5–1.9)

## 2022-04-24 LAB — PROCALCITONIN: Procalcitonin: <0.1 ng/mL

## 2022-04-24 LAB — MAGNESIUM: Magnesium: 1.6 mg/dL — ABNORMAL LOW (ref 1.7–2.4)

## 2022-04-24 MED ORDER — OXYCODONE-ACETAMINOPHEN 7.5-325 MG PO TABS
1.0000 | ORAL_TABLET | Freq: Four times a day (QID) | ORAL | Status: DC
  Administered 2022-04-24 – 2022-04-27 (×9): 1 via ORAL
  Filled 2022-04-24 (×10): qty 1

## 2022-04-24 MED ORDER — GLYCERIN (LAXATIVE) 2.1 G RE SUPP
1.0000 | Freq: Once | RECTAL | Status: AC
  Administered 2022-04-24: 1 via RECTAL
  Filled 2022-04-24 (×2): qty 1

## 2022-04-24 MED ORDER — SODIUM CHLORIDE 0.9 % IV BOLUS
250.0000 mL | Freq: Once | INTRAVENOUS | Status: AC
  Administered 2022-04-24: 250 mL via INTRAVENOUS

## 2022-04-24 MED ORDER — POTASSIUM CHLORIDE IN NACL 20-0.9 MEQ/L-% IV SOLN
INTRAVENOUS | Status: DC
  Filled 2022-04-24 (×6): qty 1000

## 2022-04-24 MED ORDER — LABETALOL HCL 5 MG/ML IV SOLN
20.0000 mg | INTRAVENOUS | Status: DC | PRN
  Administered 2022-04-24 (×2): 20 mg via INTRAVENOUS
  Filled 2022-04-24 (×2): qty 4

## 2022-04-24 MED ORDER — LIP MEDEX EX OINT
TOPICAL_OINTMENT | CUTANEOUS | Status: DC | PRN
  Administered 2022-04-24: 75 via TOPICAL
  Filled 2022-04-24: qty 7

## 2022-04-24 MED ORDER — MAGNESIUM SULFATE 2 GM/50ML IV SOLN
2.0000 g | Freq: Once | INTRAVENOUS | Status: AC
  Administered 2022-04-24: 2 g via INTRAVENOUS
  Filled 2022-04-24: qty 50

## 2022-04-24 MED ORDER — LISINOPRIL 10 MG PO TABS: 10.0000 mg | ORAL_TABLET | Freq: Every day | ORAL | Status: DC

## 2022-04-24 MED ORDER — LACTULOSE 10 GM/15ML PO SOLN
20.0000 g | Freq: Every day | ORAL | Status: DC
  Administered 2022-04-24 – 2022-04-26 (×3): 20 g via ORAL
  Filled 2022-04-24 (×4): qty 30

## 2022-04-24 MED ORDER — AMLODIPINE BESYLATE 10 MG PO TABS: 10.0000 mg | ORAL_TABLET | Freq: Every day | ORAL | Status: DC

## 2022-04-24 MED ORDER — SODIUM CHLORIDE 0.9 % IV BOLUS
500.0000 mL | Freq: Once | INTRAVENOUS | Status: AC
  Administered 2022-04-24: 500 mL via INTRAVENOUS

## 2022-04-24 MED ORDER — PRAZOSIN HCL 1 MG PO CAPS
2.0000 mg | ORAL_CAPSULE | Freq: Every day | ORAL | Status: DC
  Filled 2022-04-24 (×3): qty 2

## 2022-04-24 NOTE — Progress Notes (Addendum)
PROGRESS NOTE    [name]  QJJ:[id number] DOB: Dec 05, 1970 DOA: 04/23/2022 PCP: Ferd Hibbs, NP      Brief Narrative:   From admission h and p  [name] is a 52 y.o. female with medical history significant of anoxic brain injury/TBI, chronic back pain, hypertension, diabetes.  She has frequent visits to the emergency department with admissions usually related to her abdominal pain.  She presents today with abdominal pain, nausea, vomiting that started last night.  She was unable to take any of her medications since yesterday and unable to tolerate any oral food or liquid.  Emesis is described as stomach contents.  She presented here with a fever.  Initial blood work shows a white count of 16.7 with a lactic acid initially of 8.7 that is decreased to 4.2.  Additionally, her blood sugar is 202 with a bicarb of 19 and a anion gap of greater than 20.   Assessment & Plan:   Principal Problem:   Sepsis ([place]) Active Problems:   High anion gap metabolic acidosis   Bipolar affective disorder (H[facility])   Essential hypertension   AKI (acute kidney injury) (HCC)   T2DM (type 2 diabetes mellitus) ([place])   Hypothyroidism   Class 2 obesity due to excess calories with body mass index (BMI) of 37.0 to 37.9 in adult   Abdominal pain, generalized   Lactic acidosis  # SIRS # staph epidermidis in blood culture Report of fever on presentation though don't see that documented. Did have tachycardia, elevated wbcs. S epidermidis growing in one set of blood cultures, a likely contaminant. Think SIRS more likely consequence of vomiting - will hold on abx for now (stopping vanc/cefepime) - continue fluids - f/u covid/flu/rsf  # Abdominal pain # Nausea and vomiting # Lactic acidosis # Stercoral colitis Frequent ER presentations and hospitalizations for this. CT scan showing constipation with possible stercoral colitis. Likely some component of cyclic vomiting. Lactic acidosis appears  resolving - glycerine suppository and lactulose for constipation - enema if this fails to resolve constipation - trend lactate - continue fluids - anti-emetics - f/u UDS  # T2DM Glucose appropriate this morning - SSI  # AKI Resolved  # HTN Here bp elevated - resume home amlodipine and lisinopril - cont home carvedilol  # Chronic pain - re-start home percocet standing, norco prn  # Bipolar - home prazosin, abilify, trazodone  # Hypothyroid - cont home levothyroxine - f/u tsh  # Hypokalemia Likely from vomiting - replete, f/u Mg    DVT prophylaxis: lovenox Code Status: full Family Communication:  none @ bedside  Level of care: Progressive Status is: Inpatient Remains inpatient appropriate because: need for ongoing IV fluids until lactic acidosis is resolved and tolerating PO    Consultants:  none  Procedures: none  Antimicrobials:  none    Subjective: Reports generalized abd pain, reports emesis nbnb earlier this am  Objective: Vitals:   04/24/22 0312 04/24/22 0410 04/24/22 0630 04/24/22 0729  BP:   (!) 169/98   Pulse: (!) 127 (!) 129 (!) 122   Resp: (!) 24 17 (!) 21   Temp: 99.1 F (37.3 C)   99.2 F (37.3 C)  TempSrc:    Oral  SpO2: 94% 94% 99%     Intake/Output Summary (Last 24 hours) at 04/24/2022 0825 Last data filed at 04/24/2022 0249 Gross per 24 hour  Intake 151.1 ml  Output --  Net 151.1 ml   There were no vitals filed for  this visit.  Examination:  General exam: NAD Respiratory system: Clear to auscultation. Respiratory effort normal. Cardiovascular system: S1 & S2 heard, tachycardia. No JVD, murmurs, rubs, gallops or clicks.   Gastrointestinal system: Abdomen is obese, soft and nontender. No organomegaly or masses felt. Normal bowel sounds heard. Central nervous system: Alert and oriented. No focal neurological deficits. Extremities: Symmetric 5 x 5 power. No edema Skin: No rashes, lesions or ulcers Psychiatry:  calm    Data Reviewed: I have personally reviewed following labs and imaging studies  CBC: Recent Labs  Lab 04/23/22 1043  WBC 16.7*  NEUTROABS 13.4*  HGB 15.6*  HCT 48.6*  MCV 93.3  PLT 213*   Basic Metabolic Panel: Recent Labs  Lab 04/23/22 1043 04/23/22 1522 04/24/22 0512  NA 140 141 143  K 4.0 3.8 3.2*  CL 98 106 106  CO2 19* 20* 23  GLUCOSE 202* 133* 124*  BUN '7 7 6  '$ CREATININE 1.13* 0.88 0.78  CALCIUM 9.5 7.9* 8.7*   GFR: CrCl cannot be calculated (Unknown ideal weight.). Liver Function Tests: Recent Labs  Lab 04/23/22 1043  AST 54*  ALT 40  ALKPHOS 106  BILITOT 0.8  PROT 7.9  ALBUMIN 3.6   Recent Labs  Lab 04/23/22 1043  LIPASE 26   No results for input(s): "AMMONIA" in the last 168 hours. Coagulation Profile: No results for input(s): "INR", "PROTIME" in the last 168 hours. Cardiac Enzymes: No results for input(s): "CKTOTAL", "CKMB", "CKMBINDEX", "TROPONINI" in the last 168 hours. BNP (last 3 results) No results for input(s): "PROBNP" in the last 8760 hours. HbA1C: No results for input(s): "HGBA1C" in the last 72 hours. CBG: Recent Labs  Lab 04/23/22 1011 04/23/22 1031 04/23/22 1521 04/23/22 2231  GLUCAP 185* 210* 138* 172*   Lipid Profile: No results for input(s): "CHOL", "HDL", "LDLCALC", "TRIG", "CHOLHDL", "LDLDIRECT" in the last 72 hours. Thyroid Function Tests: No results for input(s): "TSH", "T4TOTAL", "FREET4", "T3FREE", "THYROIDAB" in the last 72 hours. Anemia Panel: No results for input(s): "VITAMINB12", "FOLATE", "FERRITIN", "TIBC", "IRON", "RETICCTPCT" in the last 72 hours. Urine analysis:    Component Value Date/Time   COLORURINE YELLOW 04/24/2022 0512   APPEARANCEUR CLEAR 04/24/2022 0512   LABSPEC 1.026 04/24/2022 0512   PHURINE 5.0 04/24/2022 0512   GLUCOSEU NEGATIVE 04/24/2022 0512   HGBUR NEGATIVE 04/24/2022 0512   BILIRUBINUR NEGATIVE 04/24/2022 0512   KETONESUR 20 (A) 04/24/2022 0512   PROTEINUR NEGATIVE  04/24/2022 0512   UROBILINOGEN 0.2 01/19/2015 0302   NITRITE NEGATIVE 04/24/2022 0512   LEUKOCYTESUR TRACE (A) 04/24/2022 0512   Sepsis Labs: '@LABRCNTIP'$ (procalcitonin:4,lacticidven:4)  ) Recent Results (from the past 240 hour(s))  Culture, blood (routine x 2)     Status: None (Preliminary result)   Collection Time: 04/23/22 10:47 AM   Specimen: BLOOD RIGHT ARM  Result Value Ref Range Status   Specimen Description   Final    BLOOD RIGHT ARM BOTTLES DRAWN AEROBIC AND ANAEROBIC Performed at [facility], [place] [address], [place], [place] [address]    Special Requests   Final    Blood Culture adequate volume Performed at [facility] [address], [place], [address] [address]    Culture  Setup Time   Final    GRAM POSITIVE COCCI AEROBIC BOTTLE ONLY Organism ID to follow CRITICAL RESULT CALLED TO, READ BACK BY AND VERIFIED WITH: Otila Back, AT 2952 04/24/22 [facility]mark Performed at [facility], [place] [address], [place], [place] [address]  Culture GRAM POSITIVE COCCI  Final   Report Status PENDING  Incomplete  Blood Culture ID Panel (Reflexed)     Status: Abnormal   Collection Time: 04/23/22 10:47 AM  Result Value Ref Range Status   Enterococcus faecalis NOT DETECTED NOT DETECTED Final   Enterococcus Faecium NOT DETECTED NOT DETECTED Final   Listeria monocytogenes NOT DETECTED NOT DETECTED Final   Staphylococcus species DETECTED (A) NOT DETECTED Final    Comment: CRITICAL RESULT CALLED TO, READ BACK BY AND VERIFIED WITH: Christean Grief PHARMD, AT [id number] 04/24/22 D. VANHOOK    Staphylococcus aureus (BCID) NOT DETECTED NOT DETECTED Final   Staphylococcus epidermidis DETECTED (A) NOT DETECTED Final    Comment: Methicillin (oxacillin) resistant coagulase negative staphylococcus. Possible blood culture contaminant (unless isolated from more than one blood culture draw or clinical case suggests pathogenicity). No antibiotic treatment is indicated  for blood  culture contaminants. CRITICAL RESULT CALLED TO, READ BACK BY AND VERIFIED WITH: Christean Grief PHARMD, AT 2952 04/24/22 D. VANHOOK    Staphylococcus lugdunensis NOT DETECTED NOT DETECTED Final   Streptococcus species NOT DETECTED NOT DETECTED Final   Streptococcus agalactiae NOT DETECTED NOT DETECTED Final   Streptococcus pneumoniae NOT DETECTED NOT DETECTED Final   Streptococcus pyogenes NOT DETECTED NOT DETECTED Final   A.calcoaceticus-baumannii NOT DETECTED NOT DETECTED Final   Bacteroides fragilis NOT DETECTED NOT DETECTED Final   Enterobacterales NOT DETECTED NOT DETECTED Final   Enterobacter cloacae complex NOT DETECTED NOT DETECTED Final   Escherichia coli NOT DETECTED NOT DETECTED Final   Klebsiella aerogenes NOT DETECTED NOT DETECTED Final   Klebsiella oxytoca NOT DETECTED NOT DETECTED Final   Klebsiella pneumoniae NOT DETECTED NOT DETECTED Final   Proteus species NOT DETECTED NOT DETECTED Final   Salmonella species NOT DETECTED NOT DETECTED Final   Serratia marcescens NOT DETECTED NOT DETECTED Final   Haemophilus influenzae NOT DETECTED NOT DETECTED Final   Neisseria meningitidis NOT DETECTED NOT DETECTED Final   Pseudomonas aeruginosa NOT DETECTED NOT DETECTED Final   Stenotrophomonas maltophilia NOT DETECTED NOT DETECTED Final   Candida albicans NOT DETECTED NOT DETECTED Final   Candida auris NOT DETECTED NOT DETECTED Final   Candida glabrata NOT DETECTED NOT DETECTED Final   Candida krusei NOT DETECTED NOT DETECTED Final   Candida parapsilosis NOT DETECTED NOT DETECTED Final   Candida tropicalis NOT DETECTED NOT DETECTED Final   Cryptococcus neoformans/gattii NOT DETECTED NOT DETECTED Final   Methicillin resistance mecA/C DETECTED (A) NOT DETECTED Final    Comment: CRITICAL RESULT CALLED TO, READ BACK BY AND VERIFIED WITHOtila Back, AT 8413 04/24/22 Rush [facility]mark Performed at [facility], [address] [place], [place] [address]           Radiology Studies: CT ABDOMEN PELVIS W CONTRAST  Result Date: 04/23/2022 CLINICAL DATA:  Abdominal pain, acute nonlocalized. EXAM: CT ABDOMEN AND PELVIS WITH CONTRAST TECHNIQUE: Multidetector CT imaging of the abdomen and pelvis was performed using the standard protocol following bolus administration of intravenous contrast. RADIATION DOSE REDUCTION: This exam was performed according to the departmental dose-optimization program which includes automated exposure control, adjustment of the mA and/or kV according to patient size and/or use of iterative reconstruction technique. CONTRAST:  163m OMNIPAQUE IOHEXOL 300 MG/ML  SOLN COMPARISON:  Multiple priors including CT March 21, 2022. FINDINGS: Lower chest: No acute abnormality. Hepatobiliary: Diffuse hepatic steatosis. Gallbladder is unremarkable. No biliary ductal dilation. Pancreas: No pancreatic ductal dilation or evidence of acute inflammation. Spleen: No splenomegaly. Adrenals/Urinary  Tract: Bilateral adrenal glands appear normal. No hydronephrosis. Kidneys demonstrate symmetric enhancement and excretion of contrast material. Urinary bladder is unremarkable for degree of distension. Stomach/Bowel: Stomach is unremarkable for degree of distension. No pathologic dilation of small or large bowel. The appendix and terminal ileum appear normal. Moderate volume of stool in the rectum with rectal wall thickening and perirectal stranding. Vascular/Lymphatic: Aortic atherosclerosis. No pathologically enlarged abdominal or pelvic lymph nodes. Reproductive: Uterus and bilateral adnexa are unremarkable. Other: No significant abdominopelvic free fluid. No pneumoperitoneum. Musculoskeletal: Similar T11 and L3 superior endplate irregularity associated with a Schmorl's node. No acute osseous abnormality. IMPRESSION: 1. Moderate volume of stool in the rectum with rectal wall thickening and perirectal stranding, suggestive of stercoral colitis. 2. Diffuse hepatic  steatosis. 3.  Aortic Atherosclerosis (ICD10-I70.0). Electronically Signed   By: Dahlia Bailiff M.D.   On: 04/23/2022 12:36   DG Chest Port 1 View  Result Date: 04/23/2022 CLINICAL DATA:  Fever. EXAM: PORTABLE CHEST 1 VIEW COMPARISON:  03/23/2022 and prior studies FINDINGS: This is a low volume study with mild LEFT basilar atelectasis again noted. Cardiomediastinal silhouette is unchanged. There is no evidence of focal airspace disease, pulmonary edema, suspicious pulmonary nodule/mass, pleural effusion, or pneumothorax. No acute bony abnormalities are identified. IMPRESSION: Low volume study with mild LEFT basilar atelectasis again noted. Electronically Signed   By: Margarette Canada M.D.   On: 04/23/2022 11:25        Scheduled Meds:  ARIPiprazole  5 mg Oral Daily   atorvastatin  10 mg Oral Daily   carvedilol  6.25 mg Oral BID WC   enoxaparin (LOVENOX) injection  40 mg Subcutaneous Q24H   FLUoxetine  40 mg Oral Daily   insulin aspart  0-15 Units Subcutaneous TID WC   insulin aspart  0-5 Units Subcutaneous QHS   levothyroxine  50 mcg Oral Q0600   metoCLOPramide (REGLAN) injection  5 mg Intravenous Q6H   pantoprazole  40 mg Oral Daily   traZODone  150 mg Oral QHS   Continuous Infusions:  ceFEPime (MAXIPIME) IV Stopped (04/24/22 0249)   lactated ringers 100 mL/hr at 04/23/22 2254   metronidazole Stopped (04/23/22 2339)   vancomycin       LOS: 1 day     Desma Maxim, MD [facility]   If 7PM-7AM, please contact night-coverage [email or web address] Password [email or web address] 04/24/2022, 8:25 AM

## 2022-04-24 NOTE — ED Notes (Signed)
Pt continuing to remove monitoring equipment despite prior education about importance of keeping it on.

## 2022-04-24 NOTE — ED Notes (Signed)
ED TO INPATIENT HANDOFF REPORT  ED Nurse Name and Phone #: Danise Edge Name/Age/Gender [name] 52 y.o. female Room/Bed: WA13/WA13  Code Status   Code Status: Full Code  Home/SNF/Other Home Patient oriented to: self, place, and time Is this baseline? Yes   Triage Complete: Triage complete  Chief Complaint Sepsis [facility]) [A41.9]  Triage Note Pt c/o N/V starting this morning. Pt states she is type 1 diabetic controlled with metformin. Pt states she feels like her heart is racing. Pt c/o abdominal pain starting this morning. Pt c.o pain/redness to right arm pit. Right arm pit noted to have redness.    Allergies No Known Allergies  Level of Care/Admitting Diagnosis ED Disposition     ED Disposition  Admit   Condition  --   Comment  Hospital Area: [facility] [place] [[id number]]  Level of Care: Progressive [102]  Admit to Progressive based on following criteria: GI, ENDOCRINE disease patients with GI bleeding, acute liver failure or pancreatitis, stable with diabetic ketoacidosis or thyrotoxicosis (hypothyroid) state.  May admit patient to [facility] or [facility] if equivalent level of care is available:: No  Covid Evaluation: Asymptomatic - no recent exposure (last 10 days) testing not required  Diagnosis: Sepsis [facility]) [[id number]]  Admitting Physician: Truett Mainland [place]  Attending Physician: Truett Mainland [[id number]]  Certification:: I certify this patient will need inpatient services for at least 2 midnights  Estimated Length of Stay: 3          B Medical/Surgery History Past Medical History:  Diagnosis Date   Anxiety    Arthritis    "joints ache all over" (10/15/2014)   Barrett's esophagus    Bulging lumbar disc    Chronic lower back pain    DDD (degenerative disc disease), cervical    Depression    Drug-seeking behavior    Headache    "weekly" (10/15/2014)   Hyperlipemia    Hypertension    PTSD (post-traumatic stress disorder)    Skin  cancer    "had them cut off my arms; don't know what kind"   Past Surgical History:  Procedure Laterality Date   ABLATION ON ENDOMETRIOSIS  2008   BIOPSY  12/27/2018   Procedure: BIOPSY;  Surgeon: Thornton Park, MD;  Location: [facility] ENDOSCOPY;  Service: Gastroenterology;;   BIOPSY  10/05/2021   Procedure: BIOPSY;  Surgeon: Gatha Mayer, MD;  Location: [facility] END[facility];  Service: Gastroenterology;;   ESOPHAGOGASTRODUODENOSCOPY (EGD) WITH PROPOFOL N/A 12/27/2018   Procedure: ESOPHAGOGASTRODUODENOSCOPY (EGD) WITH PROPOFOL;  Surgeon: Thornton Park, MD;  Location: [facility] ENDOSCOPY;  Service: Gastroenterology;  Laterality: N/A;   ESOPHAGOGASTRODUODENOSCOPY (EGD) WITH PROPOFOL N/A 10/05/2021   Procedure: ESOPHAGOGASTRODUODENOSCOPY (EGD) WITH PROPOFOL;  Surgeon: Gatha Mayer, MD;  Location: [place];  Service: Gastroenterology;  Laterality: N/A;   HEMORRHOID SURGERY  ~ 2002   IR FLUORO GUIDE CV LINE RIGHT  06/14/2020   IR REMOVAL TUN CV CATH W/O FL  06/23/2020   IR US GUIDE VASC ACCESS RIGHT  06/14/2020   ORIF ANKLE FRACTURE Right 03/28/2020   Procedure: OPEN REDUCTION INTERNAL FIXATION (ORIF) RIGHT BIMALLEOLAR ANKLE FRACTURE;  Surgeon: Marchia Bond, MD;  Location: [place];  Service: Orthopedics;  Laterality: Right;     A IV Location/Drains/Wounds Patient Lines/Drains/Airways Status     Active Line/Drains/Airways     Name Placement date Placement time Site Days   Peripheral IV 04/23/22 20 G 1.88" Left Antecubital 04/23/22  1046  Antecubital  1  Peripheral IV 04/24/22 20 G 1.88" Anterior;Left;Upper Arm 04/24/22  1256  Arm  less than 1            Intake/Output Last 24 hours  Intake/Output Summary (Last 24 hours) at 04/24/2022 1349 Last data filed at 04/24/2022 0249 Gross per 24 hour  Intake 151.1 ml  Output --  Net 151.1 ml    Labs/Imaging Results for orders placed or performed during the hospital encounter of 04/23/22 (from the past 48 hour(s))  POC CBG, ED      Status: Abnormal   Collection Time: 04/23/22 10:11 AM  Result Value Ref Range   Glucose-Capillary 185 (H) 70 - 99 mg/dL    Comment: Glucose reference range applies only to samples taken after fasting for at least 8 hours.  CBG monitoring, ED     Status: Abnormal   Collection Time: 04/23/22 10:31 AM  Result Value Ref Range   Glucose-Capillary 210 (H) 70 - 99 mg/dL    Comment: Glucose reference range applies only to samples taken after fasting for at least 8 hours.  Blood gas, venous (at [facility])     Status: Abnormal   Collection Time: 04/23/22 10:40 AM  Result Value Ref Range   pH, Ven 7.42 7.25 - 7.43   pCO2, Ven 33 (L) 44 - 60 mmHg    Comment: A   pO2, Ven 31 (LL) 32 - 45 mmHg    Comment: CRITICAL RESULT CALLED TO, READ BACK BY AND VERIFIED WITH: MARSHALL, C RN ON 04/23/22 AT 1057 BY GOLSON    Bicarbonate 21.4 20.0 - 28.0 mmol/L   Acid-base deficit 2.3 (H) 0.0 - 2.0 mmol/L   O2 Saturation 56.2 %   Patient temperature 37.0     Comment: Performed at [facility], [place] [address], [place], [place] [address]  CBC with Differential     Status: Abnormal   Collection Time: 04/23/22 10:43 AM  Result Value Ref Range   WBC 16.7 (H) 4.0 - 10.5 K/uL   RBC 5.21 (H) 3.87 - 5.11 MIL/uL   Hemoglobin 15.6 (H) 12.0 - 15.0 g/dL   HCT 48.6 (H) 36.0 - 46.0 %   MCV 93.3 80.0 - 100.0 fL   MCH 29.9 26.0 - 34.0 pg   MCHC 32.1 30.0 - 36.0 g/dL   RDW 13.2 11.5 - 15.5 %   Platelets 465 (H) 150 - 400 K/uL   nRBC 0.0 0.0 - 0.2 %   Neutrophils Relative % 79 %   Neutro Abs 13.4 (H) 1.7 - 7.7 K/uL   Lymphocytes Relative 13 %   Lymphs Abs 2.2 0.7 - 4.0 K/uL   Monocytes Relative 5 %   Monocytes Absolute 0.9 0.1 - 1.0 K/uL   Eosinophils Relative 1 %   Eosinophils Absolute 0.1 0.0 - 0.5 K/uL   Basophils Relative 1 %   Basophils Absolute 0.1 0.0 - 0.1 K/uL   Immature Granulocytes 1 %   Abs Immature Granulocytes 0.08 (H) 0.00 - 0.07 K/uL    Comment: Performed at [facility], [place] [address], [place], [place] [address]  Comprehensive metabolic panel     Status: Abnormal   Collection Time: 04/23/22 10:43 AM  Result Value Ref Range   Sodium 140 135 - 145 mmol/L   Potassium 4.0 3.5 - 5.1 mmol/L   Chloride 98 98 - 111 mmol/L   CO2 19 (L) 22 - 32 mmol/L   Glucose, Bld 202 (H) 70 - 99 mg/dL  Comment: Glucose reference range applies only to samples taken after fasting for at least 8 hours.   BUN 7 6 - 20 mg/dL   Creatinine, Ser 1.13 (H) 0.44 - 1.00 mg/dL   Calcium 9.5 8.9 - 10.3 mg/dL   Total Protein 7.9 6.5 - 8.1 g/dL   Albumin 3.6 3.5 - 5.0 g/dL   AST 54 (H) 15 - 41 U/L   ALT 40 0 - 44 U/L    Comment: RESULTS CONFIRMED BY MANUAL DILUTION   Alkaline Phosphatase 106 38 - 126 U/L   Total Bilirubin 0.8 0.3 - 1.2 mg/dL   GFR, Estimated 59 (L) >60 mL/min    Comment: (NOTE) Calculated using the CKD-EPI Creatinine Equation (2021)    Anion gap >20 (H) 5 - 15    Comment: Performed at [facility], [place] [address], [place], [place] [address]  Lipase, blood     Status: None   Collection Time: 04/23/22 10:43 AM  Result Value Ref Range   Lipase 26 11 - 51 U/L    Comment: Performed at [facility], [place] [address] [place], [place] [address]  Troponin I (High Sensitivity)     Status: None   Collection Time: 04/23/22 10:43 AM  Result Value Ref Range   Troponin I (High Sensitivity) 9 <18 ng/L    Comment: (NOTE) Elevated high sensitivity troponin I (hsTnI) values and significant  changes across serial measurements may suggest ACS but many other  chronic and acute conditions are known to elevate hsTnI results.  Refer to the "Links" section for chest pain algorithms and additional  guidance. Performed at [facility] [place] [address], [place], [place] [address]   Lactic acid, plasma     Status: Abnormal   Collection Time: 04/23/22 10:44 AM  Result Value Ref Range   Lactic Acid, Venous 8.7 (HH) 0.5 -  1.9 mmol/L    Comment: CRITICAL RESULT CALLED TO, READ BACK BY AND VERIFIED WITH MONTEE, B RN AT 1155 04/23/22 BY TIBBITTS, K Performed at [facility], [place] [address], [place], [place] [address]   Culture, blood (routine x 2)     Status: None (Preliminary result)   Collection Time: 04/23/22 10:47 AM   Specimen: BLOOD RIGHT ARM  Result Value Ref Range   Specimen Description      BLOOD RIGHT ARM BOTTLES DRAWN AEROBIC AND ANAEROBIC Performed at [facility], [place] [address] [place], [place] [address]    Special Requests      Blood Culture adequate volume Performed at [place] [address], [place], [place] [address]    Culture  Setup Time      GRAM POSITIVE COCCI AEROBIC BOTTLE ONLY Organism ID to follow CRITICAL RESULT CALLED TO, READ BACK BY AND VERIFIED WITH: Otila Back, AT 1540 04/24/22 Rush Landmark Performed at [facility], [place] [address], [place], [place] [address]    Culture GRAM POSITIVE COCCI    Report Status PENDING   Culture, blood (routine x 2)     Status: None (Preliminary result)   Collection Time: 04/23/22 10:47 AM   Specimen: BLOOD LEFT ARM  Result Value Ref Range   Specimen Description      BLOOD LEFT ARM BOTTLES DRAWN AEROBIC AND ANAEROBIC Performed at [facility], [place] [address], [place], [place] [address]    Special Requests      Blood Culture adequate volume Performed at [facility], [place] [address] [place], [place] [address]    Culture      NO GROWTH < 24 HOURS Performed at [place] [address] [place], [place] [address]    Report Status PENDING   Blood Culture ID Panel (Reflexed)     Status: Abnormal   Collection Time: 04/23/22 10:47 AM  Result Value Ref Range   Enterococcus faecalis NOT DETECTED NOT DETECTED   Enterococcus Faecium NOT DETECTED NOT DETECTED   Listeria monocytogenes NOT DETECTED NOT DETECTED   Staphylococcus  species DETECTED (A) NOT DETECTED    Comment: CRITICAL RESULT CALLED TO, READ BACK BY AND VERIFIED WITH: Christean Grief PHARMD, AT 1771 04/24/22 D. VANHOOK    Staphylococcus aureus (BCID) NOT DETECTED NOT DETECTED   Staphylococcus epidermidis DETECTED (A) NOT DETECTED    Comment: Methicillin (oxacillin) resistant coagulase negative staphylococcus. Possible blood culture contaminant (unless isolated from more than one blood culture draw or clinical case suggests pathogenicity). No antibiotic treatment is indicated for blood  culture contaminants. CRITICAL RESULT CALLED TO, READ BACK BY AND VERIFIED WITH: Christean Grief PHARMD, AT 1657 04/24/22 D. VANHOOK    Staphylococcus lugdunensis NOT DETECTED NOT DETECTED   Streptococcus species NOT DETECTED NOT DETECTED   Streptococcus agalactiae NOT DETECTED NOT DETECTED   Streptococcus pneumoniae NOT DETECTED NOT DETECTED   Streptococcus pyogenes NOT DETECTED NOT DETECTED   A.calcoaceticus-baumannii NOT DETECTED NOT DETECTED   Bacteroides fragilis NOT DETECTED NOT DETECTED   Enterobacterales NOT DETECTED NOT DETECTED   Enterobacter cloacae complex NOT DETECTED NOT DETECTED   Escherichia coli NOT DETECTED NOT DETECTED   Klebsiella aerogenes NOT DETECTED NOT DETECTED   Klebsiella oxytoca NOT DETECTED NOT DETECTED   Klebsiella pneumoniae NOT DETECTED NOT DETECTED   Proteus species NOT DETECTED NOT DETECTED   Salmonella species NOT DETECTED NOT DETECTED   Serratia marcescens NOT DETECTED NOT DETECTED   Haemophilus influenzae NOT DETECTED NOT DETECTED   Neisseria meningitidis NOT DETECTED NOT DETECTED   Pseudomonas aeruginosa NOT DETECTED NOT DETECTED   Stenotrophomonas maltophilia NOT DETECTED NOT DETECTED   Candida albicans NOT DETECTED NOT DETECTED   Candida auris NOT DETECTED NOT DETECTED   Candida glabrata NOT DETECTED NOT DETECTED   Candida krusei NOT DETECTED NOT DETECTED   Candida parapsilosis NOT DETECTED NOT DETECTED   Candida tropicalis NOT DETECTED  NOT DETECTED   Cryptococcus neoformans/gattii NOT DETECTED NOT DETECTED   Methicillin resistance mecA/C DETECTED (A) NOT DETECTED    Comment: CRITICAL RESULT CALLED TO, READ BACK BY AND VERIFIED WITHOtila Back, AT 9038 04/24/22 Rush Landmark Performed at [facility], [address], [place], [place] [address]   Troponin I (High Sensitivity)     Status: Abnormal   Collection Time: 04/23/22 12:19 PM  Result Value Ref Range   Troponin I (High Sensitivity) 20 (H) <18 ng/L    Comment: (NOTE) Elevated high sensitivity troponin I (hsTnI) values and significant  changes across serial measurements may suggest ACS but many other  chronic and acute conditions are known to elevate hsTnI results.  Refer to the "Links" section for chest pain algorithms and additional  guidance. Performed at [facility], [place] [address] [place], [place] [address]   Lactic acid, plasma     Status: Abnormal   Collection Time: 04/23/22  2:11 PM  Result Value Ref Range   Lactic Acid, Venous 4.2 (HH) 0.5 - 1.9 mmol/L    Comment: CRITICAL RESULT CALLED TO, READ BACK BY AND  [place] 1308 04/23/22 BY TIBBITTS,K Performed at [facility], [place] [address] [place], [place] [address]   CBG monitoring, ED     Status: Abnormal   Collection Time: 04/23/22  3:21 PM  Result Value Ref Range   Glucose-Capillary 138 (H) 70 - 99 mg/dL    Comment: Glucose reference range applies only to samples taken after fasting for at least 8 hours.  Basic metabolic panel     Status: Abnormal   Collection Time: 04/23/22  3:22 PM  Result Value Ref Range   Sodium 141 135 - 145 mmol/L   Potassium 3.8 3.5 - 5.1 mmol/L   Chloride 106 98 - 111 mmol/L   CO2 20 (L) 22 - 32 mmol/L   Glucose, Bld 133 (H) 70 - 99 mg/dL    Comment: Glucose reference range applies only to samples taken after fasting for at least 8 hours.   BUN 7 6 - 20 mg/dL   Creatinine, Ser 0.88 0.44 - 1.00 mg/dL    Calcium 7.9 (L) 8.9 - 10.3 mg/dL   GFR, Estimated >60 >60 mL/min    Comment: (NOTE) Calculated using the CKD-EPI Creatinine Equation (2021)    Anion gap 15 5 - 15    Comment: Performed at [facility], [place] [address], [place], [place] [address]  Procalcitonin - Baseline     Status: None   Collection Time: 04/23/22  3:22 PM  Result Value Ref Range   Procalcitonin <0.10 ng/mL    Comment:        Interpretation: PCT (Procalcitonin) <= 0.5 ng/mL: Systemic infection (sepsis) is not likely. Local bacterial infection is possible. (NOTE)       Sepsis PCT Algorithm           Lower Respiratory Tract                                      Infection PCT Algorithm    ----------------------------     ----------------------------         PCT < 0.25 ng/mL                PCT < 0.10 ng/mL          Strongly encourage             Strongly discourage   discontinuation of antibiotics    initiation of antibiotics    ----------------------------     -----------------------------       PCT 0.25 - 0.50 ng/mL            PCT 0.10 - 0.25 ng/mL               OR       >80% decrease in PCT            Discourage initiation of                                            antibiotics      Encourage discontinuation           of antibiotics    ----------------------------     -----------------------------         PCT >= 0.50 ng/mL              PCT  0.26 - 0.50 ng/mL               AND        <80% decrease in PCT             Encourage initiation of                                             antibiotics       Encourage continuation           of antibiotics    ----------------------------     -----------------------------        PCT >= 0.50 ng/mL                  PCT > 0.50 ng/mL               AND         increase in PCT                  Strongly encourage                                      initiation of antibiotics    Strongly encourage escalation           of antibiotics                                      -----------------------------                                           PCT <= 0.25 ng/mL                                                 OR                                        > 80% decrease in PCT                                      Discontinue / Do not initiate                                             antibiotics  Performed at [place] [address], [place], [place] [address]   Beta-hydroxybutyric acid     Status: Abnormal   Collection Time: 04/23/22  3:30 PM  Result Value Ref Range   Beta-Hydroxybutyric Acid 0.92 (H) 0.05 - 0.27 mmol/L    Comment: Performed at [facility], [place] [address] [place], [place] [address]  CBG monitoring, ED     Status: Abnormal   Collection Time: 04/23/22 10:31 PM  Result Value Ref Range   Glucose-Capillary 172 (H) 70 - 99 mg/dL    Comment: Glucose reference range applies only to samples taken after fasting for at least 8 hours.  Urinalysis, Routine w reflex microscopic     Status: Abnormal   Collection Time: 04/24/22  5:12 AM  Result Value Ref Range   Color, Urine YELLOW YELLOW   APPearance CLEAR CLEAR   Specific Gravity, Urine 1.026 1.005 - 1.030   pH 5.0 5.0 - 8.0   Glucose, UA NEGATIVE NEGATIVE mg/dL   Hgb urine dipstick NEGATIVE NEGATIVE   Bilirubin Urine NEGATIVE NEGATIVE   Ketones, ur 20 (A) NEGATIVE mg/dL   Protein, ur NEGATIVE NEGATIVE mg/dL   Nitrite NEGATIVE NEGATIVE   Leukocytes,Ua TRACE (A) NEGATIVE   RBC / HPF 0-5 0 - 5 RBC/hpf   WBC, UA 0-5 0 - 5 WBC/hpf   Bacteria, UA RARE (A) NONE SEEN   Squamous Epithelial / LPF 0-5 0 - 5 /HPF   Mucus PRESENT     Comment: Performed at [facility], [place] [address] [place], [place] [address]  Procalcitonin     Status: None   Collection Time: 04/24/22  5:12 AM  Result Value Ref Range   Procalcitonin <0.10 ng/mL    Comment:        Interpretation: PCT (Procalcitonin) <= 0.5 ng/mL: Systemic infection (sepsis)  is not likely. Local bacterial infection is possible. (NOTE)       Sepsis PCT Algorithm           Lower Respiratory Tract                                      Infection PCT Algorithm    ----------------------------     ----------------------------         PCT < 0.25 ng/mL                PCT < 0.10 ng/mL          Strongly encourage             Strongly discourage   discontinuation of antibiotics    initiation of antibiotics    ----------------------------     -----------------------------       PCT 0.25 - 0.50 ng/mL            PCT 0.10 - 0.25 ng/mL               OR       >80% decrease in PCT            Discourage initiation of                                            antibiotics      Encourage discontinuation           of antibiotics    ----------------------------     -----------------------------         PCT >= 0.50 ng/mL              PCT 0.26 - 0.50 ng/mL               AND        <80% decrease in PCT             Encourage  initiation of                                             antibiotics       Encourage continuation           of antibiotics    ----------------------------     -----------------------------        PCT >= 0.50 ng/mL                  PCT > 0.50 ng/mL               AND         increase in PCT                  Strongly encourage                                      initiation of antibiotics    Strongly encourage escalation           of antibiotics                                     -----------------------------                                           PCT <= 0.25 ng/mL                                                 OR                                        > 80% decrease in PCT                                      Discontinue / Do not initiate                                             antibiotics  Performed at [place] [address] [place], [place] [address]   Basic metabolic panel     Status: Abnormal   Collection Time:  04/24/22  5:12 AM  Result Value Ref Range   Sodium 143 135 - 145 mmol/L   Potassium 3.2 (L) 3.5 - 5.1 mmol/L   Chloride 106 98 - 111 mmol/L   CO2 23 22 - 32 mmol/L   Glucose, Bld 124 (H) 70 - 99 mg/dL    Comment: Glucose reference range applies only to samples taken after fasting for at least 8 hours.   BUN 6 6 - 20 mg/dL   Creatinine, Ser 0.78 0.44 - 1.00 mg/dL   Calcium 8.7 (L) 8.9 - 10.3  mg/dL   GFR, Estimated >60 >60 mL/min    Comment: (NOTE) Calculated using the CKD-EPI Creatinine Equation (2021)    Anion gap 14 5 - 15    Comment: Performed at [facility], [place] [address] [place], [place] [address]  Magnesium     Status: Abnormal   Collection Time: 04/24/22  5:12 AM  Result Value Ref Range   Magnesium 1.6 (L) 1.7 - 2.4 mg/dL    Comment: Performed at [facility], [place] [address], [place], [place] [address]  Rapid urine drug screen (hospital performed)     Status: Abnormal   Collection Time: 04/24/22  5:12 AM  Result Value Ref Range   Opiates POSITIVE (A) NONE DETECTED   Cocaine NONE DETECTED NONE DETECTED   Benzodiazepines NONE DETECTED NONE DETECTED   Amphetamines POSITIVE (A) NONE DETECTED   Tetrahydrocannabinol NONE DETECTED NONE DETECTED   Barbiturates NONE DETECTED NONE DETECTED    Comment: (NOTE) DRUG SCREEN FOR MEDICAL PURPOSES ONLY.  IF CONFIRMATION IS NEEDED FOR ANY PURPOSE, NOTIFY LAB WITHIN 5 DAYS.  LOWEST DETECTABLE LIMITS FOR URINE DRUG SCREEN Drug Class                     Cutoff (ng/mL) Amphetamine and metabolites    1000 Barbiturate and metabolites    200 Benzodiazepine                 200 Opiates and metabolites        300 Cocaine and metabolites        300 THC                            50 Performed at [facility], [place] [address] [place], [place] [address]   CBG monitoring, ED     Status: Abnormal   Collection Time: 04/24/22  8:35 AM  Result Value Ref Range   Glucose-Capillary 131 (H)  70 - 99 mg/dL    Comment: Glucose reference range applies only to samples taken after fasting for at least 8 hours.  Resp panel by RT-PCR (RSV, Flu A&B, Covid) Anterior Nasal Swab     Status: None   Collection Time: 04/24/22  8:40 AM   Specimen: Anterior Nasal Swab  Result Value Ref Range   SARS Coronavirus 2 by RT PCR NEGATIVE NEGATIVE    Comment: (NOTE) SARS-CoV-2 target nucleic acids are NOT DETECTED.  The SARS-CoV-2 RNA is generally detectable in upper respiratory specimens during the acute phase of infection. The lowest concentration of SARS-CoV-2 viral copies this assay can detect is 138 copies/mL. A negative result does not preclude SARS-Cov-2 infection and should not be used as the sole basis for treatment or other patient management decisions. A negative result may occur with  improper specimen collection/handling, submission of specimen other than nasopharyngeal swab, presence of viral mutation(s) within the areas targeted by this assay, and inadequate number of viral copies(<138 copies/mL). A negative result must be combined with clinical observations, patient history, and epidemiological information. The expected result is Negative.  Fact Sheet for Patients:  EntrepreneurPulse.com.au  Fact Sheet for Healthcare Providers:  IncredibleEmployment.be  This test is no t yet approved or cleared by the [place] FDA and  has been authorized for detection and/or diagnosis of SARS-CoV-2 by FDA under an Emergency Use Authorization (EUA). This EUA will remain  in effect (meaning this test can be used) for the duration of  the COVID-19 declaration under Section 564(b)(1) of the Act, 21 U.S.C.section 360bbb-3(b)(1), unless the authorization is terminated  or revoked sooner.       Influenza A by PCR NEGATIVE NEGATIVE   Influenza B by PCR NEGATIVE NEGATIVE    Comment: (NOTE) The Xpert Xpress SARS-CoV-2/FLU/RSV plus assay is intended as an  aid in the diagnosis of influenza from Nasopharyngeal swab specimens and should not be used as a sole basis for treatment. Nasal washings and aspirates are unacceptable for Xpert Xpress SARS-CoV-2/FLU/RSV testing.  Fact Sheet for Patients: EntrepreneurPulse.com.au  Fact Sheet for Healthcare Providers: IncredibleEmployment.be  This test is not yet approved or cleared by the [place] FDA and has been authorized for detection and/or diagnosis of SARS-CoV-2 by FDA under an Emergency Use Authorization (EUA). This EUA will remain in effect (meaning this test can be used) for the duration of the COVID-19 declaration under Section 564(b)(1) of the Act, 21 U.S.C. section 360bbb-3(b)(1), unless the authorization is terminated or revoked.     Resp Syncytial Virus by PCR NEGATIVE NEGATIVE    Comment: (NOTE) Fact Sheet for Patients: EntrepreneurPulse.com.au  Fact Sheet for Healthcare Providers: IncredibleEmployment.be  This test is not yet approved or cleared by the [place] FDA and has been authorized for detection and/or diagnosis of SARS-CoV-2 by FDA under an Emergency Use Authorization (EUA). This EUA will remain in effect (meaning this test can be used) for the duration of the COVID-19 declaration under Section 564(b)(1) of the Act, 21 U.S.C. section 360bbb-3(b)(1), unless the authorization is terminated or revoked.  Performed at [facility], [place] [address], [place], [place] [address]   CBG monitoring, ED     Status: Abnormal   Collection Time: 04/24/22 11:43 AM  Result Value Ref Range   Glucose-Capillary 134 (H) 70 - 99 mg/dL    Comment: Glucose reference range applies only to samples taken after fasting for at least 8 hours.   CT ABDOMEN PELVIS W CONTRAST  Result Date: 04/23/2022 CLINICAL DATA:  Abdominal pain, acute nonlocalized. EXAM: CT ABDOMEN AND PELVIS WITH CONTRAST  TECHNIQUE: Multidetector CT imaging of the abdomen and pelvis was performed using the standard protocol following bolus administration of intravenous contrast. RADIATION DOSE REDUCTION: This exam was performed according to the departmental dose-optimization program which includes automated exposure control, adjustment of the mA and/or kV according to patient size and/or use of iterative reconstruction technique. CONTRAST:  173m OMNIPAQUE IOHEXOL 300 MG/ML  SOLN COMPARISON:  Multiple priors including CT March 21, 2022. FINDINGS: Lower chest: No acute abnormality. Hepatobiliary: Diffuse hepatic steatosis. Gallbladder is unremarkable. No biliary ductal dilation. Pancreas: No pancreatic ductal dilation or evidence of acute inflammation. Spleen: No splenomegaly. Adrenals/Urinary Tract: Bilateral adrenal glands appear normal. No hydronephrosis. Kidneys demonstrate symmetric enhancement and excretion of contrast material. Urinary bladder is unremarkable for degree of distension. Stomach/Bowel: Stomach is unremarkable for degree of distension. No pathologic dilation of small or large bowel. The appendix and terminal ileum appear normal. Moderate volume of stool in the rectum with rectal wall thickening and perirectal stranding. Vascular/Lymphatic: Aortic atherosclerosis. No pathologically enlarged abdominal or pelvic lymph nodes. Reproductive: Uterus and bilateral adnexa are unremarkable. Other: No significant abdominopelvic free fluid. No pneumoperitoneum. Musculoskeletal: Similar T11 and L3 superior endplate irregularity associated with a Schmorl's node. No acute osseous abnormality. IMPRESSION: 1. Moderate volume of stool in the rectum with rectal wall thickening and perirectal stranding, suggestive of stercoral colitis. 2. Diffuse hepatic steatosis. 3.  Aortic Atherosclerosis (ICD10-I70.0). Electronically Signed   By:  Dahlia Bailiff M.D.   On: 04/23/2022 12:36   DG Chest Port 1 View  Result Date:  04/23/2022 CLINICAL DATA:  Fever. EXAM: PORTABLE CHEST 1 VIEW COMPARISON:  03/23/2022 and prior studies FINDINGS: This is a low volume study with mild LEFT basilar atelectasis again noted. Cardiomediastinal silhouette is unchanged. There is no evidence of focal airspace disease, pulmonary edema, suspicious pulmonary nodule/mass, pleural effusion, or pneumothorax. No acute bony abnormalities are identified. IMPRESSION: Low volume study with mild LEFT basilar atelectasis again noted. Electronically Signed   By: Margarette Canada M.D.   On: 04/23/2022 11:25    Pending Labs Unresulted Labs (From admission, onward)     Start     Ordered   04/25/22 0355  Basic metabolic panel  Daily,   R      04/24/22 0837   04/25/22 0500  CBC  Daily,   R      04/24/22 0837   04/24/22 0838  Lactic acid, plasma  Once,   R        04/24/22 0837   04/24/22 0838  TSH  Add-on,   AD        04/24/22 0837   04/23/22 1750  MRSA Next Gen by PCR, Nasal  Once,   R        04/23/22 1749   04/23/22 1548  Urine Culture  (Urine Culture)  Once,   R       Question:  Indication  Answer:  Sepsis   04/23/22 1547            Vitals/Pain Today's Vitals   04/24/22 0839 04/24/22 0945 04/24/22 1218 04/24/22 1230  BP: (!) 185/113   (!) 163/102  Pulse: (!) 109   (!) 113  Resp: 20   18  Temp:   98.8 F (37.1 C)   TempSrc:   Oral   SpO2: 99%   93%  PainSc:  6       Isolation Precautions No active isolations  Medications Medications  carvedilol (COREG) tablet 6.25 mg (6.25 mg Oral Given 04/24/22 0854)  atorvastatin (LIPITOR) tablet 10 mg (10 mg Oral Given 04/24/22 1118)  ARIPiprazole (ABILIFY) tablet 5 mg (5 mg Oral Given 04/24/22 1118)  FLUoxetine (PROZAC) capsule 40 mg (40 mg Oral Given 04/24/22 1118)  levothyroxine (SYNTHROID) tablet 50 mcg (50 mcg Oral Given 04/24/22 0512)  pantoprazole (PROTONIX) EC tablet 40 mg (40 mg Oral Given 04/24/22 1118)  metoCLOPramide (REGLAN) injection 5 mg (5 mg Intravenous Given 04/24/22 1122)  enoxaparin  (LOVENOX) injection 40 mg (40 mg Subcutaneous Given 04/23/22 2239)  ondansetron (ZOFRAN) tablet 4 mg ( Oral See Alternative 04/23/22 1931)    Or  ondansetron (ZOFRAN) injection 4 mg (4 mg Intravenous Given 04/23/22 1931)  insulin aspart (novoLOG) injection 0-15 Units (2 Units Subcutaneous Given 04/24/22 1300)  insulin aspart (novoLOG) injection 0-5 Units ( Subcutaneous Not Given 04/23/22 2231)  HYDROcodone-acetaminophen (NORCO) 10-325 MG per tablet 1 tablet (1 tablet Oral Given 04/24/22 0105)  HYDROmorphone (DILAUDID) injection 1 mg (1 mg Intravenous Given 04/24/22 0855)  tiZANidine (ZANAFLEX) tablet 12 mg (has no administration in time range)  traZODone (DESYREL) tablet 150 mg (150 mg Oral Given 04/23/22 2219)  amLODipine (NORVASC) tablet 10 mg (has no administration in time range)  oxyCODONE-acetaminophen (PERCOCET) 7.5-325 MG per tablet 1 tablet (1 tablet Oral Given 04/24/22 1118)  prazosin (MINIPRESS) capsule 2 mg (has no administration in time range)  lactulose (CHRONULAC) 10 GM/15ML solution 20 g (20 g Oral Given 04/24/22 1121)  0.9 % NaCl with KCl 20 mEq/ L  infusion ( Intravenous New Bag/Given 04/24/22 1344)  sodium chloride 0.9 % bolus 1,000 mL (0 mLs Intravenous Stopped 04/23/22 1508)  ondansetron (ZOFRAN) injection 4 mg (4 mg Intravenous Given 04/23/22 1046)  morphine (PF) 4 MG/ML injection 4 mg (4 mg Intravenous Given 04/23/22 1046)  sodium chloride 0.9 % bolus 1,000 mL (0 mLs Intravenous Stopped 04/23/22 1508)  metroNIDAZOLE (FLAGYL) IVPB 500 mg (0 mg Intravenous Stopped 04/23/22 1306)  ceFEPIme (MAXIPIME) 2 g in sodium chloride 0.9 % 100 mL IVPB (0 g Intravenous Stopped 04/23/22 1136)  vancomycin (VANCOREADY) IVPB 1750 mg/350 mL (0 mg Intravenous Stopped 04/23/22 1716)  morphine (PF) 4 MG/ML injection 4 mg (4 mg Intravenous Given 04/23/22 1126)  sodium chloride 0.9 % bolus 1,000 mL (0 mLs Intravenous Stopped 04/23/22 1526)  ondansetron (ZOFRAN) injection 4 mg (4 mg Intravenous Given 04/23/22 1220)  iohexol (OMNIPAQUE)  300 MG/ML solution 100 mL (100 mLs Intravenous Contrast Given 04/23/22 1217)  HYDROmorphone (DILAUDID) injection 0.5 mg (0.5 mg Intravenous Given 04/23/22 1303)  Glycerin (Adult) 2.1 g suppository 1 suppository (1 suppository Rectal Given 04/24/22 1220)    Mobility walks Low fall risk   Focused Assessments     R Recommendations: See Admitting Provider Note  Report given to:   Additional Notes:

## 2022-04-24 NOTE — ED Notes (Signed)
Pt found to have urinated in bed. Pt linen changed and placed in fresh gown. Purewick applied.

## 2022-04-24 NOTE — Progress Notes (Signed)
PHARMACY - PHYSICIAN COMMUNICATION CRITICAL VALUE ALERT - BLOOD CULTURE IDENTIFICATION (BCID)  [name] is an 52 y.o. female who presented to [facility] on 04/23/2022 with a chief complaint of emesis, abd pain started on cefepime and vanc for sepsis concern  Assessment:   1 bottle from blood cultures growing staph epi - presumed contaminant  Name of physician (or Provider) Contacted: [place]  Current antibiotics: vanc/cefepime  Changes to prescribed antibiotics recommended:  No changes needed  Results for orders placed or performed during the hospital encounter of 04/23/22  Blood Culture ID Panel (Reflexed) (Collected: 04/23/2022 10:47 AM)  Result Value Ref Range   Enterococcus faecalis NOT DETECTED NOT DETECTED   Enterococcus Faecium NOT DETECTED NOT DETECTED   Listeria monocytogenes NOT DETECTED NOT DETECTED   Staphylococcus species DETECTED (A) NOT DETECTED   Staphylococcus aureus (BCID) NOT DETECTED NOT DETECTED   Staphylococcus epidermidis DETECTED (A) NOT DETECTED   Staphylococcus lugdunensis NOT DETECTED NOT DETECTED   Streptococcus species NOT DETECTED NOT DETECTED   Streptococcus agalactiae NOT DETECTED NOT DETECTED   Streptococcus pneumoniae NOT DETECTED NOT DETECTED   Streptococcus pyogenes NOT DETECTED NOT DETECTED   A.calcoaceticus-baumannii NOT DETECTED NOT DETECTED   Bacteroides fragilis NOT DETECTED NOT DETECTED   Enterobacterales NOT DETECTED NOT DETECTED   Enterobacter cloacae complex NOT DETECTED NOT DETECTED   Escherichia coli NOT DETECTED NOT DETECTED   Klebsiella aerogenes NOT DETECTED NOT DETECTED   Klebsiella oxytoca NOT DETECTED NOT DETECTED   Klebsiella pneumoniae NOT DETECTED NOT DETECTED   Proteus species NOT DETECTED NOT DETECTED   Salmonella species NOT DETECTED NOT DETECTED   Serratia marcescens NOT DETECTED NOT DETECTED   Haemophilus influenzae NOT DETECTED NOT DETECTED   Neisseria meningitidis NOT DETECTED NOT DETECTED   Pseudomonas  aeruginosa NOT DETECTED NOT DETECTED   Stenotrophomonas maltophilia NOT DETECTED NOT DETECTED   Candida albicans NOT DETECTED NOT DETECTED   Candida auris NOT DETECTED NOT DETECTED   Candida glabrata NOT DETECTED NOT DETECTED   Candida krusei NOT DETECTED NOT DETECTED   Candida parapsilosis NOT DETECTED NOT DETECTED   Candida tropicalis NOT DETECTED NOT DETECTED   Cryptococcus neoformans/gattii NOT DETECTED NOT DETECTED   Methicillin resistance mecA/C DETECTED (A) NOT DETECTED    Kara Mead 04/24/2022  8:27 AM

## 2022-04-24 NOTE — ED Notes (Signed)
Pt removed all monitor equipment and was educated on importance of keeping it on.

## 2022-04-24 NOTE — ED Notes (Signed)
Walked into pt room to see pt with all clothes off and all monitor cords removed. Pt sitting on edge of bed.

## 2022-04-25 DIAGNOSIS — N179 Acute kidney failure, unspecified: Secondary | ICD-10-CM | POA: Diagnosis not present

## 2022-04-25 DIAGNOSIS — A419 Sepsis, unspecified organism: Secondary | ICD-10-CM | POA: Diagnosis not present

## 2022-04-25 DIAGNOSIS — R6520 Severe sepsis without septic shock: Secondary | ICD-10-CM | POA: Diagnosis not present

## 2022-04-25 LAB — CBC
HCT: 35.1 % — ABNORMAL LOW (ref 36.0–46.0)
Hemoglobin: 11.1 g/dL — ABNORMAL LOW (ref 12.0–15.0)
MCH: 30.7 pg (ref 26.0–34.0)
MCHC: 31.6 g/dL (ref 30.0–36.0)
MCV: 97.2 fL (ref 80.0–100.0)
Platelets: 215 10*3/uL (ref 150–400)
RBC: 3.61 MIL/uL — ABNORMAL LOW (ref 3.87–5.11)
RDW: 14 % (ref 11.5–15.5)
WBC: 10.3 10*3/uL (ref 4.0–10.5)
nRBC: 0 % (ref 0.0–0.2)

## 2022-04-25 LAB — TSH: TSH: 0.438 u[IU]/mL (ref 0.350–4.500)

## 2022-04-25 LAB — BASIC METABOLIC PANEL
Anion gap: 9 (ref 5–15)
BUN: <5 mg/dL — ABNORMAL LOW (ref 6–20)
CO2: 22 mmol/L (ref 22–32)
Calcium: 7.7 mg/dL — ABNORMAL LOW (ref 8.9–10.3)
Chloride: 110 mmol/L (ref 98–111)
Creatinine, Ser: 0.62 mg/dL (ref 0.44–1.00)
GFR, Estimated: >60 mL/min (ref >60)
Glucose, Bld: 101 mg/dL — ABNORMAL HIGH (ref 70–99)
Potassium: 4.4 mmol/L (ref 3.5–5.1)
Sodium: 141 mmol/L (ref 135–145)

## 2022-04-25 LAB — GLUCOSE, CAPILLARY
Glucose-Capillary: 114 mg/dL — ABNORMAL HIGH (ref 70–99)
Glucose-Capillary: 179 mg/dL — ABNORMAL HIGH (ref 70–99)
Glucose-Capillary: 102 mg/dL — ABNORMAL HIGH (ref 70–99)
Glucose-Capillary: 122 mg/dL — ABNORMAL HIGH (ref 70–99)
Glucose-Capillary: 130 mg/dL — ABNORMAL HIGH (ref 70–99)

## 2022-04-25 LAB — URINE CULTURE: Culture: NO GROWTH

## 2022-04-25 MED ORDER — BISACODYL 10 MG RE SUPP: 10.0000 mg | Freq: Every day | RECTAL | Status: DC | PRN

## 2022-04-25 MED ORDER — TAMSULOSIN HCL 0.4 MG PO CAPS
0.4000 mg | ORAL_CAPSULE | Freq: Every day | ORAL | Status: DC
  Administered 2022-04-25 – 2022-04-27 (×3): 0.4 mg via ORAL
  Filled 2022-04-25 (×3): qty 1

## 2022-04-25 MED ORDER — SODIUM CHLORIDE 0.9 % IV BOLUS
500.0000 mL | Freq: Once | INTRAVENOUS | Status: AC
  Administered 2022-04-25: 500 mL via INTRAVENOUS

## 2022-04-25 MED ORDER — POLYETHYLENE GLYCOL 3350 17 G PO PACK
17.0000 g | PACK | Freq: Two times a day (BID) | ORAL | Status: DC
  Administered 2022-04-25 – 2022-04-26 (×3): 17 g via ORAL
  Filled 2022-04-25 (×4): qty 1

## 2022-04-25 MED ORDER — SODIUM CHLORIDE 0.9 % IV SOLN
2.0000 g | Freq: Three times a day (TID) | INTRAVENOUS | Status: DC
  Administered 2022-04-25 – 2022-04-26 (×3): 2 g via INTRAVENOUS
  Filled 2022-04-25 (×3): qty 12.5

## 2022-04-25 NOTE — Progress Notes (Signed)
PHARMACY - PHYSICIAN COMMUNICATION CRITICAL VALUE ALERT - BLOOD CULTURE IDENTIFICATION (BCID)  [name] is an 52 y.o. female who presented to [facility] on 04/23/2022 with a chief complaint of emesis, abd pain.  She was initially started on cefepime and vanc for sepsis concern, but then abx d/c given suspected blood culture contaminant.    Assessment:  Updated Blood culture:  One of 4 bottles with MRSE (resulted 1/2) and yet to be identified gram negative rods (resulted 1/3).  Per Micro lab, the GNR is not one that is the BCID panel since it would have shown up on the BCID run yesterday.   Afebrile, WBC WNL, PCT < 0.1, Hypotensive (85/64)  Name of physician (or Provider) Contacted: Dr Lupita Leash  Current antibiotics: None  Changes to prescribed antibiotics recommended:  Recommendations accepted by provider - Start Cefepime 2g IV q8h  Results for orders placed or performed during the hospital encounter of 04/23/22  Blood Culture ID Panel (Reflexed) (Collected: 04/23/2022 10:47 AM)  Result Value Ref Range   Enterococcus faecalis NOT DETECTED NOT DETECTED   Enterococcus Faecium NOT DETECTED NOT DETECTED   Listeria monocytogenes NOT DETECTED NOT DETECTED   Staphylococcus species DETECTED (A) NOT DETECTED   Staphylococcus aureus (BCID) NOT DETECTED NOT DETECTED   Staphylococcus epidermidis DETECTED (A) NOT DETECTED   Staphylococcus lugdunensis NOT DETECTED NOT DETECTED   Streptococcus species NOT DETECTED NOT DETECTED   Streptococcus agalactiae NOT DETECTED NOT DETECTED   Streptococcus pneumoniae NOT DETECTED NOT DETECTED   Streptococcus pyogenes NOT DETECTED NOT DETECTED   A.calcoaceticus-baumannii NOT DETECTED NOT DETECTED   Bacteroides fragilis NOT DETECTED NOT DETECTED   Enterobacterales NOT DETECTED NOT DETECTED   Enterobacter cloacae complex NOT DETECTED NOT DETECTED   Escherichia coli NOT DETECTED NOT DETECTED   Klebsiella aerogenes NOT DETECTED NOT DETECTED   Klebsiella oxytoca NOT  DETECTED NOT DETECTED   Klebsiella pneumoniae NOT DETECTED NOT DETECTED   Proteus species NOT DETECTED NOT DETECTED   Salmonella species NOT DETECTED NOT DETECTED   Serratia marcescens NOT DETECTED NOT DETECTED   Haemophilus influenzae NOT DETECTED NOT DETECTED   Neisseria meningitidis NOT DETECTED NOT DETECTED   Pseudomonas aeruginosa NOT DETECTED NOT DETECTED   Stenotrophomonas maltophilia NOT DETECTED NOT DETECTED   Candida albicans NOT DETECTED NOT DETECTED   Candida auris NOT DETECTED NOT DETECTED   Candida glabrata NOT DETECTED NOT DETECTED   Candida krusei NOT DETECTED NOT DETECTED   Candida parapsilosis NOT DETECTED NOT DETECTED   Candida tropicalis NOT DETECTED NOT DETECTED   Cryptococcus neoformans/gattii NOT DETECTED NOT DETECTED   Methicillin resistance mecA/C DETECTED (A) NOT DETECTED    Gretta Arab PharmD, BCPS [facility] main pharmacy [phone] 04/25/2022 12:14 PM

## 2022-04-25 NOTE — Progress Notes (Signed)
PROGRESS NOTE [name]  YKD:[id number] DOB: 1970/10/20 DOA: 04/23/2022 PCP: Ferd Hibbs, NP   Brief Narrative/Hospital Course: 52 y.o.f w significant hx of anoxic brain injury/TBI, chronic back pain, hypertension, diabetes w/ frequent visits to the ED with admissions usually related to her abdominal pain presented with abdominal pain,nausea,vomiting, unable to take any of her medications and unable to tolerate any oral food or liquid. She had fever blood work showed a white count of 16.7 with a lactic acid initially of 8.7 that is decreased to 4.2, blood sugar is 202 with a bicarb of 19 and a anion gap of greater than 20.  Chest x-ray low volume with mild left basilar atelectasis.  CT abdomen pelvis with contrast> moderate volume stool in the rectum with rectal wall thickening and perirectal stranding suggestive of stercoral colitis and diffuse hepatic steatosis. Other labs showed normal procalcitonin mild hypokalemia Lactic acidosis and leucocytosis resolved.  Subjective: Seen and examined.  She feels slightly better today having urine retention mild abdominal discomfort present but no nausea and vomiting.  Last bowel movement 2 days ago.   Assessment and Plan: Principal Problem:   Sepsis ([place]) Active Problems:   High anion gap metabolic acidosis   Bipolar affective disorder (HCC)   Essential hypertension   AKI (acute kidney injury) (HCC)   T2DM (type 2 diabetes mellitus) ([place])   Hypothyroidism   Class 2 obesity due to excess calories with body mass index (BMI) of 37.0 to 37.9 in adult   Abdominal pain, generalized   Lactic acidosis   SIRS: w/ tachycardia leukocytosis lactic acidosis.  No source of infection identified. Staff epidermidis in blood culture likely contamination: No documented fever in the ED and since admission.  Source likely in the setting of patient's GI symptoms.  Off antibiotics, doing overall well.  Monitor  Nausea vomiting abdominal pain Stercoral  colitis: Patient had frequent ED visits and hospitalization, CT scan with constipation and stercoral colitis.  Likely some component of cyclical vomiting.  Continue glycerin suppository, lactulose, enema if needed.  Continue antiemetics, IV fluids supportive care.  Patient diet and advance she feels improved overall.  Lactic acidosis in the setting of nausea vomiting: Resolved.   UDS positive for amphetamine and opiates Type 2 diabetes mellitus stable Cbg Recent Labs  Lab 04/24/22 1143 04/24/22 1701 04/24/22 2309 04/25/22 0816 04/25/22 1146  GLUCAP 134* 112* 114* 179* 102*    AKI resolved  Hypertension: Overnight BP was soft in 50s to 80s and will discontinue coreg/amlodipine-although blood pressure was uncontrolled yesterday afternoon. Chronic pain continue home Percocet Bipolar disorder on prazosin Abilify trazodone Hypothyroidism continue Synthroid Hypokalemia: Resolved Urine retention insert Foley catheter, add flomax. Class I Obesity:Patient's Body mass index is 33.2 kg/m. : Will benefit with PCP follow-up, weight loss  healthy lifestyle and outpatient sleep evaluation.  DVT prophylaxis: enoxaparin (LOVENOX) injection 40 mg Start: 04/23/22 2200 Code Status:   Code Status: Full Code Family Communication: plan of care discussed with patient at bedside. Patient status is: inpatient because of constipation nausea vomiting and source Level of care: Progressive   Dispo: The patient is from: home            Anticipated disposition: home in 24 hrs Objective: Vitals last 24 hrs: Vitals:   04/25/22 0316 04/25/22 0412 04/25/22 0725 04/25/22 0817  BP: 100/69 115/69  95/60  Pulse: 88 83  66  Resp:  17  18  Temp:  98.4 F (36.9 C)  98.1 F (36.7 C)  TempSrc:  Oral  Oral  SpO2:  94%  99%  Weight:   89.1 kg   Height:   5' 4.5" (1.638 m)    Weight change:   Physical Examination: General exam: alert awake, older than stated age HEENT:Oral mucosa moist, Ear/Nose WNL  grossly Respiratory system: bilaterally diminished BS, no use of accessory muscle Cardiovascular system: S1 & S2 +, No JVD. Gastrointestinal system: Abdomen soft,NT,ND, BS+ Nervous System:Alert, awake, moving extremities. Extremities: LE edema neg,distal peripheral pulses palpable.  Skin: No rashes,no icterus. MSK: Normal muscle bulk,tone, power  Medications reviewed:  Scheduled Meds:  ARIPiprazole  5 mg Oral Daily   atorvastatin  10 mg Oral Daily   enoxaparin (LOVENOX) injection  40 mg Subcutaneous Q24H   FLUoxetine  40 mg Oral Daily   insulin aspart  0-15 Units Subcutaneous TID WC   insulin aspart  0-5 Units Subcutaneous QHS   lactulose  20 g Oral Daily   levothyroxine  50 mcg Oral Q0600   metoCLOPramide (REGLAN) injection  5 mg Intravenous Q6H   oxyCODONE-acetaminophen  1 tablet Oral QID   pantoprazole  40 mg Oral Daily   prazosin  2 mg Oral QHS   tamsulosin  0.4 mg Oral QPC breakfast   traZODone  150 mg Oral QHS   Continuous Infusions:  0.9 % NaCl with KCl 20 mEq / L 100 mL/hr at 04/24/22 2356      Diet Order             Diet clear liquid Room service appropriate? Yes; Fluid consistency: Thin  Diet effective now                   Intake/Output Summary (Last 24 hours) at 04/25/2022 0905 Last data filed at 04/25/2022 0600 Gross per 24 hour  Intake 2196.36 ml  Output --  Net 2196.36 ml   Net IO Since Admission: 2,347.46 mL [04/25/22 0905]  Wt Readings from Last 3 Encounters:  04/25/22 89.1 kg  03/21/22 89.4 kg  03/03/22 89.4 kg     Unresulted Labs (From admission, onward)     Start     Ordered   04/25/22 2878  Basic metabolic panel  Daily,   R      04/24/22 0837   04/25/22 0500  CBC  Daily,   R      04/24/22 0837   04/23/22 1750  MRSA Next Gen by PCR, Nasal  Once,   R        04/23/22 1749          Data Reviewed: I have personally reviewed following labs and imaging studies CBC: Recent Labs  Lab 04/23/22 1043 04/25/22 0547  WBC 16.7* 10.3   NEUTROABS 13.4*  --   HGB 15.6* 11.1*  HCT 48.6* 35.1*  MCV 93.3 97.2  PLT 465* 676   Basic Metabolic Panel: Recent Labs  Lab 04/23/22 1043 04/23/22 1522 04/24/22 0512 04/25/22 0547  NA 140 141 143 141  K 4.0 3.8 3.2* 4.4  CL 98 106 106 110  CO2 19* 20* 23 22  GLUCOSE 202* 133* 124* 101*  BUN '7 7 6 '$ <5*  CREATININE 1.13* 0.88 0.78 0.62  CALCIUM 9.5 7.9* 8.7* 7.7*  MG  --   --  1.6*  --    GFR: Estimated Creatinine Clearance: 90.9 mL/min (by C-G formula based on SCr of 0.62 mg/dL). Liver Function Tests: Recent Labs  Lab 04/23/22 1043  AST 54*  ALT 40  ALKPHOS 106  BILITOT 0.8  PROT 7.9  ALBUMIN 3.6   Recent Labs  Lab 04/23/22 1043  LIPASE 26  Sepsis Labs: Recent Labs  Lab 04/23/22 1044 04/23/22 1411 04/23/22 1522 04/24/22 0512 04/24/22 1422  PROCALCITON  --   --  <0.10 <0.10  --   LATICACIDVEN 8.7* 4.2*  --   --  1.5    Recent Results (from the past 240 hour(s))  Culture, blood (routine x 2)     Status: None (Preliminary result)   Collection Time: 04/23/22 10:47 AM   Specimen: BLOOD RIGHT ARM  Result Value Ref Range Status   Specimen Description   Final    BLOOD RIGHT ARM BOTTLES DRAWN AEROBIC AND ANAEROBIC Performed at [facility], [place] [address] [place], [place] [address]    Special Requests   Final    Blood Culture adequate volume Performed at [place] [address], [place], [place] [address]    Culture  Setup Time   Final    GRAM POSITIVE COCCI AEROBIC BOTTLE ONLY Organism ID to follow CRITICAL RESULT CALLED TO, READ BACK BY AND VERIFIED WITH: Otila Back, AT [id number] 04/24/22 Rush Landmark Performed at [facility], [place] [address] [place], [place] [address]    Culture GRAM POSITIVE COCCI  Final   Report Status PENDING  Incomplete  Culture, blood (routine x 2)     Status: None (Preliminary result)   Collection Time: 04/23/22 10:47 AM   Specimen: BLOOD LEFT ARM  Result Value Ref Range  Status   Specimen Description   Final    BLOOD LEFT ARM BOTTLES DRAWN AEROBIC AND ANAEROBIC Performed at [facility], [place] [address], [place], [place] [address]    Special Requests   Final    Blood Culture adequate volume Performed at [facility] [address], [place], [place] [address]    Culture   Final    NO GROWTH < 24 HOURS Performed at [place] [address], [place], [place] [address]    Report Status PENDING  Incomplete  Blood Culture ID Panel (Reflexed)     Status: Abnormal   Collection Time: 04/23/22 10:47 AM  Result Value Ref Range Status   Enterococcus faecalis NOT DETECTED NOT DETECTED Final   Enterococcus Faecium NOT DETECTED NOT DETECTED Final   Listeria monocytogenes NOT DETECTED NOT DETECTED Final   Staphylococcus species DETECTED (A) NOT DETECTED Final    Comment: CRITICAL RESULT CALLED TO, READ BACK BY AND VERIFIED WITH: Christean Grief PHARMD, AT 7209 04/24/22 D. VANHOOK    Staphylococcus aureus (BCID) NOT DETECTED NOT DETECTED Final   Staphylococcus epidermidis DETECTED (A) NOT DETECTED Final    Comment: Methicillin (oxacillin) resistant coagulase negative staphylococcus. Possible blood culture contaminant (unless isolated from more than one blood culture draw or clinical case suggests pathogenicity). No antibiotic treatment is indicated for blood  culture contaminants. CRITICAL RESULT CALLED TO, READ BACK BY AND VERIFIED WITH: Christean Grief PHARMD, AT 4709 04/24/22 D. VANHOOK    Staphylococcus lugdunensis NOT DETECTED NOT DETECTED Final   Streptococcus species NOT DETECTED NOT DETECTED Final   Streptococcus agalactiae NOT DETECTED NOT DETECTED Final   Streptococcus pneumoniae NOT DETECTED NOT DETECTED Final   Streptococcus pyogenes NOT DETECTED NOT DETECTED Final   A.calcoaceticus-baumannii NOT DETECTED NOT DETECTED Final   Bacteroides fragilis NOT DETECTED NOT DETECTED Final   Enterobacterales NOT DETECTED NOT  DETECTED Final   Enterobacter cloacae complex NOT DETECTED NOT DETECTED Final  Escherichia coli NOT DETECTED NOT DETECTED Final   Klebsiella aerogenes NOT DETECTED NOT DETECTED Final   Klebsiella oxytoca NOT DETECTED NOT DETECTED Final   Klebsiella pneumoniae NOT DETECTED NOT DETECTED Final   Proteus species NOT DETECTED NOT DETECTED Final   Salmonella species NOT DETECTED NOT DETECTED Final   Serratia marcescens NOT DETECTED NOT DETECTED Final   Haemophilus influenzae NOT DETECTED NOT DETECTED Final   Neisseria meningitidis NOT DETECTED NOT DETECTED Final   Pseudomonas aeruginosa NOT DETECTED NOT DETECTED Final   Stenotrophomonas maltophilia NOT DETECTED NOT DETECTED Final   Candida albicans NOT DETECTED NOT DETECTED Final   Candida auris NOT DETECTED NOT DETECTED Final   Candida glabrata NOT DETECTED NOT DETECTED Final   Candida krusei NOT DETECTED NOT DETECTED Final   Candida parapsilosis NOT DETECTED NOT DETECTED Final   Candida tropicalis NOT DETECTED NOT DETECTED Final   Cryptococcus neoformans/gattii NOT DETECTED NOT DETECTED Final   Methicillin resistance mecA/C DETECTED (A) NOT DETECTED Final    Comment: CRITICAL RESULT CALLED TO, READ BACK BY AND VERIFIED WITHOtila Back, AT 7408 04/24/22 Rush Landmark Performed at [facility], [address], [place], [place] [address]   Urine Culture     Status: None   Collection Time: 04/24/22  5:12 AM   Specimen: Urine, Clean Catch  Result Value Ref Range Status   Specimen Description   Final    URINE, CLEAN CATCH Performed at [facility], [place] [address], [place], [place] [address]    Special Requests   Final    NONE Performed at [facility], [place] [address] [place], [place] [address]    Culture   Final    NO GROWTH Performed at [facility], [place] [address] [place], [place] [address]    Report Status 04/25/2022 FINAL  Final  Resp panel by RT-PCR (RSV, Flu A&B, Covid)  Anterior Nasal Swab     Status: None   Collection Time: 04/24/22  8:40 AM   Specimen: Anterior Nasal Swab  Result Value Ref Range Status   SARS Coronavirus 2 by RT PCR NEGATIVE NEGATIVE Final    Comment: (NOTE) SARS-CoV-2 target nucleic acids are NOT DETECTED.  The SARS-CoV-2 RNA is generally detectable in upper respiratory specimens during the acute phase of infection. The lowest concentration of SARS-CoV-2 viral copies this assay can detect is 138 copies/mL. A negative result does not preclude SARS-Cov-2 infection and should not be used as the sole basis for treatment or other patient management decisions. A negative result may occur with  improper specimen collection/handling, submission of specimen other than nasopharyngeal swab, presence of viral mutation(s) within the areas targeted by this assay, and inadequate number of viral copies(<138 copies/mL). A negative result must be combined with clinical observations, patient history, and epidemiological information. The expected result is Negative.  Fact Sheet for Patients:  EntrepreneurPulse.com.au  Fact Sheet for Healthcare Providers:  IncredibleEmployment.be  This test is no t yet approved or cleared by the [place] FDA and  has been authorized for detection and/or diagnosis of SARS-CoV-2 by FDA under an Emergency Use Authorization (EUA). This EUA will remain  in effect (meaning this test can be used) for the duration of the COVID-19 declaration under Section 564(b)(1) of the Act, 21 U.S.C.section 360bbb-3(b)(1), unless the authorization is terminated  or revoked sooner.       Influenza A by PCR NEGATIVE NEGATIVE Final   Influenza B by PCR NEGATIVE NEGATIVE Final  Comment: (NOTE) The Xpert Xpress SARS-CoV-2/FLU/RSV plus assay is intended as an aid in the diagnosis of influenza from Nasopharyngeal swab specimens and should not be used as a sole basis for treatment. Nasal washings  and aspirates are unacceptable for Xpert Xpress SARS-CoV-2/FLU/RSV testing.  Fact Sheet for Patients: EntrepreneurPulse.com.au  Fact Sheet for Healthcare Providers: IncredibleEmployment.be  This test is not yet approved or cleared by the [place] FDA and has been authorized for detection and/or diagnosis of SARS-CoV-2 by FDA under an Emergency Use Authorization (EUA). This EUA will remain in effect (meaning this test can be used) for the duration of the COVID-19 declaration under Section 564(b)(1) of the Act, 21 U.S.C. section 360bbb-3(b)(1), unless the authorization is terminated or revoked.     Resp Syncytial Virus by PCR NEGATIVE NEGATIVE Final    Comment: (NOTE) Fact Sheet for Patients: EntrepreneurPulse.com.au  Fact Sheet for Healthcare Providers: IncredibleEmployment.be  This test is not yet approved or cleared by the [place] FDA and has been authorized for detection and/or diagnosis of SARS-CoV-2 by FDA under an Emergency Use Authorization (EUA). This EUA will remain in effect (meaning this test can be used) for the duration of the COVID-19 declaration under Section 564(b)(1) of the Act, 21 U.S.C. section 360bbb-3(b)(1), unless the authorization is terminated or revoked.  Performed at [facility], [place] [address] [place], [place] [address]     Antimicrobials: Anti-infectives (From admission, onward)    Start     Dose/Rate Route Frequency Ordered Stop   04/24/22 1400  vancomycin (VANCOCIN) IVPB 1000 mg/200 mL premix  Status:  Discontinued        1,000 mg 200 mL/hr over 60 Minutes Intravenous Every 24 hours 04/23/22 1532 04/24/22 0838   04/23/22 2200  metroNIDAZOLE (FLAGYL) IVPB 500 mg  Status:  Discontinued        500 mg 100 mL/hr over 60 Minutes Intravenous Every 12 hours 04/23/22 1749 04/24/22 0931   04/23/22 1800  ceFEPIme (MAXIPIME) 2 g in sodium chloride 0.9  % 100 mL IVPB  Status:  Discontinued        2 g 200 mL/hr over 30 Minutes Intravenous Every 8 hours 04/23/22 1533 04/24/22 0837   04/23/22 1100  vancomycin (VANCOREADY) IVPB 1750 mg/350 mL        1,750 mg 175 mL/hr over 120 Minutes Intravenous  Once 04/23/22 1041 04/23/22 1716   04/23/22 1045  metroNIDAZOLE (FLAGYL) IVPB 500 mg        500 mg 100 mL/hr over 60 Minutes Intravenous  Once 04/23/22 1037 04/23/22 1306   04/23/22 1045  ceFEPIme (MAXIPIME) 2 g in sodium chloride 0.9 % 100 mL IVPB        2 g 200 mL/hr over 30 Minutes Intravenous  Once 04/23/22 1040 04/23/22 1136      Culture/Microbiology    Component Value Date/Time   SDES  04/24/2022 5456    URINE, CLEAN CATCH Performed at [facility], [place] [address] [place], [place] [address]    SPECREQUEST  04/24/2022 9373    NONE Performed at [facility], [place] [address] [place], [place] [address]    CULT  04/24/2022 [phone]    NO GROWTH Performed at [facility], [place] [address] [place], [place] [address]    REPTSTATUS 04/25/2022 FINAL 04/24/2022 3559    Other culture-see note  Radiology Studies: CT ABDOMEN PELVIS W CONTRAST  Result Date: 04/23/2022 CLINICAL DATA:  Abdominal pain, acute nonlocalized. EXAM: CT ABDOMEN  AND PELVIS WITH CONTRAST TECHNIQUE: Multidetector CT imaging of the abdomen and pelvis was performed using the standard protocol following bolus administration of intravenous contrast. RADIATION DOSE REDUCTION: This exam was performed according to the departmental dose-optimization program which includes automated exposure control, adjustment of the mA and/or kV according to patient size and/or use of iterative reconstruction technique. CONTRAST:  160m OMNIPAQUE IOHEXOL 300 MG/ML  SOLN COMPARISON:  Multiple priors including CT March 21, 2022. FINDINGS: Lower chest: No acute abnormality. Hepatobiliary: Diffuse hepatic steatosis. Gallbladder is unremarkable. No biliary ductal  dilation. Pancreas: No pancreatic ductal dilation or evidence of acute inflammation. Spleen: No splenomegaly. Adrenals/Urinary Tract: Bilateral adrenal glands appear normal. No hydronephrosis. Kidneys demonstrate symmetric enhancement and excretion of contrast material. Urinary bladder is unremarkable for degree of distension. Stomach/Bowel: Stomach is unremarkable for degree of distension. No pathologic dilation of small or large bowel. The appendix and terminal ileum appear normal. Moderate volume of stool in the rectum with rectal wall thickening and perirectal stranding. Vascular/Lymphatic: Aortic atherosclerosis. No pathologically enlarged abdominal or pelvic lymph nodes. Reproductive: Uterus and bilateral adnexa are unremarkable. Other: No significant abdominopelvic free fluid. No pneumoperitoneum. Musculoskeletal: Similar T11 and L3 superior endplate irregularity associated with a Schmorl's node. No acute osseous abnormality. IMPRESSION: 1. Moderate volume of stool in the rectum with rectal wall thickening and perirectal stranding, suggestive of stercoral colitis. 2. Diffuse hepatic steatosis. 3.  Aortic Atherosclerosis (ICD10-I70.0). Electronically Signed   By: JDahlia BailiffM.D.   On: 04/23/2022 12:36   DG Chest Port 1 View  Result Date: 04/23/2022 CLINICAL DATA:  Fever. EXAM: PORTABLE CHEST 1 VIEW COMPARISON:  03/23/2022 and prior studies FINDINGS: This is a low volume study with mild LEFT basilar atelectasis again noted. Cardiomediastinal silhouette is unchanged. There is no evidence of focal airspace disease, pulmonary edema, suspicious pulmonary nodule/mass, pleural effusion, or pneumothorax. No acute bony abnormalities are identified. IMPRESSION: Low volume study with mild LEFT basilar atelectasis again noted. Electronically Signed   By: JMargarette CanadaM.D.   On: 04/23/2022 11:25     LOS: 2 days   RAntonieta Pert MD [facility]  04/25/2022, 9:05 AM

## 2022-04-25 NOTE — Hospital Course (Addendum)
52 y.o.f w significant hx of anoxic brain injury/TBI, chronic back pain, hypertension, diabetes w/ frequent visits to the ED with admissions usually related to her abdominal pain presented with abdominal pain,nausea,vomiting, unable to take any of her medications and unable to tolerate any oral food or liquid. She had fever blood work showed a white count of 16.7 with a lactic acid initially of 8.7 that is decreased to 4.2, blood sugar is 202 with a bicarb of 19 and a anion gap of greater than 20.  Chest x-ray low volume with mild left basilar atelectasis.  CT abdomen pelvis with contrast> moderate volume stool in the rectum with rectal wall thickening and perirectal stranding suggestive of stercoral colitis and diffuse hepatic steatosis. Other labs showed normal procalcitonin mild hypokalemia Lactic acidosis and leucocytosis resolved. Her constipation improved with laxatives.Did not need suppository. She had 1 bottle from blood culture growing Staph epidermidis likely contamination Blood culture updated there was yet to be identified gram-negative rods resulted 1/3 per micro lab the gram-negative rod is not 1 that is the BCID panel since it would have shown up on the BCID.  It turned out to be Acinetobacter bacteremia and that would need treatment. Discussed with Dr. Johnny Bridge.

## 2022-04-25 NOTE — TOC Initial Note (Signed)
Transition of Care [facility]) - Initial/Assessment Note    Patient Details  Name: [name] MRN: [id number] Date of Birth: 11-05-1970  Transition of Care [facility]) CM/SW Contact:    Leeroy Cha, RN Phone Number: 04/25/2022, 8:23 AM  Clinical Narrative:                  Transition of Care [facility]) Screening Note   Patient Details  Name: [name] Date of Birth: [birth year]   Transition of Care [facility]) CM/SW Contact:    Leeroy Cha, RN Phone Number: 04/25/2022, 8:23 AM    Transition of Care Department (TOC) has reviewed patient and no TOC needs have been identified at this time. We will continue to monitor patient advancement through interdisciplinary progression rounds. If new patient transition needs arise, please place a TOC consult.    Expected Discharge Plan: Home/Self Care Barriers to Discharge: Continued Medical Work up   Patient Goals and CMS Choice Patient states their goals for this hospitalization and ongoing recovery are:: to return to my home well CMS Medicare.gov Compare Post Acute Care list provided to:: Patient        Expected Discharge Plan and Services   Discharge Planning Services: CM Consult   Living arrangements for the past 2 months: Single Family Home                                      Prior Living Arrangements/Services Living arrangements for the past 2 months: Single Family Home Lives with:: Spouse Patient language and need for interpreter reviewed:: Yes Do you feel safe going back to the place where you live?: Yes            Criminal Activity/Legal Involvement Pertinent to Current Situation/Hospitalization: No - Comment as needed  Activities of Daily Living Home Assistive Devices/Equipment: None ADL Screening (condition at time of admission) Patient's cognitive ability adequate to safely complete daily activities?: Yes Is the patient deaf or have difficulty hearing?: No Does the patient have difficulty  seeing, even when wearing glasses/contacts?: No Does the patient have difficulty concentrating, remembering, or making decisions?: No Patient able to express need for assistance with ADLs?: Yes Does the patient have difficulty dressing or bathing?: No Independently performs ADLs?: Yes (appropriate for developmental age) Does the patient have difficulty walking or climbing stairs?: No Weakness of Legs: Both Weakness of Arms/Hands: None  Permission Sought/Granted                  Emotional Assessment Appearance:: Appears stated age Attitude/Demeanor/Rapport: Engaged Affect (typically observed): Calm Orientation: : Oriented to Self, Oriented to Place, Oriented to  Time, Oriented to Situation Alcohol / Substance Use: Never Used Psych Involvement: No (comment)  Admission diagnosis:  Lactic acidosis [E87.20] Sepsis ([place]) [A41.9] Abdominal pain, unspecified abdominal location [R10.9] Nausea and vomiting, unspecified vomiting type [R11.2] Patient Active Problem List   Diagnosis Date Noted   Colitis 03/22/2022   Acute colitis 03/22/2022   Dehydration 03/22/2022   Refractory nausea and vomiting 03/03/2022   Multiple rib fractures 02/03/2022   High anion gap metabolic acidosis 25/95/6387   Acute encephalopathy 10/14/2021   Hypernatremia 09/20/2021   Barrett's esophagus    Cyclical vomiting syndrome 09/18/2021   Chronic pain syndrome 09/18/2021   Class 2 obesity due to excess calories with body mass index (BMI) of 37.0 to 37.9 in adult 09/18/2021   Hypothyroidism 09/18/2021  Starvation ketoacidosis 08/27/2021   Viral gastroenteritis 08/27/2021   Intractable vomiting 08/02/2021   Aortic atherosclerosis ([place]) 08/02/2021   LFT elevation 08/02/2021   Hyperbilirubinemia 08/02/2021   Hypoalbuminemia 08/02/2021   T2DM (type 2 diabetes mellitus) ([place]) 08/02/2021   Hyperlipidemia 11/11/2020   Anoxic brain damage ([place]) 08/16/2020   History of depression    Sleep disturbance     Anxiety state    N&V (nausea and vomiting)    Substance abuse ([place])    Anemia of chronic disease    Acute metabolic encephalopathy 22/29/7989   Hematochezia    Acute blood loss anemia    Acute respiratory failure with hypoxia (HCC)    Non-traumatic rhabdomyolysis    Cardiac arrest ([place]) 06/08/2020   AKI (acute kidney injury) ([place]) 05/10/2020   Fracture of ankle, bimalleolar, right, closed 03/28/2020   Iron deficiency 05/05/2019   Symptomatic anemia 05/05/2019   Nausea & vomiting 12/26/2018   Essential hypertension 12/26/2018   ARF (acute renal failure) ([place]) 12/26/2018   Intractable nausea and vomiting 11/28/2018   Bipolar affective disorder ([place]) 11/28/2018   Gastroenteritis 11/19/2015   Hypomagnesemia    C. difficile colitis    SIRS (systemic inflammatory response syndrome) ([place]) 01/11/2015   Cellulitis 01/03/2015   Tachycardia    Opiate withdrawal ([place]) 12/18/2014   Depression with anxiety 12/17/2014   Herpes simplex virus type 1 (HSV-1) dermatitis    Sacral fracture ([place]) 12/12/2014   Leukocytosis 12/12/2014   Chronic lower back pain 12/12/2014   Sacral fracture, closed ([place]) 12/12/2014   Nausea with vomiting    Intractable abdominal pain 10/15/2014   Abdominal pain 10/15/2014   Hypokalemia 09/04/2014   Sepsis ([place]) 09/01/2014   Abdominal pain, generalized 09/01/2014   PTSD (post-traumatic stress disorder) 09/01/2014   Endometriosis 09/01/2014   Sinus tachycardia 09/01/2014   Lactic acidosis 09/01/2014   Severe sepsis (HCC) 09/01/2014   PCP:  Ferd Hibbs, NP Pharmacy:   [facility] [address] [facility] [place] [address] Phone: [phone] Fax: [phone]     Social Determinants of Health (SDOH) Social History: SDOH Screenings   Food Insecurity: No Food Insecurity (04/25/2022)  Housing: Low Risk  (04/25/2022)  Transportation Needs: No Transportation Needs (04/25/2022)  Utilities: Not At Risk (04/25/2022)   Depression (PHQ2-9): Low Risk  (08/16/2020)  Tobacco Use: Low Risk  (04/23/2022)   SDOH Interventions:     Readmission Risk Interventions   Row Labels 03/24/2022   11:01 AM 10/06/2021   10:23 AM 09/20/2021    3:41 PM  Readmission Risk Prevention Plan   Section Header. No data exists in this row.     Transportation Screening   Complete Complete Complete  PCP or Specialist Appt within 3-5 Days    Complete   HRI or [place]    Complete   Social Work Consult for [place] Planning/Counseling    Complete   Palliative Care Screening    Not Applicable   Medication Review Press [employer])   Complete Referral to Pharmacy Referral to Pharmacy  PCP or Specialist appointment within 3-5 days of discharge   Complete    HRI or [place]   Complete  Complete  SW Recovery Care/Counseling Consult   Complete  Complete  Palliative Care Screening   Not Applicable  Not [place]   Not Applicable  Not Applicable

## 2022-04-26 DIAGNOSIS — R6520 Severe sepsis without septic shock: Secondary | ICD-10-CM | POA: Diagnosis not present

## 2022-04-26 DIAGNOSIS — A419 Sepsis, unspecified organism: Secondary | ICD-10-CM | POA: Diagnosis not present

## 2022-04-26 DIAGNOSIS — N179 Acute kidney failure, unspecified: Secondary | ICD-10-CM | POA: Diagnosis not present

## 2022-04-26 LAB — CBC
HCT: 37.6 % (ref 36.0–46.0)
Hemoglobin: 11.7 g/dL — ABNORMAL LOW (ref 12.0–15.0)
MCH: 30.5 pg (ref 26.0–34.0)
MCHC: 31.1 g/dL (ref 30.0–36.0)
MCV: 98.2 fL (ref 80.0–100.0)
Platelets: 206 10*3/uL (ref 150–400)
RBC: 3.83 MIL/uL — ABNORMAL LOW (ref 3.87–5.11)
RDW: 14 % (ref 11.5–15.5)
WBC: 9 10*3/uL (ref 4.0–10.5)
nRBC: 0 % (ref 0.0–0.2)

## 2022-04-26 LAB — CULTURE, BLOOD (ROUTINE X 2): Special Requests: ADEQUATE

## 2022-04-26 LAB — BASIC METABOLIC PANEL
Anion gap: 5 (ref 5–15)
BUN: 7 mg/dL (ref 6–20)
CO2: 21 mmol/L — ABNORMAL LOW (ref 22–32)
Calcium: 8.2 mg/dL — ABNORMAL LOW (ref 8.9–10.3)
Chloride: 113 mmol/L — ABNORMAL HIGH (ref 98–111)
Creatinine, Ser: 0.61 mg/dL (ref 0.44–1.00)
GFR, Estimated: >60 mL/min (ref >60)
Glucose, Bld: 122 mg/dL — ABNORMAL HIGH (ref 70–99)
Potassium: 4.4 mmol/L (ref 3.5–5.1)
Sodium: 139 mmol/L (ref 135–145)

## 2022-04-26 LAB — GLUCOSE, CAPILLARY
Glucose-Capillary: 130 mg/dL — ABNORMAL HIGH (ref 70–99)
Glucose-Capillary: 108 mg/dL — ABNORMAL HIGH (ref 70–99)
Glucose-Capillary: 119 mg/dL — ABNORMAL HIGH (ref 70–99)
Glucose-Capillary: 128 mg/dL — ABNORMAL HIGH (ref 70–99)

## 2022-04-26 LAB — MRSA NEXT GEN BY PCR, NASAL: MRSA by PCR Next Gen: DETECTED — AB

## 2022-04-26 MED ORDER — BISACODYL 10 MG RE SUPP: 10.0000 mg | Freq: Once | RECTAL | Status: DC

## 2022-04-26 MED ORDER — SODIUM CHLORIDE 0.9 % IV SOLN
3.0000 g | Freq: Four times a day (QID) | INTRAVENOUS | Status: DC
  Administered 2022-04-26 – 2022-04-27 (×4): 3 g via INTRAVENOUS
  Filled 2022-04-26 (×6): qty 8

## 2022-04-26 NOTE — Progress Notes (Addendum)
PROGRESS NOTE [name]  VEL:[id number] DOB: [birth year] DOA: 04/23/2022 PCP: Ferd Hibbs, NP   Brief Narrative/Hospital Course: 52 y.o.f w significant hx of anoxic brain injury/TBI, chronic back pain, hypertension, diabetes w/ frequent visits to the ED with admissions usually related to her abdominal pain presented with abdominal pain,nausea,vomiting, unable to take any of her medications and unable to tolerate any oral food or liquid. She had fever blood work showed a white count of 16.7 with a lactic acid initially of 8.7 that is decreased to 4.2, blood sugar is 202 with a bicarb of 19 and a anion gap of greater than 20.  Chest x-ray low volume with mild left basilar atelectasis.  CT abdomen pelvis with contrast> moderate volume stool in the rectum with rectal wall thickening and perirectal stranding suggestive of stercoral colitis and diffuse hepatic steatosis. Other labs showed normal procalcitonin mild hypokalemia Lactic acidosis and leucocytosis resolved. Her constipation improved with laxatives.Did not need suppository. She had 1 bottle from blood culture growing Staph epidermidis likely contamination Blood culture updated there was yet to be identified gram-negative rods resulted 1/3 per micro lab the gram-negative rod is not 1 that is the BCID panel since it would have shown up on the BCID.  It turned out to be Acinetobacter bacteremia and that would need treatment. Discussed with Dr. Johnny Bridge.  Subjective: Seen and examined.  She feels slightly better today having urine retention mild abdominal discomfort present but no nausea and vomiting.  Last bowel movement 2 days ago.   Assessment and Plan: Principal Problem:   Sepsis ([place]) Active Problems:   High anion gap metabolic acidosis   Bipolar affective disorder (HCC)   Essential hypertension   AKI (acute kidney injury) (HCC)   T2DM (type 2 diabetes mellitus) (HCC)   Hypothyroidism   Class 2 obesity due to excess calories with  body mass index (BMI) of 37.0 to 37.9 in adult   Abdominal pain, generalized   Lactic acidosis   Acinetobacter Ilwoffii sepsis: Likely significant bacteremia per ID will need antibiotic treatment> changing antibiotic to Unasyn for sensitivity> on discharge Total 10 days of antibiotics Sepsis POA 2/2 above.  Currently stable Staff epidermidis in blood culture likely contamination:   Nausea vomiting abdominal pain Stercoral colitis Constipation:  patient had frequent ED visits and hospitalization, CT scan with constipation and stercoral colitis.  Likely some component of cyclical vomiting.  Having bowel movement constipation resolved continue with laxatives.  Tolerating diet.     Lactic acidosis in the setting of nausea vomiting: Resolved.   UDS positive for amphetamine and opiates Type 2 diabetes mellitus stable Cbg Recent Labs  Lab 04/25/22 1146 04/25/22 1651 04/25/22 1937 04/26/22 0744 04/26/22 1209  GLUCAP 102* 122* 130* 130* 108*    AKI resolved  Hypertension: Overnight BP was soft.  Coreg and amlodipine has been held, BP stable continue to hold medications and consider resuming at home or after discussion with PCP monitor BP closely at home Chronic pain continue home Percocet Bipolar disorder on prazosin Abilify trazodone Hypothyroidism continue Synthroid Hypokalemia: Resolved Urine retention insert Foley catheter, add flomax. Class I Obesity:Patient's Body mass index is 33.2 kg/m. : Will benefit with PCP follow-up, weight loss  healthy lifestyle and outpatient sleep evaluation.  DVT prophylaxis: enoxaparin (LOVENOX) injection 40 mg Start: 04/23/22 2200 Code Status:   Code Status: Full Code Family Communication: plan of care discussed with patient at bedside. Patient status is: inpatient because of constipation nausea vomiting and source Level of care: Progressive  Dispo: The patient is from: home            Anticipated disposition: home in 24 hrs Objective: Vitals  last 24 hrs: Vitals:   04/25/22 1941 04/25/22 2333 04/26/22 0458 04/26/22 1237  BP: 94/63 101/71 (!) 140/100 126/85  Pulse: 61 69 62 72  Resp: 15  19   Temp: 97.7 F (36.5 C)  98 F (36.7 C)   TempSrc: Oral  Oral   SpO2: 99%  100%   Weight:      Height:       Weight change:   Physical Examination: General exam: AA, weak,older appearing HEENT:Oral mucosa moist, Ear/Nose WNL grossly, dentition normal. Respiratory system: bilaterally clear BS,no use of accessory muscle Cardiovascular system: S1 & S2 +, regular rate, JVD neg. Gastrointestinal system: Abdomen soft,NT,ND,BS+ Nervous System:Alert, awake, moving extremities and grossly nonfocal Extremities: LE ankle edema neg, lower extremities warm Skin: No rashes,no icterus. MSK: Normal muscle bulk,tone, power   Medications reviewed:  Scheduled Meds:  ARIPiprazole  5 mg Oral Daily   atorvastatin  10 mg Oral Daily   bisacodyl  10 mg Rectal Once   enoxaparin (LOVENOX) injection  40 mg Subcutaneous Q24H   FLUoxetine  40 mg Oral Daily   insulin aspart  0-15 Units Subcutaneous TID WC   insulin aspart  0-5 Units Subcutaneous QHS   lactulose  20 g Oral Daily   levothyroxine  50 mcg Oral Q0600   metoCLOPramide (REGLAN) injection  5 mg Intravenous Q6H   oxyCODONE-acetaminophen  1 tablet Oral QID   pantoprazole  40 mg Oral Daily   polyethylene glycol  17 g Oral BID   prazosin  2 mg Oral QHS   tamsulosin  0.4 mg Oral QPC breakfast   traZODone  150 mg Oral QHS   Continuous Infusions:  0.9 % NaCl with KCl 20 mEq / L 50 mL/hr at 04/25/22 1131   ceFEPime (MAXIPIME) IV 2 g (04/26/22 0546)      Diet Order             Diet full liquid Room service appropriate? Yes; Fluid consistency: Thin  Diet effective now                   Intake/Output Summary (Last 24 hours) at 04/26/2022 1317 Last data filed at 04/26/2022 1200 Gross per 24 hour  Intake 2817.66 ml  Output 300 ml  Net 2517.66 ml   Net IO Since Admission: 4,285.12 mL  [04/26/22 1317]  Wt Readings from Last 3 Encounters:  04/25/22 89.1 kg  03/21/22 89.4 kg  03/03/22 89.4 kg     Unresulted Labs (From admission, onward)     Start     Ordered   04/25/22 4174  Basic metabolic panel  Daily,   R      04/24/22 0837   04/25/22 0500  CBC  Daily,   R      04/24/22 0837          Data Reviewed: I have personally reviewed following labs and imaging studies CBC: Recent Labs  Lab 04/23/22 1043 04/25/22 0547 04/26/22 0528  WBC 16.7* 10.3 9.0  NEUTROABS 13.4*  --   --   HGB 15.6* 11.1* 11.7*  HCT 48.6* 35.1* 37.6  MCV 93.3 97.2 98.2  PLT 465* 215 081   Basic Metabolic Panel: Recent Labs  Lab 04/23/22 1043 04/23/22 1522 04/24/22 0512 04/25/22 0547 04/26/22 0528  NA 140 141 143 141 139  K 4.0 3.8 3.2* 4.4  4.4  CL 98 106 106 110 113*  CO2 19* 20* 23 22 21*  GLUCOSE 202* 133* 124* 101* 122*  BUN '7 7 6 '$ <5* 7  CREATININE 1.13* 0.88 0.78 0.62 0.61  CALCIUM 9.5 7.9* 8.7* 7.7* 8.2*  MG  --   --  1.6*  --   --    GFR: Estimated Creatinine Clearance: 90.9 mL/min (by C-G formula based on SCr of 0.61 mg/dL). Liver Function Tests: Recent Labs  Lab 04/23/22 1043  AST 54*  ALT 40  ALKPHOS 106  BILITOT 0.8  PROT 7.9  ALBUMIN 3.6   Recent Labs  Lab 04/23/22 1043  LIPASE 26  Sepsis Labs: Recent Labs  Lab 04/23/22 1044 04/23/22 1411 04/23/22 1522 04/24/22 0512 04/24/22 1422  PROCALCITON  --   --  <0.10 <0.10  --   LATICACIDVEN 8.7* 4.2*  --   --  1.5    Recent Results (from the past 240 hour(s))  Culture, blood (routine x 2)     Status: Abnormal   Collection Time: 04/23/22 10:47 AM   Specimen: BLOOD RIGHT ARM  Result Value Ref Range Status   Specimen Description   Final    BLOOD RIGHT ARM BOTTLES DRAWN AEROBIC AND ANAEROBIC Performed at [facility], [place] [address], [place], [place] [address]    Special Requests   Final    Blood Culture adequate volume Performed at [facility]  [address], [place], [place] [address]    Culture  Setup Time   Final    GRAM POSITIVE COCCI AEROBIC BOTTLE ONLY Organism ID to follow CRITICAL RESULT CALLED TO, READ BACK BY AND VERIFIED WITH: Christean Grief PHARMD, AT 0240 04/24/22 D. VANHOOK    Culture (A)  Final    STAPHYLOCOCCUS EPIDERMIDIS THE SIGNIFICANCE OF ISOLATING THIS ORGANISM FROM A SINGLE SET OF BLOOD CULTURES WHEN MULTIPLE SETS ARE DRAWN IS UNCERTAIN. PLEASE NOTIFY THE MICROBIOLOGY DEPARTMENT WITHIN ONE WEEK IF SPECIATION AND SENSITIVITIES ARE REQUIRED. ACINETOBACTER LWOFFII CRITICAL RESULT CALLED TO, READ BACK BY AND VERIFIED WITH: T. GREEN PHARMD, AT 1201 04/25/22 Rush Landmark Performed at [facility], [place] [address], [place], [place] [address]    Report Status 04/26/2022 FINAL  Final   Organism ID, Bacteria ACINETOBACTER LWOFFII  Final      Susceptibility   Acinetobacter lwoffii - MIC*    CEFTAZIDIME <=1 SENSITIVE Sensitive     CIPROFLOXACIN <=0.25 SENSITIVE Sensitive     GENTAMICIN <=1 SENSITIVE Sensitive     IMIPENEM <=0.25 SENSITIVE Sensitive     TRIMETH/SULFA <=20 SENSITIVE Sensitive     AMPICILLIN/SULBACTAM <=2 SENSITIVE Sensitive     * ACINETOBACTER LWOFFII  Culture, blood (routine x 2)     Status: None (Preliminary result)   Collection Time: 04/23/22 10:47 AM   Specimen: BLOOD LEFT ARM  Result Value Ref Range Status   Specimen Description   Final    BLOOD LEFT ARM BOTTLES DRAWN AEROBIC AND ANAEROBIC Performed at [facility] [facility] [address] [place], [place] [address]    Special Requests   Final    Blood Culture adequate volume Performed at [place] [address], [place], [place] [address]    Culture   Final    NO GROWTH 3 DAYS Performed at [facility], [place] [address] [place], [place] [address]    Report Status PENDING  Incomplete  Blood Culture ID Panel (Reflexed)     Status: Abnormal  Collection Time: 04/23/22 10:47 AM  Result Value Ref Range Status    Enterococcus faecalis NOT DETECTED NOT DETECTED Final   Enterococcus Faecium NOT DETECTED NOT DETECTED Final   Listeria monocytogenes NOT DETECTED NOT DETECTED Final   Staphylococcus species DETECTED (A) NOT DETECTED Final    Comment: CRITICAL RESULT CALLED TO, READ BACK BY AND VERIFIED WITH: Christean Grief PHARMD, AT 1950 04/24/22 D. VANHOOK    Staphylococcus aureus (BCID) NOT DETECTED NOT DETECTED Final   Staphylococcus epidermidis DETECTED (A) NOT DETECTED Final    Comment: Methicillin (oxacillin) resistant coagulase negative staphylococcus. Possible blood culture contaminant (unless isolated from more than one blood culture draw or clinical case suggests pathogenicity). No antibiotic treatment is indicated for blood  culture contaminants. CRITICAL RESULT CALLED TO, READ BACK BY AND VERIFIED WITH: Christean Grief PHARMD, AT 9326 04/24/22 D. VANHOOK    Staphylococcus lugdunensis NOT DETECTED NOT DETECTED Final   Streptococcus species NOT DETECTED NOT DETECTED Final   Streptococcus agalactiae NOT DETECTED NOT DETECTED Final   Streptococcus pneumoniae NOT DETECTED NOT DETECTED Final   Streptococcus pyogenes NOT DETECTED NOT DETECTED Final   A.calcoaceticus-baumannii NOT DETECTED NOT DETECTED Final   Bacteroides fragilis NOT DETECTED NOT DETECTED Final   Enterobacterales NOT DETECTED NOT DETECTED Final   Enterobacter cloacae complex NOT DETECTED NOT DETECTED Final   Escherichia coli NOT DETECTED NOT DETECTED Final   Klebsiella aerogenes NOT DETECTED NOT DETECTED Final   Klebsiella oxytoca NOT DETECTED NOT DETECTED Final   Klebsiella pneumoniae NOT DETECTED NOT DETECTED Final   Proteus species NOT DETECTED NOT DETECTED Final   Salmonella species NOT DETECTED NOT DETECTED Final   Serratia marcescens NOT DETECTED NOT DETECTED Final   Haemophilus influenzae NOT DETECTED NOT DETECTED Final   Neisseria meningitidis NOT DETECTED NOT DETECTED Final   Pseudomonas aeruginosa NOT DETECTED NOT DETECTED Final    Stenotrophomonas maltophilia NOT DETECTED NOT DETECTED Final   Candida albicans NOT DETECTED NOT DETECTED Final   Candida auris NOT DETECTED NOT DETECTED Final   Candida glabrata NOT DETECTED NOT DETECTED Final   Candida krusei NOT DETECTED NOT DETECTED Final   Candida parapsilosis NOT DETECTED NOT DETECTED Final   Candida tropicalis NOT DETECTED NOT DETECTED Final   Cryptococcus neoformans/gattii NOT DETECTED NOT DETECTED Final   Methicillin resistance mecA/C DETECTED (A) NOT DETECTED Final    Comment: CRITICAL RESULT CALLED TO, READ BACK BY AND VERIFIED WITHOtila Back, AT 7124 04/24/22 Rush Landmark Performed at [facility], [address], [place], [place] [address]   Urine Culture     Status: None   Collection Time: 04/24/22  5:12 AM   Specimen: Urine, Clean Catch  Result Value Ref Range Status   Specimen Description   Final    URINE, CLEAN CATCH Performed at [facility], [place] [address], [place], [place] [address]    Special Requests   Final    NONE Performed at [facility], [place] [address], [place], [place] [address]    Culture   Final    NO GROWTH Performed at [facility], [place] [address], [place], [place] [address]    Report Status 04/25/2022 FINAL  Final  Resp panel by RT-PCR (RSV, Flu A&B, Covid) Anterior Nasal Swab     Status: None   Collection Time: 04/24/22  8:40 AM   Specimen: Anterior Nasal Swab  Result Value Ref Range Status   SARS Coronavirus 2 by RT PCR NEGATIVE NEGATIVE Final  Comment: (NOTE) SARS-CoV-2 target nucleic acids are NOT DETECTED.  The SARS-CoV-2 RNA is generally detectable in upper respiratory specimens during the acute phase of infection. The lowest concentration of SARS-CoV-2 viral copies this assay can detect is 138 copies/mL. A negative result does not preclude SARS-Cov-2 infection and should not be used as the sole basis for treatment or other patient management decisions. A negative  result may occur with  improper specimen collection/handling, submission of specimen other than nasopharyngeal swab, presence of viral mutation(s) within the areas targeted by this assay, and inadequate number of viral copies(<138 copies/mL). A negative result must be combined with clinical observations, patient history, and epidemiological information. The expected result is Negative.  Fact Sheet for Patients:  EntrepreneurPulse.com.au  Fact Sheet for Healthcare Providers:  IncredibleEmployment.be  This test is no t yet approved or cleared by the [place] FDA and  has been authorized for detection and/or diagnosis of SARS-CoV-2 by FDA under an Emergency Use Authorization (EUA). This EUA will remain  in effect (meaning this test can be used) for the duration of the COVID-19 declaration under Section 564(b)(1) of the Act, 21 U.S.C.section 360bbb-3(b)(1), unless the authorization is terminated  or revoked sooner.       Influenza A by PCR NEGATIVE NEGATIVE Final   Influenza B by PCR NEGATIVE NEGATIVE Final    Comment: (NOTE) The Xpert Xpress SARS-CoV-2/FLU/RSV plus assay is intended as an aid in the diagnosis of influenza from Nasopharyngeal swab specimens and should not be used as a sole basis for treatment. Nasal washings and aspirates are unacceptable for Xpert Xpress SARS-CoV-2/FLU/RSV testing.  Fact Sheet for Patients: EntrepreneurPulse.com.au  Fact Sheet for Healthcare Providers: IncredibleEmployment.be  This test is not yet approved or cleared by the [place] FDA and has been authorized for detection and/or diagnosis of SARS-CoV-2 by FDA under an Emergency Use Authorization (EUA). This EUA will remain in effect (meaning this test can be used) for the duration of the COVID-19 declaration under Section 564(b)(1) of the Act, 21 U.S.C. section 360bbb-3(b)(1), unless the authorization is  terminated or revoked.     Resp Syncytial Virus by PCR NEGATIVE NEGATIVE Final    Comment: (NOTE) Fact Sheet for Patients: EntrepreneurPulse.com.au  Fact Sheet for Healthcare Providers: IncredibleEmployment.be  This test is not yet approved or cleared by the [place] FDA and has been authorized for detection and/or diagnosis of SARS-CoV-2 by FDA under an Emergency Use Authorization (EUA). This EUA will remain in effect (meaning this test can be used) for the duration of the COVID-19 declaration under Section 564(b)(1) of the Act, 21 U.S.C. section 360bbb-3(b)(1), unless the authorization is terminated or revoked.  Performed at [facility], [place] [address] [place], [place] [address]   MRSA Next Gen by PCR, Nasal     Status: Abnormal   Collection Time: 04/25/22  9:25 PM   Specimen: Nasal Mucosa; Nasal Swab  Result Value Ref Range Status   MRSA by PCR Next Gen DETECTED (A) NOT DETECTED Final    Comment: (NOTE) The GeneXpert MRSA Assay (FDA approved for NASAL specimens only), is one component of a comprehensive MRSA colonization surveillance program. It is not intended to diagnose MRSA infection nor to guide or monitor treatment for MRSA infections. Test performance is not FDA approved in patients less than 10 years old. Performed at [facility], [place] [address] [place], [place] [address]     Antimicrobials: Anti-infectives (From admission, onward)    Start  Dose/Rate Route Frequency Ordered Stop   04/25/22 1400  ceFEPIme (MAXIPIME) 2 g in sodium chloride 0.9 % 100 mL IVPB        2 g 200 mL/hr over 30 Minutes Intravenous Every 8 hours 04/25/22 1304     04/24/22 1400  vancomycin (VANCOCIN) IVPB 1000 mg/200 mL premix  Status:  Discontinued        1,000 mg 200 mL/hr over 60 Minutes Intravenous Every 24 hours 04/23/22 1532 04/24/22 0838   04/23/22 2200  metroNIDAZOLE (FLAGYL) IVPB 500 mg   Status:  Discontinued        500 mg 100 mL/hr over 60 Minutes Intravenous Every 12 hours 04/23/22 1749 04/24/22 0931   04/23/22 1800  ceFEPIme (MAXIPIME) 2 g in sodium chloride 0.9 % 100 mL IVPB  Status:  Discontinued        2 g 200 mL/hr over 30 Minutes Intravenous Every 8 hours 04/23/22 1533 04/24/22 0837   04/23/22 1100  vancomycin (VANCOREADY) IVPB 1750 mg/350 mL        1,750 mg 175 mL/hr over 120 Minutes Intravenous  Once 04/23/22 1041 04/23/22 1716   04/23/22 1045  metroNIDAZOLE (FLAGYL) IVPB 500 mg        500 mg 100 mL/hr over 60 Minutes Intravenous  Once 04/23/22 1037 04/23/22 1306   04/23/22 1045  ceFEPIme (MAXIPIME) 2 g in sodium chloride 0.9 % 100 mL IVPB        2 g 200 mL/hr over 30 Minutes Intravenous  Once 04/23/22 1040 04/23/22 1136      Culture/Microbiology    Component Value Date/Time   SDES  04/24/2022 1779    URINE, CLEAN CATCH Performed at [facility], [place] [address], [place], [place] [address]    SPECREQUEST  04/24/2022 0923    NONE Performed at [facility], [place] [address], [place], [place] [address]    CULT  04/24/2022 [phone]    NO GROWTH Performed at [facility], [place] [address], [place], [place] [address]    REPTSTATUS 04/25/2022 FINAL 04/24/2022 6256    Other culture-see note  Radiology Studies: No results found.   LOS: 3 days   Antonieta Pert, MD [facility]  04/26/2022, 1:17 PM

## 2022-04-27 DIAGNOSIS — R6520 Severe sepsis without septic shock: Secondary | ICD-10-CM | POA: Diagnosis not present

## 2022-04-27 DIAGNOSIS — A419 Sepsis, unspecified organism: Secondary | ICD-10-CM | POA: Diagnosis not present

## 2022-04-27 DIAGNOSIS — N179 Acute kidney failure, unspecified: Secondary | ICD-10-CM | POA: Diagnosis not present

## 2022-04-27 LAB — BASIC METABOLIC PANEL
Anion gap: 6 (ref 5–15)
BUN: 7 mg/dL (ref 6–20)
CO2: 21 mmol/L — ABNORMAL LOW (ref 22–32)
Calcium: 8.1 mg/dL — ABNORMAL LOW (ref 8.9–10.3)
Chloride: 112 mmol/L — ABNORMAL HIGH (ref 98–111)
Creatinine, Ser: 0.66 mg/dL (ref 0.44–1.00)
GFR, Estimated: >60 mL/min (ref >60)
Glucose, Bld: 117 mg/dL — ABNORMAL HIGH (ref 70–99)
Potassium: 4.5 mmol/L (ref 3.5–5.1)
Sodium: 139 mmol/L (ref 135–145)

## 2022-04-27 LAB — CBC
HCT: 39.6 % (ref 36.0–46.0)
Hemoglobin: 12.2 g/dL (ref 12.0–15.0)
MCH: 29.8 pg (ref 26.0–34.0)
MCHC: 30.8 g/dL (ref 30.0–36.0)
MCV: 96.8 fL (ref 80.0–100.0)
Platelets: 210 10*3/uL (ref 150–400)
RBC: 4.09 MIL/uL (ref 3.87–5.11)
RDW: 13.6 % (ref 11.5–15.5)
WBC: 8.4 10*3/uL (ref 4.0–10.5)
nRBC: 0 % (ref 0.0–0.2)

## 2022-04-27 LAB — GLUCOSE, CAPILLARY: Glucose-Capillary: 107 mg/dL — ABNORMAL HIGH (ref 70–99)

## 2022-04-27 MED ORDER — TAMSULOSIN HCL 0.4 MG PO CAPS: 0.4000 mg | ORAL_CAPSULE | Freq: Every day | ORAL | 0 refills | Status: AC

## 2022-04-27 MED ORDER — POLYETHYLENE GLYCOL 3350 17 G PO PACK: 17.0000 g | PACK | Freq: Two times a day (BID) | ORAL | 0 refills | Status: AC

## 2022-04-27 MED ORDER — METOCLOPRAMIDE HCL 5 MG PO TABS: 5.0000 mg | ORAL_TABLET | Freq: Three times a day (TID) | ORAL | 0 refills | Status: DC

## 2022-04-27 MED ORDER — SULFAMETHOXAZOLE-TRIMETHOPRIM 800-160 MG PO TABS: 1.0000 | ORAL_TABLET | Freq: Two times a day (BID) | ORAL | 0 refills | Status: AC

## 2022-04-27 MED ORDER — SULFAMETHOXAZOLE-TRIMETHOPRIM 800-160 MG PO TABS: 2.0000 | ORAL_TABLET | Freq: Two times a day (BID) | ORAL | Status: DC

## 2022-04-27 MED ORDER — TIZANIDINE HCL 4 MG PO TABS: 12.0000 mg | ORAL_TABLET | Freq: Three times a day (TID) | ORAL | 0 refills | Status: DC | PRN

## 2022-04-27 MED ORDER — LACTULOSE 10 GM/15ML PO SOLN: 20.0000 g | Freq: Every day | ORAL | 0 refills | Status: DC | PRN

## 2022-04-27 NOTE — Progress Notes (Signed)
Patient discharged home.  Discharge instructions explained, patient verbalizes understanding 

## 2022-04-27 NOTE — Discharge Summary (Signed)
Physician Discharge Summary  [name] KVQ:[id number] DOB: 03-Aug-1970 DOA: 04/23/2022  PCP: Ferd Hibbs, NP  Admit date: 04/23/2022 Discharge date: 04/27/2022 Recommendations for Outpatient Follow-up:  Follow up with PCP in 1 weeks-call for appointment Please obtain BMP/CBC in one week  Discharge Dispo: home Discharge Condition: Stable Code Status:   Code Status: Full Code Diet recommendation:  Diet Order             Diet general           Diet full liquid Room service appropriate? Yes; Fluid consistency: Thin  Diet effective now                    Brief/Interim Summary: 52 y.o.f w significant hx of anoxic brain injury/TBI, chronic back pain, hypertension, diabetes w/ frequent visits to the ED with admissions usually related to her abdominal pain presented with abdominal pain,nausea,vomiting, unable to take any of her medications and unable to tolerate any oral food or liquid. She had fever blood work showed a white count of 16.7 with a lactic acid initially of 8.7 that is decreased to 4.2, blood sugar is 202 with a bicarb of 19 and a anion gap of greater than 20.  Chest x-ray low volume with mild left basilar atelectasis.  CT abdomen pelvis with contrast> moderate volume stool in the rectum with rectal wall thickening and perirectal stranding suggestive of stercoral colitis and diffuse hepatic steatosis. Other labs showed normal procalcitonin mild hypokalemia Lactic acidosis and leucocytosis resolved. Her constipation improved with laxatives.Did not need suppository. She had 1 bottle from blood culture growing Staph epidermidis likely contamination Blood culture updated there was yet to be identified gram-negative rods resulted 1/3 per micro lab the gram-negative rod is not 1 that is the BCID panel since it would have shown up on the BCID.  It turned out to be Acinetobacter bacteremia and that would need treatment. Discussed with Dr. Johnny Bridge.  Patient was treated with Unasyn> on  discharge okay to switch to Bactrim to complete the course  Discharge Diagnoses:  Principal Problem:   Sepsis ([place]) Active Problems:   High anion gap metabolic acidosis   Bipolar affective disorder (HCC)   Essential hypertension   AKI (acute kidney injury) ([place])   T2DM (type 2 diabetes mellitus) ([place])   Hypothyroidism   Class 2 obesity due to excess calories with body mass index (BMI) of 37.0 to 37.9 in adult   Abdominal pain, generalized   Lactic acidosis  Acinetobacter Ilwoffii sepsis: Likely significant bacteremia per ID will need antibiotic treatment> changing antibiotic to Unasyn for sensitivity> on discharge Total 10 days of antibiotics w/ Bactrim Sepsis POA 2/2 above.  Currently stable Staff epidermidis in blood culture likely contamination:   Nausea vomiting abdominal pain Stercoral colitis Constipation:  patient had frequent ED visits and hospitalization, CT scan with constipation and stercoral colitis.  Likely some component of cyclical vomiting.  Having bowel movement constipation resolved continue with laxatives/stool soft diet home.  No abdominal complaint.   Lactic acidosis in the setting of nausea vomiting: Resolved.   UDS positive for amphetamine and opiates Type 2 diabetes mellitus stable Cbg continue outpatient home meds Recent Labs  Lab 04/26/22 0744 04/26/22 1209 04/26/22 1653 04/26/22 1955 04/27/22 0744  GLUCAP 130* 108* 119* 128* 107*    AKI resolved  Hypertension: BP stable without hypotension  Coreg and amlodipine has been held> advised to monitor BP at home and follow-up with PCP to resume her home meds Chronic  pain continue home Percocet Bipolar disorder on prazosin Abilify trazodone Hypothyroidism continue Synthroid Hypokalemia: Resolved Urine retention> do voiding trial prior to discharge continue Flomax Class I Obesity:Patient's Body mass index is 33.2 kg/m. : Will benefit with PCP follow-up, weight loss  healthy lifestyle and outpatient sleep  evaluation.  Consults: ID Subjective: Alert awake oriented resting comfortably.  Feels well enough to go home today  Discharge Exam: Vitals:   04/26/22 1953 04/27/22 0536  BP: 116/78 (!) 126/93  Pulse: 62 (!) 54  Resp: 15 19  Temp: 98.1 F (36.7 C) 98.4 F (36.9 C)  SpO2: 100% 100%   General: Pt is alert, awake, not in acute distress Cardiovascular: RRR, S1/S2 +, no rubs, no gallops Respiratory: CTA bilaterally, no wheezing, no rhonchi Abdominal: Soft, NT, ND, bowel sounds + Extremities: no edema, no cyanosis  Discharge Instructions  Discharge Instructions     Diet general   Complete by: As directed    Discharge instructions   Complete by: As directed    Please call call MD or return to ER for similar or worsening recurring problem that brought you to hospital or if any fever,nausea/vomiting,abdominal pain, uncontrolled pain, chest pain,  shortness of breath or any other alarming symptoms.  Please follow-up your doctor as instructed in a week time and call the office for appointment.  Please avoid alcohol, smoking, or any other illicit substance and maintain healthy habits including taking your regular medications as prescribed.  You were cared for by a hospitalist during your hospital stay. If you have any questions about your discharge medications or the care you received while you were in the hospital after you are discharged, you can call the unit and ask to speak with the hospitalist on call if the hospitalist that took care of you is not available.  Once you are discharged, your primary care physician will handle any further medical issues. Please note that NO REFILLS for any discharge medications will be authorized once you are discharged, as it is imperative that you return to your primary care physician (or establish a relationship with a primary care physician if you do not have one) for your aftercare needs so that they can reassess your need for medications and  monitor your lab values   Increase activity slowly   Complete by: As directed       Allergies as of 04/27/2022   No Known Allergies      Medication List     STOP taking these medications    amLODipine 10 MG tablet Commonly known as: NORVASC   carvedilol 12.5 MG tablet Commonly known as: COREG   carvedilol 6.25 MG tablet Commonly known as: COREG   HYDROcodone-acetaminophen 10-325 MG tablet Commonly known as: NORCO   lisinopril 10 MG tablet Commonly known as: ZESTRIL       TAKE these medications    acetaminophen 500 MG tablet Commonly known as: TYLENOL Take 500-1,000 mg by mouth every 6 (six) hours as needed for mild pain or headache. What changed: Another medication with the same name was changed. Make sure you understand how and when to take each.   acetaminophen 325 MG tablet Commonly known as: TYLENOL Take 2 tablets (650 mg total) by mouth every 6 (six) hours as needed for mild pain, fever or headache. What changed: reasons to take this   ARIPiprazole 5 MG tablet Commonly known as: ABILIFY Take 1 tablet (5 mg total) by mouth daily.   atorvastatin 10 MG tablet Commonly known as:  LIPITOR Take 10 mg by mouth every evening.   FLUoxetine 40 MG capsule Commonly known as: PROZAC Take 1 capsule (40 mg total) by mouth daily.   lactulose 10 GM/15ML solution Commonly known as: CHRONULAC Take 30 mLs (20 g total) by mouth daily as needed for mild constipation.   levothyroxine 50 MCG tablet Commonly known as: SYNTHROID Take 50 mcg by mouth daily before breakfast.   lidocaine 5 % ointment Commonly known as: XYLOCAINE Apply 1 Application topically daily as needed for mild pain.   metFORMIN 500 MG tablet Commonly known as: GLUCOPHAGE Take 500 mg by mouth 2 (two) times daily with a meal.   metoCLOPramide 5 MG tablet Commonly known as: REGLAN Take 1 tablet (5 mg total) by mouth 3 (three) times daily before meals for 5 days.   multivitamin Tabs tablet Take 1  tablet by mouth at bedtime.   omeprazole 20 MG capsule Commonly known as: PRILOSEC Take 20 mg by mouth daily as needed (only if Pantoprazole is not available).   ondansetron 8 MG disintegrating tablet Commonly known as: ZOFRAN-ODT Take 1 tablet (8 mg total) by mouth every 8 (eight) hours as needed for nausea or vomiting.   ondansetron 8 MG tablet Commonly known as: ZOFRAN Take 8 mg by mouth every 8 (eight) hours as needed for nausea or vomiting.   oxyCODONE-acetaminophen 7.5-325 MG tablet Commonly known as: PERCOCET Take 1 tablet by mouth 4 (four) times daily.   pantoprazole 40 MG tablet Commonly known as: Protonix Take 1 tablet (40 mg total) by mouth daily. What changed: when to take this   polyethylene glycol 17 g packet Commonly known as: MIRALAX / GLYCOLAX Take 17 g by mouth 2 (two) times daily for 7 days.   prazosin 2 MG capsule Commonly known as: MINIPRESS Take 1 capsule (2 mg total) by mouth at bedtime.   promethazine 25 MG tablet Commonly known as: PHENERGAN Take 25 mg by mouth every 6 (six) hours as needed for nausea or vomiting.   promethazine 25 MG suppository Commonly known as: PHENERGAN Place 1 suppository (25 mg total) rectally every 6 (six) hours as needed for nausea or vomiting.   sulfamethoxazole-trimethoprim 800-160 MG tablet Commonly known as: BACTRIM DS Take 1 tablet by mouth 2 (two) times daily for 9 days.   tamsulosin 0.4 MG Caps capsule Commonly known as: FLOMAX Take 1 capsule (0.4 mg total) by mouth daily after breakfast. Start taking on: April 28, 2022   tiZANidine 4 MG tablet Commonly known as: ZANAFLEX Take 3 tablets (12 mg total) by mouth every 8 (eight) hours as needed for up to 12 doses for muscle spasms. What changed:  when to take this reasons to take this   traZODone 50 MG tablet Commonly known as: DESYREL Take 3 tablets (150 mg total) by mouth at bedtime.        Follow-up Information     [facility]  Follow up in 1 week(s).   Contact information: [place] [phone]               No Known Allergies  The results of significant diagnostics from this hospitalization (including imaging, microbiology, ancillary and laboratory) are listed below for reference.    Microbiology: Recent Results (from the past 240 hour(s))  Culture, blood (routine x 2)     Status: Abnormal   Collection Time: 04/23/22 10:47 AM   Specimen: BLOOD RIGHT ARM  Result Value Ref Range Status  Specimen Description   Final    BLOOD RIGHT ARM BOTTLES DRAWN AEROBIC AND ANAEROBIC Performed at [facility] [address], [place], [place] [address]    Special Requests   Final    Blood Culture adequate volume Performed at [facility] [address] [place], [place] [address]    Culture  Setup Time   Final    GRAM POSITIVE COCCI AEROBIC BOTTLE ONLY Organism ID to follow CRITICAL RESULT CALLED TO, READ BACK BY AND VERIFIED WITH: Christean Grief PHARMD, AT 8841 04/24/22 D. VANHOOK    Culture (A)  Final    STAPHYLOCOCCUS EPIDERMIDIS THE SIGNIFICANCE OF ISOLATING THIS ORGANISM FROM A SINGLE SET OF BLOOD CULTURES WHEN MULTIPLE SETS ARE DRAWN IS UNCERTAIN. PLEASE NOTIFY THE MICROBIOLOGY DEPARTMENT WITHIN ONE WEEK IF SPECIATION AND SENSITIVITIES ARE REQUIRED. ACINETOBACTER LWOFFII CRITICAL RESULT CALLED TO, READ BACK BY AND VERIFIED WITH: T. GREEN PHARMD, AT 1201 04/25/22 Rush Landmark Performed at [facility], [place] [address] [place] [address], [place], [place] [address]    Report Status 04/26/2022 FINAL  Final   Organism ID, Bacteria ACINETOBACTER LWOFFII  Final      Susceptibility   Acinetobacter lwoffii - MIC*    CEFTAZIDIME <=1 SENSITIVE Sensitive     CIPROFLOXACIN <=0.25 SENSITIVE Sensitive     GENTAMICIN <=1 SENSITIVE Sensitive     IMIPENEM <=0.25 SENSITIVE Sensitive     TRIMETH/SULFA <=20 SENSITIVE Sensitive     AMPICILLIN/SULBACTAM <=2  SENSITIVE Sensitive     * ACINETOBACTER LWOFFII  Culture, blood (routine x 2)     Status: None (Preliminary result)   Collection Time: 04/23/22 10:47 AM   Specimen: BLOOD LEFT ARM  Result Value Ref Range Status   Specimen Description   Final    BLOOD LEFT ARM BOTTLES DRAWN AEROBIC AND ANAEROBIC Performed at [facility] [address], [place], [place] [address]    Special Requests   Final    Blood Culture adequate volume Performed at [facility] [address] [place], [place] [address]    Culture   Final    NO GROWTH 3 DAYS Performed at [facility], [place] [address], [place], [place] [address]    Report Status PENDING  Incomplete  Blood Culture ID Panel (Reflexed)     Status: Abnormal   Collection Time: 04/23/22 10:47 AM  Result Value Ref Range Status   Enterococcus faecalis NOT DETECTED NOT DETECTED Final   Enterococcus Faecium NOT DETECTED NOT DETECTED Final   Listeria monocytogenes NOT DETECTED NOT DETECTED Final   Staphylococcus species DETECTED (A) NOT DETECTED Final    Comment: CRITICAL RESULT CALLED TO, READ BACK BY AND VERIFIED WITH: Christean Grief PHARMD, AT 2025 04/24/22 D. VANHOOK    Staphylococcus aureus (BCID) NOT DETECTED NOT DETECTED Final   Staphylococcus epidermidis DETECTED (A) NOT DETECTED Final    Comment: Methicillin (oxacillin) resistant coagulase negative staphylococcus. Possible blood culture contaminant (unless isolated from more than one blood culture draw or clinical case suggests pathogenicity). No antibiotic treatment is indicated for blood  culture contaminants. CRITICAL RESULT CALLED TO, READ BACK BY AND VERIFIED WITH: Christean Grief PHARMD, AT 4270 04/24/22 D. VANHOOK    Staphylococcus lugdunensis NOT DETECTED NOT DETECTED Final   Streptococcus species NOT DETECTED NOT DETECTED Final   Streptococcus agalactiae NOT DETECTED NOT DETECTED Final   Streptococcus pneumoniae NOT DETECTED NOT DETECTED Final    Streptococcus pyogenes NOT DETECTED NOT DETECTED Final   A.calcoaceticus-baumannii NOT DETECTED  NOT DETECTED Final   Bacteroides fragilis NOT DETECTED NOT DETECTED Final   Enterobacterales NOT DETECTED NOT DETECTED Final   Enterobacter cloacae complex NOT DETECTED NOT DETECTED Final   Escherichia coli NOT DETECTED NOT DETECTED Final   Klebsiella aerogenes NOT DETECTED NOT DETECTED Final   Klebsiella oxytoca NOT DETECTED NOT DETECTED Final   Klebsiella pneumoniae NOT DETECTED NOT DETECTED Final   Proteus species NOT DETECTED NOT DETECTED Final   Salmonella species NOT DETECTED NOT DETECTED Final   Serratia marcescens NOT DETECTED NOT DETECTED Final   Haemophilus influenzae NOT DETECTED NOT DETECTED Final   Neisseria meningitidis NOT DETECTED NOT DETECTED Final   Pseudomonas aeruginosa NOT DETECTED NOT DETECTED Final   Stenotrophomonas maltophilia NOT DETECTED NOT DETECTED Final   Candida albicans NOT DETECTED NOT DETECTED Final   Candida auris NOT DETECTED NOT DETECTED Final   Candida glabrata NOT DETECTED NOT DETECTED Final   Candida krusei NOT DETECTED NOT DETECTED Final   Candida parapsilosis NOT DETECTED NOT DETECTED Final   Candida tropicalis NOT DETECTED NOT DETECTED Final   Cryptococcus neoformans/gattii NOT DETECTED NOT DETECTED Final   Methicillin resistance mecA/C DETECTED (A) NOT DETECTED Final    Comment: CRITICAL RESULT CALLED TO, READ BACK BY AND VERIFIED WITHOtila Back, AT 7829 04/24/22 Rush Landmark Performed at [facility], [address], [place], [place] [address]   Urine Culture     Status: None   Collection Time: 04/24/22  5:12 AM   Specimen: Urine, Clean Catch  Result Value Ref Range Status   Specimen Description   Final    URINE, CLEAN CATCH Performed at [facility], [place] [address] [place], [place] [address]    Special Requests   Final    NONE Performed at [facility], [place] [address] [place], [place]  [address]    Culture   Final    NO GROWTH Performed at [facility], [place] [address] [place], [place] [address]    Report Status 04/25/2022 FINAL  Final  Resp panel by RT-PCR (RSV, Flu A&B, Covid) Anterior Nasal Swab     Status: None   Collection Time: 04/24/22  8:40 AM   Specimen: Anterior Nasal Swab  Result Value Ref Range Status   SARS Coronavirus 2 by RT PCR NEGATIVE NEGATIVE Final    Comment: (NOTE) SARS-CoV-2 target nucleic acids are NOT DETECTED.  The SARS-CoV-2 RNA is generally detectable in upper respiratory specimens during the acute phase of infection. The lowest concentration of SARS-CoV-2 viral copies this assay can detect is 138 copies/mL. A negative result does not preclude SARS-Cov-2 infection and should not be used as the sole basis for treatment or other patient management decisions. A negative result may occur with  improper specimen collection/handling, submission of specimen other than nasopharyngeal swab, presence of viral mutation(s) within the areas targeted by this assay, and inadequate number of viral copies(<138 copies/mL). A negative result must be combined with clinical observations, patient history, and epidemiological information. The expected result is Negative.  Fact Sheet for Patients:  EntrepreneurPulse.com.au  Fact Sheet for Healthcare Providers:  IncredibleEmployment.be  This test is no t yet approved or cleared by the [place] FDA and  has been authorized for detection and/or diagnosis of SARS-CoV-2 by FDA under an Emergency Use Authorization (EUA). This EUA will remain  in effect (meaning this test can be used) for the duration of the COVID-19 declaration under Section 564(b)(1) of the Act, 21 U.S.C.section  360bbb-3(b)(1), unless the authorization is terminated  or revoked sooner.       Influenza A by PCR NEGATIVE NEGATIVE Final   Influenza B by PCR NEGATIVE NEGATIVE Final    Comment:  (NOTE) The Xpert Xpress SARS-CoV-2/FLU/RSV plus assay is intended as an aid in the diagnosis of influenza from Nasopharyngeal swab specimens and should not be used as a sole basis for treatment. Nasal washings and aspirates are unacceptable for Xpert Xpress SARS-CoV-2/FLU/RSV testing.  Fact Sheet for Patients: EntrepreneurPulse.com.au  Fact Sheet for Healthcare Providers: IncredibleEmployment.be  This test is not yet approved or cleared by the [place] FDA and has been authorized for detection and/or diagnosis of SARS-CoV-2 by FDA under an Emergency Use Authorization (EUA). This EUA will remain in effect (meaning this test can be used) for the duration of the COVID-19 declaration under Section 564(b)(1) of the Act, 21 U.S.C. section 360bbb-3(b)(1), unless the authorization is terminated or revoked.     Resp Syncytial Virus by PCR NEGATIVE NEGATIVE Final    Comment: (NOTE) Fact Sheet for Patients: EntrepreneurPulse.com.au  Fact Sheet for Healthcare Providers: IncredibleEmployment.be  This test is not yet approved or cleared by the [place] FDA and has been authorized for detection and/or diagnosis of SARS-CoV-2 by FDA under an Emergency Use Authorization (EUA). This EUA will remain in effect (meaning this test can be used) for the duration of the COVID-19 declaration under Section 564(b)(1) of the Act, 21 U.S.C. section 360bbb-3(b)(1), unless the authorization is terminated or revoked.  Performed at [facility], [place] [address], [place], [place] [address]   MRSA Next Gen by PCR, Nasal     Status: Abnormal   Collection Time: 04/25/22  9:25 PM   Specimen: Nasal Mucosa; Nasal Swab  Result Value Ref Range Status   MRSA by PCR Next Gen DETECTED (A) NOT DETECTED Final    Comment: (NOTE) The GeneXpert MRSA Assay (FDA approved for NASAL specimens only), is one component of a  comprehensive MRSA colonization surveillance program. It is not intended to diagnose MRSA infection nor to guide or monitor treatment for MRSA infections. Test performance is not FDA approved in patients less than 58 years old. Performed at [facility], [place] [address] [place], [place] [address]     Procedures/Studies: CT ABDOMEN PELVIS W CONTRAST  Result Date: 04/23/2022 CLINICAL DATA:  Abdominal pain, acute nonlocalized. EXAM: CT ABDOMEN AND PELVIS WITH CONTRAST TECHNIQUE: Multidetector CT imaging of the abdomen and pelvis was performed using the standard protocol following bolus administration of intravenous contrast. RADIATION DOSE REDUCTION: This exam was performed according to the departmental dose-optimization program which includes automated exposure control, adjustment of the mA and/or kV according to patient size and/or use of iterative reconstruction technique. CONTRAST:  155m OMNIPAQUE IOHEXOL 300 MG/ML  SOLN COMPARISON:  Multiple priors including CT March 21, 2022. FINDINGS: Lower chest: No acute abnormality. Hepatobiliary: Diffuse hepatic steatosis. Gallbladder is unremarkable. No biliary ductal dilation. Pancreas: No pancreatic ductal dilation or evidence of acute inflammation. Spleen: No splenomegaly. Adrenals/Urinary Tract: Bilateral adrenal glands appear normal. No hydronephrosis. Kidneys demonstrate symmetric enhancement and excretion of contrast material. Urinary bladder is unremarkable for degree of distension. Stomach/Bowel: Stomach is unremarkable for degree of distension. No pathologic dilation of small or large bowel. The appendix and terminal ileum appear normal. Moderate volume of stool in the rectum with rectal wall thickening and perirectal stranding. Vascular/Lymphatic: Aortic atherosclerosis. No pathologically enlarged abdominal or pelvic lymph nodes. Reproductive: Uterus and bilateral adnexa are unremarkable.  Other: No significant abdominopelvic free  fluid. No pneumoperitoneum. Musculoskeletal: Similar T11 and L3 superior endplate irregularity associated with a Schmorl's node. No acute osseous abnormality. IMPRESSION: 1. Moderate volume of stool in the rectum with rectal wall thickening and perirectal stranding, suggestive of stercoral colitis. 2. Diffuse hepatic steatosis. 3.  Aortic Atherosclerosis (ICD10-I70.0). Electronically Signed   By: Dahlia Bailiff M.D.   On: 04/23/2022 12:36   DG Chest Port 1 View  Result Date: 04/23/2022 CLINICAL DATA:  Fever. EXAM: PORTABLE CHEST 1 VIEW COMPARISON:  03/23/2022 and prior studies FINDINGS: This is a low volume study with mild LEFT basilar atelectasis again noted. Cardiomediastinal silhouette is unchanged. There is no evidence of focal airspace disease, pulmonary edema, suspicious pulmonary nodule/mass, pleural effusion, or pneumothorax. No acute bony abnormalities are identified. IMPRESSION: Low volume study with mild LEFT basilar atelectasis again noted. Electronically Signed   By: Margarette Canada M.D.   On: 04/23/2022 11:25    Labs: BNP (last 3 results) No results for input(s): "BNP" in the last 8760 hours. Basic Metabolic Panel: Recent Labs  Lab 04/23/22 1522 04/24/22 0512 04/25/22 0547 04/26/22 0528 04/27/22 0516  NA 141 143 141 139 139  K 3.8 3.2* 4.4 4.4 4.5  CL 106 106 110 113* 112*  CO2 20* 23 22 21* 21*  GLUCOSE 133* 124* 101* 122* 117*  BUN 7 6 <5* 7 7  CREATININE 0.88 0.78 0.62 0.61 0.66  CALCIUM 7.9* 8.7* 7.7* 8.2* 8.1*  MG  --  1.6*  --   --   --    Liver Function Tests: Recent Labs  Lab 04/23/22 1043  AST 54*  ALT 40  ALKPHOS 106  BILITOT 0.8  PROT 7.9  ALBUMIN 3.6   Recent Labs  Lab 04/23/22 1043  LIPASE 26   No results for input(s): "AMMONIA" in the last 168 hours. CBC: Recent Labs  Lab 04/23/22 1043 04/25/22 0547 04/26/22 0528 04/27/22 0516  WBC 16.7* 10.3 9.0 8.4  NEUTROABS 13.4*  --   --   --   HGB 15.6* 11.1* 11.7* 12.2  HCT 48.6* 35.1* 37.6 39.6   MCV 93.3 97.2 98.2 96.8  PLT 465* 215 206 210   Cardiac Enzymes: No results for input(s): "CKTOTAL", "CKMB", "CKMBINDEX", "TROPONINI" in the last 168 hours. BNP: Invalid input(s): "POCBNP" CBG: Recent Labs  Lab 04/26/22 0744 04/26/22 1209 04/26/22 1653 04/26/22 1955 04/27/22 0744  GLUCAP 130* 108* 119* 128* 107*   D-Dimer No results for input(s): "DDIMER" in the last 72 hours. Hgb A1c No results for input(s): "HGBA1C" in the last 72 hours. Lipid Profile No results for input(s): "CHOL", "HDL", "LDLCALC", "TRIG", "CHOLHDL", "LDLDIRECT" in the last 72 hours. Thyroid function studies Recent Labs    04/25/22 0547  TSH 0.438   Anemia work up No results for input(s): "VITAMINB12", "FOLATE", "FERRITIN", "TIBC", "IRON", "RETICCTPCT" in the last 72 hours. Urinalysis    Component Value Date/Time   COLORURINE YELLOW 04/24/2022 0512   APPEARANCEUR CLEAR 04/24/2022 0512   LABSPEC 1.026 04/24/2022 0512   PHURINE 5.0 04/24/2022 0512   GLUCOSEU NEGATIVE 04/24/2022 0512   HGBUR NEGATIVE 04/24/2022 0512   BILIRUBINUR NEGATIVE 04/24/2022 0512   KETONESUR 20 (A) 04/24/2022 0512   PROTEINUR NEGATIVE 04/24/2022 0512   UROBILINOGEN 0.2 01/19/2015 0302   NITRITE NEGATIVE 04/24/2022 0512   LEUKOCYTESUR TRACE (A) 04/24/2022 0512   Sepsis Labs Recent Labs  Lab 04/23/22 1043 04/25/22 0547 04/26/22 0528 04/27/22 0516  WBC 16.7* 10.3 9.0 8.4   Microbiology Recent  Results (from the past 240 hour(s))  Culture, blood (routine x 2)     Status: Abnormal   Collection Time: 04/23/22 10:47 AM   Specimen: BLOOD RIGHT ARM  Result Value Ref Range Status   Specimen Description   Final    BLOOD RIGHT ARM BOTTLES DRAWN AEROBIC AND ANAEROBIC Performed at [facility], [place] [address] [place], [place] [address]    Special Requests   Final    Blood Culture adequate volume Performed at [place] [address] [place], [place] [address]    Culture   Setup Time   Final    GRAM POSITIVE COCCI AEROBIC BOTTLE ONLY Organism ID to follow CRITICAL RESULT CALLED TO, READ BACK BY AND VERIFIED WITH: Christean Grief PHARMD, AT 1696 04/24/22 D. VANHOOK    Culture (A)  Final    STAPHYLOCOCCUS EPIDERMIDIS THE SIGNIFICANCE OF ISOLATING THIS ORGANISM FROM A SINGLE SET OF BLOOD CULTURES WHEN MULTIPLE SETS ARE DRAWN IS UNCERTAIN. PLEASE NOTIFY THE MICROBIOLOGY DEPARTMENT WITHIN ONE WEEK IF SPECIATION AND SENSITIVITIES ARE REQUIRED. ACINETOBACTER LWOFFII CRITICAL RESULT CALLED TO, READ BACK BY AND VERIFIED WITH: T. GREEN PHARMD, AT 1201 04/25/22 Rush Landmark Performed at [facility], [place] [address], [place], [place] [address]    Report Status 04/26/2022 FINAL  Final   Organism ID, Bacteria ACINETOBACTER LWOFFII  Final      Susceptibility   Acinetobacter lwoffii - MIC*    CEFTAZIDIME <=1 SENSITIVE Sensitive     CIPROFLOXACIN <=0.25 SENSITIVE Sensitive     GENTAMICIN <=1 SENSITIVE Sensitive     IMIPENEM <=0.25 SENSITIVE Sensitive     TRIMETH/SULFA <=20 SENSITIVE Sensitive     AMPICILLIN/SULBACTAM <=2 SENSITIVE Sensitive     * ACINETOBACTER LWOFFII  Culture, blood (routine x 2)     Status: None (Preliminary result)   Collection Time: 04/23/22 10:47 AM   Specimen: BLOOD LEFT ARM  Result Value Ref Range Status   Specimen Description   Final    BLOOD LEFT ARM BOTTLES DRAWN AEROBIC AND ANAEROBIC Performed at [place] [address] [place], [place] [address]    Special Requests   Final    Blood Culture adequate volume Performed at [place] [address], [place], [place] [address]    Culture   Final    NO GROWTH 3 DAYS Performed at [facility], [place] [address], [place], [place] [address]    Report Status PENDING  Incomplete  Blood Culture ID Panel (Reflexed)     Status: Abnormal   Collection Time: 04/23/22 10:47 AM  Result Value Ref Range Status   Enterococcus faecalis NOT DETECTED NOT DETECTED  Final   Enterococcus Faecium NOT DETECTED NOT DETECTED Final   Listeria monocytogenes NOT DETECTED NOT DETECTED Final   Staphylococcus species DETECTED (A) NOT DETECTED Final    Comment: CRITICAL RESULT CALLED TO, READ BACK BY AND VERIFIED WITH: Christean Grief PHARMD, AT 2423 04/24/22 D. VANHOOK    Staphylococcus aureus (BCID) NOT DETECTED NOT DETECTED Final   Staphylococcus epidermidis DETECTED (A) NOT DETECTED Final    Comment: Methicillin (oxacillin) resistant coagulase negative staphylococcus. Possible blood culture contaminant (unless isolated from more than one blood culture draw or clinical case suggests pathogenicity). No antibiotic treatment is indicated for blood  culture contaminants. CRITICAL RESULT CALLED TO, READ BACK BY AND VERIFIED WITH: Christean Grief PHARMD, AT 5361 04/24/22 D. VANHOOK    Staphylococcus lugdunensis NOT DETECTED NOT DETECTED Final  Streptococcus species NOT DETECTED NOT DETECTED Final   Streptococcus agalactiae NOT DETECTED NOT DETECTED Final   Streptococcus pneumoniae NOT DETECTED NOT DETECTED Final   Streptococcus pyogenes NOT DETECTED NOT DETECTED Final   A.calcoaceticus-baumannii NOT DETECTED NOT DETECTED Final   Bacteroides fragilis NOT DETECTED NOT DETECTED Final   Enterobacterales NOT DETECTED NOT DETECTED Final   Enterobacter cloacae complex NOT DETECTED NOT DETECTED Final   Escherichia coli NOT DETECTED NOT DETECTED Final   Klebsiella aerogenes NOT DETECTED NOT DETECTED Final   Klebsiella oxytoca NOT DETECTED NOT DETECTED Final   Klebsiella pneumoniae NOT DETECTED NOT DETECTED Final   Proteus species NOT DETECTED NOT DETECTED Final   Salmonella species NOT DETECTED NOT DETECTED Final   Serratia marcescens NOT DETECTED NOT DETECTED Final   Haemophilus influenzae NOT DETECTED NOT DETECTED Final   Neisseria meningitidis NOT DETECTED NOT DETECTED Final   Pseudomonas aeruginosa NOT DETECTED NOT DETECTED Final   Stenotrophomonas maltophilia NOT DETECTED NOT  DETECTED Final   Candida albicans NOT DETECTED NOT DETECTED Final   Candida auris NOT DETECTED NOT DETECTED Final   Candida glabrata NOT DETECTED NOT DETECTED Final   Candida krusei NOT DETECTED NOT DETECTED Final   Candida parapsilosis NOT DETECTED NOT DETECTED Final   Candida tropicalis NOT DETECTED NOT DETECTED Final   Cryptococcus neoformans/gattii NOT DETECTED NOT DETECTED Final   Methicillin resistance mecA/C DETECTED (A) NOT DETECTED Final    Comment: CRITICAL RESULT CALLED TO, READ BACK BY AND VERIFIED WITHOtila Back, AT 1610 04/24/22 Rush Landmark Performed at [facility], [address], [place], [place] [address]   Urine Culture     Status: None   Collection Time: 04/24/22  5:12 AM   Specimen: Urine, Clean Catch  Result Value Ref Range Status   Specimen Description   Final    URINE, CLEAN CATCH Performed at [facility], [place] [address] [place], [place] [address]    Special Requests   Final    NONE Performed at [facility], [place] [address], [place], [place] [address]    Culture   Final    NO GROWTH Performed at [facility], [place] [address] [place], [place] [address]    Report Status 04/25/2022 FINAL  Final  Resp panel by RT-PCR (RSV, Flu A&B, Covid) Anterior Nasal Swab     Status: None   Collection Time: 04/24/22  8:40 AM   Specimen: Anterior Nasal Swab  Result Value Ref Range Status   SARS Coronavirus 2 by RT PCR NEGATIVE NEGATIVE Final    Comment: (NOTE) SARS-CoV-2 target nucleic acids are NOT DETECTED.  The SARS-CoV-2 RNA is generally detectable in upper respiratory specimens during the acute phase of infection. The lowest concentration of SARS-CoV-2 viral copies this assay can detect is 138 copies/mL. A negative result does not preclude SARS-Cov-2 infection and should not be used as the sole basis for treatment or other patient management decisions. A negative result may occur with  improper specimen  collection/handling, submission of specimen other than nasopharyngeal swab, presence of viral mutation(s) within the areas targeted by this assay, and inadequate number of viral copies(<138 copies/mL). A negative result must be combined with clinical observations, patient history, and epidemiological information. The expected result is Negative.  Fact Sheet for Patients:  EntrepreneurPulse.com.au  Fact Sheet for Healthcare Providers:  IncredibleEmployment.be  This test is no t yet approved or cleared by the [place] FDA and  has been authorized for detection  and/or diagnosis of SARS-CoV-2 by FDA under an Emergency Use Authorization (EUA). This EUA will remain  in effect (meaning this test can be used) for the duration of the COVID-19 declaration under Section 564(b)(1) of the Act, 21 U.S.C.section 360bbb-3(b)(1), unless the authorization is terminated  or revoked sooner.       Influenza A by PCR NEGATIVE NEGATIVE Final   Influenza B by PCR NEGATIVE NEGATIVE Final    Comment: (NOTE) The Xpert Xpress SARS-CoV-2/FLU/RSV plus assay is intended as an aid in the diagnosis of influenza from Nasopharyngeal swab specimens and should not be used as a sole basis for treatment. Nasal washings and aspirates are unacceptable for Xpert Xpress SARS-CoV-2/FLU/RSV testing.  Fact Sheet for Patients: EntrepreneurPulse.com.au  Fact Sheet for Healthcare Providers: IncredibleEmployment.be  This test is not yet approved or cleared by the [place] FDA and has been authorized for detection and/or diagnosis of SARS-CoV-2 by FDA under an Emergency Use Authorization (EUA). This EUA will remain in effect (meaning this test can be used) for the duration of the COVID-19 declaration under Section 564(b)(1) of the Act, 21 U.S.C. section 360bbb-3(b)(1), unless the authorization is terminated or revoked.     Resp Syncytial  Virus by PCR NEGATIVE NEGATIVE Final    Comment: (NOTE) Fact Sheet for Patients: EntrepreneurPulse.com.au  Fact Sheet for Healthcare Providers: IncredibleEmployment.be  This test is not yet approved or cleared by the [place] FDA and has been authorized for detection and/or diagnosis of SARS-CoV-2 by FDA under an Emergency Use Authorization (EUA). This EUA will remain in effect (meaning this test can be used) for the duration of the COVID-19 declaration under Section 564(b)(1) of the Act, 21 U.S.C. section 360bbb-3(b)(1), unless the authorization is terminated or revoked.  Performed at [facility], [place] [address] [place] [address], [place], [place] [address]   MRSA Next Gen by PCR, Nasal     Status: Abnormal   Collection Time: 04/25/22  9:25 PM   Specimen: Nasal Mucosa; Nasal Swab  Result Value Ref Range Status   MRSA by PCR Next Gen DETECTED (A) NOT DETECTED Final    Comment: (NOTE) The GeneXpert MRSA Assay (FDA approved for NASAL specimens only), is one component of a comprehensive MRSA colonization surveillance program. It is not intended to diagnose MRSA infection nor to guide or monitor treatment for MRSA infections. Test performance is not FDA approved in patients less than 36 years old. Performed at [facility], [place] [address], [place], [place] [address]      Time coordinating discharge: 25 minutes  SIGNED: Antonieta Pert, MD  [facility] 04/27/2022, 10:01 AM  If 7PM-7AM, please contact night-coverage [email or web address]

## 2022-04-29 LAB — CULTURE, BLOOD (ROUTINE X 2)
Culture: NO GROWTH
Special Requests: ADEQUATE

## 2022-05-02 NOTE — Progress Notes (Signed)
Referring Provider: Ferd Hibbs, NP Primary Care Physician:  Ferd Hibbs, NP  Chief Complaint:  Nausea and abdominal pain   IMPRESSION:  # Chronic nausea /vomiting in setting of chronic narcotics. Two recent admissions for symptoms and multiple ED evaluations. Multiple CTAP w/ contrast non-diagnostic. Only gastritis on inpatient EGD 6/23.   # Constipation with stercoral colitis on CT 04/2022   # GERD. Didn't meet criteria for Barrett's on EGD last month.    # History of iron deficiency. Non-diagnostic evaluation except for small bowel AVM on capsule. Hgb 12.1.   PLAN: Increase pantoprazole to 40 mg BID Refill Zofran Bowel purge Close office follow-up in 3-4 week Consider gastric emptying scan in the future, although unlikely to change management  HPI: [name] is a 52 y.o. female  with a history of anxiety, depression, anorexia as a teenager, PTSD, chronic pain syndrome on chronic narcotics per pain management, drug-seeking behavior, multiple urine drug screens positive for amphetamines, hypertension, cardiac arrest thought to be secondary to opiate overdose 05/2020, diabetes mellitus type 2, iron deficiency anemia (nondiagnostic EGD/colonoscopy and capsule endoscopy in 9030), cyclic vomiting syndrome, GERD, Barrett's esophagus per EGD 12/2018 and tubular adenomatous colon polyps. She had undergone EGD in June 2023 which showed a small hiatal hernia, did not meet criteria for Barrett's, and patchy gastritis.   She has had multiple ER visits and hospitalizations for intractable nausea, vomiting, and abdominal pain. She has had multiple CT scans, including most recently CT 04/23/22 showed a moderate volume of stool. She was hospitalized in early January for these symptoms and SIRS/sepsis. Cultures were positive for Acinetobacter and she was treated with IV Unasyn and discharged home on a course of Bactrim.   She reports a liquid bowel movement daily. No blood or mucous. She is  using Miralax every day with some improvement. All of her symptoms have improved after her colonoscopy.  Wakes up feeling nauseated. She will treat with Zofran.   Doesn't feel nauseated again until bedtime.   No alcohol or marijuana.   No NSAIDs.   PAST GI PROCEDURES:   EGD 10/05/2021: - Z-line irregular, at the gastroesophageal junction. Some exudate - ? adherent material - Small hiatal hernia. - Gastritis. Biopsies showed chronic gastritis.   Capsule endoscopy 03/22/2019: Negative except one tiny possible AVM   Colonoscopy 03/04/2019: - One 1 mm tubular adenoma at the ileocecal valve - One 3 mm tubular adenoma in the proximal ascending colon - Patchy mild inflammation was found in the rectum. Unclear clinical significance - 7 year colonoscopy recall   EGD 12/27/2018: - Normal esophagus. Biopsies showed intestinal metaplasia with Barrett's esophagus - Small hiatal hernia.  - Gastric biopsies showed mild reactive gastropathy - Normal examined duodenum. Normal biopsies.     Past Medical History:  Diagnosis Date   Anxiety    Arthritis    "joints ache all over" (10/15/2014)   Barrett's esophagus    Bulging lumbar disc    Chronic lower back pain    DDD (degenerative disc disease), cervical    Depression    DM (diabetes mellitus) ([place])    Drug-seeking behavior    Headache    "weekly" (10/15/2014)   Hyperlipemia    Hypertension    PTSD (post-traumatic stress disorder)    Skin cancer    "had them cut off my arms; don't know what kind"    Past Surgical History:  Procedure Laterality Date   ABLATION ON ENDOMETRIOSIS  2008   BIOPSY  12/27/2018  Procedure: BIOPSY;  Surgeon: Thornton Park, MD;  Location: [facility];  Service: Gastroenterology;;   BIOPSY  10/05/2021   Procedure: BIOPSY;  Surgeon: Gatha Mayer, MD;  Location: [facility] ENDOSCOPY;  Service: Gastroenterology;;   ESOPHAGOGASTRODUODENOSCOPY (EGD) WITH PROPOFOL N/A 12/27/2018   Procedure:  ESOPHAGOGASTRODUODENOSCOPY (EGD) WITH PROPOFOL;  Surgeon: Thornton Park, MD;  Location: [facility] ENDOSCOPY;  Service: Gastroenterology;  Laterality: N/A;   ESOPHAGOGASTRODUODENOSCOPY (EGD) WITH PROPOFOL N/A 10/05/2021   Procedure: ESOPHAGOGASTRODUODENOSCOPY (EGD) WITH PROPOFOL;  Surgeon: Gatha Mayer, MD;  Location: [facility];  Service: Gastroenterology;  Laterality: N/A;   HEMORRHOID SURGERY  ~ 2002   IR FLUORO GUIDE CV LINE RIGHT  06/14/2020   IR REMOVAL TUN CV CATH W/O FL  06/23/2020   IR US GUIDE VASC ACCESS RIGHT  06/14/2020   ORIF ANKLE FRACTURE Right 03/28/2020   Procedure: OPEN REDUCTION INTERNAL FIXATION (ORIF) RIGHT BIMALLEOLAR ANKLE FRACTURE;  Surgeon: Marchia Bond, MD;  Location: [place];  Service: Orthopedics;  Laterality: Right;    No current facility-administered medications for this visit.   No current outpatient medications on file.   Facility-Administered Medications Ordered in Other Visits  Medication Dose Route Frequency Provider Last Rate Last Admin   ARIPiprazole (ABILIFY) tablet 5 mg  5 mg Oral Daily Crosley, Debby, MD   5 mg at 05/23/22 1046   ceFEPIme (MAXIPIME) 2 g in sodium chloride 0.9 % 100 mL IVPB  2 g Intravenous Q8H Madueme, Elvira C, RPH 200 mL/hr at 05/23/22 1330 2 g at 05/23/22 1330   Chlorhexidine Gluconate Cloth 2 % PADS 6 each  6 each Topical Daily Rai, Ripudeep K, MD   6 each at 05/23/22 1046   dextrose 5 % and 0.45 % NaCl with KCl 40 mEq/L infusion   Intravenous Continuous Rai, Ripudeep K, MD 100 mL/hr at 05/23/22 1157 New Bag at 05/23/22 1157   fentaNYL (SUBLIMAZE) injection 25 mcg  25 mcg Intravenous Q4H PRN Rai, Ripudeep K, MD   25 mcg at 05/23/22 1154   hydrALAZINE (APRESOLINE) injection 10 mg  10 mg Intravenous Q6H PRN Rai, Ripudeep K, MD   10 mg at 05/23/22 1346   insulin aspart (novoLOG) injection 0-9 Units  0-9 Units Subcutaneous TID WC Rai, Ripudeep K, MD   1 Units at 05/23/22 1748   lactated ringers bolus 1,000 mL  1,000 mL  Intravenous Once Rai, Ripudeep K, MD       levothyroxine (SYNTHROID) tablet 50 mcg  50 mcg Oral Q0600 Crosley, Debby, MD   50 mcg at 05/23/22 0552   LORazepam (ATIVAN) injection 0.5 mg  0.5 mg Intravenous Q4H PRN Rai, Ripudeep K, MD   0.5 mg at 05/22/22 1813   metoprolol tartrate (LOPRESSOR) injection 5 mg  5 mg Intravenous Q6H Rai, Ripudeep K, MD   5 mg at 05/23/22 1154   multivitamin (RENA-VIT) tablet 1 tablet  1 tablet Oral QHS Shawna Clamp, MD       oxyCODONE-acetaminophen (PERCOCET) 7.5-325 MG per tablet 1 tablet  1 tablet Oral QID Crosley, Debby, MD   1 tablet at 05/23/22 1329   pantoprazole (PROTONIX) injection 40 mg  40 mg Intravenous Q12H Rai, Ripudeep K, MD   40 mg at 05/23/22 1046   pneumococcal 20-valent conjugate vaccine (PREVNAR 20) injection 0.5 mL  0.5 mL Intramuscular Tomorrow-1000 Rai, Ripudeep K, MD       prazosin (MINIPRESS) capsule 2 mg  2 mg Oral QHS Crosley, Debby, MD   2 mg at 05/22/22 2114  prochlorperazine (COMPAZINE) injection 5 mg  5 mg Intravenous Q4H PRN Quintella Baton, MD   5 mg at 05/22/22 1803   sodium chloride flush (NS) 0.9 % injection 10-40 mL  10-40 mL Intracatheter Q12H Rai, Ripudeep K, MD   10 mL at 05/22/22 2115   sodium chloride flush (NS) 0.9 % injection 10-40 mL  10-40 mL Intracatheter PRN Rai, Ripudeep K, MD       sodium chloride flush (NS) 0.9 % injection 10-40 mL  10-40 mL Intracatheter PRN Rai, Ripudeep K, MD       tiZANidine (ZANAFLEX) tablet 8 mg  8 mg Oral Q8H PRN Esterwood, Amy S, PA-C   8 mg at 05/23/22 1329   traZODone (DESYREL) tablet 150 mg  150 mg Oral QHS Crosley, Debby, MD   150 mg at 05/22/22 2127   vancomycin (VANCOREADY) IVPB 1500 mg/300 mL  1,500 mg Intravenous Q24H Tawnya Crook, RPH 150 mL/hr at 05/23/22 1558 1,500 mg at 05/23/22 1558    Allergies as of 05/03/2022   (No Known Allergies)    Family History  Problem Relation Age of Onset   Breast cancer Mother    Diabetes Mother    Breast cancer Maternal Grandmother     Breast cancer Paternal Grandmother    Colon polyps Paternal Grandmother    Colon cancer Neg Hx    Esophageal cancer Neg Hx    Rectal cancer Neg Hx    Stomach cancer Neg Hx       Physical Exam: General:   Alert,  well-nourished, pleasant and cooperative in NAD Head:  Normocephalic and atraumatic. Eyes:  Sclera clear, no icterus.   Conjunctiva pink. Ears:  Normal auditory acuity. Nose:  No deformity, discharge,  or lesions. Mouth:  No deformity or lesions.   Neck:  Supple; no masses or thyromegaly. Lungs:  Clear throughout to auscultation.   No wheezes. Heart:  Regular rate and rhythm; no murmurs. Abdomen:  Soft, nontender, nondistended, normal bowel sounds, no rebound or guarding. No hepatosplenomegaly.   Rectal:  Deferred  Msk:  Symmetrical. No boney deformities LAD: No inguinal or umbilical LAD Extremities:  No clubbing or edema. Neurologic:  Alert and  oriented x4;  grossly nonfocal Skin:  Intact without significant lesions or rashes. Psych:  Alert and cooperative. Normal mood and affect.    Ivin Rosenbloom L. Tarri Glenn, MD, MPH 05/23/2022, 8:55 PM

## 2022-05-03 ENCOUNTER — Ambulatory Visit: Payer: Medicare Other | Admitting: Gastroenterology

## 2022-05-03 ENCOUNTER — Encounter: Payer: Self-pay | Admitting: Gastroenterology

## 2022-05-03 VITALS — BP 136/78 | HR 94 | Ht 64.5 in | Wt 191.0 lb

## 2022-05-03 DIAGNOSIS — R112 Nausea with vomiting, unspecified: Secondary | ICD-10-CM

## 2022-05-03 DIAGNOSIS — R110 Nausea: Secondary | ICD-10-CM

## 2022-05-03 MED ORDER — ONDANSETRON 8 MG PO TBDP: 8.0000 mg | ORAL_TABLET | Freq: Three times a day (TID) | ORAL | 0 refills | Status: DC | PRN

## 2022-05-03 MED ORDER — PANTOPRAZOLE SODIUM 40 MG PO TBEC: 40.0000 mg | DELAYED_RELEASE_TABLET | Freq: Two times a day (BID) | ORAL | 3 refills | Status: DC

## 2022-05-03 NOTE — Patient Instructions (Addendum)
If you are age 52 or younger, your body mass index should be between 19-25. Your Body mass index is 32.28 kg/m. If this is out of the aformentioned range listed, please consider follow up with your Primary Care Provider.  ________________________________________________________  The [place] [facility] providers would like to encourage you to use [facility] to communicate with providers for non-urgent requests or questions.  Due to long hold times on the telephone, sending your provider a message by [facility] may be a faster and more efficient way to get a response.  Please allow 48 business hours for a response.  Please remember that this is for non-urgent requests.  _______________________________________________________  We have sent the following medications to your pharmacy for you to pick up at your convenience: INCREASE: pantoprazole to '40mg'$  one tablet twice daily. CONTINUE: Zofran  Dr Tarri Glenn recommends that you complete a bowel purge (to clean out your bowels). Please do the following:  Purchase a bottle of Miralax over the counter as well as a box of 5 mg dulcolax tablets. Take 4 dulcolax tablets.  Wait 1 hour.  You will then drink 6-8 capfuls of Miralax mixed in an adequate amount of water/juice/gatorade (you may choose which of these liquids to drink) over the next 2-3 hours.  You should expect results within 1 to 6 hours after completing the bowel purge.  Thank you for trusting me with your gastrointestinal care!    Thornton Park, MD, MPH

## 2022-05-20 ENCOUNTER — Other Ambulatory Visit: Payer: Self-pay

## 2022-05-20 ENCOUNTER — Inpatient Hospital Stay (HOSPITAL_COMMUNITY)
Admission: EM | Admit: 2022-05-20 | Discharge: 2022-05-24 | DRG: 641 | Disposition: A | Discharge status: Home / Self Care | Payer: Medicare Other | Attending: Family Medicine | Admitting: Family Medicine

## 2022-05-20 ENCOUNTER — Encounter (HOSPITAL_COMMUNITY): Payer: Self-pay

## 2022-05-20 DIAGNOSIS — F1910 Other psychoactive substance abuse, uncomplicated: Secondary | ICD-10-CM | POA: Diagnosis present

## 2022-05-20 DIAGNOSIS — E872 Acidosis, unspecified: Principal | ICD-10-CM | POA: Diagnosis present

## 2022-05-20 DIAGNOSIS — Z79899 Other long term (current) drug therapy: Secondary | ICD-10-CM

## 2022-05-20 DIAGNOSIS — Z6832 Body mass index (BMI) 32.0-32.9, adult: Secondary | ICD-10-CM

## 2022-05-20 DIAGNOSIS — R1115 Cyclical vomiting syndrome unrelated to migraine: Secondary | ICD-10-CM | POA: Diagnosis present

## 2022-05-20 DIAGNOSIS — E669 Obesity, unspecified: Secondary | ICD-10-CM | POA: Diagnosis present

## 2022-05-20 DIAGNOSIS — Z833 Family history of diabetes mellitus: Secondary | ICD-10-CM

## 2022-05-20 DIAGNOSIS — I10 Essential (primary) hypertension: Secondary | ICD-10-CM | POA: Diagnosis present

## 2022-05-20 DIAGNOSIS — G931 Anoxic brain damage, not elsewhere classified: Secondary | ICD-10-CM | POA: Diagnosis present

## 2022-05-20 DIAGNOSIS — S82841A Displaced bimalleolar fracture of right lower leg, initial encounter for closed fracture: Secondary | ICD-10-CM | POA: Diagnosis present

## 2022-05-20 DIAGNOSIS — R000 Tachycardia, unspecified: Secondary | ICD-10-CM | POA: Diagnosis present

## 2022-05-20 DIAGNOSIS — F4310 Post-traumatic stress disorder, unspecified: Secondary | ICD-10-CM | POA: Diagnosis present

## 2022-05-20 DIAGNOSIS — R112 Nausea with vomiting, unspecified: Principal | ICD-10-CM

## 2022-05-20 DIAGNOSIS — Z79890 Hormone replacement therapy (postmenopausal): Secondary | ICD-10-CM

## 2022-05-20 DIAGNOSIS — Z85828 Personal history of other malignant neoplasm of skin: Secondary | ICD-10-CM

## 2022-05-20 DIAGNOSIS — E860 Dehydration: Secondary | ICD-10-CM

## 2022-05-20 DIAGNOSIS — Z7984 Long term (current) use of oral hypoglycemic drugs: Secondary | ICD-10-CM

## 2022-05-20 DIAGNOSIS — G894 Chronic pain syndrome: Secondary | ICD-10-CM | POA: Diagnosis present

## 2022-05-20 DIAGNOSIS — R6510 Systemic inflammatory response syndrome (SIRS) of non-infectious origin without acute organ dysfunction: Secondary | ICD-10-CM | POA: Diagnosis present

## 2022-05-20 DIAGNOSIS — R1110 Vomiting, unspecified: Secondary | ICD-10-CM | POA: Diagnosis present

## 2022-05-20 DIAGNOSIS — E876 Hypokalemia: Secondary | ICD-10-CM | POA: Diagnosis present

## 2022-05-20 DIAGNOSIS — M5030 Other cervical disc degeneration, unspecified cervical region: Secondary | ICD-10-CM | POA: Diagnosis present

## 2022-05-20 DIAGNOSIS — I160 Hypertensive urgency: Secondary | ICD-10-CM | POA: Diagnosis present

## 2022-05-20 DIAGNOSIS — E119 Type 2 diabetes mellitus without complications: Secondary | ICD-10-CM | POA: Diagnosis present

## 2022-05-20 DIAGNOSIS — E785 Hyperlipidemia, unspecified: Secondary | ICD-10-CM | POA: Diagnosis present

## 2022-05-20 DIAGNOSIS — R1031 Right lower quadrant pain: Secondary | ICD-10-CM

## 2022-05-20 DIAGNOSIS — M5136 Other intervertebral disc degeneration, lumbar region: Secondary | ICD-10-CM | POA: Diagnosis present

## 2022-05-20 DIAGNOSIS — E039 Hypothyroidism, unspecified: Secondary | ICD-10-CM | POA: Diagnosis present

## 2022-05-20 DIAGNOSIS — F419 Anxiety disorder, unspecified: Secondary | ICD-10-CM | POA: Diagnosis present

## 2022-05-20 DIAGNOSIS — K760 Fatty (change of) liver, not elsewhere classified: Secondary | ICD-10-CM | POA: Diagnosis present

## 2022-05-20 DIAGNOSIS — R9431 Abnormal electrocardiogram [ECG] [EKG]: Secondary | ICD-10-CM | POA: Diagnosis present

## 2022-05-20 DIAGNOSIS — F319 Bipolar disorder, unspecified: Secondary | ICD-10-CM | POA: Diagnosis present

## 2022-05-20 DIAGNOSIS — Z8674 Personal history of sudden cardiac arrest: Secondary | ICD-10-CM

## 2022-05-20 HISTORY — DX: Type 2 diabetes mellitus without complications: E11.9

## 2022-05-20 LAB — CBG MONITORING, ED: Glucose-Capillary: 147 mg/dL — ABNORMAL HIGH (ref 70–99)

## 2022-05-20 MED ORDER — LACTATED RINGERS IV BOLUS
2000.0000 mL | Freq: Once | INTRAVENOUS | Status: AC
  Administered 2022-05-21: 2000 mL via INTRAVENOUS

## 2022-05-20 MED ORDER — FENTANYL CITRATE PF 50 MCG/ML IJ SOSY
100.0000 ug | PREFILLED_SYRINGE | Freq: Once | INTRAMUSCULAR | Status: AC
  Administered 2022-05-21: 100 ug via INTRAVENOUS
  Filled 2022-05-20: qty 2

## 2022-05-20 MED ORDER — METOCLOPRAMIDE HCL 5 MG/ML IJ SOLN
10.0000 mg | Freq: Once | INTRAMUSCULAR | Status: AC
  Administered 2022-05-21: 10 mg via INTRAVENOUS
  Filled 2022-05-20: qty 2

## 2022-05-20 NOTE — ED Triage Notes (Signed)
N/V/D x 6 hours.   Hx T2DM, brain injury, and cyclic vomiting.  Sts several recent admissions this year for same symptoms.

## 2022-05-20 NOTE — ED Provider Notes (Signed)
[facility] DEPT Provider Note: Georgena Spurling, MD, FACEP  CSN: [id number] MRN: [id number] ARRIVAL: 05/20/22 at 2332 ROOM: [place]  05/20/22 11:48 PM [name] is a 52 y.o. female with a history of episodic nausea, vomiting and diarrhea and frequent visits for the same.  She has had multiple workups for this with unremarkable CT scans and laboratory values being the usual findings.  She is here with nausea, vomiting and diarrhea for about the past 6 hours.  She is having right lower quadrant pain with this which she rates as an 8 out of 10.  This is usual for her episodes.  She denies marijuana use.    Past Medical History:  Diagnosis Date   Anxiety    Arthritis    "joints ache all over" (10/15/2014)   Barrett's esophagus    Bulging lumbar disc    Chronic lower back pain    DDD (degenerative disc disease), cervical    Depression    DM (diabetes mellitus) ([place])    Drug-seeking behavior    Headache    "weekly" (10/15/2014)   Hyperlipemia    Hypertension    PTSD (post-traumatic stress disorder)    Skin cancer    "had them cut off my arms; don't know what kind"    Past Surgical History:  Procedure Laterality Date   ABLATION ON ENDOMETRIOSIS  2008   BIOPSY  12/27/2018   Procedure: BIOPSY;  Surgeon: Thornton Park, MD;  Location: [facility] ENDOSCOPY;  Service: Gastroenterology;;   BIOPSY  10/05/2021   Procedure: BIOPSY;  Surgeon: Gatha Mayer, MD;  Location: [facility] ENDOSCOPY;  Service: Gastroenterology;;   ESOPHAGOGASTRODUODENOSCOPY (EGD) WITH PROPOFOL N/A 12/27/2018   Procedure: ESOPHAGOGASTRODUODENOSCOPY (EGD) WITH PROPOFOL;  Surgeon: Thornton Park, MD;  Location: [facility];  Service: Gastroenterology;  Laterality: N/A;   ESOPHAGOGASTRODUODENOSCOPY (EGD) WITH PROPOFOL N/A 10/05/2021   Procedure: ESOPHAGOGASTRODUODENOSCOPY (EGD) WITH PROPOFOL;  Surgeon: Gatha Mayer, MD;  Location: [facility];  Service:  Gastroenterology;  Laterality: N/A;   HEMORRHOID SURGERY  ~ 2002   IR FLUORO GUIDE CV LINE RIGHT  06/14/2020   IR REMOVAL TUN CV CATH W/O FL  06/23/2020   IR US GUIDE VASC ACCESS RIGHT  06/14/2020   ORIF ANKLE FRACTURE Right 03/28/2020   Procedure: OPEN REDUCTION INTERNAL FIXATION (ORIF) RIGHT BIMALLEOLAR ANKLE FRACTURE;  Surgeon: Marchia Bond, MD;  Location: [place];  Service: Orthopedics;  Laterality: Right;    Family History  Problem Relation Age of Onset   Breast cancer Mother    Diabetes Mother    Breast cancer Maternal Grandmother    Breast cancer Paternal Grandmother    Colon polyps Paternal Grandmother    Colon cancer Neg Hx    Esophageal cancer Neg Hx    Rectal cancer Neg Hx    Stomach cancer Neg Hx     Social History   Tobacco Use   Smoking status: Never   Smokeless tobacco: Never  Vaping Use   Vaping Use: Never used  Substance Use Topics   Alcohol use: Not Currently    Comment: 10/15/2014 "I've drank before; nothing regular; don't drink now cause of RX I'm on"   Drug use: No    Prior to Admission medications   Medication Sig Start Date End Date Taking? Authorizing Provider  acetaminophen (TYLENOL) 500 MG tablet Take 500-1,000 mg by mouth every 6 (six) hours as needed for mild pain or headache.  [provider]  ARIPiprazole (ABILIFY) 5 MG tablet Take 1 tablet (5 mg total) by mouth daily. 08/01/20   Jamse Arn, MD  atorvastatin (LIPITOR) 10 MG tablet Take 10 mg by mouth every evening. 08/26/20   [provider]  FLUoxetine (PROZAC) 40 MG capsule Take 1 capsule (40 mg total) by mouth daily. 07/01/20   Angiulli, Lavon Paganini, PA-C  lactulose (CHRONULAC) 10 GM/15ML solution Take 30 mLs (20 g total) by mouth daily as needed for mild constipation. 04/27/22   Antonieta Pert, MD  levothyroxine (SYNTHROID) 50 MCG tablet Take 50 mcg by mouth daily before breakfast.    [provider]  lidocaine (XYLOCAINE) 5 % ointment Apply 1  Application topically daily as needed for mild pain. 02/26/22   [provider]  metFORMIN (GLUCOPHAGE) 500 MG tablet Take 500 mg by mouth 2 (two) times daily with a meal.    [provider]  metoCLOPramide (REGLAN) 5 MG tablet Take 1 tablet (5 mg total) by mouth 3 (three) times daily before meals for 5 days. 04/27/22 05/03/22  Antonieta Pert, MD  multivitamin (RENA-VIT) TABS tablet Take 1 tablet by mouth at bedtime. 08/01/20   Jamse Arn, MD  omeprazole (PRILOSEC) 20 MG capsule Take 20 mg by mouth daily as needed (only if Pantoprazole is not available).    [provider]  ondansetron (ZOFRAN-ODT) 8 MG disintegrating tablet Take 1 tablet (8 mg total) by mouth every 8 (eight) hours as needed for nausea or vomiting. 05/03/22   Thornton Park, MD  oxyCODONE-acetaminophen (PERCOCET) 7.5-325 MG tablet Take 1 tablet by mouth 4 (four) times daily.    [provider]  pantoprazole (PROTONIX) 40 MG tablet Take 1 tablet (40 mg total) by mouth 2 (two) times daily. 05/03/22 05/03/23  Thornton Park, MD  prazosin (MINIPRESS) 2 MG capsule Take 1 capsule (2 mg total) by mouth at bedtime. 08/01/20   Jamse Arn, MD  promethazine (PHENERGAN) 25 MG suppository Place 1 suppository (25 mg total) rectally every 6 (six) hours as needed for nausea or vomiting. 12/27/21   Drenda Freeze, MD  promethazine (PHENERGAN) 25 MG tablet Take 25 mg by mouth every 6 (six) hours as needed for nausea or vomiting.    [provider]  tamsulosin (FLOMAX) 0.4 MG CAPS capsule Take 1 capsule (0.4 mg total) by mouth daily after breakfast. 04/28/22 05/28/22  Antonieta Pert, MD  tiZANidine (ZANAFLEX) 4 MG tablet Take 3 tablets (12 mg total) by mouth every 8 (eight) hours as needed for up to 12 doses for muscle spasms. 04/27/22   Antonieta Pert, MD  traZODone (DESYREL) 50 MG tablet Take 3 tablets (150 mg total) by mouth at bedtime. 09/21/21   Elodia Florence., MD    Allergies Patient has no known  allergies.   REVIEW OF SYSTEMS  Negative except as noted here or in the History of Present Illness.   PHYSICAL EXAMINATION  Initial Vital Signs Blood pressure (!) 155/108, pulse (!) 129, temperature 98.6 F (37 C), temperature source Oral, resp. rate 19, height 5' 4.5" (1.638 m), weight 86.2 kg, last menstrual period 06/03/2018, SpO2 100 %.  Examination General: Well-developed, well-nourished female in no acute distress; appearance consistent with age of record HENT: normocephalic; atraumatic Eyes: Normal appearance Neck: supple Heart: regular rate and rhythm; tachycardia Lungs: clear to auscultation bilaterally Abdomen: soft; nondistended; right lower quadrant tenderness; bowel sounds present Extremities: No deformity; full range of motion; pulses normal Neurologic: Awake, alert and oriented; motor  function intact in all extremities and symmetric; no facial droop Skin: Warm and dry Psychiatric: Flat affect   RESULTS  Summary of this visit's results, reviewed and interpreted by myself:   EKG Interpretation  Date/Time:    Ventricular Rate:    PR Interval:    QRS Duration:   QT Interval:    QTC Calculation:   R Axis:     Text Interpretation:         Laboratory Studies: Results for orders placed or performed during the hospital encounter of 05/20/22 (from the past 24 hour(s))  CBG monitoring, ED     Status: Abnormal   Collection Time: 05/20/22 11:46 PM  Result Value Ref Range   Glucose-Capillary 147 (H) 70 - 99 mg/dL   Imaging Studies: No results found.  ED COURSE and MDM  Nursing notes, initial and subsequent vitals signs, including pulse oximetry, reviewed and interpreted by myself.  Vitals:   05/20/22 2343 05/20/22 2345 05/20/22 2346 05/20/22 2347  BP: (!) 155/108     Pulse: (!) 133 (!) 129    Resp:  19    Temp:   98.6 F (37 C)   TempSrc:   Oral   SpO2: 99% 100%    Weight:    86.2 kg  Height:    5' 4.5" (1.638 m)   Medications  lactated ringers  bolus 2,000 mL (has no administration in time range)  metoCLOPramide (REGLAN) injection 10 mg (has no administration in time range)  fentaNYL (SUBLIMAZE) injection 100 mcg (has no administration in time range)      PROCEDURES  Procedures   ED DIAGNOSES  No diagnosis found.

## 2022-05-21 ENCOUNTER — Inpatient Hospital Stay: Payer: Self-pay

## 2022-05-21 ENCOUNTER — Encounter (HOSPITAL_COMMUNITY): Payer: Self-pay

## 2022-05-21 ENCOUNTER — Emergency Department (HOSPITAL_COMMUNITY): Payer: Medicare Other

## 2022-05-21 DIAGNOSIS — M5030 Other cervical disc degeneration, unspecified cervical region: Secondary | ICD-10-CM | POA: Diagnosis present

## 2022-05-21 DIAGNOSIS — G931 Anoxic brain damage, not elsewhere classified: Secondary | ICD-10-CM | POA: Diagnosis present

## 2022-05-21 DIAGNOSIS — Z833 Family history of diabetes mellitus: Secondary | ICD-10-CM | POA: Diagnosis not present

## 2022-05-21 DIAGNOSIS — E860 Dehydration: Secondary | ICD-10-CM | POA: Diagnosis present

## 2022-05-21 DIAGNOSIS — I10 Essential (primary) hypertension: Secondary | ICD-10-CM | POA: Diagnosis present

## 2022-05-21 DIAGNOSIS — S82841A Displaced bimalleolar fracture of right lower leg, initial encounter for closed fracture: Secondary | ICD-10-CM | POA: Diagnosis present

## 2022-05-21 DIAGNOSIS — E039 Hypothyroidism, unspecified: Secondary | ICD-10-CM | POA: Diagnosis present

## 2022-05-21 DIAGNOSIS — F319 Bipolar disorder, unspecified: Secondary | ICD-10-CM | POA: Diagnosis present

## 2022-05-21 DIAGNOSIS — R1011 Right upper quadrant pain: Secondary | ICD-10-CM | POA: Diagnosis not present

## 2022-05-21 DIAGNOSIS — R1115 Cyclical vomiting syndrome unrelated to migraine: Secondary | ICD-10-CM | POA: Diagnosis present

## 2022-05-21 DIAGNOSIS — E872 Acidosis, unspecified: Secondary | ICD-10-CM | POA: Diagnosis present

## 2022-05-21 DIAGNOSIS — Z7984 Long term (current) use of oral hypoglycemic drugs: Secondary | ICD-10-CM | POA: Diagnosis not present

## 2022-05-21 DIAGNOSIS — E119 Type 2 diabetes mellitus without complications: Secondary | ICD-10-CM | POA: Diagnosis present

## 2022-05-21 DIAGNOSIS — G894 Chronic pain syndrome: Secondary | ICD-10-CM | POA: Diagnosis present

## 2022-05-21 DIAGNOSIS — R9431 Abnormal electrocardiogram [ECG] [EKG]: Secondary | ICD-10-CM | POA: Diagnosis present

## 2022-05-21 DIAGNOSIS — F419 Anxiety disorder, unspecified: Secondary | ICD-10-CM | POA: Diagnosis present

## 2022-05-21 DIAGNOSIS — M5136 Other intervertebral disc degeneration, lumbar region: Secondary | ICD-10-CM | POA: Diagnosis present

## 2022-05-21 DIAGNOSIS — R6510 Systemic inflammatory response syndrome (SIRS) of non-infectious origin without acute organ dysfunction: Secondary | ICD-10-CM | POA: Diagnosis present

## 2022-05-21 DIAGNOSIS — E669 Obesity, unspecified: Secondary | ICD-10-CM | POA: Diagnosis present

## 2022-05-21 DIAGNOSIS — E876 Hypokalemia: Secondary | ICD-10-CM | POA: Diagnosis not present

## 2022-05-21 DIAGNOSIS — R1031 Right lower quadrant pain: Secondary | ICD-10-CM

## 2022-05-21 DIAGNOSIS — I160 Hypertensive urgency: Secondary | ICD-10-CM | POA: Diagnosis present

## 2022-05-21 DIAGNOSIS — F4310 Post-traumatic stress disorder, unspecified: Secondary | ICD-10-CM | POA: Diagnosis present

## 2022-05-21 DIAGNOSIS — E785 Hyperlipidemia, unspecified: Secondary | ICD-10-CM | POA: Diagnosis present

## 2022-05-21 DIAGNOSIS — Z85828 Personal history of other malignant neoplasm of skin: Secondary | ICD-10-CM | POA: Diagnosis not present

## 2022-05-21 DIAGNOSIS — K760 Fatty (change of) liver, not elsewhere classified: Secondary | ICD-10-CM | POA: Diagnosis present

## 2022-05-21 DIAGNOSIS — R112 Nausea with vomiting, unspecified: Secondary | ICD-10-CM

## 2022-05-21 DIAGNOSIS — Z8674 Personal history of sudden cardiac arrest: Secondary | ICD-10-CM | POA: Diagnosis not present

## 2022-05-21 LAB — COMPREHENSIVE METABOLIC PANEL
ALT: 27 U/L (ref 0–44)
AST: 38 U/L (ref 15–41)
Albumin: 3.1 g/dL — ABNORMAL LOW (ref 3.5–5.0)
Alkaline Phosphatase: 72 U/L (ref 38–126)
Anion gap: 15 (ref 5–15)
BUN: <5 mg/dL — ABNORMAL LOW (ref 6–20)
CO2: 24 mmol/L (ref 22–32)
Calcium: 8.9 mg/dL (ref 8.9–10.3)
Chloride: 107 mmol/L (ref 98–111)
Creatinine, Ser: 0.74 mg/dL (ref 0.44–1.00)
GFR, Estimated: >60 mL/min (ref >60)
Glucose, Bld: 135 mg/dL — ABNORMAL HIGH (ref 70–99)
Potassium: 3.1 mmol/L — ABNORMAL LOW (ref 3.5–5.1)
Sodium: 146 mmol/L — ABNORMAL HIGH (ref 135–145)
Total Bilirubin: 0.8 mg/dL (ref 0.3–1.2)
Total Protein: 6.7 g/dL (ref 6.5–8.1)

## 2022-05-21 LAB — CBC
HCT: 39.7 % (ref 36.0–46.0)
Hemoglobin: 13.2 g/dL (ref 12.0–15.0)
MCH: 30.7 pg (ref 26.0–34.0)
MCHC: 33.2 g/dL (ref 30.0–36.0)
MCV: 92.3 fL (ref 80.0–100.0)
Platelets: 389 10*3/uL (ref 150–400)
RBC: 4.3 MIL/uL (ref 3.87–5.11)
RDW: 14.1 % (ref 11.5–15.5)
WBC: 18.6 10*3/uL — ABNORMAL HIGH (ref 4.0–10.5)
nRBC: 0 % (ref 0.0–0.2)

## 2022-05-21 LAB — CBC WITH DIFFERENTIAL/PLATELET
Abs Immature Granulocytes: 0.27 10*3/uL — ABNORMAL HIGH (ref 0.00–0.07)
Basophils Absolute: 0.1 10*3/uL (ref 0.0–0.1)
Basophils Relative: 0 %
Eosinophils Absolute: 0.1 10*3/uL (ref 0.0–0.5)
Eosinophils Relative: 1 %
HCT: 52.1 % — ABNORMAL HIGH (ref 36.0–46.0)
Hemoglobin: 17 g/dL — ABNORMAL HIGH (ref 12.0–15.0)
Immature Granulocytes: 2 %
Lymphocytes Relative: 11 %
Lymphs Abs: 1.9 10*3/uL (ref 0.7–4.0)
MCH: 30.1 pg (ref 26.0–34.0)
MCHC: 32.6 g/dL (ref 30.0–36.0)
MCV: 92.2 fL (ref 80.0–100.0)
Monocytes Absolute: 0.9 10*3/uL (ref 0.1–1.0)
Monocytes Relative: 5 %
Neutro Abs: 14.8 10*3/uL — ABNORMAL HIGH (ref 1.7–7.7)
Neutrophils Relative %: 81 %
Platelets: 491 10*3/uL — ABNORMAL HIGH (ref 150–400)
RBC: 5.65 MIL/uL — ABNORMAL HIGH (ref 3.87–5.11)
RDW: 13.5 % (ref 11.5–15.5)
WBC: 18.1 10*3/uL — ABNORMAL HIGH (ref 4.0–10.5)
nRBC: 0 % (ref 0.0–0.2)

## 2022-05-21 LAB — LACTIC ACID, PLASMA
Lactic Acid, Venous: 7.2 mmol/L (ref 0.5–1.9)
Lactic Acid, Venous: 2.9 mmol/L (ref 0.5–1.9)

## 2022-05-21 LAB — BASIC METABOLIC PANEL
Anion gap: >20 — ABNORMAL HIGH (ref 5–15)
BUN: <5 mg/dL — ABNORMAL LOW (ref 6–20)
CO2: 20 mmol/L — ABNORMAL LOW (ref 22–32)
Calcium: 9.7 mg/dL (ref 8.9–10.3)
Chloride: 103 mmol/L (ref 98–111)
Creatinine, Ser: 0.94 mg/dL (ref 0.44–1.00)
GFR, Estimated: >60 mL/min (ref >60)
Glucose, Bld: 138 mg/dL — ABNORMAL HIGH (ref 70–99)
Potassium: 3.2 mmol/L — ABNORMAL LOW (ref 3.5–5.1)
Sodium: 144 mmol/L (ref 135–145)

## 2022-05-21 LAB — MAGNESIUM: Magnesium: 1.7 mg/dL (ref 1.7–2.4)

## 2022-05-21 LAB — BETA-HYDROXYBUTYRIC ACID: Beta-Hydroxybutyric Acid: 0.54 mmol/L — ABNORMAL HIGH (ref 0.05–0.27)

## 2022-05-21 LAB — LIPASE, BLOOD: Lipase: 23 U/L (ref 11–51)

## 2022-05-21 LAB — PROCALCITONIN: Procalcitonin: <0.1 ng/mL

## 2022-05-21 LAB — TSH: TSH: 0.414 u[IU]/mL (ref 0.350–4.500)

## 2022-05-21 MED ORDER — POTASSIUM CHLORIDE 10 MEQ/100ML IV SOLN
10.0000 meq | INTRAVENOUS | Status: DC
  Administered 2022-05-21: 10 meq via INTRAVENOUS
  Filled 2022-05-21: qty 100

## 2022-05-21 MED ORDER — POTASSIUM CHLORIDE 10 MEQ/100ML IV SOLN: 10.0000 meq | INTRAVENOUS | Status: DC

## 2022-05-21 MED ORDER — PNEUMOCOCCAL 20-VAL CONJ VACC 0.5 ML IM SUSY
0.5000 mL | PREFILLED_SYRINGE | INTRAMUSCULAR | Status: DC
  Filled 2022-05-21: qty 0.5

## 2022-05-21 MED ORDER — TRIMETHOBENZAMIDE HCL 100 MG/ML IM SOLN
200.0000 mg | Freq: Three times a day (TID) | INTRAMUSCULAR | Status: AC
  Administered 2022-05-21 – 2022-05-22 (×3): 200 mg via INTRAMUSCULAR
  Filled 2022-05-21 (×3): qty 2

## 2022-05-21 MED ORDER — PANTOPRAZOLE SODIUM 40 MG IV SOLR
40.0000 mg | Freq: Two times a day (BID) | INTRAVENOUS | Status: DC
  Administered 2022-05-21 – 2022-05-24 (×6): 40 mg via INTRAVENOUS
  Filled 2022-05-21 (×6): qty 10

## 2022-05-21 MED ORDER — LACTATED RINGERS IV BOLUS
1000.0000 mL | Freq: Once | INTRAVENOUS | Status: AC
  Administered 2022-05-21: 1000 mL via INTRAVENOUS

## 2022-05-21 MED ORDER — POTASSIUM CHLORIDE 10 MEQ/100ML IV SOLN
10.0000 meq | INTRAVENOUS | Status: AC
  Administered 2022-05-21 (×2): 10 meq via INTRAVENOUS
  Filled 2022-05-21 (×3): qty 100

## 2022-05-21 MED ORDER — LEVOTHYROXINE SODIUM 50 MCG PO TABS
50.0000 ug | ORAL_TABLET | Freq: Every day | ORAL | Status: DC
  Administered 2022-05-21 – 2022-05-24 (×3): 50 ug via ORAL
  Filled 2022-05-21 (×4): qty 1

## 2022-05-21 MED ORDER — METHOCARBAMOL 1000 MG/10ML IJ SOLN
500.0000 mg | Freq: Four times a day (QID) | INTRAVENOUS | Status: DC | PRN
  Administered 2022-05-21 – 2022-05-23 (×5): 500 mg via INTRAVENOUS
  Filled 2022-05-21 (×5): qty 500

## 2022-05-21 MED ORDER — FENTANYL CITRATE PF 50 MCG/ML IJ SOSY
100.0000 ug | PREFILLED_SYRINGE | Freq: Once | INTRAMUSCULAR | Status: AC
  Administered 2022-05-21: 100 ug via INTRAVENOUS
  Filled 2022-05-21: qty 2

## 2022-05-21 MED ORDER — OXYCODONE-ACETAMINOPHEN 7.5-325 MG PO TABS
1.0000 | ORAL_TABLET | Freq: Four times a day (QID) | ORAL | Status: DC
  Administered 2022-05-21 – 2022-05-24 (×11): 1 via ORAL
  Filled 2022-05-21 (×13): qty 1

## 2022-05-21 MED ORDER — SODIUM CHLORIDE 0.9 % IV SOLN
25.0000 mg | Freq: Once | INTRAVENOUS | Status: AC
  Administered 2022-05-21: 25 mg via INTRAVENOUS
  Filled 2022-05-21: qty 25

## 2022-05-21 MED ORDER — METOPROLOL TARTRATE 5 MG/5ML IV SOLN
5.0000 mg | Freq: Four times a day (QID) | INTRAVENOUS | Status: DC
  Administered 2022-05-21 – 2022-05-24 (×9): 5 mg via INTRAVENOUS
  Filled 2022-05-21 (×10): qty 5

## 2022-05-21 MED ORDER — KCL IN DEXTROSE-NACL 40-5-0.45 MEQ/L-%-% IV SOLN
INTRAVENOUS | Status: DC
  Filled 2022-05-21 (×10): qty 1000

## 2022-05-21 MED ORDER — MAGNESIUM SULFATE IN D5W 1-5 GM/100ML-% IV SOLN
1.0000 g | Freq: Once | INTRAVENOUS | Status: AC
  Administered 2022-05-21: 1 g via INTRAVENOUS
  Filled 2022-05-21: qty 100

## 2022-05-21 MED ORDER — POTASSIUM CHLORIDE 2 MEQ/ML IV SOLN
INTRAVENOUS | Status: DC
  Filled 2022-05-21: qty 1000

## 2022-05-21 MED ORDER — SODIUM CHLORIDE 0.9% FLUSH: 10.0000 mL | INTRAVENOUS | Status: DC | PRN

## 2022-05-21 MED ORDER — PANTOPRAZOLE SODIUM 40 MG IV SOLR
40.0000 mg | INTRAVENOUS | Status: DC
  Administered 2022-05-21: 40 mg via INTRAVENOUS
  Filled 2022-05-21: qty 10

## 2022-05-21 MED ORDER — TRAZODONE HCL 50 MG PO TABS
150.0000 mg | ORAL_TABLET | Freq: Every day | ORAL | Status: DC
  Administered 2022-05-21 – 2022-05-23 (×3): 150 mg via ORAL
  Filled 2022-05-21 (×5): qty 1

## 2022-05-21 MED ORDER — ARIPIPRAZOLE 5 MG PO TABS
5.0000 mg | ORAL_TABLET | Freq: Every day | ORAL | Status: DC
  Administered 2022-05-21 – 2022-05-24 (×4): 5 mg via ORAL
  Filled 2022-05-21 (×4): qty 1

## 2022-05-21 MED ORDER — FLUOXETINE HCL 20 MG PO CAPS
40.0000 mg | ORAL_CAPSULE | Freq: Every day | ORAL | Status: DC
  Administered 2022-05-21 – 2022-05-22 (×2): 40 mg via ORAL
  Filled 2022-05-21 (×2): qty 2

## 2022-05-21 MED ORDER — LACTATED RINGERS IV BOLUS: 1000.0000 mL | Freq: Once | INTRAVENOUS | Status: DC

## 2022-05-21 MED ORDER — CHLORHEXIDINE GLUCONATE CLOTH 2 % EX PADS
6.0000 | MEDICATED_PAD | Freq: Every day | CUTANEOUS | Status: DC
  Administered 2022-05-21 – 2022-05-24 (×4): 6 via TOPICAL

## 2022-05-21 MED ORDER — IOHEXOL 300 MG/ML  SOLN
100.0000 mL | Freq: Once | INTRAMUSCULAR | Status: AC | PRN
  Administered 2022-05-21: 100 mL via INTRAVENOUS

## 2022-05-21 MED ORDER — ONDANSETRON 4 MG PO TBDP
8.0000 mg | ORAL_TABLET | Freq: Once | ORAL | Status: AC
  Administered 2022-05-21: 8 mg via ORAL
  Filled 2022-05-21: qty 2

## 2022-05-21 MED ORDER — METOCLOPRAMIDE HCL 5 MG/ML IJ SOLN: 5.0000 mg | Freq: Four times a day (QID) | INTRAMUSCULAR | Status: DC

## 2022-05-21 MED ORDER — METOPROLOL TARTRATE 5 MG/5ML IV SOLN
5.0000 mg | INTRAVENOUS | Status: DC | PRN
  Administered 2022-05-21: 5 mg via INTRAVENOUS
  Filled 2022-05-21: qty 5

## 2022-05-21 MED ORDER — PROCHLORPERAZINE EDISYLATE 10 MG/2ML IJ SOLN
5.0000 mg | Freq: Four times a day (QID) | INTRAMUSCULAR | Status: AC
  Administered 2022-05-21 – 2022-05-22 (×3): 5 mg via INTRAVENOUS
  Filled 2022-05-21 (×3): qty 2

## 2022-05-21 MED ORDER — PROCHLORPERAZINE EDISYLATE 10 MG/2ML IJ SOLN
5.0000 mg | INTRAMUSCULAR | Status: DC | PRN
  Administered 2022-05-22 (×2): 5 mg via INTRAVENOUS
  Filled 2022-05-21 (×2): qty 2

## 2022-05-21 MED ORDER — KETOROLAC TROMETHAMINE 30 MG/ML IJ SOLN
30.0000 mg | Freq: Once | INTRAMUSCULAR | Status: AC
  Administered 2022-05-21: 30 mg via INTRAVENOUS
  Filled 2022-05-21: qty 1

## 2022-05-21 MED ORDER — METOCLOPRAMIDE HCL 5 MG/ML IJ SOLN
10.0000 mg | Freq: Four times a day (QID) | INTRAMUSCULAR | Status: DC
  Administered 2022-05-21 – 2022-05-22 (×4): 10 mg via INTRAVENOUS
  Filled 2022-05-21 (×5): qty 2

## 2022-05-21 MED ORDER — PRAZOSIN HCL 1 MG PO CAPS
2.0000 mg | ORAL_CAPSULE | Freq: Every day | ORAL | Status: DC
  Administered 2022-05-21 – 2022-05-22 (×2): 2 mg via ORAL
  Filled 2022-05-21 (×4): qty 2

## 2022-05-21 MED ORDER — LORAZEPAM 2 MG/ML IJ SOLN
1.0000 mg | Freq: Once | INTRAMUSCULAR | Status: AC
  Administered 2022-05-21: 1 mg via INTRAVENOUS
  Filled 2022-05-21: qty 1

## 2022-05-21 MED ORDER — SODIUM CHLORIDE 0.9% FLUSH
10.0000 mL | Freq: Two times a day (BID) | INTRAVENOUS | Status: DC
  Administered 2022-05-21 – 2022-05-24 (×3): 10 mL

## 2022-05-21 MED ORDER — LORAZEPAM 2 MG/ML IJ SOLN
0.5000 mg | INTRAMUSCULAR | Status: DC | PRN
  Administered 2022-05-21 – 2022-05-22 (×2): 0.5 mg via INTRAVENOUS
  Filled 2022-05-21 (×2): qty 1

## 2022-05-21 NOTE — ED Notes (Signed)
ED TO INPATIENT HANDOFF REPORT  ED Nurse Name and Phone #: Tia, RN  S Name/Age/Gender [name] 52 y.o. female Room/Bed: WA16/WA16  Code Status   Code Status: Prior  Home/SNF/Other Home Patient oriented to: self, place, time, and situation Is this baseline? Yes   Triage Complete: Triage complete  Chief Complaint Cyclical vomiting with nausea [R11.15] SIRS (systemic inflammatory response syndrome) (HCC) [R65.10]  Triage Note N/V/D x 6 hours.   Hx T2DM, brain injury, and cyclic vomiting.  Sts several recent admissions this year for same symptoms.    Allergies No Known Allergies  Level of Care/Admitting Diagnosis ED Disposition     ED Disposition  Admit   Condition  --   Comment  Hospital Area: [facility] [[id number]]  Level of Care: Progressive [102]  Admit to Progressive based on following criteria: MULTISYSTEM THREATS such as stable sepsis, metabolic/electrolyte imbalance with or without encephalopathy that is responding to early treatment.  May admit patient to [facility] or [facility] if equivalent level of care is available:: Yes  Covid Evaluation: Asymptomatic - no recent exposure (last 10 days) testing not required  Diagnosis: SIRS (systemic inflammatory response syndrome) [facility]) [[id number]]  Admitting Physician: [place], [place]  Attending Physician: [facility], [place] [[id number]]  Certification:: I certify this patient will need inpatient services for at least 2 midnights          B Medical/Surgery History Past Medical History:  Diagnosis Date   Anxiety    Arthritis    "joints ache all over" (10/15/2014)   Barrett's esophagus    Bulging lumbar disc    Chronic lower back pain    DDD (degenerative disc disease), cervical    Depression    DM (diabetes mellitus) ([place])    Drug-seeking behavior    Headache    "weekly" (10/15/2014)   Hyperlipemia    Hypertension    PTSD (post-traumatic stress disorder)    Skin cancer    "had  them cut off my arms; don't know what kind"   Past Surgical History:  Procedure Laterality Date   ABLATION ON ENDOMETRIOSIS  2008   BIOPSY  12/27/2018   Procedure: BIOPSY;  Surgeon: Thornton Park, MD;  [facility]OS[facility];  Service: Gastroenterology;;   BIOPSY  10/05/2021   Procedure: BIOPSY;  Surgeon: Gatha Mayer, MD;  Location: [facility] ENDOSCOPY;  Service: Gastroenterology;;   ESOPHAGOGASTRODUODENOSCOPY (EGD) WITH PROPOFOL N/A 12/27/2018   Procedure: ESOPHAGOGASTRODUODENOSCOPY (EGD) WITH PROPOFOL;  Surgeon: Thornton Park, MD;  [facility];  Service: Gastroenterology;  Laterality: N/A;   ESOPHAGOGASTRODUODENOSCOPY (EGD) WITH PROPOFOL N/A 10/05/2021   Procedure: ESOPHAGOGASTRODUODENOSCOPY (EGD) WITH PROPOFOL;  Surgeon: Gatha Mayer, MD;  Location: [place];  Service: Gastroenterology;  Laterality: N/A;   HEMORRHOID SURGERY  ~ 2002   IR FLUORO GUIDE CV LINE RIGHT  06/14/2020   IR REMOVAL TUN CV CATH W/O FL  06/23/2020   IR US GUIDE VASC ACCESS RIGHT  06/14/2020   ORIF ANKLE FRACTURE Right 03/28/2020   Procedure: OPEN REDUCTION INTERNAL FIXATION (ORIF) RIGHT BIMALLEOLAR ANKLE FRACTURE;  Surgeon: Marchia Bond, MD;  Location: [place];  Service: Orthopedics;  Laterality: Right;     A IV Location/Drains/Wounds Patient Lines/Drains/Airways Status     Active Line/Drains/Airways     Name Placement date Placement time Site Days   External Urinary Catheter 04/24/22  1900  --  27            Intake/Output Last 24 hours  Intake/Output  Summary (Last 24 hours) at 05/21/2022 1250 Last data filed at 05/21/2022 1019 Gross per 24 hour  Intake 1424.17 ml  Output --  Net 1424.17 ml    Labs/Imaging Results for orders placed or performed during the hospital encounter of 05/20/22 (from the past 48 hour(s))  CBG monitoring, ED     Status: Abnormal   Collection Time: 05/20/22 11:46 PM  Result Value Ref Range   Glucose-Capillary 147 (H) 70 - 99 mg/dL     Comment: Glucose reference range applies only to samples taken after fasting for at least 8 hours.  CBC with Differential     Status: Abnormal   Collection Time: 05/20/22 11:54 PM  Result Value Ref Range   WBC 18.1 (H) 4.0 - 10.5 K/uL   RBC 5.65 (H) 3.87 - 5.11 MIL/uL   Hemoglobin 17.0 (H) 12.0 - 15.0 g/dL   HCT 52.1 (H) 36.0 - 46.0 %   MCV 92.2 80.0 - 100.0 fL   MCH 30.1 26.0 - 34.0 pg   MCHC 32.6 30.0 - 36.0 g/dL   RDW 13.5 11.5 - 15.5 %   Platelets 491 (H) 150 - 400 K/uL   nRBC 0.0 0.0 - 0.2 %   Neutrophils Relative % 81 %   Neutro Abs 14.8 (H) 1.7 - 7.7 K/uL   Lymphocytes Relative 11 %   Lymphs Abs 1.9 0.7 - 4.0 K/uL   Monocytes Relative 5 %   Monocytes Absolute 0.9 0.1 - 1.0 K/uL   Eosinophils Relative 1 %   Eosinophils Absolute 0.1 0.0 - 0.5 K/uL   Basophils Relative 0 %   Basophils Absolute 0.1 0.0 - 0.1 K/uL   Immature Granulocytes 2 %   Abs Immature Granulocytes 0.27 (H) 0.00 - 0.07 K/uL    Comment: Performed at [facility], [place] [address], [place], [place] [address]  Basic metabolic panel     Status: Abnormal   Collection Time: 05/20/22 11:54 PM  Result Value Ref Range   Sodium 144 135 - 145 mmol/L    Comment: ELECTROLYTES REPEATED TO VERIFY   Potassium 3.2 (L) 3.5 - 5.1 mmol/L   Chloride 103 98 - 111 mmol/L    Comment: ELECTROLYTES REPEATED TO VERIFY   CO2 20 (L) 22 - 32 mmol/L    Comment: ELECTROLYTES REPEATED TO VERIFY   Glucose, Bld 138 (H) 70 - 99 mg/dL    Comment: Glucose reference range applies only to samples taken after fasting for at least 8 hours.   BUN <5 (L) 6 - 20 mg/dL   Creatinine, Ser 0.94 0.44 - 1.00 mg/dL   Calcium 9.7 8.9 - 10.3 mg/dL   GFR, Estimated >60 >60 mL/min    Comment: (NOTE) Calculated using the CKD-EPI Creatinine Equation (2021)    Anion gap >20 (H) 5 - 15    Comment: Electrolytes repeated to confirm. Performed at [facility], [place] [address], [place], [place] [address]    Beta-hydroxybutyric acid     Status: Abnormal   Collection Time: 05/20/22 11:54 PM  Result Value Ref Range   Beta-Hydroxybutyric Acid 0.54 (H) 0.05 - 0.27 mmol/L    Comment: Performed at [facility], [place] [address] [place], [place] [address]  Lactic acid, plasma     Status: Abnormal   Collection Time: 05/21/22  8:46 AM  Result Value Ref Range   Lactic Acid, Venous 7.2 (HH) 0.5 - 1.9 mmol/L    Comment: CRITICAL RESULT CALLED TO, READ BACK BY AND VERIFIED  WITH Darel Hong RN AT [phone] 05/21/22 MULLINS,T Performed at [facility], [place] [address], [place], [place] [address]    CT ABDOMEN PELVIS W CONTRAST  Result Date: 05/21/2022 CLINICAL DATA:  Right lower quadrant abdominal pain, nausea/vomiting/diarrhea EXAM: CT ABDOMEN AND PELVIS WITH CONTRAST TECHNIQUE: Multidetector CT imaging of the abdomen and pelvis was performed using the standard protocol following bolus administration of intravenous contrast. RADIATION DOSE REDUCTION: This exam was performed according to the departmental dose-optimization program which includes automated exposure control, adjustment of the mA and/or kV according to patient size and/or use of iterative reconstruction technique. CONTRAST:  170m OMNIPAQUE IOHEXOL 300 MG/ML  SOLN COMPARISON:  04/23/2022 FINDINGS: Lower chest: Lung bases are clear. Hepatobiliary: Liver is within normal limits. Gallbladder is unremarkable. No intrahepatic or extrahepatic ductal dilatation. Pancreas: Within normal limits. Spleen: Within normal limits. Adrenals/Urinary Tract: Adrenal glands are within normal limits. Kidneys are within normal limits.  No hydronephrosis. Bladder is within normal limits. Stomach/Bowel: Stomach is within normal limits. No evidence of bowel obstruction. Normal appendix (series 2/56) No colonic wall thickening or inflammatory changes. Vascular/Lymphatic: No evidence of abdominal aortic aneurysm. No suspicious abdominopelvic  lymphadenopathy. Reproductive: Uterus is within normal limits. Bilateral ovaries are within normal limits. Other: No abdominopelvic ascites. Musculoskeletal: Visualized osseous structures are within normal limits. IMPRESSION: Negative CT abdomen/pelvis. Normal appendix. Electronically Signed   By: SJulian HyM.D.   On: 05/21/2022 02:53    Pending Labs Unresulted Labs (From admission, onward)     Start     Ordered   05/22/22 0500  CBC  Daily,   R      05/21/22 1016   05/22/22 04401 Basic metabolic panel  Daily,   R      05/21/22 1016   05/21/22 1200  Lactic acid, plasma  STAT Now then every 3 hours,   R (with STAT occurrences)      05/21/22 1018   05/21/22 1033  Lipase, blood  ONCE - STAT,   STAT        05/21/22 1032   05/21/22 1019  TSH  Once,   R        05/21/22 1018   05/21/22 0621  Magnesium  ONCE - STAT,   STAT        05/21/22 0620   05/21/22 0620  Comprehensive metabolic panel  ONCE - STAT,   STAT        05/21/22 0619   05/21/22 0620  CBC  ONCE - STAT,   STAT        05/21/22 0619   05/21/22 0620  Procalcitonin - Baseline  ONCE - URGENT,   URGENT        05/21/22 0619   05/20/22 2357  Urinalysis, Routine w reflex microscopic -Urine, Clean Catch  Once,   URGENT       Question:  Specimen Source  Answer:  Urine, Clean Catch   05/20/22 2357   05/20/22 2357  Rapid urine drug screen (hospital performed)  ONCE - STAT,   STAT        05/20/22 2357            Vitals/Pain Today's Vitals   05/21/22 1030 05/21/22 1047 05/21/22 1053 05/21/22 1134  BP:  (!) 187/107    Pulse: (!) 130 (!) 131    Resp: '14 16 16   '$ Temp:  99.6 F (37.6 C)    TempSrc:  Oral    SpO2:  97%    Weight:  Height:      PainSc:  7   Asleep    Isolation Precautions No active isolations  Medications Medications  potassium chloride 10 mEq in 100 mL IVPB (0 mEq Intravenous Stopped 05/21/22 0556)  potassium chloride 10 mEq in 100 mL IVPB (0 mEq Intravenous Stopped 05/21/22 1018)  lactated ringers  1,000 mL with potassium chloride 20 mEq infusion ( Intravenous Stopped 05/21/22 1018)  ARIPiprazole (ABILIFY) tablet 5 mg (5 mg Oral Given 05/21/22 1011)  FLUoxetine (PROZAC) capsule 40 mg (40 mg Oral Given 05/21/22 1010)  levothyroxine (SYNTHROID) tablet 50 mcg (50 mcg Oral Given 05/21/22 0636)  oxyCODONE-acetaminophen (PERCOCET) 7.5-325 MG per tablet 1 tablet (1 tablet Oral Given 05/21/22 1011)  prazosin (MINIPRESS) capsule 2 mg (has no administration in time range)  traZODone (DESYREL) tablet 150 mg (has no administration in time range)  prochlorperazine (COMPAZINE) injection 5 mg (5 mg Intravenous Given 05/21/22 0615)    Followed by  prochlorperazine (COMPAZINE) injection 5 mg (has no administration in time range)  metoCLOPramide (REGLAN) injection 10 mg (10 mg Intravenous Not Given 05/21/22 1011)  pantoprazole (PROTONIX) injection 40 mg (has no administration in time range)  lactated ringers bolus 1,000 mL (has no administration in time range)  metoprolol tartrate (LOPRESSOR) injection 5 mg (has no administration in time range)  trimethobenzamide (TIGAN) injection 200 mg (has no administration in time range)  sodium chloride flush (NS) 0.9 % injection 10-40 mL (has no administration in time range)  sodium chloride flush (NS) 0.9 % injection 10-40 mL (has no administration in time range)  lactated ringers bolus 2,000 mL (0 mLs Intravenous Stopped 05/21/22 1019)  metoCLOPramide (REGLAN) injection 10 mg (10 mg Intravenous Given 05/21/22 0005)  fentaNYL (SUBLIMAZE) injection 100 mcg (100 mcg Intravenous Given 05/21/22 0006)  lactated ringers bolus 1,000 mL (1,000 mLs Intravenous Bolus 05/21/22 0116)  promethazine (PHENERGAN) 25 mg in sodium chloride 0.9 % 50 mL IVPB (0 mg Intravenous Stopped 05/21/22 0556)  fentaNYL (SUBLIMAZE) injection 100 mcg (100 mcg Intravenous Given 05/21/22 0216)  iohexol (OMNIPAQUE) 300 MG/ML solution 100 mL (100 mLs Intravenous Contrast Given 05/21/22 0233)  LORazepam (ATIVAN)  injection 1 mg (1 mg Intravenous Given 05/21/22 0330)  fentaNYL (SUBLIMAZE) injection 100 mcg (100 mcg Intravenous Given 05/21/22 0535)  magnesium sulfate IVPB 1 g 100 mL (0 g Intravenous Stopped 05/21/22 0845)  ketorolac (TORADOL) 30 MG/ML injection 30 mg (30 mg Intravenous Given 05/21/22 0615)    Mobility walks     Focused Assessments Neuro Assessment Handoff:  Swallow screen pass?  N/A         Neuro Assessment: Within Defined Limits Neuro Checks:      Has TPA been given? No If patient is a Neuro Trauma and patient is going to OR before floor call report to [place] nurse: [phone] or [phone]  , Pulmonary Assessment Handoff:  Lung sounds:   O2 Device: Room Air      R Recommendations: See Admitting Provider Note  Report given to:   Additional Notes:  Patient removed IV, IV team contacted with failed attempt, Admitting MD aware order placed for Midline

## 2022-05-21 NOTE — H&P (Addendum)
PCP:   Ferd Hibbs, NP   Chief Complaint:  Nausea, vomiting, abdominal pain  HPI: This is a 52 year old female with known medical history of diabetes mellitus type 2, hypothyroidism, bipolar disease, PTSD, cyclic vomiting, hypertension, cardiac arrest [with MI in 3/22] which led to anoxic brain injury.  Her cardiac arrest is believed to be secondary to opiate overdose in Feb, 22, multiple urine drug screens positive for amphetamines (which she is not prescribed as an outpatient) of which she denies taking. The patient has multiple admissions recently 03/03/2022 to 03/21/2022, 03/22/2022 to 03/25/2022, 04/23/2022 to 04/27/2022 and today, most of similar complaints She presents tonight with complaint of nausea and vomiting that started yesterday.  She had abdominal pain most of the night mostly in the right upper quadrant.  She denies hematemesis or diarrhea, she denies fevers but endorses being cold.  She denies being lightheaded or dizzy.  She states she has taken her Zofran, Reglan and Protonix without any improvement.  She is out of her Phenergan oral and suppository.  She has not taken any medication since this morning.  She decided to come to the ER. Patient had a EGD on upper 10/05/21 which showed a small hiatal hernia and patchy moderate inflammation characterized by edema, erythema in the gastric antrum.   In ER patient treated with IV Reglan, IV Zofran and IV fluids.  She remains nauseous and vomiting.  Patient white blood count elevated at 18, and her pain is mostly in the right upper quadrant.  CT abdomen pelvis repeated was negative.   Review of Systems:  The patient denies anorexia, fever, weight loss,, vision loss, decreased hearing, hoarseness, chest pain, syncope, dyspnea on exertion, peripheral edema, balance deficits, hemoptysis, melena, hematochezia, severe indigestion/heartburn, hematuria, incontinence, genital sores, muscle weakness, suspicious skin lesions, transient blindness,  difficulty walking, depression, unusual weight change, abnormal bleeding, enlarged lymph nodes, angioedema, and breast masses. Positive: Nausea, vomiting, lightheadedness, abdominal pain  Past Medical History: Past Medical History:  Diagnosis Date   Anxiety    Arthritis    "joints ache all over" (10/15/2014)   Barrett's esophagus    Bulging lumbar disc    Chronic lower back pain    DDD (degenerative disc disease), cervical    Depression    DM (diabetes mellitus) ([place])    Drug-seeking behavior    Headache    "weekly" (10/15/2014)   Hyperlipemia    Hypertension    PTSD (post-traumatic stress disorder)    Skin cancer    "had them cut off my arms; don't know what kind"   Past Surgical History:  Procedure Laterality Date   ABLATION ON ENDOMETRIOSIS  2008   BIOPSY  12/27/2018   Procedure: BIOPSY;  Surgeon: Thornton Park, MD;  Location: [facility] ENDOSCOPY;  Service: Gastroenterology;;   BIOPSY  10/05/2021   Procedure: BIOPSY;  Surgeon: Gatha Mayer, MD;  Location: [facility];  Service: Gastroenterology;;   ESOPHAGOGASTRODUODENOSCOPY (EGD) WITH PROPOFOL N/A 12/27/2018   Procedure: ESOPHAGOGASTRODUODENOSCOPY (EGD) WITH PROPOFOL;  Surgeon: Thornton Park, MD;  Location: [facility]OS[facility];  Service: Gastroenterology;  Laterality: N/A;   ESOPHAGOGASTRODUODENOSCOPY (EGD) WITH PROPOFOL N/A 10/05/2021   Procedure: ESOPHAGOGASTRODUODENOSCOPY (EGD) WITH PROPOFOL;  Surgeon: Gatha Mayer, MD;  Location: [place];  Service: Gastroenterology;  Laterality: N/A;   HEMORRHOID SURGERY  ~ 2002   IR FLUORO GUIDE CV LINE RIGHT  06/14/2020   IR REMOVAL TUN CV CATH W/O FL  06/23/2020   IR US GUIDE VASC ACCESS RIGHT  06/14/2020   ORIF ANKLE  FRACTURE Right 03/28/2020   Procedure: OPEN REDUCTION INTERNAL FIXATION (ORIF) RIGHT BIMALLEOLAR ANKLE FRACTURE;  Surgeon: Marchia Bond, MD;  Location: [place];  Service: Orthopedics;  Laterality: Right;    Medications: Prior to Admission medications    Medication Sig Start Date End Date Taking? Authorizing Provider  acetaminophen (TYLENOL) 500 MG tablet Take 500-1,000 mg by mouth every 6 (six) hours as needed for mild pain or headache.    [provider]  ARIPiprazole (ABILIFY) 5 MG tablet Take 1 tablet (5 mg total) by mouth daily. 08/01/20   Jamse Arn, MD  atorvastatin (LIPITOR) 10 MG tablet Take 10 mg by mouth every evening. 08/26/20   [provider]  FLUoxetine (PROZAC) 40 MG capsule Take 1 capsule (40 mg total) by mouth daily. 07/01/20   Angiulli, Lavon Paganini, PA-C  lactulose (CHRONULAC) 10 GM/15ML solution Take 30 mLs (20 g total) by mouth daily as needed for mild constipation. 04/27/22   Antonieta Pert, MD  levothyroxine (SYNTHROID) 50 MCG tablet Take 50 mcg by mouth daily before breakfast.    [provider]  lidocaine (XYLOCAINE) 5 % ointment Apply 1 Application topically daily as needed for mild pain. 02/26/22   [provider]  metFORMIN (GLUCOPHAGE) 500 MG tablet Take 500 mg by mouth 2 (two) times daily with a meal.    [provider]  metoCLOPramide (REGLAN) 5 MG tablet Take 1 tablet (5 mg total) by mouth 3 (three) times daily before meals for 5 days. 04/27/22 05/03/22  Antonieta Pert, MD  multivitamin (RENA-VIT) TABS tablet Take 1 tablet by mouth at bedtime. 08/01/20   Jamse Arn, MD  omeprazole (PRILOSEC) 20 MG capsule Take 20 mg by mouth daily as needed (only if Pantoprazole is not available).    [provider]  ondansetron (ZOFRAN-ODT) 8 MG disintegrating tablet Take 1 tablet (8 mg total) by mouth every 8 (eight) hours as needed for nausea or vomiting. 05/03/22   Thornton Park, MD  oxyCODONE-acetaminophen (PERCOCET) 7.5-325 MG tablet Take 1 tablet by mouth 4 (four) times daily.    [provider]  pantoprazole (PROTONIX) 40 MG tablet Take 1 tablet (40 mg total) by mouth 2 (two) times daily. 05/03/22 05/03/23  Thornton Park, MD  prazosin (MINIPRESS) 2 MG capsule Take 1  capsule (2 mg total) by mouth at bedtime. 08/01/20   Jamse Arn, MD  promethazine (PHENERGAN) 25 MG suppository Place 1 suppository (25 mg total) rectally every 6 (six) hours as needed for nausea or vomiting. 12/27/21   Drenda Freeze, MD  promethazine (PHENERGAN) 25 MG tablet Take 25 mg by mouth every 6 (six) hours as needed for nausea or vomiting.    [provider]  tamsulosin (FLOMAX) 0.4 MG CAPS capsule Take 1 capsule (0.4 mg total) by mouth daily after breakfast. 04/28/22 05/28/22  Antonieta Pert, MD  tiZANidine (ZANAFLEX) 4 MG tablet Take 3 tablets (12 mg total) by mouth every 8 (eight) hours as needed for up to 12 doses for muscle spasms. 04/27/22   Antonieta Pert, MD  traZODone (DESYREL) 50 MG tablet Take 3 tablets (150 mg total) by mouth at bedtime. 09/21/21   Elodia Florence., MD    Allergies:  No Known Allergies  Social History:  reports that she has never smoked. She has never used smokeless tobacco. She reports that she does not currently use alcohol. She reports that she does not use drugs.  Family History: Family History  Problem Relation Age of Onset  Breast cancer Mother    Diabetes Mother    Breast cancer Maternal Grandmother    Breast cancer Paternal Grandmother    Colon polyps Paternal Grandmother    Colon cancer Neg Hx    Esophageal cancer Neg Hx    Rectal cancer Neg Hx    Stomach cancer Neg Hx     Physical Exam: Vitals:   05/20/22 2347 05/21/22 0045 05/21/22 0200 05/21/22 0215  BP:  (!) 190/85 (!) 166/126 (!) 183/104  Pulse:  (!) 123 (!) 130 (!) 159  Resp:  (!) 26 (!) 28 17  Temp:      TempSrc:      SpO2:  99% 97% 96%  Weight: 86.2 kg     Height: 5' 4.5" (1.638 m)       General:  Alert and oriented times three, well developed, obese female, uncomfortable female Eyes: PERRLA, pink conjunctiva, no scleral icterus ENT: Moist oral mucosa, neck supple, no thyromegaly Lungs: clear to ascultation, no wheeze, no crackles, no use of accessory  muscles Cardiovascular: tachycardia, regular rate and rhythm, no regurgitation, no gallops, no murmurs. No carotid bruits, no JVD Abdomen: soft, positive BS, positive TTP greatest on the right side and epigastric area, non-distended, no organomegaly, not an acute abdomen GU: not examined Neuro: CN II - XII grossly intact, sensation intact Musculoskeletal: strength 5/5 all extremities, no clubbing, cyanosis or edema Skin: no rash, no subcutaneous crepitation, no decubitus Psych: appropriate patient   Labs on Admission:  Recent Labs    05/20/22 2354  NA 144  K 3.2*  CL 103  CO2 20*  GLUCOSE 138*  BUN <5*  CREATININE 0.94  CALCIUM 9.7    Recent Labs    05/20/22 2354  WBC 18.1*  NEUTROABS 14.8*  HGB 17.0*  HCT 52.1*  MCV 92.2  PLT 491*    Radiological Exams on Admission: CT ABDOMEN PELVIS W CONTRAST  Result Date: 05/21/2022 CLINICAL DATA:  Right lower quadrant abdominal pain, nausea/vomiting/diarrhea EXAM: CT ABDOMEN AND PELVIS WITH CONTRAST TECHNIQUE: Multidetector CT imaging of the abdomen and pelvis was performed using the standard protocol following bolus administration of intravenous contrast. RADIATION DOSE REDUCTION: This exam was performed according to the departmental dose-optimization program which includes automated exposure control, adjustment of the mA and/or kV according to patient size and/or use of iterative reconstruction technique. CONTRAST:  158m OMNIPAQUE IOHEXOL 300 MG/ML  SOLN COMPARISON:  04/23/2022 FINDINGS: Lower chest: Lung bases are clear. Hepatobiliary: Liver is within normal limits. Gallbladder is unremarkable. No intrahepatic or extrahepatic ductal dilatation. Pancreas: Within normal limits. Spleen: Within normal limits. Adrenals/Urinary Tract: Adrenal glands are within normal limits. Kidneys are within normal limits.  No hydronephrosis. Bladder is within normal limits. Stomach/Bowel: Stomach is within normal limits. No evidence of bowel obstruction.  Normal appendix (series 2/56) No colonic wall thickening or inflammatory changes. Vascular/Lymphatic: No evidence of abdominal aortic aneurysm. No suspicious abdominopelvic lymphadenopathy. Reproductive: Uterus is within normal limits. Bilateral ovaries are within normal limits. Other: No abdominopelvic ascites. Musculoskeletal: Visualized osseous structures are within normal limits. IMPRESSION: Negative CT abdomen/pelvis. Normal appendix. Electronically Signed   By: SJulian HyM.D.   On: 05/21/2022 02:53    Assessment/Plan Present on Admission:  Abdominal pain/ Refractory nausea and vomiting  Cyclical vomiting syndrome -Admit to med telemetry -Schedule IV Compazine, as needed tach Tigan -IV fluid hydration -Schedule IV Reglan and IV Protonix -Patient's Percocet be ordered.  As needed Toradol added   Hypokalemia -Repleting IV -Repeat BMP ordered for noon  Prolonged QTc -537.  Normalized potassium and magnesium.  Magnesium added on.  Potassium runs x 5 ordered.  Plus LR with potassium -Repeat EKG at noon   High anion gap metabolic acidosis -This is recurrent for patient.  Doubt DKA.  Possibly starvation ketoacidosis.  Patient had metformin.  This has been held. -In the past that has resolved with IV fluid hydration, patient being hydrated, repeat BMP ordered   Tachycardia -Monitoring on telemetry, IV fluid hydration   Bipolar affective disorder (HCC)/ PTSD (post-traumatic stress disorder) -Bipolar resumed   Chronic pain syndrome -As needed Toradol ordered   Hypertension -Minipress resumed, as needed blood pressure medications ordered   Hyperlipidemia -Stable, active on the Holter   Hypothyroidism -Stable, Synthroid resumed   Possible substance abuse ([place])  Anoxic brain damage ([place])   Sigifredo Pignato 05/21/2022, 5:21 AM

## 2022-05-21 NOTE — Plan of Care (Signed)
  Problem: Education: Goal: Knowledge of General Education information will improve Description: Including pain rating scale, medication(s)/side effects and non-pharmacologic comfort measures Outcome: Progressing   Problem: Pain Managment: Goal: General experience of comfort will improve Outcome: Progressing

## 2022-05-21 NOTE — Progress Notes (Signed)
Peripherally Inserted Central Catheter Placement  The IV Nurse has discussed with the patient and/or persons authorized to consent for the patient, the purpose of this procedure and the potential benefits and risks involved with this procedure.  The benefits include less needle sticks, lab draws from the catheter, and the patient may be discharged home with the catheter. Risks include, but not limited to, infection, bleeding, blood clot (thrombus formation), and puncture of an artery; nerve damage and irregular heartbeat and possibility to perform a PICC exchange if needed/ordered by physician.  Alternatives to this procedure were also discussed.  Bard Power PICC patient education guide, fact sheet on infection prevention and patient information card has been provided to patient /or left at bedside.    PICC Placement Documentation  PICC Double Lumen 41/74/08 Right Basilic 36 cm 0 cm (Active)  Indication for Insertion or Continuance of Line Poor Vasculature-patient has had multiple peripheral attempts or PIVs lasting less than 24 hours 05/21/22 1732  Exposed Catheter (cm) 0 cm 05/21/22 1732  Site Assessment Clean, Dry, Intact 05/21/22 1732  Lumen #1 Status Flushed;Blood return noted;Saline locked 05/21/22 1732  Lumen #2 Status Flushed;Saline locked;Blood return noted 05/21/22 1732  Dressing Type Transparent;Securing device 05/21/22 1732  Dressing Status Antimicrobial disc in place 05/21/22 1732  Safety Lock Not Applicable 14/48/18 5631  Dressing Change Due 05/28/22 05/21/22 [place] 05/21/2022, 5:35 PM

## 2022-05-21 NOTE — ED Notes (Signed)
Patient continued to bother IV site after RN and Techs asked her not to. RN contacted IV team to establish new line but they were unsuccessful. RN went in to give IV meds and upon assessment RN noticed patient had removed IV and running fluids spilled on floor. RN contacted admitting MD.

## 2022-05-21 NOTE — Progress Notes (Signed)
[facility]                                                                              [name], is a 52 y.o. female, DOB - [birth year], YSA:[id number] Admit date - 05/20/2022    Outpatient Primary MD for the patient is Ferd Hibbs, NP  LOS - 0  days  Chief Complaint  Patient presents with   N/V/D       Brief summary   Patient is a 52 year old female with diabetes mellitus type 2, NIDDM, hypothyroidism, bipolar disorder, PTSD, cyclical vomiting syndrome, HTN, prior history of cardiac arrest with MI in 06/2020 which led to anoxic brain injury (believed secondary to opiate overdose in February, UDS was positive for amphetamines).  Patient has had multiple admissions with similar complaints, last admission 04/23/2022-04/27/2022. Presented with nausea vomiting that started a day before the admission, abdominal pain in the right upper quadrant.  Denied any hematemesis or diarrhea.  Denied dizziness or lightheadedness.  Patient stated that she had taken Zofran, Reglan and Protonix without improvement.  She ran out of Phenergan oral and suppository. She had EGD on 10/05/2021 which showed small hiatal hernia and patchy moderate inflammation characterized by edema, erythema in the gastric antrum.  CT abdomen pelvis negative for acute abdominal pathology.  Patient was admitted for further workup.   Assessment & Plan    Principal Problem:   SIRS (systemic inflammatory response syndrome) (HCC), lactic acidosis, intractable nausea vomiting Cyclical vomiting syndrome -Continue scheduled IV Reglan, prn Compazine, added scheduled to Tigan 200 mg IM every 8 hours x 3 doses -Continue n.p.o., IV fluids, pain control -CT abdomen showed no acute abdominal pathology, check lipase -Lactic acid 7.2, added IV fluid bolus followed by IV fluids at 125 cc an hour   Active Problems: High anion gap metabolic acidosis -Likely due to lactic acidosis, metformin use, unclear of ingestion of  illicit substance -UDS pending -Continue IV fluid hydration    Substance abuse (HCC) -UDS pending    Bipolar affective disorder (HCC) -Continue Abilify, Prozac    Essential hypertension, Tachycardia -Likely due to #1, placed on IV scheduled metoprolol as patient is n.p.o.    T2DM (type 2 diabetes mellitus) ([place]) - placed on sensitive sliding scale insulin, hold metformin -Obtain hemoglobin A1c     Hyperlipidemia -Hold statin    Chronic pain syndrome -Continue pain control    Hypothyroidism -Follow TSH, continue Synthroid    Hypokalemia -Replaced   Obesity Estimated body mass index is 32.11 kg/m as calculated from the following:   Height as of this encounter: 5' 4.5" (1.638 m).   Weight as of this encounter: 86.2 kg.  Code Status: Full CODE STATUS DVT Prophylaxis:  Place and maintain sequential compression device Start: 05/21/22 1017   Level of Care: Level of care: Telemetry Family Communication:  Disposition Plan:      Remains inpatient appropriate: Intractable nausea and vomiting, precludes a safe discharge   Procedures:  None  Consultants:   None  Antimicrobials: None    Medications  ARIPiprazole  5 mg Oral Daily   FLUoxetine  40 mg Oral Daily   levothyroxine  50 mcg Oral Q0600   metoCLOPramide (REGLAN) injection  10 mg Intravenous Q6H   metoprolol tartrate  5 mg Intravenous Q6H   oxyCODONE-acetaminophen  1 tablet Oral QID   pantoprazole (PROTONIX) IV  40 mg Intravenous Q12H   prazosin  2 mg Oral QHS   prochlorperazine  5 mg Intravenous Q6H   traZODone  150 mg Oral QHS      Subjective:   [name] was seen and examined today.  Feeling miserable, having intractable nausea and vomiting.  Denies any diarrhea.  No fevers or chills.  BP uncontrolled  Objective:   Vitals:   05/21/22 0215 05/21/22 0530 05/21/22 0600 05/21/22 0626  BP: (!) 183/104 (!) 144/102 (!) 197/111   Pulse: (!) 159 (!) 122 (!) 123   Resp: '17 12 20   '$ Temp:    99.1 F  (37.3 C)  TempSrc:      SpO2: 96% 100% 100%   Weight:      Height:        Intake/Output Summary (Last 24 hours) at 05/21/2022 1024 Last data filed at 05/21/2022 1019 Gross per 24 hour  Intake 1424.17 ml  Output --  Net 1424.17 ml     Wt Readings from Last 3 Encounters:  05/20/22 86.2 kg  05/03/22 86.6 kg  04/25/22 89.1 kg     Exam General: Alert and oriented x 3, NAD Cardiovascular: S1 S2 auscultated,  RRR, tachycardia Respiratory: Clear to auscultation bilaterally, no wheezing, rales or rhonchi Gastrointestinal: Soft, mild diffuse TTP , nondistended, + bowel sounds Ext: no pedal edema bilaterally Neuro: Strength 5/5 upper and lower extremities bilaterally Skin: No rashes Psych: Normal affect     Data Reviewed:  I have personally reviewed following labs    CBC Lab Results  Component Value Date   WBC 18.1 (H) 05/20/2022   RBC 5.65 (H) 05/20/2022   HGB 17.0 (H) 05/20/2022   HCT 52.1 (H) 05/20/2022   MCV 92.2 05/20/2022   MCH 30.1 05/20/2022   PLT 491 (H) 05/20/2022   MCHC 32.6 05/20/2022   RDW 13.5 05/20/2022   LYMPHSABS 1.9 05/20/2022   MONOABS 0.9 05/20/2022   EOSABS 0.1 05/20/2022   BASOSABS 0.1 03/47/4259     Last metabolic panel Lab Results  Component Value Date   NA 144 05/20/2022   K 3.2 (L) 05/20/2022   CL 103 05/20/2022   CO2 20 (L) 05/20/2022   BUN <5 (L) 05/20/2022   CREATININE 0.94 05/20/2022   GLUCOSE 138 (H) 05/20/2022   GFRNONAA >60 05/20/2022   GFRAA >60 05/05/2019   CALCIUM 9.7 05/20/2022   PHOS 3.3 09/21/2021   PROT 7.9 04/23/2022   ALBUMIN 3.6 04/23/2022   LABGLOB 3.8 01/12/2015   AGRATIO 0.8 01/12/2015   BILITOT 0.8 04/23/2022   ALKPHOS 106 04/23/2022   AST 54 (H) 04/23/2022   ALT 40 04/23/2022   ANIONGAP >20 (H) 05/20/2022    CBG (last 3)  Recent Labs    05/20/22 2346  GLUCAP 147*      Coagulation Profile: No results for input(s): "INR", "PROTIME" in the last 168 hours.   Radiology Studies: I have  personally reviewed the imaging studies  CT ABDOMEN PELVIS W CONTRAST  Result Date: 05/21/2022 CLINICAL DATA:  Right lower quadrant abdominal pain, nausea/vomiting/diarrhea EXAM: CT ABDOMEN AND PELVIS WITH CONTRAST TECHNIQUE: Multidetector CT imaging of the abdomen and pelvis was performed using the standard protocol following bolus administration of intravenous contrast. RADIATION DOSE REDUCTION: This exam was performed according to  the departmental dose-optimization program which includes automated exposure control, adjustment of the mA and/or kV according to patient size and/or use of iterative reconstruction technique. CONTRAST:  162m OMNIPAQUE IOHEXOL 300 MG/ML  SOLN COMPARISON:  04/23/2022 FINDINGS: Lower chest: Lung bases are clear. Hepatobiliary: Liver is within normal limits. Gallbladder is unremarkable. No intrahepatic or extrahepatic ductal dilatation. Pancreas: Within normal limits. Spleen: Within normal limits. Adrenals/Urinary Tract: Adrenal glands are within normal limits. Kidneys are within normal limits.  No hydronephrosis. Bladder is within normal limits. Stomach/Bowel: Stomach is within normal limits. No evidence of bowel obstruction. Normal appendix (series 2/56) No colonic wall thickening or inflammatory changes. Vascular/Lymphatic: No evidence of abdominal aortic aneurysm. No suspicious abdominopelvic lymphadenopathy. Reproductive: Uterus is within normal limits. Bilateral ovaries are within normal limits. Other: No abdominopelvic ascites. Musculoskeletal: Visualized osseous structures are within normal limits. IMPRESSION: Negative CT abdomen/pelvis. Normal appendix. Electronically Signed   By: SJulian HyM.D.   On: 05/21/2022 02:53       [name] M.D. [facility] 05/21/2022, 10:24 AM  Available via Epic secure chat 7am-7pm After 7 pm, please refer to night coverage provider listed on amion.

## 2022-05-22 ENCOUNTER — Inpatient Hospital Stay (HOSPITAL_COMMUNITY): Payer: Medicare Other

## 2022-05-22 DIAGNOSIS — R6510 Systemic inflammatory response syndrome (SIRS) of non-infectious origin without acute organ dysfunction: Secondary | ICD-10-CM | POA: Diagnosis not present

## 2022-05-22 DIAGNOSIS — R1011 Right upper quadrant pain: Secondary | ICD-10-CM

## 2022-05-22 DIAGNOSIS — E860 Dehydration: Secondary | ICD-10-CM | POA: Diagnosis not present

## 2022-05-22 DIAGNOSIS — R112 Nausea with vomiting, unspecified: Secondary | ICD-10-CM | POA: Diagnosis not present

## 2022-05-22 DIAGNOSIS — R1031 Right lower quadrant pain: Secondary | ICD-10-CM | POA: Diagnosis not present

## 2022-05-22 LAB — BASIC METABOLIC PANEL
Anion gap: 10 (ref 5–15)
BUN: <5 mg/dL — ABNORMAL LOW (ref 6–20)
CO2: 25 mmol/L (ref 22–32)
Calcium: 8.7 mg/dL — ABNORMAL LOW (ref 8.9–10.3)
Chloride: 106 mmol/L (ref 98–111)
Creatinine, Ser: 0.68 mg/dL (ref 0.44–1.00)
GFR, Estimated: >60 mL/min (ref >60)
Glucose, Bld: 141 mg/dL — ABNORMAL HIGH (ref 70–99)
Potassium: 3.4 mmol/L — ABNORMAL LOW (ref 3.5–5.1)
Sodium: 141 mmol/L (ref 135–145)

## 2022-05-22 LAB — URINALYSIS, W/ REFLEX TO CULTURE (INFECTION SUSPECTED)
Bilirubin Urine: NEGATIVE
Glucose, UA: NEGATIVE mg/dL
Hgb urine dipstick: NEGATIVE
Ketones, ur: 5 mg/dL — AB
Leukocytes,Ua: NEGATIVE
Nitrite: NEGATIVE
Protein, ur: NEGATIVE mg/dL
Specific Gravity, Urine: 1.024 (ref 1.005–1.030)
pH: 5 (ref 5.0–8.0)

## 2022-05-22 LAB — CBC
HCT: 37.8 % (ref 36.0–46.0)
Hemoglobin: 12.5 g/dL (ref 12.0–15.0)
MCH: 30.6 pg (ref 26.0–34.0)
MCHC: 33.1 g/dL (ref 30.0–36.0)
MCV: 92.6 fL (ref 80.0–100.0)
Platelets: 303 10*3/uL (ref 150–400)
RBC: 4.08 MIL/uL (ref 3.87–5.11)
RDW: 14.2 % (ref 11.5–15.5)
WBC: 14.9 10*3/uL — ABNORMAL HIGH (ref 4.0–10.5)
nRBC: 0 % (ref 0.0–0.2)

## 2022-05-22 LAB — RAPID URINE DRUG SCREEN, HOSP PERFORMED
Amphetamines: POSITIVE — AB
Barbiturates: NOT DETECTED
Benzodiazepines: POSITIVE — AB
Cocaine: NOT DETECTED
Opiates: POSITIVE — AB
Tetrahydrocannabinol: NOT DETECTED

## 2022-05-22 LAB — GLUCOSE, CAPILLARY
Glucose-Capillary: 129 mg/dL — ABNORMAL HIGH (ref 70–99)
Glucose-Capillary: 138 mg/dL — ABNORMAL HIGH (ref 70–99)

## 2022-05-22 LAB — PROCALCITONIN: Procalcitonin: <0.1 ng/mL

## 2022-05-22 LAB — HEMOGLOBIN A1C
Hgb A1c MFr Bld: 5.3 % (ref 4.8–5.6)
Mean Plasma Glucose: 105.41 mg/dL

## 2022-05-22 MED ORDER — OXYCODONE-ACETAMINOPHEN 7.5-325 MG PO TABS
1.0000 | ORAL_TABLET | Freq: Once | ORAL | Status: AC
  Administered 2022-05-22: 1 via ORAL
  Filled 2022-05-22: qty 1

## 2022-05-22 MED ORDER — SODIUM CHLORIDE 0.9 % IV SOLN: 2.0000 g | Freq: Three times a day (TID) | INTRAVENOUS | Status: DC

## 2022-05-22 MED ORDER — HYDRALAZINE HCL 20 MG/ML IJ SOLN
10.0000 mg | Freq: Four times a day (QID) | INTRAMUSCULAR | Status: DC | PRN
  Administered 2022-05-22 – 2022-05-23 (×2): 10 mg via INTRAVENOUS
  Filled 2022-05-22 (×2): qty 1

## 2022-05-22 MED ORDER — VANCOMYCIN HCL 2000 MG/400ML IV SOLN
2000.0000 mg | Freq: Once | INTRAVENOUS | Status: DC
  Filled 2022-05-22: qty 400

## 2022-05-22 MED ORDER — VANCOMYCIN HCL 1500 MG/300ML IV SOLN
1500.0000 mg | INTRAVENOUS | Status: DC
  Administered 2022-05-23: 1500 mg via INTRAVENOUS
  Filled 2022-05-22 (×2): qty 300

## 2022-05-22 MED ORDER — VANCOMYCIN HCL 2000 MG/400ML IV SOLN
2000.0000 mg | Freq: Once | INTRAVENOUS | Status: AC
  Administered 2022-05-22: 2000 mg via INTRAVENOUS
  Filled 2022-05-22: qty 400

## 2022-05-22 MED ORDER — FENTANYL CITRATE PF 50 MCG/ML IJ SOSY
25.0000 ug | PREFILLED_SYRINGE | INTRAMUSCULAR | Status: DC | PRN
  Administered 2022-05-22 – 2022-05-24 (×8): 25 ug via INTRAVENOUS
  Filled 2022-05-22 (×9): qty 1

## 2022-05-22 MED ORDER — SODIUM CHLORIDE 0.9 % IV SOLN
2.0000 g | Freq: Three times a day (TID) | INTRAVENOUS | Status: DC
  Administered 2022-05-22 – 2022-05-24 (×7): 2 g via INTRAVENOUS
  Filled 2022-05-22 (×7): qty 12.5

## 2022-05-22 MED ORDER — SODIUM CHLORIDE 0.9 % IV SOLN
12.5000 mg | Freq: Four times a day (QID) | INTRAVENOUS | Status: AC
  Administered 2022-05-22 – 2022-05-23 (×4): 12.5 mg via INTRAVENOUS
  Filled 2022-05-22 (×4): qty 12.5

## 2022-05-22 MED ORDER — INSULIN ASPART 100 UNIT/ML IJ SOLN
0.0000 [IU] | Freq: Three times a day (TID) | INTRAMUSCULAR | Status: DC
  Administered 2022-05-22 – 2022-05-24 (×3): 1 [IU] via SUBCUTANEOUS

## 2022-05-22 MED ORDER — LABETALOL HCL 5 MG/ML IV SOLN
20.0000 mg | Freq: Once | INTRAVENOUS | Status: AC
  Administered 2022-05-22: 20 mg via INTRAVENOUS
  Filled 2022-05-22: qty 4

## 2022-05-22 NOTE — Progress Notes (Signed)
[facility]                                                                              [name], is a 52 y.o. female, DOB - 05-05-1970, WGN:[id number] Admit date - 05/20/2022    Outpatient Primary MD for the patient is Ferd Hibbs, NP  LOS - 1  days  Chief Complaint  Patient presents with   N/V/D       Brief summary   Patient is a 52 year old female with diabetes mellitus type 2, NIDDM, hypothyroidism, bipolar disorder, PTSD, cyclical vomiting syndrome, HTN, prior history of cardiac arrest with MI in 06/2020 which led to anoxic brain injury (believed secondary to opiate overdose in February, UDS was positive for amphetamines).  Patient has had multiple admissions with similar complaints, last admission 04/23/2022-04/27/2022. Presented with nausea vomiting that started a day before the admission, abdominal pain in the right upper quadrant.  Denied any hematemesis or diarrhea.  Denied dizziness or lightheadedness.  Patient stated that she had taken Zofran, Reglan and Protonix without improvement.  She ran out of Phenergan oral and suppository. She had EGD on 10/05/2021 which showed small hiatal hernia and patchy moderate inflammation characterized by edema, erythema in the gastric antrum.  CT abdomen pelvis negative for acute abdominal pathology.  Patient was admitted for further workup.   Assessment & Plan    Principal Problem:   SIRS (systemic inflammatory response syndrome) (HCC), lactic acidosis, intractable nausea vomiting Cyclical vomiting syndrome -PICC line placed for access, continue IV fluids, pain control. -CT abdomen showed no acute abdominal pathology.  Lipase 23. -Continues to have intractable nausea and vomiting, GI consulted, follow abdominal ultrasound -Low-grade fevers, chills, procalcitonin less than 0.1, still has leukocytosis, although improving.  Recent admission with Acinetobacter bacteremia, will get blood cultures, UA & Cx -continue  scheduled IV Phenergan x 4 doses, as needed Compazine, IM tigan -For now placed on IV vancomycin and Rocephin empirically   Active Problems: High anion gap metabolic acidosis -Likely due to lactic acidosis, metformin use, unclear of ingestion of illicit substance -UDS pending -Lactic acid improving, continue IV fluid hydration until tolerating diet    Substance abuse ([place]) -UDS pending    Bipolar affective disorder (HCC) -Continue Abilify, Prozac  Hypertensive urgency -Continue scheduled IV Lopressor, placed on IV hydralazine as needed with parameters -Labetalol 20 mg IV x 1 -TSH 0.4    T2DM (type 2 diabetes mellitus) ([place]), NIDDM -Continue to hold metformin -Obtain hemoglobin A1c -Placed on sensitive sliding scale insulin     Hyperlipidemia -Hold statin    Chronic pain syndrome -Continue pain control    Hypothyroidism -Continue Synthroid, TSH 0.4    Hypokalemia -Replaced  Prolonged QTc -QTc 537 on admission -DC IV Reglan, Prozac -Repeat EKG in a.m.   Obesity Estimated body mass index is 32.11 kg/m as calculated from the following:   Height as of this encounter: 5' 4.5" (1.638 m).   Weight as of this encounter: 86.2 kg.  Code Status: Full CODE STATUS DVT Prophylaxis:  Place and maintain sequential compression device Start: 05/21/22 1017   Level of Care: Level of care: Progressive Family Communication:  Disposition Plan:      Remains inpatient appropriate: Intractable nausea and vomiting, precludes a safe discharge   Procedures:  None  Consultants:   None  Antimicrobials: None    Medications  ARIPiprazole  5 mg Oral Daily   Chlorhexidine Gluconate Cloth  6 each Topical Daily   FLUoxetine  40 mg Oral Daily   levothyroxine  50 mcg Oral Q0600   metoCLOPramide (REGLAN) injection  10 mg Intravenous Q6H   metoprolol tartrate  5 mg Intravenous Q6H   oxyCODONE-acetaminophen  1 tablet Oral QID   pantoprazole (PROTONIX) IV  40 mg Intravenous Q12H    pneumococcal 20-valent conjugate vaccine  0.5 mL Intramuscular Tomorrow-1000   prazosin  2 mg Oral QHS   sodium chloride flush  10-40 mL Intracatheter Q12H   traZODone  150 mg Oral QHS      Subjective:   [name] was seen and examined today.  BP uncontrolled, continues to have intractable nausea and vomiting, low-grade fevers or chills.  Feels miserable.  Objective:   Vitals:   05/22/22 0743 05/22/22 1116 05/22/22 1213 05/22/22 1258  BP: (!) 178/96 (!) 186/104 (!) 184/104 (!) 170/101  Pulse: (!) 120 (!) 117 (!) 105 96  Resp: '20 20 20   '$ Temp: 99.3 F (37.4 C) 99.2 F (37.3 C) 99.3 F (37.4 C)   TempSrc: Oral Oral Oral   SpO2: 96% 98% 92%   Weight:      Height:        Intake/Output Summary (Last 24 hours) at 05/22/2022 1313 Last data filed at 05/22/2022 0600 Gross per 24 hour  Intake 1144.65 ml  Output 300 ml  Net 844.65 ml     Wt Readings from Last 3 Encounters:  05/20/22 86.2 kg  05/03/22 86.6 kg  04/25/22 89.1 kg   Physical Exam General: Alert and oriented x 3, NAD, ill-appearing Cardiovascular: S1 S2 clear, RRR, tachycardia Respiratory: CTAB, no wheezing Gastrointestinal: Soft, mild diffuse TTP, ND, NBS Ext: no pedal edema bilaterally Neuro: no new deficits Psych: Normal affect    Data Reviewed:  I have personally reviewed following labs    CBC Lab Results  Component Value Date   WBC 14.9 (H) 05/22/2022   RBC 4.08 05/22/2022   HGB 12.5 05/22/2022   HCT 37.8 05/22/2022   MCV 92.6 05/22/2022   MCH 30.6 05/22/2022   PLT 303 05/22/2022   MCHC 33.1 05/22/2022   RDW 14.2 05/22/2022   LYMPHSABS 1.9 05/20/2022   MONOABS 0.9 05/20/2022   EOSABS 0.1 05/20/2022   BASOSABS 0.1 26/37/8588     Last metabolic panel Lab Results  Component Value Date   NA 141 05/22/2022   K 3.4 (L) 05/22/2022   CL 106 05/22/2022   CO2 25 05/22/2022   BUN <5 (L) 05/22/2022   CREATININE 0.68 05/22/2022   GLUCOSE 141 (H) 05/22/2022   GFRNONAA >60 05/22/2022    GFRAA >60 05/05/2019   CALCIUM 8.7 (L) 05/22/2022   PHOS 3.3 09/21/2021   PROT 6.7 05/21/2022   ALBUMIN 3.1 (L) 05/21/2022   LABGLOB 3.8 01/12/2015   AGRATIO 0.8 01/12/2015   BILITOT 0.8 05/21/2022   ALKPHOS 72 05/21/2022   AST 38 05/21/2022   ALT 27 05/21/2022   ANIONGAP 10 05/22/2022    CBG (last 3)  Recent Labs    05/20/22 2346  GLUCAP 147*      Coagulation Profile: No results for input(s): "INR", "PROTIME" in the last 168 hours.   Radiology Studies: I have personally reviewed  the imaging studies  [place] EKG SITE RITE  Result Date: 05/21/2022 If Site Rite image not attached, placement could not be confirmed due to current cardiac rhythm.  CT ABDOMEN PELVIS W CONTRAST  Result Date: 05/21/2022 CLINICAL DATA:  Right lower quadrant abdominal pain, nausea/vomiting/diarrhea EXAM: CT ABDOMEN AND PELVIS WITH CONTRAST TECHNIQUE: Multidetector CT imaging of the abdomen and pelvis was performed using the standard protocol following bolus administration of intravenous contrast. RADIATION DOSE REDUCTION: This exam was performed according to the departmental dose-optimization program which includes automated exposure control, adjustment of the mA and/or kV according to patient size and/or use of iterative reconstruction technique. CONTRAST:  183m OMNIPAQUE IOHEXOL 300 MG/ML  SOLN COMPARISON:  04/23/2022 FINDINGS: Lower chest: Lung bases are clear. Hepatobiliary: Liver is within normal limits. Gallbladder is unremarkable. No intrahepatic or extrahepatic ductal dilatation. Pancreas: Within normal limits. Spleen: Within normal limits. Adrenals/Urinary Tract: Adrenal glands are within normal limits. Kidneys are within normal limits.  No hydronephrosis. Bladder is within normal limits. Stomach/Bowel: Stomach is within normal limits. No evidence of bowel obstruction. Normal appendix (series 2/56) No colonic wall thickening or inflammatory changes. Vascular/Lymphatic: No evidence of abdominal aortic  aneurysm. No suspicious abdominopelvic lymphadenopathy. Reproductive: Uterus is within normal limits. Bilateral ovaries are within normal limits. Other: No abdominopelvic ascites. Musculoskeletal: Visualized osseous structures are within normal limits. IMPRESSION: Negative CT abdomen/pelvis. Normal appendix. Electronically Signed   By: SJulian HyM.D.   On: 05/21/2022 02:53       Catherine Oak M.D. [facility] 05/22/2022, 1:13 PM  Available via Epic secure chat 7am-7pm After 7 pm, please refer to night coverage provider listed on amion.

## 2022-05-22 NOTE — TOC Initial Note (Signed)
Transition of Care [facility]) - Initial/Assessment Note    Patient Details  Name: [name] MRN: [id number] Date of Birth: [birth year]  Transition of Care [facility]) CM/SW Contact:    Leeroy Cha, RN Phone Number: 05/22/2022, 8:05 AM  Clinical Narrative:                  Transition of Care [facility]) Screening Note   Patient Details  Name: [name] Date of Birth: 10/22/1970   Transition of Care [facility]) CM/SW Contact:    Leeroy Cha, RN Phone Number: 05/22/2022, 8:05 AM    Transition of Care Department [facility]) has reviewed patient and no TOC needs have been identified at this time. We will continue to monitor patient advancement through interdisciplinary progression rounds. If new patient transition needs arise, please place a TOC consult.    Expected Discharge Plan: Home/Self Care Barriers to Discharge: Continued Medical Work up   Patient Goals and CMS Choice Patient states their goals for this hospitalization and ongoing recovery are:: to go home CMS Medicare.gov Compare Post Acute Care list provided to:: Patient        Expected Discharge Plan and Services   Discharge Planning Services: CM Consult   Living arrangements for the past 2 months: Single Family Home                                      Prior Living Arrangements/Services Living arrangements for the past 2 months: Single Family Home Lives with:: Spouse Patient language and need for interpreter reviewed:: Yes Do you feel safe going back to the place where you live?: Yes            Criminal Activity/Legal Involvement Pertinent to Current Situation/Hospitalization: No - Comment as needed  Activities of Daily Living Home Assistive Devices/Equipment: None ADL Screening (condition at time of admission) Patient's cognitive ability adequate to safely complete daily activities?: Yes Is the patient deaf or have difficulty hearing?: No Does the patient have difficulty seeing, even when  wearing glasses/contacts?: No Does the patient have difficulty concentrating, remembering, or making decisions?: No Patient able to express need for assistance with ADLs?: Yes Does the patient have difficulty dressing or bathing?: No Independently performs ADLs?: Yes (appropriate for developmental age) Does the patient have difficulty walking or climbing stairs?: No Weakness of Legs: None Weakness of Arms/Hands: None  Permission Sought/Granted                  Emotional Assessment Appearance:: Appears stated age Attitude/Demeanor/Rapport: Gracious Affect (typically observed): Calm Orientation: : Oriented to Self, Oriented to Place, Oriented to  Time, Oriented to Situation Alcohol / Substance Use: Never Used Psych Involvement: No (comment)  Admission diagnosis:  Dehydration [E86.0] RLQ abdominal pain [R10.31] SIRS (systemic inflammatory response syndrome) (HCC) [R65.10] Intractable nausea and vomiting [O03.7] Cyclical vomiting with nausea [R11.15] Hypokalemia due to excessive gastrointestinal loss of potassium [E87.6] Patient Active Problem List   Diagnosis Date Noted   Cyclical vomiting with nausea 05/21/2022   Colitis 03/22/2022   Acute colitis 03/22/2022   Dehydration 03/22/2022   Refractory nausea and vomiting 03/03/2022   Multiple rib fractures 02/03/2022   High anion gap metabolic acidosis 04/88/8916   Acute encephalopathy 10/14/2021   Hypernatremia 09/20/2021   Barrett's esophagus    Cyclical vomiting syndrome 09/18/2021   Chronic pain syndrome 09/18/2021   Class 2 obesity due to excess calories with  body mass index (BMI) of 37.0 to 37.9 in adult 09/18/2021   Hypothyroidism 09/18/2021   Starvation ketoacidosis 08/27/2021   Viral gastroenteritis 08/27/2021   Intractable vomiting 08/02/2021   Aortic atherosclerosis ([place]) 08/02/2021   LFT elevation 08/02/2021   Hyperbilirubinemia 08/02/2021   Hypoalbuminemia 08/02/2021   T2DM (type 2 diabetes mellitus) ([place])  08/02/2021   Hyperlipidemia 11/11/2020   Anoxic brain damage ([place]) 08/16/2020   History of depression    Sleep disturbance    Anxiety state    N&V (nausea and vomiting)    Substance abuse ([place])    Anemia of chronic disease    Acute metabolic encephalopathy 16/60/6301   Hematochezia    Acute blood loss anemia    Acute respiratory failure with hypoxia ([place])    Non-traumatic rhabdomyolysis    Cardiac arrest ([place]) 06/08/2020   AKI (acute kidney injury) ([place]) 05/10/2020   Fracture of ankle, bimalleolar, right, closed 03/28/2020   Iron deficiency 05/05/2019   Symptomatic anemia 05/05/2019   Nausea & vomiting 12/26/2018   Essential hypertension 12/26/2018   ARF (acute renal failure) ([place]) 12/26/2018   Intractable nausea and vomiting 11/28/2018   Bipolar affective disorder ([place]) 11/28/2018   Gastroenteritis 11/19/2015   Hypomagnesemia    C. difficile colitis    SIRS (systemic inflammatory response syndrome) ([place]) 01/11/2015   Cellulitis 01/03/2015   Tachycardia    Opiate withdrawal ([place]) 12/18/2014   Depression with anxiety 12/17/2014   Herpes simplex virus type 1 (HSV-1) dermatitis    Sacral fracture ([place]) 12/12/2014   Leukocytosis 12/12/2014   Chronic lower back pain 12/12/2014   Sacral fracture, closed ([place]) 12/12/2014   Nausea with vomiting    Intractable abdominal pain 10/15/2014   Abdominal pain 10/15/2014   Hypokalemia 09/04/2014   Sepsis ([place]) 09/01/2014   Abdominal pain, generalized 09/01/2014   PTSD (post-traumatic stress disorder) 09/01/2014   Endometriosis 09/01/2014   Sinus tachycardia 09/01/2014   Lactic acidosis 09/01/2014   Severe sepsis ([place]) 09/01/2014   PCP:  Ferd Hibbs, NP Pharmacy:   [facility] [place] [address] Phone: [phone] Fax: [phone]     Social Determinants of Health (SDOH) Social History: SDOH Screenings   Food Insecurity: No Food Insecurity  (05/21/2022)  Housing: Low Risk  (05/21/2022)  Transportation Needs: No Transportation Needs (05/21/2022)  Utilities: Not At Risk (05/21/2022)  Depression (PHQ2-9): Low Risk  (08/16/2020)  Tobacco Use: Low Risk  (05/21/2022)   SDOH Interventions:     Readmission Risk Interventions   Row Labels 04/27/2022    9:05 AM 03/24/2022   11:01 AM 10/06/2021   10:23 AM  Readmission Risk Prevention Plan   Section Header. No data exists in this row.     Transportation Screening   Complete Complete Complete  PCP or Specialist Appt within 3-5 Days     Complete  HRI or [place]     Complete  Social Work Consult for [place] Planning/Counseling     Complete  Palliative Care Screening     Not Applicable  Medication Review Press photographer)   Complete Complete Referral to Pharmacy  PCP or Specialist appointment within 3-5 days of discharge   Complete Complete   HRI or [place]   Complete Complete   SW Recovery Care/Counseling Consult   Complete Complete   Palliative Care Screening   Not Applicable Not [place]   Not Applicable Not Applicable

## 2022-05-22 NOTE — Progress Notes (Signed)
Initial Nutrition Assessment  DOCUMENTATION CODES:   Obesity unspecified  INTERVENTION:   Once diet advanced: -Rena-Vit MVI daily -Magic cup TID with meals, each supplement provides 290 kcal and 9 grams of protein   -Consider small bore NGT placement if PO is unable to improve  NUTRITION DIAGNOSIS:   Increased nutrient needs related to chronic illness, nausea, vomiting as evidenced by estimated needs.  GOAL:   Patient will meet greater than or equal to 90% of their needs  MONITOR:   PO intake, Supplement acceptance, Diet advancement, Weight trends, Labs, I & O's  REASON FOR ASSESSMENT:   Malnutrition Screening Tool    ASSESSMENT:   52 year old female with diabetes mellitus type 2, NIDDM, hypothyroidism, bipolar disorder, PTSD, cyclical vomiting syndrome, HTN, prior history of cardiac arrest with MI in 06/2020 which led to anoxic brain injury (believed secondary to opiate overdose in February, UDS was positive for amphetamines).  Patient has had multiple admissions with similar complaints, last admission 04/23/2022-04/27/2022.  Patient  in room with husband at bedside. Pt restless in bed, looks very uncomfortable. Pt awaiting ultrasound and per GI will be on clears following. Pt states she has chronic nausea. Doesn't eat much if she does eat. Last consumed some pizza on 1/28.  Per RN, pt has not been vomiting today. Just had Purewick placed.  Pt reports she does not tolerate any protein supplements so does not want any ordered when diet is advanced. Pt takes a daily renal MVI.   Per husband, pt is in and out of the hospital for these symptoms every 3 weeks. Consistently not eating well.  May need to consider nutrition support if pt cannot consume enough PO.  Received IV Reglan today but was discontinued.  Medications: Compazine, D5 infusion, Phenergan  Labs reviewed: Low K   NUTRITION - FOCUSED PHYSICAL EXAM:  No depletions noted.  Diet Order:   Diet Order              Diet clear liquid Room service appropriate? Yes; Fluid consistency: Thin  Diet effective now                   EDUCATION NEEDS:   Not appropriate for education at this time  Skin:  Skin Assessment: Reviewed RN Assessment  Last BM:  1/27  Height:   Ht Readings from Last 1 Encounters:  05/20/22 5' 4.5" (1.638 m)    Weight:   Wt Readings from Last 1 Encounters:  05/20/22 86.2 kg    BMI:  Body mass index is 32.11 kg/m.  Estimated Nutritional Needs:   Kcal:  1700-1900  Protein:  75-90g  Fluid:  1.9L/day  Clayton Bibles, MS, RD, LDN Inpatient Clinical Dietitian Contact information available via Amion

## 2022-05-22 NOTE — Consult Note (Addendum)
Consultation  Referring Provider: TRH/ Rai Primary Care Physician:  Ferd Hibbs, NP Primary Gastroenterologist:  Lemmie Evens  Reason for Consultation:   Nausea/ vomiting /abdominal pain  HPI: [name] is a 52 y.o. female, known to Dr. Tarri Glenn with history of chronic nausea, cyclic vomiting and chronic abdominal pain and back pain. Patient has history of anoxic brain injury/TBI, bipolar disorder, prior cardiac arrest 2022 secondary to opiate overdose, chronic opioid use, diabetes mellitus. She has had very frequent ER visits over the past few months with nausea vomiting and abdominal pain and has had 6 CT scans as of September 2023. She did have an admission in early January with recurrent nausea vomiting and abdominal pain and SIRS/sepsis.  Cultures were positive for Acinetobacter and she was treated with IV Unasyn and discharged home on a course of Bactrim.  She had been seen in our office by Dr. Tarri Glenn on 05/03/2022 at that time without acute complaints and had refill of Phenergan and Zofran.  Gastric emptying scan was to be considered but did not feel that that would likely change management. She had undergone EGD in June 2023 which showed a small hiatal hernia, did not meet criteria for Barrett's, and patchy gastritis. Colonoscopy November 2020 for iron deficiency anemia with finding of some mild rectal inflammation biopsies were unremarkable and 2 small polyps removed which were tubular adenomas.  Presented back to the emergency room on the evening 05/20/2022 after onset of intractable nausea and vomiting over the prior 8 hours and complained of abdominal pain more focal in the right mid abdomen.  She did not have any associated diarrhea.  Unaware of any fever at home but says she has been having some chills and sweats over the past couple of days.  No respiratory symptoms Denies any dysuria.  When questioned she says she was still taking an antibiotic at home, though she should  have completed the course of Bactrim a couple of weeks ago. Other than the antibiotics she says she has not been on any other new or different medications, denies any recreational drug use or alcohol use, usually takes 4 oxycodone per day  CT of the abdomen and pelvis on admission with contrast unremarkable.  cbc on admit showed WBC of 18.1/hemoglobin 17/hematocrit 52.1 Lactate 7.2 K+3.2/CO2 20/BUN 5/creatinine 0.94/LFTs within normal limits  Blood cultures not done UA was ordered but not done Drug screen ordered but not done  Today WBC 14.9/hemoglobin 12.5/hematocrit 37.8 Sodium 141/potassium 3.4/CO2 25/BUN 5/creatinine 0.68  Patient says she feels better than on admission, still nauseated but says the IV medications help she has had some dry heaves but no active vomiting today, no bowel movements.  She continues to complain of pain primarily in the right abdomen Having chills currently- last temp 99   Past Medical History:  Diagnosis Date   Anxiety    Arthritis    "joints ache all over" (10/15/2014)   Barrett's esophagus    Bulging lumbar disc    Chronic lower back pain    DDD (degenerative disc disease), cervical    Depression    DM (diabetes mellitus) ([place])    Drug-seeking behavior    Headache    "weekly" (10/15/2014)   Hyperlipemia    Hypertension    PTSD (post-traumatic stress disorder)    Skin cancer    "had them cut off my arms; don't know what kind"    Past Surgical History:  Procedure Laterality Date   ABLATION ON ENDOMETRIOSIS  2008  BIOPSY  12/27/2018   Procedure: BIOPSY;  Surgeon: Thornton Park, MD;  Location: [facility];  Service: Gastroenterology;;   BIOPSY  10/05/2021   Procedure: BIOPSY;  Surgeon: Gatha Mayer, MD;  Location: [facility] ENDOSCOPY;  Service: Gastroenterology;;   ESOPHAGOGASTRODUODENOSCOPY (EGD) WITH PROPOFOL N/A 12/27/2018   Procedure: ESOPHAGOGASTRODUODENOSCOPY (EGD) WITH PROPOFOL;  Surgeon: Thornton Park, MD;  Location: [facility] ENDOSCOPY;   Service: Gastroenterology;  Laterality: N/A;   ESOPHAGOGASTRODUODENOSCOPY (EGD) WITH PROPOFOL N/A 10/05/2021   Procedure: ESOPHAGOGASTRODUODENOSCOPY (EGD) WITH PROPOFOL;  Surgeon: Gatha Mayer, MD;  Location: [facility];  Service: Gastroenterology;  Laterality: N/A;   HEMORRHOID SURGERY  ~ 2002   IR FLUORO GUIDE CV LINE RIGHT  06/14/2020   IR REMOVAL TUN CV CATH W/O FL  06/23/2020   IR US GUIDE VASC ACCESS RIGHT  06/14/2020   ORIF ANKLE FRACTURE Right 03/28/2020   Procedure: OPEN REDUCTION INTERNAL FIXATION (ORIF) RIGHT BIMALLEOLAR ANKLE FRACTURE;  Surgeon: Marchia Bond, MD;  Location: [facility];  Service: Orthopedics;  Laterality: Right;    Prior to Admission medications   Medication Sig Start Date End Date Taking? Authorizing Provider  acetaminophen (TYLENOL) 500 MG tablet Take 1,000 mg by mouth as needed for mild pain or headache.   Yes [provider]  ARIPiprazole (ABILIFY) 5 MG tablet Take 1 tablet (5 mg total) by mouth daily. 08/01/20  Yes Jamse Arn, MD  atorvastatin (LIPITOR) 10 MG tablet Take 10 mg by mouth every evening. 08/26/20  Yes [provider]  FLUoxetine (PROZAC) 40 MG capsule Take 1 capsule (40 mg total) by mouth daily. 07/01/20  Yes Angiulli, Lavon Paganini, PA-C  levothyroxine (SYNTHROID) 50 MCG tablet Take 50 mcg by mouth daily before breakfast.   Yes [provider]  lidocaine (XYLOCAINE) 5 % ointment Apply 1 Application topically daily as needed for mild pain. 02/26/22  Yes [provider]  metFORMIN (GLUCOPHAGE) 500 MG tablet Take 500 mg by mouth 2 (two) times daily with a meal.   Yes [provider]  metoCLOPramide (REGLAN) 5 MG tablet Take 1 tablet (5 mg total) by mouth 3 (three) times daily before meals for 5 days. 04/27/22 05/21/22 Yes Antonieta Pert, MD  multivitamin (RENA-VIT) TABS tablet Take 1 tablet by mouth at bedtime. 08/01/20  Yes Jamse Arn, MD  ondansetron (ZOFRAN-ODT) 8 MG disintegrating tablet  Take 1 tablet (8 mg total) by mouth every 8 (eight) hours as needed for nausea or vomiting. Patient taking differently: Take 8 mg by mouth as needed for nausea or vomiting. 05/03/22  Yes Thornton Park, MD  OVER THE COUNTER MEDICATION Apply 1 Application topically as needed (itching). OTC Anti itch cream   Yes [provider]  oxyCODONE-acetaminophen (PERCOCET) 7.5-325 MG tablet Take 1 tablet by mouth 4 (four) times daily.   Yes [provider]  pantoprazole (PROTONIX) 40 MG tablet Take 1 tablet (40 mg total) by mouth 2 (two) times daily. 05/03/22 05/03/23 Yes Thornton Park, MD  prazosin (MINIPRESS) 2 MG capsule Take 1 capsule (2 mg total) by mouth at bedtime. 08/01/20  Yes Jamse Arn, MD  promethazine (PHENERGAN) 25 MG suppository Place 1 suppository (25 mg total) rectally every 6 (six) hours as needed for nausea or vomiting. Patient taking differently: Place 25 mg rectally 2 (two) times daily as needed for nausea or vomiting. 12/27/21  Yes Drenda Freeze, MD  promethazine (PHENERGAN) 25 MG tablet Take 25 mg by mouth as needed for nausea or vomiting.   Yes [provider]  tamsulosin (FLOMAX) 0.4 MG CAPS capsule Take 1 capsule (0.4 mg total) by mouth daily after breakfast. 04/28/22 05/28/22 Yes Kc, Maren Beach, MD  tiZANidine (ZANAFLEX) 4 MG tablet Take 3 tablets (12 mg total) by mouth every 8 (eight) hours as needed for up to 12 doses for muscle spasms. Patient taking differently: Take 12 mg by mouth 4 (four) times daily as needed for muscle spasms. 04/27/22  Yes Antonieta Pert, MD  traZODone (DESYREL) 50 MG tablet Take 3 tablets (150 mg total) by mouth at bedtime. 09/21/21  Yes Elodia Florence., MD  lactulose (CHRONULAC) 10 GM/15ML solution Take 30 mLs (20 g total) by mouth daily as needed for mild constipation. Patient not taking: Reported on 05/21/2022 04/27/22   Antonieta Pert, MD  omeprazole (PRILOSEC) 20 MG capsule Take 20 mg by mouth daily as needed (only if Pantoprazole  is not available). Patient not taking: Reported on 05/21/2022    [provider]    Current Facility-Administered Medications  Medication Dose Route Frequency Provider Last Rate Last Admin   ARIPiprazole (ABILIFY) tablet 5 mg  5 mg Oral Daily Crosley, Debby, MD   5 mg at 05/21/22 1011   Chlorhexidine Gluconate Cloth 2 % PADS 6 each  6 each Topical Daily Rai, Ripudeep K, MD   6 each at 05/21/22 1806   dextrose 5 % and 0.45 % NaCl with KCl 40 mEq/L infusion   Intravenous Continuous Rai, Ripudeep K, MD 100 mL/hr at 05/22/22 0300 New Bag at 05/22/22 0300   fentaNYL (SUBLIMAZE) injection 25 mcg  25 mcg Intravenous Q4H PRN Rai, Ripudeep K, MD       FLUoxetine (PROZAC) capsule 40 mg  40 mg Oral Daily Crosley, Debby, MD   40 mg at 05/21/22 1010   lactated ringers bolus 1,000 mL  1,000 mL Intravenous Once Rai, Ripudeep K, MD       levothyroxine (SYNTHROID) tablet 50 mcg  50 mcg Oral Q0600 Crosley, Debby, MD   50 mcg at 05/21/22 0636   LORazepam (ATIVAN) injection 0.5 mg  0.5 mg Intravenous Q4H PRN Rai, Ripudeep K, MD   0.5 mg at 05/21/22 1957   methocarbamol (ROBAXIN) 500 mg in dextrose 5 % 50 mL IVPB  500 mg Intravenous Q6H PRN Rai, Ripudeep K, MD 110 mL/hr at 05/22/22 0800 500 mg at 05/22/22 0800   metoCLOPramide (REGLAN) injection 10 mg  10 mg Intravenous Q6H Rai, Ripudeep K, MD   10 mg at 05/22/22 0522   metoprolol tartrate (LOPRESSOR) injection 5 mg  5 mg Intravenous Q6H Rai, Ripudeep K, MD   5 mg at 05/22/22 0522   oxyCODONE-acetaminophen (PERCOCET) 7.5-325 MG per tablet 1 tablet  1 tablet Oral QID Crosley, Debby, MD   1 tablet at 05/21/22 2116   pantoprazole (PROTONIX) injection 40 mg  40 mg Intravenous Q12H Rai, Ripudeep K, MD   40 mg at 05/21/22 2116   pneumococcal 20-valent conjugate vaccine (PREVNAR 20) injection 0.5 mL  0.5 mL Intramuscular Tomorrow-1000 Rai, Ripudeep K, MD       prazosin (MINIPRESS) capsule 2 mg  2 mg Oral QHS Crosley, Debby, MD   2 mg at 05/21/22 2117    prochlorperazine (COMPAZINE) injection 5 mg  5 mg Intravenous Q4H PRN Crosley, Debby, MD       sodium chloride flush (NS) 0.9 % injection 10-40 mL  10-40 mL Intracatheter Q12H Rai, Ripudeep K, MD   10 mL at 05/21/22 2116   sodium chloride flush (NS) 0.9 %  injection 10-40 mL  10-40 mL Intracatheter PRN Rai, Ripudeep K, MD       sodium chloride flush (NS) 0.9 % injection 10-40 mL  10-40 mL Intracatheter PRN Rai, Ripudeep K, MD       traZODone (DESYREL) tablet 150 mg  150 mg Oral QHS Crosley, Debby, MD   150 mg at 05/21/22 2128    Allergies as of 05/20/2022   (No Known Allergies)    Family History  Problem Relation Age of Onset   Breast cancer Mother    Diabetes Mother    Breast cancer Maternal Grandmother    Breast cancer Paternal Grandmother    Colon polyps Paternal Grandmother    Colon cancer Neg Hx    Esophageal cancer Neg Hx    Rectal cancer Neg Hx    Stomach cancer Neg Hx     Social History   Socioeconomic History   Marital status: Married    Spouse name: Not on file   Number of children: Not on file   Years of education: Not on file   Highest education level: Not on file  Occupational History   Occupation: unemployed  Tobacco Use   Smoking status: Never   Smokeless tobacco: Never  Vaping Use   Vaping Use: Never used  Substance and Sexual Activity   Alcohol use: Not Currently    Comment: 10/15/2014 "I've drank before; nothing regular; don't drink now cause of RX I'm on"   Drug use: No   Sexual activity: Not Currently  Other Topics Concern   Not on file  Social History Narrative   [place]. Never smoked; quit alcohol 10 years; used to work for [employer]. NO IV drug abuse.    Social Determinants of Health   Financial Resource Strain: Not on file  Food Insecurity: No Food Insecurity (05/21/2022)   Hunger Vital Sign    Worried About Running Out of Food in the Last Year: Never true    Ran Out of Food in the Last  Year: Never true  Transportation Needs: No Transportation Needs (05/21/2022)   PRAPARE - Hydrologist (Medical): No    Lack of Transportation (Non-Medical): No  Physical Activity: Not on file  Stress: Not on file  Social Connections: Not on file  Intimate Partner Violence: Not At Risk (05/21/2022)   Humiliation, Afraid, Rape, and Kick questionnaire    Fear of Current or Ex-Partner: No    Emotionally Abused: No    Physically Abused: No    Sexually Abused: No    Review of Systems: Pertinent positive and negative review of systems were noted in the above HPI section.  All other review of systems was otherwise negative.   Physical Exam: Vital signs in last 24 hours: Temp:  [98 F (36.7 C)-99.7 F (37.6 C)] 99.3 F (37.4 C) (01/30 0743) Pulse Rate:  [104-144] 120 (01/30 0743) Resp:  [14-20] 20 (01/30 0743) BP: (139-187)/(70-107) 178/96 (01/30 0743) SpO2:  [90 %-97 %] 96 % (01/30 0743) Last BM Date : 05/19/22 General:   Alert,  Well-developed, acute and chronically ill-appearing white female,cooperative in NAD, affect flat, mild rigors currently/chills Head:  Normocephalic and atraumatic. Eyes:  Sclera clear, no icterus.   Conjunctiva pink. Ears:  Normal auditory acuity. Nose:  No deformity, discharge,  or lesions. Mouth:  No deformity or lesions.   Neck:  Supple; no masses or thyromegaly. Lungs:  Clear throughout to auscultation.   No wheezes,  crackles, or rhonchi. Heart:  Regular rate and rhythm; no murmurs, clicks, rubs,  or gallops. Abdomen:  Soft, is is tender in the right mid quadrant no guarding or rebound BS active,nonpalp mass or hsm.   Rectal: Not done Msk:  Symmetrical without gross deformities. . Pulses:  Normal pulses noted. Extremities:  Without clubbing or edema. Neurologic:  Alert and  oriented x4;  grossly normal neurologically. Skin:  Intact, she does have a fine macular rash on her upper back, small bruises and erythema left upper  arm, rash under the right axilla Psych:  Alert and cooperative. Normal mood and affect.  Intake/Output from previous day: 01/29 0701 - 01/30 0700 In: 2421.3 [I.V.:1143; IV Piggyback:1278.3] Out: 300 [Urine:300] Intake/Output this shift: No intake/output data recorded.  Lab Results: Recent Labs    05/20/22 2354 05/21/22 1810 05/22/22 0630  WBC 18.1* 18.6* 14.9*  HGB 17.0* 13.2 12.5  HCT 52.1* 39.7 37.8  PLT 491* 389 303   BMET Recent Labs    05/20/22 2354 05/21/22 1810 05/22/22 0630  NA 144 146* 141  K 3.2* 3.1* 3.4*  CL 103 107 106  CO2 20* 24 25  GLUCOSE 138* 135* 141*  BUN <5* <5* <5*  CREATININE 0.94 0.74 0.68  CALCIUM 9.7 8.9 8.7*   LFT Recent Labs    05/21/22 1810  PROT 6.7  ALBUMIN 3.1*  AST 38  ALT 27  ALKPHOS 72  BILITOT 0.8   PT/INR No results for input(s): "LABPROT", "INR" in the last 72 hours. Hepatitis Panel No results for input(s): "HEPBSAG", "HCVAB", "HEPAIGM", "HEPBIGM" in the last 72 hours.    IMPRESSION:  #110 52 year old female with history of what is felt to be a cyclic vomiting syndrome with chronic complaints of nausea and vomiting, in setting of chronic narcotic use Extensive prior workup and frequent ER visits for same Presented back to the emergency room on 05/20/2022 with 8-hour history of nausea and vomiting, abdominal pain no diarrhea  She had an admission earlier this month with similar complaints and had an Acinetobacter bacteremia/SIRS-treated with Unasyn and then to complete a course of Bactrim at home Her history of not sure if she completed the Bactrim as she says she was still taking Bactrim this will past weekend  CT on admission negative  Patient does have evidence of SIRS this admission, with hypertension/tachycardia on admission and metabolic acidosis likely lactic acidosis Etiology not clear this is similar to her presentation earlier this month Need rule out a recurrent bacteremia  #2 diabetes mellitus-on  Glucophage at home #3 chronic pain syndrome-chronic narcotic use #4 hypokalemia resolved #5 bipolar affective disorder #6 history of anoxic brain injury/TBI #7 prolonged QT admit  PLAN: Will check upper abdominal ultrasound due to continued complaints of right-sided pain, no evidence of cholelithiasis on CT Clear liquids after ultrasound UA this a.m., drug screen this a.m. Blood cultures x 2 IV PPI once daily Continue IV antiemetics as needed  I would favor starting IV antibiotic empirically, will discuss with hospitalist  GI will follow with you    Amy Esterwood PA-C 05/22/2022, 9:00 AM   GI ATTENDING  History, laboratories, x-rays, ancillary data reviewed.  Patient seen and examined.  Agree with comprehensive consultation note as outlined above.  Complicated patient as outlined.  Agree with impression and plans as outlined without additions or deletions.  Will follow.  Docia Chuck. Geri Seminole., M.D. [facility] Division of Gastroenterology

## 2022-05-22 NOTE — Progress Notes (Signed)
Pharmacy Antibiotic Note  [name] is a 52 y.o. female admitted on 05/20/2022 with intractable nausea and vomiting. Admission earlier this month with Acinetobacter bacteremia treated with Unasyn and discharged on Bactrim. Pharmacy has been consulted for vancomycin and cefepime dosing for sepsis.  Plan: -Vancomycin 2 g then 1500 mg IV q24h -Cefepime 2 g IV q8h -Continue to follow renal function, cultures and clinical progress for dose adjustments and de-escalation as indicated  Height: 5' 4.5" (163.8 cm) Weight: 86.2 kg (190 lb) IBW/kg (Calculated) : 55.85  Temp (24hrs), Avg:99 F (37.2 C), Min:98 F (36.7 C), Max:99.7 F (37.6 C)  Recent Labs  Lab 05/20/22 2354 05/21/22 0846 05/21/22 1810 05/22/22 0630  WBC 18.1*  --  18.6* 14.9*  CREATININE 0.94  --  0.74 0.68  LATICACIDVEN  --  7.2* 2.9*  --     Estimated Creatinine Clearance: 89.3 mL/min (by C-G formula based on SCr of 0.68 mg/dL).    No Known Allergies  Antimicrobials this admission: Cefepime 1/30 >> Vancomycin 1/30 >>  Dose adjustments this admission: NA  Microbiology results: 1/30 BCx: pending   Thank you for allowing pharmacy to be a part of this patient's care.  Tawnya Crook, PharmD, BCPS Clinical Pharmacist 05/22/2022 1:06 PM

## 2022-05-23 ENCOUNTER — Encounter: Payer: Self-pay | Admitting: Gastroenterology

## 2022-05-23 DIAGNOSIS — R6510 Systemic inflammatory response syndrome (SIRS) of non-infectious origin without acute organ dysfunction: Secondary | ICD-10-CM | POA: Diagnosis not present

## 2022-05-23 DIAGNOSIS — R1031 Right lower quadrant pain: Secondary | ICD-10-CM

## 2022-05-23 LAB — BASIC METABOLIC PANEL
Anion gap: 10 (ref 5–15)
BUN: <5 mg/dL — ABNORMAL LOW (ref 6–20)
CO2: 23 mmol/L (ref 22–32)
Calcium: 8.4 mg/dL — ABNORMAL LOW (ref 8.9–10.3)
Chloride: 107 mmol/L (ref 98–111)
Creatinine, Ser: 0.67 mg/dL (ref 0.44–1.00)
GFR, Estimated: >60 mL/min (ref >60)
Glucose, Bld: 130 mg/dL — ABNORMAL HIGH (ref 70–99)
Potassium: 4 mmol/L (ref 3.5–5.1)
Sodium: 140 mmol/L (ref 135–145)

## 2022-05-23 LAB — CBC
HCT: 35.5 % — ABNORMAL LOW (ref 36.0–46.0)
Hemoglobin: 11.4 g/dL — ABNORMAL LOW (ref 12.0–15.0)
MCH: 30.2 pg (ref 26.0–34.0)
MCHC: 32.1 g/dL (ref 30.0–36.0)
MCV: 94.2 fL (ref 80.0–100.0)
Platelets: 249 10*3/uL (ref 150–400)
RBC: 3.77 MIL/uL — ABNORMAL LOW (ref 3.87–5.11)
RDW: 14.5 % (ref 11.5–15.5)
WBC: 12.5 10*3/uL — ABNORMAL HIGH (ref 4.0–10.5)
nRBC: 0 % (ref 0.0–0.2)

## 2022-05-23 LAB — GLUCOSE, CAPILLARY
Glucose-Capillary: 113 mg/dL — ABNORMAL HIGH (ref 70–99)
Glucose-Capillary: 116 mg/dL — ABNORMAL HIGH (ref 70–99)
Glucose-Capillary: 138 mg/dL — ABNORMAL HIGH (ref 70–99)
Glucose-Capillary: 112 mg/dL — ABNORMAL HIGH (ref 70–99)

## 2022-05-23 MED ORDER — TIZANIDINE HCL 4 MG PO TABS
8.0000 mg | ORAL_TABLET | Freq: Three times a day (TID) | ORAL | Status: DC | PRN
  Administered 2022-05-23 – 2022-05-24 (×3): 8 mg via ORAL
  Filled 2022-05-23 (×3): qty 2

## 2022-05-23 MED ORDER — SODIUM CHLORIDE 0.9 % IV BOLUS
250.0000 mL | Freq: Once | INTRAVENOUS | Status: AC
  Administered 2022-05-23: 250 mL via INTRAVENOUS

## 2022-05-23 MED ORDER — RENA-VITE PO TABS
1.0000 | ORAL_TABLET | Freq: Every day | ORAL | Status: DC
  Administered 2022-05-23: 1 via ORAL
  Filled 2022-05-23 (×2): qty 1

## 2022-05-23 NOTE — Progress Notes (Signed)
PROGRESS NOTE    [name]  DVV:[id number] DOB: [birth year] DOA: 05/20/2022 PCP: Ferd Hibbs, NP   Brief Narrative:  This 52 year old female with diabetes mellitus type 2, hypothyroidism, bipolar disorder, PTSD, cyclical vomiting syndrome, HTN, prior history of cardiac arrest with MI in 06/2020 which led to anoxic brain injury (believed secondary to opiate overdose in February, UDS was positive for amphetamines).  Patient has had multiple admissions with similar complaints, last admission 04/23/2022-04/27/2022. Presented with nausea,  vomiting that started a day before the admission, abdominal pain in the right upper quadrant.  Denied any hematemesis or diarrhea.  Denied dizziness or lightheadedness. Patient stated that she had taken Zofran, Reglan and Protonix without improvement.  She ran out of Phenergan oral and suppository. She had EGD on 10/05/2021 which showed small hiatal hernia and patchy moderate inflammation characterized by edema, erythema in the gastric antrum. CT abdomen pelvis negative for acute abdominal pathology.  Patient was admitted for further workup.   Assessment & Plan:   Principal Problem:   SIRS (systemic inflammatory response syndrome) (HCC) Active Problems:   Substance abuse (HCC)   Bipolar affective disorder (HCC)   Essential hypertension   Tachycardia   T2DM (type 2 diabetes mellitus) (HCC)   Hyperlipidemia   Chronic pain syndrome   Hypothyroidism   Hypokalemia   Lactic acidosis   Intractable vomiting   Cyclical vomiting syndrome  SIRS, Lactic acidosis, Intractable Nausea and Vomiting: Cyclical vomiting syndrome: PICC line placed for IV access, continue IV fluids, pain control. CT A/P showed no acute abdominal pathology.  Lipase 23. Continues to have intractable nausea and vomiting. Low-grade fevers, chills, procalcitonin less than 0.1, still has leukocytosis, although improving. Recent admission with Acinetobacter bacteremia, Follow up  cultures Continue scheduled IV Phenergan x 4 doses, as needed Compazine, IM tigan For now placed on IV vancomycin and Rocephin empirically Abdominal US : Fatty liver. Advance diet as tolerated.    High anion gap metabolic acidosis > Resolved. Likely due to lactic acidosis, metformin use, unclear of ingestion of illicit substance UDS  Amphetamine+, Opiods+, Benzo+ Lactic acid improving, continue IV fluid hydration until tolerating diet   Substance abuse (HCC) UDS: Amphetamine+, Opoids+, Benzo+. Social worker consult.   Bipolar affective disorder ([place]) Continue Abilify, Prozac   Hypertensive urgency: Continue scheduled IV Lopressor,  Continue  IV hydralazine as needed with parameters Continue to monitor BP.   DM II: Continue to hold metformin Placed on sensitive sliding scale insulin. Obtain Hb A1c    Hyperlipidemia: Hold statin:   Chronic pain syndrome: Continue pain medications   Hypothyroidism: Continue Synthroid, TSH 0.4   Hypokalemia: Replaced. Continue to monitor.   Prolonged Qtc: QTc 537 on admission DC IV Reglan, Prozac Repeat EKG in a.m.    Obesity Estimated body mass index is 32.11 kg/m as calculated from the following:   Height as of this encounter: 5' 4.5" (1.638 m).   Weight as of this encounter: 86.2 kg.  DVT prophylaxis: SCDs Code Status:Full code. Family Communication: No family at bed side. Disposition Plan:   Status is: Inpatient Remains inpatient appropriate because: Admitted for intractable nausea and vomiting precludes safe discharge   Consultants:  Gastroenterology  Procedures: None  Antimicrobials: Anti-infectives (From admission, onward)    Start     Dose/Rate Route Frequency Ordered Stop   05/23/22 1500  vancomycin (VANCOREADY) IVPB 1500 mg/300 mL        1,500 mg 150 mL/hr over 120 Minutes Intravenous Every 24 hours 05/22/22 1113  05/22/22 1500  ceFEPIme (MAXIPIME) 2 g in sodium chloride 0.9 % 100 mL IVPB        2 g 200  mL/hr over 30 Minutes Intravenous Every 8 hours 05/22/22 1243     05/22/22 1500  vancomycin (VANCOREADY) IVPB 2000 mg/400 mL        2,000 mg 200 mL/hr over 120 Minutes Intravenous  Once 05/22/22 1246 05/22/22 1815   05/22/22 1200  vancomycin (VANCOREADY) IVPB 2000 mg/400 mL  Status:  Discontinued        2,000 mg 200 mL/hr over 120 Minutes Intravenous  Once 05/22/22 1108 05/22/22 1246   05/22/22 1200  ceFEPIme (MAXIPIME) 2 g in sodium chloride 0.9 % 100 mL IVPB  Status:  Discontinued        2 g 200 mL/hr over 30 Minutes Intravenous Every 8 hours 05/22/22 1108 05/22/22 1109   05/22/22 1200  ceFEPIme (MAXIPIME) 2 g in sodium chloride 0.9 % 100 mL IVPB  Status:  Discontinued        2 g 200 mL/hr over 30 Minutes Intravenous Every 8 hours 05/22/22 1109 05/22/22 1243       Subjective: Patient was seen and examined at bedside.  Overnight events noted.   Patient reports still having nausea and vomiting ,  still reports abdominal soreness.  Objective: Vitals:   05/22/22 2041 05/23/22 0017 05/23/22 0614 05/23/22 1343  BP: (!) 166/100 130/74 (!) 167/94 (!) 198/94  Pulse:  (!) 105 100 97  Resp:   17 19  Temp: 98.6 F (37 C) 98.4 F (36.9 C) 97.8 F (36.6 C) 98 F (36.7 C)  TempSrc: Oral Oral Oral Oral  SpO2:  97% 100% 98%  Weight:      Height:        Intake/Output Summary (Last 24 hours) at 05/23/2022 1350 Last data filed at 05/23/2022 1335 Gross per 24 hour  Intake 2767.66 ml  Output 1200 ml  Net 1567.66 ml   Filed Weights   05/20/22 2347  Weight: 86.2 kg    Examination:  General exam: Appears calm and comfortable, not in any acute distress. Respiratory system: Clear to auscultation. Respiratory effort normal.  RR 15 Cardiovascular system: S1 & S2 heard, regular rate and rhythm, no murmur. Gastrointestinal system: Abdomen is soft, nontender, nondistended, BS+ Central nervous system: Alert and oriented. No focal neurological deficits. Extremities: Symmetric 5 x 5  power. Skin: No rashes, lesions or ulcers Psychiatry: Judgement and insight appear normal. Mood & affect appropriate.     Data Reviewed: I have personally reviewed following labs and imaging studies  CBC: Recent Labs  Lab 05/20/22 2354 05/21/22 1810 05/22/22 0630 05/23/22 0222  WBC 18.1* 18.6* 14.9* 12.5*  NEUTROABS 14.8*  --   --   --   HGB 17.0* 13.2 12.5 11.4*  HCT 52.1* 39.7 37.8 35.5*  MCV 92.2 92.3 92.6 94.2  PLT 491* 389 303 785   Basic Metabolic Panel: Recent Labs  Lab 05/20/22 2354 05/21/22 1810 05/22/22 0630 05/23/22 0222  NA 144 146* 141 140  K 3.2* 3.1* 3.4* 4.0  CL 103 107 106 107  CO2 20* '24 25 23  '$ GLUCOSE 138* 135* 141* 130*  BUN <5* <5* <5* <5*  CREATININE 0.94 0.74 0.68 0.67  CALCIUM 9.7 8.9 8.7* 8.4*  MG  --  1.7  --   --    GFR: Estimated Creatinine Clearance: 89.3 mL/min (by C-G formula based on SCr of 0.67 mg/dL). Liver Function Tests: Recent Labs  Lab 05/21/22  1810  AST 38  ALT 27  ALKPHOS 72  BILITOT 0.8  PROT 6.7  ALBUMIN 3.1*   Recent Labs  Lab 05/21/22 1810  LIPASE 23   No results for input(s): "AMMONIA" in the last 168 hours. Coagulation Profile: No results for input(s): "INR", "PROTIME" in the last 168 hours. Cardiac Enzymes: No results for input(s): "CKTOTAL", "CKMB", "CKMBINDEX", "TROPONINI" in the last 168 hours. BNP (last 3 results) No results for input(s): "PROBNP" in the last 8760 hours. HbA1C: Recent Labs    05/22/22 0630  HGBA1C 5.3   CBG: Recent Labs  Lab 05/20/22 2346 05/22/22 1703 05/22/22 2033 05/23/22 0737 05/23/22 1153  GLUCAP 147* 129* 138* 113* 116*   Lipid Profile: No results for input(s): "CHOL", "HDL", "LDLCALC", "TRIG", "CHOLHDL", "LDLDIRECT" in the last 72 hours. Thyroid Function Tests: Recent Labs    05/21/22 1810  TSH 0.414   Anemia Panel: No results for input(s): "VITAMINB12", "FOLATE", "FERRITIN", "TIBC", "IRON", "RETICCTPCT" in the last 72 hours. Sepsis Labs: Recent Labs   Lab 05/21/22 0846 05/21/22 1810 05/22/22 1340  PROCALCITON  --  <0.10 <0.10  LATICACIDVEN 7.2* 2.9*  --     Recent Results (from the past 240 hour(s))  Culture, blood (Routine X 2) w Reflex to ID Panel     Status: None (Preliminary result)   Collection Time: 05/22/22 12:33 PM   Specimen: BLOOD LEFT ARM  Result Value Ref Range Status   Specimen Description   Final    BLOOD LEFT ARM Performed at [facility] [address] [place], [place] [address]    Special Requests   Final    BOTTLES DRAWN AEROBIC AND ANAEROBIC Blood Culture adequate volume Performed at [facility] [address], [place], [place] [address]    Culture   Final    NO GROWTH < 24 HOURS Performed at [facility] [address], [place], [place] [address]    Report Status PENDING  Incomplete  Culture, blood (Routine X 2) w Reflex to ID Panel     Status: None (Preliminary result)   Collection Time: 05/22/22 12:40 PM   Specimen: BLOOD LEFT HAND  Result Value Ref Range Status   Specimen Description   Final    BLOOD LEFT HAND Performed at [facility], [place] [address], [place], [place] [address]    Special Requests   Final    BOTTLES DRAWN AEROBIC ONLY Blood Culture adequate volume Performed at [facility] [address] [place], [place] [address]    Culture   Final    NO GROWTH < 24 HOURS Performed at [facility] [address], [place], [place] [address]    Report Status PENDING  Incomplete    Radiology Studies: US Abdomen Complete  Result Date: 05/22/2022 CLINICAL DATA:  Abdominal pain, nausea, vomiting. EXAM: ABDOMEN ULTRASOUND COMPLETE COMPARISON:  CT scan May 21, 2022. FINDINGS: Gallbladder: No gallstones or wall thickening visualized. No sonographic Murphy sign noted by sonographer. Common bile duct: Diameter: 3 mm which is within normal limits. Liver: No focal lesion identified. Increased echogenicity of hepatic parenchyma is  noted suggesting hepatic steatosis. Portal vein is patent on color Doppler imaging with normal direction of blood flow towards the liver. IVC: No abnormality visualized. Pancreas: Not well visualized due to overlying bowel gas. Spleen: Size and appearance within normal limits. Right Kidney: Length: 9.4 cm. Echogenicity within normal limits. No mass or hydronephrosis visualized. Left Kidney: Length: 9.1 cm.  Echogenicity within normal limits. No mass or hydronephrosis visualized. Abdominal aorta: No aneurysm visualized. Other findings: None. IMPRESSION: Increased echogenicity of hepatic parenchyma is noted suggesting hepatic steatosis. No other definite abnormality seen in the abdomen. Electronically Signed   By: Marijo Conception M.D.   On: 05/22/2022 14:44   [place] EKG SITE RITE  Result Date: 05/21/2022 If Site Rite image not attached, placement could not be confirmed due to current cardiac rhythm.   Scheduled Meds:  ARIPiprazole  5 mg Oral Daily   Chlorhexidine Gluconate Cloth  6 each Topical Daily   insulin aspart  0-9 Units Subcutaneous TID WC   levothyroxine  50 mcg Oral Q0600   metoprolol tartrate  5 mg Intravenous Q6H   multivitamin  1 tablet Oral QHS   oxyCODONE-acetaminophen  1 tablet Oral QID   pantoprazole (PROTONIX) IV  40 mg Intravenous Q12H   pneumococcal 20-valent conjugate vaccine  0.5 mL Intramuscular Tomorrow-1000   prazosin  2 mg Oral QHS   sodium chloride flush  10-40 mL Intracatheter Q12H   traZODone  150 mg Oral QHS   Continuous Infusions:  ceFEPime (MAXIPIME) IV 2 g (05/23/22 1330)   dextrose 5 % and 0.45 % NaCl with KCl 40 mEq/L 100 mL/hr at 05/23/22 1157   lactated ringers     vancomycin       LOS: 2 days    Time spent: 50 mins    Jakeb Lamping, MD [facility]   If 7PM-7AM, please contact night-coverage

## 2022-05-23 NOTE — Progress Notes (Addendum)
Patient ID: [name], female   DOB: 1970/11/07, 52 y.o.   MRN: [id number] .   Progress Note   Subjective   Day # 3  CC; acute nausea vomiting and abdominal pain in setting of chronic issues with nausea, probable cyclic vomiting and chronic pain syndrome  IV Vanco/cefepime  Abdominal ultrasound-no gallstones or gallbladder wall thickening, CBD 3 mm, hepatic steatosis present  Labs-WBC 12.5/hemoglobin 11.4/hematocrit 35.5 Calcium 4.0/BUN 5/creatinine 0.67 UA negative Blood cultures negative less than 24 hours Drug screen positive for opiates benzodiazepines and amphetamines  Tmax 99 3  Patient looks better today, more interactive, says she feels better.  Nausea is controlled still does not have an appetite, trying clear liquids  No vomiting, no bowel movements, her abdominal pain is about at its baseline   Objective   Vital signs in last 24 hours: Temp:  [97.8 F (36.6 C)-99.3 F (37.4 C)] 97.8 F (36.6 C) (01/31 4696) Pulse Rate:  [95-122] 100 (01/31 0614) Resp:  [17-20] 17 (01/31 2952) BP: (130-186)/(74-104) 167/94 (01/31 8413) SpO2:  [92 %-100 %] 100 % (01/31 0614) Last BM Date : 05/19/22 General:    white female in NAD Heart:  Regular rate and rhythm; no murmurs Lungs: Respirations even and unlabored, lungs CTA bilaterally Abdomen:  Soft, Normal bowel sounds mild tenderness across the lower abdomen more so in the right mid/right lower no guarding or rebound Extremities:  Without edema. Neurologic:  Alert and oriented,  grossly normal neurologically. Psych:  Cooperative. Normal mood and affect.  Intake/Output from previous day: 01/30 0701 - 01/31 0700 In: 2527.7 [P.O.:240; I.V.:1883.5; IV Piggyback:404.2] Out: 400 [Urine:400] Intake/Output this shift: No intake/output data recorded.  Lab Results: Recent Labs    05/21/22 1810 05/22/22 0630 05/23/22 0222  WBC 18.6* 14.9* 12.5*  HGB 13.2 12.5 11.4*  HCT 39.7 37.8 35.5*  PLT 389 303 249   BMET Recent  Labs    05/21/22 1810 05/22/22 0630 05/23/22 0222  NA 146* 141 140  K 3.1* 3.4* 4.0  CL 107 106 107  CO2 '24 25 23  '$ GLUCOSE 135* 141* 130*  BUN <5* <5* <5*  CREATININE 0.74 0.68 0.67  CALCIUM 8.9 8.7* 8.4*   LFT Recent Labs    05/21/22 1810  PROT 6.7  ALBUMIN 3.1*  AST 38  ALT 27  ALKPHOS 72  BILITOT 0.8   PT/INR No results for input(s): "LABPROT", "INR" in the last 72 hours.  Studies/Results: US Abdomen Complete  Result Date: 05/22/2022 CLINICAL DATA:  Abdominal pain, nausea, vomiting. EXAM: ABDOMEN ULTRASOUND COMPLETE COMPARISON:  CT scan May 21, 2022. FINDINGS: Gallbladder: No gallstones or wall thickening visualized. No sonographic Murphy sign noted by sonographer. Common bile duct: Diameter: 3 mm which is within normal limits. Liver: No focal lesion identified. Increased echogenicity of hepatic parenchyma is noted suggesting hepatic steatosis. Portal vein is patent on color Doppler imaging with normal direction of blood flow towards the liver. IVC: No abnormality visualized. Pancreas: Not well visualized due to overlying bowel gas. Spleen: Size and appearance within normal limits. Right Kidney: Length: 9.4 cm. Echogenicity within normal limits. No mass or hydronephrosis visualized. Left Kidney: Length: 9.1 cm. Echogenicity within normal limits. No mass or hydronephrosis visualized. Abdominal aorta: No aneurysm visualized. Other findings: None. IMPRESSION: Increased echogenicity of hepatic parenchyma is noted suggesting hepatic steatosis. No other definite abnormality seen in the abdomen. Electronically Signed   By: Marijo Conception M.D.   On: 05/22/2022 14:44   [place] EKG SITE RITE  Result Date: 05/21/2022 If Site Rite image not attached, placement could not be confirmed due to current cardiac rhythm.      Assessment / Plan:    #82 52 year old female with history of what is felt to be cyclic vomiting, chronic complaints of nausea and intermittent vomiting in setting of  chronic narcotic use Extensive prior GI workups and frequent ER visits for same  Then had back to the emergency room 05/20/2022 with 8-hour history of ongoing nausea vomiting abdominal pain-no diarrhea Admission earlier this month with similar symptoms and met criteria for SIRS and found to have an Acinetobacter bacteremia-treated with course of Unasyn and then to complete Bactrim at home-not clear by her history that she completed the Bactrim.  CT this admission negative-multiple CTs over the past 6 months  Did meet criteria for SIRS with hypertension, tachycardia and metabolic acidosis likely lactic acidosis definitely elevated lactate Concern for recurrent bacteremia  #2 diabetes mellitus-on Glucophage at home #3 chronic pain syndrome-chronic narcotic use #4 hypokalemia resolved #5 bipolar affective disorder and history of anoxic brain injury/TBI #6 prolonged QT on admit-QTc 454 today   Plan;  Advance to full liquids Await final blood cultures and urine culture Continue IV PPI daily Continue IV antiemetics as needed-she usually alternates oral Phenergan and Zofran and ODT at home and would aim to resume this regimen when she is taking p.o.'s  Continue IV antibiotics Patient is asking about discharge-she is not ready for discharge today.  She says that the muscle relaxer for her back does not well through the IV asks for change back to Zanaflex orally-order changed.      Principal Problem:   SIRS (systemic inflammatory response syndrome) (HCC) Active Problems:   Lactic acidosis   Hypokalemia   Tachycardia   Bipolar affective disorder (HCC)   Essential hypertension   Substance abuse (HCC)   Hyperlipidemia   Intractable vomiting   T2DM (type 2 diabetes mellitus) (HCC)   Cyclical vomiting syndrome   Chronic pain syndrome   Hypothyroidism     LOS: 2 days   Amy Esterwood PA-C 05/23/2022, 8:45 AM  gi ATTENDING  Interval history data reviewed.  Patient seen and  examined.  Agree with interval progress note as outlined above.  No additional delusions to the assessment and plan.  No further recommendations or plans from GI standpoint.  Ongoing care with primary service. Will sign off.  Docia Chuck. Geri Semin[place]., M.D. [facility]

## 2022-05-23 NOTE — Plan of Care (Signed)

## 2022-05-24 DIAGNOSIS — R6510 Systemic inflammatory response syndrome (SIRS) of non-infectious origin without acute organ dysfunction: Secondary | ICD-10-CM | POA: Diagnosis not present

## 2022-05-24 LAB — CBC
HCT: 33.4 % — ABNORMAL LOW (ref 36.0–46.0)
Hemoglobin: 10.5 g/dL — ABNORMAL LOW (ref 12.0–15.0)
MCH: 30.4 pg (ref 26.0–34.0)
MCHC: 31.4 g/dL (ref 30.0–36.0)
MCV: 96.8 fL (ref 80.0–100.0)
Platelets: 207 10*3/uL (ref 150–400)
RBC: 3.45 MIL/uL — ABNORMAL LOW (ref 3.87–5.11)
RDW: 14.6 % (ref 11.5–15.5)
WBC: 7.7 10*3/uL (ref 4.0–10.5)
nRBC: 0 % (ref 0.0–0.2)

## 2022-05-24 LAB — COMPREHENSIVE METABOLIC PANEL
ALT: 24 U/L (ref 0–44)
AST: 32 U/L (ref 15–41)
Albumin: 2.3 g/dL — ABNORMAL LOW (ref 3.5–5.0)
Alkaline Phosphatase: 46 U/L (ref 38–126)
Anion gap: 6 (ref 5–15)
BUN: 6 mg/dL (ref 6–20)
CO2: 23 mmol/L (ref 22–32)
Calcium: 8.1 mg/dL — ABNORMAL LOW (ref 8.9–10.3)
Chloride: 111 mmol/L (ref 98–111)
Creatinine, Ser: 0.7 mg/dL (ref 0.44–1.00)
GFR, Estimated: >60 mL/min (ref >60)
Glucose, Bld: 118 mg/dL — ABNORMAL HIGH (ref 70–99)
Potassium: 4.4 mmol/L (ref 3.5–5.1)
Sodium: 140 mmol/L (ref 135–145)
Total Bilirubin: 0.7 mg/dL (ref 0.3–1.2)
Total Protein: 5.6 g/dL — ABNORMAL LOW (ref 6.5–8.1)

## 2022-05-24 LAB — GLUCOSE, CAPILLARY
Glucose-Capillary: 143 mg/dL — ABNORMAL HIGH (ref 70–99)
Glucose-Capillary: 115 mg/dL — ABNORMAL HIGH (ref 70–99)

## 2022-05-24 LAB — MAGNESIUM: Magnesium: 1.8 mg/dL (ref 1.7–2.4)

## 2022-05-24 LAB — PHOSPHORUS: Phosphorus: 2.4 mg/dL — ABNORMAL LOW (ref 2.5–4.6)

## 2022-05-24 NOTE — Progress Notes (Signed)
Patient discharged home.  Discharge instructions explained, patient verbalized understanding.

## 2022-05-24 NOTE — Discharge Summary (Signed)
Physician Discharge Summary  CATALEIA GADE ZOX:[id number] DOB: Mar 16, 1971 DOA: 05/20/2022  PCP: Ferd Hibbs, NP  Admit date: 05/20/2022  Discharge date: 05/24/2022  Admitted From: Home.  Disposition:  Home.  Recommendations for Outpatient Follow-up:  Follow up with PCP in 1-2 weeks. Please obtain BMP/CBC in one week. Advised to follow-up with Gastroenterology as scheduled. Advised to continue current medications as prescribed.  Blood cultures has been negative.  Home Health: None Equipment/Devices: None  Discharge Condition: Stable CODE STATUS: Full code Diet recommendation: Heart Healthy  Brief Summary / Hospital Course: This 52 year old female with PMH significant for diabetes mellitus type 2, hypothyroidism, bipolar disorder, PTSD, cyclical vomiting syndrome, HTN, prior history of cardiac arrest with MI in 06/2020 which led to anoxic brain injury (believed secondary to opiate overdose in February, UDS was positive for amphetamines). Patient has had multiple admissions with similar complaints, last admission was on 04/23/2022-04/27/2022. Presented with nausea, vomiting that started a day before the admission, abdominal pain in the right upper quadrant.  Denied any hematemesis or diarrhea. Denied dizziness or lightheadedness.Patient stated that she had taken Zofran, Reglan and Protonix without improvement. She ran out of Phenergan oral and suppository. She had EGD on 10/05/2021 which showed small hiatal hernia and patchy moderate inflammation characterized by edema, erythema in the gastric antrum. CT abdomen pelvis negative for acute abdominal pathology. Patient is admitted for further workup.  Gastroenterology was consulted.  Recommended to continue IV antibiotics and continue supportive care.  No plans for any endoscopic intervention needed at this time.  Patient feels better nausea vomiting improved tolerated soft bland diet.  Patient is cleared from GI to be discharged.  Patient is being  discharged home.   Discharge Diagnoses:  Principal Problem:   SIRS (systemic inflammatory response syndrome) (HCC) Active Problems:   Substance abuse (HCC)   Bipolar affective disorder (HCC)   Essential hypertension   Tachycardia   T2DM (type 2 diabetes mellitus) (HCC)   Hyperlipidemia   Chronic pain syndrome   Hypothyroidism   Hypokalemia   Lactic acidosis   Intractable vomiting   Cyclical vomiting syndrome   RLQ abdominal pain  SIRS, Lactic acidosis, Intractable Nausea and Vomiting: Cyclical vomiting syndrome: PICC line placed for IV access, continue IV fluids, pain control. CT A/P showed no acute abdominal pathology.  Lipase 23. Continues to have intractable nausea and vomiting. Low-grade fevers, chills, procalcitonin less than 0.1, still has leukocytosis, although improving.  Recent admission with Acinetobacter bacteremia, Follow up cultures Continue scheduled IV Phenergan x 4 doses, as needed Compazine, IM tigan For now placed on IV vancomycin and Rocephin empirically Abdominal US : Fatty liver. Advance diet as tolerated. Antibiotics discontinued. She advanced to soft diet.    High anion gap metabolic acidosis > Resolved. Likely due to lactic acidosis, metformin use, unclear ingestion of illicit substance UDS  Amphetamine+, Opiods+, Benzo+ Lactic acid improved, continue IV fluid hydration until tolerating diet   Substance abuse (HCC) UDS: Amphetamine+, Opoids+, Benzo+. Social worker consult.   Bipolar affective disorder ([place]) Continue Abilify, Prozac   Hypertensive urgency: Continue scheduled IV Lopressor,  Continue  IV hydralazine as needed with parameters Continue to monitor BP.   DM II: Continue to hold metformin Placed on sensitive sliding scale insulin.    Hyperlipidemia: Hold statin:   Chronic pain syndrome: Continue pain medications   Hypothyroidism: Continue Synthroid, TSH 0.4   Hypokalemia: Replaced. Continue to monitor.   Prolonged  Qtc: QTc 537 on admission DC IV Reglan, Prozac Repeat EKG in a.m  normal     Obesity Estimated body mass index is 32.11 kg/m as calculated from the following:   Height as of this encounter: 5' 4.5" (1.638 m).   Weight as of this encounter: 86.2 kg.  Discharge Instructions  Discharge Instructions     Call MD for:  difficulty breathing, headache or visual disturbances   Complete by: As directed    Call MD for:  persistant dizziness or light-headedness   Complete by: As directed    Call MD for:  persistant nausea and vomiting   Complete by: As directed    Diet - low sodium heart healthy   Complete by: As directed    Diet Carb Modified   Complete by: As directed    Discharge instructions   Complete by: As directed    Advised to follow-up with primary care physician in 1 week. Advised to follow-up with gastroenterology as scheduled. Advised to continue current medications as prescribed.  Blood cultures has been negative.   Increase activity slowly   Complete by: As directed       Allergies as of 05/24/2022   No Known Allergies      Medication List     STOP taking these medications    lactulose 10 GM/15ML solution Commonly known as: CHRONULAC   omeprazole 20 MG capsule Commonly known as: PRILOSEC       TAKE these medications    acetaminophen 500 MG tablet Commonly known as: TYLENOL Take 1,000 mg by mouth as needed for mild pain or headache.   ARIPiprazole 5 MG tablet Commonly known as: ABILIFY Take 1 tablet (5 mg total) by mouth daily.   atorvastatin 10 MG tablet Commonly known as: LIPITOR Take 10 mg by mouth every evening.   FLUoxetine 40 MG capsule Commonly known as: PROZAC Take 1 capsule (40 mg total) by mouth daily.   levothyroxine 50 MCG tablet Commonly known as: SYNTHROID Take 50 mcg by mouth daily before breakfast.   lidocaine 5 % ointment Commonly known as: XYLOCAINE Apply 1 Application topically daily as needed for mild pain.    metFORMIN 500 MG tablet Commonly known as: GLUCOPHAGE Take 500 mg by mouth 2 (two) times daily with a meal.   metoCLOPramide 5 MG tablet Commonly known as: REGLAN Take 1 tablet (5 mg total) by mouth 3 (three) times daily before meals for 5 days.   multivitamin Tabs tablet Take 1 tablet by mouth at bedtime.   ondansetron 8 MG disintegrating tablet Commonly known as: ZOFRAN-ODT Take 1 tablet (8 mg total) by mouth every 8 (eight) hours as needed for nausea or vomiting. What changed: when to take this   OVER THE COUNTER MEDICATION Apply 1 Application topically as needed (itching). OTC Anti itch cream   oxyCODONE-acetaminophen 7.5-325 MG tablet Commonly known as: PERCOCET Take 1 tablet by mouth 4 (four) times daily.   pantoprazole 40 MG tablet Commonly known as: Protonix Take 1 tablet (40 mg total) by mouth 2 (two) times daily.   prazosin 2 MG capsule Commonly known as: MINIPRESS Take 1 capsule (2 mg total) by mouth at bedtime.   promethazine 25 MG tablet Commonly known as: PHENERGAN Take 25 mg by mouth as needed for nausea or vomiting. What changed: Another medication with the same name was changed. Make sure you understand how and when to take each.   promethazine 25 MG suppository Commonly known as: PHENERGAN Place 1 suppository (25 mg total) rectally every 6 (six) hours as needed for nausea or vomiting. What  changed: when to take this   tamsulosin 0.4 MG Caps capsule Commonly known as: FLOMAX Take 1 capsule (0.4 mg total) by mouth daily after breakfast.   tiZANidine 4 MG tablet Commonly known as: ZANAFLEX Take 3 tablets (12 mg total) by mouth every 8 (eight) hours as needed for up to 12 doses for muscle spasms. What changed: when to take this   traZODone 50 MG tablet Commonly known as: DESYREL Take 3 tablets (150 mg total) by mouth at bedtime.        Follow-up Information     Ferd Hibbs, NP Follow up in 1 week(s).   Specialty: Nurse  Practitioner Contact information: [place] [address] [phone]         Irene Shipper, MD Follow up in 1 month(s).   Specialty: Gastroenterology Contact information: [address] [place] [address] [phone]                No Known Allergies  Consultations: Gastroenterology   Procedures/Studies: US Abdomen Complete  Result Date: 05/22/2022 CLINICAL DATA:  Abdominal pain, nausea, vomiting. EXAM: ABDOMEN ULTRASOUND COMPLETE COMPARISON:  CT scan May 21, 2022. FINDINGS: Gallbladder: No gallstones or wall thickening visualized. No sonographic Murphy sign noted by sonographer. Common bile duct: Diameter: 3 mm which is within normal limits. Liver: No focal lesion identified. Increased echogenicity of hepatic parenchyma is noted suggesting hepatic steatosis. Portal vein is patent on color Doppler imaging with normal direction of blood flow towards the liver. IVC: No abnormality visualized. Pancreas: Not well visualized due to overlying bowel gas. Spleen: Size and appearance within normal limits. Right Kidney: Length: 9.4 cm. Echogenicity within normal limits. No mass or hydronephrosis visualized. Left Kidney: Length: 9.1 cm. Echogenicity within normal limits. No mass or hydronephrosis visualized. Abdominal aorta: No aneurysm visualized. Other findings: None. IMPRESSION: Increased echogenicity of hepatic parenchyma is noted suggesting hepatic steatosis. No other definite abnormality seen in the abdomen. Electronically Signed   By: Marijo Conception M.D.   On: 05/22/2022 14:44   [place] EKG SITE RITE  Result Date: 05/21/2022 If Site Rite image not attached, placement could not be confirmed due to current cardiac rhythm.  CT ABDOMEN PELVIS W CONTRAST  Result Date: 05/21/2022 CLINICAL DATA:  Right lower quadrant abdominal pain, nausea/vomiting/diarrhea EXAM: CT ABDOMEN AND PELVIS WITH CONTRAST TECHNIQUE: Multidetector CT imaging of the abdomen and pelvis  was performed using the standard protocol following bolus administration of intravenous contrast. RADIATION DOSE REDUCTION: This exam was performed according to the departmental dose-optimization program which includes automated exposure control, adjustment of the mA and/or kV according to patient size and/or use of iterative reconstruction technique. CONTRAST:  128m OMNIPAQUE IOHEXOL 300 MG/ML  SOLN COMPARISON:  04/23/2022 FINDINGS: Lower chest: Lung bases are clear. Hepatobiliary: Liver is within normal limits. Gallbladder is unremarkable. No intrahepatic or extrahepatic ductal dilatation. Pancreas: Within normal limits. Spleen: Within normal limits. Adrenals/Urinary Tract: Adrenal glands are within normal limits. Kidneys are within normal limits.  No hydronephrosis. Bladder is within normal limits. Stomach/Bowel: Stomach is within normal limits. No evidence of bowel obstruction. Normal appendix (series 2/56) No colonic wall thickening or inflammatory changes. Vascular/Lymphatic: No evidence of abdominal aortic aneurysm. No suspicious abdominopelvic lymphadenopathy. Reproductive: Uterus is within normal limits. Bilateral ovaries are within normal limits. Other: No abdominopelvic ascites. Musculoskeletal: Visualized osseous structures are within normal limits. IMPRESSION: Negative CT abdomen/pelvis. Normal appendix. Electronically Signed   By: SJulian HyM.D.   On: 05/21/2022 02:53  Subjective: Patient was seen and examined at bedside.  Overnight events noted.   Patient report doing much better.  Patient wants to be discharged.  Patient being discharged home.  Discharge Exam: Vitals:   05/24/22 0530 05/24/22 1221  BP: 118/78 (!) 86/60  Pulse: 87 70  Resp: 19 20  Temp: 97.8 F (36.6 C) 97.9 F (36.6 C)  SpO2: 98%    Vitals:   05/23/22 2347 05/23/22 2351 05/24/22 0530 05/24/22 1221  BP: (!) 90/57 98/61 118/78 (!) 86/60  Pulse: 88 84 87 70  Resp:  '20 19 20  '$ Temp:   97.8 F (36.6 C)  97.9 F (36.6 C)  TempSrc:   Oral Oral  SpO2: 95% 99% 98%   Weight:      Height:        General: Pt is alert, awake, not in acute distress Cardiovascular: RRR, S1/S2 +, no rubs, no gallops Respiratory: CTA bilaterally, no wheezing, no rhonchi Abdominal: Soft, NT, ND, bowel sounds + Extremities: no edema, no cyanosis    The results of significant diagnostics from this hospitalization (including imaging, microbiology, ancillary and laboratory) are listed below for reference.     Microbiology: Recent Results (from the past 240 hour(s))  Culture, blood (Routine X 2) w Reflex to ID Panel     Status: None (Preliminary result)   Collection Time: 05/22/22 12:33 PM   Specimen: BLOOD LEFT ARM  Result Value Ref Range Status   Specimen Description   Final    BLOOD LEFT ARM Performed at [facility], [address], [place], [place] [address]    Special Requests   Final    BOTTLES DRAWN AEROBIC AND ANAEROBIC Blood Culture adequate volume Performed at [facility] [address] [place], [place] [address]    Culture   Final    NO GROWTH 2 DAYS Performed at [facility] [address] [place], [place] [address]    Report Status PENDING  Incomplete  Culture, blood (Routine X 2) w Reflex to ID Panel     Status: None (Preliminary result)   Collection Time: 05/22/22 12:40 PM   Specimen: BLOOD LEFT HAND  Result Value Ref Range Status   Specimen Description   Final    BLOOD LEFT HAND Performed at [facility], [place] [address], [place], [place] [address]    Special Requests   Final    BOTTLES DRAWN AEROBIC ONLY Blood Culture adequate volume Performed at [facility] [address], [place], [place] [address]    Culture   Final    NO GROWTH 2 DAYS Performed at [facility] [address], [place], [place] [address]    Report Status PENDING  Incomplete     Labs: BNP (last 3 results) No results for input(s): "BNP" in  the last 8760 hours. Basic Metabolic Panel: Recent Labs  Lab 05/20/22 2354 05/21/22 1810 05/22/22 0630 05/23/22 0222 05/24/22 0220  NA 144 146* 141 140 140  K 3.2* 3.1* 3.4* 4.0 4.4  CL 103 107 106 107 111  CO2 20* '24 25 23 23  '$ GLUCOSE 138* 135* 141* 130* 118*  BUN <5* <5* <5* <5* 6  CREATININE 0.94 0.74 0.68 0.67 0.70  CALCIUM 9.7 8.9 8.7* 8.4* 8.1*  MG  --  1.7  --   --  1.8  PHOS  --   --   --   --  2.4*   Liver Function Tests: Recent  Labs  Lab 05/21/22 1810 05/24/22 0220  AST 38 32  ALT 27 24  ALKPHOS 72 46  BILITOT 0.8 0.7  PROT 6.7 5.6*  ALBUMIN 3.1* 2.3*   Recent Labs  Lab 05/21/22 1810  LIPASE 23   No results for input(s): "AMMONIA" in the last 168 hours. CBC: Recent Labs  Lab 05/20/22 2354 05/21/22 1810 05/22/22 0630 05/23/22 0222 05/24/22 0220  WBC 18.1* 18.6* 14.9* 12.5* 7.7  NEUTROABS 14.8*  --   --   --   --   HGB 17.0* 13.2 12.5 11.4* 10.5*  HCT 52.1* 39.7 37.8 35.5* 33.4*  MCV 92.2 92.3 92.6 94.2 96.8  PLT 491* 389 303 249 207   Cardiac Enzymes: No results for input(s): "CKTOTAL", "CKMB", "CKMBINDEX", "TROPONINI" in the last 168 hours. BNP: Invalid input(s): "POCBNP" CBG: Recent Labs  Lab 05/23/22 1153 05/23/22 1655 05/23/22 2029 05/24/22 0740 05/24/22 1223  GLUCAP 116* 138* 112* 143* 115*   D-Dimer No results for input(s): "DDIMER" in the last 72 hours. Hgb A1c Recent Labs    05/22/22 0630  HGBA1C 5.3   Lipid Profile No results for input(s): "CHOL", "HDL", "LDLCALC", "TRIG", "CHOLHDL", "LDLDIRECT" in the last 72 hours. Thyroid function studies Recent Labs    05/21/22 1810  TSH 0.414   Anemia work up No results for input(s): "VITAMINB12", "FOLATE", "FERRITIN", "TIBC", "IRON", "RETICCTPCT" in the last 72 hours. Urinalysis    Component Value Date/Time   COLORURINE YELLOW 05/22/2022 1319   APPEARANCEUR CLEAR 05/22/2022 1319   LABSPEC 1.024 05/22/2022 1319   PHURINE 5.0 05/22/2022 1319   GLUCOSEU NEGATIVE  05/22/2022 1319   HGBUR NEGATIVE 05/22/2022 1319   BILIRUBINUR NEGATIVE 05/22/2022 1319   KETONESUR 5 (A) 05/22/2022 1319   PROTEINUR NEGATIVE 05/22/2022 1319   UROBILINOGEN 0.2 01/19/2015 0302   NITRITE NEGATIVE 05/22/2022 1319   LEUKOCYTESUR NEGATIVE 05/22/2022 1319   Sepsis Labs Recent Labs  Lab 05/21/22 1810 05/22/22 0630 05/23/22 0222 05/24/22 0220  WBC 18.6* 14.9* 12.5* 7.7   Microbiology Recent Results (from the past 240 hour(s))  Culture, blood (Routine X 2) w Reflex to ID Panel     Status: None (Preliminary result)   Collection Time: 05/22/22 12:33 PM   Specimen: BLOOD LEFT ARM  Result Value Ref Range Status   Specimen Description   Final    BLOOD LEFT ARM Performed at [facility], [place] [address] [place], [place] [address]    Special Requests   Final    BOTTLES DRAWN AEROBIC AND ANAEROBIC Blood Culture adequate volume Performed at [facility] [address] [place], [place] [address]    Culture   Final    NO GROWTH 2 DAYS Performed at [facility] [address] [place], [place] [address]    Report Status PENDING  Incomplete  Culture, blood (Routine X 2) w Reflex to ID Panel     Status: None (Preliminary result)   Collection Time: 05/22/22 12:40 PM   Specimen: BLOOD LEFT HAND  Result Value Ref Range Status   Specimen Description   Final    BLOOD LEFT HAND Performed at [facility], [place] [address], [place], [place] [address]    Special Requests   Final    BOTTLES DRAWN AEROBIC ONLY Blood Culture adequate volume Performed at [facility] [address] [place], [place] [address]    Culture   Final    NO GROWTH 2 DAYS Performed at [facility],  [address] [place], [place] [address]    Report Status PENDING  Incomplete     Time coordinating discharge: Over 30 minutes  SIGNED:   Shawna Clamp, MD  [facility] 05/24/2022, 2:25 PM Pager   If 7PM-7AM, please contact  night-coverage

## 2022-05-24 NOTE — Discharge Instructions (Signed)
Advised to follow-up with primary care physician in 1 week. Advised to follow-up with gastroenterology as scheduled. Advised to continue current medications as prescribed.  Blood cultures has been negative.

## 2022-05-24 NOTE — TOC Transition Note (Signed)
Transition of Care [facility]) - CM/SW Discharge Note   Patient Details  Name: [name] MRN: [id number] Date of Birth: 05-05-1970  [facility] CM/SW Contact:  Leeroy Cha, RN Phone Number: 05/24/2022, 11:00 AM   Clinical Narrative:    Patient discharged to go home with self care.   Final next level of care: Home/Self Care Barriers to Discharge: Barriers Resolved   Patient Goals and CMS Choice CMS Medicare.gov Compare Post Acute Care list provided to:: Patient    Discharge Placement                         Discharge Plan and Services Additional resources added to the After Visit Summary for     Discharge Planning Services: CM Consult                                 Social Determinants of Health (SDOH) Interventions SDOH Screenings   Food Insecurity: No Food Insecurity (05/21/2022)  Housing: Low Risk  (05/21/2022)  Transportation Needs: No Transportation Needs (05/21/2022)  Utilities: Not At Risk (05/21/2022)  Depression (PHQ2-9): Low Risk  (08/16/2020)  Tobacco Use: Low Risk  (05/23/2022)     Readmission Risk Interventions   Row Labels 05/22/2022    9:25 AM 04/27/2022    9:05 AM 03/24/2022   11:01 AM  Readmission Risk Prevention Plan   Section Header. No data exists in this row.     Transportation Screening   Complete Complete Complete  Medication Review Press photographer)   Complete Complete Complete  PCP or Specialist appointment within 3-5 days of discharge   Complete Complete Complete  HRI or [place]   Complete Complete Complete  SW Recovery Care/Counseling Consult   Complete Complete Complete  Palliative Care Screening   Not Applicable Not Applicable Not [place]   Not Applicable Not Applicable Not Applicable

## 2022-05-27 LAB — CULTURE, BLOOD (ROUTINE X 2)
Culture: NO GROWTH
Culture: NO GROWTH
Special Requests: ADEQUATE
Special Requests: ADEQUATE

## 2022-06-06 ENCOUNTER — Ambulatory Visit: Payer: Medicare Other | Admitting: Gastroenterology

## 2022-06-25 ENCOUNTER — Other Ambulatory Visit: Payer: Self-pay

## 2022-06-25 ENCOUNTER — Inpatient Hospital Stay (HOSPITAL_COMMUNITY)
Admission: EM | Admit: 2022-06-25 | Discharge: 2022-07-01 | DRG: 872 | Disposition: A | Discharge status: Home Health | Payer: Medicare Other | Attending: Internal Medicine | Admitting: Internal Medicine

## 2022-06-25 ENCOUNTER — Encounter (HOSPITAL_COMMUNITY): Payer: Self-pay

## 2022-06-25 ENCOUNTER — Emergency Department (HOSPITAL_COMMUNITY): Payer: Medicare Other

## 2022-06-25 DIAGNOSIS — F32A Depression, unspecified: Secondary | ICD-10-CM | POA: Diagnosis present

## 2022-06-25 DIAGNOSIS — Z79899 Other long term (current) drug therapy: Secondary | ICD-10-CM

## 2022-06-25 DIAGNOSIS — Z79891 Long term (current) use of opiate analgesic: Secondary | ICD-10-CM

## 2022-06-25 DIAGNOSIS — R1115 Cyclical vomiting syndrome unrelated to migraine: Secondary | ICD-10-CM | POA: Diagnosis present

## 2022-06-25 DIAGNOSIS — E1169 Type 2 diabetes mellitus with other specified complication: Secondary | ICD-10-CM | POA: Diagnosis present

## 2022-06-25 DIAGNOSIS — E876 Hypokalemia: Secondary | ICD-10-CM | POA: Diagnosis present

## 2022-06-25 DIAGNOSIS — R112 Nausea with vomiting, unspecified: Secondary | ICD-10-CM | POA: Diagnosis not present

## 2022-06-25 DIAGNOSIS — B961 Klebsiella pneumoniae [K. pneumoniae] as the cause of diseases classified elsewhere: Secondary | ICD-10-CM | POA: Diagnosis present

## 2022-06-25 DIAGNOSIS — R059 Cough, unspecified: Secondary | ICD-10-CM | POA: Diagnosis present

## 2022-06-25 DIAGNOSIS — E869 Volume depletion, unspecified: Secondary | ICD-10-CM | POA: Diagnosis present

## 2022-06-25 DIAGNOSIS — Z85828 Personal history of other malignant neoplasm of skin: Secondary | ICD-10-CM

## 2022-06-25 DIAGNOSIS — A4159 Other Gram-negative sepsis: Principal | ICD-10-CM | POA: Diagnosis present

## 2022-06-25 DIAGNOSIS — I10 Essential (primary) hypertension: Secondary | ICD-10-CM | POA: Diagnosis present

## 2022-06-25 DIAGNOSIS — Z8674 Personal history of sudden cardiac arrest: Secondary | ICD-10-CM

## 2022-06-25 DIAGNOSIS — Z7984 Long term (current) use of oral hypoglycemic drugs: Secondary | ICD-10-CM

## 2022-06-25 DIAGNOSIS — K219 Gastro-esophageal reflux disease without esophagitis: Secondary | ICD-10-CM | POA: Diagnosis present

## 2022-06-25 DIAGNOSIS — E782 Mixed hyperlipidemia: Secondary | ICD-10-CM | POA: Diagnosis present

## 2022-06-25 DIAGNOSIS — Y9301 Activity, walking, marching and hiking: Secondary | ICD-10-CM | POA: Diagnosis present

## 2022-06-25 DIAGNOSIS — I252 Old myocardial infarction: Secondary | ICD-10-CM

## 2022-06-25 DIAGNOSIS — R000 Tachycardia, unspecified: Secondary | ICD-10-CM | POA: Diagnosis present

## 2022-06-25 DIAGNOSIS — E669 Obesity, unspecified: Secondary | ICD-10-CM | POA: Diagnosis present

## 2022-06-25 DIAGNOSIS — R4182 Altered mental status, unspecified: Secondary | ICD-10-CM | POA: Diagnosis not present

## 2022-06-25 DIAGNOSIS — F418 Other specified anxiety disorders: Secondary | ICD-10-CM | POA: Diagnosis present

## 2022-06-25 DIAGNOSIS — E039 Hypothyroidism, unspecified: Secondary | ICD-10-CM | POA: Diagnosis present

## 2022-06-25 DIAGNOSIS — R296 Repeated falls: Secondary | ICD-10-CM | POA: Diagnosis present

## 2022-06-25 DIAGNOSIS — Y92019 Unspecified place in single-family (private) house as the place of occurrence of the external cause: Secondary | ICD-10-CM | POA: Diagnosis not present

## 2022-06-25 DIAGNOSIS — E872 Acidosis, unspecified: Secondary | ICD-10-CM | POA: Diagnosis present

## 2022-06-25 DIAGNOSIS — W19XXXA Unspecified fall, initial encounter: Secondary | ICD-10-CM

## 2022-06-25 DIAGNOSIS — W010XXA Fall on same level from slipping, tripping and stumbling without subsequent striking against object, initial encounter: Secondary | ICD-10-CM | POA: Diagnosis present

## 2022-06-25 DIAGNOSIS — G894 Chronic pain syndrome: Secondary | ICD-10-CM | POA: Diagnosis present

## 2022-06-25 DIAGNOSIS — A419 Sepsis, unspecified organism: Principal | ICD-10-CM | POA: Diagnosis present

## 2022-06-25 DIAGNOSIS — Z1152 Encounter for screening for COVID-19: Secondary | ICD-10-CM

## 2022-06-25 DIAGNOSIS — I4711 Inappropriate sinus tachycardia, so stated: Secondary | ICD-10-CM | POA: Diagnosis present

## 2022-06-25 DIAGNOSIS — N390 Urinary tract infection, site not specified: Secondary | ICD-10-CM | POA: Diagnosis not present

## 2022-06-25 DIAGNOSIS — I959 Hypotension, unspecified: Secondary | ICD-10-CM | POA: Diagnosis present

## 2022-06-25 DIAGNOSIS — E119 Type 2 diabetes mellitus without complications: Secondary | ICD-10-CM | POA: Diagnosis not present

## 2022-06-25 DIAGNOSIS — I509 Heart failure, unspecified: Secondary | ICD-10-CM | POA: Diagnosis not present

## 2022-06-25 DIAGNOSIS — S32040A Wedge compression fracture of fourth lumbar vertebra, initial encounter for closed fracture: Secondary | ICD-10-CM | POA: Diagnosis present

## 2022-06-25 DIAGNOSIS — N3000 Acute cystitis without hematuria: Secondary | ICD-10-CM

## 2022-06-25 DIAGNOSIS — Y92009 Unspecified place in unspecified non-institutional (private) residence as the place of occurrence of the external cause: Secondary | ICD-10-CM

## 2022-06-25 DIAGNOSIS — Z6831 Body mass index (BMI) 31.0-31.9, adult: Secondary | ICD-10-CM

## 2022-06-25 DIAGNOSIS — M5030 Other cervical disc degeneration, unspecified cervical region: Secondary | ICD-10-CM | POA: Diagnosis present

## 2022-06-25 LAB — CBC WITH DIFFERENTIAL/PLATELET
Abs Immature Granulocytes: 0.05 10*3/uL (ref 0.00–0.07)
Basophils Absolute: 0.1 10*3/uL (ref 0.0–0.1)
Basophils Relative: 1 %
Eosinophils Absolute: 0.4 10*3/uL (ref 0.0–0.5)
Eosinophils Relative: 4 %
HCT: 45.7 % (ref 36.0–46.0)
Hemoglobin: 15.1 g/dL — ABNORMAL HIGH (ref 12.0–15.0)
Immature Granulocytes: 0 %
Lymphocytes Relative: 25 %
Lymphs Abs: 2.8 10*3/uL (ref 0.7–4.0)
MCH: 30.4 pg (ref 26.0–34.0)
MCHC: 33 g/dL (ref 30.0–36.0)
MCV: 92 fL (ref 80.0–100.0)
Monocytes Absolute: 0.6 10*3/uL (ref 0.1–1.0)
Monocytes Relative: 5 %
Neutro Abs: 7.3 10*3/uL (ref 1.7–7.7)
Neutrophils Relative %: 65 %
Platelets: 377 10*3/uL (ref 150–400)
RBC: 4.97 MIL/uL (ref 3.87–5.11)
RDW: 13.3 % (ref 11.5–15.5)
WBC: 11.1 10*3/uL — ABNORMAL HIGH (ref 4.0–10.5)
nRBC: 0 % (ref 0.0–0.2)

## 2022-06-25 LAB — MAGNESIUM: Magnesium: 1.3 mg/dL — ABNORMAL LOW (ref 1.7–2.4)

## 2022-06-25 LAB — APTT: aPTT: 27 seconds (ref 24–36)

## 2022-06-25 LAB — I-STAT CHEM 8, ED
BUN: 6 mg/dL (ref 6–20)
Calcium, Ion: 1.05 mmol/L — ABNORMAL LOW (ref 1.15–1.40)
Chloride: 105 mmol/L (ref 98–111)
Creatinine, Ser: 1 mg/dL (ref 0.44–1.00)
Glucose, Bld: 135 mg/dL — ABNORMAL HIGH (ref 70–99)
HCT: 46 % (ref 36.0–46.0)
Hemoglobin: 15.6 g/dL — ABNORMAL HIGH (ref 12.0–15.0)
Potassium: 3.2 mmol/L — ABNORMAL LOW (ref 3.5–5.1)
Sodium: 140 mmol/L (ref 135–145)
TCO2: 20 mmol/L — ABNORMAL LOW (ref 22–32)

## 2022-06-25 LAB — LACTIC ACID, PLASMA
Lactic Acid, Venous: 5.9 mmol/L (ref 0.5–1.9)
Lactic Acid, Venous: 3.5 mmol/L (ref 0.5–1.9)
Lactic Acid, Venous: 3.8 mmol/L (ref 0.5–1.9)
Lactic Acid, Venous: 2.9 mmol/L (ref 0.5–1.9)

## 2022-06-25 LAB — COMPREHENSIVE METABOLIC PANEL
ALT: 31 U/L (ref 0–44)
AST: 49 U/L — ABNORMAL HIGH (ref 15–41)
Albumin: 3.1 g/dL — ABNORMAL LOW (ref 3.5–5.0)
Alkaline Phosphatase: 119 U/L (ref 38–126)
Anion gap: 16 — ABNORMAL HIGH (ref 5–15)
BUN: 9 mg/dL (ref 6–20)
CO2: 18 mmol/L — ABNORMAL LOW (ref 22–32)
Calcium: 8.8 mg/dL — ABNORMAL LOW (ref 8.9–10.3)
Chloride: 104 mmol/L (ref 98–111)
Creatinine, Ser: 1.09 mg/dL — ABNORMAL HIGH (ref 0.44–1.00)
GFR, Estimated: >60 mL/min (ref >60)
Glucose, Bld: 137 mg/dL — ABNORMAL HIGH (ref 70–99)
Potassium: 3 mmol/L — ABNORMAL LOW (ref 3.5–5.1)
Sodium: 138 mmol/L (ref 135–145)
Total Bilirubin: 0.7 mg/dL (ref 0.3–1.2)
Total Protein: 7.5 g/dL (ref 6.5–8.1)

## 2022-06-25 LAB — URINALYSIS, W/ REFLEX TO CULTURE (INFECTION SUSPECTED)
Bilirubin Urine: NEGATIVE
Glucose, UA: NEGATIVE mg/dL
Hgb urine dipstick: NEGATIVE
Ketones, ur: NEGATIVE mg/dL
Nitrite: POSITIVE — AB
Protein, ur: NEGATIVE mg/dL
Specific Gravity, Urine: 1.035 — ABNORMAL HIGH (ref 1.005–1.030)
pH: 5 (ref 5.0–8.0)

## 2022-06-25 LAB — CBG MONITORING, ED: Glucose-Capillary: 139 mg/dL — ABNORMAL HIGH (ref 70–99)

## 2022-06-25 LAB — RESP PANEL BY RT-PCR (RSV, FLU A&B, COVID)  RVPGX2
Influenza A by PCR: NEGATIVE
Influenza B by PCR: NEGATIVE
Resp Syncytial Virus by PCR: NEGATIVE
SARS Coronavirus 2 by RT PCR: NEGATIVE

## 2022-06-25 LAB — PROTIME-INR
INR: 1.1 (ref 0.8–1.2)
Prothrombin Time: 14.1 seconds (ref 11.4–15.2)

## 2022-06-25 LAB — GLUCOSE, CAPILLARY
Glucose-Capillary: 144 mg/dL — ABNORMAL HIGH (ref 70–99)
Glucose-Capillary: 160 mg/dL — ABNORMAL HIGH (ref 70–99)

## 2022-06-25 MED ORDER — ATORVASTATIN CALCIUM 10 MG PO TABS
10.0000 mg | ORAL_TABLET | Freq: Every evening | ORAL | Status: DC
  Administered 2022-06-25 – 2022-06-30 (×6): 10 mg via ORAL
  Filled 2022-06-25 (×6): qty 1

## 2022-06-25 MED ORDER — VANCOMYCIN HCL IN DEXTROSE 1-5 GM/200ML-% IV SOLN: 1000.0000 mg | Freq: Once | INTRAVENOUS | Status: DC

## 2022-06-25 MED ORDER — ONDANSETRON HCL 4 MG PO TABS
4.0000 mg | ORAL_TABLET | Freq: Four times a day (QID) | ORAL | Status: DC | PRN
  Administered 2022-06-25: 4 mg via ORAL
  Filled 2022-06-25 (×2): qty 1

## 2022-06-25 MED ORDER — ENOXAPARIN SODIUM 40 MG/0.4ML IJ SOSY
40.0000 mg | PREFILLED_SYRINGE | INTRAMUSCULAR | Status: DC
  Administered 2022-06-25 – 2022-06-27 (×3): 40 mg via SUBCUTANEOUS
  Filled 2022-06-25 (×3): qty 0.4

## 2022-06-25 MED ORDER — FENTANYL CITRATE PF 50 MCG/ML IJ SOSY
12.5000 ug | PREFILLED_SYRINGE | Freq: Once | INTRAMUSCULAR | Status: AC | PRN
  Administered 2022-06-25: 12.5 ug via INTRAVENOUS
  Filled 2022-06-25: qty 1

## 2022-06-25 MED ORDER — SODIUM CHLORIDE (PF) 0.9 % IJ SOLN
INTRAMUSCULAR | Status: AC
  Filled 2022-06-25: qty 50

## 2022-06-25 MED ORDER — FLUOXETINE HCL 20 MG PO CAPS
40.0000 mg | ORAL_CAPSULE | Freq: Every day | ORAL | Status: DC
  Administered 2022-06-25 – 2022-07-01 (×7): 40 mg via ORAL
  Filled 2022-06-25 (×8): qty 2

## 2022-06-25 MED ORDER — VANCOMYCIN HCL 1750 MG/350ML IV SOLN
1750.0000 mg | Freq: Once | INTRAVENOUS | Status: AC
  Administered 2022-06-25: 1750 mg via INTRAVENOUS
  Filled 2022-06-25: qty 350

## 2022-06-25 MED ORDER — ONDANSETRON HCL 4 MG/2ML IJ SOLN
4.0000 mg | Freq: Once | INTRAMUSCULAR | Status: AC
  Administered 2022-06-25: 4 mg via INTRAVENOUS
  Filled 2022-06-25: qty 2

## 2022-06-25 MED ORDER — LACTATED RINGERS IV SOLN
150.0000 mL/h | INTRAVENOUS | Status: DC
  Administered 2022-06-25 – 2022-06-26 (×3): 150 mL/h via INTRAVENOUS

## 2022-06-25 MED ORDER — LIDOCAINE 5 % EX OINT
1.0000 | TOPICAL_OINTMENT | Freq: Every day | CUTANEOUS | Status: DC | PRN
  Filled 2022-06-25: qty 35.44

## 2022-06-25 MED ORDER — ARIPIPRAZOLE 5 MG PO TABS
5.0000 mg | ORAL_TABLET | Freq: Every day | ORAL | Status: DC
  Administered 2022-06-26 – 2022-07-01 (×6): 5 mg via ORAL
  Filled 2022-06-25 (×7): qty 1

## 2022-06-25 MED ORDER — SODIUM CHLORIDE 0.9 % IV SOLN
2.0000 g | Freq: Once | INTRAVENOUS | Status: AC
  Administered 2022-06-25: 2 g via INTRAVENOUS
  Filled 2022-06-25: qty 12.5

## 2022-06-25 MED ORDER — OXYCODONE-ACETAMINOPHEN 7.5-325 MG PO TABS
1.0000 | ORAL_TABLET | Freq: Four times a day (QID) | ORAL | Status: DC
  Administered 2022-06-25 – 2022-07-01 (×23): 1 via ORAL
  Filled 2022-06-25 (×24): qty 1

## 2022-06-25 MED ORDER — METRONIDAZOLE 500 MG/100ML IV SOLN
500.0000 mg | Freq: Once | INTRAVENOUS | Status: AC
  Administered 2022-06-25: 500 mg via INTRAVENOUS
  Filled 2022-06-25: qty 100

## 2022-06-25 MED ORDER — LACTATED RINGERS IV BOLUS
1000.0000 mL | Freq: Once | INTRAVENOUS | Status: AC
  Administered 2022-06-25: 1000 mL via INTRAVENOUS

## 2022-06-25 MED ORDER — RENA-VITE PO TABS
1.0000 | ORAL_TABLET | Freq: Every day | ORAL | Status: DC
  Administered 2022-06-25 – 2022-06-30 (×6): 1 via ORAL
  Filled 2022-06-25 (×8): qty 1

## 2022-06-25 MED ORDER — LACTATED RINGERS IV BOLUS (SEPSIS)
1000.0000 mL | Freq: Once | INTRAVENOUS | Status: AC
  Administered 2022-06-25: 1000 mL via INTRAVENOUS

## 2022-06-25 MED ORDER — TRAZODONE HCL 50 MG PO TABS
150.0000 mg | ORAL_TABLET | Freq: Every day | ORAL | Status: DC
  Administered 2022-06-25 – 2022-06-30 (×6): 150 mg via ORAL
  Filled 2022-06-25 (×9): qty 1

## 2022-06-25 MED ORDER — MAGNESIUM SULFATE 2 GM/50ML IV SOLN
2.0000 g | Freq: Once | INTRAVENOUS | Status: AC
  Administered 2022-06-25: 2 g via INTRAVENOUS
  Filled 2022-06-25: qty 50

## 2022-06-25 MED ORDER — POTASSIUM CHLORIDE CRYS ER 20 MEQ PO TBCR
40.0000 meq | EXTENDED_RELEASE_TABLET | Freq: Once | ORAL | Status: AC
  Administered 2022-06-25: 40 meq via ORAL
  Filled 2022-06-25: qty 2

## 2022-06-25 MED ORDER — SODIUM CHLORIDE 0.9 % IV SOLN
1.0000 g | INTRAVENOUS | Status: DC
  Administered 2022-06-25: 1 g via INTRAVENOUS
  Filled 2022-06-25 (×2): qty 10

## 2022-06-25 MED ORDER — ONDANSETRON HCL 4 MG/2ML IJ SOLN
4.0000 mg | Freq: Four times a day (QID) | INTRAMUSCULAR | Status: DC | PRN
  Administered 2022-06-26 – 2022-06-29 (×10): 4 mg via INTRAVENOUS
  Filled 2022-06-25 (×10): qty 2

## 2022-06-25 MED ORDER — PANTOPRAZOLE SODIUM 40 MG PO TBEC
40.0000 mg | DELAYED_RELEASE_TABLET | Freq: Two times a day (BID) | ORAL | Status: DC
  Administered 2022-06-25 – 2022-07-01 (×12): 40 mg via ORAL
  Filled 2022-06-25 (×13): qty 1

## 2022-06-25 MED ORDER — LEVOTHYROXINE SODIUM 50 MCG PO TABS
50.0000 ug | ORAL_TABLET | Freq: Every day | ORAL | Status: DC
  Administered 2022-06-26 – 2022-07-01 (×6): 50 ug via ORAL
  Filled 2022-06-25 (×6): qty 1

## 2022-06-25 MED ORDER — FENTANYL CITRATE PF 50 MCG/ML IJ SOSY
25.0000 ug | PREFILLED_SYRINGE | Freq: Once | INTRAMUSCULAR | Status: AC
  Administered 2022-06-25: 25 ug via INTRAVENOUS
  Filled 2022-06-25: qty 1

## 2022-06-25 MED ORDER — ACETAMINOPHEN 500 MG PO TABS
1000.0000 mg | ORAL_TABLET | Freq: Four times a day (QID) | ORAL | Status: DC | PRN
  Administered 2022-06-25 – 2022-06-26 (×3): 1000 mg via ORAL
  Filled 2022-06-25 (×3): qty 2

## 2022-06-25 MED ORDER — POTASSIUM CHLORIDE 10 MEQ/100ML IV SOLN
10.0000 meq | INTRAVENOUS | Status: AC
  Administered 2022-06-25 (×3): 10 meq via INTRAVENOUS
  Filled 2022-06-25 (×3): qty 100

## 2022-06-25 MED ORDER — IOHEXOL 300 MG/ML  SOLN
100.0000 mL | Freq: Once | INTRAMUSCULAR | Status: AC | PRN
  Administered 2022-06-25: 100 mL via INTRAVENOUS

## 2022-06-25 MED ORDER — LACTATED RINGERS IV SOLN: INTRAVENOUS | Status: DC

## 2022-06-25 MED ORDER — TIZANIDINE HCL 4 MG PO TABS
4.0000 mg | ORAL_TABLET | Freq: Three times a day (TID) | ORAL | Status: DC | PRN
  Administered 2022-06-25 – 2022-06-30 (×5): 4 mg via ORAL
  Filled 2022-06-25 (×5): qty 1

## 2022-06-25 NOTE — ED Notes (Signed)
Critical   lactic acid   5.9    report given to PA [facility] and Dr Tomi Bamberger

## 2022-06-25 NOTE — Progress Notes (Signed)
Orthopedic Tech Progress Note Patient Details:  [name] [birth year] [id number]  Ortho Devices Type of Ortho Device: Lumbar corsett Ortho Device/Splint Interventions: Ordered, Adjustment   Post Interventions Instructions Provided: Care of device, Adjustment of device LSO provided per comments in order. [name] 06/25/2022, 3:25 PM

## 2022-06-25 NOTE — Progress Notes (Signed)
A consult was received from an ED physician for vanc/cefepime per pharmacy dosing.  The patient's profile has been reviewed for ht/wt/allergies/indication/available labs.   A one time order has been placed for vanc '1750mg'$ , cefepime 2g.  Further antibiotics/pharmacy consults should be ordered by admitting physician if indicated.                       Thank you, Kara Mead 06/25/2022  1:32 PM

## 2022-06-25 NOTE — Assessment & Plan Note (Addendum)
Imaging on admission shows new mild superior endplate compression deformity at L4 Continued complaints of severe low back pain although this is somewhat improved compared to yesterday.   IR evaluated patient for possible kyphoplasty on 3/6 but is currently not proceeding until after active infection is treated Placing Lidoderm patches which seemingly has improved the patient's pain. Continue as needed intravenous Toradol Continue as needed opiate-based analgesics PT/OT evaluation -recommending home health physical therapy Orthopedic surgery consult has been requested and they recommended an LSO brace

## 2022-06-25 NOTE — ED Notes (Signed)
ED TO INPATIENT HANDOFF REPORT  ED Nurse Name and Phone #: Hebert Soho, [place]  S Name/Age/Gender [name] 52 y.o. female Room/Bed: WA11/WA11  Code Status   Code Status: Prior  Home/SNF/Other Home Patient oriented to: self, place, time, and situation Is this baseline? Yes   Triage Complete: Triage complete  Chief Complaint Compression fracture of fourth lumbar vertebra [facility]) [id number]  Triage Note Patient said she has had 2 falls, yesterday and Saturday. She said she lost her balance. No LOC. No blood thinners. Hit her head and her back and right shoulder. Has increased nausea and vomiting related to pain. Complaining of back pain and lower abdominal pain.  Took 1 percocet 7.'5mg'$  before arrival.    Allergies No Known Allergies  Level of Care/Admitting Diagnosis ED Disposition     ED Disposition  Admit   Condition  --   Comment  Hospital Area: [place] [[id number]]  Level of Care: Med-Surg [16]  May admit patient to [facility] or [facility] if equivalent level of care is available:: Yes  Covid Evaluation: Asymptomatic - no recent exposure (last 10 days) testing not required  Diagnosis: Compression [facility] of fourth lumbar vertebra [id number]  Admitting Physician: Gary Fleet  Attending Physician: AGBATA, TOCHUKWU [id number]  Certification:: I certify this patient will need inpatient services for at least 2 midnights  Estimated Length of Stay: 3          B Medical/Surgery History Past Medical History:  Diagnosis Date   Anxiety    Arthritis    "joints ache all over" (10/15/2014)   Barrett's esophagus    Bulging lumbar disc    Chronic lower back pain    DDD (degenerative disc disease), cervical    Depression    DM (diabetes mellitus) ([place])    Drug-seeking behavior    Headache    "weekly" (10/15/2014)   Hyperlipemia    Hypertension    PTSD (post-traumatic stress disorder)    Skin cancer    "had them cut off my  arms; don't know what kind"   Past Surgical History:  Procedure Laterality Date   ABLATION ON ENDOMETRIOSIS  2008   BIOPSY  12/27/2018   Procedure: BIOPSY;  Surgeon: Thornton Park, MD;  Location: [facility] ENDOSCOPY;  Service: Gastroenterology;;   BIOPSY  10/05/2021   Procedure: BIOPSY;  Surgeon: Gatha Mayer, MD;  Location: [facility] END[facility]COPY;  Service: Gastroenterology;;   ESOPHAGOGASTRODUODENOSCOPY (EGD) WITH PROPOFOL N/A 12/27/2018   Procedure: ESOPHAGOGASTRODUODENOSCOPY (EGD) WITH PROPOFOL;  Surgeon: Thornton Park, MD;  Location: [facility] ENDOSCOPY;  Service: Gastroenterology;  Laterality: N/A;   ESOPHAGOGASTRODUODENOSCOPY (EGD) WITH PROPOFOL N/A 10/05/2021   Procedure: ESOPHAGOGASTRODUODENOSCOPY (EGD) WITH PROPOFOL;  Surgeon: Gatha Mayer, MD;  Location: [place];  Service: Gastroenterology;  Laterality: N/A;   HEMORRHOID SURGERY  ~ 2002   IR FLUORO GUIDE CV LINE RIGHT  06/14/2020   IR REMOVAL TUN CV CATH W/O FL  06/23/2020   IR US GUIDE VASC ACCESS RIGHT  06/14/2020   ORIF ANKLE FRACTURE Right 03/28/2020   Procedure: OPEN REDUCTION INTERNAL FIXATION (ORIF) RIGHT BIMALLEOLAR ANKLE FRACTURE;  Surgeon: Marchia Bond, MD;  Location: [place];  Service: Orthopedics;  Laterality: Right;     A IV Location/Drains/Wounds Patient Lines/Drains/Airways Status     Active Line/Drains/Airways     Name Placement date Placement time Site Days   Peripheral IV 06/25/22 20 G 2.5" Left;Upper;Lateral Arm 06/25/22  1036  Arm  less than 1   External  Urinary Catheter 04/24/22  1900  --  62            Intake/Output Last 24 hours  Intake/Output Summary (Last 24 hours) at 06/25/2022 1409 Last data filed at 06/25/2022 1330 Gross per 24 hour  Intake 2143.91 ml  Output --  Net 2143.91 ml    Labs/Imaging Results for orders placed or performed during the hospital encounter of 06/25/22 (from the past 48 hour(s))  CBG monitoring, ED     Status: Abnormal   Collection Time: 06/25/22  9:47 AM   Result Value Ref Range   Glucose-Capillary 139 (H) 70 - 99 mg/dL    Comment: Glucose reference range applies only to samples taken after fasting for at least 8 hours.  CBC with Differential     Status: Abnormal   Collection Time: 06/25/22 10:38 AM  Result Value Ref Range   WBC 11.1 (H) 4.0 - 10.5 K/uL   RBC 4.97 3.87 - 5.11 MIL/uL   Hemoglobin 15.1 (H) 12.0 - 15.0 g/dL   HCT 45.7 36.0 - 46.0 %   MCV 92.0 80.0 - 100.0 fL   MCH 30.4 26.0 - 34.0 pg   MCHC 33.0 30.0 - 36.0 g/dL   RDW 13.3 11.5 - 15.5 %   Platelets 377 150 - 400 K/uL   nRBC 0.0 0.0 - 0.2 %   Neutrophils Relative % 65 %   Neutro Abs 7.3 1.7 - 7.7 K/uL   Lymphocytes Relative 25 %   Lymphs Abs 2.8 0.7 - 4.0 K/uL   Monocytes Relative 5 %   Monocytes Absolute 0.6 0.1 - 1.0 K/uL   Eosinophils Relative 4 %   Eosinophils Absolute 0.4 0.0 - 0.5 K/uL   Basophils Relative 1 %   Basophils Absolute 0.1 0.0 - 0.1 K/uL   Immature Granulocytes 0 %   Abs Immature Granulocytes 0.05 0.00 - 0.07 K/uL    Comment: Performed at [facility], [place] [address] [place], [place] [address]  Comprehensive metabolic panel     Status: Abnormal   Collection Time: 06/25/22 10:38 AM  Result Value Ref Range   Sodium 138 135 - 145 mmol/L   Potassium 3.0 (L) 3.5 - 5.1 mmol/L   Chloride 104 98 - 111 mmol/L   CO2 18 (L) 22 - 32 mmol/L   Glucose, Bld 137 (H) 70 - 99 mg/dL    Comment: Glucose reference range applies only to samples taken after fasting for at least 8 hours.   BUN 9 6 - 20 mg/dL   Creatinine, Ser 1.09 (H) 0.44 - 1.00 mg/dL   Calcium 8.8 (L) 8.9 - 10.3 mg/dL   Total Protein 7.5 6.5 - 8.1 g/dL   Albumin 3.1 (L) 3.5 - 5.0 g/dL   AST 49 (H) 15 - 41 U/L   ALT 31 0 - 44 U/L   Alkaline Phosphatase 119 38 - 126 U/L   Total Bilirubin 0.7 0.3 - 1.2 mg/dL   GFR, Estimated >60 >60 mL/min    Comment: (NOTE) Calculated using the CKD-EPI Creatinine Equation (2021)    Anion gap 16 (H) 5 - 15    Comment: Performed at [facility], [place] [address], [place], [place] [address]  Magnesium     Status: Abnormal   Collection Time: 06/25/22 10:38 AM  Result Value Ref Range   Magnesium 1.3 (L) 1.7 - 2.4 mg/dL    Comment: Performed at [facility], [place] [address] [place], [place] [address]  Lactic  acid, plasma     Status: Abnormal   Collection Time: 06/25/22 10:38 AM  Result Value Ref Range   Lactic Acid, Venous 5.9 (HH) 0.5 - 1.9 mmol/L    Comment: CRITICAL RESULT CALLED TO, READ BACK BY AND VERIFIED WITH HOPKINS, D. RN AT 1135 ON 06/25/2022 BY MECIAL J. Performed at [facility], [place] [address] [place], [place] [address]   I-stat chem 8, ED (not at [facility], [facility] or [facility])     Status: Abnormal   Collection Time: 06/25/22 11:04 AM  Result Value Ref Range   Sodium 140 135 - 145 mmol/L   Potassium 3.2 (L) 3.5 - 5.1 mmol/L   Chloride 105 98 - 111 mmol/L   BUN 6 6 - 20 mg/dL   Creatinine, Ser 1.00 0.44 - 1.00 mg/dL   Glucose, Bld 135 (H) 70 - 99 mg/dL    Comment: Glucose reference range applies only to samples taken after fasting for at least 8 hours.   Calcium, Ion 1.05 (L) 1.15 - 1.40 mmol/L   TCO2 20 (L) 22 - 32 mmol/L   Hemoglobin 15.6 (H) 12.0 - 15.0 g/dL   HCT 46.0 36.0 - 46.0 %  Lactic acid, plasma     Status: Abnormal   Collection Time: 06/25/22 12:40 PM  Result Value Ref Range   Lactic Acid, Venous 3.5 (HH) 0.5 - 1.9 mmol/L    Comment: CRITICAL VALUE NOTED. VALUE IS CONSISTENT WITH PREVIOUSLY REPORTED/CALLED VALUE Performed at [place] [address] [place], [place] [address]    CT Head Wo Contrast  Result Date: 06/25/2022 CLINICAL DATA:  Fall EXAM: CT HEAD WITHOUT CONTRAST CT CERVICAL SPINE WITHOUT CONTRAST CT THORACIC SPINE WITHOUT CONTRAST CT LUMBAR  SPINE WITHOUT CONTRAST TECHNIQUE: Multidetector CT imaging of the head and cervical, thoracic, and lumbar spine was performed following the standard protocol without intravenous  contrast. Multiplanar CT image reconstructions of the cervical spine were also generated. RADIATION DOSE REDUCTION: This exam was performed according to the departmental dose-optimization program which includes automated exposure control, adjustment of the mA and/or kV according to patient size and/or use of iterative reconstruction technique. COMPARISON:  None Available. FINDINGS: CT HEAD FINDINGS Brain: No evidence of acute infarction, hemorrhage, hydrocephalus, extra-axial collection or mass lesion/mass effect. Vascular: No hyperdense vessel or unexpected calcification. Skull: Normal. Negative for fracture or focal lesion. Sinuses/Orbits: No middle ear or mastoid effusion. Paranasal sinuses are notable for trace mucosal thickening of the floor of the right maxillary sinus. Orbits are unremarkable. Other: None. CT CERVICAL, THORACIC, LUMBAR SPINE FINDINGS Alignment: Straightening of the normal cervical lordosis. Skull base and vertebrae: Cervical spine - no acute fracture. No primary bone lesion or focal pathologic process. Thoracic spine - mild superior endplate height loss at T11 is favored to be secondary to a Schmorl's node. Lumbar spine - compared to 03/21/22, there is a new mild superior endplate compression deformity at L4. Unchanged Schmorl's node at L3. Soft tissues and spinal canal: No prevertebral fluid or swelling. No visible canal hematoma. Disc levels:  No evidence of high-grade spinal canal stenosis. Visualized chest: Negative. Please see same day CT chest abdomen and pelvis for additional findings, including a small left posterior diaphragmatic hernia, which is likely congenital. Other: There are few prominent cervical lymph nodes, which are nonspecific, and likely reactive. IMPRESSION: 1. No CT evidence of intracranial injury. 2. No acute fracture or traumatic listhesis in the cervical or thoracic spine 3. New mild superior endplate compression deformity at L4,  favored to be acute. Electronically  Signed   By: Marin Roberts M.D.   On: 06/25/2022 12:23   CT Cervical Spine Wo Contrast  Result Date: 06/25/2022 CLINICAL DATA:  Fall EXAM: CT HEAD WITHOUT CONTRAST CT CERVICAL SPINE WITHOUT CONTRAST CT THORACIC SPINE WITHOUT CONTRAST CT LUMBAR  SPINE WITHOUT CONTRAST TECHNIQUE: Multidetector CT imaging of the head and cervical, thoracic, and lumbar spine was performed following the standard protocol without intravenous contrast. Multiplanar CT image reconstructions of the cervical spine were also generated. RADIATION DOSE REDUCTION: This exam was performed according to the departmental dose-optimization program which includes automated exposure control, adjustment of the mA and/or kV according to patient size and/or use of iterative reconstruction technique. COMPARISON:  None Available. FINDINGS: CT HEAD FINDINGS Brain: No evidence of acute infarction, hemorrhage, hydrocephalus, extra-axial collection or mass lesion/mass effect. Vascular: No hyperdense vessel or unexpected calcification. Skull: Normal. Negative for fracture or focal lesion. Sinuses/Orbits: No middle ear or mastoid effusion. Paranasal sinuses are notable for trace mucosal thickening of the floor of the right maxillary sinus. Orbits are unremarkable. Other: None. CT CERVICAL, THORACIC, LUMBAR SPINE FINDINGS Alignment: Straightening of the normal cervical lordosis. Skull base and vertebrae: Cervical spine - no acute fracture. No primary bone lesion or focal pathologic process. Thoracic spine - mild superior endplate height loss at T11 is favored to be secondary to a Schmorl's node. Lumbar spine - compared to 03/21/22, there is a new mild superior endplate compression deformity at L4. Unchanged Schmorl's node at L3. Soft tissues and spinal canal: No prevertebral fluid or swelling. No visible canal hematoma. Disc levels:  No evidence of high-grade spinal canal stenosis. Visualized chest: Negative. Please see same day CT chest abdomen and pelvis for  additional findings, including a small left posterior diaphragmatic hernia, which is likely congenital. Other: There are few prominent cervical lymph nodes, which are nonspecific, and likely reactive. IMPRESSION: 1. No CT evidence of intracranial injury. 2. No acute fracture or traumatic listhesis in the cervical or thoracic spine 3. New mild superior endplate compression deformity at L4, favored to be acute. Electronically Signed   By: Marin Roberts M.D.   On: 06/25/2022 12:23   CT T-SPINE NO CHARGE  Result Date: 06/25/2022 CLINICAL DATA:  Fall EXAM: CT HEAD WITHOUT CONTRAST CT CERVICAL SPINE WITHOUT CONTRAST CT THORACIC SPINE WITHOUT CONTRAST CT LUMBAR  SPINE WITHOUT CONTRAST TECHNIQUE: Multidetector CT imaging of the head and cervical, thoracic, and lumbar spine was performed following the standard protocol without intravenous contrast. Multiplanar CT image reconstructions of the cervical spine were also generated. RADIATION DOSE REDUCTION: This exam was performed according to the departmental dose-optimization program which includes automated exposure control, adjustment of the mA and/or kV according to patient size and/or use of iterative reconstruction technique. COMPARISON:  None Available. FINDINGS: CT HEAD FINDINGS Brain: No evidence of acute infarction, hemorrhage, hydrocephalus, extra-axial collection or mass lesion/mass effect. Vascular: No hyperdense vessel or unexpected calcification. Skull: Normal. Negative for fracture or focal lesion. Sinuses/Orbits: No middle ear or mastoid effusion. Paranasal sinuses are notable for trace mucosal thickening of the floor of the right maxillary sinus. Orbits are unremarkable. Other: None. CT CERVICAL, THORACIC, LUMBAR SPINE FINDINGS Alignment: Straightening of the normal cervical lordosis. Skull base and vertebrae: Cervical spine - no acute fracture. No primary bone lesion or focal pathologic process. Thoracic spine - mild superior endplate height loss at T11 is  favored to be secondary to a Schmorl's node. Lumbar spine - compared to 03/21/22, there is a new  mild superior endplate compression deformity at L4. Unchanged Schmorl's node at L3. Soft tissues and spinal canal: No prevertebral fluid or swelling. No visible canal hematoma. Disc levels:  No evidence of high-grade spinal canal stenosis. Visualized chest: Negative. Please see same day CT chest abdomen and pelvis for additional findings, including a small left posterior diaphragmatic hernia, which is likely congenital. Other: There are few prominent cervical lymph nodes, which are nonspecific, and likely reactive. IMPRESSION: 1. No CT evidence of intracranial injury. 2. No acute fracture or traumatic listhesis in the cervical or thoracic spine 3. New mild superior endplate compression deformity at L4, favored to be acute. Electronically Signed   By: Marin Roberts M.D.   On: 06/25/2022 12:23   CT L-SPINE NO CHARGE  Result Date: 06/25/2022 CLINICAL DATA:  Fall EXAM: CT HEAD WITHOUT CONTRAST CT CERVICAL SPINE WITHOUT CONTRAST CT THORACIC SPINE WITHOUT CONTRAST CT LUMBAR  SPINE WITHOUT CONTRAST TECHNIQUE: Multidetector CT imaging of the head and cervical, thoracic, and lumbar spine was performed following the standard protocol without intravenous contrast. Multiplanar CT image reconstructions of the cervical spine were also generated. RADIATION DOSE REDUCTION: This exam was performed according to the departmental dose-optimization program which includes automated exposure control, adjustment of the mA and/or kV according to patient size and/or use of iterative reconstruction technique. COMPARISON:  None Available. FINDINGS: CT HEAD FINDINGS Brain: No evidence of acute infarction, hemorrhage, hydrocephalus, extra-axial collection or mass lesion/mass effect. Vascular: No hyperdense vessel or unexpected calcification. Skull: Normal. Negative for fracture or focal lesion. Sinuses/Orbits: No middle ear or mastoid effusion.  Paranasal sinuses are notable for trace mucosal thickening of the floor of the right maxillary sinus. Orbits are unremarkable. Other: None. CT CERVICAL, THORACIC, LUMBAR SPINE FINDINGS Alignment: Straightening of the normal cervical lordosis. Skull base and vertebrae: Cervical spine - no acute fracture. No primary bone lesion or focal pathologic process. Thoracic spine - mild superior endplate height loss at T11 is favored to be secondary to a Schmorl's node. Lumbar spine - compared to 03/21/22, there is a new mild superior endplate compression deformity at L4. Unchanged Schmorl's node at L3. Soft tissues and spinal canal: No prevertebral fluid or swelling. No visible canal hematoma. Disc levels:  No evidence of high-grade spinal canal stenosis. Visualized chest: Negative. Please see same day CT chest abdomen and pelvis for additional findings, including a small left posterior diaphragmatic hernia, which is likely congenital. Other: There are few prominent cervical lymph nodes, which are nonspecific, and likely reactive. IMPRESSION: 1. No CT evidence of intracranial injury. 2. No acute fracture or traumatic listhesis in the cervical or thoracic spine 3. New mild superior endplate compression deformity at L4, favored to be acute. Electronically Signed   By: Marin Roberts M.D.   On: 06/25/2022 12:23   CT CHEST ABDOMEN PELVIS W CONTRAST  Result Date: 06/25/2022 CLINICAL DATA:  52 year old female status post two falls recently. Pain, nausea vomiting. EXAM: CT CHEST, ABDOMEN, AND PELVIS WITH CONTRAST TECHNIQUE: Multidetector CT imaging of the chest, abdomen and pelvis was performed following the standard protocol during bolus administration of intravenous contrast. RADIATION DOSE REDUCTION: This exam was performed according to the departmental dose-optimization program which includes automated exposure control, adjustment of the mA and/or kV according to patient size and/or use of iterative reconstruction technique.  CONTRAST:  124m OMNIPAQUE IOHEXOL 300 MG/ML  SOLN COMPARISON:  CTA chest 03/03/2022. CT thoracic and lumbar spine today reported separately. CT Abdomen and Pelvis 05/21/2022. FINDINGS: CT CHEST FINDINGS Cardiovascular: Negative. No  pericardial effusion. No significant atherosclerosis identified. Mediastinum/Nodes: Negative. No mediastinal hematoma or lymphadenopathy. Lungs/Pleura: Major airways are patent. Similar lung volumes to last year. Chronic elevation of the right hemidiaphragm. Mild linear right lung base scarring/atelectasis. No pneumothorax, pleural effusion, pulmonary contusion, or other abnormal lung opacity. Musculoskeletal: Thoracic spine is detailed separately. Visible shoulder osseous structures appear intact. No acute sternal fracture. Chronic rib fractures (such as right anterior 3rd rib). No acute rib fracture identified. CT ABDOMEN PELVIS FINDINGS Hepatobiliary: Chronic fatty liver. Liver and gallbladder appear intact. No perihepatic fluid. No bile duct enlargement. Pancreas: Partial atrophy. Spleen: Negative. Adrenals/Urinary Tract: Negative. Stomach/Bowel: Negative large bowel, much of the large bowel appears stable and negative, including retrocecal appendix (series 2, image 91). Conspicuous caliber change in the ascending colon along the 3 cm segment on coronal image 62 is new compared to January and probably peristalsis artifact. Negative terminal ileum. No dilated small bowel. Stomach and duodenum appear negative. No free air or free fluid identified. Vascular/Lymphatic: Calcified aortic atherosclerosis. Major arterial structures appear patent and intact. Portal venous system is patent. No lymphadenopathy identified. Reproductive: Negative. Other: No pelvis free fluid. Musculoskeletal: Lumbar spine detailed separately., and Sacrum, SI joints, pelvis (chronic inferior pubic rami fractures) proximal femurs appear stable and intact. No superficial soft tissue injury identified. IMPRESSION: 1.  No acute traumatic injury identified in the chest, abdomen, or pelvis. See Thoracic and Lumbar Spine CT reported separately. 2. Conspicuous caliber change in the ascending colon, but that same segment appeared negative in January, so suspect peristalsis artifact. Recommend up-to-date colon cancer screening for this patient. 3. Fatty liver. Electronically Signed   By: Genevie Ann M.D.   On: 06/25/2022 12:21    Pending Labs Unresulted Labs (From admission, onward)     Start     Ordered   06/25/22 1307  Blood culture (routine x 2)  BLOOD CULTURE X 2,   R (with STAT occurrences)      06/25/22 1306   06/25/22 1307  Resp panel by RT-PCR (RSV, Flu A&B, Covid) Anterior Nasal Swab  (Septic presentation on arrival (screening labs, nursing and treatment orders for obvious sepsis))  Once,   URGENT        06/25/22 1306   06/25/22 1307  Protime-INR  (Septic presentation on arrival (screening labs, nursing and treatment orders for obvious sepsis))  ONCE - STAT,   STAT        06/25/22 1306   06/25/22 1307  APTT  (Septic presentation on arrival (screening labs, nursing and treatment orders for obvious sepsis))  ONCE - STAT,   STAT        06/25/22 1306   06/25/22 1307  Urinalysis, w/ Reflex to Culture (Infection Suspected) -Urine, Clean Catch  (Septic presentation on arrival (screening labs, nursing and treatment orders for obvious sepsis))  Once,   URGENT       Question:  Specimen Source  Answer:  Urine, Clean Catch   06/25/22 1306            Vitals/Pain Today's Vitals   06/25/22 1127 06/25/22 1218 06/25/22 1258 06/25/22 1350  BP:  (!) 96/55    Pulse:  69    Resp:  14    Temp:    97.8 F (36.6 C)  TempSrc:    Oral  SpO2:  97%    Weight:      Height:      PainSc: 6   3      Isolation Precautions No active isolations  Medications Medications  potassium chloride 10 mEq in 100 mL IVPB (10 mEq Intravenous New Bag/Given 06/25/22 1258)  lactated ringers infusion ( Intravenous New Bag/Given 06/25/22  1337)  lactated ringers bolus 1,000 mL (1,000 mLs Intravenous New Bag/Given 06/25/22 1341)  ceFEPIme (MAXIPIME) 2 g in sodium chloride 0.9 % 100 mL IVPB (has no administration in time range)  metroNIDAZOLE (FLAGYL) IVPB 500 mg (500 mg Intravenous New Bag/Given 06/25/22 1341)  vancomycin (VANCOREADY) IVPB 1750 mg/350 mL (has no administration in time range)  lactated ringers bolus 1,000 mL (0 mLs Intravenous Stopped 06/25/22 1227)  fentaNYL (SUBLIMAZE) injection 25 mcg (25 mcg Intravenous Given 06/25/22 1050)  ondansetron (ZOFRAN) injection 4 mg (4 mg Intravenous Given 06/25/22 1049)  sodium chloride (PF) 0.9 % injection (  Given by Other 06/25/22 1127)  iohexol (OMNIPAQUE) 300 MG/ML solution 100 mL (100 mLs Intravenous Contrast Given 06/25/22 1130)  magnesium sulfate IVPB 2 g 50 mL (0 g Intravenous Stopped 06/25/22 1256)  lactated ringers bolus 1,000 mL (0 mLs Intravenous Stopped 06/25/22 1330)  fentaNYL (SUBLIMAZE) injection 25 mcg (25 mcg Intravenous Given 06/25/22 1237)    Mobility walks     Focused Assessments Neuro Assessment Handoff:  Swallow screen pass?  N/A         Neuro Assessment:   Neuro Checks:      Has TPA been given? No If patient is a Neuro Trauma and patient is going to OR before floor call report to [place] nurse: [phone] or [phone]   R Recommendations: See Admitting Provider Note  Report given to:   Additional Notes:

## 2022-06-25 NOTE — ED Triage Notes (Addendum)
Patient said she has had 2 falls, yesterday and Saturday. She said she lost her balance. No LOC. No blood thinners. Hit her head and her back and right shoulder. Has increased nausea and vomiting related to pain. Complaining of back pain and lower abdominal pain.  Took 1 percocet 7.'5mg'$  before arrival.

## 2022-06-25 NOTE — Assessment & Plan Note (Signed)
Hold antihypertensive medications for now due to hypotension

## 2022-06-25 NOTE — H&P (Addendum)
History and Physical    Patient: [name] [id number] DOB: [birth year] DOA: 06/25/2022 DOS: the patient was seen and examined on 06/25/2022 PCP: Ferd Hibbs, NP  Patient coming from: Home  Chief Complaint:  Chief Complaint  Patient presents with   Fall   HPI: [name] is a 52 y.o. female with medical history significant for obesity, hypertension, diabetes mellitus, chronic low back pain, degenerative disc disease, anxiety disorder who presents to the emergency room for evaluation of several falls over the weekend. Her initial fall was 2 days prior to presentation, according to the patient she got up at about 3 AM to use the bathroom and tripped over a wire resulting in the fall.  She states that she was dizzy and lightheaded prior to that.  She also had another fall 1 day prior to presentation and presents to the ER for evaluation of worsening low back pain. She states that she has had 2 days of nausea, vomiting and diarrhea and has been unable to tolerate any oral intake.  She also has a cough productive of yellow phlegm but denies having any sick contacts. She denies having any abdominal pain, no fever, no chills, no cough, no headache, no urinary symptoms, no focal deficits or blurred vision. She rates her low back pain a 6 x 10 in intensity at its worst.  She has no lower extremity weakness, no urinary or fecal incontinence and no saddle anesthesia. Upon arrival to the ER she was hypotensive with systolic blood pressure of 82 mmHg.  This has improved with IV fluid hydration Abnormal labs include lactate 5.9 >> 3.5, potassium of 3.0, magnesium of 1.3, white count of 11.1 and significant pyuria. She received 3 L IV fluid bolus in the ER as well as a dose of IV Flagyl, vancomycin and cefepime. Imaging showed  New mild superior endplate compression deformity at L4, favored to be acute.  Orthopedic surgery was consulted in the ER and they recommended LSO brace. She will be  admitted to the hospital for further evaluation.    Review of Systems: As mentioned in the history of present illness. All other systems reviewed and are negative. Past Medical History:  Diagnosis Date   Anxiety    Arthritis    "joints ache all over" (10/15/2014)   Barrett's esophagus    Bulging lumbar disc    Chronic lower back pain    DDD (degenerative disc disease), cervical    Depression    DM (diabetes mellitus) ([place])    Drug-seeking behavior    Headache    "weekly" (10/15/2014)   Hyperlipemia    Hypertension    PTSD (post-traumatic stress disorder)    Skin cancer    "had them cut off my arms; don't know what kind"   Past Surgical History:  Procedure Laterality Date   ABLATION ON ENDOMETRIOSIS  2008   BIOPSY  12/27/2018   Procedure: BIOPSY;  Surgeon: Thornton Park, MD;  Location: [facility] ENDOSCOPY;  Service: Gastroenterology;;   BIOPSY  10/05/2021   Procedure: BIOPSY;  Surgeon: Gatha Mayer, MD;  Location: [facility] ENDOSCOPY;  Service: Gastroenterology;;   ESOPHAGOGASTRODUODENOSCOPY (EGD) WITH PROPOFOL N/A 12/27/2018   Procedure: ESOPHAGOGASTRODUODENOSCOPY (EGD) WITH PROPOFOL;  Surgeon: Thornton Park, MD;  Location: [facility] ENDOSCOPY;  Service: Gastroenterology;  Laterality: N/A;   ESOPHAGOGASTRODUODENOSCOPY (EGD) WITH PROPOFOL N/A 10/05/2021   Procedure: ESOPHAGOGASTRODUODENOSCOPY (EGD) WITH PROPOFOL;  Surgeon: Gatha Mayer, MD;  Location: [place];  Service: Gastroenterology;  Laterality: N/A;   HEMORRHOID SURGERY  ~  2002   IR FLUORO GUIDE CV LINE RIGHT  06/14/2020   IR REMOVAL TUN CV CATH W/O FL  06/23/2020   IR US GUIDE VASC ACCESS RIGHT  06/14/2020   ORIF ANKLE FRACTURE Right 03/28/2020   Procedure: OPEN REDUCTION INTERNAL FIXATION (ORIF) RIGHT BIMALLEOLAR ANKLE FRACTURE;  Surgeon: Marchia Bond, MD;  Location: [place];  Service: Orthopedics;  Laterality: Right;   Social History:  reports that she has never smoked. She has never used smokeless tobacco. She  reports that she does not currently use alcohol. She reports that she does not use drugs.  No Known Allergies  Family History  Problem Relation Age of Onset   Breast cancer Mother    Diabetes Mother    Breast cancer Maternal Grandmother    Breast cancer Paternal Grandmother    Colon polyps Paternal Grandmother    Colon cancer Neg Hx    Esophageal cancer Neg Hx    Rectal cancer Neg Hx    Stomach cancer Neg Hx     Prior to Admission medications   Medication Sig Start Date End Date Taking? Authorizing Provider  acetaminophen (TYLENOL) 500 MG tablet Take 1,000 mg by mouth as needed for mild pain or headache.   Yes [provider]  ARIPiprazole (ABILIFY) 5 MG tablet Take 1 tablet (5 mg total) by mouth daily. 08/01/20  Yes Jamse Arn, MD  atorvastatin (LIPITOR) 10 MG tablet Take 10 mg by mouth every evening. 08/26/20  Yes [provider]  FLUoxetine (PROZAC) 40 MG capsule Take 1 capsule (40 mg total) by mouth daily. 07/01/20  Yes Angiulli, Lavon Paganini, PA-C  ibuprofen (ADVIL) 200 MG tablet Take 400 mg by mouth every 6 (six) hours as needed for moderate pain.   Yes [provider]  levothyroxine (SYNTHROID) 50 MCG tablet Take 50 mcg by mouth daily before breakfast.   Yes [provider]  lidocaine (XYLOCAINE) 5 % ointment Apply 1 Application topically daily as needed for mild pain. 02/26/22  Yes [provider]  metFORMIN (GLUCOPHAGE) 500 MG tablet Take 500 mg by mouth 2 (two) times daily with a meal.   Yes [provider]  metoCLOPramide (REGLAN) 5 MG tablet Take 1 tablet (5 mg total) by mouth 3 (three) times daily before meals for 5 days. 04/27/22 06/25/22 Yes Antonieta Pert, MD  multivitamin (RENA-VIT) TABS tablet Take 1 tablet by mouth at bedtime. 08/01/20  Yes Jamse Arn, MD  ondansetron (ZOFRAN-ODT) 8 MG disintegrating tablet Take 1 tablet (8 mg total) by mouth every 8 (eight) hours as needed for nausea or vomiting. Patient taking  differently: Take 8 mg by mouth as needed for nausea or vomiting. 05/03/22  Yes Thornton Park, MD  OVER THE COUNTER MEDICATION Apply 1 Application topically as needed (itching). OTC Anti itch cream   Yes [provider]  oxyCODONE-acetaminophen (PERCOCET) 7.5-325 MG tablet Take 1 tablet by mouth 4 (four) times daily.   Yes [provider]  pantoprazole (PROTONIX) 40 MG tablet Take 1 tablet (40 mg total) by mouth 2 (two) times daily. 05/03/22 05/03/23 Yes Thornton Park, MD  prazosin (MINIPRESS) 2 MG capsule Take 1 capsule (2 mg total) by mouth at bedtime. 08/01/20  Yes Jamse Arn, MD  promethazine (PHENERGAN) 25 MG suppository Place 1 suppository (25 mg total) rectally every 6 (six) hours as needed for nausea or vomiting. Patient taking differently: Place 25 mg rectally 2 (two) times daily as needed for nausea or vomiting. 12/27/21  Yes  Drenda Freeze, MD  promethazine (PHENERGAN) 25 MG tablet Take 25 mg by mouth as needed for nausea or vomiting.   Yes [provider]  tiZANidine (ZANAFLEX) 4 MG tablet Take 3 tablets (12 mg total) by mouth every 8 (eight) hours as needed for up to 12 doses for muscle spasms. Patient taking differently: Take 12 mg by mouth 4 (four) times daily as needed for muscle spasms. 04/27/22  Yes Antonieta Pert, MD  traZODone (DESYREL) 50 MG tablet Take 3 tablets (150 mg total) by mouth at bedtime. 09/21/21  Yes Elodia Florence., MD  ondansetron (ZOFRAN-ODT) 4 MG disintegrating tablet Take 4 mg by mouth every 6 (six) hours as needed for nausea or vomiting. Patient not taking: Reported on 06/25/2022 06/13/22   [provider]    Physical Exam: Vitals:   06/25/22 1101 06/25/22 1218 06/25/22 1350 06/25/22 1536  BP: (!) 89/67 (!) 96/55  113/73  Pulse: 84 69  81  Resp: '16 14  16  '$ Temp:   97.8 F (36.6 C) 98.6 F (37 C)  TempSrc:   Oral   SpO2: 97% 97%  100%  Weight:      Height:       Physical Exam Vitals and nursing note  reviewed.  Constitutional:      Appearance: She is obese.  HENT:     Head: Normocephalic.     Nose: Nose normal.     Mouth/Throat:     Mouth: Mucous membranes are dry.  Eyes:     Conjunctiva/sclera: Conjunctivae normal.  Cardiovascular:     Rate and Rhythm: Tachycardia present.  Pulmonary:     Effort: Pulmonary effort is normal.     Breath sounds: Normal breath sounds.  Abdominal:     General: Bowel sounds are normal.     Palpations: Abdomen is soft.  Musculoskeletal:        General: Normal range of motion.     Cervical back: Normal range of motion and neck supple.  Skin:    General: Skin is warm and dry.  Neurological:     General: No focal deficit present.     Mental Status: She is oriented to person, place, and time.  Psychiatric:        Mood and Affect: Mood normal.        Behavior: Behavior normal.     Data Reviewed: Relevant notes from primary care and specialist visits, past discharge summaries as available in EHR, including Care Everywhere. Prior diagnostic testing as pertinent to current admission diagnoses Updated medications and problem lists for reconciliation ED course, including vitals, labs, imaging, treatment and response to treatment Triage notes, nursing and pharmacy notes and ED provider's notes Notable results as noted in HPI Labs reviewed.  Lactic acid 5.9 >> 3.5, sodium 138, potassium 3.0, chloride 104, bicarb 18, glucose 137, BUN 9, creatinine 1.09, calcium 8.8, total protein 7.5, albumin 3.1, AST 49, ALT 31, alkaline phosphatase 119, total bilirubin 0.7, magnesium 1.3, white count 11.1, hemoglobin 15.1, hematocrit 45.7, platelet count 377 Respiratory panel is negative CT scan of abdomen and pelvis shows no acute traumatic injury identified in the chest, abdomen, or pelvis. See Thoracic and Lumbar Spine CT reported separately. Conspicuous caliber change in the ascending colon, but that same segment appeared negative in January, so suspect peristalsis  artifact. Recommend up-to-date colon cancer screening for this patient. Fatty liver. CT scan of cervical, thoracic and lumbar spine showed no CT evidence of intracranial injury. No acute fracture  or traumatic listhesis in the cervical or thoracic spine. New mild superior endplate compression deformity at L4, favored to be acute. CT scan of the head without contrast showed No CT evidence of intracranial injury.  There are no new results to review at this time.  Assessment and Plan: * Sepsis secondary to UTI [facility]) As evidenced by hypotension that responded to fluid resuscitation, leukocytosis, marked lactic acidosis as well as pyuria Prior urine culture yielded E. coli sensitive to cephalosporins Continue aggressive IV fluid resuscitation Treat patient empirically with Rocephin 1 g IV daily Follow-up results of urine culture  Compression fracture of fourth lumbar vertebra [facility]) Patient with a history of chronic low back pain who presents to the ER for evaluation of worsening lower back pain following a fall Imaging shows new mild superior endplate compression deformity at L4, favored to be acute. Pain control Physical therapy evaluation Orthopedic surgery consult has been requested and they recommended an LSO brace  T2DM (type 2 diabetes mellitus) ([place]) Maintain consistent carbohydrate diet Check blood sugars AC meals Hold metformin  Essential hypertension Hold antihypertensive medications for now due to hypotension  Depression with anxiety Continue trazodone, Abilify and fluoxetine  Hypothyroidism Continue Synthroid  Hypomagnesemia Supplement magnesium  Hypokalemia Secondary to GI losses from nausea, vomiting and diarrhea Supplement potassium Check magnesium levels  Obesity (BMI 30-39.9) BMI 31 Complicates overall prognosis and care Lifestyle modification and exercise has been discussed with patient in detail  Falls Most likely related to hypotension as patient  complained of feeling dizzy and lightheaded when supine Was hypotensive upon arrival to the ER and responded to IV fluid resuscitation Place patient on fall precautions PT evaluation      Advance Care Planning:   Code Status: Full Code   Consults: Physical therapy  Family Communication: Greater than 50% of time was spent discussing patient's condition and plan of care with her at the bedside.  All questions and concerns have been addressed.  She verbalizes understanding and agrees to the plan.  Severity of Illness: The appropriate patient status for this patient is INPATIENT. Inpatient status is judged to be reasonable and necessary in order to provide the required intensity of service to ensure the patient's safety. The patient's presenting symptoms, physical exam findings, and initial radiographic and laboratory data in the context of their chronic comorbidities is felt to place them at high risk for further clinical deterioration. Furthermore, it is not anticipated that the patient will be medically stable for discharge from the hospital within 2 midnights of admission.   * I certify that at the point of admission it is my clinical judgment that the patient will require inpatient hospital care spanning beyond 2 midnights from the point of admission due to high intensity of service, high risk for further deterioration and high frequency of surveillance required.*  Author: Collier Bullock, MD 06/25/2022 4:10 PM  For on call review [email or web address].

## 2022-06-25 NOTE — Assessment & Plan Note (Addendum)
Klebsiella identified on urine culture 3/4, sensitivities reveal sensitivity to ceftriaxone Leukocytosis is improving.  Tachycardia persisting although this is more likely secondary to inappropriate sinus tachycardia. Lactic acidosis resolved. Continuing to treat with intravenous ceftriaxone as oral intake is currently tenuous Attempting to transition patient off of intravenous fluids today. Blood cultures remain without growth CT imaging on 3/4 did not reveal a definitive focus of infection.

## 2022-06-25 NOTE — ED Notes (Signed)
Patient transported to CT 

## 2022-06-25 NOTE — Assessment & Plan Note (Addendum)
Thought to be secondary to acute illness with hypotension and lightheadedness Treating underlying infection, hydrating patient with intravenous isotonic fluids Fall precautions PT/OT evaluation performed-recommending home health physical therapy.

## 2022-06-25 NOTE — Assessment & Plan Note (Signed)
Continue trazodone, Abilify and fluoxetine

## 2022-06-25 NOTE — Group Note (Signed)
Date:  06/25/2022 Time:  10:23 AM  Group Topic/Focus:  Goals Group:   The focus of this group is to help patients establish daily goals to achieve during treatment and discuss how the patient can incorporate goal setting into their daily lives to aide in recovery. Orientation:   The focus of this group is to educate the patient on the purpose and policies of crisis stabilization and provide a format to answer questions about their admission.  The group details unit policies and expectations of patients while admitted.    Participation Level:  Did Not Attend    Dub Mikes 06/25/2022, 10:23 AM

## 2022-06-25 NOTE — Assessment & Plan Note (Signed)
Secondary to GI losses from nausea, vomiting and diarrhea Supplement potassium Check magnesium levels 

## 2022-06-25 NOTE — Plan of Care (Signed)
  Problem: Activity: Goal: Risk for activity intolerance will decrease Outcome: Progressing   Problem: Pain Managment: Goal: General experience of comfort will improve Outcome: Progressing   Problem: Safety: Goal: Ability to remain free from injury will improve Outcome: Progressing   

## 2022-06-25 NOTE — Assessment & Plan Note (Signed)
-   Supplement magnesium 

## 2022-06-25 NOTE — Assessment & Plan Note (Addendum)
Continue Synthroid °

## 2022-06-25 NOTE — Assessment & Plan Note (Signed)
Maintain consistent carbohydrate diet Check blood sugars AC meals Hold metformin

## 2022-06-25 NOTE — Assessment & Plan Note (Signed)
BMI 31 Complicates overall prognosis and care Lifestyle modification and exercise has been discussed with patient in detail

## 2022-06-25 NOTE — ED Provider Notes (Signed)
[facility] Provider Note   CSN: [id number] Arrival date & time: 06/25/22  [id number]     History  Chief Complaint  Patient presents with   [name]    [name] is a 52 y.o. female.  52 year old female presents today following multiple falls.  Her initial fall was Saturday.  She states she got up around 3 AM to go to the bathroom when she tripped over a wire causing her to fall.  No head injury or loss of consciousness.  She states she was able to get back up, go to the bathroom and then get back to sleep.  She states yesterday she was a little lightheaded.  She again got up around 1 AM to go to the bathroom.  She states she fell from being lightheaded.  She states she fell directly on her back as well as her right side.  She is currently complaining of pain on the right side of her abdomen.  She states over the past few days she has had some productive cough but no shortness of breath or chest pain.  The history is provided by the patient. No language interpreter was used.       Home Medications Prior to Admission medications   Medication Sig Start Date End Date Taking? Authorizing Provider  acetaminophen (TYLENOL) 500 MG tablet Take 1,000 mg by mouth as needed for mild pain or headache.    [provider]  ARIPiprazole (ABILIFY) 5 MG tablet Take 1 tablet (5 mg total) by mouth daily. 08/01/20   Jamse Arn, MD  atorvastatin (LIPITOR) 10 MG tablet Take 10 mg by mouth every evening. 08/26/20   [provider]  FLUoxetine (PROZAC) 40 MG capsule Take 1 capsule (40 mg total) by mouth daily. 07/01/20   Angiulli, Lavon Paganini, PA-C  levothyroxine (SYNTHROID) 50 MCG tablet Take 50 mcg by mouth daily before breakfast.    [provider]  lidocaine (XYLOCAINE) 5 % ointment Apply 1 Application topically daily as needed for mild pain. 02/26/22   [provider]  metFORMIN (GLUCOPHAGE) 500 MG tablet Take 500 mg by mouth 2  (two) times daily with a meal.    [provider]  metoCLOPramide (REGLAN) 5 MG tablet Take 1 tablet (5 mg total) by mouth 3 (three) times daily before meals for 5 days. 04/27/22 05/21/22  Antonieta Pert, MD  multivitamin (RENA-VIT) TABS tablet Take 1 tablet by mouth at bedtime. 08/01/20   Jamse Arn, MD  ondansetron (ZOFRAN-ODT) 8 MG disintegrating tablet Take 1 tablet (8 mg total) by mouth every 8 (eight) hours as needed for nausea or vomiting. Patient taking differently: Take 8 mg by mouth as needed for nausea or vomiting. 05/03/22   Thornton Park, MD  OVER THE COUNTER MEDICATION Apply 1 Application topically as needed (itching). OTC Anti itch cream    [provider]  oxyCODONE-acetaminophen (PERCOCET) 7.5-325 MG tablet Take 1 tablet by mouth 4 (four) times daily.    [provider]  pantoprazole (PROTONIX) 40 MG tablet Take 1 tablet (40 mg total) by mouth 2 (two) times daily. 05/03/22 05/03/23  Thornton Park, MD  prazosin (MINIPRESS) 2 MG capsule Take 1 capsule (2 mg total) by mouth at bedtime. 08/01/20   Jamse Arn, MD  promethazine (PHENERGAN) 25 MG suppository Place 1 suppository (25 mg total) rectally every 6 (six) hours as needed for nausea or vomiting. Patient taking differently: Place 25 mg rectally 2 (two) times  daily as needed for nausea or vomiting. 12/27/21   Drenda Freeze, MD  promethazine (PHENERGAN) 25 MG tablet Take 25 mg by mouth as needed for nausea or vomiting.    [provider]  tiZANidine (ZANAFLEX) 4 MG tablet Take 3 tablets (12 mg total) by mouth every 8 (eight) hours as needed for up to 12 doses for muscle spasms. Patient taking differently: Take 12 mg by mouth 4 (four) times daily as needed for muscle spasms. 04/27/22   Antonieta Pert, MD  traZODone (DESYREL) 50 MG tablet Take 3 tablets (150 mg total) by mouth at bedtime. 09/21/21   Elodia Florence., MD      Allergies    Patient has no known allergies.    Review of  Systems   Review of Systems  Constitutional:  Negative for chills and fever.  Eyes:  Negative for visual disturbance.  Respiratory:  Positive for cough. Negative for shortness of breath.   Cardiovascular:  Negative for chest pain.  Gastrointestinal:  Positive for abdominal pain, nausea and vomiting.  Neurological:  Positive for light-headedness. Negative for syncope.  All other systems reviewed and are negative.   Physical Exam Updated Vital Signs BP (!) 89/67 (BP Location: Left Arm)   Pulse 84   Temp (!) 97.3 F (36.3 C) (Oral)   Resp 16   Ht '5\' 4"'$  (1.626 m)   Wt 83.9 kg   LMP 06/03/2018 (Approximate) Comment: neg hcg 05/10/20  SpO2 97%   BMI 31.76 kg/m  Physical Exam Vitals and nursing note reviewed.  Constitutional:      General: She is not in acute distress.    Appearance: Normal appearance. She is not ill-appearing.  HENT:     Head: Normocephalic and atraumatic.     Nose: Nose normal.  Eyes:     General: No scleral icterus.    Extraocular Movements: Extraocular movements intact.     Conjunctiva/sclera: Conjunctivae normal.  Cardiovascular:     Rate and Rhythm: Normal rate and regular rhythm.     Pulses: Normal pulses.     Heart sounds: Normal heart sounds.  Pulmonary:     Effort: Pulmonary effort is normal. No respiratory distress.     Breath sounds: Normal breath sounds. No wheezing or rales.  Abdominal:     General: There is no distension.     Tenderness: There is no abdominal tenderness.  Musculoskeletal:        General: Normal range of motion.     Cervical back: Normal range of motion.     Comments: Cervical, thoracic lumbar spine without tenderness palpation.  Some tenderness to palpation of the lumbar spine.  No step-offs.  Full range of motion of bilateral upper and lower extremities without tenderness to palpation of any major joints.  Good strength in bilateral upper and lower extremities.  Skin:    General: Skin is warm and dry.  Neurological:      General: No focal deficit present.     Mental Status: She is alert. Mental status is at baseline.     ED Results / Procedures / Treatments   Labs (all labs ordered are listed, but only abnormal results are displayed) Labs Reviewed  CBC WITH DIFFERENTIAL/PLATELET - Abnormal; Notable for the following components:      Result Value   WBC 11.1 (*)    Hemoglobin 15.1 (*)    All other components within normal limits  COMPREHENSIVE METABOLIC PANEL - Abnormal; Notable for the following components:  Potassium 3.0 (*)    CO2 18 (*)    Glucose, Bld 137 (*)    Creatinine, Ser 1.09 (*)    Calcium 8.8 (*)    Albumin 3.1 (*)    AST 49 (*)    Anion gap 16 (*)    All other components within normal limits  MAGNESIUM - Abnormal; Notable for the following components:   Magnesium 1.3 (*)    All other components within normal limits  LACTIC ACID, PLASMA - Abnormal; Notable for the following components:   Lactic Acid, Venous 5.9 (*)    All other components within normal limits  CBG MONITORING, ED - Abnormal; Notable for the following components:   Glucose-Capillary 139 (*)    All other components within normal limits  I-STAT CHEM 8, ED - Abnormal; Notable for the following components:   Potassium 3.2 (*)    Glucose, Bld 135 (*)    Calcium, Ion 1.05 (*)    TCO2 20 (*)    Hemoglobin 15.6 (*)    All other components within normal limits  LACTIC ACID, PLASMA    EKG None  Radiology No results found.  Procedures .Critical Care  Performed by: Evlyn Courier, PA-C Authorized by: Evlyn Courier, PA-C   Critical care provider statement:    Critical care time (minutes):  35   Critical care was necessary to treat or prevent imminent or life-threatening deterioration of the following conditions:  Shock and sepsis   Critical care was time spent personally by me on the following activities:  Development of treatment plan with patient or surrogate, discussions with consultants, evaluation of patient's  response to treatment, examination of patient, ordering and review of laboratory studies, ordering and review of radiographic studies, ordering and performing treatments and interventions, pulse oximetry, re-evaluation of patient's condition and review of old charts   Care discussed with: admitting provider       Medications Ordered in ED Medications  magnesium sulfate IVPB 2 g 50 mL (2 g Intravenous New Bag/Given 06/25/22 1200)  potassium chloride 10 mEq in 100 mL IVPB (10 mEq Intravenous New Bag/Given 06/25/22 1158)  lactated ringers bolus 1,000 mL (1,000 mLs Intravenous New Bag/Given 06/25/22 1051)  fentaNYL (SUBLIMAZE) injection 25 mcg (25 mcg Intravenous Given 06/25/22 1050)  ondansetron (ZOFRAN) injection 4 mg (4 mg Intravenous Given 06/25/22 1049)  sodium chloride (PF) 0.9 % injection (  Given by Other 06/25/22 1127)  iohexol (OMNIPAQUE) 300 MG/ML solution 100 mL (100 mLs Intravenous Contrast Given 06/25/22 1130)  lactated ringers bolus 1,000 mL (1,000 mLs Intravenous New Bag/Given 06/25/22 1154)    ED Course/ Medical Decision Making/ A&P Clinical Course as of 06/25/22 1215  Mon Jun 25, 2022  1213 On reevaluation patient is resting comfortably browsing her following.  She states she is improved from when she arrived.  CT imaging still pending.  CBC shows mild leukocytosis, without anemia.  CMP shows hypokalemia at 3.0, preserved renal function otherwise without acute concerns.  Magnesium low at 1.3.  Lactic acidosis at 5.9.  Will continue IV fluid boluses. [AA]    Clinical Course User Index [AA] Evlyn Courier, PA-C                             Medical Decision Making Amount and/or Complexity of Data Reviewed Labs: ordered. Radiology: ordered.  Risk Prescription drug management. Decision regarding hospitalization.   Medical Decision Making / ED Course   This patient presents to  the ED for concern of fall, abdominal pain, vomiting, this involves an extensive number of treatment options, and  is a complaint that carries with it a high risk of complications and morbidity.  The differential diagnosis includes intra-abdominal injury, intracranial injury, multiple trauma, gastroenteritis, acute infection  MDM: 52 year old female with past medical history significant for hypertension, prior cardiac arrest with MI in [id number], cyclical vomiting syndrome, hypothyroidism, diabetes, bipolar presents today for evaluation of above-mentioned complaints.  Patient noted to be hypotensive upon arrival with systolic in the mid [id number].  Upon recheck firmly during interview and improved to systolic of about [id number].  However she was in Trendelenburg.  She does have multiple recent admissions for cyclical vomiting syndrome.  Today she is tachycardic, hypotensive in the setting of 2 recent falls with trauma to her back as well as right-sided abdominal wall.  She states over the past couple days she has had vomiting and diarrhea.  Diarrhea improved today.  Will provide pain control, obtain labs, and obtain CT imaging including CT chest abdomen pelvis with contrast as well as CT head and cervical spine.   Labs as noted above and ED course.  Initial lactic acidosis of 5.9 improved to 3.5 with fluids.  Will continue fluids.  Will order broad-spectrum antibiotics given she is SIRS positive remains hypotensive.  Although this may have been due to being volume down from recent symptoms of gastroenteritis.  Will discuss with hospitalist for admission.  She does have an L4 compression fracture otherwise no bony abnormality on imaging.  Will discuss with neurosurgery.  Case discussed with neurosurgery.  Spoke to [place].  She recommends LSO brace for comfort.  Can be taken off for showering.  Recommends outpatient follow-up with their clinic.  Spoke with hospitalist will evaluate patient for admission.  UA with evidence of UTI.  Likely patient's source of infection with combination of recent gastroenteritis symptoms and  dehydration.  Lab Tests: -I ordered, reviewed, and interpreted labs.   The pertinent results include:   Labs Reviewed  CBC WITH DIFFERENTIAL/PLATELET - Abnormal; Notable for the following components:      Result Value   WBC 11.1 (*)    Hemoglobin 15.1 (*)    All other components within normal limits  COMPREHENSIVE METABOLIC PANEL - Abnormal; Notable for the following components:   Potassium 3.0 (*)    CO2 18 (*)    Glucose, Bld 137 (*)    Creatinine, Ser 1.09 (*)    Calcium 8.8 (*)    Albumin 3.1 (*)    AST 49 (*)    Anion gap 16 (*)    All other components within normal limits  MAGNESIUM - Abnormal; Notable for the following components:   Magnesium 1.3 (*)    All other components within normal limits  LACTIC ACID, PLASMA - Abnormal; Notable for the following components:   Lactic Acid, Venous 5.9 (*)    All other components within normal limits  LACTIC ACID, PLASMA - Abnormal; Notable for the following components:   Lactic Acid, Venous 3.5 (*)    All other components within normal limits  CBG MONITORING, ED - Abnormal; Notable for the following components:   Glucose-Capillary 139 (*)    All other components within normal limits  I-STAT CHEM 8, ED - Abnormal; Notable for the following components:   Potassium 3.2 (*)    Glucose, Bld 135 (*)    Calcium, Ion 1.05 (*)    TCO2 20 (*)    Hemoglobin 15.6 (*)  All other components within normal limits  CULTURE, BLOOD (ROUTINE X 2)  CULTURE, BLOOD (ROUTINE X 2)  RESP PANEL BY RT-PCR (RSV, FLU A&B, COVID)  RVPGX2  PROTIME-INR  APTT  URINALYSIS, W/ REFLEX TO CULTURE (INFECTION SUSPECTED)      EKG  EKG Interpretation  Date/Time:    Ventricular Rate:    PR Interval:    QRS Duration:   QT Interval:    QTC Calculation:   R Axis:     Text Interpretation:           Imaging Studies ordered: I ordered imaging studies including CT head, CT cervical spine, CT chest abdomen pelvis with contrast, CT T and L-spine without  contrast I independently visualized and interpreted imaging. I agree with the radiologist interpretation   Medicines ordered and prescription drug management: Meds ordered this encounter  Medications   lactated ringers bolus 1,000 mL   fentaNYL (SUBLIMAZE) injection 25 mcg   ondansetron (ZOFRAN) injection 4 mg   sodium chloride (PF) 0.9 % injection    Antonieta Iba A: cabinet override   iohexol (OMNIPAQUE) 300 MG/ML solution 100 mL   magnesium sulfate IVPB 2 g 50 mL   potassium chloride 10 mEq in 100 mL IVPB   lactated ringers bolus 1,000 mL   lactated ringers infusion   lactated ringers bolus 1,000 mL    Order Specific Question:   Total Body Weight basis for 30 mL/kg  bolus delivery    Answer:   83.9 kg   fentaNYL (SUBLIMAZE) injection 25 mcg   ceFEPIme (MAXIPIME) 2 g in sodium chloride 0.9 % 100 mL IVPB    Order Specific Question:   Antibiotic Indication:    Answer:   Other Indication (list below)    Order Specific Question:   Other Indication:    Answer:   Unknown source   metroNIDAZOLE (FLAGYL) IVPB 500 mg    Order Specific Question:   Antibiotic Indication:    Answer:   Other Indication (list below)    Order Specific Question:   Other Indication:    Answer:   Unknown source   vancomycin (VANCOCIN) IVPB 1000 mg/200 mL premix    Order Specific Question:   Indication:    Answer:   Other Indication (list below)    Order Specific Question:   Other Indication:    Answer:   Unknown source    -I have reviewed the patients home medicines and have made adjustments as needed  Critical interventions Broad-spectrum antibiotics, large volume resuscitation   Cardiac Monitoring: The patient was maintained on a cardiac monitor.  I personally viewed and interpreted the cardiac monitored which showed an underlying rhythm of: Initially sinus tachycardia improved to normal sinus rhythm   Reevaluation: After the interventions noted above, I reevaluated the patient and found that they  have :improved  Co morbidities that complicate the patient evaluation  Past Medical History:  Diagnosis Date   Anxiety    Arthritis    "joints ache all over" (10/15/2014)   Barrett's esophagus    Bulging lumbar disc    Chronic lower back pain    DDD (degenerative disc disease), cervical    Depression    DM (diabetes mellitus) ([place])    Drug-seeking behavior    Headache    "weekly" (10/15/2014)   Hyperlipemia    Hypertension    PTSD (post-traumatic stress disorder)    Skin cancer    "had them cut off my arms; don't know what kind"  Dispostion: Patient discussed with hospitalist will evaluate patient for admission.  Final Clinical Impression(s) / ED Diagnoses Final diagnoses:  Septic shock ([place])  Lactic acidosis  Fall, initial encounter  Closed compression fracture of L4 lumbar vertebra, initial encounter ([place])  Acute cystitis without hematuria    Rx / DC Orders ED Discharge Orders     None         Evlyn Courier, PA-C 06/25/22 1440    Dorie Rank, MD 06/27/22 1156

## 2022-06-26 ENCOUNTER — Inpatient Hospital Stay (HOSPITAL_COMMUNITY): Payer: Medicare Other

## 2022-06-26 DIAGNOSIS — S32040A Wedge compression fracture of fourth lumbar vertebra, initial encounter for closed fracture: Secondary | ICD-10-CM

## 2022-06-26 DIAGNOSIS — A419 Sepsis, unspecified organism: Secondary | ICD-10-CM | POA: Diagnosis not present

## 2022-06-26 DIAGNOSIS — N390 Urinary tract infection, site not specified: Secondary | ICD-10-CM | POA: Diagnosis not present

## 2022-06-26 LAB — GLUCOSE, CAPILLARY
Glucose-Capillary: 112 mg/dL — ABNORMAL HIGH (ref 70–99)
Glucose-Capillary: 110 mg/dL — ABNORMAL HIGH (ref 70–99)
Glucose-Capillary: 169 mg/dL — ABNORMAL HIGH (ref 70–99)
Glucose-Capillary: 148 mg/dL — ABNORMAL HIGH (ref 70–99)

## 2022-06-26 LAB — CBC
HCT: 39.8 % (ref 36.0–46.0)
Hemoglobin: 12.9 g/dL (ref 12.0–15.0)
MCH: 30.2 pg (ref 26.0–34.0)
MCHC: 32.4 g/dL (ref 30.0–36.0)
MCV: 93.2 fL (ref 80.0–100.0)
Platelets: 307 10*3/uL (ref 150–400)
RBC: 4.27 MIL/uL (ref 3.87–5.11)
RDW: 13.6 % (ref 11.5–15.5)
WBC: 12.5 10*3/uL — ABNORMAL HIGH (ref 4.0–10.5)
nRBC: 0 % (ref 0.0–0.2)

## 2022-06-26 LAB — PROTIME-INR
INR: 1 (ref 0.8–1.2)
Prothrombin Time: 13.5 seconds (ref 11.4–15.2)

## 2022-06-26 LAB — BASIC METABOLIC PANEL
Anion gap: 11 (ref 5–15)
BUN: <5 mg/dL — ABNORMAL LOW (ref 6–20)
CO2: 20 mmol/L — ABNORMAL LOW (ref 22–32)
Calcium: 8.3 mg/dL — ABNORMAL LOW (ref 8.9–10.3)
Chloride: 108 mmol/L (ref 98–111)
Creatinine, Ser: 0.65 mg/dL (ref 0.44–1.00)
GFR, Estimated: >60 mL/min (ref >60)
Glucose, Bld: 107 mg/dL — ABNORMAL HIGH (ref 70–99)
Potassium: 3.9 mmol/L (ref 3.5–5.1)
Sodium: 139 mmol/L (ref 135–145)

## 2022-06-26 LAB — PROCALCITONIN: Procalcitonin: 0.26 ng/mL

## 2022-06-26 LAB — CORTISOL-AM, BLOOD: Cortisol - AM: 12.4 ug/dL (ref 6.7–22.6)

## 2022-06-26 MED ORDER — SODIUM CHLORIDE 0.9 % IV SOLN
2.0000 g | INTRAVENOUS | Status: DC
  Administered 2022-06-27 – 2022-06-30 (×5): 2 g via INTRAVENOUS
  Filled 2022-06-26 (×5): qty 20

## 2022-06-26 MED ORDER — PROCHLORPERAZINE EDISYLATE 10 MG/2ML IJ SOLN
10.0000 mg | Freq: Four times a day (QID) | INTRAMUSCULAR | Status: DC | PRN
  Administered 2022-06-26 – 2022-06-28 (×4): 10 mg via INTRAVENOUS
  Filled 2022-06-26 (×5): qty 2

## 2022-06-26 MED ORDER — HYDROMORPHONE HCL 1 MG/ML IJ SOLN
1.0000 mg | Freq: Once | INTRAMUSCULAR | Status: AC
  Administered 2022-06-26: 1 mg via INTRAVENOUS
  Filled 2022-06-26: qty 1

## 2022-06-26 MED ORDER — FENTANYL CITRATE PF 50 MCG/ML IJ SOSY
12.5000 ug | PREFILLED_SYRINGE | Freq: Once | INTRAMUSCULAR | Status: AC | PRN
  Administered 2022-06-26: 12.5 ug via INTRAVENOUS
  Filled 2022-06-26: qty 1

## 2022-06-26 MED ORDER — LACTATED RINGERS IV BOLUS
500.0000 mL | Freq: Once | INTRAVENOUS | Status: AC
  Administered 2022-06-26: 500 mL via INTRAVENOUS

## 2022-06-26 MED ORDER — METHOCARBAMOL 500 MG PO TABS
500.0000 mg | ORAL_TABLET | Freq: Three times a day (TID) | ORAL | Status: DC
  Administered 2022-06-26 – 2022-07-01 (×15): 500 mg via ORAL
  Filled 2022-06-26 (×15): qty 1

## 2022-06-26 MED ORDER — PROCHLORPERAZINE EDISYLATE 10 MG/2ML IJ SOLN
5.0000 mg | Freq: Once | INTRAMUSCULAR | Status: AC
  Administered 2022-06-26: 5 mg via INTRAVENOUS
  Filled 2022-06-26: qty 2

## 2022-06-26 MED ORDER — OXYCODONE HCL 5 MG PO TABS
5.0000 mg | ORAL_TABLET | Freq: Once | ORAL | Status: AC | PRN
  Administered 2022-06-26: 5 mg via ORAL
  Filled 2022-06-26: qty 1

## 2022-06-26 MED ORDER — LACTATED RINGERS IV SOLN: INTRAVENOUS | Status: DC

## 2022-06-26 MED ORDER — METOCLOPRAMIDE HCL 5 MG/ML IJ SOLN
10.0000 mg | Freq: Three times a day (TID) | INTRAMUSCULAR | Status: DC
  Administered 2022-06-26 – 2022-07-01 (×16): 10 mg via INTRAVENOUS
  Filled 2022-06-26 (×16): qty 2

## 2022-06-26 MED ORDER — LIDOCAINE 5 % EX PTCH
1.0000 | MEDICATED_PATCH | Freq: Once | CUTANEOUS | Status: AC
  Administered 2022-06-26: 1 via TRANSDERMAL
  Filled 2022-06-26: qty 1

## 2022-06-26 MED ORDER — ACETAMINOPHEN 325 MG PO TABS: 650.0000 mg | ORAL_TABLET | Freq: Four times a day (QID) | ORAL | Status: DC | PRN

## 2022-06-26 MED ORDER — HYDROMORPHONE HCL 1 MG/ML IJ SOLN
1.0000 mg | INTRAMUSCULAR | Status: DC | PRN
  Administered 2022-06-26 – 2022-07-01 (×24): 1 mg via INTRAVENOUS
  Filled 2022-06-26 (×26): qty 1

## 2022-06-26 NOTE — Progress Notes (Signed)
MEWS Progress Note  Patient Details Name: [name] MRN: [id number] DOB: 1970/10/30 Today's Date: 06/26/2022   MEWS Flowsheet Documentation:  Assess: MEWS Score Temp: 99.6 F (37.6 C) BP: (!) 154/93 MAP (mmHg): 111 Pulse Rate: (!) 134 ECG Heart Rate: 70 Resp: 18 Level of Consciousness: Alert SpO2: 96 % O2 Device: Room Air Assess: MEWS Score MEWS Temp: 0 MEWS Systolic: 0 MEWS Pulse: 3 MEWS RR: 0 MEWS LOC: 0 MEWS Score: 3 MEWS Score Color: Yellow Assess: SIRS CRITERIA SIRS Temperature : 0 SIRS Respirations : 0 SIRS Pulse: 1 SIRS WBC: 0 SIRS Score Sum : 1 Assess: if the MEWS score is Yellow or Red Were vital signs taken at a resting state?: Yes Focused Assessment: No change from prior assessment Does the patient meet 2 or more of the SIRS criteria?: No MEWS guidelines implemented : Yes, yellow Treat MEWS Interventions: Considered administering scheduled or prn medications/treatments as ordered Take Vital Signs Increase Vital Sign Frequency : Yellow: Q2hr x1, continue Q4hrs until patient remains green for 12hrs Escalate MEWS: Escalate: Yellow: Discuss with charge nurse and consider notifying provider and/or RRT Notify: Charge Nurse/RN Name of Charge Nurse/RN Notified: Zella Ball, RN Provider Notification Provider Name/Title: Leanne Chang, MD Date Provider Notified: 06/26/22 Time Provider Notified: [phone] Method of Notification: Page Notification Reason: Other (Comment) (Continued previous communication) Provider response: No new orders (New orders already written) Date of Provider Response: 06/26/22 Time of Provider Response: (P) 1008      [name] 06/26/2022, 11:16 AM

## 2022-06-26 NOTE — Progress Notes (Signed)
PT Cancellation Note  Patient Details Name: [name] MRN: [id number] DOB: October 16, 1970   Cancelled Treatment:    Reason Eval/Treat Not Completed: Other (comment); attempted earlier today, pt nauseous and not feeling well. Attempt again as schedule permits   [facility] 06/26/2022, 5:00 PM

## 2022-06-26 NOTE — Plan of Care (Signed)
  Problem: Education: Goal: Knowledge of General Education information will improve Description: Including pain rating scale, medication(s)/side effects and non-pharmacologic comfort measures Outcome: Progressing   Problem: Activity: Goal: Risk for activity intolerance will decrease Outcome: Progressing   Problem: Pain Managment: Goal: General experience of comfort will improve Outcome: Progressing   

## 2022-06-26 NOTE — Progress Notes (Signed)
[facility] PROGRESS NOTE  [name] (DOB: Sep 07, 1970) [id number] PCP: Ferd Hibbs, NP  Brief Narrative: [name] is a 52 y.o. female with a history of obesity, HTN, T2DM, chronic low back pain/DDD, anxiety who presented to the ED on 06/25/2022 with low back pain worsening acutely since a fall at home tripping over a cord, then had another fall associated with lightheadedness prompting her to seek evaluation. She was hypotensive, improved with IV fluids with leukocytosis (WBC 11.1k), lactic acidosis (5.9 > 3.5), with hypokalemia, hypomagnesemia. Urinalysis was strongly suggestive of UTI. CT examinations of the head, cervical spine, thoracic spine, lumbar spine, chest, abdomen and pelvis were performed. Positive findings were isolated to a new L4 compression deformity. Broad IV antibiotics were started, cultures sent, and patient admitted for sepsis due to UTI. With acute low back pain requiring IV analgesics in addition to home percocet, MRI performed and IR consulted for consideration of kyphoplasty.   Subjective: Feels better than when she got here, but hasn't had her pain medications as she normally does (QID every day for years) and has 10/10 pain. RN reported she was shaking in pain. Dilaudid given through IV given her nausea and vomiting improved pain to 6/10. Vomited all oral medications later in the morning. Reports low back pain that is new from her baseline chronic pain in the middle/left side radiation somewhat beginning right after the fall a couple days ago. No numbness, radiculopathy. She does say she had burning with urination.  Objective: BP (!) 129/95 (BP Location: Right Arm)   Pulse (!) 110   Temp 99.2 F (37.3 C) (Oral)   Resp 18   Ht '5\' 4"'$  (1.626 m)   Wt 83.9 kg   LMP 06/03/2018 (Approximate) Comment: neg hcg 05/10/20  SpO2 100%   BMI 31.76 kg/m   Gen: Chronically ill-appearing female in no distress Pulm: Clear, nonlabored  CV: Regular tachycardia  without MRG or edema.  GI: Soft, NT, ND, +BS  Neuro: Alert and oriented. No new focal deficits. Normal sensation and motor function in LE's specifically. MSK: Midline tenderness over L4, L > R paraspinal spasm.  Skin: No rashes, lesions or ulcers on visualized skin   Assessment & Plan: Sepsis due to UTI:  - Continue antibiotics. Hx going back to 2019 shows E. coli w/amp/sulbactam resistance, Acinetobacter without resistance, known MRSA colonization. Given vancomycin, cefepime, flagyl initially. Narrowed to ceftriaxone. We will continue pending cultures, though leukocytosis is persistent. Increase to 2g dose. Culture growing GNR thus far. No blood culture growth yet. - Remains tachycardic, not overloaded, give bolus IVF and maintenance as she continues having GI losses. BP elevated.   Acute on chronic back pain, acute L4 compression fracture:  - MRI performed showing 20% height loss and no bony retropulsion. Appreciate IR opinion Re: kyphoplasty.  - Continue chronic percocet scheduled, hopefully can keep down with antiemetics. Added dilaudid '1mg'$  IV q3h prn which we will continue for now. Add tylenol. Continue lidocaine patch. Add antispasmodic.   Nausea and vomiting, GERD: Likely due to sepsis.  - Restart home reglan, give IV for now - Continue IV zofran prn N/V, compazine prn refractory N/V.  - Continue PPI  Falls: First appears mechanical, next appears orthostatic.  - PT/OT.  - Check orthostatic vital signs  T2DM: Last HbA1c 5.4%.  - SSI - Hold metformin, a likely contributor to her lactic acid elevation  Mood disorder:  - Continue abilify, SSRI, trazodone  Hypothyroidism:  - Continue synthroid  HLD:  -  Continue statin  Hypokalemia, hypomagnesemia: Supplemented  Obesity: Body mass index is 31.76 kg/m.   Patrecia Pour, MD [facility] [email or web address] 06/26/2022, 6:02 PM

## 2022-06-27 ENCOUNTER — Inpatient Hospital Stay: Payer: Self-pay

## 2022-06-27 ENCOUNTER — Inpatient Hospital Stay (HOSPITAL_COMMUNITY): Payer: Medicare Other

## 2022-06-27 DIAGNOSIS — E876 Hypokalemia: Secondary | ICD-10-CM

## 2022-06-27 DIAGNOSIS — I10 Essential (primary) hypertension: Secondary | ICD-10-CM

## 2022-06-27 DIAGNOSIS — R000 Tachycardia, unspecified: Secondary | ICD-10-CM

## 2022-06-27 DIAGNOSIS — R112 Nausea with vomiting, unspecified: Secondary | ICD-10-CM

## 2022-06-27 DIAGNOSIS — F418 Other specified anxiety disorders: Secondary | ICD-10-CM

## 2022-06-27 DIAGNOSIS — W19XXXA Unspecified fall, initial encounter: Secondary | ICD-10-CM

## 2022-06-27 DIAGNOSIS — E872 Acidosis, unspecified: Secondary | ICD-10-CM | POA: Diagnosis not present

## 2022-06-27 DIAGNOSIS — A419 Sepsis, unspecified organism: Secondary | ICD-10-CM | POA: Diagnosis not present

## 2022-06-27 DIAGNOSIS — E119 Type 2 diabetes mellitus without complications: Secondary | ICD-10-CM

## 2022-06-27 DIAGNOSIS — S32040A Wedge compression fracture of fourth lumbar vertebra, initial encounter for closed fracture: Secondary | ICD-10-CM | POA: Diagnosis not present

## 2022-06-27 DIAGNOSIS — E1169 Type 2 diabetes mellitus with other specified complication: Secondary | ICD-10-CM | POA: Diagnosis present

## 2022-06-27 DIAGNOSIS — Y92009 Unspecified place in unspecified non-institutional (private) residence as the place of occurrence of the external cause: Secondary | ICD-10-CM

## 2022-06-27 DIAGNOSIS — E782 Mixed hyperlipidemia: Secondary | ICD-10-CM | POA: Diagnosis present

## 2022-06-27 DIAGNOSIS — E039 Hypothyroidism, unspecified: Secondary | ICD-10-CM

## 2022-06-27 LAB — CBC WITH DIFFERENTIAL/PLATELET
Abs Immature Granulocytes: 0 10*3/uL (ref 0.00–0.07)
Basophils Absolute: 0 10*3/uL (ref 0.0–0.1)
Basophils Relative: 0 %
Eosinophils Absolute: 0 10*3/uL (ref 0.0–0.5)
Eosinophils Relative: 0 %
HCT: 36.4 % (ref 36.0–46.0)
Hemoglobin: 12.4 g/dL (ref 12.0–15.0)
Lymphocytes Relative: 6 %
Lymphs Abs: 1.2 10*3/uL (ref 0.7–4.0)
MCH: 30.8 pg (ref 26.0–34.0)
MCHC: 34.1 g/dL (ref 30.0–36.0)
MCV: 90.3 fL (ref 80.0–100.0)
Monocytes Absolute: 0.6 10*3/uL (ref 0.1–1.0)
Monocytes Relative: 3 %
Neutro Abs: 18.1 10*3/uL — ABNORMAL HIGH (ref 1.7–7.7)
Neutrophils Relative %: 91 %
Platelets: 377 10*3/uL (ref 150–400)
RBC: 4.03 MIL/uL (ref 3.87–5.11)
RDW: 14.4 % (ref 11.5–15.5)
WBC: 19.9 10*3/uL — ABNORMAL HIGH (ref 4.0–10.5)
nRBC: 0 % (ref 0.0–0.2)

## 2022-06-27 LAB — COMPREHENSIVE METABOLIC PANEL
ALT: 24 U/L (ref 0–44)
AST: 47 U/L — ABNORMAL HIGH (ref 15–41)
Albumin: 2.6 g/dL — ABNORMAL LOW (ref 3.5–5.0)
Alkaline Phosphatase: 83 U/L (ref 38–126)
Anion gap: 12 (ref 5–15)
BUN: <5 mg/dL — ABNORMAL LOW (ref 6–20)
CO2: 19 mmol/L — ABNORMAL LOW (ref 22–32)
Calcium: 8.3 mg/dL — ABNORMAL LOW (ref 8.9–10.3)
Chloride: 111 mmol/L (ref 98–111)
Creatinine, Ser: 0.81 mg/dL (ref 0.44–1.00)
GFR, Estimated: >60 mL/min (ref >60)
Glucose, Bld: 166 mg/dL — ABNORMAL HIGH (ref 70–99)
Potassium: 3.3 mmol/L — ABNORMAL LOW (ref 3.5–5.1)
Sodium: 142 mmol/L (ref 135–145)
Total Bilirubin: 0.6 mg/dL (ref 0.3–1.2)
Total Protein: 6.3 g/dL — ABNORMAL LOW (ref 6.5–8.1)

## 2022-06-27 LAB — GLUCOSE, CAPILLARY
Glucose-Capillary: 146 mg/dL — ABNORMAL HIGH (ref 70–99)
Glucose-Capillary: 120 mg/dL — ABNORMAL HIGH (ref 70–99)
Glucose-Capillary: 95 mg/dL (ref 70–99)
Glucose-Capillary: 86 mg/dL (ref 70–99)

## 2022-06-27 LAB — MRSA NEXT GEN BY PCR, NASAL: MRSA by PCR Next Gen: NOT DETECTED

## 2022-06-27 MED ORDER — POTASSIUM CHLORIDE CRYS ER 20 MEQ PO TBCR
40.0000 meq | EXTENDED_RELEASE_TABLET | Freq: Once | ORAL | Status: AC
  Administered 2022-06-27: 40 meq via ORAL
  Filled 2022-06-27: qty 2

## 2022-06-27 MED ORDER — LORAZEPAM 2 MG/ML PO CONC
1.0000 mg | Freq: Once | ORAL | Status: AC
  Administered 2022-06-27: 1 mg via ORAL
  Filled 2022-06-27: qty 0.5

## 2022-06-27 MED ORDER — HYDRALAZINE HCL 20 MG/ML IJ SOLN
10.0000 mg | Freq: Four times a day (QID) | INTRAMUSCULAR | Status: DC | PRN
  Administered 2022-06-29: 10 mg via INTRAVENOUS
  Filled 2022-06-27 (×2): qty 1

## 2022-06-27 MED ORDER — LACTATED RINGERS IV SOLN: INTRAVENOUS | Status: AC

## 2022-06-27 NOTE — Progress Notes (Signed)
PROGRESS NOTE   [name]  [id number] DOB: Apr 15, 1971 DOA: 06/25/2022 PCP: Ferd Hibbs, NP   Date of Service: the patient was seen and examined on 06/27/2022  Brief Narrative:  52 y.o. female with medical history significant for obesity, hypertension, diabetes mellitus, chronic low back pain, degenerative disc disease, anxiety disorder who presents to the emergency room for evaluation of several falls over the weekend.  Upon arrival to the ER she was hypotensive with systolic blood pressure of 82 mmHg.  Imaging showed  New mild superior endplate compression deformity at L4, favored to be acute.  Orthopedic surgery was consulted in the ER and they recommended LSO brace.  She will be admitted to the hospital for further evaluation.   Assessment and Plan: * Sepsis secondary to UTI ([place]) Klebsiella identified on urine culture 3/4, awaiting sensitivities Persisting substantial SIRS criteria including tachycardia and leukocytosis Continuing to treat with intravenous ceftriaxone  Continue intravenous volume resuscitation Blood cultures remain without growth CT imaging on 3/4 did not reveal a definitive focus of infection.  Closed compression fracture of L4 lumbar vertebra, initial encounter ([place]) Continued complaints of severe pain  IR evaluated patient for possible kyphoplasty but is currently not proceeding until after active infection is treated Imaging on admission shows new mild superior endplate compression deformity at L4, favored Continue as needed opiate-based analgesics PT/OT evaluation Orthopedic surgery consult has been requested and they recommended an LSO brace  Sinus tachycardia Twelve-lead EKG confirms sinus tachycardia Recent TSH unremarkable Currently not febrile Tachycardia likely secondary to a combination of sepsis volume depletion and uncontrolled pain. Monitoring on telemetry, treating underlying illness  Lactic acidosis Lactic acidosis likely  secondary to sepsis with concurrent volume depletion Hydrate patient intravenous isotonic fluids while concurrently treating underlying infection Monitoring serial lactic acid levels to ensure downtrending and resolution.   Intractable nausea and vomiting Possibly secondary to underlying infection Treating with as needed antiemetics   Fall at home, initial encounter Thought to be secondary to acute illness with hypotension and lightheadedness Treating underlying infection, hydrating patient with intravenous isotonic fluids Fall precautions PT/OT evaluation   Type 2 diabetes mellitus without complication, without long-term current use of insulin (HCC) Hemoglobin A1c 5.3 on 1/30  Diabetic diet as tolerated  Accu-Cheks before every meal and at bedtime with sliding scale insulin  Holding oral hypoglycemics  Hypothyroidism Continue Synthroid  Hypomagnesemia Replaced  Essential hypertension As needed intravenous antihypertensives for markedly elevated blood pressure   Hypokalemia Recurrent  Replacing with potassium chloride Evaluating for concurrent hypomagnesemia  Monitoring potassium levels with serial chemistries.   Mixed diabetic hyperlipidemia associated with type 2 diabetes mellitus ([place]) Continuing home regimen of lipid lowering therapy.   Depression with anxiety Continue trazodone, Abilify and fluoxetine     Subjective:  Patient complaining of severe back pain.  Pain originates in the lumbar spine, sharp in quality, nonradiating and worse with movement.  Patient is also complaining of ongoing nausea and poor oral intake.  Physical Exam:  Vitals:   06/27/22 0420 06/27/22 0802 06/27/22 1314 06/27/22 1701  BP: 122/85 121/87 125/76 (!) 159/81  Pulse: (!) 152 (!) 143 (!) 141 (!) 123  Resp: '16 18 18 19  '$ Temp: 99.6 F (37.6 C) 98.7 F (37.1 C) 97.7 F (36.5 C) 97.9 F (36.6 C)  TempSrc:  Oral Oral   SpO2: 94% 91% 93% 100%  Weight:      Height:         Constitutional: Awake alert and oriented x3, patient is  in mild distress due to pain Skin: no rashes, no lesions, good skin turgor noted. Eyes: Pupils are equally reactive to light.  No evidence of scleral icterus or conjunctival pallor.  ENMT: Moist mucous membranes noted.  Posterior pharynx clear of any exudate or lesions.   Respiratory: clear to auscultation bilaterally, no wheezing, no crackles. Normal respiratory effort. No accessory muscle use.  Cardiovascular: Tachycardic rate with regular rhythm no murmurs / rubs / gallops. No extremity edema. 2+ pedal pulses. No carotid bruits.  Abdomen: Abdomen is soft and nontender.  No evidence of intra-abdominal masses.  Positive bowel sounds noted in all quadrants.   Back: Notable midline lumbar spinal tenderness without crepitus or deformity. Musculoskeletal: No joint deformity upper and lower extremities. Good ROM, no contractures. Normal muscle tone.    Data Reviewed:  I have personally reviewed and interpreted labs, imaging.  Significant findings are   CBC: Recent Labs  Lab 06/25/22 1038 06/25/22 1104 06/26/22 0651 06/27/22 0354  WBC 11.1*  --  12.5* 19.9*  NEUTROABS 7.3  --   --  18.1*  HGB 15.1* 15.6* 12.9 12.4  HCT 45.7 46.0 39.8 36.4  MCV 92.0  --  93.2 90.3  PLT 377  --  307 Q000111Q   Basic Metabolic Panel: Recent Labs  Lab 06/25/22 1038 06/25/22 1104 06/26/22 0651 06/27/22 0354  NA 138 140 139 142  K 3.0* 3.2* 3.9 3.3*  CL 104 105 108 111  CO2 18*  --  20* 19*  GLUCOSE 137* 135* 107* 166*  BUN 9 6 <5* <5*  CREATININE 1.09* 1.00 0.65 0.81  CALCIUM 8.8*  --  8.3* 8.3*  MG 1.3*  --   --   --    GFR: Estimated Creatinine Clearance: 86.1 mL/min (by C-G formula based on SCr of 0.81 mg/dL). Liver Function Tests: Recent Labs  Lab 06/25/22 1038 06/27/22 0354  AST 49* 47*  ALT 31 24  ALKPHOS 119 83  BILITOT 0.7 0.6  PROT 7.5 6.3*  ALBUMIN 3.1* 2.6*    Coagulation Profile: Recent Labs  Lab 06/25/22 1556  06/26/22 0651  INR 1.1 1.0     EKG/Telemetry: Personally reviewed.  Rhythm is sinus tachycardia with heart rate of 141 bpm.  No dynamic ST segment changes appreciated.   Severity of Illness:  The appropriate patient status for this patient is INPATIENT. Inpatient status is judged to be reasonable and necessary in order to provide the required intensity of service to ensure the patient's safety. The patient's presenting symptoms, physical exam findings, and initial radiographic and laboratory data in the context of their chronic comorbidities is felt to place them at high risk for further clinical deterioration. Furthermore, it is not anticipated that the patient will be medically stable for discharge from the hospital within 2 midnights of admission.   * I certify that at the point of admission it is my clinical judgment that the patient will require inpatient hospital care spanning beyond 2 midnights from the point of admission due to high intensity of service, high risk for further deterioration and high frequency of surveillance required.*  Time spent:  56 minutes  Author:  Vernelle Emerald MD  06/27/2022 9:51 PM

## 2022-06-27 NOTE — Hospital Course (Addendum)
52 y.o. female with medical history significant for cardiac arrest [id number], cyclical vomiting syndrome, obesity, hypertension, diabetes mellitus, chronic low back pain, degenerative disc disease, anxiety disorder who presents to the emergency room for evaluation of several falls over the weekend.  Upon arrival to the ER she was hypotensive with systolic blood pressure of 82 mmHg.  Patient was also found to have multiple SIRS criteria and urinalysis suggestive of urinary tract infection all concerning for sepsis.  Imaging showed  New mild superior endplate compression deformity at L4, favored to be acute.  Orthopedic surgery was consulted in the ER and they recommended LSO brace.  Patient was admitted to the hospitalist service.  Patient was treated with intravenous ceftriaxone for sepsis secondary to urinary tract infection.  Urine cultures ended up growing out Klebsiella.  Patient's concurrent lactic acidosis was managed with aggressive intravenous volume resuscitation.  Concerning patient's compression fracture, patient was initially evaluated by interventional radiology however they opted to postpone a kyphoplasty in the setting of sepsis.

## 2022-06-27 NOTE — Plan of Care (Signed)
  Problem: Coping: Goal: Level of anxiety will decrease Outcome: Progressing   Problem: Pain Managment: Goal: General experience of comfort will improve Outcome: Progressing   

## 2022-06-27 NOTE — Plan of Care (Signed)
  Problem: Education: Goal: Knowledge of General Education information will improve Description: Including pain rating scale, medication(s)/side effects and non-pharmacologic comfort measures Outcome: Progressing   Problem: Activity: Goal: Risk for activity intolerance will decrease Outcome: Progressing   Problem: Pain Managment: Goal: General experience of comfort will improve Outcome: Progressing   

## 2022-06-27 NOTE — Assessment & Plan Note (Addendum)
Consistent with sinus tachycardia on telemetry and EKGs Review of previous hospitalizations reveals that this has been ongoing for approximately the past year Recent TSH unremarkable Thought initially to be secondary to sepsis, pain and volume depletion but seemingly persisting suggestive of inappropriate sinus tachycardia Will transition patient off of telemetry We will transition patient from metoprolol to tartrate to metoprolol succinate Echocardiogram revealing preserved ejection fraction, normal troponin

## 2022-06-27 NOTE — Progress Notes (Signed)
Patient ID: [name], female   DOB: [birth year], 52 y.o.   MRN: [id number] Asked by [facility] to determine if pt would be suitable candidate for kyphoplasty secondary to symptomatic acute L4 comp fracture/recent fall. Latest MRI was reviewed by Dr. Debbrah Alar and based on imaging pt would be candidate for L4 KP. She is currently asleep. She is also undergoing treatment for recent klebsiella UTI (on rocephin) and has rising WBC , currently 19.9(12.5) with absolute neuts of 18.1. Afebrile, remains tachycardic at 127.  Latest blood cx pend. KP contraindicated during active infection so will await completion of treatment and neg f/u final cultures before pursuing discussion with pt about procedure.

## 2022-06-27 NOTE — Assessment & Plan Note (Addendum)
Lactic acidosis likely secondary to sepsis with concurrent volume depletion Improved with intravenous volume resuscitation

## 2022-06-27 NOTE — Progress Notes (Signed)
PT Cancellation Note  Patient Details Name: [name] MRN: [id number] DOB: 22-Jun-1970   Cancelled Treatment:    Reason Eval/Treat Not Completed: Patient not medically ready PT orders received, chart reviewed. Pt received in bed, asleep. HR noted to be 145 bpm & exertional activity contraindicated at this time. Will f/u as able. Nurse made aware of pt's elevated HR.  Lavone Nian, PT, DPT 06/27/22, 10:19 AM   Waunita Schooner 06/27/2022, 10:17 AM

## 2022-06-27 NOTE — Assessment & Plan Note (Signed)
Continuing home regimen of lipid lowering therapy.  

## 2022-06-27 NOTE — Assessment & Plan Note (Addendum)
Multiple hospitalizations for intractable nausea and vomiting in the past Patient has undergone extensive workup including multiple endoscopic evaluations.  Patient follows with [facility]. Last EGD 09/2021 revealing small hiatal hernia and patchy moderate inflammation characterized by edema and erythema in the gastric antrum. Suspected history of cyclical vomiting syndrome in the past Symptoms possibly exacerbated by underlying infection on this presentation Symptoms do seem to be improving today. CT imaging reveals no evidence of small bowel obstruction Treating with as needed antiemetics

## 2022-06-28 ENCOUNTER — Inpatient Hospital Stay (HOSPITAL_COMMUNITY)
Admit: 2022-06-28 | Discharge: 2022-06-28 | Disposition: A | Discharge status: Home / Self Care | Payer: Medicare Other | Attending: Internal Medicine | Admitting: Internal Medicine

## 2022-06-28 DIAGNOSIS — R000 Tachycardia, unspecified: Secondary | ICD-10-CM | POA: Diagnosis not present

## 2022-06-28 DIAGNOSIS — S32040A Wedge compression fracture of fourth lumbar vertebra, initial encounter for closed fracture: Secondary | ICD-10-CM | POA: Diagnosis not present

## 2022-06-28 DIAGNOSIS — A419 Sepsis, unspecified organism: Secondary | ICD-10-CM | POA: Diagnosis not present

## 2022-06-28 DIAGNOSIS — R4182 Altered mental status, unspecified: Secondary | ICD-10-CM | POA: Diagnosis not present

## 2022-06-28 DIAGNOSIS — E872 Acidosis, unspecified: Secondary | ICD-10-CM | POA: Diagnosis not present

## 2022-06-28 LAB — COMPREHENSIVE METABOLIC PANEL
ALT: 26 U/L (ref 0–44)
AST: 42 U/L — ABNORMAL HIGH (ref 15–41)
Albumin: 2.9 g/dL — ABNORMAL LOW (ref 3.5–5.0)
Alkaline Phosphatase: 73 U/L (ref 38–126)
Anion gap: 12 (ref 5–15)
BUN: <5 mg/dL — ABNORMAL LOW (ref 6–20)
CO2: 27 mmol/L (ref 22–32)
Calcium: 8.6 mg/dL — ABNORMAL LOW (ref 8.9–10.3)
Chloride: 106 mmol/L (ref 98–111)
Creatinine, Ser: 0.71 mg/dL (ref 0.44–1.00)
GFR, Estimated: >60 mL/min (ref >60)
Glucose, Bld: 88 mg/dL (ref 70–99)
Potassium: 3.7 mmol/L (ref 3.5–5.1)
Sodium: 145 mmol/L (ref 135–145)
Total Bilirubin: 0.5 mg/dL (ref 0.3–1.2)
Total Protein: 6.3 g/dL — ABNORMAL LOW (ref 6.5–8.1)

## 2022-06-28 LAB — CBC WITH DIFFERENTIAL/PLATELET
Abs Immature Granulocytes: 0.34 10*3/uL — ABNORMAL HIGH (ref 0.00–0.07)
Basophils Absolute: 0.1 10*3/uL (ref 0.0–0.1)
Basophils Relative: 1 %
Eosinophils Absolute: 0.1 10*3/uL (ref 0.0–0.5)
Eosinophils Relative: 1 %
HCT: 40.5 % (ref 36.0–46.0)
Hemoglobin: 13.1 g/dL (ref 12.0–15.0)
Immature Granulocytes: 3 %
Lymphocytes Relative: 17 %
Lymphs Abs: 2.1 10*3/uL (ref 0.7–4.0)
MCH: 30 pg (ref 26.0–34.0)
MCHC: 32.3 g/dL (ref 30.0–36.0)
MCV: 92.9 fL (ref 80.0–100.0)
Monocytes Absolute: 1.3 10*3/uL — ABNORMAL HIGH (ref 0.1–1.0)
Monocytes Relative: 11 %
Neutro Abs: 8.5 10*3/uL — ABNORMAL HIGH (ref 1.7–7.7)
Neutrophils Relative %: 67 %
Platelets: 259 10*3/uL (ref 150–400)
RBC: 4.36 MIL/uL (ref 3.87–5.11)
RDW: 15 % (ref 11.5–15.5)
WBC: 12.3 10*3/uL — ABNORMAL HIGH (ref 4.0–10.5)
nRBC: 0.3 % — ABNORMAL HIGH (ref 0.0–0.2)

## 2022-06-28 LAB — LACTIC ACID, PLASMA: Lactic Acid, Venous: 2.1 mmol/L (ref 0.5–1.9)

## 2022-06-28 LAB — URINE CULTURE: Culture: >=100000 — AB

## 2022-06-28 LAB — PROCALCITONIN: Procalcitonin: 0.12 ng/mL

## 2022-06-28 LAB — GLUCOSE, CAPILLARY
Glucose-Capillary: 91 mg/dL (ref 70–99)
Glucose-Capillary: 91 mg/dL (ref 70–99)
Glucose-Capillary: 117 mg/dL — ABNORMAL HIGH (ref 70–99)

## 2022-06-28 LAB — MAGNESIUM: Magnesium: 1.6 mg/dL — ABNORMAL LOW (ref 1.7–2.4)

## 2022-06-28 LAB — C-REACTIVE PROTEIN: CRP: 1.8 mg/dL — ABNORMAL HIGH (ref <1.0)

## 2022-06-28 MED ORDER — LACTATED RINGERS IV SOLN: INTRAVENOUS | Status: AC

## 2022-06-28 MED ORDER — ENOXAPARIN SODIUM 40 MG/0.4ML IJ SOSY
40.0000 mg | PREFILLED_SYRINGE | INTRAMUSCULAR | Status: DC
  Administered 2022-06-28 – 2022-07-01 (×4): 40 mg via SUBCUTANEOUS
  Filled 2022-06-28 (×4): qty 0.4

## 2022-06-28 MED ORDER — KETOROLAC TROMETHAMINE 15 MG/ML IJ SOLN
15.0000 mg | Freq: Four times a day (QID) | INTRAMUSCULAR | Status: DC | PRN
  Administered 2022-06-28 – 2022-06-29 (×3): 15 mg via INTRAVENOUS
  Filled 2022-06-28 (×4): qty 1

## 2022-06-28 MED ORDER — CHLORHEXIDINE GLUCONATE CLOTH 2 % EX PADS
6.0000 | MEDICATED_PAD | Freq: Every day | CUTANEOUS | Status: DC
  Administered 2022-06-28 – 2022-06-30 (×3): 6 via TOPICAL

## 2022-06-28 MED ORDER — SODIUM CHLORIDE 0.9% FLUSH: 10.0000 mL | INTRAVENOUS | Status: DC | PRN

## 2022-06-28 MED ORDER — SODIUM CHLORIDE 0.9% FLUSH
10.0000 mL | Freq: Two times a day (BID) | INTRAVENOUS | Status: DC
  Administered 2022-06-28 (×2): 10 mL

## 2022-06-28 MED ORDER — LIDOCAINE 5 % EX PTCH
2.0000 | MEDICATED_PATCH | CUTANEOUS | Status: DC
  Administered 2022-06-28 – 2022-06-30 (×3): 2 via TRANSDERMAL
  Filled 2022-06-28 (×4): qty 2

## 2022-06-28 NOTE — Progress Notes (Signed)
PROGRESS NOTE   [name]  [id number] DOB: 02/24/1971 DOA: 06/25/2022 PCP: Ferd Hibbs, NP   Date of Service: the patient was seen and examined on 06/28/2022  Brief Narrative:  52 y.o. female with medical history significant for obesity, hypertension, diabetes mellitus, chronic low back pain, degenerative disc disease, anxiety disorder who presents to the emergency room for evaluation of several falls over the weekend.  Upon arrival to the ER she was hypotensive with systolic blood pressure of 82 mmHg.  Patient was also found to have multiple SIRS criteria and urinalysis suggestive of urinary tract infection all concerning for sepsis.  Imaging showed  New mild superior endplate compression deformity at L4, favored to be acute.  Orthopedic surgery was consulted in the ER and they recommended LSO brace.  Patient was admitted to the hospitalist service.  Patient was treated with intravenous ceftriaxone for sepsis secondary to urinary tract infection.  Urine cultures ended up growing out Klebsiella.  Patient's concurrent lactic acidosis was managed with aggressive intravenous volume resuscitation.  Concerning patient's compression fracture, patient was initially evaluated by interventional radiology however they opted to postpone a kyphoplasty in the setting of sepsis.     Assessment and Plan: * Sepsis secondary to UTI ([place]) Klebsiella identified on urine culture 3/4, sensitivities reveal sensitivity to ceftriaxone Leukocytosis and tachycardia slowly improving Continuing to treat with intravenous ceftriaxone as oral intake is currently tenuous Continue intravenous volume resuscitation considering patient has a persisting lactic acidosis Blood cultures remain without growth CT imaging on 3/4 did not reveal a definitive focus of infection.  Closed compression fracture of L4 lumbar vertebra, initial encounter ([place]) Imaging on admission shows new mild superior endplate compression  deformity at L4 Continued complaints of severe low back pain worse than her baseline  IR evaluated patient for possible kyphoplasty on 3/6 but is currently not proceeding until after active infection is treated Placing Lidoderm patches Adding as needed intravenous Toradol Continue as needed opiate-based analgesics PT/OT evaluation Orthopedic surgery consult has been requested and they recommended an LSO brace  Sinus tachycardia Consistent with sinus tachycardia on telemetry and EKGs Recent TSH unremarkable Thought to be secondary to sepsis pain and volume depletion Monitoring on telemetry, treating underlying illness with antibiotics, treating pain, hydrating with intravenous fluids  Lactic acidosis Lactic acidosis likely secondary to sepsis with concurrent volume depletion Repeat lactic acid today reveals persisting mild lactic acidosis Hydrate patient intravenous isotonic fluids while concurrently treating underlying infection Monitoring serial lactic acid levels to ensure downtrending and resolution.   Intractable nausea and vomiting Multiple hospitalizations for intractable nausea and vomiting in the past Patient has undergone extensive workup including multiple endoscopic evaluations.  Patient follows with [facility]. Last EGD 09/2021 revealing small hiatal hernia and patchy moderate inflammation characterized by edema and erythema in the gastric antrum. Suspected history of cyclical vomiting syndrome in the past Symptoms possibly exacerbated by underlying infection on this presentation CT imaging reveals no evidence of small bowel obstruction Treating with as needed antiemetics   Fall at home, initial encounter Thought to be secondary to acute illness with hypotension and lightheadedness Treating underlying infection, hydrating patient with intravenous isotonic fluids Fall precautions PT/OT evaluation   Type 2 diabetes mellitus without complication, without  long-term current use of insulin (HCC) Hemoglobin A1c 5.3 on 1/30  Diabetic diet as tolerated  Accu-Cheks before every meal and at bedtime with sliding scale insulin  Holding oral hypoglycemics  Hypothyroidism Continue Synthroid  Hypomagnesemia Replaced  Essential hypertension As needed  intravenous antihypertensives for markedly elevated blood pressure   Hypokalemia Replaced   Mixed diabetic hyperlipidemia associated with type 2 diabetes mellitus ([place]) Continuing home regimen of lipid lowering therapy.   Depression with anxiety Continue trazodone, Abilify and fluoxetine     Subjective:  Patient complaining of nausea, generalized abdominal pain, moderate intensity, sharp in quality.  Patient also complaining of ongoing severe low back pain, 10 out of 10 in intensity, sharp in quality, worse with movement.  Physical Exam:  Vitals:   06/28/22 1430 06/28/22 1640 06/28/22 1744 06/28/22 1900  BP: (!) 151/75 (!) 142/81 (!) 140/92 (!) 140/97  Pulse: (!) 127 (!) 124 (!) 116   Resp: (!) 21 16 (!) 21   Temp: 98.4 F (36.9 C)   98.5 F (36.9 C)  TempSrc: Oral   Oral  SpO2: 97% 95% 100%   Weight:      Height:        Constitutional: Lethargic but arousable, oriented x 3, patient is in distress due to back pain. Skin: no rashes, no lesions, good skin turgor noted. Eyes: Pupils are equally reactive to light.  No evidence of scleral icterus or conjunctival pallor.  ENMT: Moist mucous membranes noted.  Posterior pharynx clear of any exudate or lesions.   Respiratory: clear to auscultation bilaterally, no wheezing, no crackles. Normal respiratory effort. No accessory muscle use.  Cardiovascular: Tachycardic rate with regular rhythm.  No murmurs / rubs / gallops. No extremity edema. 2+ pedal pulses. No carotid bruits.  Abdomen: Notable tenderness.  Abdomen is soft.  No evidence of intra-abdominal masses.  Positive bowel sounds noted in all quadrants.   Musculoskeletal: No joint  deformity upper and lower extremities. Good ROM, no contractures. Normal muscle tone.    Data Reviewed:  I have personally reviewed and interpreted labs, imaging.  Significant findings are   CBC: Recent Labs  Lab 06/25/22 1038 06/25/22 1104 06/26/22 0651 06/27/22 0354 06/28/22 0326  WBC 11.1*  --  12.5* 19.9* 12.3*  NEUTROABS 7.3  --   --  18.1* 8.5*  HGB 15.1* 15.6* 12.9 12.4 13.1  HCT 45.7 46.0 39.8 36.4 40.5  MCV 92.0  --  93.2 90.3 92.9  PLT 377  --  307 377 Q000111Q   Basic Metabolic Panel: Recent Labs  Lab 06/25/22 1038 06/25/22 1104 06/26/22 0651 06/27/22 0354 06/28/22 0326  NA 138 140 139 142 145  K 3.0* 3.2* 3.9 3.3* 3.7  CL 104 105 108 111 106  CO2 18*  --  20* 19* 27  GLUCOSE 137* 135* 107* 166* 88  BUN 9 6 <5* <5* <5*  CREATININE 1.09* 1.00 0.65 0.81 0.71  CALCIUM 8.8*  --  8.3* 8.3* 8.6*  MG 1.3*  --   --   --  1.6*   GFR: Estimated Creatinine Clearance: 87.2 mL/min (by C-G formula based on SCr of 0.71 mg/dL). Liver Function Tests: Recent Labs  Lab 06/25/22 1038 06/27/22 0354 06/28/22 0326  AST 49* 47* 42*  ALT '31 24 26  '$ ALKPHOS 119 83 73  BILITOT 0.7 0.6 0.5  PROT 7.5 6.3* 6.3*  ALBUMIN 3.1* 2.6* 2.9*    Coagulation Profile: Recent Labs  Lab 06/25/22 1556 06/26/22 0651  INR 1.1 1.0     EKG/Telemetry: Personally reviewed.  Rhythm is sinus tachycardia with heart rate of 120 bpm.    Code Status:  Full code.    Severity of Illness:  The appropriate patient status for this patient is INPATIENT. Inpatient status is judged to  be reasonable and necessary in order to provide the required intensity of service to ensure the patient's safety. The patient's presenting symptoms, physical exam findings, and initial radiographic and laboratory data in the context of their chronic comorbidities is felt to place them at high risk for further clinical deterioration. Furthermore, it is not anticipated that the patient will be medically stable for  discharge from the hospital within 2 midnights of admission.   * I certify that at the point of admission it is my clinical judgment that the patient will require inpatient hospital care spanning beyond 2 midnights from the point of admission due to high intensity of service, high risk for further deterioration and high frequency of surveillance required.*  Time spent:  56 minutes  Author:  Vernelle Emerald MD  06/28/2022 9:04 PM

## 2022-06-28 NOTE — Progress Notes (Signed)
PT Cancellation Note  Patient Details Name: [name] MRN: [id number] DOB: February 19, 1971   Cancelled Treatment:    Reason Eval/Treat Not Completed: Medical issues which prohibited therapy (per RN pt is lethargic, clammy, having twitching on R face and UE, elevated HR/BP, not ready for PT at present. Will follow.)   Philomena Doheny PT 06/28/2022  Acute Rehabilitation Services  Office [phone]

## 2022-06-28 NOTE — Care Management Important Message (Signed)
Important Message  Patient Details IM Letter given. Name: [name] MRN: [id number] Date of Birth: 1971/03/13   Medicare Important Message Given:  Yes     Kerin Salen 06/28/2022, 2:55 PM

## 2022-06-28 NOTE — Progress Notes (Signed)
0832- Entered room and observed patient lethargic/obtunded. Attempted to arouse with voice and touch and patient unarouseable. Twitching noted to right side of face and right arm. Forehead is clammy. Extremities are hot and sweaty. Pale with flushed appearance.  Sternal rub done and continued loud speech. Patient arouses and is confused and irritable. Tremoring to bilateral arms. Teeth are chattering/rigors. Patient c/o nausea.  0834 Notified charge nurse/director and rapid response called for assessment. Vital signs and blood sugar checks done; see flowsheets. Elevated blood pressure and heart rate.  Dr. Cyd Silence notified of above findings. See new orders.   Coordination with admin, lab, and IV team. Lab reports that only a pediatric tube could be obtained this am. Dr. Cyd Silence updated and is okay with waiting to obtain more labs until PICC line placed. IV team confirms that patient is next on schedule for PICC line placement.  Patient transferring to room 1415 for closer monitoring, and additional testing/treatments.  Ivan Anchors, RN 06/28/22 9:28 AM

## 2022-06-28 NOTE — Procedures (Signed)
Patient Name: [name]  MRN: [id number]  Epilepsy Attending: Lora Havens  Referring Physician/Provider: Vernelle Emerald, MD  Date: 06/28/2022 Duration: 23.06 mins  Patient history: 52yo F with ams. EEG to evaluate for seizure.  Level of alertness: Awake  AEDs during EEG study: None  Technical aspects: This EEG study was done with scalp electrodes positioned according to the 10-20 International system of electrode placement. Electrical activity was reviewed with band pass filter of 1-'70Hz'$ , sensitivity of 7 uV/mm, display speed of 31m/sec with a '60Hz'$  notched filter applied as appropriate. EEG data were recorded continuously and digitally stored.  Video monitoring was available and reviewed as appropriate.  Description: The posterior dominant rhythm consists of 8 Hz activity of moderate voltage (25-35 uV) seen predominantly in posterior head regions, symmetric and reactive to eye opening and eye closing. Hyperventilation and photic stimulation were not performed.     IMPRESSION: This study is within normal limits. No seizures or epileptiform discharges were seen throughout the recording.  Jodie Leiner OBarbra Sarks

## 2022-06-28 NOTE — Progress Notes (Signed)
Peripherally Inserted Central Catheter Placement  The IV Nurse has discussed with the patient and/or persons authorized to consent for the patient, the purpose of this procedure and the potential benefits and risks involved with this procedure.  The benefits include less needle sticks, lab draws from the catheter, and the patient may be discharged home with the catheter. Risks include, but not limited to, infection, bleeding, blood clot (thrombus formation), and puncture of an artery; nerve damage and irregular heartbeat and possibility to perform a PICC exchange if needed/ordered by physician.  Alternatives to this procedure were also discussed.  Bard Power PICC patient education guide, fact sheet on infection prevention and patient information card has been provided to patient /or left at bedside.    PICC Placement Documentation  PICC Double Lumen 06/28/22 Right Brachial 36 cm 0 cm (Active)  Indication for Insertion or Continuance of Line Limited venous access - need for IV therapy >5 days (PICC only) 06/28/22 1000  Exposed Catheter (cm) 0 cm 06/28/22 1000  Site Assessment Clean, Dry, Intact 06/28/22 1000  Lumen #1 Status Flushed;Saline locked;Blood return noted 06/28/22 1000  Lumen #2 Status Flushed;Saline locked;Blood return noted 06/28/22 1000  Dressing Type Transparent;Securing device 06/28/22 1000  Dressing Status Antimicrobial disc in place;Clean, Dry, Intact 06/28/22 1000  Line Care Connections checked and tightened 06/28/22 1000  Dressing Intervention New dressing 06/28/22 1000  Dressing Change Due 07/05/22 06/28/22 1000       Holley Bouche Renee 06/28/2022, 10:42 AM

## 2022-06-28 NOTE — Progress Notes (Signed)
EEG complete - results pending 

## 2022-06-29 DIAGNOSIS — I4711 Inappropriate sinus tachycardia, so stated: Secondary | ICD-10-CM

## 2022-06-29 LAB — COMPREHENSIVE METABOLIC PANEL
ALT: 34 U/L (ref 0–44)
AST: 65 U/L — ABNORMAL HIGH (ref 15–41)
Albumin: 2.5 g/dL — ABNORMAL LOW (ref 3.5–5.0)
Alkaline Phosphatase: 71 U/L (ref 38–126)
Anion gap: 11 (ref 5–15)
BUN: <5 mg/dL — ABNORMAL LOW (ref 6–20)
CO2: 26 mmol/L (ref 22–32)
Calcium: 8.3 mg/dL — ABNORMAL LOW (ref 8.9–10.3)
Chloride: 103 mmol/L (ref 98–111)
Creatinine, Ser: 0.67 mg/dL (ref 0.44–1.00)
GFR, Estimated: >60 mL/min (ref >60)
Glucose, Bld: 85 mg/dL (ref 70–99)
Potassium: 3 mmol/L — ABNORMAL LOW (ref 3.5–5.1)
Sodium: 140 mmol/L (ref 135–145)
Total Bilirubin: 0.6 mg/dL (ref 0.3–1.2)
Total Protein: 5.7 g/dL — ABNORMAL LOW (ref 6.5–8.1)

## 2022-06-29 LAB — CBC WITH DIFFERENTIAL/PLATELET
Abs Immature Granulocytes: 0.22 10*3/uL — ABNORMAL HIGH (ref 0.00–0.07)
Basophils Absolute: 0.1 10*3/uL (ref 0.0–0.1)
Basophils Relative: 1 %
Eosinophils Absolute: 0.3 10*3/uL (ref 0.0–0.5)
Eosinophils Relative: 3 %
HCT: 33.5 % — ABNORMAL LOW (ref 36.0–46.0)
Hemoglobin: 10.8 g/dL — ABNORMAL LOW (ref 12.0–15.0)
Immature Granulocytes: 2 %
Lymphocytes Relative: 24 %
Lymphs Abs: 2.5 10*3/uL (ref 0.7–4.0)
MCH: 30.5 pg (ref 26.0–34.0)
MCHC: 32.2 g/dL (ref 30.0–36.0)
MCV: 94.6 fL (ref 80.0–100.0)
Monocytes Absolute: 0.9 10*3/uL (ref 0.1–1.0)
Monocytes Relative: 8 %
Neutro Abs: 6.6 10*3/uL (ref 1.7–7.7)
Neutrophils Relative %: 62 %
Platelets: 230 10*3/uL (ref 150–400)
RBC: 3.54 MIL/uL — ABNORMAL LOW (ref 3.87–5.11)
RDW: 15 % (ref 11.5–15.5)
WBC: 10.6 10*3/uL — ABNORMAL HIGH (ref 4.0–10.5)
nRBC: 0.4 % — ABNORMAL HIGH (ref 0.0–0.2)

## 2022-06-29 LAB — GLUCOSE, CAPILLARY
Glucose-Capillary: 101 mg/dL — ABNORMAL HIGH (ref 70–99)
Glucose-Capillary: 123 mg/dL — ABNORMAL HIGH (ref 70–99)
Glucose-Capillary: 106 mg/dL — ABNORMAL HIGH (ref 70–99)

## 2022-06-29 LAB — LACTIC ACID, PLASMA: Lactic Acid, Venous: 1.5 mmol/L (ref 0.5–1.9)

## 2022-06-29 LAB — MAGNESIUM: Magnesium: 1.7 mg/dL (ref 1.7–2.4)

## 2022-06-29 MED ORDER — POTASSIUM CHLORIDE 10 MEQ/100ML IV SOLN
10.0000 meq | INTRAVENOUS | Status: AC
  Administered 2022-06-29 – 2022-06-30 (×6): 10 meq via INTRAVENOUS
  Filled 2022-06-29 (×6): qty 100

## 2022-06-29 MED ORDER — METOPROLOL TARTRATE 50 MG PO TABS
50.0000 mg | ORAL_TABLET | Freq: Two times a day (BID) | ORAL | Status: DC
  Administered 2022-06-29 – 2022-06-30 (×3): 50 mg via ORAL
  Filled 2022-06-29 (×3): qty 1

## 2022-06-29 MED ORDER — METOPROLOL TARTRATE 25 MG PO TABS
25.0000 mg | ORAL_TABLET | Freq: Two times a day (BID) | ORAL | Status: DC
  Administered 2022-06-29: 25 mg via ORAL
  Filled 2022-06-29: qty 1

## 2022-06-29 NOTE — Plan of Care (Signed)
  Problem: Education: Goal: Knowledge of General Education information will improve Description: Including pain rating scale, medication(s)/side effects and non-pharmacologic comfort measures Outcome: Progressing   Problem: Clinical Measurements: Goal: Diagnostic test results will improve Outcome: Progressing Goal: Cardiovascular complication will be avoided Outcome: Progressing   Problem: Activity: Goal: Risk for activity intolerance will decrease Outcome: Progressing   Problem: Safety: Goal: Ability to remain free from injury will improve Outcome: Progressing   Problem: Pain Managment: Goal: General experience of comfort will improve Outcome: Not Progressing

## 2022-06-29 NOTE — Progress Notes (Addendum)
PROGRESS NOTE   [name]  [id number] DOB: 20-Dec-1970 DOA: 06/25/2022 PCP: Ferd Hibbs, NP   Date of Service: the patient was seen and examined on 06/29/2022  Brief Narrative:  52 y.o. female with medical history significant for cardiac arrest [id number], cyclical vomiting syndrome, obesity, hypertension, diabetes mellitus, chronic low back pain, degenerative disc disease, anxiety disorder who presents to the emergency room for evaluation of several falls over the weekend.  Upon arrival to the ER she was hypotensive with systolic blood pressure of 82 mmHg.  Patient was also found to have multiple SIRS criteria and urinalysis suggestive of urinary tract infection all concerning for sepsis.  Imaging showed  New mild superior endplate compression deformity at L4, favored to be acute.  Orthopedic surgery was consulted in the ER and they recommended LSO brace.  Patient was admitted to the hospitalist service.  Patient was treated with intravenous ceftriaxone for sepsis secondary to urinary tract infection.  Urine cultures ended up growing out Klebsiella.  Patient's concurrent lactic acidosis was managed with aggressive intravenous volume resuscitation.  Concerning patient's compression fracture, patient was initially evaluated by interventional radiology however they opted to postpone a kyphoplasty in the setting of sepsis.     Assessment and Plan: * Sepsis secondary to UTI ([place]) Klebsiella identified on urine culture 3/4, sensitivities reveal sensitivity to ceftriaxone Leukocytosis is improving.  Tachycardia persisting although this is more likely secondary to inappropriate sinus tachycardia. Lactic acidosis resolved. Continuing to treat with intravenous ceftriaxone as oral intake is currently tenuous Attempting to transition patient off of intravenous fluids today. Blood cultures remain without growth CT imaging on 3/4 did not reveal a definitive focus of infection.  Closed  compression fracture of L4 lumbar vertebra, initial encounter [facility]) Imaging on admission shows new mild superior endplate compression deformity at L4 Continued complaints of severe low back pain although this is somewhat improved compared to yesterday.   IR evaluated patient for possible kyphoplasty on 3/6 but is currently not proceeding until after active infection is treated Placing Lidoderm patches which seemingly has improved the patient's pain. Continue as needed intravenous Toradol Continue as needed opiate-based analgesics PT/OT evaluation -recommending home health physical therapy Orthopedic surgery consult has been requested and they recommended an LSO brace  Inappropriate sinus tachycardia Consistent with sinus tachycardia on telemetry and EKGs Review of previous hospitalizations reveals that this has been ongoing for approximately the past year Recent TSH unremarkable Thought initially to be secondary to sepsis, pain and volume depletion but seemingly persisting suggestive of inappropriate sinus tachycardia Monitoring on telemetry, treating underlying illness with antibiotics, treating pain, hydrating with intravenous fluids Working up further with troponin and echocardiogram Initiating metoprolol twice daily  Lactic acidosis Lactic acidosis likely secondary to sepsis with concurrent volume depletion Improved with intravenous volume resuscitation   Intractable nausea and vomiting Multiple hospitalizations for intractable nausea and vomiting in the past Patient has undergone extensive workup including multiple endoscopic evaluations.  Patient follows with [facility]. Last EGD 09/2021 revealing small hiatal hernia and patchy moderate inflammation characterized by edema and erythema in the gastric antrum. Suspected history of cyclical vomiting syndrome in the past Symptoms possibly exacerbated by underlying infection on this presentation CT imaging reveals no  evidence of small bowel obstruction Treating with as needed antiemetics   Fall at home, initial encounter Thought to be secondary to acute illness with hypotension and lightheadedness Treating underlying infection, hydrating patient with intravenous isotonic fluids Fall precautions PT/OT evaluation performed-recommending home health physical therapy.  Type 2 diabetes mellitus without complication, without long-term current use of insulin (HCC) Hemoglobin A1c 5.3 on 1/30  Diabetic diet as tolerated  Accu-Cheks before every meal and at bedtime with sliding scale insulin  Holding oral hypoglycemics  Hypothyroidism Continue Synthroid  Hypomagnesemia Replaced  Essential hypertension As needed intravenous antihypertensives for markedly elevated blood pressure   Hypokalemia Recurrent Replacing with potassium chloride Evaluating for concurrent hypomagnesemia  Monitoring potassium levels with serial chemistries.    Mixed diabetic hyperlipidemia associated with type 2 diabetes mellitus ([place]) Continuing home regimen of lipid lowering therapy.   Depression with anxiety Continue trazodone, Abilify and fluoxetine     Subjective:  Patient states that her abdominal pain is improving however she is still experiencing associated nausea and several bouts of vomiting today.  Patient states that her back pain, while still present is improved since yesterday.  Back pain is in the lumbar spine, sharp in quality, worse with movement.  Patient denies any chest pain or shortness of breath.  Physical Exam:  Vitals:   06/29/22 0742 06/29/22 1223 06/29/22 1225 06/29/22 2047  BP: (!) 148/80 (!) 157/99    Pulse: (!) 133 (!) 128 (!) 125   Resp: 19 (!) 26 18   Temp: (!) 97.5 F (36.4 C) 97.6 F (36.4 C)  98.3 F (36.8 C)  TempSrc: Oral Oral  Oral  SpO2:  100% 100%   Weight:      Height:        Constitutional: Awake alert and oriented x3, no associated distress.   Skin: no rashes, no  lesions, good skin turgor noted. Eyes: Pupils are equally reactive to light.  No evidence of scleral icterus or conjunctival pallor.  ENMT: Moist mucous membranes noted.  Posterior pharynx clear of any exudate or lesions.   Respiratory: clear to auscultation bilaterally, no wheezing, no crackles. Normal respiratory effort. No accessory muscle use.  Cardiovascular: Tachycardic rate with regular rhythm no murmurs / rubs / gallops. No extremity edema. 2+ pedal pulses. No carotid bruits.  Abdomen: Abdomen is diffusely tender but soft.  No evidence of intra-abdominal masses.  Positive bowel sounds noted in all quadrants.   Musculoskeletal: No joint deformity upper and lower extremities. Good ROM, no contractures. Normal muscle tone.    Data Reviewed:  I have personally reviewed and interpreted labs, imaging.  Significant findings are   CBC: Recent Labs  Lab 06/25/22 1038 06/25/22 1104 06/26/22 0651 06/27/22 0354 06/28/22 0326 06/29/22 0251  WBC 11.1*  --  12.5* 19.9* 12.3* 10.6*  NEUTROABS 7.3  --   --  18.1* 8.5* 6.6  HGB 15.1* 15.6* 12.9 12.4 13.1 10.8*  HCT 45.7 46.0 39.8 36.4 40.5 33.5*  MCV 92.0  --  93.2 90.3 92.9 94.6  PLT 377  --  307 377 259 [id number]   Basic Metabolic Panel: Recent Labs  Lab 06/25/22 1038 06/25/22 1104 06/26/22 0651 06/27/22 0354 06/28/22 0326 06/29/22 0251  NA 138 140 139 142 145 140  K 3.0* 3.2* 3.9 3.3* 3.7 3.0*  CL 104 105 108 111 106 103  CO2 18*  --  20* 19* 27 26  GLUCOSE 137* 135* 107* 166* 88 85  BUN 9 6 <5* <5* <5* <5*  CREATININE 1.09* 1.00 0.65 0.81 0.71 0.67  CALCIUM 8.8*  --  8.3* 8.3* 8.6* 8.3*  MG 1.3*  --   --   --  1.6* 1.7   GFR: Estimated Creatinine Clearance: 87.2 mL/min (by C-G formula based on SCr of 0.67 mg/dL).  Liver Function Tests: Recent Labs  Lab 06/25/22 1038 06/27/22 0354 06/28/22 0326 06/29/22 0251  AST 49* 47* 42* 65*  ALT '31 24 26 '$ 34  ALKPHOS 119 83 73 71  BILITOT 0.7 0.6 0.5 0.6  PROT 7.5 6.3* 6.3* 5.7*   ALBUMIN 3.1* 2.6* 2.9* 2.5*    Coagulation Profile: Recent Labs  Lab 06/25/22 1556 06/26/22 0651  INR 1.1 1.0     EKG/Telemetry: Personally reviewed.  Rhythm is sinus tachycardia with heart rate of 118 bpm.  No dynamic ST segment changes appreciated.   Code Status:  Full code.  Code status decision has been confirmed with: patient    Severity of Illness:  The appropriate patient status for this patient is INPATIENT. Inpatient status is judged to be reasonable and necessary in order to provide the required intensity of service to ensure the patient's safety. The patient's presenting symptoms, physical exam findings, and initial radiographic and laboratory data in the context of their chronic comorbidities is felt to place them at high risk for further clinical deterioration. Furthermore, it is not anticipated that the patient will be medically stable for discharge from the hospital within 2 midnights of admission.   * I certify that at the point of admission it is my clinical judgment that the patient will require inpatient hospital care spanning beyond 2 midnights from the point of admission due to high intensity of service, high risk for further deterioration and high frequency of surveillance required.*  Time spent:  58 minutes  Author:  Vernelle Emerald MD  06/29/2022 9:07 PM

## 2022-06-29 NOTE — Evaluation (Signed)
Physical Therapy Evaluation Patient Details Name: [name] MRN: [id number] DOB: May 19, 1970 Today's Date: 06/29/2022  History of Present Illness  Pt is a 52 y/o F admitted on 06/25/22 after presenting with c/o LBP worsening following a fall at home. Pt was hypotensive & urinalysis was strongly suggestive of UTI. Pt found to have new L4 compression fx. IR has been consulted for consideration of kyphoplasty but awaiting medical stabilty due to sepsis. PMH: obesity, HTN, DM2, chronic LBP, DDD, PTSD with past suicide attempts, MI February 2022, ORIF R ankle, obesity, anxiety disorder  Clinical Impression  Pt admitted with above diagnosis.  Pt currently with functional limitations due to the deficits listed below (see PT Problem List). Pt will benefit from skilled PT to increase their independence and safety with mobility to allow discharge to the venue listed below.  Pt agreeable to mobilize today and able to stand at EOB and march in place briefly.  Pt fatigued quickly and returned to bed.  RR up to 44 and HR up to 147 bpm during activity.  Pt reports she lives with spouse and son and also have DME.  Pt reports mild back pain however medicated for pain prior to session.  Did not use LSO today however did find brace in room and pt agreeable to don next visit as more mobility is anticipated.        Recommendations for follow up therapy are one component of a multi-disciplinary discharge planning process, led by the attending physician.  Recommendations may be updated based on patient status, additional functional criteria and insurance authorization.  Follow Up Recommendations Home health PT      Assistance Recommended at Discharge Intermittent Supervision/Assistance  Patient can return home with the following  A little help with walking and/or transfers;A little help with bathing/dressing/bathroom;Help with stairs or ramp for entrance;Assist for transportation;Assistance with cooking/housework     Equipment Recommendations None recommended by PT  Recommendations for Other Services       Functional Status Assessment Patient has had a recent decline in their functional status and demonstrates the ability to make significant improvements in function in a reasonable and predictable amount of time.     Precautions / Restrictions Precautions Precautions: Fall;Back Precaution Comments: monitor HR Required Braces or Orthoses: Spinal Brace Spinal Brace: Other (comment) Spinal Brace Comments: LSO      Mobility  Bed Mobility Overal bed mobility: Needs Assistance Bed Mobility: Rolling, Sidelying to Sit, Sit to Sidelying Rolling: Min guard Sidelying to sit: Min guard     Sit to sidelying: Min guard General bed mobility comments: assist for line management, pt able to perform physically with cues for technique    Transfers Overall transfer level: Needs assistance Equipment used: Rolling walker (2 wheels) Transfers: Sit to/from Stand Sit to Stand: Min guard           General transfer comment: cues for hand placement, performed marching in place for approx 30 sec twice, RR up to 44 (20 at rest),  SpO2 upper 90s on RA, pt on 2L at rest, 96% on 2L leaving room;  HR 127-147; pt fatigued quickly    Ambulation/Gait                  Stairs            Wheelchair Mobility    Modified Rankin (Stroke Patients Only)       Balance Overall balance assessment: Needs assistance         Standing  balance support: Bilateral upper extremity supported, Reliant on assistive device for balance Standing balance-Leahy Scale: Poor                               Pertinent Vitals/Pain Pain Assessment Pain Assessment: 0-10 Pain Score: 5  Pain Location: back Pain Descriptors / Indicators: Jabbing Pain Intervention(s): Repositioned, Monitored during session, Premedicated before session    Home Living Family/patient expects to be discharged to:: Private  residence Living Arrangements: Spouse/significant other;Children (son) Available Help at Discharge: Family Type of Home: House Home Access: Stairs to enter Entrance Stairs-Rails: Right Entrance Stairs-Number of Steps: 3-4   Home Layout: One level Home Equipment: Conservation officer, nature (2 wheels);Cane - single point;Wheelchair - Brewing technologist      Prior Function Prior Level of Function : Independent/Modified Independent             Mobility Comments: typically not using DME, hx of falls       Hand Dominance        Extremity/Trunk Assessment        Lower Extremity Assessment Lower Extremity Assessment: Generalized weakness       Communication   Communication: No difficulties  Cognition Arousal/Alertness: Awake/alert Behavior During Therapy: WFL for tasks assessed/performed Overall Cognitive Status: Within Functional Limits for tasks assessed                                          General Comments      Exercises     Assessment/Plan    PT Assessment Patient needs continued PT services  PT Problem List Decreased strength;Decreased activity tolerance;Decreased balance;Decreased mobility;Decreased knowledge of use of DME;Cardiopulmonary status limiting activity;Pain       PT Treatment Interventions Gait training;DME instruction;Therapeutic exercise;Functional mobility training;Therapeutic activities;Patient/family education;Balance training    PT Goals (Current goals can be found in the Care Plan section)  Acute Rehab PT Goals PT Goal Formulation: With patient Time For Goal Achievement: 07/13/22 Potential to Achieve Goals: Good    Frequency Min 3X/week     Co-evaluation               AM-PAC PT "6 Clicks" Mobility  Outcome Measure Help needed turning from your back to your side while in a flat bed without using bedrails?: A Little Help needed moving from lying on your back to sitting on the side of a flat bed without using  bedrails?: A Little Help needed moving to and from a bed to a chair (including a wheelchair)?: A Little Help needed standing up from a chair using your arms (e.g., wheelchair or bedside chair)?: A Little Help needed to walk in hospital room?: A Lot Help needed climbing 3-5 steps with a railing? : A Lot 6 Click Score: 16    End of Session Equipment Utilized During Treatment: Oxygen Activity Tolerance: Patient limited by fatigue Patient left: in bed;with call bell/phone within reach Nurse Communication: Mobility status (notified of HR) PT Visit Diagnosis: Muscle weakness (generalized) (M62.81);Difficulty in walking, not elsewhere classified (R26.2)    Time: [id number] PT Time Calculation (min) (ACUTE ONLY): 23 min   Charges:   PT Evaluation $PT Eval Low Complexity: 1 Low        Kati PT, DPT Physical Therapist Acute Rehabilitation Services Preferred contact method: Secure Chat Weekend Pager Only: [phone] Office: [place]  [place] 06/29/2022, 11:16 AM

## 2022-06-29 NOTE — TOC Initial Note (Addendum)
Transition of Care [facility]) - Initial/Assessment Note    Patient Details  Name: [name] MRN: [id number] Date of Birth: 06/15/1970  [facility]) CM/SW Contact:    Dessa Phi, RN Phone Number: 06/29/2022, 12:45 PM  Clinical Narrative:  PT recc HHPT-patient in agreement to HHPT, already has rw, no preference for [facility] agency. Will check on [facility] agency to accept.  -1:49p-[facility] rep Lattie Haw accepted for HHPT.                Expected Discharge Plan: [place] Barriers to Discharge: Continued Medical Work up   Patient Goals and CMS Choice Patient states their goals for this hospitalization and ongoing recovery are::  (Home)          Expected Discharge Plan and Services                                              Prior Living Arrangements/Services                       Activities of Daily Living Home Assistive Devices/Equipment: None ADL Screening (condition at time of admission) Patient's cognitive ability adequate to safely complete daily activities?: Yes Is the patient deaf or have difficulty hearing?: Yes Does the patient have difficulty seeing, even when wearing glasses/contacts?: No Does the patient have difficulty concentrating, remembering, or making decisions?: No Patient able to express need for assistance with ADLs?: No Does the patient have difficulty dressing or bathing?: No Independently performs ADLs?: No Communication: Independent Dressing (OT): Independent Grooming: Independent Feeding: Independent Bathing: Independent Toileting: Needs assistance Is this a change from baseline?: Change from baseline, expected to last <3 days In/Out Bed: Independent Walks in Home: Independent Does the patient have difficulty walking or climbing stairs?: Yes Weakness of Legs: Both Weakness of Arms/Hands: None  Permission Sought/Granted                  Emotional Assessment              Admission  diagnosis:  Lactic acidosis [E87.20] Acute cystitis without hematuria [N30.00] Fall, initial encounter [W19.XXXA] Septic shock ([place]) [A41.9, R65.21] Closed compression fracture of L4 lumbar vertebra, initial encounter ([place]) [S32.040A] Compression fracture of fourth lumbar vertebra ([place]) [S32.040A] Patient Active Problem List   Diagnosis Date Noted   Mixed diabetic hyperlipidemia associated with type 2 diabetes mellitus ([place]) 06/27/2022   Closed compression fracture of L4 lumbar vertebra, initial encounter ([place]) 06/25/2022   RLQ abdominal pain [id number]   Cyclical vomiting with nausea 05/21/2022   Colitis 03/22/2022   Acute colitis 03/22/2022   Dehydration 03/22/2022   Multiple rib fractures 02/03/2022   Barrett's esophagus    Cyclical vomiting syndrome 09/18/2021   Chronic pain syndrome 09/18/2021   Class 2 obesity due to excess calories with body mass index (BMI) of 37.0 to 37.9 in adult 09/18/2021   Hypothyroidism 09/18/2021   Starvation ketoacidosis 08/27/2021   Viral gastroenteritis 08/27/2021   Intractable vomiting 08/02/2021   Aortic atherosclerosis ([place]) 08/02/2021   LFT elevation 08/02/2021   Hyperbilirubinemia 08/02/2021   Hypoalbuminemia 08/02/2021   Type 2 diabetes mellitus without complication, without long-term current use of insulin ([place]) 08/02/2021   Anoxic brain damage ([facility]) 08/16/2020   History of depression    Sleep disturbance    Anxiety state    N&V (  nausea and vomiting)    Substance abuse (HCC)    Anemia of chronic disease    Acute metabolic encephalopathy [id number]   Hematochezia    Acute blood loss anemia    Acute respiratory failure with hypoxia (HCC)    Non-traumatic rhabdomyolysis    Cardiac arrest ([place]) 06/08/2020   Fracture of ankle, bimalleolar, right, closed 03/28/2020   Iron deficiency 05/05/2019   Symptomatic anemia 05/05/2019   Nausea & vomiting 12/26/2018   Essential hypertension 12/26/2018   ARF (acute renal failure) ([place]) 12/26/2018    Intractable nausea and vomiting 11/28/2018   Bipolar affective disorder ([place]) 11/28/2018   Gastroenteritis 11/19/2015   Hypomagnesemia    C. difficile colitis    SIRS (systemic inflammatory response syndrome) ([place]) 01/11/2015   Cellulitis 01/03/2015   Opiate withdrawal ([place]) 12/18/2014   Depression with anxiety 12/17/2014   Herpes simplex virus type 1 (HSV-1) dermatitis    Sacral fracture ([place]) 12/12/2014   Fall at home, initial encounter 12/12/2014   Chronic lower back pain 12/12/2014   Sacral fracture, closed ([place]) 12/12/2014   Nausea with vomiting    Intractable abdominal pain 10/15/2014   Abdominal pain 10/15/2014   Hypokalemia 09/04/2014   Sepsis ([place]) 09/01/2014   Abdominal pain, generalized 09/01/2014   PTSD (post-traumatic stress disorder) 09/01/2014   Endometriosis 09/01/2014   Sinus tachycardia 09/01/2014   Lactic acidosis 09/01/2014   Sepsis secondary to UTI ([place]) 09/01/2014   PCP:  Ferd Hibbs, NP Pharmacy:   [facility] [address] [place] [address] Phone: [phone] Fax: [phone]     Social Determinants of Health (SDOH) Social History: SDOH Screenings   Food Insecurity: No Food Insecurity (06/25/2022)  Housing: Low Risk  (06/25/2022)  Transportation Needs: No Transportation Needs (06/25/2022)  Utilities: Not At Risk (06/25/2022)  Depression (PHQ2-9): Low Risk  (08/16/2020)  Tobacco Use: Low Risk  (06/25/2022)   SDOH Interventions:     Readmission Risk Interventions    05/22/2022    9:25 AM 04/27/2022    9:05 AM 03/24/2022   11:01 AM  Readmission Risk Prevention Plan  Transportation Screening Complete Complete Complete  Medication Review Press photographer) Complete Complete Complete  PCP or Specialist appointment within 3-5 days of discharge Complete Complete Complete  HRI or Home Care Consult Complete Complete Complete  SW Recovery Care/Counseling Consult Complete Complete Complete   Palliative Care Screening Not Applicable Not Applicable Not [place] Not Applicable Not Applicable Not Applicable

## 2022-06-30 ENCOUNTER — Inpatient Hospital Stay (HOSPITAL_COMMUNITY): Payer: Medicare Other

## 2022-06-30 DIAGNOSIS — I509 Heart failure, unspecified: Secondary | ICD-10-CM

## 2022-06-30 LAB — COMPREHENSIVE METABOLIC PANEL
ALT: 30 U/L (ref 0–44)
AST: 42 U/L — ABNORMAL HIGH (ref 15–41)
Albumin: 2.6 g/dL — ABNORMAL LOW (ref 3.5–5.0)
Alkaline Phosphatase: 66 U/L (ref 38–126)
Anion gap: 7 (ref 5–15)
BUN: <5 mg/dL — ABNORMAL LOW (ref 6–20)
CO2: 28 mmol/L (ref 22–32)
Calcium: 8 mg/dL — ABNORMAL LOW (ref 8.9–10.3)
Chloride: 102 mmol/L (ref 98–111)
Creatinine, Ser: 0.67 mg/dL (ref 0.44–1.00)
GFR, Estimated: >60 mL/min (ref >60)
Glucose, Bld: 95 mg/dL (ref 70–99)
Potassium: 3.4 mmol/L — ABNORMAL LOW (ref 3.5–5.1)
Sodium: 137 mmol/L (ref 135–145)
Total Bilirubin: 0.5 mg/dL (ref 0.3–1.2)
Total Protein: 5.6 g/dL — ABNORMAL LOW (ref 6.5–8.1)

## 2022-06-30 LAB — CBC WITH DIFFERENTIAL/PLATELET
Abs Immature Granulocytes: 0.19 10*3/uL — ABNORMAL HIGH (ref 0.00–0.07)
Basophils Absolute: 0.1 10*3/uL (ref 0.0–0.1)
Basophils Relative: 1 %
Eosinophils Absolute: 0.4 10*3/uL (ref 0.0–0.5)
Eosinophils Relative: 4 %
HCT: 35.2 % — ABNORMAL LOW (ref 36.0–46.0)
Hemoglobin: 11 g/dL — ABNORMAL LOW (ref 12.0–15.0)
Immature Granulocytes: 2 %
Lymphocytes Relative: 20 %
Lymphs Abs: 1.9 10*3/uL (ref 0.7–4.0)
MCH: 30.1 pg (ref 26.0–34.0)
MCHC: 31.3 g/dL (ref 30.0–36.0)
MCV: 96.4 fL (ref 80.0–100.0)
Monocytes Absolute: 0.7 10*3/uL (ref 0.1–1.0)
Monocytes Relative: 7 %
Neutro Abs: 6.2 10*3/uL (ref 1.7–7.7)
Neutrophils Relative %: 66 %
Platelets: 230 10*3/uL (ref 150–400)
RBC: 3.65 MIL/uL — ABNORMAL LOW (ref 3.87–5.11)
RDW: 15.4 % (ref 11.5–15.5)
WBC: 9.5 10*3/uL (ref 4.0–10.5)
nRBC: 0.3 % — ABNORMAL HIGH (ref 0.0–0.2)

## 2022-06-30 LAB — ECHOCARDIOGRAM COMPLETE
Area-P 1/2: 3.77 cm2
Height: 64 in
MV M vel: 5.06 m/s
MV Peak grad: 102.4 mmHg
P 1/2 time: 351 msec
Radius: 0.4 cm
S' Lateral: 3.3 cm
Weight: 2960 oz

## 2022-06-30 LAB — GLUCOSE, CAPILLARY
Glucose-Capillary: 91 mg/dL (ref 70–99)
Glucose-Capillary: 94 mg/dL (ref 70–99)
Glucose-Capillary: 132 mg/dL — ABNORMAL HIGH (ref 70–99)

## 2022-06-30 LAB — BRAIN NATRIURETIC PEPTIDE: B Natriuretic Peptide: 394.5 pg/mL — ABNORMAL HIGH (ref 0.0–100.0)

## 2022-06-30 LAB — CULTURE, BLOOD (ROUTINE X 2)
Culture: NO GROWTH
Culture: NO GROWTH
Special Requests: ADEQUATE
Special Requests: ADEQUATE

## 2022-06-30 LAB — MAGNESIUM: Magnesium: 2 mg/dL (ref 1.7–2.4)

## 2022-06-30 LAB — TROPONIN I (HIGH SENSITIVITY): Troponin I (High Sensitivity): 10 ng/L (ref <18)

## 2022-06-30 MED ORDER — METOPROLOL SUCCINATE ER 100 MG PO TB24
100.0000 mg | ORAL_TABLET | Freq: Every day | ORAL | Status: DC
  Administered 2022-07-01: 100 mg via ORAL
  Filled 2022-06-30: qty 1

## 2022-06-30 MED ORDER — POTASSIUM CHLORIDE CRYS ER 20 MEQ PO TBCR
40.0000 meq | EXTENDED_RELEASE_TABLET | Freq: Once | ORAL | Status: AC
  Administered 2022-06-30: 40 meq via ORAL
  Filled 2022-06-30: qty 2

## 2022-06-30 NOTE — Progress Notes (Signed)
  Echocardiogram 2D Echocardiogram has been performed.  [name] 06/30/2022, 10:54 AM

## 2022-06-30 NOTE — Progress Notes (Signed)
PROGRESS NOTE   [name]  [id number] DOB: 08/18/1970 DOA: 06/25/2022 PCP: Ferd Hibbs, NP   Date of Service: the patient was seen and examined on 06/30/2022  Brief Narrative:  52 y.o. female with medical history significant for cardiac arrest [id number], cyclical vomiting syndrome, obesity, hypertension, diabetes mellitus, chronic low back pain, degenerative disc disease, anxiety disorder who presents to the emergency room for evaluation of several falls over the weekend.  Upon arrival to the ER she was hypotensive with systolic blood pressure of 82 mmHg.  Patient was also found to have multiple SIRS criteria and urinalysis suggestive of urinary tract infection all concerning for sepsis.  Imaging showed  New mild superior endplate compression deformity at L4, favored to be acute.  Orthopedic surgery was consulted in the ER and they recommended LSO brace.  Patient was admitted to the hospitalist service.  Patient was treated with intravenous ceftriaxone for sepsis secondary to urinary tract infection.  Urine cultures ended up growing out Klebsiella.  Patient's concurrent lactic acidosis was managed with aggressive intravenous volume resuscitation.  Concerning patient's compression fracture, patient was initially evaluated by interventional radiology however they opted to postpone a kyphoplasty in the setting of sepsis.     Assessment and Plan: * Sepsis secondary to UTI ([place]) Klebsiella identified on urine culture 3/4, sensitivities reveal sensitivity to ceftriaxone Leukocytosis has resolved.  Abdominal discomfort nausea and vomiting are improving. Patient is on day 4 of ceftriaxone.   Tachycardia persisting although this is more likely secondary to inappropriate sinus tachycardia. Lactic acidosis resolved. Attempting to transition patient off of intravenous fluids today. Blood cultures remain without growth CT imaging on 3/4 did not reveal a definitive focus of infection. ED  provider mention septic shock although I disagree and believe septic shock is ruled out.  Closed compression fracture of L4 lumbar vertebra, initial encounter [facility]) Imaging on admission shows new mild superior endplate compression deformity at L4 Continued complaints of severe low back pain although this is somewhat improved compared to yesterday.   IR evaluated patient for possible kyphoplasty on 3/6 but did not not proceed due to active infection  Lidoderm patches to painful areas of back are being provided Continue as needed intravenous Toradol Continue as needed opiate-based analgesics PT/OT evaluation -recommending home health physical therapy Orthopedic surgery consult has been requested and they recommended an LSO brace  Inappropriate sinus tachycardia Consistent with sinus tachycardia on telemetry and EKGs Review of previous hospitalizations reveals that this has been ongoing for approximately the past year Recent TSH unremarkable Thought initially to be secondary to sepsis, pain and volume depletion but seemingly persisting suggestive of inappropriate sinus tachycardia Will transition patient off of telemetry We will transition patient from metoprolol to tartrate to metoprolol succinate Echocardiogram revealing preserved ejection fraction, normal troponin  Lactic acidosis Lactic acidosis likely secondary to sepsis with concurrent volume depletion Improved with intravenous volume resuscitation   Intractable nausea and vomiting Multiple hospitalizations for intractable nausea and vomiting in the past Patient has undergone extensive workup including multiple endoscopic evaluations.  Patient follows with [facility]. Last EGD 09/2021 revealing small hiatal hernia and patchy moderate inflammation characterized by edema and erythema in the gastric antrum. Suspected history of cyclical vomiting syndrome in the past Symptoms possibly exacerbated by underlying infection on  this presentation Symptoms do seem to be improving today. CT imaging reveals no evidence of small bowel obstruction Treating with as needed antiemetics   Fall at home, initial encounter Thought to be secondary to acute  illness with hypotension and lightheadedness Treating underlying infection, hydrating patient with intravenous isotonic fluids Fall precautions PT/OT evaluation performed-recommending home health physical therapy.   Type 2 diabetes mellitus without complication, without long-term current use of insulin (HCC) Hemoglobin A1c 5.3 on 1/30  Diabetic diet as tolerated  Accu-Cheks before every meal and at bedtime with sliding scale insulin  Holding oral hypoglycemics  Hypothyroidism Continue Synthroid  Hypomagnesemia Replaced  Essential hypertension As needed intravenous antihypertensives for markedly elevated blood pressure   Hypokalemia Recurrent Replacing with potassium chloride Evaluating for concurrent hypomagnesemia  Monitoring potassium levels with serial chemistries.    Mixed diabetic hyperlipidemia associated with type 2 diabetes mellitus ([place]) Continuing home regimen of lipid lowering therapy.   Depression with anxiety Continue trazodone, Abilify and fluoxetine     Subjective:  States that her nausea is substantially improving and that her oral intake is improving today.  Patient denies any episodes of vomiting.  Patient still complaining of lumbar pain, sharp in quality, moderate intensity.  Patient reports that this pain is improved since yesterday.  Physical Exam:  Vitals:   06/30/22 0517 06/30/22 1112 06/30/22 1400 06/30/22 2036  BP:  (!) 146/85 117/83 (!) 128/91  Pulse:  92 70 92  Resp:   (!) 23 18  Temp: (!) 97.4 F (36.3 C)  98 F (36.7 C) 98 F (36.7 C)  TempSrc: Oral  Oral Oral  SpO2:    96%  Weight:      Height:        Constitutional: Awake alert and oriented x3, no associated distress.   Skin: no rashes, no lesions, good  skin turgor noted. Eyes: Pupils are equally reactive to light.  No evidence of scleral icterus or conjunctival pallor.  ENMT: Moist mucous membranes noted.  Posterior pharynx clear of any exudate or lesions.   Respiratory: clear to auscultation bilaterally, no wheezing, no crackles. Normal respiratory effort. No accessory muscle use.  Cardiovascular: Regular rate and rhythm, no murmurs / rubs / gallops. No extremity edema. 2+ pedal pulses. No carotid bruits.  Abdomen: Diffuse mild tenderness.  Abdomen is soft.  No evidence of intra-abdominal masses.  Positive bowel sounds noted in all quadrants.   Musculoskeletal: No joint deformity upper and lower extremities. Good ROM, no contractures. Normal muscle tone. Back: Notable lumbar tenderness without crepitus or deformity.   Data Reviewed:  I have personally reviewed and interpreted labs, imaging.  Significant findings are   CBC: Recent Labs  Lab 06/25/22 1038 06/25/22 1104 06/26/22 0651 06/27/22 0354 06/28/22 0326 06/29/22 0251 06/30/22 0336  WBC 11.1*  --  12.5* 19.9* 12.3* 10.6* 9.5  NEUTROABS 7.3  --   --  18.1* 8.5* 6.6 6.2  HGB 15.1*   < > 12.9 12.4 13.1 10.8* 11.0*  HCT 45.7   < > 39.8 36.4 40.5 33.5* 35.2*  MCV 92.0  --  93.2 90.3 92.9 94.6 96.4  PLT 377  --  307 377 259 230 230   < > = values in this interval not displayed.   Basic Metabolic Panel: Recent Labs  Lab 06/25/22 1038 06/25/22 1104 06/26/22 0651 06/27/22 0354 06/28/22 0326 06/29/22 0251 06/30/22 0336  NA 138   < > 139 142 145 140 137  K 3.0*   < > 3.9 3.3* 3.7 3.0* 3.4*  CL 104   < > 108 111 106 103 102  CO2 18*  --  20* 19* '27 26 28  '$ GLUCOSE 137*   < > 107* 166* 88 85  95  BUN 9   < > <5* <5* <5* <5* <5*  CREATININE 1.09*   < > 0.65 0.81 0.71 0.67 0.67  CALCIUM 8.8*  --  8.3* 8.3* 8.6* 8.3* 8.0*  MG 1.3*  --   --   --  1.6* 1.7 2.0   < > = values in this interval not displayed.   GFR: Estimated Creatinine Clearance: 87.2 mL/min (by C-G formula  based on SCr of 0.67 mg/dL). Liver Function Tests: Recent Labs  Lab 06/25/22 1038 06/27/22 0354 06/28/22 0326 06/29/22 0251 06/30/22 0336  AST 49* 47* 42* 65* 42*  ALT '31 24 26 '$ 34 30  ALKPHOS 119 83 73 71 66  BILITOT 0.7 0.6 0.5 0.6 0.5  PROT 7.5 6.3* 6.3* 5.7* 5.6*  ALBUMIN 3.1* 2.6* 2.9* 2.5* 2.6*    Coagulation Profile: Recent Labs  Lab 06/25/22 1556 06/26/22 0651  INR 1.1 1.0     EKG/Telemetry: Personally reviewed.  Rhythm is normal sinus rhythm with heart rate of 90 bpm..   Code Status:  Full code.  Code status decision has been confirmed with: patient Family Communication: Husband is at the bedside who has been updated on plan of care.   Severity of Illness:  The appropriate patient status for this patient is INPATIENT. Inpatient status is judged to be reasonable and necessary in order to provide the required intensity of service to ensure the patient's safety. The patient's presenting symptoms, physical exam findings, and initial radiographic and laboratory data in the context of their chronic comorbidities is felt to place them at high risk for further clinical deterioration. Furthermore, it is not anticipated that the patient will be medically stable for discharge from the hospital within 2 midnights of admission.   * I certify that at the point of admission it is my clinical judgment that the patient will require inpatient hospital care spanning beyond 2 midnights from the point of admission due to high intensity of service, high risk for further deterioration and high frequency of surveillance required.*  Time spent:  50 minutes  Author:  Vernelle Emerald MD  06/30/2022 10:30 PM

## 2022-06-30 NOTE — Plan of Care (Signed)
  Problem: Education: Goal: Knowledge of General Education information will improve Description: Including pain rating scale, medication(s)/side effects and non-pharmacologic comfort measures Outcome: Progressing   Problem: Clinical Measurements: Goal: Diagnostic test results will improve Outcome: Progressing Goal: Respiratory complications will improve Outcome: Progressing Goal: Cardiovascular complication will be avoided Outcome: Progressing   Problem: Activity: Goal: Risk for activity intolerance will decrease Outcome: Progressing   Problem: Nutrition: Goal: Adequate nutrition will be maintained Outcome: Progressing   Problem: Pain Managment: Goal: General experience of comfort will improve Outcome: Progressing   Problem: Safety: Goal: Ability to remain free from injury will improve Outcome: Progressing

## 2022-07-01 LAB — GLUCOSE, CAPILLARY
Glucose-Capillary: 102 mg/dL — ABNORMAL HIGH (ref 70–99)
Glucose-Capillary: 107 mg/dL — ABNORMAL HIGH (ref 70–99)

## 2022-07-01 LAB — CBC WITH DIFFERENTIAL/PLATELET
Abs Immature Granulocytes: 0.12 10*3/uL — ABNORMAL HIGH (ref 0.00–0.07)
Basophils Absolute: 0 10*3/uL (ref 0.0–0.1)
Basophils Relative: 0 %
Eosinophils Absolute: 0.6 10*3/uL — ABNORMAL HIGH (ref 0.0–0.5)
Eosinophils Relative: 7 %
HCT: 37.9 % (ref 36.0–46.0)
Hemoglobin: 11.6 g/dL — ABNORMAL LOW (ref 12.0–15.0)
Immature Granulocytes: 1 %
Lymphocytes Relative: 28 %
Lymphs Abs: 2.5 10*3/uL (ref 0.7–4.0)
MCH: 30.1 pg (ref 26.0–34.0)
MCHC: 30.6 g/dL (ref 30.0–36.0)
MCV: 98.4 fL (ref 80.0–100.0)
Monocytes Absolute: 0.8 10*3/uL (ref 0.1–1.0)
Monocytes Relative: 8 %
Neutro Abs: 5.1 10*3/uL (ref 1.7–7.7)
Neutrophils Relative %: 56 %
Platelets: 237 10*3/uL (ref 150–400)
RBC: 3.85 MIL/uL — ABNORMAL LOW (ref 3.87–5.11)
RDW: 15.4 % (ref 11.5–15.5)
WBC: 9.1 10*3/uL (ref 4.0–10.5)
nRBC: 0.2 % (ref 0.0–0.2)

## 2022-07-01 LAB — MAGNESIUM: Magnesium: 1.8 mg/dL (ref 1.7–2.4)

## 2022-07-01 LAB — COMPREHENSIVE METABOLIC PANEL
ALT: 30 U/L (ref 0–44)
AST: 43 U/L — ABNORMAL HIGH (ref 15–41)
Albumin: 2.3 g/dL — ABNORMAL LOW (ref 3.5–5.0)
Alkaline Phosphatase: 66 U/L (ref 38–126)
Anion gap: 9 (ref 5–15)
BUN: <5 mg/dL — ABNORMAL LOW (ref 6–20)
CO2: 27 mmol/L (ref 22–32)
Calcium: 8 mg/dL — ABNORMAL LOW (ref 8.9–10.3)
Chloride: 105 mmol/L (ref 98–111)
Creatinine, Ser: 0.66 mg/dL (ref 0.44–1.00)
GFR, Estimated: >60 mL/min (ref >60)
Glucose, Bld: 97 mg/dL (ref 70–99)
Potassium: 3.7 mmol/L (ref 3.5–5.1)
Sodium: 141 mmol/L (ref 135–145)
Total Bilirubin: 0.3 mg/dL (ref 0.3–1.2)
Total Protein: 5.4 g/dL — ABNORMAL LOW (ref 6.5–8.1)

## 2022-07-01 LAB — URINALYSIS, W/ REFLEX TO CULTURE (INFECTION SUSPECTED)
Bacteria, UA: NONE SEEN
Bilirubin Urine: NEGATIVE
Glucose, UA: NEGATIVE mg/dL
Hgb urine dipstick: NEGATIVE
Ketones, ur: NEGATIVE mg/dL
Leukocytes,Ua: NEGATIVE
Nitrite: NEGATIVE
Protein, ur: NEGATIVE mg/dL
Specific Gravity, Urine: 1.008 (ref 1.005–1.030)
pH: 5 (ref 5.0–8.0)

## 2022-07-01 MED ORDER — LIDOCAINE 5 % EX PTCH: 1.0000 | MEDICATED_PATCH | CUTANEOUS | 0 refills | Status: DC

## 2022-07-01 MED ORDER — AMOXICILLIN-POT CLAVULANATE 875-125 MG PO TABS: 1.0000 | ORAL_TABLET | Freq: Two times a day (BID) | ORAL | 0 refills | Status: AC

## 2022-07-01 MED ORDER — METOPROLOL SUCCINATE ER 100 MG PO TB24: 100.0000 mg | ORAL_TABLET | Freq: Every day | ORAL | 2 refills | Status: DC

## 2022-07-01 NOTE — Progress Notes (Signed)
Patient discharged: Home with family  Via: Wheelchair   Discharge paperwork given: to patient and family  Reviewed with teach back  PICC and telemetry disconnected  Belongings given to patient

## 2022-07-01 NOTE — Discharge Summary (Signed)
Physician Discharge Summary   Patient: [name] MRN: [id number] DOB: [birth year]  Admit date:     06/25/2022  Discharge date: 07/01/22  Discharge Physician: Vernelle Emerald   PCP: Ferd Hibbs, NP   Recommendations at discharge:   Please take all prescribed medications exactly as instructed including the remaining course of your antibiotic therapy. You have additionally been started any new medication called metoprolol.  This helps to regulate your heart rate.  Please take this daily as instructed. Please follow-up with your primary care provider in 1 to 2 weeks. Concerning your back pain and compression fracture, interventional radiology will be contacting you with an appointment on Monday, 3/11 with a date and time to return back to [facility] to get this procedure done (a Kyphoplasty or Vertebroplasty). You may consume a low carbohydrate low sodium diet Please wear your TLSO back brace is much as tolerated.  This will help to prevent development of an abnormal curvature of the spine as your back heals. Please return to the emergency department if you develop worsening pain, weakness, fevers or inability to tolerate oral intake.  Discharge Diagnoses: Principal Problem:   Sepsis secondary to UTI [facility]) Active Problems:   Closed compression fracture of L4 lumbar vertebra, initial encounter (HCC)   Inappropriate sinus tachycardia   Lactic acidosis   Intractable nausea and vomiting   Fall at home, initial encounter   Type 2 diabetes mellitus without complication, without long-term current use of insulin ([place])   Hypothyroidism   Hypomagnesemia   Essential hypertension   Hypokalemia   Mixed diabetic hyperlipidemia associated with type 2 diabetes mellitus ([place])   Depression with anxiety   Chronic pain syndrome  Resolved Problems:   * No resolved hospital problems. *   Hospital Course: 52 y.o. female with medical history significant for cardiac arrest [id number],  cyclical vomiting syndrome, obesity, hypertension, diabetes mellitus, chronic low back pain, degenerative disc disease, anxiety disorder who presents to the emergency room for evaluation of several falls over the weekend.  Upon arrival to the ER she was hypotensive with systolic blood pressure of 82 mmHg.  Patient was also found to have multiple SIRS criteria and urinalysis suggestive of urinary tract infection all concerning for sepsis.  Imaging showed  New mild superior endplate compression deformity at L4, favored to be acute.  Orthopedic surgery was consulted in the ER and they recommended LSO brace.  Patient was admitted to the hospitalist service.  Patient was treated with intravenous ceftriaxone for sepsis secondary to urinary tract infection.  Urine cultures ended up growing out Klebsiella.  Patient's concurrent lactic acidosis was managed with aggressive intravenous volume resuscitation.  In the days that followed patient's lactic acidosis resolved.  Patient's leukocytosis and other SIRS criteria additionally resolved.  Patient was eventually transitioned to oral Augmentin once culture sensitivities had returned to complete a 7-day course of antibiotics and the remaining course was prescribed at time of discharge.  Concerning patient's compression fracture, patient was initially evaluated by interventional radiology however they opted to postpone a kyphoplasty in the setting of sepsis.  After the patient's sepsis was adequately treated, arrangements were made with interventional radiology for the patient to eventually have a kyphoplasty completed in the outpatient setting.  Interventional radiology is stated that they would contact the patient on 3/11 with a time to arrive at [facility] to have the outpatient procedure done.  Patient was advised to wear her TLSO brace is much as tolerated in the meantime.  Throughout the hospitalization patient also exhibited significant sinus tachycardia.     During this hospitalization it was initially thought that the patient's tachycardia was due to volume depletion and infection but despite hydration and antibiotics the tachycardia did not relent.  Upon thorough review of older medical records it seems the patient has been suffering from ongoing sinus tachycardia for at least 1 year.Echocardiogram was not performed during this hospitalization and was found to have a preserved ejection fraction.  Troponin was found to be unremarkable.  TSH was also unremarkable.  Patient was felt to be suffering from inappropriate sinus tachycardia and was initiated and titrated onto a modest regimen of metoprolol succinate 100 mg daily which achieved excellent rate control and improvement in patient's overall symptoms.  Patient was discharged home in improved and stable condition with home health physical therapy services on 07/01/2022.      Pain control - [employer] Controlled Substance Reporting System database was reviewed. and patient was instructed, not to drive, operate heavy machinery, perform activities at heights, swimming or participation in water activities or provide baby-sitting services while on Pain, Sleep and Anxiety Medications; until their outpatient Physician has advised to do so again. Also recommended to not to take more than prescribed Pain, Sleep and Anxiety Medications.   Consultants: Interventional Radiology Procedures performed: PICC of the Right upper extremity  Disposition: Home health Diet recommendation:  Cardiac and Carb modified diet  DISCHARGE MEDICATION: Allergies as of 07/01/2022   No Known Allergies      Medication List     STOP taking these medications    lidocaine 5 % ointment Commonly known as: XYLOCAINE Replaced by: lidocaine 5 %   metoCLOPramide 5 MG tablet Commonly known as: REGLAN       TAKE these medications    acetaminophen 500 MG tablet Commonly known as: TYLENOL Take 1,000 mg by mouth as  needed for mild pain or headache.   amoxicillin-clavulanate 875-125 MG tablet Commonly known as: AUGMENTIN Take 1 tablet by mouth 2 (two) times daily for 3 doses. Begin taking the evening of 3/10.   ARIPiprazole 5 MG tablet Commonly known as: ABILIFY Take 1 tablet (5 mg total) by mouth daily.   atorvastatin 10 MG tablet Commonly known as: LIPITOR Take 10 mg by mouth every evening.   FLUoxetine 40 MG capsule Commonly known as: PROZAC Take 1 capsule (40 mg total) by mouth daily.   ibuprofen 200 MG tablet Commonly known as: ADVIL Take 400 mg by mouth every 6 (six) hours as needed for moderate pain.   levothyroxine 50 MCG tablet Commonly known as: SYNTHROID Take 50 mcg by mouth daily before breakfast.   lidocaine 5 % Commonly known as: LIDODERM Place 1 patch onto the skin daily. Remove & Discard patch within 12 hours or as directed by MD Replaces: lidocaine 5 % ointment   metFORMIN 500 MG tablet Commonly known as: GLUCOPHAGE Take 500 mg by mouth 2 (two) times daily with a meal.   metoprolol succinate 100 MG 24 hr tablet Commonly known as: TOPROL-XL Take 1 tablet (100 mg total) by mouth daily. Take with or immediately following a meal. Start taking on: July 02, 2022   multivitamin Tabs tablet Take 1 tablet by mouth at bedtime.   ondansetron 8 MG disintegrating tablet Commonly known as: ZOFRAN-ODT Take 1 tablet (8 mg total) by mouth every 8 (eight) hours as needed for nausea or vomiting. What changed:  when to take this Another medication with the same name was  removed. Continue taking this medication, and follow the directions you see here.   OVER THE COUNTER MEDICATION Apply 1 Application topically as needed (itching). OTC Anti itch cream   oxyCODONE-acetaminophen 7.5-325 MG tablet Commonly known as: PERCOCET Take 1 tablet by mouth 4 (four) times daily.   pantoprazole 40 MG tablet Commonly known as: Protonix Take 1 tablet (40 mg total) by mouth 2 (two) times  daily.   prazosin 2 MG capsule Commonly known as: MINIPRESS Take 1 capsule (2 mg total) by mouth at bedtime.   promethazine 25 MG tablet Commonly known as: PHENERGAN Take 25 mg by mouth as needed for nausea or vomiting. What changed: Another medication with the same name was changed. Make sure you understand how and when to take each.   promethazine 25 MG suppository Commonly known as: PHENERGAN Place 1 suppository (25 mg total) rectally every 6 (six) hours as needed for nausea or vomiting. What changed: when to take this   tiZANidine 4 MG tablet Commonly known as: ZANAFLEX Take 3 tablets (12 mg total) by mouth every 8 (eight) hours as needed for up to 12 doses for muscle spasms. What changed: when to take this   traZODone 50 MG tablet Commonly known as: DESYREL Take 3 tablets (150 mg total) by mouth at bedtime.        Follow-up [place]. Follow up.   Why: [facility] Contact information: [place] [address] [phone]         Ferd Hibbs, NP. Schedule an appointment as soon as possible for a visit in 1 week(s).   Specialty: Nurse Practitioner Contact information: [place] [address] [phone]                 Discharge Exam: Filed Weights   06/25/22 0943  Weight: 83.9 kg    Constitutional: Awake alert and oriented x3, no associated distress.   Respiratory: clear to auscultation bilaterally, no wheezing, no crackles. Normal respiratory effort. No accessory muscle use.  Cardiovascular: Regular rate and rhythm, no murmurs / rubs / gallops. No extremity edema. 2+ pedal pulses. No carotid bruits.  Abdomen: Abdomen is soft and nontender.  No evidence of intra-abdominal masses.  Positive bowel sounds noted in all quadrants.   Musculoskeletal: No joint deformity upper and lower extremities. Good ROM, no contractures. Normal muscle tone.     Condition at  discharge: fair  The results of significant diagnostics from this hospitalization (including imaging, microbiology, ancillary and laboratory) are listed below for reference.   Imaging Studies: ECHOCARDIOGRAM COMPLETE  Result Date: 06/30/2022    ECHOCARDIOGRAM REPORT   Patient Name:   [name] Date of Exam: 06/30/2022 Medical Rec #:  [id number]       Height:       64.0 in Accession #:    [id number]      Weight:       185.0 lb Date of Birth:  [birth year]      BSA:          1.893 m Patient Age:    34 years        BP:           109/71 mmHg Patient Gender: F               HR:           85 bpm. Exam Location:  Inpatient Procedure: 2D Echo,  Cardiac Doppler and Color Doppler Indications:    congestive heart failure  History:        Patient has prior history of Echocardiogram examinations, most                 recent 06/09/2020. Risk Factors:Hypertension, Diabetes and                 Dyslipidemia.  Sonographer:    Johny Chess RDCS Referring Phys: [id number] Sherryll Burger [facility]  Sonographer Comments: Image acquisition challenging due to patient body habitus. IMPRESSIONS  1. Left ventricular ejection fraction, by estimation, is 55 to 60%. The left ventricle has normal function. The left ventricle has no regional wall motion abnormalities. Left ventricular diastolic parameters are consistent with Grade I diastolic dysfunction (impaired relaxation). Elevated left atrial pressure.  2. Right ventricular systolic function is normal. The right ventricular size is normal. There is severely elevated pulmonary artery systolic pressure.  3. The mitral valve is abnormal. Moderate mitral valve regurgitation. No evidence of mitral stenosis.  4. The tricuspid valve is abnormal. Tricuspid valve regurgitation is moderate.  5. The aortic valve has an indeterminant number of cusps. Aortic valve regurgitation is mild to moderate. No aortic stenosis is present.  6. The inferior vena cava is normal in size with greater than 50%  respiratory variability, suggesting right atrial pressure of 3 mmHg. FINDINGS  Left Ventricle: Left ventricular ejection fraction, by estimation, is 55 to 60%. The left ventricle has normal function. The left ventricle has no regional wall motion abnormalities. The left ventricular internal cavity size was normal in size. There is  no left ventricular hypertrophy. Left ventricular diastolic parameters are consistent with Grade I diastolic dysfunction (impaired relaxation). Elevated left atrial pressure. Right Ventricle: The right ventricular size is normal. Right vetricular wall thickness was not well visualized. Right ventricular systolic function is normal. There is severely elevated pulmonary artery systolic pressure. The tricuspid regurgitant velocity is 3.92 m/s, and with an assumed right atrial pressure of 3 mmHg, the estimated right ventricular systolic pressure is 123456 mmHg. Left Atrium: Left atrial size was normal in size. Right Atrium: Right atrial size was normal in size. Pericardium: There is no evidence of pericardial effusion. Mitral Valve: The mitral valve is abnormal. Moderate mitral valve regurgitation. No evidence of mitral valve stenosis. Tricuspid Valve: The tricuspid valve is abnormal. Tricuspid valve regurgitation is moderate . No evidence of tricuspid stenosis. Aortic Valve: The aortic valve has an indeterminant number of cusps. Aortic valve regurgitation is mild to moderate. Aortic regurgitation PHT measures 351 msec. No aortic stenosis is present. Pulmonic Valve: The pulmonic valve was not well visualized. Pulmonic valve regurgitation is not visualized. No evidence of pulmonic stenosis. Aorta: The aortic root is normal in size and structure. Venous: The inferior vena cava is normal in size with greater than 50% respiratory variability, suggesting right atrial pressure of 3 mmHg. IAS/Shunts: No atrial level shunt detected by color flow Doppler.  LEFT VENTRICLE PLAX 2D LVIDd:         4.60 cm    Diastology LVIDs:         3.30 cm   LV e' medial:    5.77 cm/s LV PW:         1.00 cm   LV E/e' medial:  20.1 LV IVS:        0.90 cm   LV e' lateral:   6.20 cm/s LVOT diam:     1.80 cm   LV E/e' lateral: 18.7  LV SV:         38 LV SV Index:   20 LVOT Area:     2.54 cm  RIGHT VENTRICLE RV Basal diam:  2.50 cm RV S prime:     11.60 cm/s TAPSE (M-mode): 1.7 cm LEFT ATRIUM             Index        RIGHT ATRIUM           Index LA diam:        3.00 cm 1.59 cm/m   RA Area:     10.70 cm LA Vol (A2C):   43.2 ml 22.83 ml/m  RA Volume:   24.40 ml  12.89 ml/m LA Vol (A4C):   41.2 ml 21.77 ml/m LA Biplane Vol: 46.0 ml 24.31 ml/m  AORTIC VALVE LVOT Vmax:   93.00 cm/s LVOT Vmean:  59.000 cm/s LVOT VTI:    0.149 m AI PHT:      351 msec  AORTA Ao Root diam: 2.80 cm Ao Asc diam:  2.90 cm MITRAL VALVE                  TRICUSPID VALVE MV Area (PHT): 3.77 cm       TR Peak grad:   61.5 mmHg MV Decel Time: 201 msec       TR Vmax:        392.00 cm/s MR Peak grad:    102.4 mmHg MR Mean grad:    70.0 mmHg    SHUNTS MR Vmax:         506.00 cm/s  Systemic VTI:  0.15 m MR Vmean:        404.0 cm/s   Systemic Diam: 1.80 cm MR PISA:         1.01 cm MR PISA Eff ROA: 8 mm MR PISA Radius:  0.40 cm MV E velocity: 116.00 cm/s MV A velocity: 124.00 cm/s MV E/A ratio:  0.94 Carlyle Dolly MD Electronically signed by Carlyle Dolly MD Signature Date/Time: 06/30/2022/1:57:40 PM    Final    EEG adult  Result Date: 06/28/2022 Lora Havens, MD     06/28/2022  4:52 PM Patient Name: [name] MRN: [id number] Epilepsy Attending: Lora Havens Referring Physician/Provider: Vernelle Emerald, MD Date: 06/28/2022 Duration: 23.06 mins Patient history: 52yo F with ams. EEG to evaluate for seizure. Level of alertness: Awake AEDs during EEG study: None Technical aspects: This EEG study was done with scalp electrodes positioned according to the 10-20 International system of electrode placement. Electrical activity was reviewed with band pass filter  of 1-'70Hz'$ , sensitivity of 7 uV/mm, display speed of 108m/sec with a '60Hz'$  notched filter applied as appropriate. EEG data were recorded continuously and digitally stored.  Video monitoring was available and reviewed as appropriate. Description: The posterior dominant rhythm consists of 8 Hz activity of moderate voltage (25-35 uV) seen predominantly in posterior head regions, symmetric and reactive to eye opening and eye closing. Hyperventilation and photic stimulation were not performed.   IMPRESSION: This study is within normal limits. No seizures or epileptiform discharges were seen throughout the recording. Priyanka O Yadav   [place] SITE RITE  Result Date: 06/27/2022 If Site Rite image not attached, placement could not be confirmed due to current cardiac rhythm.  DG Abd 1 View  Result Date: 06/27/2022 CLINICAL DATA:  Severe nausea. Clinical concern for bowel obstruction. EXAM: ABDOMEN - 1 VIEW COMPARISON:  Chest, abdomen and pelvis CT dated  06/25/2022 FINDINGS: Normal bowel gas pattern. Unremarkable bones. The patient refused a 2nd image to include the inferior pelvis. IMPRESSION: No acute abnormality.  No evidence of bowel obstruction. Electronically Signed   By: Claudie Revering M.D.   On: 06/27/2022 09:34   MR LUMBAR SPINE WO CONTRAST  Result Date: 06/26/2022 CLINICAL DATA:  Compression fracture, lumbar EXAM: MRI LUMBAR SPINE WITHOUT CONTRAST TECHNIQUE: Multiplanar, multisequence MR imaging of the lumbar spine was performed. No intravenous contrast was administered. COMPARISON:  CT 06/25/2022, MRI 09/25/2020 FINDINGS: Segmentation:  Standard. Alignment:  Physiologic. Vertebrae: Acute superior endplate compression fracture of the L4 vertebral body approximately 20% vertebral body height loss. Bone marrow edema at the fracture site. No significant bony retropulsion. No evidence of fracture extension into the posterior elements. Chronic mild superior endplate compression fracture of the L3 vertebral body with  associated Schmorl's node. No residual bone marrow edema. Remaining vertebral body heights are maintained. No additional fractures. No evidence of discitis. No marrow replacing bone lesion. Conus medullaris and cauda equina: Conus extends to the L2-L3 level. Conus and cauda equina appear normal. Paraspinal and other soft tissues: Negative. Disc levels: T12-L1 and L1-L2: Unremarkable. L2-L3: Disc desiccation without focal disc protrusion. No foraminal or canal stenosis. L3-L4: No significant disc protrusion, foraminal stenosis, or canal stenosis. L4-L5: No significant disc protrusion, foraminal stenosis, or canal stenosis. L5-S1: Disc desiccation and height loss with mild bulging disc, eccentric to the left. Mild left foraminal stenosis. No canal stenosis. IMPRESSION: 1. Acute superior endplate compression fracture of the L4 vertebral body with approximately 20% vertebral body height loss. No bony retropulsion. 2. Chronic mild superior endplate compression fracture of the L3 vertebral body. 3. Mild degenerative disc disease at L5-S1 with mild left foraminal stenosis. No canal stenosis at any level. Electronically Signed   By: Davina Poke D.O.   On: 06/26/2022 14:37   CT Head Wo Contrast  Result Date: 06/25/2022 CLINICAL DATA:  Fall EXAM: CT HEAD WITHOUT CONTRAST CT CERVICAL SPINE WITHOUT CONTRAST CT THORACIC SPINE WITHOUT CONTRAST CT LUMBAR  SPINE WITHOUT CONTRAST TECHNIQUE: Multidetector CT imaging of the head and cervical, thoracic, and lumbar spine was performed following the standard protocol without intravenous contrast. Multiplanar CT image reconstructions of the cervical spine were also generated. RADIATION DOSE REDUCTION: This exam was performed according to the departmental dose-optimization program which includes automated exposure control, adjustment of the mA and/or kV according to patient size and/or use of iterative reconstruction technique. COMPARISON:  None Available. FINDINGS: CT HEAD  FINDINGS Brain: No evidence of acute infarction, hemorrhage, hydrocephalus, extra-axial collection or mass lesion/mass effect. Vascular: No hyperdense vessel or unexpected calcification. Skull: Normal. Negative for fracture or focal lesion. Sinuses/Orbits: No middle ear or mastoid effusion. Paranasal sinuses are notable for trace mucosal thickening of the floor of the right maxillary sinus. Orbits are unremarkable. Other: None. CT CERVICAL, THORACIC, LUMBAR SPINE FINDINGS Alignment: Straightening of the normal cervical lordosis. Skull base and vertebrae: Cervical spine - no acute fracture. No primary bone lesion or focal pathologic process. Thoracic spine - mild superior endplate height loss at T11 is favored to be secondary to a Schmorl's node. Lumbar spine - compared to 03/21/22, there is a new mild superior endplate compression deformity at L4. Unchanged Schmorl's node at L3. Soft tissues and spinal canal: No prevertebral fluid or swelling. No visible canal hematoma. Disc levels:  No evidence of high-grade spinal canal stenosis. Visualized chest: Negative. Please see same day CT chest abdomen and pelvis for additional findings, including a  small left posterior diaphragmatic hernia, which is likely congenital. Other: There are few prominent cervical lymph nodes, which are nonspecific, and likely reactive. IMPRESSION: 1. No CT evidence of intracranial injury. 2. No acute fracture or traumatic listhesis in the cervical or thoracic spine 3. New mild superior endplate compression deformity at L4, favored to be acute. Electronically Signed   By: Marin Roberts M.D.   On: 06/25/2022 12:23   CT Cervical Spine Wo Contrast  Result Date: 06/25/2022 CLINICAL DATA:  Fall EXAM: CT HEAD WITHOUT CONTRAST CT CERVICAL SPINE WITHOUT CONTRAST CT THORACIC SPINE WITHOUT CONTRAST CT LUMBAR  SPINE WITHOUT CONTRAST TECHNIQUE: Multidetector CT imaging of the head and cervical, thoracic, and lumbar spine was performed following the  standard protocol without intravenous contrast. Multiplanar CT image reconstructions of the cervical spine were also generated. RADIATION DOSE REDUCTION: This exam was performed according to the departmental dose-optimization program which includes automated exposure control, adjustment of the mA and/or kV according to patient size and/or use of iterative reconstruction technique. COMPARISON:  None Available. FINDINGS: CT HEAD FINDINGS Brain: No evidence of acute infarction, hemorrhage, hydrocephalus, extra-axial collection or mass lesion/mass effect. Vascular: No hyperdense vessel or unexpected calcification. Skull: Normal. Negative for fracture or focal lesion. Sinuses/Orbits: No middle ear or mastoid effusion. Paranasal sinuses are notable for trace mucosal thickening of the floor of the right maxillary sinus. Orbits are unremarkable. Other: None. CT CERVICAL, THORACIC, LUMBAR SPINE FINDINGS Alignment: Straightening of the normal cervical lordosis. Skull base and vertebrae: Cervical spine - no acute fracture. No primary bone lesion or focal pathologic process. Thoracic spine - mild superior endplate height loss at T11 is favored to be secondary to a Schmorl's node. Lumbar spine - compared to 03/21/22, there is a new mild superior endplate compression deformity at L4. Unchanged Schmorl's node at L3. Soft tissues and spinal canal: No prevertebral fluid or swelling. No visible canal hematoma. Disc levels:  No evidence of high-grade spinal canal stenosis. Visualized chest: Negative. Please see same day CT chest abdomen and pelvis for additional findings, including a small left posterior diaphragmatic hernia, which is likely congenital. Other: There are few prominent cervical lymph nodes, which are nonspecific, and likely reactive. IMPRESSION: 1. No CT evidence of intracranial injury. 2. No acute fracture or traumatic listhesis in the cervical or thoracic spine 3. New mild superior endplate compression deformity at  L4, favored to be acute. Electronically Signed   By: Marin Roberts M.D.   On: 06/25/2022 12:23   CT T-SPINE NO CHARGE  Result Date: 06/25/2022 CLINICAL DATA:  Fall EXAM: CT HEAD WITHOUT CONTRAST CT CERVICAL SPINE WITHOUT CONTRAST CT THORACIC SPINE WITHOUT CONTRAST CT LUMBAR  SPINE WITHOUT CONTRAST TECHNIQUE: Multidetector CT imaging of the head and cervical, thoracic, and lumbar spine was performed following the standard protocol without intravenous contrast. Multiplanar CT image reconstructions of the cervical spine were also generated. RADIATION DOSE REDUCTION: This exam was performed according to the departmental dose-optimization program which includes automated exposure control, adjustment of the mA and/or kV according to patient size and/or use of iterative reconstruction technique. COMPARISON:  None Available. FINDINGS: CT HEAD FINDINGS Brain: No evidence of acute infarction, hemorrhage, hydrocephalus, extra-axial collection or mass lesion/mass effect. Vascular: No hyperdense vessel or unexpected calcification. Skull: Normal. Negative for fracture or focal lesion. Sinuses/Orbits: No middle ear or mastoid effusion. Paranasal sinuses are notable for trace mucosal thickening of the floor of the right maxillary sinus. Orbits are unremarkable. Other: None. CT CERVICAL, THORACIC, LUMBAR SPINE FINDINGS Alignment: Straightening  of the normal cervical lordosis. Skull base and vertebrae: Cervical spine - no acute fracture. No primary bone lesion or focal pathologic process. Thoracic spine - mild superior endplate height loss at T11 is favored to be secondary to a Schmorl's node. Lumbar spine - compared to 03/21/22, there is a new mild superior endplate compression deformity at L4. Unchanged Schmorl's node at L3. Soft tissues and spinal canal: No prevertebral fluid or swelling. No visible canal hematoma. Disc levels:  No evidence of high-grade spinal canal stenosis. Visualized chest: Negative. 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 Other: There are few prominent cervical lymph nodes, which are nonspecific, and likely reactive. IMPRESSION: 1. No CT evidence of intracranial injury. 2. No acute fracture or traumatic listhesis in the cervical or thoracic spine 3. New mild superior endplate compression deformity at L4, favored to be acute. Electronically Signed   By: Marin Roberts M.D.   On: 06/25/2022 12:23   CT L-SPINE NO CHARGE  Result Date: 06/25/2022 CLINICAL DATA:  Fall EXAM: CT HEAD WITHOUT CONTRAST CT CERVICAL SPINE WITHOUT CONTRAST CT THORACIC SPINE WITHOUT CONTRAST CT LUMBAR  SPINE WITHOUT CONTRAST TECHNIQUE: Multidetector CT imaging of the head and cervical, thoracic, and lumbar spine was performed following the standard protocol without intravenous contrast. Multiplanar CT image reconstructions of the cervical spine were also generated. RADIATION DOSE REDUCTION: This exam was performed according to the departmental dose-optimization program which includes automated exposure control, adjustment of the mA and/or kV according to patient size and/or use of iterative reconstruction technique. COMPARISON:  None Available. FINDINGS: CT HEAD FINDINGS Brain: No evidence of acute infarction, hemorrhage, hydrocephalus, extra-axial collection or mass lesion/mass effect. Vascular: No hyperdense vessel or unexpected calcification. Skull: Normal. Negative for fracture or focal lesion. Sinuses/Orbits: No middle ear or mastoid effusion. Paranasal sinuses are notable for trace mucosal thickening of the floor of the right maxillary sinus. Orbits are unremarkable. Other: None. CT CERVICAL, THORACIC, LUMBAR SPINE FINDINGS Alignment: Straightening of the normal cervical lordosis. Skull base and vertebrae: Cervical spine - no acute fracture. No primary bone lesion or focal pathologic process. Thoracic spine - mild superior  endplate height loss at T11 is favored to be secondary to a Schmorl's node. Lumbar spine - compared to 03/21/22, there is a new mild superior endplate compression deformity at L4. Unchanged Schmorl's node at L3. Soft tissues and spinal canal: No prevertebral fluid or swelling. No visible canal hematoma. Disc levels:  No evidence of high-grade spinal canal stenosis. Visualized chest: Negative. Please see same day CT chest abdomen and pelvis for additional findings, including a small left posterior diaphragmatic hernia, which is likely congenital. Other: There are few prominent cervical lymph nodes, which are nonspecific, and likely reactive. IMPRESSION: 1. No CT evidence of intracranial injury. 2. No acute fracture or traumatic listhesis in the cervical or thoracic spine 3. New mild superior endplate compression deformity at L4, favored to be acute. Electronically Signed   By: Marin Roberts M.D.   On: 06/25/2022 12:23   CT CHEST ABDOMEN PELVIS W CONTRAST  Result Date: 06/25/2022 CLINICAL DATA:  52 year old female status post two falls recently. Pain, nausea vomiting. EXAM: CT CHEST, ABDOMEN, AND PELVIS WITH CONTRAST TECHNIQUE: Multidetector CT imaging of the chest, abdomen and pelvis was performed following the standard protocol during bolus administration of intravenous contrast. RADIATION DOSE REDUCTION: This exam was performed according to the departmental dose-optimization program which includes automated  exposure control, adjustment of the mA and/or kV according to patient size and/or use of iterative reconstruction technique. CONTRAST:  157m OMNIPAQUE IOHEXOL 300 MG/ML  SOLN COMPARISON:  CTA chest 03/03/2022. CT thoracic and lumbar spine today reported separately. CT Abdomen and Pelvis 05/21/2022. FINDINGS: CT CHEST FINDINGS Cardiovascular: Negative. No pericardial effusion. No significant atherosclerosis identified. Mediastinum/Nodes: Negative. No mediastinal hematoma or lymphadenopathy. Lungs/Pleura:  Major airways are patent. Similar lung volumes to last year. Chronic elevation of the right hemidiaphragm. Mild linear right lung base scarring/atelectasis. No pneumothorax, pleural effusion, pulmonary contusion, or other abnormal lung opacity. Musculoskeletal: Thoracic spine is detailed separately. Visible shoulder osseous structures appear intact. No acute sternal fracture. Chronic rib fractures (such as right anterior 3rd rib). No acute rib fracture identified. CT ABDOMEN PELVIS FINDINGS Hepatobiliary: Chronic fatty liver. Liver and gallbladder appear intact. No perihepatic fluid. No bile duct enlargement. Pancreas: Partial atrophy. Spleen: Negative. Adrenals/Urinary Tract: Negative. Stomach/Bowel: Negative large bowel, much of the large bowel appears stable and negative, including retrocecal appendix (series 2, image 91). Conspicuous caliber change in the ascending colon along the 3 cm segment on coronal image 62 is new compared to January and probably peristalsis artifact. Negative terminal ileum. No dilated small bowel. Stomach and duodenum appear negative. No free air or free fluid identified. Vascular/Lymphatic: Calcified aortic atherosclerosis. Major arterial structures appear patent and intact. Portal venous system is patent. No lymphadenopathy identified. Reproductive: Negative. Other: No pelvis free fluid. Musculoskeletal: Lumbar spine detailed separately., and Sacrum, SI joints, pelvis (chronic inferior pubic rami fractures) proximal femurs appear stable and intact. No superficial soft tissue injury identified. IMPRESSION: 1. No acute traumatic injury identified in the chest, abdomen, or pelvis. See Thoracic and Lumbar Spine CT reported separately. 2. Conspicuous caliber change in the ascending colon, but that same segment appeared negative in January, so suspect peristalsis artifact. Recommend up-to-date colon cancer screening for this patient. 3. Fatty liver. Electronically Signed   By: HGenevie AnnM.D.    On: 06/25/2022 12:21    Microbiology: Results for orders placed or performed during the hospital encounter of 06/25/22  Resp panel by RT-PCR (RSV, Flu A&B, Covid) Anterior Nasal Swab     Status: None   Collection Time: 06/25/22  1:21 PM   Specimen: Anterior Nasal Swab  Result Value Ref Range Status   SARS Coronavirus 2 by RT PCR NEGATIVE NEGATIVE Final    Comment: (NOTE) SARS-CoV-2 target nucleic acids are NOT DETECTED.  The SARS-CoV-2 RNA is generally detectable in upper respiratory specimens during the acute phase of infection. The lowest concentration of SARS-CoV-2 viral copies this assay can detect is 138 copies/mL. A negative result does not preclude SARS-Cov-2 infection and should not be used as the sole basis for treatment or other patient management decisions. A negative result may occur with  improper specimen collection/handling, submission of specimen other than nasopharyngeal swab, presence of viral mutation(s) within the areas targeted by this assay, and inadequate number of viral copies(<138 copies/mL). A negative result must be combined with clinical observations, patient history, and epidemiological information. The expected result is Negative.  Fact Sheet for Patients:  hEntrepreneurPulse.com.au Fact Sheet for Healthcare Providers:  hIncredibleEmployment.be This test is no t yet approved or cleared by the [place] and  has been authorized for detection and/or diagnosis of SARS-CoV-2 by FDA under an Emergency Use Authorization (EUA). This EUA will remain  in effect (meaning this test can be used) for the duration of the COVID-19 declaration under Section 564(b)(1)  of the Act, 21 U.S.C.section 360bbb-3(b)(1), unless the authorization is terminated  or revoked sooner.       Influenza A by PCR NEGATIVE NEGATIVE Final   Influenza B by PCR NEGATIVE NEGATIVE Final    Comment: (NOTE) The Xpert Xpress SARS-CoV-2/FLU/RSV  plus assay is intended as an aid in the diagnosis of influenza from Nasopharyngeal swab specimens and should not be used as a sole basis for treatment. Nasal washings and aspirates are unacceptable for Xpert Xpress SARS-CoV-2/FLU/RSV testing.  Fact Sheet for Patients: EntrepreneurPulse.com.au  Fact Sheet for Healthcare Providers: IncredibleEmployment.be  This test is not yet approved or cleared by the [place] FDA and has been authorized for detection and/or diagnosis of SARS-CoV-2 by FDA under an Emergency Use Authorization (EUA). This EUA will remain in effect (meaning this test can be used) for the duration of the COVID-19 declaration under Section 564(b)(1) of the Act, 21 U.S.C. section 360bbb-3(b)(1), unless the authorization is terminated or revoked.     Resp Syncytial Virus by PCR NEGATIVE NEGATIVE Final    Comment: (NOTE) Fact Sheet for Patients: EntrepreneurPulse.com.au  Fact Sheet for Healthcare Providers: IncredibleEmployment.be  This test is not yet approved or cleared by the [place] FDA and has been authorized for detection and/or diagnosis of SARS-CoV-2 by FDA under an Emergency Use Authorization (EUA). This EUA will remain in effect (meaning this test can be used) for the duration of the COVID-19 declaration under Section 564(b)(1) of the Act, 21 U.S.C. section 360bbb-3(b)(1), unless the authorization is terminated or revoked.  Performed at [facility], [place] [address], [place], [place] [address]   Urine Culture     Status: Abnormal   Collection Time: 06/25/22  1:25 PM   Specimen: Urine, Random  Result Value Ref Range Status   Specimen Description   Final    URINE, RANDOM Performed at [place] [address], [place], [place] [address]    Special Requests   Final    NONE Reflexed from [phone] Performed at [facility], [place] [address], [place], [place] [address]    Culture >=100,000 COLONIES/mL KLEBSIELLA PNEUMONIAE (A)  Final   Report Status 06/28/2022 FINAL  Final   Organism ID, Bacteria KLEBSIELLA PNEUMONIAE (A)  Final      Susceptibility   Klebsiella pneumoniae - MIC*    AMPICILLIN RESISTANT Resistant     CEFAZOLIN <=4 SENSITIVE Sensitive     CEFEPIME <=0.12 SENSITIVE Sensitive     CEFTRIAXONE <=0.25 SENSITIVE Sensitive     CIPROFLOXACIN <=0.25 SENSITIVE Sensitive     GENTAMICIN <=1 SENSITIVE Sensitive     IMIPENEM <=0.25 SENSITIVE Sensitive     NITROFURANTOIN 64 INTERMEDIATE Intermediate     TRIMETH/SULFA <=20 SENSITIVE Sensitive     AMPICILLIN/SULBACTAM 4 SENSITIVE Sensitive     PIP/TAZO <=4 SENSITIVE Sensitive     * >=100,000 COLONIES/mL KLEBSIELLA PNEUMONIAE  Blood culture (routine x 2)     Status: None   Collection Time: 06/25/22  1:40 PM   Specimen: BLOOD RIGHT HAND  Result Value Ref Range Status   Specimen Description   Final    BLOOD RIGHT HAND Performed at [facility], [place] [address], [place], [place] [address]    Special Requests   Final    BOTTLES DRAWN AEROBIC AND ANAEROBIC Blood Culture adequate volume Performed at [place] [address] [place], [place] [address]    Culture   Final    NO GROWTH 5  DAYS Performed at [facility], [place] [address] [place], [place] [address]    Report Status 06/30/2022 FINAL  Final  Blood culture (routine x 2)     Status: None   Collection Time: 06/25/22  1:44 PM   Specimen: BLOOD LEFT HAND  Result Value Ref Range Status   Specimen Description   Final    BLOOD LEFT HAND Performed at [facility], [place] [address] [place], [place] [address]    Special Requests   Final    BOTTLES DRAWN AEROBIC AND ANAEROBIC Blood Culture adequate volume Performed at [place] [address] [place], [place] [address]    Culture   Final    NO GROWTH 5 DAYS Performed at [facility], [place] [address] [place], [place] [address]    Report Status 06/30/2022 FINAL  Final  MRSA Next Gen by PCR, Nasal     Status: None   Collection Time: 06/27/22  4:30 PM   Specimen: Nasal Mucosa; Nasal Swab  Result Value Ref Range Status   MRSA by PCR Next Gen NOT DETECTED NOT DETECTED Final    Comment: (NOTE) The GeneXpert MRSA Assay (FDA approved for NASAL specimens only), is one component of a comprehensive MRSA colonization surveillance program. It is not intended to diagnose MRSA infection nor to guide or monitor treatment for MRSA infections. Test performance is not FDA approved in patients less than 39 years old. Performed at [facility], [place] [address] [place], [place] [address]     Labs: CBC: Recent Labs  Lab 06/27/22 0354 06/28/22 0326 06/29/22 0251 06/30/22 0336 07/01/22 0151  WBC 19.9* 12.3* 10.6* 9.5 9.1  NEUTROABS 18.1* 8.5* 6.6 6.2 5.1  HGB 12.4 13.1 10.8* 11.0* 11.6*  HCT 36.4 40.5 33.5* 35.2* 37.9  MCV 90.3 92.9 94.6 96.4 98.4  PLT 377 259 230 230 [id number]   Basic Metabolic Panel: Recent Labs  Lab 06/25/22 1038 06/25/22 1104 06/27/22 0354 06/28/22 0326 06/29/22 0251 06/30/22 0336 07/01/22 0151  NA 138   < > 142 145 140 137 141  K 3.0*   < > 3.3* 3.7 3.0* 3.4* 3.7  CL 104   < > 111 106 103 102 105  CO2 18*   < > 19* '27 26 28 27  '$ GLUCOSE 137*   < > 166* 88 85 95 97  BUN 9   < > <5* <5* <5* <5* <5*  CREATININE 1.09*   < > 0.81 0.71 0.67 0.67 0.66  CALCIUM 8.8*   < > 8.3* 8.6* 8.3* 8.0* 8.0*  MG 1.3*  --   --  1.6* 1.7 2.0 1.8   < > = values in this interval not displayed.   Liver Function Tests: Recent Labs  Lab 06/27/22 0354 06/28/22 0326 06/29/22 0251 06/30/22 0336 07/01/22 0151  AST 47* 42* 65* 42* 43*  ALT 24 26 34 30 30  ALKPHOS 83 73 71 66 66  BILITOT 0.6 0.5 0.6 0.5 0.3  PROT 6.3* 6.3* 5.7* 5.6* 5.4*  ALBUMIN 2.6* 2.9* 2.5* 2.6* 2.3*   CBG: Recent Labs  Lab 06/30/22 0739 06/30/22 1135  06/30/22 1704 07/01/22 0629 07/01/22 0745  GLUCAP 91 94 132* 102* 107*    Discharge time spent: greater than 30 minutes.  Signed: Vernelle Emerald, MD [facility] 07/01/2022

## 2022-07-01 NOTE — TOC Transition Note (Signed)
Transition of Care [facility]) - CM/SW Discharge Note   Patient Details  Name: [name] MRN: [id number] Date of Birth: August 15, 1970  Transition of Care [facility]) CM/SW Contact:  Rodney Booze, LCSW Phone Number: 07/01/2022, 1:24 PM   Clinical Narrative:    CSW reached out to [facility] to let agency know the patient will DC today. At this time there were no other TOC needs.    Final next level of care: [place] Barriers to Discharge: Continued Medical Work up   Patient Goals and CMS Choice      Discharge Placement                         Discharge Plan and Services Additional resources added to the After Visit Summary for                                       Social Determinants of Health (SDOH) Interventions SDOH Screenings   Food Insecurity: No Food Insecurity (06/25/2022)  Housing: Low Risk  (06/25/2022)  Transportation Needs: No Transportation Needs (06/25/2022)  Utilities: Not At Risk (06/25/2022)  Depression (PHQ2-9): Low Risk  (08/16/2020)  Tobacco Use: Low Risk  (06/25/2022)     Readmission Risk Interventions    06/29/2022   12:45 PM 05/22/2022    9:25 AM 04/27/2022    9:05 AM  Readmission Risk Prevention Plan  Transportation Screening Complete Complete Complete  Medication Review Press photographer) Complete Complete Complete  PCP or Specialist appointment within 3-5 days of discharge Complete Complete Complete  HRI or Home Care Consult Complete Complete Complete  SW Recovery Care/Counseling Consult Complete Complete Complete  Palliative Care Screening Not Applicable Not Applicable Not [place] Not Applicable Not Applicable Not Applicable

## 2022-07-01 NOTE — Discharge Instructions (Addendum)
Please take all prescribed medications exactly as instructed including the remaining course of your antibiotic therapy. You have additionally been started any new medication called metoprolol.  This helps to regulate your heart rate.  Please take this daily as instructed. Please follow-up with your primary care provider in 1 to 2 weeks. Concerning your back pain and compression fracture, interventional radiology will be contacting you with an appointment on Monday, 3/11 with a date and time to return back to [facility] to get this procedure done (a Kyphoplasty or Vertebroplasty). You may consume a low carbohydrate low sodium diet Please wear your TLSO back brace is much as tolerated.  This will help to prevent development of an abnormal curvature of the spine as your back heals. Please return to the emergency department if you develop worsening pain, weakness, fevers or inability to tolerate oral intake.

## 2022-07-01 NOTE — Progress Notes (Addendum)
Patient ID: [name], female   DOB: 05/04/1970, 52 y.o.   MRN: [id number] Pt more alert/awake today; afebrile, HR 94 O2 SATS 100% RA; WBC nl, hgb stable, creat nl; blood cx neg; f/u urine cx pend; cont to have low back pain with some radiation down LLE; briefly discussed L4 KP procedure with pt and she seems interested in pursuing. If urine cx returns neg could try and perform procedure next week schedule permitting; pt will likely need transfer to [facility] for procedure as Dr. Debbrah Alar has reviewed case and is based at [facility]. Will await f/u urine cx results. Also on lovenox which would need to be held 24 hrs before procedure.

## 2022-07-05 ENCOUNTER — Other Ambulatory Visit (HOSPITAL_COMMUNITY): Payer: Self-pay | Admitting: Neuroradiology

## 2022-07-05 DIAGNOSIS — S32040S Wedge compression fracture of fourth lumbar vertebra, sequela: Secondary | ICD-10-CM

## 2022-07-11 ENCOUNTER — Encounter (HOSPITAL_COMMUNITY): Payer: Self-pay | Admitting: Emergency Medicine

## 2022-07-11 ENCOUNTER — Other Ambulatory Visit: Payer: Self-pay

## 2022-07-11 ENCOUNTER — Inpatient Hospital Stay (HOSPITAL_COMMUNITY)
Admission: EM | Admit: 2022-07-11 | Discharge: 2022-07-14 | DRG: 074 | Disposition: A | Discharge status: Home Health | Payer: Medicare Other | Attending: Family Medicine | Admitting: Family Medicine

## 2022-07-11 DIAGNOSIS — F319 Bipolar disorder, unspecified: Secondary | ICD-10-CM | POA: Diagnosis present

## 2022-07-11 DIAGNOSIS — F4310 Post-traumatic stress disorder, unspecified: Secondary | ICD-10-CM | POA: Diagnosis present

## 2022-07-11 DIAGNOSIS — I4711 Inappropriate sinus tachycardia, so stated: Secondary | ICD-10-CM | POA: Diagnosis present

## 2022-07-11 DIAGNOSIS — Z79899 Other long term (current) drug therapy: Secondary | ICD-10-CM

## 2022-07-11 DIAGNOSIS — E669 Obesity, unspecified: Secondary | ICD-10-CM | POA: Diagnosis present

## 2022-07-11 DIAGNOSIS — R1110 Vomiting, unspecified: Secondary | ICD-10-CM | POA: Diagnosis not present

## 2022-07-11 DIAGNOSIS — R1115 Cyclical vomiting syndrome unrelated to migraine: Secondary | ICD-10-CM | POA: Diagnosis present

## 2022-07-11 DIAGNOSIS — Z6831 Body mass index (BMI) 31.0-31.9, adult: Secondary | ICD-10-CM

## 2022-07-11 DIAGNOSIS — E039 Hypothyroidism, unspecified: Secondary | ICD-10-CM | POA: Diagnosis present

## 2022-07-11 DIAGNOSIS — K219 Gastro-esophageal reflux disease without esophagitis: Secondary | ICD-10-CM | POA: Diagnosis present

## 2022-07-11 DIAGNOSIS — E1169 Type 2 diabetes mellitus with other specified complication: Secondary | ICD-10-CM | POA: Diagnosis not present

## 2022-07-11 DIAGNOSIS — E782 Mixed hyperlipidemia: Secondary | ICD-10-CM

## 2022-07-11 DIAGNOSIS — Z8674 Personal history of sudden cardiac arrest: Secondary | ICD-10-CM

## 2022-07-11 DIAGNOSIS — K2270 Barrett's esophagus without dysplasia: Secondary | ICD-10-CM | POA: Diagnosis present

## 2022-07-11 DIAGNOSIS — E872 Acidosis, unspecified: Secondary | ICD-10-CM | POA: Diagnosis not present

## 2022-07-11 DIAGNOSIS — F418 Other specified anxiety disorders: Secondary | ICD-10-CM | POA: Diagnosis present

## 2022-07-11 DIAGNOSIS — E1143 Type 2 diabetes mellitus with diabetic autonomic (poly)neuropathy: Principal | ICD-10-CM | POA: Diagnosis present

## 2022-07-11 DIAGNOSIS — R001 Bradycardia, unspecified: Secondary | ICD-10-CM | POA: Diagnosis present

## 2022-07-11 DIAGNOSIS — I252 Old myocardial infarction: Secondary | ICD-10-CM

## 2022-07-11 DIAGNOSIS — G931 Anoxic brain damage, not elsewhere classified: Secondary | ICD-10-CM | POA: Diagnosis present

## 2022-07-11 DIAGNOSIS — E860 Dehydration: Secondary | ICD-10-CM | POA: Diagnosis present

## 2022-07-11 DIAGNOSIS — K3184 Gastroparesis: Secondary | ICD-10-CM | POA: Diagnosis present

## 2022-07-11 DIAGNOSIS — F419 Anxiety disorder, unspecified: Secondary | ICD-10-CM | POA: Diagnosis present

## 2022-07-11 DIAGNOSIS — I10 Essential (primary) hypertension: Secondary | ICD-10-CM | POA: Diagnosis present

## 2022-07-11 DIAGNOSIS — Z7984 Long term (current) use of oral hypoglycemic drugs: Secondary | ICD-10-CM

## 2022-07-11 DIAGNOSIS — Z85828 Personal history of other malignant neoplasm of skin: Secondary | ICD-10-CM

## 2022-07-11 DIAGNOSIS — F1193 Opioid use, unspecified with withdrawal: Secondary | ICD-10-CM | POA: Diagnosis present

## 2022-07-11 DIAGNOSIS — M4856XA Collapsed vertebra, not elsewhere classified, lumbar region, initial encounter for fracture: Secondary | ICD-10-CM | POA: Diagnosis present

## 2022-07-11 DIAGNOSIS — Z79890 Hormone replacement therapy (postmenopausal): Secondary | ICD-10-CM

## 2022-07-11 DIAGNOSIS — Z833 Family history of diabetes mellitus: Secondary | ICD-10-CM

## 2022-07-11 DIAGNOSIS — G894 Chronic pain syndrome: Secondary | ICD-10-CM | POA: Diagnosis present

## 2022-07-11 DIAGNOSIS — M5030 Other cervical disc degeneration, unspecified cervical region: Secondary | ICD-10-CM | POA: Diagnosis present

## 2022-07-11 DIAGNOSIS — M159 Polyosteoarthritis, unspecified: Secondary | ICD-10-CM | POA: Diagnosis present

## 2022-07-11 LAB — LACTIC ACID, PLASMA: Lactic Acid, Venous: 2.8 mmol/L (ref 0.5–1.9)

## 2022-07-11 LAB — COMPREHENSIVE METABOLIC PANEL
ALT: 23 U/L (ref 0–44)
AST: 31 U/L (ref 15–41)
Albumin: 3 g/dL — ABNORMAL LOW (ref 3.5–5.0)
Alkaline Phosphatase: 138 U/L — ABNORMAL HIGH (ref 38–126)
Anion gap: 13 (ref 5–15)
BUN: <5 mg/dL — ABNORMAL LOW (ref 6–20)
CO2: 21 mmol/L — ABNORMAL LOW (ref 22–32)
Calcium: 8.8 mg/dL — ABNORMAL LOW (ref 8.9–10.3)
Chloride: 107 mmol/L (ref 98–111)
Creatinine, Ser: 0.92 mg/dL (ref 0.44–1.00)
GFR, Estimated: >60 mL/min (ref >60)
Glucose, Bld: 107 mg/dL — ABNORMAL HIGH (ref 70–99)
Potassium: 4.1 mmol/L (ref 3.5–5.1)
Sodium: 141 mmol/L (ref 135–145)
Total Bilirubin: 0.8 mg/dL (ref 0.3–1.2)
Total Protein: 7.1 g/dL (ref 6.5–8.1)

## 2022-07-11 LAB — CBC WITH DIFFERENTIAL/PLATELET
Abs Immature Granulocytes: 0.03 10*3/uL (ref 0.00–0.07)
Basophils Absolute: 0.1 10*3/uL (ref 0.0–0.1)
Basophils Relative: 1 %
Eosinophils Absolute: 0 10*3/uL (ref 0.0–0.5)
Eosinophils Relative: 0 %
HCT: 39.7 % (ref 36.0–46.0)
Hemoglobin: 12.8 g/dL (ref 12.0–15.0)
Immature Granulocytes: 0 %
Lymphocytes Relative: 10 %
Lymphs Abs: 0.9 10*3/uL (ref 0.7–4.0)
MCH: 30.9 pg (ref 26.0–34.0)
MCHC: 32.2 g/dL (ref 30.0–36.0)
MCV: 95.9 fL (ref 80.0–100.0)
Monocytes Absolute: 0.6 10*3/uL (ref 0.1–1.0)
Monocytes Relative: 7 %
Neutro Abs: 7 10*3/uL (ref 1.7–7.7)
Neutrophils Relative %: 82 %
Platelets: 356 10*3/uL (ref 150–400)
RBC: 4.14 MIL/uL (ref 3.87–5.11)
RDW: 17 % — ABNORMAL HIGH (ref 11.5–15.5)
WBC: 8.5 10*3/uL (ref 4.0–10.5)
nRBC: 0 % (ref 0.0–0.2)

## 2022-07-11 LAB — LIPASE, BLOOD: Lipase: 19 U/L (ref 11–51)

## 2022-07-11 MED ORDER — SODIUM CHLORIDE 0.9 % IV SOLN
12.5000 mg | Freq: Four times a day (QID) | INTRAVENOUS | Status: DC
  Administered 2022-07-12 (×2): 12.5 mg via INTRAVENOUS
  Filled 2022-07-11: qty 12.5
  Filled 2022-07-11: qty 0.5
  Filled 2022-07-11: qty 12.5

## 2022-07-11 MED ORDER — MORPHINE SULFATE (PF) 4 MG/ML IV SOLN
4.0000 mg | Freq: Once | INTRAVENOUS | Status: AC
  Administered 2022-07-11: 4 mg via INTRAVENOUS
  Filled 2022-07-11: qty 1

## 2022-07-11 MED ORDER — METOCLOPRAMIDE HCL 5 MG/ML IJ SOLN
10.0000 mg | Freq: Once | INTRAMUSCULAR | Status: AC
  Administered 2022-07-11: 10 mg via INTRAVENOUS
  Filled 2022-07-11: qty 2

## 2022-07-11 MED ORDER — OXYCODONE-ACETAMINOPHEN 5-325 MG PO TABS
1.0000 | ORAL_TABLET | Freq: Once | ORAL | Status: DC
  Filled 2022-07-11: qty 1

## 2022-07-11 MED ORDER — SODIUM CHLORIDE 0.9 % IV BOLUS
1000.0000 mL | Freq: Once | INTRAVENOUS | Status: AC
  Administered 2022-07-11: 1000 mL via INTRAVENOUS

## 2022-07-11 MED ORDER — ONDANSETRON HCL 4 MG/2ML IJ SOLN: 4.0000 mg | Freq: Four times a day (QID) | INTRAMUSCULAR | Status: DC | PRN

## 2022-07-11 MED ORDER — METOCLOPRAMIDE HCL 5 MG/ML IJ SOLN
10.0000 mg | Freq: Four times a day (QID) | INTRAMUSCULAR | Status: DC
  Administered 2022-07-12 – 2022-07-13 (×6): 10 mg via INTRAVENOUS
  Filled 2022-07-11 (×6): qty 2

## 2022-07-11 MED ORDER — METOCLOPRAMIDE HCL 5 MG/ML IJ SOLN: 10.0000 mg | Freq: Four times a day (QID) | INTRAMUSCULAR | Status: DC

## 2022-07-11 MED ORDER — INSULIN ASPART 100 UNIT/ML IJ SOLN
0.0000 [IU] | INTRAMUSCULAR | Status: DC
  Administered 2022-07-12: 2 [IU] via SUBCUTANEOUS
  Administered 2022-07-13: 3 [IU] via SUBCUTANEOUS
  Administered 2022-07-14: 2 [IU] via SUBCUTANEOUS
  Filled 2022-07-11: qty 0.15

## 2022-07-11 MED ORDER — LORAZEPAM 2 MG/ML IJ SOLN
0.5000 mg | Freq: Once | INTRAMUSCULAR | Status: AC
  Administered 2022-07-12: 0.5 mg via INTRAVENOUS
  Filled 2022-07-11: qty 1

## 2022-07-11 MED ORDER — ONDANSETRON HCL 4 MG/2ML IJ SOLN
4.0000 mg | Freq: Once | INTRAMUSCULAR | Status: AC
  Administered 2022-07-11: 4 mg via INTRAVENOUS
  Filled 2022-07-11: qty 2

## 2022-07-11 NOTE — H&P (Signed)
PCP:   Ferd Hibbs, NP   Chief Complaint:  Nausea, vomiting  HPI: [name] with PMHx significant for diabetes mellitus type 2, hypothyroidism, bipolar disorder, PTSD, cyclical vomiting syndrome, HTN, prior history of cardiac arrest with MI in 06/2020 which led to anoxic brain injury (believed secondary to opiate overdose in February, UDS was positive for amphetamines).  Patient is a history of cyclic vomiting and has multiple readmissions for this.  Today patient presents with complaints of the same.  She states she has had nausea and vomiting since yesterday, lightheadedness and dizziness.  She denies any significant abdominal pain.  She denies fevers or chills.  She presents to the ER.  In the ER, patient has been given 1 L NS bolus, IV Zofran, IV Reglan, IV morphine, and IV Ativan.  Morphine given as coverage for possible withdrawal, patient maintained on p.o. narcotics.  Patient continues to have bilious emesis in the ER.  Admission requested.  Review of Systems:  The patient denies anorexia, fever, weight loss,, vision loss, decreased hearing, hoarseness, chest pain, syncope, dyspnea on exertion, peripheral edema, balance deficits, hemoptysis, abdominal pain, melena, hematochezia, severe indigestion/heartburn, hematuria, incontinence, genital sores, muscle weakness, suspicious skin lesions, transient blindness, difficulty walking, depression, unusual weight change, abnormal bleeding, enlarged lymph nodes, angioedema, and breast masses. Positive: Nausea, vomiting, lightheadedness and dizziness.  Past Medical History: Past Medical History:  Diagnosis Date   Anxiety    Arthritis    "joints ache all over" (10/15/2014)   Barrett's esophagus    Bulging lumbar disc    Chronic lower back pain    DDD (degenerative disc disease), cervical    Depression    DM (diabetes mellitus) ([place])    Drug-seeking behavior    Headache    "weekly" (10/15/2014)   Hyperlipemia     Hypertension    PTSD (post-traumatic stress disorder)    Skin cancer    "had them cut off my arms; don't know what kind"   Past Surgical History:  Procedure Laterality Date   ABLATION ON ENDOMETRIOSIS  2008   BIOPSY  12/27/2018   Procedure: BIOPSY;  Surgeon: Thornton Park, MD;  Location: [facility] ENDOSCOPY;  Service: Gastroenterology;;   BIOPSY  10/05/2021   Procedure: BIOPSY;  Surgeon: Gatha Mayer, MD;  Location: [facility] ENDOSCOPY;  Service: Gastroenterology;;   ESOPHAGOGASTRODUODENOSCOPY (EGD) WITH PROPOFOL N/A 12/27/2018   Procedure: ESOPHAGOGASTRODUODENOSCOPY (EGD) WITH PROPOFOL;  Surgeon: Thornton Park, MD;  Location: [facility] ENDOSCOPY;  Service: Gastroenterology;  Laterality: N/A;   ESOPHAGOGASTRODUODENOSCOPY (EGD) WITH PROPOFOL N/A 10/05/2021   Procedure: ESOPHAGOGASTRODUODENOSCOPY (EGD) WITH PROPOFOL;  Surgeon: Gatha Mayer, MD;  Location: [place];  Service: Gastroenterology;  Laterality: N/A;   HEMORRHOID SURGERY  ~ 2002   IR FLUORO GUIDE CV LINE RIGHT  06/14/2020   IR REMOVAL TUN CV CATH W/O FL  06/23/2020   IR US GUIDE VASC ACCESS RIGHT  06/14/2020   ORIF ANKLE FRACTURE Right 03/28/2020   Procedure: OPEN REDUCTION INTERNAL FIXATION (ORIF) RIGHT BIMALLEOLAR ANKLE FRACTURE;  Surgeon: Marchia Bond, MD;  Location: [place];  Service: Orthopedics;  Laterality: Right;    Medications: Prior to Admission medications   Medication Sig Start Date End Date Taking? Authorizing Provider  acetaminophen (TYLENOL) 500 MG tablet Take 1,000 mg by mouth as needed for mild pain or headache.    [provider]  ARIPiprazole (ABILIFY) 5 MG tablet Take 1 tablet (5 mg total) by mouth daily. 08/01/20   Jamse Arn, MD  atorvastatin (LIPITOR) 10 MG tablet Take 10 mg by mouth every evening. 08/26/20   [provider]  FLUoxetine (PROZAC) 40 MG capsule Take 1 capsule (40 mg total) by mouth daily. 07/01/20   Angiulli, Lavon Paganini, PA-C  ibuprofen (ADVIL) 200 MG tablet Take  400 mg by mouth every 6 (six) hours as needed for moderate pain.    [provider]  levothyroxine (SYNTHROID) 50 MCG tablet Take 50 mcg by mouth daily before breakfast.    [provider]  lidocaine (LIDODERM) 5 % Place 1 patch onto the skin daily. Remove & Discard patch within 12 hours or as directed by MD 07/01/22   Shalhoub, Sherryll Burger, MD  metFORMIN (GLUCOPHAGE) 500 MG tablet Take 500 mg by mouth 2 (two) times daily with a meal.    [provider]  metoprolol succinate (TOPROL-XL) 100 MG 24 hr tablet Take 1 tablet (100 mg total) by mouth daily. Take with or immediately following a meal. 07/02/22   Shalhoub, Sherryll Burger, MD  multivitamin (RENA-VIT) TABS tablet Take 1 tablet by mouth at bedtime. 08/01/20   Jamse Arn, MD  ondansetron (ZOFRAN-ODT) 8 MG disintegrating tablet Take 1 tablet (8 mg total) by mouth every 8 (eight) hours as needed for nausea or vomiting. Patient taking differently: Take 8 mg by mouth as needed for nausea or vomiting. 05/03/22   Thornton Park, MD  OVER THE COUNTER MEDICATION Apply 1 Application topically as needed (itching). OTC Anti itch cream    [provider]  oxyCODONE-acetaminophen (PERCOCET) 7.5-325 MG tablet Take 1 tablet by mouth 4 (four) times daily.    [provider]  pantoprazole (PROTONIX) 40 MG tablet Take 1 tablet (40 mg total) by mouth 2 (two) times daily. 05/03/22 05/03/23  Thornton Park, MD  prazosin (MINIPRESS) 2 MG capsule Take 1 capsule (2 mg total) by mouth at bedtime. 08/01/20   Jamse Arn, MD  promethazine (PHENERGAN) 25 MG suppository Place 1 suppository (25 mg total) rectally every 6 (six) hours as needed for nausea or vomiting. Patient taking differently: Place 25 mg rectally 2 (two) times daily as needed for nausea or vomiting. 12/27/21   Drenda Freeze, MD  promethazine (PHENERGAN) 25 MG tablet Take 25 mg by mouth as needed for nausea or vomiting.    [provider]  tiZANidine  (ZANAFLEX) 4 MG tablet Take 3 tablets (12 mg total) by mouth every 8 (eight) hours as needed for up to 12 doses for muscle spasms. Patient taking differently: Take 12 mg by mouth 4 (four) times daily as needed for muscle spasms. 04/27/22   Antonieta Pert, MD  traZODone (DESYREL) 50 MG tablet Take 3 tablets (150 mg total) by mouth at bedtime. 09/21/21   Elodia Florence., MD    Allergies:  No Known Allergies  Social History:  reports that she has never smoked. She has never used smokeless tobacco. She reports that she does not currently use alcohol. She reports that she does not use drugs.  Family History: Family History  Problem Relation Age of Onset   Breast cancer Mother    Diabetes Mother    Breast cancer Maternal Grandmother    Breast cancer Paternal Grandmother    Colon polyps Paternal Grandmother    Colon cancer Neg Hx    Esophageal cancer Neg Hx    Rectal cancer Neg Hx    Stomach cancer Neg Hx     Physical Exam: Vitals:   07/11/22 2100 07/11/22 2145 07/11/22 2200  07/11/22 2300  BP:  (!) 181/104 (!) 160/131 (!) 152/103  Pulse:  95 (!) 109 (!) 104  Resp:  20 (!) 25 11  Temp: 98.1 F (36.7 C) 99.2 F (37.3 C) 100.1 F (37.8 C)   TempSrc:  Oral Oral   SpO2:  99% 99% 99%  Weight:      Height:        General:  Alert and oriented times three, well developed and nourished, Ill appearing, uncomfortable, nauseous female, mildly flushed Eyes: PERRLA, pink conjunctiva, no scleral icterus ENT: Moist oral mucosa, neck supple, no thyromegaly, moist/dry oral mucosa Lungs: clear to ascultation, no wheeze, no crackles, no use of accessory muscles Cardiovascular: regular rate and rhythm, no regurgitation, no gallops, no murmurs. No carotid bruits, no JVD Abdomen: soft, positive BS, non-tender, non-distended, no organomegaly, not an acute abdomen GU: not examined Neuro: CN II - XII grossly intact, sensation intact Musculoskeletal: strength 5/5 all extremities, no clubbing, cyanosis  or edema Skin: no rash, no subcutaneous crepitation, no decubitus Psych: appropriate patient   Labs on Admission:  Recent Labs    07/11/22 2040  NA 141  K 4.1  CL 107  CO2 21*  GLUCOSE 107*  BUN <5*  CREATININE 0.92  CALCIUM 8.8*   Recent Labs    07/11/22 2040  AST 31  ALT 23  ALKPHOS 138*  BILITOT 0.8  PROT 7.1  ALBUMIN 3.0*   Recent Labs    07/11/22 2040  LIPASE 19   Recent Labs    07/11/22 2040  WBC 8.5  NEUTROABS 7.0  HGB 12.8  HCT 39.7  MCV 95.9  PLT 356     Radiological Exams on Admission: No results found.  Assessment/Plan Present on Admission:  Cyclical vomiting with nausea/ diabetic gastroparesis -Scheduled reglan and phenergan.  Per patient Reglan and Phenergan has the greatest effect. -PRN zofran  -IV fluid rehydration -Continue as needed IV morphine.  Patient last took her oxycodone yesterday. -Clear liquid diet, patient to advance as tolerated   Lactic acidosis -Secondary to dehydration from nausea and vomiting. -For fluid hydration.  Follow lactic acid troponin normalized. -No evidence of infection.   Type 2 diabetes mellitus (HCC) -Sliding scale insulin ordered   Bipolar affective disorder (HCC)/ PTSD (post-traumatic stress disorder) -Prozac,and Abilify resumed   Essential hypertension -Metoprolol, Norvasc resumed   Inappropriate sinus tachycardia -Metoprolol resumed.  GERD -Continue twice daily Protonix IV   Hypothyroidism -Synthroid resumed   Mixed diabetic hyperlipidemia -Lipitor resumed  Chronic pain syndrome -Zanaflex resumed -PRN Morphine -Resume Percocet when patient's p.o. intake improved   h/o Anoxic brain damage ([place])  [name] 07/11/2022, 11:17 PM

## 2022-07-11 NOTE — ED Provider Triage Note (Signed)
Emergency Medicine Provider Triage Evaluation Note  [name] , a 52 y.o. female  was evaluated in triage.  Pt complains of nausea, vomiting which began today.  Was given a Zofran shot while at the PCPs office?  And reports no improvement in symptoms.  No villous, nonbloody emesis.  Similar episodes in the past.  Endorsing some right lower quadrant abdominal pain.  Review of Systems  Positive: Abdominal pain, nausea, vomiting Negative: fever  Physical Exam  BP (!) 154/120   Pulse (!) 110   Temp 98.1 F (36.7 C) (Oral)   Resp 17   LMP 06/03/2018 (Approximate) Comment: neg hcg 05/10/20  SpO2 94%  Gen:   Awake, no distress   Resp:  Normal effort  MSK:   Moves extremities without difficulty  Other:    Medical Decision Making  Medically screening exam initiated at 5:32 PM.  Appropriate orders placed.  [name] was informed that the remainder of the evaluation will be completed by another provider, this initial triage assessment does not replace that evaluation, and the importance of remaining in the ED until their evaluation is complete.     [name], PA-C 07/11/22 1734

## 2022-07-11 NOTE — ED Notes (Signed)
Pt reminded that urine sample is needed.  She is still on purewick

## 2022-07-11 NOTE — ED Provider Notes (Signed)
[facility] Provider Note   CSN: [id number] Arrival date & time: 07/11/22  1711     History  Chief Complaint  Patient presents with   Emesis    [name] is a 52 y.o. female.  52 year old female with prior medical history as detailed below presents for evaluation.  Patient with longstanding history of intermittent vomiting.  Patient with onset of recurrent vomiting today.  Patient apparently did not improve after use of antiemetics at home.  Patient denies recent fever or other illness.  Prior medical history is significant for cardiac arrest [id number], cyclical vomiting syndrome, obesity, hypertension, diabetes mellitus, chronic low back pain, degenerative disc disease, anxiety disorder.  Last admission was earlier this month.         Home Medications Prior to Admission medications   Medication Sig Start Date End Date Taking? Authorizing Provider  acetaminophen (TYLENOL) 500 MG tablet Take 1,000 mg by mouth as needed for mild pain or headache.    [provider]  ARIPiprazole (ABILIFY) 5 MG tablet Take 1 tablet (5 mg total) by mouth daily. 08/01/20   Jamse Arn, MD  atorvastatin (LIPITOR) 10 MG tablet Take 10 mg by mouth every evening. 08/26/20   [provider]  FLUoxetine (PROZAC) 40 MG capsule Take 1 capsule (40 mg total) by mouth daily. 07/01/20   Angiulli, Lavon Paganini, PA-C  ibuprofen (ADVIL) 200 MG tablet Take 400 mg by mouth every 6 (six) hours as needed for moderate pain.    [provider]  levothyroxine (SYNTHROID) 50 MCG tablet Take 50 mcg by mouth daily before breakfast.    [provider]  lidocaine (LIDODERM) 5 % Place 1 patch onto the skin daily. Remove & Discard patch within 12 hours or as directed by MD 07/01/22   Shalhoub, Sherryll Burger, MD  metFORMIN (GLUCOPHAGE) 500 MG tablet Take 500 mg by mouth 2 (two) times daily with a meal.    [provider]  metoprolol  succinate (TOPROL-XL) 100 MG 24 hr tablet Take 1 tablet (100 mg total) by mouth daily. Take with or immediately following a meal. 07/02/22   Shalhoub, Sherryll Burger, MD  multivitamin (RENA-VIT) TABS tablet Take 1 tablet by mouth at bedtime. 08/01/20   Jamse Arn, MD  ondansetron (ZOFRAN-ODT) 8 MG disintegrating tablet Take 1 tablet (8 mg total) by mouth every 8 (eight) hours as needed for nausea or vomiting. Patient taking differently: Take 8 mg by mouth as needed for nausea or vomiting. 05/03/22   Thornton Park, MD  OVER THE COUNTER MEDICATION Apply 1 Application topically as needed (itching). OTC Anti itch cream    [provider]  oxyCODONE-acetaminophen (PERCOCET) 7.5-325 MG tablet Take 1 tablet by mouth 4 (four) times daily.    [provider]  pantoprazole (PROTONIX) 40 MG tablet Take 1 tablet (40 mg total) by mouth 2 (two) times daily. 05/03/22 05/03/23  Thornton Park, MD  prazosin (MINIPRESS) 2 MG capsule Take 1 capsule (2 mg total) by mouth at bedtime. 08/01/20   Jamse Arn, MD  promethazine (PHENERGAN) 25 MG suppository Place 1 suppository (25 mg total) rectally every 6 (six) hours as needed for nausea or vomiting. Patient taking differently: Place 25 mg rectally 2 (two) times daily as needed for nausea or vomiting. 12/27/21   Drenda Freeze, MD  promethazine (PHENERGAN) 25 MG tablet Take 25 mg by mouth as needed for nausea or vomiting.    [provider]  tiZANidine (ZANAFLEX) 4 MG tablet Take 3 tablets (12 mg total) by mouth every 8 (eight) hours as needed for up to 12 doses for muscle spasms. Patient taking differently: Take 12 mg by mouth 4 (four) times daily as needed for muscle spasms. 04/27/22   Antonieta Pert, MD  traZODone (DESYREL) 50 MG tablet Take 3 tablets (150 mg total) by mouth at bedtime. 09/21/21   Elodia Florence., MD      Allergies    Patient has no known allergies.    Review of Systems   Review of Systems  All other systems  reviewed and are negative.   Physical Exam Updated Vital Signs BP (!) 181/104   Pulse 95   Temp 99.2 F (37.3 C) (Oral)   Resp 20   Ht 5\' 4"  (1.626 m)   Wt 83.9 kg   LMP 06/03/2018 (Approximate) Comment: neg hcg 05/10/20  SpO2 99%   BMI 31.76 kg/m  Physical Exam Vitals and nursing note reviewed.  Constitutional:      General: She is not in acute distress.    Appearance: Normal appearance. She is well-developed.  HENT:     Head: Normocephalic and atraumatic.  Eyes:     Conjunctiva/sclera: Conjunctivae normal.     Pupils: Pupils are equal, round, and reactive to light.  Cardiovascular:     Rate and Rhythm: Normal rate and regular rhythm.     Heart sounds: Normal heart sounds.  Pulmonary:     Effort: Pulmonary effort is normal. No respiratory distress.     Breath sounds: Normal breath sounds.  Abdominal:     General: There is no distension.     Palpations: Abdomen is soft.     Tenderness: There is no abdominal tenderness.  Musculoskeletal:        General: No deformity. Normal range of motion.     Cervical back: Normal range of motion and neck supple.  Skin:    General: Skin is warm and dry.  Neurological:     General: No focal deficit present.     Mental Status: She is alert and oriented to person, place, and time.     ED Results / Procedures / Treatments   Labs (all labs ordered are listed, but only abnormal results are displayed) Labs Reviewed  CBC WITH DIFFERENTIAL/PLATELET - Abnormal; Notable for the following components:      Result Value   RDW 17.0 (*)    All other components within normal limits  COMPREHENSIVE METABOLIC PANEL - Abnormal; Notable for the following components:   CO2 21 (*)    Glucose, Bld 107 (*)    BUN <5 (*)    Calcium 8.8 (*)    Albumin 3.0 (*)    Alkaline Phosphatase 138 (*)    All other components within normal limits  LACTIC ACID, PLASMA - Abnormal; Notable for the following components:   Lactic Acid, Venous 2.8 (*)    All other  components within normal limits  CULTURE, BLOOD (ROUTINE X 2)  CULTURE, BLOOD (ROUTINE X 2)  LIPASE, BLOOD  URINALYSIS, ROUTINE W REFLEX MICROSCOPIC  LACTIC ACID, PLASMA  RAPID URINE DRUG SCREEN, HOSP PERFORMED    EKG EKG Interpretation  Date/Time:  Wednesday July 11 2022 19:46:51 EDT Ventricular Rate:  99 PR Interval:  121 QRS Duration: 87 QT Interval:  362 QTC Calculation: 465 R Axis:   37 Text Interpretation: Sinus rhythm RSR' in V1 or V2, probably normal variant Confirmed by Dene Gentry [phone]) on 07/11/2022  7:58:22 PM  Radiology No results found.  Procedures Procedures    Medications Ordered in ED Medications  oxyCODONE-acetaminophen (PERCOCET/ROXICET) 5-325 MG per tablet 1 tablet (has no administration in time range)  metoCLOPramide (REGLAN) injection 10 mg (has no administration in time range)  sodium chloride 0.9 % bolus 1,000 mL (has no administration in time range)  sodium chloride 0.9 % bolus 1,000 mL (1,000 mLs Intravenous New Bag/Given 07/11/22 2141)  ondansetron (ZOFRAN) injection 4 mg (4 mg Intravenous Given 07/11/22 2141)    ED Course/ Medical Decision Making/ A&P                             Medical Decision Making Amount and/or Complexity of Data Reviewed Labs: ordered.  Risk Prescription drug management.    Medical Screen Complete  This patient presented to the ED with complaint of nausea, vomiting.  This complaint involves an extensive number of treatment options. The initial differential diagnosis includes, but is not limited to, cyclic vomiting, metabolic abnormality, dehydration, AKI, etc.  This presentation is: Acute, Chronic, Self-Limited, Previously Undiagnosed, Uncertain Prognosis, Complicated, Systemic Symptoms, and Threat to Life/Bodily Function  Patient's with known history of cyclic vomiting presents with onset of nausea and vomiting earlier today.  She denies associated fever, abdominal pain.  Screening labs obtained  remarkable for initial elevated lactic acid 2.8.  Creatinine is near baseline at 0.9.  BUN is less than 5.  Anion gap is 13.  Despite IV fluids and antiemetics the patient continues to retch/vomit.  Patient feels that her symptoms are significant enough to require admission.  Hospitalist service made aware of case and will evaluate for admission.  Additional history obtained:  External records from outside sources obtained and reviewed including prior ED visits and prior Inpatient records.    Lab Tests:  I ordered and personally interpreted labs.  The pertinent results include: CBC, CMP, lactic acid, UA, lipase,  Cardiac Monitoring:  The patient was maintained on a cardiac monitor.  I personally viewed and interpreted the cardiac monitor which showed an underlying rhythm of: NSR   Medicines ordered:  I ordered medication including morphine, reglan, zofran  for nausea/pain  Reevaluation of the patient after these medicines showed that the patient: stayed the same   Problem List / ED Course:  Intractable nausea and vomiting   Reevaluation:  After the interventions noted above, I reevaluated the patient and found that they have: stayed the same   Disposition:  After consideration of the diagnostic results and the patients response to treatment, I feel that the patent would benefit from admission.          Final Clinical Impression(s) / ED Diagnoses Final diagnoses:  Uncontrollable vomiting    Rx / DC Orders ED Discharge Orders     None         Valarie Merino, MD 07/11/22 2247

## 2022-07-11 NOTE — ED Notes (Signed)
Pt was placed on purewick and advised that urine sample is needed see chart for time

## 2022-07-11 NOTE — ED Notes (Signed)
Attempt IV placement x2 without success.  BLood other than blood cultures was drawn and sent.  Second RN at bedside for IV placement

## 2022-07-11 NOTE — ED Notes (Signed)
CRITICAL VALUE STICKER  CRITICAL VALUE: Lactic Acid 2.8  RECEIVER (on-site recipient of call): [name] NOTIFIED: 9:53 PM    MD NOTIFIED: [place]  TIME OF NOTIFICATION: 9:53 PM   RESPONSE:  see new orders

## 2022-07-11 NOTE — ED Notes (Signed)
Pt vomited dark green liquid x1.  Pt changed, EDP notified

## 2022-07-11 NOTE — ED Notes (Signed)
Pt states they have a hx of hypertension. Took their bp medication around 7-8 this morning.

## 2022-07-11 NOTE — ED Notes (Signed)
Pt usually gets ultrasound IV

## 2022-07-11 NOTE — ED Triage Notes (Signed)
Patient arrives in wheelchair by POV c/o nausea and vomiting onset of this morning. C/o RLQ pain. Reports recent fractured back.

## 2022-07-12 DIAGNOSIS — I252 Old myocardial infarction: Secondary | ICD-10-CM | POA: Diagnosis not present

## 2022-07-12 DIAGNOSIS — R1110 Vomiting, unspecified: Secondary | ICD-10-CM | POA: Diagnosis present

## 2022-07-12 DIAGNOSIS — M4856XA Collapsed vertebra, not elsewhere classified, lumbar region, initial encounter for fracture: Secondary | ICD-10-CM | POA: Diagnosis present

## 2022-07-12 DIAGNOSIS — E039 Hypothyroidism, unspecified: Secondary | ICD-10-CM | POA: Diagnosis present

## 2022-07-12 DIAGNOSIS — I10 Essential (primary) hypertension: Secondary | ICD-10-CM | POA: Diagnosis present

## 2022-07-12 DIAGNOSIS — Z6831 Body mass index (BMI) 31.0-31.9, adult: Secondary | ICD-10-CM | POA: Diagnosis not present

## 2022-07-12 DIAGNOSIS — R1115 Cyclical vomiting syndrome unrelated to migraine: Secondary | ICD-10-CM

## 2022-07-12 DIAGNOSIS — M159 Polyosteoarthritis, unspecified: Secondary | ICD-10-CM | POA: Diagnosis present

## 2022-07-12 DIAGNOSIS — E1169 Type 2 diabetes mellitus with other specified complication: Secondary | ICD-10-CM | POA: Diagnosis present

## 2022-07-12 DIAGNOSIS — M5030 Other cervical disc degeneration, unspecified cervical region: Secondary | ICD-10-CM | POA: Diagnosis present

## 2022-07-12 DIAGNOSIS — F319 Bipolar disorder, unspecified: Secondary | ICD-10-CM | POA: Diagnosis present

## 2022-07-12 DIAGNOSIS — I4711 Inappropriate sinus tachycardia, so stated: Secondary | ICD-10-CM | POA: Diagnosis present

## 2022-07-12 DIAGNOSIS — F419 Anxiety disorder, unspecified: Secondary | ICD-10-CM | POA: Diagnosis present

## 2022-07-12 DIAGNOSIS — E782 Mixed hyperlipidemia: Secondary | ICD-10-CM | POA: Diagnosis present

## 2022-07-12 DIAGNOSIS — F4310 Post-traumatic stress disorder, unspecified: Secondary | ICD-10-CM | POA: Diagnosis present

## 2022-07-12 DIAGNOSIS — K2270 Barrett's esophagus without dysplasia: Secondary | ICD-10-CM | POA: Diagnosis present

## 2022-07-12 DIAGNOSIS — K219 Gastro-esophageal reflux disease without esophagitis: Secondary | ICD-10-CM | POA: Diagnosis present

## 2022-07-12 DIAGNOSIS — Z79890 Hormone replacement therapy (postmenopausal): Secondary | ICD-10-CM | POA: Diagnosis not present

## 2022-07-12 DIAGNOSIS — E669 Obesity, unspecified: Secondary | ICD-10-CM | POA: Diagnosis present

## 2022-07-12 DIAGNOSIS — K3184 Gastroparesis: Secondary | ICD-10-CM | POA: Diagnosis present

## 2022-07-12 DIAGNOSIS — E872 Acidosis, unspecified: Secondary | ICD-10-CM | POA: Diagnosis present

## 2022-07-12 DIAGNOSIS — E1143 Type 2 diabetes mellitus with diabetic autonomic (poly)neuropathy: Secondary | ICD-10-CM | POA: Diagnosis present

## 2022-07-12 DIAGNOSIS — R001 Bradycardia, unspecified: Secondary | ICD-10-CM | POA: Diagnosis present

## 2022-07-12 DIAGNOSIS — E860 Dehydration: Secondary | ICD-10-CM | POA: Diagnosis present

## 2022-07-12 DIAGNOSIS — G894 Chronic pain syndrome: Secondary | ICD-10-CM | POA: Diagnosis present

## 2022-07-12 LAB — BASIC METABOLIC PANEL
Anion gap: 10 (ref 5–15)
BUN: <5 mg/dL — ABNORMAL LOW (ref 6–20)
CO2: 22 mmol/L (ref 22–32)
Calcium: 8.2 mg/dL — ABNORMAL LOW (ref 8.9–10.3)
Chloride: 110 mmol/L (ref 98–111)
Creatinine, Ser: 0.79 mg/dL (ref 0.44–1.00)
GFR, Estimated: >60 mL/min (ref >60)
Glucose, Bld: 105 mg/dL — ABNORMAL HIGH (ref 70–99)
Potassium: 3.3 mmol/L — ABNORMAL LOW (ref 3.5–5.1)
Sodium: 142 mmol/L (ref 135–145)

## 2022-07-12 LAB — CBG MONITORING, ED: Glucose-Capillary: 92 mg/dL (ref 70–99)

## 2022-07-12 LAB — GLUCOSE, CAPILLARY
Glucose-Capillary: 103 mg/dL — ABNORMAL HIGH (ref 70–99)
Glucose-Capillary: 121 mg/dL — ABNORMAL HIGH (ref 70–99)
Glucose-Capillary: 78 mg/dL (ref 70–99)
Glucose-Capillary: 83 mg/dL (ref 70–99)
Glucose-Capillary: 90 mg/dL (ref 70–99)

## 2022-07-12 LAB — CBC WITH DIFFERENTIAL/PLATELET
Abs Immature Granulocytes: 0.03 10*3/uL (ref 0.00–0.07)
Basophils Absolute: 0 10*3/uL (ref 0.0–0.1)
Basophils Relative: 0 %
Eosinophils Absolute: 0 10*3/uL (ref 0.0–0.5)
Eosinophils Relative: 0 %
HCT: 35.2 % — ABNORMAL LOW (ref 36.0–46.0)
Hemoglobin: 11.4 g/dL — ABNORMAL LOW (ref 12.0–15.0)
Immature Granulocytes: 0 %
Lymphocytes Relative: 12 %
Lymphs Abs: 1.1 10*3/uL (ref 0.7–4.0)
MCH: 30.7 pg (ref 26.0–34.0)
MCHC: 32.4 g/dL (ref 30.0–36.0)
MCV: 94.9 fL (ref 80.0–100.0)
Monocytes Absolute: 0.7 10*3/uL (ref 0.1–1.0)
Monocytes Relative: 8 %
Neutro Abs: 7.5 10*3/uL (ref 1.7–7.7)
Neutrophils Relative %: 80 %
Platelets: 375 10*3/uL (ref 150–400)
RBC: 3.71 MIL/uL — ABNORMAL LOW (ref 3.87–5.11)
RDW: 17 % — ABNORMAL HIGH (ref 11.5–15.5)
WBC: 9.5 10*3/uL (ref 4.0–10.5)
nRBC: 0 % (ref 0.0–0.2)

## 2022-07-12 LAB — MAGNESIUM: Magnesium: 1.9 mg/dL (ref 1.7–2.4)

## 2022-07-12 LAB — LACTIC ACID, PLASMA: Lactic Acid, Venous: 2.1 mmol/L (ref 0.5–1.9)

## 2022-07-12 MED ORDER — PANTOPRAZOLE SODIUM 40 MG IV SOLR
40.0000 mg | Freq: Two times a day (BID) | INTRAVENOUS | Status: DC
  Administered 2022-07-12 – 2022-07-14 (×6): 40 mg via INTRAVENOUS
  Filled 2022-07-12 (×6): qty 10

## 2022-07-12 MED ORDER — SODIUM CHLORIDE 0.9 % IV SOLN: 6.2500 mg | Freq: Four times a day (QID) | INTRAVENOUS | Status: DC | PRN

## 2022-07-12 MED ORDER — OXYCODONE-ACETAMINOPHEN 7.5-325 MG PO TABS
1.0000 | ORAL_TABLET | Freq: Four times a day (QID) | ORAL | Status: DC
  Administered 2022-07-12 – 2022-07-14 (×7): 1 via ORAL
  Filled 2022-07-12 (×7): qty 1

## 2022-07-12 MED ORDER — ORAL CARE MOUTH RINSE
15.0000 mL | OROMUCOSAL | Status: DC
  Administered 2022-07-12 – 2022-07-14 (×8): 15 mL via OROMUCOSAL

## 2022-07-12 MED ORDER — TRAZODONE HCL 50 MG PO TABS
150.0000 mg | ORAL_TABLET | Freq: Every day | ORAL | Status: DC
  Administered 2022-07-13: 150 mg via ORAL
  Filled 2022-07-12 (×2): qty 3

## 2022-07-12 MED ORDER — TIZANIDINE HCL 4 MG PO TABS
12.0000 mg | ORAL_TABLET | Freq: Three times a day (TID) | ORAL | Status: DC | PRN
  Administered 2022-07-12 – 2022-07-14 (×5): 12 mg via ORAL
  Filled 2022-07-12 (×5): qty 3

## 2022-07-12 MED ORDER — PROMETHAZINE HCL 25 MG RE SUPP: 25.0000 mg | Freq: Four times a day (QID) | RECTAL | Status: DC | PRN

## 2022-07-12 MED ORDER — LACTATED RINGERS IV SOLN: INTRAVENOUS | Status: DC

## 2022-07-12 MED ORDER — METOPROLOL SUCCINATE ER 50 MG PO TB24
100.0000 mg | ORAL_TABLET | Freq: Every day | ORAL | Status: DC
  Administered 2022-07-12 – 2022-07-13 (×2): 100 mg via ORAL
  Filled 2022-07-12 (×2): qty 2

## 2022-07-12 MED ORDER — POLYETHYLENE GLYCOL 3350 17 G PO PACK
17.0000 g | PACK | Freq: Every day | ORAL | Status: DC
  Administered 2022-07-12: 17 g via ORAL
  Filled 2022-07-12: qty 1

## 2022-07-12 MED ORDER — ARIPIPRAZOLE 10 MG PO TABS
5.0000 mg | ORAL_TABLET | Freq: Every day | ORAL | Status: DC
  Administered 2022-07-12 – 2022-07-14 (×3): 5 mg via ORAL
  Filled 2022-07-12 (×3): qty 1

## 2022-07-12 MED ORDER — FLUOXETINE HCL 20 MG PO CAPS
40.0000 mg | ORAL_CAPSULE | Freq: Every day | ORAL | Status: DC
  Administered 2022-07-12 – 2022-07-14 (×3): 40 mg via ORAL
  Filled 2022-07-12 (×3): qty 2

## 2022-07-12 MED ORDER — MORPHINE SULFATE (PF) 2 MG/ML IV SOLN: 1.0000 mg | Freq: Four times a day (QID) | INTRAVENOUS | Status: DC | PRN

## 2022-07-12 MED ORDER — HYDROMORPHONE HCL 1 MG/ML IJ SOLN
1.0000 mg | INTRAMUSCULAR | Status: DC | PRN
  Administered 2022-07-12 – 2022-07-13 (×6): 1 mg via INTRAVENOUS
  Filled 2022-07-12 (×6): qty 1

## 2022-07-12 MED ORDER — POTASSIUM CHLORIDE 10 MEQ/100ML IV SOLN
10.0000 meq | INTRAVENOUS | Status: AC
  Administered 2022-07-12 (×3): 10 meq via INTRAVENOUS
  Filled 2022-07-12 (×3): qty 100

## 2022-07-12 MED ORDER — LEVOTHYROXINE SODIUM 50 MCG PO TABS
50.0000 ug | ORAL_TABLET | Freq: Every day | ORAL | Status: DC
  Administered 2022-07-13 – 2022-07-14 (×2): 50 ug via ORAL
  Filled 2022-07-12 (×2): qty 1

## 2022-07-12 MED ORDER — POLYETHYLENE GLYCOL 3350 17 G PO PACK
17.0000 g | PACK | Freq: Two times a day (BID) | ORAL | Status: DC
  Administered 2022-07-13 (×2): 17 g via ORAL
  Filled 2022-07-12 (×4): qty 1

## 2022-07-12 MED ORDER — PROMETHAZINE HCL 25 MG PO TABS: 25.0000 mg | ORAL_TABLET | Freq: Four times a day (QID) | ORAL | Status: DC | PRN

## 2022-07-12 NOTE — ED Notes (Signed)
Pt had vomit on her gown again, changed gown and pt pulled up in bed.  Pt has voided but unfortunately urine was not collected in purewick

## 2022-07-12 NOTE — ED Notes (Signed)
ED TO INPATIENT HANDOFF REPORT  ED Nurse Name and Phone #: Orpah Cobb Name/Age/Gender [name] 52 y.o. female Room/Bed: WA14/WA14  Code Status   Code Status: Prior  Home/SNF/Other Home Patient oriented to: self, place, time, and situation Is this baseline? Yes   Triage Complete: Triage complete  Chief Complaint Cyclical vomiting with nausea [R11.15]  Triage Note Patient arrives in wheelchair by POV c/o nausea and vomiting onset of this morning. C/o RLQ pain. Reports recent fractured back.    Allergies No Known Allergies  Level of Care/Admitting Diagnosis ED Disposition     ED Disposition  Admit   Condition  --   Comment  Hospital Area: [facility] [[id number]]  Level of Care: Med-Surg [16]  May admit patient to [facility] or [facility] if equivalent level of care is available:: Yes  Covid Evaluation: Confirmed COVID Negative  Diagnosis: Cyclical vomiting with nausea [id number]  Admitting Physician: [place], [place]  Attending Physician: Quintella Baton [id number]  Certification:: I certify this patient will need inpatient services for at least 2 midnights  Estimated Length of Stay: 2          B Medical/Surgery History Past Medical History:  Diagnosis Date   Anxiety    Arthritis    "joints ache all over" (10/15/2014)   Barrett's esophagus    Bulging lumbar disc    Chronic lower back pain    DDD (degenerative disc disease), cervical    Depression    DM (diabetes mellitus) ([place])    Drug-seeking behavior    Headache    "weekly" (10/15/2014)   Hyperlipemia    Hypertension    PTSD (post-traumatic stress disorder)    Skin cancer    "had them cut off my arms; don't know what kind"   Past Surgical History:  Procedure Laterality Date   ABLATION ON ENDOMETRIOSIS  2008   BIOPSY  12/27/2018   Procedure: BIOPSY;  Surgeon: Thornton Park, MD;  Location: [facility] ENDOSCOPY;  Service: Gastroenterology;;   BIOPSY  10/05/2021   Procedure:  BIOPSY;  Surgeon: Gatha Mayer, MD;  Location: [facility] ENDOSCOPY;  Service: Gastroenterology;;   ESOPHAGOGASTRODUODENOSCOPY (EGD) WITH PROPOFOL N/A 12/27/2018   Procedure: ESOPHAGOGASTRODUODENOSCOPY (EGD) WITH PROPOFOL;  Surgeon: Thornton Park, MD;  Location: [facility];  Service: Gastroenterology;  Laterality: N/A;   ESOPHAGOGASTRODUODENOSCOPY (EGD) WITH PROPOFOL N/A 10/05/2021   Procedure: ESOPHAGOGASTRODUODENOSCOPY (EGD) WITH PROPOFOL;  Surgeon: Gatha Mayer, MD;  Location: [facility];  Service: Gastroenterology;  Laterality: N/A;   HEMORRHOID SURGERY  ~ 2002   IR FLUORO GUIDE CV LINE RIGHT  06/14/2020   IR REMOVAL TUN CV CATH W/O FL  06/23/2020   IR US GUIDE VASC ACCESS RIGHT  06/14/2020   ORIF ANKLE FRACTURE Right 03/28/2020   Procedure: OPEN REDUCTION INTERNAL FIXATION (ORIF) RIGHT BIMALLEOLAR ANKLE FRACTURE;  Surgeon: Marchia Bond, MD;  Location: [facility];  Service: Orthopedics;  Laterality: Right;     A IV Location/Drains/Wounds Patient Lines/Drains/Airways Status     Active Line/Drains/Airways     Name Placement date Placement time Site Days   Peripheral IV 07/11/22 22 G 2.5" Anterior;Distal;Left;Upper Arm 07/11/22  2140  Arm  1   External Urinary Catheter 07/11/22  1955  --  1            Intake/Output Last 24 hours  Intake/Output Summary (Last 24 hours) at 07/12/2022 0433 Last data filed at 07/12/2022 0020 Gross per 24 hour  Intake 2000 ml  Output --  Net 2000 ml    Labs/Imaging Results for orders placed or performed during the hospital encounter of 07/11/22 (from the past 48 hour(s))  CBC with Differential     Status: Abnormal   Collection Time: 07/11/22  8:40 PM  Result Value Ref Range   WBC 8.5 4.0 - 10.5 K/uL   RBC 4.14 3.87 - 5.11 MIL/uL   Hemoglobin 12.8 12.0 - 15.0 g/dL   HCT 39.7 36.0 - 46.0 %   MCV 95.9 80.0 - 100.0 fL   MCH 30.9 26.0 - 34.0 pg   MCHC 32.2 30.0 - 36.0 g/dL   RDW 17.0 (H) 11.5 - 15.5 %   Platelets 356 150 - 400  K/uL   nRBC 0.0 0.0 - 0.2 %   Neutrophils Relative % 82 %   Neutro Abs 7.0 1.7 - 7.7 K/uL   Lymphocytes Relative 10 %   Lymphs Abs 0.9 0.7 - 4.0 K/uL   Monocytes Relative 7 %   Monocytes Absolute 0.6 0.1 - 1.0 K/uL   Eosinophils Relative 0 %   Eosinophils Absolute 0.0 0.0 - 0.5 K/uL   Basophils Relative 1 %   Basophils Absolute 0.1 0.0 - 0.1 K/uL   Immature Granulocytes 0 %   Abs Immature Granulocytes 0.03 0.00 - 0.07 K/uL    Comment: Performed at [facility], [place] [address], [place], [place] [address]  Comprehensive metabolic panel     Status: Abnormal   Collection Time: 07/11/22  8:40 PM  Result Value Ref Range   Sodium 141 135 - 145 mmol/L   Potassium 4.1 3.5 - 5.1 mmol/L   Chloride 107 98 - 111 mmol/L   CO2 21 (L) 22 - 32 mmol/L   Glucose, Bld 107 (H) 70 - 99 mg/dL    Comment: Glucose reference range applies only to samples taken after fasting for at least 8 hours.   BUN <5 (L) 6 - 20 mg/dL   Creatinine, Ser 0.92 0.44 - 1.00 mg/dL   Calcium 8.8 (L) 8.9 - 10.3 mg/dL   Total Protein 7.1 6.5 - 8.1 g/dL   Albumin 3.0 (L) 3.5 - 5.0 g/dL   AST 31 15 - 41 U/L   ALT 23 0 - 44 U/L   Alkaline Phosphatase 138 (H) 38 - 126 U/L   Total Bilirubin 0.8 0.3 - 1.2 mg/dL   GFR, Estimated >60 >60 mL/min    Comment: (NOTE) Calculated using the CKD-EPI Creatinine Equation (2021)    Anion gap 13 5 - 15    Comment: Performed at [facility], [place] [address], [place], [place] [address]  Lipase, blood     Status: None   Collection Time: 07/11/22  8:40 PM  Result Value Ref Range   Lipase 19 11 - 51 U/L    Comment: Performed at [facility], [place] [address] [place], [place] [address]  Lactic acid, plasma     Status: Abnormal   Collection Time: 07/11/22  8:40 PM  Result Value Ref Range   Lactic Acid, Venous 2.8 (HH) 0.5 - 1.9 mmol/L    Comment: CRITICAL RESULT CALLED TO, READ BACK BY AND VERIFIED WITH E.Trong Gosling, RN AT 2153 ON  03.20.24 BY N.THOMPSON Performed at [facility], [place] [address], [place], [place] [address]   Lactic acid, plasma     Status: Abnormal   Collection Time: 07/11/22 11:54 PM  Result Value Ref Range   Lactic Acid, Venous 2.1 (HH) 0.5 - 1.9 mmol/L  Comment: CRITICAL RESULT CALLED TO, READ BACK BY AND VERIFIED WITH FERRAINLLO,J RN @ 0103 ON [id number] BY MAHMOUD,S Performed at [facility], [place] [address] [place], [place] [address]   CBG monitoring, ED     Status: None   Collection Time: 07/12/22 12:50 AM  Result Value Ref Range   Glucose-Capillary 92 70 - 99 mg/dL    Comment: Glucose reference range applies only to samples taken after fasting for at least 8 hours.  CBC with Differential/Platelet     Status: Abnormal   Collection Time: 07/12/22  4:18 AM  Result Value Ref Range   WBC 9.5 4.0 - 10.5 K/uL   RBC 3.71 (L) 3.87 - 5.11 MIL/uL   Hemoglobin 11.4 (L) 12.0 - 15.0 g/dL   HCT 35.2 (L) 36.0 - 46.0 %   MCV 94.9 80.0 - 100.0 fL   MCH 30.7 26.0 - 34.0 pg   MCHC 32.4 30.0 - 36.0 g/dL   RDW 17.0 (H) 11.5 - 15.5 %   Platelets 375 150 - 400 K/uL   nRBC 0.0 0.0 - 0.2 %   Neutrophils Relative % 80 %   Neutro Abs 7.5 1.7 - 7.7 K/uL   Lymphocytes Relative 12 %   Lymphs Abs 1.1 0.7 - 4.0 K/uL   Monocytes Relative 8 %   Monocytes Absolute 0.7 0.1 - 1.0 K/uL   Eosinophils Relative 0 %   Eosinophils Absolute 0.0 0.0 - 0.5 K/uL   Basophils Relative 0 %   Basophils Absolute 0.0 0.0 - 0.1 K/uL   Immature Granulocytes 0 %   Abs Immature Granulocytes 0.03 0.00 - 0.07 K/uL    Comment: Performed at [facility], [place] [address], [place], [place] [address]   No results found.  Pending Labs Unresulted Labs (From admission, onward)     Start     Ordered   07/12/22 [id number]  Basic metabolic panel  Tomorrow morning,   R        07/12/22 0348   07/11/22 2313  Magnesium  Add-on,   AD        07/11/22 2313   07/11/22 1934  Urine rapid drug screen  (hosp performed)  Once,   STAT        07/11/22 1933   07/11/22 1932  Culture, blood (routine x 2)  BLOOD CULTURE X 2,   R (with STAT occurrences)      07/11/22 1931   07/11/22 1733  Urinalysis, Routine w reflex microscopic -Urine, Clean Catch  (ED Abdominal Pain)  Once,   URGENT       Question:  Specimen Source  Answer:  Urine, Clean Catch   07/11/22 1732            Vitals/Pain Today's Vitals   07/12/22 0100 07/12/22 0200 07/12/22 0346 07/12/22 0408  BP: (!) 178/102 (!) 157/111  (!) 153/90  Pulse: (!) 106 (!) 112  (!) 113  Resp: 15 16  15   Temp:   99.2 F (37.3 C) 98.1 F (36.7 C)  TempSrc:    Oral  SpO2: 97% 97%  96%  Weight:      Height:      PainSc:        Isolation Precautions No active isolations  Medications Medications  promethazine (PHENERGAN) 12.5 mg in sodium chloride 0.9 % 50 mL IVPB (0 mg Intravenous Stopped 07/12/22 0327)  metoCLOPramide (REGLAN) injection 10 mg (10 mg Intravenous Given 07/12/22 0409)  ondansetron (ZOFRAN) injection 4 mg (has no  administration in time range)  insulin aspart (novoLOG) injection 0-15 Units ( Subcutaneous Not Given 07/12/22 0409)  ARIPiprazole (ABILIFY) tablet 5 mg (has no administration in time range)  FLUoxetine (PROZAC) capsule 40 mg (has no administration in time range)  levothyroxine (SYNTHROID) tablet 50 mcg (has no administration in time range)  metoprolol succinate (TOPROL-XL) 24 hr tablet 100 mg (has no administration in time range)  traZODone (DESYREL) tablet 150 mg (0 mg Oral Hold 07/12/22 0043)  pantoprazole (PROTONIX) injection 40 mg (40 mg Intravenous Given 07/12/22 0052)  tiZANidine (ZANAFLEX) tablet 12 mg (has no administration in time range)  lactated ringers infusion ( Intravenous New Bag/Given 07/12/22 0052)  HYDROmorphone (DILAUDID) injection 1 mg (1 mg Intravenous Given 07/12/22 0406)  sodium chloride 0.9 % bolus 1,000 mL (0 mLs Intravenous Stopped 07/11/22 2253)  ondansetron (ZOFRAN) injection 4 mg (4 mg  Intravenous Given 07/11/22 2141)  metoCLOPramide (REGLAN) injection 10 mg (10 mg Intravenous Given 07/11/22 2209)  sodium chloride 0.9 % bolus 1,000 mL (0 mLs Intravenous Stopped 07/12/22 0020)  morphine (PF) 4 MG/ML injection 4 mg (4 mg Intravenous Given 07/11/22 2216)  LORazepam (ATIVAN) injection 0.5 mg (0.5 mg Intravenous Given 07/12/22 0041)    Mobility walks with device     Focused Assessments N&V   R Recommendations: See Admitting Provider Note  Report given to:   Additional Notes:

## 2022-07-12 NOTE — Progress Notes (Signed)
PROGRESS NOTE    [name]  [id number] DOB: 07-07-1970 DOA: 07/11/2022 PCP: Ferd Hibbs, NP  Chief Complaint  Patient presents with   Emesis    Brief Narrative:   This is [name] 52y/o female with PMHx significant for diabetes mellitus type 2, hypothyroidism, bipolar disorder, PTSD, cyclical vomiting syndrome, HTN, prior history of cardiac arrest with MI in 06/2020 which led to anoxic brain injury (believed secondary to opiate overdose in February, UDS was positive for amphetamines).  Patient is [name] history of cyclic vomiting and has multiple readmissions for this.  Today patient presents with complaints of the same.  She states she has had nausea and vomiting since yesterday, lightheadedness and dizziness.  She denies any significant abdominal pain.  She denies fevers or chills.  She presents to the ER.   In the ER, patient has been given 1 L NS bolus, IV Zofran, IV Reglan, IV morphine, and IV Ativan.  Morphine given as coverage for possible withdrawal, patient maintained on p.o. narcotics.  Patient continues to have bilious emesis in the ER.  Admission requested.  Assessment & Plan:   Principal Problem:   Cyclical vomiting with nausea Active Problems:   Inappropriate sinus tachycardia   Lactic acidosis   Hypothyroidism   Essential hypertension   Mixed diabetic hyperlipidemia associated with type 2 diabetes mellitus (HCC)   Bipolar affective disorder (HCC)  Cyclic Vomiting  Will continue reglan for now Prn phenergan ? Of diabetic gastroparesis --- I don't see gastric emptying study, with her hx this is probably worth obtaining at some poing in the future CLD, advance as tolerated Will resume home pain meds as tolerated (IV pain meds only if she's unable to tolerate PO)  Type 2 diabetes mellitus ([place]) -Sliding scale insulin ordered -A1c 5.3 04/2022  Lactic acidosis Continue IVF, mild   Bipolar affective disorder (HCC)/ PTSD (post-traumatic stress disorder) -Prozac,and  Abilify resumed   Essential hypertension -Metoprolol, Norvasc resumed   Inappropriate sinus tachycardia -Metoprolol resumed.   GERD Continue twice daily Protonix IV   Hypothyroidism Synthroid resumed   Mixed diabetic hyperlipidemia Lipitor resumed   Chronic pain syndrome Zanaflex resumed Percocet   h/o Anoxic brain damage (HCC)    DVT prophylaxis: SCD Code Status: full Family Communication: none Disposition:   Status is: Inpatient Remains inpatient appropriate because: continued need for inpatient care, inability to tolerate PO   Consultants:  none  Procedures:  none  Antimicrobials:  Anti-infectives (From admission, onward)    None       Subjective: No new complaints, notes reglan typically helps her symptoms  Objective: Vitals:   07/12/22 0346 07/12/22 0408 07/12/22 0524 07/12/22 1533  BP:  (!) 153/90 (!) 193/72 (!) 82/51  Pulse:  (!) 113 (!) 110 64  Resp:  15 18 15   Temp: 99.2 F (37.3 C) 98.1 F (36.7 C) 98.6 F (37 C) 98.4 F (36.9 C)  TempSrc:  Oral Oral Oral  SpO2:  96% 100% 96%  Weight:      Height:        Intake/Output Summary (Last 24 hours) at 07/12/2022 1556 Last data filed at 07/12/2022 1133 Gross per 24 hour  Intake 2000 ml  Output 600 ml  Net 1400 ml   Filed Weights   07/11/22 1737  Weight: 83.9 kg    Examination:  General exam: Appears calm and comfortable  Respiratory system: unlabored Cardiovascular system: RRR Gastrointestinal system: mild RLQ TTP  Central nervous system: Alert and oriented. No focal neurological deficits.  Extremities: no LEE   Data Reviewed: I have personally reviewed following labs and imaging studies  CBC: Recent Labs  Lab 07/11/22 2040 07/12/22 0418  WBC 8.5 9.5  NEUTROABS 7.0 7.5  HGB 12.8 11.4*  HCT 39.7 35.2*  MCV 95.9 94.9  PLT 356 [id number]    Basic Metabolic Panel: Recent Labs  Lab 07/11/22 2040 07/12/22 0418 07/12/22 0813  NA 141 142  --   K 4.1 3.3*  --   CL 107 110   --   CO2 21* 22  --   GLUCOSE 107* 105*  --   BUN <5* <5*  --   CREATININE 0.92 0.79  --   CALCIUM 8.8* 8.2*  --   MG  --   --  1.9    GFR: Estimated Creatinine Clearance: 87.2 mL/min (by C-G formula based on SCr of 0.79 mg/dL).  Liver Function Tests: Recent Labs  Lab 07/11/22 2040  AST 31  ALT 23  ALKPHOS 138*  BILITOT 0.8  PROT 7.1  ALBUMIN 3.0*    CBG: Recent Labs  Lab 07/12/22 0050 07/12/22 0748 07/12/22 1255  GLUCAP 92 83 103*     No results found for this or any previous visit (from the past 240 hour(s)).       Radiology Studies: No results found.      Scheduled Meds:  ARIPiprazole  5 mg Oral Daily   FLUoxetine  40 mg Oral Daily   insulin aspart  0-15 Units Subcutaneous Q4H   levothyroxine  50 mcg Oral Q0600   metoCLOPramide (REGLAN) injection  10 mg Intravenous Q6H   metoprolol succinate  100 mg Oral Daily   mouth rinse  15 mL Mouth Rinse 4 times per day   pantoprazole (PROTONIX) IV  40 mg Intravenous Q12H   polyethylene glycol  17 g Oral BID   traZODone  150 mg Oral QHS   Continuous Infusions:  lactated ringers 75 mL/hr at 07/12/22 0528   promethazine (PHENERGAN) injection (IM or IVPB)       LOS: 0 days    Time spent: over 30 min    Fayrene Helper, MD [facility]   To contact the attending provider between 7A-7P or the covering provider during after hours 7P-7A, please log into the web site [email or web address] and access using universal [place] password for that web site. If you do not have the password, please call the hospital operator.  07/12/2022, 3:56 PM

## 2022-07-12 NOTE — ED Notes (Signed)
Pt had multiple episodes of vomiting green liquids.  Pt gown changed x2 and emesis bag given.  Call bell in reach

## 2022-07-13 DIAGNOSIS — R1115 Cyclical vomiting syndrome unrelated to migraine: Secondary | ICD-10-CM | POA: Diagnosis not present

## 2022-07-13 LAB — RAPID URINE DRUG SCREEN, HOSP PERFORMED
Amphetamines: POSITIVE — AB
Barbiturates: NOT DETECTED
Benzodiazepines: NOT DETECTED
Cocaine: NOT DETECTED
Opiates: POSITIVE — AB
Tetrahydrocannabinol: NOT DETECTED

## 2022-07-13 LAB — CBC WITH DIFFERENTIAL/PLATELET
Abs Immature Granulocytes: 0.02 10*3/uL (ref 0.00–0.07)
Basophils Absolute: 0.1 10*3/uL (ref 0.0–0.1)
Basophils Relative: 1 %
Eosinophils Absolute: 0.4 10*3/uL (ref 0.0–0.5)
Eosinophils Relative: 6 %
HCT: 36.1 % (ref 36.0–46.0)
Hemoglobin: 10.6 g/dL — ABNORMAL LOW (ref 12.0–15.0)
Immature Granulocytes: 0 %
Lymphocytes Relative: 34 %
Lymphs Abs: 2.4 10*3/uL (ref 0.7–4.0)
MCH: 30.6 pg (ref 26.0–34.0)
MCHC: 29.4 g/dL — ABNORMAL LOW (ref 30.0–36.0)
MCV: 104.3 fL — ABNORMAL HIGH (ref 80.0–100.0)
Monocytes Absolute: 0.6 10*3/uL (ref 0.1–1.0)
Monocytes Relative: 8 %
Neutro Abs: 3.6 10*3/uL (ref 1.7–7.7)
Neutrophils Relative %: 51 %
Platelets: 253 10*3/uL (ref 150–400)
RBC: 3.46 MIL/uL — ABNORMAL LOW (ref 3.87–5.11)
RDW: 17.6 % — ABNORMAL HIGH (ref 11.5–15.5)
WBC: 7 10*3/uL (ref 4.0–10.5)
nRBC: 0 % (ref 0.0–0.2)

## 2022-07-13 LAB — COMPREHENSIVE METABOLIC PANEL
ALT: 20 U/L (ref 0–44)
AST: 33 U/L (ref 15–41)
Albumin: 2.3 g/dL — ABNORMAL LOW (ref 3.5–5.0)
Alkaline Phosphatase: 102 U/L (ref 38–126)
Anion gap: 8 (ref 5–15)
BUN: <5 mg/dL — ABNORMAL LOW (ref 6–20)
CO2: 22 mmol/L (ref 22–32)
Calcium: 8 mg/dL — ABNORMAL LOW (ref 8.9–10.3)
Chloride: 106 mmol/L (ref 98–111)
Creatinine, Ser: 0.62 mg/dL (ref 0.44–1.00)
GFR, Estimated: >60 mL/min (ref >60)
Glucose, Bld: 99 mg/dL (ref 70–99)
Potassium: 3.7 mmol/L (ref 3.5–5.1)
Sodium: 136 mmol/L (ref 135–145)
Total Bilirubin: 0.5 mg/dL (ref 0.3–1.2)
Total Protein: 5.6 g/dL — ABNORMAL LOW (ref 6.5–8.1)

## 2022-07-13 LAB — URINALYSIS, ROUTINE W REFLEX MICROSCOPIC
Bilirubin Urine: NEGATIVE
Glucose, UA: NEGATIVE mg/dL
Hgb urine dipstick: NEGATIVE
Ketones, ur: 5 mg/dL — AB
Nitrite: NEGATIVE
Protein, ur: NEGATIVE mg/dL
Specific Gravity, Urine: 1.014 (ref 1.005–1.030)
pH: 5 (ref 5.0–8.0)

## 2022-07-13 LAB — PHOSPHORUS: Phosphorus: 3.1 mg/dL (ref 2.5–4.6)

## 2022-07-13 LAB — GLUCOSE, CAPILLARY
Glucose-Capillary: 78 mg/dL (ref 70–99)
Glucose-Capillary: 99 mg/dL (ref 70–99)
Glucose-Capillary: 103 mg/dL — ABNORMAL HIGH (ref 70–99)
Glucose-Capillary: 94 mg/dL (ref 70–99)
Glucose-Capillary: 155 mg/dL — ABNORMAL HIGH (ref 70–99)

## 2022-07-13 LAB — MAGNESIUM: Magnesium: 1.8 mg/dL (ref 1.7–2.4)

## 2022-07-13 MED ORDER — METOCLOPRAMIDE HCL 10 MG PO TABS
10.0000 mg | ORAL_TABLET | Freq: Three times a day (TID) | ORAL | Status: DC
  Administered 2022-07-13 – 2022-07-14 (×4): 10 mg via ORAL
  Filled 2022-07-13 (×4): qty 1

## 2022-07-13 MED ORDER — LIP MEDEX EX OINT
TOPICAL_OINTMENT | CUTANEOUS | Status: DC | PRN
  Administered 2022-07-13: 75 via TOPICAL
  Filled 2022-07-13: qty 7

## 2022-07-13 NOTE — Progress Notes (Addendum)
PROGRESS NOTE    [name]  [id number] DOB: April 04, 1971 DOA: 07/11/2022 PCP: Ferd Hibbs, NP  Chief Complaint  Patient presents with   Emesis    Brief Narrative:   This is [name] 52y/o female with PMHx significant for diabetes mellitus type 2, hypothyroidism, bipolar disorder, PTSD, cyclical vomiting syndrome, HTN, prior history of cardiac arrest with MI in 06/2020 which led to anoxic brain injury (believed secondary to opiate overdose in February, UDS was positive for amphetamines).  Patient is [name] history of cyclic vomiting and has multiple readmissions for this.  Today patient presents with complaints of the same.  She states she has had nausea and vomiting since yesterday, lightheadedness and dizziness.  She denies any significant abdominal pain.  She denies fevers or chills.  She presents to the ER.   In the ER, patient has been given 1 L NS bolus, IV Zofran, IV Reglan, IV morphine, and IV Ativan.  Morphine given as coverage for possible withdrawal, patient maintained on p.o. narcotics.  Patient continues to have bilious emesis in the ER.  Admission requested.  Assessment & Plan:   Principal Problem:   Cyclical vomiting with nausea Active Problems:   Inappropriate sinus tachycardia   Lactic acidosis   Hypothyroidism   Essential hypertension   Mixed diabetic hyperlipidemia associated with type 2 diabetes mellitus (HCC)   Bipolar affective disorder (HCC)  Cyclic Vomiting  Will continue reglan for now Prn phenergan ? Of diabetic gastroparesis --- I don't see gastric emptying study, with her hx this is probably worth obtaining at some poing in the future Advance diet, will see how she does with transition to oral reglan  Will resume home pain meds as tolerated (IV pain meds only if she's unable to tolerate PO)  Sinus Bradycardia Noted on tele Follow EKG  Type 2 diabetes mellitus (HCC) -Sliding scale insulin ordered -A1c 5.3 04/2022  Lactic acidosis Continue IVF,  mild   Bipolar affective disorder (HCC)/ PTSD (post-traumatic stress disorder) -Prozac,and Abilify resumed   Essential hypertension -Metoprolol, Norvasc resumed   Inappropriate sinus tachycardia -Metoprolol resumed.   GERD Continue twice daily Protonix IV   Hypothyroidism Synthroid resumed   Mixed diabetic hyperlipidemia Lipitor resumed   Chronic pain syndrome Zanaflex resumed Percocet   h/o Anoxic brain damage (HCC)    DVT prophylaxis: SCD Code Status: full Family Communication: none Disposition:   Status is: Inpatient Remains inpatient appropriate because: continued need for inpatient care, inability to tolerate PO   Consultants:  none  Procedures:  none  Antimicrobials:  Anti-infectives (From admission, onward)    None       Subjective: Feels [name] bit better Willing to advance diet  Objective: Vitals:   07/12/22 1533 07/12/22 2022 07/13/22 0438 07/13/22 1423  BP: (!) 82/51 105/73 135/85 (!) 87/46  Pulse: 64 (!) 58 61 (!) 57  Resp: 15 18 18 18   Temp: 98.4 F (36.9 C) 97.9 F (36.6 C) 98.1 F (36.7 C) 98 F (36.7 C)  TempSrc: Oral Oral Oral Oral  SpO2: 96% 100% 99% 99%  Weight:      Height:        Intake/Output Summary (Last 24 hours) at 07/13/2022 1447 Last data filed at 07/13/2022 1423 Gross per 24 hour  Intake 1873.57 ml  Output 450 ml  Net 1423.57 ml   Filed Weights   07/11/22 1737  Weight: 83.9 kg    Examination:  General: No acute distress. Cardiovascular: RRR- sinus brady on tele Lungs: unlabored Abdomen: Soft,  nontender, nondistended Neurological: Alert and oriented 3. Moves all extremities 4 . Cranial nerves II through XII grossly intact. Skin: Warm and dry. No rashes or lesions. Extremities: No clubbing or cyanosis. No edema.   Data Reviewed: I have personally reviewed following labs and imaging studies  CBC: Recent Labs  Lab 07/11/22 2040 07/12/22 0418 07/13/22 0704  WBC 8.5 9.5 7.0  NEUTROABS 7.0 7.5 3.6   HGB 12.8 11.4* 10.6*  HCT 39.7 35.2* 36.1  MCV 95.9 94.9 104.3*  PLT 356 375 696    Basic Metabolic Panel: Recent Labs  Lab 07/11/22 2040 07/12/22 0418 07/12/22 0813 07/13/22 0704  NA 141 142  --  136  K 4.1 3.3*  --  3.7  CL 107 110  --  106  CO2 21* 22  --  22  GLUCOSE 107* 105*  --  99  BUN <5* <5*  --  <5*  CREATININE 0.92 0.79  --  0.62  CALCIUM 8.8* 8.2*  --  8.0*  MG  --   --  1.9 1.8  PHOS  --   --   --  3.1    GFR: Estimated Creatinine Clearance: 87.2 mL/min (by C-G formula based on SCr of 0.62 mg/dL).  Liver Function Tests: Recent Labs  Lab 07/11/22 2040 07/13/22 0704  AST 31 33  ALT 23 20  ALKPHOS 138* 102  BILITOT 0.8 0.5  PROT 7.1 5.6*  ALBUMIN 3.0* 2.3*    CBG: Recent Labs  Lab 07/12/22 2023 07/12/22 2355 07/13/22 0401 07/13/22 0730 07/13/22 1104  GLUCAP 78 90 78 99 103*     Recent Results (from the past 240 hour(s))  Culture, blood (routine x 2)     Status: None (Preliminary result)   Collection Time: 07/11/22  9:40 PM   Specimen: BLOOD  Result Value Ref Range Status   Specimen Description   Final    BLOOD LEFT ANTECUBITAL Performed at [facility], [place] [address] [place], [place] [address]    Special Requests   Final    BOTTLES DRAWN AEROBIC AND ANAEROBIC Blood Culture adequate volume Performed at [place] [address], [place], [place] [address]    Culture   Final    NO GROWTH 1 DAY Performed at [facility], [place] [address] [place], [place] [address]    Report Status PENDING  Incomplete  Culture, blood (routine x 2)     Status: None (Preliminary result)   Collection Time: 07/12/22  9:51 AM   Specimen: BLOOD  Result Value Ref Range Status   Specimen Description   Final    BLOOD RIGHT ANTECUBITAL Performed at [place] [address] [place], [place] [address]    Special Requests   Final    BOTTLES DRAWN AEROBIC ONLY Blood Culture results may  not be optimal due to an inadequate volume of blood received in culture bottles Performed at [place] [address] [place], [place] [address]    Culture   Final    NO GROWTH < 24 HOURS Performed at [place] [address], [place], [place] [address]    Report Status PENDING  Incomplete         Radiology Studies: No results found.      Scheduled Meds:  ARIPiprazole  5 mg Oral Daily   FLUoxetine  40 mg Oral Daily   insulin aspart  0-15 Units Subcutaneous Q4H   levothyroxine  50  mcg Oral Q0600   metoCLOPramide (REGLAN) injection  10 mg Intravenous Q6H   metoprolol succinate  100 mg Oral Daily   mouth rinse  15 mL Mouth Rinse 4 times per day   oxyCODONE-acetaminophen  1 tablet Oral QID   pantoprazole (PROTONIX) IV  40 mg Intravenous Q12H   polyethylene glycol  17 g Oral BID   traZODone  150 mg Oral QHS   Continuous Infusions:  lactated ringers 75 mL/hr at 07/13/22 0819   promethazine (PHENERGAN) injection (IM or IVPB)       LOS: 1 day    Time spent: over 30 min    Fayrene Helper, MD [facility]   To contact the attending provider between 7A-7P or the covering provider during after hours 7P-7A, please log into the web site [email or web address] and access using universal [place] password for that web site. If you do not have the password, please call the hospital operator.  07/13/2022, 2:47 PM

## 2022-07-13 NOTE — TOC Progression Note (Signed)
Transition of Care [facility]) - Progression Note    Patient Details  Name: [name] MRN: [id number] Date of Birth: 1970/11/13  Transition of Care [facility]) CM/SW [place], RN Phone Number:[phone]  07/13/2022, 3:02 PM  Clinical Narrative:     Transition of Care [facility]) Screening Note   Patient Details  Name: [name] Date of Birth: 1970/11/04   Transition of Care [facility]) CM/SW Contact:    Angelita Ingles, RN Phone Number: 07/13/2022, 3:02 PM    Transition of Care Department [facility]) has reviewed patient and no TOC needs have been identified at this time. We will continue to monitor patient advancement through interdisciplinary progression rounds. If new patient transition needs arise, please place a TOC consult.          Expected Discharge Plan and Services                                               Social Determinants of Health (SDOH) Interventions SDOH Screenings   Food Insecurity: No Food Insecurity (06/25/2022)  Housing: Low Risk  (06/25/2022)  Transportation Needs: No Transportation Needs (06/25/2022)  Utilities: Not At Risk (06/25/2022)  Depression (PHQ2-9): Low Risk  (08/16/2020)  Tobacco Use: Low Risk  (07/11/2022)    Readmission Risk Interventions    06/29/2022   12:45 PM 05/22/2022    9:25 AM 04/27/2022    9:05 AM  Readmission Risk Prevention Plan  Transportation Screening Complete Complete Complete  Medication Review Press photographer) Complete Complete Complete  PCP or Specialist appointment within 3-5 days of discharge Complete Complete Complete  HRI or Home Care Consult Complete Complete Complete  SW Recovery Care/Counseling Consult Complete Complete Complete  Palliative Care Screening Not Applicable Not Applicable Not [place] Not Applicable Not Applicable Not Applicable

## 2022-07-13 NOTE — Progress Notes (Signed)
PT Cancellation Note  Patient Details Name: [name] MRN: [id number] DOB: 09/21/1970   Cancelled Treatment:    Reason Eval/Treat Not Completed: Patient declined, " come back in 30 minutes." Per patient, then getting EKG. Will check back tomorrow. [place] Office [phone] Weekend [id number]   [name] 07/13/2022, 4:20 PM

## 2022-07-13 NOTE — TOC Progression Note (Deleted)
Transition of Care [facility]) - Progression Note    Patient Details  Name: [name] MRN: [id number] Date of Birth: May 02, 1970  Transition of Care [facility]) CM/SW [place], RN Phone Number:[phone]  07/13/2022, 3:45 PM  Clinical Narrative:     Transition of Care [facility]) Screening Note   Patient Details  Name: [name] Date of Birth: 1970-09-14   Transition of Care [facility]) CM/SW Contact:    Angelita Ingles, RN Phone Number: 07/13/2022, 3:45 PM    Transition of Care Department [facility]) has reviewed patient and no TOC needs have been identified at this time. We will continue to monitor patient advancement through interdisciplinary progression rounds. If new patient transition needs arise, please place a TOC consult.          Expected Discharge Plan and Services                                               Social Determinants of Health (SDOH) Interventions SDOH Screenings   Food Insecurity: No Food Insecurity (06/25/2022)  Housing: Low Risk  (06/25/2022)  Transportation Needs: No Transportation Needs (06/25/2022)  Utilities: Not At Risk (06/25/2022)  Depression (PHQ2-9): Low Risk  (08/16/2020)  Tobacco Use: Low Risk  (07/11/2022)    Readmission Risk Interventions    06/29/2022   12:45 PM 05/22/2022    9:25 AM 04/27/2022    9:05 AM  Readmission Risk Prevention Plan  Transportation Screening Complete Complete Complete  Medication Review Press photographer) Complete Complete Complete  PCP or Specialist appointment within 3-5 days of discharge Complete Complete Complete  HRI or Home Care Consult Complete Complete Complete  SW Recovery Care/Counseling Consult Complete Complete Complete  Palliative Care Screening Not Applicable Not Applicable Not [place] Not Applicable Not Applicable Not Applicable

## 2022-07-14 ENCOUNTER — Other Ambulatory Visit: Payer: Self-pay | Admitting: Physician Assistant

## 2022-07-14 DIAGNOSIS — R1115 Cyclical vomiting syndrome unrelated to migraine: Secondary | ICD-10-CM | POA: Diagnosis not present

## 2022-07-14 DIAGNOSIS — I4711 Inappropriate sinus tachycardia, so stated: Secondary | ICD-10-CM

## 2022-07-14 LAB — COMPREHENSIVE METABOLIC PANEL
ALT: 21 U/L (ref 0–44)
AST: 33 U/L (ref 15–41)
Albumin: 2.1 g/dL — ABNORMAL LOW (ref 3.5–5.0)
Alkaline Phosphatase: 97 U/L (ref 38–126)
Anion gap: 6 (ref 5–15)
BUN: 6 mg/dL (ref 6–20)
CO2: 26 mmol/L (ref 22–32)
Calcium: 7.9 mg/dL — ABNORMAL LOW (ref 8.9–10.3)
Chloride: 108 mmol/L (ref 98–111)
Creatinine, Ser: 0.59 mg/dL (ref 0.44–1.00)
GFR, Estimated: >60 mL/min (ref >60)
Glucose, Bld: 109 mg/dL — ABNORMAL HIGH (ref 70–99)
Potassium: 4 mmol/L (ref 3.5–5.1)
Sodium: 140 mmol/L (ref 135–145)
Total Bilirubin: 0.7 mg/dL (ref 0.3–1.2)
Total Protein: 5.3 g/dL — ABNORMAL LOW (ref 6.5–8.1)

## 2022-07-14 LAB — CBC WITH DIFFERENTIAL/PLATELET
Abs Immature Granulocytes: 0.02 10*3/uL (ref 0.00–0.07)
Basophils Absolute: 0.1 10*3/uL (ref 0.0–0.1)
Basophils Relative: 1 %
Eosinophils Absolute: 0.5 10*3/uL (ref 0.0–0.5)
Eosinophils Relative: 7 %
HCT: 34.3 % — ABNORMAL LOW (ref 36.0–46.0)
Hemoglobin: 10.7 g/dL — ABNORMAL LOW (ref 12.0–15.0)
Immature Granulocytes: 0 %
Lymphocytes Relative: 23 %
Lymphs Abs: 1.6 10*3/uL (ref 0.7–4.0)
MCH: 30.8 pg (ref 26.0–34.0)
MCHC: 31.2 g/dL (ref 30.0–36.0)
MCV: 98.8 fL (ref 80.0–100.0)
Monocytes Absolute: 0.5 10*3/uL (ref 0.1–1.0)
Monocytes Relative: 8 %
Neutro Abs: 4.3 10*3/uL (ref 1.7–7.7)
Neutrophils Relative %: 61 %
Platelets: 285 10*3/uL (ref 150–400)
RBC: 3.47 MIL/uL — ABNORMAL LOW (ref 3.87–5.11)
RDW: 17 % — ABNORMAL HIGH (ref 11.5–15.5)
WBC: 7 10*3/uL (ref 4.0–10.5)
nRBC: 0 % (ref 0.0–0.2)

## 2022-07-14 LAB — MAGNESIUM: Magnesium: 1.8 mg/dL (ref 1.7–2.4)

## 2022-07-14 LAB — PHOSPHORUS: Phosphorus: 3.4 mg/dL (ref 2.5–4.6)

## 2022-07-14 LAB — GLUCOSE, CAPILLARY
Glucose-Capillary: 117 mg/dL — ABNORMAL HIGH (ref 70–99)
Glucose-Capillary: 145 mg/dL — ABNORMAL HIGH (ref 70–99)
Glucose-Capillary: 61 mg/dL — ABNORMAL LOW (ref 70–99)
Glucose-Capillary: 82 mg/dL (ref 70–99)
Glucose-Capillary: 99 mg/dL (ref 70–99)

## 2022-07-14 MED ORDER — METOPROLOL SUCCINATE ER 25 MG PO TB24: 25.0000 mg | ORAL_TABLET | Freq: Every day | ORAL | 0 refills | Status: DC

## 2022-07-14 MED ORDER — TIZANIDINE HCL 4 MG PO TABS: 12.0000 mg | ORAL_TABLET | Freq: Three times a day (TID) | ORAL | 0 refills | Status: DC | PRN

## 2022-07-14 MED ORDER — METOPROLOL SUCCINATE ER 25 MG PO TB24: 25.0000 mg | ORAL_TABLET | Freq: Every day | ORAL | Status: DC

## 2022-07-14 MED ORDER — METOPROLOL SUCCINATE ER 50 MG PO TB24
50.0000 mg | ORAL_TABLET | Freq: Every day | ORAL | Status: DC
  Filled 2022-07-14: qty 1

## 2022-07-14 NOTE — Progress Notes (Signed)
Heart monitor ordered

## 2022-07-14 NOTE — Evaluation (Signed)
Physical Therapy One Time Evaluation Patient Details Name: [name] MRN: [id number] DOB: Apr 17, 1971 Today's Date: 07/14/2022  History of Present Illness  52 y/o female with PMHx significant for diabetes mellitus type 2, hypothyroidism, bipolar disorder, PTSD, cyclical vomiting syndrome, HTN, prior history of cardiac arrest with MI in 06/2020 which led to anoxic brain injury (believed secondary to opiate overdose in February, UDS was positive for amphetamines). Pt also with recent admission beginning of March for low back pain and found to have new L4 compression fx.  Pt admitted [id number] for Cyclical vomiting with nausea  Clinical Impression  Patient evaluated by Physical Therapy with no further acute PT needs identified. All education has been completed and the patient has no further questions.  Pt able to ambulate in hallway with RW and appears close to her current mobility baseline.  SpO2 96% on room air at rest and then 98% on room air with ambulating.  Pt with recent admission and found to have L4 compression fx.  Pt reports wearing LSO at home.  Pt was set up with HHPT however reports she had not yet started this so recommend resume HHPT upon d/c.  Pt anticipates d/c home today. PT is signing off. Thank you for this referral.        Recommendations for follow up therapy are one component of a multi-disciplinary discharge planning process, led by the attending physician.  Recommendations may be updated based on patient status, additional functional criteria and insurance authorization.  Follow Up Recommendations Home health PT      Assistance Recommended at Discharge PRN  Patient can return home with the following       Equipment Recommendations None recommended by PT  Recommendations for Other Services       Functional Status Assessment Patient has had a recent decline in their functional status and demonstrates the ability to make significant improvements in function in a  reasonable and predictable amount of time.     Precautions / Restrictions Precautions Precautions: Fall;Back Precaution Comments: recent L4 compression fx Spinal Brace Comments: LSO at home but pt does report wearing brace since last admission      Mobility  Bed Mobility Overal bed mobility: Needs Assistance Bed Mobility: Rolling, Sidelying to Sit, Sit to Sidelying Rolling: Supervision Sidelying to sit: Supervision, HOB elevated     Sit to sidelying: Supervision, HOB elevated      Transfers Overall transfer level: Needs assistance Equipment used: Rolling walker (2 wheels) Transfers: Sit to/from Stand Sit to Stand: Min guard, Supervision           General transfer comment: one cue for hand placement    Ambulation/Gait Ambulation/Gait assistance: Min guard, Supervision Gait Distance (Feet): 160 Feet Assistive device: Rolling walker (2 wheels) Gait Pattern/deviations: Step-through pattern, Decreased stride length       General Gait Details: no unsteadiness or LOB observed, denies any symptoms other then weakness  Stairs            Wheelchair Mobility    Modified Rankin (Stroke Patients Only)       Balance Overall balance assessment: History of Falls                                           Pertinent Vitals/Pain Pain Assessment Pain Assessment: No/denies pain Pain Intervention(s): Monitored during session, Repositioned    Home Living Family/patient expects to  be discharged to:: Private residence Living Arrangements: Spouse/significant other;Children (son) Available Help at Discharge: Family Type of Home: House Home Access: Stairs to enter Entrance Stairs-Rails: Right Entrance Stairs-Number of Steps: 3-4   Home Layout: One level Home Equipment: Conservation officer, nature (2 wheels);Cane - single point;Wheelchair - Brewing technologist      Prior Function Prior Level of Function : Independent/Modified Independent              Mobility Comments: using RW since last admission, hx of falls       Hand Dominance        Extremity/Trunk Assessment        Lower Extremity Assessment Lower Extremity Assessment: Generalized weakness       Communication   Communication: No difficulties  Cognition Arousal/Alertness: Awake/alert Behavior During Therapy: WFL for tasks assessed/performed Overall Cognitive Status: Within Functional Limits for tasks assessed                                          General Comments      Exercises     Assessment/Plan    PT Assessment All further PT needs can be met in the next venue of care  PT Problem List Decreased strength;Decreased activity tolerance;Decreased balance;Decreased mobility;Decreased knowledge of use of DME;Decreased knowledge of precautions       PT Treatment Interventions      PT Goals (Current goals can be found in the Care Plan section)  Acute Rehab PT Goals PT Goal Formulation: All assessment and education complete, DC therapy    Frequency       Co-evaluation               AM-PAC PT "6 Clicks" Mobility  Outcome Measure Help needed turning from your back to your side while in a flat bed without using bedrails?: None Help needed moving from lying on your back to sitting on the side of a flat bed without using bedrails?: None Help needed moving to and from a bed to a chair (including a wheelchair)?: None Help needed standing up from a chair using your arms (e.g., wheelchair or bedside chair)?: None Help needed to walk in hospital room?: A Little Help needed climbing 3-5 steps with a railing? : A Little 6 Click Score: 22    End of Session Equipment Utilized During Treatment: Gait belt Activity Tolerance: Patient tolerated treatment well Patient left: in bed;with call bell/phone within reach Nurse Communication: Mobility status PT Visit Diagnosis: Difficulty in walking, not elsewhere classified (R26.2);Muscle  weakness (generalized) (M62.81)    Time: [id number] PT Time Calculation (min) (ACUTE ONLY): 15 min   Charges:   PT Evaluation $PT Eval Low Complexity: 1 Low        Kati PT, DPT Physical Therapist Acute Rehabilitation Services Preferred contact method: Secure Chat Weekend Pager Only: [phone] Office: [place] 07/14/2022, 2:03 PM

## 2022-07-14 NOTE — TOC Transition Note (Addendum)
Transition of Care [facility]) - CM/SW Discharge Note   Patient Details  Name: [name] MRN: [id number] Date of Birth: May 14, 1970  Transition of Care [facility]) CM/SW Contact:  Roseanne Kaufman, RN Phone Number: 07/14/2022, 12:00 PM   Clinical Narrative:   Per chart review PT to return to see patient today, currently no PT recommendations.  Per MD likely [facility] services needed, will need [place] orders, notified MD. Notified PT to determine if able to see patient today.   TOC will follow for needs.     - 12:50pm This RNCM spoke with patient at bedside who reports her PCP has set her HHPT services up and she's awaiting a call from the [facility] agency. Patient unable to provide the name of the [facility] agency, however declined assistance due to having [facility] service set up already. This RNCM notified MD to request HHPT/OT orders due to patient being admitted inpatient.   Transportation at discharge: family  No additional TOC needs, signing off.    Final next level of care: Home w Home Health Services Barriers to Discharge: No Barriers Identified   Patient Goals and CMS Choice CMS Medicare.gov Compare Post Acute Care list provided to:: Patient Choice offered to / list presented to : Patient  Discharge Placement    Home with home health services that were arranged prior to discharge.                     Discharge Plan and Services Additional resources added to the After Visit Summary for                  DME Arranged: N/A DME Agency: NA                  Social Determinants of Health (SDOH) Interventions SDOH Screenings   Food Insecurity: No Food Insecurity (06/25/2022)  Housing: Low Risk  (06/25/2022)  Transportation Needs: No Transportation Needs (06/25/2022)  Utilities: Not At Risk (06/25/2022)  Depression (PHQ2-9): Low Risk  (08/16/2020)  Tobacco Use: Low Risk  (07/11/2022)     Readmission Risk Interventions    06/29/2022   12:45 PM 05/22/2022    9:25 AM 04/27/2022    9:05 AM  Readmission  Risk Prevention Plan  Transportation Screening Complete Complete Complete  Medication Review Press photographer) Complete Complete Complete  PCP or Specialist appointment within 3-5 days of discharge Complete Complete Complete  HRI or Home Care Consult Complete Complete Complete  SW Recovery Care/Counseling Consult Complete Complete Complete  Palliative Care Screening Not Applicable Not Applicable Not [place] Not Applicable Not Applicable Not Applicable

## 2022-07-14 NOTE — Progress Notes (Signed)
Patient received flu shot on February 04, 2022

## 2022-07-14 NOTE — Discharge Summary (Addendum)
Physician Discharge Summary  [name] [id number] DOB: Mar 15, 1971 DOA: 07/11/2022  PCP: Ferd Hibbs, NP  Admit date: 07/11/2022 Discharge date: 07/14/2022  Time spent: 40 minutes  Recommendations for Outpatient Follow-up:  Follow outpatient CBC/CMP  Follow heart rate outpatient, ziopatch requested - metoprolol reduced Follow compression fracture/osteoporosis outpatient Ensure up to date with colon cancer screening outpatient - ? Peristalsis artifact  Consider gastric emptying study outpatient   Discharge Diagnoses:  Principal Problem:   Cyclical vomiting with nausea Active Problems:   Inappropriate sinus tachycardia   Lactic acidosis   Hypothyroidism   Essential hypertension   Mixed diabetic hyperlipidemia associated with type 2 diabetes mellitus ([place])   Bipolar affective disorder ([place])   Discharge Condition: stable  Diet recommendation: heart healthy  Filed Weights   07/11/22 1737  Weight: 83.9 kg    History of present illness:   This is [name] 52y/o female with PMHx significant for diabetes mellitus type 2, hypothyroidism, bipolar disorder, PTSD, cyclical vomiting syndrome, HTN, prior history of cardiac arrest with MI in 06/2020 which led to anoxic brain injury (believed secondary to opiate overdose in February, UDS was positive for amphetamines).  Patient is [name] history of cyclic vomiting and has multiple readmissions for this.    Presented with symptoms concerning for cyclic vomiting.  Improved with supportive care, reglan.  Hospitalization c/b bradycardia, metoprolol reduced at discharge (hold for HR <60).  See below for additional details  Hospital Course:  Assessment and Plan:  Cyclic Vomiting  Symptoms improved Continue reglan outpatient  ? Of diabetic gastroparesis --- I don't see gastric emptying study, with her hx this is probably worth obtaining at some poing in the future Doing well on regular diet   Sinus Bradycardia  Inappropriate Sinus  Tachycardia Started on metop for inappropriate sinus tachy, but she's been bradycardic here Will reduce metop to 25 mg daily and have her follow outpatient Request ziopatch   Type 2 diabetes mellitus ([place]) -Sliding scale insulin ordered -A1c 5.3 04/2022   Lactic acidosis Continue IVF, mild   Bipolar affective disorder (HCC)/ PTSD (post-traumatic stress disorder) -Prozac,and Abilify resumed   Essential hypertension -Metoprolol, Norvasc resumed   GERD PPI   Hypothyroidism Synthroid resumed   Mixed diabetic hyperlipidemia Lipitor resumed   Chronic pain syndrome Zanaflex resumed Percocet   h/o Anoxic brain damage ([place])  L4 Compression fracture Follow outpatient with PCP  On O2 I assume for comfort (no findings concerning for SOB), will ask RN to wean prior to d/c     Procedures: none   Consultations: none  Discharge Exam: Vitals:   07/13/22 2214 07/14/22 0701  BP: (!) 93/55 (!) 106/91  Pulse: (!) 51 (!) 53  Resp: 17 18  Temp: 98.1 F (36.7 C) 97.7 F (36.5 C)  SpO2: 97% 100%   Eager to discharge home Asks for refill of tizanidine  General: No acute distress. Cardiovascular: regular, brady Lungs: unlabored Abdomen: Soft, nontender, nondistended  Neurological: Alert and oriented 3. Moves all extremities 4 with equal strength. Cranial nerves II through XII grossly intact. Extremities: No clubbing or cyanosis. No edema.  Discharge Instructions   Discharge Instructions     Call MD for:  difficulty breathing, headache or visual disturbances   Complete by: As directed    Call MD for:  extreme fatigue   Complete by: As directed    Call MD for:  hives   Complete by: As directed    Call MD for:  persistant dizziness or light-headedness   Complete  by: As directed    Call MD for:  persistant nausea and vomiting   Complete by: As directed    Call MD for:  redness, tenderness, or signs of infection (pain, swelling, redness, odor or green/yellow  discharge around incision site)   Complete by: As directed    Call MD for:  severe uncontrolled pain   Complete by: As directed    Call MD for:  temperature >100.4   Complete by: As directed    Diet - low sodium heart healthy   Complete by: As directed    Discharge instructions   Complete by: As directed    You were seen for cyclic vomiting.  This has improved with reglan and antiemetics.  Continue reglan as prescribed.  Follow up with your PCP to discuss the long term plan regarding the reglan.  Your heart rate is slow here.  You were recently started on [name] beta blocker which slows your heart rate.  I'll reduce your metoprolol to 25 mg daily.  Follow your heart rate with your PCP as an outpatient.  I'll ask cardiology to arrange Marlette Curvin ziopatch for you to monitor your heart rate more closely.  I'll refill you [name] short course of tizanidine until you can get [name] refill.  You should ensure you have up to date colon cancer screening.  There was an imaging finding on your CT that should be followed with your PCP, the suspicion is that it was due to "peristalsis artifact", but you need to be up to date with cancer screenings.  You have an L4 compression fracture that can be followed with your PCP outpatient.   Return for new, recurrent, or worsening symptoms.  Please ask your PCP to request records from this hospitalization so they know what was done and what the next steps will be.   Increase activity slowly   Complete by: As directed       Allergies as of 07/14/2022   No Known Allergies      Medication List     TAKE these medications    acetaminophen 500 MG tablet Commonly known as: TYLENOL Take 1,000 mg by mouth as needed for mild pain or headache.   ARIPiprazole 5 MG tablet Commonly known as: ABILIFY Take 1 tablet (5 mg total) by mouth daily.   atorvastatin 10 MG tablet Commonly known as: LIPITOR Take 10 mg by mouth every evening.   FLUoxetine 40 MG capsule Commonly known  as: PROZAC Take 1 capsule (40 mg total) by mouth daily.   ibuprofen 200 MG tablet Commonly known as: ADVIL Take 400 mg by mouth every 6 (six) hours as needed for moderate pain.   levothyroxine 50 MCG tablet Commonly known as: SYNTHROID Take 50 mcg by mouth daily before breakfast.   lidocaine 5 % Commonly known as: LIDODERM Place 1 patch onto the skin daily. Remove & Discard patch within 12 hours or as directed by MD   metFORMIN 500 MG tablet Commonly known as: GLUCOPHAGE Take 500 mg by mouth 2 (two) times daily with Jaylee Lantry meal.   metoCLOPramide 5 MG tablet Commonly known as: REGLAN Take 5 mg by mouth 2 (two) times daily.   metoprolol succinate 25 MG 24 hr tablet Commonly known as: TOPROL-XL Take 1 tablet (25 mg total) by mouth daily. Follow up with your PCP regarding your heart rate and whether this should be continued. What changed:  medication strength how much to take additional instructions   multivitamin Tabs tablet Take 1 tablet  by mouth at bedtime.   ondansetron 8 MG disintegrating tablet Commonly known as: ZOFRAN-ODT Take 1 tablet (8 mg total) by mouth every 8 (eight) hours as needed for nausea or vomiting. What changed: when to take this   oxyCODONE-acetaminophen 7.5-325 MG tablet Commonly known as: PERCOCET Take 1 tablet by mouth 4 (four) times daily.   pantoprazole 40 MG tablet Commonly known as: Protonix Take 1 tablet (40 mg total) by mouth 2 (two) times daily.   prazosin 2 MG capsule Commonly known as: MINIPRESS Take 1 capsule (2 mg total) by mouth at bedtime.   promethazine 25 MG tablet Commonly known as: PHENERGAN Take 25 mg by mouth as needed for nausea or vomiting. What changed: Another medication with the same name was changed. Make sure you understand how and when to take each.   promethazine 25 MG suppository Commonly known as: PHENERGAN Place 1 suppository (25 mg total) rectally every 6 (six) hours as needed for nausea or vomiting. What  changed: when to take this   tiZANidine 4 MG tablet Commonly known as: ZANAFLEX Take 3 tablets (12 mg total) by mouth every 8 (eight) hours as needed for up to 7 days for muscle spasms. What changed: when to take this   traZODone 150 MG tablet Commonly known as: DESYREL Take 150 mg by mouth at bedtime. What changed: Another medication with the same name was removed. Continue taking this medication, and follow the directions you see here.       No Known Allergies    The results of significant diagnostics from this hospitalization (including imaging, microbiology, ancillary and laboratory) are listed below for reference.    Significant Diagnostic Studies: ECHOCARDIOGRAM COMPLETE  Result Date: 06/30/2022    ECHOCARDIOGRAM REPORT   Patient Name:   CYD MEELER Date of Exam: 06/30/2022 Medical Rec #:  [id number]       Height:       64.0 in Accession #:    [id number]      Weight:       185.0 lb Date of Birth:  March 28, 1971      BSA:          1.893 m Patient Age:    9 years        BP:           109/71 mmHg Patient Gender: F               HR:           85 bpm. Exam Location:  Inpatient Procedure: 2D Echo, Cardiac Doppler and Color Doppler Indications:    congestive heart failure  History:        Patient has prior history of Echocardiogram examinations, most                 recent 06/09/2020. Risk Factors:Hypertension, Diabetes and                 Dyslipidemia.  Sonographer:    Johny Chess RDCS Referring Phys: [id number] Sherryll Burger [facility]  Sonographer Comments: Image acquisition challenging due to patient body habitus. IMPRESSIONS  1. Left ventricular ejection fraction, by estimation, is 55 to 60%. The left ventricle has normal function. The left ventricle has no regional wall motion abnormalities. Left ventricular diastolic parameters are consistent with Grade I diastolic dysfunction (impaired relaxation). Elevated left atrial pressure.  2. Right ventricular systolic function is normal. The right  ventricular size is normal. There is severely elevated pulmonary artery systolic pressure.  3. The mitral valve is abnormal. Moderate mitral valve regurgitation. No evidence of mitral stenosis.  4. The tricuspid valve is abnormal. Tricuspid valve regurgitation is moderate.  5. The aortic valve has an indeterminant number of cusps. Aortic valve regurgitation is mild to moderate. No aortic stenosis is present.  6. The inferior vena cava is normal in size with greater than 50% respiratory variability, suggesting right atrial pressure of 3 mmHg. FINDINGS  Left Ventricle: Left ventricular ejection fraction, by estimation, is 55 to 60%. The left ventricle has normal function. The left ventricle has no regional wall motion abnormalities. The left ventricular internal cavity size was normal in size. There is  no left ventricular hypertrophy. Left ventricular diastolic parameters are consistent with Grade I diastolic dysfunction (impaired relaxation). Elevated left atrial pressure. Right Ventricle: The right ventricular size is normal. Right vetricular wall thickness was not well visualized. Right ventricular systolic function is normal. There is severely elevated pulmonary artery systolic pressure. The tricuspid regurgitant velocity is 3.92 m/s, and with an assumed right atrial pressure of 3 mmHg, the estimated right ventricular systolic pressure is 123456 mmHg. Left Atrium: Left atrial size was normal in size. Right Atrium: Right atrial size was normal in size. Pericardium: There is no evidence of pericardial effusion. Mitral Valve: The mitral valve is abnormal. Moderate mitral valve regurgitation. No evidence of mitral valve stenosis. Tricuspid Valve: The tricuspid valve is abnormal. Tricuspid valve regurgitation is moderate . No evidence of tricuspid stenosis. Aortic Valve: The aortic valve has an indeterminant number of cusps. Aortic valve regurgitation is mild to moderate. Aortic regurgitation PHT measures 351 msec. No  aortic stenosis is present. Pulmonic Valve: The pulmonic valve was not well visualized. Pulmonic valve regurgitation is not visualized. No evidence of pulmonic stenosis. Aorta: The aortic root is normal in size and structure. Venous: The inferior vena cava is normal in size with greater than 50% respiratory variability, suggesting right atrial pressure of 3 mmHg. IAS/Shunts: No atrial level shunt detected by color flow Doppler.  LEFT VENTRICLE PLAX 2D LVIDd:         4.60 cm   Diastology LVIDs:         3.30 cm   LV e' medial:    5.77 cm/s LV PW:         1.00 cm   LV E/e' medial:  20.1 LV IVS:        0.90 cm   LV e' lateral:   6.20 cm/s LVOT diam:     1.80 cm   LV E/e' lateral: 18.7 LV SV:         38 LV SV Index:   20 LVOT Area:     2.54 cm  RIGHT VENTRICLE RV Basal diam:  2.50 cm RV S prime:     11.60 cm/s TAPSE (M-mode): 1.7 cm LEFT ATRIUM             Index        RIGHT ATRIUM           Index LA diam:        3.00 cm 1.59 cm/m   RA Area:     10.70 cm LA Vol (A2C):   43.2 ml 22.83 ml/m  RA Volume:   24.40 ml  12.89 ml/m LA Vol (A4C):   41.2 ml 21.77 ml/m LA Biplane Vol: 46.0 ml 24.31 ml/m  AORTIC VALVE LVOT Vmax:   93.00 cm/s LVOT Vmean:  59.000 cm/s LVOT VTI:    0.149 m AI  PHT:      351 msec  AORTA Ao Root diam: 2.80 cm Ao Asc diam:  2.90 cm MITRAL VALVE                  TRICUSPID VALVE MV Area (PHT): 3.77 cm       TR Peak grad:   61.5 mmHg MV Decel Time: 201 msec       TR Vmax:        392.00 cm/s MR Peak grad:    102.4 mmHg MR Mean grad:    70.0 mmHg    SHUNTS MR Vmax:         506.00 cm/s  Systemic VTI:  0.15 m MR Vmean:        404.0 cm/s   Systemic Diam: 1.80 cm MR PISA:         1.01 cm MR PISA Eff ROA: 8 mm MR PISA Radius:  0.40 cm MV E velocity: 116.00 cm/s MV Yolani Vo velocity: 124.00 cm/s MV E/Delynda Sepulveda ratio:  0.94 Carlyle Dolly MD Electronically signed by Carlyle Dolly MD Signature Date/Time: 06/30/2022/1:57:40 PM    Final    EEG adult  Result Date: 06/28/2022 Lora Havens, MD     06/28/2022  4:52 PM  Patient Name: NORALYN ANDERBERG MRN: [id number] Epilepsy Attending: Lora Havens Referring Physician/Provider: Vernelle Emerald, MD Date: 06/28/2022 Duration: 23.06 mins Patient history: 52yo F with ams. EEG to evaluate for seizure. Level of alertness: Awake AEDs during EEG study: None Technical aspects: This EEG study was done with scalp electrodes positioned according to the 10-20 International system of electrode placement. Electrical activity was reviewed with band pass filter of 1-70Hz , sensitivity of 7 uV/mm, display speed of 20mm/sec with Camesha Farooq 60Hz  notched filter applied as appropriate. EEG data were recorded continuously and digitally stored.  Video monitoring was available and reviewed as appropriate. Description: The posterior dominant rhythm consists of 8 Hz activity of moderate voltage (25-35 uV) seen predominantly in posterior head regions, symmetric and reactive to eye opening and eye closing. Hyperventilation and photic stimulation were not performed.   IMPRESSION: This study is within normal limits. No seizures or epileptiform discharges were seen throughout the recording. Priyanka O Yadav   [place] EKG SITE RITE  Result Date: 06/27/2022 If Site Rite image not attached, placement could not be confirmed due to current cardiac rhythm.  DG Abd 1 View  Result Date: 06/27/2022 CLINICAL DATA:  Severe nausea. Clinical concern for bowel obstruction. EXAM: ABDOMEN - 1 VIEW COMPARISON:  Chest, abdomen and pelvis CT dated 06/25/2022 FINDINGS: Normal bowel gas pattern. Unremarkable bones. The patient refused Dia Jefferys 2nd image to include the inferior pelvis. IMPRESSION: No acute abnormality.  No evidence of bowel obstruction. Electronically Signed   By: Claudie Revering M.D.   On: 06/27/2022 09:34   MR LUMBAR SPINE WO CONTRAST  Result Date: 06/26/2022 CLINICAL DATA:  Compression fracture, lumbar EXAM: MRI LUMBAR SPINE WITHOUT CONTRAST TECHNIQUE: Multiplanar, multisequence MR imaging of the lumbar spine was performed. No  intravenous contrast was administered. COMPARISON:  CT 06/25/2022, MRI 09/25/2020 FINDINGS: Segmentation:  Standard. Alignment:  Physiologic. Vertebrae: Acute superior endplate compression fracture of the L4 vertebral body approximately 20% vertebral body height loss. Bone marrow edema at the fracture site. No significant bony retropulsion. No evidence of fracture extension into the posterior elements. Chronic mild superior endplate compression fracture of the L3 vertebral body with associated Schmorl's node. No residual bone marrow edema. Remaining vertebral body heights are maintained. No additional fractures.  No evidence of discitis. No marrow replacing bone lesion. Conus medullaris and cauda equina: Conus extends to the L2-L3 level. Conus and cauda equina appear normal. Paraspinal and other soft tissues: Negative. Disc levels: T12-L1 and L1-L2: Unremarkable. L2-L3: Disc desiccation without focal disc protrusion. No foraminal or canal stenosis. L3-L4: No significant disc protrusion, foraminal stenosis, or canal stenosis. L4-L5: No significant disc protrusion, foraminal stenosis, or canal stenosis. L5-S1: Disc desiccation and height loss with mild bulging disc, eccentric to the left. Mild left foraminal stenosis. No canal stenosis. IMPRESSION: 1. Acute superior endplate compression fracture of the L4 vertebral body with approximately 20% vertebral body height loss. No bony retropulsion. 2. Chronic mild superior endplate compression fracture of the L3 vertebral body. 3. Mild degenerative disc disease at L5-S1 with mild left foraminal stenosis. No canal stenosis at any level. Electronically Signed   By: Davina Poke D.O.   On: 06/26/2022 14:37   CT Head Wo Contrast  Result Date: 06/25/2022 CLINICAL DATA:  Fall EXAM: CT HEAD WITHOUT CONTRAST CT CERVICAL SPINE WITHOUT CONTRAST CT THORACIC SPINE WITHOUT CONTRAST CT LUMBAR  SPINE WITHOUT CONTRAST TECHNIQUE: Multidetector CT imaging of the head and cervical,  thoracic, and lumbar spine was performed following the standard protocol without intravenous contrast. Multiplanar CT image reconstructions of the cervical spine were also generated. RADIATION DOSE REDUCTION: This exam was performed according to the departmental dose-optimization program which includes automated exposure control, adjustment of the mA and/or kV according to patient size and/or use of iterative reconstruction technique. COMPARISON:  None Available. FINDINGS: CT HEAD FINDINGS Brain: No evidence of acute infarction, hemorrhage, hydrocephalus, extra-axial collection or mass lesion/mass effect. Vascular: No hyperdense vessel or unexpected calcification. Skull: Normal. Negative for fracture or focal lesion. Sinuses/Orbits: No middle ear or mastoid effusion. Paranasal sinuses are notable for trace mucosal thickening of the floor of the right maxillary sinus. Orbits are unremarkable. Other: None. CT CERVICAL, THORACIC, LUMBAR SPINE FINDINGS Alignment: Straightening of the normal cervical lordosis. Skull base and vertebrae: Cervical spine - no acute fracture. No primary bone lesion or focal pathologic process. Thoracic spine - mild superior endplate height loss at T11 is favored to be secondary to Takasha Vetere Schmorl's node. Lumbar spine - compared to 03/21/22, there is Denell Cothern new mild superior endplate compression deformity at L4. Unchanged Schmorl's node at L3. Soft tissues and spinal canal: No prevertebral fluid or swelling. No visible canal hematoma. Disc levels:  No evidence of high-grade spinal canal stenosis. Visualized chest: Negative. Please see same day CT chest abdomen and pelvis for additional findings, including Kamil Hanigan small left posterior diaphragmatic hernia, which is likely congenital. Other: There are few prominent cervical lymph nodes, which are nonspecific, and likely reactive. IMPRESSION: 1. No CT evidence of intracranial injury. 2. No acute fracture or traumatic listhesis in the cervical or thoracic spine 3.  New mild superior endplate compression deformity at L4, favored to be acute. Electronically Signed   By: Marin Roberts M.D.   On: 06/25/2022 12:23   CT Cervical Spine Wo Contrast  Result Date: 06/25/2022 CLINICAL DATA:  Fall EXAM: CT HEAD WITHOUT CONTRAST CT CERVICAL SPINE WITHOUT CONTRAST CT THORACIC SPINE WITHOUT CONTRAST CT LUMBAR  SPINE WITHOUT CONTRAST TECHNIQUE: Multidetector CT imaging of the head and cervical, thoracic, and lumbar spine was performed following the standard protocol without intravenous contrast. Multiplanar CT image reconstructions of the cervical spine were also generated. RADIATION DOSE REDUCTION: This exam was performed according to the departmental dose-optimization program which includes automated exposure control, adjustment of the  mA and/or kV according to patient size and/or use of iterative reconstruction technique. COMPARISON:  None Available. FINDINGS: CT HEAD FINDINGS Brain: No evidence of acute infarction, hemorrhage, hydrocephalus, extra-axial collection or mass lesion/mass effect. Vascular: No hyperdense vessel or unexpected calcification. Skull: Normal. Negative for fracture or focal lesion. Sinuses/Orbits: No middle ear or mastoid effusion. Paranasal sinuses are notable for trace mucosal thickening of the floor of the right maxillary sinus. Orbits are unremarkable. Other: None. CT CERVICAL, THORACIC, LUMBAR SPINE FINDINGS Alignment: Straightening of the normal cervical lordosis. Skull base and vertebrae: Cervical spine - no acute fracture. No primary bone lesion or focal pathologic process. Thoracic spine - mild superior endplate height loss at T11 is favored to be secondary to Mckinzee Spirito Schmorl's node. Lumbar spine - compared to 03/21/22, there is Isreal Moline new mild superior endplate compression deformity at L4. Unchanged Schmorl's node at L3. Soft tissues and spinal canal: No prevertebral fluid or swelling. No visible canal hematoma. Disc levels:  No evidence of high-grade spinal canal  stenosis. Visualized chest: Negative. Please see same day CT chest abdomen and pelvis for additional findings, including Nicie Milan small left posterior diaphragmatic hernia, which is likely congenital. Other: There are few prominent cervical lymph nodes, which are nonspecific, and likely reactive. IMPRESSION: 1. No CT evidence of intracranial injury. 2. No acute fracture or traumatic listhesis in the cervical or thoracic spine 3. New mild superior endplate compression deformity at L4, favored to be acute. Electronically Signed   By: Marin Roberts M.D.   On: 06/25/2022 12:23   CT T-SPINE NO CHARGE  Result Date: 06/25/2022 CLINICAL DATA:  Fall EXAM: CT HEAD WITHOUT CONTRAST CT CERVICAL SPINE WITHOUT CONTRAST CT THORACIC SPINE WITHOUT CONTRAST CT LUMBAR  SPINE WITHOUT CONTRAST TECHNIQUE: Multidetector CT imaging of the head and cervical, thoracic, and lumbar spine was performed following the standard protocol without intravenous contrast. Multiplanar CT image reconstructions of the cervical spine were also generated. RADIATION DOSE REDUCTION: This exam was performed according to the departmental dose-optimization program which includes automated exposure control, adjustment of the mA and/or kV according to patient size and/or use of iterative reconstruction technique. COMPARISON:  None Available. FINDINGS: CT HEAD FINDINGS Brain: No evidence of acute infarction, hemorrhage, hydrocephalus, extra-axial collection or mass lesion/mass effect. Vascular: No hyperdense vessel or unexpected calcification. Skull: Normal. Negative for fracture or focal lesion. Sinuses/Orbits: No middle ear or mastoid effusion. Paranasal sinuses are notable for trace mucosal thickening of the floor of the right maxillary sinus. Orbits are unremarkable. Other: None. CT CERVICAL, THORACIC, LUMBAR SPINE FINDINGS Alignment: Straightening of the normal cervical lordosis. Skull base and vertebrae: Cervical spine - no acute fracture. No primary bone lesion  or focal pathologic process. Thoracic spine - mild superior endplate height loss at T11 is favored to be secondary to Maykayla Highley Schmorl's node. Lumbar spine - compared to 03/21/22, there is Avelyn Touch new mild superior endplate compression deformity at L4. Unchanged Schmorl's node at L3. Soft tissues and spinal canal: No prevertebral fluid or swelling. No visible canal hematoma. Disc levels:  No evidence of high-grade spinal canal stenosis. Visualized chest: Negative. Please see same day CT chest abdomen and pelvis for additional findings, including Maevis Mumby small left posterior diaphragmatic hernia, which is likely congenital. Other: There are few prominent cervical lymph nodes, which are nonspecific, and likely reactive. IMPRESSION: 1. No CT evidence of intracranial injury. 2. No acute fracture or traumatic listhesis in the cervical or thoracic spine 3. New mild superior endplate compression deformity at L4, favored to be  acute. Electronically Signed   By: Marin Roberts M.D.   On: 06/25/2022 12:23   CT L-SPINE NO CHARGE  Result Date: 06/25/2022 CLINICAL DATA:  Fall EXAM: CT HEAD WITHOUT CONTRAST CT CERVICAL SPINE WITHOUT CONTRAST CT THORACIC SPINE WITHOUT CONTRAST CT LUMBAR  SPINE WITHOUT CONTRAST TECHNIQUE: Multidetector CT imaging of the head and cervical, thoracic, and lumbar spine was performed following the standard protocol without intravenous contrast. Multiplanar CT image reconstructions of the cervical spine were also generated. RADIATION DOSE REDUCTION: This exam was performed according to the departmental dose-optimization program which includes automated exposure control, adjustment of the mA and/or kV according to patient size and/or use of iterative reconstruction technique. COMPARISON:  None Available. FINDINGS: CT HEAD FINDINGS Brain: No evidence of acute infarction, hemorrhage, hydrocephalus, extra-axial collection or mass lesion/mass effect. Vascular: No hyperdense vessel or unexpected calcification. Skull: Normal.  Negative for fracture or focal lesion. Sinuses/Orbits: No middle ear or mastoid effusion. Paranasal sinuses are notable for trace mucosal thickening of the floor of the right maxillary sinus. Orbits are unremarkable. Other: None. CT CERVICAL, THORACIC, LUMBAR SPINE FINDINGS Alignment: Straightening of the normal cervical lordosis. Skull base and vertebrae: Cervical spine - no acute fracture. No primary bone lesion or focal pathologic process. Thoracic spine - mild superior endplate height loss at T11 is favored to be secondary to [place]na Buckbee Schmorl's node. Lumbar spine - compared to 03/21/22, there is Penney Domanski new mild superior endplate compression deformity at L4. Unchanged Schmorl's node at L3. Soft tissues and spinal canal: No prevertebral fluid or swelling. No visible canal hematoma. Disc levels:  No evidence of high-grade spinal canal stenosis. Visualized chest: Negative. Please see same day CT chest abdomen and pelvis for additional findings, including Timeka Goette small left posterior diaphragmatic hernia, which is likely congenital. Other: There are few prominent cervical lymph nodes, which are nonspecific, and likely reactive. IMPRESSION: 1. No CT evidence of intracranial injury. 2. No acute fracture or traumatic listhesis in the cervical or thoracic spine 3. New mild superior endplate compression deformity at L4, favored to be acute. Electronically Signed   By: Marin Roberts M.D.   On: 06/25/2022 12:23   CT CHEST ABDOMEN PELVIS W CONTRAST  Result Date: 06/25/2022 CLINICAL DATA:  52 year old female status post two falls recently. Pain, nausea vomiting. EXAM: CT CHEST, ABDOMEN, AND PELVIS WITH CONTRAST TECHNIQUE: Multidetector CT imaging of the chest, abdomen and pelvis was performed following the standard protocol during bolus administration of intravenous contrast. RADIATION DOSE REDUCTION: This exam was performed according to the departmental dose-optimization program which includes automated exposure control, adjustment of the  mA and/or kV according to patient size and/or use of iterative reconstruction technique. CONTRAST:  13mL OMNIPAQUE IOHEXOL 300 MG/ML  SOLN COMPARISON:  CTA chest 03/03/2022. CT thoracic and lumbar spine today reported separately. CT Abdomen and Pelvis 05/21/2022. FINDINGS: CT CHEST FINDINGS Cardiovascular: Negative. No pericardial effusion. No significant atherosclerosis identified. Mediastinum/Nodes: Negative. No mediastinal hematoma or lymphadenopathy. Lungs/Pleura: Major airways are patent. Similar lung volumes to last year. Chronic elevation of the right hemidiaphragm. Mild linear right lung base scarring/atelectasis. No pneumothorax, pleural effusion, pulmonary contusion, or other abnormal lung opacity. Musculoskeletal: Thoracic spine is detailed separately. Visible shoulder osseous structures appear intact. No acute sternal fracture. Chronic rib fractures (such as right anterior 3rd rib). No acute rib fracture identified. CT ABDOMEN PELVIS FINDINGS Hepatobiliary: Chronic fatty liver. Liver and gallbladder appear intact. No perihepatic fluid. No bile duct enlargement. Pancreas: Partial atrophy. Spleen: Negative. Adrenals/Urinary Tract: Negative. Stomach/Bowel: Negative large bowel,  much of the large bowel appears stable and negative, including retrocecal appendix (series 2, image 91). Conspicuous caliber change in the ascending colon along the 3 cm segment on coronal image 62 is new compared to January and probably peristalsis artifact. Negative terminal ileum. No dilated small bowel. Stomach and duodenum appear negative. No free air or free fluid identified. Vascular/Lymphatic: Calcified aortic atherosclerosis. Major arterial structures appear patent and intact. Portal venous system is patent. No lymphadenopathy identified. Reproductive: Negative. Other: No pelvis free fluid. Musculoskeletal: Lumbar spine detailed separately., and Sacrum, SI joints, pelvis (chronic inferior pubic rami fractures) proximal  femurs appear stable and intact. No superficial soft tissue injury identified. IMPRESSION: 1. No acute traumatic injury identified in the chest, abdomen, or pelvis. See Thoracic and Lumbar Spine CT reported separately. 2. Conspicuous caliber change in the ascending colon, but that same segment appeared negative in January, so suspect peristalsis artifact. Recommend up-to-date colon cancer screening for this patient. 3. Fatty liver. Electronically Signed   By: Genevie Ann M.D.   On: 06/25/2022 12:21    Microbiology: Recent Results (from the past 240 hour(s))  Culture, blood (routine x 2)     Status: None (Preliminary result)   Collection Time: 07/11/22  9:40 PM   Specimen: BLOOD  Result Value Ref Range Status   Specimen Description   Final    BLOOD LEFT ANTECUBITAL Performed at [facility] [address], [place], [place] [address]    Special Requests   Final    BOTTLES DRAWN AEROBIC AND ANAEROBIC Blood Culture adequate volume Performed at [facility] [address], [place], [place] [address]    Culture   Final    NO GROWTH 2 DAYS Performed at [facility] [address], [place], [place] [address]    Report Status PENDING  Incomplete  Culture, blood (routine x 2)     Status: None (Preliminary result)   Collection Time: 07/12/22  9:51 AM   Specimen: BLOOD  Result Value Ref Range Status   Specimen Description   Final    BLOOD RIGHT ANTECUBITAL Performed at [facility] [address] [place], [place] [address]    Special Requests   Final    BOTTLES DRAWN AEROBIC ONLY Blood Culture results may not be optimal due to an inadequate volume of blood received in culture bottles Performed at [facility] [address] [place], [place] [address]    Culture   Final    NO GROWTH 2 DAYS Performed at [facility] [address] [place], [place] [address]    Report Status PENDING  Incomplete      Labs: Basic Metabolic Panel: Recent Labs  Lab 07/11/22 2040 07/12/22 0418 07/12/22 0813 07/13/22 0704 07/14/22 0612  NA 141 142  --  136 140  K 4.1 3.3*  --  3.7 4.0  CL 107 110  --  106 108  CO2 21* 22  --  22 26  GLUCOSE 107* 105*  --  99 109*  BUN <5* <5*  --  <5* 6  CREATININE 0.92 0.79  --  0.62 0.59  CALCIUM 8.8* 8.2*  --  8.0* 7.9*  MG  --   --  1.9 1.8 1.8  PHOS  --   --   --  3.1 3.4   Liver Function Tests: Recent Labs  Lab 07/11/22 2040 07/13/22 0704 07/14/22 0612  AST 31 33 33  ALT 23 20 21  ALKPHOS 138* 102 97  BILITOT 0.8 0.5 0.7  PROT 7.1 5.6* 5.3*  ALBUMIN 3.0* 2.3* 2.1*   Recent Labs  Lab 07/11/22 2040  LIPASE 19   No results for input(s): "AMMONIA" in the last 168 hours. CBC: Recent Labs  Lab 07/11/22 2040 07/12/22 0418 07/13/22 0704 07/14/22 0612  WBC 8.5 9.5 7.0 7.0  NEUTROABS 7.0 7.5 3.6 4.3  HGB 12.8 11.4* 10.6* 10.7*  HCT 39.7 35.2* 36.1 34.3*  MCV 95.9 94.9 104.3* 98.8  PLT 356 375 253 285   Cardiac Enzymes: No results for input(s): "CKTOTAL", "CKMB", "CKMBINDEX", "TROPONINI" in the last 168 hours. BNP: BNP (last 3 results) Recent Labs    06/30/22 0336  BNP 394.5*    ProBNP (last 3 results) No results for input(s): "PROBNP" in the last 8760 hours.  CBG: Recent Labs  Lab 07/14/22 0040 07/14/22 0103 07/14/22 0425 07/14/22 0742 07/14/22 1120  GLUCAP 61* 82 99 117* 145*       Signed:  Fayrene Helper MD.  [facility] 07/14/2022, 11:57 AM

## 2022-07-16 ENCOUNTER — Ambulatory Visit: Payer: Medicare Other | Attending: Physician Assistant

## 2022-07-16 ENCOUNTER — Other Ambulatory Visit: Payer: Self-pay | Admitting: Physician Assistant

## 2022-07-16 DIAGNOSIS — I4711 Inappropriate sinus tachycardia, so stated: Secondary | ICD-10-CM

## 2022-07-16 DIAGNOSIS — I495 Sick sinus syndrome: Secondary | ICD-10-CM

## 2022-07-16 NOTE — Progress Notes (Unsigned)
Enrolled for [employer] to mail a ZIO XT long term holter monitor to the patients address on file.   Dr. Berry to read. 

## 2022-07-17 LAB — CULTURE, BLOOD (ROUTINE X 2)
Culture: NO GROWTH
Culture: NO GROWTH
Special Requests: ADEQUATE

## 2022-07-18 ENCOUNTER — Encounter (HOSPITAL_COMMUNITY): Payer: Self-pay

## 2022-07-18 ENCOUNTER — Emergency Department (HOSPITAL_COMMUNITY): Payer: Medicare Other

## 2022-07-18 ENCOUNTER — Other Ambulatory Visit: Payer: Self-pay

## 2022-07-18 ENCOUNTER — Inpatient Hospital Stay (HOSPITAL_COMMUNITY)
Admission: EM | Admit: 2022-07-18 | Discharge: 2022-07-20 | DRG: 394 | Disposition: A | Discharge status: Home / Self Care | Payer: Medicare Other | Attending: Internal Medicine | Admitting: Internal Medicine

## 2022-07-18 DIAGNOSIS — R300 Dysuria: Secondary | ICD-10-CM | POA: Diagnosis present

## 2022-07-18 DIAGNOSIS — E785 Hyperlipidemia, unspecified: Secondary | ICD-10-CM | POA: Diagnosis present

## 2022-07-18 DIAGNOSIS — K2270 Barrett's esophagus without dysplasia: Secondary | ICD-10-CM | POA: Diagnosis present

## 2022-07-18 DIAGNOSIS — Z83719 Family history of colon polyps, unspecified: Secondary | ICD-10-CM

## 2022-07-18 DIAGNOSIS — M159 Polyosteoarthritis, unspecified: Secondary | ICD-10-CM | POA: Diagnosis present

## 2022-07-18 DIAGNOSIS — Z7984 Long term (current) use of oral hypoglycemic drugs: Secondary | ICD-10-CM

## 2022-07-18 DIAGNOSIS — F4310 Post-traumatic stress disorder, unspecified: Secondary | ICD-10-CM | POA: Diagnosis present

## 2022-07-18 DIAGNOSIS — I2510 Atherosclerotic heart disease of native coronary artery without angina pectoris: Secondary | ICD-10-CM | POA: Diagnosis present

## 2022-07-18 DIAGNOSIS — G931 Anoxic brain damage, not elsewhere classified: Secondary | ICD-10-CM | POA: Diagnosis present

## 2022-07-18 DIAGNOSIS — R160 Hepatomegaly, not elsewhere classified: Secondary | ICD-10-CM | POA: Diagnosis present

## 2022-07-18 DIAGNOSIS — E119 Type 2 diabetes mellitus without complications: Secondary | ICD-10-CM

## 2022-07-18 DIAGNOSIS — Z85828 Personal history of other malignant neoplasm of skin: Secondary | ICD-10-CM

## 2022-07-18 DIAGNOSIS — F418 Other specified anxiety disorders: Secondary | ICD-10-CM | POA: Diagnosis present

## 2022-07-18 DIAGNOSIS — M5030 Other cervical disc degeneration, unspecified cervical region: Secondary | ICD-10-CM | POA: Diagnosis present

## 2022-07-18 DIAGNOSIS — R1011 Right upper quadrant pain: Secondary | ICD-10-CM | POA: Diagnosis not present

## 2022-07-18 DIAGNOSIS — R1115 Cyclical vomiting syndrome unrelated to migraine: Secondary | ICD-10-CM | POA: Diagnosis not present

## 2022-07-18 DIAGNOSIS — R9431 Abnormal electrocardiogram [ECG] [EKG]: Secondary | ICD-10-CM

## 2022-07-18 DIAGNOSIS — M4856XA Collapsed vertebra, not elsewhere classified, lumbar region, initial encounter for fracture: Secondary | ICD-10-CM | POA: Diagnosis present

## 2022-07-18 DIAGNOSIS — S32040A Wedge compression fracture of fourth lumbar vertebra, initial encounter for closed fracture: Secondary | ICD-10-CM | POA: Diagnosis present

## 2022-07-18 DIAGNOSIS — Z803 Family history of malignant neoplasm of breast: Secondary | ICD-10-CM

## 2022-07-18 DIAGNOSIS — Z8674 Personal history of sudden cardiac arrest: Secondary | ICD-10-CM

## 2022-07-18 DIAGNOSIS — Z79890 Hormone replacement therapy (postmenopausal): Secondary | ICD-10-CM

## 2022-07-18 DIAGNOSIS — F319 Bipolar disorder, unspecified: Secondary | ICD-10-CM | POA: Diagnosis present

## 2022-07-18 DIAGNOSIS — K760 Fatty (change of) liver, not elsewhere classified: Secondary | ICD-10-CM | POA: Diagnosis present

## 2022-07-18 DIAGNOSIS — I4711 Inappropriate sinus tachycardia, so stated: Secondary | ICD-10-CM | POA: Diagnosis present

## 2022-07-18 DIAGNOSIS — Z833 Family history of diabetes mellitus: Secondary | ICD-10-CM

## 2022-07-18 DIAGNOSIS — Z79899 Other long term (current) drug therapy: Secondary | ICD-10-CM

## 2022-07-18 DIAGNOSIS — I10 Essential (primary) hypertension: Secondary | ICD-10-CM | POA: Diagnosis present

## 2022-07-18 DIAGNOSIS — G894 Chronic pain syndrome: Secondary | ICD-10-CM | POA: Diagnosis present

## 2022-07-18 DIAGNOSIS — E039 Hypothyroidism, unspecified: Secondary | ICD-10-CM | POA: Diagnosis present

## 2022-07-18 DIAGNOSIS — Z87440 Personal history of urinary (tract) infections: Secondary | ICD-10-CM

## 2022-07-18 DIAGNOSIS — R825 Elevated urine levels of drugs, medicaments and biological substances: Secondary | ICD-10-CM | POA: Insufficient documentation

## 2022-07-18 DIAGNOSIS — R1084 Generalized abdominal pain: Secondary | ICD-10-CM | POA: Diagnosis present

## 2022-07-18 HISTORY — DX: Cardiac arrest, cause unspecified: I46.9

## 2022-07-18 HISTORY — DX: Atherosclerotic heart disease of native coronary artery without angina pectoris: I25.10

## 2022-07-18 LAB — COMPREHENSIVE METABOLIC PANEL
ALT: 35 U/L (ref 0–44)
AST: 45 U/L — ABNORMAL HIGH (ref 15–41)
Albumin: 3.1 g/dL — ABNORMAL LOW (ref 3.5–5.0)
Alkaline Phosphatase: 135 U/L — ABNORMAL HIGH (ref 38–126)
Anion gap: 16 — ABNORMAL HIGH (ref 5–15)
BUN: <5 mg/dL — ABNORMAL LOW (ref 6–20)
CO2: 24 mmol/L (ref 22–32)
Calcium: 9.2 mg/dL (ref 8.9–10.3)
Chloride: 103 mmol/L (ref 98–111)
Creatinine, Ser: 0.8 mg/dL (ref 0.44–1.00)
GFR, Estimated: >60 mL/min (ref >60)
Glucose, Bld: 135 mg/dL — ABNORMAL HIGH (ref 70–99)
Potassium: 3.6 mmol/L (ref 3.5–5.1)
Sodium: 143 mmol/L (ref 135–145)
Total Bilirubin: 0.7 mg/dL (ref 0.3–1.2)
Total Protein: 7.6 g/dL (ref 6.5–8.1)

## 2022-07-18 LAB — CBC
HCT: 43.8 % (ref 36.0–46.0)
Hemoglobin: 13.9 g/dL (ref 12.0–15.0)
MCH: 30.9 pg (ref 26.0–34.0)
MCHC: 31.7 g/dL (ref 30.0–36.0)
MCV: 97.3 fL (ref 80.0–100.0)
Platelets: 468 10*3/uL — ABNORMAL HIGH (ref 150–400)
RBC: 4.5 MIL/uL (ref 3.87–5.11)
RDW: 17.2 % — ABNORMAL HIGH (ref 11.5–15.5)
WBC: 10.4 10*3/uL (ref 4.0–10.5)
nRBC: 0 % (ref 0.0–0.2)

## 2022-07-18 LAB — I-STAT BETA HCG BLOOD, ED (MC, WL, AP ONLY): I-stat hCG, quantitative: <5 m[IU]/mL (ref <5)

## 2022-07-18 LAB — LIPASE, BLOOD: Lipase: 18 U/L (ref 11–51)

## 2022-07-18 LAB — GLUCOSE, CAPILLARY: Glucose-Capillary: 94 mg/dL (ref 70–99)

## 2022-07-18 LAB — MAGNESIUM: Magnesium: 1.9 mg/dL (ref 1.7–2.4)

## 2022-07-18 MED ORDER — IOHEXOL 300 MG/ML  SOLN
100.0000 mL | Freq: Once | INTRAMUSCULAR | Status: AC | PRN
  Administered 2022-07-18: 100 mL via INTRAVENOUS

## 2022-07-18 MED ORDER — METOPROLOL SUCCINATE ER 25 MG PO TB24
25.0000 mg | ORAL_TABLET | Freq: Every day | ORAL | Status: DC
  Administered 2022-07-18 – 2022-07-19 (×2): 25 mg via ORAL
  Filled 2022-07-18 (×3): qty 1

## 2022-07-18 MED ORDER — DICYCLOMINE HCL 10 MG/ML IM SOLN
20.0000 mg | Freq: Once | INTRAMUSCULAR | Status: AC
  Administered 2022-07-18: 20 mg via INTRAMUSCULAR
  Filled 2022-07-18: qty 2

## 2022-07-18 MED ORDER — PANTOPRAZOLE SODIUM 40 MG IV SOLR
40.0000 mg | Freq: Two times a day (BID) | INTRAVENOUS | Status: DC
  Administered 2022-07-18 – 2022-07-19 (×3): 40 mg via INTRAVENOUS
  Filled 2022-07-18 (×3): qty 10

## 2022-07-18 MED ORDER — LORAZEPAM 2 MG/ML IJ SOLN
0.5000 mg | Freq: Once | INTRAMUSCULAR | Status: AC
  Administered 2022-07-18: 0.5 mg via INTRAVENOUS
  Filled 2022-07-18: qty 1

## 2022-07-18 MED ORDER — LEVOTHYROXINE SODIUM 50 MCG PO TABS
50.0000 ug | ORAL_TABLET | Freq: Every day | ORAL | Status: DC
  Administered 2022-07-19 – 2022-07-20 (×2): 50 ug via ORAL
  Filled 2022-07-18 (×2): qty 1

## 2022-07-18 MED ORDER — METOCLOPRAMIDE HCL 5 MG/ML IJ SOLN
5.0000 mg | Freq: Three times a day (TID) | INTRAMUSCULAR | Status: DC
  Administered 2022-07-18 – 2022-07-20 (×5): 5 mg via INTRAVENOUS
  Filled 2022-07-18 (×5): qty 2

## 2022-07-18 MED ORDER — ACETAMINOPHEN 650 MG RE SUPP: 650.0000 mg | Freq: Four times a day (QID) | RECTAL | Status: DC | PRN

## 2022-07-18 MED ORDER — ACETAMINOPHEN 325 MG PO TABS: 650.0000 mg | ORAL_TABLET | Freq: Four times a day (QID) | ORAL | Status: DC | PRN

## 2022-07-18 MED ORDER — FLUOXETINE HCL 20 MG PO CAPS
40.0000 mg | ORAL_CAPSULE | Freq: Every day | ORAL | Status: DC
  Administered 2022-07-19 – 2022-07-20 (×2): 40 mg via ORAL
  Filled 2022-07-18 (×2): qty 2

## 2022-07-18 MED ORDER — ONDANSETRON HCL 4 MG/2ML IJ SOLN
4.0000 mg | Freq: Once | INTRAMUSCULAR | Status: AC
  Administered 2022-07-18: 4 mg via INTRAVENOUS
  Filled 2022-07-18: qty 2

## 2022-07-18 MED ORDER — METOCLOPRAMIDE HCL 5 MG/ML IJ SOLN
10.0000 mg | Freq: Once | INTRAMUSCULAR | Status: AC
  Administered 2022-07-18: 10 mg via INTRAVENOUS
  Filled 2022-07-18: qty 2

## 2022-07-18 MED ORDER — TRAZODONE HCL 100 MG PO TABS
150.0000 mg | ORAL_TABLET | Freq: Every day | ORAL | Status: DC
  Administered 2022-07-18 – 2022-07-19 (×2): 150 mg via ORAL
  Filled 2022-07-18 (×2): qty 2

## 2022-07-18 MED ORDER — MORPHINE SULFATE (PF) 2 MG/ML IV SOLN
1.0000 mg | INTRAVENOUS | Status: DC | PRN
  Administered 2022-07-18 – 2022-07-19 (×4): 1 mg via INTRAVENOUS
  Filled 2022-07-18 (×4): qty 1

## 2022-07-18 MED ORDER — ENOXAPARIN SODIUM 40 MG/0.4ML IJ SOSY
40.0000 mg | PREFILLED_SYRINGE | INTRAMUSCULAR | Status: DC
  Administered 2022-07-18 – 2022-07-19 (×2): 40 mg via SUBCUTANEOUS
  Filled 2022-07-18 (×2): qty 0.4

## 2022-07-18 MED ORDER — TIZANIDINE HCL 4 MG PO TABS
12.0000 mg | ORAL_TABLET | Freq: Three times a day (TID) | ORAL | Status: DC | PRN
  Administered 2022-07-18 – 2022-07-20 (×4): 12 mg via ORAL
  Filled 2022-07-18 (×4): qty 3

## 2022-07-18 MED ORDER — PRAZOSIN HCL 1 MG PO CAPS
2.0000 mg | ORAL_CAPSULE | Freq: Every day | ORAL | Status: DC
  Administered 2022-07-18 – 2022-07-19 (×2): 2 mg via ORAL
  Filled 2022-07-18 (×2): qty 2

## 2022-07-18 MED ORDER — OXYCODONE-ACETAMINOPHEN 7.5-325 MG PO TABS
1.0000 | ORAL_TABLET | Freq: Four times a day (QID) | ORAL | Status: DC | PRN
  Administered 2022-07-18 – 2022-07-20 (×4): 1 via ORAL
  Filled 2022-07-18 (×4): qty 1

## 2022-07-18 MED ORDER — SODIUM CHLORIDE 0.9 % IV BOLUS
1000.0000 mL | Freq: Once | INTRAVENOUS | Status: AC
  Administered 2022-07-18: 1000 mL via INTRAVENOUS

## 2022-07-18 MED ORDER — METOCLOPRAMIDE HCL 5 MG/ML IJ SOLN: 10.0000 mg | Freq: Two times a day (BID) | INTRAMUSCULAR | Status: DC | PRN

## 2022-07-18 MED ORDER — RENA-VITE PO TABS
1.0000 | ORAL_TABLET | Freq: Every day | ORAL | Status: DC
  Administered 2022-07-18 – 2022-07-19 (×2): 1 via ORAL
  Filled 2022-07-18 (×2): qty 1

## 2022-07-18 MED ORDER — SODIUM CHLORIDE 0.9 % IV SOLN: INTRAVENOUS | Status: DC

## 2022-07-18 MED ORDER — LIDOCAINE 5 % EX PTCH
1.0000 | MEDICATED_PATCH | CUTANEOUS | Status: DC
  Administered 2022-07-18 – 2022-07-19 (×2): 1 via TRANSDERMAL
  Filled 2022-07-18 (×2): qty 1

## 2022-07-18 MED ORDER — MORPHINE SULFATE (PF) 4 MG/ML IV SOLN
4.0000 mg | Freq: Once | INTRAVENOUS | Status: AC
  Administered 2022-07-18: 4 mg via INTRAVENOUS
  Filled 2022-07-18: qty 1

## 2022-07-18 MED ORDER — SODIUM CHLORIDE 0.9 % IV SOLN: 12.5000 mg | Freq: Four times a day (QID) | INTRAVENOUS | Status: DC | PRN

## 2022-07-18 MED ORDER — ATORVASTATIN CALCIUM 10 MG PO TABS
10.0000 mg | ORAL_TABLET | Freq: Every evening | ORAL | Status: DC
  Administered 2022-07-18 – 2022-07-19 (×2): 10 mg via ORAL
  Filled 2022-07-18 (×2): qty 1

## 2022-07-18 MED ORDER — ARIPIPRAZOLE 5 MG PO TABS
5.0000 mg | ORAL_TABLET | Freq: Every day | ORAL | Status: DC
  Administered 2022-07-19 – 2022-07-20 (×2): 5 mg via ORAL
  Filled 2022-07-18 (×2): qty 1

## 2022-07-18 MED ORDER — INSULIN ASPART 100 UNIT/ML IJ SOLN
0.0000 [IU] | Freq: Three times a day (TID) | INTRAMUSCULAR | Status: DC
  Administered 2022-07-19 (×3): 1 [IU] via SUBCUTANEOUS
  Filled 2022-07-18: qty 0.09

## 2022-07-18 NOTE — ED Triage Notes (Signed)
C/o n/v that started PTA with Right sided abd pain.  Denies headache, sore throat, bodyaches.

## 2022-07-18 NOTE — ED Notes (Signed)
Patient requesting more pain meds PA notified no new orders at this time.

## 2022-07-18 NOTE — Assessment & Plan Note (Signed)
Continue metoprolol 25 mg daily.  

## 2022-07-18 NOTE — Assessment & Plan Note (Addendum)
Continue protonix  

## 2022-07-18 NOTE — Assessment & Plan Note (Addendum)
-   Home regimen resumed 

## 2022-07-18 NOTE — Assessment & Plan Note (Signed)
Repeat TSH/free T4 Continue home synthroid.

## 2022-07-18 NOTE — Assessment & Plan Note (Addendum)
-   No obvious precipitating factors.  Patient referred for outpatient gastroparesis study but already does small multiple meals throughout day -Home antiemetic regimen does provide relief typically -Treated with fluids and antiemetics.  Diet advanced successfully to solids prior to discharge and was tolerated well

## 2022-07-18 NOTE — Assessment & Plan Note (Signed)
Continue prozac 40mg  daily and trazodone at bedtime

## 2022-07-18 NOTE — H&P (Signed)
History and Physical    Patient: [name] [id number] DOB: 14-Mar-1971 DOA: 07/18/2022 DOS: the patient was seen and examined on 07/18/2022 PCP: Ferd Hibbs, NP  Patient coming from: Home - lives with her husband and their sons. Uses walker at home.    Chief Complaint: abdominal pain and N/V  HPI: [name] is a 52 y.o. female with medical history significant of MI with cardiac arrest in 05/2020 which led to anoxic brain injury,  hypothyroidism, T2DM, depression, HTN, HLD, PTSD, bipolar d/o,  DDD/chronic low back pain, barrett's esophagus who presented to ED with complaints of abdominal pain and vomiting. Just admitted for nausea/vomiting on 07/11/2022. She has multiple admissions in the past for cyclic vomiting and has same complaints today. She states she had nausea and vomiting that started yesterday and she threw up about 12 times. Was fine through the night and then started to have vomiting today so came to ED. She has associated abdominal pain in her lower right abdomen. No diarrhea. No fever/chills or sick contacts.    Denies any fever/chills, vision changes/headaches, chest pain or palpitations, shortness of breath or cough, or leg swelling.   She does complain of burning when she urinates, frequency and urgency. Her husband reports she gets frequent UTIs and has been on multiple rounds of abx.   She does not smoke or drink alcohol.   ER Course:  vitals: afebrile, bp: 137/92, HR: 102, RR: 20, oxygen: 98%on RA Pertinent labs: AG: 16,  CT abdomen/pelvis: 1. Mild hepatomegaly and extensive hepatic steatosis. No liver mass. Appearance is similar to the prior CT. 2. No acute findings within the abdomen or pelvis. Normal appendix visualized. No bowel obstruction or inflammation. 3. Mild aortic atherosclerosis. US liver: stable increased liver echotexture consistent with steatosis.  In ED: given 1L IVF bolus, zofran, reglan, ativan, bentyl and TRH asked to admit.      Review of Systems: As mentioned in the history of present illness. All other systems reviewed and are negative. Past Medical History:  Diagnosis Date   Anxiety    Arthritis    "joints ache all over" (10/15/2014)   Barrett's esophagus    Bulging lumbar disc    Cardiac arrest (HCC)    Chronic lower back pain    Coronary artery disease    DDD (degenerative disc disease), cervical    Depression    DM (diabetes mellitus) ([place])    Drug-seeking behavior    Headache    "weekly" (10/15/2014)   Hyperlipemia    Hypertension    PTSD (post-traumatic stress disorder)    Skin cancer    "had them cut off my arms; don't know what kind"   Past Surgical History:  Procedure Laterality Date   ABLATION ON ENDOMETRIOSIS  2008   BIOPSY  12/27/2018   Procedure: BIOPSY;  Surgeon: Thornton Park, MD;  Location: [facility] ENDOSCOPY;  Service: Gastroenterology;;   BIOPSY  10/05/2021   Procedure: BIOPSY;  Surgeon: Gatha Mayer, MD;  [facility] ENDOSCOPY;  Service: Gastroenterology;;   ESOPHAGOGASTRODUODENOSCOPY (EGD) WITH PROPOFOL N/A 12/27/2018   Procedure: ESOPHAGOGASTRODUODENOSCOPY (EGD) WITH PROPOFOL;  Surgeon: Thornton Park, MD;  Location: [facility] ENDOSCOPY;  Service: Gastroenterology;  Laterality: N/A;   ESOPHAGOGASTRODUODENOSCOPY (EGD) WITH PROPOFOL N/A 10/05/2021   Procedure: ESOPHAGOGASTRODUODENOSCOPY (EGD) WITH PROPOFOL;  Surgeon: Gatha Mayer, MD;  Location: [place];  Service: Gastroenterology;  Laterality: N/A;   HEMORRHOID SURGERY  ~ 2002   IR FLUORO GUIDE CV LINE RIGHT  06/14/2020   IR  REMOVAL TUN CV CATH W/O FL  06/23/2020   IR US GUIDE VASC ACCESS RIGHT  06/14/2020   ORIF ANKLE FRACTURE Right 03/28/2020   Procedure: OPEN REDUCTION INTERNAL FIXATION (ORIF) RIGHT BIMALLEOLAR ANKLE FRACTURE;  Surgeon: Marchia Bond, MD;  Location: [place];  Service: Orthopedics;  Laterality: Right;   Social History:  reports that she has never smoked. She has never used smokeless  tobacco. She reports that she does not currently use alcohol. She reports that she does not use drugs.  No Known Allergies  Family History  Problem Relation Age of Onset   Breast cancer Mother    Diabetes Mother    Breast cancer Maternal Grandmother    Breast cancer Paternal Grandmother    Colon polyps Paternal Grandmother    Colon cancer Neg Hx    Esophageal cancer Neg Hx    Rectal cancer Neg Hx    Stomach cancer Neg Hx     Prior to Admission medications   Medication Sig Start Date End Date Taking? Authorizing Provider  acetaminophen (TYLENOL) 500 MG tablet Take 1,000 mg by mouth as needed for mild pain or headache.    [provider]  ARIPiprazole (ABILIFY) 5 MG tablet Take 1 tablet (5 mg total) by mouth daily. 08/01/20   Jamse Arn, MD  atorvastatin (LIPITOR) 10 MG tablet Take 10 mg by mouth every evening. 08/26/20   [provider]  FLUoxetine (PROZAC) 40 MG capsule Take 1 capsule (40 mg total) by mouth daily. 07/01/20   Angiulli, Lavon Paganini, PA-C  ibuprofen (ADVIL) 200 MG tablet Take 400 mg by mouth every 6 (six) hours as needed for moderate pain.    [provider]  levothyroxine (SYNTHROID) 50 MCG tablet Take 50 mcg by mouth daily before breakfast.    [provider]  lidocaine (LIDODERM) 5 % Place 1 patch onto the skin daily. Remove & Discard patch within 12 hours or as directed by MD 07/01/22   Shalhoub, Sherryll Burger, MD  metFORMIN (GLUCOPHAGE) 500 MG tablet Take 500 mg by mouth 2 (two) times daily with a meal.    [provider]  metoCLOPramide (REGLAN) 5 MG tablet Take 5 mg by mouth 2 (two) times daily. 07/10/22   [provider]  metoprolol succinate (TOPROL-XL) 25 MG 24 hr tablet Take 1 tablet (25 mg total) by mouth daily. Follow up with your PCP regarding your heart rate and whether this should be continued. 07/14/22 08/13/22  Elodia Florence., MD  multivitamin (RENA-VIT) TABS tablet Take 1 tablet by mouth at bedtime.  08/01/20   Jamse Arn, MD  ondansetron (ZOFRAN-ODT) 8 MG disintegrating tablet Take 1 tablet (8 mg total) by mouth every 8 (eight) hours as needed for nausea or vomiting. Patient taking differently: Take 8 mg by mouth as needed for nausea or vomiting. 05/03/22   Thornton Park, MD  oxyCODONE-acetaminophen (PERCOCET) 7.5-325 MG tablet Take 1 tablet by mouth 4 (four) times daily.    [provider]  pantoprazole (PROTONIX) 40 MG tablet Take 1 tablet (40 mg total) by mouth 2 (two) times daily. 05/03/22 05/03/23  Thornton Park, MD  prazosin (MINIPRESS) 2 MG capsule Take 1 capsule (2 mg total) by mouth at bedtime. 08/01/20   Jamse Arn, MD  promethazine (PHENERGAN) 25 MG suppository Place 1 suppository (25 mg total) rectally every 6 (six) hours as needed for nausea or vomiting. Patient taking differently: Place 25 mg rectally 2 (two) times daily as needed for  nausea or vomiting. 12/27/21   Drenda Freeze, MD  promethazine (PHENERGAN) 25 MG tablet Take 25 mg by mouth as needed for nausea or vomiting.    [provider]  tiZANidine (ZANAFLEX) 4 MG tablet Take 3 tablets (12 mg total) by mouth every 8 (eight) hours as needed for up to 7 days for muscle spasms. 07/14/22 07/21/22  Elodia Florence., MD  traZODone (DESYREL) 150 MG tablet Take 150 mg by mouth at bedtime.    [provider]    Physical Exam: Vitals:   07/18/22 1600 07/18/22 1610 07/18/22 1715 07/18/22 2045  BP: (!) 180/97  (!) 137/92 109/89  Pulse: (!) 114  (!) 102 98  Resp: 18  20 12   Temp:  98.3 F (36.8 C)  98.8 F (37.1 C)  TempSrc:  Oral  Oral  SpO2: 98%  98% 98%  Weight:      Height:       General:  Appears calm and comfortable and is in NAD Eyes:  PERRL, EOMI, normal lids, iris ENT:  grossly normal hearing, lips & tongue, mmm; appropriate dentition Neck:  no LAD, masses or thyromegaly; no carotid bruits Cardiovascular:  RRR, no m/r/g. No LE edema.  Respiratory:   CTA  bilaterally with no wheezes/rales/rhonchi.  Normal respiratory effort. Abdomen:  soft, generalized TTP, ND, NABS. NO rebound or guarding  Back:   normal alignment, no CVAT Skin:  no rash or induration seen on limited exam Musculoskeletal:  grossly normal tone BUE/BLE, good ROM, no bony abnormality Lower extremity:  No LE edema.  Limited foot exam with no ulcerations.  2+ distal pulses. Psychiatric:  grossly normal mood and affect (flat, at baseline), speech fluent and appropriate, AOx3 Neurologic:  CN 2-12 grossly intact, moves all extremities in coordinated fashion, sensation intact   Radiological Exams on Admission: Independently reviewed - see discussion in A/P where applicable  CT ABDOMEN PELVIS W CONTRAST  Result Date: 07/18/2022 CLINICAL DATA:  Right lower quadrant abdominal pain. Nausea vomiting. EXAM: CT ABDOMEN AND PELVIS WITH CONTRAST TECHNIQUE: Multidetector CT imaging of the abdomen and pelvis was performed using the standard protocol following bolus administration of intravenous contrast. RADIATION DOSE REDUCTION: This exam was performed according to the departmental dose-optimization program which includes automated exposure control, adjustment of the mA and/or kV according to patient size and/or use of iterative reconstruction technique. CONTRAST:  124mL OMNIPAQUE IOHEXOL 300 MG/ML  SOLN COMPARISON:  06/25/2022. FINDINGS: Lower chest: Small right effusion. Mild dependent right lung base opacity consistent with atelectasis. Hepatobiliary: Mild enlargement of the liver, 22 cm transversely. Diffuse decreased liver attenuation consistent with fatty infiltration. No liver mass or focal lesion. Normal gallbladder. No bile duct dilation. Pancreas: Mild pancreatic atrophy.  No mass or inflammation. Spleen: Normal in size without focal abnormality. Adrenals/Urinary Tract: Adrenal glands are unremarkable. Kidneys are normal, without renal calculi, focal lesion, or hydronephrosis. Bladder is  unremarkable. Stomach/Bowel: Normal stomach. Small bowel is normal in caliber. No wall thickening. No inflammation. Most of the colon is decompressed. No convincing wall thickening or inflammation. Normal appendix visualized. Vascular/Lymphatic: Mild aortic atherosclerosis. No aneurysm. No enlarged lymph nodes. Reproductive: Uterus and bilateral adnexa are unremarkable. Other: No abdominal wall hernia or abnormality. No abdominopelvic ascites. Musculoskeletal: Mild chronic depression of the upper endplate of L4 stable from the prior CT. No acute fracture. No bone lesion. IMPRESSION: 1. Mild hepatomegaly and extensive hepatic steatosis. No liver mass. Appearance is similar to the prior CT. 2. No acute findings within the  abdomen or pelvis. Normal appendix visualized. No bowel obstruction or inflammation. 3. Mild aortic atherosclerosis. Electronically Signed   By: Lajean Manes M.D.   On: 07/18/2022 18:25   US Abdomen Limited  Result Date: 07/18/2022 CLINICAL DATA:  Nausea and vomiting EXAM: ULTRASOUND ABDOMEN LIMITED RIGHT UPPER QUADRANT COMPARISON:  06/25/2022, 05/22/2022 FINDINGS: Gallbladder: No gallstones or wall thickening visualized. No sonographic Murphy sign noted by sonographer. Common bile duct: Diameter: 3 mm Liver: Diffuse increased liver echotexture consistent with hepatic steatosis, stable. No focal liver abnormalities. Portal vein is patent on color Doppler imaging with normal direction of blood flow towards the liver. Other: None. IMPRESSION: 1. Stable increased liver echotexture consistent with steatosis. 2. Otherwise unremarkable exam. Electronically Signed   By: Randa Ngo M.D.   On: 07/18/2022 13:43    EKG: Independently reviewed.  NSR with rate 88; nonspecific ST changes with no evidence of acute ischemia Borderline prolonged QT    Labs on Admission: I have personally reviewed the available labs and imaging studies at the time of the admission.  Pertinent labs:   AG: 16    Assessment and Plan: Principal Problem:   Cyclic vomiting syndrome Active Problems:   borderline prolonged QT interval   Dysuria   Abdominal pain, generalized   Compression fracture of fourth lumbar vertebra (HCC)   Chronic pain syndrome   Type 2 diabetes mellitus without complication, without long-term current use of insulin (HCC)   Barrett's esophagus   Inappropriate sinus tachycardia   Essential hypertension   Depression with anxiety   Bipolar affective disorder (HCC)   Hypothyroidism   PTSD (post-traumatic stress disorder)    Assessment and Plan: * Cyclic vomiting syndrome 52 year old with history of cyclic vomiting syndrome presenting with 1 day history of intractable N/V and abdominal pain  -obs to telemetry  -Scheduled reglan q8 hours and phenergan PRN for breakthrough.  Per patient Reglan and Phenergan has the greatest effect. -IV fluid rehydration -Continue as needed IV morphine.   -liquid diet, patient to advance as tolerated -just discharged on 3/23 so has not had any time to set up gastroparesis study or GI follow up  -UDS pending   borderline prolonged QT interval Optimize electrolytes Check magnesium  Keep on telemetry Avoid qt prolonging drugs (continuing Abilify since borderline and phenergan for refractory N/V only)  Repeat ekg in AM    Dysuria Per husband has had recurrent UTIs UA and culture pending   Abdominal pain, generalized Chronic abdominal pain CT abdomen with no acute changes/findings Continue home oxycodone and PRN morphine for break through pain   Compression fracture of fourth lumbar vertebra (HCC) Continue home percocet and lidocaine patch PRN IV morphine if she can't tolerate PO   Chronic pain syndrome On oxycodone 7.5mg  QID  Continue this, but IV morphine for break through or if can't tolerate PO  Last filled 3/19 for #120   Type 2 diabetes mellitus without complication, without long-term current use of insulin ([place]) Well  controlled. A1C of 5.3 in 04/2022  Hold metformin, consider taking her off with A1C so tightly controlled  SSI and accuchecks qac/hs   Barrett's esophagus Continue protonix 40mg  IV BID   Inappropriate sinus tachycardia Started on metop for inappropriate sinus tachy, but she's been bradycardic on recent admit a few days ago.  Metop reduced to 25 mg on her d/c a few days ago  Request ziopatch per last hospital d/c note  TSH/free T4 and magnesium pending   Essential hypertension Continue metoprolol  25mg  daily   Depression with anxiety Continue prozac 40mg  daily and trazodone at bedtime   Bipolar affective disorder (HCC) Continue abilify  Hypothyroidism Repeat TSH/free T4 Continue home synthroid.   PTSD (post-traumatic stress disorder) Continue prazosin daily     Advance Care Planning:   Code Status: Full Code   Consults: none   DVT Prophylaxis: lovenox   Family Communication: husband at bedside   Severity of Illness: The appropriate patient status for this patient is OBSERVATION. Observation status is judged to be reasonable and necessary in order to provide the required intensity of service to ensure the patient's safety. The patient's presenting symptoms, physical exam findings, and initial radiographic and laboratory data in the context of their medical condition is felt to place them at decreased risk for further clinical deterioration. Furthermore, it is anticipated that the patient will be medically stable for discharge from the hospital within 2 midnights of admission.   Author: Orma Flaming, MD 07/18/2022 8:54 PM  For on call review [email or web address].

## 2022-07-18 NOTE — ED Provider Notes (Signed)
Care assumed from Tracy Moras, PA-C at shift change pending symptomatic improvement and p.o. challenge.  See his note for full HPI.  In short, patient is a 52 year old female with history of cyclic vomiting who presents to the ED due to right-sided abdominal pain.  She admits to numerous episodes of nonbloody, nonbilious emesis since yesterday.  Numerous admissions for same.  Denies marijuana use.  No history of diabetes. Physical Exam  BP 139/67   Pulse (!) 102   Temp 99.2 F (37.3 C) (Oral)   Resp 20   Ht 5\' 4"  (1.626 m)   Wt 83.9 kg   LMP 06/03/2018 (Approximate) Comment: neg hcg 05/10/20  SpO2 100%   BMI 31.76 kg/m   Physical Exam Vitals and nursing note reviewed.  Constitutional:      General: She is not in acute distress.    Appearance: She is not ill-appearing.  HENT:     Head: Normocephalic.  Eyes:     Pupils: Pupils are equal, round, and reactive to light.  Cardiovascular:     Rate and Rhythm: Normal rate and regular rhythm.     Pulses: Normal pulses.     Heart sounds: Normal heart sounds. No murmur heard.    No friction rub. No gallop.  Pulmonary:     Effort: Pulmonary effort is normal.     Breath sounds: Normal breath sounds.  Abdominal:     General: Abdomen is flat. There is no distension.     Palpations: Abdomen is soft.     Tenderness: There is abdominal tenderness. There is no guarding or rebound.     Comments: +RLQ tenderness  Musculoskeletal:        General: Normal range of motion.     Cervical back: Neck supple.  Skin:    General: Skin is warm and dry.  Neurological:     General: No focal deficit present.     Mental Status: She is alert.  Psychiatric:        Mood and Affect: Mood normal.        Behavior: Behavior normal.     Procedures  Procedures  ED Course / MDM    Medical Decision Making Amount and/or Complexity of Data Reviewed Labs: ordered. Decision-making details documented in ED Course. Radiology: ordered and independent  interpretation performed. Decision-making details documented in ED Course.  Risk Prescription drug management. Decision regarding hospitalization.   Assumed care at shift change pending symptomatic relief.   Reassessed patient at bedside.  Patient admits to right lower quadrant abdominal pain.  Previous provider obtained right upper quadrant ultrasound which showed steatosis.  Tenderness to palpation right lower quadrant.  CT abdomen ordered to rule out evidence of appendicitis.  Patient denies vaginal symptoms.  She is not currently sexually active.  Denies concern for STIs.  6:43 PM reassessed patient at bedside.  Patient admits to continued pain and nausea.  Patient has empty emesis bag at bedside.  Husband at bedside.  Patient requesting more pain medication.  IM Bentyl given.  Will p.o. challenge to decide disposition.  Discussed with husband outside of the room who notes he is having difficulties taking care of patient and feels patient needs to be in living facility.   7:25 PM patient unable to tolerate po. Borderline prolonged Qtc. Ativan given. Will discuss with hospitalist for admission.   8:05 PM Discussed with Dr. Rogers Blocker with [facility] who agrees to admit patient.      [name], [place] 07/18/22 2007  Tracy Pence, MD 07/19/22 [phone]

## 2022-07-18 NOTE — Assessment & Plan Note (Addendum)
-   QTc 488 on admission EKG -Electrolytes replaced as needed

## 2022-07-18 NOTE — Assessment & Plan Note (Addendum)
Continue home percocet and lidocaine patch

## 2022-07-18 NOTE — Assessment & Plan Note (Addendum)
Chronic abdominal pain CT abdomen with no acute changes/findings -Home regimen resumed

## 2022-07-18 NOTE — Assessment & Plan Note (Signed)
Continue abilify  

## 2022-07-18 NOTE — ED Notes (Signed)
Unable to obtain IV access.  Requested another RN to do US guided IV

## 2022-07-18 NOTE — ED Notes (Signed)
Patient will not keep arm straight to receive fluids.  Its the only IV we can obtain.  She appears slow to respond but answers questions appropriately unsure if that is baseline.  PA notified.  Holding off on PO challenge as we are getting a CT

## 2022-07-18 NOTE — Assessment & Plan Note (Addendum)
Well controlled. A1C of 5.3 in 04/2022  Hold metformin, consider taking her off with A1C so tightly controlled  SSI and accuchecks qac/hs

## 2022-07-18 NOTE — ED Notes (Signed)
Spoke with husband.  Patient had cardiac arrest after MI in 05/2020.  Reports since she has not been the same with her affect.  Reports anoxic brain injury due to being down 11 minutes.  Family reports she has been having multiple falls, recent rib fx and compression fx of L4.  Reports he is unable to take care of her because she needs full time care.  PA notified of conversation.

## 2022-07-18 NOTE — ED Provider Notes (Signed)
[place] EMERGENCY DEPARTMENT AT [facility] Provider Note   CSN: [id number] Arrival date & time: 07/18/22  1023     History  Chief Complaint  Patient presents with   Abdominal Pain    [name] is a 52 y.o. female.  The history is provided by the patient and medical records. No language interpreter was used.  Abdominal Pain    52 year old female significant history of diabetes, hyperlipidemia, chronic pain, anxiety, cyclical vomiting presenting with complaint of abdominal pain.  Since yesterday patient has had persistent nausea, vomiting of bilious content, having abdominal discomfort.  She is unable to keep anything down.  She is not urinating and feels dehydrated.  No fever chills body ache chest pain shortness of breath or dysuria.  States she has recurrent abdominal discomfort at least twice weekly for quite a long time.  She denies alcohol or tobacco use and specifically no marijuana use.  Does have history of diabetes.  Home Medications Prior to Admission medications   Medication Sig Start Date End Date Taking? Authorizing Provider  acetaminophen (TYLENOL) 500 MG tablet Take 1,000 mg by mouth as needed for mild pain or headache.    [provider]  ARIPiprazole (ABILIFY) 5 MG tablet Take 1 tablet (5 mg total) by mouth daily. 08/01/20   Jamse Arn, MD  atorvastatin (LIPITOR) 10 MG tablet Take 10 mg by mouth every evening. 08/26/20   [provider]  FLUoxetine (PROZAC) 40 MG capsule Take 1 capsule (40 mg total) by mouth daily. 07/01/20   Angiulli, Lavon Paganini, PA-C  ibuprofen (ADVIL) 200 MG tablet Take 400 mg by mouth every 6 (six) hours as needed for moderate pain.    [provider]  levothyroxine (SYNTHROID) 50 MCG tablet Take 50 mcg by mouth daily before breakfast.    [provider]  lidocaine (LIDODERM) 5 % Place 1 patch onto the skin daily. Remove & Discard patch within 12 hours or as directed by MD 07/01/22    Shalhoub, Sherryll Burger, MD  metFORMIN (GLUCOPHAGE) 500 MG tablet Take 500 mg by mouth 2 (two) times daily with a meal.    [provider]  metoCLOPramide (REGLAN) 5 MG tablet Take 5 mg by mouth 2 (two) times daily. 07/10/22   [provider]  metoprolol succinate (TOPROL-XL) 25 MG 24 hr tablet Take 1 tablet (25 mg total) by mouth daily. Follow up with your PCP regarding your heart rate and whether this should be continued. 07/14/22 08/13/22  Elodia Florence., MD  multivitamin (RENA-VIT) TABS tablet Take 1 tablet by mouth at bedtime. 08/01/20   Jamse Arn, MD  ondansetron (ZOFRAN-ODT) 8 MG disintegrating tablet Take 1 tablet (8 mg total) by mouth every 8 (eight) hours as needed for nausea or vomiting. Patient taking differently: Take 8 mg by mouth as needed for nausea or vomiting. 05/03/22   Thornton Park, MD  oxyCODONE-acetaminophen (PERCOCET) 7.5-325 MG tablet Take 1 tablet by mouth 4 (four) times daily.    [provider]  pantoprazole (PROTONIX) 40 MG tablet Take 1 tablet (40 mg total) by mouth 2 (two) times daily. 05/03/22 05/03/23  Thornton Park, MD  prazosin (MINIPRESS) 2 MG capsule Take 1 capsule (2 mg total) by mouth at bedtime. 08/01/20   Jamse Arn, MD  promethazine (PHENERGAN) 25 MG suppository Place 1 suppository (25 mg total) rectally every 6 (six) hours as needed for nausea or vomiting. Patient taking differently: Place 25 mg rectally 2 (two) times  daily as needed for nausea or vomiting. 12/27/21   Drenda Freeze, MD  promethazine (PHENERGAN) 25 MG tablet Take 25 mg by mouth as needed for nausea or vomiting.    [provider]  tiZANidine (ZANAFLEX) 4 MG tablet Take 3 tablets (12 mg total) by mouth every 8 (eight) hours as needed for up to 7 days for muscle spasms. 07/14/22 07/21/22  Elodia Florence., MD  traZODone (DESYREL) 150 MG tablet Take 150 mg by mouth at bedtime.    [provider]      Allergies    Patient has  no known allergies.    Review of Systems   Review of Systems  Gastrointestinal:  Positive for abdominal pain.  All other systems reviewed and are negative.   Physical Exam Updated Vital Signs BP (!) 162/103 (BP Location: Left Arm)   Pulse (!) 102   Temp 98.4 F (36.9 C) (Oral)   Resp 18   Ht 5\' 4"  (1.626 m)   Wt 83.9 kg   LMP 06/03/2018 (Approximate) Comment: neg hcg 05/10/20  SpO2 100%   BMI 31.76 kg/m  Physical Exam Vitals and nursing note reviewed.  Constitutional:      General: She is not in acute distress.    Appearance: She is well-developed. She is obese.     Comments: Laying bed appears uncomfortable actively vomiting.  HENT:     Head: Atraumatic.  Eyes:     Conjunctiva/sclera: Conjunctivae normal.  Cardiovascular:     Rate and Rhythm: Tachycardia present.     Heart sounds: Normal heart sounds.  Pulmonary:     Effort: Pulmonary effort is normal.     Breath sounds: No wheezing, rhonchi or rales.  Abdominal:     General: Bowel sounds are normal.     Palpations: Abdomen is soft.     Tenderness: There is no abdominal tenderness.     Hernia: No hernia is present.  Musculoskeletal:     Cervical back: Neck supple.  Skin:    Findings: No rash.  Neurological:     Mental Status: She is alert.  Psychiatric:        Mood and Affect: Mood normal.     ED Results / Procedures / Treatments   Labs (all labs ordered are listed, but only abnormal results are displayed) Labs Reviewed  COMPREHENSIVE METABOLIC PANEL - Abnormal; Notable for the following components:      Result Value   Glucose, Bld 135 (*)    BUN <5 (*)    Albumin 3.1 (*)    AST 45 (*)    Alkaline Phosphatase 135 (*)    Anion gap 16 (*)    All other components within normal limits  CBC - Abnormal; Notable for the following components:   RDW 17.2 (*)    Platelets 468 (*)    All other components within normal limits  LIPASE, BLOOD  URINALYSIS, ROUTINE W REFLEX MICROSCOPIC  RAPID URINE DRUG SCREEN,  HOSP PERFORMED  I-STAT BETA HCG BLOOD, ED ([facility], [facility], [facility] ONLY)    EKG None  Radiology US Abdomen Limited  Result Date: 07/18/2022 CLINICAL DATA:  Nausea and vomiting EXAM: ULTRASOUND ABDOMEN LIMITED RIGHT UPPER QUADRANT COMPARISON:  06/25/2022, 05/22/2022 FINDINGS: Gallbladder: No gallstones or wall thickening visualized. No sonographic Murphy sign noted by sonographer. Common bile duct: Diameter: 3 mm Liver: Diffuse increased liver echotexture consistent with hepatic steatosis, stable. No focal liver abnormalities. Portal vein is patent on color Doppler imaging with normal direction  of blood flow towards the liver. Other: None. IMPRESSION: 1. Stable increased liver echotexture consistent with steatosis. 2. Otherwise unremarkable exam. Electronically Signed   By: Randa Ngo M.D.   On: 07/18/2022 13:43    Procedures Procedures    Medications Ordered in ED Medications  metoCLOPramide (REGLAN) injection 10 mg (has no administration in time range)  sodium chloride 0.9 % bolus 1,000 mL (1,000 mLs Intravenous New Bag/Given 07/18/22 1417)  morphine (PF) 4 MG/ML injection 4 mg (4 mg Intravenous Given 07/18/22 1418)  ondansetron (ZOFRAN) injection 4 mg (4 mg Intravenous Given 07/18/22 1418)    ED Course/ Medical Decision Making/ A&P                             Medical Decision Making Amount and/or Complexity of Data Reviewed Labs: ordered. Radiology: ordered.  Risk Prescription drug management.   BP (!) 158/119 (BP Location: Right Arm)   Pulse (!) 112   Temp 99.2 F (37.3 C) (Oral)   Resp 16   Ht 5\' 4"  (1.626 m)   Wt 83.9 kg   LMP 06/03/2018 (Approximate) Comment: neg hcg 05/10/20  SpO2 100%   BMI 31.76 kg/m   67:34 PM 52 year old female significant history of diabetes, hyperlipidemia, chronic pain, anxiety, cyclical vomiting presenting with complaint of abdominal pain.  Since yesterday patient has had persistent nausea, vomiting of bilious content, having abdominal discomfort.   She is unable to keep anything down.  She is not urinating and feels dehydrated.  No fever chills body ache chest pain shortness of breath or dysuria.  States she has recurrent abdominal discomfort at least twice weekly for quite a long time.  She denies alcohol or tobacco use and specifically no marijuana use.  Does have history of diabetes.  On exam patient is laying in bed holding an emesis bag and actively vomiting.  She appears uncomfortable.  She is tachycardic but lungs clear to auscultation abdomen is soft nontender.  Bowel sounds present.  Vital signs notable for elevated blood pressure of 158/119, tachycardic with heart rate of 112, or temperature of 99.2 and normal oxygen 100% on room air.  EMR reviewed patient has had extensive ER visits for similar symptoms related to cyclical vomiting and has been admitted several times in the past for this.  Workup will include basic labs, limited abdominal ultrasound to rule out biliary disease, and supportive care including IV fluid, antiemetic, and opiate pain medication.  -Labs ordered, independently viewed and interpreted by me.  Labs remarkable for reassuring lab value, no significant electrolytes disturbances -The patient was maintained on a cardiac monitor.  I personally viewed and interpreted the cardiac monitored which showed an underlying rhythm of: sinus tachycardia -Imaging independently viewed and interpreted by me and I agree with radiologist's interpretation.  Result remarkable for limited abdominal US without acute changes -This patient presents to the ED for concern of nausea/vomiting, this involves an extensive number of treatment options, and is a complaint that carries with it a high risk of complications and morbidity.  The differential diagnosis includes cyclic vomit, gastroparesis, viral GI, cholecystitis, gastritis, appendicitis, colitis, pyelo -Co morbidities that complicate the patient evaluation includes DM, cyclic  vomit -Treatment includes antiemetic, opiate, IVF -Reevaluation of the patient after these medicines showed that the patient stayed the same -PCP office notes or outside notes reviewed  Patient signed out to oncoming provider, will continue with supportive care for nausea but if patient unable to  tolerate p.o. despite multiple antiemetic medication she may benefit from hospital admission for cyclical vomiting.         Final Clinical Impression(s) / ED Diagnoses Final diagnoses:  Cyclical vomiting    Rx / DC Orders ED Discharge Orders     None         Domenic Moras, PA-C 07/18/22 [place], [place], DO 07/18/22 1735

## 2022-07-18 NOTE — Assessment & Plan Note (Addendum)
-   urinalysis negative for signs of infection - hold off on abx

## 2022-07-18 NOTE — Progress Notes (Signed)
Patient is transferred from ED to unit at 21 20. Alert and oriented x 4. Pain is complained at 4 on 0-10 scale and it is around her umbilical. Vital signs was taken and room is set up. High fall risk protocol. Call light was within patient's reach.

## 2022-07-18 NOTE — ED Notes (Signed)
Pt stated that she could not urinate.

## 2022-07-18 NOTE — Assessment & Plan Note (Signed)
Continue prazosin daily

## 2022-07-18 NOTE — Assessment & Plan Note (Addendum)
Started on metop for inappropriate sinus tachy, but she's been bradycardic on recent admit a few days ago.  Metop reduced to 25 mg on her d/c a few days ago  Request ziopatch per last hospital d/c note  TSH/free T4 and magnesium pending

## 2022-07-19 DIAGNOSIS — R1115 Cyclical vomiting syndrome unrelated to migraine: Secondary | ICD-10-CM | POA: Diagnosis present

## 2022-07-19 DIAGNOSIS — E785 Hyperlipidemia, unspecified: Secondary | ICD-10-CM | POA: Diagnosis present

## 2022-07-19 DIAGNOSIS — I4711 Inappropriate sinus tachycardia, so stated: Secondary | ICD-10-CM | POA: Diagnosis present

## 2022-07-19 DIAGNOSIS — Z85828 Personal history of other malignant neoplasm of skin: Secondary | ICD-10-CM | POA: Diagnosis not present

## 2022-07-19 DIAGNOSIS — Z79890 Hormone replacement therapy (postmenopausal): Secondary | ICD-10-CM | POA: Diagnosis not present

## 2022-07-19 DIAGNOSIS — Z8674 Personal history of sudden cardiac arrest: Secondary | ICD-10-CM | POA: Diagnosis not present

## 2022-07-19 DIAGNOSIS — R160 Hepatomegaly, not elsewhere classified: Secondary | ICD-10-CM | POA: Diagnosis present

## 2022-07-19 DIAGNOSIS — E039 Hypothyroidism, unspecified: Secondary | ICD-10-CM | POA: Diagnosis present

## 2022-07-19 DIAGNOSIS — I10 Essential (primary) hypertension: Secondary | ICD-10-CM | POA: Diagnosis present

## 2022-07-19 DIAGNOSIS — E119 Type 2 diabetes mellitus without complications: Secondary | ICD-10-CM | POA: Diagnosis present

## 2022-07-19 DIAGNOSIS — M159 Polyosteoarthritis, unspecified: Secondary | ICD-10-CM | POA: Diagnosis present

## 2022-07-19 DIAGNOSIS — Z803 Family history of malignant neoplasm of breast: Secondary | ICD-10-CM | POA: Diagnosis not present

## 2022-07-19 DIAGNOSIS — M5030 Other cervical disc degeneration, unspecified cervical region: Secondary | ICD-10-CM | POA: Diagnosis present

## 2022-07-19 DIAGNOSIS — Z833 Family history of diabetes mellitus: Secondary | ICD-10-CM | POA: Diagnosis not present

## 2022-07-19 DIAGNOSIS — G894 Chronic pain syndrome: Secondary | ICD-10-CM | POA: Diagnosis present

## 2022-07-19 DIAGNOSIS — K760 Fatty (change of) liver, not elsewhere classified: Secondary | ICD-10-CM | POA: Diagnosis present

## 2022-07-19 DIAGNOSIS — Z7984 Long term (current) use of oral hypoglycemic drugs: Secondary | ICD-10-CM | POA: Diagnosis not present

## 2022-07-19 DIAGNOSIS — G931 Anoxic brain damage, not elsewhere classified: Secondary | ICD-10-CM | POA: Diagnosis present

## 2022-07-19 DIAGNOSIS — K2270 Barrett's esophagus without dysplasia: Secondary | ICD-10-CM | POA: Diagnosis present

## 2022-07-19 DIAGNOSIS — R1084 Generalized abdominal pain: Secondary | ICD-10-CM

## 2022-07-19 DIAGNOSIS — M4856XA Collapsed vertebra, not elsewhere classified, lumbar region, initial encounter for fracture: Secondary | ICD-10-CM | POA: Diagnosis present

## 2022-07-19 DIAGNOSIS — I2510 Atherosclerotic heart disease of native coronary artery without angina pectoris: Secondary | ICD-10-CM | POA: Diagnosis present

## 2022-07-19 DIAGNOSIS — R825 Elevated urine levels of drugs, medicaments and biological substances: Secondary | ICD-10-CM | POA: Insufficient documentation

## 2022-07-19 DIAGNOSIS — R1011 Right upper quadrant pain: Secondary | ICD-10-CM | POA: Diagnosis present

## 2022-07-19 DIAGNOSIS — F4310 Post-traumatic stress disorder, unspecified: Secondary | ICD-10-CM | POA: Diagnosis present

## 2022-07-19 DIAGNOSIS — Z79899 Other long term (current) drug therapy: Secondary | ICD-10-CM | POA: Diagnosis not present

## 2022-07-19 DIAGNOSIS — F319 Bipolar disorder, unspecified: Secondary | ICD-10-CM | POA: Diagnosis present

## 2022-07-19 LAB — RAPID URINE DRUG SCREEN, HOSP PERFORMED
Amphetamines: NOT DETECTED
Barbiturates: NOT DETECTED
Benzodiazepines: NOT DETECTED
Cocaine: NOT DETECTED
Opiates: POSITIVE — AB
Tetrahydrocannabinol: NOT DETECTED

## 2022-07-19 LAB — CBC
HCT: 38.7 % (ref 36.0–46.0)
Hemoglobin: 12 g/dL (ref 12.0–15.0)
MCH: 30.5 pg (ref 26.0–34.0)
MCHC: 31 g/dL (ref 30.0–36.0)
MCV: 98.2 fL (ref 80.0–100.0)
Platelets: 384 10*3/uL (ref 150–400)
RBC: 3.94 MIL/uL (ref 3.87–5.11)
RDW: 17.5 % — ABNORMAL HIGH (ref 11.5–15.5)
WBC: 9.8 10*3/uL (ref 4.0–10.5)
nRBC: 0 % (ref 0.0–0.2)

## 2022-07-19 LAB — COMPREHENSIVE METABOLIC PANEL
ALT: 26 U/L (ref 0–44)
AST: 28 U/L (ref 15–41)
Albumin: 2.9 g/dL — ABNORMAL LOW (ref 3.5–5.0)
Alkaline Phosphatase: 108 U/L (ref 38–126)
Anion gap: 11 (ref 5–15)
BUN: <5 mg/dL — ABNORMAL LOW (ref 6–20)
CO2: 26 mmol/L (ref 22–32)
Calcium: 8.7 mg/dL — ABNORMAL LOW (ref 8.9–10.3)
Chloride: 105 mmol/L (ref 98–111)
Creatinine, Ser: 0.73 mg/dL (ref 0.44–1.00)
GFR, Estimated: >60 mL/min (ref >60)
Glucose, Bld: 106 mg/dL — ABNORMAL HIGH (ref 70–99)
Potassium: 3.5 mmol/L (ref 3.5–5.1)
Sodium: 142 mmol/L (ref 135–145)
Total Bilirubin: 0.9 mg/dL (ref 0.3–1.2)
Total Protein: 6.7 g/dL (ref 6.5–8.1)

## 2022-07-19 LAB — URINALYSIS, ROUTINE W REFLEX MICROSCOPIC
Bilirubin Urine: NEGATIVE
Glucose, UA: NEGATIVE mg/dL
Hgb urine dipstick: NEGATIVE
Ketones, ur: NEGATIVE mg/dL
Leukocytes,Ua: NEGATIVE
Nitrite: NEGATIVE
Protein, ur: NEGATIVE mg/dL
Specific Gravity, Urine: 1.03 (ref 1.005–1.030)
pH: 7 (ref 5.0–8.0)

## 2022-07-19 LAB — TSH: TSH: 0.784 u[IU]/mL (ref 0.350–4.500)

## 2022-07-19 LAB — GLUCOSE, CAPILLARY
Glucose-Capillary: 133 mg/dL — ABNORMAL HIGH (ref 70–99)
Glucose-Capillary: 135 mg/dL — ABNORMAL HIGH (ref 70–99)
Glucose-Capillary: 138 mg/dL — ABNORMAL HIGH (ref 70–99)
Glucose-Capillary: 118 mg/dL — ABNORMAL HIGH (ref 70–99)

## 2022-07-19 LAB — T4, FREE: Free T4: 1.31 ng/dL — ABNORMAL HIGH (ref 0.61–1.12)

## 2022-07-19 NOTE — Progress Notes (Signed)
Progress Note    [name]   [id number]  DOB: 30-Jun-1970  DOA: 07/18/2022     0 PCP: Ferd Hibbs, NP  Initial CC: N/V  Hospital Course: Ms. Genier is a 52 year old female with PMH MI with cardiac arrest in 05/2020 which led to anoxic brain injury,  hypothyroidism, T2DM, depression, HTN, HLD, PTSD, bipolar d/o,  DDD/chronic low back pain, barrett's esophagus who presented to ED with complaints of abdominal pain and vomiting.  Patient recently hospitalized on 07/11/2022 for similar complaints along with recurrent admissions for similar in the past.  She does not have any precipitating factors for her nausea/vomiting.  Some associated abdominal pain and right lower quadrant.  No diarrhea nor sick contacts. CT abdomen/pelvis was obtained which showed mild hepatomegaly and extensive hepatic steatosis; no other acute findings.  RUQ ultrasound also obtained which was negative for acute findings and showed stable liver echotexture consistent with steatosis as well. She was started on fluids and liquid diet which was slowly advanced.  Interval History:  Patient admitted overnight with chronic abdominal pain and acute worsening of nausea/vomiting.  Feeling better some this morning and tolerating full liquids wishing to attempt trial of solids today.  Assessment and Plan: * Cyclic vomiting syndrome - No obvious precipitating factors.  Patient referred for outpatient gastroparesis study but already does small multiple meals throughout day -Home antiemetic regimen does provide relief typically - Continue fluids and antiemetics - Patient tolerating full liquids and amenable for trial of solids today  borderline prolonged QT interval - QTc 488 on admission EKG - Continue monitoring electrolytes  Dysuria - urinalysis negative for signs of infection - hold off on abx  Abdominal pain, generalized Chronic abdominal pain CT abdomen with no acute changes/findings Continue home oxycodone  and PRN morphine for break through pain   Compression fracture of fourth lumbar vertebra (HCC) Continue home percocet and lidocaine patch PRN IV morphine if she can't tolerate PO   Chronic pain syndrome On oxycodone 7.5mg  QID  Continue this, but IV morphine for break through or if can't tolerate PO  Last filled 3/19 for #120   Type 2 diabetes mellitus without complication, without long-term current use of insulin ([place]) Well controlled. A1C of 5.3 in 04/2022  -On metformin outpatient  Barrett's esophagus Continue protonix 40mg  IV BID   Inappropriate sinus tachycardia - Continue metoprolol  Essential hypertension Continue metoprolol 25mg  daily   Depression with anxiety Continue prozac 40mg  daily and trazodone at bedtime   Bipolar affective disorder (HCC) Continue abilify  Hypothyroidism - TSH normal - slightly elevated FT4, 1.31 - continue synthroid  PTSD (post-traumatic stress disorder) Continue prazosin daily   Positive urine drug screen - prescribed chronic opiates - note, false positive amphetamine on UDS due to abilify and/or trazodone use    Old records reviewed in assessment of this patient  Antimicrobials:   DVT prophylaxis:  enoxaparin (LOVENOX) injection 40 mg Start: 07/18/22 2200   Code Status:   Code Status: Full Code  Mobility Assessment (last 72 hours)     Mobility Assessment     Row Name 07/18/22 2130           Does patient have an order for bedrest or is patient medically unstable No - Continue assessment  Simultaneous filing. User may not have seen previous data.       What is the highest level of mobility based on the progressive mobility assessment? Level 5 (Walks with assist in room/hall) - Balance while stepping forward/back  and can walk in room with assist - Complete  Simultaneous filing. User may not have seen previous data.       Is the above level different from baseline mobility prior to current illness? Yes - Recommend PT order                 Barriers to discharge: None Disposition Plan: Home tentatively Friday Status is: Observation  Objective: Blood pressure 110/75, pulse 67, temperature 97.9 F (36.6 C), temperature source Oral, resp. rate 18, height 5\' 4"  (1.626 m), weight 83.9 kg, last menstrual period 06/03/2018, SpO2 94 %.  Examination:  Physical Exam Constitutional:      Appearance: Normal appearance. She is obese.  HENT:     Head: Normocephalic and atraumatic.     Mouth/Throat:     Mouth: Mucous membranes are moist.  Eyes:     Extraocular Movements: Extraocular movements intact.  Cardiovascular:     Rate and Rhythm: Normal rate and regular rhythm.  Pulmonary:     Effort: Pulmonary effort is normal. No respiratory distress.     Breath sounds: Normal breath sounds. No wheezing.  Abdominal:     General: Bowel sounds are normal. There is no distension.     Palpations: Abdomen is soft.     Tenderness: There is no abdominal tenderness.  Musculoskeletal:        General: Normal range of motion.     Cervical back: Normal range of motion and neck supple.  Skin:    General: Skin is warm and dry.  Neurological:     General: No focal deficit present.     Mental Status: She is alert.  Psychiatric:        Mood and Affect: Mood normal.      Consultants:    Procedures:    Data Reviewed: Results for orders placed or performed during the hospital encounter of 07/18/22 (from the past 24 hour(s))  Glucose, capillary     Status: None   Collection Time: 07/18/22  9:41 PM  Result Value Ref Range   Glucose-Capillary 94 70 - 99 mg/dL  TSH     Status: None   Collection Time: 07/19/22 12:31 AM  Result Value Ref Range   TSH 0.784 0.350 - 4.500 uIU/mL  T4, free     Status: Abnormal   Collection Time: 07/19/22 12:31 AM  Result Value Ref Range   Free T4 1.31 (H) 0.61 - 1.12 ng/dL  Comprehensive metabolic panel     Status: Abnormal   Collection Time: 07/19/22 12:31 AM  Result Value Ref Range    Sodium 142 135 - 145 mmol/L   Potassium 3.5 3.5 - 5.1 mmol/L   Chloride 105 98 - 111 mmol/L   CO2 26 22 - 32 mmol/L   Glucose, Bld 106 (H) 70 - 99 mg/dL   BUN <5 (L) 6 - 20 mg/dL   Creatinine, Ser 0.73 0.44 - 1.00 mg/dL   Calcium 8.7 (L) 8.9 - 10.3 mg/dL   Total Protein 6.7 6.5 - 8.1 g/dL   Albumin 2.9 (L) 3.5 - 5.0 g/dL   AST 28 15 - 41 U/L   ALT 26 0 - 44 U/L   Alkaline Phosphatase 108 38 - 126 U/L   Total Bilirubin 0.9 0.3 - 1.2 mg/dL   GFR, Estimated >60 >60 mL/min   Anion gap 11 5 - 15  CBC     Status: Abnormal   Collection Time: 07/19/22 12:31 AM  Result Value Ref Range  WBC 9.8 4.0 - 10.5 K/uL   RBC 3.94 3.87 - 5.11 MIL/uL   Hemoglobin 12.0 12.0 - 15.0 g/dL   HCT 38.7 36.0 - 46.0 %   MCV 98.2 80.0 - 100.0 fL   MCH 30.5 26.0 - 34.0 pg   MCHC 31.0 30.0 - 36.0 g/dL   RDW 17.5 (H) 11.5 - 15.5 %   Platelets 384 150 - 400 K/uL   nRBC 0.0 0.0 - 0.2 %  Glucose, capillary     Status: Abnormal   Collection Time: 07/19/22  7:29 AM  Result Value Ref Range   Glucose-Capillary 133 (H) 70 - 99 mg/dL  Urinalysis, Routine w reflex microscopic -Urine, Clean Catch     Status: None   Collection Time: 07/19/22 10:30 AM  Result Value Ref Range   Color, Urine YELLOW YELLOW   APPearance CLEAR CLEAR   Specific Gravity, Urine 1.030 1.005 - 1.030   pH 7.0 5.0 - 8.0   Glucose, UA NEGATIVE NEGATIVE mg/dL   Hgb urine dipstick NEGATIVE NEGATIVE   Bilirubin Urine NEGATIVE NEGATIVE   Ketones, ur NEGATIVE NEGATIVE mg/dL   Protein, ur NEGATIVE NEGATIVE mg/dL   Nitrite NEGATIVE NEGATIVE   Leukocytes,Ua NEGATIVE NEGATIVE  Rapid urine drug screen (hospital performed)     Status: Abnormal   Collection Time: 07/19/22 10:30 AM  Result Value Ref Range   Opiates POSITIVE (A) NONE DETECTED   Cocaine NONE DETECTED NONE DETECTED   Benzodiazepines NONE DETECTED NONE DETECTED   Amphetamines NONE DETECTED NONE DETECTED   Tetrahydrocannabinol NONE DETECTED NONE DETECTED   Barbiturates NONE DETECTED NONE  DETECTED  Glucose, capillary     Status: Abnormal   Collection Time: 07/19/22 11:22 AM  Result Value Ref Range   Glucose-Capillary 135 (H) 70 - 99 mg/dL    I have reviewed pertinent nursing notes, vitals, labs, and images as necessary. I have ordered labwork to follow up on as indicated.  I have reviewed the last notes from staff over past 24 hours. I have discussed patient's care plan and test results with nursing staff, CM/SW, and other staff as appropriate.  Time spent: Greater than 50% of the 55 minute visit was spent in counseling/coordination of care for the patient as laid out in the A&P.   LOS: 0 days   Dwyane Dee, MD [facility] 07/19/2022, 11:56 AM

## 2022-07-19 NOTE — Progress Notes (Signed)
  Transition of Care [facility]) Screening Note   Patient Details  Name: [name] Date of Birth: January 03, 1971   Transition of Care [facility]) CM/SW Contact:    Vassie Moselle, LCSW Phone Number: 07/19/2022, 12:50 PM    Transition of Care Department [facility]) has reviewed patient and no TOC needs have been identified at this time. We will continue to monitor patient advancement through interdisciplinary progression rounds. If new patient transition needs arise, please place a TOC consult.

## 2022-07-19 NOTE — Assessment & Plan Note (Signed)
-   prescribed chronic opiates - note, false positive amphetamine on UDS due to abilify and/or trazodone use

## 2022-07-19 NOTE — Hospital Course (Addendum)
[name] is a 52 year old female with PMH MI with cardiac arrest in 05/2020 which led to anoxic brain injury,  hypothyroidism, T2DM, depression, HTN, HLD, PTSD, bipolar d/o,  DDD/chronic low back pain, barrett's esophagus who presented to ED with complaints of abdominal pain and vomiting.  Patient recently hospitalized on 07/11/2022 for similar complaints along with recurrent admissions for similar in the past.  She does not have any precipitating factors for her nausea/vomiting.  Some associated abdominal pain and right lower quadrant.  No diarrhea nor sick contacts. CT abdomen/pelvis was obtained which showed mild hepatomegaly and extensive hepatic steatosis; no other acute findings.  RUQ ultrasound also obtained which was negative for acute findings and showed stable liver echotexture consistent with steatosis as well. She was started on fluids and liquid diet which was slowly advanced.  She was able to tolerate solids well prior to discharge and was considered stable for discharging home.

## 2022-07-19 NOTE — Progress Notes (Signed)
IV team to bedside for IV consult. Pt with working IV in [place], pt does not want to be stuck since current access is sufficient.

## 2022-07-19 NOTE — Plan of Care (Signed)

## 2022-07-20 DIAGNOSIS — R1115 Cyclical vomiting syndrome unrelated to migraine: Secondary | ICD-10-CM | POA: Diagnosis not present

## 2022-07-20 DIAGNOSIS — R1084 Generalized abdominal pain: Secondary | ICD-10-CM | POA: Diagnosis not present

## 2022-07-20 LAB — GLUCOSE, CAPILLARY
Glucose-Capillary: 107 mg/dL — ABNORMAL HIGH (ref 70–99)
Glucose-Capillary: 138 mg/dL — ABNORMAL HIGH (ref 70–99)

## 2022-07-20 MED ORDER — PANTOPRAZOLE SODIUM 40 MG PO TBEC
40.0000 mg | DELAYED_RELEASE_TABLET | Freq: Two times a day (BID) | ORAL | Status: DC
  Administered 2022-07-20: 40 mg via ORAL
  Filled 2022-07-20: qty 1

## 2022-07-20 MED ORDER — TIZANIDINE HCL 4 MG PO TABS: 12.0000 mg | ORAL_TABLET | Freq: Three times a day (TID) | ORAL | 0 refills | Status: DC | PRN

## 2022-07-20 NOTE — Plan of Care (Signed)

## 2022-07-20 NOTE — Discharge Summary (Signed)
Physician Discharge Summary   [name] [id number] DOB: [birth year] DOA: 07/18/2022  PCP: Ferd Hibbs, NP  Admit date: 07/18/2022 Discharge date: 07/20/2022  Admitted From: Home Disposition:  Home Discharging physician: Dwyane Dee, MD Barriers to discharge: none  Recommendations for Outpatient Follow-up:  Repeat thyroid studies in ~4 weeks  Discharge Condition: stable CODE STATUS: Full Diet recommendation:  Diet Orders (From admission, onward)     Start     Ordered   07/20/22 0000  Diet Carb Modified        07/20/22 1057   07/19/22 0923  Diet regular Fluid consistency: Thin  Diet effective now       Question:  Fluid consistency:  Answer:  Thin   07/19/22 [id number]            Hospital Course: [name] is a 52 year old female with PMH MI with cardiac arrest in 05/2020 which led to anoxic brain injury,  hypothyroidism, T2DM, depression, HTN, HLD, PTSD, bipolar d/o,  DDD/chronic low back pain, barrett's esophagus who presented to ED with complaints of abdominal pain and vomiting.  Patient recently hospitalized on 07/11/2022 for similar complaints along with recurrent admissions for similar in the past.  She does not have any precipitating factors for her nausea/vomiting.  Some associated abdominal pain and right lower quadrant.  No diarrhea nor sick contacts. CT abdomen/pelvis was obtained which showed mild hepatomegaly and extensive hepatic steatosis; no other acute findings.  RUQ ultrasound also obtained which was negative for acute findings and showed stable liver echotexture consistent with steatosis as well. She was started on fluids and liquid diet which was slowly advanced.  She was able to tolerate solids well prior to discharge and was considered stable for discharging home.  Assessment and Plan: * Cyclic vomiting syndrome - No obvious precipitating factors.  Patient referred for outpatient gastroparesis study but already does small multiple meals throughout  day -Home antiemetic regimen does provide relief typically -Treated with fluids and antiemetics.  Diet advanced successfully to solids prior to discharge and was tolerated well  borderline prolonged QT interval - QTc 488 on admission EKG -Electrolytes replaced as needed  Dysuria - urinalysis negative for signs of infection - hold off on abx  Abdominal pain, generalized Chronic abdominal pain CT abdomen with no acute changes/findings -Home regimen resumed  Compression fracture of fourth lumbar vertebra (HCC) Continue home percocet and lidocaine patch  Chronic pain syndrome - Home regimen resumed  Type 2 diabetes mellitus without complication, without long-term current use of insulin ([place]) Well controlled. A1C of 5.3 in 04/2022  -On metformin outpatient  Barrett's esophagus Continue protonix  Inappropriate sinus tachycardia - Continue metoprolol  Essential hypertension Continue metoprolol 25mg  daily   Depression with anxiety Continue prozac 40mg  daily and trazodone at bedtime   Bipolar affective disorder (HCC) Continue abilify  Hypothyroidism - TSH normal - slightly elevated FT4, 1.31 - continue synthroid  PTSD (post-traumatic stress disorder) Continue prazosin daily   Positive urine drug screen - prescribed chronic opiates - note, false positive amphetamine on UDS due to abilify and/or trazodone use   The patient's chronic medical conditions were treated accordingly per the patient's home medication regimen except as noted.  On day of discharge, patient was felt deemed stable for discharge. Patient/family member advised to call PCP or come back to ER if needed.   Principal Diagnosis: Cyclic vomiting syndrome  Discharge Diagnoses: Active Hospital Problems   Diagnosis Date Noted   Cyclic vomiting syndrome 07/18/2022    Priority:  1.   borderline prolonged QT interval 07/18/2022    Priority: 2.   Dysuria 07/18/2022    Priority: 3.   Abdominal pain,  generalized 09/01/2014    Priority: 4.   Compression fracture of fourth lumbar vertebra ([place]) 06/25/2022    Priority: 5.   Chronic pain syndrome 09/18/2021    Priority: 6.   Type 2 diabetes mellitus without complication, without long-term current use of insulin ([place]) 08/02/2021    Priority: 7.   Barrett's esophagus     Priority: 8.   Inappropriate sinus tachycardia 09/01/2014    Priority: 9.   Essential hypertension 12/26/2018    Priority: 10.   Depression with anxiety 12/17/2014    Priority: 11.   Bipolar affective disorder ([place]) 11/28/2018    Priority: 12.   Hypothyroidism 09/18/2021    Priority: 13.   PTSD (post-traumatic stress disorder) 09/01/2014    Priority: 14.   Positive urine drug screen 07/19/2022    Resolved Hospital Problems  No resolved problems to display.     Discharge Instructions     Diet Carb Modified   Complete by: As directed    Increase activity slowly   Complete by: As directed       Allergies as of 07/20/2022   No Known Allergies      Medication List     STOP taking these medications    metFORMIN 500 MG tablet Commonly known as: GLUCOPHAGE       TAKE these medications    acetaminophen 650 MG CR tablet Commonly known as: TYLENOL Take 1,300 mg by mouth as needed for pain.   ARIPiprazole 5 MG tablet Commonly known as: ABILIFY Take 1 tablet (5 mg total) by mouth daily.   atorvastatin 10 MG tablet Commonly known as: LIPITOR Take 10 mg by mouth every evening.   FLUoxetine 40 MG capsule Commonly known as: PROZAC Take 1 capsule (40 mg total) by mouth daily.   ibuprofen 200 MG tablet Commonly known as: ADVIL Take 400 mg by mouth 3 (three) times daily as needed for moderate pain.   levothyroxine 50 MCG tablet Commonly known as: SYNTHROID Take 50 mcg by mouth daily before breakfast.   LIDOCAINE EX Apply 1 Application topically as needed (pain). Cream What changed: Another medication with the same name was changed. Make sure  you understand how and when to take each.   lidocaine 5 % Commonly known as: LIDODERM Place 1 patch onto the skin daily. Remove & Discard patch within 12 hours or as directed by MD What changed:  when to take this reasons to take this additional instructions   metoCLOPramide 5 MG tablet Commonly known as: REGLAN Take 5 mg by mouth 2 (two) times daily.   metoprolol succinate 25 MG 24 hr tablet Commonly known as: TOPROL-XL Take 1 tablet (25 mg total) by mouth daily. Follow up with your PCP regarding your heart rate and whether this should be continued. What changed:  when to take this additional instructions   multivitamin Tabs tablet Take 1 tablet by mouth at bedtime.   ondansetron 8 MG disintegrating tablet Commonly known as: ZOFRAN-ODT Take 1 tablet (8 mg total) by mouth every 8 (eight) hours as needed for nausea or vomiting. What changed: when to take this   oxyCODONE-acetaminophen 7.5-325 MG tablet Commonly known as: PERCOCET Take 1 tablet by mouth 4 (four) times daily.   pantoprazole 40 MG tablet Commonly known as: Protonix Take 1 tablet (40 mg total) by mouth 2 (two) times  daily.   prazosin 2 MG capsule Commonly known as: MINIPRESS Take 1 capsule (2 mg total) by mouth at bedtime.   promethazine 25 MG tablet Commonly known as: PHENERGAN Take 25 mg by mouth as needed for nausea or vomiting. What changed: Another medication with the same name was changed. Make sure you understand how and when to take each.   promethazine 25 MG suppository Commonly known as: PHENERGAN Place 1 suppository (25 mg total) rectally every 6 (six) hours as needed for nausea or vomiting. What changed: when to take this   tiZANidine 4 MG tablet Commonly known as: ZANAFLEX Take 3 tablets (12 mg total) by mouth every 8 (eight) hours as needed. What changed: reasons to take this   traZODone 150 MG tablet Commonly known as: DESYREL Take 150 mg by mouth at bedtime.        No Known  Allergies  Consultations:   Procedures:   Discharge Exam: BP 113/80   Pulse (!) 56   Temp 98.1 F (36.7 C) (Oral)   Resp 16   Ht 5\' 4"  (1.626 m)   Wt 83.9 kg   LMP 06/03/2018 (Approximate) Comment: neg hcg 05/10/20  SpO2 95%   BMI 31.76 kg/m  Physical Exam Constitutional:      Appearance: Normal appearance. She is well-developed. She is obese.  HENT:     Head: Normocephalic and atraumatic.     Mouth/Throat:     Mouth: Mucous membranes are moist.  Eyes:     Extraocular Movements: Extraocular movements intact.  Cardiovascular:     Rate and Rhythm: Normal rate and regular rhythm.  Pulmonary:     Effort: Pulmonary effort is normal. No respiratory distress.     Breath sounds: Normal breath sounds. No wheezing.  Abdominal:     General: Bowel sounds are normal. There is no distension.     Palpations: Abdomen is soft.     Tenderness: There is no abdominal tenderness.  Musculoskeletal:        General: Normal range of motion.     Cervical back: Normal range of motion and neck supple.  Skin:    General: Skin is warm and dry.  Neurological:     General: No focal deficit present.     Mental Status: She is alert.  Psychiatric:        Mood and Affect: Mood normal.      The results of significant diagnostics from this hospitalization (including imaging, microbiology, ancillary and laboratory) are listed below for reference.   Microbiology: Recent Results (from the past 240 hour(s))  Culture, blood (routine x 2)     Status: None   Collection Time: 07/11/22  9:40 PM   Specimen: BLOOD  Result Value Ref Range Status   Specimen Description   Final    BLOOD LEFT ANTECUBITAL Performed at [facility] [address], [place], [place] [address]    Special Requests   Final    BOTTLES DRAWN AEROBIC AND ANAEROBIC Blood Culture adequate volume Performed at [facility] [address], [place], [place] [address]    Culture   Final    NO  GROWTH 5 DAYS Performed at [facility], [place] [address], [place], [place] [address]    Report Status 07/17/2022 FINAL  Final  Culture, blood (routine x 2)     Status: None   Collection Time: 07/12/22  9:51 AM   Specimen: BLOOD  Result Value Ref Range Status   Specimen  Description   Final    BLOOD RIGHT ANTECUBITAL Performed at [facility] [address] [place], [place] [address]    Special Requests   Final    BOTTLES DRAWN AEROBIC ONLY Blood Culture results may not be optimal due to an inadequate volume of blood received in culture bottles Performed at [facility] [address] [place], [place] [address]    Culture   Final    NO GROWTH 5 DAYS Performed at [facility], [place] [address], [place], [place] [address]    Report Status 07/17/2022 FINAL  Final  Urine Culture (for pregnant, neutropenic or urologic patients or patients with an indwelling urinary catheter)     Status: Abnormal (Preliminary result)   Collection Time: 07/19/22 10:30 AM   Specimen: Urine, Clean Catch  Result Value Ref Range Status   Specimen Description   Final    URINE, CLEAN CATCH Performed at [facility], [place] [address], [place], [place] [address]    Special Requests   Final    NONE Performed at [facility], [place] [address] [place], [place] [address]    Culture (A)  Final    10,000 COLONIES/mL ENTEROCOCCUS FAECALIS CULTURE REINCUBATED FOR BETTER GROWTH Performed at [facility], [place] [address], [place], [place] [address]    Report Status PENDING  Incomplete     Labs: BNP (last 3 results) Recent Labs    06/30/22 0336  BNP [id number]*   Basic Metabolic Panel: Recent Labs  Lab 07/14/22 0612 07/18/22 1102 07/18/22 1122 07/19/22 0031  NA 140 143  --  142  K 4.0 3.6  --  3.5  CL 108 103  --  105  CO2 26 24  --  26  GLUCOSE 109* 135*  --  106*  BUN 6 <5*  --  <5*  CREATININE 0.59 0.80  --  0.73   CALCIUM 7.9* 9.2  --  8.7*  MG 1.8  --  1.9  --   PHOS 3.4  --   --   --    Liver Function Tests: Recent Labs  Lab 07/14/22 0612 07/18/22 1102 07/19/22 0031  AST 33 45* 28  ALT 21 35 26  ALKPHOS 97 135* 108  BILITOT 0.7 0.7 0.9  PROT 5.3* 7.6 6.7  ALBUMIN 2.1* 3.1* 2.9*   Recent Labs  Lab 07/18/22 1102  LIPASE 18   No results for input(s): "AMMONIA" in the last 168 hours. CBC: Recent Labs  Lab 07/14/22 0612 07/18/22 1102 07/19/22 0031  WBC 7.0 10.4 9.8  NEUTROABS 4.3  --   --   HGB 10.7* 13.9 12.0  HCT 34.3* 43.8 38.7  MCV 98.8 97.3 98.2  PLT 285 468* 384   Cardiac Enzymes: No results for input(s): "CKTOTAL", "CKMB", "CKMBINDEX", "TROPONINI" in the last 168 hours. BNP: Invalid input(s): "POCBNP" CBG: Recent Labs  Lab 07/19/22 1122 07/19/22 1628 07/19/22 2102 07/20/22 0717 07/20/22 1121  GLUCAP 135* 138* 118* 107* 138*   D-Dimer No results for input(s): "DDIMER" in the last 72 hours. Hgb A1c No results for input(s): "HGBA1C" in the last 72 hours. Lipid Profile No results for input(s): "CHOL", "HDL", "LDLCALC", "TRIG", "CHOLHDL", "LDLDIRECT" in the last 72 hours. Thyroid function studies Recent Labs    07/19/22 0031  TSH 0.784   Anemia work up No results for input(s): "VITAMINB12", "FOLATE", "FERRITIN", "TIBC", "IRON", "RETICCTPCT" in the last 72 hours. Urinalysis    Component Value Date/Time  COLORURINE YELLOW 07/19/2022 1030   APPEARANCEUR CLEAR 07/19/2022 1030   LABSPEC 1.030 07/19/2022 1030   PHURINE 7.0 07/19/2022 1030   GLUCOSEU NEGATIVE 07/19/2022 1030   HGBUR NEGATIVE 07/19/2022 1030   BILIRUBINUR NEGATIVE 07/19/2022 1030   KETONESUR NEGATIVE 07/19/2022 1030   PROTEINUR NEGATIVE 07/19/2022 1030   UROBILINOGEN 0.2 01/19/2015 0302   NITRITE NEGATIVE 07/19/2022 1030   LEUKOCYTESUR NEGATIVE 07/19/2022 1030   Sepsis Labs Recent Labs  Lab 07/14/22 0612 07/18/22 1102 07/19/22 0031  WBC 7.0 10.4 9.8   Microbiology Recent  Results (from the past 240 hour(s))  Culture, blood (routine x 2)     Status: None   Collection Time: 07/11/22  9:40 PM   Specimen: BLOOD  Result Value Ref Range Status   Specimen Description   Final    BLOOD LEFT ANTECUBITAL Performed at [facility], [place] [address] [place], [place] [address]    Special Requests   Final    BOTTLES DRAWN AEROBIC AND ANAEROBIC Blood Culture adequate volume Performed at [facility] [address], [place], [place] [address]    Culture   Final    NO GROWTH 5 DAYS Performed at [facility], [place] [address], [place], [place] [address]    Report Status 07/17/2022 FINAL  Final  Culture, blood (routine x 2)     Status: None   Collection Time: 07/12/22  9:51 AM   Specimen: BLOOD  Result Value Ref Range Status   Specimen Description   Final    BLOOD RIGHT ANTECUBITAL Performed at [facility] [address], [place], [place] [address]    Special Requests   Final    BOTTLES DRAWN AEROBIC ONLY Blood Culture results may not be optimal due to an inadequate volume of blood received in culture bottles Performed at [facility] [address] [place], [place] [address]    Culture   Final    NO GROWTH 5 DAYS Performed at [facility], [place] [address] [place], [place] [address]    Report Status 07/17/2022 FINAL  Final  Urine Culture (for pregnant, neutropenic or urologic patients or patients with an indwelling urinary catheter)     Status: Abnormal (Preliminary result)   Collection Time: 07/19/22 10:30 AM   Specimen: Urine, Clean Catch  Result Value Ref Range Status   Specimen Description   Final    URINE, CLEAN CATCH Performed at [facility], [place] [address] [place], [place] [address]    Special Requests   Final    NONE Performed at [facility], [place] [address] [place], [place] [address]    Culture (A)  Final    10,000  COLONIES/mL ENTEROCOCCUS FAECALIS CULTURE REINCUBATED FOR BETTER GROWTH Performed at [facility], [place] [address] [place], [place] [address]    Report Status PENDING  Incomplete    Procedures/Studies: CT ABDOMEN PELVIS W CONTRAST  Result Date: 07/18/2022 CLINICAL DATA:  Right lower quadrant abdominal pain. Nausea vomiting. EXAM: CT ABDOMEN AND PELVIS WITH CONTRAST TECHNIQUE: Multidetector CT imaging of the abdomen and pelvis was performed using the standard protocol following bolus administration of intravenous contrast. RADIATION DOSE REDUCTION: This exam was performed according to the departmental dose-optimization program which includes automated exposure control, adjustment of the mA and/or kV according to patient size and/or use of iterative reconstruction technique. CONTRAST:  181mL OMNIPAQUE IOHEXOL 300 MG/ML  SOLN COMPARISON:  06/25/2022. FINDINGS: Lower chest: Small right  effusion. Mild dependent right lung base opacity consistent with atelectasis. Hepatobiliary: Mild enlargement of the liver, 22 cm transversely. Diffuse decreased liver attenuation consistent with fatty infiltration. No liver mass or focal lesion. Normal gallbladder. No bile duct dilation. Pancreas: Mild pancreatic atrophy.  No mass or inflammation. Spleen: Normal in size without focal abnormality. Adrenals/Urinary Tract: Adrenal glands are unremarkable. Kidneys are normal, without renal calculi, focal lesion, or hydronephrosis. Bladder is unremarkable. Stomach/Bowel: Normal stomach. Small bowel is normal in caliber. No wall thickening. No inflammation. Most of the colon is decompressed. No convincing wall thickening or inflammation. Normal appendix visualized. Vascular/Lymphatic: Mild aortic atherosclerosis. No aneurysm. No enlarged lymph nodes. Reproductive: Uterus and bilateral adnexa are unremarkable. Other: No abdominal wall hernia or abnormality. No abdominopelvic ascites. Musculoskeletal: Mild chronic depression of  the upper endplate of L4 stable from the prior CT. No acute fracture. No bone lesion. IMPRESSION: 1. Mild hepatomegaly and extensive hepatic steatosis. No liver mass. Appearance is similar to the prior CT. 2. No acute findings within the abdomen or pelvis. Normal appendix visualized. No bowel obstruction or inflammation. 3. Mild aortic atherosclerosis. Electronically Signed   By: Lajean Manes M.D.   On: 07/18/2022 18:25   US Abdomen Limited  Result Date: 07/18/2022 CLINICAL DATA:  Nausea and vomiting EXAM: ULTRASOUND ABDOMEN LIMITED RIGHT UPPER QUADRANT COMPARISON:  06/25/2022, 05/22/2022 FINDINGS: Gallbladder: No gallstones or wall thickening visualized. No sonographic Murphy sign noted by sonographer. Common bile duct: Diameter: 3 mm Liver: Diffuse increased liver echotexture consistent with hepatic steatosis, stable. No focal liver abnormalities. Portal vein is patent on color Doppler imaging with normal direction of blood flow towards the liver. Other: None. IMPRESSION: 1. Stable increased liver echotexture consistent with steatosis. 2. Otherwise unremarkable exam. Electronically Signed   By: Randa Ngo M.D.   On: 07/18/2022 13:43   ECHOCARDIOGRAM COMPLETE  Result Date: 06/30/2022    ECHOCARDIOGRAM REPORT   Patient Name:   ROLAYNE SCHAIBLE Date of Exam: 06/30/2022 Medical Rec #:  [id number]       Height:       64.0 in Accession #:    [id number]      Weight:       185.0 lb Date of Birth:  1971-03-17      BSA:          1.893 m Patient Age:    52 years        BP:           109/71 mmHg Patient Gender: F               HR:           85 bpm. Exam Location:  Inpatient Procedure: 2D Echo, Cardiac Doppler and Color Doppler Indications:    congestive heart failure  History:        Patient has prior history of Echocardiogram examinations, most                 recent 06/09/2020. Risk Factors:Hypertension, Diabetes and                 Dyslipidemia.  Sonographer:    Johny Chess RDCS Referring Phys: [id number] Sherryll Burger [facility]  Sonographer Comments: Image acquisition challenging due to patient body habitus. IMPRESSIONS  1. Left ventricular ejection fraction, by estimation, is 55 to 60%. The left ventricle has normal function. The left ventricle has no regional wall motion abnormalities. Left ventricular diastolic parameters are consistent with Grade I diastolic dysfunction (impaired relaxation). Elevated  left atrial pressure.  2. Right ventricular systolic function is normal. The right ventricular size is normal. There is severely elevated pulmonary artery systolic pressure.  3. The mitral valve is abnormal. Moderate mitral valve regurgitation. No evidence of mitral stenosis.  4. The tricuspid valve is abnormal. Tricuspid valve regurgitation is moderate.  5. The aortic valve has an indeterminant number of cusps. Aortic valve regurgitation is mild to moderate. No aortic stenosis is present.  6. The inferior vena cava is normal in size with greater than 50% respiratory variability, suggesting right atrial pressure of 3 mmHg. FINDINGS  Left Ventricle: Left ventricular ejection fraction, by estimation, is 55 to 60%. The left ventricle has normal function. The left ventricle has no regional wall motion abnormalities. The left ventricular internal cavity size was normal in size. There is  no left ventricular hypertrophy. Left ventricular diastolic parameters are consistent with Grade I diastolic dysfunction (impaired relaxation). Elevated left atrial pressure. Right Ventricle: The right ventricular size is normal. Right vetricular wall thickness was not well visualized. Right ventricular systolic function is normal. There is severely elevated pulmonary artery systolic pressure. The tricuspid regurgitant velocity is 3.92 m/s, and with an assumed right atrial pressure of 3 mmHg, the estimated right ventricular systolic pressure is 123456 mmHg. Left Atrium: Left atrial size was normal in size. Right Atrium: Right atrial size was normal in  size. Pericardium: There is no evidence of pericardial effusion. Mitral Valve: The mitral valve is abnormal. Moderate mitral valve regurgitation. No evidence of mitral valve stenosis. Tricuspid Valve: The tricuspid valve is abnormal. Tricuspid valve regurgitation is moderate . No evidence of tricuspid stenosis. Aortic Valve: The aortic valve has an indeterminant number of cusps. Aortic valve regurgitation is mild to moderate. Aortic regurgitation PHT measures 351 msec. No aortic stenosis is present. Pulmonic Valve: The pulmonic valve was not well visualized. Pulmonic valve regurgitation is not visualized. No evidence of pulmonic stenosis. Aorta: The aortic root is normal in size and structure. Venous: The inferior vena cava is normal in size with greater than 50% respiratory variability, suggesting right atrial pressure of 3 mmHg. IAS/Shunts: No atrial level shunt detected by color flow Doppler.  LEFT VENTRICLE PLAX 2D LVIDd:         4.60 cm   Diastology LVIDs:         3.30 cm   LV e' medial:    5.77 cm/s LV PW:         1.00 cm   LV E/e' medial:  20.1 LV IVS:        0.90 cm   LV e' lateral:   6.20 cm/s LVOT diam:     1.80 cm   LV E/e' lateral: 18.7 LV SV:         38 LV SV Index:   20 LVOT Area:     2.54 cm  RIGHT VENTRICLE RV Basal diam:  2.50 cm RV S prime:     11.60 cm/s TAPSE (M-mode): 1.7 cm LEFT ATRIUM             Index        RIGHT ATRIUM           Index LA diam:        3.00 cm 1.59 cm/m   RA Area:     10.70 cm LA Vol (A2C):   43.2 ml 22.83 ml/m  RA Volume:   24.40 ml  12.89 ml/m LA Vol (A4C):   41.2 ml 21.77 ml/m LA Biplane Vol:  46.0 ml 24.31 ml/m  AORTIC VALVE LVOT Vmax:   93.00 cm/s LVOT Vmean:  59.000 cm/s LVOT VTI:    0.149 m AI PHT:      351 msec  AORTA Ao Root diam: 2.80 cm Ao Asc diam:  2.90 cm MITRAL VALVE                  TRICUSPID VALVE MV Area (PHT): 3.77 cm       TR Peak grad:   61.5 mmHg MV Decel Time: 201 msec       TR Vmax:        392.00 cm/s MR Peak grad:    102.4 mmHg MR Mean grad:     70.0 mmHg    SHUNTS MR Vmax:         506.00 cm/s  Systemic VTI:  0.15 m MR Vmean:        404.0 cm/s   Systemic Diam: 1.80 cm MR PISA:         1.01 cm MR PISA Eff ROA: 8 mm MR PISA Radius:  0.40 cm MV E velocity: 116.00 cm/s MV A velocity: 124.00 cm/s MV E/A ratio:  0.94 Carlyle Dolly MD Electronically signed by Carlyle Dolly MD Signature Date/Time: 06/30/2022/1:57:40 PM    Final    EEG adult  Result Date: 06/28/2022 Lora Havens, MD     06/28/2022  4:52 PM Patient Name: DONELLA ANZIVINO MRN: [id number] Epilepsy Attending: Lora Havens Referring Physician/Provider: Vernelle Emerald, MD Date: 06/28/2022 Duration: 23.06 mins Patient history: 52yo F with ams. EEG to evaluate for seizure. Level of alertness: Awake AEDs during EEG study: None Technical aspects: This EEG study was done with scalp electrodes positioned according to the 10-20 International system of electrode placement. Electrical activity was reviewed with band pass filter of 1-70Hz , sensitivity of 7 uV/mm, display speed of 50mm/sec with a 60Hz  notched filter applied as appropriate. EEG data were recorded continuously and digitally stored.  Video monitoring was available and reviewed as appropriate. Description: The posterior dominant rhythm consists of 8 Hz activity of moderate voltage (25-35 uV) seen predominantly in posterior head regions, symmetric and reactive to eye opening and eye closing. Hyperventilation and photic stimulation were not performed.   IMPRESSION: This study is within normal limits. No seizures or epileptiform discharges were seen throughout the recording. Priyanka O Yadav   [place] EKG SITE RITE  Result Date: 06/27/2022 If Site Rite image not attached, placement could not be confirmed due to current cardiac rhythm.  DG Abd 1 View  Result Date: 06/27/2022 CLINICAL DATA:  Severe nausea. Clinical concern for bowel obstruction. EXAM: ABDOMEN - 1 VIEW COMPARISON:  Chest, abdomen and pelvis CT dated 06/25/2022 FINDINGS: Normal  bowel gas pattern. Unremarkable bones. The patient refused a 2nd image to include the inferior pelvis. IMPRESSION: No acute abnormality.  No evidence of bowel obstruction. Electronically Signed   By: Claudie Revering M.D.   On: 06/27/2022 09:34   MR LUMBAR SPINE WO CONTRAST  Result Date: 06/26/2022 CLINICAL DATA:  Compression fracture, lumbar EXAM: MRI LUMBAR SPINE WITHOUT CONTRAST TECHNIQUE: Multiplanar, multisequence MR imaging of the lumbar spine was performed. No intravenous contrast was administered. COMPARISON:  CT 06/25/2022, MRI 09/25/2020 FINDINGS: Segmentation:  Standard. Alignment:  Physiologic. Vertebrae: Acute superior endplate compression fracture of the L4 vertebral body approximately 20% vertebral body height loss. Bone marrow edema at the fracture site. No significant bony retropulsion. No evidence of fracture extension into the posterior elements. Chronic mild superior  endplate compression fracture of the L3 vertebral body with associated Schmorl's node. No residual bone marrow edema. Remaining vertebral body heights are maintained. No additional fractures. No evidence of discitis. No marrow replacing bone lesion. Conus medullaris and cauda equina: Conus extends to the L2-L3 level. Conus and cauda equina appear normal. Paraspinal and other soft tissues: Negative. Disc levels: T12-L1 and L1-L2: Unremarkable. L2-L3: Disc desiccation without focal disc protrusion. No foraminal or canal stenosis. L3-L4: No significant disc protrusion, foraminal stenosis, or canal stenosis. L4-L5: No significant disc protrusion, foraminal stenosis, or canal stenosis. L5-S1: Disc desiccation and height loss with mild bulging disc, eccentric to the left. Mild left foraminal stenosis. No canal stenosis. IMPRESSION: 1. Acute superior endplate compression fracture of the L4 vertebral body with approximately 20% vertebral body height loss. No bony retropulsion. 2. Chronic mild superior endplate compression fracture of the L3  vertebral body. 3. Mild degenerative disc disease at L5-S1 with mild left foraminal stenosis. No canal stenosis at any level. Electronically Signed   By: Davina Poke D.O.   On: 06/26/2022 14:37   CT Head Wo Contrast  Result Date: 06/25/2022 CLINICAL DATA:  Fall EXAM: CT HEAD WITHOUT CONTRAST CT CERVICAL SPINE WITHOUT CONTRAST CT THORACIC SPINE WITHOUT CONTRAST CT LUMBAR  SPINE WITHOUT CONTRAST TECHNIQUE: Multidetector CT imaging of the head and cervical, thoracic, and lumbar spine was performed following the standard protocol without intravenous contrast. Multiplanar CT image reconstructions of the cervical spine were also generated. RADIATION DOSE REDUCTION: This exam was performed according to the departmental dose-optimization program which includes automated exposure control, adjustment of the mA and/or kV according to patient size and/or use of iterative reconstruction technique. COMPARISON:  None Available. FINDINGS: CT HEAD FINDINGS Brain: No evidence of acute infarction, hemorrhage, hydrocephalus, extra-axial collection or mass lesion/mass effect. Vascular: No hyperdense vessel or unexpected calcification. Skull: Normal. Negative for fracture or focal lesion. Sinuses/Orbits: No middle ear or mastoid effusion. Paranasal sinuses are notable for trace mucosal thickening of the floor of the right maxillary sinus. Orbits are unremarkable. Other: None. CT CERVICAL, THORACIC, LUMBAR SPINE FINDINGS Alignment: Straightening of the normal cervical lordosis. Skull base and vertebrae: Cervical spine - no acute fracture. No primary bone lesion or focal pathologic process. Thoracic spine - mild superior endplate height loss at T11 is favored to be secondary to a Schmorl's node. Lumbar spine - compared to 03/21/22, there is a new mild superior endplate compression deformity at L4. Unchanged Schmorl's node at L3. Soft tissues and spinal canal: No prevertebral fluid or swelling. No visible canal hematoma. Disc  levels:  No evidence of high-grade spinal canal stenosis. Visualized chest: Negative. Please see same day CT chest abdomen and pelvis for additional findings, including a small left posterior diaphragmatic hernia, which is likely congenital. Other: There are few prominent cervical lymph nodes, which are nonspecific, and likely reactive. IMPRESSION: 1. No CT evidence of intracranial injury. 2. No acute fracture or traumatic listhesis in the cervical or thoracic spine 3. New mild superior endplate compression deformity at L4, favored to be acute. Electronically Signed   By: Marin Roberts M.D.   On: 06/25/2022 12:23   CT Cervical Spine Wo Contrast  Result Date: 06/25/2022 CLINICAL DATA:  Fall EXAM: CT HEAD WITHOUT CONTRAST CT CERVICAL SPINE WITHOUT CONTRAST CT THORACIC SPINE WITHOUT CONTRAST CT LUMBAR  SPINE WITHOUT CONTRAST TECHNIQUE: Multidetector CT imaging of the head and cervical, thoracic, and lumbar spine was performed following the standard protocol without intravenous contrast. Multiplanar CT image reconstructions of the  cervical spine were also generated. RADIATION DOSE REDUCTION: This exam was performed according to the departmental dose-optimization program which includes automated exposure control, adjustment of the mA and/or kV according to patient size and/or use of iterative reconstruction technique. COMPARISON:  None Available. FINDINGS: CT HEAD FINDINGS Brain: No evidence of acute infarction, hemorrhage, hydrocephalus, extra-axial collection or mass lesion/mass effect. Vascular: No hyperdense vessel or unexpected calcification. Skull: Normal. Negative for fracture or focal lesion. Sinuses/Orbits: No middle ear or mastoid effusion. Paranasal sinuses are notable for trace mucosal thickening of the floor of the right maxillary sinus. Orbits are unremarkable. Other: None. CT CERVICAL, THORACIC, LUMBAR SPINE FINDINGS Alignment: Straightening of the normal cervical lordosis. Skull base and vertebrae:  Cervical spine - no acute fracture. No primary bone lesion or focal pathologic process. Thoracic spine - mild superior endplate height loss at T11 is favored to be secondary to a Schmorl's node. Lumbar spine - compared to 03/21/22, there is a new mild superior endplate compression deformity at L4. Unchanged Schmorl's node at L3. Soft tissues and spinal canal: No prevertebral fluid or swelling. No visible canal hematoma. Disc levels:  No evidence of high-grade spinal canal stenosis. Visualized chest: Negative. Please see same day CT chest abdomen and pelvis for additional findings, including a small left posterior diaphragmatic hernia, which is likely congenital. Other: There are few prominent cervical lymph nodes, which are nonspecific, and likely reactive. IMPRESSION: 1. No CT evidence of intracranial injury. 2. No acute fracture or traumatic listhesis in the cervical or thoracic spine 3. New mild superior endplate compression deformity at L4, favored to be acute. Electronically Signed   By: Marin Roberts M.D.   On: 06/25/2022 12:23   CT T-SPINE NO CHARGE  Result Date: 06/25/2022 CLINICAL DATA:  Fall EXAM: CT HEAD WITHOUT CONTRAST CT CERVICAL SPINE WITHOUT CONTRAST CT THORACIC SPINE WITHOUT CONTRAST CT LUMBAR  SPINE WITHOUT CONTRAST TECHNIQUE: Multidetector CT imaging of the head and cervical, thoracic, and lumbar spine was performed following the standard protocol without intravenous contrast. Multiplanar CT image reconstructions of the cervical spine were also generated. RADIATION DOSE REDUCTION: This exam was performed according to the departmental dose-optimization program which includes automated exposure control, adjustment of the mA and/or kV according to patient size and/or use of iterative reconstruction technique. COMPARISON:  None Available. FINDINGS: CT HEAD FINDINGS Brain: No evidence of acute infarction, hemorrhage, hydrocephalus, extra-axial collection or mass lesion/mass effect. Vascular: No  hyperdense vessel or unexpected calcification. Skull: Normal. Negative for fracture or focal lesion. Sinuses/Orbits: No middle ear or mastoid effusion. Paranasal sinuses are notable for trace mucosal thickening of the floor of the right maxillary sinus. Orbits are unremarkable. Other: None. CT CERVICAL, THORACIC, LUMBAR SPINE FINDINGS Alignment: Straightening of the normal cervical lordosis. Skull base and vertebrae: Cervical spine - no acute fracture. No primary bone lesion or focal pathologic process. Thoracic spine - mild superior endplate height loss at T11 is favored to be secondary to a Schmorl's node. Lumbar spine - compared to 03/21/22, there is a new mild superior endplate compression deformity at L4. Unchanged Schmorl's node at L3. Soft tissues and spinal canal: No prevertebral fluid or swelling. No visible canal hematoma. Disc levels:  No evidence of high-grade spinal canal stenosis. Visualized chest: Negative. Please see same day CT chest abdomen and pelvis for additional findings, including a small left posterior diaphragmatic hernia, which is likely congenital. Other: There are few prominent cervical lymph nodes, which are nonspecific, and likely reactive. IMPRESSION: 1. No CT evidence of intracranial  injury. 2. No acute fracture or traumatic listhesis in the cervical or thoracic spine 3. New mild superior endplate compression deformity at L4, favored to be acute. Electronically Signed   By: Marin Roberts M.D.   On: 06/25/2022 12:23   CT L-SPINE NO CHARGE  Result Date: 06/25/2022 CLINICAL DATA:  Fall EXAM: CT HEAD WITHOUT CONTRAST CT CERVICAL SPINE WITHOUT CONTRAST CT THORACIC SPINE WITHOUT CONTRAST CT LUMBAR  SPINE WITHOUT CONTRAST TECHNIQUE: Multidetector CT imaging of the head and cervical, thoracic, and lumbar spine was performed following the standard protocol without intravenous contrast. Multiplanar CT image reconstructions of the cervical spine were also generated. RADIATION DOSE REDUCTION:  This exam was performed according to the departmental dose-optimization program which includes automated exposure control, adjustment of the mA and/or kV according to patient size and/or use of iterative reconstruction technique. COMPARISON:  None Available. FINDINGS: CT HEAD FINDINGS Brain: No evidence of acute infarction, hemorrhage, hydrocephalus, extra-axial collection or mass lesion/mass effect. Vascular: No hyperdense vessel or unexpected calcification. Skull: Normal. Negative for fracture or focal lesion. Sinuses/Orbits: No middle ear or mastoid effusion. Paranasal sinuses are notable for trace mucosal thickening of the floor of the right maxillary sinus. Orbits are unremarkable. Other: None. CT CERVICAL, THORACIC, LUMBAR SPINE FINDINGS Alignment: Straightening of the normal cervical lordosis. Skull base and vertebrae: Cervical spine - no acute fracture. No primary bone lesion or focal pathologic process. Thoracic spine - mild superior endplate height loss at T11 is favored to be secondary to a Schmorl's node. Lumbar spine - compared to 03/21/22, there is a new mild superior endplate compression deformity at L4. Unchanged Schmorl's node at L3. Soft tissues and spinal canal: No prevertebral fluid or swelling. No visible canal hematoma. Disc levels:  No evidence of high-grade spinal canal stenosis. Visualized chest: Negative. Please see same day CT chest abdomen and pelvis for additional findings, including a small left posterior diaphragmatic hernia, which is likely congenital. Other: There are few prominent cervical lymph nodes, which are nonspecific, and likely reactive. IMPRESSION: 1. No CT evidence of intracranial injury. 2. No acute fracture or traumatic listhesis in the cervical or thoracic spine 3. New mild superior endplate compression deformity at L4, favored to be acute. Electronically Signed   By: Marin Roberts M.D.   On: 06/25/2022 12:23   CT CHEST ABDOMEN PELVIS W CONTRAST  Result Date:  06/25/2022 CLINICAL DATA:  52 year old female status post two falls recently. Pain, nausea vomiting. EXAM: CT CHEST, ABDOMEN, AND PELVIS WITH CONTRAST TECHNIQUE: Multidetector CT imaging of the chest, abdomen and pelvis was performed following the standard protocol during bolus administration of intravenous contrast. RADIATION DOSE REDUCTION: This exam was performed according to the departmental dose-optimization program which includes automated exposure control, adjustment of the mA and/or kV according to patient size and/or use of iterative reconstruction technique. CONTRAST:  154mL OMNIPAQUE IOHEXOL 300 MG/ML  SOLN COMPARISON:  CTA chest 03/03/2022. CT thoracic and lumbar spine today reported separately. CT Abdomen and Pelvis 05/21/2022. FINDINGS: CT CHEST FINDINGS Cardiovascular: Negative. No pericardial effusion. No significant atherosclerosis identified. Mediastinum/Nodes: Negative. No mediastinal hematoma or lymphadenopathy. Lungs/Pleura: Major airways are patent. Similar lung volumes to last year. Chronic elevation of the right hemidiaphragm. Mild linear right lung base scarring/atelectasis. No pneumothorax, pleural effusion, pulmonary contusion, or other abnormal lung opacity. Musculoskeletal: Thoracic spine is detailed separately. Visible shoulder osseous structures appear intact. No acute sternal fracture. Chronic rib fractures (such as right anterior 3rd rib). No acute rib fracture identified. CT ABDOMEN PELVIS FINDINGS Hepatobiliary: Chronic  fatty liver. Liver and gallbladder appear intact. No perihepatic fluid. No bile duct enlargement. Pancreas: Partial atrophy. Spleen: Negative. Adrenals/Urinary Tract: Negative. Stomach/Bowel: Negative large bowel, much of the large bowel appears stable and negative, including retrocecal appendix (series 2, image 91). Conspicuous caliber change in the ascending colon along the 3 cm segment on coronal image 62 is new compared to January and probably peristalsis  artifact. Negative terminal ileum. No dilated small bowel. Stomach and duodenum appear negative. No free air or free fluid identified. Vascular/Lymphatic: Calcified aortic atherosclerosis. Major arterial structures appear patent and intact. Portal venous system is patent. No lymphadenopathy identified. Reproductive: Negative. Other: No pelvis free fluid. Musculoskeletal: Lumbar spine detailed separately., and Sacrum, SI joints, pelvis (chronic inferior pubic rami fractures) proximal femurs appear stable and intact. No superficial soft tissue injury identified. IMPRESSION: 1. No acute traumatic injury identified in the chest, abdomen, or pelvis. See Thoracic and Lumbar Spine CT reported separately. 2. Conspicuous caliber change in the ascending colon, but that same segment appeared negative in January, so suspect peristalsis artifact. Recommend up-to-date colon cancer screening for this patient. 3. Fatty liver. Electronically Signed   By: Genevie Ann M.D.   On: 06/25/2022 12:21     Time coordinating discharge: Over 25 minutes    Dwyane Dee, MD  [facility] 07/20/2022, 1:06 PM

## 2022-07-20 NOTE — Progress Notes (Signed)
PHARMACIST - PHYSICIAN COMMUNICATION  DR:   Sabino Gasser  CONCERNING: IV to Oral Route Change Policy  RECOMMENDATION: This patient is receiving protonix by the intravenous route.  Based on criteria approved by the Pharmacy and Therapeutics Committee, the intravenous medication(s) is/are being converted to the equivalent oral dose form(s).   DESCRIPTION: These criteria include: The patient is eating (either orally or via tube) and/or has been taking other orally administered medications for a least 24 hours The patient has no evidence of active gastrointestinal bleeding or impaired GI absorption (gastrectomy, short bowel, patient on TNA or NPO).  If you have questions about this conversion, please contact the Pharmacy Department  []   [phone] )  [facility] []   [phone] )  [facility] []   [phone] )  [facility] []   [phone] )  [facility] [x]   [phone] )  [place], PharmD, BCPS 07/20/2022 9:04 AM

## 2022-07-22 LAB — URINE CULTURE: Culture: 10000 — AB

## 2022-07-25 ENCOUNTER — Encounter (HOSPITAL_COMMUNITY): Payer: Self-pay

## 2022-07-25 ENCOUNTER — Emergency Department (HOSPITAL_COMMUNITY)
Admission: EM | Admit: 2022-07-25 | Discharge: 2022-07-26 | Disposition: A | Discharge status: Home / Self Care | Payer: Medicare Other | Attending: Emergency Medicine | Admitting: Emergency Medicine

## 2022-07-25 ENCOUNTER — Other Ambulatory Visit: Payer: Self-pay

## 2022-07-25 DIAGNOSIS — E876 Hypokalemia: Secondary | ICD-10-CM | POA: Diagnosis not present

## 2022-07-25 DIAGNOSIS — R109 Unspecified abdominal pain: Secondary | ICD-10-CM | POA: Insufficient documentation

## 2022-07-25 DIAGNOSIS — Z79899 Other long term (current) drug therapy: Secondary | ICD-10-CM | POA: Insufficient documentation

## 2022-07-25 DIAGNOSIS — R1110 Vomiting, unspecified: Secondary | ICD-10-CM

## 2022-07-25 DIAGNOSIS — R000 Tachycardia, unspecified: Secondary | ICD-10-CM | POA: Diagnosis not present

## 2022-07-25 DIAGNOSIS — R112 Nausea with vomiting, unspecified: Secondary | ICD-10-CM | POA: Diagnosis not present

## 2022-07-25 LAB — COMPREHENSIVE METABOLIC PANEL
ALT: 30 U/L (ref 0–44)
AST: 39 U/L (ref 15–41)
Albumin: 3.1 g/dL — ABNORMAL LOW (ref 3.5–5.0)
Alkaline Phosphatase: 106 U/L (ref 38–126)
Anion gap: 13 (ref 5–15)
BUN: <5 mg/dL — ABNORMAL LOW (ref 6–20)
CO2: 25 mmol/L (ref 22–32)
Calcium: 8.7 mg/dL — ABNORMAL LOW (ref 8.9–10.3)
Chloride: 103 mmol/L (ref 98–111)
Creatinine, Ser: 0.91 mg/dL (ref 0.44–1.00)
GFR, Estimated: >60 mL/min (ref >60)
Glucose, Bld: 127 mg/dL — ABNORMAL HIGH (ref 70–99)
Potassium: 3.2 mmol/L — ABNORMAL LOW (ref 3.5–5.1)
Sodium: 141 mmol/L (ref 135–145)
Total Bilirubin: 0.9 mg/dL (ref 0.3–1.2)
Total Protein: 7.4 g/dL (ref 6.5–8.1)

## 2022-07-25 LAB — CBC WITH DIFFERENTIAL/PLATELET
Abs Immature Granulocytes: 0.04 10*3/uL (ref 0.00–0.07)
Basophils Absolute: 0.1 10*3/uL (ref 0.0–0.1)
Basophils Relative: 0 %
Eosinophils Absolute: 0 10*3/uL (ref 0.0–0.5)
Eosinophils Relative: 0 %
HCT: 42 % (ref 36.0–46.0)
Hemoglobin: 13.4 g/dL (ref 12.0–15.0)
Immature Granulocytes: 0 %
Lymphocytes Relative: 6 %
Lymphs Abs: 0.7 10*3/uL (ref 0.7–4.0)
MCH: 31.1 pg (ref 26.0–34.0)
MCHC: 31.9 g/dL (ref 30.0–36.0)
MCV: 97.4 fL (ref 80.0–100.0)
Monocytes Absolute: 0.6 10*3/uL (ref 0.1–1.0)
Monocytes Relative: 5 %
Neutro Abs: 10.5 10*3/uL — ABNORMAL HIGH (ref 1.7–7.7)
Neutrophils Relative %: 89 %
Platelets: 359 10*3/uL (ref 150–400)
RBC: 4.31 MIL/uL (ref 3.87–5.11)
RDW: 15.8 % — ABNORMAL HIGH (ref 11.5–15.5)
WBC: 11.9 10*3/uL — ABNORMAL HIGH (ref 4.0–10.5)
nRBC: 0 % (ref 0.0–0.2)

## 2022-07-25 LAB — LIPASE, BLOOD: Lipase: 21 U/L (ref 11–51)

## 2022-07-25 MED ORDER — HYDROMORPHONE HCL 1 MG/ML IJ SOLN
1.0000 mg | Freq: Once | INTRAMUSCULAR | Status: AC
  Administered 2022-07-25: 1 mg via INTRAVENOUS
  Filled 2022-07-25: qty 1

## 2022-07-25 MED ORDER — SODIUM CHLORIDE 0.9 % IV SOLN: 6.2500 mg | Freq: Four times a day (QID) | INTRAVENOUS | Status: DC | PRN

## 2022-07-25 MED ORDER — HYDROMORPHONE HCL 1 MG/ML IJ SOLN
1.0000 mg | Freq: Once | INTRAMUSCULAR | Status: AC
  Administered 2022-07-26: 1 mg via INTRAVENOUS
  Filled 2022-07-25: qty 1

## 2022-07-25 MED ORDER — FAMOTIDINE IN NACL 20-0.9 MG/50ML-% IV SOLN
20.0000 mg | Freq: Once | INTRAVENOUS | Status: AC
  Administered 2022-07-25: 20 mg via INTRAVENOUS
  Filled 2022-07-25: qty 50

## 2022-07-25 MED ORDER — ONDANSETRON HCL 4 MG/2ML IJ SOLN
4.0000 mg | Freq: Once | INTRAMUSCULAR | Status: AC
  Administered 2022-07-26: 4 mg via INTRAVENOUS
  Filled 2022-07-25: qty 2

## 2022-07-25 MED ORDER — SODIUM CHLORIDE 0.9 % IV SOLN
12.5000 mg | Freq: Four times a day (QID) | INTRAVENOUS | Status: DC | PRN
  Administered 2022-07-25: 12.5 mg via INTRAVENOUS
  Filled 2022-07-25: qty 12.5

## 2022-07-25 MED ORDER — SODIUM CHLORIDE 0.9 % IV BOLUS
1000.0000 mL | Freq: Once | INTRAVENOUS | Status: AC
  Administered 2022-07-25: 1000 mL via INTRAVENOUS

## 2022-07-25 NOTE — ED Notes (Signed)
[place] PIV established.

## 2022-07-25 NOTE — ED Provider Notes (Signed)
[place] EMERGENCY DEPARTMENT AT [facility] Provider Note   CSN: [id number] Arrival date & time: 07/25/22  1804     History {Add pertinent medical, surgical, social history, OB history to HPI:1} Chief Complaint  Patient presents with   Nausea   Emesis    [name] is a 52 y.o. female with a history of gastroparesis presenting to the ED with abdominal pain and vomiting.  Patient reports that she never truly got better since she was discharged from the hospital on 07/20/22.  She has had multiple admissions for persistent nausea and vomiting.  She has a tentative diagnosis of gastroparesis.  She has had multiple CT scans and ultrasounds of the gallbladder which do not show any emergent findings or reasons for her vomiting.  She was most recently discharged in the hospital 4 days ago after admission again for nausea and vomiting.  She has a chronic pain syndrome is not on pain medications at home.  Her most recent CT scan was performed on 07/18/22 with mild hepatomegaly and extensive hepatic steatosis, no other acute findings of the abdomen.  Her gallbladder ultrasound of the same date showed steatosis findings but was otherwise unremarkable.  She reports she does not smoke or use marijuana  HPI     Home Medications Prior to Admission medications   Medication Sig Start Date End Date Taking? Authorizing Provider  acetaminophen (TYLENOL) 650 MG CR tablet Take 1,300 mg by mouth as needed for pain.    [provider]  ARIPiprazole (ABILIFY) 5 MG tablet Take 1 tablet (5 mg total) by mouth daily. 08/01/20   Jamse Arn, MD  atorvastatin (LIPITOR) 10 MG tablet Take 10 mg by mouth every evening. 08/26/20   [provider]  FLUoxetine (PROZAC) 40 MG capsule Take 1 capsule (40 mg total) by mouth daily. 07/01/20   Angiulli, Lavon Paganini, PA-C  ibuprofen (ADVIL) 200 MG tablet Take 400 mg by mouth 3 (three) times daily as needed for moderate pain.    [provider]  levothyroxine (SYNTHROID) 50 MCG tablet Take 50 mcg by mouth daily before breakfast.    [provider]  lidocaine (LIDODERM) 5 % Place 1 patch onto the skin daily. Remove & Discard patch within 12 hours or as directed by MD Patient taking differently: Place 1 patch onto the skin as needed (pain). 07/01/22   Shalhoub, Sherryll Burger, MD  LIDOCAINE EX Apply 1 Application topically as needed (pain). Cream    [provider]  metoCLOPramide (REGLAN) 5 MG tablet Take 5 mg by mouth 2 (two) times daily. 07/10/22   [provider]  metoprolol succinate (TOPROL-XL) 25 MG 24 hr tablet Take 1 tablet (25 mg total) by mouth daily. Follow up with your PCP regarding your heart rate and whether this should be continued. Patient taking differently: Take 25 mg by mouth every evening. 07/14/22 08/13/22  Elodia Florence., MD  multivitamin (RENA-VIT) TABS tablet Take 1 tablet by mouth at bedtime. 08/01/20   Jamse Arn, MD  ondansetron (ZOFRAN-ODT) 8 MG disintegrating tablet Take 1 tablet (8 mg total) by mouth every 8 (eight) hours as needed for nausea or vomiting. Patient taking differently: Take 8 mg by mouth as needed for nausea or vomiting. 05/03/22   Thornton Park, MD  oxyCODONE-acetaminophen (PERCOCET) 7.5-325 MG tablet Take 1 tablet by mouth 4 (four) times daily.    [provider]  pantoprazole (PROTONIX) 40 MG tablet Take 1 tablet (40 mg total)  by mouth 2 (two) times daily. 05/03/22 05/03/23  Thornton Park, MD  prazosin (MINIPRESS) 2 MG capsule Take 1 capsule (2 mg total) by mouth at bedtime. 08/01/20   Jamse Arn, MD  promethazine (PHENERGAN) 25 MG suppository Place 1 suppository (25 mg total) rectally every 6 (six) hours as needed for nausea or vomiting. Patient taking differently: Place 25 mg rectally as needed for nausea or vomiting. 12/27/21   Drenda Freeze, MD  promethazine (PHENERGAN) 25 MG tablet Take 25 mg by mouth as needed for nausea  or vomiting.    [provider]  tiZANidine (ZANAFLEX) 4 MG tablet Take 3 tablets (12 mg total) by mouth every 8 (eight) hours as needed. 07/20/22   Dwyane Dee, MD  traZODone (DESYREL) 150 MG tablet Take 150 mg by mouth at bedtime.    [provider]      Allergies    Patient has no known allergies.    Review of Systems   Review of Systems  Physical Exam Updated Vital Signs BP (!) 167/120   Pulse (!) 124   Temp 98.5 F (36.9 C) (Oral)   Resp 18   Ht 5\' 4"  (1.626 m)   Wt 86.2 kg   LMP 06/03/2018 (Approximate) Comment: neg hcg 05/10/20  BMI 32.61 kg/m  Physical Exam Constitutional:      General: She is not in acute distress.    Comments: Dry heaving in the room  HENT:     Head: Normocephalic and atraumatic.  Eyes:     Conjunctiva/sclera: Conjunctivae normal.     Pupils: Pupils are equal, round, and reactive to light.  Cardiovascular:     Rate and Rhythm: Regular rhythm. Tachycardia present.  Pulmonary:     Effort: Pulmonary effort is normal. No respiratory distress.  Abdominal:     General: There is no distension.     Tenderness: There is no abdominal tenderness.  Skin:    General: Skin is warm and dry.  Neurological:     General: No focal deficit present.     Mental Status: She is alert. Mental status is at baseline.  Psychiatric:        Mood and Affect: Mood normal.        Behavior: Behavior normal.     ED Results / Procedures / Treatments   Labs (all labs ordered are listed, but only abnormal results are displayed) Labs Reviewed  COMPREHENSIVE METABOLIC PANEL  CBC WITH DIFFERENTIAL/PLATELET  LIPASE, BLOOD    EKG None  Radiology No results found.  Procedures Procedures  {Document cardiac monitor, telemetry assessment procedure when appropriate:1}  Medications Ordered in ED Medications  sodium chloride 0.9 % bolus 1,000 mL (has no administration in time range)  HYDROmorphone (DILAUDID) injection 1 mg (has no administration in  time range)  promethazine (PHENERGAN) 6.25 mg in sodium chloride 0.9 % 50 mL IVPB (has no administration in time range)  famotidine (PEPCID) IVPB 20 mg premix (has no administration in time range)    ED Course/ Medical Decision Making/ A&P   {   Click here for ABCD2, HEART and other calculatorsREFRESH Note before signing :1}                          Medical Decision Making Amount and/or Complexity of Data Reviewed Labs: ordered. ECG/medicine tests: ordered.  Risk Prescription drug management.   This patient presents to the ED with concern for nausea, vomiting abdominal pain. This involves an  extensive number of treatment options, and is a complaint that carries with it a high risk of complications and morbidity.  The differential diagnosis includes gastritis versus gastroparesis versus cyclical vomiting versus other  Co-morbidities that complicate the patient evaluation: History of vomiting at high risk of exacerbation  External records from outside source obtained and reviewed including hospital discharge summary from March 2024  I ordered and personally interpreted labs.  The pertinent results include:  ***  I ordered imaging studies including *** I independently visualized and interpreted imaging which showed *** I agree with the radiologist interpretation  The patient was maintained on a cardiac monitor.  I personally viewed and interpreted the cardiac monitored which showed an underlying rhythm of: ***  Per my interpretation the patient's ECG shows ***  I ordered medication including for pain and nausea and fluids  I have reviewed the patients home medicines and have made adjustments as needed  Test Considered: I have very low suspicion for acute intra-abdominal surgical or infectious emergency I do not see an indication for CT imaging at this time.  She has had normal CT scans in the recent past including 5 days ago on 07/18/22  After the interventions noted above, I  reevaluated the patient and found that they have: {resolved/improved/worsened:23923::"improved"}  Social Determinants of Health:***  Dispostion:  After consideration of the diagnostic results and the patients response to treatment, I feel that the patent would benefit from ***.   {Document critical care time when appropriate:1} {Document review of labs and clinical decision tools ie heart score, Chads2Vasc2 etc:1}  {Document your independent review of radiology images, and any outside records:1} {Document your discussion with family members, caretakers, and with consultants:1} {Document social determinants of health affecting pt's care:1} {Document your decision making why or why not admission, treatments were needed:1} Final Clinical Impression(s) / ED Diagnoses Final diagnoses:  None    Rx / DC Orders ED Discharge Orders     None

## 2022-07-25 NOTE — ED Triage Notes (Signed)
Patient is here for evaluation of nausea and vomiting. Reports this has been going on since last night. Denies any bowel or bladder issues. States her stomach has been hurting as well. Unable to keep anything down.

## 2022-07-26 ENCOUNTER — Emergency Department (HOSPITAL_COMMUNITY): Payer: Medicare Other

## 2022-07-26 ENCOUNTER — Encounter (HOSPITAL_COMMUNITY): Payer: Self-pay

## 2022-07-26 ENCOUNTER — Encounter (HOSPITAL_COMMUNITY): Payer: Self-pay | Admitting: Pharmacy Technician

## 2022-07-26 ENCOUNTER — Emergency Department (HOSPITAL_COMMUNITY)
Admission: EM | Admit: 2022-07-26 | Discharge: 2022-07-27 | Disposition: A | Discharge status: Home / Self Care | Payer: Medicare Other | Source: Home / Self Care | Attending: Emergency Medicine | Admitting: Emergency Medicine

## 2022-07-26 ENCOUNTER — Other Ambulatory Visit: Payer: Self-pay

## 2022-07-26 DIAGNOSIS — R112 Nausea with vomiting, unspecified: Secondary | ICD-10-CM | POA: Insufficient documentation

## 2022-07-26 DIAGNOSIS — E876 Hypokalemia: Secondary | ICD-10-CM

## 2022-07-26 DIAGNOSIS — G8929 Other chronic pain: Secondary | ICD-10-CM

## 2022-07-26 DIAGNOSIS — Z79899 Other long term (current) drug therapy: Secondary | ICD-10-CM | POA: Insufficient documentation

## 2022-07-26 LAB — CBC WITH DIFFERENTIAL/PLATELET
Abs Immature Granulocytes: 0.06 10*3/uL (ref 0.00–0.07)
Basophils Absolute: 0.1 10*3/uL (ref 0.0–0.1)
Basophils Relative: 0 %
Eosinophils Absolute: 0 10*3/uL (ref 0.0–0.5)
Eosinophils Relative: 0 %
HCT: 38.6 % (ref 36.0–46.0)
Hemoglobin: 12.1 g/dL (ref 12.0–15.0)
Immature Granulocytes: 0 %
Lymphocytes Relative: 13 %
Lymphs Abs: 1.7 10*3/uL (ref 0.7–4.0)
MCH: 30.5 pg (ref 26.0–34.0)
MCHC: 31.3 g/dL (ref 30.0–36.0)
MCV: 97.2 fL (ref 80.0–100.0)
Monocytes Absolute: 1.6 10*3/uL — ABNORMAL HIGH (ref 0.1–1.0)
Monocytes Relative: 12 %
Neutro Abs: 10 10*3/uL — ABNORMAL HIGH (ref 1.7–7.7)
Neutrophils Relative %: 75 %
Platelets: 394 10*3/uL (ref 150–400)
RBC: 3.97 MIL/uL (ref 3.87–5.11)
RDW: 16.3 % — ABNORMAL HIGH (ref 11.5–15.5)
WBC: 13.4 10*3/uL — ABNORMAL HIGH (ref 4.0–10.5)
nRBC: 0 % (ref 0.0–0.2)

## 2022-07-26 LAB — URINALYSIS, ROUTINE W REFLEX MICROSCOPIC
Bilirubin Urine: NEGATIVE
Glucose, UA: NEGATIVE mg/dL
Hgb urine dipstick: NEGATIVE
Ketones, ur: 5 mg/dL — AB
Leukocytes,Ua: NEGATIVE
Nitrite: NEGATIVE
Protein, ur: NEGATIVE mg/dL
Specific Gravity, Urine: 1.033 — ABNORMAL HIGH (ref 1.005–1.030)
pH: 6 (ref 5.0–8.0)

## 2022-07-26 LAB — COMPREHENSIVE METABOLIC PANEL
ALT: 27 U/L (ref 0–44)
AST: 32 U/L (ref 15–41)
Albumin: 3 g/dL — ABNORMAL LOW (ref 3.5–5.0)
Alkaline Phosphatase: 96 U/L (ref 38–126)
Anion gap: 12 (ref 5–15)
BUN: <5 mg/dL — ABNORMAL LOW (ref 6–20)
CO2: 26 mmol/L (ref 22–32)
Calcium: 8.7 mg/dL — ABNORMAL LOW (ref 8.9–10.3)
Chloride: 106 mmol/L (ref 98–111)
Creatinine, Ser: 0.62 mg/dL (ref 0.44–1.00)
GFR, Estimated: >60 mL/min (ref >60)
Glucose, Bld: 106 mg/dL — ABNORMAL HIGH (ref 70–99)
Potassium: 2.9 mmol/L — ABNORMAL LOW (ref 3.5–5.1)
Sodium: 144 mmol/L (ref 135–145)
Total Bilirubin: 0.7 mg/dL (ref 0.3–1.2)
Total Protein: 7.2 g/dL (ref 6.5–8.1)

## 2022-07-26 MED ORDER — HYDROMORPHONE HCL 2 MG/ML IJ SOLN
2.0000 mg | Freq: Once | INTRAMUSCULAR | Status: AC
  Administered 2022-07-26: 2 mg via INTRAMUSCULAR
  Filled 2022-07-26: qty 1

## 2022-07-26 MED ORDER — DROPERIDOL 2.5 MG/ML IJ SOLN
2.5000 mg | Freq: Once | INTRAMUSCULAR | Status: AC
  Administered 2022-07-26: 2.5 mg via INTRAMUSCULAR
  Filled 2022-07-26: qty 2

## 2022-07-26 MED ORDER — SODIUM CHLORIDE 0.9 % IV BOLUS
1000.0000 mL | Freq: Once | INTRAVENOUS | Status: AC
  Administered 2022-07-26: 1000 mL via INTRAVENOUS

## 2022-07-26 MED ORDER — IOHEXOL 300 MG/ML  SOLN
100.0000 mL | Freq: Once | INTRAMUSCULAR | Status: AC | PRN
  Administered 2022-07-26: 100 mL via INTRAVENOUS

## 2022-07-26 MED ORDER — SODIUM CHLORIDE (PF) 0.9 % IJ SOLN
INTRAMUSCULAR | Status: AC
  Filled 2022-07-26: qty 50

## 2022-07-26 MED ORDER — METOCLOPRAMIDE HCL 5 MG/ML IJ SOLN
10.0000 mg | Freq: Once | INTRAMUSCULAR | Status: AC
  Administered 2022-07-26: 10 mg via INTRAVENOUS
  Filled 2022-07-26: qty 2

## 2022-07-26 MED ORDER — SODIUM CHLORIDE 0.9 % IV BOLUS
1000.0000 mL | Freq: Once | INTRAVENOUS | Status: AC
  Administered 2022-07-27: 1000 mL via INTRAVENOUS

## 2022-07-26 MED ORDER — HYDROMORPHONE HCL 1 MG/ML IJ SOLN
1.0000 mg | Freq: Once | INTRAMUSCULAR | Status: AC
  Administered 2022-07-26: 1 mg via INTRAVENOUS
  Filled 2022-07-26: qty 1

## 2022-07-26 MED ORDER — KETOROLAC TROMETHAMINE 30 MG/ML IJ SOLN
30.0000 mg | Freq: Once | INTRAMUSCULAR | Status: AC
  Administered 2022-07-26: 30 mg via INTRAVENOUS
  Filled 2022-07-26: qty 1

## 2022-07-26 NOTE — Discharge Instructions (Addendum)
Your testing today is reassuring.  No evidence of urinary tract infection.  Your CT scan does not show any acute surgical problems.  Call the gastroenterologist for a follow-up appointment and take your medications as prescribed. Return to the ED with intractable pain, intractable vomiting or other concerns.

## 2022-07-26 NOTE — ED Notes (Signed)
Pt's SPO2 dropped to 76% on RA. Pt was instructed to take deep breathes and it went up to a high of 85%. Pt was put on oxygen via [place] at 2 LPM. SPO2 went up to 95% after intervention. Provider notified.

## 2022-07-26 NOTE — ED Provider Notes (Signed)
Care assumed from Dr. Langston Masker.  Patient with recurrent nausea and vomiting with concern for possible gastroparesis.  Recent admission for same.  Labs are reassuring.  Pending improvement of symptoms.  No further vomiting in the ED.  Tolerating p.o. fluids.  Urinalysis is obtained and negative for infection.  Abdomen soft without peritoneal signs. CT scan shows colon decompression without acute findings.  Patient tolerating p.o. and abdomen is soft and nontender.  Tolerating p.o.  Patient agreeable to discharge home.  States she has her antiemetics at home.  Follow-up with PCP as well as gastroenterology.  Return precautions discussed.   Ezequiel Essex, MD 07/26/22 [phone]

## 2022-07-26 NOTE — ED Provider Notes (Signed)
  Provider Note MRN:  [id number]  Arrival date & time: 07/27/22    ED Course and Medical Decision Making  Assumed care from Trifan at shift change.  See note from prior team for complete details, in brief:  Hx chronic pain, cyclical vomiting, gastroparesis Discharged last week with similar Was here last night w/ same, CT yestd wnl Here w/ nausea/vomiting Reassess after further anti-emetics PO chall   1:23 AM On recheck reports that she is feeling better, nausea has subsided.  She is able to tolerate p.o. intake including potassium replacement orally. Nausea greatly improved, abdominal pain improved. She reports that her husband will come pick her up. Recommend bland diet, supportive care over the next few days was discussed, follow-up with GI, given refill on Phenergan suppository, start Carafate.  Rehydration encouraged Abd Soft, nonperitoneal  The patient improved significantly and was discharged in stable condition. Detailed discussions were had with the patient regarding current findings, and need for close f/u with PCP or on call doctor. The patient has been instructed to return immediately if the symptoms worsen in any way for re-evaluation. Patient verbalized understanding and is in agreement with current care plan. All questions answered prior to discharge.    Procedures  Final Clinical Impressions(s) / ED Diagnoses     ICD-10-CM   1. Nausea and vomiting, unspecified vomiting type  R11.2     2. Hypokalemia  E87.6     3. Chronic abdominal pain  R10.9    G89.29       ED Discharge Orders          Ordered    promethazine (PHENERGAN) 25 MG suppository  Every 6 hours PRN        07/27/22 0110    sucralfate (CARAFATE) 1 g tablet  3 times daily with meals        07/27/22 0110    potassium chloride SA (KLOR-CON M) 20 MEQ tablet  Daily        07/27/22 0110    dicyclomine (BENTYL) 20 MG tablet  2 times daily        07/27/22 0123              Discharge Instructions       It was a pleasure caring for you today in the emergency department.  Please return to the emergency department for any worsening or worrisome symptoms.  Please follow-up with your PCP for repeat potassium check in the next 3 days           Sloan Leiter, DO 07/27/22 0123

## 2022-07-26 NOTE — ED Notes (Signed)
Ultrasound iv placement unsuccessful times two, provider notified.

## 2022-07-26 NOTE — ED Notes (Signed)
Patient given ginger ale for PO fluid challenge.  

## 2022-07-26 NOTE — ED Triage Notes (Signed)
Pt here with ongoing nausea and vomiting. Seen this morning for the same. States unable to tolerate PO intake. Taking antiemetics at home without improvement.

## 2022-07-26 NOTE — ED Notes (Signed)
Patient transported to CT 

## 2022-07-26 NOTE — ED Provider Notes (Signed)
[facility] Provider Note   CSN: [id number] Arrival date & time: 07/26/22  1715     History Chief Complaint  Patient presents with   Emesis    AMELIAH UPTON is a 52 y.o. female patient who is well-known to the emergency department with history of gastroparesis who presents to the emergency department today for intractable nausea and vomiting.  Patient was seen in the emergency department yesterday for similar symptoms.  She was ultimately discharged home and woke up this morning with similar complaints.  She has been unable to keep anything down since this morning. She denies any fever or chills.    Emesis      Home Medications Prior to Admission medications   Medication Sig Start Date End Date Taking? Authorizing Provider  acetaminophen (TYLENOL) 650 MG CR tablet Take 1,300 mg by mouth as needed for pain.    [provider]  ARIPiprazole (ABILIFY) 5 MG tablet Take 1 tablet (5 mg total) by mouth daily. 08/01/20   Jamse Arn, MD  atorvastatin (LIPITOR) 10 MG tablet Take 10 mg by mouth every evening. 08/26/20   [provider]  FLUoxetine (PROZAC) 40 MG capsule Take 1 capsule (40 mg total) by mouth daily. 07/01/20   Angiulli, Lavon Paganini, PA-C  ibuprofen (ADVIL) 200 MG tablet Take 400 mg by mouth 3 (three) times daily as needed for moderate pain.    [provider]  levothyroxine (SYNTHROID) 50 MCG tablet Take 50 mcg by mouth daily before breakfast.    [provider]  lidocaine (LIDODERM) 5 % Place 1 patch onto the skin daily. Remove & Discard patch within 12 hours or as directed by MD Patient taking differently: Place 1 patch onto the skin as needed (pain). 07/01/22   Shalhoub, Sherryll Burger, MD  LIDOCAINE EX Apply 1 Application topically as needed (pain). Cream    [provider]  metoCLOPramide (REGLAN) 5 MG tablet Take 5 mg by mouth 2 (two) times daily. 07/10/22   [provider]   metoprolol succinate (TOPROL-XL) 25 MG 24 hr tablet Take 1 tablet (25 mg total) by mouth daily. Follow up with your PCP regarding your heart rate and whether this should be continued. Patient taking differently: Take 25 mg by mouth every evening. 07/14/22 08/13/22  Elodia Florence., MD  multivitamin (RENA-VIT) TABS tablet Take 1 tablet by mouth at bedtime. 08/01/20   Jamse Arn, MD  ondansetron (ZOFRAN-ODT) 8 MG disintegrating tablet Take 1 tablet (8 mg total) by mouth every 8 (eight) hours as needed for nausea or vomiting. Patient taking differently: Take 8 mg by mouth as needed for nausea or vomiting. 05/03/22   Thornton Park, MD  oxyCODONE-acetaminophen (PERCOCET) 7.5-325 MG tablet Take 1 tablet by mouth 4 (four) times daily.    [provider]  pantoprazole (PROTONIX) 40 MG tablet Take 1 tablet (40 mg total) by mouth 2 (two) times daily. 05/03/22 05/03/23  Thornton Park, MD  prazosin (MINIPRESS) 2 MG capsule Take 1 capsule (2 mg total) by mouth at bedtime. 08/01/20   Jamse Arn, MD  promethazine (PHENERGAN) 25 MG suppository Place 1 suppository (25 mg total) rectally every 6 (six) hours as needed for nausea or vomiting. Patient taking differently: Place 25 mg rectally as needed for nausea or vomiting. 12/27/21   Drenda Freeze, MD  promethazine (PHENERGAN) 25 MG tablet Take 25 mg by mouth as needed for nausea or vomiting.    [provider]  tiZANidine (ZANAFLEX) 4 MG tablet Take 3 tablets (12 mg total) by mouth every 8 (eight) hours as needed. 07/20/22   Dwyane Dee, MD  traZODone (DESYREL) 150 MG tablet Take 150 mg by mouth at bedtime.    [provider]      Allergies    Patient has no known allergies.    Review of Systems   Review of Systems  Gastrointestinal:  Positive for vomiting.  All other systems reviewed and are negative.   Physical Exam Updated Vital Signs BP (!) 175/99 (BP Location: Left Arm)   Pulse 95   Temp 98.2 F  (36.8 C) (Oral)   Resp 18   LMP 06/03/2018 (Approximate) Comment: neg hcg 05/10/20  SpO2 99%  Physical Exam Vitals and nursing note reviewed.  Constitutional:      General: She is in acute distress.     Appearance: Normal appearance.  HENT:     Head: Normocephalic and atraumatic.  Eyes:     General:        Right eye: No discharge.        Left eye: No discharge.  Cardiovascular:     Comments: Regular rate and rhythm.  S1/S2 are distinct without any evidence of murmur, rubs, or gallops.  Radial pulses are 2+ bilaterally.  Dorsalis pedis pulses are 2+ bilaterally.  No evidence of pedal edema. Pulmonary:     Comments: Clear to auscultation bilaterally.  Normal effort.  No respiratory distress.  No evidence of wheezes, rales, or rhonchi heard throughout. Abdominal:     General: Abdomen is flat. Bowel sounds are normal. There is no distension.     Tenderness: There is no abdominal tenderness. There is no guarding or rebound.  Musculoskeletal:        General: Normal range of motion.     Cervical back: Neck supple.  Skin:    General: Skin is warm and dry.     Findings: No rash.  Neurological:     General: No focal deficit present.     Mental Status: She is alert.  Psychiatric:        Mood and Affect: Mood normal.        Behavior: Behavior normal.     ED Results / Procedures / Treatments   Labs (all labs ordered are listed, but only abnormal results are displayed) Labs Reviewed  CBC WITH DIFFERENTIAL/PLATELET  COMPREHENSIVE METABOLIC PANEL    EKG None  Radiology CT ABDOMEN PELVIS W CONTRAST  Result Date: 07/26/2022 CLINICAL DATA:  52 year old female with abdominal pain for over 24 hours. EXAM: CT ABDOMEN AND PELVIS WITH CONTRAST TECHNIQUE: Multidetector CT imaging of the abdomen and pelvis was performed using the standard protocol following bolus administration of intravenous contrast. RADIATION DOSE REDUCTION: This exam was performed according to the departmental  dose-optimization program which includes automated exposure control, adjustment of the mA and/or kV according to patient size and/or use of iterative reconstruction technique. CONTRAST:  128mL OMNIPAQUE IOHEXOL 300 MG/ML  SOLN COMPARISON:  CT Abdomen and Pelvis 07/18/2022 and earlier. FINDINGS: Lower chest: Small right pleural effusion has virtually resolved since last month. Lung bases now negative. Cardiac size remains within normal limits. No pericardial effusion. Hepatobiliary: Chronic hepatic steatosis.  Negative gallbladder. Pancreas: Negative. Spleen: Negative. Adrenals/Urinary Tract: Negative. Symmetric renal enhancement. Mildly distended but otherwise unremarkable bladder. Stomach/Bowel: Negative rectum. From the ascending to the sigmoid colon large bowel is diffusely decompressed and appears mildly indistinct (series 2, image 40, sigmoid colon image  75). Cecum and retrocecal appendix remain normal (series 2, image 59). Nondilated terminal ileum. No dilated small bowel. Stomach and duodenum appear negative. No free air or free fluid. Vascular/Lymphatic: Abdominal aortic caliber remains normal. Mild Calcified aortic atherosclerosis. Major arterial structures and the portal venous system appear to be patent. No lymphadenopathy identified. Reproductive: Within normal limits. Other: No pelvis free fluid. Musculoskeletal: L4 compression fracture is new since November, but appears stable from last month. This was thought to be acute on CT lumbar spine 06/25/2022 with fall at that time. Chronic L3 superior endplate compression and/or Schmorl's node. And chronic T11 similar endplate compression or Schmorl's node stable from November. Chronic bilateral pubic rami fractures. No acute osseous abnormality identified. IMPRESSION: 1. Decompressed and mildly indistinct large bowel from the ascending through to the sigmoid colon. Mild acute colitis cannot be excluded. And this appearance is similar to several prior exams.  2. No other acute or inflammatory process identified in the abdomen or pelvis. Small right pleural effusion has resolved since last month. Chronic hepatic steatosis. Recent L4 compression fracture. Aortic Atherosclerosis (ICD10-I70.0). Electronically Signed   By: Genevie Ann M.D.   On: 07/26/2022 05:16    Procedures Procedures    Medications Ordered in ED Medications  droperidol (INAPSINE) 2.5 MG/ML injection 2.5 mg (2.5 mg Intramuscular Given 07/26/22 1923)  HYDROmorphone (DILAUDID) injection 2 mg (2 mg Intramuscular Given 07/26/22 2058)    ED Course/ Medical Decision Making/ A&P Clinical Course as of 07/26/22 2148  Thu Jul 26, 2022  1826 Very difficult to obtain venous access.  Myself and nursing staff looked with ultrasound guidance and after several attempts were unsuccessful.  Fortunately, the patient is presenting with similar symptoms that she presents with frequently in the ER related to her gastroparesis.  I will plan to get EKG to evaluate her QT interval and plan to give her some IM medications. [CF]  2041 Show decision making was done with the patient at the bedside.  We discussed that since I am unable to get IV access and I am unable to get blood is going be very difficult to admit her.  We are both in agreement that we would like to get her discharged so she can go home.  Patient is on chronic pain medication at home and we came to an agreement that we will try 1 round of Dilaudid 2 mg IM and plan to reassess with hopes to discharge afterwards.  Patient is in agreement with plan. [CF]  2130 Patient is no better after intramuscular Dilaudid.  I am going to consult the IV team this week and get an IV established and hopefully get her admitted for intractable vomiting and pain. [CF]    Clinical Course User Index [CF] Hendricks Limes, PA-C   {   Click here for ABCD2, HEART and other calculators  Medical Decision Making CIYA FRANCKOWIAK is a 52 y.o. female patient who presents to the  emergency department today for further evaluation of intractable nausea and vomiting.  Seems to be consistent with the patient's frequent visits.  Has had an ED course, we are working on getting an IV in place.  Given her multiple rounds of pain medication with little improvement.  Will plan to get basic labs once IV is placed and get her admitted.  Due to shift change the rest of her care be transferred to oncoming provider.   Amount and/or Complexity of Data Reviewed Labs: ordered.  Risk Prescription drug management.  Final Clinical Impression(s) / ED Diagnoses Final diagnoses:  None    Rx / DC Orders ED Discharge Orders     None         Jolyn Lent 07/26/22 2148    Terald Sleeper, MD 07/27/22 [phone]

## 2022-07-27 MED ORDER — POTASSIUM CHLORIDE CRYS ER 20 MEQ PO TBCR: 20.0000 meq | EXTENDED_RELEASE_TABLET | Freq: Every day | ORAL | 0 refills | Status: DC

## 2022-07-27 MED ORDER — SUCRALFATE 1 G PO TABS: 1.0000 g | ORAL_TABLET | Freq: Three times a day (TID) | ORAL | 0 refills | Status: DC

## 2022-07-27 MED ORDER — PROMETHAZINE HCL 25 MG RE SUPP: 25.0000 mg | Freq: Four times a day (QID) | RECTAL | 0 refills | Status: DC | PRN

## 2022-07-27 MED ORDER — DICYCLOMINE HCL 20 MG PO TABS: 20.0000 mg | ORAL_TABLET | Freq: Two times a day (BID) | ORAL | 0 refills | Status: DC

## 2022-07-27 MED ORDER — TIZANIDINE HCL 4 MG PO TABS
4.0000 mg | ORAL_TABLET | Freq: Once | ORAL | Status: AC
  Administered 2022-07-27: 4 mg via ORAL
  Filled 2022-07-27: qty 1

## 2022-07-27 MED ORDER — POTASSIUM CHLORIDE CRYS ER 20 MEQ PO TBCR
40.0000 meq | EXTENDED_RELEASE_TABLET | Freq: Once | ORAL | Status: AC
  Administered 2022-07-27: 40 meq via ORAL
  Filled 2022-07-27: qty 2

## 2022-07-27 MED ORDER — POTASSIUM CHLORIDE 10 MEQ/100ML IV SOLN
10.0000 meq | Freq: Once | INTRAVENOUS | Status: AC
  Administered 2022-07-27: 10 meq via INTRAVENOUS
  Filled 2022-07-27: qty 100

## 2022-07-27 NOTE — Discharge Instructions (Addendum)
It was a pleasure caring for you today in the emergency department.  Please return to the emergency department for any worsening or worrisome symptoms.  Please follow-up with your PCP for repeat potassium check in the next 3 days  Please follow up with stomach doctor/GI doctor

## 2022-08-04 ENCOUNTER — Emergency Department (HOSPITAL_COMMUNITY)
Admission: EM | Admit: 2022-08-04 | Discharge: 2022-08-04 | Disposition: A | Discharge status: Home / Self Care | Payer: Medicare Other | Attending: Emergency Medicine | Admitting: Emergency Medicine

## 2022-08-04 ENCOUNTER — Other Ambulatory Visit: Payer: Self-pay

## 2022-08-04 ENCOUNTER — Encounter (HOSPITAL_COMMUNITY): Payer: Self-pay

## 2022-08-04 ENCOUNTER — Emergency Department (HOSPITAL_COMMUNITY): Payer: Medicare Other

## 2022-08-04 DIAGNOSIS — R112 Nausea with vomiting, unspecified: Secondary | ICD-10-CM | POA: Insufficient documentation

## 2022-08-04 DIAGNOSIS — R1084 Generalized abdominal pain: Secondary | ICD-10-CM | POA: Insufficient documentation

## 2022-08-04 DIAGNOSIS — E119 Type 2 diabetes mellitus without complications: Secondary | ICD-10-CM | POA: Diagnosis not present

## 2022-08-04 DIAGNOSIS — R109 Unspecified abdominal pain: Secondary | ICD-10-CM

## 2022-08-04 DIAGNOSIS — I2510 Atherosclerotic heart disease of native coronary artery without angina pectoris: Secondary | ICD-10-CM | POA: Diagnosis not present

## 2022-08-04 LAB — URINALYSIS, ROUTINE W REFLEX MICROSCOPIC
Bacteria, UA: NONE SEEN
Bilirubin Urine: NEGATIVE
Glucose, UA: NEGATIVE mg/dL
Hgb urine dipstick: NEGATIVE
Ketones, ur: NEGATIVE mg/dL
Nitrite: NEGATIVE
Protein, ur: NEGATIVE mg/dL
Specific Gravity, Urine: 1.006 (ref 1.005–1.030)
pH: 6 (ref 5.0–8.0)

## 2022-08-04 LAB — LIPASE, BLOOD: Lipase: 19 U/L (ref 11–51)

## 2022-08-04 LAB — COMPREHENSIVE METABOLIC PANEL
ALT: 54 U/L — ABNORMAL HIGH (ref 0–44)
AST: 86 U/L — ABNORMAL HIGH (ref 15–41)
Albumin: 3.1 g/dL — ABNORMAL LOW (ref 3.5–5.0)
Alkaline Phosphatase: 120 U/L (ref 38–126)
Anion gap: 13 (ref 5–15)
BUN: <5 mg/dL — ABNORMAL LOW (ref 6–20)
CO2: 25 mmol/L (ref 22–32)
Calcium: 8.7 mg/dL — ABNORMAL LOW (ref 8.9–10.3)
Chloride: 101 mmol/L (ref 98–111)
Creatinine, Ser: 0.74 mg/dL (ref 0.44–1.00)
GFR, Estimated: >60 mL/min (ref >60)
Glucose, Bld: 114 mg/dL — ABNORMAL HIGH (ref 70–99)
Potassium: 3.7 mmol/L (ref 3.5–5.1)
Sodium: 139 mmol/L (ref 135–145)
Total Bilirubin: 0.7 mg/dL (ref 0.3–1.2)
Total Protein: 7.7 g/dL (ref 6.5–8.1)

## 2022-08-04 LAB — CBC
HCT: 42.2 % (ref 36.0–46.0)
Hemoglobin: 13.8 g/dL (ref 12.0–15.0)
MCH: 31.8 pg (ref 26.0–34.0)
MCHC: 32.7 g/dL (ref 30.0–36.0)
MCV: 97.2 fL (ref 80.0–100.0)
Platelets: 347 10*3/uL (ref 150–400)
RBC: 4.34 MIL/uL (ref 3.87–5.11)
RDW: 15.4 % (ref 11.5–15.5)
WBC: 11 10*3/uL — ABNORMAL HIGH (ref 4.0–10.5)
nRBC: 0 % (ref 0.0–0.2)

## 2022-08-04 MED ORDER — DROPERIDOL 2.5 MG/ML IJ SOLN
2.5000 mg | Freq: Once | INTRAMUSCULAR | Status: AC
  Administered 2022-08-04: 2.5 mg via INTRAVENOUS
  Filled 2022-08-04: qty 2

## 2022-08-04 MED ORDER — ACETAMINOPHEN 500 MG PO TABS
1000.0000 mg | ORAL_TABLET | Freq: Once | ORAL | Status: AC
  Administered 2022-08-04: 1000 mg via ORAL
  Filled 2022-08-04: qty 2

## 2022-08-04 MED ORDER — METOCLOPRAMIDE HCL 5 MG/ML IJ SOLN
5.0000 mg | Freq: Once | INTRAMUSCULAR | Status: DC
  Filled 2022-08-04: qty 2

## 2022-08-04 MED ORDER — MORPHINE SULFATE (PF) 4 MG/ML IV SOLN
4.0000 mg | Freq: Once | INTRAVENOUS | Status: DC
  Filled 2022-08-04: qty 1

## 2022-08-04 MED ORDER — MORPHINE SULFATE (PF) 4 MG/ML IV SOLN: 4.0000 mg | Freq: Once | INTRAVENOUS | Status: DC

## 2022-08-04 MED ORDER — SODIUM CHLORIDE 0.9 % IV BOLUS
1000.0000 mL | Freq: Once | INTRAVENOUS | Status: AC
  Administered 2022-08-04: 1000 mL via INTRAVENOUS

## 2022-08-04 MED ORDER — ONDANSETRON HCL 4 MG PO TABS: 4.0000 mg | ORAL_TABLET | Freq: Four times a day (QID) | ORAL | 0 refills | Status: DC

## 2022-08-04 MED ORDER — IOHEXOL 300 MG/ML  SOLN
100.0000 mL | Freq: Once | INTRAMUSCULAR | Status: AC | PRN
  Administered 2022-08-04: 100 mL via INTRAVENOUS

## 2022-08-04 MED ORDER — KETOROLAC TROMETHAMINE 15 MG/ML IJ SOLN
15.0000 mg | Freq: Once | INTRAMUSCULAR | Status: AC
  Administered 2022-08-04: 15 mg via INTRAVENOUS
  Filled 2022-08-04: qty 1

## 2022-08-04 NOTE — ED Notes (Signed)
Pt able to ambulate to restroom with no assistant  

## 2022-08-04 NOTE — ED Notes (Signed)
Patient transported to CT 

## 2022-08-04 NOTE — Discharge Instructions (Addendum)
Evaluation for your abdominal pain, nausea and vomiting was overall reassuring.  CT scan was negative for any acute process that could be contributing to your symptoms.  Recommend you continue hydration at home and I have sent Zofran to your pharmacy to help with nausea and vomiting.  Recommend you follow-up with your PCP.  If you have worsening abdominal pain, inability to tolerate fluid intake or any other concerning symptom please return emergency department for further evaluation.

## 2022-08-04 NOTE — ED Notes (Signed)
Pt stated no urine at this time. Will try again later

## 2022-08-04 NOTE — ED Notes (Signed)
Pt. Refuses blood work at this time until she has an IV.

## 2022-08-04 NOTE — ED Provider Notes (Signed)
[place] EMERGENCY DEPARTMENT AT [facility] Provider Note   CSN: [id number] Arrival date & time: 08/04/22  4098     History  Chief Complaint  Patient presents with   Emesis   HPI [name] is a 52 y.o. female with Barrett's esophagus, CAD, drug-seeking behavior, and diabetes presenting for emesis.  Started acutely yesterday and nonbilious and nonbloody.  Denies diarrhea.  States she had a normal bowel movement this morning.  Denies any urinary changes.  States she is also having some abdominal pain in the right lower quadrant that radiates to the left lower quadrant.  It is sharp.  Denies fever at home.  States she has not been able to keep anything down.  Denies sick contacts or recent travel.   Emesis      Home Medications Prior to Admission medications   Medication Sig Start Date End Date Taking? Authorizing Provider  ondansetron (ZOFRAN) 4 MG tablet Take 1 tablet (4 mg total) by mouth every 6 (six) hours. 08/04/22  Yes Gareth Eagle, PA-C  acetaminophen (TYLENOL) 650 MG CR tablet Take 1,300 mg by mouth as needed for pain.    [provider]  ARIPiprazole (ABILIFY) 5 MG tablet Take 1 tablet (5 mg total) by mouth daily. 08/01/20   Marcello Fennel, MD  atorvastatin (LIPITOR) 10 MG tablet Take 10 mg by mouth every evening. 08/26/20   [provider]  dicyclomine (BENTYL) 20 MG tablet Take 1 tablet (20 mg total) by mouth 2 (two) times daily for 5 days. 07/27/22 08/01/22  Sloan Leiter, DO  FLUoxetine (PROZAC) 40 MG capsule Take 1 capsule (40 mg total) by mouth daily. 07/01/20   Angiulli, Mcarthur Rossetti, PA-C  ibuprofen (ADVIL) 200 MG tablet Take 400 mg by mouth 3 (three) times daily as needed for moderate pain.    [provider]  levothyroxine (SYNTHROID) 50 MCG tablet Take 50 mcg by mouth daily before breakfast.    [provider]  lidocaine (LIDODERM) 5 % Place 1 patch onto the skin daily. Remove & Discard patch within 12 hours or as  directed by MD Patient taking differently: Place 1 patch onto the skin as needed (pain). 07/01/22   Shalhoub, Deno Lunger, MD  LIDOCAINE EX Apply 1 Application topically as needed (pain). Cream    [provider]  metoCLOPramide (REGLAN) 5 MG tablet Take 5 mg by mouth 2 (two) times daily. 07/10/22   [provider]  metoprolol succinate (TOPROL-XL) 25 MG 24 hr tablet Take 1 tablet (25 mg total) by mouth daily. Follow up with your PCP regarding your heart rate and whether this should be continued. Patient taking differently: Take 25 mg by mouth every evening. 07/14/22 08/13/22  Zigmund Daniel., MD  multivitamin (RENA-VIT) TABS tablet Take 1 tablet by mouth at bedtime. 08/01/20   Marcello Fennel, MD  ondansetron (ZOFRAN-ODT) 8 MG disintegrating tablet Take 1 tablet (8 mg total) by mouth every 8 (eight) hours as needed for nausea or vomiting. Patient taking differently: Take 8 mg by mouth as needed for nausea or vomiting. 05/03/22   Tressia Danas, MD  oxyCODONE-acetaminophen (PERCOCET) 7.5-325 MG tablet Take 1 tablet by mouth 4 (four) times daily.    [provider]  pantoprazole (PROTONIX) 40 MG tablet Take 1 tablet (40 mg total) by mouth 2 (two) times daily. 05/03/22 05/03/23  Tressia Danas, MD  potassium chloride SA (KLOR-CON M) 20 MEQ tablet Take 1 tablet (20 mEq total) by mouth  daily for 3 days. 07/27/22 07/30/22  Sloan Leiter, DO  prazosin (MINIPRESS) 2 MG capsule Take 1 capsule (2 mg total) by mouth at bedtime. 08/01/20   Marcello Fennel, MD  promethazine (PHENERGAN) 25 MG suppository Place 1 suppository (25 mg total) rectally every 6 (six) hours as needed for nausea or vomiting. 07/27/22   Sloan Leiter, DO  promethazine (PHENERGAN) 25 MG tablet Take 25 mg by mouth as needed for nausea or vomiting.    [provider]  sucralfate (CARAFATE) 1 g tablet Take 1 tablet (1 g total) by mouth with breakfast, with lunch, and with evening meal for 7 days. 07/27/22  08/03/22  Sloan Leiter, DO  tiZANidine (ZANAFLEX) 4 MG tablet Take 3 tablets (12 mg total) by mouth every 8 (eight) hours as needed. 07/20/22   Lewie Chamber, MD  traZODone (DESYREL) 150 MG tablet Take 150 mg by mouth at bedtime.    [provider]      Allergies    Patient has no known allergies.    Review of Systems   Review of Systems  Gastrointestinal:  Positive for vomiting.    Physical Exam   Vitals:   08/04/22 0718 08/04/22 0822  BP: (!) 158/114 (!) 168/83  Pulse: (!) 124 99  Resp: 20 20  Temp: 98.4 F (36.9 C)   SpO2: 98% 100%    CONSTITUTIONAL:  well-appearing, NAD NEURO:  Alert and oriented x 3, CN 3-12 grossly intact EYES:  eyes equal and reactive ENT/NECK:  Supple, no stridor  CARDIO:  tachycardic and regular rhythm, appears well-perfused  PULM:  No respiratory distress, CTAB GI/GU:  non-distended, soft, generalized lower abdominal tenderness MSK/SPINE:  No gross deformities, no edema, moves all extremities  SKIN:  no rash, atraumatic   *Additional and/or pertinent findings included in MDM below    ED Results / Procedures / Treatments   Labs (all labs ordered are listed, but only abnormal results are displayed) Labs Reviewed  COMPREHENSIVE METABOLIC PANEL - Abnormal; Notable for the following components:      Result Value   Glucose, Bld 114 (*)    BUN <5 (*)    Calcium 8.7 (*)    Albumin 3.1 (*)    AST 86 (*)    ALT 54 (*)    All other components within normal limits  CBC - Abnormal; Notable for the following components:   WBC 11.0 (*)    All other components within normal limits  LIPASE, BLOOD  URINALYSIS, ROUTINE W REFLEX MICROSCOPIC    EKG None  Radiology CT Abdomen Pelvis W Contrast  Result Date: 08/04/2022 CLINICAL DATA:  Acute generalized abdominal pain. EXAM: CT ABDOMEN AND PELVIS WITH CONTRAST TECHNIQUE: Multidetector CT imaging of the abdomen and pelvis was performed using the standard protocol following bolus  administration of intravenous contrast. RADIATION DOSE REDUCTION: This exam was performed according to the departmental dose-optimization program which includes automated exposure control, adjustment of the mA and/or kV according to patient size and/or use of iterative reconstruction technique. CONTRAST:  OMNIPAQUE IOHEXOL 300 MG/ML  SOLN COMPARISON:  July 26, 2022. FINDINGS: Lower chest: No acute abnormality. Hepatobiliary: Hepatic steatosis. No cholelithiasis or biliary dilatation is noted. Pancreas: Unremarkable. No pancreatic ductal dilatation or surrounding inflammatory changes. Spleen: Normal in size without focal abnormality. Adrenals/Urinary Tract: Adrenal glands are unremarkable. Kidneys are normal, without renal calculi, focal lesion, or hydronephrosis. Bladder is unremarkable. Stomach/Bowel: Stomach is within normal limits. Appendix appears normal. No evidence of bowel  wall thickening, distention, or inflammatory changes. Vascular/Lymphatic: Aortic atherosclerosis. No enlarged abdominal or pelvic lymph nodes. Reproductive: Uterus and bilateral adnexa are unremarkable. Other: No abdominal wall hernia or abnormality. No abdominopelvic ascites. Musculoskeletal: No acute or significant osseous findings. IMPRESSION: Hepatic steatosis. No acute abnormality seen in the abdomen or pelvis. Aortic Atherosclerosis (ICD10-I70.0). Electronically Signed   By: Lupita Raider M.D.   On: 08/04/2022 10:22    Procedures Procedures    Medications Ordered in ED Medications  sodium chloride 0.9 % bolus 1,000 mL (0 mLs Intravenous Stopped 08/04/22 0940)  ketorolac (TORADOL) 15 MG/ML injection 15 mg (15 mg Intravenous Given 08/04/22 0813)  droperidol (INAPSINE) 2.5 MG/ML injection 2.5 mg (2.5 mg Intravenous Given 08/04/22 0813)  acetaminophen (TYLENOL) tablet 1,000 mg (1,000 mg Oral Given 08/04/22 1047)  iohexol (OMNIPAQUE) 300 MG/ML solution 100 mL (100 mLs Intravenous Contrast Given 08/04/22 0956)    ED  Course/ Medical Decision Making/ A&P Clinical Course as of 08/04/22 1121  Sat Aug 04, 2022  0950 Patient requesting pain medication.  Patient stated that she has not vomited since she has been here.  Treated with Tylenol. [JR]    Clinical Course User Index [JR] Gareth Eagle, PA-C                             Medical Decision Making Amount and/or Complexity of Data Reviewed Radiology: ordered.  Risk OTC drugs. Prescription drug management.   Initial Impression and Ddx 52 year old female who is well-appearing presenting for abdominal pain and vomiting.  Exam remarkable for generalized lower abdominal pain.  DDx includes appendicitis, diverticulitis, nephrolithiasis, ovarian torsion, PID. Patient PMH that increases complexity of ED encounter:  Barrett's esophagus, CAD, drug-seeking behavior, and diabetes  Interpretation of Diagnostics I independent reviewed and interpreted the labs as followed: Leukocytosis but improved from last  - I independently visualized the following imaging with scope of interpretation limited to determining acute life threatening conditions related to emergency care: CT abdomen pelvis, which revealed no acute findings  Patient Reassessment and Ultimate Disposition/Management Patient continues without witnessed occurrence of emesis.  Still endorsing pain. Initially treated with Toradol then Tylenol.  CT scan without any acute findings.  Given patient has been clinically stable without any occurrences of emesis, now volume resuscitated with stable vitals and afebrile, felt it was appropriate for her to be discharged with follow-up with her PCP.  Sent home with Zofran.  Discussed return precautions.   Patient management required discussion with the following services or consulting groups:  None  Complexity of Problems Addressed Acute complicated illness or Injury  Additional Data Reviewed and Analyzed Further history obtained from: Past medical history and  medications listed in the EMR, Prior ED visit notes, and Recent discharge summary  Patient Encounter Risk Assessment Prescriptions         Final Clinical Impression(s) / ED Diagnoses Final diagnoses:  Nausea and vomiting, unspecified vomiting type  Abdominal pain, unspecified abdominal location    Rx / DC Orders ED Discharge Orders          Ordered    ondansetron (ZOFRAN) 4 MG tablet  Every 6 hours        08/04/22 1120              Gareth Eagle, PA-C 08/04/22 1122    Terald Sleeper, MD 08/04/22 1450

## 2022-08-22 ENCOUNTER — Ambulatory Visit: Payer: Medicare Other | Attending: Student | Admitting: Student

## 2022-08-22 NOTE — Progress Notes (Deleted)
   Cardiology Clinic Note   Date: 08/22/2022 ID: KANDEE ESCALANTE, DOB 09/07/1970, MRN [id number]  Primary Cardiologist:  Nanetta Batty, MD  Patient Profile    [name] is a 52 y.o. female who presents to the clinic today for hospital follow-up.  Past medical history significant for: Cardiac arrest 2022. Chronic diastolic heart failure. Echo 06/30/2022: EF 55 to 60%.  Grade I DD.  Severely elevated PA pressure.  Moderate MR.  Mild to moderate AI. Inappropriate sinus tachycardia. Hypertension. Barrett's esophagus. Frequent nausea vomiting.   History of Present Illness    [name] was first evaluated by Dr. Allyson Sabal on 06/08/2020 for cardiac arrest.  Patient presented to the ED via EMS on 06/08/2020.  She had been found on the floor and had been there since sometime in the middle of the night.  Upon EMS arrival patient was gasping for air and was asystole on monitor.  3 minutes of CPR performed with 1 round of epi with ROSC.  Immediately post ROSC EKG was concerning for STEMI but subsequent EKGs improved.  Patient had eyes open and was looking around but not responsive to verbal stimuli or commands.  Patient had been admitted for GI symptoms and COVID the month prior.  Bedside echo revealed normal LV function with normal-appearing RV, no regional wall motion abnormalities.  As STEMI criteria was not met patient was not taken to Cath Lab.  Patient was admitted for sepsis.  UDS was positive for opiates although patient denied use.  Continues to be followed for the above outlined history.  Patient was last seen in the office by Dr. Allyson Sabal on 08/09/2021.  At that time she was doing well and no medication changes were made.  Patient has had 8 ED visits/hospital admissions for intractable/cyclical nausea and vomiting since January 2024.  Last ED visit 08/04/2022.  During hospital visit in March patient was found to be bradycardic.  Metoprolol dose was reduced and ZIO heart monitor ordered.  It  does not appear heart monitor has been completed at this time.  Today, patient ***   ROS: All other systems reviewed and are otherwise negative except as noted in History of Present Illness.  Studies Reviewed    ECG personally reviewed by me today: ***  No significant changes from ***  Risk Assessment/Calculations    {Does this patient have ATRIAL FIBRILLATION?:[id number]} No BP recorded.  {Refresh Note OR Click here to enter BP  :1}***        Physical Exam    VS:  LMP 06/03/2018 (Approximate) Comment: neg hcg 05/10/20 , BMI There is no height or weight on file to calculate BMI.  GEN: Well nourished, well developed, in no acute distress. Neck: No JVD or carotid bruits. Cardiac: *** RRR. No murmurs. No rubs or gallops.   Respiratory:  Respirations regular and unlabored. Clear to auscultation without rales, wheezing or rhonchi. GI: Soft, nontender, nondistended. Extremities: Radials/DP/PT 2+ and equal bilaterally. No clubbing or cyanosis. No edema ***  Skin: Warm and dry, no rash. Neuro: Strength intact.  Assessment & Plan   ***  Disposition: ***     {Are you ordering a CV Procedure (e.g. stress test, cath, DCCV, TEE, etc)?   Press F2        :[id number]}   Signed, Etta Grandchild. Eliza Green, DNP, NP-C

## 2022-08-23 ENCOUNTER — Encounter: Payer: Self-pay | Admitting: Student

## 2022-08-24 ENCOUNTER — Emergency Department (HOSPITAL_COMMUNITY): Payer: Medicare Other

## 2022-08-24 ENCOUNTER — Encounter (HOSPITAL_COMMUNITY): Payer: Self-pay

## 2022-08-24 ENCOUNTER — Emergency Department (HOSPITAL_COMMUNITY)
Admission: EM | Admit: 2022-08-24 | Discharge: 2022-08-24 | Disposition: A | Discharge status: Home / Self Care | Payer: Medicare Other | Attending: Emergency Medicine | Admitting: Emergency Medicine

## 2022-08-24 ENCOUNTER — Other Ambulatory Visit: Payer: Self-pay

## 2022-08-24 DIAGNOSIS — R1084 Generalized abdominal pain: Secondary | ICD-10-CM | POA: Diagnosis not present

## 2022-08-24 DIAGNOSIS — R112 Nausea with vomiting, unspecified: Secondary | ICD-10-CM | POA: Insufficient documentation

## 2022-08-24 LAB — COMPREHENSIVE METABOLIC PANEL
ALT: 25 U/L (ref 0–44)
AST: 39 U/L (ref 15–41)
Albumin: 3.2 g/dL — ABNORMAL LOW (ref 3.5–5.0)
Alkaline Phosphatase: 109 U/L (ref 38–126)
Anion gap: 17 — ABNORMAL HIGH (ref 5–15)
BUN: <5 mg/dL — ABNORMAL LOW (ref 6–20)
CO2: 21 mmol/L — ABNORMAL LOW (ref 22–32)
Calcium: 9.2 mg/dL (ref 8.9–10.3)
Chloride: 102 mmol/L (ref 98–111)
Creatinine, Ser: 0.78 mg/dL (ref 0.44–1.00)
GFR, Estimated: >60 mL/min (ref >60)
Glucose, Bld: 159 mg/dL — ABNORMAL HIGH (ref 70–99)
Potassium: 3.7 mmol/L (ref 3.5–5.1)
Sodium: 140 mmol/L (ref 135–145)
Total Bilirubin: 0.7 mg/dL (ref 0.3–1.2)
Total Protein: 8 g/dL (ref 6.5–8.1)

## 2022-08-24 LAB — CBC
HCT: 44.3 % (ref 36.0–46.0)
Hemoglobin: 14 g/dL (ref 12.0–15.0)
MCH: 31 pg (ref 26.0–34.0)
MCHC: 31.6 g/dL (ref 30.0–36.0)
MCV: 98 fL (ref 80.0–100.0)
Platelets: 401 10*3/uL — ABNORMAL HIGH (ref 150–400)
RBC: 4.52 MIL/uL (ref 3.87–5.11)
RDW: 14.9 % (ref 11.5–15.5)
WBC: 11.5 10*3/uL — ABNORMAL HIGH (ref 4.0–10.5)
nRBC: 0 % (ref 0.0–0.2)

## 2022-08-24 LAB — LIPASE, BLOOD: Lipase: 18 U/L (ref 11–51)

## 2022-08-24 MED ORDER — HYDROMORPHONE HCL 1 MG/ML IJ SOLN
1.0000 mg | Freq: Once | INTRAMUSCULAR | Status: AC
  Administered 2022-08-24: 1 mg via INTRAVENOUS
  Filled 2022-08-24: qty 1

## 2022-08-24 MED ORDER — DICYCLOMINE HCL 10 MG/ML IM SOLN
20.0000 mg | Freq: Once | INTRAMUSCULAR | Status: AC
  Administered 2022-08-24: 20 mg via INTRAMUSCULAR
  Filled 2022-08-24: qty 2

## 2022-08-24 MED ORDER — ONDANSETRON 4 MG PO TBDP
4.0000 mg | ORAL_TABLET | Freq: Once | ORAL | Status: AC | PRN
  Administered 2022-08-24: 4 mg via ORAL
  Filled 2022-08-24: qty 1

## 2022-08-24 MED ORDER — SODIUM CHLORIDE 0.9 % IV BOLUS
1000.0000 mL | Freq: Once | INTRAVENOUS | Status: AC
  Administered 2022-08-24: 1000 mL via INTRAVENOUS

## 2022-08-24 MED ORDER — KETOROLAC TROMETHAMINE 15 MG/ML IJ SOLN
15.0000 mg | Freq: Once | INTRAMUSCULAR | Status: AC
  Administered 2022-08-24: 15 mg via INTRAVENOUS
  Filled 2022-08-24: qty 1

## 2022-08-24 MED ORDER — METOCLOPRAMIDE HCL 5 MG/ML IJ SOLN
10.0000 mg | Freq: Once | INTRAMUSCULAR | Status: AC
  Administered 2022-08-24: 10 mg via INTRAVENOUS
  Filled 2022-08-24: qty 2

## 2022-08-24 MED ORDER — METHOCARBAMOL 500 MG PO TABS: 500.0000 mg | ORAL_TABLET | Freq: Two times a day (BID) | ORAL | 0 refills | Status: DC

## 2022-08-24 MED ORDER — IOHEXOL 300 MG/ML  SOLN
100.0000 mL | Freq: Once | INTRAMUSCULAR | Status: AC | PRN
  Administered 2022-08-24: 100 mL via INTRAVENOUS

## 2022-08-24 MED ORDER — SODIUM CHLORIDE 0.9 % IV SOLN
12.5000 mg | Freq: Once | INTRAVENOUS | Status: AC
  Administered 2022-08-24: 12.5 mg via INTRAVENOUS
  Filled 2022-08-24: qty 12.5

## 2022-08-24 NOTE — ED Triage Notes (Signed)
Patient reports vomiting that started yesterday, endorses abd pain as well. Hasn't been able to tolerate anything PO.

## 2022-08-24 NOTE — ED Notes (Signed)
Pt discharged home. Discharge information discussed. No s/s of distress during discharge.

## 2022-08-24 NOTE — ED Notes (Signed)
Pt vomited on the lobby floor, despite having a bag. Pt expresses disdain for the extended wait time. Pt ambulatory without the need for assistance.

## 2022-08-24 NOTE — Discharge Instructions (Addendum)
You were evaluated today for nausea and vomiting.  You requested a tizanidine (Zanaflex) prescription.  Due to your EKG changes I have prescribed Robaxin instead.  This is a different muscle relaxer  Please continue to take your home Phenergan.  Please follow-up with your primary care team for further evaluation and management

## 2022-08-24 NOTE — ED Provider Notes (Signed)
[place] EMERGENCY DEPARTMENT AT [facility] Provider Note   CSN: [id number] Arrival date & time: 08/24/22  0108     History  Chief Complaint  Patient presents with   Emesis   Abdominal Pain    [name] is a 52 y.o. female.  Patient with past medical history significant for multiple emergency department visits due to gastroparesis/nausea/vomiting, bipolar disorder, Barrett's esophagus presents today for intractable nausea and vomiting.  She states that Wednesday night she began to have nausea and vomiting and some periumbilical abdominal pain.  She has tried Phenergan at home with no relief of symptoms.  She denies chest pain, shortness of breath, fevers, urinary symptoms.  HPI     Home Medications Prior to Admission medications   Medication Sig Start Date End Date Taking? Authorizing Provider  methocarbamol (ROBAXIN) 500 MG tablet Take 1 tablet (500 mg total) by mouth 2 (two) times daily. 08/24/22  Yes Darrick Grinder, PA-C  acetaminophen (TYLENOL) 650 MG CR tablet Take 1,300 mg by mouth as needed for pain.    [provider]  ARIPiprazole (ABILIFY) 5 MG tablet Take 1 tablet (5 mg total) by mouth daily. 08/01/20   Marcello Fennel, MD  atorvastatin (LIPITOR) 10 MG tablet Take 10 mg by mouth every evening. 08/26/20   [provider]  dicyclomine (BENTYL) 20 MG tablet Take 1 tablet (20 mg total) by mouth 2 (two) times daily for 5 days. 07/27/22 08/01/22  Sloan Leiter, DO  FLUoxetine (PROZAC) 40 MG capsule Take 1 capsule (40 mg total) by mouth daily. 07/01/20   Angiulli, Mcarthur Rossetti, PA-C  ibuprofen (ADVIL) 200 MG tablet Take 400 mg by mouth 3 (three) times daily as needed for moderate pain.    [provider]  levothyroxine (SYNTHROID) 50 MCG tablet Take 50 mcg by mouth daily before breakfast.    [provider]  lidocaine (LIDODERM) 5 % Place 1 patch onto the skin daily. Remove & Discard patch within 12 hours or as directed by  MD Patient taking differently: Place 1 patch onto the skin as needed (pain). 07/01/22   Shalhoub, Deno Lunger, MD  LIDOCAINE EX Apply 1 Application topically as needed (pain). Cream    [provider]  metoCLOPramide (REGLAN) 5 MG tablet Take 5 mg by mouth 2 (two) times daily. 07/10/22   [provider]  metoprolol succinate (TOPROL-XL) 25 MG 24 hr tablet Take 1 tablet (25 mg total) by mouth daily. Follow up with your PCP regarding your heart rate and whether this should be continued. Patient taking differently: Take 25 mg by mouth every evening. 07/14/22 08/13/22  Zigmund Daniel., MD  multivitamin (RENA-VIT) TABS tablet Take 1 tablet by mouth at bedtime. 08/01/20   Marcello Fennel, MD  ondansetron (ZOFRAN) 4 MG tablet Take 1 tablet (4 mg total) by mouth every 6 (six) hours. 08/04/22   Gareth Eagle, PA-C  ondansetron (ZOFRAN-ODT) 8 MG disintegrating tablet Take 1 tablet (8 mg total) by mouth every 8 (eight) hours as needed for nausea or vomiting. Patient taking differently: Take 8 mg by mouth as needed for nausea or vomiting. 05/03/22   Tressia Danas, MD  oxyCODONE-acetaminophen (PERCOCET) 7.5-325 MG tablet Take 1 tablet by mouth 4 (four) times daily.    [provider]  pantoprazole (PROTONIX) 40 MG tablet Take 1 tablet (40 mg total) by mouth 2 (two) times daily. 05/03/22 05/03/23  Tressia Danas, MD  potassium chloride SA (KLOR-CON M) 20 MEQ  tablet Take 1 tablet (20 mEq total) by mouth daily for 3 days. 07/27/22 07/30/22  Sloan Leiter, DO  prazosin (MINIPRESS) 2 MG capsule Take 1 capsule (2 mg total) by mouth at bedtime. 08/01/20   Marcello Fennel, MD  promethazine (PHENERGAN) 25 MG suppository Place 1 suppository (25 mg total) rectally every 6 (six) hours as needed for nausea or vomiting. 07/27/22   Sloan Leiter, DO  promethazine (PHENERGAN) 25 MG tablet Take 25 mg by mouth as needed for nausea or vomiting.    [provider]  sucralfate (CARAFATE) 1 g  tablet Take 1 tablet (1 g total) by mouth with breakfast, with lunch, and with evening meal for 7 days. 07/27/22 08/03/22  Sloan Leiter, DO  tiZANidine (ZANAFLEX) 4 MG tablet Take 3 tablets (12 mg total) by mouth every 8 (eight) hours as needed. 07/20/22   Lewie Chamber, MD  traZODone (DESYREL) 150 MG tablet Take 150 mg by mouth at bedtime.    [provider]      Allergies    Patient has no known allergies.    Review of Systems   Review of Systems  Physical Exam Updated Vital Signs BP (!) 148/79   Pulse (!) 107   Temp 98.2 F (36.8 C) (Oral)   Resp 15   Ht 5\' 4"  (1.626 m)   Wt 86 kg   LMP 06/03/2018 (Approximate) Comment: neg hcg 05/10/20  SpO2 95%   BMI 32.54 kg/m  Physical Exam Vitals and nursing note reviewed.  Constitutional:      General: She is not in acute distress.    Appearance: She is obese.  HENT:     Head: Normocephalic and atraumatic.  Eyes:     Conjunctiva/sclera: Conjunctivae normal.  Cardiovascular:     Rate and Rhythm: Normal rate and regular rhythm.     Heart sounds: No murmur heard. Pulmonary:     Effort: Pulmonary effort is normal. No respiratory distress.     Breath sounds: Normal breath sounds.  Abdominal:     Palpations: Abdomen is soft.     Tenderness: There is abdominal tenderness in the right lower quadrant, periumbilical area, suprapubic area and left lower quadrant.  Musculoskeletal:        General: No swelling.     Cervical back: Neck supple.  Skin:    General: Skin is warm and dry.     Capillary Refill: Capillary refill takes less than 2 seconds.  Neurological:     Mental Status: She is alert.  Psychiatric:        Mood and Affect: Mood normal.     ED Results / Procedures / Treatments   Labs (all labs ordered are listed, but only abnormal results are displayed) Labs Reviewed  COMPREHENSIVE METABOLIC PANEL - Abnormal; Notable for the following components:      Result Value   CO2 21 (*)    Glucose, Bld 159 (*)    BUN <5  (*)    Albumin 3.2 (*)    Anion gap 17 (*)    All other components within normal limits  CBC - Abnormal; Notable for the following components:   WBC 11.5 (*)    Platelets 401 (*)    All other components within normal limits  LIPASE, BLOOD  URINALYSIS, ROUTINE W REFLEX MICROSCOPIC    EKG EKG Interpretation  Date/Time:  Friday Aug 24 2022 07:42:53 EDT Ventricular Rate:  121 PR Interval:  131 QRS Duration: 88 QT Interval:  340 QTC Calculation: 483 R Axis:   21 Text Interpretation: Sinus tachycardia Ventricular premature complex Aberrant complex Low voltage, precordial leads Confirmed by Alvester Chou [id number] on 08/24/2022 7:51:28 AM  Radiology CT ABDOMEN PELVIS W CONTRAST  Result Date: 08/24/2022 CLINICAL DATA:  Diffuse abdominal pain worsening in the lower abdomen. Vomiting EXAM: CT ABDOMEN AND PELVIS WITH CONTRAST TECHNIQUE: Multidetector CT imaging of the abdomen and pelvis was performed using the standard protocol following bolus administration of intravenous contrast. RADIATION DOSE REDUCTION: This exam was performed according to the departmental dose-optimization program which includes automated exposure control, adjustment of the mA and/or kV according to patient size and/or use of iterative reconstruction technique. CONTRAST:  OMNIPAQUE IOHEXOL 300 MG/ML  SOLN COMPARISON:  CT 08/04/2022 and older FINDINGS: Lower chest: Linear opacity seen along bases likely scar or atelectasis. No pleural effusions. Hepatobiliary: Severe fatty liver infiltration. Gallbladder is nondilated. Areas of fatty sparing along the gallbladder fossa. Patent portal vein. Pancreas: Unremarkable. No pancreatic ductal dilatation or surrounding inflammatory changes. Spleen: Normal in size without focal abnormality. Adrenals/Urinary Tract: Adrenal glands are unremarkable. Kidneys are normal, without renal calculi, focal lesion, or hydronephrosis. Bladder is unremarkable. Stomach/Bowel: Normal retrocecal  appendix. The colon is decompressed diffusely. There is some mild stranding and some vascular engorgement along the right side of the colon. A subtle colitis is not excluded. The stomach is nondilated. Small bowel is nondilated. Vascular/Lymphatic: No significant vascular findings are present. No enlarged abdominal or pelvic lymph nodes. Reproductive: Uterus and bilateral adnexa are unremarkable. Other: No abdominal wall hernia or abnormality. No abdominopelvic ascites. Musculoskeletal: Stable Schmorl's node deformities at L3 and L4. There is some linear low-density along the superior endplate of L4 as well. This could be more subacute. IMPRESSION: Severe fatty liver infiltration. No bowel obstruction, free air or free fluid. However there is some subtle stranding along the course of the ascending colon. A subtle colitis is not excluded. Normal appendix. Subacute superior endplate compression deformity of L4 Electronically Signed   By: Karen Kays M.D.   On: 08/24/2022 09:44    Procedures Procedures    Medications Ordered in ED Medications  ondansetron (ZOFRAN-ODT) disintegrating tablet 4 mg (4 mg Oral Given 08/24/22 0154)  sodium chloride 0.9 % bolus 1,000 mL (1,000 mLs Intravenous New Bag/Given 08/24/22 0735)  promethazine (PHENERGAN) 12.5 mg in sodium chloride 0.9 % 50 mL IVPB (0 mg Intravenous Stopped 08/24/22 0830)  ketorolac (TORADOL) 15 MG/ML injection 15 mg (15 mg Intravenous Given 08/24/22 0733)  HYDROmorphone (DILAUDID) injection 1 mg (1 mg Intravenous Given 08/24/22 0755)  iohexol (OMNIPAQUE) 300 MG/ML solution 100 mL (100 mLs Intravenous Contrast Given 08/24/22 0913)  metoCLOPramide (REGLAN) injection 10 mg (10 mg Intravenous Given 08/24/22 1037)  dicyclomine (BENTYL) injection 20 mg (20 mg Intramuscular Given 08/24/22 1034)    ED Course/ Medical Decision Making/ A&P                             Medical Decision Making Amount and/or Complexity of Data Reviewed Labs: ordered. Radiology:  ordered.  Risk Prescription drug management.   This patient presents to the ED for concern of abdominal pain with nausea and vomiting, this involves an extensive number of treatment options, and is a complaint that carries with it a high risk of complications and morbidity.  The differential diagnosis includes gastroparesis, cyclical vomiting, appendicitis, cholecystitis, gastritis, others   Co morbidities that complicate the patient evaluation  History of cyclical  vomiting, gastroparesis, MI with cardiac arrest in 2022   Additional history obtained:   External records from outside source obtained and reviewed including discharge summary from March 29 of this year.  Patient admitted for cyclic vomiting syndrome   Lab Tests:  I Ordered, and personally interpreted labs.  The pertinent results include: Grossly unremarkable CBC, CMP, lipase.   Imaging Studies ordered:  I ordered imaging studies including CT abdomen pelvis with contrast I independently visualized and interpreted imaging which showed  No bowel obstruction, free air or free fluid. However there is some  subtle stranding along the course of the ascending colon. A subtle  colitis is not excluded.    Normal appendix.    Subacute superior endplate compression deformity of L4   I agree with the radiologist interpretation   Cardiac Monitoring: / EKG:  The patient was maintained on a cardiac monitor.  I personally viewed and interpreted the cardiac monitored which showed an underlying rhythm of: sinus rhythm with borderline prolonged QT   Problem List / ED Course / Critical interventions / Medication management   I ordered medication including Phenergan, Toradol, normal saline bolus, Zofran, Reglan, Bentyl Reevaluation of the patient after these medicines showed that the patient improved I have reviewed the patients home medicines and have made adjustments as needed    Test / Admission - Considered:  Patient  feeling better after pain medication and antiemetics.  CT abdomen with questionable colitis but no other acute findings.  Patient feels comfortable to discharge home at this time.  No further episodes of vomiting noted.  Patient did request Zanaflex refill.  Return precautions provided.        Final Clinical Impression(s) / ED Diagnoses Final diagnoses:  Generalized abdominal pain  Nausea and vomiting, unspecified vomiting type    Rx / DC Orders ED Discharge Orders          Ordered    methocarbamol (ROBAXIN) 500 MG tablet  2 times daily        08/24/22 1112              Pamala Duffel 08/24/22 1113    Terald Sleeper, MD 08/25/22 [phone]

## 2022-09-07 ENCOUNTER — Other Ambulatory Visit: Payer: Self-pay

## 2022-09-07 ENCOUNTER — Emergency Department (HOSPITAL_COMMUNITY)
Admission: EM | Admit: 2022-09-07 | Discharge: 2022-09-07 | Disposition: A | Discharge status: Home / Self Care | Payer: Medicare Other | Attending: Emergency Medicine | Admitting: Emergency Medicine

## 2022-09-07 ENCOUNTER — Encounter (HOSPITAL_COMMUNITY): Payer: Self-pay

## 2022-09-07 DIAGNOSIS — E119 Type 2 diabetes mellitus without complications: Secondary | ICD-10-CM | POA: Insufficient documentation

## 2022-09-07 DIAGNOSIS — R1084 Generalized abdominal pain: Secondary | ICD-10-CM | POA: Diagnosis not present

## 2022-09-07 DIAGNOSIS — D649 Anemia, unspecified: Secondary | ICD-10-CM | POA: Diagnosis not present

## 2022-09-07 DIAGNOSIS — G894 Chronic pain syndrome: Secondary | ICD-10-CM | POA: Diagnosis not present

## 2022-09-07 DIAGNOSIS — R112 Nausea with vomiting, unspecified: Secondary | ICD-10-CM | POA: Diagnosis present

## 2022-09-07 DIAGNOSIS — E669 Obesity, unspecified: Secondary | ICD-10-CM | POA: Insufficient documentation

## 2022-09-07 DIAGNOSIS — Z6832 Body mass index (BMI) 32.0-32.9, adult: Secondary | ICD-10-CM | POA: Diagnosis not present

## 2022-09-07 DIAGNOSIS — E039 Hypothyroidism, unspecified: Secondary | ICD-10-CM | POA: Insufficient documentation

## 2022-09-07 DIAGNOSIS — D72829 Elevated white blood cell count, unspecified: Secondary | ICD-10-CM | POA: Insufficient documentation

## 2022-09-07 DIAGNOSIS — R10817 Generalized abdominal tenderness: Secondary | ICD-10-CM | POA: Diagnosis not present

## 2022-09-07 LAB — COMPREHENSIVE METABOLIC PANEL
ALT: 24 U/L (ref 0–44)
AST: 30 U/L (ref 15–41)
Albumin: 3 g/dL — ABNORMAL LOW (ref 3.5–5.0)
Alkaline Phosphatase: 82 U/L (ref 38–126)
Anion gap: 13 (ref 5–15)
BUN: <5 mg/dL — ABNORMAL LOW (ref 6–20)
CO2: 24 mmol/L (ref 22–32)
Calcium: 8.8 mg/dL — ABNORMAL LOW (ref 8.9–10.3)
Chloride: 104 mmol/L (ref 98–111)
Creatinine, Ser: 0.76 mg/dL (ref 0.44–1.00)
GFR, Estimated: >60 mL/min (ref >60)
Glucose, Bld: 108 mg/dL — ABNORMAL HIGH (ref 70–99)
Potassium: 3.5 mmol/L (ref 3.5–5.1)
Sodium: 141 mmol/L (ref 135–145)
Total Bilirubin: 0.8 mg/dL (ref 0.3–1.2)
Total Protein: 6.8 g/dL (ref 6.5–8.1)

## 2022-09-07 LAB — CBC WITH DIFFERENTIAL/PLATELET
Abs Immature Granulocytes: 0.13 10*3/uL — ABNORMAL HIGH (ref 0.00–0.07)
Basophils Absolute: 0.1 10*3/uL (ref 0.0–0.1)
Basophils Relative: 1 %
Eosinophils Absolute: 0 10*3/uL (ref 0.0–0.5)
Eosinophils Relative: 0 %
HCT: 37.3 % (ref 36.0–46.0)
Hemoglobin: 11.8 g/dL — ABNORMAL LOW (ref 12.0–15.0)
Immature Granulocytes: 1 %
Lymphocytes Relative: 9 %
Lymphs Abs: 1.2 10*3/uL (ref 0.7–4.0)
MCH: 30.6 pg (ref 26.0–34.0)
MCHC: 31.6 g/dL (ref 30.0–36.0)
MCV: 96.9 fL (ref 80.0–100.0)
Monocytes Absolute: 0.8 10*3/uL (ref 0.1–1.0)
Monocytes Relative: 6 %
Neutro Abs: 11 10*3/uL — ABNORMAL HIGH (ref 1.7–7.7)
Neutrophils Relative %: 83 %
Platelets: 366 10*3/uL (ref 150–400)
RBC: 3.85 MIL/uL — ABNORMAL LOW (ref 3.87–5.11)
RDW: 15.2 % (ref 11.5–15.5)
WBC: 13.2 10*3/uL — ABNORMAL HIGH (ref 4.0–10.5)
nRBC: 0 % (ref 0.0–0.2)

## 2022-09-07 LAB — LIPASE, BLOOD: Lipase: 21 U/L (ref 11–51)

## 2022-09-07 LAB — I-STAT BETA HCG BLOOD, ED (MC, WL, AP ONLY): I-stat hCG, quantitative: <5 m[IU]/mL (ref <5)

## 2022-09-07 LAB — MAGNESIUM: Magnesium: 1.6 mg/dL — ABNORMAL LOW (ref 1.7–2.4)

## 2022-09-07 LAB — CBG MONITORING, ED: Glucose-Capillary: 105 mg/dL — ABNORMAL HIGH (ref 70–99)

## 2022-09-07 MED ORDER — LORAZEPAM 2 MG/ML IJ SOLN
0.5000 mg | Freq: Once | INTRAMUSCULAR | Status: AC
  Administered 2022-09-07: 0.5 mg via INTRAVENOUS
  Filled 2022-09-07: qty 1

## 2022-09-07 MED ORDER — METOCLOPRAMIDE HCL 5 MG/ML IJ SOLN
10.0000 mg | Freq: Once | INTRAMUSCULAR | Status: AC
  Administered 2022-09-07: 10 mg via INTRAVENOUS
  Filled 2022-09-07: qty 2

## 2022-09-07 MED ORDER — MORPHINE SULFATE (PF) 4 MG/ML IV SOLN
4.0000 mg | Freq: Once | INTRAVENOUS | Status: AC
  Administered 2022-09-07: 4 mg via INTRAVENOUS
  Filled 2022-09-07: qty 1

## 2022-09-07 MED ORDER — ONDANSETRON HCL 4 MG/2ML IJ SOLN
4.0000 mg | Freq: Once | INTRAMUSCULAR | Status: AC
  Administered 2022-09-07: 4 mg via INTRAVENOUS
  Filled 2022-09-07: qty 2

## 2022-09-07 MED ORDER — PROMETHAZINE HCL 25 MG RE SUPP: 25.0000 mg | Freq: Three times a day (TID) | RECTAL | 0 refills | Status: DC | PRN

## 2022-09-07 MED ORDER — LACTATED RINGERS IV BOLUS
1000.0000 mL | Freq: Once | INTRAVENOUS | Status: AC
  Administered 2022-09-07: 1000 mL via INTRAVENOUS

## 2022-09-07 MED ORDER — MAGNESIUM OXIDE -MG SUPPLEMENT 400 (240 MG) MG PO TABS
400.0000 mg | ORAL_TABLET | Freq: Once | ORAL | Status: AC
  Administered 2022-09-07: 400 mg via ORAL
  Filled 2022-09-07: qty 1

## 2022-09-07 MED ORDER — KETOROLAC TROMETHAMINE 15 MG/ML IJ SOLN
15.0000 mg | Freq: Once | INTRAMUSCULAR | Status: AC
  Administered 2022-09-07: 15 mg via INTRAVENOUS
  Filled 2022-09-07: qty 1

## 2022-09-07 MED ORDER — DIPHENHYDRAMINE HCL 50 MG/ML IJ SOLN
25.0000 mg | Freq: Once | INTRAMUSCULAR | Status: AC
  Administered 2022-09-07: 25 mg via INTRAVENOUS
  Filled 2022-09-07: qty 1

## 2022-09-07 MED ORDER — ONDANSETRON HCL 4 MG/2ML IJ SOLN: 4.0000 mg | Freq: Once | INTRAMUSCULAR | Status: DC

## 2022-09-07 NOTE — ED Triage Notes (Signed)
Patient has been vomiting since this morning. Generalized abdominal pain. No diarrhea.

## 2022-09-07 NOTE — ED Notes (Signed)
Patient given water

## 2022-09-07 NOTE — Discharge Instructions (Addendum)
The workup today was overall reassuring.  As discussed, recommend taking nausea medicine as prescribed.  Recommend bland diet over the next 2 days until you are able to tolerate more complex foods.  Recommend follow-up with primary care for reassessment of your symptoms.  Please not hesitate to return to emergency department for worrisome signs and symptoms we discussed become apparent.

## 2022-09-07 NOTE — ED Provider Notes (Signed)
EMERGENCY DEPARTMENT AT [facility] Provider Note   CSN: [id number] Arrival date & time: 09/07/22  1644     History  Chief Complaint  Patient presents with   Emesis    [name] is a 52 y.o. female.   Emesis   52 year old female presents emergency department with complaints of nausea, vomiting.  Patient states that symptoms began this morning.  Says she has a history of similar symptoms in the past and this feels very similar.  Reports generalized abdominal tenderness with most localization in epigastric region.  States she tried at home ODT Zofran with no success in controlling vomiting prompting visit to the emergency department.  Denies fever, chills, chest pain, shortness of breath, urinary symptoms, change in bowel habits.  Denies hematemesis.  Denies any alcohol, marijuana use or other substance use.  Past medical history significant for cyclical vomiting syndrome, diabetes mellitus, drug-seeking behavior, Barrett's esophagus, hypothyroidism, diabetes mellitus type 2, obesity, chronic pain syndrome,  Home Medications Prior to Admission medications   Medication Sig Start Date End Date Taking? Authorizing Provider  promethazine (PHENERGAN) 25 MG suppository Place 1 suppository (25 mg total) rectally every 8 (eight) hours as needed for nausea, vomiting or refractory nausea / vomiting. 09/07/22  Yes Sherian Maroon A, PA  acetaminophen (TYLENOL) 650 MG CR tablet Take 1,300 mg by mouth as needed for pain.    [provider]  ARIPiprazole (ABILIFY) 5 MG tablet Take 1 tablet (5 mg total) by mouth daily. 08/01/20   Marcello Fennel, MD  atorvastatin (LIPITOR) 10 MG tablet Take 10 mg by mouth every evening. 08/26/20   [provider]  dicyclomine (BENTYL) 20 MG tablet Take 1 tablet (20 mg total) by mouth 2 (two) times daily for 5 days. 07/27/22 08/01/22  Sloan Leiter, DO  FLUoxetine (PROZAC) 40 MG capsule Take 1 capsule (40 mg total) by mouth  daily. 07/01/20   Angiulli, Mcarthur Rossetti, PA-C  ibuprofen (ADVIL) 200 MG tablet Take 400 mg by mouth 3 (three) times daily as needed for moderate pain.    [provider]  levothyroxine (SYNTHROID) 50 MCG tablet Take 50 mcg by mouth daily before breakfast.    [provider]  lidocaine (LIDODERM) 5 % Place 1 patch onto the skin daily. Remove & Discard patch within 12 hours or as directed by MD Patient taking differently: Place 1 patch onto the skin as needed (pain). 07/01/22   Shalhoub, Deno Lunger, MD  LIDOCAINE EX Apply 1 Application topically as needed (pain). Cream    [provider]  methocarbamol (ROBAXIN) 500 MG tablet Take 1 tablet (500 mg total) by mouth 2 (two) times daily. 08/24/22   Darrick Grinder, PA-C  metoCLOPramide (REGLAN) 5 MG tablet Take 5 mg by mouth 2 (two) times daily. 07/10/22   [provider]  metoprolol succinate (TOPROL-XL) 25 MG 24 hr tablet Take 1 tablet (25 mg total) by mouth daily. Follow up with your PCP regarding your heart rate and whether this should be continued. Patient taking differently: Take 25 mg by mouth every evening. 07/14/22 08/13/22  Zigmund Daniel., MD  multivitamin (RENA-VIT) TABS tablet Take 1 tablet by mouth at bedtime. 08/01/20   Marcello Fennel, MD  ondansetron (ZOFRAN) 4 MG tablet Take 1 tablet (4 mg total) by mouth every 6 (six) hours. 08/04/22   Gareth Eagle, PA-C  ondansetron (ZOFRAN-ODT) 8 MG disintegrating tablet Take 1 tablet (8 mg total) by mouth every 8 (  eight) hours as needed for nausea or vomiting. Patient taking differently: Take 8 mg by mouth as needed for nausea or vomiting. 05/03/22   Tressia Danas, MD  oxyCODONE-acetaminophen (PERCOCET) 7.5-325 MG tablet Take 1 tablet by mouth 4 (four) times daily.    [provider]  pantoprazole (PROTONIX) 40 MG tablet Take 1 tablet (40 mg total) by mouth 2 (two) times daily. 05/03/22 05/03/23  Tressia Danas, MD  potassium chloride SA (KLOR-CON M)  20 MEQ tablet Take 1 tablet (20 mEq total) by mouth daily for 3 days. 07/27/22 07/30/22  Sloan Leiter, DO  prazosin (MINIPRESS) 2 MG capsule Take 1 capsule (2 mg total) by mouth at bedtime. 08/01/20   Marcello Fennel, MD  sucralfate (CARAFATE) 1 g tablet Take 1 tablet (1 g total) by mouth with breakfast, with lunch, and with evening meal for 7 days. 07/27/22 08/03/22  Sloan Leiter, DO  tiZANidine (ZANAFLEX) 4 MG tablet Take 3 tablets (12 mg total) by mouth every 8 (eight) hours as needed. 07/20/22   Lewie Chamber, MD  traZODone (DESYREL) 150 MG tablet Take 150 mg by mouth at bedtime.    [provider]      Allergies    Patient has no known allergies.    Review of Systems   Review of Systems  Gastrointestinal:  Positive for vomiting.  All other systems reviewed and are negative.   Physical Exam Updated Vital Signs BP (!) 188/112   Pulse 85   Temp 99.3 F (37.4 C) (Oral)   Resp 18   Ht 5' 4.5" (1.638 m)   Wt 86 kg   LMP 06/03/2018 (Approximate) Comment: neg hcg 05/10/20  SpO2 95%   BMI 32.04 kg/m  Physical Exam Vitals and nursing note reviewed.  Constitutional:      General: She is not in acute distress.    Appearance: She is well-developed.  HENT:     Head: Normocephalic and atraumatic.  Eyes:     Conjunctiva/sclera: Conjunctivae normal.  Cardiovascular:     Rate and Rhythm: Normal rate and regular rhythm.     Heart sounds: No murmur heard. Pulmonary:     Effort: Pulmonary effort is normal. No respiratory distress.     Breath sounds: Normal breath sounds.  Abdominal:     Palpations: Abdomen is soft.     Tenderness: There is no right CVA tenderness, left CVA tenderness or guarding.     Comments: General abdominal tenderness  Musculoskeletal:        General: No swelling.     Cervical back: Neck supple.     Right lower leg: No edema.     Left lower leg: No edema.  Skin:    General: Skin is warm and dry.     Capillary Refill: Capillary refill takes less than 2  seconds.  Neurological:     Mental Status: She is alert.  Psychiatric:        Mood and Affect: Mood normal.     ED Results / Procedures / Treatments   Labs (all labs ordered are listed, but only abnormal results are displayed) Labs Reviewed  COMPREHENSIVE METABOLIC PANEL - Abnormal; Notable for the following components:      Result Value   Glucose, Bld 108 (*)    BUN <5 (*)    Calcium 8.8 (*)    Albumin 3.0 (*)    All other components within normal limits  CBC WITH DIFFERENTIAL/PLATELET - Abnormal; Notable for the following components:  WBC 13.2 (*)    RBC 3.85 (*)    Hemoglobin 11.8 (*)    Neutro Abs 11.0 (*)    Abs Immature Granulocytes 0.13 (*)    All other components within normal limits  MAGNESIUM - Abnormal; Notable for the following components:   Magnesium 1.6 (*)    All other components within normal limits  CBG MONITORING, ED - Abnormal; Notable for the following components:   Glucose-Capillary 105 (*)    All other components within normal limits  LIPASE, BLOOD  URINALYSIS, ROUTINE W REFLEX MICROSCOPIC  RAPID URINE DRUG SCREEN, HOSP PERFORMED  I-STAT BETA HCG BLOOD, ED ([facility], [facility], [facility] ONLY)    EKG None  Radiology No results found.  Procedures Procedures    Medications Ordered in ED Medications  lactated ringers bolus 1,000 mL (0 mLs Intravenous Stopped 09/07/22 2111)  ketorolac (TORADOL) 15 MG/ML injection 15 mg (15 mg Intravenous Given 09/07/22 1840)  metoCLOPramide (REGLAN) injection 10 mg (10 mg Intravenous Given 09/07/22 1838)  diphenhydrAMINE (BENADRYL) injection 25 mg (25 mg Intravenous Given 09/07/22 1837)  LORazepam (ATIVAN) injection 0.5 mg (0.5 mg Intravenous Given 09/07/22 1841)  magnesium oxide (MAG-OX) tablet 400 mg (400 mg Oral Given 09/07/22 2010)  ondansetron (ZOFRAN) injection 4 mg (4 mg Intravenous Given 09/07/22 2009)  morphine (PF) 4 MG/ML injection 4 mg (4 mg Intravenous Given 09/07/22 2010)    ED Course/ Medical Decision Making/ A&P                              Medical Decision Making Amount and/or Complexity of Data Reviewed Labs: ordered.  Risk OTC drugs. Prescription drug management.   This patient presents to the ED for concern of nausea, vomiting, abdominal pain, this involves an extensive number of treatment options, and is a complaint that carries with it a high risk of complications and morbidity.  The differential diagnosis includes gastroparesis, gastritis, pancreatitis, CBD pathology, cholecystitis, hepatitis, SBO/LBO, volvulus, diverticulitis, appendicitis, pyelonephritis, nephrolithiasis, cystitis, ectopic pregnancy, ovarian torsion, PID, mesenteric ischemia, aortic aneurysm   Co morbidities that complicate the patient evaluation  See HPI   Additional history obtained:  Additional history obtained from EMR External records from outside source obtained and reviewed including hospital records   Lab Tests:  I Ordered, and personally interpreted labs.  The pertinent results include: Leukocytosis of 13.2.  Evidence of anemia with a hemoglobin 11.8 of which is near patient's baseline.  Platelets within normal range.  Mild hypocalcemia of 8.8 but otherwise, electrolytes within normal limits.  No transaminitis.  No renal dysfunction.  Magnesium low of 1.6 of which supplemented orally.  Patient's CBG within normal limits.  Beta-hCG negative.  Lipase within normal limits.   Imaging Studies ordered:  N/a   Cardiac Monitoring: / EKG:  The patient was maintained on a cardiac monitor.  I personally viewed and interpreted the cardiac monitored which showed an underlying rhythm of: Sinus rhythm   Consultations Obtained:  N/a   Problem List / ED Course / Critical interventions / Medication management  Generalized abdominal pain, nausea, vomiting I ordered medication including Toradol, Benadryl, Reglan, Ativan, Zofran, morphine, magnesium oxide, 1 L of lactated Ringer's   Reevaluation of the patient after  these medicines showed that the patient improved I have reviewed the patients home medicines and have made adjustments as needed   Social Determinants of Health:  Polysubstance use.  Drug-seeking behavior.   Test / Admission - Considered:  Generalized  abdominal pain, nausea, vomiting Vitals signs significant for hypertension with blood pressure 176 systolic. Otherwise within normal range and stable throughout visit. Laboratory studies significant for: See above 52 year old female presents emergency department with generalized abdominal pain, nausea, vomiting.  Patient has been to the emergency department with similar symptoms in the past.  Workup today overall reassuring.  Patient noted significant improvement of symptoms with administration of medicines while emergency department.  Repeat abdominal exam is entirely benign.  Patient able to tolerate p.o. without repeat bouts of emesis.  No observable vomiting since moved into room for now 3+ hours.  Recommend addition of rectal Phenergan to use when vomiting becomes refractory to oral Zofran.  Will recommend follow-up with gastroenterology outpatient for reassessment of symptoms given patient's recurrent visits to the emergency department for similar sort of presentation.  Shared decision made conversation was had with patient regarding CT imaging of the abdomen/pelvis the patient reported very similar feelings of current presentation from prior episodes so imaging was foregone in favor of serial abdominal exams.  Treatment plan discussed at length with patient and she acknowledged understanding was agreeable to said plan.  Patient overall well-appearing, afebrile in no acute distress. Worrisome signs and symptoms were discussed with the patient, and the patient acknowledged understanding to return to the ED if noticed. Patient was stable upon discharge.          Final Clinical Impression(s) / ED Diagnoses Final diagnoses:  Nausea and  vomiting, unspecified vomiting type  Generalized abdominal pain    Rx / DC Orders ED Discharge Orders          Ordered    promethazine (PHENERGAN) 25 MG suppository  Every 8 hours PRN        09/07/22 2102              Peter Garter, [place] 09/07/22 2159    Gwyneth Sprout, MD 09/13/22 [phone]

## 2022-09-20 ENCOUNTER — Other Ambulatory Visit: Payer: Self-pay | Admitting: Gastroenterology

## 2022-09-25 ENCOUNTER — Emergency Department (HOSPITAL_COMMUNITY)
Admission: EM | Admit: 2022-09-25 | Discharge: 2022-09-25 | Disposition: A | Discharge status: Home / Self Care | Payer: Medicare Other | Attending: Emergency Medicine | Admitting: Emergency Medicine

## 2022-09-25 ENCOUNTER — Emergency Department (HOSPITAL_COMMUNITY): Payer: Medicare Other

## 2022-09-25 ENCOUNTER — Encounter (HOSPITAL_COMMUNITY): Payer: Self-pay

## 2022-09-25 ENCOUNTER — Other Ambulatory Visit: Payer: Self-pay

## 2022-09-25 DIAGNOSIS — R112 Nausea with vomiting, unspecified: Secondary | ICD-10-CM | POA: Diagnosis present

## 2022-09-25 DIAGNOSIS — K3184 Gastroparesis: Secondary | ICD-10-CM | POA: Diagnosis not present

## 2022-09-25 DIAGNOSIS — D72829 Elevated white blood cell count, unspecified: Secondary | ICD-10-CM | POA: Insufficient documentation

## 2022-09-25 LAB — CBC WITH DIFFERENTIAL/PLATELET
Abs Immature Granulocytes: 0.08 10*3/uL — ABNORMAL HIGH (ref 0.00–0.07)
Basophils Absolute: 0.1 10*3/uL (ref 0.0–0.1)
Basophils Relative: 1 %
Eosinophils Absolute: 0 10*3/uL (ref 0.0–0.5)
Eosinophils Relative: 0 %
HCT: 39 % (ref 36.0–46.0)
Hemoglobin: 12.5 g/dL (ref 12.0–15.0)
Immature Granulocytes: 0 %
Lymphocytes Relative: 6 %
Lymphs Abs: 1.1 10*3/uL (ref 0.7–4.0)
MCH: 30.4 pg (ref 26.0–34.0)
MCHC: 32.1 g/dL (ref 30.0–36.0)
MCV: 94.9 fL (ref 80.0–100.0)
Monocytes Absolute: 1.6 10*3/uL — ABNORMAL HIGH (ref 0.1–1.0)
Monocytes Relative: 9 %
Neutro Abs: 15.2 10*3/uL — ABNORMAL HIGH (ref 1.7–7.7)
Neutrophils Relative %: 84 %
Platelets: 472 10*3/uL — ABNORMAL HIGH (ref 150–400)
RBC: 4.11 MIL/uL (ref 3.87–5.11)
RDW: 14.6 % (ref 11.5–15.5)
WBC: 18.1 10*3/uL — ABNORMAL HIGH (ref 4.0–10.5)
nRBC: 0 % (ref 0.0–0.2)

## 2022-09-25 LAB — COMPREHENSIVE METABOLIC PANEL
ALT: 22 U/L (ref 0–44)
AST: 48 U/L — ABNORMAL HIGH (ref 15–41)
Albumin: 3.2 g/dL — ABNORMAL LOW (ref 3.5–5.0)
Alkaline Phosphatase: 94 U/L (ref 38–126)
Anion gap: 14 (ref 5–15)
BUN: <5 mg/dL — ABNORMAL LOW (ref 6–20)
CO2: 24 mmol/L (ref 22–32)
Calcium: 8.7 mg/dL — ABNORMAL LOW (ref 8.9–10.3)
Chloride: 102 mmol/L (ref 98–111)
Creatinine, Ser: 0.6 mg/dL (ref 0.44–1.00)
GFR, Estimated: >60 mL/min (ref >60)
Glucose, Bld: 129 mg/dL — ABNORMAL HIGH (ref 70–99)
Potassium: 4.3 mmol/L (ref 3.5–5.1)
Sodium: 140 mmol/L (ref 135–145)
Total Bilirubin: 1.3 mg/dL — ABNORMAL HIGH (ref 0.3–1.2)
Total Protein: 7.6 g/dL (ref 6.5–8.1)

## 2022-09-25 LAB — LIPASE, BLOOD: Lipase: 19 U/L (ref 11–51)

## 2022-09-25 MED ORDER — METOCLOPRAMIDE HCL 5 MG/ML IJ SOLN
10.0000 mg | Freq: Once | INTRAMUSCULAR | Status: AC
  Administered 2022-09-25: 10 mg via INTRAVENOUS
  Filled 2022-09-25: qty 2

## 2022-09-25 MED ORDER — PROMETHAZINE HCL 25 MG PO TABS: 25.0000 mg | ORAL_TABLET | Freq: Three times a day (TID) | ORAL | 0 refills | Status: DC | PRN

## 2022-09-25 MED ORDER — LACTATED RINGERS IV BOLUS
1000.0000 mL | Freq: Once | INTRAVENOUS | Status: AC
  Administered 2022-09-25: 1000 mL via INTRAVENOUS

## 2022-09-25 MED ORDER — HYDROMORPHONE HCL 1 MG/ML IJ SOLN
1.0000 mg | Freq: Once | INTRAMUSCULAR | Status: AC
  Administered 2022-09-25: 1 mg via INTRAVENOUS
  Filled 2022-09-25: qty 1

## 2022-09-25 MED ORDER — DIPHENHYDRAMINE HCL 50 MG/ML IJ SOLN
25.0000 mg | Freq: Once | INTRAMUSCULAR | Status: AC
  Administered 2022-09-25: 25 mg via INTRAVENOUS
  Filled 2022-09-25: qty 1

## 2022-09-25 NOTE — ED Triage Notes (Signed)
Patient arrived POV. Patient reports N/V/D that started since yesterday. Denies blood in stool or any urinary changes.

## 2022-09-25 NOTE — ED Provider Notes (Signed)
[place] EMERGENCY DEPARTMENT AT [facility] Provider Note   CSN: [id number] Arrival date & time: 09/25/22  1525     History  Chief Complaint  Patient presents with   Nausea   Vomiting    Started yesterday   Spasms    [name] is a 52 y.o. female.  HPI Patient has history of gastroparesis\chronic abdominal pain\cyclic vomiting.  Patient ports recurrence of symptoms starting 2 days ago.  She reports since 2 days ago she has had multiple episodes of vomiting.  She reports she is also diarrhea.  She endorses generalized abdominal pain aching in quality.    Home Medications Prior to Admission medications   Medication Sig Start Date End Date Taking? Authorizing Provider  promethazine (PHENERGAN) 25 MG tablet Take 1 tablet (25 mg total) by mouth every 8 (eight) hours as needed for nausea or vomiting. 09/25/22  Yes Arby Barrette, MD  acetaminophen (TYLENOL) 650 MG CR tablet Take 1,300 mg by mouth as needed for pain.    [provider]  ARIPiprazole (ABILIFY) 5 MG tablet Take 1 tablet (5 mg total) by mouth daily. 08/01/20   Marcello Fennel, MD  atorvastatin (LIPITOR) 10 MG tablet Take 10 mg by mouth every evening. 08/26/20   [provider]  dicyclomine (BENTYL) 20 MG tablet Take 1 tablet (20 mg total) by mouth 2 (two) times daily for 5 days. 07/27/22 08/01/22  Sloan Leiter, DO  FLUoxetine (PROZAC) 40 MG capsule Take 1 capsule (40 mg total) by mouth daily. 07/01/20   Angiulli, Mcarthur Rossetti, PA-C  ibuprofen (ADVIL) 200 MG tablet Take 400 mg by mouth 3 (three) times daily as needed for moderate pain.    [provider]  levothyroxine (SYNTHROID) 50 MCG tablet Take 50 mcg by mouth daily before breakfast.    [provider]  lidocaine (LIDODERM) 5 % Place 1 patch onto the skin daily. Remove & Discard patch within 12 hours or as directed by MD Patient taking differently: Place 1 patch onto the skin as needed (pain). 07/01/22   Shalhoub, Deno Lunger,  MD  LIDOCAINE EX Apply 1 Application topically as needed (pain). Cream    [provider]  methocarbamol (ROBAXIN) 500 MG tablet Take 1 tablet (500 mg total) by mouth 2 (two) times daily. 08/24/22   Darrick Grinder, PA-C  metoCLOPramide (REGLAN) 5 MG tablet Take 5 mg by mouth 2 (two) times daily. 07/10/22   [provider]  metoprolol succinate (TOPROL-XL) 25 MG 24 hr tablet Take 1 tablet (25 mg total) by mouth daily. Follow up with your PCP regarding your heart rate and whether this should be continued. Patient taking differently: Take 25 mg by mouth every evening. 07/14/22 08/13/22  Zigmund Daniel., MD  multivitamin (RENA-VIT) TABS tablet Take 1 tablet by mouth at bedtime. 08/01/20   Marcello Fennel, MD  ondansetron (ZOFRAN) 4 MG tablet Take 1 tablet (4 mg total) by mouth every 6 (six) hours. 08/04/22   Gareth Eagle, PA-C  ondansetron (ZOFRAN-ODT) 8 MG disintegrating tablet Take 1 tablet (8 mg total) by mouth every 8 (eight) hours as needed for nausea or vomiting. Patient taking differently: Take 8 mg by mouth as needed for nausea or vomiting. 05/03/22   Tressia Danas, MD  oxyCODONE-acetaminophen (PERCOCET) 7.5-325 MG tablet Take 1 tablet by mouth 4 (four) times daily.    [provider]  pantoprazole (PROTONIX) 40 MG tablet Take 1 tablet by mouth twice daily 09/21/22  Meredith Pel, NP  potassium chloride SA (KLOR-CON M) 20 MEQ tablet Take 1 tablet (20 mEq total) by mouth daily for 3 days. 07/27/22 07/30/22  Sloan Leiter, DO  prazosin (MINIPRESS) 2 MG capsule Take 1 capsule (2 mg total) by mouth at bedtime. 08/01/20   Marcello Fennel, MD  promethazine (PHENERGAN) 25 MG suppository Place 1 suppository (25 mg total) rectally every 8 (eight) hours as needed for nausea, vomiting or refractory nausea / vomiting. 09/07/22   Peter Garter, PA  sucralfate (CARAFATE) 1 g tablet Take 1 tablet (1 g total) by mouth with breakfast, with lunch, and with evening meal  for 7 days. 07/27/22 08/03/22  Sloan Leiter, DO  tiZANidine (ZANAFLEX) 4 MG tablet Take 3 tablets (12 mg total) by mouth every 8 (eight) hours as needed. 07/20/22   Lewie Chamber, MD  traZODone (DESYREL) 150 MG tablet Take 150 mg by mouth at bedtime.    [provider]      Allergies    Patient has no known allergies.    Review of Systems   Review of Systems  Physical Exam Updated Vital Signs BP (!) 174/91 (BP Location: Left Arm)   Pulse 90   Temp 98.5 F (36.9 C) (Oral)   Resp 18   Ht 5\' 4"  (1.626 m)   Wt 86 kg   LMP 06/03/2018 (Approximate) Comment: neg hcg 05/10/20  SpO2 97%   BMI 32.54 kg/m  Physical Exam Constitutional:      Comments: Patient is alert.  She is sitting up in the chair.  She does have an emesis bag with some clear yellow fluid in it.  Nontoxic.  No respiratory distress.  HENT:     Mouth/Throat:     Mouth: Mucous membranes are dry.  Eyes:     Extraocular Movements: Extraocular movements intact.  Cardiovascular:     Comments: Borderline tachycardia.  No gross rub or gallop. Pulmonary:     Effort: Pulmonary effort is normal.     Breath sounds: Normal breath sounds.  Abdominal:     Comments: Patient endorses discomfort diffusely to palpation the abdomen.  Abdomen is soft without guarding.  Musculoskeletal:        General: No swelling. Normal range of motion.     Right lower leg: No edema.     Left lower leg: No edema.  Skin:    General: Skin is warm and dry.  Neurological:     General: No focal deficit present.     Mental Status: She is oriented to person, place, and time.     Coordination: Coordination normal.     ED Results / Procedures / Treatments   Labs (all labs ordered are listed, but only abnormal results are displayed) Labs Reviewed  COMPREHENSIVE METABOLIC PANEL - Abnormal; Notable for the following components:      Result Value   Glucose, Bld 129 (*)    BUN <5 (*)    Calcium 8.7 (*)    Albumin 3.2 (*)    AST 48 (*)     Total Bilirubin 1.3 (*)    All other components within normal limits  CBC WITH DIFFERENTIAL/PLATELET - Abnormal; Notable for the following components:   WBC 18.1 (*)    Platelets 472 (*)    Neutro Abs 15.2 (*)    Monocytes Absolute 1.6 (*)    Abs Immature Granulocytes 0.08 (*)    All other components within normal limits  LIPASE, BLOOD  EKG None  Radiology CT ABDOMEN PELVIS WO CONTRAST  Result Date: 09/25/2022 CLINICAL DATA:  Abdominal pain EXAM: CT ABDOMEN AND PELVIS WITHOUT CONTRAST TECHNIQUE: Multidetector CT imaging of the abdomen and pelvis was performed following the standard protocol without IV contrast. RADIATION DOSE REDUCTION: This exam was performed according to the departmental dose-optimization program which includes automated exposure control, adjustment of the mA and/or kV according to patient size and/or use of iterative reconstruction technique. COMPARISON:  CT abdomen and pelvis 08/24/2022 FINDINGS: Lower chest: No acute abnormality. Hepatobiliary: Again seen is marked fatty infiltration of the liver. No focal liver lesions are seen. Gallbladder and bile ducts are within normal limits. Pancreas: Diffuse atrophy. No pancreatic ductal dilatation or surrounding inflammatory changes. Spleen: Normal in size without focal abnormality. Adrenals/Urinary Tract: Adrenal glands are unremarkable. Kidneys are normal, without renal calculi, focal lesion, or hydronephrosis. Bladder is unremarkable. Stomach/Bowel: Stomach is within normal limits. Appendix appears normal. No evidence of bowel wall thickening, distention, or inflammatory changes. There is sigmoid colon diverticulosis. Vascular/Lymphatic: Aortic atherosclerosis. No enlarged abdominal or pelvic lymph nodes. Reproductive: Uterus and bilateral adnexa are unremarkable. Other: No abdominal wall hernia or abnormality. No abdominopelvic ascites. Musculoskeletal: L4 compression deformity is chronic and unchanged. IMPRESSION: 1. No acute  localizing process in the abdomen or pelvis. 2. Fatty infiltration of the liver. 3. Sigmoid colon diverticulosis. Aortic Atherosclerosis (ICD10-I70.0). Electronically Signed   By: Darliss Cheney M.D.   On: 09/25/2022 20:45    Procedures Procedures    Medications Ordered in ED Medications  lactated ringers bolus 1,000 mL (1,000 mLs Intravenous New Bag/Given 09/25/22 1823)  metoCLOPramide (REGLAN) injection 10 mg (10 mg Intravenous Given 09/25/22 1825)  diphenhydrAMINE (BENADRYL) injection 25 mg (25 mg Intravenous Given 09/25/22 1824)  HYDROmorphone (DILAUDID) injection 1 mg (1 mg Intravenous Given 09/25/22 1823)  HYDROmorphone (DILAUDID) injection 1 mg (1 mg Intravenous Given 09/25/22 2032)    ED Course/ Medical Decision Making/ A&P                             Medical Decision Making Amount and/or Complexity of Data Reviewed Labs: ordered. Radiology: ordered.  Risk Prescription drug management.   Patient presents as outlined.  She has history of similar presentation with multiple prior episodes of intractable vomiting and abdominal pain.  Intermittently patient has required admission.  Differential diagnosis includes bowel obstruction\diverticulitis\pancreatitis\chronic abdominal pain.  Proceed with basic lab work to rule out metabolic derangement.  Will initiate fluid resuscitation and pain and nausea control.  Patient has leukocytosis 18,000.  Normal H&H.  Lipase 19.  Normal GFR.  Total bilirubin 1.3.  AST 48 ALT 22.  CT scan interpreted by radiology negative for acute findings.  Recheck 10: 15 patient is improved.  She has not had further vomiting.  Comfortable in appearance.  At this time with negative CT scan and symptoms controlled stable for discharge.  Will provide Phenergan for home use if needed.        Final Clinical Impression(s) / ED Diagnoses Final diagnoses:  Gastroparesis    Rx / DC Orders ED Discharge Orders          Ordered    promethazine (PHENERGAN) 25 MG  tablet  Every 8 hours PRN        09/25/22 2226              Arby Barrette, MD 09/25/22 2229

## 2022-09-25 NOTE — Discharge Instructions (Signed)
1.  Stay hydrated drinking small amounts of fluids frequently. 2.  If needed use Phenergan sparingly for control of nausea. 3.  Follow-up with your doctor for recheck as soon as possible.

## 2022-09-25 NOTE — ED Notes (Addendum)
Patient ripped off blood pressure cuff stating it was hurting her arm. Did not allow this nurse to try again. Patients family was called and will pick her up.

## 2022-11-24 ENCOUNTER — Other Ambulatory Visit: Payer: Self-pay

## 2022-11-24 ENCOUNTER — Emergency Department (HOSPITAL_COMMUNITY)
Admission: EM | Admit: 2022-11-24 | Discharge: 2022-11-24 | Disposition: A | Discharge status: Home / Self Care | Payer: Medicare Other | Source: Home / Self Care | Attending: Emergency Medicine | Admitting: Emergency Medicine

## 2022-11-24 ENCOUNTER — Emergency Department (HOSPITAL_COMMUNITY): Payer: Medicare Other

## 2022-11-24 DIAGNOSIS — I2510 Atherosclerotic heart disease of native coronary artery without angina pectoris: Secondary | ICD-10-CM | POA: Insufficient documentation

## 2022-11-24 DIAGNOSIS — E039 Hypothyroidism, unspecified: Secondary | ICD-10-CM | POA: Diagnosis not present

## 2022-11-24 DIAGNOSIS — E119 Type 2 diabetes mellitus without complications: Secondary | ICD-10-CM | POA: Diagnosis not present

## 2022-11-24 DIAGNOSIS — R339 Retention of urine, unspecified: Secondary | ICD-10-CM | POA: Diagnosis not present

## 2022-11-24 DIAGNOSIS — Z85828 Personal history of other malignant neoplasm of skin: Secondary | ICD-10-CM | POA: Insufficient documentation

## 2022-11-24 DIAGNOSIS — I10 Essential (primary) hypertension: Secondary | ICD-10-CM | POA: Diagnosis not present

## 2022-11-24 DIAGNOSIS — D72829 Elevated white blood cell count, unspecified: Secondary | ICD-10-CM | POA: Diagnosis not present

## 2022-11-24 DIAGNOSIS — R109 Unspecified abdominal pain: Secondary | ICD-10-CM | POA: Diagnosis present

## 2022-11-24 DIAGNOSIS — Z79899 Other long term (current) drug therapy: Secondary | ICD-10-CM | POA: Insufficient documentation

## 2022-11-24 LAB — CBC
HCT: 47.1 % — ABNORMAL HIGH (ref 36.0–46.0)
Hemoglobin: 15.9 g/dL — ABNORMAL HIGH (ref 12.0–15.0)
MCH: 31.5 pg (ref 26.0–34.0)
MCHC: 33.8 g/dL (ref 30.0–36.0)
MCV: 93.3 fL (ref 80.0–100.0)
Platelets: 384 10*3/uL (ref 150–400)
RBC: 5.05 MIL/uL (ref 3.87–5.11)
RDW: 13.9 % (ref 11.5–15.5)
WBC: 11.3 10*3/uL — ABNORMAL HIGH (ref 4.0–10.5)
nRBC: 0 % (ref 0.0–0.2)

## 2022-11-24 LAB — COMPREHENSIVE METABOLIC PANEL
ALT: 26 U/L (ref 0–44)
AST: 53 U/L — ABNORMAL HIGH (ref 15–41)
Albumin: 3.6 g/dL (ref 3.5–5.0)
Alkaline Phosphatase: 95 U/L (ref 38–126)
Anion gap: 17 — ABNORMAL HIGH (ref 5–15)
BUN: <5 mg/dL — ABNORMAL LOW (ref 6–20)
CO2: 20 mmol/L — ABNORMAL LOW (ref 22–32)
Calcium: 9.4 mg/dL (ref 8.9–10.3)
Chloride: 106 mmol/L (ref 98–111)
Creatinine, Ser: 0.53 mg/dL (ref 0.44–1.00)
GFR, Estimated: >60 mL/min (ref >60)
Glucose, Bld: 82 mg/dL (ref 70–99)
Potassium: 4.5 mmol/L (ref 3.5–5.1)
Sodium: 143 mmol/L (ref 135–145)
Total Bilirubin: 1.3 mg/dL — ABNORMAL HIGH (ref 0.3–1.2)
Total Protein: 8.1 g/dL (ref 6.5–8.1)

## 2022-11-24 LAB — URINALYSIS, ROUTINE W REFLEX MICROSCOPIC
Bilirubin Urine: NEGATIVE
Glucose, UA: NEGATIVE mg/dL
Hgb urine dipstick: NEGATIVE
Ketones, ur: NEGATIVE mg/dL
Leukocytes,Ua: NEGATIVE
Nitrite: NEGATIVE
Protein, ur: NEGATIVE mg/dL
Specific Gravity, Urine: 1.008 (ref 1.005–1.030)
pH: 5 (ref 5.0–8.0)

## 2022-11-24 LAB — LIPASE, BLOOD: Lipase: 25 U/L (ref 11–51)

## 2022-11-24 MED ORDER — SODIUM CHLORIDE 0.9 % IV BOLUS
1000.0000 mL | Freq: Once | INTRAVENOUS | Status: AC
  Administered 2022-11-24: 1000 mL via INTRAVENOUS

## 2022-11-24 MED ORDER — HYDROMORPHONE HCL 1 MG/ML IJ SOLN
0.5000 mg | Freq: Once | INTRAMUSCULAR | Status: AC
  Administered 2022-11-24: 0.5 mg via INTRAVENOUS
  Filled 2022-11-24: qty 1

## 2022-11-24 MED ORDER — TIZANIDINE HCL 4 MG PO TABS: 12.0000 mg | ORAL_TABLET | Freq: Three times a day (TID) | ORAL | 0 refills | Status: DC | PRN

## 2022-11-24 MED ORDER — TIZANIDINE HCL 4 MG PO TABS
4.0000 mg | ORAL_TABLET | Freq: Once | ORAL | Status: AC
  Administered 2022-11-24: 4 mg via ORAL
  Filled 2022-11-24: qty 1

## 2022-11-24 MED ORDER — METOCLOPRAMIDE HCL 5 MG/ML IJ SOLN
10.0000 mg | Freq: Once | INTRAMUSCULAR | Status: AC
  Administered 2022-11-24: 10 mg via INTRAVENOUS
  Filled 2022-11-24: qty 2

## 2022-11-24 NOTE — ED Notes (Signed)
Lt top sent to the lab

## 2022-11-24 NOTE — ED Notes (Signed)
Lab draw x 2 unsuccessful  

## 2022-11-24 NOTE — ED Notes (Signed)
Lab stated they are sending phlebotomist down to stick patient.

## 2022-11-24 NOTE — ED Notes (Signed)
Attempted x 2 to obtain lab work.  Unsuccessful x 2.  Reports she normally has to have IV team/US IV.

## 2022-11-24 NOTE — ED Notes (Signed)
Pt asked me to call her husband to pick her up. I left him a voicemail about pat being discharged.  She also said she did not want to go home because she felt that she is sick. In explained to her that she needs to see a urologist to tend to the urinary retention

## 2022-11-24 NOTE — ED Notes (Signed)
Spoke with lab about getting a phlebotomist to collect blood. I was transferred to phlebotomy line and it was not answered. I called back and no one answered.

## 2022-11-24 NOTE — ED Triage Notes (Signed)
Patient coming to ED for evaluation of abdominal pain.  Reports symptoms started 12 hours ago.  Has had nausea and vomiting.  Reports she was constipated earlier in the week.  Enema resulted in "good" BM.

## 2022-11-24 NOTE — ED Notes (Signed)
Writer attempted to look for an IV on both arms and was unable to find anything worth attempting to stick.

## 2022-11-24 NOTE — ED Notes (Signed)
Called the phlebotomy number to ask for a blood draw because we have not been able to get blood from this patient. Her IV is not drawing any blood and it's hard to find any veins for a straight stick. Phlebotomy states it may take a while because there are only two of them.

## 2022-11-24 NOTE — ED Provider Notes (Signed)
[facility]PARTMENT AT [facility] Provider Note  CSN: [id number] Arrival date & time: 11/24/22 0005  Chief Complaint(s) Abdominal Pain  HPI [name] is a 52 y.o. female with a past medical history listed below who presents to the emergency department with diffuse abdominal pain for the past 12 hours.  She is endorsing nausea and nonbloody nonbilious emesis.  States that she has been constipated all week.  States that she had an enema earlier today and had a good bowel movement.  No improvement in abdominal pain per patient.  Reports dysuria. She denies any recent fevers or infections.  No cough or congestion.  No other physical complaints.  HPI  Past Medical History Past Medical History:  Diagnosis Date   Anxiety    Arthritis    "joints ache all over" (10/15/2014)   Barrett's esophagus    Bulging lumbar disc    Cardiac arrest (HCC)    Chronic lower back pain    Coronary artery disease    DDD (degenerative disc disease), cervical    Depression    DM (diabetes mellitus) (HCC)    Drug-seeking behavior    Headache    "weekly" (10/15/2014)   Hyperlipemia    Hypertension    PTSD (post-traumatic stress disorder)    Skin cancer    "had them cut off my arms; don't know what kind"   Patient Active Problem List   Diagnosis Date Noted   Positive urine drug screen 07/19/2022   Cyclic vomiting syndrome 07/18/2022   borderline prolonged QT interval 07/18/2022   Dysuria 07/18/2022   Mixed diabetic hyperlipidemia associated with type 2 diabetes mellitus (HCC) 06/27/2022   Compression fracture of fourth lumbar vertebra (HCC) 06/25/2022   RLQ abdominal pain 05/23/2022   Cyclical vomiting with nausea 05/21/2022   Colitis 03/22/2022   Acute colitis 03/22/2022   Dehydration 03/22/2022   Multiple rib fractures 02/03/2022   Barrett's esophagus    Cyclical vomiting syndrome 09/18/2021   Chronic pain syndrome 09/18/2021   Class 2 obesity due to excess calories  with body mass index (BMI) of 37.0 to 37.9 in adult 09/18/2021   Hypothyroidism 09/18/2021   Starvation ketoacidosis 08/27/2021   Viral gastroenteritis 08/27/2021   Intractable vomiting 08/02/2021   Aortic atherosclerosis (HCC) 08/02/2021   LFT elevation 08/02/2021   Hyperbilirubinemia 08/02/2021   Hypoalbuminemia 08/02/2021   Type 2 diabetes mellitus without complication, without long-term current use of insulin (HCC) 08/02/2021   Anoxic brain damage (HCC) 08/16/2020   History of depression    Sleep disturbance    Anxiety state    N&V (nausea and vomiting)    Substance abuse (HCC)    Anemia of chronic disease    Acute metabolic encephalopathy 06/23/2020   Hematochezia    Acute blood loss anemia    Acute respiratory failure with hypoxia (HCC)    Non-traumatic rhabdomyolysis    Cardiac arrest (HCC) 06/08/2020   Fracture of ankle, bimalleolar, right, closed 03/28/2020   Iron deficiency 05/05/2019   Symptomatic anemia 05/05/2019   Nausea & vomiting 12/26/2018   Essential hypertension 12/26/2018   ARF (acute renal failure) (HCC) 12/26/2018   Intractable nausea and vomiting 11/28/2018   Bipolar affective disorder (HCC) 11/28/2018   Gastroenteritis 11/19/2015   Hypomagnesemia    C. difficile colitis    SIRS (systemic inflammatory response syndrome) (HCC) 01/11/2015   Cellulitis 01/03/2015   Opiate withdrawal (HCC) 12/18/2014   Depression with anxiety 12/17/2014   Herpes simplex virus type 1 (HSV-1)  dermatitis    Sacral fracture (HCC) 12/12/2014   Fall at home, initial encounter 12/12/2014   Chronic lower back pain 12/12/2014   Sacral fracture, closed (HCC) 12/12/2014   Nausea with vomiting    Intractable abdominal pain 10/15/2014   Abdominal pain 10/15/2014   Hypokalemia 09/04/2014   Sepsis (HCC) 09/01/2014   Abdominal pain, generalized 09/01/2014   PTSD (post-traumatic stress disorder) 09/01/2014   Endometriosis 09/01/2014   Inappropriate sinus tachycardia 09/01/2014    Lactic acidosis 09/01/2014   Sepsis secondary to UTI (HCC) 09/01/2014   Home Medication(s) Prior to Admission medications   Medication Sig Start Date End Date Taking? Authorizing Provider  acetaminophen (TYLENOL) 650 MG CR tablet Take 1,300 mg by mouth as needed for pain.    [provider]  ARIPiprazole (ABILIFY) 5 MG tablet Take 1 tablet (5 mg total) by mouth daily. 08/01/20   Marcello Fennel, MD  atorvastatin (LIPITOR) 10 MG tablet Take 10 mg by mouth every evening. 08/26/20   [provider]  dicyclomine (BENTYL) 20 MG tablet Take 1 tablet (20 mg total) by mouth 2 (two) times daily for 5 days. 07/27/22 08/01/22  Sloan Leiter, DO  FLUoxetine (PROZAC) 40 MG capsule Take 1 capsule (40 mg total) by mouth daily. 07/01/20   Angiulli, Mcarthur Rossetti, PA-C  ibuprofen (ADVIL) 200 MG tablet Take 400 mg by mouth 3 (three) times daily as needed for moderate pain.    [provider]  levothyroxine (SYNTHROID) 50 MCG tablet Take 50 mcg by mouth daily before breakfast.    [provider]  lidocaine (LIDODERM) 5 % Place 1 patch onto the skin daily. Remove & Discard patch within 12 hours or as directed by MD Patient taking differently: Place 1 patch onto the skin as needed (pain). 07/01/22   Shalhoub, Deno Lunger, MD  LIDOCAINE EX Apply 1 Application topically as needed (pain). Cream    [provider]  methocarbamol (ROBAXIN) 500 MG tablet Take 1 tablet (500 mg total) by mouth 2 (two) times daily. 08/24/22   Darrick Grinder, PA-C  metoCLOPramide (REGLAN) 5 MG tablet Take 5 mg by mouth 2 (two) times daily. 07/10/22   [provider]  metoprolol succinate (TOPROL-XL) 25 MG 24 hr tablet Take 1 tablet (25 mg total) by mouth daily. Follow up with your PCP regarding your heart rate and whether this should be continued. Patient taking differently: Take 25 mg by mouth every evening. 07/14/22 08/13/22  Zigmund Daniel., MD  multivitamin (RENA-VIT) TABS tablet Take 1 tablet  by mouth at bedtime. 08/01/20   Marcello Fennel, MD  ondansetron (ZOFRAN) 4 MG tablet Take 1 tablet (4 mg total) by mouth every 6 (six) hours. 08/04/22   Gareth Eagle, PA-C  ondansetron (ZOFRAN-ODT) 8 MG disintegrating tablet Take 1 tablet (8 mg total) by mouth every 8 (eight) hours as needed for nausea or vomiting. Patient taking differently: Take 8 mg by mouth as needed for nausea or vomiting. 05/03/22   Tressia Danas, MD  oxyCODONE-acetaminophen (PERCOCET) 7.5-325 MG tablet Take 1 tablet by mouth 4 (four) times daily.    [provider]  pantoprazole (PROTONIX) 40 MG tablet Take 1 tablet by mouth twice daily 09/21/22   Meredith Pel, NP  potassium chloride SA (KLOR-CON M) 20 MEQ tablet Take 1 tablet (20 mEq total) by mouth daily for 3 days. 07/27/22 07/30/22  Sloan Leiter, DO  prazosin (MINIPRESS) 2 MG capsule Take 1 capsule (2 mg total) by  mouth at bedtime. 08/01/20   Marcello Fennel, MD  promethazine (PHENERGAN) 25 MG suppository Place 1 suppository (25 mg total) rectally every 8 (eight) hours as needed for nausea, vomiting or refractory nausea / vomiting. 09/07/22   Peter Garter, PA  promethazine (PHENERGAN) 25 MG tablet Take 1 tablet (25 mg total) by mouth every 8 (eight) hours as needed for nausea or vomiting. 09/25/22   Arby Barrette, MD  sucralfate (CARAFATE) 1 g tablet Take 1 tablet (1 g total) by mouth with breakfast, with lunch, and with evening meal for 7 days. 07/27/22 08/03/22  Sloan Leiter, DO  tiZANidine (ZANAFLEX) 4 MG tablet Take 3 tablets (12 mg total) by mouth every 8 (eight) hours as needed. 07/20/22   Lewie Chamber, MD  traZODone (DESYREL) 150 MG tablet Take 150 mg by mouth at bedtime.    [provider]                                                                                                                                    Allergies Patient has no known allergies.  Review of Systems Review of Systems As noted in HPI  Physical  Exam Vital Signs  I have reviewed the triage vital signs BP (!) 113/51   Pulse 88   Temp 99.7 F (37.6 C) (Oral)   Resp 18   Ht 5\' 4"  (1.626 m)   Wt 79.4 kg   LMP 06/03/2018 (Approximate) Comment: neg hcg 05/10/20  SpO2 100%   BMI 30.04 kg/m   Physical Exam Vitals reviewed.  Constitutional:      General: She is not in acute distress.    Appearance: She is well-developed. She is not diaphoretic.  HENT:     Head: Normocephalic and atraumatic.     Right Ear: External ear normal.     Left Ear: External ear normal.     Nose: Nose normal.  Eyes:     General: No scleral icterus.    Conjunctiva/sclera: Conjunctivae normal.  Neck:     Trachea: Phonation normal.  Cardiovascular:     Rate and Rhythm: Normal rate and regular rhythm.  Pulmonary:     Effort: Pulmonary effort is normal. No respiratory distress.     Breath sounds: No stridor.  Abdominal:     General: There is distension.     Tenderness: There is generalized abdominal tenderness.  Musculoskeletal:        General: Normal range of motion.     Cervical back: Normal range of motion.  Neurological:     Mental Status: She is alert and oriented to person, place, and time.  Psychiatric:        Behavior: Behavior normal.     ED Results and Treatments Labs (all labs ordered are listed, but only abnormal results are displayed) Labs Reviewed  CBC - Abnormal; Notable for the following components:      Result Value  WBC 11.3 (*)    Hemoglobin 15.9 (*)    HCT 47.1 (*)    All other components within normal limits  URINALYSIS, ROUTINE W REFLEX MICROSCOPIC  COMPREHENSIVE METABOLIC PANEL  LIPASE, BLOOD                                                                                                                         EKG  EKG Interpretation Date/Time:    Ventricular Rate:    PR Interval:    QRS Duration:    QT Interval:    QTC Calculation:   R Axis:      Text Interpretation:         Radiology CT ABDOMEN  PELVIS WO CONTRAST  Result Date: 11/24/2022 CLINICAL DATA:  Abdominal pain for 12 hours. EXAM: CT ABDOMEN AND PELVIS WITHOUT CONTRAST TECHNIQUE: Multidetector CT imaging of the abdomen and pelvis was performed following the standard protocol without IV contrast. RADIATION DOSE REDUCTION: This exam was performed according to the departmental dose-optimization program which includes automated exposure control, adjustment of the mA and/or kV according to patient size and/or use of iterative reconstruction technique. COMPARISON:  09/25/2022 FINDINGS: Lower chest:  No contributory findings. Hepatobiliary: Hepatic steatosis.No evidence of biliary obstruction or stone. Pancreas: Unremarkable. Spleen: Unremarkable. Adrenals/Urinary Tract: Negative adrenals. No hydronephrosis or stone. Physiologic fullness of the bladder without wall thickening. Stomach/Bowel:  No obstruction. No visible bowel inflammation. Vascular/Lymphatic: No acute vascular abnormality. No mass or adenopathy. Reproductive:No pathologic findings. Other: No ascites or pneumoperitoneum. Musculoskeletal: No acute abnormalities. Chronic T11, L3, and L4 superior endplate fractures. IMPRESSION: 1. No acute or interval finding. No bowel obstruction or visible inflammation. 2. Hepatic steatosis Electronically Signed   By: Tiburcio Pea M.D.   On: 11/24/2022 06:50    Medications Ordered in ED Medications  sodium chloride 0.9 % bolus 1,000 mL (0 mLs Intravenous Stopped 11/24/22 0649)  metoCLOPramide (REGLAN) injection 10 mg (10 mg Intravenous Given 11/24/22 0401)  HYDROmorphone (DILAUDID) injection 0.5 mg (0.5 mg Intravenous Given 11/24/22 0434)  HYDROmorphone (DILAUDID) injection 0.5 mg (0.5 mg Intravenous Given 11/24/22 0725)   Procedures Procedures  (including critical care time) Medical Decision Making / ED Course   Medical Decision Making Amount and/or Complexity of Data Reviewed Labs: ordered. Radiology: ordered.  Risk Prescription drug  management.    Differential considered listed below  Abdominal pain with nausea vomiting. Distended abdomen noted on exam  Will assess for evidence of bowel obstruction.  Will also assess for evidence of intra-abdominal inflammatory/infectious process.  Patient is diabetic will assess for metabolic/electrolyte derangements.  Will check for UTI.  Given age and likely pregnancy related process.  Care was delayed due to difficulty obtaining IV axis despite numerous attempts.  IV team was able to place an IV.  Labs sent but hemolyzed.  CBC was obtained and showed mild leukocytosis in setting of hemoconcentration. Still awaiting CMP and lipase.  Clinical Course as of 11/24/22 0748  Sat Nov 24, 2022  0347  CT Noncon obtained given delay of obtaining labs. No evidence of small bowel obstruction.  Bladder is distended concerning for urinary retention.  Will BladderScan and encourage patient to void.  If she is unable to, will insert Foley catheter. [PC]  0720 Bladder scan revealed greater than 500 cc in the bladder.  Foley was inserted.  UA without evidence of infection.  Still awaiting CMP and lipase.  Patient care turned over to oncoming provider. Patient case and results discussed in detail; please see their note for further ED managment.   [PC]    Clinical Course User Index [PC] , Amadeo Garnet, MD    Final Clinical Impression(s) / ED Diagnoses Final diagnoses:  Urinary retention    This chart was dictated using voice recognition software.  Despite best efforts to proofread,  errors can occur which can change the documentation meaning.    Nira Conn, MD 11/24/22 1949

## 2022-11-24 NOTE — ED Notes (Signed)
Pt reminded of need for urine specimen 

## 2022-11-24 NOTE — ED Notes (Signed)
Unable to start IV via [place]. IV team consulted. Blood sent to lab per order.

## 2022-11-24 NOTE — ED Notes (Signed)
IV is slipping out and we cannot get blood drawn and so I put in an order for the IV team

## 2022-11-24 NOTE — ED Provider Notes (Signed)
Patient signed to me or Dr., Pending results of her lab test which came back negative.  Review of her chart shows that she has a history of gas paresis and chronic abdominal pain.  She is not vomiting at this time.  Will discharge   Lorre Nick, MD 11/24/22 1315

## 2022-12-18 ENCOUNTER — Other Ambulatory Visit: Payer: Self-pay

## 2022-12-18 ENCOUNTER — Emergency Department (HOSPITAL_COMMUNITY)
Admission: EM | Admit: 2022-12-18 | Discharge: 2022-12-19 | Disposition: A | Discharge status: Home / Self Care | Payer: Medicare Other | Attending: Emergency Medicine | Admitting: Emergency Medicine

## 2022-12-18 ENCOUNTER — Encounter (HOSPITAL_COMMUNITY): Payer: Self-pay

## 2022-12-18 DIAGNOSIS — R109 Unspecified abdominal pain: Secondary | ICD-10-CM

## 2022-12-18 DIAGNOSIS — I10 Essential (primary) hypertension: Secondary | ICD-10-CM | POA: Insufficient documentation

## 2022-12-18 DIAGNOSIS — I2510 Atherosclerotic heart disease of native coronary artery without angina pectoris: Secondary | ICD-10-CM | POA: Insufficient documentation

## 2022-12-18 DIAGNOSIS — Z79899 Other long term (current) drug therapy: Secondary | ICD-10-CM | POA: Insufficient documentation

## 2022-12-18 DIAGNOSIS — E119 Type 2 diabetes mellitus without complications: Secondary | ICD-10-CM | POA: Diagnosis not present

## 2022-12-18 DIAGNOSIS — R1031 Right lower quadrant pain: Secondary | ICD-10-CM | POA: Diagnosis present

## 2022-12-18 LAB — CBC
HCT: 40.9 % (ref 36.0–46.0)
Hemoglobin: 13 g/dL (ref 12.0–15.0)
MCH: 29.6 pg (ref 26.0–34.0)
MCHC: 31.8 g/dL (ref 30.0–36.0)
MCV: 93.2 fL (ref 80.0–100.0)
Platelets: 294 10*3/uL (ref 150–400)
RBC: 4.39 MIL/uL (ref 3.87–5.11)
RDW: 14 % (ref 11.5–15.5)
WBC: 9.9 10*3/uL (ref 4.0–10.5)
nRBC: 0 % (ref 0.0–0.2)

## 2022-12-18 MED ORDER — MORPHINE SULFATE (PF) 4 MG/ML IV SOLN
4.0000 mg | Freq: Once | INTRAVENOUS | Status: AC
  Administered 2022-12-18: 4 mg via INTRAVENOUS
  Filled 2022-12-18: qty 1

## 2022-12-18 MED ORDER — LACTATED RINGERS IV BOLUS
1000.0000 mL | Freq: Once | INTRAVENOUS | Status: AC
  Administered 2022-12-18: 1000 mL via INTRAVENOUS

## 2022-12-18 MED ORDER — METOCLOPRAMIDE HCL 5 MG/ML IJ SOLN
10.0000 mg | Freq: Once | INTRAMUSCULAR | Status: AC
  Administered 2022-12-18: 10 mg via INTRAVENOUS
  Filled 2022-12-18: qty 2

## 2022-12-18 NOTE — ED Provider Notes (Signed)
EMERGENCY DEPARTMENT AT [facility] Provider Note   CSN: [id number] Arrival date & time: 12/18/22  1411     History  Chief Complaint  Patient presents with   Abdominal Pain    NAREH YOUNGHANS is a 52 y.o. female history of PTSD, depression, hypertension, hyperlipidemia, chronic back pain, abdominal pain, anxiety, diabetes, CAD, who presents the emergency department complaining of abdominal pain, nausea and vomiting starting yesterday.  Localizes pain to the middle and right lower quadrant of the abdomen. States that she is also been constipated.  She has previously seen GI for the symptoms and was prescribed Reglan which normally helps for her.  She did take this earlier with very minimal relief. No fever or chills. No urinary sx. No vaginal bleeding or discharge. Denies hx of abdominal surgery.    Abdominal Pain Associated symptoms: constipation, nausea and vomiting        Home Medications Prior to Admission medications   Medication Sig Start Date End Date Taking? Authorizing Provider  acetaminophen (TYLENOL) 650 MG CR tablet Take 1,300 mg by mouth as needed for pain.    [provider]  ARIPiprazole (ABILIFY) 5 MG tablet Take 1 tablet (5 mg total) by mouth daily. 08/01/20   Marcello Fennel, MD  atorvastatin (LIPITOR) 10 MG tablet Take 10 mg by mouth every evening. 08/26/20   [provider]  dicyclomine (BENTYL) 20 MG tablet Take 1 tablet (20 mg total) by mouth 2 (two) times daily for 5 days. 07/27/22 08/01/22  Sloan Leiter, DO  FLUoxetine (PROZAC) 40 MG capsule Take 1 capsule (40 mg total) by mouth daily. 07/01/20   Angiulli, Mcarthur Rossetti, PA-C  ibuprofen (ADVIL) 200 MG tablet Take 400 mg by mouth 3 (three) times daily as needed for moderate pain.    [provider]  levothyroxine (SYNTHROID) 50 MCG tablet Take 50 mcg by mouth daily before breakfast.    [provider]  lidocaine (LIDODERM) 5 % Place 1 patch onto the skin daily.  Remove & Discard patch within 12 hours or as directed by MD Patient taking differently: Place 1 patch onto the skin as needed (pain). 07/01/22   Shalhoub, Deno Lunger, MD  LIDOCAINE EX Apply 1 Application topically as needed (pain). Cream    [provider]  methocarbamol (ROBAXIN) 500 MG tablet Take 1 tablet (500 mg total) by mouth 2 (two) times daily. 08/24/22   Darrick Grinder, PA-C  metoCLOPramide (REGLAN) 5 MG tablet Take 5 mg by mouth 2 (two) times daily. 07/10/22   [provider]  metoprolol succinate (TOPROL-XL) 25 MG 24 hr tablet Take 1 tablet (25 mg total) by mouth daily. Follow up with your PCP regarding your heart rate and whether this should be continued. Patient taking differently: Take 25 mg by mouth every evening. 07/14/22 08/13/22  Zigmund Daniel., MD  multivitamin (RENA-VIT) TABS tablet Take 1 tablet by mouth at bedtime. 08/01/20   Marcello Fennel, MD  ondansetron (ZOFRAN) 4 MG tablet Take 1 tablet (4 mg total) by mouth every 6 (six) hours. 08/04/22   Gareth Eagle, PA-C  ondansetron (ZOFRAN-ODT) 8 MG disintegrating tablet Take 1 tablet (8 mg total) by mouth every 8 (eight) hours as needed for nausea or vomiting. Patient taking differently: Take 8 mg by mouth as needed for nausea or vomiting. 05/03/22   Tressia Danas, MD  oxyCODONE-acetaminophen (PERCOCET) 7.5-325 MG tablet Take 1 tablet by mouth 4 (four) times daily.  [provider]  pantoprazole (PROTONIX) 40 MG tablet Take 1 tablet by mouth twice daily 09/21/22   Meredith Pel, NP  potassium chloride SA (KLOR-CON M) 20 MEQ tablet Take 1 tablet (20 mEq total) by mouth daily for 3 days. 07/27/22 07/30/22  Sloan Leiter, DO  prazosin (MINIPRESS) 2 MG capsule Take 1 capsule (2 mg total) by mouth at bedtime. 08/01/20   Marcello Fennel, MD  promethazine (PHENERGAN) 25 MG suppository Place 1 suppository (25 mg total) rectally every 8 (eight) hours as needed for nausea, vomiting or refractory nausea /  vomiting. 09/07/22   Peter Garter, PA  promethazine (PHENERGAN) 25 MG tablet Take 1 tablet (25 mg total) by mouth every 8 (eight) hours as needed for nausea or vomiting. 09/25/22   Arby Barrette, MD  sucralfate (CARAFATE) 1 g tablet Take 1 tablet (1 g total) by mouth with breakfast, with lunch, and with evening meal for 7 days. 07/27/22 08/03/22  Sloan Leiter, DO  tiZANidine (ZANAFLEX) 4 MG tablet Take 3 tablets (12 mg total) by mouth every 8 (eight) hours as needed. 11/24/22   Lorre Nick, MD  traZODone (DESYREL) 150 MG tablet Take 150 mg by mouth at bedtime.    [provider]      Allergies    Patient has no known allergies.    Review of Systems   Review of Systems  Gastrointestinal:  Positive for abdominal pain, constipation, nausea and vomiting.  All other systems reviewed and are negative.   Physical Exam Updated Vital Signs BP (!) 168/96 (BP Location: Right Arm)   Pulse (!) 104   Temp 99.3 F (37.4 C) (Oral)   Resp (!) 22   Ht 5\' 4"  (1.626 m)   Wt 86.2 kg   LMP 06/03/2018 (Approximate) Comment: neg hcg 05/10/20  SpO2 97%   BMI 32.61 kg/m  Physical Exam Vitals and nursing note reviewed.  Constitutional:      Appearance: Normal appearance.  HENT:     Head: Normocephalic and atraumatic.  Eyes:     Conjunctiva/sclera: Conjunctivae normal.  Cardiovascular:     Rate and Rhythm: Normal rate and regular rhythm.  Pulmonary:     Effort: Pulmonary effort is normal. No respiratory distress.     Breath sounds: Normal breath sounds.  Abdominal:     General: There is no distension.     Palpations: Abdomen is soft.     Tenderness: There is abdominal tenderness in the right lower quadrant and periumbilical area.  Skin:    General: Skin is warm and dry.  Neurological:     General: No focal deficit present.     Mental Status: She is alert.     ED Results / Procedures / Treatments   Labs (all labs ordered are listed, but only abnormal results are  displayed) Labs Reviewed  LIPASE, BLOOD  COMPREHENSIVE METABOLIC PANEL  CBC  URINALYSIS, ROUTINE W REFLEX MICROSCOPIC    EKG None  Radiology No results found.  Procedures Procedures    Medications Ordered in ED Medications  lactated ringers bolus 1,000 mL (1,000 mLs Intravenous New Bag/Given 12/18/22 2331)  metoCLOPramide (REGLAN) injection 10 mg (10 mg Intravenous Given 12/18/22 2331)  morphine (PF) 4 MG/ML injection 4 mg (4 mg Intravenous Given 12/18/22 2331)    ED Course/ Medical Decision Making/ A&P  Medical Decision Making Amount and/or Complexity of Data Reviewed Labs: ordered.  Risk Prescription drug management.   This patient is a 52 y.o. female  who presents to the ED for concern of abdominal pain x 1 day.   Differential diagnoses prior to evaluation: The emergent differential diagnosis includes, but is not limited to,  AAA, mesenteric ischemia, appendicitis, diverticulitis, DKA, gastroenteritis, nephrolithiasis, pancreatitis, constipation, UTI, bowel obstruction, biliary disease, IBD, PUD, hepatitis, ovarian torsion, PID. This is not an exhaustive differential.   Past Medical History / Co-morbidities / Social History: PTSD, depression, hypertension, hyperlipidemia, chronic back pain, abdominal pain, anxiety, diabetes, CAD  Additional history: Chart reviewed. Pertinent results include: Reviewed several past ER visits for similar symptoms, most recently on 8/3.  Patient seems to present with ongoing chronic abdominal pain, nausea, vomiting, and concern for either cyclical vomiting versus gastroparesis.  PDMP reviewed.  Most recently received Percocet 7.5-325 with a 30-day supply on 8/14  Reviewed several past CT abdominal images which have all been unremarkable.  Most recently on 8/3.  Physical Exam: Physical exam performed. The pertinent findings include: Mildly tachycardic, hypertensive, otherwise normal vital signs.  No acute  distress.  Abdomen soft, with periumbilical and right lower quadrant tenderness without guarding.  Lab Tests/Imaging studies: Labs ordered including: CBC, CMP, lipase, and urinalysis. Pending at time of shift change.   Medications: I ordered medication including IV fluids, Reglan, morphine.  I have reviewed the patients home medicines and have made adjustments as needed.   Disposition: Patient discussed and care transferred to [facility] at shift change. Please see his/her note for further details regarding further ED course and disposition. Plan at time of handoff is follow up on labs. Symptoms sound similar to prior presentations. Anticipate f/u on pain and PO challenge and d/c to home. Already sees GI.    Final Clinical Impression(s) / ED Diagnoses Final diagnoses:  None    Rx / DC Orders ED Discharge Orders     None      Portions of this report may have been transcribed using voice recognition software. Every effort was made to ensure accuracy; however, inadvertent computerized transcription errors may be present.    Jeanella Flattery 12/18/22 2334    Tegeler, Canary Brim, MD 12/19/22 1026

## 2022-12-18 NOTE — ED Triage Notes (Signed)
N/V and abdominal pain to RLQ since yesterday. Denies abdominal surgery, denies diarrhea.

## 2022-12-19 LAB — COMPREHENSIVE METABOLIC PANEL
ALT: 19 U/L (ref 0–44)
AST: 33 U/L (ref 15–41)
Albumin: 3.6 g/dL (ref 3.5–5.0)
Alkaline Phosphatase: 68 U/L (ref 38–126)
Anion gap: 10 (ref 5–15)
BUN: 5 mg/dL — ABNORMAL LOW (ref 6–20)
CO2: 27 mmol/L (ref 22–32)
Calcium: 9.2 mg/dL (ref 8.9–10.3)
Chloride: 103 mmol/L (ref 98–111)
Creatinine, Ser: 0.73 mg/dL (ref 0.44–1.00)
GFR, Estimated: >60 mL/min (ref >60)
Glucose, Bld: 110 mg/dL — ABNORMAL HIGH (ref 70–99)
Potassium: 4 mmol/L (ref 3.5–5.1)
Sodium: 140 mmol/L (ref 135–145)
Total Bilirubin: 0.8 mg/dL (ref 0.3–1.2)
Total Protein: 7.7 g/dL (ref 6.5–8.1)

## 2022-12-19 LAB — LIPASE, BLOOD: Lipase: 20 U/L (ref 11–51)

## 2022-12-19 MED ORDER — TIZANIDINE HCL 4 MG PO TABS: 12.0000 mg | ORAL_TABLET | Freq: Three times a day (TID) | ORAL | 0 refills | Status: DC | PRN

## 2022-12-19 MED ORDER — ACETAMINOPHEN 325 MG PO TABS
650.0000 mg | ORAL_TABLET | Freq: Once | ORAL | Status: AC
  Administered 2022-12-19: 650 mg via ORAL
  Filled 2022-12-19: qty 2

## 2022-12-19 NOTE — Discharge Instructions (Addendum)
Please follow-up with your GI specialist regarding symptoms and ER visit.  Today your labs were reassuring as we discussed we decided to forego CT scan at this time.  I have filled your Zanaflex for you at your request however you will need to follow-up with your GI specialist due to your chronic abdominal pain.  If symptoms change or worsen please return to ER.

## 2022-12-19 NOTE — ED Provider Notes (Incomplete)
Patient given in sign out by [employer], PA-C.  Please review their note for patient HPI, physical exam, workup.  At this time the plan is to follow-up on abdominal labs and anticipate discharge.

## 2022-12-19 NOTE — ED Provider Notes (Signed)
Patient given in sign out by [employer], PA-C.  Please review their note for patient HPI, physical exam, workup.  At this time the plan is to follow-up on abdominal labs and anticipate discharge.  When I went to go evaluate the patient patient was resting comfortably on her phone and in no acute distress with stable vitals.  Patient's physical exam was unremarkable as patient had soft nontender nonperitoneal abdomen along with reassuring cardiac and pulmonary exam and back exam.  Patient states that this is her chronic abdominal pain however after receiving the Reglan and fluids earlier that her symptoms have gotten better.  Patient told the nurse that the morphine did not help however when I spoke to the patient patient stated that she was feeling better but still wants pain meds.  I spoke to the patient about how it is not standard of care to give opioids for chronic abdominal pain as it can worsen her abdominal pain and patient verbalized understanding and stated that she wanted a Tylenol instead.  Tylenol will be ordered prior to discharge.  Patient is on Percocets at home as well and so I encouraged her to continue taking these for her abdominal pain as prescribed.  Patient did request that her Zanaflex be refilled and so these will be refilled.  I strongly encourage patient follow-up with her GI specialist due to her chronic abdominal pain.  Patient does have multiple CT scans done over the past few months for chronic abdominal pain that have ultimately been reassuring and given patient's labs and physical exam patient does not need a CT scan today and we had shared decision making to forego CT scan.  Patient stable for discharge.  Patient verbalized understanding and acceptance of this plan.   Netta Corrigan, PA-C 12/19/22 0047    Glynn Octave, MD 12/19/22 [phone]

## 2023-03-01 ENCOUNTER — Other Ambulatory Visit (INDEPENDENT_AMBULATORY_CARE_PROVIDER_SITE_OTHER): Payer: Medicare Other

## 2023-03-01 ENCOUNTER — Encounter: Payer: Self-pay | Admitting: Internal Medicine

## 2023-03-01 ENCOUNTER — Ambulatory Visit: Payer: Medicare Other | Admitting: Physician Assistant

## 2023-03-01 ENCOUNTER — Encounter: Payer: Self-pay | Admitting: Physician Assistant

## 2023-03-01 DIAGNOSIS — M79671 Pain in right foot: Secondary | ICD-10-CM

## 2023-03-01 DIAGNOSIS — S92514A Nondisplaced fracture of proximal phalanx of right lesser toe(s), initial encounter for closed fracture: Secondary | ICD-10-CM | POA: Diagnosis not present

## 2023-03-01 NOTE — Progress Notes (Signed)
Office Visit Note   Patient: [name]           Date of Birth: [birth year]           MRN: [id number] Visit Date: 03/01/2023              Requested by: Lance Bosch, NP [address] [place],  [place] [address] PCP: Lance Bosch, NP   Assessment & Plan: Visit Diagnoses:  1. Right foot pain     Plan: Proximal phalanx fracture right third toe clinically and radiographically in good position.  Her toes are warm pink brisk capillary refill skin is intact.  I did buddy tape her toes today.  She can change this as she likes we will also give her a postop shoe should follow-up in 3 weeks with new x-rays  Follow-Up Instructions: No follow-ups on file.   Orders:  Orders Placed This Encounter  Procedures   XR Toe 3rd Right   No orders of the defined types were placed in this encounter.     Procedures: No procedures performed   Clinical Data: No additional findings.   Subjective: Chief Complaint  Patient presents with   Right Foot - Pain    HPI Patient is a 52 year old woman who is 3 to 4 days status post kicking metal with her right foot.  She presented to [facility] with right third toe pain.  She was told by x-ray that she had a fracture.  No previous history.  Rates pain is moderate Review of Systems  All other systems reviewed and are negative.    Objective: Vital Signs: LMP 06/03/2018 (Approximate) Comment: neg hcg 05/10/20  Physical Exam Constitutional:      Appearance: Normal appearance.  Pulmonary:     Effort: Pulmonary effort is normal.  Neurological:     General: No focal deficit present.     Mental Status: She is alert and oriented to person, place, and time.  Psychiatric:        Mood and Affect: Mood normal.        Behavior: Behavior normal.     Ortho Exam Examination of her right foot she is neurovascular intact strong dorsalis pedis pulse skin is in good condition no significant bruising she does have swelling isolated to  the third toe.  Toe is warm and pink with brisk capillary refill no clinical deformity other than swelling no other pain in her foot Specialty Comments:  No specialty comments available.  Imaging: No results found.   PMFS History: Patient Active Problem List   Diagnosis Date Noted   Positive urine drug screen 07/19/2022   Cyclic vomiting syndrome 07/18/2022   borderline prolonged QT interval 07/18/2022   Dysuria 07/18/2022   Mixed diabetic hyperlipidemia associated with type 2 diabetes mellitus (HCC) 06/27/2022   Compression fracture of fourth lumbar vertebra (HCC) 06/25/2022   RLQ abdominal pain 05/23/2022   Cyclical vomiting with nausea 05/21/2022   Colitis 03/22/2022   Acute colitis 03/22/2022   Dehydration 03/22/2022   Multiple rib fractures 02/03/2022   Barrett's esophagus    Cyclical vomiting syndrome 09/18/2021   Chronic pain syndrome 09/18/2021   Class 2 obesity due to excess calories with body mass index (BMI) of 37.0 to 37.9 in adult 09/18/2021   Hypothyroidism 09/18/2021   Starvation ketoacidosis 08/27/2021   Viral gastroenteritis 08/27/2021   Intractable vomiting 08/02/2021   Aortic atherosclerosis (HCC) 08/02/2021   LFT elevation 08/02/2021   Hyperbilirubinemia 08/02/2021   Hypoalbuminemia 08/02/2021  Type 2 diabetes mellitus without complication, without long-term current use of insulin (HCC) 08/02/2021   Anoxic brain damage (HCC) 08/16/2020   History of depression    Sleep disturbance    Anxiety state    N&V (nausea and vomiting)    Substance abuse (HCC)    Anemia of chronic disease    Acute metabolic encephalopathy 06/23/2020   Hematochezia    Acute blood loss anemia    Acute respiratory failure with hypoxia (HCC)    Non-traumatic rhabdomyolysis    Cardiac arrest (HCC) 06/08/2020   Fracture of ankle, bimalleolar, right, closed 03/28/2020   Iron deficiency 05/05/2019   Symptomatic anemia 05/05/2019   Nausea & vomiting 12/26/2018   Essential  hypertension 12/26/2018   ARF (acute renal failure) (HCC) 12/26/2018   Intractable nausea and vomiting 11/28/2018   Bipolar affective disorder (HCC) 11/28/2018   Gastroenteritis 11/19/2015   Hypomagnesemia    C. difficile colitis    SIRS (systemic inflammatory response syndrome) (HCC) 01/11/2015   Cellulitis 01/03/2015   Opiate withdrawal (HCC) 12/18/2014   Depression with anxiety 12/17/2014   Herpes simplex virus type 1 (HSV-1) dermatitis    Sacral fracture (HCC) 12/12/2014   Fall at home, initial encounter 12/12/2014   Chronic lower back pain 12/12/2014   Sacral fracture, closed (HCC) 12/12/2014   Nausea with vomiting    Intractable abdominal pain 10/15/2014   Abdominal pain 10/15/2014   Hypokalemia 09/04/2014   Sepsis (HCC) 09/01/2014   Abdominal pain, generalized 09/01/2014   PTSD (post-traumatic stress disorder) 09/01/2014   Endometriosis 09/01/2014   Inappropriate sinus tachycardia (HCC) 09/01/2014   Lactic acidosis 09/01/2014   Sepsis secondary to UTI (HCC) 09/01/2014   Past Medical History:  Diagnosis Date   Anxiety    Arthritis    "joints ache all over" (10/15/2014)   Barrett's esophagus    Bulging lumbar disc    Cardiac arrest (HCC)    Chronic lower back pain    Coronary artery disease    DDD (degenerative disc disease), cervical    Depression    DM (diabetes mellitus) (HCC)    Drug-seeking behavior    Headache    "weekly" (10/15/2014)   Hyperlipemia    Hypertension    PTSD (post-traumatic stress disorder)    Skin cancer    "had them cut off my arms; don't know what kind"    Family History  Problem Relation Age of Onset   Breast cancer Mother    Diabetes Mother    Breast cancer Maternal Grandmother    Breast cancer Paternal Grandmother    Colon polyps Paternal Grandmother    Colon cancer Neg Hx    Esophageal cancer Neg Hx    Rectal cancer Neg Hx    Stomach cancer Neg Hx     Past Surgical History:  Procedure Laterality Date   ABLATION ON  ENDOMETRIOSIS  2008   BIOPSY  12/27/2018   Procedure: BIOPSY;  Surgeon: Tressia Danas, MD;  Location: [facility];  Service: Gastroenterology;;   BIOPSY  10/05/2021   Procedure: BIOPSY;  Surgeon: Iva Boop, MD;  Location: [facility];  Service: Gastroenterology;;   ESOPHAGOGASTRODUODENOSCOPY (EGD) WITH PROPOFOL N/A 12/27/2018   Procedure: ESOPHAGOGASTRODUODENOSCOPY (EGD) WITH PROPOFOL;  Surgeon: Tressia Danas, MD;  Location: [facility];  Service: Gastroenterology;  Laterality: N/A;   ESOPHAGOGASTRODUODENOSCOPY (EGD) WITH PROPOFOL N/A 10/05/2021   Procedure: ESOPHAGOGASTRODUODENOSCOPY (EGD) WITH PROPOFOL;  Surgeon: Iva Boop, MD;  Location: [facility];  Service: Gastroenterology;  Laterality: N/A;   HEMORRHOID  SURGERY  ~ 2002   IR FLUORO GUIDE CV LINE RIGHT  06/14/2020   IR REMOVAL TUN CV CATH W/O FL  06/23/2020   IR US GUIDE VASC ACCESS RIGHT  06/14/2020   ORIF ANKLE FRACTURE Right 03/28/2020   Procedure: OPEN REDUCTION INTERNAL FIXATION (ORIF) RIGHT BIMALLEOLAR ANKLE FRACTURE;  Surgeon: Teryl Lucy, MD;  Location: [facility];  Service: Orthopedics;  Laterality: Right;   Social History   Occupational History   Occupation: unemployed  Tobacco Use   Smoking status: Never   Smokeless tobacco: Never  Vaping Use   Vaping status: Never Used  Substance and Sexual Activity   Alcohol use: Not Currently    Comment: 10/15/2014 "I've drank before; nothing regular; don't drink now cause of RX I'm on"   Drug use: No   Sexual activity: Not Currently

## 2023-04-16 ENCOUNTER — Encounter (HOSPITAL_COMMUNITY): Payer: Self-pay

## 2023-04-16 ENCOUNTER — Emergency Department (HOSPITAL_COMMUNITY)
Admission: EM | Admit: 2023-04-16 | Discharge: 2023-04-17 | Disposition: A | Discharge status: Home / Self Care | Payer: Medicare Other | Attending: Emergency Medicine | Admitting: Emergency Medicine

## 2023-04-16 ENCOUNTER — Other Ambulatory Visit: Payer: Self-pay

## 2023-04-16 ENCOUNTER — Emergency Department (HOSPITAL_COMMUNITY): Payer: Medicare Other

## 2023-04-16 DIAGNOSIS — R1115 Cyclical vomiting syndrome unrelated to migraine: Secondary | ICD-10-CM | POA: Diagnosis not present

## 2023-04-16 DIAGNOSIS — E119 Type 2 diabetes mellitus without complications: Secondary | ICD-10-CM | POA: Insufficient documentation

## 2023-04-16 DIAGNOSIS — R000 Tachycardia, unspecified: Secondary | ICD-10-CM | POA: Diagnosis not present

## 2023-04-16 DIAGNOSIS — I10 Essential (primary) hypertension: Secondary | ICD-10-CM | POA: Insufficient documentation

## 2023-04-16 DIAGNOSIS — I2510 Atherosclerotic heart disease of native coronary artery without angina pectoris: Secondary | ICD-10-CM | POA: Diagnosis not present

## 2023-04-16 DIAGNOSIS — Z79899 Other long term (current) drug therapy: Secondary | ICD-10-CM | POA: Insufficient documentation

## 2023-04-16 DIAGNOSIS — E039 Hypothyroidism, unspecified: Secondary | ICD-10-CM | POA: Diagnosis not present

## 2023-04-16 DIAGNOSIS — D72829 Elevated white blood cell count, unspecified: Secondary | ICD-10-CM | POA: Diagnosis not present

## 2023-04-16 DIAGNOSIS — R109 Unspecified abdominal pain: Secondary | ICD-10-CM | POA: Diagnosis present

## 2023-04-16 LAB — COMPREHENSIVE METABOLIC PANEL
ALT: 27 U/L (ref 0–44)
AST: 33 U/L (ref 15–41)
Albumin: 3.7 g/dL (ref 3.5–5.0)
Alkaline Phosphatase: 92 U/L (ref 38–126)
Anion gap: 12 (ref 5–15)
BUN: 12 mg/dL (ref 6–20)
CO2: 23 mmol/L (ref 22–32)
Calcium: 8.9 mg/dL (ref 8.9–10.3)
Chloride: 95 mmol/L — ABNORMAL LOW (ref 98–111)
Creatinine, Ser: 0.62 mg/dL (ref 0.44–1.00)
GFR, Estimated: >60 mL/min (ref >60)
Glucose, Bld: 158 mg/dL — ABNORMAL HIGH (ref 70–99)
Potassium: 3.7 mmol/L (ref 3.5–5.1)
Sodium: 130 mmol/L — ABNORMAL LOW (ref 135–145)
Total Bilirubin: 0.5 mg/dL (ref <1.2)
Total Protein: 8.1 g/dL (ref 6.5–8.1)

## 2023-04-16 LAB — CBC WITH DIFFERENTIAL/PLATELET
Abs Immature Granulocytes: 0.24 10*3/uL — ABNORMAL HIGH (ref 0.00–0.07)
Basophils Absolute: 0.2 10*3/uL — ABNORMAL HIGH (ref 0.0–0.1)
Basophils Relative: 1 %
Eosinophils Absolute: 0.4 10*3/uL (ref 0.0–0.5)
Eosinophils Relative: 2 %
HCT: 37.2 % (ref 36.0–46.0)
Hemoglobin: 12.3 g/dL (ref 12.0–15.0)
Immature Granulocytes: 1 %
Lymphocytes Relative: 10 %
Lymphs Abs: 1.9 10*3/uL (ref 0.7–4.0)
MCH: 29.5 pg (ref 26.0–34.0)
MCHC: 33.1 g/dL (ref 30.0–36.0)
MCV: 89.2 fL (ref 80.0–100.0)
Monocytes Absolute: 1.3 10*3/uL — ABNORMAL HIGH (ref 0.1–1.0)
Monocytes Relative: 7 %
Neutro Abs: 15.2 10*3/uL — ABNORMAL HIGH (ref 1.7–7.7)
Neutrophils Relative %: 79 %
Platelets: 399 10*3/uL (ref 150–400)
RBC: 4.17 MIL/uL (ref 3.87–5.11)
RDW: 14.7 % (ref 11.5–15.5)
WBC: 19.2 10*3/uL — ABNORMAL HIGH (ref 4.0–10.5)
nRBC: 0 % (ref 0.0–0.2)

## 2023-04-16 MED ORDER — LABETALOL HCL 5 MG/ML IV SOLN
20.0000 mg | Freq: Once | INTRAVENOUS | Status: AC
  Administered 2023-04-16: 20 mg via INTRAVENOUS
  Filled 2023-04-16: qty 4

## 2023-04-16 MED ORDER — DROPERIDOL 2.5 MG/ML IJ SOLN
2.5000 mg | Freq: Once | INTRAMUSCULAR | Status: AC
  Administered 2023-04-16: 2.5 mg via INTRAVENOUS
  Filled 2023-04-16: qty 2

## 2023-04-16 MED ORDER — SODIUM CHLORIDE 0.9 % IV SOLN
12.5000 mg | Freq: Once | INTRAVENOUS | Status: AC
  Administered 2023-04-16: 12.5 mg via INTRAVENOUS
  Filled 2023-04-16: qty 12.5

## 2023-04-16 MED ORDER — IOHEXOL 300 MG/ML  SOLN
100.0000 mL | Freq: Once | INTRAMUSCULAR | Status: AC | PRN
  Administered 2023-04-16: 100 mL via INTRAVENOUS

## 2023-04-16 MED ORDER — PROMETHAZINE HCL 25 MG PO TABS: 25.0000 mg | ORAL_TABLET | Freq: Four times a day (QID) | ORAL | 0 refills | Status: DC | PRN

## 2023-04-16 MED ORDER — TIZANIDINE HCL 4 MG PO TABS
8.0000 mg | ORAL_TABLET | Freq: Once | ORAL | Status: AC
  Administered 2023-04-16: 8 mg via ORAL
  Filled 2023-04-16: qty 2

## 2023-04-16 MED ORDER — METOCLOPRAMIDE HCL 5 MG/ML IJ SOLN: 10.0000 mg | Freq: Once | INTRAMUSCULAR | Status: DC

## 2023-04-16 NOTE — ED Triage Notes (Signed)
Pt reports RLQ abdominal pain and emesis that began 6 hours ago. Pt HTN in triage and reports she hasn't been able to take meds.

## 2023-04-16 NOTE — Discharge Instructions (Signed)
At home, you can take Phenergan as needed for nausea.  Your CT scan that was done today was normal.  Please follow-up with your primary care doctor within 1 week to discuss these symptoms.

## 2023-04-16 NOTE — ED Provider Notes (Signed)
[place] EMERGENCY DEPARTMENT AT [facility] Provider Note  CSN: [id number] Arrival date & time: 04/16/23 2119  Chief Complaint(s) Abdominal Pain and Emesis  HPI [name] is a 52 y.o. female here today for abdominal pain.  Patient has a history of chronic abdominal pain, says this feels worse than usual.  Symptoms again about 6 hours ago.   Past Medical History Past Medical History:  Diagnosis Date   Anxiety    Arthritis    "joints ache all over" (10/15/2014)   Barrett's esophagus    Bulging lumbar disc    Cardiac arrest (HCC)    Chronic lower back pain    Coronary artery disease    DDD (degenerative disc disease), cervical    Depression    DM (diabetes mellitus) (HCC)    Drug-seeking behavior    Headache    "weekly" (10/15/2014)   Hyperlipemia    Hypertension    PTSD (post-traumatic stress disorder)    Skin cancer    "had them cut off my arms; don't know what kind"   Patient Active Problem List   Diagnosis Date Noted   Positive urine drug screen 07/19/2022   Cyclic vomiting syndrome 07/18/2022   borderline prolonged QT interval 07/18/2022   Dysuria 07/18/2022   Mixed diabetic hyperlipidemia associated with type 2 diabetes mellitus (HCC) 06/27/2022   Compression fracture of fourth lumbar vertebra (HCC) 06/25/2022   RLQ abdominal pain 05/23/2022   Cyclical vomiting with nausea 05/21/2022   Colitis 03/22/2022   Acute colitis 03/22/2022   Dehydration 03/22/2022   Multiple rib fractures 02/03/2022   Barrett's esophagus    Cyclical vomiting syndrome 09/18/2021   Chronic pain syndrome 09/18/2021   Class 2 obesity due to excess calories with body mass index (BMI) of 37.0 to 37.9 in adult 09/18/2021   Hypothyroidism 09/18/2021   Starvation ketoacidosis 08/27/2021   Viral gastroenteritis 08/27/2021   Intractable vomiting 08/02/2021   Aortic atherosclerosis (HCC) 08/02/2021   LFT elevation 08/02/2021   Hyperbilirubinemia 08/02/2021    Hypoalbuminemia 08/02/2021   Type 2 diabetes mellitus without complication, without long-term current use of insulin (HCC) 08/02/2021   Anoxic brain damage (HCC) 08/16/2020   History of depression    Sleep disturbance    Anxiety state    N&V (nausea and vomiting)    Substance abuse (HCC)    Anemia of chronic disease    Acute metabolic encephalopathy 06/23/2020   Hematochezia    Acute blood loss anemia    Acute respiratory failure with hypoxia (HCC)    Non-traumatic rhabdomyolysis    Cardiac arrest (HCC) 06/08/2020   Fracture of ankle, bimalleolar, right, closed 03/28/2020   Iron deficiency 05/05/2019   Symptomatic anemia 05/05/2019   Nausea & vomiting 12/26/2018   Essential hypertension 12/26/2018   ARF (acute renal failure) (HCC) 12/26/2018   Intractable nausea and vomiting 11/28/2018   Bipolar affective disorder (HCC) 11/28/2018   Gastroenteritis 11/19/2015   Hypomagnesemia    C. difficile colitis    SIRS (systemic inflammatory response syndrome) (HCC) 01/11/2015   Cellulitis 01/03/2015   Opiate withdrawal (HCC) 12/18/2014   Depression with anxiety 12/17/2014   Herpes simplex virus type 1 (HSV-1) dermatitis    Sacral fracture (HCC) 12/12/2014   Fall at home, initial encounter 12/12/2014   Chronic lower back pain 12/12/2014   Sacral fracture, closed (HCC) 12/12/2014   Nausea with vomiting    Intractable abdominal pain 10/15/2014   Abdominal pain 10/15/2014   Hypokalemia 09/04/2014   Sepsis (HCC)  09/01/2014   Abdominal pain, generalized 09/01/2014   PTSD (post-traumatic stress disorder) 09/01/2014   Endometriosis 09/01/2014   Inappropriate sinus tachycardia (HCC) 09/01/2014   Lactic acidosis 09/01/2014   Sepsis secondary to UTI (HCC) 09/01/2014   Home Medication(s) Prior to Admission medications   Medication Sig Start Date End Date Taking? Authorizing Provider  promethazine (PHENERGAN) 25 MG tablet Take 1 tablet (25 mg total) by mouth every 6 (six) hours as needed  for nausea or vomiting. 04/16/23  Yes Anders Simmonds T, DO  acetaminophen (TYLENOL) 650 MG CR tablet Take 1,300 mg by mouth as needed for pain.    [provider]  ARIPiprazole (ABILIFY) 5 MG tablet Take 1 tablet (5 mg total) by mouth daily. 08/01/20   Marcello Fennel, MD  atorvastatin (LIPITOR) 10 MG tablet Take 10 mg by mouth every evening. 08/26/20   [provider]  dicyclomine (BENTYL) 20 MG tablet Take 1 tablet (20 mg total) by mouth 2 (two) times daily for 5 days. 07/27/22 08/01/22  Sloan Leiter, DO  FLUoxetine (PROZAC) 40 MG capsule Take 1 capsule (40 mg total) by mouth daily. 07/01/20   Angiulli, Mcarthur Rossetti, PA-C  ibuprofen (ADVIL) 200 MG tablet Take 400 mg by mouth 3 (three) times daily as needed for moderate pain.    [provider]  levothyroxine (SYNTHROID) 50 MCG tablet Take 50 mcg by mouth daily before breakfast.    [provider]  lidocaine (LIDODERM) 5 % Place 1 patch onto the skin daily. Remove & Discard patch within 12 hours or as directed by MD Patient taking differently: Place 1 patch onto the skin as needed (pain). 07/01/22   Shalhoub, Deno Lunger, MD  LIDOCAINE EX Apply 1 Application topically as needed (pain). Cream    [provider]  methocarbamol (ROBAXIN) 500 MG tablet Take 1 tablet (500 mg total) by mouth 2 (two) times daily. 08/24/22   Darrick Grinder, PA-C  metoCLOPramide (REGLAN) 5 MG tablet Take 5 mg by mouth 2 (two) times daily. 07/10/22   [provider]  metoprolol succinate (TOPROL-XL) 25 MG 24 hr tablet Take 1 tablet (25 mg total) by mouth daily. Follow up with your PCP regarding your heart rate and whether this should be continued. Patient taking differently: Take 25 mg by mouth every evening. 07/14/22 08/13/22  Zigmund Daniel., MD  multivitamin (RENA-VIT) TABS tablet Take 1 tablet by mouth at bedtime. 08/01/20   Marcello Fennel, MD  ondansetron (ZOFRAN) 4 MG tablet Take 1 tablet (4 mg total) by mouth every 6  (six) hours. 08/04/22   Gareth Eagle, PA-C  ondansetron (ZOFRAN-ODT) 8 MG disintegrating tablet Take 1 tablet (8 mg total) by mouth every 8 (eight) hours as needed for nausea or vomiting. Patient taking differently: Take 8 mg by mouth as needed for nausea or vomiting. 05/03/22   Tressia Danas, MD  oxyCODONE-acetaminophen (PERCOCET) 7.5-325 MG tablet Take 1 tablet by mouth 4 (four) times daily.    [provider]  pantoprazole (PROTONIX) 40 MG tablet Take 1 tablet by mouth twice daily 09/21/22   Meredith Pel, NP  potassium chloride SA (KLOR-CON M) 20 MEQ tablet Take 1 tablet (20 mEq total) by mouth daily for 3 days. 07/27/22 07/30/22  Sloan Leiter, DO  prazosin (MINIPRESS) 2 MG capsule Take 1 capsule (2 mg total) by mouth at bedtime. 08/01/20   Marcello Fennel, MD  sucralfate (CARAFATE) 1 g tablet Take 1 tablet (1 g total) by  mouth with breakfast, with lunch, and with evening meal for 7 days. 07/27/22 08/03/22  Sloan Leiter, DO  tiZANidine (ZANAFLEX) 4 MG tablet Take 3 tablets (12 mg total) by mouth every 8 (eight) hours as needed. 12/19/22   Netta Corrigan, PA-C  traZODone (DESYREL) 150 MG tablet Take 150 mg by mouth at bedtime.    [provider]                                                                                                                                    Past Surgical History Past Surgical History:  Procedure Laterality Date   ABLATION ON ENDOMETRIOSIS  2008   BIOPSY  12/27/2018   Procedure: BIOPSY;  Surgeon: Tressia Danas, MD;  Location: [facility] END[facility];  Service: Gastroenterology;;   BIOPSY  10/05/2021   Procedure: BIOPSY;  Surgeon: Iva Boop, MD;  Location: [facility] ENDOSCOPY;  Service: Gastroenterology;;   ESOPHAGOGASTRODUODENOSCOPY (EGD) WITH PROPOFOL N/A 12/27/2018   Procedure: ESOPHAGOGASTRODUODENOSCOPY (EGD) WITH PROPOFOL;  Surgeon: Tressia Danas, MD;  Location: [facility]OSCOPY;  Service: Gastroenterology;  Laterality: N/A;    ESOPHAGOGASTRODUODENOSCOPY (EGD) WITH PROPOFOL N/A 10/05/2021   Procedure: ESOPHAGOGASTRODUODENOSCOPY (EGD) WITH PROPOFOL;  Surgeon: Iva Boop, MD;  Location: [facility] ENDOSCOPY;  Service: Gastroenterology;  Laterality: N/A;   HEMORRHOID SURGERY  ~ 2002   IR FLUORO GUIDE CV LINE RIGHT  06/14/2020   IR REMOVAL TUN CV CATH W/O FL  06/23/2020   IR US GUIDE VASC ACCESS RIGHT  06/14/2020   ORIF ANKLE FRACTURE Right 03/28/2020   Procedure: OPEN REDUCTION INTERNAL FIXATION (ORIF) RIGHT BIMALLEOLAR ANKLE FRACTURE;  Surgeon: Teryl Lucy, MD;  Location: [facility];  Service: Orthopedics;  Laterality: Right;   Family History Family History  Problem Relation Age of Onset   Breast cancer Mother    Diabetes Mother    Breast cancer Maternal Grandmother    Breast cancer Paternal Grandmother    Colon polyps Paternal Grandmother    Colon cancer Neg Hx    Esophageal cancer Neg Hx    Rectal cancer Neg Hx    Stomach cancer Neg Hx     Social History Social History   Tobacco Use   Smoking status: Never   Smokeless tobacco: Never  Vaping Use   Vaping status: Never Used  Substance Use Topics   Alcohol use: Not Currently    Comment: 10/15/2014 "I've drank before; nothing regular; don't drink now cause of RX I'm on"   Drug use: No   Allergies Patient has no known allergies.  Review of Systems Review of Systems  Physical Exam Vital Signs  I have reviewed the triage vital signs BP (!) 182/111   Pulse (!) 113   Temp 99.9 F (37.7 C) (Oral)   Resp 16   Ht 5\' 4"  (1.626 m)   Wt 90.7 kg   LMP 06/03/2018 (Approximate) Comment: neg hcg 05/10/20  SpO2 95%  BMI 34.33 kg/m   Physical Exam Vitals reviewed.  HENT:     Head: Normocephalic.  Cardiovascular:     Rate and Rhythm: Tachycardia present.     Heart sounds: Normal heart sounds.  Pulmonary:     Effort: Pulmonary effort is normal.  Abdominal:     General: Abdomen is flat. There is no distension.     Palpations: Abdomen is  soft.     Tenderness: There is no abdominal tenderness.  Skin:    General: Skin is warm and dry.  Neurological:     Mental Status: She is alert.     ED Results and Treatments Labs (all labs ordered are listed, but only abnormal results are displayed) Labs Reviewed  COMPREHENSIVE METABOLIC PANEL - Abnormal; Notable for the following components:      Result Value   Sodium 130 (*)    Chloride 95 (*)    Glucose, Bld 158 (*)    All other components within normal limits  CBC WITH DIFFERENTIAL/PLATELET - Abnormal; Notable for the following components:   WBC 19.2 (*)    Neutro Abs 15.2 (*)    Monocytes Absolute 1.3 (*)    Basophils Absolute 0.2 (*)    Abs Immature Granulocytes 0.24 (*)    All other components within normal limits                                                                                                                          Radiology CT ABDOMEN PELVIS W CONTRAST Result Date: 04/16/2023 CLINICAL DATA:  Right lower quadrant pain. EXAM: CT ABDOMEN AND PELVIS WITH CONTRAST TECHNIQUE: Multidetector CT imaging of the abdomen and pelvis was performed using the standard protocol following bolus administration of intravenous contrast. RADIATION DOSE REDUCTION: This exam was performed according to the departmental dose-optimization program which includes automated exposure control, adjustment of the mA and/or kV according to patient size and/or use of iterative reconstruction technique. CONTRAST:  OMNIPAQUE IOHEXOL 300 MG/ML  SOLN COMPARISON:  November 24, 2022 FINDINGS: Lower chest: Mild linear atelectasis is seen within the right lung base. Hepatobiliary: There is diffuse fatty infiltration of the liver parenchyma. No focal liver abnormality is seen. No gallstones, gallbladder wall thickening, or biliary dilatation. Pancreas: Unremarkable. No pancreatic ductal dilatation or surrounding inflammatory changes. Spleen: Normal in size without focal abnormality. Adrenals/Urinary  Tract: Adrenal glands are unremarkable. Kidneys are normal, without renal calculi, focal lesion, or hydronephrosis. Bladder is unremarkable. Stomach/Bowel: Stomach is within normal limits. Appendix appears normal. No evidence of bowel wall thickening, distention, or inflammatory changes. Vascular/Lymphatic: Aortic atherosclerosis. No enlarged abdominal or pelvic lymph nodes. Reproductive: Uterus and bilateral adnexa are unremarkable. Other: No abdominal wall hernia or abnormality. No abdominopelvic ascites. Musculoskeletal: Chronic fracture deformities are seen involving the superior endplates of the L3 and L4 vertebral bodies. IMPRESSION: 1. Hepatic steatosis. 2. Chronic fracture deformities involving the superior endplates of the L3 and L4 vertebral bodies. 3. Aortic atherosclerosis. Aortic Atherosclerosis (ICD10-I70.0). Electronically  Signed   By: Aram Candela M.D.   On: 04/16/2023 22:51    Pertinent labs & imaging results that were available during my care of the patient were reviewed by me and considered in my medical decision making (see MDM for details).  Medications Ordered in ED Medications  tiZANidine (ZANAFLEX) tablet 8 mg (has no administration in time range)  promethazine (PHENERGAN) 12.5 mg in sodium chloride 0.9 % 50 mL IVPB (has no administration in time range)  labetalol (NORMODYNE) injection 20 mg (20 mg Intravenous Given 04/16/23 2219)  droperidol (INAPSINE) 2.5 MG/ML injection 2.5 mg (2.5 mg Intravenous Given 04/16/23 2218)  iohexol (OMNIPAQUE) 300 MG/ML solution 100 mL (100 mLs Intravenous Contrast Given 04/16/23 2229)                                                                                                                                     Procedures Procedures  (including critical care time)  Medical Decision Making / ED Course   This patient presents to the ED for concern of abdominal pain, this involves an extensive number of treatment options, and is a  complaint that carries with it a high risk of complications and morbidity.  The differential diagnosis includes chronic abdominal pain, intra-abdominal infection, less likely obstruction.  MDM: Patient overall looks well.  She has been unable to take her home medications which include beta-blockers, she believes likely responsible for her abnormal vital signs.  Will obtain imaging of the patient's abdomen pelvis.  Labs ordered.  Will try to symptomatically treat the patient.  Abdomen overall soft.  Reassessment 11 PM-patient CT imaging of the abdomen pelvis negative.  Patient requesting tizanidine, says that she takes this is a home medication.  Have ordered this for the patient.  Like to the patient's labs, she does have elevated white count.  Looking through her previous visits, this is not uncommon for the patient.  Patient afebrile.  Heart rate is come down well with treatment.  Droperidol had minor effect on the patient, will provide her with some Reglan.  Will reassess patient following second round of medications.  Considered additional sources of infection in this patient.  She does not have any cough or shortness of breath.  Lung sounds are clear.  She denies any dysuria or increased urinary frequency.  Believe that this is a reactive leukocytosis.   Additional history obtained: -Additional history obtained from  -External records from outside source obtained and reviewed including: Chart review including previous notes, labs, imaging, consultation notes   Lab Tests: -I ordered, reviewed, and interpreted labs.   The pertinent results include:   Labs Reviewed  COMPREHENSIVE METABOLIC PANEL - Abnormal; Notable for the following components:      Result Value   Sodium 130 (*)    Chloride 95 (*)    Glucose, Bld 158 (*)    All other components within normal limits  CBC WITH  DIFFERENTIAL/PLATELET - Abnormal; Notable for the following components:   WBC 19.2 (*)    Neutro Abs 15.2 (*)     Monocytes Absolute 1.3 (*)    Basophils Absolute 0.2 (*)    Abs Immature Granulocytes 0.24 (*)    All other components within normal limits      EKG   EKG Interpretation Date/Time:    Ventricular Rate:    PR Interval:    QRS Duration:    QT Interval:    QTC Calculation:   R Axis:      Text Interpretation:           Imaging Studies ordered: I ordered imaging studies including CT imaging of the abdomen and pelvis I independently visualized and interpreted imaging. I agree with the radiologist interpretation   Medicines ordered and prescription drug management: Meds ordered this encounter  Medications   labetalol (NORMODYNE) injection 20 mg   droperidol (INAPSINE) 2.5 MG/ML injection 2.5 mg   iohexol (OMNIPAQUE) 300 MG/ML solution 100 mL   tiZANidine (ZANAFLEX) tablet 8 mg   DISCONTD: metoCLOPramide (REGLAN) injection 10 mg   promethazine (PHENERGAN) 25 MG tablet    Sig: Take 1 tablet (25 mg total) by mouth every 6 (six) hours as needed for nausea or vomiting.    Dispense:  30 tablet    Refill:  0   promethazine (PHENERGAN) 12.5 mg in sodium chloride 0.9 % 50 mL IVPB    -I have reviewed the patients home medicines and have made adjustments as needed  Cardiac Monitoring: The patient was maintained on a cardiac monitor.  I personally viewed and interpreted the cardiac monitored which showed an underlying rhythm of: Sinus rhythm  Social Determinants of Health:  Factors impacting patients care include: Limited access to primary care   Reevaluation: After the interventions noted above, I reevaluated the patient and found that they have :improved  Co morbidities that complicate the patient evaluation  Past Medical History:  Diagnosis Date   Anxiety    Arthritis    "joints ache all over" (10/15/2014)   Barrett's esophagus    Bulging lumbar disc    Cardiac arrest (HCC)    Chronic lower back pain    Coronary artery disease    DDD (degenerative disc disease),  cervical    Depression    DM (diabetes mellitus) (HCC)    Drug-seeking behavior    Headache    "weekly" (10/15/2014)   Hyperlipemia    Hypertension    PTSD (post-traumatic stress disorder)    Skin cancer    "had them cut off my arms; don't know what kind"      Dispostion: I considered admission for this patient, however patient improved with symptomatic management, was no longer vomiting and could take her home medications.     Final Clinical Impression(s) / ED Diagnoses Final diagnoses:  Cyclical vomiting syndrome not associated with migraine     @PCDICTATION @    Anders Simmonds T, DO 04/16/23 2322

## 2023-06-24 ENCOUNTER — Other Ambulatory Visit (HOSPITAL_COMMUNITY): Payer: Self-pay

## 2023-07-08 ENCOUNTER — Emergency Department (HOSPITAL_COMMUNITY)

## 2023-07-08 ENCOUNTER — Encounter (HOSPITAL_COMMUNITY): Payer: Self-pay

## 2023-07-08 ENCOUNTER — Other Ambulatory Visit: Payer: Self-pay

## 2023-07-08 ENCOUNTER — Emergency Department (HOSPITAL_COMMUNITY)
Admission: EM | Admit: 2023-07-08 | Discharge: 2023-07-08 | Disposition: A | Discharge status: Home / Self Care | Attending: Emergency Medicine | Admitting: Emergency Medicine

## 2023-07-08 DIAGNOSIS — Z79899 Other long term (current) drug therapy: Secondary | ICD-10-CM | POA: Diagnosis not present

## 2023-07-08 DIAGNOSIS — E1165 Type 2 diabetes mellitus with hyperglycemia: Secondary | ICD-10-CM | POA: Diagnosis not present

## 2023-07-08 DIAGNOSIS — D72829 Elevated white blood cell count, unspecified: Secondary | ICD-10-CM | POA: Diagnosis not present

## 2023-07-08 DIAGNOSIS — R1115 Cyclical vomiting syndrome unrelated to migraine: Secondary | ICD-10-CM | POA: Diagnosis not present

## 2023-07-08 DIAGNOSIS — R1110 Vomiting, unspecified: Secondary | ICD-10-CM | POA: Diagnosis present

## 2023-07-08 LAB — COMPREHENSIVE METABOLIC PANEL
ALT: 27 U/L (ref 0–44)
AST: 17 U/L (ref 15–41)
Albumin: 3.9 g/dL (ref 3.5–5.0)
Alkaline Phosphatase: 127 U/L — ABNORMAL HIGH (ref 38–126)
Anion gap: 14 (ref 5–15)
BUN: 12 mg/dL (ref 6–20)
CO2: 24 mmol/L (ref 22–32)
Calcium: 9.7 mg/dL (ref 8.9–10.3)
Chloride: 98 mmol/L (ref 98–111)
Creatinine, Ser: 0.76 mg/dL (ref 0.44–1.00)
GFR, Estimated: >60 mL/min (ref >60)
Glucose, Bld: 433 mg/dL — ABNORMAL HIGH (ref 70–99)
Potassium: 3.5 mmol/L (ref 3.5–5.1)
Sodium: 136 mmol/L (ref 135–145)
Total Bilirubin: 1.1 mg/dL (ref 0.0–1.2)
Total Protein: 8.6 g/dL — ABNORMAL HIGH (ref 6.5–8.1)

## 2023-07-08 LAB — RESP PANEL BY RT-PCR (RSV, FLU A&B, COVID)  RVPGX2
Influenza A by PCR: NEGATIVE
Influenza B by PCR: NEGATIVE
Resp Syncytial Virus by PCR: NEGATIVE
SARS Coronavirus 2 by RT PCR: NEGATIVE

## 2023-07-08 LAB — CBC
HCT: 43.1 % (ref 36.0–46.0)
Hemoglobin: 13.8 g/dL (ref 12.0–15.0)
MCH: 27.9 pg (ref 26.0–34.0)
MCHC: 32 g/dL (ref 30.0–36.0)
MCV: 87.2 fL (ref 80.0–100.0)
Platelets: 445 10*3/uL — ABNORMAL HIGH (ref 150–400)
RBC: 4.94 MIL/uL (ref 3.87–5.11)
RDW: 14.3 % (ref 11.5–15.5)
WBC: 15.2 10*3/uL — ABNORMAL HIGH (ref 4.0–10.5)
nRBC: 0 % (ref 0.0–0.2)

## 2023-07-08 LAB — URINALYSIS, ROUTINE W REFLEX MICROSCOPIC
Bacteria, UA: NONE SEEN
Bilirubin Urine: NEGATIVE
Glucose, UA: >=500 mg/dL — AB
Hgb urine dipstick: NEGATIVE
Ketones, ur: 20 mg/dL — AB
Leukocytes,Ua: NEGATIVE
Nitrite: NEGATIVE
Protein, ur: NEGATIVE mg/dL
Specific Gravity, Urine: 1.043 — ABNORMAL HIGH (ref 1.005–1.030)
pH: 5 (ref 5.0–8.0)

## 2023-07-08 LAB — MAGNESIUM: Magnesium: 1.9 mg/dL (ref 1.7–2.4)

## 2023-07-08 LAB — I-STAT CG4 LACTIC ACID, ED: Lactic Acid, Venous: 1.5 mmol/L (ref 0.5–1.9)

## 2023-07-08 LAB — LIPASE, BLOOD: Lipase: 21 U/L (ref 11–51)

## 2023-07-08 MED ORDER — ONDANSETRON 4 MG PO TBDP: 4.0000 mg | ORAL_TABLET | Freq: Three times a day (TID) | ORAL | 0 refills | Status: DC | PRN

## 2023-07-08 MED ORDER — SODIUM CHLORIDE 0.9 % IV BOLUS
1000.0000 mL | Freq: Once | INTRAVENOUS | Status: AC
  Administered 2023-07-08: 1000 mL via INTRAVENOUS

## 2023-07-08 MED ORDER — PANTOPRAZOLE SODIUM 40 MG IV SOLR
40.0000 mg | Freq: Once | INTRAVENOUS | Status: AC
  Administered 2023-07-08: 40 mg via INTRAVENOUS
  Filled 2023-07-08: qty 10

## 2023-07-08 MED ORDER — IOHEXOL 300 MG/ML  SOLN
100.0000 mL | Freq: Once | INTRAMUSCULAR | Status: AC | PRN
  Administered 2023-07-08: 100 mL via INTRAVENOUS

## 2023-07-08 MED ORDER — MORPHINE SULFATE (PF) 4 MG/ML IV SOLN
4.0000 mg | Freq: Once | INTRAVENOUS | Status: AC
  Administered 2023-07-08: 4 mg via INTRAVENOUS
  Filled 2023-07-08: qty 1

## 2023-07-08 MED ORDER — DROPERIDOL 2.5 MG/ML IJ SOLN
2.5000 mg | Freq: Once | INTRAMUSCULAR | Status: AC
  Administered 2023-07-08: 2.5 mg via INTRAVENOUS
  Filled 2023-07-08: qty 2

## 2023-07-08 NOTE — Discharge Instructions (Addendum)
 Your work-up in the ER was reassuring for acute findings. However your blood sugar was quite high and you need to follow up in the next few days with your pcp to have this further evaluated and to discuss management of this moving forward. Please follow-up with your primary care provider regards recent symptoms and ER visit.  Today your labs imaging are reassuring and you improved medications here.  Please speak with your primary care provider about diabetic medication as your sugar was high today.  If symptoms change or worsen please return to the ER.

## 2023-07-08 NOTE — ED Provider Notes (Signed)
 [place] EMERGENCY DEPARTMENT AT [facility] Provider Note   CSN: [id number] Arrival date & time: 07/08/23  0024     History  Chief Complaint  Patient presents with   Emesis    [name] is a 53 y.o. female history of chronic abdominal pain, cyclic vomiting, bipolar, diabetes presented for nausea vomiting and abdominal pain that began earlier this evening.  Patient had to have it acutely.  Patient that she took her home oxycodone and Phenergan however this did not help.  Patient states that she has been compliant with her medications and denies any fevers chest pain shortness of breath.  Patient denies hematochezia, hematemesis, melena.  Patient denies dysuria.  Patient states this feels similar to previous episodes of vomiting.  Home Medications Prior to Admission medications   Medication Sig Start Date End Date Taking? Authorizing Provider  acetaminophen (TYLENOL) 650 MG CR tablet Take 1,300 mg by mouth as needed for pain.    [provider]  ARIPiprazole (ABILIFY) 5 MG tablet Take 1 tablet (5 mg total) by mouth daily. 08/01/20   Marcello Fennel, MD  atorvastatin (LIPITOR) 10 MG tablet Take 10 mg by mouth every evening. 08/26/20   [provider]  dicyclomine (BENTYL) 20 MG tablet Take 1 tablet (20 mg total) by mouth 2 (two) times daily for 5 days. 07/27/22 08/01/22  Sloan Leiter, DO  FLUoxetine (PROZAC) 40 MG capsule Take 1 capsule (40 mg total) by mouth daily. 07/01/20   Angiulli, Mcarthur Rossetti, PA-C  ibuprofen (ADVIL) 200 MG tablet Take 400 mg by mouth 3 (three) times daily as needed for moderate pain.    [provider]  levothyroxine (SYNTHROID) 50 MCG tablet Take 50 mcg by mouth daily before breakfast.    [provider]  lidocaine (LIDODERM) 5 % Place 1 patch onto the skin daily. Remove & Discard patch within 12 hours or as directed by MD Patient taking differently: Place 1 patch onto the skin as needed (pain). 07/01/22   Shalhoub,  Deno Lunger, MD  LIDOCAINE EX Apply 1 Application topically as needed (pain). Cream    [provider]  methocarbamol (ROBAXIN) 500 MG tablet Take 1 tablet (500 mg total) by mouth 2 (two) times daily. 08/24/22   Darrick Grinder, PA-C  metoCLOPramide (REGLAN) 5 MG tablet Take 5 mg by mouth 2 (two) times daily. 07/10/22   [provider]  metoprolol succinate (TOPROL-XL) 25 MG 24 hr tablet Take 1 tablet (25 mg total) by mouth daily. Follow up with your PCP regarding your heart rate and whether this should be continued. Patient taking differently: Take 25 mg by mouth every evening. 07/14/22 08/13/22  Zigmund Daniel., MD  multivitamin (RENA-VIT) TABS tablet Take 1 tablet by mouth at bedtime. 08/01/20   Marcello Fennel, MD  ondansetron (ZOFRAN) 4 MG tablet Take 1 tablet (4 mg total) by mouth every 6 (six) hours. 08/04/22   Gareth Eagle, PA-C  ondansetron (ZOFRAN-ODT) 8 MG disintegrating tablet Take 1 tablet (8 mg total) by mouth every 8 (eight) hours as needed for nausea or vomiting. Patient taking differently: Take 8 mg by mouth as needed for nausea or vomiting. 05/03/22   Tressia Danas, MD  oxyCODONE-acetaminophen (PERCOCET) 7.5-325 MG tablet Take 1 tablet by mouth 4 (four) times daily.    [provider]  pantoprazole (PROTONIX) 40 MG tablet Take 1 tablet by mouth twice daily 09/21/22   Meredith Pel, NP  potassium chloride SA (  KLOR-CON M) 20 MEQ tablet Take 1 tablet (20 mEq total) by mouth daily for 3 days. 07/27/22 07/30/22  Sloan Leiter, DO  prazosin (MINIPRESS) 2 MG capsule Take 1 capsule (2 mg total) by mouth at bedtime. 08/01/20   Marcello Fennel, MD  promethazine (PHENERGAN) 25 MG tablet Take 1 tablet (25 mg total) by mouth every 6 (six) hours as needed for nausea or vomiting. 04/16/23   Arletha Pili, DO  sucralfate (CARAFATE) 1 g tablet Take 1 tablet (1 g total) by mouth with breakfast, with lunch, and with evening meal for 7 days. 07/27/22 08/03/22  Sloan Leiter, DO  tiZANidine (ZANAFLEX) 4 MG tablet Take 3 tablets (12 mg total) by mouth every 8 (eight) hours as needed. 12/19/22   Netta Corrigan, PA-C  traZODone (DESYREL) 150 MG tablet Take 150 mg by mouth at bedtime.    [provider]      Allergies    Patient has no known allergies.    Review of Systems   Review of Systems  Gastrointestinal:  Positive for vomiting.    Physical Exam Updated Vital Signs BP (!) 128/108 (BP Location: Right Arm)   Pulse (!) 118   Temp 99.2 F (37.3 C) (Oral)   Resp 18   Ht 5\' 4"  (1.626 m)   Wt 99.8 kg   LMP 06/03/2018 (Approximate) Comment: neg hcg 05/10/20  SpO2 96%   BMI 37.76 kg/m  Physical Exam Vitals reviewed.  Constitutional:      General: She is not in acute distress. HENT:     Head: Normocephalic and atraumatic.  Eyes:     Extraocular Movements: Extraocular movements intact.     Conjunctiva/sclera: Conjunctivae normal.     Pupils: Pupils are equal, round, and reactive to light.  Cardiovascular:     Rate and Rhythm: Regular rhythm. Tachycardia present.     Pulses: Normal pulses.     Heart sounds: Normal heart sounds.     Comments: 2+ bilateral radial/dorsalis pedis pulses with regular rate Pulmonary:     Effort: Pulmonary effort is normal. No respiratory distress.     Breath sounds: Normal breath sounds.  Abdominal:     Palpations: Abdomen is soft.     Tenderness: There is no abdominal tenderness. There is no guarding or rebound.  Musculoskeletal:        General: Normal range of motion.     Cervical back: Normal range of motion and neck supple.     Comments: 5 out of 5 bilateral grip/leg extension strength  Skin:    General: Skin is warm and dry.     Capillary Refill: Capillary refill takes less than 2 seconds.  Neurological:     General: No focal deficit present.     Mental Status: She is alert and oriented to person, place, and time.     Comments: Sensation intact in all 4 limbs  Psychiatric:        Mood and  Affect: Mood normal.    ED Results / Procedures / Treatments   Labs (all labs ordered are listed, but only abnormal results are displayed) Labs Reviewed  COMPREHENSIVE METABOLIC PANEL - Abnormal; Notable for the following components:      Result Value   Glucose, Bld 433 (*)    Total Protein 8.6 (*)    Alkaline Phosphatase 127 (*)    All other components within normal limits  CBC - Abnormal; Notable for the following components:   WBC 15.2 (*)  Platelets 445 (*)    All other components within normal limits  RESP PANEL BY RT-PCR (RSV, FLU A&B, COVID)  RVPGX2  LIPASE, BLOOD  MAGNESIUM  URINALYSIS, ROUTINE W REFLEX MICROSCOPIC    EKG None  Radiology No results found.  Procedures Procedures    Medications Ordered in ED Medications  sodium chloride 0.9 % bolus 1,000 mL (1,000 mLs Intravenous New Bag/Given 07/08/23 0240)  droperidol (INAPSINE) 2.5 MG/ML injection 2.5 mg (2.5 mg Intravenous Given 07/08/23 0240)    ED Course/ Medical Decision Making/ A&P                                 Medical Decision Making Amount and/or Complexity of Data Reviewed Labs: ordered. Radiology: ordered.  Risk Prescription drug management.   [name] 53 y.o. presented today for abdominal pain.  Working DDx that I considered at this time includes, but not limited to, cyclic vomiting, gastroenteritis, colitis, small bowel obstruction, appendicitis, cholecystitis, hepatobiliary pathology, gastritis, PUD, ACS, aortic dissection, diverticulosis/diverticulitis, pancreatitis, nephrolithiasis, medication induced, AAA, UTI, pyelonephritis, ruptured ectopic pregnancy, PID, ovarian torsion.  R/o DDx: gastroenteritis, colitis, small bowel obstruction, appendicitis, cholecystitis, hepatobiliary pathology, gastritis, PUD, ACS, aortic dissection, diverticulosis/diverticulitis, pancreatitis, nephrolithiasis, medication induced, AAA, UTI, pyelonephritis, ruptured ectopic pregnancy, PID, ovarian  torsion: These are considered less likely due to history of present illness, physical exam, labs/imaging findings.  Review of prior external notes: 03/01/2023 office visit  Unique Tests and My Independent Interpretation:  CBC with differential: Chronic leukocytosis 15.2 CMP: Hyperglycemia 433 Lipase: Unremarkable Lactic acid: 1.5 UA: Pending Magnesium: Unremarkable EKG: Rate, rhythm, axis, intervals all examined and without medically relevant abnormality. ST segments without concerns for elevations CT Abd/Pelvis with contrast: No acute pathology  Social Determinants of Health: none  Discussion with Independent Historian: None  Discussion of Management of Tests: None  Risk: Medium: prescription drug management  Risk Stratification Score: None  Staffed with Madilyn Hook, MD   Plan: On exam patient was no acute distress but noted to be tachycardic on exam.  Patient has soft nontender abdomen without peritoneal signs.  Patient's been seen multiple times in the ED for similar presentations with negative imaging.  Patient had 10 CT scans of her abdomen last year with no acute findings.  I spoke to the patient we agreed to forego CT scan at this time and upon chart review it appears that droperidol worked and after discussion with the patient we agreed to proceed with droperidol.  Will give fluids as well along with basic labs.  Labs do show hyperglycemia without signs of diabetic emergency.  Upon chart review I do not see any medications for patient's diabetes and so we will recommend that she follows up with her primary care provider for long-term management.  Patients are receiving fluids.  Patient does have white count however this has been seen previously and is most likely reactive in the setting of no infectious symptoms and nontender abdomen.  Patient continues to complain about pain and so we will give 1 round of pain meds here however will explain to the patient that it is not standard  practice to do multiple rounds of strong pain meds for chronic abdominal pain and that she will need to follow-up with her primary care provider for this.  Patient's lactic acid was normal along with her CT scan.  Patient's heart rate has come down and patient has not had any episodes of emesis here.  Patient does feel better here with medications.  Awaiting urine sample however if negative do plan on discharge with primary care follow-up as patient has chronic tachycardia upon chart review.  Patient has been successfully p.o. challenged here.  Patient has not had any episodes of emesis during her stay thus far.  Patient signed out to [place], PA-C.  Please review their note for the continuation of patient's care.  The plan at this point is follow-up on UA and discharge.  This chart was dictated using voice recognition software.  Despite best efforts to proofread,  errors can occur which can change the documentation meaning.         Final Clinical Impression(s) / ED Diagnoses Final diagnoses:  None    Rx / DC Orders ED Discharge Orders     None         Remi Deter 07/08/23 [id number]    Tilden Fossa, MD 07/08/23 [phone]

## 2023-07-08 NOTE — ED Triage Notes (Signed)
 Pt. Arrives POV with son for abdominal pain, vomiting and leg pain for 1 day. Took home oxycodone 10mg , and phenergan and has had no relief.

## 2023-07-08 NOTE — ED Notes (Signed)
 Assisted patient to the bathroom to leave urine sample.

## 2023-07-08 NOTE — ED Notes (Signed)
 Patient is resting in the bed sleeping at this time.

## 2023-07-08 NOTE — ED Notes (Signed)
 Patient refused to answer this writers question on how she was feeling. Provider notified. Patient just looked at this writer and closer her eyes back.

## 2023-07-08 NOTE — ED Provider Notes (Signed)
 Care assumed from [facility], PA-C at shift change. Please see their note for further information.   Patient pending UA at shift change then will be discharged. Here for cyclic vomiting, CT benign, symptoms controlled and patient feel well to go home. Did have mild leukocytosis and tachycardia, no urinary symptoms. Glucose 400, no diabetes diagnosis, no signs of DKA. Plan to close outpatient follow-up for management of this.   UA has resulted and is noninfectious. Patient ready for discharge per previous providers recommendations.   Evaluation and diagnostic testing in the emergency department does not suggest an emergent condition requiring admission or immediate intervention beyond what has been performed at this time.  Plan for discharge with close PCP follow-up.  Patient is understanding and amenable with plan, educated on red flag symptoms that would prompt immediate return.  Patient discharged in stable condition.    Silva Bandy, PA-C 07/08/23 Brendolyn Patty, MD 07/08/23 [phone]

## 2023-07-27 ENCOUNTER — Emergency Department (HOSPITAL_COMMUNITY)

## 2023-07-27 ENCOUNTER — Other Ambulatory Visit: Payer: Self-pay

## 2023-07-27 ENCOUNTER — Emergency Department (HOSPITAL_COMMUNITY)
Admission: EM | Admit: 2023-07-27 | Discharge: 2023-07-28 | Disposition: A | Discharge status: Home / Self Care | Attending: Emergency Medicine | Admitting: Emergency Medicine

## 2023-07-27 DIAGNOSIS — Z794 Long term (current) use of insulin: Secondary | ICD-10-CM | POA: Insufficient documentation

## 2023-07-27 DIAGNOSIS — I2510 Atherosclerotic heart disease of native coronary artery without angina pectoris: Secondary | ICD-10-CM | POA: Insufficient documentation

## 2023-07-27 DIAGNOSIS — Y9301 Activity, walking, marching and hiking: Secondary | ICD-10-CM | POA: Insufficient documentation

## 2023-07-27 DIAGNOSIS — S22082A Unstable burst fracture of T11-T12 vertebra, initial encounter for closed fracture: Secondary | ICD-10-CM | POA: Diagnosis not present

## 2023-07-27 DIAGNOSIS — W01198A Fall on same level from slipping, tripping and stumbling with subsequent striking against other object, initial encounter: Secondary | ICD-10-CM | POA: Diagnosis not present

## 2023-07-27 DIAGNOSIS — S29002A Unspecified injury of muscle and tendon of back wall of thorax, initial encounter: Secondary | ICD-10-CM | POA: Diagnosis present

## 2023-07-27 DIAGNOSIS — E119 Type 2 diabetes mellitus without complications: Secondary | ICD-10-CM | POA: Insufficient documentation

## 2023-07-27 DIAGNOSIS — S22081A Stable burst fracture of T11-T12 vertebra, initial encounter for closed fracture: Secondary | ICD-10-CM

## 2023-07-27 MED ORDER — OXYCODONE-ACETAMINOPHEN 5-325 MG PO TABS
1.0000 | ORAL_TABLET | Freq: Once | ORAL | Status: AC
  Administered 2023-07-27: 1 via ORAL
  Filled 2023-07-27: qty 1

## 2023-07-27 MED ORDER — METOCLOPRAMIDE HCL 10 MG PO TABS
10.0000 mg | ORAL_TABLET | Freq: Once | ORAL | Status: AC
  Administered 2023-07-27: 10 mg via ORAL
  Filled 2023-07-27: qty 1

## 2023-07-27 NOTE — ED Notes (Signed)
Patient transported to CT via bed.

## 2023-07-27 NOTE — ED Triage Notes (Signed)
 Patient feel and hit her back. Now she complains of 9/10 back pain. Patient also reports nausea post fall, but she did not hit her head.

## 2023-07-28 ENCOUNTER — Emergency Department (HOSPITAL_COMMUNITY)

## 2023-07-28 MED ORDER — MORPHINE SULFATE (PF) 4 MG/ML IV SOLN
4.0000 mg | Freq: Once | INTRAVENOUS | Status: AC
  Administered 2023-07-28: 4 mg via INTRAMUSCULAR
  Filled 2023-07-28: qty 1

## 2023-07-28 MED ORDER — METOPROLOL SUCCINATE ER 25 MG PO TB24: 25.0000 mg | ORAL_TABLET | Freq: Every day | ORAL | 0 refills | Status: DC

## 2023-07-28 MED ORDER — TIZANIDINE HCL 4 MG PO TABS: 4.0000 mg | ORAL_TABLET | Freq: Four times a day (QID) | ORAL | 0 refills | Status: AC | PRN

## 2023-07-28 NOTE — Progress Notes (Signed)
 Orthopedic Tech Progress Note Patient Details:  [name] [birth year] [id number]  Patient ID: [name], female   DOB: 1971/01/16, 53 y.o.   MRN: [id number] I called order into [facility] for tlso. Trinna Post 07/28/2023, 2:17 AM

## 2023-07-28 NOTE — ED Notes (Signed)
 Ortho contacted in regards to TLSO brace.

## 2023-07-28 NOTE — Discharge Instructions (Addendum)
 As discussed, your imaging shows a fracture at T12. Wear the TLSO back brace every day. Follow up with neurosurgery in the next 2 weeks for reevaluation. Information provided above. Since you have a pain contract with pain management, I cannot prescribe you more medication for the pain.  Get help right away if: Your pain is very bad and it suddenly gets worse. You are unable to move any body part (paralysis) that is below the level of your injury. You have numbness, tingling, or weakness in any body part that is below the level of your injury. You cannot control when you urinate or have bowel movements.

## 2023-07-28 NOTE — ED Notes (Signed)
 Patient transported to CT and returned to room.

## 2023-07-28 NOTE — ED Provider Notes (Signed)
 [place] EMERGENCY DEPARTMENT AT [facility] Provider Note   CSN: [id number] Arrival date & time: 07/27/23  2214     History  Chief Complaint  Patient presents with   Fall   Back Pain    [name] is a 53 y.o. female with a history of diabetes mellitus, coronary artery disease, and chronic low back pain who presents the ED today after a fall.  Patient reports that prior to arrival she was walking and fell backwards, landing on her back.  Denies hitting her head or loss of consciousness.  She is not on blood thinners.  Endorses pain throughout her back.  No weakness, numbness, tingling to the lower extremities.  Denies saddle anesthesia.  No additional complaints or concerns at this time.    Home Medications Prior to Admission medications   Medication Sig Start Date End Date Taking? Authorizing Provider  Baclofen 5 MG TABS Take 1 tablet by mouth 2 (two) times daily. 07/10/23  Yes [provider]  clonazePAM (KLONOPIN) 0.5 MG tablet Take 0.5 mg by mouth daily as needed. 07/19/23  Yes [provider]  DULoxetine (CYMBALTA) 60 MG capsule Take 60 mg by mouth 2 (two) times daily. 06/26/23  Yes [provider]  mirtazapine (REMERON) 15 MG tablet Take 15 mg by mouth at bedtime. 07/09/23  Yes [provider]  MOUNJARO 5 MG/0.5ML Pen Inject 5 mg into the skin once a week. 07/10/23  Yes [provider]  oxyCODONE-acetaminophen (PERCOCET) 10-325 MG tablet Take 1 tablet by mouth 4 (four) times daily. 07/13/23  Yes [provider]  prazosin (MINIPRESS) 5 MG capsule Take 5 mg by mouth at bedtime. 07/19/23  Yes [provider]  pregabalin (LYRICA) 50 MG capsule Take 50 mg by mouth 2 (two) times daily. 07/10/23  Yes [provider]  trazodone (DESYREL) 300 MG tablet Take 300 mg by mouth at bedtime. 06/30/23  Yes [provider]  acetaminophen (TYLENOL) 650 MG CR tablet Take 1,300 mg by mouth as needed for pain.     [provider]  ARIPiprazole (ABILIFY) 5 MG tablet Take 1 tablet (5 mg total) by mouth daily. 08/01/20   Marcello Fennel, MD  atorvastatin (LIPITOR) 10 MG tablet Take 10 mg by mouth every evening. 08/26/20   [provider]  dicyclomine (BENTYL) 20 MG tablet Take 1 tablet (20 mg total) by mouth 2 (two) times daily for 5 days. 07/27/22 08/01/22  Sloan Leiter, DO  FLUoxetine (PROZAC) 40 MG capsule Take 1 capsule (40 mg total) by mouth daily. 07/01/20   Angiulli, Mcarthur Rossetti, PA-C  ibuprofen (ADVIL) 200 MG tablet Take 400 mg by mouth 3 (three) times daily as needed for moderate pain.    [provider]  levothyroxine (SYNTHROID) 50 MCG tablet Take 50 mcg by mouth daily before breakfast.    [provider]  lidocaine (LIDODERM) 5 % Place 1 patch onto the skin daily. Remove & Discard patch within 12 hours or as directed by MD Patient taking differently: Place 1 patch onto the skin as needed (pain). 07/01/22   Shalhoub, Deno Lunger, MD  LIDOCAINE EX Apply 1 Application topically as needed (pain). Cream    [provider]  methocarbamol (ROBAXIN) 500 MG tablet Take 1 tablet (500 mg total) by mouth 2 (two) times daily. 08/24/22   Darrick Grinder, PA-C  metoCLOPramide (REGLAN) 5 MG tablet Take 5 mg by mouth 2 (two) times daily. 07/10/22   [provider]  metoprolol succinate (TOPROL-XL) 25 MG 24 hr tablet Take 1 tablet (25 mg total) by mouth daily. Follow up with your PCP regarding your heart rate and whether this should be continued. 07/28/23 08/27/23  Maxwell Marion, PA-C  multivitamin (RENA-VIT) TABS tablet Take 1 tablet by mouth at bedtime. 08/01/20   Marcello Fennel, MD  ondansetron (ZOFRAN) 4 MG tablet Take 1 tablet (4 mg total) by mouth every 6 (six) hours. 08/04/22   Gareth Eagle, PA-C  ondansetron (ZOFRAN-ODT) 4 MG disintegrating tablet Take 1 tablet (4 mg total) by mouth every 8 (eight) hours as needed. 07/08/23   Smoot, Shawn Route, PA-C   oxyCODONE-acetaminophen (PERCOCET) 7.5-325 MG tablet Take 1 tablet by mouth 4 (four) times daily.    [provider]  pantoprazole (PROTONIX) 40 MG tablet Take 1 tablet by mouth twice daily 09/21/22   Meredith Pel, NP  potassium chloride SA (KLOR-CON M) 20 MEQ tablet Take 1 tablet (20 mEq total) by mouth daily for 3 days. 07/27/22 07/30/22  Sloan Leiter, DO  prazosin (MINIPRESS) 2 MG capsule Take 1 capsule (2 mg total) by mouth at bedtime. 08/01/20   Marcello Fennel, MD  promethazine (PHENERGAN) 25 MG tablet Take 1 tablet (25 mg total) by mouth every 6 (six) hours as needed for nausea or vomiting. 04/16/23   Arletha Pili, DO  sucralfate (CARAFATE) 1 g tablet Take 1 tablet (1 g total) by mouth with breakfast, with lunch, and with evening meal for 7 days. 07/27/22 08/03/22  Sloan Leiter, DO  tiZANidine (ZANAFLEX) 4 MG tablet Take 1 tablet (4 mg total) by mouth every 6 (six) hours as needed. 07/28/23 08/27/23  Maxwell Marion, PA-C  traZODone (DESYREL) 150 MG tablet Take 150 mg by mouth at bedtime.    [provider]      Allergies    Patient has no known allergies.    Review of Systems   Review of Systems  Musculoskeletal:  Positive for back pain.  All other systems reviewed and are negative.   Physical Exam Updated Vital Signs BP (!) 144/106   Pulse (!) 103   Temp 99.4 F (37.4 C) (Oral)   Resp 18   LMP 06/03/2018 (Approximate) Comment: neg hcg 05/10/20  SpO2 94%  Physical Exam Vitals and nursing note reviewed.  Constitutional:      General: She is not in acute distress.    Appearance: Normal appearance.  HENT:     Head: Normocephalic and atraumatic.     Mouth/Throat:     Mouth: Mucous membranes are moist.  Eyes:     Conjunctiva/sclera: Conjunctivae normal.     Pupils: Pupils are equal, round, and reactive to light.  Cardiovascular:     Rate and Rhythm: Normal rate and regular rhythm.     Pulses: Normal pulses.     Heart sounds: Normal heart sounds.   Pulmonary:     Effort: Pulmonary effort is normal.     Breath sounds: Normal breath sounds.  Abdominal:     Palpations: Abdomen is soft.     Tenderness: There is no abdominal tenderness.  Musculoskeletal:        General: Tenderness present.     Cervical back: Normal range of motion. No tenderness.     Comments: Midline as well as paravertebral tenderness to palpation of the thoracic and lumbar spine.  Strength, sensation, range of motion of upper and lower extremities intact bilaterally.  No tenderness to palpation of the cervical spine.  Range of motion of the neck intact.  Skin:    General: Skin is warm and dry.     Findings: No rash.  Neurological:     General: No focal deficit present.     Mental Status: She is alert.     Sensory: No sensory deficit.     Motor: No weakness.  Psychiatric:        Mood and Affect: Mood normal.        Behavior: Behavior normal.    ED Results / Procedures / Treatments   Labs (all labs ordered are listed, but only abnormal results are displayed) Labs Reviewed - No data to display  EKG None  Radiology CT Cervical Spine Wo Contrast Result Date: 07/28/2023 CLINICAL DATA:  Status post fall. EXAM: CT CERVICAL SPINE WITHOUT CONTRAST TECHNIQUE: Multidetector CT imaging of the cervical spine was performed without intravenous contrast. Multiplanar CT image reconstructions were also generated. RADIATION DOSE REDUCTION: This exam was performed according to the departmental dose-optimization program which includes automated exposure control, adjustment of the mA and/or kV according to patient size and/or use of iterative reconstruction technique. COMPARISON:  Aug 25, 2022 FINDINGS: Alignment: Normal. Skull base and vertebrae: No acute fracture. No primary bone lesion or focal pathologic process. Soft tissues and spinal canal: No prevertebral fluid or swelling. No visible canal hematoma. Disc levels: Normal multilevel endplates are seen with normal multilevel  intervertebral disc spaces. Normal, bilateral multilevel facet joints are noted. Upper chest: Negative. Other: Multiple stable bilateral, predominantly subcentimeter posterior cervical chain lymph nodes are seen. IMPRESSION: 1. No acute fracture or subluxation in the cervical spine. 2. Multiple stable, bilateral, predominantly subcentimeter posterior cervical chain lymph nodes. Correlation with physical examination is recommended. Electronically Signed   By: Aram Candela M.D.   On: 07/28/2023 01:07   CT Thoracic Spine Wo Contrast Result Date: 07/28/2023 CLINICAL DATA:  Fall EXAM: CT THORACIC AND LUMBAR SPINE WITHOUT CONTRAST TECHNIQUE: Multidetector CT imaging of the thoracic and lumbar spine was performed without contrast. Multiplanar CT image reconstructions were also generated. RADIATION DOSE REDUCTION: This exam was performed according to the departmental dose-optimization program which includes automated exposure control, adjustment of the mA and/or kV according to patient size and/or use of iterative reconstruction technique. COMPARISON:  Lumbar spine MRI 06/26/2022 CT abdomen pelvis 07/08/2023 FINDINGS: CT THORACIC SPINE FINDINGS Alignment: Normal. Vertebrae: Superior endplate Schmorl's node at T11. T12 incomplete burst fracture with 25% height loss. 3 mm of retropulsion. Paraspinal and other soft tissues: Negative. Disc levels: No spinal canal stenosis.  06/26/2022 lumbar spine MRI CT LUMBAR SPINE FINDINGS Segmentation: Standard Alignment: Normal Vertebrae: Unchanged L3 superior endplate Schmorl's node and chronic wedge compression fracture of L4. No acute abnormality. Paraspinal and other soft tissues: Calcific aortic atherosclerosis. Disc levels: No spinal canal stenosis. IMPRESSION: 1. T12 incomplete burst fracture with 25% height loss and 3 mm of retropulsion. New since 07/08/2023. 2. Unchanged L3 superior endplate Schmorl's node and chronic wedge compression fracture of L4. Electronically Signed    By: Deatra Robinson M.D.   On: 07/28/2023 00:08   CT Lumbar Spine Wo Contrast Result Date: 07/28/2023 CLINICAL DATA:  Fall EXAM: CT THORACIC AND LUMBAR SPINE WITHOUT CONTRAST TECHNIQUE: Multidetector CT imaging of the thoracic and lumbar spine was performed without contrast. Multiplanar CT image reconstructions were also generated. RADIATION DOSE REDUCTION: This exam was performed according to the departmental dose-optimization program which includes automated exposure control, adjustment of the mA and/or kV according to patient size and/or use of iterative  reconstruction technique. COMPARISON:  Lumbar spine MRI 06/26/2022 CT abdomen pelvis 07/08/2023 FINDINGS: CT THORACIC SPINE FINDINGS Alignment: Normal. Vertebrae: Superior endplate Schmorl's node at T11. T12 incomplete burst fracture with 25% height loss. 3 mm of retropulsion. Paraspinal and other soft tissues: Negative. Disc levels: No spinal canal stenosis.  06/26/2022 lumbar spine MRI CT LUMBAR SPINE FINDINGS Segmentation: Standard Alignment: Normal Vertebrae: Unchanged L3 superior endplate Schmorl's node and chronic wedge compression fracture of L4. No acute abnormality. Paraspinal and other soft tissues: Calcific aortic atherosclerosis. Disc levels: No spinal canal stenosis. IMPRESSION: 1. T12 incomplete burst fracture with 25% height loss and 3 mm of retropulsion. New since 07/08/2023. 2. Unchanged L3 superior endplate Schmorl's node and chronic wedge compression fracture of L4. Electronically Signed   By: Deatra Robinson M.D.   On: 07/28/2023 00:08    Procedures Procedures: not indicated.   Medications Ordered in ED Medications  oxyCODONE-acetaminophen (PERCOCET/ROXICET) 5-325 MG per tablet 1 tablet (1 tablet Oral Given 07/27/23 2321)  metoCLOPramide (REGLAN) tablet 10 mg (10 mg Oral Given 07/27/23 2321)  morphine (PF) 4 MG/ML injection 4 mg (4 mg Intramuscular Given 07/28/23 0100)    ED Course/ Medical Decision Making/ A&P                                  Medical Decision Making Amount and/or Complexity of Data Reviewed Radiology: ordered.  Risk Prescription drug management.   This patient presents to the ED for concern of back pain after fall, this involves an extensive number of treatment options, and is a complaint that carries with it a high risk of complications and morbidity.   Differential diagnosis includes: fracture, malalignment, contusion, abrasion, hematoma, etc.   Comorbidities  See HPI above   Additional History  Additional history obtained from prior records   Imaging Studies  I ordered imaging studies including CT cervical, thoracic, and lumbar spine  I independently visualized and interpreted imaging which showed:  No acute fracture or subluxation cervical spine. T12 incomplete burst fracture with 35% height loss and 3 mm retropulsion. Unchanged L3 superior endplate Schmorl's node chronic wedge compression fracture of L4.   I agree with the radiologist interpretation   Consultations  I requested consultation with Dr. Adelene Idler with neurosurgery,  and discussed lab and imaging findings as well as pertinent plan - they recommend: TLSO brace and outpatient neurosurgery follow up in 2 weeks.   Problem List / ED Course / Critical Interventions / Medication Management  Patient was walking earlier today when she fell and landed on her back.  Endorses diffuse back pain at this time.  Did not hit her head or lose consciousness.  Denies any numbness, tingling, or weakness to the extremities.  Reports nausea with her pain. Has a history of chronic low back pain. I ordered medications including: Percocet for pain Reglan for nausea  Reevaluation of the patient after these medicines showed that the patient's nausea improved but pain remained the same.  Pain improved with morphine. I have reviewed the patients home medicines and have made adjustments as needed. Patient has a contract with pain management.  No  narcotics sent to the pharmacy.  Prescription sent in for patient's home blood pressure medication as she needed a refill.   Social Determinants of Health  Physical activity   Test / Admission - Considered  Discussed finds with patient.  All questions were answered. She is stable and safe for discharge  home. Return precautions given.       Final Clinical Impression(s) / ED Diagnoses Final diagnoses:  T12 burst fracture (HCC)    Rx / DC Orders ED Discharge Orders          Ordered    tiZANidine (ZANAFLEX) 4 MG tablet  Every 6 hours PRN        07/28/23 0204    metoprolol succinate (TOPROL-XL) 25 MG 24 hr tablet  Daily        07/28/23 0204              Maxwell Marion, PA-C 07/28/23 0239    Zadie Rhine, MD 07/28/23 [phone]

## 2023-08-08 ENCOUNTER — Other Ambulatory Visit (HOSPITAL_COMMUNITY): Payer: Self-pay

## 2023-08-08 MED ORDER — OXYCODONE HCL 15 MG PO TABS
15.0000 mg | ORAL_TABLET | Freq: Four times a day (QID) | ORAL | 0 refills | Status: DC | PRN
  Filled 2023-08-08: qty 70, 17d supply, fill #0
  Filled 2023-08-08: qty 50, 13d supply, fill #0

## 2023-08-09 ENCOUNTER — Emergency Department (HOSPITAL_COMMUNITY)
Admission: EM | Admit: 2023-08-09 | Discharge: 2023-08-10 | Disposition: A | Discharge status: Home / Self Care | Attending: Emergency Medicine | Admitting: Emergency Medicine

## 2023-08-09 DIAGNOSIS — Z794 Long term (current) use of insulin: Secondary | ICD-10-CM | POA: Diagnosis not present

## 2023-08-09 DIAGNOSIS — R112 Nausea with vomiting, unspecified: Secondary | ICD-10-CM | POA: Diagnosis present

## 2023-08-09 DIAGNOSIS — R1084 Generalized abdominal pain: Secondary | ICD-10-CM | POA: Insufficient documentation

## 2023-08-09 DIAGNOSIS — R000 Tachycardia, unspecified: Secondary | ICD-10-CM | POA: Insufficient documentation

## 2023-08-09 DIAGNOSIS — M546 Pain in thoracic spine: Secondary | ICD-10-CM | POA: Insufficient documentation

## 2023-08-10 ENCOUNTER — Encounter (HOSPITAL_COMMUNITY): Payer: Self-pay

## 2023-08-10 ENCOUNTER — Other Ambulatory Visit: Payer: Self-pay

## 2023-08-10 LAB — COMPREHENSIVE METABOLIC PANEL WITH GFR
ALT: 21 U/L (ref 0–44)
AST: 24 U/L (ref 15–41)
Albumin: 4.2 g/dL (ref 3.5–5.0)
Alkaline Phosphatase: 139 U/L — ABNORMAL HIGH (ref 38–126)
Anion gap: >20 — ABNORMAL HIGH (ref 5–15)
BUN: 9 mg/dL (ref 6–20)
CO2: 13 mmol/L — ABNORMAL LOW (ref 22–32)
Calcium: 9.9 mg/dL (ref 8.9–10.3)
Chloride: 109 mmol/L (ref 98–111)
Creatinine, Ser: 0.88 mg/dL (ref 0.44–1.00)
GFR, Estimated: >60 mL/min (ref >60)
Glucose, Bld: 242 mg/dL — ABNORMAL HIGH (ref 70–99)
Potassium: 3.4 mmol/L — ABNORMAL LOW (ref 3.5–5.1)
Sodium: 143 mmol/L (ref 135–145)
Total Bilirubin: 0.7 mg/dL (ref 0.0–1.2)
Total Protein: 9 g/dL — ABNORMAL HIGH (ref 6.5–8.1)

## 2023-08-10 LAB — BASIC METABOLIC PANEL WITH GFR
Anion gap: 15 (ref 5–15)
BUN: 7 mg/dL (ref 6–20)
CO2: 20 mmol/L — ABNORMAL LOW (ref 22–32)
Calcium: 9.7 mg/dL (ref 8.9–10.3)
Chloride: 110 mmol/L (ref 98–111)
Creatinine, Ser: 0.69 mg/dL (ref 0.44–1.00)
GFR, Estimated: >60 mL/min (ref >60)
Glucose, Bld: 162 mg/dL — ABNORMAL HIGH (ref 70–99)
Potassium: 3.4 mmol/L — ABNORMAL LOW (ref 3.5–5.1)
Sodium: 145 mmol/L (ref 135–145)

## 2023-08-10 LAB — CBC
HCT: 44.6 % (ref 36.0–46.0)
Hemoglobin: 13.7 g/dL (ref 12.0–15.0)
MCH: 26.6 pg (ref 26.0–34.0)
MCHC: 30.7 g/dL (ref 30.0–36.0)
MCV: 86.6 fL (ref 80.0–100.0)
Platelets: 566 10*3/uL — ABNORMAL HIGH (ref 150–400)
RBC: 5.15 MIL/uL — ABNORMAL HIGH (ref 3.87–5.11)
RDW: 14.9 % (ref 11.5–15.5)
WBC: 29.5 10*3/uL — ABNORMAL HIGH (ref 4.0–10.5)
nRBC: 0 % (ref 0.0–0.2)

## 2023-08-10 LAB — LIPASE, BLOOD: Lipase: 21 U/L (ref 11–51)

## 2023-08-10 MED ORDER — HYDROMORPHONE HCL 1 MG/ML IJ SOLN
1.0000 mg | Freq: Once | INTRAMUSCULAR | Status: AC
  Administered 2023-08-10: 1 mg via INTRAVENOUS
  Filled 2023-08-10: qty 1

## 2023-08-10 MED ORDER — LACTATED RINGERS IV BOLUS
1000.0000 mL | Freq: Once | INTRAVENOUS | Status: AC
  Administered 2023-08-10: 1000 mL via INTRAVENOUS

## 2023-08-10 MED ORDER — PROMETHAZINE HCL 25 MG PO TABS
25.0000 mg | ORAL_TABLET | Freq: Four times a day (QID) | ORAL | 0 refills | Status: DC | PRN
Start: 2023-08-10 — End: 2023-10-11

## 2023-08-10 MED ORDER — HYDROMORPHONE HCL 1 MG/ML IJ SOLN
2.0000 mg | Freq: Once | INTRAMUSCULAR | Status: AC
  Administered 2023-08-10: 2 mg via INTRAVENOUS
  Filled 2023-08-10: qty 2

## 2023-08-10 MED ORDER — OXYCODONE HCL 5 MG PO TABS
15.0000 mg | ORAL_TABLET | Freq: Once | ORAL | Status: AC
  Administered 2023-08-10: 15 mg via ORAL
  Filled 2023-08-10: qty 3

## 2023-08-10 MED ORDER — DROPERIDOL 2.5 MG/ML IJ SOLN
2.5000 mg | Freq: Once | INTRAMUSCULAR | Status: AC
  Administered 2023-08-10: 2.5 mg via INTRAVENOUS
  Filled 2023-08-10: qty 2

## 2023-08-10 NOTE — ED Provider Notes (Signed)
 [place] EMERGENCY DEPARTMENT AT [facility] Provider Note   CSN: [id number] Arrival date & time: 08/09/23  2350     History  No chief complaint on file.   [name] is a 53 y.o. female.  Patient with past medical history significant for inappropriate sinus tachycardia, bipolar affective disorder, intractable abdominal pain, cyclical vomiting syndrome, T12 burst fracture currently in TLSO brace presents the emergency room complaining of intractable nausea and vomiting which has been preventing her from taking her prescribed pain medication for her chronic pain and spinal fracture.  The patient is unsure as to what triggers her vomiting.  She has history of multiple presentations for the same.  She states that normally droperidol  helps alleviate her symptoms.  She states that for the past 10 hours she has been unable to take any of her prescribed oxycodone  15's.  Patient denies shortness of breath, chest pain, fever, urinary symptoms, headache.  Complains of generalized abdominal discomfort with intractable nausea and vomiting.  Patient also complains of severe back pain.  HPI     Home Medications Prior to Admission medications   Medication Sig Start Date End Date Taking? Authorizing Provider  acetaminophen  (TYLENOL ) 650 MG CR tablet Take 1,300 mg by mouth as needed for pain.    [provider]  ARIPiprazole  (ABILIFY ) 5 MG tablet Take 1 tablet (5 mg total) by mouth daily. 08/01/20   Lydia Sams, MD  atorvastatin  (LIPITOR) 10 MG tablet Take 10 mg by mouth every evening. 08/26/20   [provider]  Baclofen 5 MG TABS Take 1 tablet by mouth 2 (two) times daily. 07/10/23   [provider]  clonazePAM (KLONOPIN) 0.5 MG tablet Take 0.5 mg by mouth daily as needed. 07/19/23   [provider]  dicyclomine  (BENTYL ) 20 MG tablet Take 1 tablet (20 mg total) by mouth 2 (two) times daily for 5 days. 07/27/22 08/01/22  Teddi Favors, DO  DULoxetine   (CYMBALTA ) 60 MG capsule Take 60 mg by mouth 2 (two) times daily. 06/26/23   [provider]  FLUoxetine  (PROZAC ) 40 MG capsule Take 1 capsule (40 mg total) by mouth daily. 07/01/20   Angiulli, Everlyn Hockey, PA-C  ibuprofen  (ADVIL ) 200 MG tablet Take 400 mg by mouth 3 (three) times daily as needed for moderate pain.    [provider]  levothyroxine  (SYNTHROID ) 50 MCG tablet Take 50 mcg by mouth daily before breakfast.    [provider]  lidocaine  (LIDODERM ) 5 % Place 1 patch onto the skin daily. Remove & Discard patch within 12 hours or as directed by MD Patient taking differently: Place 1 patch onto the skin as needed (pain). 07/01/22   Shalhoub, Merrill Abide, MD  LIDOCAINE  EX Apply 1 Application topically as needed (pain). Cream    [provider]  methocarbamol  (ROBAXIN ) 500 MG tablet Take 1 tablet (500 mg total) by mouth 2 (two) times daily. 08/24/22   Elisa Guest, PA-C  metoCLOPramide  (REGLAN ) 5 MG tablet Take 5 mg by mouth 2 (two) times daily. 07/10/22   [provider]  metoprolol  succinate (TOPROL -XL) 25 MG 24 hr tablet Take 1 tablet (25 mg total) by mouth daily. Follow up with your PCP regarding your heart rate and whether this should be continued. 07/28/23 08/27/23  Sonnie Dusky, PA-C  mirtazapine (REMERON) 15 MG tablet Take 15 mg by mouth at bedtime. 07/09/23   [provider]  MOUNJARO 5 MG/0.5ML Pen Inject 5 mg into the skin once  a week. 07/10/23   [provider]  multivitamin (RENA-VIT) TABS tablet Take 1 tablet by mouth at bedtime. 08/01/20   Lydia Sams, MD  ondansetron  (ZOFRAN ) 4 MG tablet Take 1 tablet (4 mg total) by mouth every 6 (six) hours. 08/04/22   Robinson, John K, PA-C  ondansetron  (ZOFRAN -ODT) 4 MG disintegrating tablet Take 1 tablet (4 mg total) by mouth every 8 (eight) hours as needed. 07/08/23   Smoot, Genevive Ket, PA-C  oxyCODONE  (ROXICODONE ) 15 MG immediate release tablet Take 1 tablet (15 mg total) by mouth every 6  (six) hours as needed. 08/08/23     oxyCODONE -acetaminophen  (PERCOCET) 10-325 MG tablet Take 1 tablet by mouth 4 (four) times daily. 07/13/23   [provider]  oxyCODONE -acetaminophen  (PERCOCET) 7.5-325 MG tablet Take 1 tablet by mouth 4 (four) times daily.    [provider]  pantoprazole  (PROTONIX ) 40 MG tablet Take 1 tablet by mouth twice daily 09/21/22   Arlee Bellows, NP  potassium chloride  SA (KLOR-CON  M) 20 MEQ tablet Take 1 tablet (20 mEq total) by mouth daily for 3 days. 07/27/22 07/30/22  Teddi Favors, DO  prazosin  (MINIPRESS ) 2 MG capsule Take 1 capsule (2 mg total) by mouth at bedtime. 08/01/20   Lydia Sams, MD  prazosin  (MINIPRESS ) 5 MG capsule Take 5 mg by mouth at bedtime. 07/19/23   [provider]  pregabalin (LYRICA) 50 MG capsule Take 50 mg by mouth 2 (two) times daily. 07/10/23   [provider]  promethazine  (PHENERGAN ) 25 MG tablet Take 1 tablet (25 mg total) by mouth every 6 (six) hours as needed for nausea or vomiting. 08/10/23   Elisa Guest, PA-C  sucralfate  (CARAFATE ) 1 g tablet Take 1 tablet (1 g total) by mouth with breakfast, with lunch, and with evening meal for 7 days. 07/27/22 08/03/22  Teddi Favors, DO  tiZANidine  (ZANAFLEX ) 4 MG tablet Take 1 tablet (4 mg total) by mouth every 6 (six) hours as needed. 07/28/23 08/27/23  Sonnie Dusky, PA-C  traZODone  (DESYREL ) 150 MG tablet Take 150 mg by mouth at bedtime.    [provider]  trazodone  (DESYREL ) 300 MG tablet Take 300 mg by mouth at bedtime. 06/30/23   [provider]      Allergies    Patient has no known allergies.    Review of Systems   Review of Systems  Physical Exam Updated Vital Signs BP (!) 136/111   Pulse (!) 118   Temp 98 F (36.7 C) (Oral)   Resp 16   Ht 5\' 4"  (1.626 m)   Wt 93.4 kg   LMP 06/03/2018 (Approximate) Comment: neg hcg 05/10/20  SpO2 100%   BMI 35.36 kg/m  Physical Exam Vitals and nursing note reviewed.  Constitutional:       General: She is not in acute distress.    Appearance: She is well-developed.  HENT:     Head: Normocephalic and atraumatic.  Eyes:     Conjunctiva/sclera: Conjunctivae normal.  Cardiovascular:     Rate and Rhythm: Regular rhythm. Tachycardia present.  Pulmonary:     Effort: Pulmonary effort is normal. No respiratory distress.     Breath sounds: Normal breath sounds.  Abdominal:     Palpations: Abdomen is soft.     Tenderness: There is no abdominal tenderness.     Comments: No significant abdominal tenderness on exam  Musculoskeletal:        General: No swelling.     Cervical  back: Neck supple.     Comments: Patient wearing TLSO brace  Skin:    General: Skin is warm and dry.     Capillary Refill: Capillary refill takes less than 2 seconds.  Neurological:     Mental Status: She is alert.  Psychiatric:        Mood and Affect: Mood normal.     ED Results / Procedures / Treatments   Labs (all labs ordered are listed, but only abnormal results are displayed) Labs Reviewed  COMPREHENSIVE METABOLIC PANEL WITH GFR - Abnormal; Notable for the following components:      Result Value   Potassium 3.4 (*)    CO2 13 (*)    Glucose, Bld 242 (*)    Total Protein 9.0 (*)    Alkaline Phosphatase 139 (*)    Anion gap >20 (*)    All other components within normal limits  CBC - Abnormal; Notable for the following components:   WBC 29.5 (*)    RBC 5.15 (*)    Platelets 566 (*)    All other components within normal limits  BASIC METABOLIC PANEL WITH GFR - Abnormal; Notable for the following components:   Potassium 3.4 (*)    CO2 20 (*)    Glucose, Bld 162 (*)    All other components within normal limits  LIPASE, BLOOD  URINALYSIS, ROUTINE W REFLEX MICROSCOPIC    EKG EKG Interpretation Date/Time:  Saturday August 10 2023 00:48:45 EDT Ventricular Rate:  119 PR Interval:  134 QRS Duration:  82 QT Interval:  315 QTC Calculation: 444 R Axis:   14  Text Interpretation: Sinus  tachycardia Paired ventricular premature complexes Aberrant conduction of SV complex(es) RSR' in V1 or V2, right VCD or RVH Nonspecific T abnormalities, diffuse leads Confirmed by Rosealee Concha ([id number]) on 08/10/2023 5:26:42 AM  Radiology No results found.  Procedures Procedures    Medications Ordered in ED Medications  HYDROmorphone  (DILAUDID ) injection 2 mg (2 mg Intravenous Given 08/10/23 0149)  droperidol  (INAPSINE ) 2.5 MG/ML injection 2.5 mg (2.5 mg Intravenous Given 08/10/23 0149)  lactated ringers  bolus 1,000 mL (1,000 mLs Intravenous New Bag/Given 08/10/23 0308)  oxyCODONE  (Oxy IR/ROXICODONE ) immediate release tablet 15 mg (15 mg Oral Given 08/10/23 0433)  HYDROmorphone  (DILAUDID ) injection 1 mg (1 mg Intravenous Given 08/10/23 0534)    ED Course/ Medical Decision Making/ A&P                                 Medical Decision Making Amount and/or Complexity of Data Reviewed Labs: ordered.  Risk Prescription drug management.   This patient presents to the ED for concern of nausea and vomiting, this involves an extensive number of treatment options, and is a complaint that carries with it a high risk of complications and morbidity.  The differential diagnosis includes cyclical vomiting syndrome, appendicitis, cholecystitis, others   Co morbidities that complicate the patient evaluation  Hx of cyclical vomiting episodes   Additional history obtained:  Additional history obtained from EMS   Lab Tests:  I Ordered, and personally interpreted labs.  The pertinent results include: Leukocytosis with a white count of 29,500, bicarb of 13, anion gap greater than 20.  Repeat metabolic panel shows   Imaging Studies ordered:  Patient with no abdominal tenderness on exam.  No signs of surgical/acute abdomen.  No indication for emergent imaging at this time   Cardiac Monitoring: / EKG:  The  patient was maintained on a cardiac monitor.  I personally viewed and interpreted the  cardiac monitored which showed an underlying rhythm of: Sinus tachycardia   Problem List / ED Course / Critical interventions / Medication management   I ordered medication including droperidol  for nausea/vomiting, Dilaudid  and oxycodone  for pain, LR bolus for fluid resuscitation Reevaluation of the patient after these medicines showed that the patient improved I have reviewed the patients home medicines and have made adjustments as needed    Test / Admission - Considered:  Nausea is now under control after droperidol .  Patient with history of similar vomiting presentation, unclear as to underlying etiology.  Patient does have metabolic acidosis with anion gap, and low bicarb.  Likely due to excessive vomiting throughout the day.  Repeat BMP significantly improved with CO2 of 20 and anion gap of 15.  Patient also has history of leukocytosis.  History not consistent with infection.  Chart review does show leukocytosis with previous visits for intractable nausea and vomiting as well.  Feel this may be reactive.  Patient remains mildly tachycardic but has a history of inappropriate sinus tachycardia.  She states that this heart rate is normal for her.  At this time patient is able to tolerate oral intake and has been keeping down her oral pain medication.  She has the known burst fracture in the T12 vertebrae.  I see no indication for admission or further emergent treatment/workup.  Patient to be discharged home with recommendations for continued prescribed pain medication and for continued follow-up as needed with neurosurgery for spinal fracture.  Patient voices understanding with plan.         Final Clinical Impression(s) / ED Diagnoses Final diagnoses:  Nausea and vomiting, unspecified vomiting type  Midline thoracic back pain, unspecified chronicity    Rx / DC Orders ED Discharge Orders          Ordered    promethazine  (PHENERGAN ) 25 MG tablet  Every 6 hours PRN        08/10/23  0603              Elisa Guest, PA-C 08/10/23 1610    Rosealee Concha, MD 08/10/23 [phone]

## 2023-08-10 NOTE — ED Triage Notes (Signed)
 Pt reports fracturing spine a week and a half ago after a fall. Brace on currently. Pt vomiting for past 10 hours and unable to keep pain medication down. Estimated 15 episodes emesis. Denies blood in vomit. Denies fever. 5 episodes of diarrhea. Pt in severe pain in triage.

## 2023-08-10 NOTE — ED Notes (Signed)
 This nurse called patient's husband for ride.

## 2023-08-10 NOTE — ED Notes (Addendum)
 [name]

## 2023-08-10 NOTE — Discharge Instructions (Addendum)
 You may resume your home medications for pain control and nausea.  I have sent a prescription for Phenergan  to your pharmacy which may be used for continued nausea relief.  Please follow-up with your primary team for further management of your nausea and vomiting and follow-up with neurosurgery as needed for further management of your T12 vertebral fracture.  If you develop any life-threatening symptoms please return to the emergency department.

## 2023-08-27 ENCOUNTER — Other Ambulatory Visit (HOSPITAL_COMMUNITY): Payer: Self-pay

## 2023-08-27 ENCOUNTER — Other Ambulatory Visit (HOSPITAL_COMMUNITY): Payer: Self-pay | Admitting: Student

## 2023-08-27 DIAGNOSIS — S22080A Wedge compression fracture of T11-T12 vertebra, initial encounter for closed fracture: Secondary | ICD-10-CM

## 2023-08-27 MED ORDER — TIZANIDINE HCL 4 MG PO TABS
4.0000 mg | ORAL_TABLET | Freq: Three times a day (TID) | ORAL | 0 refills | Status: DC | PRN
Start: 2023-08-27 — End: 2023-09-06
  Filled 2023-08-27 (×2): qty 45, 15d supply, fill #0

## 2023-09-06 ENCOUNTER — Other Ambulatory Visit (HOSPITAL_COMMUNITY): Payer: Self-pay

## 2023-09-06 ENCOUNTER — Ambulatory Visit (HOSPITAL_COMMUNITY)

## 2023-09-06 MED ORDER — OXYCODONE HCL 15 MG PO TABS
15.0000 mg | ORAL_TABLET | Freq: Four times a day (QID) | ORAL | 0 refills | Status: AC | PRN
Start: 2023-09-06 — End: ?
  Filled 2023-09-06: qty 120, 30d supply, fill #0

## 2023-09-06 MED ORDER — TIZANIDINE HCL 4 MG PO TABS
4.0000 mg | ORAL_TABLET | Freq: Four times a day (QID) | ORAL | 1 refills | Status: DC | PRN
  Filled 2023-09-06: qty 120, 30d supply, fill #0
  Filled 2023-09-24: qty 120, 30d supply, fill #1

## 2023-09-11 ENCOUNTER — Ambulatory Visit (HOSPITAL_COMMUNITY)
Admission: RE | Admit: 2023-09-11 | Discharge: 2023-09-11 | Disposition: A | Discharge status: Home / Self Care | Source: Ambulatory Visit | Attending: Student | Admitting: Student

## 2023-09-11 DIAGNOSIS — S22080A Wedge compression fracture of T11-T12 vertebra, initial encounter for closed fracture: Secondary | ICD-10-CM | POA: Diagnosis present

## 2023-09-24 ENCOUNTER — Other Ambulatory Visit (HOSPITAL_COMMUNITY): Payer: Self-pay

## 2023-09-24 ENCOUNTER — Encounter: Payer: Self-pay | Admitting: Internal Medicine

## 2023-09-30 ENCOUNTER — Other Ambulatory Visit (HOSPITAL_COMMUNITY): Payer: Self-pay

## 2023-09-30 ENCOUNTER — Other Ambulatory Visit: Payer: Self-pay

## 2023-09-30 MED ORDER — MOUNJARO 7.5 MG/0.5ML ~~LOC~~ SOAJ
7.5000 mg | SUBCUTANEOUS | 1 refills | Status: DC
  Filled 2023-09-30: qty 2, 28d supply, fill #0

## 2023-09-30 MED ORDER — ATORVASTATIN CALCIUM 20 MG PO TABS
20.0000 mg | ORAL_TABLET | Freq: Every day | ORAL | 3 refills | Status: AC
  Filled 2023-09-30: qty 90, 90d supply, fill #0
  Filled 2024-02-25: qty 90, 90d supply, fill #1
  Filled 2024-05-28: qty 90, 90d supply, fill #2

## 2023-10-01 ENCOUNTER — Other Ambulatory Visit: Payer: Self-pay | Admitting: Neurological Surgery

## 2023-10-04 ENCOUNTER — Other Ambulatory Visit (HOSPITAL_COMMUNITY): Payer: Self-pay

## 2023-10-04 MED ORDER — OXYCODONE HCL 15 MG PO TABS
15.0000 mg | ORAL_TABLET | Freq: Four times a day (QID) | ORAL | 0 refills | Status: DC | PRN
  Filled 2023-10-04: qty 120, 30d supply, fill #0

## 2023-10-08 ENCOUNTER — Inpatient Hospital Stay (HOSPITAL_COMMUNITY)
Admission: EM | Admit: 2023-10-08 | Discharge: 2023-10-11 | DRG: 641 | Disposition: A | Discharge status: Home / Self Care | Attending: Internal Medicine | Admitting: Internal Medicine

## 2023-10-08 ENCOUNTER — Observation Stay (HOSPITAL_COMMUNITY)

## 2023-10-08 ENCOUNTER — Encounter (HOSPITAL_COMMUNITY): Payer: Self-pay

## 2023-10-08 ENCOUNTER — Other Ambulatory Visit: Payer: Self-pay

## 2023-10-08 DIAGNOSIS — N179 Acute kidney failure, unspecified: Principal | ICD-10-CM | POA: Diagnosis present

## 2023-10-08 DIAGNOSIS — E860 Dehydration: Secondary | ICD-10-CM | POA: Diagnosis not present

## 2023-10-08 DIAGNOSIS — R109 Unspecified abdominal pain: Secondary | ICD-10-CM | POA: Diagnosis present

## 2023-10-08 DIAGNOSIS — R112 Nausea with vomiting, unspecified: Secondary | ICD-10-CM | POA: Diagnosis present

## 2023-10-08 DIAGNOSIS — E66812 Obesity, class 2: Secondary | ICD-10-CM | POA: Diagnosis present

## 2023-10-08 DIAGNOSIS — M549 Dorsalgia, unspecified: Secondary | ICD-10-CM | POA: Diagnosis present

## 2023-10-08 DIAGNOSIS — Z83719 Family history of colon polyps, unspecified: Secondary | ICD-10-CM

## 2023-10-08 DIAGNOSIS — Z803 Family history of malignant neoplasm of breast: Secondary | ICD-10-CM

## 2023-10-08 DIAGNOSIS — Z6835 Body mass index (BMI) 35.0-35.9, adult: Secondary | ICD-10-CM

## 2023-10-08 DIAGNOSIS — I2510 Atherosclerotic heart disease of native coronary artery without angina pectoris: Secondary | ICD-10-CM | POA: Diagnosis present

## 2023-10-08 DIAGNOSIS — Z7985 Long-term (current) use of injectable non-insulin antidiabetic drugs: Secondary | ICD-10-CM

## 2023-10-08 DIAGNOSIS — F419 Anxiety disorder, unspecified: Secondary | ICD-10-CM | POA: Diagnosis present

## 2023-10-08 DIAGNOSIS — E119 Type 2 diabetes mellitus without complications: Secondary | ICD-10-CM | POA: Diagnosis present

## 2023-10-08 DIAGNOSIS — Z8659 Personal history of other mental and behavioral disorders: Secondary | ICD-10-CM

## 2023-10-08 DIAGNOSIS — E785 Hyperlipidemia, unspecified: Secondary | ICD-10-CM | POA: Diagnosis present

## 2023-10-08 DIAGNOSIS — Z8674 Personal history of sudden cardiac arrest: Secondary | ICD-10-CM

## 2023-10-08 DIAGNOSIS — F4310 Post-traumatic stress disorder, unspecified: Secondary | ICD-10-CM | POA: Diagnosis present

## 2023-10-08 DIAGNOSIS — G8929 Other chronic pain: Secondary | ICD-10-CM | POA: Diagnosis present

## 2023-10-08 DIAGNOSIS — I700 Atherosclerosis of aorta: Secondary | ICD-10-CM | POA: Diagnosis present

## 2023-10-08 DIAGNOSIS — E876 Hypokalemia: Secondary | ICD-10-CM | POA: Diagnosis present

## 2023-10-08 DIAGNOSIS — Z85828 Personal history of other malignant neoplasm of skin: Secondary | ICD-10-CM

## 2023-10-08 DIAGNOSIS — E039 Hypothyroidism, unspecified: Secondary | ICD-10-CM | POA: Diagnosis present

## 2023-10-08 DIAGNOSIS — Z833 Family history of diabetes mellitus: Secondary | ICD-10-CM

## 2023-10-08 DIAGNOSIS — I10 Essential (primary) hypertension: Secondary | ICD-10-CM | POA: Diagnosis present

## 2023-10-08 DIAGNOSIS — Z79899 Other long term (current) drug therapy: Secondary | ICD-10-CM

## 2023-10-08 DIAGNOSIS — Z79890 Hormone replacement therapy (postmenopausal): Secondary | ICD-10-CM

## 2023-10-08 DIAGNOSIS — K2270 Barrett's esophagus without dysplasia: Secondary | ICD-10-CM | POA: Diagnosis present

## 2023-10-08 DIAGNOSIS — E8809 Other disorders of plasma-protein metabolism, not elsewhere classified: Secondary | ICD-10-CM | POA: Diagnosis present

## 2023-10-08 DIAGNOSIS — F319 Bipolar disorder, unspecified: Secondary | ICD-10-CM | POA: Diagnosis present

## 2023-10-08 DIAGNOSIS — R000 Tachycardia, unspecified: Secondary | ICD-10-CM | POA: Diagnosis present

## 2023-10-08 LAB — URINALYSIS, ROUTINE W REFLEX MICROSCOPIC
Bacteria, UA: NONE SEEN
Bilirubin Urine: NEGATIVE
Glucose, UA: NEGATIVE mg/dL
Hgb urine dipstick: NEGATIVE
Ketones, ur: 5 mg/dL — AB
Leukocytes,Ua: NEGATIVE
Nitrite: NEGATIVE
Protein, ur: 30 mg/dL — AB
Specific Gravity, Urine: 1.019 (ref 1.005–1.030)
pH: 5 (ref 5.0–8.0)

## 2023-10-08 LAB — CBC WITH DIFFERENTIAL/PLATELET
Abs Immature Granulocytes: 0.08 10*3/uL — ABNORMAL HIGH (ref 0.00–0.07)
Basophils Absolute: 0.1 10*3/uL (ref 0.0–0.1)
Basophils Relative: 0 %
Eosinophils Absolute: 0 10*3/uL (ref 0.0–0.5)
Eosinophils Relative: 0 %
HCT: 45.6 % (ref 36.0–46.0)
Hemoglobin: 14.2 g/dL (ref 12.0–15.0)
Immature Granulocytes: 1 %
Lymphocytes Relative: 8 %
Lymphs Abs: 1.1 10*3/uL (ref 0.7–4.0)
MCH: 26.9 pg (ref 26.0–34.0)
MCHC: 31.1 g/dL (ref 30.0–36.0)
MCV: 86.5 fL (ref 80.0–100.0)
Monocytes Absolute: 1.1 10*3/uL — ABNORMAL HIGH (ref 0.1–1.0)
Monocytes Relative: 8 %
Neutro Abs: 11.5 10*3/uL — ABNORMAL HIGH (ref 1.7–7.7)
Neutrophils Relative %: 83 %
Platelets: 380 10*3/uL (ref 150–400)
RBC: 5.27 MIL/uL — ABNORMAL HIGH (ref 3.87–5.11)
RDW: 18.1 % — ABNORMAL HIGH (ref 11.5–15.5)
WBC: 13.9 10*3/uL — ABNORMAL HIGH (ref 4.0–10.5)
nRBC: 0 % (ref 0.0–0.2)

## 2023-10-08 LAB — COMPREHENSIVE METABOLIC PANEL WITH GFR
ALT: 23 U/L (ref 0–44)
AST: 22 U/L (ref 15–41)
Albumin: 4.2 g/dL (ref 3.5–5.0)
Alkaline Phosphatase: 140 U/L — ABNORMAL HIGH (ref 38–126)
Anion gap: 18 — ABNORMAL HIGH (ref 5–15)
BUN: 10 mg/dL (ref 6–20)
CO2: 20 mmol/L — ABNORMAL LOW (ref 22–32)
Calcium: 10.1 mg/dL (ref 8.9–10.3)
Chloride: 104 mmol/L (ref 98–111)
Creatinine, Ser: 1.23 mg/dL — ABNORMAL HIGH (ref 0.44–1.00)
GFR, Estimated: 53 mL/min — ABNORMAL LOW (ref >60)
Glucose, Bld: 195 mg/dL — ABNORMAL HIGH (ref 70–99)
Potassium: 3.3 mmol/L — ABNORMAL LOW (ref 3.5–5.1)
Sodium: 142 mmol/L (ref 135–145)
Total Bilirubin: 0.9 mg/dL (ref 0.0–1.2)
Total Protein: 9.1 g/dL — ABNORMAL HIGH (ref 6.5–8.1)

## 2023-10-08 LAB — BASIC METABOLIC PANEL WITH GFR
Anion gap: 12 (ref 5–15)
BUN: 7 mg/dL (ref 6–20)
CO2: 21 mmol/L — ABNORMAL LOW (ref 22–32)
Calcium: 8.5 mg/dL — ABNORMAL LOW (ref 8.9–10.3)
Chloride: 111 mmol/L (ref 98–111)
Creatinine, Ser: 0.82 mg/dL (ref 0.44–1.00)
GFR, Estimated: >60 mL/min (ref >60)
Glucose, Bld: 110 mg/dL — ABNORMAL HIGH (ref 70–99)
Potassium: 3.2 mmol/L — ABNORMAL LOW (ref 3.5–5.1)
Sodium: 144 mmol/L (ref 135–145)

## 2023-10-08 LAB — I-STAT CG4 LACTIC ACID, ED
Lactic Acid, Venous: 1 mmol/L (ref 0.5–1.9)
Lactic Acid, Venous: 1 mmol/L (ref 0.5–1.9)

## 2023-10-08 LAB — MAGNESIUM: Magnesium: 1.8 mg/dL (ref 1.7–2.4)

## 2023-10-08 LAB — LIPASE, BLOOD: Lipase: 21 U/L (ref 11–51)

## 2023-10-08 LAB — RAPID URINE DRUG SCREEN, HOSP PERFORMED
Amphetamines: POSITIVE — AB
Barbiturates: NOT DETECTED
Benzodiazepines: NOT DETECTED
Cocaine: NOT DETECTED
Opiates: POSITIVE — AB
Tetrahydrocannabinol: NOT DETECTED

## 2023-10-08 LAB — PHOSPHORUS: Phosphorus: 4.2 mg/dL (ref 2.5–4.6)

## 2023-10-08 MED ORDER — HYDROMORPHONE HCL 1 MG/ML IJ SOLN
1.0000 mg | Freq: Once | INTRAMUSCULAR | Status: AC
  Administered 2023-10-08: 1 mg via INTRAVENOUS
  Filled 2023-10-08: qty 1

## 2023-10-08 MED ORDER — TIZANIDINE HCL 4 MG PO TABS
4.0000 mg | ORAL_TABLET | Freq: Once | ORAL | Status: AC
  Administered 2023-10-08: 4 mg via ORAL
  Filled 2023-10-08: qty 1

## 2023-10-08 MED ORDER — DICYCLOMINE HCL 20 MG PO TABS
20.0000 mg | ORAL_TABLET | Freq: Two times a day (BID) | ORAL | Status: DC
  Administered 2023-10-08 – 2023-10-11 (×7): 20 mg via ORAL
  Filled 2023-10-08 (×7): qty 1

## 2023-10-08 MED ORDER — ATORVASTATIN CALCIUM 20 MG PO TABS
20.0000 mg | ORAL_TABLET | Freq: Every day | ORAL | Status: DC
  Administered 2023-10-08 – 2023-10-11 (×4): 20 mg via ORAL
  Filled 2023-10-08 (×4): qty 1

## 2023-10-08 MED ORDER — ENOXAPARIN SODIUM 40 MG/0.4ML IJ SOSY
40.0000 mg | PREFILLED_SYRINGE | INTRAMUSCULAR | Status: DC
  Administered 2023-10-08 – 2023-10-10 (×3): 40 mg via SUBCUTANEOUS
  Filled 2023-10-08 (×3): qty 0.4

## 2023-10-08 MED ORDER — TIZANIDINE HCL 4 MG PO TABS: 4.0000 mg | ORAL_TABLET | Freq: Four times a day (QID) | ORAL | Status: DC | PRN

## 2023-10-08 MED ORDER — METOCLOPRAMIDE HCL 5 MG/ML IJ SOLN
10.0000 mg | Freq: Three times a day (TID) | INTRAMUSCULAR | Status: DC
  Administered 2023-10-08 – 2023-10-11 (×9): 10 mg via INTRAVENOUS
  Filled 2023-10-08 (×9): qty 2

## 2023-10-08 MED ORDER — DULOXETINE HCL 60 MG PO CPEP
60.0000 mg | ORAL_CAPSULE | Freq: Two times a day (BID) | ORAL | Status: DC
  Administered 2023-10-08 – 2023-10-11 (×7): 60 mg via ORAL
  Filled 2023-10-08 (×7): qty 1

## 2023-10-08 MED ORDER — ARIPIPRAZOLE 10 MG PO TABS
5.0000 mg | ORAL_TABLET | Freq: Every day | ORAL | Status: DC
  Administered 2023-10-08 – 2023-10-11 (×4): 5 mg via ORAL
  Filled 2023-10-08 (×4): qty 1

## 2023-10-08 MED ORDER — ACETAMINOPHEN 650 MG RE SUPP: 650.0000 mg | Freq: Four times a day (QID) | RECTAL | Status: DC | PRN

## 2023-10-08 MED ORDER — LEVOTHYROXINE SODIUM 50 MCG PO TABS
50.0000 ug | ORAL_TABLET | Freq: Every day | ORAL | Status: DC
  Administered 2023-10-09 – 2023-10-11 (×3): 50 ug via ORAL
  Filled 2023-10-08 (×3): qty 1

## 2023-10-08 MED ORDER — ACETAMINOPHEN 325 MG PO TABS: 650.0000 mg | ORAL_TABLET | Freq: Four times a day (QID) | ORAL | Status: DC | PRN

## 2023-10-08 MED ORDER — OXYCODONE HCL 5 MG PO TABS
15.0000 mg | ORAL_TABLET | Freq: Once | ORAL | Status: AC
  Administered 2023-10-08: 15 mg via ORAL
  Filled 2023-10-08: qty 3

## 2023-10-08 MED ORDER — METOPROLOL TARTRATE 25 MG PO TABS
25.0000 mg | ORAL_TABLET | Freq: Once | ORAL | Status: AC
  Administered 2023-10-08: 25 mg via ORAL
  Filled 2023-10-08: qty 1

## 2023-10-08 MED ORDER — PROCHLORPERAZINE EDISYLATE 10 MG/2ML IJ SOLN
10.0000 mg | Freq: Four times a day (QID) | INTRAMUSCULAR | Status: DC | PRN
  Administered 2023-10-09 – 2023-10-10 (×3): 10 mg via INTRAVENOUS
  Filled 2023-10-08 (×4): qty 2

## 2023-10-08 MED ORDER — TIZANIDINE HCL 4 MG PO TABS
2.0000 mg | ORAL_TABLET | Freq: Four times a day (QID) | ORAL | Status: DC | PRN
  Administered 2023-10-08 – 2023-10-11 (×9): 2 mg via ORAL
  Filled 2023-10-08 (×9): qty 1

## 2023-10-08 MED ORDER — OXYCODONE HCL 5 MG PO TABS
15.0000 mg | ORAL_TABLET | Freq: Four times a day (QID) | ORAL | Status: AC | PRN
  Administered 2023-10-08 – 2023-10-09 (×4): 15 mg via ORAL
  Filled 2023-10-08 (×5): qty 3

## 2023-10-08 MED ORDER — SODIUM CHLORIDE 0.9 % IV BOLUS
1000.0000 mL | Freq: Once | INTRAVENOUS | Status: AC
  Administered 2023-10-08: 1000 mL via INTRAVENOUS

## 2023-10-08 MED ORDER — TRAZODONE HCL 50 MG PO TABS
300.0000 mg | ORAL_TABLET | Freq: Every day | ORAL | Status: DC
  Administered 2023-10-08 – 2023-10-10 (×3): 300 mg via ORAL
  Filled 2023-10-08 (×3): qty 6

## 2023-10-08 MED ORDER — PANTOPRAZOLE SODIUM 40 MG IV SOLR
40.0000 mg | Freq: Once | INTRAVENOUS | Status: AC
  Administered 2023-10-08: 40 mg via INTRAVENOUS
  Filled 2023-10-08: qty 10

## 2023-10-08 MED ORDER — ONDANSETRON HCL 4 MG/2ML IJ SOLN
4.0000 mg | Freq: Once | INTRAMUSCULAR | Status: AC
  Administered 2023-10-08: 4 mg via INTRAVENOUS
  Filled 2023-10-08: qty 2

## 2023-10-08 MED ORDER — CARVEDILOL 12.5 MG PO TABS
12.5000 mg | ORAL_TABLET | Freq: Two times a day (BID) | ORAL | Status: DC
  Administered 2023-10-08 – 2023-10-09 (×2): 12.5 mg via ORAL
  Filled 2023-10-08 (×2): qty 1

## 2023-10-08 MED ORDER — MAGNESIUM SULFATE 2 GM/50ML IV SOLN
2.0000 g | Freq: Once | INTRAVENOUS | Status: AC
  Administered 2023-10-08: 2 g via INTRAVENOUS
  Filled 2023-10-08: qty 50

## 2023-10-08 MED ORDER — POTASSIUM CHLORIDE 10 MEQ/100ML IV SOLN
10.0000 meq | INTRAVENOUS | Status: AC
  Administered 2023-10-08: 10 meq via INTRAVENOUS
  Filled 2023-10-08 (×2): qty 100

## 2023-10-08 MED ORDER — POTASSIUM CHLORIDE IN NACL 40-0.9 MEQ/L-% IV SOLN
INTRAVENOUS | Status: AC
  Filled 2023-10-08 (×2): qty 1000

## 2023-10-08 MED ORDER — SUCRALFATE 1 G PO TABS
1.0000 g | ORAL_TABLET | Freq: Three times a day (TID) | ORAL | Status: DC
  Administered 2023-10-08 – 2023-10-11 (×9): 1 g via ORAL
  Filled 2023-10-08 (×9): qty 1

## 2023-10-08 NOTE — ED Triage Notes (Signed)
 Pt states that she has generalized lower abdominal pain with vomiting that began earlier tonight.

## 2023-10-08 NOTE — ED Provider Notes (Signed)
 [name]   CSN: [id number] Arrival date & time: 10/08/23  0209     Patient presents with: Abdominal Pain   [name] is a 53 y.o. female.   53 yo complaints of intractable nausea and vomiting.  Going on for about 7 hours.  Has a history of the same.  She thinks it feels similar.  She has been struggling from back pain.  She has a scheduled surgery coming up.  She feels like her back pain has gotten a bit worse.   Abdominal Pain      Prior to Admission medications   Medication Sig Start Date End Date Taking? Authorizing Provider  acetaminophen  (TYLENOL ) 650 MG CR tablet Take 1,300 mg by mouth as needed for pain.    [provider]  ARIPiprazole  (ABILIFY ) 5 MG tablet Take 1 tablet (5 mg total) by mouth daily. 08/01/20   Lydia Sams, MD  atorvastatin  (LIPITOR) 10 MG tablet Take 10 mg by mouth every evening. 08/26/20   [provider]  atorvastatin  (LIPITOR) 20 MG tablet Take 1 tablet (20 mg total) by mouth daily. 09/28/23     Baclofen 5 MG TABS Take 1 tablet by mouth 2 (two) times daily. 07/10/23   [provider]  clonazePAM (KLONOPIN) 0.5 MG tablet Take 0.5 mg by mouth daily as needed. 07/19/23   [provider]  dicyclomine  (BENTYL ) 20 MG tablet Take 1 tablet (20 mg total) by mouth 2 (two) times daily for 5 days. 07/27/22 08/01/22  Teddi Favors, DO  DULoxetine  (CYMBALTA ) 60 MG capsule Take 60 mg by mouth 2 (two) times daily. 06/26/23   [provider]  FLUoxetine  (PROZAC ) 40 MG capsule Take 1 capsule (40 mg total) by mouth daily. 07/01/20   Angiulli, Everlyn Hockey, PA-C  ibuprofen  (ADVIL ) 200 MG tablet Take 400 mg by mouth 3 (three) times daily as needed for moderate pain.    [provider]  levothyroxine  (SYNTHROID ) 50 MCG tablet Take 50 mcg by mouth daily before breakfast.    [provider]  lidocaine  (LIDODERM ) 5 % Place 1 patch onto the skin daily. Remove  & Discard patch within 12 hours or as directed by MD Patient taking differently: Place 1 patch onto the skin as needed (pain). 07/01/22   Shalhoub, Merrill Abide, MD  LIDOCAINE  EX Apply 1 Application topically as needed (pain). Cream    [provider]  methocarbamol  (ROBAXIN ) 500 MG tablet Take 1 tablet (500 mg total) by mouth 2 (two) times daily. 08/24/22   Elisa Guest, PA-C  metoCLOPramide  (REGLAN ) 5 MG tablet Take 5 mg by mouth 2 (two) times daily. 07/10/22   [provider]  metoprolol  succinate (TOPROL -XL) 25 MG 24 hr tablet Take 1 tablet (25 mg total) by mouth daily. Follow up with your PCP regarding your heart rate and whether this should be continued. 07/28/23 08/27/23  Sonnie Dusky, PA-C  mirtazapine (REMERON) 15 MG tablet Take 15 mg by mouth at bedtime. 07/09/23   [provider]  MOUNJARO  5 MG/0.5ML Pen Inject 5 mg into the skin once a week. 07/10/23   [provider]  multivitamin (RENA-VIT) TABS tablet Take 1 tablet by mouth at bedtime. 08/01/20   Lydia Sams, MD  ondansetron  (ZOFRAN ) 4 MG tablet Take 1 tablet (4 mg total) by mouth every 6 (six) hours. 08/04/22   Janalee Mcmurray, PA-C  ondansetron  (ZOFRAN -ODT) 4 MG disintegrating tablet Take 1 tablet (  4 mg total) by mouth every 8 (eight) hours as needed. 07/08/23   Smoot, Genevive Ket, PA-C  oxyCODONE  (ROXICODONE ) 15 MG immediate release tablet Take 1 tablet (15 mg total) by mouth 4 (four) times daily as needed. 10/04/23     oxyCODONE -acetaminophen  (PERCOCET) 10-325 MG tablet Take 1 tablet by mouth 4 (four) times daily. 07/13/23   [provider]  oxyCODONE -acetaminophen  (PERCOCET) 7.5-325 MG tablet Take 1 tablet by mouth 4 (four) times daily.    [provider]  pantoprazole  (PROTONIX ) 40 MG tablet Take 1 tablet by mouth twice daily 09/21/22   Arlee Bellows, NP  potassium chloride  SA (KLOR-CON  M) 20 MEQ tablet Take 1 tablet (20 mEq total) by mouth daily for 3 days. 07/27/22 07/30/22  Russella Courts A, DO  prazosin  (MINIPRESS ) 2 MG capsule Take 1 capsule (2 mg total) by mouth at bedtime. 08/01/20   Lydia Sams, MD  prazosin  (MINIPRESS ) 5 MG capsule Take 5 mg by mouth at bedtime. 07/19/23   [provider]  pregabalin (LYRICA) 50 MG capsule Take 50 mg by mouth 2 (two) times daily. 07/10/23   [provider]  promethazine  (PHENERGAN ) 25 MG tablet Take 1 tablet (25 mg total) by mouth every 6 (six) hours as needed for nausea or vomiting. 08/10/23   Elisa Guest, PA-C  sucralfate  (CARAFATE ) 1 g tablet Take 1 tablet (1 g total) by mouth with breakfast, with lunch, and with evening meal for 7 days. 07/27/22 08/03/22  Teddi Favors, DO  tirzepatide  (MOUNJARO ) 7.5 MG/0.5ML Pen Inject 7.5 mg into the skin once a week. 09/28/23     tiZANidine  (ZANAFLEX ) 4 MG tablet Take 1 tablet (4 mg total) by mouth every six  hours as needed. 09/06/23     traZODone  (DESYREL ) 150 MG tablet Take 150 mg by mouth at bedtime.    [provider]  trazodone  (DESYREL ) 300 MG tablet Take 300 mg by mouth at bedtime. 06/30/23   [provider]    Allergies: Patient has no known allergies.    Review of Systems  Gastrointestinal:  Positive for abdominal pain.    Updated Vital Signs BP 98/72 (BP Location: Right Arm)   Pulse 100   Temp 98.3 F (36.8 C) (Oral)   Resp 17   Ht 5' 4 (1.626 m)   Wt 94 kg   LMP 06/03/2018 (Approximate) Comment: neg hcg 05/10/20  SpO2 96%   BMI 35.57 kg/m   Physical Exam Vitals and nursing note reviewed.  Constitutional:      General: She is not in acute distress.    Appearance: She is well-developed. She is not diaphoretic.  HENT:     Head: Normocephalic and atraumatic.   Eyes:     Pupils: Pupils are equal, round, and reactive to light.    Cardiovascular:     Rate and Rhythm: Normal rate and regular rhythm.     Heart sounds: No murmur heard.    No friction rub. No gallop.  Pulmonary:     Effort: Pulmonary effort is normal.     Breath  sounds: No wheezing or rales.  Abdominal:     General: There is no distension.     Palpations: Abdomen is soft.     Tenderness: There is no abdominal tenderness.   Musculoskeletal:        General: No tenderness.     Cervical back: Normal range of motion and neck supple.   Skin:    General: Skin is  warm and dry.   Neurological:     Mental Status: She is alert and oriented to person, place, and time.   Psychiatric:        Behavior: Behavior normal.     (all labs ordered are listed, but only abnormal results are displayed) Labs Reviewed  CBC WITH DIFFERENTIAL/PLATELET - Abnormal; Notable for the following components:      Result Value   WBC 13.9 (*)    RBC 5.27 (*)    RDW 18.1 (*)    Neutro Abs 11.5 (*)    Monocytes Absolute 1.1 (*)    Abs Immature Granulocytes 0.08 (*)    All other components within normal limits  COMPREHENSIVE METABOLIC PANEL WITH GFR - Abnormal; Notable for the following components:   Potassium 3.3 (*)    CO2 20 (*)    Glucose, Bld 195 (*)    Creatinine, Ser 1.23 (*)    Total Protein 9.1 (*)    Alkaline Phosphatase 140 (*)    GFR, Estimated 53 (*)    Anion gap 18 (*)    All other components within normal limits  LIPASE, BLOOD  URINALYSIS, ROUTINE W REFLEX MICROSCOPIC  I-STAT CG4 LACTIC ACID, ED    EKG: EKG Interpretation Date/Time:  Tuesday October 08 2023 02:27:03 EDT Ventricular Rate:  133 PR Interval:  136 QRS Duration:  87 QT Interval:  290 QTC Calculation: 432 R Axis:   5  Text Interpretation: Sinus tachycardia RSR' in V1 or V2, right VCD or RVH No significant change since last tracing Confirmed by Albertus Hughs [phone]) on 10/08/2023 2:30:26 AM  Radiology: No results found.   .Critical Care  Performed by: Albertus Hughs, DO Authorized by: Albertus Hughs, DO   Critical care provider statement:    Critical care time (minutes):  35   Critical care time was exclusive of:  Separately billable procedures and treating other patients   Critical  care was time spent personally by me on the following activities:  Development of treatment plan with patient or surrogate, discussions with consultants, evaluation of patient's response to treatment, examination of patient, ordering and review of laboratory studies, ordering and review of radiographic studies, ordering and performing treatments and interventions, pulse oximetry, re-evaluation of patient's condition and review of old charts   Care discussed with: admitting provider      Medications Ordered in the ED  sodium chloride  0.9 % bolus 1,000 mL (0 mLs Intravenous Stopped 10/08/23 0507)  HYDROmorphone  (DILAUDID ) injection 1 mg (1 mg Intravenous Given 10/08/23 0245)  ondansetron  (ZOFRAN ) injection 4 mg (4 mg Intravenous Given 10/08/23 0245)  sodium chloride  0.9 % bolus 1,000 mL (1,000 mLs Intravenous New Bag/Given 10/08/23 0355)  oxyCODONE  (Oxy IR/ROXICODONE ) immediate release tablet 15 mg (15 mg Oral Given 10/08/23 0353)  tiZANidine  (ZANAFLEX ) tablet 4 mg (4 mg Oral Given 10/08/23 0353)  metoprolol  tartrate (LOPRESSOR ) tablet 25 mg (25 mg Oral Given 10/08/23 0538)  sodium chloride  0.9 % bolus 1,000 mL (1,000 mLs Intravenous New Bag/Given 10/08/23 0549)                                    Medical Decision Making Amount and/or Complexity of Data Reviewed Labs: ordered. ECG/medicine tests: ordered.  Risk Prescription drug management. Decision regarding hospitalization.   53 yo F with a chief complaint of nausea and vomiting.  Patient has a history of recurrent episodes of vomiting that brought her to  the ED in the past.  She thinks that pain medicine and Zofran  tend to help the best.  Will give a bolus of IV fluids.  Tachycardic into the 140s on initial exam.  EKG.  Blood work.  Reassess.   Leukocytosis, mild hypokalemia.  AKI.  LFTs and lipase unremarkable.  On reassessment patient is feeling much better.  Heart rate still 115.  Will give a second bolus of IV fluids.  Patient requesting  her home Zanaflex .  Reassess.  Patient is persistently tachycardic, despite a liter and half of IV fluids.  She is continuing to do well otherwise.  Tolerating fluids.  Discussed case with medicine for admission.  The patients results and plan were reviewed and discussed.   Any x-rays performed were independently reviewed by myself.   Differential diagnosis were considered with the presenting HPI.  Medications  sodium chloride  0.9 % bolus 1,000 mL (0 mLs Intravenous Stopped 10/08/23 0507)  HYDROmorphone  (DILAUDID ) injection 1 mg (1 mg Intravenous Given 10/08/23 0245)  ondansetron  (ZOFRAN ) injection 4 mg (4 mg Intravenous Given 10/08/23 0245)  sodium chloride  0.9 % bolus 1,000 mL (1,000 mLs Intravenous New Bag/Given 10/08/23 0355)  oxyCODONE  (Oxy IR/ROXICODONE ) immediate release tablet 15 mg (15 mg Oral Given 10/08/23 0353)  tiZANidine  (ZANAFLEX ) tablet 4 mg (4 mg Oral Given 10/08/23 0353)  metoprolol  tartrate (LOPRESSOR ) tablet 25 mg (25 mg Oral Given 10/08/23 0538)  sodium chloride  0.9 % bolus 1,000 mL (1,000 mLs Intravenous New Bag/Given 10/08/23 0549)    Vitals:   10/08/23 0400 10/08/23 0510 10/08/23 0538 10/08/23 0600  BP: (!) 133/93 (!) 136/108 125/87 98/72  Pulse: (!) 122 (!) 133 (!) 115 100  Resp: 16 20  17   Temp: 98.3 F (36.8 C)     TempSrc: Oral     SpO2: 97% 96%  96%  Weight:      Height:        Final diagnoses:  AKI (acute kidney injury) (HCC)    Admission/ observation were discussed with the admitting physician, patient and/or family and they are comfortable with the plan.       Final diagnoses:  AKI (acute kidney injury) [employer])    ED Discharge Orders     None          Albertus Hughs, DO 10/08/23 [phone]

## 2023-10-08 NOTE — Progress Notes (Signed)
   10/08/23 1439  Assess: MEWS Score  Temp 98.6 F (37 C)  BP 135/89  MAP (mmHg) 100  Resp (!) 23  SpO2 98 %  Assess: MEWS Score  MEWS Temp 0  MEWS Systolic 0  MEWS Pulse 1  MEWS RR 1  MEWS LOC 0  MEWS Score 2  MEWS Score Color Yellow  Assess: if the MEWS score is Yellow or Red  Were vital signs accurate and taken at a resting state? Yes  Does the patient meet 2 or more of the SIRS criteria? No  MEWS guidelines implemented  Yes, yellow  Treat  MEWS Interventions Considered administering scheduled or prn medications/treatments as ordered  Take Vital Signs  Increase Vital Sign Frequency  Yellow: Q2hr x1, continue Q4hrs until patient remains green for 12hrs  Escalate  MEWS: Escalate Yellow: Discuss with charge nurse and consider notifying provider and/or RRT  Notify: Charge Nurse/RN  Name of Charge Nurse/RN Notified Goble Last ,RN  Provider Notification  Provider Name/Title Kaleta Oregon ,MD  Date Provider Notified 10/08/23  Method of Notification Page  Notification Reason Critical Result  Assess: SIRS CRITERIA  SIRS Temperature  0  SIRS Respirations  1  SIRS Pulse 1  SIRS WBC 0  SIRS Score Sum  2

## 2023-10-08 NOTE — ED Notes (Signed)
 Patient given ginger ale.

## 2023-10-08 NOTE — H&P (Signed)
 History and Physical    Patient: [name] ONG:[id number] DOB: Nov 13, 1970 DOA: 10/08/2023 DOS: the patient was seen and examined on 10/08/2023 PCP: Powell Broad, PA-C  Patient coming from: Home  Chief Complaint:  Chief Complaint  Patient presents with   Abdominal Pain   HPI: [name] is a 53 y.o. female with medical history significant of anxiety, depression, PTSD, osteoarthritis, chronic lower back pain, lumbar enthesopathy, bulging lumbar disc, history of cardiac arrest, CAD, cervical DDD, type 2 diabetes, drug-seeking behavior, headaches, hyperlipidemia, hypertension, skin cancer, class II obesity who presented to the emergency department complaints of abdominal pain, nausea and vomiting. Per patient she has vomited about 15 times since yesterday evening. No diarrhea, constipation, melena or hematochezia. No flank pain, dysuria, frequency or hematuria. She denied fever, chills, rhinorrhea, sore throat, wheezing or hemoptysis.  No chest pain, palpitations, diaphoresis, PND, orthopnea or pitting edema of the lower extremities.  No polyuria, polydipsia, polyphagia or blurred vision.   Lab work: CBC showed white count of 13.9 with 83% neutrophils, hemoglobin 14.2 g/dL platelets 440.  Lipase and lactic acid were normal.  CMP showed potassium 3.3 and CO2 of 20 mmol/L with an anion gap of 18.  Glucose under 95, BUN 10 and creatinine 1.23 mg/dL.  The rest of the electrolytes were normal.  LFTs showed a total protein of 9.1 g/dL and alkaline phosphatase of 140 units/L, the rest of the hepatic functions were normal.   ED course: Initial vital signs were temperature 98.4 F, pulse 126, respiration 20, BP 155/102 mmHg O2 sat 96% on room air.  The patient received 2000 mL normal saline bolus, Zanaflex  4 mg p.o. x 1, oxycodone  15 mg p.o. x 1, metoprolol  25 mg p.o. x 1 and hydromorphone  1 mg IVP x 1.  Review of Systems: As mentioned in the history of present illness. All other systems  reviewed and are negative. Past Medical History:  Diagnosis Date   Anxiety    Arthritis    joints ache all over (10/15/2014)   Barrett's esophagus    Bulging lumbar disc    Cardiac arrest (HCC)    Chronic lower back pain    Coronary artery disease    DDD (degenerative disc disease), cervical    Depression    DM (diabetes mellitus) (HCC)    Drug-seeking behavior    Headache    weekly (10/15/2014)   Hyperlipemia    Hypertension    PTSD (post-traumatic stress disorder)    Skin cancer    had them cut off my arms; don't know what kind   Past Surgical History:  Procedure Laterality Date   ABLATION ON ENDOMETRIOSIS  2008   BIOPSY  12/27/2018   Procedure: BIOPSY;  Surgeon: Lindle Rhea, MD;  Location: [facility] ENDOSCOPY;  Service: Gastroenterology;;   BIOPSY  10/05/2021   Procedure: BIOPSY;  Surgeon: Kenney Peacemaker, MD;  Location: [facility] ENDOSCOPY;  Service: Gastroenterology;;   ESOPHAGOGASTRODUODENOSCOPY (EGD) WITH PROPOFOL  N/A 12/27/2018   Procedure: ESOPHAGOGASTRODUODENOSCOPY (EGD) WITH PROPOFOL ;  Surgeon: Lindle Rhea, MD;  Location: [facility] ENDOSCOPY;  Service: Gastroenterology;  Laterality: N/A;   ESOPHAGOGASTRODUODENOSCOPY (EGD) WITH PROPOFOL  N/A 10/05/2021   Procedure: ESOPHAGOGASTRODUODENOSCOPY (EGD) WITH PROPOFOL ;  Surgeon: Kenney Peacemaker, MD;  Location[facility] ENDOSCOPY;  Service: Gastroenterology;  Laterality: N/A;   HEMORRHOID SURGERY  ~ 2002   IR FLUORO GUIDE CV LINE RIGHT  06/14/2020   IR REMOVAL TUN CV CATH W/O FL  06/23/2020   IR US  GUIDE VASC ACCESS RIGHT  06/14/2020  ORIF ANKLE FRACTURE Right 03/28/2020   Procedure: OPEN REDUCTION INTERNAL FIXATION (ORIF) RIGHT BIMALLEOLAR ANKLE FRACTURE;  Surgeon: Osa Blase, MD;  Location:  SURGERY CENTER;  Service: Orthopedics;  Laterality: Right;   Social History:  reports that she has never smoked. She has never used smokeless tobacco. She reports that she does not currently use alcohol . She reports that she does not use  drugs.  No Known Allergies  Family History  Problem Relation Age of Onset   Breast cancer Mother    Diabetes Mother    Breast cancer Maternal Grandmother    Breast cancer Paternal Grandmother    Colon polyps Paternal Grandmother    Colon cancer Neg Hx    Esophageal cancer Neg Hx    Rectal cancer Neg Hx    Stomach cancer Neg Hx     Prior to Admission medications   Medication Sig Start Date End Date Taking? Authorizing Provider  acetaminophen  (TYLENOL ) 650 MG CR tablet Take 1,300 mg by mouth as needed for pain.    [provider]  ARIPiprazole  (ABILIFY ) 5 MG tablet Take 1 tablet (5 mg total) by mouth daily. 08/01/20   Lydia Sams, MD  atorvastatin  (LIPITOR) 10 MG tablet Take 10 mg by mouth every evening. 08/26/20   [provider]  atorvastatin  (LIPITOR) 20 MG tablet Take 1 tablet (20 mg total) by mouth daily. 09/28/23     Baclofen 5 MG TABS Take 1 tablet by mouth 2 (two) times daily. 07/10/23   [provider]  clonazePAM (KLONOPIN) 0.5 MG tablet Take 0.5 mg by mouth daily as needed. 07/19/23   [provider]  dicyclomine  (BENTYL ) 20 MG tablet Take 1 tablet (20 mg total) by mouth 2 (two) times daily for 5 days. 07/27/22 08/01/22  Teddi Favors, DO  DULoxetine  (CYMBALTA ) 60 MG capsule Take 60 mg by mouth 2 (two) times daily. 06/26/23   [provider]  FLUoxetine  (PROZAC ) 40 MG capsule Take 1 capsule (40 mg total) by mouth daily. 07/01/20   Angiulli, Everlyn Hockey, PA-C  ibuprofen  (ADVIL ) 200 MG tablet Take 400 mg by mouth 3 (three) times daily as needed for moderate pain.    [provider]  levothyroxine  (SYNTHROID ) 50 MCG tablet Take 50 mcg by mouth daily before breakfast.    [provider]  lidocaine  (LIDODERM ) 5 % Place 1 patch onto the skin daily. Remove & Discard patch within 12 hours or as directed by MD Patient taking differently: Place 1 patch onto the skin as needed (pain). 07/01/22   Shalhoub, Merrill Abide, MD  LIDOCAINE  EX  Apply 1 Application topically as needed (pain). Cream    [provider]  methocarbamol  (ROBAXIN ) 500 MG tablet Take 1 tablet (500 mg total) by mouth 2 (two) times daily. 08/24/22   Elisa Guest, PA-C  metoCLOPramide  (REGLAN ) 5 MG tablet Take 5 mg by mouth 2 (two) times daily. 07/10/22   [provider]  metoprolol  succinate (TOPROL -XL) 25 MG 24 hr tablet Take 1 tablet (25 mg total) by mouth daily. Follow up with your PCP regarding your heart rate and whether this should be continued. 07/28/23 08/27/23  Sonnie Dusky, PA-C  mirtazapine (REMERON) 15 MG tablet Take 15 mg by mouth at bedtime. 07/09/23   [provider]  MOUNJARO  5 MG/0.5ML Pen Inject 5 mg into the skin once a week. 07/10/23   [provider]  multivitamin (RENA-VIT) TABS tablet Take 1 tablet by mouth at bedtime. 08/01/20   Lydia Sams,  Cesario Collum, MD  ondansetron  (ZOFRAN ) 4 MG tablet Take 1 tablet (4 mg total) by mouth every 6 (six) hours. 08/04/22   Robinson, John K, PA-C  ondansetron  (ZOFRAN -ODT) 4 MG disintegrating tablet Take 1 tablet (4 mg total) by mouth every 8 (eight) hours as needed. 07/08/23   Smoot, Genevive Ket, PA-C  oxyCODONE  (ROXICODONE ) 15 MG immediate release tablet Take 1 tablet (15 mg total) by mouth 4 (four) times daily as needed. 10/04/23     oxyCODONE -acetaminophen  (PERCOCET) 10-325 MG tablet Take 1 tablet by mouth 4 (four) times daily. 07/13/23   [provider]  oxyCODONE -acetaminophen  (PERCOCET) 7.5-325 MG tablet Take 1 tablet by mouth 4 (four) times daily.    [provider]  pantoprazole  (PROTONIX ) 40 MG tablet Take 1 tablet by mouth twice daily 09/21/22   Arlee Bellows, NP  potassium chloride  SA (KLOR-CON  M) 20 MEQ tablet Take 1 tablet (20 mEq total) by mouth daily for 3 days. 07/27/22 07/30/22  Russella Courts A, DO  prazosin  (MINIPRESS ) 2 MG capsule Take 1 capsule (2 mg total) by mouth at bedtime. 08/01/20   Lydia Sams, MD  prazosin  (MINIPRESS ) 5 MG capsule Take 5 mg by  mouth at bedtime. 07/19/23   [provider]  pregabalin (LYRICA) 50 MG capsule Take 50 mg by mouth 2 (two) times daily. 07/10/23   [provider]  promethazine  (PHENERGAN ) 25 MG tablet Take 1 tablet (25 mg total) by mouth every 6 (six) hours as needed for nausea or vomiting. 08/10/23   Elisa Guest, PA-C  sucralfate  (CARAFATE ) 1 g tablet Take 1 tablet (1 g total) by mouth with breakfast, with lunch, and with evening meal for 7 days. 07/27/22 08/03/22  Teddi Favors, DO  tirzepatide  (MOUNJARO ) 7.5 MG/0.5ML Pen Inject 7.5 mg into the skin once a week. 09/28/23     tiZANidine  (ZANAFLEX ) 4 MG tablet Take 1 tablet (4 mg total) by mouth every six  hours as needed. 09/06/23     traZODone  (DESYREL ) 150 MG tablet Take 150 mg by mouth at bedtime.    [provider]  trazodone  (DESYREL ) 300 MG tablet Take 300 mg by mouth at bedtime. 06/30/23   [provider]    Physical Exam: Vitals:   10/08/23 0510 10/08/23 0538 10/08/23 0600 10/08/23 0700  BP: (!) 136/108 125/87 98/72 102/65  Pulse: (!) 133 (!) 115 100 (!) 108  Resp: 20  17 15   Temp:      TempSrc:      SpO2: 96%  96% 95%  Weight:      Height:       Physical Exam Vitals and nursing note reviewed.  Constitutional:      General: She is awake. She is not in acute distress.    Appearance: She is ill-appearing.  HENT:     Head: Normocephalic.     Nose: No rhinorrhea.     Mouth/Throat:     Mouth: Mucous membranes are dry.   Eyes:     General: No scleral icterus.    Pupils: Pupils are equal, round, and reactive to light.   Neck:     Vascular: No JVD.   Cardiovascular:     Rate and Rhythm: Normal rate and regular rhythm.     Heart sounds: S1 normal and S2 normal.  Pulmonary:     Effort: Pulmonary effort is normal.     Breath sounds: Normal breath sounds. No wheezing, rhonchi or rales.  Abdominal:  General: Bowel sounds are increased. There is no distension.     Palpations: Abdomen is soft.      Tenderness: There is no abdominal tenderness. There is no right CVA tenderness or left CVA tenderness.   Musculoskeletal:     Cervical back: Neck supple.     Right lower leg: No edema.     Left lower leg: No edema.   Skin:    General: Skin is warm and dry.   Neurological:     General: No focal deficit present.     Mental Status: She is alert and oriented to person, place, and time.   Psychiatric:        Mood and Affect: Mood normal.        Behavior: Behavior normal. Behavior is cooperative.     Data Reviewed:  Results are pending, will review when available.  EKG: Vent. rate 133 BPM PR interval 136 ms   QRS duration 87 ms QT/QTcB 290/432 ms P-R-T axes 18 5 92 Sinus tachycardia RSR' in V1 or V2, right VCD or RVH  Assessment and Plan: Principal Problem:   Nausea with vomiting Leading to:   AKI (acute kidney injury) (HCC) Observation/telemetry. Avoid hypotension. Avoid nephrotoxins. Monitor intake and output. Monitor renal function electrolytes.  Active Problems:   Hyperproteinemia Secondary to volume depletion.    Hypokalemia Replacing.   Follow level in the morning.    Hypomagnesemia Replacing. Follow level as needed.    Barrett's esophagus Pantoprazole  IV given. May resume pantoprazole  p.o. tomorrow    Essential hypertension Carvedilol  12.5 mg p.o. twice daily.    Bipolar affective disorder (HCC)   PTSD (post-traumatic stress disorder)   History of depression Continue Abilify  5 mg p.o. daily.    Aortic atherosclerosis (HCC) On atorvastatin .    Hypocalcemia Recheck calcium  level in AM. Further workup depending on results.    Hypothyroidism Continue levothyroxine  50 mcg p.o. daily.    Advance Care Planning:   Code Status: Full Code   Consults:   Family Communication:   Severity of Illness: The appropriate patient status for this patient is OBSERVATION. Observation status is judged to be reasonable and necessary in order to provide  the required intensity of service to ensure the patient's safety. The patient's presenting symptoms, physical exam findings, and initial radiographic and laboratory data in the context of their medical condition is felt to place them at decreased risk for further clinical deterioration. Furthermore, it is anticipated that the patient will be medically stable for discharge from the hospital within 2 midnights of admission.   Author: Danice Dural, MD 10/08/2023 8:28 AM  For on call review [email or web address].   This document was prepared using Dragon voice recognition software and may contain some unintended transcription errors.

## 2023-10-09 DIAGNOSIS — Z8674 Personal history of sudden cardiac arrest: Secondary | ICD-10-CM | POA: Diagnosis not present

## 2023-10-09 DIAGNOSIS — I2510 Atherosclerotic heart disease of native coronary artery without angina pectoris: Secondary | ICD-10-CM | POA: Diagnosis present

## 2023-10-09 DIAGNOSIS — E8809 Other disorders of plasma-protein metabolism, not elsewhere classified: Secondary | ICD-10-CM | POA: Diagnosis present

## 2023-10-09 DIAGNOSIS — K2270 Barrett's esophagus without dysplasia: Secondary | ICD-10-CM

## 2023-10-09 DIAGNOSIS — Z8659 Personal history of other mental and behavioral disorders: Secondary | ICD-10-CM | POA: Diagnosis not present

## 2023-10-09 DIAGNOSIS — E876 Hypokalemia: Secondary | ICD-10-CM | POA: Diagnosis present

## 2023-10-09 DIAGNOSIS — E119 Type 2 diabetes mellitus without complications: Secondary | ICD-10-CM | POA: Diagnosis present

## 2023-10-09 DIAGNOSIS — E039 Hypothyroidism, unspecified: Secondary | ICD-10-CM | POA: Diagnosis present

## 2023-10-09 DIAGNOSIS — N179 Acute kidney failure, unspecified: Secondary | ICD-10-CM | POA: Diagnosis present

## 2023-10-09 DIAGNOSIS — Z79890 Hormone replacement therapy (postmenopausal): Secondary | ICD-10-CM | POA: Diagnosis not present

## 2023-10-09 DIAGNOSIS — Z833 Family history of diabetes mellitus: Secondary | ICD-10-CM | POA: Diagnosis not present

## 2023-10-09 DIAGNOSIS — I10 Essential (primary) hypertension: Secondary | ICD-10-CM | POA: Diagnosis present

## 2023-10-09 DIAGNOSIS — Z85828 Personal history of other malignant neoplasm of skin: Secondary | ICD-10-CM | POA: Diagnosis not present

## 2023-10-09 DIAGNOSIS — R112 Nausea with vomiting, unspecified: Secondary | ICD-10-CM

## 2023-10-09 DIAGNOSIS — Z83719 Family history of colon polyps, unspecified: Secondary | ICD-10-CM | POA: Diagnosis not present

## 2023-10-09 DIAGNOSIS — F319 Bipolar disorder, unspecified: Secondary | ICD-10-CM | POA: Diagnosis present

## 2023-10-09 DIAGNOSIS — F419 Anxiety disorder, unspecified: Secondary | ICD-10-CM | POA: Diagnosis present

## 2023-10-09 DIAGNOSIS — Z6835 Body mass index (BMI) 35.0-35.9, adult: Secondary | ICD-10-CM | POA: Diagnosis not present

## 2023-10-09 DIAGNOSIS — Z7985 Long-term (current) use of injectable non-insulin antidiabetic drugs: Secondary | ICD-10-CM | POA: Diagnosis not present

## 2023-10-09 DIAGNOSIS — I700 Atherosclerosis of aorta: Secondary | ICD-10-CM | POA: Diagnosis present

## 2023-10-09 DIAGNOSIS — E66812 Obesity, class 2: Secondary | ICD-10-CM | POA: Diagnosis present

## 2023-10-09 DIAGNOSIS — E860 Dehydration: Secondary | ICD-10-CM | POA: Diagnosis present

## 2023-10-09 DIAGNOSIS — E785 Hyperlipidemia, unspecified: Secondary | ICD-10-CM | POA: Diagnosis present

## 2023-10-09 DIAGNOSIS — Z803 Family history of malignant neoplasm of breast: Secondary | ICD-10-CM | POA: Diagnosis not present

## 2023-10-09 LAB — CBC
HCT: 34.9 % — ABNORMAL LOW (ref 36.0–46.0)
Hemoglobin: 10.9 g/dL — ABNORMAL LOW (ref 12.0–15.0)
MCH: 27.3 pg (ref 26.0–34.0)
MCHC: 31.2 g/dL (ref 30.0–36.0)
MCV: 87.5 fL (ref 80.0–100.0)
Platelets: 254 10*3/uL (ref 150–400)
RBC: 3.99 MIL/uL (ref 3.87–5.11)
RDW: 18.5 % — ABNORMAL HIGH (ref 11.5–15.5)
WBC: 13.7 10*3/uL — ABNORMAL HIGH (ref 4.0–10.5)
nRBC: 0 % (ref 0.0–0.2)

## 2023-10-09 LAB — COMPREHENSIVE METABOLIC PANEL WITH GFR
ALT: 8 U/L (ref 0–44)
AST: 11 U/L — ABNORMAL LOW (ref 15–41)
Albumin: 3 g/dL — ABNORMAL LOW (ref 3.5–5.0)
Alkaline Phosphatase: 36 U/L — ABNORMAL LOW (ref 38–126)
Anion gap: 9 (ref 5–15)
BUN: <5 mg/dL — ABNORMAL LOW (ref 6–20)
CO2: 21 mmol/L — ABNORMAL LOW (ref 22–32)
Calcium: 8.4 mg/dL — ABNORMAL LOW (ref 8.9–10.3)
Chloride: 107 mmol/L (ref 98–111)
Creatinine, Ser: 0.6 mg/dL (ref 0.44–1.00)
GFR, Estimated: >60 mL/min (ref >60)
Glucose, Bld: 103 mg/dL — ABNORMAL HIGH (ref 70–99)
Potassium: 3.4 mmol/L — ABNORMAL LOW (ref 3.5–5.1)
Sodium: 137 mmol/L (ref 135–145)
Total Bilirubin: 0.2 mg/dL (ref 0.0–1.2)
Total Protein: 5.6 g/dL — ABNORMAL LOW (ref 6.5–8.1)

## 2023-10-09 MED ORDER — CLONAZEPAM 0.5 MG PO TABS
0.5000 mg | ORAL_TABLET | Freq: Two times a day (BID) | ORAL | Status: DC
  Administered 2023-10-09 – 2023-10-11 (×5): 0.5 mg via ORAL
  Filled 2023-10-09 (×5): qty 1

## 2023-10-09 MED ORDER — OXYCODONE HCL 5 MG PO TABS
15.0000 mg | ORAL_TABLET | Freq: Four times a day (QID) | ORAL | Status: DC | PRN
  Administered 2023-10-09 – 2023-10-11 (×7): 15 mg via ORAL
  Filled 2023-10-09 (×8): qty 3

## 2023-10-09 MED ORDER — CARVEDILOL 3.125 MG PO TABS
3.1250 mg | ORAL_TABLET | Freq: Two times a day (BID) | ORAL | Status: DC
  Administered 2023-10-09 – 2023-10-10 (×2): 3.125 mg via ORAL
  Filled 2023-10-09 (×2): qty 1

## 2023-10-09 MED ORDER — POTASSIUM CHLORIDE CRYS ER 20 MEQ PO TBCR
40.0000 meq | EXTENDED_RELEASE_TABLET | Freq: Once | ORAL | Status: AC
  Administered 2023-10-09: 40 meq via ORAL
  Filled 2023-10-09: qty 2

## 2023-10-09 MED ORDER — LACTATED RINGERS IV SOLN: INTRAVENOUS | Status: AC

## 2023-10-09 NOTE — Care Management Obs Status (Signed)
 MEDICARE OBSERVATION STATUS NOTIFICATION   Patient Details  Name: [name] MRN: [id number] Date of Birth: February 07, 1971   Medicare Observation Status Notification Given:  Yes    Amaryllis Junior, LCSW 10/09/2023, 1:17 PM

## 2023-10-09 NOTE — Progress Notes (Signed)
   10/09/23 1149  TOC Brief Assessment  Insurance and Status Reviewed  Patient has primary care physician Yes  Home environment has been reviewed home w/ spouse  Prior level of function: independent  Prior/Current Home Services No current home services  Social Drivers of Health Review SDOH reviewed no interventions necessary  Readmission risk has been reviewed Yes  Transition of care needs no transition of care needs at this time

## 2023-10-09 NOTE — Progress Notes (Signed)
 [facility]                                                                              [name], is a 53 y.o. female, DOB - 24-Sep-1970, ACZ:[id number] Admit date - 10/08/2023    Outpatient Primary MD for the patient is Schlappi, Liddie Reel, PA-C  LOS - 0  days  Chief Complaint  Patient presents with   Abdominal Pain       Brief summary   Patient is a 53 year old female with anxiety, depression, PTSD, osteoarthritis, chronic low back pain, lumbar enthesiopathy, CAD, history of cardiac arrest, cervical DDD, diabetes mellitus, HTN, HLP presented with abdominal pain nausea and vomiting.  Patient reported that she had vomited about 15 times since a day before the admission, no diarrhea constipation melena or hematochezia.  No fever chills, chest pain, palpitations.  In ED, labs showed WBCs 13.9, lipase, lactic acid normal.  Potassium 3.3, CO2 20, anion gap 18, creatinine 1.2.  Received IV fluid hydration, pain medications and was admitted for further workup.  Assessment & Plan    Principal Problem:   AKI (acute kidney injury) (HCC) -Creatinine 1.23 on admission, likely prerenal due to nausea vomiting and dehydration -Placed on IV fluid hydration, creatinine improving, 0.6 today   Active Problems: Hypokalemia - Replaced  Intractable nausea and vomiting - No ongoing vomiting, states she still has nausea, tolerating clear liquid diet. - Continue IV PPI - Continue IV Reglan  10 mg every 8 hours, IV fluids, IV Compazine  as needed - Advance diet to full liquids    Barrett's esophagus -Continue IV PPI, Carafate   History of T12 compression fracture, chronic back pain - CT on 09/25/2023 had shown compression fracture of T12 with retropulsion - Per patient, she is following neurosurgery outpatient and has planned laminectomy on 11/06/2023 with Dr. Waymond Hailey, neurosurgery  Essential hypertension - BP currently soft, decreased Coreg  to 3.125 mg twice  daily   Bipolar ds, PTSD, depression - Continue Abilify    Aortic atherosclerosis - Continue statin  Hypothyroidism - Continue levothyroxine    Obesity class II Estimated body mass index is 35.57 kg/m as calculated from the following:   Height as of this encounter: 5' 4 (1.626 m).   Weight as of this encounter: 94 kg.  Code Status: Full code DVT Prophylaxis:  enoxaparin  (LOVENOX ) injection 40 mg Start: 10/08/23 2200   Level of Care: Level of care: Telemetry Family Communication: Updated patient Disposition Plan:      Remains inpatient appropriate:      Procedures:    Consultants:     Antimicrobials:   Anti-infectives (From admission, onward)    None          Medications  ARIPiprazole   5 mg Oral Daily   atorvastatin   20 mg Oral Daily   carvedilol   3.125 mg Oral BID WC   clonazePAM  0.5 mg Oral BID   dicyclomine   20 mg Oral BID   DULoxetine   60 mg Oral BID   enoxaparin  (LOVENOX ) injection  40 mg Subcutaneous Q24H   levothyroxine   50 mcg Oral QAC breakfast   metoCLOPramide  (REGLAN ) injection  10 mg Intravenous  Q8H   sucralfate   1 g Oral TID with meals   trazodone   300 mg Oral QHS      Subjective:   [name] was seen and examined today.  States nauseous but tolerating clear liquid diet.  No acute ongoing vomiting, abdominal pain, fever chills, chest pain.    Objective:   Vitals:   10/08/23 1800 10/08/23 2214 10/09/23 0352 10/09/23 1345  BP: (!) 141/88 (!) 142/90 (!) 158/107 93/65  Pulse: 96 (!) 106 (!) 102 83  Resp: 18 18 18 16   Temp: 98 F (36.7 C) 99.1 F (37.3 C) 98.6 F (37 C) 98.2 F (36.8 C)  TempSrc: Oral Oral Oral Oral  SpO2: (!) 89% 99% 100% 95%  Weight:      Height:        Intake/Output Summary (Last 24 hours) at 10/09/2023 1543 Last data filed at 10/09/2023 1532 Gross per 24 hour  Intake 3829.43 ml  Output 1800 ml  Net 2029.43 ml     Wt Readings from Last 3 Encounters:  10/08/23 94 kg  08/10/23 93.4 kg  07/08/23  99.8 kg     Exam General: Alert and oriented x 3, NAD Cardiovascular: S1 S2 auscultated,  RRR Respiratory: Clear to auscultation bilaterally, no wheezing Gastrointestinal: Soft, nontender, nondistended, + bowel sounds Ext: no pedal edema bilaterally Neuro: no new deficits Psych: Normal affect     Data Reviewed:  I have personally reviewed following labs    CBC Lab Results  Component Value Date   WBC 13.7 (H) 10/09/2023   RBC 3.99 10/09/2023   HGB 10.9 (L) 10/09/2023   HCT 34.9 (L) 10/09/2023   MCV 87.5 10/09/2023   MCH 27.3 10/09/2023   PLT 254 10/09/2023   MCHC 31.2 10/09/2023   RDW 18.5 (H) 10/09/2023   LYMPHSABS 1.1 10/08/2023   MONOABS 1.1 (H) 10/08/2023   EOSABS 0.0 10/08/2023   BASOSABS 0.1 10/08/2023     Last metabolic panel Lab Results  Component Value Date   NA 137 10/09/2023   K 3.4 (L) 10/09/2023   CL 107 10/09/2023   CO2 21 (L) 10/09/2023   BUN <5 (L) 10/09/2023   CREATININE 0.60 10/09/2023   GLUCOSE 103 (H) 10/09/2023   GFRNONAA >60 10/09/2023   GFRAA >60 05/05/2019   CALCIUM  8.4 (L) 10/09/2023   PHOS 4.2 10/08/2023   PROT 5.6 (L) 10/09/2023   ALBUMIN  3.0 (L) 10/09/2023   LABGLOB 3.8 01/12/2015   AGRATIO 0.8 01/12/2015   BILITOT 0.2 10/09/2023   ALKPHOS 36 (L) 10/09/2023   AST 11 (L) 10/09/2023   ALT 8 10/09/2023   ANIONGAP 9 10/09/2023    CBG (last 3)  No results for input(s): GLUCAP in the last 72 hours.    Coagulation Profile: No results for input(s): INR, PROTIME in the last 168 hours.   Radiology Studies: I have personally reviewed the imaging studies  DG Chest Port 1 View Result Date: 10/08/2023 CLINICAL DATA:  Acute respiratory failure EXAM: PORTABLE CHEST 1 VIEW COMPARISON:  Chest radiograph dated 07/27/2022 FINDINGS: Asymmetric elevation of the right hemidiaphragm. Normal lung volumes. Hazy right lower lung and patchy left basilar opacity. No pleural effusion or pneumothorax. Similar mildly enlarged cardiomediastinal  silhouette. No acute osseous abnormality. IMPRESSION: Hazy right lower lung and patchy left basilar opacity, which may represent atelectasis, aspiration, or pneumonia. Electronically Signed   By: Limin  Xu M.D.   On: 10/08/2023 13:44       Osric Klopf M.D. [facility] 10/09/2023, 3:43 PM  Available via Epic secure chat 7am-7pm After 7 pm, please refer to night coverage provider listed on amion.

## 2023-10-09 NOTE — Evaluation (Signed)
 Physical Therapy Evaluation Patient Details Name: [name] MRN: [id number] DOB: [birth year] Today's Date: 10/09/2023  History of Present Illness  53 y.o. female who presented to the emergency department complaints of abdominal pain, nausea and vomiting. admitted with AKI. medical history significant of anxiety, depression, PTSD, osteoarthritis, chronic lower back pain, lumbar enthesopathy, bulging lumbar disc, history of cardiac arrest, CAD, cervical DDD, type 2 diabetes, drug-seeking behavior, headaches, hyperlipidemia, hypertension, skin cancer, class II obesity  Clinical Impression  Patient evaluated by Physical Therapy with no further acute PT needs identified. All education has been completed and the patient has no further questions.  Pt agreeable to sit EOB, has been getting up to [facility]; declined amb d/t pain however pt feels she is mobilizing at her baseline. Encouraged pt to continue to mobilize as able with nursing staff and/or mobility team.    Reviewed back precautions, proper sleeping positions and support as pt has a back surgery planned for July 16th.  Pt appreciative. No further PT indicated at this time   See below for any follow-up Physical Therapy or equipment needs. PT is signing off. Thank you for this referral.         If plan is discharge home, recommend the following: Help with stairs or ramp for entrance;Assist for transportation;Assistance with cooking/housework   Can travel by private vehicle        Equipment Recommendations None recommended by PT  Recommendations for Other Services       Functional Status Assessment Patient has not had a recent decline in their functional status     Precautions / Restrictions Precautions Precautions: Fall Restrictions Weight Bearing Restrictions Per Provider Order: No      Mobility  Bed Mobility Overal bed mobility: Modified Independent             General bed mobility comments: no physical assist, bed  flat. reviewed log roll technique and proper sleeping position/support  as pt reports back surgery planned for  July 16th    Transfers                   General transfer comment: pt defers OOB d/t pain; pt reports she has been up to [facility]    Ambulation/Gait               General Gait Details: pt defers d/t pain however reports she feels her mobility to be at baseline and will mobilize when pain improves  Stairs            Wheelchair Mobility     Tilt Bed    Modified Rankin (Stroke Patients Only)       Balance Overall balance assessment: No apparent balance deficits (not formally assessed), History of Falls (denies recent falls)                                           Pertinent Vitals/Pain Pain Assessment Pain Assessment: Faces Faces Pain Scale: Hurts even more Pain Location: back Pain Descriptors / Indicators: Aching, Discomfort, Sore Pain Intervention(s): Limited activity within patient's tolerance, Monitored during session, Premedicated before session    Home Living Family/patient expects to be discharged to:: Private residence Living Arrangements: Spouse/significant other Available Help at Discharge: Family Type of Home: House Home Access: Stairs to enter Entrance Stairs-Rails: Right Entrance Stairs-Number of Steps: 3-4   Home Layout: One level Home Equipment: Agricultural consultant (  2 wheels)      Prior Function               Mobility Comments: uses RW for longer distance, wears back brace       Extremity/Trunk Assessment   Upper Extremity Assessment Upper Extremity Assessment: Overall WFL for tasks assessed    Lower Extremity Assessment Lower Extremity Assessment: Overall WFL for tasks assessed       Communication   Communication Communication: No apparent difficulties    Cognition Arousal: Alert Behavior During Therapy: WFL for tasks assessed/performed   PT - Cognitive impairments: No apparent  impairments                         Following commands: Intact       Cueing Cueing Techniques: Verbal cues     General Comments General comments (skin integrity, edema, etc.): reviewed back precautions in precautions for upcoming back surgery    Exercises     Assessment/Plan    PT Assessment Patient does not need any further PT services  PT Problem List         PT Treatment Interventions      PT Goals (Current goals can be found in the Care Plan section)  Acute Rehab PT Goals PT Goal Formulation: All assessment and education complete, DC therapy    Frequency       Co-evaluation               AM-PAC PT 6 Clicks Mobility  Outcome Measure Help needed turning from your back to your side while in a flat bed without using bedrails?: None Help needed moving from lying on your back to sitting on the side of a flat bed without using bedrails?: None Help needed moving to and from a bed to a chair (including a wheelchair)?: A Little Help needed standing up from a chair using your arms (e.g., wheelchair or bedside chair)?: A Little Help needed to walk in hospital room?: A Little Help needed climbing 3-5 steps with a railing? : A Little 6 Click Score: 20    End of Session Equipment Utilized During Treatment: Gait belt Activity Tolerance: Patient tolerated treatment well;Patient limited by pain Patient left: in bed;with call bell/phone within reach;with bed alarm set Nurse Communication: Mobility status PT Visit Diagnosis: Other abnormalities of gait and mobility (R26.89)    Time: 1610-9604 PT Time Calculation (min) (ACUTE ONLY): 22 min   Charges:   PT Evaluation $PT Eval Low Complexity: 1 Low   PT General Charges $$ ACUTE PT VISIT: 1 Visit         Tashaun Obey, PT  Acute Rehab Dept [facility]) [phone]  10/09/2023   [facility] 10/09/2023, 12:59 PM

## 2023-10-09 NOTE — Plan of Care (Signed)

## 2023-10-10 DIAGNOSIS — R112 Nausea with vomiting, unspecified: Secondary | ICD-10-CM | POA: Diagnosis not present

## 2023-10-10 DIAGNOSIS — I10 Essential (primary) hypertension: Secondary | ICD-10-CM | POA: Diagnosis not present

## 2023-10-10 DIAGNOSIS — K2270 Barrett's esophagus without dysplasia: Secondary | ICD-10-CM | POA: Diagnosis not present

## 2023-10-10 DIAGNOSIS — N179 Acute kidney failure, unspecified: Secondary | ICD-10-CM | POA: Diagnosis not present

## 2023-10-10 LAB — CBC
HCT: 35 % — ABNORMAL LOW (ref 36.0–46.0)
Hemoglobin: 10.8 g/dL — ABNORMAL LOW (ref 12.0–15.0)
MCH: 26.9 pg (ref 26.0–34.0)
MCHC: 30.9 g/dL (ref 30.0–36.0)
MCV: 87.3 fL (ref 80.0–100.0)
Platelets: 261 10*3/uL (ref 150–400)
RBC: 4.01 MIL/uL (ref 3.87–5.11)
RDW: 18.3 % — ABNORMAL HIGH (ref 11.5–15.5)
WBC: 9.1 10*3/uL (ref 4.0–10.5)
nRBC: 0.2 % (ref 0.0–0.2)

## 2023-10-10 LAB — RENAL FUNCTION PANEL
Albumin: 3.1 g/dL — ABNORMAL LOW (ref 3.5–5.0)
Anion gap: 12 (ref 5–15)
BUN: 5 mg/dL — ABNORMAL LOW (ref 6–20)
CO2: 24 mmol/L (ref 22–32)
Calcium: 8.8 mg/dL — ABNORMAL LOW (ref 8.9–10.3)
Chloride: 103 mmol/L (ref 98–111)
Creatinine, Ser: 0.52 mg/dL (ref 0.44–1.00)
GFR, Estimated: >60 mL/min (ref >60)
Glucose, Bld: 92 mg/dL (ref 70–99)
Phosphorus: 2.9 mg/dL (ref 2.5–4.6)
Potassium: 3.5 mmol/L (ref 3.5–5.1)
Sodium: 139 mmol/L (ref 135–145)

## 2023-10-10 LAB — HIV ANTIBODY (ROUTINE TESTING W REFLEX): HIV Screen 4th Generation wRfx: NONREACTIVE

## 2023-10-10 MED ORDER — CARVEDILOL 6.25 MG PO TABS
6.2500 mg | ORAL_TABLET | Freq: Two times a day (BID) | ORAL | Status: DC
  Administered 2023-10-10 – 2023-10-11 (×2): 6.25 mg via ORAL
  Filled 2023-10-10 (×2): qty 1

## 2023-10-10 NOTE — Progress Notes (Signed)
 [facility]                                                                              [name], is a 53 y.o. female, DOB - 1970-11-06, ZOX:[id number] Admit date - 10/08/2023    Outpatient Primary MD for the patient is Schlappi, Liddie Reel, PA-C  LOS - 1  days  Chief Complaint  Patient presents with   Abdominal Pain       Brief summary   Patient is a 53 year old female with anxiety, depression, PTSD, osteoarthritis, chronic low back pain, lumbar enthesiopathy, CAD, history of cardiac arrest, cervical DDD, diabetes mellitus, HTN, HLP presented with abdominal pain nausea and vomiting.  Patient reported that she had vomited about 15 times since a day before the admission, no diarrhea constipation melena or hematochezia.  No fever chills, chest pain, palpitations.  In ED, labs showed WBCs 13.9, lipase, lactic acid normal.  Potassium 3.3, CO2 20, anion gap 18, creatinine 1.2.  Received IV fluid hydration, pain medications and was admitted for further workup.  Assessment & Plan    Principal Problem:   AKI (acute kidney injury) (HCC) -Creatinine 1.23 on admission, likely prerenal due to nausea vomiting and dehydration -Placed on IV fluid hydration - Creatinine improved, 0.5   Active Problems: Hypokalemia - Replace as.  Intractable nausea and vomiting - No ongoing vomiting, states still feeling nauseous -Continue current management, diet advanced to soft solids -Continue IV PPI, Reglan , fluids, Compazine  as needed    Barrett's esophagus -Continue IV PPI, Carafate   History of T12 compression fracture, chronic back pain - CT on 09/25/2023 had shown compression fracture of T12 with retropulsion - Per patient, she is following neurosurgery outpatient and has planned laminectomy on 11/06/2023 with Dr. Waymond Hailey, neurosurgery  Essential hypertension - Increase Coreg  back to 6.25 mg twice daily (outpatient on 12.5 mg twice daily)   Bipolar ds, PTSD,  depression - Continue Abilify , Klonopin   Aortic atherosclerosis - Continue statin  Hypothyroidism - Continue levothyroxine    Obesity class II Estimated body mass index is 35.57 kg/m as calculated from the following:   Height as of this encounter: 5' 4 (1.626 m).   Weight as of this encounter: 94 kg.  Code Status: Full code DVT Prophylaxis:  enoxaparin  (LOVENOX ) injection 40 mg Start: 10/08/23 2200   Level of Care: Level of care: Telemetry Family Communication: Updated patient Disposition Plan:      Remains inpatient appropriate:      Procedures:    Consultants:     Antimicrobials:   Anti-infectives (From admission, onward)    None          Medications  ARIPiprazole   5 mg Oral Daily   atorvastatin   20 mg Oral Daily   carvedilol   3.125 mg Oral BID WC   clonazePAM  0.5 mg Oral BID   dicyclomine   20 mg Oral BID   DULoxetine   60 mg Oral BID   enoxaparin  (LOVENOX ) injection  40 mg Subcutaneous Q24H   levothyroxine   50 mcg Oral QAC breakfast   metoCLOPramide  (REGLAN ) injection  10 mg Intravenous Q8H   sucralfate   1 g Oral  TID with meals   trazodone   300 mg Oral QHS      Subjective:   [name] was seen and examined today.  States still feeling somewhat nauseous, wants 1 more day to observe.  Diet advanced to soft solids.  No ongoing fever chills, vomiting.   Objective:   Vitals:   10/09/23 2117 10/10/23 0451 10/10/23 0751 10/10/23 1142  BP: 133/82 (!) 153/85 108/85 139/78  Pulse: 98 (!) 101 99 87  Resp: 18 18  18   Temp: 98.3 F (36.8 C) 98.2 F (36.8 C)  98.2 F (36.8 C)  TempSrc: Oral Oral  Oral  SpO2: 96% 93%  94%  Weight:      Height:        Intake/Output Summary (Last 24 hours) at 10/10/2023 1410 Last data filed at 10/09/2023 2025 Gross per 24 hour  Intake 1757.34 ml  Output 900 ml  Net 857.34 ml     Wt Readings from Last 3 Encounters:  10/08/23 94 kg  08/10/23 93.4 kg  07/08/23 99.8 kg   Physical Exam General: Alert and  oriented x 3, NAD Cardiovascular: S1 S2 clear, RRR.  Respiratory: CTAB, no wheezing Gastrointestinal: Soft, nontender, nondistended, NBS Ext: no pedal edema bilaterally Neuro: no new deficits psych: Normal affect      Data Reviewed:  I have personally reviewed following labs    CBC Lab Results  Component Value Date   WBC 9.1 10/10/2023   RBC 4.01 10/10/2023   HGB 10.8 (L) 10/10/2023   HCT 35.0 (L) 10/10/2023   MCV 87.3 10/10/2023   MCH 26.9 10/10/2023   PLT 261 10/10/2023   MCHC 30.9 10/10/2023   RDW 18.3 (H) 10/10/2023   LYMPHSABS 1.1 10/08/2023   MONOABS 1.1 (H) 10/08/2023   EOSABS 0.0 10/08/2023   BASOSABS 0.1 10/08/2023     Last metabolic panel Lab Results  Component Value Date   NA 139 10/10/2023   K 3.5 10/10/2023   CL 103 10/10/2023   CO2 24 10/10/2023   BUN 5 (L) 10/10/2023   CREATININE 0.52 10/10/2023   GLUCOSE 92 10/10/2023   GFRNONAA >60 10/10/2023   GFRAA >60 05/05/2019   CALCIUM  8.8 (L) 10/10/2023   PHOS 2.9 10/10/2023   PROT 5.6 (L) 10/09/2023   ALBUMIN  3.1 (L) 10/10/2023   LABGLOB 3.8 01/12/2015   AGRATIO 0.8 01/12/2015   BILITOT 0.2 10/09/2023   ALKPHOS 36 (L) 10/09/2023   AST 11 (L) 10/09/2023   ALT 8 10/09/2023   ANIONGAP 12 10/10/2023    CBG (last 3)  No results for input(s): GLUCAP in the last 72 hours.    Coagulation Profile: No results for input(s): INR, PROTIME in the last 168 hours.   Radiology Studies: I have personally reviewed the imaging studies  No results found.      Bertram Brocks M.D. [facility] 10/10/2023, 2:10 PM  Available via Epic secure chat 7am-7pm After 7 pm, please refer to night coverage provider listed on amion.

## 2023-10-10 NOTE — Plan of Care (Signed)

## 2023-10-11 ENCOUNTER — Other Ambulatory Visit (HOSPITAL_COMMUNITY): Payer: Self-pay

## 2023-10-11 DIAGNOSIS — E876 Hypokalemia: Secondary | ICD-10-CM

## 2023-10-11 DIAGNOSIS — N179 Acute kidney failure, unspecified: Secondary | ICD-10-CM | POA: Diagnosis not present

## 2023-10-11 DIAGNOSIS — I10 Essential (primary) hypertension: Secondary | ICD-10-CM | POA: Diagnosis not present

## 2023-10-11 DIAGNOSIS — K2270 Barrett's esophagus without dysplasia: Secondary | ICD-10-CM | POA: Diagnosis not present

## 2023-10-11 LAB — CBC
HCT: 36.9 % (ref 36.0–46.0)
Hemoglobin: 11.5 g/dL — ABNORMAL LOW (ref 12.0–15.0)
MCH: 27 pg (ref 26.0–34.0)
MCHC: 31.2 g/dL (ref 30.0–36.0)
MCV: 86.6 fL (ref 80.0–100.0)
Platelets: 288 10*3/uL (ref 150–400)
RBC: 4.26 MIL/uL (ref 3.87–5.11)
RDW: 18.6 % — ABNORMAL HIGH (ref 11.5–15.5)
WBC: 9.6 10*3/uL (ref 4.0–10.5)
nRBC: 0 % (ref 0.0–0.2)

## 2023-10-11 LAB — RENAL FUNCTION PANEL
Albumin: 2.9 g/dL — ABNORMAL LOW (ref 3.5–5.0)
Anion gap: 9 (ref 5–15)
BUN: <5 mg/dL — ABNORMAL LOW (ref 6–20)
CO2: 28 mmol/L (ref 22–32)
Calcium: 8.4 mg/dL — ABNORMAL LOW (ref 8.9–10.3)
Chloride: 101 mmol/L (ref 98–111)
Creatinine, Ser: 0.52 mg/dL (ref 0.44–1.00)
GFR, Estimated: >60 mL/min (ref >60)
Glucose, Bld: 111 mg/dL — ABNORMAL HIGH (ref 70–99)
Phosphorus: 3.3 mg/dL (ref 2.5–4.6)
Potassium: 3.6 mmol/L (ref 3.5–5.1)
Sodium: 138 mmol/L (ref 135–145)

## 2023-10-11 MED ORDER — HYDROMORPHONE HCL 1 MG/ML IJ SOLN
0.5000 mg | Freq: Once | INTRAMUSCULAR | Status: AC
  Administered 2023-10-11: 0.5 mg via INTRAVENOUS
  Filled 2023-10-11: qty 0.5

## 2023-10-11 MED ORDER — HYDROMORPHONE HCL 1 MG/ML IJ SOLN: 0.5000 mg | Freq: Once | INTRAMUSCULAR | Status: DC

## 2023-10-11 MED ORDER — PROMETHAZINE HCL 25 MG PO TABS
25.0000 mg | ORAL_TABLET | Freq: Four times a day (QID) | ORAL | 0 refills | Status: DC | PRN
  Filled 2023-10-11: qty 30, 8d supply, fill #0

## 2023-10-11 MED ORDER — ONDANSETRON 8 MG PO TBDP
8.0000 mg | ORAL_TABLET | Freq: Every day | ORAL | 0 refills | Status: DC | PRN
  Filled 2023-10-11: qty 20, 20d supply, fill #0

## 2023-10-11 MED ORDER — TIZANIDINE HCL 4 MG PO TABS
4.0000 mg | ORAL_TABLET | Freq: Four times a day (QID) | ORAL | 0 refills | Status: DC | PRN
  Filled 2023-10-11: qty 120, 30d supply, fill #0

## 2023-10-11 MED ORDER — METOCLOPRAMIDE HCL 5 MG PO TABS
5.0000 mg | ORAL_TABLET | Freq: Three times a day (TID) | ORAL | 0 refills | Status: AC | PRN
  Filled 2023-10-11: qty 45, 15d supply, fill #0

## 2023-10-11 MED ORDER — LIDOCAINE 5 % EX PTCH
1.0000 | MEDICATED_PATCH | CUTANEOUS | 0 refills | Status: DC
  Filled 2023-10-11: qty 30, 30d supply, fill #0

## 2023-10-11 NOTE — Discharge Summary (Signed)
 Physician Discharge Summary   Patient: [name] MRN: [id number] DOB: Aug 06, 1970  Admit date:     10/08/2023  Discharge date: 10/11/23  Discharge Physician: Bertram Brocks, MD    PCP: Powell Broad, PA-C   Recommendations at discharge:   Outpatient follow-up with PCP in next 1 to 2 weeks  Discharge Diagnoses:    AKI (acute kidney injury) (HCC)   Barrett's esophagus   Hypomagnesemia   Essential hypertension   Hypokalemia   Bipolar affective disorder (HCC)   Hypothyroidism   PTSD (post-traumatic stress disorder)   Nausea with vomiting   History of depression   Aortic atherosclerosis (HCC)   Hypocalcemia     Hospital Course:  Patient is a 53 year old female with anxiety, depression, PTSD, osteoarthritis, chronic low back pain, lumbar enthesiopathy, CAD, history of cardiac arrest, cervical DDD, diabetes mellitus, HTN, HLP presented with abdominal pain nausea and vomiting.  Patient reported that she had vomited about 15 times since a day before the admission, no diarrhea constipation melena or hematochezia.  No fever chills, chest pain, palpitations.   In ED, labs showed WBCs 13.9, lipase, lactic acid normal.  Potassium 3.3, CO2 20, anion gap 18, creatinine 1.2.  Received IV fluid hydration, pain medications and was admitted for further workup.     Assessment and Plan:   AKI (acute kidney injury) (HCC) -Creatinine 1.23 on admission, likely prerenal due to nausea vomiting and dehydration -Placed on IV fluid hydration - Creatinine improved, 0.5   Hypokalemia - Replaced   Intractable nausea and vomiting - Improved, patient now tolerating diet without difficulty.   - Outpatient follow-up with her PCP, continue Phenergan , Reglan  as needed    Barrett's esophagus - Continue PPI, Carafate    History of T12 compression fracture, chronic back pain - CT on 09/25/2023 had shown compression fracture of T12 with retropulsion - Per patient, she is following neurosurgery  outpatient and has planned laminectomy on 11/06/2023 with Dr. Waymond Hailey, neurosurgery   Essential hypertension - Continue Coreg  12.5 mg twice daily     Bipolar ds, PTSD, depression - Continue Abilify , Klonopin    Aortic atherosclerosis - Continue statin   Hypothyroidism - Continue levothyroxine     Obesity class II Estimated body mass index is 35.57 kg/m as calculated from the following:   Height as of this encounter: 5' 4 (1.626 m).   Weight as of this encounter: 94 kg.     Pain control - [place]  Controlled Substance Reporting System database was reviewed. and patient was instructed, not to drive, operate heavy machinery, perform activities at heights, swimming or participation in water  activities or provide baby-sitting services while on Pain, Sleep and Anxiety Medications; until their outpatient Physician has advised to do so again. Also recommended to not to take more than prescribed Pain, Sleep and Anxiety Medications.  Consultants: None Procedures performed: None  Disposition: Home Diet recommendation:  Discharge Diet Orders (From admission, onward)     Start     Ordered   10/11/23 0000  Diet - low sodium heart healthy        10/11/23 0923            DISCHARGE MEDICATION: Allergies as of 10/11/2023   No Known Allergies      Medication List     TAKE these medications    acetaminophen  650 MG CR tablet Commonly known as: TYLENOL  Take 1,300 mg by mouth daily as needed for pain.   ARIPiprazole  10 MG tablet Commonly known as: ABILIFY  Take  10 mg by mouth daily.   atorvastatin  20 MG tablet Commonly known as: LIPITOR Take 1 tablet (20 mg total) by mouth daily.   carvedilol  12.5 MG tablet Commonly known as: COREG  Take 12.5 mg by mouth 2 (two) times daily with a meal.   clonazePAM 0.5 MG tablet Commonly known as: KLONOPIN Take 0.5 mg by mouth 2 (two) times daily.   DULoxetine  60 MG capsule Commonly known as: CYMBALTA  Take 60 mg by mouth 2  (two) times daily.   ibuprofen  200 MG tablet Commonly known as: ADVIL  Take 400 mg by mouth daily as needed for moderate pain (pain score 4-6) or mild pain (pain score 1-3).   levothyroxine  50 MCG tablet Commonly known as: SYNTHROID  Take 50 mcg by mouth daily before breakfast.   lidocaine  5 % Commonly known as: LIDODERM  Place 1 patch onto the skin daily. Remove & Discard patch within 12 hours or as directed by MD What changed:  when to take this reasons to take this additional instructions   metoCLOPramide  5 MG tablet Commonly known as: REGLAN  Take 1 tablet (5 mg total) by mouth every 8 (eight) hours as needed for nausea or vomiting. What changed: when to take this   Mounjaro  7.5 MG/0.5ML Pen Generic drug: tirzepatide  Inject 7.5 mg into the skin once a week. What changed: additional instructions   multivitamin Tabs tablet Take 1 tablet by mouth at bedtime. What changed: when to take this   ondansetron  8 MG disintegrating tablet Commonly known as: ZOFRAN -ODT Take 1 tablet (8 mg total) by mouth daily as needed for nausea or vomiting.   oxyCODONE  15 MG immediate release tablet Commonly known as: ROXICODONE  Take 1 tablet (15 mg total) by mouth 4 (four) times daily as needed. What changed: when to take this   pantoprazole  40 MG tablet Commonly known as: PROTONIX  Take 1 tablet by mouth twice daily What changed:  when to take this reasons to take this   pregabalin 50 MG capsule Commonly known as: LYRICA Take 50 mg by mouth 2 (two) times daily.   Prolia 60 MG/ML Sosy injection Generic drug: denosumab Inject 60 mg into the skin every 6 (six) months.   promethazine  25 MG tablet Commonly known as: PHENERGAN  Take 1 tablet (25 mg total) by mouth every 6 (six) hours as needed for nausea or vomiting.   tiZANidine  4 MG tablet Commonly known as: ZANAFLEX  Take 1 tablet (4 mg total) by mouth every six  hours as needed. What changed:  when to take this reasons to take  this   traZODone  150 MG tablet Commonly known as: DESYREL  Take 150 mg by mouth at bedtime.   trazodone  300 MG tablet Commonly known as: DESYREL  Take 300 mg by mouth at bedtime.        Follow-up Information     Schlappi, Kathryn E, PA-C. Schedule an appointment as soon as possible for a visit in 2 week(s).   Specialty: Physician Assistant Why: for hospital follow-up Contact information: [address] [place] [address] [phone]                Discharge Exam: Cleavon Curls Weights   10/08/23 0259  Weight: 94 kg   S: doing well, tolerating diet without difficulty.  No nausea vomiting or abdominal pain.  Wants to go home.   BP (!) 150/90 (BP Location: Right Arm)   Pulse 86   Temp 98.6 F (37 C) (Oral)   Resp 18   Ht 5' 4 (1.626  m)   Wt 94 kg   LMP 06/03/2018 (Approximate) Comment: neg hcg 05/10/20  SpO2 96%   BMI 35.57 kg/m   Physical Exam General: Alert and oriented x 3, NAD Cardiovascular: S1 S2 clear, RRR.  Respiratory: CTAB, no wheezing, rales or rhonchi Gastrointestinal: Soft, nontender, nondistended, NBS Ext: no pedal edema bilaterally Neuro: no new deficits Psych: Normal affect     Condition at discharge: fair  The results of significant diagnostics from this hospitalization (including imaging, microbiology, ancillary and laboratory) are listed below for reference.   Imaging Studies: DG Chest Port 1 View Result Date: 10/08/2023 CLINICAL DATA:  Acute respiratory failure EXAM: PORTABLE CHEST 1 VIEW COMPARISON:  Chest radiograph dated 07/27/2022 FINDINGS: Asymmetric elevation of the right hemidiaphragm. Normal lung volumes. Hazy right lower lung and patchy left basilar opacity. No pleural effusion or pneumothorax. Similar mildly enlarged cardiomediastinal silhouette. No acute osseous abnormality. IMPRESSION: Hazy right lower lung and patchy left basilar opacity, which may represent atelectasis, aspiration, or pneumonia. Electronically Signed   By:  Limin  Xu M.D.   On: 10/08/2023 13:44    Microbiology: Results for orders placed or performed during the hospital encounter of 07/08/23  Resp panel by RT-PCR (RSV, Flu A&B, Covid) Anterior Nasal Swab     Status: None   Collection Time: 07/08/23 12:44 AM   Specimen: Anterior Nasal Swab  Result Value Ref Range Status   SARS Coronavirus 2 by RT PCR NEGATIVE NEGATIVE Final    Comment: (NOTE) SARS-CoV-2 target nucleic acids are NOT DETECTED.  The SARS-CoV-2 RNA is generally detectable in upper respiratory specimens during the acute phase of infection. The lowest concentration of SARS-CoV-2 viral copies this assay can detect is 138 copies/mL. A negative result does not preclude SARS-Cov-2 infection and should not be used as the sole basis for treatment or other patient management decisions. A negative result may occur with  improper specimen collection/handling, submission of specimen other than nasopharyngeal swab, presence of viral mutation(s) within the areas targeted by this assay, and inadequate number of viral copies(<138 copies/mL). A negative result must be combined with clinical observations, patient history, and epidemiological information. The expected result is Negative.  Fact Sheet for Patients:  BloggerCourse.com  Fact Sheet for Healthcare Providers:  SeriousBroker.it  This test is no t yet approved or cleared by the [place]  FDA and  has been authorized for detection and/or diagnosis of SARS-CoV-2 by FDA under an Emergency Use Authorization (EUA). This EUA will remain  in effect (meaning this test can be used) for the duration of the COVID-19 declaration under Section 564(b)(1) of the Act, 21 U.S.C.section 360bbb-3(b)(1), unless the authorization is terminated  or revoked sooner.       Influenza A by PCR NEGATIVE NEGATIVE Final   Influenza B by PCR NEGATIVE NEGATIVE Final    Comment: (NOTE) The Xpert Xpress  SARS-CoV-2/FLU/RSV plus assay is intended as an aid in the diagnosis of influenza from Nasopharyngeal swab specimens and should not be used as a sole basis for treatment. Nasal washings and aspirates are unacceptable for Xpert Xpress SARS-CoV-2/FLU/RSV testing.  Fact Sheet for Patients: BloggerCourse.com  Fact Sheet for Healthcare Providers: SeriousBroker.it  This test is not yet approved or cleared by the [place]  FDA and has been authorized for detection and/or diagnosis of SARS-CoV-2 by FDA under an Emergency Use Authorization (EUA). This EUA will remain in effect (meaning this test can be used) for the duration of the COVID-19 declaration under Section 564(b)(1) of the Act, 21 U.S.C.  section 360bbb-3(b)(1), unless the authorization is terminated or revoked.     Resp Syncytial Virus by PCR NEGATIVE NEGATIVE Final    Comment: (NOTE) Fact Sheet for Patients: BloggerCourse.com  Fact Sheet for Healthcare Providers: SeriousBroker.it  This test is not yet approved or cleared by the [place]  FDA and has been authorized for detection and/or diagnosis of SARS-CoV-2 by FDA under an Emergency Use Authorization (EUA). This EUA will remain in effect (meaning this test can be used) for the duration of the COVID-19 declaration under Section 564(b)(1) of the Act, 21 U.S.C. section 360bbb-3(b)(1), unless the authorization is terminated or revoked.  Performed at [facility], [address], [place], [place] [address]     Labs: CBC: Recent Labs  Lab 10/08/23 0247 10/09/23 0547 10/10/23 0613 10/11/23 0541  WBC 13.9* 13.7* 9.1 9.6  NEUTROABS 11.5*  --   --   --   HGB 14.2 10.9* 10.8* 11.5*  HCT 45.6 34.9* 35.0* 36.9  MCV 86.5 87.5 87.3 86.6  PLT 380 254 261 288   Basic Metabolic Panel: Recent Labs  Lab 10/08/23 0247 10/08/23 0908 10/09/23 0547  10/10/23 0613 10/11/23 0935  NA 142 144 137 139 138  K 3.3* 3.2* 3.4* 3.5 3.6  CL 104 111 107 103 101  CO2 20* 21* 21* 24 28  GLUCOSE 195* 110* 103* 92 111*  BUN 10 7 <5* 5* <5*  CREATININE 1.23* 0.82 0.60 0.52 0.52  CALCIUM  10.1 8.5* 8.4* 8.8* 8.4*  MG  --  1.8  --   --   --   PHOS  --  4.2  --  2.9 3.3   Liver Function Tests: Recent Labs  Lab 10/08/23 0247 10/09/23 0547 10/10/23 0613 10/11/23 0935  AST 22 11*  --   --   ALT 23 8  --   --   ALKPHOS 140* 36*  --   --   BILITOT 0.9 0.2  --   --   PROT 9.1* 5.6*  --   --   ALBUMIN  4.2 3.0* 3.1* 2.9*   CBG: No results for input(s): GLUCAP in the last 168 hours.  Discharge time spent: greater than 30 minutes.  Signed: Bertram Brocks, MD [facility] 10/11/2023

## 2023-10-11 NOTE — Plan of Care (Signed)
  Problem: Education: Goal: Knowledge of General Education information will improve Description: Including pain rating scale, medication(s)/side effects and non-pharmacologic comfort measures Outcome: Progressing   Problem: Health Behavior/Discharge Planning: Goal: Ability to manage health-related needs will improve Outcome: Progressing   Problem: Clinical Measurements: Goal: Ability to maintain clinical measurements within normal limits will improve Outcome: Progressing Goal: Will remain free from infection Outcome: Progressing Goal: Diagnostic test results will improve Outcome: Progressing Goal: Cardiovascular complication will be avoided Outcome: Progressing   Problem: Nutrition: Goal: Adequate nutrition will be maintained Outcome: Progressing   Problem: Pain Managment: Goal: General experience of comfort will improve and/or be controlled Outcome: Progressing   Problem: Safety: Goal: Ability to remain free from injury will improve Outcome: Progressing   Problem: Skin Integrity: Goal: Risk for impaired skin integrity will decrease Outcome: Progressing   Problem: Education: Goal: Knowledge of General Education information will improve Description: Including pain rating scale, medication(s)/side effects and non-pharmacologic comfort measures Outcome: Progressing   Problem: Health Behavior/Discharge Planning: Goal: Ability to manage health-related needs will improve Outcome: Progressing   Problem: Clinical Measurements: Goal: Ability to maintain clinical measurements within normal limits will improve Outcome: Progressing Goal: Cardiovascular complication will be avoided Outcome: Progressing   Problem: Nutrition: Goal: Adequate nutrition will be maintained Outcome: Progressing   Problem: Elimination: Goal: Will not experience complications related to bowel motility Outcome: Progressing Goal: Will not experience complications related to urinary  retention Outcome: Progressing   Problem: Pain Managment: Goal: General experience of comfort will improve and/or be controlled Outcome: Progressing   Problem: Skin Integrity: Goal: Risk for impaired skin integrity will decrease Outcome: Progressing

## 2023-10-11 NOTE — TOC Transition Note (Signed)
 Transition of Care [facility]) - Discharge Note   Patient Details  Name: [name] MRN: [id number] Date of Birth: [birth year]  [facility] CM/SW Contact:  Loreda Rodriguez, RN Phone Number:[phone]  10/11/2023, 9:39 AM   Clinical Narrative:    Patient with discharge orders. No TOC needs.          Patient Goals and CMS Choice            Discharge Placement                       Discharge Plan and Services Additional resources added to the After Visit Summary for                                       Social Drivers of Health (SDOH) Interventions SDOH Screenings   Food Insecurity: No Food Insecurity (10/08/2023)  Housing: Low Risk  (10/08/2023)  Transportation Needs: No Transportation Needs (10/08/2023)  Utilities: Not At Risk (10/08/2023)  Depression (PHQ2-9): Low Risk  (08/16/2020)  Tobacco Use: Low Risk  (10/08/2023)     Readmission Risk Interventions    06/29/2022   12:45 PM 05/22/2022    9:25 AM 04/27/2022    9:05 AM  Readmission Risk Prevention Plan  Transportation Screening Complete Complete Complete  Medication Review Oceanographer) Complete Complete Complete  PCP or Specialist appointment within 3-5 days of discharge Complete Complete Complete  HRI or Home Care Consult Complete Complete Complete  SW Recovery Care/Counseling Consult Complete Complete Complete  Palliative Care Screening Not Applicable Not Applicable Not Applicable  Skilled Nursing Facility Not Applicable Not Applicable Not Applicable

## 2023-10-11 NOTE — Progress Notes (Signed)
 Discharge instructions given to patient questions asked and answered. Sylvia Everts RN

## 2023-10-14 ENCOUNTER — Other Ambulatory Visit (HOSPITAL_COMMUNITY): Payer: Self-pay

## 2023-10-14 MED ORDER — CLONAZEPAM 0.5 MG PO TABS
0.5000 mg | ORAL_TABLET | Freq: Every day | ORAL | 2 refills | Status: DC
  Filled ????-??-??: fill #0

## 2023-10-16 ENCOUNTER — Other Ambulatory Visit (HOSPITAL_COMMUNITY): Payer: Self-pay

## 2023-10-16 ENCOUNTER — Other Ambulatory Visit: Payer: Self-pay

## 2023-10-16 MED ORDER — PRAZOSIN HCL 5 MG PO CAPS
5.0000 mg | ORAL_CAPSULE | Freq: Every day | ORAL | 2 refills | Status: DC
  Filled 2023-10-16 – 2023-11-24 (×3): qty 90, 90d supply, fill #0

## 2023-10-16 MED ORDER — CLONAZEPAM 0.5 MG PO TABS
0.5000 mg | ORAL_TABLET | Freq: Every day | ORAL | 2 refills | Status: DC | PRN
  Filled 2023-10-16 – 2023-10-21 (×4): qty 30, 30d supply, fill #0

## 2023-10-16 MED ORDER — TRAZODONE HCL 300 MG PO TABS
600.0000 mg | ORAL_TABLET | Freq: Every day | ORAL | 0 refills | Status: DC
  Filled 2023-10-16: qty 100, 50d supply, fill #0
  Filled 2023-10-16: qty 180, 90d supply, fill #0

## 2023-10-16 MED ORDER — ARIPIPRAZOLE 10 MG PO TABS
10.0000 mg | ORAL_TABLET | Freq: Every day | ORAL | 1 refills | Status: DC
  Filled 2023-10-16 – 2023-11-24 (×3): qty 90, 90d supply, fill #0

## 2023-10-16 MED ORDER — DULOXETINE HCL 60 MG PO CPEP
60.0000 mg | ORAL_CAPSULE | Freq: Two times a day (BID) | ORAL | 1 refills | Status: DC
  Filled 2023-10-16 – 2024-02-19 (×3): qty 180, 90d supply, fill #0

## 2023-10-17 ENCOUNTER — Other Ambulatory Visit (HOSPITAL_COMMUNITY): Payer: Self-pay

## 2023-10-21 ENCOUNTER — Other Ambulatory Visit (HOSPITAL_COMMUNITY): Payer: Self-pay

## 2023-10-22 ENCOUNTER — Other Ambulatory Visit (HOSPITAL_COMMUNITY): Payer: Self-pay

## 2023-10-22 ENCOUNTER — Encounter: Payer: Self-pay | Admitting: Internal Medicine

## 2023-10-22 MED ORDER — MOUNJARO 7.5 MG/0.5ML ~~LOC~~ SOAJ
7.5000 mg | SUBCUTANEOUS | 1 refills | Status: DC
  Filled 2023-10-22: qty 2, 28d supply, fill #0
  Filled 2023-11-24: qty 2, 28d supply, fill #1

## 2023-10-22 MED ORDER — TIZANIDINE HCL 4 MG PO TABS
4.0000 mg | ORAL_TABLET | Freq: Four times a day (QID) | ORAL | 1 refills | Status: DC
  Filled 2023-10-22 – 2023-11-04 (×4): qty 120, 30d supply, fill #0
  Filled 2023-11-24 – 2023-11-25 (×4): qty 120, 30d supply, fill #1
  Filled ????-??-??: fill #1

## 2023-10-23 NOTE — Pre-Procedure Instructions (Signed)
 Surgical Instructions   Your procedure is scheduled on November 06, 2023. Report to [facility] Main Entrance A at 9:40 A.M., then check in with the Admitting office. Any questions or running late day of surgery: call [phone]  Questions prior to your surgery date: call [phone], Monday-Friday, 8am-4pm. If you experience any cold or flu symptoms such as cough, fever, chills, shortness of breath, etc. between now and your scheduled surgery, please notify us  at the above number.     Remember:  Do not eat after midnight the night before your surgery  You may drink clear liquids until 8:40 AM the morning of your surgery.   Clear liquids allowed are: Water , Non-Citrus Juices (without pulp), Carbonated Beverages, Clear Tea (no milk, honey, etc.), Black Coffee Only (NO MILK, CREAM OR POWDERED CREAMER of any kind), and Gatorade.    Take these medicines the morning of surgery with A SIP OF WATER : ARIPiprazole  (ABILIFY )  atorvastatin  (LIPITOR)  baclofen (LIORESAL)  carvedilol  (COREG )  DULoxetine  (CYMBALTA )  levothyroxine  (SYNTHROID )  oxyCODONE  (ROXICODONE )  pantoprazole  (PROTONIX )  pregabalin (LYRICA)    May take these medicines IF NEEDED: acetaminophen  (TYLENOL )  clonazePAM  (KLONOPIN )  metoCLOPramide  (REGLAN )  ondansetron  (ZOFRAN -ODT)  promethazine  (PHENERGAN )  tiZANidine  (ZANAFLEX )    One week prior to surgery, STOP taking any Aspirin (unless otherwise instructed by your surgeon) Aleve , Naproxen , Ibuprofen , Motrin , Advil , Goody's, BC's, all herbal medications, fish oil, and non-prescription vitamins.   WHAT DO I DO ABOUT MY DIABETES MEDICATION?   STOP taking your tirzepatide  (MOUNJARO ) one week prior to surgery. DO NOT take any doses after July 8th.   HOW TO MANAGE YOUR DIABETES BEFORE AND AFTER SURGERY  Why is it important to control my blood sugar before and after surgery? Improving blood sugar levels before and after surgery helps healing and can limit problems. A  way of improving blood sugar control is eating a healthy diet by:  Eating less sugar and carbohydrates  Increasing activity/exercise  Talking with your doctor about reaching your blood sugar goals High blood sugars (greater than 180 mg/dL) can raise your risk of infections and slow your recovery, so you will need to focus on controlling your diabetes during the weeks before surgery. Make sure that the doctor who takes care of your diabetes knows about your planned surgery including the date and location.  How do I manage my blood sugar before surgery? Check your blood sugar at least 4 times a day, starting 2 days before surgery, to make sure that the level is not too high or low.  Check your blood sugar the morning of your surgery when you wake up and every 2 hours until you get to the Short Stay unit.  If your blood sugar is less than 70 mg/dL, you will need to treat for low blood sugar: Do not take insulin . Treat a low blood sugar (less than 70 mg/dL) with  cup of clear juice (cranberry or apple), 4 glucose tablets, OR glucose gel. Recheck blood sugar in 15 minutes after treatment (to make sure it is greater than 70 mg/dL). If your blood sugar is not greater than 70 mg/dL on recheck, call [phone] for further instructions. Report your blood sugar to the short stay nurse when you get to Short Stay.  If you are admitted to the hospital after surgery: Your blood sugar will be checked by the staff and you will probably be given insulin  after surgery (instead of oral diabetes medicines) to make sure you have good blood sugar levels.  The goal for blood sugar control after surgery is 80-180 mg/dL.                      Do NOT Smoke (Tobacco/Vaping) for 24 hours prior to your procedure.  If you use a CPAP at night, you may bring your mask/headgear for your overnight stay.   You will be asked to remove any contacts, glasses, piercing's, hearing aid's, dentures/partials prior to surgery.  Please bring cases for these items if needed.    Patients discharged the day of surgery will not be allowed to drive home, and someone needs to stay with them for 24 hours.  SURGICAL WAITING ROOM VISITATION Patients may have no more than 2 support people in the waiting area - these visitors may rotate.   Pre-op nurse will coordinate an appropriate time for 1 ADULT support person, who may not rotate, to accompany patient in pre-op.  Children under the age of 27 must have an adult with them who is not the patient and must remain in the main waiting area with an adult.  If the patient needs to stay at the hospital during part of their recovery, the visitor guidelines for inpatient rooms apply.  Please refer to the [facility] website for the visitor guidelines for any additional information.   If you received a COVID test during your pre-op visit  it is requested that you wear a mask when out in public, stay away from anyone that may not be feeling well and notify your surgeon if you develop symptoms. If you have been in contact with anyone that has tested positive in the last 10 days please notify you surgeon.      Pre-operative 5 CHG Bathing Instructions   You can play a key role in reducing the risk of infection after surgery. Your skin needs to be as free of germs as possible. You can reduce the number of germs on your skin by washing with CHG (chlorhexidine  gluconate) soap before surgery. CHG is an antiseptic soap that kills germs and continues to kill germs even after washing.   DO NOT use if you have an allergy  to chlorhexidine /CHG or antibacterial soaps. If your skin becomes reddened or irritated, stop using the CHG and notify one of our RNs at [phone].   Please shower with the CHG soap starting 4 days before surgery using the following schedule:     Please keep in mind the following:  DO NOT shave, including legs and underarms, starting the day of your first shower.   You  may shave your face at any point before/day of surgery.  Place clean sheets on your bed the day you start using CHG soap. Use a clean washcloth (not used since being washed) for each shower. DO NOT sleep with pets once you start using the CHG.   CHG Shower Instructions:  Wash your face and private area with normal soap. If you choose to wash your hair, wash first with your normal shampoo.  After you use shampoo/soap, rinse your hair and body thoroughly to remove shampoo/soap residue.  Turn the water  OFF and apply about 3 tablespoons (45 ml) of CHG soap to a CLEAN washcloth.  Apply CHG soap ONLY FROM YOUR NECK DOWN TO YOUR TOES (washing for 3-5 minutes)  DO NOT use CHG soap on face, private areas, open wounds, or sores.  Pay special attention to the area where your surgery is being performed.  If you are having back  surgery, having someone wash your back for you may be helpful. Wait 2 minutes after CHG soap is applied, then you may rinse off the CHG soap.  Pat dry with a clean towel  Put on clean clothes/pajamas   If you choose to wear lotion, please use ONLY the CHG-compatible lotions that are listed below.  Additional instructions for the day of surgery: DO NOT APPLY any lotions, deodorants, cologne, or perfumes.   Do not bring valuables to the hospital. [facility] is not responsible for any belongings/valuables. Do not wear nail polish, gel polish, artificial nails, or any other type of covering on natural nails (fingers and toes) Do not wear jewelry or makeup Put on clean/comfortable clothes.  Please brush your teeth.  Ask your nurse before applying any prescription medications to the skin.     CHG Compatible Lotions   Aveeno Moisturizing lotion  Cetaphil Moisturizing Cream  Cetaphil Moisturizing Lotion  Clairol Herbal Essence Moisturizing Lotion, Dry Skin  Clairol Herbal Essence Moisturizing Lotion, Extra Dry Skin  Clairol Herbal Essence Moisturizing Lotion, Normal Skin   Curel Age Defying Therapeutic Moisturizing Lotion with Alpha Hydroxy  Curel Extreme Care Body Lotion  Curel Soothing Hands Moisturizing Hand Lotion  Curel Therapeutic Moisturizing Cream, Fragrance-Free  Curel Therapeutic Moisturizing Lotion, Fragrance-Free  Curel Therapeutic Moisturizing Lotion, Original Formula  Eucerin Daily Replenishing Lotion  Eucerin Dry Skin Therapy Plus Alpha Hydroxy Crme  Eucerin Dry Skin Therapy Plus Alpha Hydroxy Lotion  Eucerin Original Crme  Eucerin Original Lotion  Eucerin Plus Crme Eucerin Plus Lotion  Eucerin TriLipid Replenishing Lotion  Keri Anti-Bacterial Hand Lotion  Keri Deep Conditioning Original Lotion Dry Skin Formula Softly Scented  Keri Deep Conditioning Original Lotion, Fragrance Free Sensitive Skin Formula  Keri Lotion Fast Absorbing Fragrance Free Sensitive Skin Formula  Keri Lotion Fast Absorbing Softly Scented Dry Skin Formula  Keri Original Lotion  Keri Skin Renewal Lotion Keri Silky Smooth Lotion  Keri Silky Smooth Sensitive Skin Lotion  Nivea Body Creamy Conditioning Oil  Nivea Body Extra Enriched Lotion  Nivea Body Original Lotion  Nivea Body Sheer Moisturizing Lotion Nivea Crme  Nivea Skin Firming Lotion  NutraDerm 30 Skin Lotion  NutraDerm Skin Lotion  NutraDerm Therapeutic Skin Cream  NutraDerm Therapeutic Skin Lotion  ProShield Protective Hand Cream  Provon moisturizing lotion  Please read over the following fact sheets that you were given.

## 2023-10-23 NOTE — Progress Notes (Addendum)
 Anesthesia PAT Evaluation:  Case: [id number] Date/Time: 11/06/23 1127   Procedure: LAMINECTOMY WITH POSTERIOR LATERAL ARTHRODESIS LEVEL 4 (Back) - Posterior lateral fusion - T10-T11 - T11-T12 - T12-L1 -  - L1-L2, Laminectomy T12   Anesthesia type: General   Diagnosis: Closed wedge compression fracture of T12 vertebra, initial encounter (HCC) [S22.080A]   Pre-op diagnosis: Closed wedge compression fracture of T12 vertebra   [facility]: [facility] ROOM 20 / [facility]   Surgeons: Joshua Alm Hamilton, MD       DISCUSSION: Patient is a 53 year old female scheduled for the above procedure.   History includes never smoker, HTN, HLD, DM2, cardiac arrest (06/08/20, see below; no significant CV atherosclerosis on CT chest/abd/pelvis 06/25/22), cyclical vomiting syndrome.   On 06/08/20 she was found unresponsive on the floor at home. She had COVID-19 the month before. When EMS arrived she was noted to be pulseless with asystole. CPR was initiated and Epinephrine  x1 with ROSC within 3 minutes. (Subsequent notes suggest resultant anoxic brain injury due to estimated total downtime of around 11 minutes.) She was intubated and transferred to [facility] ED. Post arrest EKG initially concerning for STEMI but later not felt to meet STEMI criteria. Bedside echo showed normal LV systolic function with no regional wall motion abnormalities, so cardiologist Dr. Court opted not to take her to the cath lab. In the ER she was found to have severe acidosis, hypokalemia, acute renal failure, hypothermia, and shock. Drug screen positive for amphetamines but was also on trazodone  which could cause false positive. She required pressors. Creatinine > 18 with K 6.1 and was started on CRRT and transitioned to inpatient HD 06/15/20-06/17/20, and renal function recovered. She completed antibiotic therapy for possible aspiration pneumonia. Extubated 06/12/20. She required short term supplemental tube feeds by NGT. She was discharged home on 07/02/20.   Last  cardiology follow-up was with Dr. Court on 08/09/21. No recurrent cardiac symptoms. On Coreg  then for HTN and statin for HLD. One year follow-up planned. Since then she had an inpatient TTE 06/30/22 during admission for Klebsiella urosepsis that showed LVEF 55-60%, no RWMA, grade 1 DD, normal RV systolic function, normal RV size, severely elevated PASP, estimated RVSP 64.5 mmHg, moderate MR, moderate TR, mild-moderate AR. A 14 day Zio monitor was ordered during 07/11/22 admission for cyclical vomiting with inappropriate ST, but she never completed due to readmission.    She has had 3 ED visits and one admission since March 2025 for either falls or abdominal pain/cyclical vomiting. Most recently admitted 10/08/23-10/11/23 for intractable N/V with AKI (Cr 1.23, K 3.3) and treated with IVF and potassium supplementation.  She is followed by neurosurgeon Dr. Joshua for compression fractures. CT T/L spines on 07/27/23 showed unchanged L3 superior endplate Schmorl's node and chronic wedge compression fracture of L4 and T12 incomplete burst fracture with 25% height loss with 3 mm retropulsion. Repeat CT T-spine on 09/11/23 showed worsening T12 compression fracture with 80% loss of height and 5-6 mm retropulsion encroaching upon the spinal canal. She had been wearing a TLSO brace. She rated her pain as 10/10 at  09/27/23 follow-up. Above procedure scheduled.   I went to speak with patient and her husband during her 10/24/23 PAT visit. Face mask worn. She was in a hospital wheelchair wearing her TLSO brace. She appeared very uncomfortable with frequent facial wincing and repositioning. HR 115 with VS, BP 105/68. Prior to her fall out of bed this Spring, she was able to walk around her house, but  generally had to use a grocery cart for support in the store.  Activity has been very limited since her fall. Her husband says she is almost to the point of needing 24 hour supervision. She confirmed she had not seen Dr. Court in > 1 year and  last echo was in 2024. I discussed that I had reviewed her history with an anesthesiologist Norvell, Jon, DO) on 10/23/23 with recommendation for preoperative cardiology evaluation. He also felt she should have an updated echo given severe pulmonary hypertension on her March 2024 TTE. She also had mild-moderate valvular regurgitation. They told me they were not completely sure the cause of her cardiac arrest (LHC was deferred then). She never wore the cardiac monitor due to readmission shortly after being ordered. As I was talking with her, staff came back in to the room to re-attempt a blood draw. Her last labs were from 10/11/23, but she still required a T&S and A1c. She was very restless during blood draw attempts, and kept moving her arms, repositioning, and facial wincing/grimacing. She also has small veins. After another unsuccessful lab draw attempt I continued to talk with her and her husband, but felt she was tiring out and dozing off, although still easily arousable to voice. She has a history of anoxic brain injury, so I did inquire of her husband if this was along her baseline. He indicated her behavior and interaction was not unusual, particularly while on pain medications. He also thought she was exhibiting signs of an anxiety attack and requested she be taken to the ED for further evaluation. I did not complete evaluation, and instead accompanied then to the ED triage desk at 11:34 AM for further evaluation and management. I did tell them that if she is admitted then he could inquire if cardiology could be consulted for timing of perioperative evaluation and echo (in-patient versus out-patient).    Last Mounjaro  09/30/23.    I updated Shanda at Dr. Tamala office. She is aware that Mrs. Schwimmer will need a preoperative cardiology evaluation and that anesthesiologist is recommended repeat echo. I will send [facility] Preoperative APP a message as well. Chart will be left for follow-up.   ADDENDUM  10/31/23 4:51 PM:  Mrs. Messerschmidt was admitted from the ED on 10/24/23 for acute toxic encephalopathy, suspected to be iatrogenic related to polypharmacy. Per 10/26/23 Discharge Summary, UDS positive for opiate and amphetamine.  Ammonia, UA, CXR and CT head are unrevealing.  VBG normal but obtained late.  TSH is slightly low.  She has some leukocytosis with mild lactic acidosis and tachycardia on presentation. Some question about possible infection/SIRS and started on broad-spectrum antibiotics.  Also question about dehydration and she was started on IV fluid.  Encephalopathy and AKI resolved.  Leukocytosis improved.  CRP elevated to 5.5 with ESR of 47.  MRI thoracic and lumbar spine as above.  Blood cultures NGTD.  Antibiotics discontinued. Cardiology evaluation and echo deferred to out-patient.   She had preoperative cardiology evaluation by Lelon Hamilton, PA-C on 10/29/23. He wrote, Ms. Bortle's perioperative risk of a major cardiac event is 0.9% according to the Revised Cardiac Risk Index (RCRI).  Therefore, she is at low risk for perioperative complications.   Her functional capacity is poor at 3.3 METs according to the Duke Activity Status Index (DASI). Per ACC guidelines, she does not need ischemic evaluation. I reviewed her case with Dr. Court. We both agree that she does not need ischemic evaluation. She does need an echocardiogram to  follow up on valvular heart disease. TTE also to re-evaluate RVSP given severe pulmonary HTN noted on echo in March 2024, but also during acute illness.   10/31/23 TTE showed LVEF 60 to 65%, no regional wall motion abnormalities, normal diastolic parameters, normal RV systolic function, mild to moderate MR, mild TR, RVSP 34.6 mmHg, mild AR.  Echo findings improved from previous.  Repeat echo planned in 1 year to reevaluate valve disease. She was felt to be acceptable risk for surgery.  Anesthesia team to evaluate on the day of surgery.     VS: BP 105/68   Pulse (!) 115    Temp 36.9 C   Resp 17   Ht 5' 4 (1.626 m)   Wt 88.3 kg   LMP 06/03/2018 (Approximate) Comment: neg hcg 05/10/20  SpO2 98%   BMI 33.40 kg/m     PROVIDERS: [name], PA-C is PCP [facility]) Court Carrier, MD is cardiologist Eda Iha, MD is GI   LABS:  UPDATED LABS as of 10/26/23: Lab Results  Component Value Date   WBC 9.6 10/26/2023   HGB 10.1 (L) 10/26/2023   HCT 31.2 (L) 10/26/2023   PLT 250 10/26/2023   GLUCOSE 112 (H) 10/26/2023   TRIG 185 (H) 05/10/2020   ALT 13 10/24/2023   AST 24 10/24/2023   NA 138 10/26/2023   K 3.2 (L) 10/26/2023   CL 103 10/26/2023   CREATININE 0.62 10/26/2023   BUN 5 (L) 10/26/2023   CO2 27 10/26/2023   TSH 0.347 (L) 10/25/2023   INR 1.0 06/26/2022   HGBA1C 5.8 (H) 10/24/2023     OTHER: EEG 06/28/22: IMPRESSION: This study is within normal limits. No seizures or epileptiform discharges were seen throughout the recording.    IMAGES: MRI T/L-spine 10/25/23: IMPRESSION: - Subacute compression fracture of T12 with similar degree of height loss and retropulsion compared to CT on 09/11/2023. Retropulsed fracture fragments contact and deform the ventral thoracic cord with cord compression and signal abnormality concerning for edema versus myelomalacia. Severe spinal canal stenosis noted at T11-12. - Additional chronic compression deformities in the thoracic spine as above. - Acute fracture along the L2 inferior endplate without significant height loss. Minimal retropulsion at the L2 inferior endplate. Additional acute compression fracture of L5 with approximately 10% height loss without retropulsion. - Chronic compression deformities of L3 and L4 with interval height loss at L4 compared to 2024. - Degenerative changes as above. Mild spinal canal stenosis at L3-4 is new from prior. No high-grade foraminal stenosis.   CT Head 10/24/23: IMPRESSION: No CT evidence of acute intracranial abnormality.   1V CXR  10/24/23: FINDINGS: Overlying metallic structure, possibly a external brace. Elevation of the right hemidiaphragm. Streaky atelectasis again noted in the right lung base. No new airspace consolidation, pleural effusion, or pneumothorax. No cardiomegaly.Partially visualized compression fracture in the lower thoracic spine. IMPRESSION: No acute cardiopulmonary abnormality.  CT T-spine 09/11/23: IMPRESSION: 1. Worsening of the compression fracture at T12 now with loss of height anteriorly of 80%. Posterior bowing/retropulsion of 5-6 mm encroaches upon the spinal canal. 2. Old minor superior endplate deformities at T4 and T11.  CT C-spine 07/28/23: IMPRESSION: 1. No acute fracture or subluxation in the cervical spine. 2. Multiple stable, bilateral, predominantly subcentimeter posterior cervical chain lymph nodes. Correlation with physical examination is recommended.   CT T/L-spine 07/27/23: IMPRESSION: 1. T12 incomplete burst fracture with 25% height loss and 3 mm of retropulsion. New since 07/08/2023. 2. Unchanged L3 superior endplate Schmorl's  node and chronic wedge compression fracture of L4.   EKG: EKG 10/29/23: Normal sinus rhythm Minimal voltage criteria for LVH, may be normal variant No significant change since last tracing Confirmed by Lelon Hamilton [phone]) on 10/29/2023 2:24:03 PM  EKG 10/08/23: Sinus tachycardia at 133 bpm RSR' in V1 or V2, right VCD or RVH No significant change since last tracing Confirmed by Emil Share [phone]) on 10/08/2023 2:30:26 AM   CV: Echo 10/31/23: IMPRESSIONS   1. Left ventricular ejection fraction, by estimation, is 60 to 65%. The  left ventricle has normal function. The left ventricle has no regional  wall motion abnormalities. Left ventricular diastolic parameters were  normal.   2. Right ventricular systolic function is normal. The right ventricular  size is normal.   3. The mitral valve is normal in structure. Mild to moderate mitral  valve  regurgitation. No evidence of mitral stenosis.   4. The tricuspid valve is normal in structure.  Tricuspid valve regurgitation is mild. RVSP 34.6 mmHg.  5. The aortic valve is normal in structure. Aortic valve regurgitation is  mild. No aortic stenosis is present.   6. The inferior vena cava is normal in size with greater than 50%  respiratory variability, suggesting right atrial pressure of 3 mmHg.  - Comparison 06/30/22: LVEF 55-60%, no RWMA, grade 1 DD, normal RVSF, severely elevated PASP, RVSP 64.5 mmHg, moderate MR/TR, mild-moderate AR; 06/09/20: LVEF 55-60%, no RWMA, normal RV systolic function, trivial MR/TR, moderate AR   A 14 day long ter Zio monitor was ordered on 07/16/22 for evaluate of inappropriate ST, but it was never done due to re-admission. (Decision made to defer at 10/29/23 cardiology follow-up.)   Past Medical History:  Diagnosis Date   Anxiety    Arthritis    joints ache all over (10/15/2014)   Barrett's esophagus    Bulging lumbar disc    Cardiac arrest (HCC)    Chronic lower back pain    Coronary artery disease    DDD (degenerative disc disease), cervical    Depression    DM (diabetes mellitus) (HCC)    Drug-seeking behavior    Headache    weekly (10/15/2014)   Hyperlipemia    Hypertension    PTSD (post-traumatic stress disorder)    Skin cancer    had them cut off my arms; don't know what kind    Past Surgical History:  Procedure Laterality Date   ABLATION ON ENDOMETRIOSIS  2008   BIOPSY  12/27/2018   Procedure: BIOPSY;  Surgeon: Eda Iha, MD;  Location: [facility] ENDOSCOPY;  Service: Gastroenterology;;   BIOPSY  10/05/2021   Procedure: BIOPSY;  Surgeon: Avram Lupita BRAVO, MD;  Location: [facility] ENDOSCOPY;  Service: Gastroenterology;;   ESOPHAGOGASTRODUODENOSCOPY (EGD) WITH PROPOFOL  N/A 12/27/2018   Procedure: ESOPHAGOGASTRODUODENOSCOPY (EGD) WITH PROPOFOL ;  Surgeon: Eda Iha, MD;  Location: [facility] ENDOSCOPY;  Service: Gastroenterology;  Laterality:  N/A;   ESOPHAGOGASTRODUODENOSCOPY (EGD) WITH PROPOFOL  N/A 10/05/2021   Procedure: ESOPHAGOGASTRODUODENOSCOPY (EGD) WITH PROPOFOL ;  Surgeon: Avram Lupita BRAVO, MD;  Location: [facility] ENDOSCOPY;  Service: Gastroenterology;  Laterality: N/A;   HEMORRHOID SURGERY  ~ 2002   IR FLUORO GUIDE CV LINE RIGHT  06/14/2020   IR REMOVAL TUN CV CATH W/O FL  06/23/2020   IR US  GUIDE VASC ACCESS RIGHT  06/14/2020   ORIF ANKLE FRACTURE Right 03/28/2020   Procedure: OPEN REDUCTION INTERNAL FIXATION (ORIF) RIGHT BIMALLEOLAR ANKLE FRACTURE;  Surgeon: Josefina Chew, MD;  Location: [facility];  Service: Orthopedics;  Laterality:  Right;    MEDICATIONS:  acetaminophen  (TYLENOL ) 650 MG CR tablet   ARIPiprazole  (ABILIFY ) 10 MG tablet   atorvastatin  (LIPITOR) 10 MG tablet   atorvastatin  (LIPITOR) 20 MG tablet   baclofen  (LIORESAL ) 10 MG tablet   carvedilol  (COREG ) 12.5 MG tablet   clonazePAM  (KLONOPIN ) 0.5 MG tablet   clonazePAM  (KLONOPIN ) 0.5 MG tablet   DULoxetine  (CYMBALTA ) 60 MG capsule   ibuprofen  (ADVIL ) 200 MG tablet   levothyroxine  (SYNTHROID ) 50 MCG tablet   lidocaine  (LIDODERM ) 5 %   metoCLOPramide  (REGLAN ) 5 MG tablet   multivitamin (RENA-VIT) TABS tablet   ondansetron  (ZOFRAN -ODT) 8 MG disintegrating tablet   oxyCODONE  (ROXICODONE ) 15 MG immediate release tablet   pantoprazole  (PROTONIX ) 40 MG tablet   prazosin  (MINIPRESS ) 5 MG capsule   pregabalin  (LYRICA ) 50 MG capsule   PROLIA 60 MG/ML SOSY injection   promethazine  (PHENERGAN ) 25 MG tablet   tirzepatide  (MOUNJARO ) 7.5 MG/0.5ML Pen   tirzepatide  (MOUNJARO ) 7.5 MG/0.5ML Pen   tiZANidine  (ZANAFLEX ) 4 MG tablet   tiZANidine  (ZANAFLEX ) 4 MG tablet   traZODone  (DESYREL ) 150 MG tablet   trazodone  (DESYREL ) 300 MG tablet   No current facility-administered medications for this encounter.    Isaiah Ruder, PA-C Surgical Short Stay/Anesthesiology [facility] Phone [phone] [facility] Phone [phone] 10/24/2023 1:12 PM

## 2023-10-24 ENCOUNTER — Encounter (HOSPITAL_COMMUNITY): Payer: Self-pay | Admitting: *Deleted

## 2023-10-24 ENCOUNTER — Emergency Department (HOSPITAL_COMMUNITY)

## 2023-10-24 ENCOUNTER — Inpatient Hospital Stay (HOSPITAL_COMMUNITY)
Admission: EM | Admit: 2023-10-24 | Discharge: 2023-10-26 | DRG: 092 | Disposition: A | Discharge status: Home Health | Attending: Student | Admitting: Student

## 2023-10-24 ENCOUNTER — Encounter (HOSPITAL_COMMUNITY): Payer: Self-pay

## 2023-10-24 ENCOUNTER — Encounter (HOSPITAL_COMMUNITY)
Admission: RE | Admit: 2023-10-24 | Discharge: 2023-10-24 | Disposition: A | Discharge status: Home / Self Care | Source: Ambulatory Visit | Attending: Neurological Surgery | Admitting: Neurological Surgery

## 2023-10-24 ENCOUNTER — Other Ambulatory Visit: Payer: Self-pay

## 2023-10-24 ENCOUNTER — Other Ambulatory Visit (HOSPITAL_COMMUNITY): Payer: Self-pay

## 2023-10-24 VITALS — BP 105/68 | HR 115 | Temp 98.4°F | Resp 17 | Ht 64.0 in | Wt 194.6 lb

## 2023-10-24 DIAGNOSIS — E876 Hypokalemia: Secondary | ICD-10-CM | POA: Diagnosis present

## 2023-10-24 DIAGNOSIS — E669 Obesity, unspecified: Secondary | ICD-10-CM | POA: Diagnosis present

## 2023-10-24 DIAGNOSIS — Z87820 Personal history of traumatic brain injury: Secondary | ICD-10-CM | POA: Insufficient documentation

## 2023-10-24 DIAGNOSIS — E039 Hypothyroidism, unspecified: Secondary | ICD-10-CM | POA: Diagnosis present

## 2023-10-24 DIAGNOSIS — I5032 Chronic diastolic (congestive) heart failure: Secondary | ICD-10-CM | POA: Diagnosis present

## 2023-10-24 DIAGNOSIS — E860 Dehydration: Secondary | ICD-10-CM | POA: Diagnosis present

## 2023-10-24 DIAGNOSIS — Z01812 Encounter for preprocedural laboratory examination: Secondary | ICD-10-CM | POA: Insufficient documentation

## 2023-10-24 DIAGNOSIS — W06XXXA Fall from bed, initial encounter: Secondary | ICD-10-CM | POA: Insufficient documentation

## 2023-10-24 DIAGNOSIS — I272 Pulmonary hypertension, unspecified: Secondary | ICD-10-CM | POA: Diagnosis present

## 2023-10-24 DIAGNOSIS — Z7985 Long-term (current) use of injectable non-insulin antidiabetic drugs: Secondary | ICD-10-CM

## 2023-10-24 DIAGNOSIS — G928 Other toxic encephalopathy: Principal | ICD-10-CM | POA: Diagnosis present

## 2023-10-24 DIAGNOSIS — G929 Unspecified toxic encephalopathy: Secondary | ICD-10-CM | POA: Diagnosis not present

## 2023-10-24 DIAGNOSIS — Z1152 Encounter for screening for COVID-19: Secondary | ICD-10-CM | POA: Diagnosis not present

## 2023-10-24 DIAGNOSIS — M4856XA Collapsed vertebra, not elsewhere classified, lumbar region, initial encounter for fracture: Secondary | ICD-10-CM | POA: Diagnosis present

## 2023-10-24 DIAGNOSIS — G9340 Encephalopathy, unspecified: Secondary | ICD-10-CM | POA: Diagnosis present

## 2023-10-24 DIAGNOSIS — R6510 Systemic inflammatory response syndrome (SIRS) of non-infectious origin without acute organ dysfunction: Secondary | ICD-10-CM | POA: Diagnosis present

## 2023-10-24 DIAGNOSIS — G8929 Other chronic pain: Secondary | ICD-10-CM | POA: Diagnosis present

## 2023-10-24 DIAGNOSIS — E872 Acidosis, unspecified: Secondary | ICD-10-CM | POA: Diagnosis present

## 2023-10-24 DIAGNOSIS — Z8674 Personal history of sudden cardiac arrest: Secondary | ICD-10-CM | POA: Insufficient documentation

## 2023-10-24 DIAGNOSIS — Z79890 Hormone replacement therapy (postmenopausal): Secondary | ICD-10-CM

## 2023-10-24 DIAGNOSIS — G931 Anoxic brain damage, not elsewhere classified: Secondary | ICD-10-CM | POA: Diagnosis present

## 2023-10-24 DIAGNOSIS — S22080A Wedge compression fracture of T11-T12 vertebra, initial encounter for closed fracture: Secondary | ICD-10-CM | POA: Insufficient documentation

## 2023-10-24 DIAGNOSIS — I351 Nonrheumatic aortic (valve) insufficiency: Secondary | ICD-10-CM | POA: Diagnosis present

## 2023-10-24 DIAGNOSIS — Z8719 Personal history of other diseases of the digestive system: Secondary | ICD-10-CM | POA: Insufficient documentation

## 2023-10-24 DIAGNOSIS — S32040A Wedge compression fracture of fourth lumbar vertebra, initial encounter for closed fracture: Secondary | ICD-10-CM | POA: Insufficient documentation

## 2023-10-24 DIAGNOSIS — F39 Unspecified mood [affective] disorder: Secondary | ICD-10-CM | POA: Diagnosis present

## 2023-10-24 DIAGNOSIS — Z6833 Body mass index (BMI) 33.0-33.9, adult: Secondary | ICD-10-CM

## 2023-10-24 DIAGNOSIS — M5442 Lumbago with sciatica, left side: Secondary | ICD-10-CM | POA: Diagnosis not present

## 2023-10-24 DIAGNOSIS — N179 Acute kidney failure, unspecified: Secondary | ICD-10-CM | POA: Diagnosis present

## 2023-10-24 DIAGNOSIS — K2270 Barrett's esophagus without dysplasia: Secondary | ICD-10-CM | POA: Diagnosis present

## 2023-10-24 DIAGNOSIS — S22080G Wedge compression fracture of T11-T12 vertebra, subsequent encounter for fracture with delayed healing: Secondary | ICD-10-CM | POA: Diagnosis not present

## 2023-10-24 DIAGNOSIS — I2510 Atherosclerotic heart disease of native coronary artery without angina pectoris: Secondary | ICD-10-CM | POA: Diagnosis present

## 2023-10-24 DIAGNOSIS — E785 Hyperlipidemia, unspecified: Secondary | ICD-10-CM | POA: Diagnosis present

## 2023-10-24 DIAGNOSIS — R4182 Altered mental status, unspecified: Principal | ICD-10-CM

## 2023-10-24 DIAGNOSIS — F419 Anxiety disorder, unspecified: Secondary | ICD-10-CM | POA: Insufficient documentation

## 2023-10-24 DIAGNOSIS — I110 Hypertensive heart disease with heart failure: Secondary | ICD-10-CM | POA: Diagnosis present

## 2023-10-24 DIAGNOSIS — I10 Essential (primary) hypertension: Secondary | ICD-10-CM | POA: Insufficient documentation

## 2023-10-24 DIAGNOSIS — T4275XA Adverse effect of unspecified antiepileptic and sedative-hypnotic drugs, initial encounter: Secondary | ICD-10-CM | POA: Diagnosis present

## 2023-10-24 DIAGNOSIS — M8588 Other specified disorders of bone density and structure, other site: Secondary | ICD-10-CM | POA: Diagnosis present

## 2023-10-24 DIAGNOSIS — E119 Type 2 diabetes mellitus without complications: Secondary | ICD-10-CM | POA: Diagnosis present

## 2023-10-24 DIAGNOSIS — M4854XA Collapsed vertebra, not elsewhere classified, thoracic region, initial encounter for fracture: Secondary | ICD-10-CM | POA: Diagnosis present

## 2023-10-24 DIAGNOSIS — A419 Sepsis, unspecified organism: Secondary | ICD-10-CM

## 2023-10-24 DIAGNOSIS — F1910 Other psychoactive substance abuse, uncomplicated: Secondary | ICD-10-CM

## 2023-10-24 DIAGNOSIS — Z01818 Encounter for other preprocedural examination: Secondary | ICD-10-CM

## 2023-10-24 HISTORY — DX: Hypothyroidism, unspecified: E03.9

## 2023-10-24 HISTORY — DX: Gastro-esophageal reflux disease without esophagitis: K21.9

## 2023-10-24 LAB — CBC WITH DIFFERENTIAL/PLATELET
Abs Immature Granulocytes: 0.11 10*3/uL — ABNORMAL HIGH (ref 0.00–0.07)
Basophils Absolute: 0.1 10*3/uL (ref 0.0–0.1)
Basophils Relative: 0 %
Eosinophils Absolute: 0 10*3/uL (ref 0.0–0.5)
Eosinophils Relative: 0 %
HCT: 42.3 % (ref 36.0–46.0)
Hemoglobin: 13.2 g/dL (ref 12.0–15.0)
Immature Granulocytes: 1 %
Lymphocytes Relative: 9 %
Lymphs Abs: 1.6 10*3/uL (ref 0.7–4.0)
MCH: 27 pg (ref 26.0–34.0)
MCHC: 31.2 g/dL (ref 30.0–36.0)
MCV: 86.7 fL (ref 80.0–100.0)
Monocytes Absolute: 1.7 10*3/uL — ABNORMAL HIGH (ref 0.1–1.0)
Monocytes Relative: 9 %
Neutro Abs: 14.9 10*3/uL — ABNORMAL HIGH (ref 1.7–7.7)
Neutrophils Relative %: 81 %
Platelets: 338 10*3/uL (ref 150–400)
RBC: 4.88 MIL/uL (ref 3.87–5.11)
RDW: 17.9 % — ABNORMAL HIGH (ref 11.5–15.5)
WBC: 18.4 10*3/uL — ABNORMAL HIGH (ref 4.0–10.5)
nRBC: 0 % (ref 0.0–0.2)

## 2023-10-24 LAB — BASIC METABOLIC PANEL WITH GFR
Anion gap: 15 (ref 5–15)
BUN: 7 mg/dL (ref 6–20)
CO2: 23 mmol/L (ref 22–32)
Calcium: 9.1 mg/dL (ref 8.9–10.3)
Chloride: 104 mmol/L (ref 98–111)
Creatinine, Ser: 1.38 mg/dL — ABNORMAL HIGH (ref 0.44–1.00)
GFR, Estimated: 46 mL/min — ABNORMAL LOW (ref >60)
Glucose, Bld: 150 mg/dL — ABNORMAL HIGH (ref 70–99)
Potassium: 3 mmol/L — ABNORMAL LOW (ref 3.5–5.1)
Sodium: 142 mmol/L (ref 135–145)

## 2023-10-24 LAB — ETHANOL: Alcohol, Ethyl (B): <15 mg/dL (ref <15)

## 2023-10-24 LAB — HEPATIC FUNCTION PANEL
ALT: 13 U/L (ref 0–44)
AST: 24 U/L (ref 15–41)
Albumin: 3.6 g/dL (ref 3.5–5.0)
Alkaline Phosphatase: 114 U/L (ref 38–126)
Bilirubin, Direct: 0.2 mg/dL (ref 0.0–0.2)
Indirect Bilirubin: 0.6 mg/dL (ref 0.3–0.9)
Total Bilirubin: 0.8 mg/dL (ref 0.0–1.2)
Total Protein: 8.6 g/dL — ABNORMAL HIGH (ref 6.5–8.1)

## 2023-10-24 LAB — URINALYSIS, ROUTINE W REFLEX MICROSCOPIC
Glucose, UA: NEGATIVE mg/dL
Hgb urine dipstick: NEGATIVE
Ketones, ur: 5 mg/dL — AB
Leukocytes,Ua: NEGATIVE
Nitrite: NEGATIVE
Protein, ur: 100 mg/dL — AB
Specific Gravity, Urine: 1.027 (ref 1.005–1.030)
pH: 5 (ref 5.0–8.0)

## 2023-10-24 LAB — I-STAT CHEM 8, ED
BUN: 7 mg/dL (ref 6–20)
Calcium, Ion: 1.14 mmol/L — ABNORMAL LOW (ref 1.15–1.40)
Chloride: 103 mmol/L (ref 98–111)
Creatinine, Ser: 1.4 mg/dL — ABNORMAL HIGH (ref 0.44–1.00)
Glucose, Bld: 169 mg/dL — ABNORMAL HIGH (ref 70–99)
HCT: 50 % — ABNORMAL HIGH (ref 36.0–46.0)
Hemoglobin: 17 g/dL — ABNORMAL HIGH (ref 12.0–15.0)
Potassium: 3.6 mmol/L (ref 3.5–5.1)
Sodium: 144 mmol/L (ref 135–145)
TCO2: 27 mmol/L (ref 22–32)

## 2023-10-24 LAB — RAPID URINE DRUG SCREEN, HOSP PERFORMED
Amphetamines: POSITIVE — AB
Barbiturates: NOT DETECTED
Benzodiazepines: NOT DETECTED
Cocaine: NOT DETECTED
Opiates: POSITIVE — AB
Tetrahydrocannabinol: NOT DETECTED

## 2023-10-24 LAB — LIPASE, BLOOD: Lipase: 19 U/L (ref 11–51)

## 2023-10-24 LAB — I-STAT CG4 LACTIC ACID, ED
Lactic Acid, Venous: 2.9 mmol/L (ref 0.5–1.9)
Lactic Acid, Venous: 1.8 mmol/L (ref 0.5–1.9)

## 2023-10-24 LAB — TROPONIN I (HIGH SENSITIVITY)
Troponin I (High Sensitivity): 5 ng/L (ref <18)
Troponin I (High Sensitivity): 8 ng/L (ref <18)

## 2023-10-24 LAB — SURGICAL PCR SCREEN
MRSA, PCR: NEGATIVE
Staphylococcus aureus: NEGATIVE

## 2023-10-24 LAB — HEMOGLOBIN A1C
Hgb A1c MFr Bld: 5.8 % — ABNORMAL HIGH (ref 4.8–5.6)
Mean Plasma Glucose: 119.76 mg/dL

## 2023-10-24 LAB — AMMONIA: Ammonia: 24 umol/L (ref 9–35)

## 2023-10-24 LAB — GLUCOSE, CAPILLARY: Glucose-Capillary: 188 mg/dL — ABNORMAL HIGH (ref 70–99)

## 2023-10-24 MED ORDER — RENA-VITE PO TABS
1.0000 | ORAL_TABLET | Freq: Every day | ORAL | Status: DC
  Administered 2023-10-25: 1 via ORAL
  Filled 2023-10-24: qty 1

## 2023-10-24 MED ORDER — LACTATED RINGERS IV BOLUS (SEPSIS)
1140.0000 mL | Freq: Once | INTRAVENOUS | Status: AC
Start: 2023-10-24 — End: 2023-10-24
  Administered 2023-10-24: 1140 mL via INTRAVENOUS

## 2023-10-24 MED ORDER — POTASSIUM CHLORIDE 10 MEQ/100ML IV SOLN
10.0000 meq | INTRAVENOUS | Status: AC
  Administered 2023-10-24 (×4): 10 meq via INTRAVENOUS
  Filled 2023-10-24 (×4): qty 100

## 2023-10-24 MED ORDER — LACTATED RINGERS IV BOLUS
1000.0000 mL | Freq: Once | INTRAVENOUS | Status: AC
  Administered 2023-10-24: 1000 mL via INTRAVENOUS

## 2023-10-24 MED ORDER — LACTATED RINGERS IV SOLN: INTRAVENOUS | Status: AC

## 2023-10-24 MED ORDER — TIZANIDINE HCL 2 MG PO TABS
4.0000 mg | ORAL_TABLET | Freq: Three times a day (TID) | ORAL | Status: DC | PRN
  Administered 2023-10-25: 4 mg via ORAL
  Filled 2023-10-24: qty 2

## 2023-10-24 MED ORDER — TRAZODONE HCL 100 MG PO TABS: 300.0000 mg | ORAL_TABLET | Freq: Every day | ORAL | Status: DC

## 2023-10-24 MED ORDER — SODIUM CHLORIDE 0.9% FLUSH
3.0000 mL | Freq: Two times a day (BID) | INTRAVENOUS | Status: DC
  Administered 2023-10-24 – 2023-10-26 (×4): 3 mL via INTRAVENOUS

## 2023-10-24 MED ORDER — VANCOMYCIN HCL IN DEXTROSE 1-5 GM/200ML-% IV SOLN: 1000.0000 mg | Freq: Once | INTRAVENOUS | Status: DC

## 2023-10-24 MED ORDER — OXYCODONE HCL 5 MG PO TABS
10.0000 mg | ORAL_TABLET | Freq: Four times a day (QID) | ORAL | Status: DC | PRN
  Administered 2023-10-25 – 2023-10-26 (×4): 10 mg via ORAL
  Filled 2023-10-24 (×5): qty 2

## 2023-10-24 MED ORDER — OXYCODONE HCL 5 MG PO TABS: 7.5000 mg | ORAL_TABLET | Freq: Four times a day (QID) | ORAL | Status: DC | PRN

## 2023-10-24 MED ORDER — SODIUM CHLORIDE 0.9 % IV BOLUS
500.0000 mL | Freq: Once | INTRAVENOUS | Status: AC
Start: 2023-10-24 — End: 2023-10-24
  Administered 2023-10-24: 500 mL via INTRAVENOUS

## 2023-10-24 MED ORDER — VANCOMYCIN VARIABLE DOSE PER UNSTABLE RENAL FUNCTION (PHARMACIST DOSING): Status: DC

## 2023-10-24 MED ORDER — PANTOPRAZOLE SODIUM 40 MG PO TBEC
40.0000 mg | DELAYED_RELEASE_TABLET | Freq: Two times a day (BID) | ORAL | Status: DC
  Administered 2023-10-25 – 2023-10-26 (×3): 40 mg via ORAL
  Filled 2023-10-24 (×3): qty 1

## 2023-10-24 MED ORDER — VANCOMYCIN HCL 2000 MG/400ML IV SOLN
2000.0000 mg | Freq: Once | INTRAVENOUS | Status: AC
  Administered 2023-10-24: 2000 mg via INTRAVENOUS
  Filled 2023-10-24: qty 400

## 2023-10-24 MED ORDER — PRAZOSIN HCL 2 MG PO CAPS
5.0000 mg | ORAL_CAPSULE | Freq: Every day | ORAL | Status: DC
  Filled 2023-10-24 (×3): qty 1

## 2023-10-24 MED ORDER — ACETAMINOPHEN 500 MG PO TABS
1000.0000 mg | ORAL_TABLET | Freq: Four times a day (QID) | ORAL | Status: DC | PRN
  Administered 2023-10-26: 1000 mg via ORAL
  Filled 2023-10-24: qty 2

## 2023-10-24 MED ORDER — DULOXETINE HCL 60 MG PO CPEP
60.0000 mg | ORAL_CAPSULE | Freq: Two times a day (BID) | ORAL | Status: DC
  Administered 2023-10-25 – 2023-10-26 (×3): 60 mg via ORAL
  Filled 2023-10-24 (×3): qty 1

## 2023-10-24 MED ORDER — CEFEPIME HCL 2 G IV SOLR
2.0000 g | Freq: Once | INTRAVENOUS | Status: AC
  Administered 2023-10-24: 2 g via INTRAVENOUS
  Filled 2023-10-24: qty 12.5

## 2023-10-24 MED ORDER — ARIPIPRAZOLE 10 MG PO TABS
10.0000 mg | ORAL_TABLET | Freq: Every day | ORAL | Status: DC
  Administered 2023-10-25 – 2023-10-26 (×2): 10 mg via ORAL
  Filled 2023-10-24 (×2): qty 1

## 2023-10-24 MED ORDER — SODIUM CHLORIDE 0.9 % IV SOLN
2.0000 g | INTRAVENOUS | Status: DC
  Administered 2023-10-25: 2 g via INTRAVENOUS
  Filled 2023-10-24: qty 20

## 2023-10-24 MED ORDER — PREGABALIN 25 MG PO CAPS
50.0000 mg | ORAL_CAPSULE | Freq: Two times a day (BID) | ORAL | Status: DC
  Administered 2023-10-25 – 2023-10-26 (×2): 50 mg via ORAL
  Filled 2023-10-24 (×2): qty 2

## 2023-10-24 MED ORDER — CARVEDILOL 12.5 MG PO TABS
12.5000 mg | ORAL_TABLET | Freq: Two times a day (BID) | ORAL | Status: DC
  Administered 2023-10-25 (×2): 12.5 mg via ORAL
  Filled 2023-10-24 (×2): qty 1

## 2023-10-24 MED ORDER — CLONAZEPAM 0.5 MG PO TABS
0.5000 mg | ORAL_TABLET | Freq: Every day | ORAL | Status: DC | PRN
  Administered 2023-10-25: 0.5 mg via ORAL
  Filled 2023-10-24 (×2): qty 1

## 2023-10-24 MED ORDER — METRONIDAZOLE 500 MG/100ML IV SOLN
500.0000 mg | Freq: Once | INTRAVENOUS | Status: AC
  Administered 2023-10-24: 500 mg via INTRAVENOUS
  Filled 2023-10-24: qty 100

## 2023-10-24 MED ORDER — LEVOTHYROXINE SODIUM 50 MCG PO TABS
50.0000 ug | ORAL_TABLET | Freq: Every day | ORAL | Status: DC
  Administered 2023-10-25 – 2023-10-26 (×2): 50 ug via ORAL
  Filled 2023-10-24 (×2): qty 1

## 2023-10-24 MED ORDER — BACLOFEN 5 MG HALF TABLET
5.0000 mg | ORAL_TABLET | Freq: Every day | ORAL | Status: DC
  Filled 2023-10-24: qty 1

## 2023-10-24 MED ORDER — LABETALOL HCL 5 MG/ML IV SOLN
10.0000 mg | Freq: Once | INTRAVENOUS | Status: AC
  Administered 2023-10-24: 10 mg via INTRAVENOUS
  Filled 2023-10-24: qty 4

## 2023-10-24 MED ORDER — ATORVASTATIN CALCIUM 10 MG PO TABS
20.0000 mg | ORAL_TABLET | Freq: Every day | ORAL | Status: DC
  Administered 2023-10-25 – 2023-10-26 (×2): 20 mg via ORAL
  Filled 2023-10-24 (×2): qty 2

## 2023-10-24 NOTE — H&P (Signed)
 History and Physical    [name] FMW:[id number] DOB: [birth year] DOA: 10/24/2023  PCP: [name], PA-C   Patient coming from: Home   Chief Complaint:  Chief Complaint  Patient presents with   Back Pain   Anxiety    HPI: Limited due to AMS  [name] is a 53 y.o. female with hx of cardiac arrest '22 with anoxic brain injury, pulmonary hypertension, diastolic dysfunction, aortic regurgitation, hypertension, hyperlipidemia, hypothyroidism, barrets esophagus, chronic compression fracture L4, T12,  chronic pain, mood d/o, who was brought in from Pain medicine clinic due to altered mental status. Per anesthesia note,  She was very restless during blood draw attempts, and kept moving her arms, repositioning, and facial wincing/grimacing. She also has small veins. After another unsuccessful lab draw attempt I continued to talk with her and her husband, but felt she was tiring out and dozing off, although still easily arousable to voice . Husband requested transport to ED for further evaluation. Discussed with husband over the phone, seemed at her normal before this clinic appointment. However did have nausea and vomiting. Husband not sure if she had maybe taken too many of her home medications.   On interview with the patient she is unable to provide history. She awakens and has perseverative speech, what happened to me.    Review of Systems:  ROS limited due to AMS  No Known Allergies  Prior to Admission medications   Medication Sig Start Date End Date Taking? Authorizing Provider  acetaminophen  (TYLENOL ) 650 MG CR tablet Take 1,300 mg by mouth every 8 (eight) hours as needed for pain.   Yes [provider]  ARIPiprazole  (ABILIFY ) 10 MG tablet Take 1 tablet (10 mg total) by mouth daily. 10/16/23  Yes   atorvastatin  (LIPITOR) 20 MG tablet Take 1 tablet (20 mg total) by mouth daily. 09/28/23  Yes   Baclofen  5 MG TABS Take 1 tablet by mouth daily.   Yes [provider]  carvedilol  (COREG ) 12.5 MG tablet Take 12.5 mg by mouth 2 (two) times daily with a meal.   Yes [provider]  clonazePAM  (KLONOPIN ) 0.5 MG tablet Take 1 tablet (0.5 mg total) by mouth daily as needed for severe breakthrough panic/anxiety. 10/16/23  Yes   DULoxetine  (CYMBALTA ) 60 MG capsule Take 1 capsule (60 mg total) by mouth 2 (two) times daily. 10/16/23  Yes   ibuprofen  (ADVIL ) 200 MG tablet Take 400 mg by mouth daily as needed for moderate pain (pain score 4-6) or mild pain (pain score 1-3).   Yes [provider]  levothyroxine  (SYNTHROID ) 50 MCG tablet Take 50 mcg by mouth daily before breakfast.   Yes [provider]  lidocaine  (LIDODERM ) 5 % Place 1 patch onto the skin daily. Remove & Discard patch within 12 hours or as directed by MD Patient taking differently: Place 1 patch onto the skin daily as needed (for pain). 10/11/23  Yes Rai, Ripudeep K, MD  metoCLOPramide  (REGLAN ) 5 MG tablet Take 1 tablet (5 mg total) by mouth every 8 (eight) hours as needed for nausea or vomiting. 10/11/23  Yes Rai, Ripudeep K, MD  multivitamin (RENA-VIT) TABS tablet Take 1 tablet by mouth at bedtime. 08/01/20  Yes Tobie Burgess Op[place], MD  ondansetron  (ZOFRAN -ODT) 8 MG disintegrating tablet Take 1 tablet (8 mg total) by mouth daily as needed for nausea or vomiting. 10/11/23  Yes Rai, Ripudeep K, MD  oxyCODONE  (ROXICODONE ) 15 MG immediate release tablet Take 1 tablet (15  mg total) by mouth 4 (four) times daily as needed. Patient taking differently: Take 15 mg by mouth in the morning, at noon, in the evening, and at bedtime. 10/04/23  Yes   pantoprazole  (PROTONIX ) 40 MG tablet Take 1 tablet by mouth twice daily 09/21/22  Yes Kerman Vina HERO, NP  prazosin  (MINIPRESS ) 5 MG capsule Take 1 capsule (5 mg total) by mouth at bedtime. 10/16/23  Yes   pregabalin  (LYRICA ) 50 MG capsule Take 50 mg by mouth 2 (two) times daily. 07/10/23  Yes [provider]  PROLIA 60 MG/ML SOSY  injection Inject 60 mg into the skin every 6 (six) months. 09/30/23  Yes [provider]  promethazine  (PHENERGAN ) 25 MG tablet Take 1 tablet (25 mg total) by mouth every 6 (six) hours as needed for nausea or vomiting. 10/11/23  Yes Rai, Ripudeep K, MD  tirzepatide  (MOUNJARO ) 7.5 MG/0.5ML Pen Inject 7.5 mg into the skin once a week. 10/22/23  Yes   tiZANidine  (ZANAFLEX ) 4 MG tablet Take 1 tablet (4 mg total) by mouth every 6 (six) hours as needed. Patient taking differently: Take 4 mg by mouth 3 (three) times daily. 10/22/23  Yes   trazodone  (DESYREL ) 300 MG tablet Take 2 tablets (600 mg total) by mouth at bedtime. 10/16/23  Yes   clonazePAM  (KLONOPIN ) 0.5 MG tablet Take 1 tablet (0.5 mg total) by mouth daily as needed for severe breakthrough panic or anxiety Patient not taking: Reported on 10/23/2023 09/04/23       Past Medical History:  Diagnosis Date   Anxiety    Arthritis    joints ache all over (10/15/2014)   Barrett's esophagus    Bulging lumbar disc    Cardiac arrest (HCC)    Chronic lower back pain    Coronary artery disease    DDD (degenerative disc disease), cervical    Depression    DM (diabetes mellitus) (HCC)    Drug-seeking behavior    GERD (gastroesophageal reflux disease)    Headache    weekly (10/15/2014)   Hyperlipemia    Hypertension    Hypothyroidism    PTSD (post-traumatic stress disorder)    Skin cancer    had them cut off my arms; don't know what kind    Past Surgical History:  Procedure Laterality Date   ABLATION ON ENDOMETRIOSIS  2008   BIOPSY  12/27/2018   Procedure: BIOPSY;  Surgeon: Eda Iha, MD;  Location: [facility];  Service: Gastroenterology;;   BIOPSY  10/05/2021   Procedure: BIOPSY;  Surgeon: Avram Lupita BRAVO, MD;  Location: [facility] ENDOSCOPY;  Service: Gastroenterology;;   ESOPHAGOGASTRODUODENOSCOPY (EGD) WITH PROPOFOL  N/A 12/27/2018   Procedure: ESOPHAGOGASTRODUODENOSCOPY (EGD) WITH PROPOFOL ;  Surgeon: Eda Iha, MD;  Location:  [facility];  Service: Gastroenterology;  Laterality: N/A;   ESOPHAGOGASTRODUODENOSCOPY (EGD) WITH PROPOFOL  N/A 10/05/2021   Procedure: ESOPHAGOGASTRODUODENOSCOPY (EGD) WITH PROPOFOL ;  Surgeon: Avram Lupita BRAVO, MD;  Location: [facility] ENDOSCOPY;  Service: Gastroenterology;  Laterality: N/A;   HEMORRHOID SURGERY  ~ 2002   IR FLUORO GUIDE CV LINE RIGHT  06/14/2020   IR REMOVAL TUN CV CATH W/O FL  06/23/2020   IR US  GUIDE VASC ACCESS RIGHT  06/14/2020   ORIF ANKLE FRACTURE Right 03/28/2020   Procedure: OPEN REDUCTION INTERNAL FIXATION (ORIF) RIGHT BIMALLEOLAR ANKLE FRACTURE;  Surgeon: Josefina Chew, MD;  Location: [facility];  Service: Orthopedics;  Laterality: Right;     reports that she has never smoked. She has never used smokeless tobacco. She reports that  she does not currently use alcohol . She reports that she does not use drugs.  Family History  Problem Relation Age of Onset   Breast cancer Mother    Diabetes Mother    Breast cancer Maternal Grandmother    Breast cancer Paternal Grandmother    Colon polyps Paternal Grandmother    Colon cancer Neg Hx    Esophageal cancer Neg Hx    Rectal cancer Neg Hx    Stomach cancer Neg Hx      Physical Exam: Vitals:   10/24/23 1815 10/24/23 1845 10/24/23 1915 10/24/23 2010  BP: (!) 133/108 (!) 150/100 125/88   Pulse: (!) 114 (!) 114 (!) 111   Resp: 19 20 (!) 22   Temp:    97.6 F (36.4 C)  TempSrc:    Axillary  SpO2: 93% 100% 100%     Gen: Somnolent, awakens to moderate nonpainful stimuli.  Initially falling asleep quickly but later able to maintain alertness. acutely and chronically ill-appearing HEENT: Dry oral mucosa CV: Regular, normal S1, S2, no murmurs  Resp: Normal WOB, CTAB  Abd: Flat, normoactive, nontender MSK: TLSO in place. LE Symmetric, no edema  Skin: No rashes or lesions to exposed skin  Neuro: Somnolent, awakens to moderate nonpainful stimuli.  Initially falling asleep quickly but later able to maintain  alertness.  Oriented to self, hospital (not to [place]), not to time or situation.  CN exam limited.  PERRL, no facial droop, tongue midline.  Motor is antigravity in all extremities, localizes to pain.  Normal tone. Choreiform movements  Psych: encephalopathic.    Data review:   Labs reviewed, notable for:   Lactate 2.9 -> 1.8 K3 Creatinine 1.38 (baseline 0.5) Ammonia within normal limit High-sensitivity troponin negative WBC 18 UA rare bacteria, no pyuria, no leukocyte or nitrate.  Micro:  Results for orders placed or performed during the hospital encounter of 10/24/23  Surgical pcr screen     Status: None   Collection Time: 10/24/23 11:10 AM   Specimen: Nasal Mucosa; Nasal Swab  Result Value Ref Range Status   MRSA, PCR NEGATIVE NEGATIVE Final   Staphylococcus aureus NEGATIVE NEGATIVE Final    Comment: (NOTE) The Xpert SA Assay (FDA approved for NASAL specimens in patients 48 years of age and older), is one component of a comprehensive surveillance program. It is not intended to diagnose infection nor to guide or monitor treatment. Performed at [facility], [address], [place], [place] [address]     Imaging reviewed:  CT Head Wo Contrast Result Date: 10/24/2023 CLINICAL DATA:  Altered mental status. EXAM: CT HEAD WITHOUT CONTRAST TECHNIQUE: Contiguous axial images were obtained from the base of the skull through the vertex without intravenous contrast. RADIATION DOSE REDUCTION: This exam was performed according to the departmental dose-optimization program which includes automated exposure control, adjustment of the mA and/or kV according to patient size and/or use of iterative reconstruction technique. COMPARISON:  CT head 06/25/2022. FINDINGS: Brain: No acute intracranial hemorrhage. No CT evidence of acute infarct. No edema, mass effect, or midline shift. The basilar cisterns are patent. There is mild asymmetric prominence of the left sylvian fissure suggestive  of mild frontotemporal volume loss similar to prior. Ventricles: The ventricles are normal. Vascular: No hyperdense vessel or unexpected calcification. Skull: No acute or aggressive finding. Orbits: Orbits are symmetric. Sinuses: The visualized paranasal sinuses are clear. Other: Mastoid air cells are clear. IMPRESSION: No CT evidence of acute intracranial abnormality. Electronically Signed   By:  Donnice Mania M.D.   On: 10/24/2023 16:47   DG Lumbar Spine Complete Result Date: 10/24/2023 CLINICAL DATA:  back pain EXAM: LUMBAR SPINE - COMPLETE 4+ VIEW COMPARISON:  Sep 11, 2023, July 27, 2023 FINDINGS: Diffuse osteopenia. Five non rib-bearing lumbar type vertebral bodies. Straightening of the normal lumbar lordosis without spondylolisthesis. Similar appearance of the burst fracture involving T12. Unchanged chronic compression fracture of L4 with superior endplate concavity along L3. Subtle endplate concavity along the superior endplate of L5 with sclerosis. Moderate intervertebral disc height loss at L5-S1. no pars interarticularis defects. The soft tissues are otherwise unremarkable. IMPRESSION: 1. Diffuse osteopenia. Subtle endplate concavity along the superior endplate of L5 with underlying sclerosis. This could reflect imaging artifact versus a subtle acute compression fracture. Correlation with point tenderness recommended. 2. Similar appearance of the burst fracture of T12. Electronically Signed   By: Rogelia Myers M.D.   On: 10/24/2023 16:26   DG Chest 1 View Result Date: 10/24/2023 CLINICAL DATA:  back pain EXAM: CHEST  1 VIEW COMPARISON:  October 08, 2023 FINDINGS: Overlying metallic structure, possibly a external brace. Elevation of the right hemidiaphragm. Streaky atelectasis again noted in the right lung base. No new airspace consolidation, pleural effusion, or pneumothorax. No cardiomegaly.Partially visualized compression fracture in the lower thoracic spine. IMPRESSION: No acute cardiopulmonary  abnormality. Electronically Signed   By: Rogelia Myers M.D.   On: 10/24/2023 16:22    EKG:  Personally reviewed: Sinus tach, borderline LVH, RSR prime in V1 V2, no acute ischemic change.  ED Course:  Treated with approximately 2.6 L IV fluid, vancomycin , cefepime , Flagyl . Labetalol  10 mg IV x 1.   Assessment/Plan:  53 y.o. female with hx cardiac arrest '22 with anoxic brain injury, pulmonary hypertension, diastolic dysfunction, aortic regurgitation, hypertension, hyperlipidemia, hypothyroidism, barrets esophagus, chronic compression fracture L4, T12,  chronic pain, mood d/o, who was brought in from Pain medicine clinic due to altered mental status.   Encephalopathy, acute  Acute onset of disorientation, somnolence. No focal deficits, + perseverative speech and choreiform movements on exam. Lab with findings of dehydration, lactic acidosis, AKI, leukocytosis. CT Head negative for acute findings. suspect toxic-metabolic in setting of dehydration, AKI, sedating meds. Possible component of underlying infection although no clear source.  -Management of volume depletion, AKI per below -Reduce dose of baclofen  5-> 2.5 mg daily starting tomorrow morning -Reduce oxycodone  from 15-> 7.5/10 mg every 6 hours as needed for moderate/severe pain -Reduce trazodone  600 mg 300 mg nightly -Evaluation for underlying infection per below -Check VBG, B12, TSH  Question of sepsis, source unclear  Dehydration  Initial presentation tachycardic to 110s, other vitals normal. Afebrile.  Lactate 2.9 -> 1.8 with IV fluids.  WBC 18.  Underlying source unclear, no localizing symptoms other than chronic back pain. LFT wnl. UA with bacteria, no leukocytes or nitrates or pyuria not consistent with infection. Flu / COVID / RSV pending. Chest x-ray without infiltrates. - Continue vancomycin  pharmacy to dose, ceftriaxone  2 g IV every 24 hours for now. - Follow-up blood culture, 1 set obtained in the ED, follow-up  flu/COVID/RSV - Consider MRI of T/L spine to rule out osteomyelitis/discitis if no revealing findings elsewhere - S/p ~ 2.6 L IVF, continue LR at 100 cc an hour.  Lactate is normalized  AKI stage II Baseline creatinine 0.5, elevated 1.38 on admission.  Suspect prerenal in setting of dehydration, possible infection. - IV fluids per above - Check PVR  Choreiform movement ? Chronicity  -Would discontinue Reglan   ?  Of L5 superior endplate fracture v artifact.  -Noted on x-ray   Preop eval for laminectomy  -Would defer echo /cardiology input to outpatient setting   Chronic medical problems: cardiac arrest '22 with anoxic brain injury: Noted Pulmonary hypertension, diastolic dysfunction, aortic regurgitation: To have repeat echo outpatient for preop testing for laminectomy. Hypertension: Continue home carvedilol  12.5 mg twice daily Hyperlipidemia: Not on cholesterol-lowering agents Hypothyroidism: Continue levothyroxine .  Check TSH Barrets esophagus: Continue pantoprazole  twice daily chronic compression fracture L4, T12,  chronic pain: See changes in home pain medications above (baclofen , oxycodone ), otherwise continue home Lyrica , tizanidine  3 times daily as needed.  Mood d/o: Continue home aripiprazole , duloxetine , prazosin .  Reducing trazodone  per above  There is no height or weight on file to calculate BMI.  On Mounjaro  patient for obesity   DVT prophylaxis:  SCDs Code Status:  Full Code Diet:  Diet Orders (From admission, onward)     Start     Ordered   10/24/23 2130  Diet regular Room service appropriate? Yes; Fluid consistency: Thin  Diet effective now       Comments: Ok for diet if passes swallow  Question Answer Comment  Room service appropriate? Yes   Fluid consistency: Thin      10/24/23 2131           Family Communication:  Yes discussed with husband   Consults:  None   Admission status:   Inpatient, Telemetry bed  Severity of Illness: The appropriate  patient status for this patient is INPATIENT. Inpatient status is judged to be reasonable and necessary in order to provide the required intensity of service to ensure the patient's safety. The patient's presenting symptoms, physical exam findings, and initial radiographic and laboratory data in the context of their chronic comorbidities is felt to place them at high risk for further clinical deterioration. Furthermore, it is not anticipated that the patient will be medically stable for discharge from the hospital within 2 midnights of admission.   * I certify that at the point of admission it is my clinical judgment that the patient will require inpatient hospital care spanning beyond 2 midnights from the point of admission due to high intensity of service, high risk for further deterioration and high frequency of surveillance required.*   Dorn Dawson, MD [facility]  How to contact the [facility] Attending or Consulting provider 7A - 7P or covering provider during after hours 7P -7A, for this patient.  Check the care team in [facility] and look for a) attending/consulting [facility] provider listed and b) the [facility] team listed Log into [email or web address] and use [facility]'s universal password to access. If you do not have the password, please contact the hospital operator. Locate the [facility] provider you are looking for under [facility] and page to a number that you can be directly reached. If you still have difficulty reaching the provider, please page the [facility] (Director on Call) for the Hospitalists listed on amion for assistance.  10/24/2023, 9:48 PM

## 2023-10-24 NOTE — Progress Notes (Addendum)
 Elink monitoring for the code sepsis protocol.  2200 Notified bedside nurse of need to draw blood cultures. Patient difficult stick so Abx given before.

## 2023-10-24 NOTE — ED Notes (Signed)
 Pt a very difficult stick & this RN asked mini lab to assist getting the remaining labs that would not pull from PIV started.

## 2023-10-24 NOTE — ED Notes (Signed)
 Patient transported to X-ray

## 2023-10-24 NOTE — Progress Notes (Signed)
 Pharmacy Antibiotic Note  [name] is a 53 y.o. female admitted on 10/24/2023 with AMS and concerns for sepsis of unknown source.  Pharmacy has been consulted for vancomycin  dosing.  -WBC 18, sCr 1.38 (bl~0.5-0.6), afebrile -Single blood culture ordered -UA: Leukocyte/nitrite neg, WBC 0-5  Plan: -Ceftriaxone  2g IV every 24 hours -Vancomycin  2g IV x1 -Vancomycin  variable until AKI resolves  -Monitor renal function -Follow up signs of clinical improvement, LOT, de-escalation of antibiotics      Temp (24hrs), Avg:98.3 F (36.8 C), Min:97.6 F (36.4 C), Max:98.7 F (37.1 C)  Recent Labs  Lab 10/24/23 1644 10/24/23 1702 10/24/23 1925 10/24/23 1943  WBC  --   --  18.4*  --   CREATININE 1.40* 1.38*  --   --   LATICACIDVEN 2.9*  --   --  1.8    Estimated Creatinine Clearance: 51.3 mL/min (A) (by C-G formula based on SCr of 1.38 mg/dL (H)).    No Known Allergies  Antimicrobials this admission: Ceftriaxone  7/4 >>  Vancomycin  7/3 >>   Microbiology results: 7/3 BCx: ordered  Thank you for allowing pharmacy to be a part of this patient's care.  Lynwood Poplar, PharmD, BCPS Clinical Pharmacist 10/24/2023 10:07 PM

## 2023-10-24 NOTE — ED Triage Notes (Signed)
 Pt was sent here due to anxiety attack while getting blood work.  Pt has chronic lower back pain and will have lumbar fusion on 7/17

## 2023-10-24 NOTE — Progress Notes (Addendum)
 PCP - [facility] Cardiologist - [address] [facility]E LOV 08-09-21  PPM/ICD - denies Device Orders -  Rep Notified -   Chest x-ray -10/08/23 EKG - 10/08/23 Stress Test - none ECHO - 06/30/22 Cardiac Cath - denies  Sleep Study - denies CPAP -   Fasting Blood Sugar - 180's Checks Blood Sugar 2-3 times a week  Last dose of GLP1 agonist-  10/20/23 GLP1 instructions:   Blood Thinner Instructions:na Aspirin Instructions:na  ERAS Protcol -no PRE-SURGERY Ensure or G2-   COVID TEST- na   Anesthesia review: yes- recent hospital admission 6/17-6/20/25 for AKI  Patient denies shortness of breath, fever, cough and chest pain at PAT appointment   All instructions explained to the patient, with a verbal understanding of the material. Patient agrees to go over the instructions while at home for a better understanding.  The opportunity to ask questions was provided.

## 2023-10-24 NOTE — ED Provider Notes (Signed)
 [place] EMERGENCY DEPARTMENT AT [facility] Provider Note   CSN: [id number] Arrival date & time: 10/24/23  1133     Patient presents with: Back Pain and Anxiety   [name] is a 52 y.o. female.   53 year old female presents for reported back pain and anxiety.  Patient is refusing to speak to me.  History obtained from triage note.  Per EMS patient was getting preop labs for a laminectomy later this month and she got very anxious and nervous and had it severe back pain.   Back Pain Anxiety       Prior to Admission medications   Medication Sig Start Date End Date Taking? Authorizing Provider  acetaminophen  (TYLENOL ) 650 MG CR tablet Take 1,300 mg by mouth every 8 (eight) hours as needed for pain.   Yes [provider]  ARIPiprazole  (ABILIFY ) 10 MG tablet Take 1 tablet (10 mg total) by mouth daily. 10/16/23  Yes   oxyCODONE  (ROXICODONE ) 15 MG immediate release tablet Take 1 tablet (15 mg total) by mouth 4 (four) times daily as needed. Patient taking differently: Take 15 mg by mouth in the morning, at noon, in the evening, and at bedtime. 10/04/23  Yes   atorvastatin  (LIPITOR) 10 MG tablet Take 10 mg by mouth daily.    [provider]  atorvastatin  (LIPITOR) 20 MG tablet Take 1 tablet (20 mg total) by mouth daily. Patient not taking: Reported on 10/23/2023 09/28/23     baclofen (LIORESAL) 10 MG tablet Take 10 mg by mouth 2 (two) times daily.    [provider]  carvedilol  (COREG ) 12.5 MG tablet Take 12.5 mg by mouth 2 (two) times daily with a meal.    [provider]  clonazePAM  (KLONOPIN ) 0.5 MG tablet Take 1 tablet (0.5 mg total) by mouth daily as needed for severe breakthrough panic or anxiety Patient not taking: Reported on 10/23/2023 09/04/23     clonazePAM  (KLONOPIN ) 0.5 MG tablet Take 1 tablet (0.5 mg total) by mouth daily as needed for severe breakthrough panic/anxiety. 10/16/23     DULoxetine  (CYMBALTA ) 60 MG capsule Take 1  capsule (60 mg total) by mouth 2 (two) times daily. 10/16/23     ibuprofen  (ADVIL ) 200 MG tablet Take 400 mg by mouth daily as needed for moderate pain (pain score 4-6) or mild pain (pain score 1-3).    [provider]  levothyroxine  (SYNTHROID ) 50 MCG tablet Take 50 mcg by mouth daily before breakfast.    [provider]  lidocaine  (LIDODERM ) 5 % Place 1 patch onto the skin daily. Remove & Discard patch within 12 hours or as directed by MD 10/11/23   Rai, Nydia POUR, MD  metoCLOPramide  (REGLAN ) 5 MG tablet Take 1 tablet (5 mg total) by mouth every 8 (eight) hours as needed for nausea or vomiting. 10/11/23   Rai, Nydia POUR, MD  multivitamin (RENA-VIT) TABS tablet Take 1 tablet by mouth at bedtime. 08/01/20   Tobie Burgess [place], MD  ondansetron  (ZOFRAN -ODT) 8 MG disintegrating tablet Take 1 tablet (8 mg total) by mouth daily as needed for nausea or vomiting. 10/11/23   Rai, Nydia POUR, MD  pantoprazole  (PROTONIX ) 40 MG tablet Take 1 tablet by mouth twice daily 09/21/22   Kerman Vina HERO, NP  prazosin  (MINIPRESS ) 5 MG capsule Take 1 capsule (5 mg total) by mouth at bedtime. 10/16/23     pregabalin (LYRICA) 50 MG capsule Take 50 mg by mouth 2 (two) times daily. 07/10/23   [provider]  PROLIA 60 MG/ML SOSY injection Inject 60 mg into the skin every 6 (six) months. 09/30/23   [provider]  promethazine  (PHENERGAN ) 25 MG tablet Take 1 tablet (25 mg total) by mouth every 6 (six) hours as needed for nausea or vomiting. 10/11/23   Rai, Ripudeep K, MD  tirzepatide  (MOUNJARO ) 7.5 MG/0.5ML Pen Inject 7.5 mg into the skin once a week. Patient not taking: Reported on 10/23/2023 09/28/23     tirzepatide  (MOUNJARO ) 7.5 MG/0.5ML Pen Inject 7.5 mg into the skin once a week. 10/22/23     tiZANidine  (ZANAFLEX ) 4 MG tablet Take 1 tablet (4 mg total) by mouth every six  hours as needed. Patient not taking: Reported on 10/23/2023 10/11/23   Rai, Nydia POUR, MD  tiZANidine  (ZANAFLEX ) 4 MG tablet  Take 1 tablet (4 mg total) by mouth every 6 (six) hours as needed. 10/22/23     traZODone  (DESYREL ) 150 MG tablet Take 150 mg by mouth at bedtime.    [provider]  trazodone  (DESYREL ) 300 MG tablet Take 2 tablets (600 mg total) by mouth at bedtime. 10/16/23       Allergies: Patient has no known allergies.    Review of Systems  Unable to perform ROS: Other (patient uncooperative, not answering my questions)  Musculoskeletal:  Positive for back pain.    Updated Vital Signs BP 125/88   Pulse (!) 111   Temp 97.6 F (36.4 C) (Axillary)   Resp (!) 22   LMP 06/03/2018 (Approximate) Comment: neg hcg 05/10/20  SpO2 100%   Physical Exam Vitals and nursing note reviewed.  Constitutional:      General: She is not in acute distress.    Appearance: She is well-developed.     Comments: Writhing in the bed, refusing to speak   HENT:     Head: Normocephalic and atraumatic.  Eyes:     Conjunctiva/sclera: Conjunctivae normal.  Cardiovascular:     Rate and Rhythm: Normal rate and regular rhythm.     Heart sounds: Normal heart sounds. No murmur heard. Pulmonary:     Effort: Pulmonary effort is normal. No respiratory distress.     Breath sounds: Normal breath sounds. No stridor. No wheezing or rhonchi.  Abdominal:     Palpations: Abdomen is soft.     Tenderness: There is no abdominal tenderness.     Comments: Back brace over midsection   Musculoskeletal:        General: No swelling.     Cervical back: Neck supple.  Skin:    General: Skin is warm and dry.     Capillary Refill: Capillary refill takes less than 2 seconds.  Neurological:     General: No focal deficit present.     Mental Status: She is alert.  Psychiatric:        Mood and Affect: Mood normal.     (all labs ordered are listed, but only abnormal results are displayed) Labs Reviewed  BASIC METABOLIC PANEL WITH GFR - Abnormal; Notable for the following components:      Result Value   Potassium 3.0 (*)    Glucose,  Bld 150 (*)    Creatinine, Ser 1.38 (*)    GFR, Estimated 46 (*)    All other components within normal limits  URINALYSIS, ROUTINE W REFLEX MICROSCOPIC - Abnormal; Notable for the following components:   Color, Urine AMBER (*)    APPearance CLOUDY (*)    Bilirubin Urine MODERATE (*)  Ketones, ur 5 (*)    Protein, ur 100 (*)    Bacteria, UA RARE (*)    Non Squamous Epithelial 0-5 (*)    All other components within normal limits  RAPID URINE DRUG SCREEN, HOSP PERFORMED - Abnormal; Notable for the following components:   Opiates POSITIVE (*)    Amphetamines POSITIVE (*)    All other components within normal limits  HEPATIC FUNCTION PANEL - Abnormal; Notable for the following components:   Total Protein 8.6 (*)    All other components within normal limits  CBC WITH DIFFERENTIAL/PLATELET - Abnormal; Notable for the following components:   WBC 18.4 (*)    RDW 17.9 (*)    Neutro Abs 14.9 (*)    Monocytes Absolute 1.7 (*)    Abs Immature Granulocytes 0.11 (*)    All other components within normal limits  I-STAT CG4 LACTIC ACID, ED - Abnormal; Notable for the following components:   Lactic Acid, Venous 2.9 (*)    All other components within normal limits  I-STAT CHEM 8, ED - Abnormal; Notable for the following components:   Creatinine, Ser 1.40 (*)    Glucose, Bld 169 (*)    Calcium , Ion 1.14 (*)    Hemoglobin 17.0 (*)    HCT 50.0 (*)    All other components within normal limits  CULTURE, BLOOD (SINGLE)  ETHANOL  AMMONIA  LIPASE, BLOOD  CBC WITH DIFFERENTIAL/PLATELET  CBG MONITORING, ED  I-STAT CG4 LACTIC ACID, ED  TROPONIN I (HIGH SENSITIVITY)  TROPONIN I (HIGH SENSITIVITY)    EKG: None  Radiology: CT Head Wo Contrast Result Date: 10/24/2023 CLINICAL DATA:  Altered mental status. EXAM: CT HEAD WITHOUT CONTRAST TECHNIQUE: Contiguous axial images were obtained from the base of the skull through the vertex without intravenous contrast. RADIATION DOSE REDUCTION: This exam  was performed according to the departmental dose-optimization program which includes automated exposure control, adjustment of the mA and/or kV according to patient size and/or use of iterative reconstruction technique. COMPARISON:  CT head 06/25/2022. FINDINGS: Brain: No acute intracranial hemorrhage. No CT evidence of acute infarct. No edema, mass effect, or midline shift. The basilar cisterns are patent. There is mild asymmetric prominence of the left sylvian fissure suggestive of mild frontotemporal volume loss similar to prior. Ventricles: The ventricles are normal. Vascular: No hyperdense vessel or unexpected calcification. Skull: No acute or aggressive finding. Orbits: Orbits are symmetric. Sinuses: The visualized paranasal sinuses are clear. Other: Mastoid air cells are clear. IMPRESSION: No CT evidence of acute intracranial abnormality. Electronically Signed   By: Donnice Mania M.D.   On: 10/24/2023 16:47   DG Lumbar Spine Complete Result Date: 10/24/2023 CLINICAL DATA:  back pain EXAM: LUMBAR SPINE - COMPLETE 4+ VIEW COMPARISON:  Sep 11, 2023, July 27, 2023 FINDINGS: Diffuse osteopenia. Five non rib-bearing lumbar type vertebral bodies. Straightening of the normal lumbar lordosis without spondylolisthesis. Similar appearance of the burst fracture involving T12. Unchanged chronic compression fracture of L4 with superior endplate concavity along L3. Subtle endplate concavity along the superior endplate of L5 with sclerosis. Moderate intervertebral disc height loss at L5-S1. no pars interarticularis defects. The soft tissues are otherwise unremarkable. IMPRESSION: 1. Diffuse osteopenia. Subtle endplate concavity along the superior endplate of L5 with underlying sclerosis. This could reflect imaging artifact versus a subtle acute compression fracture. Correlation with point tenderness recommended. 2. Similar appearance of the burst fracture of T12. Electronically Signed   By: Rogelia Myers M.D.   On:  10/24/2023 16:26  DG Chest 1 View Result Date: 10/24/2023 CLINICAL DATA:  back pain EXAM: CHEST  1 VIEW COMPARISON:  October 08, 2023 FINDINGS: Overlying metallic structure, possibly a external brace. Elevation of the right hemidiaphragm. Streaky atelectasis again noted in the right lung base. No new airspace consolidation, pleural effusion, or pneumothorax. No cardiomegaly.Partially visualized compression fracture in the lower thoracic spine. IMPRESSION: No acute cardiopulmonary abnormality. Electronically Signed   By: Rogelia Myers M.D.   On: 10/24/2023 16:22     Procedures   Medications Ordered in the ED  potassium chloride  10 mEq in 100 mL IVPB (10 mEq Intravenous New Bag/Given 10/24/23 2103)  lactated ringers  bolus 1,140 mL (1,140 mLs Intravenous New Bag/Given 10/24/23 2034)  metroNIDAZOLE  (FLAGYL ) IVPB 500 mg (500 mg Intravenous New Bag/Given 10/24/23 2104)  vancomycin  (VANCOCIN ) IVPB 1000 mg/200 mL premix (has no administration in time range)  sodium chloride  0.9 % bolus 500 mL (0 mLs Intravenous Stopped 10/24/23 1817)  lactated ringers  bolus 1,000 mL (1,000 mLs Intravenous New Bag/Given 10/24/23 1928)  labetalol  (NORMODYNE ) injection 10 mg (10 mg Intravenous Given 10/24/23 2032)  ceFEPIme  (MAXIPIME ) 2 g in sodium chloride  0.9 % 100 mL IVPB (2 g Intravenous New Bag/Given 10/24/23 2029)                                    Medical Decision Making Medical Decision Making Nursing notes are reviewed. Differential diagnosis for this patient would include but not limited to: Drug overdose, drug abuse, sepsis, UTI, dehydration, intracranial hemorrhage, other   Cardiac monitor interpretation: Sinus tachycardia, no ectopy  Emergency Department Course:  Vital signs and pulse oximetry are reviewed, evaluated by myself and found to be within normal limits prior to final disposition. Findings of laboratory testing and medical imaging are discussed with patient and family that is available. Patient agrees  with the medical care plan as follows:  On arrival patient is moaning and moving around in the bed.  She did become more responsive throughout her stay started to wake up but still was not following commands or answering questions appropriately.  Labs were reviewed and she does meet sepsis criteria although there is no source at this time.  Was treated with 30 cc/kg of IV fluid and started on broad-spectrum antibiotics.  Her tachycardia did improve with IV fluids.  She remained hypertensive and was given 1 dose of labetalol .  Labs also revealed that she is positive for amphetamines and opioids which I think is likely contributing to her altered mental status.  She is also dehydrated and hemoconcentrated.  Discussed patient's case with hospitalist and patient be admitted for further workup and management.  Critical care time:  The high probability of sudden, clincially significant deterioration of the pt's condition required the highest level of my preparedness to intervene urgently.  78 minutes of critical care time spent managing the pt's case which includes urgent interventions required to prevent body system deterioration or permanent bodily impairment, direct contact evaluating and re-evaluating the pt, treating sxs, reviewing labs and studies, speaking with pt's family, and consults. Time also includes documentation of the above. This time excludes time spent on any separately reported billable procedures and teaching.   Problems Addressed: AKI (acute kidney injury) (HCC): acute illness or injury that poses a threat to life or bodily functions Altered mental status, unspecified altered mental status type: undiagnosed new problem with uncertain prognosis Dehydration: acute illness or injury that  poses a threat to life or bodily functions Polysubstance abuse (HCC): undiagnosed new problem with uncertain prognosis Sepsis, due to unspecified organism, unspecified whether acute organ dysfunction  present [facility]): acute illness or injury that poses a threat to life or bodily functions  Amount and/or Complexity of Data Reviewed External Data Reviewed: notes.    Details: Previous outpatient records reviewed and patient was getting labs drawn today for back procedure in 2 weeks Labs: ordered. Decision-making details documented in ED Course.    Details: Ordered and interpreted by me.  Patient with initial elevated hemoglobin and AKI.  Think she is likely dehydrated.  Once CBC eventually came back this had improved after fluids but she did have an elevated lactic acid and leukocytosis.  Patient also with UDS positive for amphetamines and opiates Radiology: ordered and independent interpretation performed. Decision-making details documented in ED Course.    Details: Imaging ordered and interpreted by me independently of radiology  Chest xray: no acute abnormality Lumbar x-ray chronic changes, no new fracture or bony abnormality  CT head: shows no acute abnormality  ECG/medicine tests: ordered and independent interpretation performed. Decision-making details documented in ED Course.    Details: Interviewed by me in the absence of cardiology and shows sinus tachycardia but no STEMI or acute abnormality  Risk Prescription drug management. Drug therapy requiring intensive monitoring for toxicity. Decision regarding hospitalization. Diagnosis or treatment significantly limited by social determinants of health.    Final diagnoses:  Altered mental status, unspecified altered mental status type  Sepsis, due to unspecified organism, unspecified whether acute organ dysfunction present [facility])  AKI (acute kidney injury) (HCC)  Dehydration  Polysubstance abuse [facility])    ED Discharge Orders     None          Gennaro Duwaine CROME, DO 10/24/23 2117

## 2023-10-24 NOTE — ED Triage Notes (Signed)
 Pt arrives via POV. Pt was having preop bloodwork for an upcoming back surgery. Pt was a difficult stick. She was sent to ED due to extreme lower back pain and feeling really anxious.

## 2023-10-24 NOTE — ED Notes (Signed)
 Please update husband, # under emergency contacts.

## 2023-10-24 NOTE — ED Notes (Signed)
 Patient transported to CT

## 2023-10-25 ENCOUNTER — Inpatient Hospital Stay (HOSPITAL_COMMUNITY)

## 2023-10-25 DIAGNOSIS — R6510 Systemic inflammatory response syndrome (SIRS) of non-infectious origin without acute organ dysfunction: Secondary | ICD-10-CM | POA: Diagnosis not present

## 2023-10-25 DIAGNOSIS — S22080G Wedge compression fracture of T11-T12 vertebra, subsequent encounter for fracture with delayed healing: Secondary | ICD-10-CM

## 2023-10-25 DIAGNOSIS — M5442 Lumbago with sciatica, left side: Secondary | ICD-10-CM

## 2023-10-25 DIAGNOSIS — G929 Unspecified toxic encephalopathy: Secondary | ICD-10-CM | POA: Diagnosis not present

## 2023-10-25 DIAGNOSIS — G8929 Other chronic pain: Secondary | ICD-10-CM

## 2023-10-25 DIAGNOSIS — N179 Acute kidney failure, unspecified: Secondary | ICD-10-CM

## 2023-10-25 LAB — TSH: TSH: 0.347 u[IU]/mL — ABNORMAL LOW (ref 0.350–4.500)

## 2023-10-25 LAB — BASIC METABOLIC PANEL WITH GFR
Anion gap: 12 (ref 5–15)
BUN: 5 mg/dL — ABNORMAL LOW (ref 6–20)
CO2: 22 mmol/L (ref 22–32)
Calcium: 8.1 mg/dL — ABNORMAL LOW (ref 8.9–10.3)
Chloride: 107 mmol/L (ref 98–111)
Creatinine, Ser: 0.63 mg/dL (ref 0.44–1.00)
GFR, Estimated: >60 mL/min (ref >60)
Glucose, Bld: 121 mg/dL — ABNORMAL HIGH (ref 70–99)
Potassium: 4 mmol/L (ref 3.5–5.1)
Sodium: 141 mmol/L (ref 135–145)

## 2023-10-25 LAB — RESP PANEL BY RT-PCR (RSV, FLU A&B, COVID)  RVPGX2
Influenza A by PCR: NEGATIVE
Influenza B by PCR: NEGATIVE
Resp Syncytial Virus by PCR: NEGATIVE
SARS Coronavirus 2 by RT PCR: NEGATIVE

## 2023-10-25 LAB — BLOOD GAS, VENOUS
Acid-Base Excess: 2 mmol/L (ref 0.0–2.0)
Bicarbonate: 27.8 mmol/L (ref 20.0–28.0)
Drawn by: 66579
O2 Saturation: 82 %
Patient temperature: 36.8
pCO2, Ven: 47 mmHg (ref 44–60)
pH, Ven: 7.38 (ref 7.25–7.43)
pO2, Ven: 45 mmHg (ref 32–45)

## 2023-10-25 LAB — VITAMIN D 25 HYDROXY (VIT D DEFICIENCY, FRACTURES): Vit D, 25-Hydroxy: 10.75 ng/mL — ABNORMAL LOW (ref 30–100)

## 2023-10-25 LAB — CBC
HCT: 34.9 % — ABNORMAL LOW (ref 36.0–46.0)
Hemoglobin: 11.1 g/dL — ABNORMAL LOW (ref 12.0–15.0)
MCH: 27.6 pg (ref 26.0–34.0)
MCHC: 31.8 g/dL (ref 30.0–36.0)
MCV: 86.8 fL (ref 80.0–100.0)
Platelets: 260 K/uL (ref 150–400)
RBC: 4.02 MIL/uL (ref 3.87–5.11)
RDW: 17.9 % — ABNORMAL HIGH (ref 11.5–15.5)
WBC: 14.8 K/uL — ABNORMAL HIGH (ref 4.0–10.5)
nRBC: 0 % (ref 0.0–0.2)

## 2023-10-25 LAB — PHOSPHORUS: Phosphorus: 2.4 mg/dL — ABNORMAL LOW (ref 2.5–4.6)

## 2023-10-25 LAB — VITAMIN B12: Vitamin B-12: 531 pg/mL (ref 180–914)

## 2023-10-25 LAB — MAGNESIUM: Magnesium: 1.6 mg/dL — ABNORMAL LOW (ref 1.7–2.4)

## 2023-10-25 LAB — SEDIMENTATION RATE: Sed Rate: 47 mm/h — ABNORMAL HIGH (ref 0–22)

## 2023-10-25 LAB — T4, FREE: Free T4: 1.2 ng/dL — ABNORMAL HIGH (ref 0.61–1.12)

## 2023-10-25 LAB — C-REACTIVE PROTEIN: CRP: 5.5 mg/dL — ABNORMAL HIGH (ref <1.0)

## 2023-10-25 MED ORDER — GADOBUTROL 1 MMOL/ML IV SOLN
9.0000 mL | Freq: Once | INTRAVENOUS | Status: AC | PRN
  Administered 2023-10-25: 9 mL via INTRAVENOUS

## 2023-10-25 MED ORDER — MAGNESIUM SULFATE 2 GM/50ML IV SOLN
2.0000 g | Freq: Once | INTRAVENOUS | Status: AC
  Administered 2023-10-25: 2 g via INTRAVENOUS
  Filled 2023-10-25: qty 50

## 2023-10-25 MED ORDER — VANCOMYCIN HCL 1250 MG/250ML IV SOLN
1250.0000 mg | INTRAVENOUS | Status: DC
  Administered 2023-10-26: 1250 mg via INTRAVENOUS
  Filled 2023-10-25: qty 250

## 2023-10-25 MED ORDER — TIZANIDINE HCL 2 MG PO TABS
4.0000 mg | ORAL_TABLET | Freq: Three times a day (TID) | ORAL | Status: DC
  Administered 2023-10-25 – 2023-10-26 (×3): 4 mg via ORAL
  Filled 2023-10-25 (×3): qty 2

## 2023-10-25 MED ORDER — VITAMIN D (ERGOCALCIFEROL) 1.25 MG (50000 UNIT) PO CAPS
50000.0000 [IU] | ORAL_CAPSULE | ORAL | Status: DC
  Administered 2023-10-25: 50000 [IU] via ORAL
  Filled 2023-10-25: qty 1

## 2023-10-25 MED ORDER — FENTANYL CITRATE PF 50 MCG/ML IJ SOSY
12.5000 ug | PREFILLED_SYRINGE | INTRAMUSCULAR | Status: AC | PRN
  Administered 2023-10-25 – 2023-10-26 (×2): 12.5 ug via INTRAVENOUS
  Filled 2023-10-25 (×2): qty 1

## 2023-10-25 MED ORDER — LIDOCAINE 5 % EX PTCH
2.0000 | MEDICATED_PATCH | CUTANEOUS | Status: AC
  Administered 2023-10-25: 2 via TRANSDERMAL
  Filled 2023-10-25: qty 2

## 2023-10-25 MED ORDER — TIZANIDINE HCL 2 MG PO TABS: 4.0000 mg | ORAL_TABLET | Freq: Three times a day (TID) | ORAL | Status: DC

## 2023-10-25 MED ORDER — ONDANSETRON HCL 4 MG/2ML IJ SOLN
4.0000 mg | Freq: Three times a day (TID) | INTRAMUSCULAR | Status: DC | PRN
  Administered 2023-10-25 – 2023-10-26 (×2): 4 mg via INTRAVENOUS
  Filled 2023-10-25 (×2): qty 2

## 2023-10-25 MED ORDER — MELATONIN 5 MG PO TABS
5.0000 mg | ORAL_TABLET | Freq: Once | ORAL | Status: AC
  Administered 2023-10-25: 5 mg via ORAL
  Filled 2023-10-25: qty 1

## 2023-10-25 NOTE — Progress Notes (Signed)
 PT Cancellation Note  Patient Details Name: [name] MRN: [id number] DOB: [birth year]   Cancelled Treatment:    Reason Eval/Treat Not Completed: Patient declined, no reason specified spouse just arrived with food, pt declining PT at the moment. Will attempt to return if time/schedule allow.   Josette Rough, PT, DPT 10/25/23 2:07 PM

## 2023-10-25 NOTE — Evaluation (Signed)
 Physical Therapy Evaluation Patient Details Name: [name] MRN: [id number] DOB: 01/13/1971 Today's Date: 10/25/2023  History of Present Illness  53 yo F who presented on 10/24/23 from pain medicine clinic due to AMS. Admitted with acute encephalopathy, possible sepsis. PMH cardiac arrest 2022, anoxic brain injury, HTN, HLD, chronic compression fractures, chronic pain, mood disorder, DM, ankle fx s/p ORIF  Clinical Impression   Received in bed, pleasant and cooperative. TLSO in room but we may need to see if ortho techs can bring another one, or at least an extender because the fit on this one is questionable- limited activity due to this. Able to mobilize with min guard but needed cuing for back precautions, very easily fatigued and spouse reports she is mostly in bed at home, very sedentary. Left up in recliner with all needs met, chair alarm active and nursing staff aware of pt status.         If plan is discharge home, recommend the following: Help with stairs or ramp for entrance;Assist for transportation;Assistance with cooking/housework;A little help with walking and/or transfers;Direct supervision/assist for medications management;A little help with bathing/dressing/bathroom   Can travel by private vehicle        Equipment Recommendations Rolling walker (2 wheels);BSC/3in1  Recommendations for Other Services       Functional Status Assessment Patient has had a recent decline in their functional status and demonstrates the ability to make significant improvements in function in a reasonable and predictable amount of time.     Precautions / Restrictions Precautions Precautions: Fall;Back Precaution Booklet Issued: No Recall of Precautions/Restrictions: Impaired Precaution/Restrictions Comments: back precautions due to compression fractures, needs TLSO for OOB Restrictions Weight Bearing Restrictions Per Provider Order: No      Mobility  Bed Mobility Overal bed  mobility: Needs Assistance Bed Mobility: Rolling, Sidelying to Sit Rolling: Modified independent (Device/Increase time) Sidelying to sit: Supervision       General bed mobility comments: cues for correct sequencing with bed mobility, had surgery planned on her back for 7/16 but not sure if that will still happen given the infection    Transfers Overall transfer level: Needs assistance Equipment used: Rolling walker (2 wheels) Transfers: Sit to/from Stand Sit to Stand: Contact guard assist                Ambulation/Gait Ambulation/Gait assistance: Contact guard assist Gait Distance (Feet): 5 Feet Assistive device: Rolling walker (2 wheels) Gait Pattern/deviations: Step-through pattern, Decreased step length - right, Decreased step length - left Gait velocity: decreased     General Gait Details: easily fatigued, spouse reports she is mostly in the bed at home and very sedentary; spO2 94% on RA  Stairs            Wheelchair Mobility     Tilt Bed    Modified Rankin (Stroke Patients Only)       Balance Overall balance assessment: No apparent balance deficits (not formally assessed), History of Falls                                           Pertinent Vitals/Pain Pain Assessment Pain Assessment: 0-10 Pain Score: 3  Pain Location: back Pain Descriptors / Indicators: Aching, Discomfort, Sore Pain Intervention(s): Limited activity within patient's tolerance, Monitored during session    Home Living Family/patient expects to be discharged to:: Private residence Living Arrangements: Spouse/significant other Available  Help at Discharge: Family Type of Home: House Home Access: Stairs to enter Entrance Stairs-Rails: Right Entrance Stairs-Number of Steps: 3-4   Home Layout: One level Home Equipment: Agricultural consultant (2 wheels)      Prior Function               Mobility Comments: uses RW for longer distance, wears back brace        Extremity/Trunk Assessment   Upper Extremity Assessment Upper Extremity Assessment: Defer to OT evaluation    Lower Extremity Assessment Lower Extremity Assessment: Generalized weakness    Cervical / Trunk Assessment Cervical / Trunk Assessment: Kyphotic  Communication   Communication Communication: No apparent difficulties    Cognition Arousal: Alert Behavior During Therapy: WFL for tasks assessed/performed, Flat affect   PT - Cognitive impairments: Problem solving, Sequencing, Safety/Judgement                       PT - Cognition Comments: needed cues for back precautions and assist correctly donning TLSO Following commands: Intact       Cueing Cueing Techniques: Verbal cues     General Comments      Exercises     Assessment/Plan    PT Assessment Patient needs continued PT services  PT Problem List Decreased strength;Decreased cognition;Obesity;Decreased activity tolerance;Decreased safety awareness;Decreased balance;Decreased mobility;Decreased coordination       PT Treatment Interventions DME instruction;Patient/family education;Gait training;Stair training;Functional mobility training;Manual techniques;Therapeutic activities;Therapeutic exercise;Balance training;Neuromuscular re-education    PT Goals (Current goals can be found in the Care Plan section)  Acute Rehab PT Goals Patient Stated Goal: go home, hopefully still have surgery PT Goal Formulation: With patient/family Time For Goal Achievement: 11/08/23 Potential to Achieve Goals: Fair    Frequency Min 3X/week     Co-evaluation               AM-PAC PT 6 Clicks Mobility  Outcome Measure Help needed turning from your back to your side while in a flat bed without using bedrails?: None Help needed moving from lying on your back to sitting on the side of a flat bed without using bedrails?: None Help needed moving to and from a bed to a chair (including a wheelchair)?: A  Little Help needed standing up from a chair using your arms (e.g., wheelchair or bedside chair)?: A Little Help needed to walk in hospital room?: A Little Help needed climbing 3-5 steps with a railing? : A Little 6 Click Score: 20    End of Session Equipment Utilized During Treatment: Back brace Activity Tolerance: Patient tolerated treatment well;Patient limited by fatigue Patient left: in chair;with call bell/phone within reach;with chair alarm set Nurse Communication: Mobility status;Precautions;Other (comment) (TLSO) PT Visit Diagnosis: Other abnormalities of gait and mobility (R26.89);Muscle weakness (generalized) (M62.81);Pain Pain - Right/Left:  (back) Pain - part of body:  (back)    Time: 1435-1500 PT Time Calculation (min) (ACUTE ONLY): 25 min   Charges:   PT Evaluation $PT Eval Moderate Complexity: 1 Mod PT Treatments $Therapeutic Activity: 8-22 mins PT General Charges $$ ACUTE PT VISIT: 1 Visit        Josette Rough, PT, DPT 10/25/23 3:16 PM

## 2023-10-25 NOTE — Progress Notes (Signed)
 Pharmacy Antibiotic Note  [name] is a 53 y.o. female admitted on 10/24/2023 with AMS and concerns for sepsis of unknown source.  Pharmacy has been consulted for vancomycin  dosing.  AKI resolved. Scr down to 0.63. We will adjust vanc.  Plan: -Ceftriaxone  2g IV every 24 hours -Change vanc to 1250mg  q24 (AUC 448, sCr 0.8)  -Follow up signs of clinical improvement, LOT, de-escalation of antibiotics      Temp (24hrs), Avg:98.1 F (36.7 C), Min:97.6 F (36.4 C), Max:98.7 F (37.1 C)  Recent Labs  Lab 10/24/23 1644 10/24/23 1702 10/24/23 1925 10/24/23 1943 10/25/23 0702  WBC  --   --  18.4*  --  14.8*  CREATININE 1.40* 1.38*  --   --  0.63  LATICACIDVEN 2.9*  --   --  1.8  --     Estimated Creatinine Clearance: 88.4 mL/min (by C-G formula based on SCr of 0.63 mg/dL).    No Known Allergies  Antimicrobials this admission: Ceftriaxone  7/4 >>  Vancomycin  7/3 >>   Microbiology results: 7/3 BCx: ordered  Thank you for allowing pharmacy to be a part of this patient's care.  Lynwood Poplar, PharmD, BCPS Clinical Pharmacist 10/25/2023 1:35 PM

## 2023-10-25 NOTE — Plan of Care (Signed)

## 2023-10-25 NOTE — Progress Notes (Signed)
 Patient refused to take requested medication.

## 2023-10-25 NOTE — Plan of Care (Signed)
   Problem: Education: Goal: Knowledge of General Education information will improve Description: Including pain rating scale, medication(s)/side effects and non-pharmacologic comfort measures Outcome: Progressing   Problem: Activity: Goal: Risk for activity intolerance will decrease Outcome: Progressing   Problem: Skin Integrity: Goal: Risk for impaired skin integrity will decrease Outcome: Progressing

## 2023-10-25 NOTE — Progress Notes (Signed)
 PROGRESS NOTE  [name] FMW:[id number] DOB: 06/01/1970   PCP: [name], PA-C  Patient is from: Home.  Ambulates with rolling walker.  DOA: 10/24/2023 LOS: 1  Chief complaints Chief Complaint  Patient presents with   Back Pain   Anxiety     Brief Narrative / Interim history: 53 year old F with PMH of prior cardiac arrest in 2022 with anoxic brain injury, pulmonary hypertension, HFpEF, aortic regurgitation, HTN, HLD, hypothyroidism, Barrett's esophagus, chronic compression fracture of L4 and T12 with chronic back pain, mood disorder and osteoporosis brought to ED from pain medicine clinic due to altered mental status.  Per admitting provider, patient had perseverative speech and was not able to provide history on arrival.  Per anesthesia note,  She was very restless during blood draw attempts, and kept moving her arms, repositioning, and facial wincing/grimacing. She also has small veins. After another unsuccessful lab draw attempt I continued to talk with her and her husband, but felt she was tiring out and dozing off, although still easily arousable to voice . Husband requested transport to ED for further evaluation. Discussed with husband over the phone, seemed at her normal before this clinic appointment. However did have nausea and vomiting. Husband not sure if she had maybe taken too many of her home medications.    In ED, slightly tachycardic with mildly elevated diastolic BP. Cr 1.4.  K3.0.  Serial troponin negative.  Lactic acid 2.9 and 1.8.SABRA  WBC 18.4. UA without significant finding.  Ammonia level normal.  CXR and CT head without acute finding.  UDS positive for amphetamines and cocaine.  COVID-19, influenza and RSV PCR nonreactive.  Patient was admitted for encephalopathy, AKI and SIRS.  Blood cultures obtained and started on broad-spectrum antibiotics as well.  The next day, encephalopathy resolved.  VBG basically normal.  CRP elevated to 5.5.  ESR 47.   Leukocytosis improved.  Thoracic and lumbar MRI ordered.   Subjective: Seen and examined earlier this morning.  No major events overnight or this morning.  She is sleepy but wakes to voice.  She is oriented x 4.  No recollection into why she was in the hospital.  She stated that she was about to have her injection for osteoporosis and does not recall what happened next.  Reports some back pain that has not changed from baseline.  She reports some numbness in her left leg which is also not new.  She denies bowel or bladder habit change.  Objective: Vitals:   10/25/23 0030 10/25/23 0115 10/25/23 0221 10/25/23 0857  BP:  105/60 (!) 151/113 98/64  Pulse: 97 (!) 107 (!) 110 (!) 103  Resp: 18 (!) 22 18 16   Temp:  97.7 F (36.5 C) 98.3 F (36.8 C) 98.2 F (36.8 C)  TempSrc:   Oral Oral  SpO2: 100% 100% 99% 99%    Examination:  GENERAL: No apparent distress.  Nontoxic. HEENT: MMM.  Vision and hearing grossly intact.  NECK: Supple.  No apparent JVD.  RESP:  No IWOB.  Fair aeration bilaterally. CVS:  RRR. Heart sounds normal.  ABD/GI/GU: BS+. Abd soft, NTND.  MSK/EXT:  Moves extremities. No apparent deformity. No edema.  SKIN: no apparent skin lesion or wound NEURO: Sleepy but wakes to voice.  Oriented x 4.  No apparent focal neuro deficit. PSYCH: Calm. Normal affect.   Consultants:  None  Procedures: None  Microbiology summarized: COVID-19, influenza and RSV PCR nonreactive Single set blood culture pending  Assessment and plan: Acute  toxic encephalopathy: Suspect this to be iatrogenic.  Patient is at risk for polypharmacy on multiple sedating medications (Klonopin , oxycodone , Lyrica , Zanaflex  and trazodone ).  UDS positive for opiate and amphetamine.  Ammonia, UA, CXR and CT head are unrevealing.  VBG normal but obtained late.  TSH is slightly low.  She has some leukocytosis with mild lactic acidosis and tachycardia on presentation. Some question about possible infection/SIRS and  started on broad-spectrum antibiotics.  Also question about dehydration and she was started on IV fluid.  Encephalopathy and AKI resolved.  Leukocytosis improved.  CRP elevated to 5.5 with ESR of 47. -Continue vancomycin  and ceftriaxone  pending blood cultures -Obtain thoracic or lumbar MRI to exclude back infection. -Discussed about risk for polypharmacy-she is willing to forego Klonopin .  She says she does not take baclofen .  I have discontinued Klonopin  and baclofen . -Oxycodone  and trazodone  adjusted.  Continue current dose. -Doubt seizure or stroke. -Reorientation and delirium precaution    SIRS: Has tachycardia, leukocytosis with lactic acidosis on presentation.  Unclear source of infection.  She has back pain but in the setting of known compression fractures.  Lumbar film did not show anything acute other than osteopenia and compression fractures.  His CRP is elevated to 5.5 with ESR of 47.  She is scheduled for back surgery on 11/06/2023 with Dr. Joshua. -Continue antibiotics as above -MRI thoracic and lumbar spine as above   AKI: b/l creatinine 0.5.  Likely due to dehydration.  Resolved. Recent Labs    08/10/23 0039 08/10/23 0529 10/08/23 0247 10/08/23 0908 10/09/23 0547 10/10/23 9386 10/11/23 0935 10/24/23 1644 10/24/23 1702 10/25/23 0702  BUN 9 7 10 7  <5* 5* <5* 7 7 5*  CREATININE 0.88 0.69 1.23* 0.82 0.60 0.52 0.52 1.40* 1.38* 0.63  -Continue IV fluid for now   Choreiform movement?  restless in legs but able to control stopped.  She is attributing this to pain. -Agree with  discontinuing Reglan  -Pain control as above. -MRI back as above   Chronic compression fracture L4, T12,  chronic back pain Possible L5 superior endplate fracture versus artifact - MRI lumbar and thoracic spine/laminectomy as above - She is scheduled for back surgery on 7/16 with neurosurgery. -Pain control as above - PT/OT eval  Polypharmacy: On multiple sedating medications (Klonopin , oxycodone ,  Lyrica , Zanaflex  and trazodone ) - Adjusted meds as above.   History of cardiac arrest '22 with anoxic brain injury: Noted Chronic diastolic CHF/PAH/AR: To have repeat echo outpatient for preop testing for laminectomy. Essential hypertension: Continue home carvedilol  12.5 mg twice daily Hyperlipidemia: Not on cholesterol-lowering agents Hypothyroidism: TSH slightly low.  Check free T4.  Continue Synthroid . Barrets esophagus: Continue pantoprazole  twice daily  Mood d/o: Continue home aripiprazole , duloxetine , prazosin .  Trazodone  reduced due to encephalopathy.  There is no height or weight on file to calculate BMI.          DVT prophylaxis:  SCDs Start: 10/24/23 2129  Code Status: Full code Family Communication: None at bedside Level of care: Telemetry Medical Status is: Inpatient Remains inpatient appropriate because: SIRS, encephalopathy and back pain   Final disposition: To be determined after PT/OT   55 minutes with more than 50% spent in reviewing records, counseling patient/family and coordinating care.   Sch Meds:  Scheduled Meds:  ARIPiprazole   10 mg Oral Daily   atorvastatin   20 mg Oral Daily   carvedilol   12.5 mg Oral BID WC   DULoxetine   60 mg Oral BID   levothyroxine   50 mcg Oral QAC  breakfast   multivitamin  1 tablet Oral QHS   pantoprazole   40 mg Oral BID   prazosin   5 mg Oral QHS   pregabalin   50 mg Oral BID   sodium chloride  flush  3 mL Intravenous Q12H   tiZANidine   4 mg Oral TID   trazodone   300 mg Oral QHS   vancomycin  variable dose per unstable renal function (pharmacist dosing)   Does not apply See admin instructions   Continuous Infusions:  cefTRIAXone  (ROCEPHIN )  IV 2 g (10/25/23 0954)   lactated ringers  100 mL/hr at 10/25/23 0541   magnesium  sulfate bolus IVPB     PRN Meds:.acetaminophen , ondansetron  (ZOFRAN ) IV, oxyCODONE  **OR** oxyCODONE   Antimicrobials: Anti-infectives (From admission, onward)    Start     Dose/Rate Route Frequency  Ordered Stop   10/25/23 0800  cefTRIAXone  (ROCEPHIN ) 2 g in sodium chloride  0.9 % 100 mL IVPB        2 g 200 mL/hr over 30 Minutes Intravenous Every 24 hours 10/24/23 2132     10/24/23 2234  vancomycin  variable dose per unstable renal function (pharmacist dosing)         Does not apply See admin instructions 10/24/23 2234     10/24/23 2215  vancomycin  (VANCOREADY) IVPB 2000 mg/400 mL        2,000 mg 200 mL/hr over 120 Minutes Intravenous  Once 10/24/23 2203 10/25/23 0144   10/24/23 2015  ceFEPIme  (MAXIPIME ) 2 g in sodium chloride  0.9 % 100 mL IVPB        2 g 200 mL/hr over 30 Minutes Intravenous  Once 10/24/23 2013 10/24/23 2059   10/24/23 2015  metroNIDAZOLE  (FLAGYL ) IVPB 500 mg        500 mg 100 mL/hr over 60 Minutes Intravenous  Once 10/24/23 2013 10/24/23 2232   10/24/23 2015  vancomycin  (VANCOCIN ) IVPB 1000 mg/200 mL premix  Status:  Discontinued        1,000 mg 200 mL/hr over 60 Minutes Intravenous  Once 10/24/23 2013 10/24/23 2203        I have personally reviewed the following labs and images: CBC: Recent Labs  Lab 10/24/23 1644 10/24/23 1925 10/25/23 0702  WBC  --  18.4* 14.8*  NEUTROABS  --  14.9*  --   HGB 17.0* 13.2 11.1*  HCT 50.0* 42.3 34.9*  MCV  --  86.7 86.8  PLT  --  338 260   BMP &GFR Recent Labs  Lab 10/24/23 1644 10/24/23 1702 10/25/23 0702  NA 144 142 141  K 3.6 3.0* 4.0  CL 103 104 107  CO2  --  23 22  GLUCOSE 169* 150* 121*  BUN 7 7 5*  CREATININE 1.40* 1.38* 0.63  CALCIUM   --  9.1 8.1*  MG  --   --  1.6*  PHOS  --   --  2.4*   Estimated Creatinine Clearance: 88.4 mL/min (by C-G formula based on SCr of 0.63 mg/dL). Liver & Pancreas: Recent Labs  Lab 10/24/23 1701  AST 24  ALT 13  ALKPHOS 114  BILITOT 0.8  PROT 8.6*  ALBUMIN  3.6   Recent Labs  Lab 10/24/23 1701  LIPASE 19   Recent Labs  Lab 10/24/23 1701  AMMONIA 24   Diabetic: Recent Labs    10/24/23 1609  HGBA1C 5.8*   Recent Labs  Lab 10/24/23 1016  GLUCAP  188*   Cardiac Enzymes: No results for input(s): CKTOTAL, CKMB, CKMBINDEX, TROPONINI in the last 168 hours. No results for  input(s): PROBNP in the last 8760 hours. Coagulation Profile: No results for input(s): INR, PROTIME in the last 168 hours. Thyroid  Function Tests: Recent Labs    10/25/23 0702  TSH 0.347*   Lipid Profile: No results for input(s): CHOL, HDL, LDLCALC, TRIG, CHOLHDL, LDLDIRECT in the last 72 hours. Anemia Panel: Recent Labs    10/25/23 0702  VITAMINB12 531   Urine analysis:    Component Value Date/Time   COLORURINE AMBER (A) 10/24/2023 1741   APPEARANCEUR CLOUDY (A) 10/24/2023 1741   LABSPEC 1.027 10/24/2023 1741   PHURINE 5.0 10/24/2023 1741   GLUCOSEU NEGATIVE 10/24/2023 1741   HGBUR NEGATIVE 10/24/2023 1741   BILIRUBINUR MODERATE (A) 10/24/2023 1741   KETONESUR 5 (A) 10/24/2023 1741   PROTEINUR 100 (A) 10/24/2023 1741   UROBILINOGEN 0.2 01/19/2015 0302   NITRITE NEGATIVE 10/24/2023 1741   LEUKOCYTESUR NEGATIVE 10/24/2023 1741   Sepsis Labs: Invalid input(s): PROCALCITONIN, LACTICIDVEN  Microbiology: Recent Results (from the past 240 hours)  Surgical pcr screen     Status: None   Collection Time: 10/24/23 11:10 AM   Specimen: Nasal Mucosa; Nasal Swab  Result Value Ref Range Status   MRSA, PCR NEGATIVE NEGATIVE Final   Staphylococcus aureus NEGATIVE NEGATIVE Final    Comment: (NOTE) The Xpert SA Assay (FDA approved for NASAL specimens in patients 29 years of age and older), is one component of a comprehensive surveillance program. It is not intended to diagnose infection nor to guide or monitor treatment. Performed at [facility], [address], [place], [place] [address]   Resp panel by RT-PCR (RSV, Flu A&B, Covid) Anterior Nasal Swab     Status: None   Collection Time: 10/25/23 12:10 AM   Specimen: Anterior Nasal Swab  Result Value Ref Range Status   SARS Coronavirus 2 by RT PCR NEGATIVE NEGATIVE  Final   Influenza A by PCR NEGATIVE NEGATIVE Final   Influenza B by PCR NEGATIVE NEGATIVE Final    Comment: (NOTE) The Xpert Xpress SARS-CoV-2/FLU/RSV plus assay is intended as an aid in the diagnosis of influenza from Nasopharyngeal swab specimens and should not be used as a sole basis for treatment. Nasal washings and aspirates are unacceptable for Xpert Xpress SARS-CoV-2/FLU/RSV testing.  Fact Sheet for Patients: BloggerCourse.com  Fact Sheet for Healthcare Providers: SeriousBroker.it  This test is not yet approved or cleared by the [place]  FDA and has been authorized for detection and/or diagnosis of SARS-CoV-2 by FDA under an Emergency Use Authorization (EUA). This EUA will remain in effect (meaning this test can be used) for the duration of the COVID-19 declaration under Section 564(b)(1) of the Act, 21 U.S.C. section 360bbb-3(b)(1), unless the authorization is terminated or revoked.     Resp Syncytial Virus by PCR NEGATIVE NEGATIVE Final    Comment: (NOTE) Fact Sheet for Patients: BloggerCourse.com  Fact Sheet for Healthcare Providers: SeriousBroker.it  This test is not yet approved or cleared by the [place]  FDA and has been authorized for detection and/or diagnosis of SARS-CoV-2 by FDA under an Emergency Use Authorization (EUA). This EUA will remain in effect (meaning this test can be used) for the duration of the COVID-19 declaration under Section 564(b)(1) of the Act, 21 U.S.C. section 360bbb-3(b)(1), unless the authorization is terminated or revoked.  Performed at [facility], [address] [place], [place] [address]     Radiology Studies: CT Head Wo Contrast Result Date: 10/24/2023 CLINICAL DATA:  Altered mental status. EXAM: CT HEAD WITHOUT CONTRAST TECHNIQUE:  Contiguous axial images were obtained from the base of the skull through the  vertex without intravenous contrast. RADIATION DOSE REDUCTION: This exam was performed according to the departmental dose-optimization program which includes automated exposure control, adjustment of the mA and/or kV according to patient size and/or use of iterative reconstruction technique. COMPARISON:  CT head 06/25/2022. FINDINGS: Brain: No acute intracranial hemorrhage. No CT evidence of acute infarct. No edema, mass effect, or midline shift. The basilar cisterns are patent. There is mild asymmetric prominence of the left sylvian fissure suggestive of mild frontotemporal volume loss similar to prior. Ventricles: The ventricles are normal. Vascular: No hyperdense vessel or unexpected calcification. Skull: No acute or aggressive finding. Orbits: Orbits are symmetric. Sinuses: The visualized paranasal sinuses are clear. Other: Mastoid air cells are clear. IMPRESSION: No CT evidence of acute intracranial abnormality. Electronically Signed   By: Donnice Mania M.D.   On: 10/24/2023 16:47   DG Lumbar Spine Complete Result Date: 10/24/2023 CLINICAL DATA:  back pain EXAM: LUMBAR SPINE - COMPLETE 4+ VIEW COMPARISON:  Sep 11, 2023, July 27, 2023 FINDINGS: Diffuse osteopenia. Five non rib-bearing lumbar type vertebral bodies. Straightening of the normal lumbar lordosis without spondylolisthesis. Similar appearance of the burst fracture involving T12. Unchanged chronic compression fracture of L4 with superior endplate concavity along L3. Subtle endplate concavity along the superior endplate of L5 with sclerosis. Moderate intervertebral disc height loss at L5-S1. no pars interarticularis defects. The soft tissues are otherwise unremarkable. IMPRESSION: 1. Diffuse osteopenia. Subtle endplate concavity along the superior endplate of L5 with underlying sclerosis. This could reflect imaging artifact versus a subtle acute compression fracture. Correlation with point tenderness recommended. 2. Similar appearance of the burst  fracture of T12. Electronically Signed   By: Rogelia Myers M.D.   On: 10/24/2023 16:26   DG Chest 1 View Result Date: 10/24/2023 CLINICAL DATA:  back pain EXAM: CHEST  1 VIEW COMPARISON:  October 08, 2023 FINDINGS: Overlying metallic structure, possibly a external brace. Elevation of the right hemidiaphragm. Streaky atelectasis again noted in the right lung base. No new airspace consolidation, pleural effusion, or pneumothorax. No cardiomegaly.Partially visualized compression fracture in the lower thoracic spine. IMPRESSION: No acute cardiopulmonary abnormality. Electronically Signed   By: Rogelia Myers M.D.   On: 10/24/2023 16:22      Ignace Mandigo T. Jameir Ake [facility]  If 7PM-7AM, please contact night-coverage [email or web address] 10/25/2023, 11:45 AM

## 2023-10-25 NOTE — Progress Notes (Signed)
 Patient arrived in the unit accompanied by ED tech.

## 2023-10-26 DIAGNOSIS — S22080G Wedge compression fracture of T11-T12 vertebra, subsequent encounter for fracture with delayed healing: Secondary | ICD-10-CM | POA: Diagnosis not present

## 2023-10-26 DIAGNOSIS — G929 Unspecified toxic encephalopathy: Secondary | ICD-10-CM | POA: Diagnosis not present

## 2023-10-26 DIAGNOSIS — N179 Acute kidney failure, unspecified: Secondary | ICD-10-CM | POA: Diagnosis not present

## 2023-10-26 LAB — CBC
HCT: 31.2 % — ABNORMAL LOW (ref 36.0–46.0)
Hemoglobin: 10.1 g/dL — ABNORMAL LOW (ref 12.0–15.0)
MCH: 27.5 pg (ref 26.0–34.0)
MCHC: 32.4 g/dL (ref 30.0–36.0)
MCV: 85 fL (ref 80.0–100.0)
Platelets: 250 K/uL (ref 150–400)
RBC: 3.67 MIL/uL — ABNORMAL LOW (ref 3.87–5.11)
RDW: 18.1 % — ABNORMAL HIGH (ref 11.5–15.5)
WBC: 9.6 K/uL (ref 4.0–10.5)
nRBC: 0 % (ref 0.0–0.2)

## 2023-10-26 LAB — RENAL FUNCTION PANEL
Albumin: 2.5 g/dL — ABNORMAL LOW (ref 3.5–5.0)
Anion gap: 8 (ref 5–15)
BUN: 5 mg/dL — ABNORMAL LOW (ref 6–20)
CO2: 27 mmol/L (ref 22–32)
Calcium: 7.4 mg/dL — ABNORMAL LOW (ref 8.9–10.3)
Chloride: 103 mmol/L (ref 98–111)
Creatinine, Ser: 0.62 mg/dL (ref 0.44–1.00)
GFR, Estimated: >60 mL/min (ref >60)
Glucose, Bld: 112 mg/dL — ABNORMAL HIGH (ref 70–99)
Phosphorus: 1.8 mg/dL — ABNORMAL LOW (ref 2.5–4.6)
Potassium: 3.2 mmol/L — ABNORMAL LOW (ref 3.5–5.1)
Sodium: 138 mmol/L (ref 135–145)

## 2023-10-26 LAB — MAGNESIUM: Magnesium: 2.1 mg/dL (ref 1.7–2.4)

## 2023-10-26 MED ORDER — CARVEDILOL 6.25 MG PO TABS: 6.2500 mg | ORAL_TABLET | Freq: Two times a day (BID) | ORAL | 0 refills | Status: DC

## 2023-10-26 MED ORDER — VITAMIN D (ERGOCALCIFEROL) 1.25 MG (50000 UNIT) PO CAPS: 50000.0000 [IU] | ORAL_CAPSULE | ORAL | 0 refills | Status: AC

## 2023-10-26 MED ORDER — K PHOS MONO-SOD PHOS DI & MONO 155-852-130 MG PO TABS: 500.0000 mg | ORAL_TABLET | Freq: Three times a day (TID) | ORAL | 0 refills | Status: AC

## 2023-10-26 MED ORDER — POTASSIUM CHLORIDE CRYS ER 20 MEQ PO TBCR: 40.0000 meq | EXTENDED_RELEASE_TABLET | ORAL | Status: DC

## 2023-10-26 MED ORDER — POTASSIUM CHLORIDE CRYS ER 20 MEQ PO TBCR
60.0000 meq | EXTENDED_RELEASE_TABLET | Freq: Once | ORAL | Status: AC
  Administered 2023-10-26: 60 meq via ORAL
  Filled 2023-10-26: qty 3

## 2023-10-26 MED ORDER — POTASSIUM PHOSPHATES 15 MMOLE/5ML IV SOLN
30.0000 mmol | Freq: Once | INTRAVENOUS | Status: DC
  Filled 2023-10-26: qty 10

## 2023-10-26 MED ORDER — K PHOS MONO-SOD PHOS DI & MONO 155-852-130 MG PO TABS
500.0000 mg | ORAL_TABLET | Freq: Three times a day (TID) | ORAL | Status: DC
  Administered 2023-10-26: 500 mg via ORAL
  Filled 2023-10-26: qty 2

## 2023-10-26 NOTE — TOC Transition Note (Addendum)
 Transition of Care [facility]) - Discharge Note   Patient Details  Name: [name] MRN: [id number] Date of Birth: [birth year]  [facility] CM/SW Contact:  Robynn Eileen Hoose, RN Phone Number: 10/26/2023, 9:39 AM   Clinical Narrative:   Patient is being discharged today. Spoke with patient regarding DME and HH orders. Patient does not have preference for agency. DME ordered through [place] with [employer]. Message left with Amy with [facility], in attempt to arrange [facility] PT/OT. Awaiting response.   1045: No response received from Amy with [facility]. Message sent to [facility] with [place], waiting for response.   1113: Response received from [place] with [facility]. Next available start of care will be on Thursday. Contact information placed on AVS.            Patient Goals and CMS Choice            Discharge Placement                       Discharge Plan and Services Additional resources added to the After Visit Summary for                  DME Arranged: Walker rolling DME Agency: [employer] Date DME Agency Contacted: 10/26/23 Time DME Agency Contacted: [phone] Representative spoke with at DME Agency: [place]            Social Drivers of Health (SDOH) Interventions SDOH Screenings   Food Insecurity: No Food Insecurity (10/25/2023)  Housing: Low Risk  (10/25/2023)  Transportation Needs: No Transportation Needs (10/25/2023)  Utilities: Not At Risk (10/25/2023)  Depression (PHQ2-9): Low Risk  (08/16/2020)  Tobacco Use: Low Risk  (10/24/2023)     Readmission Risk Interventions    06/29/2022   12:45 PM 05/22/2022    9:25 AM 04/27/2022    9:05 AM  Readmission Risk Prevention Plan  Transportation Screening Complete Complete Complete  Medication Review [employer]) Complete Complete Complete  PCP or Specialist appointment within 3-5 days of discharge Complete Complete Complete  HRI or Home Care Consult Complete Complete Complete  SW Recovery  Care/Counseling Consult Complete Complete Complete  Palliative Care Screening Not Applicable Not Applicable Not Applicable  Skilled Nursing Facility Not Applicable Not Applicable Not Applicable

## 2023-10-26 NOTE — Evaluation (Signed)
 Occupational Therapy Evaluation Patient Details Name: [name] MRN: [id number] DOB: 1970-05-20 Today's Date: 10/26/2023   History of Present Illness   53 yo F who presented on 10/24/23 from pain medicine clinic due to AMS. Admitted with acute encephalopathy, possible sepsis. PMH cardiac arrest 2022, anoxic brain injury, HTN, HLD, chronic compression fractures, chronic pain, mood disorder, DM, ankle fx s/p ORIF     Clinical Impressions At baseline, pt is Ind to Mod I with ADLs and performs functional mobility with intermittent use of RW. Pt reports her husbands assists with IADLs, including medication management. Pt now presents with decreased activity tolerance, mild noted balance deficits with no overt LOB, generalized B UE weakness, impaired cognition (OT uncertain of baseline), decreased knowledge of precautions, decreased knowledge of AE/DME, and decreased safety and independence with functional tasks. Pt participated well in session and reports husband is available to assist 24/7 at home. Plan for pt to discharge home today. If pt does not discharge as planned, she will benefit from acute skilled OT services to address deficits outlined below and increase safety and independence with functional tasks. Post acute discharge, pt will benefit from [facility] OT to maximize rehab potential.      If plan is discharge home, recommend the following:   A little help with walking and/or transfers;A little help with bathing/dressing/bathroom;Assistance with cooking/housework;Direct supervision/assist for medications management;Direct supervision/assist for financial management;Assist for transportation;Help with stairs or ramp for entrance     Functional Status Assessment   Patient has had a recent decline in their functional status and demonstrates the ability to make significant improvements in function in a reasonable and predictable amount of time.     Equipment Recommendations    BSC/3in1;Other (comment) (RW)     Recommendations for Other Services         Precautions/Restrictions   Precautions Precautions: Fall;Back Precaution Booklet Issued: No Recall of Precautions/Restrictions: Impaired Precaution/Restrictions Comments: back precautions due to compression fractures, needs TLSO for OOB Required Braces or Orthoses: Other Brace Other Brace: TLSO Restrictions Weight Bearing Restrictions Per Provider Order: No     Mobility Bed Mobility Overal bed mobility: Needs Assistance Bed Mobility: Rolling, Sidelying to Sit, Sit to Sidelying Rolling: Modified independent (Device/Increase time) Sidelying to sit: Supervision     Sit to sidelying: Supervision General bed mobility comments: cues for correct sequencing with bed mobility, had surgery planned on her back for 7/16 but not sure if that will still happen given the infection    Transfers Overall transfer level: Needs assistance Equipment used: Rolling walker (2 wheels) Transfers: Sit to/from Stand, Bed to chair/wheelchair/BSC Sit to Stand: Supervision     Step pivot transfers: Supervision, Contact guard assist     General transfer comment: close Supervision to Contact guard assist for safety; no overt LOB this session; no physical assist needed      Balance Overall balance assessment: Mild deficits observed, not formally tested, History of Falls (no overt LOB)                                         ADL either performed or assessed with clinical judgement   ADL Overall ADL's : Needs assistance/impaired Eating/Feeding: Independent;Sitting   Grooming: Wash/dry hands;Wash/dry face;Supervision/safety;Contact guard assist;Standing   Upper Body Bathing: Contact guard assist;Minimal assistance;Sitting;Cueing for compensatory techniques (adhering to back precautions)   Lower Body Bathing: Contact guard assist;Minimal assistance;Cueing for sequencing;Cueing for  compensatory  techniques;Cueing for back precautions;Adhering to back precautions;Sitting/lateral leans;Sit to/from stand   Upper Body Dressing : Contact guard assist;Cueing for compensatory techniques;Sitting (adhering to back precautions)   Lower Body Dressing: Supervision/safety;Contact guard assist;Cueing for back precautions;Adhering to back precautions;Sitting/lateral leans;Sit to/from stand;Cueing for safety;Cueing for compensatory techniques   Toilet Transfer: Supervision/safety;Contact guard assist;Ambulation;Regular Toilet;Rolling walker (2 wheels);Cueing for Chief of Staff Details (indicate cue type and reason): Close Supervision to CGA for safety Toileting- Clothing Manipulation and Hygiene: Supervision/safety;Contact guard assist;Sitting/lateral lean;Sit to/from stand;Cueing for compensatory techniques;Cueing for back precautions;Adhering to back precautions       Functional mobility during ADLs: Supervision/safety;Contact guard assist;Rolling walker (2 wheels);Cueing for safety (Close Supervision to CGA for safety) General ADL Comments: Pt requires Total assist to donn/doff TLSO brace. Pt requiring cues to adhere to back precautions and for compensatory strategies     Vision Baseline Vision/History: 1 Wears glasses (readers) Ability to See in Adequate Light: 0 Adequate (with glasses) Patient Visual Report: No change from baseline       Perception         Praxis         Pertinent Vitals/Pain Pain Assessment Pain Assessment: 0-10 Pain Score: 6  Pain Location: back Pain Descriptors / Indicators: Aching, Discomfort, Sore Pain Intervention(s): Limited activity within patient's tolerance, Monitored during session, Repositioned     Extremity/Trunk Assessment Upper Extremity Assessment Upper Extremity Assessment: Left hand dominant;Generalized weakness   Lower Extremity Assessment Lower Extremity Assessment: Defer to PT evaluation   Cervical / Trunk Assessment Cervical /  Trunk Assessment: Kyphotic;Other exceptions Cervical / Trunk Exceptions: chronic compression fxs   Communication Communication Communication: No apparent difficulties Factors Affecting Communication:  (mildly increased time for processing)   Cognition Arousal: Alert Behavior During Therapy: WFL for tasks assessed/performed, Flat affect Cognition: Cognition impaired, No family/caregiver present to determine baseline (Hx of anoxic brain injury)     Awareness: Intellectual awareness intact, Online awareness intact Memory impairment (select all impairments): Declarative long-term memory, Working memory Attention impairment (select first level of impairment): Alternating attention Executive functioning impairment (select all impairments): Organization, Problem solving, Reasoning, Sequencing OT - Cognition Comments: Pt AAOx4 and pleasant throughout session with noted cognitive deficits as described above                 Following commands: Intact       Cueing  General Comments   Cueing Techniques: Verbal cues  reviewed back precautions   Exercises     Shoulder Instructions      Home Living Family/patient expects to be discharged to:: Private residence Living Arrangements: Spouse/significant other Available Help at Discharge: Family;Available 24 hours/day Type of Home: House Home Access: Stairs to enter [employer] of Steps: 3-4 Entrance Stairs-Rails: Right Home Layout: One level     Bathroom Shower/Tub: Chief Strategy Officer: Standard (with tolet riser)     Home Equipment: Rolling Walker (2 wheels);Shower seat;Toilet riser;(Pt reports RW is broken)          Prior Functioning/Environment Prior Level of Function : Independent/Modified Independent;Patient poor historian/Family not available             Mobility Comments: uses RW for longer distance, wears back brace ADLs Comments: Ind to Mod I with ADLs. Pt reports husband assists  with IALDs, including medication mangement and transportation.    OT Problem List: Decreased strength;Decreased activity tolerance;Impaired balance (sitting and/or standing);Decreased safety awareness;Decreased cognition;Decreased knowledge of use of DME or AE;Decreased knowledge of precautions;Pain   OT  Treatment/Interventions: Self-care/ADL training;Therapeutic exercise;Energy conservation;DME and/or AE instruction;Therapeutic activities;Cognitive remediation/compensation;Patient/family education;Balance training      OT Goals(Current goals can be found in the care plan section)   Acute Rehab OT Goals Patient Stated Goal: to return home with husband's assistance OT Goal Formulation: With patient Time For Goal Achievement: 11/09/23 Potential to Achieve Goals: Good ADL Goals Pt Will Perform Upper Body Bathing: with supervision;sitting;with adaptive equipment Pt Will Perform Lower Body Bathing: with supervision;sitting/lateral leans;sit to/from stand;with adaptive equipment Pt Will Perform Lower Body Dressing: with modified independence;sitting/lateral leans;sit to/from stand (with adaptive equipment as needed) Pt Will Perform Tub/Shower Transfer: Tub transfer;with supervision;ambulating;3 in 1 (with least restrictive AD)   OT Frequency:  Min 2X/week    Co-evaluation              AM-PAC OT 6 Clicks Daily Activity     Outcome Measure Help from another person eating meals?: None Help from another person taking care of personal grooming?: A Little Help from another person toileting, which includes using toliet, bedpan, or urinal?: A Little Help from another person bathing (including washing, rinsing, drying)?: A Little Help from another person to put on and taking off regular upper body clothing?: A Little Help from another person to put on and taking off regular lower body clothing?: A Little 6 Click Score: 19   End of Session Equipment Utilized During Treatment: Rolling  walker (2 wheels);Back brace Nurse Communication: Mobility status;Other (comment) (OT POC)  Activity Tolerance: Patient tolerated treatment well Patient left: in bed;with call bell/phone within reach  OT Visit Diagnosis: Unsteadiness on feet (R26.81);Muscle weakness (generalized) (M62.81);History of falling (Z91.81);Other symptoms and signs involving cognitive function;Pain                Time: 9062-9041 OT Time Calculation (min): 21 min Charges:  OT General Charges $OT Visit: 1 Visit OT Evaluation $OT Eval Moderate Complexity: 1 Mod  Margarie Rockey HERO., OTR/L, MA Acute Rehab [phone]   Margarie FORBES Horns 10/26/2023, 10:22 AM

## 2023-10-26 NOTE — Discharge Summary (Signed)
 Physician Discharge Summary  [name] FMW:[id number] DOB: [birth year] DOA: 10/24/2023  PCP: [name], PA-C  Admit date: 10/24/2023 Discharge date: 10/26/23  Admitted From: Home Disposition: Home Recommendations for Outpatient Follow-up:  Follow-up with neurosurgery on 11/06/2023 for laminectomy Check CMP and CBC at follow-up Patient is at risk for polypharmacy on multiple sedating medications.  encouraged to review her meds with prescribers Recheck thyroid  panel in 4 to 6 weeks.  Please follow up on the following pending results: None  Home Health: [facility] PT/OT Equipment/Devices: Rolling walker and bedside commode  Discharge Condition: Stable CODE STATUS: Full code  Follow-up Information     [name], PA-C. Schedule an appointment as soon as possible for a visit in 1 week(s).   Specialty: Physician Assistant Contact information: [address] [place] [address] [phone]         [facility] Follow up.   Why: Physical and Occupational therapy. Office will call to arrange start of care on Thursday. Contact information: [address] [place] [address] [phone]                 Hospital course 53 year old F with PMH of prior cardiac arrest in 2022 with anoxic brain injury, pulmonary hypertension, HFpEF, aortic regurgitation, HTN, HLD, hypothyroidism, Barrett's esophagus, chronic compression fracture of L4 and T12 with chronic back pain, mood disorder and osteoporosis brought to ED from pain medicine clinic due to altered mental status.  Per admitting provider, patient had perseverative speech and was not able to provide history on arrival.  Per anesthesia note,  She was very restless during blood draw attempts, and kept moving her arms, repositioning, and facial wincing/grimacing. She also has small veins. After another unsuccessful lab draw attempt I continued to talk with her and her husband, but felt  she was tiring out and dozing off, although still easily arousable to voice . Husband requested transport to ED for further evaluation. Discussed with husband over the phone, seemed at her normal before this clinic appointment. However did have nausea and vomiting. Husband not sure if she had maybe taken too many of her home medications.    In ED, slightly tachycardic with mildly elevated diastolic BP. Cr 1.4.  K3.0.  Serial troponin negative.  Lactic acid 2.9 and 1.8.SABRA  WBC 18.4. UA without significant finding.  Ammonia level normal.  CXR and CT head without acute finding.  UDS positive for amphetamines and cocaine.  COVID-19, influenza and RSV PCR nonreactive.  Patient was admitted for encephalopathy, AKI and SIRS.  Blood cultures obtained and started on broad-spectrum antibiotics as well.   The next day, encephalopathy resolved.  VBG basically normal.  CRP elevated to 5.5.  ESR 47.  Leukocytosis improved.  Thoracic and lumbar MRI ordered.   MRI thoracic and lumbar spine with subacute compression fracture of T12 with similar degree of height loss and retropulsion with some degree of myelomalacia, severe spinal canal stenosis as T11-12, acute fracture along the L2 inferior endplate without significant height loss and acute compression fracture with approximately 10% height loss without retropulsion, chronic compression deformities of L3 and L4 with interval height loss at L4 compared to 2024 and degenerative changes.   On the day of discharge, awake and alert and oriented x 4.  Single set of blood cultures NGTD.  Antibiotics discontinued.  She is discharged home with home health PT/OT, rolling walker and bedside commode.  Patient is scheduled for laminectomy  on 11/06/2023.  Extensive discussion about risk for polypharmacy with patient.   See individual problem list below for more.   Problems addressed during this hospitalization Acute toxic encephalopathy: Suspect this to be iatrogenic.  Patient is  at risk for polypharmacy on multiple sedating medications (Klonopin , oxycodone , Lyrica , Zanaflex  and trazodone ).  UDS positive for opiate and amphetamine.  Ammonia, UA, CXR and CT head are unrevealing.  VBG normal but obtained late.  TSH is slightly low.  She has some leukocytosis with mild lactic acidosis and tachycardia on presentation. Some question about possible infection/SIRS and started on broad-spectrum antibiotics.  Also question about dehydration and she was started on IV fluid.  Encephalopathy and AKI resolved.  Leukocytosis improved.  CRP elevated to 5.5 with ESR of 47.  MRI thoracic and lumbar spine as above.  Blood cultures NGTD.  Antibiotics discontinued.  Patient with significant risk for polypharmacy.  Discontinued promethazine , Klonopin  and baclofen .  She has been encouraged to review the rest of her medications with prescribers.       SIRS: Has tachycardia, leukocytosis with lactic acidosis on presentation.  Unclear source of infection.  She has back pain but in the setting of known compression fractures.  Lumbar film did not show anything acute other than osteopenia and compression fractures.  His CRP is elevated to 5.5 with ESR of 47.  MRI thoracic and lumbar spine as above.  Blood cultures NGTD.  She has good and symmetric strength and sensorium in all extremities.  She is scheduled for back surgery on 11/06/2023 with Dr. Joshua.   AKI: b/l creatinine 0.5.  Likely due to dehydration.  Resolved.   Choreiform movement?  restless in legs but able to control stopped.  She attributed this to pain.  Resolved   Chronic compression fracture L4, T12,  chronic back pain Possible L5 superior endplate fracture versus artifact -MRI lumbar and thoracic spine as above. -She is scheduled for back surgery on 7/16 with neurosurgery. -Pain control as above - HH PT/OT/RW and bedside commode   Polypharmacy: On multiple sedating medications (Klonopin , baclofen , promethazine , oxycodone , Lyrica , Zanaflex   and trazodone ).  Discontinued baclofen , promethazine  and Klonopin . -Encouraged to discuss the rest of his medications with prescribers outpatient  Hypokalemia/hypophosphatemia: Received p.o. KCl 60 mEq in house. -Discharged on K-Phos.   History of cardiac arrest '22 with anoxic brain injury: Noted Chronic diastolic CHF/PAH/AR: Stable.  Outpatient follow-up. Essential hypertension: Decreased Coreg  to 6.25 mg twice daily due to some low blood pressures. Hyperlipidemia: Not on cholesterol-lowering agents Hypothyroidism: TSH slightly low.  Free T4 slightly elevated.  Continue Synthroid .  Recheck thyroid  panel in 4 to 6 weeks Barrets esophagus: Continue pantoprazole  twice daily   Mood d/o: Continue home aripiprazole , duloxetine , prazosin .  Trazodone  reduced due to encephalopathy. There is no height or weight on file to calculate BMI.           Consultations: None  Time spent 35  minutes  Vital signs Vitals:   10/26/23 0302 10/26/23 0549 10/26/23 0903 10/26/23 0909  BP: 117/61 104/63 139/82   Pulse:  93    Temp:  98.4 F (36.9 C)  98.9 F (37.2 C)  Resp:      SpO2:  98%    TempSrc:  Oral Oral Oral     Discharge exam  GENERAL: No apparent distress.  Nontoxic. HEENT: MMM.  Vision and hearing grossly intact.  NECK: Supple.  No apparent JVD.  RESP:  No IWOB.  Fair aeration bilaterally. CVS:  RRR. Heart sounds  normal.  ABD/GI/GU: BS+. Abd soft, NTND.  MSK/EXT:  Moves extremities. No apparent deformity. No edema.  SKIN: no apparent skin lesion or wound NEURO: Awake and alert. Oriented x 4.  Motor 5/5 in all extremities.  Sensory intact.  No apparent focal neuro deficit. PSYCH: Calm. Normal affect.   Discharge Instructions Discharge Instructions     Diet - low sodium heart healthy   Complete by: As directed    Discharge instructions   Complete by: As directed    It has been a pleasure taking care of you!  You were hospitalized due to altered mental status likely from  multiple sedating medications.  Your symptoms resolved.  After the tests we have run, it is very unlikely that you have infection.  Note that combination of oxycodone , Lyrica , trazodone , Klonopin , baclofen  and promethazine  can increase your risk of drowsiness, sedation, constipation, impaired judgment, respiratory depression that could potentially lead to death and impaired balance that could increase risk of fall.  We have discontinued Klonopin , baclofen  and promethazine  per our discussion.  We strongly recommend reviewing the rest of your medications with your prescriber. We do not recommend driving, operating machinery or other activity that requires similar mental and physical engagement.  Please review your new medication list and the directions on your medications before you take them.  Follow-up with neurosurgery for compression fractures.    Take care,   Increase activity slowly   Complete by: As directed       Allergies as of 10/26/2023   No Known Allergies      Medication List     STOP taking these medications    Baclofen  5 MG Tabs   clonazePAM  0.5 MG tablet Commonly known as: KLONOPIN    promethazine  25 MG tablet Commonly known as: PHENERGAN        TAKE these medications    acetaminophen  650 MG CR tablet Commonly known as: TYLENOL  Take 1,300 mg by mouth every 8 (eight) hours as needed for pain.   ARIPiprazole  10 MG tablet Commonly known as: Abilify  Take 1 tablet (10 mg total) by mouth daily.   atorvastatin  20 MG tablet Commonly known as: LIPITOR Take 1 tablet (20 mg total) by mouth daily.   carvedilol  6.25 MG tablet Commonly known as: COREG  Take 1 tablet (6.25 mg total) by mouth 2 (two) times daily with a meal. What changed:  medication strength how much to take   DULoxetine  60 MG capsule Commonly known as: CYMBALTA  Take 1 capsule (60 mg total) by mouth 2 (two) times daily.   ibuprofen  200 MG tablet Commonly known as: ADVIL  Take 400 mg by mouth  daily as needed for moderate pain (pain score 4-6) or mild pain (pain score 1-3).   levothyroxine  50 MCG tablet Commonly known as: SYNTHROID  Take 50 mcg by mouth daily before breakfast.   lidocaine  5 % Commonly known as: LIDODERM  Place 1 patch onto the skin daily. Remove & Discard patch within 12 hours or as directed by MD What changed:  when to take this reasons to take this additional instructions   metoCLOPramide  5 MG tablet Commonly known as: REGLAN  Take 1 tablet (5 mg total) by mouth every 8 (eight) hours as needed for nausea or vomiting.   Mounjaro  7.5 MG/0.5ML Pen Generic drug: tirzepatide  Inject 7.5 mg into the skin once a week.   multivitamin Tabs tablet Take 1 tablet by mouth at bedtime.   ondansetron  8 MG disintegrating tablet Commonly known as: ZOFRAN -ODT Take 1 tablet (8 mg total)  by mouth daily as needed for nausea or vomiting.   oxyCODONE  15 MG immediate release tablet Commonly known as: ROXICODONE  Take 1 tablet (15 mg total) by mouth 4 (four) times daily as needed. What changed: when to take this   pantoprazole  40 MG tablet Commonly known as: PROTONIX  Take 1 tablet by mouth twice daily   phosphorus 155-852-130 MG tablet Commonly known as: K PHOS  NEUTRAL Take 2 tablets (500 mg total) by mouth 3 (three) times daily for 1 day.   prazosin  5 MG capsule Commonly known as: MINIPRESS  Take 1 capsule (5 mg total) by mouth at bedtime.   pregabalin  50 MG capsule Commonly known as: LYRICA  Take 50 mg by mouth 2 (two) times daily.   Prolia 60 MG/ML Sosy injection Generic drug: denosumab Inject 60 mg into the skin every 6 (six) months.   tiZANidine  4 MG tablet Commonly known as: ZANAFLEX  Take 1 tablet (4 mg total) by mouth every 6 (six) hours as needed. What changed: when to take this   trazodone  300 MG tablet Commonly known as: DESYREL  Take 2 tablets (600 mg total) by mouth at bedtime.   Vitamin D  (Ergocalciferol ) 1.25 MG (50000 UNIT) Caps  capsule Commonly known as: DRISDOL  Take 1 capsule (50,000 Units total) by mouth every 7 (seven) days. Start taking on: November 01, 2023               Durable Medical Equipment  (From admission, onward)           Start     Ordered   10/26/23 [phone]  For home use only DME Walker rolling  Once       Question Answer Comment  Walker: With 5 Inch Wheels   Patient needs a walker to treat with the following condition Generalized weakness      10/26/23 0834   10/26/23 0834  For home use only DME Bedside commode  Once       Question:  Patient needs a bedside commode to treat with the following condition  Answer:  Generalized weakness   10/26/23 0834             Procedures/Studies: None   MR Lumbar Spine W Wo Contrast Result Date: 10/25/2023 CLINICAL DATA:  Compression fracture thoracic spine. EXAM: MRI THORACIC AND LUMBAR SPINE WITHOUT AND WITH CONTRAST TECHNIQUE: Multiplanar and multiecho pulse sequences of the thoracic and lumbar spine were obtained without and with intravenous contrast. CONTRAST:  9mL GADAVIST  GADOBUTROL  1 MMOL/ML IV SOLN COMPARISON:  CT thoracic spine 09/11/2023. CT lumbar spine 07/27/2023. MRI lumbar spine 06/26/2022. FINDINGS: MRI THORACIC SPINE FINDINGS Alignment: Thoracic kyphosis maintained within the upper thoracic spine. There is straightening of the normal thoracic kyphosis within the mid and lower thoracic spine. No significant listhesis. Vertebrae: Similar appearance of compression fracture of T12 with up to 80% height loss centrally. Approximately 5-6 mm retropulsion at the superior endplate of T12 is similar to the thoracic CT on 09/11/2023. Edema throughout the T12 vertebral body. Additional compression deformity of T11 with mild height loss without significant edema, likely chronic. There are additional chronic deformities of the T1 through T4 superior endplates. Enhancement in the T12 vertebral body likely related to inflammatory changes from compression  fracture. No suspicious osseous lesions. Cord: Compression of the thoracic cord at T12 with increased T2/stir signal concerning for edema versus myelomalacia. The thoracic cord is otherwise normal in caliber and signal intensity. Paraspinal and other soft tissues: Paraspinal soft tissues are unremarkable. Disc levels: Retropulsion of fracture  fragments at the superior endplate of T12 along with disc bulge at the T11-12 level which contacts and deforms the ventral thoracic cord. The cord is displaced dorsally within the thecal sac. There is cord compression and signal abnormality extending from the mid T11 level to the level of T12-L1. No additional significant disc bulge or additional areas of high-grade spinal canal stenosis. Facet arthrosis at multiple levels. There is mild foraminal stenosis noted at T9-10 and T10-11. Facet arthrosis along with retropulsion contributing to foraminal stenosis bilaterally at T11-12 and T12-L1. MRI LUMBAR SPINE FINDINGS Segmentation:  Standard. Alignment: Lumbar lordosis is maintained. No significant listhesis. Vertebrae: Irregularity along the L2 inferior endplate with linear fracture deformity and edema within the inferior aspect of L2 with mild enhancement. There is minimal height loss of L2 and minimal retropulsion along the inferior endplate. Chronic deformity of L3 with mild height loss centrally. Additional chronic deformity of L4 with moderate height loss anteriorly which is increased since 2024. There is a deformity of the L5 superior endplate with fracture line and edema and enhancement suggestive of additional compression fracture with approximately 10% height loss without significant retropulsion. Vertebral body heights otherwise maintained. Modic type 2 degenerative endplate changes at L5-S1. No suspicious osseous lesions. Conus medullaris: Extends to the L2-3 level and appears normal. Paraspinal and other soft tissues: Mild atrophy of the paraspinal musculature.  Paraspinal soft tissues otherwise unremarkable. Disc levels: T12-L1: Small disc bulge. Mild facet arthrosis. No significant spinal canal stenosis. Facet arthrosis and retropulsion of fracture fragments at T12 which results in mild bilateral foraminal stenosis. L1-2: No significant disc bulge. No significant spinal canal stenosis or foraminal stenosis. Mild facet arthrosis. L2-3: Disc desiccation. Small disc bulge. Mild facet arthrosis. No significant spinal canal or foraminal stenosis. L3-4: Disc desiccation. Small disc bulge. Chronic retropulsion at the superior endplate of L4. There is mild lateral recess narrowing. Mild facet arthrosis. Mild spinal canal stenosis. Mild bilateral foraminal stenosis new from prior. L4-5: Disc desiccation. Small central disc protrusion. Mild facet arthrosis and thickening of the ligamentum flavum. No significant spinal canal stenosis. Mild bilateral foraminal stenosis. L5-S1: Disc desiccation and moderate disc height loss. Small central disc protrusion. Mild facet arthrosis. No significant spinal canal stenosis. There is mild foraminal stenosis on the left. IMPRESSION: Subacute compression fracture of T12 with similar degree of height loss and retropulsion compared to CT on 09/11/2023. Retropulsed fracture fragments contact and deform the ventral thoracic cord with cord compression and signal abnormality concerning for edema versus myelomalacia. Severe spinal canal stenosis noted at T11-12. Additional chronic compression deformities in the thoracic spine as above. Acute fracture along the L2 inferior endplate without significant height loss. Minimal retropulsion at the L2 inferior endplate. Additional acute compression fracture of L5 with approximately 10% height loss without retropulsion. Chronic compression deformities of L3 and L4 with interval height loss at L4 compared to 2024. Degenerative changes as above. Mild spinal canal stenosis at L3-4 is new from prior. No high-grade  foraminal stenosis. Electronically Signed   By: Donnice Mania M.D.   On: 10/25/2023 12:07   MR THORACIC SPINE W WO CONTRAST Result Date: 10/25/2023 CLINICAL DATA:  Compression fracture thoracic spine. EXAM: MRI THORACIC AND LUMBAR SPINE WITHOUT AND WITH CONTRAST TECHNIQUE: Multiplanar and multiecho pulse sequences of the thoracic and lumbar spine were obtained without and with intravenous contrast. CONTRAST:  9mL GADAVIST  GADOBUTROL  1 MMOL/ML IV SOLN COMPARISON:  CT thoracic spine 09/11/2023. CT lumbar spine 07/27/2023. MRI lumbar spine 06/26/2022. FINDINGS: MRI THORACIC SPINE  FINDINGS Alignment: Thoracic kyphosis maintained within the upper thoracic spine. There is straightening of the normal thoracic kyphosis within the mid and lower thoracic spine. No significant listhesis. Vertebrae: Similar appearance of compression fracture of T12 with up to 80% height loss centrally. Approximately 5-6 mm retropulsion at the superior endplate of T12 is similar to the thoracic CT on 09/11/2023. Edema throughout the T12 vertebral body. Additional compression deformity of T11 with mild height loss without significant edema, likely chronic. There are additional chronic deformities of the T1 through T4 superior endplates. Enhancement in the T12 vertebral body likely related to inflammatory changes from compression fracture. No suspicious osseous lesions. Cord: Compression of the thoracic cord at T12 with increased T2/stir signal concerning for edema versus myelomalacia. The thoracic cord is otherwise normal in caliber and signal intensity. Paraspinal and other soft tissues: Paraspinal soft tissues are unremarkable. Disc levels: Retropulsion of fracture fragments at the superior endplate of T12 along with disc bulge at the T11-12 level which contacts and deforms the ventral thoracic cord. The cord is displaced dorsally within the thecal sac. There is cord compression and signal abnormality extending from the mid T11 level to the  level of T12-L1. No additional significant disc bulge or additional areas of high-grade spinal canal stenosis. Facet arthrosis at multiple levels. There is mild foraminal stenosis noted at T9-10 and T10-11. Facet arthrosis along with retropulsion contributing to foraminal stenosis bilaterally at T11-12 and T12-L1. MRI LUMBAR SPINE FINDINGS Segmentation:  Standard. Alignment: Lumbar lordosis is maintained. No significant listhesis. Vertebrae: Irregularity along the L2 inferior endplate with linear fracture deformity and edema within the inferior aspect of L2 with mild enhancement. There is minimal height loss of L2 and minimal retropulsion along the inferior endplate. Chronic deformity of L3 with mild height loss centrally. Additional chronic deformity of L4 with moderate height loss anteriorly which is increased since 2024. There is a deformity of the L5 superior endplate with fracture line and edema and enhancement suggestive of additional compression fracture with approximately 10% height loss without significant retropulsion. Vertebral body heights otherwise maintained. Modic type 2 degenerative endplate changes at L5-S1. No suspicious osseous lesions. Conus medullaris: Extends to the L2-3 level and appears normal. Paraspinal and other soft tissues: Mild atrophy of the paraspinal musculature. Paraspinal soft tissues otherwise unremarkable. Disc levels: T12-L1: Small disc bulge. Mild facet arthrosis. No significant spinal canal stenosis. Facet arthrosis and retropulsion of fracture fragments at T12 which results in mild bilateral foraminal stenosis. L1-2: No significant disc bulge. No significant spinal canal stenosis or foraminal stenosis. Mild facet arthrosis. L2-3: Disc desiccation. Small disc bulge. Mild facet arthrosis. No significant spinal canal or foraminal stenosis. L3-4: Disc desiccation. Small disc bulge. Chronic retropulsion at the superior endplate of L4. There is mild lateral recess narrowing. Mild  facet arthrosis. Mild spinal canal stenosis. Mild bilateral foraminal stenosis new from prior. L4-5: Disc desiccation. Small central disc protrusion. Mild facet arthrosis and thickening of the ligamentum flavum. No significant spinal canal stenosis. Mild bilateral foraminal stenosis. L5-S1: Disc desiccation and moderate disc height loss. Small central disc protrusion. Mild facet arthrosis. No significant spinal canal stenosis. There is mild foraminal stenosis on the left. IMPRESSION: Subacute compression fracture of T12 with similar degree of height loss and retropulsion compared to CT on 09/11/2023. Retropulsed fracture fragments contact and deform the ventral thoracic cord with cord compression and signal abnormality concerning for edema versus myelomalacia. Severe spinal canal stenosis noted at T11-12. Additional chronic compression deformities in the thoracic spine as  above. Acute fracture along the L2 inferior endplate without significant height loss. Minimal retropulsion at the L2 inferior endplate. Additional acute compression fracture of L5 with approximately 10% height loss without retropulsion. Chronic compression deformities of L3 and L4 with interval height loss at L4 compared to 2024. Degenerative changes as above. Mild spinal canal stenosis at L3-4 is new from prior. No high-grade foraminal stenosis. Electronically Signed   By: Donnice Mania M.D.   On: 10/25/2023 12:07   CT Head Wo Contrast Result Date: 10/24/2023 CLINICAL DATA:  Altered mental status. EXAM: CT HEAD WITHOUT CONTRAST TECHNIQUE: Contiguous axial images were obtained from the base of the skull through the vertex without intravenous contrast. RADIATION DOSE REDUCTION: This exam was performed according to the departmental dose-optimization program which includes automated exposure control, adjustment of the mA and/or kV according to patient size and/or use of iterative reconstruction technique. COMPARISON:  CT head 06/25/2022. FINDINGS:  Brain: No acute intracranial hemorrhage. No CT evidence of acute infarct. No edema, mass effect, or midline shift. The basilar cisterns are patent. There is mild asymmetric prominence of the left sylvian fissure suggestive of mild frontotemporal volume loss similar to prior. Ventricles: The ventricles are normal. Vascular: No hyperdense vessel or unexpected calcification. Skull: No acute or aggressive finding. Orbits: Orbits are symmetric. Sinuses: The visualized paranasal sinuses are clear. Other: Mastoid air cells are clear. IMPRESSION: No CT evidence of acute intracranial abnormality. Electronically Signed   By: Donnice Mania M.D.   On: 10/24/2023 16:47   DG Lumbar Spine Complete Result Date: 10/24/2023 CLINICAL DATA:  back pain EXAM: LUMBAR SPINE - COMPLETE 4+ VIEW COMPARISON:  Sep 11, 2023, July 27, 2023 FINDINGS: Diffuse osteopenia. Five non rib-bearing lumbar type vertebral bodies. Straightening of the normal lumbar lordosis without spondylolisthesis. Similar appearance of the burst fracture involving T12. Unchanged chronic compression fracture of L4 with superior endplate concavity along L3. Subtle endplate concavity along the superior endplate of L5 with sclerosis. Moderate intervertebral disc height loss at L5-S1. no pars interarticularis defects. The soft tissues are otherwise unremarkable. IMPRESSION: 1. Diffuse osteopenia. Subtle endplate concavity along the superior endplate of L5 with underlying sclerosis. This could reflect imaging artifact versus a subtle acute compression fracture. Correlation with point tenderness recommended. 2. Similar appearance of the burst fracture of T12. Electronically Signed   By: Rogelia Myers M.D.   On: 10/24/2023 16:26   DG Chest 1 View Result Date: 10/24/2023 CLINICAL DATA:  back pain EXAM: CHEST  1 VIEW COMPARISON:  October 08, 2023 FINDINGS: Overlying metallic structure, possibly a external brace. Elevation of the right hemidiaphragm. Streaky atelectasis again  noted in the right lung base. No new airspace consolidation, pleural effusion, or pneumothorax. No cardiomegaly.Partially visualized compression fracture in the lower thoracic spine. IMPRESSION: No acute cardiopulmonary abnormality. Electronically Signed   By: Rogelia Myers M.D.   On: 10/24/2023 16:22   DG Chest Port 1 View Result Date: 10/08/2023 CLINICAL DATA:  Acute respiratory failure EXAM: PORTABLE CHEST 1 VIEW COMPARISON:  Chest radiograph dated 07/27/2022 FINDINGS: Asymmetric elevation of the right hemidiaphragm. Normal lung volumes. Hazy right lower lung and patchy left basilar opacity. No pleural effusion or pneumothorax. Similar mildly enlarged cardiomediastinal silhouette. No acute osseous abnormality. IMPRESSION: Hazy right lower lung and patchy left basilar opacity, which may represent atelectasis, aspiration, or pneumonia. Electronically Signed   By: Limin  Xu M.D.   On: 10/08/2023 13:44       The results of significant diagnostics from this hospitalization (including imaging, microbiology,  ancillary and laboratory) are listed below for reference.     Microbiology: Recent Results (from the past 240 hours)  Surgical pcr screen     Status: None   Collection Time: 10/24/23 11:10 AM   Specimen: Nasal Mucosa; Nasal Swab  Result Value Ref Range Status   MRSA, PCR NEGATIVE NEGATIVE Final   Staphylococcus aureus NEGATIVE NEGATIVE Final    Comment: (NOTE) The Xpert SA Assay (FDA approved for NASAL specimens in patients 23 years of age and older), is one component of a comprehensive surveillance program. It is not intended to diagnose infection nor to guide or monitor treatment. Performed at [facility], [address], [place], [place] [address]   Resp panel by RT-PCR (RSV, Flu A&B, Covid) Anterior Nasal Swab     Status: None   Collection Time: 10/25/23 12:10 AM   Specimen: Anterior Nasal Swab  Result Value Ref Range Status   SARS Coronavirus 2 by RT PCR NEGATIVE  NEGATIVE Final   Influenza A by PCR NEGATIVE NEGATIVE Final   Influenza B by PCR NEGATIVE NEGATIVE Final    Comment: (NOTE) The Xpert Xpress SARS-CoV-2/FLU/RSV plus assay is intended as an aid in the diagnosis of influenza from Nasopharyngeal swab specimens and should not be used as a sole basis for treatment. Nasal washings and aspirates are unacceptable for Xpert Xpress SARS-CoV-2/FLU/RSV testing.  Fact Sheet for Patients: BloggerCourse.com  Fact Sheet for Healthcare Providers: SeriousBroker.it  This test is not yet approved or cleared by the [place]  FDA and has been authorized for detection and/or diagnosis of SARS-CoV-2 by FDA under an Emergency Use Authorization (EUA). This EUA will remain in effect (meaning this test can be used) for the duration of the COVID-19 declaration under Section 564(b)(1) of the Act, 21 U.S.C. section 360bbb-3(b)(1), unless the authorization is terminated or revoked.     Resp Syncytial Virus by PCR NEGATIVE NEGATIVE Final    Comment: (NOTE) Fact Sheet for Patients: BloggerCourse.com  Fact Sheet for Healthcare Providers: SeriousBroker.it  This test is not yet approved or cleared by the [place]  FDA and has been authorized for detection and/or diagnosis of SARS-CoV-2 by FDA under an Emergency Use Authorization (EUA). This EUA will remain in effect (meaning this test can be used) for the duration of the COVID-19 declaration under Section 564(b)(1) of the Act, 21 U.S.C. section 360bbb-3(b)(1), unless the authorization is terminated or revoked.  Performed at [facility], [address] [place], [place] [address]   Culture, blood (single)     Status: None (Preliminary result)   Collection Time: 10/25/23  6:49 AM   Specimen: BLOOD RIGHT HAND  Result Value Ref Range Status   Specimen Description BLOOD RIGHT HAND  Final   Special  Requests   Final    BOTTLES DRAWN AEROBIC AND ANAEROBIC Blood Culture adequate volume   Culture   Final    NO GROWTH 1 DAY Performed at [facility], [address], [place], [place] [address]    Report Status PENDING  Incomplete     Labs:  CBC: Recent Labs  Lab 10/24/23 1644 10/24/23 1925 10/25/23 0702 10/26/23 0433  WBC  --  18.4* 14.8* 9.6  NEUTROABS  --  14.9*  --   --   HGB 17.0* 13.2 11.1* 10.1*  HCT 50.0* 42.3 34.9* 31.2*  MCV  --  86.7 86.8 85.0  PLT  --  338 260 250   BMP &GFR Recent Labs  Lab 10/24/23 1644  10/24/23 1702 10/25/23 0702 10/26/23 0433  NA 144 142 141 138  K 3.6 3.0* 4.0 3.2*  CL 103 104 107 103  CO2  --  23 22 27   GLUCOSE 169* 150* 121* 112*  BUN 7 7 5* 5*  CREATININE 1.40* 1.38* 0.63 0.62  CALCIUM   --  9.1 8.1* 7.4*  MG  --   --  1.6* 2.1  PHOS  --   --  2.4* 1.8*   Estimated Creatinine Clearance: 88.4 mL/min (by C-G formula based on SCr of 0.62 mg/dL). Liver & Pancreas: Recent Labs  Lab 10/24/23 1701 10/26/23 0433  AST 24  --   ALT 13  --   ALKPHOS 114  --   BILITOT 0.8  --   PROT 8.6*  --   ALBUMIN  3.6 2.5*   Recent Labs  Lab 10/24/23 1701  LIPASE 19   Recent Labs  Lab 10/24/23 1701  AMMONIA 24   Diabetic: Recent Labs    10/24/23 1609  HGBA1C 5.8*   Recent Labs  Lab 10/24/23 1016  GLUCAP 188*   Cardiac Enzymes: No results for input(s): CKTOTAL, CKMB, CKMBINDEX, TROPONINI in the last 168 hours. No results for input(s): PROBNP in the last 8760 hours. Coagulation Profile: No results for input(s): INR, PROTIME in the last 168 hours. Thyroid  Function Tests: Recent Labs    10/25/23 0646 10/25/23 0702  TSH  --  0.347*  FREET4 1.20*  --    Lipid Profile: No results for input(s): CHOL, HDL, LDLCALC, TRIG, CHOLHDL, LDLDIRECT in the last 72 hours. Anemia Panel: Recent Labs    10/25/23 0702  VITAMINB12 531   Urine analysis:    Component Value Date/Time   COLORURINE AMBER  (A) 10/24/2023 1741   APPEARANCEUR CLOUDY (A) 10/24/2023 1741   LABSPEC 1.027 10/24/2023 1741   PHURINE 5.0 10/24/2023 1741   GLUCOSEU NEGATIVE 10/24/2023 1741   HGBUR NEGATIVE 10/24/2023 1741   BILIRUBINUR MODERATE (A) 10/24/2023 1741   KETONESUR 5 (A) 10/24/2023 1741   PROTEINUR 100 (A) 10/24/2023 1741   UROBILINOGEN 0.2 01/19/2015 0302   NITRITE NEGATIVE 10/24/2023 1741   LEUKOCYTESUR NEGATIVE 10/24/2023 1741   Sepsis Labs: Invalid input(s): PROCALCITONIN, LACTICIDVEN   SIGNED:  Osias Resnick T Aynslee Mulhall, MD  [facility] 10/26/2023, 3:16 PM

## 2023-10-26 NOTE — Progress Notes (Signed)
 PT Cancellation Note  Patient Details Name: [name] MRN: [id number] DOB: [birth year]   Cancelled Treatment:    Reason Eval/Treat Not Completed: Patient declined, no reason specified (Pt politely refused PT treatment. Reporting she had worked with OT earlier this morning, was getting ready to be bathed by nursing, and is set to d/c today. Pt declined PT coming back.) Pt/Family feel ready and safe for discharge. They had not questions related to mobility.   Randall SAUNDERS, PT, DPT Acute Rehabilitation Services Office: [phone] Secure Chat Preferred  Delon CHRISTELLA Callander 10/26/2023, 11:52 AM

## 2023-10-26 NOTE — Progress Notes (Signed)
 Patient blood pressure reported to Lavanda Horns, NP and discussed with the medication not to be given.

## 2023-10-26 NOTE — Plan of Care (Signed)

## 2023-10-28 ENCOUNTER — Other Ambulatory Visit (HOSPITAL_COMMUNITY): Payer: Self-pay

## 2023-10-28 ENCOUNTER — Other Ambulatory Visit: Payer: Self-pay

## 2023-10-28 ENCOUNTER — Encounter: Payer: Self-pay | Admitting: Internal Medicine

## 2023-10-28 ENCOUNTER — Encounter (HOSPITAL_COMMUNITY): Payer: Self-pay

## 2023-10-28 ENCOUNTER — Telehealth: Payer: Self-pay

## 2023-10-28 ENCOUNTER — Encounter: Payer: Self-pay | Admitting: *Deleted

## 2023-10-28 MED ORDER — ONDANSETRON 8 MG PO TBDP
8.0000 mg | ORAL_TABLET | Freq: Three times a day (TID) | ORAL | 3 refills | Status: DC
  Filled 2023-10-28 (×2): qty 45, 15d supply, fill #0

## 2023-10-28 MED ORDER — PREGABALIN 50 MG PO CAPS
50.0000 mg | ORAL_CAPSULE | Freq: Two times a day (BID) | ORAL | 1 refills | Status: DC
  Filled 2023-10-28: qty 60, 30d supply, fill #0

## 2023-10-28 MED ORDER — RENA-VITE PO TABS
1.0000 | ORAL_TABLET | Freq: Every day | ORAL | 1 refills | Status: DC
  Filled 2023-10-28: qty 100, 100d supply, fill #0

## 2023-10-28 NOTE — Telephone Encounter (Signed)
   Name: [name]  DOB: 1970-12-13  MRN: [id number]  Primary Cardiologist: Dorn Lesches, MD  Chart reviewed as part of pre-operative protocol coverage. Because of [name]'s past medical history and time since last visit, she will require a follow-up in-office visit in order to better assess preoperative cardiovascular risk.  Patient has not been seen in office since 2023.  Pre-op covering staff: - Please schedule appointment and call patient to inform them. If patient already had an upcoming appointment within acceptable timeframe, please add pre-op clearance to the appointment notes so provider is aware. - Please contact requesting surgeon's office via preferred method (i.e, phone, fax) to inform them of need for appointment prior to surgery.   Damien JAYSON Braver, NP  10/28/2023, 10:55 AM

## 2023-10-28 NOTE — Telephone Encounter (Signed)
 Pt scheduled for OV on 10/29/23.

## 2023-10-28 NOTE — Telephone Encounter (Signed)
   Pre-operative Risk Assessment    Patient Name: [name]  DOB: [birth year] MRN: [id number]   Date of last office visit: 08/09/21 DR COURT Date of next office visit: NONE   Request for Surgical Clearance    Procedure:  T10-L2 POSTERIOR LATERAL FUSION  Date of Surgery:  Clearance 11/06/23                                Surgeon:  ALM GORMAN MOLT Surgeon's Group or Practice Name:  [facility]  Phone number:  [phone] Fax number:  [phone]   Type of Clearance Requested:   - Medical    Type of Anesthesia:  General    Additional requests/questions:    Signed, Francile Woolford   10/28/2023, 10:10 AM

## 2023-10-29 ENCOUNTER — Other Ambulatory Visit (HOSPITAL_COMMUNITY): Payer: Self-pay

## 2023-10-29 ENCOUNTER — Ambulatory Visit: Attending: Physician Assistant | Admitting: Physician Assistant

## 2023-10-29 ENCOUNTER — Encounter: Payer: Self-pay | Admitting: Physician Assistant

## 2023-10-29 VITALS — BP 114/60 | HR 100 | Ht 64.5 in | Wt 202.4 lb

## 2023-10-29 DIAGNOSIS — I272 Pulmonary hypertension, unspecified: Secondary | ICD-10-CM

## 2023-10-29 DIAGNOSIS — E1169 Type 2 diabetes mellitus with other specified complication: Secondary | ICD-10-CM

## 2023-10-29 DIAGNOSIS — Z01810 Encounter for preprocedural cardiovascular examination: Secondary | ICD-10-CM

## 2023-10-29 DIAGNOSIS — I38 Endocarditis, valve unspecified: Secondary | ICD-10-CM

## 2023-10-29 DIAGNOSIS — I10 Essential (primary) hypertension: Secondary | ICD-10-CM

## 2023-10-29 DIAGNOSIS — I4711 Inappropriate sinus tachycardia, so stated: Secondary | ICD-10-CM

## 2023-10-29 DIAGNOSIS — E782 Mixed hyperlipidemia: Secondary | ICD-10-CM

## 2023-10-29 NOTE — Assessment & Plan Note (Signed)
 HR is well controlled today on current Rx which included Carvedilol  6.25 mg twice daily. Continue current Rx. At this point, I do not think she needs a follow up cardiac monitor.

## 2023-10-29 NOTE — Assessment & Plan Note (Signed)
The patient's blood pressure is controlled on her current regimen.  Continue current therapy.   

## 2023-10-29 NOTE — Progress Notes (Addendum)
 "     OFFICE NOTE:    Date:  10/29/2023  ID:  [name], DOB 1971-02-08, MRN [id number] PCP: Dia Lamarr FORBES DEVONNA  [facility] Providers Cardiologist:  Dorn Lesches, MD        Hx of cardiac arrest 05/2020  Felt to be 2/2 opiate/amphetamine overdose c/b ARF due to ATN (req'd CRRT), resp failure, sepsis, encephalopathy Mitral regurgitation Aortic insufficiency  TTE 06/30/22: EF 55-60, no RWMA, Gr 1 DD, NL RVSF, severe pulmonary HTN, RVSP 64.5, mod MR, mod TR, mild to mod AI Inappropriate sinus tachycardia beta-blocker started during hospitalization in 06/2022 Follow up monitor never completed  Reported hx of congestive heart failure (Patient notes dx years ago @ [facility]) Hypertension  Hyperlipidemia  Diabetes mellitus  Aortic atherosclerosis Depression, anxiety PTSD Chronic pain Hypothyroidism Cyclical vomiting syndrome        Discussed the use of AI scribe software for clinical note transcription with the patient, who gave verbal consent to proceed. History of Present Illness [name] is a 53 y.o. female who returns for surgical clearance. Pt was last seen by Dr. Lesches in 07/2022.  She has a history of cardiac arrest in February 2022.  She underwent CPR with ROSC.  At that time, she was evaluated by interventional cardiology for potential STEMI.  However, the presentation was not felt to be consistent with STEMI and she had no wall motion abnormalities on echocardiogram.  Review of the discharge notes indicates that it was felt that her cardiac arrest was felt to be due to opiate and amphetamine overdose and her hospitalization was complicated by acute renal failure requiring CRRT, respiratory failure, sepsis, encephalopathy.  She has had multiple admissions and ED visits, mostly related to cyclical vomiting syndrome. She had an admission in 06/2022 with urosepsis and compression fx related to a fall. TTE during that admission showed normal EF, severe  pulmonary hypertension with RVSP 64.5, mod MR, mod TR, mild to mod AI. She was started on beta-blocker Rx for inappropriate sinus tachycardia. During another admission in 06/2022 for cyclical vomiting, she was bradycardic. Her beta-blocker was reduced and a Zio monitor was ordered. This was never completed. She has not seen Cardiology since. She needs multilevel laminectomy, spinal fusion for T12 compression fx with Dr. Joshua 11/06/2023 under general anesthesia. She was seen for pre-anesthesia eval 10/24/23 and was taken to the ED due to altered mental status. She was admitted 7/3-7/5 and managed for acute toxic encephalopathy felt to be due to polypharmacy. She takes multiple sedating medications. UDS was + for opiates, amphetamine.    She is here with her husband. She has no current chest pain, pressure, heaviness, or shortness of breath. She denies any history of heart attack or stroke. She has diabetes, managed with Mounjaro , and hypertension. She uses a walker and a wheelchair for mobility due to her back problems and is unable to walk long distances or perform activities like carrying groceries or vacuuming. Her family history includes a grandmother on her father's side who had a heart attack. She denies smoking, drug use, or alcohol  consumption.    ROS-See HPI     Studies Reviewed:  EKG Interpretation Date/Time:  Tuesday October 29 2023 14:13:26 EDT Ventricular Rate:  100 PR Interval:  132 QRS Duration:  76 QT Interval:  364 QTC Calculation: 469 R Axis:   -6  Text Interpretation: Normal sinus rhythm Minimal voltage criteria for LVH, may be normal variant No significant change since last tracing  Confirmed by Lelon Hamilton [phone]) on 10/29/2023 2:24:03 PM   Labs reviewed 10/24/23: hsTrop 8 >> 5; UDS + amphetamines, opiates 10/25/23: TSH 0.347 ?, T4 1.20 ?  10/26/23: K 3.2, SCr 0.62, Alb 2.5, Hgb 10.1, Plt 250 K          Physical Exam:  VS:  BP 114/60   Pulse 100   Ht 5' 4.5 (1.638 m)   Wt 202 lb  6.4 oz (91.8 kg)   LMP 06/03/2018 (Approximate) Comment: neg hcg 05/10/20  SpO2 96%   BMI 34.21 kg/m        Wt Readings from Last 3 Encounters:  10/29/23 202 lb 6.4 oz (91.8 kg)  10/24/23 194 lb 9.6 oz (88.3 kg)  10/08/23 207 lb 3.7 oz (94 kg)    Constitutional:      Appearance: Healthy appearance. Not in distress.  Neck:     Vascular: No JVR.  Pulmonary:     Breath sounds: Normal breath sounds. No wheezing. No rales.  Cardiovascular:     Normal rate. Regular rhythm.     Murmurs: There is no murmur.  Edema:    Peripheral edema absent.  Abdominal:     Palpations: Abdomen is soft.       Assessment and Plan:    Assessment & Plan Preoperative cardiovascular examination Ms. Gilpatrick's perioperative risk of a major cardiac event is 0.9% according to the Revised Cardiac Risk Index (RCRI).  Therefore, she is at low risk for perioperative complications.   Her functional capacity is poor at 3.3 METs according to the Duke Activity Status Index (DASI). Per ACC guidelines, she does not need ischemic evaluation. I reviewed her case with Dr. Court. We both agree that she does not need ischemic evaluation. She does need an echocardiogram to follow up on valvular heart disease.  Recommendations: The patient requires an echocardiogram before a disposition can be made regarding surgical risk.                Inappropriate sinus tachycardia (HCC) HR is well controlled today on current Rx which included Carvedilol  6.25 mg twice daily. Continue current Rx. At this point, I do not think she needs a follow up cardiac monitor.  Essential hypertension The patient's blood pressure is controlled on her current regimen.  Continue current therapy.   Mixed diabetic hyperlipidemia associated with type 2 diabetes mellitus (HCC) Continue Atorvastatin  20 mg once daily.  Valvular heart disease TTE in 06/2022 during acute illness demonstrated mod MR, mod TR, mild to mod AI. She is not having any symptoms to suggest  worsening valvular heart disease. I cannot appreciate a murmur on exam. Given valvular heart disease of a moderate degree, per guidelines, she needs a follow up echocardiogram prior to clearing her for surgery. -Arrange 2D echocardiogram  Pulmonary hypertension, unspecified (HCC) Severe pulmonary hypertension on TTE in 06/2022. RVSP was 64.5 at that time. TTE was done during admission for acute illness. She notes a remote hx of congestive heart failure but no active signs or symptoms. She denies a hx of pulmonary disease. Will need to obtain repeat Echocardiogram to reassess pulmonary pressures. -Arrange 2 D Echocardiogram     ADDENDUM: Echocardiogram done 10/31/2023 showed normal ejection fraction, mild to moderate mitral regurgitation and mild aortic insufficiency. The RVSP is now normal. The patient can proceed with surgery at acceptable CV risk.     Dispo:  Return in about 6 months (around 04/30/2024) for Routine Follow Up w/ Dr. Court.  Signed, Hamilton Lelon,  PA-C   "

## 2023-10-29 NOTE — Patient Instructions (Signed)
 Medication Instructions:  Your physician recommends that you continue on your current medications as directed. Please refer to the Current Medication list given to you today.  *If you need a refill on your cardiac medications before your next appointment, please call your pharmacy*  Lab Work: None ordered  If you have labs (blood work) drawn today and your tests are completely normal, you will receive your results only by: MyChart Message (if you have MyChart) OR A paper copy in the mail If you have any lab test that is abnormal or we need to change your treatment, we will call you to review the results.  Testing/Procedures: Your physician has requested that you have an echocardiogram THIS THURSDAY, 10/31/23, ARRIVE AT 8:50. Echocardiography is a painless test that uses sound waves to create images of your heart. It provides your doctor with information about the size and shape of your heart and how well your heart's chambers and valves are working. This procedure takes approximately one hour. There are no restrictions for this procedure. Please do NOT wear cologne, perfume, aftershave, or lotions (deodorant is allowed). Please arrive 15 minutes prior to your appointment time.  Please note: We ask at that you not bring children with you during ultrasound (echo/ vascular) testing. Due to room size and safety concerns, children are not allowed in the ultrasound rooms during exams. Our front office staff cannot provide observation of children in our lobby area while testing is being conducted. An adult accompanying a patient to their appointment will only be allowed in the ultrasound room at the discretion of the ultrasound technician under special circumstances. We apologize for any inconvenience.   Follow-Up: At [facility], you and your health needs are our priority.  As part of our continuing mission to provide you with exceptional heart care, our providers are all part of one team.   This team includes your primary Cardiologist (physician) and Advanced Practice Providers or APPs (Physician Assistants and Nurse Practitioners) who all work together to provide you with the care you need, when you need it.  Your next appointment:   6 month(s)  Provider:   Dorn Lesches, MD    We recommend signing up for the patient portal called MyChart.  Sign up information is provided on this After Visit Summary.  MyChart is used to connect with patients for Virtual Visits (Telemedicine).  Patients are able to view lab/test results, encounter notes, upcoming appointments, etc.  Non-urgent messages can be sent to your provider as well.   To learn more about what you can do with MyChart, go to [email or web address].   Other Instructions

## 2023-10-29 NOTE — Assessment & Plan Note (Signed)
Continue Atorvastatin 20 mg once daily.

## 2023-10-30 LAB — CULTURE, BLOOD (SINGLE)
Culture: NO GROWTH
Special Requests: ADEQUATE

## 2023-10-31 ENCOUNTER — Other Ambulatory Visit (HOSPITAL_COMMUNITY): Payer: Self-pay

## 2023-10-31 ENCOUNTER — Ambulatory Visit: Payer: Self-pay | Admitting: Physician Assistant

## 2023-10-31 ENCOUNTER — Ambulatory Visit (HOSPITAL_COMMUNITY)
Admission: RE | Admit: 2023-10-31 | Discharge: 2023-10-31 | Disposition: A | Discharge status: Home / Self Care | Source: Ambulatory Visit | Attending: Cardiology | Admitting: Cardiology

## 2023-10-31 ENCOUNTER — Other Ambulatory Visit: Payer: Self-pay | Admitting: Physician Assistant

## 2023-10-31 DIAGNOSIS — E1169 Type 2 diabetes mellitus with other specified complication: Secondary | ICD-10-CM | POA: Diagnosis present

## 2023-10-31 DIAGNOSIS — I38 Endocarditis, valve unspecified: Secondary | ICD-10-CM

## 2023-10-31 DIAGNOSIS — I4711 Inappropriate sinus tachycardia, so stated: Secondary | ICD-10-CM | POA: Insufficient documentation

## 2023-10-31 DIAGNOSIS — E782 Mixed hyperlipidemia: Secondary | ICD-10-CM | POA: Diagnosis present

## 2023-10-31 DIAGNOSIS — Z01810 Encounter for preprocedural cardiovascular examination: Secondary | ICD-10-CM | POA: Diagnosis present

## 2023-10-31 DIAGNOSIS — I340 Nonrheumatic mitral (valve) insufficiency: Secondary | ICD-10-CM

## 2023-10-31 DIAGNOSIS — I10 Essential (primary) hypertension: Secondary | ICD-10-CM | POA: Insufficient documentation

## 2023-10-31 LAB — ECHOCARDIOGRAM COMPLETE
AR max vel: 2.65 cm2
AV Peak grad: 6 mmHg
Ao pk vel: 1.22 m/s
Area-P 1/2: 3.74 cm2
MV M vel: 5.03 m/s
MV Peak grad: 101.2 mmHg
MV VTI: 1.66 cm2
S' Lateral: 2.8 cm

## 2023-10-31 MED ORDER — OXYCODONE HCL 15 MG PO TABS
15.0000 mg | ORAL_TABLET | Freq: Four times a day (QID) | ORAL | 0 refills | Status: DC | PRN
  Filled 2023-11-01: qty 120, 30d supply, fill #0

## 2023-10-31 NOTE — Anesthesia Preprocedure Evaluation (Addendum)
 Anesthesia Evaluation  Patient identified by MRN, date of birth, ID band Patient awake    Reviewed: Allergy  & Precautions, NPO status , Patient's Chart, lab work & pertinent test results  History of Anesthesia Complications Negative for: history of anesthetic complications  Airway Mallampati: III  TM Distance: >3 FB Neck ROM: Full    Dental  (+) Lower Dentures, Upper Dentures   Pulmonary neg pulmonary ROS, neg sleep apnea, neg COPD, Patient abstained from smoking.Not current smoker   Pulmonary exam normal        Cardiovascular Exercise Tolerance: Poor METShypertension, + CAD  (-) Past MI (-) dysrhythmias + Valvular Problems/Murmurs  Rhythm:Regular Rate:Normal + Systolic murmurs She has a history of cardiac arrest in February 2022.  She underwent CPR with ROSC.  At that time, she was evaluated by interventional cardiology for potential STEMI.  However, the presentation was not felt to be consistent with STEMI and she had no wall motion abnormalities on echocardiogram.  Review of the discharge notes indicates that it was felt that her cardiac arrest was felt to be due to opiate and amphetamine overdose and her hospitalization was complicated by acute renal failure requiring CRRT, respiratory failure, sepsis, encephalopathy.  Recent ED visit during pre-admission testing appoitnment for altered mental status, attributed to polypharmacy.  Echo 10/31/23: IMPRESSIONS  1. Left ventricular ejection fraction, by estimation, is 60 to 65%. The  left ventricle has normal function. The left ventricle has no regional  wall motion abnormalities. Left ventricular diastolic parameters were  normal.  2. Right ventricular systolic function is normal. The right ventricular  size is normal.  3. The mitral valve is normal in structure. Mild to moderate mitral valve  regurgitation. No evidence of mitral stenosis.   4. The tricuspid valve is normal in  structure.  Tricuspid valve regurgitation is mild. RVSP 34.6 mmHg. 5. The aortic valve is normal in structure. Aortic valve regurgitation is  mild. No aortic stenosis is present.  6. The inferior vena cava is normal in size with greater than 50%  respiratory variability, suggesting right atrial pressure of 3 mmHg.     Neuro/Psych  Headaches PSYCHIATRIC DISORDERS Anxiety Depression Bipolar Disorder   On chronic opioids  Neuromuscular disease    GI/Hepatic ,GERD  ,,(+)     (-) substance abuse    Endo/Other  diabetes  GLP1 last taken over a week ago, denies GI symptoms today  Renal/GU negative Renal ROS     Musculoskeletal  (+) Arthritis , Osteoarthritis,    Abdominal   Peds  Hematology   Anesthesia Other Findings Past Medical History: No date: Anxiety No date: Arthritis     Comment:  joints ache all over (10/15/2014) No date: Barrett's esophagus No date: Bulging lumbar disc No date: Cardiac arrest (HCC) No date: Chronic lower back pain No date: Coronary artery disease No date: DDD (degenerative disc disease), cervical No date: Depression No date: DM (diabetes mellitus) (HCC) No date: Drug-seeking behavior No date: GERD (gastroesophageal reflux disease) No date: Headache     Comment:  weekly (10/15/2014) No date: Hyperlipemia No date: Hypertension No date: Hypothyroidism No date: PTSD (post-traumatic stress disorder) No date: Skin cancer     Comment:  had them cut off my arms; don't know what kind  Reproductive/Obstetrics                              Anesthesia Physical Anesthesia Plan  ASA: 3  Anesthesia Plan:  General   Post-op Pain Management: Tylenol  PO (pre-op)*, Gabapentin  PO (pre-op)*, Ketamine  IV* and Dilaudid  IV   Induction: Intravenous  PONV Risk Score and Plan: 4 or greater and Ondansetron , Dexamethasone  and Midazolam   Airway Management Planned: Oral ETT  Additional Equipment: Arterial line  Intra-op Plan:    Post-operative Plan: Extubation in OR  Informed Consent: I have reviewed the patients History and Physical, chart, labs and discussed the procedure including the risks, benefits and alternatives for the proposed anesthesia with the patient or authorized representative who has indicated his/her understanding and acceptance.     Dental advisory given  Plan Discussed with: CRNA and Surgeon  Anesthesia Plan Comments: (Discussed risks of anesthesia with patient, including PONV, sore throat, lip/dental/eye damage. Rare risks discussed as well, such as cardiorespiratory and neurological sequelae, and allergic reactions. Discussed the role of CRNA in patient's perioperative care. Patient understands. Discussed realistic post op pain expectations. Discussed arterial line placement.)         Anesthesia Quick Evaluation

## 2023-10-31 NOTE — Telephone Encounter (Signed)
 Notes faxed to surgeon. Glendia Ferrier, PA-C    10/31/2023 12:32 PM

## 2023-11-01 ENCOUNTER — Other Ambulatory Visit (HOSPITAL_COMMUNITY): Payer: Self-pay

## 2023-11-01 ENCOUNTER — Other Ambulatory Visit: Payer: Self-pay

## 2023-11-04 ENCOUNTER — Other Ambulatory Visit (HOSPITAL_COMMUNITY): Payer: Self-pay

## 2023-11-06 ENCOUNTER — Inpatient Hospital Stay (HOSPITAL_COMMUNITY)

## 2023-11-06 ENCOUNTER — Inpatient Hospital Stay (HOSPITAL_COMMUNITY): Payer: Self-pay

## 2023-11-06 ENCOUNTER — Encounter (HOSPITAL_COMMUNITY)
Admission: RE | Disposition: A | Discharge status: Home Health | Payer: Self-pay | Source: Home / Self Care | Attending: Neurological Surgery

## 2023-11-06 ENCOUNTER — Other Ambulatory Visit: Payer: Self-pay

## 2023-11-06 ENCOUNTER — Inpatient Hospital Stay (HOSPITAL_COMMUNITY): Payer: Self-pay | Admitting: Vascular Surgery

## 2023-11-06 ENCOUNTER — Encounter (HOSPITAL_COMMUNITY): Payer: Self-pay | Admitting: Neurological Surgery

## 2023-11-06 ENCOUNTER — Inpatient Hospital Stay (HOSPITAL_COMMUNITY)
Admission: RE | Admit: 2023-11-06 | Discharge: 2023-11-08 | DRG: 448 | Disposition: A | Discharge status: Home Health | Attending: Neurological Surgery | Admitting: Neurological Surgery

## 2023-11-06 DIAGNOSIS — W19XXXA Unspecified fall, initial encounter: Secondary | ICD-10-CM | POA: Diagnosis present

## 2023-11-06 DIAGNOSIS — E039 Hypothyroidism, unspecified: Secondary | ICD-10-CM | POA: Diagnosis present

## 2023-11-06 DIAGNOSIS — F418 Other specified anxiety disorders: Secondary | ICD-10-CM

## 2023-11-06 DIAGNOSIS — S22081A Stable burst fracture of T11-T12 vertebra, initial encounter for closed fracture: Principal | ICD-10-CM | POA: Diagnosis present

## 2023-11-06 DIAGNOSIS — Z79890 Hormone replacement therapy (postmenopausal): Secondary | ICD-10-CM | POA: Diagnosis not present

## 2023-11-06 DIAGNOSIS — F419 Anxiety disorder, unspecified: Secondary | ICD-10-CM | POA: Diagnosis present

## 2023-11-06 DIAGNOSIS — I2510 Atherosclerotic heart disease of native coronary artery without angina pectoris: Secondary | ICD-10-CM

## 2023-11-06 DIAGNOSIS — E785 Hyperlipidemia, unspecified: Secondary | ICD-10-CM | POA: Diagnosis present

## 2023-11-06 DIAGNOSIS — I10 Essential (primary) hypertension: Secondary | ICD-10-CM | POA: Diagnosis present

## 2023-11-06 DIAGNOSIS — Z7985 Long-term (current) use of injectable non-insulin antidiabetic drugs: Secondary | ICD-10-CM

## 2023-11-06 DIAGNOSIS — Z85828 Personal history of other malignant neoplasm of skin: Secondary | ICD-10-CM | POA: Diagnosis not present

## 2023-11-06 DIAGNOSIS — E119 Type 2 diabetes mellitus without complications: Secondary | ICD-10-CM | POA: Diagnosis present

## 2023-11-06 DIAGNOSIS — F32A Depression, unspecified: Secondary | ICD-10-CM | POA: Diagnosis present

## 2023-11-06 DIAGNOSIS — Z8674 Personal history of sudden cardiac arrest: Secondary | ICD-10-CM | POA: Diagnosis not present

## 2023-11-06 DIAGNOSIS — Z981 Arthrodesis status: Principal | ICD-10-CM

## 2023-11-06 DIAGNOSIS — S22080A Wedge compression fracture of T11-T12 vertebra, initial encounter for closed fracture: Secondary | ICD-10-CM

## 2023-11-06 DIAGNOSIS — M4804 Spinal stenosis, thoracic region: Secondary | ICD-10-CM | POA: Diagnosis present

## 2023-11-06 DIAGNOSIS — K219 Gastro-esophageal reflux disease without esophagitis: Secondary | ICD-10-CM | POA: Diagnosis present

## 2023-11-06 DIAGNOSIS — Z79899 Other long term (current) drug therapy: Secondary | ICD-10-CM

## 2023-11-06 DIAGNOSIS — F4310 Post-traumatic stress disorder, unspecified: Secondary | ICD-10-CM | POA: Diagnosis present

## 2023-11-06 DIAGNOSIS — Z01818 Encounter for other preprocedural examination: Secondary | ICD-10-CM

## 2023-11-06 HISTORY — PX: LAMINECTOMY WITH POSTERIOR LATERAL ARTHRODESIS LEVEL 4: SHX6338

## 2023-11-06 LAB — GLUCOSE, CAPILLARY
Glucose-Capillary: 133 mg/dL — ABNORMAL HIGH (ref 70–99)
Glucose-Capillary: 116 mg/dL — ABNORMAL HIGH (ref 70–99)
Glucose-Capillary: 112 mg/dL — ABNORMAL HIGH (ref 70–99)
Glucose-Capillary: 159 mg/dL — ABNORMAL HIGH (ref 70–99)
Glucose-Capillary: 244 mg/dL — ABNORMAL HIGH (ref 70–99)

## 2023-11-06 SURGERY — LAMINECTOMY WITH POSTERIOR LATERAL ARTHRODESIS LEVEL 4
Anesthesia: General | Site: Back

## 2023-11-06 MED ORDER — THROMBIN 5000 UNITS EX SOLR
OROMUCOSAL | Status: DC | PRN
  Administered 2023-11-06: 5 mL via TOPICAL

## 2023-11-06 MED ORDER — ALBUMIN HUMAN 5 % IV SOLN
INTRAVENOUS | Status: DC | PRN
Start: 2023-11-06 — End: 2023-11-06

## 2023-11-06 MED ORDER — 0.9 % SODIUM CHLORIDE (POUR BTL) OPTIME
TOPICAL | Status: DC | PRN
Start: 2023-11-06 — End: 2023-11-06
  Administered 2023-11-06: 1000 mL

## 2023-11-06 MED ORDER — HYDROMORPHONE HCL 1 MG/ML IJ SOLN
INTRAMUSCULAR | Status: AC
  Filled 2023-11-06: qty 0.5

## 2023-11-06 MED ORDER — PROPOFOL 10 MG/ML IV BOLUS
INTRAVENOUS | Status: DC | PRN
  Administered 2023-11-06: 120 mg via INTRAVENOUS

## 2023-11-06 MED ORDER — SODIUM CHLORIDE 0.9 % IV SOLN
0.1500 ug/kg/min | INTRAVENOUS | Status: AC
  Administered 2023-11-06: .07 ug/kg/min via INTRAVENOUS
  Filled 2023-11-06: qty 5000

## 2023-11-06 MED ORDER — SODIUM CHLORIDE 0.9% FLUSH
3.0000 mL | Freq: Two times a day (BID) | INTRAVENOUS | Status: DC
  Administered 2023-11-06 – 2023-11-07 (×3): 3 mL via INTRAVENOUS

## 2023-11-06 MED ORDER — CARVEDILOL 6.25 MG PO TABS
6.2500 mg | ORAL_TABLET | Freq: Two times a day (BID) | ORAL | Status: DC
  Administered 2023-11-06 – 2023-11-07 (×2): 6.25 mg via ORAL
  Filled 2023-11-06 (×2): qty 1

## 2023-11-06 MED ORDER — OXYCODONE HCL 5 MG PO TABS
ORAL_TABLET | ORAL | Status: AC
  Filled 2023-11-06: qty 1

## 2023-11-06 MED ORDER — SODIUM CHLORIDE 0.9% FLUSH
3.0000 mL | INTRAVENOUS | Status: DC | PRN
Start: 2023-11-06 — End: 2023-11-08

## 2023-11-06 MED ORDER — LIDOCAINE 2% (20 MG/ML) 5 ML SYRINGE
INTRAMUSCULAR | Status: AC
Start: 2023-11-06 — End: 2023-11-06
  Filled 2023-11-06: qty 5

## 2023-11-06 MED ORDER — CEFAZOLIN SODIUM-DEXTROSE 2-4 GM/100ML-% IV SOLN
2.0000 g | INTRAVENOUS | Status: AC
  Administered 2023-11-06: 2 g via INTRAVENOUS
  Filled 2023-11-06: qty 100

## 2023-11-06 MED ORDER — POTASSIUM CHLORIDE IN NACL 20-0.9 MEQ/L-% IV SOLN
INTRAVENOUS | Status: AC
  Filled 2023-11-06: qty 1000

## 2023-11-06 MED ORDER — ACETAMINOPHEN 500 MG PO TABS
1000.0000 mg | ORAL_TABLET | ORAL | Status: AC
  Administered 2023-11-06: 1000 mg via ORAL
  Filled 2023-11-06: qty 2

## 2023-11-06 MED ORDER — ACETAMINOPHEN 500 MG PO TABS
1000.0000 mg | ORAL_TABLET | Freq: Four times a day (QID) | ORAL | Status: AC
  Administered 2023-11-06 – 2023-11-07 (×4): 1000 mg via ORAL
  Filled 2023-11-06 (×4): qty 2

## 2023-11-06 MED ORDER — ROCURONIUM BROMIDE 10 MG/ML (PF) SYRINGE
PREFILLED_SYRINGE | INTRAVENOUS | Status: DC | PRN
  Administered 2023-11-06: 20 mg via INTRAVENOUS
  Administered 2023-11-06: 60 mg via INTRAVENOUS
  Administered 2023-11-06: 10 mg via INTRAVENOUS

## 2023-11-06 MED ORDER — MORPHINE SULFATE (PF) 2 MG/ML IV SOLN
2.0000 mg | INTRAVENOUS | Status: DC | PRN
  Administered 2023-11-07: 2 mg via INTRAVENOUS
  Filled 2023-11-06: qty 1

## 2023-11-06 MED ORDER — ONDANSETRON HCL 4 MG/2ML IJ SOLN
INTRAMUSCULAR | Status: DC | PRN
  Administered 2023-11-06: 4 mg via INTRAVENOUS

## 2023-11-06 MED ORDER — BUPIVACAINE HCL (PF) 0.25 % IJ SOLN
INTRAMUSCULAR | Status: DC | PRN
  Administered 2023-11-06: 10 mL

## 2023-11-06 MED ORDER — TIZANIDINE HCL 4 MG PO TABS
4.0000 mg | ORAL_TABLET | Freq: Four times a day (QID) | ORAL | Status: DC
  Administered 2023-11-06 – 2023-11-08 (×7): 4 mg via ORAL
  Filled 2023-11-06 (×7): qty 1

## 2023-11-06 MED ORDER — THROMBIN 20000 UNITS EX SOLR
CUTANEOUS | Status: DC | PRN
  Administered 2023-11-06: 10 mL via TOPICAL

## 2023-11-06 MED ORDER — OXYCODONE HCL 5 MG PO TABS
5.0000 mg | ORAL_TABLET | Freq: Once | ORAL | Status: AC | PRN
  Administered 2023-11-06: 5 mg via ORAL

## 2023-11-06 MED ORDER — HYDROMORPHONE HCL 1 MG/ML IJ SOLN
0.5000 mg | INTRAMUSCULAR | Status: AC | PRN
  Administered 2023-11-06 (×4): 0.5 mg via INTRAVENOUS

## 2023-11-06 MED ORDER — PROPOFOL 10 MG/ML IV BOLUS
INTRAVENOUS | Status: AC
Start: 2023-11-06 — End: 2023-11-06
  Filled 2023-11-06: qty 20

## 2023-11-06 MED ORDER — SUGAMMADEX SODIUM 200 MG/2ML IV SOLN
INTRAVENOUS | Status: DC | PRN
  Administered 2023-11-06: 200 mg via INTRAVENOUS

## 2023-11-06 MED ORDER — OXYCODONE HCL 5 MG PO TABS
15.0000 mg | ORAL_TABLET | ORAL | Status: DC | PRN
  Administered 2023-11-06 – 2023-11-08 (×10): 15 mg via ORAL
  Filled 2023-11-06 (×11): qty 3

## 2023-11-06 MED ORDER — INSULIN ASPART 100 UNIT/ML IJ SOLN: 0.0000 [IU] | INTRAMUSCULAR | Status: DC | PRN

## 2023-11-06 MED ORDER — PHENYLEPHRINE 80 MCG/ML (10ML) SYRINGE FOR IV PUSH (FOR BLOOD PRESSURE SUPPORT)
PREFILLED_SYRINGE | INTRAVENOUS | Status: DC | PRN
  Administered 2023-11-06: 160 ug via INTRAVENOUS

## 2023-11-06 MED ORDER — CEFAZOLIN SODIUM-DEXTROSE 2-4 GM/100ML-% IV SOLN
2.0000 g | Freq: Three times a day (TID) | INTRAVENOUS | Status: AC
  Administered 2023-11-06 (×2): 2 g via INTRAVENOUS
  Filled 2023-11-06 (×2): qty 100

## 2023-11-06 MED ORDER — LIDOCAINE 2% (20 MG/ML) 5 ML SYRINGE
INTRAMUSCULAR | Status: DC | PRN
  Administered 2023-11-06: 100 mg via INTRAVENOUS

## 2023-11-06 MED ORDER — VASHE WOUND IRRIGATION OPTIME
TOPICAL | Status: DC | PRN
  Administered 2023-11-06: 16 [oz_av] via TOPICAL

## 2023-11-06 MED ORDER — DULOXETINE HCL 60 MG PO CPEP
60.0000 mg | ORAL_CAPSULE | Freq: Two times a day (BID) | ORAL | Status: DC
Start: 2023-11-06 — End: 2023-11-08
  Administered 2023-11-06 – 2023-11-07 (×3): 60 mg via ORAL
  Filled 2023-11-06 (×3): qty 1

## 2023-11-06 MED ORDER — LEVOTHYROXINE SODIUM 50 MCG PO TABS
50.0000 ug | ORAL_TABLET | ORAL | Status: DC
  Administered 2023-11-07 – 2023-11-08 (×2): 50 ug via ORAL
  Filled 2023-11-06 (×2): qty 1
  Filled 2023-11-06 (×2): qty 2

## 2023-11-06 MED ORDER — HYDROMORPHONE HCL 1 MG/ML IJ SOLN
INTRAMUSCULAR | Status: AC
  Filled 2023-11-06: qty 1

## 2023-11-06 MED ORDER — KETAMINE HCL 10 MG/ML IJ SOLN
INTRAMUSCULAR | Status: DC | PRN
  Administered 2023-11-06: 20 mg via INTRAVENOUS

## 2023-11-06 MED ORDER — PANTOPRAZOLE SODIUM 40 MG PO TBEC
40.0000 mg | DELAYED_RELEASE_TABLET | Freq: Two times a day (BID) | ORAL | Status: DC
  Administered 2023-11-06 – 2023-11-07 (×4): 40 mg via ORAL
  Filled 2023-11-06 (×4): qty 1

## 2023-11-06 MED ORDER — FENTANYL CITRATE (PF) 250 MCG/5ML IJ SOLN
INTRAMUSCULAR | Status: DC | PRN
  Administered 2023-11-06: 50 ug via INTRAVENOUS
  Administered 2023-11-06: 100 ug via INTRAVENOUS

## 2023-11-06 MED ORDER — TRAZODONE HCL 150 MG PO TABS
600.0000 mg | ORAL_TABLET | Freq: Every day | ORAL | Status: DC
  Administered 2023-11-06 – 2023-11-07 (×2): 600 mg via ORAL
  Filled 2023-11-06 (×2): qty 4

## 2023-11-06 MED ORDER — OXYCODONE HCL 5 MG/5ML PO SOLN: 5.0000 mg | Freq: Once | ORAL | Status: AC | PRN

## 2023-11-06 MED ORDER — BUPIVACAINE HCL (PF) 0.25 % IJ SOLN
INTRAMUSCULAR | Status: AC
Start: 2023-11-06 — End: 2023-11-06
  Filled 2023-11-06: qty 30

## 2023-11-06 MED ORDER — PHENYLEPHRINE HCL-NACL 20-0.9 MG/250ML-% IV SOLN
INTRAVENOUS | Status: AC
Start: 2023-11-06 — End: 2023-11-06
  Filled 2023-11-06: qty 250

## 2023-11-06 MED ORDER — PRAZOSIN HCL 2 MG PO CAPS
5.0000 mg | ORAL_CAPSULE | Freq: Every day | ORAL | Status: DC
  Administered 2023-11-06 – 2023-11-08 (×2): 5 mg via ORAL
  Filled 2023-11-06 (×2): qty 1

## 2023-11-06 MED ORDER — DEXAMETHASONE SODIUM PHOSPHATE 10 MG/ML IJ SOLN
INTRAMUSCULAR | Status: DC | PRN
  Administered 2023-11-06: 10 mg via INTRAVENOUS

## 2023-11-06 MED ORDER — FENTANYL CITRATE (PF) 100 MCG/2ML IJ SOLN
INTRAMUSCULAR | Status: AC
  Filled 2023-11-06: qty 2

## 2023-11-06 MED ORDER — MIDAZOLAM HCL 2 MG/2ML IJ SOLN
INTRAMUSCULAR | Status: DC | PRN
  Administered 2023-11-06: 2 mg via INTRAVENOUS

## 2023-11-06 MED ORDER — INSULIN ASPART 100 UNIT/ML IJ SOLN
0.0000 [IU] | Freq: Three times a day (TID) | INTRAMUSCULAR | Status: DC
  Administered 2023-11-07 (×2): 2 [IU] via SUBCUTANEOUS

## 2023-11-06 MED ORDER — ORAL CARE MOUTH RINSE: 15.0000 mL | Freq: Once | OROMUCOSAL | Status: AC

## 2023-11-06 MED ORDER — SODIUM CHLORIDE 0.9 % IV SOLN
250.0000 mL | INTRAVENOUS | Status: AC
  Administered 2023-11-06: 250 mL via INTRAVENOUS

## 2023-11-06 MED ORDER — CHLORHEXIDINE GLUCONATE CLOTH 2 % EX PADS: 6.0000 | MEDICATED_PAD | Freq: Once | CUTANEOUS | Status: DC

## 2023-11-06 MED ORDER — FENTANYL CITRATE (PF) 100 MCG/2ML IJ SOLN
INTRAMUSCULAR | Status: AC
Start: 2023-11-06 — End: 2023-11-06
  Filled 2023-11-06: qty 2

## 2023-11-06 MED ORDER — ACETAMINOPHEN 10 MG/ML IV SOLN: 1000.0000 mg | Freq: Once | INTRAVENOUS | Status: DC | PRN

## 2023-11-06 MED ORDER — THROMBIN 20000 UNITS EX SOLR
CUTANEOUS | Status: AC
  Filled 2023-11-06: qty 20000

## 2023-11-06 MED ORDER — HYDROMORPHONE HCL 1 MG/ML IJ SOLN
INTRAMUSCULAR | Status: DC | PRN
  Administered 2023-11-06 (×2): .5 mg via INTRAVENOUS

## 2023-11-06 MED ORDER — SUGAMMADEX SODIUM 200 MG/2ML IV SOLN
INTRAVENOUS | Status: AC
  Filled 2023-11-06: qty 2

## 2023-11-06 MED ORDER — CHLORHEXIDINE GLUCONATE 0.12 % MT SOLN
15.0000 mL | Freq: Once | OROMUCOSAL | Status: AC
  Administered 2023-11-06: 15 mL via OROMUCOSAL
  Filled 2023-11-06: qty 15

## 2023-11-06 MED ORDER — SENNA 8.6 MG PO TABS
1.0000 | ORAL_TABLET | Freq: Two times a day (BID) | ORAL | Status: DC
  Administered 2023-11-06 – 2023-11-07 (×4): 8.6 mg via ORAL
  Filled 2023-11-06 (×4): qty 1

## 2023-11-06 MED ORDER — INSULIN ASPART 100 UNIT/ML IJ SOLN
0.0000 [IU] | Freq: Every day | INTRAMUSCULAR | Status: DC
  Administered 2023-11-06: 2 [IU] via SUBCUTANEOUS

## 2023-11-06 MED ORDER — PHENOL 1.4 % MT LIQD: 1.0000 | OROMUCOSAL | Status: DC | PRN

## 2023-11-06 MED ORDER — KETAMINE HCL 50 MG/5ML IJ SOSY
PREFILLED_SYRINGE | INTRAMUSCULAR | Status: AC
  Filled 2023-11-06: qty 5

## 2023-11-06 MED ORDER — ONDANSETRON HCL 4 MG PO TABS: 4.0000 mg | ORAL_TABLET | Freq: Four times a day (QID) | ORAL | Status: DC | PRN

## 2023-11-06 MED ORDER — MIDAZOLAM HCL 2 MG/2ML IJ SOLN
INTRAMUSCULAR | Status: AC
  Filled 2023-11-06: qty 2

## 2023-11-06 MED ORDER — ONDANSETRON HCL 4 MG/2ML IJ SOLN
4.0000 mg | Freq: Four times a day (QID) | INTRAMUSCULAR | Status: DC | PRN
  Administered 2023-11-06: 4 mg via INTRAVENOUS
  Filled 2023-11-06: qty 2

## 2023-11-06 MED ORDER — FENTANYL CITRATE (PF) 250 MCG/5ML IJ SOLN
INTRAMUSCULAR | Status: AC
  Filled 2023-11-06: qty 5

## 2023-11-06 MED ORDER — LACTATED RINGERS IV SOLN: INTRAVENOUS | Status: DC

## 2023-11-06 MED ORDER — FENTANYL CITRATE (PF) 100 MCG/2ML IJ SOLN
25.0000 ug | INTRAMUSCULAR | Status: DC | PRN
  Administered 2023-11-06 (×4): 50 ug via INTRAVENOUS

## 2023-11-06 MED ORDER — CELECOXIB 200 MG PO CAPS
200.0000 mg | ORAL_CAPSULE | Freq: Two times a day (BID) | ORAL | Status: DC
  Administered 2023-11-06 – 2023-11-07 (×3): 200 mg via ORAL
  Filled 2023-11-06 (×5): qty 1

## 2023-11-06 MED ORDER — MENTHOL 3 MG MT LOZG: 1.0000 | LOZENGE | OROMUCOSAL | Status: DC | PRN

## 2023-11-06 MED ORDER — GABAPENTIN 300 MG PO CAPS
300.0000 mg | ORAL_CAPSULE | ORAL | Status: AC
  Administered 2023-11-06: 300 mg via ORAL
  Filled 2023-11-06: qty 1

## 2023-11-06 MED ORDER — HYDROMORPHONE HCL 1 MG/ML IJ SOLN
INTRAMUSCULAR | Status: AC
Start: 2023-11-06 — End: 2023-11-06
  Filled 2023-11-06: qty 0.5

## 2023-11-06 MED ORDER — PHENYLEPHRINE HCL-NACL 20-0.9 MG/250ML-% IV SOLN
INTRAVENOUS | Status: DC | PRN
  Administered 2023-11-06: 30 ug/min via INTRAVENOUS

## 2023-11-06 MED ORDER — ARIPIPRAZOLE 10 MG PO TABS
10.0000 mg | ORAL_TABLET | Freq: Every day | ORAL | Status: DC
  Administered 2023-11-07: 10 mg via ORAL
  Filled 2023-11-06 (×2): qty 1

## 2023-11-06 MED ORDER — RENA-VITE PO TABS
1.0000 | ORAL_TABLET | Freq: Every day | ORAL | Status: DC
  Administered 2023-11-06 – 2023-11-07 (×2): 1 via ORAL
  Filled 2023-11-06 (×2): qty 1

## 2023-11-06 MED ORDER — ROCURONIUM BROMIDE 10 MG/ML (PF) SYRINGE
PREFILLED_SYRINGE | INTRAVENOUS | Status: AC
  Filled 2023-11-06: qty 10

## 2023-11-06 MED ORDER — THROMBIN 5000 UNITS EX KIT
PACK | CUTANEOUS | Status: AC
  Filled 2023-11-06: qty 1

## 2023-11-06 MED ORDER — PREGABALIN 50 MG PO CAPS
50.0000 mg | ORAL_CAPSULE | Freq: Two times a day (BID) | ORAL | Status: DC
  Administered 2023-11-06 – 2023-11-07 (×3): 50 mg via ORAL
  Filled 2023-11-06 (×3): qty 1

## 2023-11-06 SURGICAL SUPPLY — 57 items
ALLOGRAFT BONE FIBER KORE 5 (Bone Implant) IMPLANT
BAG COUNTER SPONGE SURGICOUNT (BAG) ×1 IMPLANT
BASKET BONE COLLECTION (BASKET) IMPLANT
BENZOIN TINCTURE PRP APPL 2/3 (GAUZE/BANDAGES/DRESSINGS) ×1 IMPLANT
BLADE BONE MILL MEDIUM (MISCELLANEOUS) IMPLANT
BLADE CLIPPER SURG (BLADE) IMPLANT
BUR CARBIDE MATCH 3.0 (BURR) ×1 IMPLANT
CANISTER SUCTION 3000ML PPV (SUCTIONS) ×1 IMPLANT
CLEANSER WND VASHE INSTL 475 (WOUND CARE) ×1 IMPLANT
CNTNR URN SCR LID CUP LEK RST (MISCELLANEOUS) ×1 IMPLANT
COVER BACK TABLE 60X90IN (DRAPES) ×1 IMPLANT
DERMABOND ADVANCED .7 DNX12 (GAUZE/BANDAGES/DRESSINGS) IMPLANT
DRAPE C-ARM 42X72 X-RAY (DRAPES) IMPLANT
DRAPE C-ARMOR (DRAPES) IMPLANT
DRAPE LAPAROTOMY 100X72X124 (DRAPES) ×1 IMPLANT
DRAPE SURG 17X23 STRL (DRAPES) ×1 IMPLANT
DRESSING PEEL AND PLAC PRVNA20 (GAUZE/BANDAGES/DRESSINGS) IMPLANT
DRSG OPSITE 4X5.5 SM (GAUZE/BANDAGES/DRESSINGS) IMPLANT
DURAPREP 26ML APPLICATOR (WOUND CARE) ×1 IMPLANT
ELECTRODE REM PT RTRN 9FT ADLT (ELECTROSURGICAL) ×1 IMPLANT
EVACUATOR 1/8 PVC DRAIN (DRAIN) IMPLANT
GAUZE 4X4 16PLY ~~LOC~~+RFID DBL (SPONGE) IMPLANT
GLOVE BIO SURGEON STRL SZ7 (GLOVE) IMPLANT
GLOVE BIO SURGEON STRL SZ8 (GLOVE) ×2 IMPLANT
GLOVE BIOGEL PI IND STRL 7.0 (GLOVE) IMPLANT
GOWN STRL REUS W/ TWL LRG LVL3 (GOWN DISPOSABLE) IMPLANT
GOWN STRL REUS W/ TWL XL LVL3 (GOWN DISPOSABLE) ×2 IMPLANT
GOWN STRL REUS W/TWL 2XL LVL3 (GOWN DISPOSABLE) IMPLANT
HEMOSTAT POWDER KIT SURGIFOAM (HEMOSTASIS) ×1 IMPLANT
KIT BASIN OR (CUSTOM PROCEDURE TRAY) ×1 IMPLANT
KIT DRSG PREVENA PLUS 7DAY 125 (MISCELLANEOUS) IMPLANT
KIT TURNOVER KIT B (KITS) ×1 IMPLANT
MILL BONE PREP (MISCELLANEOUS) IMPLANT
NDL HYPO 25X1 1.5 SAFETY (NEEDLE) ×1 IMPLANT
NEEDLE HYPO 25X1 1.5 SAFETY (NEEDLE) ×1 IMPLANT
NS IRRIG 1000ML POUR BTL (IV SOLUTION) ×1 IMPLANT
PACK LAMINECTOMY NEURO (CUSTOM PROCEDURE TRAY) ×1 IMPLANT
PAD ARMBOARD POSITIONER FOAM (MISCELLANEOUS) ×3 IMPLANT
PATTIES SURGICAL .5 X.5 (GAUZE/BANDAGES/DRESSINGS) ×1 IMPLANT
PATTIES SURGICAL 1X1 (DISPOSABLE) ×1 IMPLANT
POWDER MORCELSS FINE 2000MG (Miscellaneous) IMPLANT
ROD LORD LIP 5.5X130 (Rod) IMPLANT
ROD STRT INV 5.5X400 (Rod) IMPLANT
SCREW CANC PA 5.5X40 (Screw) IMPLANT
SCREW KODIAK 5.5X45 (Screw) IMPLANT
SET SCREW SPNE (Screw) IMPLANT
SPONGE SURGIFOAM ABS GEL 100 (HEMOSTASIS) ×1 IMPLANT
SPONGE T-LAP 4X18 ~~LOC~~+RFID (SPONGE) IMPLANT
STRIP CLOSURE SKIN 1/2X4 (GAUZE/BANDAGES/DRESSINGS) ×2 IMPLANT
SUT VIC AB 0 CT1 18XCR BRD8 (SUTURE) ×1 IMPLANT
SUT VIC AB 2-0 CP2 18 (SUTURE) ×1 IMPLANT
SUT VIC AB 3-0 SH 8-18 (SUTURE) ×2 IMPLANT
SYR 30ML SLIP (SYRINGE) ×1 IMPLANT
TOWEL GREEN STERILE (TOWEL DISPOSABLE) ×1 IMPLANT
TOWEL GREEN STERILE FF (TOWEL DISPOSABLE) ×1 IMPLANT
TRAY FOLEY MTR SLVR 16FR STAT (SET/KITS/TRAYS/PACK) IMPLANT
WATER STERILE IRR 1000ML POUR (IV SOLUTION) ×1 IMPLANT

## 2023-11-06 NOTE — Anesthesia Procedure Notes (Signed)
 Procedure Name: Intubation Date/Time: 11/06/2023 11:31 AM  Performed by: Mathew Bernardino RAMAN, RNPre-anesthesia Checklist: Patient identified, Emergency Drugs available, Suction available and Patient being monitored Patient Re-evaluated:Patient Re-evaluated prior to induction Oxygen  Delivery Method: Circle System Utilized Preoxygenation: Pre-oxygenation with 100% oxygen  Induction Type: IV induction Ventilation: Nasal airway inserted- appropriate to patient size Laryngoscope Size: Mac and 3 Grade View: Grade I Tube type: Oral Tube size: 6.5 mm Number of attempts: 1 Airway Equipment and Method: Stylet and Oral airway Placement Confirmation: ETT inserted through vocal cords under direct vision, positive ETCO2 and breath sounds checked- equal and bilateral Secured at: 22 cm Tube secured with: Tape Dental Injury: Teeth and Oropharynx as per pre-operative assessment

## 2023-11-06 NOTE — Transfer of Care (Signed)
 Immediate Anesthesia Transfer of Care Note  Patient: [name]  Procedure(s) Performed: LAMINECTOMY WITH POSTERIOR LATERAL ARTHRODESIS THORACIC TEN- THORACIC ELEVEN, THORACIC ELEVEN- THORACIC TWELVE, THORACIC TWELVE- LUMBAR ONE- LUMBAR TWO, LAMINECTOMY THORACIC TWELVE (Back)  Patient Location: PACU  Anesthesia Type:General  Level of Consciousness: awake, alert , oriented, and patient cooperative  Airway & Oxygen  Therapy: Patient Spontanous Breathing and Patient connected to face mask oxygen   Post-op Assessment: Report given to RN, Post -op Vital signs reviewed and stable, and Patient moving all extremities  Post vital signs: Reviewed and stable  Last Vitals:  Vitals Value Taken Time  BP 101/78 11/06/23 14:31  Temp 36.7 C 11/06/23 14:23  Pulse 87 11/06/23 14:32  Resp 15 11/06/23 14:32  SpO2 91 % 11/06/23 14:32  Vitals shown include unfiled device data.  Last Pain:  Vitals:   11/06/23 1041  TempSrc:   PainSc: 5       Patients Stated Pain Goal: 2 (11/06/23 1041)  Complications: No notable events documented.

## 2023-11-06 NOTE — H&P (Signed)
 Subjective: Patient is a 53 y.o. female admitted for t12 fracture. Onset of symptoms was several weeks ago, gradually worsening since that time.  The pain is rated severe, and is located at the across the lower back and radiates to legs. The pain is described as aching and occurs all day. The symptoms have been progressive. Symptoms are exacerbated by walking for more than a few minutes. MRI or CT showed T12 fracture with stenosis   Past Medical History:  Diagnosis Date   Anxiety    Arthritis    joints ache all over (10/15/2014)   Barrett's esophagus    Bulging lumbar disc    Cardiac arrest (HCC)    Chronic lower back pain    Coronary artery disease    DDD (degenerative disc disease), cervical    Depression    DM (diabetes mellitus) (HCC)    Drug-seeking behavior    GERD (gastroesophageal reflux disease)    Headache    weekly (10/15/2014)   Hyperlipemia    Hypertension    Hypothyroidism    PTSD (post-traumatic stress disorder)    Skin cancer    had them cut off my arms; don't know what kind    Past Surgical History:  Procedure Laterality Date   ABLATION ON ENDOMETRIOSIS  2008   BIOPSY  12/27/2018   Procedure: BIOPSY;  Surgeon: Eda Iha, MD;  Location: [facility] ENDOSCOPY;  Service: Gastroenterology;;   BIOPSY  10/05/2021   Procedure: BIOPSY;  Surgeon: Avram Lupita BRAVO, MD;  Location: [facility] ENDOSCOPY;  Service: Gastroenterology;;   ESOPHAGOGASTRODUODENOSCOPY (EGD) WITH PROPOFOL  N/A 12/27/2018   Procedure: ESOPHAGOGASTRODUODENOSCOPY (EGD) WITH PROPOFOL ;  Surgeon: Eda Iha, MD;  Location: [facility] ENDOSCOPY;  Service: Gastroenterology;  Laterality: N/A;   ESOPHAGOGASTRODUODENOSCOPY (EGD) WITH PROPOFOL  N/A 10/05/2021   Procedure: ESOPHAGOGASTRODUODENOSCOPY (EGD) WITH PROPOFOL ;  Surgeon: Avram Lupita BRAVO, MD;  Location: [facility] ENDOSCOPY;  Service: Gastroenterology;  Laterality: N/A;   HEMORRHOID SURGERY  ~ 2002   IR FLUORO GUIDE CV LINE RIGHT  06/14/2020   IR REMOVAL TUN CV CATH W/O FL   06/23/2020   IR US  GUIDE VASC ACCESS RIGHT  06/14/2020   ORIF ANKLE FRACTURE Right 03/28/2020   Procedure: OPEN REDUCTION INTERNAL FIXATION (ORIF) RIGHT BIMALLEOLAR ANKLE FRACTURE;  Surgeon: Josefina Chew, MD;  Location: [facility];  Service: Orthopedics;  Laterality: Right;    Prior to Admission medications   Medication Sig Start Date End Date Taking? Authorizing Provider  acetaminophen  (TYLENOL ) 650 MG CR tablet Take 1,300 mg by mouth every 8 (eight) hours as needed for pain.   Yes [provider]  ARIPiprazole  (ABILIFY ) 10 MG tablet Take 1 tablet (10 mg total) by mouth daily. 10/16/23  Yes   DULoxetine  (CYMBALTA ) 60 MG capsule Take 1 capsule (60 mg total) by mouth 2 (two) times daily. 10/16/23  Yes   levothyroxine  (SYNTHROID ) 50 MCG tablet Take 50 mcg by mouth daily before breakfast.   Yes [provider]  lidocaine  (LIDODERM ) 5 % Place 1 patch onto the skin daily. Remove & Discard patch within 12 hours or as directed by MD 10/11/23  Yes Rai, Ripudeep K, MD  metoCLOPramide  (REGLAN ) 5 MG tablet Take 1 tablet (5 mg total) by mouth every 8 (eight) hours as needed for nausea or vomiting. 10/11/23  Yes Rai, Ripudeep K, MD  multivitamin (RENA-VIT) TABS tablet Take 1 tablet by mouth at bedtime. 08/01/20  Yes Tobie Burgess Opoka, MD  ondansetron  (ZOFRAN -ODT) 8 MG disintegrating tablet Take 1 tablet (8 mg total) by  mouth daily as needed for nausea or vomiting. 10/11/23  Yes Rai, Ripudeep K, MD  pantoprazole  (PROTONIX ) 40 MG tablet Take 1 tablet by mouth twice daily 09/21/22  Yes Kerman Vina HERO, NP  prazosin  (MINIPRESS ) 5 MG capsule Take 1 capsule (5 mg total) by mouth at bedtime. 10/16/23  Yes   pregabalin  (LYRICA ) 50 MG capsule Take 50 mg by mouth 2 (two) times daily. 07/10/23  Yes [provider]  tirzepatide  (MOUNJARO ) 7.5 MG/0.5ML Pen Inject 7.5 mg into the skin once a week. 10/22/23  Yes   tiZANidine  (ZANAFLEX ) 4 MG tablet Take 1 tablet (4 mg total) by mouth every 6  (six) hours as needed. 10/22/23  Yes   trazodone  (DESYREL ) 300 MG tablet Take 2 tablets (600 mg total) by mouth at bedtime. 10/16/23  Yes   atorvastatin  (LIPITOR) 20 MG tablet Take 1 tablet (20 mg total) by mouth daily. 09/28/23     carvedilol  (COREG ) 6.25 MG tablet Take 1 tablet (6.25 mg total) by mouth 2 (two) times daily with a meal. 10/26/23   Gonfa, Mignon DASEN, MD  ibuprofen  (ADVIL ) 200 MG tablet Take 400 mg by mouth daily as needed for moderate pain (pain score 4-6) or mild pain (pain score 1-3).    [provider]  oxyCODONE  (ROXICODONE ) 15 MG immediate release tablet Take 1 tablet (15 mg total) by mouth 4 (four) times daily as needed. 10/31/23     PROLIA 60 MG/ML SOSY injection Inject 60 mg into the skin every 6 (six) months. 09/30/23   [provider]  Vitamin D , Ergocalciferol , (DRISDOL ) 1.25 MG (50000 UNIT) CAPS capsule Take 1 capsule (50,000 Units total) by mouth every 7 (seven) days. 11/01/23   Gonfa, Taye T, MD   No Known Allergies  Social History   Tobacco Use   Smoking status: Never   Smokeless tobacco: Never  Substance Use Topics   Alcohol  use: Not Currently    Comment: 10/15/2014 I've drank before; nothing regular; don't drink now cause of RX I'm on    Family History  Problem Relation Age of Onset   Breast cancer Mother    Diabetes Mother    Breast cancer Maternal Grandmother    Breast cancer Paternal Grandmother    Colon polyps Paternal Grandmother    Colon cancer Neg Hx    Esophageal cancer Neg Hx    Rectal cancer Neg Hx    Stomach cancer Neg Hx      Review of Systems  Positive ROS: neg  All other systems have been reviewed and were otherwise negative with the exception of those mentioned in the HPI and as above.  Objective: Vital signs in last 24 hours: Temp:  [98.4 F (36.9 C)] 98.4 F (36.9 C) (07/16 1005) Pulse Rate:  [79] 79 (07/16 1005) Resp:  [18] 18 (07/16 1005) BP: (132)/(79) 132/79 (07/16 1005) SpO2:  [99 %] 99 % (07/16 1005) Weight:   [89.4 kg] 89.4 kg (07/16 1005)  General Appearance: Alert, cooperative, no distress, appears stated age Head: Normocephalic, without obvious abnormality, atraumatic Eyes: PERRL, conjunctiva/corneas clear, EOM's intact    Neck: Supple, symmetrical, trachea midline Back: Symmetric, no curvature, ROM normal, no CVA tenderness Lungs:  respirations unlabored Heart: Regular rate and rhythm Abdomen: Soft, non-tender Extremities: Extremities normal, atraumatic, no cyanosis or edema Pulses: 2+ and symmetric all extremities Skin: Skin color, texture, turgor normal, no rashes or lesions  NEUROLOGIC:   Mental status: Alert and oriented x4,  no aphasia, good attention span, fund of  knowledge, and memory Motor Exam - grossly normal Sensory Exam - grossly normal Reflexes: 2+ Coordination - grossly normal Gait -not tested Balance - not tested Cranial Nerves: I: smell Not tested  II: visual acuity  OS: nl    OD: nl  II: visual fields Full to confrontation  II: pupils Equal, round, reactive to light  III,VII: ptosis None  III,IV,VI: extraocular muscles  Full ROM  V: mastication Normal  V: facial light touch sensation  Normal  V,VII: corneal reflex  Present  VII: facial muscle function - upper  Normal  VII: facial muscle function - lower Normal  VIII: hearing Not tested  IX: soft palate elevation  Normal  IX,X: gag reflex Present  XI: trapezius strength  5/5  XI: sternocleidomastoid strength 5/5  XI: neck flexion strength  5/5  XII: tongue strength  Normal    Data Review Lab Results  Component Value Date   WBC 9.6 10/26/2023   HGB 10.1 (L) 10/26/2023   HCT 31.2 (L) 10/26/2023   MCV 85.0 10/26/2023   PLT 250 10/26/2023   Lab Results  Component Value Date   NA 138 10/26/2023   K 3.2 (L) 10/26/2023   CL 103 10/26/2023   CO2 27 10/26/2023   BUN 5 (L) 10/26/2023   CREATININE 0.62 10/26/2023   GLUCOSE 112 (H) 10/26/2023   Lab Results  Component Value Date   INR 1.0 06/26/2022     Assessment/Plan:  Estimated body mass index is 33.29 kg/m as calculated from the following:   Height as of this encounter: 5' 4.5 (1.638 m).   Weight as of this encounter: 89.4 kg. Patient admitted for ORIF T12 fracture. Patient has failed a reasonable attempt at conservative therapy.  I explained the condition and procedure to the patient and answered any questions.  Patient wishes to proceed with procedure as planned. Understands risks/ benefits and typical outcomes of procedure.   Alm GORMAN Molt 11/06/2023 10:31 AM

## 2023-11-06 NOTE — Op Note (Signed)
 11/06/2023  2:16 PM  PATIENT:  [name]  53 y.o. female  PRE-OPERATIVE DIAGNOSIS: T12 fracture with spinal stenosis  POST-OPERATIVE DIAGNOSIS:  same  PROCEDURE:   1.  Open reduction internal fixation of thoracic fracture with thoracic laminectomy T11 and T12 2. Posterior segmental fixation T10-L2 using ATEC pedicle screws.  3. Intertransverse arthrodesis T10-L2 using morcellized autograft and allograft.  SURGEON:  Alm Molt, MD  ASSISTANTS: Suzen Pean, FNP  ANESTHESIA:  General  EBL: 100 ml  Total I/O In: 250 [IV Piggyback:250] Out: 250 [Urine:100; Blood:150]  BLOOD ADMINISTERED:none  DRAINS: Medium Hemovac  INDICATION FOR PROCEDURE: This patient presented with severe back pain after a fall. Imaging revealed T12 burst fracture with retropulsion and spinal stenosis. The patient tried a reasonable attempt at conservative medical measures without relief. I recommended decompression and instrumented fusion to address the stenosis as well as the segmental  instability.  Patient understood the risks, benefits, and alternatives and potential outcomes and wished to proceed.  PROCEDURE DETAILS:  The patient was brought to the operating room. After induction of generalized endotracheal anesthesia the patient was rolled into the prone position on chest rolls and all pressure points were padded. The patient's lumbar region was cleaned and then prepped with DuraPrep and draped in the usual sterile fashion. Anesthesia was injected and then a dorsal midline incision was made and carried down to the lumbosacral fascia. The fascia was opened and the paraspinous musculature was taken down in a subperiosteal fashion to expose T10-L2. A self-retaining retractor was placed. Intraoperative fluoroscopy confirmed my level, and I started with placement of the L1 and L2 cortical pedicle screws. The pedicle screw entry zones were identified utilizing surface landmarks and  AP and lateral  fluoroscopy. I scored the cortex with the high-speed drill and then used the hand drill to drill an upward and outward direction into the pedicle. I then tapped line to line. I then placed a 5.5 x 45 mm cortical pedicle screw into the pedicles of L1 and L2 bilaterally.    I then turned my attention to the decompression and complete lumbar laminectomies, hemi- facetectomies, and foraminotomies were performed at T11 and T12.  My nurse practitioner was directly involved in the decompression and exposure of the neural elements.  We remove the spinous processes of T11 and T12 and then drilled the lamina down to an eggshell.  We open the yellow ligament removed in a piecemeal fashion bilaterally and removed any remaining lamina until the dura was full and capacious all the way across from the disc base of T11-12 down to the pedicle of L1.  The canal appeared to be well decompressed.  We then turned our attention to the placement of the thoracic pedicle screws. The pedicle screw entry zones were identified utilizing surface landmarks and AP and lateral fluoroscopy.  Marked each pedicle entry zone.  I drilled into each pedicle utilizing the hand drill, probed each pedicle with the pedicle probe and tapped each pedicle with the appropriate 4.5 tap. We palpated with a ball probe to assure no break in the cortex. We then placed 5.5 x 45 mm pedicle screws into the pedicles bilaterally at T10 and T11.  My nurse practitioner assisted in placement of the pedicle screws.  We then decorticated the transverse processes and laid a mixture of morcellized autograft and allograft out over these to perform intertransverse arthrodesis at T10-L2. We then placed slightly kyphotic rods into the multiaxial screw heads of the pedicle screws and locked  these in position with the locking caps and anti-torque device. We then checked our construct with AP and lateral fluoroscopy. Irrigated with copious amounts of Vashe followed by saline  solution. Inspected the decompression once again to assure adequate decompression, lined to the dura with Gelfoam,  and then we used a medium Hemovac drain through a separate stab incision and closed the muscle and the fascia with 0 Vicryl.  We placed 2 g of myriad fine morsels into the wound to help with wound healing.  Closed the subcutaneous tissues with 2-0 Vicryl and subcuticular tissues with 3-0 Vicryl. The skin was then covered with a Prevena external wound VAC.   the patient was awakened from general anesthesia and transported to the recovery room in stable condition. At the end of the procedure all sponge, needle and instrument counts were correct.   PLAN OF CARE: admit to inpatient  PATIENT DISPOSITION:  PACU - hemodynamically stable.   Delay start of Pharmacological VTE agent (>24hrs) due to surgical blood loss or risk of bleeding:  yes

## 2023-11-07 ENCOUNTER — Encounter (HOSPITAL_COMMUNITY): Payer: Self-pay | Admitting: Neurological Surgery

## 2023-11-07 LAB — GLUCOSE, CAPILLARY
Glucose-Capillary: 134 mg/dL — ABNORMAL HIGH (ref 70–99)
Glucose-Capillary: 132 mg/dL — ABNORMAL HIGH (ref 70–99)
Glucose-Capillary: 147 mg/dL — ABNORMAL HIGH (ref 70–99)
Glucose-Capillary: 108 mg/dL — ABNORMAL HIGH (ref 70–99)

## 2023-11-07 NOTE — Evaluation (Signed)
 Occupational Therapy Evaluation Patient Details Name: [name] MRN: [id number] DOB: October 27, 1970 Today's Date: 11/07/2023   History of Present Illness   Pt is a 53 yr old female who presented on 11/06/23 due to T12 fx. Pt s/p ORIF of T11-12 with thoracic laminectomy. PMH includes: anxiety, arthritis, chronic low back pain,  HLD, HTN, PTSD, Bipolar disorder, ORIF of R ankle fx in 2021, rib fxs, DDD,  DM, CAD, cardiac arrest 2022     Clinical Impressions Pt reported at PLOF they ambulating around the home with TLSO and would on/off use walker. Per pt they only had assist with medication management and donning brace. At this time she was able to complete LB dressing with min assist, sit to stand transfers with supervision and ambulated within the room with supervision and ambulation. Pt decline any further activity at this time. Pt would like OP PT with the return to home. Acute Occupational Therapy to follow.    If plan is discharge home, recommend the following:   A little help with walking and/or transfers;A little help with bathing/dressing/bathroom;Assistance with cooking/housework;Direct supervision/assist for medications management;Direct supervision/assist for financial management;Assist for transportation;Help with stairs or ramp for entrance     Functional Status Assessment   Patient has had a recent decline in their functional status and demonstrates the ability to make significant improvements in function in a reasonable and predictable amount of time.     Equipment Recommendations   None recommended by OT     Recommendations for Other Services         Precautions/Restrictions   Precautions Precautions: Fall;Back Precaution Booklet Issued: No Recall of Precautions/Restrictions: Impaired Precaution/Restrictions Comments: reviewed spinal precautions; hemovac; wound vac Required Braces or Orthoses: Spinal Brace Spinal Brace: Thoracolumbosacral orthotic;Applied  in sitting position Other Brace: TLSO Restrictions Weight Bearing Restrictions Per Provider Order: No     Mobility Bed Mobility Overal bed mobility: Needs Assistance Bed Mobility: Supine to Sit, Sit to Supine Rolling: Modified independent (Device/Increase time) Sidelying to sit: Modified independent (Device/Increase time) Supine to sit: Modified independent (Device/Increase time) Sit to supine: Modified independent (Device/Increase time) Sit to sidelying: Modified independent (Device/Increase time) General bed mobility comments: Pt needed increase in time and cued about log roll prior to session on how to complete and with increase in time was able to complete    Transfers Overall transfer level: Needs assistance Equipment used: Rolling walker (2 wheels) Transfers: Sit to/from Stand Sit to Stand: Supervision                  Balance Overall balance assessment: Needs assistance Sitting-balance support: No upper extremity supported, Feet supported Sitting balance-Leahy Scale: Good     Standing balance support: Bilateral upper extremity supported, During functional activity, Reliant on assistive device for balance Standing balance-Leahy Scale: Poor Standing balance comment: reliant on RW                           ADL either performed or assessed with clinical judgement   ADL Overall ADL's : Needs assistance/impaired Eating/Feeding: Independent;Sitting   Grooming: Wash/dry hands;Wash/dry face;Supervision/safety;Contact guard assist;Standing   Upper Body Bathing: Set up;Sitting   Lower Body Bathing: Minimal assistance;Sit to/from stand   Upper Body Dressing : Sitting;Set up   Lower Body Dressing: Minimal assistance;Sit to/from stand   Toilet Transfer: Contact guard assist;Cueing for safety           Functional mobility during ADLs: Supervision/safety;Rolling walker (2 wheels)  Vision Baseline Vision/History: 1 Wears glasses Ability to  See in Adequate Light: 0 Adequate Patient Visual Report: No change from baseline Vision Assessment?: No apparent visual deficits     Perception Perception: Within Functional Limits       Praxis Praxis: WFL       Pertinent Vitals/Pain Pain Assessment Pain Assessment: 0-10 Pain Score: 5  Faces Pain Scale: No hurt Breathing: normal Negative Vocalization: none Facial Expression: smiling or inexpressive Body Language: relaxed Consolability: no need to console PAINAD Score: 0 Pain Location: back Pain Descriptors / Indicators: Discomfort, Grimacing, Operative site guarding Pain Intervention(s): Limited activity within patient's tolerance     Extremity/Trunk Assessment Upper Extremity Assessment Upper Extremity Assessment: Generalized weakness;Left hand dominant (Pt reported they can not reach above 90 degress due to sx pain at this time)   Lower Extremity Assessment Lower Extremity Assessment: Defer to PT evaluation LLE Deficits / Details: reports continued entire L leg diminished sensation post surgery as it was prior to surgery; MMT scores of 4 quads bil, 4+ anterior tibialis bil LLE Sensation: decreased light touch   Cervical / Trunk Assessment Cervical / Trunk Assessment: Back Surgery Cervical / Trunk Exceptions: chronic compression fxs   Communication Communication Communication: No apparent difficulties   Cognition Arousal: Alert Behavior During Therapy: Flat affect Cognition: History of cognitive impairments (anoxic brain injury)             OT - Cognition Comments: Pt AAOx4 and pleasant throughout session with noted cognitive deficits as described above                 Following commands: Impaired Following commands impaired: Follows one step commands with increased time     Cueing  General Comments   Cueing Techniques: Verbal cues;Tactile cues  educated about line management of vac   Exercises     Shoulder Instructions      Home Living  Family/patient expects to be discharged to:: Private residence Living Arrangements: Spouse/significant other Available Help at Discharge: Family;Available 24 hours/day Type of Home: House Home Access: Stairs to enter [employer] of Steps: 3-4 Entrance Stairs-Rails: Right Home Layout: One level     Bathroom Shower/Tub: Chief Strategy Officer: Standard (with riser) Bathroom Accessibility: Yes How Accessible: Accessible via walker Home Equipment: Rolling Walker (2 wheels);Shower seat;Toilet riser;BSC/3in1 (she got a new walker from last session)   Additional Comments: info carried over from recent prior entry 10/26/23      Prior Functioning/Environment Prior Level of Function : Independent/Modified Independent             Mobility Comments: uses RW for longer distance, wears back brace ADLs Comments: Ind to Mod I with ADLs except husband assists with back brace management. Pt reports husband assists with IALDs, including medication mangement and transportation.    OT Problem List: Decreased strength;Decreased activity tolerance;Impaired balance (sitting and/or standing);Pain;Decreased knowledge of precautions   OT Treatment/Interventions: Self-care/ADL training;Therapeutic exercise;Energy conservation;DME and/or AE instruction;Therapeutic activities;Cognitive remediation/compensation;Patient/family education;Balance training      OT Goals(Current goals can be found in the care plan section)   Acute Rehab OT Goals Patient Stated Goal: to wait one more day to go home OT Goal Formulation: With patient Time For Goal Achievement: 11/21/23 Potential to Achieve Goals: Good   OT Frequency:  Min 2X/week    Co-evaluation              AM-PAC OT 6 Clicks Daily Activity     Outcome Measure Help from another  person eating meals?: None Help from another person taking care of personal grooming?: None Help from another person toileting, which includes  using toliet, bedpan, or urinal?: A Little Help from another person bathing (including washing, rinsing, drying)?: A Little Help from another person to put on and taking off regular upper body clothing?: A Little Help from another person to put on and taking off regular lower body clothing?: A Little 6 Click Score: 20   End of Session Equipment Utilized During Treatment: Rolling walker (2 wheels) Nurse Communication: Mobility status  Activity Tolerance: Patient tolerated treatment well Patient left: in bed;with call bell/phone within reach  OT Visit Diagnosis: Unsteadiness on feet (R26.81);Muscle weakness (generalized) (M62.81);History of falling (Z91.81);Other symptoms and signs involving cognitive function;Pain Pain - part of body:  (sx site)                Time: 0721-0755 OT Time Calculation (min): 34 min Charges:  OT General Charges $OT Visit: 1 Visit OT Evaluation $OT Eval Low Complexity: 1 Low OT Treatments $Self Care/Home Management : 8-22 mins  Warrick POUR OTR/L  Acute Rehab Services  [phone] office number   Warrick Berber 11/07/2023, 9:58 AM

## 2023-11-07 NOTE — Evaluation (Addendum)
 Physical Therapy Evaluation Patient Details Name: [name] MRN: [id number] DOB: 05-19-1970 Today's Date: 11/07/2023  History of Present Illness  Pt is a 53 yr old female who presented on 11/06/23 due to T12 fx. Pt s/p ORIF of T11-12 with thoracic laminectomy. PMH includes: anxiety, arthritis, chronic low back pain,  HLD, HTN, PTSD, Bipolar disorder, ORIF of R ankle fx in 2021, rib fxs, DDD,  DM, CAD, cardiac arrest 2022   Clinical Impression  Pt presents with condition above and deficits mentioned below, see PT Problem List. Per recent prior entry 10/26/23, pt was mod I utilizing a RW for functional mobility, living with her husband in a 1-level house with 3-4 STE. She has 24/7 care available to her at d/c. Currently, the pt is reporting she had numbness in her L leg prior to surgery, which has been unchanged since surgery. She also displays some generalized lower extremity weakness bil along with deficits in cognition (unsure if baseline or not), balance, and activity tolerance. She was able to perform bed mobility and transfers with supervision and ambulate with a RW and navigate stairs with a handrail with CGA for safety this date. She could benefit from follow-up with HHPT to maximize her return to baseline and reduce her risk for falls. Will continue to follow acutely.         If plan is discharge home, recommend the following: Help with stairs or ramp for entrance;Assist for transportation;Assistance with cooking/housework;A little help with walking and/or transfers;Direct supervision/assist for medications management;A little help with bathing/dressing/bathroom;Direct supervision/assist for financial management;Supervision due to cognitive status   Can travel by private vehicle        Equipment Recommendations None recommended by PT  Recommendations for Other Services       Functional Status Assessment Patient has had a recent decline in their functional status and demonstrates the  ability to make significant improvements in function in a reasonable and predictable amount of time.     Precautions / Restrictions Precautions Precautions: Fall;Back Precaution Booklet Issued: No Recall of Precautions/Restrictions: Impaired Precaution/Restrictions Comments: reviewed spinal precautions; hemovac; wound vac Required Braces or Orthoses: Spinal Brace Spinal Brace: Thoracolumbosacral orthotic;Applied in sitting position Restrictions Weight Bearing Restrictions Per Provider Order: No      Mobility  Bed Mobility Overal bed mobility: Needs Assistance Bed Mobility: Supine to Sit, Sit to Supine     Supine to sit: Supervision Sit to supine: Supervision   General bed mobility comments: Cued pt to log roll then sit up and also to lean to side then roll back for sidelying <> sit transitions but pt opted to pivot on buttocks and transition supine <> sit L EOB instead, appearing to not twist at spine by pivoting on buttocks instead. Supervision for safety    Transfers Overall transfer level: Needs assistance Equipment used: Rolling walker (2 wheels) Transfers: Sit to/from Stand Sit to Stand: Supervision           General transfer comment: Supervision for safety standing from EOB, no LOB    Ambulation/Gait Ambulation/Gait assistance: Contact guard assist Gait Distance (Feet): 150 Feet Assistive device: Rolling walker (2 wheels) Gait Pattern/deviations: Step-through pattern, Decreased step length - right, Decreased step length - left, Decreased stride length Gait velocity: reduced Gait velocity interpretation: 1.31 - 2.62 ft/sec, indicative of limited community ambulator   General Gait Details: Pt ambulates with a step-through gait pattern with mildly reduced gait speed and step length, no LOB, CGA for safety  Stairs Stairs: Yes Stairs  assistance: Contact guard assist Stair Management: One rail Right, One rail Left, Step to pattern, Forwards Number of Stairs:  3 General stair comments: Ascends with R rail and descends with L. Pt needs cues to avoid twisting to reach rail with bil hands. Slow to descend. CGA for safety, no LOB  Wheelchair Mobility     Tilt Bed    Modified Rankin (Stroke Patients Only)       Balance Overall balance assessment: Needs assistance Sitting-balance support: No upper extremity supported, Feet supported Sitting balance-Leahy Scale: Good     Standing balance support: Bilateral upper extremity supported, During functional activity, Reliant on assistive device for balance Standing balance-Leahy Scale: Poor Standing balance comment: reliant on RW                             Pertinent Vitals/Pain Pain Assessment Pain Assessment: Faces Faces Pain Scale: Hurts little more Pain Location: back Pain Descriptors / Indicators: Discomfort, Grimacing, Operative site guarding Pain Intervention(s): Limited activity within patient's tolerance, Monitored during session, Repositioned    Home Living Family/patient expects to be discharged to:: Private residence Living Arrangements: Spouse/significant other Available Help at Discharge: Family;Available 24 hours/day Type of Home: House Home Access: Stairs to enter Entrance Stairs-Rails: Right Entrance Stairs-Number of Steps: 3-4   Home Layout: One level Home Equipment: Agricultural consultant (2 wheels);Shower seat;Toilet riser;BSC/3in1;Tub bench (she got a new walker from last session)      Prior Function Prior Level of Function : Independent/Modified Independent             Mobility Comments: uses RW for longer distance, wears back brace ADLs Comments: Ind to Mod I with ADLs except husband assists with back brace management. Pt reports husband assists with IALDs, including medication mangement and transportation.     Extremity/Trunk Assessment   Upper Extremity Assessment Upper Extremity Assessment: Generalized weakness;Left hand dominant (Pt reported they  can not reach above 90 degress due to sx pain at this time)    Lower Extremity Assessment Lower Extremity Assessment: Defer to PT evaluation LLE Deficits / Details: reports continued entire L leg diminished sensation post surgery as it was prior to surgery; MMT scores of 4 quads bil, 4+ anterior tibialis bil LLE Sensation: decreased light touch    Cervical / Trunk Assessment Cervical / Trunk Assessment: Back Surgery Cervical / Trunk Exceptions: chronic compression fxs  Communication   Communication Communication: No apparent difficulties    Cognition Arousal: Alert Behavior During Therapy: Flat affect   PT - Cognitive impairments: Problem solving, Sequencing, Safety/Judgement, Memory, Attention                       PT - Cognition Comments: Unsure if baseline or not, but pt demonstrates deficits in attention and memory, needing repeated cues to direct her throughout the session even though had just directed/cued her moments earlier. Following commands: Impaired Following commands impaired: Follows one step commands with increased time     Cueing Cueing Techniques: Verbal cues, Tactile cues     General Comments General comments (skin integrity, edema, etc.): educated about line management of vac    Exercises     Assessment/Plan    PT Assessment Patient needs continued PT services  PT Problem List Decreased strength;Decreased cognition;Obesity;Decreased activity tolerance;Decreased safety awareness;Decreased balance;Decreased mobility;Decreased coordination;Decreased knowledge of precautions;Pain       PT Treatment Interventions DME instruction;Patient/family education;Gait training;Stair training;Functional mobility training;Therapeutic activities;Therapeutic exercise;Balance training;Neuromuscular  re-education;Cognitive remediation    PT Goals (Current goals can be found in the Care Plan section)  Acute Rehab PT Goals Patient Stated Goal: to go home  tomorrow PT Goal Formulation: With patient Time For Goal Achievement: 11/14/23 Potential to Achieve Goals: Good    Frequency Min 5X/week     Co-evaluation               AM-PAC PT 6 Clicks Mobility  Outcome Measure Help needed turning from your back to your side while in a flat bed without using bedrails?: A Little Help needed moving from lying on your back to sitting on the side of a flat bed without using bedrails?: A Little Help needed moving to and from a bed to a chair (including a wheelchair)?: A Little Help needed standing up from a chair using your arms (e.g., wheelchair or bedside chair)?: A Little Help needed to walk in hospital room?: A Little Help needed climbing 3-5 steps with a railing? : A Little 6 Click Score: 18    End of Session Equipment Utilized During Treatment: Gait belt;Back brace Activity Tolerance: Patient tolerated treatment well Patient left: in bed;with call bell/phone within reach Nurse Communication: Mobility status PT Visit Diagnosis: Other abnormalities of gait and mobility (R26.89);Muscle weakness (generalized) (M62.81);Pain;Unsteadiness on feet (R26.81) Pain - Right/Left:  (back) Pain - part of body:  (back)    Time: 9173-9161 PT Time Calculation (min) (ACUTE ONLY): 12 min   Charges:   PT Evaluation $PT Eval Moderate Complexity: 1 Mod   PT General Charges $$ ACUTE PT VISIT: 1 Visit         Theo Ferretti, PT, DPT Acute Rehabilitation Services  Office: [phone]   Theo CHRISTELLA Ferretti 11/07/2023, 11:15 AM

## 2023-11-07 NOTE — Progress Notes (Signed)
 Patient ID: [name], female   DOB: 1971-01-29, 53 y.o.   MRN: [id number] She looks great on postop day 1.  She has walked down the hall.  She is working with occupational therapy now.  She has good strength in her legs.  Continue therapy today.  Continue to mobilize.  Hopefully home tomorrow.

## 2023-11-08 LAB — GLUCOSE, CAPILLARY: Glucose-Capillary: 105 mg/dL — ABNORMAL HIGH (ref 70–99)

## 2023-11-08 NOTE — TOC Initial Note (Signed)
 Transition of Care [facility]) - Initial/Assessment Note    Patient Details  Name: [name] MRN: [id number] Date of Birth: 1971/04/03  Transition of Care [facility]) CM/SW Contact:    Nola Devere Hands, RN Phone Number: 11/08/2023, 8:18 AM  Clinical Narrative:                 Patient is a 53 yr old female s/p Laminectomy, Case Manager confirmed with patient that she has RW, 3in1, tub bench, shower seat and toilet riser. Will have family support at discharge. Patient preoperatively setup with [facility], no further needs identified.   Expected Discharge Plan: Home w Home Health Services Barriers to Discharge: No Barriers Identified   Patient Goals and CMS Choice     Choice offered to / list presented to : Patient (preoperatively setup)      Expected Discharge Plan and Services   Discharge Planning Services: CM Consult Post Acute Care Choice: Home Health Living arrangements for the past 2 months: Single Family Home Expected Discharge Date: 11/08/23               DME Arranged: N/A DME Agency: NA       HH Arranged: PT, OT HH Agency: [facility]        Prior Living Arrangements/Services Living arrangements for the past 2 months: Single Family Home Lives with:: Spouse Patient language and need for interpreter reviewed:: Yes Do you feel safe going back to the place where you live?: Yes      Need for Family Participation in Patient Care: Yes (Comment) Care giver support system in place?: Yes (comment) Current home services: DME Criminal Activity/Legal Involvement Pertinent to Current Situation/Hospitalization: No - Comment as needed  Activities of Daily Living      Permission Sought/Granted                  Emotional Assessment   Attitude/Demeanor/Rapport: Gracious   Orientation: : Oriented to Self, Oriented to Place, Oriented to  Time, Oriented to Situation Alcohol  / Substance Use: Not Applicable Psych Involvement: No (comment)  Admission  diagnosis:  Closed wedge compression fracture of T12 vertebra, initial encounter (HCC) [S22.080A] Status post thoracic spinal fusion [Z98.1] Patient Active Problem List   Diagnosis Date Noted   Status post thoracic spinal fusion 11/06/2023   Valvular heart disease 10/31/2023   Encephalopathy 10/24/2023   AKI (acute kidney injury) (HCC) 10/08/2023   Hypocalcemia 10/08/2023   Hyperproteinemia 10/08/2023   Positive urine drug screen 07/19/2022   Cyclic vomiting syndrome 07/18/2022   borderline prolonged QT interval 07/18/2022   Dysuria 07/18/2022   Mixed diabetic hyperlipidemia associated with type 2 diabetes mellitus (HCC) 06/27/2022   Compression fracture of fourth lumbar vertebra (HCC) 06/25/2022   RLQ abdominal pain 05/23/2022   Cyclical vomiting with nausea 05/21/2022   Colitis 03/22/2022   Acute colitis 03/22/2022   Dehydration 03/22/2022   Multiple rib fractures 02/03/2022   Barrett's esophagus    Cyclical vomiting syndrome 09/18/2021   Chronic pain syndrome 09/18/2021   Class 2 obesity due to excess calories with body mass index (BMI) of 37.0 to 37.9 in adult 09/18/2021   Hypothyroidism 09/18/2021   Starvation ketoacidosis 08/27/2021   Viral gastroenteritis 08/27/2021   Intractable vomiting 08/02/2021   Aortic atherosclerosis (HCC) 08/02/2021   LFT elevation 08/02/2021   Hyperbilirubinemia 08/02/2021   Hypoalbuminemia 08/02/2021   Type 2 diabetes mellitus without complication, without long-term current use of insulin  (HCC) 08/02/2021   Anoxic brain damage (HCC)  08/16/2020   History of depression    Sleep disturbance    Anxiety state    N&V (nausea and vomiting)    Substance abuse (HCC)    Anemia of chronic disease    Acute metabolic encephalopathy 06/23/2020   Hematochezia    Acute blood loss anemia    Acute respiratory failure with hypoxia (HCC)    Non-traumatic rhabdomyolysis    Cardiac arrest (HCC) 06/08/2020   Fracture of ankle, bimalleolar, right, closed  03/28/2020   Iron  deficiency 05/05/2019   Symptomatic anemia 05/05/2019   Nausea & vomiting 12/26/2018   Essential hypertension 12/26/2018   ARF (acute renal failure) (HCC) 12/26/2018   Intractable nausea and vomiting 11/28/2018   Bipolar affective disorder (HCC) 11/28/2018   Gastroenteritis 11/19/2015   Hypomagnesemia    C. difficile colitis    SIRS (systemic inflammatory response syndrome) (HCC) 01/11/2015   Cellulitis 01/03/2015   Opiate withdrawal (HCC) 12/18/2014   Depression with anxiety 12/17/2014   Herpes simplex virus type 1 (HSV-1) dermatitis    Sacral fracture (HCC) 12/12/2014   Fall at home, initial encounter 12/12/2014   Chronic lower back pain 12/12/2014   Sacral fracture, closed (HCC) 12/12/2014   Nausea with vomiting    Intractable abdominal pain 10/15/2014   Abdominal pain 10/15/2014   Hypokalemia 09/04/2014   Sepsis (HCC) 09/01/2014   Abdominal pain, generalized 09/01/2014   PTSD (post-traumatic stress disorder) 09/01/2014   Endometriosis 09/01/2014   Inappropriate sinus tachycardia (HCC) 09/01/2014   Lactic acidosis 09/01/2014   Sepsis secondary to UTI (HCC) 09/01/2014   PCP:  [name], PA-C Pharmacy:   [facility] [address] [place] - [address] [facility] [place] [address] Phone: [phone] Fax: [phone]  [id number] - [place], [place] - [address] AT [facility] [address] [place] [address] Phone: [phone] Fax: [phone]  [place] [facility] [address] [place] [address] Phone: [phone] Fax: [phone]  [place] [facility] [address] [place] [address] Phone: [phone] Fax: [phone]     Social Drivers of Health (SDOH) Social History: SDOH Screenings   Food Insecurity: No Food Insecurity (10/25/2023)  Housing: Low Risk  (10/25/2023)   Transportation Needs: No Transportation Needs (10/25/2023)  Utilities: Not At Risk (10/25/2023)  Depression (PHQ2-9): Low Risk  (08/16/2020)  Tobacco Use: Low Risk  (11/06/2023)   SDOH Interventions:     Readmission Risk Interventions    06/29/2022   12:45 PM 05/22/2022    9:25 AM 04/27/2022    9:05 AM  Readmission Risk Prevention Plan  Transportation Screening Complete Complete Complete  Medication Review (RN Care Manager) Complete Complete Complete  PCP or Specialist appointment within 3-5 days of discharge Complete Complete Complete  HRI or Home Care Consult Complete Complete Complete  SW Recovery Care/Counseling Consult Complete Complete Complete  Palliative Care Screening Not Applicable Not Applicable Not Applicable  Skilled Nursing Facility Not Applicable Not Applicable Not Applicable

## 2023-11-08 NOTE — Discharge Summary (Signed)
 Physician Discharge Summary  Patient ID: [name] MRN: [id number] DOB/AGE: 06/02/1970 53 y.o.  Admit date: 11/06/2023 Discharge date: 11/08/2023  Admission Diagnoses: T12 fracture with stenosis    Discharge Diagnoses: same   Discharged Condition: good  Hospital Course: The patient was admitted on 11/06/2023 and taken to the operating room where the patient underwent ORIF T12 frcature. The patient tolerated the procedure well and was taken to the recovery room and then to the floor in stable condition. The hospital course was routine. There were no complications. The wound remained clean dry and intact. Pt had appropriate back soreness. No complaints of leg pain or new N/T/W. The patient remained afebrile with stable vital signs, and tolerated a regular diet. The patient continued to increase activities, and pain was well controlled with oral pain medications.   Consults: None  Significant Diagnostic Studies:  Results for orders placed or performed during the hospital encounter of 11/06/23  Glucose, capillary   Collection Time: 11/06/23 10:08 AM  Result Value Ref Range   Glucose-Capillary 133 (H) 70 - 99 mg/dL  Glucose, capillary   Collection Time: 11/06/23 12:36 PM  Result Value Ref Range   Glucose-Capillary 116 (H) 70 - 99 mg/dL  Glucose, capillary   Collection Time: 11/06/23  2:25 PM  Result Value Ref Range   Glucose-Capillary 112 (H) 70 - 99 mg/dL  Glucose, capillary   Collection Time: 11/06/23  5:22 PM  Result Value Ref Range   Glucose-Capillary 159 (H) 70 - 99 mg/dL  Glucose, capillary   Collection Time: 11/06/23  9:07 PM  Result Value Ref Range   Glucose-Capillary 244 (H) 70 - 99 mg/dL   Comment 1 Notify RN    Comment 2 Document in Chart   Glucose, capillary   Collection Time: 11/07/23  6:19 AM  Result Value Ref Range   Glucose-Capillary 134 (H) 70 - 99 mg/dL   Comment 1 QC Due    Comment 2 Notify RN    Comment 3 Document in Chart   Glucose, capillary    Collection Time: 11/07/23 12:06 PM  Result Value Ref Range   Glucose-Capillary 132 (H) 70 - 99 mg/dL   Comment 1 Notify RN    Comment 2 Document in Chart   Glucose, capillary   Collection Time: 11/07/23  4:52 PM  Result Value Ref Range   Glucose-Capillary 147 (H) 70 - 99 mg/dL  Glucose, capillary   Collection Time: 11/07/23  9:16 PM  Result Value Ref Range   Glucose-Capillary 108 (H) 70 - 99 mg/dL  Glucose, capillary   Collection Time: 11/08/23  5:50 AM  Result Value Ref Range   Glucose-Capillary 105 (H) 70 - 99 mg/dL    DG THORACOLUMBAR SPINE Result Date: 11/06/2023 CLINICAL DATA:  T10-L2 fusion. EXAM: THORACOLUMBAR SPINE 1V COMPARISON:  Lumbar spine series 10/24/2023. FINDINGS: Three intraoperative fluoroscopic spot views of the thoracolumbar spine are submitted. Initial image shows pedicle screws at T10, T11, L1 and L2, flanking a T12 compression fracture. Subsequent images show placement of the posterior rods. IMPRESSION: Intraoperative visualization for T10-L2 posterior fusion, across a T12 compression fracture. Electronically Signed   By: Newell Eke M.D.   On: 11/06/2023 17:12   DG C-Arm 1-60 Min-No Report Result Date: 11/06/2023 Fluoroscopy was utilized by the requesting physician.  No radiographic interpretation.   DG C-Arm 1-60 Min-No Report Result Date: 11/06/2023 Fluoroscopy was utilized by the requesting physician.  No radiographic interpretation.   DG C-Arm 1-60 Min-No Report Result Date: 11/06/2023 Fluoroscopy  was utilized by the requesting physician.  No radiographic interpretation.   ECHOCARDIOGRAM COMPLETE Result Date: 10/31/2023    ECHOCARDIOGRAM REPORT   Patient Name:   [name] Date of Exam: 10/31/2023 Medical Rec #:  [id number]       Height:       64.5 in Accession #:    [id number]      Weight:       202.4 lb Date of Birth:  11/14/1970      BSA:          1.977 m Patient Age:    53 years        BP:           128/99 mmHg Patient Gender: F                HR:           96 bpm. Exam Location:  [address] Procedure: 2D Echo, Cardiac Doppler and Color Doppler (Both Spectral and Color            Flow Doppler were utilized during procedure). Indications:    Preoperative clearance Z01.810                 Sinus Tachycardia 147.11  History:        Patient has prior history of Echocardiogram examinations.                 Arrythmias:Tachycardia, Signs/Symptoms:Murmur; Risk                 Factors:Diabetes and Hypertension.  Sonographer:    Cherene Ravens Referring Phys: [id number] SCOTT T WEAVER IMPRESSIONS  1. Left ventricular ejection fraction, by estimation, is 60 to 65%. The left ventricle has normal function. The left ventricle has no regional wall motion abnormalities. Left ventricular diastolic parameters were normal.  2. Right ventricular systolic function is normal. The right ventricular size is normal.  3. The mitral valve is normal in structure. Mild to moderate mitral valve regurgitation. No evidence of mitral stenosis.  4. The aortic valve is normal in structure. Aortic valve regurgitation is mild. No aortic stenosis is present.  5. The inferior vena cava is normal in size with greater than 50% respiratory variability, suggesting right atrial pressure of 3 mmHg. FINDINGS  Left Ventricle: Left ventricular ejection fraction, by estimation, is 60 to 65%. The left ventricle has normal function. The left ventricle has no regional wall motion abnormalities. The left ventricular internal cavity size was normal in size. There is  no left ventricular hypertrophy. Left ventricular diastolic parameters were normal. Right Ventricle: The right ventricular size is normal. No increase in right ventricular wall thickness. Right ventricular systolic function is normal. Left Atrium: Left atrial size was normal in size. Right Atrium: Right atrial size was normal in size. Pericardium: There is no evidence of pericardial effusion. Mitral Valve: The mitral valve is normal in structure. Mild  to moderate mitral valve regurgitation. No evidence of mitral valve stenosis. MV peak gradient, 9.1 mmHg. The mean mitral valve gradient is 5.0 mmHg. Tricuspid Valve: The tricuspid valve is normal in structure. Tricuspid valve regurgitation is mild . No evidence of tricuspid stenosis. Aortic Valve: The aortic valve is normal in structure. Aortic valve regurgitation is mild. No aortic stenosis is present. Aortic valve peak gradient measures 6.0 mmHg. Pulmonic Valve: The pulmonic valve was normal in structure. Pulmonic valve regurgitation is mild. No evidence of pulmonic stenosis. Aorta: The aortic root is normal in size and  structure. Venous: The inferior vena cava is normal in size with greater than 50% respiratory variability, suggesting right atrial pressure of 3 mmHg. IAS/Shunts: No atrial level shunt detected by color flow Doppler.  LEFT VENTRICLE PLAX 2D LVIDd:         3.80 cm   Diastology LVIDs:         2.80 cm   LV e' medial:    9.14 cm/s LV PW:         1.10 cm   LV E/e' medial:  8.9 LV IVS:        1.00 cm   LV e' lateral:   8.05 cm/s LVOT diam:     1.90 cm   LV E/e' lateral: 10.2 LV SV:         54 LV SV Index:   27 LVOT Area:     2.84 cm  RIGHT VENTRICLE RV Basal diam:  2.80 cm RV Mid diam:    2.70 cm RV S prime:     20.70 cm/s TAPSE (M-mode): 2.5 cm RVSP:           34.6 mmHg LEFT ATRIUM             Index        RIGHT ATRIUM           Index LA diam:        3.00 cm 1.52 cm/m   RA Pressure: 3.00 mmHg LA Vol (A2C):   28.1 ml 14.21 ml/m  RA Area:     12.40 cm LA Vol (A4C):   33.4 ml 16.90 ml/m  RA Volume:   26.40 ml  13.35 ml/m LA Biplane Vol: 31.2 ml 15.78 ml/m  AORTIC VALVE AV Area (Vmax): 2.65 cm AV Vmax:        122.00 cm/s AV Peak Grad:   6.0 mmHg LVOT Vmax:      114.00 cm/s LVOT Vmean:     73.300 cm/s LVOT VTI:       0.189 m  AORTA Ao Root diam: 3.30 cm Ao Asc diam:  2.70 cm MITRAL VALVE                TRICUSPID VALVE MV Area (PHT): 3.74 cm     TR Peak grad:   31.6 mmHg MV Area VTI:   1.66 cm      TR Vmax:        281.00 cm/s MV Peak grad:  9.1 mmHg     Estimated RAP:  3.00 mmHg MV Mean grad:  5.0 mmHg     RVSP:           34.6 mmHg MV Vmax:       1.51 m/s MV Vmean:      103.0 cm/s   SHUNTS MV Decel Time: 203 msec     Systemic VTI:  0.19 m MR Peak grad: 101.2 mmHg    Systemic Diam: 1.90 cm MR Mean grad: 75.0 mmHg MR Vmax:      503.00 cm/s MR Vmean:     414.0 cm/s MV E velocity: 81.80 cm/s MV A velocity: 109.00 cm/s MV E/A ratio:  0.75 Aditya Sabharwal Electronically signed by Ria Commander Signature Date/Time: 10/31/2023/12:05:26 PM    Final    MR Lumbar Spine W Wo Contrast Result Date: 10/25/2023 CLINICAL DATA:  Compression fracture thoracic spine. EXAM: MRI THORACIC AND LUMBAR SPINE WITHOUT AND WITH CONTRAST TECHNIQUE: Multiplanar and multiecho pulse sequences of the thoracic and lumbar spine were obtained without and with  intravenous contrast. CONTRAST:  9mL GADAVIST  GADOBUTROL  1 MMOL/ML IV SOLN COMPARISON:  CT thoracic spine 09/11/2023. CT lumbar spine 07/27/2023. MRI lumbar spine 06/26/2022. FINDINGS: MRI THORACIC SPINE FINDINGS Alignment: Thoracic kyphosis maintained within the upper thoracic spine. There is straightening of the normal thoracic kyphosis within the mid and lower thoracic spine. No significant listhesis. Vertebrae: Similar appearance of compression fracture of T12 with up to 80% height loss centrally. Approximately 5-6 mm retropulsion at the superior endplate of T12 is similar to the thoracic CT on 09/11/2023. Edema throughout the T12 vertebral body. Additional compression deformity of T11 with mild height loss without significant edema, likely chronic. There are additional chronic deformities of the T1 through T4 superior endplates. Enhancement in the T12 vertebral body likely related to inflammatory changes from compression fracture. No suspicious osseous lesions. Cord: Compression of the thoracic cord at T12 with increased T2/stir signal concerning for edema versus myelomalacia. The  thoracic cord is otherwise normal in caliber and signal intensity. Paraspinal and other soft tissues: Paraspinal soft tissues are unremarkable. Disc levels: Retropulsion of fracture fragments at the superior endplate of T12 along with disc bulge at the T11-12 level which contacts and deforms the ventral thoracic cord. The cord is displaced dorsally within the thecal sac. There is cord compression and signal abnormality extending from the mid T11 level to the level of T12-L1. No additional significant disc bulge or additional areas of high-grade spinal canal stenosis. Facet arthrosis at multiple levels. There is mild foraminal stenosis noted at T9-10 and T10-11. Facet arthrosis along with retropulsion contributing to foraminal stenosis bilaterally at T11-12 and T12-L1. MRI LUMBAR SPINE FINDINGS Segmentation:  Standard. Alignment: Lumbar lordosis is maintained. No significant listhesis. Vertebrae: Irregularity along the L2 inferior endplate with linear fracture deformity and edema within the inferior aspect of L2 with mild enhancement. There is minimal height loss of L2 and minimal retropulsion along the inferior endplate. Chronic deformity of L3 with mild height loss centrally. Additional chronic deformity of L4 with moderate height loss anteriorly which is increased since 2024. There is a deformity of the L5 superior endplate with fracture line and edema and enhancement suggestive of additional compression fracture with approximately 10% height loss without significant retropulsion. Vertebral body heights otherwise maintained. Modic type 2 degenerative endplate changes at L5-S1. No suspicious osseous lesions. Conus medullaris: Extends to the L2-3 level and appears normal. Paraspinal and other soft tissues: Mild atrophy of the paraspinal musculature. Paraspinal soft tissues otherwise unremarkable. Disc levels: T12-L1: Small disc bulge. Mild facet arthrosis. No significant spinal canal stenosis. Facet arthrosis and  retropulsion of fracture fragments at T12 which results in mild bilateral foraminal stenosis. L1-2: No significant disc bulge. No significant spinal canal stenosis or foraminal stenosis. Mild facet arthrosis. L2-3: Disc desiccation. Small disc bulge. Mild facet arthrosis. No significant spinal canal or foraminal stenosis. L3-4: Disc desiccation. Small disc bulge. Chronic retropulsion at the superior endplate of L4. There is mild lateral recess narrowing. Mild facet arthrosis. Mild spinal canal stenosis. Mild bilateral foraminal stenosis new from prior. L4-5: Disc desiccation. Small central disc protrusion. Mild facet arthrosis and thickening of the ligamentum flavum. No significant spinal canal stenosis. Mild bilateral foraminal stenosis. L5-S1: Disc desiccation and moderate disc height loss. Small central disc protrusion. Mild facet arthrosis. No significant spinal canal stenosis. There is mild foraminal stenosis on the left. IMPRESSION: Subacute compression fracture of T12 with similar degree of height loss and retropulsion compared to CT on 09/11/2023. Retropulsed fracture fragments contact and deform the ventral  thoracic cord with cord compression and signal abnormality concerning for edema versus myelomalacia. Severe spinal canal stenosis noted at T11-12. Additional chronic compression deformities in the thoracic spine as above. Acute fracture along the L2 inferior endplate without significant height loss. Minimal retropulsion at the L2 inferior endplate. Additional acute compression fracture of L5 with approximately 10% height loss without retropulsion. Chronic compression deformities of L3 and L4 with interval height loss at L4 compared to 2024. Degenerative changes as above. Mild spinal canal stenosis at L3-4 is new from prior. No high-grade foraminal stenosis. Electronically Signed   By: Donnice Mania M.D.   On: 10/25/2023 12:07   MR THORACIC SPINE W WO CONTRAST Result Date: 10/25/2023 CLINICAL DATA:   Compression fracture thoracic spine. EXAM: MRI THORACIC AND LUMBAR SPINE WITHOUT AND WITH CONTRAST TECHNIQUE: Multiplanar and multiecho pulse sequences of the thoracic and lumbar spine were obtained without and with intravenous contrast. CONTRAST:  9mL GADAVIST  GADOBUTROL  1 MMOL/ML IV SOLN COMPARISON:  CT thoracic spine 09/11/2023. CT lumbar spine 07/27/2023. MRI lumbar spine 06/26/2022. FINDINGS: MRI THORACIC SPINE FINDINGS Alignment: Thoracic kyphosis maintained within the upper thoracic spine. There is straightening of the normal thoracic kyphosis within the mid and lower thoracic spine. No significant listhesis. Vertebrae: Similar appearance of compression fracture of T12 with up to 80% height loss centrally. Approximately 5-6 mm retropulsion at the superior endplate of T12 is similar to the thoracic CT on 09/11/2023. Edema throughout the T12 vertebral body. Additional compression deformity of T11 with mild height loss without significant edema, likely chronic. There are additional chronic deformities of the T1 through T4 superior endplates. Enhancement in the T12 vertebral body likely related to inflammatory changes from compression fracture. No suspicious osseous lesions. Cord: Compression of the thoracic cord at T12 with increased T2/stir signal concerning for edema versus myelomalacia. The thoracic cord is otherwise normal in caliber and signal intensity. Paraspinal and other soft tissues: Paraspinal soft tissues are unremarkable. Disc levels: Retropulsion of fracture fragments at the superior endplate of T12 along with disc bulge at the T11-12 level which contacts and deforms the ventral thoracic cord. The cord is displaced dorsally within the thecal sac. There is cord compression and signal abnormality extending from the mid T11 level to the level of T12-L1. No additional significant disc bulge or additional areas of high-grade spinal canal stenosis. Facet arthrosis at multiple levels. There is mild  foraminal stenosis noted at T9-10 and T10-11. Facet arthrosis along with retropulsion contributing to foraminal stenosis bilaterally at T11-12 and T12-L1. MRI LUMBAR SPINE FINDINGS Segmentation:  Standard. Alignment: Lumbar lordosis is maintained. No significant listhesis. Vertebrae: Irregularity along the L2 inferior endplate with linear fracture deformity and edema within the inferior aspect of L2 with mild enhancement. There is minimal height loss of L2 and minimal retropulsion along the inferior endplate. Chronic deformity of L3 with mild height loss centrally. Additional chronic deformity of L4 with moderate height loss anteriorly which is increased since 2024. There is a deformity of the L5 superior endplate with fracture line and edema and enhancement suggestive of additional compression fracture with approximately 10% height loss without significant retropulsion. Vertebral body heights otherwise maintained. Modic type 2 degenerative endplate changes at L5-S1. No suspicious osseous lesions. Conus medullaris: Extends to the L2-3 level and appears normal. Paraspinal and other soft tissues: Mild atrophy of the paraspinal musculature. Paraspinal soft tissues otherwise unremarkable. Disc levels: T12-L1: Small disc bulge. Mild facet arthrosis. No significant spinal canal stenosis. Facet arthrosis and retropulsion of fracture fragments at  T12 which results in mild bilateral foraminal stenosis. L1-2: No significant disc bulge. No significant spinal canal stenosis or foraminal stenosis. Mild facet arthrosis. L2-3: Disc desiccation. Small disc bulge. Mild facet arthrosis. No significant spinal canal or foraminal stenosis. L3-4: Disc desiccation. Small disc bulge. Chronic retropulsion at the superior endplate of L4. There is mild lateral recess narrowing. Mild facet arthrosis. Mild spinal canal stenosis. Mild bilateral foraminal stenosis new from prior. L4-5: Disc desiccation. Small central disc protrusion. Mild facet  arthrosis and thickening of the ligamentum flavum. No significant spinal canal stenosis. Mild bilateral foraminal stenosis. L5-S1: Disc desiccation and moderate disc height loss. Small central disc protrusion. Mild facet arthrosis. No significant spinal canal stenosis. There is mild foraminal stenosis on the left. IMPRESSION: Subacute compression fracture of T12 with similar degree of height loss and retropulsion compared to CT on 09/11/2023. Retropulsed fracture fragments contact and deform the ventral thoracic cord with cord compression and signal abnormality concerning for edema versus myelomalacia. Severe spinal canal stenosis noted at T11-12. Additional chronic compression deformities in the thoracic spine as above. Acute fracture along the L2 inferior endplate without significant height loss. Minimal retropulsion at the L2 inferior endplate. Additional acute compression fracture of L5 with approximately 10% height loss without retropulsion. Chronic compression deformities of L3 and L4 with interval height loss at L4 compared to 2024. Degenerative changes as above. Mild spinal canal stenosis at L3-4 is new from prior. No high-grade foraminal stenosis. Electronically Signed   By: Donnice Mania M.D.   On: 10/25/2023 12:07   CT Head Wo Contrast Result Date: 10/24/2023 CLINICAL DATA:  Altered mental status. EXAM: CT HEAD WITHOUT CONTRAST TECHNIQUE: Contiguous axial images were obtained from the base of the skull through the vertex without intravenous contrast. RADIATION DOSE REDUCTION: This exam was performed according to the departmental dose-optimization program which includes automated exposure control, adjustment of the mA and/or kV according to patient size and/or use of iterative reconstruction technique. COMPARISON:  CT head 06/25/2022. FINDINGS: Brain: No acute intracranial hemorrhage. No CT evidence of acute infarct. No edema, mass effect, or midline shift. The basilar cisterns are patent. There is mild  asymmetric prominence of the left sylvian fissure suggestive of mild frontotemporal volume loss similar to prior. Ventricles: The ventricles are normal. Vascular: No hyperdense vessel or unexpected calcification. Skull: No acute or aggressive finding. Orbits: Orbits are symmetric. Sinuses: The visualized paranasal sinuses are clear. Other: Mastoid air cells are clear. IMPRESSION: No CT evidence of acute intracranial abnormality. Electronically Signed   By: Donnice Mania M.D.   On: 10/24/2023 16:47   DG Lumbar Spine Complete Result Date: 10/24/2023 CLINICAL DATA:  back pain EXAM: LUMBAR SPINE - COMPLETE 4+ VIEW COMPARISON:  Sep 11, 2023, July 27, 2023 FINDINGS: Diffuse osteopenia. Five non rib-bearing lumbar type vertebral bodies. Straightening of the normal lumbar lordosis without spondylolisthesis. Similar appearance of the burst fracture involving T12. Unchanged chronic compression fracture of L4 with superior endplate concavity along L3. Subtle endplate concavity along the superior endplate of L5 with sclerosis. Moderate intervertebral disc height loss at L5-S1. no pars interarticularis defects. The soft tissues are otherwise unremarkable. IMPRESSION: 1. Diffuse osteopenia. Subtle endplate concavity along the superior endplate of L5 with underlying sclerosis. This could reflect imaging artifact versus a subtle acute compression fracture. Correlation with point tenderness recommended. 2. Similar appearance of the burst fracture of T12. Electronically Signed   By: Rogelia Myers M.D.   On: 10/24/2023 16:26   DG Chest 1 View Result Date:  10/24/2023 CLINICAL DATA:  back pain EXAM: CHEST  1 VIEW COMPARISON:  October 08, 2023 FINDINGS: Overlying metallic structure, possibly a external brace. Elevation of the right hemidiaphragm. Streaky atelectasis again noted in the right lung base. No new airspace consolidation, pleural effusion, or pneumothorax. No cardiomegaly.Partially visualized compression fracture in the  lower thoracic spine. IMPRESSION: No acute cardiopulmonary abnormality. Electronically Signed   By: Rogelia Myers M.D.   On: 10/24/2023 16:22    Antibiotics:  Anti-infectives (From admission, onward)    Start     Dose/Rate Route Frequency Ordered Stop   11/06/23 1545  ceFAZolin  (ANCEF ) IVPB 2g/100 mL premix        2 g 200 mL/hr over 30 Minutes Intravenous Every 8 hours 11/06/23 1542 11/07/23 0811   11/06/23 1015  ceFAZolin  (ANCEF ) IVPB 2g/100 mL premix        2 g 200 mL/hr over 30 Minutes Intravenous On call to O.R. 11/06/23 1003 11/06/23 1153       Discharge Exam: Blood pressure 114/84, pulse (!) 102, temperature 98.9 F (37.2 C), temperature source Oral, resp. rate 17, height 5' 4.5 (1.638 m), weight 89.4 kg, last menstrual period 06/03/2018, SpO2 95%. Neurologic: Grossly normal Wound vac in place  Discharge Medications:   Allergies as of 11/08/2023   No Known Allergies      Medication List     TAKE these medications    acetaminophen  650 MG CR tablet Commonly known as: TYLENOL  Take 1,300 mg by mouth every 8 (eight) hours as needed for pain.   ARIPiprazole  10 MG tablet Commonly known as: Abilify  Take 1 tablet (10 mg total) by mouth daily.   atorvastatin  20 MG tablet Commonly known as: LIPITOR Take 1 tablet (20 mg total) by mouth daily.   carvedilol  6.25 MG tablet Commonly known as: COREG  Take 1 tablet (6.25 mg total) by mouth 2 (two) times daily with a meal.   DULoxetine  60 MG capsule Commonly known as: CYMBALTA  Take 1 capsule (60 mg total) by mouth 2 (two) times daily.   ibuprofen  200 MG tablet Commonly known as: ADVIL  Take 400 mg by mouth daily as needed for moderate pain (pain score 4-6) or mild pain (pain score 1-3).   levothyroxine  50 MCG tablet Commonly known as: SYNTHROID  Take 50 mcg by mouth daily before breakfast.   lidocaine  5 % Commonly known as: LIDODERM  Place 1 patch onto the skin daily. Remove & Discard patch within 12 hours or as  directed by MD   metoCLOPramide  5 MG tablet Commonly known as: REGLAN  Take 1 tablet (5 mg total) by mouth every 8 (eight) hours as needed for nausea or vomiting.   Mounjaro  7.5 MG/0.5ML Pen Generic drug: tirzepatide  Inject 7.5 mg into the skin once a week.   multivitamin Tabs tablet Take 1 tablet by mouth at bedtime.   ondansetron  8 MG disintegrating tablet Commonly known as: ZOFRAN -ODT Take 1 tablet (8 mg total) by mouth daily as needed for nausea or vomiting.   oxyCODONE  15 MG immediate release tablet Commonly known as: ROXICODONE  Take 1 tablet (15 mg total) by mouth 4 (four) times daily as needed.   pantoprazole  40 MG tablet Commonly known as: PROTONIX  Take 1 tablet by mouth twice daily   prazosin  5 MG capsule Commonly known as: MINIPRESS  Take 1 capsule (5 mg total) by mouth at bedtime.   pregabalin  50 MG capsule Commonly known as: LYRICA  Take 50 mg by mouth 2 (two) times daily.   Prolia 60 MG/ML Sosy injection Generic  drug: denosumab Inject 60 mg into the skin every 6 (six) months.   tiZANidine  4 MG tablet Commonly known as: ZANAFLEX  Take 1 tablet (4 mg total) by mouth every 6 (six) hours as needed.   trazodone  300 MG tablet Commonly known as: DESYREL  Take 2 tablets (600 mg total) by mouth at bedtime.   Vitamin D  (Ergocalciferol ) 1.25 MG (50000 UNIT) Caps capsule Commonly known as: DRISDOL  Take 1 capsule (50,000 Units total) by mouth every 7 (seven) days.               Durable Medical Equipment  (From admission, onward)           Start     Ordered   11/06/23 1543  DME Walker rolling  Once       Question:  Patient needs a walker to treat with the following condition  Answer:  S/P lumbar fusion   11/06/23 1542   11/06/23 1543  DME 3 n 1  Once        11/06/23 1542              Discharge Care Instructions  (From admission, onward)           Start     Ordered   11/08/23 0000  Discharge wound care:       Comments: Remove sponge in  6-7 days   11/08/23 9275            Disposition: home   Final Dx: ORIF T12 fracture  Discharge Instructions     Call MD for:  difficulty breathing, headache or visual disturbances   Complete by: As directed    Call MD for:  persistant nausea and vomiting   Complete by: As directed    Call MD for:  redness, tenderness, or signs of infection (pain, swelling, redness, odor or green/yellow discharge around incision site)   Complete by: As directed    Call MD for:  severe uncontrolled pain   Complete by: As directed    Call MD for:  temperature >100.4   Complete by: As directed    Diet - low sodium heart healthy   Complete by: As directed    Discharge instructions   Complete by: As directed    Remove external wound vac in 6-7 days when the lights go off   Discharge wound care:   Complete by: As directed    Remove sponge in 6-7 days   Increase activity slowly   Complete by: As directed         Follow-up Information     Joshua Alm Hamilton, MD. Schedule an appointment as soon as possible for a visit in 2 week(s).   Specialty: Neurosurgery Contact information: [address] [place] [address] [phone]                  Signed: Alm GORMAN Joshua 11/08/2023, 7:24 AM

## 2023-11-08 NOTE — Care Management Important Message (Signed)
 Important Message  Patient Details  Name: [name] MRN: [id number] Date of Birth: 08-03-1970   Important Message Given:  Yes - Medicare IM     Claretta Deed 11/08/2023, 4:14 PM

## 2023-11-08 NOTE — Care Management Important Message (Addendum)
 Important Message  Patient Details  Name: [name] MRN: [id number] Date of Birth: 1971/02/03   Important Message Given:  Yes - Medicare IM  Patient left prior to IM delivery  will mail a copy to the patient home address   Claretta Deed 11/08/2023, 12:50 PM

## 2023-11-08 NOTE — Progress Notes (Signed)
 Occupational Therapy Treatment Patient Details Name: [name] MRN: [id number] DOB: October 25, 1970 Today's Date: 11/08/2023   History of present illness Pt is a 53 yr old female who presented on 11/06/23 due to T12 fx. Pt s/p ORIF of T11-12 with thoracic laminectomy. PMH includes: anxiety, arthritis, chronic low back pain,  HLD, HTN, PTSD, Bipolar disorder, ORIF of R ankle fx in 2021, rib fxs, DDD,  DM, CAD, cardiac arrest 2022   OT comments  Patient able to complete ADL with minimal cues for figure four, but no physical assist.  Up and walking in the room with generalized supervision.  Patient is scheduled for discharge, no OT is recommended for post acute given family support.  All questions answered and no further OT needed in the acute setting.        If plan is discharge home, recommend the following:  A little help with walking and/or transfers;A little help with bathing/dressing/bathroom;Assistance with cooking/housework;Direct supervision/assist for medications management;Direct supervision/assist for financial management;Assist for transportation;Help with stairs or ramp for entrance   Equipment Recommendations       Recommendations for Other Services      Precautions / Restrictions Precautions Precautions: Fall;Back Precaution Booklet Issued: Yes (comment) Recall of Precautions/Restrictions: Intact Precaution/Restrictions Comments: reviewed spinal precautions; hemovac Required Braces or Orthoses: Spinal Brace Spinal Brace: Thoracolumbosacral orthotic Other Brace: Did not apply for ADL Restrictions Weight Bearing Restrictions Per Provider Order: No       Mobility Bed Mobility Overal bed mobility: Needs Assistance Bed Mobility: Sidelying to Sit, Sit to Sidelying   Sidelying to sit: Modified independent (Device/Increase time)     Sit to sidelying: Supervision General bed mobility comments: VC's for log roll back to supine.    Transfers Overall transfer level:  Needs assistance Equipment used: Rolling walker (2 wheels) Transfers: Sit to/from Stand Sit to Stand: Supervision                 Balance Overall balance assessment: Needs assistance Sitting-balance support: Feet supported Sitting balance-Leahy Scale: Good     Standing balance support: Reliant on assistive device for balance Standing balance-Leahy Scale: Fair                             ADL either performed or assessed with clinical judgement   ADL Overall ADL's : Needs assistance/impaired                     Lower Body Dressing: Supervision/safety;Sit to/from stand   Toilet Transfer: Supervision/safety;Rolling walker (2 wheels)                  Extremity/Trunk Assessment Upper Extremity Assessment Upper Extremity Assessment: Overall WFL for tasks assessed   Lower Extremity Assessment Lower Extremity Assessment: Defer to PT evaluation   Cervical / Trunk Assessment Cervical / Trunk Assessment: Back Surgery    Vision Patient Visual Report: No change from baseline Vision Assessment?: No apparent visual deficits   Perception Perception Perception: Within Functional Limits   Praxis Praxis Praxis: WFL   Communication Communication Communication: No apparent difficulties   Cognition Arousal: Alert Behavior During Therapy: Flat affect Cognition: History of cognitive impairments                               Following commands: Intact Following commands impaired: Only follows one step commands consistently      Cueing  Cueing Techniques: Verbal cues, Tactile cues  Exercises      Shoulder Instructions       General Comments      Pertinent Vitals/ Pain       Pain Assessment Pain Assessment: Faces Faces Pain Scale: Hurts little more Pain Location: back Pain Descriptors / Indicators: Discomfort, Grimacing, Operative site guarding Pain Intervention(s): Monitored during session                                                           Frequency           Progress Toward Goals  OT Goals(current goals can now be found in the care plan section)  Progress towards OT goals: Progressing toward goals;Goals met/education completed, patient discharged from OT  Acute Rehab OT Goals OT Goal Formulation: With patient Time For Goal Achievement: 11/21/23 Potential to Achieve Goals: Good  Plan      Co-evaluation                 AM-PAC OT 6 Clicks Daily Activity     Outcome Measure   Help from another person eating meals?: None Help from another person taking care of personal grooming?: None Help from another person toileting, which includes using toliet, bedpan, or urinal?: A Little Help from another person bathing (including washing, rinsing, drying)?: A Little Help from another person to put on and taking off regular upper body clothing?: None Help from another person to put on and taking off regular lower body clothing?: A Little 6 Click Score: 21    End of Session Equipment Utilized During Treatment: Rolling walker (2 wheels)  OT Visit Diagnosis: Pain   Activity Tolerance Patient tolerated treatment well   Patient Left in bed;with call bell/phone within reach   Nurse Communication          Time: 9180-9165 OT Time Calculation (min): 15 min  Charges: OT General Charges $OT Visit: 1 Visit OT Treatments $Self Care/Home Management : 8-22 mins  11/08/2023  RP, OTR/L  Acute Rehabilitation Services  Office:  [phone]   [name] 11/08/2023, 8:53 AM

## 2023-11-08 NOTE — Progress Notes (Signed)
 Patient alert and oriented, void, ambulate. Surgical site clean and dry no sign of infection. D/c instructions explain and given, gauze and tape given to the patient. All questions answered.

## 2023-11-08 NOTE — Anesthesia Postprocedure Evaluation (Signed)
 Anesthesia Post Note  Patient: [name]  Procedure(s) Performed: LAMINECTOMY WITH POSTERIOR LATERAL ARTHRODESIS THORACIC TEN- THORACIC ELEVEN, THORACIC ELEVEN- THORACIC TWELVE, THORACIC TWELVE- LUMBAR ONE- LUMBAR TWO, LAMINECTOMY THORACIC TWELVE (Back)     Patient location during evaluation: PACU Anesthesia Type: General Level of consciousness: awake and alert Pain management: pain level controlled Vital Signs Assessment: post-procedure vital signs reviewed and stable Respiratory status: spontaneous breathing, nonlabored ventilation, respiratory function stable and patient connected to nasal cannula oxygen  Cardiovascular status: blood pressure returned to baseline and stable Postop Assessment: no apparent nausea or vomiting Anesthetic complications: no   No notable events documented.  Last Vitals:  Vitals:   11/07/23 1936 11/08/23 0059  BP: 96/63 104/65  Pulse: 81 (!) 102  Resp: 18 17  Temp: 37.3 C 37 C  SpO2: 97% 96%    Last Pain:  Vitals:   11/08/23 0130  TempSrc:   PainSc: 7                  Lynwood MARLA Cornea

## 2023-11-14 ENCOUNTER — Other Ambulatory Visit (HOSPITAL_COMMUNITY): Payer: Self-pay

## 2023-11-14 ENCOUNTER — Other Ambulatory Visit: Payer: Self-pay

## 2023-11-14 MED ORDER — CARVEDILOL 6.25 MG PO TABS
6.2500 mg | ORAL_TABLET | Freq: Two times a day (BID) | ORAL | 1 refills | Status: DC
  Filled 2023-11-14 – 2023-11-24 (×2): qty 180, 90d supply, fill #0

## 2023-11-15 ENCOUNTER — Other Ambulatory Visit (HOSPITAL_COMMUNITY): Payer: Self-pay

## 2023-11-15 MED ORDER — DULOXETINE HCL 60 MG PO CPEP
60.0000 mg | ORAL_CAPSULE | Freq: Two times a day (BID) | ORAL | 1 refills | Status: DC
  Filled 2023-11-15 – 2023-11-29 (×3): qty 180, 90d supply, fill #0

## 2023-11-15 MED ORDER — TRAZODONE HCL 300 MG PO TABS
600.0000 mg | ORAL_TABLET | Freq: Every day | ORAL | 0 refills | Status: DC
  Filled 2023-11-15 – 2023-11-19 (×2): qty 180, 90d supply, fill #0

## 2023-11-15 MED ORDER — CLONAZEPAM 0.5 MG PO TABS
0.5000 mg | ORAL_TABLET | Freq: Every day | ORAL | 2 refills | Status: DC | PRN
  Filled ????-??-??: fill #0

## 2023-11-15 MED ORDER — PRAZOSIN HCL 5 MG PO CAPS
5.0000 mg | ORAL_CAPSULE | Freq: Every day | ORAL | 2 refills | Status: DC
  Filled 2023-11-15: qty 90, 90d supply, fill #0

## 2023-11-15 MED ORDER — ARIPIPRAZOLE 10 MG PO TABS
10.0000 mg | ORAL_TABLET | Freq: Every day | ORAL | 1 refills | Status: DC
  Filled 2023-11-15 – 2023-11-24 (×2): qty 90, 90d supply, fill #0

## 2023-11-19 ENCOUNTER — Other Ambulatory Visit (HOSPITAL_COMMUNITY): Payer: Self-pay

## 2023-11-19 MED ORDER — OXYCODONE HCL 15 MG PO TABS
15.0000 mg | ORAL_TABLET | Freq: Four times a day (QID) | ORAL | 0 refills | Status: DC | PRN
  Filled 2023-11-19 – 2023-11-24 (×2): qty 40, 10d supply, fill #0

## 2023-11-19 MED ORDER — METHOCARBAMOL 750 MG PO TABS
750.0000 mg | ORAL_TABLET | Freq: Three times a day (TID) | ORAL | 1 refills | Status: DC | PRN
  Filled 2023-11-19: qty 60, 20d supply, fill #0
  Filled 2023-11-24: qty 60, 20d supply, fill #1

## 2023-11-20 ENCOUNTER — Other Ambulatory Visit (HOSPITAL_COMMUNITY): Payer: Self-pay

## 2023-11-21 ENCOUNTER — Other Ambulatory Visit: Payer: Self-pay

## 2023-11-21 ENCOUNTER — Other Ambulatory Visit (HOSPITAL_COMMUNITY): Payer: Self-pay

## 2023-11-21 ENCOUNTER — Emergency Department (HOSPITAL_COMMUNITY)

## 2023-11-21 ENCOUNTER — Encounter (HOSPITAL_COMMUNITY): Payer: Self-pay

## 2023-11-21 ENCOUNTER — Emergency Department (HOSPITAL_COMMUNITY)
Admission: EM | Admit: 2023-11-21 | Discharge: 2023-11-21 | Disposition: A | Discharge status: Home / Self Care | Attending: Emergency Medicine | Admitting: Emergency Medicine

## 2023-11-21 DIAGNOSIS — R079 Chest pain, unspecified: Secondary | ICD-10-CM | POA: Insufficient documentation

## 2023-11-21 DIAGNOSIS — I2510 Atherosclerotic heart disease of native coronary artery without angina pectoris: Secondary | ICD-10-CM | POA: Diagnosis not present

## 2023-11-21 LAB — BASIC METABOLIC PANEL WITH GFR
Anion gap: 8 (ref 5–15)
BUN: 6 mg/dL (ref 6–20)
CO2: 22 mmol/L (ref 22–32)
Calcium: 7.8 mg/dL — ABNORMAL LOW (ref 8.9–10.3)
Chloride: 106 mmol/L (ref 98–111)
Creatinine, Ser: 0.6 mg/dL (ref 0.44–1.00)
GFR, Estimated: >60 mL/min (ref >60)
Glucose, Bld: 118 mg/dL — ABNORMAL HIGH (ref 70–99)
Potassium: 4.2 mmol/L (ref 3.5–5.1)
Sodium: 136 mmol/L (ref 135–145)

## 2023-11-21 LAB — CBC
HCT: 33.5 % — ABNORMAL LOW (ref 36.0–46.0)
Hemoglobin: 10.5 g/dL — ABNORMAL LOW (ref 12.0–15.0)
MCH: 26.4 pg (ref 26.0–34.0)
MCHC: 31.3 g/dL (ref 30.0–36.0)
MCV: 84.4 fL (ref 80.0–100.0)
Platelets: 420 K/uL — ABNORMAL HIGH (ref 150–400)
RBC: 3.97 MIL/uL (ref 3.87–5.11)
RDW: 16.2 % — ABNORMAL HIGH (ref 11.5–15.5)
WBC: 13.2 K/uL — ABNORMAL HIGH (ref 4.0–10.5)
nRBC: 0 % (ref 0.0–0.2)

## 2023-11-21 LAB — TROPONIN I (HIGH SENSITIVITY)
Troponin I (High Sensitivity): <2 ng/L (ref <18)
Troponin I (High Sensitivity): <2 ng/L (ref <18)

## 2023-11-21 MED ORDER — HYDROMORPHONE HCL 1 MG/ML IJ SOLN
1.0000 mg | Freq: Once | INTRAMUSCULAR | Status: AC
  Administered 2023-11-21: 1 mg via INTRAVENOUS
  Filled 2023-11-21: qty 1

## 2023-11-21 MED ORDER — ONDANSETRON HCL 4 MG/2ML IJ SOLN
4.0000 mg | Freq: Once | INTRAMUSCULAR | Status: AC
  Administered 2023-11-21: 4 mg via INTRAVENOUS
  Filled 2023-11-21: qty 2

## 2023-11-21 MED ORDER — IOHEXOL 350 MG/ML SOLN
75.0000 mL | Freq: Once | INTRAVENOUS | Status: AC | PRN
  Administered 2023-11-21: 75 mL via INTRAVENOUS

## 2023-11-21 NOTE — ED Provider Notes (Signed)
 [place] EMERGENCY DEPARTMENT AT [facility] Provider Note   CSN: [id number] Arrival date & time: 11/21/23  0133     Patient presents with: Chest Pain   [name] is a 53 y.o. female.   Patient presents to the emergency department for evaluation of chest pain.  Patient reports diffuse anterior chest pain.  She recently underwent spinal surgery.  Patient denies any associated shortness of breath.  Patient does report back pain which is chronic in nature for her, unchanged.  She has been using oxycodone  at home for this.       Prior to Admission medications   Medication Sig Start Date End Date Taking? Authorizing Provider  acetaminophen  (TYLENOL ) 650 MG CR tablet Take 1,300 mg by mouth every 8 (eight) hours as needed for pain.    [provider]  ARIPiprazole  (ABILIFY ) 10 MG tablet Take 1 tablet (10 mg total) by mouth daily. 10/16/23     ARIPiprazole  (ABILIFY ) 10 MG tablet Take 1 tablet (10 mg total) by mouth daily. 11/15/23     atorvastatin  (LIPITOR) 20 MG tablet Take 1 tablet (20 mg total) by mouth daily. 09/28/23     carvedilol  (COREG ) 6.25 MG tablet Take 1 tablet (6.25 mg total) by mouth 2 (two) times daily with a meal. 10/26/23   Kathrin Mignon DASEN, MD  carvedilol  (COREG ) 6.25 MG tablet Take 1 tablet (6.25 mg total) by mouth 2 (two) times daily. 11/14/23     DULoxetine  (CYMBALTA ) 60 MG capsule Take 1 capsule (60 mg total) by mouth 2 (two) times daily. 10/16/23     DULoxetine  (CYMBALTA ) 60 MG capsule Take 1 capsule (60 mg total) by mouth 2 (two) times daily. 11/15/23     ibuprofen  (ADVIL ) 200 MG tablet Take 400 mg by mouth daily as needed for moderate pain (pain score 4-6) or mild pain (pain score 1-3).    [provider]  levothyroxine  (SYNTHROID ) 50 MCG tablet Take 50 mcg by mouth daily before breakfast.    [provider]  lidocaine  (LIDODERM ) 5 % Place 1 patch onto the skin daily. Remove & Discard patch within 12 hours or as directed by MD 10/11/23    Rai, Nydia POUR, MD  methocarbamol  (ROBAXIN ) 750 MG tablet Take 1 tablet (750 mg total) by mouth 3 (three) times daily as needed. 11/19/23     metoCLOPramide  (REGLAN ) 5 MG tablet Take 1 tablet (5 mg total) by mouth every 8 (eight) hours as needed for nausea or vomiting. 10/11/23   Rai, Nydia POUR, MD  multivitamin (RENA-VIT) TABS tablet Take 1 tablet by mouth at bedtime. 08/01/20   Tobie Burgess [place], MD  ondansetron  (ZOFRAN -ODT) 8 MG disintegrating tablet Take 1 tablet (8 mg total) by mouth daily as needed for nausea or vomiting. 10/11/23   Rai, Nydia POUR, MD  oxyCODONE  (ROXICODONE ) 15 MG immediate release tablet Take 1 tablet (15 mg total) by mouth every 6 (six) hours as needed. 11/19/23     pantoprazole  (PROTONIX ) 40 MG tablet Take 1 tablet by mouth twice daily 09/21/22   Kerman Vina HERO, NP  prazosin  (MINIPRESS ) 5 MG capsule Take 1 capsule (5 mg total) by mouth at bedtime. 10/16/23     prazosin  (MINIPRESS ) 5 MG capsule Take 1 capsule (5 mg total) by mouth at bedtime. 11/15/23     pregabalin  (LYRICA ) 50 MG capsule Take 50 mg by mouth 2 (two) times daily. 07/10/23   [provider]  PROLIA 60 MG/ML SOSY injection Inject 60 mg into  the skin every 6 (six) months. 09/30/23   [provider]  tirzepatide  (MOUNJARO ) 7.5 MG/0.5ML Pen Inject 7.5 mg into the skin once a week. 10/22/23     tiZANidine  (ZANAFLEX ) 4 MG tablet Take 1 tablet (4 mg total) by mouth every 6 (six) hours as needed. 10/22/23     trazodone  (DESYREL ) 300 MG tablet Take 2 tablets (600 mg total) by mouth at bedtime. 10/16/23     trazodone  (DESYREL ) 300 MG tablet Take 2 tablets (600 mg total) by mouth at bedtime. 11/15/23     Vitamin D , Ergocalciferol , (DRISDOL ) 1.25 MG (50000 UNIT) CAPS capsule Take 1 capsule (50,000 Units total) by mouth every 7 (seven) days. 11/01/23   Gonfa, Taye T, MD    Allergies: Patient has no known allergies.    Review of Systems  Updated Vital Signs BP 116/81   Pulse 74   Temp 98.4 F (36.9 C) (Oral)    Resp 16   Ht 5' 4.5 (1.638 m)   Wt 89.4 kg   LMP 06/03/2018 (Approximate) Comment: neg hcg 05/10/20  SpO2 99%   BMI 33.29 kg/m   Physical Exam Vitals and nursing note reviewed.  Constitutional:      General: She is not in acute distress.    Appearance: She is well-developed.  HENT:     Head: Normocephalic and atraumatic.     Mouth/Throat:     Mouth: Mucous membranes are moist.  Eyes:     General: Vision grossly intact. Gaze aligned appropriately.     Extraocular Movements: Extraocular movements intact.     Conjunctiva/sclera: Conjunctivae normal.  Cardiovascular:     Rate and Rhythm: Normal rate and regular rhythm.     Pulses: Normal pulses.     Heart sounds: Normal heart sounds, S1 normal and S2 normal. No murmur heard.    No friction rub. No gallop.  Pulmonary:     Effort: Pulmonary effort is normal. No respiratory distress.     Breath sounds: Normal breath sounds.  Abdominal:     General: Bowel sounds are normal.     Palpations: Abdomen is soft.     Tenderness: There is no abdominal tenderness. There is no guarding or rebound.     Hernia: No hernia is present.  Musculoskeletal:        General: No swelling.     Cervical back: Full passive range of motion without pain, normal range of motion and neck supple. No spinous process tenderness or muscular tenderness. Normal range of motion.     Right lower leg: No edema.     Left lower leg: No edema.  Skin:    General: Skin is warm and dry.     Capillary Refill: Capillary refill takes less than 2 seconds.     Findings: No ecchymosis, erythema, rash or wound.  Neurological:     General: No focal deficit present.     Mental Status: She is alert and oriented to person, place, and time.     GCS: GCS eye subscore is 4. GCS verbal subscore is 5. GCS motor subscore is 6.     Cranial Nerves: Cranial nerves 2-12 are intact.     Sensory: Sensation is intact.     Motor: Motor function is intact.     Coordination: Coordination is  intact.  Psychiatric:        Attention and Perception: Attention normal.        Mood and Affect: Mood normal.  Speech: Speech normal.        Behavior: Behavior normal.     (all labs ordered are listed, but only abnormal results are displayed) Labs Reviewed  BASIC METABOLIC PANEL WITH GFR - Abnormal; Notable for the following components:      Result Value   Glucose, Bld 118 (*)    Calcium  7.8 (*)    All other components within normal limits  CBC - Abnormal; Notable for the following components:   WBC 13.2 (*)    Hemoglobin 10.5 (*)    HCT 33.5 (*)    RDW 16.2 (*)    Platelets 420 (*)    All other components within normal limits  TROPONIN I (HIGH SENSITIVITY)  TROPONIN I (HIGH SENSITIVITY)    EKG: None  Radiology: CT Lumbar Spine Wo Contrast Result Date: 11/21/2023 CLINICAL DATA:  53 year old female with history of T12 compression fracture, prior surgery. Chest pain. EXAM: CT LUMBAR SPINE WITHOUT CONTRAST TECHNIQUE: Multidetector CT imaging of the lumbar spine was performed without intravenous contrast administration. Multiplanar CT image reconstructions were also generated. RADIATION DOSE REDUCTION: This exam was performed according to the departmental dose-optimization program which includes automated exposure control, adjustment of the mA and/or kV according to patient size and/or use of iterative reconstruction technique. COMPARISON:  CTA chest and CT thoracic spine today reported separately. CT lumbar spine 07/27/2023. Lumbar MRI 10/25/2023. FINDINGS: Segmentation: Normal, the same numbering system used previously. Alignment: Stable.  Straightening of lumbar lordosis. Vertebrae: Chronic T12 burst fracture, progressed since 07/27/2023 but detailed separately today. Left posterior 11th rib fracture redemonstrated. However, on these images a nondisplaced right posterior 11th rib fracture is also suspected on series 4, image 25. Underlying diffuse osteopenia. Postoperative  changes at L1, see details below. Superimposed minimally displaced right L1 transverse process fracture which was not apparent on previous exams, series 4, image 52. Questionable left L1 transverse process fracture also on coronal image 47. L2 mild inferior endplate compression appears stable from the MRI 10/25/2023. Chronic mild L3 superior endplate compression is stable. Chronic moderate L4 superior endplate compression is stable. Subacute L5 superior endplate compression (series 9, image 38) appears stable from the MRI this month. Visible sacrum and SI joints intact. Paraspinal and other soft tissues: CTA chest reported separately today. Excreted IV contrast in nondilated renal collecting systems and ureters. Negative other visible abdominal viscera. Postoperative changes to the lower thoracic and lumbar paraspinal soft tissues with midline subcutaneous mildly complex fluid collection most likely seroma. Disc levels: T12-L1:  Interval posterior decompression. L1-L2: Bilateral L1 and L2 pedicle screws appear intact without loosening. Faint posterior bone graft material. No arthrodesis. L2-L3: Mild disc osteophyte complex in part related to mild L2 and L3 endplate deformities. No significant stenosis. L3-L4: Stable disc osteophyte complex in part related to L4 posterosuperior endplate retropulsion. Stable mild spinal stenosis and mild to moderate bilateral L3 foraminal stenosis from MRI this month. L4-L5: Stable mild disc osteophyte complex in part related to L5 endplate deformity. Stable mild to moderate left L4 foraminal stenosis. L5-S1: Stable disc bulging and endplate spurring, mild to moderate bilateral L5 foraminal stenosis. IMPRESSION: 1. Recent postoperative changes with posterior paraspinal subcutaneous seroma. Chronic T12 burst fracture with posterior decompression at that level. L1 and L2 pedicle screws appear intact without loosening. 2. Generalized osteopenia. Recent appearing bilateral 11th rib  fractures (a nondisplaced right 11th rib fracture is apparent on these images) and bilateral L1 transverse process fractures (non- to minimally displaced). 3. Otherwise stable L2 through L5 compression  fractures from MRI on 10/25/2023, both the L2 and L5 compression fractures are subacute. 4. Stable multifactorial lumbar neural foraminal stenosis, mild L3-L4 spinal stenosis, from MRI this month. Electronically Signed   By: VEAR Hurst M.D.   On: 11/21/2023 06:03   CT T-SPINE NO CHARGE Result Date: 11/21/2023 CLINICAL DATA:  53 year old female with history of T12 compression fracture, prior surgery. Chest pain. EXAM: CT THORACIC SPINE WITH CONTRAST TECHNIQUE: Multiplanar CT images of the thoracic spine were reconstructed from contemporary CTA of the Chest. RADIATION DOSE REDUCTION: This exam was performed according to the departmental dose-optimization program which includes automated exposure control, adjustment of the mA and/or kV according to patient size and/or use of iterative reconstruction technique. CONTRAST:  No additional COMPARISON:  CTA chest reported separately today. Thoracic spine CT 09/11/2023 and earlier. FINDINGS: Limited cervical spine imaging: Cervicothoracic junction alignment is within normal limits. Thoracic spine segmentation: Normal. Same numbering system used in May. Alignment: Stable since May. Relatively preserved thoracic kyphosis. No significant scoliosis or spondylolisthesis. Vertebrae: Diffuse osteopenia. Subtle superior endplate compression in the upper thoracic spine T2 through T4 is unchanged. T5 through T9 remain intact. New postoperative changes from T10 through the visible upper lumbar spine. See hardware details below. Superimposed chronic T11 superior endplate compression is stable. Superimposed severe chronic T12 compression and/or burst fracture appears stable since May including retropulsion. Interval posterior decompression, see below. Lumbar spine today reported separately.  Posterior right 12 rib fracture appears to be healing and nonacute. But posterior left 11th rib fracture with mild displacement on series 6, image 116 is new since 09/11/2023 and appears unhealed. Paraspinal and other soft tissues: Chest and abdomen detailed separately today. Thoracic paraspinal soft tissues above T10 are normal. Postoperative paraspinal soft tissue changes beginning at T10. Posterior paraspinal fluid collection beginning at the T9 level and continuing caudally, low to mildly complex fluid density. This is probably postoperative seroma (series 5, image 110). No gas identified within the visible collection. Disc levels: Stable from T1-T2 through T9-T10 compared to the May Spine CT. Chronic right side facet degeneration at T2-T3, possible developing facet ankylosis there series 3, image 24. No thoracic spinal stenosis at these levels. T10-T11: New bilateral pedicle screws. Hardware appears intact. No evidence of loosening. Posterior bone graft material. No arthrodesis. T11-T12: Posterior decompression. Chronic T12 retropulsion. Posterior bone graft material. No arthrodesis. T12-L1: Reported with lumbar spine separately today. IMPRESSION: 1. Postoperative changes from T10 through the visible upper lumbar spine. Intact thoracic pedicle screws. Posterior paraspinal fluid collection, most likely seroma. 2. Posterior left 11th rib fracture appears recent and is mildly displaced. A healing posterior right 12th rib fracture is probably associated with T12 burst fracture (see #3). 3. Stable T12 burst fracture since thoracic CT 09/11/2023. Interval posterior decompression. 4. No other acute osseous abnormality identified. Underlying osteopenia with chronic mild thoracic superior compression fractures elsewhere including T4 and T11. 5. CTA chest today reported separately. Electronically Signed   By: VEAR Hurst M.D.   On: 11/21/2023 05:53   CT Angio Chest Pulmonary Embolism (PE) W or WO Contrast Result Date:  11/21/2023 CLINICAL DATA:  High probability pulmonary embolism EXAM: CT ANGIOGRAPHY CHEST WITH CONTRAST TECHNIQUE: Multidetector CT imaging of the chest was performed using the standard protocol during bolus administration of intravenous contrast. Multiplanar CT image reconstructions and MIPs were obtained to evaluate the vascular anatomy. RADIATION DOSE REDUCTION: This exam was performed according to the departmental dose-optimization program which includes automated exposure control, adjustment of the mA and/or kV  according to patient size and/or use of iterative reconstruction technique. CONTRAST:  75mL OMNIPAQUE  IOHEXOL  350 MG/ML SOLN COMPARISON:  06/25/2022 FINDINGS: Cardiovascular: Adequate opacification of the pulmonary arterial tree. No intraluminal filling defect identified to suggest acute pulmonary embolism. Central pulmonary arteries are of normal caliber. No significant coronary artery calcification. Cardiac size is within normal limits. No pericardial effusion. Mild atherosclerotic calcification within the thoracic aorta. No aortic aneurysm. Mediastinum/Nodes: No enlarged mediastinal, hilar, or axillary lymph nodes. Thyroid  gland, trachea, and esophagus demonstrate no significant findings. Lungs/Pleura: There is elevation of the right hemidiaphragm, similar to prior examination, with increasing right basilar atelectasis, progressive right-sided volume loss and slight mediastinal shift to the right. No superimposed confluent pulmonary infiltrate. No pneumothorax. Trace bilateral pleural effusions. Upper Abdomen: No acute abnormality. Musculoskeletal: Acute appearing T12 burst fracture 5-6 mm retropulsion of the posterior vertebral body status post thoracolumbar fusion with instrumentation involving T10-T11 extending to involve at least L1 is incompletely visualized on this examination. Bilateral laminectomy and resection of the spinous process of T12 has been performed. Least 2 loculated high  attenuation fluid collections are seen within the subcutaneous soft tissues of the operative site measuring 3.9 x 6.0 cm and 3.4 x 4.3 cm compatible with postoperative hematoma Review of the MIP images confirms the above findings. IMPRESSION: 1. No evidence of pulmonary embolism. 2. Acute appearing T12 burst fracture status post thoracolumbar fusion with instrumentation involving T10-T11 extending to involve at least L1. This is incompletely visualized on this examination. 3. Two loculated fluid collections within the subcutaneous soft tissues of the operative site measuring 3.9 x 6.0 cm and 3.4 x 4.3 cm. These most likely represent postoperative hematoma, however, superimposed infection cannot be excluded on the basis of imaging alone. 4. Persistent elevation of the right hemidiaphragm with increasing right basilar atelectasis, progressive right-sided volume loss and slight mediastinal shift to the right. 5. Trace bilateral pleural effusions. Electronically Signed   By: Dorethia Molt M.D.   On: 11/21/2023 04:44   DG Chest 2 View Result Date: 11/21/2023 CLINICAL DATA:  Chest pain EXAM: CHEST - 2 VIEW COMPARISON:  10/24/2023 FINDINGS: Cardiac shadow is prominent but accentuated by the frontal technique. Patient rotation to the right is noted accentuating the mediastinal markings. Mild right basilar atelectasis is seen. Postsurgical changes in the thoracolumbar spine are noted. IMPRESSION: Mild right basilar atelectasis. Electronically Signed   By: Oneil Devonshire M.D.   On: 11/21/2023 02:19     Procedures   Medications Ordered in the ED  HYDROmorphone  (DILAUDID ) injection 1 mg (1 mg Intravenous Given 11/21/23 0248)  ondansetron  (ZOFRAN ) injection 4 mg (4 mg Intravenous Given 11/21/23 0248)  iohexol  (OMNIPAQUE ) 350 MG/ML injection 75 mL (75 mLs Intravenous Contrast Given 11/21/23 0409)  HYDROmorphone  (DILAUDID ) injection 1 mg (1 mg Intravenous Given 11/21/23 0502)                                     Medical Decision Making Amount and/or Complexity of Data Reviewed External Data Reviewed: radiology, ECG and notes. Labs: ordered. Decision-making details documented in ED Course. Radiology: ordered and independent interpretation performed. Decision-making details documented in ED Course. ECG/medicine tests: ordered and independent interpretation performed. Decision-making details documented in ED Course.  Risk Prescription drug management.   Differential Diagnosis considered includes, but not limited to: STEMI; NSTEMI; myocarditis; pericarditis; pulmonary embolism; aortic dissection; pneumothorax; pneumonia; gastritis; musculoskeletal pain   Patient presents to the emergency  department for evaluation of chest pain.  Patient with history of coronary artery disease and cardiac arrest.  Of note, she was seen by cardiology before her spinal surgery this month and was cleared, did not have any concern for need for ischemic workup.  Patient without EKG changes.  Troponin less than 2, repeat less than 2.  This is a reassuring cardiac workup.  With recent surgery, PE was considered.  She underwent CT angiography that did not show any evidence of PE.  There was, however, evidence of recent surgery with evidence of T12 burst fracture.  CT thoracic and lumbar spine performed.  This T12 burst fracture appears to be chronic in nature, compared to prior imaging.  No new fractures noted.  Postoperative changes without any concerning features.  Patient neurologically intact.  No further workup necessary.     Final diagnoses:  Chest pain, unspecified type    ED Discharge Orders     None          Haze Lonni PARAS, MD 11/21/23 [phone]

## 2023-11-21 NOTE — ED Triage Notes (Signed)
 Patient BIB [facility] from home for evaluation of chest pain. Patient has hx cardiac arrest. Patient reports chest pain radiating up the neck. FSBS 122 with EMS.   BP 148/94 [id number]

## 2023-11-24 ENCOUNTER — Other Ambulatory Visit (HOSPITAL_COMMUNITY): Payer: Self-pay

## 2023-11-25 ENCOUNTER — Encounter: Payer: Self-pay | Admitting: Internal Medicine

## 2023-11-25 ENCOUNTER — Other Ambulatory Visit: Payer: Self-pay

## 2023-11-25 ENCOUNTER — Other Ambulatory Visit (HOSPITAL_COMMUNITY): Payer: Self-pay

## 2023-11-25 MED ORDER — DOXYCYCLINE HYCLATE 100 MG PO TABS
100.0000 mg | ORAL_TABLET | Freq: Two times a day (BID) | ORAL | 0 refills | Status: DC
  Filled 2023-11-25: qty 28, 14d supply, fill #0

## 2023-11-26 ENCOUNTER — Other Ambulatory Visit (HOSPITAL_COMMUNITY): Payer: Self-pay

## 2023-11-27 ENCOUNTER — Other Ambulatory Visit (HOSPITAL_COMMUNITY): Payer: Self-pay

## 2023-11-29 ENCOUNTER — Other Ambulatory Visit (HOSPITAL_COMMUNITY): Payer: Self-pay

## 2023-11-29 ENCOUNTER — Other Ambulatory Visit: Payer: Self-pay

## 2023-11-29 MED ORDER — MOUNJARO 10 MG/0.5ML ~~LOC~~ SOAJ
10.0000 mg | SUBCUTANEOUS | 1 refills | Status: DC
  Filled 2023-11-29 – 2024-01-06 (×2): qty 2, 28d supply, fill #0

## 2023-11-29 MED ORDER — ONDANSETRON 8 MG PO TBDP
8.0000 mg | ORAL_TABLET | Freq: Three times a day (TID) | ORAL | 3 refills | Status: DC | PRN
  Filled 2023-11-29: qty 45, 15d supply, fill #0
  Filled 2023-12-10 – 2023-12-11 (×2): qty 45, 15d supply, fill #1
  Filled 2024-02-11: qty 45, 15d supply, fill #2

## 2023-11-29 MED ORDER — TIZANIDINE HCL 4 MG PO TABS
4.0000 mg | ORAL_TABLET | Freq: Four times a day (QID) | ORAL | 5 refills | Status: DC | PRN
  Filled 2023-12-18 (×2): qty 120, 30d supply, fill #0
  Filled 2024-01-30 (×2): qty 120, 30d supply, fill #1

## 2023-11-29 MED ORDER — OXYCODONE HCL 15 MG PO TABS
15.0000 mg | ORAL_TABLET | Freq: Four times a day (QID) | ORAL | 0 refills | Status: DC | PRN
  Filled 2023-11-29: qty 120, 30d supply, fill #0

## 2023-12-05 ENCOUNTER — Other Ambulatory Visit (HOSPITAL_COMMUNITY): Payer: Self-pay

## 2023-12-09 ENCOUNTER — Other Ambulatory Visit (HOSPITAL_COMMUNITY): Payer: Self-pay

## 2023-12-10 ENCOUNTER — Other Ambulatory Visit (HOSPITAL_COMMUNITY): Payer: Self-pay

## 2023-12-10 ENCOUNTER — Other Ambulatory Visit: Payer: Self-pay

## 2023-12-10 MED ORDER — TIZANIDINE HCL 4 MG PO TABS
4.0000 mg | ORAL_TABLET | Freq: Three times a day (TID) | ORAL | 0 refills | Status: DC | PRN
  Filled 2023-12-10: qty 45, 15d supply, fill #0

## 2023-12-11 ENCOUNTER — Other Ambulatory Visit (HOSPITAL_COMMUNITY): Payer: Self-pay

## 2023-12-11 MED ORDER — PROMETHAZINE HCL 25 MG PO TABS
25.0000 mg | ORAL_TABLET | Freq: Three times a day (TID) | ORAL | 1 refills | Status: DC | PRN
  Filled 2023-12-11: qty 90, 30d supply, fill #0

## 2023-12-13 ENCOUNTER — Other Ambulatory Visit (HOSPITAL_COMMUNITY): Payer: Self-pay

## 2023-12-14 ENCOUNTER — Other Ambulatory Visit (HOSPITAL_COMMUNITY): Payer: Self-pay

## 2023-12-16 ENCOUNTER — Other Ambulatory Visit (HOSPITAL_COMMUNITY): Payer: Self-pay

## 2023-12-18 ENCOUNTER — Other Ambulatory Visit (HOSPITAL_COMMUNITY): Payer: Self-pay

## 2023-12-18 ENCOUNTER — Other Ambulatory Visit: Payer: Self-pay

## 2023-12-18 MED ORDER — TRAZODONE HCL 300 MG PO TABS
600.0000 mg | ORAL_TABLET | Freq: Every day | ORAL | 0 refills | Status: DC
  Filled 2023-12-18 – 2024-02-01 (×2): qty 180, 90d supply, fill #0

## 2023-12-18 MED ORDER — DULOXETINE HCL 60 MG PO CPEP
60.0000 mg | ORAL_CAPSULE | Freq: Two times a day (BID) | ORAL | 1 refills | Status: DC
  Filled 2023-12-18: qty 180, 90d supply, fill #0

## 2023-12-18 MED ORDER — ARIPIPRAZOLE 10 MG PO TABS
10.0000 mg | ORAL_TABLET | Freq: Every day | ORAL | 1 refills | Status: DC
  Filled 2023-12-18: qty 90, 90d supply, fill #0

## 2023-12-18 MED ORDER — CLONAZEPAM 0.5 MG PO TABS
0.5000 mg | ORAL_TABLET | Freq: Every day | ORAL | 2 refills | Status: DC
  Filled 2023-12-18: qty 30, 30d supply, fill #0
  Filled 2024-01-21: qty 30, 30d supply, fill #1
  Filled 2024-02-18: qty 30, 30d supply, fill #2

## 2023-12-18 MED ORDER — PRAZOSIN HCL 5 MG PO CAPS
5.0000 mg | ORAL_CAPSULE | Freq: Every day | ORAL | 2 refills | Status: DC
  Filled 2023-12-18: qty 90, 90d supply, fill #0

## 2023-12-19 ENCOUNTER — Other Ambulatory Visit (HOSPITAL_COMMUNITY): Payer: Self-pay

## 2023-12-24 ENCOUNTER — Other Ambulatory Visit (HOSPITAL_COMMUNITY): Payer: Self-pay

## 2023-12-27 ENCOUNTER — Other Ambulatory Visit (HOSPITAL_COMMUNITY): Payer: Self-pay

## 2023-12-27 MED ORDER — OXYCODONE HCL 15 MG PO TABS
15.0000 mg | ORAL_TABLET | Freq: Four times a day (QID) | ORAL | 0 refills | Status: DC | PRN
  Filled 2023-12-27: qty 120, 30d supply, fill #0

## 2023-12-30 ENCOUNTER — Other Ambulatory Visit (HOSPITAL_COMMUNITY): Payer: Self-pay

## 2023-12-30 MED ORDER — TIZANIDINE HCL 4 MG PO TABS
4.0000 mg | ORAL_TABLET | Freq: Three times a day (TID) | ORAL | 0 refills | Status: DC | PRN
  Filled 2023-12-30 – 2024-01-06 (×2): qty 45, 15d supply, fill #0

## 2023-12-31 ENCOUNTER — Other Ambulatory Visit (HOSPITAL_COMMUNITY): Payer: Self-pay

## 2023-12-31 ENCOUNTER — Emergency Department (HOSPITAL_COMMUNITY)

## 2023-12-31 ENCOUNTER — Other Ambulatory Visit: Payer: Self-pay

## 2023-12-31 ENCOUNTER — Emergency Department (HOSPITAL_COMMUNITY)
Admission: EM | Admit: 2023-12-31 | Discharge: 2024-01-01 | Disposition: A | Discharge status: Home / Self Care | Attending: Emergency Medicine | Admitting: Emergency Medicine

## 2023-12-31 ENCOUNTER — Encounter (HOSPITAL_COMMUNITY): Payer: Self-pay

## 2023-12-31 DIAGNOSIS — R202 Paresthesia of skin: Secondary | ICD-10-CM | POA: Insufficient documentation

## 2023-12-31 DIAGNOSIS — I2510 Atherosclerotic heart disease of native coronary artery without angina pectoris: Secondary | ICD-10-CM | POA: Insufficient documentation

## 2023-12-31 DIAGNOSIS — Z85828 Personal history of other malignant neoplasm of skin: Secondary | ICD-10-CM | POA: Insufficient documentation

## 2023-12-31 DIAGNOSIS — M545 Low back pain, unspecified: Secondary | ICD-10-CM

## 2023-12-31 DIAGNOSIS — I10 Essential (primary) hypertension: Secondary | ICD-10-CM | POA: Insufficient documentation

## 2023-12-31 DIAGNOSIS — E039 Hypothyroidism, unspecified: Secondary | ICD-10-CM | POA: Insufficient documentation

## 2023-12-31 DIAGNOSIS — R110 Nausea: Secondary | ICD-10-CM | POA: Diagnosis not present

## 2023-12-31 DIAGNOSIS — M549 Dorsalgia, unspecified: Secondary | ICD-10-CM | POA: Diagnosis present

## 2023-12-31 DIAGNOSIS — D72829 Elevated white blood cell count, unspecified: Secondary | ICD-10-CM | POA: Diagnosis not present

## 2023-12-31 DIAGNOSIS — Z79899 Other long term (current) drug therapy: Secondary | ICD-10-CM | POA: Diagnosis not present

## 2023-12-31 DIAGNOSIS — R1084 Generalized abdominal pain: Secondary | ICD-10-CM | POA: Diagnosis not present

## 2023-12-31 LAB — CBC WITH DIFFERENTIAL/PLATELET
Abs Immature Granulocytes: 0.06 K/uL (ref 0.00–0.07)
Basophils Absolute: 0.1 K/uL (ref 0.0–0.1)
Basophils Relative: 0 %
Eosinophils Absolute: 0 K/uL (ref 0.0–0.5)
Eosinophils Relative: 0 %
HCT: 46.4 % — ABNORMAL HIGH (ref 36.0–46.0)
Hemoglobin: 13.7 g/dL (ref 12.0–15.0)
Immature Granulocytes: 0 %
Lymphocytes Relative: 10 %
Lymphs Abs: 1.4 K/uL (ref 0.7–4.0)
MCH: 24.9 pg — ABNORMAL LOW (ref 26.0–34.0)
MCHC: 29.5 g/dL — ABNORMAL LOW (ref 30.0–36.0)
MCV: 84.4 fL (ref 80.0–100.0)
Monocytes Absolute: 1 K/uL (ref 0.1–1.0)
Monocytes Relative: 7 %
Neutro Abs: 11.2 K/uL — ABNORMAL HIGH (ref 1.7–7.7)
Neutrophils Relative %: 83 %
Platelets: 514 K/uL — ABNORMAL HIGH (ref 150–400)
RBC: 5.5 MIL/uL — ABNORMAL HIGH (ref 3.87–5.11)
RDW: 16.4 % — ABNORMAL HIGH (ref 11.5–15.5)
WBC: 13.7 K/uL — ABNORMAL HIGH (ref 4.0–10.5)
nRBC: 0 % (ref 0.0–0.2)

## 2023-12-31 LAB — COMPREHENSIVE METABOLIC PANEL WITH GFR
ALT: 15 U/L (ref 0–44)
AST: 19 U/L (ref 15–41)
Albumin: 4.2 g/dL (ref 3.5–5.0)
Alkaline Phosphatase: 127 U/L — ABNORMAL HIGH (ref 38–126)
Anion gap: 17 — ABNORMAL HIGH (ref 5–15)
BUN: <5 mg/dL — ABNORMAL LOW (ref 6–20)
CO2: 26 mmol/L (ref 22–32)
Calcium: 9.8 mg/dL (ref 8.9–10.3)
Chloride: 99 mmol/L (ref 98–111)
Creatinine, Ser: 0.74 mg/dL (ref 0.44–1.00)
GFR, Estimated: >60 mL/min (ref >60)
Glucose, Bld: 119 mg/dL — ABNORMAL HIGH (ref 70–99)
Potassium: 3.4 mmol/L — ABNORMAL LOW (ref 3.5–5.1)
Sodium: 142 mmol/L (ref 135–145)
Total Bilirubin: 0.6 mg/dL (ref 0.0–1.2)
Total Protein: 8.8 g/dL — ABNORMAL HIGH (ref 6.5–8.1)

## 2023-12-31 LAB — URINALYSIS, W/ REFLEX TO CULTURE (INFECTION SUSPECTED)
Bacteria, UA: NONE SEEN
Glucose, UA: NEGATIVE mg/dL
Hgb urine dipstick: NEGATIVE
Ketones, ur: 20 mg/dL — AB
Nitrite: NEGATIVE
Protein, ur: 100 mg/dL — AB
Specific Gravity, Urine: 1.025 (ref 1.005–1.030)
pH: 6 (ref 5.0–8.0)

## 2023-12-31 LAB — I-STAT CG4 LACTIC ACID, ED
Lactic Acid, Venous: 2.3 mmol/L (ref 0.5–1.9)
Lactic Acid, Venous: 1.7 mmol/L (ref 0.5–1.9)

## 2023-12-31 LAB — PROTIME-INR
INR: 1.1 (ref 0.8–1.2)
Prothrombin Time: 15 s (ref 11.4–15.2)

## 2023-12-31 MED ORDER — HYDROMORPHONE HCL 1 MG/ML IJ SOLN
1.0000 mg | Freq: Once | INTRAMUSCULAR | Status: AC
  Administered 2023-12-31: 1 mg via INTRAVENOUS
  Filled 2023-12-31: qty 1

## 2023-12-31 MED ORDER — MORPHINE SULFATE (PF) 4 MG/ML IV SOLN
4.0000 mg | Freq: Once | INTRAVENOUS | Status: AC
  Administered 2023-12-31: 4 mg via INTRAVENOUS
  Filled 2023-12-31: qty 1

## 2023-12-31 MED ORDER — SODIUM CHLORIDE 0.9 % IV BOLUS
500.0000 mL | Freq: Once | INTRAVENOUS | Status: AC
  Administered 2023-12-31: 500 mL via INTRAVENOUS

## 2023-12-31 NOTE — ED Triage Notes (Signed)
 Pt recently had surgery on her back and feels that it is not healing well. Pt reports having a fever and back pain since last night.

## 2023-12-31 NOTE — ED Provider Notes (Signed)
 [facility] AT [facility] Provider Note   CSN: [id number] Arrival date & time: 12/31/23  1645     Patient presents with: Back Pain   [name] is a 53 y.o. female.   Patient is a 53 year old female who presents with back pain.  She has a history of ORIF of a T12 fracture by Dr. Joshua on July 18.  She has had some ongoing back pain since that time but its gotten worse over the last few days.  She has had some subjective fevers at home up to 100.  She also has abdominal pain but she says that is chronic for her.  She has had some nausea but no vomiting.  No urinary symptoms.  She denies any weakness in her legs.  She has some tingling in her feet but that has been going on since even before the surgery.  No loss of bowel or bladder control.  No cough or cold symptoms.       Prior to Admission medications   Medication Sig Start Date End Date Taking? Authorizing Provider  acetaminophen  (TYLENOL ) 650 MG CR tablet Take 1,300 mg by mouth every 8 (eight) hours as needed for pain.    [provider]  ARIPiprazole  (ABILIFY ) 10 MG tablet Take 1 tablet (10 mg total) by mouth daily. 10/16/23     ARIPiprazole  (ABILIFY ) 10 MG tablet Take 1 tablet (10 mg total) by mouth daily. 11/15/23     ARIPiprazole  (ABILIFY ) 10 MG tablet 1 tablet by mouth daily 12/18/23     atorvastatin  (LIPITOR) 20 MG tablet Take 1 tablet (20 mg total) by mouth daily. 09/28/23     carvedilol  (COREG ) 6.25 MG tablet Take 1 tablet (6.25 mg total) by mouth 2 (two) times daily with a meal. 10/26/23   Kathrin Mignon DASEN, MD  carvedilol  (COREG ) 6.25 MG tablet Take 1 tablet (6.25 mg total) by mouth 2 (two) times daily. 11/14/23     clonazePAM  (KLONOPIN ) 0.5 MG tablet Take 1 tablet (0.5 mg total) by mouth daily as needed for severe breakthrough panic/anxiety 12/18/23     doxycycline  (VIBRA -TABS) 100 MG tablet Take 1 tablet (100 mg total) by mouth 2 (two) times daily. 11/25/23     DULoxetine  (CYMBALTA ) 60 MG  capsule Take 1 capsule (60 mg total) by mouth 2 (two) times daily. 10/16/23     DULoxetine  (CYMBALTA ) 60 MG capsule Take 1 capsule (60 mg total) by mouth 2 (two) times daily. 11/15/23     DULoxetine  (CYMBALTA ) 60 MG capsule Take 1 capsule (60 mg total) by mouth 2 (two) times daily. 12/18/23     ibuprofen  (ADVIL ) 200 MG tablet Take 400 mg by mouth daily as needed for moderate pain (pain score 4-6) or mild pain (pain score 1-3).    [provider]  levothyroxine  (SYNTHROID ) 50 MCG tablet Take 50 mcg by mouth daily before breakfast.    [provider]  lidocaine  (LIDODERM ) 5 % Place 1 patch onto the skin daily. Remove & Discard patch within 12 hours or as directed by MD 10/11/23   Rai, Nydia POUR, MD  metoCLOPramide  (REGLAN ) 5 MG tablet Take 1 tablet (5 mg total) by mouth every 8 (eight) hours as needed for nausea or vomiting. 10/11/23   Rai, Nydia POUR, MD  multivitamin (RENA-VIT) TABS tablet Take 1 tablet by mouth at bedtime. 08/01/20   Tobie Burgess Op[place], MD  ondansetron  (ZOFRAN -ODT) 8 MG disintegrating tablet Take 1 tablet (8 mg total) by mouth  every 8 (eight) hours as needed for nausea and vomiting. 11/29/23     oxyCODONE  (ROXICODONE ) 15 MG immediate release tablet Take 1 tablet (15 mg total) by mouth every 6 (six) hours as needed. 11/29/23     oxyCODONE  (ROXICODONE ) 15 MG immediate release tablet Take 1 tablet (15 mg total) by mouth 4 (four) times daily as needed. 11/29/23     oxyCODONE  (ROXICODONE ) 15 MG immediate release tablet Take 1 tablet (15 mg total) by mouth 4 (four) times daily as needed. 12/27/23     pantoprazole  (PROTONIX ) 40 MG tablet Take 1 tablet by mouth twice daily 09/21/22   Kerman Vina HERO, NP  prazosin  (MINIPRESS ) 5 MG capsule Take 1 capsule (5 mg total) by mouth at bedtime. 10/16/23     prazosin  (MINIPRESS ) 5 MG capsule Take 1 capsule (5 mg total) by mouth at bedtime. 11/15/23     prazosin  (MINIPRESS ) 5 MG capsule Take 1 capsule (5 mg total) by mouth at bedtime. 12/18/23      pregabalin  (LYRICA ) 50 MG capsule Take 50 mg by mouth 2 (two) times daily. 07/10/23   [provider]  PROLIA 60 MG/ML SOSY injection Inject 60 mg into the skin every 6 (six) months. 09/30/23   [provider]  promethazine  (PHENERGAN ) 25 MG tablet Take 1 tablet (25 mg total) by mouth 3 (three) times daily as needed. 12/11/23     tirzepatide  (MOUNJARO ) 10 MG/0.5ML Pen Inject 10 mg into the skin once a week. 11/29/23     tirzepatide  (MOUNJARO ) 7.5 MG/0.5ML Pen Inject 7.5 mg into the skin once a week. 10/22/23     tiZANidine  (ZANAFLEX ) 4 MG tablet Take 1 tablet (4 mg total) by mouth every 6 (six) hours as needed. 10/22/23     tiZANidine  (ZANAFLEX ) 4 MG tablet Take 1 tablet (4 mg total) by mouth every 6 (six) hours as needed. 11/29/23     tiZANidine  (ZANAFLEX ) 4 MG tablet Take 1 tablet (4 mg total) by mouth every 8 (eight) hours as needed for pain. 12/30/23     trazodone  (DESYREL ) 300 MG tablet Take 2 tablets (600 mg total) by mouth at bedtime. 10/16/23     trazodone  (DESYREL ) 300 MG tablet Take 2 tablets (600 mg total) by mouth at bedtime. 11/15/23     trazodone  (DESYREL ) 300 MG tablet Take 2 tablets (600 mg total) by mouth at bedtime. 12/18/23     Vitamin D , Ergocalciferol , (DRISDOL ) 1.25 MG (50000 UNIT) CAPS capsule Take 1 capsule (50,000 Units total) by mouth every 7 (seven) days. 11/01/23   Gonfa, Taye T, MD    Allergies: Patient has no known allergies.    Review of Systems  Constitutional:  Negative for chills, diaphoresis, fatigue and fever.  HENT:  Negative for congestion, rhinorrhea and sneezing.   Eyes: Negative.   Respiratory:  Negative for cough, chest tightness and shortness of breath.   Cardiovascular:  Negative for chest pain and leg swelling.  Gastrointestinal:  Positive for abdominal pain and nausea. Negative for blood in stool, diarrhea and vomiting.  Genitourinary:  Negative for difficulty urinating, flank pain, frequency and hematuria.  Musculoskeletal:  Positive for back  pain. Negative for arthralgias.  Skin:  Negative for rash.  Neurological:  Positive for numbness (Tingling to her feet). Negative for dizziness, speech difficulty, weakness and headaches.    Updated Vital Signs BP (!) 162/110 (BP Location: Left Arm)   Pulse (!) 105   Temp 98.6 F (37 C)   Resp 17   Ht  5' 4.5 (1.638 m)   Wt 89.4 kg   LMP 06/03/2018 (Approximate) Comment: neg hcg 05/10/20  SpO2 93%   BMI 33.31 kg/m   Physical Exam Constitutional:      Appearance: She is well-developed.  HENT:     Head: Normocephalic and atraumatic.  Eyes:     Pupils: Pupils are equal, round, and reactive to light.  Cardiovascular:     Rate and Rhythm: Normal rate and regular rhythm.     Heart sounds: Normal heart sounds.  Pulmonary:     Effort: Pulmonary effort is normal. No respiratory distress.     Breath sounds: Normal breath sounds. No wheezing or rales.  Chest:     Chest wall: No tenderness.  Abdominal:     General: Bowel sounds are normal.     Palpations: Abdomen is soft.     Tenderness: There is abdominal tenderness (Mild diffuse tenderness). There is no guarding or rebound.  Musculoskeletal:        General: Normal range of motion.     Cervical back: Normal range of motion and neck supple.  Lymphadenopathy:     Cervical: No cervical adenopathy.  Skin:    General: Skin is warm and dry.     Findings: No rash.  Neurological:     Mental Status: She is alert and oriented to person, place, and time.     Comments: Motor 5 out of 5 to lower extremities bilaterally, sensation grossly intact to lower extremities bilaterally     (all labs ordered are listed, but only abnormal results are displayed) Labs Reviewed  COMPREHENSIVE METABOLIC PANEL WITH GFR - Abnormal; Notable for the following components:      Result Value   Potassium 3.4 (*)    Glucose, Bld 119 (*)    BUN <5 (*)    Total Protein 8.8 (*)    Alkaline Phosphatase 127 (*)    Anion gap 17 (*)    All other components  within normal limits  CBC WITH DIFFERENTIAL/PLATELET - Abnormal; Notable for the following components:   WBC 13.7 (*)    RBC 5.50 (*)    HCT 46.4 (*)    MCH 24.9 (*)    MCHC 29.5 (*)    RDW 16.4 (*)    Platelets 514 (*)    Neutro Abs 11.2 (*)    All other components within normal limits  I-STAT CG4 LACTIC ACID, ED - Abnormal; Notable for the following components:   Lactic Acid, Venous 2.3 (*)    All other components within normal limits  CULTURE, BLOOD (ROUTINE X 2)  CULTURE, BLOOD (ROUTINE X 2)  PROTIME-INR  URINALYSIS, W/ REFLEX TO CULTURE (INFECTION SUSPECTED)  I-STAT CG4 LACTIC ACID, ED  I-STAT CG4 LACTIC ACID, ED    EKG: None  Radiology: No results found.   Procedures   Medications Ordered in the ED  morphine  (PF) 4 MG/ML injection 4 mg (4 mg Intravenous Given 12/31/23 2002)  sodium chloride  0.9 % bolus 500 mL (500 mLs Intravenous New Bag/Given 12/31/23 2156)  morphine  (PF) 4 MG/ML injection 4 mg (4 mg Intravenous Given 12/31/23 2154)                                    Medical Decision Making Amount and/or Complexity of Data Reviewed Radiology: ordered.  Risk Prescription drug management.   This patient presents to the ED for concern of back pain,  abdominal pain, this involves an extensive number of treatment options, and is a complaint that carries with it a high risk of complications and morbidity.  I considered the following differential and admission for this acute, potentially life threatening condition.  The differential diagnosis includes postop infection, spinal epidural abscess, spinal epidural hematoma, abdominal source for the pain such as colitis, appendicitis, kidney stone, gallbladder disease, UTI  MDM:    Patient is a 53 year old who had recent back surgery who presents with worsening back pain.  She does not have any neurologic deficits or clinical concerns for cauda equina.  She is afebrile here although she reports a temperature up to 100 at home.   She is mildly tachycardic.  Her back does not have any abnormal appearance.  There is no redness or swelling.  The incision site is well-healed.  She does have abdominal pain which she says is typical for her.  However her labs come back with an elevated WBC count and an elevated lactate.  Discussed with neurosurgery APP Kim.  We do not have MRI capability here.  Will check a CT scan of her spine as well as a CT abdomen pelvis to rule out abdominal etiology for the symptoms.  CT scan shows some postsurgical changes but nothing that appears to be an obvious abscess or infection.  CT of her abdomen pelvis do not show any acute abnormality.  Her lactate is normalized.  She is afebrile here.  Have not started antibiotics as I do not have a clear source.  Urinalysis is pending.  Per discussion with neurosurgery, plan was to transfer to [facility] for MRI if the CT scans do not show an obvious source.  Will initiate transfer, discussed with Dr. Raford who is excepted patient for transfer.  (Labs, imaging, consults)  Labs: I Ordered, and personally interpreted labs.  The pertinent results include: Elevated WBC count, elevated lactate  Imaging Studies ordered: I ordered imaging studies including CT lumbar spine, CT thoracic spine, CT abdomen pelvis I independently visualized and interpreted imaging. I agree with the radiologist interpretation  Additional history obtained from chart.  External records from outside source obtained and reviewed including history  Cardiac Monitoring: The patient was maintained on a cardiac monitor.  If on the cardiac monitor, I personally viewed and interpreted the cardiac monitored which showed an underlying rhythm of: Sinus tachycardia  Reevaluation: After the interventions noted above, I reevaluated the patient and found that they have :improved  Social Determinants of Health:    Disposition: Transfer to [facility] for MRI  Co morbidities that complicate the patient  evaluation  Past Medical History:  Diagnosis Date   Anxiety    Arthritis    joints ache all over (10/15/2014)   Barrett's esophagus    Bulging lumbar disc    Cardiac arrest (HCC)    Chronic lower back pain    Coronary artery disease    DDD (degenerative disc disease), cervical    Depression    DM (diabetes mellitus) (HCC)    Drug-seeking behavior    GERD (gastroesophageal reflux disease)    Headache    weekly (10/15/2014)   Hyperlipemia    Hypertension    Hypothyroidism    PTSD (post-traumatic stress disorder)    Skin cancer    had them cut off my arms; don't know what kind     Medicines Meds ordered this encounter  Medications   morphine  (PF) 4 MG/ML injection 4 mg   sodium chloride  0.9 %  bolus 500 mL   morphine  (PF) 4 MG/ML injection 4 mg    I have reviewed the patients home medicines and have made adjustments as needed  Problem List / ED Course: Problem List Items Addressed This Visit   None            Final diagnoses:  None    ED Discharge Orders     None          Lenor Hollering, MD 12/31/23 2343

## 2023-12-31 NOTE — ED Notes (Signed)
 Called [facility] to transport pt. To [facility] ED.

## 2023-12-31 NOTE — ED Notes (Signed)
 Unable to obtain blood after 2 attempts. Pt will need US  IV

## 2023-12-31 NOTE — ED Provider Triage Note (Signed)
 Emergency Medicine Provider Triage Evaluation Note  [name] , a 53 y.o. female  was evaluated in triage.  Pt complains of back pain at surgical site. History of ORIF of thoracic fracture with laminectomy, fixation, and arthrodesis on 11/06/23. Pain has intensified since last night. She has a low grade temperature and is tachycardic in triage.  Review of Systems  Positive: Back pain, fever Negative: Abdominal pain, shortness of breath, chest pain, incontinence  Physical Exam  BP (!) 150/114 (BP Location: Left Arm)   Pulse (!) 126   Temp 99.3 F (37.4 C) (Oral)   Resp 14   Ht 5' 4.5 (1.638 m)   Wt 89.4 kg   LMP 06/03/2018 (Approximate) Comment: neg hcg 05/10/20  SpO2 100%   BMI 33.31 kg/m  Gen:   Awake, appears uncomfortable Resp:  Normal effort  MSK:   Moves extremities without difficulty  Other:  Surgical wound appears to be healing well. Minimal redness noted without drainage.  Medical Decision Making  Medically screening exam initiated at 5:02 PM.  Appropriate orders placed.  [name] was informed that the remainder of the evaluation will be completed by another provider, this initial triage assessment does not replace that evaluation, and the importance of remaining in the ED until their evaluation is complete.  Concern for evolving sepsis--orders placed   Claudene Lenis, NP 12/31/23 1708

## 2024-01-01 ENCOUNTER — Encounter (HOSPITAL_COMMUNITY): Payer: Self-pay | Admitting: Neurological Surgery

## 2024-01-01 ENCOUNTER — Emergency Department (HOSPITAL_COMMUNITY)

## 2024-01-01 DIAGNOSIS — Z743 Need for continuous supervision: Secondary | ICD-10-CM | POA: Diagnosis not present

## 2024-01-01 DIAGNOSIS — I10 Essential (primary) hypertension: Secondary | ICD-10-CM | POA: Diagnosis not present

## 2024-01-01 DIAGNOSIS — M549 Dorsalgia, unspecified: Secondary | ICD-10-CM | POA: Diagnosis not present

## 2024-01-01 MED ORDER — GADOBUTROL 1 MMOL/ML IV SOLN
9.0000 mL | Freq: Once | INTRAVENOUS | Status: AC | PRN
  Administered 2024-01-01: 9 mL via INTRAVENOUS

## 2024-01-01 MED ORDER — HYDROMORPHONE HCL 1 MG/ML IJ SOLN
1.0000 mg | Freq: Once | INTRAMUSCULAR | Status: AC
  Administered 2024-01-01: 1 mg via INTRAVENOUS
  Filled 2024-01-01: qty 1

## 2024-01-01 MED ORDER — OXYCODONE-ACETAMINOPHEN 5-325 MG PO TABS
1.0000 | ORAL_TABLET | Freq: Once | ORAL | Status: AC
  Administered 2024-01-01: 1 via ORAL
  Filled 2024-01-01: qty 1

## 2024-01-01 MED ORDER — LORAZEPAM 1 MG PO TABS: 1.0000 mg | ORAL_TABLET | Freq: Once | ORAL | Status: DC

## 2024-01-01 NOTE — Consult Note (Signed)
 Subjective: Patient reports that she has had an increase in low back pain.  There is some history of potential fevers at home.  She has been in the emergency department with back pain and workup.  She has had imaging of the thoracic and lumbar spine with both CT and MRI.  She states she is feeling better.  She has had no drainage from her wound.  She has had no fall.  Status post thoracolumbar stabilization for T12 burst fracture.  This was done a couple of months ago.  Objective: Vital signs in last 24 hours: Temp:  [98.3 F (36.8 C)-99.3 F (37.4 C)] 98.3 F (36.8 C) (09/10 0728) Pulse Rate:  [99-126] 118 (09/10 0728) Resp:  [14-18] 15 (09/10 0728) BP: (144-165)/(98-116) 151/109 (09/10 0728) SpO2:  [92 %-100 %] 96 % (09/10 0728) Weight:  [89.4 kg] 89.4 kg (09/09 1657)  Intake/Output from previous day: No intake/output data recorded. Intake/Output this shift: No intake/output data recorded.  On exam she is awake and alert and looks nontoxic and comfortable.  She is lying on her side.  Her incision is well-healed without evidence of erythema or tenderness or swelling or drainage.  She is tender in the lumbar region below the incision.  Has good strength in her legs.  Lab Results: Lab Results  Component Value Date   WBC 13.7 (H) 12/31/2023   HGB 13.7 12/31/2023   HCT 46.4 (H) 12/31/2023   MCV 84.4 12/31/2023   PLT 514 (H) 12/31/2023   Lab Results  Component Value Date   INR 1.1 12/31/2023   BMET Lab Results  Component Value Date   NA 142 12/31/2023   K 3.4 (L) 12/31/2023   CL 99 12/31/2023   CO2 26 12/31/2023   GLUCOSE 119 (H) 12/31/2023   BUN <5 (L) 12/31/2023   CREATININE 0.74 12/31/2023   CALCIUM  9.8 12/31/2023    Studies/Results: MR Lumbar Spine W Wo Contrast Result Date: 01/01/2024 CLINICAL DATA:  Initial evaluation for acute postoperative infection. EXAM: MRI LUMBAR SPINE WITHOUT AND WITH CONTRAST TECHNIQUE: Multiplanar and multiecho pulse sequences of the  lumbar spine were obtained without and with intravenous contrast. CONTRAST:  9mL GADAVIST  GADOBUTROL  1 MMOL/ML IV SOLN COMPARISON:  Comparison made with prior CT from 12/31/2023 and MRI from 10/25/2023 FINDINGS: Segmentation: Standard. Lowest well-formed disc space labeled the L5-S1 level. Alignment: Postoperative changes from prior posterior fusion at T10 through L2, with posterior decompression at T12. Stable height loss about the evolving subacute T12 fracture with stable 7 mm bony retropulsion. Residual mild to moderate spinal stenosis at this level, discussed on corresponding thoracic spine MRI. Additional chronic compression deformities involving the inferior endplate of L2 as well as the superior endplates of L3 through L5, stable. No new or interval fracture. Bone marrow signal intensity within normal limits. No worrisome osseous lesions. No evidence for osteomyelitis discitis or septic arthritis. Vertebrae:  L2-3 Conus medullaris and cauda equina: Conus extends to the L2-3 level. Conus and cauda equina appear normal. Paraspinal and other soft tissues: Postoperative changes related to prior fusion noted within the posterior soft tissues about the thoracolumbar junction. Small 3.9 cm collection along the surgical incision felt to be most consistent with a benign postoperative seroma. No other worrisome collections or paraspinous inflammatory changes Disc levels: L1-2:  Negative interspace.  Prior posterior fusion.  No stenosis. L2-3: Disc desiccation without disc bulge. No spinal stenosis. Foramina appear patent. L3-4: Disc desiccation with mild disc bulge. Trace bony retropulsion related to the chronic L4  fracture. Mild bilateral facet hypertrophy. No significant spinal stenosis. Foramina remain patent. L4-5: Disc desiccation with minimal disc bulge. Mild right greater than left facet hypertrophy. No significant spinal stenosis. Foramina remain patent. L5-S1: Degenerate intervertebral disc space narrowing  with disc desiccation. Mild reactive endplate spurring. Small central disc protrusion mildly indents the ventral thecal sac. No significant spinal stenosis. Mild left L5 foraminal narrowing. Right neural foramina remains patent. IMPRESSION: 1. Postoperative changes from prior posterior fusion at T10 through L2, with posterior decompression at T12. Stable height loss and retropulsion about the subacute T12 fracture with residual mild to moderate spinal stenosis. Finding discussed on corresponding thoracic spine MRI. 2. 3.9 cm collection along the posterior surgical incision, felt to be most consistent with a benign postoperative seroma. 3. No other evidence for acute infection within the lumbar spine. 4. Additional chronic compression deformities involving the inferior endplate of L2 as well as the superior endplates of L3 through L5, stable. 5. Mild degenerative spondylosis at L3-4 through L5-S1 without significant stenosis or neural impingement. Electronically Signed   By: Morene Hoard M.D.   On: 01/01/2024 02:44   MR THORACIC SPINE W WO CONTRAST Result Date: 01/01/2024 CLINICAL DATA:  Initial evaluation for acute postoperative infection. EXAM: MRI THORACIC WITHOUT AND WITH CONTRAST TECHNIQUE: Multiplanar and multiecho pulse sequences of the thoracic spine were obtained without and with intravenous contrast. CONTRAST:  9mL GADAVIST  GADOBUTROL  1 MMOL/ML IV SOLN COMPARISON:  Prior studies from 12/31/2023 and 10/25/2023 FINDINGS: Alignment: Straightening of the normal thoracic kyphosis. No listhesis. Vertebrae: Postoperative changes from prior posterior fusion at T10 through L2, with posterior decompression at T12. Stable height loss and 7 mm bony retropulsion about the previously identified subacute T12 fracture. Residual mild to moderate spinal stenosis status post posterior decompression, improved from prior. Additional mild chronic compression deformities involving the superior endplates of T3 and T4  again noted. Vertebral body height otherwise maintained with no acute or interval fracture. Underlying bone marrow signal intensity within normal limits. No worrisome osseous lesions. No evidence for osteomyelitis discitis or septic arthritis. Cord: Persistent mild flattening of the ventral cord at the level of the T12 fracture, however, previously seen cord signal changes are now no longer convincingly seen. Otherwise normal signal and morphology. Paraspinal and other soft tissues: Postoperative scarring within the lower posterior paraspinous soft tissues. Small collection along the surgical incision measures up to 3.9 cm, likely benign seroma (series 21, image 15). No other worrisome collections or paraspinous inflammatory changes. Disc levels: Residual mild to moderate spinal stenosis at the level of T12 as above. Otherwise, no other disc pathology or stenosis seen within the thoracic spine. Foramina remain patent. IMPRESSION: 1. Postoperative changes from prior posterior fusion at T10 through L2, with posterior decompression at T12. Residual mild to moderate spinal stenosis at the level of T12, improved from prior MRI. Persistent mild flattening of the ventral cord at this level, however, previously seen cord signal changes are now no longer seen. 2. Small 3.9 cm collection along the posterior surgical incision, likely benign seroma. No other evidence for acute infection within the thoracic spine. 3. No other acute abnormality. Electronically Signed   By: Morene Hoard M.D.   On: 01/01/2024 02:29   CT ABDOMEN PELVIS WO CONTRAST Result Date: 12/31/2023 CLINICAL DATA:  Abdominal pain fever pain at surgical site EXAM: CT ABDOMEN AND PELVIS WITHOUT CONTRAST TECHNIQUE: Multidetector CT imaging of the abdomen and pelvis was performed following the standard protocol without IV contrast. RADIATION DOSE REDUCTION: This  exam was performed according to the departmental dose-optimization program which includes  automated exposure control, adjustment of the mA and/or kV according to patient size and/or use of iterative reconstruction technique. COMPARISON:  CT 07/08/2023 FINDINGS: Lower chest: Lung bases demonstrate scarring or atelectasis at the right middle lobe and right base. Hepatobiliary: No focal liver abnormality is seen. No gallstones, gallbladder wall thickening, or biliary dilatation. Pancreas: Unremarkable. No pancreatic ductal dilatation or surrounding inflammatory changes. Spleen: Normal in size without focal abnormality. Adrenals/Urinary Tract: Adrenal glands are normal. Nonspecific perinephric stranding. No hydronephrosis. Slightly thick-walled but partially decompressed urinary bladder. Stomach/Bowel: Moderate air distension of stomach. No dilated small bowel. No acute bowel wall thickening. Negative appendix Vascular/Lymphatic: Aortic atherosclerosis. No enlarged abdominal or pelvic lymph nodes. Reproductive: Uterus and bilateral adnexa are unremarkable. Other: Pelvic effusion or free air Musculoskeletal: See separately dictated spine CT. IMPRESSION: 1. No CT evidence for acute intra-abdominal or pelvic abnormality. 2. Slightly thick-walled but partially decompressed urinary bladder. Correlate with urinalysis to exclude cystitis. Mild nonspecific bilateral perinephric fat stranding, slightly increased compared to prior CT, correlate for upper urinary tract infection symptoms. 3. Aortic atherosclerosis. Aortic Atherosclerosis (ICD10-I70.0). Electronically Signed   By: Luke Bun M.D.   On: 12/31/2023 23:24   CT Thoracic Spine Wo Contrast Result Date: 12/31/2023 CLINICAL DATA:  Fever recent spine surgery EXAM: CT THORACIC SPINE WITHOUT CONTRAST TECHNIQUE: Multidetector CT images of the thoracic were obtained using the standard protocol without intravenous contrast. RADIATION DOSE REDUCTION: This exam was performed according to the departmental dose-optimization program which includes automated exposure  control, adjustment of the mA and/or kV according to patient size and/or use of iterative reconstruction technique. COMPARISON:  CT 11/21/2023, 09/11/2023 FINDINGS: Alignment: Normal. Vertebrae: Redemonstrated chronic burst fracture at T12 with mildly sclerotic fracture fragments. Similar 6 mm retropulsion of the vertebral body. Chronic mild superior endplate deformity at T11. Mild chronic superior endplate deformities T2, T3 and T4. These are unchanged. Paraspinal and other soft tissues: Decreased paravertebral edema at T12 level. Mild residual fluid and soft tissue thickening within the subcutaneous midline posterior spinal tissues but overall decreased fluid compared to the CT from July. Aortic atherosclerosis. Disc levels: Chronic facet degenerative changes right-sided at T2-T3. Posterior spinal rods and transpedicular screws T10 through visible upper lumbar spine. Stable appearance of the hardware. No lucency around the hardware. Posterior decompression changes at T12. Bone graft material T10 through T12. Healing bilateral posteromedial rib fractures at T11 and on the right at T12. IMPRESSION: 1. Redemonstrated chronic burst fracture at T12 with similar 6 mm retropulsion of the vertebral body, status post posterior spinal rods from T10 through visible upper lumbar spine with transpedicular screws at T10, T11 and visible L1. Similar intact appearing hardware. Decreased paravertebral edema at T12 level. Mild residual fluid and soft tissue thickening within the subcutaneous midline posterior paraspinal tissues but overall decreased fluid compared to the CT from July. 2. Healing bilateral posteromedial rib fractures at T11 and on the right at T12. 3. Chronic mild superior endplate fractures T2 through T4 and at T11. 4. Aortic atherosclerosis. Aortic Atherosclerosis (ICD10-I70.0). Electronically Signed   By: Luke Bun M.D.   On: 12/31/2023 23:19   CT L-SPINE NO CHARGE Result Date: 12/31/2023 CLINICAL DATA:   Fever tachycardia EXAM: CT Lumbar Spine without contrast TECHNIQUE: Technique: Multiplanar CT images of the lumbar spine were reconstructed from contemporary CT of the Abdomen and Pelvis. RADIATION DOSE REDUCTION: This exam was performed according to the departmental dose-optimization program which includes automated exposure  control, adjustment of the mA and/or kV according to patient size and/or use of iterative reconstruction technique. CONTRAST:  No additional or none COMPARISON:  CT 11/21/2023, MRI 10/25/2023, CT 07/27/2023 FINDINGS: Segmentation: 5 lumbar type vertebrae. Alignment: Normal. Vertebrae: Chronic superior endplate deformity at L3 and moderate chronic superior endplate deformity at L4. Stable inferior endplate compression fracture at L2. Stable superior endplate compression fracture at L5. Redemonstrated bilateral transverse process fractures at L1 and L2, fractures at L2 are better seen on this exam. Paraspinal and other soft tissues: No acute finding. Nonspecific fluid and edema within the subcutaneous soft tissues posterior to the lumbar spine. Disc levels: At T12-L1, posterior decompression. At L1-L2, bilateral transpedicular screws at L1 and L2. Mild nonspecific fluid and soft tissue thickening posteriorly at the midline but overall decreased compared to prior lumbar CT. At L2-L3, patent disc space. No significant canal stenosis or foraminal narrowing. At L3-L4 mild disc osteophyte, partially related to superior L4 endplate deformity and retropulsion. Stable mild canal stenosis. Facet degenerative changes. At L4-L5, no significant canal stenosis. Facet degenerative changes. At L5-S1, disc space narrowing and mild disc bulging. No canal stenosis. Mild facet degenerative changes. Bilateral foraminal narrowing. IMPRESSION: 1. No acute osseous abnormality. 2. Partially visualized postsurgical changes at L1-L2. Mild nonspecific fluid and soft tissue thickening posteriorly at the midline but overall  decreased compared to prior lumbar CT. 3. Chronic compression fractures at L4 and L3. Similar inferior endplate deformity at L2 and superior endplate deformity at L5. No progressive loss of vertebral body heights. 4. Redemonstrated bilateral transverse process fractures at L1 and L2, fractures at L2 are better seen on this exam. Electronically Signed   By: Luke Bun M.D.   On: 12/31/2023 23:04    Assessment/Plan: I reviewed the CT scan of the spine and MRI of the spine.  I have discussed the case with one of my partners Dr. Debby.  We both agree that the scans look good with hardware in good position and no evidence of infection.  There is a postoperative seroma without significant enhancement.  Her wound is well-healed with no redness or tenderness or drainage or other signs of infection.  She is tender to palpation but not along the surgical site but in the lumbar region.  She looks good on exam.  I think she can be discharged to home with plans to follow-up with me on an outpatient basis but I have a low suspicion for spinal infection at this time  Estimated body mass index is 33.31 kg/m as calculated from the following:   Height as of this encounter: 5' 4.5 (1.638 m).   Weight as of this encounter: 89.4 kg.    LOS: 0 days    Alm GORMAN Molt 01/01/2024, 8:29 AM

## 2024-01-01 NOTE — ED Notes (Signed)
 Pt taken to MRI

## 2024-01-01 NOTE — ED Provider Notes (Signed)
 Patient transferred from [facility] for MRI scan to look for evidence of spinal infection.  She is complaining of pain in her mid back at site of recent fusion T10-L2.  No gross neurologic deficit on my exam.  She is requesting pain medication, I am ordering additional hydromorphone .  MRI is pending.  She has continued to request pain medication frequently through the night.  MRI scans showed a 3.9 cm collection along the posterior surgical incision felt to be most consistent with benign postoperative seroma and no other evidence of acute infection within the lumbar or thoracic spine.  I have independently viewed the images, and agree with the radiologist's interpretation.  I have looked at the patient's incision, and it is healing well without any erythema or or swelling or warmth.  I have discussed the case with the PA on-call for neurosurgery and she will be discussing with her surgeon Dr. Joshua who is going to review the scans and get back to us  regarding a plan.  Case signed out to Dr. Levander.  Results for orders placed or performed during the hospital encounter of 12/31/23  Protime-INR   Collection Time: 12/31/23  7:59 PM  Result Value Ref Range   Prothrombin Time 15.0 11.4 - 15.2 seconds   INR 1.1 0.8 - 1.2  Comprehensive metabolic panel   Collection Time: 12/31/23  8:00 PM  Result Value Ref Range   Sodium 142 135 - 145 mmol/L   Potassium 3.4 (L) 3.5 - 5.1 mmol/L   Chloride 99 98 - 111 mmol/L   CO2 26 22 - 32 mmol/L   Glucose, Bld 119 (H) 70 - 99 mg/dL   BUN <5 (L) 6 - 20 mg/dL   Creatinine, Ser 9.25 0.44 - 1.00 mg/dL   Calcium  9.8 8.9 - 10.3 mg/dL   Total Protein 8.8 (H) 6.5 - 8.1 g/dL   Albumin  4.2 3.5 - 5.0 g/dL   AST 19 15 - 41 U/L   ALT 15 0 - 44 U/L   Alkaline Phosphatase 127 (H) 38 - 126 U/L   Total Bilirubin 0.6 0.0 - 1.2 mg/dL   GFR, Estimated >39 >39 mL/min   Anion gap 17 (H) 5 - 15  CBC with Differential   Collection Time: 12/31/23  8:00 PM  Result Value Ref  Range   WBC 13.7 (H) 4.0 - 10.5 K/uL   RBC 5.50 (H) 3.87 - 5.11 MIL/uL   Hemoglobin 13.7 12.0 - 15.0 g/dL   HCT 53.5 (H) 63.9 - 53.9 %   MCV 84.4 80.0 - 100.0 fL   MCH 24.9 (L) 26.0 - 34.0 pg   MCHC 29.5 (L) 30.0 - 36.0 g/dL   RDW 83.5 (H) 88.4 - 84.4 %   Platelets 514 (H) 150 - 400 K/uL   nRBC 0.0 0.0 - 0.2 %   Neutrophils Relative % 83 %   Neutro Abs 11.2 (H) 1.7 - 7.7 K/uL   Lymphocytes Relative 10 %   Lymphs Abs 1.4 0.7 - 4.0 K/uL   Monocytes Relative 7 %   Monocytes Absolute 1.0 0.1 - 1.0 K/uL   Eosinophils Relative 0 %   Eosinophils Absolute 0.0 0.0 - 0.5 K/uL   Basophils Relative 0 %   Basophils Absolute 0.1 0.0 - 0.1 K/uL   Immature Granulocytes 0 %   Abs Immature Granulocytes 0.06 0.00 - 0.07 K/uL  I-Stat Lactic Acid, ED   Collection Time: 12/31/23  8:10 PM  Result Value Ref Range   Lactic Acid, Venous  2.3 (HH) 0.5 - 1.9 mmol/L   Comment NOTIFIED PHYSICIAN   I-Stat Lactic Acid, ED   Collection Time: 12/31/23 10:13 PM  Result Value Ref Range   Lactic Acid, Venous 1.7 0.5 - 1.9 mmol/L  Urinalysis, w/ Reflex to Culture (Infection Suspected) -Urine, Clean Catch   Collection Time: 12/31/23 11:11 PM  Result Value Ref Range   Specimen Source URINE, CATHETERIZED    Color, Urine AMBER (A) YELLOW   APPearance HAZY (A) CLEAR   Specific Gravity, Urine 1.025 1.005 - 1.030   pH 6.0 5.0 - 8.0   Glucose, UA NEGATIVE NEGATIVE mg/dL   Hgb urine dipstick NEGATIVE NEGATIVE   Bilirubin Urine MODERATE (A) NEGATIVE   Ketones, ur 20 (A) NEGATIVE mg/dL   Protein, ur 899 (A) NEGATIVE mg/dL   Nitrite NEGATIVE NEGATIVE   Leukocytes,Ua TRACE (A) NEGATIVE   RBC / HPF 0-5 0 - 5 RBC/hpf   WBC, UA 11-20 0 - 5 WBC/hpf   Bacteria, UA NONE SEEN NONE SEEN   Squamous Epithelial / HPF 11-20 0 - 5 /HPF   Mucus PRESENT    Hyaline Casts, UA PRESENT    MR Lumbar Spine W Wo Contrast Result Date: 01/01/2024 CLINICAL DATA:  Initial evaluation for acute postoperative infection. EXAM: MRI LUMBAR  SPINE WITHOUT AND WITH CONTRAST TECHNIQUE: Multiplanar and multiecho pulse sequences of the lumbar spine were obtained without and with intravenous contrast. CONTRAST:  9mL GADAVIST  GADOBUTROL  1 MMOL/ML IV SOLN COMPARISON:  Comparison made with prior CT from 12/31/2023 and MRI from 10/25/2023 FINDINGS: Segmentation: Standard. Lowest well-formed disc space labeled the L5-S1 level. Alignment: Postoperative changes from prior posterior fusion at T10 through L2, with posterior decompression at T12. Stable height loss about the evolving subacute T12 fracture with stable 7 mm bony retropulsion. Residual mild to moderate spinal stenosis at this level, discussed on corresponding thoracic spine MRI. Additional chronic compression deformities involving the inferior endplate of L2 as well as the superior endplates of L3 through L5, stable. No new or interval fracture. Bone marrow signal intensity within normal limits. No worrisome osseous lesions. No evidence for osteomyelitis discitis or septic arthritis. Vertebrae:  L2-3 Conus medullaris and cauda equina: Conus extends to the L2-3 level. Conus and cauda equina appear normal. Paraspinal and other soft tissues: Postoperative changes related to prior fusion noted within the posterior soft tissues about the thoracolumbar junction. Small 3.9 cm collection along the surgical incision felt to be most consistent with a benign postoperative seroma. No other worrisome collections or paraspinous inflammatory changes Disc levels: L1-2:  Negative interspace.  Prior posterior fusion.  No stenosis. L2-3: Disc desiccation without disc bulge. No spinal stenosis. Foramina appear patent. L3-4: Disc desiccation with mild disc bulge. Trace bony retropulsion related to the chronic L4 fracture. Mild bilateral facet hypertrophy. No significant spinal stenosis. Foramina remain patent. L4-5: Disc desiccation with minimal disc bulge. Mild right greater than left facet hypertrophy. No significant  spinal stenosis. Foramina remain patent. L5-S1: Degenerate intervertebral disc space narrowing with disc desiccation. Mild reactive endplate spurring. Small central disc protrusion mildly indents the ventral thecal sac. No significant spinal stenosis. Mild left L5 foraminal narrowing. Right neural foramina remains patent. IMPRESSION: 1. Postoperative changes from prior posterior fusion at T10 through L2, with posterior decompression at T12. Stable height loss and retropulsion about the subacute T12 fracture with residual mild to moderate spinal stenosis. Finding discussed on corresponding thoracic spine MRI. 2. 3.9 cm collection along the posterior surgical incision, felt to be most consistent with  a benign postoperative seroma. 3. No other evidence for acute infection within the lumbar spine. 4. Additional chronic compression deformities involving the inferior endplate of L2 as well as the superior endplates of L3 through L5, stable. 5. Mild degenerative spondylosis at L3-4 through L5-S1 without significant stenosis or neural impingement. Electronically Signed   By: Morene Hoard M.D.   On: 01/01/2024 02:44   MR THORACIC SPINE W WO CONTRAST Result Date: 01/01/2024 CLINICAL DATA:  Initial evaluation for acute postoperative infection. EXAM: MRI THORACIC WITHOUT AND WITH CONTRAST TECHNIQUE: Multiplanar and multiecho pulse sequences of the thoracic spine were obtained without and with intravenous contrast. CONTRAST:  9mL GADAVIST  GADOBUTROL  1 MMOL/ML IV SOLN COMPARISON:  Prior studies from 12/31/2023 and 10/25/2023 FINDINGS: Alignment: Straightening of the normal thoracic kyphosis. No listhesis. Vertebrae: Postoperative changes from prior posterior fusion at T10 through L2, with posterior decompression at T12. Stable height loss and 7 mm bony retropulsion about the previously identified subacute T12 fracture. Residual mild to moderate spinal stenosis status post posterior decompression, improved from prior.  Additional mild chronic compression deformities involving the superior endplates of T3 and T4 again noted. Vertebral body height otherwise maintained with no acute or interval fracture. Underlying bone marrow signal intensity within normal limits. No worrisome osseous lesions. No evidence for osteomyelitis discitis or septic arthritis. Cord: Persistent mild flattening of the ventral cord at the level of the T12 fracture, however, previously seen cord signal changes are now no longer convincingly seen. Otherwise normal signal and morphology. Paraspinal and other soft tissues: Postoperative scarring within the lower posterior paraspinous soft tissues. Small collection along the surgical incision measures up to 3.9 cm, likely benign seroma (series 21, image 15). No other worrisome collections or paraspinous inflammatory changes. Disc levels: Residual mild to moderate spinal stenosis at the level of T12 as above. Otherwise, no other disc pathology or stenosis seen within the thoracic spine. Foramina remain patent. IMPRESSION: 1. Postoperative changes from prior posterior fusion at T10 through L2, with posterior decompression at T12. Residual mild to moderate spinal stenosis at the level of T12, improved from prior MRI. Persistent mild flattening of the ventral cord at this level, however, previously seen cord signal changes are now no longer seen. 2. Small 3.9 cm collection along the posterior surgical incision, likely benign seroma. No other evidence for acute infection within the thoracic spine. 3. No other acute abnormality. Electronically Signed   By: Morene Hoard M.D.   On: 01/01/2024 02:29   CT ABDOMEN PELVIS WO CONTRAST Result Date: 12/31/2023 CLINICAL DATA:  Abdominal pain fever pain at surgical site EXAM: CT ABDOMEN AND PELVIS WITHOUT CONTRAST TECHNIQUE: Multidetector CT imaging of the abdomen and pelvis was performed following the standard protocol without IV contrast. RADIATION DOSE REDUCTION: This  exam was performed according to the departmental dose-optimization program which includes automated exposure control, adjustment of the mA and/or kV according to patient size and/or use of iterative reconstruction technique. COMPARISON:  CT 07/08/2023 FINDINGS: Lower chest: Lung bases demonstrate scarring or atelectasis at the right middle lobe and right base. Hepatobiliary: No focal liver abnormality is seen. No gallstones, gallbladder wall thickening, or biliary dilatation. Pancreas: Unremarkable. No pancreatic ductal dilatation or surrounding inflammatory changes. Spleen: Normal in size without focal abnormality. Adrenals/Urinary Tract: Adrenal glands are normal. Nonspecific perinephric stranding. No hydronephrosis. Slightly thick-walled but partially decompressed urinary bladder. Stomach/Bowel: Moderate air distension of stomach. No dilated small bowel. No acute bowel wall thickening. Negative appendix Vascular/Lymphatic: Aortic atherosclerosis. No enlarged abdominal or pelvic  lymph nodes. Reproductive: Uterus and bilateral adnexa are unremarkable. Other: Pelvic effusion or free air Musculoskeletal: See separately dictated spine CT. IMPRESSION: 1. No CT evidence for acute intra-abdominal or pelvic abnormality. 2. Slightly thick-walled but partially decompressed urinary bladder. Correlate with urinalysis to exclude cystitis. Mild nonspecific bilateral perinephric fat stranding, slightly increased compared to prior CT, correlate for upper urinary tract infection symptoms. 3. Aortic atherosclerosis. Aortic Atherosclerosis (ICD10-I70.0). Electronically Signed   By: Luke Bun M.D.   On: 12/31/2023 23:24   CT Thoracic Spine Wo Contrast Result Date: 12/31/2023 CLINICAL DATA:  Fever recent spine surgery EXAM: CT THORACIC SPINE WITHOUT CONTRAST TECHNIQUE: Multidetector CT images of the thoracic were obtained using the standard protocol without intravenous contrast. RADIATION DOSE REDUCTION: This exam was performed  according to the departmental dose-optimization program which includes automated exposure control, adjustment of the mA and/or kV according to patient size and/or use of iterative reconstruction technique. COMPARISON:  CT 11/21/2023, 09/11/2023 FINDINGS: Alignment: Normal. Vertebrae: Redemonstrated chronic burst fracture at T12 with mildly sclerotic fracture fragments. Similar 6 mm retropulsion of the vertebral body. Chronic mild superior endplate deformity at T11. Mild chronic superior endplate deformities T2, T3 and T4. These are unchanged. Paraspinal and other soft tissues: Decreased paravertebral edema at T12 level. Mild residual fluid and soft tissue thickening within the subcutaneous midline posterior spinal tissues but overall decreased fluid compared to the CT from July. Aortic atherosclerosis. Disc levels: Chronic facet degenerative changes right-sided at T2-T3. Posterior spinal rods and transpedicular screws T10 through visible upper lumbar spine. Stable appearance of the hardware. No lucency around the hardware. Posterior decompression changes at T12. Bone graft material T10 through T12. Healing bilateral posteromedial rib fractures at T11 and on the right at T12. IMPRESSION: 1. Redemonstrated chronic burst fracture at T12 with similar 6 mm retropulsion of the vertebral body, status post posterior spinal rods from T10 through visible upper lumbar spine with transpedicular screws at T10, T11 and visible L1. Similar intact appearing hardware. Decreased paravertebral edema at T12 level. Mild residual fluid and soft tissue thickening within the subcutaneous midline posterior paraspinal tissues but overall decreased fluid compared to the CT from July. 2. Healing bilateral posteromedial rib fractures at T11 and on the right at T12. 3. Chronic mild superior endplate fractures T2 through T4 and at T11. 4. Aortic atherosclerosis. Aortic Atherosclerosis (ICD10-I70.0). Electronically Signed   By: Luke Bun M.D.    On: 12/31/2023 23:19   CT L-SPINE NO CHARGE Result Date: 12/31/2023 CLINICAL DATA:  Fever tachycardia EXAM: CT Lumbar Spine without contrast TECHNIQUE: Technique: Multiplanar CT images of the lumbar spine were reconstructed from contemporary CT of the Abdomen and Pelvis. RADIATION DOSE REDUCTION: This exam was performed according to the departmental dose-optimization program which includes automated exposure control, adjustment of the mA and/or kV according to patient size and/or use of iterative reconstruction technique. CONTRAST:  No additional or none COMPARISON:  CT 11/21/2023, MRI 10/25/2023, CT 07/27/2023 FINDINGS: Segmentation: 5 lumbar type vertebrae. Alignment: Normal. Vertebrae: Chronic superior endplate deformity at L3 and moderate chronic superior endplate deformity at L4. Stable inferior endplate compression fracture at L2. Stable superior endplate compression fracture at L5. Redemonstrated bilateral transverse process fractures at L1 and L2, fractures at L2 are better seen on this exam. Paraspinal and other soft tissues: No acute finding. Nonspecific fluid and edema within the subcutaneous soft tissues posterior to the lumbar spine. Disc levels: At T12-L1, posterior decompression. At L1-L2, bilateral transpedicular screws at L1 and L2. Mild nonspecific fluid and  soft tissue thickening posteriorly at the midline but overall decreased compared to prior lumbar CT. At L2-L3, patent disc space. No significant canal stenosis or foraminal narrowing. At L3-L4 mild disc osteophyte, partially related to superior L4 endplate deformity and retropulsion. Stable mild canal stenosis. Facet degenerative changes. At L4-L5, no significant canal stenosis. Facet degenerative changes. At L5-S1, disc space narrowing and mild disc bulging. No canal stenosis. Mild facet degenerative changes. Bilateral foraminal narrowing. IMPRESSION: 1. No acute osseous abnormality. 2. Partially visualized postsurgical changes at L1-L2.  Mild nonspecific fluid and soft tissue thickening posteriorly at the midline but overall decreased compared to prior lumbar CT. 3. Chronic compression fractures at L4 and L3. Similar inferior endplate deformity at L2 and superior endplate deformity at L5. No progressive loss of vertebral body heights. 4. Redemonstrated bilateral transverse process fractures at L1 and L2, fractures at L2 are better seen on this exam. Electronically Signed   By: Luke Bun M.D.   On: 12/31/2023 23:04      Raford Lenis, MD 01/01/24 [phone]

## 2024-01-01 NOTE — ED Notes (Signed)
 Pt back from MRI

## 2024-01-01 NOTE — ED Notes (Signed)
 Pt brought over from [facility] for a MRI of her back. Pt requesting pain medicine.

## 2024-01-01 NOTE — ED Provider Notes (Signed)
  Physical Exam  BP (!) 151/109 (BP Location: Right Arm)   Pulse (!) 118   Temp 98.3 F (36.8 C)   Resp 15   Ht 1.638 m (5' 4.5)   Wt 89.4 kg   LMP 06/03/2018 (Approximate) Comment: neg hcg 05/10/20  SpO2 96%   BMI 33.31 kg/m   Physical Exam  Procedures  Procedures  ED Course / MDM    Medical Decision Making Amount and/or Complexity of Data Reviewed Radiology: ordered.  Risk Prescription drug management.   53 year old female status post thoracolumbar fusion by Dr. Alm Molt.  She was seen yesterday at [facility] and sent to the [facility] ED for MRI.  Here she has required repeat pain medication.  She is signed out to me pending Dr. Molt consultation. Dr. Molt is seen and consulted on patient.  Please see his full note.  He feels that she is cleared for discharge home. Patient is resting in the hallway at this time.  She is aware of the plan and is in agreement with plan. She appears to be stable for discharge Tachycardia is noted and thought to be related to her pain.        Levander Houston, MD 01/01/24 [phone]

## 2024-01-01 NOTE — Discharge Instructions (Signed)
 Please continue home medications as per Dr. Joshua instructions and follow-up as per his plan. You may return to the emergency department anytime if you are having new or worsening symptoms

## 2024-01-02 ENCOUNTER — Other Ambulatory Visit (HOSPITAL_COMMUNITY): Payer: Self-pay

## 2024-01-06 ENCOUNTER — Encounter: Payer: Self-pay | Admitting: Internal Medicine

## 2024-01-06 ENCOUNTER — Other Ambulatory Visit (HOSPITAL_COMMUNITY): Payer: Self-pay

## 2024-01-06 LAB — CULTURE, BLOOD (ROUTINE X 2)
Culture: NO GROWTH
Special Requests: ADEQUATE

## 2024-01-08 ENCOUNTER — Other Ambulatory Visit (HOSPITAL_COMMUNITY): Payer: Self-pay

## 2024-01-10 ENCOUNTER — Encounter (HOSPITAL_COMMUNITY): Payer: Self-pay

## 2024-01-10 ENCOUNTER — Other Ambulatory Visit (HOSPITAL_COMMUNITY): Payer: Self-pay

## 2024-01-21 ENCOUNTER — Other Ambulatory Visit (HOSPITAL_COMMUNITY): Payer: Self-pay

## 2024-01-24 ENCOUNTER — Encounter (HOSPITAL_COMMUNITY): Payer: Self-pay

## 2024-01-24 ENCOUNTER — Emergency Department (HOSPITAL_COMMUNITY)
Admission: EM | Admit: 2024-01-24 | Discharge: 2024-01-24 | Disposition: A | Discharge status: Home / Self Care | Attending: Emergency Medicine | Admitting: Emergency Medicine

## 2024-01-24 ENCOUNTER — Other Ambulatory Visit: Payer: Self-pay

## 2024-01-24 ENCOUNTER — Encounter: Payer: Self-pay | Admitting: Internal Medicine

## 2024-01-24 ENCOUNTER — Emergency Department (HOSPITAL_COMMUNITY)

## 2024-01-24 ENCOUNTER — Other Ambulatory Visit (HOSPITAL_COMMUNITY): Payer: Self-pay

## 2024-01-24 DIAGNOSIS — W19XXXA Unspecified fall, initial encounter: Secondary | ICD-10-CM

## 2024-01-24 DIAGNOSIS — W182XXA Fall in (into) shower or empty bathtub, initial encounter: Secondary | ICD-10-CM | POA: Diagnosis not present

## 2024-01-24 DIAGNOSIS — M545 Low back pain, unspecified: Secondary | ICD-10-CM | POA: Diagnosis present

## 2024-01-24 DIAGNOSIS — M5442 Lumbago with sciatica, left side: Secondary | ICD-10-CM | POA: Insufficient documentation

## 2024-01-24 MED ORDER — RENAL VITAMIN 0.8 MG PO TABS
1.0000 | ORAL_TABLET | Freq: Every day | ORAL | 1 refills | Status: AC
  Filled 2024-01-24: qty 90, 90d supply, fill #0
  Filled 2024-04-26: qty 90, 90d supply, fill #1

## 2024-01-24 MED ORDER — OXYCODONE HCL 15 MG PO TABS
15.0000 mg | ORAL_TABLET | Freq: Four times a day (QID) | ORAL | 0 refills | Status: DC | PRN
  Filled 2024-01-24: qty 120, 30d supply, fill #0

## 2024-01-24 MED ORDER — OXYCODONE HCL 5 MG PO TABS
15.0000 mg | ORAL_TABLET | Freq: Once | ORAL | Status: AC
  Administered 2024-01-24: 15 mg via ORAL
  Filled 2024-01-24: qty 3

## 2024-01-24 MED ORDER — ACETAMINOPHEN 500 MG PO TABS
1000.0000 mg | ORAL_TABLET | Freq: Once | ORAL | Status: AC
  Administered 2024-01-24: 1000 mg via ORAL
  Filled 2024-01-24: qty 2

## 2024-01-24 NOTE — ED Triage Notes (Signed)
 Pt had back surgery 7 weeks ago, fell out of bathtub yesterday and is having worsening back pain and left calf pain since fall. No head injury, no LOC, no blood thinners.

## 2024-01-24 NOTE — Discharge Instructions (Addendum)
 1.  Your x-rays do not show any movement of your hardware.  Appear most likely to have some soft tissue strain and some sciatica. 2.  Continue your chronically prescribed pain medications. 3.  You may use a topical pain patch such as Lidoderm .  You may also apply cool compresses. 4.  Call your spine specialist to schedule a recheck.

## 2024-01-24 NOTE — ED Provider Notes (Signed)
 [place] EMERGENCY DEPARTMENT AT [facility] Provider Note   CSN: [id number] Arrival date & time: 01/24/24  8171     Patient presents with: Back Pain   [name] is a 53 y.o. female.   HPI Patient is status post thoracolumbar fusion ORIF T12 fracture by Dr. Alm Molt 11/08/23.  Yesterday she fell getting out of her bathtub.  She reports she landed on her buttocks and lower back.  Patient reports she has been ambulatory since then but it is very painful.  She has a lot of pain in her low back and some tingling and pain radiating into the left leg.    Prior to Admission medications   Medication Sig Start Date End Date Taking? Authorizing Provider  acetaminophen  (TYLENOL ) 650 MG CR tablet Take 1,300 mg by mouth every 8 (eight) hours as needed for pain.    [provider]  ARIPiprazole  (ABILIFY ) 10 MG tablet Take 1 tablet (10 mg total) by mouth daily. 10/16/23     ARIPiprazole  (ABILIFY ) 10 MG tablet Take 1 tablet (10 mg total) by mouth daily. 11/15/23     ARIPiprazole  (ABILIFY ) 10 MG tablet 1 tablet by mouth daily 12/18/23     atorvastatin  (LIPITOR) 20 MG tablet Take 1 tablet (20 mg total) by mouth daily. 09/28/23     B Complex-C-Folic Acid  (RENAL VITAMIN) 0.8 MG TABS Take 1 tablet by mouth daily. 01/24/24     carvedilol  (COREG ) 6.25 MG tablet Take 1 tablet (6.25 mg total) by mouth 2 (two) times daily with a meal. 10/26/23   Kathrin Mignon DASEN, MD  carvedilol  (COREG ) 6.25 MG tablet Take 1 tablet (6.25 mg total) by mouth 2 (two) times daily. 11/14/23     clonazePAM  (KLONOPIN ) 0.5 MG tablet Take 1 tablet (0.5 mg total) by mouth daily as needed for severe breakthrough panic/anxiety 12/18/23     doxycycline  (VIBRA -TABS) 100 MG tablet Take 1 tablet (100 mg total) by mouth 2 (two) times daily. 11/25/23     DULoxetine  (CYMBALTA ) 60 MG capsule Take 1 capsule (60 mg total) by mouth 2 (two) times daily. 10/16/23     DULoxetine  (CYMBALTA ) 60 MG capsule Take 1 capsule (60 mg total) by mouth  2 (two) times daily. 11/15/23     DULoxetine  (CYMBALTA ) 60 MG capsule Take 1 capsule (60 mg total) by mouth 2 (two) times daily. 12/18/23     ibuprofen  (ADVIL ) 200 MG tablet Take 400 mg by mouth daily as needed for moderate pain (pain score 4-6) or mild pain (pain score 1-3).    [provider]  levothyroxine  (SYNTHROID ) 50 MCG tablet Take 50 mcg by mouth daily before breakfast.    [provider]  lidocaine  (LIDODERM ) 5 % Place 1 patch onto the skin daily. Remove & Discard patch within 12 hours or as directed by MD 10/11/23   Rai, Nydia POUR, MD  metoCLOPramide  (REGLAN ) 5 MG tablet Take 1 tablet (5 mg total) by mouth every 8 (eight) hours as needed for nausea or vomiting. 10/11/23   Rai, Nydia POUR, MD  multivitamin (RENA-VIT) TABS tablet Take 1 tablet by mouth at bedtime. 08/01/20   Tobie Burgess Opoka, MD  ondansetron  (ZOFRAN -ODT) 8 MG disintegrating tablet Take 1 tablet (8 mg total) by mouth every 8 (eight) hours as needed for nausea and vomiting. 11/29/23     oxyCODONE  (ROXICODONE ) 15 MG immediate release tablet Take 1 tablet (15 mg total) by mouth every 6 (six) hours as needed. 11/29/23  oxyCODONE  (ROXICODONE ) 15 MG immediate release tablet Take 1 tablet (15 mg total) by mouth 4 (four) times daily as needed. 11/29/23     oxyCODONE  (ROXICODONE ) 15 MG immediate release tablet Take 1 tablet (15 mg total) by mouth 4 (four) times daily as needed. 12/27/23     oxyCODONE  (ROXICODONE ) 15 MG immediate release tablet Take 1 tablet (15 mg total) by mouth 4 (four) times daily as needed. 01/24/24     pantoprazole  (PROTONIX ) 40 MG tablet Take 1 tablet by mouth twice daily 09/21/22   Kerman Vina HERO, NP  prazosin  (MINIPRESS ) 5 MG capsule Take 1 capsule (5 mg total) by mouth at bedtime. 10/16/23     prazosin  (MINIPRESS ) 5 MG capsule Take 1 capsule (5 mg total) by mouth at bedtime. 11/15/23     prazosin  (MINIPRESS ) 5 MG capsule Take 1 capsule (5 mg total) by mouth at bedtime. 12/18/23     pregabalin  (LYRICA )  50 MG capsule Take 50 mg by mouth 2 (two) times daily. 07/10/23   [provider]  PROLIA 60 MG/ML SOSY injection Inject 60 mg into the skin every 6 (six) months. 09/30/23   [provider]  promethazine  (PHENERGAN ) 25 MG tablet Take 1 tablet (25 mg total) by mouth 3 (three) times daily as needed. 12/11/23     tirzepatide  (MOUNJARO ) 10 MG/0.5ML Pen Inject 10 mg into the skin once a week. 11/29/23     tirzepatide  (MOUNJARO ) 7.5 MG/0.5ML Pen Inject 7.5 mg into the skin once a week. 10/22/23     tiZANidine  (ZANAFLEX ) 4 MG tablet Take 1 tablet (4 mg total) by mouth every 6 (six) hours as needed. 10/22/23     tiZANidine  (ZANAFLEX ) 4 MG tablet Take 1 tablet (4 mg total) by mouth every 6 (six) hours as needed. 11/29/23     tiZANidine  (ZANAFLEX ) 4 MG tablet Take 1 tablet (4 mg total) by mouth every 8 (eight) hours as needed for pain. 12/30/23     trazodone  (DESYREL ) 300 MG tablet Take 2 tablets (600 mg total) by mouth at bedtime. 10/16/23     trazodone  (DESYREL ) 300 MG tablet Take 2 tablets (600 mg total) by mouth at bedtime. 11/15/23     trazodone  (DESYREL ) 300 MG tablet Take 2 tablets (600 mg total) by mouth at bedtime. 12/18/23     Vitamin D , Ergocalciferol , (DRISDOL ) 1.25 MG (50000 UNIT) CAPS capsule Take 1 capsule (50,000 Units total) by mouth every 7 (seven) days. 11/01/23   Gonfa, Taye T, MD    Allergies: Patient has no known allergies.    Review of Systems  Updated Vital Signs BP 118/86   Pulse 99   Temp 98.4 F (36.9 C) (Oral)   Resp 16   Ht 5' 4.5 (1.638 m)   Wt 87.1 kg   LMP 06/03/2018 (Approximate) Comment: neg hcg 05/10/20  SpO2 100%   BMI 32.45 kg/m   Physical Exam Constitutional:      Comments: Alert nontoxic.  Clear mental status.  No respiratory distress.  No acute distress.  HENT:     Head: Normocephalic and atraumatic.     Mouth/Throat:     Pharynx: Oropharynx is clear.  Eyes:     Extraocular Movements: Extraocular movements intact.  Cardiovascular:     Rate and  Rhythm: Normal rate and regular rhythm.  Pulmonary:     Effort: Pulmonary effort is normal.     Breath sounds: Normal breath sounds.  Abdominal:     General: There is no distension.  Palpations: Abdomen is soft.     Tenderness: There is no abdominal tenderness. There is no guarding.  Musculoskeletal:     Comments: Patient endorses some tenderness to the left SI region and lower lumbar region.  No visible contusions or abrasions.  Lower extremity deformities.  Lower extremities are symmetric.  Calves are soft and pliable.  Feet are symmetric warm and dry.  Patient is able to go from lying on her side supine to flat on her back and has intact mobility in the stretcher.  Patient is using both lower extremities for repositioning.  Skin:    General: Skin is warm and dry.  Neurological:     Comments: Alert with clear mental status.  Lower extremity intact strength 4 movement in the stretcher and dorsiflexion plantarflexion bilateral lower extremities.     (all labs ordered are listed, but only abnormal results are displayed) Labs Reviewed - No data to display  EKG: None  Radiology: No results found.   Procedures   Medications Ordered in the ED - No data to display                                  Medical Decision Making Amount and/or Complexity of Data Reviewed Radiology: ordered.  Risk OTC drugs. Prescription drug management.   Patient had a fall yesterday mechanical in nature.  She has had prior back surgery.  Patient reports pain is worse in the back and worse with weightbearing.  Patient has been ambulatory.  Will proceed with x-rays of lumbar spine and pelvis.  Patient request pain medication.  She does take Oxy codon immediate release 15 mg for home pain control.  Will administer a dose in the emergency department.   X-rays did not show any movement of hardware or acute fracture.  At this time, patient is neurologically intact.  X-rays do not show acute injury.   Patient has been ambulatory.  She has been treated with her home oxycodone  dose.  At this time stable for discharge with follow-up on outpatient basis with her spine specialist and continue baseline chronic home pain management.    Final diagnoses:  Fall, initial encounter  Left-sided low back pain with left-sided sciatica, unspecified chronicity    ED Discharge Orders     None          Armenta Canning, MD 02/03/24 1350

## 2024-01-27 ENCOUNTER — Other Ambulatory Visit (HOSPITAL_COMMUNITY): Payer: Self-pay

## 2024-01-27 ENCOUNTER — Other Ambulatory Visit: Payer: Self-pay

## 2024-01-28 ENCOUNTER — Other Ambulatory Visit (HOSPITAL_COMMUNITY): Payer: Self-pay

## 2024-01-30 ENCOUNTER — Other Ambulatory Visit (HOSPITAL_COMMUNITY): Payer: Self-pay

## 2024-01-30 ENCOUNTER — Other Ambulatory Visit: Payer: Self-pay

## 2024-01-30 ENCOUNTER — Encounter: Payer: Self-pay | Admitting: Internal Medicine

## 2024-02-01 ENCOUNTER — Other Ambulatory Visit (HOSPITAL_COMMUNITY): Payer: Self-pay

## 2024-02-03 ENCOUNTER — Other Ambulatory Visit: Payer: Self-pay

## 2024-02-04 ENCOUNTER — Other Ambulatory Visit (HOSPITAL_COMMUNITY): Payer: Self-pay

## 2024-02-05 ENCOUNTER — Other Ambulatory Visit (HOSPITAL_COMMUNITY): Payer: Self-pay

## 2024-02-06 ENCOUNTER — Other Ambulatory Visit (HOSPITAL_COMMUNITY): Payer: Self-pay

## 2024-02-10 ENCOUNTER — Other Ambulatory Visit (HOSPITAL_COMMUNITY): Payer: Self-pay

## 2024-02-10 MED ORDER — MOUNJARO 10 MG/0.5ML ~~LOC~~ SOAJ
10.0000 mg | SUBCUTANEOUS | 1 refills | Status: DC
  Filled 2024-02-10: qty 2, 28d supply, fill #0

## 2024-02-11 ENCOUNTER — Inpatient Hospital Stay (HOSPITAL_COMMUNITY)
Admission: EM | Admit: 2024-02-11 | Discharge: 2024-02-13 | DRG: 394 | Disposition: A | Discharge status: Home / Self Care | Attending: Internal Medicine | Admitting: Internal Medicine

## 2024-02-11 ENCOUNTER — Encounter (HOSPITAL_COMMUNITY): Payer: Self-pay

## 2024-02-11 ENCOUNTER — Other Ambulatory Visit (HOSPITAL_COMMUNITY): Payer: Self-pay

## 2024-02-11 ENCOUNTER — Other Ambulatory Visit: Payer: Self-pay

## 2024-02-11 ENCOUNTER — Emergency Department (HOSPITAL_COMMUNITY)

## 2024-02-11 DIAGNOSIS — Z6833 Body mass index (BMI) 33.0-33.9, adult: Secondary | ICD-10-CM | POA: Diagnosis not present

## 2024-02-11 DIAGNOSIS — K921 Melena: Secondary | ICD-10-CM | POA: Diagnosis present

## 2024-02-11 DIAGNOSIS — G894 Chronic pain syndrome: Secondary | ICD-10-CM | POA: Diagnosis present

## 2024-02-11 DIAGNOSIS — Z85828 Personal history of other malignant neoplasm of skin: Secondary | ICD-10-CM

## 2024-02-11 DIAGNOSIS — K922 Gastrointestinal hemorrhage, unspecified: Principal | ICD-10-CM

## 2024-02-11 DIAGNOSIS — K562 Volvulus: Secondary | ICD-10-CM | POA: Diagnosis present

## 2024-02-11 DIAGNOSIS — Z833 Family history of diabetes mellitus: Secondary | ICD-10-CM

## 2024-02-11 DIAGNOSIS — E785 Hyperlipidemia, unspecified: Secondary | ICD-10-CM | POA: Diagnosis present

## 2024-02-11 DIAGNOSIS — Z860101 Personal history of adenomatous and serrated colon polyps: Secondary | ICD-10-CM

## 2024-02-11 DIAGNOSIS — K642 Third degree hemorrhoids: Secondary | ICD-10-CM | POA: Diagnosis present

## 2024-02-11 DIAGNOSIS — Z560 Unemployment, unspecified: Secondary | ICD-10-CM

## 2024-02-11 DIAGNOSIS — Z981 Arthrodesis status: Secondary | ICD-10-CM

## 2024-02-11 DIAGNOSIS — Z803 Family history of malignant neoplasm of breast: Secondary | ICD-10-CM

## 2024-02-11 DIAGNOSIS — F319 Bipolar disorder, unspecified: Secondary | ICD-10-CM | POA: Diagnosis present

## 2024-02-11 DIAGNOSIS — R1030 Lower abdominal pain, unspecified: Secondary | ICD-10-CM | POA: Diagnosis not present

## 2024-02-11 DIAGNOSIS — E039 Hypothyroidism, unspecified: Secondary | ICD-10-CM | POA: Diagnosis present

## 2024-02-11 DIAGNOSIS — E66811 Obesity, class 1: Secondary | ICD-10-CM | POA: Diagnosis present

## 2024-02-11 DIAGNOSIS — Z79899 Other long term (current) drug therapy: Secondary | ICD-10-CM | POA: Diagnosis not present

## 2024-02-11 DIAGNOSIS — E119 Type 2 diabetes mellitus without complications: Secondary | ICD-10-CM | POA: Diagnosis present

## 2024-02-11 DIAGNOSIS — K760 Fatty (change of) liver, not elsewhere classified: Secondary | ICD-10-CM | POA: Diagnosis present

## 2024-02-11 DIAGNOSIS — Z1211 Encounter for screening for malignant neoplasm of colon: Secondary | ICD-10-CM | POA: Diagnosis not present

## 2024-02-11 DIAGNOSIS — K5900 Constipation, unspecified: Secondary | ICD-10-CM | POA: Diagnosis present

## 2024-02-11 DIAGNOSIS — K6289 Other specified diseases of anus and rectum: Secondary | ICD-10-CM | POA: Diagnosis present

## 2024-02-11 DIAGNOSIS — Z83719 Family history of colon polyps, unspecified: Secondary | ICD-10-CM

## 2024-02-11 DIAGNOSIS — K219 Gastro-esophageal reflux disease without esophagitis: Secondary | ICD-10-CM | POA: Diagnosis present

## 2024-02-11 DIAGNOSIS — I10 Essential (primary) hypertension: Secondary | ICD-10-CM | POA: Diagnosis present

## 2024-02-11 DIAGNOSIS — M545 Low back pain, unspecified: Secondary | ICD-10-CM | POA: Diagnosis present

## 2024-02-11 DIAGNOSIS — D62 Acute posthemorrhagic anemia: Secondary | ICD-10-CM | POA: Diagnosis not present

## 2024-02-11 DIAGNOSIS — D649 Anemia, unspecified: Secondary | ICD-10-CM

## 2024-02-11 DIAGNOSIS — Z79890 Hormone replacement therapy (postmenopausal): Secondary | ICD-10-CM

## 2024-02-11 DIAGNOSIS — Z8674 Personal history of sudden cardiac arrest: Secondary | ICD-10-CM

## 2024-02-11 DIAGNOSIS — K2270 Barrett's esophagus without dysplasia: Secondary | ICD-10-CM | POA: Diagnosis present

## 2024-02-11 DIAGNOSIS — R195 Other fecal abnormalities: Secondary | ICD-10-CM | POA: Diagnosis not present

## 2024-02-11 DIAGNOSIS — R933 Abnormal findings on diagnostic imaging of other parts of digestive tract: Secondary | ICD-10-CM | POA: Diagnosis not present

## 2024-02-11 DIAGNOSIS — Z7985 Long-term (current) use of injectable non-insulin antidiabetic drugs: Secondary | ICD-10-CM | POA: Diagnosis not present

## 2024-02-11 DIAGNOSIS — I2510 Atherosclerotic heart disease of native coronary artery without angina pectoris: Secondary | ICD-10-CM | POA: Diagnosis present

## 2024-02-11 DIAGNOSIS — F419 Anxiety disorder, unspecified: Secondary | ICD-10-CM | POA: Diagnosis present

## 2024-02-11 DIAGNOSIS — F4310 Post-traumatic stress disorder, unspecified: Secondary | ICD-10-CM | POA: Diagnosis present

## 2024-02-11 DIAGNOSIS — K649 Unspecified hemorrhoids: Secondary | ICD-10-CM | POA: Diagnosis not present

## 2024-02-11 LAB — CBC WITH DIFFERENTIAL/PLATELET
Abs Immature Granulocytes: 0.07 K/uL (ref 0.00–0.07)
Basophils Absolute: 0.1 K/uL (ref 0.0–0.1)
Basophils Relative: 0 %
Eosinophils Absolute: 0.4 K/uL (ref 0.0–0.5)
Eosinophils Relative: 3 %
HCT: 38 % (ref 36.0–46.0)
Hemoglobin: 11.3 g/dL — ABNORMAL LOW (ref 12.0–15.0)
Immature Granulocytes: 1 %
Lymphocytes Relative: 16 %
Lymphs Abs: 2.4 K/uL (ref 0.7–4.0)
MCH: 25.1 pg — ABNORMAL LOW (ref 26.0–34.0)
MCHC: 29.7 g/dL — ABNORMAL LOW (ref 30.0–36.0)
MCV: 84.4 fL (ref 80.0–100.0)
Monocytes Absolute: 0.9 K/uL (ref 0.1–1.0)
Monocytes Relative: 6 %
Neutro Abs: 11.5 K/uL — ABNORMAL HIGH (ref 1.7–7.7)
Neutrophils Relative %: 74 %
Platelets: 347 K/uL (ref 150–400)
RBC: 4.5 MIL/uL (ref 3.87–5.11)
RDW: 16.9 % — ABNORMAL HIGH (ref 11.5–15.5)
WBC: 15.4 K/uL — ABNORMAL HIGH (ref 4.0–10.5)
nRBC: 0 % (ref 0.0–0.2)

## 2024-02-11 LAB — URINALYSIS, ROUTINE W REFLEX MICROSCOPIC
Bilirubin Urine: NEGATIVE
Glucose, UA: NEGATIVE mg/dL
Hgb urine dipstick: NEGATIVE
Ketones, ur: NEGATIVE mg/dL
Leukocytes,Ua: NEGATIVE
Nitrite: NEGATIVE
Protein, ur: NEGATIVE mg/dL
Specific Gravity, Urine: 1.011 (ref 1.005–1.030)
pH: 6 (ref 5.0–8.0)

## 2024-02-11 LAB — COMPREHENSIVE METABOLIC PANEL WITH GFR
ALT: 19 U/L (ref 0–44)
AST: 24 U/L (ref 15–41)
Albumin: 3.8 g/dL (ref 3.5–5.0)
Alkaline Phosphatase: 124 U/L (ref 38–126)
Anion gap: 14 (ref 5–15)
BUN: 7 mg/dL (ref 6–20)
CO2: 24 mmol/L (ref 22–32)
Calcium: 9.4 mg/dL (ref 8.9–10.3)
Chloride: 99 mmol/L (ref 98–111)
Creatinine, Ser: 0.75 mg/dL (ref 0.44–1.00)
GFR, Estimated: >60 mL/min (ref >60)
Glucose, Bld: 135 mg/dL — ABNORMAL HIGH (ref 70–99)
Potassium: 4.3 mmol/L (ref 3.5–5.1)
Sodium: 137 mmol/L (ref 135–145)
Total Bilirubin: 0.4 mg/dL (ref 0.0–1.2)
Total Protein: 7.8 g/dL (ref 6.5–8.1)

## 2024-02-11 LAB — LIPASE, BLOOD: Lipase: 11 U/L (ref 11–51)

## 2024-02-11 MED ORDER — ONDANSETRON HCL 4 MG PO TABS: 4.0000 mg | ORAL_TABLET | Freq: Four times a day (QID) | ORAL | Status: DC | PRN

## 2024-02-11 MED ORDER — HYDROMORPHONE HCL 1 MG/ML IJ SOLN
0.5000 mg | Freq: Once | INTRAMUSCULAR | Status: AC
  Administered 2024-02-11: 0.5 mg via INTRAVENOUS
  Filled 2024-02-11: qty 1

## 2024-02-11 MED ORDER — DEXTROSE IN LACTATED RINGERS 5 % IV SOLN: INTRAVENOUS | Status: DC

## 2024-02-11 MED ORDER — PANTOPRAZOLE SODIUM 40 MG IV SOLR
40.0000 mg | Freq: Two times a day (BID) | INTRAVENOUS | Status: DC
  Administered 2024-02-12 – 2024-02-13 (×3): 40 mg via INTRAVENOUS
  Filled 2024-02-11 (×3): qty 10

## 2024-02-11 MED ORDER — MORPHINE SULFATE (PF) 2 MG/ML IV SOLN
2.0000 mg | INTRAVENOUS | Status: DC | PRN
  Administered 2024-02-12 (×4): 2 mg via INTRAVENOUS
  Filled 2024-02-11 (×4): qty 1

## 2024-02-11 MED ORDER — PANTOPRAZOLE SODIUM 40 MG IV SOLR
80.0000 mg | Freq: Once | INTRAVENOUS | Status: AC
  Administered 2024-02-11: 80 mg via INTRAVENOUS
  Filled 2024-02-11: qty 20

## 2024-02-11 MED ORDER — IOHEXOL 300 MG/ML  SOLN
100.0000 mL | Freq: Once | INTRAMUSCULAR | Status: AC | PRN
  Administered 2024-02-11: 100 mL via INTRAVENOUS

## 2024-02-11 MED ORDER — ONDANSETRON HCL 4 MG/2ML IJ SOLN: 4.0000 mg | Freq: Four times a day (QID) | INTRAMUSCULAR | Status: DC | PRN

## 2024-02-11 NOTE — H&P (Signed)
 History and Physical    Patient: [name] FMW:[id number] DOB: [birth year] DOA: 02/11/2024 DOS: the patient was seen and examined on 02/11/2024 PCP: [name], PA-C  Patient coming from: Home  Chief Complaint:  Chief Complaint  Patient presents with   Bloody Stools   HPI: [name] is a 53 y.o. female with medical history significant of type 2 diabetes, depression, GERD, essential hypertension, hypothyroidism, PTSD, hyperlipidemia, coronary artery disease, chronic low back pain and Barrett's esophagus who was brought in from home by family with 5 days of abdominal pain and rectal bleed.  Patient is also complaining of low back pain.  She apparently had recent surgery.  They reported multiple episode of rectal bleed.  Patient was seen and evaluated.  Hemoglobin is stable.  She is guaiac positive.  Patient follows with [facility].  She has been admitted for evaluation of lower GI bleed.  Her previous hemoglobin was 13.7 about a month ago but down to 11.3 today.  Review of Systems: As mentioned in the history of present illness. All other systems reviewed and are negative. Past Medical History:  Diagnosis Date   Anxiety    Arthritis    joints ache all over (10/15/2014)   Barrett's esophagus    Bulging lumbar disc    Cardiac arrest (HCC)    Chronic lower back pain    Coronary artery disease    DDD (degenerative disc disease), cervical    Depression    DM (diabetes mellitus) (HCC)    Drug-seeking behavior    GERD (gastroesophageal reflux disease)    Headache    weekly (10/15/2014)   Hyperlipemia    Hypertension    Hypothyroidism    PTSD (post-traumatic stress disorder)    Skin cancer    had them cut off my arms; don't know what kind   Past Surgical History:  Procedure Laterality Date   ABLATION ON ENDOMETRIOSIS  2008   BIOPSY  12/27/2018   Procedure: BIOPSY;  Surgeon: Eda Iha, MD;  Location: [facility] ENDOSCOPY;  Service: Gastroenterology;;   BIOPSY   10/05/2021   Procedure: BIOPSY;  Surgeon: Avram Lupita BRAVO, MD;  Location: [facility] ENDOSCOPY;  Service: Gastroenterology;;   ESOPHAGOGASTRODUODENOSCOPY (EGD) WITH PROPOFOL  N/A 12/27/2018   Procedure: ESOPHAGOGASTRODUODENOSCOPY (EGD) WITH PROPOFOL ;  Surgeon: Eda Iha, MD;  Location: [facility] ENDOSCOPY;  Service: Gastroenterology;  Laterality: N/A;   ESOPHAGOGASTRODUODENOSCOPY (EGD) WITH PROPOFOL  N/A 10/05/2021   Procedure: ESOPHAGOGASTRODUODENOSCOPY (EGD) WITH PROPOFOL ;  Surgeon: Avram Lupita BRAVO, MD;  Location: [facility] ENDOSCOPY;  Service: Gastroenterology;  Laterality: N/A;   HEMORRHOID SURGERY  ~ 2002   IR FLUORO GUIDE CV LINE RIGHT  06/14/2020   IR REMOVAL TUN CV CATH W/O FL  06/23/2020   IR US  GUIDE VASC ACCESS RIGHT  06/14/2020   LAMINECTOMY WITH POSTERIOR LATERAL ARTHRODESIS LEVEL 4 N/A 11/06/2023   Procedure: LAMINECTOMY WITH POSTERIOR LATERAL ARTHRODESIS THORACIC TEN- THORACIC ELEVEN, THORACIC ELEVEN- THORACIC TWELVE, THORACIC TWELVE- LUMBAR ONE- LUMBAR TWO, LAMINECTOMY THORACIC TWELVE;  Surgeon: Joshua Alm Hamilton, MD;  Location: [facility] OR;  Service: Neurosurgery;  Laterality: N/A;  Posterior lateral fusion - T10-T11 - T11-T12 - T12-L1 -  - L1-L2, Laminectomy T12   ORIF ANKLE FRACTURE Right 03/28/2020   Procedure: OPEN REDUCTION INTERNAL FIXATION (ORIF) RIGHT BIMALLEOLAR ANKLE FRACTURE;  Surgeon: Josefina Chew, MD;  Location: [facility];  Service: Orthopedics;  Laterality: Right;   Social History:  reports that she has never smoked. She has never used smokeless tobacco. She reports that she  does not currently use alcohol . She reports that she does not use drugs.  No Known Allergies  Family History  Problem Relation Age of Onset   Breast cancer Mother    Diabetes Mother    Breast cancer Maternal Grandmother    Breast cancer Paternal Grandmother    Colon polyps Paternal Grandmother    Colon cancer Neg Hx    Esophageal cancer Neg Hx    Rectal cancer Neg Hx    Stomach cancer Neg Hx      Prior to Admission medications   Medication Sig Start Date End Date Taking? Authorizing Provider  acetaminophen  (TYLENOL ) 650 MG CR tablet Take 1,300 mg by mouth every 8 (eight) hours as needed for pain.    [provider]  ARIPiprazole  (ABILIFY ) 10 MG tablet Take 1 tablet (10 mg total) by mouth daily. 10/16/23     ARIPiprazole  (ABILIFY ) 10 MG tablet Take 1 tablet (10 mg total) by mouth daily. 11/15/23     ARIPiprazole  (ABILIFY ) 10 MG tablet 1 tablet by mouth daily 12/18/23     atorvastatin  (LIPITOR) 20 MG tablet Take 1 tablet (20 mg total) by mouth daily. 09/28/23     B Complex-C-Folic Acid  (RENAL VITAMIN) 0.8 MG TABS Take 1 tablet by mouth daily. 01/24/24     carvedilol  (COREG ) 6.25 MG tablet Take 1 tablet (6.25 mg total) by mouth 2 (two) times daily with a meal. 10/26/23   Kathrin Mignon DASEN, MD  carvedilol  (COREG ) 6.25 MG tablet Take 1 tablet (6.25 mg total) by mouth 2 (two) times daily. 11/14/23     clonazePAM  (KLONOPIN ) 0.5 MG tablet Take 1 tablet (0.5 mg total) by mouth daily as needed for severe breakthrough panic/anxiety 12/18/23     doxycycline  (VIBRA -TABS) 100 MG tablet Take 1 tablet (100 mg total) by mouth 2 (two) times daily. 11/25/23     DULoxetine  (CYMBALTA ) 60 MG capsule Take 1 capsule (60 mg total) by mouth 2 (two) times daily. 10/16/23     DULoxetine  (CYMBALTA ) 60 MG capsule Take 1 capsule (60 mg total) by mouth 2 (two) times daily. 11/15/23     DULoxetine  (CYMBALTA ) 60 MG capsule Take 1 capsule (60 mg total) by mouth 2 (two) times daily. 12/18/23     ibuprofen  (ADVIL ) 200 MG tablet Take 400 mg by mouth daily as needed for moderate pain (pain score 4-6) or mild pain (pain score 1-3).    [provider]  levothyroxine  (SYNTHROID ) 50 MCG tablet Take 50 mcg by mouth daily before breakfast.    [provider]  lidocaine  (LIDODERM ) 5 % Place 1 patch onto the skin daily. Remove & Discard patch within 12 hours or as directed by MD 10/11/23   Rai, Nydia POUR, MD   metoCLOPramide  (REGLAN ) 5 MG tablet Take 1 tablet (5 mg total) by mouth every 8 (eight) hours as needed for nausea or vomiting. 10/11/23   Rai, Nydia POUR, MD  multivitamin (RENA-VIT) TABS tablet Take 1 tablet by mouth at bedtime. 08/01/20   Tobie Burgess Opoka, MD  ondansetron  (ZOFRAN -ODT) 8 MG disintegrating tablet Dissilve 1 tablet (8 mg total) by mouth every 8 (eight) hours as needed for nausea and vomiting. 11/29/23     oxyCODONE  (ROXICODONE ) 15 MG immediate release tablet Take 1 tablet (15 mg total) by mouth every 6 (six) hours as needed. 11/29/23     oxyCODONE  (ROXICODONE ) 15 MG immediate release tablet Take 1 tablet (15 mg total) by mouth 4 (four) times daily as needed. 11/29/23  oxyCODONE  (ROXICODONE ) 15 MG immediate release tablet Take 1 tablet (15 mg total) by mouth 4 (four) times daily as needed. 12/27/23     oxyCODONE  (ROXICODONE ) 15 MG immediate release tablet Take 1 tablet (15 mg total) by mouth 4 (four) times daily as needed. 01/24/24     pantoprazole  (PROTONIX ) 40 MG tablet Take 1 tablet by mouth twice daily 09/21/22   Kerman Vina HERO, NP  prazosin  (MINIPRESS ) 5 MG capsule Take 1 capsule (5 mg total) by mouth at bedtime. 10/16/23     prazosin  (MINIPRESS ) 5 MG capsule Take 1 capsule (5 mg total) by mouth at bedtime. 11/15/23     prazosin  (MINIPRESS ) 5 MG capsule Take 1 capsule (5 mg total) by mouth at bedtime. 12/18/23     pregabalin  (LYRICA ) 50 MG capsule Take 50 mg by mouth 2 (two) times daily. 07/10/23   [provider]  PROLIA 60 MG/ML SOSY injection Inject 60 mg into the skin every 6 (six) months. 09/30/23   [provider]  promethazine  (PHENERGAN ) 25 MG tablet Take 1 tablet (25 mg total) by mouth 3 (three) times daily as needed. 12/11/23     tirzepatide  (MOUNJARO ) 10 MG/0.5ML Pen Inject 10 mg into the skin once a week. 02/10/24     tirzepatide  (MOUNJARO ) 7.5 MG/0.5ML Pen Inject 7.5 mg into the skin once a week. 10/22/23     tiZANidine  (ZANAFLEX ) 4 MG tablet Take 1 tablet (4  mg total) by mouth every 6 (six) hours as needed. 10/22/23     tiZANidine  (ZANAFLEX ) 4 MG tablet Take 1 tablet (4 mg total) by mouth every 6 (six) hours as needed. 11/29/23     tiZANidine  (ZANAFLEX ) 4 MG tablet Take 1 tablet (4 mg total) by mouth every 8 (eight) hours as needed for pain. 12/30/23     trazodone  (DESYREL ) 300 MG tablet Take 2 tablets (600 mg total) by mouth at bedtime. 10/16/23     trazodone  (DESYREL ) 300 MG tablet Take 2 tablets (600 mg total) by mouth at bedtime. 11/15/23     trazodone  (DESYREL ) 300 MG tablet Take 2 tablets (600 mg total) by mouth at bedtime. 12/18/23     Vitamin D , Ergocalciferol , (DRISDOL ) 1.25 MG (50000 UNIT) CAPS capsule Take 1 capsule (50,000 Units total) by mouth every 7 (seven) days. 11/01/23   Kathrin Mignon DASEN, MD    Physical Exam: Vitals:   02/11/24 1438 02/11/24 1808  BP: (!) 135/93 (!) 137/94  Pulse: 98 92  Resp: 14 15  Temp: 99.3 F (37.4 C) 98.5 F (36.9 C)  TempSrc: Oral Oral  SpO2: 95% 97%  Weight: 89.4 kg   Height: 5' 4 (1.626 m)    Constitutional: Obese, acutely ill looking NAD, calm, comfortable Eyes: PERRL, lids and conjunctivae normal ENMT: Mucous membranes are moist. Posterior pharynx clear of any exudate or lesions.Normal dentition.  Neck: normal, supple, no masses, no thyromegaly Respiratory: clear to auscultation bilaterally, no wheezing, no crackles. Normal respiratory effort. No accessory muscle use.  Cardiovascular: Regular rate and rhythm, no murmurs / rubs / gallops. No extremity edema. 2+ pedal pulses. No carotid bruits.  Abdomen: Mild lower abdominal tenderness, no masses palpated. No hepatosplenomegaly. Bowel sounds positive.  Musculoskeletal: Good range of motion, no joint swelling or tenderness, Skin: no rashes, lesions, ulcers. No induration Neurologic: CN 2-12 grossly intact. Sensation intact, DTR normal. Strength 5/5 in all 4.  Psychiatric: Normal judgment and insight. Alert and oriented x 3. Normal mood  Data  Reviewed:  Temperature 99.3, blood pressure  137/94, pulse 98.  White count 15.4 hemoglobin 11.3 and glucose 135.  Urinalysis negative.  CT abdomen pelvis showed moderate fecal retention consistent with constipation no bowel obstruction or ileus with hepatic steatosis.  Assessment and Plan:  #1 lower GI bleed: Patient will be admitted for management of GI bleed.  We will check serial H&H.  IV Protonix  initiated.  GI consult.  Hydrate.  #2 chronic pain syndrome: Continue chronic pain management.  #3 type 2 diabetes: Will initiate sliding scale insulin   #4 Barrett's esophagus: Continue PPI  #5 essential hypertension: Confirm on resume home regimen  #6 bipolar disorder: Will confirm on resume home regimen  #7 depression with anxiety: Stable.  Resume home regimen.  #8 hypothyroidism: Continue levothyroxine     Advance Care Planning:   Code Status: Prior full code  Consults: [facility]  Family Communication: Husband and son at bedside  Severity of Illness: The appropriate patient status for this patient is INPATIENT. Inpatient status is judged to be reasonable and necessary in order to provide the required intensity of service to ensure the patient's safety. The patient's presenting symptoms, physical exam findings, and initial radiographic and laboratory data in the context of their chronic comorbidities is felt to place them at high risk for further clinical deterioration. Furthermore, it is not anticipated that the patient will be medically stable for discharge from the hospital within 2 midnights of admission.   * I certify that at the point of admission it is my clinical judgment that the patient will require inpatient hospital care spanning beyond 2 midnights from the point of admission due to high intensity of service, high risk for further deterioration and high frequency of surveillance required.*  AuthorBETHA SIM KNOLL, MD 02/11/2024 9:11 PM  For on call review  [email or web address].

## 2024-02-11 NOTE — ED Triage Notes (Signed)
 Patient has both dark and bright red blood in her stool for 3 days. Has lower abdominal pain for 5 days. Has been vomiting. No diarrhea.

## 2024-02-11 NOTE — ED Provider Notes (Signed)
 [place] EMERGENCY DEPARTMENT AT [facility] Provider Note   CSN: [id number] Arrival date & time: 02/11/24  1429     Patient presents with: Bloody Stools   [name] is a 53 y.o. female.   53 year old female presents for evaluation of bloody stools.  States she has had dark black and maroon-colored stools as well as bright black blood per rectum.  She states has been gone for a few days.  States she has become very fatigued especially with exertion.  Is not on any blood thinners and denies any history of GI bleed.  States it does hurt to have a bowel movement.  States she also has chronic back pain for which she takes oxycodone  15 mg at home.  Denies any other symptoms or concerns.        Prior to Admission medications   Medication Sig Start Date End Date Taking? Authorizing Provider  ARIPiprazole  (ABILIFY ) 10 MG tablet Take 1 tablet (10 mg total) by mouth daily. 10/16/23  Yes   acetaminophen  (TYLENOL ) 650 MG CR tablet Take 1,300 mg by mouth every 8 (eight) hours as needed for pain.    [provider]  ARIPiprazole  (ABILIFY ) 10 MG tablet Take 1 tablet (10 mg total) by mouth daily. 11/15/23     ARIPiprazole  (ABILIFY ) 10 MG tablet 1 tablet by mouth daily 12/18/23     atorvastatin  (LIPITOR) 20 MG tablet Take 1 tablet (20 mg total) by mouth daily. 09/28/23     B Complex-C-Folic Acid  (RENAL VITAMIN) 0.8 MG TABS Take 1 tablet by mouth daily. 01/24/24     carvedilol  (COREG ) 6.25 MG tablet Take 1 tablet (6.25 mg total) by mouth 2 (two) times daily with a meal. 10/26/23   Kathrin Mignon DASEN, MD  carvedilol  (COREG ) 6.25 MG tablet Take 1 tablet (6.25 mg total) by mouth 2 (two) times daily. 11/14/23     clonazePAM  (KLONOPIN ) 0.5 MG tablet Take 1 tablet (0.5 mg total) by mouth daily as needed for severe breakthrough panic/anxiety 12/18/23     doxycycline  (VIBRA -TABS) 100 MG tablet Take 1 tablet (100 mg total) by mouth 2 (two) times daily. 11/25/23     DULoxetine  (CYMBALTA ) 60 MG  capsule Take 1 capsule (60 mg total) by mouth 2 (two) times daily. 10/16/23     DULoxetine  (CYMBALTA ) 60 MG capsule Take 1 capsule (60 mg total) by mouth 2 (two) times daily. 11/15/23     DULoxetine  (CYMBALTA ) 60 MG capsule Take 1 capsule (60 mg total) by mouth 2 (two) times daily. 12/18/23     ibuprofen  (ADVIL ) 200 MG tablet Take 400 mg by mouth daily as needed for moderate pain (pain score 4-6) or mild pain (pain score 1-3).    [provider]  levothyroxine  (SYNTHROID ) 50 MCG tablet Take 50 mcg by mouth daily before breakfast.    [provider]  lidocaine  (LIDODERM ) 5 % Place 1 patch onto the skin daily. Remove & Discard patch within 12 hours or as directed by MD 10/11/23   Rai, Nydia POUR, MD  metoCLOPramide  (REGLAN ) 5 MG tablet Take 1 tablet (5 mg total) by mouth every 8 (eight) hours as needed for nausea or vomiting. 10/11/23   Rai, Ripudeep K, MD  multivitamin (RENA-VIT) TABS tablet Take 1 tablet by mouth at bedtime. 08/01/20   Tobie Burgess [place], MD  ondansetron  (ZOFRAN -ODT) 8 MG disintegrating tablet Dissilve 1 tablet (8 mg total) by mouth every 8 (eight) hours as needed for nausea and vomiting. 11/29/23  oxyCODONE  (ROXICODONE ) 15 MG immediate release tablet Take 1 tablet (15 mg total) by mouth every 6 (six) hours as needed. 11/29/23     oxyCODONE  (ROXICODONE ) 15 MG immediate release tablet Take 1 tablet (15 mg total) by mouth 4 (four) times daily as needed. 11/29/23     oxyCODONE  (ROXICODONE ) 15 MG immediate release tablet Take 1 tablet (15 mg total) by mouth 4 (four) times daily as needed. 12/27/23     oxyCODONE  (ROXICODONE ) 15 MG immediate release tablet Take 1 tablet (15 mg total) by mouth 4 (four) times daily as needed. 01/24/24     pantoprazole  (PROTONIX ) 40 MG tablet Take 1 tablet by mouth twice daily 09/21/22   Kerman Vina HERO, NP  prazosin  (MINIPRESS ) 5 MG capsule Take 1 capsule (5 mg total) by mouth at bedtime. 10/16/23     prazosin  (MINIPRESS ) 5 MG capsule Take 1 capsule (5  mg total) by mouth at bedtime. 11/15/23     prazosin  (MINIPRESS ) 5 MG capsule Take 1 capsule (5 mg total) by mouth at bedtime. 12/18/23     pregabalin  (LYRICA ) 50 MG capsule Take 50 mg by mouth 2 (two) times daily. 07/10/23   [provider]  PROLIA 60 MG/ML SOSY injection Inject 60 mg into the skin every 6 (six) months. 09/30/23   [provider]  promethazine  (PHENERGAN ) 25 MG tablet Take 1 tablet (25 mg total) by mouth 3 (three) times daily as needed. 12/11/23     tirzepatide  (MOUNJARO ) 10 MG/0.5ML Pen Inject 10 mg into the skin once a week. 02/10/24     tirzepatide  (MOUNJARO ) 7.5 MG/0.5ML Pen Inject 7.5 mg into the skin once a week. 10/22/23     tiZANidine  (ZANAFLEX ) 4 MG tablet Take 1 tablet (4 mg total) by mouth every 6 (six) hours as needed. 10/22/23     tiZANidine  (ZANAFLEX ) 4 MG tablet Take 1 tablet (4 mg total) by mouth every 6 (six) hours as needed. 11/29/23     tiZANidine  (ZANAFLEX ) 4 MG tablet Take 1 tablet (4 mg total) by mouth every 8 (eight) hours as needed for pain. 12/30/23     trazodone  (DESYREL ) 300 MG tablet Take 2 tablets (600 mg total) by mouth at bedtime. 10/16/23     trazodone  (DESYREL ) 300 MG tablet Take 2 tablets (600 mg total) by mouth at bedtime. 11/15/23     trazodone  (DESYREL ) 300 MG tablet Take 2 tablets (600 mg total) by mouth at bedtime. 12/18/23     Vitamin D , Ergocalciferol , (DRISDOL ) 1.25 MG (50000 UNIT) CAPS capsule Take 1 capsule (50,000 Units total) by mouth every 7 (seven) days. 11/01/23   Gonfa, Taye T, MD    Allergies: Patient has no known allergies.    Review of Systems  Constitutional:  Negative for chills and fever.  HENT:  Negative for ear pain and sore throat.   Eyes:  Negative for pain and visual disturbance.  Respiratory:  Negative for cough and shortness of breath.   Cardiovascular:  Negative for chest pain and palpitations.  Gastrointestinal:  Positive for abdominal pain and blood in stool. Negative for vomiting.  Genitourinary:  Negative  for dysuria and hematuria.  Musculoskeletal:  Negative for arthralgias and back pain.  Skin:  Negative for color change and rash.  Neurological:  Negative for seizures and syncope.  All other systems reviewed and are negative.   Updated Vital Signs BP (!) 137/94   Pulse 92   Temp 99.1 F (37.3 C) (Oral)   Resp 15   Ht  5' 4 (1.626 m)   Wt 89.4 kg   LMP 06/03/2018 (Approximate) Comment: neg hcg 05/10/20  SpO2 97%   BMI 33.81 kg/m   Physical Exam Vitals and nursing note reviewed.  Constitutional:      General: She is not in acute distress.    Appearance: Normal appearance. She is well-developed. She is not ill-appearing.  HENT:     Head: Normocephalic and atraumatic.  Eyes:     Conjunctiva/sclera: Conjunctivae normal.  Cardiovascular:     Rate and Rhythm: Normal rate and regular rhythm.     Heart sounds: No murmur heard. Pulmonary:     Effort: Pulmonary effort is normal. No respiratory distress.     Breath sounds: Normal breath sounds.  Abdominal:     Palpations: Abdomen is soft.     Tenderness: There is no abdominal tenderness.  Musculoskeletal:        General: No swelling.     Cervical back: Neck supple.  Skin:    General: Skin is warm and dry.     Capillary Refill: Capillary refill takes less than 2 seconds.  Neurological:     Mental Status: She is alert.  Psychiatric:        Mood and Affect: Mood normal.     (all labs ordered are listed, but only abnormal results are displayed) Labs Reviewed  COMPREHENSIVE METABOLIC PANEL WITH GFR - Abnormal; Notable for the following components:      Result Value   Glucose, Bld 135 (*)    All other components within normal limits  URINALYSIS, ROUTINE W REFLEX MICROSCOPIC - Abnormal; Notable for the following components:   APPearance HAZY (*)    All other components within normal limits  CBC WITH DIFFERENTIAL/PLATELET - Abnormal; Notable for the following components:   WBC 15.4 (*)    Hemoglobin 11.3 (*)    MCH 25.1 (*)     MCHC 29.7 (*)    RDW 16.9 (*)    Neutro Abs 11.5 (*)    All other components within normal limits  LIPASE, BLOOD  CBC WITH DIFFERENTIAL/PLATELET    EKG: None  Radiology: CT ABDOMEN PELVIS W CONTRAST Result Date: 02/11/2024 CLINICAL DATA:  Bilateral lower abdominal pain, blood in stool for 3 days, vomiting EXAM: CT ABDOMEN AND PELVIS WITH CONTRAST TECHNIQUE: Multidetector CT imaging of the abdomen and pelvis was performed using the standard protocol following bolus administration of intravenous contrast. RADIATION DOSE REDUCTION: This exam was performed according to the departmental dose-optimization program which includes automated exposure control, adjustment of the mA and/or kV according to patient size and/or use of iterative reconstruction technique. CONTRAST:  OMNIPAQUE  IOHEXOL  300 MG/ML  SOLN COMPARISON:  12/31/2023 FINDINGS: Lower chest: No acute pleural or parenchymal lung disease. Hepatobiliary: Hepatic steatosis. No focal liver abnormality. The gallbladder is unremarkable. Pancreas: Unremarkable. No pancreatic ductal dilatation or surrounding inflammatory changes. Spleen: Normal in size without focal abnormality. Adrenals/Urinary Tract: Adrenal glands are unremarkable. Kidneys are normal, without renal calculi, focal lesion, or hydronephrosis. Bladder is unremarkable. Stomach/Bowel: No bowel obstruction or ileus. Moderate fecal retention throughout the colon consistent with constipation. Normal retrocecal appendix. No bowel wall thickening or inflammatory change. Vascular/Lymphatic: Aortic atherosclerosis. No enlarged abdominal or pelvic lymph nodes. Reproductive: Uterus and bilateral adnexa are unremarkable. Other: No free fluid or free intraperitoneal gas. No abdominal wall hernia. Musculoskeletal: No acute or destructive bony abnormalities. Postsurgical changes are seen from spinal fusion spanning the thoracolumbar junction. Reconstructed images demonstrate no additional  findings. IMPRESSION: 1. Moderate  fecal retention consistent with constipation. No bowel obstruction or ileus. 2. Hepatic steatosis. 3.  Aortic Atherosclerosis (ICD10-I70.0). Electronically Signed   By: Ozell Daring M.D.   On: 02/11/2024 16:51     Procedures   Medications Ordered in the ED  iohexol  (OMNIPAQUE ) 300 MG/ML solution 100 mL (100 mLs Intravenous Contrast Given 02/11/24 1627)  HYDROmorphone  (DILAUDID ) injection 0.5 mg (0.5 mg Intravenous Given 02/11/24 1735)  pantoprazole  (PROTONIX ) injection 80 mg (80 mg Intravenous Given 02/11/24 2129)  HYDROmorphone  (DILAUDID ) injection 0.5 mg (0.5 mg Intravenous Given 02/11/24 2123)                                    Medical Decision Making Cardiac monitor interpretation: Sinus rhythm, no ectopy  Patient here for lower GI bleed.  She has not had any bloody bowel movements here but does have bright red blood on exam.  CT abdomen pelvis negative.  She is anemic compared to her baseline.  Hemoglobin a month ago was 13, today it is 11.3.  Given Protonix  as she states she has had dark stool as well.I did speak with Dr. Kriss, GI and they will evaluate the patient tomorrow for possible colonoscopy. I discussed patient's case with Dr. Sim, hospitalist and patient will be admitted for further workup and management. Patient is agreeable with the plan.   Problems Addressed: Lower GI bleed: undiagnosed new problem with uncertain prognosis Symptomatic anemia: undiagnosed new problem with uncertain prognosis  Amount and/or Complexity of Data Reviewed External Data Reviewed: notes.    Details: Prior ED records reviewed and patient seen on 10/3 for fall  Labs: ordered. Decision-making details documented in ED Course.    Details: Ordered and reviewed by me and patient does have anemia Radiology: ordered and independent interpretation performed. Decision-making details documented in ED Course.    Details: Ordered and interpreted by me-CT scan shows  no acute abnormality  Risk OTC drugs. Prescription drug management. Parenteral controlled substances. Drug therapy requiring intensive monitoring for toxicity. Decision regarding hospitalization.    Final diagnoses:  Lower GI bleed  Symptomatic anemia    ED Discharge Orders     None          Gennaro Duwaine CROME, DO 02/11/24 2248

## 2024-02-11 NOTE — ED Provider Triage Note (Signed)
 Emergency Medicine Provider Triage Evaluation Note  [name] , a 53 y.o. female  was evaluated in triage.  Pt complains of hematochezia x 3 days accompanied with RLQ abdominal pain and vomiting, last episode yesterday evening. Have been using phenergan  suppositories with relief of nausea. Reports emesis nonbloody, nonbilous.   Has been tolerating fluids today without vomiting.   Endorses dysuria x 1 week.  Denies alcohol  use, marijuana use, chronic NSAID use. Does use oxycodone  for chronic back pain.   Denies fever, vision changes, chest pain, shortness of breath, cough, congestion, hematuria, vaginal discharge, vaginal bleeding, LE swelling, rashes.   Review of Systems  Positive: N/a Negative: N/a  Physical Exam  BP (!) 135/93   Pulse 98   Temp 99.3 F (37.4 C) (Oral)   Resp 14   Ht 5' 4 (1.626 m)   Wt 89.4 kg   LMP 06/03/2018 (Approximate) Comment: neg hcg 05/10/20  SpO2 95%   BMI 33.81 kg/m  Gen:   Awake, no distress   Resp:  Normal effort  MSK:   Moves extremities without difficulty  Other:    Medical Decision Making  Medically screening exam initiated at 2:42 PM.  Appropriate orders placed.  [name] was informed that the remainder of the evaluation will be completed by another provider, this initial triage assessment does not replace that evaluation, and the importance of remaining in the ED until their evaluation is complete.     [name], [place] 02/11/24 1447

## 2024-02-11 NOTE — ED Notes (Signed)
 Lab said the lavender clotted.

## 2024-02-12 DIAGNOSIS — K5900 Constipation, unspecified: Secondary | ICD-10-CM

## 2024-02-12 DIAGNOSIS — R195 Other fecal abnormalities: Secondary | ICD-10-CM

## 2024-02-12 DIAGNOSIS — R933 Abnormal findings on diagnostic imaging of other parts of digestive tract: Secondary | ICD-10-CM

## 2024-02-12 DIAGNOSIS — R1030 Lower abdominal pain, unspecified: Secondary | ICD-10-CM

## 2024-02-12 DIAGNOSIS — D62 Acute posthemorrhagic anemia: Secondary | ICD-10-CM

## 2024-02-12 DIAGNOSIS — K922 Gastrointestinal hemorrhage, unspecified: Secondary | ICD-10-CM | POA: Diagnosis not present

## 2024-02-12 LAB — CBC
HCT: 38.3 % (ref 36.0–46.0)
Hemoglobin: 11.4 g/dL — ABNORMAL LOW (ref 12.0–15.0)
MCH: 24.9 pg — ABNORMAL LOW (ref 26.0–34.0)
MCHC: 29.8 g/dL — ABNORMAL LOW (ref 30.0–36.0)
MCV: 83.6 fL (ref 80.0–100.0)
Platelets: 368 K/uL (ref 150–400)
RBC: 4.58 MIL/uL (ref 3.87–5.11)
RDW: 17.2 % — ABNORMAL HIGH (ref 11.5–15.5)
WBC: 14.1 K/uL — ABNORMAL HIGH (ref 4.0–10.5)
nRBC: 0 % (ref 0.0–0.2)

## 2024-02-12 LAB — HEMOGLOBIN AND HEMATOCRIT, BLOOD
HCT: 37.9 % (ref 36.0–46.0)
Hemoglobin: 11 g/dL — ABNORMAL LOW (ref 12.0–15.0)

## 2024-02-12 MED ORDER — TRAZODONE HCL 100 MG PO TABS
600.0000 mg | ORAL_TABLET | Freq: Every day | ORAL | Status: DC
  Administered 2024-02-12: 600 mg via ORAL
  Filled 2024-02-12 (×2): qty 6

## 2024-02-12 MED ORDER — OXYCODONE HCL 5 MG PO TABS
15.0000 mg | ORAL_TABLET | Freq: Four times a day (QID) | ORAL | Status: DC | PRN
Start: 2024-02-12 — End: 2024-02-13
  Administered 2024-02-12 – 2024-02-13 (×6): 15 mg via ORAL
  Filled 2024-02-12 (×6): qty 3

## 2024-02-12 MED ORDER — SODIUM CHLORIDE 0.9 % IV SOLN: INTRAVENOUS | Status: DC

## 2024-02-12 MED ORDER — PREGABALIN 50 MG PO CAPS: 50.0000 mg | ORAL_CAPSULE | Freq: Two times a day (BID) | ORAL | Status: DC

## 2024-02-12 MED ORDER — POLYETHYLENE GLYCOL 3350 17 G PO PACK
17.0000 g | PACK | Freq: Every day | ORAL | Status: DC
  Administered 2024-02-12 – 2024-02-13 (×2): 17 g via ORAL
  Filled 2024-02-12 (×2): qty 1

## 2024-02-12 MED ORDER — ATORVASTATIN CALCIUM 20 MG PO TABS
20.0000 mg | ORAL_TABLET | Freq: Every day | ORAL | Status: DC
  Administered 2024-02-12 – 2024-02-13 (×2): 20 mg via ORAL
  Filled 2024-02-12: qty 1
  Filled 2024-02-12: qty 2

## 2024-02-12 MED ORDER — DULOXETINE HCL 60 MG PO CPEP
60.0000 mg | ORAL_CAPSULE | Freq: Two times a day (BID) | ORAL | Status: DC
  Administered 2024-02-12 – 2024-02-13 (×3): 60 mg via ORAL
  Filled 2024-02-12: qty 2
  Filled 2024-02-12 (×2): qty 1

## 2024-02-12 MED ORDER — CLONAZEPAM 0.5 MG PO TABS: 0.5000 mg | ORAL_TABLET | Freq: Every day | ORAL | Status: DC | PRN

## 2024-02-12 MED ORDER — POLYETHYLENE GLYCOL 3350 17 G PO PACK: 17.0000 g | PACK | Freq: Every day | ORAL | Status: DC

## 2024-02-12 MED ORDER — HYDRALAZINE HCL 20 MG/ML IJ SOLN: 5.0000 mg | Freq: Four times a day (QID) | INTRAMUSCULAR | Status: DC | PRN

## 2024-02-12 MED ORDER — TIZANIDINE HCL 4 MG PO TABS
4.0000 mg | ORAL_TABLET | Freq: Four times a day (QID) | ORAL | Status: DC | PRN
  Administered 2024-02-12 – 2024-02-13 (×5): 4 mg via ORAL
  Filled 2024-02-12 (×5): qty 1

## 2024-02-12 MED ORDER — CARVEDILOL 6.25 MG PO TABS
6.2500 mg | ORAL_TABLET | Freq: Two times a day (BID) | ORAL | Status: DC
  Administered 2024-02-12 – 2024-02-13 (×4): 6.25 mg via ORAL
  Filled 2024-02-12 (×3): qty 1
  Filled 2024-02-12: qty 2

## 2024-02-12 MED ORDER — PRAZOSIN HCL 1 MG PO CAPS
5.0000 mg | ORAL_CAPSULE | Freq: Every day | ORAL | Status: DC
  Administered 2024-02-12: 5 mg via ORAL
  Filled 2024-02-12 (×2): qty 5

## 2024-02-12 MED ORDER — NA SULFATE-K SULFATE-MG SULF 17.5-3.13-1.6 GM/177ML PO SOLN
0.5000 | Freq: Once | ORAL | Status: AC
  Administered 2024-02-12: 177 mL via ORAL
  Filled 2024-02-12: qty 1

## 2024-02-12 MED ORDER — LEVOTHYROXINE SODIUM 50 MCG PO TABS: 50.0000 ug | ORAL_TABLET | Freq: Every day | ORAL | Status: DC

## 2024-02-12 MED ORDER — NA SULFATE-K SULFATE-MG SULF 17.5-3.13-1.6 GM/177ML PO SOLN
0.5000 | Freq: Once | ORAL | Status: AC
  Administered 2024-02-13: 177 mL via ORAL

## 2024-02-12 MED ORDER — ARIPIPRAZOLE 10 MG PO TABS
10.0000 mg | ORAL_TABLET | Freq: Every day | ORAL | Status: DC
  Administered 2024-02-12 – 2024-02-13 (×2): 10 mg via ORAL
  Filled 2024-02-12: qty 1
  Filled 2024-02-12: qty 2

## 2024-02-12 NOTE — Consult Note (Cosign Needed)
 Referring Provider: Dr. Sim, TRH Primary Care Physician:  [name], PA-C Primary Gastroenterologist:  Previously Dr. Eda  Reason for Consultation:  GI bleed  HPI: [name] is a 53 y.o. female with medical history significant of type 2 diabetes, depression, GERD, essential hypertension, hypothyroidism, PTSD, hyperlipidemia, coronary artery disease, chronic low back pain with surgery in July, who was brought in from home by family with 4 days of abdominal pain and rectal bleed.  Patient is also complaining of low back pain.  She tells me that she started having rectal bleeding 4 days ago, only seeing blood with stool/bowel movements that is described as red blood with some dark colored blood as well but no black stools.  Diffuse mid-lower abdominal that has been present about the same amount of time.  Had one episode of vomiting yesterday, no blood in it.  No sign of bleeding since yesterday/none today.  Says that she moves her bowels every day without any apparent constipation.  Takes a couple of stools softeners every day but that seems to help just fine.  Hgb is 11.4 grams, appears down a couple of grams from her baseline in the 13 gram range.  WBC count elevated at 14.1K but has been elevated for a couple of months.  CMP normal.  CT scan abdomen and pelvis with contrast: IMPRESSION: 1. Moderate fecal retention consistent with constipation. No bowel obstruction or ileus. 2. Hepatic steatosis. 3.  Aortic Atherosclerosis (ICD10-I70.0).  EGD 09/2021: - Z- line irregular, at the gastroesophageal junction. Some exudate - ? adherent material This does not meet criteria for Barrett' s esophagus ( > 1 cm columnar change) Biopsied given past intestinal metaplasia. - Small hiatal hernia. - Gastritis. - The examination was otherwise normal.  A.  ESOPHAGUS, Z-LINE, BIOPSY:  Oxyntocardia type gastric mucosa showing mild chronic gastritis and  reactive epithelial changes   Negative for squamous epithelium  Negative for intestinal metaplasia, dysplasia and carcinoma   Colonoscopy 02/2019: - One 1 mm polyp at the ileocecal valve, removed with a cold biopsy forceps. Resected and retrieved. - One 3 mm polyp in the proximal ascending colon, removed with a cold snare. Resected and retrieved. - Patchy mild inflammation was found in the rectum. This is of unclear clinical significance and may be related to prep artifact. Biopsied. - The examination was otherwise normal on direct and retroflexion views.  1. Surgical [P], ascending and ileocecal valve, polyp (2) - TUBULAR ADENOMA (2 OF 2 FRAGMENTS) - NO HIGH GRADE DYSPLASIA OR MALIGNANCY IDENTIFIED 2. Surgical [P], colon, rectum - BENIGN COLONIC MUCOSA - NO ACTIVE INFLAMMATION OR EVIDENCE OF MICROSCOPIC COLITIS - NO HIGH GRADE DYSPLASIA OR MALIGNANCY IDENTIFIED   Past Medical History:  Diagnosis Date   Anxiety    Arthritis    joints ache all over (10/15/2014)   Barrett's esophagus    Bulging lumbar disc    Cardiac arrest (HCC)    Chronic lower back pain    Coronary artery disease    DDD (degenerative disc disease), cervical    Depression    DM (diabetes mellitus) (HCC)    Drug-seeking behavior    GERD (gastroesophageal reflux disease)    Headache    weekly (10/15/2014)   Hyperlipemia    Hypertension    Hypothyroidism    PTSD (post-traumatic stress disorder)    Skin cancer    had them cut off my arms; don't know what kind    Past Surgical History:  Procedure Laterality Date  ABLATION ON ENDOMETRIOSIS  2008   BIOPSY  12/27/2018   Procedure: BIOPSY;  Surgeon: Eda Iha, MD;  Location: [facility];  Service: Gastroenterology;;   BIOPSY  10/05/2021   Procedure: BIOPSY;  Surgeon: Avram Lupita BRAVO, MD;  Location: [facility] END[facility]COPY;  Service: Gastroenterology;;   ESOPHAGOGASTRODUODENOSCOPY (EGD) WITH PROPOFOL  N/A 12/27/2018   Procedure: ESOPHAGOGASTRODUODENOSCOPY (EGD) WITH PROPOFOL ;  Surgeon:  Eda Iha, MD;  Location: [facility];  Service: Gastroenterology;  Laterality: N/A;   ESOPHAGOGASTRODUODENOSCOPY (EGD) WITH PROPOFOL  N/A 10/05/2021   Procedure: ESOPHAGOGASTRODUODENOSCOPY (EGD) WITH PROPOFOL ;  Surgeon: Avram Lupita BRAVO, MD;  Location: [facility] ENDOSCOPY;  Service: Gastroenterology;  Laterality: N/A;   HEMORRHOID SURGERY  ~ 2002   IR FLUORO GUIDE CV LINE RIGHT  06/14/2020   IR REMOVAL TUN CV CATH W/O FL  06/23/2020   IR US  GUIDE VASC ACCESS RIGHT  06/14/2020   LAMINECTOMY WITH POSTERIOR LATERAL ARTHRODESIS LEVEL 4 N/A 11/06/2023   Procedure: LAMINECTOMY WITH POSTERIOR LATERAL ARTHRODESIS THORACIC TEN- THORACIC ELEVEN, THORACIC ELEVEN- THORACIC TWELVE, THORACIC TWELVE- LUMBAR ONE- LUMBAR TWO, LAMINECTOMY THORACIC TWELVE;  Surgeon: Joshua Alm Hamilton, MD;  Location[facility];  Service: Neurosurgery;  Laterality: N/A;  Posterior lateral fusion - T10-T11 - T11-T12 - T12-L1 -  - L1-L2, Laminectomy T12   ORIF ANKLE FRACTURE Right 03/28/2020   Procedure: OPEN REDUCTION INTERNAL FIXATION (ORIF) RIGHT BIMALLEOLAR ANKLE FRACTURE;  Surgeon: Josefina Chew, MD;  Location: [facility];  Service: Orthopedics;  Laterality: Right;    Prior to Admission medications   Medication Sig Start Date End Date Taking? Authorizing Provider  ARIPiprazole  (ABILIFY ) 10 MG tablet Take 1 tablet (10 mg total) by mouth daily. 10/16/23  Yes   atorvastatin  (LIPITOR) 20 MG tablet Take 1 tablet (20 mg total) by mouth daily. 09/28/23  Yes   B Complex-C-Folic Acid  (RENAL VITAMIN) 0.8 MG TABS Take 1 tablet by mouth daily. 01/24/24  Yes   carvedilol  (COREG ) 6.25 MG tablet Take 1 tablet (6.25 mg total) by mouth 2 (two) times daily with a meal. 10/26/23  Yes Kathrin Mignon DASEN, MD  clonazePAM  (KLONOPIN ) 0.5 MG tablet Take 1 tablet (0.5 mg total) by mouth daily as needed for severe breakthrough panic/anxiety 12/18/23  Yes   DULoxetine  (CYMBALTA ) 60 MG capsule Take 1 capsule (60 mg total) by mouth 2 (two) times daily. 10/16/23  Yes    levothyroxine  (SYNTHROID ) 50 MCG tablet Take 50 mcg by mouth daily before breakfast.   Yes [provider]  metoCLOPramide  (REGLAN ) 5 MG tablet Take 1 tablet (5 mg total) by mouth every 8 (eight) hours as needed for nausea or vomiting. 10/11/23  Yes Rai, Ripudeep K, MD  multivitamin (RENA-VIT) TABS tablet Take 1 tablet by mouth at bedtime. 08/01/20  Yes Tobie Burgess Opoka, MD  ondansetron  (ZOFRAN -ODT) 8 MG disintegrating tablet Dissilve 1 tablet (8 mg total) by mouth every 8 (eight) hours as needed for nausea and vomiting. 11/29/23  Yes   oxyCODONE  (ROXICODONE ) 15 MG immediate release tablet Take 1 tablet (15 mg total) by mouth every 6 (six) hours as needed. 11/29/23  Yes   pantoprazole  (PROTONIX ) 40 MG tablet Take 1 tablet by mouth twice daily 09/21/22  Yes Kerman Vina HERO, NP  prazosin  (MINIPRESS ) 5 MG capsule Take 1 capsule (5 mg total) by mouth at bedtime. 10/16/23  Yes   PROLIA 60 MG/ML SOSY injection Inject 60 mg into the skin every 6 (six) months. 09/30/23  Yes [provider]  promethazine  (PHENERGAN ) 25 MG tablet Take 1 tablet (25  mg total) by mouth 3 (three) times daily as needed. 12/11/23  Yes   tirzepatide  (MOUNJARO ) 7.5 MG/0.5ML Pen Inject 7.5 mg into the skin once a week. 10/22/23  Yes   tiZANidine  (ZANAFLEX ) 4 MG tablet Take 1 tablet (4 mg total) by mouth every 6 (six) hours as needed. 10/22/23  Yes   trazodone  (DESYREL ) 300 MG tablet Take 2 tablets (600 mg total) by mouth at bedtime. 10/16/23  Yes   Vitamin D , Ergocalciferol , (DRISDOL ) 1.25 MG (50000 UNIT) CAPS capsule Take 1 capsule (50,000 Units total) by mouth every 7 (seven) days. 11/01/23  Yes Gonfa, Taye T, MD  acetaminophen  (TYLENOL ) 650 MG CR tablet Take 1,300 mg by mouth every 8 (eight) hours as needed for pain. Patient not taking: Reported on 02/11/2024    [provider]  pregabalin  (LYRICA ) 50 MG capsule Take 50 mg by mouth 2 (two) times daily. 07/10/23   [provider]    Current  Facility-Administered Medications  Medication Dose Route Frequency Provider Last Rate Last Admin   ARIPiprazole  (ABILIFY ) tablet 10 mg  10 mg Oral Daily Kc, Ramesh, MD       atorvastatin  (LIPITOR) tablet 20 mg  20 mg Oral Daily Kc, Ramesh, MD       carvedilol  (COREG ) tablet 6.25 mg  6.25 mg Oral BID WC Kc, Ramesh, MD   6.25 mg at 02/12/24 9178   clonazePAM  (KLONOPIN ) tablet 0.5 mg  0.5 mg Oral Daily PRN Christobal Guadalajara, MD       dextrose  5 % in lactated ringers  infusion   Intravenous Continuous Sim Emery CROME, MD 100 mL/hr at 02/12/24 0220 Restarted at 02/12/24 0220   DULoxetine  (CYMBALTA ) DR capsule 60 mg  60 mg Oral BID Kc, Guadal[place], MD       [START ON 02/13/2024] levothyroxine  (SYNTHROID ) tablet 50 mcg  50 mcg Oral Q0600 Kc, Ramesh, MD       morphine  (PF) 2 MG/ML injection 2 mg  2 mg Intravenous Q3H PRN Garba, Mohammad L, MD   2 mg at 02/12/24 9352   ondansetron  (ZOFRAN ) tablet 4 mg  4 mg Oral Q6H PRN Sim Emery CROME, MD       Or   ondansetron  (ZOFRAN ) injection 4 mg  4 mg Intravenous Q6H PRN Sim Emery CROME, MD       oxyCODONE  (Oxy IR/ROXICODONE ) immediate release tablet 15 mg  15 mg Oral Q6H PRN Kc, Ramesh, MD   15 mg at 02/12/24 9178   pantoprazole  (PROTONIX ) injection 40 mg  40 mg Intravenous Q12H Sim Emery CROME, MD   40 mg at 02/12/24 9177   prazosin  (MINIPRESS ) capsule 5 mg  5 mg Oral QHS Kc, Ramesh, MD       tiZANidine  (ZANAFLEX ) tablet 4 mg  4 mg Oral Q6H PRN Kc, Ramesh, MD       traZODone  (DESYREL ) tablet 600 mg  600 mg Oral QHS Kc, Ramesh, MD       Current Outpatient Medications  Medication Sig Dispense Refill   ARIPiprazole  (ABILIFY ) 10 MG tablet Take 1 tablet (10 mg total) by mouth daily. 90 tablet 1   atorvastatin  (LIPITOR) 20 MG tablet Take 1 tablet (20 mg total) by mouth daily. 90 tablet 3   B Complex-C-Folic Acid  (RENAL VITAMIN) 0.8 MG TABS Take 1 tablet by mouth daily. 90 tablet 1   carvedilol  (COREG ) 6.25 MG tablet Take 1 tablet (6.25 mg total) by mouth 2 (two) times  daily with a meal. 180 tablet 0   clonazePAM  (  KLONOPIN ) 0.5 MG tablet Take 1 tablet (0.5 mg total) by mouth daily as needed for severe breakthrough panic/anxiety 30 tablet 2   DULoxetine  (CYMBALTA ) 60 MG capsule Take 1 capsule (60 mg total) by mouth 2 (two) times daily. 180 capsule 1   levothyroxine  (SYNTHROID ) 50 MCG tablet Take 50 mcg by mouth daily before breakfast.     metoCLOPramide  (REGLAN ) 5 MG tablet Take 1 tablet (5 mg total) by mouth every 8 (eight) hours as needed for nausea or vomiting. 45 tablet 0   multivitamin (RENA-VIT) TABS tablet Take 1 tablet by mouth at bedtime. 30 tablet 0   ondansetron  (ZOFRAN -ODT) 8 MG disintegrating tablet Dissilve 1 tablet (8 mg total) by mouth every 8 (eight) hours as needed for nausea and vomiting. 45 tablet 3   oxyCODONE  (ROXICODONE ) 15 MG immediate release tablet Take 1 tablet (15 mg total) by mouth every 6 (six) hours as needed. 40 tablet 0   pantoprazole  (PROTONIX ) 40 MG tablet Take 1 tablet by mouth twice daily 180 tablet 1   prazosin  (MINIPRESS ) 5 MG capsule Take 1 capsule (5 mg total) by mouth at bedtime. 90 capsule 2   PROLIA 60 MG/ML SOSY injection Inject 60 mg into the skin every 6 (six) months.     promethazine  (PHENERGAN ) 25 MG tablet Take 1 tablet (25 mg total) by mouth 3 (three) times daily as needed. 90 tablet 1   tirzepatide  (MOUNJARO ) 7.5 MG/0.5ML Pen Inject 7.5 mg into the skin once a week. 2 mL 1   tiZANidine  (ZANAFLEX ) 4 MG tablet Take 1 tablet (4 mg total) by mouth every 6 (six) hours as needed. 120 tablet 1   trazodone  (DESYREL ) 300 MG tablet Take 2 tablets (600 mg total) by mouth at bedtime. 180 tablet 0   Vitamin D , Ergocalciferol , (DRISDOL ) 1.25 MG (50000 UNIT) CAPS capsule Take 1 capsule (50,000 Units total) by mouth every 7 (seven) days. 7 capsule 0   acetaminophen  (TYLENOL ) 650 MG CR tablet Take 1,300 mg by mouth every 8 (eight) hours as needed for pain. (Patient not taking: Reported on 02/11/2024)     pregabalin  (LYRICA ) 50 MG  capsule Take 50 mg by mouth 2 (two) times daily.      Allergies as of 02/11/2024   (No Known Allergies)    Family History  Problem Relation Age of Onset   Breast cancer Mother    Diabetes Mother    Breast cancer Maternal Grandmother    Breast cancer Paternal Grandmother    Colon polyps Paternal Grandmother    Colon cancer Neg Hx    Esophageal cancer Neg Hx    Rectal cancer Neg Hx    Stomach cancer Neg Hx     Social History   Socioeconomic History   Marital status: Married    Spouse name: Not on file   Number of children: Not on file   Years of education: Not on file   Highest education level: Not on file  Occupational History   Occupation: unemployed  Tobacco Use   Smoking status: Never   Smokeless tobacco: Never  Vaping Use   Vaping status: Never Used  Substance and Sexual Activity   Alcohol  use: Not Currently    Comment: 10/15/2014 I've drank before; nothing regular; don't drink now cause of RX I'm on   Drug use: No   Sexual activity: Not Currently  Other Topics Concern   Not on file  Social History Narrative   [place]. Never  smoked; quit alcohol  10 years; used to work for [employer] NO IV drug abuse.    Social Drivers of Corporate investment banker Strain: Not on file  Food Insecurity: No Food Insecurity (10/25/2023)   Hunger Vital Sign    Worried About Running Out of Food in the Last Year: Never true    Ran Out of Food in the Last Year: Never true  Transportation Needs: No Transportation Needs (10/25/2023)   PRAPARE - Administrator, Civil Service (Medical): No    Lack of Transportation (Non-Medical): No  Physical Activity: Not on file  Stress: Not on file  Social Connections: Not on file  Intimate Partner Violence: Not At Risk (10/25/2023)   Humiliation, Afraid, Rape, and Kick questionnaire    Fear of Current or Ex-Partner: No    Emotionally Abused: No    Physically Abused: No    Sexually  Abused: No    Review of Systems: ROS is O/W negative except as mentioned in HPI.  Physical Exam: Vital signs in last 24 hours: Temp:  [98.3 F (36.8 C)-99.3 F (37.4 C)] 98.3 F (36.8 C) (10/22 0730) Pulse Rate:  [84-100] 86 (10/22 0800) Resp:  [10-19] 11 (10/22 0800) BP: (115-160)/(65-101) 115/66 (10/22 0800) SpO2:  [92 %-97 %] 92 % (10/22 0800) Weight:  [89.4 kg] 89.4 kg (10/21 1438) Last BM Date : 02/11/24 General:  Alert, Well-developed, well-nourished, pleasant and cooperative in NAD Head:  Normocephalic and atraumatic. Eyes:  Sclera clear, no icterus.  Conjunctiva pink. Ears:  Normal auditory acuity. Mouth:  No deformity or lesions.   Lungs:  Clear throughout to auscultation.  No wheezes, crackles, or rhonchi.  Heart:  Regular rate and rhythm; no murmurs, clicks, rubs, or gallops. Abdomen:  Soft, obese.  Non-distended.  BS present.  Mild diffuse TTP in the mid-lower abdomen.  Msk:  Symmetrical without gross deformities. Pulses:  Normal pulses noted. Extremities:  Without clubbing or edema. Neurologic:  Alert and oriented x 4;  grossly normal neurologically. Skin:  Intact without significant lesions or rashes. Psych:  Alert and cooperative. Normal mood and affect.  Lab Results: Recent Labs    02/11/24 2007 02/12/24 0204  WBC 15.4* 14.1*  HGB 11.3* 11.4*  HCT 38.0 38.3  PLT 347 368   BMET Recent Labs    02/11/24 1503  NA 137  K 4.3  CL 99  CO2 24  GLUCOSE 135*  BUN 7  CREATININE 0.75  CALCIUM  9.4   LFT Recent Labs    02/11/24 1503  PROT 7.8  ALBUMIN  3.8  AST 24  ALT 19  ALKPHOS 124  BILITOT 0.4   Studies/Results: CT ABDOMEN PELVIS W CONTRAST Result Date: 02/11/2024 CLINICAL DATA:  Bilateral lower abdominal pain, blood in stool for 3 days, vomiting EXAM: CT ABDOMEN AND PELVIS WITH CONTRAST TECHNIQUE: Multidetector CT imaging of the abdomen and pelvis was performed using the standard protocol following bolus administration of intravenous  contrast. RADIATION DOSE REDUCTION: This exam was performed according to the departmental dose-optimization program which includes automated exposure control, adjustment of the mA and/or kV according to patient size and/or use of iterative reconstruction technique. CONTRAST:  OMNIPAQUE  IOHEXOL  300 MG/ML  SOLN COMPARISON:  12/31/2023 FINDINGS: Lower chest: No acute pleural or parenchymal lung disease. Hepatobiliary: Hepatic steatosis. No focal liver abnormality. The gallbladder is unremarkable. Pancreas: Unremarkable. No pancreatic ductal dilatation or surrounding inflammatory changes. Spleen: Normal in size without focal abnormality. Adrenals/Urinary Tract: Adrenal glands are unremarkable. Kidneys are normal, without  renal calculi, focal lesion, or hydronephrosis. Bladder is unremarkable. Stomach/Bowel: No bowel obstruction or ileus. Moderate fecal retention throughout the colon consistent with constipation. Normal retrocecal appendix. No bowel wall thickening or inflammatory change. Vascular/Lymphatic: Aortic atherosclerosis. No enlarged abdominal or pelvic lymph nodes. Reproductive: Uterus and bilateral adnexa are unremarkable. Other: No free fluid or free intraperitoneal gas. No abdominal wall hernia. Musculoskeletal: No acute or destructive bony abnormalities. Postsurgical changes are seen from spinal fusion spanning the thoracolumbar junction. Reconstructed images demonstrate no additional findings. IMPRESSION: 1. Moderate fecal retention consistent with constipation. No bowel obstruction or ileus. 2. Hepatic steatosis. 3.  Aortic Atherosclerosis (ICD10-I70.0). Electronically Signed   By: Ozell Daring M.D.   On: 02/11/2024 16:51   IMPRESSION:  *GI bleed, lower:  Described as red blood with bowel movements x 4 days.  Last colonsocopy 02/2019 with a couple of TAs.  Hgb down maybe a couple of grams from baseline.  11.4 grams today. *Back pain with recent surgery/chronic back pain *GERD:  On twice  daily PPI at home with good control of symptoms. *DM2 *PTSD/bi-polar  PLAN: -Will plan for colonoscopy 10/23.  I have ordered two doses of miralax  for today and then regular bowel prep to begin this evening. -Monitor Hgb.  Harlene BIRCH. Zehr  02/12/2024, 9:48 AM     Attending Physician's Attestation   I have taken an interval history, reviewed the chart and examined the patient.   This is a patient that the GI service is asked to evaluate in the setting of acute blood loss anemia, rectal bleeding (maroonish stool per report) with abdominal pain.  Patient describes that over year she has had intermittent issues with generalized abdominal pain.  When she has this at home, she takes pain narcotic medication this can help the pain.  That pain medicine is prescribed for her back pain but it can be helpful for abdominal pain if that occurs.  Patient started having progressive recurrent abdominal pain in the lower abdomen within the last 3 to 4 days and subsequently had change in her bowel habits.  She normally uses the restroom on an every other day pattern but she started using the bathroom on a more frequent basis with the dark maroon-colored stool coming out more frequently.  As result of continued issues she came in for further evaluation and was found to have a slight anemia as well as slight leukocytosis.  I have reviewed her cross-sectional imaging itself and I do see concerns for significant fecal retention and constipation but no overt signs of colitis or ischemic colitis or stercoral colitis.  She does not have diverticulosis on prior colonoscopy from 5 years ago but she does have a history of adenomas.  At this point I recommend that we proceed with a colonoscopic evaluation. The risks and benefits of endoscopic evaluation were discussed with the patient; these include but are not limited to the risk of perforation, infection, bleeding, missed lesions, lack of diagnosis, severe illness requiring  hospitalization, as well as anesthesia and sedation related illnesses.  The patient and/or family is agreeable to proceed.  We are planning this for tomorrow pending her cleanout tonight and tomorrow.  All patient questions were answered to the best of my ability, and the patient agrees to the aforementioned plan of action with follow-up as indicated.   I agree with the Advanced Practitioner's note, impression, and recommendations with updates and my documentation as noted above.  The majority of the medical decision making/process, formulation of the impression/plan  of action for the patient were performed by me with substantive portion of this encounter (>50% time spent including complete performance of at least one of the key components of MDM, History, and/or Exam).   Aloha Finner, MD [facility] Advanced Endoscopy Office # [phone]

## 2024-02-12 NOTE — Progress Notes (Signed)
 PROGRESS NOTE [name]  FMW:[id number] DOB: 1971/04/12 DOA: 02/11/2024 PCP: [name], PA-C  Brief Narrative/Hospital Course: [name] is a 53 y.o. female with PMH of diabetes, hypertension, hyperlipidemia, depression/PTSD, hypothyroidism CAD chronic low back pain GERD/Barrett's esophagus presented with 5 days of abdominal pain and rectal bleeding also low back pain. In the ED patient was FOBT positive GI was consulted.  Labs with leukocytosis hemoglobin 9.3 UA unremarkable CMP stable CT abdomen pelvis with contrast: Moderate fecal retention consistent with constipation no bowel obstruction or ileus, hepatic steatosis noted  Subjective: Seen and examined today in ED this morning C/o soreness in stomach no nauseas vomiting, last BM yesterday with blood BP running high- which is usually the case for her  Overnight on room air, afebrile, Labs leukocytosis improving at 14 last CMP stable from yesterday  Assessment and plan:  Lower GI bleed Rectal bleed FOBT positive GERD/history of Barrett's: Hemoglobin remains stable overall.  GI has been consulted CT abdomen shows constipation.  Continue trend hemoglobin, PPI twice daily, gentle IV fluid hydration Recent Labs  Lab 02/11/24 2007 02/12/24 0204  HGB 11.3* 11.4*  HCT 38.0 38.3    Chronic pain syndrome Chronic low back pain: Continue multimodal pain management-with oxycodone , Cymbalta , tizanidine  and pregabalin   PTSD/depression/BPD: Continue home Abilify , clonazepam , Cymbalta   Type 2 diabetes mellitus without complication, without long-term current use of insulin : Blood sugar remains stable, on weekly Mounjaro   Essential hypertension: BP uncontrolled resume home Coreg , prazosin  . Add prn iv hydralazine  5 mg  Hypothyroidism Resume synthroid   Class I Obesity w/ Body mass index is 33.81 kg/m.: Will benefit with PCP follow-up, weight loss,healthy lifestyle and outpatient sleep eval if not done.  DVT  prophylaxis: SCDs Start: 02/11/24 2358 Code Status:   Code Status: Full Code Family Communication: plan of care discussed with patient at bedside. Patient status is: Remains hospitalized because of severity of illness Level of care: Telemetry   Dispo: The patient is from: home            Anticipated disposition: TBD Objective: Vitals last 24 hrs: Vitals:   02/12/24 0400 02/12/24 0500 02/12/24 0730 02/12/24 0800  BP: (!) 160/93  (!) 147/80 115/66  Pulse:  91 84 86  Resp: 13 19 16 11   Temp:   98.3 F (36.8 C)   TempSrc:   Oral   SpO2:  93% 94% 92%  Weight:      Height:        Physical Examination: General exam: alert awake, oriented, older than stated age HEENT:Oral mucosa moist, Ear/Nose WNL grossly Respiratory system: Bilaterally clear BS,no use of accessory muscle Cardiovascular system: S1 & S2 +, No JVD. Gastrointestinal system: Abdomen soft,NT,ND, BS+ Nervous System: Alert, awake, moving all extremities,and following commands. Extremities: extremities warm, leg edema neg Skin: Warm, no rashes MSK: Normal muscle bulk,tone, power   Medications reviewed:  Scheduled Meds:  ARIPiprazole   10 mg Oral Daily   atorvastatin   20 mg Oral Daily   carvedilol   6.25 mg Oral BID WC   DULoxetine   60 mg Oral BID   [START ON 02/13/2024] levothyroxine   50 mcg Oral Q0600   pantoprazole  (PROTONIX ) IV  40 mg Intravenous Q12H   prazosin   5 mg Oral QHS   trazodone   600 mg Oral QHS   Continuous Infusions:  dextrose  5% lactated ringers  100 mL/hr at 02/12/24 0220   Diet: Diet Order             Diet NPO time specified  Diet effective now                    Data Reviewed: I have personally reviewed following labs and imaging studies ( see epic result tab) CBC: Recent Labs  Lab 02/11/24 2007 02/12/24 0204  WBC 15.4* 14.1*  NEUTROABS 11.5*  --   HGB 11.3* 11.4*  HCT 38.0 38.3  MCV 84.4 83.6  PLT 347 368   CMP: Recent Labs  Lab 02/11/24 1503  NA 137  K 4.3  CL 99  CO2  24  GLUCOSE 135*  BUN 7  CREATININE 0.75  CALCIUM  9.4   GFR: Estimated Creatinine Clearance: 88.1 mL/min (by C-G formula based on SCr of 0.75 mg/dL). Recent Labs  Lab 02/11/24 1503  AST 24  ALT 19  ALKPHOS 124  BILITOT 0.4  PROT 7.8  ALBUMIN  3.8    Recent Labs  Lab 02/11/24 1503  LIPASE 11   No results for input(s): AMMONIA in the last 168 hours. Coagulation Profile: No results for input(s): INR, PROTIME in the last 168 hours. Unresulted Labs (From admission, onward)     Start     Ordered   02/13/24 0500  Basic metabolic panel with GFR  Tomorrow morning,   R        02/12/24 1012   02/13/24 0500  CBC  Tomorrow morning,   R        02/12/24 1012   02/12/24 1100  Hemoglobin and hematocrit, blood  Now then every 8 hours,   R (with TIMED occurrences)      02/12/24 1012   02/11/24 1447  CBC with Differential  Once,   STAT        02/11/24 1447   Signed and Held  Comprehensive metabolic panel  Tomorrow morning,   R        Signed and Held           Antimicrobials/Microbiology: Anti-infectives (From admission, onward)    None         Component Value Date/Time   SDES  12/31/2023 2000    BLOOD SITE NOT SPECIFIED Performed at [facility], [address] [place], [place] [address]    SPECREQUEST  12/31/2023 2000    Blood Culture adequate volume BOTTLES DRAWN AEROBIC AND ANAEROBIC Performed at [facility], [address] [place], [place] [address]    CULT  12/31/2023 2000    NO GROWTH 5 DAYS Performed at [facility], [address] [place], [place] [address]    REPTSTATUS 01/06/2024 FINAL 12/31/2023 2000    Procedures:    Tracy LAMY, MD [facility] 02/12/2024, 10:34 AM

## 2024-02-12 NOTE — ED Notes (Signed)
Lab draw unsuccessful. RN aware.

## 2024-02-12 NOTE — Hospital Course (Addendum)
 [name] is a 53 y.o. female with PMH of diabetes, hypertension, hyperlipidemia, depression/PTSD, hypothyroidism CAD chronic low back pain GERD/Barrett's esophagus presented with 5 days of abdominal pain and rectal bleeding also low back pain. In the ED patient was FOBT positive GI was consulted.  Labs with leukocytosis hemoglobin 9.3 UA unremarkable CMP stable CT abdomen pelvis with contrast: Moderate fecal retention consistent with constipation no bowel obstruction or ileus, hepatic steatosis noted  Subjective: Seen and examined today in ED this morning C/o soreness in stomach no nauseas vomiting, last BM yesterday with blood BP running high- which is usually the case for her  Overnight on room air, afebrile, Labs leukocytosis improving at 14 last CMP stable from yesterday  Assessment and plan:  Lower GI bleed Rectal bleed FOBT positive GERD/history of Barrett's: Hemoglobin remains stable overall.  GI has been consulted CT abdomen shows constipation.  Continue trend hemoglobin, PPI twice daily, gentle IV fluid hydration Recent Labs  Lab 02/11/24 2007 02/12/24 0204  HGB 11.3* 11.4*  HCT 38.0 38.3    Chronic pain syndrome Chronic low back pain: Continue multimodal pain management-with oxycodone , Cymbalta , tizanidine  and pregabalin   PTSD/depression/BPD: Continue home Abilify , clonazepam , Cymbalta   Type 2 diabetes mellitus without complication, without long-term current use of insulin : Blood sugar remains stable, on weekly Mounjaro   Essential hypertension: BP uncontrolled resume home Coreg , prazosin  . Add prn iv hydralazine  5 mg  Hypothyroidism Resume synthroid   Class I Obesity w/ Body mass index is 33.81 kg/m.: Will benefit with PCP follow-up, weight loss,healthy lifestyle and outpatient sleep eval if not done.  DVT prophylaxis: SCDs Start: 02/11/24 2358 Code Status:   Code Status: Full Code Family Communication: plan of care discussed with patient at  bedside. Patient status is: Remains hospitalized because of severity of illness Level of care: Telemetry   Dispo: The patient is from: home            Anticipated disposition: TBD Objective: Vitals last 24 hrs: Vitals:   02/12/24 0400 02/12/24 0500 02/12/24 0730 02/12/24 0800  BP: (!) 160/93  (!) 147/80 115/66  Pulse:  91 84 86  Resp: 13 19 16 11   Temp:   98.3 F (36.8 C)   TempSrc:   Oral   SpO2:  93% 94% 92%  Weight:      Height:        Physical Examination: General exam: alert awake, oriented, older than stated age HEENT:Oral mucosa moist, Ear/Nose WNL grossly Respiratory system: Bilaterally clear BS,no use of accessory muscle Cardiovascular system: S1 & S2 +, No JVD. Gastrointestinal system: Abdomen soft,NT,ND, BS+ Nervous System: Alert, awake, moving all extremities,and following commands. Extremities: extremities warm, leg edema neg Skin: Warm, no rashes MSK: Normal muscle bulk,tone, power   Medications reviewed:  Scheduled Meds:  ARIPiprazole   10 mg Oral Daily   atorvastatin   20 mg Oral Daily   carvedilol   6.25 mg Oral BID WC   DULoxetine   60 mg Oral BID   [START ON 02/13/2024] levothyroxine   50 mcg Oral Q0600   pantoprazole  (PROTONIX ) IV  40 mg Intravenous Q12H   prazosin   5 mg Oral QHS   trazodone   600 mg Oral QHS   Continuous Infusions:  dextrose  5% lactated ringers  100 mL/hr at 02/12/24 0220   Diet: Diet Order             Diet NPO time specified  Diet effective now

## 2024-02-12 NOTE — H&P (View-Only) (Signed)
 Referring Provider: Dr. Sim, TRH Primary Care Physician:  [name], PA-C Primary Gastroenterologist:  Previously Dr. Eda  Reason for Consultation:  GI bleed  HPI: [name] is a 53 y.o. female with medical history significant of type 2 diabetes, depression, GERD, essential hypertension, hypothyroidism, PTSD, hyperlipidemia, coronary artery disease, chronic low back pain with surgery in July, who was brought in from home by family with 4 days of abdominal pain and rectal bleed.  Patient is also complaining of low back pain.  She tells me that she started having rectal bleeding 4 days ago, only seeing blood with stool/bowel movements that is described as red blood with some dark colored blood as well but no black stools.  Diffuse mid-lower abdominal that has been present about the same amount of time.  Had one episode of vomiting yesterday, no blood in it.  No sign of bleeding since yesterday/none today.  Says that she moves her bowels every day without any apparent constipation.  Takes a couple of stools softeners every day but that seems to help just fine.  Hgb is 11.4 grams, appears down a couple of grams from her baseline in the 13 gram range.  WBC count elevated at 14.1K but has been elevated for a couple of months.  CMP normal.  CT scan abdomen and pelvis with contrast: IMPRESSION: 1. Moderate fecal retention consistent with constipation. No bowel obstruction or ileus. 2. Hepatic steatosis. 3.  Aortic Atherosclerosis (ICD10-I70.0).  EGD 09/2021: - Z- line irregular, at the gastroesophageal junction. Some exudate - ? adherent material This does not meet criteria for Barrett' s esophagus ( > 1 cm columnar change) Biopsied given past intestinal metaplasia. - Small hiatal hernia. - Gastritis. - The examination was otherwise normal.  A.  ESOPHAGUS, Z-LINE, BIOPSY:  Oxyntocardia type gastric mucosa showing mild chronic gastritis and  reactive epithelial changes   Negative for squamous epithelium  Negative for intestinal metaplasia, dysplasia and carcinoma   Colonoscopy 02/2019: - One 1 mm polyp at the ileocecal valve, removed with a cold biopsy forceps. Resected and retrieved. - One 3 mm polyp in the proximal ascending colon, removed with a cold snare. Resected and retrieved. - Patchy mild inflammation was found in the rectum. This is of unclear clinical significance and may be related to prep artifact. Biopsied. - The examination was otherwise normal on direct and retroflexion views.  1. Surgical [P], ascending and ileocecal valve, polyp (2) - TUBULAR ADENOMA (2 OF 2 FRAGMENTS) - NO HIGH GRADE DYSPLASIA OR MALIGNANCY IDENTIFIED 2. Surgical [P], colon, rectum - BENIGN COLONIC MUCOSA - NO ACTIVE INFLAMMATION OR EVIDENCE OF MICROSCOPIC COLITIS - NO HIGH GRADE DYSPLASIA OR MALIGNANCY IDENTIFIED   Past Medical History:  Diagnosis Date   Anxiety    Arthritis    joints ache all over (10/15/2014)   Barrett's esophagus    Bulging lumbar disc    Cardiac arrest (HCC)    Chronic lower back pain    Coronary artery disease    DDD (degenerative disc disease), cervical    Depression    DM (diabetes mellitus) (HCC)    Drug-seeking behavior    GERD (gastroesophageal reflux disease)    Headache    weekly (10/15/2014)   Hyperlipemia    Hypertension    Hypothyroidism    PTSD (post-traumatic stress disorder)    Skin cancer    had them cut off my arms; don't know what kind    Past Surgical History:  Procedure Laterality Date  ABLATION ON ENDOMETRIOSIS  2008   BIOPSY  12/27/2018   Procedure: BIOPSY;  Surgeon: Eda Iha, MD;  Location: [facility];  Service: Gastroenterology;;   BIOPSY  10/05/2021   Procedure: BIOPSY;  Surgeon: Avram Lupita BRAVO, MD;  Location: [facility] END[facility]COPY;  Service: Gastroenterology;;   ESOPHAGOGASTRODUODENOSCOPY (EGD) WITH PROPOFOL  N/A 12/27/2018   Procedure: ESOPHAGOGASTRODUODENOSCOPY (EGD) WITH PROPOFOL ;  Surgeon:  Eda Iha, MD;  Location: [facility];  Service: Gastroenterology;  Laterality: N/A;   ESOPHAGOGASTRODUODENOSCOPY (EGD) WITH PROPOFOL  N/A 10/05/2021   Procedure: ESOPHAGOGASTRODUODENOSCOPY (EGD) WITH PROPOFOL ;  Surgeon: Avram Lupita BRAVO, MD;  Location: [facility] ENDOSCOPY;  Service: Gastroenterology;  Laterality: N/A;   HEMORRHOID SURGERY  ~ 2002   IR FLUORO GUIDE CV LINE RIGHT  06/14/2020   IR REMOVAL TUN CV CATH W/O FL  06/23/2020   IR US  GUIDE VASC ACCESS RIGHT  06/14/2020   LAMINECTOMY WITH POSTERIOR LATERAL ARTHRODESIS LEVEL 4 N/A 11/06/2023   Procedure: LAMINECTOMY WITH POSTERIOR LATERAL ARTHRODESIS THORACIC TEN- THORACIC ELEVEN, THORACIC ELEVEN- THORACIC TWELVE, THORACIC TWELVE- LUMBAR ONE- LUMBAR TWO, LAMINECTOMY THORACIC TWELVE;  Surgeon: Joshua Alm Hamilton, MD;  Location[facility];  Service: Neurosurgery;  Laterality: N/A;  Posterior lateral fusion - T10-T11 - T11-T12 - T12-L1 -  - L1-L2, Laminectomy T12   ORIF ANKLE FRACTURE Right 03/28/2020   Procedure: OPEN REDUCTION INTERNAL FIXATION (ORIF) RIGHT BIMALLEOLAR ANKLE FRACTURE;  Surgeon: Josefina Chew, MD;  Location: [facility];  Service: Orthopedics;  Laterality: Right;    Prior to Admission medications   Medication Sig Start Date End Date Taking? Authorizing Provider  ARIPiprazole  (ABILIFY ) 10 MG tablet Take 1 tablet (10 mg total) by mouth daily. 10/16/23  Yes   atorvastatin  (LIPITOR) 20 MG tablet Take 1 tablet (20 mg total) by mouth daily. 09/28/23  Yes   B Complex-C-Folic Acid  (RENAL VITAMIN) 0.8 MG TABS Take 1 tablet by mouth daily. 01/24/24  Yes   carvedilol  (COREG ) 6.25 MG tablet Take 1 tablet (6.25 mg total) by mouth 2 (two) times daily with a meal. 10/26/23  Yes Kathrin Mignon DASEN, MD  clonazePAM  (KLONOPIN ) 0.5 MG tablet Take 1 tablet (0.5 mg total) by mouth daily as needed for severe breakthrough panic/anxiety 12/18/23  Yes   DULoxetine  (CYMBALTA ) 60 MG capsule Take 1 capsule (60 mg total) by mouth 2 (two) times daily. 10/16/23  Yes    levothyroxine  (SYNTHROID ) 50 MCG tablet Take 50 mcg by mouth daily before breakfast.   Yes [provider]  metoCLOPramide  (REGLAN ) 5 MG tablet Take 1 tablet (5 mg total) by mouth every 8 (eight) hours as needed for nausea or vomiting. 10/11/23  Yes Rai, Ripudeep K, MD  multivitamin (RENA-VIT) TABS tablet Take 1 tablet by mouth at bedtime. 08/01/20  Yes Tobie Burgess Opoka, MD  ondansetron  (ZOFRAN -ODT) 8 MG disintegrating tablet Dissilve 1 tablet (8 mg total) by mouth every 8 (eight) hours as needed for nausea and vomiting. 11/29/23  Yes   oxyCODONE  (ROXICODONE ) 15 MG immediate release tablet Take 1 tablet (15 mg total) by mouth every 6 (six) hours as needed. 11/29/23  Yes   pantoprazole  (PROTONIX ) 40 MG tablet Take 1 tablet by mouth twice daily 09/21/22  Yes Kerman Vina HERO, NP  prazosin  (MINIPRESS ) 5 MG capsule Take 1 capsule (5 mg total) by mouth at bedtime. 10/16/23  Yes   PROLIA 60 MG/ML SOSY injection Inject 60 mg into the skin every 6 (six) months. 09/30/23  Yes [provider]  promethazine  (PHENERGAN ) 25 MG tablet Take 1 tablet (25  mg total) by mouth 3 (three) times daily as needed. 12/11/23  Yes   tirzepatide  (MOUNJARO ) 7.5 MG/0.5ML Pen Inject 7.5 mg into the skin once a week. 10/22/23  Yes   tiZANidine  (ZANAFLEX ) 4 MG tablet Take 1 tablet (4 mg total) by mouth every 6 (six) hours as needed. 10/22/23  Yes   trazodone  (DESYREL ) 300 MG tablet Take 2 tablets (600 mg total) by mouth at bedtime. 10/16/23  Yes   Vitamin D , Ergocalciferol , (DRISDOL ) 1.25 MG (50000 UNIT) CAPS capsule Take 1 capsule (50,000 Units total) by mouth every 7 (seven) days. 11/01/23  Yes Gonfa, Taye T, MD  acetaminophen  (TYLENOL ) 650 MG CR tablet Take 1,300 mg by mouth every 8 (eight) hours as needed for pain. Patient not taking: Reported on 02/11/2024    [provider]  pregabalin  (LYRICA ) 50 MG capsule Take 50 mg by mouth 2 (two) times daily. 07/10/23   [provider]    Current  Facility-Administered Medications  Medication Dose Route Frequency Provider Last Rate Last Admin   ARIPiprazole  (ABILIFY ) tablet 10 mg  10 mg Oral Daily Kc, Ramesh, MD       atorvastatin  (LIPITOR) tablet 20 mg  20 mg Oral Daily Kc, Ramesh, MD       carvedilol  (COREG ) tablet 6.25 mg  6.25 mg Oral BID WC Kc, Ramesh, MD   6.25 mg at 02/12/24 9178   clonazePAM  (KLONOPIN ) tablet 0.5 mg  0.5 mg Oral Daily PRN Christobal Guadalajara, MD       dextrose  5 % in lactated ringers  infusion   Intravenous Continuous Sim Emery CROME, MD 100 mL/hr at 02/12/24 0220 Restarted at 02/12/24 0220   DULoxetine  (CYMBALTA ) DR capsule 60 mg  60 mg Oral BID Kc, Guadalaj[place], MD       [START ON 02/13/2024] levothyroxine  (SYNTHROID ) tablet 50 mcg  50 mcg Oral Q0600 Kc, Ramesh, MD       morphine  (PF) 2 MG/ML injection 2 mg  2 mg Intravenous Q3H PRN Garba, Mohammad L, MD   2 mg at 02/12/24 9352   ondansetron  (ZOFRAN ) tablet 4 mg  4 mg Oral Q6H PRN Sim Emery CROME, MD       Or   ondansetron  (ZOFRAN ) injection 4 mg  4 mg Intravenous Q6H PRN Sim Emery CROME, MD       oxyCODONE  (Oxy IR/ROXICODONE ) immediate release tablet 15 mg  15 mg Oral Q6H PRN Kc, Ramesh, MD   15 mg at 02/12/24 9178   pantoprazole  (PROTONIX ) injection 40 mg  40 mg Intravenous Q12H Sim Emery CROME, MD   40 mg at 02/12/24 9177   prazosin  (MINIPRESS ) capsule 5 mg  5 mg Oral QHS Kc, Ramesh, MD       tiZANidine  (ZANAFLEX ) tablet 4 mg  4 mg Oral Q6H PRN Kc, Ramesh, MD       traZODone  (DESYREL ) tablet 600 mg  600 mg Oral QHS Kc, Ramesh, MD       Current Outpatient Medications  Medication Sig Dispense Refill   ARIPiprazole  (ABILIFY ) 10 MG tablet Take 1 tablet (10 mg total) by mouth daily. 90 tablet 1   atorvastatin  (LIPITOR) 20 MG tablet Take 1 tablet (20 mg total) by mouth daily. 90 tablet 3   B Complex-C-Folic Acid  (RENAL VITAMIN) 0.8 MG TABS Take 1 tablet by mouth daily. 90 tablet 1   carvedilol  (COREG ) 6.25 MG tablet Take 1 tablet (6.25 mg total) by mouth 2 (two) times  daily with a meal. 180 tablet 0   clonazePAM  (  KLONOPIN ) 0.5 MG tablet Take 1 tablet (0.5 mg total) by mouth daily as needed for severe breakthrough panic/anxiety 30 tablet 2   DULoxetine  (CYMBALTA ) 60 MG capsule Take 1 capsule (60 mg total) by mouth 2 (two) times daily. 180 capsule 1   levothyroxine  (SYNTHROID ) 50 MCG tablet Take 50 mcg by mouth daily before breakfast.     metoCLOPramide  (REGLAN ) 5 MG tablet Take 1 tablet (5 mg total) by mouth every 8 (eight) hours as needed for nausea or vomiting. 45 tablet 0   multivitamin (RENA-VIT) TABS tablet Take 1 tablet by mouth at bedtime. 30 tablet 0   ondansetron  (ZOFRAN -ODT) 8 MG disintegrating tablet Dissilve 1 tablet (8 mg total) by mouth every 8 (eight) hours as needed for nausea and vomiting. 45 tablet 3   oxyCODONE  (ROXICODONE ) 15 MG immediate release tablet Take 1 tablet (15 mg total) by mouth every 6 (six) hours as needed. 40 tablet 0   pantoprazole  (PROTONIX ) 40 MG tablet Take 1 tablet by mouth twice daily 180 tablet 1   prazosin  (MINIPRESS ) 5 MG capsule Take 1 capsule (5 mg total) by mouth at bedtime. 90 capsule 2   PROLIA 60 MG/ML SOSY injection Inject 60 mg into the skin every 6 (six) months.     promethazine  (PHENERGAN ) 25 MG tablet Take 1 tablet (25 mg total) by mouth 3 (three) times daily as needed. 90 tablet 1   tirzepatide  (MOUNJARO ) 7.5 MG/0.5ML Pen Inject 7.5 mg into the skin once a week. 2 mL 1   tiZANidine  (ZANAFLEX ) 4 MG tablet Take 1 tablet (4 mg total) by mouth every 6 (six) hours as needed. 120 tablet 1   trazodone  (DESYREL ) 300 MG tablet Take 2 tablets (600 mg total) by mouth at bedtime. 180 tablet 0   Vitamin D , Ergocalciferol , (DRISDOL ) 1.25 MG (50000 UNIT) CAPS capsule Take 1 capsule (50,000 Units total) by mouth every 7 (seven) days. 7 capsule 0   acetaminophen  (TYLENOL ) 650 MG CR tablet Take 1,300 mg by mouth every 8 (eight) hours as needed for pain. (Patient not taking: Reported on 02/11/2024)     pregabalin  (LYRICA ) 50 MG  capsule Take 50 mg by mouth 2 (two) times daily.      Allergies as of 02/11/2024   (No Known Allergies)    Family History  Problem Relation Age of Onset   Breast cancer Mother    Diabetes Mother    Breast cancer Maternal Grandmother    Breast cancer Paternal Grandmother    Colon polyps Paternal Grandmother    Colon cancer Neg Hx    Esophageal cancer Neg Hx    Rectal cancer Neg Hx    Stomach cancer Neg Hx     Social History   Socioeconomic History   Marital status: Married    Spouse name: Not on file   Number of children: Not on file   Years of education: Not on file   Highest education level: Not on file  Occupational History   Occupation: unemployed  Tobacco Use   Smoking status: Never   Smokeless tobacco: Never  Vaping Use   Vaping status: Never Used  Substance and Sexual Activity   Alcohol  use: Not Currently    Comment: 10/15/2014 I've drank before; nothing regular; don't drink now cause of RX I'm on   Drug use: No   Sexual activity: Not Currently  Other Topics Concern   Not on file  Social History Narrative   [place]. Never  smoked; quit alcohol  10 years; used to work for [employer] NO IV drug abuse.    Social Drivers of Corporate investment banker Strain: Not on file  Food Insecurity: No Food Insecurity (10/25/2023)   Hunger Vital Sign    Worried About Running Out of Food in the Last Year: Never true    Ran Out of Food in the Last Year: Never true  Transportation Needs: No Transportation Needs (10/25/2023)   PRAPARE - Administrator, Civil Service (Medical): No    Lack of Transportation (Non-Medical): No  Physical Activity: Not on file  Stress: Not on file  Social Connections: Not on file  Intimate Partner Violence: Not At Risk (10/25/2023)   Humiliation, Afraid, Rape, and Kick questionnaire    Fear of Current or Ex-Partner: No    Emotionally Abused: No    Physically Abused: No    Sexually  Abused: No    Review of Systems: ROS is O/W negative except as mentioned in HPI.  Physical Exam: Vital signs in last 24 hours: Temp:  [98.3 F (36.8 C)-99.3 F (37.4 C)] 98.3 F (36.8 C) (10/22 0730) Pulse Rate:  [84-100] 86 (10/22 0800) Resp:  [10-19] 11 (10/22 0800) BP: (115-160)/(65-101) 115/66 (10/22 0800) SpO2:  [92 %-97 %] 92 % (10/22 0800) Weight:  [89.4 kg] 89.4 kg (10/21 1438) Last BM Date : 02/11/24 General:  Alert, Well-developed, well-nourished, pleasant and cooperative in NAD Head:  Normocephalic and atraumatic. Eyes:  Sclera clear, no icterus.  Conjunctiva pink. Ears:  Normal auditory acuity. Mouth:  No deformity or lesions.   Lungs:  Clear throughout to auscultation.  No wheezes, crackles, or rhonchi.  Heart:  Regular rate and rhythm; no murmurs, clicks, rubs, or gallops. Abdomen:  Soft, obese.  Non-distended.  BS present.  Mild diffuse TTP in the mid-lower abdomen.  Msk:  Symmetrical without gross deformities. Pulses:  Normal pulses noted. Extremities:  Without clubbing or edema. Neurologic:  Alert and oriented x 4;  grossly normal neurologically. Skin:  Intact without significant lesions or rashes. Psych:  Alert and cooperative. Normal mood and affect.  Lab Results: Recent Labs    02/11/24 2007 02/12/24 0204  WBC 15.4* 14.1*  HGB 11.3* 11.4*  HCT 38.0 38.3  PLT 347 368   BMET Recent Labs    02/11/24 1503  NA 137  K 4.3  CL 99  CO2 24  GLUCOSE 135*  BUN 7  CREATININE 0.75  CALCIUM  9.4   LFT Recent Labs    02/11/24 1503  PROT 7.8  ALBUMIN  3.8  AST 24  ALT 19  ALKPHOS 124  BILITOT 0.4   Studies/Results: CT ABDOMEN PELVIS W CONTRAST Result Date: 02/11/2024 CLINICAL DATA:  Bilateral lower abdominal pain, blood in stool for 3 days, vomiting EXAM: CT ABDOMEN AND PELVIS WITH CONTRAST TECHNIQUE: Multidetector CT imaging of the abdomen and pelvis was performed using the standard protocol following bolus administration of intravenous  contrast. RADIATION DOSE REDUCTION: This exam was performed according to the departmental dose-optimization program which includes automated exposure control, adjustment of the mA and/or kV according to patient size and/or use of iterative reconstruction technique. CONTRAST:  OMNIPAQUE  IOHEXOL  300 MG/ML  SOLN COMPARISON:  12/31/2023 FINDINGS: Lower chest: No acute pleural or parenchymal lung disease. Hepatobiliary: Hepatic steatosis. No focal liver abnormality. The gallbladder is unremarkable. Pancreas: Unremarkable. No pancreatic ductal dilatation or surrounding inflammatory changes. Spleen: Normal in size without focal abnormality. Adrenals/Urinary Tract: Adrenal glands are unremarkable. Kidneys are normal, without  renal calculi, focal lesion, or hydronephrosis. Bladder is unremarkable. Stomach/Bowel: No bowel obstruction or ileus. Moderate fecal retention throughout the colon consistent with constipation. Normal retrocecal appendix. No bowel wall thickening or inflammatory change. Vascular/Lymphatic: Aortic atherosclerosis. No enlarged abdominal or pelvic lymph nodes. Reproductive: Uterus and bilateral adnexa are unremarkable. Other: No free fluid or free intraperitoneal gas. No abdominal wall hernia. Musculoskeletal: No acute or destructive bony abnormalities. Postsurgical changes are seen from spinal fusion spanning the thoracolumbar junction. Reconstructed images demonstrate no additional findings. IMPRESSION: 1. Moderate fecal retention consistent with constipation. No bowel obstruction or ileus. 2. Hepatic steatosis. 3.  Aortic Atherosclerosis (ICD10-I70.0). Electronically Signed   By: Ozell Daring M.D.   On: 02/11/2024 16:51   IMPRESSION:  *GI bleed, lower:  Described as red blood with bowel movements x 4 days.  Last colonsocopy 02/2019 with a couple of TAs.  Hgb down maybe a couple of grams from baseline.  11.4 grams today. *Back pain with recent surgery/chronic back pain *GERD:  On twice  daily PPI at home with good control of symptoms. *DM2 *PTSD/bi-polar  PLAN: -Will plan for colonoscopy 10/23.  I have ordered two doses of miralax  for today and then regular bowel prep to begin this evening. -Monitor Hgb.  Harlene BIRCH. Zehr  02/12/2024, 9:48 AM     Attending Physician's Attestation   I have taken an interval history, reviewed the chart and examined the patient.   This is a patient that the GI service is asked to evaluate in the setting of acute blood loss anemia, rectal bleeding (maroonish stool per report) with abdominal pain.  Patient describes that over year she has had intermittent issues with generalized abdominal pain.  When she has this at home, she takes pain narcotic medication this can help the pain.  That pain medicine is prescribed for her back pain but it can be helpful for abdominal pain if that occurs.  Patient started having progressive recurrent abdominal pain in the lower abdomen within the last 3 to 4 days and subsequently had change in her bowel habits.  She normally uses the restroom on an every other day pattern but she started using the bathroom on a more frequent basis with the dark maroon-colored stool coming out more frequently.  As result of continued issues she came in for further evaluation and was found to have a slight anemia as well as slight leukocytosis.  I have reviewed her cross-sectional imaging itself and I do see concerns for significant fecal retention and constipation but no overt signs of colitis or ischemic colitis or stercoral colitis.  She does not have diverticulosis on prior colonoscopy from 5 years ago but she does have a history of adenomas.  At this point I recommend that we proceed with a colonoscopic evaluation. The risks and benefits of endoscopic evaluation were discussed with the patient; these include but are not limited to the risk of perforation, infection, bleeding, missed lesions, lack of diagnosis, severe illness requiring  hospitalization, as well as anesthesia and sedation related illnesses.  The patient and/or family is agreeable to proceed.  We are planning this for tomorrow pending her cleanout tonight and tomorrow.  All patient questions were answered to the best of my ability, and the patient agrees to the aforementioned plan of action with follow-up as indicated.   I agree with the Advanced Practitioner's note, impression, and recommendations with updates and my documentation as noted above.  The majority of the medical decision making/process, formulation of the impression/plan  of action for the patient were performed by me with substantive portion of this encounter (>50% time spent including complete performance of at least one of the key components of MDM, History, and/or Exam).   Aloha Finner, MD [facility] Advanced Endoscopy Office # [phone]

## 2024-02-13 ENCOUNTER — Inpatient Hospital Stay (HOSPITAL_COMMUNITY): Admitting: Certified Registered Nurse Anesthetist

## 2024-02-13 ENCOUNTER — Encounter (HOSPITAL_COMMUNITY)
Admission: EM | Disposition: A | Discharge status: Home / Self Care | Payer: Self-pay | Source: Home / Self Care | Attending: Internal Medicine

## 2024-02-13 ENCOUNTER — Other Ambulatory Visit (HOSPITAL_COMMUNITY): Payer: Self-pay

## 2024-02-13 ENCOUNTER — Encounter (HOSPITAL_COMMUNITY): Payer: Self-pay | Admitting: Internal Medicine

## 2024-02-13 ENCOUNTER — Encounter: Payer: Self-pay | Admitting: Internal Medicine

## 2024-02-13 DIAGNOSIS — I10 Essential (primary) hypertension: Secondary | ICD-10-CM

## 2024-02-13 DIAGNOSIS — K922 Gastrointestinal hemorrhage, unspecified: Secondary | ICD-10-CM | POA: Diagnosis not present

## 2024-02-13 DIAGNOSIS — K562 Volvulus: Secondary | ICD-10-CM

## 2024-02-13 DIAGNOSIS — I2510 Atherosclerotic heart disease of native coronary artery without angina pectoris: Secondary | ICD-10-CM

## 2024-02-13 DIAGNOSIS — Z1211 Encounter for screening for malignant neoplasm of colon: Secondary | ICD-10-CM | POA: Diagnosis not present

## 2024-02-13 DIAGNOSIS — K649 Unspecified hemorrhoids: Secondary | ICD-10-CM

## 2024-02-13 DIAGNOSIS — K642 Third degree hemorrhoids: Secondary | ICD-10-CM | POA: Diagnosis not present

## 2024-02-13 DIAGNOSIS — K6289 Other specified diseases of anus and rectum: Secondary | ICD-10-CM

## 2024-02-13 DIAGNOSIS — K5900 Constipation, unspecified: Secondary | ICD-10-CM

## 2024-02-13 HISTORY — PX: COLONOSCOPY: SHX5424

## 2024-02-13 LAB — CBC
HCT: 37.7 % (ref 36.0–46.0)
Hemoglobin: 11.1 g/dL — ABNORMAL LOW (ref 12.0–15.0)
MCH: 25 pg — ABNORMAL LOW (ref 26.0–34.0)
MCHC: 29.4 g/dL — ABNORMAL LOW (ref 30.0–36.0)
MCV: 84.9 fL (ref 80.0–100.0)
Platelets: 351 K/uL (ref 150–400)
RBC: 4.44 MIL/uL (ref 3.87–5.11)
RDW: 17.2 % — ABNORMAL HIGH (ref 11.5–15.5)
WBC: 10.9 K/uL — ABNORMAL HIGH (ref 4.0–10.5)
nRBC: 0 % (ref 0.0–0.2)

## 2024-02-13 LAB — HEMOGLOBIN AND HEMATOCRIT, BLOOD
HCT: 39.4 % (ref 36.0–46.0)
Hemoglobin: 12.1 g/dL (ref 12.0–15.0)

## 2024-02-13 LAB — BASIC METABOLIC PANEL WITH GFR
Anion gap: 16 — ABNORMAL HIGH (ref 5–15)
BUN: 7 mg/dL (ref 6–20)
CO2: 22 mmol/L (ref 22–32)
Calcium: 9.3 mg/dL (ref 8.9–10.3)
Chloride: 103 mmol/L (ref 98–111)
Creatinine, Ser: 0.77 mg/dL (ref 0.44–1.00)
GFR, Estimated: >60 mL/min (ref >60)
Glucose, Bld: 180 mg/dL — ABNORMAL HIGH (ref 70–99)
Potassium: 4.3 mmol/L (ref 3.5–5.1)
Sodium: 140 mmol/L (ref 135–145)

## 2024-02-13 SURGERY — COLONOSCOPY
Anesthesia: Monitor Anesthesia Care

## 2024-02-13 MED ORDER — HYDROCORTISONE 1 % EX CREA
TOPICAL_CREAM | Freq: Two times a day (BID) | CUTANEOUS | Status: DC
  Filled 2024-02-13: qty 28

## 2024-02-13 MED ORDER — CALCIUM POLYCARBOPHIL 625 MG PO TABS
625.0000 mg | ORAL_TABLET | Freq: Every day | ORAL | Status: DC
  Filled 2024-02-13: qty 1

## 2024-02-13 MED ORDER — FENTANYL CITRATE (PF) 100 MCG/2ML IJ SOLN
INTRAMUSCULAR | Status: AC
  Filled 2024-02-13: qty 2

## 2024-02-13 MED ORDER — FENTANYL CITRATE (PF) 100 MCG/2ML IJ SOLN
25.0000 ug | Freq: Once | INTRAMUSCULAR | Status: AC
  Administered 2024-02-13: 25 ug via INTRAVENOUS

## 2024-02-13 MED ORDER — HYDROCORTISONE 1 % EX CREA
TOPICAL_CREAM | Freq: Two times a day (BID) | CUTANEOUS | 0 refills | Status: AC
  Filled 2024-02-13: qty 28, 28d supply, fill #0

## 2024-02-13 MED ORDER — HYDROCORTISONE ACETATE 25 MG RE SUPP
25.0000 mg | Freq: Every day | RECTAL | 0 refills | Status: AC
  Filled 2024-02-13: qty 12, 12d supply, fill #0

## 2024-02-13 MED ORDER — POLYETHYLENE GLYCOL 3350 17 GM/SCOOP PO POWD
17.0000 g | Freq: Every day | ORAL | 0 refills | Status: AC
  Filled 2024-02-13: qty 238, 14d supply, fill #0

## 2024-02-13 MED ORDER — MIDAZOLAM HCL 2 MG/2ML IJ SOLN
INTRAMUSCULAR | Status: AC
  Filled 2024-02-13: qty 2

## 2024-02-13 MED ORDER — PROPOFOL 500 MG/50ML IV EMUL
INTRAVENOUS | Status: DC | PRN
  Administered 2024-02-13: 125 ug/kg/min via INTRAVENOUS

## 2024-02-13 MED ORDER — CALCIUM POLYCARBOPHIL 625 MG PO TABS
625.0000 mg | ORAL_TABLET | Freq: Every day | ORAL | 0 refills | Status: AC
  Filled 2024-02-13: qty 30, 30d supply, fill #0

## 2024-02-13 MED ORDER — PROPOFOL 10 MG/ML IV BOLUS
INTRAVENOUS | Status: DC | PRN
  Administered 2024-02-13: 50 mg via INTRAVENOUS

## 2024-02-13 MED ORDER — AMISULPRIDE (ANTIEMETIC) 5 MG/2ML IV SOLN: 10.0000 mg | Freq: Once | INTRAVENOUS | Status: DC | PRN

## 2024-02-13 MED ORDER — HYOSCYAMINE SULFATE 0.125 MG SL SUBL
0.2500 mg | SUBLINGUAL_TABLET | Freq: Once | SUBLINGUAL | Status: AC
  Administered 2024-02-13: 0.25 mg via ORAL
  Filled 2024-02-13: qty 2

## 2024-02-13 MED ORDER — POLYETHYLENE GLYCOL 3350 17 G PO PACK
17.0000 g | PACK | Freq: Every day | ORAL | Status: DC
  Filled 2024-02-13: qty 1

## 2024-02-13 MED ORDER — SODIUM CHLORIDE 0.9 % IV SOLN: INTRAVENOUS | Status: DC | PRN

## 2024-02-13 MED ORDER — HYDROCORTISONE ACETATE 25 MG RE SUPP
25.0000 mg | Freq: Every day | RECTAL | Status: DC
  Filled 2024-02-13: qty 1

## 2024-02-13 NOTE — Op Note (Signed)
 [facility] Patient Name: [name] Procedure Date: 02/13/2024 MRN: [id number] Attending MD: Aloha Finner , MD, [id number] Date of Birth: [birth year] CSN: [id number] Age: 53 Admit Type: Inpatient Procedure:                Colonoscopy Indications:              High risk colon cancer surveillance: Personal                            history of colonic polyps, Incidental - Hematochezia Providers:                Aloha Finner, MD, Hoy Penner, RN, Lorrayne Kitty, Technician Referring MD:              Medicines:                Monitored Anesthesia Care Complications:            No immediate complications. Estimated Blood Loss:     Estimated blood loss was minimal. Procedure:                After obtaining informed consent, the colonoscope                            was passed under direct vision. Throughout the                            procedure, the patient's blood pressure, pulse, and                            oxygen  saturations were monitored continuously. The                            CF-HQ190L ([id number] Olympus colonoscope was                            introduced through the anus and advanced to the the                            cecum, identified by appendiceal orifice and                            ileocecal valve. The colonoscopy was performed                            without difficulty. The patient tolerated the                            procedure. The quality of the bowel preparation was                            adequate. The ileocecal valve, appendiceal orifice,  and rectum were photographed. Scope In: 1:47:57 PM Scope Out: 2:13:07 PM Total Procedure Duration: 0 hours 25 minutes 10 seconds  Findings:      The digital rectal exam findings include hemorrhoids. Pertinent       negatives include no palpable rectal lesions.      Extensive amounts of semi-liquid stool was found in the  entire colon,       interfering with visualization. Lavage of the area was performed using       copious amounts, resulting in clearance with adequate visualization.      The colon (entire examined portion) revealed moderately excessive       looping.      Patchy moderate inflammation characterized by erosions, friability and       granularity was found in the rectum. Biopsies were taken with a cold       forceps for histology.      Normal mucosa was found in the entire colon otherwise.      Non-bleeding non-thrombosed external and internal hemorrhoids were found       during retroflexion, during perianal exam and during digital exam. The       hemorrhoids were Grade III (internal hemorrhoids that prolapse but       require manual reduction). Impression:               - Hemorrhoids found on digital rectal exam.                           - Stool in the entire examined colon - lavaged with                            adequate visualization.                           - There was significant looping of the colon.                           - Patchy moderate inflammation was found in the                            rectum secondary to possible underlying proctitis.                            Biopsied.                           - Normal mucosa in the entire examined colon                            otherwise.                           - Non-bleeding non-thrombosed external and internal                            hemorrhoids. Moderate Sedation:      Not Applicable - Patient had care per Anesthesia. Recommendation:           - The patient will be observed post-procedure,  until all discharge criteria are met.                           - Discharge patient to home.                           - Patient has a contact number available for                            emergencies. The signs and symptoms of potential                            delayed complications were discussed  with the                            patient. Return to normal activities tomorrow.                            Written discharge instructions were provided to the                            patient.                           - High fiber diet.                           - Use FiberCon 1-2 tablets PO daily.                           - Continue present medications.                           -Try to minimize patient getting constipated so                            recommend daily MiraLAX .                           - Await pathology results.                           - Repeat colonoscopy in 7 years for surveillance                            due to history of previous polyps. If evidence of                            chronic proctitis is noted, earlier surveillance                            may be required.                           - Anusol  suppositories nightly for 2 weeks then  every other night.                           - The findings and recommendations were discussed                            with the patient.                           - The findings and recommendations were discussed                            with the referring physician. Procedure Code(s):        --- Professional ---                           [phone], Colonoscopy, flexible; with biopsy, single                            or multiple Diagnosis Code(s):        --- Professional ---                           Z86.010, Personal history of colonic polyps                           K64.2, Third degree hemorrhoids                           K62.89, Other specified diseases of anus and rectum CPT copyright 2022 [employer]. All rights reserved. The codes documented in this report are preliminary and upon coder review may  be revised to meet current compliance requirements. Aloha Finner, MD 02/13/2024 2:25:08 PM Number of Addenda: 0

## 2024-02-13 NOTE — Progress Notes (Signed)
 PROGRESS NOTE [name]  FMW:[id number] DOB: 31-Jan-1971 DOA: 02/11/2024 PCP: [name], PA-C      Brief Narrative/Hospital Course: [name] is a 53 y.o. female with PMH of diabetes, hypertension, hyperlipidemia, depression/PTSD, hypothyroidism CAD chronic low back pain GERD/Barrett's esophagus presented with 5 days of abdominal pain and rectal bleeding also low back pain. In the ED patient was FOBT positive GI was consulted.  Labs with leukocytosis hemoglobin 9.3 UA unremarkable CMP stable CT abdomen pelvis with contrast: Moderate fecal retention consistent with constipation no bowel obstruction or ileus, hepatic steatosis noted Colonoscopy planned for 10/23  Subjective: Having lots of stools, says there is still some brown in her most recent stool  Assessment and plan:  Lower GI bleed Rectal bleed FOBT positive GERD/history of Barrett's: -Await colonoscopy  Chronic pain syndrome Chronic low back pain: Continue multimodal pain management-with oxycodone , Cymbalta , tizanidine  and pregabalin   PTSD/depression/BPD: Continue home Abilify , clonazepam , Cymbalta   Type 2 diabetes mellitus without complication, without long-term current use of insulin : Blood sugar remains stable, on weekly Mounjaro   Essential hypertension: BP uncontrolled resume home Coreg , prazosin  . Add prn iv hydralazine  5 mg  Hypothyroidism Resume synthroid   Class I Obesity w/ Body mass index is 33.81 kg/m.: Will benefit with PCP follow-up, weight loss,healthy lifestyle and outpatient sleep eval if not done.  DVT prophylaxis: SCDs Start: 02/11/24 2358 Code Status:   Code Status: Full Code Family Communication: plan of care discussed with patient at bedside.      Objective: Vitals last 24 hrs: Vitals:   02/12/24 0400 02/12/24 0500 02/12/24 0730 02/12/24 0800  BP: (!) 160/93  (!) 147/80 115/66  Pulse:  91 84 86  Resp: 13 19 16 11   Temp:   98.3 F (36.8 C)   TempSrc:   Oral    SpO2:  93% 94% 92%  Weight:      Height:        Physical Examination:  General: Appearance:    Obese female in no acute distress     Lungs:      respirations unlabored  Heart:    Tachycardic.   MS:   All extremities are intact.   Neurologic:   Awake, alert     Medications reviewed:  Scheduled Meds:  ARIPiprazole   10 mg Oral Daily   atorvastatin   20 mg Oral Daily   carvedilol   6.25 mg Oral BID WC   DULoxetine   60 mg Oral BID   [START ON 02/13/2024] levothyroxine   50 mcg Oral Q0600   pantoprazole  (PROTONIX ) IV  40 mg Intravenous Q12H   prazosin   5 mg Oral QHS   trazodone   600 mg Oral QHS   Continuous Infusions:  dextrose  5% lactated ringers  100 mL/hr at 02/12/24 0220       Data Reviewed: I have personally reviewed following labs and imaging studies ( see epic result tab) CBC: Recent Labs  Lab 02/11/24 2007 02/12/24 0204 02/12/24 1839 02/13/24 0606  WBC 15.4* 14.1*  --   --   NEUTROABS 11.5*  --   --   --   HGB 11.3* 11.4* 11.0* 12.1  HCT 38.0 38.3 37.9 39.4  MCV 84.4 83.6  --   --   PLT 347 368  --   --    CMP: Recent Labs  Lab 02/11/24 1503 02/13/24 0606  NA 137 140  K 4.3 4.3  CL 99 103  CO2 24 22  GLUCOSE 135* 180*  BUN 7 7  CREATININE 0.75 0.77  CALCIUM  9.4 9.3   GFR: Estimated Creatinine Clearance: 88.1 mL/min (by C-G formula based on SCr of 0.77 mg/dL). Recent Labs  Lab 02/11/24 1503  AST 24  ALT 19  ALKPHOS 124  BILITOT 0.4  PROT 7.8  ALBUMIN  3.8    Recent Labs  Lab 02/11/24 1503  LIPASE 11   No results for input(s): AMMONIA in the last 168 hours. Coagulation Profile: No results for input(s): INR, PROTIME in the last 168 hours. Unresulted Labs (From admission, onward)     Start     Ordered   02/13/24 0940  CBC  Once,   R        02/13/24 0940   02/11/24 1447  CBC with Differential  Once,   STAT        02/11/24 1447   Signed and Held  Comprehensive metabolic panel  Tomorrow morning,   R        Signed and Held            Antimicrobials/Microbiology: Anti-infectives (From admission, onward)    None         Component Value Date/Time   SDES  12/31/2023 2000    BLOOD SITE NOT SPECIFIED Performed at [facility], [address], [place], [place] [address]    SPECREQUEST  12/31/2023 2000    Blood Culture adequate volume BOTTLES DRAWN AEROBIC AND ANAEROBIC Performed at [facility], [address] [place], [place] [address]    CULT  12/31/2023 2000    NO GROWTH 5 DAYS Performed at [facility], [address] [place], [place] [address]    REPTSTATUS 01/06/2024 FINAL 12/31/2023 2000    Procedures: Procedure(s) (LRB): COLONOSCOPY (N/A)   Harlene RAYMOND Bowl, DO [facility] 02/13/2024, 10:33 AM

## 2024-02-13 NOTE — Discharge Instructions (Addendum)
 On colonoscopy: you have large hemorrhoids -- recommend Anusol  suppositories and Preparation H.  Repeat colonoscopy in 7 years High fiber diet: Use FiberCon 1-2 tablets PO daily and Try to minimize  getting constipated so recommend daily MiraLAX .

## 2024-02-13 NOTE — Anesthesia Preprocedure Evaluation (Signed)
 Anesthesia Evaluation  Patient identified by MRN, date of birth, ID band Patient awake    Reviewed: Allergy  & Precautions, NPO status , Patient's Chart, lab work & pertinent test results  Airway Mallampati: I  TM Distance: >3 FB Neck ROM: Full    Dental  (+) Edentulous Upper, Edentulous Lower   Pulmonary neg pulmonary ROS   breath sounds clear to auscultation       Cardiovascular hypertension, + CAD   Rhythm:Regular Rate:Normal     Neuro/Psych  Headaches PSYCHIATRIC DISORDERS Anxiety Depression Bipolar Disorder    Neuromuscular disease    GI/Hepatic Neg liver ROS,GERD  ,,  Endo/Other  diabetesHypothyroidism    Renal/GU Renal disease     Musculoskeletal  (+) Arthritis ,    Abdominal   Peds  Hematology  (+) Blood dyscrasia, anemia   Anesthesia Other Findings   Reproductive/Obstetrics                              Anesthesia Physical Anesthesia Plan  ASA: 3  Anesthesia Plan: MAC   Post-op Pain Management: Minimal or no pain anticipated   Induction: Intravenous  PONV Risk Score and Plan: 0 and Propofol  infusion  Airway Management Planned: Natural Airway and Nasal Cannula  Additional Equipment: None  Intra-op Plan:   Post-operative Plan:   Informed Consent: I have reviewed the patients History and Physical, chart, labs and discussed the procedure including the risks, benefits and alternatives for the proposed anesthesia with the patient or authorized representative who has indicated his/her understanding and acceptance.       Plan Discussed with: CRNA  Anesthesia Plan Comments:         Anesthesia Quick Evaluation

## 2024-02-13 NOTE — Addendum Note (Signed)
 Addendum  created 02/13/24 1514 by Tilford Franky BIRCH, MD   Clinical Note Signed

## 2024-02-13 NOTE — Anesthesia Postprocedure Evaluation (Addendum)
 Anesthesia Post Note  Patient: [name]  Procedure(s) Performed: COLONOSCOPY     Patient location during evaluation: PACU Anesthesia Type: MAC Level of consciousness: awake and alert Pain management: pain level controlled Vital Signs Assessment: post-procedure vital signs reviewed and stable Respiratory status: spontaneous breathing, nonlabored ventilation, respiratory function stable and patient connected to nasal cannula oxygen  Cardiovascular status: stable and blood pressure returned to baseline Postop Assessment: no apparent nausea or vomiting Anesthetic complications: no Comments: IV infiltrated during procedure. Pt communicative during period of procedure with minimal anesthetic. Ms. Bodenheimer denied discomfort during this portion of the procedure. CRNA and Endo team called MDA. IV placed by MDA during procedure and sedation resumed until procedure completion.    No notable events documented.  Last Vitals:  Vitals:   02/13/24 1440 02/13/24 1450  BP: (!) 140/78 133/87  Pulse: 79 80  Resp: 12 14  Temp:    SpO2: 97% 98%                [name]

## 2024-02-13 NOTE — Transfer of Care (Signed)
 Immediate Anesthesia Transfer of Care Note  Patient: [name]  Procedure(s) Performed: COLONOSCOPY  Patient Location: Endoscopy Unit  Anesthesia Type:MAC  Level of Consciousness: awake  Airway & Oxygen  Therapy: Patient Spontanous Breathing  Post-op Assessment: Report given to RN and Post -op Vital signs reviewed and stable  Post vital signs: Reviewed and stable  Last Vitals:  Vitals Value Taken Time  BP    Temp    Pulse    Resp    SpO2      Last Pain:  Vitals:   02/13/24 1233  TempSrc: Temporal  PainSc: 0-No pain      Patients Stated Pain Goal: 0 (02/13/24 1233)  Complications: No notable events documented.

## 2024-02-13 NOTE — Interval H&P Note (Signed)
 History and Physical Interval Note:  02/13/2024 1:09 PM  [name]  has presented today for surgery, with the diagnosis of GI bleed.  The various methods of treatment have been discussed with the patient and family. After consideration of risks, benefits and other options for treatment, the patient has consented to  Procedure(s): COLONOSCOPY (N/A) as a surgical intervention.  The patient's history has been reviewed, patient examined, no change in status, stable for surgery.  I have reviewed the patient's chart and labs.  Questions were answered to the patient's satisfaction.     Kristelle Cavallaro Mansouraty Jr

## 2024-02-13 NOTE — Discharge Summary (Signed)
 Physician Discharge Summary  [name] FMW:[id number] DOB: 1970/11/14 DOA: 02/11/2024  PCP: [name], PA-C  Admit date: 02/11/2024 Discharge date: 02/13/2024  Admitted From:  Discharge disposition: Home   Recommendations for Outpatient Follow-Up:   Follow-up results of colonoscopy   Discharge Diagnosis:   Principal Problem:   Lower GI bleed Active Problems:   Chronic pain syndrome   Type 2 diabetes mellitus without complication, without long-term current use of insulin  (HCC)   Barrett's esophagus   Essential hypertension   Bipolar affective disorder (HCC)   Hypothyroidism   PTSD (post-traumatic stress disorder)   Constipation    Discharge Condition: Improved.  Diet recommendation:   Carbohydrate-modified.   Wound care: None.  Code status: Full.   History of Present Illness:   [name] is a 53 y.o. female with medical history significant of type 2 diabetes, depression, GERD, essential hypertension, hypothyroidism, PTSD, hyperlipidemia, coronary artery disease, chronic low back pain and Barrett's esophagus who was brought in from home by family with 5 days of abdominal pain and rectal bleed.  Patient is also complaining of low back pain.  She apparently had recent surgery.  They reported multiple episode of rectal bleed.  Patient was seen and evaluated.  Hemoglobin is stable.  She is guaiac positive.  Patient follows with [facility].  She has been admitted for evaluation of lower GI bleed.  Her previous hemoglobin was 13.7 about a month ago but down to 11.3 today.    Hospital Course by Problem:   Lower GI bleed Rectal bleed FOBT positive GERD/history of Barrett's: - Status post colonoscopy:    - High fiber diet.                           - Use FiberCon 1-2 tablets PO daily.                           - Continue present medications.                           -Try to minimize patient getting constipated so                             recommend daily MiraLAX .                           - Await pathology results.                           - Repeat colonoscopy in 7 years for surveillance                            due to history of previous polyps. If evidence of                            chronic proctitis is noted, earlier surveillance                            may be required.                           -  Anusol  suppositories nightly for 2 weeks then                            every other night.   Chronic pain syndrome Chronic low back pain: Continue multimodal pain management-with oxycodone , Cymbalta , tizanidine  and pregabalin    PTSD/depression/BPD: Continue home Abilify , clonazepam , Cymbalta    Type 2 diabetes mellitus without complication, without long-term current use of insulin : Blood sugar remains stable, on weekly Mounjaro    Essential hypertension: -Resume home meds   Hypothyroidism Resume synthroid    Class I Obesity w/ Body mass index is 33.81 kg/m.: Will benefit with PCP follow-up, weight loss,healthy lifestyle and outpatient sleep eval if not done.    Medical Consultants:   GI   Discharge Exam:   Vitals:   02/13/24 1440 02/13/24 1450  BP: (!) 140/78 133/87  Pulse: 79 80  Resp: 12 14  Temp:    SpO2: 97% 98%   Vitals:   02/13/24 1430 02/13/24 1436 02/13/24 1440 02/13/24 1450  BP: (!) 142/80  (!) 140/78 133/87  Pulse: 81  79 80  Resp: 18  12 14   Temp:      TempSrc:      SpO2: 100% 99% 97% 98%  Weight:      Height:        General exam: Appears calm and comfortable.    The results of significant diagnostics from this hospitalization (including imaging, microbiology, ancillary and laboratory) are listed below for reference.     Procedures and Diagnostic Studies:   CT ABDOMEN PELVIS W CONTRAST Result Date: 02/11/2024 CLINICAL DATA:  Bilateral lower abdominal pain, blood in stool for 3 days, vomiting EXAM: CT ABDOMEN AND PELVIS WITH CONTRAST TECHNIQUE: Multidetector CT imaging  of the abdomen and pelvis was performed using the standard protocol following bolus administration of intravenous contrast. RADIATION DOSE REDUCTION: This exam was performed according to the departmental dose-optimization program which includes automated exposure control, adjustment of the mA and/or kV according to patient size and/or use of iterative reconstruction technique. CONTRAST:  OMNIPAQUE  IOHEXOL  300 MG/ML  SOLN COMPARISON:  12/31/2023 FINDINGS: Lower chest: No acute pleural or parenchymal lung disease. Hepatobiliary: Hepatic steatosis. No focal liver abnormality. The gallbladder is unremarkable. Pancreas: Unremarkable. No pancreatic ductal dilatation or surrounding inflammatory changes. Spleen: Normal in size without focal abnormality. Adrenals/Urinary Tract: Adrenal glands are unremarkable. Kidneys are normal, without renal calculi, focal lesion, or hydronephrosis. Bladder is unremarkable. Stomach/Bowel: No bowel obstruction or ileus. Moderate fecal retention throughout the colon consistent with constipation. Normal retrocecal appendix. No bowel wall thickening or inflammatory change. Vascular/Lymphatic: Aortic atherosclerosis. No enlarged abdominal or pelvic lymph nodes. Reproductive: Uterus and bilateral adnexa are unremarkable. Other: No free fluid or free intraperitoneal gas. No abdominal wall hernia. Musculoskeletal: No acute or destructive bony abnormalities. Postsurgical changes are seen from spinal fusion spanning the thoracolumbar junction. Reconstructed images demonstrate no additional findings. IMPRESSION: 1. Moderate fecal retention consistent with constipation. No bowel obstruction or ileus. 2. Hepatic steatosis. 3.  Aortic Atherosclerosis (ICD10-I70.0). Electronically Signed   By: Ozell Daring M.D.   On: 02/11/2024 16:51     Labs:   Basic Metabolic Panel: Recent Labs  Lab 02/13/24 0606  NA 140  K 4.3  CL 103  CO2 22  GLUCOSE 180*  BUN 7  CREATININE 0.77  CALCIUM  9.3    GFR Estimated Creatinine Clearance: 88.1 mL/min (by C-G formula based on SCr of 0.77 mg/dL). Liver Function Tests: No results for  input(s): AST, ALT, ALKPHOS, BILITOT, PROT, ALBUMIN  in the last 168 hours.  No results for input(s): LIPASE, AMYLASE in the last 168 hours.  No results for input(s): AMMONIA in the last 168 hours. Coagulation profile No results for input(s): INR, PROTIME in the last 168 hours.  CBC: Recent Labs  Lab 02/12/24 1839 02/13/24 0606 02/13/24 1023  WBC  --   --  10.9*  HGB 11.0* 12.1 11.1*  HCT 37.9 39.4 37.7  MCV  --   --  84.9  PLT  --   --  351   Cardiac Enzymes: No results for input(s): CKTOTAL, CKMB, CKMBINDEX, TROPONINI in the last 168 hours. BNP: Invalid input(s): POCBNP CBG: No results for input(s): GLUCAP in the last 168 hours. D-Dimer No results for input(s): DDIMER in the last 72 hours. Hgb A1c No results for input(s): HGBA1C in the last 72 hours. Lipid Profile No results for input(s): CHOL, HDL, LDLCALC, TRIG, CHOLHDL, LDLDIRECT in the last 72 hours. Thyroid  function studies No results for input(s): TSH, T4TOTAL, T3FREE, THYROIDAB in the last 72 hours.  Invalid input(s): FREET3 Anemia work up No results for input(s): VITAMINB12, FOLATE, FERRITIN, TIBC, IRON , RETICCTPCT in the last 72 hours. Microbiology No results found for this or any previous visit (from the past 240 hours).   Discharge Instructions:   Discharge Instructions     Diet - low sodium heart healthy   Complete by: As directed    Increase activity slowly   Complete by: As directed       Allergies as of 02/13/2024   No Known Allergies      Medication List     TAKE these medications    acetaminophen  650 MG CR tablet Commonly known as: TYLENOL  Take 1,300 mg by mouth every 8 (eight) hours as needed for pain.   ARIPiprazole  10 MG tablet Commonly known as: Abilify  Take 1 tablet (10  mg total) by mouth daily.   atorvastatin  20 MG tablet Commonly known as: LIPITOR Take 1 tablet (20 mg total) by mouth daily.   carvedilol  6.25 MG tablet Commonly known as: COREG  Take 1 tablet (6.25 mg total) by mouth 2 (two) times daily with a meal.   clonazePAM  0.5 MG tablet Commonly known as: KLONOPIN  Take 1 tablet (0.5 mg total) by mouth daily as needed for severe breakthrough panic/anxiety   DULoxetine  60 MG capsule Commonly known as: CYMBALTA  Take 1 capsule (60 mg total) by mouth 2 (two) times daily.   hydrocortisone  25 MG suppository Commonly known as: ANUSOL -HC Place 1 suppository (25 mg total) rectally at bedtime.   hydrocortisone  cream 1 % Apply topically 2 (two) times daily.   levothyroxine  50 MCG tablet Commonly known as: SYNTHROID  Take 50 mcg by mouth daily before breakfast.   metoCLOPramide  5 MG tablet Commonly known as: REGLAN  Take 1 tablet (5 mg total) by mouth every 8 (eight) hours as needed for nausea or vomiting.   Mounjaro  7.5 MG/0.5ML Pen Generic drug: tirzepatide  Inject 7.5 mg into the skin once a week.   multivitamin Tabs tablet Take 1 tablet by mouth at bedtime.   Nephro Vitamins 0.8 MG Tabs Take 1 tablet by mouth daily.   ondansetron  8 MG disintegrating tablet Commonly known as: ZOFRAN -ODT Dissilve 1 tablet (8 mg total) by mouth every 8 (eight) hours as needed for nausea and vomiting.   oxyCODONE  15 MG immediate release tablet Commonly known as: ROXICODONE  Take 1 tablet (15 mg total) by mouth every 6 (six) hours as needed.  pantoprazole  40 MG tablet Commonly known as: PROTONIX  Take 1 tablet by mouth twice daily   polycarbophil 625 MG tablet Commonly known as: FIBERCON Take 1 tablet (625 mg total) by mouth daily.   polyethylene glycol powder 17 GM/SCOOP powder Commonly known as: GLYCOLAX /MIRALAX  Take 17 g by mouth daily. Dissolve 1 capful (17g) in 4-8 ounces of liquid and take by mouth daily.   prazosin  5 MG capsule Commonly  known as: MINIPRESS  Take 1 capsule (5 mg total) by mouth at bedtime.   pregabalin  50 MG capsule Commonly known as: LYRICA  Take 50 mg by mouth 2 (two) times daily.   Prolia 60 MG/ML Sosy injection Generic drug: denosumab Inject 60 mg into the skin every 6 (six) months.   promethazine  25 MG tablet Commonly known as: PHENERGAN  Take 1 tablet (25 mg total) by mouth 3 (three) times daily as needed.   tiZANidine  4 MG tablet Commonly known as: ZANAFLEX  Take 1 tablet (4 mg total) by mouth every 6 (six) hours as needed.   trazodone  300 MG tablet Commonly known as: DESYREL  Take 2 tablets (600 mg total) by mouth at bedtime.   Vitamin D  (Ergocalciferol ) 1.25 MG (50000 UNIT) Caps capsule Commonly known as: DRISDOL  Take 1 capsule (50,000 Units total) by mouth every 7 (seven) days.          Time coordinating discharge: 45 minutes  Signed:  Harlene RAYMOND Bowl DO  [facility] 02/19/2024, 12:56 PM

## 2024-02-13 NOTE — Plan of Care (Signed)

## 2024-02-13 NOTE — Progress Notes (Signed)
 Discharge medications delivered to patient at the bedside in a secure bag.

## 2024-02-17 ENCOUNTER — Ambulatory Visit: Payer: Self-pay | Admitting: Gastroenterology

## 2024-02-17 ENCOUNTER — Other Ambulatory Visit (HOSPITAL_COMMUNITY): Payer: Self-pay

## 2024-02-17 ENCOUNTER — Encounter (HOSPITAL_COMMUNITY): Payer: Self-pay | Admitting: Gastroenterology

## 2024-02-17 LAB — SURGICAL PATHOLOGY

## 2024-02-18 ENCOUNTER — Other Ambulatory Visit (HOSPITAL_COMMUNITY): Payer: Self-pay

## 2024-02-19 ENCOUNTER — Other Ambulatory Visit (HOSPITAL_COMMUNITY): Payer: Self-pay

## 2024-02-20 ENCOUNTER — Emergency Department (HOSPITAL_COMMUNITY)
Admission: EM | Admit: 2024-02-20 | Discharge: 2024-02-20 | Disposition: A | Discharge status: Home / Self Care | Attending: Emergency Medicine | Admitting: Emergency Medicine

## 2024-02-20 ENCOUNTER — Emergency Department (HOSPITAL_COMMUNITY)

## 2024-02-20 DIAGNOSIS — D72829 Elevated white blood cell count, unspecified: Secondary | ICD-10-CM | POA: Insufficient documentation

## 2024-02-20 DIAGNOSIS — R1031 Right lower quadrant pain: Secondary | ICD-10-CM | POA: Insufficient documentation

## 2024-02-20 DIAGNOSIS — I2510 Atherosclerotic heart disease of native coronary artery without angina pectoris: Secondary | ICD-10-CM | POA: Insufficient documentation

## 2024-02-20 DIAGNOSIS — Z85820 Personal history of malignant melanoma of skin: Secondary | ICD-10-CM | POA: Diagnosis not present

## 2024-02-20 DIAGNOSIS — I10 Essential (primary) hypertension: Secondary | ICD-10-CM | POA: Diagnosis not present

## 2024-02-20 DIAGNOSIS — E119 Type 2 diabetes mellitus without complications: Secondary | ICD-10-CM | POA: Diagnosis not present

## 2024-02-20 DIAGNOSIS — R109 Unspecified abdominal pain: Secondary | ICD-10-CM

## 2024-02-20 DIAGNOSIS — E039 Hypothyroidism, unspecified: Secondary | ICD-10-CM | POA: Insufficient documentation

## 2024-02-20 LAB — COMPREHENSIVE METABOLIC PANEL WITH GFR
ALT: 10 U/L (ref 0–44)
AST: 15 U/L (ref 15–41)
Albumin: 4.4 g/dL (ref 3.5–5.0)
Alkaline Phosphatase: 123 U/L (ref 38–126)
Anion gap: 16 — ABNORMAL HIGH (ref 5–15)
BUN: 11 mg/dL (ref 6–20)
CO2: 23 mmol/L (ref 22–32)
Calcium: 9.7 mg/dL (ref 8.9–10.3)
Chloride: 102 mmol/L (ref 98–111)
Creatinine, Ser: 0.78 mg/dL (ref 0.44–1.00)
GFR, Estimated: >60 mL/min (ref >60)
Glucose, Bld: 200 mg/dL — ABNORMAL HIGH (ref 70–99)
Potassium: 3.9 mmol/L (ref 3.5–5.1)
Sodium: 141 mmol/L (ref 135–145)
Total Bilirubin: 0.4 mg/dL (ref 0.0–1.2)
Total Protein: 8.8 g/dL — ABNORMAL HIGH (ref 6.5–8.1)

## 2024-02-20 LAB — CBC
HCT: 40.9 % (ref 36.0–46.0)
Hemoglobin: 12.5 g/dL (ref 12.0–15.0)
MCH: 25 pg — ABNORMAL LOW (ref 26.0–34.0)
MCHC: 30.6 g/dL (ref 30.0–36.0)
MCV: 81.8 fL (ref 80.0–100.0)
Platelets: 480 K/uL — ABNORMAL HIGH (ref 150–400)
RBC: 5 MIL/uL (ref 3.87–5.11)
RDW: 16.7 % — ABNORMAL HIGH (ref 11.5–15.5)
WBC: 15.2 K/uL — ABNORMAL HIGH (ref 4.0–10.5)
nRBC: 0 % (ref 0.0–0.2)

## 2024-02-20 LAB — LIPASE, BLOOD: Lipase: 11 U/L (ref 11–51)

## 2024-02-20 MED ORDER — ONDANSETRON HCL 4 MG/2ML IJ SOLN
4.0000 mg | Freq: Once | INTRAMUSCULAR | Status: AC
  Administered 2024-02-20: 4 mg via INTRAVENOUS
  Filled 2024-02-20: qty 2

## 2024-02-20 MED ORDER — HYDROMORPHONE HCL 1 MG/ML IJ SOLN
0.5000 mg | Freq: Once | INTRAMUSCULAR | Status: AC
  Administered 2024-02-20: 0.5 mg via INTRAVENOUS
  Filled 2024-02-20: qty 1

## 2024-02-20 MED ORDER — IOHEXOL 300 MG/ML  SOLN
100.0000 mL | Freq: Once | INTRAMUSCULAR | Status: AC | PRN
  Administered 2024-02-20: 100 mL via INTRAVENOUS

## 2024-02-20 MED ORDER — LACTATED RINGERS IV BOLUS
1000.0000 mL | Freq: Once | INTRAVENOUS | Status: AC
  Administered 2024-02-20: 1000 mL via INTRAVENOUS

## 2024-02-20 NOTE — ED Triage Notes (Signed)
 C/o N/V and abdominal pain starting last night. Unknown cause. Same occurred in the past, but unsure of when or the cause.

## 2024-02-20 NOTE — Discharge Instructions (Signed)
 Please return as needed for worsening symptoms.

## 2024-02-20 NOTE — ED Provider Notes (Signed)
 [place] EMERGENCY DEPARTMENT AT [facility] Provider Note  CSN: [id number] Arrival date & time: 02/20/24 1151  Chief Complaint(s) Nausea  HPI [name] is a 53 y.o. female history of diabetes, cyclical vomiting syndrome, recent admission for lower GI bleed found to have proctitis presenting to the emergency department abdominal pain.  Patient reports pain mainly in the lower abdomen, worse in the right lower quadrant.  Reports pain is present for the past 3 other days.  She reports she think she has had something similar previously but she is not sure.  Reports nausea and vomiting.  Denies any rectal bleeding, melena.  Did have recent colonoscopy during recent admission for rectal bleeding which showed proctitis.  Pathology shows benign findings.  Denies any fevers or chills.  Denies any urinary symptoms.   Past Medical History Past Medical History:  Diagnosis Date   Anxiety    Arthritis    joints ache all over (10/15/2014)   Barrett's esophagus    Bulging lumbar disc    Cardiac arrest (HCC)    Chronic lower back pain    Coronary artery disease    DDD (degenerative disc disease), cervical    Depression    DM (diabetes mellitus) (HCC)    Drug-seeking behavior    GERD (gastroesophageal reflux disease)    Headache    weekly (10/15/2014)   Hyperlipemia    Hypertension    Hypothyroidism    PTSD (post-traumatic stress disorder)    Skin cancer    had them cut off my arms; don't know what kind   Patient Active Problem List   Diagnosis Date Noted   Constipation 02/13/2024   Lower GI bleed 02/11/2024   Status post thoracic spinal fusion 11/06/2023   Valvular heart disease 10/31/2023   Encephalopathy 10/24/2023   AKI (acute kidney injury) 10/08/2023   Hypocalcemia 10/08/2023   Hyperproteinemia 10/08/2023   Positive urine drug screen 07/19/2022   Cyclic vomiting syndrome 07/18/2022   borderline prolonged QT interval 07/18/2022   Dysuria 07/18/2022    Mixed diabetic hyperlipidemia associated with type 2 diabetes mellitus (HCC) 06/27/2022   Compression fracture of fourth lumbar vertebra (HCC) 06/25/2022   RLQ abdominal pain 05/23/2022   Cyclical vomiting with nausea 05/21/2022   Colitis 03/22/2022   Acute colitis 03/22/2022   Dehydration 03/22/2022   Multiple rib fractures 02/03/2022   Barrett's esophagus    Cyclical vomiting syndrome 09/18/2021   Chronic pain syndrome 09/18/2021   Class 2 obesity due to excess calories with body mass index (BMI) of 37.0 to 37.9 in adult 09/18/2021   Hypothyroidism 09/18/2021   Starvation ketoacidosis 08/27/2021   Viral gastroenteritis 08/27/2021   Intractable vomiting 08/02/2021   Aortic atherosclerosis 08/02/2021   LFT elevation 08/02/2021   Hyperbilirubinemia 08/02/2021   Hypoalbuminemia 08/02/2021   Type 2 diabetes mellitus without complication, without long-term current use of insulin  (HCC) 08/02/2021   Anoxic brain damage (HCC) 08/16/2020   History of depression    Sleep disturbance    Anxiety state    N&V (nausea and vomiting)    Substance abuse (HCC)    Anemia of chronic disease    Acute metabolic encephalopathy 06/23/2020   Hematochezia    Acute blood loss anemia    Acute respiratory failure with hypoxia (HCC)    Non-traumatic rhabdomyolysis    Cardiac arrest (HCC) 06/08/2020   Fracture of ankle, bimalleolar, right, closed 03/28/2020   Iron  deficiency 05/05/2019   Symptomatic anemia 05/05/2019   Nausea & vomiting 12/26/2018  Essential hypertension 12/26/2018   ARF (acute renal failure) 12/26/2018   Intractable nausea and vomiting 11/28/2018   Bipolar affective disorder (HCC) 11/28/2018   Gastroenteritis 11/19/2015   Hypomagnesemia    C. difficile colitis    SIRS (systemic inflammatory response syndrome) (HCC) 01/11/2015   Cellulitis 01/03/2015   Opiate withdrawal (HCC) 12/18/2014   Depression with anxiety 12/17/2014   Herpes simplex virus type 1 (HSV-1) dermatitis     Sacral fracture (HCC) 12/12/2014   Fall at home, initial encounter 12/12/2014   Chronic lower back pain 12/12/2014   Sacral fracture, closed (HCC) 12/12/2014   Nausea with vomiting    Intractable abdominal pain 10/15/2014   Abdominal pain 10/15/2014   Hypokalemia 09/04/2014   Sepsis (HCC) 09/01/2014   Abdominal pain, generalized 09/01/2014   PTSD (post-traumatic stress disorder) 09/01/2014   Endometriosis 09/01/2014   Inappropriate sinus tachycardia 09/01/2014   Lactic acidosis 09/01/2014   Sepsis secondary to UTI (HCC) 09/01/2014   Home Medication(s) Prior to Admission medications   Medication Sig Start Date End Date Taking? Authorizing Provider  acetaminophen  (TYLENOL ) 650 MG CR tablet Take 1,300 mg by mouth every 8 (eight) hours as needed for pain. Patient not taking: Reported on 02/11/2024    [provider]  ARIPiprazole  (ABILIFY ) 10 MG tablet Take 1 tablet (10 mg total) by mouth daily. 10/16/23     atorvastatin  (LIPITOR) 20 MG tablet Take 1 tablet (20 mg total) by mouth daily. 09/28/23     B Complex-C-Folic Acid  (RENAL VITAMIN) 0.8 MG TABS Take 1 tablet by mouth daily. 01/24/24     carvedilol  (COREG ) 6.25 MG tablet Take 1 tablet (6.25 mg total) by mouth 2 (two) times daily with a meal. 10/26/23   Kathrin Mignon DASEN, MD  clonazePAM  (KLONOPIN ) 0.5 MG tablet Take 1 tablet (0.5 mg total) by mouth daily as needed for severe breakthrough panic/anxiety 12/18/23     DULoxetine  (CYMBALTA ) 60 MG capsule Take 1 capsule (60 mg total) by mouth 2 (two) times daily. 10/16/23     hydrocortisone  (ANUSOL -HC) 25 MG suppository Place 1 suppository (25 mg total) rectally at bedtime. 02/13/24   Juvenal Harlene PENNER, DO  hydrocortisone  cream 1 % Apply topically 2 (two) times daily. 02/13/24   Vann, Jessica U, DO  levothyroxine  (SYNTHROID ) 50 MCG tablet Take 50 mcg by mouth daily before breakfast.    [provider]  metoCLOPramide  (REGLAN ) 5 MG tablet Take 1 tablet (5 mg total) by mouth every 8 (eight)  hours as needed for nausea or vomiting. 10/11/23   Rai, Nydia POUR, MD  multivitamin (RENA-VIT) TABS tablet Take 1 tablet by mouth at bedtime. 08/01/20   Tobie Burgess Opoka, MD  ondansetron  (ZOFRAN -ODT) 8 MG disintegrating tablet Dissilve 1 tablet (8 mg total) by mouth every 8 (eight) hours as needed for nausea and vomiting. 11/29/23     oxyCODONE  (ROXICODONE ) 15 MG immediate release tablet Take 1 tablet (15 mg total) by mouth every 6 (six) hours as needed. 11/29/23     pantoprazole  (PROTONIX ) 40 MG tablet Take 1 tablet by mouth twice daily 09/21/22   Kerman Vina HERO, NP  polycarbophil (FIBERCON) 625 MG tablet Take 1 tablet (625 mg total) by mouth daily. 02/13/24   Vann, Jessica U, DO  polyethylene glycol powder (GLYCOLAX /MIRALAX ) 17 GM/SCOOP powder Take 17 g by mouth daily. Dissolve 1 capful (17g) in 4-8 ounces of liquid and take by mouth daily. 02/14/24   Juvenal Harlene PENNER, DO  prazosin  (MINIPRESS ) 5 MG capsule Take 1 capsule (5  mg total) by mouth at bedtime. 10/16/23     pregabalin  (LYRICA ) 50 MG capsule Take 50 mg by mouth 2 (two) times daily. 07/10/23   [provider]  PROLIA 60 MG/ML SOSY injection Inject 60 mg into the skin every 6 (six) months. 09/30/23   [provider]  promethazine  (PHENERGAN ) 25 MG tablet Take 1 tablet (25 mg total) by mouth 3 (three) times daily as needed. 12/11/23     tirzepatide  (MOUNJARO ) 7.5 MG/0.5ML Pen Inject 7.5 mg into the skin once a week. 10/22/23     tiZANidine  (ZANAFLEX ) 4 MG tablet Take 1 tablet (4 mg total) by mouth every 6 (six) hours as needed. 10/22/23     trazodone  (DESYREL ) 300 MG tablet Take 2 tablets (600 mg total) by mouth at bedtime. 10/16/23     Vitamin D , Ergocalciferol , (DRISDOL ) 1.25 MG (50000 UNIT) CAPS capsule Take 1 capsule (50,000 Units total) by mouth every 7 (seven) days. 11/01/23   Kathrin Mignon DASEN, MD                                                                                                                                    Past  Surgical History Past Surgical History:  Procedure Laterality Date   ABLATION ON ENDOMETRIOSIS  2008   BIOPSY  12/27/2018   Procedure: BIOPSY;  Surgeon: Eda Iha, MD;  Location: [facility] ENDOSCOPY;  Service: Gastroenterology;;   BIOPSY  10/05/2021   Procedure: BIOPSY;  Surgeon: Avram Lupita BRAVO, MD;  Location: [facility] ENDOSCOPY;  Service: Gastroenterology;;   COLONOSCOPY N/A 02/13/2024   Procedure: COLONOSCOPY;  Surgeon: Wilhelmenia Aloha Raddle., MD;  Location: [facility];  Service: Gastroenterology;  Laterality: N/A;   ESOPHAGOGASTRODUODENOSCOPY (EGD) WITH PROPOFOL  N/A 12/27/2018   Procedure: ESOPHAGOGASTRODUODENOSCOPY (EGD) WITH PROPOFOL ;  Surgeon: Eda Iha, MD;  Location: [facility] ENDOSCOPY;  Service: Gastroenterology;  Laterality: N/A;   ESOPHAGOGASTRODUODENOSCOPY (EGD) WITH PROPOFOL  N/A 10/05/2021   Procedure: ESOPHAGOGASTRODUODENOSCOPY (EGD) WITH PROPOFOL ;  Surgeon: Avram Lupita BRAVO, MD;  Location: [facility] ENDOSCOPY;  Service: Gastroenterology;  Laterality: N/A;   HEMORRHOID SURGERY  ~ 2002   IR FLUORO GUIDE CV LINE RIGHT  06/14/2020   IR REMOVAL TUN CV CATH W/O FL  06/23/2020   IR US  GUIDE VASC ACCESS RIGHT  06/14/2020   LAMINECTOMY WITH POSTERIOR LATERAL ARTHRODESIS LEVEL 4 N/A 11/06/2023   Procedure: LAMINECTOMY WITH POSTERIOR LATERAL ARTHRODESIS THORACIC TEN- THORACIC ELEVEN, THORACIC ELEVEN- THORACIC TWELVE, THORACIC TWELVE- LUMBAR ONE- LUMBAR TWO, LAMINECTOMY THORACIC TWELVE;  Surgeon: Joshua Alm Hamilton, MD;  Location: [facility] OR;  Service: Neurosurgery;  Laterality: N/A;  Posterior lateral fusion - T10-T11 - T11-T12 - T12-L1 -  - L1-L2, Laminectomy T12   ORIF ANKLE FRACTURE Right 03/28/2020   Procedure: OPEN REDUCTION INTERNAL FIXATION (ORIF) RIGHT BIMALLEOLAR ANKLE FRACTURE;  Surgeon: Josefina Chew, MD;  Location: [facility];  Service: Orthopedics;  Laterality: Right;   Family History Family History  Problem Relation Age  of Onset   Breast cancer Mother    Diabetes Mother     Breast cancer Maternal Grandmother    Breast cancer Paternal Grandmother    Colon polyps Paternal Grandmother    Colon cancer Neg Hx    Esophageal cancer Neg Hx    Rectal cancer Neg Hx    Stomach cancer Neg Hx     Social History Social History   Tobacco Use   Smoking status: Never   Smokeless tobacco: Never  Vaping Use   Vaping status: Never Used  Substance Use Topics   Alcohol  use: Not Currently    Comment: 10/15/2014 I've drank before; nothing regular; don't drink now cause of RX I'm on   Drug use: No   Allergies Patient has no known allergies.  Review of Systems Review of Systems  All other systems reviewed and are negative.   Physical Exam Vital Signs  I have reviewed the triage vital signs BP (!) 188/113   Pulse 86   Temp 97.6 F (36.4 C) (Oral)   Resp 16   LMP 06/03/2018 (Approximate) Comment: neg hcg 05/10/20  SpO2 97%  Physical Exam Vitals and nursing note reviewed.  Constitutional:      General: She is not in acute distress.    Appearance: She is well-developed.  HENT:     Head: Normocephalic and atraumatic.     Mouth/Throat:     Mouth: Mucous membranes are moist.  Eyes:     Pupils: Pupils are equal, round, and reactive to light.  Cardiovascular:     Rate and Rhythm: Normal rate and regular rhythm.     Heart sounds: No murmur heard. Pulmonary:     Effort: Pulmonary effort is normal. No respiratory distress.     Breath sounds: Normal breath sounds.  Abdominal:     General: Abdomen is flat.     Palpations: Abdomen is soft.     Tenderness: There is abdominal tenderness (RLQ).  Musculoskeletal:        General: No tenderness.     Right lower leg: No edema.     Left lower leg: No edema.  Skin:    General: Skin is warm and dry.  Neurological:     General: No focal deficit present.     Mental Status: She is alert. Mental status is at baseline.  Psychiatric:        Mood and Affect: Mood normal.        Behavior: Behavior normal.     ED  Results and Treatments Labs (all labs ordered are listed, but only abnormal results are displayed) Labs Reviewed  COMPREHENSIVE METABOLIC PANEL WITH GFR - Abnormal; Notable for the following components:      Result Value   Glucose, Bld 200 (*)    Total Protein 8.8 (*)    Anion gap 16 (*)    All other components within normal limits  CBC - Abnormal; Notable for the following components:   WBC 15.2 (*)    MCH 25.0 (*)    RDW 16.7 (*)    Platelets 480 (*)    All other components within normal limits  LIPASE, BLOOD  Radiology CT ABDOMEN PELVIS W CONTRAST Result Date: 02/20/2024 CLINICAL DATA:  Abdominal pain. EXAM: CT ABDOMEN AND PELVIS WITH CONTRAST TECHNIQUE: Multidetector CT imaging of the abdomen and pelvis was performed using the standard protocol following bolus administration of intravenous contrast. RADIATION DOSE REDUCTION: This exam was performed according to the departmental dose-optimization program which includes automated exposure control, adjustment of the mA and/or kV according to patient size and/or use of iterative reconstruction technique. CONTRAST:  OMNIPAQUE  IOHEXOL  300 MG/ML  SOLN COMPARISON:  CT abdomen pelvis dated 02/11/2024. FINDINGS: Evaluation is limited due to streak artifact caused by spinal hardware. Lower chest: The visualized lung bases are clear. No intra-abdominal free air or free fluid. Hepatobiliary: The liver is unremarkable. No acute or dilatation. The gallbladder is unremarkable. Pancreas: Unremarkable. No pancreatic ductal dilatation or surrounding inflammatory changes. Spleen: Normal in size without focal abnormality. Adrenals/Urinary Tract: The adrenal glands unremarkable. The kidneys, visualized ureters, and urinary bladder appear unremarkable. Stomach/Bowel: There is no bowel obstruction or active inflammation. The appendix is  normal. The cecum is located in the mid abdomen suggestive of a mobile cecum. Vascular/Lymphatic: Mild aortoiliac atherosclerotic disease. The IVC is unremarkable. Gas. There is no adenopathy. Reproductive: The uterus is grossly unremarkable. No suspicious adnexal masses. Other: None Musculoskeletal: Degenerative changes of the spine and posterior spinal fusion hardware. Multilevel compression fractures most severe at T12 with retropulsion as seen on the prior CT and MRIs. No acute osseous pathology. IMPRESSION: 1. No acute intra-abdominal or pelvic pathology. 2.  Aortic Atherosclerosis (ICD10-I70.0). Electronically Signed   By: Vanetta Chou M.D.   On: 02/20/2024 18:57    Pertinent labs & imaging results that were available during my care of the patient were reviewed by me and considered in my medical decision making (see MDM for details).  Medications Ordered in ED Medications  HYDROmorphone  (DILAUDID ) injection 0.5 mg (has no administration in time range)  HYDROmorphone  (DILAUDID ) injection 0.5 mg (0.5 mg Intravenous Given 02/20/24 1813)  lactated ringers  bolus 1,000 mL (1,000 mLs Intravenous New Bag/Given 02/20/24 1813)  ondansetron  (ZOFRAN ) injection 4 mg (4 mg Intravenous Given 02/20/24 1813)  iohexol  (OMNIPAQUE ) 300 MG/ML solution 100 mL (100 mLs Intravenous Contrast Given 02/20/24 1834)                                                                                                                                     Procedures Procedures  (including critical care time)  Medical Decision Making / ED Course   MDM:  53 year old presenting to the emergency department with abdominal pain.  Patient overall well-appearing, does have right lower quadrant abdominal pain on palpation.  Differential includes chronic pain, cyclical vomiting, patient did have recent colonoscopy so differential includes, abscess, recently diagnosed with proctitis so also includes complication such as  obstruction.  Will obtain further testing including labs and CT scan.  Will give medication for pain.  Reassess.  Clinical Course as  of 02/20/24 1941  Thu Feb 20, 2024  1939 Workup is overall reassuring.  WBC count is slightly elevated although her on review seems somewhat chronically elevated.  CT scan is without evidence of acute process.  She is feeling better, request additional dose of pain control.  Feel at this point patient is stable for discharge to home.  Will give additional pain control prior to discharge.  Recommended close outpatient primary care physician for further care. [WS]    Clinical Course User Index [WS] Francesca Elsie CROME, MD     Additional history obtained:  -External records from outside source obtained and reviewed including: Chart review including previous notes, labs, imaging, consultation notes including prior er visit for similar    Lab Tests: -I ordered, reviewed, and interpreted labs.   The pertinent results include:   Labs Reviewed  COMPREHENSIVE METABOLIC PANEL WITH GFR - Abnormal; Notable for the following components:      Result Value   Glucose, Bld 200 (*)    Total Protein 8.8 (*)    Anion gap 16 (*)    All other components within normal limits  CBC - Abnormal; Notable for the following components:   WBC 15.2 (*)    MCH 25.0 (*)    RDW 16.7 (*)    Platelets 480 (*)    All other components within normal limits  LIPASE, BLOOD    Notable for chronic leukocytosis     Imaging Studies ordered: I ordered imaging studies including CT abdomen On my interpretation imaging demonstrates no acute process I independently visualized and interpreted imaging. I agree with the radiologist interpretation   Medicines ordered and prescription drug management: Meds ordered this encounter  Medications   HYDROmorphone  (DILAUDID ) injection 0.5 mg   lactated ringers  bolus 1,000 mL   ondansetron  (ZOFRAN ) injection 4 mg   iohexol  (OMNIPAQUE ) 300 MG/ML  solution 100 mL   HYDROmorphone  (DILAUDID ) injection 0.5 mg    -I have reviewed the patients home medicines and have made adjustments as needed   Social Determinants of Health:  Diagnosis or treatment significantly limited by social determinants of health: obesity   Reevaluation: After the interventions noted above, I reevaluated the patient and found that their symptoms have improved  Co morbidities that complicate the patient evaluation  Past Medical History:  Diagnosis Date   Anxiety    Arthritis    joints ache all over (10/15/2014)   Barrett's esophagus    Bulging lumbar disc    Cardiac arrest (HCC)    Chronic lower back pain    Coronary artery disease    DDD (degenerative disc disease), cervical    Depression    DM (diabetes mellitus) (HCC)    Drug-seeking behavior    GERD (gastroesophageal reflux disease)    Headache    weekly (10/15/2014)   Hyperlipemia    Hypertension    Hypothyroidism    PTSD (post-traumatic stress disorder)    Skin cancer    had them cut off my arms; don't know what kind      Dispostion: Disposition decision including need for hospitalization was considered, and patient discharged from emergency department.    Final Clinical Impression(s) / ED Diagnoses Final diagnoses:  Abdominal pain, unspecified abdominal location     This chart was dictated using voice recognition software.  Despite best efforts to proofread,  errors can occur which can change the documentation meaning.    Francesca Elsie CROME, MD 02/20/24 [phone]

## 2024-02-24 ENCOUNTER — Encounter: Payer: Self-pay | Admitting: Radiology

## 2024-02-25 ENCOUNTER — Other Ambulatory Visit (HOSPITAL_COMMUNITY): Payer: Self-pay

## 2024-02-28 ENCOUNTER — Other Ambulatory Visit (HOSPITAL_COMMUNITY): Payer: Self-pay

## 2024-02-28 ENCOUNTER — Other Ambulatory Visit: Payer: Self-pay

## 2024-02-28 ENCOUNTER — Encounter: Payer: Self-pay | Admitting: Internal Medicine

## 2024-02-28 MED ORDER — PREGABALIN 50 MG PO CAPS
50.0000 mg | ORAL_CAPSULE | Freq: Two times a day (BID) | ORAL | 1 refills | Status: DC
  Filled 2024-02-28 – 2024-02-29 (×2): qty 60, 30d supply, fill #0
  Filled 2024-03-26: qty 60, 30d supply, fill #1

## 2024-02-28 MED ORDER — DULOXETINE HCL 60 MG PO CPEP
60.0000 mg | ORAL_CAPSULE | Freq: Two times a day (BID) | ORAL | 1 refills | Status: DC
  Filled 2024-02-28: qty 180, 90d supply, fill #0

## 2024-02-28 MED ORDER — OXYCODONE HCL 15 MG PO TABS
15.0000 mg | ORAL_TABLET | Freq: Four times a day (QID) | ORAL | 0 refills | Status: DC | PRN
  Filled 2024-02-28: qty 120, 30d supply, fill #0

## 2024-02-28 MED ORDER — TIZANIDINE HCL 4 MG PO TABS
4.0000 mg | ORAL_TABLET | Freq: Four times a day (QID) | ORAL | 5 refills | Status: DC | PRN
  Filled 2024-02-28 – 2024-02-29 (×5): qty 120, 30d supply, fill #0
  Filled 2024-03-20: qty 120, 30d supply, fill #1
  Filled 2024-03-20: qty 119, 29d supply, fill #1
  Filled 2024-03-20: qty 1, 1d supply, fill #1
  Filled 2024-04-18: qty 120, 30d supply, fill #2
  Filled ????-??-??: fill #2

## 2024-02-28 MED ORDER — NALOXONE HCL 4 MG/0.1ML NA LIQD
1.0000 | NASAL | 3 refills | Status: AC | PRN
  Filled 2024-02-28: qty 2, 1d supply, fill #0

## 2024-02-29 ENCOUNTER — Other Ambulatory Visit (HOSPITAL_COMMUNITY): Payer: Self-pay

## 2024-02-29 ENCOUNTER — Encounter: Payer: Self-pay | Admitting: Internal Medicine

## 2024-03-02 ENCOUNTER — Other Ambulatory Visit (HOSPITAL_COMMUNITY): Payer: Self-pay

## 2024-03-09 ENCOUNTER — Other Ambulatory Visit (HOSPITAL_COMMUNITY): Payer: Self-pay

## 2024-03-09 ENCOUNTER — Encounter: Payer: Self-pay | Admitting: Internal Medicine

## 2024-03-10 ENCOUNTER — Other Ambulatory Visit: Payer: Self-pay

## 2024-03-14 ENCOUNTER — Other Ambulatory Visit (HOSPITAL_COMMUNITY): Payer: Self-pay

## 2024-03-16 ENCOUNTER — Other Ambulatory Visit (HOSPITAL_COMMUNITY): Payer: Self-pay

## 2024-03-16 ENCOUNTER — Other Ambulatory Visit: Payer: Self-pay

## 2024-03-16 MED ORDER — MOUNJARO 10 MG/0.5ML ~~LOC~~ SOAJ
SUBCUTANEOUS | 1 refills | Status: DC
  Filled 2024-03-16 – 2024-03-17 (×2): qty 2, 28d supply, fill #0
  Filled 2024-04-07: qty 2, 28d supply, fill #1

## 2024-03-17 ENCOUNTER — Other Ambulatory Visit (HOSPITAL_COMMUNITY): Payer: Self-pay

## 2024-03-18 ENCOUNTER — Other Ambulatory Visit (HOSPITAL_COMMUNITY): Payer: Self-pay

## 2024-03-18 MED ORDER — ARIPIPRAZOLE 10 MG PO TABS
10.0000 mg | ORAL_TABLET | Freq: Every day | ORAL | 1 refills | Status: DC
  Filled 2024-03-18 – 2024-04-10 (×3): qty 90, 90d supply, fill #0

## 2024-03-18 MED ORDER — DULOXETINE HCL 60 MG PO CPEP
60.0000 mg | ORAL_CAPSULE | Freq: Two times a day (BID) | ORAL | 1 refills | Status: DC
  Filled 2024-03-18: qty 180, 90d supply, fill #0

## 2024-03-18 MED ORDER — CLONAZEPAM 0.5 MG PO TABS
0.5000 mg | ORAL_TABLET | Freq: Every day | ORAL | 2 refills | Status: DC | PRN
  Filled 2024-03-18: qty 30, 30d supply, fill #0

## 2024-03-18 MED ORDER — PRAZOSIN HCL 5 MG PO CAPS
5.0000 mg | ORAL_CAPSULE | Freq: Every day | ORAL | 2 refills | Status: DC
  Filled 2024-03-18: qty 90, 90d supply, fill #0

## 2024-03-18 MED ORDER — TRAZODONE HCL 300 MG PO TABS
600.0000 mg | ORAL_TABLET | Freq: Every day | ORAL | 0 refills | Status: DC
  Filled 2024-03-18: qty 180, 90d supply, fill #0

## 2024-03-20 ENCOUNTER — Encounter: Payer: Self-pay | Admitting: Internal Medicine

## 2024-03-20 ENCOUNTER — Other Ambulatory Visit (HOSPITAL_COMMUNITY): Payer: Self-pay

## 2024-03-25 ENCOUNTER — Other Ambulatory Visit (HOSPITAL_COMMUNITY): Payer: Self-pay

## 2024-03-25 MED ORDER — PREGABALIN 50 MG PO CAPS
50.0000 mg | ORAL_CAPSULE | Freq: Two times a day (BID) | ORAL | 5 refills | Status: AC
  Filled 2024-03-25 – 2024-03-28 (×3): qty 60, 30d supply, fill #0
  Filled 2024-04-26: qty 60, 30d supply, fill #1

## 2024-03-26 ENCOUNTER — Other Ambulatory Visit (HOSPITAL_COMMUNITY): Payer: Self-pay

## 2024-03-26 ENCOUNTER — Other Ambulatory Visit: Payer: Self-pay

## 2024-03-26 MED ORDER — OXYCODONE HCL 15 MG PO TABS
15.0000 mg | ORAL_TABLET | Freq: Four times a day (QID) | ORAL | 0 refills | Status: DC
  Filled 2024-03-26: qty 120, 30d supply, fill #0

## 2024-03-27 ENCOUNTER — Other Ambulatory Visit (HOSPITAL_COMMUNITY): Payer: Self-pay

## 2024-03-28 ENCOUNTER — Other Ambulatory Visit (HOSPITAL_COMMUNITY): Payer: Self-pay

## 2024-04-01 ENCOUNTER — Other Ambulatory Visit (HOSPITAL_COMMUNITY): Payer: Self-pay

## 2024-04-07 ENCOUNTER — Encounter: Payer: Self-pay | Admitting: Cardiovascular Disease

## 2024-04-07 ENCOUNTER — Ambulatory Visit: Attending: Cardiovascular Disease | Admitting: Cardiovascular Disease

## 2024-04-07 ENCOUNTER — Other Ambulatory Visit (HOSPITAL_COMMUNITY): Payer: Self-pay

## 2024-04-07 VITALS — BP 88/54 | HR 80 | Ht 64.5 in | Wt 199.0 lb

## 2024-04-07 DIAGNOSIS — E782 Mixed hyperlipidemia: Secondary | ICD-10-CM

## 2024-04-07 DIAGNOSIS — I38 Endocarditis, valve unspecified: Secondary | ICD-10-CM

## 2024-04-07 DIAGNOSIS — I10 Essential (primary) hypertension: Secondary | ICD-10-CM

## 2024-04-07 DIAGNOSIS — E785 Hyperlipidemia, unspecified: Secondary | ICD-10-CM | POA: Insufficient documentation

## 2024-04-07 NOTE — Progress Notes (Signed)
 04/07/2024 [name]   [birth year]  [id number]  Primary Physician [name], PA-C Primary Cardiologist: Dorn JINNY Lesches MD [place] [place], [place]  HPI:  [name] is a 53 y.o. mildly overweight married Caucasian female mother of 2 children who currently is out of work.  She is accompanied by her husband Sheena today.  I last saw her in the office/19/23.  Her risk factors include treated hypertension and hyperlipidemia.  She did have witnessed cardiac arrest 07/06/2020 with ROSC in 3 minutes.  Her echo at that time was normal without regional wall motion abnormalities.  Her most recent 2D echo performed 10/31/2023 revealed normal LV systolic function, mild to moderate MR and mild AI.  Her RV systolic pressure was normal.  She has had back issues and had back surgery by Dr. Alm Molt in July and she still has quite a bit of pain.  She walks with a rollator at home.  She denies chest pain or shortness of breath.   Active Medications[1]   Allergies[2]  Social History   Socioeconomic History   Marital status: Married    Spouse name: Not on file   Number of children: Not on file   Years of education: Not on file   Highest education level: Not on file  Occupational History   Occupation: unemployed  Tobacco Use   Smoking status: Never   Smokeless tobacco: Never  Vaping Use   Vaping status: Never Used  Substance and Sexual Activity   Alcohol  use: Not Currently    Comment: 10/15/2014 I've drank before; nothing regular; don't drink now cause of RX I'm on   Drug use: No   Sexual activity: Not Currently  Other Topics Concern   Not on file  Social History Narrative   [place] with husband. Never smoked; quit alcohol  10 years; used to work for [employer] NO IV drug abuse.    Social Drivers of Health   Tobacco Use: Low Risk (02/13/2024)   Patient History    Smoking Tobacco Use: Never    Smokeless Tobacco Use: Never     Passive Exposure: Not on file  Financial Resource Strain: Not on file  Food Insecurity: No Food Insecurity (02/12/2024)   Epic    Worried About Programme Researcher, Broadcasting/film/video in the Last Year: Never true    Ran Out of Food in the Last Year: Never true  Transportation Needs: No Transportation Needs (02/12/2024)   Epic    Lack of Transportation (Medical): No    Lack of Transportation (Non-Medical): No  Physical Activity: Not on file  Stress: Not on file  Social Connections: Not on file  Intimate Partner Violence: Not At Risk (02/12/2024)   Epic    Fear of Current or Ex-Partner: No    Emotionally Abused: No    Physically Abused: No    Sexually Abused: No  Depression (PHQ2-9): Not on file  Alcohol  Screen: Not on file  Housing: Low Risk (02/12/2024)   Epic    Unable to Pay for Housing in the Last Year: No    Number of Times Moved in the Last Year: 0    Homeless in the Last Year: No  Utilities: Not At Risk (02/12/2024)   Epic    Threatened with loss of utilities: No  Health Literacy: Not on file     Review of Systems: General: negative for chills, fever, night sweats or weight changes.  Cardiovascular: negative for chest  pain, dyspnea on exertion, edema, orthopnea, palpitations, paroxysmal nocturnal dyspnea or shortness of breath Dermatological: negative for rash Respiratory: negative for cough or wheezing Urologic: negative for hematuria Abdominal: negative for nausea, vomiting, diarrhea, bright red blood per rectum, melena, or hematemesis Neurologic: negative for visual changes, syncope, or dizziness All other systems reviewed and are otherwise negative except as noted above.    Blood pressure (!) 88/54, pulse 80, height 5' 4.5 (1.638 m), weight 199 lb (90.3 kg), last menstrual period 06/03/2018, SpO2 92%.  General appearance: alert and no distress Neck: no adenopathy, no carotid bruit, no JVD, supple, symmetrical, trachea midline, and thyroid  not enlarged, symmetric, no  tenderness/mass/nodules Lungs: clear to auscultation bilaterally Heart: regular rate and rhythm, S1, S2 normal, no murmur, click, rub or gallop Extremities: extremities normal, atraumatic, no cyanosis or edema Pulses: 2+ and symmetric Skin: Skin color, texture, turgor normal. No rashes or lesions Neurologic: Grossly normal  EKG not performed today      ASSESSMENT AND PLAN:   Valvular heart disease Valvular heart disease with recent 2D echo performed 10/31/2023 revealing normal LV systolic function, mild to moderate MR and mild AI.  Essential hypertension History essential hypertension blood pressure measured today at 88/54.  She is on carvedilol .  She had just taken her blood pressure medicine and muscle relaxant prep prior to coming to the office.  Hyperlipidemia History of hyperlipidemia on statin therapy.  She is scheduled to have a fasting lipid profile today.     Dorn DOROTHA Lesches MD FACP,FACC,FAHA, FSCAI 04/07/2024 1:42 PM    [1]  Current Meds  Medication Sig   acetaminophen  (TYLENOL ) 650 MG CR tablet Take 1,300 mg by mouth every 8 (eight) hours as needed for pain.   ARIPiprazole  (ABILIFY ) 10 MG tablet Take 1 tablet (10 mg total) by mouth daily.   ARIPiprazole  (ABILIFY ) 10 MG tablet Take 1 tablet (10 mg total) by mouth daily.   atorvastatin  (LIPITOR) 20 MG tablet Take 1 tablet (20 mg total) by mouth daily.   B Complex-C-Folic Acid  (RENAL VITAMIN) 0.8 MG TABS Take 1 tablet by mouth daily.   carvedilol  (COREG ) 6.25 MG tablet Take 1 tablet (6.25 mg total) by mouth 2 (two) times daily with a meal.   clonazePAM  (KLONOPIN ) 0.5 MG tablet Take 1 tablet (0.5 mg total) by mouth daily as needed for severe breakthrough panic/anxiety   clonazePAM  (KLONOPIN ) 0.5 MG tablet Take 1 tablet (0.5 mg total) by mouth daily as needed.   DULoxetine  (CYMBALTA ) 60 MG capsule Take 1 capsule (60 mg total) by mouth 2 (two) times daily.   DULoxetine  (CYMBALTA ) 60 MG capsule Take 1 capsule (60 mg  total) by mouth 2 (two) times daily.   DULoxetine  (CYMBALTA ) 60 MG capsule Take 1 capsule (60 mg total) by mouth 2 (two) times daily.   hydrocortisone  (ANUSOL -HC) 25 MG suppository Place 1 suppository (25 mg total) rectally at bedtime.   hydrocortisone  cream 1 % Apply topically 2 (two) times daily.   levothyroxine  (SYNTHROID ) 50 MCG tablet Take 50 mcg by mouth daily before breakfast.   metoCLOPramide  (REGLAN ) 5 MG tablet Take 1 tablet (5 mg total) by mouth every 8 (eight) hours as needed for nausea or vomiting.   multivitamin (RENA-VIT) TABS tablet Take 1 tablet by mouth at bedtime.   naloxone  (NARCAN ) nasal spray 4 mg/0.1 mL Place 1 spray into the nose as needed.   ondansetron  (ZOFRAN -ODT) 8 MG disintegrating tablet Dissilve 1 tablet (8 mg total) by mouth every 8 (eight) hours as needed  for nausea and vomiting.   oxyCODONE  (ROXICODONE ) 15 MG immediate release tablet Take 1 tablet (15 mg total) by mouth 4 (four) times daily as needed.   pantoprazole  (PROTONIX ) 40 MG tablet Take 1 tablet by mouth twice daily   polycarbophil (FIBERCON) 625 MG tablet Take 1 tablet (625 mg total) by mouth daily.   polyethylene glycol powder (GLYCOLAX /MIRALAX ) 17 GM/SCOOP powder Take 17 g by mouth daily. Dissolve 1 capful (17g) in 4-8 ounces of liquid and take by mouth daily.   prazosin  (MINIPRESS ) 5 MG capsule Take 1 capsule (5 mg total) by mouth at bedtime.   pregabalin  (LYRICA ) 50 MG capsule Take 50 mg by mouth 2 (two) times daily.   pregabalin  (LYRICA ) 50 MG capsule Take 1 capsule (50 mg total) by mouth 2 (two) times daily.   pregabalin  (LYRICA ) 50 MG capsule Take 1 capsule (50 mg total) by mouth 2 (two) times daily.   PROLIA 60 MG/ML SOSY injection Inject 60 mg into the skin every 6 (six) months.   promethazine  (PHENERGAN ) 25 MG tablet Take 1 tablet (25 mg total) by mouth 3 (three) times daily as needed.   tirzepatide  (MOUNJARO ) 10 MG/0.5ML Pen Inject 0.5 mL under skin weekly   tirzepatide  (MOUNJARO ) 7.5 MG/0.5ML  Pen Inject 7.5 mg into the skin once a week.   tiZANidine  (ZANAFLEX ) 4 MG tablet Take 1 tablet (4 mg total) by mouth every 6 (six) hours as needed.   trazodone  (DESYREL ) 300 MG tablet Take 2 tablets (600 mg total) by mouth at bedtime.   trazodone  (DESYREL ) 300 MG tablet Take 2 tablets (600 mg total) by mouth at bedtime.   Vitamin D , Ergocalciferol , (DRISDOL ) 1.25 MG (50000 UNIT) CAPS capsule Take 1 capsule (50,000 Units total) by mouth every 7 (seven) days.   [DISCONTINUED] oxyCODONE  (ROXICODONE ) 15 MG immediate release tablet Take 1 tablet (15 mg total) by mouth every 6 (six) hours as needed.   [DISCONTINUED] oxyCODONE  (ROXICODONE ) 15 MG immediate release tablet Take 1 tablet (15 mg total) by mouth 4 (four) times daily.   [DISCONTINUED] tiZANidine  (ZANAFLEX ) 4 MG tablet Take 1 tablet (4 mg total) by mouth every 6 (six) hours as needed.  [2] No Known Allergies

## 2024-04-07 NOTE — Assessment & Plan Note (Signed)
 Valvular heart disease with recent 2D echo performed 10/31/2023 revealing normal LV systolic function, mild to moderate MR and mild AI.

## 2024-04-07 NOTE — Patient Instructions (Signed)
 Medication Instructions:  Your physician recommends that you continue on your current medications as directed. Please refer to the Current Medication list given to you today.  *If you need a refill on your cardiac medications before your next appointment, please call your pharmacy*   Follow-Up: At [facility], you and your health needs are our priority.  As part of our continuing mission to provide you with exceptional heart care, our providers are all part of one team.  This team includes your primary Cardiologist (physician) and Advanced Practice Providers or APPs (Physician Assistants and Nurse Practitioners) who all work together to provide you with the care you need, when you need it.  Your next appointment:   6 month(s)  Provider:   Glendia Ferrier, PA-C         Then, Dorn Lesches, MD will plan to see you again in 12 month(s).

## 2024-04-07 NOTE — Assessment & Plan Note (Signed)
 History of hyperlipidemia on statin therapy.  She is scheduled to have a fasting lipid profile today.

## 2024-04-07 NOTE — Assessment & Plan Note (Signed)
 History essential hypertension blood pressure measured today at 88/54.  She is on carvedilol .  She had just taken her blood pressure medicine and muscle relaxant prep prior to coming to the office.

## 2024-04-10 ENCOUNTER — Other Ambulatory Visit (HOSPITAL_COMMUNITY): Payer: Self-pay

## 2024-04-10 ENCOUNTER — Other Ambulatory Visit: Payer: Self-pay

## 2024-04-10 ENCOUNTER — Encounter: Payer: Self-pay | Admitting: Internal Medicine

## 2024-04-10 MED ORDER — DULOXETINE HCL 60 MG PO CPEP
60.0000 mg | ORAL_CAPSULE | Freq: Two times a day (BID) | ORAL | 1 refills | Status: AC
  Filled 2024-04-18 – 2024-04-27 (×4): qty 180, 90d supply, fill #0

## 2024-04-10 MED ORDER — TRAZODONE HCL 300 MG PO TABS
600.0000 mg | ORAL_TABLET | Freq: Every day | ORAL | 1 refills | Status: AC
  Filled 2024-04-10: qty 180, 90d supply, fill #0

## 2024-04-10 MED ORDER — PRAZOSIN HCL 5 MG PO CAPS
10.0000 mg | ORAL_CAPSULE | Freq: Every day | ORAL | 2 refills | Status: AC
  Filled 2024-04-10: qty 180, 90d supply, fill #0

## 2024-04-10 MED ORDER — QUETIAPINE FUMARATE 25 MG PO TABS
25.0000 mg | ORAL_TABLET | Freq: Two times a day (BID) | ORAL | 1 refills | Status: DC
  Filled 2024-04-10: qty 60, 30d supply, fill #0

## 2024-04-10 MED ORDER — MOUNJARO 12.5 MG/0.5ML ~~LOC~~ SOAJ
12.5000 mg | SUBCUTANEOUS | 1 refills | Status: DC
  Filled 2024-04-10: qty 2, 28d supply, fill #0

## 2024-04-10 MED ORDER — CLONAZEPAM 0.5 MG PO TABS
0.5000 mg | ORAL_TABLET | Freq: Every day | ORAL | 2 refills | Status: AC | PRN
  Filled 2024-04-18: qty 30, 30d supply, fill #0

## 2024-04-10 MED ORDER — ARIPIPRAZOLE 10 MG PO TABS
10.0000 mg | ORAL_TABLET | Freq: Two times a day (BID) | ORAL | 1 refills | Status: AC
  Filled 2024-04-10: qty 180, 90d supply, fill #0

## 2024-04-10 MED ORDER — TOPIRAMATE 50 MG PO TABS
ORAL_TABLET | ORAL | 0 refills | Status: DC
  Filled 2024-04-10: qty 60, 37d supply, fill #0

## 2024-04-14 ENCOUNTER — Inpatient Hospital Stay (HOSPITAL_COMMUNITY)
Admission: EM | Admit: 2024-04-14 | Discharge: 2024-04-20 | DRG: 092 | Disposition: A | Discharge status: Home / Self Care | Attending: Internal Medicine | Admitting: Internal Medicine

## 2024-04-14 ENCOUNTER — Encounter (HOSPITAL_COMMUNITY): Payer: Self-pay | Admitting: Emergency Medicine

## 2024-04-14 ENCOUNTER — Emergency Department (HOSPITAL_COMMUNITY)

## 2024-04-14 ENCOUNTER — Other Ambulatory Visit: Payer: Self-pay

## 2024-04-14 DIAGNOSIS — G894 Chronic pain syndrome: Secondary | ICD-10-CM | POA: Diagnosis present

## 2024-04-14 DIAGNOSIS — I38 Endocarditis, valve unspecified: Secondary | ICD-10-CM | POA: Diagnosis present

## 2024-04-14 DIAGNOSIS — E611 Iron deficiency: Secondary | ICD-10-CM | POA: Diagnosis present

## 2024-04-14 DIAGNOSIS — Z8674 Personal history of sudden cardiac arrest: Secondary | ICD-10-CM

## 2024-04-14 DIAGNOSIS — R112 Nausea with vomiting, unspecified: Secondary | ICD-10-CM | POA: Diagnosis present

## 2024-04-14 DIAGNOSIS — R339 Retention of urine, unspecified: Secondary | ICD-10-CM | POA: Diagnosis present

## 2024-04-14 DIAGNOSIS — E785 Hyperlipidemia, unspecified: Secondary | ICD-10-CM | POA: Diagnosis present

## 2024-04-14 DIAGNOSIS — Z6832 Body mass index (BMI) 32.0-32.9, adult: Secondary | ICD-10-CM

## 2024-04-14 DIAGNOSIS — F419 Anxiety disorder, unspecified: Secondary | ICD-10-CM | POA: Diagnosis present

## 2024-04-14 DIAGNOSIS — Z79899 Other long term (current) drug therapy: Secondary | ICD-10-CM

## 2024-04-14 DIAGNOSIS — E119 Type 2 diabetes mellitus without complications: Secondary | ICD-10-CM | POA: Diagnosis present

## 2024-04-14 DIAGNOSIS — E039 Hypothyroidism, unspecified: Secondary | ICD-10-CM | POA: Diagnosis present

## 2024-04-14 DIAGNOSIS — Z8659 Personal history of other mental and behavioral disorders: Secondary | ICD-10-CM

## 2024-04-14 DIAGNOSIS — F4310 Post-traumatic stress disorder, unspecified: Secondary | ICD-10-CM | POA: Diagnosis present

## 2024-04-14 DIAGNOSIS — F319 Bipolar disorder, unspecified: Secondary | ICD-10-CM | POA: Diagnosis present

## 2024-04-14 DIAGNOSIS — R7881 Bacteremia: Secondary | ICD-10-CM | POA: Diagnosis present

## 2024-04-14 DIAGNOSIS — I10 Essential (primary) hypertension: Secondary | ICD-10-CM | POA: Diagnosis present

## 2024-04-14 DIAGNOSIS — B9683 Acinetobacter baumannii as the cause of diseases classified elsewhere: Secondary | ICD-10-CM | POA: Diagnosis present

## 2024-04-14 DIAGNOSIS — Z981 Arthrodesis status: Secondary | ICD-10-CM

## 2024-04-14 DIAGNOSIS — Z85828 Personal history of other malignant neoplasm of skin: Secondary | ICD-10-CM

## 2024-04-14 DIAGNOSIS — M545 Low back pain, unspecified: Secondary | ICD-10-CM | POA: Diagnosis present

## 2024-04-14 DIAGNOSIS — Z833 Family history of diabetes mellitus: Secondary | ICD-10-CM

## 2024-04-14 DIAGNOSIS — G928 Other toxic encephalopathy: Principal | ICD-10-CM | POA: Diagnosis present

## 2024-04-14 DIAGNOSIS — I2510 Atherosclerotic heart disease of native coronary artery without angina pectoris: Secondary | ICD-10-CM | POA: Diagnosis present

## 2024-04-14 DIAGNOSIS — Z79890 Hormone replacement therapy (postmenopausal): Secondary | ICD-10-CM

## 2024-04-14 DIAGNOSIS — Z7985 Long-term (current) use of injectable non-insulin antidiabetic drugs: Secondary | ICD-10-CM

## 2024-04-14 DIAGNOSIS — Z83719 Family history of colon polyps, unspecified: Secondary | ICD-10-CM

## 2024-04-14 DIAGNOSIS — M5030 Other cervical disc degeneration, unspecified cervical region: Secondary | ICD-10-CM | POA: Diagnosis present

## 2024-04-14 DIAGNOSIS — Z1152 Encounter for screening for COVID-19: Secondary | ICD-10-CM

## 2024-04-14 DIAGNOSIS — R4182 Altered mental status, unspecified: Principal | ICD-10-CM

## 2024-04-14 DIAGNOSIS — E876 Hypokalemia: Secondary | ICD-10-CM | POA: Diagnosis present

## 2024-04-14 DIAGNOSIS — E66811 Obesity, class 1: Secondary | ICD-10-CM | POA: Diagnosis present

## 2024-04-14 DIAGNOSIS — G9341 Metabolic encephalopathy: Secondary | ICD-10-CM | POA: Diagnosis present

## 2024-04-14 DIAGNOSIS — Z803 Family history of malignant neoplasm of breast: Secondary | ICD-10-CM

## 2024-04-14 LAB — CBC
HCT: 42.6 % (ref 36.0–46.0)
Hemoglobin: 13.2 g/dL (ref 12.0–15.0)
MCH: 25.1 pg — ABNORMAL LOW (ref 26.0–34.0)
MCHC: 31 g/dL (ref 30.0–36.0)
MCV: 81 fL (ref 80.0–100.0)
Platelets: 427 K/uL — ABNORMAL HIGH (ref 150–400)
RBC: 5.26 MIL/uL — ABNORMAL HIGH (ref 3.87–5.11)
RDW: 15.9 % — ABNORMAL HIGH (ref 11.5–15.5)
WBC: 20.5 K/uL — ABNORMAL HIGH (ref 4.0–10.5)
nRBC: 0 % (ref 0.0–0.2)

## 2024-04-14 LAB — COMPREHENSIVE METABOLIC PANEL WITH GFR
ALT: 8 U/L (ref 0–44)
AST: 17 U/L (ref 15–41)
Albumin: 4.2 g/dL (ref 3.5–5.0)
Alkaline Phosphatase: 121 U/L (ref 38–126)
Anion gap: 16 — ABNORMAL HIGH (ref 5–15)
BUN: 9 mg/dL (ref 6–20)
CO2: 21 mmol/L — ABNORMAL LOW (ref 22–32)
Calcium: 10.3 mg/dL (ref 8.9–10.3)
Chloride: 103 mmol/L (ref 98–111)
Creatinine, Ser: 0.8 mg/dL (ref 0.44–1.00)
GFR, Estimated: >60 mL/min
Glucose, Bld: 140 mg/dL — ABNORMAL HIGH (ref 70–99)
Potassium: 3.9 mmol/L (ref 3.5–5.1)
Sodium: 139 mmol/L (ref 135–145)
Total Bilirubin: 0.5 mg/dL (ref 0.0–1.2)
Total Protein: 9.5 g/dL — ABNORMAL HIGH (ref 6.5–8.1)

## 2024-04-14 LAB — CBG MONITORING, ED: Glucose-Capillary: 136 mg/dL — ABNORMAL HIGH (ref 70–99)

## 2024-04-14 MED ORDER — SODIUM CHLORIDE 0.9 % IV BOLUS
1000.0000 mL | Freq: Once | INTRAVENOUS | Status: AC
  Administered 2024-04-15: 1000 mL via INTRAVENOUS

## 2024-04-14 MED ORDER — ONDANSETRON HCL 4 MG/2ML IJ SOLN
4.0000 mg | Freq: Once | INTRAMUSCULAR | Status: AC
  Administered 2024-04-15: 4 mg via INTRAVENOUS
  Filled 2024-04-14: qty 2

## 2024-04-14 NOTE — ED Provider Notes (Signed)
 " [place] EMERGENCY DEPARTMENT AT [facility] Provider Note   CSN: [id number] Arrival date & time: 04/14/24  2208     Patient presents with: Altered Mental Status   [name] is a 53 y.o. female.   The history is provided by medical records and a relative.  Altered Mental Status  53 y.o. F with hx of PTSD, HTN, anxiety, HLP, DM, GERD, depression, headaches, CAD, presenting to the ED with family for AMS.  Reportedly, she has been altered pretty much all day.  Family at beside reports she has been having nausea/vomiting all day, has not been able to eat/drink.  Family at bedside states they were sick last week with what doctor felt like was flu but tested negative for flu A/B as well as covid.  No noted fevers at home but temp 99.100F here in the ED.  She did recently have monjouro dose increased to 12.5mg  from 10mg , no other medications changes known.  She is not currently on anticoagulation.  Prior to Admission medications  Medication Sig Start Date End Date Taking? Authorizing Provider  acetaminophen  (TYLENOL ) 650 MG CR tablet Take 1,300 mg by mouth every 8 (eight) hours as needed for pain.    [provider]  ARIPiprazole  (ABILIFY ) 10 MG tablet Take 1 tablet (10 mg total) by mouth daily. 10/16/23     ARIPiprazole  (ABILIFY ) 10 MG tablet Take 1 tablet (10 mg total) by mouth daily. 03/18/24     ARIPiprazole  (ABILIFY ) 10 MG tablet Take 1 tablet (10 mg total) by mouth 2 (two) times daily. 04/10/24     atorvastatin  (LIPITOR) 20 MG tablet Take 1 tablet (20 mg total) by mouth daily. 09/28/23     B Complex-C-Folic Acid  (RENAL VITAMIN) 0.8 MG TABS Take 1 tablet by mouth daily. 01/24/24     carvedilol  (COREG ) 6.25 MG tablet Take 1 tablet (6.25 mg total) by mouth 2 (two) times daily with a meal. 10/26/23   Kathrin Mignon DASEN, MD  clonazePAM  (KLONOPIN ) 0.5 MG tablet Take 1 tablet (0.5 mg total) by mouth daily as needed for severe breakthrough panic/anxiety 12/18/23     clonazePAM   (KLONOPIN ) 0.5 MG tablet Take 1 tablet (0.5 mg total) by mouth daily as needed. 03/18/24     clonazePAM  (KLONOPIN ) 0.5 MG tablet Take 1 tablet (0.5 mg total) by mouth daily as needed for severe breakthrough panic/ anxiety. 04/10/24     DULoxetine  (CYMBALTA ) 60 MG capsule Take 1 capsule (60 mg total) by mouth 2 (two) times daily. 10/16/23     DULoxetine  (CYMBALTA ) 60 MG capsule Take 1 capsule (60 mg total) by mouth 2 (two) times daily. 02/28/24     DULoxetine  (CYMBALTA ) 60 MG capsule Take 1 capsule (60 mg total) by mouth 2 (two) times daily. 03/18/24     DULoxetine  (CYMBALTA ) 60 MG capsule Take 1 capsule (60 mg total) by mouth 2 (two) times daily. 04/10/24     hydrocortisone  (ANUSOL -HC) 25 MG suppository Place 1 suppository (25 mg total) rectally at bedtime. 02/13/24   Juvenal Harlene PENNER, DO  hydrocortisone  cream 1 % Apply topically 2 (two) times daily. 02/13/24   Vann, Jessica U, DO  levothyroxine  (SYNTHROID ) 50 MCG tablet Take 50 mcg by mouth daily before breakfast.    [provider]  metoCLOPramide  (REGLAN ) 5 MG tablet Take 1 tablet (5 mg total) by mouth every 8 (eight) hours as needed for nausea or vomiting. 10/11/23   Rai, Nydia POUR, MD  multivitamin (RENA-VIT) TABS tablet Take 1  tablet by mouth at bedtime. 08/01/20   Tobie Burgess [place], MD  naloxone  (NARCAN ) nasal spray 4 mg/0.1 mL Place 1 spray into the nose as needed. 02/28/24     ondansetron  (ZOFRAN -ODT) 8 MG disintegrating tablet Dissilve 1 tablet (8 mg total) by mouth every 8 (eight) hours as needed for nausea and vomiting. 11/29/23     oxyCODONE  (ROXICODONE ) 15 MG immediate release tablet Take 1 tablet (15 mg total) by mouth 4 (four) times daily as needed. 02/28/24     pantoprazole  (PROTONIX ) 40 MG tablet Take 1 tablet by mouth twice daily 09/21/22   Kerman Vina HERO, NP  polycarbophil (FIBERCON) 625 MG tablet Take 1 tablet (625 mg total) by mouth daily. 02/13/24   Vann, Jessica U, DO  polyethylene glycol powder (GLYCOLAX /MIRALAX ) 17  GM/SCOOP powder Take 17 g by mouth daily. Dissolve 1 capful (17g) in 4-8 ounces of liquid and take by mouth daily. 02/14/24   Vann, Jessica U, DO  prazosin  (MINIPRESS ) 5 MG capsule Take 1 capsule (5 mg total) by mouth at bedtime. Patient not taking: Reported on 04/07/2024 10/16/23     prazosin  (MINIPRESS ) 5 MG capsule Take 1 capsule (5 mg total) by mouth at bedtime. 03/18/24     prazosin  (MINIPRESS ) 5 MG capsule Take 2 capsules (10 mg total) by mouth at bedtime for nightmares. 04/10/24     pregabalin  (LYRICA ) 50 MG capsule Take 50 mg by mouth 2 (two) times daily. 07/10/23   [provider]  pregabalin  (LYRICA ) 50 MG capsule Take 1 capsule (50 mg total) by mouth 2 (two) times daily. 02/28/24     pregabalin  (LYRICA ) 50 MG capsule Take 1 capsule (50 mg total) by mouth 2 (two) times daily. 03/25/24     PROLIA 60 MG/ML SOSY injection Inject 60 mg into the skin every 6 (six) months. 09/30/23   [provider]  promethazine  (PHENERGAN ) 25 MG tablet Take 1 tablet (25 mg total) by mouth 3 (three) times daily as needed. 12/11/23     QUEtiapine  (SEROQUEL ) 25 MG tablet Take 1 tablet (25 mg total) by mouth 2 (two) times daily. 04/10/24     tirzepatide  (MOUNJARO ) 10 MG/0.5ML Pen Inject 0.5 mL under skin weekly 03/16/24     tirzepatide  (MOUNJARO ) 12.5 MG/0.5ML Pen Inject 12.5 mg into the skin once a week. 04/10/24     tirzepatide  (MOUNJARO ) 7.5 MG/0.5ML Pen Inject 7.5 mg into the skin once a week. 10/22/23     tiZANidine  (ZANAFLEX ) 4 MG tablet Take 1 tablet (4 mg total) by mouth every 6 (six) hours as needed. 02/28/24     topiramate  (TOPAMAX ) 50 MG tablet Take 1 tablet (50 mg total) by mouth daily for 14 days, THEN 2 tablets (100 mg total) daily. 04/10/24 05/17/24    trazodone  (DESYREL ) 300 MG tablet Take 2 tablets (600 mg total) by mouth at bedtime. 10/16/23     trazodone  (DESYREL ) 300 MG tablet Take 2 tablets (600 mg total) by mouth at bedtime. 03/18/24     trazodone  (DESYREL ) 300 MG tablet Take 2 tablets  (600 mg total) by mouth at bedtime. 04/10/24     Vitamin D , Ergocalciferol , (DRISDOL ) 1.25 MG (50000 UNIT) CAPS capsule Take 1 capsule (50,000 Units total) by mouth every 7 (seven) days. 11/01/23   Gonfa, Taye T, MD    Allergies: Patient has no known allergies.    Review of Systems  Unable to perform ROS: Mental status change    Updated Vital Signs BP (!) 155/135 (BP Location: Right Arm)  Pulse (!) 110   Temp 99.6 F (37.6 C) (Oral)   Resp 18   Ht 5' 4 (1.626 m)   Wt 86.2 kg   LMP 06/03/2018 Comment: neg hcg 05/10/20  SpO2 100%   BMI 32.61 kg/m   Physical Exam Vitals and nursing note reviewed.  Constitutional:      Appearance: She is well-developed.  HENT:     Head: Normocephalic and atraumatic.     Mouth/Throat:     Comments: Mucous membranes are dry, lips are dry and chapped Eyes:     Conjunctiva/sclera: Conjunctivae normal.     Pupils: Pupils are equal, round, and reactive to light.     Comments: PERRL  Cardiovascular:     Rate and Rhythm: Normal rate and regular rhythm.     Heart sounds: Normal heart sounds.  Pulmonary:     Effort: Pulmonary effort is normal.     Breath sounds: Normal breath sounds.  Abdominal:     General: Bowel sounds are normal.     Palpations: Abdomen is soft.     Tenderness: There is no abdominal tenderness. There is no rebound.     Comments: Soft, nontender, no elicited peritoneal signs  Musculoskeletal:        General: Normal range of motion.     Cervical back: Normal range of motion.  Skin:    General: Skin is warm and dry.  Neurological:     Mental Status: She is alert.     Comments: Awake, alert, does not answer questions when asked, she is spontaneously moving her extremities but does not follow commands when prompted     (all labs ordered are listed, but only abnormal results are displayed) Labs Reviewed  COMPREHENSIVE METABOLIC PANEL WITH GFR - Abnormal; Notable for the following components:      Result Value   CO2 21 (*)     Glucose, Bld 140 (*)    Total Protein 9.5 (*)    Anion gap 16 (*)    All other components within normal limits  CBC - Abnormal; Notable for the following components:   WBC 20.5 (*)    RBC 5.26 (*)    MCH 25.1 (*)    RDW 15.9 (*)    Platelets 427 (*)    All other components within normal limits  URINALYSIS, ROUTINE W REFLEX MICROSCOPIC - Abnormal; Notable for the following components:   Ketones, ur 20 (*)    All other components within normal limits  URINE DRUG SCREEN - Abnormal; Notable for the following components:   Amphetamines POSITIVE (*)    All other components within normal limits  SALICYLATE LEVEL - Abnormal; Notable for the following components:   Salicylate Lvl <7.0 (*)    All other components within normal limits  ACETAMINOPHEN  LEVEL - Abnormal; Notable for the following components:   Acetaminophen  (Tylenol ), Serum <10 (*)    All other components within normal limits  CBG MONITORING, ED - Abnormal; Notable for the following components:   Glucose-Capillary 136 (*)    All other components within normal limits  RESP PANEL BY RT-PCR (RSV, FLU A&B, COVID)  RVPGX2  URINE CULTURE  CULTURE, BLOOD (ROUTINE X 2)  CULTURE, BLOOD (ROUTINE X 2) W REFLEX TO ID PANEL  ETHANOL  AMMONIA  I-STAT CG4 LACTIC ACID, ED  I-STAT CG4 LACTIC ACID, ED    EKG: None  Radiology: [facility] Chest Port 1 View Result Date: 04/14/2024 CLINICAL DATA:  Altered mental status. EXAM: PORTABLE CHEST 1 VIEW  COMPARISON:  11/21/2023 radiograph and CT FINDINGS: Chronic volume loss in the right hemithorax with elevated hemidiaphragm. Stable heart size and mediastinal contours. No acute airspace disease, pleural effusion, or pneumothorax. No acute osseous findings. IMPRESSION: No acute chest findings. Chronic volume loss in the right hemithorax with elevated hemidiaphragm. Electronically Signed   By: Andrea Gasman M.D.   On: 04/14/2024 23:08     Procedures   CRITICAL CARE Performed by: Olam CHRISTELLA [place]   Total critical care time: 45 minutes  Critical care time was exclusive of separately billable procedures and treating other patients.  Critical care was necessary to treat or prevent imminent or life-threatening deterioration.  Critical care was time spent personally by me on the following activities: development of treatment plan with patient and/or surrogate as well as nursing, discussions with consultants, evaluation of patient's response to treatment, examination of patient, obtaining history from patient or surrogate, ordering and performing treatments and interventions, ordering and review of laboratory studies, ordering and review of radiographic studies, pulse oximetry and re-evaluation of patient's condition.   Medications Ordered in the ED  sodium chloride  0.9 % bolus 1,000 mL (1,000 mLs Intravenous Bolus from Bag 04/15/24 0200)  ondansetron  (ZOFRAN ) injection 4 mg (4 mg Intravenous Given 04/15/24 0200)                                    Medical Decision Making Amount and/or Complexity of Data Reviewed Labs: ordered. Radiology: ordered and independent interpretation performed. ECG/medicine tests: ordered and independent interpretation performed.  Risk Prescription drug management. Decision regarding hospitalization.   53 y.o. female here with family for AMS.  Has had nausea/vomiting today, family was sick with similar last week but negative for covid/flu.  Has been altered and not acting like herself today.  Usually independent and able to care for herself.  No falls or head trauma reported.  She is not on anticoagulation.  She has a temp of 99.6 here but is nontoxic.  She does appear somewhat clinically dry.  Abdomen is soft and nontender.  Screening labs were obtained from triage and reviewed--does have leukocytosis, no significant electrolyte derangement.  RVP is negative.  CXR without acute findings.  Added on additional work-up including toxicology labs  (UDS, ethanol, APAP, salicylate), ammonia, etc, most are reassuring.  UDS is + for amphetamines.  UA with ketones but no signs of infection.    3:11 AM Patient remains quite altered here.  She is able to follow simple commands be redirected at this time but still appears far from her baseline.  She has not had any active emesis while here in the emergency department.  Workup is fairly unrevealing as etiology of her change in mental status.  She is on multiple sedating medications (Abilify , Klonopin , Cymbalta , oxycodone , Lyrica , Phenergan , Seroquel , Zanaflex , trazodone ) so polypharmacy could be a possibility.  Can also consider viral syndrome as family reported they were sick last week with similar.  Will plan to discuss with our hospitalist for admission.  Spoke with Dr. Sundil-- will try to admit, if she is not able to get to her then AM team will admit.  Final diagnoses:  Altered mental status, unspecified altered mental status type    ED Discharge Orders     None          [place] Olam CHRISTELLA, PA-C 04/15/24 0505  "

## 2024-04-14 NOTE — ED Notes (Signed)
 RN Notified about the elevated vitals.

## 2024-04-14 NOTE — ED Triage Notes (Signed)
 Pt arrives w/ family w/ c/o AMS since this morning. Also c/o HTN, n/v. Pt not speaking in triage.

## 2024-04-15 ENCOUNTER — Observation Stay (HOSPITAL_COMMUNITY)

## 2024-04-15 DIAGNOSIS — G9341 Metabolic encephalopathy: Secondary | ICD-10-CM

## 2024-04-15 LAB — COMPREHENSIVE METABOLIC PANEL WITH GFR
ALT: 8 U/L (ref 0–44)
AST: 14 U/L — ABNORMAL LOW (ref 15–41)
Albumin: 3.8 g/dL (ref 3.5–5.0)
Alkaline Phosphatase: 102 U/L (ref 38–126)
Anion gap: 14 (ref 5–15)
BUN: 9 mg/dL (ref 6–20)
CO2: 23 mmol/L (ref 22–32)
Calcium: 9.6 mg/dL (ref 8.9–10.3)
Chloride: 104 mmol/L (ref 98–111)
Creatinine, Ser: 0.77 mg/dL (ref 0.44–1.00)
GFR, Estimated: >60 mL/min
Glucose, Bld: 126 mg/dL — ABNORMAL HIGH (ref 70–99)
Potassium: 3.2 mmol/L — ABNORMAL LOW (ref 3.5–5.1)
Sodium: 141 mmol/L (ref 135–145)
Total Bilirubin: 0.5 mg/dL (ref 0.0–1.2)
Total Protein: 8.1 g/dL (ref 6.5–8.1)

## 2024-04-15 LAB — RESP PANEL BY RT-PCR (RSV, FLU A&B, COVID)  RVPGX2
Influenza A by PCR: NEGATIVE
Influenza B by PCR: NEGATIVE
Resp Syncytial Virus by PCR: NEGATIVE
SARS Coronavirus 2 by RT PCR: NEGATIVE

## 2024-04-15 LAB — CBC
HCT: 35.4 % — ABNORMAL LOW (ref 36.0–46.0)
Hemoglobin: 10.9 g/dL — ABNORMAL LOW (ref 12.0–15.0)
MCH: 25.3 pg — ABNORMAL LOW (ref 26.0–34.0)
MCHC: 30.8 g/dL (ref 30.0–36.0)
MCV: 82.1 fL (ref 80.0–100.0)
Platelets: 341 K/uL (ref 150–400)
RBC: 4.31 MIL/uL (ref 3.87–5.11)
RDW: 15.9 % — ABNORMAL HIGH (ref 11.5–15.5)
WBC: 17.2 K/uL — ABNORMAL HIGH (ref 4.0–10.5)
nRBC: 0 % (ref 0.0–0.2)

## 2024-04-15 LAB — PHOSPHORUS
Phosphorus: 2.3 mg/dL — ABNORMAL LOW (ref 2.5–4.6)
Phosphorus: 1.9 mg/dL — ABNORMAL LOW (ref 2.5–4.6)

## 2024-04-15 LAB — URINE DRUG SCREEN
Amphetamines: POSITIVE — AB
Barbiturates: NEGATIVE
Benzodiazepines: NEGATIVE
Cocaine: NEGATIVE
Fentanyl: NEGATIVE
Methadone Scn, Ur: NEGATIVE
Opiates: NEGATIVE
Tetrahydrocannabinol: NEGATIVE

## 2024-04-15 LAB — URINALYSIS, ROUTINE W REFLEX MICROSCOPIC
Bilirubin Urine: NEGATIVE
Glucose, UA: NEGATIVE mg/dL
Hgb urine dipstick: NEGATIVE
Ketones, ur: 20 mg/dL — AB
Leukocytes,Ua: NEGATIVE
Nitrite: NEGATIVE
Protein, ur: NEGATIVE mg/dL
Specific Gravity, Urine: 1.016 (ref 1.005–1.030)
pH: 5 (ref 5.0–8.0)

## 2024-04-15 LAB — MAGNESIUM: Magnesium: 2.2 mg/dL (ref 1.7–2.4)

## 2024-04-15 LAB — AMMONIA: Ammonia: 22 umol/L (ref 9–35)

## 2024-04-15 LAB — SALICYLATE LEVEL: Salicylate Lvl: <7 mg/dL — ABNORMAL LOW (ref 7.0–30.0)

## 2024-04-15 LAB — I-STAT CG4 LACTIC ACID, ED: Lactic Acid, Venous: 1.5 mmol/L (ref 0.5–1.9)

## 2024-04-15 LAB — ACETAMINOPHEN LEVEL: Acetaminophen (Tylenol), Serum: <10 ug/mL — ABNORMAL LOW (ref 10–30)

## 2024-04-15 LAB — ETHANOL: Alcohol, Ethyl (B): <15 mg/dL

## 2024-04-15 MED ORDER — ACETAMINOPHEN 650 MG RE SUPP: 650.0000 mg | Freq: Four times a day (QID) | RECTAL | Status: DC | PRN

## 2024-04-15 MED ORDER — SODIUM CHLORIDE 0.45 % IV SOLN: INTRAVENOUS | Status: DC

## 2024-04-15 MED ORDER — SODIUM CHLORIDE 0.9 % IV SOLN
3.0000 g | Freq: Four times a day (QID) | INTRAVENOUS | Status: DC
  Administered 2024-04-15 – 2024-04-20 (×19): 3 g via INTRAVENOUS
  Filled 2024-04-15 (×21): qty 8

## 2024-04-15 MED ORDER — K PHOS MONO-SOD PHOS DI & MONO 155-852-130 MG PO TABS
500.0000 mg | ORAL_TABLET | Freq: Four times a day (QID) | ORAL | Status: AC
  Administered 2024-04-15: 500 mg via ORAL
  Filled 2024-04-15 (×3): qty 2

## 2024-04-15 MED ORDER — HYDRALAZINE HCL 20 MG/ML IJ SOLN
10.0000 mg | Freq: Once | INTRAMUSCULAR | Status: AC
  Administered 2024-04-15: 10 mg via INTRAVENOUS
  Filled 2024-04-15: qty 1

## 2024-04-15 MED ORDER — ONDANSETRON HCL 4 MG PO TABS: 4.0000 mg | ORAL_TABLET | Freq: Four times a day (QID) | ORAL | Status: DC | PRN

## 2024-04-15 MED ORDER — ONDANSETRON HCL 4 MG/2ML IJ SOLN
4.0000 mg | Freq: Once | INTRAMUSCULAR | Status: AC
  Administered 2024-04-15: 4 mg via INTRAVENOUS
  Filled 2024-04-15: qty 2

## 2024-04-15 MED ORDER — HYDRALAZINE HCL 20 MG/ML IJ SOLN
20.0000 mg | INTRAMUSCULAR | Status: DC | PRN
  Administered 2024-04-16 (×3): 20 mg via INTRAVENOUS
  Filled 2024-04-15 (×3): qty 1

## 2024-04-15 MED ORDER — LABETALOL HCL 5 MG/ML IV SOLN
10.0000 mg | INTRAVENOUS | Status: DC | PRN
  Administered 2024-04-15: 10 mg via INTRAVENOUS
  Filled 2024-04-15: qty 4

## 2024-04-15 MED ORDER — POTASSIUM CHLORIDE 10 MEQ/100ML IV SOLN
10.0000 meq | INTRAVENOUS | Status: AC
  Administered 2024-04-15 (×4): 10 meq via INTRAVENOUS
  Filled 2024-04-15 (×4): qty 100

## 2024-04-15 MED ORDER — HYDRALAZINE HCL 20 MG/ML IJ SOLN
10.0000 mg | INTRAMUSCULAR | Status: DC | PRN
  Administered 2024-04-15: 10 mg via INTRAVENOUS
  Filled 2024-04-15: qty 1

## 2024-04-15 MED ORDER — ACETAMINOPHEN 325 MG PO TABS
650.0000 mg | ORAL_TABLET | Freq: Four times a day (QID) | ORAL | Status: DC | PRN
  Administered 2024-04-16 – 2024-04-19 (×3): 650 mg via ORAL
  Filled 2024-04-15 (×5): qty 2

## 2024-04-15 MED ORDER — K PHOS MONO-SOD PHOS DI & MONO 155-852-130 MG PO TABS: 500.0000 mg | ORAL_TABLET | Freq: Four times a day (QID) | ORAL | Status: DC

## 2024-04-15 MED ORDER — ONDANSETRON HCL 4 MG/2ML IJ SOLN
4.0000 mg | Freq: Four times a day (QID) | INTRAMUSCULAR | Status: DC | PRN
  Administered 2024-04-16 – 2024-04-17 (×4): 4 mg via INTRAVENOUS
  Filled 2024-04-15 (×4): qty 2

## 2024-04-15 NOTE — Progress Notes (Signed)
 PHARMACY - PHYSICIAN COMMUNICATION CRITICAL VALUE ALERT - BLOOD CULTURE IDENTIFICATION (BCID)  [name] is an 53 y.o. female who presented to [facility] on 04/14/2024 with a chief complaint of acute metabolic encephalopathy  Assessment:  1/3 acinetobacter  Name of physician (or Provider) Contacted: JINNY Kipper  Current antibiotics: none  Changes to prescribed antibiotics recommended:  Start unasyn  3gm IV q6h  Results for orders placed or performed during the hospital encounter of 04/14/24  Blood Culture ID Panel (Reflexed) (Collected: 04/15/2024  1:58 AM)  Result Value Ref Range   Enterococcus faecalis NOT DETECTED NOT DETECTED   Enterococcus Faecium NOT DETECTED NOT DETECTED   Listeria monocytogenes NOT DETECTED NOT DETECTED   Staphylococcus species NOT DETECTED NOT DETECTED   Staphylococcus aureus (BCID) NOT DETECTED NOT DETECTED   Staphylococcus epidermidis NOT DETECTED NOT DETECTED   Staphylococcus lugdunensis NOT DETECTED NOT DETECTED   Streptococcus species NOT DETECTED NOT DETECTED   Streptococcus agalactiae NOT DETECTED NOT DETECTED   Streptococcus pneumoniae NOT DETECTED NOT DETECTED   Streptococcus pyogenes NOT DETECTED NOT DETECTED   A.calcoaceticus-baumannii DETECTED (A) NOT DETECTED   Bacteroides fragilis NOT DETECTED NOT DETECTED   Enterobacterales NOT DETECTED NOT DETECTED   Enterobacter cloacae complex NOT DETECTED NOT DETECTED   Escherichia coli NOT DETECTED NOT DETECTED   Klebsiella aerogenes NOT DETECTED NOT DETECTED   Klebsiella oxytoca NOT DETECTED NOT DETECTED   Klebsiella pneumoniae NOT DETECTED NOT DETECTED   Proteus species NOT DETECTED NOT DETECTED   Salmonella species NOT DETECTED NOT DETECTED   Serratia marcescens NOT DETECTED NOT DETECTED   Haemophilus influenzae NOT DETECTED NOT DETECTED   Neisseria meningitidis NOT DETECTED NOT DETECTED   Pseudomonas aeruginosa NOT DETECTED NOT DETECTED   Stenotrophomonas maltophilia NOT DETECTED NOT  DETECTED   Candida albicans NOT DETECTED NOT DETECTED   Candida auris NOT DETECTED NOT DETECTED   Candida glabrata NOT DETECTED NOT DETECTED   Candida krusei NOT DETECTED NOT DETECTED   Candida parapsilosis NOT DETECTED NOT DETECTED   Candida tropicalis NOT DETECTED NOT DETECTED   Cryptococcus neoformans/gattii NOT DETECTED NOT DETECTED   CTX-M ESBL NOT DETECTED NOT DETECTED   Carbapenem resistance IMP NOT DETECTED NOT DETECTED   Carbapenem resistance KPC NOT DETECTED NOT DETECTED   Carbapenem resistance NDM NOT DETECTED NOT DETECTED   Carbapenem resistance VIM NOT DETECTED NOT DETECTED    Leeroy Mace RPh 04/15/2024, 9:19 PM

## 2024-04-15 NOTE — Plan of Care (Signed)
 Just admitted with AMS

## 2024-04-15 NOTE — Hospital Course (Signed)
" °  53-yo female past medical history of PTSD, anxiety, depression, DM type II, GERD, chronic headache, CAD and essential hypertension presented emergency department accompanied by family for altered mental status in the morning.  Family reported patient was altered pretty much all day.  Family at the bedside reported patient having nausea and vomiting throughout the day with poor oral intake.  Patient has been sick for last 1 week with concern for flu but negative flu test and COVID outside.  Family reported patient recently started on Mounjaro  increased dose 10 to 12.5 mg and no other medication change.  At presentation to ED patient is borderline hypertensive otherwise hemodynamically stable.  Afebrile.  Lab work showing leukocytosis 20.5 stable H&H and elevated platelet count 427.  CMP unremarkable except low bicarb 21 elevated anion gap 16.  Normal blood glucose level 136.  Respiratory panel negative.  Normal blood alcohol  level.  Normal acetaminophen  and Tylenol  level.  Normal ammonia level.  Normal lactic acid level.  Blood cultures are in process.  UA unremarkable except ketone +20.  UDS positive with amphetamine.  Blood cultures obtained in the ED.  Chest x-ray no acute finding. Chronic volume loss in the right hemithorax with elevated hemidiaphragm. In the ED Patient received 1 L of NS bolus and Zofran .  In the ED patient is alert however not completely oriented.  Hospitalist consulted for further evaluation management of acute metabolic encephalopathy in the setting of polypharmacy (multiple antipsychotic medications and nausea/vomiting in the setting of Mounjaro .               "

## 2024-04-15 NOTE — H&P (Signed)
 " History and Physical    Patient: [name] FMW:[id number] DOB: 09/29/1970 DOA: 04/14/2024 DOS: the patient was seen and examined on 04/15/2024 PCP: [name], PA-C  Patient coming from: Home  Chief Complaint:  Chief Complaint  Patient presents with   Altered Mental Status   HPI: [name] is a 53 y.o. female with medical history significant of anxiety, osteoarthritis, anxiety, depression, GERD, Barret's esophagus, cervical DDD, chronic lower back pain, bulging lumbar disks, CAD, history of cardiac arrest.  Type 2 diabetes, drug-seeking behavior, headaches, hyperlipidemia, hypertension, hypothyroidism, skin cancer who was brought to the emergency department at 2024 with altered mental status since the morning.  Unable to elaborate, but able to answer simple questions.  Her speech is slurred.  No headache, chest, back or abdominal pain at this moment.  She is still only oriented to name despite more than 13 hours since arrival.  Lab work: Urinalysis showed ketones of 20 mg/dL, was otherwise unremarkable.  UDS was positive for amphetamines.  Coronavirus, influenza and RSV PCR test was negative.  CBC showed a white count 20.5, hemoglobin 13.2 g/dL and platelets 572.  CMP showed a CO2 of 21 mmol/L with an anion gap of 16, glucose 149 mg/dL and total protein 9.5 g/dL, the rest of the CMP measurements were normal.  Unremarkable lactic acid, ammonia, acetaminophen , salicylate and alcohol  level.  Imaging: Portable 1 view chest radiograph with no acute chest findings.  There is chronic volume loss in the right hemithorax with elevated hemidiaphragm.  CT head without contrast no acute intracranial abnormality.   ED course: Initial vital signs were temperature 99.6 F, pulse 120 respiration 18, BP 155/135 mmHg and O2 sat 100% on room air.  The patient received ondansetron  4 mg IVP x 2 and 1000 mL of normal saline bolus.  Review of Systems: As mentioned in the history of present  illness. All other systems reviewed and are negative. Past Medical History:  Diagnosis Date   Anxiety    Arthritis    joints ache all over (10/15/2014)   Barrett's esophagus    Bulging lumbar disc    Cardiac arrest (HCC)    Chronic lower back pain    Coronary artery disease    DDD (degenerative disc disease), cervical    Depression    DM (diabetes mellitus) (HCC)    Drug-seeking behavior    GERD (gastroesophageal reflux disease)    Headache    weekly (10/15/2014)   Hyperlipemia    Hypertension    Hypothyroidism    PTSD (post-traumatic stress disorder)    Skin cancer    had them cut off my arms; don't know what kind   Past Surgical History:  Procedure Laterality Date   ABLATION ON ENDOMETRIOSIS  2008   BIOPSY  12/27/2018   Procedure: BIOPSY;  Surgeon: Eda Iha, MD;  Location: [facility];  Service: Gastroenterology;;   BIOPSY  10/05/2021   Procedure: BIOPSY;  Surgeon: Avram Lupita BRAVO, MD;  Location: [facility];  Service: Gastroenterology;;   COLONOSCOPY N/A 02/13/2024   Procedure: COLONOSCOPY;  Surgeon: Wilhelmenia Aloha Raddle., MD;  Location: [facility];  Service: Gastroenterology;  Laterality: N/A;   ESOPHAGOGASTRODUODENOSCOPY (EGD) WITH PROPOFOL  N/A 12/27/2018   Procedure: ESOPHAGOGASTRODUODENOSCOPY (EGD) WITH PROPOFOL ;  Surgeon: Eda Iha, MD;  Location: [facility];  Service: Gastroenterology;  Laterality: N/A;   ESOPHAGOGASTRODUODENOSCOPY (EGD) WITH PROPOFOL  N/A 10/05/2021   Procedure: ESOPHAGOGASTRODUODENOSCOPY (EGD) WITH PROPOFOL ;  Surgeon: Avram Lupita BRAVO, MD;  Location: [facility] ENDOSCOPY;  Service: Gastroenterology;  Laterality: N/A;   HEMORRHOID SURGERY  ~ 2002   IR FLUORO GUIDE CV LINE RIGHT  06/14/2020   IR REMOVAL TUN CV CATH W/O FL  06/23/2020   IR US  GUIDE VASC ACCESS RIGHT  06/14/2020   LAMINECTOMY WITH POSTERIOR LATERAL ARTHRODESIS LEVEL 4 N/A 11/06/2023   Procedure: LAMINECTOMY WITH POSTERIOR LATERAL ARTHRODESIS THORACIC TEN- THORACIC ELEVEN,  THORACIC ELEVEN- THORACIC TWELVE, THORACIC TWELVE- LUMBAR ONE- LUMBAR TWO, LAMINECTOMY THORACIC TWELVE;  Surgeon: Joshua Alm Hamilton, MD;  Location[facility];  Service: Neurosurgery;  Laterality: N/A;  Posterior lateral fusion - T10-T11 - T11-T12 - T12-L1 -  - L1-L2, Laminectomy T12   ORIF ANKLE FRACTURE Right 03/28/2020   Procedure: OPEN REDUCTION INTERNAL FIXATION (ORIF) RIGHT BIMALLEOLAR ANKLE FRACTURE;  Surgeon: Josefina Chew, MD;  Location: [facility];  Service: Orthopedics;  Laterality: Right;   Social History:  reports that she has never smoked. She has never used smokeless tobacco. She reports that she does not currently use alcohol . She reports that she does not use drugs.  Allergies[1]  Family History  Problem Relation Age of Onset   Breast cancer Mother    Diabetes Mother    Breast cancer Maternal Grandmother    Breast cancer Paternal Grandmother    Colon polyps Paternal Grandmother    Colon cancer Neg Hx    Esophageal cancer Neg Hx    Rectal cancer Neg Hx    Stomach cancer Neg Hx     Prior to Admission medications  Medication Sig Start Date End Date Taking? Authorizing Provider  acetaminophen  (TYLENOL ) 650 MG CR tablet Take 1,300 mg by mouth every 8 (eight) hours as needed for pain.    [provider]  ARIPiprazole  (ABILIFY ) 10 MG tablet Take 1 tablet (10 mg total) by mouth daily. 10/16/23     ARIPiprazole  (ABILIFY ) 10 MG tablet Take 1 tablet (10 mg total) by mouth daily. 03/18/24     ARIPiprazole  (ABILIFY ) 10 MG tablet Take 1 tablet (10 mg total) by mouth 2 (two) times daily. 04/10/24     atorvastatin  (LIPITOR) 20 MG tablet Take 1 tablet (20 mg total) by mouth daily. 09/28/23     B Complex-C-Folic Acid  (RENAL VITAMIN) 0.8 MG TABS Take 1 tablet by mouth daily. 01/24/24     carvedilol  (COREG ) 6.25 MG tablet Take 1 tablet (6.25 mg total) by mouth 2 (two) times daily with a meal. 10/26/23   Kathrin Mignon DASEN, MD  clonazePAM  (KLONOPIN ) 0.5 MG tablet Take 1 tablet (0.5  mg total) by mouth daily as needed for severe breakthrough panic/anxiety 12/18/23     clonazePAM  (KLONOPIN ) 0.5 MG tablet Take 1 tablet (0.5 mg total) by mouth daily as needed. 03/18/24     clonazePAM  (KLONOPIN ) 0.5 MG tablet Take 1 tablet (0.5 mg total) by mouth daily as needed for severe breakthrough panic/ anxiety. 04/10/24     DULoxetine  (CYMBALTA ) 60 MG capsule Take 1 capsule (60 mg total) by mouth 2 (two) times daily. 10/16/23     DULoxetine  (CYMBALTA ) 60 MG capsule Take 1 capsule (60 mg total) by mouth 2 (two) times daily. 02/28/24     DULoxetine  (CYMBALTA ) 60 MG capsule Take 1 capsule (60 mg total) by mouth 2 (two) times daily. 03/18/24     DULoxetine  (CYMBALTA ) 60 MG capsule Take 1 capsule (60 mg total) by mouth 2 (two) times daily. 04/10/24     hydrocortisone  (ANUSOL -HC) 25 MG suppository Place 1 suppository (25 mg total) rectally at bedtime. 02/13/24   Vann, Jessica  U, DO  hydrocortisone  cream 1 % Apply topically 2 (two) times daily. 02/13/24   Vann, Jessica U, DO  levothyroxine  (SYNTHROID ) 50 MCG tablet Take 50 mcg by mouth daily before breakfast.    [provider]  metoCLOPramide  (REGLAN ) 5 MG tablet Take 1 tablet (5 mg total) by mouth every 8 (eight) hours as needed for nausea or vomiting. 10/11/23   Rai, Nydia POUR, MD  multivitamin (RENA-VIT) TABS tablet Take 1 tablet by mouth at bedtime. 08/01/20   Tobie Burgess Op[place], MD  naloxone  (NARCAN ) nasal spray 4 mg/0.1 mL Place 1 spray into the nose as needed. 02/28/24     ondansetron  (ZOFRAN -ODT) 8 MG disintegrating tablet Dissilve 1 tablet (8 mg total) by mouth every 8 (eight) hours as needed for nausea and vomiting. 11/29/23     oxyCODONE  (ROXICODONE ) 15 MG immediate release tablet Take 1 tablet (15 mg total) by mouth 4 (four) times daily as needed. 02/28/24     pantoprazole  (PROTONIX ) 40 MG tablet Take 1 tablet by mouth twice daily 09/21/22   Kerman Vina HERO, NP  polycarbophil (FIBERCON) 625 MG tablet Take 1 tablet (625 mg total) by  mouth daily. 02/13/24   Vann, Jessica U, DO  polyethylene glycol powder (GLYCOLAX /MIRALAX ) 17 GM/SCOOP powder Take 17 g by mouth daily. Dissolve 1 capful (17g) in 4-8 ounces of liquid and take by mouth daily. 02/14/24   Vann, Jessica U, DO  prazosin  (MINIPRESS ) 5 MG capsule Take 1 capsule (5 mg total) by mouth at bedtime. Patient not taking: Reported on 04/07/2024 10/16/23     prazosin  (MINIPRESS ) 5 MG capsule Take 1 capsule (5 mg total) by mouth at bedtime. 03/18/24     prazosin  (MINIPRESS ) 5 MG capsule Take 2 capsules (10 mg total) by mouth at bedtime for nightmares. 04/10/24     pregabalin  (LYRICA ) 50 MG capsule Take 50 mg by mouth 2 (two) times daily. 07/10/23   [provider]  pregabalin  (LYRICA ) 50 MG capsule Take 1 capsule (50 mg total) by mouth 2 (two) times daily. 02/28/24     pregabalin  (LYRICA ) 50 MG capsule Take 1 capsule (50 mg total) by mouth 2 (two) times daily. 03/25/24     PROLIA 60 MG/ML SOSY injection Inject 60 mg into the skin every 6 (six) months. 09/30/23   [provider]  promethazine  (PHENERGAN ) 25 MG tablet Take 1 tablet (25 mg total) by mouth 3 (three) times daily as needed. 12/11/23     QUEtiapine  (SEROQUEL ) 25 MG tablet Take 1 tablet (25 mg total) by mouth 2 (two) times daily. 04/10/24     tirzepatide  (MOUNJARO ) 10 MG/0.5ML Pen Inject 0.5 mL under skin weekly 03/16/24     tirzepatide  (MOUNJARO ) 12.5 MG/0.5ML Pen Inject 12.5 mg into the skin once a week. 04/10/24     tirzepatide  (MOUNJARO ) 7.5 MG/0.5ML Pen Inject 7.5 mg into the skin once a week. 10/22/23     tiZANidine  (ZANAFLEX ) 4 MG tablet Take 1 tablet (4 mg total) by mouth every 6 (six) hours as needed. 02/28/24     topiramate  (TOPAMAX ) 50 MG tablet Take 1 tablet (50 mg total) by mouth daily for 14 days, THEN 2 tablets (100 mg total) daily. 04/10/24 05/17/24    trazodone  (DESYREL ) 300 MG tablet Take 2 tablets (600 mg total) by mouth at bedtime. 10/16/23     trazodone  (DESYREL ) 300 MG tablet Take 2 tablets  (600 mg total) by mouth at bedtime. 03/18/24     trazodone  (DESYREL ) 300 MG tablet Take 2  tablets (600 mg total) by mouth at bedtime. 04/10/24     Vitamin D , Ergocalciferol , (DRISDOL ) 1.25 MG (50000 UNIT) CAPS capsule Take 1 capsule (50,000 Units total) by mouth every 7 (seven) days. 11/01/23   Kathrin Mignon DASEN, MD    Physical Exam: Vitals:   04/14/24 2221 04/15/24 0146 04/15/24 0530  BP: (!) 155/135 (!) 153/96 (!) 164/113  Pulse: (!) 110 88 75  Resp: 18 16 17   Temp: 99.6 F (37.6 C) 99.7 F (37.6 C) 99.6 F (37.6 C)  TempSrc: Oral Oral   SpO2: 100% 98% 100%  Weight: 86.2 kg    Height: 5' 4 (1.626 m)     Physical Exam Vitals and nursing note reviewed.  Constitutional:      General: She is awake. She is not in acute distress.    Appearance: Normal appearance. She is ill-appearing.  HENT:     Head: Normocephalic.     Nose: No rhinorrhea.     Mouth/Throat:     Mouth: Mucous membranes are dry.  Eyes:     General: No scleral icterus.    Pupils: Pupils are equal, round, and reactive to light.  Neck:     Vascular: No JVD.  Cardiovascular:     Rate and Rhythm: Normal rate and regular rhythm.     Heart sounds: S1 normal and S2 normal.  Pulmonary:     Effort: Pulmonary effort is normal.     Breath sounds: Normal breath sounds. No wheezing, rhonchi or rales.  Abdominal:     General: Bowel sounds are normal. There is no distension.     Palpations: Abdomen is soft.     Tenderness: There is no abdominal tenderness.  Musculoskeletal:     Cervical back: Neck supple.     Right lower leg: No edema.     Left lower leg: No edema.  Skin:    General: Skin is warm and dry.  Neurological:     General: No focal deficit present.     Mental Status: She is alert. She is disoriented.  Psychiatric:        Mood and Affect: Mood normal.        Behavior: Behavior normal. Behavior is cooperative.     Data Reviewed:  Results are pending, will review when available.  10/31/2023 transthoracic  echocardiogram report. IMPRESSIONS:   1. Left ventricular ejection fraction, by estimation, is 60 to 65%. The  left ventricle has normal function. The left ventricle has no regional  wall motion abnormalities. Left ventricular diastolic parameters were  normal.   2. Right ventricular systolic function is normal. The right ventricular  size is normal.   3. The mitral valve is normal in structure. Mild to moderate mitral valve  regurgitation. No evidence of mitral stenosis.   4. The aortic valve is normal in structure. Aortic valve regurgitation is  mild. No aortic stenosis is present.   5. The inferior vena cava is normal in size with greater than 50%  respiratory variability, suggesting right atrial pressure of 3 mmHg.   EKG: Vent. rate 101 BPM  PR interval 121 ms  QRS duration 97 ms  QT/QTcB 368/477 ms  P-R-T axes -10 5 37  Sinus tachycardia  RSR' in V1 or V2, right VCD or RVH  Borderline T abnormalities, anterior leads  Assessment and Plan: Principal Problem:   Acute metabolic encephalopathy Observation/PCU. Frequent neurochecks. Continue IV fluids. Advance diet as tolerated. Hold sedating meds for now.  Active Problems:   Bipolar  affective disorder (HCC)   History of depression   PTSD (post-traumatic stress disorder) Holding psychotropic/sedating meds for now.    Chronic pain syndrome Hold oxycodone , pregabalin  and muscle relaxants.    Type 2 diabetes mellitus without complication,    without long-term current use of insulin  (HCC)  Carbohydrate modified diet. CBG monitoring with RI SS. Check hemoglobin A1c.    Essential hypertension IV hydralazine  as needed. Resume carvedilol  and prazosin  once more alert.    Hypokalemia Replacing. Follow-up potassium level.    Hypothyroidism Will resume levothyroxine  once cleared for oral intake.    Iron  deficiency Iron  supplementation. Monitor H&H.    Valvular heart disease Does not seem to be decompensated.     Hyperlipidemia On atorvastatin . Will restart once mental status improves.    Advance Care Planning:   Code Status: Full Code   Consults:   Family Communication:   Severity of Illness: The appropriate patient status for this patient is OBSERVATION. Observation status is judged to be reasonable and necessary in order to provide the required intensity of service to ensure the patient's safety. The patient's presenting symptoms, physical exam findings, and initial radiographic and laboratory data in the context of their medical condition is felt to place them at decreased risk for further clinical deterioration. Furthermore, it is anticipated that the patient will be medically stable for discharge from the hospital within 2 midnights of admission.   Author: Alm Dorn Castor, MD 04/15/2024 7:52 AM  For on call review [email or web address].   This document was prepared using Dragon voice recognition software and may contain some unintended transcription errors.     [1] No Known Allergies  "

## 2024-04-15 NOTE — Progress Notes (Addendum)
 Patient received from ED, alert to self and can follow simple command, Cannot answer questions to complete admission.

## 2024-04-15 NOTE — ED Notes (Signed)
Changed brief

## 2024-04-16 ENCOUNTER — Inpatient Hospital Stay (HOSPITAL_COMMUNITY)

## 2024-04-16 DIAGNOSIS — Z8674 Personal history of sudden cardiac arrest: Secondary | ICD-10-CM | POA: Diagnosis not present

## 2024-04-16 DIAGNOSIS — I10 Essential (primary) hypertension: Secondary | ICD-10-CM | POA: Diagnosis present

## 2024-04-16 DIAGNOSIS — M545 Low back pain, unspecified: Secondary | ICD-10-CM | POA: Diagnosis present

## 2024-04-16 DIAGNOSIS — Z79899 Other long term (current) drug therapy: Secondary | ICD-10-CM | POA: Diagnosis not present

## 2024-04-16 DIAGNOSIS — Z7985 Long-term (current) use of injectable non-insulin antidiabetic drugs: Secondary | ICD-10-CM | POA: Diagnosis not present

## 2024-04-16 DIAGNOSIS — E785 Hyperlipidemia, unspecified: Secondary | ICD-10-CM | POA: Diagnosis present

## 2024-04-16 DIAGNOSIS — E66811 Obesity, class 1: Secondary | ICD-10-CM | POA: Diagnosis present

## 2024-04-16 DIAGNOSIS — G9341 Metabolic encephalopathy: Secondary | ICD-10-CM | POA: Diagnosis not present

## 2024-04-16 DIAGNOSIS — Z1152 Encounter for screening for COVID-19: Secondary | ICD-10-CM | POA: Diagnosis not present

## 2024-04-16 DIAGNOSIS — F4310 Post-traumatic stress disorder, unspecified: Secondary | ICD-10-CM | POA: Diagnosis present

## 2024-04-16 DIAGNOSIS — E039 Hypothyroidism, unspecified: Secondary | ICD-10-CM | POA: Diagnosis present

## 2024-04-16 DIAGNOSIS — I2510 Atherosclerotic heart disease of native coronary artery without angina pectoris: Secondary | ICD-10-CM | POA: Diagnosis present

## 2024-04-16 DIAGNOSIS — Z6832 Body mass index (BMI) 32.0-32.9, adult: Secondary | ICD-10-CM | POA: Diagnosis not present

## 2024-04-16 DIAGNOSIS — Z79890 Hormone replacement therapy (postmenopausal): Secondary | ICD-10-CM | POA: Diagnosis not present

## 2024-04-16 DIAGNOSIS — F319 Bipolar disorder, unspecified: Secondary | ICD-10-CM | POA: Diagnosis present

## 2024-04-16 DIAGNOSIS — B9683 Acinetobacter baumannii as the cause of diseases classified elsewhere: Secondary | ICD-10-CM | POA: Diagnosis present

## 2024-04-16 DIAGNOSIS — E876 Hypokalemia: Secondary | ICD-10-CM | POA: Diagnosis present

## 2024-04-16 DIAGNOSIS — G894 Chronic pain syndrome: Secondary | ICD-10-CM | POA: Diagnosis present

## 2024-04-16 DIAGNOSIS — R4182 Altered mental status, unspecified: Secondary | ICD-10-CM | POA: Diagnosis present

## 2024-04-16 DIAGNOSIS — Z85828 Personal history of other malignant neoplasm of skin: Secondary | ICD-10-CM | POA: Diagnosis not present

## 2024-04-16 DIAGNOSIS — R7881 Bacteremia: Secondary | ICD-10-CM | POA: Diagnosis present

## 2024-04-16 DIAGNOSIS — G928 Other toxic encephalopathy: Secondary | ICD-10-CM | POA: Diagnosis present

## 2024-04-16 DIAGNOSIS — E119 Type 2 diabetes mellitus without complications: Secondary | ICD-10-CM | POA: Diagnosis present

## 2024-04-16 DIAGNOSIS — F419 Anxiety disorder, unspecified: Secondary | ICD-10-CM | POA: Diagnosis present

## 2024-04-16 LAB — BASIC METABOLIC PANEL WITH GFR
Anion gap: 16 — ABNORMAL HIGH (ref 5–15)
BUN: 7 mg/dL (ref 6–20)
CO2: 20 mmol/L — ABNORMAL LOW (ref 22–32)
Calcium: 9.1 mg/dL (ref 8.9–10.3)
Chloride: 100 mmol/L (ref 98–111)
Creatinine, Ser: 0.61 mg/dL (ref 0.44–1.00)
GFR, Estimated: >60 mL/min
Glucose, Bld: 121 mg/dL — ABNORMAL HIGH (ref 70–99)
Potassium: 3.1 mmol/L — ABNORMAL LOW (ref 3.5–5.1)
Sodium: 135 mmol/L (ref 135–145)

## 2024-04-16 LAB — BLOOD CULTURE ID PANEL (REFLEXED) - BCID2

## 2024-04-16 LAB — CBC
HCT: 36.2 % (ref 36.0–46.0)
Hemoglobin: 11.4 g/dL — ABNORMAL LOW (ref 12.0–15.0)
MCH: 25.1 pg — ABNORMAL LOW (ref 26.0–34.0)
MCHC: 31.5 g/dL (ref 30.0–36.0)
MCV: 79.6 fL — ABNORMAL LOW (ref 80.0–100.0)
Platelets: 360 K/uL (ref 150–400)
RBC: 4.55 MIL/uL (ref 3.87–5.11)
RDW: 16.2 % — ABNORMAL HIGH (ref 11.5–15.5)
WBC: 19.2 K/uL — ABNORMAL HIGH (ref 4.0–10.5)
nRBC: 0 % (ref 0.0–0.2)

## 2024-04-16 LAB — URINE CULTURE: Culture: NO GROWTH

## 2024-04-16 LAB — GLUCOSE, CAPILLARY
Glucose-Capillary: 117 mg/dL — ABNORMAL HIGH (ref 70–99)
Glucose-Capillary: 120 mg/dL — ABNORMAL HIGH (ref 70–99)
Glucose-Capillary: 104 mg/dL — ABNORMAL HIGH (ref 70–99)
Glucose-Capillary: 143 mg/dL — ABNORMAL HIGH (ref 70–99)
Glucose-Capillary: 135 mg/dL — ABNORMAL HIGH (ref 70–99)

## 2024-04-16 LAB — MAGNESIUM: Magnesium: 1.8 mg/dL (ref 1.7–2.4)

## 2024-04-16 LAB — PHOSPHORUS: Phosphorus: 2.2 mg/dL — ABNORMAL LOW (ref 2.5–4.6)

## 2024-04-16 LAB — T4, FREE: Free T4: 1.15 ng/dL (ref 0.80–2.00)

## 2024-04-16 LAB — TSH: TSH: 0.622 u[IU]/mL (ref 0.350–4.500)

## 2024-04-16 MED ORDER — IOHEXOL 9 MG/ML PO SOLN
1000.0000 mL | ORAL | Status: AC
  Administered 2024-04-16: 1000 mL via ORAL

## 2024-04-16 MED ORDER — PROCHLORPERAZINE EDISYLATE 10 MG/2ML IJ SOLN
10.0000 mg | Freq: Four times a day (QID) | INTRAMUSCULAR | Status: DC | PRN
  Administered 2024-04-16 – 2024-04-17 (×2): 10 mg via INTRAVENOUS
  Filled 2024-04-16 (×2): qty 2

## 2024-04-16 MED ORDER — IOHEXOL 300 MG/ML  SOLN
100.0000 mL | Freq: Once | INTRAMUSCULAR | Status: AC | PRN
  Administered 2024-04-16: 100 mL via INTRAVENOUS

## 2024-04-16 MED ORDER — POTASSIUM PHOSPHATES 15 MMOLE/5ML IV SOLN
15.0000 mmol | Freq: Once | INTRAVENOUS | Status: AC
  Administered 2024-04-16: 15 mmol via INTRAVENOUS
  Filled 2024-04-16: qty 5

## 2024-04-16 MED ORDER — MAGNESIUM SULFATE 2 GM/50ML IV SOLN
2.0000 g | Freq: Once | INTRAVENOUS | Status: AC
  Administered 2024-04-16: 2 g via INTRAVENOUS
  Filled 2024-04-16: qty 50

## 2024-04-16 MED ORDER — POTASSIUM CHLORIDE 2 MEQ/ML IV SOLN
INTRAVENOUS | Status: DC
  Filled 2024-04-16 (×4): qty 1000

## 2024-04-16 MED ORDER — CHLORHEXIDINE GLUCONATE CLOTH 2 % EX PADS
6.0000 | MEDICATED_PAD | Freq: Every day | CUTANEOUS | Status: DC
  Administered 2024-04-16 – 2024-04-17 (×2): 6 via TOPICAL

## 2024-04-16 MED ORDER — POTASSIUM CHLORIDE 10 MEQ/100ML IV SOLN
10.0000 meq | INTRAVENOUS | Status: AC
  Administered 2024-04-16 (×4): 10 meq via INTRAVENOUS
  Filled 2024-04-16 (×4): qty 100

## 2024-04-16 NOTE — Progress Notes (Signed)
 " PROGRESS NOTE    [name]  FMW:[id number] DOB: [birth year] DOA: 04/14/2024 PCP: [name], PA-C    Brief Narrative:   [name] is a 53 y.o. female with past medical history significant for HTN, HLD, CAD, DM2, hypothyroidism, anxiety/depression, chronic low back pain/cervical DDD, headache, history of cardiac arrest, skin cancer, drug-seeking behavior who presented to [facility] ED on 04/14/2024 from home with confusion, nausea and vomiting.  Patient unable to participate due to her confusion.  Only oriented to name despite more than 13 hours since arrival.  Denied headache, no chest pain, no back pain, no abdominal pain.  In the ED, temperature 99.6 F, HR 110, RR 18, BP 155/135, SpO2 100% on room air.  WBC 20.5, hemoglobin 13.2, plate count 572.  Sodium 139, potassium 3.9, chloride 103, CO2 21, glucose 140, BUN 9, creatinine 0.80.  AST 17, ALT 8, total bilirubin 0.5.  Ammonia level 22.  Lactic acid 1.5.  Acetaminophen  level less than 10.  Salicylate level less than 7.0.  COVID/influenza/RSV PCR negative.  Urinalysis unrevealing.  UDS positive for amphetamines.  Chest x-ray with no acute chest findings.  CT head without contrast with no acute intracranial abnormality.  Blood cultures and urine culture obtained.  The patient received ondansetron  4 mg IVP x 2 and 1000 mL of normal saline bolus. TRH consulted for admission for further evaluation and management of acute metabolic/toxic encephalopathy.   Assessment & Plan:   Acute metabolic/toxic encephalopathy, POA UDS positive for amphetamines Patient presenting with confusion, unable to participate in HPI.  Unclear etiology.  Patient is afebrile but with elevated WBC count of 20.5.  Ammonia level within normal limits.  Lactic acid normal.  Acetaminophen  and salicylate level normal.  COVID/influenza/RSV PCR negative.  Urinalysis negative.  CT head unrevealing.  UDS is positive for amphetamines, review of EMR does not  show medical prescription to suggest prescribed this at baseline.  Additionally patient with positive blood cultures as below. -- Supportive care, treatment as below  Intractable nausea/vomiting Patient continues with recurrent intractable nausea and vomiting.   -- CT abdomen/pelvis with contrast -- LR with 20 mEq KCl at 100 mL/h -- Clear liquid diet -- Antiemetics, Zofran /Compazine  as needed  Acinetobacter calcoaceticus/Baumannii Bacteremia Unclear etiology.  Urinalysis/urine culture negative. -- Blood cultures 2 out of 4 + for Acinetobacter calcoaceticus/Baumannii  -- Unasyn  3 g IV every 6 hours -- Follow-up blood culture sensitivities   Urinary retention Patient continues to appear to have abdominal discomfort.  Overnight In-N-Out cath with greater than 800 mL urine removed.  Continues to look uncomfortable. -- Foley catheter placement  Hypokalemia Hypomagnesemia Hypophosphatemia Potassium 3.1, magnesium  1.8, phosphorus 2.2.  Will replete. -- Repeat electrolytes in a.m.  HTN On carvedilol  6.25 g p.o. twice daily at baseline -- BP 166/91 -- Hydralazine  20 mg IV q4h PRN  -- Labetalol  10 mg IV q2h PRN  HLD CAD  DM2 Hemoglobin A1c 5.8% on 10/24/2023.  Hypothyroidism TSH 0.62, within normal limits.  PTSD Bipolar disorder Anxiety/depression On Abilify , clonazepam , Cymbalta  at baseline but awaiting medication reconciliation. -- Hold oral medications given nausea/vomiting  Chronic low back pain/cervical DDD Avoid narcotics/sedating medications given encephalopathy as above.  Oxycodone , Cymbalta , tizanidine , pregabalin  outpatient.  Obesity, class I Body mass index is 32.61 kg/m.   DVT prophylaxis: SCDs Start: 04/15/24 0758    Code Status: Full Code Family Communication: No family present at bedside  Disposition Plan:  Level of care: Progressive Status is: Inpatient Remains inpatient appropriate  because: IV antibiotics, IV fluids, needs advancement of diet with  toleration    Consultants:  None  Procedures:  None  Antimicrobials:  Unasyn  12/24>>   Subjective: Patient seen examined bedside, lying in bed.  RN and NT present at bedside.  Patient continues to point towards her abdomen.  Poorly communicative.  No family present at bedside.  Overnight patient required In-N-Out catheterization due to urinary retention, 800 mL urine out.  Concerned that she is continue to have urinary retention.  Unclear to full etiology to her encephalopathy, although does appear to have bacteremia, as well as amphetamines noted on UDS.  Continues with nausea and vomiting.  Will check CT abdomen/pelvis and have Foley catheter placed.  Objective: Vitals:   04/16/24 0050 04/16/24 0157 04/16/24 0622 04/16/24 0954  BP: (!) 161/90 (!) 146/102 (!) 144/91 (!) 144/79  Pulse:  95 90 82  Resp:  18 18   Temp:  97.9 F (36.6 C) 99 F (37.2 C) 97.7 F (36.5 C)  TempSrc:   Oral Oral  SpO2:  95% 95% 100%  Weight:      Height:        Intake/Output Summary (Last 24 hours) at 04/16/2024 1306 Last data filed at 04/16/2024 0400 Gross per 24 hour  Intake 2732.78 ml  Output 1700 ml  Net 1032.78 ml   Filed Weights   04/14/24 2221  Weight: 86.2 kg    Examination:  Physical Exam: GEN: NAD, alert, confused, obese, chronically ill in appearance, appears older than stated age HEENT: NCAT, PERRL, EOMI, sclera clear, dry mucous membranes PULM: CTAB w/o wheezes/crackles, normal respiratory effort, on room air CV: RRR w/o M/G/R GI: abd soft, NTND, + BS MSK: no peripheral edema, muscle strength globally intact 5/5 bilateral upper/lower extremities NEURO: Does not appear to have any focal neurological deficit other than cognition with continued confusion PSYCH: Unable to fully evaluate Integumentary: No concerning rashes/lesions/wounds noted on exposed skin surfaces    Data Reviewed: I have personally reviewed following labs and imaging studies  CBC: Recent Labs  Lab  04/14/24 2230 04/15/24 0801 04/16/24 1109  WBC 20.5* 17.2* 19.2*  HGB 13.2 10.9* 11.4*  HCT 42.6 35.4* 36.2  MCV 81.0 82.1 79.6*  PLT 427* 341 360   Basic Metabolic Panel: Recent Labs  Lab 04/14/24 2230 04/15/24 0801 04/15/24 1654 04/16/24 1109  NA 139 141  --  135  K 3.9 3.2*  --  3.1*  CL 103 104  --  100  CO2 21* 23  --  20*  GLUCOSE 140* 126*  --  121*  BUN 9 9  --  7  CREATININE 0.80 0.77  --  0.61  CALCIUM  10.3 9.6  --  9.1  MG  --  2.2  --  1.8  PHOS  --  2.3* 1.9* 2.2*   GFR: Estimated Creatinine Clearance: 86.4 mL/min (by C-G formula based on SCr of 0.61 mg/dL). Liver Function Tests: Recent Labs  Lab 04/14/24 2230 04/15/24 0801  AST 17 14*  ALT 8 8  ALKPHOS 121 102  BILITOT 0.5 0.5  PROT 9.5* 8.1  ALBUMIN  4.2 3.8   No results for input(s): LIPASE, AMYLASE in the last 168 hours. Recent Labs  Lab 04/15/24 0129  AMMONIA 22   Coagulation Profile: No results for input(s): INR, PROTIME in the last 168 hours. Cardiac Enzymes: No results for input(s): CKTOTAL, CKMB, CKMBINDEX, TROPONINI in the last 168 hours. BNP (last 3 results) No results for input(s): PROBNP in the last  8760 hours. HbA1C: No results for input(s): HGBA1C in the last 72 hours. CBG: Recent Labs  Lab 04/14/24 2238 04/16/24 0511 04/16/24 0744 04/16/24 1240  GLUCAP 136* 117* 120* 104*   Lipid Profile: No results for input(s): CHOL, HDL, LDLCALC, TRIG, CHOLHDL, LDLDIRECT in the last 72 hours. Thyroid  Function Tests: Recent Labs    04/16/24 1109  TSH 0.622   Anemia Panel: No results for input(s): VITAMINB12, FOLATE, FERRITIN, TIBC, IRON , RETICCTPCT in the last 72 hours. Sepsis Labs: Recent Labs  Lab 04/15/24 0150  LATICACIDVEN 1.5    Recent Results (from the past 240 hours)  Resp panel by RT-PCR (RSV, Flu A&B, Covid) Anterior Nasal Swab     Status: None   Collection Time: 04/15/24 12:28 AM   Specimen: Anterior Nasal Swab   Result Value Ref Range Status   SARS Coronavirus 2 by RT PCR NEGATIVE NEGATIVE Final    Comment: (NOTE) SARS-CoV-2 target nucleic acids are NOT DETECTED.  The SARS-CoV-2 RNA is generally detectable in upper respiratory specimens during the acute phase of infection. The lowest concentration of SARS-CoV-2 viral copies this assay can detect is 138 copies/mL. A negative result does not preclude SARS-Cov-2 infection and should not be used as the sole basis for treatment or other patient management decisions. A negative result may occur with  improper specimen collection/handling, submission of specimen other than nasopharyngeal swab, presence of viral mutation(s) within the areas targeted by this assay, and inadequate number of viral copies(<138 copies/mL). A negative result must be combined with clinical observations, patient history, and epidemiological information. The expected result is Negative.  Fact Sheet for Patients:  bloggercourse.com  Fact Sheet for Healthcare Providers:  seriousbroker.it  This test is no t yet approved or cleared by the [place]  FDA and  has been authorized for detection and/or diagnosis of SARS-CoV-2 by FDA under an Emergency Use Authorization (EUA). This EUA will remain  in effect (meaning this test can be used) for the duration of the COVID-19 declaration under Section 564(b)(1) of the Act, 21 U.S.C.section 360bbb-3(b)(1), unless the authorization is terminated  or revoked sooner.       Influenza A by PCR NEGATIVE NEGATIVE Final   Influenza B by PCR NEGATIVE NEGATIVE Final    Comment: (NOTE) The Xpert Xpress SARS-CoV-2/FLU/RSV plus assay is intended as an aid in the diagnosis of influenza from Nasopharyngeal swab specimens and should not be used as a sole basis for treatment. Nasal washings and aspirates are unacceptable for Xpert Xpress SARS-CoV-2/FLU/RSV testing.  Fact Sheet for  Patients: bloggercourse.com  Fact Sheet for Healthcare Providers: seriousbroker.it  This test is not yet approved or cleared by the [place]  FDA and has been authorized for detection and/or diagnosis of SARS-CoV-2 by FDA under an Emergency Use Authorization (EUA). This EUA will remain in effect (meaning this test can be used) for the duration of the COVID-19 declaration under Section 564(b)(1) of the Act, 21 U.S.C. section 360bbb-3(b)(1), unless the authorization is terminated or revoked.     Resp Syncytial Virus by PCR NEGATIVE NEGATIVE Final    Comment: (NOTE) Fact Sheet for Patients: bloggercourse.com  Fact Sheet for Healthcare Providers: seriousbroker.it  This test is not yet approved or cleared by the [place]  FDA and has been authorized for detection and/or diagnosis of SARS-CoV-2 by FDA under an Emergency Use Authorization (EUA). This EUA will remain in effect (meaning this test can be used) for the duration of the COVID-19 declaration under Section 564(b)(1) of the Act, 21 U.S.C.  section 360bbb-3(b)(1), unless the authorization is terminated or revoked.  Performed at [facility], [address] [place], [place] [address]   Blood culture (routine x 2)     Status: Abnormal (Preliminary result)   Collection Time: 04/15/24  1:58 AM   Specimen: BLOOD  Result Value Ref Range Status   Specimen Description   Final    BLOOD SITE NOT SPECIFIED Performed at [facility], [address], [place], [place] [address]    Special Requests   Final    BOTTLES DRAWN AEROBIC AND ANAEROBIC Blood Culture adequate volume Performed at [facility], [address] [place], [place] [address]    Culture  Setup Time   Final    GRAM NEGATIVE RODS IN BOTH AEROBIC AND ANAEROBIC BOTTLES CRITICAL RESULT CALLED TO, READ BACK BY  AND VERIFIED WITH: PHARMD E. JACKSON [id number]  Performed at [facility], [address] [place], [place] [address]    Culture ACINETOBACTER CALCOACETICUS/BAUMANNII COMPLEX (A)  Final   Report Status PENDING  Incomplete  Blood Culture ID Panel (Reflexed)     Status: Abnormal   Collection Time: 04/15/24  1:58 AM  Result Value Ref Range Status   Enterococcus faecalis NOT DETECTED NOT DETECTED Final   Enterococcus Faecium NOT DETECTED NOT DETECTED Final   Listeria monocytogenes NOT DETECTED NOT DETECTED Final   Staphylococcus species NOT DETECTED NOT DETECTED Final   Staphylococcus aureus (BCID) NOT DETECTED NOT DETECTED Final   Staphylococcus epidermidis NOT DETECTED NOT DETECTED Final   Staphylococcus lugdunensis NOT DETECTED NOT DETECTED Final   Streptococcus species NOT DETECTED NOT DETECTED Final   Streptococcus agalactiae NOT DETECTED NOT DETECTED Final   Streptococcus pneumoniae NOT DETECTED NOT DETECTED Final   Streptococcus pyogenes NOT DETECTED NOT DETECTED Final   A.calcoaceticus-baumannii DETECTED (A) NOT DETECTED Final    Comment: CRITICAL RESULT CALLED TO, READ BACK BY AND VERIFIED WITH: PHARMD E. JACKSON [id number]     Bacteroides fragilis NOT DETECTED NOT DETECTED Final   Enterobacterales NOT DETECTED NOT DETECTED Final   Enterobacter cloacae complex NOT DETECTED NOT DETECTED Final   Escherichia coli NOT DETECTED NOT DETECTED Final   Klebsiella aerogenes NOT DETECTED NOT DETECTED Final   Klebsiella oxytoca NOT DETECTED NOT DETECTED Final   Klebsiella pneumoniae NOT DETECTED NOT DETECTED Final   Proteus species NOT DETECTED NOT DETECTED Final   Salmonella species NOT DETECTED NOT DETECTED Final   Serratia marcescens NOT DETECTED NOT DETECTED Final   Haemophilus influenzae NOT DETECTED NOT DETECTED Final   Neisseria meningitidis NOT DETECTED NOT DETECTED Final   Pseudomonas aeruginosa NOT DETECTED NOT DETECTED Final   Stenotrophomonas maltophilia NOT  DETECTED NOT DETECTED Final   Candida albicans NOT DETECTED NOT DETECTED Final   Candida auris NOT DETECTED NOT DETECTED Final   Candida glabrata NOT DETECTED NOT DETECTED Final   Candida krusei NOT DETECTED NOT DETECTED Final   Candida parapsilosis NOT DETECTED NOT DETECTED Final   Candida tropicalis NOT DETECTED NOT DETECTED Final   Cryptococcus neoformans/gattii NOT DETECTED NOT DETECTED Final   CTX-M ESBL NOT DETECTED NOT DETECTED Final   Carbapenem resistance IMP NOT DETECTED NOT DETECTED Final   Carbapenem resistance KPC NOT DETECTED NOT DETECTED Final   Carbapenem resistance NDM NOT DETECTED NOT DETECTED Final   Carbapenem resistance VIM NOT DETECTED NOT DETECTED Final    Comment: Performed at [facility], [address] [place], [place] [address]  Urine Culture     Status:  None   Collection Time: 04/15/24  2:11 AM   Specimen: Urine, Clean Catch  Result Value Ref Range Status   Specimen Description   Final    URINE, CLEAN CATCH Performed at [facility], [address], [place], [place] [address]    Special Requests   Final    NONE Performed at [facility], [address] [place], [place] [address]    Culture   Final    NO GROWTH Performed at [facility], [address], [place], [place] [address]    Report Status 04/16/2024 FINAL  Final  Culture, blood (Routine X 2) w Reflex to ID Panel     Status: None (Preliminary result)   Collection Time: 04/15/24  4:54 PM   Specimen: BLOOD LEFT HAND  Result Value Ref Range Status   Specimen Description   Final    BLOOD LEFT HAND Performed at [facility], [address], [place], [place] [address]    Special Requests   Final    BOTTLES DRAWN AEROBIC ONLY Blood Culture results may not be optimal due to an inadequate volume of blood received in culture bottles Performed at [facility], [address] [place], [place] [address]    Culture   Final     NO GROWTH < 24 HOURS Performed at [facility], [address] [place], [place] [address]    Report Status PENDING  Incomplete         Radiology Studies: CT HEAD WO CONTRAST ( ) Result Date: 04/15/2024 EXAM: CT HEAD WITHOUT CONTRAST 04/15/2024 12:36:14 PM TECHNIQUE: CT of the head was performed without the administration of intravenous contrast. Automated exposure control, iterative reconstruction, and/or weight based adjustment of the mA/kV was utilized to reduce the radiation dose to as low as reasonably achievable. COMPARISON: Head CT 10/24/2023 and MRI 03/14/2018. CLINICAL HISTORY: Mental status change, unknown cause. Hypertension, nausea, and vomiting. FINDINGS: LIMITATIONS: The examination is mildly motion degraded despite repeat imaging. BRAIN AND VENTRICLES: There is no evidence of an acute infarct, intracranial hemorrhage, mass, midline shift, hydrocephalus, or extra-axial fluid collection. Cerebral volume is normal. ORBITS: No acute abnormality. SINUSES: No acute abnormality. SOFT TISSUES AND SKULL: No acute soft tissue abnormality. No skull fracture. IMPRESSION: 1. No acute intracranial abnormality. Electronically signed by: Dasie Hamburg MD 04/15/2024 12:40 PM EST RP Workstation: [id number]   DG Chest Port 1 View Result Date: 04/14/2024 CLINICAL DATA:  Altered mental status. EXAM: PORTABLE CHEST 1 VIEW COMPARISON:  11/21/2023 radiograph and CT FINDINGS: Chronic volume loss in the right hemithorax with elevated hemidiaphragm. Stable heart size and mediastinal contours. No acute airspace disease, pleural effusion, or pneumothorax. No acute osseous findings. IMPRESSION: No acute chest findings. Chronic volume loss in the right hemithorax with elevated hemidiaphragm. Electronically Signed   By: Andrea Gasman M.D.   On: 04/14/2024 23:08        Scheduled Meds:  Chlorhexidine  Gluconate Cloth  6 each Topical Daily   phosphorus  500 mg Oral QID   Continuous Infusions:   ampicillin -sulbactam (UNASYN ) IV 3 g (04/16/24 0951)   lactated ringers  1,000 mL with potassium chloride  20 mEq infusion 100 mL/hr at 04/16/24 1041   magnesium  sulfate bolus IVPB     potassium chloride      potassium PHOSPHATE  IVPB (in mmol)       LOS: 0 days    Time spent: 52 minutes spent on 04/16/2024 caring for this patient face-to-face including chart review,  ordering labs/tests, documenting, discussion with nursing staff, consultants, updating family and interview/physical exam    Camellia PARAS Milla Wahlberg, DO [facility] Available via Epic secure chat 7am-7pm After these hours, please refer to coverage provider listed on [email or web address] 04/16/2024, 1:06 PM   "

## 2024-04-16 NOTE — Progress Notes (Signed)
 Patient vomited twice since shift change. Per husband, she was vomiting at home before  coming to the hospital.  IV Zofran  given as PRN. Also noted that patient doesn't have urine output since admission. Bladder scan done and showed 607 ml. In and out done as ordered with output of 850 ml. Pt tolerated the in and out. Made comfortable in the bed. Will continue to monitor.

## 2024-04-16 NOTE — Progress Notes (Signed)
 PHARMACY - PHYSICIAN COMMUNICATION CRITICAL VALUE ALERT - BLOOD CULTURE IDENTIFICATION (BCID)  [name] is an 53 y.o. female who presented to [facility] on 04/14/2024 with a chief complaint of AMS.  Patient is currently on unasyn  for acinobacter bacteremia.  Micro lab reported on 04/16/24 that 1 of 3 blood culture bottles collected on 04/15/24 has GPC in clusters (BCID= staph epi).  Name of physician (or Provider) Contacted: Dr. Austria  Current antibiotics: unasyn   Changes to prescribed antibiotics recommended:  - staph epi is likely a contaminant, but unasyn  should cover for this.  Continue unasyn  3gm q6h  Results for orders placed or performed during the hospital encounter of 04/14/24  Blood Culture ID Panel (Reflexed) (Collected: 04/15/2024  4:54 PM)  Result Value Ref Range   Enterococcus faecalis NOT DETECTED NOT DETECTED   Enterococcus Faecium NOT DETECTED NOT DETECTED   Listeria monocytogenes NOT DETECTED NOT DETECTED   Staphylococcus species DETECTED (A) NOT DETECTED   Staphylococcus aureus (BCID) NOT DETECTED NOT DETECTED   Staphylococcus epidermidis DETECTED (A) NOT DETECTED   Staphylococcus lugdunensis NOT DETECTED NOT DETECTED   Streptococcus species NOT DETECTED NOT DETECTED   Streptococcus agalactiae NOT DETECTED NOT DETECTED   Streptococcus pneumoniae NOT DETECTED NOT DETECTED   Streptococcus pyogenes NOT DETECTED NOT DETECTED   A.calcoaceticus-baumannii NOT DETECTED NOT DETECTED   Bacteroides fragilis NOT DETECTED NOT DETECTED   Enterobacterales NOT DETECTED NOT DETECTED   Enterobacter cloacae complex NOT DETECTED NOT DETECTED   Escherichia coli NOT DETECTED NOT DETECTED   Klebsiella aerogenes NOT DETECTED NOT DETECTED   Klebsiella oxytoca NOT DETECTED NOT DETECTED   Klebsiella pneumoniae NOT DETECTED NOT DETECTED   Proteus species NOT DETECTED NOT DETECTED   Salmonella species NOT DETECTED NOT DETECTED   Serratia marcescens NOT DETECTED NOT DETECTED    Haemophilus influenzae NOT DETECTED NOT DETECTED   Neisseria meningitidis NOT DETECTED NOT DETECTED   Pseudomonas aeruginosa NOT DETECTED NOT DETECTED   Stenotrophomonas maltophilia NOT DETECTED NOT DETECTED   Candida albicans NOT DETECTED NOT DETECTED   Candida auris NOT DETECTED NOT DETECTED   Candida glabrata NOT DETECTED NOT DETECTED   Candida krusei NOT DETECTED NOT DETECTED   Candida parapsilosis NOT DETECTED NOT DETECTED   Candida tropicalis NOT DETECTED NOT DETECTED   Cryptococcus neoformans/gattii NOT DETECTED NOT DETECTED   Methicillin resistance mecA/C NOT DETECTED NOT DETECTED    Osie Iantha SQUIBB 04/16/2024  6:24 PM

## 2024-04-17 DIAGNOSIS — G9341 Metabolic encephalopathy: Secondary | ICD-10-CM | POA: Diagnosis not present

## 2024-04-17 LAB — CBC
HCT: 33.4 % — ABNORMAL LOW (ref 36.0–46.0)
Hemoglobin: 10.6 g/dL — ABNORMAL LOW (ref 12.0–15.0)
MCH: 25.5 pg — ABNORMAL LOW (ref 26.0–34.0)
MCHC: 31.7 g/dL (ref 30.0–36.0)
MCV: 80.3 fL (ref 80.0–100.0)
Platelets: 338 K/uL (ref 150–400)
RBC: 4.16 MIL/uL (ref 3.87–5.11)
RDW: 16.8 % — ABNORMAL HIGH (ref 11.5–15.5)
WBC: 16.5 K/uL — ABNORMAL HIGH (ref 4.0–10.5)
nRBC: 0 % (ref 0.0–0.2)

## 2024-04-17 LAB — BLOOD CULTURE ID PANEL (REFLEXED) - BCID2

## 2024-04-17 LAB — COMPREHENSIVE METABOLIC PANEL WITH GFR
ALT: 9 U/L (ref 0–44)
AST: 12 U/L — ABNORMAL LOW (ref 15–41)
Albumin: 3.4 g/dL — ABNORMAL LOW (ref 3.5–5.0)
Alkaline Phosphatase: 92 U/L (ref 38–126)
Anion gap: 14 (ref 5–15)
BUN: <5 mg/dL — ABNORMAL LOW (ref 6–20)
CO2: 21 mmol/L — ABNORMAL LOW (ref 22–32)
Calcium: 8.8 mg/dL — ABNORMAL LOW (ref 8.9–10.3)
Chloride: 102 mmol/L (ref 98–111)
Creatinine, Ser: 0.59 mg/dL (ref 0.44–1.00)
GFR, Estimated: >60 mL/min
Glucose, Bld: 130 mg/dL — ABNORMAL HIGH (ref 70–99)
Potassium: 3.6 mmol/L (ref 3.5–5.1)
Sodium: 137 mmol/L (ref 135–145)
Total Bilirubin: 0.5 mg/dL (ref 0.0–1.2)
Total Protein: 7.4 g/dL (ref 6.5–8.1)

## 2024-04-17 LAB — GLUCOSE, CAPILLARY
Glucose-Capillary: 101 mg/dL — ABNORMAL HIGH (ref 70–99)
Glucose-Capillary: 110 mg/dL — ABNORMAL HIGH (ref 70–99)
Glucose-Capillary: 118 mg/dL — ABNORMAL HIGH (ref 70–99)
Glucose-Capillary: 128 mg/dL — ABNORMAL HIGH (ref 70–99)
Glucose-Capillary: 137 mg/dL — ABNORMAL HIGH (ref 70–99)
Glucose-Capillary: 144 mg/dL — ABNORMAL HIGH (ref 70–99)

## 2024-04-17 LAB — CULTURE, BLOOD (ROUTINE X 2)

## 2024-04-17 LAB — PHOSPHORUS: Phosphorus: 1.8 mg/dL — ABNORMAL LOW (ref 2.5–4.6)

## 2024-04-17 LAB — LIPASE, BLOOD: Lipase: 26 U/L (ref 11–51)

## 2024-04-17 LAB — MAGNESIUM: Magnesium: 2.2 mg/dL (ref 1.7–2.4)

## 2024-04-17 MED ORDER — KETOROLAC TROMETHAMINE 15 MG/ML IJ SOLN
15.0000 mg | Freq: Once | INTRAMUSCULAR | Status: AC
  Administered 2024-04-17: 15 mg via INTRAVENOUS
  Filled 2024-04-17: qty 1

## 2024-04-17 MED ORDER — ACETAMINOPHEN 500 MG PO TABS
1000.0000 mg | ORAL_TABLET | Freq: Once | ORAL | Status: AC
  Administered 2024-04-17: 1000 mg via ORAL
  Filled 2024-04-17: qty 2

## 2024-04-17 MED ORDER — POTASSIUM CHLORIDE 2 MEQ/ML IV SOLN
INTRAVENOUS | Status: DC
  Filled 2024-04-17 (×2): qty 1000

## 2024-04-17 MED ORDER — LEVOTHYROXINE SODIUM 50 MCG PO TABS
50.0000 ug | ORAL_TABLET | Freq: Every day | ORAL | Status: DC
  Administered 2024-04-18 – 2024-04-20 (×3): 50 ug via ORAL
  Filled 2024-04-17 (×3): qty 1

## 2024-04-17 MED ORDER — POTASSIUM PHOSPHATES 15 MMOLE/5ML IV SOLN
30.0000 mmol | Freq: Once | INTRAVENOUS | Status: AC
  Administered 2024-04-17: 30 mmol via INTRAVENOUS
  Filled 2024-04-17: qty 10

## 2024-04-17 MED ORDER — METOCLOPRAMIDE HCL 5 MG/ML IJ SOLN
10.0000 mg | Freq: Three times a day (TID) | INTRAMUSCULAR | Status: DC
  Administered 2024-04-17 – 2024-04-18 (×4): 10 mg via INTRAVENOUS
  Filled 2024-04-17 (×4): qty 2

## 2024-04-17 MED ORDER — LIDOCAINE 5 % EX PTCH
2.0000 | MEDICATED_PATCH | CUTANEOUS | Status: DC
  Administered 2024-04-17: 2 via TRANSDERMAL
  Filled 2024-04-17 (×2): qty 2

## 2024-04-17 MED ORDER — CARVEDILOL 12.5 MG PO TABS
12.5000 mg | ORAL_TABLET | Freq: Two times a day (BID) | ORAL | Status: DC
  Administered 2024-04-17 – 2024-04-20 (×6): 12.5 mg via ORAL
  Filled 2024-04-17 (×6): qty 1

## 2024-04-17 MED ORDER — TIZANIDINE HCL 4 MG PO TABS
4.0000 mg | ORAL_TABLET | Freq: Four times a day (QID) | ORAL | Status: DC | PRN
  Administered 2024-04-17 – 2024-04-20 (×11): 4 mg via ORAL
  Filled 2024-04-17 (×11): qty 1

## 2024-04-17 NOTE — Progress Notes (Addendum)
 " [name]    [name]  FMW:[id number] DOB: 11/18/1970 DOA: 04/14/2024 PCP: [name], PA-C    Brief Narrative:   [name] is a 53 y.o. female with past medical history significant for HTN, HLD, CAD, DM2, hypothyroidism, anxiety/depression, chronic low back pain/cervical DDD, headache, history of cardiac arrest, skin cancer, drug-seeking behavior who presented to [facility] ED on 04/14/2024 from home with confusion, nausea and vomiting.  Patient unable to participate due to her confusion.  Only oriented to name despite more than 13 hours since arrival.  Denied headache, no chest pain, no back pain, no abdominal pain.  In the ED, temperature 99.6 F, HR 110, RR 18, BP 155/135, SpO2 100% on room air.  WBC 20.5, hemoglobin 13.2, plate count 572.  Sodium 139, potassium 3.9, chloride 103, CO2 21, glucose 140, BUN 9, creatinine 0.80.  AST 17, ALT 8, total bilirubin 0.5.  Ammonia level 22.  Lactic acid 1.5.  Acetaminophen  level less than 10.  Salicylate level less than 7.0.  COVID/influenza/RSV PCR negative.  Urinalysis unrevealing.  UDS positive for amphetamines.  Chest x-ray with no acute chest findings.  CT head without contrast with no acute intracranial abnormality.  Blood cultures and urine culture obtained.  The patient received ondansetron  4 mg IVP x 2 and 1000 mL of normal saline bolus. TRH consulted for admission for further evaluation and management of acute metabolic/toxic encephalopathy.   Assessment & Plan:   Acute metabolic/toxic encephalopathy, POA UDS positive for amphetamines Patient presenting with confusion, unable to participate in HPI.  Unclear etiology.  Patient is afebrile but with elevated WBC count of 20.5.  Ammonia level within normal limits.  Lactic acid normal.  Acetaminophen  and salicylate level normal.  COVID/influenza/RSV PCR negative.  Urinalysis negative.  CT head unrevealing.  UDS is positive for amphetamines, review of EMR does not  show medical prescription to suggest prescribed this at baseline.  Additionally patient with positive blood cultures as below. -- Supportive care, treatment as below  Intractable nausea/vomiting Patient continues with recurrent intractable nausea and vomiting.  Lipase within normal limits.  Unclear etiology but likely complicated by Mounjaro  use.  CT abdomen/pelvis with contrast 12/25 with mild limitations but no acute findings noted. -- LR with 20 mEq KCl at 100 mL/h -- Clear liquid diet -- Start Reglan  10 mg IV every 8 hours -- Antiemetics, Zofran /Compazine  as needed  Acinetobacter calcoaceticus/Baumannii Bacteremia Unclear etiology.  Urinalysis/urine culture negative. -- Blood cultures 2 out of 4 + for Acinetobacter calcoaceticus/Baumannii. 1 out of 3 positive for GPC's in clusters with BC ID staph epi, likely contaminant. -- Unasyn  3 g IV every 6 hours -- Repeat blood cultures today -- Follow-up blood culture sensitivities   Urinary retention Patient continues to appear to have abdominal discomfort.  Overnight In-N-Out cath with greater than 800 mL urine removed.  Continues to look uncomfortable. -- Foley catheter placed 12/25  Hypokalemia Hypomagnesemia Hypophosphatemia Potassium 3.6, phosphorus 1.8.  Will replete. -- Repeat electrolytes in a.m.  HTN -- BP 141/90 -- Restart carvedilol  12.5 g p.o. twice daily -- Hydralazine  20 mg IV q4h PRN  -- Labetalol  10 mg IV q2h PRN  HLD CAD -- Hold home atorvastatin  for now   DM2 Hemoglobin A1c 5.8% on 10/24/2023.  Hypothyroidism TSH 0.62, within normal limits. -- Levothyroxine  50 mcg p.o. daily  PTSD Bipolar disorder Anxiety/depression On Abilify  10mg  POP BID, clonazepam  0.5mg  PO daily PRN, Cymbalta  60 mg PO BID, prazosin  10mg  PO at bedtime,  Seroquel  50 mg PO qHS at baseline  -- Hold oral medications given nausea/vomiting and encephalopathy  Chronic low back pain/cervical DDD Avoid narcotics/sedating medications given  encephalopathy as above.  Oxycodone , Cymbalta , tizanidine , pregabalin  outpatient.  Obesity, class I Body mass index is 32.61 kg/m.   DVT prophylaxis: SCDs Start: 04/15/24 0758    Code Status: Full Code Family Communication: No family present at bedside  Disposition Plan:  Level of care: Progressive Status is: Inpatient Remains inpatient appropriate because: IV antibiotics, IV fluids, repeat blood cultures today, needs advancement of diet with toleration    Consultants:  None  Procedures:  None  Antimicrobials:  Unasyn  12/24>>   Subjective: Patient seen examined bedside, lying in bed.  No family present.  Sleeping but arousable.  Initially did not communicate but then started talking appropriately.  Continues with nausea.  Requesting restart of her Zanaflex .  Repeating blood cultures today.  Remains on IV antibiotics.  Denies headache, no chest pain, no shortness of breath, no abdominal pain, no fever.  No acute events overnight per nursing staff.  Objective: Vitals:   04/16/24 1945 04/16/24 2156 04/16/24 2257 04/17/24 0537  BP: (!) 159/91 (!) 157/86 119/78 (!) 141/90  Pulse: 99 90 (!) 110 91  Resp: 18   18  Temp: 98.4 F (36.9 C)   98.8 F (37.1 C)  TempSrc: Oral     SpO2: 100%  97% 96%  Weight:      Height:        Intake/Output Summary (Last 24 hours) at 04/17/2024 1229 Last data filed at 04/17/2024 0645 Gross per 24 hour  Intake 1841.71 ml  Output 1450 ml  Net 391.71 ml   Filed Weights   04/14/24 2221  Weight: 86.2 kg    Examination:  Physical Exam: GEN: NAD, alert, obese, chronically ill in appearance, appears older than stated age HEENT: NCAT, PERRL, EOMI, sclera clear, dry mucous membranes PULM: CTAB w/o wheezes/crackles, normal respiratory effort, on room air CV: RRR w/o M/G/R GI: abd soft, NTND, + BS MSK: no peripheral edema, muscle strength globally intact 5/5 bilateral upper/lower extremities NEURO: No focal neurological deficit PSYCH:  Depressed mood, flat affect Integumentary: No concerning rashes/lesions/wounds noted on exposed skin surfaces    Data Reviewed: I have personally reviewed following labs and imaging studies  CBC: Recent Labs  Lab 04/14/24 2230 04/15/24 0801 04/16/24 1109 04/17/24 0504  WBC 20.5* 17.2* 19.2* 16.5*  HGB 13.2 10.9* 11.4* 10.6*  HCT 42.6 35.4* 36.2 33.4*  MCV 81.0 82.1 79.6* 80.3  PLT 427* 341 360 338   Basic Metabolic Panel: Recent Labs  Lab 04/14/24 2230 04/15/24 0801 04/15/24 1654 04/16/24 1109 04/17/24 0504  NA 139 141  --  135 137  K 3.9 3.2*  --  3.1* 3.6  CL 103 104  --  100 102  CO2 21* 23  --  20* 21*  GLUCOSE 140* 126*  --  121* 130*  BUN 9 9  --  7 <5*  CREATININE 0.80 0.77  --  0.61 0.59  CALCIUM  10.3 9.6  --  9.1 8.8*  MG  --  2.2  --  1.8 2.2  PHOS  --  2.3* 1.9* 2.2* 1.8*   GFR: Estimated Creatinine Clearance: 86.4 mL/min (by C-G formula based on SCr of 0.59 mg/dL). Liver Function Tests: Recent Labs  Lab 04/14/24 2230 04/15/24 0801 04/17/24 0504  AST 17 14* 12*  ALT 8 8 9   ALKPHOS 121 102 92  BILITOT 0.5 0.5 0.5  PROT 9.5* 8.1 7.4  ALBUMIN  4.2 3.8 3.4*   Recent Labs  Lab 04/17/24 0504  LIPASE 26   Recent Labs  Lab 04/15/24 0129  AMMONIA 22   Coagulation Profile: No results for input(s): INR, PROTIME in the last 168 hours. Cardiac Enzymes: No results for input(s): CKTOTAL, CKMB, CKMBINDEX, TROPONINI in the last 168 hours. BNP (last 3 results) No results for input(s): PROBNP in the last 8760 hours. HbA1C: No results for input(s): HGBA1C in the last 72 hours. CBG: Recent Labs  Lab 04/16/24 2011 04/17/24 0018 04/17/24 0423 04/17/24 0745 04/17/24 1132  GLUCAP 135* 144* 137* 118* 128*   Lipid Profile: No results for input(s): CHOL, HDL, LDLCALC, TRIG, CHOLHDL, LDLDIRECT in the last 72 hours. Thyroid  Function Tests: Recent Labs    04/16/24 1109  TSH 0.622  FREET4 1.15   Anemia Panel: No results  for input(s): VITAMINB12, FOLATE, FERRITIN, TIBC, IRON , RETICCTPCT in the last 72 hours. Sepsis Labs: Recent Labs  Lab 04/15/24 0150  LATICACIDVEN 1.5    Recent Results (from the past 240 hours)  Resp panel by RT-PCR (RSV, Flu A&B, Covid) Anterior Nasal Swab     Status: None   Collection Time: 04/15/24 12:28 AM   Specimen: Anterior Nasal Swab  Result Value Ref Range Status   SARS Coronavirus 2 by RT PCR NEGATIVE NEGATIVE Final    Comment: ([name]) SARS-CoV-2 target nucleic acids are NOT DETECTED.  The SARS-CoV-2 RNA is generally detectable in upper respiratory specimens during the acute phase of infection. The lowest concentration of SARS-CoV-2 viral copies this assay can detect is 138 copies/mL. A negative result does not preclude SARS-Cov-2 infection and should not be used as the sole basis for treatment or other patient management decisions. A negative result may occur with  improper specimen collection/handling, submission of specimen other than nasopharyngeal swab, presence of viral mutation(s) within the areas targeted by this assay, and inadequate number of viral copies(<138 copies/mL). A negative result must be combined with clinical observations, patient history, and epidemiological information. The expected result is Negative.  Fact Sheet for Patients:  bloggercourse.com  Fact Sheet for Healthcare Providers:  seriousbroker.it  This test is no t yet approved or cleared by the [place]  FDA and  has been authorized for detection and/or diagnosis of SARS-CoV-2 by FDA under an Emergency Use Authorization (EUA). This EUA will remain  in effect (meaning this test can be used) for the duration of the COVID-19 declaration under Section 564(b)(1) of the Act, 21 U.S.C.section 360bbb-3(b)(1), unless the authorization is terminated  or revoked sooner.       Influenza A by PCR NEGATIVE NEGATIVE Final    Influenza B by PCR NEGATIVE NEGATIVE Final    Comment: ([name]) The Xpert Xpress SARS-CoV-2/FLU/RSV plus assay is intended as an aid in the diagnosis of influenza from Nasopharyngeal swab specimens and should not be used as a sole basis for treatment. Nasal washings and aspirates are unacceptable for Xpert Xpress SARS-CoV-2/FLU/RSV testing.  Fact Sheet for Patients: bloggercourse.com  Fact Sheet for Healthcare Providers: seriousbroker.it  This test is not yet approved or cleared by the [place]  FDA and has been authorized for detection and/or diagnosis of SARS-CoV-2 by FDA under an Emergency Use Authorization (EUA). This EUA will remain in effect (meaning this test can be used) for the duration of the COVID-19 declaration under Section 564(b)(1) of the Act, 21 U.S.C. section 360bbb-3(b)(1), unless the authorization is terminated or revoked.     Resp Syncytial Virus by PCR  NEGATIVE NEGATIVE Final    Comment: ([name]) Fact Sheet for Patients: bloggercourse.com  Fact Sheet for Healthcare Providers: seriousbroker.it  This test is not yet approved or cleared by the [place]  FDA and has been authorized for detection and/or diagnosis of SARS-CoV-2 by FDA under an Emergency Use Authorization (EUA). This EUA will remain in effect (meaning this test can be used) for the duration of the COVID-19 declaration under Section 564(b)(1) of the Act, 21 U.S.C. section 360bbb-3(b)(1), unless the authorization is terminated or revoked.  Performed at [facility], [address], [place], [place] [address]   Blood culture (routine x 2)     Status: Abnormal (Preliminary result)   Collection Time: 04/15/24  1:58 AM   Specimen: BLOOD  Result Value Ref Range Status   Specimen Description   Final    BLOOD SITE NOT SPECIFIED Performed at [facility], [address] [place], [place] [address]    Special Requests   Final    BOTTLES DRAWN AEROBIC AND ANAEROBIC Blood Culture adequate volume Performed at [facility], [address] [place], [place] [address]    Culture  Setup Time   Final    GRAM NEGATIVE RODS IN BOTH AEROBIC AND ANAEROBIC BOTTLES CRITICAL RESULT CALLED TO, READ BACK BY AND VERIFIED WITH: PHARMD E. JACKSON [id number]@2105FH  Performed at [facility], [address], [place], [place] [address]    Culture ACINETOBACTER CALCOACETICUS/BAUMANNII COMPLEX (A)  Final   Report Status PENDING  Incomplete  Blood Culture ID Panel (Reflexed)     Status: Abnormal   Collection Time: 04/15/24  1:58 AM  Result Value Ref Range Status   Enterococcus faecalis NOT DETECTED NOT DETECTED Final   Enterococcus Faecium NOT DETECTED NOT DETECTED Final   Listeria monocytogenes NOT DETECTED NOT DETECTED Final   Staphylococcus species NOT DETECTED NOT DETECTED Final   Staphylococcus aureus (BCID) NOT DETECTED NOT DETECTED Final   Staphylococcus epidermidis NOT DETECTED NOT DETECTED Final   Staphylococcus lugdunensis NOT DETECTED NOT DETECTED Final   Streptococcus species NOT DETECTED NOT DETECTED Final   Streptococcus agalactiae NOT DETECTED NOT DETECTED Final   Streptococcus pneumoniae NOT DETECTED NOT DETECTED Final   Streptococcus pyogenes NOT DETECTED NOT DETECTED Final   A.calcoaceticus-baumannii DETECTED (A) NOT DETECTED Final    Comment: CRITICAL RESULT CALLED TO, READ BACK BY AND VERIFIED WITH: PHARMD E. JACKSON [id number]@2105FH     Bacteroides fragilis NOT DETECTED NOT DETECTED Final   Enterobacterales NOT DETECTED NOT DETECTED Final   Enterobacter cloacae complex NOT DETECTED NOT DETECTED Final   Escherichia coli NOT DETECTED NOT DETECTED Final   Klebsiella aerogenes NOT DETECTED NOT DETECTED Final   Klebsiella oxytoca NOT DETECTED NOT DETECTED Final   Klebsiella pneumoniae NOT DETECTED NOT DETECTED Final   Proteus  species NOT DETECTED NOT DETECTED Final   Salmonella species NOT DETECTED NOT DETECTED Final   Serratia marcescens NOT DETECTED NOT DETECTED Final   Haemophilus influenzae NOT DETECTED NOT DETECTED Final   Neisseria meningitidis NOT DETECTED NOT DETECTED Final   Pseudomonas aeruginosa NOT DETECTED NOT DETECTED Final   Stenotrophomonas maltophilia NOT DETECTED NOT DETECTED Final   Candida albicans NOT DETECTED NOT DETECTED Final   Candida auris NOT DETECTED NOT DETECTED Final   Candida glabrata NOT DETECTED NOT DETECTED Final   Candida krusei NOT DETECTED NOT DETECTED Final   Candida parapsilosis NOT DETECTED NOT DETECTED Final   Candida tropicalis NOT DETECTED NOT DETECTED Final   Cryptococcus neoformans/gattii  NOT DETECTED NOT DETECTED Final   CTX-M ESBL NOT DETECTED NOT DETECTED Final   Carbapenem resistance IMP NOT DETECTED NOT DETECTED Final   Carbapenem resistance KPC NOT DETECTED NOT DETECTED Final   Carbapenem resistance NDM NOT DETECTED NOT DETECTED Final   Carbapenem resistance VIM NOT DETECTED NOT DETECTED Final    Comment: Performed at [facility], [address] [place], [place] [address]  Urine Culture     Status: None   Collection Time: 04/15/24  2:11 AM   Specimen: Urine, Clean Catch  Result Value Ref Range Status   Specimen Description   Final    URINE, CLEAN CATCH Performed at [facility], [address] [place], [place] [address]    Special Requests   Final    NONE Performed at [facility], [address] [place], [place] [address]    Culture   Final    NO GROWTH Performed at [facility], [address] [place], [place] [address]    Report Status 04/16/2024 FINAL  Final  Culture, blood (Routine X 2) w Reflex to ID Panel     Status: None (Preliminary result)   Collection Time: 04/15/24  4:54 PM   Specimen: BLOOD LEFT HAND  Result Value Ref Range Status   Specimen Description   Final    BLOOD LEFT  HAND Performed at [facility], [address], [place], [place] [address]    Special Requests   Final    BOTTLES DRAWN AEROBIC ONLY Blood Culture results may not be optimal due to an inadequate volume of blood received in culture bottles Performed at [facility], [address], [place], [place] [address]    Culture  Setup Time   Final    GRAM POSITIVE COCCI IN CLUSTERS AEROBIC BOTTLE ONLY CRITICAL RESULT CALLED TO, READ BACK BY AND VERIFIED WITH: [facility] AHN [facility] [phone] AT 1821 BY EC Performed at [facility], [address] [place], [place] [address]    Culture GRAM POSITIVE COCCI  Final   Report Status PENDING  Incomplete  Blood Culture ID Panel (Reflexed)     Status: Abnormal   Collection Time: 04/15/24  4:54 PM  Result Value Ref Range Status   Enterococcus faecalis NOT DETECTED NOT DETECTED Final   Enterococcus Faecium NOT DETECTED NOT DETECTED Final   Listeria monocytogenes NOT DETECTED NOT DETECTED Final   Staphylococcus species DETECTED (A) NOT DETECTED Final    Comment: CRITICAL RESULT CALLED TO, READ BACK BY AND VERIFIED WITH: PHARMD AHN PHAM [phone] AT 1821 BY EC    Staphylococcus aureus (BCID) NOT DETECTED NOT DETECTED Final   Staphylococcus epidermidis DETECTED (A) NOT DETECTED Final    Comment: CRITICAL RESULT CALLED TO, READ BACK BY AND VERIFIED WITH: PHARMD AHN PHAM [phone] AT 1821 BY EC    Staphylococcus lugdunensis NOT DETECTED NOT DETECTED Final   Streptococcus species NOT DETECTED NOT DETECTED Final   Streptococcus agalactiae NOT DETECTED NOT DETECTED Final   Streptococcus pneumoniae NOT DETECTED NOT DETECTED Final   Streptococcus pyogenes NOT DETECTED NOT DETECTED Final   A.calcoaceticus-baumannii NOT DETECTED NOT DETECTED Final   Bacteroides fragilis NOT DETECTED NOT DETECTED Final   Enterobacterales NOT DETECTED NOT DETECTED Final   Enterobacter cloacae complex NOT DETECTED NOT DETECTED Final   Escherichia coli NOT  DETECTED NOT DETECTED Final   Klebsiella aerogenes NOT DETECTED NOT DETECTED Final   Klebsiella oxytoca NOT DETECTED NOT DETECTED Final  Klebsiella pneumoniae NOT DETECTED NOT DETECTED Final   Proteus species NOT DETECTED NOT DETECTED Final   Salmonella species NOT DETECTED NOT DETECTED Final   Serratia marcescens NOT DETECTED NOT DETECTED Final   Haemophilus influenzae NOT DETECTED NOT DETECTED Final   Neisseria meningitidis NOT DETECTED NOT DETECTED Final   Pseudomonas aeruginosa NOT DETECTED NOT DETECTED Final   Stenotrophomonas maltophilia NOT DETECTED NOT DETECTED Final   Candida albicans NOT DETECTED NOT DETECTED Final   Candida auris NOT DETECTED NOT DETECTED Final   Candida glabrata NOT DETECTED NOT DETECTED Final   Candida krusei NOT DETECTED NOT DETECTED Final   Candida parapsilosis NOT DETECTED NOT DETECTED Final   Candida tropicalis NOT DETECTED NOT DETECTED Final   Cryptococcus neoformans/gattii NOT DETECTED NOT DETECTED Final   Methicillin resistance mecA/C NOT DETECTED NOT DETECTED Final    Comment: Performed at [facility], [address] [place], [place] [address]         Radiology Studies: CT ABDOMEN PELVIS W CONTRAST Result Date: 04/16/2024 EXAM: CT ABDOMEN AND PELVIS WITH CONTRAST 04/16/2024 11:37:53 AM TECHNIQUE: CT of the abdomen and pelvis was performed with the administration of 100 mL iohexol  (OMNIPAQUE ) 300 MG/ML solution. Multiplanar reformatted images are provided for review. Automated exposure control, iterative reconstruction, and/or weight-based adjustment of the mA/kV was utilized to reduce the radiation dose to as low as reasonably achievable. COMPARISON: 02/20/2024. CLINICAL HISTORY: Abdominal pain, acute, nonlocalized. FINDINGS: Mild multifactorial degradation, including left arm position not raised above the head. Thoracolumbar spine fixation hardware artifact. LOWER CHEST: Normal heart size. Right base scarring or subsegmental atelectasis.  LIVER: Likely artifactual hypoattenuation at the hepatic dome on image 14/2, absent on the prior exam. GALLBLADDER AND BILE DUCTS: Gallbladder is unremarkable. No biliary ductal dilatation. SPLEEN: Normal in size and morphology. PANCREAS: Normal, without duct dilatation or acute inflammation. ADRENAL GLANDS: Normal, without mass. KIDNEYS, URETERS AND BLADDER: No renal mass or hydronephrosis. Bladder collapse around a foley catheter. GI AND BOWEL: Normal stomach, without wall thickening. Normal small bowel caliber. Normal colon, terminal ileum, and appendix. PERITONEUM AND RETROPERITONEUM: No ascites. No free air. VASCULATURE: Abdominal aortic atherosclerosis. Aorta is normal in caliber. LYMPH NODES: No lymphadenopathy. REPRODUCTIVE ORGANS: Normal uterus, without adnexal mass. BONES AND SOFT TISSUES: T10 through L2 transpedical screw fixation. Moderate-to-severe T12 compression deformity is unchanged. A mild superior endplate compression deformity at L4 and minimal superior endplate compression deformity at L5 are unchanged. No focal soft tissue abnormality. IMPRESSION: 1. Mild limitations as detailed above. 2. Given this factor, no acute finding or explanation for patient's symptoms. 3. Aortic atherosclerosis ([id number]). Electronically signed by: Rockey Kilts MD 04/16/2024 03:48 PM EST RP Workstation: [id number]   CT HEAD WO CONTRAST ( ) Result Date: 04/15/2024 EXAM: CT HEAD WITHOUT CONTRAST 04/15/2024 12:36:14 PM TECHNIQUE: CT of the head was performed without the administration of intravenous contrast. Automated exposure control, iterative reconstruction, and/or weight based adjustment of the mA/kV was utilized to reduce the radiation dose to as low as reasonably achievable. COMPARISON: Head CT 10/24/2023 and MRI 03/14/2018. CLINICAL HISTORY: Mental status change, unknown cause. Hypertension, nausea, and vomiting. FINDINGS: LIMITATIONS: The examination is mildly motion degraded despite repeat imaging.  BRAIN AND VENTRICLES: There is no evidence of an acute infarct, intracranial hemorrhage, mass, midline shift, hydrocephalus, or extra-axial fluid collection. Cerebral volume is normal. ORBITS: No acute abnormality. SINUSES: No acute abnormality. SOFT TISSUES AND SKULL: No acute soft tissue abnormality. No skull fracture. IMPRESSION: 1. No acute intracranial abnormality. Electronically signed by: Dasie  Derrill MD 04/15/2024 12:40 PM EST RP Workstation: [id number]        Scheduled Meds:  Chlorhexidine  Gluconate Cloth  6 each Topical Daily   [START ON 04/18/2024] levothyroxine   50 mcg Oral QAC breakfast   metoCLOPramide  (REGLAN ) injection  10 mg Intravenous Q8H   Continuous Infusions:  ampicillin -sulbactam (UNASYN ) IV 3 g (04/17/24 0907)   lactated ringers  1,000 mL with potassium chloride  20 mEq infusion     potassium PHOSPHATE  IVPB (in mmol) 30 mmol (04/17/24 0909)     LOS: 1 day    Time spent: 50 minutes spent on 04/17/2024 caring for this patient face-to-face including chart review, ordering labs/tests, documenting, discussion with nursing staff, consultants, updating family and interview/physical exam    Camellia PARAS Socrates Cahoon, DO [facility] Available via Epic secure chat 7am-7pm After these hours, please refer to coverage provider listed on [email or web address] 04/17/2024, 12:29 PM   "

## 2024-04-17 NOTE — Plan of Care (Signed)
 m

## 2024-04-18 ENCOUNTER — Other Ambulatory Visit (HOSPITAL_COMMUNITY): Payer: Self-pay

## 2024-04-18 LAB — GLUCOSE, CAPILLARY
Glucose-Capillary: 102 mg/dL — ABNORMAL HIGH (ref 70–99)
Glucose-Capillary: 109 mg/dL — ABNORMAL HIGH (ref 70–99)
Glucose-Capillary: 135 mg/dL — ABNORMAL HIGH (ref 70–99)
Glucose-Capillary: 77 mg/dL (ref 70–99)
Glucose-Capillary: 84 mg/dL (ref 70–99)
Glucose-Capillary: 90 mg/dL (ref 70–99)

## 2024-04-18 LAB — COMPREHENSIVE METABOLIC PANEL WITH GFR
ALT: 7 U/L (ref 0–44)
AST: 10 U/L — ABNORMAL LOW (ref 15–41)
Albumin: 3 g/dL — ABNORMAL LOW (ref 3.5–5.0)
Alkaline Phosphatase: 62 U/L (ref 38–126)
Anion gap: 13 (ref 5–15)
BUN: 6 mg/dL (ref 6–20)
CO2: 19 mmol/L — ABNORMAL LOW (ref 22–32)
Calcium: 8.6 mg/dL — ABNORMAL LOW (ref 8.9–10.3)
Chloride: 105 mmol/L (ref 98–111)
Creatinine, Ser: 0.62 mg/dL (ref 0.44–1.00)
GFR, Estimated: >60 mL/min
Glucose, Bld: 104 mg/dL — ABNORMAL HIGH (ref 70–99)
Potassium: 3.8 mmol/L (ref 3.5–5.1)
Sodium: 138 mmol/L (ref 135–145)
Total Bilirubin: 0.4 mg/dL (ref 0.0–1.2)
Total Protein: 6.2 g/dL — ABNORMAL LOW (ref 6.5–8.1)

## 2024-04-18 LAB — CBC
HCT: 29.2 % — ABNORMAL LOW (ref 36.0–46.0)
Hemoglobin: 9.1 g/dL — ABNORMAL LOW (ref 12.0–15.0)
MCH: 25.3 pg — ABNORMAL LOW (ref 26.0–34.0)
MCHC: 31.2 g/dL (ref 30.0–36.0)
MCV: 81.1 fL (ref 80.0–100.0)
Platelets: 251 K/uL (ref 150–400)
RBC: 3.6 MIL/uL — ABNORMAL LOW (ref 3.87–5.11)
RDW: 16.9 % — ABNORMAL HIGH (ref 11.5–15.5)
WBC: 8.6 K/uL (ref 4.0–10.5)
nRBC: 0 % (ref 0.0–0.2)

## 2024-04-18 LAB — PHOSPHORUS: Phosphorus: 2.9 mg/dL (ref 2.5–4.6)

## 2024-04-18 LAB — MAGNESIUM: Magnesium: 2.1 mg/dL (ref 1.7–2.4)

## 2024-04-18 MED ORDER — TRAZODONE HCL 100 MG PO TABS
600.0000 mg | ORAL_TABLET | Freq: Every day | ORAL | Status: DC
  Administered 2024-04-18 – 2024-04-19 (×2): 600 mg via ORAL
  Filled 2024-04-18 (×3): qty 6

## 2024-04-18 MED ORDER — CLONAZEPAM 0.5 MG PO TABS: 0.5000 mg | ORAL_TABLET | Freq: Every day | ORAL | Status: DC | PRN

## 2024-04-18 MED ORDER — DULOXETINE HCL 60 MG PO CPEP
60.0000 mg | ORAL_CAPSULE | Freq: Two times a day (BID) | ORAL | Status: DC
  Administered 2024-04-18 – 2024-04-20 (×5): 60 mg via ORAL
  Filled 2024-04-18 (×4): qty 1

## 2024-04-18 MED ORDER — OXYCODONE HCL 5 MG PO TABS
10.0000 mg | ORAL_TABLET | Freq: Four times a day (QID) | ORAL | Status: DC | PRN
  Administered 2024-04-18: 10 mg via ORAL
  Administered 2024-04-18 – 2024-04-19 (×6): 15 mg via ORAL
  Administered 2024-04-20: 10 mg via ORAL
  Administered 2024-04-20: 15 mg via ORAL
  Filled 2024-04-18 (×6): qty 3
  Filled 2024-04-18: qty 2
  Filled 2024-04-18 (×2): qty 3

## 2024-04-18 MED ORDER — OXYCODONE HCL 5 MG PO TABS: 15.0000 mg | ORAL_TABLET | Freq: Four times a day (QID) | ORAL | Status: DC | PRN

## 2024-04-18 MED ORDER — ARIPIPRAZOLE 10 MG PO TABS
10.0000 mg | ORAL_TABLET | Freq: Two times a day (BID) | ORAL | Status: DC
  Administered 2024-04-18 – 2024-04-20 (×5): 10 mg via ORAL
  Filled 2024-04-18 (×6): qty 1

## 2024-04-18 NOTE — Plan of Care (Signed)
 Continue with plan of care. Diet progressed to regular and pt tolerated well

## 2024-04-18 NOTE — Progress Notes (Signed)
 " PROGRESS NOTE    [name]  FMW:[id number] DOB: January 12, 1971 DOA: 04/14/2024 PCP: [name], PA-C    Brief Narrative:   [name] is a 53 y.o. female with past medical history significant for HTN, HLD, CAD, DM2, hypothyroidism, anxiety/depression, chronic low back pain/cervical DDD, headache, history of cardiac arrest, skin cancer, drug-seeking behavior who presented to [facility] ED on 04/14/2024 from home with confusion, nausea and vomiting.  Patient unable to participate due to her confusion.  Only oriented to name despite more than 13 hours since arrival.  Denied headache, no chest pain, no back pain, no abdominal pain.  In the ED, temperature 99.6 F, HR 110, RR 18, BP 155/135, SpO2 100% on room air.  WBC 20.5, hemoglobin 13.2, plate count 572.  Sodium 139, potassium 3.9, chloride 103, CO2 21, glucose 140, BUN 9, creatinine 0.80.  AST 17, ALT 8, total bilirubin 0.5.  Ammonia level 22.  Lactic acid 1.5.  Acetaminophen  level less than 10.  Salicylate level less than 7.0.  COVID/influenza/RSV PCR negative.  Urinalysis unrevealing.  UDS positive for amphetamines.  Chest x-ray with no acute chest findings.  CT head without contrast with no acute intracranial abnormality.  Blood cultures and urine culture obtained.  The patient received ondansetron  4 mg IVP x 2 and 1000 mL of normal saline bolus. TRH consulted for admission for further evaluation and management of acute metabolic/toxic encephalopathy.   Assessment & Plan:   Acute metabolic/toxic encephalopathy, POA UDS positive for amphetamines Patient presenting with confusion, unable to participate in HPI.  Unclear etiology.  Patient is afebrile but with elevated WBC count of 20.5.  Ammonia level within normal limits.  Lactic acid normal.  Acetaminophen  and salicylate level normal.  COVID/influenza/RSV PCR negative.  Urinalysis negative.  CT head unrevealing.  UDS is positive for amphetamines, review of EMR does not  show medical prescription to suggest prescribed this at baseline.  Additionally patient with positive blood cultures as below. -- Supportive care, treatment as below  Acinetobacter calcoaceticus/Baumannii Bacteremia Unclear etiology.  Urinalysis/urine culture negative. -- Blood cultures 2 out of 4 + for Acinetobacter calcoaceticus/Baumannii. 1 out of 3 positive for GPC's in clusters with BC ID staph epi, likely contaminant. (No oral option per PharmD) -- Repeat blood cultures 12/26: No growth less than 24 hours -- Unasyn  3 g IV every 6 hours; likely will need 5-7 days of treatment  Intractable nausea/vomiting: Resolved Patient continues with recurrent intractable nausea and vomiting.  Lipase within normal limits.  Unclear etiology but likely complicated by Mounjaro  use.  CT abdomen/pelvis with contrast 12/25 with mild limitations but no acute findings noted. -- Gaile to regular diet today -- Antiemetics, Zofran /Compazine  as needed  Urinary retention Patient continues to appear to have abdominal discomfort.  Overnight In-N-Out cath with greater than 800 mL urine removed.  Continues to look uncomfortable. -- Foley catheter placed 12/25; will discontinue for voiding trial today -- Bladder scan at next void following Foley catheter removal or if no void in 6 hours  Hypokalemia Hypomagnesemia Hypophosphatemia Repleted.  HTN -- BP 141/90 -- Carvedilol  12.5 g p.o. twice daily -- Hydralazine  20 mg IV q4h PRN  -- Labetalol  10 mg IV q2h PRN  HLD CAD -- Hold home atorvastatin  for now   DM2 Hemoglobin A1c 5.8% on 10/24/2023.  Hypothyroidism TSH 0.62, within normal limits. -- Levothyroxine  50 mcg p.o. daily  PTSD Bipolar disorder Anxiety/depression On Abilify  10mg  POP BID, clonazepam  0.5mg  PO daily PRN, Cymbalta  60  mg PO BID, prazosin  10mg  PO at bedtime, Seroquel  50 mg PO qHS at baseline   Chronic low back pain/cervical DDD Oxycodone , Cymbalta , tizanidine , pregabalin   outpatient.  Obesity, class I Body mass index is 32.61 kg/m.   DVT prophylaxis: SCDs Start: 04/15/24 0758    Code Status: Full Code Family Communication: No family present at bedside  Disposition Plan:  Level of care: Progressive Status is: Inpatient Remains inpatient appropriate because: IV antibiotics, advancing diet today, removing Foley catheter for voiding trial    Consultants:  None  Procedures:  None  Antimicrobials:  Unasyn  12/24>>   Subjective: Patient seen examined bedside, lying in bed.  No family present.  Tolerating clear liquid diet, requests further advancement.  Discussed with patient remains on IV antibiotics with no oral options for her bacteremia and will need 5 to 7 days of treatment.  Also will attempt voiding trial today.  Requesting restart of her chronic pain medications.  No other questions or concerns at this time.  Denies headache, no chest pain, no shortness of breath, no abdominal pain, no fever.  No acute events overnight per nursing staff.  Objective: Vitals:   04/17/24 1709 04/17/24 2130 04/18/24 0434 04/18/24 0800  BP: 120/66 126/74 132/71 (!) 140/74  Pulse: 95 88 79 82  Resp:  16 20   Temp:  98.4 F (36.9 C) 98.5 F (36.9 C)   TempSrc:  Oral Oral   SpO2:  100% 100%   Weight:      Height:        Intake/Output Summary (Last 24 hours) at 04/18/2024 1227 Last data filed at 04/18/2024 0900 Gross per 24 hour  Intake 1639.16 ml  Output 2300 ml  Net -660.84 ml   Filed Weights   04/14/24 2221  Weight: 86.2 kg    Examination:  Physical Exam: GEN: NAD, alert, obese, chronically ill in appearance, appears older than stated age HEENT: NCAT, PERRL, EOMI, sclera clear, dry mucous membranes PULM: CTAB w/o wheezes/crackles, normal respiratory effort, on room air CV: RRR w/o M/G/R GI: abd soft, NTND, + BS MSK: no peripheral edema, muscle strength globally intact 5/5 bilateral upper/lower extremities NEURO: No focal neurological  deficit PSYCH: Depressed mood, flat affect Integumentary: No concerning rashes/lesions/wounds noted on exposed skin surfaces    Data Reviewed: I have personally reviewed following labs and imaging studies  CBC: Recent Labs  Lab 04/14/24 2230 04/15/24 0801 04/16/24 1109 04/17/24 0504 04/18/24 0528  WBC 20.5* 17.2* 19.2* 16.5* 8.6  HGB 13.2 10.9* 11.4* 10.6* 9.1*  HCT 42.6 35.4* 36.2 33.4* 29.2*  MCV 81.0 82.1 79.6* 80.3 81.1  PLT 427* 341 360 338 251   Basic Metabolic Panel: Recent Labs  Lab 04/14/24 2230 04/15/24 0801 04/15/24 1654 04/16/24 1109 04/17/24 0504 04/18/24 0528  NA 139 141  --  135 137 138  K 3.9 3.2*  --  3.1* 3.6 3.8  CL 103 104  --  100 102 105  CO2 21* 23  --  20* 21* 19*  GLUCOSE 140* 126*  --  121* 130* 104*  BUN 9 9  --  7 <5* 6  CREATININE 0.80 0.77  --  0.61 0.59 0.62  CALCIUM  10.3 9.6  --  9.1 8.8* 8.6*  MG  --  2.2  --  1.8 2.2 2.1  PHOS  --  2.3* 1.9* 2.2* 1.8* 2.9   GFR: Estimated Creatinine Clearance: 86.4 mL/min (by C-G formula based on SCr of 0.62 mg/dL). Liver Function Tests: Recent Labs  Lab  04/14/24 2230 04/15/24 0801 04/17/24 0504 04/18/24 0528  AST 17 14* 12* 10*  ALT 8 8 9 7   ALKPHOS 121 102 92 62  BILITOT 0.5 0.5 0.5 0.4  PROT 9.5* 8.1 7.4 6.2*  ALBUMIN  4.2 3.8 3.4* 3.0*   Recent Labs  Lab 04/17/24 0504  LIPASE 26   Recent Labs  Lab 04/15/24 0129  AMMONIA 22   Coagulation Profile: No results for input(s): INR, PROTIME in the last 168 hours. Cardiac Enzymes: No results for input(s): CKTOTAL, CKMB, CKMBINDEX, TROPONINI in the last 168 hours. BNP (last 3 results) No results for input(s): PROBNP in the last 8760 hours. HbA1C: No results for input(s): HGBA1C in the last 72 hours. CBG: Recent Labs  Lab 04/17/24 2127 04/18/24 0008 04/18/24 0435 04/18/24 0732 04/18/24 1138  GLUCAP 101* 109* 102* 90 77   Lipid Profile: No results for input(s): CHOL, HDL, LDLCALC, TRIG, CHOLHDL,  LDLDIRECT in the last 72 hours. Thyroid  Function Tests: Recent Labs    04/16/24 1109  TSH 0.622  FREET4 1.15   Anemia Panel: No results for input(s): VITAMINB12, FOLATE, FERRITIN, TIBC, IRON , RETICCTPCT in the last 72 hours. Sepsis Labs: Recent Labs  Lab 04/15/24 0150  LATICACIDVEN 1.5    Recent Results (from the past 240 hours)  Resp panel by RT-PCR (RSV, Flu A&B, Covid) Anterior Nasal Swab     Status: None   Collection Time: 04/15/24 12:28 AM   Specimen: Anterior Nasal Swab  Result Value Ref Range Status   SARS Coronavirus 2 by RT PCR NEGATIVE NEGATIVE Final    Comment: (NOTE) SARS-CoV-2 target nucleic acids are NOT DETECTED.  The SARS-CoV-2 RNA is generally detectable in upper respiratory specimens during the acute phase of infection. The lowest concentration of SARS-CoV-2 viral copies this assay can detect is 138 copies/mL. A negative result does not preclude SARS-Cov-2 infection and should not be used as the sole basis for treatment or other patient management decisions. A negative result may occur with  improper specimen collection/handling, submission of specimen other than nasopharyngeal swab, presence of viral mutation(s) within the areas targeted by this assay, and inadequate number of viral copies(<138 copies/mL). A negative result must be combined with clinical observations, patient history, and epidemiological information. The expected result is Negative.  Fact Sheet for Patients:  bloggercourse.com  Fact Sheet for Healthcare Providers:  seriousbroker.it  This test is no t yet approved or cleared by the [place]  FDA and  has been authorized for detection and/or diagnosis of SARS-CoV-2 by FDA under an Emergency Use Authorization (EUA). This EUA will remain  in effect (meaning this test can be used) for the duration of the COVID-19 declaration under Section 564(b)(1) of the Act,  21 U.S.C.section 360bbb-3(b)(1), unless the authorization is terminated  or revoked sooner.       Influenza A by PCR NEGATIVE NEGATIVE Final   Influenza B by PCR NEGATIVE NEGATIVE Final    Comment: (NOTE) The Xpert Xpress SARS-CoV-2/FLU/RSV plus assay is intended as an aid in the diagnosis of influenza from Nasopharyngeal swab specimens and should not be used as a sole basis for treatment. Nasal washings and aspirates are unacceptable for Xpert Xpress SARS-CoV-2/FLU/RSV testing.  Fact Sheet for Patients: bloggercourse.com  Fact Sheet for Healthcare Providers: seriousbroker.it  This test is not yet approved or cleared by the [place]  FDA and has been authorized for detection and/or diagnosis of SARS-CoV-2 by FDA under an Emergency Use Authorization (EUA). This EUA will remain in effect (meaning this test can  be used) for the duration of the COVID-19 declaration under Section 564(b)(1) of the Act, 21 U.S.C. section 360bbb-3(b)(1), unless the authorization is terminated or revoked.     Resp Syncytial Virus by PCR NEGATIVE NEGATIVE Final    Comment: (NOTE) Fact Sheet for Patients: bloggercourse.com  Fact Sheet for Healthcare Providers: seriousbroker.it  This test is not yet approved or cleared by the [place]  FDA and has been authorized for detection and/or diagnosis of SARS-CoV-2 by FDA under an Emergency Use Authorization (EUA). This EUA will remain in effect (meaning this test can be used) for the duration of the COVID-19 declaration under Section 564(b)(1) of the Act, 21 U.S.C. section 360bbb-3(b)(1), unless the authorization is terminated or revoked.  Performed at [facility], [address] [place], [place] [address]   Blood culture (routine x 2)     Status: Abnormal   Collection Time: 04/15/24  1:58 AM   Specimen: BLOOD  Result Value  Ref Range Status   Specimen Description   Final    BLOOD SITE NOT SPECIFIED Performed at [facility], [address] [place], [place] [address]    Special Requests   Final    BOTTLES DRAWN AEROBIC AND ANAEROBIC Blood Culture adequate volume Performed at [facility], [address], [place], [place] [address]    Culture  Setup Time   Final    GRAM NEGATIVE RODS IN BOTH AEROBIC AND ANAEROBIC BOTTLES CRITICAL RESULT CALLED TO, READ BACK BY AND VERIFIED WITH: PHARMD E. JACKSON [id number]@2105FH  Performed at [facility], [address] [place], [place] [address]    Culture ACINETOBACTER CALCOACETICUS/BAUMANNII COMPLEX (A)  Final   Report Status 04/17/2024 FINAL  Final   Organism ID, Bacteria ACINETOBACTER CALCOACETICUS/BAUMANNII COMPLEX  Final      Susceptibility   Acinetobacter calcoaceticus/baumannii complex - MIC*    MINOCYCLINE <=0.5 SENSITIVE Sensitive     IMIPENEM <=0.5 SENSITIVE Sensitive     PIP/TAZO Value in next row Sensitive      <=4 SENSITIVEThis is a modified FDA-approved test that has been validated and its performance characteristics determined by the reporting laboratory.  This laboratory is certified under the Clinical Laboratory Improvement Amendments CLIA as qualified to perform high complexity clinical laboratory testing.    AMPICILLIN /SULBACTAM Value in next row Sensitive      <=4 SENSITIVEThis is a modified FDA-approved test that has been validated and its performance characteristics determined by the reporting laboratory.  This laboratory is certified under the Clinical Laboratory Improvement Amendments CLIA as qualified to perform high complexity clinical laboratory testing.    MEROPENEM Value in next row Sensitive      <=4 SENSITIVEThis is a modified FDA-approved test that has been validated and its performance characteristics determined by the reporting laboratory.  This laboratory is certified under the Clinical Laboratory  Improvement Amendments CLIA as qualified to perform high complexity clinical laboratory testing.    * ACINETOBACTER CALCOACETICUS/BAUMANNII COMPLEX  Blood Culture ID Panel (Reflexed)     Status: Abnormal   Collection Time: 04/15/24  1:58 AM  Result Value Ref Range Status   Enterococcus faecalis NOT DETECTED NOT DETECTED Final   Enterococcus Faecium NOT DETECTED NOT DETECTED Final   Listeria monocytogenes NOT DETECTED NOT DETECTED Final   Staphylococcus species NOT DETECTED NOT DETECTED Final   Staphylococcus aureus (BCID) NOT DETECTED NOT DETECTED Final   Staphylococcus epidermidis NOT DETECTED NOT DETECTED Final   Staphylococcus lugdunensis NOT DETECTED NOT DETECTED Final  Streptococcus species NOT DETECTED NOT DETECTED Final   Streptococcus agalactiae NOT DETECTED NOT DETECTED Final   Streptococcus pneumoniae NOT DETECTED NOT DETECTED Final   Streptococcus pyogenes NOT DETECTED NOT DETECTED Final   A.calcoaceticus-baumannii DETECTED (A) NOT DETECTED Final    Comment: CRITICAL RESULT CALLED TO, READ BACK BY AND VERIFIED WITH: PHARMD E. JACKSON [id number]@2105FH     Bacteroides fragilis NOT DETECTED NOT DETECTED Final   Enterobacterales NOT DETECTED NOT DETECTED Final   Enterobacter cloacae complex NOT DETECTED NOT DETECTED Final   Escherichia coli NOT DETECTED NOT DETECTED Final   Klebsiella aerogenes NOT DETECTED NOT DETECTED Final   Klebsiella oxytoca NOT DETECTED NOT DETECTED Final   Klebsiella pneumoniae NOT DETECTED NOT DETECTED Final   Proteus species NOT DETECTED NOT DETECTED Final   Salmonella species NOT DETECTED NOT DETECTED Final   Serratia marcescens NOT DETECTED NOT DETECTED Final   Haemophilus influenzae NOT DETECTED NOT DETECTED Final   Neisseria meningitidis NOT DETECTED NOT DETECTED Final   Pseudomonas aeruginosa NOT DETECTED NOT DETECTED Final   Stenotrophomonas maltophilia NOT DETECTED NOT DETECTED Final   Candida albicans NOT DETECTED NOT DETECTED Final   Candida  auris NOT DETECTED NOT DETECTED Final   Candida glabrata NOT DETECTED NOT DETECTED Final   Candida krusei NOT DETECTED NOT DETECTED Final   Candida parapsilosis NOT DETECTED NOT DETECTED Final   Candida tropicalis NOT DETECTED NOT DETECTED Final   Cryptococcus neoformans/gattii NOT DETECTED NOT DETECTED Final   CTX-M ESBL NOT DETECTED NOT DETECTED Final   Carbapenem resistance IMP NOT DETECTED NOT DETECTED Final   Carbapenem resistance KPC NOT DETECTED NOT DETECTED Final   Carbapenem resistance NDM NOT DETECTED NOT DETECTED Final   Carbapenem resistance VIM NOT DETECTED NOT DETECTED Final    Comment: Performed at [facility], [address], [place], [place] [address]  Urine Culture     Status: None   Collection Time: 04/15/24  2:11 AM   Specimen: Urine, Clean Catch  Result Value Ref Range Status   Specimen Description   Final    URINE, CLEAN CATCH Performed at [facility], [address], [place], [place] [address]    Special Requests   Final    NONE Performed at [facility], [address], [place], [place] [address]    Culture   Final    NO GROWTH Performed at [facility], [address], [place], [place] [address]    Report Status 04/16/2024 FINAL  Final  Culture, blood (Routine X 2) w Reflex to ID Panel     Status: Abnormal   Collection Time: 04/15/24  4:54 PM   Specimen: BLOOD LEFT HAND  Result Value Ref Range Status   Specimen Description   Final    BLOOD LEFT HAND Performed at [facility], [address], [place], [place] [address]    Special Requests   Final    BOTTLES DRAWN AEROBIC ONLY Blood Culture results may not be optimal due to an inadequate volume of blood received in culture bottles Performed at [facility], [address], [place], [place] [address]    Culture  Setup Time   Final    GRAM POSITIVE COCCI IN CLUSTERS AEROBIC BOTTLE ONLY CRITICAL RESULT CALLED TO, READ BACK BY  AND VERIFIED WITH: PHARMD AHN PHAM [id number] AT 1821 BY EC    Culture (A)  Final    STAPHYLOCOCCUS HOMINIS THE SIGNIFICANCE OF ISOLATING THIS ORGANISM FROM A  SINGLE SET OF BLOOD CULTURES WHEN MULTIPLE SETS ARE DRAWN IS UNCERTAIN. PLEASE NOTIFY THE MICROBIOLOGY DEPARTMENT WITHIN ONE WEEK IF SPECIATION AND SENSITIVITIES ARE REQUIRED. Performed at [facility], [address], [place], [place] [address]    Report Status 04/17/2024 FINAL  Final  Blood Culture ID Panel (Reflexed)     Status: Abnormal   Collection Time: 04/15/24  4:54 PM  Result Value Ref Range Status   Enterococcus faecalis NOT DETECTED NOT DETECTED Final   Enterococcus Faecium NOT DETECTED NOT DETECTED Final   Listeria monocytogenes NOT DETECTED NOT DETECTED Final   Staphylococcus species DETECTED (A) NOT DETECTED Final    Comment: CRITICAL RESULT CALLED TO, READ BACK BY AND VERIFIED WITH: PHARMD AHN PHAM [id number] AT 1821 BY EC    Staphylococcus aureus (BCID) NOT DETECTED NOT DETECTED Final   Staphylococcus epidermidis DETECTED (A) NOT DETECTED Final    Comment: CRITICAL RESULT CALLED TO, READ BACK BY AND VERIFIED WITH: PHARMD AHN PHAM [id number] AT 1821 BY EC    Staphylococcus lugdunensis NOT DETECTED NOT DETECTED Final   Streptococcus species NOT DETECTED NOT DETECTED Final   Streptococcus agalactiae NOT DETECTED NOT DETECTED Final   Streptococcus pneumoniae NOT DETECTED NOT DETECTED Final   Streptococcus pyogenes NOT DETECTED NOT DETECTED Final   A.calcoaceticus-baumannii NOT DETECTED NOT DETECTED Final   Bacteroides fragilis NOT DETECTED NOT DETECTED Final   Enterobacterales NOT DETECTED NOT DETECTED Final   Enterobacter cloacae complex NOT DETECTED NOT DETECTED Final   Escherichia coli NOT DETECTED NOT DETECTED Final   Klebsiella aerogenes NOT DETECTED NOT DETECTED Final   Klebsiella oxytoca NOT DETECTED NOT DETECTED Final   Klebsiella pneumoniae NOT DETECTED NOT DETECTED Final   Proteus species NOT DETECTED  NOT DETECTED Final   Salmonella species NOT DETECTED NOT DETECTED Final   Serratia marcescens NOT DETECTED NOT DETECTED Final   Haemophilus influenzae NOT DETECTED NOT DETECTED Final   Neisseria meningitidis NOT DETECTED NOT DETECTED Final   Pseudomonas aeruginosa NOT DETECTED NOT DETECTED Final   Stenotrophomonas maltophilia NOT DETECTED NOT DETECTED Final   Candida albicans NOT DETECTED NOT DETECTED Final   Candida auris NOT DETECTED NOT DETECTED Final   Candida glabrata NOT DETECTED NOT DETECTED Final   Candida krusei NOT DETECTED NOT DETECTED Final   Candida parapsilosis NOT DETECTED NOT DETECTED Final   Candida tropicalis NOT DETECTED NOT DETECTED Final   Cryptococcus neoformans/gattii NOT DETECTED NOT DETECTED Final   Methicillin resistance mecA/C NOT DETECTED NOT DETECTED Final    Comment: Performed at [facility], [address] [place], [place] [address]  Culture, blood (Routine X 2) w Reflex to ID Panel     Status: None (Preliminary result)   Collection Time: 04/17/24  1:04 PM   Specimen: BLOOD LEFT HAND  Result Value Ref Range Status   Specimen Description   Final    BLOOD LEFT HAND Performed at [facility], [address] [place], [place] [address]    Special Requests   Final    BOTTLES DRAWN AEROBIC AND ANAEROBIC Blood Culture adequate volume Performed at [facility], [address] [place], [place] [address]    Culture   Final    NO GROWTH < 24 HOURS Performed at [facility], [address] [place], [place] [address]    Report Status PENDING  Incomplete  Culture, blood (Routine X 2) w Reflex to ID Panel     Status: None (Preliminary result)  Collection Time: 04/17/24  1:09 PM   Specimen: BLOOD LEFT HAND  Result Value Ref Range Status   Specimen Description   Final    BLOOD LEFT HAND Performed at [facility], [address] [place], [place] [address]    Special Requests   Final    BOTTLES DRAWN AEROBIC AND  ANAEROBIC Blood Culture adequate volume Performed at [facility], [address], [place], [place] [address]    Culture   Final    NO GROWTH < 24 HOURS Performed at [facility], [address] [place], [place] [address]    Report Status PENDING  Incomplete         Radiology Studies: No results found.       Scheduled Meds:  ARIPiprazole   10 mg Oral BID   carvedilol   12.5 mg Oral BID WC   Chlorhexidine  Gluconate Cloth  6 each Topical Daily   DULoxetine   60 mg Oral BID   levothyroxine   50 mcg Oral QAC breakfast   lidocaine   2 patch Transdermal Q24H   metoCLOPramide  (REGLAN ) injection  10 mg Intravenous Q8H   trazodone   600 mg Oral QHS   Continuous Infusions:  ampicillin -sulbactam (UNASYN ) IV 3 g (04/18/24 1106)     LOS: 2 days    Time spent: 50 minutes spent on 04/18/2024 caring for this patient face-to-face including chart review, ordering labs/tests, documenting, discussion with nursing staff, consultants, updating family and interview/physical exam    Camellia PARAS Starlin Steib, DO [facility] Available via Epic secure chat 7am-7pm After these hours, please refer to coverage provider listed on [email or web address] 04/18/2024, 12:27 PM   "

## 2024-04-19 DIAGNOSIS — G9341 Metabolic encephalopathy: Secondary | ICD-10-CM | POA: Diagnosis not present

## 2024-04-19 LAB — GLUCOSE, CAPILLARY
Glucose-Capillary: 110 mg/dL — ABNORMAL HIGH (ref 70–99)
Glucose-Capillary: 115 mg/dL — ABNORMAL HIGH (ref 70–99)
Glucose-Capillary: 117 mg/dL — ABNORMAL HIGH (ref 70–99)
Glucose-Capillary: 170 mg/dL — ABNORMAL HIGH (ref 70–99)
Glucose-Capillary: 90 mg/dL (ref 70–99)
Glucose-Capillary: 94 mg/dL (ref 70–99)

## 2024-04-19 LAB — BASIC METABOLIC PANEL WITH GFR
Anion gap: 9 (ref 5–15)
BUN: 6 mg/dL (ref 6–20)
CO2: 24 mmol/L (ref 22–32)
Calcium: 8.5 mg/dL — ABNORMAL LOW (ref 8.9–10.3)
Chloride: 107 mmol/L (ref 98–111)
Creatinine, Ser: 0.68 mg/dL (ref 0.44–1.00)
GFR, Estimated: >60 mL/min
Glucose, Bld: 93 mg/dL (ref 70–99)
Potassium: 4.2 mmol/L (ref 3.5–5.1)
Sodium: 140 mmol/L (ref 135–145)

## 2024-04-19 LAB — CBC
HCT: 29.6 % — ABNORMAL LOW (ref 36.0–46.0)
Hemoglobin: 9.4 g/dL — ABNORMAL LOW (ref 12.0–15.0)
MCH: 25.8 pg — ABNORMAL LOW (ref 26.0–34.0)
MCHC: 31.8 g/dL (ref 30.0–36.0)
MCV: 81.3 fL (ref 80.0–100.0)
Platelets: 233 K/uL (ref 150–400)
RBC: 3.64 MIL/uL — ABNORMAL LOW (ref 3.87–5.11)
RDW: 17.2 % — ABNORMAL HIGH (ref 11.5–15.5)
WBC: 11.2 K/uL — ABNORMAL HIGH (ref 4.0–10.5)
nRBC: 0 % (ref 0.0–0.2)

## 2024-04-19 LAB — MAGNESIUM: Magnesium: 2.3 mg/dL (ref 1.7–2.4)

## 2024-04-19 LAB — PHOSPHORUS: Phosphorus: 3.5 mg/dL (ref 2.5–4.6)

## 2024-04-19 NOTE — Plan of Care (Signed)
 Cont with plan of care. Discussion with pt on mobility and ambulating in the halls today

## 2024-04-19 NOTE — Progress Notes (Signed)
 " PROGRESS NOTE    [name]  FMW:[id number] DOB: 1971/04/02 DOA: 04/14/2024 PCP: [name], PA-C    Brief Narrative:  [name] is a 53 y.o. female with past medical history significant for HTN, HLD, CAD, DM2, hypothyroidism, anxiety/depression, chronic low back pain/cervical DDD, headache, history of cardiac arrest, skin cancer, drug-seeking behavior who presented to [facility] ED on 04/14/2024 from home with confusion, nausea and vomiting.  Patient unable to participate due to her confusion.  Only oriented to name despite more than 13 hours since arrival.  Denied headache, no chest pain, no back pain, no abdominal pain.  In the ED, temperature 99.6 F, HR 110, RR 18, BP 155/135, SpO2 100% on room air.  WBC 20.5, hemoglobin 13.2, plate count 572.  Sodium 139, potassium 3.9, chloride 103, CO2 21, glucose 140, BUN 9, creatinine 0.80.  AST 17, ALT 8, total bilirubin 0.5.  Ammonia level 22.  Lactic acid 1.5.  Acetaminophen  level less than 10.  Salicylate level less than 7.0.  COVID/influenza/RSV PCR negative.  Urinalysis unrevealing.  UDS positive for amphetamines.  Chest x-ray with no acute chest findings.  CT head without contrast with no acute intracranial abnormality.  Blood cultures and urine culture obtained.  The patient received ondansetron  4 mg IVP x 2 and 1000 mL of normal saline bolus. TRH consulted for admission for further evaluation and management of acute metabolic/toxic encephalopathy.  Patient was found to have Acinetobacter and baumannii bacteremia.  Plan to complete 7 days of IV antibiotics  Assessment & Plan: Acute metabolic/toxic encephalopathy, POA UDS positive for amphetamines -Patient presenting with confusion, unable to participate in HPI.  Unclear etiology.  Patient is afebrile but with elevated WBC count of 20.5.  Ammonia level within normal limits.  Lactic acid normal.  Acetaminophen  and salicylate level normal.  COVID/influenza/RSV PCR  negative.  Urinalysis negative.  -CT head unrevealing.  UDS is positive for amphetamines, review of EMR does not show medical prescription to suggest prescribed this at baseline.  Additionally patient with positive blood cultures as below. -- Supportive care, treatment as below  Acinetobacter calcoaceticus/Baumannii Bacteremia --Unclear etiology.  Urinalysis/urine culture negative. -- Blood cultures 2 out of 4 + for Acinetobacter calcoaceticus/Baumannii. 1 out of 3 positive for GPC's in clusters with BC ID staph epi, likely contaminant. (No oral option per PharmD) -- Repeat blood cultures 12/26: No growth  -- Unasyn  3 g IV every 6 hours; likely will need 7 days of treatment  Intractable nausea/vomiting: Resolved Patient continues with recurrent intractable nausea and vomiting.  Lipase within normal limits.  Unclear etiology but likely complicated by Mounjaro  use.  CT abdomen/pelvis with contrast 12/25 with mild limitations but no acute findings noted. -- Antiemetics, Zofran /Compazine  as needed -- Tolerating diet  Urinary retention Patient continues to appear to have abdominal discomfort.  Overnight In-N-Out cath with greater than 800 mL urine removed.  Continues to look uncomfortable. -- Foley catheter placed 12/25; will discontinue for voiding trial 12/27 -- She has been able to urinate  Hypokalemia Hypomagnesemia Hypophosphatemia Repleted.  HTN --Continue with  carvedilol   -- Hydralazine  20 mg IV q4h PRN  -- Labetalol  10 mg IV q2h PRN  HLD CAD -- Hold home atorvastatin  for now   DM2 Hemoglobin A1c 5.8% on 10/24/2023.  Hypothyroidism TSH 0.62, within normal limits. -- On Levothyroxine   PTSD Bipolar disorder Anxiety/depression On Abilify  10mg  POP BID, clonazepam  0.5mg  PO daily PRN, Cymbalta  60 mg PO BID, prazosin  10mg  PO at bedtime, Seroquel   50 mg PO qHS at baseline   Chronic low back pain/cervical DDD Oxycodone , Cymbalta , tizanidine , pregabalin  outpatient.  Obesity,  class I Body mass index is 32.61 kg/m.   DVT prophylaxis: SCDs Start: 04/15/24 0758    Code Status: Full Code Family Communication: No family present at bedside  Disposition Plan:  Level of care: Progressive Status is: Inpatient Remains inpatient appropriate because: IV antibiotics, advancing diet today, removing Foley catheter for voiding trial    Consultants:  None  Procedures:  None  Antimicrobials:  Unasyn  12/24>>   Subjective: Patient is alert she is oriented to person and place.  No new complaints.  She reports she has been able to tolerate diet.  she has been able to urinate  Objective: Vitals:   04/18/24 1336 04/18/24 2022 04/19/24 0020 04/19/24 0506  BP: 130/87 91/66 (!) 142/74 116/88  Pulse: 85 (!) 59  79  Resp: 20 16  18   Temp: 98.2 F (36.8 C) 97.9 F (36.6 C)  98 F (36.7 C)  TempSrc: Oral Oral  Oral  SpO2: 99% 96%  99%  Weight:      Height:        Intake/Output Summary (Last 24 hours) at 04/19/2024 9177 Last data filed at 04/19/2024 0016 Gross per 24 hour  Intake 717 ml  Output 450 ml  Net 267 ml   Filed Weights   04/14/24 2221  Weight: 86.2 kg    Examination:  Physical Exam: GEN: NAD PULM: CTA CV:  S 1, S 2 RRR GI: BS present, soft, nt MSK: no edema   Data Reviewed: I have personally reviewed following labs and imaging studies  CBC: Recent Labs  Lab 04/15/24 0801 04/16/24 1109 04/17/24 0504 04/18/24 0528 04/19/24 0605  WBC 17.2* 19.2* 16.5* 8.6 11.2*  HGB 10.9* 11.4* 10.6* 9.1* 9.4*  HCT 35.4* 36.2 33.4* 29.2* 29.6*  MCV 82.1 79.6* 80.3 81.1 81.3  PLT 341 360 338 251 233   Basic Metabolic Panel: Recent Labs  Lab 04/15/24 0801 04/15/24 1654 04/16/24 1109 04/17/24 0504 04/18/24 0528 04/19/24 0605  NA 141  --  135 137 138 140  K 3.2*  --  3.1* 3.6 3.8 4.2  CL 104  --  100 102 105 107  CO2 23  --  20* 21* 19* 24  GLUCOSE 126*  --  121* 130* 104* 93  BUN 9  --  7 <5* 6 6  CREATININE 0.77  --  0.61 0.59 0.62  0.68  CALCIUM  9.6  --  9.1 8.8* 8.6* 8.5*  MG 2.2  --  1.8 2.2 2.1 2.3  PHOS 2.3* 1.9* 2.2* 1.8* 2.9 3.5   GFR: Estimated Creatinine Clearance: 86.4 mL/min (by C-G formula based on SCr of 0.68 mg/dL). Liver Function Tests: Recent Labs  Lab 04/14/24 2230 04/15/24 0801 04/17/24 0504 04/18/24 0528  AST 17 14* 12* 10*  ALT 8 8 9 7   ALKPHOS 121 102 92 62  BILITOT 0.5 0.5 0.5 0.4  PROT 9.5* 8.1 7.4 6.2*  ALBUMIN  4.2 3.8 3.4* 3.0*   Recent Labs  Lab 04/17/24 0504  LIPASE 26   Recent Labs  Lab 04/15/24 0129  AMMONIA 22   Coagulation Profile: No results for input(s): INR, PROTIME in the last 168 hours. Cardiac Enzymes: No results for input(s): CKTOTAL, CKMB, CKMBINDEX, TROPONINI in the last 168 hours. BNP (last 3 results) No results for input(s): PROBNP in the last 8760 hours. HbA1C: No results for input(s): HGBA1C in the last 72 hours. CBG: Recent  Labs  Lab 04/18/24 1718 04/18/24 2019 04/19/24 0017 04/19/24 0332 04/19/24 0744  GLUCAP 84 135* 115* 117* 90   Lipid Profile: No results for input(s): CHOL, HDL, LDLCALC, TRIG, CHOLHDL, LDLDIRECT in the last 72 hours. Thyroid  Function Tests: Recent Labs    04/16/24 1109  TSH 0.622  FREET4 1.15   Anemia Panel: No results for input(s): VITAMINB12, FOLATE, FERRITIN, TIBC, IRON , RETICCTPCT in the last 72 hours. Sepsis Labs: Recent Labs  Lab 04/15/24 0150  LATICACIDVEN 1.5    Recent Results (from the past 240 hours)  Resp panel by RT-PCR (RSV, Flu A&B, Covid) Anterior Nasal Swab     Status: None   Collection Time: 04/15/24 12:28 AM   Specimen: Anterior Nasal Swab  Result Value Ref Range Status   SARS Coronavirus 2 by RT PCR NEGATIVE NEGATIVE Final    Comment: (NOTE) SARS-CoV-2 target nucleic acids are NOT DETECTED.  The SARS-CoV-2 RNA is generally detectable in upper respiratory specimens during the acute phase of infection. The lowest concentration of SARS-CoV-2 viral  copies this assay can detect is 138 copies/mL. A negative result does not preclude SARS-Cov-2 infection and should not be used as the sole basis for treatment or other patient management decisions. A negative result may occur with  improper specimen collection/handling, submission of specimen other than nasopharyngeal swab, presence of viral mutation(s) within the areas targeted by this assay, and inadequate number of viral copies(<138 copies/mL). A negative result must be combined with clinical observations, patient history, and epidemiological information. The expected result is Negative.  Fact Sheet for Patients:  bloggercourse.com  Fact Sheet for Healthcare Providers:  seriousbroker.it  This test is no t yet approved or cleared by the [place]  FDA and  has been authorized for detection and/or diagnosis of SARS-CoV-2 by FDA under an Emergency Use Authorization (EUA). This EUA will remain  in effect (meaning this test can be used) for the duration of the COVID-19 declaration under Section 564(b)(1) of the Act, 21 U.S.C.section 360bbb-3(b)(1), unless the authorization is terminated  or revoked sooner.       Influenza A by PCR NEGATIVE NEGATIVE Final   Influenza B by PCR NEGATIVE NEGATIVE Final    Comment: (NOTE) The Xpert Xpress SARS-CoV-2/FLU/RSV plus assay is intended as an aid in the diagnosis of influenza from Nasopharyngeal swab specimens and should not be used as a sole basis for treatment. Nasal washings and aspirates are unacceptable for Xpert Xpress SARS-CoV-2/FLU/RSV testing.  Fact Sheet for Patients: bloggercourse.com  Fact Sheet for Healthcare Providers: seriousbroker.it  This test is not yet approved or cleared by the [place]  FDA and has been authorized for detection and/or diagnosis of SARS-CoV-2 by FDA under an Emergency Use Authorization (EUA). This  EUA will remain in effect (meaning this test can be used) for the duration of the COVID-19 declaration under Section 564(b)(1) of the Act, 21 U.S.C. section 360bbb-3(b)(1), unless the authorization is terminated or revoked.     Resp Syncytial Virus by PCR NEGATIVE NEGATIVE Final    Comment: (NOTE) Fact Sheet for Patients: bloggercourse.com  Fact Sheet for Healthcare Providers: seriousbroker.it  This test is not yet approved or cleared by the [place]  FDA and has been authorized for detection and/or diagnosis of SARS-CoV-2 by FDA under an Emergency Use Authorization (EUA). This EUA will remain in effect (meaning this test can be used) for the duration of the COVID-19 declaration under Section 564(b)(1) of the Act, 21 U.S.C. section 360bbb-3(b)(1), unless the authorization is terminated or revoked.  Performed at [facility], [address] [place], [place] [address]   Blood culture (routine x 2)     Status: Abnormal   Collection Time: 04/15/24  1:58 AM   Specimen: BLOOD  Result Value Ref Range Status   Specimen Description   Final    BLOOD SITE NOT SPECIFIED Performed at [facility], [address], [place], [place] [address]    Special Requests   Final    BOTTLES DRAWN AEROBIC AND ANAEROBIC Blood Culture adequate volume Performed at [facility], [address] [place], [place] [address]    Culture  Setup Time   Final    GRAM NEGATIVE RODS IN BOTH AEROBIC AND ANAEROBIC BOTTLES CRITICAL RESULT CALLED TO, READ BACK BY AND VERIFIED WITH: PHARMD E. JACKSON [id number]@2105FH  Performed at [facility], [address], [place], [place] [address]    Culture ACINETOBACTER CALCOACETICUS/BAUMANNII COMPLEX (A)  Final   Report Status 04/17/2024 FINAL  Final   Organism ID, Bacteria ACINETOBACTER CALCOACETICUS/BAUMANNII COMPLEX  Final      Susceptibility   Acinetobacter  calcoaceticus/baumannii complex - MIC*    MINOCYCLINE <=0.5 SENSITIVE Sensitive     IMIPENEM <=0.5 SENSITIVE Sensitive     PIP/TAZO Value in next row Sensitive      <=4 SENSITIVEThis is a modified FDA-approved test that has been validated and its performance characteristics determined by the reporting laboratory.  This laboratory is certified under the Clinical Laboratory Improvement Amendments CLIA as qualified to perform high complexity clinical laboratory testing.    AMPICILLIN /SULBACTAM Value in next row Sensitive      <=4 SENSITIVEThis is a modified FDA-approved test that has been validated and its performance characteristics determined by the reporting laboratory.  This laboratory is certified under the Clinical Laboratory Improvement Amendments CLIA as qualified to perform high complexity clinical laboratory testing.    MEROPENEM Value in next row Sensitive      <=4 SENSITIVEThis is a modified FDA-approved test that has been validated and its performance characteristics determined by the reporting laboratory.  This laboratory is certified under the Clinical Laboratory Improvement Amendments CLIA as qualified to perform high complexity clinical laboratory testing.    * ACINETOBACTER CALCOACETICUS/BAUMANNII COMPLEX  Blood Culture ID Panel (Reflexed)     Status: Abnormal   Collection Time: 04/15/24  1:58 AM  Result Value Ref Range Status   Enterococcus faecalis NOT DETECTED NOT DETECTED Final   Enterococcus Faecium NOT DETECTED NOT DETECTED Final   Listeria monocytogenes NOT DETECTED NOT DETECTED Final   Staphylococcus species NOT DETECTED NOT DETECTED Final   Staphylococcus aureus (BCID) NOT DETECTED NOT DETECTED Final   Staphylococcus epidermidis NOT DETECTED NOT DETECTED Final   Staphylococcus lugdunensis NOT DETECTED NOT DETECTED Final   Streptococcus species NOT DETECTED NOT DETECTED Final   Streptococcus agalactiae NOT DETECTED NOT DETECTED Final   Streptococcus pneumoniae NOT  DETECTED NOT DETECTED Final   Streptococcus pyogenes NOT DETECTED NOT DETECTED Final   A.calcoaceticus-baumannii DETECTED (A) NOT DETECTED Final    Comment: CRITICAL RESULT CALLED TO, READ BACK BY AND VERIFIED WITH: PHARMD E. JACKSON [id number]@2105FH     Bacteroides fragilis NOT DETECTED NOT DETECTED Final   Enterobacterales NOT DETECTED NOT DETECTED Final   Enterobacter cloacae complex NOT DETECTED NOT DETECTED Final   Escherichia coli NOT DETECTED NOT DETECTED Final   Klebsiella aerogenes NOT DETECTED NOT DETECTED Final   Klebsiella oxytoca NOT DETECTED NOT DETECTED Final   Klebsiella pneumoniae NOT DETECTED NOT DETECTED Final  Proteus species NOT DETECTED NOT DETECTED Final   Salmonella species NOT DETECTED NOT DETECTED Final   Serratia marcescens NOT DETECTED NOT DETECTED Final   Haemophilus influenzae NOT DETECTED NOT DETECTED Final   Neisseria meningitidis NOT DETECTED NOT DETECTED Final   Pseudomonas aeruginosa NOT DETECTED NOT DETECTED Final   Stenotrophomonas maltophilia NOT DETECTED NOT DETECTED Final   Candida albicans NOT DETECTED NOT DETECTED Final   Candida auris NOT DETECTED NOT DETECTED Final   Candida glabrata NOT DETECTED NOT DETECTED Final   Candida krusei NOT DETECTED NOT DETECTED Final   Candida parapsilosis NOT DETECTED NOT DETECTED Final   Candida tropicalis NOT DETECTED NOT DETECTED Final   Cryptococcus neoformans/gattii NOT DETECTED NOT DETECTED Final   CTX-M ESBL NOT DETECTED NOT DETECTED Final   Carbapenem resistance IMP NOT DETECTED NOT DETECTED Final   Carbapenem resistance KPC NOT DETECTED NOT DETECTED Final   Carbapenem resistance NDM NOT DETECTED NOT DETECTED Final   Carbapenem resistance VIM NOT DETECTED NOT DETECTED Final    Comment: Performed at [facility], [address] [place], [place] [address]  Urine Culture     Status: None   Collection Time: 04/15/24  2:11 AM   Specimen: Urine, Clean Catch  Result Value Ref Range Status   Specimen  Description   Final    URINE, CLEAN CATCH Performed at [facility], [address] [place], [place] [address]    Special Requests   Final    NONE Performed at [facility], [address] [place], [place] [address]    Culture   Final    NO GROWTH Performed at [facility], [address] [place], [place] [address]    Report Status 04/16/2024 FINAL  Final  Culture, blood (Routine X 2) w Reflex to ID Panel     Status: Abnormal   Collection Time: 04/15/24  4:54 PM   Specimen: BLOOD LEFT HAND  Result Value Ref Range Status   Specimen Description   Final    BLOOD LEFT HAND Performed at [facility], [address], [place], [place] [address]    Special Requests   Final    BOTTLES DRAWN AEROBIC ONLY Blood Culture results may not be optimal due to an inadequate volume of blood received in culture bottles Performed at [facility], [address] [place], [place] [address]    Culture  Setup Time   Final    GRAM POSITIVE COCCI IN CLUSTERS AEROBIC BOTTLE ONLY CRITICAL RESULT CALLED TO, READ BACK BY AND VERIFIED WITH: PHARMD AHN PHAM [id number] AT 1821 BY EC    Culture (A)  Final    STAPHYLOCOCCUS HOMINIS THE SIGNIFICANCE OF ISOLATING THIS ORGANISM FROM A SINGLE SET OF BLOOD CULTURES WHEN MULTIPLE SETS ARE DRAWN IS UNCERTAIN. PLEASE NOTIFY THE MICROBIOLOGY DEPARTMENT WITHIN ONE WEEK IF SPECIATION AND SENSITIVITIES ARE REQUIRED. Performed at [facility], [address] [place], [place] [address]    Report Status 04/17/2024 FINAL  Final  Blood Culture ID Panel (Reflexed)     Status: Abnormal   Collection Time: 04/15/24  4:54 PM  Result Value Ref Range Status   Enterococcus faecalis NOT DETECTED NOT DETECTED Final   Enterococcus Faecium NOT DETECTED NOT DETECTED Final   Listeria monocytogenes NOT DETECTED NOT DETECTED Final   Staphylococcus species DETECTED (A) NOT DETECTED Final    Comment: CRITICAL RESULT  CALLED TO, READ BACK BY AND VERIFIED WITH: PHARMD AHN  PHAM [id number] AT 1821 BY EC    Staphylococcus aureus (BCID) NOT DETECTED NOT DETECTED Final   Staphylococcus epidermidis DETECTED (A) NOT DETECTED Final    Comment: CRITICAL RESULT CALLED TO, READ BACK BY AND VERIFIED WITH: PHARMD AHN PHAM [id number] AT 1821 BY EC    Staphylococcus lugdunensis NOT DETECTED NOT DETECTED Final   Streptococcus species NOT DETECTED NOT DETECTED Final   Streptococcus agalactiae NOT DETECTED NOT DETECTED Final   Streptococcus pneumoniae NOT DETECTED NOT DETECTED Final   Streptococcus pyogenes NOT DETECTED NOT DETECTED Final   A.calcoaceticus-baumannii NOT DETECTED NOT DETECTED Final   Bacteroides fragilis NOT DETECTED NOT DETECTED Final   Enterobacterales NOT DETECTED NOT DETECTED Final   Enterobacter cloacae complex NOT DETECTED NOT DETECTED Final   Escherichia coli NOT DETECTED NOT DETECTED Final   Klebsiella aerogenes NOT DETECTED NOT DETECTED Final   Klebsiella oxytoca NOT DETECTED NOT DETECTED Final   Klebsiella pneumoniae NOT DETECTED NOT DETECTED Final   Proteus species NOT DETECTED NOT DETECTED Final   Salmonella species NOT DETECTED NOT DETECTED Final   Serratia marcescens NOT DETECTED NOT DETECTED Final   Haemophilus influenzae NOT DETECTED NOT DETECTED Final   Neisseria meningitidis NOT DETECTED NOT DETECTED Final   Pseudomonas aeruginosa NOT DETECTED NOT DETECTED Final   Stenotrophomonas maltophilia NOT DETECTED NOT DETECTED Final   Candida albicans NOT DETECTED NOT DETECTED Final   Candida auris NOT DETECTED NOT DETECTED Final   Candida glabrata NOT DETECTED NOT DETECTED Final   Candida krusei NOT DETECTED NOT DETECTED Final   Candida parapsilosis NOT DETECTED NOT DETECTED Final   Candida tropicalis NOT DETECTED NOT DETECTED Final   Cryptococcus neoformans/gattii NOT DETECTED NOT DETECTED Final   Methicillin resistance mecA/C NOT DETECTED NOT DETECTED Final    Comment: Performed at [facility], [address] [place], [place] [address]  Culture, blood (Routine X 2) w Reflex to ID Panel     Status: None (Preliminary result)   Collection Time: 04/17/24  1:04 PM   Specimen: BLOOD LEFT HAND  Result Value Ref Range Status   Specimen Description   Final    BLOOD LEFT HAND Performed at [facility], [address] [place], [place] [address]    Special Requests   Final    BOTTLES DRAWN AEROBIC AND ANAEROBIC Blood Culture adequate volume Performed at [facility], [address], [place], [place] [address]    Culture   Final    NO GROWTH < 24 HOURS Performed at [facility], [address], [place], [place] [address]    Report Status PENDING  Incomplete  Culture, blood (Routine X 2) w Reflex to ID Panel     Status: None (Preliminary result)   Collection Time: 04/17/24  1:09 PM   Specimen: BLOOD LEFT HAND  Result Value Ref Range Status   Specimen Description   Final    BLOOD LEFT HAND Performed at [facility], [address], [place], [place] [address]    Special Requests   Final    BOTTLES DRAWN AEROBIC AND ANAEROBIC Blood Culture adequate volume Performed at [facility], [address], [place], [place] [address]    Culture   Final    NO GROWTH < 24 HOURS Performed at [facility], [address] [place], [place] [address]    Report Status PENDING  Incomplete         Radiology Studies: No results found.  Scheduled Meds:  ARIPiprazole   10 mg Oral BID   carvedilol   12.5 mg Oral BID WC   Chlorhexidine  Gluconate Cloth  6 each Topical Daily   DULoxetine   60 mg Oral BID   levothyroxine   50 mcg Oral QAC breakfast   lidocaine   2 patch Transdermal Q24H   trazodone   600 mg Oral QHS   Continuous Infusions:  ampicillin -sulbactam (UNASYN ) IV 3 g (04/19/24 0332)     LOS: 3 days    Time spent: 50 minutes spent on 04/19/2024 caring for this patient face-to-face including chart  review, ordering labs/tests, documenting, discussion with nursing staff, consultants, updating family and interview/physical exam    Chanteria Haggard A Giuseppina Quinones, DO [facility] Available via Epic secure chat 7am-7pm After these hours, please refer to coverage provider listed on [email or web address] 04/19/2024, 8:22 AM   "

## 2024-04-20 ENCOUNTER — Other Ambulatory Visit (HOSPITAL_COMMUNITY): Payer: Self-pay

## 2024-04-20 DIAGNOSIS — G9341 Metabolic encephalopathy: Secondary | ICD-10-CM | POA: Diagnosis not present

## 2024-04-20 LAB — CBC
HCT: 32.4 % — ABNORMAL LOW (ref 36.0–46.0)
Hemoglobin: 10.3 g/dL — ABNORMAL LOW (ref 12.0–15.0)
MCH: 25.9 pg — ABNORMAL LOW (ref 26.0–34.0)
MCHC: 31.8 g/dL (ref 30.0–36.0)
MCV: 81.4 fL (ref 80.0–100.0)
Platelets: 262 K/uL (ref 150–400)
RBC: 3.98 MIL/uL (ref 3.87–5.11)
RDW: 17.3 % — ABNORMAL HIGH (ref 11.5–15.5)
WBC: 9.4 K/uL (ref 4.0–10.5)
nRBC: 0 % (ref 0.0–0.2)

## 2024-04-20 LAB — CULTURE, BLOOD (ROUTINE X 2): Special Requests: ADEQUATE

## 2024-04-20 LAB — GLUCOSE, CAPILLARY: Glucose-Capillary: 96 mg/dL (ref 70–99)

## 2024-04-20 MED ORDER — TIZANIDINE HCL 4 MG PO TABS
4.0000 mg | ORAL_TABLET | Freq: Four times a day (QID) | ORAL | 0 refills | Status: AC | PRN
  Filled 2024-04-20 – 2024-04-21 (×2): qty 20, 5d supply, fill #0

## 2024-04-20 MED ORDER — LEVOFLOXACIN 750 MG PO TABS
750.0000 mg | ORAL_TABLET | Freq: Every day | ORAL | 0 refills | Status: AC
  Filled 2024-04-20: qty 7, 7d supply, fill #0

## 2024-04-20 NOTE — Progress Notes (Signed)
 Received pt to room 1308, pt stable with no c/o at this time. All questions and needs met.

## 2024-04-20 NOTE — Plan of Care (Signed)

## 2024-04-20 NOTE — Progress Notes (Signed)
" °   04/20/24 1240  TOC Brief Assessment  Insurance and Status Reviewed  Patient has primary care physician Yes  Home environment has been reviewed single family home  Prior level of function: independent  Prior/Current Home Services No current home services  Social Drivers of Health Review SDOH reviewed no interventions necessary  Readmission risk has been reviewed Yes  Transition of care needs no transition of care needs at this time    Signed: Barbarann Saltness, MSW, LCSW Clinical Social Worker Inpatient Care Management 04/20/2024 12:41 PM   "

## 2024-04-20 NOTE — Plan of Care (Signed)
  Problem: Activity: Goal: Risk for activity intolerance will decrease Outcome: Progressing   Problem: Nutrition: Goal: Adequate nutrition will be maintained Outcome: Progressing   Problem: Safety: Goal: Ability to remain free from injury will improve Outcome: Progressing   Problem: Pain Managment: Goal: General experience of comfort will improve and/or be controlled Outcome: Progressing

## 2024-04-20 NOTE — Discharge Summary (Signed)
 " Physician Discharge Summary   Patient: [name] MRN: [id number] DOB: [birth year]  Admit date:     04/14/2024  Discharge date: 04/20/2024  Discharge Physician: Owen DELENA Lore   PCP: [name], PA-C   Recommendations at discharge:    Follow up resolution of bacteremia.  Monitor for polypharmacy   Discharge Diagnoses: Principal Problem:   Acute metabolic encephalopathy Active Problems:   Chronic pain syndrome   Type 2 diabetes mellitus without complication, without long-term current use of insulin  (HCC)   Essential hypertension   Hypokalemia   Bipolar affective disorder (HCC)   Hypothyroidism   PTSD (post-traumatic stress disorder)   Iron  deficiency   History of depression   Valvular heart disease   Hyperlipidemia  Resolved Problems:   * No resolved hospital problems. *  Hospital Course: [name] is a 53 y.o. female with past medical history significant for HTN, HLD, CAD, DM2, hypothyroidism, anxiety/depression, chronic low back pain/cervical DDD, headache, history of cardiac arrest, skin cancer, drug-seeking behavior who presented to [facility] ED on 04/14/2024 from home with confusion, nausea and vomiting.  Patient unable to participate due to her confusion.  Only oriented to name despite more than 13 hours since arrival.  Denied headache, no chest pain, no back pain, no abdominal pain.   In the ED, temperature 99.6 F, HR 110, RR 18, BP 155/135, SpO2 100% on room air.  WBC 20.5, hemoglobin 13.2, plate count 572.  Sodium 139, potassium 3.9, chloride 103, CO2 21, glucose 140, BUN 9, creatinine 0.80.  AST 17, ALT 8, total bilirubin 0.5.  Ammonia level 22.  Lactic acid 1.5.  Acetaminophen  level less than 10.  Salicylate level less than 7.0.  COVID/influenza/RSV PCR negative.  Urinalysis unrevealing.  UDS positive for amphetamines.  Chest x-ray with no acute chest findings.  CT head without contrast with no acute intracranial abnormality.  Blood  cultures and urine culture obtained.  The patient received ondansetron  4 mg IVP x 2 and 1000 mL of normal saline bolus. TRH consulted for admission for further evaluation and management of acute metabolic/toxic encephalopathy.   Patient was found to have Acinetobacter and baumannii bacteremia.  Plan to complete 7 days of IV antibiotics  Assessment and Plan: No notes have been filed under this hospital service. Service: Hospitalist Acute metabolic/toxic encephalopathy, POA UDS positive for amphetamines -Patient presenting with confusion, unable to participate in HPI.  Unclear etiology.  Patient is afebrile but with elevated WBC count of 20.5.  Ammonia level within normal limits.  Lactic acid normal.  Acetaminophen  and salicylate level normal.  COVID/influenza/RSV PCR negative.  Urinalysis negative.  -CT head unrevealing.  UDS is positive for amphetamines, review of EMR does not show medical prescription to suggest prescribed this at baseline.  Additionally patient with positive blood cultures as below. -- Supportive care, treatment as below  back to baseline, alert and oriented, conversant.   Acinetobacter calcoaceticus/Baumannii Bacteremia --Unclear etiology.  Urinalysis/urine culture negative. -- Blood cultures 2 out of 4 + for Acinetobacter calcoaceticus/Baumannii. 1 out of 3 positive for GPC's in clusters with BC ID staph epi, likely contaminant.  -- Repeat blood cultures 12/26: No growth  -- Unasyn  3 g IV every 6 hours; likely will need 7 days of treatment Plan to discharge on Levaquin . Sensitive was available today. Discharge on 4 more days antibiotics.    Intractable nausea/vomiting: Resolved Patient continues with recurrent intractable nausea and vomiting.  Lipase within normal limits.  Unclear etiology but likely  complicated by Mounjaro  use.  CT abdomen/pelvis with contrast 12/25 with mild limitations but no acute findings noted. -- Antiemetics, Zofran /Compazine  as needed -- Tolerating  diet   Urinary retention Patient continues to appear to have abdominal discomfort.  Overnight In-N-Out cath with greater than 800 mL urine removed.  Continues to look uncomfortable. -- Foley catheter placed 12/25; will discontinue for voiding trial 12/27 -- She has been able to urinate   Hypokalemia Hypomagnesemia Hypophosphatemia Repleted.   HTN --Continue with  carvedilol   -- Hydralazine  20 mg IV q4h PRN  -- Labetalol  10 mg IV q2h PRN   HLD CAD -- Hold home atorvastatin  for now    DM2 Hemoglobin A1c 5.8% on 10/24/2023.   Hypothyroidism TSH 0.62, within normal limits. -- On Levothyroxine    PTSD Bipolar disorder Anxiety/depression On Abilify  10mg  POP BID, clonazepam  0.5mg  PO daily PRN, Cymbalta  60 mg PO BID, prazosin  10mg  PO at bedtime, Seroquel  50 mg PO qHS at baseline    Chronic low back pain/cervical DDD Oxycodone , Cymbalta , tizanidine , pregabalin  outpatient.   Obesity, class I Body mass index is 32.61 kg/m.        Consultants: None Procedures performed: none Disposition: Home Diet recommendation:  Carb modified diet DISCHARGE MEDICATION: Allergies as of 04/20/2024   No Known Allergies      Medication List     STOP taking these medications    Mounjaro  10 MG/0.5ML Pen Generic drug: tirzepatide    Mounjaro  12.5 MG/0.5ML Pen Generic drug: tirzepatide    Mounjaro  7.5 MG/0.5ML Pen Generic drug: tirzepatide    ondansetron  8 MG disintegrating tablet Commonly known as: ZOFRAN -ODT   promethazine  25 MG tablet Commonly known as: PHENERGAN    QUEtiapine  25 MG tablet Commonly known as: SEROQUEL    topiramate  50 MG tablet Commonly known as: Topamax        TAKE these medications    acetaminophen  650 MG CR tablet Commonly known as: TYLENOL  Take 1,300 mg by mouth every 8 (eight) hours as needed for pain.   ARIPiprazole  10 MG tablet Commonly known as: Abilify  Take 1 tablet (10 mg total) by mouth 2 (two) times daily. What changed: Another  medication with the same name was removed. Continue taking this medication, and follow the directions you see here.   atorvastatin  20 MG tablet Commonly known as: LIPITOR Take 1 tablet (20 mg total) by mouth daily.   carvedilol  12.5 MG tablet Commonly known as: COREG  Take 12.5 mg by mouth 2 (two) times daily with a meal. What changed: Another medication with the same name was removed. Continue taking this medication, and follow the directions you see here.   clonazePAM  0.5 MG tablet Commonly known as: KLONOPIN  Take 1 tablet (0.5 mg total) by mouth daily as needed for severe breakthrough panic/ anxiety. What changed: Another medication with the same name was removed. Continue taking this medication, and follow the directions you see here.   DULoxetine  60 MG capsule Commonly known as: CYMBALTA  Take 1 capsule (60 mg total) by mouth 2 (two) times daily. What changed: Another medication with the same name was removed. Continue taking this medication, and follow the directions you see here.   hydrocortisone  25 MG suppository Commonly known as: ANUSOL -HC Place 1 suppository (25 mg total) rectally at bedtime. What changed:  when to take this reasons to take this   hydrocortisone  cream 1 % Apply topically 2 (two) times daily. What changed:  how much to take when to take this reasons to take this   levofloxacin  750 MG tablet Commonly known as:  Levaquin  Take 1 tablet (750 mg total) by mouth daily for 4 days.   levothyroxine  50 MCG tablet Commonly known as: SYNTHROID  Take 50 mcg by mouth daily before breakfast.   metoCLOPramide  5 MG tablet Commonly known as: REGLAN  Take 1 tablet (5 mg total) by mouth every 8 (eight) hours as needed for nausea or vomiting.   multivitamin Tabs tablet Take 1 tablet by mouth at bedtime.   Nephro Vitamins 0.8 MG Tabs Take 1 tablet by mouth daily.   naloxone  4 MG/0.1ML Liqd nasal spray kit Commonly known as: Narcan  Place 1 spray into the nose as  needed.   oxyCODONE  15 MG immediate release tablet Commonly known as: ROXICODONE  Take 1 tablet (15 mg total) by mouth 4 (four) times daily as needed.   pantoprazole  40 MG tablet Commonly known as: PROTONIX  Take 1 tablet by mouth twice daily   polycarbophil 625 MG tablet Commonly known as: FIBERCON Take 1 tablet (625 mg total) by mouth daily.   polyethylene glycol powder 17 GM/SCOOP powder Commonly known as: GLYCOLAX /MIRALAX  Take 17 g by mouth daily. Dissolve 1 capful (17g) in 4-8 ounces of liquid and take by mouth daily. What changed:  when to take this reasons to take this additional instructions   prazosin  5 MG capsule Commonly known as: MINIPRESS  Take 2 capsules (10 mg total) by mouth at bedtime for nightmares. What changed: Another medication with the same name was removed. Continue taking this medication, and follow the directions you see here.   pregabalin  50 MG capsule Commonly known as: LYRICA  Take 1 capsule (50 mg total) by mouth 2 (two) times daily. What changed:  when to take this reasons to take this Another medication with the same name was removed. Continue taking this medication, and follow the directions you see here.   Prolia 60 MG/ML Sosy injection Generic drug: denosumab Inject 60 mg into the skin every 6 (six) months.   tiZANidine  4 MG tablet Commonly known as: ZANAFLEX  Take 1 tablet (4 mg total) by mouth every 6 (six) hours as needed for up to 5 days.   trazodone  300 MG tablet Commonly known as: DESYREL  Take 2 tablets (600 mg total) by mouth at bedtime. What changed: Another medication with the same name was removed. Continue taking this medication, and follow the directions you see here.   Vitamin D  (Ergocalciferol ) 1.25 MG (50000 UNIT) Caps capsule Commonly known as: DRISDOL  Take 1 capsule (50,000 Units total) by mouth every 7 (seven) days.        Follow-up Information     Schlappi, Kathryn E, PA-C Follow up in 1 week(s).   Specialty:  Physician Assistant Contact information: [address] [place] [address] [phone]                Discharge Exam: Fredricka Weights   04/14/24 2221  Weight: 86.2 kg   General; NAD  Condition at discharge: stable  The results of significant diagnostics from this hospitalization (including imaging, microbiology, ancillary and laboratory) are listed below for reference.   Imaging Studies: CT ABDOMEN PELVIS W CONTRAST Result Date: 04/16/2024 EXAM: CT ABDOMEN AND PELVIS WITH CONTRAST 04/16/2024 11:37:53 AM TECHNIQUE: CT of the abdomen and pelvis was performed with the administration of 100 mL iohexol  (OMNIPAQUE ) 300 MG/ML solution. Multiplanar reformatted images are provided for review. Automated exposure control, iterative reconstruction, and/or weight-based adjustment of the mA/kV was utilized to reduce the radiation dose to as low as reasonably achievable. COMPARISON: 02/20/2024. CLINICAL HISTORY: Abdominal  pain, acute, nonlocalized. FINDINGS: Mild multifactorial degradation, including left arm position not raised above the head. Thoracolumbar spine fixation hardware artifact. LOWER CHEST: Normal heart size. Right base scarring or subsegmental atelectasis. LIVER: Likely artifactual hypoattenuation at the hepatic dome on image 14/2, absent on the prior exam. GALLBLADDER AND BILE DUCTS: Gallbladder is unremarkable. No biliary ductal dilatation. SPLEEN: Normal in size and morphology. PANCREAS: Normal, without duct dilatation or acute inflammation. ADRENAL GLANDS: Normal, without mass. KIDNEYS, URETERS AND BLADDER: No renal mass or hydronephrosis. Bladder collapse around a foley catheter. GI AND BOWEL: Normal stomach, without wall thickening. Normal small bowel caliber. Normal colon, terminal ileum, and appendix. PERITONEUM AND RETROPERITONEUM: No ascites. No free air. VASCULATURE: Abdominal aortic atherosclerosis. Aorta is normal in caliber. LYMPH NODES: No lymphadenopathy.  REPRODUCTIVE ORGANS: Normal uterus, without adnexal mass. BONES AND SOFT TISSUES: T10 through L2 transpedical screw fixation. Moderate-to-severe T12 compression deformity is unchanged. A mild superior endplate compression deformity at L4 and minimal superior endplate compression deformity at L5 are unchanged. No focal soft tissue abnormality. IMPRESSION: 1. Mild limitations as detailed above. 2. Given this factor, no acute finding or explanation for patient's symptoms. 3. Aortic atherosclerosis ([id number]). Electronically signed by: Rockey Kilts MD 04/16/2024 03:48 PM EST RP Workstation: [id number]   CT HEAD WO CONTRAST ( ) Result Date: 04/15/2024 EXAM: CT HEAD WITHOUT CONTRAST 04/15/2024 12:36:14 PM TECHNIQUE: CT of the head was performed without the administration of intravenous contrast. Automated exposure control, iterative reconstruction, and/or weight based adjustment of the mA/kV was utilized to reduce the radiation dose to as low as reasonably achievable. COMPARISON: Head CT 10/24/2023 and MRI 03/14/2018. CLINICAL HISTORY: Mental status change, unknown cause. Hypertension, nausea, and vomiting. FINDINGS: LIMITATIONS: The examination is mildly motion degraded despite repeat imaging. BRAIN AND VENTRICLES: There is no evidence of an acute infarct, intracranial hemorrhage, mass, midline shift, hydrocephalus, or extra-axial fluid collection. Cerebral volume is normal. ORBITS: No acute abnormality. SINUSES: No acute abnormality. SOFT TISSUES AND SKULL: No acute soft tissue abnormality. No skull fracture. IMPRESSION: 1. No acute intracranial abnormality. Electronically signed by: Dasie Hamburg MD 04/15/2024 12:40 PM EST RP Workstation: [id number]   DG Chest Port 1 View Result Date: 04/14/2024 CLINICAL DATA:  Altered mental status. EXAM: PORTABLE CHEST 1 VIEW COMPARISON:  11/21/2023 radiograph and CT FINDINGS: Chronic volume loss in the right hemithorax with elevated hemidiaphragm. Stable heart size and  mediastinal contours. No acute airspace disease, pleural effusion, or pneumothorax. No acute osseous findings. IMPRESSION: No acute chest findings. Chronic volume loss in the right hemithorax with elevated hemidiaphragm. Electronically Signed   By: Andrea Gasman M.D.   On: 04/14/2024 23:08    Microbiology: Results for orders placed or performed during the hospital encounter of 04/14/24  Resp panel by RT-PCR (RSV, Flu A&B, Covid) Anterior Nasal Swab     Status: None   Collection Time: 04/15/24 12:28 AM   Specimen: Anterior Nasal Swab  Result Value Ref Range Status   SARS Coronavirus 2 by RT PCR NEGATIVE NEGATIVE Final    Comment: (NOTE) SARS-CoV-2 target nucleic acids are NOT DETECTED.  The SARS-CoV-2 RNA is generally detectable in upper respiratory specimens during the acute phase of infection. The lowest concentration of SARS-CoV-2 viral copies this assay can detect is 138 copies/mL. A negative result does not preclude SARS-Cov-2 infection and should not be used as the sole basis for treatment or other patient management decisions. A negative result may occur with  improper specimen collection/handling, submission of specimen other than nasopharyngeal swab, presence of  viral mutation(s) within the areas targeted by this assay, and inadequate number of viral copies(<138 copies/mL). A negative result must be combined with clinical observations, patient history, and epidemiological information. The expected result is Negative.  Fact Sheet for Patients:  bloggercourse.com  Fact Sheet for Healthcare Providers:  seriousbroker.it  This test is no t yet approved or cleared by the [place]  FDA and  has been authorized for detection and/or diagnosis of SARS-CoV-2 by FDA under an Emergency Use Authorization (EUA). This EUA will remain  in effect (meaning this test can be used) for the duration of the COVID-19 declaration under Section  564(b)(1) of the Act, 21 U.S.C.section 360bbb-3(b)(1), unless the authorization is terminated  or revoked sooner.       Influenza A by PCR NEGATIVE NEGATIVE Final   Influenza B by PCR NEGATIVE NEGATIVE Final    Comment: (NOTE) The Xpert Xpress SARS-CoV-2/FLU/RSV plus assay is intended as an aid in the diagnosis of influenza from Nasopharyngeal swab specimens and should not be used as a sole basis for treatment. Nasal washings and aspirates are unacceptable for Xpert Xpress SARS-CoV-2/FLU/RSV testing.  Fact Sheet for Patients: bloggercourse.com  Fact Sheet for Healthcare Providers: seriousbroker.it  This test is not yet approved or cleared by the [place]  FDA and has been authorized for detection and/or diagnosis of SARS-CoV-2 by FDA under an Emergency Use Authorization (EUA). This EUA will remain in effect (meaning this test can be used) for the duration of the COVID-19 declaration under Section 564(b)(1) of the Act, 21 U.S.C. section 360bbb-3(b)(1), unless the authorization is terminated or revoked.     Resp Syncytial Virus by PCR NEGATIVE NEGATIVE Final    Comment: (NOTE) Fact Sheet for Patients: bloggercourse.com  Fact Sheet for Healthcare Providers: seriousbroker.it  This test is not yet approved or cleared by the [place]  FDA and has been authorized for detection and/or diagnosis of SARS-CoV-2 by FDA under an Emergency Use Authorization (EUA). This EUA will remain in effect (meaning this test can be used) for the duration of the COVID-19 declaration under Section 564(b)(1) of the Act, 21 U.S.C. section 360bbb-3(b)(1), unless the authorization is terminated or revoked.  Performed at [facility], [address], [place], [place] [address]   Blood culture (routine x 2)     Status: Abnormal   Collection Time: 04/15/24  1:58 AM   Specimen:  BLOOD  Result Value Ref Range Status   Specimen Description   Final    BLOOD SITE NOT SPECIFIED Performed at [facility], [address] [place], [place] [address]    Special Requests   Final    BOTTLES DRAWN AEROBIC AND ANAEROBIC Blood Culture adequate volume Performed at [facility], [address], [place], [place] [address]    Culture  Setup Time   Final    GRAM NEGATIVE RODS IN BOTH AEROBIC AND ANAEROBIC BOTTLES CRITICAL RESULT CALLED TO, READ BACK BY AND VERIFIED WITH: PHARMD E. JACKSON [id number]@[id number]  Performed at [facility], [address], [place], [place] [address]    Culture ACINETOBACTER CALCOACETICUS/BAUMANNII COMPLEX (A)  Final   Report Status 04/20/2024 FINAL  Final   Organism ID, Bacteria ACINETOBACTER CALCOACETICUS/BAUMANNII COMPLEX  Final      Susceptibility   Acinetobacter calcoaceticus/baumannii complex - MIC*    MINOCYCLINE <=0.5 SENSITIVE Sensitive     IMIPENEM <=0.5 SENSITIVE Sensitive     PIP/TAZO Value in next row Sensitive      <=4 SENSITIVEThis is a  modified FDA-approved test that has been validated and its performance characteristics determined by the reporting laboratory.  This laboratory is certified under the Clinical Laboratory Improvement Amendments CLIA as qualified to perform high complexity clinical laboratory testing.    AMPICILLIN /SULBACTAM Value in next row Sensitive      <=4 SENSITIVEThis is a modified FDA-approved test that has been validated and its performance characteristics determined by the reporting laboratory.  This laboratory is certified under the Clinical Laboratory Improvement Amendments CLIA as qualified to perform high complexity clinical laboratory testing.    MEROPENEM Value in next row Sensitive      <=4 SENSITIVEThis is a modified FDA-approved test that has been validated and its performance characteristics determined by the reporting laboratory.  This laboratory is certified under the  Clinical Laboratory Improvement Amendments CLIA as qualified to perform high complexity clinical laboratory testing.    TRIMETH /SULFA  Value in next row       <=20SENSITIVE    LEVOFLOXACIN  Value in next row       <=0.12SENSITIVE    * ACINETOBACTER CALCOACETICUS/BAUMANNII COMPLEX  Blood Culture ID Panel (Reflexed)     Status: Abnormal   Collection Time: 04/15/24  1:58 AM  Result Value Ref Range Status   Enterococcus faecalis NOT DETECTED NOT DETECTED Final   Enterococcus Faecium NOT DETECTED NOT DETECTED Final   Listeria monocytogenes NOT DETECTED NOT DETECTED Final   Staphylococcus species NOT DETECTED NOT DETECTED Final   Staphylococcus aureus (BCID) NOT DETECTED NOT DETECTED Final   Staphylococcus epidermidis NOT DETECTED NOT DETECTED Final   Staphylococcus lugdunensis NOT DETECTED NOT DETECTED Final   Streptococcus species NOT DETECTED NOT DETECTED Final   Streptococcus agalactiae NOT DETECTED NOT DETECTED Final   Streptococcus pneumoniae NOT DETECTED NOT DETECTED Final   Streptococcus pyogenes NOT DETECTED NOT DETECTED Final   A.calcoaceticus-baumannii DETECTED (A) NOT DETECTED Final    Comment: CRITICAL RESULT CALLED TO, READ BACK BY AND VERIFIED WITH: PHARMD E. JACKSON [id number]@[id number]     Bacteroides fragilis NOT DETECTED NOT DETECTED Final   Enterobacterales NOT DETECTED NOT DETECTED Final   Enterobacter cloacae complex NOT DETECTED NOT DETECTED Final   Escherichia coli NOT DETECTED NOT DETECTED Final   Klebsiella aerogenes NOT DETECTED NOT DETECTED Final   Klebsiella oxytoca NOT DETECTED NOT DETECTED Final   Klebsiella pneumoniae NOT DETECTED NOT DETECTED Final   Proteus species NOT DETECTED NOT DETECTED Final   Salmonella species NOT DETECTED NOT DETECTED Final   Serratia marcescens NOT DETECTED NOT DETECTED Final   Haemophilus influenzae NOT DETECTED NOT DETECTED Final   Neisseria meningitidis NOT DETECTED NOT DETECTED Final   Pseudomonas aeruginosa NOT DETECTED NOT DETECTED  Final   Stenotrophomonas maltophilia NOT DETECTED NOT DETECTED Final   Candida albicans NOT DETECTED NOT DETECTED Final   Candida auris NOT DETECTED NOT DETECTED Final   Candida glabrata NOT DETECTED NOT DETECTED Final   Candida krusei NOT DETECTED NOT DETECTED Final   Candida parapsilosis NOT DETECTED NOT DETECTED Final   Candida tropicalis NOT DETECTED NOT DETECTED Final   Cryptococcus neoformans/gattii NOT DETECTED NOT DETECTED Final   CTX-M ESBL NOT DETECTED NOT DETECTED Final   Carbapenem resistance IMP NOT DETECTED NOT DETECTED Final   Carbapenem resistance KPC NOT DETECTED NOT DETECTED Final   Carbapenem resistance NDM NOT DETECTED NOT DETECTED Final   Carbapenem resistance VIM NOT DETECTED NOT DETECTED Final    Comment: Performed at [facility], [address], [place], [place] [address]  Urine Culture  Status: None   Collection Time: 04/15/24  2:11 AM   Specimen: Urine, Clean Catch  Result Value Ref Range Status   Specimen Description   Final    URINE, CLEAN CATCH Performed at [facility], [address], [place], [place] [address]    Special Requests   Final    NONE Performed at [facility], [address], [place], [place] [address]    Culture   Final    NO GROWTH Performed at [facility], [address] [place], [place] [address]    Report Status 04/16/2024 FINAL  Final  Culture, blood (Routine X 2) w Reflex to ID Panel     Status: Abnormal   Collection Time: 04/15/24  4:54 PM   Specimen: BLOOD LEFT HAND  Result Value Ref Range Status   Specimen Description   Final    BLOOD LEFT HAND Performed at [facility], [address] [place], [place] [address]    Special Requests   Final    BOTTLES DRAWN AEROBIC ONLY Blood Culture results may not be optimal due to an inadequate volume of blood received in culture bottles Performed at [facility], [address] [place], [place]  [address]    Culture  Setup Time   Final    GRAM POSITIVE COCCI IN CLUSTERS AEROBIC BOTTLE ONLY CRITICAL RESULT CALLED TO, READ BACK BY AND VERIFIED WITH: PHARMD AHN PHAM [phone] AT 1821 BY EC    Culture (A)  Final    STAPHYLOCOCCUS HOMINIS THE SIGNIFICANCE OF ISOLATING THIS ORGANISM FROM A SINGLE SET OF BLOOD CULTURES WHEN MULTIPLE SETS ARE DRAWN IS UNCERTAIN. PLEASE NOTIFY THE MICROBIOLOGY DEPARTMENT WITHIN ONE WEEK IF SPECIATION AND SENSITIVITIES ARE REQUIRED. Performed at [facility], [address], [place], [place] [address]    Report Status 04/17/2024 FINAL  Final  Blood Culture ID Panel (Reflexed)     Status: Abnormal   Collection Time: 04/15/24  4:54 PM  Result Value Ref Range Status   Enterococcus faecalis NOT DETECTED NOT DETECTED Final   Enterococcus Faecium NOT DETECTED NOT DETECTED Final   Listeria monocytogenes NOT DETECTED NOT DETECTED Final   Staphylococcus species DETECTED (A) NOT DETECTED Final    Comment: CRITICAL RESULT CALLED TO, READ BACK BY AND VERIFIED WITH: PHARMD AHN PHAM [phone] AT 1821 BY EC    Staphylococcus aureus (BCID) NOT DETECTED NOT DETECTED Final   Staphylococcus epidermidis DETECTED (A) NOT DETECTED Final    Comment: CRITICAL RESULT CALLED TO, READ BACK BY AND VERIFIED WITH: PHARMD AHN PHAM [phone] AT 1821 BY EC    Staphylococcus lugdunensis NOT DETECTED NOT DETECTED Final   Streptococcus species NOT DETECTED NOT DETECTED Final   Streptococcus agalactiae NOT DETECTED NOT DETECTED Final   Streptococcus pneumoniae NOT DETECTED NOT DETECTED Final   Streptococcus pyogenes NOT DETECTED NOT DETECTED Final   A.calcoaceticus-baumannii NOT DETECTED NOT DETECTED Final   Bacteroides fragilis NOT DETECTED NOT DETECTED Final   Enterobacterales NOT DETECTED NOT DETECTED Final   Enterobacter cloacae complex NOT DETECTED NOT DETECTED Final   Escherichia coli NOT DETECTED NOT DETECTED Final   Klebsiella aerogenes NOT DETECTED NOT DETECTED Final    Klebsiella oxytoca NOT DETECTED NOT DETECTED Final   Klebsiella pneumoniae NOT DETECTED NOT DETECTED Final   Proteus species NOT DETECTED NOT DETECTED Final   Salmonella species NOT DETECTED NOT DETECTED Final   Serratia marcescens NOT DETECTED NOT DETECTED Final   Haemophilus influenzae NOT  DETECTED NOT DETECTED Final   Neisseria meningitidis NOT DETECTED NOT DETECTED Final   Pseudomonas aeruginosa NOT DETECTED NOT DETECTED Final   Stenotrophomonas maltophilia NOT DETECTED NOT DETECTED Final   Candida albicans NOT DETECTED NOT DETECTED Final   Candida auris NOT DETECTED NOT DETECTED Final   Candida glabrata NOT DETECTED NOT DETECTED Final   Candida krusei NOT DETECTED NOT DETECTED Final   Candida parapsilosis NOT DETECTED NOT DETECTED Final   Candida tropicalis NOT DETECTED NOT DETECTED Final   Cryptococcus neoformans/gattii NOT DETECTED NOT DETECTED Final   Methicillin resistance mecA/C NOT DETECTED NOT DETECTED Final    Comment: Performed at [facility], [address] [place], [place] [address]  Culture, blood (Routine X 2) w Reflex to ID Panel     Status: None (Preliminary result)   Collection Time: 04/17/24  1:04 PM   Specimen: BLOOD LEFT HAND  Result Value Ref Range Status   Specimen Description   Final    BLOOD LEFT HAND Performed at [facility], [address] [place], [place] [address]    Special Requests   Final    BOTTLES DRAWN AEROBIC AND ANAEROBIC Blood Culture adequate volume Performed at [facility], [address], [place], [place] [address]    Culture   Final    NO GROWTH 3 DAYS Performed at [facility], [address], [place], [place] [address]    Report Status PENDING  Incomplete  Culture, blood (Routine X 2) w Reflex to ID Panel     Status: None (Preliminary result)   Collection Time: 04/17/24  1:09 PM   Specimen: BLOOD LEFT HAND  Result Value Ref Range Status   Specimen Description   Final    BLOOD LEFT  HAND Performed at [facility], [address] [place], [place] [address]    Special Requests   Final    BOTTLES DRAWN AEROBIC AND ANAEROBIC Blood Culture adequate volume Performed at [facility], [address], [place], [place] [address]    Culture   Final    NO GROWTH 3 DAYS Performed at [facility], [address], [place], [place] [address]    Report Status PENDING  Incomplete    Labs: CBC: Recent Labs  Lab 04/16/24 1109 04/17/24 0504 04/18/24 0528 04/19/24 0605 04/20/24 0610  WBC 19.2* 16.5* 8.6 11.2* 9.4  HGB 11.4* 10.6* 9.1* 9.4* 10.3*  HCT 36.2 33.4* 29.2* 29.6* 32.4*  MCV 79.6* 80.3 81.1 81.3 81.4  PLT 360 338 251 233 262   Basic Metabolic Panel: Recent Labs  Lab 04/15/24 0801 04/15/24 1654 04/16/24 1109 04/17/24 0504 04/18/24 0528 04/19/24 0605  NA 141  --  135 137 138 140  K 3.2*  --  3.1* 3.6 3.8 4.2  CL 104  --  100 102 105 107  CO2 23  --  20* 21* 19* 24  GLUCOSE 126*  --  121* 130* 104* 93  BUN 9  --  7 <5* 6 6  CREATININE 0.77  --  0.61 0.59 0.62 0.68  CALCIUM  9.6  --  9.1 8.8* 8.6* 8.5*  MG 2.2  --  1.8 2.2 2.1 2.3  PHOS 2.3* 1.9* 2.2* 1.8* 2.9 3.5   Liver Function Tests: Recent Labs  Lab 04/14/24 2230 04/15/24 0801 04/17/24 0504 04/18/24 0528  AST 17 14* 12* 10*  ALT 8 8 9 7   ALKPHOS 121 102 92 62  BILITOT 0.5 0.5  0.5 0.4  PROT 9.5* 8.1 7.4 6.2*  ALBUMIN  4.2 3.8 3.4* 3.0*   CBG: Recent Labs  Lab 04/19/24 0744 04/19/24 1132 04/19/24 1608 04/19/24 2111 04/20/24 0742  GLUCAP 90 110* 170* 94 96    Discharge time spent: greater than 30 minutes.  Signed: Owen DELENA Lore, MD [facility] 04/20/2024 "

## 2024-04-20 NOTE — Evaluation (Signed)
 Occupational Therapy Evaluation/Discharge Patient Details Name: [name] MRN: [id number] DOB: [birth year] Today's Date: 04/20/2024   History of Present Illness   Pt is a 53 y/o female presenting with AMS and N&V in setting of acute metabolic encephalopathy. UDS+ amphetamines. +acinetobacter calcoaceticus/Baumannii Bacteremia. EFY:jwkpzub, osteoarthritis, anxiety, depression, GERD, Barret's esophagus, cervical DDD, chronic lower back pain, bulging lumbar disks, CAD, history of cardiac arrest, DM2,  headaches, hyperlipidemia, hypertension, hypothyroidism, skin cancer     Clinical Impressions PTA, pt lives with spouse and two adult children. Pt reports typically Modified Independent with ADLs/mobility in the home, as well as simple IADLs. Pt presents now at baseline for ADLs/in room mobility without need for AD. Reinforced fall prevention strategies and encouraged modification of daily routine to maximize overall independence. Pt denies concerns managing daily tasks at home. No further skilled OT services needed at this time but recommend pt work with mobility specialists while admitted.      If plan is discharge home, recommend the following:   Assistance with cooking/housework;Assist for transportation;Help with stairs or ramp for entrance     Functional Status Assessment   Patient has had a recent decline in their functional status and demonstrates the ability to make significant improvements in function in a reasonable and predictable amount of time.     Equipment Recommendations   None recommended by OT     Recommendations for Other Services         Precautions/Restrictions   Precautions Precautions: Fall Restrictions Weight Bearing Restrictions Per Provider Order: No     Mobility Bed Mobility Overal bed mobility: Modified Independent                  Transfers Overall transfer level: Independent Equipment used: None                General transfer comment: from bed and toilet      Balance Overall balance assessment: No apparent balance deficits (not formally assessed)                                         ADL either performed or assessed with clinical judgement   ADL Overall ADL's : At baseline                                       General ADL Comments: Pt able to manage LB dressing, bahroom mobility with IV pole, toileting task and hand hygiene without safety concerns. Discussed continued fall prevention strategies at home, modifications to allow improved ease for meal prep/cooking at home if this is a desired task     Vision Ability to See in Adequate Light: 0 Adequate Patient Visual Report: No change from baseline Vision Assessment?: No apparent visual deficits     Perception         Praxis         Pertinent Vitals/Pain Pain Assessment Pain Assessment: Faces Faces Pain Scale: Hurts a little bit Pain Location: back Pain Descriptors / Indicators: Sore Pain Intervention(s): Monitored during session     Extremity/Trunk Assessment Upper Extremity Assessment Upper Extremity Assessment: Overall WFL for tasks assessed;Right hand dominant   Lower Extremity Assessment Lower Extremity Assessment: Defer to PT evaluation   Cervical / Trunk Assessment Cervical / Trunk Assessment: Normal   Communication Communication Communication: No  apparent difficulties   Cognition Arousal: Alert Behavior During Therapy: WFL for tasks assessed/performed Cognition: No apparent impairments                               Following commands: Intact       Cueing  General Comments   Cueing Techniques: Verbal cues      Exercises     Shoulder Instructions      Home Living Family/patient expects to be discharged to:: Private residence Living Arrangements: Spouse/significant other Available Help at Discharge: Family;Available 24 hours/day Type of Home:  House Home Access: Stairs to enter [employer] of Steps: 3-4 Entrance Stairs-Rails: Right Home Layout: One level     Bathroom Shower/Tub: Chief Strategy Officer: Standard Bathroom Accessibility: Yes How Accessible: Accessible via walker Home Equipment: Rolling Walker (2 wheels);Shower seat;Toilet riser;BSC/3in1;Tub bench          Prior Functioning/Environment Prior Level of Function : Independent/Modified Independent             Mobility Comments: no device for mobility ADLs Comments: Mod I for ADLs, can do simple IADLs at home but spouse manages most of them. Does not drive. waits for family to be present before attempting shower for safety    OT Problem List: Pain   OT Treatment/Interventions:        OT Goals(Current goals can be found in the care plan section)   Acute Rehab OT Goals Patient Stated Goal: home soon, improvements in back pain OT Goal Formulation: All assessment and education complete, DC therapy   OT Frequency:       Co-evaluation              AM-PAC OT 6 Clicks Daily Activity     Outcome Measure Help from another person eating meals?: None Help from another person taking care of personal grooming?: None Help from another person toileting, which includes using toliet, bedpan, or urinal?: None Help from another person bathing (including washing, rinsing, drying)?: A Little Help from another person to put on and taking off regular upper body clothing?: None Help from another person to put on and taking off regular lower body clothing?: None 6 Click Score: 23   End of Session Nurse Communication: Other (comment) (discussed with NT)  Activity Tolerance: Patient tolerated treatment well Patient left: in bed;with call bell/phone within reach;with bed alarm set  OT Visit Diagnosis: Other abnormalities of gait and mobility (R26.89)                Time: 1001-1015 OT Time Calculation (min): 14 min Charges:  OT  General Charges $OT Visit: 1 Visit OT Evaluation $OT Eval Low Complexity: 1 Low  [name], OTR/L Acute Rehab Services Office: [phone]   [name] 04/20/2024, 10:52 AM

## 2024-04-20 NOTE — Discharge Instructions (Signed)
 You have new antibiotics to treat blood infection fro 4 more days.

## 2024-04-20 NOTE — Progress Notes (Signed)
 Discharge instructions given to patient questions asked and answered. Discharge medication delivered to patient at the bedside

## 2024-04-21 ENCOUNTER — Other Ambulatory Visit (HOSPITAL_COMMUNITY): Payer: Self-pay

## 2024-04-22 LAB — CULTURE, BLOOD (ROUTINE X 2)
Culture: NO GROWTH
Culture: NO GROWTH
Special Requests: ADEQUATE
Special Requests: ADEQUATE

## 2024-04-25 ENCOUNTER — Other Ambulatory Visit (HOSPITAL_COMMUNITY): Payer: Self-pay

## 2024-04-25 MED ORDER — OXYCODONE HCL 15 MG PO TABS
15.0000 mg | ORAL_TABLET | Freq: Four times a day (QID) | ORAL | 0 refills | Status: AC | PRN
  Filled 2024-04-25: qty 120, 30d supply, fill #0

## 2024-04-26 ENCOUNTER — Other Ambulatory Visit (HOSPITAL_COMMUNITY): Payer: Self-pay

## 2024-04-27 ENCOUNTER — Other Ambulatory Visit (HOSPITAL_COMMUNITY): Payer: Self-pay

## 2024-04-27 ENCOUNTER — Other Ambulatory Visit: Payer: Self-pay

## 2024-04-27 ENCOUNTER — Encounter: Payer: Self-pay | Admitting: Internal Medicine

## 2024-05-06 ENCOUNTER — Other Ambulatory Visit (HOSPITAL_COMMUNITY): Payer: Self-pay

## 2024-05-06 ENCOUNTER — Other Ambulatory Visit: Payer: Self-pay

## 2024-05-06 MED ORDER — MONISTAT 1 DAY OR NIGHT 1200 & 2 MG & % VA KIT
1.0000 | PACK | Freq: Every day | VAGINAL | 0 refills | Status: AC
  Filled 2024-05-06: qty 1, 7d supply, fill #0

## 2024-05-06 MED ORDER — IBANDRONATE SODIUM 150 MG PO TABS
150.0000 mg | ORAL_TABLET | ORAL | 1 refills | Status: AC
  Filled 2024-05-06: qty 6, 180d supply, fill #0

## 2024-05-08 ENCOUNTER — Other Ambulatory Visit (HOSPITAL_COMMUNITY): Payer: Self-pay

## 2024-05-09 ENCOUNTER — Other Ambulatory Visit (HOSPITAL_COMMUNITY): Payer: Self-pay

## 2024-05-18 ENCOUNTER — Other Ambulatory Visit (HOSPITAL_COMMUNITY): Payer: Self-pay

## 2024-05-19 ENCOUNTER — Other Ambulatory Visit (HOSPITAL_COMMUNITY): Payer: Self-pay

## 2024-05-19 MED ORDER — ONDANSETRON 8 MG PO TBDP
8.0000 mg | ORAL_TABLET | Freq: Three times a day (TID) | ORAL | 3 refills | Status: AC
  Filled 2024-05-19: qty 45, 15d supply, fill #0

## 2024-05-20 ENCOUNTER — Other Ambulatory Visit (HOSPITAL_COMMUNITY): Payer: Self-pay

## 2024-05-25 ENCOUNTER — Other Ambulatory Visit (HOSPITAL_COMMUNITY): Payer: Self-pay

## 2024-05-28 ENCOUNTER — Encounter: Payer: Self-pay | Admitting: Internal Medicine

## 2024-05-28 ENCOUNTER — Other Ambulatory Visit (HOSPITAL_COMMUNITY): Payer: Self-pay

## 2024-05-28 MED ORDER — TIZANIDINE HCL 4 MG PO TABS
4.0000 mg | ORAL_TABLET | Freq: Four times a day (QID) | ORAL | 5 refills | Status: AC | PRN
  Filled 2024-05-28 (×2): qty 120, 30d supply, fill #0

## 2024-05-28 MED ORDER — OXYCODONE HCL 15 MG PO TABS
15.0000 mg | ORAL_TABLET | Freq: Four times a day (QID) | ORAL | 0 refills | Status: AC | PRN
  Filled 2024-05-28: qty 120, 30d supply, fill #0

## 2024-05-28 MED ORDER — DULOXETINE HCL 60 MG PO CPEP: 60.0000 mg | ORAL_CAPSULE | Freq: Two times a day (BID) | ORAL | 1 refills | Status: AC

## 2024-05-28 MED ORDER — NALOXONE HCL 4 MG/0.1ML NA LIQD
1.0000 | NASAL | 3 refills | Status: AC
  Filled 2024-05-28: qty 2, 1d supply, fill #0

## 2024-05-28 MED ORDER — PREGABALIN 50 MG PO CAPS
50.0000 mg | ORAL_CAPSULE | Freq: Two times a day (BID) | ORAL | 5 refills | Status: AC
  Filled 2024-05-28: qty 60, 30d supply, fill #0
